# Patient Record
Sex: Female | Born: 1955
Health system: Southern US, Community
[De-identification: ages and names within clinical notes are randomized; demographics above are authoritative.]

## PROBLEM LIST (undated history)

## (undated) DIAGNOSIS — R319 Hematuria, unspecified: Secondary | ICD-10-CM

## (undated) DIAGNOSIS — J45909 Unspecified asthma, uncomplicated: Secondary | ICD-10-CM

## (undated) DIAGNOSIS — E119 Type 2 diabetes mellitus without complications: Secondary | ICD-10-CM

## (undated) DIAGNOSIS — R011 Cardiac murmur, unspecified: Secondary | ICD-10-CM

## (undated) DIAGNOSIS — I447 Left bundle-branch block, unspecified: Secondary | ICD-10-CM

## (undated) DIAGNOSIS — R0609 Other forms of dyspnea: Secondary | ICD-10-CM

## (undated) DIAGNOSIS — M5010 Cervical disc disorder with radiculopathy, unspecified cervical region: Secondary | ICD-10-CM

## (undated) DIAGNOSIS — B001 Herpesviral vesicular dermatitis: Secondary | ICD-10-CM

## (undated) DIAGNOSIS — Z87442 Personal history of urinary calculi: Secondary | ICD-10-CM

## (undated) DIAGNOSIS — M1990 Unspecified osteoarthritis, unspecified site: Secondary | ICD-10-CM

## (undated) DIAGNOSIS — K449 Diaphragmatic hernia without obstruction or gangrene: Secondary | ICD-10-CM

## (undated) DIAGNOSIS — I499 Cardiac arrhythmia, unspecified: Secondary | ICD-10-CM

## (undated) DIAGNOSIS — G2581 Restless legs syndrome: Secondary | ICD-10-CM

## (undated) DIAGNOSIS — F419 Anxiety disorder, unspecified: Secondary | ICD-10-CM

## (undated) DIAGNOSIS — G4700 Insomnia, unspecified: Secondary | ICD-10-CM

## (undated) DIAGNOSIS — M4802 Spinal stenosis, cervical region: Secondary | ICD-10-CM

## (undated) DIAGNOSIS — K219 Gastro-esophageal reflux disease without esophagitis: Secondary | ICD-10-CM

## (undated) DIAGNOSIS — J449 Chronic obstructive pulmonary disease, unspecified: Secondary | ICD-10-CM

## (undated) DIAGNOSIS — B373 Candidiasis of vulva and vagina: Secondary | ICD-10-CM

## (undated) DIAGNOSIS — F32A Depression, unspecified: Secondary | ICD-10-CM

## (undated) DIAGNOSIS — F1190 Opioid use, unspecified, uncomplicated: Secondary | ICD-10-CM

## (undated) DIAGNOSIS — G992 Myelopathy in diseases classified elsewhere: Secondary | ICD-10-CM

## (undated) DIAGNOSIS — G43909 Migraine, unspecified, not intractable, without status migrainosus: Secondary | ICD-10-CM

## (undated) DIAGNOSIS — R109 Unspecified abdominal pain: Secondary | ICD-10-CM

## (undated) DIAGNOSIS — M8580 Other specified disorders of bone density and structure, unspecified site: Secondary | ICD-10-CM

## (undated) DIAGNOSIS — R002 Palpitations: Secondary | ICD-10-CM

## (undated) DIAGNOSIS — B3731 Acute candidiasis of vulva and vagina: Secondary | ICD-10-CM

## (undated) DIAGNOSIS — G894 Chronic pain syndrome: Secondary | ICD-10-CM

## (undated) DIAGNOSIS — E162 Hypoglycemia, unspecified: Secondary | ICD-10-CM

## (undated) DIAGNOSIS — R7303 Prediabetes: Secondary | ICD-10-CM

## (undated) DIAGNOSIS — I10 Essential (primary) hypertension: Secondary | ICD-10-CM

## (undated) DIAGNOSIS — D649 Anemia, unspecified: Secondary | ICD-10-CM

## (undated) DIAGNOSIS — F329 Major depressive disorder, single episode, unspecified: Secondary | ICD-10-CM

## (undated) DIAGNOSIS — N200 Calculus of kidney: Secondary | ICD-10-CM

## (undated) HISTORY — DX: Unspecified abdominal pain: R10.9

## (undated) HISTORY — PX: LITHOTRIPSY: SUR834

## (undated) HISTORY — PX: BREAST SURGERY: SHX581

## (undated) HISTORY — DX: Hematuria, unspecified: R31.9

## (undated) HISTORY — DX: Depression, unspecified: F32.A

## (undated) HISTORY — PX: URETEROSCOPY: SHX842

## (undated) HISTORY — PX: GUM SURGERY: SHX658

## (undated) HISTORY — DX: Gastro-esophageal reflux disease without esophagitis: K21.9

## (undated) HISTORY — PX: TONSILLECTOMY: SUR1361

## (undated) HISTORY — DX: Candidiasis of vulva and vagina: B37.3

## (undated) HISTORY — DX: Major depressive disorder, single episode, unspecified: F32.9

## (undated) HISTORY — PX: JOINT REPLACEMENT: SHX530

## (undated) HISTORY — DX: Acute candidiasis of vulva and vagina: B37.31

## (undated) HISTORY — DX: Type 2 diabetes mellitus without complications: E11.9

## (undated) HISTORY — DX: Cardiac arrhythmia, unspecified: I49.9

## (undated) HISTORY — PX: ABDOMINAL HYSTERECTOMY: SHX81

## (undated) HISTORY — PX: FRACTURE SURGERY: SHX138

## (undated) HISTORY — PX: CARDIAC ELECTROPHYSIOLOGY STUDY AND ABLATION: SHX1294

## (undated) HISTORY — PX: BACK SURGERY: SHX140

## (undated) HISTORY — PX: LAPAROSCOPIC HYSTERECTOMY: SHX1926

## (undated) HISTORY — PX: CORNEAL TRANSPLANT: SHX108

---

## 1988-03-23 HISTORY — PX: APPENDECTOMY: SHX54

## 1988-03-23 HISTORY — PX: CHOLECYSTECTOMY: SHX55

## 1997-08-02 ENCOUNTER — Emergency Department (HOSPITAL_COMMUNITY): Admission: EM | Admit: 1997-08-02 | Discharge: 1997-08-02 | Payer: Self-pay | Admitting: Emergency Medicine

## 1999-05-13 ENCOUNTER — Inpatient Hospital Stay (HOSPITAL_COMMUNITY): Admission: AD | Admit: 1999-05-13 | Discharge: 1999-05-21 | Payer: Self-pay | Admitting: *Deleted

## 2000-10-03 ENCOUNTER — Emergency Department (HOSPITAL_COMMUNITY): Admission: EM | Admit: 2000-10-03 | Discharge: 2000-10-03 | Payer: Self-pay | Admitting: Emergency Medicine

## 2003-12-22 ENCOUNTER — Ambulatory Visit: Payer: Self-pay | Admitting: Internal Medicine

## 2003-12-31 ENCOUNTER — Emergency Department: Payer: Self-pay | Admitting: Emergency Medicine

## 2003-12-31 ENCOUNTER — Other Ambulatory Visit: Payer: Self-pay

## 2004-01-15 ENCOUNTER — Ambulatory Visit: Payer: Self-pay

## 2004-01-22 ENCOUNTER — Ambulatory Visit: Payer: Self-pay | Admitting: Internal Medicine

## 2004-06-10 ENCOUNTER — Ambulatory Visit: Payer: Self-pay | Admitting: Unknown Physician Specialty

## 2004-07-09 ENCOUNTER — Ambulatory Visit: Payer: Self-pay

## 2004-08-14 ENCOUNTER — Ambulatory Visit: Payer: Self-pay

## 2004-09-03 ENCOUNTER — Ambulatory Visit: Payer: Self-pay

## 2004-09-05 ENCOUNTER — Ambulatory Visit: Payer: Self-pay

## 2004-11-28 ENCOUNTER — Ambulatory Visit: Payer: Self-pay | Admitting: Pain Medicine

## 2004-12-02 ENCOUNTER — Ambulatory Visit: Payer: Self-pay | Admitting: Pain Medicine

## 2004-12-17 ENCOUNTER — Ambulatory Visit: Payer: Self-pay | Admitting: Physician Assistant

## 2004-12-23 ENCOUNTER — Ambulatory Visit: Payer: Self-pay | Admitting: Pain Medicine

## 2005-01-06 ENCOUNTER — Ambulatory Visit: Payer: Self-pay | Admitting: Pain Medicine

## 2005-01-21 ENCOUNTER — Ambulatory Visit: Payer: Self-pay | Admitting: Physician Assistant

## 2005-01-27 ENCOUNTER — Ambulatory Visit: Payer: Self-pay | Admitting: Pain Medicine

## 2005-02-16 ENCOUNTER — Ambulatory Visit: Payer: Self-pay | Admitting: Physician Assistant

## 2005-04-01 ENCOUNTER — Ambulatory Visit: Payer: Self-pay | Admitting: Physician Assistant

## 2005-04-03 ENCOUNTER — Ambulatory Visit: Payer: Self-pay | Admitting: Pain Medicine

## 2005-04-26 ENCOUNTER — Emergency Department: Payer: Self-pay | Admitting: Internal Medicine

## 2005-04-27 ENCOUNTER — Emergency Department: Payer: Self-pay | Admitting: Unknown Physician Specialty

## 2005-05-14 ENCOUNTER — Ambulatory Visit: Payer: Self-pay | Admitting: Physician Assistant

## 2005-05-21 ENCOUNTER — Ambulatory Visit: Payer: Self-pay | Admitting: Pain Medicine

## 2005-06-10 ENCOUNTER — Ambulatory Visit: Payer: Self-pay | Admitting: Physician Assistant

## 2005-06-15 ENCOUNTER — Emergency Department: Payer: Self-pay | Admitting: Unknown Physician Specialty

## 2005-06-24 ENCOUNTER — Ambulatory Visit: Payer: Self-pay | Admitting: Internal Medicine

## 2005-07-15 ENCOUNTER — Other Ambulatory Visit: Payer: Self-pay

## 2005-07-15 ENCOUNTER — Ambulatory Visit: Payer: Self-pay | Admitting: Urology

## 2005-07-16 ENCOUNTER — Ambulatory Visit: Payer: Self-pay | Admitting: Urology

## 2005-07-27 ENCOUNTER — Ambulatory Visit: Payer: Self-pay | Admitting: Urology

## 2005-07-30 ENCOUNTER — Ambulatory Visit: Payer: Self-pay | Admitting: Urology

## 2005-09-08 ENCOUNTER — Ambulatory Visit: Payer: Self-pay | Admitting: Urology

## 2005-10-13 ENCOUNTER — Ambulatory Visit: Payer: Self-pay

## 2005-11-02 ENCOUNTER — Inpatient Hospital Stay: Payer: Self-pay | Admitting: Internal Medicine

## 2005-11-25 ENCOUNTER — Ambulatory Visit: Payer: Self-pay

## 2005-12-02 ENCOUNTER — Ambulatory Visit: Payer: Self-pay | Admitting: Physician Assistant

## 2006-01-19 ENCOUNTER — Ambulatory Visit: Payer: Self-pay | Admitting: Physician Assistant

## 2006-02-15 ENCOUNTER — Ambulatory Visit: Payer: Self-pay | Admitting: Physician Assistant

## 2006-04-16 ENCOUNTER — Ambulatory Visit: Payer: Self-pay | Admitting: Internal Medicine

## 2006-05-25 ENCOUNTER — Ambulatory Visit: Payer: Self-pay | Admitting: Physician Assistant

## 2006-06-08 ENCOUNTER — Ambulatory Visit: Payer: Self-pay | Admitting: Pain Medicine

## 2006-06-23 ENCOUNTER — Ambulatory Visit: Payer: Self-pay | Admitting: Physician Assistant

## 2006-06-27 ENCOUNTER — Emergency Department: Payer: Self-pay

## 2006-06-27 ENCOUNTER — Other Ambulatory Visit: Payer: Self-pay

## 2006-08-17 ENCOUNTER — Ambulatory Visit: Payer: Self-pay | Admitting: Physician Assistant

## 2006-09-14 ENCOUNTER — Ambulatory Visit: Payer: Self-pay | Admitting: Physician Assistant

## 2006-10-29 ENCOUNTER — Ambulatory Visit: Payer: Self-pay | Admitting: Physician Assistant

## 2006-11-25 ENCOUNTER — Ambulatory Visit: Payer: Self-pay | Admitting: Physician Assistant

## 2006-12-28 ENCOUNTER — Ambulatory Visit: Payer: Self-pay | Admitting: Pain Medicine

## 2007-01-12 ENCOUNTER — Ambulatory Visit: Payer: Self-pay | Admitting: Internal Medicine

## 2007-02-14 ENCOUNTER — Ambulatory Visit: Payer: Self-pay | Admitting: Physician Assistant

## 2007-03-02 ENCOUNTER — Ambulatory Visit: Payer: Self-pay | Admitting: Urology

## 2007-03-09 ENCOUNTER — Ambulatory Visit: Payer: Self-pay | Admitting: Urology

## 2007-03-09 ENCOUNTER — Other Ambulatory Visit: Payer: Self-pay

## 2007-03-10 ENCOUNTER — Ambulatory Visit: Payer: Self-pay | Admitting: Urology

## 2007-06-08 ENCOUNTER — Other Ambulatory Visit: Payer: Self-pay

## 2007-06-08 ENCOUNTER — Ambulatory Visit: Payer: Self-pay | Admitting: Urology

## 2007-06-09 ENCOUNTER — Ambulatory Visit: Payer: Self-pay | Admitting: Urology

## 2007-08-31 ENCOUNTER — Ambulatory Visit: Payer: Self-pay | Admitting: Urology

## 2008-02-02 ENCOUNTER — Ambulatory Visit: Payer: Self-pay | Admitting: Internal Medicine

## 2008-02-14 ENCOUNTER — Ambulatory Visit: Payer: Self-pay | Admitting: Internal Medicine

## 2008-04-20 ENCOUNTER — Ambulatory Visit: Payer: Self-pay | Admitting: Specialist

## 2008-05-09 ENCOUNTER — Ambulatory Visit: Payer: Self-pay | Admitting: Urology

## 2008-05-28 ENCOUNTER — Ambulatory Visit: Payer: Self-pay | Admitting: Urology

## 2008-06-06 ENCOUNTER — Inpatient Hospital Stay: Payer: Self-pay | Admitting: Internal Medicine

## 2008-07-02 ENCOUNTER — Ambulatory Visit: Payer: Self-pay | Admitting: Urology

## 2008-07-12 ENCOUNTER — Ambulatory Visit: Payer: Self-pay | Admitting: Gastroenterology

## 2008-11-17 ENCOUNTER — Emergency Department: Payer: Self-pay | Admitting: Unknown Physician Specialty

## 2008-11-24 ENCOUNTER — Emergency Department: Payer: Self-pay | Admitting: Emergency Medicine

## 2008-11-25 ENCOUNTER — Emergency Department: Payer: Self-pay | Admitting: Emergency Medicine

## 2009-01-22 ENCOUNTER — Ambulatory Visit: Payer: Self-pay | Admitting: Urology

## 2009-06-14 ENCOUNTER — Emergency Department: Payer: Self-pay | Admitting: Emergency Medicine

## 2009-07-01 ENCOUNTER — Emergency Department: Payer: Self-pay | Admitting: Emergency Medicine

## 2009-07-08 ENCOUNTER — Emergency Department: Payer: Self-pay

## 2009-08-26 ENCOUNTER — Emergency Department: Payer: Self-pay | Admitting: Emergency Medicine

## 2009-09-19 ENCOUNTER — Emergency Department: Payer: Self-pay | Admitting: Emergency Medicine

## 2009-10-06 ENCOUNTER — Emergency Department: Payer: Self-pay | Admitting: Emergency Medicine

## 2009-10-29 ENCOUNTER — Ambulatory Visit: Payer: Self-pay | Admitting: Internal Medicine

## 2009-12-13 ENCOUNTER — Ambulatory Visit: Payer: Self-pay | Admitting: Neurology

## 2010-02-04 ENCOUNTER — Ambulatory Visit: Payer: Self-pay | Admitting: Internal Medicine

## 2010-02-11 ENCOUNTER — Emergency Department: Payer: Self-pay

## 2010-03-24 ENCOUNTER — Observation Stay: Payer: Self-pay | Admitting: Internal Medicine

## 2010-03-25 ENCOUNTER — Observation Stay: Payer: Self-pay | Admitting: Internal Medicine

## 2010-03-29 ENCOUNTER — Emergency Department: Payer: Self-pay | Admitting: Emergency Medicine

## 2010-05-11 ENCOUNTER — Emergency Department: Payer: Self-pay | Admitting: Emergency Medicine

## 2010-07-07 DIAGNOSIS — K2270 Barrett's esophagus without dysplasia: Secondary | ICD-10-CM | POA: Insufficient documentation

## 2010-07-30 ENCOUNTER — Emergency Department: Payer: Self-pay | Admitting: Emergency Medicine

## 2010-08-06 ENCOUNTER — Emergency Department: Payer: Self-pay | Admitting: Emergency Medicine

## 2010-10-01 ENCOUNTER — Emergency Department: Payer: Self-pay | Admitting: Emergency Medicine

## 2010-10-08 ENCOUNTER — Ambulatory Visit: Payer: Self-pay | Admitting: Internal Medicine

## 2010-10-27 DIAGNOSIS — K449 Diaphragmatic hernia without obstruction or gangrene: Secondary | ICD-10-CM | POA: Insufficient documentation

## 2010-12-27 ENCOUNTER — Emergency Department: Payer: Self-pay | Admitting: Emergency Medicine

## 2011-01-11 ENCOUNTER — Emergency Department: Payer: Self-pay | Admitting: Unknown Physician Specialty

## 2011-01-14 ENCOUNTER — Emergency Department: Payer: Self-pay | Admitting: Internal Medicine

## 2011-01-20 ENCOUNTER — Ambulatory Visit: Payer: Self-pay | Admitting: Urology

## 2011-02-19 ENCOUNTER — Emergency Department: Payer: Self-pay | Admitting: Internal Medicine

## 2011-03-30 DIAGNOSIS — S82009A Unspecified fracture of unspecified patella, initial encounter for closed fracture: Secondary | ICD-10-CM | POA: Diagnosis not present

## 2011-03-30 DIAGNOSIS — M1710 Unilateral primary osteoarthritis, unspecified knee: Secondary | ICD-10-CM | POA: Diagnosis not present

## 2011-04-01 DIAGNOSIS — M1710 Unilateral primary osteoarthritis, unspecified knee: Secondary | ICD-10-CM | POA: Diagnosis not present

## 2011-04-03 DIAGNOSIS — S82009A Unspecified fracture of unspecified patella, initial encounter for closed fracture: Secondary | ICD-10-CM | POA: Diagnosis not present

## 2011-04-03 DIAGNOSIS — M1710 Unilateral primary osteoarthritis, unspecified knee: Secondary | ICD-10-CM | POA: Diagnosis not present

## 2011-04-06 DIAGNOSIS — M1710 Unilateral primary osteoarthritis, unspecified knee: Secondary | ICD-10-CM | POA: Diagnosis not present

## 2011-04-06 DIAGNOSIS — S82009A Unspecified fracture of unspecified patella, initial encounter for closed fracture: Secondary | ICD-10-CM | POA: Diagnosis not present

## 2011-04-08 DIAGNOSIS — M1710 Unilateral primary osteoarthritis, unspecified knee: Secondary | ICD-10-CM | POA: Diagnosis not present

## 2011-04-08 DIAGNOSIS — S82009A Unspecified fracture of unspecified patella, initial encounter for closed fracture: Secondary | ICD-10-CM | POA: Diagnosis not present

## 2011-04-10 DIAGNOSIS — M1710 Unilateral primary osteoarthritis, unspecified knee: Secondary | ICD-10-CM | POA: Diagnosis not present

## 2011-04-15 DIAGNOSIS — M1710 Unilateral primary osteoarthritis, unspecified knee: Secondary | ICD-10-CM | POA: Diagnosis not present

## 2011-04-20 DIAGNOSIS — S82009A Unspecified fracture of unspecified patella, initial encounter for closed fracture: Secondary | ICD-10-CM | POA: Diagnosis not present

## 2011-04-20 DIAGNOSIS — M1710 Unilateral primary osteoarthritis, unspecified knee: Secondary | ICD-10-CM | POA: Diagnosis not present

## 2011-04-24 DIAGNOSIS — M1710 Unilateral primary osteoarthritis, unspecified knee: Secondary | ICD-10-CM | POA: Diagnosis not present

## 2011-04-24 DIAGNOSIS — S82009A Unspecified fracture of unspecified patella, initial encounter for closed fracture: Secondary | ICD-10-CM | POA: Diagnosis not present

## 2011-04-27 DIAGNOSIS — M1710 Unilateral primary osteoarthritis, unspecified knee: Secondary | ICD-10-CM | POA: Diagnosis not present

## 2011-04-27 DIAGNOSIS — S82009A Unspecified fracture of unspecified patella, initial encounter for closed fracture: Secondary | ICD-10-CM | POA: Diagnosis not present

## 2011-04-28 DIAGNOSIS — F411 Generalized anxiety disorder: Secondary | ICD-10-CM | POA: Diagnosis not present

## 2011-04-28 DIAGNOSIS — M542 Cervicalgia: Secondary | ICD-10-CM | POA: Diagnosis not present

## 2011-04-28 DIAGNOSIS — E559 Vitamin D deficiency, unspecified: Secondary | ICD-10-CM | POA: Diagnosis not present

## 2011-04-28 DIAGNOSIS — G43009 Migraine without aura, not intractable, without status migrainosus: Secondary | ICD-10-CM | POA: Diagnosis not present

## 2011-04-28 DIAGNOSIS — E782 Mixed hyperlipidemia: Secondary | ICD-10-CM | POA: Diagnosis not present

## 2011-04-28 DIAGNOSIS — I479 Paroxysmal tachycardia, unspecified: Secondary | ICD-10-CM | POA: Diagnosis not present

## 2011-04-28 DIAGNOSIS — I10 Essential (primary) hypertension: Secondary | ICD-10-CM | POA: Diagnosis not present

## 2011-06-04 DIAGNOSIS — E782 Mixed hyperlipidemia: Secondary | ICD-10-CM | POA: Diagnosis not present

## 2011-06-04 DIAGNOSIS — F5102 Adjustment insomnia: Secondary | ICD-10-CM | POA: Diagnosis not present

## 2011-06-04 DIAGNOSIS — M159 Polyosteoarthritis, unspecified: Secondary | ICD-10-CM | POA: Diagnosis not present

## 2011-06-04 DIAGNOSIS — F411 Generalized anxiety disorder: Secondary | ICD-10-CM | POA: Diagnosis not present

## 2011-06-04 DIAGNOSIS — G479 Sleep disorder, unspecified: Secondary | ICD-10-CM | POA: Diagnosis not present

## 2011-06-10 DIAGNOSIS — R062 Wheezing: Secondary | ICD-10-CM | POA: Diagnosis not present

## 2011-06-10 DIAGNOSIS — R0602 Shortness of breath: Secondary | ICD-10-CM | POA: Diagnosis not present

## 2011-06-10 DIAGNOSIS — J441 Chronic obstructive pulmonary disease with (acute) exacerbation: Secondary | ICD-10-CM | POA: Diagnosis not present

## 2011-06-10 DIAGNOSIS — J45901 Unspecified asthma with (acute) exacerbation: Secondary | ICD-10-CM | POA: Diagnosis not present

## 2011-06-29 DIAGNOSIS — K449 Diaphragmatic hernia without obstruction or gangrene: Secondary | ICD-10-CM | POA: Diagnosis not present

## 2011-06-29 DIAGNOSIS — K219 Gastro-esophageal reflux disease without esophagitis: Secondary | ICD-10-CM | POA: Diagnosis not present

## 2011-06-29 DIAGNOSIS — Z09 Encounter for follow-up examination after completed treatment for conditions other than malignant neoplasm: Secondary | ICD-10-CM | POA: Diagnosis not present

## 2011-07-06 DIAGNOSIS — G43009 Migraine without aura, not intractable, without status migrainosus: Secondary | ICD-10-CM | POA: Diagnosis not present

## 2011-07-06 DIAGNOSIS — K2270 Barrett's esophagus without dysplasia: Secondary | ICD-10-CM | POA: Diagnosis not present

## 2011-07-06 DIAGNOSIS — R5381 Other malaise: Secondary | ICD-10-CM | POA: Diagnosis not present

## 2011-07-06 DIAGNOSIS — J449 Chronic obstructive pulmonary disease, unspecified: Secondary | ICD-10-CM | POA: Diagnosis not present

## 2011-07-06 DIAGNOSIS — F3289 Other specified depressive episodes: Secondary | ICD-10-CM | POA: Diagnosis not present

## 2011-07-06 DIAGNOSIS — I10 Essential (primary) hypertension: Secondary | ICD-10-CM | POA: Diagnosis not present

## 2011-07-06 DIAGNOSIS — E559 Vitamin D deficiency, unspecified: Secondary | ICD-10-CM | POA: Diagnosis not present

## 2011-07-06 DIAGNOSIS — F329 Major depressive disorder, single episode, unspecified: Secondary | ICD-10-CM | POA: Diagnosis not present

## 2011-08-31 DIAGNOSIS — M1710 Unilateral primary osteoarthritis, unspecified knee: Secondary | ICD-10-CM | POA: Diagnosis not present

## 2011-09-02 DIAGNOSIS — R300 Dysuria: Secondary | ICD-10-CM | POA: Diagnosis not present

## 2011-09-02 DIAGNOSIS — N390 Urinary tract infection, site not specified: Secondary | ICD-10-CM | POA: Diagnosis not present

## 2011-09-30 DIAGNOSIS — M1710 Unilateral primary osteoarthritis, unspecified knee: Secondary | ICD-10-CM | POA: Diagnosis not present

## 2011-09-30 DIAGNOSIS — IMO0002 Reserved for concepts with insufficient information to code with codable children: Secondary | ICD-10-CM | POA: Diagnosis not present

## 2011-10-01 ENCOUNTER — Ambulatory Visit: Payer: Self-pay | Admitting: Orthopedic Surgery

## 2011-10-01 DIAGNOSIS — M25469 Effusion, unspecified knee: Secondary | ICD-10-CM | POA: Diagnosis not present

## 2011-10-01 DIAGNOSIS — M2390 Unspecified internal derangement of unspecified knee: Secondary | ICD-10-CM | POA: Diagnosis not present

## 2011-10-01 DIAGNOSIS — M25569 Pain in unspecified knee: Secondary | ICD-10-CM | POA: Diagnosis not present

## 2011-10-20 ENCOUNTER — Ambulatory Visit: Payer: Self-pay | Admitting: Orthopedic Surgery

## 2011-10-20 DIAGNOSIS — I10 Essential (primary) hypertension: Secondary | ICD-10-CM | POA: Diagnosis not present

## 2011-10-20 LAB — BASIC METABOLIC PANEL
Anion Gap: 8 (ref 7–16)
BUN: 11 mg/dL (ref 7–18)
Calcium, Total: 8.9 mg/dL (ref 8.5–10.1)
Chloride: 103 mmol/L (ref 98–107)
Co2: 31 mmol/L (ref 21–32)
Creatinine: 0.56 mg/dL — ABNORMAL LOW (ref 0.60–1.30)
EGFR (African American): >60
EGFR (Non-African Amer.): >60
Glucose: 78 mg/dL (ref 65–99)
Osmolality: 281 (ref 275–301)
Potassium: 3.7 mmol/L (ref 3.5–5.1)
Sodium: 142 mmol/L (ref 136–145)

## 2011-10-20 LAB — APTT: Activated PTT: 28.8 secs (ref 23.6–35.9)

## 2011-10-20 LAB — MRSA PCR SCREENING

## 2011-10-20 LAB — PROTIME-INR
INR: 0.9
Prothrombin Time: 12.6 secs (ref 11.5–14.7)

## 2011-10-26 ENCOUNTER — Ambulatory Visit: Payer: Self-pay | Admitting: Anesthesiology

## 2011-10-26 LAB — CBC
HCT: 36.5 % (ref 35.0–47.0)
HGB: 12.1 g/dL (ref 12.0–16.0)
MCH: 28.5 pg (ref 26.0–34.0)
MCHC: 33 g/dL (ref 32.0–36.0)
MCV: 86 fL (ref 80–100)
Platelet: 267 10*3/uL (ref 150–440)
RBC: 4.23 10*6/uL (ref 3.80–5.20)
RDW: 14.6 % — ABNORMAL HIGH (ref 11.5–14.5)
WBC: 11.2 10*3/uL — ABNORMAL HIGH (ref 3.6–11.0)

## 2011-10-26 LAB — SEDIMENTATION RATE: Erythrocyte Sed Rate: 8 mm/hr (ref 0–30)

## 2011-10-29 ENCOUNTER — Inpatient Hospital Stay: Payer: Self-pay

## 2011-10-29 DIAGNOSIS — Z79899 Other long term (current) drug therapy: Secondary | ICD-10-CM | POA: Diagnosis not present

## 2011-10-29 DIAGNOSIS — G2581 Restless legs syndrome: Secondary | ICD-10-CM | POA: Diagnosis present

## 2011-10-29 DIAGNOSIS — M810 Age-related osteoporosis without current pathological fracture: Secondary | ICD-10-CM | POA: Diagnosis present

## 2011-10-29 DIAGNOSIS — D62 Acute posthemorrhagic anemia: Secondary | ICD-10-CM | POA: Diagnosis not present

## 2011-10-29 DIAGNOSIS — F329 Major depressive disorder, single episode, unspecified: Secondary | ICD-10-CM | POA: Diagnosis present

## 2011-10-29 DIAGNOSIS — G43909 Migraine, unspecified, not intractable, without status migrainosus: Secondary | ICD-10-CM | POA: Diagnosis present

## 2011-10-29 DIAGNOSIS — Z5189 Encounter for other specified aftercare: Secondary | ICD-10-CM | POA: Diagnosis not present

## 2011-10-29 DIAGNOSIS — Z87442 Personal history of urinary calculi: Secondary | ICD-10-CM | POA: Diagnosis not present

## 2011-10-29 DIAGNOSIS — M25569 Pain in unspecified knee: Secondary | ICD-10-CM | POA: Diagnosis not present

## 2011-10-29 DIAGNOSIS — IMO0002 Reserved for concepts with insufficient information to code with codable children: Secondary | ICD-10-CM | POA: Diagnosis not present

## 2011-10-29 DIAGNOSIS — Z8614 Personal history of Methicillin resistant Staphylococcus aureus infection: Secondary | ICD-10-CM | POA: Diagnosis not present

## 2011-10-29 DIAGNOSIS — Z8619 Personal history of other infectious and parasitic diseases: Secondary | ICD-10-CM | POA: Diagnosis not present

## 2011-10-29 DIAGNOSIS — M1710 Unilateral primary osteoarthritis, unspecified knee: Secondary | ICD-10-CM | POA: Diagnosis not present

## 2011-10-29 DIAGNOSIS — Z881 Allergy status to other antibiotic agents status: Secondary | ICD-10-CM | POA: Diagnosis not present

## 2011-10-29 DIAGNOSIS — J45909 Unspecified asthma, uncomplicated: Secondary | ICD-10-CM | POA: Diagnosis present

## 2011-10-29 DIAGNOSIS — I10 Essential (primary) hypertension: Secondary | ICD-10-CM | POA: Diagnosis present

## 2011-10-29 DIAGNOSIS — F411 Generalized anxiety disorder: Secondary | ICD-10-CM | POA: Diagnosis present

## 2011-10-29 DIAGNOSIS — Z9889 Other specified postprocedural states: Secondary | ICD-10-CM | POA: Diagnosis not present

## 2011-10-29 DIAGNOSIS — Z888 Allergy status to other drugs, medicaments and biological substances status: Secondary | ICD-10-CM | POA: Diagnosis not present

## 2011-10-29 DIAGNOSIS — Z886 Allergy status to analgesic agent status: Secondary | ICD-10-CM | POA: Diagnosis not present

## 2011-10-29 DIAGNOSIS — M79609 Pain in unspecified limb: Secondary | ICD-10-CM | POA: Diagnosis not present

## 2011-10-29 DIAGNOSIS — G8918 Other acute postprocedural pain: Secondary | ICD-10-CM | POA: Diagnosis not present

## 2011-10-29 DIAGNOSIS — F3289 Other specified depressive episodes: Secondary | ICD-10-CM | POA: Diagnosis present

## 2011-10-29 DIAGNOSIS — R000 Tachycardia, unspecified: Secondary | ICD-10-CM | POA: Diagnosis not present

## 2011-10-29 DIAGNOSIS — Z471 Aftercare following joint replacement surgery: Secondary | ICD-10-CM | POA: Diagnosis not present

## 2011-10-29 DIAGNOSIS — M069 Rheumatoid arthritis, unspecified: Secondary | ICD-10-CM | POA: Diagnosis present

## 2011-10-29 DIAGNOSIS — Z96659 Presence of unspecified artificial knee joint: Secondary | ICD-10-CM | POA: Diagnosis not present

## 2011-10-29 LAB — CREATININE, SERUM
Creatinine: 0.63 mg/dL (ref 0.60–1.30)
EGFR (African American): >60
EGFR (Non-African Amer.): >60

## 2011-10-30 LAB — BASIC METABOLIC PANEL
Anion Gap: 7 (ref 7–16)
BUN: 10 mg/dL (ref 7–18)
Calcium, Total: 7.3 mg/dL — ABNORMAL LOW (ref 8.5–10.1)
Chloride: 104 mmol/L (ref 98–107)
Co2: 27 mmol/L (ref 21–32)
Creatinine: 0.64 mg/dL (ref 0.60–1.30)
EGFR (African American): >60
EGFR (Non-African Amer.): >60
Glucose: 105 mg/dL — ABNORMAL HIGH (ref 65–99)
Osmolality: 275 (ref 275–301)
Potassium: 3.4 mmol/L — ABNORMAL LOW (ref 3.5–5.1)
Sodium: 138 mmol/L (ref 136–145)

## 2011-10-30 LAB — HEMOGLOBIN: HGB: 9.4 g/dL — ABNORMAL LOW (ref 12.0–16.0)

## 2011-10-30 LAB — PLATELET COUNT: Platelet: 213 10*3/uL (ref 150–440)

## 2011-10-31 LAB — HEMOGLOBIN: HGB: 9.3 g/dL — ABNORMAL LOW (ref 12.0–16.0)

## 2011-11-02 DIAGNOSIS — Z79899 Other long term (current) drug therapy: Secondary | ICD-10-CM | POA: Diagnosis not present

## 2011-11-02 DIAGNOSIS — Z833 Family history of diabetes mellitus: Secondary | ICD-10-CM | POA: Diagnosis not present

## 2011-11-02 DIAGNOSIS — R000 Tachycardia, unspecified: Secondary | ICD-10-CM | POA: Diagnosis not present

## 2011-11-02 DIAGNOSIS — M069 Rheumatoid arthritis, unspecified: Secondary | ICD-10-CM | POA: Diagnosis not present

## 2011-11-02 DIAGNOSIS — Z90710 Acquired absence of both cervix and uterus: Secondary | ICD-10-CM | POA: Diagnosis not present

## 2011-11-02 DIAGNOSIS — G43909 Migraine, unspecified, not intractable, without status migrainosus: Secondary | ICD-10-CM | POA: Diagnosis not present

## 2011-11-02 DIAGNOSIS — E871 Hypo-osmolality and hyponatremia: Secondary | ICD-10-CM | POA: Diagnosis present

## 2011-11-02 DIAGNOSIS — R222 Localized swelling, mass and lump, trunk: Secondary | ICD-10-CM | POA: Diagnosis not present

## 2011-11-02 DIAGNOSIS — Z87442 Personal history of urinary calculi: Secondary | ICD-10-CM | POA: Diagnosis not present

## 2011-11-02 DIAGNOSIS — G43809 Other migraine, not intractable, without status migrainosus: Secondary | ICD-10-CM | POA: Diagnosis present

## 2011-11-02 DIAGNOSIS — Z91018 Allergy to other foods: Secondary | ICD-10-CM | POA: Diagnosis not present

## 2011-11-02 DIAGNOSIS — R5381 Other malaise: Secondary | ICD-10-CM | POA: Diagnosis present

## 2011-11-02 DIAGNOSIS — J45901 Unspecified asthma with (acute) exacerbation: Secondary | ICD-10-CM | POA: Diagnosis present

## 2011-11-02 DIAGNOSIS — E46 Unspecified protein-calorie malnutrition: Secondary | ICD-10-CM | POA: Diagnosis present

## 2011-11-02 DIAGNOSIS — Z96659 Presence of unspecified artificial knee joint: Secondary | ICD-10-CM | POA: Diagnosis not present

## 2011-11-02 DIAGNOSIS — K449 Diaphragmatic hernia without obstruction or gangrene: Secondary | ICD-10-CM | POA: Diagnosis not present

## 2011-11-02 DIAGNOSIS — K254 Chronic or unspecified gastric ulcer with hemorrhage: Secondary | ICD-10-CM | POA: Diagnosis present

## 2011-11-02 DIAGNOSIS — D649 Anemia, unspecified: Secondary | ICD-10-CM | POA: Diagnosis not present

## 2011-11-02 DIAGNOSIS — I10 Essential (primary) hypertension: Secondary | ICD-10-CM | POA: Diagnosis not present

## 2011-11-02 DIAGNOSIS — F411 Generalized anxiety disorder: Secondary | ICD-10-CM | POA: Diagnosis present

## 2011-11-02 DIAGNOSIS — F329 Major depressive disorder, single episode, unspecified: Secondary | ICD-10-CM | POA: Diagnosis present

## 2011-11-02 DIAGNOSIS — M810 Age-related osteoporosis without current pathological fracture: Secondary | ICD-10-CM | POA: Diagnosis not present

## 2011-11-02 DIAGNOSIS — M1710 Unilateral primary osteoarthritis, unspecified knee: Secondary | ICD-10-CM | POA: Diagnosis present

## 2011-11-02 DIAGNOSIS — R0609 Other forms of dyspnea: Secondary | ICD-10-CM | POA: Diagnosis not present

## 2011-11-02 DIAGNOSIS — R0989 Other specified symptoms and signs involving the circulatory and respiratory systems: Secondary | ICD-10-CM | POA: Diagnosis not present

## 2011-11-02 DIAGNOSIS — IMO0002 Reserved for concepts with insufficient information to code with codable children: Secondary | ICD-10-CM | POA: Diagnosis present

## 2011-11-02 DIAGNOSIS — D62 Acute posthemorrhagic anemia: Secondary | ICD-10-CM | POA: Diagnosis not present

## 2011-11-02 DIAGNOSIS — R509 Fever, unspecified: Secondary | ICD-10-CM | POA: Diagnosis present

## 2011-11-02 DIAGNOSIS — F3289 Other specified depressive episodes: Secondary | ICD-10-CM | POA: Diagnosis present

## 2011-11-02 DIAGNOSIS — R0602 Shortness of breath: Secondary | ICD-10-CM | POA: Diagnosis not present

## 2011-11-02 DIAGNOSIS — Z471 Aftercare following joint replacement surgery: Secondary | ICD-10-CM | POA: Diagnosis not present

## 2011-11-02 DIAGNOSIS — Z9889 Other specified postprocedural states: Secondary | ICD-10-CM | POA: Diagnosis not present

## 2011-11-02 DIAGNOSIS — Z8249 Family history of ischemic heart disease and other diseases of the circulatory system: Secondary | ICD-10-CM | POA: Diagnosis not present

## 2011-11-02 DIAGNOSIS — J189 Pneumonia, unspecified organism: Secondary | ICD-10-CM | POA: Diagnosis present

## 2011-11-02 DIAGNOSIS — R062 Wheezing: Secondary | ICD-10-CM | POA: Diagnosis not present

## 2011-11-02 DIAGNOSIS — N390 Urinary tract infection, site not specified: Secondary | ICD-10-CM | POA: Diagnosis present

## 2011-11-02 DIAGNOSIS — Z5189 Encounter for other specified aftercare: Secondary | ICD-10-CM | POA: Diagnosis not present

## 2011-11-02 DIAGNOSIS — Z888 Allergy status to other drugs, medicaments and biological substances status: Secondary | ICD-10-CM | POA: Diagnosis not present

## 2011-11-02 LAB — PATHOLOGY REPORT

## 2011-11-05 ENCOUNTER — Inpatient Hospital Stay: Payer: Self-pay | Admitting: Internal Medicine

## 2011-11-05 DIAGNOSIS — R000 Tachycardia, unspecified: Secondary | ICD-10-CM | POA: Diagnosis not present

## 2011-11-05 DIAGNOSIS — R0609 Other forms of dyspnea: Secondary | ICD-10-CM | POA: Diagnosis not present

## 2011-11-05 DIAGNOSIS — G43809 Other migraine, not intractable, without status migrainosus: Secondary | ICD-10-CM | POA: Diagnosis present

## 2011-11-05 DIAGNOSIS — Z471 Aftercare following joint replacement surgery: Secondary | ICD-10-CM | POA: Diagnosis not present

## 2011-11-05 DIAGNOSIS — IMO0002 Reserved for concepts with insufficient information to code with codable children: Secondary | ICD-10-CM | POA: Diagnosis present

## 2011-11-05 DIAGNOSIS — J189 Pneumonia, unspecified organism: Secondary | ICD-10-CM | POA: Diagnosis not present

## 2011-11-05 DIAGNOSIS — Z5189 Encounter for other specified aftercare: Secondary | ICD-10-CM | POA: Diagnosis not present

## 2011-11-05 DIAGNOSIS — D62 Acute posthemorrhagic anemia: Secondary | ICD-10-CM | POA: Diagnosis not present

## 2011-11-05 DIAGNOSIS — M810 Age-related osteoporosis without current pathological fracture: Secondary | ICD-10-CM | POA: Diagnosis not present

## 2011-11-05 DIAGNOSIS — Z79899 Other long term (current) drug therapy: Secondary | ICD-10-CM | POA: Diagnosis not present

## 2011-11-05 DIAGNOSIS — K922 Gastrointestinal hemorrhage, unspecified: Secondary | ICD-10-CM | POA: Diagnosis not present

## 2011-11-05 DIAGNOSIS — Z833 Family history of diabetes mellitus: Secondary | ICD-10-CM | POA: Diagnosis not present

## 2011-11-05 DIAGNOSIS — K2970 Gastritis, unspecified, without bleeding: Secondary | ICD-10-CM | POA: Diagnosis not present

## 2011-11-05 DIAGNOSIS — F3289 Other specified depressive episodes: Secondary | ICD-10-CM | POA: Diagnosis present

## 2011-11-05 DIAGNOSIS — J45901 Unspecified asthma with (acute) exacerbation: Secondary | ICD-10-CM | POA: Diagnosis present

## 2011-11-05 DIAGNOSIS — R918 Other nonspecific abnormal finding of lung field: Secondary | ICD-10-CM | POA: Diagnosis not present

## 2011-11-05 DIAGNOSIS — E871 Hypo-osmolality and hyponatremia: Secondary | ICD-10-CM | POA: Diagnosis present

## 2011-11-05 DIAGNOSIS — Z87442 Personal history of urinary calculi: Secondary | ICD-10-CM | POA: Diagnosis not present

## 2011-11-05 DIAGNOSIS — J45909 Unspecified asthma, uncomplicated: Secondary | ICD-10-CM | POA: Diagnosis not present

## 2011-11-05 DIAGNOSIS — K254 Chronic or unspecified gastric ulcer with hemorrhage: Secondary | ICD-10-CM | POA: Diagnosis present

## 2011-11-05 DIAGNOSIS — N390 Urinary tract infection, site not specified: Secondary | ICD-10-CM | POA: Diagnosis present

## 2011-11-05 DIAGNOSIS — Z9889 Other specified postprocedural states: Secondary | ICD-10-CM | POA: Diagnosis not present

## 2011-11-05 DIAGNOSIS — R195 Other fecal abnormalities: Secondary | ICD-10-CM | POA: Diagnosis not present

## 2011-11-05 DIAGNOSIS — E46 Unspecified protein-calorie malnutrition: Secondary | ICD-10-CM | POA: Diagnosis present

## 2011-11-05 DIAGNOSIS — K449 Diaphragmatic hernia without obstruction or gangrene: Secondary | ICD-10-CM | POA: Diagnosis not present

## 2011-11-05 DIAGNOSIS — R0602 Shortness of breath: Secondary | ICD-10-CM | POA: Diagnosis not present

## 2011-11-05 DIAGNOSIS — M25569 Pain in unspecified knee: Secondary | ICD-10-CM | POA: Diagnosis not present

## 2011-11-05 DIAGNOSIS — M1710 Unilateral primary osteoarthritis, unspecified knee: Secondary | ICD-10-CM | POA: Diagnosis present

## 2011-11-05 DIAGNOSIS — F411 Generalized anxiety disorder: Secondary | ICD-10-CM | POA: Diagnosis present

## 2011-11-05 DIAGNOSIS — Z91018 Allergy to other foods: Secondary | ICD-10-CM | POA: Diagnosis not present

## 2011-11-05 DIAGNOSIS — Z8249 Family history of ischemic heart disease and other diseases of the circulatory system: Secondary | ICD-10-CM | POA: Diagnosis not present

## 2011-11-05 DIAGNOSIS — K259 Gastric ulcer, unspecified as acute or chronic, without hemorrhage or perforation: Secondary | ICD-10-CM | POA: Diagnosis not present

## 2011-11-05 DIAGNOSIS — R222 Localized swelling, mass and lump, trunk: Secondary | ICD-10-CM | POA: Diagnosis not present

## 2011-11-05 DIAGNOSIS — F329 Major depressive disorder, single episode, unspecified: Secondary | ICD-10-CM | POA: Diagnosis present

## 2011-11-05 DIAGNOSIS — R062 Wheezing: Secondary | ICD-10-CM | POA: Diagnosis not present

## 2011-11-05 DIAGNOSIS — Z90710 Acquired absence of both cervix and uterus: Secondary | ICD-10-CM | POA: Diagnosis not present

## 2011-11-05 DIAGNOSIS — R509 Fever, unspecified: Secondary | ICD-10-CM | POA: Diagnosis present

## 2011-11-05 DIAGNOSIS — Z96659 Presence of unspecified artificial knee joint: Secondary | ICD-10-CM | POA: Diagnosis not present

## 2011-11-05 DIAGNOSIS — Z888 Allergy status to other drugs, medicaments and biological substances status: Secondary | ICD-10-CM | POA: Diagnosis not present

## 2011-11-05 DIAGNOSIS — R5383 Other fatigue: Secondary | ICD-10-CM | POA: Diagnosis not present

## 2011-11-05 DIAGNOSIS — R5381 Other malaise: Secondary | ICD-10-CM | POA: Diagnosis present

## 2011-11-05 DIAGNOSIS — I10 Essential (primary) hypertension: Secondary | ICD-10-CM | POA: Diagnosis not present

## 2011-11-05 DIAGNOSIS — D649 Anemia, unspecified: Secondary | ICD-10-CM | POA: Diagnosis not present

## 2011-11-05 LAB — COMPREHENSIVE METABOLIC PANEL
Albumin: 2.2 g/dL — ABNORMAL LOW (ref 3.4–5.0)
Alkaline Phosphatase: 100 U/L (ref 50–136)
Anion Gap: 6 — ABNORMAL LOW (ref 7–16)
BUN: 9 mg/dL (ref 7–18)
Bilirubin,Total: 0.7 mg/dL (ref 0.2–1.0)
Calcium, Total: 7.9 mg/dL — ABNORMAL LOW (ref 8.5–10.1)
Chloride: 92 mmol/L — ABNORMAL LOW (ref 98–107)
Co2: 33 mmol/L — ABNORMAL HIGH (ref 21–32)
Creatinine: 0.65 mg/dL (ref 0.60–1.30)
EGFR (African American): >60
EGFR (Non-African Amer.): >60
Glucose: 128 mg/dL — ABNORMAL HIGH (ref 65–99)
Osmolality: 263 (ref 275–301)
Potassium: 3.2 mmol/L — ABNORMAL LOW (ref 3.5–5.1)
SGOT(AST): 24 U/L (ref 15–37)
SGPT (ALT): 16 U/L (ref 12–78)
Sodium: 131 mmol/L — ABNORMAL LOW (ref 136–145)
Total Protein: 6 g/dL — ABNORMAL LOW (ref 6.4–8.2)

## 2011-11-05 LAB — URINALYSIS, COMPLETE
Bacteria: NONE SEEN
Bilirubin,UR: NEGATIVE
Blood: NEGATIVE
Glucose,UR: NEGATIVE mg/dL (ref 0–75)
Ketone: NEGATIVE
Nitrite: NEGATIVE
Ph: 5 (ref 4.5–8.0)
Protein: NEGATIVE
RBC,UR: 1 /HPF (ref 0–5)
Specific Gravity: 1.018 (ref 1.003–1.030)
Squamous Epithelial: 3
WBC UR: 8 /HPF (ref 0–5)

## 2011-11-05 LAB — MAGNESIUM: Magnesium: 1.6 mg/dL — ABNORMAL LOW

## 2011-11-05 LAB — CBC
HCT: 21.8 % — ABNORMAL LOW (ref 35.0–47.0)
HGB: 7.2 g/dL — ABNORMAL LOW (ref 12.0–16.0)
MCH: 28.7 pg (ref 26.0–34.0)
MCHC: 33.2 g/dL (ref 32.0–36.0)
MCV: 86 fL (ref 80–100)
Platelet: 394 10*3/uL (ref 150–440)
RBC: 2.52 10*6/uL — ABNORMAL LOW (ref 3.80–5.20)
RDW: 14.5 % (ref 11.5–14.5)
WBC: 11 10*3/uL (ref 3.6–11.0)

## 2011-11-05 LAB — PROTIME-INR
INR: 1.3
Prothrombin Time: 16.1 secs — ABNORMAL HIGH (ref 11.5–14.7)

## 2011-11-05 LAB — LIPASE, BLOOD: Lipase: 38 U/L — ABNORMAL LOW (ref 73–393)

## 2011-11-05 LAB — TROPONIN I: Troponin-I: <0.02 ng/mL

## 2011-11-06 LAB — BASIC METABOLIC PANEL
Anion Gap: 8 (ref 7–16)
BUN: 6 mg/dL — ABNORMAL LOW (ref 7–18)
Calcium, Total: 7.7 mg/dL — ABNORMAL LOW (ref 8.5–10.1)
Chloride: 96 mmol/L — ABNORMAL LOW (ref 98–107)
Co2: 32 mmol/L (ref 21–32)
Creatinine: 0.63 mg/dL (ref 0.60–1.30)
EGFR (African American): >60
EGFR (Non-African Amer.): >60
Glucose: 111 mg/dL — ABNORMAL HIGH (ref 65–99)
Osmolality: 270 (ref 275–301)
Potassium: 3.1 mmol/L — ABNORMAL LOW (ref 3.5–5.1)
Sodium: 136 mmol/L (ref 136–145)

## 2011-11-06 LAB — CBC WITH DIFFERENTIAL/PLATELET
Basophil #: 0.1 10*3/uL (ref 0.0–0.1)
Basophil %: 0.7 %
Eosinophil #: 0.6 10*3/uL (ref 0.0–0.7)
Eosinophil %: 6.2 %
HCT: 21 % — ABNORMAL LOW (ref 35.0–47.0)
HGB: 7 g/dL — ABNORMAL LOW (ref 12.0–16.0)
Lymphocyte #: 1 10*3/uL (ref 1.0–3.6)
Lymphocyte %: 10.4 %
MCH: 28.7 pg (ref 26.0–34.0)
MCHC: 33.3 g/dL (ref 32.0–36.0)
MCV: 86 fL (ref 80–100)
Monocyte #: 0.6 x10 3/mm (ref 0.2–0.9)
Monocyte %: 6.4 %
Neutrophil #: 7 10*3/uL — ABNORMAL HIGH (ref 1.4–6.5)
Neutrophil %: 76.3 %
Platelet: 396 10*3/uL (ref 150–440)
RBC: 2.44 10*6/uL — ABNORMAL LOW (ref 3.80–5.20)
RDW: 14.6 % — ABNORMAL HIGH (ref 11.5–14.5)
WBC: 9.2 10*3/uL (ref 3.6–11.0)

## 2011-11-06 LAB — TROPONIN I: Troponin-I: <0.02 ng/mL

## 2011-11-06 LAB — IRON AND TIBC
Iron Bind.Cap.(Total): 263 ug/dL (ref 250–450)
Iron Saturation: 10 %
Iron: 27 ug/dL — ABNORMAL LOW (ref 50–170)
Unbound Iron-Bind.Cap.: 236 ug/dL

## 2011-11-06 LAB — CK-MB: CK-MB: 0.8 ng/mL (ref 0.5–3.6)

## 2011-11-06 LAB — FERRITIN: Ferritin (ARMC): 122 ng/mL (ref 8–388)

## 2011-11-07 LAB — BASIC METABOLIC PANEL
Anion Gap: 9 (ref 7–16)
BUN: 9 mg/dL (ref 7–18)
Calcium, Total: 7.8 mg/dL — ABNORMAL LOW (ref 8.5–10.1)
Chloride: 98 mmol/L (ref 98–107)
Co2: 30 mmol/L (ref 21–32)
Creatinine: 0.56 mg/dL — ABNORMAL LOW (ref 0.60–1.30)
EGFR (African American): >60
EGFR (Non-African Amer.): >60
Glucose: 99 mg/dL (ref 65–99)
Osmolality: 273 (ref 275–301)
Potassium: 4 mmol/L (ref 3.5–5.1)
Sodium: 137 mmol/L (ref 136–145)

## 2011-11-07 LAB — CBC WITH DIFFERENTIAL/PLATELET
Basophil #: 0.1 10*3/uL (ref 0.0–0.1)
Basophil %: 0.6 %
Eosinophil #: 0.8 10*3/uL — ABNORMAL HIGH (ref 0.0–0.7)
Eosinophil %: 7.9 %
HCT: 23.1 % — ABNORMAL LOW (ref 35.0–47.0)
HGB: 7.7 g/dL — ABNORMAL LOW (ref 12.0–16.0)
Lymphocyte #: 0.8 10*3/uL — ABNORMAL LOW (ref 1.0–3.6)
Lymphocyte %: 8.2 %
MCH: 28.7 pg (ref 26.0–34.0)
MCHC: 33.3 g/dL (ref 32.0–36.0)
MCV: 86 fL (ref 80–100)
Monocyte #: 0.5 x10 3/mm (ref 0.2–0.9)
Monocyte %: 4.7 %
Neutrophil #: 8.1 10*3/uL — ABNORMAL HIGH (ref 1.4–6.5)
Neutrophil %: 78.6 %
Platelet: 398 10*3/uL (ref 150–440)
RBC: 2.68 10*6/uL — ABNORMAL LOW (ref 3.80–5.20)
RDW: 14.8 % — ABNORMAL HIGH (ref 11.5–14.5)
WBC: 10.3 10*3/uL (ref 3.6–11.0)

## 2011-11-07 LAB — MAGNESIUM: Magnesium: 2.2 mg/dL

## 2011-11-08 LAB — FOLATE: Folic Acid: 13.8 ng/mL (ref 3.1–100.0)

## 2011-11-08 LAB — POTASSIUM
Potassium: 5.7 mmol/L — ABNORMAL HIGH (ref 3.5–5.1)
Potassium: 4.1 mmol/L (ref 3.5–5.1)

## 2011-11-08 LAB — FIBRIN DEGRADATION PROD.(ARMC ONLY): Fibrin Degradation Prod.: <10 (ref 2.1–7.7)

## 2011-11-08 LAB — RETICULOCYTES
Absolute Retic Count: 0.0798 10*6/uL (ref 0.023–0.096)
Reticulocyte: 2.9 % — ABNORMAL HIGH (ref 0.5–2.2)

## 2011-11-08 LAB — HEMOGLOBIN: HGB: 6.7 g/dL — ABNORMAL LOW (ref 12.0–16.0)

## 2011-11-08 LAB — LACTATE DEHYDROGENASE: LDH: 172 U/L (ref 81–234)

## 2011-11-08 LAB — OCCULT BLOOD X 1 CARD TO LAB, STOOL: Occult Blood, Feces: POSITIVE

## 2011-11-08 LAB — VANCOMYCIN, TROUGH: Vancomycin, Trough: 12 ug/mL (ref 10–20)

## 2011-11-08 LAB — HEMATOCRIT: HCT: 20.3 % — ABNORMAL LOW (ref 35.0–47.0)

## 2011-11-09 LAB — HEMATOCRIT: HCT: 26.7 % — ABNORMAL LOW (ref 35.0–47.0)

## 2011-11-09 LAB — DIFFERENTIAL
Bands: 2 %
Lymphocytes: 16 %
Monocytes: 7 %
Segmented Neutrophils: 75 %

## 2011-11-09 LAB — HEMOGLOBIN: HGB: 8.4 g/dL — ABNORMAL LOW (ref 12.0–16.0)

## 2011-11-09 LAB — POTASSIUM: Potassium: 3.9 mmol/L (ref 3.5–5.1)

## 2011-11-10 LAB — BASIC METABOLIC PANEL
Anion Gap: 8 (ref 7–16)
BUN: 8 mg/dL (ref 7–18)
Calcium, Total: 8.2 mg/dL — ABNORMAL LOW (ref 8.5–10.1)
Chloride: 99 mmol/L (ref 98–107)
Co2: 32 mmol/L (ref 21–32)
Creatinine: 0.78 mg/dL (ref 0.60–1.30)
EGFR (African American): >60
EGFR (Non-African Amer.): >60
Glucose: 140 mg/dL — ABNORMAL HIGH (ref 65–99)
Osmolality: 278 (ref 275–301)
Potassium: 3.7 mmol/L (ref 3.5–5.1)
Sodium: 139 mmol/L (ref 136–145)

## 2011-11-10 LAB — CBC WITH DIFFERENTIAL/PLATELET
Basophil #: 0.1 10*3/uL (ref 0.0–0.1)
Basophil %: 0.8 %
Eosinophil #: 0 10*3/uL (ref 0.0–0.7)
Eosinophil %: 0.5 %
HCT: 27.2 % — ABNORMAL LOW (ref 35.0–47.0)
HGB: 9 g/dL — ABNORMAL LOW (ref 12.0–16.0)
Lymphocyte #: 1.2 10*3/uL (ref 1.0–3.6)
Lymphocyte %: 12.1 %
MCH: 28.2 pg (ref 26.0–34.0)
MCHC: 33 g/dL (ref 32.0–36.0)
MCV: 85 fL (ref 80–100)
Monocyte #: 0.3 x10 3/mm (ref 0.2–0.9)
Monocyte %: 3.3 %
Neutrophil #: 8.4 10*3/uL — ABNORMAL HIGH (ref 1.4–6.5)
Neutrophil %: 83.3 %
Platelet: 363 10*3/uL (ref 150–440)
RBC: 3.18 10*6/uL — ABNORMAL LOW (ref 3.80–5.20)
RDW: 15 % — ABNORMAL HIGH (ref 11.5–14.5)
WBC: 10.1 10*3/uL (ref 3.6–11.0)

## 2011-11-10 LAB — CULTURE, BLOOD (SINGLE)

## 2011-11-10 LAB — VANCOMYCIN, TROUGH: Vancomycin, Trough: 19 ug/mL (ref 10–20)

## 2011-11-11 LAB — CBC WITH DIFFERENTIAL/PLATELET
Basophil #: 0.1 10*3/uL (ref 0.0–0.1)
Basophil %: 1 %
Eosinophil #: 0.6 10*3/uL (ref 0.0–0.7)
Eosinophil %: 5.8 %
HCT: 28.6 % — ABNORMAL LOW (ref 35.0–47.0)
HGB: 9.5 g/dL — ABNORMAL LOW (ref 12.0–16.0)
Lymphocyte #: 1.5 10*3/uL (ref 1.0–3.6)
Lymphocyte %: 14.8 %
MCH: 28.6 pg (ref 26.0–34.0)
MCHC: 33.2 g/dL (ref 32.0–36.0)
MCV: 86 fL (ref 80–100)
Monocyte #: 0.5 x10 3/mm (ref 0.2–0.9)
Monocyte %: 4.8 %
Neutrophil #: 7.5 10*3/uL — ABNORMAL HIGH (ref 1.4–6.5)
Neutrophil %: 73.6 %
Platelet: 374 10*3/uL (ref 150–440)
RBC: 3.32 10*6/uL — ABNORMAL LOW (ref 3.80–5.20)
RDW: 15.4 % — ABNORMAL HIGH (ref 11.5–14.5)
WBC: 10.1 10*3/uL (ref 3.6–11.0)

## 2011-11-12 LAB — CBC WITH DIFFERENTIAL/PLATELET
Basophil #: 0 10*3/uL (ref 0.0–0.1)
Basophil %: 0.4 %
Eosinophil #: 0.1 10*3/uL (ref 0.0–0.7)
Eosinophil %: 1.6 %
HCT: 28.6 % — ABNORMAL LOW (ref 35.0–47.0)
HGB: 9.7 g/dL — ABNORMAL LOW (ref 12.0–16.0)
Lymphocyte #: 1.2 10*3/uL (ref 1.0–3.6)
Lymphocyte %: 15.6 %
MCH: 28.9 pg (ref 26.0–34.0)
MCHC: 33.8 g/dL (ref 32.0–36.0)
MCV: 86 fL (ref 80–100)
Monocyte #: 0.4 x10 3/mm (ref 0.2–0.9)
Monocyte %: 5.4 %
Neutrophil #: 6.1 10*3/uL (ref 1.4–6.5)
Neutrophil %: 77 %
Platelet: 354 10*3/uL (ref 150–440)
RBC: 3.34 10*6/uL — ABNORMAL LOW (ref 3.80–5.20)
RDW: 15.1 % — ABNORMAL HIGH (ref 11.5–14.5)
WBC: 8 10*3/uL (ref 3.6–11.0)

## 2011-11-14 DIAGNOSIS — D62 Acute posthemorrhagic anemia: Secondary | ICD-10-CM | POA: Diagnosis not present

## 2011-11-14 DIAGNOSIS — Z471 Aftercare following joint replacement surgery: Secondary | ICD-10-CM | POA: Diagnosis not present

## 2011-11-14 DIAGNOSIS — IMO0001 Reserved for inherently not codable concepts without codable children: Secondary | ICD-10-CM | POA: Diagnosis not present

## 2011-11-14 DIAGNOSIS — J45909 Unspecified asthma, uncomplicated: Secondary | ICD-10-CM | POA: Diagnosis not present

## 2011-11-14 DIAGNOSIS — Z96659 Presence of unspecified artificial knee joint: Secondary | ICD-10-CM | POA: Diagnosis not present

## 2011-11-15 DIAGNOSIS — Z96659 Presence of unspecified artificial knee joint: Secondary | ICD-10-CM | POA: Diagnosis not present

## 2011-11-15 DIAGNOSIS — IMO0001 Reserved for inherently not codable concepts without codable children: Secondary | ICD-10-CM | POA: Diagnosis not present

## 2011-11-15 DIAGNOSIS — D62 Acute posthemorrhagic anemia: Secondary | ICD-10-CM | POA: Diagnosis not present

## 2011-11-15 DIAGNOSIS — Z471 Aftercare following joint replacement surgery: Secondary | ICD-10-CM | POA: Diagnosis not present

## 2011-11-15 DIAGNOSIS — J45909 Unspecified asthma, uncomplicated: Secondary | ICD-10-CM | POA: Diagnosis not present

## 2011-11-17 DIAGNOSIS — J45909 Unspecified asthma, uncomplicated: Secondary | ICD-10-CM | POA: Diagnosis not present

## 2011-11-17 DIAGNOSIS — IMO0001 Reserved for inherently not codable concepts without codable children: Secondary | ICD-10-CM | POA: Diagnosis not present

## 2011-11-17 DIAGNOSIS — Z96659 Presence of unspecified artificial knee joint: Secondary | ICD-10-CM | POA: Diagnosis not present

## 2011-11-17 DIAGNOSIS — D62 Acute posthemorrhagic anemia: Secondary | ICD-10-CM | POA: Diagnosis not present

## 2011-11-17 DIAGNOSIS — Z471 Aftercare following joint replacement surgery: Secondary | ICD-10-CM | POA: Diagnosis not present

## 2011-11-18 DIAGNOSIS — Z96659 Presence of unspecified artificial knee joint: Secondary | ICD-10-CM | POA: Diagnosis not present

## 2011-11-18 DIAGNOSIS — Z471 Aftercare following joint replacement surgery: Secondary | ICD-10-CM | POA: Diagnosis not present

## 2011-11-18 DIAGNOSIS — J45909 Unspecified asthma, uncomplicated: Secondary | ICD-10-CM | POA: Diagnosis not present

## 2011-11-18 DIAGNOSIS — IMO0001 Reserved for inherently not codable concepts without codable children: Secondary | ICD-10-CM | POA: Diagnosis not present

## 2011-11-18 DIAGNOSIS — D62 Acute posthemorrhagic anemia: Secondary | ICD-10-CM | POA: Diagnosis not present

## 2011-11-20 DIAGNOSIS — D62 Acute posthemorrhagic anemia: Secondary | ICD-10-CM | POA: Diagnosis not present

## 2011-11-20 DIAGNOSIS — IMO0001 Reserved for inherently not codable concepts without codable children: Secondary | ICD-10-CM | POA: Diagnosis not present

## 2011-11-20 DIAGNOSIS — J45909 Unspecified asthma, uncomplicated: Secondary | ICD-10-CM | POA: Diagnosis not present

## 2011-11-20 DIAGNOSIS — Z96659 Presence of unspecified artificial knee joint: Secondary | ICD-10-CM | POA: Diagnosis not present

## 2011-11-20 DIAGNOSIS — Z471 Aftercare following joint replacement surgery: Secondary | ICD-10-CM | POA: Diagnosis not present

## 2011-11-24 DIAGNOSIS — J45909 Unspecified asthma, uncomplicated: Secondary | ICD-10-CM | POA: Diagnosis not present

## 2011-11-24 DIAGNOSIS — Z471 Aftercare following joint replacement surgery: Secondary | ICD-10-CM | POA: Diagnosis not present

## 2011-11-24 DIAGNOSIS — Z96659 Presence of unspecified artificial knee joint: Secondary | ICD-10-CM | POA: Diagnosis not present

## 2011-11-24 DIAGNOSIS — IMO0001 Reserved for inherently not codable concepts without codable children: Secondary | ICD-10-CM | POA: Diagnosis not present

## 2011-11-24 DIAGNOSIS — D62 Acute posthemorrhagic anemia: Secondary | ICD-10-CM | POA: Diagnosis not present

## 2011-11-25 DIAGNOSIS — IMO0001 Reserved for inherently not codable concepts without codable children: Secondary | ICD-10-CM | POA: Diagnosis not present

## 2011-11-25 DIAGNOSIS — D62 Acute posthemorrhagic anemia: Secondary | ICD-10-CM | POA: Diagnosis not present

## 2011-11-25 DIAGNOSIS — Z96659 Presence of unspecified artificial knee joint: Secondary | ICD-10-CM | POA: Diagnosis not present

## 2011-11-25 DIAGNOSIS — Z471 Aftercare following joint replacement surgery: Secondary | ICD-10-CM | POA: Diagnosis not present

## 2011-11-25 DIAGNOSIS — J45909 Unspecified asthma, uncomplicated: Secondary | ICD-10-CM | POA: Diagnosis not present

## 2011-11-27 DIAGNOSIS — IMO0002 Reserved for concepts with insufficient information to code with codable children: Secondary | ICD-10-CM | POA: Diagnosis not present

## 2011-11-27 DIAGNOSIS — D62 Acute posthemorrhagic anemia: Secondary | ICD-10-CM | POA: Diagnosis not present

## 2011-11-27 DIAGNOSIS — J45909 Unspecified asthma, uncomplicated: Secondary | ICD-10-CM | POA: Diagnosis not present

## 2011-11-27 DIAGNOSIS — IMO0001 Reserved for inherently not codable concepts without codable children: Secondary | ICD-10-CM | POA: Diagnosis not present

## 2011-11-27 DIAGNOSIS — M1710 Unilateral primary osteoarthritis, unspecified knee: Secondary | ICD-10-CM | POA: Diagnosis not present

## 2011-11-27 DIAGNOSIS — Z471 Aftercare following joint replacement surgery: Secondary | ICD-10-CM | POA: Diagnosis not present

## 2011-11-27 DIAGNOSIS — Z96659 Presence of unspecified artificial knee joint: Secondary | ICD-10-CM | POA: Diagnosis not present

## 2011-11-30 DIAGNOSIS — IMO0001 Reserved for inherently not codable concepts without codable children: Secondary | ICD-10-CM | POA: Diagnosis not present

## 2011-11-30 DIAGNOSIS — Z471 Aftercare following joint replacement surgery: Secondary | ICD-10-CM | POA: Diagnosis not present

## 2011-11-30 DIAGNOSIS — D62 Acute posthemorrhagic anemia: Secondary | ICD-10-CM | POA: Diagnosis not present

## 2011-11-30 DIAGNOSIS — J45909 Unspecified asthma, uncomplicated: Secondary | ICD-10-CM | POA: Diagnosis not present

## 2011-11-30 DIAGNOSIS — Z96659 Presence of unspecified artificial knee joint: Secondary | ICD-10-CM | POA: Diagnosis not present

## 2011-12-02 DIAGNOSIS — Z96659 Presence of unspecified artificial knee joint: Secondary | ICD-10-CM | POA: Diagnosis not present

## 2011-12-02 DIAGNOSIS — Z471 Aftercare following joint replacement surgery: Secondary | ICD-10-CM | POA: Diagnosis not present

## 2011-12-02 DIAGNOSIS — J45909 Unspecified asthma, uncomplicated: Secondary | ICD-10-CM | POA: Diagnosis not present

## 2011-12-02 DIAGNOSIS — D62 Acute posthemorrhagic anemia: Secondary | ICD-10-CM | POA: Diagnosis not present

## 2011-12-02 DIAGNOSIS — IMO0001 Reserved for inherently not codable concepts without codable children: Secondary | ICD-10-CM | POA: Diagnosis not present

## 2011-12-03 DIAGNOSIS — I10 Essential (primary) hypertension: Secondary | ICD-10-CM | POA: Diagnosis not present

## 2011-12-03 DIAGNOSIS — D500 Iron deficiency anemia secondary to blood loss (chronic): Secondary | ICD-10-CM | POA: Diagnosis not present

## 2011-12-03 DIAGNOSIS — Z86010 Personal history of colonic polyps: Secondary | ICD-10-CM | POA: Diagnosis not present

## 2011-12-03 DIAGNOSIS — K259 Gastric ulcer, unspecified as acute or chronic, without hemorrhage or perforation: Secondary | ICD-10-CM | POA: Diagnosis not present

## 2011-12-04 DIAGNOSIS — J45909 Unspecified asthma, uncomplicated: Secondary | ICD-10-CM | POA: Diagnosis not present

## 2011-12-04 DIAGNOSIS — Z96659 Presence of unspecified artificial knee joint: Secondary | ICD-10-CM | POA: Diagnosis not present

## 2011-12-04 DIAGNOSIS — Z471 Aftercare following joint replacement surgery: Secondary | ICD-10-CM | POA: Diagnosis not present

## 2011-12-04 DIAGNOSIS — IMO0001 Reserved for inherently not codable concepts without codable children: Secondary | ICD-10-CM | POA: Diagnosis not present

## 2011-12-04 DIAGNOSIS — D62 Acute posthemorrhagic anemia: Secondary | ICD-10-CM | POA: Diagnosis not present

## 2011-12-14 DIAGNOSIS — M25669 Stiffness of unspecified knee, not elsewhere classified: Secondary | ICD-10-CM | POA: Diagnosis not present

## 2011-12-14 DIAGNOSIS — M25569 Pain in unspecified knee: Secondary | ICD-10-CM | POA: Diagnosis not present

## 2011-12-14 DIAGNOSIS — Z96659 Presence of unspecified artificial knee joint: Secondary | ICD-10-CM | POA: Diagnosis not present

## 2011-12-14 DIAGNOSIS — M6281 Muscle weakness (generalized): Secondary | ICD-10-CM | POA: Diagnosis not present

## 2011-12-16 DIAGNOSIS — M25569 Pain in unspecified knee: Secondary | ICD-10-CM | POA: Diagnosis not present

## 2011-12-16 DIAGNOSIS — M6281 Muscle weakness (generalized): Secondary | ICD-10-CM | POA: Diagnosis not present

## 2011-12-16 DIAGNOSIS — M25669 Stiffness of unspecified knee, not elsewhere classified: Secondary | ICD-10-CM | POA: Diagnosis not present

## 2011-12-16 DIAGNOSIS — Z96659 Presence of unspecified artificial knee joint: Secondary | ICD-10-CM | POA: Diagnosis not present

## 2011-12-18 DIAGNOSIS — M25669 Stiffness of unspecified knee, not elsewhere classified: Secondary | ICD-10-CM | POA: Diagnosis not present

## 2011-12-18 DIAGNOSIS — Z96659 Presence of unspecified artificial knee joint: Secondary | ICD-10-CM | POA: Diagnosis not present

## 2011-12-18 DIAGNOSIS — M25569 Pain in unspecified knee: Secondary | ICD-10-CM | POA: Diagnosis not present

## 2011-12-18 DIAGNOSIS — M6281 Muscle weakness (generalized): Secondary | ICD-10-CM | POA: Diagnosis not present

## 2011-12-21 DIAGNOSIS — M25569 Pain in unspecified knee: Secondary | ICD-10-CM | POA: Diagnosis not present

## 2011-12-21 DIAGNOSIS — M25669 Stiffness of unspecified knee, not elsewhere classified: Secondary | ICD-10-CM | POA: Diagnosis not present

## 2011-12-21 DIAGNOSIS — M6281 Muscle weakness (generalized): Secondary | ICD-10-CM | POA: Diagnosis not present

## 2011-12-21 DIAGNOSIS — Z96659 Presence of unspecified artificial knee joint: Secondary | ICD-10-CM | POA: Diagnosis not present

## 2011-12-23 ENCOUNTER — Ambulatory Visit: Payer: Self-pay | Admitting: Internal Medicine

## 2011-12-23 DIAGNOSIS — Z79899 Other long term (current) drug therapy: Secondary | ICD-10-CM | POA: Diagnosis not present

## 2011-12-23 DIAGNOSIS — M6281 Muscle weakness (generalized): Secondary | ICD-10-CM | POA: Diagnosis not present

## 2011-12-23 DIAGNOSIS — F329 Major depressive disorder, single episode, unspecified: Secondary | ICD-10-CM | POA: Diagnosis not present

## 2011-12-23 DIAGNOSIS — M25669 Stiffness of unspecified knee, not elsewhere classified: Secondary | ICD-10-CM | POA: Diagnosis not present

## 2011-12-23 DIAGNOSIS — Z96659 Presence of unspecified artificial knee joint: Secondary | ICD-10-CM | POA: Diagnosis not present

## 2011-12-23 DIAGNOSIS — F3289 Other specified depressive episodes: Secondary | ICD-10-CM | POA: Diagnosis not present

## 2011-12-23 DIAGNOSIS — F411 Generalized anxiety disorder: Secondary | ICD-10-CM | POA: Diagnosis not present

## 2011-12-23 DIAGNOSIS — M25569 Pain in unspecified knee: Secondary | ICD-10-CM | POA: Diagnosis not present

## 2011-12-23 DIAGNOSIS — D509 Iron deficiency anemia, unspecified: Secondary | ICD-10-CM | POA: Diagnosis not present

## 2011-12-23 DIAGNOSIS — K922 Gastrointestinal hemorrhage, unspecified: Secondary | ICD-10-CM | POA: Diagnosis not present

## 2011-12-23 LAB — CBC CANCER CENTER
Basophil #: 0.1 x10 3/mm (ref 0.0–0.1)
Basophil %: 1.1 %
Eosinophil #: 0.6 x10 3/mm (ref 0.0–0.7)
Eosinophil %: 8.6 %
HCT: 35 % (ref 35.0–47.0)
HGB: 11.2 g/dL — ABNORMAL LOW (ref 12.0–16.0)
Lymphocyte #: 1.7 x10 3/mm (ref 1.0–3.6)
Lymphocyte %: 23.4 %
MCH: 28.5 pg (ref 26.0–34.0)
MCHC: 32 g/dL (ref 32.0–36.0)
MCV: 89 fL (ref 80–100)
Monocyte #: 0.5 x10 3/mm (ref 0.2–0.9)
Monocyte %: 7.6 %
Neutrophil #: 4.2 x10 3/mm (ref 1.4–6.5)
Neutrophil %: 59.3 %
Platelet: 277 x10 3/mm (ref 150–440)
RBC: 3.94 10*6/uL (ref 3.80–5.20)
RDW: 16.5 % — ABNORMAL HIGH (ref 11.5–14.5)
WBC: 7.1 x10 3/mm (ref 3.6–11.0)

## 2012-01-05 DIAGNOSIS — M25669 Stiffness of unspecified knee, not elsewhere classified: Secondary | ICD-10-CM | POA: Diagnosis not present

## 2012-01-05 DIAGNOSIS — M25569 Pain in unspecified knee: Secondary | ICD-10-CM | POA: Diagnosis not present

## 2012-01-05 DIAGNOSIS — Z96659 Presence of unspecified artificial knee joint: Secondary | ICD-10-CM | POA: Diagnosis not present

## 2012-01-05 DIAGNOSIS — M6281 Muscle weakness (generalized): Secondary | ICD-10-CM | POA: Diagnosis not present

## 2012-01-07 DIAGNOSIS — Z96659 Presence of unspecified artificial knee joint: Secondary | ICD-10-CM | POA: Diagnosis not present

## 2012-01-07 DIAGNOSIS — M25669 Stiffness of unspecified knee, not elsewhere classified: Secondary | ICD-10-CM | POA: Diagnosis not present

## 2012-01-07 DIAGNOSIS — M25569 Pain in unspecified knee: Secondary | ICD-10-CM | POA: Diagnosis not present

## 2012-01-07 DIAGNOSIS — M6281 Muscle weakness (generalized): Secondary | ICD-10-CM | POA: Diagnosis not present

## 2012-01-15 ENCOUNTER — Ambulatory Visit: Payer: Self-pay | Admitting: Unknown Physician Specialty

## 2012-01-15 DIAGNOSIS — R195 Other fecal abnormalities: Secondary | ICD-10-CM | POA: Diagnosis not present

## 2012-01-15 DIAGNOSIS — K2270 Barrett's esophagus without dysplasia: Secondary | ICD-10-CM | POA: Diagnosis not present

## 2012-01-15 DIAGNOSIS — G43909 Migraine, unspecified, not intractable, without status migrainosus: Secondary | ICD-10-CM | POA: Diagnosis not present

## 2012-01-15 DIAGNOSIS — G8929 Other chronic pain: Secondary | ICD-10-CM | POA: Diagnosis not present

## 2012-01-15 DIAGNOSIS — Z881 Allergy status to other antibiotic agents status: Secondary | ICD-10-CM | POA: Diagnosis not present

## 2012-01-15 DIAGNOSIS — K259 Gastric ulcer, unspecified as acute or chronic, without hemorrhage or perforation: Secondary | ICD-10-CM | POA: Diagnosis not present

## 2012-01-15 DIAGNOSIS — R634 Abnormal weight loss: Secondary | ICD-10-CM | POA: Diagnosis not present

## 2012-01-15 DIAGNOSIS — F411 Generalized anxiety disorder: Secondary | ICD-10-CM | POA: Diagnosis not present

## 2012-01-15 DIAGNOSIS — F3289 Other specified depressive episodes: Secondary | ICD-10-CM | POA: Diagnosis not present

## 2012-01-15 DIAGNOSIS — Z79899 Other long term (current) drug therapy: Secondary | ICD-10-CM | POA: Diagnosis not present

## 2012-01-15 DIAGNOSIS — I10 Essential (primary) hypertension: Secondary | ICD-10-CM | POA: Diagnosis not present

## 2012-01-15 DIAGNOSIS — K5730 Diverticulosis of large intestine without perforation or abscess without bleeding: Secondary | ICD-10-CM | POA: Diagnosis not present

## 2012-01-15 DIAGNOSIS — Z09 Encounter for follow-up examination after completed treatment for conditions other than malignant neoplasm: Secondary | ICD-10-CM | POA: Diagnosis not present

## 2012-01-15 DIAGNOSIS — Z886 Allergy status to analgesic agent status: Secondary | ICD-10-CM | POA: Diagnosis not present

## 2012-01-15 DIAGNOSIS — K648 Other hemorrhoids: Secondary | ICD-10-CM | POA: Diagnosis not present

## 2012-01-15 DIAGNOSIS — Z8711 Personal history of peptic ulcer disease: Secondary | ICD-10-CM | POA: Diagnosis not present

## 2012-01-15 DIAGNOSIS — Z6825 Body mass index (BMI) 25.0-25.9, adult: Secondary | ICD-10-CM | POA: Diagnosis not present

## 2012-01-15 DIAGNOSIS — E119 Type 2 diabetes mellitus without complications: Secondary | ICD-10-CM | POA: Diagnosis not present

## 2012-01-15 DIAGNOSIS — D500 Iron deficiency anemia secondary to blood loss (chronic): Secondary | ICD-10-CM | POA: Diagnosis not present

## 2012-01-15 DIAGNOSIS — Z86010 Personal history of colonic polyps: Secondary | ICD-10-CM | POA: Diagnosis not present

## 2012-01-15 DIAGNOSIS — Z888 Allergy status to other drugs, medicaments and biological substances status: Secondary | ICD-10-CM | POA: Diagnosis not present

## 2012-01-15 DIAGNOSIS — Z96659 Presence of unspecified artificial knee joint: Secondary | ICD-10-CM | POA: Diagnosis not present

## 2012-01-15 DIAGNOSIS — D509 Iron deficiency anemia, unspecified: Secondary | ICD-10-CM | POA: Diagnosis not present

## 2012-01-15 DIAGNOSIS — F329 Major depressive disorder, single episode, unspecified: Secondary | ICD-10-CM | POA: Diagnosis not present

## 2012-01-15 DIAGNOSIS — J45909 Unspecified asthma, uncomplicated: Secondary | ICD-10-CM | POA: Diagnosis not present

## 2012-01-15 DIAGNOSIS — K649 Unspecified hemorrhoids: Secondary | ICD-10-CM | POA: Diagnosis not present

## 2012-01-20 DIAGNOSIS — R5381 Other malaise: Secondary | ICD-10-CM | POA: Diagnosis not present

## 2012-01-20 DIAGNOSIS — Z23 Encounter for immunization: Secondary | ICD-10-CM | POA: Diagnosis not present

## 2012-01-20 DIAGNOSIS — R5383 Other fatigue: Secondary | ICD-10-CM | POA: Diagnosis not present

## 2012-01-20 DIAGNOSIS — R079 Chest pain, unspecified: Secondary | ICD-10-CM | POA: Diagnosis not present

## 2012-01-20 DIAGNOSIS — R0602 Shortness of breath: Secondary | ICD-10-CM | POA: Diagnosis not present

## 2012-01-21 DIAGNOSIS — M6281 Muscle weakness (generalized): Secondary | ICD-10-CM | POA: Diagnosis not present

## 2012-01-21 DIAGNOSIS — M25569 Pain in unspecified knee: Secondary | ICD-10-CM | POA: Diagnosis not present

## 2012-01-21 DIAGNOSIS — Z96659 Presence of unspecified artificial knee joint: Secondary | ICD-10-CM | POA: Diagnosis not present

## 2012-01-21 DIAGNOSIS — M25669 Stiffness of unspecified knee, not elsewhere classified: Secondary | ICD-10-CM | POA: Diagnosis not present

## 2012-01-22 ENCOUNTER — Ambulatory Visit: Payer: Self-pay | Admitting: Internal Medicine

## 2012-02-08 DIAGNOSIS — R062 Wheezing: Secondary | ICD-10-CM | POA: Diagnosis not present

## 2012-02-08 DIAGNOSIS — J441 Chronic obstructive pulmonary disease with (acute) exacerbation: Secondary | ICD-10-CM | POA: Diagnosis not present

## 2012-02-08 DIAGNOSIS — R0602 Shortness of breath: Secondary | ICD-10-CM | POA: Diagnosis not present

## 2012-02-08 DIAGNOSIS — F411 Generalized anxiety disorder: Secondary | ICD-10-CM | POA: Diagnosis not present

## 2012-02-08 DIAGNOSIS — R5383 Other fatigue: Secondary | ICD-10-CM | POA: Diagnosis not present

## 2012-02-08 DIAGNOSIS — R5381 Other malaise: Secondary | ICD-10-CM | POA: Diagnosis not present

## 2012-02-10 ENCOUNTER — Ambulatory Visit: Payer: Self-pay | Admitting: Physician Assistant

## 2012-02-10 DIAGNOSIS — R0602 Shortness of breath: Secondary | ICD-10-CM | POA: Diagnosis not present

## 2012-03-23 ENCOUNTER — Ambulatory Visit: Payer: Self-pay | Admitting: Internal Medicine

## 2012-03-31 ENCOUNTER — Emergency Department: Payer: Self-pay | Admitting: Emergency Medicine

## 2012-03-31 DIAGNOSIS — I10 Essential (primary) hypertension: Secondary | ICD-10-CM | POA: Diagnosis not present

## 2012-03-31 DIAGNOSIS — Z9889 Other specified postprocedural states: Secondary | ICD-10-CM | POA: Diagnosis not present

## 2012-03-31 DIAGNOSIS — R109 Unspecified abdominal pain: Secondary | ICD-10-CM | POA: Diagnosis not present

## 2012-03-31 DIAGNOSIS — G2581 Restless legs syndrome: Secondary | ICD-10-CM | POA: Diagnosis not present

## 2012-03-31 DIAGNOSIS — J45909 Unspecified asthma, uncomplicated: Secondary | ICD-10-CM | POA: Diagnosis not present

## 2012-03-31 DIAGNOSIS — Z9089 Acquired absence of other organs: Secondary | ICD-10-CM | POA: Diagnosis not present

## 2012-03-31 DIAGNOSIS — N200 Calculus of kidney: Secondary | ICD-10-CM | POA: Diagnosis not present

## 2012-03-31 DIAGNOSIS — Z79899 Other long term (current) drug therapy: Secondary | ICD-10-CM | POA: Diagnosis not present

## 2012-03-31 DIAGNOSIS — N23 Unspecified renal colic: Secondary | ICD-10-CM | POA: Diagnosis not present

## 2012-03-31 LAB — BASIC METABOLIC PANEL
Anion Gap: 7 (ref 7–16)
BUN: 17 mg/dL (ref 7–18)
Calcium, Total: 9.1 mg/dL (ref 8.5–10.1)
Chloride: 102 mmol/L (ref 98–107)
Co2: 28 mmol/L (ref 21–32)
Creatinine: 0.59 mg/dL — ABNORMAL LOW (ref 0.60–1.30)
EGFR (African American): >60
EGFR (Non-African Amer.): >60
Glucose: 85 mg/dL (ref 65–99)
Osmolality: 275 (ref 275–301)
Potassium: 4.5 mmol/L (ref 3.5–5.1)
Sodium: 137 mmol/L (ref 136–145)

## 2012-03-31 LAB — URINALYSIS, COMPLETE
Bacteria: NONE SEEN
Bilirubin,UR: NEGATIVE
Glucose,UR: NEGATIVE mg/dL (ref 0–75)
Ketone: NEGATIVE
Leukocyte Esterase: NEGATIVE
Nitrite: NEGATIVE
Ph: 5 (ref 4.5–8.0)
Protein: NEGATIVE
RBC,UR: 3 /HPF (ref 0–5)
Specific Gravity: 1.027 (ref 1.003–1.030)
Squamous Epithelial: 8
WBC UR: 1 /HPF (ref 0–5)

## 2012-03-31 LAB — CBC
HCT: 42.6 % (ref 35.0–47.0)
HGB: 14.3 g/dL (ref 12.0–16.0)
MCH: 30.6 pg (ref 26.0–34.0)
MCHC: 33.5 g/dL (ref 32.0–36.0)
MCV: 91 fL (ref 80–100)
Platelet: 256 10*3/uL (ref 150–440)
RBC: 4.67 10*6/uL (ref 3.80–5.20)
RDW: 14.3 % (ref 11.5–14.5)
WBC: 8.4 10*3/uL (ref 3.6–11.0)

## 2012-04-18 DIAGNOSIS — J449 Chronic obstructive pulmonary disease, unspecified: Secondary | ICD-10-CM | POA: Diagnosis not present

## 2012-04-18 DIAGNOSIS — J441 Chronic obstructive pulmonary disease with (acute) exacerbation: Secondary | ICD-10-CM | POA: Diagnosis not present

## 2012-04-18 DIAGNOSIS — R5381 Other malaise: Secondary | ICD-10-CM | POA: Diagnosis not present

## 2012-04-18 DIAGNOSIS — J209 Acute bronchitis, unspecified: Secondary | ICD-10-CM | POA: Diagnosis not present

## 2012-04-18 DIAGNOSIS — R0602 Shortness of breath: Secondary | ICD-10-CM | POA: Diagnosis not present

## 2012-04-23 ENCOUNTER — Ambulatory Visit: Payer: Self-pay | Admitting: Internal Medicine

## 2012-04-23 DIAGNOSIS — D500 Iron deficiency anemia secondary to blood loss (chronic): Secondary | ICD-10-CM | POA: Diagnosis not present

## 2012-04-23 DIAGNOSIS — K279 Peptic ulcer, site unspecified, unspecified as acute or chronic, without hemorrhage or perforation: Secondary | ICD-10-CM | POA: Diagnosis not present

## 2012-04-23 DIAGNOSIS — F329 Major depressive disorder, single episode, unspecified: Secondary | ICD-10-CM | POA: Diagnosis not present

## 2012-04-23 DIAGNOSIS — Z9089 Acquired absence of other organs: Secondary | ICD-10-CM | POA: Diagnosis not present

## 2012-04-23 DIAGNOSIS — G43909 Migraine, unspecified, not intractable, without status migrainosus: Secondary | ICD-10-CM | POA: Diagnosis not present

## 2012-04-23 DIAGNOSIS — F411 Generalized anxiety disorder: Secondary | ICD-10-CM | POA: Diagnosis not present

## 2012-04-23 DIAGNOSIS — J45909 Unspecified asthma, uncomplicated: Secondary | ICD-10-CM | POA: Diagnosis not present

## 2012-04-23 DIAGNOSIS — F3289 Other specified depressive episodes: Secondary | ICD-10-CM | POA: Diagnosis not present

## 2012-04-23 DIAGNOSIS — K922 Gastrointestinal hemorrhage, unspecified: Secondary | ICD-10-CM | POA: Diagnosis not present

## 2012-04-26 DIAGNOSIS — N23 Unspecified renal colic: Secondary | ICD-10-CM | POA: Insufficient documentation

## 2012-04-26 DIAGNOSIS — N200 Calculus of kidney: Secondary | ICD-10-CM | POA: Diagnosis not present

## 2012-04-26 DIAGNOSIS — N3941 Urge incontinence: Secondary | ICD-10-CM | POA: Diagnosis not present

## 2012-04-27 DIAGNOSIS — I10 Essential (primary) hypertension: Secondary | ICD-10-CM | POA: Diagnosis not present

## 2012-04-27 DIAGNOSIS — R5383 Other fatigue: Secondary | ICD-10-CM | POA: Diagnosis not present

## 2012-04-27 DIAGNOSIS — R062 Wheezing: Secondary | ICD-10-CM | POA: Diagnosis not present

## 2012-04-27 DIAGNOSIS — J209 Acute bronchitis, unspecified: Secondary | ICD-10-CM | POA: Diagnosis not present

## 2012-04-27 DIAGNOSIS — J45909 Unspecified asthma, uncomplicated: Secondary | ICD-10-CM | POA: Diagnosis not present

## 2012-04-27 DIAGNOSIS — R0602 Shortness of breath: Secondary | ICD-10-CM | POA: Diagnosis not present

## 2012-04-27 DIAGNOSIS — D518 Other vitamin B12 deficiency anemias: Secondary | ICD-10-CM | POA: Diagnosis not present

## 2012-04-27 DIAGNOSIS — R5381 Other malaise: Secondary | ICD-10-CM | POA: Diagnosis not present

## 2012-04-28 DIAGNOSIS — N200 Calculus of kidney: Secondary | ICD-10-CM | POA: Diagnosis not present

## 2012-05-09 DIAGNOSIS — K922 Gastrointestinal hemorrhage, unspecified: Secondary | ICD-10-CM | POA: Diagnosis not present

## 2012-05-09 DIAGNOSIS — D500 Iron deficiency anemia secondary to blood loss (chronic): Secondary | ICD-10-CM | POA: Diagnosis not present

## 2012-05-09 LAB — CANCER CENTER HEMOGLOBIN: HGB: 13.1 g/dL (ref 12.0–16.0)

## 2012-05-09 LAB — IRON AND TIBC
Iron Bind.Cap.(Total): 393 ug/dL (ref 250–450)
Iron Saturation: 25 %
Iron: 100 ug/dL (ref 50–170)
Unbound Iron-Bind.Cap.: 293 ug/dL

## 2012-05-09 LAB — FERRITIN: Ferritin (ARMC): 67 ng/mL (ref 8–388)

## 2012-05-21 ENCOUNTER — Ambulatory Visit: Payer: Self-pay | Admitting: Internal Medicine

## 2012-06-09 DIAGNOSIS — N23 Unspecified renal colic: Secondary | ICD-10-CM | POA: Diagnosis not present

## 2012-06-09 DIAGNOSIS — R300 Dysuria: Secondary | ICD-10-CM | POA: Diagnosis not present

## 2012-06-20 DIAGNOSIS — F411 Generalized anxiety disorder: Secondary | ICD-10-CM | POA: Diagnosis not present

## 2012-06-20 DIAGNOSIS — J45901 Unspecified asthma with (acute) exacerbation: Secondary | ICD-10-CM | POA: Diagnosis not present

## 2012-06-20 DIAGNOSIS — J441 Chronic obstructive pulmonary disease with (acute) exacerbation: Secondary | ICD-10-CM | POA: Diagnosis not present

## 2012-06-20 DIAGNOSIS — G43909 Migraine, unspecified, not intractable, without status migrainosus: Secondary | ICD-10-CM | POA: Diagnosis not present

## 2012-06-20 DIAGNOSIS — J449 Chronic obstructive pulmonary disease, unspecified: Secondary | ICD-10-CM | POA: Diagnosis not present

## 2012-06-21 DIAGNOSIS — H18609 Keratoconus, unspecified, unspecified eye: Secondary | ICD-10-CM | POA: Diagnosis not present

## 2012-06-29 ENCOUNTER — Emergency Department: Payer: Self-pay | Admitting: Emergency Medicine

## 2012-06-29 DIAGNOSIS — R0602 Shortness of breath: Secondary | ICD-10-CM | POA: Diagnosis not present

## 2012-06-29 DIAGNOSIS — J209 Acute bronchitis, unspecified: Secondary | ICD-10-CM | POA: Diagnosis not present

## 2012-06-29 DIAGNOSIS — J45901 Unspecified asthma with (acute) exacerbation: Secondary | ICD-10-CM | POA: Diagnosis not present

## 2012-06-29 LAB — CBC
HCT: 36.2 % (ref 35.0–47.0)
HGB: 12.1 g/dL (ref 12.0–16.0)
MCH: 31.5 pg (ref 26.0–34.0)
MCHC: 33.5 g/dL (ref 32.0–36.0)
MCV: 94 fL (ref 80–100)
Platelet: 228 10*3/uL (ref 150–440)
RBC: 3.86 10*6/uL (ref 3.80–5.20)
RDW: 13.5 % (ref 11.5–14.5)
WBC: 14.6 10*3/uL — ABNORMAL HIGH (ref 3.6–11.0)

## 2012-06-29 LAB — COMPREHENSIVE METABOLIC PANEL
Albumin: 3.4 g/dL (ref 3.4–5.0)
Alkaline Phosphatase: 87 U/L (ref 50–136)
Anion Gap: 5 — ABNORMAL LOW (ref 7–16)
BUN: 14 mg/dL (ref 7–18)
Bilirubin,Total: 0.2 mg/dL (ref 0.2–1.0)
Calcium, Total: 7.9 mg/dL — ABNORMAL LOW (ref 8.5–10.1)
Chloride: 104 mmol/L (ref 98–107)
Co2: 30 mmol/L (ref 21–32)
Creatinine: 0.58 mg/dL — ABNORMAL LOW (ref 0.60–1.30)
EGFR (African American): >60
EGFR (Non-African Amer.): >60
Glucose: 92 mg/dL (ref 65–99)
Osmolality: 278 (ref 275–301)
Potassium: 4.1 mmol/L (ref 3.5–5.1)
SGOT(AST): 17 U/L (ref 15–37)
SGPT (ALT): 24 U/L (ref 12–78)
Sodium: 139 mmol/L (ref 136–145)
Total Protein: 6.2 g/dL — ABNORMAL LOW (ref 6.4–8.2)

## 2012-06-29 LAB — CK TOTAL AND CKMB (NOT AT ARMC)
CK, Total: 59 U/L (ref 21–215)
CK-MB: 1.5 ng/mL (ref 0.5–3.6)

## 2012-06-29 LAB — TROPONIN I: Troponin-I: <0.02 ng/mL

## 2012-06-29 LAB — PRO B NATRIURETIC PEPTIDE: B-Type Natriuretic Peptide: 610 pg/mL — ABNORMAL HIGH (ref 0–125)

## 2012-07-12 DIAGNOSIS — Z947 Corneal transplant status: Secondary | ICD-10-CM | POA: Diagnosis not present

## 2012-07-12 DIAGNOSIS — H538 Other visual disturbances: Secondary | ICD-10-CM | POA: Diagnosis not present

## 2012-07-12 DIAGNOSIS — H18239 Secondary corneal edema, unspecified eye: Secondary | ICD-10-CM | POA: Diagnosis not present

## 2012-07-12 DIAGNOSIS — H18609 Keratoconus, unspecified, unspecified eye: Secondary | ICD-10-CM | POA: Diagnosis not present

## 2012-07-25 DIAGNOSIS — S8010XA Contusion of unspecified lower leg, initial encounter: Secondary | ICD-10-CM | POA: Diagnosis not present

## 2012-07-25 DIAGNOSIS — M79609 Pain in unspecified limb: Secondary | ICD-10-CM | POA: Diagnosis not present

## 2012-08-17 DIAGNOSIS — T85398A Other mechanical complication of other ocular prosthetic devices, implants and grafts, initial encounter: Secondary | ICD-10-CM | POA: Insufficient documentation

## 2012-08-22 ENCOUNTER — Emergency Department: Payer: Self-pay | Admitting: Emergency Medicine

## 2012-08-22 DIAGNOSIS — Z79899 Other long term (current) drug therapy: Secondary | ICD-10-CM | POA: Diagnosis not present

## 2012-08-22 DIAGNOSIS — G43909 Migraine, unspecified, not intractable, without status migrainosus: Secondary | ICD-10-CM | POA: Diagnosis not present

## 2012-08-22 DIAGNOSIS — Z9889 Other specified postprocedural states: Secondary | ICD-10-CM | POA: Diagnosis not present

## 2012-08-26 DIAGNOSIS — J45909 Unspecified asthma, uncomplicated: Secondary | ICD-10-CM | POA: Diagnosis not present

## 2012-08-26 DIAGNOSIS — M549 Dorsalgia, unspecified: Secondary | ICD-10-CM | POA: Diagnosis not present

## 2012-08-26 DIAGNOSIS — F411 Generalized anxiety disorder: Secondary | ICD-10-CM | POA: Diagnosis not present

## 2012-08-26 DIAGNOSIS — R0602 Shortness of breath: Secondary | ICD-10-CM | POA: Diagnosis not present

## 2012-08-26 DIAGNOSIS — I10 Essential (primary) hypertension: Secondary | ICD-10-CM | POA: Diagnosis not present

## 2012-08-26 DIAGNOSIS — R5383 Other fatigue: Secondary | ICD-10-CM | POA: Diagnosis not present

## 2012-08-26 DIAGNOSIS — R5381 Other malaise: Secondary | ICD-10-CM | POA: Diagnosis not present

## 2012-08-26 DIAGNOSIS — R061 Stridor: Secondary | ICD-10-CM | POA: Diagnosis not present

## 2012-08-26 DIAGNOSIS — M159 Polyosteoarthritis, unspecified: Secondary | ICD-10-CM | POA: Diagnosis not present

## 2012-09-14 ENCOUNTER — Ambulatory Visit: Payer: Self-pay | Admitting: Urology

## 2012-09-14 DIAGNOSIS — N23 Unspecified renal colic: Secondary | ICD-10-CM | POA: Diagnosis not present

## 2012-09-14 DIAGNOSIS — N200 Calculus of kidney: Secondary | ICD-10-CM | POA: Diagnosis not present

## 2012-09-14 DIAGNOSIS — K5900 Constipation, unspecified: Secondary | ICD-10-CM | POA: Diagnosis not present

## 2012-09-14 DIAGNOSIS — N3941 Urge incontinence: Secondary | ICD-10-CM | POA: Diagnosis not present

## 2012-09-14 DIAGNOSIS — IMO0002 Reserved for concepts with insufficient information to code with codable children: Secondary | ICD-10-CM | POA: Diagnosis not present

## 2012-10-04 DIAGNOSIS — I10 Essential (primary) hypertension: Secondary | ICD-10-CM | POA: Diagnosis not present

## 2012-10-04 DIAGNOSIS — R0602 Shortness of breath: Secondary | ICD-10-CM | POA: Diagnosis not present

## 2012-10-04 DIAGNOSIS — F411 Generalized anxiety disorder: Secondary | ICD-10-CM | POA: Diagnosis not present

## 2012-10-04 DIAGNOSIS — G43919 Migraine, unspecified, intractable, without status migrainosus: Secondary | ICD-10-CM | POA: Diagnosis not present

## 2012-10-04 DIAGNOSIS — J309 Allergic rhinitis, unspecified: Secondary | ICD-10-CM | POA: Diagnosis not present

## 2012-10-04 DIAGNOSIS — J45901 Unspecified asthma with (acute) exacerbation: Secondary | ICD-10-CM | POA: Diagnosis not present

## 2012-10-04 DIAGNOSIS — J441 Chronic obstructive pulmonary disease with (acute) exacerbation: Secondary | ICD-10-CM | POA: Diagnosis not present

## 2012-10-04 DIAGNOSIS — R061 Stridor: Secondary | ICD-10-CM | POA: Diagnosis not present

## 2012-10-05 ENCOUNTER — Inpatient Hospital Stay: Payer: Self-pay | Admitting: Internal Medicine

## 2012-10-05 DIAGNOSIS — J45909 Unspecified asthma, uncomplicated: Secondary | ICD-10-CM | POA: Diagnosis not present

## 2012-10-05 DIAGNOSIS — Z833 Family history of diabetes mellitus: Secondary | ICD-10-CM | POA: Diagnosis not present

## 2012-10-05 DIAGNOSIS — K219 Gastro-esophageal reflux disease without esophagitis: Secondary | ICD-10-CM | POA: Diagnosis present

## 2012-10-05 DIAGNOSIS — Z8249 Family history of ischemic heart disease and other diseases of the circulatory system: Secondary | ICD-10-CM | POA: Diagnosis not present

## 2012-10-05 DIAGNOSIS — Z881 Allergy status to other antibiotic agents status: Secondary | ICD-10-CM | POA: Diagnosis not present

## 2012-10-05 DIAGNOSIS — I447 Left bundle-branch block, unspecified: Secondary | ICD-10-CM | POA: Diagnosis not present

## 2012-10-05 DIAGNOSIS — Z96659 Presence of unspecified artificial knee joint: Secondary | ICD-10-CM | POA: Diagnosis not present

## 2012-10-05 DIAGNOSIS — G43909 Migraine, unspecified, not intractable, without status migrainosus: Secondary | ICD-10-CM | POA: Diagnosis present

## 2012-10-05 DIAGNOSIS — J45901 Unspecified asthma with (acute) exacerbation: Secondary | ICD-10-CM | POA: Diagnosis not present

## 2012-10-05 DIAGNOSIS — Z888 Allergy status to other drugs, medicaments and biological substances status: Secondary | ICD-10-CM | POA: Diagnosis not present

## 2012-10-05 DIAGNOSIS — Z91018 Allergy to other foods: Secondary | ICD-10-CM | POA: Diagnosis not present

## 2012-10-05 DIAGNOSIS — Z886 Allergy status to analgesic agent status: Secondary | ICD-10-CM | POA: Diagnosis not present

## 2012-10-05 DIAGNOSIS — Z87442 Personal history of urinary calculi: Secondary | ICD-10-CM | POA: Diagnosis not present

## 2012-10-05 DIAGNOSIS — E669 Obesity, unspecified: Secondary | ICD-10-CM | POA: Diagnosis present

## 2012-10-05 DIAGNOSIS — R0602 Shortness of breath: Secondary | ICD-10-CM | POA: Diagnosis not present

## 2012-10-05 DIAGNOSIS — F3289 Other specified depressive episodes: Secondary | ICD-10-CM | POA: Diagnosis present

## 2012-10-05 DIAGNOSIS — Z90710 Acquired absence of both cervix and uterus: Secondary | ICD-10-CM | POA: Diagnosis not present

## 2012-10-05 DIAGNOSIS — F411 Generalized anxiety disorder: Secondary | ICD-10-CM | POA: Diagnosis not present

## 2012-10-05 DIAGNOSIS — K449 Diaphragmatic hernia without obstruction or gangrene: Secondary | ICD-10-CM | POA: Diagnosis present

## 2012-10-05 DIAGNOSIS — Z8711 Personal history of peptic ulcer disease: Secondary | ICD-10-CM | POA: Diagnosis not present

## 2012-10-05 DIAGNOSIS — K2270 Barrett's esophagus without dysplasia: Secondary | ICD-10-CM | POA: Diagnosis present

## 2012-10-05 DIAGNOSIS — Z9089 Acquired absence of other organs: Secondary | ICD-10-CM | POA: Diagnosis not present

## 2012-10-05 DIAGNOSIS — F329 Major depressive disorder, single episode, unspecified: Secondary | ICD-10-CM | POA: Diagnosis present

## 2012-10-05 DIAGNOSIS — Z79899 Other long term (current) drug therapy: Secondary | ICD-10-CM | POA: Diagnosis not present

## 2012-10-05 DIAGNOSIS — Z6830 Body mass index (BMI) 30.0-30.9, adult: Secondary | ICD-10-CM | POA: Diagnosis not present

## 2012-10-05 LAB — COMPREHENSIVE METABOLIC PANEL
Albumin: 3.7 g/dL (ref 3.4–5.0)
Alkaline Phosphatase: 86 U/L (ref 50–136)
Anion Gap: 9 (ref 7–16)
BUN: 11 mg/dL (ref 7–18)
Bilirubin,Total: 0.2 mg/dL (ref 0.2–1.0)
Calcium, Total: 9 mg/dL (ref 8.5–10.1)
Chloride: 104 mmol/L (ref 98–107)
Co2: 27 mmol/L (ref 21–32)
Creatinine: 0.7 mg/dL (ref 0.60–1.30)
EGFR (African American): >60
EGFR (Non-African Amer.): >60
Glucose: 97 mg/dL (ref 65–99)
Osmolality: 279 (ref 275–301)
Potassium: 3.8 mmol/L (ref 3.5–5.1)
SGOT(AST): 18 U/L (ref 15–37)
SGPT (ALT): 29 U/L (ref 12–78)
Sodium: 140 mmol/L (ref 136–145)
Total Protein: 7.1 g/dL (ref 6.4–8.2)

## 2012-10-05 LAB — CBC
HCT: 39.6 % (ref 35.0–47.0)
HGB: 13.4 g/dL (ref 12.0–16.0)
MCH: 31.3 pg (ref 26.0–34.0)
MCHC: 33.9 g/dL (ref 32.0–36.0)
MCV: 92 fL (ref 80–100)
Platelet: 283 10*3/uL (ref 150–440)
RBC: 4.29 10*6/uL (ref 3.80–5.20)
RDW: 13 % (ref 11.5–14.5)
WBC: 9 10*3/uL (ref 3.6–11.0)

## 2012-10-05 LAB — TROPONIN I: Troponin-I: <0.02 ng/mL

## 2012-10-06 DIAGNOSIS — I447 Left bundle-branch block, unspecified: Secondary | ICD-10-CM | POA: Diagnosis not present

## 2012-10-06 DIAGNOSIS — F411 Generalized anxiety disorder: Secondary | ICD-10-CM | POA: Diagnosis not present

## 2012-10-06 DIAGNOSIS — J45901 Unspecified asthma with (acute) exacerbation: Secondary | ICD-10-CM | POA: Diagnosis not present

## 2012-10-07 DIAGNOSIS — J45901 Unspecified asthma with (acute) exacerbation: Secondary | ICD-10-CM | POA: Diagnosis not present

## 2012-10-07 DIAGNOSIS — F411 Generalized anxiety disorder: Secondary | ICD-10-CM | POA: Diagnosis not present

## 2012-10-07 DIAGNOSIS — I447 Left bundle-branch block, unspecified: Secondary | ICD-10-CM | POA: Diagnosis not present

## 2012-10-17 DIAGNOSIS — J441 Chronic obstructive pulmonary disease with (acute) exacerbation: Secondary | ICD-10-CM | POA: Diagnosis not present

## 2012-10-17 DIAGNOSIS — R0602 Shortness of breath: Secondary | ICD-10-CM | POA: Diagnosis not present

## 2012-10-18 DIAGNOSIS — J441 Chronic obstructive pulmonary disease with (acute) exacerbation: Secondary | ICD-10-CM | POA: Diagnosis not present

## 2012-10-18 DIAGNOSIS — J45901 Unspecified asthma with (acute) exacerbation: Secondary | ICD-10-CM | POA: Diagnosis not present

## 2012-10-18 DIAGNOSIS — R0602 Shortness of breath: Secondary | ICD-10-CM | POA: Diagnosis not present

## 2012-10-21 DIAGNOSIS — F411 Generalized anxiety disorder: Secondary | ICD-10-CM | POA: Diagnosis not present

## 2012-10-21 DIAGNOSIS — J45901 Unspecified asthma with (acute) exacerbation: Secondary | ICD-10-CM | POA: Diagnosis not present

## 2012-10-21 DIAGNOSIS — J449 Chronic obstructive pulmonary disease, unspecified: Secondary | ICD-10-CM | POA: Diagnosis not present

## 2012-10-21 DIAGNOSIS — Z006 Encounter for examination for normal comparison and control in clinical research program: Secondary | ICD-10-CM | POA: Diagnosis not present

## 2012-10-21 DIAGNOSIS — R0602 Shortness of breath: Secondary | ICD-10-CM | POA: Diagnosis not present

## 2012-10-21 DIAGNOSIS — J441 Chronic obstructive pulmonary disease with (acute) exacerbation: Secondary | ICD-10-CM | POA: Diagnosis not present

## 2012-11-02 DIAGNOSIS — R0602 Shortness of breath: Secondary | ICD-10-CM | POA: Diagnosis not present

## 2012-11-04 DIAGNOSIS — D518 Other vitamin B12 deficiency anemias: Secondary | ICD-10-CM | POA: Diagnosis not present

## 2012-11-04 DIAGNOSIS — R5381 Other malaise: Secondary | ICD-10-CM | POA: Diagnosis not present

## 2012-11-22 DIAGNOSIS — J301 Allergic rhinitis due to pollen: Secondary | ICD-10-CM | POA: Diagnosis not present

## 2012-11-22 DIAGNOSIS — J45909 Unspecified asthma, uncomplicated: Secondary | ICD-10-CM | POA: Diagnosis not present

## 2012-11-22 DIAGNOSIS — J449 Chronic obstructive pulmonary disease, unspecified: Secondary | ICD-10-CM | POA: Diagnosis not present

## 2012-11-29 DIAGNOSIS — J309 Allergic rhinitis, unspecified: Secondary | ICD-10-CM | POA: Diagnosis not present

## 2012-12-02 DIAGNOSIS — H169 Unspecified keratitis: Secondary | ICD-10-CM | POA: Diagnosis not present

## 2012-12-06 DIAGNOSIS — J45901 Unspecified asthma with (acute) exacerbation: Secondary | ICD-10-CM | POA: Diagnosis not present

## 2012-12-09 DIAGNOSIS — Z947 Corneal transplant status: Secondary | ICD-10-CM | POA: Diagnosis not present

## 2012-12-19 DIAGNOSIS — J309 Allergic rhinitis, unspecified: Secondary | ICD-10-CM | POA: Diagnosis not present

## 2012-12-20 DIAGNOSIS — I10 Essential (primary) hypertension: Secondary | ICD-10-CM | POA: Diagnosis not present

## 2012-12-20 DIAGNOSIS — R5381 Other malaise: Secondary | ICD-10-CM | POA: Diagnosis not present

## 2012-12-20 DIAGNOSIS — K2970 Gastritis, unspecified, without bleeding: Secondary | ICD-10-CM | POA: Diagnosis not present

## 2012-12-20 DIAGNOSIS — R112 Nausea with vomiting, unspecified: Secondary | ICD-10-CM | POA: Diagnosis not present

## 2012-12-20 DIAGNOSIS — J45909 Unspecified asthma, uncomplicated: Secondary | ICD-10-CM | POA: Diagnosis not present

## 2012-12-20 DIAGNOSIS — J449 Chronic obstructive pulmonary disease, unspecified: Secondary | ICD-10-CM | POA: Diagnosis not present

## 2012-12-20 DIAGNOSIS — R109 Unspecified abdominal pain: Secondary | ICD-10-CM | POA: Diagnosis not present

## 2012-12-20 DIAGNOSIS — J309 Allergic rhinitis, unspecified: Secondary | ICD-10-CM | POA: Diagnosis not present

## 2012-12-28 DIAGNOSIS — J301 Allergic rhinitis due to pollen: Secondary | ICD-10-CM | POA: Diagnosis not present

## 2012-12-28 DIAGNOSIS — J45901 Unspecified asthma with (acute) exacerbation: Secondary | ICD-10-CM | POA: Diagnosis not present

## 2013-01-16 DIAGNOSIS — R51 Headache: Secondary | ICD-10-CM | POA: Diagnosis not present

## 2013-01-16 DIAGNOSIS — K219 Gastro-esophageal reflux disease without esophagitis: Secondary | ICD-10-CM | POA: Diagnosis not present

## 2013-01-16 DIAGNOSIS — R1310 Dysphagia, unspecified: Secondary | ICD-10-CM | POA: Diagnosis not present

## 2013-01-16 DIAGNOSIS — I499 Cardiac arrhythmia, unspecified: Secondary | ICD-10-CM | POA: Diagnosis not present

## 2013-01-16 DIAGNOSIS — K222 Esophageal obstruction: Secondary | ICD-10-CM | POA: Diagnosis not present

## 2013-01-16 DIAGNOSIS — Z9889 Other specified postprocedural states: Secondary | ICD-10-CM | POA: Diagnosis not present

## 2013-01-16 DIAGNOSIS — I10 Essential (primary) hypertension: Secondary | ICD-10-CM | POA: Diagnosis not present

## 2013-01-16 DIAGNOSIS — J45909 Unspecified asthma, uncomplicated: Secondary | ICD-10-CM | POA: Diagnosis not present

## 2013-01-16 DIAGNOSIS — K449 Diaphragmatic hernia without obstruction or gangrene: Secondary | ICD-10-CM | POA: Diagnosis not present

## 2013-01-16 DIAGNOSIS — Z09 Encounter for follow-up examination after completed treatment for conditions other than malignant neoplasm: Secondary | ICD-10-CM | POA: Diagnosis not present

## 2013-01-16 DIAGNOSIS — Z8711 Personal history of peptic ulcer disease: Secondary | ICD-10-CM | POA: Diagnosis not present

## 2013-01-16 DIAGNOSIS — K2940 Chronic atrophic gastritis without bleeding: Secondary | ICD-10-CM | POA: Diagnosis not present

## 2013-01-16 DIAGNOSIS — K2270 Barrett's esophagus without dysplasia: Secondary | ICD-10-CM | POA: Diagnosis not present

## 2013-01-16 DIAGNOSIS — E669 Obesity, unspecified: Secondary | ICD-10-CM | POA: Diagnosis not present

## 2013-01-16 DIAGNOSIS — N3941 Urge incontinence: Secondary | ICD-10-CM | POA: Diagnosis not present

## 2013-01-19 DIAGNOSIS — K2270 Barrett's esophagus without dysplasia: Secondary | ICD-10-CM | POA: Diagnosis not present

## 2013-01-19 DIAGNOSIS — R300 Dysuria: Secondary | ICD-10-CM | POA: Diagnosis not present

## 2013-01-19 DIAGNOSIS — M545 Low back pain, unspecified: Secondary | ICD-10-CM | POA: Diagnosis not present

## 2013-01-19 DIAGNOSIS — Z23 Encounter for immunization: Secondary | ICD-10-CM | POA: Diagnosis not present

## 2013-01-19 DIAGNOSIS — N390 Urinary tract infection, site not specified: Secondary | ICD-10-CM | POA: Diagnosis not present

## 2013-01-19 DIAGNOSIS — M5137 Other intervertebral disc degeneration, lumbosacral region: Secondary | ICD-10-CM | POA: Diagnosis not present

## 2013-01-19 DIAGNOSIS — N200 Calculus of kidney: Secondary | ICD-10-CM | POA: Diagnosis not present

## 2013-01-19 DIAGNOSIS — J45909 Unspecified asthma, uncomplicated: Secondary | ICD-10-CM | POA: Diagnosis not present

## 2013-02-02 DIAGNOSIS — J309 Allergic rhinitis, unspecified: Secondary | ICD-10-CM | POA: Diagnosis not present

## 2013-02-02 DIAGNOSIS — J45909 Unspecified asthma, uncomplicated: Secondary | ICD-10-CM | POA: Diagnosis not present

## 2013-02-02 DIAGNOSIS — R112 Nausea with vomiting, unspecified: Secondary | ICD-10-CM | POA: Diagnosis not present

## 2013-02-02 DIAGNOSIS — N390 Urinary tract infection, site not specified: Secondary | ICD-10-CM | POA: Diagnosis not present

## 2013-02-02 DIAGNOSIS — R809 Proteinuria, unspecified: Secondary | ICD-10-CM | POA: Diagnosis not present

## 2013-02-02 DIAGNOSIS — R109 Unspecified abdominal pain: Secondary | ICD-10-CM | POA: Diagnosis not present

## 2013-02-02 DIAGNOSIS — R300 Dysuria: Secondary | ICD-10-CM | POA: Diagnosis not present

## 2013-02-02 DIAGNOSIS — R5381 Other malaise: Secondary | ICD-10-CM | POA: Diagnosis not present

## 2013-02-02 DIAGNOSIS — I10 Essential (primary) hypertension: Secondary | ICD-10-CM | POA: Diagnosis not present

## 2013-02-09 DIAGNOSIS — J309 Allergic rhinitis, unspecified: Secondary | ICD-10-CM | POA: Diagnosis not present

## 2013-02-09 DIAGNOSIS — R809 Proteinuria, unspecified: Secondary | ICD-10-CM | POA: Diagnosis not present

## 2013-02-23 DIAGNOSIS — E161 Other hypoglycemia: Secondary | ICD-10-CM | POA: Diagnosis not present

## 2013-02-23 DIAGNOSIS — R5381 Other malaise: Secondary | ICD-10-CM | POA: Diagnosis not present

## 2013-02-23 DIAGNOSIS — R110 Nausea: Secondary | ICD-10-CM | POA: Diagnosis not present

## 2013-02-23 DIAGNOSIS — J45909 Unspecified asthma, uncomplicated: Secondary | ICD-10-CM | POA: Diagnosis not present

## 2013-02-23 DIAGNOSIS — I6789 Other cerebrovascular disease: Secondary | ICD-10-CM | POA: Diagnosis not present

## 2013-02-23 DIAGNOSIS — J309 Allergic rhinitis, unspecified: Secondary | ICD-10-CM | POA: Diagnosis not present

## 2013-02-23 DIAGNOSIS — K219 Gastro-esophageal reflux disease without esophagitis: Secondary | ICD-10-CM | POA: Diagnosis not present

## 2013-02-27 DIAGNOSIS — Z0000 Encounter for general adult medical examination without abnormal findings: Secondary | ICD-10-CM | POA: Diagnosis not present

## 2013-02-27 DIAGNOSIS — E559 Vitamin D deficiency, unspecified: Secondary | ICD-10-CM | POA: Diagnosis not present

## 2013-02-27 DIAGNOSIS — R5381 Other malaise: Secondary | ICD-10-CM | POA: Diagnosis not present

## 2013-02-27 DIAGNOSIS — E119 Type 2 diabetes mellitus without complications: Secondary | ICD-10-CM | POA: Diagnosis not present

## 2013-03-03 DIAGNOSIS — J45901 Unspecified asthma with (acute) exacerbation: Secondary | ICD-10-CM | POA: Diagnosis not present

## 2013-03-03 DIAGNOSIS — R0602 Shortness of breath: Secondary | ICD-10-CM | POA: Diagnosis not present

## 2013-03-03 DIAGNOSIS — J209 Acute bronchitis, unspecified: Secondary | ICD-10-CM | POA: Diagnosis not present

## 2013-03-03 DIAGNOSIS — R062 Wheezing: Secondary | ICD-10-CM | POA: Diagnosis not present

## 2013-03-03 DIAGNOSIS — J309 Allergic rhinitis, unspecified: Secondary | ICD-10-CM | POA: Diagnosis not present

## 2013-03-03 DIAGNOSIS — R5381 Other malaise: Secondary | ICD-10-CM | POA: Diagnosis not present

## 2013-03-08 DIAGNOSIS — R0602 Shortness of breath: Secondary | ICD-10-CM | POA: Diagnosis not present

## 2013-03-08 DIAGNOSIS — J45901 Unspecified asthma with (acute) exacerbation: Secondary | ICD-10-CM | POA: Diagnosis not present

## 2013-03-09 DIAGNOSIS — R55 Syncope and collapse: Secondary | ICD-10-CM | POA: Diagnosis not present

## 2013-03-20 DIAGNOSIS — J309 Allergic rhinitis, unspecified: Secondary | ICD-10-CM | POA: Diagnosis not present

## 2013-03-30 DIAGNOSIS — J309 Allergic rhinitis, unspecified: Secondary | ICD-10-CM | POA: Diagnosis not present

## 2013-03-30 DIAGNOSIS — I6529 Occlusion and stenosis of unspecified carotid artery: Secondary | ICD-10-CM | POA: Diagnosis not present

## 2013-04-03 DIAGNOSIS — E559 Vitamin D deficiency, unspecified: Secondary | ICD-10-CM | POA: Diagnosis not present

## 2013-04-03 DIAGNOSIS — R0602 Shortness of breath: Secondary | ICD-10-CM | POA: Diagnosis not present

## 2013-04-03 DIAGNOSIS — G2581 Restless legs syndrome: Secondary | ICD-10-CM | POA: Diagnosis not present

## 2013-04-03 DIAGNOSIS — G43909 Migraine, unspecified, not intractable, without status migrainosus: Secondary | ICD-10-CM | POA: Diagnosis not present

## 2013-04-03 DIAGNOSIS — J449 Chronic obstructive pulmonary disease, unspecified: Secondary | ICD-10-CM | POA: Diagnosis not present

## 2013-04-03 DIAGNOSIS — R252 Cramp and spasm: Secondary | ICD-10-CM | POA: Diagnosis not present

## 2013-04-16 ENCOUNTER — Inpatient Hospital Stay: Payer: Self-pay | Admitting: Internal Medicine

## 2013-04-16 DIAGNOSIS — K219 Gastro-esophageal reflux disease without esophagitis: Secondary | ICD-10-CM | POA: Diagnosis present

## 2013-04-16 DIAGNOSIS — J441 Chronic obstructive pulmonary disease with (acute) exacerbation: Secondary | ICD-10-CM | POA: Diagnosis not present

## 2013-04-16 DIAGNOSIS — J45901 Unspecified asthma with (acute) exacerbation: Secondary | ICD-10-CM | POA: Diagnosis present

## 2013-04-16 DIAGNOSIS — K449 Diaphragmatic hernia without obstruction or gangrene: Secondary | ICD-10-CM | POA: Diagnosis present

## 2013-04-16 DIAGNOSIS — M810 Age-related osteoporosis without current pathological fracture: Secondary | ICD-10-CM | POA: Diagnosis present

## 2013-04-16 DIAGNOSIS — J209 Acute bronchitis, unspecified: Secondary | ICD-10-CM | POA: Diagnosis not present

## 2013-04-16 DIAGNOSIS — I499 Cardiac arrhythmia, unspecified: Secondary | ICD-10-CM | POA: Diagnosis present

## 2013-04-16 DIAGNOSIS — I498 Other specified cardiac arrhythmias: Secondary | ICD-10-CM | POA: Diagnosis not present

## 2013-04-16 DIAGNOSIS — I10 Essential (primary) hypertension: Secondary | ICD-10-CM | POA: Diagnosis present

## 2013-04-16 DIAGNOSIS — R000 Tachycardia, unspecified: Secondary | ICD-10-CM | POA: Diagnosis not present

## 2013-04-16 DIAGNOSIS — G43909 Migraine, unspecified, not intractable, without status migrainosus: Secondary | ICD-10-CM | POA: Diagnosis present

## 2013-04-16 DIAGNOSIS — R0602 Shortness of breath: Secondary | ICD-10-CM | POA: Diagnosis not present

## 2013-04-16 DIAGNOSIS — J9600 Acute respiratory failure, unspecified whether with hypoxia or hypercapnia: Secondary | ICD-10-CM | POA: Diagnosis not present

## 2013-04-16 DIAGNOSIS — Z8614 Personal history of Methicillin resistant Staphylococcus aureus infection: Secondary | ICD-10-CM | POA: Diagnosis not present

## 2013-04-16 DIAGNOSIS — M1990 Unspecified osteoarthritis, unspecified site: Secondary | ICD-10-CM | POA: Diagnosis present

## 2013-04-16 DIAGNOSIS — Z888 Allergy status to other drugs, medicaments and biological substances status: Secondary | ICD-10-CM | POA: Diagnosis not present

## 2013-04-16 DIAGNOSIS — K2270 Barrett's esophagus without dysplasia: Secondary | ICD-10-CM | POA: Diagnosis present

## 2013-04-16 DIAGNOSIS — R7309 Other abnormal glucose: Secondary | ICD-10-CM | POA: Diagnosis not present

## 2013-04-16 DIAGNOSIS — J9819 Other pulmonary collapse: Secondary | ICD-10-CM | POA: Diagnosis not present

## 2013-04-16 DIAGNOSIS — Z881 Allergy status to other antibiotic agents status: Secondary | ICD-10-CM | POA: Diagnosis not present

## 2013-04-16 DIAGNOSIS — F411 Generalized anxiety disorder: Secondary | ICD-10-CM | POA: Diagnosis not present

## 2013-04-16 DIAGNOSIS — F329 Major depressive disorder, single episode, unspecified: Secondary | ICD-10-CM | POA: Diagnosis not present

## 2013-04-16 DIAGNOSIS — K208 Other esophagitis without bleeding: Secondary | ICD-10-CM | POA: Diagnosis present

## 2013-04-16 DIAGNOSIS — G2581 Restless legs syndrome: Secondary | ICD-10-CM | POA: Diagnosis present

## 2013-04-16 LAB — COMPREHENSIVE METABOLIC PANEL
Albumin: 3.5 g/dL (ref 3.4–5.0)
Alkaline Phosphatase: 93 U/L
Anion Gap: 4 — ABNORMAL LOW (ref 7–16)
BUN: 18 mg/dL (ref 7–18)
Bilirubin,Total: 0.3 mg/dL (ref 0.2–1.0)
Calcium, Total: 9 mg/dL (ref 8.5–10.1)
Chloride: 102 mmol/L (ref 98–107)
Co2: 30 mmol/L (ref 21–32)
Creatinine: 0.69 mg/dL (ref 0.60–1.30)
EGFR (African American): >60
EGFR (Non-African Amer.): >60
Glucose: 101 mg/dL — ABNORMAL HIGH (ref 65–99)
Osmolality: 274 (ref 275–301)
Potassium: 3.9 mmol/L (ref 3.5–5.1)
SGOT(AST): 18 U/L (ref 15–37)
SGPT (ALT): 20 U/L (ref 12–78)
Sodium: 136 mmol/L (ref 136–145)
Total Protein: 6.9 g/dL (ref 6.4–8.2)

## 2013-04-16 LAB — CBC
HCT: 42 % (ref 35.0–47.0)
HGB: 13.8 g/dL (ref 12.0–16.0)
MCH: 30.3 pg (ref 26.0–34.0)
MCHC: 32.9 g/dL (ref 32.0–36.0)
MCV: 92 fL (ref 80–100)
Platelet: 278 10*3/uL (ref 150–440)
RBC: 4.56 10*6/uL (ref 3.80–5.20)
RDW: 14.1 % (ref 11.5–14.5)
WBC: 9 10*3/uL (ref 3.6–11.0)

## 2013-04-16 LAB — TROPONIN I: Troponin-I: <0.02 ng/mL

## 2013-04-17 LAB — CBC WITH DIFFERENTIAL/PLATELET
Basophil #: 0 10*3/uL (ref 0.0–0.1)
Basophil %: 0.2 %
Eosinophil #: 0 10*3/uL (ref 0.0–0.7)
Eosinophil %: 0 %
HCT: 39.3 % (ref 35.0–47.0)
HGB: 12.7 g/dL (ref 12.0–16.0)
Lymphocyte #: 0.6 10*3/uL — ABNORMAL LOW (ref 1.0–3.6)
Lymphocyte %: 5.3 %
MCH: 29.9 pg (ref 26.0–34.0)
MCHC: 32.5 g/dL (ref 32.0–36.0)
MCV: 92 fL (ref 80–100)
Monocyte #: 0 x10 3/mm — ABNORMAL LOW (ref 0.2–0.9)
Monocyte %: 0.4 %
Neutrophil #: 10.5 10*3/uL — ABNORMAL HIGH (ref 1.4–6.5)
Neutrophil %: 94.1 %
Platelet: 251 10*3/uL (ref 150–440)
RBC: 4.26 10*6/uL (ref 3.80–5.20)
RDW: 14.1 % (ref 11.5–14.5)
WBC: 11.1 10*3/uL — ABNORMAL HIGH (ref 3.6–11.0)

## 2013-04-17 LAB — BASIC METABOLIC PANEL
Anion Gap: 4 — ABNORMAL LOW (ref 7–16)
BUN: 15 mg/dL (ref 7–18)
Calcium, Total: 8.2 mg/dL — ABNORMAL LOW (ref 8.5–10.1)
Chloride: 104 mmol/L (ref 98–107)
Co2: 26 mmol/L (ref 21–32)
Creatinine: 0.59 mg/dL — ABNORMAL LOW (ref 0.60–1.30)
EGFR (African American): >60
EGFR (Non-African Amer.): >60
Glucose: 161 mg/dL — ABNORMAL HIGH (ref 65–99)
Osmolality: 273 (ref 275–301)
Potassium: 3.7 mmol/L (ref 3.5–5.1)
Sodium: 134 mmol/L — ABNORMAL LOW (ref 136–145)

## 2013-04-23 ENCOUNTER — Emergency Department: Payer: Self-pay | Admitting: Internal Medicine

## 2013-04-23 DIAGNOSIS — N201 Calculus of ureter: Secondary | ICD-10-CM | POA: Diagnosis not present

## 2013-04-23 DIAGNOSIS — I10 Essential (primary) hypertension: Secondary | ICD-10-CM | POA: Diagnosis not present

## 2013-04-23 DIAGNOSIS — J45909 Unspecified asthma, uncomplicated: Secondary | ICD-10-CM | POA: Diagnosis not present

## 2013-04-23 DIAGNOSIS — N23 Unspecified renal colic: Secondary | ICD-10-CM | POA: Diagnosis not present

## 2013-04-23 DIAGNOSIS — R112 Nausea with vomiting, unspecified: Secondary | ICD-10-CM | POA: Diagnosis not present

## 2013-04-23 DIAGNOSIS — F3289 Other specified depressive episodes: Secondary | ICD-10-CM | POA: Diagnosis not present

## 2013-04-23 DIAGNOSIS — J449 Chronic obstructive pulmonary disease, unspecified: Secondary | ICD-10-CM | POA: Diagnosis not present

## 2013-04-23 DIAGNOSIS — N200 Calculus of kidney: Secondary | ICD-10-CM | POA: Diagnosis not present

## 2013-04-23 DIAGNOSIS — F329 Major depressive disorder, single episode, unspecified: Secondary | ICD-10-CM | POA: Diagnosis not present

## 2013-04-23 DIAGNOSIS — M810 Age-related osteoporosis without current pathological fracture: Secondary | ICD-10-CM | POA: Diagnosis not present

## 2013-04-23 DIAGNOSIS — Z9089 Acquired absence of other organs: Secondary | ICD-10-CM | POA: Diagnosis not present

## 2013-04-23 DIAGNOSIS — F411 Generalized anxiety disorder: Secondary | ICD-10-CM | POA: Diagnosis not present

## 2013-04-23 DIAGNOSIS — D649 Anemia, unspecified: Secondary | ICD-10-CM | POA: Diagnosis not present

## 2013-04-23 LAB — URINALYSIS, COMPLETE
Bacteria: NONE SEEN
Bilirubin,UR: NEGATIVE
Glucose,UR: NEGATIVE mg/dL (ref 0–75)
Ketone: NEGATIVE
Leukocyte Esterase: NEGATIVE
Nitrite: NEGATIVE
Ph: 5 (ref 4.5–8.0)
Protein: NEGATIVE
RBC,UR: 27 /HPF (ref 0–5)
Specific Gravity: 1.026 (ref 1.003–1.030)
Squamous Epithelial: 4
WBC UR: 7 /HPF (ref 0–5)

## 2013-04-23 LAB — COMPREHENSIVE METABOLIC PANEL
Albumin: 3.6 g/dL (ref 3.4–5.0)
Alkaline Phosphatase: 76 U/L
Anion Gap: 7 (ref 7–16)
BUN: 19 mg/dL — ABNORMAL HIGH (ref 7–18)
Bilirubin,Total: 0.3 mg/dL (ref 0.2–1.0)
Calcium, Total: 9.2 mg/dL (ref 8.5–10.1)
Chloride: 100 mmol/L (ref 98–107)
Co2: 30 mmol/L (ref 21–32)
Creatinine: 0.63 mg/dL (ref 0.60–1.30)
EGFR (African American): >60
EGFR (Non-African Amer.): >60
Glucose: 95 mg/dL (ref 65–99)
Osmolality: 276 (ref 275–301)
Potassium: 4.8 mmol/L (ref 3.5–5.1)
SGOT(AST): 20 U/L (ref 15–37)
SGPT (ALT): 36 U/L (ref 12–78)
Sodium: 137 mmol/L (ref 136–145)
Total Protein: 6.8 g/dL (ref 6.4–8.2)

## 2013-04-23 LAB — CBC
HCT: 41.4 % (ref 35.0–47.0)
HGB: 14 g/dL (ref 12.0–16.0)
MCH: 30.9 pg (ref 26.0–34.0)
MCHC: 33.8 g/dL (ref 32.0–36.0)
MCV: 91 fL (ref 80–100)
Platelet: 215 10*3/uL (ref 150–440)
RBC: 4.53 10*6/uL (ref 3.80–5.20)
RDW: 14.1 % (ref 11.5–14.5)
WBC: 12.1 10*3/uL — ABNORMAL HIGH (ref 3.6–11.0)

## 2013-04-24 ENCOUNTER — Emergency Department: Payer: Self-pay | Admitting: Emergency Medicine

## 2013-04-24 DIAGNOSIS — Z90710 Acquired absence of both cervix and uterus: Secondary | ICD-10-CM | POA: Diagnosis not present

## 2013-04-24 DIAGNOSIS — R112 Nausea with vomiting, unspecified: Secondary | ICD-10-CM | POA: Diagnosis not present

## 2013-04-24 DIAGNOSIS — N201 Calculus of ureter: Secondary | ICD-10-CM | POA: Diagnosis not present

## 2013-04-24 DIAGNOSIS — J449 Chronic obstructive pulmonary disease, unspecified: Secondary | ICD-10-CM | POA: Diagnosis not present

## 2013-04-24 DIAGNOSIS — Z862 Personal history of diseases of the blood and blood-forming organs and certain disorders involving the immune mechanism: Secondary | ICD-10-CM | POA: Diagnosis not present

## 2013-04-24 DIAGNOSIS — N200 Calculus of kidney: Secondary | ICD-10-CM | POA: Diagnosis not present

## 2013-04-24 DIAGNOSIS — I10 Essential (primary) hypertension: Secondary | ICD-10-CM | POA: Diagnosis not present

## 2013-04-24 DIAGNOSIS — Z87442 Personal history of urinary calculi: Secondary | ICD-10-CM | POA: Diagnosis not present

## 2013-04-24 DIAGNOSIS — Z9089 Acquired absence of other organs: Secondary | ICD-10-CM | POA: Diagnosis not present

## 2013-04-24 DIAGNOSIS — N23 Unspecified renal colic: Secondary | ICD-10-CM | POA: Diagnosis not present

## 2013-04-24 LAB — CBC
HCT: 38.2 % (ref 35.0–47.0)
HGB: 12.7 g/dL (ref 12.0–16.0)
MCH: 30.8 pg (ref 26.0–34.0)
MCHC: 33.1 g/dL (ref 32.0–36.0)
MCV: 93 fL (ref 80–100)
Platelet: 206 10*3/uL (ref 150–440)
RBC: 4.11 10*6/uL (ref 3.80–5.20)
RDW: 14.1 % (ref 11.5–14.5)
WBC: 14.9 10*3/uL — ABNORMAL HIGH (ref 3.6–11.0)

## 2013-04-24 LAB — URINALYSIS, COMPLETE
Bacteria: NONE SEEN
Bilirubin,UR: NEGATIVE
Glucose,UR: NEGATIVE mg/dL (ref 0–75)
Ketone: NEGATIVE
Nitrite: NEGATIVE
Ph: 5 (ref 4.5–8.0)
Protein: NEGATIVE
RBC,UR: 55 /HPF (ref 0–5)
Specific Gravity: 1.02 (ref 1.003–1.030)
Squamous Epithelial: 2
WBC UR: 5 /HPF (ref 0–5)

## 2013-04-24 LAB — COMPREHENSIVE METABOLIC PANEL
Albumin: 3.3 g/dL — ABNORMAL LOW (ref 3.4–5.0)
Alkaline Phosphatase: 79 U/L
Anion Gap: 3 — ABNORMAL LOW (ref 7–16)
BUN: 12 mg/dL (ref 7–18)
Bilirubin,Total: 0.2 mg/dL (ref 0.2–1.0)
Calcium, Total: 8.7 mg/dL (ref 8.5–10.1)
Chloride: 103 mmol/L (ref 98–107)
Co2: 32 mmol/L (ref 21–32)
Creatinine: 0.83 mg/dL (ref 0.60–1.30)
EGFR (African American): >60
EGFR (Non-African Amer.): >60
Glucose: 131 mg/dL — ABNORMAL HIGH (ref 65–99)
Osmolality: 277 (ref 275–301)
Potassium: 3.7 mmol/L (ref 3.5–5.1)
SGOT(AST): 19 U/L (ref 15–37)
SGPT (ALT): 33 U/L (ref 12–78)
Sodium: 138 mmol/L (ref 136–145)
Total Protein: 6.6 g/dL (ref 6.4–8.2)

## 2013-04-26 ENCOUNTER — Ambulatory Visit: Payer: Self-pay | Admitting: Urology

## 2013-04-26 DIAGNOSIS — N3941 Urge incontinence: Secondary | ICD-10-CM | POA: Diagnosis not present

## 2013-04-26 DIAGNOSIS — N201 Calculus of ureter: Secondary | ICD-10-CM | POA: Diagnosis not present

## 2013-04-26 DIAGNOSIS — N23 Unspecified renal colic: Secondary | ICD-10-CM | POA: Diagnosis not present

## 2013-04-26 DIAGNOSIS — R319 Hematuria, unspecified: Secondary | ICD-10-CM | POA: Diagnosis not present

## 2013-04-26 DIAGNOSIS — N200 Calculus of kidney: Secondary | ICD-10-CM | POA: Diagnosis not present

## 2013-04-26 DIAGNOSIS — R109 Unspecified abdominal pain: Secondary | ICD-10-CM | POA: Diagnosis not present

## 2013-05-01 DIAGNOSIS — J441 Chronic obstructive pulmonary disease with (acute) exacerbation: Secondary | ICD-10-CM | POA: Diagnosis not present

## 2013-05-01 DIAGNOSIS — J45909 Unspecified asthma, uncomplicated: Secondary | ICD-10-CM | POA: Diagnosis not present

## 2013-05-01 DIAGNOSIS — R062 Wheezing: Secondary | ICD-10-CM | POA: Diagnosis not present

## 2013-05-01 DIAGNOSIS — J449 Chronic obstructive pulmonary disease, unspecified: Secondary | ICD-10-CM | POA: Diagnosis not present

## 2013-05-01 DIAGNOSIS — B029 Zoster without complications: Secondary | ICD-10-CM | POA: Diagnosis not present

## 2013-05-01 DIAGNOSIS — R0602 Shortness of breath: Secondary | ICD-10-CM | POA: Diagnosis not present

## 2013-05-01 DIAGNOSIS — J45901 Unspecified asthma with (acute) exacerbation: Secondary | ICD-10-CM | POA: Diagnosis not present

## 2013-05-01 DIAGNOSIS — R059 Cough, unspecified: Secondary | ICD-10-CM | POA: Diagnosis not present

## 2013-05-01 DIAGNOSIS — R05 Cough: Secondary | ICD-10-CM | POA: Diagnosis not present

## 2013-05-02 DIAGNOSIS — B0052 Herpesviral keratitis: Secondary | ICD-10-CM | POA: Diagnosis not present

## 2013-05-04 DIAGNOSIS — N200 Calculus of kidney: Secondary | ICD-10-CM | POA: Diagnosis not present

## 2013-05-06 DIAGNOSIS — R5381 Other malaise: Secondary | ICD-10-CM | POA: Diagnosis not present

## 2013-05-06 DIAGNOSIS — J449 Chronic obstructive pulmonary disease, unspecified: Secondary | ICD-10-CM | POA: Diagnosis not present

## 2013-05-06 DIAGNOSIS — R0602 Shortness of breath: Secondary | ICD-10-CM | POA: Diagnosis not present

## 2013-05-06 DIAGNOSIS — R5383 Other fatigue: Secondary | ICD-10-CM | POA: Diagnosis not present

## 2013-06-08 DIAGNOSIS — B029 Zoster without complications: Secondary | ICD-10-CM | POA: Diagnosis not present

## 2013-06-08 DIAGNOSIS — R0602 Shortness of breath: Secondary | ICD-10-CM | POA: Diagnosis not present

## 2013-06-08 DIAGNOSIS — J449 Chronic obstructive pulmonary disease, unspecified: Secondary | ICD-10-CM | POA: Diagnosis not present

## 2013-06-12 ENCOUNTER — Inpatient Hospital Stay: Payer: Self-pay | Admitting: Internal Medicine

## 2013-06-12 DIAGNOSIS — R0602 Shortness of breath: Secondary | ICD-10-CM | POA: Diagnosis not present

## 2013-06-12 DIAGNOSIS — E872 Acidosis, unspecified: Secondary | ICD-10-CM | POA: Diagnosis not present

## 2013-06-12 DIAGNOSIS — I10 Essential (primary) hypertension: Secondary | ICD-10-CM | POA: Diagnosis not present

## 2013-06-12 DIAGNOSIS — I498 Other specified cardiac arrhythmias: Secondary | ICD-10-CM | POA: Diagnosis present

## 2013-06-12 DIAGNOSIS — J441 Chronic obstructive pulmonary disease with (acute) exacerbation: Secondary | ICD-10-CM | POA: Diagnosis present

## 2013-06-12 DIAGNOSIS — Z886 Allergy status to analgesic agent status: Secondary | ICD-10-CM | POA: Diagnosis not present

## 2013-06-12 DIAGNOSIS — F411 Generalized anxiety disorder: Secondary | ICD-10-CM | POA: Diagnosis not present

## 2013-06-12 DIAGNOSIS — I479 Paroxysmal tachycardia, unspecified: Secondary | ICD-10-CM | POA: Diagnosis not present

## 2013-06-12 DIAGNOSIS — F3289 Other specified depressive episodes: Secondary | ICD-10-CM | POA: Diagnosis present

## 2013-06-12 DIAGNOSIS — M810 Age-related osteoporosis without current pathological fracture: Secondary | ICD-10-CM | POA: Diagnosis present

## 2013-06-12 DIAGNOSIS — J9600 Acute respiratory failure, unspecified whether with hypoxia or hypercapnia: Secondary | ICD-10-CM | POA: Diagnosis not present

## 2013-06-12 DIAGNOSIS — J45901 Unspecified asthma with (acute) exacerbation: Secondary | ICD-10-CM | POA: Diagnosis not present

## 2013-06-12 DIAGNOSIS — E876 Hypokalemia: Secondary | ICD-10-CM | POA: Diagnosis present

## 2013-06-12 DIAGNOSIS — I509 Heart failure, unspecified: Secondary | ICD-10-CM | POA: Diagnosis not present

## 2013-06-12 DIAGNOSIS — F329 Major depressive disorder, single episode, unspecified: Secondary | ICD-10-CM | POA: Diagnosis present

## 2013-06-12 DIAGNOSIS — G8929 Other chronic pain: Secondary | ICD-10-CM | POA: Diagnosis present

## 2013-06-12 DIAGNOSIS — I059 Rheumatic mitral valve disease, unspecified: Secondary | ICD-10-CM | POA: Diagnosis not present

## 2013-06-12 DIAGNOSIS — J45902 Unspecified asthma with status asthmaticus: Secondary | ICD-10-CM | POA: Diagnosis not present

## 2013-06-12 DIAGNOSIS — E785 Hyperlipidemia, unspecified: Secondary | ICD-10-CM | POA: Diagnosis present

## 2013-06-12 LAB — CBC WITH DIFFERENTIAL/PLATELET
Basophil #: 0.1 10*3/uL (ref 0.0–0.1)
Basophil %: 0.9 %
Eosinophil #: 0.2 10*3/uL (ref 0.0–0.7)
Eosinophil %: 2 %
HCT: 44.8 % (ref 35.0–47.0)
HGB: 14.7 g/dL (ref 12.0–16.0)
Lymphocyte #: 3.4 10*3/uL (ref 1.0–3.6)
Lymphocyte %: 30.7 %
MCH: 29.9 pg (ref 26.0–34.0)
MCHC: 32.8 g/dL (ref 32.0–36.0)
MCV: 91 fL (ref 80–100)
Monocyte #: 0.5 x10 3/mm (ref 0.2–0.9)
Monocyte %: 4.7 %
Neutrophil #: 6.8 10*3/uL — ABNORMAL HIGH (ref 1.4–6.5)
Neutrophil %: 61.7 %
Platelet: 320 10*3/uL (ref 150–440)
RBC: 4.92 10*6/uL (ref 3.80–5.20)
RDW: 14.5 % (ref 11.5–14.5)
WBC: 11.1 10*3/uL — ABNORMAL HIGH (ref 3.6–11.0)

## 2013-06-12 LAB — BASIC METABOLIC PANEL
Anion Gap: 11 (ref 7–16)
BUN: 22 mg/dL — ABNORMAL HIGH (ref 7–18)
Calcium, Total: 10.8 mg/dL — ABNORMAL HIGH (ref 8.5–10.1)
Chloride: 102 mmol/L (ref 98–107)
Co2: 23 mmol/L (ref 21–32)
Creatinine: 0.81 mg/dL (ref 0.60–1.30)
EGFR (African American): >60
EGFR (Non-African Amer.): >60
Glucose: 104 mg/dL — ABNORMAL HIGH (ref 65–99)
Osmolality: 276 (ref 275–301)
Potassium: 3.7 mmol/L (ref 3.5–5.1)
Sodium: 136 mmol/L (ref 136–145)

## 2013-06-12 LAB — TROPONIN I: Troponin-I: <0.02 ng/mL

## 2013-06-13 DIAGNOSIS — I059 Rheumatic mitral valve disease, unspecified: Secondary | ICD-10-CM

## 2013-06-13 LAB — CBC WITH DIFFERENTIAL/PLATELET
Basophil #: 0 10*3/uL (ref 0.0–0.1)
Basophil %: 0.2 %
Eosinophil #: 0 10*3/uL (ref 0.0–0.7)
Eosinophil %: 0 %
HCT: 39.6 % (ref 35.0–47.0)
HGB: 13.6 g/dL (ref 12.0–16.0)
Lymphocyte #: 0.6 10*3/uL — ABNORMAL LOW (ref 1.0–3.6)
Lymphocyte %: 5.5 %
MCH: 30.9 pg (ref 26.0–34.0)
MCHC: 34.4 g/dL (ref 32.0–36.0)
MCV: 90 fL (ref 80–100)
Monocyte #: 0.1 x10 3/mm — ABNORMAL LOW (ref 0.2–0.9)
Monocyte %: 0.7 %
Neutrophil #: 11 10*3/uL — ABNORMAL HIGH (ref 1.4–6.5)
Neutrophil %: 93.6 %
Platelet: 311 10*3/uL (ref 150–440)
RBC: 4.41 10*6/uL (ref 3.80–5.20)
RDW: 14.6 % — ABNORMAL HIGH (ref 11.5–14.5)
WBC: 11.8 10*3/uL — ABNORMAL HIGH (ref 3.6–11.0)

## 2013-06-13 LAB — BASIC METABOLIC PANEL
Anion Gap: 16 (ref 7–16)
BUN: 24 mg/dL — ABNORMAL HIGH (ref 7–18)
Calcium, Total: 9.9 mg/dL (ref 8.5–10.1)
Chloride: 102 mmol/L (ref 98–107)
Co2: 17 mmol/L — ABNORMAL LOW (ref 21–32)
Creatinine: 1.13 mg/dL (ref 0.60–1.30)
EGFR (African American): >60
EGFR (Non-African Amer.): 54 — ABNORMAL LOW
Glucose: 179 mg/dL — ABNORMAL HIGH (ref 65–99)
Osmolality: 279 (ref 275–301)
Potassium: 3.1 mmol/L — ABNORMAL LOW (ref 3.5–5.1)
Sodium: 135 mmol/L — ABNORMAL LOW (ref 136–145)

## 2013-06-13 LAB — URINALYSIS, COMPLETE
Bacteria: NONE SEEN
Bilirubin,UR: NEGATIVE
Blood: NEGATIVE
Glucose,UR: NEGATIVE mg/dL (ref 0–75)
Hyaline Cast: 5
Leukocyte Esterase: NEGATIVE
Nitrite: NEGATIVE
Ph: 5 (ref 4.5–8.0)
Protein: NEGATIVE
RBC,UR: 1 /HPF (ref 0–5)
Specific Gravity: 1.01 (ref 1.003–1.030)
Squamous Epithelial: NONE SEEN
WBC UR: <1 /HPF (ref 0–5)

## 2013-06-13 LAB — DRUG SCREEN, URINE
Amphetamines, Ur Screen: NEGATIVE (ref ?–1000)
Barbiturates, Ur Screen: POSITIVE (ref ?–200)
Benzodiazepine, Ur Scrn: POSITIVE (ref ?–200)
Cannabinoid 50 Ng, Ur ~~LOC~~: NEGATIVE (ref ?–50)
Cocaine Metabolite,Ur ~~LOC~~: NEGATIVE (ref ?–300)
MDMA (Ecstasy)Ur Screen: NEGATIVE (ref ?–500)
Methadone, Ur Screen: NEGATIVE (ref ?–300)
Opiate, Ur Screen: POSITIVE (ref ?–300)
Phencyclidine (PCP) Ur S: NEGATIVE (ref ?–25)
Tricyclic, Ur Screen: POSITIVE (ref ?–1000)

## 2013-06-13 LAB — MAGNESIUM: Magnesium: 1.7 mg/dL — ABNORMAL LOW

## 2013-06-14 LAB — MAGNESIUM: Magnesium: 2.1 mg/dL

## 2013-06-14 LAB — BASIC METABOLIC PANEL
Anion Gap: 6 — ABNORMAL LOW (ref 7–16)
BUN: 29 mg/dL — ABNORMAL HIGH (ref 7–18)
Calcium, Total: 8.6 mg/dL (ref 8.5–10.1)
Chloride: 102 mmol/L (ref 98–107)
Co2: 26 mmol/L (ref 21–32)
Creatinine: 0.79 mg/dL (ref 0.60–1.30)
EGFR (African American): >60
EGFR (Non-African Amer.): >60
Glucose: 177 mg/dL — ABNORMAL HIGH (ref 65–99)
Osmolality: 278 (ref 275–301)
Potassium: 4.3 mmol/L (ref 3.5–5.1)
Sodium: 134 mmol/L — ABNORMAL LOW (ref 136–145)

## 2013-07-17 DIAGNOSIS — R0602 Shortness of breath: Secondary | ICD-10-CM | POA: Diagnosis not present

## 2013-07-17 DIAGNOSIS — G2581 Restless legs syndrome: Secondary | ICD-10-CM | POA: Diagnosis not present

## 2013-07-17 DIAGNOSIS — J309 Allergic rhinitis, unspecified: Secondary | ICD-10-CM | POA: Diagnosis not present

## 2013-07-17 DIAGNOSIS — J449 Chronic obstructive pulmonary disease, unspecified: Secondary | ICD-10-CM | POA: Diagnosis not present

## 2013-07-17 DIAGNOSIS — G4710 Hypersomnia, unspecified: Secondary | ICD-10-CM | POA: Diagnosis not present

## 2013-07-17 DIAGNOSIS — R5383 Other fatigue: Secondary | ICD-10-CM | POA: Diagnosis not present

## 2013-07-17 DIAGNOSIS — R5381 Other malaise: Secondary | ICD-10-CM | POA: Diagnosis not present

## 2013-07-18 DIAGNOSIS — E559 Vitamin D deficiency, unspecified: Secondary | ICD-10-CM | POA: Diagnosis not present

## 2013-07-18 DIAGNOSIS — R7989 Other specified abnormal findings of blood chemistry: Secondary | ICD-10-CM | POA: Diagnosis not present

## 2013-07-18 DIAGNOSIS — D518 Other vitamin B12 deficiency anemias: Secondary | ICD-10-CM | POA: Diagnosis not present

## 2013-07-18 DIAGNOSIS — R5381 Other malaise: Secondary | ICD-10-CM | POA: Diagnosis not present

## 2013-07-18 DIAGNOSIS — D649 Anemia, unspecified: Secondary | ICD-10-CM | POA: Diagnosis not present

## 2013-07-18 DIAGNOSIS — R5383 Other fatigue: Secondary | ICD-10-CM | POA: Diagnosis not present

## 2013-07-18 DIAGNOSIS — G2581 Restless legs syndrome: Secondary | ICD-10-CM | POA: Diagnosis not present

## 2013-07-25 DIAGNOSIS — G4710 Hypersomnia, unspecified: Secondary | ICD-10-CM | POA: Diagnosis not present

## 2013-08-01 DIAGNOSIS — F5105 Insomnia due to other mental disorder: Secondary | ICD-10-CM | POA: Diagnosis not present

## 2013-08-01 DIAGNOSIS — J449 Chronic obstructive pulmonary disease, unspecified: Secondary | ICD-10-CM | POA: Diagnosis not present

## 2013-08-01 DIAGNOSIS — R5381 Other malaise: Secondary | ICD-10-CM | POA: Diagnosis not present

## 2013-08-01 DIAGNOSIS — G4710 Hypersomnia, unspecified: Secondary | ICD-10-CM | POA: Diagnosis not present

## 2013-08-01 DIAGNOSIS — R5383 Other fatigue: Secondary | ICD-10-CM | POA: Diagnosis not present

## 2013-08-01 DIAGNOSIS — F489 Nonpsychotic mental disorder, unspecified: Secondary | ICD-10-CM | POA: Diagnosis not present

## 2013-09-20 DIAGNOSIS — M25569 Pain in unspecified knee: Secondary | ICD-10-CM | POA: Diagnosis not present

## 2013-09-20 DIAGNOSIS — IMO0002 Reserved for concepts with insufficient information to code with codable children: Secondary | ICD-10-CM | POA: Diagnosis not present

## 2013-09-20 DIAGNOSIS — M1710 Unilateral primary osteoarthritis, unspecified knee: Secondary | ICD-10-CM | POA: Diagnosis not present

## 2013-09-25 ENCOUNTER — Ambulatory Visit: Payer: Self-pay | Admitting: Orthopedic Surgery

## 2013-09-25 DIAGNOSIS — M1710 Unilateral primary osteoarthritis, unspecified knee: Secondary | ICD-10-CM | POA: Diagnosis not present

## 2013-09-25 DIAGNOSIS — IMO0002 Reserved for concepts with insufficient information to code with codable children: Secondary | ICD-10-CM | POA: Diagnosis not present

## 2013-10-17 ENCOUNTER — Ambulatory Visit: Payer: Self-pay | Admitting: Orthopedic Surgery

## 2013-10-17 DIAGNOSIS — D62 Acute posthemorrhagic anemia: Secondary | ICD-10-CM | POA: Diagnosis not present

## 2013-10-17 DIAGNOSIS — Z01812 Encounter for preprocedural laboratory examination: Secondary | ICD-10-CM | POA: Diagnosis not present

## 2013-10-17 DIAGNOSIS — M069 Rheumatoid arthritis, unspecified: Secondary | ICD-10-CM | POA: Diagnosis not present

## 2013-10-17 DIAGNOSIS — IMO0002 Reserved for concepts with insufficient information to code with codable children: Secondary | ICD-10-CM | POA: Diagnosis not present

## 2013-10-17 DIAGNOSIS — Z79899 Other long term (current) drug therapy: Secondary | ICD-10-CM | POA: Diagnosis not present

## 2013-10-17 DIAGNOSIS — M79609 Pain in unspecified limb: Secondary | ICD-10-CM | POA: Diagnosis not present

## 2013-10-17 LAB — BASIC METABOLIC PANEL
Anion Gap: 7 (ref 7–16)
BUN: 12 mg/dL (ref 7–18)
Calcium, Total: 9.3 mg/dL (ref 8.5–10.1)
Chloride: 104 mmol/L (ref 98–107)
Co2: 29 mmol/L (ref 21–32)
Creatinine: 0.72 mg/dL (ref 0.60–1.30)
EGFR (African American): >60
EGFR (Non-African Amer.): >60
Glucose: 112 mg/dL — ABNORMAL HIGH (ref 65–99)
Osmolality: 280 (ref 275–301)
Potassium: 4 mmol/L (ref 3.5–5.1)
Sodium: 140 mmol/L (ref 136–145)

## 2013-10-17 LAB — URINALYSIS, COMPLETE
Bacteria: NONE SEEN
Bilirubin,UR: NEGATIVE
Blood: NEGATIVE
Glucose,UR: NEGATIVE mg/dL (ref 0–75)
Hyaline Cast: 2
Ketone: NEGATIVE
Leukocyte Esterase: NEGATIVE
Nitrite: NEGATIVE
Ph: 5 (ref 4.5–8.0)
Protein: NEGATIVE
RBC,UR: 11 /HPF (ref 0–5)
Specific Gravity: 1.021 (ref 1.003–1.030)
Squamous Epithelial: 4
WBC UR: 1 /HPF (ref 0–5)

## 2013-10-17 LAB — CBC
HCT: 39.5 % (ref 35.0–47.0)
HGB: 12.7 g/dL (ref 12.0–16.0)
MCH: 28.3 pg (ref 26.0–34.0)
MCHC: 32.2 g/dL (ref 32.0–36.0)
MCV: 88 fL (ref 80–100)
Platelet: 273 10*3/uL (ref 150–440)
RBC: 4.5 10*6/uL (ref 3.80–5.20)
RDW: 14.4 % (ref 11.5–14.5)
WBC: 8.8 10*3/uL (ref 3.6–11.0)

## 2013-10-17 LAB — MRSA PCR SCREENING

## 2013-10-17 LAB — PROTIME-INR
INR: 0.9
Prothrombin Time: 12.2 secs (ref 11.5–14.7)

## 2013-10-17 LAB — APTT: Activated PTT: 26.7 secs (ref 23.6–35.9)

## 2013-10-17 LAB — SEDIMENTATION RATE: Erythrocyte Sed Rate: 7 mm/hr (ref 0–30)

## 2013-10-20 LAB — URINE CULTURE

## 2013-10-26 ENCOUNTER — Inpatient Hospital Stay: Payer: Self-pay | Admitting: Orthopedic Surgery

## 2013-10-26 DIAGNOSIS — G5790 Unspecified mononeuropathy of unspecified lower limb: Secondary | ICD-10-CM | POA: Diagnosis not present

## 2013-10-26 DIAGNOSIS — F3289 Other specified depressive episodes: Secondary | ICD-10-CM | POA: Diagnosis present

## 2013-10-26 DIAGNOSIS — E119 Type 2 diabetes mellitus without complications: Secondary | ICD-10-CM | POA: Diagnosis not present

## 2013-10-26 DIAGNOSIS — F329 Major depressive disorder, single episode, unspecified: Secondary | ICD-10-CM | POA: Diagnosis present

## 2013-10-26 DIAGNOSIS — R269 Unspecified abnormalities of gait and mobility: Secondary | ICD-10-CM | POA: Diagnosis not present

## 2013-10-26 DIAGNOSIS — Z5189 Encounter for other specified aftercare: Secondary | ICD-10-CM | POA: Diagnosis not present

## 2013-10-26 DIAGNOSIS — M1990 Unspecified osteoarthritis, unspecified site: Secondary | ICD-10-CM | POA: Diagnosis not present

## 2013-10-26 DIAGNOSIS — M1710 Unilateral primary osteoarthritis, unspecified knee: Secondary | ICD-10-CM | POA: Diagnosis not present

## 2013-10-26 DIAGNOSIS — Z471 Aftercare following joint replacement surgery: Secondary | ICD-10-CM | POA: Diagnosis not present

## 2013-10-26 DIAGNOSIS — G2581 Restless legs syndrome: Secondary | ICD-10-CM | POA: Diagnosis not present

## 2013-10-26 DIAGNOSIS — M25569 Pain in unspecified knee: Secondary | ICD-10-CM | POA: Diagnosis not present

## 2013-10-26 DIAGNOSIS — G43909 Migraine, unspecified, not intractable, without status migrainosus: Secondary | ICD-10-CM | POA: Diagnosis present

## 2013-10-26 DIAGNOSIS — IMO0002 Reserved for concepts with insufficient information to code with codable children: Secondary | ICD-10-CM | POA: Diagnosis not present

## 2013-10-26 DIAGNOSIS — M6281 Muscle weakness (generalized): Secondary | ICD-10-CM | POA: Diagnosis not present

## 2013-10-26 DIAGNOSIS — Z87442 Personal history of urinary calculi: Secondary | ICD-10-CM | POA: Diagnosis not present

## 2013-10-26 DIAGNOSIS — Z96659 Presence of unspecified artificial knee joint: Secondary | ICD-10-CM | POA: Diagnosis not present

## 2013-10-26 DIAGNOSIS — M069 Rheumatoid arthritis, unspecified: Secondary | ICD-10-CM | POA: Diagnosis present

## 2013-10-26 DIAGNOSIS — D62 Acute posthemorrhagic anemia: Secondary | ICD-10-CM | POA: Diagnosis not present

## 2013-10-26 DIAGNOSIS — I10 Essential (primary) hypertension: Secondary | ICD-10-CM | POA: Diagnosis present

## 2013-10-26 DIAGNOSIS — J45909 Unspecified asthma, uncomplicated: Secondary | ICD-10-CM | POA: Diagnosis not present

## 2013-10-27 LAB — BASIC METABOLIC PANEL
Anion Gap: 8 (ref 7–16)
BUN: 10 mg/dL (ref 7–18)
Calcium, Total: 7.7 mg/dL — ABNORMAL LOW (ref 8.5–10.1)
Chloride: 101 mmol/L (ref 98–107)
Co2: 26 mmol/L (ref 21–32)
Creatinine: 0.75 mg/dL (ref 0.60–1.30)
EGFR (African American): >60
EGFR (Non-African Amer.): >60
Glucose: 126 mg/dL — ABNORMAL HIGH (ref 65–99)
Osmolality: 271 (ref 275–301)
Potassium: 4 mmol/L (ref 3.5–5.1)
Sodium: 135 mmol/L — ABNORMAL LOW (ref 136–145)

## 2013-10-27 LAB — PLATELET COUNT: Platelet: 228 10*3/uL (ref 150–440)

## 2013-10-27 LAB — HEMOGLOBIN: HGB: 9.9 g/dL — ABNORMAL LOW (ref 12.0–16.0)

## 2013-10-28 LAB — BASIC METABOLIC PANEL
Anion Gap: 5 — ABNORMAL LOW (ref 7–16)
BUN: 8 mg/dL (ref 7–18)
Calcium, Total: 8.5 mg/dL (ref 8.5–10.1)
Chloride: 99 mmol/L (ref 98–107)
Co2: 29 mmol/L (ref 21–32)
Creatinine: 0.67 mg/dL (ref 0.60–1.30)
EGFR (African American): >60
EGFR (Non-African Amer.): >60
Glucose: 127 mg/dL — ABNORMAL HIGH (ref 65–99)
Osmolality: 266 (ref 275–301)
Potassium: 3.8 mmol/L (ref 3.5–5.1)
Sodium: 133 mmol/L — ABNORMAL LOW (ref 136–145)

## 2013-10-28 LAB — CBC
HCT: 30.2 % — ABNORMAL LOW (ref 35.0–47.0)
HGB: 10.2 g/dL — ABNORMAL LOW (ref 12.0–16.0)
MCH: 29.3 pg (ref 26.0–34.0)
MCHC: 33.7 g/dL (ref 32.0–36.0)
MCV: 87 fL (ref 80–100)
Platelet: 233 10*3/uL (ref 150–440)
RBC: 3.48 10*6/uL — ABNORMAL LOW (ref 3.80–5.20)
RDW: 14.4 % (ref 11.5–14.5)
WBC: 10.6 10*3/uL (ref 3.6–11.0)

## 2013-10-30 DIAGNOSIS — M6281 Muscle weakness (generalized): Secondary | ICD-10-CM | POA: Diagnosis not present

## 2013-10-30 DIAGNOSIS — I10 Essential (primary) hypertension: Secondary | ICD-10-CM | POA: Diagnosis not present

## 2013-10-30 DIAGNOSIS — L0390 Cellulitis, unspecified: Secondary | ICD-10-CM | POA: Diagnosis not present

## 2013-10-30 DIAGNOSIS — E119 Type 2 diabetes mellitus without complications: Secondary | ICD-10-CM | POA: Diagnosis not present

## 2013-10-30 DIAGNOSIS — Z96659 Presence of unspecified artificial knee joint: Secondary | ICD-10-CM | POA: Diagnosis not present

## 2013-10-30 DIAGNOSIS — Z471 Aftercare following joint replacement surgery: Secondary | ICD-10-CM | POA: Diagnosis not present

## 2013-10-30 DIAGNOSIS — G5790 Unspecified mononeuropathy of unspecified lower limb: Secondary | ICD-10-CM | POA: Diagnosis not present

## 2013-10-30 DIAGNOSIS — J45909 Unspecified asthma, uncomplicated: Secondary | ICD-10-CM | POA: Diagnosis not present

## 2013-10-30 DIAGNOSIS — L0291 Cutaneous abscess, unspecified: Secondary | ICD-10-CM | POA: Diagnosis not present

## 2013-10-30 DIAGNOSIS — M1990 Unspecified osteoarthritis, unspecified site: Secondary | ICD-10-CM | POA: Diagnosis not present

## 2013-10-30 DIAGNOSIS — Z5189 Encounter for other specified aftercare: Secondary | ICD-10-CM | POA: Diagnosis not present

## 2013-10-30 DIAGNOSIS — G2581 Restless legs syndrome: Secondary | ICD-10-CM | POA: Diagnosis not present

## 2013-10-30 DIAGNOSIS — R269 Unspecified abnormalities of gait and mobility: Secondary | ICD-10-CM | POA: Diagnosis not present

## 2013-10-30 DIAGNOSIS — M25569 Pain in unspecified knee: Secondary | ICD-10-CM | POA: Diagnosis not present

## 2013-10-30 LAB — PATHOLOGY REPORT

## 2013-10-31 ENCOUNTER — Encounter: Payer: Self-pay | Admitting: Internal Medicine

## 2013-10-31 DIAGNOSIS — L0291 Cutaneous abscess, unspecified: Secondary | ICD-10-CM | POA: Diagnosis not present

## 2013-10-31 DIAGNOSIS — L0390 Cellulitis, unspecified: Secondary | ICD-10-CM | POA: Diagnosis not present

## 2013-11-10 DIAGNOSIS — IMO0001 Reserved for inherently not codable concepts without codable children: Secondary | ICD-10-CM | POA: Diagnosis not present

## 2013-11-10 DIAGNOSIS — Z96659 Presence of unspecified artificial knee joint: Secondary | ICD-10-CM | POA: Diagnosis not present

## 2013-11-15 DIAGNOSIS — Z96659 Presence of unspecified artificial knee joint: Secondary | ICD-10-CM | POA: Diagnosis not present

## 2013-11-17 DIAGNOSIS — Z96659 Presence of unspecified artificial knee joint: Secondary | ICD-10-CM | POA: Diagnosis not present

## 2013-11-20 DIAGNOSIS — Z96659 Presence of unspecified artificial knee joint: Secondary | ICD-10-CM | POA: Diagnosis not present

## 2013-11-22 DIAGNOSIS — Z96659 Presence of unspecified artificial knee joint: Secondary | ICD-10-CM | POA: Diagnosis not present

## 2013-11-30 DIAGNOSIS — Z96659 Presence of unspecified artificial knee joint: Secondary | ICD-10-CM | POA: Diagnosis not present

## 2013-12-04 DIAGNOSIS — Z96659 Presence of unspecified artificial knee joint: Secondary | ICD-10-CM | POA: Diagnosis not present

## 2013-12-05 ENCOUNTER — Emergency Department: Payer: Self-pay | Admitting: Emergency Medicine

## 2013-12-05 DIAGNOSIS — R112 Nausea with vomiting, unspecified: Secondary | ICD-10-CM | POA: Diagnosis not present

## 2013-12-05 DIAGNOSIS — R221 Localized swelling, mass and lump, neck: Secondary | ICD-10-CM | POA: Diagnosis not present

## 2013-12-05 DIAGNOSIS — Z79899 Other long term (current) drug therapy: Secondary | ICD-10-CM | POA: Diagnosis not present

## 2013-12-05 DIAGNOSIS — L03211 Cellulitis of face: Secondary | ICD-10-CM | POA: Diagnosis not present

## 2013-12-05 DIAGNOSIS — L0201 Cutaneous abscess of face: Secondary | ICD-10-CM | POA: Diagnosis not present

## 2013-12-05 DIAGNOSIS — I10 Essential (primary) hypertension: Secondary | ICD-10-CM | POA: Diagnosis not present

## 2013-12-05 DIAGNOSIS — R220 Localized swelling, mass and lump, head: Secondary | ICD-10-CM | POA: Diagnosis not present

## 2013-12-07 ENCOUNTER — Emergency Department: Payer: Self-pay | Admitting: Emergency Medicine

## 2013-12-07 DIAGNOSIS — L03211 Cellulitis of face: Secondary | ICD-10-CM | POA: Diagnosis not present

## 2013-12-07 DIAGNOSIS — I10 Essential (primary) hypertension: Secondary | ICD-10-CM | POA: Diagnosis not present

## 2013-12-07 DIAGNOSIS — L0201 Cutaneous abscess of face: Secondary | ICD-10-CM | POA: Diagnosis not present

## 2013-12-07 DIAGNOSIS — Z4800 Encounter for change or removal of nonsurgical wound dressing: Secondary | ICD-10-CM | POA: Diagnosis not present

## 2013-12-07 DIAGNOSIS — Z4801 Encounter for change or removal of surgical wound dressing: Secondary | ICD-10-CM | POA: Diagnosis not present

## 2013-12-11 DIAGNOSIS — Z96659 Presence of unspecified artificial knee joint: Secondary | ICD-10-CM | POA: Diagnosis not present

## 2013-12-13 DIAGNOSIS — Z96659 Presence of unspecified artificial knee joint: Secondary | ICD-10-CM | POA: Diagnosis not present

## 2013-12-18 DIAGNOSIS — Z96659 Presence of unspecified artificial knee joint: Secondary | ICD-10-CM | POA: Diagnosis not present

## 2013-12-20 DIAGNOSIS — Z96659 Presence of unspecified artificial knee joint: Secondary | ICD-10-CM | POA: Diagnosis not present

## 2014-01-03 DIAGNOSIS — M25562 Pain in left knee: Secondary | ICD-10-CM | POA: Diagnosis not present

## 2014-01-04 DIAGNOSIS — R0602 Shortness of breath: Secondary | ICD-10-CM | POA: Diagnosis not present

## 2014-01-04 DIAGNOSIS — I10 Essential (primary) hypertension: Secondary | ICD-10-CM | POA: Diagnosis not present

## 2014-01-04 DIAGNOSIS — J449 Chronic obstructive pulmonary disease, unspecified: Secondary | ICD-10-CM | POA: Diagnosis not present

## 2014-01-04 DIAGNOSIS — M791 Myalgia: Secondary | ICD-10-CM | POA: Diagnosis not present

## 2014-01-04 DIAGNOSIS — M25562 Pain in left knee: Secondary | ICD-10-CM | POA: Diagnosis not present

## 2014-01-04 DIAGNOSIS — F411 Generalized anxiety disorder: Secondary | ICD-10-CM | POA: Diagnosis not present

## 2014-01-04 DIAGNOSIS — M1991 Primary osteoarthritis, unspecified site: Secondary | ICD-10-CM | POA: Diagnosis not present

## 2014-01-04 DIAGNOSIS — N959 Unspecified menopausal and perimenopausal disorder: Secondary | ICD-10-CM | POA: Diagnosis not present

## 2014-01-16 DIAGNOSIS — M2550 Pain in unspecified joint: Secondary | ICD-10-CM | POA: Diagnosis not present

## 2014-01-30 DIAGNOSIS — F411 Generalized anxiety disorder: Secondary | ICD-10-CM | POA: Diagnosis not present

## 2014-01-30 DIAGNOSIS — I10 Essential (primary) hypertension: Secondary | ICD-10-CM | POA: Diagnosis not present

## 2014-01-30 DIAGNOSIS — J301 Allergic rhinitis due to pollen: Secondary | ICD-10-CM | POA: Diagnosis not present

## 2014-01-30 DIAGNOSIS — J449 Chronic obstructive pulmonary disease, unspecified: Secondary | ICD-10-CM | POA: Diagnosis not present

## 2014-01-30 DIAGNOSIS — J069 Acute upper respiratory infection, unspecified: Secondary | ICD-10-CM | POA: Diagnosis not present

## 2014-01-30 DIAGNOSIS — R0602 Shortness of breath: Secondary | ICD-10-CM | POA: Diagnosis not present

## 2014-02-06 ENCOUNTER — Emergency Department: Payer: Self-pay | Admitting: Emergency Medicine

## 2014-02-06 DIAGNOSIS — R112 Nausea with vomiting, unspecified: Secondary | ICD-10-CM | POA: Diagnosis not present

## 2014-02-06 DIAGNOSIS — N201 Calculus of ureter: Secondary | ICD-10-CM | POA: Diagnosis not present

## 2014-02-06 DIAGNOSIS — R197 Diarrhea, unspecified: Secondary | ICD-10-CM | POA: Diagnosis not present

## 2014-02-06 DIAGNOSIS — N202 Calculus of kidney with calculus of ureter: Secondary | ICD-10-CM | POA: Diagnosis not present

## 2014-02-06 DIAGNOSIS — I10 Essential (primary) hypertension: Secondary | ICD-10-CM | POA: Diagnosis not present

## 2014-02-06 DIAGNOSIS — R109 Unspecified abdominal pain: Secondary | ICD-10-CM | POA: Diagnosis not present

## 2014-02-06 LAB — COMPREHENSIVE METABOLIC PANEL
Albumin: 3.1 g/dL — ABNORMAL LOW (ref 3.4–5.0)
Alkaline Phosphatase: 129 U/L — ABNORMAL HIGH
Anion Gap: 9 (ref 7–16)
BUN: 13 mg/dL (ref 7–18)
Bilirubin,Total: 0.1 mg/dL — ABNORMAL LOW (ref 0.2–1.0)
Calcium, Total: 7.9 mg/dL — ABNORMAL LOW (ref 8.5–10.1)
Chloride: 105 mmol/L (ref 98–107)
Co2: 26 mmol/L (ref 21–32)
Creatinine: 0.66 mg/dL (ref 0.60–1.30)
EGFR (African American): >60
EGFR (Non-African Amer.): >60
Glucose: 112 mg/dL — ABNORMAL HIGH (ref 65–99)
Osmolality: 280 (ref 275–301)
Potassium: 4 mmol/L (ref 3.5–5.1)
SGOT(AST): 29 U/L (ref 15–37)
SGPT (ALT): 36 U/L
Sodium: 140 mmol/L (ref 136–145)
Total Protein: 6.8 g/dL (ref 6.4–8.2)

## 2014-02-06 LAB — CBC
HCT: 36.7 % (ref 35.0–47.0)
HGB: 11.5 g/dL — ABNORMAL LOW (ref 12.0–16.0)
MCH: 26.5 pg (ref 26.0–34.0)
MCHC: 31.3 g/dL — ABNORMAL LOW (ref 32.0–36.0)
MCV: 85 fL (ref 80–100)
Platelet: 312 10*3/uL (ref 150–440)
RBC: 4.34 10*6/uL (ref 3.80–5.20)
RDW: 15.9 % — ABNORMAL HIGH (ref 11.5–14.5)
WBC: 15.1 10*3/uL — ABNORMAL HIGH (ref 3.6–11.0)

## 2014-02-07 LAB — URINALYSIS, COMPLETE
Bacteria: NONE SEEN
Bilirubin,UR: NEGATIVE
Glucose,UR: NEGATIVE mg/dL (ref 0–75)
Ketone: NEGATIVE
Leukocyte Esterase: NEGATIVE
Nitrite: NEGATIVE
Ph: 5 (ref 4.5–8.0)
Protein: NEGATIVE
RBC,UR: 196 /HPF (ref 0–5)
Specific Gravity: 1.018 (ref 1.003–1.030)
Squamous Epithelial: 5
WBC UR: 3 /HPF (ref 0–5)

## 2014-02-13 DIAGNOSIS — F329 Major depressive disorder, single episode, unspecified: Secondary | ICD-10-CM | POA: Diagnosis not present

## 2014-02-13 DIAGNOSIS — N3941 Urge incontinence: Secondary | ICD-10-CM | POA: Diagnosis not present

## 2014-02-13 DIAGNOSIS — Z87442 Personal history of urinary calculi: Secondary | ICD-10-CM | POA: Diagnosis not present

## 2014-02-13 DIAGNOSIS — Z09 Encounter for follow-up examination after completed treatment for conditions other than malignant neoplasm: Secondary | ICD-10-CM | POA: Diagnosis not present

## 2014-02-13 DIAGNOSIS — K449 Diaphragmatic hernia without obstruction or gangrene: Secondary | ICD-10-CM | POA: Diagnosis not present

## 2014-02-13 DIAGNOSIS — I10 Essential (primary) hypertension: Secondary | ICD-10-CM | POA: Diagnosis not present

## 2014-02-13 DIAGNOSIS — D131 Benign neoplasm of stomach: Secondary | ICD-10-CM | POA: Diagnosis not present

## 2014-02-13 DIAGNOSIS — Z947 Corneal transplant status: Secondary | ICD-10-CM | POA: Diagnosis not present

## 2014-02-13 DIAGNOSIS — Z886 Allergy status to analgesic agent status: Secondary | ICD-10-CM | POA: Diagnosis not present

## 2014-02-13 DIAGNOSIS — K2270 Barrett's esophagus without dysplasia: Secondary | ICD-10-CM | POA: Diagnosis not present

## 2014-02-13 DIAGNOSIS — K219 Gastro-esophageal reflux disease without esophagitis: Secondary | ICD-10-CM | POA: Diagnosis not present

## 2014-02-13 DIAGNOSIS — Z8711 Personal history of peptic ulcer disease: Secondary | ICD-10-CM | POA: Diagnosis not present

## 2014-02-13 DIAGNOSIS — D130 Benign neoplasm of esophagus: Secondary | ICD-10-CM | POA: Diagnosis not present

## 2014-02-19 DIAGNOSIS — Z96652 Presence of left artificial knee joint: Secondary | ICD-10-CM | POA: Diagnosis not present

## 2014-02-19 DIAGNOSIS — M25562 Pain in left knee: Secondary | ICD-10-CM | POA: Diagnosis not present

## 2014-03-01 ENCOUNTER — Ambulatory Visit: Payer: Self-pay | Admitting: Orthopedic Surgery

## 2014-03-01 DIAGNOSIS — F329 Major depressive disorder, single episode, unspecified: Secondary | ICD-10-CM | POA: Diagnosis not present

## 2014-03-01 DIAGNOSIS — G2581 Restless legs syndrome: Secondary | ICD-10-CM | POA: Diagnosis not present

## 2014-03-01 DIAGNOSIS — M1990 Unspecified osteoarthritis, unspecified site: Secondary | ICD-10-CM | POA: Diagnosis not present

## 2014-03-01 DIAGNOSIS — Z886 Allergy status to analgesic agent status: Secondary | ICD-10-CM | POA: Diagnosis not present

## 2014-03-01 DIAGNOSIS — I499 Cardiac arrhythmia, unspecified: Secondary | ICD-10-CM | POA: Diagnosis not present

## 2014-03-01 DIAGNOSIS — K219 Gastro-esophageal reflux disease without esophagitis: Secondary | ICD-10-CM | POA: Diagnosis not present

## 2014-03-01 DIAGNOSIS — I10 Essential (primary) hypertension: Secondary | ICD-10-CM | POA: Diagnosis not present

## 2014-03-01 DIAGNOSIS — M810 Age-related osteoporosis without current pathological fracture: Secondary | ICD-10-CM | POA: Diagnosis not present

## 2014-03-01 DIAGNOSIS — M6580 Other synovitis and tenosynovitis, unspecified site: Secondary | ICD-10-CM | POA: Diagnosis not present

## 2014-03-01 DIAGNOSIS — M65862 Other synovitis and tenosynovitis, left lower leg: Secondary | ICD-10-CM | POA: Diagnosis not present

## 2014-03-01 DIAGNOSIS — F419 Anxiety disorder, unspecified: Secondary | ICD-10-CM | POA: Diagnosis not present

## 2014-03-01 DIAGNOSIS — Z8711 Personal history of peptic ulcer disease: Secondary | ICD-10-CM | POA: Diagnosis not present

## 2014-03-01 DIAGNOSIS — Z888 Allergy status to other drugs, medicaments and biological substances status: Secondary | ICD-10-CM | POA: Diagnosis not present

## 2014-03-01 DIAGNOSIS — Z881 Allergy status to other antibiotic agents status: Secondary | ICD-10-CM | POA: Diagnosis not present

## 2014-03-01 DIAGNOSIS — K2270 Barrett's esophagus without dysplasia: Secondary | ICD-10-CM | POA: Diagnosis not present

## 2014-03-01 DIAGNOSIS — Z79899 Other long term (current) drug therapy: Secondary | ICD-10-CM | POA: Diagnosis not present

## 2014-03-01 DIAGNOSIS — J449 Chronic obstructive pulmonary disease, unspecified: Secondary | ICD-10-CM | POA: Diagnosis not present

## 2014-03-01 DIAGNOSIS — Z9102 Food additives allergy status: Secondary | ICD-10-CM | POA: Diagnosis not present

## 2014-03-19 DIAGNOSIS — J069 Acute upper respiratory infection, unspecified: Secondary | ICD-10-CM | POA: Diagnosis not present

## 2014-03-19 DIAGNOSIS — I10 Essential (primary) hypertension: Secondary | ICD-10-CM | POA: Diagnosis not present

## 2014-03-19 DIAGNOSIS — F411 Generalized anxiety disorder: Secondary | ICD-10-CM | POA: Diagnosis not present

## 2014-03-19 DIAGNOSIS — R0602 Shortness of breath: Secondary | ICD-10-CM | POA: Diagnosis not present

## 2014-03-19 DIAGNOSIS — J060 Acute laryngopharyngitis: Secondary | ICD-10-CM | POA: Diagnosis not present

## 2014-03-19 DIAGNOSIS — J449 Chronic obstructive pulmonary disease, unspecified: Secondary | ICD-10-CM | POA: Diagnosis not present

## 2014-04-20 DIAGNOSIS — E559 Vitamin D deficiency, unspecified: Secondary | ICD-10-CM | POA: Diagnosis not present

## 2014-04-20 DIAGNOSIS — Z0001 Encounter for general adult medical examination with abnormal findings: Secondary | ICD-10-CM | POA: Diagnosis not present

## 2014-04-20 DIAGNOSIS — R7301 Impaired fasting glucose: Secondary | ICD-10-CM | POA: Diagnosis not present

## 2014-04-20 DIAGNOSIS — E782 Mixed hyperlipidemia: Secondary | ICD-10-CM | POA: Diagnosis not present

## 2014-04-20 DIAGNOSIS — I10 Essential (primary) hypertension: Secondary | ICD-10-CM | POA: Diagnosis not present

## 2014-04-26 DIAGNOSIS — J449 Chronic obstructive pulmonary disease, unspecified: Secondary | ICD-10-CM | POA: Diagnosis not present

## 2014-04-26 DIAGNOSIS — F411 Generalized anxiety disorder: Secondary | ICD-10-CM | POA: Diagnosis not present

## 2014-04-26 DIAGNOSIS — E559 Vitamin D deficiency, unspecified: Secondary | ICD-10-CM | POA: Diagnosis not present

## 2014-04-26 DIAGNOSIS — I10 Essential (primary) hypertension: Secondary | ICD-10-CM | POA: Diagnosis not present

## 2014-04-26 DIAGNOSIS — G43909 Migraine, unspecified, not intractable, without status migrainosus: Secondary | ICD-10-CM | POA: Diagnosis not present

## 2014-05-10 DIAGNOSIS — D511 Vitamin B12 deficiency anemia due to selective vitamin B12 malabsorption with proteinuria: Secondary | ICD-10-CM | POA: Diagnosis not present

## 2014-05-11 ENCOUNTER — Emergency Department: Payer: Self-pay | Admitting: Emergency Medicine

## 2014-05-11 DIAGNOSIS — Z7951 Long term (current) use of inhaled steroids: Secondary | ICD-10-CM | POA: Diagnosis not present

## 2014-05-11 DIAGNOSIS — Z79899 Other long term (current) drug therapy: Secondary | ICD-10-CM | POA: Diagnosis not present

## 2014-05-11 DIAGNOSIS — T7840XA Allergy, unspecified, initial encounter: Secondary | ICD-10-CM | POA: Diagnosis not present

## 2014-05-15 ENCOUNTER — Observation Stay: Payer: Self-pay | Admitting: Internal Medicine

## 2014-05-15 DIAGNOSIS — N201 Calculus of ureter: Secondary | ICD-10-CM | POA: Diagnosis not present

## 2014-05-15 DIAGNOSIS — N132 Hydronephrosis with renal and ureteral calculous obstruction: Secondary | ICD-10-CM | POA: Diagnosis not present

## 2014-05-15 DIAGNOSIS — G2581 Restless legs syndrome: Secondary | ICD-10-CM | POA: Diagnosis not present

## 2014-05-15 DIAGNOSIS — I10 Essential (primary) hypertension: Secondary | ICD-10-CM | POA: Diagnosis not present

## 2014-05-15 DIAGNOSIS — M549 Dorsalgia, unspecified: Secondary | ICD-10-CM | POA: Diagnosis not present

## 2014-05-15 DIAGNOSIS — N202 Calculus of kidney with calculus of ureter: Secondary | ICD-10-CM | POA: Diagnosis not present

## 2014-05-15 DIAGNOSIS — N200 Calculus of kidney: Secondary | ICD-10-CM | POA: Diagnosis not present

## 2014-05-15 DIAGNOSIS — N1330 Unspecified hydronephrosis: Secondary | ICD-10-CM | POA: Diagnosis not present

## 2014-05-15 DIAGNOSIS — R109 Unspecified abdominal pain: Secondary | ICD-10-CM | POA: Diagnosis not present

## 2014-05-15 DIAGNOSIS — N134 Hydroureter: Secondary | ICD-10-CM | POA: Diagnosis not present

## 2014-05-15 DIAGNOSIS — Z87442 Personal history of urinary calculi: Secondary | ICD-10-CM | POA: Diagnosis not present

## 2014-05-15 DIAGNOSIS — F329 Major depressive disorder, single episode, unspecified: Secondary | ICD-10-CM | POA: Diagnosis not present

## 2014-05-16 DIAGNOSIS — N200 Calculus of kidney: Secondary | ICD-10-CM | POA: Diagnosis not present

## 2014-05-16 DIAGNOSIS — N132 Hydronephrosis with renal and ureteral calculous obstruction: Secondary | ICD-10-CM | POA: Diagnosis not present

## 2014-05-16 DIAGNOSIS — N201 Calculus of ureter: Secondary | ICD-10-CM | POA: Diagnosis not present

## 2014-05-16 DIAGNOSIS — R109 Unspecified abdominal pain: Secondary | ICD-10-CM | POA: Diagnosis not present

## 2014-05-17 DIAGNOSIS — G2581 Restless legs syndrome: Secondary | ICD-10-CM | POA: Diagnosis not present

## 2014-05-17 DIAGNOSIS — N132 Hydronephrosis with renal and ureteral calculous obstruction: Secondary | ICD-10-CM | POA: Diagnosis not present

## 2014-05-17 DIAGNOSIS — N201 Calculus of ureter: Secondary | ICD-10-CM | POA: Diagnosis not present

## 2014-05-19 ENCOUNTER — Emergency Department: Payer: Self-pay | Admitting: Emergency Medicine

## 2014-05-19 DIAGNOSIS — R310 Gross hematuria: Secondary | ICD-10-CM | POA: Diagnosis not present

## 2014-05-19 DIAGNOSIS — N201 Calculus of ureter: Secondary | ICD-10-CM | POA: Diagnosis not present

## 2014-05-19 DIAGNOSIS — R109 Unspecified abdominal pain: Secondary | ICD-10-CM | POA: Diagnosis not present

## 2014-05-29 DIAGNOSIS — N200 Calculus of kidney: Secondary | ICD-10-CM | POA: Diagnosis not present

## 2014-06-08 DIAGNOSIS — F411 Generalized anxiety disorder: Secondary | ICD-10-CM | POA: Diagnosis not present

## 2014-06-08 DIAGNOSIS — D511 Vitamin B12 deficiency anemia due to selective vitamin B12 malabsorption with proteinuria: Secondary | ICD-10-CM | POA: Diagnosis not present

## 2014-06-08 DIAGNOSIS — J449 Chronic obstructive pulmonary disease, unspecified: Secondary | ICD-10-CM | POA: Diagnosis not present

## 2014-06-08 DIAGNOSIS — I10 Essential (primary) hypertension: Secondary | ICD-10-CM | POA: Diagnosis not present

## 2014-06-27 ENCOUNTER — Ambulatory Visit: Admit: 2014-06-27 | Disposition: A | Discharge status: Home / Self Care | Payer: Self-pay | Admitting: Urology

## 2014-06-27 DIAGNOSIS — Z09 Encounter for follow-up examination after completed treatment for conditions other than malignant neoplasm: Secondary | ICD-10-CM | POA: Diagnosis not present

## 2014-06-27 DIAGNOSIS — K760 Fatty (change of) liver, not elsewhere classified: Secondary | ICD-10-CM | POA: Diagnosis not present

## 2014-06-27 DIAGNOSIS — N200 Calculus of kidney: Secondary | ICD-10-CM | POA: Diagnosis not present

## 2014-06-28 ENCOUNTER — Emergency Department
Admit: 2014-06-28 | Disposition: A | Discharge status: Home / Self Care | Payer: Self-pay | Admitting: Emergency Medicine

## 2014-06-28 ENCOUNTER — Ambulatory Visit
Admit: 2014-06-28 | Disposition: A | Discharge status: Home / Self Care | Payer: Self-pay | Attending: Obstetrics and Gynecology | Admitting: Obstetrics and Gynecology

## 2014-06-28 DIAGNOSIS — N200 Calculus of kidney: Secondary | ICD-10-CM | POA: Diagnosis not present

## 2014-06-28 DIAGNOSIS — R531 Weakness: Secondary | ICD-10-CM | POA: Diagnosis not present

## 2014-06-28 DIAGNOSIS — R231 Pallor: Secondary | ICD-10-CM | POA: Diagnosis not present

## 2014-06-28 DIAGNOSIS — R109 Unspecified abdominal pain: Secondary | ICD-10-CM | POA: Diagnosis not present

## 2014-06-28 DIAGNOSIS — M6281 Muscle weakness (generalized): Secondary | ICD-10-CM | POA: Diagnosis not present

## 2014-06-28 LAB — COMPREHENSIVE METABOLIC PANEL
Albumin: 3.8 g/dL
Alkaline Phosphatase: 92 U/L
Anion Gap: 9 (ref 7–16)
BUN: 13 mg/dL
Bilirubin,Total: 0.2 mg/dL — ABNORMAL LOW
Calcium, Total: 8.4 mg/dL — ABNORMAL LOW
Chloride: 102 mmol/L
Co2: 25 mmol/L
Creatinine: 0.5 mg/dL
EGFR (African American): >60
EGFR (Non-African Amer.): >60
Glucose: 82 mg/dL
Potassium: 3.5 mmol/L
SGOT(AST): 19 U/L
SGPT (ALT): 14 U/L
Sodium: 136 mmol/L
Total Protein: 6.5 g/dL

## 2014-06-28 LAB — URINALYSIS, COMPLETE
Bacteria: NONE SEEN
Bilirubin,UR: NEGATIVE
Blood: NEGATIVE
Glucose,UR: NEGATIVE mg/dL (ref 0–75)
Ketone: NEGATIVE
Leukocyte Esterase: NEGATIVE
Nitrite: NEGATIVE
Ph: 5 (ref 4.5–8.0)
Protein: NEGATIVE
RBC,UR: NONE SEEN /HPF (ref 0–5)
Specific Gravity: 1.011 (ref 1.003–1.030)

## 2014-06-28 LAB — CBC
HCT: 34.4 % — ABNORMAL LOW (ref 35.0–47.0)
HGB: 11.3 g/dL — ABNORMAL LOW (ref 12.0–16.0)
MCH: 27.6 pg (ref 26.0–34.0)
MCHC: 32.9 g/dL (ref 32.0–36.0)
MCV: 84 fL (ref 80–100)
Platelet: 284 10*3/uL (ref 150–440)
RBC: 4.09 10*6/uL (ref 3.80–5.20)
RDW: 16.7 % — ABNORMAL HIGH (ref 11.5–14.5)
WBC: 9.9 10*3/uL (ref 3.6–11.0)

## 2014-06-28 LAB — TROPONIN I: Troponin-I: <0.03 ng/mL

## 2014-06-29 DIAGNOSIS — N200 Calculus of kidney: Secondary | ICD-10-CM | POA: Diagnosis not present

## 2014-07-05 DIAGNOSIS — N200 Calculus of kidney: Secondary | ICD-10-CM | POA: Diagnosis not present

## 2014-07-10 NOTE — Consult Note (Signed)
PATIENT NAME:  Summer Murphy, Summer Murphy MR#:  [id number] DATE OF BIRTH:  1955-11-17  DATE OF CONSULTATION:  11/08/2011  REFERRING PHYSICIAN:   CONSULTING PHYSICIAN:  Manya Silvas, MD  HISTORY OF PRESENT ILLNESS: Patient is a 59 year old white female who had right knee replacement surgery on 10/29/2011, was discharged on 11/02/2011. She was found to be anemic at [facility] and was sent back to the hospital. I was asked to see her in consultation.   Patient has a history of ulcers in the stomach in the past. She has a history of Barrett's esophagus, previous fundoplication x2. She last had a colonoscopy about 10 years ago. Her last upper endoscopy was within six months at [facility] with Dr. Lorelee Market with ablation of Barrett's esophagus. She was told at that time she did not need to come back for a year.   PAST SURGICAL HISTORY:  1. Right knee replacement.  2. Kidney stones.  3. Cholecystectomy.  4. Appendectomy.  5. Hysterectomy with removal of one ovary.  6. Bilateral cornea transplants.   PAST MEDICAL HISTORY:  1. History of ulcer disease. 2. History of kidney stones.  3. History of anxiety and depression. 4. History of asthma. 5. History of migraine headaches.   MEDICATIONS ON ADMISSION:  1. Ambien 5 mg at bedtime.  2. Amitriptyline 25 mg daily.  3. Benadryl 25 mg q.4 p.r.n.  4. Cardizem CD 180, 1 a day.  5. Claritin 10 mg a day.  6. Combivent 1 puff 4 times a day.  7. Cymbalta 30 mg a day.  8. Dulcolax 10 mg rectal daily as needed. 9. DuoNebs 3 mL q.6 p.r.n.  10. Iron sulfate 325 mg a day.  11. Milk of magnesia 30 mL b.i.d.  12. MiraLax 17 grams once daily. 13. Mylanta 30 mL q.6 p.r.n.  14. Neurontin 4 mg twice a day.  15. Oxycodone 5 mg 1 to 2 tablets q.4 hours as needed  16. OxyContin 20 mg q.12 hours.  17. Premarin 0.45 mg daily.  18. Promethazine 25 mg q.6 hours p.r.n.  19. Protonix 40 mg a day.  20. Requip 0.25 mg 3 tablets daily as needed.  21. Senokot S 1 tablet  twice a day.  22. Singulair 10 mg a day.  23. Tylenol extra strength 1 or 2 q.4 p.r.n.  24. Valium 5 mg q.6 hours as needed. 25. Xarelto 10 mg daily. Patient was to use Xarelto from 08/13 to 08/21. It was not continued after she came in the hospital.    HABITS: Does not smoke, does not drink.   FAMILY HISTORY: Patient's parents are deceased. No known cancer in the family.   ALLERGIES: Reglan, Zithromax, aspirin, Librium, clindamycin and vanilla.    PHYSICAL EXAMINATION:  GENERAL: White female in no acute distress, looking pale.   VITAL SIGNS: Temperature 97.6, pulse 92, blood pressure 106/69, oxygen saturation 94%.   HEENT: Sclerae nonicteric. Conjunctivae pale. Skin is pale. Tongue is somewhat pale. The head is atraumatic. Trachea is in the midline.   CHEST: Clear. No asthma or wheezing at this time.   HEART: No murmurs or gallops I can hear.   BREAST: Exam not done.   ABDOMEN: Bowel sounds present. No hepatosplenomegaly. No masses. No bruits. No significant tenderness.   RECTAL: Mucus very heme positive. No stool was in the vault.   EXTREMITIES: She has a bandage on her right knee. No evidence of blood in the tissues.   LABORATORY, DIAGNOSTIC AND RADIOLOGICAL DATA: Glucose 99, iron  27, BUN 9, creatinine 0.56, sodium 137, potassium 4, chloride 98, CO2 30, calcium 7.8, magnesium 2.2, LDH 172, ferritin 122, hemoglobin on the 16th was 7, on the 17th it was 7.7 and this morning was 6.7. She got 1 unit of blood. She has a Duffy A antibody and it is difficult to type and cross her. Urinalysis 1+ leukocyte esterase, 8 white cells per high-powered field. Stools is positive for blood in the lab. Chest x-ray PA and lateral hazy left lower lobe airspace disease concerning for pneumonia. Recommend follow radiography. CT of the chest for pulmonary embolism innumerable irregular areas of ground glass attenuation throughout all lobes in both lungs, multifocal pneumonia is a possibility. No definite  evidence of pulmonary embolism. Moderately large hiatal hernia noted. Single x-ray view of the chest done portable suggests low-grade congestive heart failure with minimal interstitial edema.   ASSESSMENT AND RECOMMENDATIONS: Probably GI bleeding from upper GI tract, possibly lower GI tract may have been made worse by the Xarelto that was needed for anticoagulation after knee replacement. She has had a significant drop in hemoglobin yet her BUN is only 9. This suggests the possibility that the blood could be coming from her colon. Usually upper GI bleeds will cause elevated BUN. Original plan was to do an upper endoscopy tomorrow, given her pulmonary findings this may be postponed a few days. She is to be transfused another unit of blood after blood that matches her antibody can be found. In the meantime twice a day IV PPIs and clear liquids will be treatment of choice for possible upper GI bleed. She has not had a colonoscopy in 10 years, so this will be pursued later on. Will follow with you.  ____________________________ Manya Silvas, MD rte:cms D: 11/08/2011 17:19:37 ET T: 11/09/2011 05:25:59 ET JOB#: [id number] cc: Manya Silvas, MD, <Dictator> Laurene Footman, MD Manya Silvas MD ELECTRONICALLY SIGNED 11/10/2011 18:36

## 2014-07-10 NOTE — Consult Note (Signed)
Consult done.  CC:  anemia with acute fall in hgb.  Pt with heme positive stool, hx of Barretts with ablation at [facility], last EGD was  a few months ago.  No colon in 10 years. Was on Xaralto for a few days.  I will hold her ferrous sulfate for now until the situation is more clear. Difficult to transfuse due to antibody.  Will change diet to clear liquid for now.  her lung findings may be due to years of asthma, suggest pulmonary consult if you agree.  Will hold EGD for now, clear liq diet, agree with transfusion,  she was taking ibuprofen at least one a day for long time.  Electronic Signatures: Manya Silvas (MD)  (Signed on 18-Aug-13 17:25)  Authored  Last Updated: 18-Aug-13 17:25 by Manya Silvas (MD)

## 2014-07-10 NOTE — Consult Note (Signed)
Patient with ulcers and gastrittis on EGD.  Will give full liquids.  mashed potatoes and scrambled eggs.  Electronic Signatures: Manya Silvas (MD)  (Signed on 20-Aug-13 17:59)  Authored  Last Updated: 20-Aug-13 17:59 by Manya Silvas (MD)

## 2014-07-10 NOTE — Op Note (Signed)
PATIENT NAME:  Summer Murphy, Summer Murphy MR#:  [id number] DATE OF BIRTH:  [birth year]  DATE OF PROCEDURE:  10/29/2011  PREOPERATIVE DIAGNOSIS: Severe right knee osteoarthritis.   POSTOPERATIVE DIAGNOSIS: Severe right knee osteoarthritis.   PROCEDURE: Right total knee replacement.   SURGEON: Laurene Footman, MD  ANESTHESIA: Spinal.   DESCRIPTION OF PROCEDURE: The patient was brought to the Operating Room and after adequate anesthesia was obtained the right leg was prepped and draped in the usual sterile fashion with an Alvarado legholder utilized. After appropriate patient identification and time out procedures were carried out, the right leg was exsanguinated with an Esmarch and tourniquet raised to 300 mmHg. A midline skin incision was made followed by a lateral parapatellar arthrotomy, lateral approach secondary to severe valgus deformity. After exposing the anterior knee, the patella was noted to have extensive wear and was quite thin. There was eburnated bone on the femoral condyle with extensive trochlear bone loss as well as lateral compartment bone loss. The fat pad was excised and the proximal tibia exposed to apply the cutting block for the Medacta proximal tibia cutting guide. After this initial approach, the proximal tibia cut was carried out with the menisci subsequently excised along with the posterior cruciate ligament. The anterior cruciate ligament was deficient. The femur was then adequately exposed to apply the femoral cutting guide, femoral cutting block applied, and anterior and posterior and chamfer cuts carried out without notching. After completing the femoral cut, the trials were placed and the PS cut carried out on the femur because of the valgus deformity. After this was completed, the trials were placed and full extension could be obtained with a 12 mm trial. The patella was cut and minimal reaming carried out for the inlay patellar trial. This was deemed appropriate and all trial  components were removed. The knee was thoroughly irrigated and dried. The implants were then cemented into place with a 12 mm PS insert also stabilized with setscrew. The knee was stable through a range of motion and full extension and flexion obtained with good patellofemoral tracking. The tourniquet was let down at this point and the lateral arthrotomy repaired using a heavy quill suture and 2-0 quill subcutaneously, followed by skin staples, Xeroform, 4 x 4's, Webril, Polar Care, and Ace wrap. The patient was sent to the recovery room in stable condition.   IMPLANTS: GMK primary total knee system from [place], right #3 PS femoral component, fixed tibial tray size 2, 28 mm inset patella, and a GMK 12 mm PS tibial insert. All components were cemented.   ESTIMATED BLOOD LOSS: 100 mL.  TOURNIQUET TIME: 83 minutes.   COMPLICATIONS: None.   SPECIMEN: Cut ends of bone. ____________________________ Laurene Footman, MD mjm:slb D: 10/29/2011 10:44:28 ET T: 10/29/2011 11:10:45 ET JOB#: [id number]  cc: Laurene Footman, MD, <Dictator> Laurene Footman MD ELECTRONICALLY SIGNED 10/29/2011 14:40

## 2014-07-10 NOTE — Discharge Summary (Signed)
PATIENT NAME:  Summer Murphy, Summer Murphy MR#:  [id number] DATE OF BIRTH:  04-13-1955  DATE OF ADMISSION:  10/29/2011 DATE OF DISCHARGE:  11/02/2011  ADMITTING DIAGNOSIS: Severe right knee osteoarthritis.  DISCHARGE DIAGNOSES: Severe right knee osteoarthritis.   PROCEDURE: Right total knee replacement.   SURGEON: Laurene Footman, M.D.   ANESTHESIA: Spinal.   ESTIMATED BLOOD LOSS: 100 mL.  TOURNIQUET TIME: 83 minutes.   COMPLICATIONS: None.   SPECIMEN: Cut ends of bone.   HISTORY: The patient is a 59 year old with severe right knee tricompartmental osteoarthritis. Her CT was reviewed with her. She is very thin and has subluxed patella. Images were reviewed with her. Dr. Rudene Christians discussed that with extensive erosion of the patella resurfacing may be difficult and may not be able to resurface the patella because of its thinness. There are extensive spurs in all compartments. She has had unrelenting pain with failure of nonoperative measures including aspiration, injection, and bracing.   PHYSICAL EXAMINATION: Lungs are clear to auscultation. Heart regular rate and rhythm.  HEENT is normal. Knee range of motion is 15 to 92 degrees. She has valgus deformity that is not entirely passively correctable. Marked patellofemoral crepitation and patellar subluxation. Distally she is neurovascularly intact with intact dorsalis pedis and posterior tibial pulses. No clubbing, cyanosis, or edema.   HOSPITAL COURSE: The patient was admitted to the hospital on 10/29/2011. She had surgery that same day and was brought to the orthopedic floor from the PAC-U unit in stable condition. Throughout the patient's stay, her vital signs and lab work was monitored. The patient's hemoglobin dropped down from 12.1 to 9.4. On 10/31/2011 it dropped again to 9.3 and was stable. The patient's vital signs did indicate she was tachycardic as well as hypertensive and was thought to be due to pain. The patient was given OxyContin 20 mg every  12 hours which did seem to help alleviate her pain. On 10/31/2011 the patient was having a lot of swelling and tenderness into the right hamstring as well as the back of the knee. Doppler ultrasound was ordered of the right lower extremity, which was negative for any DVTs. Throughout the patient's stay, she did have physical therapy. She progressed slowly. She did recommend that she go to rehab and she was not ready to go home. On 11/02/2011 the patient was stable and ready for discharge to a rehab facility.   DISCHARGE INSTRUCTIONS: The patient may weight bear as tolerated. She needs to wear TED hose thigh high to both legs for the next six weeks, she can take off at nighttime. She needs to continue using incentive spirometry every one hour. She needs to cough and take a deep breaths every two hours. She needs to use the Polar Care and maintain a temperature of 40 to 50 degrees applied to right knee. She needs to be evaluated and treated for difficulty walking and weight-bearing. She is weight-bearing as tolerated by physical therapy. Also she needs to be evaluated and treated for OT for muscle weakness.   DISCHARGE MEDICATIONS:  1. Tylenol 500 to 1000 mg oral every four hours p.r.n. pain. 2. Oxycodone 5 to 10 mg oral every 4 hours p.r.n. pain.  3. Zolpidem 5 mg oral at bedtime p.r.n. for insomnia.  4. Bisacodyl suppository 10 mg rectally daily p.r.n. constipation. 5. Milk of Magnesia 30 mL oral twice a day p.r.n. constipation.  6. Enema soapsuds p.r.n.  7. Aluminum mag hydroxide with simethicone 400/400/40 mg/5 mL suspension, 30 mL oral every 6 hours  p.r.n. indigestion and heartburn.  8. Senokot 1 tablet oral twice a day. 9. Requip 0.25 mg oral three times daily p.r.n. headache. 10. Pantoprazole 40 mg oral every 6:00 a.m. 11. Montelukast 10 mg oral at bedtime.  12. Gabapentin 400 mg oral twice a day. 13. Diltiazem 180 mg oral daily. 14. Fluoxetine 30 mL oral daily. 15. Albuterol ipratropium oral  inhaler 1 puff inhalation four times daily as needed for wheezing. 16. Cetirizine HCL tablet 10 mg oral daily.  17. Amitriptyline tablet 25 mg oral at bedtime. 18. Albuterol ipratropium SVN 3 mL SVN every 6 hours p.r.n. wheezing. 19. Premarin 0.45 mg oral at bedtime.  20. Diphenhydramine 25 mg oral every 4 to 6 hours p.r.n. itching. 21. Diazepam 5 mg oral every 6 hours p.r.n. muscle spasms.  22. Oxycodone SR tablet 20 mg oral every 12 hours.  ____________________________ Duanne Guess, PA-C tcg:slb D: 11/02/2011 07:38:00 ET T: 11/02/2011 12:02:27 ET JOB#: [id number]  cc: Duanne Guess, PA-C, <Dictator> Duanne Guess PA ELECTRONICALLY SIGNED 11/04/2011 12:01

## 2014-07-10 NOTE — Op Note (Signed)
PATIENT NAME:  PICCOLA, ARICO MR#:  [id number] DATE OF BIRTH:  12-26-1955  DATE OF PROCEDURE:  11/06/2011  PREOPERATIVE DIAGNOSES:  1. Anemia.  2. Tachycardia.  3. Pneumonia.  4. Poor venous access.   POSTOPERATIVE DIAGNOSES:  1. Anemia.  2. Tachycardia.  3. Pneumonia.  4. Poor venous access.   PROCEDURES:  1. Ultrasound guidance for vascular access to right basilic vein.  2. Fluoroscopic guidance for placement of catheter.  3. Insertion of peripherally inserted central venous catheter, right arm.  SURGEON: Leotis Pain, MD   ANESTHESIA: Local.   ESTIMATED BLOOD LOSS: Minimal.   INDICATION FOR PROCEDURE: The patient is a 59 year old white female with the above-mentioned problems. She needs a PICC line for durable venous access for IV antibiotics, fluids and other issues.   DESCRIPTION OF PROCEDURE: The patient's right arm was sterilely prepped and draped, and a sterile surgical field was created. The right basilic vein was accessed under direct ultrasound guidance without difficulty with a micropuncture needle and permanent image was recorded. 0.018 wire was then placed into the superior vena cava. Peel-away sheath was placed over the wire. A single lumen peripherally inserted central venous catheter was then placed over the wire and the wire and peel-away sheath were removed. The catheter tip was placed into the superior vena cava and was secured at the skin at 33 cm with a sterile dressing. The catheter withdrew blood well and flushed easily with heparinized saline. The patient tolerated procedure well. ____________________________ Algernon Huxley, MD jsd:cbb D: 11/06/2011 16:09:58 ET T: 11/06/2011 16:50:09 ET JOB#: [id number]  cc: Algernon Huxley, MD, <Dictator> Algernon Huxley MD ELECTRONICALLY SIGNED 11/10/2011 8:30

## 2014-07-10 NOTE — H&P (Signed)
PATIENT NAME:  Summer Murphy, MINICHIELLO MR#:  [id number] DATE OF BIRTH:  Sep 09, 1955  DATE OF ADMISSION:  11/05/2011  PRIMARY CARE PHYSICIAN:  Dr. Clayborn Bigness    HISTORY OF PRESENT ILLNESS: The patient is a 59 year old Caucasian female with past medical history significant for history of right knee osteoarthritis who underwent total right knee replacement by Dr. Rudene Christians on 10/29/2011, presented back from [facility] where she was discharged on 11/02/2011 with complaints of not feeling well. Apparently the patient has been followed by [facility] and she was found to be anemic. Her blood count was found to be low. She denies, however, any significant bleeding except as usual which she tells me from her surgical site in the right knee. However, her right knee dressing remains intact and did not seem to be soaked with blood. She admits having some wheezing, however denies any significant shortness of breath. She tells me that she has been short of breath as she usually is.  She tells me that she can walk only short distances because she feels very tired and weak. She denies any significant dizziness or lightheadedness. However, according to the Emergency Room physician she admitted having some dizziness initially on arrival to the Emergency Room. Because of the weakness and fatigue and somewhat elevated temperature level, the patient was brought to the Emergency Room for further evaluation. In the Emergency Room her heart rate is 111, blood pressure 115/55 and oxygen saturation is 90 to 91% on room air. Hospitalist services were contacted for admission as the patient's hemoglobin was found to be low at 7.2, being 9.3 just five days ago.   PAST MEDICAL HISTORY:  1. History of right knee osteoarthritis status post total right knee replacement by Dr. Rudene Christians on 10/29/2011.  2. History of atypical migraine headaches with left-sided numbness, admission for the same in 09/2010. 3. Hospitalization in January 2012 with  allergic reaction to vanilla. 4. Asthma.  5. History of anxiety and depression. 6. Kidney stones.  7. Peptic ulcer disease.  8. History of migraines.   MEDICATIONS: 1. Ambien 5 mg p.o. at bedtime.  2. Amitriptyline 25 mg p.o. daily. 3. Benadryl 25 mg every four hours as needed. 4. Cardizem CD 180 mg p.o. daily.  5. Claritin 10 mg p.o. daily.  6. Combivent Respimat CFC free, one puff 4 times daily.  7. Cymbalta 30 mg p.o. daily.  8. Dulcolax laxative 10 mg rectally suppository once daily as needed.  9. DuoNebs 3 mL every six hours as needed.  10. Iron sulfate 325 mg p.o. once daily.  11. Milk of Magnesia 30 mL twice daily as needed.  12. MiraLAX 17 grams in 8 ounces of water once daily for constipation.  13. Mylanta 30 mL every six hours as needed.  14. Neurontin 400 mg p.o. twice daily.  15. Oxycodone 5 mg, 1 to 2 tablets every four hours as needed.  16. OxyContin 20 mg p.o. every 12 hours.  17. Premarin 0.45 mg p.o. daily.  18. Promethazine 25 mg p.o. every six hours as needed.  19. Protonix 40 mg p.o. daily.  20. Requip 0.25 mg 3 times daily as needed.  21. Senokot-S 50/8.6 mg 1 tablet twice a day.  22. Singulair 10 mg p.o. daily.  23. Tylenol extra strength 500 mg, 1 to 2 capsules every four hours as needed.  24. Valium 5 mg p.o. every six hours as needed.  25. Xarelto 10 mg p.o. daily. The patient is to use Xarelto from 08/13  to 11/11/2011 after knee replacement therapy.   PAST SURGICAL HISTORY:  1. Appendectomy.  2. Cholecystectomy.  3. Hysterectomy. 4. Nissen fundoplication. 5. Right knee osteoarthritis with total right knee replacement by Dr. Rudene Christians on 10/29/2011.   SOCIAL HISTORY: The patient lives alone. No tobacco, alcohol, or drug abuse. Now she is in [facility] for rehabilitation.   FAMILY HISTORY: Hypertension and diabetes. The patient is alone. She does not have any parents.   ALLERGIES: Reglan, Zithromax, aspirin, Librium, clindamycin, and vanilla.    REVIEW OF SYSTEMS:  Feeling feverish, temperature high at 99.5, high temperature for her, fatigued and weak, having some pains in her right knee, having some wheezing and some shortness of breath, also possibly lightheaded and dizzy, and also admits having inability to urinate, has to push out her urine. She tells me it is probably due to the Foley catheter she had in the hospital.  She denies any high fevers, weight loss or gain. EYES: In regards to eyes, denies any blurry vision, double vision, glaucoma, or cataracts. ENT: Denies any tinnitus, allergies, epistaxis, sinus pain, dentures, or difficulty swallowing. RESPIRATORY: Denies any significant cough or hemoptysis. Admits to asthma.  No painful respirations or chronic obstructive pulmonary disease.  CARDIOVASCULAR: Denies chest pains, orthopnea, edema, arrhythmias, palpitations, or syncope. GASTROINTESTINAL: Denies nausea, vomiting, diarrhea, or constipation. GENITOURINARY: Denies dysuria, hematuria, frequency, or incontinence.  ENDOCRINE: Denies any polydipsia, nocturia, thyroid problems, heat or cold intolerance, or thirst.  HEMATOLOGIC: Denies anemia, easy bruising, bleeding, or swollen glands. SKIN: Denies acne, rash, lesion, or change in moles. MUSCULOSKELETAL: Denies arthritis, cramps, or swelling. NEURO: No numbness, epilepsy, or tremor. PSYCH: No anxiety, insomnia, or depression.  PHYSICAL EXAMINATION: VITAL SIGNS: Temperature is 100.1, pulse 115, respirations 22, blood pressure 131/58, saturation 90% on room air.   GENERAL: This is well-developed, well-nourished mildly obese Caucasian female somewhat stressed out sitting on the stretcher. She is anxious and she is somewhat tremulous.   HEENT: Pupils are equal and reactive to light. Extraocular movements intact.  No icterus or conjunctivitis.  Normal hearing. No pharyngeal erythema. Mucosa is moist.  NECK:  No masses, supple, nontender. Thyroid not enlarged. No adenopathy. No JVD or  carotid bruits bilaterally. Full range of motion.   LUNGS: Diminished breath sounds posteriorly as well as wheezing were heard. Labored inspirations noted  as well as increased effort to breathe. The patient is in mild respiratory distress, but no dullness to percussion anteriorly. The patient does not have any rales, rhonchi, or wheezing.     CARDIOVASCULAR: S1, S2.  No murmurs, rubs, or gallops. PMI not lateralized. Chest is nontender to palpation. 1+ pedal pulses. No lower extremity edema, calf tenderness, or cyanosis.   ABDOMEN: Soft, nontender. Bowel sounds are present. No hepatosplenomegaly or masses were noted.   RECTAL: Deferred.   MUSCULOSKELETAL: Muscle strength- able to move all extremities except the right lower extremity because of the pain she has in the right knee. The patient does have some mild swelling around the knee going down into her calf, 1+ in the tibial area. No cyanosis.  Somewhat increased warmth was noted of the right knee itself. No kyphosis. Gait is not tested.   SKIN: Skin otherwise did not show any rashes or lesions. The patient did have mild erythema of the right knee area. No nodularity or induration. The patient's skin was warm and dry to palpation.  Swelling as noted above and increased warmth was noted of the right knee. The patient does have a longitudinal  dressing which is dry. No bleeding was noted.   LYMPH: No adenopathy in the cervical region.   NEUROLOGICAL: Cranial nerves grossly intact. Sensory is intact. No dysarthria or aphasia. The patient is alert, oriented to time, person, and place, cooperative. Memory is impaired but no significant confusion, agitation, or depression noted.   LABORATORY, DIAGNOSTIC, AND RADIOLOGICAL DATA: BMP showed a glucose of 128, sodium 131, potassium 3.2, bicarbonate elevated at 33, otherwise BMP was unremarkable. The patient's lipase level was 38, albumin level 2.2, total protein 6, otherwise liver enzymes were normal. White  blood cell count is normal at 11. Hemoglobin 7.2, as compared to 9.3 on 10/31/2011, platelet count 294. Coagulation panel: ProTime 16.1, INR 1.3.   Urinalysis: Amber-colored urine, negative for glucose, bilirubin, or ketones. Specific gravity 1.018, pH 5. Negative for blood, protein, nitrites. 1+ leukocyte esterase, 1 red blood cell, 8 white blood cells. No bacteria. 3 epithelial cells. Mucous is present.   EKG: Sinus tachy at 107 beats per minute, left bundle branch block. Nonspecific ST-T changes were noted.   Chest x-ray is unremarkable. No atelectasis, effusion, or pneumonia was seen.   ASSESSMENT AND PLAN:  1. Acute posthemorrhagic anemia, very likely due to intraoperative hemorrhage loss, can not r/o other hemorrhage but the patient denies any gastrointestinal bleed or any significant bleed from her surgical site. We will admit the patient to the medical floor. I discussed with the patient the risks and benefits of transfusion. She was agreeable to be transfused. We will crossmatch 2 units and we will transfuse 2 units and we will check hemoglobin level in the morning. We will also continue the patient on iron supplements.  2. Tachycardia, possibly related to anemia. We will follow after transfusion.  3. Wheezing, again possibly related to anemia, difficult to say at this point. We will be continuing the patient on DuoNebs. We will schedule them around the clock except at night.  4. Fever, likely urinary tract infection-related. Will get blood cultures as well as urine cultures and we will start the patient on Rocephin.  5. Dehydration. Continue patient on IV fluids after transfusion.  6. Hyponatremia, likely related to dehydration.  7. Hypokalemia. Check magnesium levels. Supplement IV.  8. Hyperglycemia. Get hemoglobin A1c to rule out diabetes mellitus.  9. Obesity. Get hemoglobin A1c as mentioned above. We will also check TSH as the patient is also tachycardic. We will follow. She, however,  has low albumin level. We will also check prealbumin as I am concerned about protein malnutrition.   TIME SPENT: One hour.   ____________________________ Theodoro Grist, MD rv:bjt D: 11/05/2011 46:65:99 ET T: 11/06/2011 07:04:47 ET JOB#: [id number]  cc: Theodoro Grist, MD, <Dictator> Lavera Guise, MD [place] MD ELECTRONICALLY SIGNED 12/13/2011 18:22

## 2014-07-10 NOTE — Consult Note (Signed)
CC GI bleed.  Pt hgb up to 8 after transfusion.  I plan to do EGD tomorrow and if neg will likely schedule colonoscopy for Wed or Thursday.  Chest clear today, abd not tender.  Electronic Signatures: Manya Silvas (MD)  (Signed on 19-Aug-13 19:12)  Authored  Last Updated: 19-Aug-13 19:12 by Manya Silvas (MD)

## 2014-07-10 NOTE — Consult Note (Signed)
PATIENT NAME:  Summer Murphy, LORTZ MR#:  [id number] DATE OF BIRTH:  1956-02-14  DATE OF CONSULTATION:  11/08/2011  REFERRING PHYSICIAN:  Dr. Tressia Miners  CONSULTING PHYSICIAN:  Julissa Browning R. Ma Hillock, MD  REASON FOR CONSULTATION: Persistent symptomatic anemia.   HISTORY OF PRESENT ILLNESS: Patient is a 59 year old female who has been admitted to hospital on 08/15 with complaints of not feeling well, she was found to be significantly anemic. Patient also has undergone recent total right knee replacement by Dr. Rudene Christians on 08/08. Hemoglobin earlier this month was 12.1 on 10/26/2011 with normal platelets and WBC count of 11,200, subsequently postoperatively hemoglobin on 08/09 was down to 9.4 grams. During this admission hemoglobin has drifted further down and it was 7.2 on 08/15, 7.0 on 08/17 and is 6.7 today. Patient denies any obvious bleeding symptoms but stool Hemoccult earlier today is reported positive. She feels very weak and tired on ambulation. Otherwise, denies any dyspnea at rest, orthopnea, chest pain, or paroxysmal nocturnal dyspnea. Has chronic arthritis, no new bone pains. Eating fairly steady.   PAST MEDICAL HISTORY/PAST SURGICAL HISTORY:  1. Peptic ulcer disease. 2. History of migraines. 3. Kidney stones. 4. Anxiety and depression. 5. Asthma. 6. Hospitalized in January 2012 with allergic reaction to vanilla.  7. Right knee osteoarthritis status post right knee replacement on 10/29/2011. 8. Appendectomy. 9. Cholecystectomy. 10. Hysterectomy. 11. Nissen fundoplication.   FAMILY HISTORY: Remarkable for hypertension and diabetes. Denies hematological disorders.   SOCIAL HISTORY: Denies smoking or alcohol usage. Currently was starting on rehab at [facility] after knee surgery.   HOME MEDICATIONS:  1. Amitriptyline 25 mg daily.  2. Benadryl 25 mg every four hours p.r.n.  3. Cardizem CD 180 mg daily.  4. Ambien 5 mg at bedtime.  5. Claritin 10 mg daily.  6. Cymbalta 30 mg daily.   7. Combivent 1 puff 4 times daily.  8. Dulcolax suppository 10 mg daily p.r.n.  9. DuoNeb 3 mL q.6 hours p.r.n.  10. Ferrous sulfate 325 mg daily.  11. Milk of magnesia 30 mL b.i.d. p.r.n.  12. MiraLax 17 grams daily for constipation as needed.  13. Mylanta 30 mL q.6 hours p.r.n.  14. Neurontin 400 mg p.o. b.i.d.  15. Oxycodone 5 to 10 mg q.4 hours p.r.n.  16. OxyContin 20 mg q.12 hours.  17. Premarin 0.45 mg daily.  18. Phenergan 25 mg q.6 hours p.r.n.  19. Protonix 40 mg daily.  20. Requip 0.25 mg t.i.d. p.r.n.  21. Senokot-S 1 tablet b.i.d.  22. Singulair 10 mg daily.  23. Tylenol Extra Strength p.r.n.  24. Valium 5 mg q.6 hours p.r.n.  25. Xarelto 10 mg daily up to 08/21 after knee replacement surgery.   REVIEW OF SYSTEMS: CONSTITUTIONAL: As in history of present illness. No fevers or chills. HEENT: Denies headaches, dizziness, epistaxis, ear or jaw pain. CARDIAC: As in history of present illness. LUNGS: Has some dyspnea on exertion. No new cough, sputum, or hemoptysis currently. GASTROINTESTINAL: As in history of present illness. No bright red blood in stools or melena. GENITOURINARY: No dysuria or hematuria. MUSCULOSKELETAL: As in history of present illness. SKIN: Has minor bruising. No new rashes or pruritus. MUSCULOSKELETAL: As in history of present illness. ENDOCRINE: No polyuria or polydipsia.   PHYSICAL EXAMINATION:  GENERAL: Patient is moderately built and nourished individual, tired looking, resting in bed, otherwise alert and oriented and converses appropriately. No icterus. Pallor present.   VITAL SIGNS: Temperature 97.6, pulse 92, respirations 18, blood pressure 106/69, 94% on room air.  HEENT: Normocephalic, atraumatic. Extraocular movements intact. Sclera anicteric. No oral thrush.   NECK: Negative for lymphadenopathy.   CARDIOVASCULAR: S1, S2, regular rate and rhythm.   LUNGS: Bilateral good air entry, decreased at bases, no rhonchi.   ABDOMEN: Soft,  nontender. No hepatosplenomegaly clinically.   EXTREMITIES: Trace edema.   SKIN: No major bruising or rashes.   LYMPHATICS: No adenopathy palpable in the axillary or inguinal areas either.   LABORATORY, DIAGNOSTIC, AND RADIOLOGICAL DATA: As in history of present illness above. Stool Hemoccult is positive today.   IMPRESSION AND RECOMMENDATION: 59 year old female patient with history of multiple medical problems as described above, normal hemoglobin earlier this month prior to knee surgery, then underwent total knee replacement on 08/08 after which hemoglobin has been around 9.3 grams, currently admitted with progressive weakness and worsening anemia. Also CT scan of the chest done to rule out pulmonary embolism on 08/15 reported innumerable irregular areas of ground glass attenuation throughout all lobes in both lungs, largest measuring 6 cm in left upper lobe anteriorly, felt to be most likely multifocal pneumonia. Patient currently on Zosyn and vancomycin. Regarding anemia, most likely etiologies include postoperative fall in hemoglobin worsened by GI blood loss given stool Hemoccult positive alongwith acute illness including b/l pneumonia. Recommend getting GI evaluation since she gives history of bleeding peptic ulcer in the past. Will await results of anemia work-up including LDH/haptoglobin/direct Coombs test, reticulocyte count, B12 and folate level, serum erythropoietin, iron studies. At this time, recommend continuing packed red blood cell transfusion support for quick improvement in hemoglobin. Once transfusions are done, if she has evidence of iron deficiency will consider parenteral iron therapy since she has already been taking oral iron therapy. If no obvious etiology is present, and GI bleeding is resolved, then may need further evaluation for anemia including bone marrow biopsy evaluation. Will continue to follow.   Thank you for the referral. Please feel to contact me if any additional  questions.  ____________________________ Rhett Bannister Ma Hillock, MD srp:cms D: 11/09/2011 01:13:48 ET T: 11/09/2011 07:04:55 ET JOB#: [id number]  cc: Chiron Campione R. Ma Hillock, MD, <Dictator> Alveta Heimlich MD ELECTRONICALLY SIGNED 11/09/2011 10:58

## 2014-07-10 NOTE — Consult Note (Signed)
CC GI bleeding.  Pt without further bleeding.  Hgb 9.5, WBC 10, plt374.  VSS afebrile, sat 91% on RA.  Will advance diet to mechanical soft tomorrow.  Eventually in 6-8 weeks will need a colonoscopy.  Electronic Signatures: Manya Silvas (MD)  (Signed on 21-Aug-13 17:56)  Authored  Last Updated: 21-Aug-13 17:56 by Manya Silvas (MD)

## 2014-07-10 NOTE — Discharge Summary (Signed)
PATIENT NAME:  Summer Murphy, Summer Murphy MR#:  [id number] DATE OF BIRTH:  1955-05-13  DATE OF ADMISSION:  11/05/2011 DATE OF DISCHARGE:  11/12/2011  PRIMARY CARE PHYSICIAN: Clayborn Bigness, MD   DISCHARGE DIAGNOSES:  1. Acute posthemorrhagic anemia.  2. Upper gastrointestinal bleeding with peptic ulcer disease.  3. Tachycardia due to anemia.  4. Wheezing related also to anemia.  5. Low-grade fever, possible urinary tract infection. Blood cultures with no growth. Urinalysis not significant for urinary infection. The patient was on Rocephin and Levaquin. and they were stopped at discharge.  6. History of depression and anxiety.  7. Kidney stones.  8. Asthma.  9. History of atypical migraines.   HOSPITAL COURSE: [name] is a very nice 59 year old female with a history of status post right knee replacement due to osteoarthritis, surgery done on 10/29/2011 by Dr. Rudene Christians. The patient was discharged to [facility] where she stayed from the 12th up to the admission date on the 15th. The patient was complaining of not feeling very good. She was very weak. She was found to be very anemic. Her blood count was 7.2. The patient also started getting really short of breath with occasional wheezing with just light  activity. She was dizzy and lightheaded, and her oxygen saturation went down to 90 to 91%. The patient was transferred to [facility], and she had a transfusion of 2 units of blood and iron supplementation. Dr. Rudene Christians was also consulted, and he followed up the patient on the case.   After the transfusion, the hemoglobin improved slightly up to 7.7 after 1 unit.The patient was referred to GI due to the history of peptic ulcer disease. Dr. Vira Agar evaluated the patient, thinking about possible GI bleeding from the upper tract since the patient was on Xarelto. This was stopped. They considered to do a colonoscopy if there were no findings on the upper GI, but since the upper GI showed gastric  ulcers with clean base and erythematous mucosa of the gastric body, they decided not to proceed with this. The patient was put on omeprazole twice daily and her diet was advanced. She was able to tolerate diet on the 21st, and on the 22nd she was able to be discharged.   As far as other medical problems:  1. The acute on chronic anemia improved after transfusion. Continue IV fluids.  2. Gastric ulcer history: The patient is to be on omeprazole twice daily and follow up with Dr. Vira Agar.  3. Mild asthma exacerbation: The patient was on nebulizers and prednisone taper. At this moment she is going to be okay to go home with her home inhalers.  4. CT scan showing multifocal pneumonia: The patient was on antibiotics, Zosyn and vancomycin. Her white count was for the most normal. Her repeat chest x-ray was clear for what she was kept on Levaquin. At this moment, I don't think there is a need to continue Levaquin since it was not clear that this was pneumonia in general, possibly was just pneumonitis due to irritation. The patient is going to go home without antibiotics.  5. Her high blood pressure was controlled with Cardizem.   DISPOSITION: At this moment, she is not going to go back to rehab. She is going home with Home Health.   DISCHARGE MEDICATIONS:   1. Gabapentin 400 mg b.i.d. 2. Oxycodone 5 mg every 4 to 6 hours p.r.n. pain.  3. Ferrous sulfate 325 mg once a day. 4. Cardizem CD 180 mg every 24  hours. 5. Claritin 10 mg p.r.n.  6. Combivent inhaler. 7. Premarin 0.45 tablet. 8. Requip 0.25 tablet.  9. Senokot 50 mg p.r.n.  10. Tylenol p.r.n.  11. Ambien 5 mg every night p.r.n.  12. Valium 5 mg every 6 hours p.r.n.  13. Omeprazole 20 mg delayed capsule every 12 hours. 14. Amitriptyline 25 mg once a day.       TIME SPENT:   I spent about 38 minutes with this patient  today. ____________________________ Phillips Sink, MD rsg:cbb D: 11/12/2011 15:42:15 ET T: 11/13/2011  10:28:48 ET JOB#: [id number]  cc: Springs Sink, MD, <Dictator> Lavera Guise, MD Roselie Awkward America Brown MD ELECTRONICALLY SIGNED 11/13/2011 13:44

## 2014-07-10 NOTE — Consult Note (Signed)
HEMATOLOGY followup note -feels better overall, fatigue improving. No abdominal pain.  no fevers. No bleeding Sxssitting in bed, A, O x 3, NAD. Mild pallor           vitals - afebrile, stable          CVS - S1S2, regular          Lungs - b/l good air entry          Abd - soft, NT, no hepatosplenomegaly clinically Hb 9.5, WBC 10.1K, platelets 374K. Haptoglobin, direct Coombs test, LDH, B12, folate, SIEP all unremarkable. Serum ferritin 122, serum Iron low at 27, iron saturation low at 10%. Stool OB positive.  59 year old female s/p total knee replacement on 08/08, currently admitted with progressive weakness and worsening anemia. Also CT scan of the chest done to rule out pulmonary embolism on 08/15 reported innumerable irregular areas of ground glass attenuation throughout all lobes in both lungs, largest measuring 6 cm in left upper lobe anteriorly, felt to be most likely multifocal pneumonia. Patient improving on antibiotics. Anemia is secondary to iron deficiency and GI blood loss, stool OB positive and EGD showed 2 non-bleeding ulcers in stomach. Hb 9.5 today, will give one dose of pareneteral iron therapy with Feraeme 510 mg IV today. Will continue to follow as outpatient.  Electronic Signatures: Jonn Shingles (MD)  (Signed on 21-Aug-13 21:38)  Authored  Last Updated: 21-Aug-13 21:38 by Jonn Shingles (MD)

## 2014-07-12 DIAGNOSIS — J449 Chronic obstructive pulmonary disease, unspecified: Secondary | ICD-10-CM | POA: Diagnosis not present

## 2014-07-12 DIAGNOSIS — F502 Bulimia nervosa: Secondary | ICD-10-CM | POA: Diagnosis not present

## 2014-07-12 DIAGNOSIS — G43909 Migraine, unspecified, not intractable, without status migrainosus: Secondary | ICD-10-CM | POA: Diagnosis not present

## 2014-07-12 DIAGNOSIS — I10 Essential (primary) hypertension: Secondary | ICD-10-CM | POA: Diagnosis not present

## 2014-07-12 DIAGNOSIS — F331 Major depressive disorder, recurrent, moderate: Secondary | ICD-10-CM | POA: Diagnosis not present

## 2014-07-13 NOTE — Discharge Summary (Signed)
PATIENT NAME:  Summer Murphy, Summer Murphy MR#:  [id number] DATE OF BIRTH:  08-20-1955  DATE OF ADMISSION:  10/05/2012 DATE OF DISCHARGE:  10/07/2012  ADMITTING DIAGNOSIS: Shortness of breath and cough.   DISCHARGE DIAGNOSES: 1.  Shortness of breath and cough due to acute asthma exacerbation, now symptoms significantly improved.  2.  Chronic left bundle branch block.  3.  Anxiety and depression.  4.  Migraine headaches.  5.  History of kidney stones.  6.  History of peptic ulcer disease.  7.  History of Barrett's esophagus.  8.  Previous history of hypoglycemia.  9.  Osteoarthritis.   CONSULTANTS: None.   PERTINENT LABS AND EVALUATIONS: Admitting glucose 97, BUN 11, creatinine 0.70, sodium 140, potassium 3.8, chloride 104, CO2 27 and calcium 9.0. LFTs were normal. Troponin less than 0.02. WBC 8.0, hemoglobin 13.4 and platelet count 283.   EKG showed a chronic left bundle branch block, normal sinus rhythm.   Chest x-ray showed no pneumonia, large hiatal hernia.   HOSPITAL COURSE: Please refer to H and P done by the admitting physician. The patient is a 59 year old white female with history of asthma, GERD and anxiety who presented with complaint of shortness of breath. The patient was seen in the ED and was having heart rate in the 120s and was continued to be short of breath. Therefore, she was admitted for asthma exacerbation. The patient was treated with nebulizers and breathing treatments, and her breathing got significantly improved. She is doing much better today and is stable for discharge. She reports that she is unable to take p.o. steroids. At this time, she is stable for discharge.   DISCHARGE MEDICATIONS:  1.  Cardizem CD 180 mg daily. 2.  Combivent 1 puff 4 times a day. 3.  Premarin 0.45 mg 1 tab p.o. at bedtime. 4.  Tylenol 1 to 2 tabs q. 4 hours p.r.n. 5.  Gabapentin 400 mg 1 tab p.o. b.i.d. 6.  Singulair 10 mg daily. 7.  Cymbalta 60 mg daily. 8.  Amitriptyline 50 mg at  bedtime. 9.  Omeprazole 20 mg 1 tab p.o. b.i.d. 10.  Albuterol nebulizers q. 6 hours p.r.n. 11.  Acetaminophen/hydrocodone 325/5 mg 1 tab p.o. q. 8 hours p.r.n. for headache.   DISCHARGE DIET: Regular.   DISCHARGE ACTIVITY: As tolerated.   DISCHARGE FOLLOWUP: With primary MD in 1 to 2 weeks.   TIME SPENT ON DISCHARGE:  32 minutes. ____________________________ Lafonda Mosses Posey Pronto, MD shp:sb D: 10/07/2012 14:30:22 ET T: 10/07/2012 15:50:44 ET JOB#: [id number]  cc: Jalyn Dutta H. Posey Pronto, MD, <Dictator> Alric Seton MD ELECTRONICALLY SIGNED 10/09/2012 13:11

## 2014-07-13 NOTE — H&P (Signed)
PATIENT NAME:  Summer Murphy, ROGEL MR#:  [id number] DATE OF BIRTH:  June 07, 1955  DATE OF ADMISSION:  10/05/2012  PRIMARY CARE PHYSICIAN: Clayborn Bigness.   PRIMARY PULMONOLOGIST: Dr. Devona Konig.   CHIEF COMPLAINT: Shortness of breath and cough.   HISTORY OF PRESENT ILLNESS: A 59 year old female patient with a history of asthma, GERD, anxiety, presents to the hospital complaining of shortness of breath. The patient has had this going on for about 5 days now. She was at her primary care physician's office yesterday, had an IM dose of Solu-Medrol with no response. The patient today, in the Emergency Room, has received multiple nebulizers along with IV steroids including Decadron, still significant chest tightness, wheezing, shortness of breath and is being admitted to the hospitalist service. The patient is also tachycardic into the 110s.   She does not have any chest pain. Dry cough. No nausea, vomiting, abdominal pain, dysuria. Her pulmonologist thinks she has allergic asthma.   PAST MEDICAL HISTORY:  1. Asthma.  2. Migraines.  3. Anxiety, depression.  4. Kidney stones.  5. Peptic ulcer disease.  6. Barrett's esophagus.  7. Migraines.  8. Episodes of hypoglycemia.  9. Osteoarthritis.   HOME MEDICATIONS: Include: 1. Ambien 5 mg oral at bedtime.  2. Amitriptyline 25 mg oral daily.  3. Cardizem CD 180 mg oral daily.  4. Claritin 10 mg oral daily.  5. Combivent Respimat 1 puff 4 times daily.  6. Cymbalta 60 mg oral daily.  7. DuoNeb 3 mL every 6 hours as needed.  8. Neurontin 300 mg oral twice daily.  9. Premarin 0.45 mg oral daily.  10. Protonix 40 mg oral daily.  11. Singulair 10 mg oral daily.   PAST SURGICAL HISTORY: Appendectomy, cholecystectomy, hysterectomy, Nissen fundoplication and right knee osteoarthritis with total right knee replacement.   SOCIAL HISTORY: The patient lives alone. She has a dog at home. Does not smoke. No alcohol. No illicit drugs.   FAMILY HISTORY:  Hypertension, diabetes.   ALLERGIES: REGLAN, ZITHROMAX, ASPIRIN, LIBRIUM, CLINDAMYCIN AND VANILLA.   REVIEW OF SYSTEMS:  CONSTITUTIONAL: Complains of fatigue and weakness. No fever, weight loss, weight gain.  EYES: No blurred vision, pain.  ENT: No tinnitus, ear pain, hearing loss.  RESPIRATORY: Has dry cough, wheezing, shortness of breath, asthma.  CARDIOVASCULAR: No chest pain, orthopnea, edema.  GASTROINTESTINAL: No nausea, vomiting, diarrhea, abdominal pain.  GENITOURINARY: No dysuria, hematuria, frequency.  ENDOCRINE: No polyuria, nocturia, thyroid problems.  HEMATOLOGIC AND LYMPHATIC: No anemia, easy bruising, bleeding.  INTEGUMENTARY: No acne, rash, lesions.  MUSCULOSKELETAL: No back pain, arthritis.  NEUROLOGIC: No focal numbness, weakness, dysarthria.  PSYCHIATRIC: Has anxiety, depression.   PHYSICAL EXAMINATION:  VITAL SIGNS: Temperature 98.1, pulse of 118, respirations 30, blood pressure 129/93, saturating 97% on 2 liters oxygen.  GENERAL: Obese Caucasian female patient sitting up in bed in significant respiratory distress. Having conversational dyspnea.  PSYCHIATRIC: Alert, oriented x3. Mood and affect appropriate. Judgment intact.  HEENT: Atraumatic, normocephalic. Oral mucosa moist and pink. External ears and nose normal. No pallor. No icterus. Pupils bilaterally equal and reactive to light.  NECK: Supple. No thyromegaly. No palpable lymph nodes. Trachea midline. No carotid bruit, JVD.  CARDIOVASCULAR: S1, S2, tachycardic without any murmurs. Peripheral pulses 2+. No edema.  RESPIRATORY: Decreased air entry on both sides with bilateral wheezing and increased work of breathing.  GASTROINTESTINAL: Soft abdomen, nontender. Bowel sounds present. No hepatosplenomegaly palpable.  GENITOURINARY: No CVA tenderness or bladder distention.  SKIN: Warm and dry. No petechiae, rash, ulcers.  MUSCULOSKELETAL: No joint swelling, redness, effusion of the large joints. Normal muscle tone.   NEUROLOGICAL: Motor strength 5 out of 5 in upper and lower extremities. Sensation is intact all over.  LYMPHATIC: No cervical, supraclavicular lymphadenopathy.   LAB STUDIES: Show glucose 97, BUN 11, creatinine 0.70, sodium 140, potassium 3.8, chloride 104. Troponin less than 0.02. WBC 9, platelets of 283.   EKG shows left bundle branch block which is chronic.   Chest x-ray shows no acute abnormalities.   ASSESSMENT AND PLAN:  1. Acute exacerbation of asthma: Will start the patient on intravenous steroids, scheduled nebulizers. Afebrile. No infiltrate. No WBC. Will not need any antibiotics. Will continue home inhalers along with her Singulair, DuoNeb around-the-clock. The patient has significant wheezing and chest tightness at this time.  2. Left bundle branch block: Chronic.  3. Anxiety, depression: Continue medications.  4. Deep venous thrombosis prophylaxis with Lovenox.   TIME SPENT TODAY ON THIS CASE: 35 minutes.    ____________________________ Leia Alf Kayci Belleville, MD srs:gb D: 10/05/2012 20:38:38 ET T: 10/05/2012 20:55:48 ET JOB#: [id number]  cc: Alveta Heimlich R. Darvin Neighbours, MD, <Dictator> Lavera Guise, MD Allyne Gee, MD Neita Carp MD ELECTRONICALLY SIGNED 10/09/2012 23:41

## 2014-07-14 NOTE — H&P (Signed)
PATIENT NAME:  Murphy, Summer MR#:  [id number] DATE OF BIRTH:  [birth year]  DATE OF ADMISSION:  06/12/2013  PRIMARY CARE PHYSICIAN: Dr. Clayborn Bigness.   REFERRING PHYSICIAN: Dr. Delman Kitten    CHIEF COMPLAINT: Shortness of breath.   HISTORY OF PRESENT ILLNESS:  [name] is a 59 year old, pleasant, white female with a history of multiple medical problems including hypertension, hyperlipidemia, asthma, depression, anxiety, who presented to the Emergency Department with complaints of severe shortness of breath. The patient states the symptoms started on Thursday with a sinus infection, generalized body aches and fever which quickly worsened with severe shortness of breath. Today, the patient was unable to breathe, concerning this, called EMS and was brought to the Emergency Department. The patient was tachypneic, with severe chest tightness in the lungs. The patient received 1 breathing treatment at home and 3 breathing treatments in the Emergency Department, still has multiple poor air entry in multiple areas. The patient is unable to speak full sentences. Had some subjective fevers at home. ABGs not done. Lab workup is unremarkable. Chest x-ray: No active disease. The patient received levofloxacin, Solu-Medrol and magnesium sulfate.   PAST MEDICAL HISTORY: 1. Hypertension.  2. Hyperlipidemia.  3. Diabetes mellitus.  4. Nephrolithiasis.   5. Osteoporosis.  6. History of herpes zoster.  7. Migraine headaches.  8. Restless leg syndrome.  9. Cardiac arrhythmia status post cardiac ablation.  10. Anxiety/depression.  11. History of peptic ulcer disease.  12. Chronic anemia.  13. Osteoarthritis.  14. Hiatal hernia.   PAST SURGICAL HISTORY: 1. Hysterectomy.  2. Cholecystectomy.  3. Appendectomy.  4. Lumbar surgery.  5. Nissen fundoplication.  ALLERGIES:  1. CLINDAMYCIN.  2. REGLAN.  3. ASPIRIN.  4. LIBRIUM. 5. ZITHROMAX.   HOME MEDICATIONS: 1. Tylenol 1 to 2 tablets every 4 hours as  needed.  2. Tiotropium 18 mcg once a day.  3. Singulair 10 mg once a day.  4. Premarin 0.4 mg once a day.  5. Zofran 8 mg 3 times a day.  6. Omeprazole 20 mg 2 times a day.  7. Norco 5/325 mg every 4 to 6 hours as needed.  8. Gabapentin 400 mg 2 times a day.  9. Fioricet 1 to 2 tablets every 6 hours as needed.  10. Cymbalta 60 mg once a day.  11. Combivent 1 puff 4 times a day.  12. Cardizem CD 180 mg once a day.  13. Calcium with vitamin D once a day.  14     1 tablet 2 times a day.  15. Benzonatate 100 mg every 4 hours.  16. Amitriptyline 50 mg once a day.  17. Albuterol 2.5 mg.   SOCIAL HISTORY: No history of smoking, drinking alcohol or using illicit drugs. Lives at home.   FAMILY HISTORY: Positive for diabetes mellitus.   REVIEW OF SYSTEMS:  CONSTITUTIONAL: Generalized weakness, fever.  EYES: No change in vision.  ENT: Has sore throat, sinus drainage.  RESPIRATORY: Has cough, shortness of breath.  CARDIOVASCULAR: No chest pain, palpitations.  GASTROINTESTINAL: No nausea, vomiting, abdominal pain.  GENITOURINARY: No dysuria or hematuria.  ENDOCRINE: No polyuria or polydipsia.  HEMATOLOGIC: No easy bruising or bleeding.  SKIN: No rash or lesions.  MUSCULOSKELETAL: No joint pains; has chronic back pain.  NEUROLOGIC: No weakness or numbness in any part of the body.  PSYCHIATRIC: Has anxiety/depression.   PHYSICAL EXAMINATION: GENERAL: This is a well-built, well-nourished, age-appropriate female. VITAL SIGNS: Temperature 97.6, pulse 126, blood pressure 128/72, respiratory rate of 32,  oxygen saturation 100% on room air.  HEENT: Head normocephalic, atraumatic. No scleral icterus. Conjunctivae normal. Pupils equal and react to light. Extraocular movements are intact. Mucous membranes moist. No pharyngeal erythema.  NECK: Supple. No lymphadenopathy. No JVD. No carotid bruit. No thyromegaly.  CHEST: Has no focal tenderness. Has multiple areas of poor air entry. Has diffuse  wheezing.  HEART: S1, S2, regular, tachycardia.  ABDOMEN: Bowel sounds present. Soft, nontender, nondistended. No hepatosplenomegaly.  EXTREMITIES: No pedal edema. Pulses 2+.  NEUROLOGIC: The patient is alert, oriented to place, person and time. Cranial nerves II through XII intact. Motor 5/5 in upper and lower extremities.   LABORATORIES: CBC, CMP are completely within normal limits. Chest x-ray: No acute cardiopulmonary disease. Troponin less than 0.02.   ASSESSMENT AND PLAN: [name] is a 59 year old [place] who comes to the Emergency Department with asthma exacerbation.  1. Asthma exacerbation. The patient has severe intensity with poor air entry in multiple areas. Will continue with the BiPAP, admit the patient to the step-down unit of the Intensive Care Unit. Continue with the breathing treatments every 2 hours. Continue with the Solu-Medrol. Keep the patient on levofloxacin.  2. Hypertension. Currently well controlled. Continue with home medications  3. Hypoxic respiratory failure secondary to asthma exacerbation. Continue with the BiPAP and oxygen.  4. History of supraventricular tachycardia status post ablation. Continue the Cardizem.  5. Keep the patient on deep vein thrombosis prophylaxis with Lovenox.   TIME SPENT: 50 minutes.     ____________________________ Monica Becton, MD pv:lm D: 06/12/2013 22:13:37 ET T: 06/12/2013 23:23:22 ET JOB#: [id number]  cc: Monica Becton, MD, <Dictator> Lavera Guise, MD Grier Mitts Fareedah Mahler MD ELECTRONICALLY SIGNED 07/06/2013 0:20

## 2014-07-14 NOTE — Op Note (Signed)
PATIENT NAME:  Summer Murphy, Summer Murphy MR#:  [id number] DATE OF BIRTH:  February 15, 1956  DATE OF PROCEDURE:  10/26/2013  PREOPERATIVE DIAGNOSIS: Left knee severe osteoarthritis.   POSTOPERATIVE DIAGNOSIS: Left knee severe osteoarthritis.   PROCEDURE: Left total knee replacement.   SURGEON: Hessie Knows, M.D.   ASSISTANT:  Reche Dixon, PA-C.   ANESTHESIA: Spinal.   DESCRIPTION OF PROCEDURE: The patient was brought to the operating room after adequate spinal anesthesia was obtained; left knee was prepped and draped in the usual sterile fashion with a tourniquet applied to the upper thigh. The patient identification and timeout procedures were completed. The tourniquet was raised to 300 mmHg. A midline skin incision was made, followed by median parapatellar arthrotomy. Inspection of the knee revealed exposed bone over, what should have been articular surface, with loss of essentially all cartilage.  There is also chondrocalcinosis present and the patella had worn a groove in the lateral femoral condyle. The ACL was intact as were the menisci. The ACL and menisci were excised and the proximal tibia exposed to allow for application of the proximal tibia, Medacta cutting block. This was applied and alignment checked.  The proximal tibia cut was carried out and bone removed. The identical procedure was carried out on the femur and femoral cuts made. With pins placed off the initial guide, a 3 cutting block was applied.  Anterior, posterior and chamfer cuts were then made.  With this bone removed, it is possible to remove residual posterior meniscus at this time and elevate the posterior capsule.  A large posterior Baker's cyst was drained at this time as well with direct pressure and opening up the posterior capsule. The tibial 2 trial was placed with alignment rod checked.  Checking this, gave good position of the tibial component.  It was pinned in place, proximal drill hole carried out followed by the keel punch. The  femoral trial was placed and with an 11 mm insert, there was good stability and motion.  The distal femoral drill holes were then made and the notch cut made for the trochlear groove.  These trial components were reviewed. Inspection of the patella revealed extensive wear of the patella with central wear.  Spurs were removed around the periphery and it was measured and cut down to approximately 12 mm with an inlay patella, central drill hole and peg hole made for resurfacing.  A 28 mm patella gave good coverage.   The tourniquet was let down with hemostasis being checked with electrocautery. The knee was also then injected with a combination of Exparel diluted with saline, along with morphine, Toradol and Sensorcaine with epinephrine diluted with saline in the periarticular tissues. The tourniquet was raised and the bony surfaces thoroughly irrigated and dried. The tibial component was cemented into place with excess cement removed. Placement of the 11 mm trial insert, followed by the femoral component. The knee was held in extension. All components cemented, followed by cementing the patellar button. After the cement had set, excess cement was removed and the knee was again thoroughly irrigated. The final 11 mm insert was placed and set screw inserted. Lateral release was carried out because of the significant valgus preoperatively, although it was not overly tight at the close of the case. Tourniquet was let down. Hemostasis was again checked with electrocautery. The arthrotomy was repaired with an Ethibond suture at the proximal and distal poles of the patella, followed by a heavy Quill suture, 2-0 Vicryl and Quill suture subcutaneously and skin staples.  Xeroform, 4 x 4's, ABD, Webril, Polar Care and tape.   CONDITION: To recovery room in stable.   ESTIMATED BLOOD LOSS: 150.   TOURNIQUET TIME: 64 minutes.   IMPLANTS: GMK sphere primary femoral component size left 3 with a left 2 fixed tibial tray, a 2  left 11 mm flex sphere tibial insert and a 28 mm inset patella. All components cemented.   ____________________________ Laurene Footman, MD mjm:ds D: 10/26/2013 17:19:54 ET T: 10/26/2013 21:33:05 ET JOB#: [id number]  cc: Laurene Footman, MD, <Dictator> Laurene Footman MD ELECTRONICALLY SIGNED 10/27/2013 23:33

## 2014-07-14 NOTE — H&P (Signed)
PATIENT NAME:  Summer Murphy, Summer Murphy MR#:  [id number] DATE OF BIRTH:  [birth year]  DATE OF ADMISSION:  04/16/2013  REFERRING PHYSICIAN: Dr. Corky Downs  FAMILY PHYSICIAN: Dr. Clayborn Bigness   PULMONOLOGIST:  Dr.  Humphrey Rolls   REASON FOR ADMISSION: Acute respiratory failure.   HISTORY OF PRESENT ILLNESS: The patient is a 59 year old female with a history of COPD and multiple other medical issues, who presents to the Emergency Room with a 2 to 3-day history of progressive shortness of breath and cough. In the Emergency Room, the patient was profoundly hypoxic and dyspneic, requiring BiPAP. She is extremely anxious. She was given magnesium and IV steroids in the Emergency Room with nebulizer treatments, with no improvement. She is now admitted for further evaluation.   PAST MEDICAL HISTORY: 1.  COPD/asthma.  2.  Hyperglycemia.  3.  Nephrolithiasis. 4.  Osteoporosis.  5.  History of herpes zoster.  6.  Migraine headaches.  7.  Restless legs syndrome.  8.  History of cardiac arrhythmia, status post cardiac ablation.  9.  Anxiety/depression.  10.  History of peptic ulcer disease.  11.  Chronic anemia.  12.  Osteoarthritis.  13.  Benign hypertension. 14.  Hiatal hernia.  15.  Status post hysterectomy.  16.  Status post cholecystectomy. 17.  Status post appendectomy.  18.  Status post lumbar surgery.  19.  Status post Nissen fundoplication.   MEDICATIONS ON ADMISSION:  1.  Singulair 10 mg p.o. daily.  2.  Premarin 0.5 mg p.o. at bedtime.  3.  Omeprazole 20 mg p.o. b.i.d.  4.  Gabapentin 400 mg p.o. b.i.d.  5.  Cymbalta 60 mg p.o. daily.  6.  Combivent 1 puff q.i.d.  7.  Cardizem CD 180 mg p.o. daily.  8.  Elavil 50 mg p.o. at bedtime.  9.  DuoNeb SVN q. 6 hours p.r.n. shortness of breath.  10.  Norco 1 p.o. q. 8 hours p.r.n. pain.   ALLERGIES: CLINDAMYCIN, REGLAN, ASPIRIN, LIBRIUM, ZITHROMAX.   SOCIAL HISTORY: Negative for alcohol or tobacco abuse.   FAMILY HISTORY: Positive for diabetes, but  otherwise unremarkable.   REVIEW OF SYSTEMS:   CONSTITUTIONAL: No fever or change in weight.  EYES: No blurred or double vision. No glaucoma.  ENT: No tinnitus or hearing loss. No nasal discharge or bleeding. No difficulty swallowing.  RESPIRATORY: No hemoptysis. No painful respiration.  CARDIOVASCULAR: No chest pain or orthopnea. No palpitations or syncope.  GASTROINTESTINAL: The patient has had nausea, but no vomiting or diarrhea. No abdominal pain or change in bowel habits.  GENITOURINARY: No dysuria or hematuria. No incontinence.  ENDOCRINE: No polyuria or polydipsia. No heat or cold intolerance.  HEMATOLOGIC: The patient denies easy bruising or bleeding.  LYMPHATIC: No swollen glands. MUSCULOSKELETAL:  The patient has occasional back pain, but denies neck, shoulder, knee or hip pain. No gout.  NEUROLOGIC:  No numbness or recent migraines. Denies stroke or seizures.  PSYCHIATRIC: The patient admits to anxiety, but denies insomnia or depression.   PHYSICAL EXAMINATION: GENERAL: The patient is anxious, in moderate respiratory distress.  VITAL SIGNS: Currently remarkable for a blood pressure of 104/67, heart rate of 104, respiratory rate of 24, temperature of 98.1.  HEENT: Pupils equally round, reactive to light and accommodation. Extraocular movements are intact. Sclerae are not icteric. Conjunctivae are clear. Oropharynx is clear.  NECK: Supple, without JVD or bruits. No adenopathy or thyromegaly is noted.  LUNGS: Reveal decreased breath sounds, with expiratory wheezes and scattered rhonchi. No dullness. Respiratory effort is  increased.  CARDIAC: Rapid rate, with a regular rhythm. Normal S1 and S2. No significant rubs, murmurs, or gallops. PMI is nondisplaced. Chest wall is nontender.  ABDOMEN: Soft, nontender, with normoactive bowel sounds. No organomegaly or masses were appreciated. No hernias or bruits were noted.  EXTREMITIES: Without clubbing, cyanosis, or edema. Pulses were 2+  bilaterally.  SKIN: Warm and dry, without rash or lesions.  NEUROLOGIC: Cranial nerves II through XII grossly intact. Deep tendon reflexes were symmetric. Motor and sensory exams nonfocal.  PSYCHIATRIC: Exam revealed the patient is alert and oriented to person, place, and time. She was cooperative and used good judgment.   LABORATORY DATA: Chest x-ray revealed normal heart size, with clear lungs. White count was 9.0, with a hemoglobin of 13.8. Glucose is 101, with a BUN of 18, creatinine of 0.69, with a sodium of 136 and a potassium of 3.9, with a troponin of less than 0.02.   ASSESSMENT: 1.  Acute respiratory failure requiring BiPAP.  2.  Chronic obstructive pulmonary disease exacerbation. 3.  Presumed bronchitis.  4.  History of cardiac arrhythmias.  5.  Anxiety.  6.  Hyperglycemia.  7.  Benign hypertension.   PLAN: The patient will be admitted to the Intensive Care Unit on BiPAP with IV steroids, IV antibiotics, and DuoNeb SVN. Will continue Singulair, and add Spiriva and Symbicort. Will send off sputum for Gram stain and culture. Will obtain a Pulmonary consult because of her acute respiratory failure and underlying lung disease. Will follow her sugars with Accu-Cheks before meals and at bedtime, and add sliding scale insulin as needed while on steroids. Begin routine IV fluids. Follow up chest x-ray and labs in the morning. Wean off BiPAP as tolerated.    Further treatment and evaluation will depend upon the patient's progress.   Total time spent on this patient was 50 minutes.    ____________________________ Leonie Douglas Doy Hutching, MD jds:mr D: 04/16/2013 16:48:34 ET T: 04/16/2013 18:00:16 ET JOB#: [id number]  cc: Leonie Douglas. Doy Hutching, MD, <Dictator> Lavera Guise, MD  Theoplis Garciagarcia Lennice Sites MD ELECTRONICALLY SIGNED 04/17/2013 8:01

## 2014-07-14 NOTE — Op Note (Signed)
PATIENT NAME:  Summer Murphy, ENGELMANN MR#:  [id number] DATE OF BIRTH:  03/07/1956  DATE OF PROCEDURE:  03/01/2014  PREOPERATIVE DIAGNOSIS: Synovitis, left total knee.   POSTOPERATIVE DIAGNOSIS: Synovitis, left total knee.   PROCEDURE: Arthroscopy, partial synovectomy, left knee.   ANESTHESIA: General.   SURGEON: Laurene Footman, MD   DESCRIPTION OF PROCEDURE: The patient was brought to the operating room, and after adequate anesthesia was obtained, the left leg was placed in the arthroscopic leg holder with a tourniquet applied. After prepping and draping in the usual sterile fashion, appropriate patient identification and timeout procedures were completed. An inferolateral portal was made and the arthroscope was introduced. Initial inspection revealed thick bands of scar tissue and synovium in the suprapatellar pouch as well as anteriorly, particularly anterolaterally, with some thick scar from the notch to the synovium. This was consistent with symptomatic scar tissue and synovitis. An inferomedial portal was made and the arthroscopic shaver was introduced, debriding the scar tissue and synovium so it would no longer impinge up at the patellofemoral joint as well as anterolaterally. These were the main areas of removal of the tissue with pre- and postprocedure pictures obtained. The total joint itself appeared to be functioning appropriately, with a midline position of the patella, no subluxation, and normal appearance to the implants. After debriding the synovium from the suprapatellar pouch and the anterior lateral compartment as well as a small portion medially, all instrumentation was withdrawn. The wounds were closed with simple interrupted 4-0 nylon skin sutures. Xeroform, 4 x 4's, Webril and Ace wrap applied, and the patient was sent to the recovery room in stable condition.   ESTIMATED BLOOD LOSS: Minimal.   COMPLICATIONS: None.   SPECIMEN: None.    ____________________________ Laurene Footman, MD mjm:ST D: 03/01/2014 18:32:34 ET T: 03/02/2014 03:57:07 ET JOB#: [id number]  cc: Laurene Footman, MD, <Dictator> Laurene Footman MD ELECTRONICALLY SIGNED 03/02/2014 7:10

## 2014-07-14 NOTE — Discharge Summary (Signed)
PATIENT NAME:  Summer Murphy, Summer Murphy MR#:  [id number] DATE OF BIRTH:  07-12-1955  DATE OF ADMISSION:  04/16/2013 DATE OF DISCHARGE:  04/18/2013  PRESENTING COMPLAINT:  Shortness of breath.   DISCHARGE DIAGNOSES:  1.  Acute asthma exacerbation, improved.  2.  Saturations 95% on room air.   CODE STATUS:  FULL CODE.   MEDICATIONS:  1.  Cardizem CD 180 mg extended release p.o. daily.  2.  Combivent 1 puff 4 times a day as needed.  3.  Premarin 0.45 mg at bedtime.  4.  Tylenol 1 to 2 every 4 hours as needed.  5.  Gabapentin 400 mg 1 capsule b.i.d. as needed.  6.  Cymbalta 60 mg p.o. daily.  7.  Amitriptyline 50 mg at bedtime as needed.  8.  Omeprazole 20 mg p.o. b.i.d.  9.  Albuterol nebulizer every 6 hours as needed.  10.  Singulair 10 mg p.o. at bedtime.  11.  Fioricet 325/50/40 mg, 1 tablet every 6 hours as needed.  12.  Calcium with vitamin D 500 mg 1 tablet p.o. daily.  13.  Budesonide/formoterol160/4.5 1 puff b.i.d.   14.  Spiriva 1 capsule inhalation daily.  15.  Augmentin 875 mg p.o. b.i.d.   FOLLOW UP:  Dr. Devona Konig in 1 to 2 weeks.   BRIEF SUMMARY OF HOSPITAL COURSE:  [name] is a 59 year old Caucasian female with a history of asthma, comes in with: 1.  Acute on chronic exacerbation of asthma. It improved slowly. She received IV steroids. The patient is unable to take p.o. prednisone. It makes her very nervous and jittery. Nebulizer inhalers were continued, empirically on Levaquin, which was changed to p.o. Augmentin since the patient was able to afford more affordable. White count remained in stable. Chest x-ray showed no pneumonia.  2.  Gastroesophageal reflux disease. On PPI.  3.  Anxiety depression. The patient is on Cymbalta and p.r.n. Xanax.  4.  Migraine headaches. Resume Fioricet.   Overall improved. The patient requesting to go home. The patient was discharged in a stable condition.   TIME SPENT: 40 minutes.    ____________________________ Hart Rochester Posey Pronto,  MD sap:jm D: 04/20/2013 15:25:00 ET T: 04/20/2013 17:06:23 ET JOB#: [id number]  cc: Duel Conrad A. Posey Pronto, MD, <Dictator> Ilda Basset MD ELECTRONICALLY SIGNED 05/04/2013 13:15

## 2014-07-14 NOTE — Discharge Summary (Signed)
PATIENT NAME:  Summer Murphy, Summer Murphy MR#:  [id number] DATE OF BIRTH:  [birth year]  DATE OF ADMISSION:  10/26/2013 DATE OF DISCHARGE:  10/30/2013  ADMITTING DIAGNOSIS: Left knee severe osteoarthritis.   DISCHARGE DIAGNOSIS: Left knee severe osteoarthritis.   PROCEDURE: Left total knee replacement.   SURGEON: Laurene Footman, M.D.   ASSISTANT: Reche Dixon, PA-C.   ANESTHESIA: Spinal.   CONDITION: To recovery room stable.   ESTIMATED BLOOD LOSS: 150 mL.  TOURNIQUET TIME: 64 minutes.   IMPLANTS: GMK sphere primary femoral component size left 3 with a left 2 fixed tibial tray, a 2 left 11 mm Flex sphere tibial insert, and a 28 mm insert patella. All components cemented.   HISTORY: The patient was evaluated for left knee osteoarthritis. She has had injections with no relief. She had a prior right total knee arthroplasty with good results. Pain is mostly along the anterior lateral, posterior aspect of the knee. The pain is sharp and is severe which is affecting her activities of daily living.   PHYSICAL EXAMINATION: HEENT: Normal.  HEART: Regular rate and rhythm.  LUNGS: Clear to auscultation bilaterally. No wheezing, rales or rhonchi.  LOWER EXTREMITIES: Left knee: She has 5 to 95 degrees of range of motion. Her patellofemoral compartment has significant crepitation. She has good strength. She is neurovascularly intact in the left lower extremity.   HOSPITAL COURSE: The patient was admitted to the hospital on October 26, 2013. She had surgery that same day and was brought to the orthopedic floor from the PACU in stable condition. On postop day 1, she had significant pain and pain medications were adjusted to OxyContin 10 mg b.i.d., tramadol 50 mg 1 to 2 tablets p.o. q. 4 to 6 hours as needed for pain, as well as Nucynta 50 mg 1 to 2 tablets p.o. q. 4 to 6 hours as needed for pain. She had slow progress with physical therapy. On postop day 2 and 3, she continued to make slow progress. On postop day  4, it was determined that she would go to a rehab facility. The patient's vital signs are stable. She was stable and ready for discharge to a rehab facility 10/30/13.  CONDITION: Stable.   DISCHARGE INSTRUCTIONS: The patient may increase weight-bearing on the effected extremity. Elevate the effected foot or leg on 1 or 2 pillows with the foot higher than the knee. Thigh-high TED hose on both legs and remove 1 hour per 8 hour shift. Elevate the heels off the bed. Use incentive spirometer every hour while awake and encourage cough and deep breathing. The patient may resume a regular diet as tolerated. Continue using Polar Care unit maintaining a temp between 40 and 50 degrees. Do not get the dressing or bandage wet or dirty. Call [facility] orthopedics if the dressing gets water under it. Leave the dressing on. Call [facility] orthopedics if any of the following occur: Bright red bleeding from the incisional wound, fever above 101.5 degrees, redness, swelling, or drainage at the incision. Call [facility] orthopedics if you experience any increased leg pain, numbness or weakness in your legs or bowel or bladder symptoms. Physical therapy has been arranged for continuation of rehab program at [facility] facility. She should follow up with [facility] orthopedics in 2 weeks.   DISCHARGE MEDICATIONS: Please see discharge instructions for list of discharge medications.   ____________________________ T. Rachelle Hora, PA-C tcg:sb D: 10/30/2013 08:14:00 ET T: 10/30/2013 08:38:36 ET JOB#: [id number]  cc: T. Rachelle Hora, PA-C, <Dictator>  Duanne Guess PA ELECTRONICALLY SIGNED 11/04/2013 0:09

## 2014-07-14 NOTE — Discharge Summary (Signed)
PATIENT NAME:  Summer Murphy, Summer Murphy MR#:  [id number] DATE OF BIRTH:  [birth year]  DATE OF ADMISSION:  06/12/2013 DATE OF DISCHARGE:  06/16/2013  PRIMARY CARE PHYSICIAN: Lavera Guise, MD  DISCHARGE DIAGNOSES: 1.  Acute respiratory failure due to asthma exacerbation.  2.  Hypertension.  3.  Anxiety.   CONDITION: Stable.   CODE STATUS: Full code.   HOME MEDICATIONS: Please refer to the medication reconciliation list.   DIET: Low-sodium diet.   ACTIVITY: As tolerated.   FOLLOWUP CARE: Follow up with PCP within 1 to 2 weeks. Follow up with pulmonary, Dr. Humphrey Rolls, within 1 to 2 weeks.   REASON FOR ADMISSION: Shortness of breath.   HOSPITAL COURSE: The patient is a 59 year old Caucasian female with a history of hypertension, diabetes, asthma, COPD, who presented to the ED with severe shortness of breath. The patient was unable to breathe. In the ED, the patient was unable to speak full sentences. Had some subjective fever at home. Chest x-ray did not show any active disease. The patient was treated with Solu-Medrol, magnesium, and Levaquin and admitted for acute respiratory failure with asthma exacerbation. Laboratory data on admission date was in normal range. Troponin was less than 0.02.  1.  Acute respiratory failure with asthma exacerbation. The patient was admitted to stepdown unit after putting on BiPAP. After admission, the patient has been treated with Solu-Medrol, nebulizer, Levaquin. The patient's symptoms have much improved. She only has mild cough today, but no wheezing or shortness of breath.  2.  Anxiety. The patient received Xanax p.r.n. and home medication, Cymbalta. 3.  Hypertension has been controlled.   The patient is clinically stable and will be discharged to home today. I discussed the patient's discharge plan with the patient, nurse, case manager.   TIME SPENT: About 37 minutes.   ____________________________ Demetrios Loll, MD qc:jcm D: 06/16/2013 15:22:39 ET T: 06/16/2013  16:32:14 ET JOB#: [id number]  cc: Demetrios Loll, MD, <Dictator> Demetrios Loll MD ELECTRONICALLY SIGNED 06/17/2013 14:17

## 2014-07-22 NOTE — Op Note (Signed)
PATIENT NAME:  Summer Murphy, Summer Murphy MR#:  [id number] DATE OF BIRTH:  1955-08-23  DATE OF PROCEDURE:  05/16/2014  PREOPERATIVE DIAGNOSES: Right distal ureteral stone, flank pain.  POSTOPERATIVE DIAGNOSIS:  Flank pain.   PROCEDURE PERFORMED: Right ureteroscopy, right ureteral stent placement.   SURGEON:  Sherlynn Stalls, MD  ANESTHESIA: General anesthesia.   ESTIMATED BLOOD LOSS: Minimal.   DRAINS: A 6 x 24 French double-J ureteral stent on right.   COMPLICATIONS: None.   INDICATION: This is a 59 year old female with acute onset right flank pain, found to have 3 mm right distal ureteral stone. The pain was poorly controlled and she had persistent nausea and vomiting. Therefore, she was counseled to undergo definitive management of her stone. Risks and benefits of the procedure were explained in detail. The patient agreed to proceed as planned.   PROCEDURE: The patient was correctly identified in the preoperative holding area and informed consent was confirmed. She was placed in a supine position and universal timeout protocol was performed. All team members were identified. Venodyne boots were placed and she was administered 1 gram of IV Ancef in the perioperative period. She was then placed under general anesthesia with endotracheal tube and repositioned in the dorsal lithotomy position. She was prepped and draped in standard surgical fashion. Attention was turned to the right distal ureteral orifice, which was cannulated just within the UO using a 5 [place] open-ended ureteral catheter. A Sensor wire was placed up to the level of the renal pelvis without difficulty and the open-ended ureteral catheter was removed. The wire was snapped in place with a safety wire. At this point in time, a 7 [place] short ureteroscope was advanced through the distal ureter orifice into the distal ureter. Within the intraluminal ureter, there was some mass effect noted from extrinsic compression but no stone was  identified. There was some concern that this represented intramural stone. The scope was able to be advanced all the way to the level of the mid ureter and there was no evidence of stones. The scope was slowly removed and the entire ureter was inspected under direct visualization. Again, there were no actual stones noted within the lumen of the ureter. Given her persistent flank pain I did decide to go ahead and place a double-J ureteral stent over the wire. A 6 x 24 French double-J ureteral stent was placed under fluoroscopic guidance as well as the renal pelvis. The wire was partially withdrawn and a coil was noted within the renal pelvis. The wire was completely withdrawn and a coil was noted within the bladder. The bladder was then drained. The patient was reversed from anesthesia after being repositioned in the supine position and taken to the PACU in stable condition. There were no complications.   ____________________________ Sherlynn Stalls, MD ajb:mc D: 05/16/2014 18:55:00 ET T: 05/17/2014 11:12:31 ET JOB#: [id number]  cc: Sherlynn Stalls, MD, <Dictator> Sherlynn Stalls MD ELECTRONICALLY SIGNED 05/30/2014 13:43

## 2014-07-22 NOTE — Discharge Summary (Signed)
PATIENT NAME:  Summer Murphy, Summer Murphy MR#:  [id number] DATE OF BIRTH:  12/26/1955  DATE OF ADMISSION:  05/15/2014 DATE OF DISCHARGE:  05/17/2014  PRIMARY CARE PHYSICIAN: Clayborn Bigness  UROLOGIST: Sherlynn Stalls, MD    FINAL DIAGNOSES:  1.  Nephrolithiasis, right hydroureter and hydronephrosis.  2.  Asthma.  3.  Restless leg syndrome.  4.  Depression.  5.  History of migraine.  6.  Essential hypertension.  7.  Neuropathy.  8.  Gastroesophageal reflux disease without esophagitis.   MEDICATIONS ON DISCHARGE: Include: Combivent Respimat 1 puff 4 times a day, amitriptyline 50 mg at bedtime, gabapentin 100 mg twice a day, levocetirizine 5 mg 1 tablet daily, Symbicort 160/4.5 two puffs twice a day, Requip 0.25 mg twice a day, Premarin 0.45 mg daily, omeprazole 20 mg twice a day, albuterol nebulizer every 6 hours as needed, Flomax 0.4 mg daily, diltiazem ER 180 mg daily, Cymbalta 60 mg daily, Singulair 10 mg in the evening, Spiriva 1 inhalation daily, Zofran ODT 4 mg every 8 hours as needed for nausea and vomiting, oxycodone 10 mg every 4 hours as needed for moderate pain, cephalexin 250 mg 3 times a day for 3 more days.   DIET: Regular, regular consistency. Stay hydrated.   ACTIVITY: As tolerated.   REFERRAL: To Dr. Hollice Espy, urology, as outpatient.   HOSPITAL COURSE: The patient was admitted as an observation 05/15/2014 and discharged 05/17/2014. Came in with severe pain in the right back and flank, found to have a 3 mm stone. The patient was given IV fluid hydration.   LABORATORY AND RADIOLOGICAL DATA UPON DISCHARGE: Hemoglobin 10, glucose 96, BUN 3, creatinine 0.31, sodium 134, potassium 4.2, chloride 104, CO2 of 23, calcium 8.6. CAT scan showed 3 mm stone in the distal right ureter, proximal hydronephrosis and hydroureter. Ultrasound of the kidney showed moderate right hydronephrosis and right hydroureter. Urine culture many organisms suggestive of contamination.   HOSPITAL COURSE PER  PROBLEM LIST:  1.  For the patient's nephrolithiasis and right hydroureter and hydronephrosis, the patient was seen in consultation by Dr. Hollice Espy because the patient's pain had not improved with gentle IV fluid hydration. This stone should have passed on its own. The patient did have a ureter stent by Dr. Erlene Quan on February 24. The patient did pass a kidney stone and it was saved cup in the evening of February 24. We sent it off to the lab for further analysis. Dr. Erlene Quan can look these results as outpatient for further recommendations because she does produce stones. I also advised her to stay hydrated at all times.  2.  For other medical issues, no changes in medications were given.  3.  The patient was given a small script for pain medication, nausea medication, finish up a course of antibiotic. The patient has Flomax at home already.   TIME SPENT ON DISCHARGE: 35 minutes.    ____________________________ Tana Conch. Leslye Peer, MD rjw:bm D: 05/17/2014 14:38:50 ET T: 05/18/2014 01:48:23 ET JOB#: [id number]  cc: Tana Conch. Leslye Peer, MD, <Dictator> Sherlynn Stalls, MD Primary Care Provider Marisue Brooklyn MD ELECTRONICALLY SIGNED 05/30/2014 10:44

## 2014-07-22 NOTE — Consult Note (Signed)
Chief Complaint:  Subjective/Chief Complaint passed small 3 mm stone overnight, stent in place.  pain has resolved but continues to have n/v.  also complains of HA this AM.   VITAL SIGNS/ANCILLARY NOTES: **Vital Signs.:   25-Feb-16 04:32  Vital Signs Type Routine  Temperature Temperature (F) 98.3  Celsius 36.8  Temperature Source oral  Pulse Pulse 81  Respirations Respirations 18  Systolic BP Systolic BP 688  Diastolic BP (mmHg) Diastolic BP (mmHg) 78  Mean BP 98  Pulse Ox % Pulse Ox % 93  Pulse Ox Activity Level  At rest  Oxygen Delivery Room Air/ 21 %  *Intake and Output.:   Daily 25-Feb-16 07:00  Grand Totals Intake:  4150 Output:  1080    Net:  3070 24 Hr.:  6484  IV (Primary)      In:  4150  Urine ml     Out:  1080  Length of Stay Totals Intake:  4150 Output:  1080    Net:  3070   Brief Assessment:  GEN well developed, well nourished, no acute distress   Cardiac no murmur  -- thrills  -- LE edema   Respiratory normal resp effort  no use of accessory muscles   Gastrointestinal Normal   Gastrointestinal details normal Soft  Nontender  Nondistended  No CVA tenderness bilaterally   EXTR negative cyanosis/clubbing   Lab Results: Routine Chem:  25-Feb-16 08:17   Glucose, Serum 96  BUN  3  Creatinine (comp)  0.31  Sodium, Serum  134  Potassium, Serum 4.2  Chloride, Serum 104  CO2, Serum 23  Calcium (Total), Serum 8.6  Anion Gap 7  Osmolality (calc) 265  eGFR (African American) >60  eGFR (Non-African American) >60 (eGFR values <76m/min/1.73 m2 may be an indication of chronic kidney disease (CKD). Calculated eGFR, using the MRDR Study equation, is useful in  patients with stable renal function. The eGFR calculation will not be reliable in acutely ill patients when serum creatinine is changing rapidly. It is not useful in patients on dialysis. The eGFR calculation may not be applicable to patients at the low and high extremes of body sizes,  pregnant women, and vegetarians.)   Assessment/Plan:  Assessment/Plan:  Assessment 59 yo F with recurrent nephrolithiasis with 3 mm right distal ureteral stone POD1 s/p right URS, stent placement.  Small stone passed overnight from bladder, sent for stone analysis.  Feeling better, pain impoved but continued n/v.   Plan 1. OK to discharge from GU perspective once tolerating PO 2.  discharge home with flomax 0.4 mg daily and ditropan 5 mg po tid prn for stent pain 3.  patient advised to f/u in 1-2 weeks for cysto/ stent removal   Electronic Signatures: BSherlynn Stalls(MD)  (Signed 25-Feb-16 09:10)  Authored: Chief Complaint, VITAL SIGNS/ANCILLARY NOTES, Brief Assessment, Lab Results, Assessment/Plan   Last Updated: 25-Feb-16 09:10 by BSherlynn Stalls(MD)

## 2014-07-22 NOTE — H&P (Signed)
PATIENT NAME:  Summer Murphy, Summer Murphy MR#:  [id number] DATE OF BIRTH:  [birth year]  DATE OF ADMISSION:  05/15/2014  DATE OF BIRTH: [place], [place].   CHIEF COMPLAINT:  I have a kidney stone.   HISTORY OF PRESENT ILLNESS:  A 59 year old female who today developed severe pain around 2:00-3:00 p.m., unrelenting, 10 out of 10 in intensity in the right back. She has a history of kidney stones and knew this was a kidney stone. In the ER she had a CT scan of the abdomen and pelvis that showed a 3 mm stone in the distal right ureter with moderate proximal obstruction, bilateral nonobstructing intrarenal stones. Hospitalist services were contacted for pain control.   PAST MEDICAL HISTORY: Kidney stones, asthma, low vitamin D, depression, migraine, restless leg syndrome.   PAST SURGICAL HISTORY: Right total knee replacement, left total knee replacement, gallbladder, appendix, Nissen fundoplication x 2, hysterectomy, and nose fracture.   ALLERGIES: ASPIRIN, CLINDAMYCIN, LIBRIUM, REGLAN, ZITHROMAX, AND VANILLA EXTRACT.   MEDICATIONS: Include albuterol nebulizer every 6 hours as needed, amitriptyline 50 mg at bedtime, Combivent Respimat 1 puff 4 times a day, diltiazem ER 180 mg extended-release daily, Cymbalta 60 mg daily, Flomax 0.4 mg as needed for kidney stone, gabapentin 100 mg twice a day, levocetirizine 5 mg 1 tablet daily, Singulair 10 mg at bedtime, omeprazole 20 mg daily, Percocet 5/325 one to 2 tablets every 6 hours as needed for pain, prednisone 50 mg started on February 19, Premarin 0.45 mg daily, Requip 0.25 mg twice a day, Spiriva 1 inhalation daily, Symbicort 160/4.5 two puffs twice a day, Zofran ODT 4 mg as needed for nausea and vomiting.   SOCIAL HISTORY: No smoking. No alcohol. No drug use. Lives in an apartment complex, is on disability.   FAMILY HISTORY: Father had angina. Mother died of a CVA.   REVIEW OF SYSTEMS:  CONSTITUTIONAL: Positive for sweats. No fever. No chills. Positive for weight  loss. No weakness or fatigue.  EYES: No blurry vision.  EARS, NOSE, MOUTH, AND THROAT: No hearing loss. No sore throat. No difficulty swallowing.  CARDIOVASCULAR: No chest pain. No palpitations.  RESPIRATORY: No shortness of breath. No cough. No sputum. No hemoptysis.  GASTROINTESTINAL: Positive for nausea and vomiting earlier today. Positive for back pain, some abdominal pain with palpation. No diarrhea. No constipation. No bright blood per rectum. No melena.  GENITOURINARY: No burning on urination.  MUSCULOSKELETAL: Positive for knee pain.  INTEGUMENT: No rashes or eruptions.  NEUROLOGIC: No fainting or blackouts.  PSYCHIATRIC: Positive for depression.  ENDOCRINE: No thyroid problems.  HEMATOLOGIC AND LYMPHATIC: No anemia.   PHYSICAL EXAMINATION:  VITAL SIGNS: Temperature 99.1, pulse 88, respirations 18, blood pressure 138/94, pulse oximetry 100% on room air.  GENERAL: No respiratory distress, looking uncomfortable in bed.  EYES: Conjunctivae and lids normal. Pupils equal, round, and reactive to light.  Extraocular muscles intact. No nystagmus.  EARS, NOSE, MOUTH, AND THROAT: Tympanic membranes, no erythema. Nasal mucosa, no erythema. Throat, no erythema, no exudate seen. Lips and gums, no lesions.  NECK: No JVD. No bruits. No lymphadenopathy. No thyromegaly. No thyroid nodules palpated.  RESPIRATORY:  Lungs clear to auscultation. No use of accessory muscles to breathe. No rhonchi, rales, or wheeze heard.  CARDIOVASCULAR: S1, S2 normal. No gallops, rubs, or murmurs heard. Carotid upstroke 2 + bilaterally. No bruits. Dorsalis pedis pulses 2 + bilaterally. No edema of the lower extremity.  ABDOMEN: Soft. Positive tenderness in the right lower quadrant. No organomegaly/splenomegaly. Normoactive bowel sounds. Positive  right CVA tenderness.  EXTREMITIES: No clubbing, edema, cyanosis.  SKIN: No rashes or ulcers seen.  NEUROLOGIC: Cranial nerves II through XII grossly intact. Deep tendon reflexes  1 + bilateral lower extremity.  PSYCHIATRIC: The patient is oriented to person, place, and time.   LABORATORY AND RADIOLOGICAL DATA: Glucose 124, BUN 13, creatinine 0.62, sodium 135, potassium 3.9, chloride 104, CO2 of 26, calcium 8.6. Liver function tests normal range. White blood cell count 13.3, H and H 12.4 and 39.8, platelet count of 321,000. Ultrasound of the kidney showed moderate right hydronephrosis and right hydroureter, no definite obstructive calculus was identified. Urinalysis, trace leukocyte esterase, negative for blood. CT scan of the abdomen and pelvis shows 3 mm stone in the distal right ureter at the ureterovesical junction, prominent hydronephrosis and hydroureter with stranding around the right kidney and ureteral, additional punctate size stones in both kidneys.   ASSESSMENT AND PLAN:  1.  Severe back pain, right nephrolithiasis with hydroureter and hydronephrosis. Looking back on CAT scan that she had back in 02/06/2014, this showed the same thing with a stone in the distal right ureter with proximal hydronephrosis and hydroureter, so I am not sure if this is a chronic finding or it happens acutely with her stones. I will give IV fluid hydration, Flomax IV, and p.o. pain medications. We will strain her urine. A 3 mm stone should pass on its own without any intervention. Will need a urology followup as outpatient. The patient did follow up with Dr. Jacqlyn Larsen while he was here in [place], but has not seen him since he left the area, will hopefully be able to follow up with [facility] as outpatient. If the patient is still in this much pain tomorrow may consider urology consultation as in the hospital.  2.  Asthma, respiratory status stable. Discontinue prednisone.  3.  Restless leg syndrome on Requip.  4.  Depression on Cymbalta.  5.  History of migraine.  6.  Hypertension on diltiazem.  7.  Neuropathy on gabapentin.  8.  Gastroesophageal reflux disease without esophagitis, on  PPI as outpatient. We will continue PPI here.  TIME SPENT ON ADMISSION: 55 minutes.   CODE STATUS: The patient is a full code.    ____________________________ Tana Conch. Leslye Peer, MD rjw:bu D: 05/15/2014 20:27:10 ET T: 05/15/2014 21:12:04 ET JOB#: [id number]  cc: Tana Conch. Leslye Peer, MD, <Dictator> Lavera Guise, MD Marisue Brooklyn MD ELECTRONICALLY SIGNED 05/30/2014 10:42

## 2014-07-22 NOTE — Consult Note (Signed)
PATIENT NAME:  Summer Murphy, Summer Murphy MR#:  [id number] DATE OF BIRTH:  Jun 15, 1955  DATE OF CONSULTATION:  05/16/2014  REFERRING PHYSICIAN:  Theodoro Grist, MD CONSULTING PHYSICIAN:  Sherlynn Stalls, MD, urology.  CHIEF COMPLAINT: Right ureteral stone.   HISTORY OF PRESENT ILLNESS: This is a 59 year old patient with recurrent nephrolithiasis who presented to the Emergency Room yesterday afternoon with severe right flank pain with associated nausea and vomiting. CT of abdomen and pelvis showed a 3 mm right distal ureteral stone with moderate hydroureteronephrosis. There is also bilateral nonobstructing renal stones. She was admitted for pain control.   Of note, she does have a history of recurrent stones and was formerly a patient of Dr. Jacqlyn Larsen from [facility] urology. She has not seen him in quite some time, but has had multiple stone procedures in the form of the ESWL on several occasions.   Of note, she did have a CT scan back in November 2015, which showed a similar-appearing right distal ureteral stone. It is unclear if this represents the same stone or a new stone.  PAST MEDICAL HISTORY: 1.  Kidney stones. 2.  Asthma.  3.  Low vitamin D.  4.  Depression.  5.  Migraine.  6.  Restless leg syndrome.   PAST SURGICAL HISTORY:  1.  Right total knee replacement.  2.  Left total hip replacement.  3.  Cholecystectomy.  4.  Appendectomy.  5.  Nissen fundoplication x 2.  6.  Hysterectomy.  7.  Nose fracture.  8.  ESWL times multiple.   ALLERGIES: ASPIRIN, CLINDAMYCIN, LIBRIUM, REGLAN, AZITHROMYCIN, AND VANILLA EXTRACT.   HOME MEDICATIONS: Please see admission H and P.   SOCIAL HISTORY: No history of alcohol, smoking, or illicit drug use.   FAMILY HISTORY: No family history of kidney stones.   REVIEW OF SYSTEMS: A 13-point review of systems was performed and is negative other than as per HPI. She does also complain of sweats, but no fevers. She had some weight loss and knee pain, but otherwise has  no other complaints.   PHYSICAL EXAMINATION:  VITAL SIGNS: Temperature maximum is 98.6, current is 97.7; pulse 90; blood pressure is 117/67; respirations 18; 92% on room air.  GENERAL: Mild distress, holding emesis basin, vomiting. Alert and oriented x 3.  HEENT: Moist mucous membranes. Extraocular movements intact.  NECK: Supple. No masses. Trachea is midline.  CHEST: No increased work of breathing or retractions. No accessory muscle use.  CARDIOVASCULAR: No lower extremity clubbing, cyanosis, or edema.  ABDOMEN: Soft. Mild right CVA tenderness and right abdominal tenderness in the right lower quadrant. No rebound or guarding. No rigidity. No distention.  SKIN: No rashes, lesions or bruising.  NEUROLOGIC: Cranial nerves grossly intact. No focal deficits.  PSYCHIATRIC: Normal affect. No depression.  LYMPHATIC: No axillary or inguinal lymph nodes palpable.   LABORATORY DATA: Creatinine 0.70, BUN 12. LFTs were within normal limits other than alkaline phosphatase which was 135. WBC was 13.3 on admission, which is come down to 10.7 today; hemoglobin is 11.3; hematocrit is 35.5; platelets 226,000. Urine culture shows no growth collected on 05/15/2014. Urinalysis is cloudy yellow urine, negative glucose, negative bilirubin,  specific gravity is 1.025, pH is 5.0, 36 red blood cells per high-powered field, 5 white blood cells per high-powered field, trace bacteria, 2 epithelial cells, mucus and calcium oxalate crystals are present.   IMAGING: CT of the abdomen and pelvis, was personally reviewed. This shows a 3 mm distal right ureteral stone with moderate proximal hydroureteronephrosis.  She also has some stranding around the right kidney and ureter and additional punctate stones, which are nonobstructing bilaterally.   ASSESSMENT AND PLAN: This is a 59 year old female with a 3 mm right distal ureteral stone. Given the size of the stone, it should pass on its own, but she is very uncomfortable, has  significant nausea and vomiting and its possible that the stone has been present since November 2015, as there was a 3 mm distal stone at that time on CT scan. As such, given the degree of her discomfort, I do think it is wise to go ahead and proceed with at least right ureteral stent placement. There is no sign of active infection and if possible we will go ahead and treat and remove the stone intraoperatively as well. We discussed in detail all the risks and benefits of the procedure and plan to proceed as planned.  1.  N.p.o. for procedure this afternoon.  2.  Consent obtained for right ureteral stent placement, possible right ureteroscopy, laser lithotripsy.  3.  The patient may be discharged postoperatively.  4.  I would like to see her in the office for stent removal in 1-2 weeks.  5.  She should be discharged home with pain medication as well as Ditropan 5 mg p.o. t.i.d. p.r.n. bladder spasms and Flomax 0.4 mg daily to help with ureteral spasms.   Thank you for allowing me to participate in the care of this patient. I look forward to seeing her in followup.    ____________________________ Sherlynn Stalls, MD ajb:bm D: 05/16/2014 17:59:54 ET T: 05/17/2014 04:37:25 ET JOB#: [id number]  cc: Sherlynn Stalls, MD, <Dictator> Sherlynn Stalls MD ELECTRONICALLY SIGNED 05/30/2014 13:43

## 2014-07-27 DIAGNOSIS — F331 Major depressive disorder, recurrent, moderate: Secondary | ICD-10-CM | POA: Diagnosis not present

## 2014-07-27 DIAGNOSIS — J449 Chronic obstructive pulmonary disease, unspecified: Secondary | ICD-10-CM | POA: Diagnosis not present

## 2014-07-27 DIAGNOSIS — F502 Bulimia nervosa: Secondary | ICD-10-CM | POA: Diagnosis not present

## 2014-07-27 DIAGNOSIS — G43909 Migraine, unspecified, not intractable, without status migrainosus: Secondary | ICD-10-CM | POA: Diagnosis not present

## 2014-07-27 DIAGNOSIS — R110 Nausea: Secondary | ICD-10-CM | POA: Diagnosis not present

## 2014-08-28 ENCOUNTER — Ambulatory Visit: Payer: Medicare Other | Admitting: Dietician

## 2014-08-28 ENCOUNTER — Emergency Department: Payer: Medicare Other

## 2014-08-28 ENCOUNTER — Other Ambulatory Visit: Payer: Self-pay

## 2014-08-28 ENCOUNTER — Encounter: Payer: Self-pay | Admitting: Emergency Medicine

## 2014-08-28 ENCOUNTER — Emergency Department
Admission: EM | Admit: 2014-08-28 | Discharge: 2014-08-28 | Disposition: A | Discharge status: Home / Self Care | Payer: Medicare Other | Attending: Emergency Medicine | Admitting: Emergency Medicine

## 2014-08-28 DIAGNOSIS — Z7952 Long term (current) use of systemic steroids: Secondary | ICD-10-CM | POA: Diagnosis not present

## 2014-08-28 DIAGNOSIS — Z79899 Other long term (current) drug therapy: Secondary | ICD-10-CM | POA: Diagnosis not present

## 2014-08-28 DIAGNOSIS — R51 Headache: Secondary | ICD-10-CM | POA: Diagnosis not present

## 2014-08-28 DIAGNOSIS — I10 Essential (primary) hypertension: Secondary | ICD-10-CM | POA: Insufficient documentation

## 2014-08-28 DIAGNOSIS — J45901 Unspecified asthma with (acute) exacerbation: Secondary | ICD-10-CM

## 2014-08-28 DIAGNOSIS — R0602 Shortness of breath: Secondary | ICD-10-CM | POA: Diagnosis not present

## 2014-08-28 DIAGNOSIS — J441 Chronic obstructive pulmonary disease with (acute) exacerbation: Secondary | ICD-10-CM | POA: Insufficient documentation

## 2014-08-28 DIAGNOSIS — F419 Anxiety disorder, unspecified: Secondary | ICD-10-CM | POA: Insufficient documentation

## 2014-08-28 DIAGNOSIS — J45909 Unspecified asthma, uncomplicated: Secondary | ICD-10-CM | POA: Diagnosis not present

## 2014-08-28 DIAGNOSIS — R0600 Dyspnea, unspecified: Secondary | ICD-10-CM | POA: Diagnosis present

## 2014-08-28 DIAGNOSIS — Z7951 Long term (current) use of inhaled steroids: Secondary | ICD-10-CM | POA: Insufficient documentation

## 2014-08-28 HISTORY — DX: Migraine, unspecified, not intractable, without status migrainosus: G43.909

## 2014-08-28 HISTORY — DX: Unspecified asthma, uncomplicated: J45.909

## 2014-08-28 HISTORY — DX: Essential (primary) hypertension: I10

## 2014-08-28 HISTORY — DX: Calculus of kidney: N20.0

## 2014-08-28 LAB — CBC
HCT: 39.9 % (ref 35.0–47.0)
Hemoglobin: 12.9 g/dL (ref 12.0–16.0)
MCH: 27.9 pg (ref 26.0–34.0)
MCHC: 32.3 g/dL (ref 32.0–36.0)
MCV: 86.3 fL (ref 80.0–100.0)
Platelets: 266 10*3/uL (ref 150–440)
RBC: 4.62 MIL/uL (ref 3.80–5.20)
RDW: 16.7 % — ABNORMAL HIGH (ref 11.5–14.5)
WBC: 8.1 10*3/uL (ref 3.6–11.0)

## 2014-08-28 LAB — BASIC METABOLIC PANEL
Anion gap: 12 (ref 5–15)
BUN: 11 mg/dL (ref 6–20)
CO2: 24 mmol/L (ref 22–32)
Calcium: 8.9 mg/dL (ref 8.9–10.3)
Chloride: 103 mmol/L (ref 101–111)
Creatinine, Ser: 0.75 mg/dL (ref 0.44–1.00)
GFR calc Af Amer: >60 mL/min (ref >60)
GFR calc non Af Amer: >60 mL/min (ref >60)
Glucose, Bld: 151 mg/dL — ABNORMAL HIGH (ref 65–99)
Potassium: 3.3 mmol/L — ABNORMAL LOW (ref 3.5–5.1)
Sodium: 139 mmol/L (ref 135–145)

## 2014-08-28 LAB — TROPONIN I
Troponin I: <0.03 ng/mL (ref <0.031)
Troponin I: <0.03 ng/mL (ref <0.031)

## 2014-08-28 MED ORDER — SODIUM CHLORIDE 0.9 % IV BOLUS (SEPSIS)
1000.0000 mL | Freq: Once | INTRAVENOUS | Status: AC
Start: 2014-08-28 — End: 2014-08-28
  Administered 2014-08-28: 1000 mL via INTRAVENOUS

## 2014-08-28 MED ORDER — IPRATROPIUM-ALBUTEROL 0.5-2.5 (3) MG/3ML IN SOLN
3.0000 mL | Freq: Once | RESPIRATORY_TRACT | Status: AC
  Administered 2014-08-28: 3 mL via RESPIRATORY_TRACT

## 2014-08-28 MED ORDER — METHYLPREDNISOLONE SODIUM SUCC 125 MG IJ SOLR
125.0000 mg | Freq: Once | INTRAMUSCULAR | Status: AC
  Administered 2014-08-28: 125 mg via INTRAVENOUS

## 2014-08-28 MED ORDER — ACETAMINOPHEN 325 MG PO TABS
ORAL_TABLET | ORAL | Status: AC
  Filled 2014-08-28: qty 2

## 2014-08-28 MED ORDER — ONDANSETRON 4 MG PO TBDP
4.0000 mg | ORAL_TABLET | Freq: Once | ORAL | Status: AC
  Administered 2014-08-28: 4 mg via ORAL

## 2014-08-28 MED ORDER — PREDNISONE 20 MG PO TABS: 60.0000 mg | ORAL_TABLET | Freq: Every day | ORAL | Status: DC

## 2014-08-28 MED ORDER — METHYLPREDNISOLONE SODIUM SUCC 125 MG IJ SOLR
INTRAMUSCULAR | Status: AC
  Administered 2014-08-28: 125 mg via INTRAVENOUS
  Filled 2014-08-28: qty 2

## 2014-08-28 MED ORDER — ACETAMINOPHEN 325 MG PO TABS
650.0000 mg | ORAL_TABLET | Freq: Once | ORAL | Status: AC
  Administered 2014-08-28: 650 mg via ORAL

## 2014-08-28 MED ORDER — ONDANSETRON 4 MG PO TBDP
ORAL_TABLET | ORAL | Status: AC
  Administered 2014-08-28: 4 mg via ORAL
  Filled 2014-08-28: qty 1

## 2014-08-28 MED ORDER — IPRATROPIUM-ALBUTEROL 0.5-2.5 (3) MG/3ML IN SOLN
RESPIRATORY_TRACT | Status: AC
  Filled 2014-08-28: qty 3

## 2014-08-28 MED ORDER — LORAZEPAM 1 MG PO TABS
ORAL_TABLET | ORAL | Status: AC
  Administered 2014-08-28: 1 mg via ORAL
  Filled 2014-08-28: qty 1

## 2014-08-28 MED ORDER — LORAZEPAM 1 MG PO TABS
1.0000 mg | ORAL_TABLET | Freq: Once | ORAL | Status: AC
  Administered 2014-08-28: 1 mg via ORAL

## 2014-08-28 MED ORDER — IPRATROPIUM-ALBUTEROL 0.5-2.5 (3) MG/3ML IN SOLN
RESPIRATORY_TRACT | Status: AC
  Administered 2014-08-28: 3 mL via RESPIRATORY_TRACT
  Filled 2014-08-28: qty 6

## 2014-08-28 NOTE — ED Provider Notes (Signed)
[facility] Emergency Department Provider Note  ____________________________________________  Time seen: Approximately 122 AM  I have reviewed the triage vital signs and the nursing notes.   HISTORY  Chief Complaint Asthma    HPI Summer Murphy is a 59 y.o. female with a history of asthma and COPD who comes in today with difficulty breathing. She reports that the symptoms started today. She used her Singulair and Combivent inhalers but reports that they did not help. The patient denies any cold symptoms or cough. She reports that she's had a lot of anxiety and is unsure if that has anything to do with the difficulty breathing. The patient has not had any sick contacts. She has had some chest tightness and lightheadedness. She also reports that she has a headache but she feels as though it's from the difficulty breathing. The patient denies any nausea vomiting abdominal pain or back pain. The patient has been seen with similar symptoms in the past. The patient is having some difficulty giving a full history due to her difficulty breathing.   Past Medical History  Diagnosis Date  . Asthma   . Hypertension   . Kidney stone   . Migraine    kidney stones  Depression  There are no active problems to display for this patient.   Past Surgical History  Procedure Laterality Date  . Appendectomy    . Cholecystectomy      Cholecystectomy Appendectomy Back surgery Nose surgery Esophagus surgery Knee surgery  Current Outpatient Rx  Name  Route  Sig  Dispense  Refill  . ALPRAZolam (XANAX) 0.5 MG tablet   Oral   Take 0.5 mg by mouth 2 (two) times daily as needed for anxiety.         Marland Kitchen amitriptyline (ELAVIL) 50 MG tablet   Oral   Take 50 mg by mouth at bedtime.         . budesonide-formoterol (SYMBICORT) 160-4.5 MCG/ACT inhaler   Inhalation   Inhale 2 puffs into the lungs 2 (two) times daily.         . calcium carbonate (TUMS - DOSED IN MG  ELEMENTAL CALCIUM) 500 MG chewable tablet   Oral   Chew 1 tablet by mouth daily.         . cholecalciferol (VITAMIN D) 1000 UNITS tablet   Oral   Take 1,000 Units by mouth daily.         Marland Kitchen diltiazem (DILACOR XR) 180 MG 24 hr capsule   Oral   Take 180 mg by mouth daily.         . DULoxetine (CYMBALTA) 30 MG capsule   Oral   Take 90 mg by mouth daily.         Marland Kitchen estrogens, conjugated, (PREMARIN) 0.45 MG tablet   Oral   Take 0.45 mg by mouth every evening. Take daily for 21 days then do not take for 7 days.         . fluticasone (FLONASE) 50 MCG/ACT nasal spray   Each Nare   Place 2 sprays into both nostrils daily.         Marland Kitchen gabapentin (NEURONTIN) 400 MG capsule   Oral   Take 400 mg by mouth 2 (two) times daily.         . Ipratropium-Albuterol (COMBIVENT) 20-100 MCG/ACT AERS respimat   Inhalation   Inhale 1 puff into the lungs 4 (four) times daily.         Marland Kitchen levocetirizine (XYZAL)  5 MG tablet   Oral   Take 5 mg by mouth daily.         . montelukast (SINGULAIR) 10 MG tablet   Oral   Take 10 mg by mouth at bedtime.         . Multiple Vitamin (MULTIVITAMIN WITH MINERALS) TABS tablet   Oral   Take 1 tablet by mouth daily.         Marland Kitchen omeprazole (PRILOSEC) 20 MG capsule   Oral   Take 20 mg by mouth 2 (two) times daily.         . rizatriptan (MAXALT) 5 MG tablet   Oral   Take 5 mg by mouth as needed for migraine. May repeat in 2 hours if needed         . rOPINIRole (REQUIP) 0.25 MG tablet   Oral   Take 0.25 mg by mouth 2 (two) times daily.         Marland Kitchen topiramate (TOPAMAX) 50 MG tablet   Oral   Take 50 mg by mouth at bedtime.         . traMADol (ULTRAM) 50 MG tablet   Oral   Take 50-100 mg by mouth every 6 (six) hours as needed for moderate pain or severe pain.         . valACYclovir (VALTREX) 500 MG tablet   Oral   Take 500 mg by mouth 2 (two) times daily. For 5 days when you have a fever blister.         . predniSONE (DELTASONE)  20 MG tablet   Oral   Take 3 tablets (60 mg total) by mouth daily.   12 tablet   0     Allergies Librium; Aspirin; Morphine and related; and Reglan  History reviewed. No pertinent family history.  Social History History  Substance Use Topics  . Smoking status: Never Smoker   . Smokeless tobacco: Not on file  . Alcohol Use: No    Review of Systems Constitutional: No fever/chills Eyes: No visual changes. ENT: No sore throat. Cardiovascular: Asked tightness Respiratory:  shortness of breath. Gastrointestinal: No abdominal pain.  No nausea, no vomiting.  No diarrhea.  No constipation. Genitourinary: Negative for dysuria. Musculoskeletal: Negative for back pain. Skin: Negative for rash. Neurological: Headache Psychiatric:Anxiety  10-point ROS otherwise negative.  ____________________________________________   PHYSICAL EXAM:  VITAL SIGNS: ED Triage Vitals  Enc Vitals Group     BP 08/28/14 0035 151/126 mmHg     Pulse Rate 08/28/14 0035 128     Resp 08/28/14 0057 20     Temp 08/28/14 0035 97.5 F (36.4 C)     Temp Source 08/28/14 0035 Oral     SpO2 08/28/14 0035 100 %     Weight 08/28/14 0035 152 lb (68.947 kg)     Height 08/28/14 0035 5\' 3"  (1.6 m)     Head Cir --      Peak Flow --      Pain Score 08/28/14 0039 0     Pain Loc --      Pain Edu? --      Excl. in [place]? --     Constitutional: Alert and oriented. Patient in moderate distress Eyes: Conjunctivae are normal. PERRL. EOMI. Head: Atraumatic. Nose: No congestion/rhinnorhea. Mouth/Throat: Mucous membranes are moist.  Oropharynx non-erythematous. Cardiovascular: Normal rate, regular rhythm. Grossly normal heart sounds.  Good peripheral circulation. Respiratory: Increased respiratory effort with tachypnea and tight expiratory wheezes, decreased air movement Gastrointestinal:  Soft and nontender. No distention. Active bowel sounds Genitourinary: Deferred Musculoskeletal: No lower extremity tenderness nor  edema.   Neurologic:   No gross focal neurologic deficits are appreciated.  Skin:  Skin is warm, dry and intact. No rash noted. Psychiatric: Mood and affect are normal.  ____________________________________________   LABS (all labs ordered are listed, but only abnormal results are displayed)  Labs Reviewed  CBC - Abnormal; Notable for the following:    RDW 16.7 (*)    All other components within normal limits  BASIC METABOLIC PANEL - Abnormal; Notable for the following:    Potassium 3.3 (*)    Glucose, Bld 151 (*)    All other components within normal limits  TROPONIN I  TROPONIN I   ____________________________________________  EKG  ED ECG REPORT I, Loney Hering, the attending physician, personally viewed and interpreted this ECG.   Date: 08/28/2014  EKG Time: 214  Rate: 97  Rhythm: unchanged from previous tracings, LBBB  Axis: Left  Intervals:left bundle branch block  ST&T Change: Left bundle branch block unchanged from previous.  ____________________________________________  RADIOLOGY  Chest x-ray: No active cardiopulmonary disease, moderate sized hiatal hernia, no active infiltrates or atelectasis. ____________________________________________   PROCEDURES  Procedure(s) performed: None  Critical Care performed: No  ____________________________________________   INITIAL IMPRESSION / ASSESSMENT AND PLAN / ED COURSE  Pertinent labs & imaging results that were available during my care of the patient were reviewed by me and considered in my medical decision making (see chart for details).  This is a 59 year old female who comes in today with difficulty breathing. Patient does have a history of asthma but we will do an EKG and some blood work as well as a chest x-ray. The patient received 1 DuoNeb in triage but we'll give the patient 2 more DuoNeb's as well as Solu-Medrol and reassess the patient after she received these  medications.  ----------------------------------------- 6:10 AM on 08/28/2014 -----------------------------------------  After receiving 3 duo nebs as well as Solu-Medrol the patient's breathing is significantly improved. I did monitor the patient for some time in the emergency department and her symptoms did not return. I will discharge the patient home with some steroids. She reports though that she does sometimes get jittery with steroids. I informed her that are probably be the best thing for her bronchospasm. She also did receive a dose of Ativan 1 mg by mouth 1 for anxiety. I will discharge the patient to follow-up with her pulmonologist. ____________________________________________   FINAL CLINICAL IMPRESSION(S) / ED DIAGNOSES  Final diagnoses:  Asthma exacerbation      Loney Hering, MD 08/28/14 [phone]

## 2014-08-28 NOTE — Discharge Instructions (Signed)
Asthma, Acute Bronchospasm °Acute bronchospasm caused by asthma is also referred to as an asthma attack. Bronchospasm means your air passages become narrowed. The narrowing is caused by inflammation and tightening of the muscles in the air tubes (bronchi) in your lungs. This can make it hard to breathe or cause you to wheeze and cough. °CAUSES °Possible triggers are: °· Animal dander from the skin, hair, or feathers of animals. °· Dust mites contained in house dust. °· Cockroaches. °· Pollen from trees or grass. °· Mold. °· Cigarette or tobacco smoke. °· Air pollutants such as dust, household cleaners, hair sprays, aerosol sprays, paint fumes, strong chemicals, or strong odors. °· Cold air or weather changes. Cold air may trigger inflammation. Winds increase molds and pollens in the air. °· Strong emotions such as crying or laughing hard. °· Stress. °· Certain medicines such as aspirin or beta-blockers. °· Sulfites in foods and drinks, such as dried fruits and wine. °· Infections or inflammatory conditions, such as a flu, cold, or inflammation of the nasal membranes (rhinitis). °· Gastroesophageal reflux disease (GERD). GERD is a condition where stomach acid backs up into your esophagus. °· Exercise or strenuous activity. °SIGNS AND SYMPTOMS  °· Wheezing. °· Excessive coughing, particularly at night. °· Chest tightness. °· Shortness of breath. °DIAGNOSIS  °Your health care provider will ask you about your medical history and perform a physical exam. A chest X-ray or blood testing may be performed to look for other causes of your symptoms or other conditions that may have triggered your asthma attack.  °TREATMENT  °Treatment is aimed at reducing inflammation and opening up the airways in your lungs.  Most asthma attacks are treated with inhaled medicines. These include quick relief or rescue medicines (such as bronchodilators) and controller medicines (such as inhaled corticosteroids). These medicines are sometimes  given through an inhaler or a nebulizer. Systemic steroid medicine taken by mouth or given through an IV tube also can be used to reduce the inflammation when an attack is moderate or severe. Antibiotic medicines are only used if a bacterial infection is present.  °HOME CARE INSTRUCTIONS  °· Rest. °· Drink plenty of liquids. This helps the mucus to remain thin and be easily coughed up. Only use caffeine in moderation and do not use alcohol until you have recovered from your illness. °· Do not smoke. Avoid being exposed to secondhand smoke. °· You play a critical role in keeping yourself in good health. Avoid exposure to things that cause you to wheeze or to have breathing problems. °· Keep your medicines up-to-date and available. Carefully follow your health care provider's treatment plan. °· Take your medicine exactly as prescribed. °· When pollen or pollution is bad, keep windows closed and use an air conditioner or go to places with air conditioning. °· Asthma requires careful medical care. See your health care provider for a follow-up as advised. If you are more than [redacted] weeks pregnant and you were prescribed any new medicines, let your obstetrician know about the visit and how you are doing. Follow up with your health care provider as directed. °· After you have recovered from your asthma attack, make an appointment with your outpatient doctor to talk about ways to reduce the likelihood of future attacks. If you do not have a doctor who manages your asthma, make an appointment with a primary care doctor to discuss your asthma. °SEEK IMMEDIATE MEDICAL CARE IF:  °· You are getting worse. °· You have trouble breathing. If severe, call your local   emergency services (911 in the [place]  You develop chest pain or discomfort.  You are vomiting.  You are not able to keep fluids down.  You are coughing up yellow, green, brown, or bloody sputum.  You have a fever and your symptoms suddenly get worse.  You have  trouble swallowing. MAKE SURE YOU:   Understand these instructions.  Will watch your condition.  Will get help right away if you are not doing well or get worse. Document Released: 06/24/2006 Document Revised: 03/14/2013 Document Reviewed: 09/14/2012 [facility] Patient Information 2015 [place], [place]. This information is not intended to replace advice given to you by your health care provider. Make sure you discuss any questions you have with your health care provider.  Asthma Asthma is a recurring condition in which the airways tighten and narrow. Asthma can make it difficult to breathe. It can cause coughing, wheezing, and shortness of breath. Asthma episodes, also called asthma attacks, range from minor to life-threatening. Asthma cannot be cured, but medicines and lifestyle changes can help control it. CAUSES Asthma is believed to be caused by inherited (genetic) and environmental factors, but its exact cause is unknown. Asthma may be triggered by allergens, lung infections, or irritants in the air. Asthma triggers are different for each person. Common triggers include:   Animal dander.  Dust mites.  Cockroaches.  Pollen from trees or grass.  Mold.  Smoke.  Air pollutants such as dust, household cleaners, hair sprays, aerosol sprays, paint fumes, strong chemicals, or strong odors.  Cold air, weather changes, and winds (which increase molds and pollens in the air).  Strong emotional expressions such as crying or laughing hard.  Stress.  Certain medicines (such as aspirin) or types of drugs (such as beta-blockers).  Sulfites in foods and drinks. Foods and drinks that may contain sulfites include dried fruit, potato chips, and sparkling grape juice.  Infections or inflammatory conditions such as the flu, a cold, or an inflammation of the nasal membranes (rhinitis).  Gastroesophageal reflux disease (GERD).  Exercise or strenuous activity. SYMPTOMS Symptoms may occur  immediately after asthma is triggered or many hours later. Symptoms include:  Wheezing.  Excessive nighttime or early morning coughing.  Frequent or severe coughing with a common cold.  Chest tightness.  Shortness of breath. DIAGNOSIS  The diagnosis of asthma is made by a review of your medical history and a physical exam. Tests may also be performed. These may include:  Lung function studies. These tests show how much air you breathe in and out.  Allergy tests.  Imaging tests such as X-rays. TREATMENT  Asthma cannot be cured, but it can usually be controlled. Treatment involves identifying and avoiding your asthma triggers. It also involves medicines. There are 2 classes of medicine used for asthma treatment:   Controller medicines. These prevent asthma symptoms from occurring. They are usually taken every day.  Reliever or rescue medicines. These quickly relieve asthma symptoms. They are used as needed and provide short-term relief. Your health care provider will help you create an asthma action plan. An asthma action plan is a written plan for managing and treating your asthma attacks. It includes a list of your asthma triggers and how they may be avoided. It also includes information on when medicines should be taken and when their dosage should be changed. An action plan may also involve the use of a device called a peak flow meter. A peak flow meter measures how well the lungs are working. It helps you  monitor your condition. HOME CARE INSTRUCTIONS   Take medicines only as directed by your health care provider. Speak with your health care provider if you have questions about how or when to take the medicines.  Use a peak flow meter as directed by your health care provider. Record and keep track of readings.  Understand and use the action plan to help minimize or stop an asthma attack without needing to seek medical care.  Control your home environment in the following ways to  help prevent asthma attacks:  Do not smoke. Avoid being exposed to secondhand smoke.  Change your heating and air conditioning filter regularly.  Limit your use of fireplaces and wood stoves.  Get rid of pests (such as roaches and mice) and their droppings.  Throw away plants if you see mold on them.  Clean your floors and dust regularly. Use unscented cleaning products.  Try to have someone else vacuum for you regularly. Stay out of rooms while they are being vacuumed and for a short while afterward. If you vacuum, use a dust mask from a hardware store, a double-layered or microfilter vacuum cleaner bag, or a vacuum cleaner with a HEPA filter.  Replace carpet with wood, tile, or vinyl flooring. Carpet can trap dander and dust.  Use allergy-proof pillows, mattress covers, and box spring covers.  Wash bed sheets and blankets every week in hot water and dry them in a dryer.  Use blankets that are made of polyester or cotton.  Clean bathrooms and kitchens with bleach. If possible, have someone repaint the walls in these rooms with mold-resistant paint. Keep out of the rooms that are being cleaned and painted.  Wash hands frequently. SEEK MEDICAL CARE IF:   You have wheezing, shortness of breath, or a cough even if taking medicine to prevent attacks.  The colored mucus you cough up (sputum) is thicker than usual.  Your sputum changes from clear or white to yellow, green, gray, or bloody.  You have any problems that may be related to the medicines you are taking (such as a rash, itching, swelling, or trouble breathing).  You are using a reliever medicine more than 2-3 times per week.  Your peak flow is still at 50-79% of your personal best after following your action plan for 1 hour.  You have a fever. SEEK IMMEDIATE MEDICAL CARE IF:   You seem to be getting worse and are unresponsive to treatment during an asthma attack.  You are short of breath even at rest.  You get  short of breath when doing very little physical activity.  You have difficulty eating, drinking, or talking due to asthma symptoms.  You develop chest pain.  You develop a fast heartbeat.  You have a bluish color to your lips or fingernails.  You are light-headed, dizzy, or faint.  Your peak flow is less than 50% of your personal best. MAKE SURE YOU:   Understand these instructions.  Will watch your condition.  Will get help right away if you are not doing well or get worse. Document Released: 03/09/2005 Document Revised: 07/24/2013 Document Reviewed: 10/06/2012 [facility] Patient Information 2015 [place], [place]. This information is not intended to replace advice given to you by your health care provider. Make sure you discuss any questions you have with your health care provider.

## 2014-08-28 NOTE — ED Notes (Signed)
Pt arrived to the ED complaining of shortness of breath due to an asthma attack. Pt's breathing is labored with audible wheezing and speaking in broken sentences. Pt has a hx of asthma and ANXIEY. Pt isAOx4 in moderate respiratory distress. MD notified.

## 2014-08-30 DIAGNOSIS — D511 Vitamin B12 deficiency anemia due to selective vitamin B12 malabsorption with proteinuria: Secondary | ICD-10-CM | POA: Diagnosis not present

## 2014-08-30 DIAGNOSIS — T426X5A Adverse effect of other antiepileptic and sedative-hypnotic drugs, initial encounter: Secondary | ICD-10-CM | POA: Diagnosis not present

## 2014-08-30 DIAGNOSIS — I10 Essential (primary) hypertension: Secondary | ICD-10-CM | POA: Diagnosis not present

## 2014-08-30 DIAGNOSIS — F331 Major depressive disorder, recurrent, moderate: Secondary | ICD-10-CM | POA: Diagnosis not present

## 2014-09-10 ENCOUNTER — Ambulatory Visit: Payer: Medicare Other | Admitting: Dietician

## 2014-09-10 ENCOUNTER — Emergency Department
Admission: EM | Admit: 2014-09-10 | Discharge: 2014-09-10 | Disposition: A | Discharge status: Home / Self Care | Payer: Medicare Other | Attending: Emergency Medicine | Admitting: Emergency Medicine

## 2014-09-10 DIAGNOSIS — J449 Chronic obstructive pulmonary disease, unspecified: Secondary | ICD-10-CM

## 2014-09-10 DIAGNOSIS — Z7952 Long term (current) use of systemic steroids: Secondary | ICD-10-CM | POA: Diagnosis not present

## 2014-09-10 DIAGNOSIS — Z79899 Other long term (current) drug therapy: Secondary | ICD-10-CM | POA: Insufficient documentation

## 2014-09-10 DIAGNOSIS — F419 Anxiety disorder, unspecified: Secondary | ICD-10-CM | POA: Diagnosis present

## 2014-09-10 DIAGNOSIS — I10 Essential (primary) hypertension: Secondary | ICD-10-CM | POA: Insufficient documentation

## 2014-09-10 DIAGNOSIS — Z7951 Long term (current) use of inhaled steroids: Secondary | ICD-10-CM | POA: Diagnosis not present

## 2014-09-10 DIAGNOSIS — R45851 Suicidal ideations: Secondary | ICD-10-CM | POA: Diagnosis not present

## 2014-09-10 DIAGNOSIS — Z791 Long term (current) use of non-steroidal anti-inflammatories (NSAID): Secondary | ICD-10-CM | POA: Insufficient documentation

## 2014-09-10 DIAGNOSIS — F322 Major depressive disorder, single episode, severe without psychotic features: Secondary | ICD-10-CM

## 2014-09-10 DIAGNOSIS — F332 Major depressive disorder, recurrent severe without psychotic features: Secondary | ICD-10-CM

## 2014-09-10 HISTORY — DX: Anxiety disorder, unspecified: F41.9

## 2014-09-10 LAB — COMPREHENSIVE METABOLIC PANEL
ALT: 20 U/L (ref 14–54)
AST: 22 U/L (ref 15–41)
Albumin: 4 g/dL (ref 3.5–5.0)
Alkaline Phosphatase: 104 U/L (ref 38–126)
Anion gap: 6 (ref 5–15)
BUN: 10 mg/dL (ref 6–20)
CO2: 30 mmol/L (ref 22–32)
Calcium: 9.7 mg/dL (ref 8.9–10.3)
Chloride: 101 mmol/L (ref 101–111)
Creatinine, Ser: 0.59 mg/dL (ref 0.44–1.00)
GFR calc Af Amer: >60 mL/min (ref >60)
GFR calc non Af Amer: >60 mL/min (ref >60)
Glucose, Bld: 87 mg/dL (ref 65–99)
Potassium: 4.2 mmol/L (ref 3.5–5.1)
Sodium: 137 mmol/L (ref 135–145)
Total Bilirubin: 0.2 mg/dL — ABNORMAL LOW (ref 0.3–1.2)
Total Protein: 6.8 g/dL (ref 6.5–8.1)

## 2014-09-10 LAB — URINALYSIS COMPLETE WITH MICROSCOPIC (ARMC ONLY)
Bacteria, UA: NONE SEEN
Bilirubin Urine: NEGATIVE
Glucose, UA: NEGATIVE mg/dL
Hgb urine dipstick: NEGATIVE
Ketones, ur: NEGATIVE mg/dL
Nitrite: NEGATIVE
Protein, ur: NEGATIVE mg/dL
Specific Gravity, Urine: 1.003 — ABNORMAL LOW (ref 1.005–1.030)
pH: 5 (ref 5.0–8.0)

## 2014-09-10 LAB — URINE DRUG SCREEN, QUALITATIVE (ARMC ONLY)
Amphetamines, Ur Screen: NOT DETECTED
Barbiturates, Ur Screen: NOT DETECTED
Benzodiazepine, Ur Scrn: POSITIVE — AB
Cannabinoid 50 Ng, Ur ~~LOC~~: NOT DETECTED
Cocaine Metabolite,Ur ~~LOC~~: NOT DETECTED
MDMA (Ecstasy)Ur Screen: NOT DETECTED
Methadone Scn, Ur: NOT DETECTED
Opiate, Ur Screen: NOT DETECTED
Phencyclidine (PCP) Ur S: NOT DETECTED
Tricyclic, Ur Screen: POSITIVE — AB

## 2014-09-10 LAB — ETHANOL: Alcohol, Ethyl (B): <5 mg/dL (ref <5)

## 2014-09-10 LAB — SALICYLATE LEVEL: Salicylate Lvl: <4 mg/dL (ref 2.8–30.0)

## 2014-09-10 LAB — ACETAMINOPHEN LEVEL: Acetaminophen (Tylenol), Serum: <10 ug/mL — ABNORMAL LOW (ref 10–30)

## 2014-09-10 NOTE — ED Notes (Signed)
Pt calm, positive, states has friends at home helping her get caught up and for support. Alert, oriented. DC to home.

## 2014-09-10 NOTE — ED Notes (Signed)
BEHAVIORAL HEALTH ROUNDING Patient sleeping: No. Patient alert and oriented: yes Behavior appropriate: Yes.  ; If no, describe:  Nutrition and fluids offered: Yes  Toileting and hygiene offered: Yes  Sitter present: not applicable Law enforcement present: Yes  

## 2014-09-10 NOTE — ED Notes (Signed)
IV[facility]  PAPERS  RESCINDED  PER  DR  CLAPACS 

## 2014-09-10 NOTE — ED Notes (Signed)
PT  IV[facility]  PER  DR  STAFFORD  ALL PAPERWORK  ON  CHART.  [facility]  PAPERWORK FAXED TO  [facility]

## 2014-09-10 NOTE — Discharge Instructions (Signed)
Major Depressive Disorder °Major depressive disorder is a mental illness. It also may be called clinical depression or unipolar depression. Major depressive disorder usually causes feelings of sadness, hopelessness, or helplessness. Some people with this disorder do not feel particularly sad but lose interest in doing things they used to enjoy (anhedonia). Major depressive disorder also can cause physical symptoms. It can interfere with work, school, relationships, and other normal everyday activities. The disorder varies in severity but is longer lasting and more serious than the sadness we all feel from time to time in our lives. °Major depressive disorder often is triggered by stressful life events or major life changes. Examples of these triggers include divorce, loss of your job or home, a move, and the death of a family member or close friend. Sometimes this disorder occurs for no obvious reason at all. People who have family members with major depressive disorder or bipolar disorder are at higher risk for developing this disorder, with or without life stressors. Major depressive disorder can occur at any age. It may occur just once in your life (single episode major depressive disorder). It may occur multiple times (recurrent major depressive disorder). °SYMPTOMS °People with major depressive disorder have either anhedonia or depressed mood on nearly a daily basis for at least 2 weeks or longer. Symptoms of depressed mood include: °· Feelings of sadness (blue or down in the dumps) or emptiness. °· Feelings of hopelessness or helplessness. °· Tearfulness or episodes of crying (may be observed by others). °· Irritability (children and adolescents). °In addition to depressed mood or anhedonia or both, people with this disorder have at least four of the following symptoms: °· Difficulty sleeping or sleeping too much.   °· Significant change (increase or decrease) in appetite or weight.   °· Lack of energy or  motivation. °· Feelings of guilt and worthlessness.   °· Difficulty concentrating, remembering, or making decisions. °· Unusually slow movement (psychomotor retardation) or restlessness (as observed by others).   °· Recurrent wishes for death, recurrent thoughts of self-harm (suicide), or a suicide attempt. °People with major depressive disorder commonly have persistent negative thoughts about themselves, other people, and the world. People with severe major depressive disorder may experience distorted beliefs or perceptions about the world (psychotic delusions). They also may see or hear things that are not real (psychotic hallucinations). °DIAGNOSIS °Major depressive disorder is diagnosed through an assessment by your health care provider. Your health care provider will ask about aspects of your daily life, such as mood, sleep, and appetite, to see if you have the diagnostic symptoms of major depressive disorder. Your health care provider may ask about your medical history and use of alcohol or drugs, including prescription medicines. Your health care provider also may do a physical exam and blood work. This is because certain medical conditions and the use of certain substances can cause major depressive disorder-like symptoms (secondary depression). Your health care provider also may refer you to a mental health specialist for further evaluation and treatment. °TREATMENT °It is important to recognize the symptoms of major depressive disorder and seek treatment. The following treatments can be prescribed for this disorder:   °· Medicine. Antidepressant medicines usually are prescribed. Antidepressant medicines are thought to correct chemical imbalances in the brain that are commonly associated with major depressive disorder. Other types of medicine may be added if the symptoms do not respond to antidepressant medicines alone or if psychotic delusions or hallucinations occur. °· Talk therapy. Talk therapy can be  helpful in treating major depressive disorder by providing   support, education, and guidance. Certain types of talk therapy also can help with negative thinking (cognitive behavioral therapy) and with relationship issues that trigger this disorder (interpersonal therapy). °A mental health specialist can help determine which treatment is best for you. Most people with major depressive disorder do well with a combination of medicine and talk therapy. Treatments involving electrical stimulation of the brain can be used in situations with extremely severe symptoms or when medicine and talk therapy do not work over time. These treatments include electroconvulsive therapy, transcranial magnetic stimulation, and vagal nerve stimulation. °Document Released: 07/04/2012 Document Revised: 07/24/2013 Document Reviewed: 07/04/2012 °[employer]® Patient Information ©2015 [employer]. This information is not intended to replace advice given to you by your health care provider. Make sure you discuss any questions you have with your health care provider. ° °
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
## 2014-09-10 NOTE — ED Notes (Signed)
BEHAVIORAL HEALTH ROUNDING Patient sleeping: Yes.   Patient alert and oriented: yes Behavior appropriate: Yes.  ; If no, describe:  Nutrition and fluids offered: Yes  Toileting and hygiene offered: Yes  Sitter present: no Law enforcement present: Yes  

## 2014-09-10 NOTE — ED Provider Notes (Signed)
[facility] Emergency Department Provider Note  ____________________________________________  Time seen: 1:35 PM  I have reviewed the triage vital signs and the nursing notes.   HISTORY  Chief Complaint Anxiety    HPI Summer Murphy is a 59 y.o. female who is sent from [place] behavioral services due to suicidal ideation. The patient reports multiple severe stressors at home recently, which she declines to elaborate on, resulting in feeling suicidal. She has been sleeping with a knife under her pillow for the last 2 days, and on direct questioning reports that she is hoping she will use the knife to kill herself. She is tearful and very upset but wants help. She feels safe here in the ER.     Past Medical History  Diagnosis Date  . Asthma   . Hypertension   . Kidney stone   . Migraine   . Anxiety     There are no active problems to display for this patient.   Past Surgical History  Procedure Laterality Date  . Appendectomy    . Cholecystectomy      Current Outpatient Rx  Name  Route  Sig  Dispense  Refill  . ALPRAZolam (XANAX) 0.5 MG tablet   Oral   Take 0.5 mg by mouth 2 (two) times daily as needed for anxiety.         Marland Kitchen amitriptyline (ELAVIL) 50 MG tablet   Oral   Take 50 mg by mouth at bedtime.         . budesonide-formoterol (SYMBICORT) 160-4.5 MCG/ACT inhaler   Inhalation   Inhale 2 puffs into the lungs 2 (two) times daily.         . calcium carbonate (TUMS - DOSED IN MG ELEMENTAL CALCIUM) 500 MG chewable tablet   Oral   Chew 1 tablet by mouth daily.         . cholecalciferol (VITAMIN D) 1000 UNITS tablet   Oral   Take 1,000 Units by mouth daily.         Marland Kitchen diltiazem (DILACOR XR) 180 MG 24 hr capsule   Oral   Take 180 mg by mouth daily.         . DULoxetine (CYMBALTA) 30 MG capsule   Oral   Take 90 mg by mouth daily.         Marland Kitchen estrogens, conjugated, (PREMARIN) 0.45 MG tablet   Oral   Take 0.45 mg by  mouth every evening. Take daily for 21 days then do not take for 7 days.         . fluticasone (FLONASE) 50 MCG/ACT nasal spray   Each Nare   Place 2 sprays into both nostrils daily.         Marland Kitchen gabapentin (NEURONTIN) 400 MG capsule   Oral   Take 400 mg by mouth 2 (two) times daily.         . Ipratropium-Albuterol (COMBIVENT) 20-100 MCG/ACT AERS respimat   Inhalation   Inhale 1 puff into the lungs 4 (four) times daily.         Marland Kitchen levocetirizine (XYZAL) 5 MG tablet   Oral   Take 5 mg by mouth daily.         . montelukast (SINGULAIR) 10 MG tablet   Oral   Take 10 mg by mouth at bedtime.         . Multiple Vitamin (MULTIVITAMIN WITH MINERALS) TABS tablet   Oral   Take 1 tablet by mouth daily.         Marland Kitchen  omeprazole (PRILOSEC) 20 MG capsule   Oral   Take 20 mg by mouth 2 (two) times daily.         . predniSONE (DELTASONE) 20 MG tablet   Oral   Take 3 tablets (60 mg total) by mouth daily.   12 tablet   0   . rizatriptan (MAXALT) 5 MG tablet   Oral   Take 5 mg by mouth as needed for migraine. May repeat in 2 hours if needed         . rOPINIRole (REQUIP) 0.25 MG tablet   Oral   Take 0.25 mg by mouth 2 (two) times daily.         Marland Kitchen topiramate (TOPAMAX) 50 MG tablet   Oral   Take 50 mg by mouth at bedtime.         . traMADol (ULTRAM) 50 MG tablet   Oral   Take 50-100 mg by mouth every 6 (six) hours as needed for moderate pain or severe pain.         . valACYclovir (VALTREX) 500 MG tablet   Oral   Take 500 mg by mouth 2 (two) times daily. For 5 days when you have a fever blister.           Allergies Librium; Aspirin; Morphine and related; and Reglan  No family history on file.  Social History History  Substance Use Topics  . Smoking status: Never Smoker   . Smokeless tobacco: Not on file  . Alcohol Use: No    Review of Systems  Constitutional: No fever or chills. No weight changes Eyes:No blurry vision or double vision.  ENT: No  sore throat. Cardiovascular: No chest pain. Respiratory: Mild dyspnea, feels like asthma, related to anxiety.. Gastrointestinal: Negative for abdominal pain, vomiting and diarrhea.  No BRBPR or melena. Genitourinary: Negative for dysuria, urinary retention, bloody urine, or difficulty urinating. Musculoskeletal: Negative for back pain. No joint swelling or pain. Skin: Negative for rash. Neurological: Negative for headaches, focal weakness or numbness. Psychiatric:Anxiety and depression.   Endocrine:No hot/cold intolerance, changes in energy, or sleep difficulty.  10-point ROS otherwise negative.  ____________________________________________   PHYSICAL EXAM:  VITAL SIGNS: ED Triage Vitals  Enc Vitals Group     BP 09/10/14 1301 117/70 mmHg     Pulse Rate 09/10/14 1301 87     Resp 09/10/14 1301 20     Temp 09/10/14 1307 98.1 F (36.7 C)     Temp Source 09/10/14 1307 Oral     SpO2 09/10/14 1301 100 %     Weight 09/10/14 1301 153 lb (69.4 kg)     Height 09/10/14 1301 5\' 3"  (1.6 m)     Head Cir --      Peak Flow --      Pain Score --      Pain Loc --      Pain Edu? --      Excl. in [place]? --      Constitutional: Alert and oriented. Anxious and tearful. Eyes: No scleral icterus. No conjunctival pallor. PERRL. EOMI ENT   Head: Normocephalic and atraumatic.   Nose: No congestion/rhinnorhea. No septal hematoma   Mouth/Throat: MMM, no pharyngeal erythema. No peritonsillar mass. No uvula shift.   Neck: No stridor. No SubQ emphysema. No meningismus. Hematological/Lymphatic/Immunilogical: No cervical lymphadenopathy. Cardiovascular: RRR. Normal and symmetric distal pulses are present in all extremities. No murmurs, rubs, or gallops. Respiratory: Normal respiratory effort without tachypnea nor retractions. Slight end expiratory wheeze with  good air entry throughout. Normal expiratory phase  Gastrointestinal: Soft and nontender. No distention. There is no CVA tenderness.   No rebound, rigidity, or guarding. Genitourinary: deferred Musculoskeletal: Nontender with normal range of motion in all extremities. No joint effusions.  No lower extremity tenderness.  No edema. Neurologic:   Normal speech and language.  CN 2-10 normal. Motor grossly intact. No pronator drift.  Normal gait. No gross focal neurologic deficits are appreciated.  Skin:  Skin is warm, dry and intact. No rash noted.  No petechiae, purpura, or bullae. Psychiatric: D depressed affect, crying, anxious. Reports suicidal ideation with plan to kill herself with a knife.  ____________________________________________    LABS (pertinent positives/negatives) (all labs ordered are listed, but only abnormal results are displayed) Labs Reviewed  URINALYSIS COMPLETEWITH MICROSCOPIC ([place]) - Abnormal; Notable for the following:    Color, Urine STRAW (*)    APPearance HAZY (*)    Specific Gravity, Urine 1.003 (*)    Leukocytes, UA TRACE (*)    Squamous Epithelial / LPF 6-30 (*)    All other components within normal limits  ACETAMINOPHEN LEVEL  CBC  COMPREHENSIVE METABOLIC PANEL  ETHANOL  SALICYLATE LEVEL  URINE DRUG SCREEN, QUALITATIVE ([place])   ____________________________________________   EKG    ____________________________________________    RADIOLOGY    ____________________________________________   PROCEDURES  ____________________________________________   INITIAL IMPRESSION / ASSESSMENT AND PLAN / ED COURSE  Pertinent labs & imaging results that were available during my care of the patient were reviewed by me and considered in my medical decision making (see chart for details).  Patient placed under involuntary commitment pending psychiatric evaluation due to suicidality with the plan and access to weapons at home. This is in the setting of acute stressors in her life. Only medical complaint is slight dyspnea which she relates to her asthma and does correlate  with slight wheezing on exam. Patient feels this is very mild and nearly baseline, and does not wish to have any treatment for it at this time. We can continue to monitor her breathing and treat her asthma as needed if it becomes worsened. The patient wants help and is happy to be here in the ER and feels safe here, and does not require a one-to-one sitter at this time  ____________________________________________   FINAL CLINICAL IMPRESSION(S) / ED DIAGNOSES  Final diagnoses:  Major depressive disorder, single episode, severe without psychotic features      Carrie Mew, MD 09/10/14 1348

## 2014-09-10 NOTE — ED Notes (Signed)
States has had thoughts of harming self but has done nothing to do so at some point. States plan was to "take a knife"

## 2014-09-10 NOTE — ED Notes (Signed)
Pt takes Combivent for asthma

## 2014-09-10 NOTE — ED Provider Notes (Signed)
Patient cleared by Dr. Weber Cooks for discharge. Patient will continue outpatient follow-up as directed. She denies SI or HI at this time.  [name], MD 09/10/14 [phone]

## 2014-09-10 NOTE — ED Notes (Signed)

## 2014-09-10 NOTE — Consult Note (Signed)
[place] [facility]iat[facility] Consult   Reason for Consult:  Consult for this 59 year old woman with a history of depression sent here from [facility] Referring Physician:  Jimmye Norman Patient Identification: Summer Murphy MRN:  [id number] Principal Diagnosis: Severe recurrent major depression without psychotic features Diagnosis:   Patient Active Problem List   Diagnosis Date Noted  . Severe recurrent major depression without psychotic features [F33.2] 09/10/2014  . COPD (chronic obstructive pulmonary disease) [J44.9] 09/10/2014    Total Time spent with patient: 1 hour  Subjective:   Summer Murphy is a 59 y.o. female patient admitted with "they sent me here because I slept 2 nights with a knife".  HPI:  Patient went to [place] today for her first intake appointment. In giving them the history she told them that last week she had 2 nights in which she slept with a knife in her bed. She did this because she was having thoughts about cutting herself. She did not actually cut herself or try to kill her self. At that time her depression was worse and she was feeling more nervous. Since then it has calm down a little bit that she is still feeling depressed. Mood stays down much of the time. Sleep is often interrupted. Worries a lot and has a lot of nervousness. Major stresses include medical problems such as kidney stones and recent use of Topamax but also her chronic isolation and lack of friends. Review sleep and having any outpatient psychiatric treatment when she went to [facility] for a first intake today. Denies having auditory or visual hallucinations. Has been compliant with prescribed medication. She's been getting treatment recently from the PA in her primary care doctor's office. Weren't psychosis or homicidal ideation.  Past psychiatric history: Has had 2 previous inpatient treatments at [facility] and [place]. Doesn't follow-up very well with outpatient treatment. Currently taking Cymbalta  90 mg a day and Xanax 2 mg at night. Can't remember other medicines she has taken in the past. She did have a suicide attempt about 4 years ago by overdose.  Social history: Fairly isolated. Lives by herself in an apartment. Few friends. Mostly alienated from her family. Doesn't get out very much.  Medical history: Has COPD. Also chronic migraines.  Family history: She does not know of any family history  Substance abuse history: Denies use of alcohol or other recreational drugs. Does not smoke. HPI Elements:   Quality:  Depressed mood and anxiety. Severity:  Moderate. Timing:  Recent over the past couple months it has been worse. Duration:  Lifelong problem which comes and goes. Context:  Chronic isolation and lack of outpatient therapy.  Past Medical History:  Past Medical History  Diagnosis Date  . Asthma   . Hypertension   . Kidney stone   . Migraine   . Anxiety   . Kidney stones     Past Surgical History  Procedure Laterality Date  . Appendectomy    . Cholecystectomy     Family History: History reviewed. No pertinent family history. Social History:  History  Alcohol Use No     History  Drug Use No    History   Social History  . Marital Status: Divorced    Spouse Name: N/A  . Number of Children: N/A  . Years of Education: N/A   Social History Main Topics  . Smoking status: Never Smoker   . Smokeless tobacco: Not on file  . Alcohol Use: No  . Drug Use: No  . Sexual  Activity: Not on file   Other Topics Concern  . None   Social History Narrative   Additional Social History:                          Allergies:   Allergies  Allergen Reactions  . Librium [Chlordiazepoxide] Shortness Of Breath  . Vanilla Shortness Of Breath  . Aspirin Hives  . Morphine And Related   . Reglan [Metoclopramide] Hives    Labs:  Results for orders placed or performed during the hospital encounter of 09/10/14 (from the past 48 hour(s))  Acetaminophen level      Status: Abnormal   Collection Time: 09/10/14  1:18 PM  Result Value Ref Range   Acetaminophen (Tylenol), Serum <10 (L) 10 - 30 ug/mL    Comment:        THERAPEUTIC CONCENTRATIONS VARY SIGNIFICANTLY. A RANGE OF 10-30 ug/mL MAY BE AN EFFECTIVE CONCENTRATION FOR MANY PATIENTS. HOWEVER, SOME ARE BEST TREATED AT CONCENTRATIONS OUTSIDE THIS RANGE. ACETAMINOPHEN CONCENTRATIONS >150 ug/mL AT 4 HOURS AFTER INGESTION AND >50 ug/mL AT 12 HOURS AFTER INGESTION ARE OFTEN ASSOCIATED WITH TOXIC REACTIONS.   Comprehensive metabolic panel     Status: Abnormal   Collection Time: 09/10/14  1:18 PM  Result Value Ref Range   Sodium 137 135 - 145 mmol/L   Potassium 4.2 3.5 - 5.1 mmol/L   Chloride 101 101 - 111 mmol/L   CO2 30 22 - 32 mmol/L   Glucose, Bld 87 65 - 99 mg/dL   BUN 10 6 - 20 mg/dL   Creatinine, Ser 0.59 0.44 - 1.00 mg/dL   Calcium 9.7 8.9 - 10.3 mg/dL   Total Protein 6.8 6.5 - 8.1 g/dL   Albumin 4.0 3.5 - 5.0 g/dL   AST 22 15 - 41 U/L   ALT 20 14 - 54 U/L   Alkaline Phosphatase 104 38 - 126 U/L   Total Bilirubin 0.2 (L) 0.3 - 1.2 mg/dL   GFR calc non Af Amer >60 >60 mL/min   GFR calc Af Amer >60 >60 mL/min    Comment: (NOTE) The eGFR has been calculated using the CKD EPI equation. This calculation has not been validated in all clinical situations. eGFR's persistently <60 mL/min signify possible Chronic Kidney Disease.    Anion gap 6 5 - 15  Ethanol (ETOH)     Status: None   Collection Time: 09/10/14  1:18 PM  Result Value Ref Range   Alcohol, Ethyl (B) <5 <5 mg/dL    Comment:        LOWEST DETECTABLE LIMIT FOR SERUM ALCOHOL IS 5 mg/dL FOR MEDICAL PURPOSES ONLY   Salicylate level     Status: None   Collection Time: 09/10/14  1:18 PM  Result Value Ref Range   Salicylate Lvl <5.4 2.8 - 30.0 mg/dL  Urine Drug Screen, Qualitative ([facility] only)     Status: Abnormal   Collection Time: 09/10/14  1:18 PM  Result Value Ref Range   Tricyclic, Ur Screen POSITIVE (A) NONE  DETECTED   Amphetamines, Ur Screen NONE DETECTED NONE DETECTED   MDMA (Ecstasy)Ur Screen NONE DETECTED NONE DETECTED   Cocaine Metabolite,Ur [place] NONE DETECTED NONE DETECTED   Opiate, Ur Screen NONE DETECTED NONE DETECTED   Phencyclidine (PCP) Ur S NONE DETECTED NONE DETECTED   Cannabinoid 50 Ng, Ur [place] NONE DETECTED NONE DETECTED   Barbiturates, Ur Screen NONE DETECTED NONE DETECTED   Benzodiazepine, Ur Scrn POSITIVE (A) NONE DETECTED  Methadone Scn, Ur NONE DETECTED NONE DETECTED    Comment: (NOTE) 623  Tricyclics, urine               Cutoff 1000 ng/mL 200  Amphetamines, urine             Cutoff 1000 ng/mL 300  MDMA (Ecstasy), urine           Cutoff 500 ng/mL 400  Cocaine Metabolite, urine       Cutoff 300 ng/mL 500  Opiate, urine                   Cutoff 300 ng/mL 600  Phencyclidine (PCP), urine      Cutoff 25 ng/mL 700  Cannabinoid, urine              Cutoff 50 ng/mL 800  Barbiturates, urine             Cutoff 200 ng/mL 900  Benzodiazepine, urine           Cutoff 200 ng/mL 1000 Methadone, urine                Cutoff 300 ng/mL 1100 1200 The urine drug screen provides only a preliminary, unconfirmed 1300 analytical test result and should not be used for non-medical 1400 purposes. Clinical consideration and professional judgment should 1500 be applied to any positive drug screen result due to possible 1600 interfering substances. A more specific alternate chemical method 1700 must be used in order to obtain a confirmed analytical result.  1800 Gas chromato graphy / mass spectrometry (GC/MS) is the preferred 1900 confirmatory method.   Urinalysis complete, with microscopic ([facility] only)     Status: Abnormal   Collection Time: 09/10/14  1:18 PM  Result Value Ref Range   Color, Urine STRAW (A) YELLOW   APPearance HAZY (A) CLEAR   Glucose, UA NEGATIVE NEGATIVE mg/dL   Bilirubin Urine NEGATIVE NEGATIVE   Ketones, ur NEGATIVE NEGATIVE mg/dL   Specific Gravity, Urine 1.003 (L) 1.005 -  1.030   Hgb urine dipstick NEGATIVE NEGATIVE   pH 5.0 5.0 - 8.0   Protein, ur NEGATIVE NEGATIVE mg/dL   Nitrite NEGATIVE NEGATIVE   Leukocytes, UA TRACE (A) NEGATIVE   RBC / HPF 0-5 0 - 5 RBC/hpf   WBC, UA 0-5 0 - 5 WBC/hpf   Bacteria, UA NONE SEEN NONE SEEN   Squamous Epithelial / LPF 6-30 (A) NONE SEEN   Ca Oxalate Crys, UA PRESENT     Vitals: Blood pressure 117/70, pulse 87, temperature 98.1 F (36.7 C), temperature source Oral, resp. rate 20, height $RemoveBe'5\' 3"'upCkcrVhg$  (1.6 m), weight 69.4 kg (153 lb), SpO2 100 %.  Risk to Self: Is patient at risk for suicide?: Yes Risk to Others:   Prior Inpatient Therapy:   Prior Outpatient Therapy:    No current facility-administered medications for this encounter.   Current Outpatient Prescriptions  Medication Sig Dispense Refill  . butalbital-acetaminophen-caffeine (FIORICET WITH CODEINE) 50-325-40-30 MG per capsule Take 1 capsule by mouth every 4 (four) hours as needed for headache (max 4 per day).    Marland Kitchen diclofenac sodium (VOLTAREN) 1 % GEL Apply topically 2 (two) times daily as needed.    Marland Kitchen EPINEPHrine 0.3 mg/0.3 mL IJ SOAJ injection Inject 0.3 mg into the muscle once.    Marland Kitchen ipratropium-albuterol (DUONEB) 0.5-2.5 (3) MG/3ML SOLN Take 3 mLs by nebulization every 4 (four) hours as needed.    . ondansetron (ZOFRAN-ODT) 8 MG disintegrating tablet Take 8 mg  by mouth every 8 (eight) hours as needed for nausea or vomiting.    . tiotropium (SPIRIVA) 18 MCG inhalation capsule Place 18 mcg into inhaler and inhale daily.    Marland Kitchen ALPRAZolam (XANAX) 0.5 MG tablet Take 0.5 mg by mouth 2 (two) times daily as needed for anxiety.    Marland Kitchen amitriptyline (ELAVIL) 50 MG tablet Take 50 mg by mouth at bedtime.    . budesonide-formoterol (SYMBICORT) 160-4.5 MCG/ACT inhaler Inhale 2 puffs into the lungs 2 (two) times daily.    . calcium carbonate (TUMS - DOSED IN MG ELEMENTAL CALCIUM) 500 MG chewable tablet Chew 1 tablet by mouth daily.    . cholecalciferol (VITAMIN D) 1000 UNITS  tablet Take 1,000 Units by mouth daily.    Marland Kitchen diltiazem (DILACOR XR) 180 MG 24 hr capsule Take 180 mg by mouth daily.    . DULoxetine (CYMBALTA) 30 MG capsule Take 90 mg by mouth daily. 30 mg in am, 60 mg in pm    . estrogens, conjugated, (PREMARIN) 0.45 MG tablet Take 0.45 mg by mouth every evening. Take daily for 21 days then do not take for 7 days.    . fluticasone (FLONASE) 50 MCG/ACT nasal spray Place 2 sprays into both nostrils daily.    Marland Kitchen gabapentin (NEURONTIN) 400 MG capsule Take 400 mg by mouth 2 (two) times daily.    . Ipratropium-Albuterol (COMBIVENT) 20-100 MCG/ACT AERS respimat Inhale 1 puff into the lungs 4 (four) times daily.    Marland Kitchen levocetirizine (XYZAL) 5 MG tablet Take 5 mg by mouth daily.    . montelukast (SINGULAIR) 10 MG tablet Take 10 mg by mouth at bedtime.    . Multiple Vitamin (MULTIVITAMIN WITH MINERALS) TABS tablet Take 1 tablet by mouth daily.    Marland Kitchen omeprazole (PRILOSEC) 20 MG capsule Take 20 mg by mouth 2 (two) times daily.    . predniSONE (DELTASONE) 20 MG tablet Take 3 tablets (60 mg total) by mouth daily. 12 tablet 0  . rizatriptan (MAXALT) 5 MG tablet Take 5 mg by mouth as needed for migraine. May repeat in 2 hours if needed    . rOPINIRole (REQUIP) 0.25 MG tablet Take 0.25 mg by mouth 2 (two) times daily.    Marland Kitchen topiramate (TOPAMAX) 50 MG tablet Take 50 mg by mouth at bedtime.    . traMADol (ULTRAM) 50 MG tablet Take 50-100 mg by mouth every 6 (six) hours as needed for moderate pain or severe pain.    . valACYclovir (VALTREX) 500 MG tablet Take 500 mg by mouth 2 (two) times daily. For 5 days when you have a fever blister.      Musculoskeletal: Strength & Muscle Tone: within normal limits Gait & Station: normal Patient leans: N/A  Psychiatric Specialty Exam: Physical Exam  Constitutional: She appears well-developed and well-nourished.  HENT:  Head: Normocephalic and atraumatic.  Eyes: Conjunctivae are normal. Pupils are equal, round, and reactive to light.   Neck: Normal range of motion.  Cardiovascular: Normal heart sounds.   Respiratory: Effort normal.  GI: Soft.  Musculoskeletal: Normal range of motion.  Neurological: She is alert.  Skin: Skin is warm and dry.  Psychiatric: Her speech is normal and behavior is normal. Judgment and thought content normal. She exhibits a depressed mood.    Review of Systems  Constitutional: Negative.   HENT: Negative.   Eyes: Negative.   Respiratory: Negative.   Cardiovascular: Negative.   Gastrointestinal: Negative.   Musculoskeletal: Negative.   Skin: Negative.   Neurological:  Negative.   Psychiatric/Behavioral: Positive for depression. Negative for suicidal ideas, hallucinations, memory loss and substance abuse. The patient is nervous/anxious and has insomnia.     Blood pressure 117/70, pulse 87, temperature 98.1 F (36.7 C), temperature source Oral, resp. rate 20, height _0  (1.6 m), weight 69.4 kg (153 lb), SpO2 100 %.Body mass index is 27.11 kg/(m^2).  General Appearance: Casual  Eye Contact::  Good  Speech:  Clear and Coherent  Volume:  Normal  Mood:  Depressed  Affect:  Appropriate  Thought Process:  Goal Directed  Orientation:  Full (Time, Place, and Person)  Thought Content:  Negative  Suicidal Thoughts:  No  Homicidal Thoughts:  No  Memory:  Immediate;   Good Recent;   Fair Remote;   Good  Judgement:  Fair  Insight:  Fair  Psychomotor Activity:  Decreased  Concentration:  Good  Recall:  Good  Fund of Knowledge:Good  Language: Good  Akathisia:  No  Handed:  Right  AIMS (if indicated):     Assets:  Communication Skills Desire for Improvement Housing  ADL's:  Intact  Cognition: WNL  Sleep:      Medical Decision Making: Review of Psycho-Social Stressors (1), Review or order clinical lab tests (1), Discuss test with performing physician (1), Decision to obtain old records (1), Established Problem, Worsening (2) and Review of Medication Regimen & Side Effects  (2)  Treatment Plan Summary: Plan Patient with a history of chronic recurrent depression. Not psychotic. Had some mild suicidal ideation that she didn't act on last week. No longer having any suicidal ideation currently. Feels like she will be safe at home. Patient does not require inpatient level treatment. Psychoeducation completed about the importance of getting involved with therapy. I would not add or change any medication at this point. Case discussed with emergency room doctor. Discontinue involuntary commitment paperwork. Patient can be released from the emergency room and will follow up with [facility] as scheduled.  Plan:  No evidence of imminent risk to self or others at present.   Patient does not meet criteria for psychiatric inpatient admission. Supportive therapy provided about ongoing stressors. Disposition: Discontinue IVC discharge from the emergency room  [place] 09/10/2014 5:50 PM

## 2014-09-10 NOTE — ED Notes (Signed)
Pt states that she went to [place] to be seen outpatient, was sent here to be committed due to anxiety. Pt reports sleeping with knife for the last 2 nights with thoughts of hurting herself. Hx of SI by slitting her wrists. Pt tearful. Denies HI.

## 2014-09-26 DIAGNOSIS — I10 Essential (primary) hypertension: Secondary | ICD-10-CM | POA: Diagnosis not present

## 2014-09-26 DIAGNOSIS — Z0001 Encounter for general adult medical examination with abnormal findings: Secondary | ICD-10-CM | POA: Diagnosis not present

## 2014-09-26 DIAGNOSIS — F502 Bulimia nervosa: Secondary | ICD-10-CM | POA: Diagnosis not present

## 2014-09-26 DIAGNOSIS — Z124 Encounter for screening for malignant neoplasm of cervix: Secondary | ICD-10-CM | POA: Diagnosis not present

## 2014-09-26 DIAGNOSIS — G43909 Migraine, unspecified, not intractable, without status migrainosus: Secondary | ICD-10-CM | POA: Diagnosis not present

## 2014-09-26 DIAGNOSIS — J019 Acute sinusitis, unspecified: Secondary | ICD-10-CM | POA: Diagnosis not present

## 2014-09-26 DIAGNOSIS — J4540 Moderate persistent asthma, uncomplicated: Secondary | ICD-10-CM | POA: Diagnosis not present

## 2014-10-11 ENCOUNTER — Ambulatory Visit: Payer: Medicare Other | Admitting: Dietician

## 2014-10-15 DIAGNOSIS — M85852 Other specified disorders of bone density and structure, left thigh: Secondary | ICD-10-CM | POA: Diagnosis not present

## 2014-10-15 DIAGNOSIS — Z1231 Encounter for screening mammogram for malignant neoplasm of breast: Secondary | ICD-10-CM | POA: Diagnosis not present

## 2014-10-15 DIAGNOSIS — M85862 Other specified disorders of bone density and structure, left lower leg: Secondary | ICD-10-CM | POA: Diagnosis not present

## 2014-10-22 DIAGNOSIS — J301 Allergic rhinitis due to pollen: Secondary | ICD-10-CM | POA: Diagnosis not present

## 2014-10-22 DIAGNOSIS — J0191 Acute recurrent sinusitis, unspecified: Secondary | ICD-10-CM | POA: Diagnosis not present

## 2014-10-22 DIAGNOSIS — J449 Chronic obstructive pulmonary disease, unspecified: Secondary | ICD-10-CM | POA: Diagnosis not present

## 2014-11-01 ENCOUNTER — Encounter: Payer: Medicare Other | Attending: Internal Medicine | Admitting: Dietician

## 2014-11-01 ENCOUNTER — Encounter: Payer: Self-pay | Admitting: Dietician

## 2014-11-01 VITALS — Ht 63.0 in | Wt 154.0 lb

## 2014-11-01 DIAGNOSIS — E119 Type 2 diabetes mellitus without complications: Secondary | ICD-10-CM | POA: Diagnosis not present

## 2014-11-01 DIAGNOSIS — N289 Disorder of kidney and ureter, unspecified: Secondary | ICD-10-CM | POA: Diagnosis present

## 2014-11-01 NOTE — Patient Instructions (Addendum)
Establish a meal pattern of 3 meals and 1-2 snacks. Space meals 4-5 hours apart: 10:00am,  2:00pm and 6-7:00pm. Try the smoothie  with 2 servings of carbohydrate for breakfast rather than in the evening. Include at least 2 servings of carbohydrate per meal with range of 2-3 servings + protein and non-starchy vegetables at meals. Include a small bedtime snack with a carbohydrate and 1 oz. of protein. Read labels for sodium with goal of no more than 2000 mg/day; 1500mg  ideal. Drink 6-8 glasses of water per day. Avoid sugar sweetened beverages unless treating a low blood sugar.

## 2014-11-01 NOTE — Progress Notes (Signed)
Medical Nutrition Therapy: Visit start time: 15:10 end time: 16:20 Assessment:  Diagnosis: Type 2 diabetes, renal disease Past medical history: hypertension, GI Reflux, bulimia, migraines, anxiety Psychosocial issues/ stress concerns: Pt. Reports she has history of bulimia and due to increased stress, she has resumed binging and purging the past 3-4 months. She reports she is meeting with a counselor who is aware of her eating disorder. She purges on average 1-2 x/month. Preferred learning method:  . Auditory  Current weight: 154.9  Height: 63 in Medications, supplements: see list Progress and evaluation:  Patient in for initial nutrition assessment appointment. Reports she has lost approx. 15 lbs. In past year and is scared of gaining back the weight. She reports increased stress which she states has contributed to episodes of binging/purging by vomiting in past 3-4 months. She states she is having low blood sugars verses elevated but does not check her glucose. She has been eating 1 meal per day, usually in the evening and drinks soda during the day. She states she needs to drink soda to prevent hypoglycemia.  Physical activity: walks for exercise 5 day/week for 20 minutes.  Dietary Intake:  Wakes up at 10:00 or 11:00am.  Drinks soda during the day and eats a meal by 8 or 9:00pm such as Ramen noodles or a peanut butter and jelly sandwiches. Has also been making a smoothie at night with fruit, almond milk, and juice.  Nutrition Care Education: Eating Disorder: Stressed the importance of continuing counseling related to bulimia. Discussed how establishing a meal pattern to meet nutrient needs and avoid hypoglycemia is more important than any focus on calories and weight loss. Diabetes:  Instructed on a meal plan for diabetes including carbohydrate counting and balance of carbohydrate, protein, fat and non-starchy vegetables. Explained how meals spaced 4-5 hours apart can help prevent hypoglycemia.  Discussed how hypoglycemia can trigger binges. Instructed on treatment for hypoglycemia. (Used 1500 calories as a basis for determining carbohydrate and protein needs but did not discuss calories with patient). Kidney Stones: Patient stated that her kidney stones were oxalate/calcium. Gave list of high oxalate foods to limit. Also, discussed importance of controlling sodium intake for kidney health. Stressed importance of adequate water intake.   Nutritional Diagnosis:  NB-1.5 Disordered eating pattern As related to binging/bulimia, only 1 meal per day.  As evidenced by diet history..  Intervention:  Establish a meal pattern of 3 meals and 1-2 snacks. Space meals 4-5 hours apart: 10:00am,  2:00pm and 6-7:00pm. Try the smoothie  with 2 servings of carbohydrate for breakfast rather than in the evening. Include at least 2 servings of carbohydrate per meal with range of 2-3 servings + protein and non-starchy vegetables at meals. Include a small bedtime snack with a carbohydrate and 1 oz. of protein. Read labels for sodium with goal of no more than 2000 mg/day; 1500mg  ideal. Drink 6-8 glasses of water per day. Avoid sugar sweetened beverages unless treating a low blood sugar.    Education Materials given:  . Food lists/ Planning A Balanced Meal . Sample meal pattern/ menus . Goals/ instructions . Other: List of high oxalate foods to limit  Learner/ who was taught:  . Patient   Level of understanding: . Partial understanding; needs review/ practice Learning barriers: . None  Willingness to learn/ readiness for change: . Hesitance, contemplating change Monitoring and Evaluation:  Dietary intake, exercise, , and body weight      follow up: 11/29/14 at 1:15pm

## 2014-11-02 DIAGNOSIS — R928 Other abnormal and inconclusive findings on diagnostic imaging of breast: Secondary | ICD-10-CM | POA: Diagnosis not present

## 2014-11-07 DIAGNOSIS — B372 Candidiasis of skin and nail: Secondary | ICD-10-CM | POA: Diagnosis not present

## 2014-11-07 DIAGNOSIS — L03818 Cellulitis of other sites: Secondary | ICD-10-CM | POA: Diagnosis not present

## 2014-11-07 DIAGNOSIS — F331 Major depressive disorder, recurrent, moderate: Secondary | ICD-10-CM | POA: Diagnosis not present

## 2014-11-21 ENCOUNTER — Encounter: Payer: Self-pay | Admitting: Obstetrics and Gynecology

## 2014-11-29 ENCOUNTER — Ambulatory Visit: Payer: Medicare Other | Admitting: Dietician

## 2014-12-03 ENCOUNTER — Ambulatory Visit: Payer: Medicare Other | Admitting: Dietician

## 2014-12-04 ENCOUNTER — Encounter: Payer: Self-pay | Admitting: *Deleted

## 2014-12-04 ENCOUNTER — Emergency Department
Admission: EM | Admit: 2014-12-04 | Discharge: 2014-12-04 | Disposition: A | Discharge status: Home / Self Care | Payer: Medicare Other | Attending: Emergency Medicine | Admitting: Emergency Medicine

## 2014-12-04 DIAGNOSIS — E119 Type 2 diabetes mellitus without complications: Secondary | ICD-10-CM | POA: Diagnosis not present

## 2014-12-04 DIAGNOSIS — T7840XA Allergy, unspecified, initial encounter: Secondary | ICD-10-CM | POA: Diagnosis not present

## 2014-12-04 DIAGNOSIS — Z79899 Other long term (current) drug therapy: Secondary | ICD-10-CM | POA: Diagnosis not present

## 2014-12-04 DIAGNOSIS — Y9389 Activity, other specified: Secondary | ICD-10-CM | POA: Diagnosis not present

## 2014-12-04 DIAGNOSIS — X58XXXA Exposure to other specified factors, initial encounter: Secondary | ICD-10-CM | POA: Insufficient documentation

## 2014-12-04 DIAGNOSIS — Y9289 Other specified places as the place of occurrence of the external cause: Secondary | ICD-10-CM | POA: Insufficient documentation

## 2014-12-04 DIAGNOSIS — Y998 Other external cause status: Secondary | ICD-10-CM | POA: Insufficient documentation

## 2014-12-04 DIAGNOSIS — Z7951 Long term (current) use of inhaled steroids: Secondary | ICD-10-CM | POA: Insufficient documentation

## 2014-12-04 DIAGNOSIS — R21 Rash and other nonspecific skin eruption: Secondary | ICD-10-CM | POA: Diagnosis present

## 2014-12-04 DIAGNOSIS — I10 Essential (primary) hypertension: Secondary | ICD-10-CM | POA: Diagnosis not present

## 2014-12-04 MED ORDER — IPRATROPIUM-ALBUTEROL 0.5-2.5 (3) MG/3ML IN SOLN: 3.0000 mL | Freq: Once | RESPIRATORY_TRACT | Status: DC

## 2014-12-04 MED ORDER — FAMOTIDINE IN NACL 20-0.9 MG/50ML-% IV SOLN
20.0000 mg | Freq: Once | INTRAVENOUS | Status: AC
  Administered 2014-12-04: 20 mg via INTRAVENOUS
  Filled 2014-12-04: qty 50

## 2014-12-04 MED ORDER — IPRATROPIUM-ALBUTEROL 0.5-2.5 (3) MG/3ML IN SOLN
6.0000 mL | Freq: Four times a day (QID) | RESPIRATORY_TRACT | Status: DC
  Administered 2014-12-04: 6 mL via RESPIRATORY_TRACT

## 2014-12-04 MED ORDER — METHYLPREDNISOLONE SODIUM SUCC 125 MG IJ SOLR
125.0000 mg | Freq: Once | INTRAMUSCULAR | Status: AC
  Administered 2014-12-04: 125 mg via INTRAVENOUS
  Filled 2014-12-04: qty 2

## 2014-12-04 MED ORDER — IPRATROPIUM-ALBUTEROL 0.5-2.5 (3) MG/3ML IN SOLN
RESPIRATORY_TRACT | Status: AC
  Administered 2014-12-04: 6 mL via RESPIRATORY_TRACT
  Filled 2014-12-04: qty 6

## 2014-12-04 MED ORDER — DIPHENHYDRAMINE HCL 50 MG/ML IJ SOLN
50.0000 mg | Freq: Once | INTRAMUSCULAR | Status: AC
  Administered 2014-12-04: 50 mg via INTRAVENOUS
  Filled 2014-12-04: qty 1

## 2014-12-04 NOTE — ED Provider Notes (Signed)
[facility] Emergency Department Provider Note  ____________________________________________  Time seen: 3:00 AM  I have reviewed the triage vital signs and the nursing notes.   HISTORY  Chief Complaint Rash and Allergic Reaction     HPI Summer Murphy is a 59 y.o. female presentswith acute onset of dyspnea, wheezing and generalized itching after "being around a cat tonight". She states she has a known allergy to cats which she believes precipitated this event. Patient denies any chest pain no fever    Past Medical History  Diagnosis Date  . Asthma   . Hypertension   . Kidney stone   . Migraine   . Anxiety   . Kidney stones   . Diabetes mellitus without complication   . Yeast vaginitis   . Left flank pain     Patient Active Problem List   Diagnosis Date Noted  . Severe recurrent major depression without psychotic features 09/10/2014  . COPD (chronic obstructive pulmonary disease) 09/10/2014  . Calculi, ureter 04/26/2013  . Corneal graft malfunction 08/17/2012  . Calculus of kidney 04/26/2012  . Renal colic 01/77/9390  . Urge incontinence 04/26/2012  . Diaphragmatic hernia 10/27/2010  . Barrett esophagus 07/07/2010    Past Surgical History  Procedure Laterality Date  . Appendectomy    . Cholecystectomy    . Ureteroscopy    . Laparoscopic hysterectomy    . Lithotripsy      Current Outpatient Rx  Name  Route  Sig  Dispense  Refill  . ALPRAZolam (XANAX) 0.5 MG tablet   Oral   Take 0.5 mg by mouth 2 (two) times daily as needed for anxiety.         Marland Kitchen amitriptyline (ELAVIL) 50 MG tablet   Oral   Take 50 mg by mouth at bedtime.         . budesonide-formoterol (SYMBICORT) 160-4.5 MCG/ACT inhaler   Inhalation   Inhale 2 puffs into the lungs 2 (two) times daily.         . Calcium Carbonate-Vitamin D (CALCIUM-VITAMIN D) 500-200 MG-UNIT per tablet   Oral   Take 1 tablet by mouth daily.         . cholecalciferol (VITAMIN D)  1000 UNITS tablet   Oral   Take 1,000 Units by mouth daily.         . citalopram (CELEXA) 10 MG tablet   Oral   Take 10 mg by mouth daily.         . diclofenac sodium (VOLTAREN) 1 % GEL   Topical   Apply topically 2 (two) times daily as needed.         . diltiazem (DILACOR XR) 180 MG 24 hr capsule   Oral   Take 180 mg by mouth daily.         . DULoxetine (CYMBALTA) 30 MG capsule   Oral   Take 90 mg by mouth daily. 30 mg in am, 60 mg in pm         . EPINEPHrine 0.3 mg/0.3 mL IJ SOAJ injection   Intramuscular   Inject 0.3 mg into the muscle once.         . estrogens, conjugated, (PREMARIN) 0.45 MG tablet   Oral   Take 0.45 mg by mouth every evening. Take daily for 21 days then do not take for 7 days.         . fluticasone (FLONASE) 50 MCG/ACT nasal spray   Each Nare   Place 2 sprays  into both nostrils daily.         Marland Kitchen gabapentin (NEURONTIN) 400 MG capsule   Oral   Take 400 mg by mouth 2 (two) times daily.         . Ipratropium-Albuterol (COMBIVENT) 20-100 MCG/ACT AERS respimat   Inhalation   Inhale 1 puff into the lungs 4 (four) times daily.         Marland Kitchen ipratropium-albuterol (DUONEB) 0.5-2.5 (3) MG/3ML SOLN   Nebulization   Take 3 mLs by nebulization every 4 (four) hours as needed.         Marland Kitchen levocetirizine (XYZAL) 5 MG tablet   Oral   Take 5 mg by mouth daily.         . montelukast (SINGULAIR) 10 MG tablet   Oral   Take 10 mg by mouth at bedtime.         . Multiple Vitamin (MULTIVITAMIN WITH MINERALS) TABS tablet   Oral   Take 1 tablet by mouth daily.         Marland Kitchen omeprazole (PRILOSEC) 20 MG capsule   Oral   Take 20 mg by mouth 2 (two) times daily.         . ondansetron (ZOFRAN-ODT) 8 MG disintegrating tablet   Oral   Take 8 mg by mouth every 8 (eight) hours as needed for nausea or vomiting.         . predniSONE (DELTASONE) 20 MG tablet   Oral   Take 3 tablets (60 mg total) by mouth daily. Patient not taking: Reported on  11/01/2014   12 tablet   0   . rizatriptan (MAXALT) 5 MG tablet   Oral   Take 5 mg by mouth as needed for migraine. May repeat in 2 hours if needed         . rOPINIRole (REQUIP) 0.25 MG tablet   Oral   Take 0.25 mg by mouth 2 (two) times daily.         Marland Kitchen tiotropium (SPIRIVA) 18 MCG inhalation capsule   Inhalation   Place 18 mcg into inhaler and inhale daily.         Marland Kitchen topiramate (TOPAMAX) 50 MG tablet   Oral   Take 50 mg by mouth at bedtime.         . traMADol (ULTRAM) 50 MG tablet   Oral   Take 50-100 mg by mouth every 6 (six) hours as needed for moderate pain or severe pain.         . valACYclovir (VALTREX) 500 MG tablet   Oral   Take 500 mg by mouth 2 (two) times daily. For 5 days when you have a fever blister.           Allergies Librium; Nsaids; Vanilla; Aspirin; Azithromycin; Chlordiazepoxide hcl; Iron; Morphine and related; Reglan; and Sulfa antibiotics  Family History  Problem Relation Age of Onset  . Prostate cancer Father   . Kidney Stones Father     Social History Social History  Substance Use Topics  . Smoking status: Never Smoker   . Smokeless tobacco: None  . Alcohol Use: No    Review of Systems  Constitutional: Negative for fever. Eyes: Negative for visual changes. ENT: Negative for sore throat. Cardiovascular: Negative for chest pain. Respiratory: positive for wheezing Gastrointestinal: Negative for abdominal pain, vomiting and diarrhea. Genitourinary: Negative for dysuria. Musculoskeletal: Negative for back pain. Skin: Negative for rash. Neurological: Negative for headaches, focal weakness or numbness.   10-point ROS otherwise negative.  ____________________________________________   PHYSICAL EXAM:  VITAL SIGNS: ED Triage Vitals  Enc Vitals Group     BP 12/04/14 0205 134/78 mmHg     Pulse Rate 12/04/14 0205 110     Resp 12/04/14 0205 24     Temp 12/04/14 0205 98.4 F (36.9 C)     Temp Source 12/04/14 0205 Oral      SpO2 12/04/14 0205 97 %     Weight 12/04/14 0205 158 lb (71.668 kg)     Height 12/04/14 0205 5\' 3"  (1.6 m)     Head Cir --      Peak Flow --      Pain Score --      Pain Loc --      Pain Edu? --      Excl. in [place]? --      Constitutional: Alert and oriented. Well appearing and in no distress. Eyes: Conjunctivae are normal. PERRL. Normal extraocular movements. ENT   Head: Normocephalic and atraumatic.   Nose: No congestion/rhinnorhea.   Mouth/Throat: Mucous membranes are moist.   Neck: No stridor. Hematological/Lymphatic/Immunilogical: No cervical lymphadenopathy. Cardiovascular: Normal rate, regular rhythm. Normal and symmetric distal pulses are present in all extremities. No murmurs, rubs, or gallops. Respiratory: Normal respiratory effort without tachypnea nor retractions. Breath sounds are clear and equal bilaterally. Extremity wheezes noted Gastrointestinal: Soft and nontender. No distention. There is no CVA tenderness. Genitourinary: deferred Musculoskeletal: Nontender with normal range of motion in all extremities. No joint effusions.  No lower extremity tenderness nor edema. Neurologic:  Normal speech and language. No gross focal neurologic deficits are appreciated. Speech is normal.  Skin:  Skin is warm, dry and intact. No rash noted. Psychiatric: Mood and affect are normal. Speech and behavior are normal. Patient exhibits appropriate insight and judgment.     INITIAL IMPRESSION / ASSESSMENT AND PLAN / ED COURSE  Pertinent labs & imaging results that were available during my care of the patient were reviewed by me and considered in my medical decision making (see chart for details).  Patient received 2 DuoNeb's Solu-Medrol Benadryl and Pepcid with resolution of symptoms  ____________________________________________   FINAL CLINICAL IMPRESSION(S) / ED DIAGNOSES  Final diagnoses:  Allergic reaction, initial encounter      Gregor Hams,  MD 12/04/14 [phone]

## 2014-12-04 NOTE — ED Notes (Signed)
Assumed pt care at this time. NAD noted. RR even and nonlabored. Will continue to monitor. 

## 2014-12-04 NOTE — ED Notes (Signed)
Ice water provided to patient

## 2014-12-04 NOTE — ED Notes (Addendum)
Pt states she was around a cat tonight and now has a rash on legs and arms. Pt itching.   Pt also has wheezing.  Pt took a sudafed without relief.

## 2014-12-04 NOTE — Discharge Instructions (Signed)

## 2014-12-05 ENCOUNTER — Ambulatory Visit: Payer: Self-pay | Admitting: Obstetrics and Gynecology

## 2014-12-21 ENCOUNTER — Encounter: Payer: Self-pay | Admitting: Dietician

## 2015-01-01 DIAGNOSIS — Z79891 Long term (current) use of opiate analgesic: Secondary | ICD-10-CM | POA: Diagnosis not present

## 2015-01-11 ENCOUNTER — Emergency Department
Admission: EM | Admit: 2015-01-11 | Discharge: 2015-01-12 | Disposition: A | Discharge status: Home / Self Care | Payer: Medicare Other | Attending: Student | Admitting: Student

## 2015-01-11 ENCOUNTER — Encounter: Payer: Self-pay | Admitting: Emergency Medicine

## 2015-01-11 ENCOUNTER — Emergency Department: Payer: Medicare Other

## 2015-01-11 DIAGNOSIS — R000 Tachycardia, unspecified: Secondary | ICD-10-CM | POA: Insufficient documentation

## 2015-01-11 DIAGNOSIS — N200 Calculus of kidney: Secondary | ICD-10-CM | POA: Diagnosis not present

## 2015-01-11 DIAGNOSIS — I10 Essential (primary) hypertension: Secondary | ICD-10-CM | POA: Insufficient documentation

## 2015-01-11 DIAGNOSIS — N201 Calculus of ureter: Secondary | ICD-10-CM | POA: Insufficient documentation

## 2015-01-11 DIAGNOSIS — R109 Unspecified abdominal pain: Secondary | ICD-10-CM | POA: Diagnosis present

## 2015-01-11 DIAGNOSIS — Z7951 Long term (current) use of inhaled steroids: Secondary | ICD-10-CM | POA: Diagnosis not present

## 2015-01-11 DIAGNOSIS — Z79899 Other long term (current) drug therapy: Secondary | ICD-10-CM | POA: Insufficient documentation

## 2015-01-11 DIAGNOSIS — E119 Type 2 diabetes mellitus without complications: Secondary | ICD-10-CM | POA: Insufficient documentation

## 2015-01-11 LAB — URINALYSIS COMPLETE WITH MICROSCOPIC (ARMC ONLY)
Bilirubin Urine: NEGATIVE
Glucose, UA: NEGATIVE mg/dL
Ketones, ur: NEGATIVE mg/dL
Nitrite: NEGATIVE
Protein, ur: 30 mg/dL — AB
RBC / HPF: NONE SEEN RBC/hpf (ref 0–5)
Specific Gravity, Urine: 1.02 (ref 1.005–1.030)
pH: 5 (ref 5.0–8.0)

## 2015-01-11 LAB — COMPREHENSIVE METABOLIC PANEL
ALT: 14 U/L (ref 14–54)
AST: 21 U/L (ref 15–41)
Albumin: 4.1 g/dL (ref 3.5–5.0)
Alkaline Phosphatase: 85 U/L (ref 38–126)
Anion gap: 6 (ref 5–15)
BUN: 14 mg/dL (ref 6–20)
CO2: 26 mmol/L (ref 22–32)
Calcium: 9.4 mg/dL (ref 8.9–10.3)
Chloride: 107 mmol/L (ref 101–111)
Creatinine, Ser: 0.78 mg/dL (ref 0.44–1.00)
GFR calc Af Amer: >60 mL/min (ref >60)
GFR calc non Af Amer: >60 mL/min (ref >60)
Glucose, Bld: 110 mg/dL — ABNORMAL HIGH (ref 65–99)
Potassium: 3.9 mmol/L (ref 3.5–5.1)
Sodium: 139 mmol/L (ref 135–145)
Total Bilirubin: 0.5 mg/dL (ref 0.3–1.2)
Total Protein: 7.5 g/dL (ref 6.5–8.1)

## 2015-01-11 MED ORDER — TAMSULOSIN HCL 0.4 MG PO CAPS: 0.4000 mg | ORAL_CAPSULE | Freq: Every day | ORAL | Status: DC

## 2015-01-11 MED ORDER — IOHEXOL 300 MG/ML  SOLN
100.0000 mL | Freq: Once | INTRAMUSCULAR | Status: AC | PRN
  Administered 2015-01-11: 100 mL via INTRAVENOUS

## 2015-01-11 MED ORDER — KETOROLAC TROMETHAMINE 30 MG/ML IJ SOLN
30.0000 mg | Freq: Once | INTRAMUSCULAR | Status: AC
  Administered 2015-01-11: 30 mg via INTRAVENOUS

## 2015-01-11 MED ORDER — ONDANSETRON HCL 4 MG/2ML IJ SOLN
4.0000 mg | Freq: Once | INTRAMUSCULAR | Status: AC
  Administered 2015-01-11: 4 mg via INTRAVENOUS

## 2015-01-11 MED ORDER — SODIUM CHLORIDE 0.9 % IV SOLN
1000.0000 mL | Freq: Once | INTRAVENOUS | Status: AC
  Administered 2015-01-11: 1000 mL via INTRAVENOUS

## 2015-01-11 MED ORDER — FENTANYL CITRATE (PF) 100 MCG/2ML IJ SOLN
75.0000 ug | Freq: Once | INTRAMUSCULAR | Status: AC
Start: 2015-01-11 — End: 2015-01-11
  Administered 2015-01-11: 75 ug via INTRAVENOUS

## 2015-01-11 MED ORDER — SODIUM CHLORIDE 0.9 % IV SOLN: 1000.0000 mL | INTRAVENOUS | Status: DC

## 2015-01-11 MED ORDER — OXYCODONE HCL 5 MG PO TABS
10.0000 mg | ORAL_TABLET | Freq: Once | ORAL | Status: AC
  Administered 2015-01-11: 10 mg via ORAL
  Filled 2015-01-11: qty 2

## 2015-01-11 MED ORDER — ONDANSETRON 4 MG PO TBDP: 4.0000 mg | ORAL_TABLET | Freq: Three times a day (TID) | ORAL | Status: DC | PRN

## 2015-01-11 MED ORDER — IOHEXOL 240 MG/ML SOLN
25.0000 mL | Freq: Once | INTRAMUSCULAR | Status: AC | PRN
  Administered 2015-01-11: 25 mL via ORAL

## 2015-01-11 MED ORDER — OXYCODONE HCL 5 MG PO TABS: 5.0000 mg | ORAL_TABLET | Freq: Four times a day (QID) | ORAL | Status: DC | PRN

## 2015-01-11 MED ORDER — ONDANSETRON HCL 4 MG/2ML IJ SOLN
4.0000 mg | Freq: Once | INTRAMUSCULAR | Status: AC
  Administered 2015-01-11: 4 mg via INTRAVENOUS
  Filled 2015-01-11 (×2): qty 2

## 2015-01-11 MED ORDER — ONDANSETRON HCL 4 MG/2ML IJ SOLN
INTRAMUSCULAR | Status: AC
  Filled 2015-01-11: qty 2

## 2015-01-11 MED ORDER — FENTANYL CITRATE (PF) 100 MCG/2ML IJ SOLN
INTRAMUSCULAR | Status: AC
  Filled 2015-01-11: qty 2

## 2015-01-11 MED ORDER — KETOROLAC TROMETHAMINE 30 MG/ML IJ SOLN
INTRAMUSCULAR | Status: AC
Start: 2015-01-11 — End: 2015-01-11
  Filled 2015-01-11: qty 1

## 2015-01-11 MED ORDER — SODIUM CHLORIDE 0.9 % IV BOLUS (SEPSIS)
500.0000 mL | Freq: Once | INTRAVENOUS | Status: AC
  Administered 2015-01-11: 500 mL via INTRAVENOUS

## 2015-01-11 NOTE — ED Provider Notes (Signed)
[facility] Emergency Department Provider Note  ____________________________________________  Time seen: Approximately 10:00 PM  I have reviewed the triage vital signs and the nursing notes.   HISTORY  Chief Complaint Flank Pain    HPI Summer Murphy is a 59 y.o. female she has hypertension, diabetes, asthma, recurrent kidney stones who presents for evaluation of sudden onset severe sharp right flank pain radiating to the right lower quadrant today. Pain has been constant since onset. She has had associated nausea as well as nonbloody nonbilious emesis. No fevers or chills. No diarrhea. No chest pain or difficulty breathing. No modifying factors. She reports it feels similar to her usual kidney stones.   Past Medical History  Diagnosis Date  . Asthma   . Hypertension   . Kidney stone   . Migraine   . Anxiety   . Kidney stones   . Diabetes mellitus without complication ([place])   . Yeast vaginitis   . Left flank pain     Patient Active Problem List   Diagnosis Date Noted  . Severe recurrent major depression without psychotic features ([place]) 09/10/2014  . COPD (chronic obstructive pulmonary disease) ([place]) 09/10/2014  . Calculi, ureter 04/26/2013  . Corneal graft malfunction 08/17/2012  . Calculus of kidney 04/26/2012  . Renal colic 85/04/7739  . Urge incontinence 04/26/2012  . Diaphragmatic hernia 10/27/2010  . Barrett esophagus 07/07/2010    Past Surgical History  Procedure Laterality Date  . Appendectomy    . Cholecystectomy    . Ureteroscopy    . Laparoscopic hysterectomy    . Lithotripsy      Current Outpatient Rx  Name  Route  Sig  Dispense  Refill  . ALPRAZolam (XANAX) 0.5 MG tablet   Oral   Take 0.5 mg by mouth 2 (two) times daily as needed for anxiety.         Marland Kitchen amitriptyline (ELAVIL) 50 MG tablet   Oral   Take 50 mg by mouth at bedtime.         . budesonide-formoterol (SYMBICORT) 160-4.5 MCG/ACT inhaler   Inhalation  Inhale 2 puffs into the lungs 2 (two) times daily.         . Calcium Carbonate-Vitamin D (CALCIUM-VITAMIN D) 500-200 MG-UNIT per tablet   Oral   Take 1 tablet by mouth daily.         . cholecalciferol (VITAMIN D) 1000 UNITS tablet   Oral   Take 1,000 Units by mouth daily.         . citalopram (CELEXA) 10 MG tablet   Oral   Take 10 mg by mouth daily.         . diclofenac sodium (VOLTAREN) 1 % GEL   Topical   Apply topically 2 (two) times daily as needed.         . diltiazem (DILACOR XR) 180 MG 24 hr capsule   Oral   Take 180 mg by mouth daily.         . DULoxetine (CYMBALTA) 30 MG capsule   Oral   Take 90 mg by mouth daily. 30 mg in am, 60 mg in pm         . EPINEPHrine 0.3 mg/0.3 mL IJ SOAJ injection   Intramuscular   Inject 0.3 mg into the muscle once.         . estrogens, conjugated, (PREMARIN) 0.45 MG tablet   Oral   Take 0.45 mg by mouth every evening. Take daily for 21 days then  do not take for 7 days.         . fluticasone (FLONASE) 50 MCG/ACT nasal spray   Each Nare   Place 2 sprays into both nostrils daily.         Marland Kitchen gabapentin (NEURONTIN) 400 MG capsule   Oral   Take 400 mg by mouth 2 (two) times daily.         . Ipratropium-Albuterol (COMBIVENT) 20-100 MCG/ACT AERS respimat   Inhalation   Inhale 1 puff into the lungs 4 (four) times daily.         Marland Kitchen ipratropium-albuterol (DUONEB) 0.5-2.5 (3) MG/3ML SOLN   Nebulization   Take 3 mLs by nebulization every 4 (four) hours as needed.         Marland Kitchen levocetirizine (XYZAL) 5 MG tablet   Oral   Take 5 mg by mouth daily.         . montelukast (SINGULAIR) 10 MG tablet   Oral   Take 10 mg by mouth at bedtime.         . Multiple Vitamin (MULTIVITAMIN WITH MINERALS) TABS tablet   Oral   Take 1 tablet by mouth daily.         Marland Kitchen omeprazole (PRILOSEC) 20 MG capsule   Oral   Take 20 mg by mouth 2 (two) times daily.         . ondansetron (ZOFRAN ODT) 4 MG disintegrating tablet    Oral   Take 1 tablet (4 mg total) by mouth every 8 (eight) hours as needed for nausea or vomiting.   12 tablet   0   . oxyCODONE (ROXICODONE) 5 MG immediate release tablet   Oral   Take 1 tablet (5 mg total) by mouth every 6 (six) hours as needed for moderate pain. Do not drive while taking this medication.   12 tablet   0   . predniSONE (DELTASONE) 20 MG tablet   Oral   Take 3 tablets (60 mg total) by mouth daily. Patient not taking: Reported on 11/01/2014   12 tablet   0   . rizatriptan (MAXALT) 5 MG tablet   Oral   Take 5 mg by mouth as needed for migraine. May repeat in 2 hours if needed         . rOPINIRole (REQUIP) 0.25 MG tablet   Oral   Take 0.25 mg by mouth 2 (two) times daily.         . tamsulosin (FLOMAX) 0.4 MG CAPS capsule   Oral   Take 1 capsule (0.4 mg total) by mouth daily.   14 capsule   0   . tiotropium (SPIRIVA) 18 MCG inhalation capsule   Inhalation   Place 18 mcg into inhaler and inhale daily.         Marland Kitchen topiramate (TOPAMAX) 50 MG tablet   Oral   Take 50 mg by mouth at bedtime.         . traMADol (ULTRAM) 50 MG tablet   Oral   Take 50-100 mg by mouth every 6 (six) hours as needed for moderate pain or severe pain.         . valACYclovir (VALTREX) 500 MG tablet   Oral   Take 500 mg by mouth 2 (two) times daily. For 5 days when you have a fever blister.           Allergies Librium; Nsaids; Vanilla; Aspirin; Azithromycin; Chlordiazepoxide hcl; Iron; Morphine and related; Reglan; and Sulfa antibiotics  Family History  Problem Relation Age of Onset  . Prostate cancer Father   . Kidney Stones Father     Social History Social History  Substance Use Topics  . Smoking status: Never Smoker   . Smokeless tobacco: None  . Alcohol Use: No    Review of Systems Constitutional: No fever/chills Eyes: No visual changes. ENT: No sore throat. Cardiovascular: Denies chest pain. Respiratory: Denies shortness of breath. Gastrointestinal:  +abdominal pain.  + nausea, + vomiting.  No diarrhea.  No constipation. Genitourinary: Negative for dysuria. Musculoskeletal: Positive for right flank pain Skin: Negative for rash. Neurological: Negative for headaches, focal weakness or numbness.  10-point ROS otherwise negative.  ____________________________________________   PHYSICAL EXAM:  VITAL SIGNS: ED Triage Vitals  Enc Vitals Group     BP 01/11/15 2034 120/92 mmHg     Pulse Rate 01/11/15 2034 115     Resp 01/11/15 2034 28     Temp 01/11/15 2034 98.4 F (36.9 C)     Temp Source 01/11/15 2034 Oral     SpO2 01/11/15 2034 96 %     Weight 01/11/15 2034 165 lb (74.844 kg)     Height 01/11/15 2034 5\' 3"  (1.6 m)     Head Cir --      Peak Flow --      Pain Score 01/11/15 2035 10     Pain Loc --      Pain Edu? --      Excl. in [place]? --     Constitutional: Alert and oriented. In distress secondary to pain. Eyes: Conjunctivae are normal. PERRL. EOMI. Head: Atraumatic. Nose: No congestion/rhinnorhea. Mouth/Throat: Mucous membranes are moist.  Oropharynx non-erythematous. Neck: No stridor.   Cardiovascular: tachycardic rate, regular rhythm. Grossly normal heart sounds.  Good peripheral circulation. Respiratory: Normal respiratory effort.  No retractions. Lungs CTAB. Gastrointestinal: Soft and nontender. No distention. No abdominal bruits. + right CVA tenderness. Genitourinary: deferred Musculoskeletal: No lower extremity tenderness nor edema.  No joint effusions. Neurologic:  Normal speech and language. No gross focal neurologic deficits are appreciated. No gait instability. Skin:  Skin is warm, dry and intact. No rash noted. Psychiatric: Mood and affect are normal. Speech and behavior are normal.  ____________________________________________   LABS (all labs ordered are listed, but only abnormal results are displayed)  Labs Reviewed  COMPREHENSIVE METABOLIC PANEL - Abnormal; Notable for the following:    Glucose, Bld  110 (*)    All other components within normal limits  URINALYSIS COMPLETEWITH MICROSCOPIC ([facility] ONLY) - Abnormal; Notable for the following:    Color, Urine YELLOW (*)    APPearance CLOUDY (*)    Hgb urine dipstick 1+ (*)    Protein, ur 30 (*)    Leukocytes, UA TRACE (*)    Bacteria, UA RARE (*)    Squamous Epithelial / LPF 6-30 (*)    All other components within normal limits  CBC WITH DIFFERENTIAL/PLATELET   ____________________________________________  EKG  none ____________________________________________  RADIOLOGY  CT abdomen and pelvis IMPRESSION: 1. Mild to moderate right-sided hydronephrosis, with prominence of the right ureter to the level of an obstructing 4 mm stone distally at the right vesicoureteral junction. 2. Few small nonobstructing bilateral renal stones measure up to 3 mm in size. 3. Small left renal cyst seen. 4. Moderate hiatal hernia noted. 5. Chronic osseous fusion at T12-L1. Mild degenerative change along the lumbar spine.  ____________________________________________   PROCEDURES  Procedure(s) performed: None  Critical Care performed: No  ____________________________________________   INITIAL  IMPRESSION / ASSESSMENT AND PLAN / ED COURSE  Pertinent labs & imaging results that were available during my care of the patient were reviewed by me and considered in my medical decision making (see chart for details).  Summer Murphy is a 59 y.o. female she has hypertension, diabetes, asthma, recurrent kidney stones who presents for evaluation of sudden onset severe sharp right flank pain radiating to the right lower quadrant today. CT scan shows a 4 mm right UVJ stone which is the mostly because of her symptoms. At this time her pain is very well controlled and she feels much better. Her tachycardia has resolved. Urinalysis does not appear infected. Normal CMP. CBC is still pending but if unremarkable, the patient will be discharged with  expectant management and urology follow-up. I discussed this with her as well as return precautions and she is comfortable with discharge. Dr. Owens Shark will follow up CBC. ____________________________________________   FINAL CLINICAL IMPRESSION(S) / ED DIAGNOSES  Final diagnoses:  Ureterolithiasis  Acute right flank pain      Joanne Gavel, MD 01/11/15 2355

## 2015-01-11 NOTE — ED Notes (Signed)
Pt. States at around 5 pm today rt. Flank pain.  Pt. States hx of kidney stones.

## 2015-01-11 NOTE — ED Notes (Signed)
CT called by RN to report pt has finished contrast.

## 2015-01-11 NOTE — ED Notes (Signed)
Patient transported to CT 

## 2015-01-12 LAB — CBC WITH DIFFERENTIAL/PLATELET
Basophils Absolute: 0.1 10*3/uL (ref 0–0.1)
Basophils Relative: 1 %
Eosinophils Absolute: 0.4 10*3/uL (ref 0–0.7)
Eosinophils Relative: 4 %
HCT: 39.6 % (ref 35.0–47.0)
Hemoglobin: 12.9 g/dL (ref 12.0–16.0)
Lymphocytes Relative: 28 %
Lymphs Abs: 3.2 10*3/uL (ref 1.0–3.6)
MCH: 28.9 pg (ref 26.0–34.0)
MCHC: 32.6 g/dL (ref 32.0–36.0)
MCV: 88.8 fL (ref 80.0–100.0)
Monocytes Absolute: 0.7 10*3/uL (ref 0.2–0.9)
Monocytes Relative: 6 %
Neutro Abs: 6.9 10*3/uL — ABNORMAL HIGH (ref 1.4–6.5)
Neutrophils Relative %: 61 %
Platelets: 303 10*3/uL (ref 150–440)
RBC: 4.46 MIL/uL (ref 3.80–5.20)
RDW: 14.9 % — ABNORMAL HIGH (ref 11.5–14.5)
WBC: 11.2 10*3/uL — ABNORMAL HIGH (ref 3.6–11.0)

## 2015-01-15 DIAGNOSIS — Z79891 Long term (current) use of opiate analgesic: Secondary | ICD-10-CM | POA: Diagnosis not present

## 2015-01-16 DIAGNOSIS — I10 Essential (primary) hypertension: Secondary | ICD-10-CM | POA: Diagnosis not present

## 2015-01-16 DIAGNOSIS — J4540 Moderate persistent asthma, uncomplicated: Secondary | ICD-10-CM | POA: Diagnosis not present

## 2015-01-16 DIAGNOSIS — B351 Tinea unguium: Secondary | ICD-10-CM | POA: Diagnosis not present

## 2015-01-16 DIAGNOSIS — J301 Allergic rhinitis due to pollen: Secondary | ICD-10-CM | POA: Diagnosis not present

## 2015-01-16 DIAGNOSIS — M545 Low back pain: Secondary | ICD-10-CM | POA: Diagnosis not present

## 2015-01-29 IMAGING — DX DG CHEST 1V PORT
1 series · 1 of 1 positions shown · non-contrast
Comparison: none

REASON FOR EXAM: Shortness of Breath
COMMENTS:

[ap]
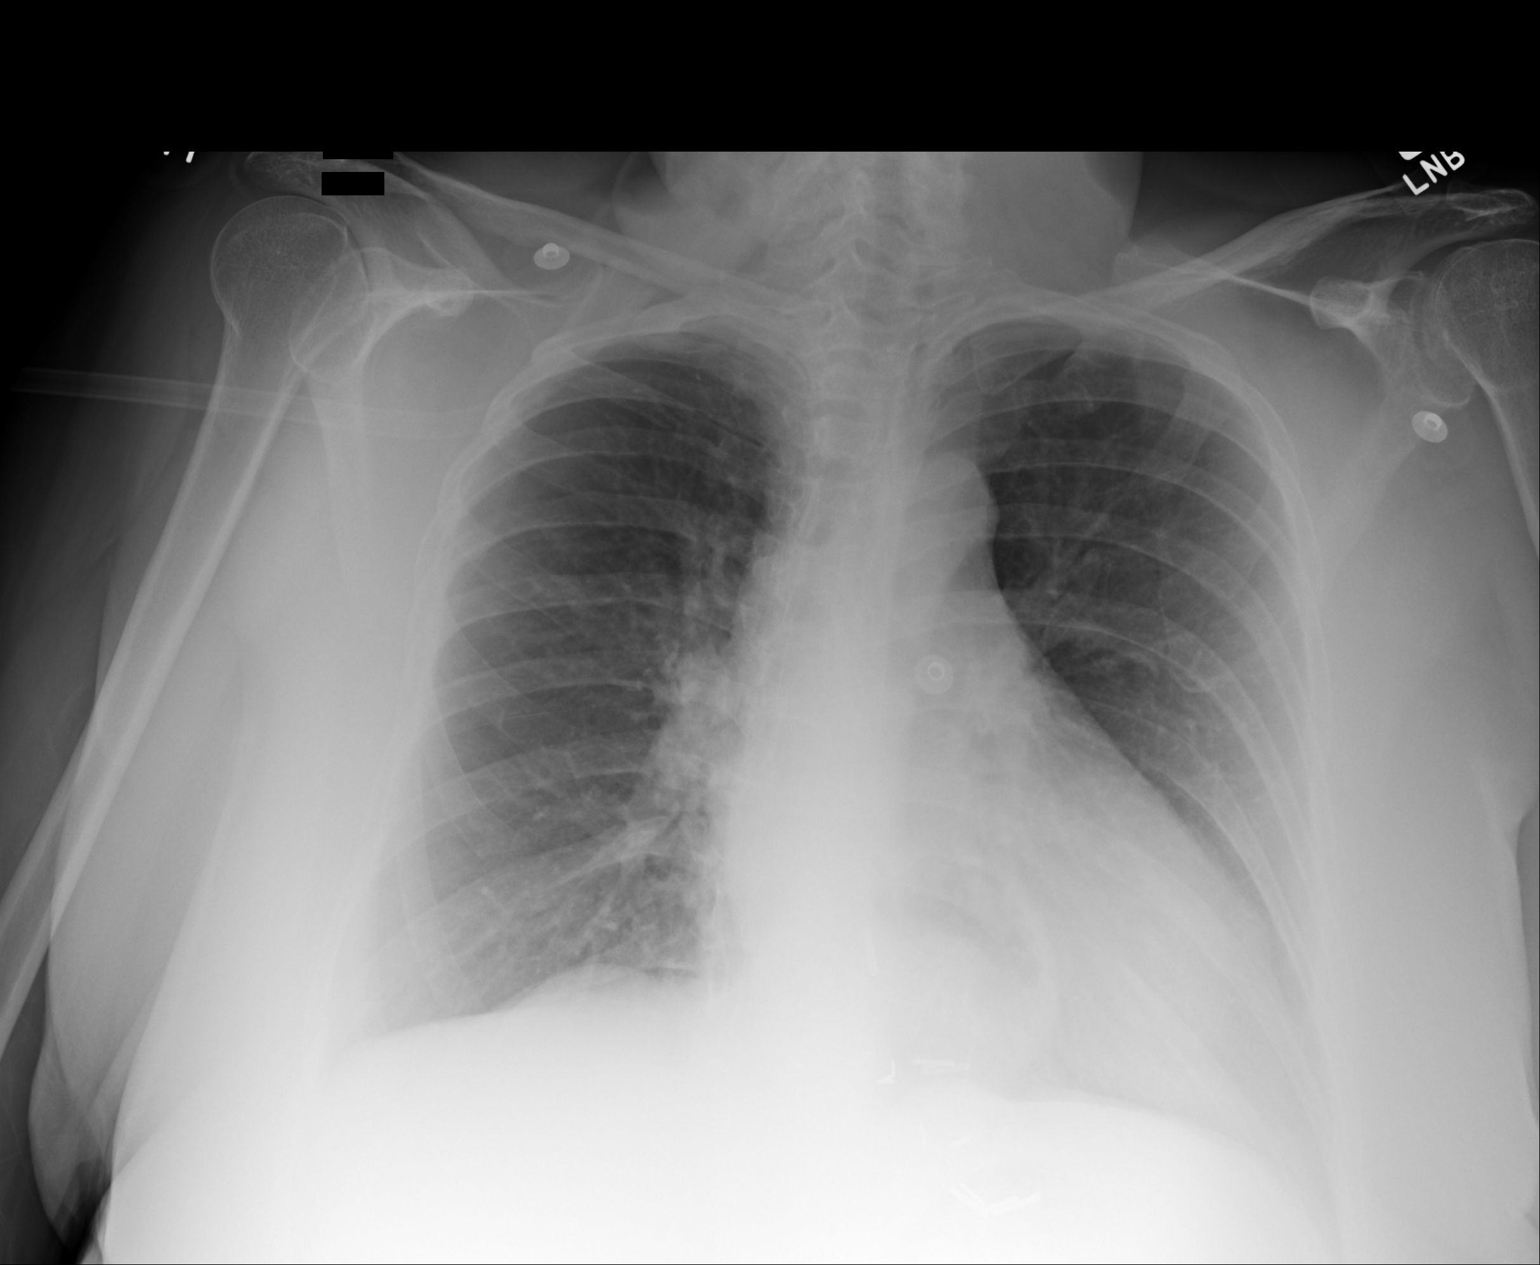

[1 of 1 positions shown; findings below may reference images not displayed]

PROCEDURE:     DXR - DXR PORTABLE CHEST SINGLE VIEW  - June 29, 2012  [DATE]

RESULT:     The lungs are mildly hyperinflated. The cardiac silhouette is
enlarged. There is a moderate-sized hiatal hernia. The pulmonary vascularity
is mildly prominent centrally though stable. There is no pleural effusion.
IMPRESSION: The findings are consistent with reactive airway disease.
There is no focal pneumonia.

[REDACTED]

## 2015-02-08 DIAGNOSIS — N390 Urinary tract infection, site not specified: Secondary | ICD-10-CM | POA: Diagnosis not present

## 2015-02-08 DIAGNOSIS — F331 Major depressive disorder, recurrent, moderate: Secondary | ICD-10-CM | POA: Diagnosis not present

## 2015-02-08 DIAGNOSIS — R55 Syncope and collapse: Secondary | ICD-10-CM | POA: Diagnosis not present

## 2015-02-11 DIAGNOSIS — E538 Deficiency of other specified B group vitamins: Secondary | ICD-10-CM | POA: Diagnosis not present

## 2015-02-11 DIAGNOSIS — E1165 Type 2 diabetes mellitus with hyperglycemia: Secondary | ICD-10-CM | POA: Diagnosis not present

## 2015-02-11 DIAGNOSIS — D649 Anemia, unspecified: Secondary | ICD-10-CM | POA: Diagnosis not present

## 2015-02-11 DIAGNOSIS — R5383 Other fatigue: Secondary | ICD-10-CM | POA: Diagnosis not present

## 2015-02-11 DIAGNOSIS — D509 Iron deficiency anemia, unspecified: Secondary | ICD-10-CM | POA: Diagnosis not present

## 2015-02-11 DIAGNOSIS — R8582 Anal low risk human papillomavirus (HPV) DNA test positive: Secondary | ICD-10-CM | POA: Diagnosis not present

## 2015-02-11 DIAGNOSIS — R7301 Impaired fasting glucose: Secondary | ICD-10-CM | POA: Diagnosis not present

## 2015-02-11 DIAGNOSIS — R55 Syncope and collapse: Secondary | ICD-10-CM | POA: Diagnosis not present

## 2015-02-13 DIAGNOSIS — R55 Syncope and collapse: Secondary | ICD-10-CM | POA: Diagnosis not present

## 2015-02-27 DIAGNOSIS — R55 Syncope and collapse: Secondary | ICD-10-CM | POA: Diagnosis not present

## 2015-02-28 ENCOUNTER — Emergency Department: Payer: Medicare Other

## 2015-02-28 ENCOUNTER — Emergency Department
Admission: EM | Admit: 2015-02-28 | Discharge: 2015-03-01 | Disposition: A | Discharge status: Home / Self Care | Payer: Medicare Other | Attending: Emergency Medicine | Admitting: Emergency Medicine

## 2015-02-28 ENCOUNTER — Encounter: Payer: Self-pay | Admitting: Urgent Care

## 2015-02-28 DIAGNOSIS — M25532 Pain in left wrist: Secondary | ICD-10-CM | POA: Diagnosis not present

## 2015-02-28 DIAGNOSIS — E119 Type 2 diabetes mellitus without complications: Secondary | ICD-10-CM | POA: Diagnosis not present

## 2015-02-28 DIAGNOSIS — W01198A Fall on same level from slipping, tripping and stumbling with subsequent striking against other object, initial encounter: Secondary | ICD-10-CM | POA: Diagnosis not present

## 2015-02-28 DIAGNOSIS — Y998 Other external cause status: Secondary | ICD-10-CM | POA: Diagnosis not present

## 2015-02-28 DIAGNOSIS — Z7951 Long term (current) use of inhaled steroids: Secondary | ICD-10-CM | POA: Diagnosis not present

## 2015-02-28 DIAGNOSIS — S0003XA Contusion of scalp, initial encounter: Secondary | ICD-10-CM | POA: Insufficient documentation

## 2015-02-28 DIAGNOSIS — Z79899 Other long term (current) drug therapy: Secondary | ICD-10-CM | POA: Insufficient documentation

## 2015-02-28 DIAGNOSIS — Y9389 Activity, other specified: Secondary | ICD-10-CM | POA: Diagnosis not present

## 2015-02-28 DIAGNOSIS — S300XXA Contusion of lower back and pelvis, initial encounter: Secondary | ICD-10-CM | POA: Diagnosis not present

## 2015-02-28 DIAGNOSIS — S6992XA Unspecified injury of left wrist, hand and finger(s), initial encounter: Secondary | ICD-10-CM | POA: Diagnosis present

## 2015-02-28 DIAGNOSIS — M533 Sacrococcygeal disorders, not elsewhere classified: Secondary | ICD-10-CM | POA: Diagnosis not present

## 2015-02-28 DIAGNOSIS — Y92009 Unspecified place in unspecified non-institutional (private) residence as the place of occurrence of the external cause: Secondary | ICD-10-CM | POA: Insufficient documentation

## 2015-02-28 DIAGNOSIS — S0990XA Unspecified injury of head, initial encounter: Secondary | ICD-10-CM | POA: Insufficient documentation

## 2015-02-28 DIAGNOSIS — W19XXXA Unspecified fall, initial encounter: Secondary | ICD-10-CM

## 2015-02-28 DIAGNOSIS — I10 Essential (primary) hypertension: Secondary | ICD-10-CM | POA: Diagnosis not present

## 2015-02-28 DIAGNOSIS — S60212A Contusion of left wrist, initial encounter: Secondary | ICD-10-CM | POA: Diagnosis not present

## 2015-02-28 MED ORDER — OXYCODONE-ACETAMINOPHEN 5-325 MG PO TABS
1.0000 | ORAL_TABLET | Freq: Once | ORAL | Status: AC
  Administered 2015-03-01: 1 via ORAL
  Filled 2015-02-28: qty 1

## 2015-02-28 NOTE — ED Provider Notes (Signed)
[facility] Emergency Department Provider Note  ____________________________________________  Time seen: Approximately 11:34 PM  I have reviewed the triage vital signs and the nursing notes.   HISTORY  Chief Complaint Fall    HPI Summer Murphy is a 59 y.o. female who presents to the ED from home with a chief complaint of fall. Patient states she tripped over her dog ate approximately one hour prior to arrival. She struck the left posterior part of her head; denies LOC. Complains of left wrist and coccyx pain. Patient denies use of anticoagulants. Denies associated symptoms of dizziness, vision changes, neck pain, chest pain, shortness of breath, abdominal pain, nausea, vomiting, bowel or bladder incontinence, numbness/tingling/leg weakness. Nothing makes her symptoms better. Movement makes her symptoms worse.   Past Medical History  Diagnosis Date  . Asthma   . Hypertension   . Kidney stone   . Migraine   . Anxiety   . Kidney stones   . Diabetes mellitus without complication ([place])   . Yeast vaginitis   . Left flank pain     Patient Active Problem List   Diagnosis Date Noted  . Severe recurrent major depression without psychotic features ([place]) 09/10/2014  . COPD (chronic obstructive pulmonary disease) ([place]) 09/10/2014  . Calculi, ureter 04/26/2013  . Corneal graft malfunction 08/17/2012  . Calculus of kidney 04/26/2012  . Renal colic 99991111  . Urge incontinence 04/26/2012  . Diaphragmatic hernia 10/27/2010  . Barrett esophagus 07/07/2010    Past Surgical History  Procedure Laterality Date  . Appendectomy    . Cholecystectomy    . Ureteroscopy    . Laparoscopic hysterectomy    . Lithotripsy      Current Outpatient Rx  Name  Route  Sig  Dispense  Refill  . ALPRAZolam (XANAX) 0.5 MG tablet   Oral   Take 0.5 mg by mouth 2 (two) times daily as needed for anxiety.         Marland Kitchen amitriptyline (ELAVIL) 50 MG tablet   Oral   Take 50 mg  by mouth at bedtime.         . budesonide-formoterol (SYMBICORT) 160-4.5 MCG/ACT inhaler   Inhalation   Inhale 2 puffs into the lungs 2 (two) times daily.         . Calcium Carbonate-Vitamin D (CALCIUM-VITAMIN D) 500-200 MG-UNIT per tablet   Oral   Take 1 tablet by mouth daily.         . cholecalciferol (VITAMIN D) 1000 UNITS tablet   Oral   Take 1,000 Units by mouth daily.         . citalopram (CELEXA) 10 MG tablet   Oral   Take 10 mg by mouth daily.         . diclofenac sodium (VOLTAREN) 1 % GEL   Topical   Apply topically 2 (two) times daily as needed.         . diltiazem (DILACOR XR) 180 MG 24 hr capsule   Oral   Take 180 mg by mouth daily.         . DULoxetine (CYMBALTA) 30 MG capsule   Oral   Take 90 mg by mouth daily. 30 mg in am, 60 mg in pm         . EPINEPHrine 0.3 mg/0.3 mL IJ SOAJ injection   Intramuscular   Inject 0.3 mg into the muscle once.         . estrogens, conjugated, (PREMARIN) 0.45 MG tablet  Oral   Take 0.45 mg by mouth every evening. Take daily for 21 days then do not take for 7 days.         . fluticasone (FLONASE) 50 MCG/ACT nasal spray   Each Nare   Place 2 sprays into both nostrils daily.         Marland Kitchen gabapentin (NEURONTIN) 400 MG capsule   Oral   Take 400 mg by mouth 2 (two) times daily.         . Ipratropium-Albuterol (COMBIVENT) 20-100 MCG/ACT AERS respimat   Inhalation   Inhale 1 puff into the lungs 4 (four) times daily.         Marland Kitchen ipratropium-albuterol (DUONEB) 0.5-2.5 (3) MG/3ML SOLN   Nebulization   Take 3 mLs by nebulization every 4 (four) hours as needed.         Marland Kitchen levocetirizine (XYZAL) 5 MG tablet   Oral   Take 5 mg by mouth daily.         . montelukast (SINGULAIR) 10 MG tablet   Oral   Take 10 mg by mouth at bedtime.         . Multiple Vitamin (MULTIVITAMIN WITH MINERALS) TABS tablet   Oral   Take 1 tablet by mouth daily.         Marland Kitchen omeprazole (PRILOSEC) 20 MG capsule   Oral    Take 20 mg by mouth 2 (two) times daily.         . ondansetron (ZOFRAN ODT) 4 MG disintegrating tablet   Oral   Take 1 tablet (4 mg total) by mouth every 8 (eight) hours as needed for nausea or vomiting.   12 tablet   0   . oxyCODONE (ROXICODONE) 5 MG immediate release tablet   Oral   Take 1 tablet (5 mg total) by mouth every 6 (six) hours as needed for moderate pain. Do not drive while taking this medication.   12 tablet   0   . rizatriptan (MAXALT) 5 MG tablet   Oral   Take 5 mg by mouth as needed for migraine. May repeat in 2 hours if needed         . rOPINIRole (REQUIP) 0.25 MG tablet   Oral   Take 0.25 mg by mouth 2 (two) times daily.         . tamsulosin (FLOMAX) 0.4 MG CAPS capsule   Oral   Take 1 capsule (0.4 mg total) by mouth daily.   14 capsule   0   . tiotropium (SPIRIVA) 18 MCG inhalation capsule   Inhalation   Place 18 mcg into inhaler and inhale daily.         Marland Kitchen topiramate (TOPAMAX) 50 MG tablet   Oral   Take 50 mg by mouth at bedtime.         . traMADol (ULTRAM) 50 MG tablet   Oral   Take 50-100 mg by mouth every 6 (six) hours as needed for moderate pain or severe pain.         . valACYclovir (VALTREX) 500 MG tablet   Oral   Take 500 mg by mouth 2 (two) times daily. For 5 days when you have a fever blister.           Allergies Librium; Nsaids; Vanilla; Aspirin; Azithromycin; Chlordiazepoxide hcl; Iron; Morphine and related; Reglan; and Sulfa antibiotics  Family History  Problem Relation Age of Onset  . Prostate cancer Father   . Kidney Stones Father  Social History Social History  Substance Use Topics  . Smoking status: Never Smoker   . Smokeless tobacco: None  . Alcohol Use: No    Review of Systems Constitutional: No fever/chills Eyes: No visual changes. ENT: No sore throat. Cardiovascular: Denies chest pain. Respiratory: Denies shortness of breath. Gastrointestinal: No abdominal pain.  No nausea, no vomiting.  No  diarrhea.  No constipation. Genitourinary: Negative for dysuria. Musculoskeletal: Positive for coccyx pain. Positive for left wrist pain. Negative for back pain. Skin: Negative for rash. Neurological: Positive for head injury. Negative for focal weakness or numbness.  10-point ROS otherwise negative.  ____________________________________________   PHYSICAL EXAM:  VITAL SIGNS: ED Triage Vitals  Enc Vitals Group     BP 02/28/15 2315 144/87 mmHg     Pulse Rate 02/28/15 2315 115     Resp 02/28/15 2315 20     Temp 02/28/15 2315 98.7 F (37.1 C)     Temp Source 02/28/15 2315 Oral     SpO2 02/28/15 2315 95 %     Weight 02/28/15 2315 163 lb (73.936 kg)     Height 02/28/15 2315 5\' 3"  (1.6 m)     Head Cir --      Peak Flow --      Pain Score 02/28/15 2316 8     Pain Loc --      Pain Edu? --      Excl. in [place]? --     Constitutional: Alert and oriented. Well appearing and in mild acute distress. Eyes: Conjunctivae are normal. PERRL. EOMI. Head: Small hematoma to left posterior scalp. Nose: No congestion/rhinnorhea. Mouth/Throat: Mucous membranes are moist.  Oropharynx non-erythematous.  No malocclusion. Neck: No stridor.  No cervical spine tenderness to palpation.  No step-offs or deformities. Cardiovascular: Normal rate, regular rhythm. Grossly normal heart sounds.  Good peripheral circulation. Respiratory: Normal respiratory effort.  No retractions. Lungs CTAB. Gastrointestinal: Soft and nontender. No distention. No abdominal bruits. No CVA tenderness. Musculoskeletal: Coccyx tender to palpation. No lower extremity tenderness nor edema.  No joint effusions. Full range of motion bilateral hips without pain. Left wrist with mild swelling at the ulnar styloid without deformity. Tender to palpation and limited range of motion left wrist secondary to pain. 2+ radial pulses. 5/5 motor strength and sensation. Neurologic:  Normal speech and language. No gross focal neurologic deficits are  appreciated. Ambulatory with a shuffling gait secondary to pain in coccyx. Skin:  Skin is warm, dry and intact. No rash noted. Psychiatric: Mood and affect are normal. Speech and behavior are normal.  ____________________________________________   LABS (all labs ordered are listed, but only abnormal results are displayed)  Labs Reviewed - No data to display ____________________________________________  EKG  None ____________________________________________  RADIOLOGY  CT head without contrast interpreted per Dr. Quintella Reichert: No acute intracranial hemorrhage.  Mild age-related atrophy and chronic microvascular ischemic disease.  Left wrist complete (viewed by me, interpreted per Dr. Radene Knee): No evidence of fracture or dislocation.  Sacrum/coccyx (viewed by me, interpreted per Dr. Quintella Reichert): No acute fracture. ____________________________________________   PROCEDURES  Procedure(s) performed: None  Critical Care performed: No  ____________________________________________   INITIAL IMPRESSION / ASSESSMENT AND PLAN / ED COURSE  Pertinent labs & imaging results that were available during my care of the patient were reviewed by me and considered in my medical decision making (see chart for details).  59 year old female s/p mechanical fall with closed head injury, left wrist and coccyx pain. Will obtain imaging studies, administer analgesics and  reassess.  ----------------------------------------- 1:10 AM on 03/01/2015 -----------------------------------------  Updated patient of negative imaging studies. Will place in Velcro wrist splint. Plan for analgesia and orthopedics follow-up early next week. Strict return precautions given. Patient verbalizes understanding agrees with plan of care. ____________________________________________   FINAL CLINICAL IMPRESSION(S) / ED DIAGNOSES  Final diagnoses:  Fall, initial encounter  Coccyx contusion, initial encounter  Wrist  contusion, left, initial encounter      Paulette Blanch, MD 03/01/15 [phone]

## 2015-02-28 NOTE — ED Notes (Signed)
Patient states she fell while stepping over the dog gate at home and hit her left side on vinyl flooring. Patient c/o left wrist pain, sacral pain and left side of head pain.

## 2015-02-28 NOTE — ED Notes (Signed)
Patient presents with c/o pain s/p fall. Patient advising that she tripped over a dog gate and fell to the floor; struck head. Patient with c/o pain to RIGHT hip, coccyx area, and LEFT wrist. Patient reports that she hit her head hard on a concrete floor; c/o LEFT facial numbness - "it doesn't hurt. I can feel it, but it feels tight and swollen, which is making it feel numb."

## 2015-02-28 NOTE — ED Notes (Signed)
No bruising noted on sacrum. Patient moves easily and assisted with undressing. Patient is guarding left wrist.

## 2015-03-01 DIAGNOSIS — S60212A Contusion of left wrist, initial encounter: Secondary | ICD-10-CM | POA: Diagnosis not present

## 2015-03-01 DIAGNOSIS — M25532 Pain in left wrist: Secondary | ICD-10-CM | POA: Diagnosis not present

## 2015-03-01 DIAGNOSIS — S0990XA Unspecified injury of head, initial encounter: Secondary | ICD-10-CM | POA: Diagnosis not present

## 2015-03-01 DIAGNOSIS — M533 Sacrococcygeal disorders, not elsewhere classified: Secondary | ICD-10-CM | POA: Diagnosis not present

## 2015-03-01 MED ORDER — OXYCODONE-ACETAMINOPHEN 5-325 MG PO TABS: 1.0000 | ORAL_TABLET | ORAL | Status: DC | PRN

## 2015-03-01 MED ORDER — ONDANSETRON HCL 4 MG PO TABS: 4.0000 mg | ORAL_TABLET | Freq: Three times a day (TID) | ORAL | Status: DC | PRN

## 2015-03-01 MED ORDER — ONDANSETRON 4 MG PO TBDP
4.0000 mg | ORAL_TABLET | Freq: Once | ORAL | Status: AC
  Administered 2015-03-01: 4 mg via ORAL
  Filled 2015-03-01: qty 1

## 2015-03-01 MED ORDER — ONDANSETRON HCL 4 MG/2ML IJ SOLN: 4.0000 mg | Freq: Once | INTRAMUSCULAR | Status: DC

## 2015-03-01 MED ORDER — ONDANSETRON HCL 4 MG PO TABS: 4.0000 mg | ORAL_TABLET | Freq: Once | ORAL | Status: DC

## 2015-03-01 NOTE — ED Notes (Signed)
Patient is off the floor to imaging at this time. 

## 2015-03-01 NOTE — ED Notes (Signed)
Reviewed d/c instructions, medications/prescriptions, follow-up care, use of ice and elevation with pt.  Pt verbalized understanding.

## 2015-03-01 NOTE — Discharge Instructions (Signed)
1. Take pain and nausea medicines as needed (Percocet/Zofran #15). 2. Wear Velcro wrist splint as needed for comfort. 3. Apply ice to affected area several times daily. 4. Return to the ER for worsening symptoms, persistent vomiting, difficulty breathing or other concerns.  Tailbone Injury The tailbone (coccyx) is the small bone at the lower end of the spine. A tailbone injury may involve stretched ligaments, bruising, or a broken bone (fracture). Tailbone injuries can be painful, and some may take a long time to heal. CAUSES This condition is often caused by falling and landing on the tailbone. Other causes include:  Repeated strain or friction from actions such as rowing and bicycling.  Childbirth. In some cases, the cause may not be known. RISK FACTORS This condition is more common in women than in men. SYMPTOMS Symptoms of this condition include:  Pain in the lower back, especially when sitting.  Pain or difficulty when standing up from a sitting position.  Bruising in the tailbone area.  Painful bowel movements.  In women, pain during intercourse. DIAGNOSIS This condition may be diagnosed based on your symptoms and a physical exam. X-rays may be taken if a fracture is suspected. You may also have other tests, such as a CT scan or MRI. TREATMENT This condition may be treated with medicines to help relieve your pain. Most tailbone injuries heal on their own in 4-6 weeks. However, recovery time may be longer if the injury involves a fracture. HOME CARE INSTRUCTIONS  Take medicines only as directed by your health care provider.  If directed, apply ice to the injured area:  Put ice in a plastic bag.  Place a towel between your skin and the bag.  Leave the ice on for 20 minutes, 2-3 times per day for the first 1-2 days.  Sit on a large, rubber or inflated ring or cushion to ease your pain. Lean forward when you are sitting to help decrease discomfort.  Avoid sitting for  long periods of time.  Increase your activity as the pain allows. Perform any exercises that are recommended by your health care provider or physical therapist.  If you have pain during bowel movements, use stool softeners as directed by your health care provider.  Eat a diet that includes plenty of fiber to help prevent constipation.  Keep all follow-up visits as directed by your health care provider. This is important. PREVENTION Wear appropriate padding and sports gear when bicycling and rowing. This can help to prevent developing an injury that is caused by repeated strain or friction. SEEK MEDICAL CARE IF:  Your pain becomes worse.  Your bowel movements cause a great deal of discomfort.  You are unable to have a bowel movement.  You have uncontrolled urine loss (urinary incontinence).  You have a fever.   This information is not intended to replace advice given to you by your health care provider. Make sure you discuss any questions you have with your health care provider.   Document Released: 03/06/2000 Document Revised: 07/24/2014 Document Reviewed: 03/05/2014 Elsevier Interactive Patient Education 2016 [place] A contusion is a deep bruise. Contusions are the result of a blunt injury to tissues and muscle fibers under the skin. The injury causes bleeding under the skin. The skin overlying the contusion may turn blue, purple, or yellow. Minor injuries will give you a painless contusion, but more severe contusions may stay painful and swollen for a few weeks.  CAUSES  This condition is usually caused by a  blow, trauma, or direct force to an area of the body. SYMPTOMS  Symptoms of this condition include:  Swelling of the injured area.  Pain and tenderness in the injured area.  Discoloration. The area may have redness and then turn blue, purple, or yellow. DIAGNOSIS  This condition is diagnosed based on a physical exam and medical history. An X-ray, CT  scan, or MRI may be needed to determine if there are any associated injuries, such as broken bones (fractures). TREATMENT  Specific treatment for this condition depends on what area of the body was injured. In general, the best treatment for a contusion is resting, icing, applying pressure to (compression), and elevating the injured area. This is often called the RICE strategy. Over-the-counter anti-inflammatory medicines may also be recommended for pain control.  HOME CARE INSTRUCTIONS   Rest the injured area.  If directed, apply ice to the injured area:  Put ice in a plastic bag.  Place a towel between your skin and the bag.  Leave the ice on for 20 minutes, 2-3 times per day.  If directed, apply light compression to the injured area using an elastic bandage. Make sure the bandage is not wrapped too tightly. Remove and reapply the bandage as directed by your health care provider.  If possible, raise (elevate) the injured area above the level of your heart while you are sitting or lying down.  Take over-the-counter and prescription medicines only as told by your health care provider. SEEK MEDICAL CARE IF:  Your symptoms do not improve after several days of treatment.  Your symptoms get worse.  You have difficulty moving the injured area. SEEK IMMEDIATE MEDICAL CARE IF:   You have severe pain.  You have numbness in a hand or foot.  Your hand or foot turns pale or cold.   This information is not intended to replace advice given to you by your health care provider. Make sure you discuss any questions you have with your health care provider.   Document Released: 12/17/2004 Document Revised: 11/28/2014 Document Reviewed: 07/25/2014 Elsevier Interactive Patient Education [employer].

## 2015-03-01 NOTE — ED Notes (Signed)
Pt. Stated she would not have a ride until 4 am.

## 2015-03-07 ENCOUNTER — Other Ambulatory Visit: Payer: Self-pay | Admitting: Nurse Practitioner

## 2015-03-07 DIAGNOSIS — G2581 Restless legs syndrome: Secondary | ICD-10-CM | POA: Diagnosis not present

## 2015-03-07 DIAGNOSIS — R55 Syncope and collapse: Secondary | ICD-10-CM | POA: Diagnosis not present

## 2015-03-07 DIAGNOSIS — R52 Pain, unspecified: Secondary | ICD-10-CM

## 2015-04-16 DIAGNOSIS — Z79899 Other long term (current) drug therapy: Secondary | ICD-10-CM | POA: Diagnosis not present

## 2015-04-16 DIAGNOSIS — Z886 Allergy status to analgesic agent status: Secondary | ICD-10-CM | POA: Diagnosis not present

## 2015-04-16 DIAGNOSIS — K228 Other specified diseases of esophagus: Secondary | ICD-10-CM | POA: Diagnosis not present

## 2015-04-16 DIAGNOSIS — Z947 Corneal transplant status: Secondary | ICD-10-CM | POA: Diagnosis not present

## 2015-04-16 DIAGNOSIS — I10 Essential (primary) hypertension: Secondary | ICD-10-CM | POA: Diagnosis not present

## 2015-04-16 DIAGNOSIS — K2930 Chronic superficial gastritis without bleeding: Secondary | ICD-10-CM | POA: Diagnosis not present

## 2015-04-16 DIAGNOSIS — M1380 Other specified arthritis, unspecified site: Secondary | ICD-10-CM | POA: Diagnosis not present

## 2015-04-16 DIAGNOSIS — K449 Diaphragmatic hernia without obstruction or gangrene: Secondary | ICD-10-CM | POA: Diagnosis not present

## 2015-04-16 DIAGNOSIS — Z09 Encounter for follow-up examination after completed treatment for conditions other than malignant neoplasm: Secondary | ICD-10-CM | POA: Diagnosis not present

## 2015-04-16 DIAGNOSIS — K219 Gastro-esophageal reflux disease without esophagitis: Secondary | ICD-10-CM | POA: Diagnosis not present

## 2015-04-16 DIAGNOSIS — J45909 Unspecified asthma, uncomplicated: Secondary | ICD-10-CM | POA: Diagnosis not present

## 2015-04-18 DIAGNOSIS — R1013 Epigastric pain: Secondary | ICD-10-CM | POA: Diagnosis not present

## 2015-04-18 DIAGNOSIS — K2970 Gastritis, unspecified, without bleeding: Secondary | ICD-10-CM | POA: Diagnosis not present

## 2015-05-20 DIAGNOSIS — R5383 Other fatigue: Secondary | ICD-10-CM | POA: Diagnosis not present

## 2015-05-20 DIAGNOSIS — E43 Unspecified severe protein-calorie malnutrition: Secondary | ICD-10-CM | POA: Diagnosis not present

## 2015-05-21 DIAGNOSIS — R5383 Other fatigue: Secondary | ICD-10-CM | POA: Diagnosis not present

## 2015-05-21 DIAGNOSIS — E46 Unspecified protein-calorie malnutrition: Secondary | ICD-10-CM | POA: Diagnosis not present

## 2015-05-21 DIAGNOSIS — D519 Vitamin B12 deficiency anemia, unspecified: Secondary | ICD-10-CM | POA: Diagnosis not present

## 2015-06-10 DIAGNOSIS — J301 Allergic rhinitis due to pollen: Secondary | ICD-10-CM | POA: Diagnosis not present

## 2015-06-10 DIAGNOSIS — R0602 Shortness of breath: Secondary | ICD-10-CM | POA: Diagnosis not present

## 2015-06-10 DIAGNOSIS — J069 Acute upper respiratory infection, unspecified: Secondary | ICD-10-CM | POA: Diagnosis not present

## 2015-06-10 DIAGNOSIS — I10 Essential (primary) hypertension: Secondary | ICD-10-CM | POA: Diagnosis not present

## 2015-06-18 ENCOUNTER — Emergency Department: Payer: Medicare Other

## 2015-06-18 ENCOUNTER — Emergency Department
Admission: EM | Admit: 2015-06-18 | Discharge: 2015-06-18 | Disposition: A | Discharge status: Home / Self Care | Payer: Medicare Other | Attending: Emergency Medicine | Admitting: Emergency Medicine

## 2015-06-18 ENCOUNTER — Encounter: Payer: Self-pay | Admitting: Emergency Medicine

## 2015-06-18 DIAGNOSIS — F410 Panic disorder [episodic paroxysmal anxiety] without agoraphobia: Secondary | ICD-10-CM | POA: Diagnosis not present

## 2015-06-18 DIAGNOSIS — E119 Type 2 diabetes mellitus without complications: Secondary | ICD-10-CM | POA: Diagnosis not present

## 2015-06-18 DIAGNOSIS — N200 Calculus of kidney: Secondary | ICD-10-CM | POA: Diagnosis not present

## 2015-06-18 DIAGNOSIS — R109 Unspecified abdominal pain: Secondary | ICD-10-CM | POA: Diagnosis not present

## 2015-06-18 DIAGNOSIS — I10 Essential (primary) hypertension: Secondary | ICD-10-CM | POA: Insufficient documentation

## 2015-06-18 DIAGNOSIS — F419 Anxiety disorder, unspecified: Secondary | ICD-10-CM | POA: Diagnosis not present

## 2015-06-18 DIAGNOSIS — J441 Chronic obstructive pulmonary disease with (acute) exacerbation: Secondary | ICD-10-CM | POA: Insufficient documentation

## 2015-06-18 LAB — CBC WITH DIFFERENTIAL/PLATELET
Basophils Absolute: 0.1 10*3/uL (ref 0–0.1)
Basophils Relative: 1 %
Eosinophils Absolute: 0.3 10*3/uL (ref 0–0.7)
Eosinophils Relative: 4 %
HCT: 36.6 % (ref 35.0–47.0)
Hemoglobin: 12.1 g/dL (ref 12.0–16.0)
Lymphocytes Relative: 27 %
Lymphs Abs: 1.9 10*3/uL (ref 1.0–3.6)
MCH: 28.2 pg (ref 26.0–34.0)
MCHC: 33.1 g/dL (ref 32.0–36.0)
MCV: 85.3 fL (ref 80.0–100.0)
Monocytes Absolute: 0.5 10*3/uL (ref 0.2–0.9)
Monocytes Relative: 6 %
Neutro Abs: 4.4 10*3/uL (ref 1.4–6.5)
Neutrophils Relative %: 62 %
Platelets: 268 10*3/uL (ref 150–440)
RBC: 4.29 MIL/uL (ref 3.80–5.20)
RDW: 16.1 % — ABNORMAL HIGH (ref 11.5–14.5)
WBC: 7.2 10*3/uL (ref 3.6–11.0)

## 2015-06-18 LAB — URINALYSIS COMPLETE WITH MICROSCOPIC (ARMC ONLY)
Bacteria, UA: NONE SEEN
Bilirubin Urine: NEGATIVE
Glucose, UA: NEGATIVE mg/dL
Hgb urine dipstick: NEGATIVE
Ketones, ur: NEGATIVE mg/dL
Leukocytes, UA: NEGATIVE
Nitrite: NEGATIVE
Protein, ur: 30 mg/dL — AB
Specific Gravity, Urine: 1.023 (ref 1.005–1.030)
pH: 5 (ref 5.0–8.0)

## 2015-06-18 LAB — COMPREHENSIVE METABOLIC PANEL
ALT: 35 U/L (ref 14–54)
AST: 24 U/L (ref 15–41)
Albumin: 3.5 g/dL (ref 3.5–5.0)
Alkaline Phosphatase: 74 U/L (ref 38–126)
Anion gap: 5 (ref 5–15)
BUN: 13 mg/dL (ref 6–20)
CO2: 22 mmol/L (ref 22–32)
Calcium: 8.6 mg/dL — ABNORMAL LOW (ref 8.9–10.3)
Chloride: 109 mmol/L (ref 101–111)
Creatinine, Ser: 0.63 mg/dL (ref 0.44–1.00)
GFR calc Af Amer: >60 mL/min (ref >60)
GFR calc non Af Amer: >60 mL/min (ref >60)
Glucose, Bld: 85 mg/dL (ref 65–99)
Potassium: 3.5 mmol/L (ref 3.5–5.1)
Sodium: 136 mmol/L (ref 135–145)
Total Bilirubin: 0.4 mg/dL (ref 0.3–1.2)
Total Protein: 6.8 g/dL (ref 6.5–8.1)

## 2015-06-18 MED ORDER — LORAZEPAM 2 MG/ML IJ SOLN
1.0000 mg | Freq: Once | INTRAMUSCULAR | Status: AC
  Administered 2015-06-18: 1 mg via INTRAVENOUS
  Filled 2015-06-18: qty 1

## 2015-06-18 MED ORDER — IPRATROPIUM-ALBUTEROL 0.5-2.5 (3) MG/3ML IN SOLN
3.0000 mL | Freq: Once | RESPIRATORY_TRACT | Status: AC
  Administered 2015-06-18: 3 mL via RESPIRATORY_TRACT
  Filled 2015-06-18: qty 3

## 2015-06-18 MED ORDER — KETOROLAC TROMETHAMINE 30 MG/ML IJ SOLN
30.0000 mg | Freq: Once | INTRAMUSCULAR | Status: AC
  Administered 2015-06-18: 30 mg via INTRAVENOUS
  Filled 2015-06-18: qty 1

## 2015-06-18 MED ORDER — DIPHENHYDRAMINE HCL 50 MG/ML IJ SOLN
25.0000 mg | Freq: Once | INTRAMUSCULAR | Status: DC
Start: 2015-06-18 — End: 2015-06-18

## 2015-06-18 MED ORDER — LORAZEPAM 1 MG PO TABS: 1.0000 mg | ORAL_TABLET | Freq: Two times a day (BID) | ORAL | Status: DC

## 2015-06-18 MED ORDER — CYCLOBENZAPRINE HCL 10 MG PO TABS: 10.0000 mg | ORAL_TABLET | Freq: Three times a day (TID) | ORAL | Status: DC | PRN

## 2015-06-18 MED ORDER — PREDNISONE 10 MG (21) PO TBPK: 10.0000 mg | ORAL_TABLET | Freq: Every day | ORAL | Status: DC

## 2015-06-18 MED ORDER — OXYCODONE-ACETAMINOPHEN 5-325 MG PO TABS
1.0000 | ORAL_TABLET | ORAL | Status: DC | PRN
  Administered 2015-06-18: 1 via ORAL
  Filled 2015-06-18: qty 1

## 2015-06-18 NOTE — ED Provider Notes (Signed)
[facility] Emergency Department Provider Note     Time seen: ----------------------------------------- 1:11 PM on 06/18/2015 -----------------------------------------    I have reviewed the triage vital signs and the nursing notes.   HISTORY  Chief Complaint Flank Pain    HPI Summer Murphy is a 60 y.o. female presents to ER for left flank pain for the last 2 days has been increasing. Patient denies any urination issue, is states feels like there is a needle in her back. She reports a history of kidney stones and this feels like same. She has had cough and congestion otherwise denies any recent illness. Nothing makes her symptoms better or worse.   Past Medical History  Diagnosis Date  . Asthma   . Hypertension   . Kidney stone   . Migraine   . Anxiety   . Kidney stones   . Diabetes mellitus without complication ([place])   . Yeast vaginitis   . Left flank pain     Patient Active Problem List   Diagnosis Date Noted  . Severe recurrent major depression without psychotic features ([place]) 09/10/2014  . COPD (chronic obstructive pulmonary disease) ([place]) 09/10/2014  . Calculi, ureter 04/26/2013  . Corneal graft malfunction 08/17/2012  . Calculus of kidney 04/26/2012  . Renal colic 99991111  . Urge incontinence 04/26/2012  . Diaphragmatic hernia 10/27/2010  . Barrett esophagus 07/07/2010    Past Surgical History  Procedure Laterality Date  . Appendectomy    . Cholecystectomy    . Ureteroscopy    . Laparoscopic hysterectomy    . Lithotripsy      Allergies Librium; Nsaids; Vanilla; Aspirin; Azithromycin; Chlordiazepoxide hcl; Iron; Morphine and related; Reglan; and Sulfa antibiotics  Social History Social History  Substance Use Topics  . Smoking status: Never Smoker   . Smokeless tobacco: None  . Alcohol Use: No    Review of Systems Constitutional: Negative for fever. Eyes: Negative for visual changes. ENT: Negative for sore  throat. Cardiovascular: Negative for chest pain. Respiratory: Positive for cough and congestion Gastrointestinal: Positive for left flank pain Genitourinary: Negative for dysuria. Musculoskeletal: Negative for back pain. Skin: Negative for rash. Neurological: Negative for headaches, focal weakness or numbness.  10-point ROS otherwise negative.  ____________________________________________   PHYSICAL EXAM:  VITAL SIGNS: ED Triage Vitals  Enc Vitals Group     BP 06/18/15 1213 129/96 mmHg     Pulse Rate 06/18/15 1213 122     Resp 06/18/15 1213 22     Temp 06/18/15 1213 98.1 F (36.7 C)     Temp Source 06/18/15 1213 Oral     SpO2 06/18/15 1213 96 %     Weight 06/18/15 1213 141 lb (63.957 kg)     Height 06/18/15 1213 5\' 3"  (1.6 m)     Head Cir --      Peak Flow --      Pain Score 06/18/15 1213 9     Pain Loc --      Pain Edu? --      Excl. in [place]? --     Constitutional: Alert and oriented. Mild distress, anxious Eyes: Conjunctivae are normal. PERRL. Normal extraocular movements. ENT   Head: Normocephalic and atraumatic.   Nose: No congestion/rhinnorhea.   Mouth/Throat: Mucous membranes are moist.   Neck: No stridor. Cardiovascular: Normal rate, regular rhythm. Normal and symmetric distal pulses are present in all extremities. No murmurs, rubs, or gallops. Respiratory: Tachypnea with wheezing bilaterally Gastrointestinal: Nonspecific left flank tenderness, no rebound or  guarding. Normal bowel sounds. Musculoskeletal: Nontender with normal range of motion in all extremities. No joint effusions.  No lower extremity tenderness nor edema. Neurologic:  Normal speech and language. No gross focal neurologic deficits are appreciated.  Skin:  Skin is warm, dry and intact. No rash noted. Psychiatric: Mood and affect are normal. Speech and behavior are normal. Patient exhibits appropriate insight and judgment. ___________________________________________  ED  COURSE:  Pertinent labs & imaging results that were available during my care of the patient were reviewed by me and considered in my medical decision making (see chart for details). Patient appears anxious, unclear if this is renal colic or not. We'll obtain basic labs and reevaluate. ____________________________________________    LABS (pertinent positives/negatives)  Labs Reviewed  URINALYSIS COMPLETEWITH MICROSCOPIC ([facility] ONLY) - Abnormal; Notable for the following:    Color, Urine AMBER (*)    APPearance HAZY (*)    Protein, ur 30 (*)    Squamous Epithelial / LPF 0-5 (*)    All other components within normal limits  CBC WITH DIFFERENTIAL/PLATELET - Abnormal; Notable for the following:    RDW 16.1 (*)    All other components within normal limits  COMPREHENSIVE METABOLIC PANEL - Abnormal; Notable for the following:    Calcium 8.6 (*)    All other components within normal limits    RADIOLOGY Images were viewed by me  CT renal protocol IMPRESSION: Moderate hiatal hernia, stable. Surgical clips in the vicinity of the hiatal hernia and gastroesophageal junction region or stable.  Small nonobstructing calculi in each kidney. No ureteral calculus or hydronephrosis on either side.  Fatty infiltration in the pancreas, a finding that is stable and may be seen with diabetes mellitus.  Scattered subcentimeter mesenteric lymph nodes, regarded as nonspecific. No adenopathy is noted on this study by size criteria.  Gallbladder, uterus, and appendix are absent. No bowel obstruction. No abscess. ____________________________________________  FINAL ASSESSMENT AND PLAN  Flank pain, anxiety  Plan: Patient with labs and imaging as dictated above. Patient is in no acute distress, CT is unremarkable. I will advise NSAIDs and Flexeril for her symptoms. She'll be placed on prednisone for her wheezing. She stable for discharge.   [name], MD   [name],  MD 06/18/15 [phone]

## 2015-06-18 NOTE — ED Notes (Signed)
Patient presents to the ED with left flank pain x 2 days that has been increasing.  Patient denies any urination issues.  Patient states, "it feels like someone's got a needle in my back."  Patient reports history of kidney stones.  Patient reports pain feels similar to previous stone.

## 2015-06-18 NOTE — Discharge Instructions (Signed)
Flank Pain Flank pain refers to pain that is located on the side of the body between the upper abdomen and the back. The pain may occur over a short period of time (acute) or may be long-term or reoccurring (chronic). It may be mild or severe. Flank pain can be caused by many things. CAUSES  Some of the more common causes of flank pain include:  Muscle strains.   Muscle spasms.   A disease of your spine (vertebral disk disease).   A lung infection (pneumonia).   Fluid around your lungs (pulmonary edema).   A kidney infection.   Kidney stones.   A very painful skin rash caused by the chickenpox virus (shingles).   Gallbladder disease.  [place] care will depend on the cause of your pain. In general,  Rest as directed by your caregiver.  Drink enough fluids to keep your urine clear or pale yellow.  Only take over-the-counter or prescription medicines as directed by your caregiver. Some medicines may help relieve the pain.  Tell your caregiver about any changes in your pain.  Follow up with your caregiver as directed. SEEK IMMEDIATE MEDICAL CARE IF:   Your pain is not controlled with medicine.   You have new or worsening symptoms.  Your pain increases.   You have abdominal pain.   You have shortness of breath.   You have persistent nausea or vomiting.   You have swelling in your abdomen.   You feel faint or pass out.   You have blood in your urine.  You have a fever or persistent symptoms for more than 2-3 days.  You have a fever and your symptoms suddenly get worse. MAKE SURE YOU:   Understand these instructions.  Will watch your condition.  Will get help right away if you are not doing well or get worse.   This information is not intended to replace advice given to you by your health care provider. Make sure you discuss any questions you have with your health care provider.   Document Released: 04/30/2005 Document  Revised: 12/02/2011 Document Reviewed: 10/22/2011 Elsevier Interactive Patient Education 2016 [employer]  Panic Attacks Panic attacks are sudden, short-livedsurges of severe anxiety, fear, or discomfort. They may occur for no reason when you are relaxed, when you are anxious, or when you are sleeping. Panic attacks may occur for a number of reasons:   Healthy people occasionally have panic attacks in extreme, life-threatening situations, such as war or natural disasters. Normal anxiety is a protective mechanism of the body that helps [place] react to danger (fight or flight response).  Panic attacks are often seen with anxiety disorders, such as panic disorder, social anxiety disorder, generalized anxiety disorder, and phobias. Anxiety disorders cause excessive or uncontrollable anxiety. They may interfere with your relationships or other life activities.  Panic attacks are sometimes seen with other mental illnesses, such as depression and posttraumatic stress disorder.  Certain medical conditions, prescription medicines, and drugs of abuse can cause panic attacks. SYMPTOMS  Panic attacks start suddenly, peak within 20 minutes, and are accompanied by four or more of the following symptoms:  Pounding heart or fast heart rate (palpitations).  Sweating.  Trembling or shaking.  Shortness of breath or feeling smothered.  Feeling choked.  Chest pain or discomfort.  Nausea or strange feeling in your stomach.  Dizziness, light-headedness, or feeling like you will faint.  Chills or hot flushes.  Numbness or tingling in your lips or hands and  feet.  Feeling that things are not real or feeling that you are not yourself.  Fear of losing control or going crazy.  Fear of dying. Some of these symptoms can mimic serious medical conditions. For example, you may think you are having a heart attack. Although panic attacks can be very scary, they are not life threatening. DIAGNOSIS  Panic  attacks are diagnosed through an assessment by your health care provider. Your health care provider will ask questions about your symptoms, such as where and when they occurred. Your health care provider will also ask about your medical history and use of alcohol and drugs, including prescription medicines. Your health care provider may order blood tests or other studies to rule out a serious medical condition. Your health care provider may refer you to a mental health professional for further evaluation. TREATMENT   Most healthy people who have one or two panic attacks in an extreme, life-threatening situation will not require treatment.  The treatment for panic attacks associated with anxiety disorders or other mental illness typically involves counseling with a mental health professional, medicine, or a combination of both. Your health care provider will help determine what treatment is best for you.  Panic attacks due to physical illness usually go away with treatment of the illness. If prescription medicine is causing panic attacks, talk with your health care provider about stopping the medicine, decreasing the dose, or substituting another medicine.  Panic attacks due to alcohol or drug abuse go away with abstinence. Some adults need professional help in order to stop drinking or using drugs. HOME CARE INSTRUCTIONS   Take all medicines as directed by your health care provider.   Schedule and attend follow-up visits as directed by your health care provider. It is important to keep all your appointments. SEEK MEDICAL CARE IF:  You are not able to take your medicines as prescribed.  Your symptoms do not improve or get worse. SEEK IMMEDIATE MEDICAL CARE IF:   You experience panic attack symptoms that are different than your usual symptoms.  You have serious thoughts about hurting yourself or others.  You are taking medicine for panic attacks and have a serious side effect. MAKE SURE  YOU:  Understand these instructions.  Will watch your condition.  Will get help right away if you are not doing well or get worse.   This information is not intended to replace advice given to you by your health care provider. Make sure you discuss any questions you have with your health care provider.   Document Released: 03/09/2005 Document Revised: 03/14/2013 Document Reviewed: 10/21/2012 Elsevier Interactive Patient Education [employer].

## 2015-06-21 DIAGNOSIS — R062 Wheezing: Secondary | ICD-10-CM | POA: Diagnosis not present

## 2015-08-22 DIAGNOSIS — I10 Essential (primary) hypertension: Secondary | ICD-10-CM | POA: Diagnosis not present

## 2015-08-22 DIAGNOSIS — J449 Chronic obstructive pulmonary disease, unspecified: Secondary | ICD-10-CM | POA: Diagnosis not present

## 2015-08-22 DIAGNOSIS — J0191 Acute recurrent sinusitis, unspecified: Secondary | ICD-10-CM | POA: Diagnosis not present

## 2015-08-22 DIAGNOSIS — J301 Allergic rhinitis due to pollen: Secondary | ICD-10-CM | POA: Diagnosis not present

## 2015-08-26 DIAGNOSIS — K219 Gastro-esophageal reflux disease without esophagitis: Secondary | ICD-10-CM | POA: Diagnosis not present

## 2015-09-02 ENCOUNTER — Emergency Department: Payer: Medicare Other

## 2015-09-02 ENCOUNTER — Emergency Department
Admission: EM | Admit: 2015-09-02 | Discharge: 2015-09-02 | Disposition: A | Discharge status: Home / Self Care | Payer: Medicare Other | Attending: Emergency Medicine | Admitting: Emergency Medicine

## 2015-09-02 ENCOUNTER — Encounter: Payer: Self-pay | Admitting: Emergency Medicine

## 2015-09-02 DIAGNOSIS — N200 Calculus of kidney: Secondary | ICD-10-CM | POA: Diagnosis not present

## 2015-09-02 DIAGNOSIS — Z79899 Other long term (current) drug therapy: Secondary | ICD-10-CM | POA: Insufficient documentation

## 2015-09-02 DIAGNOSIS — Z7951 Long term (current) use of inhaled steroids: Secondary | ICD-10-CM | POA: Diagnosis not present

## 2015-09-02 DIAGNOSIS — R109 Unspecified abdominal pain: Secondary | ICD-10-CM | POA: Diagnosis present

## 2015-09-02 DIAGNOSIS — E119 Type 2 diabetes mellitus without complications: Secondary | ICD-10-CM | POA: Insufficient documentation

## 2015-09-02 DIAGNOSIS — I10 Essential (primary) hypertension: Secondary | ICD-10-CM | POA: Diagnosis not present

## 2015-09-02 DIAGNOSIS — N3091 Cystitis, unspecified with hematuria: Secondary | ICD-10-CM | POA: Diagnosis not present

## 2015-09-02 DIAGNOSIS — F332 Major depressive disorder, recurrent severe without psychotic features: Secondary | ICD-10-CM | POA: Insufficient documentation

## 2015-09-02 DIAGNOSIS — J449 Chronic obstructive pulmonary disease, unspecified: Secondary | ICD-10-CM | POA: Insufficient documentation

## 2015-09-02 DIAGNOSIS — R319 Hematuria, unspecified: Secondary | ICD-10-CM

## 2015-09-02 DIAGNOSIS — N3001 Acute cystitis with hematuria: Secondary | ICD-10-CM | POA: Diagnosis not present

## 2015-09-02 DIAGNOSIS — J45909 Unspecified asthma, uncomplicated: Secondary | ICD-10-CM | POA: Insufficient documentation

## 2015-09-02 DIAGNOSIS — N3090 Cystitis, unspecified without hematuria: Secondary | ICD-10-CM

## 2015-09-02 LAB — CBC WITH DIFFERENTIAL/PLATELET
Basophils Absolute: 0.1 10*3/uL (ref 0–0.1)
Basophils Relative: 1 %
Eosinophils Absolute: 0.5 10*3/uL (ref 0–0.7)
Eosinophils Relative: 7 %
HCT: 39.6 % (ref 35.0–47.0)
Hemoglobin: 13.1 g/dL (ref 12.0–16.0)
Lymphocytes Relative: 29 %
Lymphs Abs: 2.1 10*3/uL (ref 1.0–3.6)
MCH: 29.5 pg (ref 26.0–34.0)
MCHC: 33.1 g/dL (ref 32.0–36.0)
MCV: 89.4 fL (ref 80.0–100.0)
Monocytes Absolute: 0.4 10*3/uL (ref 0.2–0.9)
Monocytes Relative: 6 %
Neutro Abs: 4.1 10*3/uL (ref 1.4–6.5)
Neutrophils Relative %: 57 %
Platelets: 230 10*3/uL (ref 150–440)
RBC: 4.43 MIL/uL (ref 3.80–5.20)
RDW: 16.1 % — ABNORMAL HIGH (ref 11.5–14.5)
WBC: 7.3 10*3/uL (ref 3.6–11.0)

## 2015-09-02 LAB — URINALYSIS COMPLETE WITH MICROSCOPIC (ARMC ONLY)
Bilirubin Urine: NEGATIVE
Glucose, UA: NEGATIVE mg/dL
Ketones, ur: NEGATIVE mg/dL
Nitrite: NEGATIVE
Protein, ur: 30 mg/dL — AB
Specific Gravity, Urine: 1.019 (ref 1.005–1.030)
pH: 5 (ref 5.0–8.0)

## 2015-09-02 LAB — BASIC METABOLIC PANEL
Anion gap: 9 (ref 5–15)
BUN: 10 mg/dL (ref 6–20)
CO2: 24 mmol/L (ref 22–32)
Calcium: 8.7 mg/dL — ABNORMAL LOW (ref 8.9–10.3)
Chloride: 106 mmol/L (ref 101–111)
Creatinine, Ser: 0.66 mg/dL (ref 0.44–1.00)
GFR calc Af Amer: >60 mL/min (ref >60)
GFR calc non Af Amer: >60 mL/min (ref >60)
Glucose, Bld: 118 mg/dL — ABNORMAL HIGH (ref 65–99)
Potassium: 4.2 mmol/L (ref 3.5–5.1)
Sodium: 139 mmol/L (ref 135–145)

## 2015-09-02 MED ORDER — CEPHALEXIN 500 MG PO CAPS: 500.0000 mg | ORAL_CAPSULE | Freq: Three times a day (TID) | ORAL | Status: DC

## 2015-09-02 MED ORDER — KETOROLAC TROMETHAMINE 30 MG/ML IJ SOLN
10.0000 mg | Freq: Once | INTRAMUSCULAR | Status: AC
  Administered 2015-09-02: 9.9 mg via INTRAVENOUS
  Filled 2015-09-02: qty 1

## 2015-09-02 MED ORDER — OXYCODONE-ACETAMINOPHEN 5-325 MG PO TABS: 1.0000 | ORAL_TABLET | Freq: Four times a day (QID) | ORAL | Status: DC | PRN

## 2015-09-02 MED ORDER — SODIUM CHLORIDE 0.9 % IV BOLUS (SEPSIS)
1000.0000 mL | Freq: Once | INTRAVENOUS | Status: AC
  Administered 2015-09-02: 1000 mL via INTRAVENOUS

## 2015-09-02 MED ORDER — OXYCODONE-ACETAMINOPHEN 5-325 MG PO TABS
1.0000 | ORAL_TABLET | Freq: Once | ORAL | Status: AC
  Administered 2015-09-02: 1 via ORAL
  Filled 2015-09-02: qty 1

## 2015-09-02 MED ORDER — ONDANSETRON 4 MG PO TBDP: 4.0000 mg | ORAL_TABLET | Freq: Three times a day (TID) | ORAL | Status: DC | PRN

## 2015-09-02 MED ORDER — PENTAFLUOROPROP-TETRAFLUOROETH EX AERO
INHALATION_SPRAY | CUTANEOUS | Status: AC
  Filled 2015-09-02: qty 30

## 2015-09-02 MED ORDER — ONDANSETRON HCL 4 MG/2ML IJ SOLN
4.0000 mg | Freq: Once | INTRAMUSCULAR | Status: AC
  Administered 2015-09-02: 4 mg via INTRAVENOUS
  Filled 2015-09-02: qty 2

## 2015-09-02 NOTE — ED Notes (Signed)
AAOx3.  Skin warm and dry. NAD.  Ambulates with easy and steady gait.   

## 2015-09-02 NOTE — Discharge Instructions (Signed)
You were prescribed a medication that is potentially sedating. Do not drink alcohol, drive or participate in any other potentially dangerous activities while taking this medication as it may make you sleepy. Do not take this medication with any other sedating medications, either prescription or over-the-counter. If you were prescribed Percocet or Vicodin, do not take these with acetaminophen (Tylenol) as it is already contained within these medications.   Opioid pain medications (or "narcotics") can be habit forming.  Use it as little as possible to achieve adequate pain control.  Do not use or use it with extreme caution if you have a history of opiate abuse or dependence.  If you are on a pain contract with your primary care doctor or a pain specialist, be sure to let them know you were prescribed this medication today from the [facility] Department.  This medication is intended for your use only - do not give any to anyone else and keep it in a secure place where nobody else, especially children and pets, have access to it.  It will also cause or worsen constipation, so you may want to consider taking an over-the-counter stool softener while you are taking this medication.  Hematuria, Adult Hematuria is blood in your urine. It can be caused by a bladder infection, kidney infection, prostate infection, kidney stone, or cancer of your urinary tract. Infections can usually be treated with medicine, and a kidney stone usually will pass through your urine. If neither of these is the cause of your hematuria, further workup to find out the reason may be needed. It is very important that you tell your health care provider about any blood you see in your urine, even if the blood stops without treatment or happens without causing pain. Blood in your urine that happens and then stops and then happens again can be a symptom of a very serious condition. Also, pain is not a symptom in the initial stages  of many urinary cancers. HOME CARE INSTRUCTIONS   Drink lots of fluid, 3-4 quarts a day. If you have been diagnosed with an infection, cranberry juice is especially recommended, in addition to large amounts of water.  Avoid caffeine, tea, and carbonated beverages because they tend to irritate the bladder.  Avoid alcohol because it may irritate the prostate.  Take all medicines as directed by your health care provider.  If you were prescribed an antibiotic medicine, finish it all even if you start to feel better.  If you have been diagnosed with a kidney stone, follow your health care provider's instructions regarding straining your urine to catch the stone.  Empty your bladder often. Avoid holding urine for long periods of time.  After a bowel movement, women should cleanse front to back. Use each tissue only once.  Empty your bladder before and after sexual intercourse if you are a female. SEEK MEDICAL CARE IF:  You develop back pain.  You have a fever.  You have a feeling of sickness in your stomach (nausea) or vomiting.  Your symptoms are not better in 3 days. Return sooner if you are getting worse. SEEK IMMEDIATE MEDICAL CARE IF:   You develop severe vomiting and are unable to keep the medicine down.  You develop severe back or abdominal pain despite taking your medicines.  You begin passing a large amount of blood or clots in your urine.  You feel extremely weak or faint, or you pass out. MAKE SURE YOU:   Understand these instructions.  Will watch your condition.  Will get help right away if you are not doing well or get worse.   This information is not intended to replace advice given to you by your health care provider. Make sure you discuss any questions you have with your health care provider.   Document Released: 03/09/2005 Document Revised: 03/30/2014 Document Reviewed: 11/07/2012 Elsevier Interactive Patient Education 2016 [place].  Kidney  Stones Kidney stones (urolithiasis) are deposits that form inside your kidneys. The intense pain is caused by the stone moving through the urinary tract. When the stone moves, the ureter goes into spasm around the stone. The stone is usually passed in the urine.  CAUSES   A disorder that makes certain neck glands produce too much parathyroid hormone (primary hyperparathyroidism).  A buildup of uric acid crystals, similar to gout in your joints.  Narrowing (stricture) of the ureter.  A kidney obstruction present at birth (congenital obstruction).  Previous surgery on the kidney or ureters.  Numerous kidney infections. SYMPTOMS   Feeling sick to your stomach (nauseous).  Throwing up (vomiting).  Blood in the urine (hematuria).  Pain that usually spreads (radiates) to the groin.  Frequency or urgency of urination. DIAGNOSIS   Taking a history and physical exam.  Blood or urine tests.  CT scan.  Occasionally, an examination of the inside of the urinary bladder (cystoscopy) is performed. TREATMENT   Observation.  Increasing your fluid intake.  Extracorporeal shock wave lithotripsy--This is a noninvasive procedure that uses shock waves to break up kidney stones.  Surgery may be needed if you have severe pain or persistent obstruction. There are various surgical procedures. Most of the procedures are performed with the use of small instruments. Only small incisions are needed to accommodate these instruments, so recovery time is minimized. The size, location, and chemical composition are all important variables that will determine the proper choice of action for you. Talk to your health care provider to better understand your situation so that you will minimize the risk of injury to yourself and your kidney.  HOME CARE INSTRUCTIONS   Drink enough water and fluids to keep your urine clear or pale yellow. This will help you to pass the stone or stone fragments.  Strain all  urine through the provided strainer. Keep all particulate matter and stones for your health care provider to see. The stone causing the pain may be as small as a grain of salt. It is very important to use the strainer each and every time you pass your urine. The collection of your stone will allow your health care provider to analyze it and verify that a stone has actually passed. The stone analysis will often identify what you can do to reduce the incidence of recurrences.  Only take over-the-counter or prescription medicines for pain, discomfort, or fever as directed by your health care provider.  Keep all follow-up visits as told by your health care provider. This is important.  Get follow-up X-rays if required. The absence of pain does not always mean that the stone has passed. It may have only stopped moving. If the urine remains completely obstructed, it can cause loss of kidney function or even complete destruction of the kidney. It is your responsibility to make sure X-rays and follow-ups are completed. Ultrasounds of the kidney can show blockages and the status of the kidney. Ultrasounds are not associated with any radiation and can be performed easily in a matter of minutes.  Make changes to your  daily diet as told by your health care provider. You may be told to:  Limit the amount of salt that you eat.  Eat 5 or more servings of fruits and vegetables each day.  Limit the amount of meat, poultry, fish, and eggs that you eat.  Collect a 24-hour urine sample as told by your health care provider.You may need to collect another urine sample every 6-12 months. SEEK MEDICAL CARE IF:  You experience pain that is progressive and unresponsive to any pain medicine you have been prescribed. SEEK IMMEDIATE MEDICAL CARE IF:   Pain cannot be controlled with the prescribed medicine.  You have a fever or shaking chills.  The severity or intensity of pain increases over 18 hours and is not  relieved by pain medicine.  You develop a new onset of abdominal pain.  You feel faint or pass out.  You are unable to urinate.   This information is not intended to replace advice given to you by your health care provider. Make sure you discuss any questions you have with your health care provider.   Document Released: 03/09/2005 Document Revised: 11/28/2014 Document Reviewed: 08/10/2012 Elsevier Interactive Patient Education 2016 Else[employer] Inc[employer]  Urinary Tract Infection Urinary tract infections (UTIs) can develop anywhere along your urinary tract. Your urinary tract is your body's drainage system for removing wastes and extra water. Your urinary tract includes two kidneys, two ureters, a bladder, and a urethra. Your kidneys are a pair of bean-shaped organs. Each kidney is about the size of your fist. They are located below your ribs, one on each side of your spine. CAUSES Infections are caused by microbes, which are microscopic organisms, including fungi, viruses, and bacteria. These organisms are so small that they can only be seen through a microscope. Bacteria are the microbes that most commonly cause UTIs. SYMPTOMS  Symptoms of UTIs may vary by age and gender of the patient and by the location of the infection. Symptoms in young women typically include a frequent and intense urge to urinate and a painful, burning feeling in the bladder or urethra during urination. Older women and men are more likely to be tired, shaky, and weak and have muscle aches and abdominal pain. A fever may mean the infection is in your kidneys. Other symptoms of a kidney infection include pain in your back or sides below the ribs, nausea, and vomiting. DIAGNOSIS To diagnose a UTI, your caregiver will ask you about your symptoms. Your caregiver will also ask you to provide a urine sample. The urine sample will be tested for bacteria and white blood cells. White blood cells are made by your body to help fight  infection. TREATMENT  Typically, UTIs can be treated with medication. Because most UTIs are caused by a bacterial infection, they usually can be treated with the use of antibiotics. The choice of antibiotic and length of treatment depend on your symptoms and the type of bacteria causing your infection. HOME CARE INSTRUCTIONS  If you were prescribed antibiotics, take them exactly as your caregiver instructs you. Finish the medication even if you feel better after you have only taken some of the medication.  Drink enough water and fluids to keep your urine clear or pale yellow.  Avoid caffeine, tea, and carbonated beverages. They tend to irritate your bladder.  Empty your bladder often. Avoid holding urine for long periods of time.  Empty your bladder before and after sexual intercourse.  After a bowel movement, women should cleanse from front  to back. Use each tissue only once. SEEK MEDICAL CARE IF:   You have back pain.  You develop a fever.  Your symptoms do not begin to resolve within 3 days. SEEK IMMEDIATE MEDICAL CARE IF:   You have severe back pain or lower abdominal pain.  You develop chills.  You have nausea or vomiting.  You have continued burning or discomfort with urination. MAKE SURE YOU:   Understand these instructions.  Will watch your condition.  Will get help right away if you are not doing well or get worse.   This information is not intended to replace advice given to you by your health care provider. Make sure you discuss any questions you have with your health care provider.   Document Released: 12/17/2004 Document Revised: 11/28/2014 Document Reviewed: 04/17/2011 Elsevier Interactive Patient Education [employer].

## 2015-09-02 NOTE — ED Provider Notes (Signed)
[facility] Emergency Department Provider Note  ____________________________________________  Time seen: 8:20 AM  I have reviewed the triage vital signs and the nursing notes.   HISTORY  Chief Complaint Flank Pain    HPI Summer Murphy is a 60 y.o. female who complains of severe left flank pain for the past 24 hours. Constant, steady, no aggravating or alleviating factors. No associated dysuria frequency urgency or hematuria. No vaginal bleeding. No diarrhea but has had nausea and vomiting. No fevers chills or sweats. Feels like kidney stones which she's had multiple times in the past.   Past Medical History  Diagnosis Date  . Asthma   . Hypertension   . Kidney stone   . Migraine   . Anxiety   . Kidney stones   . Diabetes mellitus without complication ([place])   . Yeast vaginitis   . Left flank pain      Patient Active Problem List   Diagnosis Date Noted  . Severe recurrent major depression without psychotic features ([place]) 09/10/2014  . COPD (chronic obstructive pulmonary disease) ([place]) 09/10/2014  . Calculi, ureter 04/26/2013  . Corneal graft malfunction 08/17/2012  . Calculus of kidney 04/26/2012  . Renal colic 99991111  . Urge incontinence 04/26/2012  . Diaphragmatic hernia 10/27/2010  . Barrett esophagus 07/07/2010     Past Surgical History  Procedure Laterality Date  . Appendectomy    . Cholecystectomy    . Ureteroscopy    . Laparoscopic hysterectomy    . Lithotripsy       Current Outpatient Rx  Name  Route  Sig  Dispense  Refill  . amitriptyline (ELAVIL) 50 MG tablet   Oral   Take 50 mg by mouth at bedtime.         Marland Kitchen azelastine (ASTELIN) 0.1 % nasal spray   Each Nare   Place 1 spray into both nostrils at bedtime.         . Brexpiprazole (REXULTI) 4 MG TABS   Oral   Take 4 mg by mouth at bedtime.         . budesonide-formoterol (SYMBICORT) 160-4.5 MCG/ACT inhaler   Inhalation   Inhale 2 puffs into the lungs 2  (two) times daily.         . citalopram (CELEXA) 10 MG tablet   Oral   Take 10 mg by mouth daily.         . diclofenac sodium (VOLTAREN) 1 % GEL   Topical   Apply 2 g topically 2 (two) times daily as needed (for pain).          Marland Kitchen diltiazem (DILACOR XR) 180 MG 24 hr capsule   Oral   Take 180 mg by mouth daily.         . DULoxetine (CYMBALTA) 30 MG capsule   Oral   Take 30-60 mg by mouth 2 (two) times daily. 30 mg every morning and 60 mg at bedtime         . EPINEPHrine 0.3 mg/0.3 mL IJ SOAJ injection   Intramuscular   Inject 0.3 mg into the muscle once.         . estrogens, conjugated, (PREMARIN) 0.45 MG tablet   Oral   Take 0.45 mg by mouth every evening.          . fluticasone (FLONASE) 50 MCG/ACT nasal spray   Each Nare   Place 2 sprays into both nostrils daily.         Marland Kitchen gabapentin (NEURONTIN)  400 MG capsule   Oral   Take 400 mg by mouth 2 (two) times daily.         . Ipratropium-Albuterol (COMBIVENT) 20-100 MCG/ACT AERS respimat   Inhalation   Inhale 1 puff into the lungs 4 (four) times daily.         Marland Kitchen ipratropium-albuterol (DUONEB) 0.5-2.5 (3) MG/3ML SOLN   Nebulization   Take 3 mLs by nebulization every 4 (four) hours as needed (for shortness of breath/wheezing).          Marland Kitchen levocetirizine (XYZAL) 5 MG tablet   Oral   Take 5 mg by mouth at bedtime.          . montelukast (SINGULAIR) 10 MG tablet   Oral   Take 10 mg by mouth at bedtime.         . Multiple Vitamin (MULTIVITAMIN WITH MINERALS) TABS tablet   Oral   Take 1 tablet by mouth daily.         . Olopatadine HCl 0.2 % SOLN   Both Eyes   Place 1 drop into both eyes at bedtime.         Marland Kitchen omeprazole (PRILOSEC) 20 MG capsule   Oral   Take 20 mg by mouth 2 (two) times daily.         . ondansetron (ZOFRAN) 4 MG tablet   Oral   Take 1 tablet (4 mg total) by mouth every 8 (eight) hours as needed for nausea or vomiting.   15 tablet   0   . rOPINIRole (REQUIP) 0.25 MG  tablet   Oral   Take 0.25 mg by mouth 2 (two) times daily.         . tamsulosin (FLOMAX) 0.4 MG CAPS capsule   Oral   Take 1 capsule (0.4 mg total) by mouth daily. Patient taking differently: Take 0.4 mg by mouth daily as needed (for kidney stones).    14 capsule   0   . tiotropium (SPIRIVA) 18 MCG inhalation capsule   Inhalation   Place 18 mcg into inhaler and inhale daily.         Marland Kitchen topiramate (TOPAMAX) 50 MG tablet   Oral   Take 50 mg by mouth at bedtime.         . traMADol (ULTRAM) 50 MG tablet   Oral   Take 50-100 mg by mouth every 6 (six) hours as needed for moderate pain or severe pain.         . traZODone (DESYREL) 50 MG tablet   Oral   Take 75 mg by mouth at bedtime.         . valACYclovir (VALTREX) 500 MG tablet   Oral   Take 500 mg by mouth 2 (two) times daily as needed (for fever blisters).          . cephALEXin (KEFLEX) 500 MG capsule   Oral   Take 1 capsule (500 mg total) by mouth 3 (three) times daily.   21 capsule   0   . cyclobenzaprine (FLEXERIL) 10 MG tablet   Oral   Take 1 tablet (10 mg total) by mouth every 8 (eight) hours as needed for muscle spasms. Patient not taking: Reported on 09/02/2015   30 tablet   1   . LORazepam (ATIVAN) 1 MG tablet   Oral   Take 1 tablet (1 mg total) by mouth 2 (two) times daily. Patient not taking: Reported on 09/02/2015   20 tablet   0   .  ondansetron (ZOFRAN ODT) 4 MG disintegrating tablet   Oral   Take 1 tablet (4 mg total) by mouth every 8 (eight) hours as needed for nausea or vomiting.   20 tablet   0   . oxyCODONE-acetaminophen (ROXICET) 5-325 MG tablet   Oral   Take 1 tablet by mouth every 6 (six) hours as needed for severe pain.   8 tablet   0   . predniSONE (STERAPRED UNI-PAK 21 TAB) 10 MG (21) TBPK tablet   Oral   Take 1 tablet (10 mg total) by mouth daily. Patient not taking: Reported on 09/02/2015   21 tablet   0      Allergies Librium; Nsaids; Vanilla; Aspirin;  Azithromycin; Chlordiazepoxide hcl; Iron; Morphine and related; Reglan; and Sulfa antibiotics   Family History  Problem Relation Age of Onset  . Prostate cancer Father   . Kidney Stones Father     Social History Social History  Substance Use Topics  . Smoking status: Never Smoker   . Smokeless tobacco: None  . Alcohol Use: No    Review of Systems  Constitutional:   No fever or chills.  Eyes:   No vision changes.  ENT:   No sore throat. No rhinorrhea. Cardiovascular:   No chest pain. Respiratory:   No dyspnea or cough. Gastrointestinal:   Left abdominal pain and flank pain with vomiting..  Genitourinary:   Negative for dysuria or difficulty urinating. Musculoskeletal:   Negative for focal pain or swelling Neurological:   Negative for headaches 10-point ROS otherwise negative.  ____________________________________________   PHYSICAL EXAM:  VITAL SIGNS: ED Triage Vitals  Enc Vitals Group     BP 09/02/15 0805 143/73 mmHg     Pulse Rate 09/02/15 0805 97     Resp 09/02/15 0805 20     Temp 09/02/15 0805 97.6 F (36.4 C)     Temp Source 09/02/15 0805 Oral     SpO2 09/02/15 0805 98 %     Weight 09/02/15 0805 174 lb (78.926 kg)     Height 09/02/15 0805 5\' 3"  (1.6 m)     Head Cir --      Peak Flow --      Pain Score 09/02/15 0806 10     Pain Loc --      Pain Edu? --      Excl. in [place]? --     Vital signs reviewed, nursing assessments reviewed.   Constitutional:   Alert and oriented. Well appearing and in no distress. Eyes:   No scleral icterus. No conjunctival pallor. PERRL. EOMI.  No nystagmus. ENT   Head:   Normocephalic and atraumatic.   Nose:   No congestion/rhinnorhea. No septal hematoma   Mouth/Throat:   MMM, no pharyngeal erythema. No peritonsillar mass.    Neck:   No stridor. No SubQ emphysema. No meningismus. Hematological/Lymphatic/Immunilogical:   No cervical lymphadenopathy. Cardiovascular:   RRR. Symmetric bilateral radial and DP pulses.   No murmurs.  Respiratory:   Normal respiratory effort without tachypnea nor retractions. Breath sounds are clear and equal bilaterally. No wheezes/rales/rhonchi. Gastrointestinal:   Soft With mild suprapubic tenderness. Non distended. There is no CVA tenderness.  No rebound, rigidity, or guarding. Genitourinary:   deferred Musculoskeletal:   Nontender with normal range of motion in all extremities. No joint effusions.  No lower extremity tenderness.  No edema. Neurologic:   Normal speech and language.  CN 2-10 normal. Motor grossly intact. No gross focal neurologic deficits are appreciated.  Skin:    Skin is warm, dry and intact. No rash noted.  No petechiae, purpura, or bullae.  ____________________________________________    LABS (pertinent positives/negatives) (all labs ordered are listed, but only abnormal results are displayed) Labs Reviewed  URINALYSIS COMPLETEWITH MICROSCOPIC ([place]) - Abnormal; Notable for the following:    Color, Urine YELLOW (*)    APPearance CLOUDY (*)    Hgb urine dipstick 1+ (*)    Protein, ur 30 (*)    Leukocytes, UA 3+ (*)    Bacteria, UA RARE (*)    Squamous Epithelial / LPF TOO NUMEROUS TO COUNT (*)    All other components within normal limits  BASIC METABOLIC PANEL - Abnormal; Notable for the following:    Glucose, Bld 118 (*)    Calcium 8.7 (*)    All other components within normal limits  CBC WITH DIFFERENTIAL/PLATELET - Abnormal; Notable for the following:    RDW 16.1 (*)    All other components within normal limits  URINE CULTURE   ____________________________________________   EKG    ____________________________________________    RADIOLOGY  CT abdomen and pelvis reveals multiple small stones in the kidneys, no ureterolithiasis or hydronephrosis.  ____________________________________________   PROCEDURES   ____________________________________________   INITIAL IMPRESSION / ASSESSMENT AND PLAN / ED  COURSE  Pertinent labs & imaging results that were available during my care of the patient were reviewed by me and considered in my medical decision making (see chart for details).  Patient well appearing no acute distress. Presents with left flank pain. Found to have hematuria and urinary tract infection consistent with cystitis, also multiple small stones on CT but no evidence of urinary obstruction. We'll start on Keflex, urine culture sent, Percocet and Zofran for symptom relief until she can follow up with her primary care doctor in 2 days. Considering the patient's symptoms, medical history, and physical examination today, I have low suspicion for cholecystitis or biliary pathology, pancreatitis, perforation or bowel obstruction, hernia, intra-abdominal abscess, AAA or dissection, volvulus or intussusception, mesenteric ischemia, or appendicitis.      ____________________________________________   FINAL CLINICAL IMPRESSION(S) / ED DIAGNOSES  Final diagnoses:  Cystitis  Hematuria  Kidney stones       Portions of this note were generated with dragon dictation software. Dictation errors may occur despite best attempts at proofreading.   Carrie Mew, MD 09/02/15 1122

## 2015-09-02 NOTE — ED Notes (Signed)
Pt states she has pain in her left flank since yesterday. Denies urinary frequency, hematuria. Pt states she has had vomiting as well.

## 2015-09-02 NOTE — ED Notes (Signed)
Pt presents with left side flank pain started yesterday. Pt with hx of kidney stones.

## 2015-09-03 LAB — URINE CULTURE

## 2015-09-05 DIAGNOSIS — M545 Low back pain: Secondary | ICD-10-CM | POA: Diagnosis not present

## 2015-09-05 DIAGNOSIS — N200 Calculus of kidney: Secondary | ICD-10-CM | POA: Diagnosis not present

## 2015-09-05 DIAGNOSIS — N390 Urinary tract infection, site not specified: Secondary | ICD-10-CM | POA: Diagnosis not present

## 2015-09-09 ENCOUNTER — Encounter: Payer: Self-pay | Admitting: *Deleted

## 2015-09-09 ENCOUNTER — Ambulatory Visit (INDEPENDENT_AMBULATORY_CARE_PROVIDER_SITE_OTHER): Payer: Medicare Other | Admitting: Urology

## 2015-09-09 VITALS — BP 106/68 | HR 91 | Ht 63.0 in | Wt 178.7 lb

## 2015-09-09 DIAGNOSIS — N200 Calculus of kidney: Secondary | ICD-10-CM | POA: Diagnosis not present

## 2015-09-09 DIAGNOSIS — R3129 Other microscopic hematuria: Secondary | ICD-10-CM

## 2015-09-09 DIAGNOSIS — R109 Unspecified abdominal pain: Secondary | ICD-10-CM

## 2015-09-09 LAB — MICROSCOPIC EXAMINATION
Bacteria, UA: NONE SEEN
Epithelial Cells (non renal): >10 /hpf — AB (ref 0–10)
RBC, UA: NONE SEEN /hpf (ref 0–?)

## 2015-09-09 LAB — URINALYSIS, COMPLETE
Bilirubin, UA: NEGATIVE
Glucose, UA: NEGATIVE
Ketones, UA: NEGATIVE
Nitrite, UA: NEGATIVE
Protein, UA: NEGATIVE
RBC, UA: NEGATIVE
Specific Gravity, UA: 1.02 (ref 1.005–1.030)
Urobilinogen, Ur: 0.2 mg/dL (ref 0.2–1.0)
pH, UA: 6 (ref 5.0–7.5)

## 2015-09-09 NOTE — Progress Notes (Signed)
09/09/2015 9:00 PM   Summer Murphy Feb 10, 1956 [id number]  Referring provider: Lavera Guise, MD [address] [place], [place] [address]  Chief Complaint  Patient presents with  . Nephrolithiasis    seen at ER bilateral kidney stones    HPI: Patient is a 60 year old Caucasian female with a history of nephrolithiasis who is referred to [place] by [facility] ED for bilateral nephrolithiasis and left-sided flank pain.  She states that approximately 2 weeks ago she started experiencing severe left-sided flank pain. She states the pain was so intense she felt like it was "killing her."  She described the pain as feeling like she was being poked with several needles all over her left flank. It then radiated into her left side.  She stated that she began to become nauseous and started to vomit.  She fell that with this symptomatology was reminiscent of previous stones that she has had in the past, so she sought treatment in the emergency department on 09/02/2015.  In the ED, she was given IV fluids, IV analgesics and IV Toradol. A CT renal stone study performed that day noted a 4 x 4 millimeter calculus in the left kidney with an adjacent 1 mm calculus. She also had to 1 mm stones in her right kidney.  No hydronephrosis of ureteral calculi were seen on either side. I independently reviewed the films and reviewed them with the patient.  Her UA from that day and noted TNTC rbc's per high-power field and TNTC wbc's per high-powered field. This was most likely contaminated specimen due to too numerous to count squamous epithelial were seen as well. Urine culture surprisingly grew out multiple organisms from that day. She was given Percocet, Zofran and Keflex and instructed to follow up with urology.  Today, she is still experiencing the left sided flank pain.  She has found that nothing makes the pain worse. She states her tramadol will help ease the pain. She states is still  reaches levels of 10 out of 10. The pain has not changed position from the left flank. She denies dysuria, suprapubic pain or gross hematuria. She also has not had recent fevers, chills, nausea or vomiting.  Her UA today was unremarkable.  PMH: Past Medical History  Diagnosis Date  . Asthma   . Hypertension   . Kidney stone   . Migraine   . Anxiety   . Acid reflux   . Diabetes mellitus without complication ([place])   . Yeast vaginitis   . Left flank pain   . Hematuria   . Arrhythmia   . Depression   . Fever blister   . Restless leg   . Arthritis     Surgical History: Past Surgical History  Procedure Laterality Date  . Appendectomy    . Cholecystectomy    . Ureteroscopy    . Laparoscopic hysterectomy    . Lithotripsy    . Hh repair    . Joint replacement Bilateral     total knees  . Cardiac electrophysiology study and ablation      Home Medications:    Medication List       This list is accurate as of: 09/09/15 11:59 PM.  Always use your most recent med list.               ALPRAZolam 0.25 MG tablet  Commonly known as:  XANAX  Take by mouth. Reported on 09/09/2015     amitriptyline 50 MG tablet  Commonly known as:  ELAVIL  Take 50 mg by mouth at bedtime.     azelastine 0.1 % nasal spray  Commonly known as:  ASTELIN  Place 1 spray into both nostrils at bedtime.     budesonide-formoterol 160-4.5 MCG/ACT inhaler  Commonly known as:  SYMBICORT  Inhale 2 puffs into the lungs 2 (two) times daily.     butalbital-acetaminophen-caffeine 50-325-40 MG tablet  Commonly known as:  FIORICET, ESGIC     cephALEXin 500 MG capsule  Commonly known as:  KEFLEX  Take 1 capsule (500 mg total) by mouth 3 (three) times daily.     citalopram 10 MG tablet  Commonly known as:  CELEXA  Take 10 mg by mouth daily.     cyclobenzaprine 10 MG tablet  Commonly known as:  FLEXERIL  Take 1 tablet (10 mg total) by mouth every 8 (eight) hours as needed for muscle spasms.      diclofenac sodium 1 % Gel  Commonly known as:  VOLTAREN  Apply 2 g topically 2 (two) times daily as needed (for pain).     diltiazem 180 MG 24 hr capsule  Commonly known as:  DILACOR XR  Take 180 mg by mouth daily.     DULoxetine 30 MG capsule  Commonly known as:  CYMBALTA  Take 30-60 mg by mouth 2 (two) times daily. 30 mg every morning and 60 mg at bedtime     EPINEPHrine 0.3 mg/0.3 mL Soaj injection  Commonly known as:  EPI-PEN  Inject 0.3 mg into the muscle once.     estrogens (conjugated) 0.45 MG tablet  Commonly known as:  PREMARIN  Take 0.45 mg by mouth every evening. Reported on 09/09/2015     fluticasone 50 MCG/ACT nasal spray  Commonly known as:  FLONASE  Place 2 sprays into both nostrils daily.     gabapentin 400 MG capsule  Commonly known as:  NEURONTIN  Take 400 mg by mouth 2 (two) times daily.     Ipratropium-Albuterol 20-100 MCG/ACT Aers respimat  Commonly known as:  COMBIVENT  Inhale 1 puff into the lungs 4 (four) times daily.     ipratropium-albuterol 0.5-2.5 (3) MG/3ML Soln  Commonly known as:  DUONEB  Take 3 mLs by nebulization every 4 (four) hours as needed (for shortness of breath/wheezing).     levocetirizine 5 MG tablet  Commonly known as:  XYZAL  Take 5 mg by mouth at bedtime.     LORazepam 1 MG tablet  Commonly known as:  ATIVAN  Take 1 tablet (1 mg total) by mouth 2 (two) times daily.     montelukast 10 MG tablet  Commonly known as:  SINGULAIR  Take 10 mg by mouth at bedtime.     multivitamin with minerals Tabs tablet  Take 1 tablet by mouth daily.     nystatin 100000 UNIT/ML suspension  Commonly known as:  MYCOSTATIN     Olopatadine HCl 0.2 % Soln  Place 1 drop into both eyes at bedtime.     omeprazole 20 MG capsule  Commonly known as:  PRILOSEC  Take 20 mg by mouth 2 (two) times daily.     ondansetron 4 MG disintegrating tablet  Commonly known as:  ZOFRAN ODT  Take 1 tablet (4 mg total) by mouth every 8 (eight) hours as needed  for nausea or vomiting.     ondansetron 4 MG tablet  Commonly known as:  ZOFRAN  Take 1 tablet (4 mg total) by mouth every  8 (eight) hours as needed for nausea or vomiting.     oxyCODONE 5 MG immediate release tablet  Commonly known as:  Oxy IR/ROXICODONE  Reported on 09/09/2015     oxyCODONE-acetaminophen 5-325 MG tablet  Commonly known as:  ROXICET  Take 1 tablet by mouth every 6 (six) hours as needed for severe pain.     predniSONE 10 MG (21) Tbpk tablet  Commonly known as:  STERAPRED UNI-PAK 21 TAB  Take 1 tablet (10 mg total) by mouth daily.     REXULTI 4 MG Tabs  Generic drug:  Brexpiprazole  Take 4 mg by mouth at bedtime.     rOPINIRole 0.25 MG tablet  Commonly known as:  REQUIP  Take 0.25 mg by mouth 2 (two) times daily.     tamsulosin 0.4 MG Caps capsule  Commonly known as:  FLOMAX  Take 1 capsule (0.4 mg total) by mouth daily.     tiotropium 18 MCG inhalation capsule  Commonly known as:  SPIRIVA  Place 18 mcg into inhaler and inhale daily.     topiramate 50 MG tablet  Commonly known as:  TOPAMAX  Take 50 mg by mouth at bedtime.     traMADol 50 MG tablet  Commonly known as:  ULTRAM  Take 50-100 mg by mouth every 6 (six) hours as needed for moderate pain or severe pain.     traZODone 50 MG tablet  Commonly known as:  DESYREL  Take 75 mg by mouth at bedtime.     valACYclovir 500 MG tablet  Commonly known as:  VALTREX  Take 500 mg by mouth 2 (two) times daily as needed (for fever blisters).     VITAMIN D-1000 MAX ST 1000 units tablet  Generic drug:  Cholecalciferol  Take by mouth.        Allergies:  Allergies  Allergen Reactions  . Librium [Chlordiazepoxide] Shortness Of Breath  . Nsaids     Other reaction(s): Other (See Comments) Other Reaction: Allergy  . Vanilla Shortness Of Breath  . Aspirin Hives  . Azithromycin     Other reaction(s): Unknown  . Buprenorphine Hcl   . Chlordiazepoxide Hcl     Other reaction(s): OTHER  . Iron     Other  reaction(s): Unknown  . Morphine And Related   . Reglan [Metoclopramide] Hives  . Sulfa Antibiotics     Other reaction(s): Unknown  . Tolmetin     Other reaction(s): Other (See Comments) Other Reaction: Allergy    Family History: Family History  Problem Relation Age of Onset  . Prostate cancer Father   . Kidney Stones Father   . Kidney disease Neg Hx     Social History:  reports that she has never smoked. She does not have any smokeless tobacco history on file. She reports that she does not drink alcohol or use illicit drugs.  ROS: UROLOGY Frequent Urination?: No Hard to postpone urination?: No Burning/pain with urination?: No Get up at night to urinate?: No Leakage of urine?: No Urine stream starts and stops?: No Trouble starting stream?: No Do you have to strain to urinate?: No Blood in urine?: Yes Urinary tract infection?: Yes Sexually transmitted disease?: No Injury to kidneys or bladder?: No Painful intercourse?: No Weak stream?: No Currently pregnant?: No Vaginal bleeding?: No Last menstrual period?: n  Gastrointestinal Nausea?: Yes Vomiting?: No Indigestion/heartburn?: No Diarrhea?: No Constipation?: No  Constitutional Fever: No Night sweats?: No Weight loss?: No Fatigue?: No  Skin Skin rash/lesions?: No Itching?:  No  Eyes Blurred vision?: No Double vision?: No  Ears/Nose/Throat Sore throat?: No Sinus problems?: No  Hematologic/Lymphatic Swollen glands?: No Easy bruising?: No  Cardiovascular Leg swelling?: No Chest pain?: No  Respiratory Cough?: No Shortness of breath?: No  Endocrine Excessive thirst?: No  Musculoskeletal Back pain?: No Joint pain?: No  Neurological Headaches?: No Dizziness?: No  Psychologic Depression?: No Anxiety?: No  Physical Exam: BP 106/68 mmHg  Pulse 91  Ht '5\' 3"'  (1.6 m)  Wt 178 lb 11.2 oz (81.058 kg)  BMI 31.66 kg/m2  Constitutional: Well nourished. Alert and oriented, No acute  distress. HEENT: [place] AT, moist mucus membranes. Trachea midline, no masses. Cardiovascular: No clubbing, cyanosis, or edema. Respiratory: Normal respiratory effort, no increased work of breathing. GI: Abdomen is soft, non tender, non distended, no abdominal masses. Liver and spleen not palpable.  No hernias appreciated.  Stool sample for occult testing is not indicated.   GU: Left CVA tenderness.  No bladder fullness or masses.   Skin: No rashes, bruises or suspicious lesions. Lymph: No cervical or inguinal adenopathy. Neurologic: Grossly intact, no focal deficits, moving all 4 extremities. Psychiatric: Normal mood and affect.  Laboratory Data: Lab Results  Component Value Date   WBC 7.3 09/02/2015   HGB 13.1 09/02/2015   HCT 39.6 09/02/2015   MCV 89.4 09/02/2015   PLT 230 09/02/2015    Lab Results  Component Value Date   CREATININE 0.66 09/02/2015    Lab Results  Component Value Date   AST 24 06/18/2015   Lab Results  Component Value Date   ALT 35 06/18/2015     Urinalysis Results for orders placed or performed in visit on 09/09/15  Microscopic Examination  Result Value Ref Range   WBC, UA 11-30 (A) 0 -  5 /hpf   RBC, UA None seen 0 -  2 /hpf   Epithelial Cells (non renal) >10 (A) 0 - 10 /hpf   Bacteria, UA None seen None seen/Few  Urinalysis, Complete  Result Value Ref Range   Specific Gravity, UA 1.020 1.005 - 1.030   pH, UA 6.0 5.0 - 7.5   Color, UA Yellow Yellow   Appearance Ur Hazy (A) Clear   Leukocytes, UA 3+ (A) Negative   Protein, UA Negative Negative/Trace   Glucose, UA Negative Negative   Ketones, UA Negative Negative   RBC, UA Negative Negative   Bilirubin, UA Negative Negative   Urobilinogen, Ur 0.2 0.2 - 1.0 mg/dL   Nitrite, UA Negative Negative   Microscopic Examination See below:     Pertinent Imaging: CLINICAL DATA: Left-sided flank pain for 1 day. Vomiting.  EXAM: CT ABDOMEN AND PELVIS WITHOUT  CONTRAST  TECHNIQUE: Multidetector CT imaging of the abdomen and pelvis was performed following the standard protocol without oral or intravenous contrast material administration.  COMPARISON: June 18, 2015  FINDINGS: Lower chest: There is mild scarring in the lateral left base. Lung bases otherwise are clear. There is a moderate hiatal hernia with surgical clips in the lower thoracic region in the region of the hernia. There is an area of probable fat necrosis in the right breast on the initial CT slice.  Hepatobiliary: Liver measures 21.4 cm. There is hepatic steatosis. No focal liver lesions on this noncontrast enhanced study. Gallbladder is absent. There is no biliary duct dilatation.  Pancreas: There is fatty infiltration in the pancreas. No pancreatic mass or inflammatory focus is evident.  Spleen: No splenic lesions are evident.  Adrenals/Urinary Tract: Adrenals appear  normal bilaterally. There is no demonstrable mass or hydronephrosis on either side. There is a 1 mm calculus in the upper pole region with a nearby second 1 mm calculus in this region. On the left, there is a 4 x 4 mm calculus with an adjacent 1 mm calculus. There is no ureteral calculus on either side. The urinary bladder is midline with wall thickness within normal limits.  Stomach/Bowel: There are multiple sigmoid diverticula without diverticulitis. There is no bowel wall thickening or mesenteric thickening. No bowel obstruction. No free air or portal venous air.  Vascular/Lymphatic: There is no demonstrable abdominal aortic aneurysm. No vascular lesions are evident on this noncontrast enhanced study. No adenopathy is appreciable in the abdomen or pelvis. Occasional small mesenteric lymph nodes are present and regarded as nonspecific.  Reproductive: Uterus is absent. There is no pelvic mass or pelvic fluid collection.  Other: Appendix is absent. There is no abscess or ascites in  the abdomen or pelvis. There is a rather minimal ventral hernia containing only fat.  Musculoskeletal: There is extensive degenerative type change throughout lower thoracic and lumbar spine regions. There is stable ankylosis at T12-L1. There are no blastic or lytic bone lesions. There is no intramuscular or abdominal wall lesion.  IMPRESSION: Nonobstructing intrarenal calculi bilaterally, more on the left than on the right. No hydronephrosis or ureteral calculus on either side.  Colonic diverticula without diverticulitis. No bowel obstruction. No abscess. Appendix absent.  There is a moderate hiatal type hernia, stable. Small ventral hernia containing only fat.  Probable fat necrosis inferior right breast.  Prominent liver with hepatic steatosis. Gallbladder absent.  Area of apparent fat necrosis in the inferior right breast.   Electronically Signed  By: Lowella Grip III M.D.  On: 09/02/2015 09:32           Assessment & Plan:    Patient to undergo a left ESWL for the 4 x 4 mm for the left renal calculi.  1. Kidney stones:   It was explained to the patient that nonobstructing stones usually do not cause pain.  I stated that nonobstructing stones may cause pain and 2% of cases. If the stone is definitively removed, there is a high likelihood that her left flank pain will still persist.  She voices her understanding and would like the stone to undergo definitive treatment.  We discussed MET, ESWL and URS/LL/ureteral stent placement.  She stated that she did not want to and up in the emergency room again, so she would like to undergo a procedure to address the stone.  She is most comfortable with ESWL and would like to be scheduled for this procedure as she has had this in the past.  I did explain to the patient that undergoing ESWL did not guarantee she would not have to undergo URS/LL/stent placement. I stated that the ESWL may not be successful in  fragmenting the stones due to the position and size.  It is located in the lower pole of the kidney and therefore ESWL is less effective than URS/LL/stent placement.   The stone is less than 1300 Hounsfield units and this stone to skin ratio is less than 15 cm. The procedure has approximately a little over 80% chance of being effective of ridding her of the stone.  I also explained the phenomenon of [place] with the stones that would require a ureteral stent placement. Her questions were answered and she wishes to proceed with the procedure.  The 1 mm stone located in  her left kidney would not be addressed during the ESWL procedure.  I also suggested that she undergo 24-hour urine analysis once her stone has been treated because of her history of multiple stones.  We will obtain cardiac clearance prior to proceeding with ESWL.    - Urinalysis, Complete - CULTURE, URINE COMPREHENSIVE  2. Left flank pain:   See above.    3. Microscopic hematuria:   We will continue to monitor the patient's UA after the treatment/passage of the stone to ensure the hematuria has resolved.  If hematuria persists, we will pursue a hematuria workup with CT Urogram and cystoscopy if appropriate.  Return for left ESWL.  These notes generated with voice recognition software. I apologize for typographical errors.  Zara Council, [facility] [address] [place], [place] [address] [phone]

## 2015-09-10 ENCOUNTER — Encounter
Admission: RE | Admit: 2015-09-10 | Discharge: 2015-09-10 | Disposition: A | Discharge status: Home / Self Care | Payer: Medicare Other | Source: Ambulatory Visit | Attending: Urology | Admitting: Urology

## 2015-09-10 DIAGNOSIS — N200 Calculus of kidney: Secondary | ICD-10-CM | POA: Diagnosis present

## 2015-09-10 DIAGNOSIS — J449 Chronic obstructive pulmonary disease, unspecified: Secondary | ICD-10-CM | POA: Diagnosis not present

## 2015-09-10 DIAGNOSIS — I10 Essential (primary) hypertension: Secondary | ICD-10-CM | POA: Diagnosis not present

## 2015-09-10 DIAGNOSIS — J45909 Unspecified asthma, uncomplicated: Secondary | ICD-10-CM | POA: Diagnosis not present

## 2015-09-10 HISTORY — DX: Restless legs syndrome: G25.81

## 2015-09-10 HISTORY — DX: Unspecified osteoarthritis, unspecified site: M19.90

## 2015-09-10 HISTORY — DX: Herpesviral vesicular dermatitis: B00.1

## 2015-09-10 NOTE — Pre-Procedure Instructions (Signed)
Known lbbb on ekg 2016

## 2015-09-10 NOTE — Patient Instructions (Signed)
  Your procedure is scheduled on: 09/12/15 at 7:15 Report to Same Day Surgery 2nd floor medical mall   Remember: Instructions that are not followed completely may result in serious medical risk, up to and including death, or upon the discretion of your surgeon and anesthesiologist your surgery may need to be rescheduled.    _x___ 1. Do not eat food or drink liquids after midnight. No gum chewing or hard candies.     __x__ 2. No Alcohol for 24 hours before or after surgery.   __x__3. No Smoking for 24 prior to surgery.   ____  4. Bring all medications with you on the day of surgery if instructed.    __x__ 5. Notify your doctor if there is any change in your medical condition     (cold, fever, infections).     Do not wear jewelry, make-up, hairpins, clips or nail polish.  Do not wear lotions, powders, or perfumes. You may wear deodorant.  Do not shave 48 hours prior to surgery. Men may shave face and neck.  Do not bring valuables to the hospital.    [facility] is not responsible for any belongings or valuables.               Contacts, dentures or bridgework may not be worn into surgery.  Leave your suitcase in the car. After surgery it may be brought to your room.  For patients admitted to the hospital, discharge time is determined by your treatment team.   Patients discharged the day of surgery will not be allowed to drive home.    Please read over the following fact sheets that you were given:   [facility] Preparing for Surgery and or MRSA Information   _x___ Take these medicines the morning of surgery with A SIP OF WATER:    1. budesonide-formoterol (SYMBICORT) 160-4.5 MCG/ACT inhaler  2.citalopram (CELEXA) 10 MG tablet  3.diltiazem (DILACOR XR) 180 MG 24 hr capsule  4.DULoxetine (CYMBALTA) 30 MG capsule  5.gabapentin (NEURONTIN) 400 MG capsule  6.Ipratropium-Albuterol (COMBIVENT) 20-100 MCG/ACT AERS respimat and bring  7.omeprazole (PRILOSEC) 20 MG capsule  8.tiotropium  (SPIRIVA) 18 MCG inhalation capsule  9.oxyCODONE-acetaminophen (ROXICET) 5-325 MG tablet ____ Fleet Enema (as directed)   ____ Use CHG Soap or sage wipes as directed on instruction sheet   _x___ Use inhalers on the day of surgery and bring to hospital day of surgery  ____ Stop metformin 2 days prior to surgery    ____ Take 1/2 of usual insulin dose the night before surgery and none on the morning of           surgery.   ____ Stop aspirin or coumadin, or plavix  _x__ Stop Anti-inflammatories such as Advil, Aleve, Ibuprofen, Motrin, Naproxen,          Naprosyn, Goodies powders or aspirin products. Ok to take Tylenol.   ____ Stop supplements until after surgery.    ____ Bring C-Pap to the hospital.

## 2015-09-11 ENCOUNTER — Telehealth: Payer: Self-pay | Admitting: Radiology

## 2015-09-11 LAB — CULTURE, URINE COMPREHENSIVE

## 2015-09-11 NOTE — Pre-Procedure Instructions (Deleted)
Notified anesthesia nurse regarding potassium level of 5.9.

## 2015-09-11 NOTE — Telephone Encounter (Signed)
Pt called c/o swollen labia. States it could be an ingrown hair. She has been using ice & heat as well as antibiotic cream which has reduced swelling. Asks if she can proceed with ESWL scheduled 09/12/15. Per Dr Erlene Quan, advised pt she may proceed with ESWL & she should soak in the bath tub. Pt voices understanding.

## 2015-09-12 ENCOUNTER — Ambulatory Visit
Admission: RE | Admit: 2015-09-12 | Discharge: 2015-09-12 | Disposition: A | Discharge status: Home / Self Care | Payer: Medicare Other | Source: Ambulatory Visit | Attending: Urology | Admitting: Urology

## 2015-09-12 ENCOUNTER — Ambulatory Visit: Payer: Medicare Other

## 2015-09-12 ENCOUNTER — Encounter: Payer: Self-pay | Admitting: *Deleted

## 2015-09-12 ENCOUNTER — Encounter
Admission: RE | Disposition: A | Discharge status: Home / Self Care | Payer: Self-pay | Source: Ambulatory Visit | Attending: Urology

## 2015-09-12 DIAGNOSIS — N200 Calculus of kidney: Secondary | ICD-10-CM | POA: Diagnosis not present

## 2015-09-12 DIAGNOSIS — J449 Chronic obstructive pulmonary disease, unspecified: Secondary | ICD-10-CM | POA: Diagnosis not present

## 2015-09-12 DIAGNOSIS — J45909 Unspecified asthma, uncomplicated: Secondary | ICD-10-CM | POA: Diagnosis not present

## 2015-09-12 DIAGNOSIS — I10 Essential (primary) hypertension: Secondary | ICD-10-CM | POA: Diagnosis not present

## 2015-09-12 HISTORY — PX: EXTRACORPOREAL SHOCK WAVE LITHOTRIPSY: SHX1557

## 2015-09-12 LAB — GLUCOSE, CAPILLARY: Glucose-Capillary: 102 mg/dL — ABNORMAL HIGH (ref 65–99)

## 2015-09-12 SURGERY — LITHOTRIPSY, ESWL
Anesthesia: Moderate Sedation | Laterality: Left

## 2015-09-12 MED ORDER — CIPROFLOXACIN HCL 500 MG PO TABS
500.0000 mg | ORAL_TABLET | Freq: Once | ORAL | Status: AC
  Administered 2015-09-12: 500 mg via ORAL

## 2015-09-12 MED ORDER — DIPHENHYDRAMINE HCL 25 MG PO CAPS
25.0000 mg | ORAL_CAPSULE | ORAL | Status: AC
  Administered 2015-09-12: 25 mg via ORAL

## 2015-09-12 MED ORDER — DIAZEPAM 5 MG PO TABS
ORAL_TABLET | ORAL | Status: AC
  Administered 2015-09-12: 10 mg via ORAL
  Filled 2015-09-12: qty 2

## 2015-09-12 MED ORDER — OXYCODONE-ACETAMINOPHEN 5-325 MG PO TABS: 1.0000 | ORAL_TABLET | Freq: Four times a day (QID) | ORAL | Status: DC | PRN

## 2015-09-12 MED ORDER — DIAZEPAM 5 MG PO TABS
10.0000 mg | ORAL_TABLET | ORAL | Status: AC
  Administered 2015-09-12: 10 mg via ORAL

## 2015-09-12 MED ORDER — DIPHENHYDRAMINE HCL 25 MG PO CAPS
ORAL_CAPSULE | ORAL | Status: AC
  Administered 2015-09-12: 25 mg via ORAL
  Filled 2015-09-12: qty 1

## 2015-09-12 MED ORDER — CIPROFLOXACIN HCL 500 MG PO TABS
ORAL_TABLET | ORAL | Status: AC
  Administered 2015-09-12: 500 mg via ORAL
  Filled 2015-09-12: qty 1

## 2015-09-12 MED ORDER — DEXTROSE-NACL 5-0.45 % IV SOLN
INTRAVENOUS | Status: DC
  Administered 2015-09-12: 100 mL/h via INTRAVENOUS

## 2015-09-12 NOTE — OR Nursing (Signed)
Patient states she does not have high sugars but runs low sugars mostly.  Today her fasting blood sugar is 102.  Patient has been NPO since last evening.

## 2015-09-12 NOTE — Discharge Instructions (Signed)
See [facility] discharge instructions in chart.   AMBULATORY SURGERY  DISCHARGE INSTRUCTIONS   1) The drugs that you were given will stay in your system until tomorrow so for the next 24 hours you should not:  A) Drive an automobile B) Make any legal decisions C) Drink any alcoholic beverage   2) You may resume regular meals tomorrow.  Today it is better to start with liquids and gradually work up to solid foods.  You may eat anything you prefer, but it is better to start with liquids, then soup and crackers, and gradually work up to solid foods.   3) Please notify your doctor immediately if you have any unusual bleeding, trouble breathing, redness and pain at the surgery site, drainage, fever, or pain not relieved by medication.    4) Additional Instructions: Drink plenty fluids especially water.  Avoid caffeine.    Please contact your physician with any problems or Same Day Surgery at [phone], Monday through Friday 6 am to 4 pm, or [place] at [facility] number at [phone].

## 2015-09-26 ENCOUNTER — Ambulatory Visit
Admission: RE | Admit: 2015-09-26 | Discharge: 2015-09-26 | Disposition: A | Discharge status: Home / Self Care | Payer: Medicare Other | Source: Ambulatory Visit | Attending: Urology | Admitting: Urology

## 2015-09-26 ENCOUNTER — Encounter: Payer: Self-pay | Admitting: Urology

## 2015-09-26 ENCOUNTER — Ambulatory Visit (INDEPENDENT_AMBULATORY_CARE_PROVIDER_SITE_OTHER): Payer: Medicare Other | Admitting: Urology

## 2015-09-26 VITALS — BP 118/75 | HR 105 | Ht 63.0 in | Wt 174.0 lb

## 2015-09-26 DIAGNOSIS — N200 Calculus of kidney: Secondary | ICD-10-CM | POA: Insufficient documentation

## 2015-09-26 DIAGNOSIS — R3129 Other microscopic hematuria: Secondary | ICD-10-CM

## 2015-09-26 DIAGNOSIS — R109 Unspecified abdominal pain: Secondary | ICD-10-CM

## 2015-09-26 MED ORDER — OXYCODONE-ACETAMINOPHEN 5-325 MG PO TABS: 1.0000 | ORAL_TABLET | Freq: Four times a day (QID) | ORAL | Status: DC | PRN

## 2015-09-26 MED ORDER — TAMSULOSIN HCL 0.4 MG PO CAPS: 0.4000 mg | ORAL_CAPSULE | Freq: Every day | ORAL | Status: DC

## 2015-09-26 NOTE — Progress Notes (Signed)
2:44 PM   LEILANI POLSTER [birth year] [id number]  Referring provider: Lavera Guise, MD [address] [place], [place] [address]  Chief Complaint  Patient presents with  . Routine Post Op    2wk post litho w/KUB    HPI: Patient is a 60 year old WF who presents today for follow KUB after undergoing ESWL for a left renal stone on  09/12/2015 with Dr. Erlene Quan.    Background history Patient with a history of nephrolithiasis who is referred to [place] by [facility] ED for bilateral nephrolithiasis and left-sided flank pain.    She states that approximately 2 weeks ago she started experiencing severe left-sided flank pain. She states the pain was so intense she felt like it was "killing her."  She described the pain as feeling like she was being poked with several needles all over her left flank. It then radiated into her left side.  She stated that she began to become nauseous and started to vomit.  She fell that with this symptomatology was reminiscent of previous stones that she has had in the past, so she sought treatment in the emergency department on 09/02/2015.  In the ED, she was given IV fluids, IV analgesics and IV Toradol. A CT renal stone study performed that day noted a 4 x 4 millimeter calculus in the left kidney with an adjacent 1 mm calculus. She also had to 1 mm stones in her right kidney.  No hydronephrosis of ureteral calculi were seen on either side. I independently reviewed the films and reviewed them with the patient.  Her UA from that day and noted TNTC rbc's per high-power field and TNTC wbc's per high-powered field. This was most likely contaminated specimen due to too numerous to count squamous epithelial were seen as well. Urine culture surprisingly grew out multiple organisms from that day. She was given Percocet, Zofran and Keflex and instructed to follow up with urology.  Her post procedure course was as expected.  She is still experiencing left  flank pain.  She has passed a fragment.  She denies fevers, chills, nausea or vomiting.  Her pain is a 5/10, but sometime can reach the level of 10/10. She is not having gross hematuria or dysuria.  KUB demonstrates an unchanged position of stones.    PMH: Past Medical History  Diagnosis Date  . Hypertension   . Migraine   . Anxiety   . Acid reflux   . Yeast vaginitis   . Left flank pain   . Hematuria   . Depression   . Fever blister   . Restless leg   . Arrhythmia     treated with meds and has no current problems  . Diabetes mellitus without complication (HCC)     sugars run low, not high sugars  . Asthma     uses several inhalers  . Kidney stone     stones, 2nd lithotripsy  . Arthritis     most uncomfortable in knees    Surgical History: Past Surgical History  Procedure Laterality Date  . Ureteroscopy    . Laparoscopic hysterectomy    . Lithotripsy    . Hh repair    . Cardiac electrophysiology study and ablation    . Appendectomy  1990  . Cholecystectomy  1990  . Joint replacement Bilateral [id number]    total knees  . Extracorporeal shock wave lithotripsy Left 09/12/2015    Procedure: EXTRACORPOREAL SHOCK WAVE LITHOTRIPSY (ESWL);  Surgeon: Caryl Pina  Erlene Quan, MD;  Location: [facility] ORS;  Service: Urology;  Laterality: Left;    Home Medications:    Medication List       This list is accurate as of: 09/26/15  2:44 PM.  Always use your most recent med list.               amitriptyline 50 MG tablet  Commonly known as:  ELAVIL  Take 50 mg by mouth at bedtime.     azelastine 0.1 % nasal spray  Commonly known as:  ASTELIN  Place 1 spray into both nostrils at bedtime.     budesonide-formoterol 160-4.5 MCG/ACT inhaler  Commonly known as:  SYMBICORT  Inhale 2 puffs into the lungs 2 (two) times daily.     butalbital-acetaminophen-caffeine 50-325-40 MG tablet  Commonly known as:  FIORICET, ESGIC     cetirizine 10 MG tablet  Commonly known as:  ZYRTEC  Take 10 mg by  mouth daily.     ciprofloxacin 500 MG tablet  Commonly known as:  CIPRO  Take 500 mg by mouth 2 (two) times daily.     citalopram 10 MG tablet  Commonly known as:  CELEXA  Take 10 mg by mouth daily.     cyclobenzaprine 10 MG tablet  Commonly known as:  FLEXERIL  Take 1 tablet (10 mg total) by mouth every 8 (eight) hours as needed for muscle spasms.     diclofenac sodium 1 % Gel  Commonly known as:  VOLTAREN  Apply 2 g topically 2 (two) times daily as needed (for pain).     diltiazem 180 MG 24 hr capsule  Commonly known as:  DILACOR XR  Take 180 mg by mouth daily.     diphenhydrAMINE 25 MG tablet  Commonly known as:  BENADRYL  Take 25 mg by mouth every 6 (six) hours as needed.     DULoxetine 30 MG capsule  Commonly known as:  CYMBALTA  Take 30-60 mg by mouth 2 (two) times daily. 30 mg every morning and 60 mg at bedtime     EPINEPHrine 0.3 mg/0.3 mL Soaj injection  Commonly known as:  EPI-PEN  Inject 0.3 mg into the muscle once.     estrogens (conjugated) 0.45 MG tablet  Commonly known as:  PREMARIN  Take 0.45 mg by mouth every evening. Reported on 09/09/2015     fluticasone 50 MCG/ACT nasal spray  Commonly known as:  FLONASE  Place 2 sprays into both nostrils daily.     gabapentin 400 MG capsule  Commonly known as:  NEURONTIN  Take 400 mg by mouth 2 (two) times daily.     Ipratropium-Albuterol 20-100 MCG/ACT Aers respimat  Commonly known as:  COMBIVENT  Inhale 1 puff into the lungs 4 (four) times daily.     ipratropium-albuterol 0.5-2.5 (3) MG/3ML Soln  Commonly known as:  DUONEB  Take 3 mLs by nebulization every 4 (four) hours as needed (for shortness of breath/wheezing).     montelukast 10 MG tablet  Commonly known as:  SINGULAIR  Take 10 mg by mouth at bedtime.     multivitamin with minerals Tabs tablet  Take 1 tablet by mouth daily.     Olopatadine HCl 0.2 % Soln  Place 1 drop into both eyes at bedtime.     omeprazole 20 MG capsule  Commonly known  as:  PRILOSEC  Take 20 mg by mouth 2 (two) times daily.     ondansetron 4 MG disintegrating tablet  Commonly known as:  ZOFRAN ODT  Take 1 tablet (4 mg total) by mouth every 8 (eight) hours as needed for nausea or vomiting.     oxyCODONE 5 MG immediate release tablet  Commonly known as:  Oxy IR/ROXICODONE  Reported on 09/09/2015     oxyCODONE-acetaminophen 5-325 MG tablet  Commonly known as:  PERCOCET  Take 1 tablet by mouth every 6 (six) hours as needed for severe pain.     REXULTI 4 MG Tabs  Generic drug:  Brexpiprazole  Take 4 mg by mouth at bedtime.     rOPINIRole 0.25 MG tablet  Commonly known as:  REQUIP  Take 0.25 mg by mouth 2 (two) times daily.     tamsulosin 0.4 MG Caps capsule  Commonly known as:  FLOMAX  Take 1 capsule (0.4 mg total) by mouth daily.     tiotropium 18 MCG inhalation capsule  Commonly known as:  SPIRIVA  Place 18 mcg into inhaler and inhale daily.     topiramate 50 MG tablet  Commonly known as:  TOPAMAX  Take 50 mg by mouth at bedtime. Reported on 09/12/2015     traMADol 50 MG tablet  Commonly known as:  ULTRAM  Take 50-100 mg by mouth every 6 (six) hours as needed for moderate pain or severe pain. Reported on 09/12/2015     traZODone 100 MG tablet  Commonly known as:  DESYREL  Take 100 mg by mouth at bedtime. Reported on 09/12/2015     valACYclovir 500 MG tablet  Commonly known as:  VALTREX  Take 500 mg by mouth 2 (two) times daily as needed (for fever blisters).     VITAMIN D-1000 MAX ST 1000 units tablet  Generic drug:  Cholecalciferol  Take by mouth.        Allergies:  Allergies  Allergen Reactions  . Librium [Chlordiazepoxide] Shortness Of Breath  . Nsaids     Other reaction(s): Other (See Comments) Other Reaction: Allergy  . Vanilla Shortness Of Breath  . Aspirin Hives  . Azithromycin     Other reaction(s): Unknown  . Buprenorphine Hcl   . Chlordiazepoxide Hcl     Other reaction(s): OTHER  . Iron     Other reaction(s):  Unknown  . Morphine And Related   . Reglan [Metoclopramide] Hives  . Sulfa Antibiotics     Other reaction(s): Unknown  . Tolmetin     Other reaction(s): Other (See Comments) Other Reaction: Allergy    Family History: Family History  Problem Relation Age of Onset  . Prostate cancer Father   . Kidney Stones Father   . Kidney disease Neg Hx     Social History:  reports that she has never smoked. She does not have any smokeless tobacco history on file. She reports that she does not drink alcohol or use illicit drugs.  ROS: UROLOGY Frequent Urination?: No Hard to postpone urination?: No Burning/pain with urination?: No Get up at night to urinate?: No Leakage of urine?: No Urine stream starts and stops?: No Trouble starting stream?: No Do you have to strain to urinate?: No Blood in urine?: No Urinary tract infection?: No Sexually transmitted disease?: No Injury to kidneys or bladder?: No Painful intercourse?: No Weak stream?: No Currently pregnant?: No Vaginal bleeding?: No Last menstrual period?: n  Gastrointestinal Nausea?: Yes Vomiting?: No Indigestion/heartburn?: No Diarrhea?: No Constipation?: No  Constitutional Fever: No Night sweats?: No Weight loss?: No Fatigue?: No  Skin Skin rash/lesions?: No Itching?: No  Eyes Blurred vision?: No Double vision?:  No  Ears/Nose/Throat Sore throat?: No Sinus problems?: No  Hematologic/Lymphatic Swollen glands?: No Easy bruising?: No  Cardiovascular Leg swelling?: No Chest pain?: No  Respiratory Cough?: No Shortness of breath?: No  Endocrine Excessive thirst?: No  Musculoskeletal Back pain?: Yes Joint pain?: No  Neurological Headaches?: No Dizziness?: No  Psychologic Depression?: Yes Anxiety?: No  Physical Exam: BP 118/75 mmHg  Pulse 105  Ht 5\' 3"  (1.6 m)  Wt 174 lb (78.926 kg)  BMI 30.83 kg/m2  Constitutional: Well nourished. Alert and oriented, No acute distress. HEENT: [place] AT,  moist mucus membranes. Trachea midline, no masses. Cardiovascular: No clubbing, cyanosis, or edema. Respiratory: Normal respiratory effort, no increased work of breathing. GI: Abdomen is soft, non tender, non distended, no abdominal masses. Liver and spleen not palpable.  No hernias appreciated.  Stool sample for occult testing is not indicated.   GU: Left CVA tenderness.  No bladder fullness or masses.   Skin: No rashes, bruises or suspicious lesions. Lymph: No cervical or inguinal adenopathy. Neurologic: Grossly intact, no focal deficits, moving all 4 extremities. Psychiatric: Normal mood and affect.  Laboratory Data: Lab Results  Component Value Date   WBC 7.3 09/02/2015   HGB 13.1 09/02/2015   HCT 39.6 09/02/2015   MCV 89.4 09/02/2015   PLT 230 09/02/2015    Lab Results  Component Value Date   CREATININE 0.66 09/02/2015    Lab Results  Component Value Date   AST 24 06/18/2015   Lab Results  Component Value Date   ALT 35 06/18/2015       Pertinent Imaging: CLINICAL DATA: Kidney calculi. Right upper and left upper quadrant pain  EXAM: ABDOMEN - 1 VIEW  COMPARISON: 09/12/2015  FINDINGS: Bilateral upper pole renal calculi as seen on recent CT, punctate on the right and 4 mm on the left. No evidence ureteral calculus. Normal bowel gas pattern. No concerning intra-abdominal mass effect or calcification.  IMPRESSION: Bilateral nephrolithiasis with stable appearance from 09/12/2015. No evidence of ureteral stone.   Electronically Signed  By: Monte Fantasia M.D.  On: 09/26/2015 13:33   Assessment & Plan:    Patient to underwent a left ESWL for the 4 x 4 mm for the left renal calculi on 06/22/107 with Dr. Erlene Quan.  1. Kidney stones:   Patient has passed one fragment.  The KUB demonstrates no changes.  We will send the stone for analysis.  She will continue increasing fluids and lay in [place] to encourage passage of stone.    2. Left  flank pain:   See above.  Refill given for Percocet 5/325.    3. Microscopic hematuria:   We will continue to monitor the patient's UA after the treatment/passage of the stone to ensure the hematuria has resolved.  If hematuria persists, we will pursue a hematuria workup with CT Urogram and cystoscopy if appropriate.  Return in about 1 month (around 10/27/2015) for KUB.  These notes generated with voice recognition software. I apologize for typographical errors.  Zara Council, [facility] [address] [place], [place] [address] [phone]

## 2015-10-03 DIAGNOSIS — N200 Calculus of kidney: Secondary | ICD-10-CM | POA: Diagnosis not present

## 2015-10-09 DIAGNOSIS — N200 Calculus of kidney: Secondary | ICD-10-CM | POA: Diagnosis not present

## 2015-10-09 DIAGNOSIS — Z0001 Encounter for general adult medical examination with abnormal findings: Secondary | ICD-10-CM | POA: Diagnosis not present

## 2015-10-09 DIAGNOSIS — M545 Low back pain: Secondary | ICD-10-CM | POA: Diagnosis not present

## 2015-10-09 DIAGNOSIS — N390 Urinary tract infection, site not specified: Secondary | ICD-10-CM | POA: Diagnosis not present

## 2015-10-10 ENCOUNTER — Other Ambulatory Visit: Payer: Self-pay | Admitting: Nurse Practitioner

## 2015-10-10 DIAGNOSIS — Z1231 Encounter for screening mammogram for malignant neoplasm of breast: Secondary | ICD-10-CM

## 2015-10-11 ENCOUNTER — Inpatient Hospital Stay
Admission: RE | Admit: 2015-10-11 | Discharge: 2015-10-11 | Disposition: A | Discharge status: Home / Self Care | Payer: Self-pay | Source: Ambulatory Visit | Attending: *Deleted | Admitting: *Deleted

## 2015-10-11 ENCOUNTER — Other Ambulatory Visit: Payer: Self-pay | Admitting: *Deleted

## 2015-10-11 DIAGNOSIS — Z9289 Personal history of other medical treatment: Secondary | ICD-10-CM

## 2015-10-15 ENCOUNTER — Other Ambulatory Visit: Payer: Self-pay | Admitting: Urology

## 2015-10-16 ENCOUNTER — Other Ambulatory Visit: Payer: Self-pay | Admitting: Urology

## 2015-10-17 ENCOUNTER — Inpatient Hospital Stay
Admission: RE | Admit: 2015-10-17 | Discharge: 2015-10-17 | Disposition: A | Discharge status: Home / Self Care | Payer: Self-pay | Source: Ambulatory Visit | Attending: *Deleted | Admitting: *Deleted

## 2015-10-17 ENCOUNTER — Other Ambulatory Visit: Payer: Self-pay | Admitting: *Deleted

## 2015-10-17 DIAGNOSIS — Z9289 Personal history of other medical treatment: Secondary | ICD-10-CM

## 2015-10-23 DIAGNOSIS — I10 Essential (primary) hypertension: Secondary | ICD-10-CM | POA: Diagnosis not present

## 2015-10-24 ENCOUNTER — Ambulatory Visit: Payer: Medicare Other | Admitting: Urology

## 2015-10-24 ENCOUNTER — Ambulatory Visit: Payer: Medicare Other

## 2015-11-01 DIAGNOSIS — N390 Urinary tract infection, site not specified: Secondary | ICD-10-CM | POA: Diagnosis not present

## 2015-11-01 DIAGNOSIS — R300 Dysuria: Secondary | ICD-10-CM | POA: Diagnosis not present

## 2015-11-04 ENCOUNTER — Ambulatory Visit: Payer: Medicare Other | Attending: Nurse Practitioner

## 2015-11-07 DIAGNOSIS — K219 Gastro-esophageal reflux disease without esophagitis: Secondary | ICD-10-CM | POA: Diagnosis not present

## 2015-11-07 DIAGNOSIS — K449 Diaphragmatic hernia without obstruction or gangrene: Secondary | ICD-10-CM | POA: Diagnosis not present

## 2015-11-07 DIAGNOSIS — R05 Cough: Secondary | ICD-10-CM | POA: Diagnosis not present

## 2015-11-12 DIAGNOSIS — N390 Urinary tract infection, site not specified: Secondary | ICD-10-CM | POA: Diagnosis not present

## 2015-11-12 DIAGNOSIS — I10 Essential (primary) hypertension: Secondary | ICD-10-CM | POA: Diagnosis not present

## 2015-11-14 DIAGNOSIS — K219 Gastro-esophageal reflux disease without esophagitis: Secondary | ICD-10-CM | POA: Diagnosis not present

## 2015-11-14 DIAGNOSIS — K222 Esophageal obstruction: Secondary | ICD-10-CM | POA: Diagnosis not present

## 2015-12-05 DIAGNOSIS — L500 Allergic urticaria: Secondary | ICD-10-CM | POA: Diagnosis not present

## 2015-12-05 DIAGNOSIS — J4540 Moderate persistent asthma, uncomplicated: Secondary | ICD-10-CM | POA: Diagnosis not present

## 2015-12-05 DIAGNOSIS — J0191 Acute recurrent sinusitis, unspecified: Secondary | ICD-10-CM | POA: Diagnosis not present

## 2015-12-05 DIAGNOSIS — I10 Essential (primary) hypertension: Secondary | ICD-10-CM | POA: Diagnosis not present

## 2015-12-23 DIAGNOSIS — J4540 Moderate persistent asthma, uncomplicated: Secondary | ICD-10-CM | POA: Diagnosis not present

## 2015-12-23 DIAGNOSIS — J301 Allergic rhinitis due to pollen: Secondary | ICD-10-CM | POA: Diagnosis not present

## 2015-12-23 DIAGNOSIS — H60552 Acute reactive otitis externa, left ear: Secondary | ICD-10-CM | POA: Diagnosis not present

## 2016-01-09 DIAGNOSIS — J3481 Nasal mucositis (ulcerative): Secondary | ICD-10-CM | POA: Diagnosis not present

## 2016-01-09 DIAGNOSIS — I10 Essential (primary) hypertension: Secondary | ICD-10-CM | POA: Diagnosis not present

## 2016-01-17 DIAGNOSIS — R0602 Shortness of breath: Secondary | ICD-10-CM | POA: Diagnosis not present

## 2016-01-20 ENCOUNTER — Encounter: Payer: Self-pay | Admitting: Emergency Medicine

## 2016-01-20 ENCOUNTER — Emergency Department: Payer: Medicare Other

## 2016-01-20 ENCOUNTER — Inpatient Hospital Stay
Admission: EM | Admit: 2016-01-20 | Discharge: 2016-01-22 | DRG: 190 | Disposition: A | Discharge status: Home / Self Care | Payer: Medicare Other | Attending: Internal Medicine | Admitting: Internal Medicine

## 2016-01-20 DIAGNOSIS — Z9049 Acquired absence of other specified parts of digestive tract: Secondary | ICD-10-CM

## 2016-01-20 DIAGNOSIS — E119 Type 2 diabetes mellitus without complications: Secondary | ICD-10-CM | POA: Diagnosis present

## 2016-01-20 DIAGNOSIS — Z79899 Other long term (current) drug therapy: Secondary | ICD-10-CM

## 2016-01-20 DIAGNOSIS — J441 Chronic obstructive pulmonary disease with (acute) exacerbation: Secondary | ICD-10-CM

## 2016-01-20 DIAGNOSIS — Z809 Family history of malignant neoplasm, unspecified: Secondary | ICD-10-CM | POA: Diagnosis not present

## 2016-01-20 DIAGNOSIS — T380X5A Adverse effect of glucocorticoids and synthetic analogues, initial encounter: Secondary | ICD-10-CM | POA: Diagnosis present

## 2016-01-20 DIAGNOSIS — I10 Essential (primary) hypertension: Secondary | ICD-10-CM | POA: Diagnosis present

## 2016-01-20 DIAGNOSIS — R0602 Shortness of breath: Secondary | ICD-10-CM | POA: Diagnosis not present

## 2016-01-20 DIAGNOSIS — M1990 Unspecified osteoarthritis, unspecified site: Secondary | ICD-10-CM | POA: Diagnosis present

## 2016-01-20 DIAGNOSIS — J9601 Acute respiratory failure with hypoxia: Secondary | ICD-10-CM | POA: Diagnosis present

## 2016-01-20 DIAGNOSIS — J45909 Unspecified asthma, uncomplicated: Secondary | ICD-10-CM | POA: Diagnosis present

## 2016-01-20 DIAGNOSIS — F329 Major depressive disorder, single episode, unspecified: Secondary | ICD-10-CM | POA: Diagnosis present

## 2016-01-20 DIAGNOSIS — G2581 Restless legs syndrome: Secondary | ICD-10-CM | POA: Diagnosis present

## 2016-01-20 DIAGNOSIS — F419 Anxiety disorder, unspecified: Secondary | ICD-10-CM | POA: Diagnosis present

## 2016-01-20 DIAGNOSIS — Z7951 Long term (current) use of inhaled steroids: Secondary | ICD-10-CM | POA: Diagnosis not present

## 2016-01-20 DIAGNOSIS — K219 Gastro-esophageal reflux disease without esophagitis: Secondary | ICD-10-CM | POA: Diagnosis present

## 2016-01-20 DIAGNOSIS — Z841 Family history of disorders of kidney and ureter: Secondary | ICD-10-CM | POA: Diagnosis not present

## 2016-01-20 DIAGNOSIS — Z96653 Presence of artificial knee joint, bilateral: Secondary | ICD-10-CM | POA: Diagnosis present

## 2016-01-20 DIAGNOSIS — M6281 Muscle weakness (generalized): Secondary | ICD-10-CM

## 2016-01-20 DIAGNOSIS — Z23 Encounter for immunization: Secondary | ICD-10-CM | POA: Diagnosis not present

## 2016-01-20 DIAGNOSIS — J45901 Unspecified asthma with (acute) exacerbation: Secondary | ICD-10-CM | POA: Diagnosis not present

## 2016-01-20 LAB — COMPREHENSIVE METABOLIC PANEL
ALT: 17 U/L (ref 14–54)
AST: 21 U/L (ref 15–41)
Albumin: 3.8 g/dL (ref 3.5–5.0)
Alkaline Phosphatase: 103 U/L (ref 38–126)
Anion gap: 9 (ref 5–15)
BUN: 13 mg/dL (ref 6–20)
CO2: 27 mmol/L (ref 22–32)
Calcium: 9.5 mg/dL (ref 8.9–10.3)
Chloride: 102 mmol/L (ref 101–111)
Creatinine, Ser: 0.75 mg/dL (ref 0.44–1.00)
GFR calc Af Amer: >60 mL/min (ref >60)
GFR calc non Af Amer: >60 mL/min (ref >60)
Glucose, Bld: 117 mg/dL — ABNORMAL HIGH (ref 65–99)
Potassium: 4.2 mmol/L (ref 3.5–5.1)
Sodium: 138 mmol/L (ref 135–145)
Total Bilirubin: 0.3 mg/dL (ref 0.3–1.2)
Total Protein: 7.3 g/dL (ref 6.5–8.1)

## 2016-01-20 LAB — CBC WITH DIFFERENTIAL/PLATELET
Basophils Absolute: 0.1 10*3/uL (ref 0–0.1)
Basophils Relative: 1 %
Eosinophils Absolute: 0.6 10*3/uL (ref 0–0.7)
Eosinophils Relative: 6 %
HCT: 42.1 % (ref 35.0–47.0)
Hemoglobin: 14.4 g/dL (ref 12.0–16.0)
Lymphocytes Relative: 30 %
Lymphs Abs: 2.9 10*3/uL (ref 1.0–3.6)
MCH: 30.5 pg (ref 26.0–34.0)
MCHC: 34.1 g/dL (ref 32.0–36.0)
MCV: 89.4 fL (ref 80.0–100.0)
Monocytes Absolute: 0.4 10*3/uL (ref 0.2–0.9)
Monocytes Relative: 4 %
Neutro Abs: 5.7 10*3/uL (ref 1.4–6.5)
Neutrophils Relative %: 59 %
Platelets: 288 10*3/uL (ref 150–440)
RBC: 4.7 MIL/uL (ref 3.80–5.20)
RDW: 14.9 % — ABNORMAL HIGH (ref 11.5–14.5)
WBC: 9.7 10*3/uL (ref 3.6–11.0)

## 2016-01-20 LAB — GLUCOSE, CAPILLARY
Glucose-Capillary: 181 mg/dL — ABNORMAL HIGH (ref 65–99)
Glucose-Capillary: 201 mg/dL — ABNORMAL HIGH (ref 65–99)

## 2016-01-20 LAB — MRSA PCR SCREENING: MRSA by PCR: NEGATIVE

## 2016-01-20 LAB — BRAIN NATRIURETIC PEPTIDE: B Natriuretic Peptide: 11 pg/mL (ref 0.0–100.0)

## 2016-01-20 LAB — TROPONIN I: Troponin I: <0.03 ng/mL (ref <0.03)

## 2016-01-20 MED ORDER — SENNOSIDES-DOCUSATE SODIUM 8.6-50 MG PO TABS: 1.0000 | ORAL_TABLET | Freq: Every evening | ORAL | Status: DC | PRN

## 2016-01-20 MED ORDER — PANTOPRAZOLE SODIUM 40 MG PO TBEC
40.0000 mg | DELAYED_RELEASE_TABLET | Freq: Every day | ORAL | Status: DC
  Administered 2016-01-20 – 2016-01-22 (×3): 40 mg via ORAL
  Filled 2016-01-20 (×3): qty 1

## 2016-01-20 MED ORDER — PNEUMOCOCCAL VAC POLYVALENT 25 MCG/0.5ML IJ INJ
0.5000 mL | INJECTION | INTRAMUSCULAR | Status: AC
  Administered 2016-01-21: 0.5 mL via INTRAMUSCULAR
  Filled 2016-01-20: qty 0.5

## 2016-01-20 MED ORDER — DILTIAZEM HCL ER COATED BEADS 180 MG PO CP24
180.0000 mg | ORAL_CAPSULE | Freq: Every day | ORAL | Status: DC
  Administered 2016-01-20 – 2016-01-22 (×3): 180 mg via ORAL
  Filled 2016-01-20 (×5): qty 1

## 2016-01-20 MED ORDER — ENOXAPARIN SODIUM 40 MG/0.4ML ~~LOC~~ SOLN
40.0000 mg | SUBCUTANEOUS | Status: DC
  Administered 2016-01-20 – 2016-01-21 (×2): 40 mg via SUBCUTANEOUS
  Filled 2016-01-20 (×2): qty 0.4

## 2016-01-20 MED ORDER — GABAPENTIN 400 MG PO CAPS
400.0000 mg | ORAL_CAPSULE | Freq: Two times a day (BID) | ORAL | Status: DC
  Administered 2016-01-20 – 2016-01-22 (×4): 400 mg via ORAL
  Filled 2016-01-20 (×4): qty 1

## 2016-01-20 MED ORDER — TRAMADOL HCL 50 MG PO TABS
50.0000 mg | ORAL_TABLET | Freq: Four times a day (QID) | ORAL | Status: DC | PRN
  Administered 2016-01-21: 100 mg via ORAL
  Filled 2016-01-20: qty 2

## 2016-01-20 MED ORDER — ONDANSETRON HCL 4 MG PO TABS: 4.0000 mg | ORAL_TABLET | Freq: Four times a day (QID) | ORAL | Status: DC | PRN

## 2016-01-20 MED ORDER — TIOTROPIUM BROMIDE MONOHYDRATE 18 MCG IN CAPS: 18.0000 ug | ORAL_CAPSULE | Freq: Every day | RESPIRATORY_TRACT | Status: DC

## 2016-01-20 MED ORDER — INSULIN ASPART 100 UNIT/ML ~~LOC~~ SOLN
0.0000 [IU] | Freq: Three times a day (TID) | SUBCUTANEOUS | Status: DC
  Administered 2016-01-20 – 2016-01-21 (×3): 4 [IU] via SUBCUTANEOUS
  Administered 2016-01-21: 11 [IU] via SUBCUTANEOUS
  Administered 2016-01-22: 4 [IU] via SUBCUTANEOUS
  Filled 2016-01-20 (×3): qty 4
  Filled 2016-01-20: qty 11

## 2016-01-20 MED ORDER — TRAZODONE HCL 100 MG PO TABS
100.0000 mg | ORAL_TABLET | Freq: Every day | ORAL | Status: DC
  Administered 2016-01-20 – 2016-01-21 (×2): 100 mg via ORAL
  Filled 2016-01-20 (×2): qty 1

## 2016-01-20 MED ORDER — ROPINIROLE HCL 0.25 MG PO TABS
0.2500 mg | ORAL_TABLET | Freq: Two times a day (BID) | ORAL | Status: DC
  Administered 2016-01-20 – 2016-01-21 (×2): 0.25 mg via ORAL
  Filled 2016-01-20 (×4): qty 1

## 2016-01-20 MED ORDER — METHYLPREDNISOLONE SODIUM SUCC 125 MG IJ SOLR
60.0000 mg | Freq: Three times a day (TID) | INTRAMUSCULAR | Status: DC
  Administered 2016-01-20 – 2016-01-21 (×3): 60 mg via INTRAVENOUS
  Filled 2016-01-20 (×3): qty 2

## 2016-01-20 MED ORDER — IPRATROPIUM-ALBUTEROL 0.5-2.5 (3) MG/3ML IN SOLN
3.0000 mL | Freq: Once | RESPIRATORY_TRACT | Status: AC
  Administered 2016-01-20: 3 mL via RESPIRATORY_TRACT
  Filled 2016-01-20: qty 3

## 2016-01-20 MED ORDER — ESTROGENS CONJUGATED 0.45 MG PO TABS
0.4500 mg | ORAL_TABLET | Freq: Every evening | ORAL | Status: DC
  Administered 2016-01-20 – 2016-01-21 (×2): 0.45 mg via ORAL
  Filled 2016-01-20 (×3): qty 1

## 2016-01-20 MED ORDER — OLOPATADINE HCL 0.1 % OP SOLN
1.0000 [drp] | Freq: Two times a day (BID) | OPHTHALMIC | Status: DC
  Administered 2016-01-20 – 2016-01-22 (×4): 1 [drp] via OPHTHALMIC
  Filled 2016-01-20: qty 5

## 2016-01-20 MED ORDER — LORATADINE 10 MG PO TABS
10.0000 mg | ORAL_TABLET | Freq: Every day | ORAL | Status: DC
  Administered 2016-01-21 – 2016-01-22 (×2): 10 mg via ORAL
  Filled 2016-01-20 (×2): qty 1

## 2016-01-20 MED ORDER — MOMETASONE FURO-FORMOTEROL FUM 200-5 MCG/ACT IN AERO
2.0000 | INHALATION_SPRAY | Freq: Two times a day (BID) | RESPIRATORY_TRACT | Status: DC
  Administered 2016-01-20 – 2016-01-22 (×4): 2 via RESPIRATORY_TRACT
  Filled 2016-01-20: qty 8.8

## 2016-01-20 MED ORDER — SODIUM CHLORIDE 0.9% FLUSH
3.0000 mL | Freq: Two times a day (BID) | INTRAVENOUS | Status: DC
  Administered 2016-01-20: 3 mL via INTRAVENOUS

## 2016-01-20 MED ORDER — IPRATROPIUM-ALBUTEROL 20-100 MCG/ACT IN AERS: 1.0000 | INHALATION_SPRAY | Freq: Four times a day (QID) | RESPIRATORY_TRACT | Status: DC

## 2016-01-20 MED ORDER — METHYLPREDNISOLONE SODIUM SUCC 125 MG IJ SOLR
125.0000 mg | Freq: Once | INTRAMUSCULAR | Status: AC
  Administered 2016-01-20: 125 mg via INTRAVENOUS
  Filled 2016-01-20: qty 2

## 2016-01-20 MED ORDER — OXYCODONE-ACETAMINOPHEN 5-325 MG PO TABS: 1.0000 | ORAL_TABLET | Freq: Four times a day (QID) | ORAL | Status: DC | PRN

## 2016-01-20 MED ORDER — INSULIN ASPART 100 UNIT/ML ~~LOC~~ SOLN
0.0000 [IU] | Freq: Every day | SUBCUTANEOUS | Status: DC
  Administered 2016-01-20: 2 [IU] via SUBCUTANEOUS
  Filled 2016-01-20: qty 2

## 2016-01-20 MED ORDER — SODIUM CHLORIDE 0.9% FLUSH: 3.0000 mL | INTRAVENOUS | Status: DC | PRN

## 2016-01-20 MED ORDER — EPINEPHRINE 0.3 MG/0.3ML IJ SOAJ
0.3000 mg | Freq: Once | INTRAMUSCULAR | Status: DC | PRN
  Filled 2016-01-20: qty 0.3

## 2016-01-20 MED ORDER — MONTELUKAST SODIUM 10 MG PO TABS
10.0000 mg | ORAL_TABLET | Freq: Every day | ORAL | Status: DC
Start: 2016-01-20 — End: 2016-01-22
  Administered 2016-01-20 – 2016-01-21 (×2): 10 mg via ORAL
  Filled 2016-01-20 (×2): qty 1

## 2016-01-20 MED ORDER — TIOTROPIUM BROMIDE MONOHYDRATE 18 MCG IN CAPS
18.0000 ug | ORAL_CAPSULE | Freq: Every morning | RESPIRATORY_TRACT | Status: DC
  Administered 2016-01-20 – 2016-01-22 (×3): 18 ug via RESPIRATORY_TRACT
  Filled 2016-01-20: qty 5

## 2016-01-20 MED ORDER — ACETAMINOPHEN 650 MG RE SUPP: 650.0000 mg | Freq: Four times a day (QID) | RECTAL | Status: DC | PRN

## 2016-01-20 MED ORDER — ONDANSETRON HCL 4 MG/2ML IJ SOLN: 4.0000 mg | Freq: Four times a day (QID) | INTRAMUSCULAR | Status: DC | PRN

## 2016-01-20 MED ORDER — GUAIFENESIN 100 MG/5ML PO SOLN
5.0000 mL | Freq: Four times a day (QID) | ORAL | Status: DC | PRN
  Filled 2016-01-20: qty 5

## 2016-01-20 MED ORDER — VALACYCLOVIR HCL 500 MG PO TABS: 500.0000 mg | ORAL_TABLET | Freq: Two times a day (BID) | ORAL | Status: DC | PRN

## 2016-01-20 MED ORDER — OXYCODONE-ACETAMINOPHEN 5-325 MG PO TABS
1.0000 | ORAL_TABLET | Freq: Four times a day (QID) | ORAL | Status: DC | PRN
  Administered 2016-01-20 – 2016-01-21 (×3): 2 via ORAL
  Filled 2016-01-20 (×3): qty 2

## 2016-01-20 MED ORDER — INFLUENZA VAC SPLIT QUAD 0.5 ML IM SUSY
0.5000 mL | PREFILLED_SYRINGE | INTRAMUSCULAR | Status: AC
  Administered 2016-01-21: 0.5 mL via INTRAMUSCULAR
  Filled 2016-01-20: qty 0.5

## 2016-01-20 MED ORDER — ACETAMINOPHEN 325 MG PO TABS
650.0000 mg | ORAL_TABLET | Freq: Four times a day (QID) | ORAL | Status: DC | PRN
  Administered 2016-01-20: 650 mg via ORAL
  Filled 2016-01-20: qty 2

## 2016-01-20 MED ORDER — FLUTICASONE PROPIONATE 50 MCG/ACT NA SUSP
2.0000 | Freq: Every day | NASAL | Status: DC
  Administered 2016-01-20 – 2016-01-22 (×3): 2 via NASAL
  Filled 2016-01-20: qty 16

## 2016-01-20 MED ORDER — LEVOFLOXACIN IN D5W 750 MG/150ML IV SOLN
750.0000 mg | Freq: Every morning | INTRAVENOUS | Status: DC
  Administered 2016-01-20: 750 mg via INTRAVENOUS
  Filled 2016-01-20 (×2): qty 150

## 2016-01-20 MED ORDER — SODIUM CHLORIDE 0.9 % IV SOLN: 250.0000 mL | INTRAVENOUS | Status: DC | PRN

## 2016-01-20 MED ORDER — ONDANSETRON 4 MG PO TBDP
4.0000 mg | ORAL_TABLET | Freq: Three times a day (TID) | ORAL | Status: DC | PRN
Start: 2016-01-20 — End: 2016-01-20

## 2016-01-20 MED ORDER — IPRATROPIUM-ALBUTEROL 0.5-2.5 (3) MG/3ML IN SOLN: 3.0000 mL | RESPIRATORY_TRACT | Status: DC | PRN

## 2016-01-20 MED ORDER — IPRATROPIUM-ALBUTEROL 0.5-2.5 (3) MG/3ML IN SOLN
3.0000 mL | RESPIRATORY_TRACT | Status: DC
  Administered 2016-01-20 – 2016-01-22 (×13): 3 mL via RESPIRATORY_TRACT
  Filled 2016-01-20 (×13): qty 3

## 2016-01-20 NOTE — ED Triage Notes (Addendum)
Pt presents from home with SOB since Thursday. Reports hx of asthma and similar episodes in the past. Was seen at PCP on Friday and given a Solumedrol shot with little relief. States she did a breathing treatment at home prior to arrival with no relief. Patient lung sounds diminished throughout with increased work of breathing.

## 2016-01-20 NOTE — ED Notes (Addendum)
Pt presents with asthma exacerbation since Thursday. She was seen by pcp on Friday, prescribed solumedrol, has had no relief with inhaler and nebs over the weekend. Pt is working hard to breathe, using accessory muscles. O2 sat at 100% on 2L. Pt alert & oriented, diaphoretic, wheezes audible without auscultation.

## 2016-01-20 NOTE — H&P (Signed)
[place] [facility] [place] NAME: Summer Murphy    MR#:  [id number]  DATE OF BIRTH:  08-Jul-1955  DATE OF ADMISSION:  01/20/2016  PRIMARY CARE PHYSICIAN: Lavera Guise, MD   REQUESTING/REFERRING PHYSICIAN: Dr. Corky Downs  CHIEF COMPLAINT:    Shortness of breath since Thursday HISTORY OF PRESENT ILLNESS:  Summer Murphy  is a 60 y.o. female with a known history of Asthma on rescue inhalers who presented to her primary care edition's office last week due to shortness of breath and wheezing. She was given 1 dose of IV steroids and was going to follow-up today however her symptoms worsened and so she presents to the emergency room shortness of breath, cough and wheezing. She is found to have asthma exacerbation. She is provided with IV steroids and DuoNeb's in the emergency room with some relief.  PAST MEDICAL HISTORY:   Past Medical History:  Diagnosis Date  . Acid reflux   . Anxiety   . Arrhythmia    treated with meds and has no current problems  . Arthritis    most uncomfortable in knees  . Asthma    uses several inhalers  . Depression   . Diabetes mellitus without complication (HCC)    sugars run low, not high sugars  . Fever blister   . Hematuria   . Hypertension   . Kidney stone    stones, 2nd lithotripsy  . Left flank pain   . Migraine   . Restless leg   . Yeast vaginitis     PAST SURGICAL HISTORY:   Past Surgical History:  Procedure Laterality Date  . APPENDECTOMY  1990  . CARDIAC ELECTROPHYSIOLOGY STUDY AND ABLATION    . CHOLECYSTECTOMY  1990  . EXTRACORPOREAL SHOCK WAVE LITHOTRIPSY Left 09/12/2015   Procedure: EXTRACORPOREAL SHOCK WAVE LITHOTRIPSY (ESWL);  Surgeon: Hollice Espy, MD;  Location: [facility] ORS;  Service: Urology;  Laterality: Left;  . HH repair    . JOINT REPLACEMENT Bilateral 2013,2014   total knees  . LAPAROSCOPIC HYSTERECTOMY    . LITHOTRIPSY    . URETEROSCOPY      SOCIAL HISTORY:   Social History   Substance Use Topics  . Smoking status: Never Smoker  . Smokeless tobacco: No  . Alcohol use No    FAMILY HISTORY:   Family History  Problem Relation Age of Onset  . Prostate cancer Father   . Kidney Stones Father   . Kidney disease Neg Hx     DRUG ALLERGIES:   Allergies  Allergen Reactions  . Librium [Chlordiazepoxide] Shortness Of Breath  . Nsaids     Other reaction(s): Other (See Comments) Other Reaction: Allergy  . Vanilla Shortness Of Breath  . Aspirin Hives  . Azithromycin     Other reaction(s): Unknown  . Buprenorphine Hcl   . Chlordiazepoxide Hcl     Other reaction(s): OTHER  . Iron     Other reaction(s): Unknown  . Morphine And Related   . Reglan [Metoclopramide] Hives  . Sulfa Antibiotics     Other reaction(s): Unknown  . Tolmetin     Other reaction(s): Other (See Comments) Other Reaction: Allergy    REVIEW OF SYSTEMS:   ROS  MEDICATIONS AT HOME:   Prior to Admission medications   Medication Sig Start Date End Date Taking? Authorizing Provider  budesonide-formoterol (SYMBICORT) 160-4.5 MCG/ACT inhaler Inhale 2 puffs into the lungs 2 (two) times daily.   Yes Historical Provider, MD  butalbital-acetaminophen-caffeine (FIORICET, ESGIC)  50-325-40 MG tablet Take 1 tablet by mouth every 4 (four) hours as needed.  02/23/14  Yes Historical Provider, MD  cetirizine (ZYRTEC) 10 MG tablet Take 10 mg by mouth daily.   Yes Historical Provider, MD  cyclobenzaprine (FLEXERIL) 10 MG tablet Take 1 tablet (10 mg total) by mouth every 8 (eight) hours as needed for muscle spasms. Patient taking differently: Take 10 mg by mouth at bedtime.  06/18/15 06/17/16 Yes [name], MD  diltiazem (DILACOR XR) 180 MG 24 hr capsule Take 180 mg by mouth daily.   Yes Historical Provider, MD  diphenhydrAMINE (BENADRYL) 25 MG tablet Take 25 mg by mouth every 6 (six) hours as needed.   Yes Historical Provider, MD  EPINEPHrine 0.3 mg/0.3 mL IJ SOAJ injection Inject 0.3 mg into the  muscle once.   Yes Historical Provider, MD  estrogens, conjugated, (PREMARIN) 0.45 MG tablet Take 0.45 mg by mouth every evening. Reported on 09/09/2015   Yes Historical Provider, MD  fluticasone (FLONASE) 50 MCG/ACT nasal spray Place 2 sprays into both nostrils daily.   Yes Historical Provider, MD  gabapentin (NEURONTIN) 400 MG capsule Take 400 mg by mouth 2 (two) times daily.   Yes Historical Provider, MD  Ipratropium-Albuterol (COMBIVENT) 20-100 MCG/ACT AERS respimat Inhale 1 puff into the lungs 4 (four) times daily.   Yes Historical Provider, MD  ipratropium-albuterol (DUONEB) 0.5-2.5 (3) MG/3ML SOLN Take 3 mLs by nebulization every 4 (four) hours as needed (for shortness of breath/wheezing).    Yes Historical Provider, MD  montelukast (SINGULAIR) 10 MG tablet Take 10 mg by mouth at bedtime.   Yes Historical Provider, MD  Olopatadine HCl 0.2 % SOLN Place 1 drop into both eyes at bedtime.   Yes Historical Provider, MD  omeprazole (PRILOSEC) 20 MG capsule Take 20 mg by mouth 2 (two) times daily.   Yes Historical Provider, MD  ondansetron (ZOFRAN ODT) 4 MG disintegrating tablet Take 1 tablet (4 mg total) by mouth every 8 (eight) hours as needed for nausea or vomiting. 09/02/15  Yes Carrie Mew, MD  oxyCODONE (OXY IR/ROXICODONE) 5 MG immediate release tablet Reported on 09/09/2015 04/12/14  Yes Historical Provider, MD  oxyCODONE-acetaminophen (PERCOCET) 5-325 MG tablet Take 1 tablet by mouth every 6 (six) hours as needed for severe pain. 09/26/15  Yes Shannon A McGowan, PA-C  rOPINIRole (REQUIP) 0.25 MG tablet Take 0.25 mg by mouth 2 (two) times daily.   Yes Historical Provider, MD  tiotropium (SPIRIVA) 18 MCG inhalation capsule Place 18 mcg into inhaler and inhale daily.   Yes Historical Provider, MD  traMADol (ULTRAM) 50 MG tablet Take 50-100 mg by mouth every 6 (six) hours as needed for moderate pain or severe pain. Reported on 09/12/2015   Yes Historical Provider, MD  traZODone (DESYREL) 100 MG  tablet Take 100 mg by mouth at bedtime. Reported on 09/12/2015   Yes Historical Provider, MD  valACYclovir (VALTREX) 500 MG tablet Take 500 mg by mouth 2 (two) times daily as needed (for fever blisters).    Yes Historical Provider, MD  diclofenac sodium (VOLTAREN) 1 % GEL Apply 2 g topically 2 (two) times daily as needed (for pain).     Historical Provider, MD      VITAL SIGNS:  Blood pressure 136/79, pulse 94, temperature 97.7 F (36.5 C), temperature source Oral, resp. rate 15, height 5\' 3"  (1.6 m), weight 77.1 kg (170 lb), SpO2 100 %.  PHYSICAL EXAMINATION:   Physical Exam    LABORATORY PANEL:  CBC  Recent Labs Lab 01/20/16 0719  WBC 9.7  HGB 14.4  HCT 42.1  PLT 288   ------------------------------------------------------------------------------------------------------------------  Chemistries   Recent Labs Lab 01/20/16 0719  NA 138  K 4.2  CL 102  CO2 27  GLUCOSE 117*  BUN 13  CREATININE 0.75  CALCIUM 9.5  AST 21  ALT 17  ALKPHOS 103  BILITOT 0.3   ------------------------------------------------------------------------------------------------------------------  Cardiac Enzymes  Recent Labs Lab 01/20/16 0719  TROPONINI <0.03   ------------------------------------------------------------------------------------------------------------------  RADIOLOGY:  Dg Chest Portable 1 View  Result Date: 01/20/2016 CLINICAL DATA:  60 year old female with shortness of breath. History of asthma. Initial encounter. EXAM: PORTABLE CHEST 1 VIEW COMPARISON:  08/28/2014 chest x-ray. FINDINGS: Minimal chronic lung changes without evidence of infiltrate, congestive heart failure or pneumothorax. Small to slightly moderate-size hiatal hernia. Heart slightly enlarged which may be related to portable technique. No plain film evidence of pulmonary malignancy. IMPRESSION: No acute pulmonary abnormality noted. Small to slightly moderate-size hiatal hernia. Electronically Signed    By: Genia Del M.D.   On: 01/20/2016 08:39    EKG:   Normal sinus rhythm no ST elevation there is a left bundle branch block which is unchanged from previous  IMPRESSION AND PLAN:   60 year old female with a history of asthma who presents with acute hypoxic respiratory failure in the setting of asthma exacerbation.  1. Acute hypoxic respiratory failure in the setting as exacerbation: Patient will continue with oxygen and wean as solid. Plan continue IV steroids and inhalers. Add duo nebs and Levaquin.  2. Essential hypertension: Continue diltiazem   3. Diet-controlled diabetes: Add sliding scale insulin 4. GERD: Continue PPI  5. Restless leg syndrome: Continue Requip  All the records are reviewed and case discussed with ED provider. Management plans discussed with the patient and she in agreement  CODE STATUS: full  TOTAL TIME TAKING CARE OF THIS PATIENT: 45 minutes.    Matheau Orona M.D on 01/20/2016 at 12:14 PM  Between 7am to 6pm - Pager - [phone]  After 6pm go to [email or web address] - password [email or web address] [facility]  Office  [phone]  CC: Primary care physician; Lavera Guise, MD

## 2016-01-20 NOTE — Progress Notes (Signed)
Summer Murphy responded to an order from a nurse to visit with this patient who requested for prayers. Summer Murphy visited the Pt. The Pt was with some members from a church she visited once but had not committed herself to attend. Pt asked Summer Murphy to offer prayers for healing, which the [facility] provided.    01/20/16 1600  Clinical Encounter Type  Visited With Patient  Visit Type Initial;Spiritual support  Referral From Nurse  Consult/Referral To Chaplain  Spiritual Encounters  Spiritual Needs Prayer

## 2016-01-20 NOTE — ED Provider Notes (Signed)
[facility] Emergency Department Provider Note   ____________________________________________    I have reviewed the triage vital signs and the nursing notes.   HISTORY  Chief Complaint Shortness of Breath     HPI Summer Murphy is a 60 y.o. female who reports a history of asthma presents with shortness of breath. She has had worsening symptoms over the last several days. She had a shot of Solu-Medrol by her PCP 2 days ago but reports symptoms have continued to worsen. She denies fevers or chills. She denies chest pain but does have some chest tightness. No recent travel, calf pain or swelling. She reports she has been admitted in the past for shortness of breath   Past Medical History:  Diagnosis Date  . Acid reflux   . Anxiety   . Arrhythmia    treated with meds and has no current problems  . Arthritis    most uncomfortable in knees  . Asthma    uses several inhalers  . Depression   . Diabetes mellitus without complication (HCC)    sugars run low, not high sugars  . Fever blister   . Hematuria   . Hypertension   . Kidney stone    stones, 2nd lithotripsy  . Left flank pain   . Migraine   . Restless leg   . Yeast vaginitis     Patient Active Problem List   Diagnosis Date Noted  . Severe recurrent major depression without psychotic features ([place]) 09/10/2014  . COPD (chronic obstructive pulmonary disease) ([place]) 09/10/2014  . Calculi, ureter 04/26/2013  . Corneal graft malfunction 08/17/2012  . Calculus of kidney 04/26/2012  . Renal colic [id number]  . Urge incontinence 04/26/2012  . Diaphragmatic hernia 10/27/2010  . Barrett esophagus 07/07/2010    Past Surgical History:  Procedure Laterality Date  . APPENDECTOMY  1990  . CARDIAC ELECTROPHYSIOLOGY STUDY AND ABLATION    . CHOLECYSTECTOMY  1990  . EXTRACORPOREAL SHOCK WAVE LITHOTRIPSY Left 09/12/2015   Procedure: EXTRACORPOREAL SHOCK WAVE LITHOTRIPSY (ESWL);  Surgeon: Hollice Espy, MD;  Location: [facility] ORS;  Service: Urology;  Laterality: Left;  . HH repair    . JOINT REPLACEMENT Bilateral 2013,2014   total knees  . LAPAROSCOPIC HYSTERECTOMY    . LITHOTRIPSY    . URETEROSCOPY      Prior to Admission medications   Medication Sig Start Date End Date Taking? Authorizing Provider  amitriptyline (ELAVIL) 50 MG tablet Take 50 mg by mouth at bedtime.    Historical Provider, MD  azelastine (ASTELIN) 0.1 % nasal spray Place 1 spray into both nostrils at bedtime.    Historical Provider, MD  Brexpiprazole (REXULTI) 4 MG TABS Take 4 mg by mouth at bedtime.    Historical Provider, MD  budesonide-formoterol (SYMBICORT) 160-4.5 MCG/ACT inhaler Inhale 2 puffs into the lungs 2 (two) times daily.    Historical Provider, MD  butalbital-acetaminophen-caffeine (FIORICET, ESGIC) [phone] MG tablet  02/23/14   Historical Provider, MD  cetirizine (ZYRTEC) 10 MG tablet Take 10 mg by mouth daily.    Historical Provider, MD  Cholecalciferol (VITAMIN D-1000 MAX ST) 1000 units tablet Take by mouth.    Historical Provider, MD  ciprofloxacin (CIPRO) 500 MG tablet Take 500 mg by mouth 2 (two) times daily.    Historical Provider, MD  citalopram (CELEXA) 10 MG tablet Take 10 mg by mouth daily.    Historical Provider, MD  cyclobenzaprine (FLEXERIL) 10 MG tablet Take 1 tablet (10 mg total) by  mouth every 8 (eight) hours as needed for muscle spasms. 06/18/15 06/17/16  [name], MD  diclofenac sodium (VOLTAREN) 1 % GEL Apply 2 g topically 2 (two) times daily as needed (for pain).     Historical Provider, MD  diltiazem (DILACOR XR) 180 MG 24 hr capsule Take 180 mg by mouth daily.    Historical Provider, MD  diphenhydrAMINE (BENADRYL) 25 MG tablet Take 25 mg by mouth every 6 (six) hours as needed.    Historical Provider, MD  DULoxetine (CYMBALTA) 30 MG capsule Take 30-60 mg by mouth 2 (two) times daily. 30 mg every morning and 60 mg at bedtime    Historical Provider, MD  EPINEPHrine 0.3  mg/0.3 mL IJ SOAJ injection Inject 0.3 mg into the muscle once.    Historical Provider, MD  estrogens, conjugated, (PREMARIN) 0.45 MG tablet Take 0.45 mg by mouth every evening. Reported on 09/09/2015    Historical Provider, MD  fluticasone (FLONASE) 50 MCG/ACT nasal spray Place 2 sprays into both nostrils daily.    Historical Provider, MD  gabapentin (NEURONTIN) 400 MG capsule Take 400 mg by mouth 2 (two) times daily.    Historical Provider, MD  Ipratropium-Albuterol (COMBIVENT) 20-100 MCG/ACT AERS respimat Inhale 1 puff into the lungs 4 (four) times daily.    Historical Provider, MD  ipratropium-albuterol (DUONEB) 0.5-2.5 (3) MG/3ML SOLN Take 3 mLs by nebulization every 4 (four) hours as needed (for shortness of breath/wheezing).     Historical Provider, MD  montelukast (SINGULAIR) 10 MG tablet Take 10 mg by mouth at bedtime.    Historical Provider, MD  Multiple Vitamin (MULTIVITAMIN WITH MINERALS) TABS tablet Take 1 tablet by mouth daily.    Historical Provider, MD  Olopatadine HCl 0.2 % SOLN Place 1 drop into both eyes at bedtime.    Historical Provider, MD  omeprazole (PRILOSEC) 20 MG capsule Take 20 mg by mouth 2 (two) times daily.    Historical Provider, MD  ondansetron (ZOFRAN ODT) 4 MG disintegrating tablet Take 1 tablet (4 mg total) by mouth every 8 (eight) hours as needed for nausea or vomiting. 09/02/15   Carrie Mew, MD  oxyCODONE (OXY IR/ROXICODONE) 5 MG immediate release tablet Reported on 09/09/2015 04/12/14   Historical Provider, MD  oxyCODONE-acetaminophen (PERCOCET) 5-325 MG tablet Take 1 tablet by mouth every 6 (six) hours as needed for severe pain. 09/26/15   Larene Beach A McGowan, PA-C  rOPINIRole (REQUIP) 0.25 MG tablet Take 0.25 mg by mouth 2 (two) times daily.    Historical Provider, MD  tamsulosin (FLOMAX) 0.4 MG CAPS capsule Take 1 capsule (0.4 mg total) by mouth daily. 09/26/15   Larene Beach A McGowan, PA-C  tiotropium (SPIRIVA) 18 MCG inhalation capsule Place 18 mcg into inhaler and  inhale daily.    Historical Provider, MD  topiramate (TOPAMAX) 50 MG tablet Take 50 mg by mouth at bedtime. Reported on 09/12/2015    Historical Provider, MD  traMADol (ULTRAM) 50 MG tablet Take 50-100 mg by mouth every 6 (six) hours as needed for moderate pain or severe pain. Reported on 09/12/2015    Historical Provider, MD  traZODone (DESYREL) 100 MG tablet Take 100 mg by mouth at bedtime. Reported on 09/12/2015    Historical Provider, MD  valACYclovir (VALTREX) 500 MG tablet Take 500 mg by mouth 2 (two) times daily as needed (for fever blisters).     Historical Provider, MD     Allergies Librium [chlordiazepoxide]; Nsaids; Vanilla; Aspirin; Azithromycin; Buprenorphine hcl; Chlordiazepoxide hcl; Iron; Morphine and related;  Reglan [metoclopramide]; Sulfa antibiotics; and Tolmetin  Family History  Problem Relation Age of Onset  . Prostate cancer Father   . Kidney Stones Father   . Kidney disease Neg Hx     Social History Social History  Substance Use Topics  . Smoking status: Never Smoker  . Smokeless tobacco: Not on file  . Alcohol use No    Review of Systems  Constitutional: No fever/chills Eyes: No visual changes.  ENT: No Throat swelling Cardiovascular: As above. Respiratory: As above Gastrointestinal: No abdominal pain.  .    Musculoskeletal: Negative for back pain. Skin: Negative for rash. Neurological: Negative for weakness  10-point ROS otherwise negative.  ____________________________________________   PHYSICAL EXAM:  VITAL SIGNS: ED Triage Vitals  Enc Vitals Group     BP --      Pulse Rate 01/20/16 0701 64     Resp 01/20/16 0701 (!) 24     Temp 01/20/16 0701 97.7 F (36.5 C)     Temp Source 01/20/16 0701 Oral     SpO2 01/20/16 0701 (!) 85 %     Weight 01/20/16 0702 170 lb (77.1 kg)     Height 01/20/16 0702 5\' 3"  (1.6 m)     Head Circumference --      Peak Flow --      Pain Score 01/20/16 0702 8     Pain Loc --      Pain Edu? --      Excl. in [place]?  --     Constitutional: Alert and oriented. Pleasant and interactive Eyes: Conjunctivae are normal.   Nose: No congestion/rhinnorhea. Mouth/Throat: Mucous membranes are moist.  Normal pharynx, no stridor  Cardiovascular: Normal rate, regular rhythm. Grossly normal heart sounds.  Good peripheral circulation. Respiratory: Increased respiratory effort with tachypnea, positive retractions, scattered wheezes, poor airflow Gastrointestinal: Soft and nontender. No distention.   Genitourinary: deferred Musculoskeletal: No lower extremity tenderness nor edema.  Warm and well perfused Neurologic:  Normal speech and language. No gross focal neurologic deficits are appreciated.  Skin:  Skin is warm, dry and intact. No rash noted. Psychiatric: Mood and affect are normal. Speech and behavior are normal.  ____________________________________________   LABS (all labs ordered are listed, but only abnormal results are displayed)  Labs Reviewed  CBC WITH DIFFERENTIAL/PLATELET - Abnormal; Notable for the following:       Result Value   RDW 14.9 (*)    All other components within normal limits  COMPREHENSIVE METABOLIC PANEL - Abnormal; Notable for the following:    Glucose, Bld 117 (*)    All other components within normal limits  TROPONIN I  BRAIN NATRIURETIC PEPTIDE   ____________________________________________  EKG  ED ECG REPORT I, Lavonia Drafts, the attending physician, personally viewed and interpreted this ECG.  Date: 01/20/2016 EKG Time: 7:17 AM Rate:  98 Rhythm: normal sinus rhythm QRS Axis: normal Intervals: normal ST/T Wave abnormalities: normal Conduction Disturbances: Left bundle branch block, unchanged from prior   ____________________________________________  RADIOLOGY  Chest x-ray unremarkable ____________________________________________   PROCEDURES  Procedure(s) performed: No    Critical Care performed:  No ____________________________________________   INITIAL IMPRESSION / ASSESSMENT AND PLAN / ED COURSE  Pertinent labs & imaging results that were available during my care of the patient were reviewed by me and considered in my medical decision making (see chart for details).  Patient presents with shortness of breath, she increases the asthma although she has diagnosis of COPD on her chart. She denies smoking history.  We'll treat with slight Medrol IV, DuoNeb, check labs x-ray and reevaluate. Anticipate admission given significant hypoxia on arrival  Clinical Course  ----------------------------------------- 9:20 AM on 01/20/2016 -----------------------------------------  Patient reports feeling somewhat better but still working to breathe, still relatively poor airflow, will admit for further management ____________________________________________   FINAL CLINICAL IMPRESSION(S) / ED DIAGNOSES  Final diagnoses:  COPD exacerbation ([place])      NEW MEDICATIONS STARTED DURING THIS VISIT:  New Prescriptions   No medications on file     Note:  This document was prepared using Dragon voice recognition software and may include unintentional dictation errors.    Lavonia Drafts, MD 01/20/16 [phone]

## 2016-01-21 LAB — HEMOGLOBIN A1C
Hgb A1c MFr Bld: 6.1 % — ABNORMAL HIGH (ref 4.8–5.6)
Mean Plasma Glucose: 128 mg/dL

## 2016-01-21 LAB — GLUCOSE, CAPILLARY
Glucose-Capillary: 186 mg/dL — ABNORMAL HIGH (ref 65–99)
Glucose-Capillary: 256 mg/dL — ABNORMAL HIGH (ref 65–99)
Glucose-Capillary: 177 mg/dL — ABNORMAL HIGH (ref 65–99)
Glucose-Capillary: 140 mg/dL — ABNORMAL HIGH (ref 65–99)

## 2016-01-21 LAB — BASIC METABOLIC PANEL
Anion gap: 11 (ref 5–15)
BUN: 18 mg/dL (ref 6–20)
CO2: 22 mmol/L (ref 22–32)
Calcium: 9.1 mg/dL (ref 8.9–10.3)
Chloride: 102 mmol/L (ref 101–111)
Creatinine, Ser: 0.72 mg/dL (ref 0.44–1.00)
GFR calc Af Amer: >60 mL/min (ref >60)
GFR calc non Af Amer: >60 mL/min (ref >60)
Glucose, Bld: 191 mg/dL — ABNORMAL HIGH (ref 65–99)
Potassium: 4.1 mmol/L (ref 3.5–5.1)
Sodium: 135 mmol/L (ref 135–145)

## 2016-01-21 LAB — CBC
HCT: 40.5 % (ref 35.0–47.0)
Hemoglobin: 13.3 g/dL (ref 12.0–16.0)
MCH: 29.9 pg (ref 26.0–34.0)
MCHC: 32.8 g/dL (ref 32.0–36.0)
MCV: 91.1 fL (ref 80.0–100.0)
Platelets: 280 10*3/uL (ref 150–440)
RBC: 4.45 MIL/uL (ref 3.80–5.20)
RDW: 14.5 % (ref 11.5–14.5)
WBC: 17 10*3/uL — ABNORMAL HIGH (ref 3.6–11.0)

## 2016-01-21 MED ORDER — TRAMADOL HCL 50 MG PO TABS: 50.0000 mg | ORAL_TABLET | Freq: Four times a day (QID) | ORAL | Status: DC | PRN

## 2016-01-21 MED ORDER — DIPHENHYDRAMINE HCL 25 MG PO CAPS
50.0000 mg | ORAL_CAPSULE | Freq: Four times a day (QID) | ORAL | Status: DC | PRN
  Administered 2016-01-21: 50 mg via ORAL
  Filled 2016-01-21: qty 2

## 2016-01-21 MED ORDER — ZOLPIDEM TARTRATE 5 MG PO TABS
5.0000 mg | ORAL_TABLET | Freq: Every evening | ORAL | Status: DC | PRN
  Filled 2016-01-21: qty 1

## 2016-01-21 MED ORDER — LEVOFLOXACIN 750 MG PO TABS
750.0000 mg | ORAL_TABLET | Freq: Every day | ORAL | Status: DC
  Administered 2016-01-21 – 2016-01-22 (×2): 750 mg via ORAL
  Filled 2016-01-21 (×2): qty 1

## 2016-01-21 MED ORDER — OXYCODONE-ACETAMINOPHEN 5-325 MG PO TABS
1.0000 | ORAL_TABLET | Freq: Four times a day (QID) | ORAL | Status: DC | PRN
  Administered 2016-01-21: 2 via ORAL
  Filled 2016-01-21: qty 2

## 2016-01-21 MED ORDER — METHYLPREDNISOLONE SODIUM SUCC 40 MG IJ SOLR
40.0000 mg | Freq: Three times a day (TID) | INTRAMUSCULAR | Status: DC
  Administered 2016-01-21 – 2016-01-22 (×3): 40 mg via INTRAVENOUS
  Filled 2016-01-21 (×3): qty 1

## 2016-01-21 NOTE — Evaluation (Signed)
Physical Therapy Evaluation Patient Details Name: Summer Murphy MRN: [id number] DOB: 06-26-1955 Today's Date: 01/21/2016   History of Present Illness  60 y/o female who came to ER with SOB/chest tightness, admitted with athsama exacerbation.   Clinical Impression  Pt did well with all PT related strength, balance, gait activities.  She did have resting HR in the 110s that increased to 130s with ~350 ft of ambulation but her O2 remained >92% on room air and she did not have any balance or safety concerns with the effort.  She reports to not be walking quite as fast or confidently as her normal, but feels good about being able to go home once cardiovascular issues are resolved.  Pt with no safety concerns or PT related limitations.  Pt does not require further PT intervention.    Follow Up Recommendations No PT follow up    Equipment Recommendations       Recommendations for Other Services       Precautions / Restrictions Precautions Precautions: None Restrictions Weight Bearing Restrictions: No      Mobility  Bed Mobility Overal bed mobility: Independent                Transfers Overall transfer level: Independent Equipment used: None             General transfer comment: Pt able to rise with good confidence and safety  Ambulation/Gait Ambulation/Gait assistance: Modified independent (Device/Increase time) Ambulation Distance (Feet): 350 Feet Assistive device: None       General Gait Details: Pt with consistent and confident gait (community appropriate speed, though she reports that normally she is much faster).  Pt's O2 remained >92% the entire time on room air, but her HR did climb from 110s to 130s with prolonged ambulation.  Pt did have some fatigue.  Stairs            Wheelchair Mobility    Modified Rankin (Stroke Patients Only)       Balance Overall balance assessment: Independent                                            Pertinent Vitals/Pain Pain Assessment: No/denies pain    Home Living Family/patient expects to be discharged to:: Private residence Living Arrangements: Alone Available Help at Discharge: Friend(s) Type of Home: Apartment Home Access: Elevator (often does the 3 flights of steps for exercise)       Home Equipment: Walker - 2 wheels;Cane - single point (does not need/use)      Prior Function Level of Independence: Independent         Comments: Pt reports that she is generally quite active, running errands, out of the house daily     Hand Dominance        Extremity/Trunk Assessment   Upper Extremity Assessment: Overall WFL for tasks assessed           Lower Extremity Assessment: Overall WFL for tasks assessed         Communication   Communication: No difficulties  Cognition Arousal/Alertness: Awake/alert Behavior During Therapy: WFL for tasks assessed/performed Overall Cognitive Status: Within Functional Limits for tasks assessed                      General Comments      Exercises     Assessment/Plan  PT Assessment Patent does not need any further PT services  PT Problem List            PT Treatment Interventions      PT Goals (Current goals can be found in the Care Plan section)  Acute Rehab PT Goals Patient Stated Goal: go home    Frequency     Barriers to discharge        Co-evaluation               End of Session Equipment Utilized During Treatment: Gait belt Activity Tolerance: Patient tolerated treatment well (did have HR increase to 130s with prolonged ambulation) Patient left: in bed;with call bell/phone within reach           Time: 1121-1133 PT Time Calculation (min) (ACUTE ONLY): 12 min   Charges:   PT Evaluation $PT Eval Low Complexity: 1 Procedure     PT G CodesKreg Shropshire, DPT 01/21/2016, 12:07 PM

## 2016-01-21 NOTE — Progress Notes (Signed)
[facility] [place] NAME: Summer Murphy    MR#:  [id number]  DATE OF BIRTH:  Dec 02, 1955  SUBJECTIVE:   Patient symptoms have much improved although she still has some tightness in her chest.  REVIEW OF SYSTEMS:    Review of Systems  Constitutional: Negative.  Negative for chills, fever and malaise/fatigue.  HENT: Negative.  Negative for ear discharge, ear pain, hearing loss, nosebleeds and sore throat.   Eyes: Negative.  Negative for blurred vision and pain.  Respiratory: Positive for cough, shortness of breath and wheezing. Negative for hemoptysis.   Cardiovascular: Negative.  Negative for chest pain, palpitations and leg swelling.  Gastrointestinal: Negative.  Negative for abdominal pain, blood in stool, diarrhea, nausea and vomiting.  Genitourinary: Negative.  Negative for dysuria.  Musculoskeletal: Negative.  Negative for back pain.  Skin: Negative.   Neurological: Negative for dizziness, tremors, speech change, focal weakness, seizures and headaches.  Endo/Heme/Allergies: Negative.  Does not bruise/bleed easily.  Psychiatric/Behavioral: Negative.  Negative for depression, hallucinations and suicidal ideas.    Tolerating Diet:yes      DRUG ALLERGIES:   Allergies  Allergen Reactions  . Librium [Chlordiazepoxide] Shortness Of Breath  . Nsaids     Other reaction(s): Other (See Comments) Other Reaction: Allergy  . Vanilla Shortness Of Breath  . Aspirin Hives  . Azithromycin     Other reaction(s): Unknown  . Buprenorphine Hcl   . Chlordiazepoxide Hcl     Other reaction(s): OTHER  . Iron     Other reaction(s): Unknown  . Morphine And Related   . Reglan [Metoclopramide] Hives  . Sulfa Antibiotics     Other reaction(s): Unknown  . Tolmetin     Other reaction(s): Other (See Comments) Other Reaction: Allergy    VITALS:  Blood pressure 122/73, pulse (!) 105, temperature 98.5 F (36.9 C), temperature source Oral, resp.  rate 18, height 5\' 3"  (1.6 m), weight 77.1 kg (170 lb), SpO2 98 %.  PHYSICAL EXAMINATION:   Physical Exam    LABORATORY PANEL:   CBC  Recent Labs Lab 01/21/16 0421  WBC 17.0*  HGB 13.3  HCT 40.5  PLT 280   ------------------------------------------------------------------------------------------------------------------  Chemistries   Recent Labs Lab 01/20/16 0719 01/21/16 0421  NA 138 135  K 4.2 4.1  CL 102 102  CO2 27 22  GLUCOSE 117* 191*  BUN 13 18  CREATININE 0.75 0.72  CALCIUM 9.5 9.1  AST 21  --   ALT 17  --   ALKPHOS 103  --   BILITOT 0.3  --    ------------------------------------------------------------------------------------------------------------------  Cardiac Enzymes  Recent Labs Lab 01/20/16 0719  TROPONINI <0.03   ------------------------------------------------------------------------------------------------------------------  RADIOLOGY:  Dg Chest Portable 1 View  Result Date: 01/20/2016 CLINICAL DATA:  60 year old female with shortness of breath. History of asthma. Initial encounter. EXAM: PORTABLE CHEST 1 VIEW COMPARISON:  08/28/2014 chest x-ray. FINDINGS: Minimal chronic lung changes without evidence of infiltrate, congestive heart failure or pneumothorax. Small to slightly moderate-size hiatal hernia. Heart slightly enlarged which may be related to portable technique. No plain film evidence of pulmonary malignancy. IMPRESSION: No acute pulmonary abnormality noted. Small to slightly moderate-size hiatal hernia. Electronically Signed   By: Genia Del M.D.   On: 01/20/2016 08:39     ASSESSMENT AND PLAN:    60 year old female with a history of asthma who presents with acute hypoxic respiratory failure in the setting of asthma exacerbation.  1. Acute hypoxic respiratory failure in the  setting as exacerbation: Patient still needs IV steroids, DuoNeb's and inhalers. White blood cell count likely elevated due to steroids 2.  Essential hypertension: Continue diltiazem   3. Diet-controlled diabetes: Continue sliding scale insulin A1c is 6.1 4. GERD: Continue PPI  5. Restless leg syndrome: Continue Requip      Management plans discussed with the patient and she is in agreement.  CODE STATUS: full  TOTAL TIME TAKING CARE OF THIS PATIENT: 30 minutes.     POSSIBLE D/C tomorrow, DEPENDING ON CLINICAL CONDITION.   Tauheedah Bok M.D on 01/21/2016 at 10:31 AM  Between 7am to 6pm - Pager - [phone] After 6pm go to [email or web address] - password [email or web address] [facility]  Office  [phone]  CC: Primary care physician; Lavera Guise, MD  Note: This dictation was prepared with Dragon dictation along with smaller phrase technology. Any transcriptional errors that result from this process are unintentional.

## 2016-01-21 NOTE — Progress Notes (Signed)
CONCERNING: Antibiotic IV to Oral Route Change Policy  RECOMMENDATION: This patient is receiving levofloxacin by the intravenous route.  Based on criteria approved by the Pharmacy and Therapeutics Committee, the antibiotic(s) is/are being converted to the equivalent oral dose form(s).   DESCRIPTION: These criteria include:  Patient being treated for a respiratory tract infection, urinary tract infection, cellulitis or clostridium difficile associated diarrhea if on metronidazole  The patient is not neutropenic and does not exhibit a GI malabsorption state  The patient is eating (either orally or via tube) and/or has been taking other orally administered medications for a least 24 hours  The patient is improving clinically and has a Tmax < 100.5  If you have questions about this conversion, please contact the Pharmacy Department  []   [phone] )  [facility] []   [phone] )  [facility] []   [phone] )  [facility] []   [phone] )  [facility] []   [phone] )  [place], PharmD Clinical Pharmacist  01/21/2016 7:45 AM

## 2016-01-21 NOTE — Progress Notes (Signed)
Inpatient Diabetes Program Recommendations  AACE/ADA: New Consensus Statement on Inpatient Glycemic Control (2015)  Target Ranges:  Prepandial:   less than 140 mg/dL      Peak postprandial:   less than 180 mg/dL (1-2 hours)      Critically ill patients:  140 - 180 mg/dL   Lab Results  Component Value Date   GLUCAP 186 (H) 01/21/2016   HGBA1C 6.1 (H) 01/20/2016    Review of Glycemic Control  Results for Summer Murphy, Summer Murphy (MRN [id number]) as of 01/21/2016 08:34  Ref. Range 01/20/2016 16:28 01/20/2016 21:47 01/21/2016 07:49  Glucose-Capillary Latest Ref Range: 65 - 99 mg/dL 181 (H) 201 (H) 186 (H)    Diabetes history: Type 2 Outpatient Diabetes medications: none Current orders for Inpatient glycemic control: Novolog moderate correction tid, Novolog 0-5 units qhs * steroids 60mg  q8h  Inpatient Diabetes Program Recommendations:  Fasting CBG elevated today- if the patient is going to continue on steroids, consider low dose Lantus:   Lantus 8 units qhs (0.1unit/kg)  Gentry Fitz, RN, [place], [place], CDE Diabetes Coordinator Inpatient Diabetes Program  [phone] (Team Pager) [phone] ([place]) 01/21/2016 8:35 AM

## 2016-01-21 NOTE — Progress Notes (Signed)
[place] made a follow-up visit with the patient he had seen yesterday and found out how the Pt had informed Ch that she was concerned about her dog and had no one to take care of the dog. But somehow this problem was taken care of by one of her friend, and now she is very thankful that she doesn't have to worry about her dong while in hospital. Pt was also making good progress healthwise and requested prayer for fast recovery, which the chaplain provided.    01/21/16 1600  Clinical Encounter Type  Visited With Patient  Visit Type Follow-up;Spiritual support  Spiritual Encounters  Spiritual Needs Prayer;Other (Comment)  Stress Factors  Patient Stress Factors None identified  Family Stress Factors None identified

## 2016-01-22 DIAGNOSIS — Z23 Encounter for immunization: Secondary | ICD-10-CM | POA: Diagnosis not present

## 2016-01-22 LAB — GLUCOSE, CAPILLARY
Glucose-Capillary: 157 mg/dL — ABNORMAL HIGH (ref 65–99)
Glucose-Capillary: 180 mg/dL — ABNORMAL HIGH (ref 65–99)

## 2016-01-22 LAB — CBC
HCT: 37.2 % (ref 35.0–47.0)
Hemoglobin: 12.1 g/dL (ref 12.0–16.0)
MCH: 29.8 pg (ref 26.0–34.0)
MCHC: 32.5 g/dL (ref 32.0–36.0)
MCV: 91.6 fL (ref 80.0–100.0)
Platelets: 273 10*3/uL (ref 150–440)
RBC: 4.06 MIL/uL (ref 3.80–5.20)
RDW: 15 % — ABNORMAL HIGH (ref 11.5–14.5)
WBC: 23.6 10*3/uL — ABNORMAL HIGH (ref 3.6–11.0)

## 2016-01-22 MED ORDER — LEVOFLOXACIN 750 MG PO TABS: 750.0000 mg | ORAL_TABLET | Freq: Every day | ORAL | 0 refills | Status: DC

## 2016-01-22 MED ORDER — PREDNISONE 10 MG (21) PO TBPK: 10.0000 mg | ORAL_TABLET | Freq: Every day | ORAL | 0 refills | Status: DC

## 2016-01-22 MED ORDER — CYCLOBENZAPRINE HCL 10 MG PO TABS: 10.0000 mg | ORAL_TABLET | Freq: Every day | ORAL | 0 refills | Status: AC

## 2016-01-22 MED ORDER — MOMETASONE FURO-FORMOTEROL FUM 100-5 MCG/ACT IN AERO: 2.0000 | INHALATION_SPRAY | Freq: Two times a day (BID) | RESPIRATORY_TRACT | Status: DC

## 2016-01-22 NOTE — Discharge Summary (Signed)
[facility] [place] NAME: Summer Murphy    MR#:  [id number]  DATE OF BIRTH:  1956/01/06  DATE OF ADMISSION:  01/20/2016 ADMITTING PHYSICIAN: Bettey Costa, MD  DATE OF DISCHARGE: **01/22/2016  PRIMARY CARE PHYSICIAN: Lavera Guise, MD    ADMISSION DIAGNOSIS:  COPD exacerbation ([place]) [J44.1]  DISCHARGE DIAGNOSIS:  Active Problems:   Asthma   SECONDARY DIAGNOSIS:   Past Medical History:  Diagnosis Date  . Acid reflux   . Anxiety   . Arrhythmia    treated with meds and has no current problems  . Arthritis    most uncomfortable in knees  . Asthma    uses several inhalers  . Depression   . Diabetes mellitus without complication (HCC)    sugars run low, not high sugars  . Fever blister   . Hematuria   . Hypertension   . Kidney stone    stones, 2nd lithotripsy  . Left flank pain   . Migraine   . Restless leg   . Yeast vaginitis     HOSPITAL COURSE:   60 year old female with a history of asthma who presents with acute hypoxic respiratory failure in the setting of asthma exacerbation.  1. Acute hypoxic respiratory failure in the setting Of asthma exacerbation: Symptoms have improved with IV steroids. White blood cell is elevated due to steroids. Patient needs a repeat CBC in2- 3 days. She will be discharged with steroid taper and Levaquin for 2 more days.  2. Essential hypertension: Patient will continue on diltiazem.  3. Diet-controlled diabetes: Continue ADA diet at home A1c is 6.1 4. GERD: Continue PPI  5. Restless leg syndrome: Continue Requip   DISCHARGE CONDITIONS AND DIET:   Stable for discharge on regular diabetic diet  CONSULTS OBTAINED:    DRUG ALLERGIES:   Allergies  Allergen Reactions  . Librium [Chlordiazepoxide] Shortness Of Breath  . Nsaids     Other reaction(s): Other (See Comments) Other Reaction: Allergy  . Vanilla Shortness Of Breath  . Aspirin Hives  . Azithromycin     Other  reaction(s): Unknown  . Buprenorphine Hcl   . Chlordiazepoxide Hcl     Other reaction(s): OTHER  . Iron     Other reaction(s): Unknown  . Morphine And Related   . Reglan [Metoclopramide] Hives  . Sulfa Antibiotics     Other reaction(s): Unknown  . Tolmetin     Other reaction(s): Other (See Comments) Other Reaction: Allergy    DISCHARGE MEDICATIONS:   Current Discharge Medication List    START taking these medications   Details  levofloxacin (LEVAQUIN) 750 MG tablet Take 1 tablet (750 mg total) by mouth daily. Qty: 2 tablet, Refills: 0    predniSONE (STERAPRED UNI-PAK 21 TAB) 10 MG (21) TBPK tablet Take 1 tablet (10 mg total) by mouth daily. 60 mg PO (ORAL) x 2 days 50 mg PO (ORAL)  x 2 days 40 mg PO (ORAL)  x 2 days 30 mg PO  (ORAL)  x 2 days 20 mg PO  (ORAL) x 2 days 10 mg PO  (ORAL) x 2 days then stop Qty: 42 tablet, Refills: 0      CONTINUE these medications which have CHANGED   Details  cyclobenzaprine (FLEXERIL) 10 MG tablet Take 1 tablet (10 mg total) by mouth at bedtime. Qty: 30 tablet, Refills: 0      CONTINUE these medications which have NOT CHANGED   Details  budesonide-formoterol (SYMBICORT) 160-4.5 MCG/ACT  inhaler Inhale 2 puffs into the lungs 2 (two) times daily.    butalbital-acetaminophen-caffeine (FIORICET, ESGIC) 50-325-40 MG tablet Take 1 tablet by mouth every 4 (four) hours as needed.     cetirizine (ZYRTEC) 10 MG tablet Take 10 mg by mouth daily.    diltiazem (DILACOR XR) 180 MG 24 hr capsule Take 180 mg by mouth daily.    diphenhydrAMINE (BENADRYL) 25 MG tablet Take 25 mg by mouth every 6 (six) hours as needed.    EPINEPHrine 0.3 mg/0.3 mL IJ SOAJ injection Inject 0.3 mg into the muscle once.    estrogens, conjugated, (PREMARIN) 0.45 MG tablet Take 0.45 mg by mouth every evening. Reported on 09/09/2015    fluticasone (FLONASE) 50 MCG/ACT nasal spray Place 2 sprays into both nostrils daily.    gabapentin (NEURONTIN) 400 MG capsule Take 400  mg by mouth 2 (two) times daily.    Ipratropium-Albuterol (COMBIVENT) 20-100 MCG/ACT AERS respimat Inhale 1 puff into the lungs 4 (four) times daily.    ipratropium-albuterol (DUONEB) 0.5-2.5 (3) MG/3ML SOLN Take 3 mLs by nebulization every 4 (four) hours as needed (for shortness of breath/wheezing).     montelukast (SINGULAIR) 10 MG tablet Take 10 mg by mouth at bedtime.    Olopatadine HCl 0.2 % SOLN Place 1 drop into both eyes at bedtime.    omeprazole (PRILOSEC) 20 MG capsule Take 20 mg by mouth 2 (two) times daily.    ondansetron (ZOFRAN ODT) 4 MG disintegrating tablet Take 1 tablet (4 mg total) by mouth every 8 (eight) hours as needed for nausea or vomiting. Qty: 20 tablet, Refills: 0    oxyCODONE-acetaminophen (PERCOCET) 5-325 MG tablet Take 1 tablet by mouth every 6 (six) hours as needed for severe pain. Qty: 10 tablet, Refills: 0    rOPINIRole (REQUIP) 0.25 MG tablet Take 0.25 mg by mouth 2 (two) times daily.    tiotropium (SPIRIVA) 18 MCG inhalation capsule Place 18 mcg into inhaler and inhale daily.    traMADol (ULTRAM) 50 MG tablet Take 50-100 mg by mouth every 6 (six) hours as needed for moderate pain or severe pain. Reported on 09/12/2015    traZODone (DESYREL) 100 MG tablet Take 100 mg by mouth at bedtime. Reported on 09/12/2015    valACYclovir (VALTREX) 500 MG tablet Take 500 mg by mouth 2 (two) times daily as needed (for fever blisters).     diclofenac sodium (VOLTAREN) 1 % GEL Apply 2 g topically 2 (two) times daily as needed (for pain).       STOP taking these medications     oxyCODONE (OXY IR/ROXICODONE) 5 MG immediate release tablet               Today   CHIEF COMPLAINT:   Doing very well this morning. No shortness breath or wheezing   VITAL SIGNS:  Blood pressure 120/67, pulse 96, temperature 98.1 F (36.7 C), temperature source Oral, resp. rate 18, height 5\' 3"  (1.6 m), weight 77.1 kg (170 lb), SpO2 96 %.   REVIEW OF SYSTEMS:  Review of  Systems  Constitutional: Negative.  Negative for chills, fever and malaise/fatigue.  HENT: Negative.  Negative for ear discharge, ear pain, hearing loss, nosebleeds and sore throat.   Eyes: Negative.  Negative for blurred vision and pain.  Respiratory: Negative.  Negative for cough, hemoptysis, shortness of breath and wheezing.   Cardiovascular: Negative.  Negative for chest pain, palpitations and leg swelling.  Gastrointestinal: Negative.  Negative for abdominal pain, blood in stool, diarrhea,  nausea and vomiting.  Genitourinary: Negative.  Negative for dysuria.  Musculoskeletal: Negative.  Negative for back pain.  Skin: Negative.   Neurological: Negative for dizziness, tremors, speech change, focal weakness, seizures and headaches.  Endo/Heme/Allergies: Negative.  Does not bruise/bleed easily.  Psychiatric/Behavioral: Negative.  Negative for depression, hallucinations and suicidal ideas.     PHYSICAL EXAMINATION:  GENERAL:  60 y.o.-year-old patient lying in the bed with no acute distress.  NECK:  Supple, no jugular venous distention. No thyroid enlargement, no tenderness.  LUNGS: Normal breath sounds bilaterally, no wheezing, rales,rhonchi  No use of accessory muscles of respiration.  CARDIOVASCULAR: S1, S2 normal. No murmurs, rubs, or gallops.  ABDOMEN: Soft, non-tender, non-distended. Bowel sounds present. No organomegaly or mass.  EXTREMITIES: No pedal edema, cyanosis, or clubbing.  PSYCHIATRIC: The patient is alert and oriented x 3.  SKIN: No obvious rash, lesion, or ulcer.   DATA REVIEW:   CBC  Recent Labs Lab 01/22/16 0420  WBC 23.6*  HGB 12.1  HCT 37.2  PLT 273    Chemistries   Recent Labs Lab 01/20/16 0719 01/21/16 0421  NA 138 135  K 4.2 4.1  CL 102 102  CO2 27 22  GLUCOSE 117* 191*  BUN 13 18  CREATININE 0.75 0.72  CALCIUM 9.5 9.1  AST 21  --   ALT 17  --   ALKPHOS 103  --   BILITOT 0.3  --     Cardiac Enzymes  Recent Labs Lab 01/20/16 0719   TROPONINI <0.03    Microbiology Results  @[email or web address] @  RADIOLOGY:  No results found.    Management plans discussed with the patient and she is in agreement. Stable for discharge Home  Patient should follow up with pcp  CODE STATUS:     Code Status Orders        Start     Ordered   01/20/16 1109  Full code  Continuous     01/20/16 1108    Code Status History    Date Active Date Inactive Code Status Order ID Comments User Context   This patient has a current code status but no historical code status.      TOTAL TIME TAKING CARE OF THIS PATIENT: 35 minutes.    Note: This dictation was prepared with Dragon dictation along with smaller phrase technology. Any transcriptional errors that result from this process are unintentional.  Josue Kass M.D on 01/22/2016 at 9:06 AM  Between 7am to 6pm - Pager - [phone] After 6pm go to [email or web address] - password [email or web address] [facility]  Office  [phone]  CC: Primary care physician; Lavera Guise, MD

## 2016-01-22 NOTE — Progress Notes (Signed)
Patient was discharged home. IVs removed with cath intact. Reviewed meds and last dose taken. Along with new scripts. Allowed time for questions

## 2016-01-22 NOTE — Progress Notes (Signed)
[place] made a follow-up visit with the Pt whom she had seen earlier and provided spiritual support and prayer. Pt was doing very well. Pt told Ch, she missed her god and was preparing to go home today.  Pt thanked [facility] for providing spiritual support and prayers for her, and requested for more prayers. [place] provided spiritual support and prayers for the Pt.    01/22/16 1400  Clinical Encounter Type  Visited With Patient  Visit Type Follow-up;Spiritual support  Spiritual Encounters  Spiritual Needs Prayer;Other (Comment)  Stress Factors  Patient Stress Factors None identified;Other (Comment)  Family Stress Factors Other (Comment)

## 2016-01-22 NOTE — Care Management Important Message (Signed)
Important Message  Patient Details  Name: Summer Murphy MRN: [id number] Date of Birth: 1956/03/08   Medicare Important Message Given:  Yes    Jolly Mango, RN 01/22/2016, 9:41 AM

## 2016-01-27 ENCOUNTER — Emergency Department
Admission: EM | Admit: 2016-01-27 | Discharge: 2016-01-28 | Disposition: A | Discharge status: Home / Self Care | Payer: Medicare Other | Attending: Emergency Medicine | Admitting: Emergency Medicine

## 2016-01-27 ENCOUNTER — Emergency Department: Payer: Medicare Other

## 2016-01-27 DIAGNOSIS — F419 Anxiety disorder, unspecified: Secondary | ICD-10-CM

## 2016-01-27 DIAGNOSIS — I10 Essential (primary) hypertension: Secondary | ICD-10-CM | POA: Insufficient documentation

## 2016-01-27 DIAGNOSIS — Z79899 Other long term (current) drug therapy: Secondary | ICD-10-CM | POA: Diagnosis not present

## 2016-01-27 DIAGNOSIS — J449 Chronic obstructive pulmonary disease, unspecified: Secondary | ICD-10-CM | POA: Diagnosis not present

## 2016-01-27 DIAGNOSIS — E119 Type 2 diabetes mellitus without complications: Secondary | ICD-10-CM | POA: Insufficient documentation

## 2016-01-27 DIAGNOSIS — J45909 Unspecified asthma, uncomplicated: Secondary | ICD-10-CM | POA: Diagnosis not present

## 2016-01-27 DIAGNOSIS — R0602 Shortness of breath: Secondary | ICD-10-CM | POA: Insufficient documentation

## 2016-01-27 DIAGNOSIS — Z791 Long term (current) use of non-steroidal anti-inflammatories (NSAID): Secondary | ICD-10-CM | POA: Diagnosis not present

## 2016-01-27 DIAGNOSIS — Z7951 Long term (current) use of inhaled steroids: Secondary | ICD-10-CM | POA: Insufficient documentation

## 2016-01-27 DIAGNOSIS — R0600 Dyspnea, unspecified: Secondary | ICD-10-CM | POA: Diagnosis not present

## 2016-01-27 LAB — BASIC METABOLIC PANEL
Anion gap: 14 (ref 5–15)
BUN: 22 mg/dL — ABNORMAL HIGH (ref 6–20)
CO2: 21 mmol/L — ABNORMAL LOW (ref 22–32)
Calcium: 9 mg/dL (ref 8.9–10.3)
Chloride: 103 mmol/L (ref 101–111)
Creatinine, Ser: 0.79 mg/dL (ref 0.44–1.00)
GFR calc Af Amer: >60 mL/min (ref >60)
GFR calc non Af Amer: >60 mL/min (ref >60)
Glucose, Bld: 118 mg/dL — ABNORMAL HIGH (ref 65–99)
Potassium: 3 mmol/L — ABNORMAL LOW (ref 3.5–5.1)
Sodium: 138 mmol/L (ref 135–145)

## 2016-01-27 MED ORDER — IPRATROPIUM-ALBUTEROL 0.5-2.5 (3) MG/3ML IN SOLN
3.0000 mL | Freq: Once | RESPIRATORY_TRACT | Status: DC
  Filled 2016-01-27: qty 3

## 2016-01-27 MED ORDER — MAGNESIUM SULFATE 2 GM/50ML IV SOLN
2.0000 g | Freq: Once | INTRAVENOUS | Status: DC
  Filled 2016-01-27: qty 50

## 2016-01-27 MED ORDER — IPRATROPIUM-ALBUTEROL 0.5-2.5 (3) MG/3ML IN SOLN
3.0000 mL | Freq: Once | RESPIRATORY_TRACT | Status: AC
  Administered 2016-01-27: 3 mL via RESPIRATORY_TRACT

## 2016-01-27 MED ORDER — ALBUTEROL SULFATE (2.5 MG/3ML) 0.083% IN NEBU
5.0000 mg | INHALATION_SOLUTION | Freq: Once | RESPIRATORY_TRACT | Status: AC
  Administered 2016-01-27: 5 mg via RESPIRATORY_TRACT
  Filled 2016-01-27: qty 3

## 2016-01-27 MED ORDER — LORAZEPAM 2 MG/ML IJ SOLN
1.0000 mg | Freq: Once | INTRAMUSCULAR | Status: AC
  Administered 2016-01-27: 1 mg via INTRAVENOUS
  Filled 2016-01-27: qty 1

## 2016-01-27 MED ORDER — ALBUTEROL SULFATE (2.5 MG/3ML) 0.083% IN NEBU
5.0000 mg | INHALATION_SOLUTION | Freq: Once | RESPIRATORY_TRACT | Status: AC
  Administered 2016-01-27: 5 mg via RESPIRATORY_TRACT
  Filled 2016-01-27: qty 6

## 2016-01-27 MED ORDER — MAGNESIUM SULFATE 2 GM/50ML IV SOLN
2.0000 g | Freq: Once | INTRAVENOUS | Status: AC
  Administered 2016-01-27: 2 g via INTRAVENOUS

## 2016-01-27 MED ORDER — ALBUTEROL SULFATE (2.5 MG/3ML) 0.083% IN NEBU
INHALATION_SOLUTION | RESPIRATORY_TRACT | Status: AC
  Filled 2016-01-27: qty 3

## 2016-01-27 MED ORDER — METHYLPREDNISOLONE SODIUM SUCC 125 MG IJ SOLR
125.0000 mg | Freq: Once | INTRAMUSCULAR | Status: DC
  Filled 2016-01-27: qty 2

## 2016-01-27 MED ORDER — METHYLPREDNISOLONE SODIUM SUCC 125 MG IJ SOLR
125.0000 mg | Freq: Once | INTRAMUSCULAR | Status: AC
  Administered 2016-01-27: 125 mg via INTRAVENOUS

## 2016-01-27 MED ORDER — IPRATROPIUM-ALBUTEROL 0.5-2.5 (3) MG/3ML IN SOLN
RESPIRATORY_TRACT | Status: AC
  Administered 2016-01-27: 3 mL via RESPIRATORY_TRACT
  Filled 2016-01-27: qty 3

## 2016-01-27 NOTE — ED Triage Notes (Signed)
Pt presents to ED with c/o shortness of breath that started yesterday and "gotten worse". Pt reports using home nebulizer without relief. Pt reports h/x of asthma, non-smoker. Pt reports being d/c'd from the hospital last Friday for asthma complications. Pt is A&O, in moderate respiratory distress. No auditory wheezes auscultated, breath sounds diminished in all fields. Pt's O2 sats in triage 93%, RR 28 breaths per minute; pt placed on 2L O2 via [place] for comfort. O2 sats noted to improve to 97%.

## 2016-01-27 NOTE — ED Notes (Signed)
Pt c/o shortness of breath, pt breathing rapidly, encouraged pt to take slow deep breaths. Pt tearful. Verbal order for DUO neb provided by MD.

## 2016-01-27 NOTE — ED Provider Notes (Signed)
[facility] Emergency Department Provider Note   ____________________________________________   I have reviewed the triage vital signs and the nursing notes.   HISTORY  Chief Complaint Shortness of Breath   History limited by: Not Limited   HPI Summer Murphy is a 60 y.o. female with history of asthma, recent hospitalization who presents to the emergency department today because of concern for shortness of breath. The patient states that it started yesterday. Has gotten worse. Feels like she is suffocating. The patient has been trying her home breathing treatments without any relief. The patient was recently discharged from the hospital and states that she has continued her prednisone taper.    Past Medical History:  Diagnosis Date  . Acid reflux   . Anxiety   . Arrhythmia    treated with meds and has no current problems  . Arthritis    most uncomfortable in knees  . Asthma    uses several inhalers  . Depression   . Diabetes mellitus without complication (HCC)    sugars run low, not high sugars  . Fever blister   . Hematuria   . Hypertension   . Kidney stone    stones, 2nd lithotripsy  . Left flank pain   . Migraine   . Restless leg   . Yeast vaginitis     Patient Active Problem List   Diagnosis Date Noted  . Asthma 01/20/2016  . Severe recurrent major depression without psychotic features ([place]) 09/10/2014  . COPD (chronic obstructive pulmonary disease) ([place]) 09/10/2014  . Calculi, ureter 04/26/2013  . Corneal graft malfunction 08/17/2012  . Calculus of kidney 04/26/2012  . Renal colic [id number]  . Urge incontinence 04/26/2012  . Diaphragmatic hernia 10/27/2010  . Barrett esophagus 07/07/2010    Past Surgical History:  Procedure Laterality Date  . APPENDECTOMY  1990  . CARDIAC ELECTROPHYSIOLOGY STUDY AND ABLATION    . CHOLECYSTECTOMY  1990  . EXTRACORPOREAL SHOCK WAVE LITHOTRIPSY Left 09/12/2015   Procedure: EXTRACORPOREAL  SHOCK WAVE LITHOTRIPSY (ESWL);  Surgeon: Hollice Espy, MD;  Location: [facility] ORS;  Service: Urology;  Laterality: Left;  . HH repair    . JOINT REPLACEMENT Bilateral 2013,2014   total knees  . LAPAROSCOPIC HYSTERECTOMY    . LITHOTRIPSY    . URETEROSCOPY      Prior to Admission medications   Medication Sig Start Date End Date Taking? Authorizing Provider  budesonide-formoterol (SYMBICORT) 160-4.5 MCG/ACT inhaler Inhale 2 puffs into the lungs 2 (two) times daily.   Yes Historical Provider, MD  butalbital-acetaminophen-caffeine (FIORICET, ESGIC) 50-325-40 MG tablet Take 1 tablet by mouth every 4 (four) hours as needed.  02/23/14  Yes Historical Provider, MD  cetirizine (ZYRTEC) 10 MG tablet Take 10 mg by mouth daily.   Yes Historical Provider, MD  cyclobenzaprine (FLEXERIL) 10 MG tablet Take 1 tablet (10 mg total) by mouth at bedtime. 01/22/16 01/21/17 Yes Sital Mody, MD  diclofenac sodium (VOLTAREN) 1 % GEL Apply 2 g topically 2 (two) times daily as needed (for pain).    Yes Historical Provider, MD  diltiazem (DILACOR XR) 180 MG 24 hr capsule Take 180 mg by mouth daily.   Yes Historical Provider, MD  diphenhydrAMINE (BENADRYL) 25 MG tablet Take 25 mg by mouth every 6 (six) hours as needed for itching or allergies.    Yes Historical Provider, MD  EPINEPHrine 0.3 mg/0.3 mL IJ SOAJ injection Inject 0.3 mg into the muscle once.   Yes Historical Provider, MD  estrogens, conjugated, (PREMARIN)  0.45 MG tablet Take 0.45 mg by mouth every evening. Reported on 09/09/2015   Yes Historical Provider, MD  fluticasone (FLONASE) 50 MCG/ACT nasal spray Place 2 sprays into both nostrils daily.   Yes Historical Provider, MD  gabapentin (NEURONTIN) 400 MG capsule Take 400 mg by mouth 2 (two) times daily.   Yes Historical Provider, MD  Ipratropium-Albuterol (COMBIVENT) 20-100 MCG/ACT AERS respimat Inhale 1 puff into the lungs 4 (four) times daily.   Yes Historical Provider, MD  ipratropium-albuterol (DUONEB) 0.5-2.5 (3)  MG/3ML SOLN Take 3 mLs by nebulization every 4 (four) hours as needed (for shortness of breath/wheezing).    Yes Historical Provider, MD  levofloxacin (LEVAQUIN) 750 MG tablet Take 1 tablet (750 mg total) by mouth daily. 01/23/16  Yes Sital Mody, MD  montelukast (SINGULAIR) 10 MG tablet Take 10 mg by mouth at bedtime.   Yes Historical Provider, MD  Olopatadine HCl 0.2 % SOLN Place 1 drop into both eyes at bedtime.   Yes Historical Provider, MD  omeprazole (PRILOSEC) 20 MG capsule Take 20 mg by mouth 2 (two) times daily.   Yes Historical Provider, MD  predniSONE (STERAPRED UNI-PAK 21 TAB) 10 MG (21) TBPK tablet Take 1 tablet (10 mg total) by mouth daily. 60 mg PO (ORAL) x 2 days 50 mg PO (ORAL)  x 2 days 40 mg PO (ORAL)  x 2 days 30 mg PO  (ORAL)  x 2 days 20 mg PO  (ORAL) x 2 days 10 mg PO  (ORAL) x 2 days then stop 01/22/16  Yes Sital Mody, MD  rOPINIRole (REQUIP) 0.25 MG tablet Take 0.25 mg by mouth 2 (two) times daily.   Yes Historical Provider, MD  tiotropium (SPIRIVA) 18 MCG inhalation capsule Place 18 mcg into inhaler and inhale daily.   Yes Historical Provider, MD  traMADol (ULTRAM) 50 MG tablet Take 50-100 mg by mouth every 6 (six) hours as needed for moderate pain or severe pain. Reported on 09/12/2015   Yes Historical Provider, MD  traZODone (DESYREL) 100 MG tablet Take 100 mg by mouth at bedtime. Reported on 09/12/2015   Yes Historical Provider, MD  valACYclovir (VALTREX) 500 MG tablet Take 500 mg by mouth 2 (two) times daily as needed (for fever blisters).    Yes Historical Provider, MD  ondansetron (ZOFRAN ODT) 4 MG disintegrating tablet Take 1 tablet (4 mg total) by mouth every 8 (eight) hours as needed for nausea or vomiting. 09/02/15   Carrie Mew, MD  oxyCODONE-acetaminophen (PERCOCET) 5-325 MG tablet Take 1 tablet by mouth every 6 (six) hours as needed for severe pain. 09/26/15   Nori Riis, PA-C    Allergies Librium [chlordiazepoxide]; Nsaids; Vanilla; Aspirin;  Azithromycin; Buprenorphine hcl; Chlordiazepoxide hcl; Iron; Morphine and related; Reglan [metoclopramide]; Sulfa antibiotics; and Tolmetin  Family History  Problem Relation Age of Onset  . Prostate cancer Father   . Kidney Stones Father   . Kidney disease Neg Hx     Social History Social History  Substance Use Topics  . Smoking status: Never Smoker  . Smokeless tobacco: Never Used  . Alcohol use No    Review of Systems Constitutional: Negative for fever. Cardiovascular: Negative for chest pain. Respiratory: Positive for shortness of breath. Gastrointestinal: Negative for abdominal pain, vomiting and diarrhea. Neurological: Negative for headaches, focal weakness or numbness.  10-point ROS otherwise negative.  ____________________________________________   PHYSICAL EXAM:  VITAL SIGNS: ED Triage Vitals  Enc Vitals Group     BP 01/27/16 1909 (!) 156/114  Pulse Rate 01/27/16 1909 (!) 119     Resp 01/27/16 1909 (!) 22     Temp 01/27/16 1909 98.4 F (36.9 C)     Temp src --      SpO2 01/27/16 1909 100 %     Weight 01/27/16 1913 170 lb (77.1 kg)     Height 01/27/16 1913 5\' 3"  (1.6 m)     Head Circumference --      Peak Flow --      Pain Score 01/27/16 1930 0   Constitutional: Alert and oriented. Appears anxious. Eyes: Conjunctivae are normal. Normal extraocular movements. ENT   Head: Normocephalic and atraumatic.   Nose: No congestion/rhinnorhea.   Mouth/Throat: Mucous membranes are moist.   Neck: No stridor. Hematological/Lymphatic/Immunilogical: No cervical lymphadenopathy. Cardiovascular: Tachycardic, regular rhythm.  No murmurs, rubs, or gallops. Respiratory: Increased respiratory effort with diffuse bilateral expiratory wheezing. Gastrointestinal: Soft and nontender. No distention.  Genitourinary: Deferred Musculoskeletal: Normal range of motion in all extremities. No lower extremity edema. Neurologic:  Normal speech and language. No gross  focal neurologic deficits are appreciated.  Skin:  Skin is warm, dry and intact. No rash noted. Psychiatric: Anxious  ____________________________________________    LABS (pertinent positives/negatives)  Labs Reviewed  BLOOD GAS, VENOUS - Abnormal; Notable for the following:       Result Value   pH, Ven 7.60 (*)    pCO2, Ven 24 (*)    pO2, Ven 189.0 (*)    Acid-Base Excess 3.3 (*)    All other components within normal limits  CBC WITH DIFFERENTIAL/PLATELET - Abnormal; Notable for the following:    WBC 21.2 (*)    Neutro Abs 16.1 (*)    Lymphs Abs 4.5 (*)    All other components within normal limits  BASIC METABOLIC PANEL - Abnormal; Notable for the following:    Potassium 3.0 (*)    CO2 21 (*)    Glucose, Bld 118 (*)    BUN 22 (*)    All other components within normal limits     ____________________________________________   EKG  I, Nance Pear, attending physician, personally viewed and interpreted this EKG  EKG Time: 1912 Rate: 106 Rhythm: sinus tachycardia Axis: left axis deviation Intervals: qtc 454 QRS: LBBB ST changes: no st elevation equivalent Impression: abnormal ekg  ____________________________________________    RADIOLOGY  CXR IMPRESSION: No active cardiopulmonary disease. Moderate-sized hiatal hernia unchanged in appearance.   ____________________________________________   PROCEDURES  Procedures  ____________________________________________   INITIAL IMPRESSION / ASSESSMENT AND PLAN / ED COURSE  Pertinent labs & imaging results that were available during my care of the patient were reviewed by me and considered in my medical decision making (see chart for details).  History of asthma. CXR without acute findings. Some wheezing on exam. Also some anxiety. Will check blood work.   Clinical Course    Blood work shows a leukocytosis which is likely secondary to steroid use, no evidence of pneumonia on CXR and no left shift.  Additionally ph of 7.6 - think this could be due to anxiety and some hyperventilation, certainly patient is not retaining CO2. Patient appeared and stated she felt better after breathing treatments and anxiety medication. Repeat auscultation with better air movement. Patient felt comfortable going home. Will discharge with anxiety medication. ____________________________________________   FINAL CLINICAL IMPRESSION(S) / ED DIAGNOSES  Final diagnoses:  SOB (shortness of breath)  Anxiety     Note: This dictation was prepared with Dragon dictation. Any transcriptional errors that  result from this process are unintentional    Nance Pear, MD 01/28/16 [phone]

## 2016-01-27 NOTE — ED Notes (Signed)
Patient brought to ED11 after SOB protocols performed out front.  She was in SUBW retracting and satting 98% and connected to an oxygen bottle at the end of her duoneb treatment.  MD notified of her retractions and her anxiety hx.

## 2016-01-28 DIAGNOSIS — J4540 Moderate persistent asthma, uncomplicated: Secondary | ICD-10-CM | POA: Diagnosis not present

## 2016-01-28 DIAGNOSIS — R0602 Shortness of breath: Secondary | ICD-10-CM | POA: Diagnosis not present

## 2016-01-28 LAB — CBC WITH DIFFERENTIAL/PLATELET
Band Neutrophils: 0 %
Basophils Absolute: 0 10*3/uL (ref 0–0.1)
Basophils Relative: 0 %
Blasts: 0 %
Eosinophils Absolute: 0 10*3/uL (ref 0–0.7)
Eosinophils Relative: 0 %
HCT: 37.6 % (ref 35.0–47.0)
Hemoglobin: 13 g/dL (ref 12.0–16.0)
Lymphocytes Relative: 21 %
Lymphs Abs: 4.5 10*3/uL — ABNORMAL HIGH (ref 1.0–3.6)
MCH: 30.2 pg (ref 26.0–34.0)
MCHC: 34.5 g/dL (ref 32.0–36.0)
MCV: 87.6 fL (ref 80.0–100.0)
Metamyelocytes Relative: 1 %
Monocytes Absolute: 0.6 10*3/uL (ref 0.2–0.9)
Monocytes Relative: 3 %
Myelocytes: 0 %
Neutro Abs: 16.1 10*3/uL — ABNORMAL HIGH (ref 1.4–6.5)
Neutrophils Relative %: 75 %
Other: 0 %
Platelets: 303 10*3/uL (ref 150–440)
Promyelocytes Absolute: 0 %
RBC: 4.29 MIL/uL (ref 3.80–5.20)
RDW: 14.4 % (ref 11.5–14.5)
WBC: 21.2 10*3/uL — ABNORMAL HIGH (ref 3.6–11.0)
nRBC: 0 /100 WBC

## 2016-01-28 LAB — BLOOD GAS, VENOUS
Acid-Base Excess: 3.3 mmol/L — ABNORMAL HIGH (ref 0.0–2.0)
Bicarbonate: 23.6 mmol/L (ref 20.0–28.0)
O2 Saturation: 99.8 %
Patient temperature: 37
pCO2, Ven: 24 mmHg — ABNORMAL LOW (ref 44.0–60.0)
pH, Ven: 7.6 — ABNORMAL HIGH (ref 7.250–7.430)
pO2, Ven: 189 mmHg — ABNORMAL HIGH (ref 32.0–45.0)

## 2016-01-28 MED ORDER — LORAZEPAM 0.5 MG PO TABS: 0.5000 mg | ORAL_TABLET | Freq: Three times a day (TID) | ORAL | 0 refills | Status: DC | PRN

## 2016-01-28 NOTE — ED Notes (Signed)
RT called to pick up VBG sample.

## 2016-01-28 NOTE — Discharge Instructions (Signed)
Please seek medical attention for any high fevers, chest pain, shortness of breath, change in behavior, persistent vomiting, bloody stool or any other new or concerning symptoms.  

## 2016-02-07 DIAGNOSIS — R0602 Shortness of breath: Secondary | ICD-10-CM | POA: Diagnosis not present

## 2016-02-12 DIAGNOSIS — R0602 Shortness of breath: Secondary | ICD-10-CM | POA: Diagnosis not present

## 2016-02-19 DIAGNOSIS — J301 Allergic rhinitis due to pollen: Secondary | ICD-10-CM | POA: Diagnosis not present

## 2016-02-19 DIAGNOSIS — R0602 Shortness of breath: Secondary | ICD-10-CM | POA: Diagnosis not present

## 2016-04-17 DIAGNOSIS — R0602 Shortness of breath: Secondary | ICD-10-CM | POA: Diagnosis not present

## 2016-04-17 DIAGNOSIS — J4541 Moderate persistent asthma with (acute) exacerbation: Secondary | ICD-10-CM | POA: Diagnosis not present

## 2016-04-18 ENCOUNTER — Emergency Department: Payer: Medicare Other

## 2016-04-18 ENCOUNTER — Encounter: Payer: Self-pay | Admitting: Emergency Medicine

## 2016-04-18 ENCOUNTER — Emergency Department
Admission: EM | Admit: 2016-04-18 | Discharge: 2016-04-18 | Disposition: A | Discharge status: Home / Self Care | Payer: Medicare Other | Attending: Emergency Medicine | Admitting: Emergency Medicine

## 2016-04-18 DIAGNOSIS — R0602 Shortness of breath: Secondary | ICD-10-CM | POA: Diagnosis present

## 2016-04-18 DIAGNOSIS — I10 Essential (primary) hypertension: Secondary | ICD-10-CM | POA: Diagnosis not present

## 2016-04-18 DIAGNOSIS — E119 Type 2 diabetes mellitus without complications: Secondary | ICD-10-CM | POA: Insufficient documentation

## 2016-04-18 DIAGNOSIS — Z79899 Other long term (current) drug therapy: Secondary | ICD-10-CM | POA: Insufficient documentation

## 2016-04-18 DIAGNOSIS — J441 Chronic obstructive pulmonary disease with (acute) exacerbation: Secondary | ICD-10-CM | POA: Diagnosis not present

## 2016-04-18 DIAGNOSIS — R0600 Dyspnea, unspecified: Secondary | ICD-10-CM | POA: Diagnosis not present

## 2016-04-18 DIAGNOSIS — J45909 Unspecified asthma, uncomplicated: Secondary | ICD-10-CM | POA: Insufficient documentation

## 2016-04-18 LAB — CBC WITH DIFFERENTIAL/PLATELET
Basophils Absolute: 0 10*3/uL (ref 0–0.1)
Basophils Relative: 0 %
Eosinophils Absolute: 0 10*3/uL (ref 0–0.7)
Eosinophils Relative: 0 %
HCT: 38.5 % (ref 35.0–47.0)
Hemoglobin: 12.8 g/dL (ref 12.0–16.0)
Lymphocytes Relative: 11 %
Lymphs Abs: 0.9 10*3/uL — ABNORMAL LOW (ref 1.0–3.6)
MCH: 29.6 pg (ref 26.0–34.0)
MCHC: 33.2 g/dL (ref 32.0–36.0)
MCV: 89.3 fL (ref 80.0–100.0)
Monocytes Absolute: 0.1 10*3/uL — ABNORMAL LOW (ref 0.2–0.9)
Monocytes Relative: 2 %
Neutro Abs: 6.9 10*3/uL — ABNORMAL HIGH (ref 1.4–6.5)
Neutrophils Relative %: 87 %
Platelets: 270 10*3/uL (ref 150–440)
RBC: 4.31 MIL/uL (ref 3.80–5.20)
RDW: 13.7 % (ref 11.5–14.5)
WBC: 8 10*3/uL (ref 3.6–11.0)

## 2016-04-18 LAB — INFLUENZA PANEL BY PCR (TYPE A & B)
Influenza A By PCR: NEGATIVE
Influenza B By PCR: NEGATIVE

## 2016-04-18 LAB — BASIC METABOLIC PANEL
Anion gap: 13 (ref 5–15)
BUN: 14 mg/dL (ref 6–20)
CO2: 22 mmol/L (ref 22–32)
Calcium: 9 mg/dL (ref 8.9–10.3)
Chloride: 102 mmol/L (ref 101–111)
Creatinine, Ser: 0.58 mg/dL (ref 0.44–1.00)
GFR calc Af Amer: >60 mL/min (ref >60)
GFR calc non Af Amer: >60 mL/min (ref >60)
Glucose, Bld: 180 mg/dL — ABNORMAL HIGH (ref 65–99)
Potassium: 3.5 mmol/L (ref 3.5–5.1)
Sodium: 137 mmol/L (ref 135–145)

## 2016-04-18 LAB — FIBRIN DERIVATIVES D-DIMER (ARMC ONLY): Fibrin derivatives D-dimer (ARMC): 547 — ABNORMAL HIGH (ref 0–499)

## 2016-04-18 MED ORDER — LORAZEPAM 2 MG/ML IJ SOLN
1.0000 mg | Freq: Once | INTRAMUSCULAR | Status: AC
  Administered 2016-04-18: 1 mg via INTRAVENOUS
  Filled 2016-04-18: qty 1

## 2016-04-18 MED ORDER — LORAZEPAM 1 MG PO TABS: 1.0000 mg | ORAL_TABLET | Freq: Two times a day (BID) | ORAL | 0 refills | Status: DC | PRN

## 2016-04-18 MED ORDER — IPRATROPIUM-ALBUTEROL 0.5-2.5 (3) MG/3ML IN SOLN
6.0000 mL | Freq: Once | RESPIRATORY_TRACT | Status: AC
  Administered 2016-04-18: 6 mL via RESPIRATORY_TRACT

## 2016-04-18 MED ORDER — IPRATROPIUM-ALBUTEROL 0.5-2.5 (3) MG/3ML IN SOLN
RESPIRATORY_TRACT | Status: AC
  Administered 2016-04-18: 6 mL via RESPIRATORY_TRACT
  Filled 2016-04-18: qty 6

## 2016-04-18 MED ORDER — METHYLPREDNISOLONE SODIUM SUCC 125 MG IJ SOLR
125.0000 mg | Freq: Once | INTRAMUSCULAR | Status: AC
Start: 2016-04-18 — End: 2016-04-18
  Administered 2016-04-18: 125 mg via INTRAVENOUS
  Filled 2016-04-18: qty 2

## 2016-04-18 MED ORDER — IPRATROPIUM-ALBUTEROL 0.5-2.5 (3) MG/3ML IN SOLN
3.0000 mL | Freq: Once | RESPIRATORY_TRACT | Status: AC
  Administered 2016-04-18: 3 mL via RESPIRATORY_TRACT
  Filled 2016-04-18: qty 3

## 2016-04-18 MED ORDER — PREDNISONE 20 MG PO TABS: 40.0000 mg | ORAL_TABLET | Freq: Every day | ORAL | 0 refills | Status: AC

## 2016-04-18 NOTE — ED Provider Notes (Signed)
Time Seen: Approximately 1750  I have reviewed the triage notes  Chief Complaint: Shortness of Breath   History of Present Illness: Summer Murphy is a 61 y.o. female *acute onset of difficulty breathing at home that started earlier today. Patient has a known history of asthma as received 3 treatments at home and also started on DuoNeb here in emergency department. She is on daily prednisone states she took 30 mg prednisone this a.m. Patient denies any fever or productive nature to her cough. She denies any history of blood clots in the legs or lungs. She currently denies any chest pain. She does have a history of anxiety and arrives very anxious jittery possibly from the treatment and/or the anxiety.* Patient is currently not on home oxygen supplemental therapy.  Past Medical History:  Diagnosis Date  . Acid reflux   . Anxiety   . Arrhythmia    treated with meds and has no current problems  . Arthritis    most uncomfortable in knees  . Asthma    uses several inhalers  . Depression   . Diabetes mellitus without complication (HCC)    sugars run low, not high sugars  . Fever blister   . Hematuria   . Hypertension   . Kidney stone    stones, 2nd lithotripsy  . Left flank pain   . Migraine   . Restless leg   . Yeast vaginitis     Patient Active Problem List   Diagnosis Date Noted  . Asthma 01/20/2016  . Severe recurrent major depression without psychotic features ([place]) 09/10/2014  . COPD (chronic obstructive pulmonary disease) ([place]) 09/10/2014  . Calculi, ureter 04/26/2013  . Corneal graft malfunction 08/17/2012  . Calculus of kidney 04/26/2012  . Renal colic [id number]  . Urge incontinence 04/26/2012  . Diaphragmatic hernia 10/27/2010  . Barrett esophagus 07/07/2010    Past Surgical History:  Procedure Laterality Date  . APPENDECTOMY  1990  . CARDIAC ELECTROPHYSIOLOGY STUDY AND ABLATION    . CHOLECYSTECTOMY  1990  . EXTRACORPOREAL SHOCK WAVE LITHOTRIPSY Left  09/12/2015   Procedure: EXTRACORPOREAL SHOCK WAVE LITHOTRIPSY (ESWL);  Surgeon: Hollice Espy, MD;  Location: [facility] ORS;  Service: Urology;  Laterality: Left;  . HH repair    . JOINT REPLACEMENT Bilateral 2013,2014   total knees  . LAPAROSCOPIC HYSTERECTOMY    . LITHOTRIPSY    . URETEROSCOPY      Past Surgical History:  Procedure Laterality Date  . APPENDECTOMY  1990  . CARDIAC ELECTROPHYSIOLOGY STUDY AND ABLATION    . CHOLECYSTECTOMY  1990  . EXTRACORPOREAL SHOCK WAVE LITHOTRIPSY Left 09/12/2015   Procedure: EXTRACORPOREAL SHOCK WAVE LITHOTRIPSY (ESWL);  Surgeon: Hollice Espy, MD;  Location: [facility] ORS;  Service: Urology;  Laterality: Left;  . HH repair    . JOINT REPLACEMENT Bilateral 2013,2014   total knees  . LAPAROSCOPIC HYSTERECTOMY    . LITHOTRIPSY    . URETEROSCOPY      Current Outpatient Rx  . Order #: [id number] Class: Historical Med  . Order #: [id number] Class: Historical Med  . Order #: [id number] Class: Historical Med  . Order #: [id number] Class: Print  . Order #: [id number] Class: Historical Med  . Order #: [id number] Class: Historical Med  . Order #: [id number] Class: Historical Med  . Order #: [id number] Class: Historical Med  . Order #: [id number] Class: Historical Med  . Order #: [id number] Class: Historical Med  . Order #: [id number] Class: Historical Med  . Order #: [id number] Class: Historical Med  . Order #: [id number] Class: Historical Med  .  Order #: [id number] Class: Normal  . Order #: [id number] Class: Print  . Order #: [id number] Class: Historical Med  . Order #: [id number] Class: Historical Med  . Order #: [id number] Class: Historical Med  . Order #: [id number] Class: Print  . Order #: [id number] Class: Print  . Order #: [id number] Class: Normal  . Order #: [id number] Class: Historical Med  . Order #: [id number] Class: Historical Med  . Order #: [id number] Class: Historical Med  . Order #: [id number] Class: Historical Med  . Order #: [id number] Class: Historical Med    Allergies:   Librium [chlordiazepoxide]; Nsaids; Vanilla; Aspirin; Azithromycin; Buprenorphine hcl; Chlordiazepoxide hcl; Iron; Morphine and related; Reglan [metoclopramide]; Sulfa antibiotics; and Tolmetin  Family History: Family History  Problem Relation Age of Onset  . Prostate cancer Father   . Kidney Stones Father   . Kidney disease Neg Hx     Social History: Social History  Substance Use Topics  . Smoking status: Never Smoker  . Smokeless tobacco: Never Used  . Alcohol use No     Review of Systems:   10 point review of systems was performed and was otherwise negative:  Constitutional: No fever Eyes: No visual disturbances ENT: No sore throat, ear pain Cardiac: No chest pain Respiratory:Shortness of breath as stated wheezing at home without stridor Abdomen: No abdominal pain, no vomiting, No diarrhea Endocrine: No weight loss, No night sweats Extremities: She denies any peripheral edema, calf tenderness or tenderness in the lower extremities. Skin: No rashes, easy bruising Neurologic: No focal weakness, trouble with speech or swollowing Urologic: No dysuria, Hematuria, or urinary frequency   Physical Exam:  ED Triage Vitals [04/18/16 1728]  Enc Vitals Group     BP (!) 120/95     Pulse Rate (!) 120     Resp (!) 26     Temp 98.1 F (36.7 C)     Temp Source Oral     SpO2 97 %     Weight      Height      Head Circumference      Peak Flow      Pain Score 0     Pain Loc      Pain Edu?      Excl. in [place]?     General: Awake , Alert , and Oriented times 3; GCS 15 Anxious with resting diffuse tremor Head: Normal cephalic , atraumatic Eyes: Pupils equal , round, reactive to light Nose/Throat: No nasal drainage, patent upper airway without erythema or exudate.  Neck: Supple, Full range of motion, No anterior adenopathy or palpable thyroid masses Lungs: Limited F word but no obvious wheezing auscultated throughout the lung fields. No rales or rhonchi  Heart: Regular rate,  regular rhythm without murmurs , gallops , or rubs Abdomen: Soft, non tender without rebound, guarding , or rigidity; bowel sounds positive and symmetric in all 4 quadrants. No organomegaly .        Extremities: 2 plus symmetric pulses. No edema, clubbing or cyanosis Neurologic: normal ambulation, Motor symmetric without deficits, sensory intact Skin: warm, dry, no rashes   Labs:   All laboratory work was reviewed including any pertinent negatives or positives listed below:  Labs Reviewed  INFLUENZA PANEL BY PCR (TYPE A & B)  CBC WITH DIFFERENTIAL/PLATELET  BASIC METABOLIC PANEL  FIBRIN DERIVATIVES D-DIMER ([place])  Patient's d-dimer is slightly elevated though I felt with a low clinical risk that further CAT scan evaluation was not necessary based on the YEAR study EKG:  ED ECG REPORT I, Denice Bors  Marcelene Butte, the attending physician, personally viewed and interpreted this ECG.  Date: 04/18/2016 EKG Time: 1741 Rate: *122 Rhythm: Sinus tachycardia QRS Axis: normal Intervals: Left bundle branch block ST/T Wave abnormalities: normal Conduction Disturbances: none Narrative Interpretation: unremarkable *No acute ischemic changes   Radiology:  "Dg Chest 2 View  Result Date: 04/18/2016 CLINICAL DATA:  Difficulty breathing EXAM: CHEST  2 VIEW COMPARISON:  01/27/2016 FINDINGS: Cardiac shadow is within normal limits. Hiatal hernia is noted. Postsurgical changes at the upper abdomen are seen. The lungs are clear bilaterally. No focal infiltrate or sizable effusion is seen. Chronic compression deformity of lower thoracic spine is noted. IMPRESSION: Hiatal hernia.  No acute abnormality noted. Electronically Signed   By: Inez Catalina M.D.   On: 04/18/2016 18:02  "  I personally reviewed the radiologic studies    ED Course:  Patient's stay here was uneventful and she was improved with oral Ativan for the anxiety component along with 2 DuoNeb labs. Patient feels improvement enough to be  discharged at this time. Her pulse ox remained stable throughout her stay. She has some mild wheezing auscultated the apices symmetrically. From a clinical standpoint again I felt this was unlikely to be a pulmonary embolism and I felt chest CT was not necessary at this time.   Patient states she has nebulizer at home. Patient's eye was bruising on her prednisone for the next 5 days.   Assessment:  Acute exacerbation of chronic obstructive pulmonary disease Anxiety     Plan:  Outpatient " Discharge Medication List as of 04/18/2016  9:21 PM    START taking these medications   Details  predniSONE (DELTASONE) 20 MG tablet Take 2 tablets (40 mg total) by mouth daily., Starting Sun 04/19/2016, Until Fri 04/24/2016, Print      " Patient was advised to return immediately if condition worsens. Patient was advised to follow up with their primary care physician or other specialized physicians involved in their outpatient care. The patient and/or family member/power of attorney had laboratory results reviewed at the bedside. All questions and concerns were addressed and appropriate discharge instructions were distributed by the nursing staff.             Daymon Larsen, MD 04/18/16 2245

## 2016-04-18 NOTE — ED Triage Notes (Signed)
Pt reports difficulty breathing, hx of asthma. Took 3 treatments today, did not help. Also took 60mg  of Prednisone PO this morning.

## 2016-04-18 NOTE — ED Notes (Signed)
D/w Dr. Alfred Levins. Do CXR, EKG and Duoneb x 2

## 2016-04-18 NOTE — Discharge Instructions (Signed)
Please return immediately if condition worsens. Please contact her primary physician or the physician you were given for referral. If you have any specialist physicians involved in her treatment and plan please also contact them. Thank you for using [facility]. ° °

## 2016-04-18 NOTE — ED Notes (Signed)

## 2016-04-28 DIAGNOSIS — K2930 Chronic superficial gastritis without bleeding: Secondary | ICD-10-CM | POA: Diagnosis not present

## 2016-04-28 DIAGNOSIS — Z09 Encounter for follow-up examination after completed treatment for conditions other than malignant neoplasm: Secondary | ICD-10-CM | POA: Diagnosis not present

## 2016-04-28 DIAGNOSIS — I10 Essential (primary) hypertension: Secondary | ICD-10-CM | POA: Diagnosis not present

## 2016-04-28 DIAGNOSIS — K2270 Barrett's esophagus without dysplasia: Secondary | ICD-10-CM | POA: Diagnosis not present

## 2016-04-28 DIAGNOSIS — I491 Atrial premature depolarization: Secondary | ICD-10-CM | POA: Diagnosis not present

## 2016-04-28 DIAGNOSIS — Z9889 Other specified postprocedural states: Secondary | ICD-10-CM | POA: Diagnosis not present

## 2016-04-28 DIAGNOSIS — J45909 Unspecified asthma, uncomplicated: Secondary | ICD-10-CM | POA: Diagnosis not present

## 2016-04-28 DIAGNOSIS — K222 Esophageal obstruction: Secondary | ICD-10-CM | POA: Diagnosis not present

## 2016-04-28 DIAGNOSIS — K449 Diaphragmatic hernia without obstruction or gangrene: Secondary | ICD-10-CM | POA: Diagnosis not present

## 2016-05-21 ENCOUNTER — Encounter: Payer: Self-pay | Admitting: Emergency Medicine

## 2016-05-21 ENCOUNTER — Emergency Department
Admission: EM | Admit: 2016-05-21 | Discharge: 2016-05-21 | Disposition: A | Discharge status: Home / Self Care | Payer: Medicare Other | Attending: Emergency Medicine | Admitting: Emergency Medicine

## 2016-05-21 DIAGNOSIS — E119 Type 2 diabetes mellitus without complications: Secondary | ICD-10-CM | POA: Diagnosis not present

## 2016-05-21 DIAGNOSIS — T7840XA Allergy, unspecified, initial encounter: Secondary | ICD-10-CM

## 2016-05-21 DIAGNOSIS — J449 Chronic obstructive pulmonary disease, unspecified: Secondary | ICD-10-CM | POA: Diagnosis not present

## 2016-05-21 DIAGNOSIS — Z79899 Other long term (current) drug therapy: Secondary | ICD-10-CM | POA: Diagnosis not present

## 2016-05-21 DIAGNOSIS — J45909 Unspecified asthma, uncomplicated: Secondary | ICD-10-CM | POA: Insufficient documentation

## 2016-05-21 DIAGNOSIS — I10 Essential (primary) hypertension: Secondary | ICD-10-CM | POA: Diagnosis not present

## 2016-05-21 DIAGNOSIS — T43615A Adverse effect of caffeine, initial encounter: Secondary | ICD-10-CM | POA: Diagnosis not present

## 2016-05-21 MED ORDER — PREDNISONE 20 MG PO TABS: 40.0000 mg | ORAL_TABLET | Freq: Every day | ORAL | 0 refills | Status: DC

## 2016-05-21 MED ORDER — EPINEPHRINE 0.3 MG/0.3ML IJ SOAJ: 0.3000 mg | Freq: Once | INTRAMUSCULAR | 0 refills | Status: AC

## 2016-05-21 MED ORDER — PREDNISONE 20 MG PO TABS
40.0000 mg | ORAL_TABLET | Freq: Once | ORAL | Status: AC
  Administered 2016-05-21: 40 mg via ORAL
  Filled 2016-05-21: qty 2

## 2016-05-21 NOTE — ED Provider Notes (Signed)
[facility] Emergency Department Provider Note   ____________________________________________   First MD Initiated Contact with Patient 05/21/16 1348     (approximate)  I have reviewed the triage vital signs and the nursing notes.   HISTORY  Chief Complaint Allergic Reaction    HPI Summer Murphy is a 61 y.o. female here for evaluation of allergic reaction  Patient reports she is allergic to know, she had a vanilla coffee creamer yesterday and immediately developed itching and redness over her face and arms. She's been taking Benadryl, 50 mg every 6 hours and it helps, but she continues to have mild itching and a scratchy feeling in her throat. Denies any throat feels swollen. No trouble breathing. She does have a history of asthma, and she reports sometimes her allergies will make that worse, and she felt like she is wheezing some yesterday but this improved.  No chest pain. No facial swelling. Reports some slight redness around her cheeks, which is improving. Reports that when this happens the past she's had to have steroid to help keep the symptoms away which has been effective, and she also had an epinephrine pen prescription but is expired.  She has not had these epinephrine. No nausea vomiting   Past Medical History:  Diagnosis Date  . Acid reflux   . Anxiety   . Arrhythmia    treated with meds and has no current problems  . Arthritis    most uncomfortable in knees  . Asthma    uses several inhalers  . Depression   . Diabetes mellitus without complication (HCC)    sugars run low, not high sugars  . Fever blister   . Hematuria   . Hypertension   . Kidney stone    stones, 2nd lithotripsy  . Left flank pain   . Migraine   . Restless leg   . Yeast vaginitis     Patient Active Problem List   Diagnosis Date Noted  . Asthma 01/20/2016  . Severe recurrent major depression without psychotic features ([place]) 09/10/2014  . COPD (chronic  obstructive pulmonary disease) ([place]) 09/10/2014  . Calculi, ureter 04/26/2013  . Corneal graft malfunction 08/17/2012  . Calculus of kidney 04/26/2012  . Renal colic 99991111  . Urge incontinence 04/26/2012  . Diaphragmatic hernia 10/27/2010  . Barrett esophagus 07/07/2010    Past Surgical History:  Procedure Laterality Date  . APPENDECTOMY  1990  . CARDIAC ELECTROPHYSIOLOGY STUDY AND ABLATION    . CHOLECYSTECTOMY  1990  . EXTRACORPOREAL SHOCK WAVE LITHOTRIPSY Left 09/12/2015   Procedure: EXTRACORPOREAL SHOCK WAVE LITHOTRIPSY (ESWL);  Surgeon: Hollice Espy, MD;  Location: [facility] ORS;  Service: Urology;  Laterality: Left;  . HH repair    . JOINT REPLACEMENT Bilateral 2013,2014   total knees  . LAPAROSCOPIC HYSTERECTOMY    . LITHOTRIPSY    . URETEROSCOPY      Prior to Admission medications   Medication Sig Start Date End Date Taking? Authorizing Provider  budesonide-formoterol (SYMBICORT) 160-4.5 MCG/ACT inhaler Inhale 2 puffs into the lungs 2 (two) times daily.    Historical Provider, MD  butalbital-acetaminophen-caffeine (FIORICET, ESGIC) 50-325-40 MG tablet Take 1 tablet by mouth every 4 (four) hours as needed.  02/23/14   Historical Provider, MD  cetirizine (ZYRTEC) 10 MG tablet Take 10 mg by mouth daily.    Historical Provider, MD  cyclobenzaprine (FLEXERIL) 10 MG tablet Take 1 tablet (10 mg total) by mouth at bedtime. 01/22/16 01/21/17  Bettey Costa, MD  diclofenac  sodium (VOLTAREN) 1 % GEL Apply 2 g topically 2 (two) times daily as needed (for pain).     Historical Provider, MD  diltiazem (DILACOR XR) 180 MG 24 hr capsule Take 180 mg by mouth daily.    Historical Provider, MD  diphenhydrAMINE (BENADRYL) 25 MG tablet Take 25 mg by mouth every 6 (six) hours as needed for itching or allergies.     Historical Provider, MD  EPINEPHrine 0.3 mg/0.3 mL IJ SOAJ injection Inject 0.3 mLs (0.3 mg total) into the muscle once. 05/21/16 05/21/16  Delman Kitten, MD  estrogens, conjugated, (PREMARIN)  0.45 MG tablet Take 0.45 mg by mouth every evening. Reported on 09/09/2015    Historical Provider, MD  fluticasone (FLONASE) 50 MCG/ACT nasal spray Place 2 sprays into both nostrils daily.    Historical Provider, MD  gabapentin (NEURONTIN) 400 MG capsule Take 400 mg by mouth 2 (two) times daily.    Historical Provider, MD  Ipratropium-Albuterol (COMBIVENT) 20-100 MCG/ACT AERS respimat Inhale 1 puff into the lungs 4 (four) times daily.    Historical Provider, MD  ipratropium-albuterol (DUONEB) 0.5-2.5 (3) MG/3ML SOLN Take 3 mLs by nebulization every 4 (four) hours as needed (for shortness of breath/wheezing).     Historical Provider, MD  levofloxacin (LEVAQUIN) 750 MG tablet Take 1 tablet (750 mg total) by mouth daily. 01/23/16   Bettey Costa, MD  LORazepam (ATIVAN) 1 MG tablet Take 1 tablet (1 mg total) by mouth 2 (two) times daily as needed for anxiety. 04/18/16   Daymon Larsen, MD  montelukast (SINGULAIR) 10 MG tablet Take 10 mg by mouth at bedtime.    Historical Provider, MD  Olopatadine HCl 0.2 % SOLN Place 1 drop into both eyes at bedtime.    Historical Provider, MD  omeprazole (PRILOSEC) 20 MG capsule Take 20 mg by mouth 2 (two) times daily.    Historical Provider, MD  ondansetron (ZOFRAN ODT) 4 MG disintegrating tablet Take 1 tablet (4 mg total) by mouth every 8 (eight) hours as needed for nausea or vomiting. 09/02/15   Carrie Mew, MD  oxyCODONE-acetaminophen (PERCOCET) 5-325 MG tablet Take 1 tablet by mouth every 6 (six) hours as needed for severe pain. 09/26/15   Larene Beach A McGowan, PA-C  predniSONE (DELTASONE) 20 MG tablet Take 2 tablets (40 mg total) by mouth daily. 05/21/16   Delman Kitten, MD  rOPINIRole (REQUIP) 0.25 MG tablet Take 0.25 mg by mouth 2 (two) times daily.    Historical Provider, MD  tiotropium (SPIRIVA) 18 MCG inhalation capsule Place 18 mcg into inhaler and inhale daily.    Historical Provider, MD  traMADol (ULTRAM) 50 MG tablet Take 50-100 mg by mouth every 6 (six) hours as  needed for moderate pain or severe pain. Reported on 09/12/2015    Historical Provider, MD  traZODone (DESYREL) 100 MG tablet Take 100 mg by mouth at bedtime. Reported on 09/12/2015    Historical Provider, MD  valACYclovir (VALTREX) 500 MG tablet Take 500 mg by mouth 2 (two) times daily as needed (for fever blisters).     Historical Provider, MD    Allergies Librium [chlordiazepoxide]; Nsaids; Vanilla; Aspirin; Azithromycin; Buprenorphine hcl; Chlordiazepoxide hcl; Iron; Morphine and related; Reglan [metoclopramide]; Sulfa antibiotics; and Tolmetin  Family History  Problem Relation Age of Onset  . Prostate cancer Father   . Kidney Stones Father   . Kidney disease Neg Hx     Social History Social History  Substance Use Topics  . Smoking status: Never Smoker  . Smokeless  tobacco: Never Used  . Alcohol use No    Review of Systems Constitutional: No fever/chills Eyes: No visual changes. ENT: No sore throat.Does feel "scratchy" Cardiovascular: Denies chest pain. Respiratory: Denies shortness of breath. Felt a little Gastrointestinal: No abdominal pain.  No nausea, no vomiting.  No diarrhea.  No constipation. Genitourinary: Negative for dysuria. Skin: See history of present illness. No hives Neurological: Negative for headaches.  10-point ROS otherwise negative.  ____________________________________________   PHYSICAL EXAM:  VITAL SIGNS: ED Triage Vitals  Enc Vitals Group     BP 05/21/16 1342 129/64     Pulse Rate 05/21/16 1342 (!) 104     Resp 05/21/16 1342 (!) 24     Temp 05/21/16 1342 98.1 F (36.7 C)     Temp Source 05/21/16 1342 Oral     SpO2 05/21/16 1342 98 %     Weight 05/21/16 1337 171 lb (77.6 kg)     Height 05/21/16 1337 5\' 3"  (1.6 m)     Head Circumference --      Peak Flow --      Pain Score --      Pain Loc --      Pain Edu? --      Excl. in [place]? --     Constitutional: Alert and oriented. Well appearing and in no acute distress. Eyes: Conjunctivae  are normal. PERRL. EOMI. Head: Atraumatic. She does have a very slight erythematous hue over the nasal bridge and cheeks. Nose: No congestion/rhinnorhea. No oral pharyngeal edema. Able swallow well. No stridor . Mouth/Throat: Mucous membranes are moist.  Oropharynx non-erythematous. Neck: No stridor.   Cardiovascular: Normal rate, regular rhythm. Grossly normal heart sounds.  Good peripheral circulation. Respiratory: Normal respiratory effort.  No retractions. Lungs CTAB. Gastrointestinal: Soft and nontender. No distention. No abdominal bruits. No CVA tenderness. Musculoskeletal: No lower extremity tenderness nor edema.  Neurologic:  Normal speech and language. No gross focal neurologic deficits are appreciated. No gait instability. Skin:  Skin is warm, dry and intact. No rash noted. Psychiatric: Mood and affect are normal. Speech and behavior are normal.  ____________________________________________   LABS (all labs ordered are listed, but only abnormal results are displayed)  Labs Reviewed - No data to display ____________________________________________  EKG   ____________________________________________  RADIOLOGY   ____________________________________________   PROCEDURES  Procedure(s) performed: None  Procedures  Critical Care performed: No  ____________________________________________   INITIAL IMPRESSION / ASSESSMENT AND PLAN / ED COURSE  Pertinent labs & imaging results that were available during my care of the patient were reviewed by me and considered in my medical decision making (see chart for details).  Exposure to known allergen. Appears consistent with a mild allergic reaction without any evidence of anaphylaxis. She is alert and oriented, no evidence of edema, angioedema, respiratory involvement or other concerns at this time. Discussed with the patient, she will continue Benadryl every 6 hours the next couple days, add prednisone, and have refilled  her epinephrine injection or prescription.  Return precautions and treatment recommendations and follow-up discussed with the patient who is agreeable with the plan.       ____________________________________________   FINAL CLINICAL IMPRESSION(S) / ED DIAGNOSES  Final diagnoses:  Allergic reaction, initial encounter      NEW MEDICATIONS STARTED DURING THIS VISIT:  New Prescriptions   EPINEPHRINE 0.3 MG/0.3 ML IJ SOAJ INJECTION    Inject 0.3 mLs (0.3 mg total) into the muscle once.   PREDNISONE (DELTASONE) 20 MG TABLET  Take 2 tablets (40 mg total) by mouth daily.     Note:  This document was prepared using Dragon voice recognition software and may include unintentional dictation errors.     Delman Kitten, MD 05/21/16 1410

## 2016-05-21 NOTE — ED Notes (Signed)
ED Provider at bedside. 

## 2016-05-21 NOTE — ED Triage Notes (Signed)
Pt reports allergic reaction to french vanilla creamer yesterday.  It got better per pt but then started getting worse again today.  C/o rash to face/arms.  Feels like hard to breath. C/o itching in throat. Reports anaphylaxis to vanilla but epi pen is out of date. No distress currently.

## 2016-06-12 ENCOUNTER — Ambulatory Visit
Admission: RE | Admit: 2016-06-12 | Discharge: 2016-06-12 | Disposition: A | Discharge status: Home / Self Care | Payer: Medicare Other | Source: Ambulatory Visit | Attending: Nurse Practitioner | Admitting: Nurse Practitioner

## 2016-06-12 ENCOUNTER — Other Ambulatory Visit: Payer: Self-pay | Admitting: Nurse Practitioner

## 2016-06-12 DIAGNOSIS — E559 Vitamin D deficiency, unspecified: Secondary | ICD-10-CM | POA: Diagnosis not present

## 2016-06-12 DIAGNOSIS — T1490XA Injury, unspecified, initial encounter: Secondary | ICD-10-CM

## 2016-06-12 DIAGNOSIS — Z981 Arthrodesis status: Secondary | ICD-10-CM | POA: Insufficient documentation

## 2016-06-12 DIAGNOSIS — M19072 Primary osteoarthritis, left ankle and foot: Secondary | ICD-10-CM | POA: Insufficient documentation

## 2016-06-12 DIAGNOSIS — M79672 Pain in left foot: Secondary | ICD-10-CM | POA: Diagnosis present

## 2016-06-12 DIAGNOSIS — D511 Vitamin B12 deficiency anemia due to selective vitamin B12 malabsorption with proteinuria: Secondary | ICD-10-CM | POA: Diagnosis not present

## 2016-06-12 DIAGNOSIS — T7840XD Allergy, unspecified, subsequent encounter: Secondary | ICD-10-CM | POA: Diagnosis not present

## 2016-07-02 DIAGNOSIS — M19072 Primary osteoarthritis, left ankle and foot: Secondary | ICD-10-CM | POA: Diagnosis not present

## 2016-07-02 DIAGNOSIS — M2570 Osteophyte, unspecified joint: Secondary | ICD-10-CM | POA: Diagnosis not present

## 2016-07-02 DIAGNOSIS — M79672 Pain in left foot: Secondary | ICD-10-CM | POA: Diagnosis not present

## 2016-07-03 ENCOUNTER — Other Ambulatory Visit: Payer: Self-pay | Admitting: Podiatry

## 2016-07-07 DIAGNOSIS — E782 Mixed hyperlipidemia: Secondary | ICD-10-CM | POA: Diagnosis not present

## 2016-07-07 DIAGNOSIS — E559 Vitamin D deficiency, unspecified: Secondary | ICD-10-CM | POA: Diagnosis not present

## 2016-07-07 DIAGNOSIS — I10 Essential (primary) hypertension: Secondary | ICD-10-CM | POA: Diagnosis not present

## 2016-07-07 DIAGNOSIS — R5383 Other fatigue: Secondary | ICD-10-CM | POA: Diagnosis not present

## 2016-07-07 DIAGNOSIS — D509 Iron deficiency anemia, unspecified: Secondary | ICD-10-CM | POA: Diagnosis not present

## 2016-07-20 DIAGNOSIS — G43909 Migraine, unspecified, not intractable, without status migrainosus: Secondary | ICD-10-CM | POA: Diagnosis not present

## 2016-07-20 DIAGNOSIS — R110 Nausea: Secondary | ICD-10-CM | POA: Diagnosis not present

## 2016-07-20 DIAGNOSIS — I10 Essential (primary) hypertension: Secondary | ICD-10-CM | POA: Diagnosis not present

## 2016-07-20 DIAGNOSIS — I447 Left bundle-branch block, unspecified: Secondary | ICD-10-CM | POA: Diagnosis not present

## 2016-07-20 DIAGNOSIS — Z01818 Encounter for other preprocedural examination: Secondary | ICD-10-CM | POA: Diagnosis not present

## 2016-07-23 ENCOUNTER — Other Ambulatory Visit: Payer: Self-pay | Admitting: Nurse Practitioner

## 2016-07-23 DIAGNOSIS — R9431 Abnormal electrocardiogram [ECG] [EKG]: Secondary | ICD-10-CM

## 2016-07-27 ENCOUNTER — Encounter
Admission: RE | Admit: 2016-07-27 | Discharge: 2016-07-27 | Disposition: A | Discharge status: Home / Self Care | Payer: Medicare Other | Source: Ambulatory Visit | Attending: Podiatry | Admitting: Podiatry

## 2016-07-27 DIAGNOSIS — M898X9 Other specified disorders of bone, unspecified site: Secondary | ICD-10-CM | POA: Diagnosis not present

## 2016-07-27 DIAGNOSIS — M19072 Primary osteoarthritis, left ankle and foot: Secondary | ICD-10-CM | POA: Diagnosis not present

## 2016-07-27 DIAGNOSIS — I447 Left bundle-branch block, unspecified: Secondary | ICD-10-CM | POA: Diagnosis not present

## 2016-07-27 DIAGNOSIS — M216X2 Other acquired deformities of left foot: Secondary | ICD-10-CM | POA: Diagnosis not present

## 2016-07-27 DIAGNOSIS — R9439 Abnormal result of other cardiovascular function study: Secondary | ICD-10-CM | POA: Diagnosis not present

## 2016-07-27 DIAGNOSIS — Z01812 Encounter for preprocedural laboratory examination: Secondary | ICD-10-CM | POA: Diagnosis not present

## 2016-07-27 DIAGNOSIS — R9431 Abnormal electrocardiogram [ECG] [EKG]: Secondary | ICD-10-CM | POA: Diagnosis not present

## 2016-07-27 LAB — SURGICAL PCR SCREEN
MRSA, PCR: NEGATIVE
Staphylococcus aureus: NEGATIVE

## 2016-07-27 NOTE — Pre-Procedure Instructions (Signed)
LBBB noted on previous EKG from 09/11/15

## 2016-07-27 NOTE — Patient Instructions (Signed)
Your procedure is scheduled on: Friday 07/31/16 Report to [place]. 2ND FLOOR MEDICAL MALL ENTRANCE. To find out your arrival time please call [phone] between 1PM - 3PM on Thursday 07/30/16.  Remember: Instructions that are not followed completely may result in serious medical risk, up to and including death, or upon the discretion of your surgeon and anesthesiologist your surgery may need to be rescheduled.    __X__ 1. Do not eat food or drink liquids after midnight. No gum chewing or hard candies.     __X__ 2. No Alcohol for 24 hours before or after surgery.   ____ 3. Bring all medications with you on the day of surgery if instructed.    __X__ 4. Notify your doctor if there is any change in your medical condition     (cold, fever, infections).             ___X__5. No smoking within 24 hours of your surgery.     Do not wear jewelry, make-up, hairpins, clips or nail polish.  Do not wear lotions, powders, or perfumes.   Do not shave 48 hours prior to surgery. Men may shave face and neck.  Do not bring valuables to the hospital.    [facility] is not responsible for any belongings or valuables.               Contacts, dentures or bridgework may not be worn into surgery.  Leave your suitcase in the car. After surgery it may be brought to your room.  For patients admitted to the hospital, discharge time is determined by your                treatment team.   Patients discharged the day of surgery will not be allowed to drive home.   Please read over the following fact sheets that you were given:   Pain Booklet and MRSA Information INCENTIVE SPIROMETRY  __X__ Take these medicines the morning of surgery with A SIP OF WATER:    1. BUSPIRONE  2. DILTIAZEM  3. GABAPENTINE  4. OMEPRAZOLE  5. LORAZEPAM  6. CETIRIZINE OF NEEDED  ____ Fleet Enema (as directed)   __X__ Use CHG Soap as directed  __X__ Use inhalers on the day of surgery AND NEBULIZER THE MORNING OF SURGERY  ____  Stop metformin 2 days prior to surgery    ____ Take 1/2 of usual insulin dose the night before surgery and none on the morning of surgery.   ____ Stop Coumadin/Plavix/aspirin on   __X__ Stop Anti-inflammatories such as Advil, Aleve, Ibuprofen, Motrin, Naproxen, Naprosyn, Goodies,powder, or aspirin products.  OK to take Tylenol.   ____ Stop supplements until after surgery.    ____ Bring C-Pap to the hospital.

## 2016-07-29 ENCOUNTER — Ambulatory Visit
Admission: RE | Admit: 2016-07-29 | Discharge: 2016-07-29 | Disposition: A | Discharge status: Home / Self Care | Payer: Medicare Other | Source: Ambulatory Visit | Attending: Nurse Practitioner | Admitting: Nurse Practitioner

## 2016-07-29 DIAGNOSIS — R9431 Abnormal electrocardiogram [ECG] [EKG]: Secondary | ICD-10-CM | POA: Diagnosis not present

## 2016-07-29 DIAGNOSIS — Z01812 Encounter for preprocedural laboratory examination: Secondary | ICD-10-CM | POA: Diagnosis not present

## 2016-07-29 DIAGNOSIS — I447 Left bundle-branch block, unspecified: Secondary | ICD-10-CM | POA: Diagnosis not present

## 2016-07-29 DIAGNOSIS — R9439 Abnormal result of other cardiovascular function study: Secondary | ICD-10-CM | POA: Insufficient documentation

## 2016-07-29 LAB — NM MYOCAR MULTI W/SPECT W/WALL MOTION / EF
Estimated workload: 1 METS
Exercise duration (min): 1 min
Exercise duration (sec): 1 s
LV dias vol: 70 mL (ref 46–106)
LV sys vol: 25 mL
MPHR: 159 {beats}/min
Peak HR: 114 {beats}/min
Percent HR: 71 %
Rest HR: 82 {beats}/min
SDS: 0
SRS: 16
SSS: 10
TID: 0.75

## 2016-07-29 MED ORDER — TECHNETIUM TC 99M TETROFOSMIN IV KIT
29.4440 | PACK | Freq: Once | INTRAVENOUS | Status: AC | PRN
  Administered 2016-07-29: 29.444 via INTRAVENOUS

## 2016-07-29 MED ORDER — TECHNETIUM TC 99M TETROFOSMIN IV KIT
13.2400 | PACK | Freq: Once | INTRAVENOUS | Status: AC | PRN
  Administered 2016-07-29: 13.24 via INTRAVENOUS

## 2016-07-29 MED ORDER — REGADENOSON 0.4 MG/5ML IV SOLN
0.4000 mg | Freq: Once | INTRAVENOUS | Status: AC
  Administered 2016-07-29: 0.4 mg via INTRAVENOUS

## 2016-07-30 MED ORDER — CEFAZOLIN SODIUM-DEXTROSE 2-4 GM/100ML-% IV SOLN
2.0000 g | INTRAVENOUS | Status: AC
  Administered 2016-07-31: 2 g via INTRAVENOUS

## 2016-07-30 NOTE — Pre-Procedure Instructions (Addendum)
CALLED AND SPOKE WITH JANIS AT [facility]. NEED STRESS RESULTS FROM 07/29/16 AND CLEARANCE NOTE.  LM FOR CLEARANCE TO BE FAXED TO [phone] [facility]. CHART SENT TO [facility]

## 2016-07-31 ENCOUNTER — Ambulatory Visit: Payer: Medicare Other | Admitting: Anesthesiology

## 2016-07-31 ENCOUNTER — Encounter: Payer: Self-pay | Admitting: *Deleted

## 2016-07-31 ENCOUNTER — Encounter
Admission: RE | Disposition: A | Discharge status: Home / Self Care | Payer: Self-pay | Source: Ambulatory Visit | Attending: Podiatry

## 2016-07-31 ENCOUNTER — Ambulatory Visit
Admission: RE | Admit: 2016-07-31 | Discharge: 2016-07-31 | Disposition: A | Discharge status: Home / Self Care | Payer: Medicare Other | Source: Ambulatory Visit | Attending: Podiatry | Admitting: Podiatry

## 2016-07-31 DIAGNOSIS — M898X7 Other specified disorders of bone, ankle and foot: Secondary | ICD-10-CM | POA: Diagnosis not present

## 2016-07-31 DIAGNOSIS — F419 Anxiety disorder, unspecified: Secondary | ICD-10-CM | POA: Diagnosis not present

## 2016-07-31 DIAGNOSIS — Z79899 Other long term (current) drug therapy: Secondary | ICD-10-CM | POA: Insufficient documentation

## 2016-07-31 DIAGNOSIS — K219 Gastro-esophageal reflux disease without esophagitis: Secondary | ICD-10-CM | POA: Insufficient documentation

## 2016-07-31 DIAGNOSIS — Z7951 Long term (current) use of inhaled steroids: Secondary | ICD-10-CM | POA: Insufficient documentation

## 2016-07-31 DIAGNOSIS — J449 Chronic obstructive pulmonary disease, unspecified: Secondary | ICD-10-CM | POA: Insufficient documentation

## 2016-07-31 DIAGNOSIS — S98132A Complete traumatic amputation of one left lesser toe, initial encounter: Secondary | ICD-10-CM | POA: Diagnosis not present

## 2016-07-31 DIAGNOSIS — Z79890 Hormone replacement therapy (postmenopausal): Secondary | ICD-10-CM | POA: Insufficient documentation

## 2016-07-31 DIAGNOSIS — M898X9 Other specified disorders of bone, unspecified site: Secondary | ICD-10-CM | POA: Diagnosis not present

## 2016-07-31 DIAGNOSIS — M19072 Primary osteoarthritis, left ankle and foot: Secondary | ICD-10-CM | POA: Insufficient documentation

## 2016-07-31 DIAGNOSIS — I10 Essential (primary) hypertension: Secondary | ICD-10-CM | POA: Insufficient documentation

## 2016-07-31 DIAGNOSIS — M216X2 Other acquired deformities of left foot: Secondary | ICD-10-CM | POA: Diagnosis not present

## 2016-07-31 DIAGNOSIS — F329 Major depressive disorder, single episode, unspecified: Secondary | ICD-10-CM | POA: Diagnosis not present

## 2016-07-31 HISTORY — PX: AMPUTATION TOE: SHX6595

## 2016-07-31 HISTORY — PX: EXCISION BONE CYST: SHX6616

## 2016-07-31 SURGERY — AMPUTATION, TOE
Anesthesia: General | Laterality: Left

## 2016-07-31 MED ORDER — LIDOCAINE HCL (PF) 2 % IJ SOLN
INTRAMUSCULAR | Status: AC
  Filled 2016-07-31: qty 2

## 2016-07-31 MED ORDER — GLYCOPYRROLATE 0.2 MG/ML IJ SOLN
INTRAMUSCULAR | Status: AC
  Filled 2016-07-31: qty 1

## 2016-07-31 MED ORDER — ONDANSETRON HCL 4 MG/2ML IJ SOLN
INTRAMUSCULAR | Status: DC | PRN
Start: 2016-07-31 — End: 2016-07-31
  Administered 2016-07-31: 4 mg via INTRAVENOUS

## 2016-07-31 MED ORDER — FENTANYL CITRATE (PF) 100 MCG/2ML IJ SOLN
INTRAMUSCULAR | Status: AC
  Filled 2016-07-31: qty 2

## 2016-07-31 MED ORDER — PROPOFOL 10 MG/ML IV BOLUS
INTRAVENOUS | Status: DC | PRN
  Administered 2016-07-31: 150 mg via INTRAVENOUS

## 2016-07-31 MED ORDER — ONDANSETRON HCL 4 MG/2ML IJ SOLN
INTRAMUSCULAR | Status: AC
  Filled 2016-07-31: qty 2

## 2016-07-31 MED ORDER — MIDAZOLAM HCL 2 MG/2ML IJ SOLN
INTRAMUSCULAR | Status: AC
  Filled 2016-07-31: qty 2

## 2016-07-31 MED ORDER — HYDROCODONE-ACETAMINOPHEN 5-325 MG PO TABS: 1.0000 | ORAL_TABLET | ORAL | 0 refills | Status: DC | PRN

## 2016-07-31 MED ORDER — PHENYLEPHRINE HCL 10 MG/ML IJ SOLN
INTRAMUSCULAR | Status: AC
  Filled 2016-07-31: qty 1

## 2016-07-31 MED ORDER — MIDAZOLAM HCL 5 MG/5ML IJ SOLN
INTRAMUSCULAR | Status: DC | PRN
  Administered 2016-07-31: 2 mg via INTRAVENOUS

## 2016-07-31 MED ORDER — BUPIVACAINE HCL (PF) 0.5 % IJ SOLN
INTRAMUSCULAR | Status: AC
  Filled 2016-07-31: qty 30

## 2016-07-31 MED ORDER — PROPOFOL 10 MG/ML IV BOLUS
INTRAVENOUS | Status: AC
  Filled 2016-07-31: qty 20

## 2016-07-31 MED ORDER — CHLORHEXIDINE GLUCONATE 4 % EX LIQD: 60.0000 mL | Freq: Once | CUTANEOUS | Status: DC

## 2016-07-31 MED ORDER — CEFAZOLIN SODIUM-DEXTROSE 2-4 GM/100ML-% IV SOLN
INTRAVENOUS | Status: AC
  Administered 2016-07-31: 2 g via INTRAVENOUS
  Filled 2016-07-31: qty 100

## 2016-07-31 MED ORDER — LIDOCAINE HCL (PF) 2 % IJ SOLN
INTRAMUSCULAR | Status: DC | PRN
  Administered 2016-07-31: 50 mg

## 2016-07-31 MED ORDER — PHENYLEPHRINE HCL 10 MG/ML IJ SOLN
INTRAMUSCULAR | Status: DC | PRN
  Administered 2016-07-31 (×2): 200 ug via INTRAVENOUS
  Administered 2016-07-31: 100 ug via INTRAVENOUS
  Administered 2016-07-31 (×4): 200 ug via INTRAVENOUS
  Administered 2016-07-31 (×3): 100 ug via INTRAVENOUS

## 2016-07-31 MED ORDER — FENTANYL CITRATE (PF) 100 MCG/2ML IJ SOLN
INTRAMUSCULAR | Status: DC | PRN
  Administered 2016-07-31 (×2): 50 ug via INTRAVENOUS

## 2016-07-31 MED ORDER — GLYCOPYRROLATE 0.2 MG/ML IJ SOLN
INTRAMUSCULAR | Status: DC | PRN
  Administered 2016-07-31: 0.2 mg via INTRAVENOUS

## 2016-07-31 MED ORDER — FENTANYL CITRATE (PF) 100 MCG/2ML IJ SOLN: 25.0000 ug | INTRAMUSCULAR | Status: DC | PRN

## 2016-07-31 MED ORDER — EPHEDRINE SULFATE 50 MG/ML IJ SOLN
INTRAMUSCULAR | Status: AC
  Filled 2016-07-31: qty 1

## 2016-07-31 MED ORDER — DEXAMETHASONE SODIUM PHOSPHATE 10 MG/ML IJ SOLN
INTRAMUSCULAR | Status: AC
  Filled 2016-07-31: qty 1

## 2016-07-31 MED ORDER — BUPIVACAINE-EPINEPHRINE (PF) 0.5% -1:200000 IJ SOLN
INTRAMUSCULAR | Status: AC
  Filled 2016-07-31: qty 30

## 2016-07-31 MED ORDER — BUPIVACAINE HCL 0.5 % IJ SOLN
INTRAMUSCULAR | Status: DC | PRN
  Administered 2016-07-31: 10 mL

## 2016-07-31 MED ORDER — LACTATED RINGERS IV SOLN
INTRAVENOUS | Status: DC
  Administered 2016-07-31: 07:00:00 via INTRAVENOUS

## 2016-07-31 MED ORDER — DEXAMETHASONE SODIUM PHOSPHATE 10 MG/ML IJ SOLN
INTRAMUSCULAR | Status: DC | PRN
  Administered 2016-07-31: 5 mg via INTRAVENOUS

## 2016-07-31 MED ORDER — OXYCODONE HCL 5 MG PO TABS: 5.0000 mg | ORAL_TABLET | Freq: Once | ORAL | Status: DC | PRN

## 2016-07-31 MED ORDER — OXYCODONE HCL 5 MG/5ML PO SOLN: 5.0000 mg | Freq: Once | ORAL | Status: DC | PRN

## 2016-07-31 SURGICAL SUPPLY — 48 items
BANDAGE ACE 4X5 VEL STRL LF (GAUZE/BANDAGES/DRESSINGS) ×2 IMPLANT
BLADE MED AGGRESSIVE (BLADE) ×2 IMPLANT
BLADE OSC/SAGITTAL MD 5.5X18 (BLADE) ×2 IMPLANT
BLADE SURG 15 STRL LF DISP TIS (BLADE) ×2 IMPLANT
BLADE SURG 15 STRL SS (BLADE) ×4
BLADE SURG MINI STRL (BLADE) ×2 IMPLANT
BNDG CMPR 75X21 PLY HI ABS (MISCELLANEOUS) ×1
BNDG ESMARK 4X12 TAN STRL LF (GAUZE/BANDAGES/DRESSINGS) ×2 IMPLANT
BNDG GAUZE 4.5X4.1 6PLY STRL (MISCELLANEOUS) ×2 IMPLANT
CANISTER SUCT 1200ML W/VALVE (MISCELLANEOUS) ×2 IMPLANT
CUFF TOURN 18 STER (MISCELLANEOUS) ×2 IMPLANT
CUFF TOURN DUAL PL 12 NO SLV (MISCELLANEOUS) ×2 IMPLANT
DRAPE FLUOR MINI C-ARM 54X84 (DRAPES) ×2 IMPLANT
DURAPREP 26ML APPLICATOR (WOUND CARE) ×2 IMPLANT
ELECT REM PT RETURN 9FT ADLT (ELECTROSURGICAL) ×2
ELECTRODE REM PT RTRN 9FT ADLT (ELECTROSURGICAL) ×1 IMPLANT
GAUZE PETRO XEROFOAM 1X8 (MISCELLANEOUS) ×2 IMPLANT
GAUZE SPONGE 4X4 12PLY STRL (GAUZE/BANDAGES/DRESSINGS) ×2 IMPLANT
GAUZE STRETCH 2X75IN STRL (MISCELLANEOUS) ×2 IMPLANT
GLOVE BIO SURGEON STRL SZ7.5 (GLOVE) ×2 IMPLANT
GLOVE INDICATOR 8.0 STRL GRN (GLOVE) ×2 IMPLANT
GOWN STRL REUS W/ TWL LRG LVL3 (GOWN DISPOSABLE) ×2 IMPLANT
GOWN STRL REUS W/TWL LRG LVL3 (GOWN DISPOSABLE) ×4
HANDPIECE VERSAJET DEBRIDEMENT (MISCELLANEOUS) ×2 IMPLANT
KIT RM TURNOVER STRD PROC AR (KITS) ×2 IMPLANT
LABEL OR SOLS (LABEL) ×2 IMPLANT
NDL FILTER BLUNT 18X1 1/2 (NEEDLE) ×1 IMPLANT
NDL HYPO 25X1 1.5 SAFETY (NEEDLE) ×2 IMPLANT
NEEDLE FILTER BLUNT 18X 1/2SAF (NEEDLE) ×1
NEEDLE FILTER BLUNT 18X1 1/2 (NEEDLE) ×1 IMPLANT
NEEDLE HYPO 25X1 1.5 SAFETY (NEEDLE) ×4 IMPLANT
NS IRRIG 500ML POUR BTL (IV SOLUTION) ×2 IMPLANT
PACK EXTREMITY ARMC (MISCELLANEOUS) ×2 IMPLANT
SOL .9 NS 3000ML IRR  AL (IV SOLUTION) ×1
SOL .9 NS 3000ML IRR AL (IV SOLUTION) ×1
SOL .9 NS 3000ML IRR UROMATIC (IV SOLUTION) ×1 IMPLANT
SOL PREP PVP 2OZ (MISCELLANEOUS) ×2
SOLUTION PREP PVP 2OZ (MISCELLANEOUS) ×1 IMPLANT
STOCKINETTE STRL 6IN 960660 (GAUZE/BANDAGES/DRESSINGS) ×2 IMPLANT
STRIP CLOSURE SKIN 1/4X4 (GAUZE/BANDAGES/DRESSINGS) ×2 IMPLANT
SUT ETHILON 3-0 FS-10 30 BLK (SUTURE) ×2
SUT ETHILON 4-0 (SUTURE)
SUT ETHILON 4-0 FS2 18XMFL BLK (SUTURE)
SUT VIC AB 4-0 FS2 27 (SUTURE) ×1 IMPLANT
SUTURE EHLN 3-0 FS-10 30 BLK (SUTURE) ×1 IMPLANT
SUTURE ETHLN 4-0 FS2 18XMF BLK (SUTURE) IMPLANT
SWAB DUAL CULTURE TRANS RED ST (MISCELLANEOUS) ×2 IMPLANT
SYRINGE 10CC LL (SYRINGE) ×2 IMPLANT

## 2016-07-31 NOTE — Interval H&P Note (Signed)
History and Physical Interval Note:  07/31/2016 7:03 AM  Summer Murphy  has presented today for surgery, with the diagnosis of EXOSTOSIS/M89.8X9  The various methods of treatment have been discussed with the patient and family. After consideration of risks, benefits and other options for treatment, the patient has consented to  Procedure(s): AMPUTATION TOE/MPJ 2nd toe (Left) EXCISION BONE CYST/exostectomy 28124/left 2nd (Left) as a surgical intervention .  The patient's history has been reviewed, patient examined, no change in status, stable for surgery.  I have reviewed the patient's chart and labs.  Questions were answered to the patient's satisfaction.     Durward Fortes

## 2016-07-31 NOTE — Transfer of Care (Signed)
Immediate Anesthesia Transfer of Care Note  Patient: Summer Murphy  Procedure(s) Performed: Procedure(s): AMPUTATION TOE/MPJ 2nd toe (Left) EXCISION BONE CYST/exostectomy 28124/left 2nd (Left)  Patient Location: PACU  Anesthesia Type:General  Level of Consciousness: sedated  Airway & Oxygen Therapy: Patient Spontanous Breathing and Patient connected to face mask oxygen  Post-op Assessment: Report given to RN and Post -op Vital signs reviewed and stable  Post vital signs: Reviewed  Last Vitals:  Vitals:   07/31/16 0610 07/31/16 0900  BP: 133/84 100/65  Pulse: (!) 105 86  Resp: 16 12  Temp: 36.6 C 36.4 C    Last Pain:  Vitals:   07/31/16 0610  TempSrc: Oral  PainSc: 7          Complications: No apparent anesthesia complications

## 2016-07-31 NOTE — Discharge Instructions (Addendum)
1. Elevate left leg on 2 pillows.  2. Keep the bandage on the left foot clean, dry, and do not remove.  3. Sponge bathe only left lower extremity.  4. Wear surgical shoe on the left foot whenever walking or standing.  5. Take one pain, Norco, every 4 hours if needed for pain.    AMBULATORY SURGERY  DISCHARGE INSTRUCTIONS   1) The drugs that you were given will stay in your system until tomorrow so for the next 24 hours you should not:  A) Drive an automobile B) Make any legal decisions C) Drink any alcoholic beverage   2) You may resume regular meals tomorrow.  Today it is better to start with liquids and gradually work up to solid foods.  You may eat anything you prefer, but it is better to start with liquids, then soup and crackers, and gradually work up to solid foods.   3) Please notify your doctor immediately if you have any unusual bleeding, trouble breathing, redness and pain at the surgery site, drainage, fever, or pain not relieved by medication.    4) Additional Instructions:        Please contact your physician with any problems or Same Day Surgery at [phone], Monday through Friday 6 am to 4 pm, or [place] at [facility] number at [phone].

## 2016-07-31 NOTE — Anesthesia Post-op Follow-up Note (Cosign Needed)
Anesthesia QCDR form completed.        

## 2016-07-31 NOTE — Anesthesia Preprocedure Evaluation (Addendum)
Anesthesia Evaluation  Patient identified by MRN, date of birth, ID band Patient awake    Reviewed: Allergy & Precautions, H&P , NPO status , Patient's Chart, lab work & pertinent test results  History of Anesthesia Complications Negative for: history of anesthetic complications  Airway Mallampati: III  TM Distance: <3 FB Neck ROM: limited    Dental  (+) Chipped, Caps, Missing   Pulmonary neg shortness of breath, asthma , COPD,    Pulmonary exam normal breath sounds clear to auscultation       Cardiovascular Exercise Tolerance: Good hypertension, (-) angina(-) Past MI and (-) DOE Normal cardiovascular exam Rhythm:regular Rate:Normal     Neuro/Psych  Headaches, PSYCHIATRIC DISORDERS Anxiety Depression  Neuromuscular disease    GI/Hepatic Neg liver ROS, GERD  Controlled,  Endo/Other  negative endocrine ROSdiabetes  Renal/GU Renal disease     Musculoskeletal  (+) Arthritis ,   Abdominal   Peds  Hematology negative hematology ROS (+)   Anesthesia Other Findings Patient has headache preOp  Past Medical History: No date: Acid reflux No date: Anxiety No date: Arrhythmia     Comment: treated with meds and has no current problems No date: Arthritis     Comment: most uncomfortable in knees No date: Asthma     Comment: uses several inhalers No date: Depression No date: Diabetes mellitus without complication (HCC)     Comment: sugars run low, not high sugars No date: Fever blister No date: Hematuria No date: Hypertension No date: Kidney stone     Comment: stones, 2nd lithotripsy No date: Left flank pain No date: Migraine No date: Restless leg No date: Yeast vaginitis  Past Surgical History: No date: ABDOMINAL HYSTERECTOMY 1990: APPENDECTOMY No date: BACK SURGERY No date: BREAST SURGERY     Comment: bilateral breast reduction No date: CARDIAC ELECTROPHYSIOLOGY STUDY AND ABLATION 1990: CHOLECYSTECTOMY No  date: CORNEAL TRANSPLANT 09/12/2015: EXTRACORPOREAL SHOCK WAVE LITHOTRIPSY Left     Comment: Procedure: EXTRACORPOREAL SHOCK WAVE               LITHOTRIPSY (ESWL);  Surgeon: Hollice Espy,               MD;  Location: [facility] ORS;  Service: Urology;                Laterality: Left; No date: FRACTURE SURGERY     Comment: left foot No date: [facility] repair 2013,2014: JOINT REPLACEMENT Bilateral     Comment: total knees No date: LAPAROSCOPIC HYSTERECTOMY No date: LITHOTRIPSY No date: TONSILLECTOMY No date: URETEROSCOPY     Reproductive/Obstetrics negative OB ROS                            Anesthesia Physical Anesthesia Plan  ASA: III  Anesthesia Plan: General   Post-op Pain Management:    Induction: Intravenous  Airway Management Planned: LMA  Additional Equipment:   Intra-op Plan:   Post-operative Plan: Extubation in OR  Informed Consent: I have reviewed the patients History and Physical, chart, labs and discussed the procedure including the risks, benefits and alternatives for the proposed anesthesia with the patient or authorized representative who has indicated his/her understanding and acceptance.   Dental Advisory Given  Plan Discussed with: Anesthesiologist, CRNA and Surgeon  Anesthesia Plan Comments: (Patient consented for risks of anesthesia including but not limited to:  - adverse reactions to medications - damage to teeth, lips or other oral mucosa - sore throat or hoarseness -  Damage to heart, brain, lungs or loss of life  Patient voiced understanding.)        Anesthesia Quick Evaluation

## 2016-07-31 NOTE — H&P (Signed)
Medical history and physical reviewed in the chart. No interval change. Patient stable for surgery

## 2016-07-31 NOTE — Anesthesia Postprocedure Evaluation (Signed)
Anesthesia Post Note  Patient: Summer Murphy  Procedure(s) Performed: Procedure(s) (LRB): AMPUTATION TOE/MPJ 2nd toe (Left) EXCISION BONE CYST/exostectomy 28124/left 2nd (Left)  Patient location during evaluation: PACU Anesthesia Type: General Level of consciousness: awake and alert Pain management: pain level controlled Vital Signs Assessment: post-procedure vital signs reviewed and stable Respiratory status: spontaneous breathing, nonlabored ventilation, respiratory function stable and patient connected to nasal cannula oxygen Cardiovascular status: blood pressure returned to baseline and stable Postop Assessment: no signs of nausea or vomiting Anesthetic complications: no     Last Vitals:  Vitals:   07/31/16 0948 07/31/16 0955  BP: 123/65 127/64  Pulse: 98   Resp: 17 18  Temp: 36.6 C     Last Pain:  Vitals:   07/31/16 0900  TempSrc:   PainSc: Asleep                 Precious Haws Piscitello

## 2016-07-31 NOTE — Op Note (Signed)
Date of operation: 07/31/2016.  Surgeon: Durward Fortes DPM.  Preoperative diagnosis: 1 severe arthritis left second toe and metatarsophalangeal joint. 2. Exostosis left midfoot.  Postoperative diagnosis: Same.  Procedures: 1. Exostectomy left midfoot. 2. Amputation left second toe.  Anesthesia: LMA with local.  Hemostasis: Pneumatic tourniquet left ankle 250 mmHg.  Estimated blood loss: Less than 5 cc.  Pathology: Left second toe.  Implants: None.  Complications: None apparent.  Operative indications: This is a 61 year old female with a history of multiple surgeries on her left second toe with residual and continued significant arthritis. Also relates a very painful bony prominence on the top of her left foot. Discussed with the patient treatment options and she elects for amputation of the left second toe due to the severe arthritis as well as exostectomy of the midfoot.  Operative procedure: Patient was taken to the operating room and placed on the table in the supine position. Following satisfactory LMA anesthesia the left foot was anesthetized with 10 cc of 0.5% bupivacaine plain around the midfoot and second metatarsal area. A pneumatic tourniquet was applied at the level of the left ankle and foot was prepped and draped in the usual sterile fashion. The foot was exsanguinated and the tourniquet inflated to 250 mmHg.   Attention was then directed to the dorsal aspect of the left midfoot where an approximate 2.5 cm lazy S incision was made coursing over the dorsal bony prominence in the mid foot. Incision was deepened via sharp and blunt dissection down to the level of the bone where a linear periosteal incision was made. Capsular and periosteal tissues reflected off of the first metatarsal cuneiform joint area revealing a significant bony dorsal prominence. There also appeared to be a small loose fragment on the lateral aspect of the joint. This was removed using a ronguer and the bony  prominence was then resected using a curved osteotome. The raw bony edges were rasped smooth. Wound was flushed with copious amounts of sterile saline. Intraoperative FluoroScan views revealed good reduction of the bony exostosis. The wound was again flushed with sterile saline and closed using 4-0 Vicryl running suture for all layers from periosteal and capsular closure through deep and superficial subcutaneous as well as skin closure.   Attention was then directed to the distal aspect of the left foot where an elliptical incision was made coursing dorsal to plantar around the medial and lateral base of the second toe. Incision was carried sharply down to the level of bone and periosteal dissection carried back to the level of the metatarsophalangeal joint. Severe arthritic changes were noted at the second metatarsal phalangeal joint with a dorsal fragment. The toe was disarticulated using a Beaver blade and removed in total I/O. Some dorsal prominence was resected using a ronguer off of the second metatarsal head and the second metatarsal head was rasped using a power rasp. The wound was flushed with copious amounts of sterile saline. The incision was then closed using 4-0 Vicryl simple interrupted suture, 4-0 nylon vertical mattress suture and 4-0 nylon simple interrupted sutures. Tincture of benzoin and Steri-Strips applied to the midfoot incision followed by Xeroform to both incisions. This was followed by 4 x 4's Kling and a Kerlix. Tourniquet was released and blood flow noted to return to medially to the left foot and all digits. Ace wrap was applied for compression. Patient was awakened and transported to the PACU with vital signs stable and in good condition.

## 2016-07-31 NOTE — Anesthesia Procedure Notes (Signed)
Procedure Name: LMA Insertion Performed by: Sael Furches Pre-anesthesia Checklist: Patient identified, Patient being monitored, Timeout performed, Emergency Drugs available and Suction available Patient Re-evaluated:Patient Re-evaluated prior to inductionOxygen Delivery Method: Circle system utilized Preoxygenation: Pre-oxygenation with 100% oxygen Intubation Type: IV induction Ventilation: Mask ventilation without difficulty LMA: LMA inserted LMA Size: 3.5 Tube type: Oral Number of attempts: 1 Placement Confirmation: positive ETCO2 and breath sounds checked- equal and bilateral Tube secured with: Tape Dental Injury: Teeth and Oropharynx as per pre-operative assessment        

## 2016-08-04 LAB — SURGICAL PATHOLOGY

## 2016-09-07 DIAGNOSIS — B351 Tinea unguium: Secondary | ICD-10-CM | POA: Diagnosis not present

## 2016-09-07 DIAGNOSIS — M79675 Pain in left toe(s): Secondary | ICD-10-CM | POA: Diagnosis not present

## 2016-09-07 DIAGNOSIS — M79674 Pain in right toe(s): Secondary | ICD-10-CM | POA: Diagnosis not present

## 2016-10-06 DIAGNOSIS — J301 Allergic rhinitis due to pollen: Secondary | ICD-10-CM | POA: Diagnosis not present

## 2016-10-06 DIAGNOSIS — H6503 Acute serous otitis media, bilateral: Secondary | ICD-10-CM | POA: Diagnosis not present

## 2016-10-06 DIAGNOSIS — H8143 Vertigo of central origin, bilateral: Secondary | ICD-10-CM | POA: Diagnosis not present

## 2016-10-21 DIAGNOSIS — I10 Essential (primary) hypertension: Secondary | ICD-10-CM | POA: Diagnosis not present

## 2016-10-21 DIAGNOSIS — D511 Vitamin B12 deficiency anemia due to selective vitamin B12 malabsorption with proteinuria: Secondary | ICD-10-CM | POA: Diagnosis not present

## 2016-10-21 DIAGNOSIS — E559 Vitamin D deficiency, unspecified: Secondary | ICD-10-CM | POA: Diagnosis not present

## 2016-10-21 DIAGNOSIS — Z0001 Encounter for general adult medical examination with abnormal findings: Secondary | ICD-10-CM | POA: Diagnosis not present

## 2016-10-21 DIAGNOSIS — J449 Chronic obstructive pulmonary disease, unspecified: Secondary | ICD-10-CM | POA: Diagnosis not present

## 2016-11-19 DIAGNOSIS — J301 Allergic rhinitis due to pollen: Secondary | ICD-10-CM | POA: Diagnosis not present

## 2016-11-19 DIAGNOSIS — I10 Essential (primary) hypertension: Secondary | ICD-10-CM | POA: Diagnosis not present

## 2016-11-19 DIAGNOSIS — J4541 Moderate persistent asthma with (acute) exacerbation: Secondary | ICD-10-CM | POA: Diagnosis not present

## 2016-11-19 DIAGNOSIS — J069 Acute upper respiratory infection, unspecified: Secondary | ICD-10-CM | POA: Diagnosis not present

## 2016-11-19 DIAGNOSIS — R062 Wheezing: Secondary | ICD-10-CM | POA: Diagnosis not present

## 2016-11-26 DIAGNOSIS — J4541 Moderate persistent asthma with (acute) exacerbation: Secondary | ICD-10-CM | POA: Diagnosis not present

## 2016-11-26 DIAGNOSIS — R062 Wheezing: Secondary | ICD-10-CM | POA: Diagnosis not present

## 2016-11-26 DIAGNOSIS — M542 Cervicalgia: Secondary | ICD-10-CM | POA: Diagnosis not present

## 2017-01-01 ENCOUNTER — Encounter: Payer: Self-pay | Admitting: *Deleted

## 2017-01-01 DIAGNOSIS — Z86010 Personal history of colonic polyps: Secondary | ICD-10-CM | POA: Diagnosis not present

## 2017-01-01 DIAGNOSIS — R1310 Dysphagia, unspecified: Secondary | ICD-10-CM | POA: Diagnosis not present

## 2017-01-01 DIAGNOSIS — K219 Gastro-esophageal reflux disease without esophagitis: Secondary | ICD-10-CM | POA: Diagnosis not present

## 2017-01-04 ENCOUNTER — Ambulatory Visit
Admission: RE | Admit: 2017-01-04 | Discharge: 2017-01-04 | Disposition: A | Discharge status: Home / Self Care | Payer: Medicare Other | Source: Ambulatory Visit | Attending: Unknown Physician Specialty | Admitting: Unknown Physician Specialty

## 2017-01-04 ENCOUNTER — Ambulatory Visit: Payer: Medicare Other | Admitting: Anesthesiology

## 2017-01-04 ENCOUNTER — Encounter
Admission: RE | Disposition: A | Discharge status: Home / Self Care | Payer: Self-pay | Source: Ambulatory Visit | Attending: Unknown Physician Specialty

## 2017-01-04 DIAGNOSIS — Z79899 Other long term (current) drug therapy: Secondary | ICD-10-CM | POA: Diagnosis not present

## 2017-01-04 DIAGNOSIS — Z888 Allergy status to other drugs, medicaments and biological substances status: Secondary | ICD-10-CM | POA: Diagnosis not present

## 2017-01-04 DIAGNOSIS — F419 Anxiety disorder, unspecified: Secondary | ICD-10-CM | POA: Diagnosis not present

## 2017-01-04 DIAGNOSIS — K3184 Gastroparesis: Secondary | ICD-10-CM | POA: Diagnosis not present

## 2017-01-04 DIAGNOSIS — Z79891 Long term (current) use of opiate analgesic: Secondary | ICD-10-CM | POA: Insufficient documentation

## 2017-01-04 DIAGNOSIS — Z8719 Personal history of other diseases of the digestive system: Secondary | ICD-10-CM | POA: Insufficient documentation

## 2017-01-04 DIAGNOSIS — R12 Heartburn: Secondary | ICD-10-CM | POA: Diagnosis not present

## 2017-01-04 DIAGNOSIS — G2581 Restless legs syndrome: Secondary | ICD-10-CM | POA: Insufficient documentation

## 2017-01-04 DIAGNOSIS — Z538 Procedure and treatment not carried out for other reasons: Secondary | ICD-10-CM | POA: Diagnosis not present

## 2017-01-04 DIAGNOSIS — Z882 Allergy status to sulfonamides status: Secondary | ICD-10-CM | POA: Insufficient documentation

## 2017-01-04 DIAGNOSIS — E119 Type 2 diabetes mellitus without complications: Secondary | ICD-10-CM | POA: Diagnosis not present

## 2017-01-04 DIAGNOSIS — R1310 Dysphagia, unspecified: Secondary | ICD-10-CM | POA: Diagnosis not present

## 2017-01-04 DIAGNOSIS — J45909 Unspecified asthma, uncomplicated: Secondary | ICD-10-CM | POA: Insufficient documentation

## 2017-01-04 DIAGNOSIS — F329 Major depressive disorder, single episode, unspecified: Secondary | ICD-10-CM | POA: Insufficient documentation

## 2017-01-04 DIAGNOSIS — I10 Essential (primary) hypertension: Secondary | ICD-10-CM | POA: Insufficient documentation

## 2017-01-04 DIAGNOSIS — Z86010 Personal history of colonic polyps: Secondary | ICD-10-CM | POA: Diagnosis not present

## 2017-01-04 DIAGNOSIS — K210 Gastro-esophageal reflux disease with esophagitis: Secondary | ICD-10-CM | POA: Diagnosis not present

## 2017-01-04 DIAGNOSIS — M1990 Unspecified osteoarthritis, unspecified site: Secondary | ICD-10-CM | POA: Diagnosis not present

## 2017-01-04 DIAGNOSIS — K2950 Unspecified chronic gastritis without bleeding: Secondary | ICD-10-CM | POA: Diagnosis not present

## 2017-01-04 HISTORY — PX: COLONOSCOPY WITH PROPOFOL: SHX5780

## 2017-01-04 HISTORY — PX: ESOPHAGOGASTRODUODENOSCOPY (EGD) WITH PROPOFOL: SHX5813

## 2017-01-04 LAB — GLUCOSE, CAPILLARY: Glucose-Capillary: 103 mg/dL — ABNORMAL HIGH (ref 65–99)

## 2017-01-04 SURGERY — COLONOSCOPY WITH PROPOFOL
Anesthesia: General

## 2017-01-04 MED ORDER — SODIUM CHLORIDE 0.9 % IV SOLN
INTRAVENOUS | Status: DC
  Administered 2017-01-04: 1000 mL via INTRAVENOUS

## 2017-01-04 MED ORDER — MIDAZOLAM HCL 2 MG/2ML IJ SOLN
INTRAMUSCULAR | Status: AC
  Filled 2017-01-04: qty 2

## 2017-01-04 MED ORDER — GENTAMICIN IN SALINE 1.6-0.9 MG/ML-% IV SOLN
80.0000 mg | Freq: Once | INTRAVENOUS | Status: AC
  Administered 2017-01-04: 80 mg via INTRAVENOUS
  Filled 2017-01-04: qty 50

## 2017-01-04 MED ORDER — LIDOCAINE HCL (CARDIAC) 20 MG/ML IV SOLN
INTRAVENOUS | Status: DC | PRN
  Administered 2017-01-04: 60 mg via INTRAVENOUS

## 2017-01-04 MED ORDER — GLYCOPYRROLATE 0.2 MG/ML IJ SOLN
INTRAMUSCULAR | Status: DC | PRN
  Administered 2017-01-04: 0.2 mg via INTRAVENOUS

## 2017-01-04 MED ORDER — SODIUM CHLORIDE 0.9 % IV SOLN: INTRAVENOUS | Status: DC

## 2017-01-04 MED ORDER — FENTANYL CITRATE (PF) 100 MCG/2ML IJ SOLN
INTRAMUSCULAR | Status: AC
  Filled 2017-01-04: qty 2

## 2017-01-04 MED ORDER — PROPOFOL 500 MG/50ML IV EMUL
INTRAVENOUS | Status: AC
  Filled 2017-01-04: qty 50

## 2017-01-04 MED ORDER — PHENYLEPHRINE HCL 10 MG/ML IJ SOLN
INTRAMUSCULAR | Status: DC | PRN
  Administered 2017-01-04 (×2): 100 ug via INTRAVENOUS
  Administered 2017-01-04: 150 ug via INTRAVENOUS

## 2017-01-04 MED ORDER — VANCOMYCIN HCL IN DEXTROSE 750-5 MG/150ML-% IV SOLN
750.0000 mg | Freq: Once | INTRAVENOUS | Status: AC
  Administered 2017-01-04: 750 mg via INTRAVENOUS
  Filled 2017-01-04: qty 150

## 2017-01-04 MED ORDER — PROPOFOL 10 MG/ML IV BOLUS
INTRAVENOUS | Status: DC | PRN
  Administered 2017-01-04: 70 mg via INTRAVENOUS

## 2017-01-04 MED ORDER — FENTANYL CITRATE (PF) 100 MCG/2ML IJ SOLN
INTRAMUSCULAR | Status: DC | PRN
  Administered 2017-01-04 (×2): 25 ug via INTRAVENOUS

## 2017-01-04 MED ORDER — PROPOFOL 500 MG/50ML IV EMUL
INTRAVENOUS | Status: DC | PRN
  Administered 2017-01-04: 140 ug/kg/min via INTRAVENOUS

## 2017-01-04 MED ORDER — MIDAZOLAM HCL 2 MG/2ML IJ SOLN
INTRAMUSCULAR | Status: DC | PRN
  Administered 2017-01-04 (×2): 1 mg via INTRAVENOUS

## 2017-01-04 NOTE — Op Note (Signed)
[facility] Patient Name: Summer Murphy Procedure Date: 01/04/2017 1:40 PM MRN: [id number] Account #: [id number] Date of Birth: Apr 28, 1955 Admit Type: Outpatient Age: 61 Room: [facility] ENDO ROOM 3 Gender: Female Note Status: Finalized Procedure:            Upper GI endoscopy Indications:          Dysphagia, Heartburn Providers:            Manya Silvas, MD Referring MD:         Lavera Guise, MD (Referring MD) Medicines:            Propofol per Anesthesia Complications:        No immediate complications. Procedure:            Pre-Anesthesia Assessment:                       - After reviewing the risks and benefits, the patient                        was deemed in satisfactory condition to undergo the                        procedure.                       After obtaining informed consent, the endoscope was                        passed under direct vision. Throughout the procedure,                        the patient's blood pressure, pulse, and oxygen                        saturations were monitored continuously. The Endoscope                        was introduced through the mouth, and advanced to the                        duodenal bulb. The upper GI endoscopy was somewhat                        difficult due to presence of food. Successful                        completion of the procedure was aided by suctioning out                        liquid from the stomach. The patient tolerated the                        procedure well. Findings:      LA Grade A (one or more mucosal breaks less than 5 mm, not extending       between tops of 2 mucosal folds) esophagitis with no bleeding was found       32 cm from the incisors.      A large amount of food (residue) was found in the gastric body and in       the gastric antrum. as  much liquid as possible was suctioned out of the       stomach. A significant amount of food remained. Diffuse gastritis  seen,       no motility seen in stomach. Duodenum not entered. Impression:           - LA Grade A reflux esophagitis. Rule out Barrett's                        esophagus.                       - A large amount of food (residue) in the stomach.                       - No specimens collected. Recommendation:       - Perform a colonoscopy as previously scheduled. Manya Silvas, MD 01/04/2017 2:20:51 PM This report has been signed electronically. Number of Addenda: 0 Note Initiated On: 01/04/2017 1:40 PM      [facility]

## 2017-01-04 NOTE — Anesthesia Post-op Follow-up Note (Signed)
Anesthesia QCDR form completed.        

## 2017-01-04 NOTE — Anesthesia Preprocedure Evaluation (Signed)
Anesthesia Evaluation  Patient identified by MRN, date of birth, ID band Patient awake    Reviewed: Allergy & Precautions, NPO status , Patient's Chart, lab work & pertinent test results  History of Anesthesia Complications Negative for: history of anesthetic complications  Airway Mallampati: II  TM Distance: >3 FB Neck ROM: Full    Dental  (+) Poor Dentition   Pulmonary asthma , neg sleep apnea, COPD,    breath sounds clear to auscultation- rhonchi (-) wheezing      Cardiovascular hypertension, Pt. on medications (-) CAD, (-) Past MI and (-) Cardiac Stents Dysrhythmias: hx of ablation.  Rhythm:Regular Rate:Normal - Systolic murmurs and - Diastolic murmurs    Neuro/Psych  Headaches, PSYCHIATRIC DISORDERS Anxiety Depression    GI/Hepatic GERD  ,  Endo/Other  diabetes  Renal/GU Renal disease: hx of nephrolithiasis.     Musculoskeletal  (+) Arthritis ,   Abdominal (+) + obese,   Peds  Hematology negative hematology ROS (+)   Anesthesia Other Findings Past Medical History: No date: Acid reflux No date: Anxiety No date: Arrhythmia     Comment:  treated with meds and has no current problems No date: Arthritis     Comment:  most uncomfortable in knees No date: Asthma     Comment:  uses several inhalers No date: Depression No date: Diabetes mellitus without complication (HCC)     Comment:  sugars run low, not high sugars No date: Fever blister No date: Hematuria No date: Hypertension No date: Kidney stone     Comment:  stones, 2nd lithotripsy No date: Left flank pain No date: Migraine No date: Restless leg No date: Yeast vaginitis   Reproductive/Obstetrics                             Anesthesia Physical Anesthesia Plan  ASA: III  Anesthesia Plan: General   Post-op Pain Management:    Induction: Intravenous  PONV Risk Score and Plan: 2 and Propofol infusion  Airway  Management Planned: Natural Airway  Additional Equipment:   Intra-op Plan:   Post-operative Plan:   Informed Consent: I have reviewed the patients History and Physical, chart, labs and discussed the procedure including the risks, benefits and alternatives for the proposed anesthesia with the patient or authorized representative who has indicated his/her understanding and acceptance.   Dental advisory given  Plan Discussed with: CRNA and Anesthesiologist  Anesthesia Plan Comments:         Anesthesia Quick Evaluation

## 2017-01-04 NOTE — Anesthesia Post-op Follow-up Note (Deleted)
Anesthesia QCDR form completed.        

## 2017-01-04 NOTE — Transfer of Care (Signed)
Immediate Anesthesia Transfer of Care Note  Patient: Summer Murphy  Procedure(s) Performed: COLONOSCOPY WITH PROPOFOL (N/A ) ESOPHAGOGASTRODUODENOSCOPY (EGD) WITH PROPOFOL (N/A )  Patient Location: PACU  Anesthesia Type:General  Level of Consciousness: awake, alert  and oriented  Airway & Oxygen Therapy: Patient Spontanous Breathing and Patient connected to nasal cannula oxygen  Post-op Assessment: Report given to RN and Post -op Vital signs reviewed and stable  Post vital signs: Reviewed and stable  Last Vitals:  Vitals:   01/04/17 1430 01/04/17 1431  BP: 95/70 95/70  Pulse: 86 84  Resp:  15  Temp: 37.2 C 37.2 C  SpO2: 100% 97%    Last Pain:  Vitals:   01/04/17 1431  TempSrc: Tympanic         Complications: No apparent anesthesia complications

## 2017-01-04 NOTE — H&P (Signed)
Primary Care Physician:  Lavera Guise, MD Primary Gastroenterologist:  Dr. Vira Agar  Pre-Procedure History & Physical: HPI:  Summer Murphy is a 61 y.o. female is here for an endoscopy and colonoscopy.   Past Medical History:  Diagnosis Date  . Acid reflux   . Anxiety   . Arrhythmia    treated with meds and has no current problems  . Arthritis    most uncomfortable in knees  . Asthma    uses several inhalers  . Depression   . Diabetes mellitus without complication (HCC)    sugars run low, not high sugars  . Fever blister   . Hematuria   . Hypertension   . Kidney stone    stones, 2nd lithotripsy  . Left flank pain   . Migraine   . Restless leg   . Yeast vaginitis     Past Surgical History:  Procedure Laterality Date  . ABDOMINAL HYSTERECTOMY    . AMPUTATION TOE Left 07/31/2016   Procedure: AMPUTATION TOE/MPJ 2nd toe;  Surgeon: Sharlotte Alamo, DPM;  Location: [facility] ORS;  Service: Podiatry;  Laterality: Left;  . APPENDECTOMY  1990  . BACK SURGERY    . BREAST SURGERY     bilateral breast reduction  . CARDIAC ELECTROPHYSIOLOGY STUDY AND ABLATION    . CHOLECYSTECTOMY  1990  . CORNEAL TRANSPLANT    . EXCISION BONE CYST Left 07/31/2016   Procedure: EXCISION BONE CYST/exostectomy 28124/left 2nd;  Surgeon: Sharlotte Alamo, DPM;  Location: [facility] ORS;  Service: Podiatry;  Laterality: Left;  . EXTRACORPOREAL SHOCK WAVE LITHOTRIPSY Left 09/12/2015   Procedure: EXTRACORPOREAL SHOCK WAVE LITHOTRIPSY (ESWL);  Surgeon: Hollice Espy, MD;  Location: [facility] ORS;  Service: Urology;  Laterality: Left;  . FRACTURE SURGERY     left foot  . HH repair    . JOINT REPLACEMENT Bilateral 2013,2014   total knees  . LAPAROSCOPIC HYSTERECTOMY    . LITHOTRIPSY    . TONSILLECTOMY    . URETEROSCOPY      Prior to Admission medications   Medication Sig Start Date End Date Taking? Authorizing Provider  ALPRAZolam (XANAX) 0.25 MG tablet Take 0.25 mg by mouth 2 (two) times daily as needed for anxiety.    Yes [provider]  amitriptyline (ELAVIL) 50 MG tablet Take 50 mg by mouth daily.   Yes [provider]  Brexpiprazole (REXULTI) 4 MG TABS Take 4 mg by mouth at bedtime.    Yes [provider]  budesonide-formoterol (SYMBICORT) 160-4.5 MCG/ACT inhaler Inhale 2 puffs into the lungs 2 (two) times daily as needed (for respiratory issues.).    Yes [provider]  butalbital-acetaminophen-caffeine (FIORICET, ESGIC) 50-325-40 MG tablet Take 1 tablet by mouth every 4 (four) hours as needed (FOR MIGRAINE HEADACHES).  02/23/14  Yes [provider]  cetirizine (ZYRTEC) 10 MG tablet Take 10 mg by mouth daily as needed for allergies.    Yes [provider]  cyclobenzaprine (FLEXERIL) 10 MG tablet Take 1 tablet (10 mg total) by mouth at bedtime. Patient taking differently: Take 10 mg by mouth 3 (three) times daily as needed for muscle spasms.  01/22/16 01/21/17 Yes Mody, Ulice Bold, MD  diclofenac sodium (VOLTAREN) 1 % GEL Apply 2 g topically 2 (two) times daily as needed (for pain).    Yes [provider]  diltiazem (DILACOR XR) 180 MG 24 hr capsule Take 180 mg by mouth daily.   Yes [provider]  diphenhydrAMINE (BENADRYL) 25 MG tablet Take 50 mg  by mouth 2 (two) times daily as needed.    Yes [provider]  DULoxetine (CYMBALTA) 30 MG capsule Take 30 mg by mouth at bedtime.   Yes [provider]  DULoxetine (CYMBALTA) 60 MG capsule Take 60 mg by mouth at bedtime.   Yes [provider]  estrogens, conjugated, (PREMARIN) 0.45 MG tablet Take 0.45 mg by mouth at bedtime. Reported on 09/09/2015   Yes [provider]  gabapentin (NEURONTIN) 400 MG capsule Take 400 mg by mouth 2 (two) times daily.   Yes [provider]  glucose 4 GM chewable tablet Chew 1-2 tablets by mouth 3 (three) times daily as needed for low blood sugar.   Yes [provider]  hydrOXYzine (ATARAX/VISTARIL) 25 MG tablet Take  25 mg by mouth 3 (three) times daily as needed (FOR ITCHING).   Yes [provider]  Ipratropium-Albuterol (COMBIVENT) 20-100 MCG/ACT AERS respimat Inhale 1 puff into the lungs every 6 (six) hours as needed (FOR WHEEZING/SHORTNESS OF BREATH).    Yes [provider]  ipratropium-albuterol (DUONEB) 0.5-2.5 (3) MG/3ML SOLN Take 3 mLs by nebulization every 4 (four) hours as needed (for shortness of breath/wheezing).    Yes [provider]  LORazepam (ATIVAN) 1 MG tablet Take 1 tablet (1 mg total) by mouth 2 (two) times daily as needed for anxiety. Patient taking differently: Take 1 mg by mouth every 8 (eight) hours as needed for anxiety.  04/18/16  Yes Daymon Larsen, MD  montelukast (SINGULAIR) 10 MG tablet Take 10 mg by mouth at bedtime.   Yes [provider]  omeprazole (PRILOSEC) 40 MG capsule Take 40 mg by mouth daily before breakfast.   Yes [provider]  ondansetron (ZOFRAN ODT) 4 MG disintegrating tablet Take 1 tablet (4 mg total) by mouth every 8 (eight) hours as needed for nausea or vomiting. 09/02/15  Yes Carrie Mew, MD  rOPINIRole (REQUIP) 0.5 MG tablet Take 0.5 mg by mouth at bedtime. MAY TAKE 1 TABLET (0.5 MG) IN THE MORNING IF NEEDED FOR RESTLESS LEGS--NORMALLY JUST AT BEDTIME   Yes [provider]  SUMAtriptan (IMITREX) 100 MG tablet Take 100 mg by mouth every 2 (two) hours as needed (for migraine headaches.). May repeat in 1 hours if headache persists or recurs.   Yes [provider]  tiotropium (SPIRIVA) 18 MCG inhalation capsule Place 18 mcg into inhaler and inhale daily as needed (for respiratory issues.).    Yes [provider]  traMADol (ULTRAM) 50 MG tablet Take 50-100 mg by mouth every 6 (six) hours as needed (FOR PAIN.).   Yes [provider]  traZODone (DESYREL) 150 MG tablet Take 150 mg by mouth at bedtime.   Yes [provider]  valACYclovir (VALTREX) 500 MG tablet Take 500 mg by  mouth daily as needed (for fever blisters).   Yes [provider]  Vitamin D, Ergocalciferol, (DRISDOL) 50000 units CAPS capsule Take 50,000 Units by mouth every Monday.   Yes [provider]  HYDROcodone-acetaminophen (NORCO) 5-325 MG tablet Take 1 tablet by mouth every 4 (four) hours as needed for moderate pain. Patient not taking: Reported on 01/04/2017 07/31/16   Sharlotte Alamo, DPM    Allergies as of 01/01/2017 - Review Complete 01/01/2017  Allergen Reaction Noted  . Librium [chlordiazepoxide] Shortness Of Breath 08/28/2014  . Nsaids Other (See Comments) 11/21/2014  . Vanilla Shortness Of Breath 09/10/2014  . Aspirin Hives 08/28/2014  . Azithromycin Other (See Comments) 11/21/2014  . Buprenorphine hcl  Other (See Comments) 09/09/2015  . Chlordiazepoxide hcl  11/21/2014  . Iron Nausea And Vomiting 11/21/2014  . Morphine and related Other (See Comments) 08/28/2014  . Reglan [metoclopramide] Hives 08/28/2014  . Sulfa antibiotics  11/21/2014  . Tolmetin  09/09/2015    Family History  Problem Relation Age of Onset  . Prostate cancer Father   . Kidney Stones Father   . Kidney disease Neg Hx     Social History   Social History  . Marital status: Divorced    Spouse name: N/A  . Number of children: N/A  . Years of education: N/A   Occupational History  . Not on file.   Social History Main Topics  . Smoking status: Never Smoker  . Smokeless tobacco: Never Used  . Alcohol use No  . Drug use: No  . Sexual activity: Not on file   Other Topics Concern  . Not on file   Social History Narrative  . No narrative on file    Review of Systems: See HPI, otherwise negative ROS  Physical Exam: BP 95/70 (BP Location: Left Arm)   Pulse 84   Temp 98.9 F (37.2 C) (Tympanic)   Resp 15   Ht 5' 0.5" (1.537 m)   Wt 78.9 kg (174 lb)   SpO2 97%   BMI 33.42 kg/m  General:   Alert,  pleasant and cooperative in NAD Head:  Normocephalic and atraumatic. Neck:   Supple; no masses or thyromegaly. Lungs:  Clear throughout to auscultation.    Heart:  Regular rate and rhythm. Abdomen:  Soft, nontender and nondistended. Normal bowel sounds, without guarding, and without rebound.   Neurologic:  Alert and  oriented x4;  grossly normal neurologically.  Impression/Plan: Summer Murphy is here for an endoscopy and colonoscopy to be performed for dysphagia and PH colon polyps.  Also history of very short segment Barretts with ablation.  Risks, benefits, limitations, and alternatives regarding  endoscopy and colonoscopy have been reviewed with the patient.  Questions have been answered.  All parties agreeable.   Gaylyn Cheers, MD  01/04/2017, 2:33 PM

## 2017-01-04 NOTE — Op Note (Signed)
[facility] Gastroenterology Patient Name: Summer Murphy Procedure Date: 01/04/2017 1:39 PM MRN: [id number] Account #: [id number] Date of Birth: [birth year] Admit Type: Outpatient Age: 61 Room: [facility] ENDO ROOM 3 Gender: Female Note Status: Finalized Procedure:            Colonoscopy Providers:            Manya Silvas, MD Referring MD:         Lavera Guise, MD (Referring MD) Complications:        No immediate complications. Procedure:            Pre-Anesthesia Assessment:                       - After reviewing the risks and benefits, the patient                        was deemed in satisfactory condition to undergo the                        procedure.                       After obtaining informed consent, the colonoscope was                        passed under direct vision. Throughout the procedure,                        the patient's blood pressure, pulse, and oxygen                        saturations were monitored continuously. The                        Colonoscope was introduced through the anus with the                        intention of advancing to the cecum. The scope was                        advanced to the rectum before the procedure was                        aborted. Medications were given. The colonoscopy was                        aborted due to poor bowel prep with stool present. Findings:      Thick liquid stool poured out after rectal exam done and scope inserted       therefore the procedure was aborted.      A large amount of stool was found in the rectum, precluding       visualization. Impression:           - The procedure was aborted due to poor bowel prep with                        stool present.                       - No specimens collected. Recommendation:       -  The findings and recommendations were discussed with                        the patient's family. Manya Silvas, MD 01/04/2017 2:28:28 PM This report has  been signed electronically. Number of Addenda: 0 Note Initiated On: 01/04/2017 1:39 PM Total Procedure Duration: 0 hours 1 minute 7 seconds       [facility]

## 2017-01-05 ENCOUNTER — Encounter: Payer: Self-pay | Admitting: Unknown Physician Specialty

## 2017-01-05 NOTE — Anesthesia Postprocedure Evaluation (Signed)
Anesthesia Post Note  Patient: Summer Murphy  Procedure(s) Performed: COLONOSCOPY WITH PROPOFOL (N/A ) ESOPHAGOGASTRODUODENOSCOPY (EGD) WITH PROPOFOL (N/A )  Patient location during evaluation: Endoscopy Anesthesia Type: General Level of consciousness: awake and alert and oriented Pain management: pain level controlled Vital Signs Assessment: post-procedure vital signs reviewed and stable Respiratory status: spontaneous breathing, nonlabored ventilation and respiratory function stable Cardiovascular status: blood pressure returned to baseline and stable Postop Assessment: no signs of nausea or vomiting Anesthetic complications: no     Last Vitals:  Vitals:   01/04/17 1450 01/04/17 1500  BP: 100/73 101/84  Pulse: (!) 101 99  Resp: 16 16  Temp:    SpO2: 98% 99%    Last Pain:  Vitals:   01/05/17 0801  TempSrc:   PainSc: 0-No pain                 Graviel Payeur

## 2017-01-18 DIAGNOSIS — R062 Wheezing: Secondary | ICD-10-CM | POA: Diagnosis not present

## 2017-01-18 DIAGNOSIS — J019 Acute sinusitis, unspecified: Secondary | ICD-10-CM | POA: Diagnosis not present

## 2017-02-25 DIAGNOSIS — J301 Allergic rhinitis due to pollen: Secondary | ICD-10-CM | POA: Diagnosis not present

## 2017-02-25 DIAGNOSIS — Z23 Encounter for immunization: Secondary | ICD-10-CM | POA: Diagnosis not present

## 2017-02-25 DIAGNOSIS — M064 Inflammatory polyarthropathy: Secondary | ICD-10-CM | POA: Diagnosis not present

## 2017-02-25 DIAGNOSIS — J4540 Moderate persistent asthma, uncomplicated: Secondary | ICD-10-CM | POA: Diagnosis not present

## 2017-02-25 DIAGNOSIS — G43909 Migraine, unspecified, not intractable, without status migrainosus: Secondary | ICD-10-CM | POA: Diagnosis not present

## 2017-03-09 ENCOUNTER — Other Ambulatory Visit: Payer: Self-pay | Admitting: Internal Medicine

## 2017-03-10 ENCOUNTER — Other Ambulatory Visit: Payer: Self-pay

## 2017-03-12 ENCOUNTER — Other Ambulatory Visit: Payer: Self-pay | Admitting: Nurse Practitioner

## 2017-03-12 MED ORDER — ONDANSETRON HCL 8 MG PO TABS: 8.0000 mg | ORAL_TABLET | Freq: Three times a day (TID) | ORAL | 2 refills | Status: DC | PRN

## 2017-04-13 ENCOUNTER — Other Ambulatory Visit: Payer: Self-pay

## 2017-04-13 MED ORDER — ROPINIROLE HCL 0.5 MG PO TABS: 0.5000 mg | ORAL_TABLET | Freq: Every day | ORAL | 3 refills | Status: DC

## 2017-05-05 ENCOUNTER — Other Ambulatory Visit: Payer: Self-pay

## 2017-05-05 MED ORDER — OXYBUTYNIN CHLORIDE 5 MG PO TABS: 5.0000 mg | ORAL_TABLET | Freq: Two times a day (BID) | ORAL | 0 refills | Status: DC

## 2017-05-05 MED ORDER — CYCLOBENZAPRINE HCL 10 MG PO TABS: 10.0000 mg | ORAL_TABLET | Freq: Two times a day (BID) | ORAL | 1 refills | Status: DC | PRN

## 2017-05-05 MED ORDER — ROPINIROLE HCL 0.5 MG PO TABS: 0.5000 mg | ORAL_TABLET | Freq: Two times a day (BID) | ORAL | 3 refills | Status: DC

## 2017-05-13 ENCOUNTER — Other Ambulatory Visit: Payer: Self-pay

## 2017-05-13 MED ORDER — ROPINIROLE HCL 0.5 MG PO TABS: 0.5000 mg | ORAL_TABLET | Freq: Two times a day (BID) | ORAL | 3 refills | Status: DC

## 2017-05-13 MED ORDER — OXYBUTYNIN CHLORIDE 5 MG PO TABS: 5.0000 mg | ORAL_TABLET | Freq: Two times a day (BID) | ORAL | 3 refills | Status: DC

## 2017-05-24 ENCOUNTER — Other Ambulatory Visit: Payer: Self-pay | Admitting: Internal Medicine

## 2017-05-25 DIAGNOSIS — Z882 Allergy status to sulfonamides status: Secondary | ICD-10-CM | POA: Diagnosis not present

## 2017-05-25 DIAGNOSIS — Z8719 Personal history of other diseases of the digestive system: Secondary | ICD-10-CM | POA: Diagnosis not present

## 2017-05-25 DIAGNOSIS — K449 Diaphragmatic hernia without obstruction or gangrene: Secondary | ICD-10-CM | POA: Diagnosis not present

## 2017-05-25 DIAGNOSIS — J45909 Unspecified asthma, uncomplicated: Secondary | ICD-10-CM | POA: Diagnosis not present

## 2017-05-25 DIAGNOSIS — Z947 Corneal transplant status: Secondary | ICD-10-CM | POA: Diagnosis not present

## 2017-05-25 DIAGNOSIS — K222 Esophageal obstruction: Secondary | ICD-10-CM | POA: Diagnosis not present

## 2017-05-25 DIAGNOSIS — I10 Essential (primary) hypertension: Secondary | ICD-10-CM | POA: Diagnosis not present

## 2017-05-25 DIAGNOSIS — Z09 Encounter for follow-up examination after completed treatment for conditions other than malignant neoplasm: Secondary | ICD-10-CM | POA: Diagnosis not present

## 2017-05-25 DIAGNOSIS — Z9049 Acquired absence of other specified parts of digestive tract: Secondary | ICD-10-CM | POA: Diagnosis not present

## 2017-05-25 DIAGNOSIS — K210 Gastro-esophageal reflux disease with esophagitis: Secondary | ICD-10-CM | POA: Diagnosis not present

## 2017-05-25 DIAGNOSIS — K2270 Barrett's esophagus without dysplasia: Secondary | ICD-10-CM | POA: Diagnosis not present

## 2017-07-01 ENCOUNTER — Encounter: Payer: Self-pay | Admitting: Nurse Practitioner

## 2017-07-01 ENCOUNTER — Ambulatory Visit (INDEPENDENT_AMBULATORY_CARE_PROVIDER_SITE_OTHER): Payer: Medicare Other | Admitting: Nurse Practitioner

## 2017-07-01 VITALS — BP 147/87 | HR 114 | Temp 97.1°F | Resp 16 | Ht 63.0 in | Wt 172.2 lb

## 2017-07-01 DIAGNOSIS — J309 Allergic rhinitis, unspecified: Secondary | ICD-10-CM

## 2017-07-01 DIAGNOSIS — J209 Acute bronchitis, unspecified: Secondary | ICD-10-CM | POA: Insufficient documentation

## 2017-07-01 DIAGNOSIS — I10 Essential (primary) hypertension: Secondary | ICD-10-CM | POA: Diagnosis not present

## 2017-07-01 DIAGNOSIS — F332 Major depressive disorder, recurrent severe without psychotic features: Secondary | ICD-10-CM

## 2017-07-01 DIAGNOSIS — J45909 Unspecified asthma, uncomplicated: Secondary | ICD-10-CM | POA: Diagnosis not present

## 2017-07-01 MED ORDER — AMOXICILLIN-POT CLAVULANATE 875-125 MG PO TABS: 1.0000 | ORAL_TABLET | Freq: Two times a day (BID) | ORAL | 0 refills | Status: DC

## 2017-07-01 MED ORDER — METHYLPREDNISOLONE 4 MG PO TBPK
ORAL_TABLET | ORAL | 0 refills | Status: DC
Start: 2017-07-01 — End: 2018-04-22

## 2017-07-01 NOTE — Progress Notes (Signed)
[facility] [place], [place] [address]  Internal MEDICINE  Office Visit Note  Patient Name: Summer Murphy  [id number]  [id number]  Date of Service: 07/01/2017  Chief Complaint  Patient presents with  . Cough    going on few days   . Wheezing     The patient is here for sick visit. Today she is complaining of wheezing, congestion, and cough. Has been using her nebulizer machine more often, as her rescue inhaler is not helping very much. Symptoms have been going on for about 2 to 3 days. They are getting slightly worse. She denies fever but does have chills.   Pt is here for a sick visit.     Current Medication:  Outpatient Encounter Medications as of 07/01/2017  Medication Sig Note  . ALPRAZolam (XANAX) 0.25 MG tablet Take 0.25 mg by mouth 2 (two) times daily as needed for anxiety.   Marland Kitchen amitriptyline (ELAVIL) 50 MG tablet Take 50 mg by mouth daily.   Marland Kitchen azelastine (ASTELIN) 0.1 % nasal spray once as needed.   . Brexpiprazole (REXULTI) 4 MG TABS Take 4 mg by mouth at bedtime.    . budesonide-formoterol (SYMBICORT) 160-4.5 MCG/ACT inhaler Inhale 2 puffs into the lungs 2 (two) times daily as needed (for respiratory issues.).    Marland Kitchen butalbital-acetaminophen-caffeine (FIORICET, ESGIC) 50-325-40 MG tablet Take 1 tablet by mouth every 4 (four) hours as needed (FOR MIGRAINE HEADACHES).    . cetirizine (ZYRTEC) 10 MG tablet Take 10 mg by mouth daily as needed for allergies.    . cyclobenzaprine (FLEXERIL) 10 MG tablet Take 1 tablet (10 mg total) by mouth 2 (two) times daily as needed for muscle spasms.   . diclofenac sodium (VOLTAREN) 1 % GEL Apply 2 g topically 2 (two) times daily as needed (for pain).    Marland Kitchen diltiazem (DILACOR XR) 180 MG 24 hr capsule Take 180 mg by mouth daily.   . diphenhydrAMINE (BENADRYL) 25 MG tablet Take 50 mg by mouth 2 (two) times daily as needed.    . DULoxetine (CYMBALTA) 30 MG capsule Take 30 mg by mouth at bedtime. 07/22/2016: TOTAL  DOSE=90 MG  . estrogens, conjugated, (PREMARIN) 0.45 MG tablet Take 0.45 mg by mouth at bedtime. Reported on 09/09/2015   . gabapentin (NEURONTIN) 600 MG tablet Take 600 mg by mouth 2 (two) times daily.   Marland Kitchen glucose 4 GM chewable tablet Chew 1-2 tablets by mouth 3 (three) times daily as needed for low blood sugar.   . HYDROcodone-acetaminophen (NORCO) 5-325 MG tablet Take 1 tablet by mouth every 4 (four) hours as needed for moderate pain.   . Ipratropium-Albuterol (COMBIVENT) 20-100 MCG/ACT AERS respimat Inhale 1 puff into the lungs every 6 (six) hours as needed (FOR WHEEZING/SHORTNESS OF BREATH).    Marland Kitchen ipratropium-albuterol (DUONEB) 0.5-2.5 (3) MG/3ML SOLN Take 3 mLs by nebulization every 4 (four) hours as needed (for shortness of breath/wheezing).    . LORazepam (ATIVAN) 1 MG tablet Take 1 tablet (1 mg total) by mouth 2 (two) times daily as needed for anxiety. (Patient taking differently: Take 1 mg by mouth every 8 (eight) hours as needed for anxiety. )   . montelukast (SINGULAIR) 10 MG tablet Take 10 mg by mouth at bedtime.   Marland Kitchen omeprazole (PRILOSEC) 40 MG capsule TAKE ONE CAPSULE EVERY DAY   . ondansetron (ZOFRAN) 8 MG tablet Take 1 tablet (8 mg total) by mouth 3 (three) times daily as needed for nausea or vomiting.   Marland Kitchen  oxybutynin (DITROPAN) 5 MG tablet Take 1 tablet (5 mg total) by mouth 2 (two) times daily.   Marland Kitchen rOPINIRole (REQUIP) 0.5 MG tablet Take 1 tablet (0.5 mg total) by mouth 2 (two) times daily. IF NEEDED FOR RESTLESS LEGS--NORMALLY JUST AT BEDTIME   . SUMAtriptan (IMITREX) 100 MG tablet Take 100 mg by mouth every 2 (two) hours as needed (for migraine headaches.). May repeat in 1 hours if headache persists or recurs.   . tamsulosin (FLOMAX) 0.4 MG CAPS capsule Take by mouth.   . tiotropium (SPIRIVA) 18 MCG inhalation capsule Place 18 mcg into inhaler and inhale daily as needed (for respiratory issues.).    Marland Kitchen traMADol (ULTRAM) 50 MG tablet Take 50-100 mg by mouth every 6 (six) hours as needed  (FOR PAIN.). 07/22/2016: TYPICALLY TAKES 1 TABLET TWICE DAILY  . traZODone (DESYREL) 150 MG tablet Take 150 mg by mouth at bedtime.   . valACYclovir (VALTREX) 500 MG tablet Take 500 mg by mouth daily as needed (for fever blisters).   . Vitamin D, Ergocalciferol, (DRISDOL) 50000 units CAPS capsule Take 50,000 Units by mouth every Monday.   . [DISCONTINUED] ondansetron (ZOFRAN ODT) 4 MG disintegrating tablet Take 1 tablet (4 mg total) by mouth every 8 (eight) hours as needed for nausea or vomiting.   Marland Kitchen amoxicillin-clavulanate (AUGMENTIN) 875-125 MG tablet Take 1 tablet by mouth 2 (two) times daily.   . citalopram (CELEXA) 10 MG tablet Take by mouth.   . hydrOXYzine (ATARAX/VISTARIL) 25 MG tablet Take 25 mg by mouth 3 (three) times daily as needed (FOR ITCHING).   Marland Kitchen methylPREDNISolone (MEDROL) 4 MG TBPK tablet Take by mouth as directed for 6 days   . [DISCONTINUED] DULoxetine (CYMBALTA) 60 MG capsule Take 60 mg by mouth at bedtime. 07/22/2016: TOTAL DOSE=90 MG  . [DISCONTINUED] gabapentin (NEURONTIN) 400 MG capsule Take 400 mg by mouth 2 (two) times daily.    No facility-administered encounter medications on file as of 07/01/2017.       Medical History: Past Medical History:  Diagnosis Date  . Acid reflux   . Anxiety   . Arrhythmia    treated with meds and has no current problems  . Arthritis    most uncomfortable in knees  . Asthma    uses several inhalers  . Depression   . Diabetes mellitus without complication (HCC)    sugars run low, not high sugars  . Fever blister   . Hematuria   . Hypertension   . Kidney stone    stones, 2nd lithotripsy  . Left flank pain   . Migraine   . Restless leg   . Yeast vaginitis      Vital Signs: BP (!) 147/87   Pulse (!) 114   Temp (!) 97.1 F (36.2 C)   Resp 16   Wt 172 lb 3.2 oz (78.1 kg)   SpO2 98%   BMI 33.08 kg/m    Review of Systems  Constitutional: Positive for chills and fatigue. Negative for fever.  HENT: Positive for  congestion, postnasal drip, rhinorrhea, sinus pressure, sinus pain and sore throat.   Eyes: Negative.   Respiratory: Positive for cough, chest tightness, shortness of breath and wheezing.   Gastrointestinal: Positive for nausea. Negative for vomiting.  Endocrine: Negative for cold intolerance, polydipsia, polyphagia and polyuria.  Genitourinary: Negative.   Musculoskeletal: Positive for arthralgias.  Allergic/Immunologic: Positive for environmental allergies.  Neurological: Positive for headaches.  Hematological: Negative.   Psychiatric/Behavioral: Positive for dysphoric mood. The patient is  nervous/anxious.     Physical Exam  Constitutional: She is oriented to person, place, and time. She appears well-developed and well-nourished. No distress.  HENT:  Head: Normocephalic and atraumatic.  Mouth/Throat: Oropharynx is clear and moist. No oropharyngeal exudate.  Eyes: Pupils are equal, round, and reactive to light. EOM are normal.  Neck: Normal range of motion. Neck supple. No JVD present. No tracheal deviation present. No thyromegaly present.  Cardiovascular: Normal rate, regular rhythm and normal heart sounds. Exam reveals no gallop and no friction rub.  No murmur heard. Mild tachycardia  Pulmonary/Chest: Stridor present. She is in respiratory distress. She has wheezes. She has no rales. She exhibits no tenderness.  Congested, non-productive cough present.   Abdominal: Soft. Bowel sounds are normal. There is no tenderness.  Musculoskeletal: Normal range of motion.  Lymphadenopathy:    She has cervical adenopathy.  Neurological: She is alert and oriented to person, place, and time. No cranial nerve deficit.  Skin: Skin is warm and dry. She is not diaphoretic.  Psychiatric: She has a normal mood and affect. Her behavior is normal. Judgment and thought content normal.  Nursing note and vitals reviewed.   Assessment/Plan: 1. Acute bronchitis with asthma Continue to use nebulizer  treatments as needed and as prescribed.  - amoxicillin-clavulanate (AUGMENTIN) 875-125 MG tablet; Take 1 tablet by mouth 2 (two) times daily.  Dispense: 20 tablet; Refill: 0 - methylPREDNISolone (MEDROL) 4 MG TBPK tablet; Take by mouth as directed for 6 days  Dispense: 21 tablet; Refill: 0  2. Allergic rhinitis, unspecified seasonality, unspecified trigger Continue all allergy medication as prescribed   3. Essential hypertension Generally stable. Continue bp medication as prescribed   4. Severe recurrent major depression without psychotic features ([place]) Continue regular visits with psychiatry and counselor as scheduled.   General Counseling: Shakima verbalizes understanding of the findings of todays visit and agrees with plan of treatment. I have discussed any further diagnostic evaluation that may be needed or ordered today. We also reviewed her medications today. she has been encouraged to call the office with any questions or concerns that should arise related to todays visit.   Rest and increase fluids. Continue using OTC medication to control symptoms.   This patient was seen by Leretha Pol, FNP- C in Collaboration with Dr Lavera Guise as a part of collaborative care agreement  Meds ordered this encounter  Medications  . amoxicillin-clavulanate (AUGMENTIN) 875-125 MG tablet    Sig: Take 1 tablet by mouth 2 (two) times daily.    Dispense:  20 tablet    Refill:  0    Order Specific Question:   Supervising Provider    Answer:   Lavera Guise [[id number]]  . methylPREDNISolone (MEDROL) 4 MG TBPK tablet    Sig: Take by mouth as directed for 6 days    Dispense:  21 tablet    Refill:  0    Order Specific Question:   Supervising Provider    Answer:   Lavera Guise [[id number]]    Time spent: 15 Minutes

## 2017-08-03 ENCOUNTER — Ambulatory Visit: Payer: Self-pay | Admitting: Nurse Practitioner

## 2017-08-26 ENCOUNTER — Ambulatory Visit
Admission: RE | Admit: 2017-08-26 | Discharge: 2017-08-26 | Disposition: A | Discharge status: Home / Self Care | Payer: Medicare Other | Source: Ambulatory Visit | Attending: Internal Medicine | Admitting: Internal Medicine

## 2017-08-26 ENCOUNTER — Encounter: Payer: Self-pay | Admitting: Internal Medicine

## 2017-08-26 ENCOUNTER — Telehealth: Payer: Self-pay | Admitting: Internal Medicine

## 2017-08-26 ENCOUNTER — Ambulatory Visit (INDEPENDENT_AMBULATORY_CARE_PROVIDER_SITE_OTHER): Payer: Medicare Other | Admitting: Internal Medicine

## 2017-08-26 VITALS — BP 110/70 | HR 132 | Temp 98.2°F | Resp 16 | Ht 63.0 in | Wt 168.8 lb

## 2017-08-26 DIAGNOSIS — M130 Polyarthritis, unspecified: Secondary | ICD-10-CM

## 2017-08-26 DIAGNOSIS — R05 Cough: Secondary | ICD-10-CM | POA: Diagnosis not present

## 2017-08-26 DIAGNOSIS — K219 Gastro-esophageal reflux disease without esophagitis: Secondary | ICD-10-CM | POA: Diagnosis not present

## 2017-08-26 DIAGNOSIS — J301 Allergic rhinitis due to pollen: Secondary | ICD-10-CM | POA: Diagnosis not present

## 2017-08-26 DIAGNOSIS — J4541 Moderate persistent asthma with (acute) exacerbation: Secondary | ICD-10-CM

## 2017-08-26 MED ORDER — IPRATROPIUM-ALBUTEROL 20-100 MCG/ACT IN AERS: 1.0000 | INHALATION_SPRAY | Freq: Four times a day (QID) | RESPIRATORY_TRACT | 4 refills | Status: DC

## 2017-08-26 MED ORDER — PREDNISONE 10 MG PO TABS: 10.0000 mg | ORAL_TABLET | Freq: Every day | ORAL | 0 refills | Status: DC

## 2017-08-26 MED ORDER — LEVOFLOXACIN 500 MG PO TABS: 500.0000 mg | ORAL_TABLET | Freq: Every day | ORAL | 0 refills | Status: DC

## 2017-08-26 MED ORDER — MONTELUKAST SODIUM 10 MG PO TABS: 10.0000 mg | ORAL_TABLET | Freq: Every day | ORAL | 3 refills | Status: DC

## 2017-08-26 MED ORDER — IPRATROPIUM-ALBUTEROL 0.5-2.5 (3) MG/3ML IN SOLN
3.0000 mL | Freq: Once | RESPIRATORY_TRACT | Status: AC
Start: 2017-08-26 — End: 2017-08-26
  Administered 2017-08-26: 3 mL via RESPIRATORY_TRACT

## 2017-08-26 NOTE — Patient Instructions (Signed)

## 2017-08-26 NOTE — Progress Notes (Signed)
[facility] [place], [place] [address]  Pulmonary Sleep Medicine   Office Visit Note  Patient Name: Summer Murphy DOB: 1955-04-30 MRN [id number]  Date of Service: 08/26/2017  Complaints/HPI: She came into the office today because of increasing cough and wheezing.  Patient was actively wheezing when I saw her.  She states that she was taking her medications as usual.  Has noted increased allergy symptoms and thinks that perhaps this is contributing to her systems.  Spoke with her about possibly going straight to the emergency department.  She was given a nebulizer treatment here in the office which did relieve her symptoms.  Also was asked to make a follow-up appointment with me however she did not  ROS  General: (-) fever, (-) chills, (-) night sweats, (-) weakness Skin: (-) rashes, (-) itching,. Eyes: (-) visual changes, (-) redness, (-) itching. Nose and Sinuses: (-) nasal stuffiness or itchiness, (-) postnasal drip, (-) nosebleeds, (-) sinus trouble. Mouth and Throat: (-) sore throat, (-) hoarseness. Neck: (-) swollen glands, (-) enlarged thyroid, (-) neck pain. Respiratory: + cough, (-) bloody sputum, + shortness of breath, + wheezing. Cardiovascular: - ankle swelling, (-) chest pain. Lymphatic: (-) lymph node enlargement. Neurologic: (-) numbness, (-) tingling. Psychiatric: (-) anxiety, (-) depression   Current Medication: Outpatient Encounter Medications as of 08/26/2017  Medication Sig Note  . ALPRAZolam (XANAX) 0.25 MG tablet Take 0.25 mg by mouth 2 (two) times daily as needed for anxiety.   Marland Kitchen amitriptyline (ELAVIL) 50 MG tablet Take 50 mg by mouth daily.   Marland Kitchen amoxicillin-clavulanate (AUGMENTIN) 875-125 MG tablet Take 1 tablet by mouth 2 (two) times daily.   Marland Kitchen azelastine (ASTELIN) 0.1 % nasal spray once as needed.   . Brexpiprazole (REXULTI) 4 MG TABS Take 4 mg by mouth at bedtime.    . budesonide-formoterol (SYMBICORT) 160-4.5 MCG/ACT inhaler  Inhale 2 puffs into the lungs 2 (two) times daily as needed (for respiratory issues.).    Marland Kitchen butalbital-acetaminophen-caffeine (FIORICET, ESGIC) 50-325-40 MG tablet Take 1 tablet by mouth every 4 (four) hours as needed (FOR MIGRAINE HEADACHES).    . cetirizine (ZYRTEC) 10 MG tablet Take 10 mg by mouth daily as needed for allergies.    . citalopram (CELEXA) 10 MG tablet Take by mouth.   . cyclobenzaprine (FLEXERIL) 10 MG tablet Take 1 tablet (10 mg total) by mouth 2 (two) times daily as needed for muscle spasms.   . diclofenac sodium (VOLTAREN) 1 % GEL Apply 2 g topically 2 (two) times daily as needed (for pain).    Marland Kitchen diltiazem (DILACOR XR) 180 MG 24 hr capsule Take 180 mg by mouth daily.   . diphenhydrAMINE (BENADRYL) 25 MG tablet Take 50 mg by mouth 2 (two) times daily as needed.    . DULoxetine (CYMBALTA) 30 MG capsule Take 30 mg by mouth at bedtime. 07/22/2016: TOTAL DOSE=90 MG  . estrogens, conjugated, (PREMARIN) 0.45 MG tablet Take 0.45 mg by mouth at bedtime. Reported on 09/09/2015   . gabapentin (NEURONTIN) 600 MG tablet Take 600 mg by mouth 2 (two) times daily.   Marland Kitchen glucose 4 GM chewable tablet Chew 1-2 tablets by mouth 3 (three) times daily as needed for low blood sugar.   . HYDROcodone-acetaminophen (NORCO) 5-325 MG tablet Take 1 tablet by mouth every 4 (four) hours as needed for moderate pain.   . hydrOXYzine (ATARAX/VISTARIL) 25 MG tablet Take 25 mg by mouth 3 (three) times daily as needed (FOR ITCHING).   Marland Kitchen  Ipratropium-Albuterol (COMBIVENT) 20-100 MCG/ACT AERS respimat Inhale 1 puff into the lungs every 6 (six) hours as needed (FOR WHEEZING/SHORTNESS OF BREATH).    Marland Kitchen ipratropium-albuterol (DUONEB) 0.5-2.5 (3) MG/3ML SOLN Take 3 mLs by nebulization every 4 (four) hours as needed (for shortness of breath/wheezing).    . LORazepam (ATIVAN) 1 MG tablet Take 1 tablet (1 mg total) by mouth 2 (two) times daily as needed for anxiety. (Patient taking differently: Take 1 mg by mouth every 8 (eight)  hours as needed for anxiety. )   . methylPREDNISolone (MEDROL) 4 MG TBPK tablet Take by mouth as directed for 6 days   . montelukast (SINGULAIR) 10 MG tablet Take 10 mg by mouth at bedtime.   Marland Kitchen omeprazole (PRILOSEC) 40 MG capsule TAKE ONE CAPSULE EVERY DAY   . ondansetron (ZOFRAN) 8 MG tablet Take 1 tablet (8 mg total) by mouth 3 (three) times daily as needed for nausea or vomiting.   Marland Kitchen oxybutynin (DITROPAN) 5 MG tablet Take 1 tablet (5 mg total) by mouth 2 (two) times daily.   Marland Kitchen rOPINIRole (REQUIP) 0.5 MG tablet Take 1 tablet (0.5 mg total) by mouth 2 (two) times daily. IF NEEDED FOR RESTLESS LEGS--NORMALLY JUST AT BEDTIME   . SUMAtriptan (IMITREX) 100 MG tablet Take 100 mg by mouth every 2 (two) hours as needed (for migraine headaches.). May repeat in 1 hours if headache persists or recurs.   . tamsulosin (FLOMAX) 0.4 MG CAPS capsule Take by mouth.   . tiotropium (SPIRIVA) 18 MCG inhalation capsule Place 18 mcg into inhaler and inhale daily as needed (for respiratory issues.).    Marland Kitchen traMADol (ULTRAM) 50 MG tablet Take 50-100 mg by mouth every 6 (six) hours as needed (FOR PAIN.). 07/22/2016: TYPICALLY TAKES 1 TABLET TWICE DAILY  . traZODone (DESYREL) 150 MG tablet Take 150 mg by mouth at bedtime.   . valACYclovir (VALTREX) 500 MG tablet Take 500 mg by mouth daily as needed (for fever blisters).   . Vitamin D, Ergocalciferol, (DRISDOL) 50000 units CAPS capsule Take 50,000 Units by mouth every Monday.    No facility-administered encounter medications on file as of 08/26/2017.     Surgical History: Past Surgical History:  Procedure Laterality Date  . ABDOMINAL HYSTERECTOMY    . AMPUTATION TOE Left 07/31/2016   Procedure: AMPUTATION TOE/MPJ 2nd toe;  Surgeon: Sharlotte Alamo, DPM;  Location: [facility] ORS;  Service: Podiatry;  Laterality: Left;  . APPENDECTOMY  1990  . BACK SURGERY    . BREAST SURGERY     bilateral breast reduction  . CARDIAC ELECTROPHYSIOLOGY STUDY AND ABLATION    . CHOLECYSTECTOMY  1990   . COLONOSCOPY WITH PROPOFOL N/A 01/04/2017   Procedure: COLONOSCOPY WITH PROPOFOL;  Surgeon: Manya Silvas, MD;  Location: [facility] ENDOSCOPY;  Service: Endoscopy;  Laterality: N/A;  . CORNEAL TRANSPLANT    . ESOPHAGOGASTRODUODENOSCOPY (EGD) WITH PROPOFOL N/A 01/04/2017   Procedure: ESOPHAGOGASTRODUODENOSCOPY (EGD) WITH PROPOFOL;  Surgeon: Manya Silvas, MD;  Location: [facility] ENDOSCOPY;  Service: Endoscopy;  Laterality: N/A;  . EXCISION BONE CYST Left 07/31/2016   Procedure: EXCISION BONE CYST/exostectomy 28124/left 2nd;  Surgeon: Sharlotte Alamo, DPM;  Location: [facility] ORS;  Service: Podiatry;  Laterality: Left;  . EXTRACORPOREAL SHOCK WAVE LITHOTRIPSY Left 09/12/2015   Procedure: EXTRACORPOREAL SHOCK WAVE LITHOTRIPSY (ESWL);  Surgeon: Hollice Espy, MD;  Location: [facility] ORS;  Service: Urology;  Laterality: Left;  . FRACTURE SURGERY     left foot  . HH repair    . JOINT REPLACEMENT Bilateral 2013,2014  total knees  . LAPAROSCOPIC HYSTERECTOMY    . LITHOTRIPSY    . TONSILLECTOMY    . URETEROSCOPY      Medical History: Past Medical History:  Diagnosis Date  . Acid reflux   . Anxiety   . Arrhythmia    treated with meds and has no current problems  . Arthritis    most uncomfortable in knees  . Asthma    uses several inhalers  . Depression   . Diabetes mellitus without complication (HCC)    sugars run low, not high sugars  . Fever blister   . Hematuria   . Hypertension   . Kidney stone    stones, 2nd lithotripsy  . Left flank pain   . Migraine   . Restless leg   . Yeast vaginitis     Family History: Family History  Problem Relation Age of Onset  . Prostate cancer Father   . Kidney Stones Father   . Kidney disease Neg Hx     Social History: Social History   Socioeconomic History  . Marital status: Divorced    Spouse name: Not on file  . Number of children: Not on file  . Years of education: Not on file  . Highest education level: Not on file  Occupational History   . Not on file  Social Needs  . Financial resource strain: Not on file  . Food insecurity:    Worry: Not on file    Inability: Not on file  . Transportation needs:    Medical: Not on file    Non-medical: Not on file  Tobacco Use  . Smoking status: Never Smoker  . Smokeless tobacco: Never Used  Substance and Sexual Activity  . Alcohol use: No  . Drug use: No  . Sexual activity: Not on file  Lifestyle  . Physical activity:    Days per week: Not on file    Minutes per session: Not on file  . Stress: Not on file  Relationships  . Social connections:    Talks on phone: Not on file    Gets together: Not on file    Attends religious service: Not on file    Active member of club or organization: Not on file    Attends meetings of clubs or organizations: Not on file    Relationship status: Not on file  . Intimate partner violence:    Fear of current or ex partner: Not on file    Emotionally abused: Not on file    Physically abused: Not on file    Forced sexual activity: Not on file  Other Topics Concern  . Not on file  Social History Narrative  . Not on file    Vital Signs: Blood pressure 110/70, pulse (!) 132, temperature 98.2 F (36.8 C), temperature source Oral, resp. rate 16, height 5\' 3"  (1.6 m), weight 168 lb 12.8 oz (76.6 kg), SpO2 98 %.  Examination: General Appearance: The patient is well-developed, well-nourished, and in no distress. Skin: Gross inspection of skin unremarkable. Head: normocephalic, no gross deformities. Eyes: no gross deformities noted. ENT: ears appear grossly normal no exudates. Neck: Supple. No thyromegaly. No LAD. Respiratory: few rhonchi noted at this time. Cardiovascular: Normal S1 and S2 without murmur or rub. Extremities: No cyanosis. pulses are equal. Neurologic: Alert and oriented. No involuntary movements.  LABS: No results found for this or any previous visit (from the past 2160 hour(s)).  Radiology: No results found.  No  results found.  No results found.    Assessment and Plan: Patient Active Problem List   Diagnosis Date Noted  . Hypertension 07/01/2017  . Acute bronchitis with asthma 07/01/2017  . Allergic rhinitis 07/01/2017  . Asthma 01/20/2016  . Severe recurrent major depression without psychotic features ([place]) 09/10/2014  . COPD (chronic obstructive pulmonary disease) ([place]) 09/10/2014  . Calculi, ureter 04/26/2013  . Corneal graft malfunction 08/17/2012  . Calculus of kidney 04/26/2012  . Renal colic 56/38/9373  . Urge incontinence 04/26/2012  . Diaphragmatic hernia 10/27/2010  . Barrett esophagus 07/07/2010    1. Asthma Exacerbation resp treatment gave her prescription for antibiotics as well as steroids.  She was asked to follow-up in the office and she apparently left without making an appointment.  She was told to actually be seen in the emergency room if her symptoms get worse at any point. 2. Allergic rhinitis continue with present management steroids were also given as noted. 3. GERD stable at this time will monitor 4. Polyarthropathy follow-up with your primary care team  General Counseling: I have discussed the findings of the evaluation and examination with Teriann.  I have also discussed any further diagnostic evaluation thatmay be needed or ordered today. Minahil verbalizes understanding of the findings of todays visit. We also reviewed her medications today and discussed drug interactions and side effects including but not limited excessive drowsiness and altered mental states. We also discussed that there is always a risk not just to her but also people around her. she has been encouraged to call the office with any questions or concerns that should arise related to todays visit.    Time spent: 4min  I have personally obtained a history, examined the patient, evaluated laboratory and imaging results, formulated the assessment and plan and placed orders.    Allyne Gee, MD  [facility] Pulmonary and Critical Care Sleep medicine

## 2017-08-26 NOTE — Telephone Encounter (Signed)
Patient did not check out after appt today. Dr Humphrey Rolls needed pt to get chest x ray and follow up with him in 1 week, I scheduled pt's appt and mailed them to her since she did not check out. Summer Murphy

## 2017-08-31 ENCOUNTER — Other Ambulatory Visit: Payer: Self-pay

## 2017-08-31 MED ORDER — CYCLOBENZAPRINE HCL 10 MG PO TABS: 10.0000 mg | ORAL_TABLET | Freq: Two times a day (BID) | ORAL | 1 refills | Status: DC | PRN

## 2017-09-02 ENCOUNTER — Encounter: Payer: Self-pay | Admitting: Internal Medicine

## 2017-09-02 ENCOUNTER — Ambulatory Visit (INDEPENDENT_AMBULATORY_CARE_PROVIDER_SITE_OTHER): Payer: Medicare Other | Admitting: Internal Medicine

## 2017-09-02 VITALS — BP 120/80 | HR 103 | Resp 16 | Ht 63.0 in | Wt 168.8 lb

## 2017-09-02 DIAGNOSIS — J45909 Unspecified asthma, uncomplicated: Secondary | ICD-10-CM

## 2017-09-02 DIAGNOSIS — J309 Allergic rhinitis, unspecified: Secondary | ICD-10-CM

## 2017-09-02 DIAGNOSIS — R0602 Shortness of breath: Secondary | ICD-10-CM | POA: Diagnosis not present

## 2017-09-02 DIAGNOSIS — J383 Other diseases of vocal cords: Secondary | ICD-10-CM

## 2017-09-02 NOTE — Patient Instructions (Signed)

## 2017-09-02 NOTE — Progress Notes (Signed)
[facility] [place], [place] [address]  Pulmonary Sleep Medicine   Office Visit Note  Patient Name: Summer Murphy DOB: December 04, 1955 MRN [id number]  Date of Service: 09/02/2017  Complaints/HPI:   She is back for follow-up.  She had a chest x-ray done the chest x-ray did not show any infiltrate pneumonia.  Patient time states that she is still having some shortness of breath.  She has what appears to be paradoxical cord movement with Will cord dysfunction.  The patient has cough noted.  She is not bringing up any phlegm.  She has wheezing most audible at the neck level.  She denies having any chest pain at this time.  Her saturations were acceptable and we did a 6 min walk and she did not show any desaturation.  ROS  General: (-) fever, (-) chills, (-) night sweats, (-) weakness Skin: (-) rashes, (-) itching,. Eyes: (-) visual changes, (-) redness, (-) itching. Nose and Sinuses: (-) nasal stuffiness or itchiness, (-) postnasal drip, (-) nosebleeds, (-) sinus trouble. Mouth and Throat: (-) sore throat, (-) hoarseness. Neck: (-) swollen glands, (-) enlarged thyroid, (-) neck pain. Respiratory: + cough, (-) bloody sputum, + shortness of breath, - wheezing. Cardiovascular: - ankle swelling, (-) chest pain. Lymphatic: (-) lymph node enlargement. Neurologic: (-) numbness, (-) tingling. Psychiatric: (-) anxiety, (-) depression   Current Medication: Outpatient Encounter Medications as of 09/02/2017  Medication Sig Note  . ALPRAZolam (XANAX) 0.25 MG tablet Take 0.25 mg by mouth 2 (two) times daily as needed for anxiety.   Marland Kitchen amitriptyline (ELAVIL) 50 MG tablet Take 50 mg by mouth daily.   Marland Kitchen amoxicillin-clavulanate (AUGMENTIN) 875-125 MG tablet Take 1 tablet by mouth 2 (two) times daily.   Marland Kitchen azelastine (ASTELIN) 0.1 % nasal spray once as needed.   . Brexpiprazole (REXULTI) 4 MG TABS Take 4 mg by mouth at bedtime.    . budesonide-formoterol (SYMBICORT) 160-4.5  MCG/ACT inhaler Inhale 2 puffs into the lungs 2 (two) times daily as needed (for respiratory issues.).    Marland Kitchen butalbital-acetaminophen-caffeine (FIORICET, ESGIC) 50-325-40 MG tablet Take 1 tablet by mouth every 4 (four) hours as needed (FOR MIGRAINE HEADACHES).    . cetirizine (ZYRTEC) 10 MG tablet Take 10 mg by mouth daily as needed for allergies.    . citalopram (CELEXA) 10 MG tablet Take by mouth.   . cyclobenzaprine (FLEXERIL) 10 MG tablet Take 1 tablet (10 mg total) by mouth 2 (two) times daily as needed for muscle spasms.   . diclofenac sodium (VOLTAREN) 1 % GEL Apply 2 g topically 2 (two) times daily as needed (for pain).    Marland Kitchen diltiazem (DILACOR XR) 180 MG 24 hr capsule Take 180 mg by mouth daily.   . diphenhydrAMINE (BENADRYL) 25 MG tablet Take 50 mg by mouth 2 (two) times daily as needed.    . DULoxetine (CYMBALTA) 30 MG capsule Take 30 mg by mouth at bedtime. 07/22/2016: TOTAL DOSE=90 MG  . estrogens, conjugated, (PREMARIN) 0.45 MG tablet Take 0.45 mg by mouth at bedtime. Reported on 09/09/2015   . gabapentin (NEURONTIN) 600 MG tablet Take 600 mg by mouth 2 (two) times daily.   Marland Kitchen glucose 4 GM chewable tablet Chew 1-2 tablets by mouth 3 (three) times daily as needed for low blood sugar.   . HYDROcodone-acetaminophen (NORCO) 5-325 MG tablet Take 1 tablet by mouth every 4 (four) hours as needed for moderate pain.   . hydrOXYzine (ATARAX/VISTARIL) 25 MG tablet Take 25 mg by  mouth 3 (three) times daily as needed (FOR ITCHING).   . Ipratropium-Albuterol (COMBIVENT RESPIMAT) 20-100 MCG/ACT AERS respimat Inhale 1 puff into the lungs every 6 (six) hours.   Marland Kitchen ipratropium-albuterol (DUONEB) 0.5-2.5 (3) MG/3ML SOLN Take 3 mLs by nebulization every 4 (four) hours as needed (for shortness of breath/wheezing).    Marland Kitchen levofloxacin (LEVAQUIN) 500 MG tablet Take 1 tablet (500 mg total) by mouth daily.   Marland Kitchen LORazepam (ATIVAN) 1 MG tablet Take 1 tablet (1 mg total) by mouth 2 (two) times daily as needed for anxiety.  (Patient taking differently: Take 1 mg by mouth every 8 (eight) hours as needed for anxiety. )   . methylPREDNISolone (MEDROL) 4 MG TBPK tablet Take by mouth as directed for 6 days   . montelukast (SINGULAIR) 10 MG tablet Take 10 mg by mouth at bedtime.   . montelukast (SINGULAIR) 10 MG tablet Take 1 tablet (10 mg total) by mouth at bedtime.   Marland Kitchen omeprazole (PRILOSEC) 40 MG capsule TAKE ONE CAPSULE EVERY DAY   . ondansetron (ZOFRAN) 8 MG tablet Take 1 tablet (8 mg total) by mouth 3 (three) times daily as needed for nausea or vomiting.   Marland Kitchen oxybutynin (DITROPAN) 5 MG tablet Take 1 tablet (5 mg total) by mouth 2 (two) times daily.   . predniSONE (DELTASONE) 10 MG tablet Take 1 tablet (10 mg total) by mouth daily with breakfast.   . rOPINIRole (REQUIP) 0.5 MG tablet Take 1 tablet (0.5 mg total) by mouth 2 (two) times daily. IF NEEDED FOR RESTLESS LEGS--NORMALLY JUST AT BEDTIME   . SUMAtriptan (IMITREX) 100 MG tablet Take 100 mg by mouth every 2 (two) hours as needed (for migraine headaches.). May repeat in 1 hours if headache persists or recurs.   . tamsulosin (FLOMAX) 0.4 MG CAPS capsule Take by mouth.   . tiotropium (SPIRIVA) 18 MCG inhalation capsule Place 18 mcg into inhaler and inhale daily as needed (for respiratory issues.).    Marland Kitchen traMADol (ULTRAM) 50 MG tablet Take 50-100 mg by mouth every 6 (six) hours as needed (FOR PAIN.). 07/22/2016: TYPICALLY TAKES 1 TABLET TWICE DAILY  . traZODone (DESYREL) 150 MG tablet Take 150 mg by mouth at bedtime.   . valACYclovir (VALTREX) 500 MG tablet Take 500 mg by mouth daily as needed (for fever blisters).   . Vitamin D, Ergocalciferol, (DRISDOL) 50000 units CAPS capsule Take 50,000 Units by mouth every Monday.    No facility-administered encounter medications on file as of 09/02/2017.     Surgical History: Past Surgical History:  Procedure Laterality Date  . ABDOMINAL HYSTERECTOMY    . AMPUTATION TOE Left 07/31/2016   Procedure: AMPUTATION TOE/MPJ 2nd toe;   Surgeon: Sharlotte Alamo, DPM;  Location: [facility] ORS;  Service: Podiatry;  Laterality: Left;  . APPENDECTOMY  1990  . BACK SURGERY    . BREAST SURGERY     bilateral breast reduction  . CARDIAC ELECTROPHYSIOLOGY STUDY AND ABLATION    . CHOLECYSTECTOMY  1990  . COLONOSCOPY WITH PROPOFOL N/A 01/04/2017   Procedure: COLONOSCOPY WITH PROPOFOL;  Surgeon: Manya Silvas, MD;  Location: [facility] ENDOSCOPY;  Service: Endoscopy;  Laterality: N/A;  . CORNEAL TRANSPLANT    . ESOPHAGOGASTRODUODENOSCOPY (EGD) WITH PROPOFOL N/A 01/04/2017   Procedure: ESOPHAGOGASTRODUODENOSCOPY (EGD) WITH PROPOFOL;  Surgeon: Manya Silvas, MD;  Location: [facility] ENDOSCOPY;  Service: Endoscopy;  Laterality: N/A;  . EXCISION BONE CYST Left 07/31/2016   Procedure: EXCISION BONE CYST/exostectomy 28124/left 2nd;  Surgeon: Sharlotte Alamo, DPM;  Location: [facility]  ORS;  Service: Podiatry;  Laterality: Left;  . EXTRACORPOREAL SHOCK WAVE LITHOTRIPSY Left 09/12/2015   Procedure: EXTRACORPOREAL SHOCK WAVE LITHOTRIPSY (ESWL);  Surgeon: Hollice Espy, MD;  Location: [facility] ORS;  Service: Urology;  Laterality: Left;  . FRACTURE SURGERY     left foot  . HH repair    . JOINT REPLACEMENT Bilateral 2013,2014   total knees  . LAPAROSCOPIC HYSTERECTOMY    . LITHOTRIPSY    . TONSILLECTOMY    . URETEROSCOPY      Medical History: Past Medical History:  Diagnosis Date  . Acid reflux   . Anxiety   . Arrhythmia    treated with meds and has no current problems  . Arthritis    most uncomfortable in knees  . Asthma    uses several inhalers  . Depression   . Diabetes mellitus without complication (HCC)    sugars run low, not high sugars  . Fever blister   . Hematuria   . Hypertension   . Kidney stone    stones, 2nd lithotripsy  . Left flank pain   . Migraine   . Restless leg   . Yeast vaginitis     Family History: Family History  Problem Relation Age of Onset  . Prostate cancer Father   . Kidney Stones Father   . Kidney disease Neg Hx      Social History: Social History   Socioeconomic History  . Marital status: Divorced    Spouse name: Not on file  . Number of children: Not on file  . Years of education: Not on file  . Highest education level: Not on file  Occupational History  . Not on file  Social Needs  . Financial resource strain: Not on file  . Food insecurity:    Worry: Not on file    Inability: Not on file  . Transportation needs:    Medical: Not on file    Non-medical: Not on file  Tobacco Use  . Smoking status: Never Smoker  . Smokeless tobacco: Never Used  Substance and Sexual Activity  . Alcohol use: No  . Drug use: No  . Sexual activity: Not on file  Lifestyle  . Physical activity:    Days per week: Not on file    Minutes per session: Not on file  . Stress: Not on file  Relationships  . Social connections:    Talks on phone: Not on file    Gets together: Not on file    Attends religious service: Not on file    Active member of club or organization: Not on file    Attends meetings of clubs or organizations: Not on file    Relationship status: Not on file  . Intimate partner violence:    Fear of current or ex partner: Not on file    Emotionally abused: Not on file    Physically abused: Not on file    Forced sexual activity: Not on file  Other Topics Concern  . Not on file  Social History Narrative  . Not on file    Vital Signs: Blood pressure 120/80, pulse (!) 103, resp. rate 16, height 5\' 3"  (1.6 m), weight 168 lb 12.8 oz (76.6 kg), SpO2 96 %.  Examination: General Appearance: The patient is well-developed, well-nourished, and in no distress. Skin: Gross inspection of skin unremarkable. Head: normocephalic, no gross deformities. Eyes: no gross deformities noted. ENT: ears appear grossly normal no exudates. Neck: Supple. No thyromegaly. No LAD. Respiratory:  Rhonchi mostly at the neck were noted. Cardiovascular: Normal S1 and S2 without murmur or rub. Extremities: No  cyanosis. pulses are equal. Neurologic: Alert and oriented. No involuntary movements.  LABS: No results found for this or any previous visit (from the past 2160 hour(s)).  Radiology: Dg Chest 2 View  Result Date: 08/27/2017 CLINICAL DATA:  Productive cough for 2 days EXAM: CHEST - 2 VIEW COMPARISON:  04/18/2016 FINDINGS: Cardiac shadow is stable. Hiatal hernia is again noted. Postoperative changes at the gastroesophageal junction are seen. Lungs are well aerated bilaterally. No focal infiltrate or sizable effusion is seen. Degenerative changes of the thoracic spine are noted. IMPRESSION: No acute abnormality noted. Electronically Signed   By: Inez Catalina M.D.   On: 08/27/2017 07:33    No results found.  Dg Chest 2 View  Result Date: 08/27/2017 CLINICAL DATA:  Productive cough for 2 days EXAM: CHEST - 2 VIEW COMPARISON:  04/18/2016 FINDINGS: Cardiac shadow is stable. Hiatal hernia is again noted. Postoperative changes at the gastroesophageal junction are seen. Lungs are well aerated bilaterally. No focal infiltrate or sizable effusion is seen. Degenerative changes of the thoracic spine are noted. IMPRESSION: No acute abnormality noted. Electronically Signed   By: Inez Catalina M.D.   On: 08/27/2017 07:33      Assessment and Plan: Patient Active Problem List   Diagnosis Date Noted  . Hypertension 07/01/2017  . Acute bronchitis with asthma 07/01/2017  . Allergic rhinitis 07/01/2017  . Asthma 01/20/2016  . Severe recurrent major depression without psychotic features ([place]) 09/10/2014  . COPD (chronic obstructive pulmonary disease) ([place]) 09/10/2014  . Calculi, ureter 04/26/2013  . Corneal graft malfunction 08/17/2012  . Calculus of kidney 04/26/2012  . Renal colic 99/83/3825  . Urge incontinence 04/26/2012  . Diaphragmatic hernia 10/27/2010  . Barrett esophagus 07/07/2010    1.  vocal cord dysfunction - patient is to be evaluated by ENT this may be her primary problem in the meantime  she will continue with current inhaler regimen.  Also I have encouraged her to use the prednisone that she is on. 2. Chronic obstructive asthma she has had a prolonged course at this time but is treated with antibiotics and also steroids.  She does not want to be admitted to the hospital.  I have told her that if her symptoms worsen that she needs to immediately get to the hospital to be admitted to be further evaluated.  Last chest x-ray was clear there was no evidence of pneumonia or infection at this time. 3. Gastroesophageal reflux could also be a contributing factor to her symptoms.   4. Allergic rhinitis as noted above I have encouraged her to get the Ceretec into resume that she has been off of it.   5. Shortness of breath we did check a 6 min walk and there was no significant desaturation noted  General Counseling: I have discussed the findings of the evaluation and examination with Haivyn.  I have also discussed any further diagnostic evaluation thatmay be needed or ordered today. Lynnzie verbalizes understanding of the findings of todays visit. We also reviewed her medications today and discussed drug interactions and side effects including but not limited excessive drowsiness and altered mental states. We also discussed that there is always a risk not just to her but also people around her. she has been encouraged to call the office with any questions or concerns that should arise related to todays visit.    Time spent: 38min  I  have personally obtained a history, examined the patient, evaluated laboratory and imaging results, formulated the assessment and plan and placed orders.    Pavneet Markwood A Clarita Mcelvain, MD FCCP Pulmonary and Critical Care Sleep medicine 

## 2017-10-03 ENCOUNTER — Encounter: Payer: Self-pay | Admitting: *Deleted

## 2017-10-03 ENCOUNTER — Emergency Department
Admission: EM | Admit: 2017-10-03 | Discharge: 2017-10-03 | Disposition: A | Discharge status: Home / Self Care | Payer: Medicare Other | Attending: Emergency Medicine | Admitting: Emergency Medicine

## 2017-10-03 ENCOUNTER — Other Ambulatory Visit: Payer: Self-pay

## 2017-10-03 DIAGNOSIS — E119 Type 2 diabetes mellitus without complications: Secondary | ICD-10-CM | POA: Diagnosis not present

## 2017-10-03 DIAGNOSIS — L509 Urticaria, unspecified: Secondary | ICD-10-CM | POA: Insufficient documentation

## 2017-10-03 DIAGNOSIS — Z79899 Other long term (current) drug therapy: Secondary | ICD-10-CM | POA: Diagnosis not present

## 2017-10-03 DIAGNOSIS — R21 Rash and other nonspecific skin eruption: Secondary | ICD-10-CM | POA: Diagnosis present

## 2017-10-03 DIAGNOSIS — L299 Pruritus, unspecified: Secondary | ICD-10-CM | POA: Diagnosis not present

## 2017-10-03 DIAGNOSIS — J449 Chronic obstructive pulmonary disease, unspecified: Secondary | ICD-10-CM | POA: Insufficient documentation

## 2017-10-03 DIAGNOSIS — I10 Essential (primary) hypertension: Secondary | ICD-10-CM | POA: Diagnosis not present

## 2017-10-03 MED ORDER — DIPHENHYDRAMINE HCL 25 MG PO CAPS
50.0000 mg | ORAL_CAPSULE | Freq: Once | ORAL | Status: AC
  Administered 2017-10-03: 50 mg via ORAL
  Filled 2017-10-03: qty 2

## 2017-10-03 MED ORDER — CYPROHEPTADINE HCL 4 MG PO TABS: 4.0000 mg | ORAL_TABLET | Freq: Three times a day (TID) | ORAL | 0 refills | Status: DC | PRN

## 2017-10-03 MED ORDER — PREDNISONE 10 MG PO TABS: 10.0000 mg | ORAL_TABLET | Freq: Two times a day (BID) | ORAL | 0 refills | Status: DC

## 2017-10-03 MED ORDER — DEXAMETHASONE SODIUM PHOSPHATE 10 MG/ML IJ SOLN
10.0000 mg | Freq: Once | INTRAMUSCULAR | Status: AC
  Administered 2017-10-03: 10 mg via INTRAMUSCULAR

## 2017-10-03 MED ORDER — FAMOTIDINE 20 MG PO TABS
40.0000 mg | ORAL_TABLET | Freq: Once | ORAL | Status: AC
  Administered 2017-10-03: 40 mg via ORAL
  Filled 2017-10-03: qty 2

## 2017-10-03 MED ORDER — DIPHENHYDRAMINE HCL 50 MG/ML IJ SOLN
50.0000 mg | Freq: Once | INTRAMUSCULAR | Status: DC
  Filled 2017-10-03: qty 1

## 2017-10-03 MED ORDER — DEXAMETHASONE SODIUM PHOSPHATE 10 MG/ML IJ SOLN
10.0000 mg | Freq: Once | INTRAMUSCULAR | Status: DC
  Filled 2017-10-03: qty 1

## 2017-10-03 MED ORDER — FAMOTIDINE IN NACL 20-0.9 MG/50ML-% IV SOLN
20.0000 mg | Freq: Once | INTRAVENOUS | Status: DC
  Filled 2017-10-03: qty 50

## 2017-10-03 MED ORDER — RANITIDINE HCL 150 MG PO TABS: 150.0000 mg | ORAL_TABLET | Freq: Two times a day (BID) | ORAL | 0 refills | Status: DC

## 2017-10-03 NOTE — ED Notes (Addendum)
See triage note  Presents with rash and itching to arms ..sxs' started yesterday  resp even and non labored

## 2017-10-03 NOTE — ED Triage Notes (Signed)
PT reports rash on both her arms and itchiness "everywhere" since yesterday. PT is allergic to vanilla but denies having been in contact with vanilla. No swelling in throat , airway intact. Pt took benadryl at 9:00 this morning without relief.

## 2017-10-03 NOTE — Discharge Instructions (Signed)
Your exam is consistent with urticaria from an unknown cause. Take the prescription meds as directed. Follow-up with your provider for ongoing symptoms.

## 2017-10-04 ENCOUNTER — Other Ambulatory Visit: Payer: Self-pay

## 2017-10-07 NOTE — ED Provider Notes (Addendum)
[facility] Emergency Department Provider Note ____________________________________________  Time seen: 1518  I have reviewed the triage vital signs and the nursing notes.  HISTORY  Chief Complaint  Rash  HPI Summer Murphy is a 62 y.o. female presents to the ED accompanied by her husband, for management of itching. The patient describes itching and a "rash" to her arms, torso and legs. She is unaware of any known triggers, exposures, or allergens. She denies any difficulty with breathing swallowing, or controlling oral secretions. She also denies any swelling of the lips, tongue, or throat. She has been taking Benadryl every 4-6 hours, since onset yesterday, with her last dose this morning at 9 am.  Past Medical History:  Diagnosis Date  . Acid reflux   . Anxiety   . Arrhythmia    treated with meds and has no current problems  . Arthritis    most uncomfortable in knees  . Asthma    uses several inhalers  . Depression   . Diabetes mellitus without complication (HCC)    sugars run low, not high sugars  . Fever blister   . Hematuria   . Hypertension   . Kidney stone    stones, 2nd lithotripsy  . Left flank pain   . Migraine   . Restless leg   . Yeast vaginitis     Patient Active Problem List   Diagnosis Date Noted  . Hypertension 07/01/2017  . Acute bronchitis with asthma 07/01/2017  . Allergic rhinitis 07/01/2017  . Asthma 01/20/2016  . Severe recurrent major depression without psychotic features ([place]) 09/10/2014  . COPD (chronic obstructive pulmonary disease) ([place]) 09/10/2014  . Calculi, ureter 04/26/2013  . Corneal graft malfunction 08/17/2012  . Calculus of kidney 04/26/2012  . Renal colic 63/87/5643  . Urge incontinence 04/26/2012  . Diaphragmatic hernia 10/27/2010  . Barrett esophagus 07/07/2010    Past Surgical History:  Procedure Laterality Date  . ABDOMINAL HYSTERECTOMY    . AMPUTATION TOE Left 07/31/2016   Procedure:  AMPUTATION TOE/MPJ 2nd toe;  Surgeon: Sharlotte Alamo, DPM;  Location: [facility] ORS;  Service: Podiatry;  Laterality: Left;  . APPENDECTOMY  1990  . BACK SURGERY    . BREAST SURGERY     bilateral breast reduction  . CARDIAC ELECTROPHYSIOLOGY STUDY AND ABLATION    . CHOLECYSTECTOMY  1990  . COLONOSCOPY WITH PROPOFOL N/A 01/04/2017   Procedure: COLONOSCOPY WITH PROPOFOL;  Surgeon: Manya Silvas, MD;  Location: [facility] ENDOSCOPY;  Service: Endoscopy;  Laterality: N/A;  . CORNEAL TRANSPLANT    . ESOPHAGOGASTRODUODENOSCOPY (EGD) WITH PROPOFOL N/A 01/04/2017   Procedure: ESOPHAGOGASTRODUODENOSCOPY (EGD) WITH PROPOFOL;  Surgeon: Manya Silvas, MD;  Location: [facility] ENDOSCOPY;  Service: Endoscopy;  Laterality: N/A;  . EXCISION BONE CYST Left 07/31/2016   Procedure: EXCISION BONE CYST/exostectomy 28124/left 2nd;  Surgeon: Sharlotte Alamo, DPM;  Location: [facility] ORS;  Service: Podiatry;  Laterality: Left;  . EXTRACORPOREAL SHOCK WAVE LITHOTRIPSY Left 09/12/2015   Procedure: EXTRACORPOREAL SHOCK WAVE LITHOTRIPSY (ESWL);  Surgeon: Hollice Espy, MD;  Location: [facility] ORS;  Service: Urology;  Laterality: Left;  . FRACTURE SURGERY     left foot  . HH repair    . JOINT REPLACEMENT Bilateral 2013,2014   total knees  . LAPAROSCOPIC HYSTERECTOMY    . LITHOTRIPSY    . TONSILLECTOMY    . URETEROSCOPY      Prior to Admission medications   Medication Sig Start Date End Date Taking? Authorizing Provider  ALPRAZolam Duanne Moron) 0.25 MG tablet Take  0.25 mg by mouth 2 (two) times daily as needed for anxiety.    [provider]  amitriptyline (ELAVIL) 50 MG tablet Take 50 mg by mouth daily.    [provider]  azelastine (ASTELIN) 0.1 % nasal spray once as needed. 08/22/15   [provider]  Brexpiprazole (REXULTI) 4 MG TABS Take 4 mg by mouth at bedtime.     [provider]  budesonide-formoterol (SYMBICORT) 160-4.5 MCG/ACT inhaler Inhale 2 puffs into the lungs 2 (two) times daily as needed  (for respiratory issues.).     [provider]  butalbital-acetaminophen-caffeine (FIORICET, ESGIC) 50-325-40 MG tablet Take 1 tablet by mouth every 4 (four) hours as needed (FOR MIGRAINE HEADACHES).  02/23/14   [provider]  cetirizine (ZYRTEC) 10 MG tablet Take 10 mg by mouth daily as needed for allergies.     [provider]  citalopram (CELEXA) 10 MG tablet Take by mouth.    [provider]  cyclobenzaprine (FLEXERIL) 10 MG tablet Take 1 tablet (10 mg total) by mouth 2 (two) times daily as needed for muscle spasms. 08/31/17   Ronnell Freshwater, NP  cyproheptadine (PERIACTIN) 4 MG tablet Take 1 tablet (4 mg total) by mouth 3 (three) times daily as needed for allergies. 10/03/17   [place] Dirosa, Dannielle Karvonen, PA-C  diclofenac sodium (VOLTAREN) 1 % GEL Apply 2 g topically 2 (two) times daily as needed (for pain).     [provider]  diltiazem (DILACOR XR) 180 MG 24 hr capsule Take 180 mg by mouth daily.    [provider]  diphenhydrAMINE (BENADRYL) 25 MG tablet Take 50 mg by mouth 2 (two) times daily as needed.     [provider]  DULoxetine (CYMBALTA) 30 MG capsule Take 30 mg by mouth at bedtime.    [provider]  estrogens, conjugated, (PREMARIN) 0.45 MG tablet Take 0.45 mg by mouth at bedtime. Reported on 09/09/2015    [provider]  gabapentin (NEURONTIN) 600 MG tablet Take 600 mg by mouth 2 (two) times daily.    [provider]  glucose 4 GM chewable tablet Chew 1-2 tablets by mouth 3 (three) times daily as needed for low blood sugar.    [provider]  HYDROcodone-acetaminophen (NORCO) 5-325 MG tablet Take 1 tablet by mouth every 4 (four) hours as needed for moderate pain. 07/31/16   Sharlotte Alamo, DPM  hydrOXYzine (ATARAX/VISTARIL) 25 MG tablet Take 25 mg by mouth 3 (three) times daily as needed (FOR ITCHING).    [provider]  Ipratropium-Albuterol (COMBIVENT RESPIMAT) 20-100 MCG/ACT  AERS respimat Inhale 1 puff into the lungs every 6 (six) hours. 08/26/17   Allyne Gee, MD  ipratropium-albuterol (DUONEB) 0.5-2.5 (3) MG/3ML SOLN Take 3 mLs by nebulization every 4 (four) hours as needed (for shortness of breath/wheezing).     [provider]  LORazepam (ATIVAN) 1 MG tablet Take 1 tablet (1 mg total) by mouth 2 (two) times daily as needed for anxiety. Patient taking differently: Take 1 mg by mouth every 8 (eight) hours as needed for anxiety.  04/18/16   Daymon Larsen, MD  methylPREDNISolone (MEDROL) 4 MG TBPK tablet Take by mouth as directed for 6 days 07/01/17   Ronnell Freshwater, NP  montelukast (SINGULAIR) 10 MG tablet Take 10 mg by mouth at bedtime.    [provider]  montelukast (SINGULAIR) 10 MG tablet Take 1 tablet (10 mg total) by mouth at bedtime. 08/26/17  Allyne Gee, MD  omeprazole (PRILOSEC) 40 MG capsule TAKE ONE CAPSULE EVERY DAY 05/24/17   Ronnell Freshwater, NP  ondansetron (ZOFRAN) 8 MG tablet Take 1 tablet (8 mg total) by mouth 3 (three) times daily as needed for nausea or vomiting. 03/12/17   Ronnell Freshwater, NP  oxybutynin (DITROPAN) 5 MG tablet Take 1 tablet (5 mg total) by mouth 2 (two) times daily. 05/13/17   Ronnell Freshwater, NP  predniSONE (DELTASONE) 10 MG tablet Take 1 tablet (10 mg total) by mouth 2 (two) times daily with a meal. 10/03/17   Jeramy Dimmick, Dannielle Karvonen, PA-C  ranitidine (ZANTAC) 150 MG tablet Take 1 tablet (150 mg total) by mouth 2 (two) times daily. 10/03/17   Kellie Chisolm, Dannielle Karvonen, PA-C  rOPINIRole (REQUIP) 0.5 MG tablet Take 1 tablet (0.5 mg total) by mouth 2 (two) times daily. IF NEEDED FOR RESTLESS LEGS--NORMALLY JUST AT BEDTIME 05/13/17   Ronnell Freshwater, NP  SUMAtriptan (IMITREX) 100 MG tablet Take 100 mg by mouth every 2 (two) hours as needed (for migraine headaches.). May repeat in 1 hours if headache persists or recurs.    [provider]  tamsulosin (FLOMAX) 0.4 MG CAPS capsule Take by mouth. 04/26/13    [provider]  tiotropium (SPIRIVA) 18 MCG inhalation capsule Place 18 mcg into inhaler and inhale daily as needed (for respiratory issues.).     [provider]  traMADol (ULTRAM) 50 MG tablet Take 50-100 mg by mouth every 6 (six) hours as needed (FOR PAIN.).    [provider]  traZODone (DESYREL) 150 MG tablet Take 150 mg by mouth at bedtime.    [provider]  valACYclovir (VALTREX) 500 MG tablet Take 500 mg by mouth daily as needed (for fever blisters).    [provider]  Vitamin D, Ergocalciferol, (DRISDOL) 50000 units CAPS capsule Take 50,000 Units by mouth every Monday.    [provider]    Allergies Librium [chlordiazepoxide]; Nsaids; Vanilla; Aspirin; Azithromycin; Buprenorphine hcl; Chlordiazepoxide hcl; Iron; Morphine and related; Reglan [metoclopramide]; Sulfa antibiotics; and Tolmetin  Family History  Problem Relation Age of Onset  . Prostate cancer Father   . Kidney Stones Father   . Kidney disease Neg Hx     Social History Social History   Tobacco Use  . Smoking status: Never Smoker  . Smokeless tobacco: Never Used  Substance Use Topics  . Alcohol use: No  . Drug use: No    Review of Systems  Constitutional: Negative for fever. Eyes: Negative for visual changes. ENT: Negative for sore throat. Cardiovascular: Negative for chest pain. Respiratory: Negative for shortness of breath, wheeze, or coughing. Gastrointestinal: Negative for abdominal pain, vomiting and diarrhea. Genitourinary: Negative for dysuria. Musculoskeletal: Negative for back pain. Skin: Positive for rash. Neurological: Negative for headaches, focal weakness or numbness. ____________________________________________  PHYSICAL EXAM:  VITAL SIGNS: ED Triage Vitals [10/03/17 1333]  Enc Vitals Group     BP 131/75     Pulse Rate (!) 118     Resp 16     Temp 99 F (37.2 C)     Temp Source Oral     SpO2 97 %     Weight 166 lb (75.3  kg)     Height 5\' 3"  (1.6 m)     Head Circumference      Peak Flow      Pain Score 8     Pain Loc      Pain Edu?  Excl. in [place]?     Constitutional: Alert and oriented. Well appearing and in no distress. Head: Normocephalic and atraumatic. Eyes: Conjunctivae are normal. PERRL. Normal extraocular movements Ears: Canals clear. TMs intact bilaterally. Nose: No congestion/rhinorrhea/epistaxis. Mouth/Throat: Mucous membranes are moist. Uvula is midline and tonsils are flat. No oral lesions or edema appreciated.  Neck: Supple. No thyromegaly. Cardiovascular: Normal rate, regular rhythm. Normal distal pulses. Respiratory: Normal respiratory effort. No wheezes/rales/rhonchi. Musculoskeletal: Nontender with normal range of motion in all extremities.  Neurologic:  Normal gait without ataxia. Normal speech and language. No gross focal neurologic deficits are appreciated. Skin:  Skin is warm, dry and intact. No rash noted. Patient with excoriations noted to the forearms and upper back. No obvious whelps, macules, papules, or erythema is appreciated where the patient endorse itch.  Psychiatric: Mood and affect are normal. Patient exhibits appropriate insight and judgment. ____________________________________________  PROCEDURES  Dexamethasone 10 mg IM Benadryl 50 mg PO Famotidine 40 mg PO ____________________________________________  INITIAL IMPRESSION / ASSESSMENT AND PLAN / ED COURSE  Patient with ED evaluation and management of idiopathic urticaria. Patient endorses resolution of symptoms after administration of ED antihistamines and steroid. The patient is discharged with Periactin, prednisone, and ranitidine. She will follow-up with her PCP or return as needed.  ____________________________________________  FINAL CLINICAL IMPRESSION(S) / ED DIAGNOSES  Final diagnoses:  Urticaria     Carmie End, Dannielle Karvonen, PA-C 10/07/17 1559    [address] Dannielle Karvonen, PA-C 10/07/17  [place], [place], MD 10/07/17 2225

## 2017-10-14 ENCOUNTER — Ambulatory Visit: Payer: Self-pay | Admitting: Internal Medicine

## 2017-11-09 ENCOUNTER — Ambulatory Visit: Payer: Self-pay | Admitting: Adult Health

## 2017-12-07 ENCOUNTER — Ambulatory Visit (INDEPENDENT_AMBULATORY_CARE_PROVIDER_SITE_OTHER): Payer: Medicare Other | Admitting: Internal Medicine

## 2017-12-07 ENCOUNTER — Encounter: Payer: Self-pay | Admitting: Internal Medicine

## 2017-12-07 VITALS — BP 142/78 | HR 103 | Temp 98.6°F | Resp 16 | Ht 63.0 in | Wt 168.0 lb

## 2017-12-07 DIAGNOSIS — R0602 Shortness of breath: Secondary | ICD-10-CM

## 2017-12-07 DIAGNOSIS — J309 Allergic rhinitis, unspecified: Secondary | ICD-10-CM | POA: Diagnosis not present

## 2017-12-07 DIAGNOSIS — J4551 Severe persistent asthma with (acute) exacerbation: Secondary | ICD-10-CM

## 2017-12-07 DIAGNOSIS — J4541 Moderate persistent asthma with (acute) exacerbation: Secondary | ICD-10-CM

## 2017-12-07 MED ORDER — IPRATROPIUM-ALBUTEROL 20-100 MCG/ACT IN AERS: 1.0000 | INHALATION_SPRAY | Freq: Four times a day (QID) | RESPIRATORY_TRACT | 0 refills | Status: DC

## 2017-12-07 MED ORDER — PREDNISONE 10 MG PO TABS: ORAL_TABLET | ORAL | 0 refills | Status: DC

## 2017-12-07 NOTE — Patient Instructions (Signed)

## 2017-12-07 NOTE — Progress Notes (Signed)
[facility] [place], [place] [address]  Internal MEDICINE  Office Visit Note  Patient Name: Summer Murphy  [id number]  [id number]  Date of Service: 12/07/2017  Chief Complaint  Patient presents with  . Shortness of Breath     HPI Pt is here for a sick visit.  Pt reports she has been having increasing sob x 1 week.  She reports allergy symptoms, but mostly just difficulty breathing.  She has been taking OTC Allergra, and using her nebulizer 2-3 times a day.  She reports using her rescue inhaler (combivent) 3-4 times a day.  She has also been using her Symbicort and Spiriva with no success. She reports wheezing and tightness.      Current Medication:  Outpatient Encounter Medications as of 12/07/2017  Medication Sig Note  . ALPRAZolam (XANAX) 0.25 MG tablet Take 0.25 mg by mouth 2 (two) times daily as needed for anxiety.   Marland Kitchen amitriptyline (ELAVIL) 50 MG tablet Take 50 mg by mouth daily.   Marland Kitchen azelastine (ASTELIN) 0.1 % nasal spray once as needed.   . Brexpiprazole (REXULTI) 4 MG TABS Take 4 mg by mouth at bedtime.    . budesonide-formoterol (SYMBICORT) 160-4.5 MCG/ACT inhaler Inhale 2 puffs into the lungs 2 (two) times daily as needed (for respiratory issues.).    Marland Kitchen butalbital-acetaminophen-caffeine (FIORICET, ESGIC) 50-325-40 MG tablet Take 1 tablet by mouth every 4 (four) hours as needed (FOR MIGRAINE HEADACHES).    . cetirizine (ZYRTEC) 10 MG tablet Take 10 mg by mouth daily as needed for allergies.    . citalopram (CELEXA) 10 MG tablet Take by mouth.   . cyclobenzaprine (FLEXERIL) 10 MG tablet Take 1 tablet (10 mg total) by mouth 2 (two) times daily as needed for muscle spasms.   . cyproheptadine (PERIACTIN) 4 MG tablet Take 1 tablet (4 mg total) by mouth 3 (three) times daily as needed for allergies.   Marland Kitchen diclofenac sodium (VOLTAREN) 1 % GEL Apply 2 g topically 2 (two) times daily as needed (for pain).    Marland Kitchen diltiazem (DILACOR XR) 180 MG 24 hr capsule  Take 180 mg by mouth daily.   . diphenhydrAMINE (BENADRYL) 25 MG tablet Take 50 mg by mouth 2 (two) times daily as needed.    . DULoxetine (CYMBALTA) 30 MG capsule Take 30 mg by mouth at bedtime. 07/22/2016: TOTAL DOSE=90 MG  . estrogens, conjugated, (PREMARIN) 0.45 MG tablet Take 0.45 mg by mouth at bedtime. Reported on 09/09/2015   . gabapentin (NEURONTIN) 600 MG tablet Take 600 mg by mouth 2 (two) times daily.   Marland Kitchen glucose 4 GM chewable tablet Chew 1-2 tablets by mouth 3 (three) times daily as needed for low blood sugar.   . HYDROcodone-acetaminophen (NORCO) 5-325 MG tablet Take 1 tablet by mouth every 4 (four) hours as needed for moderate pain.   . hydrOXYzine (ATARAX/VISTARIL) 25 MG tablet Take 25 mg by mouth 3 (three) times daily as needed (FOR ITCHING).   . Ipratropium-Albuterol (COMBIVENT RESPIMAT) 20-100 MCG/ACT AERS respimat Inhale 1 puff into the lungs every 6 (six) hours.   Marland Kitchen ipratropium-albuterol (DUONEB) 0.5-2.5 (3) MG/3ML SOLN Take 3 mLs by nebulization every 4 (four) hours as needed (for shortness of breath/wheezing).    . LORazepam (ATIVAN) 1 MG tablet Take 1 tablet (1 mg total) by mouth 2 (two) times daily as needed for anxiety. (Patient taking differently: Take 1 mg by mouth every 8 (eight) hours as needed for anxiety. )   . methylPREDNISolone (  MEDROL) 4 MG TBPK tablet Take by mouth as directed for 6 days   . montelukast (SINGULAIR) 10 MG tablet Take 10 mg by mouth at bedtime.   . montelukast (SINGULAIR) 10 MG tablet Take 1 tablet (10 mg total) by mouth at bedtime.   Marland Kitchen omeprazole (PRILOSEC) 40 MG capsule TAKE ONE CAPSULE EVERY DAY   . ondansetron (ZOFRAN) 8 MG tablet Take 1 tablet (8 mg total) by mouth 3 (three) times daily as needed for nausea or vomiting.   Marland Kitchen oxybutynin (DITROPAN) 5 MG tablet Take 1 tablet (5 mg total) by mouth 2 (two) times daily.   . predniSONE (DELTASONE) 10 MG tablet Take 1 tablet (10 mg total) by mouth 2 (two) times daily with a meal.   . ranitidine (ZANTAC)  150 MG tablet Take 1 tablet (150 mg total) by mouth 2 (two) times daily.   Marland Kitchen rOPINIRole (REQUIP) 0.5 MG tablet Take 1 tablet (0.5 mg total) by mouth 2 (two) times daily. IF NEEDED FOR RESTLESS LEGS--NORMALLY JUST AT BEDTIME   . SUMAtriptan (IMITREX) 100 MG tablet Take 100 mg by mouth every 2 (two) hours as needed (for migraine headaches.). May repeat in 1 hours if headache persists or recurs.   . tamsulosin (FLOMAX) 0.4 MG CAPS capsule Take by mouth.   . tiotropium (SPIRIVA) 18 MCG inhalation capsule Place 18 mcg into inhaler and inhale daily as needed (for respiratory issues.).    Marland Kitchen traMADol (ULTRAM) 50 MG tablet Take 50-100 mg by mouth every 6 (six) hours as needed (FOR PAIN.). 07/22/2016: TYPICALLY TAKES 1 TABLET TWICE DAILY  . traZODone (DESYREL) 150 MG tablet Take 150 mg by mouth at bedtime.   . valACYclovir (VALTREX) 500 MG tablet Take 500 mg by mouth daily as needed (for fever blisters).   . Vitamin D, Ergocalciferol, (DRISDOL) 50000 units CAPS capsule Take 50,000 Units by mouth every Monday.    No facility-administered encounter medications on file as of 12/07/2017.       Medical History: Past Medical History:  Diagnosis Date  . Acid reflux   . Anxiety   . Arrhythmia    treated with meds and has no current problems  . Arthritis    most uncomfortable in knees  . Asthma    uses several inhalers  . Depression   . Diabetes mellitus without complication (HCC)    sugars run low, not high sugars  . Fever blister   . Hematuria   . Hypertension   . Kidney stone    stones, 2nd lithotripsy  . Left flank pain   . Migraine   . Restless leg   . Yeast vaginitis      Vital Signs: BP (!) 142/78   Pulse (!) 103   Temp 98.6 F (37 C)   Resp 16   Ht 5\' 3"  (1.6 m)   Wt 168 lb (76.2 kg)   SpO2 97%   BMI 29.76 kg/m    Review of Systems  Constitutional: Negative for chills, fatigue and unexpected weight change.  HENT: Negative for congestion, rhinorrhea, sneezing and sore throat.    Eyes: Negative for photophobia, pain and redness.  Respiratory: Positive for cough, shortness of breath and wheezing. Negative for chest tightness.   Cardiovascular: Negative for chest pain and palpitations.  Gastrointestinal: Negative for abdominal pain, constipation, diarrhea, nausea and vomiting.  Endocrine: Negative.   Genitourinary: Negative for dysuria and frequency.  Musculoskeletal: Negative for arthralgias, back pain, joint swelling and neck pain.  Skin: Negative for rash.  Allergic/Immunologic: Negative.   Neurological: Negative for tremors and numbness.  Hematological: Negative for adenopathy. Does not bruise/bleed easily.  Psychiatric/Behavioral: Negative for behavioral problems and sleep disturbance. The patient is not nervous/anxious.     Physical Exam  Constitutional: She is oriented to person, place, and time. She appears well-developed and well-nourished. No distress.  HENT:  Head: Normocephalic and atraumatic.  Mouth/Throat: Oropharynx is clear and moist. No oropharyngeal exudate.  Eyes: Pupils are equal, round, and reactive to light. EOM are normal.  Neck: Normal range of motion. Neck supple. No JVD present. No tracheal deviation present. No thyromegaly present.  Cardiovascular: Normal rate, regular rhythm and normal heart sounds. Exam reveals no gallop and no friction rub.  No murmur heard. Pulmonary/Chest: Effort normal and breath sounds normal. No respiratory distress. She has no wheezes. She has no rales. She exhibits no tenderness.  Abdominal: Soft. There is no tenderness. There is no guarding.  Musculoskeletal: Normal range of motion.  Lymphadenopathy:    She has no cervical adenopathy.  Neurological: She is alert and oriented to person, place, and time. No cranial nerve deficit.  Skin: Skin is warm and dry. She is not diaphoretic.  Psychiatric: She has a normal mood and affect. Her behavior is normal. Judgment and thought content normal.  Nursing note and  vitals reviewed.  Assessment/Plan: 1. Severe persistent asthma with acute exacerbation Take Prednisone as directed.  Follow up in clinic if worsening in next 48-72 hours.  - predniSONE (DELTASONE) 10 MG tablet; Use per dose pack  Dispense: 21 tablet; Refill: 0 Refilled Combivent Respimat.   2. SOB (shortness of breath) Continue breathing treatments as directed.  3. Allergic rhinitis, unspecified seasonality, unspecified trigger Continue Allergra as directed.   General Counseling: Charmane verbalizes understanding of the findings of todays visit and agrees with plan of treatment. I have discussed any further diagnostic evaluation that may be needed or ordered today. We also reviewed her medications today. she has been encouraged to call the office with any questions or concerns that should arise related to todays visit.   No orders of the defined types were placed in this encounter.   No orders of the defined types were placed in this encounter.   Time spent: 25 Minutes This patient was seen by Orson Gear AGNP-C in Collaboration with Dr. Devona Konig as a part of collaborative care agreement.

## 2018-01-05 ENCOUNTER — Other Ambulatory Visit: Payer: Self-pay

## 2018-01-05 MED ORDER — ROPINIROLE HCL 0.5 MG PO TABS: 0.5000 mg | ORAL_TABLET | Freq: Two times a day (BID) | ORAL | 3 refills | Status: DC

## 2018-02-09 DIAGNOSIS — H2513 Age-related nuclear cataract, bilateral: Secondary | ICD-10-CM | POA: Diagnosis not present

## 2018-02-22 ENCOUNTER — Ambulatory Visit: Payer: Self-pay | Admitting: Nurse Practitioner

## 2018-02-23 ENCOUNTER — Other Ambulatory Visit: Payer: Self-pay

## 2018-02-23 DIAGNOSIS — Z947 Corneal transplant status: Secondary | ICD-10-CM | POA: Diagnosis not present

## 2018-02-23 MED ORDER — DILTIAZEM HCL ER 180 MG PO CP24: ORAL_CAPSULE | ORAL | 5 refills | Status: DC

## 2018-02-28 ENCOUNTER — Encounter: Payer: Self-pay | Admitting: Internal Medicine

## 2018-02-28 ENCOUNTER — Ambulatory Visit (INDEPENDENT_AMBULATORY_CARE_PROVIDER_SITE_OTHER): Payer: Medicare Other | Admitting: Internal Medicine

## 2018-02-28 VITALS — BP 112/76 | HR 105 | Temp 98.2°F | Resp 18 | Ht 63.0 in | Wt 164.2 lb

## 2018-02-28 DIAGNOSIS — J209 Acute bronchitis, unspecified: Secondary | ICD-10-CM | POA: Diagnosis not present

## 2018-02-28 DIAGNOSIS — R0602 Shortness of breath: Secondary | ICD-10-CM | POA: Diagnosis not present

## 2018-02-28 DIAGNOSIS — J301 Allergic rhinitis due to pollen: Secondary | ICD-10-CM

## 2018-02-28 DIAGNOSIS — K2270 Barrett's esophagus without dysplasia: Secondary | ICD-10-CM | POA: Diagnosis not present

## 2018-02-28 DIAGNOSIS — J45909 Unspecified asthma, uncomplicated: Secondary | ICD-10-CM

## 2018-02-28 MED ORDER — LEVOFLOXACIN 500 MG PO TABS: 500.0000 mg | ORAL_TABLET | Freq: Every day | ORAL | 0 refills | Status: DC

## 2018-02-28 MED ORDER — PREDNISONE 10 MG (21) PO TBPK: ORAL_TABLET | ORAL | 0 refills | Status: DC

## 2018-02-28 NOTE — Progress Notes (Signed)
[facility] [place], [place] [address]  Pulmonary Sleep Medicine   Office Visit Note  Patient Name: Summer Murphy DOB: [birth year] MRN [id number]  Date of Service: 02/28/2018  Complaints/HPI: cough SOB wheeze she has been having upper airway congestion.  She has been having no chest pain no fevers no chills.  There is no hemoptysis noted.  She has some postnasal drip and drainage.  ROS  General: (-) fever, (-) chills, (-) night sweats, (-) weakness Skin: (-) rashes, (-) itching,. Eyes: (-) visual changes, (-) redness, (-) itching. Nose and Sinuses: (-) nasal stuffiness or itchiness, (-) postnasal drip, (-) nosebleeds, (-) sinus trouble. Mouth and Throat: (-) sore throat, (-) hoarseness. Neck: (-) swollen glands, (-) enlarged thyroid, (-) neck pain. Respiratory: + cough, (-) bloody sputum, + shortness of breath, - wheezing. Cardiovascular: - ankle swelling, (-) chest pain. Lymphatic: (-) lymph node enlargement. Neurologic: (-) numbness, (-) tingling. Psychiatric: (-) anxiety, (-) depression   Current Medication: Outpatient Encounter Medications as of 02/28/2018  Medication Sig Note  . ALPRAZolam (XANAX) 0.25 MG tablet Take 0.25 mg by mouth 2 (two) times daily as needed for anxiety.   Marland Kitchen amitriptyline (ELAVIL) 50 MG tablet Take 50 mg by mouth daily.   Marland Kitchen azelastine (ASTELIN) 0.1 % nasal spray once as needed.   . Brexpiprazole (REXULTI) 4 MG TABS Take 4 mg by mouth at bedtime.    . budesonide-formoterol (SYMBICORT) 160-4.5 MCG/ACT inhaler Inhale 2 puffs into the lungs 2 (two) times daily as needed (for respiratory issues.).    Marland Kitchen butalbital-acetaminophen-caffeine (FIORICET, ESGIC) 50-325-40 MG tablet Take 1 tablet by mouth every 4 (four) hours as needed (FOR MIGRAINE HEADACHES).    . cetirizine (ZYRTEC) 10 MG tablet Take 10 mg by mouth daily as needed for allergies.    . citalopram (CELEXA) 10 MG tablet Take by mouth.   . cyclobenzaprine (FLEXERIL) 10 MG  tablet Take 1 tablet (10 mg total) by mouth 2 (two) times daily as needed for muscle spasms.   . cyproheptadine (PERIACTIN) 4 MG tablet Take 1 tablet (4 mg total) by mouth 3 (three) times daily as needed for allergies.   Marland Kitchen diclofenac sodium (VOLTAREN) 1 % GEL Apply 2 g topically 2 (two) times daily as needed (for pain).    Marland Kitchen diltiazem (DILACOR XR) 180 MG 24 hr capsule Take 1 cap by po daily   . diphenhydrAMINE (BENADRYL) 25 MG tablet Take 50 mg by mouth 2 (two) times daily as needed.    . DULoxetine (CYMBALTA) 30 MG capsule Take 30 mg by mouth at bedtime. 07/22/2016: TOTAL DOSE=90 MG  . estrogens, conjugated, (PREMARIN) 0.45 MG tablet Take 0.45 mg by mouth at bedtime. Reported on 09/09/2015   . gabapentin (NEURONTIN) 600 MG tablet Take 600 mg by mouth 2 (two) times daily.   Marland Kitchen glucose 4 GM chewable tablet Chew 1-2 tablets by mouth 3 (three) times daily as needed for low blood sugar.   . HYDROcodone-acetaminophen (NORCO) 5-325 MG tablet Take 1 tablet by mouth every 4 (four) hours as needed for moderate pain.   . hydrOXYzine (ATARAX/VISTARIL) 25 MG tablet Take 25 mg by mouth 3 (three) times daily as needed (FOR ITCHING).   . Ipratropium-Albuterol (COMBIVENT RESPIMAT) 20-100 MCG/ACT AERS respimat Inhale 1 puff into the lungs every 6 (six) hours.   Marland Kitchen ipratropium-albuterol (DUONEB) 0.5-2.5 (3) MG/3ML SOLN Take 3 mLs by nebulization every 4 (four) hours as needed (for shortness of breath/wheezing).    . LORazepam (ATIVAN) 1  MG tablet Take 1 tablet (1 mg total) by mouth 2 (two) times daily as needed for anxiety. (Patient taking differently: Take 1 mg by mouth every 8 (eight) hours as needed for anxiety. )   . montelukast (SINGULAIR) 10 MG tablet Take 1 tablet (10 mg total) by mouth at bedtime.   Marland Kitchen omeprazole (PRILOSEC) 40 MG capsule TAKE ONE CAPSULE EVERY DAY   . ondansetron (ZOFRAN) 8 MG tablet Take 1 tablet (8 mg total) by mouth 3 (three) times daily as needed for nausea or vomiting.   Marland Kitchen oxybutynin  (DITROPAN) 5 MG tablet Take 1 tablet (5 mg total) by mouth 2 (two) times daily.   . ranitidine (ZANTAC) 150 MG tablet Take 1 tablet (150 mg total) by mouth 2 (two) times daily.   Marland Kitchen rOPINIRole (REQUIP) 0.5 MG tablet Take 1 tablet (0.5 mg total) by mouth 2 (two) times daily. IF NEEDED FOR RESTLESS LEGS--NORMALLY JUST AT BEDTIME   . SUMAtriptan (IMITREX) 100 MG tablet Take 100 mg by mouth every 2 (two) hours as needed (for migraine headaches.). May repeat in 1 hours if headache persists or recurs.   . tamsulosin (FLOMAX) 0.4 MG CAPS capsule Take by mouth.   . tiotropium (SPIRIVA) 18 MCG inhalation capsule Place 18 mcg into inhaler and inhale daily as needed (for respiratory issues.).    Marland Kitchen traMADol (ULTRAM) 50 MG tablet Take 50-100 mg by mouth every 6 (six) hours as needed (FOR PAIN.). 07/22/2016: TYPICALLY TAKES 1 TABLET TWICE DAILY  . traZODone (DESYREL) 150 MG tablet Take 150 mg by mouth at bedtime.   . valACYclovir (VALTREX) 500 MG tablet Take 500 mg by mouth daily as needed (for fever blisters).   . Vitamin D, Ergocalciferol, (DRISDOL) 50000 units CAPS capsule Take 50,000 Units by mouth every Monday.   . methylPREDNISolone (MEDROL) 4 MG TBPK tablet Take by mouth as directed for 6 days (Patient not taking: Reported on 02/28/2018)   . [DISCONTINUED] montelukast (SINGULAIR) 10 MG tablet Take 10 mg by mouth at bedtime.   . [DISCONTINUED] predniSONE (DELTASONE) 10 MG tablet Take 1 tablet (10 mg total) by mouth 2 (two) times daily with a meal. (Patient not taking: Reported on 02/28/2018)   . [DISCONTINUED] predniSONE (DELTASONE) 10 MG tablet Use per dose Murphy (Patient not taking: Reported on 02/28/2018)    No facility-administered encounter medications on file as of 02/28/2018.     Surgical History: Past Surgical History:  Procedure Laterality Date  . ABDOMINAL HYSTERECTOMY    . AMPUTATION TOE Left 07/31/2016   Procedure: AMPUTATION TOE/MPJ 2nd toe;  Surgeon: Sharlotte Alamo, DPM;  Location: [facility] ORS;   Service: Podiatry;  Laterality: Left;  . APPENDECTOMY  1990  . BACK SURGERY    . BREAST SURGERY     bilateral breast reduction  . CARDIAC ELECTROPHYSIOLOGY STUDY AND ABLATION    . CHOLECYSTECTOMY  1990  . COLONOSCOPY WITH PROPOFOL N/A 01/04/2017   Procedure: COLONOSCOPY WITH PROPOFOL;  Surgeon: Manya Silvas, MD;  Location: [facility] ENDOSCOPY;  Service: Endoscopy;  Laterality: N/A;  . CORNEAL TRANSPLANT    . ESOPHAGOGASTRODUODENOSCOPY (EGD) WITH PROPOFOL N/A 01/04/2017   Procedure: ESOPHAGOGASTRODUODENOSCOPY (EGD) WITH PROPOFOL;  Surgeon: Manya Silvas, MD;  Location: [facility]COPY;  Service: Endoscopy;  Laterality: N/A;  . EXCISION BONE CYST Left 07/31/2016   Procedure: EXCISION BONE CYST/exostectomy 28124/left 2nd;  Surgeon: Sharlotte Alamo, DPM;  Location: [facility] ORS;  Service: Podiatry;  Laterality: Left;  . EXTRACORPOREAL SHOCK WAVE LITHOTRIPSY Left 09/12/2015   Procedure: EXTRACORPOREAL SHOCK  WAVE LITHOTRIPSY (ESWL);  Surgeon: Hollice Espy, MD;  Location: [facility] ORS;  Service: Urology;  Laterality: Left;  . FRACTURE SURGERY     left foot  . HH repair    . JOINT REPLACEMENT Bilateral 2013,2014   total knees  . LAPAROSCOPIC HYSTERECTOMY    . LITHOTRIPSY    . TONSILLECTOMY    . URETEROSCOPY      Medical History: Past Medical History:  Diagnosis Date  . Acid reflux   . Anxiety   . Arrhythmia    treated with meds and has no current problems  . Arthritis    most uncomfortable in knees  . Asthma    uses several inhalers  . Depression   . Diabetes mellitus without complication (HCC)    sugars run low, not high sugars  . Fever blister   . Hematuria   . Hypertension   . Kidney stone    stones, 2nd lithotripsy  . Left flank pain   . Migraine   . Restless leg   . Yeast vaginitis     Family History: Family History  Problem Relation Age of Onset  . Prostate cancer Father   . Kidney Stones Father   . Kidney disease Neg Hx     Social History: Social History    Socioeconomic History  . Marital status: Divorced    Spouse name: Not on file  . Number of children: Not on file  . Years of education: Not on file  . Highest education level: Not on file  Occupational History  . Not on file  Social Needs  . Financial resource strain: Not on file  . Food insecurity:    Worry: Not on file    Inability: Not on file  . Transportation needs:    Medical: Not on file    Non-medical: Not on file  Tobacco Use  . Smoking status: Never Smoker  . Smokeless tobacco: Never Used  Substance and Sexual Activity  . Alcohol use: No  . Drug use: No  . Sexual activity: Not on file  Lifestyle  . Physical activity:    Days per week: Not on file    Minutes per session: Not on file  . Stress: Not on file  Relationships  . Social connections:    Talks on phone: Not on file    Gets together: Not on file    Attends religious service: Not on file    Active member of club or organization: Not on file    Attends meetings of clubs or organizations: Not on file    Relationship status: Not on file  . Intimate partner violence:    Fear of current or ex partner: Not on file    Emotionally abused: Not on file    Physically abused: Not on file    Forced sexual activity: Not on file  Other Topics Concern  . Not on file  Social History Narrative  . Not on file    Vital Signs: Blood pressure 112/76, pulse (!) 105, temperature 98.2 F (36.8 C), temperature source Oral, resp. rate 18, height 5\' 3"  (1.6 m), weight 164 lb 3.2 oz (74.5 kg), SpO2 97 %.  Examination: General Appearance: The patient is well-developed, well-nourished, and in no distress. Skin: Gross inspection of skin unremarkable. Head: normocephalic, no gross deformities. Eyes: no gross deformities noted. ENT: ears appear grossly normal no exudates. Neck: Supple. No thyromegaly. No LAD. Respiratory: few rhonchi nopted today. Cardiovascular: Normal S1 and S2 without murmur or  rub. Extremities: No  cyanosis. pulses are equal. Neurologic: Alert and oriented. No involuntary movements.  LABS: No results found for this or any previous visit (from the past 2160 hour(s)).  Radiology: No results found.  No results found.  No results found.    Assessment and Plan: Patient Active Problem List   Diagnosis Date Noted  . Hypertension 07/01/2017  . Acute bronchitis with asthma 07/01/2017  . Allergic rhinitis 07/01/2017  . Asthma 01/20/2016  . Severe recurrent major depression without psychotic features ([place]) 09/10/2014  . COPD (chronic obstructive pulmonary disease) ([place]) 09/10/2014  . Calculi, ureter 04/26/2013  . Corneal graft malfunction 08/17/2012  . Calculus of kidney 04/26/2012  . Renal colic 62/37/6283  . Urge incontinence 04/26/2012  . Diaphragmatic hernia 10/27/2010  . Barrett esophagus 07/07/2010    1. Acute ASthma mild exacerbation will continue with supportive care placed her on short course of antibiotics we will see how she does.  We will continue with other meds in place 2. SOB likely secondary to the underlying mild exacerbation will continue with present supportive care follow-up pulmonary functions were recommended at this time 3. Allergic Rhinitis stable at this time continue with current management 4. GERD/Barretts follow-up with her primary GI  General Counseling: I have discussed the findings of the evaluation and examination with Mellina.  I have also discussed any further diagnostic evaluation thatmay be needed or ordered today. Shyniece verbalizes understanding of the findings of todays visit. We also reviewed her medications today and discussed drug interactions and side effects including but not limited excessive drowsiness and altered mental states. We also discussed that there is always a risk not just to her but also people around her. she has been encouraged to call the office with any questions or concerns that should arise related to todays  visit.  Orders Placed This Encounter  Procedures  . Pulmonary function test    Standing Status:   Future    Standing Expiration Date:   03/01/2019    Order Specific Question:   Where should this test be performed?    Answer:   [facility]    Time spent:80min  I have personally obtained a history, examined the patient, evaluated laboratory and imaging results, formulated the assessment and plan and placed orders.    Allyne Gee, MD [facility] Pulmonary and Critical Care Sleep medicine

## 2018-02-28 NOTE — Patient Instructions (Signed)

## 2018-03-21 ENCOUNTER — Other Ambulatory Visit: Payer: Self-pay

## 2018-03-21 ENCOUNTER — Emergency Department
Admission: EM | Admit: 2018-03-21 | Discharge: 2018-03-21 | Disposition: A | Discharge status: Home / Self Care | Payer: Medicare Other | Attending: Emergency Medicine | Admitting: Emergency Medicine

## 2018-03-21 DIAGNOSIS — E119 Type 2 diabetes mellitus without complications: Secondary | ICD-10-CM | POA: Insufficient documentation

## 2018-03-21 DIAGNOSIS — I10 Essential (primary) hypertension: Secondary | ICD-10-CM | POA: Diagnosis not present

## 2018-03-21 DIAGNOSIS — F329 Major depressive disorder, single episode, unspecified: Secondary | ICD-10-CM | POA: Insufficient documentation

## 2018-03-21 DIAGNOSIS — F419 Anxiety disorder, unspecified: Secondary | ICD-10-CM | POA: Diagnosis present

## 2018-03-21 DIAGNOSIS — Z96653 Presence of artificial knee joint, bilateral: Secondary | ICD-10-CM | POA: Diagnosis not present

## 2018-03-21 DIAGNOSIS — Z79899 Other long term (current) drug therapy: Secondary | ICD-10-CM | POA: Insufficient documentation

## 2018-03-21 DIAGNOSIS — J449 Chronic obstructive pulmonary disease, unspecified: Secondary | ICD-10-CM | POA: Insufficient documentation

## 2018-03-21 DIAGNOSIS — Z760 Encounter for issue of repeat prescription: Secondary | ICD-10-CM

## 2018-03-21 DIAGNOSIS — J45909 Unspecified asthma, uncomplicated: Secondary | ICD-10-CM | POA: Diagnosis not present

## 2018-03-21 DIAGNOSIS — Z9049 Acquired absence of other specified parts of digestive tract: Secondary | ICD-10-CM | POA: Insufficient documentation

## 2018-03-21 MED ORDER — LORAZEPAM 1 MG PO TABS
1.0000 mg | ORAL_TABLET | Freq: Once | ORAL | Status: AC
  Administered 2018-03-21: 1 mg via ORAL
  Filled 2018-03-21: qty 1

## 2018-03-21 MED ORDER — TRAZODONE HCL 150 MG PO TABS: 150.0000 mg | ORAL_TABLET | Freq: Every day | ORAL | 0 refills | Status: DC

## 2018-03-21 MED ORDER — DULOXETINE HCL 60 MG PO CPEP: 60.0000 mg | ORAL_CAPSULE | Freq: Every day | ORAL | 0 refills | Status: DC

## 2018-03-21 NOTE — ED Notes (Signed)
Report to include Situation, Background, Assessment, and Recommendations received from Amy RN. Patient alert and oriented, warm and dry, in no acute distress. Patient denies SI, HI, AVH and pain. Patient made aware of Q15 minute rounds and Rover and Officer presence for their safety. Patient instructed to come to me with needs or concerns.   

## 2018-03-21 NOTE — ED Provider Notes (Signed)
[facility] Emergency Department Provider Note  ____________________________________________   I have reviewed the triage vital signs and the nursing notes. Where available I have reviewed prior notes and, if possible and indicated, outside hospital notes.    HISTORY  Chief Complaint Anxiety and Medication Management    HPI Summer Murphy is a 62 y.o. female  history of anxiety, she had a upper respiratory illness which is since resolved but it caused her to miss her appointments with her doctor and she was unable to get her duloxetine or her trazodone prescriptions filled.  She states she is been very anxious a result.  She has no SI or HI.  She does have a close outpatient follow-up with her doctor on Wednesday, and would like me to fill those prescriptions until that time.  She has no thoughts of hurting herself.  She is anxious but not suicidal.  She would also like something to calm her nerves while she is here.  She has no other complaints.     Past Medical History:  Diagnosis Date  . Acid reflux   . Anxiety   . Arrhythmia    treated with meds and has no current problems  . Arthritis    most uncomfortable in knees  . Asthma    uses several inhalers  . Depression   . Diabetes mellitus without complication (HCC)    sugars run low, not high sugars  . Fever blister   . Hematuria   . Hypertension   . Kidney stone    stones, 2nd lithotripsy  . Left flank pain   . Migraine   . Restless leg   . Yeast vaginitis     Patient Active Problem List   Diagnosis Date Noted  . Hypertension 07/01/2017  . Acute bronchitis with asthma 07/01/2017  . Allergic rhinitis 07/01/2017  . Asthma 01/20/2016  . Severe recurrent major depression without psychotic features ([place]) 09/10/2014  . COPD (chronic obstructive pulmonary disease) ([place]) 09/10/2014  . Calculi, ureter 04/26/2013  . Corneal graft malfunction 08/17/2012  . Calculus of kidney 04/26/2012  . Renal  colic 16/60/6301  . Urge incontinence 04/26/2012  . Diaphragmatic hernia 10/27/2010  . Barrett esophagus 07/07/2010    Past Surgical History:  Procedure Laterality Date  . ABDOMINAL HYSTERECTOMY    . AMPUTATION TOE Left 07/31/2016   Procedure: AMPUTATION TOE/MPJ 2nd toe;  Surgeon: Sharlotte Alamo, DPM;  Location: [facility] ORS;  Service: Podiatry;  Laterality: Left;  . APPENDECTOMY  1990  . BACK SURGERY    . BREAST SURGERY     bilateral breast reduction  . CARDIAC ELECTROPHYSIOLOGY STUDY AND ABLATION    . CHOLECYSTECTOMY  1990  . COLONOSCOPY WITH PROPOFOL N/A 01/04/2017   Procedure: COLONOSCOPY WITH PROPOFOL;  Surgeon: Manya Silvas, MD;  Location: [facility] ENDOSCOPY;  Service: Endoscopy;  Laterality: N/A;  . CORNEAL TRANSPLANT    . ESOPHAGOGASTRODUODENOSCOPY (EGD) WITH PROPOFOL N/A 01/04/2017   Procedure: ESOPHAGOGASTRODUODENOSCOPY (EGD) WITH PROPOFOL;  Surgeon: Manya Silvas, MD;  Location: [facility] ENDOSCOPY;  Service: Endoscopy;  Laterality: N/A;  . EXCISION BONE CYST Left 07/31/2016   Procedure: EXCISION BONE CYST/exostectomy 28124/left 2nd;  Surgeon: Sharlotte Alamo, DPM;  Location: [facility] ORS;  Service: Podiatry;  Laterality: Left;  . EXTRACORPOREAL SHOCK WAVE LITHOTRIPSY Left 09/12/2015   Procedure: EXTRACORPOREAL SHOCK WAVE LITHOTRIPSY (ESWL);  Surgeon: Hollice Espy, MD;  Location: [facility] ORS;  Service: Urology;  Laterality: Left;  . FRACTURE SURGERY     left foot  . [place]  repair    . JOINT REPLACEMENT Bilateral 2013,2014   total knees  . LAPAROSCOPIC HYSTERECTOMY    . LITHOTRIPSY    . TONSILLECTOMY    . URETEROSCOPY      Prior to Admission medications   Medication Sig Start Date End Date Taking? Authorizing Provider  ALPRAZolam (XANAX) 0.25 MG tablet Take 0.25 mg by mouth 2 (two) times daily as needed for anxiety.    [provider]  amitriptyline (ELAVIL) 50 MG tablet Take 50 mg by mouth daily.    [provider]  azelastine (ASTELIN) 0.1 % nasal spray once as needed.  08/22/15   [provider]  Brexpiprazole (REXULTI) 4 MG TABS Take 4 mg by mouth at bedtime.     [provider]  budesonide-formoterol (SYMBICORT) 160-4.5 MCG/ACT inhaler Inhale 2 puffs into the lungs 2 (two) times daily as needed (for respiratory issues.).     [provider]  butalbital-acetaminophen-caffeine (FIORICET, ESGIC) 50-325-40 MG tablet Take 1 tablet by mouth every 4 (four) hours as needed (FOR MIGRAINE HEADACHES).  02/23/14   [provider]  cetirizine (ZYRTEC) 10 MG tablet Take 10 mg by mouth daily as needed for allergies.     [provider]  citalopram (CELEXA) 10 MG tablet Take by mouth.    [provider]  cyclobenzaprine (FLEXERIL) 10 MG tablet Take 1 tablet (10 mg total) by mouth 2 (two) times daily as needed for muscle spasms. 08/31/17   Ronnell Freshwater, NP  cyproheptadine (PERIACTIN) 4 MG tablet Take 1 tablet (4 mg total) by mouth 3 (three) times daily as needed for allergies. 10/03/17   Menshew, Dannielle Karvonen, PA-C  diclofenac sodium (VOLTAREN) 1 % GEL Apply 2 g topically 2 (two) times daily as needed (for pain).     [provider]  diltiazem (DILACOR XR) 180 MG 24 hr capsule Take 1 cap by po daily 02/23/18   Ronnell Freshwater, NP  diphenhydrAMINE (BENADRYL) 25 MG tablet Take 50 mg by mouth 2 (two) times daily as needed.     [provider]  DULoxetine (CYMBALTA) 30 MG capsule Take 30 mg by mouth at bedtime.    [provider]  estrogens, conjugated, (PREMARIN) 0.45 MG tablet Take 0.45 mg by mouth at bedtime. Reported on 09/09/2015    [provider]  gabapentin (NEURONTIN) 600 MG tablet Take 600 mg by mouth 2 (two) times daily.    [provider]  glucose 4 GM chewable tablet Chew 1-2 tablets by mouth 3 (three) times daily as needed for low blood sugar.    [provider]  HYDROcodone-acetaminophen (NORCO) 5-325 MG tablet Take 1 tablet by mouth every 4 (four) hours as  needed for moderate pain. 07/31/16   Sharlotte Alamo, DPM  hydrOXYzine (ATARAX/VISTARIL) 25 MG tablet Take 25 mg by mouth 3 (three) times daily as needed (FOR ITCHING).    [provider]  Ipratropium-Albuterol (COMBIVENT RESPIMAT) 20-100 MCG/ACT AERS respimat Inhale 1 puff into the lungs every 6 (six) hours. 12/07/17   Scarboro, Audie Clear, NP  ipratropium-albuterol (DUONEB) 0.5-2.5 (3) MG/3ML SOLN Take 3 mLs by nebulization every 4 (four) hours as needed (for shortness of breath/wheezing).     [provider]  levofloxacin (LEVAQUIN) 500 MG tablet Take 1 tablet (500 mg total) by mouth daily. 02/28/18   Allyne Gee, MD  LORazepam (ATIVAN) 1 MG tablet Take 1 tablet (1 mg total) by mouth 2 (two) times daily as needed for anxiety. Patient taking  differently: Take 1 mg by mouth every 8 (eight) hours as needed for anxiety.  04/18/16   Daymon Larsen, MD  methylPREDNISolone (MEDROL) 4 MG TBPK tablet Take by mouth as directed for 6 days Patient not taking: Reported on 02/28/2018 07/01/17   Ronnell Freshwater, NP  montelukast (SINGULAIR) 10 MG tablet Take 1 tablet (10 mg total) by mouth at bedtime. 08/26/17   Allyne Gee, MD  omeprazole (PRILOSEC) 40 MG capsule TAKE ONE CAPSULE EVERY DAY 05/24/17   Ronnell Freshwater, NP  ondansetron (ZOFRAN) 8 MG tablet Take 1 tablet (8 mg total) by mouth 3 (three) times daily as needed for nausea or vomiting. 03/12/17   Ronnell Freshwater, NP  oxybutynin (DITROPAN) 5 MG tablet Take 1 tablet (5 mg total) by mouth 2 (two) times daily. 05/13/17   Ronnell Freshwater, NP  predniSONE (STERAPRED UNI-PAK 21 TAB) 10 MG (21) TBPK tablet As directed 02/28/18   Allyne Gee, MD  ranitidine (ZANTAC) 150 MG tablet Take 1 tablet (150 mg total) by mouth 2 (two) times daily. 10/03/17   Menshew, Dannielle Karvonen, PA-C  rOPINIRole (REQUIP) 0.5 MG tablet Take 1 tablet (0.5 mg total) by mouth 2 (two) times daily. IF NEEDED FOR RESTLESS LEGS--NORMALLY JUST AT BEDTIME 01/05/18   Ronnell Freshwater, NP  SUMAtriptan (IMITREX) 100 MG tablet Take 100 mg by mouth every 2 (two) hours as needed (for migraine headaches.). May repeat in 1 hours if headache persists or recurs.    [provider]  tamsulosin (FLOMAX) 0.4 MG CAPS capsule Take by mouth. 04/26/13   [provider]  tiotropium (SPIRIVA) 18 MCG inhalation capsule Place 18 mcg into inhaler and inhale daily as needed (for respiratory issues.).     [provider]  traMADol (ULTRAM) 50 MG tablet Take 50-100 mg by mouth every 6 (six) hours as needed (FOR PAIN.).    [provider]  traZODone (DESYREL) 150 MG tablet Take 150 mg by mouth at bedtime.    [provider]  valACYclovir (VALTREX) 500 MG tablet Take 500 mg by mouth daily as needed (for fever blisters).    [provider]  Vitamin D, Ergocalciferol, (DRISDOL) 50000 units CAPS capsule Take 50,000 Units by mouth every Monday.    [provider]    Allergies Librium [chlordiazepoxide]; Nsaids; Vanilla; Aspirin; Azithromycin; Buprenorphine hcl; Chlordiazepoxide hcl; Iron; Morphine and related; Reglan [metoclopramide]; Sulfa antibiotics; and Tolmetin  Family History  Problem Relation Age of Onset  . Prostate cancer Father   . Kidney Stones Father   . Kidney disease Neg Hx     Social History Social History   Tobacco Use  . Smoking status: Never Smoker  . Smokeless tobacco: Never Used  Substance Use Topics  . Alcohol use: No  . Drug use: No    Review of Systems Constitutional: No fever/chills Eyes: No visual changes. ENT: No sore throat. No stiff neck no neck pain Cardiovascular: Denies chest pain. Respiratory: Denies shortness of breath. Gastrointestinal:   no vomiting.  No diarrhea.  No constipation. Genitourinary: Negative for dysuria. Musculoskeletal: Negative lower extremity swelling Skin: Negative for rash. Neurological: Negative for severe headaches, focal weakness or  numbness.   ____________________________________________   PHYSICAL EXAM:  VITAL SIGNS: ED Triage Vitals  Enc Vitals Group     BP 03/21/18 1744 (!) 172/97     Pulse Rate 03/21/18 1744 (!) 113     Resp 03/21/18 1744 18     Temp 03/21/18  1744 98.9 F (37.2 C)     Temp Source 03/21/18 1744 Oral     SpO2 03/21/18 1744 99 %     Weight 03/21/18 1744 163 lb 2.3 oz (74 kg)     Height 03/21/18 1744 5\' 3"  (1.6 m)     Head Circumference --      Peak Flow --      Pain Score 03/21/18 1848 0     Pain Loc --      Pain Edu? --      Excl. in [place]? --     Constitutional: Alert and oriented. Well appearing and in no acute distress.  Patient is anxious but otherwise well-appearing Eyes: Conjunctivae are normal Head: Atraumatic HEENT: No congestion/rhinnorhea. Mucous membranes are moist.  Oropharynx non-erythematous Neck:   Nontender with no meningismus, no masses, no stridor Cardiovascular: Normal rate, regular rhythm. Grossly normal heart sounds.  Good peripheral circulation. Respiratory: Normal respiratory effort.  No retractions. Lungs CTAB. Abdominal: Soft and nontender. No distention. No guarding no rebound Back:  There is no focal tenderness or step off.  there is no midline tenderness there are no lesions  Musculoskeletal: No lower extremity tenderness, no upper extremity tenderness. No joint effusions, no DVT signs strong distal pulses no edema Neurologic:  Normal speech and language. No gross focal neurologic deficits are appreciated.  Skin:  Skin is warm, dry and intact. No rash noted. Psychiatric: Mood and affect are anxious. Speech and behavior are normal.  ____________________________________________   LABS (all labs ordered are listed, but only abnormal results are displayed)  Labs Reviewed - No data to display  Pertinent labs  results that were available during my care of the patient were reviewed by me and considered in my medical decision making (see chart for  details). ____________________________________________  EKG  I personally interpreted any EKGs ordered by me or triage  ____________________________________________  RADIOLOGY  Pertinent labs & imaging results that were available during my care of the patient were reviewed by me and considered in my medical decision making (see chart for details). If possible, patient and/or family made aware of any abnormal findings.  No results found. ____________________________________________    PROCEDURES  Procedure(s) performed: None  Procedures  Critical Care performed: None  ____________________________________________   INITIAL IMPRESSION / ASSESSMENT AND PLAN / ED COURSE  Pertinent labs & imaging results that were available during my care of the patient were reviewed by me and considered in my medical decision making (see chart for details).  Patient here with anxiety, she is requesting Ativan here which I will give her, and she is requesting a temporary fill of her psychiatric medications but I do not normally do but I think in this case would be in the patient's best interest.  There is no SI or HI reason to have psychiatric admission at this time.  Her husband is at bedside and he confirms this.  We will therefore send her home with her medications.  Patient does have a Librium intolerance but has had Ativan without any difficulty in the past.    ____________________________________________   FINAL CLINICAL IMPRESSION(S) / ED DIAGNOSES  Final diagnoses:  None      This chart was dictated using voice recognition software.  Despite best efforts to proofread,  errors can occur which can change meaning.      Schuyler Amor, MD 03/21/18 2008

## 2018-03-21 NOTE — ED Notes (Signed)
Pt given sprite to drink. 

## 2018-03-21 NOTE — ED Notes (Signed)
PT  VOL °

## 2018-03-21 NOTE — ED Notes (Signed)
Pt went home via spouse. She is satble with NAD. She was given discharge instruction and prescription. Pt verbalized understanding. No issues.

## 2018-03-21 NOTE — ED Notes (Signed)
Hourly rounding reveals patient in 19H . No complaints, stable, in no acute distress. Q15 minute rounds and monitoring via Engineer, drilling to continue.

## 2018-03-21 NOTE — ED Triage Notes (Signed)
Pt states that she is hyperventilating, anxious and is off of her two psych medications. Unable to get medication until Wednesday. Pt using trazadone 150mg  at bedtime, duloxetine hcl dr 60mg  cap daily.

## 2018-03-30 ENCOUNTER — Ambulatory Visit: Payer: Self-pay | Admitting: Internal Medicine

## 2018-03-30 DIAGNOSIS — Z947 Corneal transplant status: Secondary | ICD-10-CM | POA: Diagnosis not present

## 2018-04-01 ENCOUNTER — Other Ambulatory Visit: Payer: Self-pay | Admitting: Internal Medicine

## 2018-04-01 DIAGNOSIS — J4541 Moderate persistent asthma with (acute) exacerbation: Secondary | ICD-10-CM

## 2018-04-06 ENCOUNTER — Ambulatory Visit: Payer: Self-pay | Admitting: Adult Health

## 2018-04-06 ENCOUNTER — Ambulatory Visit: Payer: Self-pay | Admitting: Internal Medicine

## 2018-04-20 ENCOUNTER — Other Ambulatory Visit: Payer: Self-pay

## 2018-04-20 ENCOUNTER — Ambulatory Visit (INDEPENDENT_AMBULATORY_CARE_PROVIDER_SITE_OTHER): Payer: Medicare Other | Admitting: Internal Medicine

## 2018-04-20 ENCOUNTER — Ambulatory Visit: Payer: Self-pay | Admitting: Adult Health

## 2018-04-20 DIAGNOSIS — R0602 Shortness of breath: Secondary | ICD-10-CM | POA: Diagnosis not present

## 2018-04-20 LAB — PULMONARY FUNCTION TEST

## 2018-04-20 MED ORDER — ROPINIROLE HCL 0.5 MG PO TABS: 0.5000 mg | ORAL_TABLET | Freq: Two times a day (BID) | ORAL | 3 refills | Status: DC

## 2018-04-21 NOTE — Procedures (Signed)
[place], [id number]  DATE OF SERVICE: April 20, 2018  Complete Pulmonary Function Testing Interpretation:  FINDINGS:  The forced vital capacity is moderately decreased.  The FEV1 is 1.05 L which is 45% of predicted and is severely decreased.  Postbronchodilator there is borderline significant improvement in the FEV1.  FEV1 FVC ratio is mildly decreased.  Total lung capacity is normal and residual volume is increased residual volume total lung capacity ratio is increased DLCO is increased  IMPRESSION:  This pulmonary function study is consistent with severe obstructive lung disease with borderline response to bronchodilators.  Clinical correlation recommended  Allyne Gee, MD [facility] Pulmonary Critical Care Medicine Sleep Medicine

## 2018-04-22 ENCOUNTER — Encounter: Payer: Self-pay | Admitting: Nurse Practitioner

## 2018-04-22 ENCOUNTER — Ambulatory Visit (INDEPENDENT_AMBULATORY_CARE_PROVIDER_SITE_OTHER): Payer: Medicare Other | Admitting: Nurse Practitioner

## 2018-04-22 VITALS — BP 140/86 | HR 92 | Resp 16 | Ht 63.0 in | Wt 170.6 lb

## 2018-04-22 DIAGNOSIS — G63 Polyneuropathy in diseases classified elsewhere: Secondary | ICD-10-CM | POA: Diagnosis not present

## 2018-04-22 DIAGNOSIS — K2270 Barrett's esophagus without dysplasia: Secondary | ICD-10-CM

## 2018-04-22 DIAGNOSIS — G43919 Migraine, unspecified, intractable, without status migrainosus: Secondary | ICD-10-CM | POA: Diagnosis not present

## 2018-04-22 DIAGNOSIS — J4541 Moderate persistent asthma with (acute) exacerbation: Secondary | ICD-10-CM

## 2018-04-22 DIAGNOSIS — R110 Nausea: Secondary | ICD-10-CM | POA: Diagnosis not present

## 2018-04-22 DIAGNOSIS — M545 Low back pain, unspecified: Secondary | ICD-10-CM

## 2018-04-22 DIAGNOSIS — Z124 Encounter for screening for malignant neoplasm of cervix: Secondary | ICD-10-CM | POA: Insufficient documentation

## 2018-04-22 DIAGNOSIS — G43909 Migraine, unspecified, not intractable, without status migrainosus: Secondary | ICD-10-CM | POA: Insufficient documentation

## 2018-04-22 DIAGNOSIS — Z1239 Encounter for other screening for malignant neoplasm of breast: Secondary | ICD-10-CM

## 2018-04-22 MED ORDER — CYCLOBENZAPRINE HCL 10 MG PO TABS: 10.0000 mg | ORAL_TABLET | Freq: Two times a day (BID) | ORAL | 1 refills | Status: DC | PRN

## 2018-04-22 MED ORDER — GABAPENTIN 600 MG PO TABS: 600.0000 mg | ORAL_TABLET | Freq: Two times a day (BID) | ORAL | 3 refills | Status: DC

## 2018-04-22 MED ORDER — MONTELUKAST SODIUM 10 MG PO TABS: 10.0000 mg | ORAL_TABLET | Freq: Every day | ORAL | 3 refills | Status: DC

## 2018-04-22 MED ORDER — ONDANSETRON HCL 8 MG PO TABS: 8.0000 mg | ORAL_TABLET | Freq: Three times a day (TID) | ORAL | 2 refills | Status: DC | PRN

## 2018-04-22 MED ORDER — OMEPRAZOLE 40 MG PO CPDR: 40.0000 mg | DELAYED_RELEASE_CAPSULE | Freq: Every day | ORAL | 3 refills | Status: DC

## 2018-04-22 MED ORDER — SUMATRIPTAN SUCCINATE 100 MG PO TABS: 100.0000 mg | ORAL_TABLET | ORAL | 3 refills | Status: DC | PRN

## 2018-04-22 NOTE — Progress Notes (Signed)
[facility] [place], [place] [address]  Internal MEDICINE  Office Visit Note  Patient Name: Summer Murphy  [id number]  [id number]  Date of Service: 04/22/2018  Chief Complaint  Patient presents with  . Medication Refill  . Follow-up    The patient is here for follow up visit. She states that she needs to have refills of several medication. Her pharmacy closed and she needs new prescriptions to go to new pharmacy to be filled. Today, she is complaining of nasal allergies. Currently taking benadryl every 4 to 6 hours. This helps a little and does not make her sleepy. She usually takes singulair as well, however, she has been out of this for a while.  She has history of significant anxiety and depression. She is seeing psychiatry. Recently, she ran out of her "nerve" medications because she did not follow up. She had to go to emergency department to get symptoms under control. She has since, seen her psychiatrist and is back on routine visits.  She does get migraine headaches. Will take imitrex 100mg  orally when needed. This works. She also takes ondansetron to help the nausea she gets with her headaches.       Current Medication: Outpatient Encounter Medications as of 04/22/2018  Medication Sig Note  . ALPRAZolam (XANAX) 0.25 MG tablet Take 0.25 mg by mouth 2 (two) times daily as needed for anxiety.   Marland Kitchen amitriptyline (ELAVIL) 50 MG tablet Take 50 mg by mouth daily.   Marland Kitchen azelastine (ASTELIN) 0.1 % nasal spray once as needed.   . Brexpiprazole (REXULTI) 4 MG TABS Take 4 mg by mouth at bedtime.    . budesonide-formoterol (SYMBICORT) 160-4.5 MCG/ACT inhaler Inhale 2 puffs into the lungs 2 (two) times daily as needed (for respiratory issues.).    Marland Kitchen butalbital-acetaminophen-caffeine (FIORICET, ESGIC) 50-325-40 MG tablet Take 1 tablet by mouth every 4 (four) hours as needed (FOR MIGRAINE HEADACHES).    . cetirizine (ZYRTEC) 10 MG tablet Take 10 mg by mouth daily as  needed for allergies.    . citalopram (CELEXA) 10 MG tablet Take by mouth.   . COMBIVENT RESPIMAT 20-100 MCG/ACT AERS respimat INHALE 1 PUFF INTO THE LUNGS EVERY 6(SIX) HOURS.   . cyclobenzaprine (FLEXERIL) 10 MG tablet Take 1 tablet (10 mg total) by mouth 2 (two) times daily as needed for muscle spasms.   . cyproheptadine (PERIACTIN) 4 MG tablet Take 1 tablet (4 mg total) by mouth 3 (three) times daily as needed for allergies.   Marland Kitchen diclofenac sodium (VOLTAREN) 1 % GEL Apply 2 g topically 2 (two) times daily as needed (for pain).    Marland Kitchen diltiazem (DILACOR XR) 180 MG 24 hr capsule Take 1 cap by po daily   . diphenhydrAMINE (BENADRYL) 25 MG tablet Take 50 mg by mouth 2 (two) times daily as needed.    . DULoxetine (CYMBALTA) 30 MG capsule Take 30 mg by mouth at bedtime. 07/22/2016: TOTAL DOSE=90 MG  . estrogens, conjugated, (PREMARIN) 0.45 MG tablet Take 0.45 mg by mouth at bedtime. Reported on 09/09/2015   . gabapentin (NEURONTIN) 600 MG tablet Take 1 tablet (600 mg total) by mouth 2 (two) times daily.   Marland Kitchen glucose 4 GM chewable tablet Chew 1-2 tablets by mouth 3 (three) times daily as needed for low blood sugar.   . HYDROcodone-acetaminophen (NORCO) 5-325 MG tablet Take 1 tablet by mouth every 4 (four) hours as needed for moderate pain.   . hydrOXYzine (ATARAX/VISTARIL) 25 MG tablet Take  25 mg by mouth 3 (three) times daily as needed (FOR ITCHING).   Marland Kitchen ipratropium-albuterol (DUONEB) 0.5-2.5 (3) MG/3ML SOLN Take 3 mLs by nebulization every 4 (four) hours as needed (for shortness of breath/wheezing).    . LORazepam (ATIVAN) 1 MG tablet Take 1 tablet (1 mg total) by mouth 2 (two) times daily as needed for anxiety. (Patient taking differently: Take 1 mg by mouth every 8 (eight) hours as needed for anxiety. )   . montelukast (SINGULAIR) 10 MG tablet Take 1 tablet (10 mg total) by mouth at bedtime.   Marland Kitchen omeprazole (PRILOSEC) 40 MG capsule Take 1 capsule (40 mg total) by mouth daily.   . ondansetron (ZOFRAN) 8 MG  tablet Take 1 tablet (8 mg total) by mouth 3 (three) times daily as needed for nausea or vomiting.   Marland Kitchen oxybutynin (DITROPAN) 5 MG tablet Take 1 tablet (5 mg total) by mouth 2 (two) times daily.   . predniSONE (STERAPRED UNI-PAK 21 TAB) 10 MG (21) TBPK tablet As directed   . ranitidine (ZANTAC) 150 MG tablet Take 1 tablet (150 mg total) by mouth 2 (two) times daily.   Marland Kitchen rOPINIRole (REQUIP) 0.5 MG tablet Take 1 tablet (0.5 mg total) by mouth 2 (two) times daily. IF NEEDED FOR RESTLESS LEGS--NORMALLY JUST AT BEDTIME   . SUMAtriptan (IMITREX) 100 MG tablet Take 1 tablet (100 mg total) by mouth every 2 (two) hours as needed (for migraine headaches.). May repeat in 1 hours if headache persists or recurs.   . tamsulosin (FLOMAX) 0.4 MG CAPS capsule Take by mouth.   . tiotropium (SPIRIVA) 18 MCG inhalation capsule Place 18 mcg into inhaler and inhale daily as needed (for respiratory issues.).    Marland Kitchen traMADol (ULTRAM) 50 MG tablet Take 50-100 mg by mouth every 6 (six) hours as needed (FOR PAIN.). 07/22/2016: TYPICALLY TAKES 1 TABLET TWICE DAILY  . valACYclovir (VALTREX) 500 MG tablet Take 500 mg by mouth daily as needed (for fever blisters).   . Vitamin D, Ergocalciferol, (DRISDOL) 50000 units CAPS capsule Take 50,000 Units by mouth every Monday.   . [DISCONTINUED] cyclobenzaprine (FLEXERIL) 10 MG tablet Take 1 tablet (10 mg total) by mouth 2 (two) times daily as needed for muscle spasms.   . [DISCONTINUED] gabapentin (NEURONTIN) 600 MG tablet Take 600 mg by mouth 2 (two) times daily.   . [DISCONTINUED] levofloxacin (LEVAQUIN) 500 MG tablet Take 1 tablet (500 mg total) by mouth daily.   . [DISCONTINUED] methylPREDNISolone (MEDROL) 4 MG TBPK tablet Take by mouth as directed for 6 days   . [DISCONTINUED] montelukast (SINGULAIR) 10 MG tablet Take 1 tablet (10 mg total) by mouth at bedtime.   . [DISCONTINUED] omeprazole (PRILOSEC) 40 MG capsule TAKE ONE CAPSULE EVERY DAY   . [DISCONTINUED] ondansetron (ZOFRAN) 8 MG  tablet Take 1 tablet (8 mg total) by mouth 3 (three) times daily as needed for nausea or vomiting.   . [DISCONTINUED] SUMAtriptan (IMITREX) 100 MG tablet Take 100 mg by mouth every 2 (two) hours as needed (for migraine headaches.). May repeat in 1 hours if headache persists or recurs.   . DULoxetine (CYMBALTA) 60 MG capsule Take 1 capsule (60 mg total) by mouth daily for 14 days.   . traZODone (DESYREL) 150 MG tablet Take 1 tablet (150 mg total) by mouth at bedtime for 14 days.    No facility-administered encounter medications on file as of 04/22/2018.     Surgical History: Past Surgical History:  Procedure Laterality Date  . ABDOMINAL  HYSTERECTOMY    . AMPUTATION TOE Left 07/31/2016   Procedure: AMPUTATION TOE/MPJ 2nd toe;  Surgeon: Sharlotte Alamo, DPM;  Location: [facility] ORS;  Service: Podiatry;  Laterality: Left;  . APPENDECTOMY  1990  . BACK SURGERY    . BREAST SURGERY     bilateral breast reduction  . CARDIAC ELECTROPHYSIOLOGY STUDY AND ABLATION    . CHOLECYSTECTOMY  1990  . COLONOSCOPY WITH PROPOFOL N/A 01/04/2017   Procedure: COLONOSCOPY WITH PROPOFOL;  Surgeon: Manya Silvas, MD;  Location: [facility] ENDOSCOPY;  Service: Endoscopy;  Laterality: N/A;  . CORNEAL TRANSPLANT    . ESOPHAGOGASTRODUODENOSCOPY (EGD) WITH PROPOFOL N/A 01/04/2017   Procedure: ESOPHAGOGASTRODUODENOSCOPY (EGD) WITH PROPOFOL;  Surgeon: Manya Silvas, MD;  Location: [facility] ENDOSCOPY;  Service: Endoscopy;  Laterality: N/A;  . EXCISION BONE CYST Left 07/31/2016   Procedure: EXCISION BONE CYST/exostectomy 28124/left 2nd;  Surgeon: Sharlotte Alamo, DPM;  Location: [facility] ORS;  Service: Podiatry;  Laterality: Left;  . EXTRACORPOREAL SHOCK WAVE LITHOTRIPSY Left 09/12/2015   Procedure: EXTRACORPOREAL SHOCK WAVE LITHOTRIPSY (ESWL);  Surgeon: Hollice Espy, MD;  Location: [facility] ORS;  Service: Urology;  Laterality: Left;  . FRACTURE SURGERY     left foot  . HH repair    . JOINT REPLACEMENT Bilateral 2013,2014   total knees  .  LAPAROSCOPIC HYSTERECTOMY    . LITHOTRIPSY    . TONSILLECTOMY    . URETEROSCOPY      Medical History: Past Medical History:  Diagnosis Date  . Acid reflux   . Anxiety   . Arrhythmia    treated with meds and has no current problems  . Arthritis    most uncomfortable in knees  . Asthma    uses several inhalers  . Depression   . Diabetes mellitus without complication (HCC)    sugars run low, not high sugars  . Fever blister   . Hematuria   . Hypertension   . Kidney stone    stones, 2nd lithotripsy  . Left flank pain   . Migraine   . Restless leg   . Yeast vaginitis     Family History: Family History  Problem Relation Age of Onset  . Prostate cancer Father   . Kidney Stones Father   . Kidney disease Neg Hx     Social History   Socioeconomic History  . Marital status: Divorced    Spouse name: Not on file  . Number of children: Not on file  . Years of education: Not on file  . Highest education level: Not on file  Occupational History  . Not on file  Social Needs  . Financial resource strain: Not on file  . Food insecurity:    Worry: Not on file    Inability: Not on file  . Transportation needs:    Medical: Not on file    Non-medical: Not on file  Tobacco Use  . Smoking status: Never Smoker  . Smokeless tobacco: Never Used  Substance and Sexual Activity  . Alcohol use: No  . Drug use: No  . Sexual activity: Not on file  Lifestyle  . Physical activity:    Days per week: Not on file    Minutes per session: Not on file  . Stress: Not on file  Relationships  . Social connections:    Talks on phone: Not on file    Gets together: Not on file    Attends religious service: Not on file    Active member of club or organization: Not  on file    Attends meetings of clubs or organizations: Not on file    Relationship status: Not on file  . Intimate partner violence:    Fear of current or ex partner: Not on file    Emotionally abused: Not on file     Physically abused: Not on file    Forced sexual activity: Not on file  Other Topics Concern  . Not on file  Social History Narrative  . Not on file      Review of Systems  Constitutional: Positive for fatigue. Negative for chills and unexpected weight change.  HENT: Positive for congestion and postnasal drip. Negative for rhinorrhea, sneezing and sore throat.   Eyes: Positive for visual disturbance.  Respiratory: Positive for wheezing. Negative for cough, chest tightness and shortness of breath.   Cardiovascular: Negative for chest pain and palpitations.  Gastrointestinal: Negative for abdominal pain, constipation, diarrhea, nausea and vomiting.  Endocrine: Negative for cold intolerance, heat intolerance, polydipsia and polyuria.  Musculoskeletal: Positive for arthralgias and myalgias. Negative for back pain, joint swelling and neck pain.  Skin: Negative for rash.  Allergic/Immunologic: Negative for environmental allergies.  Neurological: Positive for headaches. Negative for tremors and numbness.  Hematological: Negative for adenopathy. Does not bruise/bleed easily.  Psychiatric/Behavioral: Positive for dysphoric mood. Negative for behavioral problems (Depression), sleep disturbance and suicidal ideas. The patient is nervous/anxious.        The patient is routiely seeing psychiatry    Today's Vitals   04/22/18 1342  BP: 140/86  Pulse: 92  Resp: 16  SpO2: 96%  Weight: 170 lb 9.6 oz (77.4 kg)  Height: 5\' 3"  (1.6 m)   Body mass index is 30.22 kg/m.  Physical Exam Vitals signs and nursing note reviewed.  Constitutional:      General: She is not in acute distress.    Appearance: Normal appearance. She is well-developed. She is not diaphoretic.  HENT:     Head: Normocephalic and atraumatic.     Nose: Congestion present.     Mouth/Throat:     Pharynx: No oropharyngeal exudate.  Eyes:     Pupils: Pupils are equal, round, and reactive to light.  Neck:     Musculoskeletal:  Normal range of motion and neck supple.     Thyroid: No thyromegaly.     Vascular: No carotid bruit or JVD.     Trachea: No tracheal deviation.  Cardiovascular:     Rate and Rhythm: Normal rate and regular rhythm.     Heart sounds: Normal heart sounds. No murmur. No friction rub. No gallop.   Pulmonary:     Effort: Pulmonary effort is normal. No respiratory distress.     Breath sounds: Normal breath sounds. No wheezing or rales.  Chest:     Chest wall: No tenderness.  Abdominal:     General: Bowel sounds are normal.     Palpations: Abdomen is soft.  Musculoskeletal: Normal range of motion.  Lymphadenopathy:     Cervical: No cervical adenopathy.  Skin:    General: Skin is warm and dry.  Neurological:     Mental Status: She is alert and oriented to person, place, and time. Mental status is at baseline.     Cranial Nerves: No cranial nerve deficit.  Psychiatric:        Behavior: Behavior normal.        Thought Content: Thought content normal.        Judgment: Judgment normal.   Assessment/Plan: 1. Moderate persistent  asthma with acute exacerbation Generally well controlled. Continue inhalers and respiratory medications as prescribed. Refilled singulair today  - montelukast (SINGULAIR) 10 MG tablet; Take 1 tablet (10 mg total) by mouth at bedtime.  Dispense: 30 tablet; Refill: 3  2. Intractable migraine without status migrainosus, unspecified migraine type May take imitrex as needed for acute migraines. Refills sent to her pharmacy - SUMAtriptan (IMITREX) 100 MG tablet; Take 1 tablet (100 mg total) by mouth every 2 (two) hours as needed (for migraine headaches.). May repeat in 1 hours if headache persists or recurs.  Dispense: 10 tablet; Refill: 3  3. Nausea - ondansetron (ZOFRAN) 8 MG tablet; Take 1 tablet (8 mg total) by mouth 3 (three) times daily as needed for nausea or vomiting.  Dispense: 45 tablet; Refill: 2  4. Barrett's esophagus without dysplasia - omeprazole (PRILOSEC)  40 MG capsule; Take 1 capsule (40 mg total) by mouth daily.  Dispense: 30 capsule; Refill: 3  5. Polyneuropathy associated with underlying disease ([place]) - gabapentin (NEURONTIN) 600 MG tablet; Take 1 tablet (600 mg total) by mouth 2 (two) times daily.  Dispense: 60 tablet; Refill: 3  6. Midline low back pain without sciatica, unspecified chronicity - cyclobenzaprine (FLEXERIL) 10 MG tablet; Take 1 tablet (10 mg total) by mouth 2 (two) times daily as needed for muscle spasms.  Dispense: 60 tablet; Refill: 1  7. Screening for breast cancer - MM DIGITAL SCREENING BILATERAL; Future  General Counseling: Nallely verbalizes understanding of the findings of todays visit and agrees with plan of treatment. I have discussed any further diagnostic evaluation that may be needed or ordered today. We also reviewed her medications today. she has been encouraged to call the office with any questions or concerns that should arise related to todays visit.  Counseling: Adherence of Medical Therapy: The patient understands that it is the responsibility of the patient to complete all prescribed medications, all recommended testing, including but not limited to, laboratory studies and imaging. The patient further understands the need to keep all scheduled follow-up visits and to inform the office immediately of any changes in their medical condition. The patient understands that the success of treatment in large part depends on the patient's willingness to complete the therapeutic regimen and to work in partnership with the designated health-care providers.  This patient was seen by Leretha Pol FNP Collaboration with Dr Lavera Guise as a part of collaborative care agreement  Orders Placed This Encounter  Procedures  . MM DIGITAL SCREENING BILATERAL    Meds ordered this encounter  Medications  . cyclobenzaprine (FLEXERIL) 10 MG tablet    Sig: Take 1 tablet (10 mg total) by mouth 2 (two) times daily as needed for  muscle spasms.    Dispense:  60 tablet    Refill:  1    Order Specific Question:   Supervising Provider    Answer:   Lavera Guise [[id number]]  . gabapentin (NEURONTIN) 600 MG tablet    Sig: Take 1 tablet (600 mg total) by mouth 2 (two) times daily.    Dispense:  60 tablet    Refill:  3    Order Specific Question:   Supervising Provider    Answer:   Lavera Guise [[id number]]  . montelukast (SINGULAIR) 10 MG tablet    Sig: Take 1 tablet (10 mg total) by mouth at bedtime.    Dispense:  30 tablet    Refill:  3    Order Specific Question:   Supervising Provider  Answer:   Lavera Guise [place]  . omeprazole (PRILOSEC) 40 MG capsule    Sig: Take 1 capsule (40 mg total) by mouth daily.    Dispense:  30 capsule    Refill:  3    Order Specific Question:   Supervising Provider    Answer:   Lavera Guise [[id number]]  . ondansetron (ZOFRAN) 8 MG tablet    Sig: Take 1 tablet (8 mg total) by mouth 3 (three) times daily as needed for nausea or vomiting.    Dispense:  45 tablet    Refill:  2    Order Specific Question:   Supervising Provider    Answer:   Lavera Guise [[id number]]  . SUMAtriptan (IMITREX) 100 MG tablet    Sig: Take 1 tablet (100 mg total) by mouth every 2 (two) hours as needed (for migraine headaches.). May repeat in 1 hours if headache persists or recurs.    Dispense:  10 tablet    Refill:  3    Order Specific Question:   Supervising Provider    Answer:   Lavera Guise [[id number]]    Time spent: 58 Minutes      Dr Lavera Guise Internal medicine

## 2018-05-02 ENCOUNTER — Encounter: Payer: Self-pay | Admitting: Adult Health

## 2018-05-02 ENCOUNTER — Ambulatory Visit (INDEPENDENT_AMBULATORY_CARE_PROVIDER_SITE_OTHER): Payer: Medicare Other | Admitting: Adult Health

## 2018-05-02 VITALS — BP 146/92 | HR 106 | Resp 16 | Ht 63.0 in | Wt 175.0 lb

## 2018-05-02 DIAGNOSIS — I10 Essential (primary) hypertension: Secondary | ICD-10-CM

## 2018-05-02 DIAGNOSIS — J449 Chronic obstructive pulmonary disease, unspecified: Secondary | ICD-10-CM | POA: Diagnosis not present

## 2018-05-02 DIAGNOSIS — J45909 Unspecified asthma, uncomplicated: Secondary | ICD-10-CM

## 2018-05-02 MED ORDER — BUDESONIDE-FORMOTEROL FUMARATE 160-4.5 MCG/ACT IN AERO: 2.0000 | INHALATION_SPRAY | Freq: Two times a day (BID) | RESPIRATORY_TRACT | 3 refills | Status: DC | PRN

## 2018-05-02 MED ORDER — TIOTROPIUM BROMIDE MONOHYDRATE 2.5 MCG/ACT IN AERS: 2.5000 ug | INHALATION_SPRAY | Freq: Two times a day (BID) | RESPIRATORY_TRACT | 3 refills | Status: DC

## 2018-05-02 NOTE — Progress Notes (Signed)
[facility] [place], [place] [address]  Pulmonary Sleep Medicine   Office Visit Note  Patient Name: Summer Murphy DOB: 1955-09-11 MRN [id number]  Date of Service: 06/22/2018  Complaints/HPI: PT here for follow up on COPD, and asthma.  She reports she is using her Combivent inhaler multiple times a day.  She does not use her Symbicort and Spiriva like she should, and she reports she is working on her compliance. She is requesting refills on her symbicort and Spiriva.   ROS  General: (-) fever, (-) chills, (-) night sweats, (-) weakness Skin: (-) rashes, (-) itching,. Eyes: (-) visual changes, (-) redness, (-) itching. Nose and Sinuses: (-) nasal stuffiness or itchiness, (-) postnasal drip, (-) nosebleeds, (-) sinus trouble. Mouth and Throat: (-) sore throat, (-) hoarseness. Neck: (-) swollen glands, (-) enlarged thyroid, (-) neck pain. Respiratory: - cough, (-) bloody sputum, - shortness of breath, - wheezing. Cardiovascular: - ankle swelling, (-) chest pain. Lymphatic: (-) lymph node enlargement. Neurologic: (-) numbness, (-) tingling. Psychiatric: (-) anxiety, (-) depression   Current Medication: Outpatient Encounter Medications as of 05/02/2018  Medication Sig Note  . ALPRAZolam (XANAX) 0.25 MG tablet Take 0.25 mg by mouth 2 (two) times daily as needed for anxiety.   Marland Kitchen amitriptyline (ELAVIL) 50 MG tablet Take 50 mg by mouth daily.   Marland Kitchen azelastine (ASTELIN) 0.1 % nasal spray once as needed.   . Brexpiprazole (REXULTI) 4 MG TABS Take 4 mg by mouth at bedtime.    . budesonide-formoterol (SYMBICORT) 160-4.5 MCG/ACT inhaler Inhale 2 puffs into the lungs 2 (two) times daily as needed (for respiratory issues.).   Marland Kitchen COMBIVENT RESPIMAT 20-100 MCG/ACT AERS respimat INHALE 1 PUFF INTO THE LUNGS EVERY 6(SIX) HOURS.   . cyclobenzaprine (FLEXERIL) 10 MG tablet Take 1 tablet (10 mg total) by mouth 2 (two) times daily as needed for muscle spasms.   . diclofenac  sodium (VOLTAREN) 1 % GEL Apply 2 g topically 2 (two) times daily as needed (for pain).    Marland Kitchen diphenhydrAMINE (BENADRYL) 25 MG tablet Take 50 mg by mouth 2 (two) times daily as needed.    . DULoxetine (CYMBALTA) 30 MG capsule Take 30 mg by mouth at bedtime. 07/22/2016: TOTAL DOSE=90 MG  . gabapentin (NEURONTIN) 600 MG tablet Take 1 tablet (600 mg total) by mouth 2 (two) times daily.   Marland Kitchen HYDROcodone-acetaminophen (NORCO) 5-325 MG tablet Take 1 tablet by mouth every 4 (four) hours as needed for moderate pain.   Marland Kitchen ipratropium-albuterol (DUONEB) 0.5-2.5 (3) MG/3ML SOLN Take 3 mLs by nebulization every 4 (four) hours as needed (for shortness of breath/wheezing).    . LORazepam (ATIVAN) 1 MG tablet Take 1 tablet (1 mg total) by mouth 2 (two) times daily as needed for anxiety. (Patient taking differently: Take 1 mg by mouth every 8 (eight) hours as needed for anxiety. )   . montelukast (SINGULAIR) 10 MG tablet Take 1 tablet (10 mg total) by mouth at bedtime.   Marland Kitchen omeprazole (PRILOSEC) 40 MG capsule Take 1 capsule (40 mg total) by mouth daily.   . ondansetron (ZOFRAN) 8 MG tablet Take 1 tablet (8 mg total) by mouth 3 (three) times daily as needed for nausea or vomiting.   Marland Kitchen rOPINIRole (REQUIP) 0.5 MG tablet Take 1 tablet (0.5 mg total) by mouth 2 (two) times daily. IF NEEDED FOR RESTLESS LEGS--NORMALLY JUST AT BEDTIME   . SUMAtriptan (IMITREX) 100 MG tablet Take 1 tablet (100 mg total) by mouth every  2 (two) hours as needed (for migraine headaches.). May repeat in 1 hours if headache persists or recurs.   . tamsulosin (FLOMAX) 0.4 MG CAPS capsule Take by mouth.   . valACYclovir (VALTREX) 500 MG tablet Take 500 mg by mouth daily as needed (for fever blisters).   . [DISCONTINUED] budesonide-formoterol (SYMBICORT) 160-4.5 MCG/ACT inhaler Inhale 2 puffs into the lungs 2 (two) times daily as needed (for respiratory issues.).    . [DISCONTINUED] butalbital-acetaminophen-caffeine (FIORICET, ESGIC) 50-325-40 MG tablet  Take 1 tablet by mouth every 4 (four) hours as needed (FOR MIGRAINE HEADACHES).    . [DISCONTINUED] cetirizine (ZYRTEC) 10 MG tablet Take 10 mg by mouth daily as needed for allergies.    . [DISCONTINUED] citalopram (CELEXA) 10 MG tablet Take by mouth.   . [DISCONTINUED] cyproheptadine (PERIACTIN) 4 MG tablet Take 1 tablet (4 mg total) by mouth 3 (three) times daily as needed for allergies. (Patient not taking: Reported on 05/05/2018)   . [DISCONTINUED] diltiazem (DILACOR XR) 180 MG 24 hr capsule Take 1 cap by po daily (Patient not taking: Reported on 05/05/2018)   . [DISCONTINUED] estrogens, conjugated, (PREMARIN) 0.45 MG tablet Take 0.45 mg by mouth at bedtime. Reported on 09/09/2015   . [DISCONTINUED] glucose 4 GM chewable tablet Chew 1-2 tablets by mouth 3 (three) times daily as needed for low blood sugar.   . [DISCONTINUED] hydrOXYzine (ATARAX/VISTARIL) 25 MG tablet Take 25 mg by mouth 3 (three) times daily as needed (FOR ITCHING).   . [DISCONTINUED] oxybutynin (DITROPAN) 5 MG tablet Take 1 tablet (5 mg total) by mouth 2 (two) times daily.   . [DISCONTINUED] predniSONE (STERAPRED UNI-PAK 21 TAB) 10 MG (21) TBPK tablet As directed (Patient not taking: Reported on 05/05/2018)   . [DISCONTINUED] ranitidine (ZANTAC) 150 MG tablet Take 1 tablet (150 mg total) by mouth 2 (two) times daily. (Patient not taking: Reported on 05/05/2018)   . [DISCONTINUED] tiotropium (SPIRIVA) 18 MCG inhalation capsule Place 18 mcg into inhaler and inhale daily as needed (for respiratory issues.).    . [DISCONTINUED] traMADol (ULTRAM) 50 MG tablet Take 50-100 mg by mouth every 6 (six) hours as needed (FOR PAIN.). 07/22/2016: TYPICALLY TAKES 1 TABLET TWICE DAILY  . [DISCONTINUED] Vitamin D, Ergocalciferol, (DRISDOL) 50000 units CAPS capsule Take 50,000 Units by mouth every Monday.   . DULoxetine (CYMBALTA) 60 MG capsule Take 1 capsule (60 mg total) by mouth daily for 14 days.   . Tiotropium Bromide Monohydrate (SPIRIVA RESPIMAT)  2.5 MCG/ACT AERS Inhale 2.5 mcg into the lungs 2 (two) times daily.   . traZODone (DESYREL) 150 MG tablet Take 1 tablet (150 mg total) by mouth at bedtime for 14 days.    No facility-administered encounter medications on file as of 05/02/2018.     Surgical History: Past Surgical History:  Procedure Laterality Date  . ABDOMINAL HYSTERECTOMY    . AMPUTATION TOE Left 07/31/2016   Procedure: AMPUTATION TOE/MPJ 2nd toe;  Surgeon: Sharlotte Alamo, DPM;  Location: [facility] ORS;  Service: Podiatry;  Laterality: Left;  . APPENDECTOMY  1990  . BACK SURGERY    . BREAST SURGERY     bilateral breast reduction  . CARDIAC ELECTROPHYSIOLOGY STUDY AND ABLATION    . CHOLECYSTECTOMY  1990  . COLONOSCOPY WITH PROPOFOL N/A 01/04/2017   Procedure: COLONOSCOPY WITH PROPOFOL;  Surgeon: Manya Silvas, MD;  Location: [facility] ENDOSCOPY;  Service: Endoscopy;  Laterality: N/A;  . CORNEAL TRANSPLANT    . ESOPHAGOGASTRODUODENOSCOPY (EGD) WITH PROPOFOL N/A 01/04/2017   Procedure: ESOPHAGOGASTRODUODENOSCOPY (EGD)  WITH PROPOFOL;  Surgeon: Manya Silvas, MD;  Location: [facility] ENDOSCOPY;  Service: Endoscopy;  Laterality: N/A;  . EXCISION BONE CYST Left 07/31/2016   Procedure: EXCISION BONE CYST/exostectomy 28124/left 2nd;  Surgeon: Sharlotte Alamo, DPM;  Location: [facility] ORS;  Service: Podiatry;  Laterality: Left;  . EXTRACORPOREAL SHOCK WAVE LITHOTRIPSY Left 09/12/2015   Procedure: EXTRACORPOREAL SHOCK WAVE LITHOTRIPSY (ESWL);  Surgeon: Hollice Espy, MD;  Location: [facility] ORS;  Service: Urology;  Laterality: Left;  . FRACTURE SURGERY     left foot  . HH repair    . JOINT REPLACEMENT Bilateral 2013,2014   total knees  . LAPAROSCOPIC HYSTERECTOMY    . LITHOTRIPSY    . TONSILLECTOMY    . URETEROSCOPY      Medical History: Past Medical History:  Diagnosis Date  . Acid reflux   . Anxiety   . Arrhythmia    treated with meds and has no current problems  . Arthritis    most uncomfortable in knees  . Asthma    uses several  inhalers  . Depression   . Diabetes mellitus without complication (HCC)    sugars run low, not high sugars  . Fever blister   . Hematuria   . Hypertension   . Kidney stone    stones, 2nd lithotripsy  . Left flank pain   . Migraine   . Restless leg   . Yeast vaginitis     Family History: Family History  Problem Relation Age of Onset  . Prostate cancer Father   . Kidney Stones Father   . Kidney disease Neg Hx     Social History: Social History   Socioeconomic History  . Marital status: Divorced    Spouse name: Not on file  . Number of children: Not on file  . Years of education: Not on file  . Highest education level: Not on file  Occupational History  . Not on file  Social Needs  . Financial resource strain: Not on file  . Food insecurity:    Worry: Not on file    Inability: Not on file  . Transportation needs:    Medical: Not on file    Non-medical: Not on file  Tobacco Use  . Smoking status: Never Smoker  . Smokeless tobacco: Never Used  Substance and Sexual Activity  . Alcohol use: No  . Drug use: No  . Sexual activity: Not on file  Lifestyle  . Physical activity:    Days per week: Not on file    Minutes per session: Not on file  . Stress: Not on file  Relationships  . Social connections:    Talks on phone: Not on file    Gets together: Not on file    Attends religious service: Not on file    Active member of club or organization: Not on file    Attends meetings of clubs or organizations: Not on file    Relationship status: Not on file  . Intimate partner violence:    Fear of current or ex partner: Not on file    Emotionally abused: Not on file    Physically abused: Not on file    Forced sexual activity: Not on file  Other Topics Concern  . Not on file  Social History Narrative  . Not on file    Vital Signs: Blood pressure (!) 146/92, pulse (!) 106, resp. rate 16, height 5\' 3"  (1.6 m), weight 175 lb (79.4 kg), SpO2 96  %.  Examination:  General Appearance: The patient is well-developed, well-nourished, and in no distress. Skin: Gross inspection of skin unremarkable. Head: normocephalic, no gross deformities. Eyes: no gross deformities noted. ENT: ears appear grossly normal no exudates. Neck: Supple. No thyromegaly. No LAD. Respiratory: clear, diminished in bases, with faint expiratory wheeze bilaterally. Cardiovascular: Normal S1 and S2 without murmur or rub. Extremities: No cyanosis. pulses are equal. Neurologic: Alert and oriented. No involuntary movements.  LABS: Recent Results (from the past 2160 hour(s))  Pulmonary function test     Status: None   Collection Time: 04/20/18  2:30 PM  Result Value Ref Range   FEV1     FVC     FEV1/FVC     TLC     DLCO      Radiology: No results found.  No results found.  No results found.    Assessment and Plan: Patient Active Problem List   Diagnosis Date Noted  . Intractable migraine without status migrainosus 04/22/2018  . Nausea 04/22/2018  . Polyneuropathy associated with underlying disease ([place]) 04/22/2018  . Midline low back pain without sciatica 04/22/2018  . Screening for breast cancer 04/22/2018  . Hypertension 07/01/2017  . Acute bronchitis with asthma 07/01/2017  . Allergic rhinitis 07/01/2017  . Asthma 01/20/2016  . Severe recurrent major depression without psychotic features ([place]) 09/10/2014  . COPD (chronic obstructive pulmonary disease) ([place]) 09/10/2014  . Calculi, ureter 04/26/2013  . Corneal graft malfunction 08/17/2012  . Calculus of kidney 04/26/2012  . Renal colic 67/54/4920  . Urge incontinence 04/26/2012  . Diaphragmatic hernia 10/27/2010  . Barrett esophagus 07/07/2010   1. Chronic obstructive pulmonary disease, unspecified COPD type ([place]) Pt given RX for Spiriva, and Symbicort per her request.  Encouraged continued compliance.   - Tiotropium Bromide Monohydrate (SPIRIVA RESPIMAT) 2.5 MCG/ACT AERS; Inhale 2.5 mcg  into the lungs 2 (two) times daily.  Dispense: 1 Inhaler; Refill: 3 - budesonide-formoterol (SYMBICORT) 160-4.5 MCG/ACT inhaler; Inhale 2 puffs into the lungs 2 (two) times daily as needed (for respiratory issues.).  Dispense: 1 Inhaler; Refill: 3  2. Moderate asthma without complication, unspecified whether persistent Continue to use combivent as needed.  3. Essential hypertension Slightly elevated today.  Continue present management.  Pt reports she is always nervous when coming to the office.  Will continue to monitor.  She denies any chest pain, SOB, or palpitations.    General Counseling: I have discussed the findings of the evaluation and examination with Rendi.  I have also discussed any further diagnostic evaluation thatmay be needed or ordered today. Azelea verbalizes understanding of the findings of todays visit. We also reviewed her medications today and discussed drug interactions and side effects including but not limited excessive drowsiness and altered mental states. We also discussed that there is always a risk not just to her but also people around her. she has been encouraged to call the office with any questions or concerns that should arise related to todays visit.    Time spent: 25 This patient was seen by Orson Gear AGNP-C in Collaboration with Dr. Devona Konig as a part of collaborative care agreement.   I have personally obtained a history, examined the patient, evaluated laboratory and imaging results, formulated the assessment and plan and placed orders.    Allyne Gee, MD [facility] Pulmonary and Critical Care Sleep medicine

## 2018-05-02 NOTE — Patient Instructions (Signed)
Chronic Obstructive Pulmonary Disease Chronic obstructive pulmonary disease (COPD) is a long-term (chronic) lung problem. When you have COPD, it is hard for air to get in and out of your lungs. Usually the condition gets worse over time, and your lungs will never return to normal. There are things you can do to keep yourself as healthy as possible.  Your doctor may treat your condition with: ? Medicines. ? Oxygen. ? Lung surgery.  Your doctor may also recommend: ? Rehabilitation. This includes steps to make your body work better. It may involve a team of specialists. ? Quitting smoking, if you smoke. ? Exercise and changes to your diet. ? Comfort measures (palliative care). Follow these instructions at home: Medicines  Take over-the-counter and prescription medicines only as told by your doctor.  Talk to your doctor before taking any cough or allergy medicines. You may need to avoid medicines that cause your lungs to be dry. Lifestyle  If you smoke, stop. Smoking makes the problem worse. If you need help quitting, ask your doctor.  Avoid being around things that make your breathing worse. This may include smoke, chemicals, and fumes.  Stay active, but remember to rest as well.  Learn and use tips on how to relax.  Make sure you get enough sleep. Most adults need at least 7 hours of sleep every night.  Eat healthy foods. Eat smaller meals more often. Rest before meals. Controlled breathing Learn and use tips on how to control your breathing as told by your doctor. Try:  Breathing in (inhaling) through your nose for 1 second. Then, pucker your lips and breath out (exhale) through your lips for 2 seconds.  Putting one hand on your belly (abdomen). Breathe in slowly through your nose for 1 second. Your hand on your belly should move out. Pucker your lips and breathe out slowly through your lips. Your hand on your belly should move in as you breathe out.  Controlled coughing Learn  and use controlled coughing to clear mucus from your lungs. Follow these steps: 1. Lean your head a little forward. 2. Breathe in deeply. 3. Try to hold your breath for 3 seconds. 4. Keep your mouth slightly open while coughing 2 times. 5. Spit any mucus out into a tissue. 6. Rest and do the steps again 1 or 2 times as needed. General instructions  Make sure you get all the shots (vaccines) that your doctor recommends. Ask your doctor about a flu shot and a pneumonia shot.  Use oxygen therapy and pulmonary rehabilitation if told by your doctor. If you need home oxygen therapy, ask your doctor if you should buy a tool to measure your oxygen level (oximeter).  Make a COPD action plan with your doctor. This helps you to know what to do if you feel worse than usual.  Manage any other conditions you have as told by your doctor.  Avoid going outside when it is very hot, cold, or humid.  Avoid people who have a sickness you can catch (contagious).  Keep all follow-up visits as told by your doctor. This is important. Contact a doctor if:  You cough up more mucus than usual.  There is a change in the color or thickness of the mucus.  It is harder to breathe than usual.  Your breathing is faster than usual.  You have trouble sleeping.  You need to use your medicines more often than usual.  You have trouble doing your normal activities such as getting dressed   or walking around the house. Get help right away if:  You have shortness of breath while resting.  You have shortness of breath that stops you from: ? Being able to talk. ? Doing normal activities.  Your chest hurts for longer than 5 minutes.  Your skin color is more blue than usual.  Your pulse oximeter shows that you have low oxygen for longer than 5 minutes.  You have a fever.  You feel too tired to breathe normally. Summary  Chronic obstructive pulmonary disease (COPD) is a long-term lung problem.  The way your  lungs work will never return to normal. Usually the condition gets worse over time. There are things you can do to keep yourself as healthy as possible.  Take over-the-counter and prescription medicines only as told by your doctor.  If you smoke, stop. Smoking makes the problem worse. This information is not intended to replace advice given to you by your health care provider. Make sure you discuss any questions you have with your health care provider. Document Released: 08/26/2007 Document Revised: 04/13/2016 Document Reviewed: 04/13/2016 Elsevier Interactive Patient Education  2019 [employer]  

## 2018-05-05 ENCOUNTER — Ambulatory Visit (INDEPENDENT_AMBULATORY_CARE_PROVIDER_SITE_OTHER): Payer: Medicare Other | Admitting: Adult Health

## 2018-05-05 ENCOUNTER — Encounter: Payer: Self-pay | Admitting: Adult Health

## 2018-05-05 ENCOUNTER — Other Ambulatory Visit: Payer: Self-pay | Admitting: Adult Health

## 2018-05-05 VITALS — BP 112/82 | HR 93 | Temp 98.4°F | Resp 16 | Ht 63.0 in | Wt 172.0 lb

## 2018-05-05 DIAGNOSIS — I10 Essential (primary) hypertension: Secondary | ICD-10-CM | POA: Diagnosis not present

## 2018-05-05 DIAGNOSIS — J449 Chronic obstructive pulmonary disease, unspecified: Secondary | ICD-10-CM

## 2018-05-05 DIAGNOSIS — J4541 Moderate persistent asthma with (acute) exacerbation: Secondary | ICD-10-CM

## 2018-05-05 DIAGNOSIS — K219 Gastro-esophageal reflux disease without esophagitis: Secondary | ICD-10-CM

## 2018-05-05 MED ORDER — AMLODIPINE BESYLATE 5 MG PO TABS: 5.0000 mg | ORAL_TABLET | Freq: Every day | ORAL | 0 refills | Status: DC

## 2018-05-05 NOTE — Patient Instructions (Signed)

## 2018-05-05 NOTE — Progress Notes (Signed)
[facility] [place], [place] [address]  Internal MEDICINE  Office Visit Note  Patient Name: Summer Murphy  [id number]  [id number]  Date of Service: 05/05/2018  Chief Complaint  Patient presents with  . Hypertension    bp has been running high, bp medication is making sick dilt xr 180     HPI Pt is here for a sick visit. 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  They took her BP, and told her it was 158/102 with a HR of 95. She reports she is unable to take the Cardizem, because it makes her sick. She denies headache, sob or other symptoms.      Current Medication:  Outpatient Encounter Medications as of 05/05/2018  Medication Sig Note  . azelastine (ASTELIN) 0.1 % nasal spray once as needed.   . budesonide-formoterol (SYMBICORT) 160-4.5 MCG/ACT inhaler Inhale 2 puffs into the lungs 2 (two) times daily as needed (for respiratory issues.).   Marland Kitchen COMBIVENT RESPIMAT 20-100 MCG/ACT AERS respimat INHALE 1 PUFF INTO THE LUNGS EVERY 6(SIX) HOURS.   . cyclobenzaprine (FLEXERIL) 10 MG tablet Take 1 tablet (10 mg total) by mouth 2 (two) times daily as needed for muscle spasms.   . diclofenac sodium (VOLTAREN) 1 % GEL Apply 2 g topically 2 (two) times daily as needed (for pain).    Marland Kitchen diphenhydrAMINE (BENADRYL) 25 MG tablet Take 50 mg by mouth 2 (two) times daily as needed.    . DULoxetine (CYMBALTA) 30 MG capsule Take 30 mg by mouth at bedtime. 07/22/2016: TOTAL DOSE=90 MG  . gabapentin (NEURONTIN) 600 MG tablet Take 1 tablet (600 mg total) by mouth 2 (two) times daily.   Marland Kitchen ipratropium-albuterol (DUONEB) 0.5-2.5 (3) MG/3ML SOLN Take 3 mLs by nebulization every 4 (four) hours as needed (for shortness of breath/wheezing).    . LORazepam (ATIVAN) 1 MG tablet Take 1 tablet (1 mg total) by mouth 2 (two) times daily as needed for anxiety. (Patient taking differently: Take 1 mg by mouth every 8 (eight) hours as needed for anxiety. )   .  montelukast (SINGULAIR) 10 MG tablet Take 1 tablet (10 mg total) by mouth at bedtime.   Marland Kitchen omeprazole (PRILOSEC) 40 MG capsule Take 1 capsule (40 mg total) by mouth daily.   . ondansetron (ZOFRAN) 8 MG tablet Take 1 tablet (8 mg total) by mouth 3 (three) times daily as needed for nausea or vomiting.   Marland Kitchen oxybutynin (DITROPAN) 5 MG tablet Take 1 tablet (5 mg total) by mouth 2 (two) times daily.   Marland Kitchen rOPINIRole (REQUIP) 0.5 MG tablet Take 1 tablet (0.5 mg total) by mouth 2 (two) times daily. IF NEEDED FOR RESTLESS LEGS--NORMALLY JUST AT BEDTIME   . SUMAtriptan (IMITREX) 100 MG tablet Take 1 tablet (100 mg total) by mouth every 2 (two) hours as needed (for migraine headaches.). May repeat in 1 hours if headache persists or recurs.   . tamsulosin (FLOMAX) 0.4 MG CAPS capsule Take by mouth.   . Tiotropium Bromide Monohydrate (SPIRIVA RESPIMAT) 2.5 MCG/ACT AERS Inhale 2.5 mcg into the lungs 2 (two) times daily.   . traMADol (ULTRAM) 50 MG tablet Take 50-100 mg by mouth every 6 (six) hours as needed (FOR PAIN.). 07/22/2016: TYPICALLY TAKES 1 TABLET TWICE DAILY  . valACYclovir (VALTREX) 500 MG tablet Take 500 mg by mouth daily as needed (for fever blisters).   . ALPRAZolam (XANAX) 0.25 MG tablet Take 0.25 mg by mouth 2 (two)  times daily as needed for anxiety.   Marland Kitchen amitriptyline (ELAVIL) 50 MG tablet Take 50 mg by mouth daily.   Marland Kitchen amLODipine (NORVASC) 5 MG tablet Take 1 tablet (5 mg total) by mouth daily.   . Brexpiprazole (REXULTI) 4 MG TABS Take 4 mg by mouth at bedtime.    . DULoxetine (CYMBALTA) 60 MG capsule Take 1 capsule (60 mg total) by mouth daily for 14 days.   Marland Kitchen HYDROcodone-acetaminophen (NORCO) 5-325 MG tablet Take 1 tablet by mouth every 4 (four) hours as needed for moderate pain. (Patient not taking: Reported on 05/05/2018)   . traZODone (DESYREL) 150 MG tablet Take 1 tablet (150 mg total) by mouth at bedtime for 14 days.   . [DISCONTINUED] butalbital-acetaminophen-caffeine (FIORICET, ESGIC) 50-325-40  MG tablet Take 1 tablet by mouth every 4 (four) hours as needed (FOR MIGRAINE HEADACHES).    . [DISCONTINUED] cetirizine (ZYRTEC) 10 MG tablet Take 10 mg by mouth daily as needed for allergies.    . [DISCONTINUED] citalopram (CELEXA) 10 MG tablet Take by mouth.   . [DISCONTINUED] cyproheptadine (PERIACTIN) 4 MG tablet Take 1 tablet (4 mg total) by mouth 3 (three) times daily as needed for allergies. (Patient not taking: Reported on 05/05/2018)   . [DISCONTINUED] diltiazem (DILACOR XR) 180 MG 24 hr capsule Take 1 cap by po daily (Patient not taking: Reported on 05/05/2018)   . [DISCONTINUED] estrogens, conjugated, (PREMARIN) 0.45 MG tablet Take 0.45 mg by mouth at bedtime. Reported on 09/09/2015   . [DISCONTINUED] glucose 4 GM chewable tablet Chew 1-2 tablets by mouth 3 (three) times daily as needed for low blood sugar.   . [DISCONTINUED] hydrOXYzine (ATARAX/VISTARIL) 25 MG tablet Take 25 mg by mouth 3 (three) times daily as needed (FOR ITCHING).   . [DISCONTINUED] predniSONE (STERAPRED UNI-PAK 21 TAB) 10 MG (21) TBPK tablet As directed (Patient not taking: Reported on 05/05/2018)   . [DISCONTINUED] ranitidine (ZANTAC) 150 MG tablet Take 1 tablet (150 mg total) by mouth 2 (two) times daily. (Patient not taking: Reported on 05/05/2018)   . [DISCONTINUED] Vitamin D, Ergocalciferol, (DRISDOL) 50000 units CAPS capsule Take 50,000 Units by mouth every Monday.    No facility-administered encounter medications on file as of 05/05/2018.       Medical History: Past Medical History:  Diagnosis Date  . Acid reflux   . Anxiety   . Arrhythmia    treated with meds and has no current problems  . Arthritis    most uncomfortable in knees  . Asthma    uses several inhalers  . Depression   . Diabetes mellitus without complication (HCC)    sugars run low, not high sugars  . Fever blister   . Hematuria   . Hypertension   . Kidney stone    stones, 2nd lithotripsy  . Left flank pain   . Migraine   . Restless  leg   . Yeast vaginitis      Vital Signs: BP 112/82 (BP Location: Left Arm, Patient Position: Sitting, Cuff Size: Small)   Pulse 93   Temp 98.4 F (36.9 C)   Resp 16   Ht 5\' 3"  (1.6 m)   Wt 172 lb (78 kg)   SpO2 95%   BMI 30.47 kg/m    Review of Systems  Constitutional: Negative for chills, fatigue and unexpected weight change.  HENT: Negative for congestion, rhinorrhea, sneezing and sore throat.   Eyes: Negative for photophobia, pain and redness.  Respiratory: Negative for cough, chest tightness and  shortness of breath.   Cardiovascular: Negative for chest pain and palpitations.  Gastrointestinal: Negative for abdominal pain, constipation, diarrhea, nausea and vomiting.  Endocrine: Negative.   Genitourinary: Negative for dysuria and frequency.  Musculoskeletal: Negative for arthralgias, back pain, joint swelling and neck pain.  Skin: Negative for rash.  Allergic/Immunologic: Negative.   Neurological: Negative for tremors and numbness.  Hematological: Negative for adenopathy. Does not bruise/bleed easily.  Psychiatric/Behavioral: Negative for behavioral problems and sleep disturbance. The patient is not nervous/anxious.     Physical Exam Vitals signs and nursing note reviewed.  Constitutional:      General: She is not in acute distress.    Appearance: She is well-developed. She is not diaphoretic.  HENT:     Head: Normocephalic and atraumatic.     Mouth/Throat:     Pharynx: No oropharyngeal exudate.  Eyes:     Pupils: Pupils are equal, round, and reactive to light.  Neck:     Musculoskeletal: Normal range of motion and neck supple.     Thyroid: No thyromegaly.     Vascular: No JVD.     Trachea: No tracheal deviation.  Cardiovascular:     Rate and Rhythm: Normal rate and regular rhythm.     Heart sounds: Normal heart sounds. No murmur. No friction rub. No gallop.   Pulmonary:     Effort: Pulmonary effort is normal. No respiratory distress.     Breath sounds:  Normal breath sounds. No wheezing or rales.  Chest:     Chest wall: No tenderness.  Abdominal:     Palpations: Abdomen is soft.     Tenderness: There is no abdominal tenderness. There is no guarding.  Musculoskeletal: Normal range of motion.  Lymphadenopathy:     Cervical: No cervical adenopathy.  Skin:    General: Skin is warm and dry.  Neurological:     Mental Status: She is alert and oriented to person, place, and time.     Cranial Nerves: No cranial nerve deficit.  Psychiatric:        Behavior: Behavior normal.        Thought Content: Thought content normal.        Judgment: Judgment normal.    Assessment/Plan: 1. Essential hypertension Stop taking diltiazem and start Norvasc 5 mg at night.  Patient's blood pressure was excellent today during this visit.  We discussed the possibility that her cuff at home could be accurate.  She will keep a log of her blood pressures and call as they changed with this new medicine or if they continue be elevated. - amLODipine (NORVASC) 5 MG tablet; Take 1 tablet (5 mg total) by mouth daily.  Dispense: 30 tablet; Refill: 0  2. Chronic obstructive pulmonary disease, unspecified COPD type ([place]) Stable, patient continues to use inhalers with no complaints at this time.  Continue present management.  3. Moderate persistent asthma with acute exacerbation Stable, continue present regimen.  4. Gastroesophageal reflux disease without esophagitis Stable, continue using Prilosec as directed.  General Counseling: Cassiopeia verbalizes understanding of the findings of todays visit and agrees with plan of treatment. I have discussed any further diagnostic evaluation that may be needed or ordered today. We also reviewed her medications today. she has been encouraged to call the office with any questions or concerns that should arise related to todays visit.   No orders of the defined types were placed in this encounter.   Meds ordered this encounter   Medications  . amLODipine (NORVASC) 5  MG tablet    Sig: Take 1 tablet (5 mg total) by mouth daily.    Dispense:  30 tablet    Refill:  0    Time spent: 25 Minutes  This patient was seen by Orson Gear AGNP-C in Collaboration with Dr Lavera Guise as a part of collaborative care agreement.  Kendell Bane AGNP-C Internal Medicine

## 2018-05-10 ENCOUNTER — Other Ambulatory Visit: Payer: Self-pay | Admitting: Adult Health

## 2018-05-10 ENCOUNTER — Ambulatory Visit (INDEPENDENT_AMBULATORY_CARE_PROVIDER_SITE_OTHER): Payer: Medicare Other | Admitting: Adult Health

## 2018-05-10 ENCOUNTER — Encounter: Payer: Self-pay | Admitting: Adult Health

## 2018-05-10 VITALS — BP 140/84 | HR 111 | Temp 98.7°F | Resp 16 | Ht 63.0 in | Wt 174.0 lb

## 2018-05-10 DIAGNOSIS — R000 Tachycardia, unspecified: Secondary | ICD-10-CM

## 2018-05-10 DIAGNOSIS — R32 Unspecified urinary incontinence: Secondary | ICD-10-CM | POA: Diagnosis not present

## 2018-05-10 DIAGNOSIS — I10 Essential (primary) hypertension: Secondary | ICD-10-CM

## 2018-05-10 DIAGNOSIS — T50905A Adverse effect of unspecified drugs, medicaments and biological substances, initial encounter: Secondary | ICD-10-CM

## 2018-05-10 MED ORDER — METOPROLOL SUCCINATE ER 25 MG PO TB24: 25.0000 mg | ORAL_TABLET | Freq: Every day | ORAL | 1 refills | Status: DC

## 2018-05-10 MED ORDER — HYDROCORTISONE 1 % EX OINT: 1.0000 "application " | TOPICAL_OINTMENT | Freq: Two times a day (BID) | CUTANEOUS | 0 refills | Status: DC

## 2018-05-10 MED ORDER — HYDROXYZINE HCL 10 MG PO TABS: 10.0000 mg | ORAL_TABLET | Freq: Three times a day (TID) | ORAL | 0 refills | Status: DC | PRN

## 2018-05-10 MED ORDER — OXYBUTYNIN CHLORIDE 5 MG PO TABS: 5.0000 mg | ORAL_TABLET | Freq: Two times a day (BID) | ORAL | 3 refills | Status: DC

## 2018-05-10 NOTE — Progress Notes (Signed)
[facility] [place], [place] [address]  Internal MEDICINE  Office Visit Note  Patient Name: Summer Murphy  [id number]  [id number]  Date of Service: 05/10/2018  Chief Complaint  Patient presents with  . Rash    patient started amlodipine and has a breakout on face and arms and stomach, pulse elevated      HPI Pt is here for a sick visit. Pt reports she started taking amlodipine, and noticed a rash on her face, arms, stomach and pulse elevated. She reports the medication did bring her blood pressure down, but the itching started last night.  She has taken multiple doses of benadryl with mild relief.        Current Medication:  Outpatient Encounter Medications as of 05/10/2018  Medication Sig Note  . ALPRAZolam (XANAX) 0.25 MG tablet Take 0.25 mg by mouth 2 (two) times daily as needed for anxiety.   Marland Kitchen amitriptyline (ELAVIL) 50 MG tablet Take 50 mg by mouth daily.   Marland Kitchen azelastine (ASTELIN) 0.1 % nasal spray once as needed.   . Brexpiprazole (REXULTI) 4 MG TABS Take 4 mg by mouth at bedtime.    . budesonide-formoterol (SYMBICORT) 160-4.5 MCG/ACT inhaler Inhale 2 puffs into the lungs 2 (two) times daily as needed (for respiratory issues.).   Marland Kitchen COMBIVENT RESPIMAT 20-100 MCG/ACT AERS respimat INHALE 1 PUFF INTO THE LUNGS EVERY 6(SIX) HOURS.   . cyclobenzaprine (FLEXERIL) 10 MG tablet Take 1 tablet (10 mg total) by mouth 2 (two) times daily as needed for muscle spasms.   . diclofenac sodium (VOLTAREN) 1 % GEL Apply 2 g topically 2 (two) times daily as needed (for pain).    Marland Kitchen diphenhydrAMINE (BENADRYL) 25 MG tablet Take 50 mg by mouth 2 (two) times daily as needed.    . DULoxetine (CYMBALTA) 30 MG capsule Take 30 mg by mouth at bedtime. 07/22/2016: TOTAL DOSE=90 MG  . gabapentin (NEURONTIN) 600 MG tablet Take 1 tablet (600 mg total) by mouth 2 (two) times daily.   Marland Kitchen HYDROcodone-acetaminophen (NORCO) 5-325 MG tablet Take 1 tablet by mouth every 4 (four) hours as  needed for moderate pain.   Marland Kitchen ipratropium-albuterol (DUONEB) 0.5-2.5 (3) MG/3ML SOLN Take 3 mLs by nebulization every 4 (four) hours as needed (for shortness of breath/wheezing).    . LORazepam (ATIVAN) 1 MG tablet Take 1 tablet (1 mg total) by mouth 2 (two) times daily as needed for anxiety. (Patient taking differently: Take 1 mg by mouth every 8 (eight) hours as needed for anxiety. )   . montelukast (SINGULAIR) 10 MG tablet Take 1 tablet (10 mg total) by mouth at bedtime.   Marland Kitchen omeprazole (PRILOSEC) 40 MG capsule Take 1 capsule (40 mg total) by mouth daily.   . ondansetron (ZOFRAN) 8 MG tablet Take 1 tablet (8 mg total) by mouth 3 (three) times daily as needed for nausea or vomiting.   Marland Kitchen oxybutynin (DITROPAN) 5 MG tablet Take 1 tablet (5 mg total) by mouth 2 (two) times daily.   Marland Kitchen rOPINIRole (REQUIP) 0.5 MG tablet Take 1 tablet (0.5 mg total) by mouth 2 (two) times daily. IF NEEDED FOR RESTLESS LEGS--NORMALLY JUST AT BEDTIME   . SUMAtriptan (IMITREX) 100 MG tablet Take 1 tablet (100 mg total) by mouth every 2 (two) hours as needed (for migraine headaches.). May repeat in 1 hours if headache persists or recurs.   . tamsulosin (FLOMAX) 0.4 MG CAPS capsule Take by mouth.   . Tiotropium Bromide Monohydrate (SPIRIVA RESPIMAT) 2.5  MCG/ACT AERS Inhale 2.5 mcg into the lungs 2 (two) times daily.   . traMADol (ULTRAM) 50 MG tablet Take 50-100 mg by mouth every 6 (six) hours as needed (FOR PAIN.). 07/22/2016: TYPICALLY TAKES 1 TABLET TWICE DAILY  . valACYclovir (VALTREX) 500 MG tablet Take 500 mg by mouth daily as needed (for fever blisters).   . [DISCONTINUED] oxybutynin (DITROPAN) 5 MG tablet Take 1 tablet (5 mg total) by mouth 2 (two) times daily.   . DULoxetine (CYMBALTA) 60 MG capsule Take 1 capsule (60 mg total) by mouth daily for 14 days.   . hydrocortisone 1 % ointment Apply 1 application topically 2 (two) times daily.   . hydrOXYzine (ATARAX/VISTARIL) 10 MG tablet Take 1 tablet (10 mg total) by mouth 3  (three) times daily as needed.   . metoprolol succinate (TOPROL-XL) 25 MG 24 hr tablet Take 1 tablet (25 mg total) by mouth daily.   . traZODone (DESYREL) 150 MG tablet Take 1 tablet (150 mg total) by mouth at bedtime for 14 days.   . [DISCONTINUED] amLODipine (NORVASC) 5 MG tablet Take 1 tablet (5 mg total) by mouth daily. (Patient not taking: Reported on 05/10/2018)    No facility-administered encounter medications on file as of 05/10/2018.       Medical History: Past Medical History:  Diagnosis Date  . Acid reflux   . Anxiety   . Arrhythmia    treated with meds and has no current problems  . Arthritis    most uncomfortable in knees  . Asthma    uses several inhalers  . Depression   . Diabetes mellitus without complication (HCC)    sugars run low, not high sugars  . Fever blister   . Hematuria   . Hypertension   . Kidney stone    stones, 2nd lithotripsy  . Left flank pain   . Migraine   . Restless leg   . Yeast vaginitis      Vital Signs: BP 140/84   Pulse (!) 111   Temp 98.7 F (37.1 C)   Resp 16   Ht 5\' 3"  (1.6 m)   Wt 174 lb (78.9 kg)   SpO2 97%   BMI 30.82 kg/m    Review of Systems  Constitutional: Negative for chills, fatigue and unexpected weight change.  HENT: Negative for congestion, rhinorrhea, sneezing and sore throat.   Eyes: Negative for photophobia, pain and redness.  Respiratory: Negative for cough, chest tightness and shortness of breath.   Cardiovascular: Negative for chest pain and palpitations.  Gastrointestinal: Negative for abdominal pain, constipation, diarrhea, nausea and vomiting.  Endocrine: Negative.   Genitourinary: Negative for dysuria and frequency.  Musculoskeletal: Negative for arthralgias, back pain, joint swelling and neck pain.  Skin: Negative for rash.  Allergic/Immunologic: Negative.   Neurological: Negative for tremors and numbness.  Hematological: Negative for adenopathy. Does not bruise/bleed easily.   Psychiatric/Behavioral: Negative for behavioral problems and sleep disturbance. The patient is not nervous/anxious.     Physical Exam Vitals signs and nursing note reviewed.  Constitutional:      General: She is not in acute distress.    Appearance: She is well-developed. She is not diaphoretic.  HENT:     Head: Normocephalic and atraumatic.     Mouth/Throat:     Pharynx: No oropharyngeal exudate.  Eyes:     Pupils: Pupils are equal, round, and reactive to light.  Neck:     Musculoskeletal: Normal range of motion and neck supple.  Thyroid: No thyromegaly.     Vascular: No JVD.     Trachea: No tracheal deviation.  Cardiovascular:     Rate and Rhythm: Normal rate and regular rhythm.     Heart sounds: Normal heart sounds. No murmur. No friction rub. No gallop.   Pulmonary:     Effort: Pulmonary effort is normal. No respiratory distress.     Breath sounds: Normal breath sounds. No wheezing or rales.  Chest:     Chest wall: No tenderness.  Abdominal:     Palpations: Abdomen is soft.     Tenderness: There is no abdominal tenderness. There is no guarding.  Musculoskeletal: Normal range of motion.  Lymphadenopathy:     Cervical: No cervical adenopathy.  Skin:    General: Skin is warm and dry.  Neurological:     Mental Status: She is alert and oriented to person, place, and time.     Cranial Nerves: No cranial nerve deficit.  Psychiatric:        Behavior: Behavior normal.        Thought Content: Thought content normal.        Judgment: Judgment normal.    Assessment/Plan: 1. Medication reaction, initial encounter Patient is likely having medication reaction due to amlodipine that was just started a few days ago.  She reports intense itching and a light raised rash on her abdomen, back and arms.  Prescribe patient some toxicity and to help curb the itching and some hydrocortisone cream to apply to the itching areas. - hydrOXYzine (ATARAX/VISTARIL) 10 MG tablet; Take 1 tablet  (10 mg total) by mouth 3 (three) times daily as needed.  Dispense: 10 tablet; Refill: 0 - hydrocortisone 1 % ointment; Apply 1 application topically 2 (two) times daily.  Dispense: 30 g; Refill: 0  2. Essential hypertension Patient will stop amlodipine and will start taking metoprolol 25 mg daily.  Encourage patient to continue to record blood pressure and pulse was and bring them back with her in 3 weeks for follow-up. - metoprolol succinate (TOPROL-XL) 25 MG 24 hr tablet; Take 1 tablet (25 mg total) by mouth daily.  Dispense: 30 tablet; Refill: 1  3. Tachycardia Patient has had ongoing tachycardia during visits will use a blocker metoprolol to see if this will help improve her tachycardia.  4. Urinary incontinence, unspecified type Patient requested refill on Ditropan provided at this time. - oxybutynin (DITROPAN) 5 MG tablet; Take 1 tablet (5 mg total) by mouth 2 (two) times daily.  Dispense: 60 tablet; Refill: 3  General Counseling: Dechelle verbalizes understanding of the findings of todays visit and agrees with plan of treatment. I have discussed any further diagnostic evaluation that may be needed or ordered today. We also reviewed her medications today. she has been encouraged to call the office with any questions or concerns that should arise related to todays visit.   No orders of the defined types were placed in this encounter.   Meds ordered this encounter  Medications  . metoprolol succinate (TOPROL-XL) 25 MG 24 hr tablet    Sig: Take 1 tablet (25 mg total) by mouth daily.    Dispense:  30 tablet    Refill:  1  . oxybutynin (DITROPAN) 5 MG tablet    Sig: Take 1 tablet (5 mg total) by mouth 2 (two) times daily.    Dispense:  60 tablet    Refill:  3  . hydrOXYzine (ATARAX/VISTARIL) 10 MG tablet    Sig: Take 1 tablet (10  mg total) by mouth 3 (three) times daily as needed.    Dispense:  10 tablet    Refill:  0  . hydrocortisone 1 % ointment    Sig: Apply 1 application  topically 2 (two) times daily.    Dispense:  30 g    Refill:  0    Time spent: 25 Minutes  This patient was seen by Orson Gear AGNP-C in Collaboration with Dr Lavera Guise as a part of collaborative care agreement.  Kendell Bane AGNP-C Internal Medicine

## 2018-05-10 NOTE — Patient Instructions (Signed)
Drug Rash  A drug rash occurs when a medicine causes a change in the color or texture of the skin. It can develop minutes, hours, or days after you take the medicine. The rash may appear on a small area of skin or all over your body. What are the causes? This condition is usually caused by your body's reaction (allergy) to a medicine. It can also be caused by exposure to sunlight after taking a medicine that makes your skin sensitive to light. Though any medicine can cause a rash or reaction, medicines that are more likely to cause rashes include:  Penicillin.  Antibiotic medicines.  Medicines that treat seizures.  Medicines that treat cancer (chemotherapy).  Aspirin and other NSAIDs.  Injectable dyes that contain iodine.  Insulin. What are the signs or symptoms? Symptoms of this condition include:  Redness.  Tiny bumps.  Peeling.  Itching.  Itchy welts (hives).  Swelling. How is this diagnosed? This condition may be diagnosed based on:  A physical exam.  Tests to find out which medicine caused the rash. These tests may include: ? Skin tests. ? Blood tests. ? Challenge test. For this test, you stop taking all the medicines that you do not need to take. Then, you start taking them again by adding back one medicine at a time. How is this treated? This condition is treated with medicines, including:  Antihistamine. This may be given to relieve itching.  NSAIDs. These may be given to reduce swelling and to treat pain.  A steroid medicine. This may be given to reduce swelling. The rash usually goes away when you stop taking the medicine that caused it. Follow these instructions at home:  Take over-the-counter and prescription medicines only as told by your health care provider.  Tell all your health care providers about any medicine reactions that you have had in the past.  If your rash was caused by sensitivity to sunlight, and while your rash is healing: ?  Avoid being in the sun if possible, especially when it is strongest, usually between 10 a.m. and 4 p.m. ? Cover your skin with pants, long sleeves, and a hat when you are exposed to sunlight.  If you have hives: ? Take a cool shower or use a cool compress to relieve itchiness. ? Take over-the-counter antihistamines, as recommended by your health care provider, until the hives are gone. Hives are not contagious.  Keep all follow-up visits as told by your health care provider. This is important. Contact a health care provider if you have:  A fever.  A rash that is not going away.  A rash that gets worse.  A rash that comes back.  Wheezing or coughing. Get help right away if:  You start to have breathing problems.  You start to have shortness of breath.  Your face or throat starts to swell.  You have severe weakness with dizziness or fainting.  You have chest pain. These symptoms may represent a serious problem that is an emergency. Do not wait to see if the symptoms will go away. Get medical help right away. Call your local emergency services (911 in the [place] Do not drive yourself to the hospital. Summary  A drug rash occurs when a medicine causes a change in the color or texture of the skin. The rash may appear on a small area of skin or all over your body.  It can develop minutes, hours, or days after you take the medicine.  Your health care   provider will do various tests to determine what medicine caused your rash.  The rash may be treated with medicine to relieve itching, swelling, and pain. This information is not intended to replace advice given to you by your health care provider. Make sure you discuss any questions you have with your health care provider. Document Released: 04/16/2004 Document Revised: 01/28/2017 Document Reviewed: 01/28/2017 [employer]vier Interactive Patient Education  2019 [employer]  

## 2018-05-25 ENCOUNTER — Ambulatory Visit (INDEPENDENT_AMBULATORY_CARE_PROVIDER_SITE_OTHER): Payer: Medicare Other | Admitting: Adult Health

## 2018-05-25 ENCOUNTER — Encounter: Payer: Self-pay | Admitting: Adult Health

## 2018-05-25 VITALS — BP 162/92 | HR 85 | Temp 98.2°F | Resp 16 | Ht 63.0 in | Wt 175.0 lb

## 2018-05-25 DIAGNOSIS — I10 Essential (primary) hypertension: Secondary | ICD-10-CM

## 2018-05-25 DIAGNOSIS — R51 Headache: Secondary | ICD-10-CM | POA: Diagnosis not present

## 2018-05-25 DIAGNOSIS — R519 Headache, unspecified: Secondary | ICD-10-CM

## 2018-05-25 MED ORDER — TRAMADOL HCL 50 MG PO TABS: 50.0000 mg | ORAL_TABLET | Freq: Four times a day (QID) | ORAL | 0 refills | Status: DC | PRN

## 2018-05-25 NOTE — Patient Instructions (Signed)

## 2018-05-25 NOTE — Progress Notes (Signed)
[facility] [place], [place] [address]  Internal MEDICINE  Office Visit Note  Patient Name: Summer Murphy  [id number]  [id number]  Date of Service: 05/25/2018  Chief Complaint  Patient presents with  . Hypertension    bp has been running high   . Headache     HPI Pt is here for a sick visit.  Pt is here reporting headache today. She has a list of her blood pressures.  They are generally ok, however she does have some instances of systolic greater than 256. Her diastolic is 38-93. She denies any chest pain, sob, or palpitations.  She does report a headache this morning, and she found her BP to be 164/70 at home. Her initial bp in office was 162/92. She has been taking her metoprolol without fail.  She denies changes in her diet.           Current Medication:  Outpatient Encounter Medications as of 05/25/2018  Medication Sig Note  . ALPRAZolam (XANAX) 0.25 MG tablet Take 0.25 mg by mouth 2 (two) times daily as needed for anxiety.   Marland Kitchen amitriptyline (ELAVIL) 50 MG tablet Take 50 mg by mouth daily.   Marland Kitchen azelastine (ASTELIN) 0.1 % nasal spray once as needed.   . Brexpiprazole (REXULTI) 4 MG TABS Take 4 mg by mouth at bedtime.    . budesonide-formoterol (SYMBICORT) 160-4.5 MCG/ACT inhaler Inhale 2 puffs into the lungs 2 (two) times daily as needed (for respiratory issues.).   Marland Kitchen COMBIVENT RESPIMAT 20-100 MCG/ACT AERS respimat INHALE 1 PUFF INTO THE LUNGS EVERY 6(SIX) HOURS.   . cyclobenzaprine (FLEXERIL) 10 MG tablet Take 1 tablet (10 mg total) by mouth 2 (two) times daily as needed for muscle spasms.   . diclofenac sodium (VOLTAREN) 1 % GEL Apply 2 g topically 2 (two) times daily as needed (for pain).    Marland Kitchen diphenhydrAMINE (BENADRYL) 25 MG tablet Take 50 mg by mouth 2 (two) times daily as needed.    . DULoxetine (CYMBALTA) 30 MG capsule Take 30 mg by mouth at bedtime. 07/22/2016: TOTAL DOSE=90 MG  . gabapentin (NEURONTIN) 600 MG tablet Take 1 tablet (600 mg  total) by mouth 2 (two) times daily.   Marland Kitchen HYDROcodone-acetaminophen (NORCO) 5-325 MG tablet Take 1 tablet by mouth every 4 (four) hours as needed for moderate pain.   . hydrocortisone 1 % ointment Apply 1 application topically 2 (two) times daily.   . hydrOXYzine (ATARAX/VISTARIL) 10 MG tablet Take 1 tablet (10 mg total) by mouth 3 (three) times daily as needed.   Marland Kitchen ipratropium-albuterol (DUONEB) 0.5-2.5 (3) MG/3ML SOLN Take 3 mLs by nebulization every 4 (four) hours as needed (for shortness of breath/wheezing).    . LORazepam (ATIVAN) 1 MG tablet Take 1 tablet (1 mg total) by mouth 2 (two) times daily as needed for anxiety. (Patient taking differently: Take 1 mg by mouth every 8 (eight) hours as needed for anxiety. )   . metoprolol succinate (TOPROL-XL) 25 MG 24 hr tablet TAKE 1 TABLET(25 MG) BY MOUTH DAILY   . montelukast (SINGULAIR) 10 MG tablet Take 1 tablet (10 mg total) by mouth at bedtime.   Marland Kitchen omeprazole (PRILOSEC) 40 MG capsule Take 1 capsule (40 mg total) by mouth daily.   . ondansetron (ZOFRAN) 8 MG tablet Take 1 tablet (8 mg total) by mouth 3 (three) times daily as needed for nausea or vomiting.   Marland Kitchen oxybutynin (DITROPAN) 5 MG tablet Take 1 tablet (5 mg total) by  mouth 2 (two) times daily.   Marland Kitchen rOPINIRole (REQUIP) 0.5 MG tablet Take 1 tablet (0.5 mg total) by mouth 2 (two) times daily. IF NEEDED FOR RESTLESS LEGS--NORMALLY JUST AT BEDTIME   . SUMAtriptan (IMITREX) 100 MG tablet Take 1 tablet (100 mg total) by mouth every 2 (two) hours as needed (for migraine headaches.). May repeat in 1 hours if headache persists or recurs.   . tamsulosin (FLOMAX) 0.4 MG CAPS capsule Take by mouth.   . Tiotropium Bromide Monohydrate (SPIRIVA RESPIMAT) 2.5 MCG/ACT AERS Inhale 2.5 mcg into the lungs 2 (two) times daily.   . traMADol (ULTRAM) 50 MG tablet Take 1 tablet (50 mg total) by mouth every 6 (six) hours as needed (FOR PAIN.).   Marland Kitchen valACYclovir (VALTREX) 500 MG tablet Take 500 mg by mouth daily as needed  (for fever blisters).   . [DISCONTINUED] traMADol (ULTRAM) 50 MG tablet Take 50-100 mg by mouth every 6 (six) hours as needed (FOR PAIN.). 07/22/2016: TYPICALLY TAKES 1 TABLET TWICE DAILY  . DULoxetine (CYMBALTA) 60 MG capsule Take 1 capsule (60 mg total) by mouth daily for 14 days.   . traZODone (DESYREL) 150 MG tablet Take 1 tablet (150 mg total) by mouth at bedtime for 14 days.    No facility-administered encounter medications on file as of 05/25/2018.       Medical History: Past Medical History:  Diagnosis Date  . Acid reflux   . Anxiety   . Arrhythmia    treated with meds and has no current problems  . Arthritis    most uncomfortable in knees  . Asthma    uses several inhalers  . Depression   . Diabetes mellitus without complication (HCC)    sugars run low, not high sugars  . Fever blister   . Hematuria   . Hypertension   . Kidney stone    stones, 2nd lithotripsy  . Left flank pain   . Migraine   . Restless leg   . Yeast vaginitis      Vital Signs: BP (!) 162/92 (BP Location: Left Arm, Patient Position: Sitting, Cuff Size: Normal)   Pulse 85   Temp 98.2 F (36.8 C) (Oral)   Resp 16   Ht 5\' 3"  (1.6 m)   Wt 175 lb (79.4 kg)   SpO2 97%   BMI 31.00 kg/m    Review of Systems  Constitutional: Negative for chills, fatigue and unexpected weight change.  HENT: Negative for congestion, rhinorrhea, sneezing and sore throat.   Eyes: Negative for photophobia, pain and redness.  Respiratory: Negative for cough, chest tightness and shortness of breath.   Cardiovascular: Negative for chest pain and palpitations.  Gastrointestinal: Negative for abdominal pain, constipation, diarrhea, nausea and vomiting.  Endocrine: Negative.   Genitourinary: Negative for dysuria and frequency.  Musculoskeletal: Negative for arthralgias, back pain, joint swelling and neck pain.  Skin: Negative for rash.  Allergic/Immunologic: Negative.   Neurological: Negative for tremors and numbness.   Hematological: Negative for adenopathy. Does not bruise/bleed easily.  Psychiatric/Behavioral: Negative for behavioral problems and sleep disturbance. The patient is not nervous/anxious.     Physical Exam Vitals signs and nursing note reviewed.  Constitutional:      General: She is not in acute distress.    Appearance: She is well-developed. She is not diaphoretic.  HENT:     Head: Normocephalic and atraumatic.     Mouth/Throat:     Pharynx: No oropharyngeal exudate.  Eyes:     Pupils: Pupils  are equal, round, and reactive to light.  Neck:     Musculoskeletal: Normal range of motion and neck supple.     Thyroid: No thyromegaly.     Vascular: No JVD.     Trachea: No tracheal deviation.  Cardiovascular:     Rate and Rhythm: Normal rate and regular rhythm.     Heart sounds: Normal heart sounds. No murmur. No friction rub. No gallop.   Pulmonary:     Effort: Pulmonary effort is normal. No respiratory distress.     Breath sounds: Normal breath sounds. No wheezing or rales.  Chest:     Chest wall: No tenderness.  Abdominal:     Palpations: Abdomen is soft.     Tenderness: There is no abdominal tenderness. There is no guarding.  Musculoskeletal: Normal range of motion.  Lymphadenopathy:     Cervical: No cervical adenopathy.  Skin:    General: Skin is warm and dry.  Neurological:     Mental Status: She is alert and oriented to person, place, and time.     Cranial Nerves: No cranial nerve deficit.  Psychiatric:        Behavior: Behavior normal.        Thought Content: Thought content normal.        Judgment: Judgment normal.    Assessment/Plan: 1. Essential hypertension When repeated her BP after our visit was 140/88.  Pt was very anxious that her blood pressure was high.  I have asked that she continue to log her BP.  If she continues to obtain high pressures, she can take 1.5 of her metoprolol tablets.   2. Nonintractable headache, unspecified chronicity pattern,  unspecified headache type Refilled patients Tramadol for headache.  I explained to her that the headache could be from her blood pressure.  However, her bp has normalized.   - traMADol (ULTRAM) 50 MG tablet; Take 1 tablet (50 mg total) by mouth every 6 (six) hours as needed (FOR PAIN.).  Dispense: 10 tablet; Refill: 0  General Counseling: Stefan verbalizes understanding of the findings of todays visit and agrees with plan of treatment. I have discussed any further diagnostic evaluation that may be needed or ordered today. We also reviewed her medications today. she has been encouraged to call the office with any questions or concerns that should arise related to todays visit.   No orders of the defined types were placed in this encounter.   Meds ordered this encounter  Medications  . traMADol (ULTRAM) 50 MG tablet    Sig: Take 1 tablet (50 mg total) by mouth every 6 (six) hours as needed (FOR PAIN.).    Dispense:  10 tablet    Refill:  0    Time spent: 25 Minutes  This patient was seen by Orson Gear AGNP-C in Collaboration with Dr Lavera Guise as a part of collaborative care agreement.  Kendell Bane AGNP-C Internal Medicine

## 2018-05-27 DIAGNOSIS — H18603 Keratoconus, unspecified, bilateral: Secondary | ICD-10-CM | POA: Diagnosis not present

## 2018-05-27 DIAGNOSIS — T85398D Other mechanical complication of other ocular prosthetic devices, implants and grafts, subsequent encounter: Secondary | ICD-10-CM | POA: Diagnosis not present

## 2018-05-27 DIAGNOSIS — H2513 Age-related nuclear cataract, bilateral: Secondary | ICD-10-CM | POA: Diagnosis not present

## 2018-06-01 ENCOUNTER — Other Ambulatory Visit: Payer: Self-pay | Admitting: Adult Health

## 2018-06-01 DIAGNOSIS — I10 Essential (primary) hypertension: Secondary | ICD-10-CM

## 2018-06-02 ENCOUNTER — Ambulatory Visit: Payer: Self-pay | Admitting: Adult Health

## 2018-06-21 ENCOUNTER — Ambulatory Visit: Payer: Self-pay | Admitting: Nurse Practitioner

## 2018-06-22 ENCOUNTER — Ambulatory Visit: Payer: Self-pay | Admitting: Nurse Practitioner

## 2018-06-22 ENCOUNTER — Encounter: Payer: Self-pay | Admitting: Adult Health

## 2018-07-19 ENCOUNTER — Other Ambulatory Visit: Payer: Self-pay | Admitting: Adult Health

## 2018-07-19 DIAGNOSIS — J4541 Moderate persistent asthma with (acute) exacerbation: Secondary | ICD-10-CM

## 2018-07-19 MED ORDER — MONTELUKAST SODIUM 10 MG PO TABS: 10.0000 mg | ORAL_TABLET | Freq: Every day | ORAL | 2 refills | Status: DC

## 2018-07-28 ENCOUNTER — Ambulatory Visit (INDEPENDENT_AMBULATORY_CARE_PROVIDER_SITE_OTHER): Payer: Medicare Other | Admitting: Nurse Practitioner

## 2018-07-28 ENCOUNTER — Encounter: Payer: Self-pay | Admitting: Nurse Practitioner

## 2018-07-28 ENCOUNTER — Other Ambulatory Visit: Payer: Self-pay

## 2018-07-28 VITALS — BP 146/94 | HR 88 | Resp 16 | Ht 63.0 in | Wt 170.0 lb

## 2018-07-28 DIAGNOSIS — R300 Dysuria: Secondary | ICD-10-CM

## 2018-07-28 DIAGNOSIS — J329 Chronic sinusitis, unspecified: Secondary | ICD-10-CM | POA: Diagnosis not present

## 2018-07-28 DIAGNOSIS — R51 Headache: Secondary | ICD-10-CM

## 2018-07-28 DIAGNOSIS — R7303 Prediabetes: Secondary | ICD-10-CM | POA: Diagnosis not present

## 2018-07-28 DIAGNOSIS — J449 Chronic obstructive pulmonary disease, unspecified: Secondary | ICD-10-CM | POA: Diagnosis not present

## 2018-07-28 DIAGNOSIS — Z124 Encounter for screening for malignant neoplasm of cervix: Secondary | ICD-10-CM | POA: Diagnosis not present

## 2018-07-28 DIAGNOSIS — E11649 Type 2 diabetes mellitus with hypoglycemia without coma: Secondary | ICD-10-CM | POA: Diagnosis not present

## 2018-07-28 DIAGNOSIS — Z0001 Encounter for general adult medical examination with abnormal findings: Secondary | ICD-10-CM | POA: Diagnosis not present

## 2018-07-28 DIAGNOSIS — R519 Headache, unspecified: Secondary | ICD-10-CM

## 2018-07-28 LAB — POCT GLYCOSYLATED HEMOGLOBIN (HGB A1C): Hemoglobin A1C: 5.9 % — AB (ref 4.0–5.6)

## 2018-07-28 MED ORDER — AMOXICILLIN 875 MG PO TABS: 875.0000 mg | ORAL_TABLET | Freq: Two times a day (BID) | ORAL | 0 refills | Status: DC

## 2018-07-28 NOTE — Progress Notes (Signed)
[facility] [place], [place] [id number]  Internal MEDICINE  Office Visit Note  Patient Name: Summer Murphy  [id number]  [id number]  Date of Service: 08/15/2018   Pt is here for routine health maintenance examination  Chief Complaint  Patient presents with  . Medicare Wellness  . Gynecologic Exam  . Diabetes  . Depression  . Gastroesophageal Reflux  . Hypertension     The patient is here for health maintenance exam with pap smear today. She has nasal congestion, scratchy throat, headache, itchy, watery eyes, and mild cough. States that symptoms have been going on for a few days. The symptoms are not getting better despite use of OTC medications. She states that she is otherwise doing well. She continues to see psychiatric provider and feels as though her anxiety and depression are generally well controlled. She is due to have routine, fasting labs drawn. She has had mammogram ordered already. It has been pushed back due to possible spread of COVID 19.     Current Medication: Outpatient Encounter Medications as of 07/28/2018  Medication Sig Note  . ALPRAZolam (XANAX) 0.25 MG tablet Take 0.25 mg by mouth 2 (two) times daily as needed for anxiety.   Marland Kitchen amitriptyline (ELAVIL) 50 MG tablet Take 50 mg by mouth daily.   Marland Kitchen amoxicillin (AMOXIL) 875 MG tablet Take 1 tablet (875 mg total) by mouth 2 (two) times daily.   Marland Kitchen azelastine (ASTELIN) 0.1 % nasal spray once as needed.   . Brexpiprazole (REXULTI) 4 MG TABS Take 4 mg by mouth at bedtime.    . budesonide-formoterol (SYMBICORT) 160-4.5 MCG/ACT inhaler Inhale 2 puffs into the lungs 2 (two) times daily as needed (for respiratory issues.).   Marland Kitchen cyclobenzaprine (FLEXERIL) 10 MG tablet Take 1 tablet (10 mg total) by mouth 2 (two) times daily as needed for muscle spasms.   . diclofenac sodium (VOLTAREN) 1 % GEL Apply 2 g topically 2 (two) times daily as needed (for pain).    Marland Kitchen diphenhydrAMINE (BENADRYL) 25 MG tablet  Take 50 mg by mouth 2 (two) times daily as needed.    . DULoxetine (CYMBALTA) 30 MG capsule Take 30 mg by mouth at bedtime. 07/22/2016: TOTAL DOSE=90 MG  . DULoxetine (CYMBALTA) 60 MG capsule Take 1 capsule (60 mg total) by mouth daily for 14 days.   Marland Kitchen HYDROcodone-acetaminophen (NORCO) 5-325 MG tablet Take 1 tablet by mouth every 4 (four) hours as needed for moderate pain.   . hydrocortisone 1 % ointment Apply 1 application topically 2 (two) times daily.   . hydrOXYzine (ATARAX/VISTARIL) 10 MG tablet Take 1 tablet (10 mg total) by mouth 3 (three) times daily as needed.   Marland Kitchen ipratropium-albuterol (DUONEB) 0.5-2.5 (3) MG/3ML SOLN Take 3 mLs by nebulization every 4 (four) hours as needed (for shortness of breath/wheezing).    . LORazepam (ATIVAN) 1 MG tablet Take 1 tablet (1 mg total) by mouth 2 (two) times daily as needed for anxiety. (Patient taking differently: Take 1 mg by mouth every 8 (eight) hours as needed for anxiety. )   . metoprolol succinate (TOPROL-XL) 25 MG 24 hr tablet TAKE 1 TABLET(25 MG) BY MOUTH DAILY   . montelukast (SINGULAIR) 10 MG tablet Take 1 tablet (10 mg total) by mouth at bedtime.   Marland Kitchen omeprazole (PRILOSEC) 40 MG capsule Take 1 capsule (40 mg total) by mouth daily.   . ondansetron (ZOFRAN) 8 MG tablet Take 1 tablet (8 mg total) by mouth 3 (three) times daily as  needed for nausea or vomiting.   Marland Kitchen oxybutynin (DITROPAN) 5 MG tablet Take 1 tablet (5 mg total) by mouth 2 (two) times daily.   Marland Kitchen rOPINIRole (REQUIP) 0.5 MG tablet Take 1 tablet (0.5 mg total) by mouth 2 (two) times daily. IF NEEDED FOR RESTLESS LEGS--NORMALLY JUST AT BEDTIME   . SUMAtriptan (IMITREX) 100 MG tablet Take 1 tablet (100 mg total) by mouth every 2 (two) hours as needed (for migraine headaches.). May repeat in 1 hours if headache persists or recurs.   . tamsulosin (FLOMAX) 0.4 MG CAPS capsule Take by mouth.   . Tiotropium Bromide Monohydrate (SPIRIVA RESPIMAT) 2.5 MCG/ACT AERS Inhale 2.5 mcg into the lungs 2  (two) times daily.   . traMADol (ULTRAM) 50 MG tablet Take 1 tablet (50 mg total) by mouth every 6 (six) hours as needed (FOR PAIN.).   Marland Kitchen traZODone (DESYREL) 150 MG tablet Take 1 tablet (150 mg total) by mouth at bedtime for 14 days.   . valACYclovir (VALTREX) 500 MG tablet Take 500 mg by mouth daily as needed (for fever blisters).   . [DISCONTINUED] COMBIVENT RESPIMAT 20-100 MCG/ACT AERS respimat INHALE 1 PUFF INTO THE LUNGS EVERY 6(SIX) HOURS.   . [DISCONTINUED] gabapentin (NEURONTIN) 600 MG tablet Take 1 tablet (600 mg total) by mouth 2 (two) times daily.    No facility-administered encounter medications on file as of 07/28/2018.     Surgical History: Past Surgical History:  Procedure Laterality Date  . ABDOMINAL HYSTERECTOMY    . AMPUTATION TOE Left 07/31/2016   Procedure: AMPUTATION TOE/MPJ 2nd toe;  Surgeon: Sharlotte Alamo, DPM;  Location: [facility] ORS;  Service: Podiatry;  Laterality: Left;  . APPENDECTOMY  1990  . BACK SURGERY    . BREAST SURGERY     bilateral breast reduction  . CARDIAC ELECTROPHYSIOLOGY STUDY AND ABLATION    . CHOLECYSTECTOMY  1990  . COLONOSCOPY WITH PROPOFOL N/A 01/04/2017   Procedure: COLONOSCOPY WITH PROPOFOL;  Surgeon: Manya Silvas, MD;  Location: [facility] ENDOSCOPY;  Service: Endoscopy;  Laterality: N/A;  . CORNEAL TRANSPLANT    . ESOPHAGOGASTRODUODENOSCOPY (EGD) WITH PROPOFOL N/A 01/04/2017   Procedure: ESOPHAGOGASTRODUODENOSCOPY (EGD) WITH PROPOFOL;  Surgeon: Manya Silvas, MD;  Location: [facility] ENDOSCOPY;  Service: Endoscopy;  Laterality: N/A;  . EXCISION BONE CYST Left 07/31/2016   Procedure: EXCISION BONE CYST/exostectomy 28124/left 2nd;  Surgeon: Sharlotte Alamo, DPM;  Location: [facility] ORS;  Service: Podiatry;  Laterality: Left;  . EXTRACORPOREAL SHOCK WAVE LITHOTRIPSY Left 09/12/2015   Procedure: EXTRACORPOREAL SHOCK WAVE LITHOTRIPSY (ESWL);  Surgeon: Hollice Espy, MD;  Location: [facility] ORS;  Service: Urology;  Laterality: Left;  . FRACTURE SURGERY     left  foot  . HH repair    . JOINT REPLACEMENT Bilateral 2013,2014   total knees  . LAPAROSCOPIC HYSTERECTOMY    . LITHOTRIPSY    . TONSILLECTOMY    . URETEROSCOPY      Medical History: Past Medical History:  Diagnosis Date  . Acid reflux   . Anxiety   . Arrhythmia    treated with meds and has no current problems  . Arthritis    most uncomfortable in knees  . Asthma    uses several inhalers  . Depression   . Diabetes mellitus without complication (HCC)    sugars run low, not high sugars  . Fever blister   . Hematuria   . Hypertension   . Kidney stone    stones, 2nd lithotripsy  . Left flank pain   .  Migraine   . Restless leg   . Yeast vaginitis     Family History: Family History  Problem Relation Age of Onset  . Prostate cancer Father   . Kidney Stones Father   . Kidney disease Neg Hx       Review of Systems  Constitutional: Positive for fatigue. Negative for activity change, chills and unexpected weight change.  HENT: Positive for congestion, postnasal drip, rhinorrhea, sinus pressure and sore throat. Negative for sneezing.   Eyes: Positive for visual disturbance.  Respiratory: Positive for cough and wheezing. Negative for chest tightness and shortness of breath.   Cardiovascular: Negative for chest pain and palpitations.  Gastrointestinal: Positive for nausea. Negative for abdominal pain, constipation, diarrhea and vomiting.  Endocrine: Negative for cold intolerance, heat intolerance, polydipsia and polyuria.  Genitourinary: Negative for dysuria, frequency and urgency.  Musculoskeletal: Positive for arthralgias and myalgias. Negative for back pain, joint swelling and neck pain.  Skin: Negative for rash.  Allergic/Immunologic: Positive for environmental allergies.  Neurological: Positive for headaches. Negative for tremors and numbness.  Hematological: Negative for adenopathy. Does not bruise/bleed easily.  Psychiatric/Behavioral: Positive for dysphoric mood.  Negative for behavioral problems (Depression), sleep disturbance and suicidal ideas. The patient is nervous/anxious.        The patient is routiely seeing psychiatry      Today's Vitals   07/28/18 1406  BP: (!) 146/94  Pulse: 88  Resp: 16  SpO2: 95%  Weight: 170 lb (77.1 kg)  Height: 5\' 3"  (1.6 m)   Body mass index is 30.11 kg/m.   Physical Exam Vitals signs and nursing note reviewed.  Constitutional:      General: She is not in acute distress.    Appearance: Normal appearance. She is well-developed. She is not diaphoretic.  HENT:     Head: Normocephalic and atraumatic.     Right Ear: External ear normal.     Left Ear: External ear normal.     Nose: Congestion present.     Mouth/Throat:     Pharynx: Posterior oropharyngeal erythema present. No oropharyngeal exudate.  Eyes:     Pupils: Pupils are equal, round, and reactive to light.  Neck:     Musculoskeletal: Normal range of motion and neck supple.     Thyroid: No thyromegaly.     Vascular: No carotid bruit or JVD.     Trachea: No tracheal deviation.  Cardiovascular:     Rate and Rhythm: Normal rate and regular rhythm.     Pulses: Normal pulses.     Heart sounds: Normal heart sounds. No murmur. No friction rub. No gallop.   Pulmonary:     Effort: Pulmonary effort is normal. No respiratory distress.     Breath sounds: Normal breath sounds. No wheezing or rales.  Chest:     Chest wall: No tenderness.     Breasts:        Right: Normal. No swelling, bleeding, inverted nipple, mass, nipple discharge, skin change or tenderness.        Left: Normal. No swelling, bleeding, inverted nipple, mass, nipple discharge, skin change or tenderness.  Abdominal:     General: Bowel sounds are normal.     Palpations: Abdomen is soft.     Tenderness: There is no abdominal tenderness.     Hernia: There is no hernia in the right inguinal area or left inguinal area.  Genitourinary:    General: Normal vulva.     Exam position: Supine.  Labia:        Right: No tenderness or lesion.        Left: No tenderness or lesion.      Vagina: Normal. No vaginal discharge, erythema, tenderness or bleeding.     Cervix: Erythema present. No cervical motion tenderness, discharge, friability or cervical bleeding.     Uterus: Normal.      Adnexa: Right adnexa normal and left adnexa normal.     Comments: No tenderness, masses, or organomeglay present during bimanual exam . Musculoskeletal: Normal range of motion.  Lymphadenopathy:     Cervical: No cervical adenopathy.     Lower Body: No right inguinal adenopathy. No left inguinal adenopathy.  Skin:    General: Skin is warm and dry.  Neurological:     Mental Status: She is alert and oriented to person, place, and time. Mental status is at baseline.     Cranial Nerves: No cranial nerve deficit.  Psychiatric:        Behavior: Behavior normal.        Thought Content: Thought content normal.        Judgment: Judgment normal.    Depression screen [facility] 2/9 07/28/2018 05/25/2018 04/22/2018 09/02/2017 08/26/2017  Decreased Interest 0 0 0 0 0  Down, Depressed, Hopeless 0 0 0 0 0  PHQ - 2 Score 0 0 0 0 0  Altered sleeping - - - - -  Tired, decreased energy - - - - -  Change in appetite - - - - -  Feeling bad or failure about yourself  - - - - -  Trouble concentrating - - - - -  Moving slowly or fidgety/restless - - - - -  Suicidal thoughts - - - - -  PHQ-9 Score - - - - -  Difficult doing work/chores - - - - -    Functional Status Survey: Is the patient deaf or have difficulty hearing?: No Does the patient have difficulty seeing, even when wearing glasses/contacts?: Yes Does the patient have difficulty concentrating, remembering, or making decisions?: Yes Does the patient have difficulty walking or climbing stairs?: No Does the patient have difficulty dressing or bathing?: No Does the patient have difficulty doing errands alone such as visiting a doctor's office or shopping?: No  MMSE  - Maria Antonia Exam 07/28/2018  Orientation to time 5  Orientation to Place 5  Registration 3  Attention/ Calculation 5  Recall 3  Language- name 2 objects 2  Language- repeat 1  Language- follow 3 step command 3  Language- read & follow direction 1  Write a sentence 1  Copy design 1  Total score 30    Fall Risk  07/28/2018 05/25/2018 04/22/2018 09/02/2017 08/26/2017  Falls in the past year? 0 0 0 No No      LABS: Recent Results (from the past 2160 hour(s))  Pap IG and HPV (high risk) DNA detection     Status: None   Collection Time: 07/28/18  2:04 PM  Result Value Ref Range   Interpretation UNS     Comment: UNSATISFACTORY FOR EVALUATION.   Category UNS     Comment: Unsatisfactory   Adequacy USLB     Comment: Specimen processed and examined, but unsatisfactory for evaluation of epithelial abnormality because of excessive lubricant.    Clinician Provided ICD10 Comment     Comment: Z12.4   Performed by: Comment     Comment: Diannia Ruder, Supervisory Cytotechnologist (ASCP)   QC reviewed by: Comment  Comment: Orlean Bradford, Supervisory Cytotechnologist (ASCP)   Note: Comment     Comment: The Pap smear is a screening test designed to aid in the detection of premalignant and malignant conditions of the uterine cervix.  It is not a diagnostic procedure and should not be used as the sole means of detecting cervical cancer.  Both false-positive and false-negative reports do occur.    Test Methodology Comment     Comment: This liquid based ThinPrep(R) pap test was screened with the use of an image guided system.    HPV, high-risk Negative Negative    Comment: This nucleic acid amplification high-risk HPV test detects thirteen high-risk types (16,18,31,33,35,39,45,51,52,56,58,59,68) without differentiation.   POCT HgB A1C     Status: Abnormal   Collection Time: 07/28/18  2:29 PM  Result Value Ref Range   Hemoglobin A1C 5.9 (A) 4.0 - 5.6 %   HbA1c POC (<> result, manual  entry)     HbA1c, POC (prediabetic range)     HbA1c, POC (controlled diabetic range)      Assessment/Plan: 1. Encounter for general adult medical examination with abnormal findings Annual health maintenance exam with pap smear today  2. Recurrent sinusitis Start amoxicillin 875mg  twice daily for 10 days. Rest and increase fluids. Continue to take TOC medication as needed to improve symptoms.  - amoxicillin (AMOXIL) 875 MG tablet; Take 1 tablet (875 mg total) by mouth 2 (two) times daily.  Dispense: 20 tablet; Refill: 0  3. Chronic obstructive pulmonary disease, unspecified COPD type ([place]) Stable. Continue inhalers and respiratory medications as prescribed   4. Prediabetes - POCT HgB A1C 5.9 today. Continue to monitor closely.   5. Routine cervical smear - Pap IG and HPV (high risk) DNA detection  6. Nonintractable headache, unspecified chronicity pattern, unspecified headache type Continue to take imitrex as needed and as prescribed   7. Dysuria  General Counseling: Oliver verbalizes understanding of the findings of todays visit and agrees with plan of treatment. I have discussed any further diagnostic evaluation that may be needed or ordered today. We also reviewed her medications today. she has been encouraged to call the office with any questions or concerns that should arise related to todays visit.    Counseling:  Diabetes Counseling:  1. Addition of ACE inh/ ARB'S for nephroprotection. Microalbumin is updated  2. Diabetic foot care, prevention of complications. Podiatry consult 3. Exercise and lose weight.  4. Diabetic eye examination, Diabetic eye exam is updated  5. Monitor blood sugar closlely. nutrition counseling.  6. Sign and symptoms of hypoglycemia including shaking sweating,confusion and headaches.   This patient was seen by Leretha Pol FNP Collaboration with Dr Lavera Guise as a part of collaborative care agreement  Orders Placed This Encounter   Procedures  . POCT HgB A1C    Meds ordered this encounter  Medications  . amoxicillin (AMOXIL) 875 MG tablet    Sig: Take 1 tablet (875 mg total) by mouth 2 (two) times daily.    Dispense:  20 tablet    Refill:  0    Order Specific Question:   Supervising Provider    Answer:   Lavera Guise [[id number]]    Time spent: [place], MD  Internal Medicine

## 2018-07-28 NOTE — Progress Notes (Signed)
Pt blood pressure elevated, informed provider,

## 2018-08-05 LAB — PAP IG AND HPV HIGH-RISK: HPV, high-risk: NEGATIVE

## 2018-08-08 ENCOUNTER — Telehealth: Payer: Self-pay

## 2018-08-08 NOTE — Telephone Encounter (Signed)
-----   Message from Ronnell Freshwater, NP sent at 08/05/2018  4:55 PM EDT ----- Marykay Lex. Can you let her know that pap is negative for HPV. This is good. They could not evaluate for current cellular changes as I had to use a little extra lubricant. This is ok. With HPV negative, we can repeat pap I three years. Thanks.

## 2018-08-08 NOTE — Telephone Encounter (Signed)
Let pt know that pap is negative for HPV and that they could not evaluate for current cellular changes due to using a little extra lubricant,but that is okay and we will do another pap in three years.

## 2018-08-11 ENCOUNTER — Other Ambulatory Visit: Payer: Self-pay | Admitting: Internal Medicine

## 2018-08-11 ENCOUNTER — Other Ambulatory Visit: Payer: Self-pay

## 2018-08-11 DIAGNOSIS — G63 Polyneuropathy in diseases classified elsewhere: Secondary | ICD-10-CM

## 2018-08-11 DIAGNOSIS — J4541 Moderate persistent asthma with (acute) exacerbation: Secondary | ICD-10-CM

## 2018-08-11 MED ORDER — GABAPENTIN 600 MG PO TABS: 600.0000 mg | ORAL_TABLET | Freq: Two times a day (BID) | ORAL | 3 refills | Status: DC

## 2018-08-15 DIAGNOSIS — J329 Chronic sinusitis, unspecified: Secondary | ICD-10-CM | POA: Insufficient documentation

## 2018-08-15 DIAGNOSIS — R519 Headache, unspecified: Secondary | ICD-10-CM | POA: Insufficient documentation

## 2018-08-15 DIAGNOSIS — R7303 Prediabetes: Secondary | ICD-10-CM | POA: Insufficient documentation

## 2018-08-15 DIAGNOSIS — R300 Dysuria: Secondary | ICD-10-CM | POA: Insufficient documentation

## 2018-08-25 ENCOUNTER — Other Ambulatory Visit: Payer: Self-pay

## 2018-08-25 ENCOUNTER — Ambulatory Visit (INDEPENDENT_AMBULATORY_CARE_PROVIDER_SITE_OTHER): Payer: Medicare Other | Admitting: Nurse Practitioner

## 2018-08-25 ENCOUNTER — Ambulatory Visit: Payer: Medicare Other | Admitting: Nurse Practitioner

## 2018-08-25 ENCOUNTER — Encounter: Payer: Self-pay | Admitting: Nurse Practitioner

## 2018-08-25 VITALS — BP 143/81 | HR 91

## 2018-08-25 DIAGNOSIS — J4541 Moderate persistent asthma with (acute) exacerbation: Secondary | ICD-10-CM

## 2018-08-25 DIAGNOSIS — J069 Acute upper respiratory infection, unspecified: Secondary | ICD-10-CM | POA: Diagnosis not present

## 2018-08-25 MED ORDER — CEFUROXIME AXETIL 500 MG PO TABS: 500.0000 mg | ORAL_TABLET | Freq: Two times a day (BID) | ORAL | 0 refills | Status: DC

## 2018-08-25 MED ORDER — PREDNISONE 10 MG (21) PO TBPK: ORAL_TABLET | ORAL | 0 refills | Status: DC

## 2018-08-25 NOTE — Progress Notes (Signed)
[facility] [place], [place] [address]  Internal MEDICINE  Telephone Visit  Patient Name: Summer Murphy  [id number]  [id number]  Date of Service: 08/25/2018  I connected with the patient at 1:01pm by telephone and verified the patients identity using two identifiers.   I discussed the limitations, risks, security and privacy concerns of performing an evaluation and management service by telephone and the availability of in person appointments. I also discussed with the patient that there may be a patient responsible charge related to the service.  The patient expressed understanding and agrees to proceed.    Chief Complaint  Patient presents with  . Telephone Assessment  . Telephone Screen  . Shortness of Breath  . Sinusitis    The patient has been contacted via telephone for follow up visit due to concerns for spread of novel coronavirus.  The patient states that she is wheezing and coughing and having a hard time breathing. Has been having to use her nebulizer two to three times per day. Not helping very much. Denies fever or chills. Has not been exposed, to her knowledge, to [place].       Current Medication: Outpatient Encounter Medications as of 08/25/2018  Medication Sig Note  . ALPRAZolam (XANAX) 0.25 MG tablet Take 0.25 mg by mouth 2 (two) times daily as needed for anxiety.   Marland Kitchen amitriptyline (ELAVIL) 50 MG tablet Take 50 mg by mouth daily.   Marland Kitchen azelastine (ASTELIN) 0.1 % nasal spray once as needed.   . Brexpiprazole (REXULTI) 4 MG TABS Take 4 mg by mouth at bedtime.    . budesonide-formoterol (SYMBICORT) 160-4.5 MCG/ACT inhaler Inhale 2 puffs into the lungs 2 (two) times daily as needed (for respiratory issues.).   Marland Kitchen COMBIVENT RESPIMAT 20-100 MCG/ACT AERS respimat INHALE 1 PUFF INTO THE LUNGS EVERY 6 HOURS   . cyclobenzaprine (FLEXERIL) 10 MG tablet Take 1 tablet (10 mg total) by mouth 2 (two) times daily as needed for muscle spasms.   .  diclofenac sodium (VOLTAREN) 1 % GEL Apply 2 g topically 2 (two) times daily as needed (for pain).    Marland Kitchen diphenhydrAMINE (BENADRYL) 25 MG tablet Take 50 mg by mouth 2 (two) times daily as needed.    . DULoxetine (CYMBALTA) 30 MG capsule Take 30 mg by mouth at bedtime. 07/22/2016: TOTAL DOSE=90 MG  . gabapentin (NEURONTIN) 600 MG tablet Take 1 tablet (600 mg total) by mouth 2 (two) times daily.   Marland Kitchen HYDROcodone-acetaminophen (NORCO) 5-325 MG tablet Take 1 tablet by mouth every 4 (four) hours as needed for moderate pain.   . hydrocortisone 1 % ointment Apply 1 application topically 2 (two) times daily.   . hydrOXYzine (ATARAX/VISTARIL) 10 MG tablet Take 1 tablet (10 mg total) by mouth 3 (three) times daily as needed.   Marland Kitchen ipratropium-albuterol (DUONEB) 0.5-2.5 (3) MG/3ML SOLN Take 3 mLs by nebulization every 4 (four) hours as needed (for shortness of breath/wheezing).    . LORazepam (ATIVAN) 1 MG tablet Take 1 tablet (1 mg total) by mouth 2 (two) times daily as needed for anxiety. (Patient taking differently: Take 1 mg by mouth every 8 (eight) hours as needed for anxiety. )   . metoprolol succinate (TOPROL-XL) 25 MG 24 hr tablet TAKE 1 TABLET(25 MG) BY MOUTH DAILY   . montelukast (SINGULAIR) 10 MG tablet Take 1 tablet (10 mg total) by mouth at bedtime.   Marland Kitchen omeprazole (PRILOSEC) 40 MG capsule Take 1 capsule (40 mg total) by  mouth daily.   . ondansetron (ZOFRAN) 8 MG tablet Take 1 tablet (8 mg total) by mouth 3 (three) times daily as needed for nausea or vomiting.   Marland Kitchen oxybutynin (DITROPAN) 5 MG tablet Take 1 tablet (5 mg total) by mouth 2 (two) times daily.   Marland Kitchen rOPINIRole (REQUIP) 0.5 MG tablet Take 1 tablet (0.5 mg total) by mouth 2 (two) times daily. IF NEEDED FOR RESTLESS LEGS--NORMALLY JUST AT BEDTIME   . SUMAtriptan (IMITREX) 100 MG tablet Take 1 tablet (100 mg total) by mouth every 2 (two) hours as needed (for migraine headaches.). May repeat in 1 hours if headache persists or recurs.   . tamsulosin  (FLOMAX) 0.4 MG CAPS capsule Take by mouth.   . Tiotropium Bromide Monohydrate (SPIRIVA RESPIMAT) 2.5 MCG/ACT AERS Inhale 2.5 mcg into the lungs 2 (two) times daily.   . traMADol (ULTRAM) 50 MG tablet Take 1 tablet (50 mg total) by mouth every 6 (six) hours as needed (FOR PAIN.).   Marland Kitchen valACYclovir (VALTREX) 500 MG tablet Take 500 mg by mouth daily as needed (for fever blisters).   Marland Kitchen amoxicillin (AMOXIL) 875 MG tablet Take 1 tablet (875 mg total) by mouth 2 (two) times daily. (Patient not taking: Reported on 08/25/2018)   . cefUROXime (CEFTIN) 500 MG tablet Take 1 tablet (500 mg total) by mouth 2 (two) times daily with a meal.   . DULoxetine (CYMBALTA) 60 MG capsule Take 1 capsule (60 mg total) by mouth daily for 14 days.   . predniSONE (STERAPRED UNI-PAK 21 TAB) 10 MG (21) TBPK tablet 6 day taper - take by mouth as directed for 6 days   . traZODone (DESYREL) 150 MG tablet Take 1 tablet (150 mg total) by mouth at bedtime for 14 days.    No facility-administered encounter medications on file as of 08/25/2018.     Surgical History: Past Surgical History:  Procedure Laterality Date  . ABDOMINAL HYSTERECTOMY    . AMPUTATION TOE Left 07/31/2016   Procedure: AMPUTATION TOE/MPJ 2nd toe;  Surgeon: Sharlotte Alamo, DPM;  Location: [facility] ORS;  Service: Podiatry;  Laterality: Left;  . APPENDECTOMY  1990  . BACK SURGERY    . BREAST SURGERY     bilateral breast reduction  . CARDIAC ELECTROPHYSIOLOGY STUDY AND ABLATION    . CHOLECYSTECTOMY  1990  . COLONOSCOPY WITH PROPOFOL N/A 01/04/2017   Procedure: COLONOSCOPY WITH PROPOFOL;  Surgeon: Manya Silvas, MD;  Location: [facility] ENDOSCOPY;  Service: Endoscopy;  Laterality: N/A;  . CORNEAL TRANSPLANT    . ESOPHAGOGASTRODUODENOSCOPY (EGD) WITH PROPOFOL N/A 01/04/2017   Procedure: ESOPHAGOGASTRODUODENOSCOPY (EGD) WITH PROPOFOL;  Surgeon: Manya Silvas, MD;  Location: [facility] ENDOSCOPY;  Service: Endoscopy;  Laterality: N/A;  . EXCISION BONE CYST Left 07/31/2016    Procedure: EXCISION BONE CYST/exostectomy 28124/left 2nd;  Surgeon: Sharlotte Alamo, DPM;  Location: [facility] ORS;  Service: Podiatry;  Laterality: Left;  . EXTRACORPOREAL SHOCK WAVE LITHOTRIPSY Left 09/12/2015   Procedure: EXTRACORPOREAL SHOCK WAVE LITHOTRIPSY (ESWL);  Surgeon: Hollice Espy, MD;  Location: [facility] ORS;  Service: Urology;  Laterality: Left;  . FRACTURE SURGERY     left foot  . HH repair    . JOINT REPLACEMENT Bilateral 2013,2014   total knees  . LAPAROSCOPIC HYSTERECTOMY    . LITHOTRIPSY    . TONSILLECTOMY    . URETEROSCOPY      Medical History: Past Medical History:  Diagnosis Date  . Acid reflux   . Anxiety   . Arrhythmia    treated  with meds and has no current problems  . Arthritis    most uncomfortable in knees  . Asthma    uses several inhalers  . Depression   . Diabetes mellitus without complication (HCC)    sugars run low, not high sugars  . Fever blister   . Hematuria   . Hypertension   . Kidney stone    stones, 2nd lithotripsy  . Left flank pain   . Migraine   . Restless leg   . Yeast vaginitis     Family History: Family History  Problem Relation Age of Onset  . Prostate cancer Father   . Kidney Stones Father   . Kidney disease Neg Hx     Social History   Socioeconomic History  . Marital status: Divorced    Spouse name: Not on file  . Number of children: Not on file  . Years of education: Not on file  . Highest education level: Not on file  Occupational History  . Not on file  Social Needs  . Financial resource strain: Not on file  . Food insecurity:    Worry: Not on file    Inability: Not on file  . Transportation needs:    Medical: Not on file    Non-medical: Not on file  Tobacco Use  . Smoking status: Never Smoker  . Smokeless tobacco: Never Used  Substance and Sexual Activity  . Alcohol use: No  . Drug use: No  . Sexual activity: Not on file  Lifestyle  . Physical activity:    Days per week: Not on file    Minutes per  session: Not on file  . Stress: Not on file  Relationships  . Social connections:    Talks on phone: Not on file    Gets together: Not on file    Attends religious service: Not on file    Active member of club or organization: Not on file    Attends meetings of clubs or organizations: Not on file    Relationship status: Not on file  . Intimate partner violence:    Fear of current or ex partner: Not on file    Emotionally abused: Not on file    Physically abused: Not on file    Forced sexual activity: Not on file  Other Topics Concern  . Not on file  Social History Narrative  . Not on file      Review of Systems  Constitutional: Positive for fatigue. Negative for chills and fever.  HENT: Positive for congestion, postnasal drip, rhinorrhea and sore throat.   Respiratory: Positive for cough, shortness of breath, wheezing and stridor.   Cardiovascular: Negative for chest pain and palpitations.  Gastrointestinal: Negative for nausea.  Musculoskeletal: Positive for arthralgias.  Allergic/Immunologic: Positive for environmental allergies.  Neurological: Positive for headaches.  Psychiatric/Behavioral: Negative.     Today's Vitals   08/25/18 1157  BP: (!) 143/81  Pulse: 91   There is no height or weight on file to calculate BMI.  Observation/Objective:  Patient has audible upper airway wheezing. She sounds worried and is in mild distress. She has to pause for breath every few words.    Assessment/Plan: 1. Acute upper respiratory infection Start ceftin 500mg  bid for 10 days. Rest and increase fluids.  - cefUROXime (CEFTIN) 500 MG tablet; Take 1 tablet (500 mg total) by mouth 2 (two) times daily with a meal.  Dispense: 20 tablet; Refill: 0  2. Moderate persistent asthma with acute exacerbation  Add prednisone 10mg  taper. Take as directed for 6 days. Continue to use nebulizer treatments as needed and as prescribed. Advised her to seek emergency care if breathing worsened. She  agreed.  - predniSONE (STERAPRED UNI-PAK 21 TAB) 10 MG (21) TBPK tablet; 6 day taper - take by mouth as directed for 6 days  Dispense: 21 tablet; Refill: 0  General Counseling: Malissa verbalizes understanding of the findings of today's phone visit and agrees with plan of treatment. I have discussed any further diagnostic evaluation that may be needed or ordered today. We also reviewed her medications today. she has been encouraged to call the office with any questions or concerns that should arise related to todays visit.  Rest and increase fluids. Continue using OTC medication to control symptoms.   This patient was seen by [place] with Dr Lavera Guise as a part of collaborative care agreement  Meds ordered this encounter  Medications  . predniSONE (STERAPRED UNI-PAK 21 TAB) 10 MG (21) TBPK tablet    Sig: 6 day taper - take by mouth as directed for 6 days    Dispense:  21 tablet    Refill:  0    Order Specific Question:   Supervising Provider    Answer:   Lavera Guise [place]  . cefUROXime (CEFTIN) 500 MG tablet    Sig: Take 1 tablet (500 mg total) by mouth 2 (two) times daily with a meal.    Dispense:  20 tablet    Refill:  0    Order Specific Question:   Supervising Provider    Answer:   Lavera Guise [[id number]]    Time spent: 63 Minutes    Dr Lavera Guise Internal medicine

## 2018-09-08 ENCOUNTER — Other Ambulatory Visit: Payer: Self-pay | Admitting: Adult Health

## 2018-09-08 ENCOUNTER — Other Ambulatory Visit: Payer: Self-pay

## 2018-09-08 ENCOUNTER — Ambulatory Visit (INDEPENDENT_AMBULATORY_CARE_PROVIDER_SITE_OTHER): Payer: Medicare Other | Admitting: Internal Medicine

## 2018-09-08 ENCOUNTER — Encounter: Payer: Self-pay | Admitting: Internal Medicine

## 2018-09-08 ENCOUNTER — Other Ambulatory Visit: Payer: Self-pay | Admitting: Nurse Practitioner

## 2018-09-08 VITALS — Temp 97.8°F | Ht 63.0 in

## 2018-09-08 DIAGNOSIS — R0602 Shortness of breath: Secondary | ICD-10-CM

## 2018-09-08 DIAGNOSIS — J4541 Moderate persistent asthma with (acute) exacerbation: Secondary | ICD-10-CM | POA: Diagnosis not present

## 2018-09-08 DIAGNOSIS — R32 Unspecified urinary incontinence: Secondary | ICD-10-CM

## 2018-09-08 MED ORDER — PREDNISONE 10 MG (21) PO TBPK: ORAL_TABLET | ORAL | 0 refills | Status: DC

## 2018-09-08 MED ORDER — AZITHROMYCIN 250 MG PO TABS: ORAL_TABLET | ORAL | 0 refills | Status: DC

## 2018-09-08 MED ORDER — ROPINIROLE HCL 0.5 MG PO TABS: 0.5000 mg | ORAL_TABLET | Freq: Two times a day (BID) | ORAL | 3 refills | Status: DC

## 2018-09-08 NOTE — Progress Notes (Signed)
[facility] [place], [place] [address]  Internal MEDICINE  Telephone Visit  Patient Name: Summer Murphy  [id number]  [id number]  Date of Service: 09/15/2018  I connected with the patient at 1245 by telephone and verified the patients identity using two identifiers.   I discussed the limitations, risks, security and privacy concerns of performing an evaluation and management service by telephone and the availability of in person appointments. I also discussed with the patient that there may be a patient responsible charge related to the service.  The patient expressed understanding and agrees to proceed.    Chief Complaint  Patient presents with  . Telephone Assessment  . Telephone Screen  . Asthma  . Shortness of Breath  . URI    had uri before at last visit on 08/25/18, given prednison and ceftin and pt does not have chest congestion   . Cough    pt was having a slight,dry cough    HPI Pt is c/o cough and chest tightness, she is improved after getting treatment 2 weeks ago but not 100%, denies fever or chills, has sob and it is making her tired, She thinks she is having uncontrolled asthma, denies any exposure to covid -19  Current Medication: Outpatient Encounter Medications as of 09/08/2018  Medication Sig Note  . ALPRAZolam (XANAX) 0.25 MG tablet Take 0.25 mg by mouth 2 (two) times daily as needed for anxiety.   Marland Kitchen amitriptyline (ELAVIL) 50 MG tablet Take 50 mg by mouth daily.   Marland Kitchen azelastine (ASTELIN) 0.1 % nasal spray once as needed.   Marland Kitchen azithromycin (ZITHROMAX) 250 MG tablet Take one tab a day for 10 days for uri   . Brexpiprazole (REXULTI) 4 MG TABS Take 4 mg by mouth at bedtime.    . budesonide-formoterol (SYMBICORT) 160-4.5 MCG/ACT inhaler Inhale 2 puffs into the lungs 2 (two) times daily as needed (for respiratory issues.).   Marland Kitchen cefUROXime (CEFTIN) 500 MG tablet Take 1 tablet (500 mg total) by mouth 2 (two) times daily with a meal.   . COMBIVENT  RESPIMAT 20-100 MCG/ACT AERS respimat INHALE 1 PUFF INTO THE LUNGS EVERY 6 HOURS   . cyclobenzaprine (FLEXERIL) 10 MG tablet Take 1 tablet (10 mg total) by mouth 2 (two) times daily as needed for muscle spasms.   . diclofenac sodium (VOLTAREN) 1 % GEL Apply 2 g topically 2 (two) times daily as needed (for pain).    Marland Kitchen diphenhydrAMINE (BENADRYL) 25 MG tablet Take 50 mg by mouth 2 (two) times daily as needed.    . DULoxetine (CYMBALTA) 30 MG capsule Take 30 mg by mouth at bedtime. 07/22/2016: TOTAL DOSE=90 MG  . DULoxetine (CYMBALTA) 60 MG capsule Take 1 capsule (60 mg total) by mouth daily for 14 days.   Marland Kitchen gabapentin (NEURONTIN) 600 MG tablet Take 1 tablet (600 mg total) by mouth 2 (two) times daily.   Marland Kitchen HYDROcodone-acetaminophen (NORCO) 5-325 MG tablet Take 1 tablet by mouth every 4 (four) hours as needed for moderate pain.   . hydrocortisone 1 % ointment Apply 1 application topically 2 (two) times daily.   . hydrOXYzine (ATARAX/VISTARIL) 10 MG tablet Take 1 tablet (10 mg total) by mouth 3 (three) times daily as needed.   Marland Kitchen ipratropium-albuterol (DUONEB) 0.5-2.5 (3) MG/3ML SOLN Take 3 mLs by nebulization every 4 (four) hours as needed (for shortness of breath/wheezing).    . metoprolol succinate (TOPROL-XL) 25 MG 24 hr tablet TAKE 1 TABLET(25 MG) BY MOUTH DAILY   .  montelukast (SINGULAIR) 10 MG tablet Take 1 tablet (10 mg total) by mouth at bedtime.   Marland Kitchen omeprazole (PRILOSEC) 40 MG capsule Take 1 capsule (40 mg total) by mouth daily.   . ondansetron (ZOFRAN) 8 MG tablet Take 1 tablet (8 mg total) by mouth 3 (three) times daily as needed for nausea or vomiting.   . predniSONE (STERAPRED UNI-PAK 21 TAB) 10 MG (21) TBPK tablet 6 day taper - take by mouth as directed for 6 days   . SUMAtriptan (IMITREX) 100 MG tablet Take 1 tablet (100 mg total) by mouth every 2 (two) hours as needed (for migraine headaches.). May repeat in 1 hours if headache persists or recurs.   . tamsulosin (FLOMAX) 0.4 MG CAPS capsule  Take by mouth.   . Tiotropium Bromide Monohydrate (SPIRIVA RESPIMAT) 2.5 MCG/ACT AERS Inhale 2.5 mcg into the lungs 2 (two) times daily.   . traMADol (ULTRAM) 50 MG tablet Take 1 tablet (50 mg total) by mouth every 6 (six) hours as needed (FOR PAIN.).   Marland Kitchen traZODone (DESYREL) 150 MG tablet Take 1 tablet (150 mg total) by mouth at bedtime for 14 days.   . valACYclovir (VALTREX) 500 MG tablet Take 500 mg by mouth daily as needed (for fever blisters).   . [DISCONTINUED] amoxicillin (AMOXIL) 875 MG tablet Take 1 tablet (875 mg total) by mouth 2 (two) times daily. (Patient not taking: Reported on 08/25/2018)   . [DISCONTINUED] LORazepam (ATIVAN) 1 MG tablet Take 1 tablet (1 mg total) by mouth 2 (two) times daily as needed for anxiety. (Patient taking differently: Take 1 mg by mouth every 8 (eight) hours as needed for anxiety. )   . [DISCONTINUED] oxybutynin (DITROPAN) 5 MG tablet Take 1 tablet (5 mg total) by mouth 2 (two) times daily.   . [DISCONTINUED] predniSONE (STERAPRED UNI-PAK 21 TAB) 10 MG (21) TBPK tablet 6 day taper - take by mouth as directed for 6 days   . [DISCONTINUED] rOPINIRole (REQUIP) 0.5 MG tablet Take 1 tablet (0.5 mg total) by mouth 2 (two) times daily. IF NEEDED FOR RESTLESS LEGS--NORMALLY JUST AT BEDTIME    No facility-administered encounter medications on file as of 09/08/2018.     Surgical History: Past Surgical History:  Procedure Laterality Date  . ABDOMINAL HYSTERECTOMY    . AMPUTATION TOE Left 07/31/2016   Procedure: AMPUTATION TOE/MPJ 2nd toe;  Surgeon: Sharlotte Alamo, DPM;  Location: [facility] ORS;  Service: Podiatry;  Laterality: Left;  . APPENDECTOMY  1990  . BACK SURGERY    . BREAST SURGERY     bilateral breast reduction  . CARDIAC ELECTROPHYSIOLOGY STUDY AND ABLATION    . CHOLECYSTECTOMY  1990  . COLONOSCOPY WITH PROPOFOL N/A 01/04/2017   Procedure: COLONOSCOPY WITH PROPOFOL;  Surgeon: Manya Silvas, MD;  Location: [facility] ENDOSCOPY;  Service: Endoscopy;  Laterality: N/A;   . CORNEAL TRANSPLANT    . ESOPHAGOGASTRODUODENOSCOPY (EGD) WITH PROPOFOL N/A 01/04/2017   Procedure: ESOPHAGOGASTRODUODENOSCOPY (EGD) WITH PROPOFOL;  Surgeon: Manya Silvas, MD;  Location: [facility] ENDOSCOPY;  Service: Endoscopy;  Laterality: N/A;  . EXCISION BONE CYST Left 07/31/2016   Procedure: EXCISION BONE CYST/exostectomy 28124/left 2nd;  Surgeon: Sharlotte Alamo, DPM;  Location: [facility] ORS;  Service: Podiatry;  Laterality: Left;  . EXTRACORPOREAL SHOCK WAVE LITHOTRIPSY Left 09/12/2015   Procedure: EXTRACORPOREAL SHOCK WAVE LITHOTRIPSY (ESWL);  Surgeon: Hollice Espy, MD;  Location: [facility] ORS;  Service: Urology;  Laterality: Left;  . FRACTURE SURGERY     left foot  . HH repair    .  JOINT REPLACEMENT Bilateral 2013,2014   total knees  . LAPAROSCOPIC HYSTERECTOMY    . LITHOTRIPSY    . TONSILLECTOMY    . URETEROSCOPY      Medical History: Past Medical History:  Diagnosis Date  . Acid reflux   . Anxiety   . Arrhythmia    treated with meds and has no current problems  . Arthritis    most uncomfortable in knees  . Asthma    uses several inhalers  . Depression   . Diabetes mellitus without complication (HCC)    sugars run low, not high sugars  . Fever blister   . Hematuria   . Hypertension   . Kidney stone    stones, 2nd lithotripsy  . Left flank pain   . Migraine   . Restless leg   . Yeast vaginitis     Family History: Family History  Problem Relation Age of Onset  . Prostate cancer Father   . Kidney Stones Father   . Kidney disease Neg Hx     Social History   Socioeconomic History  . Marital status: Divorced    Spouse name: Not on file  . Number of children: Not on file  . Years of education: Not on file  . Highest education level: Not on file  Occupational History  . Not on file  Social Needs  . Financial resource strain: Not on file  . Food insecurity    Worry: Not on file    Inability: Not on file  . Transportation needs    Medical: Not on file     Non-medical: Not on file  Tobacco Use  . Smoking status: Never Smoker  . Smokeless tobacco: Never Used  Substance and Sexual Activity  . Alcohol use: No  . Drug use: No  . Sexual activity: Not on file  Lifestyle  . Physical activity    Days per week: Not on file    Minutes per session: Not on file  . Stress: Not on file  Relationships  . Social Herbalist on phone: Not on file    Gets together: Not on file    Attends religious service: Not on file    Active member of club or organization: Not on file    Attends meetings of clubs or organizations: Not on file    Relationship status: Not on file  . Intimate partner violence    Fear of current or ex partner: Not on file    Emotionally abused: Not on file    Physically abused: Not on file    Forced sexual activity: Not on file  Other Topics Concern  . Not on file  Social History Narrative  . Not on file   Review of Systems  Constitutional: Negative.  Negative for fatigue and fever.  HENT: Positive for postnasal drip. Negative for ear discharge and sinus pain.   Eyes: Negative.   Respiratory: Positive for cough and shortness of breath.   Gastrointestinal: Negative.   Genitourinary: Negative.    Vital Signs: Temp 97.8 F (36.6 C)   Ht 5\' 3"  (1.6 m)   BMI 30.11 kg/m   Observation/Objective: Pt is pleasant to talk, NAD, however chest feels congested when talks.   Assessment/Plan: 1. Moderate persistent asthma with acute exacerbation Pt is to take one for dose pack for 6 days , monitor blood sugar at home  - predniSONE (STERAPRED UNI-PAK 21 TAB) 10 MG (21) TBPK tablet; 6 day taper - take  by mouth as directed for 6 days  Dispense: 21 tablet; Refill: 0 - azithromycin (ZITHROMAX) 250 MG tablet; Take one tab a day for 10 days for uri  Dispense: 10 tablet; Refill: 0  2. SOB (shortness of breath) Pt was instructed to sue her Symbicort inhaler 2 x day and Duoneb prn and not to use Combivent with Duoneb   General  Counseling: Carrianne verbalizes understanding of the findings of today's phone visit and agrees with plan of treatment. I have discussed any further diagnostic evaluation that may be needed or ordered today. We also reviewed her medications today. she has been encouraged to call the office with any questions or concerns that should arise related to todays visit.   Meds ordered this encounter  Medications  . predniSONE (STERAPRED UNI-PAK 21 TAB) 10 MG (21) TBPK tablet    Sig: 6 day taper - take by mouth as directed for 6 days    Dispense:  21 tablet    Refill:  0  . azithromycin (ZITHROMAX) 250 MG tablet    Sig: Take one tab a day for 10 days for uri    Dispense:  10 tablet    Refill:  0    Time spent:18 Minutes  Dr Lavera Guise Internal medicine

## 2018-09-13 ENCOUNTER — Other Ambulatory Visit: Payer: Self-pay

## 2018-09-13 ENCOUNTER — Ambulatory Visit (INDEPENDENT_AMBULATORY_CARE_PROVIDER_SITE_OTHER): Payer: Medicare Other | Admitting: Internal Medicine

## 2018-09-13 ENCOUNTER — Encounter: Payer: Self-pay | Admitting: Internal Medicine

## 2018-09-13 VITALS — BP 103/55 | HR 69 | Ht 63.0 in | Wt 170.0 lb

## 2018-09-13 DIAGNOSIS — J069 Acute upper respiratory infection, unspecified: Secondary | ICD-10-CM

## 2018-09-13 DIAGNOSIS — J449 Chronic obstructive pulmonary disease, unspecified: Secondary | ICD-10-CM

## 2018-09-13 DIAGNOSIS — I10 Essential (primary) hypertension: Secondary | ICD-10-CM | POA: Diagnosis not present

## 2018-09-13 NOTE — Progress Notes (Signed)
[facility] [place], [place] [address]  Internal MEDICINE  Telephone Visit  Patient Name: Summer Murphy  [id number]  [id number]  Date of Service: 09/13/2018  I connected with the patient at [phone] by telephone and verified the patients identity using two identifiers.   I discussed the limitations, risks, security and privacy concerns of performing an evaluation and management service by telephone and the availability of in person appointments. I also discussed with the patient that there may be a patient responsible charge related to the service.  The patient expressed understanding and agrees to proceed.    Chief Complaint  Patient presents with  . Asthma    HPI  PT seen via telephone for follow up on asthma. She has recently been treated for respiratory infection.  She has had a round of azithromycin, and Ceftin as well as prednisone.  She is using her inhalers as well as nebulizer but continues to have some sob at times.  She contributes this to the humidity recently.     Current Medication: Outpatient Encounter Medications as of 09/13/2018  Medication Sig Note  . ALPRAZolam (XANAX) 0.25 MG tablet Take 0.25 mg by mouth 2 (two) times daily as needed for anxiety.   Marland Kitchen amitriptyline (ELAVIL) 50 MG tablet Take 50 mg by mouth daily.   Marland Kitchen azelastine (ASTELIN) 0.1 % nasal spray once as needed.   Marland Kitchen azithromycin (ZITHROMAX) 250 MG tablet Take one tab a day for 10 days for uri   . Brexpiprazole (REXULTI) 4 MG TABS Take 4 mg by mouth at bedtime.    . budesonide-formoterol (SYMBICORT) 160-4.5 MCG/ACT inhaler Inhale 2 puffs into the lungs 2 (two) times daily as needed (for respiratory issues.).   Marland Kitchen cefUROXime (CEFTIN) 500 MG tablet Take 1 tablet (500 mg total) by mouth 2 (two) times daily with a meal.   . COMBIVENT RESPIMAT 20-100 MCG/ACT AERS respimat INHALE 1 PUFF INTO THE LUNGS EVERY 6 HOURS   . cyclobenzaprine (FLEXERIL) 10 MG tablet Take 1 tablet (10 mg total) by  mouth 2 (two) times daily as needed for muscle spasms.   . diclofenac sodium (VOLTAREN) 1 % GEL Apply 2 g topically 2 (two) times daily as needed (for pain).    Marland Kitchen diphenhydrAMINE (BENADRYL) 25 MG tablet Take 50 mg by mouth 2 (two) times daily as needed.    . DULoxetine (CYMBALTA) 30 MG capsule Take 30 mg by mouth at bedtime. 07/22/2016: TOTAL DOSE=90 MG  . gabapentin (NEURONTIN) 600 MG tablet Take 1 tablet (600 mg total) by mouth 2 (two) times daily.   Marland Kitchen HYDROcodone-acetaminophen (NORCO) 5-325 MG tablet Take 1 tablet by mouth every 4 (four) hours as needed for moderate pain.   . hydrocortisone 1 % ointment Apply 1 application topically 2 (two) times daily.   . hydrOXYzine (ATARAX/VISTARIL) 10 MG tablet Take 1 tablet (10 mg total) by mouth 3 (three) times daily as needed.   Marland Kitchen ipratropium-albuterol (DUONEB) 0.5-2.5 (3) MG/3ML SOLN Take 3 mLs by nebulization every 4 (four) hours as needed (for shortness of breath/wheezing).    . metoprolol succinate (TOPROL-XL) 25 MG 24 hr tablet TAKE 1 TABLET(25 MG) BY MOUTH DAILY   . montelukast (SINGULAIR) 10 MG tablet Take 1 tablet (10 mg total) by mouth at bedtime.   Marland Kitchen omeprazole (PRILOSEC) 40 MG capsule Take 1 capsule (40 mg total) by mouth daily.   . ondansetron (ZOFRAN) 8 MG tablet Take 1 tablet (8 mg total) by mouth 3 (three) times daily  as needed for nausea or vomiting.   Marland Kitchen oxybutynin (DITROPAN) 5 MG tablet TAKE 1 TABLET(5 MG) BY MOUTH TWICE DAILY   . predniSONE (STERAPRED UNI-PAK 21 TAB) 10 MG (21) TBPK tablet 6 day taper - take by mouth as directed for 6 days   . rOPINIRole (REQUIP) 0.5 MG tablet Take 1 tablet (0.5 mg total) by mouth 2 (two) times daily. IF NEEDED FOR RESTLESS LEGS--NORMALLY JUST AT BEDTIME   . SUMAtriptan (IMITREX) 100 MG tablet Take 1 tablet (100 mg total) by mouth every 2 (two) hours as needed (for migraine headaches.). May repeat in 1 hours if headache persists or recurs.   . tamsulosin (FLOMAX) 0.4 MG CAPS capsule Take by mouth.   .  Tiotropium Bromide Monohydrate (SPIRIVA RESPIMAT) 2.5 MCG/ACT AERS Inhale 2.5 mcg into the lungs 2 (two) times daily.   . traMADol (ULTRAM) 50 MG tablet Take 1 tablet (50 mg total) by mouth every 6 (six) hours as needed (FOR PAIN.).   Marland Kitchen valACYclovir (VALTREX) 500 MG tablet Take 500 mg by mouth daily as needed (for fever blisters).   . DULoxetine (CYMBALTA) 60 MG capsule Take 1 capsule (60 mg total) by mouth daily for 14 days.   . traZODone (DESYREL) 150 MG tablet Take 1 tablet (150 mg total) by mouth at bedtime for 14 days.    No facility-administered encounter medications on file as of 09/13/2018.     Surgical History: Past Surgical History:  Procedure Laterality Date  . ABDOMINAL HYSTERECTOMY    . AMPUTATION TOE Left 07/31/2016   Procedure: AMPUTATION TOE/MPJ 2nd toe;  Surgeon: Sharlotte Alamo, DPM;  Location: [facility] ORS;  Service: Podiatry;  Laterality: Left;  . APPENDECTOMY  1990  . BACK SURGERY    . BREAST SURGERY     bilateral breast reduction  . CARDIAC ELECTROPHYSIOLOGY STUDY AND ABLATION    . CHOLECYSTECTOMY  1990  . COLONOSCOPY WITH PROPOFOL N/A 01/04/2017   Procedure: COLONOSCOPY WITH PROPOFOL;  Surgeon: Manya Silvas, MD;  Location: [facility] ENDOSCOPY;  Service: Endoscopy;  Laterality: N/A;  . CORNEAL TRANSPLANT    . ESOPHAGOGASTRODUODENOSCOPY (EGD) WITH PROPOFOL N/A 01/04/2017   Procedure: ESOPHAGOGASTRODUODENOSCOPY (EGD) WITH PROPOFOL;  Surgeon: Manya Silvas, MD;  Location: [facility] ENDOSCOPY;  Service: Endoscopy;  Laterality: N/A;  . EXCISION BONE CYST Left 07/31/2016   Procedure: EXCISION BONE CYST/exostectomy 28124/left 2nd;  Surgeon: Sharlotte Alamo, DPM;  Location: [facility] ORS;  Service: Podiatry;  Laterality: Left;  . EXTRACORPOREAL SHOCK WAVE LITHOTRIPSY Left 09/12/2015   Procedure: EXTRACORPOREAL SHOCK WAVE LITHOTRIPSY (ESWL);  Surgeon: Hollice Espy, MD;  Location: [facility] ORS;  Service: Urology;  Laterality: Left;  . FRACTURE SURGERY     left foot  . HH repair    . JOINT  REPLACEMENT Bilateral 2013,2014   total knees  . LAPAROSCOPIC HYSTERECTOMY    . LITHOTRIPSY    . TONSILLECTOMY    . URETEROSCOPY      Medical History: Past Medical History:  Diagnosis Date  . Acid reflux   . Anxiety   . Arrhythmia    treated with meds and has no current problems  . Arthritis    most uncomfortable in knees  . Asthma    uses several inhalers  . Depression   . Diabetes mellitus without complication (HCC)    sugars run low, not high sugars  . Fever blister   . Hematuria   . Hypertension   . Kidney stone    stones, 2nd lithotripsy  . Left flank pain   .  Migraine   . Restless leg   . Yeast vaginitis     Family History: Family History  Problem Relation Age of Onset  . Prostate cancer Father   . Kidney Stones Father   . Kidney disease Neg Hx     Social History   Socioeconomic History  . Marital status: Divorced    Spouse name: Not on file  . Number of children: Not on file  . Years of education: Not on file  . Highest education level: Not on file  Occupational History  . Not on file  Social Needs  . Financial resource strain: Not on file  . Food insecurity    Worry: Not on file    Inability: Not on file  . Transportation needs    Medical: Not on file    Non-medical: Not on file  Tobacco Use  . Smoking status: Never Smoker  . Smokeless tobacco: Never Used  Substance and Sexual Activity  . Alcohol use: No  . Drug use: No  . Sexual activity: Not on file  Lifestyle  . Physical activity    Days per week: Not on file    Minutes per session: Not on file  . Stress: Not on file  Relationships  . Social Herbalist on phone: Not on file    Gets together: Not on file    Attends religious service: Not on file    Active member of club or organization: Not on file    Attends meetings of clubs or organizations: Not on file    Relationship status: Not on file  . Intimate partner violence    Fear of current or ex partner: Not on file     Emotionally abused: Not on file    Physically abused: Not on file    Forced sexual activity: Not on file  Other Topics Concern  . Not on file  Social History Narrative  . Not on file      Review of Systems  Constitutional: Negative for chills, fatigue and unexpected weight change.  HENT: Negative for congestion, rhinorrhea, sneezing and sore throat.   Eyes: Negative for photophobia, pain and redness.  Respiratory: Negative for cough, chest tightness and shortness of breath.   Cardiovascular: Negative for chest pain and palpitations.  Gastrointestinal: Negative for abdominal pain, constipation, diarrhea, nausea and vomiting.  Endocrine: Negative.   Genitourinary: Negative for dysuria and frequency.  Musculoskeletal: Negative for arthralgias, back pain, joint swelling and neck pain.  Skin: Negative for rash.  Allergic/Immunologic: Negative.   Neurological: Negative for tremors and numbness.  Hematological: Negative for adenopathy. Does not bruise/bleed easily.  Psychiatric/Behavioral: Negative for behavioral problems and sleep disturbance. The patient is not nervous/anxious.     Vital Signs: BP (!) 103/55   Pulse 69   Ht 5\' 3"  (1.6 m)   Wt 170 lb (77.1 kg)   BMI 30.11 kg/m    Observation/Objective:  Well sounding.  NAD noted at this time.    Assessment/Plan: 1. Acute upper respiratory infection Resolving, no fever or increased sob at this time.  Pt has completed two course of antibiotics, I believe her residual shortness of breath could be due to deconditioning, as well as lingering inflammation.  If new symptoms worsen she is instructed to call office.   2. Chronic obstructive pulmonary disease, unspecified COPD type ([place]) Stable, continue inhalers and nebulizers as directed.   3. Essential hypertension Stable, continue present management.   General Counseling: Grecia verbalizes understanding  of the findings of today's phone visit and agrees with plan of treatment.  I have discussed any further diagnostic evaluation that may be needed or ordered today. We also reviewed her medications today. she has been encouraged to call the office with any questions or concerns that should arise related to todays visit.    No orders of the defined types were placed in this encounter.   No orders of the defined types were placed in this encounter.   Time spent: [facility] Internal medicine

## 2018-09-15 NOTE — Patient Instructions (Signed)
Asthma, Adult    Asthma is a long-term (chronic) condition that causes recurrent episodes in which the airways become tight and narrow. The airways are the passages that lead from the nose and mouth down into the lungs. Asthma episodes, also called asthma attacks, can cause coughing, wheezing, shortness of breath, and chest pain. The airways can also fill with mucus. During an attack, it can be difficult to breathe. Asthma attacks can range from minor to life threatening.  Asthma cannot be cured, but medicines and lifestyle changes can help control it and treat acute attacks.  What are the causes?  This condition is believed to be caused by inherited (genetic) and environmental factors, but its exact cause is not known.  There are many things that can bring on an asthma attack or make asthma symptoms worse (triggers). Asthma triggers are different for each person. Common triggers include:   Mold.   Dust.   Cigarette smoke.   Cockroaches.   Things that can cause allergy symptoms (allergens), such as animal dander or pollen from trees or grass.   Air pollutants such as household cleaners, wood smoke, smog, or chemical odors.   Cold air, weather changes, and winds (which increase molds and pollen in the air).   Strong emotional expressions such as crying or laughing hard.   Stress.   Certain medicines (such as aspirin) or types of medicines (such as beta-blockers).   Sulfites in foods and drinks. Foods and drinks that may contain sulfites include dried fruit, potato chips, and sparkling grape juice.   Infections or inflammatory conditions such as the flu, a cold, or inflammation of the nasal membranes (rhinitis).   Gastroesophageal reflux disease (GERD).   Exercise or strenuous activity.  What are the signs or symptoms?  Symptoms of this condition may occur right after asthma is triggered or many hours later. Symptoms include:   Wheezing. This can sound like whistling when you breathe.   Excessive  nighttime or early morning coughing.   Frequent or severe coughing with a common cold.   Chest tightness.   Shortness of breath.   Tiredness (fatigue) with minimal activity.  How is this diagnosed?  This condition is diagnosed based on:   Your medical history.   A physical exam.   Tests, which may include:  ? Lung function studies and pulmonary studies (spirometry). These tests can evaluate the flow of air in your lungs.  ? Allergy tests.  ? Imaging tests, such as X-rays.  How is this treated?  There is no cure for this condition, but treatment can help control your symptoms. Treatment for asthma usually involves:   Identifying and avoiding your asthma triggers.   Using medicines to control your symptoms. Generally, two types of medicines are used to treat asthma:  ? Controller medicines. These help prevent asthma symptoms from occurring. They are usually taken every day.  ? Fast-acting reliever or rescue medicines. These quickly relieve asthma symptoms by widening the narrow and tight airways. They are used as needed and provide short-term relief.   Using supplemental oxygen. This may be needed during a severe episode.   Using other medicines, such as:  ? Allergy medicines, such as antihistamines, if your asthma attacks are triggered by allergens.  ? Immune medicines (immunomodulators). These are medicines that help control the immune system.   Creating an asthma action plan. An asthma action plan is a written plan for managing and treating your asthma attacks. This plan includes:  ? A list   of your asthma triggers and how to avoid them.  ? Information about when medicines should be taken and when their dosage should be changed.  ? Instructions about using a device called a peak flow meter. A peak flow meter measures how well the lungs are working and the severity of your asthma. It helps you monitor your condition.  Follow these instructions at home:  Controlling your home environment  Control your home  environment in the following ways to help avoid triggers and prevent asthma attacks:   Change your heating and air conditioning filter regularly.   Limit your use of fireplaces and wood stoves.   Get rid of pests (such as roaches and mice) and their droppings.   Throw away plants if you see mold on them.   Clean floors and dust surfaces regularly. Use unscented cleaning products.   Try to have someone else vacuum for you regularly. Stay out of rooms while they are being vacuumed and for a short while afterward. If you vacuum, use a dust mask from a hardware store, a double-layered or microfilter vacuum cleaner bag, or a vacuum cleaner with a HEPA filter.   Replace carpet with wood, tile, or vinyl flooring. Carpet can trap dander and dust.   Use allergy-proof pillows, mattress covers, and box spring covers.   Keep your bedroom a trigger-free room.   Avoid pets and keep windows closed when allergens are in the air.   Wash beddings every week in hot water and dry them in a dryer.   Use blankets that are made of polyester or cotton.   Clean bathrooms and kitchens with bleach. If possible, have someone repaint the walls in these rooms with mold-resistant paint. Stay out of the rooms that are being cleaned and painted.   Wash your hands often with soap and water. If soap and water are not available, use hand sanitizer.   Do not allow anyone to smoke in your home.  General instructions   Take over-the-counter and prescription medicines only as told by your health care provider.  ? Speak with your health care provider if you have questions about how or when to take the medicines.  ? Make note if you are requiring more frequent dosages.   Do not use any products that contain nicotine or tobacco, such as cigarettes and e-cigarettes. If you need help quitting, ask your health care provider. Also, avoid being exposed to secondhand smoke.   Use a peak flow meter as told by your health care provider. Record and  keep track of the readings.   Understand and use the asthma action plan to help minimize, or stop an asthma attack, without needing to seek medical care.   Make sure you stay up to date on your yearly vaccinations as told by your health care provider. This may include vaccines for the flu and pneumonia.   Avoid outdoor activities when allergen counts are high and when air quality is low.   Wear a ski mask that covers your nose and mouth during outdoor winter activities. Exercise indoors on cold days if you can.   Warm up before exercising, and take time for a cool-down period after exercise.   Keep all follow-up visits as told by your health care provider. This is important.  Where to find more information   For information about asthma, turn to the Centers for Disease Control and Prevention at www.cdc.gov/asthma/faqs.htm   For air quality information, turn to AirNow at https://airnow.gov/  Contact   a health care provider if:   You have wheezing, shortness of breath, or a cough even while you are taking medicine to prevent attacks.   The mucus you cough up (sputum) is thicker than usual.   Your sputum changes from clear or white to yellow, green, gray, or bloody.   Your medicines are causing side effects, such as a rash, itching, swelling, or trouble breathing.   You need to use a reliever medicine more than 2-3 times a week.   Your peak flow reading is still at 50-79% of your personal best after following your action plan for 1 hour.   You have a fever.  Get help right away if:   You are getting worse and do not respond to treatment during an asthma attack.   You are short of breath when at rest or when doing very little physical activity.   You have difficulty eating, drinking, or talking.   You have chest pain or tightness.   You develop a fast heartbeat or palpitations.   You have a bluish color to your lips or fingernails.   You are light-headed or dizzy, or you faint.   Your peak flow  reading is less than 50% of your personal best.   You feel too tired to breathe normally.  Summary   Asthma is a long-term (chronic) condition that causes recurrent episodes in which the airways become tight and narrow. These episodes can cause coughing, wheezing, shortness of breath, and chest pain.   Asthma cannot be cured, but medicines and lifestyle changes can help control it and treat acute attacks.   Make sure you understand how to avoid triggers and how and when to use your medicines.   Asthma attacks can range from minor to life threatening. Get help right away if you have an asthma attack and do not respond to treatment with your usual rescue medicines.  This information is not intended to replace advice given to you by your health care provider. Make sure you discuss any questions you have with your health care provider.  Document Released: 03/09/2005 Document Revised: 04/13/2016 Document Reviewed: 04/13/2016  [employer]vier Interactive Patient Education  2019 [employer]

## 2018-09-28 ENCOUNTER — Emergency Department: Payer: Medicare Other

## 2018-09-28 ENCOUNTER — Other Ambulatory Visit: Payer: Self-pay

## 2018-09-28 ENCOUNTER — Inpatient Hospital Stay
Admission: EM | Admit: 2018-09-28 | Discharge: 2018-09-30 | DRG: 191 | Disposition: A | Discharge status: Home / Self Care | Payer: Medicare Other | Source: Ambulatory Visit | Attending: Internal Medicine | Admitting: Internal Medicine

## 2018-09-28 ENCOUNTER — Encounter: Payer: Self-pay | Admitting: Internal Medicine

## 2018-09-28 ENCOUNTER — Ambulatory Visit (INDEPENDENT_AMBULATORY_CARE_PROVIDER_SITE_OTHER): Payer: Medicare Other | Admitting: Internal Medicine

## 2018-09-28 VITALS — BP 113/78 | HR 89 | Temp 98.1°F | Resp 16 | Ht 63.0 in | Wt 166.0 lb

## 2018-09-28 DIAGNOSIS — G43909 Migraine, unspecified, not intractable, without status migrainosus: Secondary | ICD-10-CM | POA: Diagnosis present

## 2018-09-28 DIAGNOSIS — G2581 Restless legs syndrome: Secondary | ICD-10-CM | POA: Diagnosis present

## 2018-09-28 DIAGNOSIS — F332 Major depressive disorder, recurrent severe without psychotic features: Secondary | ICD-10-CM | POA: Diagnosis present

## 2018-09-28 DIAGNOSIS — Z947 Corneal transplant status: Secondary | ICD-10-CM | POA: Diagnosis not present

## 2018-09-28 DIAGNOSIS — Z96653 Presence of artificial knee joint, bilateral: Secondary | ICD-10-CM | POA: Diagnosis present

## 2018-09-28 DIAGNOSIS — J4541 Moderate persistent asthma with (acute) exacerbation: Secondary | ICD-10-CM

## 2018-09-28 DIAGNOSIS — Z89422 Acquired absence of other left toe(s): Secondary | ICD-10-CM

## 2018-09-28 DIAGNOSIS — J441 Chronic obstructive pulmonary disease with (acute) exacerbation: Secondary | ICD-10-CM | POA: Diagnosis not present

## 2018-09-28 DIAGNOSIS — Z881 Allergy status to other antibiotic agents status: Secondary | ICD-10-CM | POA: Diagnosis not present

## 2018-09-28 DIAGNOSIS — F419 Anxiety disorder, unspecified: Secondary | ICD-10-CM | POA: Diagnosis present

## 2018-09-28 DIAGNOSIS — R0602 Shortness of breath: Secondary | ICD-10-CM

## 2018-09-28 DIAGNOSIS — K219 Gastro-esophageal reflux disease without esophagitis: Secondary | ICD-10-CM | POA: Diagnosis present

## 2018-09-28 DIAGNOSIS — R0603 Acute respiratory distress: Secondary | ICD-10-CM | POA: Diagnosis present

## 2018-09-28 DIAGNOSIS — I10 Essential (primary) hypertension: Secondary | ICD-10-CM | POA: Diagnosis present

## 2018-09-28 DIAGNOSIS — R531 Weakness: Secondary | ICD-10-CM | POA: Diagnosis not present

## 2018-09-28 DIAGNOSIS — Z20828 Contact with and (suspected) exposure to other viral communicable diseases: Secondary | ICD-10-CM | POA: Diagnosis present

## 2018-09-28 LAB — COMPREHENSIVE METABOLIC PANEL
ALT: 28 U/L (ref 0–44)
AST: 25 U/L (ref 15–41)
Albumin: 3.8 g/dL (ref 3.5–5.0)
Alkaline Phosphatase: 88 U/L (ref 38–126)
Anion gap: 11 (ref 5–15)
BUN: 18 mg/dL (ref 8–23)
CO2: 23 mmol/L (ref 22–32)
Calcium: 9.1 mg/dL (ref 8.9–10.3)
Chloride: 106 mmol/L (ref 98–111)
Creatinine, Ser: 0.79 mg/dL (ref 0.44–1.00)
GFR calc Af Amer: >60 mL/min (ref >60)
GFR calc non Af Amer: >60 mL/min (ref >60)
Glucose, Bld: 123 mg/dL — ABNORMAL HIGH (ref 70–99)
Potassium: 4 mmol/L (ref 3.5–5.1)
Sodium: 140 mmol/L (ref 135–145)
Total Bilirubin: 0.3 mg/dL (ref 0.3–1.2)
Total Protein: 6.2 g/dL — ABNORMAL LOW (ref 6.5–8.1)

## 2018-09-28 LAB — CBC WITH DIFFERENTIAL/PLATELET
Abs Immature Granulocytes: 0.01 10*3/uL (ref 0.00–0.07)
Basophils Absolute: 0.1 10*3/uL (ref 0.0–0.1)
Basophils Relative: 1 %
Eosinophils Absolute: 0.4 10*3/uL (ref 0.0–0.5)
Eosinophils Relative: 6 %
HCT: 38.8 % (ref 36.0–46.0)
Hemoglobin: 13.1 g/dL (ref 12.0–15.0)
Immature Granulocytes: 0 %
Lymphocytes Relative: 30 %
Lymphs Abs: 2.3 10*3/uL (ref 0.7–4.0)
MCH: 30.4 pg (ref 26.0–34.0)
MCHC: 33.8 g/dL (ref 30.0–36.0)
MCV: 90 fL (ref 80.0–100.0)
Monocytes Absolute: 0.5 10*3/uL (ref 0.1–1.0)
Monocytes Relative: 6 %
Neutro Abs: 4.3 10*3/uL (ref 1.7–7.7)
Neutrophils Relative %: 57 %
Platelets: 227 10*3/uL (ref 150–400)
RBC: 4.31 MIL/uL (ref 3.87–5.11)
RDW: 12.8 % (ref 11.5–15.5)
WBC: 7.6 10*3/uL (ref 4.0–10.5)
nRBC: 0 % (ref 0.0–0.2)

## 2018-09-28 LAB — SARS CORONAVIRUS 2 BY RT PCR (HOSPITAL ORDER, PERFORMED IN ~~LOC~~ HOSPITAL LAB): SARS Coronavirus 2: NEGATIVE

## 2018-09-28 LAB — BRAIN NATRIURETIC PEPTIDE: B Natriuretic Peptide: 45 pg/mL (ref 0.0–100.0)

## 2018-09-28 MED ORDER — DILTIAZEM HCL ER COATED BEADS 180 MG PO CP24
180.0000 mg | ORAL_CAPSULE | Freq: Every day | ORAL | Status: DC
  Administered 2018-09-29 – 2018-09-30 (×2): 180 mg via ORAL
  Filled 2018-09-28 (×2): qty 1

## 2018-09-28 MED ORDER — ROPINIROLE HCL 1 MG PO TABS
0.5000 mg | ORAL_TABLET | Freq: Two times a day (BID) | ORAL | Status: DC | PRN
  Administered 2018-09-29: 0.5 mg via ORAL
  Filled 2018-09-28: qty 1

## 2018-09-28 MED ORDER — HYDROXYZINE HCL 10 MG PO TABS
10.0000 mg | ORAL_TABLET | Freq: Three times a day (TID) | ORAL | Status: DC | PRN
  Filled 2018-09-28: qty 1

## 2018-09-28 MED ORDER — TRAZODONE HCL 50 MG PO TABS
50.0000 mg | ORAL_TABLET | Freq: Every day | ORAL | Status: DC
  Administered 2018-09-29 (×2): 50 mg via ORAL
  Filled 2018-09-28 (×2): qty 1

## 2018-09-28 MED ORDER — DOXYCYCLINE HYCLATE 100 MG PO TABS
100.0000 mg | ORAL_TABLET | Freq: Two times a day (BID) | ORAL | Status: DC
  Administered 2018-09-29 – 2018-09-30 (×4): 100 mg via ORAL
  Filled 2018-09-28 (×4): qty 1

## 2018-09-28 MED ORDER — PROCHLORPERAZINE EDISYLATE 10 MG/2ML IJ SOLN
10.0000 mg | Freq: Once | INTRAMUSCULAR | Status: AC
  Administered 2018-09-28: 10 mg via INTRAVENOUS
  Filled 2018-09-28: qty 2

## 2018-09-28 MED ORDER — ONDANSETRON HCL 4 MG PO TABS: 4.0000 mg | ORAL_TABLET | Freq: Four times a day (QID) | ORAL | Status: DC | PRN

## 2018-09-28 MED ORDER — ALBUTEROL SULFATE (2.5 MG/3ML) 0.083% IN NEBU
5.0000 mg | INHALATION_SOLUTION | Freq: Once | RESPIRATORY_TRACT | Status: AC
  Administered 2018-09-28: 5 mg via RESPIRATORY_TRACT
  Filled 2018-09-28: qty 6

## 2018-09-28 MED ORDER — DIPHENHYDRAMINE HCL 50 MG/ML IJ SOLN
25.0000 mg | Freq: Once | INTRAMUSCULAR | Status: AC
  Administered 2018-09-28: 25 mg via INTRAVENOUS
  Filled 2018-09-28: qty 1

## 2018-09-28 MED ORDER — METHYLPREDNISOLONE ACETATE 80 MG/ML IJ SUSP
80.0000 mg | Freq: Once | INTRAMUSCULAR | Status: AC
  Administered 2018-09-28: 80 mg via INTRAMUSCULAR

## 2018-09-28 MED ORDER — SODIUM CHLORIDE 0.9 % IV BOLUS
1000.0000 mL | Freq: Once | INTRAVENOUS | Status: AC
  Administered 2018-09-28: 1000 mL via INTRAVENOUS

## 2018-09-28 MED ORDER — METHYLPREDNISOLONE SODIUM SUCC 125 MG IJ SOLR
125.0000 mg | Freq: Once | INTRAMUSCULAR | Status: AC
  Administered 2018-09-28: 125 mg via INTRAVENOUS
  Filled 2018-09-28: qty 2

## 2018-09-28 MED ORDER — COMBIVENT RESPIMAT 20-100 MCG/ACT IN AERS: 1.0000 | INHALATION_SPRAY | Freq: Four times a day (QID) | RESPIRATORY_TRACT | 6 refills | Status: DC

## 2018-09-28 MED ORDER — ACETAMINOPHEN 650 MG RE SUPP: 650.0000 mg | Freq: Four times a day (QID) | RECTAL | Status: DC | PRN

## 2018-09-28 MED ORDER — SODIUM CHLORIDE 0.9% FLUSH
3.0000 mL | Freq: Two times a day (BID) | INTRAVENOUS | Status: DC
  Administered 2018-09-29 – 2018-09-30 (×5): 3 mL via INTRAVENOUS

## 2018-09-28 MED ORDER — ALBUTEROL (5 MG/ML) CONTINUOUS INHALATION SOLN
10.0000 mg/h | INHALATION_SOLUTION | Freq: Once | RESPIRATORY_TRACT | Status: AC
  Administered 2018-09-28: 10 mg/h via RESPIRATORY_TRACT
  Filled 2018-09-28: qty 20

## 2018-09-28 MED ORDER — ENOXAPARIN SODIUM 40 MG/0.4ML ~~LOC~~ SOLN
40.0000 mg | SUBCUTANEOUS | Status: DC
  Administered 2018-09-29: 40 mg via SUBCUTANEOUS
  Filled 2018-09-28: qty 0.4

## 2018-09-28 MED ORDER — IPRATROPIUM-ALBUTEROL 0.5-2.5 (3) MG/3ML IN SOLN
3.0000 mL | Freq: Four times a day (QID) | RESPIRATORY_TRACT | Status: DC
  Administered 2018-09-29 – 2018-09-30 (×5): 3 mL via RESPIRATORY_TRACT
  Filled 2018-09-28 (×6): qty 3

## 2018-09-28 MED ORDER — METHYLPREDNISOLONE ACETATE 80 MG/ML IJ SUSP: INTRAMUSCULAR | 1 refills | Status: DC

## 2018-09-28 MED ORDER — MOMETASONE FURO-FORMOTEROL FUM 200-5 MCG/ACT IN AERO
2.0000 | INHALATION_SPRAY | Freq: Two times a day (BID) | RESPIRATORY_TRACT | Status: DC
  Administered 2018-09-29 – 2018-09-30 (×4): 2 via RESPIRATORY_TRACT
  Filled 2018-09-28: qty 8.8

## 2018-09-28 MED ORDER — DULOXETINE HCL 30 MG PO CPEP
30.0000 mg | ORAL_CAPSULE | Freq: Every day | ORAL | Status: DC
  Administered 2018-09-29 (×2): 30 mg via ORAL
  Filled 2018-09-28 (×2): qty 1

## 2018-09-28 MED ORDER — MAGNESIUM SULFATE 2 GM/50ML IV SOLN
2.0000 g | Freq: Once | INTRAVENOUS | Status: AC
  Administered 2018-09-28: 2 g via INTRAVENOUS
  Filled 2018-09-28: qty 50

## 2018-09-28 MED ORDER — IPRATROPIUM-ALBUTEROL 0.5-2.5 (3) MG/3ML IN SOLN: 3.0000 mL | RESPIRATORY_TRACT | Status: DC | PRN

## 2018-09-28 MED ORDER — METHYLPREDNISOLONE SODIUM SUCC 125 MG IJ SOLR
60.0000 mg | Freq: Four times a day (QID) | INTRAMUSCULAR | Status: DC
  Administered 2018-09-29 (×3): 60 mg via INTRAVENOUS
  Filled 2018-09-28 (×3): qty 2

## 2018-09-28 MED ORDER — ACETAMINOPHEN 325 MG PO TABS
650.0000 mg | ORAL_TABLET | Freq: Four times a day (QID) | ORAL | Status: DC | PRN
  Administered 2018-09-29 (×2): 650 mg via ORAL
  Filled 2018-09-28 (×2): qty 2

## 2018-09-28 MED ORDER — BREXPIPRAZOLE 1 MG PO TABS
4.0000 mg | ORAL_TABLET | Freq: Every day | ORAL | Status: DC
  Administered 2018-09-29 (×2): 4 mg via ORAL
  Filled 2018-09-28 (×2): qty 4

## 2018-09-28 MED ORDER — MONTELUKAST SODIUM 10 MG PO TABS
10.0000 mg | ORAL_TABLET | Freq: Every day | ORAL | Status: DC
  Administered 2018-09-29 (×2): 10 mg via ORAL
  Filled 2018-09-28 (×2): qty 1

## 2018-09-28 MED ORDER — ONDANSETRON HCL 4 MG/2ML IJ SOLN: 4.0000 mg | Freq: Four times a day (QID) | INTRAMUSCULAR | Status: DC | PRN

## 2018-09-28 MED ORDER — ALPRAZOLAM 0.25 MG PO TABS
0.2500 mg | ORAL_TABLET | Freq: Two times a day (BID) | ORAL | Status: DC | PRN
  Administered 2018-09-29 (×2): 0.25 mg via ORAL
  Filled 2018-09-28 (×2): qty 1

## 2018-09-28 MED ORDER — BUSPIRONE HCL 10 MG PO TABS
10.0000 mg | ORAL_TABLET | Freq: Three times a day (TID) | ORAL | Status: DC
  Administered 2018-09-29 – 2018-09-30 (×5): 10 mg via ORAL
  Filled 2018-09-28 (×7): qty 1

## 2018-09-28 MED ORDER — GUAIFENESIN-DM 100-10 MG/5ML PO SYRP: 5.0000 mL | ORAL_SOLUTION | ORAL | Status: DC | PRN

## 2018-09-28 MED ORDER — IPRATROPIUM BROMIDE 0.02 % IN SOLN
1.0000 mg | Freq: Once | RESPIRATORY_TRACT | Status: AC
  Administered 2018-09-28: 1 mg via RESPIRATORY_TRACT
  Filled 2018-09-28: qty 5

## 2018-09-28 NOTE — ED Triage Notes (Signed)
Pt c/o increased SOB over the past 2 weeks with asthma, states she has been seeing her PCP but to day they gave her a steroid injection and said if she was still SOB to come to the ED. Pt is tachypneic in triage. sats 97% in triage.

## 2018-09-28 NOTE — ED Provider Notes (Signed)
[facility] Emergency Department Provider Note  ____________________________________________   First MD Initiated Contact with Patient 09/28/18 1546     (approximate)  I have reviewed the triage vital signs and the nursing notes.   HISTORY  Chief Complaint Shortness of Breath    HPI Summer Murphy is a 63 y.o. female with past medical history as below including severe asthma currently followed by pulmonology here with shortness of breath.  The patient states that for the last 2 and half weeks, she said progressively worsening wheezing and shortness of breath.  She had a dry cough.  She is seen her pulmonologist and has been on outpatient steroids as well as antibiotics.  She was seen just recently, and given an IM dose of steroids and told to come to the ER if her symptoms not improved.  Her last several hours, her symptoms have worsened and she subsequent presents for evaluation.  She endorses severe shortness of breath and difficulty just walking across the room.  She even has shortness of breath with talking.  No fevers.  She said no sputum production.  She denies any known COVID exposures.  No leg swelling.  Denies any sputum production.  Symptoms feel similar to her prior asthma exacerbations, though significantly worse than usual.  She has had to be admitted multiple times for this, and has been in the ICU as well though declines history of intubation.        Past Medical History:  Diagnosis Date  . Acid reflux   . Anxiety   . Arrhythmia    treated with meds and has no current problems  . Arthritis    most uncomfortable in knees  . Asthma    uses several inhalers  . Depression   . Diabetes mellitus without complication (HCC)    sugars run low, not high sugars  . Fever blister   . Hematuria   . Hypertension   . Kidney stone    stones, 2nd lithotripsy  . Left flank pain   . Migraine   . Restless leg   . Yeast vaginitis     Patient Active  Problem List   Diagnosis Date Noted  . Acute upper respiratory infection 08/25/2018  . Recurrent sinusitis 08/15/2018  . Prediabetes 08/15/2018  . Nonintractable headache 08/15/2018  . Dysuria 08/15/2018  . Intractable migraine without status migrainosus 04/22/2018  . Nausea 04/22/2018  . Polyneuropathy associated with underlying disease ([place]) 04/22/2018  . Midline low back pain without sciatica 04/22/2018  . Routine cervical smear 04/22/2018  . Hypertension 07/01/2017  . Acute bronchitis with asthma 07/01/2017  . Allergic rhinitis 07/01/2017  . Asthma 01/20/2016  . Severe recurrent major depression without psychotic features ([place]) 09/10/2014  . COPD (chronic obstructive pulmonary disease) ([place]) 09/10/2014  . Calculi, ureter 04/26/2013  . Corneal graft malfunction 08/17/2012  . Calculus of kidney 04/26/2012  . Renal colic 58/85/0277  . Urge incontinence 04/26/2012  . Diaphragmatic hernia 10/27/2010  . Barrett esophagus 07/07/2010    Past Surgical History:  Procedure Laterality Date  . ABDOMINAL HYSTERECTOMY    . AMPUTATION TOE Left 07/31/2016   Procedure: AMPUTATION TOE/MPJ 2nd toe;  Surgeon: Sharlotte Alamo, DPM;  Location: [facility] ORS;  Service: Podiatry;  Laterality: Left;  . APPENDECTOMY  1990  . BACK SURGERY    . BREAST SURGERY     bilateral breast reduction  . CARDIAC ELECTROPHYSIOLOGY STUDY AND ABLATION    . CHOLECYSTECTOMY  1990  . COLONOSCOPY WITH PROPOFOL  N/A 01/04/2017   Procedure: COLONOSCOPY WITH PROPOFOL;  Surgeon: Manya Silvas, MD;  Location[facility] ENDOSCOPY;  Service: Endoscopy;  Laterality: N/A;  . CORNEAL TRANSPLANT    . ESOPHAGOGASTRODUODENOSCOPY (EGD) WITH PROPOFOL N/A 01/04/2017   Procedure: ESOPHAGOGASTRODUODENOSCOPY (EGD) WITH PROPOFOL;  Surgeon: Manya Silvas, MD;  Location: [facility] ENDOSCOPY;  Service: Endoscopy;  Laterality: N/A;  . EXCISION BONE CYST Left 07/31/2016   Procedure: EXCISION BONE CYST/exostectomy 28124/left 2nd;  Surgeon: Sharlotte Alamo,  DPM;  Location: [facility] ORS;  Service: Podiatry;  Laterality: Left;  . EXTRACORPOREAL SHOCK WAVE LITHOTRIPSY Left 09/12/2015   Procedure: EXTRACORPOREAL SHOCK WAVE LITHOTRIPSY (ESWL);  Surgeon: Hollice Espy, MD;  Location: [facility] ORS;  Service: Urology;  Laterality: Left;  . FRACTURE SURGERY     left foot  . HH repair    . JOINT REPLACEMENT Bilateral 2013,2014   total knees  . LAPAROSCOPIC HYSTERECTOMY    . LITHOTRIPSY    . TONSILLECTOMY    . URETEROSCOPY      Prior to Admission medications   Medication Sig Start Date End Date Taking? Authorizing Provider  ALPRAZolam (XANAX) 0.25 MG tablet Take 0.25 mg by mouth 2 (two) times daily as needed for anxiety.   Yes [provider]  azelastine (ASTELIN) 0.1 % nasal spray once as needed. 08/22/15  Yes [provider]  Brexpiprazole (REXULTI) 4 MG TABS Take 4 mg by mouth at bedtime.    Yes [provider]  budesonide-formoterol (SYMBICORT) 160-4.5 MCG/ACT inhaler Inhale 2 puffs into the lungs 2 (two) times daily as needed (for respiratory issues.). 05/02/18  Yes Scarboro, Audie Clear, NP  busPIRone (BUSPAR) 10 MG tablet Take 10 mg by mouth 3 (three) times daily. 07/13/18  Yes [provider]  diclofenac sodium (VOLTAREN) 1 % GEL Apply 2 g topically 2 (two) times daily as needed (for pain).    Yes [provider]  diltiazem (DILACOR XR) 180 MG 24 hr capsule Take 180 mg by mouth daily. 04/26/12  Yes [provider]  DULoxetine (CYMBALTA) 30 MG capsule Take 30 mg by mouth at bedtime.   Yes [provider]  gabapentin (NEURONTIN) 600 MG tablet Take 1 tablet (600 mg total) by mouth 2 (two) times daily. 08/11/18  Yes Boscia, Heather E, NP  Ipratropium-Albuterol (COMBIVENT RESPIMAT) 20-100 MCG/ACT AERS respimat Inhale 1 puff into the lungs every 6 (six) hours. 09/28/18  Yes Lavera Guise, MD  ipratropium-albuterol (DUONEB) 0.5-2.5 (3) MG/3ML SOLN Take 3 mLs by nebulization every 4 (four) hours as needed (for  shortness of breath/wheezing).    Yes [provider]  montelukast (SINGULAIR) 10 MG tablet Take 1 tablet (10 mg total) by mouth at bedtime. 07/19/18  Yes Scarboro, Audie Clear, NP  omeprazole (PRILOSEC) 40 MG capsule Take 1 capsule (40 mg total) by mouth daily. 04/22/18  Yes Boscia, Heather E, NP  oxybutynin (DITROPAN) 5 MG tablet TAKE 1 TABLET(5 MG) BY MOUTH TWICE DAILY 09/08/18  Yes Scarboro, Audie Clear, NP  rOPINIRole (REQUIP) 0.5 MG tablet Take 1 tablet (0.5 mg total) by mouth 2 (two) times daily. IF NEEDED FOR RESTLESS LEGS--NORMALLY JUST AT BEDTIME 09/08/18  Yes Boscia, Heather E, NP  tamsulosin (FLOMAX) 0.4 MG CAPS capsule Take 0.4 mg by mouth daily.  04/26/13  Yes [provider]  traZODone (DESYREL) 50 MG tablet Take 50 mg by mouth at bedtime.   Yes [provider]  valACYclovir (VALTREX) 500 MG tablet Take 500 mg by mouth daily as needed (for fever blisters).  Yes [provider]  cyclobenzaprine (FLEXERIL) 10 MG tablet Take 1 tablet (10 mg total) by mouth 2 (two) times daily as needed for muscle spasms. Patient not taking: Reported on 09/28/2018 04/22/18   Ronnell Freshwater, NP  diphenhydrAMINE (BENADRYL) 25 MG tablet Take 50 mg by mouth 2 (two) times daily as needed.     [provider]  estrogens, conjugated, (PREMARIN) 0.45 MG tablet Take 0.45 mg by mouth daily. 04/26/12   [provider]  hydrOXYzine (ATARAX/VISTARIL) 10 MG tablet Take 1 tablet (10 mg total) by mouth 3 (three) times daily as needed. 05/10/18   Kendell Bane, NP  levocetirizine (XYZAL) 5 MG tablet Take 5 mg by mouth daily. 01/22/14   [provider]  methylPREDNISolone acetate (DEPO-MEDROL) 80 MG/ML injection Take 1 ml every day for asthma Patient not taking: Reported on 09/28/2018 09/28/18   Lavera Guise, MD  metoprolol succinate (TOPROL-XL) 25 MG 24 hr tablet TAKE 1 TABLET(25 MG) BY MOUTH DAILY Patient not taking: Reported on 09/28/2018 05/10/18   Kendell Bane, NP   SUMAtriptan (IMITREX) 100 MG tablet Take 1 tablet (100 mg total) by mouth every 2 (two) hours as needed (for migraine headaches.). May repeat in 1 hours if headache persists or recurs. 04/22/18   Ronnell Freshwater, NP    Allergies Librium [chlordiazepoxide], Metoclopramide, Nsaids, Vanilla, Aspirin, Azithromycin, Buprenorphine hcl, Chlordiazepoxide hcl, Iron, Morphine, Morphine and related, Sulfa antibiotics, Tolmetin, and Amlodipine besylate  Family History  Problem Relation Age of Onset  . Prostate cancer Father   . Kidney Stones Father   . Kidney disease Neg Hx     Social History Social History   Tobacco Use  . Smoking status: Never Smoker  . Smokeless tobacco: Never Used  Substance Use Topics  . Alcohol use: No  . Drug use: No    Review of Systems  Review of Systems  Constitutional: Positive for fatigue. Negative for fever.  HENT: Negative for congestion and sore throat.   Eyes: Negative for visual disturbance.  Respiratory: Positive for cough, shortness of breath and wheezing.   Cardiovascular: Negative for chest pain.  Gastrointestinal: Negative for abdominal pain, diarrhea, nausea and vomiting.  Genitourinary: Negative for flank pain.  Musculoskeletal: Negative for back pain and neck pain.  Skin: Negative for rash and wound.  Neurological: Positive for weakness.  All other systems reviewed and are negative.    ____________________________________________  PHYSICAL EXAM:      VITAL SIGNS: ED Triage Vitals  Enc Vitals Group     BP 09/28/18 1521 (!) 149/122     Pulse Rate 09/28/18 1521 70     Resp 09/28/18 1521 (!) 26     Temp 09/28/18 1521 98.8 F (37.1 C)     Temp Source 09/28/18 1521 Oral     SpO2 09/28/18 1521 97 %     Weight 09/28/18 1522 166 lb (75.3 kg)     Height 09/28/18 1522 5\' 3"  (1.6 m)     Head Circumference --      Peak Flow --      Pain Score 09/28/18 1522 9     Pain Loc --      Pain Edu? --      Excl. in [place]? --      Physical  Exam Vitals signs and nursing note reviewed.  Constitutional:      General: She is not in acute distress.    Appearance: She is well-developed.  HENT:  Head: Normocephalic and atraumatic.  Eyes:     Conjunctiva/sclera: Conjunctivae normal.  Neck:     Musculoskeletal: Neck supple.  Cardiovascular:     Rate and Rhythm: Normal rate and regular rhythm.     Heart sounds: Normal heart sounds. No murmur. No friction rub.  Pulmonary:     Effort: Tachypnea, accessory muscle usage and respiratory distress present.     Breath sounds: Decreased air movement present. Examination of the right-upper field reveals wheezing. Examination of the left-upper field reveals wheezing. Examination of the right-middle field reveals wheezing. Examination of the left-middle field reveals wheezing. Examination of the right-lower field reveals wheezing. Examination of the left-lower field reveals wheezing. Decreased breath sounds and wheezing present. No rales.     Comments: Speaking in short, truncated sentences Abdominal:     General: There is no distension.     Palpations: Abdomen is soft.     Tenderness: There is no abdominal tenderness.  Musculoskeletal:     Right lower leg: No edema.     Left lower leg: No edema.  Skin:    General: Skin is warm.     Capillary Refill: Capillary refill takes less than 2 seconds.  Neurological:     Mental Status: She is alert and oriented to person, place, and time.     Motor: No abnormal muscle tone.       ____________________________________________   LABS (all labs ordered are listed, but only abnormal results are displayed)  Labs Reviewed  COMPREHENSIVE METABOLIC PANEL - Abnormal; Notable for the following components:      Result Value   Glucose, Bld 123 (*)    Total Protein 6.2 (*)    All other components within normal limits  SARS CORONAVIRUS 2 (HOSPITAL ORDER, [facility])  CBC WITH DIFFERENTIAL/PLATELET  BRAIN NATRIURETIC  PEPTIDE    ____________________________________________  EKG: Normal sinus rhythm, ventricular rate 89.  PR 134, QRS 124, QTc 476.  Left bundle branch block noted, no acute change from prior ________________________________________  RADIOLOGY All imaging, including plain films, CT scans, and ultrasounds, independently reviewed by me, and interpretations confirmed via formal radiology reads.  ED MD interpretation:   CXR: no focal abnormality  Official radiology report(s): Dg Chest Port 1 View  Result Date: 09/28/2018 CLINICAL DATA:  Shortness of breath EXAM: PORTABLE CHEST 1 VIEW COMPARISON:  August 26, 2017 FINDINGS: There is a moderate hiatal hernia. Mild atelectasis in the right base. The heart, hila, mediastinum, lungs, and pleura are otherwise normal. IMPRESSION: No acute abnormalities. Electronically Signed   By: Dorise Bullion III M.D   On: 09/28/2018 16:05    ____________________________________________  PROCEDURES   Procedure(s) performed (including Critical Care):  .Critical Care Performed by: Duffy Bruce, MD Authorized by: Duffy Bruce, MD   Critical care provider statement:    Critical care time (minutes):  35   Critical care time was exclusive of:  Separately billable procedures and treating other patients and teaching time   Critical care was necessary to treat or prevent imminent or life-threatening deterioration of the following conditions:  Cardiac failure, circulatory failure and respiratory failure   Critical care was time spent personally by me on the following activities:  Development of treatment plan with patient or surrogate, discussions with consultants, evaluation of patient's response to treatment, examination of patient, obtaining history from patient or surrogate, ordering and performing treatments and interventions, ordering and review of laboratory studies, ordering and review of radiographic studies, pulse oximetry, re-evaluation of  patient's  condition and review of old charts   I assumed direction of critical care for this patient from another provider in my specialty: no      ____________________________________________  INITIAL IMPRESSION / MDM / [place] / ED COURSE  As part of my medical decision making, I reviewed the following data within the electronic MEDICAL RECORD NUMBER Notes from prior ED visits and [place] Controlled Substance Database      *Anglia A Iline Murphy was evaluated in Emergency Department on 09/28/2018 for the symptoms described in the history of present illness. She was evaluated in the context of the global COVID-19 pandemic, which necessitated consideration that the patient might be at risk for infection with the SARS-CoV-2 virus that causes COVID-19. Institutional protocols and algorithms that pertain to the evaluation of patients at risk for COVID-19 are in a state of rapid change based on information released by regulatory bodies including the CDC and federal and state organizations. These policies and algorithms were followed during the patient's care in the ED.  Some ED evaluations and interventions may be delayed as a result of limited staffing during the pandemic.*   Clinical Course as of Sep 27 2048  Wed Sep 27, 1456  5945 63 year old female here with significant shortness of breath.  On exam, he she has diffuse wheezing with diminished aeration, speaking in truncated sentences.  Concern for ongoing asthma exacerbation, less likely pneumonia or CHF.  Labs sent, chest x-ray reviewed and is clear.  Patient started on IV steroids, continuous nebs, and IV mag.   [CI]    Clinical Course User Index [CI] Duffy Bruce, MD    Medical Decision Making: 63 yo F here with acute asthma exacerbation. COVID neg. No focal PNA or PTX on CXR. Feeling better after CAT, steroids, mag but with persistent increased WOB and wheezing. Admit for further management given that she has already been on outpatient steroids and  maximal medical therapy.  ____________________________________________  FINAL CLINICAL IMPRESSION(S) / ED DIAGNOSES  Final diagnoses:  Moderate persistent asthma with exacerbation     MEDICATIONS GIVEN DURING THIS VISIT:  Medications  albuterol (PROVENTIL) (2.5 MG/3ML) 0.083% nebulizer solution 5 mg (has no administration in time range)  albuterol (PROVENTIL) (2.5 MG/3ML) 0.083% nebulizer solution 5 mg (5 mg Nebulization Given 09/28/18 1550)  methylPREDNISolone sodium succinate (SOLU-MEDROL) 125 mg/2 mL injection 125 mg (125 mg Intravenous Given 09/28/18 1642)  magnesium sulfate IVPB 2 g 50 mL (0 g Intravenous Stopped 09/28/18 1744)  sodium chloride 0.9 % bolus 1,000 mL (0 mLs Intravenous Stopped 09/28/18 1837)  albuterol (PROVENTIL,VENTOLIN) solution continuous neb (10 mg/hr Nebulization Given 09/28/18 1646)  ipratropium (ATROVENT) nebulizer solution 1 mg (1 mg Nebulization Given 09/28/18 1646)  prochlorperazine (COMPAZINE) injection 10 mg (10 mg Intravenous Given 09/28/18 1834)  diphenhydrAMINE (BENADRYL) injection 25 mg (25 mg Intravenous Given 09/28/18 1834)     ED Discharge Orders    None       Note:  This document was prepared using Dragon voice recognition software and may include unintentional dictation errors.   Duffy Bruce, MD 09/28/18 2050

## 2018-09-28 NOTE — Progress Notes (Signed)
[facility] [place], [place] [address]  Internal MEDICINE  Office Visit Note  Patient Name: Summer Murphy  [id number]  [id number]  Date of Service: 10/07/2018  Chief Complaint  Patient presents with  . Asthma    been flaring up , been using inhalers and nebulizer ,completed two rounds of antibiotics and two rounds of prednisone   . COPD    Asthma She complains of chest tightness, cough, difficulty breathing, shortness of breath and wheezing. This is a chronic problem. The current episode started more than 1 month ago. The problem occurs 2 to 4 times per day. The problem has been unchanged. The cough is non-productive. Associated symptoms include appetite change and orthopnea. Pertinent negatives include no chest pain. Her symptoms are aggravated by any activity. Her symptoms are alleviated by nothing. She reports no improvement on treatment. Her symptoms are not alleviated by ipratropium, leukotriene antagonist, oral steroids and steroid inhaler. Her past medical history is significant for asthma.    Current Medication: Outpatient Encounter Medications as of 09/28/2018  Medication Sig Note  . ALPRAZolam (XANAX) 0.25 MG tablet Take 0.25 mg by mouth 2 (two) times daily as needed for anxiety.   Marland Kitchen azelastine (ASTELIN) 0.1 % nasal spray once as needed.   . Brexpiprazole (REXULTI) 4 MG TABS Take 4 mg by mouth at bedtime.    . budesonide-formoterol (SYMBICORT) 160-4.5 MCG/ACT inhaler Inhale 2 puffs into the lungs 2 (two) times daily as needed (for respiratory issues.).   Marland Kitchen cyclobenzaprine (FLEXERIL) 10 MG tablet Take 1 tablet (10 mg total) by mouth 2 (two) times daily as needed for muscle spasms. (Patient not taking: Reported on 09/28/2018)   . diclofenac sodium (VOLTAREN) 1 % GEL Apply 2 g topically 2 (two) times daily as needed (for pain).    Marland Kitchen diphenhydrAMINE (BENADRYL) 25 MG tablet Take 50 mg by mouth 2 (two) times daily as needed.    . DULoxetine (CYMBALTA) 30 MG  capsule Take 30 mg by mouth at bedtime. 07/22/2016: TOTAL DOSE=90 MG  . gabapentin (NEURONTIN) 600 MG tablet Take 1 tablet (600 mg total) by mouth 2 (two) times daily.   . hydrOXYzine (ATARAX/VISTARIL) 10 MG tablet Take 1 tablet (10 mg total) by mouth 3 (three) times daily as needed.   Marland Kitchen ipratropium-albuterol (DUONEB) 0.5-2.5 (3) MG/3ML SOLN Take 3 mLs by nebulization every 4 (four) hours as needed (for shortness of breath/wheezing).    . montelukast (SINGULAIR) 10 MG tablet Take 1 tablet (10 mg total) by mouth at bedtime.   Marland Kitchen oxybutynin (DITROPAN) 5 MG tablet TAKE 1 TABLET(5 MG) BY MOUTH TWICE DAILY   . rOPINIRole (REQUIP) 0.5 MG tablet Take 1 tablet (0.5 mg total) by mouth 2 (two) times daily. IF NEEDED FOR RESTLESS LEGS--NORMALLY JUST AT BEDTIME   . SUMAtriptan (IMITREX) 100 MG tablet Take 1 tablet (100 mg total) by mouth every 2 (two) hours as needed (for migraine headaches.). May repeat in 1 hours if headache persists or recurs.   . tamsulosin (FLOMAX) 0.4 MG CAPS capsule Take 0.4 mg by mouth daily.    . valACYclovir (VALTREX) 500 MG tablet Take 500 mg by mouth daily as needed (for fever blisters).   . [DISCONTINUED] amitriptyline (ELAVIL) 50 MG tablet Take 50 mg by mouth daily.   . [DISCONTINUED] COMBIVENT RESPIMAT 20-100 MCG/ACT AERS respimat INHALE 1 PUFF INTO THE LUNGS EVERY 6 HOURS   . [DISCONTINUED] HYDROcodone-acetaminophen (NORCO) 5-325 MG tablet Take 1 tablet by mouth every 4 (four) hours  as needed for moderate pain. (Patient not taking: Reported on 09/28/2018)   . [DISCONTINUED] metoprolol succinate (TOPROL-XL) 25 MG 24 hr tablet TAKE 1 TABLET(25 MG) BY MOUTH DAILY (Patient not taking: Reported on 09/28/2018)   . [DISCONTINUED] omeprazole (PRILOSEC) 40 MG capsule Take 1 capsule (40 mg total) by mouth daily.   . [DISCONTINUED] ondansetron (ZOFRAN) 8 MG tablet Take 1 tablet (8 mg total) by mouth 3 (three) times daily as needed for nausea or vomiting. (Patient not taking: Reported on 09/28/2018)    . [DISCONTINUED] Tiotropium Bromide Monohydrate (SPIRIVA RESPIMAT) 2.5 MCG/ACT AERS Inhale 2.5 mcg into the lungs 2 (two) times daily.   . Ipratropium-Albuterol (COMBIVENT RESPIMAT) 20-100 MCG/ACT AERS respimat Inhale 1 puff into the lungs every 6 (six) hours.   . [DISCONTINUED] azithromycin (ZITHROMAX) 250 MG tablet Take one tab a day for 10 days for uri (Patient not taking: Reported on 09/28/2018)   . [DISCONTINUED] cefUROXime (CEFTIN) 500 MG tablet Take 1 tablet (500 mg total) by mouth 2 (two) times daily with a meal. (Patient not taking: Reported on 09/28/2018)   . [DISCONTINUED] DULoxetine (CYMBALTA) 60 MG capsule Take 1 capsule (60 mg total) by mouth daily for 14 days.   . [DISCONTINUED] hydrocortisone 1 % ointment Apply 1 application topically 2 (two) times daily. (Patient not taking: Reported on 09/28/2018)   . [DISCONTINUED] methylPREDNISolone acetate (DEPO-MEDROL) 80 MG/ML injection Take 1 ml every day for asthma (Patient not taking: Reported on 09/28/2018)   . [DISCONTINUED] predniSONE (STERAPRED UNI-PAK 21 TAB) 10 MG (21) TBPK tablet 6 day taper - take by mouth as directed for 6 days (Patient not taking: Reported on 09/28/2018)   . [DISCONTINUED] traMADol (ULTRAM) 50 MG tablet Take 1 tablet (50 mg total) by mouth every 6 (six) hours as needed (FOR PAIN.). (Patient not taking: Reported on 09/28/2018)   . [DISCONTINUED] traZODone (DESYREL) 150 MG tablet Take 1 tablet (150 mg total) by mouth at bedtime for 14 days.   . [EXPIRED] methylPREDNISolone acetate (DEPO-MEDROL) injection 80 mg     No facility-administered encounter medications on file as of 09/28/2018.     Surgical History: Past Surgical History:  Procedure Laterality Date  . ABDOMINAL HYSTERECTOMY    . AMPUTATION TOE Left 07/31/2016   Procedure: AMPUTATION TOE/MPJ 2nd toe;  Surgeon: Sharlotte Alamo, DPM;  Location: [facility] ORS;  Service: Podiatry;  Laterality: Left;  . APPENDECTOMY  1990  . BACK SURGERY    . BREAST SURGERY     bilateral breast  reduction  . CARDIAC ELECTROPHYSIOLOGY STUDY AND ABLATION    . CHOLECYSTECTOMY  1990  . COLONOSCOPY WITH PROPOFOL N/A 01/04/2017   Procedure: COLONOSCOPY WITH PROPOFOL;  Surgeon: Manya Silvas, MD;  Location: [facility] ENDOSCOPY;  Service: Endoscopy;  Laterality: N/A;  . CORNEAL TRANSPLANT    . ESOPHAGOGASTRODUODENOSCOPY (EGD) WITH PROPOFOL N/A 01/04/2017   Procedure: ESOPHAGOGASTRODUODENOSCOPY (EGD) WITH PROPOFOL;  Surgeon: Manya Silvas, MD;  Location: [facility] ENDOSCOPY;  Service: Endoscopy;  Laterality: N/A;  . EXCISION BONE CYST Left 07/31/2016   Procedure: EXCISION BONE CYST/exostectomy 28124/left 2nd;  Surgeon: Sharlotte Alamo, DPM;  Location: [facility] ORS;  Service: Podiatry;  Laterality: Left;  . EXTRACORPOREAL SHOCK WAVE LITHOTRIPSY Left 09/12/2015   Procedure: EXTRACORPOREAL SHOCK WAVE LITHOTRIPSY (ESWL);  Surgeon: Hollice Espy, MD;  Location: [facility] ORS;  Service: Urology;  Laterality: Left;  . FRACTURE SURGERY     left foot  . HH repair    . JOINT REPLACEMENT Bilateral 2013,2014   total knees  . LAPAROSCOPIC HYSTERECTOMY    .  LITHOTRIPSY    . TONSILLECTOMY    . URETEROSCOPY      Medical History: Past Medical History:  Diagnosis Date  . Acid reflux   . Anxiety   . Arrhythmia    treated with meds and has no current problems  . Arthritis    most uncomfortable in knees  . Asthma    uses several inhalers  . Depression   . Diabetes mellitus without complication (HCC)    sugars run low, not high sugars  . Fever blister   . Hematuria   . Hypertension   . Kidney stone    stones, 2nd lithotripsy  . Left flank pain   . Migraine   . Restless leg   . Yeast vaginitis     Family History: Family History  Problem Relation Age of Onset  . Prostate cancer Father   . Kidney Stones Father   . Kidney disease Neg Hx     Social History   Socioeconomic History  . Marital status: Divorced    Spouse name: Not on file  . Number of children: Not on file  . Years of education: Not  on file  . Highest education level: Not on file  Occupational History  . Not on file  Social Needs  . Financial resource strain: Not on file  . Food insecurity    Worry: Not on file    Inability: Not on file  . Transportation needs    Medical: Not on file    Non-medical: Not on file  Tobacco Use  . Smoking status: Never Smoker  . Smokeless tobacco: Never Used  Substance and Sexual Activity  . Alcohol use: No  . Drug use: No  . Sexual activity: Not on file  Lifestyle  . Physical activity    Days per week: Not on file    Minutes per session: Not on file  . Stress: Not on file  Relationships  . Social Herbalist on phone: Not on file    Gets together: Not on file    Attends religious service: Not on file    Active member of club or organization: Not on file    Attends meetings of clubs or organizations: Not on file    Relationship status: Not on file  . Intimate partner violence    Fear of current or ex partner: Not on file    Emotionally abused: Not on file    Physically abused: Not on file    Forced sexual activity: Not on file  Other Topics Concern  . Not on file  Social History Narrative  . Not on file      Review of Systems  Constitutional: Positive for appetite change and fatigue.  HENT: Negative.   Eyes: Negative.   Respiratory: Positive for cough, shortness of breath and wheezing.   Cardiovascular: Positive for palpitations. Negative for chest pain.  Endocrine: Negative.   Allergic/Immunologic: Positive for environmental allergies.  Hematological: Negative.     Vital Signs: BP 113/78   Pulse 89   Temp 98.1 F (36.7 C)   Resp 16   Ht 5\' 3"  (1.6 m)   Wt 166 lb (75.3 kg)   SpO2 95%   BMI 29.41 kg/m    Physical Exam HENT:     Nose: Nose normal.     Mouth/Throat:     Mouth: Mucous membranes are moist.  Eyes:     Extraocular Movements: Extraocular movements intact.     Pupils:  Pupils are equal, round, and reactive to light.   Cardiovascular:     Rate and Rhythm: Normal rate and regular rhythm.  Pulmonary:     Breath sounds: Wheezing present.     Comments: Decreased air entry bilaterally with increase effort  Neurological:     Mental Status: She is alert.    Assessment/Plan: 1. SOB (shortness of breath) - Spirometry with Graph, this abnormal, pt has been instructed to go to ED  2. Moderate persistent asthma with acute exacerbation - Ipratropium-Albuterol (COMBIVENT RESPIMAT) 20-100 MCG/ACT AERS respimat; Inhale 1 puff into the lungs every 6 (six) hours.  Dispense: 2 g; Refill: 6 - methylPREDNISolone acetate (DEPO-MEDROL) injection 80 mg  General Counseling: Mirah verbalizes understanding of the findings of todays visit and agrees with plan of treatment. I have discussed any further diagnostic evaluation that may be needed or ordered today. We also reviewed her medications today. she has been encouraged to call the office with any questions or concerns that should arise related to todays visit.    Orders Placed This Encounter  Procedures  . Spirometry with Graph    Meds ordered this encounter  Medications  . DISCONTD: methylPREDNISolone acetate (DEPO-MEDROL) 80 MG/ML injection    Sig: Take 1 ml every day for asthma    Dispense:  5 mL    Refill:  1  . Ipratropium-Albuterol (COMBIVENT RESPIMAT) 20-100 MCG/ACT AERS respimat    Sig: Inhale 1 puff into the lungs every 6 (six) hours.    Dispense:  2 g    Refill:  6  . methylPREDNISolone acetate (DEPO-MEDROL) injection 80 mg    Time spent:25 Minutes  Dr Lavera Guise Internal medicine

## 2018-09-28 NOTE — ED Notes (Signed)
This RN attempted to call report at this time.  

## 2018-09-28 NOTE — ED Notes (Signed)
ED TO INPATIENT HANDOFF REPORT  ED Nurse Name and Phone #: Margaux Engen 63  S Name/Age/Gender Summer Murphy 63 y.o. female Room/Bed: ED16A/ED16A  Code Status   Code Status: Prior  Home/SNF/Other Home Patient oriented to: self, place, time and situation Is this baseline? Yes   Triage Complete: Triage complete  Chief Complaint sob  Triage Note Pt c/o increased SOB over the past 2 weeks with asthma, states she has been seeing her PCP but to day they gave her a steroid injection and said if she was still SOB to come to the ED. Pt is tachypneic in triage. sats 97% in triage.    Allergies Allergies  Allergen Reactions  . Librium [Chlordiazepoxide] Shortness Of Breath  . Metoclopramide Hives and Other (See Comments)    hallucinations hallucinations  . Nsaids Other (See Comments)    Rash/flares asthma issues. Other Reaction: Allergy Rash/flares asthma issues.  Zonia Kief Shortness Of Breath  . Aspirin Hives  . Azithromycin Other (See Comments)    Unsure of what the reaction was.  . Buprenorphine Hcl Other (See Comments)    Unsure of what the reaction was.   . Chlordiazepoxide Hcl     Other reaction(s): OTHER  . Iron Nausea And Vomiting    Other reaction(s): Unknown  . Morphine Other (See Comments)    Unsure of reaction  . Morphine And Related Other (See Comments)    Unsure of reaction  . Sulfa Antibiotics     Other reaction(s): Unknown  . Tolmetin     Other reaction(s): Other (See Comments) Other Reaction: Allergy  . Amlodipine Besylate Rash    Rash , itching , arms, stomach and forehead    Level of Care/Admitting Diagnosis ED Disposition    ED Disposition Condition Comment   Admit  The patient appears reasonably stabilized for admission considering the current resources, flow, and capabilities available in the ED at this time, and I doubt any other [facility] requiring further screening and/or treatment in the ED prior to admission is  present.        B Medical/Surgery History Past Medical History:  Diagnosis Date  . Acid reflux   . Anxiety   . Arrhythmia    treated with meds and has no current problems  . Arthritis    most uncomfortable in knees  . Asthma    uses several inhalers  . Depression   . Diabetes mellitus without complication (HCC)    sugars run low, not high sugars  . Fever blister   . Hematuria   . Hypertension   . Kidney stone    stones, 2nd lithotripsy  . Left flank pain   . Migraine   . Restless leg   . Yeast vaginitis    Past Surgical History:  Procedure Laterality Date  . ABDOMINAL HYSTERECTOMY    . AMPUTATION TOE Left 07/31/2016   Procedure: AMPUTATION TOE/MPJ 2nd toe;  Surgeon: Sharlotte Alamo, DPM;  Location: [facility] ORS;  Service: Podiatry;  Laterality: Left;  . APPENDECTOMY  1990  . BACK SURGERY    . BREAST SURGERY     bilateral breast reduction  . CARDIAC ELECTROPHYSIOLOGY STUDY AND ABLATION    . CHOLECYSTECTOMY  1990  . COLONOSCOPY WITH PROPOFOL N/A 01/04/2017   Procedure: COLONOSCOPY WITH PROPOFOL;  Surgeon: Manya Silvas, MD;  Location: [facility] ENDOSCOPY;  Service: Endoscopy;  Laterality: N/A;  . CORNEAL TRANSPLANT    . ESOPHAGOGASTRODUODENOSCOPY (EGD) WITH PROPOFOL N/A 01/04/2017   Procedure: ESOPHAGOGASTRODUODENOSCOPY (EGD) WITH PROPOFOL;  Surgeon: Manya Silvas, MD;  Location: [facility] ENDOSCOPY;  Service: Endoscopy;  Laterality: N/A;  . EXCISION BONE CYST Left 07/31/2016   Procedure: EXCISION BONE CYST/exostectomy 28124/left 2nd;  Surgeon: Sharlotte Alamo, DPM;  Location: [facility] ORS;  Service: Podiatry;  Laterality: Left;  . EXTRACORPOREAL SHOCK WAVE LITHOTRIPSY Left 09/12/2015   Procedure: EXTRACORPOREAL SHOCK WAVE LITHOTRIPSY (ESWL);  Surgeon: Hollice Espy, MD;  Location: [facility] ORS;  Service: Urology;  Laterality: Left;  . FRACTURE SURGERY     left foot  . HH repair    . JOINT REPLACEMENT Bilateral 2013,2014   total knees  . LAPAROSCOPIC HYSTERECTOMY    . LITHOTRIPSY    . TONSILLECTOMY     . URETEROSCOPY       A IV Location/Drains/Wounds Patient Lines/Drains/Airways Status   Active Line/Drains/Airways    Name:   Placement date:   Placement time:   Site:   Days:   Peripheral IV 09/28/18 Right Hand   09/28/18    1552    Hand   less than 1   Incision (Closed) 07/31/16 Foot Left   07/31/16    0844     789          Intake/Output Last 24 hours No intake or output data in the 24 hours ending 09/28/18 1836  Labs/Imaging Results for orders placed or performed during the hospital encounter of 09/28/18 (from the past 48 hour(s))  CBC with Differential     Status: None   Collection Time: 09/28/18  3:47 PM  Result Value Ref Range   WBC 7.6 4.0 - 10.5 K/uL   RBC 4.31 3.87 - 5.11 MIL/uL   Hemoglobin 13.1 12.0 - 15.0 g/dL   HCT 38.8 36.0 - 46.0 %   MCV 90.0 80.0 - 100.0 fL   MCH 30.4 26.0 - 34.0 pg   MCHC 33.8 30.0 - 36.0 g/dL   RDW 12.8 11.5 - 15.5 %   Platelets 227 150 - 400 K/uL   nRBC 0.0 0.0 - 0.2 %   Neutrophils Relative % 57 %   Neutro Abs 4.3 1.7 - 7.7 K/uL   Lymphocytes Relative 30 %   Lymphs Abs 2.3 0.7 - 4.0 K/uL   Monocytes Relative 6 %   Monocytes Absolute 0.5 0.1 - 1.0 K/uL   Eosinophils Relative 6 %   Eosinophils Absolute 0.4 0.0 - 0.5 K/uL   Basophils Relative 1 %   Basophils Absolute 0.1 0.0 - 0.1 K/uL   Immature Granulocytes 0 %   Abs Immature Granulocytes 0.01 0.00 - 0.07 K/uL    Comment: Performed at [facility], [address] [place], [place] [address]  Comprehensive metabolic panel     Status: Abnormal   Collection Time: 09/28/18  3:47 PM  Result Value Ref Range   Sodium 140 135 - 145 mmol/L   Potassium 4.0 3.5 - 5.1 mmol/L   Chloride 106 98 - 111 mmol/L   CO2 23 22 - 32 mmol/L   Glucose, Bld 123 (H) 70 - 99 mg/dL   BUN 18 8 - 23 mg/dL   Creatinine, Ser 0.79 0.44 - 1.00 mg/dL   Calcium 9.1 8.9 - 10.3 mg/dL   Total Protein 6.2 (L) 6.5 - 8.1 g/dL   Albumin 3.8 3.5 - 5.0 g/dL   AST 25 15 - 41 U/L   ALT 28 0 - 44 U/L    Alkaline Phosphatase 88 38 - 126 U/L   Total Bilirubin 0.3 0.3 - 1.2 mg/dL   GFR calc  non Af Amer >60 >60 mL/min   GFR calc Af Amer >60 >60 mL/min   Anion gap 11 5 - 15    Comment: Performed at [facility], [address] [place], [place] [address]  Brain natriuretic peptide     Status: None   Collection Time: 09/28/18  3:57 PM  Result Value Ref Range   B Natriuretic Peptide 45.0 0.0 - 100.0 pg/mL    Comment: Performed at [facility], [address] [place], [place] [address]  SARS Coronavirus 2 (CEPHEID - Performed in [place] hospital lab), Hosp Order     Status: None   Collection Time: 09/28/18  4:56 PM   Specimen: Nasopharyngeal Swab  Result Value Ref Range   SARS Coronavirus 2 NEGATIVE NEGATIVE    Comment: (NOTE) If result is NEGATIVE SARS-CoV-2 target nucleic acids are NOT DETECTED. The SARS-CoV-2 RNA is generally detectable in upper and lower  respiratory specimens during the acute phase of infection. The lowest  concentration of SARS-CoV-2 viral copies this assay can detect is 250  copies / mL. A negative result does not preclude SARS-CoV-2 infection  and should not be used as the sole basis for treatment or other  patient management decisions.  A negative result may occur with  improper specimen collection / handling, submission of specimen other  than nasopharyngeal swab, presence of viral mutation(s) within the  areas targeted by this assay, and inadequate number of viral copies  (<250 copies / mL). A negative result must be combined with clinical  observations, patient history, and epidemiological information. If result is POSITIVE SARS-CoV-2 target nucleic acids are DETECTED. The SARS-CoV-2 RNA is generally detectable in upper and lower  respiratory specimens dur ing the acute phase of infection.  Positive  results are indicative of active infection with SARS-CoV-2.  Clinical  correlation with patient history and other diagnostic information  is  necessary to determine patient infection status.  Positive results do  not rule out bacterial infection or co-infection with other viruses. If result is PRESUMPTIVE POSTIVE SARS-CoV-2 nucleic acids MAY BE PRESENT.   A presumptive positive result was obtained on the submitted specimen  and confirmed on repeat testing.  While 2019 novel coronavirus  (SARS-CoV-2) nucleic acids may be present in the submitted sample  additional confirmatory testing may be necessary for epidemiological  and / or clinical management purposes  to differentiate between  SARS-CoV-2 and other Sarbecovirus currently known to infect humans.  If clinically indicated additional testing with an alternate test  methodology [phone]) is advised. The SARS-CoV-2 RNA is generally  detectable in upper and lower respiratory sp ecimens during the acute  phase of infection. The expected result is Negative. Fact Sheet for Patients:  StrictlyIdeas.no Fact Sheet for Healthcare Providers: BankingDealers.co.za This test is not yet approved or cleared by the [place] FDA and has been authorized for detection and/or diagnosis of SARS-CoV-2 by FDA under an Emergency Use Authorization (EUA).  This EUA will remain in effect (meaning this test can be used) for the duration of the COVID-19 declaration under Section 564(b)(1) of the Act, 21 U.S.C. section 360bbb-3(b)(1), unless the authorization is terminated or revoked sooner. Performed at [facility], [address] [place], [place] [address]    Dg Chest Port 1 View  Result Date: 09/28/2018 CLINICAL DATA:  Shortness of breath EXAM: PORTABLE CHEST 1 VIEW COMPARISON:  Summer 6, 2019 FINDINGS: There is a moderate hiatal hernia. Mild atelectasis in the right base. The heart, hila,  mediastinum, lungs, and pleura are otherwise normal. IMPRESSION: No acute abnormalities. Electronically Signed   By: Dorise Bullion III M.D   On:  09/28/2018 16:05    Pending Labs Unresulted Labs (From admission, onward)   None      Vitals/Pain Today's Vitals   09/28/18 1745 09/28/18 1800 09/28/18 1815 09/28/18 1830  BP:  118/62  128/62  Pulse: 86 87 86 88  Resp: (!) 31 16 17 16   Temp:      TempSrc:      SpO2: 100% 100% 95% 96%  Weight:      Height:      PainSc:        Isolation Precautions No active isolations  Medications Medications  albuterol (PROVENTIL) (2.5 MG/3ML) 0.083% nebulizer solution 5 mg (5 mg Nebulization Given 09/28/18 1550)  methylPREDNISolone sodium succinate (SOLU-MEDROL) 125 mg/2 mL injection 125 mg (125 mg Intravenous Given 09/28/18 1642)  magnesium sulfate IVPB 2 g 50 mL (2 g Intravenous New Bag/Given 09/28/18 1644)  sodium chloride 0.9 % bolus 1,000 mL (1,000 mLs Intravenous New Bag/Given 09/28/18 1642)  albuterol (PROVENTIL,VENTOLIN) solution continuous neb (10 mg/hr Nebulization Given 09/28/18 1646)  ipratropium (ATROVENT) nebulizer solution 1 mg (1 mg Nebulization Given 09/28/18 1646)  prochlorperazine (COMPAZINE) injection 10 mg (10 mg Intravenous Given 09/28/18 1834)  diphenhydrAMINE (BENADRYL) injection 25 mg (25 mg Intravenous Given 09/28/18 1834)    Mobility walks Low fall risk   Focused Assessments Pulmonary Assessment Handoff:  Lung sounds: L Breath Sounds: Diminished R Breath Sounds: Diminished O2 Device: Room Air        R Recommendations: See Admitting Provider Note  Report given to:   Additional Notes:

## 2018-09-28 NOTE — H&P (Addendum)
[place] at [address] NAME: Summer Murphy    MR#:  [id number]  DATE OF BIRTH:  Jul 21, 1955  DATE OF ADMISSION:  09/28/2018  PRIMARY CARE PHYSICIAN: Lavera Guise, MD   REQUESTING/REFERRING PHYSICIAN: Ellender Hose, MD  CHIEF COMPLAINT:   Chief Complaint  Patient presents with  . Shortness of Breath    HISTORY OF PRESENT ILLNESS:  Summer Murphy  is a 63 y.o. female who presents with chief complaint as above.  Patient presents to the ED with complaint of shortness of breath.  She states over the past several weeks she has been having increasing cough and shortness of breath with wheezing.  She was placed onto prednisone tapers.  She received a prednisone injection in the clinic today.  However, her breathing got acutely worse and she came to the ED.  Here she has had some improvement in her breathing after nebs, Solu-Medrol, and IV magnesium.  Coronavirus test was negative.  Hospitalist were called for admission  PAST MEDICAL HISTORY:   Past Medical History:  Diagnosis Date  . Acid reflux   . Anxiety   . Arrhythmia    treated with meds and has no current problems  . Arthritis    most uncomfortable in knees  . Asthma    uses several inhalers  . Depression   . Diabetes mellitus without complication (HCC)    sugars run low, not high sugars  . Fever blister   . Hematuria   . Hypertension   . Kidney stone    stones, 2nd lithotripsy  . Left flank pain   . Migraine   . Restless leg   . Yeast vaginitis      PAST SURGICAL HISTORY:   Past Surgical History:  Procedure Laterality Date  . ABDOMINAL HYSTERECTOMY    . AMPUTATION TOE Left 07/31/2016   Procedure: AMPUTATION TOE/MPJ 2nd toe;  Surgeon: Sharlotte Alamo, DPM;  Location: [facility] ORS;  Service: Podiatry;  Laterality: Left;  . APPENDECTOMY  1990  . BACK SURGERY    . BREAST SURGERY     bilateral breast reduction  . CARDIAC ELECTROPHYSIOLOGY STUDY AND ABLATION    . CHOLECYSTECTOMY   1990  . COLONOSCOPY WITH PROPOFOL N/A 01/04/2017   Procedure: COLONOSCOPY WITH PROPOFOL;  Surgeon: Manya Silvas, MD;  Location: [facility] ENDOSCOPY;  Service: Endoscopy;  Laterality: N/A;  . CORNEAL TRANSPLANT    . ESOPHAGOGASTRODUODENOSCOPY (EGD) WITH PROPOFOL N/A 01/04/2017   Procedure: ESOPHAGOGASTRODUODENOSCOPY (EGD) WITH PROPOFOL;  Surgeon: Manya Silvas, MD;  Location: [facility] ENDOSCOPY;  Service: Endoscopy;  Laterality: N/A;  . EXCISION BONE CYST Left 07/31/2016   Procedure: EXCISION BONE CYST/exostectomy 28124/left 2nd;  Surgeon: Sharlotte Alamo, DPM;  Location: [facility] ORS;  Service: Podiatry;  Laterality: Left;  . EXTRACORPOREAL SHOCK WAVE LITHOTRIPSY Left 09/12/2015   Procedure: EXTRACORPOREAL SHOCK WAVE LITHOTRIPSY (ESWL);  Surgeon: Hollice Espy, MD;  Location: [facility] ORS;  Service: Urology;  Laterality: Left;  . FRACTURE SURGERY     left foot  . HH repair    . JOINT REPLACEMENT Bilateral 2013,2014   total knees  . LAPAROSCOPIC HYSTERECTOMY    . LITHOTRIPSY    . TONSILLECTOMY    . URETEROSCOPY       SOCIAL HISTORY:   Social History   Tobacco Use  . Smoking status: Never Smoker  . Smokeless tobacco: Never Used  Substance Use Topics  . Alcohol use: No     FAMILY HISTORY:   Family History  Problem  Relation Age of Onset  . Prostate cancer Father   . Kidney Stones Father   . Kidney disease Neg Hx      DRUG ALLERGIES:   Allergies  Allergen Reactions  . Librium [Chlordiazepoxide] Shortness Of Breath  . Metoclopramide Hives and Other (See Comments)    hallucinations hallucinations  . Nsaids Other (See Comments)    Rash/flares asthma issues. Other Reaction: Allergy Rash/flares asthma issues.  Zonia Kief Shortness Of Breath  . Aspirin Hives  . Azithromycin Other (See Comments)    Unsure of what the reaction was.  . Buprenorphine Hcl Other (See Comments)    Unsure of what the reaction was.   . Chlordiazepoxide Hcl     Other reaction(s): OTHER  . Iron Nausea  And Vomiting    Other reaction(s): Unknown  . Morphine Other (See Comments)    Unsure of reaction  . Morphine And Related Other (See Comments)    Unsure of reaction  . Sulfa Antibiotics     Other reaction(s): Unknown  . Tolmetin     Other reaction(s): Other (See Comments) Other Reaction: Allergy  . Amlodipine Besylate Rash    Rash , itching , arms, stomach and forehead    MEDICATIONS AT HOME:   Prior to Admission medications   Medication Sig Start Date End Date Taking? Authorizing Provider  ALPRAZolam (XANAX) 0.25 MG tablet Take 0.25 mg by mouth 2 (two) times daily as needed for anxiety.   Yes [provider]  azelastine (ASTELIN) 0.1 % nasal spray once as needed. 08/22/15  Yes [provider]  Brexpiprazole (REXULTI) 4 MG TABS Take 4 mg by mouth at bedtime.    Yes [provider]  budesonide-formoterol (SYMBICORT) 160-4.5 MCG/ACT inhaler Inhale 2 puffs into the lungs 2 (two) times daily as needed (for respiratory issues.). 05/02/18  Yes Scarboro, Audie Clear, NP  busPIRone (BUSPAR) 10 MG tablet Take 10 mg by mouth 3 (three) times daily. 07/13/18  Yes [provider]  diclofenac sodium (VOLTAREN) 1 % GEL Apply 2 g topically 2 (two) times daily as needed (for pain).    Yes [provider]  diltiazem (DILACOR XR) 180 MG 24 hr capsule Take 180 mg by mouth daily. 04/26/12  Yes [provider]  DULoxetine (CYMBALTA) 30 MG capsule Take 30 mg by mouth at bedtime.   Yes [provider]  gabapentin (NEURONTIN) 600 MG tablet Take 1 tablet (600 mg total) by mouth 2 (two) times daily. 08/11/18  Yes Boscia, Heather E, NP  Ipratropium-Albuterol (COMBIVENT RESPIMAT) 20-100 MCG/ACT AERS respimat Inhale 1 puff into the lungs every 6 (six) hours. 09/28/18  Yes Lavera Guise, MD  ipratropium-albuterol (DUONEB) 0.5-2.5 (3) MG/3ML SOLN Take 3 mLs by nebulization every 4 (four) hours as needed (for shortness of breath/wheezing).    Yes [provider]   montelukast (SINGULAIR) 10 MG tablet Take 1 tablet (10 mg total) by mouth at bedtime. 07/19/18  Yes Scarboro, Audie Clear, NP  omeprazole (PRILOSEC) 40 MG capsule Take 1 capsule (40 mg total) by mouth daily. 04/22/18  Yes Boscia, Heather E, NP  oxybutynin (DITROPAN) 5 MG tablet TAKE 1 TABLET(5 MG) BY MOUTH TWICE DAILY 09/08/18  Yes Scarboro, Audie Clear, NP  rOPINIRole (REQUIP) 0.5 MG tablet Take 1 tablet (0.5 mg total) by mouth 2 (two) times daily. IF NEEDED FOR RESTLESS LEGS--NORMALLY JUST AT BEDTIME 09/08/18  Yes Boscia, Heather E, NP  tamsulosin (FLOMAX) 0.4 MG CAPS capsule Take 0.4 mg by mouth daily.  04/26/13  Yes [provider]  traZODone (DESYREL) 50 MG tablet Take 50 mg by mouth at bedtime.   Yes [provider]  valACYclovir (VALTREX) 500 MG tablet Take 500 mg by mouth daily as needed (for fever blisters).   Yes [provider]  cyclobenzaprine (FLEXERIL) 10 MG tablet Take 1 tablet (10 mg total) by mouth 2 (two) times daily as needed for muscle spasms. Patient not taking: Reported on 09/28/2018 04/22/18   Ronnell Freshwater, NP  diphenhydrAMINE (BENADRYL) 25 MG tablet Take 50 mg by mouth 2 (two) times daily as needed.     [provider]  estrogens, conjugated, (PREMARIN) 0.45 MG tablet Take 0.45 mg by mouth daily. 04/26/12   [provider]  hydrOXYzine (ATARAX/VISTARIL) 10 MG tablet Take 1 tablet (10 mg total) by mouth 3 (three) times daily as needed. 05/10/18   Kendell Bane, NP  levocetirizine (XYZAL) 5 MG tablet Take 5 mg by mouth daily. 01/22/14   [provider]  methylPREDNISolone acetate (DEPO-MEDROL) 80 MG/ML injection Take 1 ml every day for asthma Patient not taking: Reported on 09/28/2018 09/28/18   Lavera Guise, MD  metoprolol succinate (TOPROL-XL) 25 MG 24 hr tablet TAKE 1 TABLET(25 MG) BY MOUTH DAILY Patient not taking: Reported on 09/28/2018 05/10/18   Kendell Bane, NP  SUMAtriptan (IMITREX) 100 MG tablet Take 1 tablet (100 mg total) by  mouth every 2 (two) hours as needed (for migraine headaches.). May repeat in 1 hours if headache persists or recurs. 04/22/18   Ronnell Freshwater, NP    REVIEW OF SYSTEMS:  Review of Systems  Constitutional: Negative for chills, fever, malaise/fatigue and weight loss.  HENT: Negative for ear pain, hearing loss and tinnitus.   Eyes: Negative for blurred vision, double vision, pain and redness.  Respiratory: Positive for cough, shortness of breath and wheezing. Negative for hemoptysis.   Cardiovascular: Negative for chest pain, palpitations, orthopnea and leg swelling.  Gastrointestinal: Negative for abdominal pain, constipation, diarrhea, nausea and vomiting.  Genitourinary: Negative for dysuria, frequency and hematuria.  Musculoskeletal: Negative for back pain, joint pain and neck pain.  Skin:       No acne, rash, or lesions  Neurological: Negative for dizziness, tremors, focal weakness and weakness.  Endo/Heme/Allergies: Negative for polydipsia. Does not bruise/bleed easily.  Psychiatric/Behavioral: Negative for depression. The patient is not nervous/anxious and does not have insomnia.      VITAL SIGNS:   Vitals:   09/28/18 1800 09/28/18 1815 09/28/18 1830 09/28/18 1900  BP: 118/62  128/62 123/68  Pulse: 87 86 88 84  Resp: 16 17 16 13   Temp:      TempSrc:      SpO2: 100% 95% 96% 94%  Weight:      Height:       Wt Readings from Last 3 Encounters:  09/28/18 75.3 kg  09/28/18 75.3 kg  09/13/18 77.1 kg    PHYSICAL EXAMINATION:  Physical Exam  Vitals reviewed. Constitutional: She is oriented to person, place, and time. She appears well-developed and well-nourished. No distress.  HENT:  Head: Normocephalic and atraumatic.  Mouth/Throat: Oropharynx is clear and moist.  Eyes: Pupils are equal, round, and reactive to light. Conjunctivae and EOM are normal. No scleral icterus.  Neck: Normal range of motion. Neck supple. No JVD present. No thyromegaly present.  Cardiovascular:  Normal rate, regular rhythm and intact distal pulses. Exam reveals no gallop and no friction rub.  No murmur heard. Respiratory: Effort normal. No respiratory  distress. She has wheezes. She has no rales.  GI: Soft. Bowel sounds are normal. She exhibits no distension. There is no abdominal tenderness.  Musculoskeletal: Normal range of motion.        General: No edema.     Comments: No arthritis, no gout  Lymphadenopathy:    She has no cervical adenopathy.  Neurological: She is alert and oriented to person, place, and time. No cranial nerve deficit.  No dysarthria, no aphasia  Skin: Skin is warm and dry. No rash noted. No erythema.  Psychiatric: She has a normal mood and affect. Her behavior is normal. Judgment and thought content normal.    LABORATORY PANEL:   CBC Recent Labs  Lab 09/28/18 1547  WBC 7.6  HGB 13.1  HCT 38.8  PLT 227   ------------------------------------------------------------------------------------------------------------------  Chemistries  Recent Labs  Lab 09/28/18 1547  NA 140  K 4.0  CL 106  CO2 23  GLUCOSE 123*  BUN 18  CREATININE 0.79  CALCIUM 9.1  AST 25  ALT 28  ALKPHOS 88  BILITOT 0.3   ------------------------------------------------------------------------------------------------------------------  Cardiac Enzymes No results for input(s): TROPONINI in the last 168 hours. ------------------------------------------------------------------------------------------------------------------  RADIOLOGY:  Dg Chest Port 1 View  Result Date: 09/28/2018 CLINICAL DATA:  Shortness of breath EXAM: PORTABLE CHEST 1 VIEW COMPARISON:  August 26, 2017 FINDINGS: There is a moderate hiatal hernia. Mild atelectasis in the right base. The heart, hila, mediastinum, lungs, and pleura are otherwise normal. IMPRESSION: No acute abnormalities. Electronically Signed   By: Dorise Bullion III M.D   On: 09/28/2018 16:05    EKG:   Orders placed or performed during  the hospital encounter of 09/28/18  . ED EKG  . ED EKG    IMPRESSION AND PLAN:  Principal Problem:   COPD with acute exacerbation ([place]) -continue IV Solu-Medrol, duo nebs, add p.o. doxycycline given that she is failed outpatient steroid only treatment and she is allergic to azithromycin, continue home medications and inhalers Active Problems:   Severe recurrent major depression without psychotic features ([place]) -continue home meds   Hypertension -home dose antihypertensives  Chart review performed and case discussed with ED provider. Labs, imaging and/or ECG reviewed by provider and discussed with patient/family. Management plans discussed with the patient and/or family.  COVID-19 status: Tested negative     DVT PROPHYLAXIS: SubQ lovenox   GI PROPHYLAXIS:  PPI   ADMISSION STATUS: Inpatient     CODE STATUS: Full Code Status History    Date Active Date Inactive Code Status Order ID Comments User Context   07/31/2016 0946 07/31/2016 1337 Full Code [id number]  Sharlotte Alamo, [facility] Inpatient   01/20/2016 1109 01/22/2016 2005 Full Code [id number]  Bettey Costa, MD Inpatient   Advance Care Planning Activity      TOTAL TIME TAKING CARE OF THIS PATIENT: 45 minutes.   This patient was evaluated in the context of the global COVID-19 pandemic, which necessitated consideration that the patient might be at risk for infection with the SARS-CoV-2 virus that causes COVID-19. Institutional protocols and algorithms that pertain to the evaluation of patients at risk for COVID-19 are in a state of rapid change based on information released by regulatory bodies including the CDC and federal and state organizations. These policies and algorithms were followed to the best of this provider's knowledge to date during the patient's care at this facility.  Ethlyn Daniels 09/28/2018, 10:15 PM  [facility]  Office  [phone]  CC: Primary care physician; Clayborn Bigness  M, MD  Note:  This document  was prepared using Dragon voice recognition software and may include unintentional dictation errors.

## 2018-09-29 ENCOUNTER — Ambulatory Visit: Payer: Medicare Other | Admitting: Adult Health

## 2018-09-29 LAB — BASIC METABOLIC PANEL
Anion gap: 10 (ref 5–15)
BUN: 17 mg/dL (ref 8–23)
CO2: 23 mmol/L (ref 22–32)
Calcium: 8.6 mg/dL — ABNORMAL LOW (ref 8.9–10.3)
Chloride: 106 mmol/L (ref 98–111)
Creatinine, Ser: 0.69 mg/dL (ref 0.44–1.00)
GFR calc Af Amer: >60 mL/min (ref >60)
GFR calc non Af Amer: >60 mL/min (ref >60)
Glucose, Bld: 217 mg/dL — ABNORMAL HIGH (ref 70–99)
Potassium: 3.8 mmol/L (ref 3.5–5.1)
Sodium: 139 mmol/L (ref 135–145)

## 2018-09-29 LAB — CBC
HCT: 38.8 % (ref 36.0–46.0)
Hemoglobin: 12.6 g/dL (ref 12.0–15.0)
MCH: 30.4 pg (ref 26.0–34.0)
MCHC: 32.5 g/dL (ref 30.0–36.0)
MCV: 93.5 fL (ref 80.0–100.0)
Platelets: 220 10*3/uL (ref 150–400)
RBC: 4.15 MIL/uL (ref 3.87–5.11)
RDW: 13.1 % (ref 11.5–15.5)
WBC: 8.7 10*3/uL (ref 4.0–10.5)
nRBC: 0 % (ref 0.0–0.2)

## 2018-09-29 MED ORDER — ALUM & MAG HYDROXIDE-SIMETH 200-200-20 MG/5ML PO SUSP
15.0000 mL | Freq: Four times a day (QID) | ORAL | Status: DC | PRN
  Administered 2018-09-29: 15 mL via ORAL
  Filled 2018-09-29: qty 30

## 2018-09-29 MED ORDER — PREDNISONE 50 MG PO TABS
50.0000 mg | ORAL_TABLET | Freq: Once | ORAL | Status: AC
  Administered 2018-09-30: 50 mg via ORAL
  Filled 2018-09-29: qty 1

## 2018-09-29 MED ORDER — ESTROGENS CONJUGATED 0.45 MG PO TABS
0.4500 mg | ORAL_TABLET | Freq: Every day | ORAL | Status: DC
  Administered 2018-09-30: 0.45 mg via ORAL
  Filled 2018-09-29: qty 1

## 2018-09-29 MED ORDER — ENOXAPARIN SODIUM 40 MG/0.4ML ~~LOC~~ SOLN
40.0000 mg | SUBCUTANEOUS | Status: DC
  Administered 2018-09-29: 40 mg via SUBCUTANEOUS
  Filled 2018-09-29: qty 0.4

## 2018-09-29 MED ORDER — PREDNISONE 50 MG PO TABS
60.0000 mg | ORAL_TABLET | Freq: Once | ORAL | Status: AC
  Administered 2018-09-29: 60 mg via ORAL
  Filled 2018-09-29: qty 1

## 2018-09-29 MED ORDER — PREDNISOLONE 5 MG PO TABS: 20.0000 mg | ORAL_TABLET | Freq: Once | ORAL | Status: DC

## 2018-09-29 MED ORDER — BUTALBITAL-APAP-CAFFEINE 50-325-40 MG PO TABS
1.0000 | ORAL_TABLET | Freq: Once | ORAL | Status: AC
  Administered 2018-09-29: 1 via ORAL
  Filled 2018-09-29: qty 1

## 2018-09-29 MED ORDER — PREDNISONE 20 MG PO TABS: 40.0000 mg | ORAL_TABLET | Freq: Once | ORAL | Status: DC

## 2018-09-29 MED ORDER — PREDNISOLONE 5 MG PO TABS: 60.0000 mg | ORAL_TABLET | Freq: Once | ORAL | Status: DC

## 2018-09-29 MED ORDER — PREDNISONE 20 MG PO TABS: 30.0000 mg | ORAL_TABLET | Freq: Once | ORAL | Status: DC

## 2018-09-29 MED ORDER — PREDNISONE 50 MG PO TABS: 50.0000 mg | ORAL_TABLET | Freq: Once | ORAL | Status: DC

## 2018-09-29 MED ORDER — PREDNISOLONE 5 MG PO TABS: 10.0000 mg | ORAL_TABLET | Freq: Once | ORAL | Status: DC

## 2018-09-29 MED ORDER — GABAPENTIN 300 MG PO CAPS
600.0000 mg | ORAL_CAPSULE | Freq: Two times a day (BID) | ORAL | Status: DC
  Administered 2018-09-29 – 2018-09-30 (×2): 600 mg via ORAL
  Filled 2018-09-29 (×2): qty 2

## 2018-09-29 NOTE — Progress Notes (Signed)
[place] at [place] NAME: Summer Murphy    MR#:  [id number]  DATE OF BIRTH:  Oct 29, 1955  SUBJECTIVE:  CHIEF COMPLAINT:   Chief Complaint  Patient presents with  . Shortness of Breath   Came with shortness of breath and did not respond to steroid given by outpatient physician. Feels better after receiving IV steroid and doxycycline over here. REVIEW OF SYSTEMS:  CONSTITUTIONAL: No fever, fatigue or weakness.  EYES: No blurred or double vision.  EARS, NOSE, AND THROAT: No tinnitus or ear pain.  RESPIRATORY: No cough, had shortness of breath, wheezing, no hemoptysis.  CARDIOVASCULAR: No chest pain, orthopnea, edema.  GASTROINTESTINAL: No nausea, vomiting, diarrhea or abdominal pain.  GENITOURINARY: No dysuria, hematuria.  ENDOCRINE: No polyuria, nocturia,  HEMATOLOGY: No anemia, easy bruising or bleeding SKIN: No rash or lesion. MUSCULOSKELETAL: No joint pain or arthritis.   NEUROLOGIC: No tingling, numbness, weakness.  PSYCHIATRY: No anxiety or depression.   ROS  DRUG ALLERGIES:   Allergies  Allergen Reactions  . Librium [Chlordiazepoxide] Shortness Of Breath  . Metoclopramide Hives and Other (See Comments)    hallucinations hallucinations  . Nsaids Other (See Comments)    Rash/flares asthma issues. Other Reaction: Allergy Rash/flares asthma issues.  Zonia Kief Shortness Of Breath  . Aspirin Hives  . Azithromycin Other (See Comments)    Unsure of what the reaction was.  . Buprenorphine Hcl Other (See Comments)    Unsure of what the reaction was.   . Chlordiazepoxide Hcl     Other reaction(s): OTHER  . Iron Nausea And Vomiting    Other reaction(s): Unknown  . Morphine Other (See Comments)    Unsure of reaction  . Morphine And Related Other (See Comments)    Unsure of reaction  . Sulfa Antibiotics     Other reaction(s): Unknown  . Tolmetin     Other reaction(s): Other (See Comments) Other Reaction: Allergy  .  Amlodipine Besylate Rash    Rash , itching , arms, stomach and forehead    VITALS:  Blood pressure 110/61, pulse 88, temperature 98.6 F (37 C), temperature source Oral, resp. rate 20, height 5\' 3"  (1.6 m), weight 75.3 kg, SpO2 97 %.  PHYSICAL EXAMINATION:  GENERAL:  63 y.o.-year-old patient lying in the bed with no acute distress.  EYES: Pupils equal, round, reactive to light and accommodation. No scleral icterus. Extraocular muscles intact.  HEENT: Head atraumatic, normocephalic. Oropharynx and nasopharynx clear.  NECK:  Supple, no jugular venous distention. No thyroid enlargement, no tenderness.  LUNGS: Normal breath sounds bilaterally, no wheezing, rales,rhonchi or crepitation. No use of accessory muscles of respiration.  CARDIOVASCULAR: S1, S2 normal. No murmurs, rubs, or gallops.  ABDOMEN: Soft, nontender, nondistended. Bowel sounds present. No organomegaly or mass.  EXTREMITIES: No pedal edema, cyanosis, or clubbing.  NEUROLOGIC: Cranial nerves II through XII are intact. Muscle strength 5/5 in all extremities. Sensation intact. Gait not checked.  PSYCHIATRIC: The patient is alert and oriented x 3.  SKIN: No obvious rash, lesion, or ulcer.   Physical Exam LABORATORY PANEL:   CBC Recent Labs  Lab 09/29/18 0544  WBC 8.7  HGB 12.6  HCT 38.8  PLT 220   ------------------------------------------------------------------------------------------------------------------  Chemistries  Recent Labs  Lab 09/28/18 1547 09/29/18 0544  NA 140 139  K 4.0 3.8  CL 106 106  CO2 23 23  GLUCOSE 123* 217*  BUN 18 17  CREATININE 0.79 0.69  CALCIUM 9.1 8.6*  AST 25  --  ALT 28  --   ALKPHOS 88  --   BILITOT 0.3  --    ------------------------------------------------------------------------------------------------------------------  Cardiac Enzymes No results for input(s): TROPONINI in the last 168  hours. ------------------------------------------------------------------------------------------------------------------  RADIOLOGY:  Dg Chest Port 1 View  Result Date: 09/28/2018 CLINICAL DATA:  Shortness of breath EXAM: PORTABLE CHEST 1 VIEW COMPARISON:  August 26, 2017 FINDINGS: There is a moderate hiatal hernia. Mild atelectasis in the right base. The heart, hila, mediastinum, lungs, and pleura are otherwise normal. IMPRESSION: No acute abnormalities. Electronically Signed   By: Dorise Bullion III M.D   On: 09/28/2018 16:05    ASSESSMENT AND PLAN:   Principal Problem:   COPD with acute exacerbation ([place]) Active Problems:   Severe recurrent major depression without psychotic features ([place])   Hypertension    * COPD with acute exacerbation ([place]) -continue IV Solu-Medrol, duo nebs, added p.o. doxycycline given that she is failed outpatient steroid only treatment and she is allergic to azithromycin, continue home medications and inhalers  *  Severe recurrent major depression without psychotic features ([place]) -continue home meds *  Hypertension -home dose antihypertensives    All the records are reviewed and case discussed with Care Management/Social Workerr. Management plans discussed with the patient, family and they are in agreement.  CODE STATUS: full.  TOTAL TIME TAKING CARE OF THIS PATIENT: 35 minutes.     POSSIBLE D/C IN 1-2 DAYS, DEPENDING ON CLINICAL CONDITION.   Vaughan Basta M.D on 09/29/2018   Between 7am to 6pm - Pager - [phone]  After 6pm go to [email or web address] - password [email or web address] [facility]  Office  [phone]  CC: Primary care physician; Lavera Guise, MD  Note: This dictation was prepared with Dragon dictation along with smaller phrase technology. Any transcriptional errors that result from this process are unintentional.

## 2018-09-29 NOTE — Progress Notes (Signed)
   09/29/18 1300  Clinical Encounter Type  Visited With Patient  Visit Type Initial  Referral From Nurse  Consult/Referral To Chaplain  Spiritual Encounters  Spiritual Needs Other (Comment)  Chaplain received OR for AD. Chaplain visit patient and patient just wanted education on the documentation. Chaplain educated patient and ask where there any questions, patient said "not at this time". Chaplain told patient to have Chaplain page if desired to complete.

## 2018-09-29 NOTE — Plan of Care (Signed)
  Problem: Respiratory: Goal: Levels of oxygenation will improve 09/29/2018 0403 by Tawni Millers, RN Outcome: Not Progressing Continuous pulse ox noted at 88% on room air while patient asleep.  Oxygen at [facility] applied with saturations increased >94%.  Pt without complaints. 09/29/2018 0339 by Tawni Millers, RN Outcome: Progressing

## 2018-09-30 LAB — HIV ANTIBODY (ROUTINE TESTING W REFLEX): HIV Screen 4th Generation wRfx: NONREACTIVE

## 2018-09-30 MED ORDER — PREDNISONE 10 MG (21) PO TBPK: ORAL_TABLET | ORAL | 0 refills | Status: DC

## 2018-09-30 MED ORDER — DOXYCYCLINE HYCLATE 100 MG PO TABS: 100.0000 mg | ORAL_TABLET | Freq: Two times a day (BID) | ORAL | 0 refills | Status: AC

## 2018-09-30 NOTE — Discharge Summary (Signed)
[facility] NAME: Summer Murphy    MR#:  [id number]  DATE OF BIRTH:  27-Sep-1955  DATE OF ADMISSION:  09/28/2018 ADMITTING PHYSICIAN: Lance Coon, MD  DATE OF DISCHARGE: 09/30/2018   PRIMARY CARE PHYSICIAN: Lavera Guise, MD    ADMISSION DIAGNOSIS:  SOB (shortness of breath) [R06.02] Moderate persistent asthma with exacerbation [J45.41]  DISCHARGE DIAGNOSIS:  Principal Problem:   COPD with acute exacerbation (HCC) Active Problems:   Severe recurrent major depression without psychotic features ([place])   Hypertension   SECONDARY DIAGNOSIS:   Past Medical History:  Diagnosis Date  . Acid reflux   . Anxiety   . Arrhythmia    treated with meds and has no current problems  . Arthritis    most uncomfortable in knees  . Asthma    uses several inhalers  . Depression   . Diabetes mellitus without complication (HCC)    sugars run low, not high sugars  . Fever blister   . Hematuria   . Hypertension   . Kidney stone    stones, 2nd lithotripsy  . Left flank pain   . Migraine   . Restless leg   . Yeast vaginitis     HOSPITAL COURSE:   * COPD with acute exacerbation (HCC) -continue IV Solu-Medrol, duo nebs, added p.o.doxycycline given that she is failed outpatient steroid only treatment and she is allergic to azithromycin, continue home medications and inhalers She is much improved with therapy here and we are discharging home with tapering oral steroid and doxycycline.  *Severe recurrent major depression without psychotic features ([place]) -continue home meds *Hypertension -home dose antihypertensives    DISCHARGE CONDITIONS:   Stable  CONSULTS OBTAINED:    DRUG ALLERGIES:   Allergies  Allergen Reactions  . Librium [Chlordiazepoxide] Shortness Of Breath  . Metoclopramide Hives and Other (See Comments)    hallucinations hallucinations  . Nsaids Other (See Comments)    Rash/flares asthma  issues. Other Reaction: Allergy Rash/flares asthma issues.  Zonia Kief Shortness Of Breath  . Aspirin Hives  . Azithromycin Other (See Comments)    Unsure of what the reaction was.  . Buprenorphine Hcl Other (See Comments)    Unsure of what the reaction was.   . Chlordiazepoxide Hcl     Other reaction(s): OTHER  . Iron Nausea And Vomiting    Other reaction(s): Unknown  . Morphine Other (See Comments)    Unsure of reaction  . Morphine And Related Other (See Comments)    Unsure of reaction  . Sulfa Antibiotics     Other reaction(s): Unknown  . Tolmetin     Other reaction(s): Other (See Comments) Other Reaction: Allergy  . Amlodipine Besylate Rash    Rash , itching , arms, stomach and forehead    DISCHARGE MEDICATIONS:   Allergies as of 09/30/2018      Reactions   Librium [chlordiazepoxide] Shortness Of Breath   Metoclopramide Hives, Other (See Comments)   hallucinations hallucinations   Nsaids Other (See Comments)   Rash/flares asthma issues. Other Reaction: Allergy Rash/flares asthma issues.   Vanilla Shortness Of Breath   Aspirin Hives   Azithromycin Other (See Comments)   Unsure of what the reaction was.   Buprenorphine Hcl Other (See Comments)   Unsure of what the reaction was.   Chlordiazepoxide Hcl    Other reaction(s): OTHER   Iron Nausea And Vomiting   Other reaction(s): Unknown   Morphine Other (See Comments)  Unsure of reaction   Morphine And Related Other (See Comments)   Unsure of reaction   Sulfa Antibiotics    Other reaction(s): Unknown   Tolmetin    Other reaction(s): Other (See Comments) Other Reaction: Allergy   Amlodipine Besylate Rash   Rash , itching , arms, stomach and forehead      Medication List    STOP taking these medications   methylPREDNISolone acetate 80 MG/ML injection Commonly known as: DEPO-MEDROL   metoprolol succinate 25 MG 24 hr tablet Commonly known as: TOPROL-XL     TAKE these medications   ALPRAZolam 0.25 MG  tablet Commonly known as: XANAX Take 0.25 mg by mouth 2 (two) times daily as needed for anxiety.   azelastine 0.1 % nasal spray Commonly known as: ASTELIN once as needed.   budesonide-formoterol 160-4.5 MCG/ACT inhaler Commonly known as: SYMBICORT Inhale 2 puffs into the lungs 2 (two) times daily as needed (for respiratory issues.).   busPIRone 10 MG tablet Commonly known as: BUSPAR Take 10 mg by mouth 3 (three) times daily.   cyclobenzaprine 10 MG tablet Commonly known as: FLEXERIL Take 1 tablet (10 mg total) by mouth 2 (two) times daily as needed for muscle spasms.   diclofenac sodium 1 % Gel Commonly known as: VOLTAREN Apply 2 g topically 2 (two) times daily as needed (for pain).   diltiazem 180 MG 24 hr capsule Commonly known as: DILACOR XR Take 180 mg by mouth daily.   diphenhydrAMINE 25 MG tablet Commonly known as: BENADRYL Take 50 mg by mouth 2 (two) times daily as needed.   doxycycline 100 MG tablet Commonly known as: VIBRA-TABS Take 1 tablet (100 mg total) by mouth every 12 (twelve) hours for 3 days.   DULoxetine 30 MG capsule Commonly known as: CYMBALTA Take 30 mg by mouth at bedtime.   estrogens (conjugated) 0.45 MG tablet Commonly known as: PREMARIN Take 0.45 mg by mouth daily.   gabapentin 600 MG tablet Commonly known as: NEURONTIN Take 1 tablet (600 mg total) by mouth 2 (two) times daily.   hydrOXYzine 10 MG tablet Commonly known as: ATARAX/VISTARIL Take 1 tablet (10 mg total) by mouth 3 (three) times daily as needed.   ipratropium-albuterol 0.5-2.5 (3) MG/3ML Soln Commonly known as: DUONEB Take 3 mLs by nebulization every 4 (four) hours as needed (for shortness of breath/wheezing).   Combivent Respimat 20-100 MCG/ACT Aers respimat Generic drug: Ipratropium-Albuterol Inhale 1 puff into the lungs every 6 (six) hours.   levocetirizine 5 MG tablet Commonly known as: XYZAL Take 5 mg by mouth daily.   montelukast 10 MG tablet Commonly known  as: SINGULAIR Take 1 tablet (10 mg total) by mouth at bedtime.   omeprazole 40 MG capsule Commonly known as: PRILOSEC Take 1 capsule (40 mg total) by mouth daily.   oxybutynin 5 MG tablet Commonly known as: DITROPAN TAKE 1 TABLET(5 MG) BY MOUTH TWICE DAILY   predniSONE 10 MG (21) Tbpk tablet Commonly known as: STERAPRED UNI-PAK 21 TAB Take 6 tabs first day, 5 tab on day 2, then 4 on day 3rd, 3 tabs on day 4th , 2 tab on day 5th, and 1 tab on 6th day.   Rexulti 4 MG Tabs Generic drug: Brexpiprazole Take 4 mg by mouth at bedtime.   rOPINIRole 0.5 MG tablet Commonly known as: REQUIP Take 1 tablet (0.5 mg total) by mouth 2 (two) times daily. IF NEEDED FOR RESTLESS LEGS--NORMALLY JUST AT BEDTIME   SUMAtriptan 100 MG tablet Commonly known as:  IMITREX Take 1 tablet (100 mg total) by mouth every 2 (two) hours as needed (for migraine headaches.). May repeat in 1 hours if headache persists or recurs.   tamsulosin 0.4 MG Caps capsule Commonly known as: FLOMAX Take 0.4 mg by mouth daily.   traZODone 50 MG tablet Commonly known as: DESYREL Take 50 mg by mouth at bedtime.   valACYclovir 500 MG tablet Commonly known as: VALTREX Take 500 mg by mouth daily as needed (for fever blisters).        DISCHARGE INSTRUCTIONS:    Follow with primary care physician in 1 week.  If you experience worsening of your admission symptoms, develop shortness of breath, life threatening emergency, suicidal or homicidal thoughts you must seek medical attention immediately by calling 911 or calling your MD immediately  if symptoms less severe.  You Must read complete instructions/literature along with all the possible adverse reactions/side effects for all the Medicines you take and that have been prescribed to you. Take any new Medicines after you have completely understood and accept all the possible adverse reactions/side effects.   Please note  You were cared for by a hospitalist during your  hospital stay. If you have any questions about your discharge medications or the care you received while you were in the hospital after you are discharged, you can call the unit and asked to speak with the hospitalist on call if the hospitalist that took care of you is not available. Once you are discharged, your primary care physician will handle any further medical issues. Please note that NO REFILLS for any discharge medications will be authorized once you are discharged, as it is imperative that you return to your primary care physician (or establish a relationship with a primary care physician if you do not have one) for your aftercare needs so that they can reassess your need for medications and monitor your lab values.    Today   CHIEF COMPLAINT:   Chief Complaint  Patient presents with  . Shortness of Breath    HISTORY OF PRESENT ILLNESS:  Summer Murphy  is a 63 y.o. female presents with chief complaint as above.  Patient presents to the ED with complaint of shortness of breath.  She states over the past several weeks she has been having increasing cough and shortness of breath with wheezing.  She was placed onto prednisone tapers.  She received a prednisone injection in the clinic today.  However, her breathing got acutely worse and she came to the ED.  Here she has had some improvement in her breathing after nebs, Solu-Medrol, and IV magnesium.  Coronavirus test was negative.  Hospitalist were called for admission    VITAL SIGNS:  Blood pressure (!) 107/56, pulse 75, temperature 97.6 F (36.4 C), temperature source Oral, resp. rate 18, height 5\' 3"  (1.6 m), weight 75.3 kg, SpO2 98 %.  I/O:    Intake/Output Summary (Last 24 hours) at 09/30/2018 1427 Last data filed at 09/30/2018 1006 Gross per 24 hour  Intake 840 ml  Output 1200 ml  Net -360 ml    PHYSICAL EXAMINATION:  GENERAL:  63 y.o.-year-old patient lying in the bed with no acute distress.  EYES: Pupils equal, round,  reactive to light and accommodation. No scleral icterus. Extraocular muscles intact.  HEENT: Head atraumatic, normocephalic. Oropharynx and nasopharynx clear.  NECK:  Supple, no jugular venous distention. No thyroid enlargement, no tenderness.  LUNGS: Normal breath sounds bilaterally, no wheezing, rales,rhonchi or crepitation. No use of  accessory muscles of respiration.  CARDIOVASCULAR: S1, S2 normal. No murmurs, rubs, or gallops.  ABDOMEN: Soft, non-tender, non-distended. Bowel sounds present. No organomegaly or mass.  EXTREMITIES: No pedal edema, cyanosis, or clubbing.  NEUROLOGIC: Cranial nerves II through XII are intact. Muscle strength 5/5 in all extremities. Sensation intact. Gait not checked.  PSYCHIATRIC: The patient is alert and oriented x 3.  SKIN: No obvious rash, lesion, or ulcer.   DATA REVIEW:   CBC Recent Labs  Lab 09/29/18 0544  WBC 8.7  HGB 12.6  HCT 38.8  PLT 220    Chemistries  Recent Labs  Lab 09/28/18 1547 09/29/18 0544  NA 140 139  K 4.0 3.8  CL 106 106  CO2 23 23  GLUCOSE 123* 217*  BUN 18 17  CREATININE 0.79 0.69  CALCIUM 9.1 8.6*  AST 25  --   ALT 28  --   ALKPHOS 88  --   BILITOT 0.3  --     Cardiac Enzymes No results for input(s): TROPONINI in the last 168 hours.  Microbiology Results  Results for orders placed or performed during the hospital encounter of 09/28/18  SARS Coronavirus 2 (CEPHEID - Performed in [facility] hospital lab), Hosp Order     Status: None   Collection Time: 09/28/18  4:56 PM   Specimen: Nasopharyngeal Swab  Result Value Ref Range Status   SARS Coronavirus 2 NEGATIVE NEGATIVE Final    Comment: (NOTE) If result is NEGATIVE SARS-CoV-2 target nucleic acids are NOT DETECTED. The SARS-CoV-2 RNA is generally detectable in upper and lower  respiratory specimens during the acute phase of infection. The lowest  concentration of SARS-CoV-2 viral copies this assay can detect is 250  copies / mL. A negative result does  not preclude SARS-CoV-2 infection  and should not be used as the sole basis for treatment or other  patient management decisions.  A negative result may occur with  improper specimen collection / handling, submission of specimen other  than nasopharyngeal swab, presence of viral mutation(s) within the  areas targeted by this assay, and inadequate number of viral copies  (<250 copies / mL). A negative result must be combined with clinical  observations, patient history, and epidemiological information. If result is POSITIVE SARS-CoV-2 target nucleic acids are DETECTED. The SARS-CoV-2 RNA is generally detectable in upper and lower  respiratory specimens dur ing the acute phase of infection.  Positive  results are indicative of active infection with SARS-CoV-2.  Clinical  correlation with patient history and other diagnostic information is  necessary to determine patient infection status.  Positive results do  not rule out bacterial infection or co-infection with other viruses. If result is PRESUMPTIVE POSTIVE SARS-CoV-2 nucleic acids MAY BE PRESENT.   A presumptive positive result was obtained on the submitted specimen  and confirmed on repeat testing.  While 2019 novel coronavirus  (SARS-CoV-2) nucleic acids may be present in the submitted sample  additional confirmatory testing may be necessary for epidemiological  and / or clinical management purposes  to differentiate between  SARS-CoV-2 and other Sarbecovirus currently known to infect humans.  If clinically indicated additional testing with an alternate test  methodology [phone]) is advised. The SARS-CoV-2 RNA is generally  detectable in upper and lower respiratory sp ecimens during the acute  phase of infection. The expected result is Negative. Fact Sheet for Patients:  StrictlyIdeas.no Fact Sheet for Healthcare Providers: BankingDealers.co.za This test is not yet approved or  cleared by the [place] FDA  and has been authorized for detection and/or diagnosis of SARS-CoV-2 by FDA under an Emergency Use Authorization (EUA).  This EUA will remain in effect (meaning this test can be used) for the duration of the COVID-19 declaration under Section 564(b)(1) of the Act, 21 U.S.C. section 360bbb-3(b)(1), unless the authorization is terminated or revoked sooner. Performed at [facility], [address] [place], [place] [address]     RADIOLOGY:  Dg Chest Port 1 View  Result Date: 09/28/2018 CLINICAL DATA:  Shortness of breath EXAM: PORTABLE CHEST 1 VIEW COMPARISON:  August 26, 2017 FINDINGS: There is a moderate hiatal hernia. Mild atelectasis in the right base. The heart, hila, mediastinum, lungs, and pleura are otherwise normal. IMPRESSION: No acute abnormalities. Electronically Signed   By: Dorise Bullion III M.D   On: 09/28/2018 16:05    EKG:   Orders placed or performed during the hospital encounter of 09/28/18  . ED EKG  . ED EKG      Management plans discussed with the patient, family and they are in agreement.  CODE STATUS:     Code Status Orders  (From admission, onward)         Start     Ordered   09/28/18 2330  Full code  Continuous     09/28/18 2329        Code Status History    Date Active Date Inactive Code Status Order ID Comments User Context   07/31/2016 0946 07/31/2016 1337 Full Code [id number]  Sharlotte Alamo, [facility] Inpatient   01/20/2016 1109 01/22/2016 2005 Full Code [id number]  Bettey Costa, MD Inpatient   Advance Care Planning Activity      TOTAL TIME TAKING CARE OF THIS PATIENT: 35 minutes.    Vaughan Basta M.D on 09/30/2018 at 2:27 PM  Between 7am to 6pm - Pager - [phone]  After 6pm go to [email or web address] - password [email or web address] [facility]  Office  [phone]  CC: Primary care physician; Lavera Guise, MD   Note: This dictation was prepared with Dragon dictation along with  smaller phrase technology. Any transcriptional errors that result from this process are unintentional.

## 2018-09-30 NOTE — Care Management Important Message (Signed)
Important Message  Patient Details  Name: Summer Murphy MRN: [id number] Date of Birth: [birth year]   Medicare Important Message Given:  Yes     [name] 09/30/2018, 10:54 AM

## 2018-10-03 ENCOUNTER — Other Ambulatory Visit: Payer: Self-pay

## 2018-10-03 DIAGNOSIS — K2270 Barrett's esophagus without dysplasia: Secondary | ICD-10-CM

## 2018-10-03 MED ORDER — OMEPRAZOLE 40 MG PO CPDR: 40.0000 mg | DELAYED_RELEASE_CAPSULE | Freq: Every day | ORAL | 3 refills | Status: DC

## 2018-10-04 ENCOUNTER — Ambulatory Visit: Payer: Medicare Other | Admitting: Adult Health

## 2018-10-07 NOTE — Patient Instructions (Signed)
Asthma, Adult ° °Asthma is a long-term (chronic) condition in which the airways get tight and narrow. The airways are the breathing passages that lead from the nose and mouth down into the lungs. A person with asthma will have times when symptoms get worse. These are called asthma attacks. They can cause coughing, whistling sounds when you breathe (wheezing), shortness of breath, and chest pain. They can make it hard to breathe. There is no cure for asthma, but medicines and lifestyle changes can help control it. °There are many things that can bring on an asthma attack or make asthma symptoms worse (triggers). Common triggers include: °· Mold. °· Dust. °· Cigarette smoke. °· Cockroaches. °· Things that can cause allergy symptoms (allergens). These include animal skin flakes (dander) and pollen from trees or grass. °· Things that pollute the air. These may include household cleaners, wood smoke, smog, or chemical odors. °· Cold air, weather changes, and wind. °· Crying or laughing hard. °· Stress. °· Certain medicines or drugs. °· Certain foods such as dried fruit, potato chips, and grape juice. °· Infections, such as a cold or the flu. °· Certain medical conditions or diseases. °· Exercise or tiring activities. °Asthma may be treated with medicines and by staying away from the things that cause asthma attacks. Types of medicines may include: °· Controller medicines. These help prevent asthma symptoms. They are usually taken every day. °· Fast-acting reliever or rescue medicines. These quickly relieve asthma symptoms. They are used as needed and provide short-term relief. °· Allergy medicines if your attacks are brought on by allergens. °· Medicines to help control the body's defense (immune) system. °Follow these instructions at home: °Avoiding triggers in your home °· Change your heating and air conditioning filter often. °· Limit your use of fireplaces and wood stoves. °· Get rid of pests (such as roaches and  mice) and their droppings. °· Throw away plants if you see mold on them. °· Clean your floors. Dust regularly. Use cleaning products that do not smell. °· Have someone vacuum when you are not home. Use a vacuum cleaner with a HEPA filter if possible. °· Replace carpet with wood, tile, or vinyl flooring. Carpet can trap animal skin flakes and dust. °· Use allergy-proof pillows, mattress covers, and box spring covers. °· Wash bed sheets and blankets every week in hot water. Dry them in a dryer. °· Keep your bedroom free of any triggers. °· Avoid pets and keep windows closed when things that cause allergy symptoms are in the air. °· Use blankets that are made of polyester or cotton. °· Clean bathrooms and kitchens with bleach. If possible, have someone repaint the walls in these rooms with mold-resistant paint. Keep out of the rooms that are being cleaned and painted. °· Wash your hands often with soap and water. If soap and water are not available, use hand sanitizer. °· Do not allow anyone to smoke in your home. °General instructions °· Take over-the-counter and prescription medicines only as told by your doctor. °? Talk with your doctor if you have questions about how or when to take your medicines. °? Make note if you need to use your medicines more often than usual. °· Do not use any products that contain nicotine or tobacco, such as cigarettes and e-cigarettes. If you need help quitting, ask your doctor. °· Stay away from secondhand smoke. °· Avoid doing things outdoors when allergen counts are high and when air quality is low. °· Wear a ski mask   when doing outdoor activities in the winter. The mask should cover your nose and mouth. Exercise indoors on cold days if you can. °· Warm up before you exercise. Take time to cool down after exercise. °· Use a peak flow meter as told by your doctor. A peak flow meter is a tool that measures how well the lungs are working. °· Keep track of the peak flow meter's readings.  Write them down. °· Follow your asthma action plan. This is a written plan for taking care of your asthma and treating your attacks. °· Make sure you get all the shots (vaccines) that your doctor recommends. Ask your doctor about a flu shot and a pneumonia shot. °· Keep all follow-up visits as told by your doctor. This is important. °Contact a doctor if: °· You have wheezing, shortness of breath, or a cough even while taking medicine to prevent attacks. °· The mucus you cough up (sputum) is thicker than usual. °· The mucus you cough up changes from clear or white to yellow, green, gray, or bloody. °· You have problems from the medicine you are taking, such as: °? A rash. °? Itching. °? Swelling. °? Trouble breathing. °· You need reliever medicines more than 2-3 times a week. °· Your peak flow reading is still at 50-79% of your personal best after following the action plan for 1 hour. °· You have a fever. °Get help right away if: °· You seem to be worse and are not responding to medicine during an asthma attack. °· You are short of breath even at rest. °· You get short of breath when doing very little activity. °· You have trouble eating, drinking, or talking. °· You have chest pain or tightness. °· You have a fast heartbeat. °· Your lips or fingernails start to turn blue. °· You are light-headed or dizzy, or you faint. °· Your peak flow is less than 50% of your personal best. °· You feel too tired to breathe normally. °Summary °· Asthma is a long-term (chronic) condition in which the airways get tight and narrow. An asthma attack can make it hard to breathe. °· Asthma cannot be cured, but medicines and lifestyle changes can help control it. °· Make sure you understand how to avoid triggers and how and when to use your medicines. °This information is not intended to replace advice given to you by your health care provider. Make sure you discuss any questions you have with your health care provider. °Document  Released: 08/26/2007 Document Revised: 05/12/2018 Document Reviewed: 04/13/2016 °Elsevier Patient Education © 2020 [employer] ° °

## 2018-10-10 ENCOUNTER — Ambulatory Visit (INDEPENDENT_AMBULATORY_CARE_PROVIDER_SITE_OTHER): Payer: Medicare Other | Admitting: Adult Health

## 2018-10-10 ENCOUNTER — Encounter: Payer: Self-pay | Admitting: Adult Health

## 2018-10-10 ENCOUNTER — Other Ambulatory Visit: Payer: Self-pay

## 2018-10-10 VITALS — BP 116/86 | HR 107 | Resp 16 | Ht 63.0 in | Wt 165.0 lb

## 2018-10-10 DIAGNOSIS — J069 Acute upper respiratory infection, unspecified: Secondary | ICD-10-CM | POA: Diagnosis not present

## 2018-10-10 DIAGNOSIS — I10 Essential (primary) hypertension: Secondary | ICD-10-CM | POA: Diagnosis not present

## 2018-10-10 DIAGNOSIS — J449 Chronic obstructive pulmonary disease, unspecified: Secondary | ICD-10-CM | POA: Diagnosis not present

## 2018-10-10 NOTE — Progress Notes (Signed)
[facility] [place], [place] [address]  Internal MEDICINE  Office Visit Note  Patient Name: Summer Murphy  [id number]  [id number]  Date of Service: 10/10/2018     Chief Complaint  Patient presents with  . Hospitalization Follow-up    copd , doing much better      HPI Pt is here for recent hospital follow up.  Pt was seen in our office on 7/8 by DFK, and was given a deopmedrol shot. She was told to go to the ER in 2-3 hours if she was not better.  She was not better, and she went to the ER.  Was admitted for 1 night and given steroids and antibiotics.  She is doing much better.  She was discharged on doxycycline and a prednisone taper.  She has completed both, and is feeling much better.   Current Medication: Outpatient Encounter Medications as of 10/10/2018  Medication Sig Note  . ALPRAZolam (XANAX) 0.25 MG tablet Take 0.25 mg by mouth 2 (two) times daily as needed for anxiety.   Marland Kitchen azelastine (ASTELIN) 0.1 % nasal spray once as needed.   . Brexpiprazole (REXULTI) 4 MG TABS Take 4 mg by mouth at bedtime.    . budesonide-formoterol (SYMBICORT) 160-4.5 MCG/ACT inhaler Inhale 2 puffs into the lungs 2 (two) times daily as needed (for respiratory issues.).   Marland Kitchen busPIRone (BUSPAR) 10 MG tablet Take 10 mg by mouth 3 (three) times daily.   . cyclobenzaprine (FLEXERIL) 10 MG tablet Take 1 tablet (10 mg total) by mouth 2 (two) times daily as needed for muscle spasms. (Patient not taking: Reported on 09/28/2018)   . diclofenac sodium (VOLTAREN) 1 % GEL Apply 2 g topically 2 (two) times daily as needed (for pain).    Marland Kitchen diltiazem (DILACOR XR) 180 MG 24 hr capsule Take 180 mg by mouth daily.   . diphenhydrAMINE (BENADRYL) 25 MG tablet Take 50 mg by mouth 2 (two) times daily as needed.    . DULoxetine (CYMBALTA) 30 MG capsule Take 30 mg by mouth at bedtime. 07/22/2016: TOTAL DOSE=90 MG  . estrogens, conjugated, (PREMARIN) 0.45 MG tablet Take 0.45 mg by mouth daily.   Marland Kitchen  gabapentin (NEURONTIN) 600 MG tablet Take 1 tablet (600 mg total) by mouth 2 (two) times daily.   . hydrOXYzine (ATARAX/VISTARIL) 10 MG tablet Take 1 tablet (10 mg total) by mouth 3 (three) times daily as needed.   . Ipratropium-Albuterol (COMBIVENT RESPIMAT) 20-100 MCG/ACT AERS respimat Inhale 1 puff into the lungs every 6 (six) hours.   Marland Kitchen ipratropium-albuterol (DUONEB) 0.5-2.5 (3) MG/3ML SOLN Take 3 mLs by nebulization every 4 (four) hours as needed (for shortness of breath/wheezing).    Marland Kitchen levocetirizine (XYZAL) 5 MG tablet Take 5 mg by mouth daily.   . montelukast (SINGULAIR) 10 MG tablet Take 1 tablet (10 mg total) by mouth at bedtime.   Marland Kitchen omeprazole (PRILOSEC) 40 MG capsule Take 1 capsule (40 mg total) by mouth daily.   Marland Kitchen oxybutynin (DITROPAN) 5 MG tablet TAKE 1 TABLET(5 MG) BY MOUTH TWICE DAILY   . rOPINIRole (REQUIP) 0.5 MG tablet Take 1 tablet (0.5 mg total) by mouth 2 (two) times daily. IF NEEDED FOR RESTLESS LEGS--NORMALLY JUST AT BEDTIME   . SUMAtriptan (IMITREX) 100 MG tablet Take 1 tablet (100 mg total) by mouth every 2 (two) hours as needed (for migraine headaches.). May repeat in 1 hours if headache persists or recurs.   . tamsulosin (FLOMAX) 0.4 MG CAPS capsule Take  0.4 mg by mouth daily.    . traZODone (DESYREL) 50 MG tablet Take 50 mg by mouth at bedtime.   . valACYclovir (VALTREX) 500 MG tablet Take 500 mg by mouth daily as needed (for fever blisters).   . [DISCONTINUED] predniSONE (STERAPRED UNI-PAK 21 TAB) 10 MG (21) TBPK tablet Take 6 tabs first day, 5 tab on day 2, then 4 on day 3rd, 3 tabs on day 4th , 2 tab on day 5th, and 1 tab on 6th day. (Patient not taking: Reported on 10/10/2018)    No facility-administered encounter medications on file as of 10/10/2018.     Surgical History: Past Surgical History:  Procedure Laterality Date  . ABDOMINAL HYSTERECTOMY    . AMPUTATION TOE Left 07/31/2016   Procedure: AMPUTATION TOE/MPJ 2nd toe;  Surgeon: Sharlotte Alamo, DPM;  Location:  [facility] ORS;  Service: Podiatry;  Laterality: Left;  . APPENDECTOMY  1990  . BACK SURGERY    . BREAST SURGERY     bilateral breast reduction  . CARDIAC ELECTROPHYSIOLOGY STUDY AND ABLATION    . CHOLECYSTECTOMY  1990  . COLONOSCOPY WITH PROPOFOL N/A 01/04/2017   Procedure: COLONOSCOPY WITH PROPOFOL;  Surgeon: Manya Silvas, MD;  Location: [facility] ENDOSCOPY;  Service: Endoscopy;  Laterality: N/A;  . CORNEAL TRANSPLANT    . ESOPHAGOGASTRODUODENOSCOPY (EGD) WITH PROPOFOL N/A 01/04/2017   Procedure: ESOPHAGOGASTRODUODENOSCOPY (EGD) WITH PROPOFOL;  Surgeon: Manya Silvas, MD;  Location: [facility] ENDOSCOPY;  Service: Endoscopy;  Laterality: N/A;  . EXCISION BONE CYST Left 07/31/2016   Procedure: EXCISION BONE CYST/exostectomy 28124/left 2nd;  Surgeon: Sharlotte Alamo, DPM;  Location: [facility] ORS;  Service: Podiatry;  Laterality: Left;  . EXTRACORPOREAL SHOCK WAVE LITHOTRIPSY Left 09/12/2015   Procedure: EXTRACORPOREAL SHOCK WAVE LITHOTRIPSY (ESWL);  Surgeon: Hollice Espy, MD;  Location: [facility] ORS;  Service: Urology;  Laterality: Left;  . FRACTURE SURGERY     left foot  . HH repair    . JOINT REPLACEMENT Bilateral 2013,2014   total knees  . LAPAROSCOPIC HYSTERECTOMY    . LITHOTRIPSY    . TONSILLECTOMY    . URETEROSCOPY      Medical History: Past Medical History:  Diagnosis Date  . Acid reflux   . Anxiety   . Arrhythmia    treated with meds and has no current problems  . Arthritis    most uncomfortable in knees  . Asthma    uses several inhalers  . Depression   . Diabetes mellitus without complication (HCC)    sugars run low, not high sugars  . Fever blister   . Hematuria   . Hypertension   . Kidney stone    stones, 2nd lithotripsy  . Left flank pain   . Migraine   . Restless leg   . Yeast vaginitis     Family History: Family History  Problem Relation Age of Onset  . Prostate cancer Father   . Kidney Stones Father   . Kidney disease Neg Hx     Social History   Socioeconomic  History  . Marital status: Divorced    Spouse name: Not on file  . Number of children: Not on file  . Years of education: Not on file  . Highest education level: Not on file  Occupational History  . Not on file  Social Needs  . Financial resource strain: Not on file  . Food insecurity    Worry: Not on file    Inability: Not on file  . Transportation needs  Medical: Not on file    Non-medical: Not on file  Tobacco Use  . Smoking status: Never Smoker  . Smokeless tobacco: Never Used  Substance and Sexual Activity  . Alcohol use: No  . Drug use: No  . Sexual activity: Not on file  Lifestyle  . Physical activity    Days per week: Not on file    Minutes per session: Not on file  . Stress: Not on file  Relationships  . Social Herbalist on phone: Not on file    Gets together: Not on file    Attends religious service: Not on file    Active member of club or organization: Not on file    Attends meetings of clubs or organizations: Not on file    Relationship status: Not on file  . Intimate partner violence    Fear of current or ex partner: Not on file    Emotionally abused: Not on file    Physically abused: Not on file    Forced sexual activity: Not on file  Other Topics Concern  . Not on file  Social History Narrative  . Not on file      Review of Systems  Constitutional: Negative for chills, fatigue and unexpected weight change.  HENT: Negative for congestion, rhinorrhea, sneezing and sore throat.   Eyes: Negative for photophobia, pain and redness.  Respiratory: Negative for cough, chest tightness and shortness of breath.   Cardiovascular: Negative for chest pain and palpitations.  Gastrointestinal: Negative for abdominal pain, constipation, diarrhea, nausea and vomiting.  Endocrine: Negative.   Genitourinary: Negative for dysuria and frequency.  Musculoskeletal: Negative for arthralgias, back pain, joint swelling and neck pain.  Skin: Negative for  rash.  Allergic/Immunologic: Negative.   Neurological: Negative for tremors and numbness.  Hematological: Negative for adenopathy. Does not bruise/bleed easily.  Psychiatric/Behavioral: Negative for behavioral problems and sleep disturbance. The patient is not nervous/anxious.     Vital Signs: BP 116/86   Pulse (!) 107   Resp 16   Ht 5\' 3"  (1.6 m)   Wt 165 lb (74.8 kg)   SpO2 95%   BMI 29.23 kg/m    Physical Exam Vitals signs and nursing note reviewed.  Constitutional:      General: She is not in acute distress.    Appearance: She is well-developed. She is not diaphoretic.  HENT:     Head: Normocephalic and atraumatic.     Mouth/Throat:     Pharynx: No oropharyngeal exudate.  Eyes:     Pupils: Pupils are equal, round, and reactive to light.  Neck:     Musculoskeletal: Normal range of motion and neck supple.     Thyroid: No thyromegaly.     Vascular: No JVD.     Trachea: No tracheal deviation.  Cardiovascular:     Rate and Rhythm: Normal rate and regular rhythm.     Heart sounds: Normal heart sounds. No murmur. No friction rub. No gallop.   Pulmonary:     Effort: Pulmonary effort is normal. No respiratory distress.     Breath sounds: Normal breath sounds. No wheezing or rales.  Chest:     Chest wall: No tenderness.  Abdominal:     Palpations: Abdomen is soft.     Tenderness: There is no abdominal tenderness. There is no guarding.  Musculoskeletal: Normal range of motion.  Lymphadenopathy:     Cervical: No cervical adenopathy.  Skin:    General: Skin is warm  and dry.  Neurological:     Mental Status: She is alert and oriented to person, place, and time.     Cranial Nerves: No cranial nerve deficit.  Psychiatric:        Behavior: Behavior normal.        Thought Content: Thought content normal.        Judgment: Judgment normal.    Assessment/Plan: 1. Chronic obstructive pulmonary disease, unspecified COPD type ([place]) Stable at this time.  Continue inhalers and  other medication use.   2. Essential hypertension Stable, currently 116/86.  Continue present management.   3. Acute upper respiratory infection Resolved, pt completed medications and denies current issue.  Follow up as scheduled.   General Counseling: Miriya verbalizes understanding of the findings of todays visit and agrees with plan of treatment. I have discussed any further diagnostic evaluation that may be needed or ordered today. We also reviewed her medications today. she has been encouraged to call the office with any questions or concerns that should arise related to todays visit.    No orders of the defined types were placed in this encounter.     I have reviewed all medical records from hospital follow up including radiology reports and consults from other physicians. Appropriate follow up diagnostics will be scheduled as needed. Patient/ Family understands the plan of treatment.   Time spent 25 minutes.  Orson Gear AGNP-C Internal Medicine

## 2018-10-20 ENCOUNTER — Other Ambulatory Visit: Payer: Self-pay

## 2018-10-20 MED ORDER — NYSTATIN 100000 UNIT/ML MT SUSP: OROMUCOSAL | 0 refills | Status: AC

## 2018-10-20 NOTE — Telephone Encounter (Signed)
Pt called that she had thrush as per adam send nystatin  To phar

## 2018-10-31 ENCOUNTER — Other Ambulatory Visit: Payer: Self-pay

## 2018-10-31 MED ORDER — NYSTATIN 100000 UNIT/ML MT SUSP: 5.0000 mL | Freq: Three times a day (TID) | OROMUCOSAL | 2 refills | Status: DC

## 2018-11-01 ENCOUNTER — Other Ambulatory Visit: Payer: Self-pay | Admitting: Nurse Practitioner

## 2018-11-01 DIAGNOSIS — G43919 Migraine, unspecified, intractable, without status migrainosus: Secondary | ICD-10-CM

## 2018-11-01 MED ORDER — SUMATRIPTAN SUCCINATE 100 MG PO TABS: 100.0000 mg | ORAL_TABLET | ORAL | 3 refills | Status: DC | PRN

## 2018-11-10 ENCOUNTER — Encounter: Payer: Self-pay | Admitting: Adult Health

## 2018-11-10 ENCOUNTER — Ambulatory Visit (INDEPENDENT_AMBULATORY_CARE_PROVIDER_SITE_OTHER): Payer: Medicare Other | Admitting: Adult Health

## 2018-11-10 ENCOUNTER — Ambulatory Visit
Admission: RE | Admit: 2018-11-10 | Discharge: 2018-11-10 | Disposition: A | Discharge status: Home / Self Care | Payer: Medicare Other | Source: Ambulatory Visit | Attending: Adult Health | Admitting: Adult Health

## 2018-11-10 ENCOUNTER — Other Ambulatory Visit: Payer: Self-pay

## 2018-11-10 ENCOUNTER — Ambulatory Visit
Admission: RE | Admit: 2018-11-10 | Discharge: 2018-11-10 | Disposition: A | Discharge status: Home / Self Care | Payer: Medicare Other | Attending: Adult Health | Admitting: Adult Health

## 2018-11-10 VITALS — Temp 97.7°F

## 2018-11-10 DIAGNOSIS — K219 Gastro-esophageal reflux disease without esophagitis: Secondary | ICD-10-CM | POA: Diagnosis not present

## 2018-11-10 DIAGNOSIS — R0602 Shortness of breath: Secondary | ICD-10-CM

## 2018-11-10 DIAGNOSIS — J4541 Moderate persistent asthma with (acute) exacerbation: Secondary | ICD-10-CM | POA: Diagnosis not present

## 2018-11-10 DIAGNOSIS — R05 Cough: Secondary | ICD-10-CM | POA: Diagnosis not present

## 2018-11-10 MED ORDER — COMBIVENT RESPIMAT 20-100 MCG/ACT IN AERS: 1.0000 | INHALATION_SPRAY | Freq: Four times a day (QID) | RESPIRATORY_TRACT | 0 refills | Status: DC

## 2018-11-10 NOTE — Progress Notes (Signed)
[facility] [place], [place] [address]  Internal MEDICINE  Telephone Visit  Patient Name: Summer Murphy  [id number]  [id number]  Date of Service: 11/10/2018  I connected with the patient at 1200 by telephone and verified the patients identity using two identifiers.   I discussed the limitations, risks, security and privacy concerns of performing an evaluation and management service by telephone and the availability of in person appointments. I also discussed with the patient that there may be a patient responsible charge related to the service.  The patient expressed understanding and agrees to proceed.    Chief Complaint  Patient presents with  . Telephone Assessment  . Telephone Screen  . Cough  . Wheezing  . Shortness of Breath    HPI  Pt seen for telephone visit.  She is complaining of being short of breath with walking.  She reports this has been going on for about a week.  She has been coughing some, but it is not productive.  She denies any lower extremity swelling at this time.    Current Medication: Outpatient Encounter Medications as of 11/10/2018  Medication Sig Note  . ALPRAZolam (XANAX) 0.25 MG tablet Take 0.25 mg by mouth 2 (two) times daily as needed for anxiety.   Marland Kitchen azelastine (ASTELIN) 0.1 % nasal spray once as needed.   . Brexpiprazole (REXULTI) 4 MG TABS Take 4 mg by mouth at bedtime.    . budesonide-formoterol (SYMBICORT) 160-4.5 MCG/ACT inhaler Inhale 2 puffs into the lungs 2 (two) times daily as needed (for respiratory issues.).   Marland Kitchen busPIRone (BUSPAR) 10 MG tablet Take 10 mg by mouth 3 (three) times daily.   . cyclobenzaprine (FLEXERIL) 10 MG tablet Take 1 tablet (10 mg total) by mouth 2 (two) times daily as needed for muscle spasms.   . diclofenac sodium (VOLTAREN) 1 % GEL Apply 2 g topically 2 (two) times daily as needed (for pain).    Marland Kitchen diltiazem (DILACOR XR) 180 MG 24 hr capsule Take 180 mg by mouth daily.   . diphenhydrAMINE  (BENADRYL) 25 MG tablet Take 50 mg by mouth 2 (two) times daily as needed.    . DULoxetine (CYMBALTA) 30 MG capsule Take 30 mg by mouth at bedtime. 07/22/2016: TOTAL DOSE=90 MG  . estrogens, conjugated, (PREMARIN) 0.45 MG tablet Take 0.45 mg by mouth daily.   Marland Kitchen gabapentin (NEURONTIN) 600 MG tablet Take 1 tablet (600 mg total) by mouth 2 (two) times daily.   . hydrOXYzine (ATARAX/VISTARIL) 10 MG tablet Take 1 tablet (10 mg total) by mouth 3 (three) times daily as needed.   . Ipratropium-Albuterol (COMBIVENT RESPIMAT) 20-100 MCG/ACT AERS respimat Inhale 1 puff into the lungs every 6 (six) hours.   Marland Kitchen ipratropium-albuterol (DUONEB) 0.5-2.5 (3) MG/3ML SOLN Take 3 mLs by nebulization every 4 (four) hours as needed (for shortness of breath/wheezing).    Marland Kitchen levocetirizine (XYZAL) 5 MG tablet Take 5 mg by mouth daily.   . montelukast (SINGULAIR) 10 MG tablet Take 1 tablet (10 mg total) by mouth at bedtime.   Marland Kitchen nystatin (MYCOSTATIN) 100000 UNIT/ML suspension Take 5 mLs (500,000 Units total) by mouth 3 (three) times daily.   Marland Kitchen omeprazole (PRILOSEC) 40 MG capsule Take 1 capsule (40 mg total) by mouth daily.   Marland Kitchen oxybutynin (DITROPAN) 5 MG tablet TAKE 1 TABLET(5 MG) BY MOUTH TWICE DAILY   . rOPINIRole (REQUIP) 0.5 MG tablet Take 1 tablet (0.5 mg total) by mouth 2 (two) times daily. IF NEEDED  FOR RESTLESS LEGS--NORMALLY JUST AT BEDTIME   . SUMAtriptan (IMITREX) 100 MG tablet Take 1 tablet (100 mg total) by mouth every 2 (two) hours as needed (for migraine headaches.). May repeat in 1 hours if headache persists or recurs.   . tamsulosin (FLOMAX) 0.4 MG CAPS capsule Take 0.4 mg by mouth daily.    . traZODone (DESYREL) 50 MG tablet Take 50 mg by mouth at bedtime.   . valACYclovir (VALTREX) 500 MG tablet Take 500 mg by mouth daily as needed (for fever blisters).    No facility-administered encounter medications on file as of 11/10/2018.     Surgical History: Past Surgical History:  Procedure Laterality Date  .  ABDOMINAL HYSTERECTOMY    . AMPUTATION TOE Left 07/31/2016   Procedure: AMPUTATION TOE/MPJ 2nd toe;  Surgeon: Sharlotte Alamo, DPM;  Location: [facility] ORS;  Service: Podiatry;  Laterality: Left;  . APPENDECTOMY  1990  . BACK SURGERY    . BREAST SURGERY     bilateral breast reduction  . CARDIAC ELECTROPHYSIOLOGY STUDY AND ABLATION    . CHOLECYSTECTOMY  1990  . COLONOSCOPY WITH PROPOFOL N/A 01/04/2017   Procedure: COLONOSCOPY WITH PROPOFOL;  Surgeon: Manya Silvas, MD;  Location: [facility] ENDOSCOPY;  Service: Endoscopy;  Laterality: N/A;  . CORNEAL TRANSPLANT    . ESOPHAGOGASTRODUODENOSCOPY (EGD) WITH PROPOFOL N/A 01/04/2017   Procedure: ESOPHAGOGASTRODUODENOSCOPY (EGD) WITH PROPOFOL;  Surgeon: Manya Silvas, MD;  Location: [facility] ENDOSCOPY;  Service: Endoscopy;  Laterality: N/A;  . EXCISION BONE CYST Left 07/31/2016   Procedure: EXCISION BONE CYST/exostectomy 28124/left 2nd;  Surgeon: Sharlotte Alamo, DPM;  Location: [facility] ORS;  Service: Podiatry;  Laterality: Left;  . EXTRACORPOREAL SHOCK WAVE LITHOTRIPSY Left 09/12/2015   Procedure: EXTRACORPOREAL SHOCK WAVE LITHOTRIPSY (ESWL);  Surgeon: Hollice Espy, MD;  Location: [facility] ORS;  Service: Urology;  Laterality: Left;  . FRACTURE SURGERY     left foot  . HH repair    . JOINT REPLACEMENT Bilateral 2013,2014   total knees  . LAPAROSCOPIC HYSTERECTOMY    . LITHOTRIPSY    . TONSILLECTOMY    . URETEROSCOPY      Medical History: Past Medical History:  Diagnosis Date  . Acid reflux   . Anxiety   . Arrhythmia    treated with meds and has no current problems  . Arthritis    most uncomfortable in knees  . Asthma    uses several inhalers  . Depression   . Diabetes mellitus without complication (HCC)    sugars run low, not high sugars  . Fever blister   . Hematuria   . Hypertension   . Kidney stone    stones, 2nd lithotripsy  . Left flank pain   . Migraine   . Restless leg   . Yeast vaginitis     Family History: Family History  Problem  Relation Age of Onset  . Prostate cancer Father   . Kidney Stones Father   . Kidney disease Neg Hx     Social History   Socioeconomic History  . Marital status: Divorced    Spouse name: Not on file  . Number of children: Not on file  . Years of education: Not on file  . Highest education level: Not on file  Occupational History  . Not on file  Social Needs  . Financial resource strain: Not on file  . Food insecurity    Worry: Not on file    Inability: Not on file  . Transportation needs  Medical: Not on file    Non-medical: Not on file  Tobacco Use  . Smoking status: Never Smoker  . Smokeless tobacco: Never Used  Substance and Sexual Activity  . Alcohol use: No  . Drug use: No  . Sexual activity: Not on file  Lifestyle  . Physical activity    Days per week: Not on file    Minutes per session: Not on file  . Stress: Not on file  Relationships  . Social Herbalist on phone: Not on file    Gets together: Not on file    Attends religious service: Not on file    Active member of club or organization: Not on file    Attends meetings of clubs or organizations: Not on file    Relationship status: Not on file  . Intimate partner violence    Fear of current or ex partner: Not on file    Emotionally abused: Not on file    Physically abused: Not on file    Forced sexual activity: Not on file  Other Topics Concern  . Not on file  Social History Narrative  . Not on file      Review of Systems  Constitutional: Negative for chills, fatigue and unexpected weight change.  HENT: Negative for congestion, rhinorrhea, sneezing and sore throat.   Eyes: Negative for photophobia, pain and redness.  Respiratory: Negative for cough, chest tightness and shortness of breath.   Cardiovascular: Negative for chest pain and palpitations.  Gastrointestinal: Negative for abdominal pain, constipation, diarrhea, nausea and vomiting.  Endocrine: Negative.   Genitourinary:  Negative for dysuria and frequency.  Musculoskeletal: Negative for arthralgias, back pain, joint swelling and neck pain.  Skin: Negative for rash.  Allergic/Immunologic: Negative.   Neurological: Negative for tremors and numbness.  Hematological: Negative for adenopathy. Does not bruise/bleed easily.  Psychiatric/Behavioral: Negative for behavioral problems and sleep disturbance. The patient is not nervous/anxious.     Vital Signs: Temp 97.7 F (36.5 C)    Observation/Objective:  Sounds well, NAD noted, speaking in full sentences.    Assessment/Plan: 1. Moderate persistent asthma with acute exacerbation Continue to use combivent as directed.  - Ipratropium-Albuterol (COMBIVENT RESPIMAT) 20-100 MCG/ACT AERS respimat; Inhale 1 puff into the lungs every 6 (six) hours.  Dispense: 4 g; Refill: 0  2. Gastroesophageal reflux disease without esophagitis Stable, continue to use omeprazole.   3. SOB (shortness of breath) Get CXR and Echo as discussed.  Instructed patient to go to ER if symptoms worsen.  - DG Chest 2 View; Future - ECHOCARDIOGRAM COMPLETE; Future  General Counseling: Tyaisha verbalizes understanding of the findings of today's phone visit and agrees with plan of treatment. I have discussed any further diagnostic evaluation that may be needed or ordered today. We also reviewed her medications today. she has been encouraged to call the office with any questions or concerns that should arise related to todays visit.    No orders of the defined types were placed in this encounter.   No orders of the defined types were placed in this encounter.   Time spent: [place] AGNP-C Internal medicine

## 2018-11-25 ENCOUNTER — Other Ambulatory Visit: Payer: Medicare Other

## 2018-11-26 ENCOUNTER — Other Ambulatory Visit: Payer: Self-pay | Admitting: Adult Health

## 2018-11-26 DIAGNOSIS — I10 Essential (primary) hypertension: Secondary | ICD-10-CM

## 2018-11-29 ENCOUNTER — Ambulatory Visit: Payer: Medicare Other | Admitting: Nurse Practitioner

## 2018-12-02 ENCOUNTER — Other Ambulatory Visit: Payer: Self-pay

## 2018-12-02 ENCOUNTER — Ambulatory Visit: Payer: Medicare Other

## 2018-12-02 DIAGNOSIS — R0602 Shortness of breath: Secondary | ICD-10-CM | POA: Diagnosis not present

## 2018-12-05 NOTE — Progress Notes (Signed)
Review with patient at visit 12/14/2018

## 2018-12-08 ENCOUNTER — Other Ambulatory Visit: Payer: Self-pay | Admitting: Adult Health

## 2018-12-08 DIAGNOSIS — J4541 Moderate persistent asthma with (acute) exacerbation: Secondary | ICD-10-CM

## 2018-12-12 ENCOUNTER — Other Ambulatory Visit: Payer: Self-pay

## 2018-12-12 DIAGNOSIS — G63 Polyneuropathy in diseases classified elsewhere: Secondary | ICD-10-CM

## 2018-12-12 MED ORDER — GABAPENTIN 600 MG PO TABS: 600.0000 mg | ORAL_TABLET | Freq: Two times a day (BID) | ORAL | 3 refills | Status: DC

## 2018-12-14 ENCOUNTER — Encounter: Payer: Self-pay | Admitting: Nurse Practitioner

## 2018-12-14 ENCOUNTER — Ambulatory Visit (INDEPENDENT_AMBULATORY_CARE_PROVIDER_SITE_OTHER): Payer: Medicare Other | Admitting: Nurse Practitioner

## 2018-12-14 ENCOUNTER — Other Ambulatory Visit: Payer: Self-pay

## 2018-12-14 VITALS — BP 120/85 | HR 96 | Temp 98.5°F | Resp 16 | Ht 63.0 in | Wt 171.0 lb

## 2018-12-14 DIAGNOSIS — R7301 Impaired fasting glucose: Secondary | ICD-10-CM

## 2018-12-14 DIAGNOSIS — I5189 Other ill-defined heart diseases: Secondary | ICD-10-CM | POA: Diagnosis not present

## 2018-12-14 DIAGNOSIS — K449 Diaphragmatic hernia without obstruction or gangrene: Secondary | ICD-10-CM | POA: Insufficient documentation

## 2018-12-14 DIAGNOSIS — R062 Wheezing: Secondary | ICD-10-CM

## 2018-12-14 DIAGNOSIS — R0602 Shortness of breath: Secondary | ICD-10-CM | POA: Diagnosis not present

## 2018-12-14 DIAGNOSIS — J301 Allergic rhinitis due to pollen: Secondary | ICD-10-CM

## 2018-12-14 DIAGNOSIS — K219 Gastro-esophageal reflux disease without esophagitis: Secondary | ICD-10-CM

## 2018-12-14 LAB — POCT GLYCOSYLATED HEMOGLOBIN (HGB A1C): Hemoglobin A1C: 5.9 % — AB (ref 4.0–5.6)

## 2018-12-14 MED ORDER — PREDNISONE 10 MG (21) PO TBPK: ORAL_TABLET | ORAL | 0 refills | Status: DC

## 2018-12-14 MED ORDER — LEVOCETIRIZINE DIHYDROCHLORIDE 5 MG PO TABS: 5.0000 mg | ORAL_TABLET | Freq: Every day | ORAL | 5 refills | Status: DC

## 2018-12-14 NOTE — Progress Notes (Signed)
[facility] [place], [place] [address]  Internal MEDICINE  Office Visit Note  Patient Name: Summer Murphy  [id number]  [id number]  Date of Service: 12/14/2018  Chief Complaint  Patient presents with  . Follow-up    x ray and [place] results  . Hypertension  . Diabetes  . Gastroesophageal Reflux    The patient is here for routine follow up. She continues to have a great deal of shortness of breath, especially with minimal exertion. She had echo and chest x-ray since her last visit. Today, she states that she has ome issues with her allergies. Has some nasal congestion and pressure in her ear. The patient's chest x-rya showing medium-sized hiatal hernia but was otherwise clear. Her echocardiogram indicated normal LVEF with grade II diastolic dysfunction. She has mild mitral and tricuspid regurgitation.       Current Medication: Outpatient Encounter Medications as of 12/14/2018  Medication Sig Note  . ALPRAZolam (XANAX) 0.25 MG tablet Take 0.25 mg by mouth 2 (two) times daily as needed for anxiety.   Marland Kitchen azelastine (ASTELIN) 0.1 % nasal spray once as needed.   . Brexpiprazole (REXULTI) 4 MG TABS Take 4 mg by mouth at bedtime.    . budesonide-formoterol (SYMBICORT) 160-4.5 MCG/ACT inhaler Inhale 2 puffs into the lungs 2 (two) times daily as needed (for respiratory issues.).   Marland Kitchen busPIRone (BUSPAR) 10 MG tablet Take 10 mg by mouth 3 (three) times daily.   . COMBIVENT RESPIMAT 20-100 MCG/ACT AERS respimat INHALE 1 PUFF INTO THE LUNGS EVERY 6 HOURS   . cyclobenzaprine (FLEXERIL) 10 MG tablet Take 1 tablet (10 mg total) by mouth 2 (two) times daily as needed for muscle spasms.   . diclofenac sodium (VOLTAREN) 1 % GEL Apply 2 g topically 2 (two) times daily as needed (for pain).    Marland Kitchen diltiazem (DILACOR XR) 180 MG 24 hr capsule Take 180 mg by mouth daily.   . diphenhydrAMINE (BENADRYL) 25 MG tablet Take 50 mg by mouth 2 (two) times daily as needed.    . DULoxetine  (CYMBALTA) 30 MG capsule Take 30 mg by mouth at bedtime. 07/22/2016: TOTAL DOSE=90 MG  . estrogens, conjugated, (PREMARIN) 0.45 MG tablet Take 0.45 mg by mouth daily.   Marland Kitchen gabapentin (NEURONTIN) 600 MG tablet Take 1 tablet (600 mg total) by mouth 2 (two) times daily.   . hydrOXYzine (ATARAX/VISTARIL) 10 MG tablet Take 1 tablet (10 mg total) by mouth 3 (three) times daily as needed.   Marland Kitchen ipratropium-albuterol (DUONEB) 0.5-2.5 (3) MG/3ML SOLN Take 3 mLs by nebulization every 4 (four) hours as needed (for shortness of breath/wheezing).    Marland Kitchen levocetirizine (XYZAL) 5 MG tablet Take 1 tablet (5 mg total) by mouth daily.   . montelukast (SINGULAIR) 10 MG tablet Take 1 tablet (10 mg total) by mouth at bedtime.   Marland Kitchen nystatin (MYCOSTATIN) 100000 UNIT/ML suspension Take 5 mLs (500,000 Units total) by mouth 3 (three) times daily.   Marland Kitchen omeprazole (PRILOSEC) 40 MG capsule Take 1 capsule (40 mg total) by mouth daily.   Marland Kitchen oxybutynin (DITROPAN) 5 MG tablet TAKE 1 TABLET(5 MG) BY MOUTH TWICE DAILY   . rOPINIRole (REQUIP) 0.5 MG tablet Take 1 tablet (0.5 mg total) by mouth 2 (two) times daily. IF NEEDED FOR RESTLESS LEGS--NORMALLY JUST AT BEDTIME   . SUMAtriptan (IMITREX) 100 MG tablet Take 1 tablet (100 mg total) by mouth every 2 (two) hours as needed (for migraine headaches.). May repeat in 1 hours  if headache persists or recurs.   . tamsulosin (FLOMAX) 0.4 MG CAPS capsule Take 0.4 mg by mouth daily.    . traZODone (DESYREL) 50 MG tablet Take 50 mg by mouth at bedtime.   . valACYclovir (VALTREX) 500 MG tablet Take 500 mg by mouth daily as needed (for fever blisters).   . [DISCONTINUED] levocetirizine (XYZAL) 5 MG tablet Take 5 mg by mouth daily.   . predniSONE (STERAPRED UNI-PAK 21 TAB) 10 MG (21) TBPK tablet 6 day taper - take by mouth as directed for 6 days    No facility-administered encounter medications on file as of 12/14/2018.     Surgical History: Past Surgical History:  Procedure Laterality Date  .  ABDOMINAL HYSTERECTOMY    . AMPUTATION TOE Left 07/31/2016   Procedure: AMPUTATION TOE/MPJ 2nd toe;  Surgeon: Sharlotte Alamo, DPM;  Location: [facility] ORS;  Service: Podiatry;  Laterality: Left;  . APPENDECTOMY  1990  . BACK SURGERY    . BREAST SURGERY     bilateral breast reduction  . CARDIAC ELECTROPHYSIOLOGY STUDY AND ABLATION    . CHOLECYSTECTOMY  1990  . COLONOSCOPY WITH PROPOFOL N/A 01/04/2017   Procedure: COLONOSCOPY WITH PROPOFOL;  Surgeon: Manya Silvas, MD;  Location: [facility] ENDOSCOPY;  Service: Endoscopy;  Laterality: N/A;  . CORNEAL TRANSPLANT    . ESOPHAGOGASTRODUODENOSCOPY (EGD) WITH PROPOFOL N/A 01/04/2017   Procedure: ESOPHAGOGASTRODUODENOSCOPY (EGD) WITH PROPOFOL;  Surgeon: Manya Silvas, MD;  Location: [facility] ENDOSCOPY;  Service: Endoscopy;  Laterality: N/A;  . EXCISION BONE CYST Left 07/31/2016   Procedure: EXCISION BONE CYST/exostectomy 28124/left 2nd;  Surgeon: Sharlotte Alamo, DPM;  Location: [facility] ORS;  Service: Podiatry;  Laterality: Left;  . EXTRACORPOREAL SHOCK WAVE LITHOTRIPSY Left 09/12/2015   Procedure: EXTRACORPOREAL SHOCK WAVE LITHOTRIPSY (ESWL);  Surgeon: Hollice Espy, MD;  Location: [facility] ORS;  Service: Urology;  Laterality: Left;  . FRACTURE SURGERY     left foot  . HH repair    . JOINT REPLACEMENT Bilateral 2013,2014   total knees  . LAPAROSCOPIC HYSTERECTOMY    . LITHOTRIPSY    . TONSILLECTOMY    . URETEROSCOPY      Medical History: Past Medical History:  Diagnosis Date  . Acid reflux   . Anxiety   . Arrhythmia    treated with meds and has no current problems  . Arthritis    most uncomfortable in knees  . Asthma    uses several inhalers  . Depression   . Diabetes mellitus without complication (HCC)    sugars run low, not high sugars  . Fever blister   . Hematuria   . Hypertension   . Kidney stone    stones, 2nd lithotripsy  . Left flank pain   . Migraine   . Restless leg   . Yeast vaginitis     Family History: Family History  Problem  Relation Age of Onset  . Prostate cancer Father   . Kidney Stones Father   . Kidney disease Neg Hx     Social History   Socioeconomic History  . Marital status: Divorced    Spouse name: Not on file  . Number of children: Not on file  . Years of education: Not on file  . Highest education level: Not on file  Occupational History  . Not on file  Social Needs  . Financial resource strain: Not on file  . Food insecurity    Worry: Not on file    Inability: Not on file  . Transportation  needs    Medical: Not on file    Non-medical: Not on file  Tobacco Use  . Smoking status: Never Smoker  . Smokeless tobacco: Never Used  Substance and Sexual Activity  . Alcohol use: No  . Drug use: No  . Sexual activity: Not on file  Lifestyle  . Physical activity    Days per week: Not on file    Minutes per session: Not on file  . Stress: Not on file  Relationships  . Social Herbalist on phone: Not on file    Gets together: Not on file    Attends religious service: Not on file    Active member of club or organization: Not on file    Attends meetings of clubs or organizations: Not on file    Relationship status: Not on file  . Intimate partner violence    Fear of current or ex partner: Not on file    Emotionally abused: Not on file    Physically abused: Not on file    Forced sexual activity: Not on file  Other Topics Concern  . Not on file  Social History Narrative  . Not on file      Review of Systems  Constitutional: Positive for fatigue. Negative for chills and unexpected weight change.  HENT: Positive for congestion, ear pain and postnasal drip. Negative for rhinorrhea, sneezing and sore throat.   Respiratory: Positive for wheezing. Negative for cough, chest tightness and shortness of breath.   Cardiovascular: Negative for chest pain and palpitations.  Gastrointestinal: Negative for abdominal pain, constipation, diarrhea, nausea and vomiting.  Endocrine:  Negative for cold intolerance, heat intolerance, polydipsia and polyuria.       Has impaired fasting blood sugar. Taking steroids or having IV steroids makes her blood sugars higher.   Musculoskeletal: Negative for arthralgias, back pain, joint swelling and neck pain.  Skin: Negative for rash.  Allergic/Immunologic: Positive for environmental allergies.  Neurological: Positive for headaches. Negative for tremors and numbness.  Hematological: Negative for adenopathy. Does not bruise/bleed easily.  Psychiatric/Behavioral: Negative for behavioral problems (Depression), sleep disturbance and suicidal ideas. The patient is not nervous/anxious.        Patient is regularly seeing psychiatry.    Today's Vitals   12/14/18 1114  BP: 120/85  Pulse: 96  Resp: 16  Temp: 98.5 F (36.9 C)  SpO2: 94%  Weight: 171 lb (77.6 kg)  Height: 5\' 3"  (1.6 m)   Body mass index is 30.29 kg/m.  Physical Exam Vitals signs and nursing note reviewed.  Constitutional:      General: She is not in acute distress.    Appearance: Normal appearance. She is well-developed. She is not diaphoretic.  HENT:     Head: Normocephalic and atraumatic.     Right Ear: Tenderness present. Tympanic membrane is erythematous.     Left Ear: Tympanic membrane normal.     Nose: Nose normal.     Mouth/Throat:     Pharynx: No oropharyngeal exudate.  Eyes:     Pupils: Pupils are equal, round, and reactive to light.  Neck:     Musculoskeletal: Normal range of motion and neck supple.     Thyroid: No thyromegaly.     Vascular: No carotid bruit or JVD.     Trachea: No tracheal deviation.  Cardiovascular:     Rate and Rhythm: Normal rate and regular rhythm.     Heart sounds: Normal heart sounds. No murmur. No friction rub.  No gallop.   Pulmonary:     Effort: Pulmonary effort is normal. No respiratory distress.     Breath sounds: Stridor present. Wheezing present. No rales.  Chest:     Chest wall: No tenderness.  Abdominal:      Palpations: Abdomen is soft.  Musculoskeletal: Normal range of motion.  Lymphadenopathy:     Cervical: No cervical adenopathy.  Skin:    General: Skin is warm and dry.  Neurological:     Mental Status: She is alert and oriented to person, place, and time.     Cranial Nerves: No cranial nerve deficit.  Psychiatric:        Behavior: Behavior normal.        Thought Content: Thought content normal.        Judgment: Judgment normal.   Assessment/Plan: 1. Diastolic dysfunction Reviewed results of echocardiogram showing Grade II diastolic dysfunction. Will get home sleep test and myoview for further evaluation.  - Home sleep test - Myocardial Perfusion Imaging  2. SOB (shortness of breath) Start prednisone taper - take as directed for 6 days. Continue to use all inhalers and respiratory medications as prescribed  - Home sleep test - Myocardial Perfusion Imaging - predniSONE (STERAPRED UNI-PAK 21 TAB) 10 MG (21) TBPK tablet; 6 day taper - take by mouth as directed for 6 days  Dispense: 21 tablet; Refill: 0  3. Hiatal hernia with GERD Medium-sized hiatal hernia present on chest x-ray. Will get UGI with barium for further evaluation. May be contributing to increased shortness of breath.  - DG UGI W SINGLE CM (SOL OR THIN BA); Future  4. Wheezing Start prednisone taper - take as directed for 6 days. Continue to use all inhalers and respiratory medications as prescribed  - predniSONE (STERAPRED UNI-PAK 21 TAB) 10 MG (21) TBPK tablet; 6 day taper - take by mouth as directed for 6 days  Dispense: 21 tablet; Refill: 0  5. Seasonal allergic rhinitis due to pollen Restart xyzal 5mg  daily to help improve allergic symptoms.  - levocetirizine (XYZAL) 5 MG tablet; Take 1 tablet (5 mg total) by mouth daily.  Dispense: 30 tablet; Refill: 5  6. Impaired fasting glucose  - POCT HgB A1C 5.9 today. Will continue to monitor every 6 months. Control blood sugars through control of carbohydrate and sugar  intake.   General Counseling: Trinette verbalizes understanding of the findings of todays visit and agrees with plan of treatment. I have discussed any further diagnostic evaluation that may be needed or ordered today. We also reviewed her medications today. she has been encouraged to call the office with any questions or concerns that should arise related to todays visit.  Cardiac risk factor modification:  1. Control blood pressure. 2. Exercise as prescribed. 3. Follow low sodium, low fat diet. and low fat and low cholestrol diet. 4. Take ASA 81mg  once a day. 5. Restricted calories diet to lose weight.  This patient was seen by Leretha Pol FNP Collaboration with Dr Lavera Guise as a part of collaborative care agreement  Orders Placed This Encounter  Procedures  . DG UGI W SINGLE CM (SOL OR THIN BA)  . Myocardial Perfusion Imaging  . POCT HgB A1C  . Home sleep test    Meds ordered this encounter  Medications  . levocetirizine (XYZAL) 5 MG tablet    Sig: Take 1 tablet (5 mg total) by mouth daily.    Dispense:  30 tablet    Refill:  5  Order Specific Question:   Supervising Provider    Answer:   Lavera Guise [id number]  . predniSONE (STERAPRED UNI-PAK 21 TAB) 10 MG (21) TBPK tablet    Sig: 6 day taper - take by mouth as directed for 6 days    Dispense:  21 tablet    Refill:  0    Order Specific Question:   Supervising Provider    Answer:   Lavera Guise [id number]    Time spent: 38 Minutes      Dr Lavera Guise Internal medicine

## 2018-12-23 ENCOUNTER — Telehealth (HOSPITAL_COMMUNITY): Payer: Self-pay | Admitting: *Deleted

## 2018-12-23 NOTE — Telephone Encounter (Signed)
Patient given detailed instructions per Myocardial Perfusion Study Information Sheet for the test on 12/26/18 at 10:30. Patient notified to arrive 15 minutes early and that it is imperative to arrive on time for appointment to keep from having the test rescheduled.  If you need to cancel or reschedule your appointment, please call the office within 24 hours of your appointment. . Patient verbalized understanding.Summer Murphy

## 2018-12-26 ENCOUNTER — Encounter (HOSPITAL_COMMUNITY): Payer: Medicare Other

## 2018-12-29 ENCOUNTER — Ambulatory Visit: Payer: Medicare Other

## 2018-12-30 ENCOUNTER — Encounter (HOSPITAL_COMMUNITY): Payer: Self-pay | Admitting: Nurse Practitioner

## 2019-01-02 ENCOUNTER — Other Ambulatory Visit: Payer: Medicare Other | Admitting: Internal Medicine

## 2019-01-03 ENCOUNTER — Other Ambulatory Visit: Payer: Medicare Other

## 2019-01-04 ENCOUNTER — Other Ambulatory Visit: Payer: Self-pay | Admitting: Adult Health

## 2019-01-04 DIAGNOSIS — R062 Wheezing: Secondary | ICD-10-CM

## 2019-01-04 DIAGNOSIS — R0602 Shortness of breath: Secondary | ICD-10-CM

## 2019-01-04 MED ORDER — PREDNISONE 10 MG (21) PO TBPK: ORAL_TABLET | ORAL | 0 refills | Status: DC

## 2019-01-04 NOTE — Telephone Encounter (Signed)
Patient called states that her chest has started to feel tight , is using inhalers , will start nebulizer prednisone sent into pharmacy if nebulizer does not seem to work.

## 2019-01-05 ENCOUNTER — Other Ambulatory Visit: Payer: Self-pay | Admitting: Nurse Practitioner

## 2019-01-05 MED ORDER — ROPINIROLE HCL 0.5 MG PO TABS: 0.5000 mg | ORAL_TABLET | Freq: Two times a day (BID) | ORAL | 3 refills | Status: DC

## 2019-01-09 ENCOUNTER — Other Ambulatory Visit: Payer: Self-pay | Admitting: Adult Health

## 2019-01-09 ENCOUNTER — Ambulatory Visit: Payer: Medicare Other | Admitting: Internal Medicine

## 2019-01-09 DIAGNOSIS — R32 Unspecified urinary incontinence: Secondary | ICD-10-CM

## 2019-01-09 NOTE — Telephone Encounter (Signed)
Pt has cancelled multiple app, refill only 3 month supply and should have a routine follow up in jan 2021

## 2019-01-13 ENCOUNTER — Other Ambulatory Visit: Payer: Self-pay | Admitting: Nurse Practitioner

## 2019-01-13 ENCOUNTER — Telehealth: Payer: Self-pay | Admitting: Nurse Practitioner

## 2019-01-13 DIAGNOSIS — H60501 Unspecified acute noninfective otitis externa, right ear: Secondary | ICD-10-CM

## 2019-01-13 DIAGNOSIS — R42 Dizziness and giddiness: Secondary | ICD-10-CM

## 2019-01-13 MED ORDER — CIPROFLOXACIN-DEXAMETHASONE 0.3-0.1 % OT SUSP: 4.0000 [drp] | Freq: Two times a day (BID) | OTIC | 0 refills | Status: DC

## 2019-01-13 MED ORDER — MECLIZINE HCL 12.5 MG PO TABS: 12.5000 mg | ORAL_TABLET | Freq: Two times a day (BID) | ORAL | 0 refills | Status: DC | PRN

## 2019-01-13 NOTE — Telephone Encounter (Signed)
Add ciprodex ear drops. Use four drops in affected ear twice daily for next 7 days. Add meclizine 12.5mg  up to twice daily as needed for vertigo. Both prescriptions were sent to her pharmacy.

## 2019-01-13 NOTE — Telephone Encounter (Signed)
Patient called and states that she thinks she has inner ear, her right ear is hurting and she is dizzy . Please advise

## 2019-01-13 NOTE — Progress Notes (Signed)
Add ciprodex ear drops. Use four drops in affected ear twice daily for next 7 days. Add meclizine 12.5mg  up to twice daily as needed for vertigo. Both prescriptions were sent to her pharmacy.

## 2019-01-25 ENCOUNTER — Other Ambulatory Visit: Payer: Self-pay | Admitting: Adult Health

## 2019-01-25 DIAGNOSIS — R062 Wheezing: Secondary | ICD-10-CM

## 2019-01-25 DIAGNOSIS — R0602 Shortness of breath: Secondary | ICD-10-CM

## 2019-01-25 MED ORDER — IPRATROPIUM-ALBUTEROL 0.5-2.5 (3) MG/3ML IN SOLN: 3.0000 mL | RESPIRATORY_TRACT | 6 refills | Status: DC | PRN

## 2019-01-25 MED ORDER — PREDNISONE 10 MG (21) PO TBPK: ORAL_TABLET | ORAL | 0 refills | Status: DC

## 2019-01-25 NOTE — Telephone Encounter (Signed)
Pt asthma has been worse here lately , pt is to use nebulizer 6 times a day , and prednisone added to see if can help get under control , pt advised to call us and or go to er if symptoms get worse

## 2019-02-01 DIAGNOSIS — Z79899 Other long term (current) drug therapy: Secondary | ICD-10-CM | POA: Diagnosis not present

## 2019-02-02 ENCOUNTER — Encounter: Payer: Self-pay | Admitting: Adult Health

## 2019-02-02 ENCOUNTER — Other Ambulatory Visit: Payer: Self-pay

## 2019-02-02 ENCOUNTER — Ambulatory Visit (INDEPENDENT_AMBULATORY_CARE_PROVIDER_SITE_OTHER): Payer: Medicare Other | Admitting: Adult Health

## 2019-02-02 VITALS — BP 110/60 | Temp 97.4°F

## 2019-02-02 DIAGNOSIS — Z20828 Contact with and (suspected) exposure to other viral communicable diseases: Secondary | ICD-10-CM

## 2019-02-02 DIAGNOSIS — J4541 Moderate persistent asthma with (acute) exacerbation: Secondary | ICD-10-CM

## 2019-02-02 DIAGNOSIS — J301 Allergic rhinitis due to pollen: Secondary | ICD-10-CM

## 2019-02-02 DIAGNOSIS — Z20822 Contact with and (suspected) exposure to covid-19: Secondary | ICD-10-CM

## 2019-02-02 NOTE — Progress Notes (Signed)
[facility], [place] [address]  Internal MEDICINE  Telephone Visit  Patient Name: Summer Murphy  [id number]  [id number]  Date of Service: 02/02/2019  I connected with the patient at 1056 by telephone and verified the patients identity using two identifiers.   I discussed the limitations, risks, security and privacy concerns of performing an evaluation and management service by telephone and the availability of in person appointments. I also discussed with the patient that there may be a patient responsible charge related to the service.  The patient expressed understanding and agrees to proceed.    Chief Complaint  Patient presents with  . Telephone Assessment  . Telephone Screen  . Wheezing    exposure to covid     HPI  Pt seen via telephone.  She reports two days ago exposed to friend who has tested positive for covid.  She denies any symptoms currently.  Denies fever, or cough.  She does have some wheezing, which has been intermittent for her over the last few weeks. She is using her inhalers, and nebulizer which does alleviate her wheezing.  She reports when the weather changes she always struggles with wheezing.      Current Medication: Outpatient Encounter Medications as of 02/02/2019  Medication Sig Note  . ALPRAZolam (XANAX) 0.25 MG tablet Take 0.25 mg by mouth 2 (two) times daily as needed for anxiety.   Marland Kitchen azelastine (ASTELIN) 0.1 % nasal spray once as needed.   . Brexpiprazole (REXULTI) 4 MG TABS Take 4 mg by mouth at bedtime.    . budesonide-formoterol (SYMBICORT) 160-4.5 MCG/ACT inhaler Inhale 2 puffs into the lungs 2 (two) times daily as needed (for respiratory issues.).   Marland Kitchen busPIRone (BUSPAR) 10 MG tablet Take 10 mg by mouth 3 (three) times daily.   . ciprofloxacin-dexamethasone (CIPRODEX) OTIC suspension Place 4 drops into the right ear 2 (two) times daily.   . COMBIVENT RESPIMAT 20-100 MCG/ACT AERS respimat INHALE 1 PUFF INTO THE  LUNGS EVERY 6 HOURS   . cyclobenzaprine (FLEXERIL) 10 MG tablet Take 1 tablet (10 mg total) by mouth 2 (two) times daily as needed for muscle spasms.   . diclofenac sodium (VOLTAREN) 1 % GEL Apply 2 g topically 2 (two) times daily as needed (for pain).    Marland Kitchen diltiazem (DILACOR XR) 180 MG 24 hr capsule Take 180 mg by mouth daily.   . diphenhydrAMINE (BENADRYL) 25 MG tablet Take 50 mg by mouth 2 (two) times daily as needed.    . DULoxetine (CYMBALTA) 30 MG capsule Take 30 mg by mouth at bedtime. 07/22/2016: TOTAL DOSE=90 MG  . estrogens, conjugated, (PREMARIN) 0.45 MG tablet Take 0.45 mg by mouth daily.   Marland Kitchen gabapentin (NEURONTIN) 600 MG tablet Take 1 tablet (600 mg total) by mouth 2 (two) times daily.   . hydrOXYzine (ATARAX/VISTARIL) 10 MG tablet Take 1 tablet (10 mg total) by mouth 3 (three) times daily as needed.   Marland Kitchen ipratropium-albuterol (DUONEB) 0.5-2.5 (3) MG/3ML SOLN Take 3 mLs by nebulization every 4 (four) hours as needed (for shortness of breath/wheezing).   Marland Kitchen levocetirizine (XYZAL) 5 MG tablet Take 1 tablet (5 mg total) by mouth daily.   . meclizine (ANTIVERT) 12.5 MG tablet Take 1 tablet (12.5 mg total) by mouth 2 (two) times daily as needed for dizziness.   . montelukast (SINGULAIR) 10 MG tablet Take 1 tablet (10 mg total) by mouth at bedtime.   Marland Kitchen nystatin (MYCOSTATIN) 100000 UNIT/ML suspension Take 5  mLs (500,000 Units total) by mouth 3 (three) times daily.   Marland Kitchen omeprazole (PRILOSEC) 40 MG capsule Take 1 capsule (40 mg total) by mouth daily.   Marland Kitchen oxybutynin (DITROPAN) 5 MG tablet TAKE 1 TABLET(5 MG) BY MOUTH TWICE DAILY   . predniSONE (STERAPRED UNI-PAK 21 TAB) 10 MG (21) TBPK tablet 6 day taper - take by mouth as directed for 6 days   . rOPINIRole (REQUIP) 0.5 MG tablet Take 1 tablet (0.5 mg total) by mouth 2 (two) times daily. IF NEEDED FOR RESTLESS LEGS--NORMALLY JUST AT BEDTIME   . SUMAtriptan (IMITREX) 100 MG tablet Take 1 tablet (100 mg total) by mouth every 2 (two) hours as needed  (for migraine headaches.). May repeat in 1 hours if headache persists or recurs.   . tamsulosin (FLOMAX) 0.4 MG CAPS capsule Take 0.4 mg by mouth daily.    . traZODone (DESYREL) 50 MG tablet Take 50 mg by mouth at bedtime.   . valACYclovir (VALTREX) 500 MG tablet Take 500 mg by mouth daily as needed (for fever blisters).    No facility-administered encounter medications on file as of 02/02/2019.     Surgical History: Past Surgical History:  Procedure Laterality Date  . ABDOMINAL HYSTERECTOMY    . AMPUTATION TOE Left 07/31/2016   Procedure: AMPUTATION TOE/MPJ 2nd toe;  Surgeon: Sharlotte Alamo, DPM;  Location: [facility] ORS;  Service: Podiatry;  Laterality: Left;  . APPENDECTOMY  1990  . BACK SURGERY    . BREAST SURGERY     bilateral breast reduction  . CARDIAC ELECTROPHYSIOLOGY STUDY AND ABLATION    . CHOLECYSTECTOMY  1990  . COLONOSCOPY WITH PROPOFOL N/A 01/04/2017   Procedure: COLONOSCOPY WITH PROPOFOL;  Surgeon: Manya Silvas, MD;  Location: [facility] ENDOSCOPY;  Service: Endoscopy;  Laterality: N/A;  . CORNEAL TRANSPLANT    . ESOPHAGOGASTRODUODENOSCOPY (EGD) WITH PROPOFOL N/A 01/04/2017   Procedure: ESOPHAGOGASTRODUODENOSCOPY (EGD) WITH PROPOFOL;  Surgeon: Manya Silvas, MD;  Location: [facility] ENDOSCOPY;  Service: Endoscopy;  Laterality: N/A;  . EXCISION BONE CYST Left 07/31/2016   Procedure: EXCISION BONE CYST/exostectomy 28124/left 2nd;  Surgeon: Sharlotte Alamo, DPM;  Location: [facility] ORS;  Service: Podiatry;  Laterality: Left;  . EXTRACORPOREAL SHOCK WAVE LITHOTRIPSY Left 09/12/2015   Procedure: EXTRACORPOREAL SHOCK WAVE LITHOTRIPSY (ESWL);  Surgeon: Hollice Espy, MD;  Location: [facility] ORS;  Service: Urology;  Laterality: Left;  . FRACTURE SURGERY     left foot  . HH repair    . JOINT REPLACEMENT Bilateral 2013,2014   total knees  . LAPAROSCOPIC HYSTERECTOMY    . LITHOTRIPSY    . TONSILLECTOMY    . URETEROSCOPY      Medical History: Past Medical History:  Diagnosis Date  . Acid  reflux   . Anxiety   . Arrhythmia    treated with meds and has no current problems  . Arthritis    most uncomfortable in knees  . Asthma    uses several inhalers  . Depression   . Diabetes mellitus without complication (HCC)    sugars run low, not high sugars  . Fever blister   . Hematuria   . Hypertension   . Kidney stone    stones, 2nd lithotripsy  . Left flank pain   . Migraine   . Restless leg   . Yeast vaginitis     Family History: Family History  Problem Relation Age of Onset  . Prostate cancer Father   . Kidney Stones Father   . Kidney disease Neg Hx  Social History   Socioeconomic History  . Marital status: Divorced    Spouse name: Not on file  . Number of children: Not on file  . Years of education: Not on file  . Highest education level: Not on file  Occupational History  . Not on file  Social Needs  . Financial resource strain: Not on file  . Food insecurity    Worry: Not on file    Inability: Not on file  . Transportation needs    Medical: Not on file    Non-medical: Not on file  Tobacco Use  . Smoking status: Never Smoker  . Smokeless tobacco: Never Used  Substance and Sexual Activity  . Alcohol use: No  . Drug use: No  . Sexual activity: Not on file  Lifestyle  . Physical activity    Days per week: Not on file    Minutes per session: Not on file  . Stress: Not on file  Relationships  . Social Herbalist on phone: Not on file    Gets together: Not on file    Attends religious service: Not on file    Active member of club or organization: Not on file    Attends meetings of clubs or organizations: Not on file    Relationship status: Not on file  . Intimate partner violence    Fear of current or ex partner: Not on file    Emotionally abused: Not on file    Physically abused: Not on file    Forced sexual activity: Not on file  Other Topics Concern  . Not on file  Social History Narrative  . Not on file      Review  of Systems  Constitutional: Negative for chills, fatigue and unexpected weight change.  HENT: Negative for congestion, rhinorrhea, sneezing and sore throat.   Eyes: Negative for photophobia, pain and redness.  Respiratory: Positive for wheezing. Negative for cough, chest tightness and shortness of breath.   Cardiovascular: Negative for chest pain and palpitations.  Gastrointestinal: Negative for abdominal pain, constipation, diarrhea, nausea and vomiting.  Endocrine: Negative.   Genitourinary: Negative for dysuria and frequency.  Musculoskeletal: Negative for arthralgias, back pain, joint swelling and neck pain.  Skin: Negative for rash.  Allergic/Immunologic: Negative.   Neurological: Negative for tremors and numbness.  Hematological: Negative for adenopathy. Does not bruise/bleed easily.  Psychiatric/Behavioral: Negative for behavioral problems and sleep disturbance. The patient is not nervous/anxious.     Vital Signs: There were no vitals taken for this visit.   Observation/Objective:  Well sounding at this time.    Assessment/Plan: 1. Close exposure to COVID-19 virus Encouraged patient to isolate, and have covid test done.  She verbalized understanding. Will follow up with new or worsening symptoms develop.   2. Seasonal allergic rhinitis due to pollen Continue current treatment.   3. Moderate persistent asthma with acute exacerbation Continue to use inhalers and nebulizer as discussed.  If symptoms persist, or cough/fever develop she will return to clinic. She declined steroid therapy at this time.  General Counseling: Yaslin verbalizes understanding of the findings of today's phone visit and agrees with plan of treatment. I have discussed any further diagnostic evaluation that may be needed or ordered today. We also reviewed her medications today. she has been encouraged to call the office with any questions or concerns that should arise related to todays visit.    No  orders of the defined types were placed in this encounter.  No orders of the defined types were placed in this encounter.   Time spent: [place] AGNP-C Internal medicine

## 2019-02-09 ENCOUNTER — Other Ambulatory Visit: Payer: Self-pay | Admitting: Nurse Practitioner

## 2019-02-09 DIAGNOSIS — K2270 Barrett's esophagus without dysplasia: Secondary | ICD-10-CM

## 2019-02-09 MED ORDER — OMEPRAZOLE 40 MG PO CPDR: 40.0000 mg | DELAYED_RELEASE_CAPSULE | Freq: Every day | ORAL | 3 refills | Status: DC

## 2019-02-23 NOTE — Telephone Encounter (Signed)
Please sure she has a follow up in [place]

## 2019-02-24 NOTE — Telephone Encounter (Signed)
Patient scheduled on 03/30/2019 for routine fu. klh

## 2019-03-28 ENCOUNTER — Telehealth: Payer: Self-pay

## 2019-03-28 NOTE — Telephone Encounter (Signed)
CONFIRMED AND SCREENED FOR 03-30-19 OV. 

## 2019-03-30 ENCOUNTER — Ambulatory Visit (INDEPENDENT_AMBULATORY_CARE_PROVIDER_SITE_OTHER): Payer: Medicare Other | Admitting: Nurse Practitioner

## 2019-03-30 ENCOUNTER — Other Ambulatory Visit: Payer: Self-pay

## 2019-03-30 ENCOUNTER — Encounter: Payer: Self-pay | Admitting: Nurse Practitioner

## 2019-03-30 VITALS — BP 139/75 | HR 108 | Temp 98.2°F | Resp 16 | Ht 63.0 in | Wt 172.0 lb

## 2019-03-30 DIAGNOSIS — I10 Essential (primary) hypertension: Secondary | ICD-10-CM

## 2019-03-30 DIAGNOSIS — M545 Low back pain, unspecified: Secondary | ICD-10-CM

## 2019-03-30 DIAGNOSIS — J4541 Moderate persistent asthma with (acute) exacerbation: Secondary | ICD-10-CM | POA: Diagnosis not present

## 2019-03-30 DIAGNOSIS — R7303 Prediabetes: Secondary | ICD-10-CM | POA: Diagnosis not present

## 2019-03-30 DIAGNOSIS — R252 Cramp and spasm: Secondary | ICD-10-CM | POA: Diagnosis not present

## 2019-03-30 MED ORDER — PREDNISONE 10 MG (21) PO TBPK: ORAL_TABLET | ORAL | 0 refills | Status: DC

## 2019-03-30 MED ORDER — SPIRIVA RESPIMAT 1.25 MCG/ACT IN AERS: 2.0000 | INHALATION_SPRAY | Freq: Every day | RESPIRATORY_TRACT | 3 refills | Status: DC

## 2019-03-30 NOTE — Progress Notes (Signed)
[facility] [place], [place] [address]  Internal MEDICINE  Office Visit Note  Patient Name: Summer Murphy  [id number]  [id number]  Date of Service: 04/02/2019  Chief Complaint  Patient presents with  . Hypertension  . Gastroesophageal Reflux  . Diabetes    Summer Murphy presents with c/o shortness of breath, usually occurring with activity, but occasionally at rest. She also reports a productive cough with thick white sputum. She states that she has been using her Combivent inhaler approximately every 30-45 minutes when short of breath. She also states that she has Symbicort and Duoneb at home but does not use them. Summer Murphy also c/o low back pain 8/10 aggravated by being up and active or sitting for prolonged periods. Pain started about a month ago. She reports having previous back surgery many years ago, but states that she has not had back pain this severe before. She describes the pain as stabbing. She is taking Tylenol but it only provides minimal relief. Lying down relieves the pain. She reports that she has had slighty increased physical activity recently due to caring for a friend, but denies any recent trauma or injuries. She has also been having charlie horses and bilateral hand spasms that cause a locking flexion.      Current Medication: Outpatient Encounter Medications as of 03/30/2019  Medication Sig Note  . ALPRAZolam (XANAX) 0.25 MG tablet Take 0.25 mg by mouth 2 (two) times daily as needed for anxiety.   Marland Kitchen azelastine (ASTELIN) 0.1 % nasal spray once as needed.   . Brexpiprazole (REXULTI) 4 MG TABS Take 4 mg by mouth at bedtime.    . budesonide-formoterol (SYMBICORT) 160-4.5 MCG/ACT inhaler Inhale 2 puffs into the lungs 2 (two) times daily as needed (for respiratory issues.).   Marland Kitchen busPIRone (BUSPAR) 10 MG tablet Take 10 mg by mouth 3 (three) times daily.   . COMBIVENT RESPIMAT 20-100 MCG/ACT AERS respimat INHALE 1 PUFF INTO THE LUNGS EVERY 6  HOURS   . cyclobenzaprine (FLEXERIL) 10 MG tablet Take 1 tablet (10 mg total) by mouth 2 (two) times daily as needed for muscle spasms.   . diclofenac sodium (VOLTAREN) 1 % GEL Apply 2 g topically 2 (two) times daily as needed (for pain).    Marland Kitchen diltiazem (DILACOR XR) 180 MG 24 hr capsule Take 180 mg by mouth daily.   . diphenhydrAMINE (BENADRYL) 25 MG tablet Take 50 mg by mouth 2 (two) times daily as needed.    . DULoxetine (CYMBALTA) 30 MG capsule Take 30 mg by mouth at bedtime. 07/22/2016: TOTAL DOSE=90 MG  . estrogens, conjugated, (PREMARIN) 0.45 MG tablet Take 0.45 mg by mouth daily.   Marland Kitchen gabapentin (NEURONTIN) 600 MG tablet Take 1 tablet (600 mg total) by mouth 2 (two) times daily.   . hydrOXYzine (ATARAX/VISTARIL) 10 MG tablet Take 1 tablet (10 mg total) by mouth 3 (three) times daily as needed.   Marland Kitchen ipratropium-albuterol (DUONEB) 0.5-2.5 (3) MG/3ML SOLN Take 3 mLs by nebulization every 4 (four) hours as needed (for shortness of breath/wheezing).   Marland Kitchen levocetirizine (XYZAL) 5 MG tablet Take 1 tablet (5 mg total) by mouth daily.   . meclizine (ANTIVERT) 12.5 MG tablet Take 1 tablet (12.5 mg total) by mouth 2 (two) times daily as needed for dizziness.   . montelukast (SINGULAIR) 10 MG tablet Take 1 tablet (10 mg total) by mouth at bedtime.   Marland Kitchen nystatin (MYCOSTATIN) 100000 UNIT/ML suspension Take 5 mLs (500,000 Units total) by  mouth 3 (three) times daily.   Marland Kitchen omeprazole (PRILOSEC) 40 MG capsule Take 1 capsule (40 mg total) by mouth daily.   Marland Kitchen oxybutynin (DITROPAN) 5 MG tablet TAKE 1 TABLET(5 MG) BY MOUTH TWICE DAILY   . rOPINIRole (REQUIP) 0.5 MG tablet Take 1 tablet (0.5 mg total) by mouth 2 (two) times daily. IF NEEDED FOR RESTLESS LEGS--NORMALLY JUST AT BEDTIME   . SUMAtriptan (IMITREX) 100 MG tablet Take 1 tablet (100 mg total) by mouth every 2 (two) hours as needed (for migraine headaches.). May repeat in 1 hours if headache persists or recurs.   . tamsulosin (FLOMAX) 0.4 MG CAPS capsule Take 0.4  mg by mouth daily.    . traZODone (DESYREL) 50 MG tablet Take 50 mg by mouth at bedtime.   . valACYclovir (VALTREX) 500 MG tablet Take 500 mg by mouth daily as needed (for fever blisters).   . [DISCONTINUED] ciprofloxacin-dexamethasone (CIPRODEX) OTIC suspension Place 4 drops into the right ear 2 (two) times daily.   . predniSONE (STERAPRED UNI-PAK 21 TAB) 10 MG (21) TBPK tablet 6 day taper - take by mouth as directed for 6 days   . Tiotropium Bromide Monohydrate (SPIRIVA RESPIMAT) 1.25 MCG/ACT AERS Inhale 2 puffs into the lungs daily.   . [DISCONTINUED] predniSONE (STERAPRED UNI-PAK 21 TAB) 10 MG (21) TBPK tablet 6 day taper - take by mouth as directed for 6 days (Patient not taking: Reported on 03/30/2019)    No facility-administered encounter medications on file as of 03/30/2019.    Surgical History: Past Surgical History:  Procedure Laterality Date  . ABDOMINAL HYSTERECTOMY    . AMPUTATION TOE Left 07/31/2016   Procedure: AMPUTATION TOE/MPJ 2nd toe;  Surgeon: Sharlotte Alamo, DPM;  Location: [facility] ORS;  Service: Podiatry;  Laterality: Left;  . APPENDECTOMY  1990  . BACK SURGERY    . BREAST SURGERY     bilateral breast reduction  . CARDIAC ELECTROPHYSIOLOGY STUDY AND ABLATION    . CHOLECYSTECTOMY  1990  . COLONOSCOPY WITH PROPOFOL N/A 01/04/2017   Procedure: COLONOSCOPY WITH PROPOFOL;  Surgeon: Manya Silvas, MD;  Location: [facility] ENDOSCOPY;  Service: Endoscopy;  Laterality: N/A;  . CORNEAL TRANSPLANT    . ESOPHAGOGASTRODUODENOSCOPY (EGD) WITH PROPOFOL N/A 01/04/2017   Procedure: ESOPHAGOGASTRODUODENOSCOPY (EGD) WITH PROPOFOL;  Surgeon: Manya Silvas, MD;  Location: [facility] ENDOSCOPY;  Service: Endoscopy;  Laterality: N/A;  . EXCISION BONE CYST Left 07/31/2016   Procedure: EXCISION BONE CYST/exostectomy 28124/left 2nd;  Surgeon: Sharlotte Alamo, DPM;  Location: [facility] ORS;  Service: Podiatry;  Laterality: Left;  . EXTRACORPOREAL SHOCK WAVE LITHOTRIPSY Left 09/12/2015   Procedure: EXTRACORPOREAL  SHOCK WAVE LITHOTRIPSY (ESWL);  Surgeon: Hollice Espy, MD;  Location: [facility] ORS;  Service: Urology;  Laterality: Left;  . FRACTURE SURGERY     left foot  . HH repair    . JOINT REPLACEMENT Bilateral 2013,2014   total knees  . LAPAROSCOPIC HYSTERECTOMY    . LITHOTRIPSY    . TONSILLECTOMY    . URETEROSCOPY      Medical History: Past Medical History:  Diagnosis Date  . Acid reflux   . Anxiety   . Arrhythmia    treated with meds and has no current problems  . Arthritis    most uncomfortable in knees  . Asthma    uses several inhalers  . Depression   . Diabetes mellitus without complication (HCC)    sugars run low, not high sugars  . Fever blister   . Hematuria   . Hypertension   .  Kidney stone    stones, 2nd lithotripsy  . Left flank pain   . Migraine   . Restless leg   . Yeast vaginitis     Family History: Family History  Problem Relation Age of Onset  . Prostate cancer Father   . Kidney Stones Father   . Kidney disease Neg Hx     Social History   Socioeconomic History  . Marital status: Divorced    Spouse name: Not on file  . Number of children: Not on file  . Years of education: Not on file  . Highest education level: Not on file  Occupational History  . Not on file  Tobacco Use  . Smoking status: Never Smoker  . Smokeless tobacco: Never Used  Substance and Sexual Activity  . Alcohol use: No  . Drug use: No  . Sexual activity: Not on file  Other Topics Concern  . Not on file  Social History Narrative  . Not on file   Social Determinants of Health   Financial Resource Strain:   . Difficulty of Paying Living Expenses: Not on file  Food Insecurity:   . Worried About Charity fundraiser in the Last Year: Not on file  . Ran Out of Food in the Last Year: Not on file  Transportation Needs:   . Lack of Transportation (Medical): Not on file  . Lack of Transportation (Non-Medical): Not on file  Physical Activity:   . Days of Exercise per Week: Not  on file  . Minutes of Exercise per Session: Not on file  Stress:   . Feeling of Stress : Not on file  Social Connections:   . Frequency of Communication with Friends and Family: Not on file  . Frequency of Social Gatherings with Friends and Family: Not on file  . Attends Religious Services: Not on file  . Active Member of Clubs or Organizations: Not on file  . Attends Archivist Meetings: Not on file  . Marital Status: Not on file  Intimate Partner Violence:   . Fear of Current or Ex-Partner: Not on file  . Emotionally Abused: Not on file  . Physically Abused: Not on file  . Sexually Abused: Not on file      Review of Systems  Constitutional: Positive for fatigue. Negative for chills and fever.  HENT: Negative for congestion, sinus pressure, sinus pain and sore throat.   Respiratory: Positive for shortness of breath and wheezing.   Cardiovascular: Negative for chest pain and palpitations.  Gastrointestinal: Negative for constipation, diarrhea and vomiting.  Endocrine: Negative for cold intolerance, heat intolerance, polydipsia and polyuria.  Musculoskeletal: Positive for arthralgias and back pain.  Allergic/Immunologic: Positive for environmental allergies and food allergies.  Neurological: Positive for headaches. Negative for dizziness and light-headedness.  Hematological: Negative for adenopathy.  Psychiatric/Behavioral: Positive for dysphoric mood. The patient is hyperactive.     Today's Vitals   03/30/19 1457  BP: 139/75  Pulse: (!) 108  Resp: 16  Temp: 98.2 F (36.8 C)  SpO2: 95%  Weight: 172 lb (78 kg)  Height: 5\' 3"  (1.6 m)   Body mass index is 30.47 kg/m.  Physical Exam Vitals (Appears jittery) and nursing note reviewed.  Constitutional:      Appearance: Normal appearance.  HENT:     Head: Normocephalic and atraumatic.     Nose: Nose normal.  Eyes:     Pupils: Pupils are equal, round, and reactive to light.  Cardiovascular:  Rate and  Rhythm: Regular rhythm. Tachycardia present.     Heart sounds: Normal heart sounds.  Pulmonary:     Effort: Respiratory distress present.     Breath sounds: Examination of the right-upper field reveals wheezing. Examination of the left-upper field reveals wheezing. Examination of the right-middle field reveals wheezing. Examination of the left-middle field reveals wheezing. Examination of the right-lower field reveals wheezing. Examination of the left-lower field reveals wheezing. Wheezing present.  Abdominal:     Palpations: Abdomen is soft.  Musculoskeletal:        General: Normal range of motion.     Cervical back: Normal range of motion and neck supple.     Comments: Moderate lower back pain. Worse with bending and twisting at the waist. No visible or palpable abnormalities.   Skin:    General: Skin is warm and dry.  Neurological:     Mental Status: She is alert and oriented to person, place, and time.  Psychiatric:        Attention and Perception: Attention and perception normal.        Mood and Affect: Mood is anxious.        Speech: Speech normal.        Behavior: Behavior normal. Behavior is cooperative.        Thought Content: Thought content normal.        Cognition and Memory: Cognition normal.        Judgment: Judgment normal.    Assessment/Plan:  1. Moderate persistent asthma with acute exacerbation Start prednisone taper. Take as directed for 6 days. Will get new pulmonary function test for further evaluation. Recommend further evaluation with pulmonology after PFT. Recommend she take maintenance inhalers as prescribed. Use duoneb as needed and as prescribed. Continue to use rescue inhaler as needed and as prescribed. - Pulmonary function test; Future - Tiotropium Bromide Monohydrate (SPIRIVA RESPIMAT) 1.25 MCG/ACT AERS; Inhale 2 puffs into the lungs daily.  Dispense: 4 g; Refill: 3 - predniSONE (STERAPRED UNI-PAK 21 TAB) 10 MG (21) TBPK tablet; 6 day taper - take by  mouth as directed for 6 days  Dispense: 21 tablet; Refill: 0  2. Muscle cramps Check labs, including connective tissue panel, magnesium, and phosphorus.  3. Essential hypertension Continue BP medication as prescribed   4. Midline low back pain without sciatica, unspecified chronicity Prednisone taper to help with low back pain. Connective tissue panel labs ordered for further evaluation   General Counseling: [place] verbalizes understanding of the findings of todays visit and agrees with plan of treatment. I have discussed any further diagnostic evaluation that may be needed or ordered today. We also reviewed her medications today. she has been encouraged to call the office with any questions or concerns that should arise related to todays visit.  Hypertension Counseling:   The following hypertensive lifestyle modification were recommended and discussed:  1. Limiting alcohol intake to less than 1 oz/day of ethanol:(24 oz of beer or 8 oz of wine or 2 oz of 100-proof whiskey). 2. Take baby ASA 81 mg daily. 3. Importance of regular aerobic exercise and losing weight. 4. Reduce dietary saturated fat and cholesterol intake for overall cardiovascular health. 5. Maintaining adequate dietary potassium, calcium, and magnesium intake. 6. Regular monitoring of the blood pressure. 7. Reduce sodium intake to less than 100 mmol/day (less than 2.3 gm of sodium or less than 6 gm of sodium choride)   This patient was seen by [place] with Dr Timoteo Gaul  Humphrey Rolls as a part of collaborative care agreement  Orders Placed This Encounter  Procedures  . POCT HgB A1C  . Pulmonary function test    Meds ordered this encounter  Medications  . Tiotropium Bromide Monohydrate (SPIRIVA RESPIMAT) 1.25 MCG/ACT AERS    Sig: Inhale 2 puffs into the lungs daily.    Dispense:  4 g    Refill:  3    Order Specific Question:   Supervising Provider    Answer:   Lavera Guise [id number]  . predniSONE  (STERAPRED UNI-PAK 21 TAB) 10 MG (21) TBPK tablet    Sig: 6 day taper - take by mouth as directed for 6 days    Dispense:  21 tablet    Refill:  0    Order Specific Question:   Supervising Provider    Answer:   Lavera Guise [id number]    Total Time spent: 49 Minutes      Dr Lavera Guise Internal medicine

## 2019-04-02 DIAGNOSIS — R252 Cramp and spasm: Secondary | ICD-10-CM | POA: Insufficient documentation

## 2019-04-02 DIAGNOSIS — E1165 Type 2 diabetes mellitus with hyperglycemia: Secondary | ICD-10-CM | POA: Insufficient documentation

## 2019-04-03 ENCOUNTER — Other Ambulatory Visit: Payer: Self-pay | Admitting: Nurse Practitioner

## 2019-04-03 DIAGNOSIS — R252 Cramp and spasm: Secondary | ICD-10-CM | POA: Diagnosis not present

## 2019-04-03 DIAGNOSIS — M06049 Rheumatoid arthritis without rheumatoid factor, unspecified hand: Secondary | ICD-10-CM | POA: Diagnosis not present

## 2019-04-04 ENCOUNTER — Other Ambulatory Visit: Payer: Self-pay | Admitting: Adult Health

## 2019-04-04 DIAGNOSIS — J4541 Moderate persistent asthma with (acute) exacerbation: Secondary | ICD-10-CM

## 2019-04-04 LAB — CBC
Hematocrit: 42.6 % (ref 34.0–46.6)
Hemoglobin: 14.2 g/dL (ref 11.1–15.9)
MCH: 30.5 pg (ref 26.6–33.0)
MCHC: 33.3 g/dL (ref 31.5–35.7)
MCV: 91 fL (ref 79–97)
Platelets: 233 10*3/uL (ref 150–450)
RBC: 4.66 x10E6/uL (ref 3.77–5.28)
RDW: 12.4 % (ref 11.7–15.4)
WBC: 9.8 10*3/uL (ref 3.4–10.8)

## 2019-04-04 LAB — RHEUMATOID FACTOR: Rheumatoid fact SerPl-aCnc: <10 IU/mL (ref 0.0–13.9)

## 2019-04-04 LAB — BASIC METABOLIC PANEL
BUN/Creatinine Ratio: 17 (ref 12–28)
BUN: 13 mg/dL (ref 8–27)
CO2: 26 mmol/L (ref 20–29)
Calcium: 9.3 mg/dL (ref 8.7–10.3)
Chloride: 100 mmol/L (ref 96–106)
Creatinine, Ser: 0.77 mg/dL (ref 0.57–1.00)
GFR calc Af Amer: 95 mL/min/{1.73_m2} (ref >59)
GFR calc non Af Amer: 82 mL/min/{1.73_m2} (ref >59)
Glucose: 112 mg/dL — ABNORMAL HIGH (ref 65–99)
Potassium: 3.8 mmol/L (ref 3.5–5.2)
Sodium: 140 mmol/L (ref 134–144)

## 2019-04-04 LAB — MAGNESIUM: Magnesium: 1.8 mg/dL (ref 1.6–2.3)

## 2019-04-04 LAB — ANA W/REFLEX IF POSITIVE: Anti Nuclear Antibody (ANA): NEGATIVE

## 2019-04-04 LAB — URIC ACID: Uric Acid: 3.5 mg/dL (ref 3.0–7.2)

## 2019-04-04 LAB — PHOSPHORUS: Phosphorus: 3.3 mg/dL (ref 3.0–4.3)

## 2019-04-04 LAB — SEDIMENTATION RATE: Sed Rate: 3 mm/hr (ref 0–40)

## 2019-04-05 NOTE — Progress Notes (Signed)
Labs good. Discuss at visit 04/28/2019

## 2019-04-10 ENCOUNTER — Other Ambulatory Visit: Payer: Self-pay

## 2019-04-10 MED ORDER — ROPINIROLE HCL 0.5 MG PO TABS: 0.5000 mg | ORAL_TABLET | Freq: Two times a day (BID) | ORAL | 0 refills | Status: DC

## 2019-04-12 DIAGNOSIS — Z79899 Other long term (current) drug therapy: Secondary | ICD-10-CM | POA: Diagnosis not present

## 2019-04-17 ENCOUNTER — Other Ambulatory Visit: Payer: Self-pay

## 2019-04-17 DIAGNOSIS — G63 Polyneuropathy in diseases classified elsewhere: Secondary | ICD-10-CM

## 2019-04-17 MED ORDER — GABAPENTIN 600 MG PO TABS: 600.0000 mg | ORAL_TABLET | Freq: Two times a day (BID) | ORAL | 3 refills | Status: DC

## 2019-04-26 ENCOUNTER — Telehealth: Payer: Self-pay

## 2019-04-26 NOTE — Telephone Encounter (Signed)
CONFIRMED 04-28-19 OV AS VIRTUAL. 

## 2019-04-28 ENCOUNTER — Encounter: Payer: Self-pay | Admitting: Nurse Practitioner

## 2019-04-28 ENCOUNTER — Ambulatory Visit (INDEPENDENT_AMBULATORY_CARE_PROVIDER_SITE_OTHER): Payer: Medicare Other | Admitting: Nurse Practitioner

## 2019-04-28 VITALS — Temp 98.3°F | Ht 63.0 in | Wt 173.0 lb

## 2019-04-28 DIAGNOSIS — I10 Essential (primary) hypertension: Secondary | ICD-10-CM

## 2019-04-28 DIAGNOSIS — R252 Cramp and spasm: Secondary | ICD-10-CM

## 2019-04-28 DIAGNOSIS — J45909 Unspecified asthma, uncomplicated: Secondary | ICD-10-CM

## 2019-04-28 DIAGNOSIS — R519 Headache, unspecified: Secondary | ICD-10-CM

## 2019-04-28 DIAGNOSIS — G2581 Restless legs syndrome: Secondary | ICD-10-CM | POA: Diagnosis not present

## 2019-04-28 MED ORDER — DILTIAZEM HCL ER 180 MG PO CP24: 180.0000 mg | ORAL_CAPSULE | Freq: Every day | ORAL | 1 refills | Status: DC

## 2019-04-28 MED ORDER — ROPINIROLE HCL 0.5 MG PO TABS: ORAL_TABLET | ORAL | 0 refills | Status: DC

## 2019-04-28 MED ORDER — UBRELVY 50 MG PO TABS: 1.0000 | ORAL_TABLET | ORAL | 2 refills | Status: DC | PRN

## 2019-04-28 NOTE — Progress Notes (Signed)
[facility] [place], [place] [address]  Internal MEDICINE  Telephone Visit  Patient Name: Summer Murphy  [id number]  [id number]  Date of Service: 04/30/2019  I connected with the patient at 1:11pm by telephone and verified the patients identity using two identifiers.   I discussed the limitations, risks, security and privacy concerns of performing an evaluation and management service by telephone and the availability of in person appointments. I also discussed with the patient that there may be a patient responsible charge related to the service.  The patient expressed understanding and agrees to proceed.    Chief Complaint  Patient presents with  . Telephone Assessment  . Telephone Screen  . Depression  . Diabetes  . Gastroesophageal Reflux  . Hypertension  . Headache    having real bad headaches everyday    The patient has been contacted via telephone for follow up visit due to concerns for spread of novel coronavirus. She was started on symbicort inhaler twice daily and takes spiriva daily. She states that she has been doing well with these medications and breathing has improved a great deal. She did have labs done since she was last seen. Connective tissue panel was negative. Only abnormal lab was elevated blood sugars. Most recent HgbA1c was 5.9 ad most recent blood sugar was much improved from her last check 9 months prior. She states that her back pain and joint pain is improved since she was last seen.       Current Medication: Outpatient Encounter Medications as of 04/28/2019  Medication Sig Note  . ALPRAZolam (XANAX) 0.25 MG tablet Take 0.25 mg by mouth 2 (two) times daily as needed for anxiety.   Marland Kitchen azelastine (ASTELIN) 0.1 % nasal spray once as needed.   . Brexpiprazole (REXULTI) 4 MG TABS Take 4 mg by mouth at bedtime.    . budesonide-formoterol (SYMBICORT) 160-4.5 MCG/ACT inhaler Inhale 2 puffs into the lungs 2 (two) times daily as needed  (for respiratory issues.).   Marland Kitchen busPIRone (BUSPAR) 10 MG tablet Take 10 mg by mouth 3 (three) times daily.   . COMBIVENT RESPIMAT 20-100 MCG/ACT AERS respimat INHALE 1 PUFF INTO THE LUNGS EVERY 6 HOURS   . cyclobenzaprine (FLEXERIL) 10 MG tablet Take 1 tablet (10 mg total) by mouth 2 (two) times daily as needed for muscle spasms.   . diclofenac sodium (VOLTAREN) 1 % GEL Apply 2 g topically 2 (two) times daily as needed (for pain).    Marland Kitchen diltiazem (DILACOR XR) 180 MG 24 hr capsule Take 1 capsule (180 mg total) by mouth daily.   . diphenhydrAMINE (BENADRYL) 25 MG tablet Take 50 mg by mouth 2 (two) times daily as needed.    . DULoxetine (CYMBALTA) 30 MG capsule Take 30 mg by mouth at bedtime. 07/22/2016: TOTAL DOSE=90 MG  . estrogens, conjugated, (PREMARIN) 0.45 MG tablet Take 0.45 mg by mouth daily.   Marland Kitchen gabapentin (NEURONTIN) 600 MG tablet Take 1 tablet (600 mg total) by mouth 2 (two) times daily.   . hydrOXYzine (ATARAX/VISTARIL) 10 MG tablet Take 1 tablet (10 mg total) by mouth 3 (three) times daily as needed.   Marland Kitchen ipratropium-albuterol (DUONEB) 0.5-2.5 (3) MG/3ML SOLN Take 3 mLs by nebulization every 4 (four) hours as needed (for shortness of breath/wheezing).   Marland Kitchen levocetirizine (XYZAL) 5 MG tablet Take 1 tablet (5 mg total) by mouth daily.   . meclizine (ANTIVERT) 12.5 MG tablet Take 1 tablet (12.5 mg total) by mouth 2 (two)  times daily as needed for dizziness.   . montelukast (SINGULAIR) 10 MG tablet Take 1 tablet (10 mg total) by mouth at bedtime.   Marland Kitchen nystatin (MYCOSTATIN) 100000 UNIT/ML suspension Take 5 mLs (500,000 Units total) by mouth 3 (three) times daily.   Marland Kitchen omeprazole (PRILOSEC) 40 MG capsule Take 1 capsule (40 mg total) by mouth daily.   Marland Kitchen oxybutynin (DITROPAN) 5 MG tablet TAKE 1 TABLET(5 MG) BY MOUTH TWICE DAILY   . predniSONE (STERAPRED UNI-PAK 21 TAB) 10 MG (21) TBPK tablet 6 day taper - take by mouth as directed for 6 days   . rOPINIRole (REQUIP) 0.5 MG tablet Take two tablets po BID  for restless legs.   . SUMAtriptan (IMITREX) 100 MG tablet Take 1 tablet (100 mg total) by mouth every 2 (two) hours as needed (for migraine headaches.). May repeat in 1 hours if headache persists or recurs.   . tamsulosin (FLOMAX) 0.4 MG CAPS capsule Take 0.4 mg by mouth daily.    . Tiotropium Bromide Monohydrate (SPIRIVA RESPIMAT) 1.25 MCG/ACT AERS Inhale 2 puffs into the lungs daily.   . traZODone (DESYREL) 50 MG tablet Take 50 mg by mouth at bedtime.   . valACYclovir (VALTREX) 500 MG tablet Take 500 mg by mouth daily as needed (for fever blisters).   . [DISCONTINUED] diltiazem (DILACOR XR) 180 MG 24 hr capsule Take 180 mg by mouth daily.   . [DISCONTINUED] rOPINIRole (REQUIP) 0.5 MG tablet Take 1 tablet (0.5 mg total) by mouth 2 (two) times daily. IF NEEDED FOR RESTLESS LEGS--NORMALLY JUST AT BEDTIME   . Ubrogepant (UBRELVY) 50 MG TABS Take 1 tablet by mouth as needed.    No facility-administered encounter medications on file as of 04/28/2019.    Surgical History: Past Surgical History:  Procedure Laterality Date  . ABDOMINAL HYSTERECTOMY    . AMPUTATION TOE Left 07/31/2016   Procedure: AMPUTATION TOE/MPJ 2nd toe;  Surgeon: Sharlotte Alamo, DPM;  Location: [facility] ORS;  Service: Podiatry;  Laterality: Left;  . APPENDECTOMY  1990  . BACK SURGERY    . BREAST SURGERY     bilateral breast reduction  . CARDIAC ELECTROPHYSIOLOGY STUDY AND ABLATION    . CHOLECYSTECTOMY  1990  . COLONOSCOPY WITH PROPOFOL N/A 01/04/2017   Procedure: COLONOSCOPY WITH PROPOFOL;  Surgeon: Manya Silvas, MD;  Location: [facility] ENDOSCOPY;  Service: Endoscopy;  Laterality: N/A;  . CORNEAL TRANSPLANT    . ESOPHAGOGASTRODUODENOSCOPY (EGD) WITH PROPOFOL N/A 01/04/2017   Procedure: ESOPHAGOGASTRODUODENOSCOPY (EGD) WITH PROPOFOL;  Surgeon: Manya Silvas, MD;  Location: [facility] ENDOSCOPY;  Service: Endoscopy;  Laterality: N/A;  . EXCISION BONE CYST Left 07/31/2016   Procedure: EXCISION BONE CYST/exostectomy 28124/left 2nd;   Surgeon: Sharlotte Alamo, DPM;  Location: [facility] ORS;  Service: Podiatry;  Laterality: Left;  . EXTRACORPOREAL SHOCK WAVE LITHOTRIPSY Left 09/12/2015   Procedure: EXTRACORPOREAL SHOCK WAVE LITHOTRIPSY (ESWL);  Surgeon: Hollice Espy, MD;  Location: [facility] ORS;  Service: Urology;  Laterality: Left;  . FRACTURE SURGERY     left foot  . HH repair    . JOINT REPLACEMENT Bilateral 2013,2014   total knees  . LAPAROSCOPIC HYSTERECTOMY    . LITHOTRIPSY    . TONSILLECTOMY    . URETEROSCOPY      Medical History: Past Medical History:  Diagnosis Date  . Acid reflux   . Anxiety   . Arrhythmia    treated with meds and has no current problems  . Arthritis    most uncomfortable in knees  . Asthma  uses several inhalers  . Depression   . Diabetes mellitus without complication (HCC)    sugars run low, not high sugars  . Fever blister   . Hematuria   . Hypertension   . Kidney stone    stones, 2nd lithotripsy  . Left flank pain   . Migraine   . Restless leg   . Yeast vaginitis     Family History: Family History  Problem Relation Age of Onset  . Prostate cancer Father   . Kidney Stones Father   . Kidney disease Neg Hx     Social History   Socioeconomic History  . Marital status: Divorced    Spouse name: Not on file  . Number of children: Not on file  . Years of education: Not on file  . Highest education level: Not on file  Occupational History  . Not on file  Tobacco Use  . Smoking status: Never Smoker  . Smokeless tobacco: Never Used  Substance and Sexual Activity  . Alcohol use: No  . Drug use: No  . Sexual activity: Not on file  Other Topics Concern  . Not on file  Social History Narrative  . Not on file   Social Determinants of Health   Financial Resource Strain:   . Difficulty of Paying Living Expenses: Not on file  Food Insecurity:   . Worried About Charity fundraiser in the Last Year: Not on file  . Ran Out of Food in the Last Year: Not on file   Transportation Needs:   . Lack of Transportation (Medical): Not on file  . Lack of Transportation (Non-Medical): Not on file  Physical Activity:   . Days of Exercise per Week: Not on file  . Minutes of Exercise per Session: Not on file  Stress:   . Feeling of Stress : Not on file  Social Connections:   . Frequency of Communication with Friends and Family: Not on file  . Frequency of Social Gatherings with Friends and Family: Not on file  . Attends Religious Services: Not on file  . Active Member of Clubs or Organizations: Not on file  . Attends Archivist Meetings: Not on file  . Marital Status: Not on file  Intimate Partner Violence:   . Fear of Current or Ex-Partner: Not on file  . Emotionally Abused: Not on file  . Physically Abused: Not on file  . Sexually Abused: Not on file      Review of Systems  Constitutional: Positive for fatigue. Negative for chills and fever.       Improved fatigue since her last visit .  HENT: Negative for congestion, sinus pressure, sinus pain and sore throat.   Respiratory: Positive for shortness of breath and wheezing.        Improved since she has restarted using her symbicort twice daily and spiriva every day. She is scheduled for upcoming pulmonary function test.   Cardiovascular: Negative for chest pain and palpitations.  Gastrointestinal: Negative for constipation, diarrhea and vomiting.  Endocrine: Negative for cold intolerance, heat intolerance, polydipsia and polyuria.  Musculoskeletal: Positive for arthralgias and back pain.       Improved back and joint pain.   Allergic/Immunologic: Positive for environmental allergies and food allergies.  Neurological: Positive for headaches. Negative for dizziness and light-headedness.  Hematological: Negative for adenopathy.  Psychiatric/Behavioral: Positive for dysphoric mood. The patient is hyperactive.    Today's Vitals   04/28/19 1051  Temp: 98.3 F (36.8 C)  Weight: 173 lb  (78.5 kg)  Height: 5\' 3"  (1.6 m)   Body mass index is 30.65 kg/m.  Observation/Objective:   The patient is alert and oriented. She is pleasant and answers all questions appropriately. Breathing is non-labored. She is in no acute distress at this time.    Assessment/Plan: 1. Moderate asthma without complication, unspecified whether persistent Improved since she has restarted using symbicort twice daily and spiriva daily. Use rescue inhalers and nebulizer treatments as needed and as prescribed. Pulmonary function test as scheduled.   2. Muscle cramps Improved. Normal labs. Will continue to monitor.   3. Essential hypertension Stable. Continue diltiazem XR 180mg  daily. Refills provided today.  - diltiazem (DILACOR XR) 180 MG 24 hr capsule; Take 1 capsule (180 mg total) by mouth daily.  Dispense: 90 capsule; Refill: 1  4. Restless leg syndrome Increase dose of ropinirole 0.5mg  up to twice daily. New prescription sent to her pharmacy.  - rOPINIRole (REQUIP) 0.5 MG tablet; Take two tablets po BID for restless legs.  Dispense: 180 tablet; Refill: 0  5. Nonintractable headache, unspecified chronicity pattern, unspecified headache type Samples ubrelvy 50mg  given to patient. Should take for acute headache. May repeat dose in two hours if symptoms are persistent.   General Counseling: Kathrene verbalizes understanding of the findings of today's phone visit and agrees with plan of treatment. I have discussed any further diagnostic evaluation that may be needed or ordered today. We also reviewed her medications today. she has been encouraged to call the office with any questions or concerns that should arise related to todays visit.   This patient was seen by [place] with Dr Lavera Guise as a part of collaborative care agreement  Meds ordered this encounter  Medications  . rOPINIRole (REQUIP) 0.5 MG tablet    Sig: Take two tablets po BID for restless legs.     Dispense:  180 tablet    Refill:  0    Just updating medication.    Order Specific Question:   Supervising Provider    Answer:   Lavera Guise [id number]  . diltiazem (DILACOR XR) 180 MG 24 hr capsule    Sig: Take 1 capsule (180 mg total) by mouth daily.    Dispense:  90 capsule    Refill:  1    Order Specific Question:   Supervising Provider    Answer:   Lavera Guise [id number]  . Ubrogepant (UBRELVY) 50 MG TABS    Sig: Take 1 tablet by mouth as needed.    Dispense:  10 tablet    Refill:  2    Samples provided    Order Specific Question:   Supervising Provider    Answer:   Lavera Guise [[id number]]    Time spent: 30Minutes    Dr Lavera Guise Internal medicine

## 2019-04-30 DIAGNOSIS — G2581 Restless legs syndrome: Secondary | ICD-10-CM | POA: Insufficient documentation

## 2019-05-01 ENCOUNTER — Telehealth: Payer: Self-pay

## 2019-05-01 ENCOUNTER — Other Ambulatory Visit: Payer: Self-pay | Admitting: Nurse Practitioner

## 2019-05-01 DIAGNOSIS — R197 Diarrhea, unspecified: Secondary | ICD-10-CM

## 2019-05-01 DIAGNOSIS — R112 Nausea with vomiting, unspecified: Secondary | ICD-10-CM

## 2019-05-01 MED ORDER — DIPHENOXYLATE-ATROPINE 2.5-0.025 MG PO TABS: 1.0000 | ORAL_TABLET | Freq: Three times a day (TID) | ORAL | 0 refills | Status: DC | PRN

## 2019-05-01 MED ORDER — ONDANSETRON HCL 4 MG PO TABS: 4.0000 mg | ORAL_TABLET | Freq: Three times a day (TID) | ORAL | 0 refills | Status: DC | PRN

## 2019-05-01 NOTE — Telephone Encounter (Signed)
Sent prescription for zofran 4mg  which can be taken up to three times daily as needed for nausea. Also sent lomotil which is three times daily if needed for diarrhea. Both are only as needed. She should limit oral intake to bland foods, bananas, rice, applesauce, and toast, and advance as tolerated. Drink plenty of fluid.

## 2019-05-01 NOTE — Telephone Encounter (Signed)
Confirmed appointment with patient and screened for covid. klh 

## 2019-05-01 NOTE — Telephone Encounter (Signed)
PT ADVISED WE SEND MED AND SEE REMINDER

## 2019-05-01 NOTE — Progress Notes (Signed)
Sent prescription for zofran 4mg  which can be taken up to three times daily as needed for nausea. Also sent lomotil which is three times daily if needed for diarrhea. Both are only as needed. She should limit oral intake to bland foods, bananas, rice, applesauce, and toast, and advance as tolerated. Drink plenty of fluid.

## 2019-05-03 ENCOUNTER — Ambulatory Visit: Payer: Medicare Other | Admitting: Internal Medicine

## 2019-05-08 ENCOUNTER — Telehealth: Payer: Self-pay

## 2019-05-08 ENCOUNTER — Other Ambulatory Visit: Payer: Self-pay | Admitting: Adult Health

## 2019-05-08 DIAGNOSIS — R32 Unspecified urinary incontinence: Secondary | ICD-10-CM

## 2019-05-08 DIAGNOSIS — I10 Essential (primary) hypertension: Secondary | ICD-10-CM

## 2019-05-08 NOTE — Telephone Encounter (Signed)
Pt was advised. Pt states that she is feeling fine today, but if she starts having diarrhea and nausea she will go to er.

## 2019-05-08 NOTE — Telephone Encounter (Signed)
If those medications are not working, she is likely developing some dehydration, She may need to be seen in ER for fluids and scans of the belly.

## 2019-05-15 ENCOUNTER — Telehealth: Payer: Self-pay

## 2019-05-15 NOTE — Telephone Encounter (Signed)
Screened and confirmed patient for PFT. Beth

## 2019-05-16 ENCOUNTER — Ambulatory Visit: Payer: Medicare Other | Admitting: Internal Medicine

## 2019-05-17 ENCOUNTER — Other Ambulatory Visit: Payer: Self-pay

## 2019-05-17 ENCOUNTER — Ambulatory Visit: Payer: Medicare Other | Admitting: Internal Medicine

## 2019-05-17 DIAGNOSIS — J4541 Moderate persistent asthma with (acute) exacerbation: Secondary | ICD-10-CM

## 2019-05-17 LAB — PULMONARY FUNCTION TEST

## 2019-05-20 NOTE — Procedures (Signed)
[place], [address]  DATE OF SERVICE: May 17, 2019  Complete Pulmonary Function Testing Interpretation:  FINDINGS:  Forced vital capacity is moderately decreased.  The FEV1 is 1.16 L which is 50% of predicted and is moderately decreased.  Postbronchodilator there is no significant change in the FEV1.  The FEV1 FEC ratio is mildly decreased.  Total lung capacity is normal residual volume is increased residual in total lung capacity ratio is increased FRC is increased.  DLCO not measurable  IMPRESSION:  This pulmonary function study is consistent with moderate obstructive lung disease.  No significant response to bronchodilators.  Allyne Gee, MD [facility] Pulmonary Critical Care Medicine Sleep Medicine

## 2019-05-21 NOTE — Progress Notes (Signed)
Patient has appointment with [facility] 06/01/2019

## 2019-05-30 ENCOUNTER — Telehealth: Payer: Self-pay

## 2019-05-30 ENCOUNTER — Other Ambulatory Visit: Payer: Self-pay | Admitting: Adult Health

## 2019-05-30 ENCOUNTER — Other Ambulatory Visit: Payer: Self-pay

## 2019-05-30 DIAGNOSIS — J449 Chronic obstructive pulmonary disease, unspecified: Secondary | ICD-10-CM

## 2019-05-30 DIAGNOSIS — R112 Nausea with vomiting, unspecified: Secondary | ICD-10-CM

## 2019-05-30 MED ORDER — ONDANSETRON HCL 4 MG PO TABS: 4.0000 mg | ORAL_TABLET | Freq: Three times a day (TID) | ORAL | 0 refills | Status: DC | PRN

## 2019-05-30 NOTE — Telephone Encounter (Signed)
Confirmed appointment on 06/01/2019 and screened for covid. klh 

## 2019-06-01 ENCOUNTER — Other Ambulatory Visit: Payer: Self-pay

## 2019-06-01 ENCOUNTER — Encounter: Payer: Self-pay | Admitting: Internal Medicine

## 2019-06-01 ENCOUNTER — Ambulatory Visit: Payer: Medicare Other | Admitting: Internal Medicine

## 2019-06-01 VITALS — BP 128/84 | HR 95 | Temp 97.4°F | Resp 16 | Ht 63.0 in | Wt 171.0 lb

## 2019-06-01 DIAGNOSIS — J45909 Unspecified asthma, uncomplicated: Secondary | ICD-10-CM | POA: Diagnosis not present

## 2019-06-01 DIAGNOSIS — J301 Allergic rhinitis due to pollen: Secondary | ICD-10-CM | POA: Diagnosis not present

## 2019-06-01 DIAGNOSIS — K219 Gastro-esophageal reflux disease without esophagitis: Secondary | ICD-10-CM | POA: Diagnosis not present

## 2019-06-01 MED ORDER — CETIRIZINE HCL 10 MG PO TABS: 10.0000 mg | ORAL_TABLET | Freq: Every day | ORAL | 5 refills | Status: DC

## 2019-06-01 NOTE — Progress Notes (Signed)
[facility],  [address]  Pulmonary Sleep Medicine   Office Visit Note  Patient Name: Summer Murphy DOB: Dec 30, 1955 MRN [id number]  Date of Service: 06/01/2019  Complaints/HPI: Patient is here for routine pulmonary follow-up. Her latest PFT is slightly improved from last year. FEV1 is 1.16, moderately decreased, findings consistent with moderate obstructive lung disease. Currently using Spiriva and duo-nebs. Complains of throat congestion, denies sinus pressure or increased shortness of breath. Has seasonal allergies and has been taking singulair for many years.  ROS  General: (-) fever, (-) chills, (-) night sweats, (-) weakness Skin: (-) rashes, (-) itching,. Eyes: (-) visual changes, (-) redness, (-) itching. Nose and Sinuses: (-) nasal stuffiness or itchiness, (-) postnasal drip, (-) nosebleeds, (-) sinus trouble. Mouth and Throat: (-) sore throat, (-) hoarseness. Neck: (-) swollen glands, (-) enlarged thyroid, (-) neck pain. Respiratory: - cough, (-) bloody sputum, - shortness of breath, - wheezing. Cardiovascular: - ankle swelling, (-) chest pain. Lymphatic: (-) lymph node enlargement. Neurologic: (-) numbness, (-) tingling. Psychiatric: (-) anxiety, (-) depression   Current Medication: Outpatient Encounter Medications as of 06/01/2019  Medication Sig Note  . ALPRAZolam (XANAX) 0.25 MG tablet Take 0.25 mg by mouth 2 (two) times daily as needed for anxiety.   Marland Kitchen azelastine (ASTELIN) 0.1 % nasal spray once as needed.   . Brexpiprazole (REXULTI) 4 MG TABS Take 4 mg by mouth at bedtime.    . budesonide-formoterol (SYMBICORT) 160-4.5 MCG/ACT inhaler INHALE 2 PUFFS BY MOUTH TWICE DAILY AS NEEDED FOR RESPIRATORY ISSUES   . busPIRone (BUSPAR) 10 MG tablet Take 10 mg by mouth 3 (three) times daily.   . COMBIVENT RESPIMAT 20-100 MCG/ACT AERS respimat INHALE 1 PUFF INTO THE LUNGS EVERY 6 HOURS   . cyclobenzaprine (FLEXERIL) 10 MG tablet Take  1 tablet (10 mg total) by mouth 2 (two) times daily as needed for muscle spasms.   . diclofenac sodium (VOLTAREN) 1 % GEL Apply 2 g topically 2 (two) times daily as needed (for pain).    Marland Kitchen diltiazem (DILACOR XR) 180 MG 24 hr capsule Take 1 capsule (180 mg total) by mouth daily.   . diphenhydrAMINE (BENADRYL) 25 MG tablet Take 50 mg by mouth 2 (two) times daily as needed.    . diphenoxylate-atropine (LOMOTIL) 2.5-0.025 MG tablet Take 1 tablet by mouth 3 (three) times daily as needed for diarrhea or loose stools.   . DULoxetine (CYMBALTA) 30 MG capsule Take 30 mg by mouth at bedtime. 07/22/2016: TOTAL DOSE=90 MG  . estrogens, conjugated, (PREMARIN) 0.45 MG tablet Take 0.45 mg by mouth daily.   Marland Kitchen gabapentin (NEURONTIN) 600 MG tablet Take 1 tablet (600 mg total) by mouth 2 (two) times daily.   . hydrOXYzine (ATARAX/VISTARIL) 10 MG tablet Take 1 tablet (10 mg total) by mouth 3 (three) times daily as needed.   Marland Kitchen ipratropium-albuterol (DUONEB) 0.5-2.5 (3) MG/3ML SOLN Take 3 mLs by nebulization every 4 (four) hours as needed (for shortness of breath/wheezing).   Marland Kitchen levocetirizine (XYZAL) 5 MG tablet Take 1 tablet (5 mg total) by mouth daily.   . meclizine (ANTIVERT) 12.5 MG tablet Take 1 tablet (12.5 mg total) by mouth 2 (two) times daily as needed for dizziness.   . montelukast (SINGULAIR) 10 MG tablet Take 1 tablet (10 mg total) by mouth at bedtime.   Marland Kitchen nystatin (MYCOSTATIN) 100000 UNIT/ML suspension Take 5 mLs (500,000 Units total) by mouth 3 (three) times daily.   Marland Kitchen omeprazole (PRILOSEC) 40 MG  capsule Take 1 capsule (40 mg total) by mouth daily.   . ondansetron (ZOFRAN) 4 MG tablet Take 1 tablet (4 mg total) by mouth every 8 (eight) hours as needed for nausea or vomiting.   Marland Kitchen oxybutynin (DITROPAN) 5 MG tablet TAKE 1 TABLET(5 MG) BY MOUTH TWICE DAILY   . predniSONE (STERAPRED UNI-PAK 21 TAB) 10 MG (21) TBPK tablet 6 day taper - take by mouth as directed for 6 days   . rOPINIRole (REQUIP) 0.5 MG tablet Take  two tablets po BID for restless legs.   . SUMAtriptan (IMITREX) 100 MG tablet Take 1 tablet (100 mg total) by mouth every 2 (two) hours as needed (for migraine headaches.). May repeat in 1 hours if headache persists or recurs.   . tamsulosin (FLOMAX) 0.4 MG CAPS capsule Take 0.4 mg by mouth daily.    . Tiotropium Bromide Monohydrate (SPIRIVA RESPIMAT) 1.25 MCG/ACT AERS Inhale 2 puffs into the lungs daily.   . traZODone (DESYREL) 50 MG tablet Take 50 mg by mouth at bedtime.   Marland Kitchen Ubrogepant (UBRELVY) 50 MG TABS Take 1 tablet by mouth as needed.   . valACYclovir (VALTREX) 500 MG tablet Take 500 mg by mouth daily as needed (for fever blisters).    No facility-administered encounter medications on file as of 06/01/2019.    Surgical History: Past Surgical History:  Procedure Laterality Date  . ABDOMINAL HYSTERECTOMY    . AMPUTATION TOE Left 07/31/2016   Procedure: AMPUTATION TOE/MPJ 2nd toe;  Surgeon: Sharlotte Alamo, DPM;  Location: [facility] ORS;  Service: Podiatry;  Laterality: Left;  . APPENDECTOMY  1990  . BACK SURGERY    . BREAST SURGERY     bilateral breast reduction  . CARDIAC ELECTROPHYSIOLOGY STUDY AND ABLATION    . CHOLECYSTECTOMY  1990  . COLONOSCOPY WITH PROPOFOL N/A 01/04/2017   Procedure: COLONOSCOPY WITH PROPOFOL;  Surgeon: Manya Silvas, MD;  Location: [facility] ENDOSCOPY;  Service: Endoscopy;  Laterality: N/A;  . CORNEAL TRANSPLANT    . ESOPHAGOGASTRODUODENOSCOPY (EGD) WITH PROPOFOL N/A 01/04/2017   Procedure: ESOPHAGOGASTRODUODENOSCOPY (EGD) WITH PROPOFOL;  Surgeon: Manya Silvas, MD;  Location: [facility] ENDOSCOPY;  Service: Endoscopy;  Laterality: N/A;  . EXCISION BONE CYST Left 07/31/2016   Procedure: EXCISION BONE CYST/exostectomy 28124/left 2nd;  Surgeon: Sharlotte Alamo, DPM;  Location: [facility] ORS;  Service: Podiatry;  Laterality: Left;  . EXTRACORPOREAL SHOCK WAVE LITHOTRIPSY Left 09/12/2015   Procedure: EXTRACORPOREAL SHOCK WAVE LITHOTRIPSY (ESWL);  Surgeon: Hollice Espy, MD;   Location: [facility] ORS;  Service: Urology;  Laterality: Left;  . FRACTURE SURGERY     left foot  . HH repair    . JOINT REPLACEMENT Bilateral 2013,2014   total knees  . LAPAROSCOPIC HYSTERECTOMY    . LITHOTRIPSY    . TONSILLECTOMY    . URETEROSCOPY      Medical History: Past Medical History:  Diagnosis Date  . Acid reflux   . Anxiety   . Arrhythmia    treated with meds and has no current problems  . Arthritis    most uncomfortable in knees  . Asthma    uses several inhalers  . Depression   . Diabetes mellitus without complication (HCC)    sugars run low, not high sugars  . Fever blister   . Hematuria   . Hypertension   . Kidney stone    stones, 2nd lithotripsy  . Left flank pain   . Migraine   . Restless leg   . Yeast vaginitis  Family History: Family History  Problem Relation Age of Onset  . Prostate cancer Father   . Kidney Stones Father   . Kidney disease Neg Hx     Social History: Social History   Socioeconomic History  . Marital status: Divorced    Spouse name: Not on file  . Number of children: Not on file  . Years of education: Not on file  . Highest education level: Not on file  Occupational History  . Not on file  Tobacco Use  . Smoking status: Never Smoker  . Smokeless tobacco: Never Used  Substance and Sexual Activity  . Alcohol use: No  . Drug use: No  . Sexual activity: Not on file  Other Topics Concern  . Not on file  Social History Narrative  . Not on file   Social Determinants of Health   Financial Resource Strain:   . Difficulty of Paying Living Expenses:   Food Insecurity:   . Worried About Charity fundraiser in the Last Year:   . Arboriculturist in the Last Year:   Transportation Needs:   . Film/video editor (Medical):   Marland Kitchen Lack of Transportation (Non-Medical):   Physical Activity:   . Days of Exercise per Week:   . Minutes of Exercise per Session:   Stress:   . Feeling of Stress :   Social Connections:   .  Frequency of Communication with Friends and Family:   . Frequency of Social Gatherings with Friends and Family:   . Attends Religious Services:   . Active Member of Clubs or Organizations:   . Attends Archivist Meetings:   Marland Kitchen Marital Status:   Intimate Partner Violence:   . Fear of Current or Ex-Partner:   . Emotionally Abused:   Marland Kitchen Physically Abused:   . Sexually Abused:     Vital Signs: Blood pressure 128/84, pulse 95, temperature (!) 97.4 F (36.3 C), resp. rate 16, height 5\' 3"  (1.6 m), weight 171 lb (77.6 kg), SpO2 97 %.  Examination: General Appearance: The patient is well-developed, well-nourished, and in no distress. Skin: Gross inspection of skin unremarkable. Head: normocephalic, no gross deformities. Eyes: no gross deformities noted. ENT: ears appear grossly normal no exudates. Neck: Supple. No thyromegaly. No LAD. Respiratory: Diminished inspiratory wheezing in bilateral upper lobes. Cardiovascular: Normal S1 and S2 without murmur or rub. Extremities: No cyanosis. pulses are equal. Neurologic: Alert and oriented. No involuntary movements.  LABS: Recent Results (from the past 2160 hour(s))  CBC     Status: None   Collection Time: 04/03/19 11:23 AM  Result Value Ref Range   WBC 9.8 3.4 - 10.8 x10E3/uL   RBC 4.66 3.77 - 5.28 x10E6/uL   Hemoglobin 14.2 11.1 - 15.9 g/dL   Hematocrit 42.6 34.0 - 46.6 %   MCV 91 79 - 97 fL   MCH 30.5 26.6 - 33.0 pg   MCHC 33.3 31.5 - 35.7 g/dL   RDW 12.4 11.7 - 15.4 %   Platelets 233 150 - 450 [id number]  Basic metabolic panel     Status: Abnormal   Collection Time: 04/03/19 11:23 AM  Result Value Ref Range   Glucose 112 (H) 65 - 99 mg/dL   BUN 13 8 - 27 mg/dL   Creatinine, Ser 0.77 0.57 - 1.00 mg/dL   GFR calc non Af Amer 82 >59 mL/min/1.73   GFR calc Af Amer 95 >59 mL/min/1.73   BUN/Creatinine Ratio 17 12 - 28  Sodium 140 134 - 144 mmol/L   Potassium 3.8 3.5 - 5.2 mmol/L   Chloride 100 96 - 106 mmol/L   CO2 26 20  - 29 mmol/L   Calcium 9.3 8.7 - 10.3 mg/dL  Rheumatoid factor     Status: None   Collection Time: 04/03/19 11:23 AM  Result Value Ref Range   Rhuematoid fact SerPl-aCnc <10.0 0.0 - 13.9 IU/mL  Uric acid     Status: None   Collection Time: 04/03/19 11:23 AM  Result Value Ref Range   Uric Acid 3.5 3.0 - 7.2 mg/dL    Comment:            Therapeutic target for gout patients: <6.0  Phosphorus     Status: None   Collection Time: 04/03/19 11:23 AM  Result Value Ref Range   Phosphorus 3.3 3.0 - 4.3 mg/dL  ANA w/Reflex if Positive     Status: None   Collection Time: 04/03/19 11:23 AM  Result Value Ref Range   Anti Nuclear Antibody (ANA) Negative Negative  Sedimentation rate     Status: None   Collection Time: 04/03/19 11:23 AM  Result Value Ref Range   Sed Rate 3 0 - 40 mm/hr  Magnesium     Status: None   Collection Time: 04/03/19 11:23 AM  Result Value Ref Range   Magnesium 1.8 1.6 - 2.3 mg/dL    Radiology: DG Chest 2 View  Result Date: 11/10/2018 CLINICAL DATA:  Cough, shortness of breath EXAM: CHEST - 2 VIEW COMPARISON:  09/28/2018 FINDINGS: Moderate-sized hiatal hernia. Heart is normal size. Lungs clear. No effusions. No acute bony abnormality. IMPRESSION: Moderate-sized hiatal hernia.  No active disease. Electronically Signed   By: Rolm Baptise M.D.   On: 11/10/2018 16:25    No results found.  No results found.    Assessment and Plan: Patient Active Problem List   Diagnosis Date Noted  . Restless leg syndrome 04/30/2019  . Type 2 diabetes mellitus with hyperglycemia ([place]) 04/02/2019  . Muscle cramps 04/02/2019  . Diastolic dysfunction [id number]  . SOB (shortness of breath) 12/14/2018  . Hiatal hernia with GERD 12/14/2018  . Wheezing 12/14/2018  . Impaired fasting glucose 12/14/2018  . COPD with acute exacerbation ([place]) 09/28/2018  . Acute upper respiratory infection 08/25/2018  . Recurrent sinusitis 08/15/2018  . Prediabetes 08/15/2018  . Nonintractable headache  08/15/2018  . Dysuria 08/15/2018  . Intractable migraine without status migrainosus 04/22/2018  . Nausea 04/22/2018  . Polyneuropathy associated with underlying disease ([place]) 04/22/2018  . Midline low back pain without sciatica 04/22/2018  . Routine cervical smear 04/22/2018  . Hypertension 07/01/2017  . Acute bronchitis with asthma 07/01/2017  . Allergic rhinitis 07/01/2017  . Moderate asthma without complication [id number]  . Severe recurrent major depression without psychotic features ([place]) 09/10/2014  . COPD (chronic obstructive pulmonary disease) ([place]) 09/10/2014  . Calculi, ureter 04/26/2013  . Corneal graft malfunction 08/17/2012  . Calculus of kidney 04/26/2012  . Renal colic [id number]  . Urge incontinence 04/26/2012  . Diaphragmatic hernia 10/27/2010  . Barrett esophagus 07/07/2010    1. Moderate asthma without complication, unspecified whether persistent Stable at this time on current inhaler therapy, continue to monitor PFT's and patient.  2. Seasonal allergic rhinitis due to pollen Complaints of throat congestion related to allergy, switch to zyrtec to see if improvement in symptoms occur. Continue to monitor. - cetirizine (ZYRTEC) 10 MG tablet; Take 1 tablet (10 mg total) by mouth daily.  Dispense: 30 tablet;  Refill: 5  3. Gastroesophageal reflux disease without esophagitis Stable on current therapy, continue to monitor.  General Counseling: I have discussed the findings of the evaluation and examination with Summer Murphy.  I have also discussed any further diagnostic evaluation thatmay be needed or ordered today. Summer Murphy verbalizes understanding of the findings of todays visit. We also reviewed her medications today and discussed drug interactions and side effects including but not limited excessive drowsiness and altered mental states. We also discussed that there is always a risk not just to her but also people around her. she has been encouraged to call the office with any  questions or concerns that should arise related to todays visit.  No orders of the defined types were placed in this encounter.    Time spent: 30 This patient was seen by Orson Gear AGNP-C in Collaboration with Dr Lavera Guise as a part of collaborative care agreement  I have personally obtained a history, examined the patient, evaluated laboratory and imaging results, formulated the assessment and plan and placed orders.    Allyne Gee, MD [facility] Pulmonary and Critical Care Sleep medicine

## 2019-06-22 DIAGNOSIS — K22710 Barrett's esophagus with low grade dysplasia: Secondary | ICD-10-CM | POA: Diagnosis not present

## 2019-06-22 DIAGNOSIS — Z86010 Personal history of colonic polyps: Secondary | ICD-10-CM | POA: Diagnosis not present

## 2019-06-22 DIAGNOSIS — K2100 Gastro-esophageal reflux disease with esophagitis, without bleeding: Secondary | ICD-10-CM | POA: Diagnosis not present

## 2019-06-22 DIAGNOSIS — Z87898 Personal history of other specified conditions: Secondary | ICD-10-CM | POA: Diagnosis not present

## 2019-06-27 DIAGNOSIS — K2100 Gastro-esophageal reflux disease with esophagitis, without bleeding: Secondary | ICD-10-CM | POA: Insufficient documentation

## 2019-07-03 DIAGNOSIS — Z79899 Other long term (current) drug therapy: Secondary | ICD-10-CM | POA: Diagnosis not present

## 2019-07-05 ENCOUNTER — Telehealth: Payer: Self-pay

## 2019-07-05 NOTE — Telephone Encounter (Signed)
Called pt and advised pt to continue to monitor and write bp readings and if readings continue to stay elevated then give [place] a call.

## 2019-07-05 NOTE — Telephone Encounter (Signed)
I think this is going to be fine. As long as she is monitoring her blood pressure at home. She should notify us if it continues to run elevated at home.

## 2019-07-25 ENCOUNTER — Other Ambulatory Visit: Payer: Self-pay

## 2019-07-25 DIAGNOSIS — G63 Polyneuropathy in diseases classified elsewhere: Secondary | ICD-10-CM

## 2019-07-25 DIAGNOSIS — J449 Chronic obstructive pulmonary disease, unspecified: Secondary | ICD-10-CM

## 2019-07-25 DIAGNOSIS — G2581 Restless legs syndrome: Secondary | ICD-10-CM

## 2019-07-25 MED ORDER — GABAPENTIN 600 MG PO TABS: 600.0000 mg | ORAL_TABLET | Freq: Two times a day (BID) | ORAL | 3 refills | Status: DC

## 2019-07-25 MED ORDER — ROPINIROLE HCL 0.5 MG PO TABS: ORAL_TABLET | ORAL | 3 refills | Status: DC

## 2019-07-25 MED ORDER — BUDESONIDE-FORMOTEROL FUMARATE 160-4.5 MCG/ACT IN AERO: INHALATION_SPRAY | RESPIRATORY_TRACT | 3 refills | Status: DC

## 2019-07-26 ENCOUNTER — Other Ambulatory Visit: Payer: Self-pay

## 2019-07-26 DIAGNOSIS — J4541 Moderate persistent asthma with (acute) exacerbation: Secondary | ICD-10-CM

## 2019-07-26 DIAGNOSIS — G2581 Restless legs syndrome: Secondary | ICD-10-CM

## 2019-07-26 MED ORDER — SPIRIVA RESPIMAT 1.25 MCG/ACT IN AERS: 2.0000 | INHALATION_SPRAY | Freq: Every day | RESPIRATORY_TRACT | 3 refills | Status: DC

## 2019-07-26 MED ORDER — ROPINIROLE HCL 0.5 MG PO TABS: ORAL_TABLET | ORAL | 3 refills | Status: DC

## 2019-07-29 ENCOUNTER — Other Ambulatory Visit: Payer: Self-pay | Admitting: Adult Health

## 2019-07-29 DIAGNOSIS — J4541 Moderate persistent asthma with (acute) exacerbation: Secondary | ICD-10-CM

## 2019-07-31 ENCOUNTER — Other Ambulatory Visit: Payer: Self-pay

## 2019-07-31 DIAGNOSIS — G2581 Restless legs syndrome: Secondary | ICD-10-CM

## 2019-07-31 MED ORDER — ROPINIROLE HCL 0.5 MG PO TABS: ORAL_TABLET | ORAL | 3 refills | Status: DC

## 2019-08-01 ENCOUNTER — Ambulatory Visit: Payer: Medicare Other | Admitting: Nurse Practitioner

## 2019-08-03 ENCOUNTER — Telehealth: Payer: Self-pay

## 2019-08-03 NOTE — Telephone Encounter (Signed)
Confirmed and screened for 08-08-19 ov.

## 2019-08-08 ENCOUNTER — Other Ambulatory Visit: Payer: Self-pay

## 2019-08-08 ENCOUNTER — Ambulatory Visit (INDEPENDENT_AMBULATORY_CARE_PROVIDER_SITE_OTHER): Payer: Medicare Other | Admitting: Nurse Practitioner

## 2019-08-08 ENCOUNTER — Encounter: Payer: Self-pay | Admitting: Nurse Practitioner

## 2019-08-08 VITALS — BP 128/75 | HR 95 | Temp 97.8°F | Resp 16 | Ht 63.0 in | Wt 171.1 lb

## 2019-08-08 DIAGNOSIS — B373 Candidiasis of vulva and vagina: Secondary | ICD-10-CM | POA: Diagnosis not present

## 2019-08-08 DIAGNOSIS — L84 Corns and callosities: Secondary | ICD-10-CM | POA: Diagnosis not present

## 2019-08-08 DIAGNOSIS — E1165 Type 2 diabetes mellitus with hyperglycemia: Secondary | ICD-10-CM | POA: Diagnosis not present

## 2019-08-08 DIAGNOSIS — J45909 Unspecified asthma, uncomplicated: Secondary | ICD-10-CM

## 2019-08-08 DIAGNOSIS — Z1231 Encounter for screening mammogram for malignant neoplasm of breast: Secondary | ICD-10-CM

## 2019-08-08 DIAGNOSIS — B3731 Acute candidiasis of vulva and vagina: Secondary | ICD-10-CM

## 2019-08-08 DIAGNOSIS — R300 Dysuria: Secondary | ICD-10-CM | POA: Diagnosis not present

## 2019-08-08 DIAGNOSIS — R29818 Other symptoms and signs involving the nervous system: Secondary | ICD-10-CM

## 2019-08-08 DIAGNOSIS — Z0001 Encounter for general adult medical examination with abnormal findings: Secondary | ICD-10-CM

## 2019-08-08 LAB — POCT GLYCOSYLATED HEMOGLOBIN (HGB A1C): Hemoglobin A1C: 5.9 % — AB (ref 4.0–5.6)

## 2019-08-08 MED ORDER — FLUCONAZOLE 150 MG PO TABS: ORAL_TABLET | ORAL | 1 refills | Status: DC

## 2019-08-08 MED ORDER — KETOCONAZOLE 2 % EX CREA: 1.0000 "application " | TOPICAL_CREAM | Freq: Every day | CUTANEOUS | 1 refills | Status: DC

## 2019-08-08 NOTE — Progress Notes (Signed)
[facility] [place], [place] [address]  Internal MEDICINE  Office Visit Note  Patient Name: Summer Murphy  [id number]  [id number]  Date of Service: 08/13/2019   Pt is here for routine health maintenance examination  Chief Complaint  Patient presents with  . Medicare Wellness  . Hypertension  . Gastroesophageal Reflux  . Diabetes  . Arthritis  . Shortness of Breath  . Cyst    on top of vagina, itches     The patient is here for health maintenance exam. Today, she states that she has rash on the vagina. She states that it is very itchy and just all over the area. She has not used any topical medications for this. Gradually getting worse.  The patient states that she has also been having issues with vertigo. States that this is worse when she looks up or lays down. Causes her to have some nausea as well. She is having balance issues due to this. She feels like she has to hold onto furniture or the wall when walking at home so she does not fall. She is frequently tired. She also reports having some increased trouble with memory.  She is due to have screening mammogram. She will be due to have her routine, fasting labs checked in 09/2019.  She has a callus on the inner aspect of the left foot. This is gradually getting larger. It is tender. She sttes that it is getting difficult for her to wear any shoes other than sandals or flip flops.     Current Medication: Outpatient Encounter Medications as of 08/08/2019  Medication Sig Note  . ALPRAZolam (XANAX) 0.25 MG tablet Take 0.25 mg by mouth 2 (two) times daily as needed for anxiety.   Marland Kitchen BIOTIN PO Take by mouth daily.   . Brexpiprazole (REXULTI) 4 MG TABS Take 4 mg by mouth at bedtime.    . budesonide-formoterol (SYMBICORT) 160-4.5 MCG/ACT inhaler INHALE 2 PUFFS BY MOUTH TWICE DAILY AS NEEDED FOR RESPIRATORY ISSUES   . busPIRone (BUSPAR) 10 MG tablet Take 10 mg by mouth 2 (two) times daily.    . cetirizine  (ZYRTEC) 10 MG tablet Take 1 tablet (10 mg total) by mouth daily.   . COMBIVENT RESPIMAT 20-100 MCG/ACT AERS respimat INHALE 1 PUFF INTO THE LUNGS EVERY 6 HOURS   . cyclobenzaprine (FLEXERIL) 10 MG tablet Take 1 tablet (10 mg total) by mouth 2 (two) times daily as needed for muscle spasms.   . diclofenac sodium (VOLTAREN) 1 % GEL Apply 2 g topically 2 (two) times daily as needed (for pain).    Marland Kitchen diltiazem (DILACOR XR) 180 MG 24 hr capsule Take 1 capsule (180 mg total) by mouth daily.   . diphenhydrAMINE (BENADRYL) 25 MG tablet Take 50 mg by mouth 2 (two) times daily as needed.    . diphenoxylate-atropine (LOMOTIL) 2.5-0.025 MG tablet Take 1 tablet by mouth 3 (three) times daily as needed for diarrhea or loose stools.   . famotidine (PEPCID) 40 MG tablet Take 40 mg by mouth at bedtime.   . gabapentin (NEURONTIN) 600 MG tablet Take 1 tablet (600 mg total) by mouth 2 (two) times daily.   Marland Kitchen ipratropium-albuterol (DUONEB) 0.5-2.5 (3) MG/3ML SOLN Take 3 mLs by nebulization every 4 (four) hours as needed (for shortness of breath/wheezing).   . LORazepam (ATIVAN) 1 MG tablet Take 1 mg by mouth at bedtime.   . meclizine (ANTIVERT) 12.5 MG tablet Take 1 tablet (12.5 mg total) by  mouth 2 (two) times daily as needed for dizziness.   . montelukast (SINGULAIR) 10 MG tablet TAKE 1 TABLET(10 MG) BY MOUTH AT BEDTIME   . nystatin (MYCOSTATIN) 100000 UNIT/ML suspension Take 5 mLs (500,000 Units total) by mouth 3 (three) times daily.   . ondansetron (ZOFRAN) 4 MG tablet Take 1 tablet (4 mg total) by mouth every 8 (eight) hours as needed for nausea or vomiting.   . pantoprazole (PROTONIX) 40 MG tablet Take 40 mg by mouth 2 (two) times daily before a meal.   . rOPINIRole (REQUIP) 0.5 MG tablet Take two tablets po BID for restless legs.   . SUMAtriptan (IMITREX) 100 MG tablet Take 1 tablet (100 mg total) by mouth every 2 (two) hours as needed (for migraine headaches.). May repeat in 1 hours if headache persists or recurs.    . Tiotropium Bromide Monohydrate (SPIRIVA RESPIMAT) 1.25 MCG/ACT AERS Inhale 2 puffs into the lungs daily.   . traZODone (DESYREL) 150 MG tablet Take 50 mg by mouth at bedtime.    . valACYclovir (VALTREX) 500 MG tablet Take 500 mg by mouth daily as needed (for fever blisters).   . fluconazole (DIFLUCAN) 150 MG tablet Take 1 tablet po every other day for 5 doses   . ketoconazole (NIZORAL) 2 % cream Apply 1 application topically daily.   . [DISCONTINUED] azelastine (ASTELIN) 0.1 % nasal spray once as needed.   . [DISCONTINUED] DULoxetine (CYMBALTA) 60 MG capsule Take 30 mg by mouth at bedtime.  07/22/2016: TOTAL DOSE=90 MG  . [DISCONTINUED] estrogens, conjugated, (PREMARIN) 0.45 MG tablet Take 0.45 mg by mouth daily.   . [DISCONTINUED] hydrOXYzine (ATARAX/VISTARIL) 10 MG tablet Take 1 tablet (10 mg total) by mouth 3 (three) times daily as needed. (Patient not taking: Reported on 08/08/2019)   . [DISCONTINUED] omeprazole (PRILOSEC) 40 MG capsule Take 1 capsule (40 mg total) by mouth daily. (Patient not taking: Reported on 08/08/2019)   . [DISCONTINUED] oxybutynin (DITROPAN) 5 MG tablet TAKE 1 TABLET(5 MG) BY MOUTH TWICE DAILY (Patient not taking: Reported on 08/08/2019)   . [DISCONTINUED] predniSONE (STERAPRED UNI-PAK 21 TAB) 10 MG (21) TBPK tablet 6 day taper - take by mouth as directed for 6 days (Patient not taking: Reported on 08/08/2019)   . [DISCONTINUED] tamsulosin (FLOMAX) 0.4 MG CAPS capsule Take 0.4 mg by mouth daily.    . [DISCONTINUED] Ubrogepant (UBRELVY) 50 MG TABS Take 1 tablet by mouth as needed. (Patient not taking: Reported on 08/08/2019)    No facility-administered encounter medications on file as of 08/08/2019.    Surgical History: Past Surgical History:  Procedure Laterality Date  . ABDOMINAL HYSTERECTOMY    . AMPUTATION TOE Left 07/31/2016   Procedure: AMPUTATION TOE/MPJ 2nd toe;  Surgeon: Sharlotte Alamo, DPM;  Location: [facility] ORS;  Service: Podiatry;  Laterality: Left;  .  APPENDECTOMY  1990  . BACK SURGERY    . BREAST SURGERY     bilateral breast reduction  . CARDIAC ELECTROPHYSIOLOGY STUDY AND ABLATION    . CHOLECYSTECTOMY  1990  . COLONOSCOPY WITH PROPOFOL N/A 01/04/2017   Procedure: COLONOSCOPY WITH PROPOFOL;  Surgeon: Manya Silvas, MD;  Location: [facility] ENDOSCOPY;  Service: Endoscopy;  Laterality: N/A;  . CORNEAL TRANSPLANT    . ESOPHAGOGASTRODUODENOSCOPY (EGD) WITH PROPOFOL N/A 01/04/2017   Procedure: ESOPHAGOGASTRODUODENOSCOPY (EGD) WITH PROPOFOL;  Surgeon: Manya Silvas, MD;  Location: [facility] ENDOSCOPY;  Service: Endoscopy;  Laterality: N/A;  . EXCISION BONE CYST Left 07/31/2016   Procedure: EXCISION BONE CYST/exostectomy 28124/left 2nd;  Surgeon: Sharlotte Alamo, DPM;  Location: [facility] ORS;  Service: Podiatry;  Laterality: Left;  . EXTRACORPOREAL SHOCK WAVE LITHOTRIPSY Left 09/12/2015   Procedure: EXTRACORPOREAL SHOCK WAVE LITHOTRIPSY (ESWL);  Surgeon: Hollice Espy, MD;  Location: [facility] ORS;  Service: Urology;  Laterality: Left;  . FRACTURE SURGERY     left foot  . HH repair    . JOINT REPLACEMENT Bilateral 2013,2014   total knees  . LAPAROSCOPIC HYSTERECTOMY    . LITHOTRIPSY    . TONSILLECTOMY    . URETEROSCOPY      Medical History: Past Medical History:  Diagnosis Date  . Acid reflux   . Anxiety   . Arrhythmia    treated with meds and has no current problems  . Arthritis    most uncomfortable in knees  . Asthma    uses several inhalers  . Depression   . Diabetes mellitus without complication (HCC)    sugars run low, not high sugars  . Fever blister   . Hematuria   . Hypertension   . Kidney stone    stones, 2nd lithotripsy  . Left flank pain   . Migraine   . Restless leg   . Yeast vaginitis     Family History: Family History  Problem Relation Age of Onset  . Prostate cancer Father   . Kidney Stones Father   . Kidney disease Neg Hx       Review of Systems  Constitutional: Positive for fatigue. Negative for chills and  fever.  HENT: Positive for congestion. Negative for sinus pressure, sinus pain and sore throat.   Respiratory: Positive for shortness of breath and wheezing.        Improved since she has restarted using her symbicort twice daily and spiriva every day. She is scheduled for upcoming pulmonary function test.   Cardiovascular: Negative for chest pain and palpitations.  Gastrointestinal: Negative for constipation, diarrhea and vomiting.  Endocrine: Negative for cold intolerance, heat intolerance, polydipsia and polyuria.  Genitourinary: Positive for vaginal discharge.       Vaginal itching and irritation.  Musculoskeletal: Positive for arthralgias, back pain and gait problem.       Callus formation on the medial aspect of the left foot, just below the great toe.   Allergic/Immunologic: Positive for environmental allergies and food allergies.  Neurological: Positive for dizziness and headaches. Negative for light-headedness.  Hematological: Negative for adenopathy.  Psychiatric/Behavioral: Positive for dysphoric mood. The patient is hyperactive.     Today's Vitals   08/08/19 1352  BP: 128/75  Pulse: 95  Resp: 16  Temp: 97.8 F (36.6 C)  SpO2: 94%  Weight: 171 lb 1.6 oz (77.6 kg)  Height: 5\' 3"  (1.6 m)   Body mass index is 30.31 kg/m.  Physical Exam Vitals (Appears jittery) and nursing note reviewed.  Constitutional:      Appearance: Normal appearance.     Comments: anxious  HENT:     Head: Normocephalic and atraumatic.     Right Ear: External ear normal.     Nose: Nose normal.  Eyes:     Pupils: Pupils are equal, round, and reactive to light.  Neck:     Vascular: No carotid bruit.  Cardiovascular:     Rate and Rhythm: Normal rate and regular rhythm.     Pulses: Normal pulses.          Dorsalis pedis pulses are 2+ on the right side and 2+ on the left side.  Posterior tibial pulses are 2+ on the right side and 2+ on the left side.     Heart sounds: Normal heart sounds.    Pulmonary:     Effort: Respiratory distress present.     Breath sounds: Examination of the right-upper field reveals wheezing. Examination of the left-upper field reveals wheezing. Examination of the right-middle field reveals wheezing. Examination of the left-middle field reveals wheezing. Examination of the right-lower field reveals wheezing. Examination of the left-lower field reveals wheezing. Wheezing present.     Comments: Stridor heard in upper airways.  Chest:     Breasts:        Right: Normal. No swelling, bleeding, inverted nipple, mass, nipple discharge, skin change or tenderness.        Left: Normal. No swelling, bleeding, inverted nipple, mass, nipple discharge, skin change or tenderness.  Abdominal:     General: Bowel sounds are normal.     Palpations: Abdomen is soft.     Tenderness: There is no abdominal tenderness.  Genitourinary:    Comments: Fungal type rash present on both sides of the groin. Red, warm to touch. Skin intact with no evidence of drainage.  Musculoskeletal:        General: Normal range of motion.     Cervical back: Normal range of motion and neck supple.     Right foot: Normal range of motion. No deformity or bunion.     Left foot: Normal range of motion. No deformity or bunion.       Feet:  Feet:     Right foot:     Protective Sensation: 10 sites tested. 10 sites sensed.     Skin integrity: Skin integrity normal.     Toenail Condition: Right toenails are normal.     Left foot:     Protective Sensation: 10 sites tested. 10 sites sensed.     Skin integrity: Callus present.     Toenail Condition: Left toenails are normal.  Lymphadenopathy:     Upper Body:     Right upper body: No axillary adenopathy.     Left upper body: No axillary adenopathy.  Skin:    General: Skin is warm and dry.     Capillary Refill: Capillary refill takes less than 2 seconds.  Neurological:     Mental Status: She is alert and oriented to person, place, and time. Mental  status is at baseline.     Cranial Nerves: No cranial nerve deficit.     Comments: Mild, generalized weakness present.   Psychiatric:        Attention and Perception: Attention and perception normal.        Mood and Affect: Mood is anxious.        Speech: Speech normal.        Behavior: Behavior normal. Behavior is cooperative.        Thought Content: Thought content normal.        Cognition and Memory: Cognition normal.        Judgment: Judgment normal.    Depression screen [facility] 2/9 08/08/2019 12/14/2018 09/28/2018 07/28/2018 05/25/2018  Decreased Interest 0 0 0 0 0  Down, Depressed, Hopeless 0 0 0 0 0  PHQ - 2 Score 0 0 0 0 0  Altered sleeping - - - - -  Tired, decreased energy - - - - -  Change in appetite - - - - -  Feeling bad or failure about yourself  - - - - -  Trouble  concentrating - - - - -  Moving slowly or fidgety/restless - - - - -  Suicidal thoughts - - - - -  PHQ-9 Score - - - - -  Difficult doing work/chores - - - - -    Functional Status Survey: Is the patient deaf or have difficulty hearing?: Yes Does the patient have difficulty seeing, even when wearing glasses/contacts?: No Does the patient have difficulty concentrating, remembering, or making decisions?: Yes(memory) Does the patient have difficulty walking or climbing stairs?: No Does the patient have difficulty dressing or bathing?: No Does the patient have difficulty doing errands alone such as visiting a doctor's office or shopping?: No  MMSE - Mini Mental State Exam 08/08/2019 07/28/2018  Orientation to time 5 5  Orientation to Place 5 5  Registration 3 3  Attention/ Calculation 5 5  Recall 3 3  Language- name 2 objects 2 2  Language- repeat 1 1  Language- follow 3 step command 3 3  Language- read & follow direction 1 1  Write a sentence 1 1  Copy design 1 1  Total score 30 30    Fall Risk  08/08/2019 12/14/2018 09/28/2018 07/28/2018 05/25/2018  Falls in the past year? 0 0 0 0 0  Number falls in past yr: - -  0 - -  Injury with Fall? - - 0 - -      LABS: Recent Results (from the past 2160 hour(s))  Urinalysis, Routine w reflex microscopic     Status: Abnormal   Collection Time: 08/08/19  1:49 PM  Result Value Ref Range   Specific Gravity, UA 1.015 1.005 - 1.030   pH, UA 5.0 5.0 - 7.5   Color, UA Yellow Yellow   Appearance Ur Clear Clear   Leukocytes,UA Trace (A) Negative   Protein,UA Negative Negative/Trace   Glucose, UA Negative Negative   Ketones, UA Negative Negative   RBC, UA Negative Negative   Bilirubin, UA Negative Negative   Urobilinogen, Ur 0.2 0.2 - 1.0 mg/dL   Nitrite, UA Negative Negative   Microscopic Examination See below:     Comment: Microscopic was indicated and was performed.  Microalbumin, urine     Status: None   Collection Time: 08/08/19  1:49 PM  Result Value Ref Range   Microalbumin, Urine 4.3 Not Estab. ug/mL  Microscopic Examination     Status: None   Collection Time: 08/08/19  1:49 PM   URINE  Result Value Ref Range   WBC, UA None seen 0 - 5 /hpf   RBC 0-2 0 - 2 /hpf   Epithelial Cells (non renal) 0-10 0 - 10 /hpf   Casts None seen None seen /lpf   Bacteria, UA None seen None seen/Few  POCT HgB A1C     Status: Abnormal   Collection Time: 08/08/19  2:59 PM  Result Value Ref Range   Hemoglobin A1C 5.9 (A) 4.0 - 5.6 %   HbA1c POC (<> result, manual entry)     HbA1c, POC (prediabetic range)     HbA1c, POC (controlled diabetic range)      Assessment/Plan: 1. Encounter for general adult medical examination with abnormal findings Annual health maintenance exam today. Lab slip given to have labs drawn.   2. Type 2 diabetes mellitus with hyperglycemia, without long-term current use of insulin (HCC) - POCT HgB A1C 5.9 today. Continue to control through diet and exercise.  - Microalbumin, urine - Ambulatory referral to Podiatry  3. Callus of foot Refer to  podiatry for further evaluation and treatment.  - Ambulatory referral to Podiatry  4.  Candidal vaginitis Start diflucan 150mg  tablet every other day for five doses. Use ketoconazole cream daily as needed to improve external symptoms.  - fluconazole (DIFLUCAN) 150 MG tablet; Take 1 tablet po every other day for 5 doses  Dispense: 5 tablet; Refill: 1 - ketoconazole (NIZORAL) 2 % cream; Apply 1 application topically daily.  Dispense: 30 g; Refill: 1  5. Other symptoms and signs involving the nervous system Patient experiencing dizziness, decline in memory, and balance problems. Will get CT of the head for further evaluation.  - CT Head Wo Contrast; Future  6. Moderate asthma without complication, unspecified whether persistent Stable. Continue with inhalers and respiratory medications as prescribed   7. Encounter for screening mammogram for malignant neoplasm of breast - MM DIGITAL SCREENING BILATERAL; Future  8. Dysuria - Urinalysis, Routine w reflex microscopic  General Counseling: Mykia verbalizes understanding of the findings of todays visit and agrees with plan of treatment. I have discussed any further diagnostic evaluation that may be needed or ordered today. We also reviewed her medications today. she has been encouraged to call the office with any questions or concerns that should arise related to todays visit.    Counseling:  This patient was seen by Leretha Pol FNP Collaboration with Dr Lavera Guise as a part of collaborative care agreement  Orders Placed This Encounter  Procedures  . Microscopic Examination  . MM DIGITAL SCREENING BILATERAL  . CT Head Wo Contrast  . Urinalysis, Routine w reflex microscopic  . Microalbumin, urine  . Ambulatory referral to Podiatry  . POCT HgB A1C    Meds ordered this encounter  Medications  . fluconazole (DIFLUCAN) 150 MG tablet    Sig: Take 1 tablet po every other day for 5 doses    Dispense:  5 tablet    Refill:  1    Order Specific Question:   Supervising Provider    Answer:   Lavera Guise [id number]  .  ketoconazole (NIZORAL) 2 % cream    Sig: Apply 1 application topically daily.    Dispense:  30 g    Refill:  1    Order Specific Question:   Supervising Provider    Answer:   Lavera Guise [id number]    Total time spent: 56 Minutes  Time spent includes review of chart, medications, test results, and follow up plan with the patient.     Lavera Guise, MD  Internal Medicine

## 2019-08-09 LAB — URINALYSIS, ROUTINE W REFLEX MICROSCOPIC
Bilirubin, UA: NEGATIVE
Glucose, UA: NEGATIVE
Ketones, UA: NEGATIVE
Nitrite, UA: NEGATIVE
Protein,UA: NEGATIVE
RBC, UA: NEGATIVE
Specific Gravity, UA: 1.015 (ref 1.005–1.030)
Urobilinogen, Ur: 0.2 mg/dL (ref 0.2–1.0)
pH, UA: 5 (ref 5.0–7.5)

## 2019-08-09 LAB — MICROSCOPIC EXAMINATION
Bacteria, UA: NONE SEEN
Casts: NONE SEEN /lpf
WBC, UA: NONE SEEN /hpf (ref 0–5)

## 2019-08-09 LAB — MICROALBUMIN, URINE: Microalbumin, Urine: 4.3 ug/mL

## 2019-08-11 DIAGNOSIS — E782 Mixed hyperlipidemia: Secondary | ICD-10-CM | POA: Diagnosis not present

## 2019-08-11 DIAGNOSIS — E1165 Type 2 diabetes mellitus with hyperglycemia: Secondary | ICD-10-CM | POA: Diagnosis not present

## 2019-08-11 DIAGNOSIS — E559 Vitamin D deficiency, unspecified: Secondary | ICD-10-CM | POA: Diagnosis not present

## 2019-08-11 DIAGNOSIS — Z0001 Encounter for general adult medical examination with abnormal findings: Secondary | ICD-10-CM | POA: Diagnosis not present

## 2019-08-11 DIAGNOSIS — I10 Essential (primary) hypertension: Secondary | ICD-10-CM | POA: Diagnosis not present

## 2019-08-13 DIAGNOSIS — B373 Candidiasis of vulva and vagina: Secondary | ICD-10-CM | POA: Insufficient documentation

## 2019-08-13 DIAGNOSIS — L84 Corns and callosities: Secondary | ICD-10-CM | POA: Insufficient documentation

## 2019-08-13 DIAGNOSIS — R29818 Other symptoms and signs involving the nervous system: Secondary | ICD-10-CM | POA: Insufficient documentation

## 2019-08-13 DIAGNOSIS — Z0001 Encounter for general adult medical examination with abnormal findings: Secondary | ICD-10-CM | POA: Insufficient documentation

## 2019-08-13 DIAGNOSIS — B3731 Acute candidiasis of vulva and vagina: Secondary | ICD-10-CM | POA: Insufficient documentation

## 2019-08-15 ENCOUNTER — Other Ambulatory Visit: Payer: Self-pay | Admitting: Student

## 2019-08-15 DIAGNOSIS — K22710 Barrett's esophagus with low grade dysplasia: Secondary | ICD-10-CM

## 2019-08-18 ENCOUNTER — Ambulatory Visit
Admission: RE | Admit: 2019-08-18 | Discharge: 2019-08-18 | Disposition: A | Discharge status: Home / Self Care | Payer: Medicare Other | Source: Ambulatory Visit | Attending: Nurse Practitioner | Admitting: Nurse Practitioner

## 2019-08-18 ENCOUNTER — Other Ambulatory Visit: Payer: Self-pay

## 2019-08-18 DIAGNOSIS — R29818 Other symptoms and signs involving the nervous system: Secondary | ICD-10-CM | POA: Diagnosis not present

## 2019-08-18 DIAGNOSIS — R519 Headache, unspecified: Secondary | ICD-10-CM | POA: Diagnosis not present

## 2019-08-22 NOTE — Progress Notes (Signed)
Please let the patient know that CT of her head was normal. Thanks.

## 2019-08-23 ENCOUNTER — Telehealth: Payer: Self-pay

## 2019-08-23 NOTE — Telephone Encounter (Signed)
Pt.notified

## 2019-08-23 NOTE — Telephone Encounter (Signed)
-----   Message from Ronnell Freshwater, NP sent at 08/22/2019  3:10 PM EDT ----- Please let the patient know that CT of her head was normal. Thanks.

## 2019-08-28 ENCOUNTER — Other Ambulatory Visit: Payer: Self-pay

## 2019-08-28 ENCOUNTER — Other Ambulatory Visit: Payer: Self-pay | Admitting: Adult Health

## 2019-08-28 DIAGNOSIS — J4541 Moderate persistent asthma with (acute) exacerbation: Secondary | ICD-10-CM

## 2019-08-28 DIAGNOSIS — G2581 Restless legs syndrome: Secondary | ICD-10-CM

## 2019-08-28 MED ORDER — ROPINIROLE HCL 0.5 MG PO TABS: ORAL_TABLET | ORAL | 3 refills | Status: DC

## 2019-08-30 DIAGNOSIS — M2041 Other hammer toe(s) (acquired), right foot: Secondary | ICD-10-CM | POA: Diagnosis not present

## 2019-08-30 DIAGNOSIS — M2012 Hallux valgus (acquired), left foot: Secondary | ICD-10-CM | POA: Diagnosis not present

## 2019-08-30 DIAGNOSIS — L851 Acquired keratosis [keratoderma] palmaris et plantaris: Secondary | ICD-10-CM | POA: Diagnosis not present

## 2019-08-31 ENCOUNTER — Encounter
Admission: RE | Admit: 2019-08-31 | Discharge: 2019-08-31 | Disposition: A | Discharge status: Home / Self Care | Payer: Medicare Other | Source: Ambulatory Visit | Attending: Student | Admitting: Student

## 2019-08-31 ENCOUNTER — Other Ambulatory Visit: Payer: Self-pay

## 2019-08-31 DIAGNOSIS — R197 Diarrhea, unspecified: Secondary | ICD-10-CM | POA: Diagnosis not present

## 2019-08-31 DIAGNOSIS — K2270 Barrett's esophagus without dysplasia: Secondary | ICD-10-CM | POA: Diagnosis not present

## 2019-08-31 DIAGNOSIS — R1110 Vomiting, unspecified: Secondary | ICD-10-CM | POA: Diagnosis not present

## 2019-08-31 DIAGNOSIS — K22710 Barrett's esophagus with low grade dysplasia: Secondary | ICD-10-CM | POA: Diagnosis not present

## 2019-08-31 MED ORDER — TECHNETIUM TC 99M SULFUR COLLOID
2.0000 | Freq: Once | INTRAVENOUS | Status: AC | PRN
  Administered 2019-08-31: 2.601 via INTRAVENOUS

## 2019-09-04 ENCOUNTER — Other Ambulatory Visit: Payer: Self-pay

## 2019-09-04 DIAGNOSIS — G2581 Restless legs syndrome: Secondary | ICD-10-CM

## 2019-09-04 MED ORDER — ROPINIROLE HCL 0.5 MG PO TABS: ORAL_TABLET | ORAL | 0 refills | Status: DC

## 2019-09-04 NOTE — Telephone Encounter (Signed)
Spoke with that heather change ropinirole 0.5 2 tab bid at last visit and also as per heather is ok to send

## 2019-09-05 ENCOUNTER — Telehealth: Payer: Self-pay

## 2019-09-05 NOTE — Telephone Encounter (Signed)
Lmom to confirm and screen for 09-07-19 ov.

## 2019-09-07 ENCOUNTER — Encounter: Payer: Self-pay | Admitting: Nurse Practitioner

## 2019-09-07 ENCOUNTER — Ambulatory Visit (INDEPENDENT_AMBULATORY_CARE_PROVIDER_SITE_OTHER): Payer: Medicare Other | Admitting: Nurse Practitioner

## 2019-09-07 ENCOUNTER — Other Ambulatory Visit: Payer: Self-pay

## 2019-09-07 VITALS — BP 114/61 | HR 93 | Temp 97.6°F | Resp 16 | Ht 63.0 in | Wt 172.4 lb

## 2019-09-07 DIAGNOSIS — J301 Allergic rhinitis due to pollen: Secondary | ICD-10-CM

## 2019-09-07 DIAGNOSIS — I10 Essential (primary) hypertension: Secondary | ICD-10-CM | POA: Diagnosis not present

## 2019-09-07 DIAGNOSIS — J45909 Unspecified asthma, uncomplicated: Secondary | ICD-10-CM | POA: Diagnosis not present

## 2019-09-07 DIAGNOSIS — R29818 Other symptoms and signs involving the nervous system: Secondary | ICD-10-CM | POA: Diagnosis not present

## 2019-09-07 NOTE — Progress Notes (Signed)
[facility] [place], [place] [address]  Internal MEDICINE  Office Visit Note  Patient Name: Summer Murphy  [id number]  [id number]  Date of Service: 09/20/2019  Chief Complaint  Patient presents with  . Depression  . Diabetes  . Hypertension    The patient is here for follow up visit. She had been having issues with vertigo. States that this is worse when she looks up or lays down. Causes her to have some nausea as well. She is having balance issues due to this. She feels like she has to hold onto furniture or the wall when walking at home so she does not fall. She is frequently tired. She also reports having some increased trouble with memory. A head CT was done since her most recent visit which was negative. She is seeing multiple specialist providers, including GI, neurology, and psychiatry to help with current symptoms.        Current Medication: Outpatient Encounter Medications as of 09/07/2019  Medication Sig  . ALPRAZolam (XANAX) 0.25 MG tablet Take 0.25 mg by mouth 2 (two) times daily as needed for anxiety.  Marland Kitchen BIOTIN PO Take by mouth daily.  . Brexpiprazole (REXULTI) 4 MG TABS Take 4 mg by mouth at bedtime.   . budesonide-formoterol (SYMBICORT) 160-4.5 MCG/ACT inhaler INHALE 2 PUFFS BY MOUTH TWICE DAILY AS NEEDED FOR RESPIRATORY ISSUES  . busPIRone (BUSPAR) 10 MG tablet Take 10 mg by mouth 2 (two) times daily.   . cetirizine (ZYRTEC) 10 MG tablet Take 1 tablet (10 mg total) by mouth daily.  . COMBIVENT RESPIMAT 20-100 MCG/ACT AERS respimat INHALE 1 PUFF INTO THE LUNGS EVERY 6 HOURS  . cyclobenzaprine (FLEXERIL) 10 MG tablet Take 1 tablet (10 mg total) by mouth 2 (two) times daily as needed for muscle spasms.  . diclofenac sodium (VOLTAREN) 1 % GEL Apply 2 g topically 2 (two) times daily as needed (for pain).   Marland Kitchen diltiazem (DILACOR XR) 180 MG 24 hr capsule Take 1 capsule (180 mg total) by mouth daily.  . diphenhydrAMINE (BENADRYL) 25 MG  tablet Take 50 mg by mouth 2 (two) times daily as needed.   . diphenoxylate-atropine (LOMOTIL) 2.5-0.025 MG tablet Take 1 tablet by mouth 3 (three) times daily as needed for diarrhea or loose stools.  . famotidine (PEPCID) 40 MG tablet Take 40 mg by mouth at bedtime.  . fluconazole (DIFLUCAN) 150 MG tablet Take 1 tablet po every other day for 5 doses  . gabapentin (NEURONTIN) 600 MG tablet Take 1 tablet (600 mg total) by mouth 2 (two) times daily.  Marland Kitchen ipratropium-albuterol (DUONEB) 0.5-2.5 (3) MG/3ML SOLN Take 3 mLs by nebulization every 4 (four) hours as needed (for shortness of breath/wheezing).  Marland Kitchen ketoconazole (NIZORAL) 2 % cream Apply 1 application topically daily.  Marland Kitchen LORazepam (ATIVAN) 1 MG tablet Take 1 mg by mouth at bedtime.  . meclizine (ANTIVERT) 12.5 MG tablet Take 1 tablet (12.5 mg total) by mouth 2 (two) times daily as needed for dizziness.  . montelukast (SINGULAIR) 10 MG tablet TAKE 1 TABLET(10 MG) BY MOUTH AT BEDTIME  . nystatin (MYCOSTATIN) 100000 UNIT/ML suspension Take 5 mLs (500,000 Units total) by mouth 3 (three) times daily.  . ondansetron (ZOFRAN) 4 MG tablet Take 1 tablet (4 mg total) by mouth every 8 (eight) hours as needed for nausea or vomiting.  . pantoprazole (PROTONIX) 40 MG tablet Take 40 mg by mouth 2 (two) times daily before a meal.  . rOPINIRole (REQUIP) 0.5 MG  tablet Take two tablets po BID for restless legs.  . SUMAtriptan (IMITREX) 100 MG tablet Take 1 tablet (100 mg total) by mouth every 2 (two) hours as needed (for migraine headaches.). May repeat in 1 hours if headache persists or recurs.  . Tiotropium Bromide Monohydrate (SPIRIVA RESPIMAT) 1.25 MCG/ACT AERS Inhale 2 puffs into the lungs daily.  . traZODone (DESYREL) 150 MG tablet Take 50 mg by mouth at bedtime.   . valACYclovir (VALTREX) 500 MG tablet Take 500 mg by mouth daily as needed (for fever blisters).   No facility-administered encounter medications on file as of 09/07/2019.    Surgical  History: Past Surgical History:  Procedure Laterality Date  . ABDOMINAL HYSTERECTOMY    . AMPUTATION TOE Left 07/31/2016   Procedure: AMPUTATION TOE/MPJ 2nd toe;  Surgeon: Sharlotte Alamo, DPM;  Location: [facility] ORS;  Service: Podiatry;  Laterality: Left;  . APPENDECTOMY  1990  . BACK SURGERY    . BREAST SURGERY     bilateral breast reduction  . CARDIAC ELECTROPHYSIOLOGY STUDY AND ABLATION    . CHOLECYSTECTOMY  1990  . COLONOSCOPY WITH PROPOFOL N/A 01/04/2017   Procedure: COLONOSCOPY WITH PROPOFOL;  Surgeon: Manya Silvas, MD;  Location: [facility] ENDOSCOPY;  Service: Endoscopy;  Laterality: N/A;  . CORNEAL TRANSPLANT    . ESOPHAGOGASTRODUODENOSCOPY (EGD) WITH PROPOFOL N/A 01/04/2017   Procedure: ESOPHAGOGASTRODUODENOSCOPY (EGD) WITH PROPOFOL;  Surgeon: Manya Silvas, MD;  Location: [facility] END[facility]COPY;  Service: Endoscopy;  Laterality: N/A;  . EXCISION BONE CYST Left 07/31/2016   Procedure: EXCISION BONE CYST/exostectomy 28124/left 2nd;  Surgeon: Sharlotte Alamo, DPM;  Location: [facility] ORS;  Service: Podiatry;  Laterality: Left;  . EXTRACORPOREAL SHOCK WAVE LITHOTRIPSY Left 09/12/2015   Procedure: EXTRACORPOREAL SHOCK WAVE LITHOTRIPSY (ESWL);  Surgeon: Hollice Espy, MD;  Location: [facility] ORS;  Service: Urology;  Laterality: Left;  . FRACTURE SURGERY     left foot  . HH repair    . JOINT REPLACEMENT Bilateral 2013,2014   total knees  . LAPAROSCOPIC HYSTERECTOMY    . LITHOTRIPSY    . TONSILLECTOMY    . URETEROSCOPY      Medical History: Past Medical History:  Diagnosis Date  . Acid reflux   . Anxiety   . Arrhythmia    treated with meds and has no current problems  . Arthritis    most uncomfortable in knees  . Asthma    uses several inhalers  . Depression   . Diabetes mellitus without complication (HCC)    sugars run low, not high sugars  . Fever blister   . Hematuria   . Hypertension   . Kidney stone    stones, 2nd lithotripsy  . Left flank pain   . Migraine   . Restless leg   .  Yeast vaginitis     Family History: Family History  Problem Relation Age of Onset  . Prostate cancer Father   . Kidney Stones Father   . Kidney disease Neg Hx     Social History   Socioeconomic History  . Marital status: Married    Spouse name: Not on file  . Number of children: Not on file  . Years of education: Not on file  . Highest education level: Not on file  Occupational History  . Not on file  Tobacco Use  . Smoking status: Never Smoker  . Smokeless tobacco: Never Used  Vaping Use  . Vaping Use: Never used  Substance and Sexual Activity  . Alcohol use: No  .  Drug use: No  . Sexual activity: Not on file  Other Topics Concern  . Not on file  Social History Narrative  . Not on file   Social Determinants of Health   Financial Resource Strain:   . Difficulty of Paying Living Expenses:   Food Insecurity:   . Worried About Charity fundraiser in the Last Year:   . Arboriculturist in the Last Year:   Transportation Needs:   . Film/video editor (Medical):   Marland Kitchen Lack of Transportation (Non-Medical):   Physical Activity:   . Days of Exercise per Week:   . Minutes of Exercise per Session:   Stress:   . Feeling of Stress :   Social Connections:   . Frequency of Communication with Friends and Family:   . Frequency of Social Gatherings with Friends and Family:   . Attends Religious Services:   . Active Member of Clubs or Organizations:   . Attends Archivist Meetings:   Marland Kitchen Marital Status:   Intimate Partner Violence:   . Fear of Current or Ex-Partner:   . Emotionally Abused:   Marland Kitchen Physically Abused:   . Sexually Abused:       Review of Systems  Constitutional: Positive for fatigue. Negative for chills and fever.  HENT: Negative for congestion, sinus pressure, sinus pain and sore throat.   Respiratory: Positive for shortness of breath and wheezing.        Well managed overall.   Cardiovascular: Negative for chest pain and palpitations.   Gastrointestinal: Positive for abdominal pain and diarrhea. Negative for constipation and vomiting.       The patient is seeing GI specialist at this time.   Endocrine: Negative for cold intolerance, heat intolerance, polydipsia and polyuria.  Musculoskeletal: Positive for arthralgias, back pain and gait problem.  Allergic/Immunologic: Positive for environmental allergies and food allergies.  Neurological: Positive for dizziness and headaches. Negative for light-headedness.       Symptoms are some improved since her last visit .  Hematological: Negative for adenopathy.  Psychiatric/Behavioral: Positive for dysphoric mood. The patient is hyperactive.     Today's Vitals   09/07/19 1549  BP: 114/61  Pulse: 93  Resp: 16  Temp: 97.6 F (36.4 C)  SpO2: 94%  Weight: 172 lb 6.4 oz (78.2 kg)  Height: 5\' 3"  (1.6 m)   Body mass index is 30.54 kg/m.  Physical Exam Vitals and nursing note reviewed.  Constitutional:      General: She is not in acute distress.    Appearance: Normal appearance. She is well-developed. She is not diaphoretic.  HENT:     Head: Normocephalic and atraumatic.     Right Ear: External ear normal.     Left Ear: External ear normal.     Mouth/Throat:     Pharynx: No oropharyngeal exudate.  Eyes:     Pupils: Pupils are equal, round, and reactive to light.  Neck:     Thyroid: No thyromegaly.     Vascular: No carotid bruit or JVD.     Trachea: No tracheal deviation.  Cardiovascular:     Rate and Rhythm: Normal rate and regular rhythm.     Heart sounds: Normal heart sounds. No murmur heard.  No friction rub. No gallop.   Pulmonary:     Effort: Pulmonary effort is normal. No respiratory distress.     Breath sounds: Normal breath sounds. No wheezing or rales.     Comments: Stridor auscultated in  upper airways.  Chest:     Chest wall: No tenderness.  Abdominal:     General: Bowel sounds are normal.     Palpations: Abdomen is soft.     Tenderness: There is no  abdominal tenderness.  Musculoskeletal:        General: Normal range of motion.     Cervical back: Normal range of motion and neck supple.  Lymphadenopathy:     Cervical: No cervical adenopathy.  Skin:    General: Skin is warm and dry.  Neurological:     General: No focal deficit present.     Mental Status: She is alert and oriented to person, place, and time. Mental status is at baseline.     Cranial Nerves: No cranial nerve deficit.  Psychiatric:        Mood and Affect: Mood normal.        Behavior: Behavior normal.        Thought Content: Thought content normal.        Judgment: Judgment normal.    Assessment/Plan: 1. Other symptoms and signs involving the nervous system Reviewed results of head CT which were negative. Patient is seeing neurologist. Will continue to monitor.   2. Essential hypertension Stable. Continue BP medication as prescribed   3. Moderate asthma without complication, unspecified whether persistent Well managed. Continue inhalers and respiratory medications as prescribed   4. Seasonal allergic rhinitis due to pollen Continue all treatments for allergies.   General Counseling: Deryn verbalizes understanding of the findings of todays visit and agrees with plan of treatment. I have discussed any further diagnostic evaluation that may be needed or ordered today. We also reviewed her medications today. she has been encouraged to call the office with any questions or concerns that should arise related to todays visit.   This patient was seen by Leretha Pol FNP Collaboration with Dr Lavera Guise as a part of collaborative care agreement   Total time spent: 20 Minutes    Time spent includes review of chart, medications, test results, and follow up plan with the patient.      Dr Lavera Guise Internal medicine

## 2019-09-20 DIAGNOSIS — J301 Allergic rhinitis due to pollen: Secondary | ICD-10-CM | POA: Insufficient documentation

## 2019-09-21 ENCOUNTER — Other Ambulatory Visit: Payer: Self-pay | Admitting: Internal Medicine

## 2019-09-21 DIAGNOSIS — J4541 Moderate persistent asthma with (acute) exacerbation: Secondary | ICD-10-CM

## 2019-09-23 ENCOUNTER — Emergency Department
Admission: EM | Admit: 2019-09-23 | Discharge: 2019-09-23 | Disposition: A | Discharge status: Home / Self Care | Payer: Medicare Other | Attending: Emergency Medicine | Admitting: Emergency Medicine

## 2019-09-23 ENCOUNTER — Emergency Department: Payer: Medicare Other

## 2019-09-23 ENCOUNTER — Other Ambulatory Visit: Payer: Self-pay

## 2019-09-23 DIAGNOSIS — Z79899 Other long term (current) drug therapy: Secondary | ICD-10-CM | POA: Insufficient documentation

## 2019-09-23 DIAGNOSIS — E1165 Type 2 diabetes mellitus with hyperglycemia: Secondary | ICD-10-CM | POA: Insufficient documentation

## 2019-09-23 DIAGNOSIS — Z7984 Long term (current) use of oral hypoglycemic drugs: Secondary | ICD-10-CM | POA: Insufficient documentation

## 2019-09-23 DIAGNOSIS — K449 Diaphragmatic hernia without obstruction or gangrene: Secondary | ICD-10-CM | POA: Diagnosis not present

## 2019-09-23 DIAGNOSIS — Z96653 Presence of artificial knee joint, bilateral: Secondary | ICD-10-CM | POA: Insufficient documentation

## 2019-09-23 DIAGNOSIS — J441 Chronic obstructive pulmonary disease with (acute) exacerbation: Secondary | ICD-10-CM | POA: Diagnosis not present

## 2019-09-23 DIAGNOSIS — R0602 Shortness of breath: Secondary | ICD-10-CM | POA: Diagnosis not present

## 2019-09-23 DIAGNOSIS — I10 Essential (primary) hypertension: Secondary | ICD-10-CM | POA: Diagnosis not present

## 2019-09-23 LAB — CBC
HCT: 38.6 % (ref 36.0–46.0)
Hemoglobin: 12.9 g/dL (ref 12.0–15.0)
MCH: 30 pg (ref 26.0–34.0)
MCHC: 33.4 g/dL (ref 30.0–36.0)
MCV: 89.8 fL (ref 80.0–100.0)
Platelets: 151 10*3/uL (ref 150–400)
RBC: 4.3 MIL/uL (ref 3.87–5.11)
RDW: 12.6 % (ref 11.5–15.5)
WBC: 6.2 10*3/uL (ref 4.0–10.5)
nRBC: 0 % (ref 0.0–0.2)

## 2019-09-23 LAB — BASIC METABOLIC PANEL
Anion gap: 12 (ref 5–15)
BUN: 9 mg/dL (ref 8–23)
CO2: 23 mmol/L (ref 22–32)
Calcium: 9 mg/dL (ref 8.9–10.3)
Chloride: 104 mmol/L (ref 98–111)
Creatinine, Ser: 0.67 mg/dL (ref 0.44–1.00)
GFR calc Af Amer: >60 mL/min (ref >60)
GFR calc non Af Amer: >60 mL/min (ref >60)
Glucose, Bld: 180 mg/dL — ABNORMAL HIGH (ref 70–99)
Potassium: 3.6 mmol/L (ref 3.5–5.1)
Sodium: 139 mmol/L (ref 135–145)

## 2019-09-23 LAB — HEPATIC FUNCTION PANEL
ALT: 32 U/L (ref 0–44)
AST: 34 U/L (ref 15–41)
Albumin: 3.8 g/dL (ref 3.5–5.0)
Alkaline Phosphatase: 78 U/L (ref 38–126)
Bilirubin, Direct: 0.1 mg/dL (ref 0.0–0.2)
Indirect Bilirubin: 0.5 mg/dL (ref 0.3–0.9)
Total Bilirubin: 0.6 mg/dL (ref 0.3–1.2)
Total Protein: 6.6 g/dL (ref 6.5–8.1)

## 2019-09-23 LAB — BRAIN NATRIURETIC PEPTIDE: B Natriuretic Peptide: 54.8 pg/mL (ref 0.0–100.0)

## 2019-09-23 LAB — TROPONIN I (HIGH SENSITIVITY): Troponin I (High Sensitivity): 5 ng/L (ref <18)

## 2019-09-23 MED ORDER — IPRATROPIUM-ALBUTEROL 0.5-2.5 (3) MG/3ML IN SOLN
3.0000 mL | Freq: Once | RESPIRATORY_TRACT | Status: AC
  Administered 2019-09-23: 3 mL via RESPIRATORY_TRACT

## 2019-09-23 MED ORDER — PREDNISONE 20 MG PO TABS
60.0000 mg | ORAL_TABLET | Freq: Once | ORAL | Status: AC
  Administered 2019-09-23: 60 mg via ORAL
  Filled 2019-09-23: qty 3

## 2019-09-23 MED ORDER — ACETAMINOPHEN 500 MG PO TABS
1000.0000 mg | ORAL_TABLET | Freq: Once | ORAL | Status: AC
  Administered 2019-09-23: 1000 mg via ORAL
  Filled 2019-09-23: qty 2

## 2019-09-23 MED ORDER — METHYLPREDNISOLONE SODIUM SUCC 125 MG IJ SOLR: 125.0000 mg | Freq: Once | INTRAMUSCULAR | Status: DC

## 2019-09-23 MED ORDER — ACETAMINOPHEN 325 MG PO TABS: 650.0000 mg | ORAL_TABLET | Freq: Once | ORAL | Status: DC

## 2019-09-23 MED ORDER — PREDNISONE 20 MG PO TABS: 40.0000 mg | ORAL_TABLET | Freq: Every day | ORAL | 0 refills | Status: AC

## 2019-09-23 MED ORDER — IPRATROPIUM-ALBUTEROL 0.5-2.5 (3) MG/3ML IN SOLN
3.0000 mL | Freq: Once | RESPIRATORY_TRACT | Status: AC
  Administered 2019-09-23: 3 mL via RESPIRATORY_TRACT
  Filled 2019-09-23: qty 9

## 2019-09-23 NOTE — Discharge Instructions (Addendum)
Continue using your breathing treatments and the steroids. Return to the ER if develop worsening shortness of breath or have any other concerns.

## 2019-09-23 NOTE — ED Provider Notes (Signed)
[facility] Emergency Department Provider Note  ____________________________________________   First MD Initiated Contact with Patient 09/23/19 1125     (approximate)  I have reviewed the triage vital signs and the nursing notes.   HISTORY  Chief Complaint Shortness of Breath    HPI Summer Murphy is a 64 y.o. female with COPD, asthma, diabetes who comes in with wheezing.  Patient states that she woke up this morning with shortness of breath.  Attempted to take her inhaler and DuoNeb without improvement.  Patient denies any fevers or coughs.  Her shortness of breath is moderate, constant, worse with exertion, better at rest.  Denies any risk factors for PE.  States that this feels very typical of her prior COPD exacerbations and she normally does better with steroids and some breathing treatments.  She has been Covid vaccinated denies any concerns for Covid at this time.  Denies any chest pain, abdominal pain, leg swelling.          Past Medical History:  Diagnosis Date  . Acid reflux   . Anxiety   . Arrhythmia    treated with meds and has no current problems  . Arthritis    most uncomfortable in knees  . Asthma    uses several inhalers  . Depression   . Diabetes mellitus without complication (HCC)    sugars run low, not high sugars  . Fever blister   . Hematuria   . Hypertension   . Kidney stone    stones, 2nd lithotripsy  . Left flank pain   . Migraine   . Restless leg   . Yeast vaginitis     Patient Active Problem List   Diagnosis Date Noted  . Seasonal allergic rhinitis due to pollen 09/20/2019  . Callus of foot 08/13/2019  . Candidal vaginitis 08/13/2019  . Other symptoms and signs involving the nervous system 08/13/2019  . Encounter for general adult medical examination with abnormal findings 08/13/2019  . Restless leg syndrome 04/30/2019  . Type 2 diabetes mellitus with hyperglycemia ([place]) 04/02/2019  . Muscle cramps  04/02/2019  . Diastolic dysfunction 98/33/8250  . SOB (shortness of breath) 12/14/2018  . Hiatal hernia with GERD 12/14/2018  . Wheezing 12/14/2018  . Impaired fasting glucose 12/14/2018  . COPD with acute exacerbation ([place]) 09/28/2018  . Acute upper respiratory infection 08/25/2018  . Recurrent sinusitis 08/15/2018  . Prediabetes 08/15/2018  . Nonintractable headache 08/15/2018  . Dysuria 08/15/2018  . Intractable migraine without status migrainosus 04/22/2018  . Nausea 04/22/2018  . Polyneuropathy associated with underlying disease ([place]) 04/22/2018  . Midline low back pain without sciatica 04/22/2018  . Routine cervical smear 04/22/2018  . Essential hypertension 07/01/2017  . Acute bronchitis with asthma 07/01/2017  . Allergic rhinitis 07/01/2017  . Moderate asthma without complication 53/97/6734  . Severe recurrent major depression without psychotic features ([place]) 09/10/2014  . COPD (chronic obstructive pulmonary disease) ([place]) 09/10/2014  . Calculi, ureter 04/26/2013  . Corneal graft malfunction 08/17/2012  . Calculus of kidney 04/26/2012  . Renal colic 19/37/9024  . Urge incontinence 04/26/2012  . Diaphragmatic hernia 10/27/2010  . Barrett esophagus 07/07/2010    Past Surgical History:  Procedure Laterality Date  . ABDOMINAL HYSTERECTOMY    . AMPUTATION TOE Left 07/31/2016   Procedure: AMPUTATION TOE/MPJ 2nd toe;  Surgeon: Sharlotte Alamo, DPM;  Location: [facility] ORS;  Service: Podiatry;  Laterality: Left;  . APPENDECTOMY  1990  . BACK SURGERY    . BREAST SURGERY  bilateral breast reduction  . CARDIAC ELECTROPHYSIOLOGY STUDY AND ABLATION    . CHOLECYSTECTOMY  1990  . COLONOSCOPY WITH PROPOFOL N/A 01/04/2017   Procedure: COLONOSCOPY WITH PROPOFOL;  Surgeon: Manya Silvas, MD;  Location: [facility] ENDOSCOPY;  Service: Endoscopy;  Laterality: N/A;  . CORNEAL TRANSPLANT    . ESOPHAGOGASTRODUODENOSCOPY (EGD) WITH PROPOFOL N/A 01/04/2017   Procedure: ESOPHAGOGASTRODUODENOSCOPY  (EGD) WITH PROPOFOL;  Surgeon: Manya Silvas, MD;  Location: [facility] ENDOSCOPY;  Service: Endoscopy;  Laterality: N/A;  . EXCISION BONE CYST Left 07/31/2016   Procedure: EXCISION BONE CYST/exostectomy 28124/left 2nd;  Surgeon: Sharlotte Alamo, DPM;  Location: [facility] ORS;  Service: Podiatry;  Laterality: Left;  . EXTRACORPOREAL SHOCK WAVE LITHOTRIPSY Left 09/12/2015   Procedure: EXTRACORPOREAL SHOCK WAVE LITHOTRIPSY (ESWL);  Surgeon: Hollice Espy, MD;  Location: [facility] ORS;  Service: Urology;  Laterality: Left;  . FRACTURE SURGERY     left foot  . HH repair    . JOINT REPLACEMENT Bilateral 2013,2014   total knees  . LAPAROSCOPIC HYSTERECTOMY    . LITHOTRIPSY    . TONSILLECTOMY    . URETEROSCOPY      Prior to Admission medications   Medication Sig Start Date End Date Taking? Authorizing Provider  ALPRAZolam (XANAX) 0.25 MG tablet Take 0.25 mg by mouth 2 (two) times daily as needed for anxiety.    [provider]  BIOTIN PO Take by mouth daily.    [provider]  Brexpiprazole (REXULTI) 4 MG TABS Take 4 mg by mouth at bedtime.     [provider]  budesonide-formoterol (SYMBICORT) 160-4.5 MCG/ACT inhaler INHALE 2 PUFFS BY MOUTH TWICE DAILY AS NEEDED FOR RESPIRATORY ISSUES 07/25/19   Ronnell Freshwater, NP  busPIRone (BUSPAR) 10 MG tablet Take 10 mg by mouth 2 (two) times daily.  07/13/18   [provider]  cetirizine (ZYRTEC) 10 MG tablet Take 1 tablet (10 mg total) by mouth daily. 06/01/19   Scarboro, Audie Clear, NP  COMBIVENT RESPIMAT 20-100 MCG/ACT AERS respimat INHALE 1 PUFF INTO THE LUNGS EVERY 6 HOURS 09/21/19   Ronnell Freshwater, NP  cyclobenzaprine (FLEXERIL) 10 MG tablet Take 1 tablet (10 mg total) by mouth 2 (two) times daily as needed for muscle spasms. 04/22/18   Ronnell Freshwater, NP  diclofenac sodium (VOLTAREN) 1 % GEL Apply 2 g topically 2 (two) times daily as needed (for pain).     [provider]  diltiazem (DILACOR XR) 180 MG 24 hr capsule Take 1  capsule (180 mg total) by mouth daily. 04/28/19   Ronnell Freshwater, NP  diphenhydrAMINE (BENADRYL) 25 MG tablet Take 50 mg by mouth 2 (two) times daily as needed.     [provider]  diphenoxylate-atropine (LOMOTIL) 2.5-0.025 MG tablet Take 1 tablet by mouth 3 (three) times daily as needed for diarrhea or loose stools. 05/01/19   Ronnell Freshwater, NP  famotidine (PEPCID) 40 MG tablet Take 40 mg by mouth at bedtime.    [provider]  fluconazole (DIFLUCAN) 150 MG tablet Take 1 tablet po every other day for 5 doses 08/08/19   Ronnell Freshwater, NP  gabapentin (NEURONTIN) 600 MG tablet Take 1 tablet (600 mg total) by mouth 2 (two) times daily. 07/25/19   Ronnell Freshwater, NP  ipratropium-albuterol (DUONEB) 0.5-2.5 (3) MG/3ML SOLN Take 3 mLs by nebulization every 4 (four) hours as needed (for shortness of breath/wheezing). 01/25/19   Kendell Bane, NP  ketoconazole (NIZORAL) 2 % cream Apply 1 application  topically daily. 08/08/19   Ronnell Freshwater, NP  LORazepam (ATIVAN) 1 MG tablet Take 1 mg by mouth at bedtime.    [provider]  meclizine (ANTIVERT) 12.5 MG tablet Take 1 tablet (12.5 mg total) by mouth 2 (two) times daily as needed for dizziness. 01/13/19   Ronnell Freshwater, NP  montelukast (SINGULAIR) 10 MG tablet TAKE 1 TABLET(10 MG) BY MOUTH AT BEDTIME 07/30/19   Lavera Guise, MD  nystatin (MYCOSTATIN) 100000 UNIT/ML suspension Take 5 mLs (500,000 Units total) by mouth 3 (three) times daily. 10/31/18   Scarboro, Audie Clear, NP  ondansetron (ZOFRAN) 4 MG tablet Take 1 tablet (4 mg total) by mouth every 8 (eight) hours as needed for nausea or vomiting. 05/30/19   Ronnell Freshwater, NP  pantoprazole (PROTONIX) 40 MG tablet Take 40 mg by mouth 2 (two) times daily before a meal.    [provider]  rOPINIRole (REQUIP) 0.5 MG tablet Take two tablets po BID for restless legs. 09/04/19   Ronnell Freshwater, NP  SUMAtriptan (IMITREX) 100 MG tablet Take 1 tablet (100 mg total) by  mouth every 2 (two) hours as needed (for migraine headaches.). May repeat in 1 hours if headache persists or recurs. 11/01/18   Ronnell Freshwater, NP  Tiotropium Bromide Monohydrate (SPIRIVA RESPIMAT) 1.25 MCG/ACT AERS Inhale 2 puffs into the lungs daily. 07/26/19   Ronnell Freshwater, NP  traZODone (DESYREL) 150 MG tablet Take 50 mg by mouth at bedtime.     [provider]  valACYclovir (VALTREX) 500 MG tablet Take 500 mg by mouth daily as needed (for fever blisters).    [provider]    Allergies Librium [chlordiazepoxide], Metoclopramide, Nsaids, Vanilla, Aspirin, Azithromycin, Buprenorphine hcl, Chlordiazepoxide hcl, Iron, Morphine, Morphine and related, Sulfa antibiotics, Tolmetin, and Amlodipine besylate  Family History  Problem Relation Age of Onset  . Prostate cancer Father   . Kidney Stones Father   . Kidney disease Neg Hx     Social History Social History   Tobacco Use  . Smoking status: Never Smoker  . Smokeless tobacco: Never Used  Vaping Use  . Vaping Use: Never used  Substance Use Topics  . Alcohol use: No  . Drug use: No      Review of Systems Constitutional: No fever/chills Eyes: No visual changes. ENT: No sore throat. Cardiovascular: Denies chest pain. Respiratory: Positive shortness of breath Gastrointestinal: No abdominal pain.  No nausea, no vomiting.  No diarrhea.  No constipation. Genitourinary: Negative for dysuria. Musculoskeletal: Negative for back pain. Skin: Negative for rash. Neurological: Negative for headaches, focal weakness or numbness. All other ROS negative ____________________________________________   PHYSICAL EXAM:  VITAL SIGNS: ED Triage Vitals  Enc Vitals Group     BP 09/23/19 1108 (!) 141/110     Pulse Rate 09/23/19 1106 (!) 106     Resp 09/23/19 1106 18     Temp 09/23/19 1106 98.4 F (36.9 C)     Temp Source 09/23/19 1106 Oral     SpO2 09/23/19 1106 97 %     Weight 09/23/19 1106 169 lb (76.7 kg)      Height 09/23/19 1106 5\' 3"  (1.6 m)     Head Circumference --      Peak Flow --      Pain Score 09/23/19 1106 5     Pain Loc --      Pain Edu? --      Excl. in [place]? --  Constitutional: Alert and oriented. Well appearing and in no acute distress. Eyes: Conjunctivae are normal. EOMI. Head: Atraumatic. Nose: No congestion/rhinnorhea. Mouth/Throat: Mucous membranes are moist.   Neck: No stridor. Trachea Midline. FROM Cardiovascular: Tachycardic, regular rhythm. Grossly normal heart sounds.  Good peripheral circulation. Respiratory: Normal respiratory effort.  No retractions.  Wheezing noted bilaterally Gastrointestinal: Soft and nontender. No distention. No abdominal bruits.  Musculoskeletal: No lower extremity tenderness nor edema.  No joint effusions. Neurologic:  Normal speech and language. No gross focal neurologic deficits are appreciated.  Skin:  Skin is warm, dry and intact. No rash noted. Psychiatric: Mood and affect are normal. Speech and behavior are normal. GU: Deferred   ____________________________________________   LABS (all labs ordered are listed, but only abnormal results are displayed)  Labs Reviewed  BASIC METABOLIC PANEL - Abnormal; Notable for the following components:      Result Value   Glucose, Bld 180 (*)    All other components within normal limits  CBC  HEPATIC FUNCTION PANEL  BRAIN NATRIURETIC PEPTIDE  TROPONIN I (HIGH SENSITIVITY)   ____________________________________________   ED ECG REPORT I, Vanessa Malvern, the attending physician, personally viewed and interpreted this ECG.  First EKG difficult to interpret due to patient moving around. Will get repeat  EKG normal sinus rate 93, no ST elevation, T wave inversion in 1 aVL V5 and V6 with a left bundle branch block. Patient has had left bundle branch block previously and similar to inversions in 1 and aVL ____________________________________________  RADIOLOGY I, Vanessa Lares, personally  viewed and evaluated these images (plain radiographs) as part of my medical decision making, as well as reviewing the written report by the radiologist.  ED MD interpretation: No pneumonia  Official radiology report(s): DG Chest 2 View  Result Date: 09/23/2019 CLINICAL DATA:  Shortness of breath EXAM: CHEST - 2 VIEW COMPARISON:  November 10, 2018 FINDINGS: The heart size and mediastinal contours are within normal limits. Both lungs are clear. The visualized skeletal structures are unremarkable. Hiatal hernia is identified unchanged. IMPRESSION: No active cardiopulmonary disease. Electronically Signed   By: Abelardo Diesel M.D.   On: 09/23/2019 12:24    ____________________________________________   PROCEDURES  Procedure(s) performed (including Critical Care):  Procedures   ____________________________________________   INITIAL IMPRESSION / ASSESSMENT AND PLAN / ED COURSE  Summer Murphy was evaluated in Emergency Department on 09/23/2019 for the symptoms described in the history of present illness. She was evaluated in the context of the global COVID-19 pandemic, which necessitated consideration that the patient might be at risk for infection with the SARS-CoV-2 virus that causes COVID-19. Institutional protocols and algorithms that pertain to the evaluation of patients at risk for COVID-19 are in a state of rapid change based on information released by regulatory bodies including the CDC and federal and state organizations. These policies and algorithms were followed during the patient's care in the ED.    Patient is a well-appearing 64 year old who has no increased work of breathing but does have wheezing bilaterally noted.  Her vital signs are reassuring.  Will get labs to make sure no signs of ACS, chest x-ray to make sure no pneumonia.  I have very low suspicion for PE given no risk factors for this and her wheezing on exam seems consistent with her prior COPD exacerbations and  patient reports this is very similar to her prior COPD flares.She denies concern for Covid. Has already had Covid vaccines.  Will give patient 3 duo  nebs and steroids and reevaluate afterwards  No signs of anemia.  No signs of heart attack.  On reevaluation patient continues to look very comfortable.  Still has some minimal wheezing on exam.  Discussed with patient admission versus discharge based upon her feelings.  Will ambulate patient to see how she does.  Patient ambulates with normal saturations.  Patient states that she is feeling much better and feels comfortable with being discharged home at this time.  We will give her some steroids to go home with.  Gold's criteria is negative so we will hold off on antibiotics  I discussed the provisional nature of ED diagnosis, the treatment so far, the ongoing plan of care, follow up appointments and return precautions with the patient and any family or support people present. They expressed understanding and agreed with the plan, discharged home.  ____________________________________________   FINAL CLINICAL IMPRESSION(S) / ED DIAGNOSES   Final diagnoses:  COPD exacerbation ([place])      MEDICATIONS GIVEN DURING THIS VISIT:  Medications  ipratropium-albuterol (DUONEB) 0.5-2.5 (3) MG/3ML nebulizer solution 3 mL (3 mLs Nebulization Given 09/23/19 1211)  ipratropium-albuterol (DUONEB) 0.5-2.5 (3) MG/3ML nebulizer solution 3 mL (3 mLs Nebulization Given 09/23/19 1211)  ipratropium-albuterol (DUONEB) 0.5-2.5 (3) MG/3ML nebulizer solution 3 mL (3 mLs Nebulization Given 09/23/19 1210)  predniSONE (DELTASONE) tablet 60 mg (60 mg Oral Given 09/23/19 1209)  acetaminophen (TYLENOL) tablet 1,000 mg (1,000 mg Oral Given 09/23/19 1236)     ED Discharge Orders         Ordered    predniSONE (DELTASONE) 20 MG tablet  Daily     Discontinue  Reprint     09/23/19 1445           Note:  This document was prepared using Dragon voice recognition software and may  include unintentional dictation errors.   Vanessa [employer], MD 09/24/19 [phone]

## 2019-09-23 NOTE — ED Triage Notes (Signed)
Pt comes POV with some increasing SOB and asthma type symptoms starting this morning. Pt tried her inhaler with no relief. States that she has COPD as well.

## 2019-09-23 NOTE — ED Notes (Signed)
Pt verbalized understanding of discharge instructions. NAD at this time. 

## 2019-09-23 NOTE — ED Notes (Signed)
Pt ambulated without trouble with an average Osat of 99%

## 2019-09-28 ENCOUNTER — Telehealth: Payer: Self-pay

## 2019-09-28 NOTE — Telephone Encounter (Signed)
Confirmed appointment on 10/02/2019 and screened for covid. klh 

## 2019-10-02 ENCOUNTER — Ambulatory Visit (INDEPENDENT_AMBULATORY_CARE_PROVIDER_SITE_OTHER): Payer: Medicare Other | Admitting: Internal Medicine

## 2019-10-02 ENCOUNTER — Encounter: Payer: Self-pay | Admitting: Internal Medicine

## 2019-10-02 ENCOUNTER — Other Ambulatory Visit: Payer: Self-pay

## 2019-10-02 VITALS — BP 116/75 | HR 92 | Temp 97.9°F | Resp 16 | Ht 63.0 in | Wt 171.4 lb

## 2019-10-02 DIAGNOSIS — J301 Allergic rhinitis due to pollen: Secondary | ICD-10-CM | POA: Diagnosis not present

## 2019-10-02 DIAGNOSIS — I10 Essential (primary) hypertension: Secondary | ICD-10-CM | POA: Diagnosis not present

## 2019-10-02 DIAGNOSIS — K219 Gastro-esophageal reflux disease without esophagitis: Secondary | ICD-10-CM

## 2019-10-02 DIAGNOSIS — J45909 Unspecified asthma, uncomplicated: Secondary | ICD-10-CM

## 2019-10-02 NOTE — Progress Notes (Signed)
[facility] [place], [place] [address]  Pulmonary Sleep Medicine   Office Visit Note  Patient Name: Summer Murphy DOB: Sep 04, 1955 MRN [id number]  Date of Service: 10/02/2019  Complaints/HPI: Pt is here for pulmonary follow up.  On 09/23/19 she was having breathing difficulty and went to the [facility] ED.  She tried using her inhalers with no improvement. In the ER she was given Po steroids, as apparently the ED could not obtain IV access. She improved with this and neb tx.  She was sent home and has done well since.  Her blood pressure, GERD and asthma are currently controlled.   ROS  General: (-) fever, (-) chills, (-) night sweats, (-) weakness Skin: (-) rashes, (-) itching,. Eyes: (-) visual changes, (-) redness, (-) itching. Nose and Sinuses: (-) nasal stuffiness or itchiness, (-) postnasal drip, (-) nosebleeds, (-) sinus trouble. Mouth and Throat: (-) sore throat, (-) hoarseness. Neck: (-) swollen glands, (-) enlarged thyroid, (-) neck pain. Respiratory: - cough, (-) bloody sputum, - shortness of breath, - wheezing. Cardiovascular: - ankle swelling, (-) chest pain. Lymphatic: (-) lymph node enlargement. Neurologic: (-) numbness, (-) tingling. Psychiatric: (-) anxiety, (-) depression   Current Medication: Outpatient Encounter Medications as of 10/02/2019  Medication Sig  . ALPRAZolam (XANAX) 0.25 MG tablet Take 0.25 mg by mouth 2 (two) times daily as needed for anxiety.  Marland Kitchen BIOTIN PO Take by mouth daily.  . Brexpiprazole (REXULTI) 4 MG TABS Take 4 mg by mouth at bedtime.   . budesonide-formoterol (SYMBICORT) 160-4.5 MCG/ACT inhaler INHALE 2 PUFFS BY MOUTH TWICE DAILY AS NEEDED FOR RESPIRATORY ISSUES  . busPIRone (BUSPAR) 10 MG tablet Take 10 mg by mouth 2 (two) times daily.   . cetirizine (ZYRTEC) 10 MG tablet Take 1 tablet (10 mg total) by mouth daily.  . COMBIVENT RESPIMAT 20-100 MCG/ACT AERS respimat INHALE 1 PUFF INTO THE LUNGS EVERY 6 HOURS   . cyclobenzaprine (FLEXERIL) 10 MG tablet Take 1 tablet (10 mg total) by mouth 2 (two) times daily as needed for muscle spasms.  . diclofenac sodium (VOLTAREN) 1 % GEL Apply 2 g topically 2 (two) times daily as needed (for pain).   Marland Kitchen diltiazem (DILACOR XR) 180 MG 24 hr capsule Take 1 capsule (180 mg total) by mouth daily.  . diphenhydrAMINE (BENADRYL) 25 MG tablet Take 50 mg by mouth 2 (two) times daily as needed.   . diphenoxylate-atropine (LOMOTIL) 2.5-0.025 MG tablet Take 1 tablet by mouth 3 (three) times daily as needed for diarrhea or loose stools.  . famotidine (PEPCID) 40 MG tablet Take 40 mg by mouth at bedtime.  . fluconazole (DIFLUCAN) 150 MG tablet Take 1 tablet po every other day for 5 doses  . gabapentin (NEURONTIN) 600 MG tablet Take 1 tablet (600 mg total) by mouth 2 (two) times daily.  Marland Kitchen ipratropium-albuterol (DUONEB) 0.5-2.5 (3) MG/3ML SOLN Take 3 mLs by nebulization every 4 (four) hours as needed (for shortness of breath/wheezing).  Marland Kitchen ketoconazole (NIZORAL) 2 % cream Apply 1 application topically daily.  Marland Kitchen LORazepam (ATIVAN) 1 MG tablet Take 1 mg by mouth at bedtime.  . meclizine (ANTIVERT) 12.5 MG tablet Take 1 tablet (12.5 mg total) by mouth 2 (two) times daily as needed for dizziness.  . montelukast (SINGULAIR) 10 MG tablet TAKE 1 TABLET(10 MG) BY MOUTH AT BEDTIME  . nystatin (MYCOSTATIN) 100000 UNIT/ML suspension Take 5 mLs (500,000 Units total) by mouth 3 (three) times daily.  . ondansetron (ZOFRAN) 4 MG tablet  Take 1 tablet (4 mg total) by mouth every 8 (eight) hours as needed for nausea or vomiting.  . pantoprazole (PROTONIX) 40 MG tablet Take 40 mg by mouth 2 (two) times daily before a meal.  . rOPINIRole (REQUIP) 0.5 MG tablet Take two tablets po BID for restless legs.  . SUMAtriptan (IMITREX) 100 MG tablet Take 1 tablet (100 mg total) by mouth every 2 (two) hours as needed (for migraine headaches.). May repeat in 1 hours if headache persists or recurs.  . Tiotropium  Bromide Monohydrate (SPIRIVA RESPIMAT) 1.25 MCG/ACT AERS Inhale 2 puffs into the lungs daily.  . traZODone (DESYREL) 150 MG tablet Take 50 mg by mouth at bedtime.   . valACYclovir (VALTREX) 500 MG tablet Take 500 mg by mouth daily as needed (for fever blisters).   No facility-administered encounter medications on file as of 10/02/2019.    Surgical History: Past Surgical History:  Procedure Laterality Date  . ABDOMINAL HYSTERECTOMY    . AMPUTATION TOE Left 07/31/2016   Procedure: AMPUTATION TOE/MPJ 2nd toe;  Surgeon: Sharlotte Alamo, DPM;  Location: [facility] ORS;  Service: Podiatry;  Laterality: Left;  . APPENDECTOMY  1990  . BACK SURGERY    . BREAST SURGERY     bilateral breast reduction  . CARDIAC ELECTROPHYSIOLOGY STUDY AND ABLATION    . CHOLECYSTECTOMY  1990  . COLONOSCOPY WITH PROPOFOL N/A 01/04/2017   Procedure: COLONOSCOPY WITH PROPOFOL;  Surgeon: Manya Silvas, MD;  Location: [facility] ENDOSCOPY;  Service: Endoscopy;  Laterality: N/A;  . CORNEAL TRANSPLANT    . ESOPHAGOGASTRODUODENOSCOPY (EGD) WITH PROPOFOL N/A 01/04/2017   Procedure: ESOPHAGOGASTRODUODENOSCOPY (EGD) WITH PROPOFOL;  Surgeon: Manya Silvas, MD;  Location: [facility] ENDOSCOPY;  Service: Endoscopy;  Laterality: N/A;  . EXCISION BONE CYST Left 07/31/2016   Procedure: EXCISION BONE CYST/exostectomy 28124/left 2nd;  Surgeon: Sharlotte Alamo, DPM;  Location: [facility] ORS;  Service: Podiatry;  Laterality: Left;  . EXTRACORPOREAL SHOCK WAVE LITHOTRIPSY Left 09/12/2015   Procedure: EXTRACORPOREAL SHOCK WAVE LITHOTRIPSY (ESWL);  Surgeon: Hollice Espy, MD;  Location: [facility] ORS;  Service: Urology;  Laterality: Left;  . FRACTURE SURGERY     left foot  . HH repair    . JOINT REPLACEMENT Bilateral 2013,2014   total knees  . LAPAROSCOPIC HYSTERECTOMY    . LITHOTRIPSY    . TONSILLECTOMY    . URETEROSCOPY      Medical History: Past Medical History:  Diagnosis Date  . Acid reflux   . Anxiety   . Arrhythmia    treated with meds and has no  current problems  . Arthritis    most uncomfortable in knees  . Asthma    uses several inhalers  . Depression   . Diabetes mellitus without complication (HCC)    sugars run low, not high sugars  . Fever blister   . Hematuria   . Hypertension   . Kidney stone    stones, 2nd lithotripsy  . Left flank pain   . Migraine   . Restless leg   . Yeast vaginitis     Family History: Family History  Problem Relation Age of Onset  . Prostate cancer Father   . Kidney Stones Father   . Kidney disease Neg Hx     Social History: Social History   Socioeconomic History  . Marital status: Married    Spouse name: Not on file  . Number of children: Not on file  . Years of education: Not on file  . Highest education level: Not on  file  Occupational History  . Not on file  Tobacco Use  . Smoking status: Never Smoker  . Smokeless tobacco: Never Used  Vaping Use  . Vaping Use: Never used  Substance and Sexual Activity  . Alcohol use: No  . Drug use: No  . Sexual activity: Not on file  Other Topics Concern  . Not on file  Social History Narrative  . Not on file   Social Determinants of Health   Financial Resource Strain:   . Difficulty of Paying Living Expenses:   Food Insecurity:   . Worried About Charity fundraiser in the Last Year:   . Arboriculturist in the Last Year:   Transportation Needs:   . Film/video editor (Medical):   Marland Kitchen Lack of Transportation (Non-Medical):   Physical Activity:   . Days of Exercise per Week:   . Minutes of Exercise per Session:   Stress:   . Feeling of Stress :   Social Connections:   . Frequency of Communication with Friends and Family:   . Frequency of Social Gatherings with Friends and Family:   . Attends Religious Services:   . Active Member of Clubs or Organizations:   . Attends Archivist Meetings:   Marland Kitchen Marital Status:   Intimate Partner Violence:   . Fear of Current or Ex-Partner:   . Emotionally Abused:   Marland Kitchen  Physically Abused:   . Sexually Abused:     Vital Signs: Blood pressure 116/75, pulse 92, temperature 97.9 F (36.6 C), resp. rate 16, height 5\' 3"  (1.6 m), weight 171 lb 6.4 oz (77.7 kg), SpO2 96 %.  Examination: General Appearance: The patient is well-developed, well-nourished, and in no distress. Skin: Gross inspection of skin unremarkable. Head: normocephalic, no gross deformities. Eyes: no gross deformities noted. ENT: ears appear grossly normal no exudates. Neck: Supple. No thyromegaly. No LAD. Respiratory: clear bilaterally. Cardiovascular: Normal S1 and S2 without murmur or rub. Extremities: No cyanosis. pulses are equal. Neurologic: Alert and oriented. No involuntary movements.  LABS: Recent Results (from the past 2160 hour(s))  Urinalysis, Routine w reflex microscopic     Status: Abnormal   Collection Time: 08/08/19  1:49 PM  Result Value Ref Range   Specific Gravity, UA 1.015 1.005 - 1.030   pH, UA 5.0 5.0 - 7.5   Color, UA Yellow Yellow   Appearance Ur Clear Clear   Leukocytes,UA Trace (A) Negative   Protein,UA Negative Negative/Trace   Glucose, UA Negative Negative   Ketones, UA Negative Negative   RBC, UA Negative Negative   Bilirubin, UA Negative Negative   Urobilinogen, Ur 0.2 0.2 - 1.0 mg/dL   Nitrite, UA Negative Negative   Microscopic Examination See below:     Comment: Microscopic was indicated and was performed.  Microalbumin, urine     Status: None   Collection Time: 08/08/19  1:49 PM  Result Value Ref Range   Microalbumin, Urine 4.3 Not Estab. ug/mL  Microscopic Examination     Status: None   Collection Time: 08/08/19  1:49 PM   URINE  Result Value Ref Range   WBC, UA None seen 0 - 5 /hpf   RBC 0-2 0 - 2 /hpf   Epithelial Cells (non renal) 0-10 0 - 10 /hpf   Casts None seen None seen /lpf   Bacteria, UA None seen None seen/Few  POCT HgB A1C     Status: Abnormal   Collection Time: 08/08/19  2:59 PM  Result Value Ref Range   Hemoglobin A1C  5.9 (A) 4.0 - 5.6 %   HbA1c POC (<> result, manual entry)     HbA1c, POC (prediabetic range)     HbA1c, POC (controlled diabetic range)    Basic metabolic panel     Status: Abnormal   Collection Time: 09/23/19 11:49 AM  Result Value Ref Range   Sodium 139 135 - 145 mmol/L   Potassium 3.6 3.5 - 5.1 mmol/L   Chloride 104 98 - 111 mmol/L   CO2 23 22 - 32 mmol/L   Glucose, Bld 180 (H) 70 - 99 mg/dL    Comment: Glucose reference range applies only to samples taken after fasting for at least 8 hours.   BUN 9 8 - 23 mg/dL   Creatinine, Ser 0.67 0.44 - 1.00 mg/dL   Calcium 9.0 8.9 - 10.3 mg/dL   GFR calc non Af Amer >60 >60 mL/min   GFR calc Af Amer >60 >60 mL/min   Anion gap 12 5 - 15    Comment: Performed at [facility], [address] [place], [place] [address]  CBC     Status: None   Collection Time: 09/23/19 11:49 AM  Result Value Ref Range   WBC 6.2 4.0 - 10.5 K/uL   RBC 4.30 3.87 - 5.11 MIL/uL   Hemoglobin 12.9 12.0 - 15.0 g/dL   HCT 38.6 36 - 46 %   MCV 89.8 80.0 - 100.0 fL   MCH 30.0 26.0 - 34.0 pg   MCHC 33.4 30.0 - 36.0 g/dL   RDW 12.6 11.5 - 15.5 %   Platelets 151 150 - 400 K/uL   nRBC 0.0 0.0 - 0.2 %    Comment: Performed at [facility], [address], [place] [address]  Troponin I (High Sensitivity)     Status: None   Collection Time: 09/23/19 11:49 AM  Result Value Ref Range   Troponin I (High Sensitivity) 5 <18 ng/L    Comment: (NOTE) Elevated high sensitivity troponin I (hsTnI) values and significant  changes across serial measurements may suggest ACS but many other  chronic and acute conditions are known to elevate hsTnI results.  Refer to the Links section for chest pain algorithms and additional  guidance. Performed at [facility], [address] [place], [place] [address]   Hepatic function panel     Status: None   Collection Time: 09/23/19 11:49 AM  Result Value Ref Range   Total Protein 6.6 6.5 - 8.1 g/dL    Albumin 3.8 3.5 - 5.0 g/dL   AST 34 15 - 41 U/L   ALT 32 0 - 44 U/L   Alkaline Phosphatase 78 38 - 126 U/L   Total Bilirubin 0.6 0.3 - 1.2 mg/dL   Bilirubin, Direct 0.1 0.0 - 0.2 mg/dL   Indirect Bilirubin 0.5 0.3 - 0.9 mg/dL    Comment: Performed at [facility], [address] [place], [place] [address]  Brain natriuretic peptide     Status: None   Collection Time: 09/23/19 11:49 AM  Result Value Ref Range   B Natriuretic Peptide 54.8 0.0 - 100.0 pg/mL    Comment: Performed at [facility], [address] [place], [place] [address]    Radiology: DG Chest 2 View  Result Date: 09/23/2019 CLINICAL DATA:  Shortness of breath EXAM: CHEST - 2 VIEW COMPARISON:  November 10, 2018 FINDINGS: The heart size and mediastinal contours are within normal limits. Both  lungs are clear. The visualized skeletal structures are unremarkable. Hiatal hernia is identified unchanged. IMPRESSION: No active cardiopulmonary disease. Electronically Signed   By: Abelardo Diesel M.D.   On: 09/23/2019 12:24    No results found.  DG Chest 2 View  Result Date: 09/23/2019 CLINICAL DATA:  Shortness of breath EXAM: CHEST - 2 VIEW COMPARISON:  November 10, 2018 FINDINGS: The heart size and mediastinal contours are within normal limits. Both lungs are clear. The visualized skeletal structures are unremarkable. Hiatal hernia is identified unchanged. IMPRESSION: No active cardiopulmonary disease. Electronically Signed   By: Abelardo Diesel M.D.   On: 09/23/2019 12:24      Assessment and Plan: Patient Active Problem List   Diagnosis Date Noted  . Seasonal allergic rhinitis due to pollen 09/20/2019  . Callus of foot 08/13/2019  . Candidal vaginitis 08/13/2019  . Other symptoms and signs involving the nervous system 08/13/2019  . Encounter for general adult medical examination with abnormal findings 08/13/2019  . Restless leg syndrome 04/30/2019  . Type 2 diabetes mellitus with hyperglycemia ([place]) 04/02/2019   . Muscle cramps 04/02/2019  . Diastolic dysfunction 01/17/2535  . SOB (shortness of breath) 12/14/2018  . Hiatal hernia with GERD 12/14/2018  . Wheezing 12/14/2018  . Impaired fasting glucose 12/14/2018  . COPD with acute exacerbation ([place]) 09/28/2018  . Acute upper respiratory infection 08/25/2018  . Recurrent sinusitis 08/15/2018  . Prediabetes 08/15/2018  . Nonintractable headache 08/15/2018  . Dysuria 08/15/2018  . Intractable migraine without status migrainosus 04/22/2018  . Nausea 04/22/2018  . Polyneuropathy associated with underlying disease ([place]) 04/22/2018  . Midline low back pain without sciatica 04/22/2018  . Routine cervical smear 04/22/2018  . Essential hypertension 07/01/2017  . Acute bronchitis with asthma 07/01/2017  . Allergic rhinitis 07/01/2017  . Moderate asthma without complication 64/40/3474  . Severe recurrent major depression without psychotic features ([place]) 09/10/2014  . COPD (chronic obstructive pulmonary disease) ([place]) 09/10/2014  . Calculi, ureter 04/26/2013  . Corneal graft malfunction 08/17/2012  . Calculus of kidney 04/26/2012  . Renal colic 25/95/6387  . Urge incontinence 04/26/2012  . Diaphragmatic hernia 10/27/2010  . Barrett esophagus 07/07/2010    1. Moderate asthma without complication, unspecified whether persistent Stable, continue present management. Continue to use inhalers, good control at this time.   2. Seasonal allergic rhinitis due to pollen Some symptoms at this time.  Continue with present management.   3. Essential hypertension Stable, continue present management.   4. Gastroesophageal reflux disease without esophagitis Stable, continue famotidine as directed.   General Counseling: I have discussed the findings of the evaluation and examination with Summer Murphy.  I have also discussed any further diagnostic evaluation thatmay be needed or ordered today. Erykah verbalizes understanding of the findings of todays visit. We also  reviewed her medications today and discussed drug interactions and side effects including but not limited excessive drowsiness and altered mental states. We also discussed that there is always a risk not just to her but also people around her. she has been encouraged to call the office with any questions or concerns that should arise related to todays visit.  No orders of the defined types were placed in this encounter.    Time spent: 30 This patient was seen by Orson Gear AGNP-C in Collaboration with Dr. Devona Konig as a part of collaborative care agreement.   I have personally obtained a history, examined the patient, evaluated laboratory and imaging results, formulated the assessment and plan and placed orders.  Allyne Gee, MD [facility] Pulmonary and Critical Care Sleep medicine

## 2019-10-16 DIAGNOSIS — E1165 Type 2 diabetes mellitus with hyperglycemia: Secondary | ICD-10-CM | POA: Diagnosis not present

## 2019-10-16 DIAGNOSIS — M25562 Pain in left knee: Secondary | ICD-10-CM | POA: Diagnosis not present

## 2019-10-16 DIAGNOSIS — M25561 Pain in right knee: Secondary | ICD-10-CM | POA: Diagnosis not present

## 2019-10-16 DIAGNOSIS — G63 Polyneuropathy in diseases classified elsewhere: Secondary | ICD-10-CM | POA: Diagnosis not present

## 2019-10-16 DIAGNOSIS — S82044A Nondisplaced comminuted fracture of right patella, initial encounter for closed fracture: Secondary | ICD-10-CM | POA: Diagnosis not present

## 2019-10-23 ENCOUNTER — Other Ambulatory Visit: Payer: Self-pay

## 2019-10-23 DIAGNOSIS — T84032D Mechanical loosening of internal right knee prosthetic joint, subsequent encounter: Secondary | ICD-10-CM | POA: Diagnosis not present

## 2019-10-23 DIAGNOSIS — I10 Essential (primary) hypertension: Secondary | ICD-10-CM

## 2019-10-23 DIAGNOSIS — G2581 Restless legs syndrome: Secondary | ICD-10-CM

## 2019-10-23 MED ORDER — ROPINIROLE HCL 0.5 MG PO TABS: ORAL_TABLET | ORAL | 0 refills | Status: DC

## 2019-10-23 MED ORDER — DILTIAZEM HCL ER 180 MG PO CP24: 180.0000 mg | ORAL_CAPSULE | Freq: Every day | ORAL | 1 refills | Status: DC

## 2019-10-24 ENCOUNTER — Other Ambulatory Visit: Payer: Self-pay | Admitting: Orthopedic Surgery

## 2019-11-02 ENCOUNTER — Other Ambulatory Visit: Payer: Self-pay

## 2019-11-02 DIAGNOSIS — G43919 Migraine, unspecified, intractable, without status migrainosus: Secondary | ICD-10-CM

## 2019-11-02 MED ORDER — SUMATRIPTAN SUCCINATE 100 MG PO TABS: 100.0000 mg | ORAL_TABLET | ORAL | 3 refills | Status: DC | PRN

## 2019-11-07 ENCOUNTER — Other Ambulatory Visit: Payer: Self-pay

## 2019-11-07 ENCOUNTER — Encounter
Admission: RE | Admit: 2019-11-07 | Discharge: 2019-11-07 | Disposition: A | Discharge status: Home / Self Care | Payer: Medicare Other | Source: Ambulatory Visit | Attending: Orthopedic Surgery | Admitting: Orthopedic Surgery

## 2019-11-07 DIAGNOSIS — J4541 Moderate persistent asthma with (acute) exacerbation: Secondary | ICD-10-CM

## 2019-11-07 DIAGNOSIS — Z01812 Encounter for preprocedural laboratory examination: Secondary | ICD-10-CM | POA: Insufficient documentation

## 2019-11-07 HISTORY — DX: Personal history of urinary calculi: Z87.442

## 2019-11-07 HISTORY — DX: Hypoglycemia, unspecified: E16.2

## 2019-11-07 LAB — URINALYSIS, ROUTINE W REFLEX MICROSCOPIC
Bacteria, UA: NONE SEEN
Bilirubin Urine: NEGATIVE
Glucose, UA: NEGATIVE mg/dL
Hgb urine dipstick: NEGATIVE
Ketones, ur: NEGATIVE mg/dL
Leukocytes,Ua: NEGATIVE
Nitrite: NEGATIVE
Protein, ur: 30 mg/dL — AB
Specific Gravity, Urine: 1.027 (ref 1.005–1.030)
WBC, UA: NONE SEEN WBC/hpf (ref 0–5)
pH: 5 (ref 5.0–8.0)

## 2019-11-07 LAB — SURGICAL PCR SCREEN
MRSA, PCR: NEGATIVE
Staphylococcus aureus: NEGATIVE

## 2019-11-07 MED ORDER — COMBIVENT RESPIMAT 20-100 MCG/ACT IN AERS: INHALATION_SPRAY | RESPIRATORY_TRACT | 3 refills | Status: DC

## 2019-11-07 NOTE — Patient Instructions (Signed)
Your procedure is scheduled on: Tues 8/24 Report to Day Surgery. To find out your arrival time please call [phone] between 1PM - 3PM on Mon 8/23.  Remember: Instructions that are not followed completely may result in serious medical risk,  up to and including death, or upon the discretion of your surgeon and anesthesiologist your  surgery may need to be rescheduled.     _X__ 1. Do not eat food after midnight the night before your procedure.                 No chewing gum or hard candies. You may drink clear liquids up to 2 hours                 before you are scheduled to arrive for your surgery- DO not drink clear                 liquids within 2 hours of the start of your surgery.                 Clear Liquids include:  water, apple juice without pulp, clear Gatorade, G2 or                  Gatorade Zero (avoid Red/Purple/Blue), Black Coffee or Tea (Do not add                 anything to coffee or tea). __x___2.   Complete the "Ensure Clear Pre-surgery Clear Carbohydrate Drink" provided to you, 2 hours before arrival. **If you       are diabetic you will be provided with an alternative drink, Gatorade Zero or G2.  __X__2.  On the morning of surgery brush your teeth with toothpaste and water, you                may rinse your mouth with mouthwash if you wish.  Do not swallow any toothpaste of mouthwash.     ___ 3.  No Alcohol for 24 hours before or after surgery.   ___ 4.  Do Not Smoke or use e-cigarettes For 24 Hours Prior to Your Surgery.                 Do not use any chewable tobacco products for at least 6 hours prior to                 Surgery.  ___  5.  Do not use any recreational drugs (marijuana, cocaine, heroin, ecstasy, MDMA or other)                For at least one week prior to your surgery.  Combination of these drugs with anesthesia                May have life threatening results.  ____  6.  Bring all medications with you on the day of  surgery if instructed.   _x___  7.  Notify your doctor if there is any change in your medical condition      (cold, fever, infections).     Do not wear jewelry, make-up, hairpins, clips or nail polish. Do not wear lotions, powders, or perfumes. You may wear deodorant. Do not shave 48 hours prior to surgery.  Do not bring valuables to the hospital.    [facility] is not responsible for any belongings or valuables.  Contacts, dentures or bridgework may not be worn into surgery. Leave your suitcase in the car. After  surgery it may be brought to your room. For patients admitted to the hospital, discharge time is determined by your treatment team.   Patients discharged the day of surgery will not be allowed to drive home.   Make arrangements for someone to be with you for the first 24 hours of your Same Day Discharge.    Please read over the following fact sheets that you were given:    ___x_ Take these medicines the morning of surgery with A SIP OF WATER:    1. busPIRone (BUSPAR) 10 MG tablet  2. Brexpiprazole (REXULTI) 4 MG TABS  3. DULoxetine (CYMBALTA) 60 MG capsule  4.diltiazem (DILACOR XR) 180 MG 24 hr capsule  5.diphenhydrAMINE (BENADRYL) 25 MG tablet  6.gabapentin (NEURONTIN) 600 MG tablet              7.oxybutynin (DITROPAN) 5 MG tablet              8.rOPINIRole (REQUIP) 0.5 MG tablet              9.traMADol (ULTRAM) 50 MG tablet  If needed ____ Fleet Enema (as directed)   __x__ Use CHG Soap (or wipes) as directed  ____ Use Benzoyl Peroxide Gel as instructed  __x__ Use inhalers on the day of surgery budesonide-formoterol (SYMBICORT) 160-4.5 MCG/ACT inhaler,COMBIVENT RESPIMAT 20-100 MCG/ACT AERS respimat and bring with you,Tiotropium Bromide Monohydrate (SPIRIVA RESPIMAT) 1.25 MCG/ACT AERS    ____ Stop metformin 2 days prior to surgery    ____ Take 1/2 of usual insulin dose the night before surgery. No insulin the morning          of surgery.   ____ Stop  Coumadin/Plavix/aspirin on   __x_ Stop Anti-inflammatories no Ibuprofen or aleve   __x__ Stop supplements until after surgery.  BIOTIN PO  ____ Bring C-Pap to the hospital.    If you have any questions regarding your pre-procedure instructions,  Please call Pre-admit Testing at [phone]

## 2019-11-10 ENCOUNTER — Other Ambulatory Visit: Payer: Self-pay

## 2019-11-10 ENCOUNTER — Other Ambulatory Visit
Admission: RE | Admit: 2019-11-10 | Discharge: 2019-11-10 | Disposition: A | Discharge status: Home / Self Care | Payer: Medicare Other | Source: Ambulatory Visit | Attending: Orthopedic Surgery | Admitting: Orthopedic Surgery

## 2019-11-10 DIAGNOSIS — Z01812 Encounter for preprocedural laboratory examination: Secondary | ICD-10-CM | POA: Diagnosis not present

## 2019-11-10 DIAGNOSIS — Z20822 Contact with and (suspected) exposure to covid-19: Secondary | ICD-10-CM | POA: Insufficient documentation

## 2019-11-10 LAB — SARS CORONAVIRUS 2 (TAT 6-24 HRS): SARS Coronavirus 2: NEGATIVE

## 2019-11-13 NOTE — H&P (Signed)
Chief Complaint  Patient presents with  . Follow-up  Rt Knee painful hardware   History of the Present Illness: Summer Murphy is a 64 y.o. female here for follow-up of left total knee arthroplasty performed in 2013. 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 She is having a great deal of pain and swelling. Her previous x-rays show some bony overgrowth around the edges of the patella. She had an inset patella because her original patella was so thin.  I have reviewed past medical, surgical, social and family history, and allergies as documented in the EMR.  Past Medical History: Past Medical History:  Diagnosis Date  . Allergic state  . Arthritis  . Asthma without status asthmaticus, unspecified  . Chickenpox  . Depression  . Diabetes mellitus type 2, uncomplicated (CMS-HCC)  . GERD (gastroesophageal reflux disease)  . Hyperlipidemia  . Hypertension  . Insomnia  . Kidney stones  . Kidney stones  . Migraines  . Osteoporosis  . Rheumatoid arthritis(714.0) (CMS-HCC)  . Shingles   Past Surgical History: Past Surgical History:  Procedure Laterality Date  . APPENDECTOMY  . Arthroscopy, partial synovectomy, left knee Left 03/01/14  . CHOLECYSTECTOMY  . COLONOSCOPY 01/21/2001  Adenomatous Polyp  . COLONOSCOPY 01/15/2012, 06/10/2004  PH Adenomatous Polyp: CBF 12/2016; Recall Ltr mailed 12/04/2016 (dw)  . COLONOSCOPY 01/04/2017  Aborted d/t Poor Prep: FU GI OV made 02/25/2017 @ 2pm w/MJohnson PA (dw)  . EGD 04/17/1986, 03/08/1987, 06/12/1987, 09/16/1987, 03/18/1988, 03/07/1991, 01/21/2001, 06/10/2004, 11/04/2005, 06/08/2008, 07/12/2008, 05/06/2009, 07/03/2009, 09/18/2009, 06/29/2011, 11/10/2011, 01/15/2012  . EGD 01/16/2013  Barrett's Esophagus [facility])  . EGD 04/28/2016  Barrett's Esophagus [facility])  . EGD 01/04/2017  No Barrett's Seen: No repeat per RTE  . HYSTERECTOMY  . JOINT REPLACEMENT Right 10/29/2011  RIGHT TOTAL KNEE REPLACEMENT  .  Left total knee replacement Left 10/26/13  . Nissen fundoplication x2  . REDUCTION MAMMAPLASTY  . SIGMOIDOSCOPY FLEXIBLE 12/21/1985, 03/11/1987, 02/04/1990   Past Family History: Family History  Problem Relation Age of Onset  . Stroke Mother  . Diabetes Father  . Angina Father   Medications: Current Outpatient Medications Ordered in Epic  Medication Sig Dispense Refill  . COMBIVENT RESPIMAT 20-100 mcg/actuation inhaler 2 inhalations. Up to 3 times daily  . diclofenac (VOLTAREN) 1 % topical gel Apply 2 g topically 3 (three) times daily 100 g 1  . diltiazem (DILT-XR) 180 MG XR capsule Take 1 capsule by mouth once daily.  . diphenoxylate-atropine (LOMOTIL) 2.5-0.025 mg tablet Take 1 tablet by mouth 4 (four) times daily as needed for Diarrhea 30 tablet 2  . DULoxetine (CYMBALTA) 60 MG DR capsule  . gabapentin (NEURONTIN) 600 MG tablet  . montelukast (SINGULAIR) 10 mg tablet Take 10 mg by mouth once daily as needed.  . ondansetron (ZOFRAN) 4 MG tablet Take 8 mg by mouth every 6 (six) hours as needed for Nausea.  Marland Kitchen oxybutynin (DITROPAN) 5 mg tablet  . pantoprazole (PROTONIX) 40 MG DR tablet Take 1 tablet (40 mg total) by mouth 2 (two) times daily Take 1 tablet 30 mins before breakfast and 1 tablet 30 mins before dinner. 180 tablet 0  . REXULTI 4 mg Tab Take 1 tablet by mouth every morning  . SPIRIVA RESPIMAT 2.5 mcg/actuation inhalation spray INHALE 1 PUFF ITL BID  . SYMBICORT 160-4.5 mcg/actuation inhaler 1 spray 2 (two) times daily.  . traMADoL (ULTRAM) 50 mg tablet Take 1 tablet (50 mg total) by mouth every 6 (six)  hours as needed for Pain for up to 5 days 30 tablet 1   No current Epic-ordered facility-administered medications on file.   Allergies: Allergies  Allergen Reactions  . Metoclopramide Other (See Comments) and Hives  hallucinations hallucinations hallucinations  . Nsaids (Non-Steroidal Anti-Inflammatory Drug) Other (See Comments)  Other Reaction: Allergy  . Aspirin  Unknown  . Azithromycin Unknown  . Chlordiazepoxide Unknown  . Iron Unknown  . Metoclopramide Unknown  . Morphine Other (See Comments)  Unsure of reaction Unsure of reaction  . Reglan [Metoclopramide Hcl] Hallucination  . Sulfa (Sulfonamide Antibiotics) Unknown  . Amlodipine Besylate Rash  Rash , itching , arms, stomach and forehead Rash , itching , arms, stomach and forehead    Body mass index is 31.67 kg/m.  Review of Systems: A comprehensive 14 point ROS was performed, reviewed, and the pertinent orthopaedic findings are documented in the HPI.  Vitals:  10/23/19 1039  BP: 142/78   General Physical Examination:  General/Constitutional: No apparent distress: well-nourished and well developed. Eyes: Pupils equal, round with synchronous movement. Lungs: Clear to auscultation HEENT: Normal Vascular: No edema, swelling or tenderness, except as noted in detailed exam. Cardiac: Heart rate and rhythm is regular. Integumentary: No impressive skin lesions present, except as noted in detailed exam. Neuro/Psych: Normal mood and affect, oriented to person, place and time.  Musculoskeletal Examination: On exam, left total knee range of motion today was passive 20 to 90 degrees, active 30 to 90 degrees. She has always had an extensor lag. Her skin is intact distally. Neurovascularly intact. Lungs are clear. Heart rate and rhythm is normal. HEENT is poor dentition, nothing loose or removable.   Radiographs: No new imaging studies were obtained or reviewed today.  Assessment: ICD-10-CM  1. Loosening of prosthesis of right total knee replacement, subsequent encounter T84.032D   Plan: The patient has clinical findings of loose patellar component status post right total knee arthroplasty.  I suspect she has a loose patellar component and will need that revised. I will probably leave that out completely just to rely on scar tissue. Also, we will put a prophylactic suture around the  patella, and use a suture anchor to reinforce the patellar tendon to avoid extensor mechanism rupture. Additionally, she has slight varus and valgus instability and may end up changing out her polyethylene component from her tibia since it is 64 years old and may have wear.   We will plan for revision surgery sometime this month for patellar component and probably tibial polyethylene.  Surgical Risks:  The nature of the condition and the proposed procedure has been reviewed in detail with the patient. Surgical versus non-surgical options and prognosis for recovery have been reviewed and the inherent risks and benefits of each have been discussed including the risks of infection, bleeding, injury to nerves/blood vessels/tendons, incomplete relief of symptoms, persisting pain and/or stiffness, loss of function, complex regional pain syndrome, failure of the procedure, as appropriate.  Teeth: Poor dentition, nothing loose or removable.  Scribe Attestation: I, Dawn Royse, am acting as scribe for [employer], MD    Electronically signed by Lauris Poag, MD at 10/24/2019 7:26 PM EDT  Reviewed paper H+P, will be scanned into chart. No changes noted.

## 2019-11-14 ENCOUNTER — Other Ambulatory Visit: Payer: Self-pay

## 2019-11-14 ENCOUNTER — Encounter: Payer: Self-pay | Admitting: Orthopedic Surgery

## 2019-11-14 ENCOUNTER — Observation Stay: Payer: Medicare Other

## 2019-11-14 ENCOUNTER — Inpatient Hospital Stay: Payer: Medicare Other | Admitting: Anesthesiology

## 2019-11-14 ENCOUNTER — Encounter
Admission: RE | Disposition: A | Discharge status: Home / Self Care | Payer: Self-pay | Source: Home / Self Care | Attending: Orthopedic Surgery

## 2019-11-14 ENCOUNTER — Inpatient Hospital Stay
Admission: RE | Admit: 2019-11-14 | Discharge: 2019-11-17 | DRG: 488 | Disposition: A | Discharge status: Home / Self Care | Payer: Medicare Other | Attending: Orthopedic Surgery | Admitting: Orthopedic Surgery

## 2019-11-14 DIAGNOSIS — Z87442 Personal history of urinary calculi: Secondary | ICD-10-CM | POA: Diagnosis not present

## 2019-11-14 DIAGNOSIS — T84032A Mechanical loosening of internal right knee prosthetic joint, initial encounter: Secondary | ICD-10-CM | POA: Diagnosis not present

## 2019-11-14 DIAGNOSIS — M069 Rheumatoid arthritis, unspecified: Secondary | ICD-10-CM | POA: Diagnosis present

## 2019-11-14 DIAGNOSIS — Z90710 Acquired absence of both cervix and uterus: Secondary | ICD-10-CM | POA: Diagnosis not present

## 2019-11-14 DIAGNOSIS — E119 Type 2 diabetes mellitus without complications: Secondary | ICD-10-CM | POA: Diagnosis not present

## 2019-11-14 DIAGNOSIS — Z96651 Presence of right artificial knee joint: Secondary | ICD-10-CM | POA: Diagnosis not present

## 2019-11-14 DIAGNOSIS — Z79899 Other long term (current) drug therapy: Secondary | ICD-10-CM

## 2019-11-14 DIAGNOSIS — E871 Hypo-osmolality and hyponatremia: Secondary | ICD-10-CM | POA: Diagnosis not present

## 2019-11-14 DIAGNOSIS — R110 Nausea: Secondary | ICD-10-CM | POA: Diagnosis not present

## 2019-11-14 DIAGNOSIS — Z823 Family history of stroke: Secondary | ICD-10-CM

## 2019-11-14 DIAGNOSIS — M810 Age-related osteoporosis without current pathological fracture: Secondary | ICD-10-CM | POA: Diagnosis not present

## 2019-11-14 DIAGNOSIS — E785 Hyperlipidemia, unspecified: Secondary | ICD-10-CM | POA: Diagnosis not present

## 2019-11-14 DIAGNOSIS — Z96653 Presence of artificial knee joint, bilateral: Secondary | ICD-10-CM | POA: Diagnosis present

## 2019-11-14 DIAGNOSIS — T84032D Mechanical loosening of internal right knee prosthetic joint, subsequent encounter: Secondary | ICD-10-CM | POA: Diagnosis not present

## 2019-11-14 DIAGNOSIS — E1165 Type 2 diabetes mellitus with hyperglycemia: Secondary | ICD-10-CM | POA: Diagnosis not present

## 2019-11-14 DIAGNOSIS — Z833 Family history of diabetes mellitus: Secondary | ICD-10-CM

## 2019-11-14 DIAGNOSIS — Y831 Surgical operation with implant of artificial internal device as the cause of abnormal reaction of the patient, or of later complication, without mention of misadventure at the time of the procedure: Secondary | ICD-10-CM | POA: Diagnosis present

## 2019-11-14 DIAGNOSIS — Z9889 Other specified postprocedural states: Secondary | ICD-10-CM | POA: Diagnosis not present

## 2019-11-14 DIAGNOSIS — G8918 Other acute postprocedural pain: Secondary | ICD-10-CM

## 2019-11-14 DIAGNOSIS — I10 Essential (primary) hypertension: Secondary | ICD-10-CM | POA: Diagnosis not present

## 2019-11-14 DIAGNOSIS — K2270 Barrett's esophagus without dysplasia: Secondary | ICD-10-CM | POA: Diagnosis present

## 2019-11-14 DIAGNOSIS — T8484XA Pain due to internal orthopedic prosthetic devices, implants and grafts, initial encounter: Secondary | ICD-10-CM | POA: Diagnosis not present

## 2019-11-14 DIAGNOSIS — Z20822 Contact with and (suspected) exposure to covid-19: Secondary | ICD-10-CM | POA: Diagnosis present

## 2019-11-14 DIAGNOSIS — J441 Chronic obstructive pulmonary disease with (acute) exacerbation: Secondary | ICD-10-CM | POA: Diagnosis not present

## 2019-11-14 DIAGNOSIS — J45909 Unspecified asthma, uncomplicated: Secondary | ICD-10-CM | POA: Diagnosis not present

## 2019-11-14 DIAGNOSIS — Z6830 Body mass index (BMI) 30.0-30.9, adult: Secondary | ICD-10-CM | POA: Diagnosis not present

## 2019-11-14 DIAGNOSIS — Z471 Aftercare following joint replacement surgery: Secondary | ICD-10-CM | POA: Diagnosis not present

## 2019-11-14 HISTORY — PX: TOTAL KNEE REVISION: SHX996

## 2019-11-14 LAB — CBC
HCT: 40.6 % (ref 36.0–46.0)
Hemoglobin: 13.5 g/dL (ref 12.0–15.0)
MCH: 30 pg (ref 26.0–34.0)
MCHC: 33.3 g/dL (ref 30.0–36.0)
MCV: 90.2 fL (ref 80.0–100.0)
Platelets: 195 10*3/uL (ref 150–400)
RBC: 4.5 MIL/uL (ref 3.87–5.11)
RDW: 12.8 % (ref 11.5–15.5)
WBC: 13.2 10*3/uL — ABNORMAL HIGH (ref 4.0–10.5)
nRBC: 0 % (ref 0.0–0.2)

## 2019-11-14 LAB — CREATININE, SERUM
Creatinine, Ser: 0.67 mg/dL (ref 0.44–1.00)
GFR calc Af Amer: >60 mL/min (ref >60)
GFR calc non Af Amer: >60 mL/min (ref >60)

## 2019-11-14 LAB — ABO/RH: ABO/RH(D): A POS

## 2019-11-14 SURGERY — TOTAL KNEE REVISION
Anesthesia: Spinal | Site: Knee | Laterality: Right

## 2019-11-14 MED ORDER — METHOCARBAMOL 500 MG PO TABS
500.0000 mg | ORAL_TABLET | Freq: Four times a day (QID) | ORAL | Status: DC | PRN
  Administered 2019-11-14 – 2019-11-16 (×3): 500 mg via ORAL
  Filled 2019-11-14 (×3): qty 1

## 2019-11-14 MED ORDER — FAMOTIDINE 20 MG PO TABS
ORAL_TABLET | ORAL | Status: AC
  Administered 2019-11-14: 20 mg via ORAL
  Filled 2019-11-14: qty 1

## 2019-11-14 MED ORDER — FENTANYL CITRATE (PF) 100 MCG/2ML IJ SOLN
INTRAMUSCULAR | Status: DC | PRN
  Administered 2019-11-14: 50 ug via INTRAVENOUS

## 2019-11-14 MED ORDER — OXYBUTYNIN CHLORIDE 5 MG PO TABS
5.0000 mg | ORAL_TABLET | Freq: Two times a day (BID) | ORAL | Status: DC
  Administered 2019-11-14 – 2019-11-17 (×6): 5 mg via ORAL
  Filled 2019-11-14 (×7): qty 1

## 2019-11-14 MED ORDER — MAGNESIUM CITRATE PO SOLN
1.0000 | Freq: Once | ORAL | Status: AC | PRN
  Administered 2019-11-17: 1 via ORAL
  Filled 2019-11-14 (×2): qty 296

## 2019-11-14 MED ORDER — METHOCARBAMOL 1000 MG/10ML IJ SOLN
500.0000 mg | Freq: Four times a day (QID) | INTRAVENOUS | Status: DC | PRN
  Filled 2019-11-14: qty 5

## 2019-11-14 MED ORDER — CHLORHEXIDINE GLUCONATE 0.12 % MT SOLN
OROMUCOSAL | Status: AC
  Administered 2019-11-14: 15 mL via OROMUCOSAL
  Filled 2019-11-14: qty 15

## 2019-11-14 MED ORDER — MAGNESIUM HYDROXIDE 400 MG/5ML PO SUSP
30.0000 mL | Freq: Every day | ORAL | Status: DC | PRN
  Administered 2019-11-15 – 2019-11-17 (×2): 30 mL via ORAL
  Filled 2019-11-14 (×3): qty 30

## 2019-11-14 MED ORDER — FLUTICASONE FUROATE-VILANTEROL 200-25 MCG/INH IN AEPB
1.0000 | INHALATION_SPRAY | Freq: Every day | RESPIRATORY_TRACT | Status: DC
  Administered 2019-11-14 – 2019-11-17 (×4): 1 via RESPIRATORY_TRACT
  Filled 2019-11-14: qty 28

## 2019-11-14 MED ORDER — DIPHENOXYLATE-ATROPINE 2.5-0.025 MG PO TABS: 1.0000 | ORAL_TABLET | Freq: Three times a day (TID) | ORAL | Status: DC | PRN

## 2019-11-14 MED ORDER — SODIUM CHLORIDE 0.9 % IV SOLN
INTRAVENOUS | Status: DC | PRN
  Administered 2019-11-14: 50 ug/min via INTRAVENOUS

## 2019-11-14 MED ORDER — PHENOL 1.4 % MT LIQD
1.0000 | OROMUCOSAL | Status: DC | PRN
  Filled 2019-11-14: qty 177

## 2019-11-14 MED ORDER — BREXPIPRAZOLE 1 MG PO TABS
4.0000 mg | ORAL_TABLET | Freq: Every day | ORAL | Status: DC
  Administered 2019-11-15 – 2019-11-17 (×3): 4 mg via ORAL
  Filled 2019-11-14 (×2): qty 4
  Filled 2019-11-14: qty 2
  Filled 2019-11-14: qty 4

## 2019-11-14 MED ORDER — IPRATROPIUM-ALBUTEROL 0.5-2.5 (3) MG/3ML IN SOLN: 3.0000 mL | RESPIRATORY_TRACT | Status: DC | PRN

## 2019-11-14 MED ORDER — GABAPENTIN 300 MG PO CAPS
600.0000 mg | ORAL_CAPSULE | Freq: Two times a day (BID) | ORAL | Status: DC
  Administered 2019-11-14 – 2019-11-17 (×6): 600 mg via ORAL
  Filled 2019-11-14 (×6): qty 2

## 2019-11-14 MED ORDER — IPRATROPIUM-ALBUTEROL 0.5-2.5 (3) MG/3ML IN SOLN
RESPIRATORY_TRACT | Status: AC
  Filled 2019-11-14: qty 3

## 2019-11-14 MED ORDER — TIOTROPIUM BROMIDE MONOHYDRATE 18 MCG IN CAPS
1.0000 | ORAL_CAPSULE | Freq: Every day | RESPIRATORY_TRACT | Status: DC
  Administered 2019-11-15 – 2019-11-17 (×3): 18 ug via RESPIRATORY_TRACT
  Filled 2019-11-14: qty 5

## 2019-11-14 MED ORDER — MENTHOL 3 MG MT LOZG
1.0000 | LOZENGE | OROMUCOSAL | Status: DC | PRN
  Filled 2019-11-14: qty 9

## 2019-11-14 MED ORDER — LORAZEPAM 1 MG PO TABS: 1.0000 mg | ORAL_TABLET | Freq: Every evening | ORAL | Status: DC | PRN

## 2019-11-14 MED ORDER — SODIUM CHLORIDE 0.9 % IV SOLN: INTRAVENOUS | Status: DC

## 2019-11-14 MED ORDER — ACETAMINOPHEN 325 MG PO TABS
325.0000 mg | ORAL_TABLET | Freq: Four times a day (QID) | ORAL | Status: DC | PRN
  Administered 2019-11-15 – 2019-11-17 (×3): 650 mg via ORAL
  Filled 2019-11-14 (×3): qty 2

## 2019-11-14 MED ORDER — ONDANSETRON HCL 4 MG/2ML IJ SOLN
4.0000 mg | Freq: Four times a day (QID) | INTRAMUSCULAR | Status: DC | PRN
  Administered 2019-11-15 (×2): 4 mg via INTRAVENOUS
  Filled 2019-11-14 (×2): qty 2

## 2019-11-14 MED ORDER — BUPIVACAINE-EPINEPHRINE 0.25% -1:200000 IJ SOLN
INTRAMUSCULAR | Status: DC | PRN
  Administered 2019-11-14: 30 mL

## 2019-11-14 MED ORDER — CHLORHEXIDINE GLUCONATE 0.12 % MT SOLN: 15.0000 mL | Freq: Once | OROMUCOSAL | Status: AC

## 2019-11-14 MED ORDER — SURGIRINSE WOUND IRRIGATION SYSTEM - OPTIME
TOPICAL | Status: DC | PRN
  Administered 2019-11-14: 900 mL

## 2019-11-14 MED ORDER — IPRATROPIUM-ALBUTEROL 0.5-2.5 (3) MG/3ML IN SOLN
3.0000 mL | RESPIRATORY_TRACT | Status: DC
  Administered 2019-11-14: 3 mL via RESPIRATORY_TRACT

## 2019-11-14 MED ORDER — TRAMADOL HCL 50 MG PO TABS
50.0000 mg | ORAL_TABLET | Freq: Four times a day (QID) | ORAL | Status: DC
  Administered 2019-11-14 – 2019-11-17 (×10): 50 mg via ORAL
  Filled 2019-11-14 (×10): qty 1

## 2019-11-14 MED ORDER — ONDANSETRON HCL 4 MG PO TABS
4.0000 mg | ORAL_TABLET | Freq: Four times a day (QID) | ORAL | Status: DC | PRN
  Administered 2019-11-15: 4 mg via ORAL
  Filled 2019-11-14: qty 1

## 2019-11-14 MED ORDER — FENTANYL CITRATE (PF) 100 MCG/2ML IJ SOLN
INTRAMUSCULAR | Status: AC
  Filled 2019-11-14: qty 2

## 2019-11-14 MED ORDER — HYDROCODONE-ACETAMINOPHEN 7.5-325 MG PO TABS
1.0000 | ORAL_TABLET | ORAL | Status: DC | PRN
  Administered 2019-11-14 – 2019-11-15 (×4): 2 via ORAL
  Filled 2019-11-14 (×4): qty 2

## 2019-11-14 MED ORDER — PROPOFOL 500 MG/50ML IV EMUL
INTRAVENOUS | Status: DC | PRN
  Administered 2019-11-14: 75 ug/kg/min via INTRAVENOUS

## 2019-11-14 MED ORDER — DULOXETINE HCL 60 MG PO CPEP
60.0000 mg | ORAL_CAPSULE | Freq: Every day | ORAL | Status: DC
  Administered 2019-11-15 – 2019-11-17 (×3): 60 mg via ORAL
  Filled 2019-11-14 (×3): qty 1

## 2019-11-14 MED ORDER — TRAZODONE HCL 100 MG PO TABS
150.0000 mg | ORAL_TABLET | Freq: Every day | ORAL | Status: DC
  Administered 2019-11-14 – 2019-11-16 (×3): 150 mg via ORAL
  Filled 2019-11-14 (×3): qty 2

## 2019-11-14 MED ORDER — LACTATED RINGERS IV SOLN: INTRAVENOUS | Status: DC

## 2019-11-14 MED ORDER — TRANEXAMIC ACID-NACL 1000-0.7 MG/100ML-% IV SOLN
1000.0000 mg | Freq: Once | INTRAVENOUS | Status: AC
  Administered 2019-11-14: 1000 mg via INTRAVENOUS

## 2019-11-14 MED ORDER — BUPIVACAINE LIPOSOME 1.3 % IJ SUSP
INTRAMUSCULAR | Status: DC | PRN
  Administered 2019-11-14: 20 mL

## 2019-11-14 MED ORDER — PROPOFOL 500 MG/50ML IV EMUL
INTRAVENOUS | Status: AC
  Filled 2019-11-14: qty 50

## 2019-11-14 MED ORDER — ORAL CARE MOUTH RINSE: 15.0000 mL | Freq: Once | OROMUCOSAL | Status: AC

## 2019-11-14 MED ORDER — TRANEXAMIC ACID-NACL 1000-0.7 MG/100ML-% IV SOLN
INTRAVENOUS | Status: AC
  Filled 2019-11-14: qty 100

## 2019-11-14 MED ORDER — NEOMYCIN-POLYMYXIN B GU 40-200000 IR SOLN
Status: DC | PRN
  Administered 2019-11-14: 16 mL

## 2019-11-14 MED ORDER — CEFAZOLIN SODIUM-DEXTROSE 2-4 GM/100ML-% IV SOLN
INTRAVENOUS | Status: AC
  Filled 2019-11-14: qty 100

## 2019-11-14 MED ORDER — LORATADINE 10 MG PO TABS
10.0000 mg | ORAL_TABLET | Freq: Every day | ORAL | Status: DC
  Administered 2019-11-15 – 2019-11-17 (×3): 10 mg via ORAL
  Filled 2019-11-14 (×3): qty 1

## 2019-11-14 MED ORDER — FAMOTIDINE 20 MG PO TABS: 20.0000 mg | ORAL_TABLET | Freq: Once | ORAL | Status: AC

## 2019-11-14 MED ORDER — DILTIAZEM HCL ER COATED BEADS 180 MG PO CP24
180.0000 mg | ORAL_CAPSULE | Freq: Every day | ORAL | Status: DC
  Administered 2019-11-15 – 2019-11-17 (×3): 180 mg via ORAL
  Filled 2019-11-14 (×3): qty 1

## 2019-11-14 MED ORDER — IPRATROPIUM-ALBUTEROL 0.5-2.5 (3) MG/3ML IN SOLN
3.0000 mL | Freq: Four times a day (QID) | RESPIRATORY_TRACT | Status: DC
  Administered 2019-11-14 – 2019-11-16 (×6): 3 mL via RESPIRATORY_TRACT
  Filled 2019-11-14 (×6): qty 3

## 2019-11-14 MED ORDER — FENTANYL CITRATE (PF) 100 MCG/2ML IJ SOLN
25.0000 ug | INTRAMUSCULAR | Status: DC | PRN
  Administered 2019-11-14 (×2): 25 ug via INTRAVENOUS

## 2019-11-14 MED ORDER — HYDROCODONE-ACETAMINOPHEN 5-325 MG PO TABS: 1.0000 | ORAL_TABLET | ORAL | Status: DC | PRN

## 2019-11-14 MED ORDER — BISACODYL 10 MG RE SUPP
10.0000 mg | Freq: Every day | RECTAL | Status: DC | PRN
  Filled 2019-11-14: qty 1

## 2019-11-14 MED ORDER — ONDANSETRON HCL 4 MG/2ML IJ SOLN
INTRAMUSCULAR | Status: AC
  Administered 2019-11-14: 4 mg
  Filled 2019-11-14: qty 2

## 2019-11-14 MED ORDER — BUPIVACAINE HCL (PF) 0.5 % IJ SOLN
INTRAMUSCULAR | Status: DC | PRN
  Administered 2019-11-14: 3 mL

## 2019-11-14 MED ORDER — VALACYCLOVIR HCL 500 MG PO TABS
500.0000 mg | ORAL_TABLET | Freq: Every day | ORAL | Status: DC | PRN
  Filled 2019-11-14: qty 1

## 2019-11-14 MED ORDER — PROPOFOL 10 MG/ML IV BOLUS
INTRAVENOUS | Status: DC | PRN
  Administered 2019-11-14: 50 mg via INTRAVENOUS

## 2019-11-14 MED ORDER — BUSPIRONE HCL 10 MG PO TABS
10.0000 mg | ORAL_TABLET | Freq: Two times a day (BID) | ORAL | Status: DC
  Administered 2019-11-14 – 2019-11-17 (×6): 10 mg via ORAL
  Filled 2019-11-14 (×6): qty 1

## 2019-11-14 MED ORDER — ROPINIROLE HCL 1 MG PO TABS
1.0000 mg | ORAL_TABLET | Freq: Two times a day (BID) | ORAL | Status: DC
  Administered 2019-11-14 – 2019-11-17 (×6): 1 mg via ORAL
  Filled 2019-11-14 (×6): qty 1

## 2019-11-14 MED ORDER — DOCUSATE SODIUM 100 MG PO CAPS
100.0000 mg | ORAL_CAPSULE | Freq: Two times a day (BID) | ORAL | Status: DC
  Administered 2019-11-14 – 2019-11-17 (×6): 100 mg via ORAL
  Filled 2019-11-14 (×6): qty 1

## 2019-11-14 MED ORDER — CEFAZOLIN SODIUM-DEXTROSE 2-4 GM/100ML-% IV SOLN
2.0000 g | Freq: Four times a day (QID) | INTRAVENOUS | Status: AC
  Administered 2019-11-14: 2 g via INTRAVENOUS
  Filled 2019-11-14: qty 100

## 2019-11-14 MED ORDER — ONDANSETRON HCL 4 MG/2ML IJ SOLN: 4.0000 mg | Freq: Once | INTRAMUSCULAR | Status: DC | PRN

## 2019-11-14 MED ORDER — CEFAZOLIN SODIUM-DEXTROSE 2-4 GM/100ML-% IV SOLN
2.0000 g | INTRAVENOUS | Status: AC
  Administered 2019-11-14: 2 g via INTRAVENOUS

## 2019-11-14 MED ORDER — ENOXAPARIN SODIUM 30 MG/0.3ML ~~LOC~~ SOLN
30.0000 mg | Freq: Two times a day (BID) | SUBCUTANEOUS | Status: DC
  Administered 2019-11-15 – 2019-11-17 (×5): 30 mg via SUBCUTANEOUS
  Filled 2019-11-14 (×5): qty 0.3

## 2019-11-14 MED ORDER — PANTOPRAZOLE SODIUM 40 MG PO TBEC
40.0000 mg | DELAYED_RELEASE_TABLET | Freq: Every day | ORAL | Status: DC
  Administered 2019-11-15 – 2019-11-17 (×3): 40 mg via ORAL
  Filled 2019-11-14 (×3): qty 1

## 2019-11-14 MED ORDER — SUMATRIPTAN SUCCINATE 50 MG PO TABS
100.0000 mg | ORAL_TABLET | ORAL | Status: DC | PRN
  Filled 2019-11-14: qty 2

## 2019-11-14 MED ORDER — FENTANYL CITRATE (PF) 100 MCG/2ML IJ SOLN
INTRAMUSCULAR | Status: AC
Start: 2019-11-14 — End: 2019-11-15
  Filled 2019-11-14: qty 2

## 2019-11-14 MED ORDER — CHLORHEXIDINE GLUCONATE CLOTH 2 % EX PADS
6.0000 | MEDICATED_PAD | Freq: Every day | CUTANEOUS | Status: DC
  Administered 2019-11-14 – 2019-11-17 (×2): 6 via TOPICAL

## 2019-11-14 SURGICAL SUPPLY — 72 items
APL PRP STRL LF DISP 70% ISPRP (MISCELLANEOUS) ×2
BLADE SAW 90X13X1.19 OSCILLAT (BLADE) ×2 IMPLANT
BLADE SAW 90X25X1.19 OSCILLAT (BLADE) ×1 IMPLANT
BNDG ELASTIC 6X5.8 VLCR STR LF (GAUZE/BANDAGES/DRESSINGS) ×2 IMPLANT
CANISTER SUCT 1200ML W/VALVE (MISCELLANEOUS) ×2 IMPLANT
CANISTER SUCT 3000ML PPV (MISCELLANEOUS) ×4 IMPLANT
CANISTER WOUND CARE 500ML ATS (WOUND CARE) ×2 IMPLANT
CHLORAPREP W/TINT 26 (MISCELLANEOUS) ×4 IMPLANT
COOLER POLAR GLACIER W/PUMP (MISCELLANEOUS) ×2 IMPLANT
COVER BACK TABLE REUSABLE LG (DRAPES) ×2 IMPLANT
COVER WAND RF STERILE (DRAPES) ×2 IMPLANT
CUFF TOURN SGL QUICK 24 (TOURNIQUET CUFF)
CUFF TOURN SGL QUICK 30 (TOURNIQUET CUFF)
CUFF TRNQT CYL 24X4X16.5-23 (TOURNIQUET CUFF) IMPLANT
CUFF TRNQT CYL 30X4X21-28X (TOURNIQUET CUFF) IMPLANT
DRAPE 3/4 80X56 (DRAPES) ×8 IMPLANT
ELECT CAUTERY BLADE 6.4 (BLADE) ×2 IMPLANT
ELECT REM PT RETURN 9FT ADLT (ELECTROSURGICAL) ×2
ELECTRODE REM PT RTRN 9FT ADLT (ELECTROSURGICAL) ×1 IMPLANT
GAUZE SPONGE 4X4 12PLY STRL (GAUZE/BANDAGES/DRESSINGS) ×2 IMPLANT
GAUZE XEROFORM 1X8 LF (GAUZE/BANDAGES/DRESSINGS) ×2 IMPLANT
GLOVE BIOGEL PI IND STRL 9 (GLOVE) ×1 IMPLANT
GLOVE BIOGEL PI INDICATOR 9 (GLOVE) ×1
GLOVE INDICATOR 8.0 STRL GRN (GLOVE) ×2 IMPLANT
GLOVE SURG ORTHO 8.0 STRL STRW (GLOVE) ×2 IMPLANT
GLOVE SURG SYN 9.0  PF PI (GLOVE) ×2
GLOVE SURG SYN 9.0 PF PI (GLOVE) ×1 IMPLANT
GOWN SRG 2XL LVL 4 RGLN SLV (GOWNS) ×1 IMPLANT
GOWN STRL NON-REIN 2XL LVL4 (GOWNS) ×2
GOWN STRL REUS W/ TWL LRG LVL3 (GOWN DISPOSABLE) ×1 IMPLANT
GOWN STRL REUS W/ TWL XL LVL3 (GOWN DISPOSABLE) ×1 IMPLANT
GOWN STRL REUS W/TWL LRG LVL3 (GOWN DISPOSABLE) ×2
GOWN STRL REUS W/TWL XL LVL3 (GOWN DISPOSABLE) ×2
HOLDER FOLEY CATH W/STRAP (MISCELLANEOUS) ×2 IMPLANT
HOOD PEEL AWAY FLYTE STAYCOOL (MISCELLANEOUS) ×4 IMPLANT
IRRIGATION SURGIPHOR STRL (IV SOLUTION) ×1 IMPLANT
KIT PREVENA INCISION MGT20CM45 (CANNISTER) ×2 IMPLANT
KIT TURNOVER KIT A (KITS) ×2 IMPLANT
KNIFE SCULPS 14X20 (INSTRUMENTS) ×2 IMPLANT
NDL MAYO CATGUT SZ5 (NEEDLE) ×2
NDL SPNL 18GX3.5 QUINCKE PK (NEEDLE) ×1 IMPLANT
NDL SPNL 20GX3.5 QUINCKE YW (NEEDLE) ×1 IMPLANT
NDL SUT 5 .5 CRC TROC PNT MAYO (NEEDLE) IMPLANT
NEEDLE SPNL 18GX3.5 QUINCKE PK (NEEDLE) ×2 IMPLANT
NEEDLE SPNL 20GX3.5 QUINCKE YW (NEEDLE) ×2 IMPLANT
NS IRRIG 1000ML POUR BTL (IV SOLUTION) ×2 IMPLANT
PACK TOTAL KNEE (MISCELLANEOUS) ×2 IMPLANT
PAD ABD DERMACEA PRESS 5X9 (GAUZE/BANDAGES/DRESSINGS) ×2 IMPLANT
PAD WRAPON POLAR KNEE (MISCELLANEOUS) ×2 IMPLANT
PULSAVAC PLUS IRRIG FAN TIP (DISPOSABLE) ×2
SCALPEL PROTECTED #10 DISP (BLADE) ×4 IMPLANT
SOL .9 NS 3000ML IRR  AL (IV SOLUTION) ×2
SOL .9 NS 3000ML IRR AL (IV SOLUTION) ×1
SOL .9 NS 3000ML IRR UROMATIC (IV SOLUTION) ×1 IMPLANT
STAPLER SKIN PROX 35W (STAPLE) ×2 IMPLANT
SUCTION FRAZIER HANDLE 10FR (MISCELLANEOUS) ×2
SUCTION TUBE FRAZIER 10FR DISP (MISCELLANEOUS) ×1 IMPLANT
SUT DVC 2 QUILL PDO  T11 36X36 (SUTURE) ×2
SUT DVC 2 QUILL PDO T11 36X36 (SUTURE) ×1 IMPLANT
SUT TICRON 2-0 30IN 311381 (SUTURE) IMPLANT
SUT V-LOC 90 ABS DVC 3-0 CL (SUTURE) ×2 IMPLANT
SWAB CULTURE AMIES ANAERIB BLU (MISCELLANEOUS) IMPLANT
SYR 20ML LL LF (SYRINGE) ×2 IMPLANT
SYR 50ML LL SCALE MARK (SYRINGE) ×4 IMPLANT
SYS INTERNAL BRACE KNEE (Miscellaneous) ×2 IMPLANT
SYSTEM INTERNAL BRACE KNEE (Miscellaneous) IMPLANT
TIB INSERT FIXED SZ2 14MM 0207 (Insert) ×1 IMPLANT
TIP FAN IRRIG PULSAVAC PLUS (DISPOSABLE) ×1 IMPLANT
TOWEL OR 17X26 4PK STRL BLUE (TOWEL DISPOSABLE) ×2 IMPLANT
TOWER CARTRIDGE SMART MIX (DISPOSABLE) ×2 IMPLANT
TRAY FOLEY MTR SLVR 16FR STAT (SET/KITS/TRAYS/PACK) ×2 IMPLANT
WRAPON POLAR PAD KNEE (MISCELLANEOUS) ×4

## 2019-11-14 NOTE — Anesthesia Procedure Notes (Signed)
Spinal  Patient location during procedure: OR Start time: 11/14/2019 7:25 AM End time: 11/14/2019 7:27 AM Staffing Performed: resident/CRNA  Resident/CRNA: Nelda Marseille, CRNA Preanesthetic Checklist Completed: patient identified, IV checked, site marked, risks and benefits discussed, surgical consent, monitors and equipment checked, pre-op evaluation and timeout performed Spinal Block Patient position: sitting Prep: Betadine Patient monitoring: heart rate, continuous pulse ox, blood pressure and cardiac monitor Approach: midline Location: L3-4 Injection technique: single-shot Needle Needle type: Whitacre and Introducer  Needle gauge: 25 G Needle length: 9 cm Assessment Sensory level: T10 Additional Notes Negative paresthesia. Negative blood return. Positive free-flowing CSF. Expiration date of kit checked and confirmed. Patient tolerated procedure well, without complications.

## 2019-11-14 NOTE — Anesthesia Postprocedure Evaluation (Signed)
Anesthesia Post Note  Patient: Summer Murphy  Procedure(s) Performed: Revision patella and tibial polyethylene (Right Knee)  Patient location during evaluation: PACU Anesthesia Type: Spinal Level of consciousness: awake and alert and oriented Pain management: pain level controlled Vital Signs Assessment: post-procedure vital signs reviewed and stable Respiratory status: spontaneous breathing Cardiovascular status: blood pressure returned to baseline Anesthetic complications: no   No complications documented.   Last Vitals:  Vitals:   11/14/19 1423 11/14/19 1439  BP:  108/72  Pulse: 99 (!) 107  Resp: 17 16  Temp:  36.8 C  SpO2: 95% 94%    Last Pain:  Vitals:   11/14/19 1517  TempSrc:   PainSc: 9                  Reagan Klemz

## 2019-11-14 NOTE — Anesthesia Procedure Notes (Signed)
Date/Time: 11/14/2019 7:45 AM Performed by: Nelda Marseille, CRNA Pre-anesthesia Checklist: Patient identified, Emergency Drugs available, Suction available, Patient being monitored and Timeout performed Oxygen Delivery Method: Simple face mask

## 2019-11-14 NOTE — Evaluation (Signed)
Physical Therapy Evaluation Patient Details Name: Summer Murphy MRN: [id number] DOB: 08/14/1955 Today's Date: 11/14/2019   History of Present Illness  Patient is admitted for revision of R TKA. PMH includes: OA, DM, GERD, HLD, HTN, RA  Clinical Impression  Patient received in bed, groggy. Agrees to PT assessment. She has sensation in right foot, but is difficult to move due to pain. Requires min assist with R LE in bed and min assist to raise trunk to seated position. Became nauseated upon sitting up on side of bed. Reports 10/10 pain in right knee. She requests to lie back down. Patient will continue to benefit from skilled PT while here to improve functional independence, rom and strength for return home.      Follow Up Recommendations Home health PT    Equipment Recommendations  None recommended by PT    Recommendations for Other Services       Precautions / Restrictions Precautions Precautions: Knee;Fall Precaution Booklet Issued: No Restrictions Weight Bearing Restrictions: Yes RLE Weight Bearing: Weight bearing as tolerated      Mobility  Bed Mobility Overal bed mobility: Needs Assistance Bed Mobility: Supine to Sit;Sit to Supine     Supine to sit: Min assist Sit to supine: Min assist   General bed mobility comments: Required min assist to bring RLE off bed and provide support. Min assist to raise trunk to seated position  Transfers                    Ambulation/Gait                Stairs            Wheelchair Mobility    Modified Rankin (Stroke Patients Only)       Balance                                             Pertinent Vitals/Pain Pain Assessment: 0-10 Pain Score: 10-Worst pain ever Pain Descriptors / Indicators: Aching;Grimacing;Guarding;Discomfort;Sore Pain Intervention(s): Monitored during session;Limited activity within patient's tolerance;Repositioned    Home Living Family/patient expects to  be discharged to:: Private residence Living Arrangements: Spouse/significant other Available Help at Discharge: Family;Available 24 hours/day Type of Home: Apartment Home Access: Elevator     Home Layout: One level Home Equipment: Environmental consultant - 2 wheels      Prior Function Level of Independence: Independent with assistive device(s)         Comments: Patient was having difficulty with mobility due to knee pain     Hand Dominance        Extremity/Trunk Assessment   Upper Extremity Assessment Upper Extremity Assessment: Overall WFL for tasks assessed    Lower Extremity Assessment Lower Extremity Assessment: RLE deficits/detail RLE: Unable to fully assess due to pain RLE Sensation: WNL RLE Coordination: decreased gross motor    Cervical / Trunk Assessment Cervical / Trunk Assessment: Normal  Communication   Communication: No difficulties  Cognition Arousal/Alertness: Lethargic;Suspect due to medications Behavior During Therapy: [facility] for tasks assessed/performed Overall Cognitive Status: Within Functional Limits for tasks assessed                                 General Comments: patient groggy, easily falling asleep when resting.      General Comments  Exercises Total Joint Exercises Ankle Circles/Pumps: AAROM;Right;Left;10 reps   Assessment/Plan    PT Assessment Patient needs continued PT services  PT Problem List Decreased strength;Decreased mobility;Decreased range of motion;Decreased activity tolerance;Pain       PT Treatment Interventions DME instruction;Therapeutic activities;Gait training;Therapeutic exercise;Balance training;Functional mobility training;Neuromuscular re-education;Patient/family education    PT Goals (Current goals can be found in the Care Plan section)  Acute Rehab PT Goals Patient Stated Goal: to return home PT Goal Formulation: With patient/family Time For Goal Achievement: 11/21/19 Potential to Achieve Goals:  Good    Frequency BID   Barriers to discharge        Co-evaluation               AM-PAC PT "6 Clicks" Mobility  Outcome Measure Help needed turning from your back to your side while in a flat bed without using bedrails?: A Little Help needed moving from lying on your back to sitting on the side of a flat bed without using bedrails?: A Little Help needed moving to and from a bed to a chair (including a wheelchair)?: A Lot Help needed standing up from a chair using your arms (e.g., wheelchair or bedside chair)?: A Lot Help needed to walk in hospital room?: Total Help needed climbing 3-5 steps with a railing? : Total 6 Click Score: 12    End of Session Equipment Utilized During Treatment: Oxygen Activity Tolerance: Patient limited by pain;Patient limited by lethargy Patient left: in bed;with bed alarm set;with call bell/phone within reach;with family/visitor present;with SCD's reapplied Nurse Communication: Mobility status PT Visit Diagnosis: Muscle weakness (generalized) (M62.81);Other abnormalities of gait and mobility (R26.89);Pain Pain - Right/Left: Right Pain - part of body: Knee    Time: 1540-1600 PT Time Calculation (min) (ACUTE ONLY): 20 min   Charges:   PT Evaluation $PT Eval Moderate Complexity: 1 Mod          Vinnie Gombert, PT, GCS 11/14/19,4:17 PM

## 2019-11-14 NOTE — Anesthesia Preprocedure Evaluation (Addendum)
Anesthesia Evaluation  Patient identified by MRN, date of birth, ID band Patient awake    Reviewed: Allergy & Precautions, NPO status , Patient's Chart, lab work & pertinent test results  History of Anesthesia Complications Negative for: history of anesthetic complications  Airway Mallampati: II  TM Distance: >3 FB Neck ROM: Full    Dental  (+) Poor Dentition   Pulmonary asthma , neg sleep apnea, COPD,    breath sounds clear to auscultation- rhonchi (-) wheezing      Cardiovascular hypertension, Pt. on medications (-) CAD, (-) Past MI and (-) Cardiac Stents Dysrhythmias: hx of ablation.  Rhythm:Regular Rate:Normal - Systolic murmurs and - Diastolic murmurs    Neuro/Psych  Headaches, PSYCHIATRIC DISORDERS Anxiety Depression  Neuromuscular disease    GI/Hepatic GERD  ,  Endo/Other  diabetes  Renal/GU Renal disease: hx of nephrolithiasis.     Musculoskeletal  (+) Arthritis ,   Abdominal (+) + obese,   Peds  Hematology negative hematology ROS (+)   Anesthesia Other Findings Past Medical History: No date: Acid reflux No date: Anxiety No date: Arrhythmia     Comment:  treated with meds and has no current problems No date: Arthritis     Comment:  most uncomfortable in knees No date: Asthma     Comment:  uses several inhalers No date: Depression No date: Diabetes mellitus without complication (HCC)     Comment:  sugars run low, not high sugars No date: Fever blister No date: Hematuria No date: Hypertension No date: Kidney stone     Comment:  stones, 2nd lithotripsy No date: Left flank pain No date: Migraine No date: Restless leg No date: Yeast vaginitis   Reproductive/Obstetrics                             Anesthesia Physical  Anesthesia Plan  ASA: III  Anesthesia Plan: Spinal   Post-op Pain Management:    Induction: Intravenous  PONV Risk Score and Plan: 2 and Propofol  infusion  Airway Management Planned: Nasal Cannula  Additional Equipment:   Intra-op Plan:   Post-operative Plan:   Informed Consent: I have reviewed the patients History and Physical, chart, labs and discussed the procedure including the risks, benefits and alternatives for the proposed anesthesia with the patient or authorized representative who has indicated his/her understanding and acceptance.     Dental advisory given  Plan Discussed with: CRNA and Anesthesiologist  Anesthesia Plan Comments:         Anesthesia Quick Evaluation

## 2019-11-14 NOTE — Transfer of Care (Signed)
Immediate Anesthesia Transfer of Care Note  Patient: Summer Murphy  Procedure(s) Performed: Revision patella and tibial polyethylene (Right Knee)  Patient Location: PACU  Anesthesia Type:Spinal  Level of Consciousness: awake, alert  and oriented  Airway & Oxygen Therapy: Patient Spontanous Breathing and Patient connected to face mask oxygen  Post-op Assessment: Report given to RN and Post -op Vital signs reviewed and stable  Post vital signs: Reviewed and stable  Last Vitals:  Vitals Value Taken Time  BP 99/62 11/14/19 0855  Temp 36.1 C 11/14/19 0855  Pulse 91 11/14/19 0859  Resp 16 11/14/19 0859  SpO2 95 % 11/14/19 0859  Vitals shown include unvalidated device data.  Last Pain:  Vitals:   11/14/19 0855  TempSrc:   PainSc: 0-No pain         Complications: No complications documented.

## 2019-11-14 NOTE — Op Note (Signed)
11/14/2019  8:46 AM  PATIENT:  Summer Murphy  64 y.o. female  PRE-OPERATIVE DIAGNOSIS:  Loosening of prosthesis of right total knee replacement, subsequent encounter T84.032D  POST-OPERATIVE DIAGNOSIS:  Loosening of prosthesis of right total knee replacement,   PROCEDURE:  Procedure(s): Revision patella and tibial polyethylene (Right)  SURGEON: Laurene Footman, MD  ASSISTANTS: Rachelle Hora, PA-C  ANESTHESIA:   spinal  EBL:  Total I/O In: 100 [IV Piggyback:100] Out: -   BLOOD ADMINISTERED:none  DRAINS: none   LOCAL MEDICATIONS USED:  MARCAINE    and OTHER Exparel  SPECIMEN:  No Specimen  DISPOSITION OF SPECIMEN:  N/A  COUNTS:  YES  TOURNIQUET:   44 minutes at 300 mmHg  IMPLANTS: Medacta GMK PS size 2, 14 mm insert  DICTATION: .Dragon Dictation patient was brought to the operating room and after adequate anesthesia was obtained the right leg was prepped and draped in the usual sterile fashion with a tourniquet applied the upper thigh.  After patient identification and timeout procedures were completed tourniquet was raised and going through the prior incision midline skin incision followed by medial parapatellar arthrotomy was performed.  There is extensive bony overgrowth around the patella and after getting initial exposure this was debrided back to nearly the negative bone.  Examination of the patella inset component showed gross loosening and this was removed without difficulty.  A saw was used to remove the excess cement on the posterior aspect of the residual patella.  The patella was quite thin only few millimeters thick and so was not resurfaced.  After debriding the patella adequately the prior tibial insert was removed after examination showed varus and valgus instability.  A 14 mm trial fit well and gave better stability in mid flexion and extension.  After infiltrating the above local throughout the knee and thorough irrigation of the knee first with a Betadine  solution followed by pulse irrigation the polyethylene component was snapped into place with the set screw tightened with a torque screwdriver.  The arthrotomy was repaired using a heavy Quill.  Additionally with the patella having fragmented as much as it had felt additional support for the extensor mechanism was required so an internal brace system from [employer] was utilized with a fiber tape being placed along the medial and lateral aspect of the patella tendon and around the superior aspect of the patella.  2 anchors were placed distal to the prosthesis medial and lateral with a fiber tape attached with range of motion the patella was stable full extension was obtained in flexion to 120.  At this point the skin was closed with 3 OV lock and skin staples followed by Xeroform 4 x 4's ABD web roll and Ace wrap tourniquet let down at the close of the case.  PLAN OF CARE: Admit for overnight observation  PATIENT DISPOSITION:  PACU - hemodynamically stable.

## 2019-11-15 ENCOUNTER — Encounter: Payer: Self-pay | Admitting: Orthopedic Surgery

## 2019-11-15 LAB — CBC
HCT: 36.8 % (ref 36.0–46.0)
Hemoglobin: 12.4 g/dL (ref 12.0–15.0)
MCH: 30.2 pg (ref 26.0–34.0)
MCHC: 33.7 g/dL (ref 30.0–36.0)
MCV: 89.5 fL (ref 80.0–100.0)
Platelets: 189 10*3/uL (ref 150–400)
RBC: 4.11 MIL/uL (ref 3.87–5.11)
RDW: 12.7 % (ref 11.5–15.5)
WBC: 13.8 10*3/uL — ABNORMAL HIGH (ref 4.0–10.5)
nRBC: 0 % (ref 0.0–0.2)

## 2019-11-15 LAB — BASIC METABOLIC PANEL
Anion gap: 7 (ref 5–15)
BUN: 11 mg/dL (ref 8–23)
CO2: 27 mmol/L (ref 22–32)
Calcium: 8.6 mg/dL — ABNORMAL LOW (ref 8.9–10.3)
Chloride: 94 mmol/L — ABNORMAL LOW (ref 98–111)
Creatinine, Ser: 0.55 mg/dL (ref 0.44–1.00)
GFR calc Af Amer: >60 mL/min (ref >60)
GFR calc non Af Amer: >60 mL/min (ref >60)
Glucose, Bld: 147 mg/dL — ABNORMAL HIGH (ref 70–99)
Potassium: 4.6 mmol/L (ref 3.5–5.1)
Sodium: 128 mmol/L — ABNORMAL LOW (ref 135–145)

## 2019-11-15 LAB — BPAM RBC
Blood Product Expiration Date: 202109162359
Blood Product Expiration Date: 202109192359
Unit Type and Rh: 6200
Unit Type and Rh: 6200

## 2019-11-15 LAB — TYPE AND SCREEN
ABO/RH(D): A POS
Antibody Screen: POSITIVE
Unit division: 0
Unit division: 0

## 2019-11-15 MED ORDER — ONDANSETRON HCL 4 MG/2ML IJ SOLN: 4.0000 mg | Freq: Four times a day (QID) | INTRAMUSCULAR | Status: DC | PRN

## 2019-11-15 MED ORDER — ONDANSETRON HCL 4 MG PO TABS: 4.0000 mg | ORAL_TABLET | Freq: Four times a day (QID) | ORAL | 0 refills | Status: DC | PRN

## 2019-11-15 MED ORDER — ONDANSETRON HCL 4 MG PO TABS: 8.0000 mg | ORAL_TABLET | Freq: Three times a day (TID) | ORAL | Status: DC | PRN

## 2019-11-15 MED ORDER — OXYCODONE HCL 5 MG PO TABS
5.0000 mg | ORAL_TABLET | ORAL | Status: DC | PRN
  Administered 2019-11-16 (×3): 5 mg via ORAL
  Filled 2019-11-15 (×2): qty 1
  Filled 2019-11-15: qty 2
  Filled 2019-11-15: qty 1

## 2019-11-15 MED ORDER — PANTOPRAZOLE SODIUM 40 MG IV SOLR
40.0000 mg | Freq: Once | INTRAVENOUS | Status: AC
  Administered 2019-11-15: 40 mg via INTRAVENOUS
  Filled 2019-11-15: qty 40

## 2019-11-15 MED ORDER — SCOPOLAMINE 1 MG/3DAYS TD PT72
1.0000 | MEDICATED_PATCH | TRANSDERMAL | Status: DC
  Administered 2019-11-15: 1.5 mg via TRANSDERMAL
  Filled 2019-11-15: qty 1

## 2019-11-15 MED ORDER — HYDROCODONE-ACETAMINOPHEN 7.5-325 MG PO TABS: 1.0000 | ORAL_TABLET | ORAL | 0 refills | Status: DC | PRN

## 2019-11-15 MED ORDER — DOCUSATE SODIUM 100 MG PO CAPS: 100.0000 mg | ORAL_CAPSULE | Freq: Two times a day (BID) | ORAL | 0 refills | Status: DC

## 2019-11-15 MED ORDER — ENOXAPARIN SODIUM 40 MG/0.4ML ~~LOC~~ SOLN: 40.0000 mg | SUBCUTANEOUS | 0 refills | Status: DC

## 2019-11-15 MED ORDER — ONDANSETRON HCL 4 MG PO TABS: 8.0000 mg | ORAL_TABLET | Freq: Four times a day (QID) | ORAL | Status: DC | PRN

## 2019-11-15 NOTE — Discharge Instructions (Signed)
° °TOTAL KNEE REPLACEMENT POSTOPERATIVE DIRECTIONS ° °Knee Rehabilitation, Guidelines Following Surgery  °Results after knee surgery are often greatly improved when you follow the exercise, range of motion and muscle strengthening exercises prescribed by your doctor. Safety measures are also important to protect the knee from further injury. Any time any of these exercises cause you to have increased pain or swelling in your knee joint, decrease the amount until you are comfortable again and slowly increase them. If you have problems or questions, call your caregiver or physical therapist for advice.  ° °HOME CARE INSTRUCTIONS  °Remove items at home which could result in a fall. This includes throw rugs or furniture in walking pathways.  °· Continue to use polar care unit on the knee for pain and swelling from surgery. You may notice swelling that will progress down to the foot and ankle.  This is normal after surgery.  Elevate the leg when you are not up walking on it.   °· Continue to use the breathing machine which will help keep your temperature down.  It is common for your temperature to cycle up and down following surgery, especially at night when you are not up moving around and exerting yourself.  The breathing machine keeps your lungs expanded and your temperature down. °· Do not place pillow under knee, focus on keeping the knee STRAIGHT while resting ° °DIET °You may resume your previous home diet once your are discharged from the hospital. ° °DRESSING / WOUND CARE / SHOWERING °You may start showering once staples have been removed. Change the surgical dressing as needed. ° °ACTIVITY °Walk with your walker as instructed. °Use walker as long as suggested by your caregivers. °Avoid periods of inactivity such as sitting longer than an hour when not asleep. This helps prevent blood clots.  °You may resume a sexual relationship in one month or when given the OK by your doctor.  °You may return to work once  you are cleared by your doctor.  °Do not drive a car for 6 weeks or until released by you surgeon.  °Do not drive while taking narcotics. ° °WEIGHT BEARING °Weight bearing as tolerated with assist device (walker, cane, etc) as directed, use it as long as suggested by your surgeon or therapist, typically at least 4-6 weeks. ° °POSTOPERATIVE CONSTIPATION PROTOCOL °Constipation - defined medically as fewer than three stools per week and severe constipation as less than one stool per week. ° °One of the most common issues patients have following surgery is constipation.  Even if you have a regular bowel pattern at home, your normal regimen is likely to be disrupted due to multiple reasons following surgery.  Combination of anesthesia, postoperative narcotics, change in appetite and fluid intake all can affect your bowels.  In order to avoid complications following surgery, here are some recommendations in order to help you during your recovery period. ° °Colace (docusate) - Pick up an over-the-counter form of Colace or another stool softener and take twice a day as long as you are requiring postoperative pain medications.  Take with a full glass of water daily.  If you experience loose stools or diarrhea, hold the colace until you stool forms back up.  If your symptoms do not get better within 1 week or if they get worse, check with your doctor. ° °Dulcolax (bisacodyl) - Pick up over-the-counter and take as directed by the product packaging as needed to assist with the movement of your bowels.  Take with a   full glass of water.  Use this product as needed if not relieved by Colace only.  ° °MiraLax (polyethylene glycol) - Pick up over-the-counter to have on hand.  MiraLax is a solution that will increase the amount of water in your bowels to assist with bowel movements.  Take as directed and can mix with a glass of water, juice, soda, coffee, or tea.  Take if you go more than two days without a movement. °Do not use  MiraLax more than once per day. Call your doctor if you are still constipated or irregular after using this medication for 7 days in a row. ° °If you continue to have problems with postoperative constipation, please contact the office for further assistance and recommendations.  If you experience "the worst abdominal pain ever" or develop nausea or vomiting, please contact the office immediatly for further recommendations for treatment. ° °ITCHING ° If you experience itching with your medications, try taking only a single pain pill, or even half a pain pill at a time.  You can also use Benadryl over the counter for itching or also to help with sleep.  ° °TED HOSE STOCKINGS °Wear the elastic stockings on both legs for six weeks following surgery during the day but you may remove then at night for sleeping. ° °MEDICATIONS °See your medication summary on the “After Visit Summary” that the nursing staff will review with you prior to discharge.  You may have some home medications which will be placed on hold until you complete the course of blood thinner medication.  It is important for you to complete the blood thinner medication as prescribed by your surgeon.  Continue your approved medications as instructed at time of discharge. ° °PRECAUTIONS °If you experience chest pain or shortness of breath - call 911 immediately for transfer to the hospital emergency department.  °If you develop a fever greater that 101 F, purulent drainage from wound, increased redness or drainage from wound, foul odor from the wound/dressing, or calf pain - CONTACT YOUR SURGEON.   °                                                °FOLLOW-UP APPOINTMENTS °Make sure you keep all of your appointments after your operation with your surgeon and caregivers. You should call the office at the above phone number and make an appointment for approximately two weeks after the date of your surgery or on the date instructed by your surgeon outlined in the  "After Visit Summary". ° ° °RANGE OF MOTION AND STRENGTHENING EXERCISES  °Rehabilitation of the knee is important following a knee injury or an operation. After just a few days of immobilization, the muscles of the thigh which control the knee become weakened and shrink (atrophy). Knee exercises are designed to build up the tone and strength of the thigh muscles and to improve knee motion. Often times heat used for twenty to thirty minutes before working out will loosen up your tissues and help with improving the range of motion but do not use heat for the first two weeks following surgery. These exercises can be done on a training (exercise) mat, on the floor, on a table or on a bed. Use what ever works the best and is most comfortable for you Knee exercises include:  °Leg Lifts - While your knee is still   immobilized in a splint or cast, you can do straight leg raises. Lift the leg to 60 degrees, hold for 3 sec, and slowly lower the leg. Repeat 10-20 times 2-3 times daily. Perform this exercise against resistance later as your knee gets better.  °Quad and Hamstring Sets - Tighten up the muscle on the front of the thigh (Quad) and hold for 5-10 sec. Repeat this 10-20 times hourly. Hamstring sets are done by pushing the foot backward against an object and holding for 5-10 sec. Repeat as with quad sets.  °· Leg Slides: Lying on your back, slowly slide your foot toward your buttocks, bending your knee up off the floor (only go as far as is comfortable). Then slowly slide your foot back down until your leg is flat on the floor again. °· Angel Wings: Lying on your back spread your legs to the side as far apart as you can without causing discomfort.  °A rehabilitation program following serious knee injuries can speed recovery and prevent re-injury in the future due to weakened muscles. Contact your doctor or a physical therapist for more information on knee rehabilitation.  ° °IF YOU ARE TRANSFERRED TO A SKILLED REHAB  FACILITY °If the patient is transferred to a skilled rehab facility following release from the hospital, a list of the current medications will be sent to the facility for the patient to continue.  When discharged from the skilled rehab facility, please have the facility set up the patient's Home Health Physical Therapy prior to being released. Also, the skilled facility will be responsible for providing the patient with their medications at time of release from the facility to include their pain medication, the muscle relaxants, and their blood thinner medication. If the patient is still at the rehab facility at time of the two week follow up appointment, the skilled rehab facility will also need to assist the patient in arranging follow up appointment in our office and any transportation needs. ° °MAKE SURE YOU:  °Understand these instructions.  °Get help right away if you are not doing well or get worse.  ° ° ° ° ° °

## 2019-11-15 NOTE — TOC Progression Note (Signed)
Transition of Care [facility]) - Progression Note    Patient Details  Name: VONDELL SOWELL MRN: [id number] Date of Birth: [birth year]  Transition of Care [facility]) CM/SW [place], RN Phone Number: 11/15/2019, 9:41 AM  Clinical Narrative:    Met with the patient and her husband at the bedside, She has a RW at home and stated that she does not need a BSC, She is set up with [facility] from Dr Gabriel Carina and is aware that they will call her prior to service, she has transportation with her husband, she stated that she has no additional needs   Expected Discharge Plan: [place] Barriers to Discharge: Barriers Resolved  Expected Discharge Plan and Services Expected Discharge Plan: [place]   Discharge Planning Services: CM Consult   Living arrangements for the past 2 months: Apartment                 DME Arranged: N/A         HH Arranged: PT HH Agency: [facility] (formerly [employer]) Date [place]: 11/15/19 Time [place]: [phone] Representative spoke with at [place]: [place] ([place]) Interventions    Readmission Risk Interventions No flowsheet data found.

## 2019-11-15 NOTE — Discharge Summary (Signed)
Physician Discharge Summary  Patient ID: Summer Murphy MRN: [id number] DOB/AGE: [birth year] 64 y.o.  Admit date: 11/14/2019 Discharge date: 11/17/2019 Admission Diagnoses:  S/P revision of total knee, right [Z96.651]   Discharge Diagnoses: Patient Active Problem List   Diagnosis Date Noted  . 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 Seasonal allergic rhinitis due to pollen 09/20/2019  . Callus of foot 08/13/2019  . Candidal vaginitis 08/13/2019  . Other symptoms and signs involving the nervous system 08/13/2019  . Encounter for general adult medical examination with abnormal findings 08/13/2019  . Restless leg syndrome 04/30/2019  . Type 2 diabetes mellitus with hyperglycemia ([place]) 04/02/2019  . Muscle cramps 04/02/2019  . Diastolic dysfunction 10/62/6948  . SOB (shortness of breath) 12/14/2018  . Hiatal hernia with GERD 12/14/2018  . Wheezing 12/14/2018  . Impaired fasting glucose 12/14/2018  . COPD with acute exacerbation ([place]) 09/28/2018  . Acute upper respiratory infection 08/25/2018  . Recurrent sinusitis 08/15/2018  . Prediabetes 08/15/2018  . Nonintractable headache 08/15/2018  . Dysuria 08/15/2018  . Intractable migraine without status migrainosus 04/22/2018  . Nausea 04/22/2018  . Polyneuropathy associated with underlying disease ([place]) 04/22/2018  . Midline low back pain without sciatica 04/22/2018  . Routine cervical smear 04/22/2018  . Essential hypertension 07/01/2017  . Acute bronchitis with asthma 07/01/2017  . Allergic rhinitis 07/01/2017  . Moderate asthma without complication 54/62/7035  . Severe recurrent major depression without psychotic features ([place]) 09/10/2014  . COPD (chronic obstructive pulmonary disease) ([place]) 09/10/2014  . Calculi, ureter 04/26/2013  . Corneal graft malfunction 08/17/2012  . Calculus of kidney 04/26/2012  . Renal colic 00/93/8182  . Urge incontinence 04/26/2012  . Diaphragmatic hernia 10/27/2010  . Barrett esophagus  07/07/2010    Past Medical History:  Diagnosis Date  . Acid reflux   . Anxiety   . Arrhythmia    treated with meds and has no current problems  . Arthritis    most uncomfortable in knees  . Asthma    uses several inhalers  . Depression   . Fever blister   . Hematuria   . History of kidney stones   . Hypertension   . Hypoglycemia   . Left flank pain   . Migraine   . Restless leg   . Yeast vaginitis      Transfusion: none   Consultants (if any):   Discharged Condition: Improved  Hospital Course: [name] is an 64 y.o. female who was admitted 11/14/2019 with a diagnosis of loosening of right total knee prosthesis, patellar component with instability/polywear and went to the operating room on 11/14/2019 and underwent the above named procedures.    Surgeries: Procedure(s): Revision patella and tibial polyethylene on 11/14/2019 Patient tolerated the surgery well. Taken to PACU where she was stabilized and then transferred to the orthopedic floor.  Started on Lovenox 30 mg q 12 hrs. Foot pumps applied bilaterally at 80 mm. Heels elevated on bed with rolled towels. No evidence of DVT. Negative [place]. Physical therapy started on day #1 for gait training and transfer. OT started day #1 for ADL and assisted devices.  Patient's foley was d/c on day #1. Patient's IV was d/c on day #2.  On post op day 3 patient was stable and ready for discharge to home with home health PT.  Implants: Medacta GMK PS size 2, 14 mm insert  She was given perioperative antibiotics:  Anti-infectives (From admission, onward)   Start     Dose/Rate Route  Frequency Ordered Stop   11/14/19 1425  valACYclovir (VALTREX) tablet 500 mg        500 mg Oral Daily PRN 11/14/19 1426     11/14/19 1330  ceFAZolin (ANCEF) IVPB 2g/100 mL premix        2 g 200 mL/hr over 30 Minutes Intravenous Every 6 hours 11/14/19 1226 11/15/19 0129   11/14/19 0626  ceFAZolin (ANCEF) 2-4 GM/100ML-% IVPB       Note to  Pharmacy: [place]   : cabinet override      11/14/19 0626 11/14/19 0747   11/14/19 0600  ceFAZolin (ANCEF) IVPB 2g/100 mL premix        2 g 200 mL/hr over 30 Minutes Intravenous On call to O.R. 11/14/19 [id number] 11/14/19 0745    .  She was given sequential compression devices, early ambulation, and Lovenox, teds for DVT prophylaxis.  She benefited maximally from the hospital stay and there were no complications.    Recent vital signs:  Vitals:   11/17/19 0003 11/17/19 0350  BP: 120/61 (!) 103/47  Pulse: 92 88  Resp: 16 16  Temp: 98.5 F (36.9 C) 98.7 F (37.1 C)  SpO2: 98% 100%    Recent laboratory studies:  Lab Results  Component Value Date   HGB 11.6 (L) 11/16/2019   HGB 12.4 11/15/2019   HGB 13.5 11/14/2019   Lab Results  Component Value Date   WBC 10.8 (H) 11/16/2019   PLT 199 11/16/2019   Lab Results  Component Value Date   INR 0.9 10/17/2013   Lab Results  Component Value Date   NA 136 11/16/2019   K 4.6 11/16/2019   CL 99 11/16/2019   CO2 29 11/16/2019   BUN 15 11/16/2019   CREATININE 0.54 11/16/2019   GLUCOSE 126 (H) 11/16/2019    Discharge Medications:   Allergies as of 11/17/2019      Reactions   Librium [chlordiazepoxide] Shortness Of Breath   Metoclopramide Hives, Other (See Comments)   hallucinations   Vanilla Shortness Of Breath   Aspirin Hives   Nsaids Rash   Rash/flares asthma issues.   Azithromycin Other (See Comments)   Unsure of what the reaction was.   Buprenorphine Hcl Other (See Comments)   Unsure of what the reaction was.   Chlordiazepoxide Hcl Other (See Comments)   unsure   Morphine Other (See Comments)   Unsure of reaction   Sulfa Antibiotics    Other reaction(s): Unknown   Tolmetin    Other Reaction: Allergy   Amlodipine Besylate Itching, Rash   arms, stomach and forehead   Iron Nausea And Vomiting   Other reaction(s): Unknown      Medication List    TAKE these medications   BIOTIN PO Take 1 tablet by  mouth daily.   budesonide-formoterol 160-4.5 MCG/ACT inhaler Commonly known as: SYMBICORT INHALE 2 PUFFS BY MOUTH TWICE DAILY AS NEEDED FOR RESPIRATORY ISSUES What changed:   how much to take  how to take this  when to take this   busPIRone 10 MG tablet Commonly known as: BUSPAR Take 10 mg by mouth 2 (two) times daily.   cetirizine 10 MG tablet Commonly known as: ZYRTEC Take 1 tablet (10 mg total) by mouth daily.   diclofenac sodium 1 % Gel Commonly known as: VOLTAREN Apply 2 g topically in the morning, at noon, and at bedtime.   diltiazem 180 MG 24 hr capsule Commonly known as: DILACOR XR Take 1 capsule (180 mg total) by  mouth daily.   diphenhydrAMINE 25 MG tablet Commonly known as: BENADRYL Take 50 mg by mouth 2 (two) times daily as needed.   diphenoxylate-atropine 2.5-0.025 MG tablet Commonly known as: Lomotil Take 1 tablet by mouth 3 (three) times daily as needed for diarrhea or loose stools.   docusate sodium 100 MG capsule Commonly known as: COLACE Take 1 capsule (100 mg total) by mouth 2 (two) times daily.   DULoxetine 60 MG capsule Commonly known as: CYMBALTA Take 60 mg by mouth daily.   enoxaparin 40 MG/0.4ML injection Commonly known as: Lovenox Inject 0.4 mLs (40 mg total) into the skin daily for 14 days.   gabapentin 600 MG tablet Commonly known as: NEURONTIN Take 1 tablet (600 mg total) by mouth 2 (two) times daily.   ipratropium-albuterol 0.5-2.5 (3) MG/3ML Soln Commonly known as: DUONEB Take 3 mLs by nebulization every 4 (four) hours as needed (for shortness of breath/wheezing).   Combivent Respimat 20-100 MCG/ACT Aers respimat Generic drug: Ipratropium-Albuterol INHALE 1 PUFF INTO THE LUNGS EVERY 6 HOURS   ketoconazole 2 % cream Commonly known as: NIZORAL Apply 1 application topically daily. What changed:   when to take this  reasons to take this   LORazepam 1 MG tablet Commonly known as: ATIVAN Take 1 mg by mouth at bedtime as  needed for sleep.   metoprolol tartrate 25 MG tablet Commonly known as: LOPRESSOR Take 1 tablet (25 mg total) by mouth 2 (two) times daily.   montelukast 10 MG tablet Commonly known as: SINGULAIR TAKE 1 TABLET(10 MG) BY MOUTH AT BEDTIME What changed: See the new instructions.   omeprazole 40 MG capsule Commonly known as: PRILOSEC Take 40 mg by mouth in the morning and at bedtime.   ondansetron 4 MG tablet Commonly known as: Zofran Take 1 tablet (4 mg total) by mouth every 8 (eight) hours as needed for nausea or vomiting. What changed: Another medication with the same name was added. Make sure you understand how and when to take each.   ondansetron 4 MG tablet Commonly known as: ZOFRAN Take 1 tablet (4 mg total) by mouth every 6 (six) hours as needed for nausea. What changed: You were already taking a medication with the same name, and this prescription was added. Make sure you understand how and when to take each.   oxybutynin 5 MG tablet Commonly known as: DITROPAN Take 5 mg by mouth in the morning and at bedtime.   oxyCODONE 5 MG immediate release tablet Commonly known as: Oxy IR/ROXICODONE Take 1-2 tablets (5-10 mg total) by mouth every 4 (four) hours as needed for moderate pain or severe pain.   pantoprazole 40 MG tablet Commonly known as: PROTONIX Take 40 mg by mouth 2 (two) times daily before a meal.   Rexulti 4 MG Tabs Generic drug: Brexpiprazole Take 4 mg by mouth daily.   rOPINIRole 0.5 MG tablet Commonly known as: REQUIP Take two tablets po BID for restless legs. What changed:   how much to take  how to take this  when to take this  additional instructions   Spiriva Respimat 1.25 MCG/ACT Aers Generic drug: Tiotropium Bromide Monohydrate Inhale 2 puffs into the lungs daily.   SUMAtriptan 100 MG tablet Commonly known as: IMITREX Take 1 tablet (100 mg total) by mouth every 2 (two) hours as needed (for migraine headaches.). May repeat in 1 hours if  headache persists or recurs.   traMADol 50 MG tablet Commonly known as: ULTRAM Take 50 mg by mouth  every 6 (six) hours as needed for moderate pain.   traZODone 150 MG tablet Commonly known as: DESYREL Take 150-300 mg by mouth at bedtime.   valACYclovir 500 MG tablet Commonly known as: VALTREX Take 500 mg by mouth daily as needed (for fever blisters).            Durable Medical Equipment  (From admission, onward)         Start     Ordered   11/14/19 1227  DME Walker rolling  Once       Question Answer Comment  Walker: With 5 Inch Wheels   Patient needs a walker to treat with the following condition S/P revision of total knee, right      11/14/19 1226   11/14/19 1227  DME 3 n 1  Once        11/14/19 1226   11/14/19 1227  DME Bedside commode  Once       Question:  Patient needs a bedside commode to treat with the following condition  Answer:  S/P revision of total knee, right   11/14/19 1226          Diagnostic Studies: DG Knee 1-2 Views Right  Result Date: 11/14/2019 CLINICAL DATA:  S/p right knee revision patella and tibial polyethylene EXAM: RIGHT KNEE - 1-2 VIEW COMPARISON:  None. FINDINGS: RIGHT knee arthroplasty hardware appears intact and appropriately positioned. Osseous alignment is anatomic. Expected postsurgical changes of the overlying soft tissues. IMPRESSION: Normal postoperative appearance of the RIGHT knee. No evidence of surgical complicating feature. Electronically Signed   By: Franki Cabot M.D.   On: 11/14/2019 09:50    Disposition:      Follow-up Information    Duanne Guess, PA-C Follow up in 2 week(s).   Specialties: Orthopedic Surgery, Emergency Medicine Contact information: [place] [address] [phone]                Signed: Feliberto Gottron 11/17/2019, 8:15 AM

## 2019-11-15 NOTE — Progress Notes (Signed)
Physical Therapy Treatment Patient Details Name: Summer Murphy MRN: [id number] DOB: 1955/05/31 Today's Date: 11/15/2019    History of Present Illness Patient is admitted for revision of R TKA. PMH includes: OA, DM, GERD, HLD, HTN, RA    PT Comments    Pt lying in bed drowsy and falling asleep upon arrival to room. Pt noted to be on 2L O2 via nasal cannula. Pt falling asleep easily throughout session requiring continued verbal and tactile cues for continued participation. SpO2 was 97%, HR 107 prior to activity. Removed O2 and monitored O2 sats with activity. Pt performed therex in bed with assistance due to pain and lethargy and supine to sit with min A for RLE and trunk management. Stand pivot transfer from bed to recliner chair using RW and CGA for steadying. O2 sats at 96 during transfer however after pt sat down and began to doze off, desat to 89%. Replaced 2L O2 and pt returned to 94%. Pt making slow progress due to pain and lethargy. PT will continue to follow this acute stay and progress pt as tolerated.    Follow Up Recommendations  Home health PT     Equipment Recommendations  None recommended by PT    Recommendations for Other Services       Precautions / Restrictions Precautions Precautions: Knee;Fall Precaution Booklet Issued: Yes (comment) Restrictions Weight Bearing Restrictions: Yes RLE Weight Bearing: Weight bearing as tolerated    Mobility  Bed Mobility Overal bed mobility: Needs Assistance Bed Mobility: Supine to Sit     Supine to sit: Min assist     General bed mobility comments: Min A for RLE management to edge of bed. Pt reached out to clinician for assistance for trunk elevation and required min A to sit upright.  Transfers Overall transfer level: Needs assistance Equipment used: Rolling walker (2 wheeled) Transfers: Stand Pivot Transfers   Stand pivot transfers: Min guard       General transfer comment: CGA for pt to complete stand pivot  transfers from bed to recliner chair. CGA for pt to take short steps to turn to sit in recliner chair. Verbal cues for hand placement.  Ambulation/Gait             General Gait Details: deferred due to HR in 130s with transfer and decreased arousal   Stairs             Wheelchair Mobility    Modified Rankin (Stroke Patients Only)       Balance                                            Cognition Arousal/Alertness: Lethargic;Suspect due to medications Behavior During Therapy: [facility] for tasks assessed/performed Overall Cognitive Status: Within Functional Limits for tasks assessed                                 General Comments: patient groggy, easily falling asleep      Exercises Total Joint Exercises Goniometric ROM: RLE PROM: 12-55 degrees Other Exercises Other Exercises: AAROM: RLE AP and heel slides x 10, QS x 10; verbal cues for correct technique and for continued participation due to being sleepy and falling asleep    General Comments        Pertinent Vitals/Pain Pain Assessment: 0-10 Pain Score: 10-Worst  pain ever Pain Location: R knee at rest and with movement Pain Descriptors / Indicators: Aching;Grimacing;Guarding;Discomfort;Sore Pain Intervention(s): Monitored during session;Limited activity within patient's tolerance    Home Living                      Prior Function            PT Goals (current goals can now be found in the care plan section) Acute Rehab PT Goals Patient Stated Goal: to return home PT Goal Formulation: With patient/family Time For Goal Achievement: 11/21/19 Potential to Achieve Goals: Good Progress towards PT goals: Progressing toward goals    Frequency    BID      PT Plan Current plan remains appropriate    Co-evaluation              AM-PAC PT "6 Clicks" Mobility   Outcome Measure  Help needed turning from your back to your side while in a flat bed without  using bedrails?: A Little Help needed moving from lying on your back to sitting on the side of a flat bed without using bedrails?: A Little Help needed moving to and from a bed to a chair (including a wheelchair)?: A Little Help needed standing up from a chair using your arms (e.g., wheelchair or bedside chair)?: A Little Help needed to walk in hospital room?: A Little Help needed climbing 3-5 steps with a railing? : A Lot 6 Click Score: 17    End of Session Equipment Utilized During Treatment: Oxygen Activity Tolerance: Patient limited by lethargy;Patient limited by pain Patient left: in chair;with call bell/phone within reach;with chair alarm set;with family/visitor present Nurse Communication: Mobility status PT Visit Diagnosis: Muscle weakness (generalized) (M62.81);Other abnormalities of gait and mobility (R26.89);Pain Pain - Right/Left: Right Pain - part of body: Knee     Time: 4287-6811 PT Time Calculation (min) (ACUTE ONLY): 38 min  Charges:                        Vale Haven, SPT   Vale Haven 11/15/2019, 12:56 PM

## 2019-11-15 NOTE — Progress Notes (Signed)
Physical Therapy Treatment Patient Details Name: Summer Murphy MRN: [id number] DOB: 12/20/1955 Today's Date: 11/15/2019    History of Present Illness Patient is admitted for revision of R TKA. PMH includes: OA, DM, GERD, HLD, HTN, RA    PT Comments    Pt had returned to bed with nursing.  Exercises as below. Pt falling asleep during session even during active exercises despite verbal cues.  Further OOB mobility deferred. Pt is struggling with mobility.  Anticipate improved mobility tomorrow but if not, may need to consider SNF discharge.   Follow Up Recommendations  Home health PT     Equipment Recommendations  None recommended by PT    Recommendations for Other Services       Precautions / Restrictions Precautions Precautions: Knee;Fall Precaution Booklet Issued: Yes (comment) Restrictions Weight Bearing Restrictions: Yes RLE Weight Bearing: Weight bearing as tolerated    Mobility  Bed Mobility Overal bed mobility: Needs Assistance Bed Mobility: Supine to Sit     Supine to sit: Min assist     General bed mobility comments: deferred due to lethargy  Transfers Overall transfer level: Needs assistance Equipment used: Rolling walker (2 wheeled) Transfers: Stand Pivot Transfers   Stand pivot transfers: Min guard       General transfer comment: CGA for pt to complete stand pivot transfers from bed to recliner chair. CGA for pt to take short steps to turn to sit in recliner chair. Verbal cues for hand placement.  Ambulation/Gait             General Gait Details: deferred due to HR in 130s with transfer and decreased arousal   Stairs             Wheelchair Mobility    Modified Rankin (Stroke Patients Only)       Balance                                            Cognition Arousal/Alertness: Lethargic;Suspect due to medications Behavior During Therapy: [facility] for tasks assessed/performed Overall Cognitive Status: Within  Functional Limits for tasks assessed                                 General Comments: patient groggy, easily falling asleep      Exercises Total Joint Exercises Goniometric ROM: RLE PROM: 12-55 degrees Other Exercises Other Exercises: AAROM: RLE AP, SLR and heel slides x 10, QS x 10; verbal cues for correct technique and for continued participation due to being sleepy and falling asleep    General Comments        Pertinent Vitals/Pain Pain Assessment: Faces Pain Score: 10-Worst pain ever Faces Pain Scale: Hurts little more Pain Location: R knee at rest and with movement Pain Descriptors / Indicators: Aching;Grimacing;Guarding;Discomfort;Sore Pain Intervention(s): Limited activity within patient's tolerance;Monitored during session;Repositioned    Home Living                      Prior Function            PT Goals (current goals can now be found in the care plan section) Acute Rehab PT Goals Patient Stated Goal: to return home PT Goal Formulation: With patient/family Time For Goal Achievement: 11/21/19 Potential to Achieve Goals: Good Progress towards PT goals: Progressing toward goals  Frequency    BID      PT Plan Current plan remains appropriate    Co-evaluation              AM-PAC PT "6 Clicks" Mobility   Outcome Measure  Help needed turning from your back to your side while in a flat bed without using bedrails?: A Little Help needed moving from lying on your back to sitting on the side of a flat bed without using bedrails?: A Little Help needed moving to and from a bed to a chair (including a wheelchair)?: A Little Help needed standing up from a chair using your arms (e.g., wheelchair or bedside chair)?: A Little Help needed to walk in hospital room?: A Little Help needed climbing 3-5 steps with a railing? : A Lot 6 Click Score: 17    End of Session Equipment Utilized During Treatment: Oxygen Activity Tolerance:  Patient limited by lethargy Patient left: in bed;with call bell/phone within reach;with bed alarm set Nurse Communication: Mobility status PT Visit Diagnosis: Muscle weakness (generalized) (M62.81);Other abnormalities of gait and mobility (R26.89);Pain Pain - Right/Left: Right Pain - part of body: Knee     Time: 7680-8811 PT Time Calculation (min) (ACUTE ONLY): 38 min  Charges:  $Therapeutic Exercise: 8-22 mins $Therapeutic Activity: 23-37 mins                    Chesley Noon, PTA 11/15/19, 1:40 PM

## 2019-11-15 NOTE — Progress Notes (Signed)
   Subjective: 1 Day Post-Op Procedure(s) (LRB): Revision patella and tibial polyethylene (Right) Patient reports pain as 10 on 0-10 scale.   Patient is doing well but complaining of some nausea.  No vomiting. Denies any CP, SOB, ABD pain. We will continue therapy today.  Plan is to go Home after hospital stay.  Objective: Vital signs in last 24 hours: Temp:  [96.9 F (36.1 C)-99.3 F (37.4 C)] 98.3 F (36.8 C) (08/25 0726) Pulse Rate:  [76-108] 108 (08/25 0726) Resp:  [12-19] 18 (08/25 0726) BP: (94-155)/(46-80) 155/80 (08/25 0726) SpO2:  [91 %-99 %] 96 % (08/25 0726) Weight:  [78.9 kg] 78.9 kg (08/24 1843)  Intake/Output from previous day: 08/24 0701 - 08/25 0700 In: 1500.6 [I.V.:1400.6; IV Piggyback:100] Out: 2420 [Urine:2400; Blood:20] Intake/Output this shift: No intake/output data recorded.  Recent Labs    11/14/19 1513 11/15/19 0604  HGB 13.5 12.4   Recent Labs    11/14/19 1513 11/15/19 0604  WBC 13.2* 13.8*  RBC 4.50 4.11  HCT 40.6 36.8  PLT 195 189   Recent Labs    11/14/19 1513 11/15/19 0604  NA  --  128*  K  --  4.6  CL  --  94*  CO2  --  27  BUN  --  11  CREATININE 0.67 0.55  GLUCOSE  --  147*  CALCIUM  --  8.6*   No results for input(s): LABPT, INR in the last 72 hours.  EXAM General - Patient is Alert, Appropriate and Oriented Extremity - Neurovascular intact Sensation intact distally Intact pulses distally Dorsiflexion/Plantar flexion intact No cellulitis present Compartment soft Dressing - dressing C/D/I and no drainage Motor Function - intact, moving foot and toes well on exam.   Past Medical History:  Diagnosis Date  . Acid reflux   . Anxiety   . Arrhythmia    treated with meds and has no current problems  . Arthritis    most uncomfortable in knees  . Asthma    uses several inhalers  . Depression   . Fever blister   . Hematuria   . History of kidney stones   . Hypertension   . Hypoglycemia   . Left flank pain   .  Migraine   . Restless leg   . Yeast vaginitis     Assessment/Plan:   1 Day Post-Op Procedure(s) (LRB): Revision patella and tibial polyethylene (Right) Active Problems:   S/P revision of total knee, right  Estimated body mass index is 30.81 kg/m as calculated from the following:   Height as of this encounter: 5\' 3"  (1.6 m).   Weight as of this encounter: 78.9 kg. Advance diet Up with therapy  Work on bowel movement Vital signs are stable Hemoglobin and hematocrit stable. Hyponatremia -sodium 128 will DC IV fluids recheck labs in the morning Scopolamine patch for nausea Care manager to assist with discharge to home with home health  DVT Prophylaxis - Lovenox, Foot Pumps and TED hose Weight-Bearing as tolerated to right leg   T. Rachelle Hora, PA-C [place] 11/15/2019, 8:00 AM

## 2019-11-16 LAB — CBC
HCT: 34.4 % — ABNORMAL LOW (ref 36.0–46.0)
Hemoglobin: 11.6 g/dL — ABNORMAL LOW (ref 12.0–15.0)
MCH: 30.5 pg (ref 26.0–34.0)
MCHC: 33.7 g/dL (ref 30.0–36.0)
MCV: 90.5 fL (ref 80.0–100.0)
Platelets: 199 10*3/uL (ref 150–400)
RBC: 3.8 MIL/uL — ABNORMAL LOW (ref 3.87–5.11)
RDW: 13 % (ref 11.5–15.5)
WBC: 10.8 10*3/uL — ABNORMAL HIGH (ref 4.0–10.5)
nRBC: 0 % (ref 0.0–0.2)

## 2019-11-16 LAB — BASIC METABOLIC PANEL
Anion gap: 8 (ref 5–15)
BUN: 15 mg/dL (ref 8–23)
CO2: 29 mmol/L (ref 22–32)
Calcium: 8.3 mg/dL — ABNORMAL LOW (ref 8.9–10.3)
Chloride: 99 mmol/L (ref 98–111)
Creatinine, Ser: 0.54 mg/dL (ref 0.44–1.00)
GFR calc Af Amer: >60 mL/min (ref >60)
GFR calc non Af Amer: >60 mL/min (ref >60)
Glucose, Bld: 126 mg/dL — ABNORMAL HIGH (ref 70–99)
Potassium: 4.6 mmol/L (ref 3.5–5.1)
Sodium: 136 mmol/L (ref 135–145)

## 2019-11-16 MED ORDER — OXYCODONE HCL 5 MG PO TABS: 5.0000 mg | ORAL_TABLET | ORAL | 0 refills | Status: DC | PRN

## 2019-11-16 MED ORDER — LACTATED RINGERS IV BOLUS
500.0000 mL | Freq: Once | INTRAVENOUS | Status: AC
  Administered 2019-11-16: 500 mL via INTRAVENOUS

## 2019-11-16 MED ORDER — SODIUM CHLORIDE 0.9 % IV BOLUS
500.0000 mL | Freq: Once | INTRAVENOUS | Status: AC
  Administered 2019-11-16: 500 mL via INTRAVENOUS

## 2019-11-16 MED ORDER — METOPROLOL TARTRATE 25 MG PO TABS
25.0000 mg | ORAL_TABLET | Freq: Two times a day (BID) | ORAL | Status: DC
  Administered 2019-11-16 – 2019-11-17 (×2): 25 mg via ORAL
  Filled 2019-11-16 (×3): qty 1

## 2019-11-16 NOTE — Progress Notes (Signed)
Physical Therapy Treatment Patient Details Name: Summer Murphy MRN: [id number] DOB: 05/22/1955 Today's Date: 11/16/2019    History of Present Illness Patient is admitted for revision of R TKA. PMH includes: OA, DM, GERD, HLD, HTN, RA    PT Comments    Pt was sitting in recliner upon arriving. She is extremely lethargic throughout session. Several times pt even falls asleep while performing exercises. Supportive spouse (is HOH)  in room. Pt has had limited progress made towards goals 2/2 to HR/ lethargy. Resting vitals; BP 154/62 HR 106 O2 99% on 2 L. Discontinued O2 during session and O2 maintained > 94%. RN aware. Pt stood from recliner with min assist. Ambulated ~ 8 ft with RW without LOB however HR elevated to 148 bpm with altered alertness/ falls asleep? She was returned to recliner and RN notified. Therapist feels pt will be unsafe to DC home with this level of alertness. Spouse/pt in agreement. Recommend DC to SNF 2/2 to cognition and lack of progress towards acute goals. She was in recliner with chair alarm in place and call bell in reach.     Follow Up Recommendations  SNF     Equipment Recommendations  None recommended by PT    Recommendations for Other Services       Precautions / Restrictions Precautions Precautions: Knee;Fall Precaution Booklet Issued: Yes (comment) Restrictions Weight Bearing Restrictions: Yes RLE Weight Bearing: Weight bearing as tolerated    Mobility  Bed Mobility               General bed mobility comments: pt was in recliner pre/post session  Transfers Overall transfer level: Needs assistance Equipment used: Rolling walker (2 wheeled) Transfers: Sit to/from Stand Sit to Stand: Min assist         General transfer comment: Min assist to STS from recliner. Vcs for proper handplacement and improved wt shift  Ambulation/Gait Ambulation/Gait assistance: Min assist Gait Distance (Feet): 8 Feet Assistive device: Rolling walker (2  wheeled) Gait Pattern/deviations: Step-to pattern;Trunk flexed;Decreased stride length;Antalgic Gait velocity: decreased   General Gait Details: Pt was only able to ambulate ~ 8 ft prior to pt's HR elevating to 148 bpm and becoming less alert/talkative   Stairs             Wheelchair Mobility    Modified Rankin (Stroke Patients Only)       Balance                                            Cognition Arousal/Alertness: Lethargic;Suspect due to medications Behavior During Therapy: Flat affect Overall Cognitive Status: Difficult to assess                                 General Comments: Lethargic,patient groggy, easily falling asleep, unable to keep eyes open throughout session      Exercises Other Exercises Other Exercises: unable to stay awake when performing exercise handout    General Comments        Pertinent Vitals/Pain Pain Assessment: 0-10 Pain Score: 6  Faces Pain Scale: Hurts little more Pain Location: R knee with movements Pain Descriptors / Indicators: Aching;Grimacing;Guarding;Discomfort;Sore Pain Intervention(s): Limited activity within patient's tolerance;Monitored during session;Premedicated before session;Repositioned    Home Living  Prior Function            PT Goals (current goals can now be found in the care plan section) Acute Rehab PT Goals Patient Stated Goal: to return home Progress towards PT goals: Not progressing toward goals - comment (pt's alertness and HR limiting progression)    Frequency    BID      PT Plan Discharge plan needs to be updated    Co-evaluation              AM-PAC PT "6 Clicks" Mobility   Outcome Measure  Help needed turning from your back to your side while in a flat bed without using bedrails?: A Little Help needed moving from lying on your back to sitting on the side of a flat bed without using bedrails?: A Little Help needed  moving to and from a bed to a chair (including a wheelchair)?: A Little Help needed standing up from a chair using your arms (e.g., wheelchair or bedside chair)?: A Little Help needed to walk in hospital room?: A Lot Help needed climbing 3-5 steps with a railing? : A Lot 6 Click Score: 16    End of Session Equipment Utilized During Treatment: Oxygen;Gait belt Activity Tolerance: Patient limited by lethargy Patient left: in chair;with call bell/phone within reach;with chair alarm set;with family/visitor present Nurse Communication: Mobility status PT Visit Diagnosis: Muscle weakness (generalized) (M62.81);Other abnormalities of gait and mobility (R26.89);Pain Pain - Right/Left: Right Pain - part of body: Knee     Time: 2330-0762 PT Time Calculation (min) (ACUTE ONLY): 28 min  Charges:  $Therapeutic Activity: 23-37 mins                     Julaine Fusi PTA 11/16/19, 1:51 PM

## 2019-11-16 NOTE — Progress Notes (Signed)
Physical Therapy Treatment Patient Details Name: Summer Murphy MRN: [id number] DOB: [birth year] Today's Date: 11/16/2019    History of Present Illness Patient is admitted for revision of R TKA. PMH includes: OA, DM, GERD, HLD, HTN, RA    PT Comments    Pt was long sitting in bed upon arriving. She is much more alert this afternoon and is agreeable to session. She required supervision + increased time to exit R side of bed. Stood to [employer] CGA and ambulated 90ft without LOB or unsteadiness. Distance limited by pain. HR more stable this afternoon. In sitting HR 101 bpm during ambulation <120 bpm. She tolerated gait well and is progressing towards goals. AROM R knee 6-78 degrees. Pain limiting. Pt has supportive significant other and wants to DC home when cleared by MD. She will benefit from skilled HHPT to address deficits with ROM, strength, endurance, and overall safe functional mobility. Acute OPT will continue to progress as able per POC. At conclusion of session, pt has polar care applied, bone foam in place, and call bell in reach.     Follow Up Recommendations  Home health PT;Supervision for mobility/OOB     Equipment Recommendations  None recommended by PT    Recommendations for Other Services       Precautions / Restrictions Precautions Precautions: Knee;Fall Precaution Booklet Issued: Yes (comment) Restrictions Weight Bearing Restrictions: Yes RLE Weight Bearing: Weight bearing as tolerated    Mobility  Bed Mobility Overal bed mobility: Needs Assistance Bed Mobility: Supine to Sit     Supine to sit: Supervision Sit to supine: Supervision   General bed mobility comments: Pt was able to exit bed without physical assistance. IOncreased time to perform 2/2 to pain  Transfers Overall transfer level: Needs assistance Equipment used: Rolling walker (2 wheeled) Transfers: Sit to/from Stand Sit to Stand: Min guard         General transfer comment: CGA to STS from  lowest bed height  Ambulation/Gait Ambulation/Gait assistance: Min guard Gait Distance (Feet): 75 Feet Assistive device: Rolling walker (2 wheeled) Gait Pattern/deviations: Antalgic;Trunk flexed;Decreased step length - right Gait velocity: decreased   General Gait Details: pt was able to ambulate increased distance this afternoon however endorses increased pain. HR <120 BPM   Stairs             Wheelchair Mobility    Modified Rankin (Stroke Patients Only)       Balance                                            Cognition Arousal/Alertness: Awake/alert Behavior During Therapy: Flat affect Overall Cognitive Status: Within Functional Limits for tasks assessed                                 General Comments: Pt was much more alert this afternoon versus morning session. per RN, pt cognition has improved but she has not been given pain meds. Pt does endorse increased pain      Exercises Total Joint Exercises Goniometric ROM: 6- 78 Other Exercises Other Exercises: reviewed handout and pt states understanding, Pt issued polar care and LEs resting in bone fam post session.    General Comments        Pertinent Vitals/Pain Pain Assessment: 0-10 Pain Score: 9  (wt bearing) Pain Location:  R knee with movements Pain Descriptors / Indicators: Aching;Grimacing;Guarding;Discomfort;Sore Pain Intervention(s): Limited activity within patient's tolerance;Monitored during session;Repositioned;Ice applied    Home Living                      Prior Function            PT Goals (current goals can now be found in the care plan section) Acute Rehab PT Goals Patient Stated Goal: to return home Progress towards PT goals: Progressing toward goals    Frequency    BID      PT Plan Discharge plan needs to be updated    Co-evaluation              AM-PAC PT "6 Clicks" Mobility   Outcome Measure  Help needed turning from  your back to your side while in a flat bed without using bedrails?: A Little Help needed moving from lying on your back to sitting on the side of a flat bed without using bedrails?: A Little Help needed moving to and from a bed to a chair (including a wheelchair)?: A Little Help needed standing up from a chair using your arms (e.g., wheelchair or bedside chair)?: A Little Help needed to walk in hospital room?: A Little Help needed climbing 3-5 steps with a railing? : A Little 6 Click Score: 18    End of Session Equipment Utilized During Treatment: Gait belt Activity Tolerance: Patient limited by pain;Patient tolerated treatment well Patient left: in bed;with call bell/phone within reach;with bed alarm set;with family/visitor present Nurse Communication: Mobility status PT Visit Diagnosis: Muscle weakness (generalized) (M62.81);Other abnormalities of gait and mobility (R26.89);Pain Pain - Right/Left: Right Pain - part of body: Knee     Time: 6578-4696 PT Time Calculation (min) (ACUTE ONLY): 27 min  Charges:  $Gait Training: 8-22 mins $Therapeutic Exercise: 8-22 mins                     Julaine Fusi PTA 11/16/19, 3:57 PM

## 2019-11-16 NOTE — Progress Notes (Signed)
Patient had an episode of coffee ground emesis mostly in the trash can was able to catch it in the basin to confirm coffee ground color and texture.. Dr. Rudene Christians notified, IV Protonix  and IV zofran. Given. Will continue to monitor.

## 2019-11-16 NOTE — Progress Notes (Signed)
   11/16/19 0057  Assess: MEWS Score  Temp 98.3 F (36.8 C)  BP 134/73  Pulse Rate (!) 117  Resp 16  SpO2 100 %  O2 Device Nasal Cannula  O2 Flow Rate (L/min) 2 L/min  Assess: MEWS Score  MEWS Temp 0  MEWS Systolic 0  MEWS Pulse 2  MEWS RR 0  MEWS LOC 0  MEWS Score 2  MEWS Score Color Yellow  Assess: if the MEWS score is Yellow or Red  Were vital signs taken at a resting state? Yes  Focused Assessment Change from prior assessment (see assessment flowsheet)  Early Detection of Sepsis Score *See Row Information* Low  MEWS guidelines implemented *See Row Information* Yes  Treat  MEWS Interventions Administered scheduled meds/treatments  Pain Scale 0-10  Pain Score 0  Pain Type Surgical pain  Pain Intervention(s) Refused  Take Vital Signs  Increase Vital Sign Frequency  Yellow: Q 2hr X 2 then Q 4hr X 2, if remains yellow, continue Q 4hrs  Escalate  MEWS: Escalate Yellow: discuss with charge nurse/RN and consider discussing with provider and RRT  Notify: Charge Nurse/RN  Name of Charge Nurse/RN Notified Neoma Laming Cladding   Date Charge Nurse/RN Notified 11/16/19  Time Charge Nurse/RN Notified 1638  Notify: Provider  Provider Name/Title Rudene Christians  Date Provider Notified 11/16/19  Time Provider Notified [phone]  Notification Type Call  Notification Reason Change in status (elevated HR)  Response See new orders  Date of Provider Response 11/16/19  Time of Provider Response [phone]

## 2019-11-16 NOTE — Progress Notes (Signed)
   11/16/19 0311  Assess: MEWS Score  Temp 99.1 F (37.3 C)  BP 131/71  Pulse Rate (!) 114  Resp 18  SpO2 99 %  O2 Device Nasal Cannula  Assess: MEWS Score  MEWS Temp 0  MEWS Systolic 0  MEWS Pulse 2  MEWS RR 0  MEWS LOC 0  MEWS Score 2  MEWS Score Color Yellow  Assess: if the MEWS score is Yellow or Red  Were vital signs taken at a resting state? Yes  Focused Assessment No change from prior assessment  Early Detection of Sepsis Score *See Row Information* Low  MEWS guidelines implemented *See Row Information* No, previously yellow, continue vital signs every 4 hours  Treat  MEWS Interventions Other (Comment)  Take Vital Signs  Increase Vital Sign Frequency  Yellow: Q 2hr X 2 then Q 4hr X 2, if remains yellow, continue Q 4hrs  Escalate  MEWS: Escalate Yellow: discuss with charge nurse/RN and consider discussing with provider and RRT  Notify: Charge Nurse/RN  Name of Charge Nurse/RN Notified Rosalene Billings   Date Charge Nurse/RN Notified 11/16/19  Time Charge Nurse/RN Notified [phone]

## 2019-11-16 NOTE — Progress Notes (Signed)
   Subjective: 2 Days Post-Op Procedure(s) (LRB): Revision patella and tibial polyethylene (Right) Patient reports pain as 5 on 0-10 scale. Patient is doing well . Nausea resolved. HR elevated with PT. Resting HR this am 100. Denies any CP, SOB, ABD pain. We will continue therapy today.  Plan is to go Home after hospital stay.   Objective: Vital signs in last 24 hours: Temp:  [98.3 F (36.8 C)-99.5 F (37.5 C)] 99.5 F (37.5 C) (08/26 0504) Pulse Rate:  [105-124] 124 (08/26 0504) Resp:  [16-18] 18 (08/26 0504) BP: (123-134)/(60-73) 127/60 (08/26 0504) SpO2:  [94 %-100 %] 97 % (08/26 0504)  Intake/Output from previous day: 08/25 0701 - 08/26 0700 In: 534.4 [P.O.:240; I.V.:294.4] Out: -  Intake/Output this shift: No intake/output data recorded.  Recent Labs    11/14/19 1513 11/15/19 0604 11/16/19 0424  HGB 13.5 12.4 11.6*   Recent Labs    11/15/19 0604 11/16/19 0424  WBC 13.8* 10.8*  RBC 4.11 3.80*  HCT 36.8 34.4*  PLT 189 199   Recent Labs    11/15/19 0604 11/16/19 0424  NA 128* 136  K 4.6 4.6  CL 94* 99  CO2 27 29  BUN 11 15  CREATININE 0.55 0.54  GLUCOSE 147* 126*  CALCIUM 8.6* 8.3*   No results for input(s): LABPT, INR in the last 72 hours.  EXAM General - Patient is Alert, Appropriate and Oriented  Cardiac - hr 100, regular rhythm Extremity - Neurovascular intact Sensation intact distally Intact pulses distally Dorsiflexion/Plantar flexion intact No cellulitis present Compartment soft Dressing - dressing C/D/I and no drainage Motor Function - intact, moving foot and toes well on exam.   Past Medical History:  Diagnosis Date  . Acid reflux   . Anxiety   . Arrhythmia    treated with meds and has no current problems  . Arthritis    most uncomfortable in knees  . Asthma    uses several inhalers  . Depression   . Fever blister   . Hematuria   . History of kidney stones   . Hypertension   . Hypoglycemia   . Left flank pain   .  Migraine   . Restless leg   . Yeast vaginitis     Assessment/Plan:   2 Days Post-Op Procedure(s) (LRB): Revision patella and tibial polyethylene (Right) Active Problems:   S/P revision of total knee, right  Estimated body mass index is 30.81 kg/m as calculated from the following:   Height as of this encounter: 5\' 3"  (1.6 m).   Weight as of this encounter: 78.9 kg. Advance diet Up with therapy  Work on bowel movement Vital signs are stable, keep close eye on HR. HR 100. Continue IV fluids.  Hemoglobin and hematocrit stable. Hyponatremia -resolved Nausea resolved Care manager to assist with discharge to home with home health  DVT Prophylaxis - Lovenox, Foot Pumps and TED hose Weight-Bearing as tolerated to right leg   T. Rachelle Hora, PA-C [place] 11/16/2019, 8:45 AM

## 2019-11-17 MED ORDER — METOPROLOL TARTRATE 25 MG PO TABS: 25.0000 mg | ORAL_TABLET | Freq: Two times a day (BID) | ORAL | 0 refills | Status: DC

## 2019-11-17 NOTE — Progress Notes (Signed)
Physical Therapy Treatment Patient Details Name: Summer Murphy MRN: [id number] DOB: 02-07-1956 Today's Date: 11/17/2019    History of Present Illness Patient is admitted for revision of R TKA. PMH includes: OA, DM, GERD, HLD, HTN, RA    PT Comments    Pt was long sitting in bed with bone foam in place and polar care running. She is alert and agreeable to session. She required min assist to exit bed but will have assistance at home to provided required help. She stood without physical assistance and ambulate 200 ft with RW. NO LOB or unsteadiness. Pt does endorse increased pain 8/10 in wt bearing. RN aware of pt's request for pain medication post session. Overall pt is progressing well and is cleared from PT standpoint for safe DC home. Will benefit form continued skilled HHPT to address deficits and progress pt to PLOF. At conclusion of session; pt was in bed, with bone foam in place, call bell in reach, and polar care reapplied. Gait belt issued per pt request.    Follow Up Recommendations  Home health PT;Supervision for mobility/OOB     Equipment Recommendations  None recommended by PT    Recommendations for Other Services       Precautions / Restrictions Precautions Precautions: Knee;Fall Precaution Booklet Issued: Yes (comment) Restrictions Weight Bearing Restrictions: Yes RLE Weight Bearing: Weight bearing as tolerated    Mobility  Bed Mobility Overal bed mobility: Needs Assistance Bed Mobility: Supine to Sit     Supine to sit: Min assist (due to pain) Sit to supine: Min guard   General bed mobility comments: Pt required slightly increased assistance to get OOB due to pain but overall continues to do well. will have assistance at home and states she feels confident she get safely get in/out of bed at DC  Transfers Overall transfer level: Needs assistance Equipment used: Rolling walker (2 wheeled) Transfers: Sit to/from Stand Sit to Stand: Min guard          General transfer comment: CGA for safety to STS from lowest bed height. Vcs for hand placement and increased fwd wt shift  Ambulation/Gait Ambulation/Gait assistance: Min guard;Supervision Gait Distance (Feet): 200 Feet Assistive device: Rolling walker (2 wheeled) Gait Pattern/deviations: Step-through pattern Gait velocity: decreased   General Gait Details: pt was able to safely ambulate 200 ft without LOB or difficulty. Does endorse increased pain this date. RN aware   Stairs             Wheelchair Mobility    Modified Rankin (Stroke Patients Only)       Balance                                            Cognition Arousal/Alertness: Awake/alert Behavior During Therapy: Flat affect Overall Cognitive Status: Within Functional Limits for tasks assessed                                 General Comments: Pt is A and O and cooperative throughout. much less lethargic this date versus previously observed      Exercises Other Exercises Other Exercises: pt states confidence in ability to perform HEP handout    General Comments        Pertinent Vitals/Pain Pain Assessment: 0-10 Pain Score: 8  Faces Pain Scale: Hurts even more  Pain Location: R knee with movements Pain Descriptors / Indicators: Aching;Grimacing;Guarding;Discomfort;Sore Pain Intervention(s): Limited activity within patient's tolerance;Monitored during session;Premedicated before session;Repositioned;Ice applied    Home Living                      Prior Function            PT Goals (current goals can now be found in the care plan section) Acute Rehab PT Goals Patient Stated Goal: to return home Progress towards PT goals: Progressing toward goals    Frequency    BID      PT Plan Current plan remains appropriate    Co-evaluation              AM-PAC PT "6 Clicks" Mobility   Outcome Measure  Help needed turning from your back to your side  while in a flat bed without using bedrails?: A Little Help needed moving from lying on your back to sitting on the side of a flat bed without using bedrails?: A Little Help needed moving to and from a bed to a chair (including a wheelchair)?: A Little Help needed standing up from a chair using your arms (e.g., wheelchair or bedside chair)?: A Little Help needed to walk in hospital room?: A Little Help needed climbing 3-5 steps with a railing? : A Little 6 Click Score: 18    End of Session Equipment Utilized During Treatment: Gait belt Activity Tolerance: Patient tolerated treatment well Patient left: in bed;with call bell/phone within reach;with bed alarm set;with family/visitor present Nurse Communication: Mobility status PT Visit Diagnosis: Muscle weakness (generalized) (M62.81);Other abnormalities of gait and mobility (R26.89);Pain Pain - Right/Left: Right Pain - part of body: Knee     Time: 4193-7902 PT Time Calculation (min) (ACUTE ONLY): 18 min  Charges:  $Gait Training: 8-22 mins                     Julaine Fusi PTA 11/17/19, 9:17 AM

## 2019-11-17 NOTE — Progress Notes (Signed)
   Subjective: 3 Days Post-Op Procedure(s) (LRB): Revision patella and tibial polyethylene (Right) Patient reports pain as 7 on 0-10 scale. Patient is doing well . Nausea resolved. HR improved.Denies any CP, SOB, ABD pain. We will continue therapy today.  Plan is to go Home after hospital stay.   Objective: Vital signs in last 24 hours: Temp:  [98.2 F (36.8 C)-99.5 F (37.5 C)] 98.7 F (37.1 C) (08/27 0350) Pulse Rate:  [88-120] 88 (08/27 0350) Resp:  [15-19] 16 (08/27 0350) BP: (98-120)/(41-77) 103/47 (08/27 0350) SpO2:  [95 %-100 %] 100 % (08/27 0350)  Intake/Output from previous day: 08/26 0701 - 08/27 0700 In: 1630.2 [P.O.:480; I.V.:681.4; IV Piggyback:468.8] Out: -  Intake/Output this shift: No intake/output data recorded.  Recent Labs    11/14/19 1513 11/15/19 0604 11/16/19 0424  HGB 13.5 12.4 11.6*   Recent Labs    11/15/19 0604 11/16/19 0424  WBC 13.8* 10.8*  RBC 4.11 3.80*  HCT 36.8 34.4*  PLT 189 199   Recent Labs    11/15/19 0604 11/16/19 0424  NA 128* 136  K 4.6 4.6  CL 94* 99  CO2 27 29  BUN 11 15  CREATININE 0.55 0.54  GLUCOSE 147* 126*  CALCIUM 8.6* 8.3*   No results for input(s): LABPT, INR in the last 72 hours.  EXAM General - Patient is Alert, Appropriate and Oriented  Extremity - Neurovascular intact Sensation intact distally Intact pulses distally Dorsiflexion/Plantar flexion intact No cellulitis present Compartment soft Dressing - dressing C/D/I and no drainage Motor Function - intact, moving foot and toes well on exam.   Past Medical History:  Diagnosis Date  . Acid reflux   . Anxiety   . Arrhythmia    treated with meds and has no current problems  . Arthritis    most uncomfortable in knees  . Asthma    uses several inhalers  . Depression   . Fever blister   . Hematuria   . History of kidney stones   . Hypertension   . Hypoglycemia   . Left flank pain   . Migraine   . Restless leg   . Yeast vaginitis      Assessment/Plan:   3 Days Post-Op Procedure(s) (LRB): Revision patella and tibial polyethylene (Right) Active Problems:   S/P revision of total knee, right  Estimated body mass index is 30.81 kg/m as calculated from the following:   Height as of this encounter: 5\' 3"  (1.6 m).   Weight as of this encounter: 78.9 kg. Advance diet Up with therapy  Work on bowel movement VSS  Pain well controlled Care manager to assist with discharge to home with home health today pending BM and completion of PT goals  DVT Prophylaxis - Lovenox, Foot Pumps and TED hose Weight-Bearing as tolerated to right leg   T. Rachelle Hora, PA-C [place] 11/17/2019, 8:06 AM

## 2019-11-17 NOTE — Care Management Important Message (Signed)
Important Message  Patient Details  Name: Summer Murphy MRN: [id number] Date of Birth: 1955-09-24   Medicare Important Message Given:  Yes     [name] 11/17/2019, 12:34 PM

## 2019-11-18 DIAGNOSIS — Z9089 Acquired absence of other organs: Secondary | ICD-10-CM | POA: Diagnosis not present

## 2019-11-18 DIAGNOSIS — T84032D Mechanical loosening of internal right knee prosthetic joint, subsequent encounter: Secondary | ICD-10-CM | POA: Diagnosis not present

## 2019-11-18 DIAGNOSIS — M069 Rheumatoid arthritis, unspecified: Secondary | ICD-10-CM | POA: Diagnosis not present

## 2019-11-18 DIAGNOSIS — E119 Type 2 diabetes mellitus without complications: Secondary | ICD-10-CM | POA: Diagnosis not present

## 2019-11-18 DIAGNOSIS — Z86010 Personal history of colonic polyps: Secondary | ICD-10-CM | POA: Diagnosis not present

## 2019-11-18 DIAGNOSIS — Z9049 Acquired absence of other specified parts of digestive tract: Secondary | ICD-10-CM | POA: Diagnosis not present

## 2019-11-18 DIAGNOSIS — J449 Chronic obstructive pulmonary disease, unspecified: Secondary | ICD-10-CM | POA: Diagnosis not present

## 2019-11-18 DIAGNOSIS — G4700 Insomnia, unspecified: Secondary | ICD-10-CM | POA: Diagnosis not present

## 2019-11-18 DIAGNOSIS — N200 Calculus of kidney: Secondary | ICD-10-CM | POA: Diagnosis not present

## 2019-11-18 DIAGNOSIS — M810 Age-related osteoporosis without current pathological fracture: Secondary | ICD-10-CM | POA: Diagnosis not present

## 2019-11-18 DIAGNOSIS — G43909 Migraine, unspecified, not intractable, without status migrainosus: Secondary | ICD-10-CM | POA: Diagnosis not present

## 2019-11-18 DIAGNOSIS — E785 Hyperlipidemia, unspecified: Secondary | ICD-10-CM | POA: Diagnosis not present

## 2019-11-18 DIAGNOSIS — K219 Gastro-esophageal reflux disease without esophagitis: Secondary | ICD-10-CM | POA: Diagnosis not present

## 2019-11-18 DIAGNOSIS — I10 Essential (primary) hypertension: Secondary | ICD-10-CM | POA: Diagnosis not present

## 2019-11-21 DIAGNOSIS — J449 Chronic obstructive pulmonary disease, unspecified: Secondary | ICD-10-CM | POA: Diagnosis not present

## 2019-11-21 DIAGNOSIS — E119 Type 2 diabetes mellitus without complications: Secondary | ICD-10-CM | POA: Diagnosis not present

## 2019-11-21 DIAGNOSIS — Z86010 Personal history of colonic polyps: Secondary | ICD-10-CM | POA: Diagnosis not present

## 2019-11-21 DIAGNOSIS — G4700 Insomnia, unspecified: Secondary | ICD-10-CM | POA: Diagnosis not present

## 2019-11-21 DIAGNOSIS — K219 Gastro-esophageal reflux disease without esophagitis: Secondary | ICD-10-CM | POA: Diagnosis not present

## 2019-11-21 DIAGNOSIS — T84032D Mechanical loosening of internal right knee prosthetic joint, subsequent encounter: Secondary | ICD-10-CM | POA: Diagnosis not present

## 2019-11-21 DIAGNOSIS — G43909 Migraine, unspecified, not intractable, without status migrainosus: Secondary | ICD-10-CM | POA: Diagnosis not present

## 2019-11-21 DIAGNOSIS — Z9089 Acquired absence of other organs: Secondary | ICD-10-CM | POA: Diagnosis not present

## 2019-11-21 DIAGNOSIS — M069 Rheumatoid arthritis, unspecified: Secondary | ICD-10-CM | POA: Diagnosis not present

## 2019-11-21 DIAGNOSIS — E785 Hyperlipidemia, unspecified: Secondary | ICD-10-CM | POA: Diagnosis not present

## 2019-11-21 DIAGNOSIS — N200 Calculus of kidney: Secondary | ICD-10-CM | POA: Diagnosis not present

## 2019-11-21 DIAGNOSIS — Z9049 Acquired absence of other specified parts of digestive tract: Secondary | ICD-10-CM | POA: Diagnosis not present

## 2019-11-21 DIAGNOSIS — M810 Age-related osteoporosis without current pathological fracture: Secondary | ICD-10-CM | POA: Diagnosis not present

## 2019-11-21 DIAGNOSIS — I10 Essential (primary) hypertension: Secondary | ICD-10-CM | POA: Diagnosis not present

## 2019-11-22 ENCOUNTER — Other Ambulatory Visit: Payer: Self-pay

## 2019-11-22 DIAGNOSIS — K219 Gastro-esophageal reflux disease without esophagitis: Secondary | ICD-10-CM | POA: Diagnosis not present

## 2019-11-22 DIAGNOSIS — M810 Age-related osteoporosis without current pathological fracture: Secondary | ICD-10-CM | POA: Diagnosis not present

## 2019-11-22 DIAGNOSIS — E785 Hyperlipidemia, unspecified: Secondary | ICD-10-CM | POA: Diagnosis not present

## 2019-11-22 DIAGNOSIS — G43909 Migraine, unspecified, not intractable, without status migrainosus: Secondary | ICD-10-CM | POA: Diagnosis not present

## 2019-11-22 DIAGNOSIS — N200 Calculus of kidney: Secondary | ICD-10-CM | POA: Diagnosis not present

## 2019-11-22 DIAGNOSIS — J4541 Moderate persistent asthma with (acute) exacerbation: Secondary | ICD-10-CM

## 2019-11-22 DIAGNOSIS — Z9049 Acquired absence of other specified parts of digestive tract: Secondary | ICD-10-CM | POA: Diagnosis not present

## 2019-11-22 DIAGNOSIS — G63 Polyneuropathy in diseases classified elsewhere: Secondary | ICD-10-CM

## 2019-11-22 DIAGNOSIS — Z86010 Personal history of colonic polyps: Secondary | ICD-10-CM | POA: Diagnosis not present

## 2019-11-22 DIAGNOSIS — I10 Essential (primary) hypertension: Secondary | ICD-10-CM | POA: Diagnosis not present

## 2019-11-22 DIAGNOSIS — J449 Chronic obstructive pulmonary disease, unspecified: Secondary | ICD-10-CM | POA: Diagnosis not present

## 2019-11-22 DIAGNOSIS — T84032D Mechanical loosening of internal right knee prosthetic joint, subsequent encounter: Secondary | ICD-10-CM | POA: Diagnosis not present

## 2019-11-22 DIAGNOSIS — M069 Rheumatoid arthritis, unspecified: Secondary | ICD-10-CM | POA: Diagnosis not present

## 2019-11-22 DIAGNOSIS — Z9089 Acquired absence of other organs: Secondary | ICD-10-CM | POA: Diagnosis not present

## 2019-11-22 DIAGNOSIS — E119 Type 2 diabetes mellitus without complications: Secondary | ICD-10-CM | POA: Diagnosis not present

## 2019-11-22 DIAGNOSIS — G4700 Insomnia, unspecified: Secondary | ICD-10-CM | POA: Diagnosis not present

## 2019-11-22 MED ORDER — GABAPENTIN 600 MG PO TABS: 600.0000 mg | ORAL_TABLET | Freq: Two times a day (BID) | ORAL | 3 refills | Status: DC

## 2019-11-22 MED ORDER — SPIRIVA RESPIMAT 1.25 MCG/ACT IN AERS: 2.0000 | INHALATION_SPRAY | Freq: Every day | RESPIRATORY_TRACT | 3 refills | Status: DC

## 2019-11-23 ENCOUNTER — Telehealth: Payer: Self-pay | Admitting: *Deleted

## 2019-11-23 NOTE — Telephone Encounter (Signed)
Contacted Dr Trish Mage office ,spoke with Loma Sousa to follow up with patient to schedule an appointment .  Summer Murphy   PEC  [phone]

## 2019-11-24 DIAGNOSIS — E785 Hyperlipidemia, unspecified: Secondary | ICD-10-CM | POA: Diagnosis not present

## 2019-11-24 DIAGNOSIS — K219 Gastro-esophageal reflux disease without esophagitis: Secondary | ICD-10-CM | POA: Diagnosis not present

## 2019-11-24 DIAGNOSIS — G4700 Insomnia, unspecified: Secondary | ICD-10-CM | POA: Diagnosis not present

## 2019-11-24 DIAGNOSIS — E119 Type 2 diabetes mellitus without complications: Secondary | ICD-10-CM | POA: Diagnosis not present

## 2019-11-24 DIAGNOSIS — I10 Essential (primary) hypertension: Secondary | ICD-10-CM | POA: Diagnosis not present

## 2019-11-24 DIAGNOSIS — M810 Age-related osteoporosis without current pathological fracture: Secondary | ICD-10-CM | POA: Diagnosis not present

## 2019-11-24 DIAGNOSIS — N200 Calculus of kidney: Secondary | ICD-10-CM | POA: Diagnosis not present

## 2019-11-24 DIAGNOSIS — Z86010 Personal history of colonic polyps: Secondary | ICD-10-CM | POA: Diagnosis not present

## 2019-11-24 DIAGNOSIS — J449 Chronic obstructive pulmonary disease, unspecified: Secondary | ICD-10-CM | POA: Diagnosis not present

## 2019-11-24 DIAGNOSIS — M069 Rheumatoid arthritis, unspecified: Secondary | ICD-10-CM | POA: Diagnosis not present

## 2019-11-24 DIAGNOSIS — Z9089 Acquired absence of other organs: Secondary | ICD-10-CM | POA: Diagnosis not present

## 2019-11-24 DIAGNOSIS — T84032D Mechanical loosening of internal right knee prosthetic joint, subsequent encounter: Secondary | ICD-10-CM | POA: Diagnosis not present

## 2019-11-24 DIAGNOSIS — G43909 Migraine, unspecified, not intractable, without status migrainosus: Secondary | ICD-10-CM | POA: Diagnosis not present

## 2019-11-24 DIAGNOSIS — Z9049 Acquired absence of other specified parts of digestive tract: Secondary | ICD-10-CM | POA: Diagnosis not present

## 2019-11-25 DIAGNOSIS — E119 Type 2 diabetes mellitus without complications: Secondary | ICD-10-CM | POA: Diagnosis not present

## 2019-11-25 DIAGNOSIS — G43909 Migraine, unspecified, not intractable, without status migrainosus: Secondary | ICD-10-CM | POA: Diagnosis not present

## 2019-11-25 DIAGNOSIS — M069 Rheumatoid arthritis, unspecified: Secondary | ICD-10-CM | POA: Diagnosis not present

## 2019-11-25 DIAGNOSIS — G4700 Insomnia, unspecified: Secondary | ICD-10-CM | POA: Diagnosis not present

## 2019-11-25 DIAGNOSIS — M810 Age-related osteoporosis without current pathological fracture: Secondary | ICD-10-CM | POA: Diagnosis not present

## 2019-11-25 DIAGNOSIS — E785 Hyperlipidemia, unspecified: Secondary | ICD-10-CM | POA: Diagnosis not present

## 2019-11-25 DIAGNOSIS — N200 Calculus of kidney: Secondary | ICD-10-CM | POA: Diagnosis not present

## 2019-11-25 DIAGNOSIS — Z9089 Acquired absence of other organs: Secondary | ICD-10-CM | POA: Diagnosis not present

## 2019-11-25 DIAGNOSIS — Z9049 Acquired absence of other specified parts of digestive tract: Secondary | ICD-10-CM | POA: Diagnosis not present

## 2019-11-25 DIAGNOSIS — J449 Chronic obstructive pulmonary disease, unspecified: Secondary | ICD-10-CM | POA: Diagnosis not present

## 2019-11-25 DIAGNOSIS — K219 Gastro-esophageal reflux disease without esophagitis: Secondary | ICD-10-CM | POA: Diagnosis not present

## 2019-11-25 DIAGNOSIS — T84032D Mechanical loosening of internal right knee prosthetic joint, subsequent encounter: Secondary | ICD-10-CM | POA: Diagnosis not present

## 2019-11-25 DIAGNOSIS — Z86010 Personal history of colonic polyps: Secondary | ICD-10-CM | POA: Diagnosis not present

## 2019-11-25 DIAGNOSIS — I10 Essential (primary) hypertension: Secondary | ICD-10-CM | POA: Diagnosis not present

## 2019-11-26 DIAGNOSIS — Z9089 Acquired absence of other organs: Secondary | ICD-10-CM | POA: Diagnosis not present

## 2019-11-26 DIAGNOSIS — T84032D Mechanical loosening of internal right knee prosthetic joint, subsequent encounter: Secondary | ICD-10-CM | POA: Diagnosis not present

## 2019-11-26 DIAGNOSIS — N200 Calculus of kidney: Secondary | ICD-10-CM | POA: Diagnosis not present

## 2019-11-26 DIAGNOSIS — J449 Chronic obstructive pulmonary disease, unspecified: Secondary | ICD-10-CM | POA: Diagnosis not present

## 2019-11-26 DIAGNOSIS — G43909 Migraine, unspecified, not intractable, without status migrainosus: Secondary | ICD-10-CM | POA: Diagnosis not present

## 2019-11-26 DIAGNOSIS — M810 Age-related osteoporosis without current pathological fracture: Secondary | ICD-10-CM | POA: Diagnosis not present

## 2019-11-26 DIAGNOSIS — M069 Rheumatoid arthritis, unspecified: Secondary | ICD-10-CM | POA: Diagnosis not present

## 2019-11-26 DIAGNOSIS — K219 Gastro-esophageal reflux disease without esophagitis: Secondary | ICD-10-CM | POA: Diagnosis not present

## 2019-11-26 DIAGNOSIS — Z86010 Personal history of colonic polyps: Secondary | ICD-10-CM | POA: Diagnosis not present

## 2019-11-26 DIAGNOSIS — G4700 Insomnia, unspecified: Secondary | ICD-10-CM | POA: Diagnosis not present

## 2019-11-26 DIAGNOSIS — E785 Hyperlipidemia, unspecified: Secondary | ICD-10-CM | POA: Diagnosis not present

## 2019-11-26 DIAGNOSIS — I10 Essential (primary) hypertension: Secondary | ICD-10-CM | POA: Diagnosis not present

## 2019-11-26 DIAGNOSIS — Z9049 Acquired absence of other specified parts of digestive tract: Secondary | ICD-10-CM | POA: Diagnosis not present

## 2019-11-26 DIAGNOSIS — E119 Type 2 diabetes mellitus without complications: Secondary | ICD-10-CM | POA: Diagnosis not present

## 2019-11-28 ENCOUNTER — Telehealth: Payer: Self-pay

## 2019-11-28 DIAGNOSIS — M25661 Stiffness of right knee, not elsewhere classified: Secondary | ICD-10-CM | POA: Diagnosis not present

## 2019-11-28 DIAGNOSIS — M25561 Pain in right knee: Secondary | ICD-10-CM | POA: Diagnosis not present

## 2019-11-28 DIAGNOSIS — Z96651 Presence of right artificial knee joint: Secondary | ICD-10-CM | POA: Diagnosis not present

## 2019-11-28 DIAGNOSIS — R29898 Other symptoms and signs involving the musculoskeletal system: Secondary | ICD-10-CM | POA: Diagnosis not present

## 2019-11-28 NOTE — Telephone Encounter (Signed)
confirmed and screened for OV on 9/8

## 2019-11-29 ENCOUNTER — Ambulatory Visit (INDEPENDENT_AMBULATORY_CARE_PROVIDER_SITE_OTHER): Payer: Medicaid Other | Admitting: Hospice and Palliative Medicine

## 2019-11-29 ENCOUNTER — Other Ambulatory Visit: Payer: Self-pay

## 2019-11-29 VITALS — BP 137/56 | HR 102 | Temp 98.4°F | Resp 16 | Ht 63.0 in | Wt 171.4 lb

## 2019-11-29 DIAGNOSIS — Z23 Encounter for immunization: Secondary | ICD-10-CM | POA: Diagnosis not present

## 2019-11-29 DIAGNOSIS — I10 Essential (primary) hypertension: Secondary | ICD-10-CM

## 2019-11-29 DIAGNOSIS — J45909 Unspecified asthma, uncomplicated: Secondary | ICD-10-CM | POA: Diagnosis not present

## 2019-11-29 DIAGNOSIS — Z13220 Encounter for screening for lipoid disorders: Secondary | ICD-10-CM

## 2019-11-29 DIAGNOSIS — Z09 Encounter for follow-up examination after completed treatment for conditions other than malignant neoplasm: Secondary | ICD-10-CM

## 2019-11-29 MED ORDER — BREO ELLIPTA 200-25 MCG/INH IN AEPB: 1.0000 | INHALATION_SPRAY | Freq: Every day | RESPIRATORY_TRACT | 3 refills | Status: DC

## 2019-11-29 NOTE — Progress Notes (Signed)
[facility] [place], [place] [address]  Internal MEDICINE  Office Visit Note  Patient Name: Summer Murphy  [id number]  [id number]  Date of Service: 12/01/2019     Chief Complaint  Patient presents with  . Follow-up    follow up with knee surgery  . Depression  . Hypertension  . Quality Metric Gaps    HepC, TDAP     HPI Pt is here for recent hospital follow up. She was admitted 11/14/2019-11/17/2019 for right total knee replacement. Surgery was successful and no further complications during hospital course. She is feeling great, undergoing PT for her knee, says her pain is being well controlled  Current Medication: Outpatient Encounter Medications as of 11/29/2019  Medication Sig  . BIOTIN PO Take 1 tablet by mouth daily.   . Brexpiprazole (REXULTI) 4 MG TABS Take 4 mg by mouth daily.   . busPIRone (BUSPAR) 10 MG tablet Take 10 mg by mouth 2 (two) times daily.   . cetirizine (ZYRTEC) 10 MG tablet Take 1 tablet (10 mg total) by mouth daily.  . diclofenac sodium (VOLTAREN) 1 % GEL Apply 2 g topically in the morning, at noon, and at bedtime.   Marland Kitchen diltiazem (DILACOR XR) 180 MG 24 hr capsule Take 1 capsule (180 mg total) by mouth daily.  . diphenhydrAMINE (BENADRYL) 25 MG tablet Take 50 mg by mouth 2 (two) times daily as needed.   . diphenoxylate-atropine (LOMOTIL) 2.5-0.025 MG tablet Take 1 tablet by mouth 3 (three) times daily as needed for diarrhea or loose stools.  . docusate sodium (COLACE) 100 MG capsule Take 1 capsule (100 mg total) by mouth 2 (two) times daily.  . DULoxetine (CYMBALTA) 60 MG capsule Take 60 mg by mouth daily.  Marland Kitchen enoxaparin (LOVENOX) 40 MG/0.4ML injection Inject 0.4 mLs (40 mg total) into the skin daily for 14 days.  Marland Kitchen gabapentin (NEURONTIN) 600 MG tablet Take 1 tablet (600 mg total) by mouth 2 (two) times daily.  . Ipratropium-Albuterol (COMBIVENT RESPIMAT) 20-100 MCG/ACT AERS respimat INHALE 1 PUFF INTO THE LUNGS EVERY 6 HOURS   . ipratropium-albuterol (DUONEB) 0.5-2.5 (3) MG/3ML SOLN Take 3 mLs by nebulization every 4 (four) hours as needed (for shortness of breath/wheezing).  Marland Kitchen ketoconazole (NIZORAL) 2 % cream Apply 1 application topically daily. (Patient taking differently: Apply 1 application topically daily as needed for irritation. )  . LORazepam (ATIVAN) 1 MG tablet Take 1 mg by mouth at bedtime as needed for sleep.   . metoprolol tartrate (LOPRESSOR) 25 MG tablet Take 1 tablet (25 mg total) by mouth 2 (two) times daily.  . montelukast (SINGULAIR) 10 MG tablet TAKE 1 TABLET(10 MG) BY MOUTH AT BEDTIME (Patient taking differently: Take 10 mg by mouth at bedtime. )  . ondansetron (ZOFRAN) 4 MG tablet Take 1 tablet (4 mg total) by mouth every 6 (six) hours as needed for nausea.  Marland Kitchen oxybutynin (DITROPAN) 5 MG tablet Take 5 mg by mouth in the morning and at bedtime.  Marland Kitchen oxyCODONE (OXY IR/ROXICODONE) 5 MG immediate release tablet Take 1-2 tablets (5-10 mg total) by mouth every 4 (four) hours as needed for moderate pain or severe pain.  Marland Kitchen rOPINIRole (REQUIP) 0.5 MG tablet Take two tablets po BID for restless legs. (Patient taking differently: Take 1 mg by mouth in the morning and at bedtime. )  . SUMAtriptan (IMITREX) 100 MG tablet Take 1 tablet (100 mg total) by mouth every 2 (two) hours as needed (for migraine headaches.). May repeat  in 1 hours if headache persists or recurs.  . Tiotropium Bromide Monohydrate (SPIRIVA RESPIMAT) 1.25 MCG/ACT AERS Inhale 2 puffs into the lungs daily.  . traMADol (ULTRAM) 50 MG tablet Take 50 mg by mouth every 6 (six) hours as needed for moderate pain.  . traZODone (DESYREL) 150 MG tablet Take 150-300 mg by mouth at bedtime.   . valACYclovir (VALTREX) 500 MG tablet Take 500 mg by mouth daily as needed (for fever blisters).  . [DISCONTINUED] budesonide-formoterol (SYMBICORT) 160-4.5 MCG/ACT inhaler INHALE 2 PUFFS BY MOUTH TWICE DAILY AS NEEDED FOR RESPIRATORY ISSUES (Patient taking differently:  Inhale 1 puff into the lungs in the morning and at bedtime. INHALE 2 PUFFS BY MOUTH TWICE DAILY AS NEEDED FOR RESPIRATORY ISSUES)  . fluticasone furoate-vilanterol (BREO ELLIPTA) 200-25 MCG/INH AEPB Inhale 1 puff into the lungs daily.  . [DISCONTINUED] omeprazole (PRILOSEC) 40 MG capsule Take 40 mg by mouth in the morning and at bedtime. (Patient not taking: Reported on 11/07/2019)  . [DISCONTINUED] ondansetron (ZOFRAN) 4 MG tablet Take 1 tablet (4 mg total) by mouth every 8 (eight) hours as needed for nausea or vomiting. (Patient not taking: Reported on 10/24/2019)  . [DISCONTINUED] pantoprazole (PROTONIX) 40 MG tablet Take 40 mg by mouth 2 (two) times daily before a meal. (Patient not taking: Reported on 10/24/2019)   No facility-administered encounter medications on file as of 11/29/2019.    Surgical History: Past Surgical History:  Procedure Laterality Date  . ABDOMINAL HYSTERECTOMY    . AMPUTATION TOE Left 07/31/2016   Procedure: AMPUTATION TOE/MPJ 2nd toe;  Surgeon: Sharlotte Alamo, DPM;  Location: [facility] ORS;  Service: Podiatry;  Laterality: Left;  . APPENDECTOMY  1990  . BACK SURGERY     low back  . BREAST SURGERY     bilateral breast reduction  . CARDIAC ELECTROPHYSIOLOGY STUDY AND ABLATION    . CHOLECYSTECTOMY  1990  . COLONOSCOPY WITH PROPOFOL N/A 01/04/2017   Procedure: COLONOSCOPY WITH PROPOFOL;  Surgeon: Manya Silvas, MD;  Location: [facility] ENDOSCOPY;  Service: Endoscopy;  Laterality: N/A;  . CORNEAL TRANSPLANT    . ESOPHAGOGASTRODUODENOSCOPY (EGD) WITH PROPOFOL N/A 01/04/2017   Procedure: ESOPHAGOGASTRODUODENOSCOPY (EGD) WITH PROPOFOL;  Surgeon: Manya Silvas, MD;  Location: [facility] ENDOSCOPY;  Service: Endoscopy;  Laterality: N/A;  . EXCISION BONE CYST Left 07/31/2016   Procedure: EXCISION BONE CYST/exostectomy 28124/left 2nd;  Surgeon: Sharlotte Alamo, DPM;  Location: [facility] ORS;  Service: Podiatry;  Laterality: Left;  . EXTRACORPOREAL SHOCK WAVE LITHOTRIPSY Left 09/12/2015   Procedure:  EXTRACORPOREAL SHOCK WAVE LITHOTRIPSY (ESWL);  Surgeon: Hollice Espy, MD;  Location: [facility] ORS;  Service: Urology;  Laterality: Left;  . FRACTURE SURGERY     left foot  . HH repair    . JOINT REPLACEMENT Bilateral 2013,2014   total knees  . LAPAROSCOPIC HYSTERECTOMY    . LITHOTRIPSY    . TONSILLECTOMY    . TOTAL KNEE REVISION Right 11/14/2019   Procedure: Revision patella and tibial polyethylene;  Surgeon: Hessie Knows, MD;  Location: [facility] ORS;  Service: Orthopedics;  Laterality: Right;  . URETEROSCOPY      Medical History: Past Medical History:  Diagnosis Date  . Acid reflux   . Anxiety   . Arrhythmia    treated with meds and has no current problems  . Arthritis    most uncomfortable in knees  . Asthma    uses several inhalers  . Depression   . Fever blister   . Hematuria   . History of kidney stones   .  Hypertension   . Hypoglycemia   . Left flank pain   . Migraine   . Restless leg   . Yeast vaginitis     Family History: Family History  Problem Relation Age of Onset  . Prostate cancer Father   . Kidney Stones Father   . Kidney disease Neg Hx     Social History   Socioeconomic History  . Marital status: Married    Spouse name: Not on file  . Number of children: Not on file  . Years of education: Not on file  . Highest education level: Not on file  Occupational History  . Not on file  Tobacco Use  . Smoking status: Never Smoker  . Smokeless tobacco: Never Used  Vaping Use  . Vaping Use: Never used  Substance and Sexual Activity  . Alcohol use: No  . Drug use: No  . Sexual activity: Not on file  Other Topics Concern  . Not on file  Social History Narrative  . Not on file   Social Determinants of Health   Financial Resource Strain:   . Difficulty of Paying Living Expenses: Not on file  Food Insecurity:   . Worried About Charity fundraiser in the Last Year: Not on file  . Ran Out of Food in the Last Year: Not on file  Transportation Needs:    . Lack of Transportation (Medical): Not on file  . Lack of Transportation (Non-Medical): Not on file  Physical Activity:   . Days of Exercise per Week: Not on file  . Minutes of Exercise per Session: Not on file  Stress:   . Feeling of Stress : Not on file  Social Connections:   . Frequency of Communication with Friends and Family: Not on file  . Frequency of Social Gatherings with Friends and Family: Not on file  . Attends Religious Services: Not on file  . Active Member of Clubs or Organizations: Not on file  . Attends Archivist Meetings: Not on file  . Marital Status: Not on file  Intimate Partner Violence:   . Fear of Current or Ex-Partner: Not on file  . Emotionally Abused: Not on file  . Physically Abused: Not on file  . Sexually Abused: Not on file    Review of Systems  Constitutional: Negative for chills, diaphoresis and fatigue.  HENT: Negative for ear pain, postnasal drip and sinus pressure.   Eyes: Negative for photophobia, discharge, redness, itching and visual disturbance.  Respiratory: Negative for cough, shortness of breath and wheezing.   Cardiovascular: Negative for chest pain, palpitations and leg swelling.  Gastrointestinal: Negative for abdominal pain, constipation, diarrhea, nausea and vomiting.  Genitourinary: Negative for dysuria and flank pain.  Musculoskeletal: Negative for arthralgias, back pain, gait problem and neck pain.  Skin: Negative for color change.  Allergic/Immunologic: Negative for environmental allergies and food allergies.  Neurological: Negative for dizziness and headaches.  Hematological: Does not bruise/bleed easily.  Psychiatric/Behavioral: Negative for agitation, behavioral problems (depression) and hallucinations.    Vital Signs: BP (!) 137/56   Pulse (!) 102   Temp 98.4 F (36.9 C)   Resp 16   Ht 5\' 3"  (1.6 m)   Wt 171 lb 6.4 oz (77.7 kg)   SpO2 95%   BMI 30.36 kg/m    Physical Exam Vitals reviewed.   Constitutional:      Appearance: Normal appearance.  HENT:     Mouth/Throat:     Mouth: Mucous membranes are moist.  Pharynx: Oropharynx is clear.  Cardiovascular:     Rate and Rhythm: Normal rate and regular rhythm.     Pulses: Normal pulses.     Heart sounds: Normal heart sounds.  Pulmonary:     Effort: Pulmonary effort is normal.     Breath sounds: Normal breath sounds.  Abdominal:     General: Abdomen is flat.     Palpations: Abdomen is soft.  Musculoskeletal:        General: Normal range of motion.     Cervical back: Normal range of motion.  Skin:    General: Skin is warm.  Neurological:     General: No focal deficit present.     Mental Status: She is alert and oriented to person, place, and time. Mental status is at baseline.  Psychiatric:        Mood and Affect: Mood normal.        Behavior: Behavior normal.        Thought Content: Thought content normal.    Assessment/Plan: 1. Hospital discharge follow-up Discharged 08/27 following right total knee repair, no complications during hospital stay. Labs reviewed while hospitalized, stable.  2. Essential hypertension BP and HR stable today, continue with current therapy and routine monitoring.  3. Moderate asthma without complication, unspecified whether persistent While hospitalized her inhaler was changed to [facility] for her asthma. She has noticed improvement in her symptoms and is requesting refills today. - fluticasone furoate-vilanterol (BREO ELLIPTA) 200-25 MCG/INH AEPB; Inhale 1 puff into the lungs daily.  Dispense: 60 each; Refill: 3  4. Screening for hyperlipidemia Will obtain baseline lipid panel, will need tight control of lipids due to underlying cardiac history. - Lipid Panel With LDL/HDL Ratio  5. Flu vaccine need - Flu Vaccine MDCK QUAD PF  General Counseling: Lile verbalizes understanding of the findings of todays visit and agrees with plan of treatment. I have discussed any further diagnostic  evaluation that may be needed or ordered today. We also reviewed her medications today. she has been encouraged to call the office with any questions or concerns that should arise related to todays visit.   Orders Placed This Encounter  Procedures  . Flu Vaccine MDCK QUAD PF  . Lipid Panel With LDL/HDL Ratio   I have reviewed all medical records from hospital follow up including radiology reports and consults from other physicians. Appropriate follow up diagnostics will be scheduled as needed. Patient/ Family understands the plan of treatment.   Time spent 30 minutes.  This patient was seen by Theodoro Grist AGNP-C in Collaboration with Dr Lavera Guise as a part of collaborative care agreement  Tanna Furry. [facility] Internal Medicine

## 2019-11-30 DIAGNOSIS — Z96651 Presence of right artificial knee joint: Secondary | ICD-10-CM | POA: Diagnosis not present

## 2019-11-30 DIAGNOSIS — M25561 Pain in right knee: Secondary | ICD-10-CM | POA: Diagnosis not present

## 2019-12-01 ENCOUNTER — Encounter: Payer: Self-pay | Admitting: Hospice and Palliative Medicine

## 2019-12-02 NOTE — Addendum Note (Signed)
Addended by: Lavera Guise on: 12/02/2019 05:57 PM   Modules accepted: Level of Service

## 2019-12-04 DIAGNOSIS — M25561 Pain in right knee: Secondary | ICD-10-CM | POA: Diagnosis not present

## 2019-12-04 DIAGNOSIS — Z96651 Presence of right artificial knee joint: Secondary | ICD-10-CM | POA: Diagnosis not present

## 2019-12-08 DIAGNOSIS — Z96651 Presence of right artificial knee joint: Secondary | ICD-10-CM | POA: Diagnosis not present

## 2019-12-11 DIAGNOSIS — M25561 Pain in right knee: Secondary | ICD-10-CM | POA: Diagnosis not present

## 2019-12-11 DIAGNOSIS — Z96651 Presence of right artificial knee joint: Secondary | ICD-10-CM | POA: Diagnosis not present

## 2019-12-15 DIAGNOSIS — Z96651 Presence of right artificial knee joint: Secondary | ICD-10-CM | POA: Diagnosis not present

## 2019-12-15 DIAGNOSIS — M25561 Pain in right knee: Secondary | ICD-10-CM | POA: Diagnosis not present

## 2019-12-18 ENCOUNTER — Ambulatory Visit: Payer: Medicare Other | Admitting: Nurse Practitioner

## 2019-12-19 DIAGNOSIS — Z96651 Presence of right artificial knee joint: Secondary | ICD-10-CM | POA: Diagnosis not present

## 2019-12-19 DIAGNOSIS — M25561 Pain in right knee: Secondary | ICD-10-CM | POA: Diagnosis not present

## 2019-12-21 DIAGNOSIS — M25561 Pain in right knee: Secondary | ICD-10-CM | POA: Diagnosis not present

## 2019-12-21 DIAGNOSIS — Z96651 Presence of right artificial knee joint: Secondary | ICD-10-CM | POA: Diagnosis not present

## 2019-12-29 DIAGNOSIS — M25561 Pain in right knee: Secondary | ICD-10-CM | POA: Diagnosis not present

## 2020-01-01 ENCOUNTER — Ambulatory Visit: Payer: Medicaid Other | Admitting: Nurse Practitioner

## 2020-01-05 DIAGNOSIS — Z96651 Presence of right artificial knee joint: Secondary | ICD-10-CM | POA: Diagnosis not present

## 2020-01-05 DIAGNOSIS — M25561 Pain in right knee: Secondary | ICD-10-CM | POA: Diagnosis not present

## 2020-01-08 DIAGNOSIS — Z96651 Presence of right artificial knee joint: Secondary | ICD-10-CM | POA: Diagnosis not present

## 2020-01-22 DIAGNOSIS — Z961 Presence of intraocular lens: Secondary | ICD-10-CM | POA: Diagnosis not present

## 2020-01-30 DIAGNOSIS — Z947 Corneal transplant status: Secondary | ICD-10-CM | POA: Diagnosis not present

## 2020-01-30 DIAGNOSIS — K222 Esophageal obstruction: Secondary | ICD-10-CM | POA: Diagnosis not present

## 2020-01-30 DIAGNOSIS — Z9889 Other specified postprocedural states: Secondary | ICD-10-CM | POA: Diagnosis not present

## 2020-01-30 DIAGNOSIS — K2270 Barrett's esophagus without dysplasia: Secondary | ICD-10-CM | POA: Diagnosis not present

## 2020-01-30 DIAGNOSIS — K209 Esophagitis, unspecified without bleeding: Secondary | ICD-10-CM | POA: Diagnosis not present

## 2020-01-30 DIAGNOSIS — Z882 Allergy status to sulfonamides status: Secondary | ICD-10-CM | POA: Diagnosis not present

## 2020-01-30 DIAGNOSIS — Z538 Procedure and treatment not carried out for other reasons: Secondary | ICD-10-CM | POA: Diagnosis not present

## 2020-01-30 DIAGNOSIS — I10 Essential (primary) hypertension: Secondary | ICD-10-CM | POA: Diagnosis not present

## 2020-01-31 DIAGNOSIS — Z79899 Other long term (current) drug therapy: Secondary | ICD-10-CM | POA: Diagnosis not present

## 2020-02-16 ENCOUNTER — Other Ambulatory Visit: Payer: Self-pay

## 2020-02-16 ENCOUNTER — Emergency Department
Admission: EM | Admit: 2020-02-16 | Discharge: 2020-02-16 | Disposition: A | Discharge status: Other Acute Inpatient Hospital | Payer: Medicare Other | Attending: Emergency Medicine | Admitting: Emergency Medicine

## 2020-02-16 ENCOUNTER — Emergency Department: Payer: Medicare Other

## 2020-02-16 DIAGNOSIS — R410 Disorientation, unspecified: Secondary | ICD-10-CM | POA: Diagnosis not present

## 2020-02-16 DIAGNOSIS — Z96651 Presence of right artificial knee joint: Secondary | ICD-10-CM | POA: Diagnosis not present

## 2020-02-16 DIAGNOSIS — S72402A Unspecified fracture of lower end of left femur, initial encounter for closed fracture: Secondary | ICD-10-CM | POA: Diagnosis not present

## 2020-02-16 DIAGNOSIS — I5030 Unspecified diastolic (congestive) heart failure: Secondary | ICD-10-CM | POA: Diagnosis not present

## 2020-02-16 DIAGNOSIS — Y9289 Other specified places as the place of occurrence of the external cause: Secondary | ICD-10-CM | POA: Diagnosis not present

## 2020-02-16 DIAGNOSIS — J441 Chronic obstructive pulmonary disease with (acute) exacerbation: Secondary | ICD-10-CM | POA: Diagnosis not present

## 2020-02-16 DIAGNOSIS — Z7409 Other reduced mobility: Secondary | ICD-10-CM | POA: Diagnosis not present

## 2020-02-16 DIAGNOSIS — G9340 Encephalopathy, unspecified: Secondary | ICD-10-CM | POA: Diagnosis not present

## 2020-02-16 DIAGNOSIS — E785 Hyperlipidemia, unspecified: Secondary | ICD-10-CM | POA: Diagnosis not present

## 2020-02-16 DIAGNOSIS — M2550 Pain in unspecified joint: Secondary | ICD-10-CM | POA: Diagnosis not present

## 2020-02-16 DIAGNOSIS — W010XXA Fall on same level from slipping, tripping and stumbling without subsequent striking against object, initial encounter: Secondary | ICD-10-CM | POA: Diagnosis not present

## 2020-02-16 DIAGNOSIS — E1165 Type 2 diabetes mellitus with hyperglycemia: Secondary | ICD-10-CM | POA: Diagnosis not present

## 2020-02-16 DIAGNOSIS — Z79899 Other long term (current) drug therapy: Secondary | ICD-10-CM | POA: Insufficient documentation

## 2020-02-16 DIAGNOSIS — M25562 Pain in left knee: Secondary | ICD-10-CM | POA: Diagnosis not present

## 2020-02-16 DIAGNOSIS — G928 Other toxic encephalopathy: Secondary | ICD-10-CM | POA: Diagnosis not present

## 2020-02-16 DIAGNOSIS — Z20822 Contact with and (suspected) exposure to covid-19: Secondary | ICD-10-CM | POA: Diagnosis not present

## 2020-02-16 DIAGNOSIS — Z7951 Long term (current) use of inhaled steroids: Secondary | ICD-10-CM | POA: Insufficient documentation

## 2020-02-16 DIAGNOSIS — G4700 Insomnia, unspecified: Secondary | ICD-10-CM | POA: Diagnosis not present

## 2020-02-16 DIAGNOSIS — M810 Age-related osteoporosis without current pathological fracture: Secondary | ICD-10-CM | POA: Diagnosis not present

## 2020-02-16 DIAGNOSIS — M9702XA Periprosthetic fracture around internal prosthetic left hip joint, initial encounter: Secondary | ICD-10-CM | POA: Diagnosis not present

## 2020-02-16 DIAGNOSIS — Z87442 Personal history of urinary calculi: Secondary | ICD-10-CM | POA: Diagnosis not present

## 2020-02-16 DIAGNOSIS — R5381 Other malaise: Secondary | ICD-10-CM | POA: Diagnosis not present

## 2020-02-16 DIAGNOSIS — M069 Rheumatoid arthritis, unspecified: Secondary | ICD-10-CM | POA: Diagnosis not present

## 2020-02-16 DIAGNOSIS — Z96659 Presence of unspecified artificial knee joint: Secondary | ICD-10-CM | POA: Diagnosis not present

## 2020-02-16 DIAGNOSIS — Z743 Need for continuous supervision: Secondary | ICD-10-CM | POA: Diagnosis not present

## 2020-02-16 DIAGNOSIS — I10 Essential (primary) hypertension: Secondary | ICD-10-CM | POA: Diagnosis not present

## 2020-02-16 DIAGNOSIS — W19XXXA Unspecified fall, initial encounter: Secondary | ICD-10-CM

## 2020-02-16 DIAGNOSIS — R0902 Hypoxemia: Secondary | ICD-10-CM | POA: Diagnosis not present

## 2020-02-16 DIAGNOSIS — S72453A Displaced supracondylar fracture without intracondylar extension of lower end of unspecified femur, initial encounter for closed fracture: Secondary | ICD-10-CM | POA: Diagnosis not present

## 2020-02-16 DIAGNOSIS — T148XXA Other injury of unspecified body region, initial encounter: Secondary | ICD-10-CM | POA: Diagnosis not present

## 2020-02-16 DIAGNOSIS — R000 Tachycardia, unspecified: Secondary | ICD-10-CM | POA: Diagnosis not present

## 2020-02-16 DIAGNOSIS — R768 Other specified abnormal immunological findings in serum: Secondary | ICD-10-CM | POA: Diagnosis not present

## 2020-02-16 DIAGNOSIS — S8992XA Unspecified injury of left lower leg, initial encounter: Secondary | ICD-10-CM | POA: Diagnosis not present

## 2020-02-16 DIAGNOSIS — E119 Type 2 diabetes mellitus without complications: Secondary | ICD-10-CM | POA: Diagnosis not present

## 2020-02-16 DIAGNOSIS — R6889 Other general symptoms and signs: Secondary | ICD-10-CM | POA: Diagnosis not present

## 2020-02-16 DIAGNOSIS — M25462 Effusion, left knee: Secondary | ICD-10-CM | POA: Diagnosis not present

## 2020-02-16 DIAGNOSIS — K229 Disease of esophagus, unspecified: Secondary | ICD-10-CM | POA: Diagnosis not present

## 2020-02-16 DIAGNOSIS — S72452A Displaced supracondylar fracture without intracondylar extension of lower end of left femur, initial encounter for closed fracture: Secondary | ICD-10-CM | POA: Diagnosis not present

## 2020-02-16 DIAGNOSIS — S72492A Other fracture of lower end of left femur, initial encounter for closed fracture: Secondary | ICD-10-CM | POA: Diagnosis not present

## 2020-02-16 DIAGNOSIS — M978XXD Periprosthetic fracture around other internal prosthetic joint, subsequent encounter: Secondary | ICD-10-CM | POA: Diagnosis not present

## 2020-02-16 DIAGNOSIS — Y92002 Bathroom of unspecified non-institutional (private) residence single-family (private) house as the place of occurrence of the external cause: Secondary | ICD-10-CM | POA: Insufficient documentation

## 2020-02-16 DIAGNOSIS — M978XXA Periprosthetic fracture around other internal prosthetic joint, initial encounter: Secondary | ICD-10-CM

## 2020-02-16 DIAGNOSIS — M9712XA Periprosthetic fracture around internal prosthetic left knee joint, initial encounter: Secondary | ICD-10-CM | POA: Diagnosis not present

## 2020-02-16 DIAGNOSIS — K449 Diaphragmatic hernia without obstruction or gangrene: Secondary | ICD-10-CM | POA: Diagnosis not present

## 2020-02-16 DIAGNOSIS — Y9389 Activity, other specified: Secondary | ICD-10-CM | POA: Insufficient documentation

## 2020-02-16 DIAGNOSIS — Z96653 Presence of artificial knee joint, bilateral: Secondary | ICD-10-CM | POA: Diagnosis not present

## 2020-02-16 DIAGNOSIS — Z96642 Presence of left artificial hip joint: Secondary | ICD-10-CM | POA: Diagnosis not present

## 2020-02-16 DIAGNOSIS — D649 Anemia, unspecified: Secondary | ICD-10-CM | POA: Diagnosis not present

## 2020-02-16 DIAGNOSIS — K2270 Barrett's esophagus without dysplasia: Secondary | ICD-10-CM | POA: Diagnosis not present

## 2020-02-16 DIAGNOSIS — I110 Hypertensive heart disease with heart failure: Secondary | ICD-10-CM | POA: Diagnosis not present

## 2020-02-16 DIAGNOSIS — F32A Depression, unspecified: Secondary | ICD-10-CM | POA: Diagnosis not present

## 2020-02-16 DIAGNOSIS — G2581 Restless legs syndrome: Secondary | ICD-10-CM | POA: Diagnosis not present

## 2020-02-16 DIAGNOSIS — Z9181 History of falling: Secondary | ICD-10-CM | POA: Diagnosis not present

## 2020-02-16 DIAGNOSIS — M979XXA Periprosthetic fracture around unspecified internal prosthetic joint, initial encounter: Secondary | ICD-10-CM | POA: Diagnosis not present

## 2020-02-16 DIAGNOSIS — K219 Gastro-esophageal reflux disease without esophagitis: Secondary | ICD-10-CM | POA: Diagnosis not present

## 2020-02-16 DIAGNOSIS — Y33XXXA Other specified events, undetermined intent, initial encounter: Secondary | ICD-10-CM | POA: Diagnosis not present

## 2020-02-16 DIAGNOSIS — R531 Weakness: Secondary | ICD-10-CM | POA: Diagnosis not present

## 2020-02-16 DIAGNOSIS — M6281 Muscle weakness (generalized): Secondary | ICD-10-CM | POA: Diagnosis not present

## 2020-02-16 DIAGNOSIS — I447 Left bundle-branch block, unspecified: Secondary | ICD-10-CM | POA: Diagnosis not present

## 2020-02-16 DIAGNOSIS — G43909 Migraine, unspecified, not intractable, without status migrainosus: Secondary | ICD-10-CM | POA: Diagnosis not present

## 2020-02-16 DIAGNOSIS — G2401 Drug induced subacute dyskinesia: Secondary | ICD-10-CM | POA: Diagnosis not present

## 2020-02-16 DIAGNOSIS — G8911 Acute pain due to trauma: Secondary | ICD-10-CM | POA: Diagnosis not present

## 2020-02-16 DIAGNOSIS — Z7401 Bed confinement status: Secondary | ICD-10-CM | POA: Diagnosis not present

## 2020-02-16 DIAGNOSIS — D6489 Other specified anemias: Secondary | ICD-10-CM | POA: Diagnosis not present

## 2020-02-16 DIAGNOSIS — Z96652 Presence of left artificial knee joint: Secondary | ICD-10-CM | POA: Diagnosis not present

## 2020-02-16 DIAGNOSIS — J449 Chronic obstructive pulmonary disease, unspecified: Secondary | ICD-10-CM | POA: Diagnosis not present

## 2020-02-16 HISTORY — PX: OTHER SURGICAL HISTORY: SHX169

## 2020-02-16 LAB — BASIC METABOLIC PANEL
Anion gap: 12 (ref 5–15)
BUN: 12 mg/dL (ref 8–23)
CO2: 30 mmol/L (ref 22–32)
Calcium: 8.3 mg/dL — ABNORMAL LOW (ref 8.9–10.3)
Chloride: 95 mmol/L — ABNORMAL LOW (ref 98–111)
Creatinine, Ser: 1.07 mg/dL — ABNORMAL HIGH (ref 0.44–1.00)
GFR, Estimated: 58 mL/min — ABNORMAL LOW (ref >60)
Glucose, Bld: 162 mg/dL — ABNORMAL HIGH (ref 70–99)
Potassium: 2.9 mmol/L — ABNORMAL LOW (ref 3.5–5.1)
Sodium: 137 mmol/L (ref 135–145)

## 2020-02-16 LAB — CBC
HCT: 32.9 % — ABNORMAL LOW (ref 36.0–46.0)
Hemoglobin: 10.3 g/dL — ABNORMAL LOW (ref 12.0–15.0)
MCH: 25.6 pg — ABNORMAL LOW (ref 26.0–34.0)
MCHC: 31.3 g/dL (ref 30.0–36.0)
MCV: 81.8 fL (ref 80.0–100.0)
Platelets: 191 10*3/uL (ref 150–400)
RBC: 4.02 MIL/uL (ref 3.87–5.11)
RDW: 15.7 % — ABNORMAL HIGH (ref 11.5–15.5)
WBC: 6.4 10*3/uL (ref 4.0–10.5)
nRBC: 0 % (ref 0.0–0.2)

## 2020-02-16 LAB — RESP PANEL BY RT-PCR (FLU A&B, COVID) ARPGX2
Influenza A by PCR: NEGATIVE
Influenza B by PCR: NEGATIVE
SARS Coronavirus 2 by RT PCR: NEGATIVE

## 2020-02-16 MED ORDER — ACETAMINOPHEN 500 MG PO TABS
1000.0000 mg | ORAL_TABLET | Freq: Once | ORAL | Status: AC
  Administered 2020-02-16: 1000 mg via ORAL
  Filled 2020-02-16: qty 2

## 2020-02-16 MED ORDER — FENTANYL CITRATE (PF) 100 MCG/2ML IJ SOLN
50.0000 ug | INTRAMUSCULAR | Status: DC | PRN
  Administered 2020-02-16: 50 ug via INTRAVENOUS
  Filled 2020-02-16: qty 2

## 2020-02-16 MED ORDER — OXYCODONE HCL 5 MG PO TABS
5.0000 mg | ORAL_TABLET | Freq: Once | ORAL | Status: AC
  Administered 2020-02-16: 5 mg via ORAL
  Filled 2020-02-16: qty 1

## 2020-02-16 MED ORDER — POTASSIUM CHLORIDE CRYS ER 20 MEQ PO TBCR
40.0000 meq | EXTENDED_RELEASE_TABLET | Freq: Once | ORAL | Status: AC
  Administered 2020-02-16: 40 meq via ORAL
  Filled 2020-02-16: qty 2

## 2020-02-16 NOTE — ED Notes (Signed)
[facility]S here for transport to [place]. Pt in NAD at this time.

## 2020-02-16 NOTE — ED Notes (Signed)
EMTALA reviewed by this RN.  

## 2020-02-16 NOTE — ED Triage Notes (Signed)
Pt arrives [facility] from home w cc of fall. Pt slid when walking out of bathroom. States l knee popped out. Hx bilateral knee surgeries, had to have L knee stabilized. Pt 9/10 pain. Denies hitting head or LOC. Denies nhip painh

## 2020-02-16 NOTE — ED Provider Notes (Signed)
[facility] Emergency Department Provider Note  ____________________________________________  Time seen: Approximately 1:29 AM  I have reviewed the triage vital signs and the nursing notes.   HISTORY  Chief Complaint Fall   HPI SHAKERRA Murphy is a 64 y.o. female with a history of bilateral knee replacements who presents for evaluation of left knee pain.  Patient reports that she was coming out of the bathroom when she slipped and felt her knee popping out.  She was unable to stand up and called 911.  She denies head trauma or LOC.  She is not on blood thinners.  She denies neck pain or back pain.  She is complaining of 9 out of 10 sharp constant pain of the left knee that started after this episode just prior to arrival.   Past Medical History:  Diagnosis Date  . Acid reflux   . Anxiety   . Arrhythmia    treated with meds and has no current problems  . Arthritis    most uncomfortable in knees  . Asthma    uses several inhalers  . Depression   . Fever blister   . Hematuria   . History of kidney stones   . Hypertension   . Hypoglycemia   . Left flank pain   . Migraine   . Restless leg   . Yeast vaginitis     Patient Active Problem List   Diagnosis Date Noted  . 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 Seasonal allergic rhinitis due to pollen 09/20/2019  . Callus of foot 08/13/2019  . Candidal vaginitis 08/13/2019  . Other symptoms and signs involving the nervous system 08/13/2019  . Encounter for general adult medical examination with abnormal findings 08/13/2019  . Restless leg syndrome 04/30/2019  . Type 2 diabetes mellitus with hyperglycemia ([place]) 04/02/2019  . Muscle cramps 04/02/2019  . Diastolic dysfunction 96/78/9381  . SOB (shortness of breath) 12/14/2018  . Hiatal hernia with GERD 12/14/2018  . Wheezing 12/14/2018  . Impaired fasting glucose 12/14/2018  . COPD with acute exacerbation ([place]) 09/28/2018  . Acute upper  respiratory infection 08/25/2018  . Recurrent sinusitis 08/15/2018  . Prediabetes 08/15/2018  . Nonintractable headache 08/15/2018  . Dysuria 08/15/2018  . Intractable migraine without status migrainosus 04/22/2018  . Nausea 04/22/2018  . Polyneuropathy associated with underlying disease ([place]) 04/22/2018  . Midline low back pain without sciatica 04/22/2018  . Routine cervical smear 04/22/2018  . Essential hypertension 07/01/2017  . Acute bronchitis with asthma 07/01/2017  . Allergic rhinitis 07/01/2017  . Moderate asthma without complication 01/75/1025  . Severe recurrent major depression without psychotic features ([place]) 09/10/2014  . COPD (chronic obstructive pulmonary disease) ([place]) 09/10/2014  . Calculi, ureter 04/26/2013  . Corneal graft malfunction 08/17/2012  . Calculus of kidney 04/26/2012  . Renal colic 85/27/7824  . Urge incontinence 04/26/2012  . Diaphragmatic hernia 10/27/2010  . Barrett esophagus 07/07/2010    Past Surgical History:  Procedure Laterality Date  . ABDOMINAL HYSTERECTOMY    . AMPUTATION TOE Left 07/31/2016   Procedure: AMPUTATION TOE/MPJ 2nd toe;  Surgeon: Sharlotte Alamo, DPM;  Location: [facility] ORS;  Service: Podiatry;  Laterality: Left;  . APPENDECTOMY  1990  . BACK SURGERY     low back  . BREAST SURGERY     bilateral breast reduction  . CARDIAC ELECTROPHYSIOLOGY STUDY AND ABLATION    . CHOLECYSTECTOMY  1990  . COLONOSCOPY WITH PROPOFOL N/A 01/04/2017   Procedure: COLONOSCOPY WITH PROPOFOL;  Surgeon: Manya Silvas, MD;  Location: [facility] ENDOSCOPY;  Service: Endoscopy;  Laterality: N/A;  . CORNEAL TRANSPLANT    . ESOPHAGOGASTRODUODENOSCOPY (EGD) WITH PROPOFOL N/A 01/04/2017   Procedure: ESOPHAGOGASTRODUODENOSCOPY (EGD) WITH PROPOFOL;  Surgeon: Manya Silvas, MD;  Location[facility] ENDOSCOPY;  Service: Endoscopy;  Laterality: N/A;  . EXCISION BONE CYST Left 07/31/2016   Procedure: EXCISION BONE CYST/exostectomy 28124/left 2nd;  Surgeon: Sharlotte Alamo,  DPM;  Location: [facility] ORS;  Service: Podiatry;  Laterality: Left;  . EXTRACORPOREAL SHOCK WAVE LITHOTRIPSY Left 09/12/2015   Procedure: EXTRACORPOREAL SHOCK WAVE LITHOTRIPSY (ESWL);  Surgeon: Hollice Espy, MD;  Location: [facility] ORS;  Service: Urology;  Laterality: Left;  . FRACTURE SURGERY     left foot  . HH repair    . JOINT REPLACEMENT Bilateral 2013,2014   total knees  . LAPAROSCOPIC HYSTERECTOMY    . LITHOTRIPSY    . TONSILLECTOMY    . TOTAL KNEE REVISION Right 11/14/2019   Procedure: Revision patella and tibial polyethylene;  Surgeon: Hessie Knows, MD;  Location: [facility] ORS;  Service: Orthopedics;  Laterality: Right;  . URETEROSCOPY      Prior to Admission medications   Medication Sig Start Date End Date Taking? Authorizing Provider  BIOTIN PO Take 1 tablet by mouth daily.     [provider]  Brexpiprazole (REXULTI) 4 MG TABS Take 4 mg by mouth daily.     [provider]  busPIRone (BUSPAR) 10 MG tablet Take 10 mg by mouth 2 (two) times daily.  07/13/18   [provider]  cetirizine (ZYRTEC) 10 MG tablet Take 1 tablet (10 mg total) by mouth daily. 06/01/19   Kendell Bane, NP  diclofenac sodium (VOLTAREN) 1 % GEL Apply 2 g topically in the morning, at noon, and at bedtime.     [provider]  diltiazem (DILACOR XR) 180 MG 24 hr capsule Take 1 capsule (180 mg total) by mouth daily. 10/23/19   Ronnell Freshwater, NP  diphenhydrAMINE (BENADRYL) 25 MG tablet Take 50 mg by mouth 2 (two) times daily as needed.     [provider]  diphenoxylate-atropine (LOMOTIL) 2.5-0.025 MG tablet Take 1 tablet by mouth 3 (three) times daily as needed for diarrhea or loose stools. 05/01/19   Ronnell Freshwater, NP  docusate sodium (COLACE) 100 MG capsule Take 1 capsule (100 mg total) by mouth 2 (two) times daily. 11/15/19   Duanne Guess, PA-C  DULoxetine (CYMBALTA) 60 MG capsule Take 60 mg by mouth daily.    [provider]  enoxaparin (LOVENOX) 40  MG/0.4ML injection Inject 0.4 mLs (40 mg total) into the skin daily for 14 days. 11/15/19 11/29/19  Duanne Guess, PA-C  fluticasone furoate-vilanterol (BREO ELLIPTA) 200-25 MCG/INH AEPB Inhale 1 puff into the lungs daily. 11/29/19   Luiz Ochoa, NP  gabapentin (NEURONTIN) 600 MG tablet Take 1 tablet (600 mg total) by mouth 2 (two) times daily. 11/22/19   Ronnell Freshwater, NP  Ipratropium-Albuterol (COMBIVENT RESPIMAT) 20-100 MCG/ACT AERS respimat INHALE 1 PUFF INTO THE LUNGS EVERY 6 HOURS 11/07/19   Lavera Guise, MD  ipratropium-albuterol (DUONEB) 0.5-2.5 (3) MG/3ML SOLN Take 3 mLs by nebulization every 4 (four) hours as needed (for shortness of breath/wheezing). 01/25/19   Kendell Bane, NP  ketoconazole (NIZORAL) 2 % cream Apply 1 application topically daily. Patient taking differently: Apply 1 application topically daily as needed for irritation.  08/08/19   Ronnell Freshwater, NP  LORazepam (ATIVAN) 1 MG tablet  Take 1 mg by mouth at bedtime as needed for sleep.     [provider]  metoprolol tartrate (LOPRESSOR) 25 MG tablet Take 1 tablet (25 mg total) by mouth 2 (two) times daily. 11/17/19   Duanne Guess, PA-C  montelukast (SINGULAIR) 10 MG tablet TAKE 1 TABLET(10 MG) BY MOUTH AT BEDTIME Patient taking differently: Take 10 mg by mouth at bedtime.  07/30/19   Lavera Guise, MD  ondansetron (ZOFRAN) 4 MG tablet Take 1 tablet (4 mg total) by mouth every 6 (six) hours as needed for nausea. 11/15/19   Duanne Guess, PA-C  oxybutynin (DITROPAN) 5 MG tablet Take 5 mg by mouth in the morning and at bedtime. 05/08/19   [provider]  oxyCODONE (OXY IR/ROXICODONE) 5 MG immediate release tablet Take 1-2 tablets (5-10 mg total) by mouth every 4 (four) hours as needed for moderate pain or severe pain. 11/16/19   Duanne Guess, PA-C  rOPINIRole (REQUIP) 0.5 MG tablet Take two tablets po BID for restless legs. Patient taking differently: Take 1 mg by mouth in the morning and at  bedtime.  10/23/19   Ronnell Freshwater, NP  SUMAtriptan (IMITREX) 100 MG tablet Take 1 tablet (100 mg total) by mouth every 2 (two) hours as needed (for migraine headaches.). May repeat in 1 hours if headache persists or recurs. 11/02/19   Ronnell Freshwater, NP  Tiotropium Bromide Monohydrate (SPIRIVA RESPIMAT) 1.25 MCG/ACT AERS Inhale 2 puffs into the lungs daily. 11/22/19   Ronnell Freshwater, NP  traMADol (ULTRAM) 50 MG tablet Take 50 mg by mouth every 6 (six) hours as needed for moderate pain.    [provider]  traZODone (DESYREL) 150 MG tablet Take 150-300 mg by mouth at bedtime.     [provider]  valACYclovir (VALTREX) 500 MG tablet Take 500 mg by mouth daily as needed (for fever blisters).    [provider]    Allergies Librium [chlordiazepoxide], Metoclopramide, Vanilla, Aspirin, Nsaids, Azithromycin, Buprenorphine hcl, Chlordiazepoxide hcl, Morphine, Sulfa antibiotics, Tolmetin, Amlodipine besylate, and Iron  Family History  Problem Relation Age of Onset  . Prostate cancer Father   . Kidney Stones Father   . Kidney disease Neg Hx     Social History Social History   Tobacco Use  . Smoking status: Never Smoker  . Smokeless tobacco: Never Used  Vaping Use  . Vaping Use: Never used  Substance Use Topics  . Alcohol use: No  . Drug use: No    Review of Systems  Constitutional: Negative for fever. Eyes: Negative for visual changes. ENT: Negative for facial injury or neck injury Cardiovascular: Negative for chest injury. Respiratory: Negative for shortness of breath. Negative for chest wall injury. Gastrointestinal: Negative for abdominal pain or injury. Genitourinary: Negative for dysuria. Musculoskeletal: Negative for back injury, + L knee pain Skin: Negative for laceration/abrasions. Neurological: Negative for head injury.   ____________________________________________   PHYSICAL EXAM:  VITAL SIGNS: ED Triage Vitals  Enc Vitals Group      BP 02/16/20 0121 113/68     Pulse Rate 02/16/20 0121 94     Resp 02/16/20 0121 18     Temp 02/16/20 0121 99 F (37.2 C)     Temp Source 02/16/20 0121 Oral     SpO2 02/16/20 0121 96 %     Weight 02/16/20 0122 172 lb (78 kg)     Height 02/16/20 0122 5\' 3"  (1.6 m)     Head Circumference --  Peak Flow --      Pain Score 02/16/20 0122 9     Pain Loc --      Pain Edu? --      Excl. in [place]? --     Full spinal precautions maintained throughout the trauma exam. Constitutional: Alert and oriented. No acute distress. Does not appear intoxicated. HEENT Head: Normocephalic and atraumatic. Face: No facial bony tenderness. Stable midface Ears: No hemotympanum bilaterally. No Battle sign Eyes: No eye injury. PERRL. No raccoon eyes Nose: Nontender. No epistaxis. No rhinorrhea Mouth/Throat: Mucous membranes are moist. No oropharyngeal blood. No dental injury. Airway patent without stridor. Normal voice. Neck: no C-collar. No midline c-spine tenderness.  Cardiovascular: Normal rate, regular rhythm. Normal and symmetric distal pulses are present in all extremities. Pulmonary/Chest: Chest wall is stable and nontender to palpation/compression. Normal respiratory effort. Breath sounds are normal. No crepitus.  Abdominal: Soft, nontender, non distended. Musculoskeletal: Left leg slightly bent at the knee with diffuse tenderness to palpation. Nontender with normal full range of motion in all other extremities. No deformities or lacerations. No thoracic or lumbar midline spinal tenderness. Pelvis is stable. Skin: Skin is warm, dry and intact. No abrasions or contutions. Psychiatric: Speech and behavior are appropriate. Neurological: Normal speech and language. Moves all extremities to command. No gross focal neurologic deficits are appreciated.  Glascow Coma Score: 4 - Opens eyes on own 6 - Follows simple motor commands 5 - Alert and oriented GCS:  15   ____________________________________________   LABS (all labs ordered are listed, but only abnormal results are displayed)  Labs Reviewed  CBC - Abnormal; Notable for the following components:      Result Value   Hemoglobin 10.3 (*)    HCT 32.9 (*)    MCH 25.6 (*)    RDW 15.7 (*)    All other components within normal limits  BASIC METABOLIC PANEL - Abnormal; Notable for the following components:   Potassium 2.9 (*)    Chloride 95 (*)    Glucose, Bld 162 (*)    Creatinine, Ser 1.07 (*)    Calcium 8.3 (*)    GFR, Estimated 58 (*)    All other components within normal limits  RESP PANEL BY RT-PCR (FLU A&B, COVID) [id number]   ____________________________________________  EKG  none  ____________________________________________  RADIOLOGY  I have personally reviewed the images performed during this visit and I agree with the Radiologist's read.   Interpretation by Radiologist:  DG Knee 1-2 Views Left  Result Date: 02/16/2020 CLINICAL DATA:  Initial evaluation for acute trauma, fall. EXAM: LEFT KNEE - 1-2 VIEW COMPARISON:  Prior radiograph from 10/26/2013. FINDINGS: Cemented left total knee arthroplasty in place. There is an acute comminuted periprosthetic fracture involving the distal left femur with impaction. Up to 1.3 cm of posterior displacement. Tibial and patellar components remain grossly intact. Diffuse soft tissue swelling seen about the knee. Associated joint effusion. IMPRESSION: Acute comminuted periprosthetic fracture involving the distal left femur. Electronically Signed   By: Jeannine Boga M.D.   On: 02/16/2020 01:43      ____________________________________________   PROCEDURES  Procedure(s) performed: None Procedures Critical Care performed:  None ____________________________________________   INITIAL IMPRESSION / ASSESSMENT AND PLAN / ED COURSE  64 y.o. female with a history of bilateral knee replacements who presents for evaluation of  left knee pain.  X-ray visualized by me showing periprostatic fracture of the left femur, confirmed by radiology.  Discussed with Dr. Roland Rack who feels patient warrants transfer  to tertiary care for surgical repair. Will contact [facility]. Basic preop labs ordered. IV morphine for pain.   _________________________ 4:58 AM on 02/16/2020 -----------------------------------------  Several attempts to reach orthopedic surgery at [facility] unsuccessful.  Therefore patient was accepted at [facility] by Dr. Arnold Cellar.  Serial exams with no signs of compartment syndrome.  Will put patient on a knee immobilizer to facilitate transport.  Labs for medical clearance with mild hypokalemia which was supplemented orally.       ____________________________________________  Please note:  Patient was evaluated in Emergency Department today for the symptoms described in the history of present illness. Patient was evaluated in the context of the global COVID-19 pandemic, which necessitated consideration that the patient might be at risk for infection with the SARS-CoV-2 virus that causes COVID-19. Institutional protocols and algorithms that pertain to the evaluation of patients at risk for COVID-19 are in a state of rapid change based on information released by regulatory bodies including the CDC and federal and state organizations. These policies and algorithms were followed during the patient's care in the ED.  Some ED evaluations and interventions may be delayed as a result of limited staffing during the pandemic.   ____________________________________________   FINAL CLINICAL IMPRESSION(S) / ED DIAGNOSES   Final diagnoses:  Periprosthetic fracture of shaft of femur  Fall, initial encounter      NEW MEDICATIONS STARTED DURING THIS VISIT:  ED Discharge Orders    None       Note:  This document was prepared using Dragon voice recognition software and may include unintentional dictation errors.    Alfred Levins,  [place], MD 02/16/20 0500

## 2020-02-17 DIAGNOSIS — M9712XA Periprosthetic fracture around internal prosthetic left knee joint, initial encounter: Secondary | ICD-10-CM | POA: Diagnosis not present

## 2020-02-17 DIAGNOSIS — S72452A Displaced supracondylar fracture without intracondylar extension of lower end of left femur, initial encounter for closed fracture: Secondary | ICD-10-CM | POA: Diagnosis not present

## 2020-02-18 DIAGNOSIS — Z96659 Presence of unspecified artificial knee joint: Secondary | ICD-10-CM | POA: Insufficient documentation

## 2020-02-19 DIAGNOSIS — R7689 Other specified abnormal immunological findings in serum: Secondary | ICD-10-CM | POA: Insufficient documentation

## 2020-02-22 DIAGNOSIS — M978XXD Periprosthetic fracture around other internal prosthetic joint, subsequent encounter: Secondary | ICD-10-CM | POA: Diagnosis not present

## 2020-02-22 DIAGNOSIS — Z96659 Presence of unspecified artificial knee joint: Secondary | ICD-10-CM | POA: Diagnosis not present

## 2020-02-22 DIAGNOSIS — G2581 Restless legs syndrome: Secondary | ICD-10-CM | POA: Diagnosis not present

## 2020-02-22 DIAGNOSIS — J449 Chronic obstructive pulmonary disease, unspecified: Secondary | ICD-10-CM | POA: Diagnosis not present

## 2020-02-22 DIAGNOSIS — M6281 Muscle weakness (generalized): Secondary | ICD-10-CM | POA: Diagnosis not present

## 2020-02-22 DIAGNOSIS — R531 Weakness: Secondary | ICD-10-CM | POA: Diagnosis not present

## 2020-02-22 DIAGNOSIS — K2270 Barrett's esophagus without dysplasia: Secondary | ICD-10-CM | POA: Diagnosis not present

## 2020-02-22 DIAGNOSIS — M978XXA Periprosthetic fracture around other internal prosthetic joint, initial encounter: Secondary | ICD-10-CM | POA: Diagnosis not present

## 2020-02-22 DIAGNOSIS — I10 Essential (primary) hypertension: Secondary | ICD-10-CM | POA: Diagnosis not present

## 2020-02-22 DIAGNOSIS — R000 Tachycardia, unspecified: Secondary | ICD-10-CM | POA: Diagnosis not present

## 2020-02-22 DIAGNOSIS — M84459A Pathological fracture, hip, unspecified, initial encounter for fracture: Secondary | ICD-10-CM | POA: Diagnosis not present

## 2020-02-22 DIAGNOSIS — G8911 Acute pain due to trauma: Secondary | ICD-10-CM | POA: Diagnosis not present

## 2020-02-22 DIAGNOSIS — Z743 Need for continuous supervision: Secondary | ICD-10-CM | POA: Diagnosis not present

## 2020-02-23 DIAGNOSIS — Z96659 Presence of unspecified artificial knee joint: Secondary | ICD-10-CM | POA: Diagnosis not present

## 2020-02-23 DIAGNOSIS — I10 Essential (primary) hypertension: Secondary | ICD-10-CM | POA: Diagnosis not present

## 2020-02-23 DIAGNOSIS — M978XXD Periprosthetic fracture around other internal prosthetic joint, subsequent encounter: Secondary | ICD-10-CM | POA: Diagnosis not present

## 2020-02-23 DIAGNOSIS — J449 Chronic obstructive pulmonary disease, unspecified: Secondary | ICD-10-CM | POA: Diagnosis not present

## 2020-02-23 DIAGNOSIS — R000 Tachycardia, unspecified: Secondary | ICD-10-CM | POA: Diagnosis not present

## 2020-02-25 DIAGNOSIS — G8911 Acute pain due to trauma: Secondary | ICD-10-CM | POA: Diagnosis not present

## 2020-02-25 DIAGNOSIS — J449 Chronic obstructive pulmonary disease, unspecified: Secondary | ICD-10-CM | POA: Diagnosis not present

## 2020-02-25 DIAGNOSIS — M84459A Pathological fracture, hip, unspecified, initial encounter for fracture: Secondary | ICD-10-CM | POA: Diagnosis not present

## 2020-02-25 DIAGNOSIS — G2581 Restless legs syndrome: Secondary | ICD-10-CM | POA: Diagnosis not present

## 2020-03-05 DIAGNOSIS — Z96659 Presence of unspecified artificial knee joint: Secondary | ICD-10-CM | POA: Diagnosis not present

## 2020-03-05 DIAGNOSIS — M978XXA Periprosthetic fracture around other internal prosthetic joint, initial encounter: Secondary | ICD-10-CM | POA: Diagnosis not present

## 2020-03-13 DIAGNOSIS — M84459A Pathological fracture, hip, unspecified, initial encounter for fracture: Secondary | ICD-10-CM | POA: Diagnosis not present

## 2020-03-13 DIAGNOSIS — G2581 Restless legs syndrome: Secondary | ICD-10-CM | POA: Diagnosis not present

## 2020-03-13 DIAGNOSIS — J449 Chronic obstructive pulmonary disease, unspecified: Secondary | ICD-10-CM | POA: Diagnosis not present

## 2020-03-13 DIAGNOSIS — G8911 Acute pain due to trauma: Secondary | ICD-10-CM | POA: Diagnosis not present

## 2020-03-19 DIAGNOSIS — E785 Hyperlipidemia, unspecified: Secondary | ICD-10-CM | POA: Diagnosis not present

## 2020-03-19 DIAGNOSIS — G4700 Insomnia, unspecified: Secondary | ICD-10-CM | POA: Diagnosis not present

## 2020-03-19 DIAGNOSIS — I10 Essential (primary) hypertension: Secondary | ICD-10-CM | POA: Diagnosis not present

## 2020-03-19 DIAGNOSIS — K2270 Barrett's esophagus without dysplasia: Secondary | ICD-10-CM | POA: Diagnosis not present

## 2020-03-19 DIAGNOSIS — Z9181 History of falling: Secondary | ICD-10-CM | POA: Diagnosis not present

## 2020-03-19 DIAGNOSIS — J449 Chronic obstructive pulmonary disease, unspecified: Secondary | ICD-10-CM | POA: Diagnosis not present

## 2020-03-19 DIAGNOSIS — G2581 Restless legs syndrome: Secondary | ICD-10-CM | POA: Diagnosis not present

## 2020-03-19 DIAGNOSIS — K5909 Other constipation: Secondary | ICD-10-CM | POA: Diagnosis not present

## 2020-03-19 DIAGNOSIS — N200 Calculus of kidney: Secondary | ICD-10-CM | POA: Diagnosis not present

## 2020-03-19 DIAGNOSIS — M9702XD Periprosthetic fracture around internal prosthetic left hip joint, subsequent encounter: Secondary | ICD-10-CM | POA: Diagnosis not present

## 2020-03-19 DIAGNOSIS — E1165 Type 2 diabetes mellitus with hyperglycemia: Secondary | ICD-10-CM | POA: Diagnosis not present

## 2020-03-19 DIAGNOSIS — Z7951 Long term (current) use of inhaled steroids: Secondary | ICD-10-CM | POA: Diagnosis not present

## 2020-03-19 DIAGNOSIS — G43919 Migraine, unspecified, intractable, without status migrainosus: Secondary | ICD-10-CM | POA: Diagnosis not present

## 2020-03-19 DIAGNOSIS — E1142 Type 2 diabetes mellitus with diabetic polyneuropathy: Secondary | ICD-10-CM | POA: Diagnosis not present

## 2020-03-19 DIAGNOSIS — M069 Rheumatoid arthritis, unspecified: Secondary | ICD-10-CM | POA: Diagnosis not present

## 2020-03-19 DIAGNOSIS — M1990 Unspecified osteoarthritis, unspecified site: Secondary | ICD-10-CM | POA: Diagnosis not present

## 2020-03-19 DIAGNOSIS — M80052D Age-related osteoporosis with current pathological fracture, left femur, subsequent encounter for fracture with routine healing: Secondary | ICD-10-CM | POA: Diagnosis not present

## 2020-03-19 DIAGNOSIS — Z96653 Presence of artificial knee joint, bilateral: Secondary | ICD-10-CM | POA: Diagnosis not present

## 2020-03-20 DIAGNOSIS — E1165 Type 2 diabetes mellitus with hyperglycemia: Secondary | ICD-10-CM | POA: Diagnosis not present

## 2020-03-20 DIAGNOSIS — N200 Calculus of kidney: Secondary | ICD-10-CM | POA: Diagnosis not present

## 2020-03-20 DIAGNOSIS — G4700 Insomnia, unspecified: Secondary | ICD-10-CM | POA: Diagnosis not present

## 2020-03-20 DIAGNOSIS — Z96653 Presence of artificial knee joint, bilateral: Secondary | ICD-10-CM | POA: Diagnosis not present

## 2020-03-20 DIAGNOSIS — G43919 Migraine, unspecified, intractable, without status migrainosus: Secondary | ICD-10-CM | POA: Diagnosis not present

## 2020-03-20 DIAGNOSIS — M80052D Age-related osteoporosis with current pathological fracture, left femur, subsequent encounter for fracture with routine healing: Secondary | ICD-10-CM | POA: Diagnosis not present

## 2020-03-20 DIAGNOSIS — M069 Rheumatoid arthritis, unspecified: Secondary | ICD-10-CM | POA: Diagnosis not present

## 2020-03-20 DIAGNOSIS — K2270 Barrett's esophagus without dysplasia: Secondary | ICD-10-CM | POA: Diagnosis not present

## 2020-03-20 DIAGNOSIS — Z7951 Long term (current) use of inhaled steroids: Secondary | ICD-10-CM | POA: Diagnosis not present

## 2020-03-20 DIAGNOSIS — M1990 Unspecified osteoarthritis, unspecified site: Secondary | ICD-10-CM | POA: Diagnosis not present

## 2020-03-20 DIAGNOSIS — E785 Hyperlipidemia, unspecified: Secondary | ICD-10-CM | POA: Diagnosis not present

## 2020-03-20 DIAGNOSIS — M9702XD Periprosthetic fracture around internal prosthetic left hip joint, subsequent encounter: Secondary | ICD-10-CM | POA: Diagnosis not present

## 2020-03-20 DIAGNOSIS — J449 Chronic obstructive pulmonary disease, unspecified: Secondary | ICD-10-CM | POA: Diagnosis not present

## 2020-03-20 DIAGNOSIS — K5909 Other constipation: Secondary | ICD-10-CM | POA: Diagnosis not present

## 2020-03-20 DIAGNOSIS — I10 Essential (primary) hypertension: Secondary | ICD-10-CM | POA: Diagnosis not present

## 2020-03-20 DIAGNOSIS — G2581 Restless legs syndrome: Secondary | ICD-10-CM | POA: Diagnosis not present

## 2020-03-20 DIAGNOSIS — Z9181 History of falling: Secondary | ICD-10-CM | POA: Diagnosis not present

## 2020-03-20 DIAGNOSIS — E1142 Type 2 diabetes mellitus with diabetic polyneuropathy: Secondary | ICD-10-CM | POA: Diagnosis not present

## 2020-03-21 ENCOUNTER — Telehealth: Payer: Self-pay

## 2020-03-21 NOTE — Telephone Encounter (Signed)
Judeth Cornfield from [facility] called [phone]) on 03/20/20 asking for verbal orders for Occ Therapy for 1 x a week for 5 weeks and I gave the ok to do.

## 2020-03-25 DIAGNOSIS — G43919 Migraine, unspecified, intractable, without status migrainosus: Secondary | ICD-10-CM | POA: Diagnosis not present

## 2020-03-25 DIAGNOSIS — M069 Rheumatoid arthritis, unspecified: Secondary | ICD-10-CM | POA: Diagnosis not present

## 2020-03-25 DIAGNOSIS — E785 Hyperlipidemia, unspecified: Secondary | ICD-10-CM | POA: Diagnosis not present

## 2020-03-25 DIAGNOSIS — E1165 Type 2 diabetes mellitus with hyperglycemia: Secondary | ICD-10-CM | POA: Diagnosis not present

## 2020-03-25 DIAGNOSIS — I10 Essential (primary) hypertension: Secondary | ICD-10-CM | POA: Diagnosis not present

## 2020-03-25 DIAGNOSIS — K2270 Barrett's esophagus without dysplasia: Secondary | ICD-10-CM | POA: Diagnosis not present

## 2020-03-25 DIAGNOSIS — J449 Chronic obstructive pulmonary disease, unspecified: Secondary | ICD-10-CM | POA: Diagnosis not present

## 2020-03-25 DIAGNOSIS — N200 Calculus of kidney: Secondary | ICD-10-CM | POA: Diagnosis not present

## 2020-03-25 DIAGNOSIS — M80052D Age-related osteoporosis with current pathological fracture, left femur, subsequent encounter for fracture with routine healing: Secondary | ICD-10-CM | POA: Diagnosis not present

## 2020-03-25 DIAGNOSIS — M9702XD Periprosthetic fracture around internal prosthetic left hip joint, subsequent encounter: Secondary | ICD-10-CM | POA: Diagnosis not present

## 2020-03-25 DIAGNOSIS — M1990 Unspecified osteoarthritis, unspecified site: Secondary | ICD-10-CM | POA: Diagnosis not present

## 2020-03-25 DIAGNOSIS — Z96653 Presence of artificial knee joint, bilateral: Secondary | ICD-10-CM | POA: Diagnosis not present

## 2020-03-25 DIAGNOSIS — G2581 Restless legs syndrome: Secondary | ICD-10-CM | POA: Diagnosis not present

## 2020-03-25 DIAGNOSIS — E1142 Type 2 diabetes mellitus with diabetic polyneuropathy: Secondary | ICD-10-CM | POA: Diagnosis not present

## 2020-03-25 DIAGNOSIS — Z7951 Long term (current) use of inhaled steroids: Secondary | ICD-10-CM | POA: Diagnosis not present

## 2020-03-25 DIAGNOSIS — K5909 Other constipation: Secondary | ICD-10-CM | POA: Diagnosis not present

## 2020-03-25 DIAGNOSIS — Z9181 History of falling: Secondary | ICD-10-CM | POA: Diagnosis not present

## 2020-03-25 DIAGNOSIS — G4700 Insomnia, unspecified: Secondary | ICD-10-CM | POA: Diagnosis not present

## 2020-03-26 DIAGNOSIS — I10 Essential (primary) hypertension: Secondary | ICD-10-CM | POA: Diagnosis not present

## 2020-03-26 DIAGNOSIS — M1990 Unspecified osteoarthritis, unspecified site: Secondary | ICD-10-CM | POA: Diagnosis not present

## 2020-03-26 DIAGNOSIS — Z96653 Presence of artificial knee joint, bilateral: Secondary | ICD-10-CM | POA: Diagnosis not present

## 2020-03-26 DIAGNOSIS — K5909 Other constipation: Secondary | ICD-10-CM | POA: Diagnosis not present

## 2020-03-26 DIAGNOSIS — M9702XD Periprosthetic fracture around internal prosthetic left hip joint, subsequent encounter: Secondary | ICD-10-CM | POA: Diagnosis not present

## 2020-03-26 DIAGNOSIS — E785 Hyperlipidemia, unspecified: Secondary | ICD-10-CM | POA: Diagnosis not present

## 2020-03-26 DIAGNOSIS — Z7951 Long term (current) use of inhaled steroids: Secondary | ICD-10-CM | POA: Diagnosis not present

## 2020-03-26 DIAGNOSIS — M069 Rheumatoid arthritis, unspecified: Secondary | ICD-10-CM | POA: Diagnosis not present

## 2020-03-26 DIAGNOSIS — N200 Calculus of kidney: Secondary | ICD-10-CM | POA: Diagnosis not present

## 2020-03-26 DIAGNOSIS — K2270 Barrett's esophagus without dysplasia: Secondary | ICD-10-CM | POA: Diagnosis not present

## 2020-03-26 DIAGNOSIS — J449 Chronic obstructive pulmonary disease, unspecified: Secondary | ICD-10-CM | POA: Diagnosis not present

## 2020-03-26 DIAGNOSIS — G4700 Insomnia, unspecified: Secondary | ICD-10-CM | POA: Diagnosis not present

## 2020-03-26 DIAGNOSIS — M80052D Age-related osteoporosis with current pathological fracture, left femur, subsequent encounter for fracture with routine healing: Secondary | ICD-10-CM | POA: Diagnosis not present

## 2020-03-26 DIAGNOSIS — E1142 Type 2 diabetes mellitus with diabetic polyneuropathy: Secondary | ICD-10-CM | POA: Diagnosis not present

## 2020-03-26 DIAGNOSIS — Z9181 History of falling: Secondary | ICD-10-CM | POA: Diagnosis not present

## 2020-03-26 DIAGNOSIS — G2581 Restless legs syndrome: Secondary | ICD-10-CM | POA: Diagnosis not present

## 2020-03-26 DIAGNOSIS — G43919 Migraine, unspecified, intractable, without status migrainosus: Secondary | ICD-10-CM | POA: Diagnosis not present

## 2020-03-26 DIAGNOSIS — E1165 Type 2 diabetes mellitus with hyperglycemia: Secondary | ICD-10-CM | POA: Diagnosis not present

## 2020-03-27 DIAGNOSIS — G2581 Restless legs syndrome: Secondary | ICD-10-CM | POA: Diagnosis not present

## 2020-03-27 DIAGNOSIS — E785 Hyperlipidemia, unspecified: Secondary | ICD-10-CM | POA: Diagnosis not present

## 2020-03-27 DIAGNOSIS — I10 Essential (primary) hypertension: Secondary | ICD-10-CM | POA: Diagnosis not present

## 2020-03-27 DIAGNOSIS — M1990 Unspecified osteoarthritis, unspecified site: Secondary | ICD-10-CM | POA: Diagnosis not present

## 2020-03-27 DIAGNOSIS — N200 Calculus of kidney: Secondary | ICD-10-CM | POA: Diagnosis not present

## 2020-03-27 DIAGNOSIS — Z9181 History of falling: Secondary | ICD-10-CM | POA: Diagnosis not present

## 2020-03-27 DIAGNOSIS — E1142 Type 2 diabetes mellitus with diabetic polyneuropathy: Secondary | ICD-10-CM | POA: Diagnosis not present

## 2020-03-27 DIAGNOSIS — G4700 Insomnia, unspecified: Secondary | ICD-10-CM | POA: Diagnosis not present

## 2020-03-27 DIAGNOSIS — E1165 Type 2 diabetes mellitus with hyperglycemia: Secondary | ICD-10-CM | POA: Diagnosis not present

## 2020-03-27 DIAGNOSIS — K5909 Other constipation: Secondary | ICD-10-CM | POA: Diagnosis not present

## 2020-03-27 DIAGNOSIS — Z96653 Presence of artificial knee joint, bilateral: Secondary | ICD-10-CM | POA: Diagnosis not present

## 2020-03-27 DIAGNOSIS — J449 Chronic obstructive pulmonary disease, unspecified: Secondary | ICD-10-CM | POA: Diagnosis not present

## 2020-03-27 DIAGNOSIS — G43919 Migraine, unspecified, intractable, without status migrainosus: Secondary | ICD-10-CM | POA: Diagnosis not present

## 2020-03-27 DIAGNOSIS — K2270 Barrett's esophagus without dysplasia: Secondary | ICD-10-CM | POA: Diagnosis not present

## 2020-03-27 DIAGNOSIS — M80052D Age-related osteoporosis with current pathological fracture, left femur, subsequent encounter for fracture with routine healing: Secondary | ICD-10-CM | POA: Diagnosis not present

## 2020-03-27 DIAGNOSIS — Z7951 Long term (current) use of inhaled steroids: Secondary | ICD-10-CM | POA: Diagnosis not present

## 2020-03-27 DIAGNOSIS — M9702XD Periprosthetic fracture around internal prosthetic left hip joint, subsequent encounter: Secondary | ICD-10-CM | POA: Diagnosis not present

## 2020-03-27 DIAGNOSIS — M069 Rheumatoid arthritis, unspecified: Secondary | ICD-10-CM | POA: Diagnosis not present

## 2020-04-02 ENCOUNTER — Ambulatory Visit: Payer: Medicare Other | Admitting: Internal Medicine

## 2020-04-02 DIAGNOSIS — M80052D Age-related osteoporosis with current pathological fracture, left femur, subsequent encounter for fracture with routine healing: Secondary | ICD-10-CM | POA: Diagnosis not present

## 2020-04-02 DIAGNOSIS — Z7951 Long term (current) use of inhaled steroids: Secondary | ICD-10-CM | POA: Diagnosis not present

## 2020-04-02 DIAGNOSIS — E1165 Type 2 diabetes mellitus with hyperglycemia: Secondary | ICD-10-CM | POA: Diagnosis not present

## 2020-04-02 DIAGNOSIS — G43919 Migraine, unspecified, intractable, without status migrainosus: Secondary | ICD-10-CM | POA: Diagnosis not present

## 2020-04-02 DIAGNOSIS — K5909 Other constipation: Secondary | ICD-10-CM | POA: Diagnosis not present

## 2020-04-02 DIAGNOSIS — N200 Calculus of kidney: Secondary | ICD-10-CM | POA: Diagnosis not present

## 2020-04-02 DIAGNOSIS — M9702XD Periprosthetic fracture around internal prosthetic left hip joint, subsequent encounter: Secondary | ICD-10-CM | POA: Diagnosis not present

## 2020-04-02 DIAGNOSIS — M1990 Unspecified osteoarthritis, unspecified site: Secondary | ICD-10-CM | POA: Diagnosis not present

## 2020-04-02 DIAGNOSIS — G4700 Insomnia, unspecified: Secondary | ICD-10-CM | POA: Diagnosis not present

## 2020-04-02 DIAGNOSIS — I10 Essential (primary) hypertension: Secondary | ICD-10-CM | POA: Diagnosis not present

## 2020-04-02 DIAGNOSIS — Z96653 Presence of artificial knee joint, bilateral: Secondary | ICD-10-CM | POA: Diagnosis not present

## 2020-04-02 DIAGNOSIS — M069 Rheumatoid arthritis, unspecified: Secondary | ICD-10-CM | POA: Diagnosis not present

## 2020-04-02 DIAGNOSIS — G2581 Restless legs syndrome: Secondary | ICD-10-CM | POA: Diagnosis not present

## 2020-04-02 DIAGNOSIS — J449 Chronic obstructive pulmonary disease, unspecified: Secondary | ICD-10-CM | POA: Diagnosis not present

## 2020-04-02 DIAGNOSIS — K2270 Barrett's esophagus without dysplasia: Secondary | ICD-10-CM | POA: Diagnosis not present

## 2020-04-02 DIAGNOSIS — Z9181 History of falling: Secondary | ICD-10-CM | POA: Diagnosis not present

## 2020-04-02 DIAGNOSIS — E1142 Type 2 diabetes mellitus with diabetic polyneuropathy: Secondary | ICD-10-CM | POA: Diagnosis not present

## 2020-04-02 DIAGNOSIS — E785 Hyperlipidemia, unspecified: Secondary | ICD-10-CM | POA: Diagnosis not present

## 2020-04-03 ENCOUNTER — Encounter: Payer: Self-pay | Admitting: Hospice and Palliative Medicine

## 2020-04-03 ENCOUNTER — Ambulatory Visit (INDEPENDENT_AMBULATORY_CARE_PROVIDER_SITE_OTHER): Payer: Medicaid Other | Admitting: Hospice and Palliative Medicine

## 2020-04-03 ENCOUNTER — Other Ambulatory Visit: Payer: Self-pay

## 2020-04-03 VITALS — BP 136/70 | HR 94 | Temp 97.7°F | Resp 16 | Ht 63.0 in

## 2020-04-03 DIAGNOSIS — J45909 Unspecified asthma, uncomplicated: Secondary | ICD-10-CM

## 2020-04-03 DIAGNOSIS — Z8781 Personal history of (healed) traumatic fracture: Secondary | ICD-10-CM | POA: Diagnosis not present

## 2020-04-03 DIAGNOSIS — R29818 Other symptoms and signs involving the nervous system: Secondary | ICD-10-CM

## 2020-04-03 DIAGNOSIS — Z0001 Encounter for general adult medical examination with abnormal findings: Secondary | ICD-10-CM

## 2020-04-03 MED ORDER — BREO ELLIPTA 200-25 MCG/INH IN AEPB: 1.0000 | INHALATION_SPRAY | Freq: Every day | RESPIRATORY_TRACT | 3 refills | Status: DC

## 2020-04-03 MED ORDER — OXYCODONE HCL 5 MG PO TABS
5.0000 mg | ORAL_TABLET | Freq: Two times a day (BID) | ORAL | 0 refills | Status: DC
Start: 2020-04-03 — End: 2020-04-03

## 2020-04-03 MED ORDER — OXYCODONE HCL 5 MG PO TABS: 5.0000 mg | ORAL_TABLET | Freq: Two times a day (BID) | ORAL | 0 refills | Status: DC

## 2020-04-03 MED ORDER — BIOTIN 800 MCG PO TABS: 800.0000 ug | ORAL_TABLET | Freq: Every day | ORAL | 1 refills | Status: DC

## 2020-04-03 NOTE — Progress Notes (Signed)
[facility] [place], [place] [address]  Internal MEDICINE  Office Visit Note  Patient Name: Summer Murphy  [id number]  [id number]  Date of Service: 04/04/2020  Chief Complaint  Patient presents with  . Follow-up    Needs paper for aid and handicap sticker paper work  . Depression  . Gastroesophageal Reflux  . Hypertension    HPI Patient is here for routine follow-up She has been recently discharged from acute rehab due to periprosthetic supracondylar fracture of left femur--will now being going to outpatient PT, remains non-weight bearing to left lower extremity She is complaining of ongoing pain--scheduled to follow-up with surgeon on Friday No complications since surgery, healing well Requesting a few refills today Also wanting to have her handicap sticker renewed--she will need to bring me the paperwork to sign for this, did not bring it with her today  Breathing has remained stable--using Breo with no complications    Current Medication: Outpatient Encounter Medications as of 04/03/2020  Medication Sig  . Biotin 800 MCG TABS Take 1 tablet (800 mcg total) by mouth daily.  . Brexpiprazole (REXULTI) 4 MG TABS Take 4 mg by mouth daily.   . busPIRone (BUSPAR) 10 MG tablet Take 10 mg by mouth 2 (two) times daily.   . cetirizine (ZYRTEC) 10 MG tablet Take 1 tablet (10 mg total) by mouth daily.  . diclofenac sodium (VOLTAREN) 1 % GEL Apply 2 g topically in the morning, at noon, and at bedtime.   Marland Kitchen diltiazem (DILACOR XR) 180 MG 24 hr capsule Take 1 capsule (180 mg total) by mouth daily.  . diphenhydrAMINE (BENADRYL) 25 MG tablet Take 50 mg by mouth 2 (two) times daily as needed.   . diphenoxylate-atropine (LOMOTIL) 2.5-0.025 MG tablet Take 1 tablet by mouth 3 (three) times daily as needed for diarrhea or loose stools.  . docusate sodium (COLACE) 100 MG capsule Take 1 capsule (100 mg total) by mouth 2 (two) times daily.  . DULoxetine (CYMBALTA) 60 MG  capsule Take 60 mg by mouth daily.  . fluticasone furoate-vilanterol (BREO ELLIPTA) 200-25 MCG/INH AEPB Inhale 1 puff into the lungs daily.  Marland Kitchen gabapentin (NEURONTIN) 600 MG tablet Take 1 tablet (600 mg total) by mouth 2 (two) times daily.  . Ipratropium-Albuterol (COMBIVENT RESPIMAT) 20-100 MCG/ACT AERS respimat INHALE 1 PUFF INTO THE LUNGS EVERY 6 HOURS  . ipratropium-albuterol (DUONEB) 0.5-2.5 (3) MG/3ML SOLN Take 3 mLs by nebulization every 4 (four) hours as needed (for shortness of breath/wheezing).  Marland Kitchen ketoconazole (NIZORAL) 2 % cream Apply 1 application topically daily. (Patient taking differently: Apply 1 application topically daily as needed for irritation. )  . LORazepam (ATIVAN) 1 MG tablet Take 1 mg by mouth at bedtime as needed for sleep.   . metoprolol tartrate (LOPRESSOR) 25 MG tablet Take 1 tablet (25 mg total) by mouth 2 (two) times daily.  . montelukast (SINGULAIR) 10 MG tablet TAKE 1 TABLET(10 MG) BY MOUTH AT BEDTIME (Patient taking differently: Take 10 mg by mouth at bedtime. )  . ondansetron (ZOFRAN) 4 MG tablet Take 1 tablet (4 mg total) by mouth every 6 (six) hours as needed for nausea.  Marland Kitchen oxybutynin (DITROPAN) 5 MG tablet Take 5 mg by mouth in the morning and at bedtime.  Marland Kitchen oxyCODONE (OXY IR/ROXICODONE) 5 MG immediate release tablet Take 1 tablet (5 mg total) by mouth in the morning and at bedtime.  Marland Kitchen rOPINIRole (REQUIP) 0.5 MG tablet Take two tablets po BID for restless legs. (Patient  taking differently: Take 1 mg by mouth in the morning and at bedtime. )  . SUMAtriptan (IMITREX) 100 MG tablet Take 1 tablet (100 mg total) by mouth every 2 (two) hours as needed (for migraine headaches.). May repeat in 1 hours if headache persists or recurs.  . Tiotropium Bromide Monohydrate (SPIRIVA RESPIMAT) 1.25 MCG/ACT AERS Inhale 2 puffs into the lungs daily.  . traZODone (DESYREL) 150 MG tablet Take 150-300 mg by mouth at bedtime.   . valACYclovir (VALTREX) 500 MG tablet Take 500 mg by  mouth daily as needed (for fever blisters).  . [DISCONTINUED] BIOTIN PO Take 1 tablet by mouth daily.   . [DISCONTINUED] enoxaparin (LOVENOX) 40 MG/0.4ML injection Inject 0.4 mLs (40 mg total) into the skin daily for 14 days.  . [DISCONTINUED] fluticasone furoate-vilanterol (BREO ELLIPTA) 200-25 MCG/INH AEPB Inhale 1 puff into the lungs daily.  . [DISCONTINUED] oxyCODONE (OXY IR/ROXICODONE) 5 MG immediate release tablet Take 1-2 tablets (5-10 mg total) by mouth every 4 (four) hours as needed for moderate pain or severe pain.  . [DISCONTINUED] oxyCODONE (OXY IR/ROXICODONE) 5 MG immediate release tablet Take 1-2 tablets (5-10 mg total) by mouth in the morning and at bedtime.  . [DISCONTINUED] traMADol (ULTRAM) 50 MG tablet Take 50 mg by mouth every 6 (six) hours as needed for moderate pain.   No facility-administered encounter medications on file as of 04/03/2020.    Surgical History: Past Surgical History:  Procedure Laterality Date  . ABDOMINAL HYSTERECTOMY    . AMPUTATION TOE Left 07/31/2016   Procedure: AMPUTATION TOE/MPJ 2nd toe;  Surgeon: Sharlotte Alamo, DPM;  Location: [facility] ORS;  Service: Podiatry;  Laterality: Left;  . APPENDECTOMY  1990  . BACK SURGERY     low back  . BREAST SURGERY     bilateral breast reduction  . CARDIAC ELECTROPHYSIOLOGY STUDY AND ABLATION    . CHOLECYSTECTOMY  1990  . COLONOSCOPY WITH PROPOFOL N/A 01/04/2017   Procedure: COLONOSCOPY WITH PROPOFOL;  Surgeon: Manya Silvas, MD;  Location: [facility] ENDOSCOPY;  Service: Endoscopy;  Laterality: N/A;  . CORNEAL TRANSPLANT    . ESOPHAGOGASTRODUODENOSCOPY (EGD) WITH PROPOFOL N/A 01/04/2017   Procedure: ESOPHAGOGASTRODUODENOSCOPY (EGD) WITH PROPOFOL;  Surgeon: Manya Silvas, MD;  Location: [facility] ENDOSCOPY;  Service: Endoscopy;  Laterality: N/A;  . EXCISION BONE CYST Left 07/31/2016   Procedure: EXCISION BONE CYST/exostectomy 28124/left 2nd;  Surgeon: Sharlotte Alamo, DPM;  Location: [facility] ORS;  Service: Podiatry;   Laterality: Left;  . EXTRACORPOREAL SHOCK WAVE LITHOTRIPSY Left 09/12/2015   Procedure: EXTRACORPOREAL SHOCK WAVE LITHOTRIPSY (ESWL);  Surgeon: Hollice Espy, MD;  Location: [facility] ORS;  Service: Urology;  Laterality: Left;  . FRACTURE SURGERY     left foot  . HH repair    . JOINT REPLACEMENT Bilateral 2013,2014   total knees  . LAPAROSCOPIC HYSTERECTOMY    . LITHOTRIPSY    . TONSILLECTOMY    . TOTAL KNEE REVISION Right 11/14/2019   Procedure: Revision patella and tibial polyethylene;  Surgeon: Hessie Knows, MD;  Location: [facility] ORS;  Service: Orthopedics;  Laterality: Right;  . URETEROSCOPY      Medical History: Past Medical History:  Diagnosis Date  . Acid reflux   . Anxiety   . Arrhythmia    treated with meds and has no current problems  . Arthritis    most uncomfortable in knees  . Asthma    uses several inhalers  . Depression   . Fever blister   . Hematuria   . History of kidney  stones   . Hypertension   . Hypoglycemia   . Left flank pain   . Migraine   . Restless leg   . Yeast vaginitis     Family History: Family History  Problem Relation Age of Onset  . Prostate cancer Father   . Kidney Stones Father   . Kidney disease Neg Hx     Social History   Socioeconomic History  . Marital status: Married    Spouse name: Not on file  . Number of children: Not on file  . Years of education: Not on file  . Highest education level: Not on file  Occupational History  . Not on file  Tobacco Use  . Smoking status: Never Smoker  . Smokeless tobacco: Never Used  Vaping Use  . Vaping Use: Never used  Substance and Sexual Activity  . Alcohol use: No  . Drug use: No  . Sexual activity: Not on file  Other Topics Concern  . Not on file  Social History Narrative  . Not on file   Social Determinants of Health   Financial Resource Strain: Not on file  Food Insecurity: Not on file  Transportation Needs: Not on file  Physical Activity: Not on file  Stress: Not on  file  Social Connections: Not on file  Intimate Partner Violence: Not on file      Review of Systems  Constitutional: Negative for chills, diaphoresis and fatigue.  HENT: Negative for ear pain, postnasal drip and sinus pressure.   Eyes: Negative for photophobia, discharge, redness, itching and visual disturbance.  Respiratory: Negative for cough, shortness of breath and wheezing.   Cardiovascular: Negative for chest pain, palpitations and leg swelling.  Gastrointestinal: Negative for abdominal pain, constipation, diarrhea, nausea and vomiting.  Genitourinary: Negative for dysuria and flank pain.  Musculoskeletal: Negative for arthralgias, back pain, gait problem and neck pain.       S/p left femur surgery, NWB, in wheelchair at this time  Skin: Negative for color change.  Allergic/Immunologic: Negative for environmental allergies and food allergies.  Neurological: Negative for dizziness and headaches.  Hematological: Does not bruise/bleed easily.  Psychiatric/Behavioral: Negative for agitation, behavioral problems (depression) and hallucinations.    Vital Signs: BP 136/70   Pulse 94   Temp 97.7 F (36.5 C)   Resp 16   Ht 5\' 3"  (1.6 m)   SpO2 97%   BMI 30.47 kg/m    Physical Exam Vitals reviewed.  Constitutional:      Appearance: Normal appearance. She is normal weight.  Cardiovascular:     Rate and Rhythm: Normal rate and regular rhythm.     Pulses: Normal pulses.     Heart sounds: Normal heart sounds.  Pulmonary:     Effort: Pulmonary effort is normal.     Breath sounds: Normal breath sounds.  Abdominal:     General: Abdomen is flat.  Musculoskeletal:        General: Normal range of motion.     Cervical back: Normal range of motion.     Comments: NWB left lower extremity  Skin:    General: Skin is warm.  Neurological:     General: No focal deficit present.     Mental Status: She is alert and oriented to person, place, and time. Mental status is at baseline.   Psychiatric:        Mood and Affect: Mood normal.        Behavior: Behavior normal.  Thought Content: Thought content normal.        Judgment: Judgment normal.    Assessment/Plan: 1. Status post closed fracture of left femur Encouraged to start using acetaminophen for pain relief--oxycodone prescribed 10 tablets to last until her follow-up appointment with surgeon scheduled on Friday Advised she will likely not receive further narcotic pain medication from surgeon and we will not prescribe narcotics for this pain - oxyCODONE (OXY IR/ROXICODONE) 5 MG immediate release tablet; Take 1 tablet (5 mg total) by mouth in the morning and at bedtime.  Dispense: 10 tablet; Refill: 0  2. Moderate asthma without complication, unspecified whether persistent Breathing has remained stable, requesting refills of Breo, will continue to monitor - fluticasone furoate-vilanterol (BREO ELLIPTA) 200-25 MCG/INH AEPB; Inhale 1 puff into the lungs daily.  Dispense: 60 each; Refill: 3  3. Other symptoms and signs involving the nervous system Requesting refills, continue to monitor - Biotin 800 MCG TABS; Take 1 tablet (800 mcg total) by mouth daily.  Dispense: 90 tablet; Refill: 1  General Counseling: Geniva verbalizes understanding of the findings of todays visit and agrees with plan of treatment. I have discussed any further diagnostic evaluation that may be needed or ordered today. We also reviewed her medications today. she has been encouraged to call the office with any questions or concerns that should arise related to todays visit.   Meds ordered this encounter  Medications  . Biotin 800 MCG TABS    Sig: Take 1 tablet (800 mcg total) by mouth daily.    Dispense:  90 tablet    Refill:  1  . fluticasone furoate-vilanterol (BREO ELLIPTA) 200-25 MCG/INH AEPB    Sig: Inhale 1 puff into the lungs daily.    Dispense:  60 each    Refill:  3  . DISCONTD: oxyCODONE (OXY IR/ROXICODONE) 5 MG immediate  release tablet    Sig: Take 1-2 tablets (5-10 mg total) by mouth in the morning and at bedtime.    Dispense:  10 tablet    Refill:  0  . oxyCODONE (OXY IR/ROXICODONE) 5 MG immediate release tablet    Sig: Take 1 tablet (5 mg total) by mouth in the morning and at bedtime.    Dispense:  10 tablet    Refill:  0    Time spent:30 Minutes   This patient was seen by Theodoro Grist AGNP-C in Collaboration with Dr Lavera Guise as a part of collaborative care agreement     Tanna Furry. Taisei Bonnette AGNP-C Internal medicine

## 2020-04-04 ENCOUNTER — Encounter: Payer: Self-pay | Admitting: Hospice and Palliative Medicine

## 2020-04-04 DIAGNOSIS — M80052D Age-related osteoporosis with current pathological fracture, left femur, subsequent encounter for fracture with routine healing: Secondary | ICD-10-CM | POA: Diagnosis not present

## 2020-04-04 DIAGNOSIS — M1990 Unspecified osteoarthritis, unspecified site: Secondary | ICD-10-CM | POA: Diagnosis not present

## 2020-04-04 DIAGNOSIS — G2581 Restless legs syndrome: Secondary | ICD-10-CM | POA: Diagnosis not present

## 2020-04-04 DIAGNOSIS — I10 Essential (primary) hypertension: Secondary | ICD-10-CM | POA: Diagnosis not present

## 2020-04-04 DIAGNOSIS — Z9181 History of falling: Secondary | ICD-10-CM | POA: Diagnosis not present

## 2020-04-04 DIAGNOSIS — Z7951 Long term (current) use of inhaled steroids: Secondary | ICD-10-CM | POA: Diagnosis not present

## 2020-04-04 DIAGNOSIS — G43919 Migraine, unspecified, intractable, without status migrainosus: Secondary | ICD-10-CM | POA: Diagnosis not present

## 2020-04-04 DIAGNOSIS — J449 Chronic obstructive pulmonary disease, unspecified: Secondary | ICD-10-CM | POA: Diagnosis not present

## 2020-04-04 DIAGNOSIS — E1142 Type 2 diabetes mellitus with diabetic polyneuropathy: Secondary | ICD-10-CM | POA: Diagnosis not present

## 2020-04-04 DIAGNOSIS — K2270 Barrett's esophagus without dysplasia: Secondary | ICD-10-CM | POA: Diagnosis not present

## 2020-04-04 DIAGNOSIS — M9702XD Periprosthetic fracture around internal prosthetic left hip joint, subsequent encounter: Secondary | ICD-10-CM | POA: Diagnosis not present

## 2020-04-04 DIAGNOSIS — M069 Rheumatoid arthritis, unspecified: Secondary | ICD-10-CM | POA: Diagnosis not present

## 2020-04-04 DIAGNOSIS — G4700 Insomnia, unspecified: Secondary | ICD-10-CM | POA: Diagnosis not present

## 2020-04-04 DIAGNOSIS — K5909 Other constipation: Secondary | ICD-10-CM | POA: Diagnosis not present

## 2020-04-04 DIAGNOSIS — E1165 Type 2 diabetes mellitus with hyperglycemia: Secondary | ICD-10-CM | POA: Diagnosis not present

## 2020-04-04 DIAGNOSIS — N200 Calculus of kidney: Secondary | ICD-10-CM | POA: Diagnosis not present

## 2020-04-04 DIAGNOSIS — Z96653 Presence of artificial knee joint, bilateral: Secondary | ICD-10-CM | POA: Diagnosis not present

## 2020-04-04 DIAGNOSIS — E785 Hyperlipidemia, unspecified: Secondary | ICD-10-CM | POA: Diagnosis not present

## 2020-04-09 ENCOUNTER — Other Ambulatory Visit: Payer: Self-pay

## 2020-04-09 DIAGNOSIS — Z7951 Long term (current) use of inhaled steroids: Secondary | ICD-10-CM | POA: Diagnosis not present

## 2020-04-09 DIAGNOSIS — E1142 Type 2 diabetes mellitus with diabetic polyneuropathy: Secondary | ICD-10-CM | POA: Diagnosis not present

## 2020-04-09 DIAGNOSIS — J449 Chronic obstructive pulmonary disease, unspecified: Secondary | ICD-10-CM | POA: Diagnosis not present

## 2020-04-09 DIAGNOSIS — G2581 Restless legs syndrome: Secondary | ICD-10-CM | POA: Diagnosis not present

## 2020-04-09 DIAGNOSIS — N200 Calculus of kidney: Secondary | ICD-10-CM | POA: Diagnosis not present

## 2020-04-09 DIAGNOSIS — E785 Hyperlipidemia, unspecified: Secondary | ICD-10-CM | POA: Diagnosis not present

## 2020-04-09 DIAGNOSIS — M80052D Age-related osteoporosis with current pathological fracture, left femur, subsequent encounter for fracture with routine healing: Secondary | ICD-10-CM | POA: Diagnosis not present

## 2020-04-09 DIAGNOSIS — M069 Rheumatoid arthritis, unspecified: Secondary | ICD-10-CM | POA: Diagnosis not present

## 2020-04-09 DIAGNOSIS — Z96653 Presence of artificial knee joint, bilateral: Secondary | ICD-10-CM | POA: Diagnosis not present

## 2020-04-09 DIAGNOSIS — M9702XD Periprosthetic fracture around internal prosthetic left hip joint, subsequent encounter: Secondary | ICD-10-CM | POA: Diagnosis not present

## 2020-04-09 DIAGNOSIS — I10 Essential (primary) hypertension: Secondary | ICD-10-CM | POA: Diagnosis not present

## 2020-04-09 DIAGNOSIS — K2270 Barrett's esophagus without dysplasia: Secondary | ICD-10-CM | POA: Diagnosis not present

## 2020-04-09 DIAGNOSIS — Z9181 History of falling: Secondary | ICD-10-CM | POA: Diagnosis not present

## 2020-04-09 DIAGNOSIS — E1165 Type 2 diabetes mellitus with hyperglycemia: Secondary | ICD-10-CM | POA: Diagnosis not present

## 2020-04-09 DIAGNOSIS — M1990 Unspecified osteoarthritis, unspecified site: Secondary | ICD-10-CM | POA: Diagnosis not present

## 2020-04-09 DIAGNOSIS — K5909 Other constipation: Secondary | ICD-10-CM | POA: Diagnosis not present

## 2020-04-09 DIAGNOSIS — G4700 Insomnia, unspecified: Secondary | ICD-10-CM | POA: Diagnosis not present

## 2020-04-09 DIAGNOSIS — G43919 Migraine, unspecified, intractable, without status migrainosus: Secondary | ICD-10-CM | POA: Diagnosis not present

## 2020-04-09 MED ORDER — ROPINIROLE HCL 0.5 MG PO TABS: ORAL_TABLET | ORAL | 0 refills | Status: DC

## 2020-04-10 DIAGNOSIS — I10 Essential (primary) hypertension: Secondary | ICD-10-CM | POA: Diagnosis not present

## 2020-04-10 DIAGNOSIS — G2581 Restless legs syndrome: Secondary | ICD-10-CM | POA: Diagnosis not present

## 2020-04-10 DIAGNOSIS — N200 Calculus of kidney: Secondary | ICD-10-CM | POA: Diagnosis not present

## 2020-04-10 DIAGNOSIS — Z7951 Long term (current) use of inhaled steroids: Secondary | ICD-10-CM | POA: Diagnosis not present

## 2020-04-10 DIAGNOSIS — M80052D Age-related osteoporosis with current pathological fracture, left femur, subsequent encounter for fracture with routine healing: Secondary | ICD-10-CM | POA: Diagnosis not present

## 2020-04-10 DIAGNOSIS — K5909 Other constipation: Secondary | ICD-10-CM | POA: Diagnosis not present

## 2020-04-10 DIAGNOSIS — Z9181 History of falling: Secondary | ICD-10-CM | POA: Diagnosis not present

## 2020-04-10 DIAGNOSIS — M9702XD Periprosthetic fracture around internal prosthetic left hip joint, subsequent encounter: Secondary | ICD-10-CM | POA: Diagnosis not present

## 2020-04-10 DIAGNOSIS — E1142 Type 2 diabetes mellitus with diabetic polyneuropathy: Secondary | ICD-10-CM | POA: Diagnosis not present

## 2020-04-10 DIAGNOSIS — M069 Rheumatoid arthritis, unspecified: Secondary | ICD-10-CM | POA: Diagnosis not present

## 2020-04-10 DIAGNOSIS — K2270 Barrett's esophagus without dysplasia: Secondary | ICD-10-CM | POA: Diagnosis not present

## 2020-04-10 DIAGNOSIS — Z96653 Presence of artificial knee joint, bilateral: Secondary | ICD-10-CM | POA: Diagnosis not present

## 2020-04-10 DIAGNOSIS — E1165 Type 2 diabetes mellitus with hyperglycemia: Secondary | ICD-10-CM | POA: Diagnosis not present

## 2020-04-10 DIAGNOSIS — J449 Chronic obstructive pulmonary disease, unspecified: Secondary | ICD-10-CM | POA: Diagnosis not present

## 2020-04-10 DIAGNOSIS — G43919 Migraine, unspecified, intractable, without status migrainosus: Secondary | ICD-10-CM | POA: Diagnosis not present

## 2020-04-10 DIAGNOSIS — E785 Hyperlipidemia, unspecified: Secondary | ICD-10-CM | POA: Diagnosis not present

## 2020-04-10 DIAGNOSIS — M1990 Unspecified osteoarthritis, unspecified site: Secondary | ICD-10-CM | POA: Diagnosis not present

## 2020-04-10 DIAGNOSIS — G4700 Insomnia, unspecified: Secondary | ICD-10-CM | POA: Diagnosis not present

## 2020-04-11 ENCOUNTER — Telehealth: Payer: Self-pay

## 2020-04-11 DIAGNOSIS — K2270 Barrett's esophagus without dysplasia: Secondary | ICD-10-CM | POA: Diagnosis not present

## 2020-04-11 DIAGNOSIS — J449 Chronic obstructive pulmonary disease, unspecified: Secondary | ICD-10-CM | POA: Diagnosis not present

## 2020-04-11 DIAGNOSIS — M1990 Unspecified osteoarthritis, unspecified site: Secondary | ICD-10-CM | POA: Diagnosis not present

## 2020-04-11 DIAGNOSIS — E1165 Type 2 diabetes mellitus with hyperglycemia: Secondary | ICD-10-CM | POA: Diagnosis not present

## 2020-04-11 DIAGNOSIS — G2581 Restless legs syndrome: Secondary | ICD-10-CM | POA: Diagnosis not present

## 2020-04-11 DIAGNOSIS — Z9181 History of falling: Secondary | ICD-10-CM | POA: Diagnosis not present

## 2020-04-11 DIAGNOSIS — M80052D Age-related osteoporosis with current pathological fracture, left femur, subsequent encounter for fracture with routine healing: Secondary | ICD-10-CM | POA: Diagnosis not present

## 2020-04-11 DIAGNOSIS — Z96653 Presence of artificial knee joint, bilateral: Secondary | ICD-10-CM | POA: Diagnosis not present

## 2020-04-11 DIAGNOSIS — Z7951 Long term (current) use of inhaled steroids: Secondary | ICD-10-CM | POA: Diagnosis not present

## 2020-04-11 DIAGNOSIS — I10 Essential (primary) hypertension: Secondary | ICD-10-CM | POA: Diagnosis not present

## 2020-04-11 DIAGNOSIS — N200 Calculus of kidney: Secondary | ICD-10-CM | POA: Diagnosis not present

## 2020-04-11 DIAGNOSIS — E1142 Type 2 diabetes mellitus with diabetic polyneuropathy: Secondary | ICD-10-CM | POA: Diagnosis not present

## 2020-04-11 DIAGNOSIS — K5909 Other constipation: Secondary | ICD-10-CM | POA: Diagnosis not present

## 2020-04-11 DIAGNOSIS — M9702XD Periprosthetic fracture around internal prosthetic left hip joint, subsequent encounter: Secondary | ICD-10-CM | POA: Diagnosis not present

## 2020-04-11 DIAGNOSIS — E785 Hyperlipidemia, unspecified: Secondary | ICD-10-CM | POA: Diagnosis not present

## 2020-04-11 DIAGNOSIS — G43919 Migraine, unspecified, intractable, without status migrainosus: Secondary | ICD-10-CM | POA: Diagnosis not present

## 2020-04-11 DIAGNOSIS — G4700 Insomnia, unspecified: Secondary | ICD-10-CM | POA: Diagnosis not present

## 2020-04-11 DIAGNOSIS — M069 Rheumatoid arthritis, unspecified: Secondary | ICD-10-CM | POA: Diagnosis not present

## 2020-04-11 NOTE — Telephone Encounter (Signed)
Spoke to [place] from [place] and she advised that patient has a huge knot on side of left outer part of knee cap and when she moves her knee it sounds like crunching and feels that it is a pocket of fluid.  Pt has not had any falls or injury to the area, not red or any fever on area.  Pt has history of blood clots.  I spoke to Dr Humphrey Rolls and she asked that pt make an appointment for Monday to be seen.  I informed pt that if she has any problems over the weekend to go to urgent care or ER.

## 2020-04-12 ENCOUNTER — Encounter: Payer: Self-pay | Admitting: Physician Assistant

## 2020-04-12 ENCOUNTER — Other Ambulatory Visit: Payer: Self-pay | Admitting: Hospice and Palliative Medicine

## 2020-04-12 ENCOUNTER — Ambulatory Visit: Payer: Medicaid Other | Admitting: Hospice and Palliative Medicine

## 2020-04-12 ENCOUNTER — Ambulatory Visit: Payer: Medicare Other | Admitting: Physician Assistant

## 2020-04-12 ENCOUNTER — Other Ambulatory Visit: Payer: Self-pay

## 2020-04-12 DIAGNOSIS — E785 Hyperlipidemia, unspecified: Secondary | ICD-10-CM | POA: Diagnosis not present

## 2020-04-12 DIAGNOSIS — I10 Essential (primary) hypertension: Secondary | ICD-10-CM | POA: Diagnosis not present

## 2020-04-12 DIAGNOSIS — E1142 Type 2 diabetes mellitus with diabetic polyneuropathy: Secondary | ICD-10-CM | POA: Diagnosis not present

## 2020-04-12 DIAGNOSIS — M80052D Age-related osteoporosis with current pathological fracture, left femur, subsequent encounter for fracture with routine healing: Secondary | ICD-10-CM | POA: Diagnosis not present

## 2020-04-12 DIAGNOSIS — M1990 Unspecified osteoarthritis, unspecified site: Secondary | ICD-10-CM | POA: Diagnosis not present

## 2020-04-12 DIAGNOSIS — M9702XD Periprosthetic fracture around internal prosthetic left hip joint, subsequent encounter: Secondary | ICD-10-CM | POA: Diagnosis not present

## 2020-04-12 DIAGNOSIS — K2270 Barrett's esophagus without dysplasia: Secondary | ICD-10-CM | POA: Diagnosis not present

## 2020-04-12 DIAGNOSIS — M069 Rheumatoid arthritis, unspecified: Secondary | ICD-10-CM | POA: Diagnosis not present

## 2020-04-12 DIAGNOSIS — Z96653 Presence of artificial knee joint, bilateral: Secondary | ICD-10-CM | POA: Diagnosis not present

## 2020-04-12 DIAGNOSIS — N200 Calculus of kidney: Secondary | ICD-10-CM | POA: Diagnosis not present

## 2020-04-12 DIAGNOSIS — G4700 Insomnia, unspecified: Secondary | ICD-10-CM | POA: Diagnosis not present

## 2020-04-12 DIAGNOSIS — K5909 Other constipation: Secondary | ICD-10-CM | POA: Diagnosis not present

## 2020-04-12 DIAGNOSIS — Z9181 History of falling: Secondary | ICD-10-CM | POA: Diagnosis not present

## 2020-04-12 DIAGNOSIS — M25562 Pain in left knee: Secondary | ICD-10-CM | POA: Diagnosis not present

## 2020-04-12 DIAGNOSIS — J449 Chronic obstructive pulmonary disease, unspecified: Secondary | ICD-10-CM | POA: Diagnosis not present

## 2020-04-12 DIAGNOSIS — Z7951 Long term (current) use of inhaled steroids: Secondary | ICD-10-CM | POA: Diagnosis not present

## 2020-04-12 DIAGNOSIS — G2581 Restless legs syndrome: Secondary | ICD-10-CM | POA: Diagnosis not present

## 2020-04-12 DIAGNOSIS — E1165 Type 2 diabetes mellitus with hyperglycemia: Secondary | ICD-10-CM | POA: Diagnosis not present

## 2020-04-12 DIAGNOSIS — G43919 Migraine, unspecified, intractable, without status migrainosus: Secondary | ICD-10-CM | POA: Diagnosis not present

## 2020-04-12 NOTE — Progress Notes (Signed)
Patient comes to clinic today complaining of "knot" to left lateral knee S/p left periprosthetic supracondylar fracture of femur repair r01/01/2021 at [place] is hard, crepitus present with knee extension, pain associated Appears to be bone/scar tissue malformation? Attempted to call Hildred Alamin, PA at [facility] service S/p right knee replacement by Dr. Rudene Christians within [place] closed today  No evidence of fluid, infection or DVT Advised her to go be seen at [facility] urgent care to ensure this was not an emergency issue with bone malformation Advised to contact her surgeon from [place] right away as well--scheduled for follow-up next Friday  Patient requested further pain medication--unable to give at this time, will need to be prescribed from surgeon

## 2020-04-14 DIAGNOSIS — M6281 Muscle weakness (generalized): Secondary | ICD-10-CM | POA: Diagnosis not present

## 2020-04-14 DIAGNOSIS — M978XXD Periprosthetic fracture around other internal prosthetic joint, subsequent encounter: Secondary | ICD-10-CM | POA: Diagnosis not present

## 2020-04-14 DIAGNOSIS — J449 Chronic obstructive pulmonary disease, unspecified: Secondary | ICD-10-CM | POA: Diagnosis not present

## 2020-04-15 ENCOUNTER — Ambulatory Visit: Payer: Medicare Other | Admitting: Physician Assistant

## 2020-04-15 DIAGNOSIS — Z7951 Long term (current) use of inhaled steroids: Secondary | ICD-10-CM | POA: Diagnosis not present

## 2020-04-15 DIAGNOSIS — Z9181 History of falling: Secondary | ICD-10-CM | POA: Diagnosis not present

## 2020-04-15 DIAGNOSIS — M9702XD Periprosthetic fracture around internal prosthetic left hip joint, subsequent encounter: Secondary | ICD-10-CM | POA: Diagnosis not present

## 2020-04-15 DIAGNOSIS — M069 Rheumatoid arthritis, unspecified: Secondary | ICD-10-CM | POA: Diagnosis not present

## 2020-04-15 DIAGNOSIS — K5909 Other constipation: Secondary | ICD-10-CM | POA: Diagnosis not present

## 2020-04-15 DIAGNOSIS — M1990 Unspecified osteoarthritis, unspecified site: Secondary | ICD-10-CM | POA: Diagnosis not present

## 2020-04-15 DIAGNOSIS — K2270 Barrett's esophagus without dysplasia: Secondary | ICD-10-CM | POA: Diagnosis not present

## 2020-04-15 DIAGNOSIS — M80052D Age-related osteoporosis with current pathological fracture, left femur, subsequent encounter for fracture with routine healing: Secondary | ICD-10-CM | POA: Diagnosis not present

## 2020-04-15 DIAGNOSIS — G43919 Migraine, unspecified, intractable, without status migrainosus: Secondary | ICD-10-CM | POA: Diagnosis not present

## 2020-04-15 DIAGNOSIS — N200 Calculus of kidney: Secondary | ICD-10-CM | POA: Diagnosis not present

## 2020-04-15 DIAGNOSIS — G2581 Restless legs syndrome: Secondary | ICD-10-CM | POA: Diagnosis not present

## 2020-04-15 DIAGNOSIS — E785 Hyperlipidemia, unspecified: Secondary | ICD-10-CM | POA: Diagnosis not present

## 2020-04-15 DIAGNOSIS — I10 Essential (primary) hypertension: Secondary | ICD-10-CM | POA: Diagnosis not present

## 2020-04-15 DIAGNOSIS — E1142 Type 2 diabetes mellitus with diabetic polyneuropathy: Secondary | ICD-10-CM | POA: Diagnosis not present

## 2020-04-15 DIAGNOSIS — G4700 Insomnia, unspecified: Secondary | ICD-10-CM | POA: Diagnosis not present

## 2020-04-15 DIAGNOSIS — E1165 Type 2 diabetes mellitus with hyperglycemia: Secondary | ICD-10-CM | POA: Diagnosis not present

## 2020-04-15 DIAGNOSIS — J449 Chronic obstructive pulmonary disease, unspecified: Secondary | ICD-10-CM | POA: Diagnosis not present

## 2020-04-15 DIAGNOSIS — Z96653 Presence of artificial knee joint, bilateral: Secondary | ICD-10-CM | POA: Diagnosis not present

## 2020-04-16 ENCOUNTER — Telehealth: Payer: Self-pay

## 2020-04-16 DIAGNOSIS — G43919 Migraine, unspecified, intractable, without status migrainosus: Secondary | ICD-10-CM | POA: Diagnosis not present

## 2020-04-16 DIAGNOSIS — K5909 Other constipation: Secondary | ICD-10-CM | POA: Diagnosis not present

## 2020-04-16 DIAGNOSIS — Z96653 Presence of artificial knee joint, bilateral: Secondary | ICD-10-CM | POA: Diagnosis not present

## 2020-04-16 DIAGNOSIS — K2270 Barrett's esophagus without dysplasia: Secondary | ICD-10-CM | POA: Diagnosis not present

## 2020-04-16 DIAGNOSIS — Z9181 History of falling: Secondary | ICD-10-CM | POA: Diagnosis not present

## 2020-04-16 DIAGNOSIS — I10 Essential (primary) hypertension: Secondary | ICD-10-CM | POA: Diagnosis not present

## 2020-04-16 DIAGNOSIS — N200 Calculus of kidney: Secondary | ICD-10-CM | POA: Diagnosis not present

## 2020-04-16 DIAGNOSIS — G4700 Insomnia, unspecified: Secondary | ICD-10-CM | POA: Diagnosis not present

## 2020-04-16 DIAGNOSIS — M1990 Unspecified osteoarthritis, unspecified site: Secondary | ICD-10-CM | POA: Diagnosis not present

## 2020-04-16 DIAGNOSIS — E1142 Type 2 diabetes mellitus with diabetic polyneuropathy: Secondary | ICD-10-CM | POA: Diagnosis not present

## 2020-04-16 DIAGNOSIS — M9702XD Periprosthetic fracture around internal prosthetic left hip joint, subsequent encounter: Secondary | ICD-10-CM | POA: Diagnosis not present

## 2020-04-16 DIAGNOSIS — G2581 Restless legs syndrome: Secondary | ICD-10-CM | POA: Diagnosis not present

## 2020-04-16 DIAGNOSIS — M80052D Age-related osteoporosis with current pathological fracture, left femur, subsequent encounter for fracture with routine healing: Secondary | ICD-10-CM | POA: Diagnosis not present

## 2020-04-16 DIAGNOSIS — E785 Hyperlipidemia, unspecified: Secondary | ICD-10-CM | POA: Diagnosis not present

## 2020-04-16 DIAGNOSIS — M069 Rheumatoid arthritis, unspecified: Secondary | ICD-10-CM | POA: Diagnosis not present

## 2020-04-16 DIAGNOSIS — E1165 Type 2 diabetes mellitus with hyperglycemia: Secondary | ICD-10-CM | POA: Diagnosis not present

## 2020-04-16 DIAGNOSIS — J449 Chronic obstructive pulmonary disease, unspecified: Secondary | ICD-10-CM | POA: Diagnosis not present

## 2020-04-16 DIAGNOSIS — Z7951 Long term (current) use of inhaled steroids: Secondary | ICD-10-CM | POA: Diagnosis not present

## 2020-04-16 NOTE — Telephone Encounter (Signed)
[facility]ave verbal [facility]  [id number] therapy  1 times a week for 4 weeks

## 2020-04-17 DIAGNOSIS — E1142 Type 2 diabetes mellitus with diabetic polyneuropathy: Secondary | ICD-10-CM | POA: Diagnosis not present

## 2020-04-17 DIAGNOSIS — K5909 Other constipation: Secondary | ICD-10-CM | POA: Diagnosis not present

## 2020-04-17 DIAGNOSIS — Z7951 Long term (current) use of inhaled steroids: Secondary | ICD-10-CM | POA: Diagnosis not present

## 2020-04-17 DIAGNOSIS — M80052D Age-related osteoporosis with current pathological fracture, left femur, subsequent encounter for fracture with routine healing: Secondary | ICD-10-CM | POA: Diagnosis not present

## 2020-04-17 DIAGNOSIS — G4700 Insomnia, unspecified: Secondary | ICD-10-CM | POA: Diagnosis not present

## 2020-04-17 DIAGNOSIS — Z9181 History of falling: Secondary | ICD-10-CM | POA: Diagnosis not present

## 2020-04-17 DIAGNOSIS — M1990 Unspecified osteoarthritis, unspecified site: Secondary | ICD-10-CM | POA: Diagnosis not present

## 2020-04-17 DIAGNOSIS — Z96653 Presence of artificial knee joint, bilateral: Secondary | ICD-10-CM | POA: Diagnosis not present

## 2020-04-17 DIAGNOSIS — I10 Essential (primary) hypertension: Secondary | ICD-10-CM | POA: Diagnosis not present

## 2020-04-17 DIAGNOSIS — E785 Hyperlipidemia, unspecified: Secondary | ICD-10-CM | POA: Diagnosis not present

## 2020-04-17 DIAGNOSIS — M069 Rheumatoid arthritis, unspecified: Secondary | ICD-10-CM | POA: Diagnosis not present

## 2020-04-17 DIAGNOSIS — G43919 Migraine, unspecified, intractable, without status migrainosus: Secondary | ICD-10-CM | POA: Diagnosis not present

## 2020-04-17 DIAGNOSIS — M9702XD Periprosthetic fracture around internal prosthetic left hip joint, subsequent encounter: Secondary | ICD-10-CM | POA: Diagnosis not present

## 2020-04-17 DIAGNOSIS — J449 Chronic obstructive pulmonary disease, unspecified: Secondary | ICD-10-CM | POA: Diagnosis not present

## 2020-04-17 DIAGNOSIS — E1165 Type 2 diabetes mellitus with hyperglycemia: Secondary | ICD-10-CM | POA: Diagnosis not present

## 2020-04-17 DIAGNOSIS — K2270 Barrett's esophagus without dysplasia: Secondary | ICD-10-CM | POA: Diagnosis not present

## 2020-04-17 DIAGNOSIS — N200 Calculus of kidney: Secondary | ICD-10-CM | POA: Diagnosis not present

## 2020-04-17 DIAGNOSIS — G2581 Restless legs syndrome: Secondary | ICD-10-CM | POA: Diagnosis not present

## 2020-04-18 DIAGNOSIS — M978XXA Periprosthetic fracture around other internal prosthetic joint, initial encounter: Secondary | ICD-10-CM | POA: Diagnosis not present

## 2020-04-18 DIAGNOSIS — Z96659 Presence of unspecified artificial knee joint: Secondary | ICD-10-CM | POA: Diagnosis not present

## 2020-04-19 DIAGNOSIS — E1165 Type 2 diabetes mellitus with hyperglycemia: Secondary | ICD-10-CM | POA: Diagnosis not present

## 2020-04-19 DIAGNOSIS — G2581 Restless legs syndrome: Secondary | ICD-10-CM | POA: Diagnosis not present

## 2020-04-19 DIAGNOSIS — Z7951 Long term (current) use of inhaled steroids: Secondary | ICD-10-CM | POA: Diagnosis not present

## 2020-04-19 DIAGNOSIS — J449 Chronic obstructive pulmonary disease, unspecified: Secondary | ICD-10-CM | POA: Diagnosis not present

## 2020-04-19 DIAGNOSIS — G43919 Migraine, unspecified, intractable, without status migrainosus: Secondary | ICD-10-CM | POA: Diagnosis not present

## 2020-04-19 DIAGNOSIS — M9702XD Periprosthetic fracture around internal prosthetic left hip joint, subsequent encounter: Secondary | ICD-10-CM | POA: Diagnosis not present

## 2020-04-19 DIAGNOSIS — I10 Essential (primary) hypertension: Secondary | ICD-10-CM | POA: Diagnosis not present

## 2020-04-19 DIAGNOSIS — Z9181 History of falling: Secondary | ICD-10-CM | POA: Diagnosis not present

## 2020-04-19 DIAGNOSIS — N200 Calculus of kidney: Secondary | ICD-10-CM | POA: Diagnosis not present

## 2020-04-19 DIAGNOSIS — E785 Hyperlipidemia, unspecified: Secondary | ICD-10-CM | POA: Diagnosis not present

## 2020-04-19 DIAGNOSIS — M80052D Age-related osteoporosis with current pathological fracture, left femur, subsequent encounter for fracture with routine healing: Secondary | ICD-10-CM | POA: Diagnosis not present

## 2020-04-19 DIAGNOSIS — K5909 Other constipation: Secondary | ICD-10-CM | POA: Diagnosis not present

## 2020-04-19 DIAGNOSIS — E1142 Type 2 diabetes mellitus with diabetic polyneuropathy: Secondary | ICD-10-CM | POA: Diagnosis not present

## 2020-04-19 DIAGNOSIS — M1990 Unspecified osteoarthritis, unspecified site: Secondary | ICD-10-CM | POA: Diagnosis not present

## 2020-04-19 DIAGNOSIS — K2270 Barrett's esophagus without dysplasia: Secondary | ICD-10-CM | POA: Diagnosis not present

## 2020-04-19 DIAGNOSIS — M069 Rheumatoid arthritis, unspecified: Secondary | ICD-10-CM | POA: Diagnosis not present

## 2020-04-19 DIAGNOSIS — Z96653 Presence of artificial knee joint, bilateral: Secondary | ICD-10-CM | POA: Diagnosis not present

## 2020-04-19 DIAGNOSIS — G4700 Insomnia, unspecified: Secondary | ICD-10-CM | POA: Diagnosis not present

## 2020-04-22 ENCOUNTER — Other Ambulatory Visit: Payer: Self-pay

## 2020-04-22 DIAGNOSIS — M9702XD Periprosthetic fracture around internal prosthetic left hip joint, subsequent encounter: Secondary | ICD-10-CM | POA: Diagnosis not present

## 2020-04-22 DIAGNOSIS — J449 Chronic obstructive pulmonary disease, unspecified: Secondary | ICD-10-CM | POA: Diagnosis not present

## 2020-04-22 DIAGNOSIS — I10 Essential (primary) hypertension: Secondary | ICD-10-CM

## 2020-04-22 DIAGNOSIS — K2270 Barrett's esophagus without dysplasia: Secondary | ICD-10-CM | POA: Diagnosis not present

## 2020-04-22 DIAGNOSIS — E1142 Type 2 diabetes mellitus with diabetic polyneuropathy: Secondary | ICD-10-CM | POA: Diagnosis not present

## 2020-04-22 DIAGNOSIS — G43919 Migraine, unspecified, intractable, without status migrainosus: Secondary | ICD-10-CM | POA: Diagnosis not present

## 2020-04-22 DIAGNOSIS — Z7951 Long term (current) use of inhaled steroids: Secondary | ICD-10-CM | POA: Diagnosis not present

## 2020-04-22 DIAGNOSIS — K5909 Other constipation: Secondary | ICD-10-CM | POA: Diagnosis not present

## 2020-04-22 DIAGNOSIS — Z96653 Presence of artificial knee joint, bilateral: Secondary | ICD-10-CM | POA: Diagnosis not present

## 2020-04-22 DIAGNOSIS — G2581 Restless legs syndrome: Secondary | ICD-10-CM | POA: Diagnosis not present

## 2020-04-22 DIAGNOSIS — G4700 Insomnia, unspecified: Secondary | ICD-10-CM | POA: Diagnosis not present

## 2020-04-22 DIAGNOSIS — M069 Rheumatoid arthritis, unspecified: Secondary | ICD-10-CM | POA: Diagnosis not present

## 2020-04-22 DIAGNOSIS — Z9181 History of falling: Secondary | ICD-10-CM | POA: Diagnosis not present

## 2020-04-22 DIAGNOSIS — E1165 Type 2 diabetes mellitus with hyperglycemia: Secondary | ICD-10-CM | POA: Diagnosis not present

## 2020-04-22 DIAGNOSIS — N200 Calculus of kidney: Secondary | ICD-10-CM | POA: Diagnosis not present

## 2020-04-22 DIAGNOSIS — E785 Hyperlipidemia, unspecified: Secondary | ICD-10-CM | POA: Diagnosis not present

## 2020-04-22 DIAGNOSIS — M1990 Unspecified osteoarthritis, unspecified site: Secondary | ICD-10-CM | POA: Diagnosis not present

## 2020-04-22 DIAGNOSIS — M80052D Age-related osteoporosis with current pathological fracture, left femur, subsequent encounter for fracture with routine healing: Secondary | ICD-10-CM | POA: Diagnosis not present

## 2020-04-22 MED ORDER — DILTIAZEM HCL ER 180 MG PO CP24: 180.0000 mg | ORAL_CAPSULE | Freq: Every day | ORAL | 1 refills | Status: DC

## 2020-04-23 DIAGNOSIS — J449 Chronic obstructive pulmonary disease, unspecified: Secondary | ICD-10-CM | POA: Diagnosis not present

## 2020-04-23 DIAGNOSIS — E1165 Type 2 diabetes mellitus with hyperglycemia: Secondary | ICD-10-CM | POA: Diagnosis not present

## 2020-04-23 DIAGNOSIS — M069 Rheumatoid arthritis, unspecified: Secondary | ICD-10-CM | POA: Diagnosis not present

## 2020-04-23 DIAGNOSIS — M1990 Unspecified osteoarthritis, unspecified site: Secondary | ICD-10-CM | POA: Diagnosis not present

## 2020-04-23 DIAGNOSIS — G4700 Insomnia, unspecified: Secondary | ICD-10-CM | POA: Diagnosis not present

## 2020-04-23 DIAGNOSIS — K2270 Barrett's esophagus without dysplasia: Secondary | ICD-10-CM | POA: Diagnosis not present

## 2020-04-23 DIAGNOSIS — M80052D Age-related osteoporosis with current pathological fracture, left femur, subsequent encounter for fracture with routine healing: Secondary | ICD-10-CM | POA: Diagnosis not present

## 2020-04-23 DIAGNOSIS — Z7951 Long term (current) use of inhaled steroids: Secondary | ICD-10-CM | POA: Diagnosis not present

## 2020-04-23 DIAGNOSIS — I10 Essential (primary) hypertension: Secondary | ICD-10-CM | POA: Diagnosis not present

## 2020-04-23 DIAGNOSIS — G43919 Migraine, unspecified, intractable, without status migrainosus: Secondary | ICD-10-CM | POA: Diagnosis not present

## 2020-04-23 DIAGNOSIS — Z96653 Presence of artificial knee joint, bilateral: Secondary | ICD-10-CM | POA: Diagnosis not present

## 2020-04-23 DIAGNOSIS — G2581 Restless legs syndrome: Secondary | ICD-10-CM | POA: Diagnosis not present

## 2020-04-23 DIAGNOSIS — E785 Hyperlipidemia, unspecified: Secondary | ICD-10-CM | POA: Diagnosis not present

## 2020-04-23 DIAGNOSIS — K5909 Other constipation: Secondary | ICD-10-CM | POA: Diagnosis not present

## 2020-04-23 DIAGNOSIS — M9702XD Periprosthetic fracture around internal prosthetic left hip joint, subsequent encounter: Secondary | ICD-10-CM | POA: Diagnosis not present

## 2020-04-23 DIAGNOSIS — Z9181 History of falling: Secondary | ICD-10-CM | POA: Diagnosis not present

## 2020-04-23 DIAGNOSIS — E1142 Type 2 diabetes mellitus with diabetic polyneuropathy: Secondary | ICD-10-CM | POA: Diagnosis not present

## 2020-04-23 DIAGNOSIS — N200 Calculus of kidney: Secondary | ICD-10-CM | POA: Diagnosis not present

## 2020-04-25 DIAGNOSIS — K5909 Other constipation: Secondary | ICD-10-CM | POA: Diagnosis not present

## 2020-04-25 DIAGNOSIS — M9702XD Periprosthetic fracture around internal prosthetic left hip joint, subsequent encounter: Secondary | ICD-10-CM | POA: Diagnosis not present

## 2020-04-25 DIAGNOSIS — M069 Rheumatoid arthritis, unspecified: Secondary | ICD-10-CM | POA: Diagnosis not present

## 2020-04-25 DIAGNOSIS — G4700 Insomnia, unspecified: Secondary | ICD-10-CM | POA: Diagnosis not present

## 2020-04-25 DIAGNOSIS — N200 Calculus of kidney: Secondary | ICD-10-CM | POA: Diagnosis not present

## 2020-04-25 DIAGNOSIS — M1990 Unspecified osteoarthritis, unspecified site: Secondary | ICD-10-CM | POA: Diagnosis not present

## 2020-04-25 DIAGNOSIS — I10 Essential (primary) hypertension: Secondary | ICD-10-CM | POA: Diagnosis not present

## 2020-04-25 DIAGNOSIS — K2270 Barrett's esophagus without dysplasia: Secondary | ICD-10-CM | POA: Diagnosis not present

## 2020-04-25 DIAGNOSIS — G43919 Migraine, unspecified, intractable, without status migrainosus: Secondary | ICD-10-CM | POA: Diagnosis not present

## 2020-04-25 DIAGNOSIS — E785 Hyperlipidemia, unspecified: Secondary | ICD-10-CM | POA: Diagnosis not present

## 2020-04-25 DIAGNOSIS — E1142 Type 2 diabetes mellitus with diabetic polyneuropathy: Secondary | ICD-10-CM | POA: Diagnosis not present

## 2020-04-25 DIAGNOSIS — Z7951 Long term (current) use of inhaled steroids: Secondary | ICD-10-CM | POA: Diagnosis not present

## 2020-04-25 DIAGNOSIS — M80052D Age-related osteoporosis with current pathological fracture, left femur, subsequent encounter for fracture with routine healing: Secondary | ICD-10-CM | POA: Diagnosis not present

## 2020-04-25 DIAGNOSIS — Z96653 Presence of artificial knee joint, bilateral: Secondary | ICD-10-CM | POA: Diagnosis not present

## 2020-04-25 DIAGNOSIS — E1165 Type 2 diabetes mellitus with hyperglycemia: Secondary | ICD-10-CM | POA: Diagnosis not present

## 2020-04-25 DIAGNOSIS — G2581 Restless legs syndrome: Secondary | ICD-10-CM | POA: Diagnosis not present

## 2020-04-25 DIAGNOSIS — Z9181 History of falling: Secondary | ICD-10-CM | POA: Diagnosis not present

## 2020-04-25 DIAGNOSIS — J449 Chronic obstructive pulmonary disease, unspecified: Secondary | ICD-10-CM | POA: Diagnosis not present

## 2020-04-26 DIAGNOSIS — G2581 Restless legs syndrome: Secondary | ICD-10-CM | POA: Diagnosis not present

## 2020-04-26 DIAGNOSIS — E785 Hyperlipidemia, unspecified: Secondary | ICD-10-CM | POA: Diagnosis not present

## 2020-04-26 DIAGNOSIS — M069 Rheumatoid arthritis, unspecified: Secondary | ICD-10-CM | POA: Diagnosis not present

## 2020-04-26 DIAGNOSIS — N200 Calculus of kidney: Secondary | ICD-10-CM | POA: Diagnosis not present

## 2020-04-26 DIAGNOSIS — Z7951 Long term (current) use of inhaled steroids: Secondary | ICD-10-CM | POA: Diagnosis not present

## 2020-04-26 DIAGNOSIS — E1142 Type 2 diabetes mellitus with diabetic polyneuropathy: Secondary | ICD-10-CM | POA: Diagnosis not present

## 2020-04-26 DIAGNOSIS — G43919 Migraine, unspecified, intractable, without status migrainosus: Secondary | ICD-10-CM | POA: Diagnosis not present

## 2020-04-26 DIAGNOSIS — G4700 Insomnia, unspecified: Secondary | ICD-10-CM | POA: Diagnosis not present

## 2020-04-26 DIAGNOSIS — M1990 Unspecified osteoarthritis, unspecified site: Secondary | ICD-10-CM | POA: Diagnosis not present

## 2020-04-26 DIAGNOSIS — Z9181 History of falling: Secondary | ICD-10-CM | POA: Diagnosis not present

## 2020-04-26 DIAGNOSIS — K2270 Barrett's esophagus without dysplasia: Secondary | ICD-10-CM | POA: Diagnosis not present

## 2020-04-26 DIAGNOSIS — M80052D Age-related osteoporosis with current pathological fracture, left femur, subsequent encounter for fracture with routine healing: Secondary | ICD-10-CM | POA: Diagnosis not present

## 2020-04-26 DIAGNOSIS — K5909 Other constipation: Secondary | ICD-10-CM | POA: Diagnosis not present

## 2020-04-26 DIAGNOSIS — J449 Chronic obstructive pulmonary disease, unspecified: Secondary | ICD-10-CM | POA: Diagnosis not present

## 2020-04-26 DIAGNOSIS — E1165 Type 2 diabetes mellitus with hyperglycemia: Secondary | ICD-10-CM | POA: Diagnosis not present

## 2020-04-26 DIAGNOSIS — Z96653 Presence of artificial knee joint, bilateral: Secondary | ICD-10-CM | POA: Diagnosis not present

## 2020-04-26 DIAGNOSIS — I10 Essential (primary) hypertension: Secondary | ICD-10-CM | POA: Diagnosis not present

## 2020-04-26 DIAGNOSIS — M9702XD Periprosthetic fracture around internal prosthetic left hip joint, subsequent encounter: Secondary | ICD-10-CM | POA: Diagnosis not present

## 2020-04-29 DIAGNOSIS — E785 Hyperlipidemia, unspecified: Secondary | ICD-10-CM | POA: Diagnosis not present

## 2020-04-29 DIAGNOSIS — I10 Essential (primary) hypertension: Secondary | ICD-10-CM | POA: Diagnosis not present

## 2020-04-29 DIAGNOSIS — Z7951 Long term (current) use of inhaled steroids: Secondary | ICD-10-CM | POA: Diagnosis not present

## 2020-04-29 DIAGNOSIS — N200 Calculus of kidney: Secondary | ICD-10-CM | POA: Diagnosis not present

## 2020-04-29 DIAGNOSIS — Z9181 History of falling: Secondary | ICD-10-CM | POA: Diagnosis not present

## 2020-04-29 DIAGNOSIS — E1165 Type 2 diabetes mellitus with hyperglycemia: Secondary | ICD-10-CM | POA: Diagnosis not present

## 2020-04-29 DIAGNOSIS — G4700 Insomnia, unspecified: Secondary | ICD-10-CM | POA: Diagnosis not present

## 2020-04-29 DIAGNOSIS — Z96653 Presence of artificial knee joint, bilateral: Secondary | ICD-10-CM | POA: Diagnosis not present

## 2020-04-29 DIAGNOSIS — J449 Chronic obstructive pulmonary disease, unspecified: Secondary | ICD-10-CM | POA: Diagnosis not present

## 2020-04-29 DIAGNOSIS — M1990 Unspecified osteoarthritis, unspecified site: Secondary | ICD-10-CM | POA: Diagnosis not present

## 2020-04-29 DIAGNOSIS — K5909 Other constipation: Secondary | ICD-10-CM | POA: Diagnosis not present

## 2020-04-29 DIAGNOSIS — E1142 Type 2 diabetes mellitus with diabetic polyneuropathy: Secondary | ICD-10-CM | POA: Diagnosis not present

## 2020-04-29 DIAGNOSIS — G43919 Migraine, unspecified, intractable, without status migrainosus: Secondary | ICD-10-CM | POA: Diagnosis not present

## 2020-04-29 DIAGNOSIS — M069 Rheumatoid arthritis, unspecified: Secondary | ICD-10-CM | POA: Diagnosis not present

## 2020-04-29 DIAGNOSIS — M9702XD Periprosthetic fracture around internal prosthetic left hip joint, subsequent encounter: Secondary | ICD-10-CM | POA: Diagnosis not present

## 2020-04-29 DIAGNOSIS — G2581 Restless legs syndrome: Secondary | ICD-10-CM | POA: Diagnosis not present

## 2020-04-29 DIAGNOSIS — M80052D Age-related osteoporosis with current pathological fracture, left femur, subsequent encounter for fracture with routine healing: Secondary | ICD-10-CM | POA: Diagnosis not present

## 2020-04-29 DIAGNOSIS — K2270 Barrett's esophagus without dysplasia: Secondary | ICD-10-CM | POA: Diagnosis not present

## 2020-04-30 ENCOUNTER — Ambulatory Visit (INDEPENDENT_AMBULATORY_CARE_PROVIDER_SITE_OTHER): Payer: Medicaid Other | Admitting: Internal Medicine

## 2020-04-30 ENCOUNTER — Encounter: Payer: Self-pay | Admitting: Internal Medicine

## 2020-04-30 VITALS — BP 152/92 | HR 97 | Temp 98.1°F | Resp 16 | Ht 63.0 in | Wt 162.4 lb

## 2020-04-30 DIAGNOSIS — K2270 Barrett's esophagus without dysplasia: Secondary | ICD-10-CM | POA: Diagnosis not present

## 2020-04-30 DIAGNOSIS — J301 Allergic rhinitis due to pollen: Secondary | ICD-10-CM | POA: Diagnosis not present

## 2020-04-30 DIAGNOSIS — E1165 Type 2 diabetes mellitus with hyperglycemia: Secondary | ICD-10-CM | POA: Diagnosis not present

## 2020-04-30 DIAGNOSIS — K219 Gastro-esophageal reflux disease without esophagitis: Secondary | ICD-10-CM | POA: Diagnosis not present

## 2020-04-30 DIAGNOSIS — E1142 Type 2 diabetes mellitus with diabetic polyneuropathy: Secondary | ICD-10-CM | POA: Diagnosis not present

## 2020-04-30 DIAGNOSIS — Z96653 Presence of artificial knee joint, bilateral: Secondary | ICD-10-CM | POA: Diagnosis not present

## 2020-04-30 DIAGNOSIS — K5909 Other constipation: Secondary | ICD-10-CM | POA: Diagnosis not present

## 2020-04-30 DIAGNOSIS — J45909 Unspecified asthma, uncomplicated: Secondary | ICD-10-CM

## 2020-04-30 DIAGNOSIS — M9702XD Periprosthetic fracture around internal prosthetic left hip joint, subsequent encounter: Secondary | ICD-10-CM | POA: Diagnosis not present

## 2020-04-30 DIAGNOSIS — Z7951 Long term (current) use of inhaled steroids: Secondary | ICD-10-CM | POA: Diagnosis not present

## 2020-04-30 DIAGNOSIS — Z9181 History of falling: Secondary | ICD-10-CM | POA: Diagnosis not present

## 2020-04-30 DIAGNOSIS — G43919 Migraine, unspecified, intractable, without status migrainosus: Secondary | ICD-10-CM | POA: Diagnosis not present

## 2020-04-30 DIAGNOSIS — M80052D Age-related osteoporosis with current pathological fracture, left femur, subsequent encounter for fracture with routine healing: Secondary | ICD-10-CM | POA: Diagnosis not present

## 2020-04-30 DIAGNOSIS — J449 Chronic obstructive pulmonary disease, unspecified: Secondary | ICD-10-CM | POA: Diagnosis not present

## 2020-04-30 DIAGNOSIS — M1990 Unspecified osteoarthritis, unspecified site: Secondary | ICD-10-CM | POA: Diagnosis not present

## 2020-04-30 DIAGNOSIS — G2581 Restless legs syndrome: Secondary | ICD-10-CM | POA: Diagnosis not present

## 2020-04-30 DIAGNOSIS — N200 Calculus of kidney: Secondary | ICD-10-CM | POA: Diagnosis not present

## 2020-04-30 DIAGNOSIS — G4700 Insomnia, unspecified: Secondary | ICD-10-CM | POA: Diagnosis not present

## 2020-04-30 DIAGNOSIS — E785 Hyperlipidemia, unspecified: Secondary | ICD-10-CM | POA: Diagnosis not present

## 2020-04-30 DIAGNOSIS — I10 Essential (primary) hypertension: Secondary | ICD-10-CM | POA: Diagnosis not present

## 2020-04-30 DIAGNOSIS — M069 Rheumatoid arthritis, unspecified: Secondary | ICD-10-CM | POA: Diagnosis not present

## 2020-04-30 MED ORDER — LORATADINE 10 MG PO TABS: 10.0000 mg | ORAL_TABLET | Freq: Every day | ORAL | 11 refills | Status: DC

## 2020-04-30 NOTE — Patient Instructions (Signed)
Asthma, Adult  Asthma is a long-term (chronic) condition in which the airways get tight and narrow. The airways are the breathing passages that lead from the nose and mouth down into the lungs. A person with asthma will have times when symptoms get worse. These are called asthma attacks. They can cause coughing, whistling sounds when you breathe (wheezing), shortness of breath, and chest pain. They can make it hard to breathe. There is no cure for asthma, but medicines and lifestyle changes can help control it. There are many things that can bring on an asthma attack or make asthma symptoms worse (triggers). Common triggers include:  Mold.  Dust.  Cigarette smoke.  Cockroaches.  Things that can cause allergy symptoms (allergens). These include animal skin flakes (dander) and pollen from trees or grass.  Things that pollute the air. These may include household cleaners, wood smoke, smog, or chemical odors.  Cold air, weather changes, and wind.  Crying or laughing hard.  Stress.  Certain medicines or drugs.  Certain foods such as dried fruit, potato chips, and grape juice.  Infections, such as a cold or the flu.  Certain medical conditions or diseases.  Exercise or tiring activities. Asthma may be treated with medicines and by staying away from the things that cause asthma attacks. Types of medicines may include:  Controller medicines. These help prevent asthma symptoms. They are usually taken every day.  Fast-acting reliever or rescue medicines. These quickly relieve asthma symptoms. They are used as needed and provide short-term relief.  Allergy medicines if your attacks are brought on by allergens.  Medicines to help control the body's defense (immune) system. Follow these instructions at home: Avoiding triggers in your home  Change your heating and air conditioning filter often.  Limit your use of fireplaces and wood stoves.  Get rid of pests (such as roaches and  mice) and their droppings.  Throw away plants if you see mold on them.  Clean your floors. Dust regularly. Use cleaning products that do not smell.  Have someone vacuum when you are not home. Use a vacuum cleaner with a HEPA filter if possible.  Replace carpet with wood, tile, or vinyl flooring. Carpet can trap animal skin flakes and dust.  Use allergy-proof pillows, mattress covers, and box spring covers.  Wash bed sheets and blankets every week in hot water. Dry them in a dryer.  Keep your bedroom free of any triggers.  Avoid pets and keep windows closed when things that cause allergy symptoms are in the air.  Use blankets that are made of polyester or cotton.  Clean bathrooms and kitchens with bleach. If possible, have someone repaint the walls in these rooms with mold-resistant paint. Keep out of the rooms that are being cleaned and painted.  Wash your hands often with soap and water. If soap and water are not available, use hand sanitizer.  Do not allow anyone to smoke in your home. General instructions  Take over-the-counter and prescription medicines only as told by your doctor. ? Talk with your doctor if you have questions about how or when to take your medicines. ? Make note if you need to use your medicines more often than usual.  Do not use any products that contain nicotine or tobacco, such as cigarettes and e-cigarettes. If you need help quitting, ask your doctor.  Stay away from secondhand smoke.  Avoid doing things outdoors when allergen counts are high and when air quality is low.  Wear a ski mask   when doing outdoor activities in the winter. The mask should cover your nose and mouth. Exercise indoors on cold days if you can.  Warm up before you exercise. Take time to cool down after exercise.  Use a peak flow meter as told by your doctor. A peak flow meter is a tool that measures how well the lungs are working.  Keep track of the peak flow meter's readings.  Write them down.  Follow your asthma action plan. This is a written plan for taking care of your asthma and treating your attacks.  Make sure you get all the shots (vaccines) that your doctor recommends. Ask your doctor about a flu shot and a pneumonia shot.  Keep all follow-up visits as told by your doctor. This is important. Contact a doctor if:  You have wheezing, shortness of breath, or a cough even while taking medicine to prevent attacks.  The mucus you cough up (sputum) is thicker than usual.  The mucus you cough up changes from clear or white to yellow, green, gray, or bloody.  You have problems from the medicine you are taking, such as: ? A rash. ? Itching. ? Swelling. ? Trouble breathing.  You need reliever medicines more than 2-3 times a week.  Your peak flow reading is still at 50-79% of your personal best after following the action plan for 1 hour.  You have a fever. Get help right away if:  You seem to be worse and are not responding to medicine during an asthma attack.  You are short of breath even at rest.  You get short of breath when doing very little activity.  You have trouble eating, drinking, or talking.  You have chest pain or tightness.  You have a fast heartbeat.  Your lips or fingernails start to turn blue.  You are light-headed or dizzy, or you faint.  Your peak flow is less than 50% of your personal best.  You feel too tired to breathe normally. Summary  Asthma is a long-term (chronic) condition in which the airways get tight and narrow. An asthma attack can make it hard to breathe.  Asthma cannot be cured, but medicines and lifestyle changes can help control it.  Make sure you understand how to avoid triggers and how and when to use your medicines. This information is not intended to replace advice given to you by your health care provider. Make sure you discuss any questions you have with your health care provider. Document Revised:  07/12/2019 Document Reviewed: 07/12/2019 Elsevier Patient Education  2021 [employer]   

## 2020-04-30 NOTE — Progress Notes (Signed)
[facility] [place], [place] [address]  Pulmonary Sleep Medicine   Office Visit Note  Patient Name: Summer Murphy DOB: [birth year] MRN [id number]  Date of Service: 04/30/2020  Complaints/HPI: Asthma GERD she is being followed up for chronic obstructive asthma.  She currently is on antihistamines for rhinitis also.  In addition which she does take Breo at.  Proper use of inhalers also discussed at length with her too.  No recent admission to the hospital.  ROS  General: (-) fever, (-) chills, (-) night sweats, (-) weakness Skin: (-) rashes, (-) itching,. Eyes: (-) visual changes, (-) redness, (-) itching. Nose and Sinuses: (-) nasal stuffiness or itchiness, (-) postnasal drip, (-) nosebleeds, (-) sinus trouble. Mouth and Throat: (-) sore throat, (-) hoarseness. Neck: (-) swollen glands, (-) enlarged thyroid, (-) neck pain. Respiratory: - cough, (-) bloody sputum, - shortness of breath, - wheezing. Cardiovascular: - ankle swelling, (-) chest pain. Lymphatic: (-) lymph node enlargement. Neurologic: (-) numbness, (-) tingling. Psychiatric: (-) anxiety, (-) depression   Current Medication: Outpatient Encounter Medications as of 04/30/2020  Medication Sig  . Biotin 800 MCG TABS Take 1 tablet (800 mcg total) by mouth daily.  . Brexpiprazole 4 MG TABS Take 4 mg by mouth daily.   . busPIRone (BUSPAR) 10 MG tablet Take 10 mg by mouth 2 (two) times daily.   . cetirizine (ZYRTEC) 10 MG tablet Take 1 tablet (10 mg total) by mouth daily.  . diclofenac sodium (VOLTAREN) 1 % GEL Apply 2 g topically in the morning, at noon, and at bedtime.  Marland Kitchen diltiazem (DILACOR XR) 180 MG 24 hr capsule Take 1 capsule (180 mg total) by mouth daily.  . diphenhydrAMINE (BENADRYL) 25 MG tablet Take 50 mg by mouth 2 (two) times daily as needed.   . diphenoxylate-atropine (LOMOTIL) 2.5-0.025 MG tablet Take 1 tablet by mouth 3 (three) times daily as needed for diarrhea or loose stools.  .  docusate sodium (COLACE) 100 MG capsule Take 1 capsule (100 mg total) by mouth 2 (two) times daily.  . DULoxetine (CYMBALTA) 60 MG capsule Take 60 mg by mouth daily.  . fluticasone furoate-vilanterol (BREO ELLIPTA) 200-25 MCG/INH AEPB Inhale 1 puff into the lungs daily.  Marland Kitchen gabapentin (NEURONTIN) 600 MG tablet Take 1 tablet (600 mg total) by mouth 2 (two) times daily.  . Ipratropium-Albuterol (COMBIVENT RESPIMAT) 20-100 MCG/ACT AERS respimat INHALE 1 PUFF INTO THE LUNGS EVERY 6 HOURS  . ipratropium-albuterol (DUONEB) 0.5-2.5 (3) MG/3ML SOLN Take 3 mLs by nebulization every 4 (four) hours as needed (for shortness of breath/wheezing).  Marland Kitchen ketoconazole (NIZORAL) 2 % cream Apply 1 application topically daily. (Patient taking differently: Apply 1 application topically daily as needed for irritation.)  . LORazepam (ATIVAN) 1 MG tablet Take 1 mg by mouth at bedtime as needed for sleep.   . metoprolol tartrate (LOPRESSOR) 25 MG tablet Take 1 tablet (25 mg total) by mouth 2 (two) times daily.  . montelukast (SINGULAIR) 10 MG tablet TAKE 1 TABLET(10 MG) BY MOUTH AT BEDTIME (Patient taking differently: Take 10 mg by mouth at bedtime.)  . ondansetron (ZOFRAN) 4 MG tablet Take 1 tablet (4 mg total) by mouth every 6 (six) hours as needed for nausea.  Marland Kitchen oxybutynin (DITROPAN) 5 MG tablet Take 5 mg by mouth in the morning and at bedtime.  Marland Kitchen oxyCODONE (OXY IR/ROXICODONE) 5 MG immediate release tablet Take 1 tablet (5 mg total) by mouth in the morning and at bedtime.  Marland Kitchen rOPINIRole (REQUIP)  0.5 MG tablet Take two tablets po BID for restless legs.  . SUMAtriptan (IMITREX) 100 MG tablet Take 1 tablet (100 mg total) by mouth every 2 (two) hours as needed (for migraine headaches.). May repeat in 1 hours if headache persists or recurs.  . Tiotropium Bromide Monohydrate (SPIRIVA RESPIMAT) 1.25 MCG/ACT AERS Inhale 2 puffs into the lungs daily.  . traZODone (DESYREL) 150 MG tablet Take 150-300 mg by mouth at bedtime.   .  valACYclovir (VALTREX) 500 MG tablet Take 500 mg by mouth daily as needed (for fever blisters).   No facility-administered encounter medications on file as of 04/30/2020.    Surgical History: Past Surgical History:  Procedure Laterality Date  . ABDOMINAL HYSTERECTOMY    . AMPUTATION TOE Left 07/31/2016   Procedure: AMPUTATION TOE/MPJ 2nd toe;  Surgeon: Sharlotte Alamo, DPM;  Location: [facility] ORS;  Service: Podiatry;  Laterality: Left;  . APPENDECTOMY  1990  . BACK SURGERY     low back  . BREAST SURGERY     bilateral breast reduction  . CARDIAC ELECTROPHYSIOLOGY STUDY AND ABLATION    . CHOLECYSTECTOMY  1990  . COLONOSCOPY WITH PROPOFOL N/A 01/04/2017   Procedure: COLONOSCOPY WITH PROPOFOL;  Surgeon: Manya Silvas, MD;  Location: [facility] ENDOSCOPY;  Service: Endoscopy;  Laterality: N/A;  . CORNEAL TRANSPLANT    . ESOPHAGOGASTRODUODENOSCOPY (EGD) WITH PROPOFOL N/A 01/04/2017   Procedure: ESOPHAGOGASTRODUODENOSCOPY (EGD) WITH PROPOFOL;  Surgeon: Manya Silvas, MD;  Location: [facility] ENDOSCOPY;  Service: Endoscopy;  Laterality: N/A;  . EXCISION BONE CYST Left 07/31/2016   Procedure: EXCISION BONE CYST/exostectomy 28124/left 2nd;  Surgeon: Sharlotte Alamo, DPM;  Location: [facility] ORS;  Service: Podiatry;  Laterality: Left;  . EXTRACORPOREAL SHOCK WAVE LITHOTRIPSY Left 09/12/2015   Procedure: EXTRACORPOREAL SHOCK WAVE LITHOTRIPSY (ESWL);  Surgeon: Hollice Espy, MD;  Location: [facility] ORS;  Service: Urology;  Laterality: Left;  . FRACTURE SURGERY     left foot  . HH repair    . JOINT REPLACEMENT Bilateral 2013,2014   total knees  . LAPAROSCOPIC HYSTERECTOMY    . LITHOTRIPSY    . periprosthetic supracondylar fracture of left femur  02/16/2020   [facility]  . TONSILLECTOMY    . TOTAL KNEE REVISION Right 11/14/2019   Procedure: Revision patella and tibial polyethylene;  Surgeon: Hessie Knows, MD;  Location: [facility] ORS;  Service: Orthopedics;  Laterality: Right;  . URETEROSCOPY      Medical  History: Past Medical History:  Diagnosis Date  . Acid reflux   . Anxiety   . Arrhythmia    treated with meds and has no current problems  . Arthritis    most uncomfortable in knees  . Asthma    uses several inhalers  . Depression   . Fever blister   . Hematuria   . History of kidney stones   . Hypertension   . Hypoglycemia   . Left flank pain   . Migraine   . Restless leg   . Yeast vaginitis     Family History: Family History  Problem Relation Age of Onset  . Prostate cancer Father   . Kidney Stones Father   . Kidney disease Neg Hx     Social History: Social History   Socioeconomic History  . Marital status: Married    Spouse name: Not on file  . Number of children: Not on file  . Years of education: Not on file  . Highest education level: Not on file  Occupational History  . Not on  file  Tobacco Use  . Smoking status: Never Smoker  . Smokeless tobacco: Never Used  Vaping Use  . Vaping Use: Never used  Substance and Sexual Activity  . Alcohol use: No  . Drug use: No  . Sexual activity: Not on file  Other Topics Concern  . Not on file  Social History Narrative  . Not on file   Social Determinants of Health   Financial Resource Strain: Not on file  Food Insecurity: Not on file  Transportation Needs: Not on file  Physical Activity: Not on file  Stress: Not on file  Social Connections: Not on file  Intimate Partner Violence: Not on file    Vital Signs: Blood pressure (!) 152/92, pulse 97, temperature 98.1 F (36.7 C), resp. rate 16, height 5\' 3"  (1.6 m), weight 162 lb 6.4 oz (73.7 kg), SpO2 97 %.  Examination: General Appearance: The patient is well-developed, well-nourished, and in no distress. Skin: Gross inspection of skin unremarkable. Head: normocephalic, no gross deformities. Eyes: no gross deformities noted. ENT: ears appear grossly normal no exudates. Neck: Supple. No thyromegaly. No LAD. Respiratory: no rhonchi  noted. Cardiovascular: Normal S1 and S2 without murmur or rub. Extremities: No cyanosis. pulses are equal. Neurologic: Alert and oriented. No involuntary movements.  LABS: Recent Results (from the past 2160 hour(s))  CBC     Status: Abnormal   Collection Time: 02/16/20  3:15 AM  Result Value Ref Range   WBC 6.4 4.0 - 10.5 K/uL   RBC 4.02 3.87 - 5.11 MIL/uL   Hemoglobin 10.3 (L) 12.0 - 15.0 g/dL   HCT 32.9 (L) 36.0 - 46.0 %   MCV 81.8 80.0 - 100.0 fL   MCH 25.6 (L) 26.0 - 34.0 pg   MCHC 31.3 30.0 - 36.0 g/dL   RDW 15.7 (H) 11.5 - 15.5 %   Platelets 191 150 - 400 K/uL   nRBC 0.0 0.0 - 0.2 %    Comment: Performed at [facility], [address] [place], [place] [address]  Basic metabolic panel     Status: Abnormal   Collection Time: 02/16/20  3:15 AM  Result Value Ref Range   Sodium 137 135 - 145 mmol/L   Potassium 2.9 (L) 3.5 - 5.1 mmol/L   Chloride 95 (L) 98 - 111 mmol/L   CO2 30 22 - 32 mmol/L   Glucose, Bld 162 (H) 70 - 99 mg/dL    Comment: Glucose reference range applies only to samples taken after fasting for at least 8 hours.   BUN 12 8 - 23 mg/dL   Creatinine, Ser 1.07 (H) 0.44 - 1.00 mg/dL   Calcium 8.3 (L) 8.9 - 10.3 mg/dL   GFR, Estimated 58 (L) >60 mL/min    Comment: (NOTE) Calculated using the CKD-EPI Creatinine Equation (2021)    Anion gap 12 5 - 15    Comment: Performed at [facility], [address] [place], [place] [address]  Resp Panel by RT-PCR (Flu A&B, Covid) Nasopharyngeal Swab     Status: None   Collection Time: 02/16/20  3:15 AM   Specimen: Nasopharyngeal Swab; Nasopharyngeal(NP) swabs in vial transport medium  Result Value Ref Range   SARS Coronavirus 2 by RT PCR NEGATIVE NEGATIVE    Comment: (NOTE) SARS-CoV-2 target nucleic acids are NOT DETECTED.  The SARS-CoV-2 RNA is generally detectable in upper respiratory specimens during the acute phase of infection. The lowest concentration of SARS-CoV-2 viral copies this assay can  detect is 138 copies/mL.  A negative result does not preclude SARS-Cov-2 infection and should not be used as the sole basis for treatment or other patient management decisions. A negative result may occur with  improper specimen collection/handling, submission of specimen other than nasopharyngeal swab, presence of viral mutation(s) within the areas targeted by this assay, and inadequate number of viral copies(<138 copies/mL). A negative result must be combined with clinical observations, patient history, and epidemiological information. The expected result is Negative.  Fact Sheet for Patients:  EntrepreneurPulse.com.au  Fact Sheet for Healthcare Providers:  IncredibleEmployment.be  This test is no t yet approved or cleared by the [place] FDA and  has been authorized for detection and/or diagnosis of SARS-CoV-2 by FDA under an Emergency Use Authorization (EUA). This EUA will remain  in effect (meaning this test can be used) for the duration of the COVID-19 declaration under Section 564(b)(1) of the Act, 21 U.S.C.section 360bbb-3(b)(1), unless the authorization is terminated  or revoked sooner.       Influenza A by PCR NEGATIVE NEGATIVE   Influenza B by PCR NEGATIVE NEGATIVE    Comment: (NOTE) The Xpert Xpress SARS-CoV-2/FLU/RSV plus assay is intended as an aid in the diagnosis of influenza from Nasopharyngeal swab specimens and should not be used as a sole basis for treatment. Nasal washings and aspirates are unacceptable for Xpert Xpress SARS-CoV-2/FLU/RSV testing.  Fact Sheet for Patients: EntrepreneurPulse.com.au  Fact Sheet for Healthcare Providers: IncredibleEmployment.be  This test is not yet approved or cleared by the [place] FDA and has been authorized for detection and/or diagnosis of SARS-CoV-2 by FDA under an Emergency Use Authorization (EUA). This EUA will remain in effect  (meaning this test can be used) for the duration of the COVID-19 declaration under Section 564(b)(1) of the Act, 21 U.S.C. section 360bbb-3(b)(1), unless the authorization is terminated or revoked.  Performed at [facility], [address] [place], [place] [address]     Radiology: DG Knee 1-2 Views Left  Result Date: 02/16/2020 CLINICAL DATA:  Initial evaluation for acute trauma, fall. EXAM: LEFT KNEE - 1-2 VIEW COMPARISON:  Prior radiograph from 10/26/2013. FINDINGS: Cemented left total knee arthroplasty in place. There is an acute comminuted periprosthetic fracture involving the distal left femur with impaction. Up to 1.3 cm of posterior displacement. Tibial and patellar components remain grossly intact. Diffuse soft tissue swelling seen about the knee. Associated joint effusion. IMPRESSION: Acute comminuted periprosthetic fracture involving the distal left femur. Electronically Signed   By: Jeannine Boga M.D.   On: 02/16/2020 01:43    No results found.  No results found.    Assessment and Plan: Patient Active Problem List   Diagnosis Date Noted  . 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 Seasonal allergic rhinitis due to pollen 09/20/2019  . Callus of foot 08/13/2019  . Candidal vaginitis 08/13/2019  . Other symptoms and signs involving the nervous system 08/13/2019  . Encounter for general adult medical examination with abnormal findings 08/13/2019  . Restless leg syndrome 04/30/2019  . Type 2 diabetes mellitus with hyperglycemia ([place]) 04/02/2019  . Muscle cramps 04/02/2019  . Diastolic dysfunction 57/84/6962  . SOB (shortness of breath) 12/14/2018  . Hiatal hernia with GERD 12/14/2018  . Wheezing 12/14/2018  . Impaired fasting glucose 12/14/2018  . COPD with acute exacerbation ([place]) 09/28/2018  . Acute upper respiratory infection 08/25/2018  . Recurrent sinusitis 08/15/2018  . Prediabetes 08/15/2018  . Nonintractable headache 08/15/2018  .  Dysuria 08/15/2018  . Intractable migraine without status  migrainosus 04/22/2018  . Nausea 04/22/2018  . Polyneuropathy associated with underlying disease ([place]) 04/22/2018  . Midline low back pain without sciatica 04/22/2018  . Routine cervical smear 04/22/2018  . Essential hypertension 07/01/2017  . Acute bronchitis with asthma 07/01/2017  . Allergic rhinitis 07/01/2017  . Moderate asthma without complication 94/76/5465  . Severe recurrent major depression without psychotic features ([place]) 09/10/2014  . COPD (chronic obstructive pulmonary disease) ([place]) 09/10/2014  . Calculi, ureter 04/26/2013  . Corneal graft malfunction 08/17/2012  . Calculus of kidney 04/26/2012  . Renal colic 03/54/6568  . Urge incontinence 04/26/2012  . Diaphragmatic hernia 10/27/2010  . Barrett esophagus 07/07/2010     1. Moderate asthma without complication, unspecified whether persistent   Seems to be under fair control right now continue with the current inhaler regimen - Pulmonary function test; Future  2. Seasonal allergic rhinitis due to pollen  this does seem to be controlled with antihistamines.  Prescription given today - loratadine (CLARITIN) 10 MG tablet; Take 1 tablet (10 mg total) by mouth daily.  Dispense: 30 tablet; Refill: 11  3. Gastroesophageal reflux disease without esophagitis  discussed with the importance of reflux prevention patient understands and she is going to use PPI as necessary  4. Obesity, morbid ([place]) Obesity Counseling: Had a lengthy discussion regarding patients BMI and weight issues. Patient was instructed on portion control as well as increased activity. Also discussed caloric restrictions with trying to maintain intake less than 2000 Kcal. Discussions were made in accordance with the 5As of weight management. Simple actions such as not eating late and if able to, taking a walk is suggested.    General Counseling: I have discussed the findings of the evaluation and  examination with Summer Murphy.  I have also discussed any further diagnostic evaluation thatmay be needed or ordered today. Saya verbalizes understanding of the findings of todays visit. We also reviewed her medications today and discussed drug interactions and side effects including but not limited excessive drowsiness and altered mental states. We also discussed that there is always a risk not just to her but also people around her. she has been encouraged to call the office with any questions or concerns that should arise related to todays visit.  No orders of the defined types were placed in this encounter.    Time spent: 62  I have personally obtained a history, examined the patient, evaluated laboratory and imaging results, formulated the assessment and plan and placed orders.    Allyne Gee, MD [facility] Pulmonary and Critical Care Sleep medicine

## 2020-05-03 DIAGNOSIS — K2270 Barrett's esophagus without dysplasia: Secondary | ICD-10-CM | POA: Diagnosis not present

## 2020-05-03 DIAGNOSIS — G4700 Insomnia, unspecified: Secondary | ICD-10-CM | POA: Diagnosis not present

## 2020-05-03 DIAGNOSIS — M069 Rheumatoid arthritis, unspecified: Secondary | ICD-10-CM | POA: Diagnosis not present

## 2020-05-03 DIAGNOSIS — N200 Calculus of kidney: Secondary | ICD-10-CM | POA: Diagnosis not present

## 2020-05-03 DIAGNOSIS — Z7951 Long term (current) use of inhaled steroids: Secondary | ICD-10-CM | POA: Diagnosis not present

## 2020-05-03 DIAGNOSIS — E1142 Type 2 diabetes mellitus with diabetic polyneuropathy: Secondary | ICD-10-CM | POA: Diagnosis not present

## 2020-05-03 DIAGNOSIS — G43919 Migraine, unspecified, intractable, without status migrainosus: Secondary | ICD-10-CM | POA: Diagnosis not present

## 2020-05-03 DIAGNOSIS — K5909 Other constipation: Secondary | ICD-10-CM | POA: Diagnosis not present

## 2020-05-03 DIAGNOSIS — Z96653 Presence of artificial knee joint, bilateral: Secondary | ICD-10-CM | POA: Diagnosis not present

## 2020-05-03 DIAGNOSIS — E1165 Type 2 diabetes mellitus with hyperglycemia: Secondary | ICD-10-CM | POA: Diagnosis not present

## 2020-05-03 DIAGNOSIS — G2581 Restless legs syndrome: Secondary | ICD-10-CM | POA: Diagnosis not present

## 2020-05-03 DIAGNOSIS — M80052D Age-related osteoporosis with current pathological fracture, left femur, subsequent encounter for fracture with routine healing: Secondary | ICD-10-CM | POA: Diagnosis not present

## 2020-05-03 DIAGNOSIS — M1990 Unspecified osteoarthritis, unspecified site: Secondary | ICD-10-CM | POA: Diagnosis not present

## 2020-05-03 DIAGNOSIS — J449 Chronic obstructive pulmonary disease, unspecified: Secondary | ICD-10-CM | POA: Diagnosis not present

## 2020-05-03 DIAGNOSIS — I10 Essential (primary) hypertension: Secondary | ICD-10-CM | POA: Diagnosis not present

## 2020-05-03 DIAGNOSIS — M9702XD Periprosthetic fracture around internal prosthetic left hip joint, subsequent encounter: Secondary | ICD-10-CM | POA: Diagnosis not present

## 2020-05-03 DIAGNOSIS — E785 Hyperlipidemia, unspecified: Secondary | ICD-10-CM | POA: Diagnosis not present

## 2020-05-03 DIAGNOSIS — Z9181 History of falling: Secondary | ICD-10-CM | POA: Diagnosis not present

## 2020-05-06 DIAGNOSIS — J449 Chronic obstructive pulmonary disease, unspecified: Secondary | ICD-10-CM | POA: Diagnosis not present

## 2020-05-06 DIAGNOSIS — Z96653 Presence of artificial knee joint, bilateral: Secondary | ICD-10-CM | POA: Diagnosis not present

## 2020-05-06 DIAGNOSIS — I10 Essential (primary) hypertension: Secondary | ICD-10-CM | POA: Diagnosis not present

## 2020-05-06 DIAGNOSIS — M069 Rheumatoid arthritis, unspecified: Secondary | ICD-10-CM | POA: Diagnosis not present

## 2020-05-06 DIAGNOSIS — Z9181 History of falling: Secondary | ICD-10-CM | POA: Diagnosis not present

## 2020-05-06 DIAGNOSIS — G43919 Migraine, unspecified, intractable, without status migrainosus: Secondary | ICD-10-CM | POA: Diagnosis not present

## 2020-05-06 DIAGNOSIS — N200 Calculus of kidney: Secondary | ICD-10-CM | POA: Diagnosis not present

## 2020-05-06 DIAGNOSIS — K5909 Other constipation: Secondary | ICD-10-CM | POA: Diagnosis not present

## 2020-05-06 DIAGNOSIS — E1142 Type 2 diabetes mellitus with diabetic polyneuropathy: Secondary | ICD-10-CM | POA: Diagnosis not present

## 2020-05-06 DIAGNOSIS — M80052D Age-related osteoporosis with current pathological fracture, left femur, subsequent encounter for fracture with routine healing: Secondary | ICD-10-CM | POA: Diagnosis not present

## 2020-05-06 DIAGNOSIS — Z96651 Presence of right artificial knee joint: Secondary | ICD-10-CM | POA: Diagnosis not present

## 2020-05-06 DIAGNOSIS — K2270 Barrett's esophagus without dysplasia: Secondary | ICD-10-CM | POA: Diagnosis not present

## 2020-05-06 DIAGNOSIS — M1990 Unspecified osteoarthritis, unspecified site: Secondary | ICD-10-CM | POA: Diagnosis not present

## 2020-05-06 DIAGNOSIS — G63 Polyneuropathy in diseases classified elsewhere: Secondary | ICD-10-CM | POA: Diagnosis not present

## 2020-05-06 DIAGNOSIS — E1165 Type 2 diabetes mellitus with hyperglycemia: Secondary | ICD-10-CM | POA: Diagnosis not present

## 2020-05-06 DIAGNOSIS — Z96659 Presence of unspecified artificial knee joint: Secondary | ICD-10-CM | POA: Diagnosis not present

## 2020-05-06 DIAGNOSIS — E785 Hyperlipidemia, unspecified: Secondary | ICD-10-CM | POA: Diagnosis not present

## 2020-05-06 DIAGNOSIS — M978XXA Periprosthetic fracture around other internal prosthetic joint, initial encounter: Secondary | ICD-10-CM | POA: Diagnosis not present

## 2020-05-06 DIAGNOSIS — M9702XD Periprosthetic fracture around internal prosthetic left hip joint, subsequent encounter: Secondary | ICD-10-CM | POA: Diagnosis not present

## 2020-05-06 DIAGNOSIS — Z7951 Long term (current) use of inhaled steroids: Secondary | ICD-10-CM | POA: Diagnosis not present

## 2020-05-06 DIAGNOSIS — G4700 Insomnia, unspecified: Secondary | ICD-10-CM | POA: Diagnosis not present

## 2020-05-06 DIAGNOSIS — G2581 Restless legs syndrome: Secondary | ICD-10-CM | POA: Diagnosis not present

## 2020-05-07 DIAGNOSIS — M80052D Age-related osteoporosis with current pathological fracture, left femur, subsequent encounter for fracture with routine healing: Secondary | ICD-10-CM | POA: Diagnosis not present

## 2020-05-07 DIAGNOSIS — N200 Calculus of kidney: Secondary | ICD-10-CM | POA: Diagnosis not present

## 2020-05-07 DIAGNOSIS — G43919 Migraine, unspecified, intractable, without status migrainosus: Secondary | ICD-10-CM | POA: Diagnosis not present

## 2020-05-07 DIAGNOSIS — E1142 Type 2 diabetes mellitus with diabetic polyneuropathy: Secondary | ICD-10-CM | POA: Diagnosis not present

## 2020-05-07 DIAGNOSIS — M9702XD Periprosthetic fracture around internal prosthetic left hip joint, subsequent encounter: Secondary | ICD-10-CM | POA: Diagnosis not present

## 2020-05-07 DIAGNOSIS — M069 Rheumatoid arthritis, unspecified: Secondary | ICD-10-CM | POA: Diagnosis not present

## 2020-05-07 DIAGNOSIS — K2270 Barrett's esophagus without dysplasia: Secondary | ICD-10-CM | POA: Diagnosis not present

## 2020-05-07 DIAGNOSIS — E1165 Type 2 diabetes mellitus with hyperglycemia: Secondary | ICD-10-CM | POA: Diagnosis not present

## 2020-05-07 DIAGNOSIS — K5909 Other constipation: Secondary | ICD-10-CM | POA: Diagnosis not present

## 2020-05-07 DIAGNOSIS — Z9181 History of falling: Secondary | ICD-10-CM | POA: Diagnosis not present

## 2020-05-07 DIAGNOSIS — G2581 Restless legs syndrome: Secondary | ICD-10-CM | POA: Diagnosis not present

## 2020-05-07 DIAGNOSIS — E785 Hyperlipidemia, unspecified: Secondary | ICD-10-CM | POA: Diagnosis not present

## 2020-05-07 DIAGNOSIS — M1990 Unspecified osteoarthritis, unspecified site: Secondary | ICD-10-CM | POA: Diagnosis not present

## 2020-05-07 DIAGNOSIS — J449 Chronic obstructive pulmonary disease, unspecified: Secondary | ICD-10-CM | POA: Diagnosis not present

## 2020-05-07 DIAGNOSIS — I10 Essential (primary) hypertension: Secondary | ICD-10-CM | POA: Diagnosis not present

## 2020-05-07 DIAGNOSIS — Z7951 Long term (current) use of inhaled steroids: Secondary | ICD-10-CM | POA: Diagnosis not present

## 2020-05-07 DIAGNOSIS — G4700 Insomnia, unspecified: Secondary | ICD-10-CM | POA: Diagnosis not present

## 2020-05-07 DIAGNOSIS — Z96653 Presence of artificial knee joint, bilateral: Secondary | ICD-10-CM | POA: Diagnosis not present

## 2020-05-13 DIAGNOSIS — G4700 Insomnia, unspecified: Secondary | ICD-10-CM | POA: Diagnosis not present

## 2020-05-13 DIAGNOSIS — J449 Chronic obstructive pulmonary disease, unspecified: Secondary | ICD-10-CM | POA: Diagnosis not present

## 2020-05-13 DIAGNOSIS — G2581 Restless legs syndrome: Secondary | ICD-10-CM | POA: Diagnosis not present

## 2020-05-13 DIAGNOSIS — I10 Essential (primary) hypertension: Secondary | ICD-10-CM | POA: Diagnosis not present

## 2020-05-13 DIAGNOSIS — M80052D Age-related osteoporosis with current pathological fracture, left femur, subsequent encounter for fracture with routine healing: Secondary | ICD-10-CM | POA: Diagnosis not present

## 2020-05-13 DIAGNOSIS — M9702XD Periprosthetic fracture around internal prosthetic left hip joint, subsequent encounter: Secondary | ICD-10-CM | POA: Diagnosis not present

## 2020-05-13 DIAGNOSIS — N200 Calculus of kidney: Secondary | ICD-10-CM | POA: Diagnosis not present

## 2020-05-13 DIAGNOSIS — Z7951 Long term (current) use of inhaled steroids: Secondary | ICD-10-CM | POA: Diagnosis not present

## 2020-05-13 DIAGNOSIS — M069 Rheumatoid arthritis, unspecified: Secondary | ICD-10-CM | POA: Diagnosis not present

## 2020-05-13 DIAGNOSIS — K2270 Barrett's esophagus without dysplasia: Secondary | ICD-10-CM | POA: Diagnosis not present

## 2020-05-13 DIAGNOSIS — Z96653 Presence of artificial knee joint, bilateral: Secondary | ICD-10-CM | POA: Diagnosis not present

## 2020-05-13 DIAGNOSIS — G43919 Migraine, unspecified, intractable, without status migrainosus: Secondary | ICD-10-CM | POA: Diagnosis not present

## 2020-05-13 DIAGNOSIS — E1142 Type 2 diabetes mellitus with diabetic polyneuropathy: Secondary | ICD-10-CM | POA: Diagnosis not present

## 2020-05-13 DIAGNOSIS — Z9181 History of falling: Secondary | ICD-10-CM | POA: Diagnosis not present

## 2020-05-13 DIAGNOSIS — E785 Hyperlipidemia, unspecified: Secondary | ICD-10-CM | POA: Diagnosis not present

## 2020-05-13 DIAGNOSIS — E1165 Type 2 diabetes mellitus with hyperglycemia: Secondary | ICD-10-CM | POA: Diagnosis not present

## 2020-05-13 DIAGNOSIS — K5909 Other constipation: Secondary | ICD-10-CM | POA: Diagnosis not present

## 2020-05-13 DIAGNOSIS — M1990 Unspecified osteoarthritis, unspecified site: Secondary | ICD-10-CM | POA: Diagnosis not present

## 2020-05-15 DIAGNOSIS — M1990 Unspecified osteoarthritis, unspecified site: Secondary | ICD-10-CM | POA: Diagnosis not present

## 2020-05-15 DIAGNOSIS — J449 Chronic obstructive pulmonary disease, unspecified: Secondary | ICD-10-CM | POA: Diagnosis not present

## 2020-05-15 DIAGNOSIS — G2581 Restless legs syndrome: Secondary | ICD-10-CM | POA: Diagnosis not present

## 2020-05-15 DIAGNOSIS — E1165 Type 2 diabetes mellitus with hyperglycemia: Secondary | ICD-10-CM | POA: Diagnosis not present

## 2020-05-15 DIAGNOSIS — I10 Essential (primary) hypertension: Secondary | ICD-10-CM | POA: Diagnosis not present

## 2020-05-15 DIAGNOSIS — K2270 Barrett's esophagus without dysplasia: Secondary | ICD-10-CM | POA: Diagnosis not present

## 2020-05-15 DIAGNOSIS — M978XXD Periprosthetic fracture around other internal prosthetic joint, subsequent encounter: Secondary | ICD-10-CM | POA: Diagnosis not present

## 2020-05-15 DIAGNOSIS — K5909 Other constipation: Secondary | ICD-10-CM | POA: Diagnosis not present

## 2020-05-15 DIAGNOSIS — M6281 Muscle weakness (generalized): Secondary | ICD-10-CM | POA: Diagnosis not present

## 2020-05-15 DIAGNOSIS — M069 Rheumatoid arthritis, unspecified: Secondary | ICD-10-CM | POA: Diagnosis not present

## 2020-05-15 DIAGNOSIS — Z96653 Presence of artificial knee joint, bilateral: Secondary | ICD-10-CM | POA: Diagnosis not present

## 2020-05-15 DIAGNOSIS — E785 Hyperlipidemia, unspecified: Secondary | ICD-10-CM | POA: Diagnosis not present

## 2020-05-15 DIAGNOSIS — E1142 Type 2 diabetes mellitus with diabetic polyneuropathy: Secondary | ICD-10-CM | POA: Diagnosis not present

## 2020-05-15 DIAGNOSIS — G4700 Insomnia, unspecified: Secondary | ICD-10-CM | POA: Diagnosis not present

## 2020-05-15 DIAGNOSIS — N200 Calculus of kidney: Secondary | ICD-10-CM | POA: Diagnosis not present

## 2020-05-15 DIAGNOSIS — G43919 Migraine, unspecified, intractable, without status migrainosus: Secondary | ICD-10-CM | POA: Diagnosis not present

## 2020-05-15 DIAGNOSIS — M9702XD Periprosthetic fracture around internal prosthetic left hip joint, subsequent encounter: Secondary | ICD-10-CM | POA: Diagnosis not present

## 2020-05-15 DIAGNOSIS — M80052D Age-related osteoporosis with current pathological fracture, left femur, subsequent encounter for fracture with routine healing: Secondary | ICD-10-CM | POA: Diagnosis not present

## 2020-05-15 DIAGNOSIS — Z9181 History of falling: Secondary | ICD-10-CM | POA: Diagnosis not present

## 2020-05-15 DIAGNOSIS — Z7951 Long term (current) use of inhaled steroids: Secondary | ICD-10-CM | POA: Diagnosis not present

## 2020-05-20 ENCOUNTER — Other Ambulatory Visit: Payer: Self-pay | Admitting: Nurse Practitioner

## 2020-05-20 DIAGNOSIS — J4541 Moderate persistent asthma with (acute) exacerbation: Secondary | ICD-10-CM

## 2020-05-20 DIAGNOSIS — G63 Polyneuropathy in diseases classified elsewhere: Secondary | ICD-10-CM

## 2020-05-29 DIAGNOSIS — Z882 Allergy status to sulfonamides status: Secondary | ICD-10-CM | POA: Diagnosis not present

## 2020-05-29 DIAGNOSIS — Z9049 Acquired absence of other specified parts of digestive tract: Secondary | ICD-10-CM | POA: Diagnosis not present

## 2020-05-29 DIAGNOSIS — Z886 Allergy status to analgesic agent status: Secondary | ICD-10-CM | POA: Diagnosis not present

## 2020-05-29 DIAGNOSIS — Z91018 Allergy to other foods: Secondary | ICD-10-CM | POA: Diagnosis not present

## 2020-05-29 DIAGNOSIS — Z881 Allergy status to other antibiotic agents status: Secondary | ICD-10-CM | POA: Diagnosis not present

## 2020-05-29 DIAGNOSIS — Z888 Allergy status to other drugs, medicaments and biological substances status: Secondary | ICD-10-CM | POA: Diagnosis not present

## 2020-05-29 DIAGNOSIS — K2270 Barrett's esophagus without dysplasia: Secondary | ICD-10-CM | POA: Diagnosis not present

## 2020-05-29 DIAGNOSIS — Z9889 Other specified postprocedural states: Secondary | ICD-10-CM | POA: Diagnosis not present

## 2020-05-29 DIAGNOSIS — I10 Essential (primary) hypertension: Secondary | ICD-10-CM | POA: Diagnosis not present

## 2020-05-29 DIAGNOSIS — K2100 Gastro-esophageal reflux disease with esophagitis, without bleeding: Secondary | ICD-10-CM | POA: Diagnosis not present

## 2020-05-29 DIAGNOSIS — Z79899 Other long term (current) drug therapy: Secondary | ICD-10-CM | POA: Diagnosis not present

## 2020-05-29 DIAGNOSIS — J449 Chronic obstructive pulmonary disease, unspecified: Secondary | ICD-10-CM | POA: Diagnosis not present

## 2020-05-29 DIAGNOSIS — K222 Esophageal obstruction: Secondary | ICD-10-CM | POA: Diagnosis not present

## 2020-05-29 DIAGNOSIS — R519 Headache, unspecified: Secondary | ICD-10-CM | POA: Diagnosis not present

## 2020-06-06 ENCOUNTER — Ambulatory Visit (INDEPENDENT_AMBULATORY_CARE_PROVIDER_SITE_OTHER): Payer: Medicaid Other | Admitting: Hospice and Palliative Medicine

## 2020-06-06 ENCOUNTER — Encounter: Payer: Self-pay | Admitting: Hospice and Palliative Medicine

## 2020-06-06 VITALS — BP 138/88 | HR 91 | Temp 97.4°F | Resp 16 | Ht 63.0 in | Wt 163.8 lb

## 2020-06-06 DIAGNOSIS — R300 Dysuria: Secondary | ICD-10-CM

## 2020-06-06 DIAGNOSIS — J301 Allergic rhinitis due to pollen: Secondary | ICD-10-CM | POA: Diagnosis not present

## 2020-06-06 DIAGNOSIS — B3731 Acute candidiasis of vulva and vagina: Secondary | ICD-10-CM

## 2020-06-06 DIAGNOSIS — B373 Candidiasis of vulva and vagina: Secondary | ICD-10-CM

## 2020-06-06 DIAGNOSIS — B370 Candidal stomatitis: Secondary | ICD-10-CM | POA: Diagnosis not present

## 2020-06-06 DIAGNOSIS — J4541 Moderate persistent asthma with (acute) exacerbation: Secondary | ICD-10-CM | POA: Diagnosis not present

## 2020-06-06 MED ORDER — MONTELUKAST SODIUM 10 MG PO TABS: 10.0000 mg | ORAL_TABLET | Freq: Every day | ORAL | 0 refills | Status: DC

## 2020-06-06 MED ORDER — LORATADINE 10 MG PO TABS: 10.0000 mg | ORAL_TABLET | Freq: Every day | ORAL | 11 refills | Status: DC

## 2020-06-06 MED ORDER — NYSTATIN 100000 UNIT/ML MT SUSP: 5.0000 mL | Freq: Four times a day (QID) | OROMUCOSAL | 0 refills | Status: DC

## 2020-06-06 MED ORDER — CLOTRIMAZOLE-BETAMETHASONE 1-0.05 % EX CREA: 1.0000 "application " | TOPICAL_CREAM | Freq: Two times a day (BID) | CUTANEOUS | 3 refills | Status: DC

## 2020-06-06 NOTE — Progress Notes (Signed)
[facility] [place], [place] [address]  Internal MEDICINE  Office Visit Note  Patient Name: Summer Murphy  [id number]  [id number]  Date of Service: 06/07/2020  Chief Complaint  Patient presents with  . Acute Visit    Sore on tongue, started 05-31-20, possible yeast infection, red not white burns   . Quality Metric Gaps    mammogram     HPI Pt is here for a sick visit. C/o sores in her mouth, has had thrush before and pain feels similar, has noticed small red bumps down the middle of her tongue They are sore and makes if difficult to swallow due to pain First noticed pain and pump about a week ago, pain has stayed the same since then  Has been complaint with rinsing out mouth after each use of her inhaler   Current Medication:  Outpatient Encounter Medications as of 06/06/2020  Medication Sig  . clotrimazole-betamethasone (LOTRISONE) cream Apply 1 application topically 2 (two) times daily.  Marland Kitchen nystatin (MYCOSTATIN) 100000 UNIT/ML suspension Take 5 mLs (500,000 Units total) by mouth 4 (four) times daily.  . Biotin 800 MCG TABS Take 1 tablet (800 mcg total) by mouth daily.  . Brexpiprazole 4 MG TABS Take 4 mg by mouth daily.   . busPIRone (BUSPAR) 10 MG tablet Take 10 mg by mouth 2 (two) times daily.   . diclofenac sodium (VOLTAREN) 1 % GEL Apply 2 g topically in the morning, at noon, and at bedtime.  Marland Kitchen diltiazem (DILACOR XR) 180 MG 24 hr capsule Take 1 capsule (180 mg total) by mouth daily.  . diphenhydrAMINE (BENADRYL) 25 MG tablet Take 50 mg by mouth 2 (two) times daily as needed.   . diphenoxylate-atropine (LOMOTIL) 2.5-0.025 MG tablet Take 1 tablet by mouth 3 (three) times daily as needed for diarrhea or loose stools.  . docusate sodium (COLACE) 100 MG capsule Take 1 capsule (100 mg total) by mouth 2 (two) times daily.  . DULoxetine (CYMBALTA) 60 MG capsule Take 60 mg by mouth daily.  . fluticasone furoate-vilanterol (BREO ELLIPTA) 200-25  MCG/INH AEPB Inhale 1 puff into the lungs daily.  Marland Kitchen gabapentin (NEURONTIN) 600 MG tablet TAKE 1 TABLET(600 MG) BY MOUTH TWICE DAILY  . Ipratropium-Albuterol (COMBIVENT RESPIMAT) 20-100 MCG/ACT AERS respimat INHALE 1 PUFF INTO THE LUNGS EVERY 6 HOURS  . ipratropium-albuterol (DUONEB) 0.5-2.5 (3) MG/3ML SOLN Take 3 mLs by nebulization every 4 (four) hours as needed (for shortness of breath/wheezing).  Marland Kitchen loratadine (CLARITIN) 10 MG tablet Take 1 tablet (10 mg total) by mouth daily.  Marland Kitchen LORazepam (ATIVAN) 1 MG tablet Take 1 mg by mouth at bedtime as needed for sleep.   . metoprolol tartrate (LOPRESSOR) 25 MG tablet Take 1 tablet (25 mg total) by mouth 2 (two) times daily.  . montelukast (SINGULAIR) 10 MG tablet Take 1 tablet (10 mg total) by mouth at bedtime.  . ondansetron (ZOFRAN) 4 MG tablet Take 1 tablet (4 mg total) by mouth every 6 (six) hours as needed for nausea.  Marland Kitchen oxybutynin (DITROPAN) 5 MG tablet Take 5 mg by mouth in the morning and at bedtime.  Marland Kitchen oxyCODONE (OXY IR/ROXICODONE) 5 MG immediate release tablet Take 1 tablet (5 mg total) by mouth in the morning and at bedtime.  Marland Kitchen rOPINIRole (REQUIP) 0.5 MG tablet Take two tablets po BID for restless legs.  . SPIRIVA RESPIMAT 1.25 MCG/ACT AERS INHALE 2 PUFFS INTO THE LUNGS DAILY  . SUMAtriptan (IMITREX) 100 MG tablet Take 1 tablet (  100 mg total) by mouth every 2 (two) hours as needed (for migraine headaches.). May repeat in 1 hours if headache persists or recurs.  . traZODone (DESYREL) 150 MG tablet Take 150-300 mg by mouth at bedtime.   . valACYclovir (VALTREX) 500 MG tablet Take 500 mg by mouth daily as needed (for fever blisters).  . [DISCONTINUED] ketoconazole (NIZORAL) 2 % cream Apply 1 application topically daily. (Patient taking differently: Apply 1 application topically daily as needed for irritation.)  . [DISCONTINUED] loratadine (CLARITIN) 10 MG tablet Take 1 tablet (10 mg total) by mouth daily.  . [DISCONTINUED] montelukast (SINGULAIR)  10 MG tablet TAKE 1 TABLET(10 MG) BY MOUTH AT BEDTIME (Patient taking differently: Take 10 mg by mouth at bedtime.)   No facility-administered encounter medications on file as of 06/06/2020.      Medical History: Past Medical History:  Diagnosis Date  . Acid reflux   . Anxiety   . Arrhythmia    treated with meds and has no current problems  . Arthritis    most uncomfortable in knees  . Asthma    uses several inhalers  . Depression   . Fever blister   . Hematuria   . History of kidney stones   . Hypertension   . Hypoglycemia   . Left flank pain   . Migraine   . Restless leg   . Yeast vaginitis      Vital Signs: BP 138/88   Pulse 91   Temp (!) 97.4 F (36.3 C)   Resp 16   Ht 5\' 3"  (1.6 m)   Wt 163 lb 12.8 oz (74.3 kg)   SpO2 97%   BMI 29.02 kg/m    Review of Systems  Constitutional: Negative for chills, diaphoresis and fatigue.  HENT: Negative for ear pain, postnasal drip and sinus pressure.   Eyes: Negative for photophobia, discharge, redness, itching and visual disturbance.  Respiratory: Negative for cough, shortness of breath and wheezing.   Cardiovascular: Negative for chest pain, palpitations and leg swelling.  Gastrointestinal: Negative for abdominal pain, constipation, diarrhea, nausea and vomiting.  Genitourinary: Negative for dysuria and flank pain.  Musculoskeletal: Negative for arthralgias, back pain, gait problem and neck pain.  Skin: Negative for color change.  Allergic/Immunologic: Negative for environmental allergies and food allergies.  Neurological: Negative for dizziness and headaches.  Hematological: Does not bruise/bleed easily.  Psychiatric/Behavioral: Negative for agitation, behavioral problems (depression) and hallucinations.    Physical Exam Vitals reviewed.  Constitutional:      Appearance: Normal appearance. She is normal weight.  HENT:     Mouth/Throat:     Comments: Erythema on mid tongue, no evidence of sores or  lesions Cardiovascular:     Rate and Rhythm: Normal rate and regular rhythm.     Pulses: Normal pulses.     Heart sounds: Normal heart sounds.  Pulmonary:     Effort: Pulmonary effort is normal.     Breath sounds: Normal breath sounds.  Neurological:     General: No focal deficit present.     Mental Status: She is alert and oriented to person, place, and time. Mental status is at baseline.  Psychiatric:        Mood and Affect: Mood normal.        Behavior: Behavior normal.        Thought Content: Thought content normal.        Judgment: Judgment normal.    Assessment/Plan: 1. Oral thrush Rinse with nystatin, will follow-up at  next visit next month - nystatin (MYCOSTATIN) 100000 UNIT/ML suspension; Take 5 mLs (500,000 Units total) by mouth 4 (four) times daily.  Dispense: 60 mL; Refill: 0  2. Seasonal allergic rhinitis due to pollen Stable, requesting refills - loratadine (CLARITIN) 10 MG tablet; Take 1 tablet (10 mg total) by mouth daily.  Dispense: 30 tablet; Refill: 11  3. Moderate persistent asthma with acute exacerbation Stable, requesting refills - montelukast (SINGULAIR) 10 MG tablet; Take 1 tablet (10 mg total) by mouth at bedtime.  Dispense: 90 tablet; Refill: 0  4. Candidal vaginitis Requesting refills, history of recurrent infections - clotrimazole-betamethasone (LOTRISONE) cream; Apply 1 application topically 2 (two) times daily.  Dispense: 45 g; Refill: 3   General Counseling: Doral verbalizes understanding of the findings of todays visit and agrees with plan of treatment. I have discussed any further diagnostic evaluation that may be needed or ordered today. We also reviewed her medications today. she has been encouraged to call the office with any questions or concerns that should arise related to todays visit.  Meds ordered this encounter  Medications  . loratadine (CLARITIN) 10 MG tablet    Sig: Take 1 tablet (10 mg total) by mouth daily.    Dispense:  30  tablet    Refill:  11  . montelukast (SINGULAIR) 10 MG tablet    Sig: Take 1 tablet (10 mg total) by mouth at bedtime.    Dispense:  90 tablet    Refill:  0  . nystatin (MYCOSTATIN) 100000 UNIT/ML suspension    Sig: Take 5 mLs (500,000 Units total) by mouth 4 (four) times daily.    Dispense:  60 mL    Refill:  0  . clotrimazole-betamethasone (LOTRISONE) cream    Sig: Apply 1 application topically 2 (two) times daily.    Dispense:  45 g    Refill:  3    Time spent: 25 Minutes Time spent includes review of chart, medications, test results and follow-up plan with the patient.  This patient was seen by Theodoro Grist AGNP-C in Collaboration with Dr Lavera Guise as a part of collaborative care agreement.  Tanna Furry [facility] Internal Medicine

## 2020-06-07 ENCOUNTER — Encounter: Payer: Self-pay | Admitting: Hospice and Palliative Medicine

## 2020-06-10 ENCOUNTER — Telehealth: Payer: Self-pay

## 2020-06-10 NOTE — Telephone Encounter (Signed)
lmom to cal [place] back pt left message in voicemail

## 2020-06-11 ENCOUNTER — Telehealth: Payer: Self-pay

## 2020-06-11 NOTE — Telephone Encounter (Signed)
Pt called she had bump and red on her tongue when we see her last week nothing there advised pt to come in today she don't have ride advised pt need to go to urgent care or make appt to see [place]

## 2020-06-12 ENCOUNTER — Ambulatory Visit: Payer: Self-pay | Admitting: Internal Medicine

## 2020-06-12 DIAGNOSIS — M6281 Muscle weakness (generalized): Secondary | ICD-10-CM | POA: Diagnosis not present

## 2020-06-12 DIAGNOSIS — J449 Chronic obstructive pulmonary disease, unspecified: Secondary | ICD-10-CM | POA: Diagnosis not present

## 2020-06-12 DIAGNOSIS — M978XXD Periprosthetic fracture around other internal prosthetic joint, subsequent encounter: Secondary | ICD-10-CM | POA: Diagnosis not present

## 2020-06-13 DIAGNOSIS — M2012 Hallux valgus (acquired), left foot: Secondary | ICD-10-CM | POA: Diagnosis not present

## 2020-06-13 DIAGNOSIS — M79674 Pain in right toe(s): Secondary | ICD-10-CM | POA: Diagnosis not present

## 2020-06-13 DIAGNOSIS — L600 Ingrowing nail: Secondary | ICD-10-CM | POA: Diagnosis not present

## 2020-06-13 DIAGNOSIS — B351 Tinea unguium: Secondary | ICD-10-CM | POA: Diagnosis not present

## 2020-06-13 DIAGNOSIS — M79675 Pain in left toe(s): Secondary | ICD-10-CM | POA: Diagnosis not present

## 2020-06-13 DIAGNOSIS — M2041 Other hammer toe(s) (acquired), right foot: Secondary | ICD-10-CM | POA: Diagnosis not present

## 2020-06-14 ENCOUNTER — Telehealth: Payer: Self-pay | Admitting: Internal Medicine

## 2020-06-14 NOTE — Progress Notes (Signed)
  Chronic Care Management   Note  06/14/2020 Name: Summer Murphy MRN: [id number] DOB: 11/21/1955  Summer Murphy is a 66 y.o. year old female who is a primary care patient of Lavera Guise, MD. I reached out to Waldon Merl by phone today in response to a referral sent by [name] PCP, Lavera Guise, MD.   [name] was given information about Chronic Care Management services today including:  1. CCM service includes personalized support from designated clinical staff supervised by her physician, including individualized plan of care and coordination with other care providers 2. 24/7 contact phone numbers for assistance for urgent and routine care needs. 3. Service will only be billed when office clinical staff spend 20 minutes or more in a month to coordinate care. 4. Only one practitioner may furnish and bill the service in a calendar month. 5. The patient may stop CCM services at any time (effective at the end of the month) by phone call to the office staff.   Patient agreed to services and verbal consent obtained.   Follow up plan:   Carley Perdue [employer] Scheduler

## 2020-06-20 DIAGNOSIS — M978XXA Periprosthetic fracture around other internal prosthetic joint, initial encounter: Secondary | ICD-10-CM | POA: Diagnosis not present

## 2020-06-20 DIAGNOSIS — Z96659 Presence of unspecified artificial knee joint: Secondary | ICD-10-CM | POA: Diagnosis not present

## 2020-07-02 ENCOUNTER — Ambulatory Visit: Payer: Medicare Other | Admitting: Hospice and Palliative Medicine

## 2020-07-04 ENCOUNTER — Telehealth: Payer: Self-pay | Admitting: Pharmacist

## 2020-07-04 NOTE — Progress Notes (Addendum)
Chronic Care Management Pharmacy Assistant   Name: Summer Murphy  MRN: [id number] DOB: 08-24-1955  Reason for Encounter: Initial Questions  Medications: Outpatient Encounter Medications as of 07/04/2020  Medication Sig   Biotin 800 MCG TABS Take 1 tablet (800 mcg total) by mouth daily.   Brexpiprazole 4 MG TABS Take 4 mg by mouth daily.    busPIRone (BUSPAR) 10 MG tablet Take 10 mg by mouth 2 (two) times daily.    clotrimazole-betamethasone (LOTRISONE) cream Apply 1 application topically 2 (two) times daily.   diclofenac sodium (VOLTAREN) 1 % GEL Apply 2 g topically in the morning, at noon, and at bedtime.   diltiazem (DILACOR XR) 180 MG 24 hr capsule Take 1 capsule (180 mg total) by mouth daily.   diphenhydrAMINE (BENADRYL) 25 MG tablet Take 50 mg by mouth 2 (two) times daily as needed.    diphenoxylate-atropine (LOMOTIL) 2.5-0.025 MG tablet Take 1 tablet by mouth 3 (three) times daily as needed for diarrhea or loose stools.   docusate sodium (COLACE) 100 MG capsule Take 1 capsule (100 mg total) by mouth 2 (two) times daily.   DULoxetine (CYMBALTA) 60 MG capsule Take 60 mg by mouth daily.   fluticasone furoate-vilanterol (BREO ELLIPTA) 200-25 MCG/INH AEPB Inhale 1 puff into the lungs daily.   gabapentin (NEURONTIN) 600 MG tablet TAKE 1 TABLET(600 MG) BY MOUTH TWICE DAILY   Ipratropium-Albuterol (COMBIVENT RESPIMAT) 20-100 MCG/ACT AERS respimat INHALE 1 PUFF INTO THE LUNGS EVERY 6 HOURS   ipratropium-albuterol (DUONEB) 0.5-2.5 (3) MG/3ML SOLN Take 3 mLs by nebulization every 4 (four) hours as needed (for shortness of breath/wheezing).   loratadine (CLARITIN) 10 MG tablet Take 1 tablet (10 mg total) by mouth daily.   LORazepam (ATIVAN) 1 MG tablet Take 1 mg by mouth at bedtime as needed for sleep.    metoprolol tartrate (LOPRESSOR) 25 MG tablet Take 1 tablet (25 mg total) by mouth 2 (two) times daily.   montelukast (SINGULAIR) 10 MG tablet Take 1 tablet (10 mg total) by mouth at  bedtime.   nystatin (MYCOSTATIN) 100000 UNIT/ML suspension Take 5 mLs (500,000 Units total) by mouth 4 (four) times daily.   ondansetron (ZOFRAN) 4 MG tablet Take 1 tablet (4 mg total) by mouth every 6 (six) hours as needed for nausea.   oxybutynin (DITROPAN) 5 MG tablet Take 5 mg by mouth in the morning and at bedtime.   oxyCODONE (OXY IR/ROXICODONE) 5 MG immediate release tablet Take 1 tablet (5 mg total) by mouth in the morning and at bedtime.   rOPINIRole (REQUIP) 0.5 MG tablet Take two tablets po BID for restless legs.   SPIRIVA RESPIMAT 1.25 MCG/ACT AERS INHALE 2 PUFFS INTO THE LUNGS DAILY   SUMAtriptan (IMITREX) 100 MG tablet Take 1 tablet (100 mg total) by mouth every 2 (two) hours as needed (for migraine headaches.). May repeat in 1 hours if headache persists or recurs.   traZODone (DESYREL) 150 MG tablet Take 150-300 mg by mouth at bedtime.    valACYclovir (VALTREX) 500 MG tablet Take 500 mg by mouth daily as needed (for fever blisters).   No facility-administered encounter medications on file as of 07/04/2020.    Have you seen any other providers since your last visit? Patient stated she recently seen a doctor could not remember the name but stated it was her nerve doctor.  Any changes in your medications or health? Patient stated the doctor changed one medication but was unable to remember which one she stated she  would make sure to bring all of her medications to the appointment.   Any side effects from any medications? Patient stated no.  Do you have an symptoms or problems not managed by your medications? Patient stated no.   Any concerns about your health right now? Patient stated no.  Has your provider asked that you check blood pressure, blood sugar, or follow special diet at home? Patient stated no.  Do you get any type of exercise on a regular basis? Patient stated no but she is supposed to be exercising.  Can you think of a goal you would like to reach for your health?  Patient stated she would like to loose some weight.  Do you have any problems getting your medications? Patient stated no.  Is there anything that you would like to discuss during the appointment? Patient stated no  Please bring medications and supplements to appointment, patient reminded of face to face appointment on 4/20 at 130 pm   Follow-Up:Pharmacist Review  Charlann Lange, [place] Pharmacist Assistant [phone]   3 minutes spent in review, coordination, and documentation.  Reviewed by: Beverly Milch, PharmD Clinical Pharmacist [facility] [phone]

## 2020-07-04 NOTE — Progress Notes (Deleted)
Chronic Care Management Pharmacy Note  07/04/2020 Name:  Summer Murphy MRN:  [id number] DOB:  10/02/1955  Subjective: Summer Murphy is an 65 y.o. year old female who is a primary patient of Summer Rolls Timoteo Gaul, MD.  The CCM team was consulted for assistance with disease management and care coordination needs.    Engaged with patient face to face for initial visit in response to provider referral for pharmacy case management and/or care coordination services.   Consent to Services:  The patient was given the following information about Chronic Care Management services today, agreed to services, and gave verbal consent: 1. CCM service includes personalized support from designated clinical staff supervised by the primary care provider, including individualized plan of care and coordination with other care providers 2. 24/7 contact phone numbers for assistance for urgent and routine care needs. 3. Service will only be billed when office clinical staff spend 20 minutes or more in a month to coordinate care. 4. Only one practitioner may furnish and bill the service in a calendar month. 5.The patient may stop CCM services at any time (effective at the end of the month) by phone call to the office staff. 6. The patient will be responsible for cost sharing (co-pay) of up to 20% of the service fee (after annual deductible is met). Patient agreed to services and consent obtained.  Patient Care Team: Lavera Guise, MD as PCP - General (Internal Medicine) Edythe Clarity, [facility] as Pharmacist (Pharmacist)  Recent office visits: 06/06/20 Summer Murphy) - thrush in mouth treated with nystatin rinse  04/30/20 Summer Rolls) - no changes to medication, discussed 2000Kcal diet and ordered PFTs for future evaluation.  04/03/20 Summer Murphy) -  Recent consult visits: [facility] visits: {Hospital DC Yes/No:25215}  Objective:  Lab Results  Component Value Date   CREATININE 1.07 (H) 02/16/2020   BUN 12 02/16/2020    GFRNONAA 58 (L) 02/16/2020   GFRAA >60 11/16/2019   NA 137 02/16/2020   K 2.9 (L) 02/16/2020   CALCIUM 8.3 (L) 02/16/2020   CO2 30 02/16/2020   GLUCOSE 162 (H) 02/16/2020    Lab Results  Component Value Date/Time   HGBA1C 5.9 (A) 08/08/2019 02:59 PM   HGBA1C 5.9 (A) 12/14/2018 11:50 AM   HGBA1C 6.1 (H) 01/20/2016 01:15 PM    Last diabetic Eye exam: No results found for: HMDIABEYEEXA  Last diabetic Foot exam: No results found for: HMDIABFOOTEX   No results found for: CHOL, HDL, LDLCALC, LDLDIRECT, TRIG, CHOLHDL  Hepatic Function Latest Ref Rng & Units 09/23/2019 09/28/2018 01/20/2016  Total Protein 6.5 - 8.1 g/dL 6.6 6.2(L) 7.3  Albumin 3.5 - 5.0 g/dL 3.8 3.8 3.8  AST 15 - 41 U/L 34 25 21  ALT 0 - 44 U/L 32 28 17  Alk Phosphatase 38 - 126 U/L 78 88 103  Total Bilirubin 0.3 - 1.2 mg/dL 0.6 0.3 0.3  Bilirubin, Direct 0.0 - 0.2 mg/dL 0.1 - -    No results found for: TSH, FREET4  CBC Latest Ref Rng & Units 02/16/2020 11/16/2019 11/15/2019  WBC 4.0 - 10.5 K/uL 6.4 10.8(H) 13.8(H)  Hemoglobin 12.0 - 15.0 g/dL 10.3(L) 11.6(L) 12.4  Hematocrit 36.0 - 46.0 % 32.9(L) 34.4(L) 36.8  Platelets 150 - 400 K/uL 191 199 189    No results found for: VD25OH  Clinical ASCVD: {YES/NO:21197} The ASCVD Risk score Summer Murphy DC Jr., et al., 2013) failed to calculate for the following reasons:   Cannot find a previous HDL lab  Cannot find a previous total cholesterol lab    Depression screen [facility] 04/03/2020 11/29/2019 08/08/2019  Decreased Interest 0 0 0  Down, Depressed, Hopeless 0 0 0  PHQ - 2 Score 0 0 0  Altered sleeping - - -  Tired, decreased energy - - -  Change in appetite - - -  Feeling bad or failure about yourself  - - -  Trouble concentrating - - -  Moving slowly or fidgety/restless - - -  Suicidal thoughts - - -  PHQ-9 Score - - -  Difficult doing work/chores - - -     ***Other: (CHADS2VASc if Afib, MMRC or CAT for COPD, ACT, DEXA)  Social History   Tobacco Use  Smoking  Status Never Smoker  Smokeless Tobacco Never Used   BP Readings from Last 3 Encounters:  06/06/20 138/88  04/30/20 (!) 152/92  04/12/20 128/66   Pulse Readings from Last 3 Encounters:  06/06/20 91  04/30/20 97  04/12/20 94   Wt Readings from Last 3 Encounters:  06/06/20 163 lb 12.8 oz (74.3 kg)  04/30/20 162 lb 6.4 oz (73.7 kg)  04/12/20 161 lb (73 kg)   BMI Readings from Last 3 Encounters:  06/06/20 29.02 kg/m  04/30/20 28.77 kg/m  04/12/20 28.52 kg/m    Assessment/Interventions: Review of patient past medical history, allergies, medications, health status, including review of consultants reports, laboratory and other test data, was performed as part of comprehensive evaluation and provision of chronic care management services.   SDOH:  (Social Determinants of Health) assessments and interventions performed: {yes/no:20286}  SDOH Screenings   Alcohol Screen: Not on file  Depression (PHQ2-9): Low Risk   . PHQ-2 Score: 0  Financial Resource Strain: Not on file  Food Insecurity: Not on file  Housing: Not on file  Physical Activity: Not on file  Social Connections: Not on file  Stress: Not on file  Tobacco Use: Low Risk   . Smoking Tobacco Use: Never Smoker  . Smokeless Tobacco Use: Never Used  Transportation Needs: Not on file    CCM Care Plan  Allergies  Allergen Reactions  . Librium [Chlordiazepoxide] Shortness Of Breath  . Metoclopramide Hives and Other (See Comments)    hallucinations   . Vanilla Shortness Of Breath  . Aspirin Hives  . Nsaids Rash    Rash/flares asthma issues.  . Azithromycin Other (See Comments)    Unsure of what the reaction was.  . Buprenorphine Hcl Other (See Comments)    Unsure of what the reaction was.   . Chlordiazepoxide Hcl Other (See Comments)    unsure  . Morphine Other (See Comments)    Unsure of reaction  . Sulfa Antibiotics     Other reaction(s): Unknown  . Tolmetin     Other Reaction: Allergy  . Amlodipine  Besylate Itching and Rash    arms, stomach and forehead  . Iron Nausea And Vomiting    Other reaction(s): Unknown    Medications Reviewed Today    Reviewed by Luiz Ochoa, NP (Nurse Practitioner) on 06/07/20 at 2023  Med List Status: <None>  Medication Order Taking? Sig Documenting Provider Last Dose Status Informant  Biotin 800 MCG TABS [id number] No Take 1 tablet (800 mcg total) by mouth daily. Luiz Ochoa, NP Taking Active   Brexpiprazole 4 MG TABS [id number] No Take 4 mg by mouth daily.  [provider] Taking Active Self  busPIRone (BUSPAR) 10 MG tablet [id number] No Take 10 mg by mouth  2 (two) times daily.  [provider] Taking Active Self  clotrimazole-betamethasone (LOTRISONE) cream [id number] Yes Apply 1 application topically 2 (two) times daily. Luiz Ochoa, NP  Active   diclofenac sodium (VOLTAREN) 1 % GEL [id number] No Apply 2 g topically in the morning, at noon, and at bedtime. [provider] Taking Active Self           Med Note Summer Murphy, Germaine Pomfret Jul 22, 2016  1:09 PM)    diltiazem (DILACOR XR) 180 MG 24 hr capsule [id number] No Take 1 capsule (180 mg total) by mouth daily. Lavera Guise, MD Taking Active   diphenhydrAMINE (BENADRYL) 25 MG tablet [id number] No Take 50 mg by mouth 2 (two) times daily as needed.  [provider] Taking Active Self  diphenoxylate-atropine (LOMOTIL) 2.5-0.025 MG tablet [id number] No Take 1 tablet by mouth 3 (three) times daily as needed for diarrhea or loose stools. Ronnell Freshwater, NP Taking Active Self  docusate sodium (COLACE) 100 MG capsule [id number] No Take 1 capsule (100 mg total) by mouth 2 (two) times daily. Duanne Guess, PA-C Taking Active   DULoxetine (CYMBALTA) 60 MG capsule [id number] No Take 60 mg by mouth daily. [provider] Taking Active Self  fluticasone furoate-vilanterol (BREO ELLIPTA) 200-25 MCG/INH AEPB [id number] No Inhale 1 puff into the lungs daily. Luiz Ochoa, NP Taking Active   gabapentin (NEURONTIN) 600 MG tablet [id number]  TAKE 1 TABLET(600 MG) BY MOUTH TWICE DAILY Lavera Guise, MD  Active   Ipratropium-Albuterol [facility] RESPIMAT) 20-100 MCG/ACT AERS respimat [id number] No INHALE 1 PUFF INTO THE LUNGS EVERY 6 HOURS Lavera Guise, MD Taking Active   ipratropium-albuterol (DUONEB) 0.5-2.5 (3) MG/3ML SOLN [id number] No Take 3 mLs by nebulization every 4 (four) hours as needed (for shortness of breath/wheezing). Kendell Bane, NP Taking Active Self  loratadine (CLARITIN) 10 MG tablet [id number]  Take 1 tablet (10 mg total) by mouth daily. Luiz Ochoa, NP  Active   LORazepam (ATIVAN) 1 MG tablet [id number] No Take 1 mg by mouth at bedtime as needed for sleep.  [provider] Taking Active Self  metoprolol tartrate (LOPRESSOR) 25 MG tablet [id number] No Take 1 tablet (25 mg total) by mouth 2 (two) times daily. Duanne Guess, PA-C Taking Active   montelukast (SINGULAIR) 10 MG tablet [id number]  Take 1 tablet (10 mg total) by mouth at bedtime. Luiz Ochoa, NP  Active   nystatin (MYCOSTATIN) 100000 UNIT/ML suspension [id number] Yes Take 5 mLs (500,000 Units total) by mouth 4 (four) times daily. Luiz Ochoa, NP  Active   ondansetron (ZOFRAN) 4 MG tablet [id number] No Take 1 tablet (4 mg total) by mouth every 6 (six) hours as needed for nausea. Duanne Guess, PA-C Taking Active   oxybutynin (DITROPAN) 5 MG tablet [id number] No Take 5 mg by mouth in the morning and at bedtime. [provider] Taking Active Self  oxyCODONE (OXY IR/ROXICODONE) 5 MG immediate release tablet [id number] No Take 1 tablet (5 mg total) by mouth in the morning and at bedtime. Luiz Ochoa, NP Taking Active   rOPINIRole (REQUIP) 0.5 MG tablet [id number] No Take two tablets po BID for restless legs. Luiz Ochoa, NP Taking Active   SPIRIVA RESPIMAT 1.25 MCG/ACT AERS [id number]  INHALE 2 PUFFS INTO THE LUNGS DAILY Lavera Guise, MD  Active    SUMAtriptan (IMITREX) 100 MG tablet [id number] No Take 1 tablet (100 mg total)  by mouth every 2 (two) hours as needed (for migraine headaches.). May repeat in 1 hours if headache persists or recurs. Ronnell Freshwater, NP Taking Active   traZODone (DESYREL) 150 MG tablet [id number] No Take 150-300 mg by mouth at bedtime.  [provider] Taking Active Self  valACYclovir (VALTREX) 500 MG tablet [id number] No Take 500 mg by mouth daily as needed (for fever blisters). [provider] Taking Active Self          Patient Active Problem List   Diagnosis Date Noted  . 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 Seasonal allergic rhinitis due to pollen 09/20/2019  . Callus of foot 08/13/2019  . Candidal vaginitis 08/13/2019  . Other symptoms and signs involving the nervous system 08/13/2019  . Encounter for general adult medical examination with abnormal findings 08/13/2019  . Restless leg syndrome 04/30/2019  . Type 2 diabetes mellitus with hyperglycemia ([place]) 04/02/2019  . Muscle cramps 04/02/2019  . Diastolic dysfunction 70/96/2836  . SOB (shortness of breath) 12/14/2018  . Hiatal hernia with GERD 12/14/2018  . Wheezing 12/14/2018  . Impaired fasting glucose 12/14/2018  . COPD with acute exacerbation ([place]) 09/28/2018  . Acute upper respiratory infection 08/25/2018  . Recurrent sinusitis 08/15/2018  . Prediabetes 08/15/2018  . Nonintractable headache 08/15/2018  . Dysuria 08/15/2018  . Intractable migraine without status migrainosus 04/22/2018  . Nausea 04/22/2018  . Polyneuropathy associated with underlying disease ([place]) 04/22/2018  . Midline low back pain without sciatica 04/22/2018  . Routine cervical smear 04/22/2018  . Essential hypertension 07/01/2017  . Acute bronchitis with asthma 07/01/2017  . Allergic rhinitis 07/01/2017  . Moderate asthma without complication 62/94/7654  . Severe recurrent major depression without psychotic features ([place]) 09/10/2014  .  COPD (chronic obstructive pulmonary disease) ([place]) 09/10/2014  . Calculi, ureter 04/26/2013  . Corneal graft malfunction 08/17/2012  . Calculus of kidney 04/26/2012  . Renal colic 65/05/5463  . Urge incontinence 04/26/2012  . Diaphragmatic hernia 10/27/2010  . Barrett esophagus 07/07/2010    Immunization History  Administered Date(s) Administered  . Influenza Inj Mdck Quad Pf 11/29/2019  . Influenza,inj,Quad PF,6+ Mos 01/21/2016, 03/12/2019  . Influenza-Unspecified 02/25/2017  . Pneumococcal Polysaccharide-23 01/21/2016    Conditions to be addressed/monitored:  HTN, COPD, Type II DM, Depression, RLS,   There are no care plans that you recently modified to display for this patient.    Medication Assistance: {MEDASSISTANCEINFO:25044}  Patient's preferred pharmacy is:  [facility], [place] [address] Phone: [phone] Fax: [place], [place] [address] [place] [address] Phone: [phone] Fax: [id number] [place], [place] [address] Phone: [phone] Fax: [phone]  Uses pill box? {Yes or If no, why not?:20788} Pt endorses ***% compliance  We discussed: {Pharmacy options:24294} Patient decided to: {US Pharmacy Plan:23885}  Care Plan and Follow Up Patient Decision:  {FOLLOWUP:24991}  Plan: {CM FOLLOW UP PLAN:25073}  ***  Current Barriers:  . {pharmacybarriers:24917} . ***  Pharmacist Clinical Goal(s):  Marland Kitchen Patient will {PHARMACYGOALCHOICES:24921} through collaboration with PharmD and provider.  . ***  Interventions: . 1:1 collaboration with Lavera Guise, MD regarding development and update of comprehensive plan of care as evidenced by provider attestation and  co-signature . Inter-disciplinary care team collaboration (see longitudinal plan  of care) . Comprehensive medication review performed; medication list updated in electronic medical record  Hypertension (BP goal {CHL HP UPSTREAM Pharmacist BP ranges:[phone]}) -{US controlled/uncontrolled:25276} -Current treatment: . *** -Medications previously tried: ***  -Current home readings: *** -Current dietary habits: *** -Current exercise habits: *** -{ACTIONS;DENIES/REPORTS:21021675::"Denies"} hypotensive/hypertensive symptoms -Educated on {CCM BP Counseling:25124} -Counseled to monitor BP at home ***, document, and provide log at future appointments -{CCMPHARMDINTERVENTION:25122}  Diabetes (A1c goal {A1c goals:23924}) -{US controlled/uncontrolled:25276} -Current medications: . *** -Medications previously tried: ***  -Current home glucose readings . fasting glucose: *** . post prandial glucose: *** -{ACTIONS;DENIES/REPORTS:21021675::"Denies"} hypoglycemic/hyperglycemic symptoms -Current meal patterns:  . breakfast: ***  . lunch: ***  . dinner: *** . snacks: *** . drinks: *** -Current exercise: *** -Educated on {CCM DM COUNSELING:25123} -Counseled to check feet daily and get yearly eye exams -{CCMPHARMDINTERVENTION:25122}  COPD (Goal: control symptoms and prevent exacerbations) -{US controlled/uncontrolled:25276} -Current treatment  . *** -Medications previously tried: ***  -Gold Grade: {CHL HP Upstream Pharm COPD Gold LDJTT:[id number]} -Current COPD Classification:  {CHL HP Upstream Pharm COPD Classification:[phone]} -MMRC/CAT score: *** -Pulmonary function testing: *** -Exacerbations requiring treatment in last 6 months: *** -Patient {Actions; denies-reports:120008} consistent use of maintenance inhaler -Frequency of rescue inhaler use: *** -Counseled on {CCMINHALERCOUNSELING:25121} -{CCMPHARMDINTERVENTION:25122}  Depression/Anxiety (Goal: ***) -{US  controlled/uncontrolled:25276} -Current treatment: . *** -Medications previously tried/failed: *** -PHQ9: *** -GAD7: *** -Connected with *** for mental health support -Educated on {CCM mental health counseling:25127} -{CCMPHARMDINTERVENTION:25122}  *** (Goal: ***) -{US controlled/uncontrolled:25276} -Current treatment  . *** -Medications previously tried: ***  -{CCMPHARMDINTERVENTION:25122}   Patient Goals/Self-Care Activities . Patient will:  - {pharmacypatientgoals:24919}  Follow Up Plan: {CM FOLLOW UP SPQZ:30076}

## 2020-07-09 ENCOUNTER — Ambulatory Visit: Payer: Medicare Other

## 2020-07-10 ENCOUNTER — Ambulatory Visit: Payer: Medicare Other

## 2020-07-10 ENCOUNTER — Ambulatory Visit: Payer: Self-pay | Admitting: Internal Medicine

## 2020-07-13 DIAGNOSIS — J449 Chronic obstructive pulmonary disease, unspecified: Secondary | ICD-10-CM | POA: Diagnosis not present

## 2020-07-13 DIAGNOSIS — M6281 Muscle weakness (generalized): Secondary | ICD-10-CM | POA: Diagnosis not present

## 2020-07-13 DIAGNOSIS — M978XXD Periprosthetic fracture around other internal prosthetic joint, subsequent encounter: Secondary | ICD-10-CM | POA: Diagnosis not present

## 2020-07-17 ENCOUNTER — Telehealth: Payer: Self-pay | Admitting: Internal Medicine

## 2020-07-17 NOTE — Progress Notes (Signed)
  Chronic Care Management   Note  07/17/2020 Name: Summer Murphy MRN: [id number] DOB: 1955-06-05  Summer Murphy is a 65 y.o. year old female who is a primary care patient of Lavera Guise, MD. I reached out to Waldon Merl by phone today in response to a referral sent by Ms. [place] PCP, Lavera Guise, MD.   Ms. Onstad was given information about Chronic Care Management services today including:  1. CCM service includes personalized support from designated clinical staff supervised by her physician, including individualized plan of care and coordination with other care providers 2. 24/7 contact phone numbers for assistance for urgent and routine care needs. 3. Service will only be billed when office clinical staff spend 20 minutes or more in a month to coordinate care. 4. Only one practitioner may furnish and bill the service in a calendar month. 5. The patient may stop CCM services at any time (effective at the end of the month) by phone call to the office staff.   Patient agreed to services and verbal consent obtained.   Follow up plan:   Carley Perdue [employer] Scheduler

## 2020-07-19 ENCOUNTER — Other Ambulatory Visit: Payer: Self-pay | Admitting: Internal Medicine

## 2020-07-19 DIAGNOSIS — J4541 Moderate persistent asthma with (acute) exacerbation: Secondary | ICD-10-CM

## 2020-07-20 DIAGNOSIS — I10 Essential (primary) hypertension: Secondary | ICD-10-CM | POA: Diagnosis not present

## 2020-07-20 DIAGNOSIS — J449 Chronic obstructive pulmonary disease, unspecified: Secondary | ICD-10-CM

## 2020-07-24 DIAGNOSIS — I10 Essential (primary) hypertension: Secondary | ICD-10-CM | POA: Diagnosis not present

## 2020-07-24 DIAGNOSIS — K219 Gastro-esophageal reflux disease without esophagitis: Secondary | ICD-10-CM | POA: Diagnosis not present

## 2020-07-24 DIAGNOSIS — Z79899 Other long term (current) drug therapy: Secondary | ICD-10-CM | POA: Diagnosis not present

## 2020-07-24 DIAGNOSIS — K449 Diaphragmatic hernia without obstruction or gangrene: Secondary | ICD-10-CM | POA: Diagnosis not present

## 2020-07-24 DIAGNOSIS — K2270 Barrett's esophagus without dysplasia: Secondary | ICD-10-CM | POA: Diagnosis not present

## 2020-07-24 DIAGNOSIS — Z886 Allergy status to analgesic agent status: Secondary | ICD-10-CM | POA: Diagnosis not present

## 2020-07-24 DIAGNOSIS — Z8679 Personal history of other diseases of the circulatory system: Secondary | ICD-10-CM | POA: Diagnosis not present

## 2020-07-24 DIAGNOSIS — J449 Chronic obstructive pulmonary disease, unspecified: Secondary | ICD-10-CM | POA: Diagnosis not present

## 2020-07-24 DIAGNOSIS — Z882 Allergy status to sulfonamides status: Secondary | ICD-10-CM | POA: Diagnosis not present

## 2020-07-24 DIAGNOSIS — I499 Cardiac arrhythmia, unspecified: Secondary | ICD-10-CM | POA: Diagnosis not present

## 2020-07-24 DIAGNOSIS — K222 Esophageal obstruction: Secondary | ICD-10-CM | POA: Diagnosis not present

## 2020-07-24 DIAGNOSIS — Z87442 Personal history of urinary calculi: Secondary | ICD-10-CM | POA: Diagnosis not present

## 2020-07-24 DIAGNOSIS — Z885 Allergy status to narcotic agent status: Secondary | ICD-10-CM | POA: Diagnosis not present

## 2020-08-07 ENCOUNTER — Ambulatory Visit (INDEPENDENT_AMBULATORY_CARE_PROVIDER_SITE_OTHER): Payer: Medicaid Other | Admitting: Internal Medicine

## 2020-08-07 ENCOUNTER — Other Ambulatory Visit: Payer: Self-pay

## 2020-08-07 DIAGNOSIS — J45909 Unspecified asthma, uncomplicated: Secondary | ICD-10-CM | POA: Diagnosis not present

## 2020-08-07 LAB — PULMONARY FUNCTION TEST

## 2020-08-08 ENCOUNTER — Telehealth: Payer: Self-pay | Admitting: Pharmacist

## 2020-08-08 NOTE — Progress Notes (Addendum)
Chronic Care Management Pharmacy Assistant   Name: Summer Murphy  MRN: [id number] DOB: [birth year]  Summer Murphy is an 65 y.o. year old female who presents for his initial CCM visit with the clinical pharmacist.  Reason for Encounter: Chart Prep   Conditions to be addressed/monitored:  HTN, COPD, GERD, Type 2 DM, Allergies  Primary concerns for visit include: HTN, Type 2 DM.  Recent office visits:  None since 07/04/20  Recent consult visits:  07/24/20 Rosario Adie, Lourena Simmonds, MD For Barrett's esophagus/EGD. No medication changes.  Hospital visits:  None since 07/04/20  Medications: Outpatient Encounter Medications as of 08/08/2020  Medication Sig   Biotin 800 MCG TABS Take 1 tablet (800 mcg total) by mouth daily.   Brexpiprazole 4 MG TABS Take 4 mg by mouth daily.    busPIRone (BUSPAR) 10 MG tablet Take 10 mg by mouth 2 (two) times daily.    clotrimazole-betamethasone (LOTRISONE) cream Apply 1 application topically 2 (two) times daily.   COMBIVENT RESPIMAT 20-100 MCG/ACT AERS respimat INHALE 1 PUFF INTO THE LUNGS EVERY 6 HOURS   diclofenac sodium (VOLTAREN) 1 % GEL Apply 2 g topically in the morning, at noon, and at bedtime.   diltiazem (DILACOR XR) 180 MG 24 hr capsule Take 1 capsule (180 mg total) by mouth daily.   diphenhydrAMINE (BENADRYL) 25 MG tablet Take 50 mg by mouth 2 (two) times daily as needed.    diphenoxylate-atropine (LOMOTIL) 2.5-0.025 MG tablet Take 1 tablet by mouth 3 (three) times daily as needed for diarrhea or loose stools.   docusate sodium (COLACE) 100 MG capsule Take 1 capsule (100 mg total) by mouth 2 (two) times daily.   DULoxetine (CYMBALTA) 60 MG capsule Take 60 mg by mouth daily.   fluticasone furoate-vilanterol (BREO ELLIPTA) 200-25 MCG/INH AEPB Inhale 1 puff into the lungs daily.   gabapentin (NEURONTIN) 600 MG tablet TAKE 1 TABLET(600 MG) BY MOUTH TWICE DAILY   ipratropium-albuterol (DUONEB) 0.5-2.5 (3) MG/3ML SOLN Take 3 mLs by  nebulization every 4 (four) hours as needed (for shortness of breath/wheezing).   loratadine (CLARITIN) 10 MG tablet Take 1 tablet (10 mg total) by mouth daily.   LORazepam (ATIVAN) 1 MG tablet Take 1 mg by mouth at bedtime as needed for sleep.    metoprolol tartrate (LOPRESSOR) 25 MG tablet Take 1 tablet (25 mg total) by mouth 2 (two) times daily.   montelukast (SINGULAIR) 10 MG tablet Take 1 tablet (10 mg total) by mouth at bedtime.   nystatin (MYCOSTATIN) 100000 UNIT/ML suspension Take 5 mLs (500,000 Units total) by mouth 4 (four) times daily.   ondansetron (ZOFRAN) 4 MG tablet Take 1 tablet (4 mg total) by mouth every 6 (six) hours as needed for nausea.   oxybutynin (DITROPAN) 5 MG tablet Take 5 mg by mouth in the morning and at bedtime.   oxyCODONE (OXY IR/ROXICODONE) 5 MG immediate release tablet Take 1 tablet (5 mg total) by mouth in the morning and at bedtime.   rOPINIRole (REQUIP) 0.5 MG tablet Take two tablets po BID for restless legs.   SPIRIVA RESPIMAT 1.25 MCG/ACT AERS INHALE 2 PUFFS INTO THE LUNGS DAILY   SUMAtriptan (IMITREX) 100 MG tablet Take 1 tablet (100 mg total) by mouth every 2 (two) hours as needed (for migraine headaches.). May repeat in 1 hours if headache persists or recurs.   traZODone (DESYREL) 150 MG tablet Take 150-300 mg by mouth at bedtime.    valACYclovir (VALTREX) 500 MG tablet Take 500  mg by mouth daily as needed (for fever blisters).   No facility-administered encounter medications on file as of 08/08/2020.   Patients initial questions have been completed since April 2022. This is a update on the patients office visits for the clinical pharmacist before her initial appointment.   Star Rating Drugs:Gabapentin 600 mg 30 DS 07/13/20  Follow-Up:Pharmacist Review   Charlann Lange, Daniel Clinical Pharmacist Assistant [phone]

## 2020-08-12 DIAGNOSIS — M978XXD Periprosthetic fracture around other internal prosthetic joint, subsequent encounter: Secondary | ICD-10-CM | POA: Diagnosis not present

## 2020-08-12 DIAGNOSIS — M6281 Muscle weakness (generalized): Secondary | ICD-10-CM | POA: Diagnosis not present

## 2020-08-12 DIAGNOSIS — J449 Chronic obstructive pulmonary disease, unspecified: Secondary | ICD-10-CM | POA: Diagnosis not present

## 2020-08-13 ENCOUNTER — Other Ambulatory Visit: Payer: Self-pay

## 2020-08-13 ENCOUNTER — Ambulatory Visit: Payer: Medicaid Other | Admitting: Pharmacist

## 2020-08-13 DIAGNOSIS — G2581 Restless legs syndrome: Secondary | ICD-10-CM

## 2020-08-13 DIAGNOSIS — E1165 Type 2 diabetes mellitus with hyperglycemia: Secondary | ICD-10-CM

## 2020-08-13 DIAGNOSIS — I10 Essential (primary) hypertension: Secondary | ICD-10-CM

## 2020-08-13 DIAGNOSIS — G43919 Migraine, unspecified, intractable, without status migrainosus: Secondary | ICD-10-CM

## 2020-08-13 DIAGNOSIS — F332 Major depressive disorder, recurrent severe without psychotic features: Secondary | ICD-10-CM

## 2020-08-13 DIAGNOSIS — J45909 Unspecified asthma, uncomplicated: Secondary | ICD-10-CM

## 2020-08-13 DIAGNOSIS — J441 Chronic obstructive pulmonary disease with (acute) exacerbation: Secondary | ICD-10-CM

## 2020-08-13 MED ORDER — SUMATRIPTAN SUCCINATE 100 MG PO TABS: 100.0000 mg | ORAL_TABLET | ORAL | 3 refills | Status: DC | PRN

## 2020-08-13 MED ORDER — VALACYCLOVIR HCL 500 MG PO TABS: 500.0000 mg | ORAL_TABLET | Freq: Every day | ORAL | 1 refills | Status: DC | PRN

## 2020-08-13 MED ORDER — OXYBUTYNIN CHLORIDE 5 MG PO TABS: 5.0000 mg | ORAL_TABLET | Freq: Two times a day (BID) | ORAL | 1 refills | Status: DC

## 2020-08-13 MED ORDER — ROPINIROLE HCL 0.5 MG PO TABS: ORAL_TABLET | ORAL | 1 refills | Status: DC

## 2020-08-13 MED ORDER — BREO ELLIPTA 200-25 MCG/INH IN AEPB: 1.0000 | INHALATION_SPRAY | Freq: Every day | RESPIRATORY_TRACT | 3 refills | Status: DC

## 2020-08-13 MED ORDER — DICLOFENAC SODIUM 1 % EX GEL: 4.0000 g | Freq: Four times a day (QID) | CUTANEOUS | 1 refills | Status: DC

## 2020-08-13 NOTE — Patient Instructions (Addendum)
Visit Information  Goals Addressed            This Visit's Progress   . Track and Manage My Symptoms-COPD       Timeframe:  Long-Range Goal Priority:  High Start Date:    08/13/20                         Expected End Date:    02/13/21                   Follow Up Date 11/13/20   - develop a rescue plan - eliminate symptom triggers at home - follow rescue plan if symptoms flare-up - keep follow-up appointments    Why is this important?    Tracking your symptoms and other information about your health helps your doctor plan your care.   Write down the symptoms, the time of day, what you were doing and what medicine you are taking.   You will soon learn how to manage your symptoms.     Notes: Use maintenance inhalers daily!      Patient Care Plan: General Pharmacy (Adult)    Problem Identified: HTN, COPD, Diabetes, RLS, Depression, Chronic Pain, Urge Incontinence   Priority: High  Onset Date: 08/13/2020    Long-Range Goal: Patient-Specific Goal   Start Date: 08/13/2020  Expected End Date: 02/13/2021  This Visit's Progress: On track  Priority: High  Note:   Current Barriers:  . Unable to independently monitor therapeutic efficacy . Does not adhere to prescribed medication regimen  Pharmacist Clinical Goal(s):  Marland Kitchen Patient will achieve adherence to monitoring guidelines and medication adherence to achieve therapeutic efficacy . maintain control of BP as evidenced by RPM  . adhere to plan to optimize therapeutic regimen for COPD as evidenced by report of adherence to recommended medication management changes . contact provider office for questions/concerns as evidenced notation of same in electronic health record through collaboration with PharmD and provider.   Interventions: . 1:1 collaboration with Lavera Guise, MD regarding development and update of comprehensive plan of care as evidenced by provider attestation and co-signature . Inter-disciplinary care team  collaboration (see longitudinal plan of care) . Comprehensive medication review performed; medication list updated in electronic medical record  Hypertension (BP goal <140/90) -Controlled -Current treatment: . Diltiazem 180mg  daily -Medications previously tried: Toprol XL - patient reports not taking tartrate either  -Current home readings: not checking  -Current exercise habits: none currently due to knee pain -Denies hypotensive/hypertensive symptoms -Educated on BP goals and benefits of medications for prevention of heart attack, stroke and kidney damage; Exercise goal of 150 minutes per week; Importance of home blood pressure monitoring; Symptoms of hypotension and importance of maintaining adequate hydration; -Counseled to monitor BP at home daily, document, and provide log at future appointments -She does not have BP cuff at home, patient has expressed interest in checking blood pressure.  She has not taken metoprolol in quite a while because she was unsure what it was for.  Does report some rapid heart beats when she lays down at night.  I think it would greatly benefit her form having an RPM device at home to check blood pressure.   -Recommended to continue current medication Collaborated with PCP to determine if it is necessary to restart metoprolol at this time.  Will follow up with patient.  Diabetes (A1c goal <6.5%) -Controlled -Current medications: . None -Medications previously tried: none noted  -Current home glucose  readings . fasting glucose: not checking . post prandial glucose: not checking -Denies hypoglycemic/hyperglycemic symptoms -Current exercise: non -Educated on A1c and blood sugar goals; Complications of diabetes including kidney damage, retinal damage, and cardiovascular disease; Exercise goal of 150 minutes per week; -Counseled to check feet daily and get yearly eye exams -Recommended continue current management strategies  COPD (Goal: control  symptoms and prevent exacerbations) -Controlled -Current treatment  . Spiriva 1.40mcg/act 2 puffs daily . Combivent Respimat 20-182mcg/act q6h prn . Breo Ellipta 200-49mcg 1 puff daily -Medications previously tried: none noted    -Exacerbations requiring treatment in last 6 months: none -Patient denies consistent use of maintenance inhaler - "makes her tongue white" -Frequency of rescue inhaler use: as needed -Counseled on Proper inhaler technique; Benefits of consistent maintenance inhaler use When to use rescue inhaler Differences between maintenance and rescue inhalers  Rinsing mouth out after use to avoid thrush -Recommended to continue current medication Recommended strict use of maintenance inhaler daily to prevent exacerbations or SOB, patient agreeable to try  RLS (Goal: Minimize symptoms) -Controlled -Current treatment  . Ropinirole 0.5mg  two tablets po BID -Medications previously tried: none noted -Denies symptoms when taking this medicaiton  -Recommended to continue current medication  Urge Incontinence (Goal: Control bladder) -Not ideally controlled -Current treatment  . None currently - patient was not taking Oxybutynin 5mg  at all -Medications previously tried: Oxybutynin - ran out and did not refill -Reports she is having difficulty controlling her bladder -Recommended she restart this medication - will request refill sent in to her preferred pharmacy.  Depression (Goal: Minimize symptoms) -Controlled -Current treatment  . Duloxetine 60mg  daily . Rexulti 4mg  daily . Trazodone 150mg  hs -Medications previously tried: citalopram, amitriptyline  -Reports these medications are working great, getting adequate sleep -Recommended to continue current medication   Patient Goals/Self-Care Activities . Patient will:  - take medications as prescribed focus on medication adherence by pill count check blood pressure daily, document, and provide at future  appointments target a minimum of 150 minutes of moderate intensity exercise weekly  Follow Up Plan: The care management team will reach out to the patient again over the next 120 days.        Summer Murphy was given information about Chronic Care Management services today including:  1. CCM service includes personalized support from designated clinical staff supervised by her physician, including individualized plan of care and coordination with other care providers 2. 24/7 contact phone numbers for assistance for urgent and routine care needs. 3. Standard insurance, coinsurance, copays and deductibles apply for chronic care management only during months in which we provide at least 20 minutes of these services. Most insurances cover these services at 100%, however patients may be responsible for any copay, coinsurance and/or deductible if applicable. This service may help you avoid the need for more expensive face-to-face services. 4. Only one practitioner may furnish and bill the service in a calendar month. 5. The patient may stop CCM services at any time (effective at the end of the month) by phone call to the office staff.  Patient agreed to services and verbal consent obtained.   The patient verbalized understanding of instructions, educational materials, and care plan provided today and agreed to receive a mailed copy of patient instructions, educational materials, and care plan.  Telephone follow up appointment with pharmacy team member scheduled for: 4 months  Edythe Clarity, [facility]  COPD and Physical Activity Chronic obstructive pulmonary disease (COPD) is a long-term (chronic) condition that  affects the lungs. COPD is a general term that can be used to describe many different lung problems that cause lung swelling (inflammation) and limit airflow, including chronic bronchitis and emphysema. The main symptom of COPD is shortness of breath, which makes it harder to do even simple  tasks. This can also make it harder to exercise and be active. Talk with your health care provider about treatments to help you breathe better and actions you can take to prevent breathing problems during physical activity. What are the benefits of exercising with COPD? Exercising regularly is an important part of a healthy lifestyle. You can still exercise and do physical activities even though you have COPD. Exercise and physical activity improve your shortness of breath by increasing blood flow (circulation). This causes your heart to pump more oxygen through your body. Moderate exercise can improve your:  Oxygen use.  Energy level.  Shortness of breath.  Strength in your breathing muscles.  Heart health.  Sleep.  Self-esteem and feelings of self-worth.  Depression, stress, and anxiety levels. Exercise can benefit everyone with COPD. The severity of your disease may affect how hard you can exercise, especially at first, but everyone can benefit. Talk with your health care provider about how much exercise is safe for you, and which activities and exercises are safe for you.   What actions can I take to prevent breathing problems during physical activity?  Sign up for a pulmonary rehabilitation program. This type of program may include: ? Education about lung diseases. ? Exercise classes that teach you how to exercise and be more active while improving your breathing. This usually involves:  Exercise using your lower extremities, such as a stationary bicycle.  About 30 minutes of exercise, 2 to 5 times per week, for 6 to 12 weeks  Strength training, such as push ups or leg lifts. ? Nutrition education. ? Group classes in which you can talk with others who also have COPD and learn ways to manage stress.  If you use an oxygen tank, you should use it while you exercise. Work with your health care provider to adjust your oxygen for your physical activity. Your resting flow rate is  different from your flow rate during physical activity.  While you are exercising: ? Take slow breaths. ? Pace yourself and do not try to go too fast. ? Purse your lips while breathing out. Pursing your lips is similar to a kissing or whistling position. ? If doing exercise that uses a quick burst of effort, such as weight lifting:  Breathe in before starting the exercise.  Breathe out during the hardest part of the exercise (such as raising the weights). Where to find support You can find support for exercising with COPD from:  Your health care provider.  A pulmonary rehabilitation program.  Your local health department or community health programs.  Support groups, online or in-person. Your health care provider may be able to recommend support groups. Where to find more information You can find more information about exercising with COPD from:  American Lung Association: ClassInsider.se.  COPD Foundation: https://www.rivera.net/. Contact a health care provider if:  Your symptoms get worse.  You have chest pain.  You have nausea.  You have a fever.  You have trouble talking or catching your breath.  You want to start a new exercise program or a new activity. Summary  COPD is a general term that can be used to describe many different lung problems that cause lung swelling (  inflammation) and limit airflow. This includes chronic bronchitis and emphysema.  Exercise and physical activity improve your shortness of breath by increasing blood flow (circulation). This causes your heart to provide more oxygen to your body.  Contact your health care provider before starting any exercise program or new activity. Ask your health care provider what exercises and activities are safe for you. This information is not intended to replace advice given to you by your health care provider. Make sure you discuss any questions you have with your health care provider. Document Revised: 06/29/2018  Document Reviewed: 04/01/2017 Elsevier Patient Education  2021 [employer].

## 2020-08-13 NOTE — Progress Notes (Signed)
Chronic Care Management Pharmacy Note  08/13/2020 Name:  Summer Murphy MRN:  [id number] DOB:  Oct 04, 1955  Subjective: Summer Murphy is an 65 y.o. year old female who is a primary patient of [name], MD.  The CCM team was consulted for assistance with disease management and care coordination needs.    Engaged with patient face to face for initial visit in response to provider referral for pharmacy case management and/or care coordination services.   Consent to Services:  The patient was given the following information about Chronic Care Management services today, agreed to services, and gave verbal consent: 1. CCM service includes personalized support from designated clinical staff supervised by the primary care provider, including individualized plan of care and coordination with other care providers 2. 24/7 contact phone numbers for assistance for urgent and routine care needs. 3. Service will only be billed when office clinical staff spend 20 minutes or more in a month to coordinate care. 4. Only one practitioner may furnish and bill the service in a calendar month. 5.The patient may stop CCM services at any time (effective at the end of the month) by phone call to the office staff. 6. The patient will be responsible for cost sharing (co-pay) of up to 20% of the service fee (after annual deductible is met). Patient agreed to services and consent obtained.  Patient Care Team: Lavera Guise, MD as PCP - General (Internal Medicine) Edythe Clarity, [place] as Pharmacist (Pharmacist)  Recent office visits:  None since 07/04/20  Recent consult visits:  07/24/20 Rosario Adie, Lourena Simmonds, MDFor Barrett's esophagus/EGD. No medication changes.  Hospital visits:  None since 07/04/20  Objective:  Lab Results  Component Value Date   CREATININE 1.07 (H) 02/16/2020   BUN 12 02/16/2020   GFRNONAA 58 (L) 02/16/2020   GFRAA >60 11/16/2019   NA 137 02/16/2020   K 2.9 (L) 02/16/2020    CALCIUM 8.3 (L) 02/16/2020   CO2 30 02/16/2020   GLUCOSE 162 (H) 02/16/2020    Lab Results  Component Value Date/Time   HGBA1C 5.9 (A) 08/08/2019 02:59 PM   HGBA1C 5.9 (A) 12/14/2018 11:50 AM   HGBA1C 6.1 (H) 01/20/2016 01:15 PM    Last diabetic Eye exam: No results found for: HMDIABEYEEXA  Last diabetic Foot exam: No results found for: HMDIABFOOTEX   No results found for: CHOL, HDL, LDLCALC, LDLDIRECT, TRIG, CHOLHDL  Hepatic Function Latest Ref Rng & Units 09/23/2019 09/28/2018 01/20/2016  Total Protein 6.5 - 8.1 g/dL 6.6 6.2(L) 7.3  Albumin 3.5 - 5.0 g/dL 3.8 3.8 3.8  AST 15 - 41 U/L 34 25 21  ALT 0 - 44 U/L 32 28 17  Alk Phosphatase 38 - 126 U/L 78 88 103  Total Bilirubin 0.3 - 1.2 mg/dL 0.6 0.3 0.3  Bilirubin, Direct 0.0 - 0.2 mg/dL 0.1 - -    No results found for: TSH, FREET4  CBC Latest Ref Rng & Units 02/16/2020 11/16/2019 11/15/2019  WBC 4.0 - 10.5 K/uL 6.4 10.8(H) 13.8(H)  Hemoglobin 12.0 - 15.0 g/dL 10.3(L) 11.6(L) 12.4  Hematocrit 36.0 - 46.0 % 32.9(L) 34.4(L) 36.8  Platelets 150 - 400 K/uL 191 199 189    No results found for: VD25OH  Clinical ASCVD: No  The ASCVD Risk score [name] DC Jr., et al., 2013) failed to calculate for the following reasons:   Cannot find a previous HDL lab   Cannot find a previous total cholesterol lab    Depression screen PHQ  2/9 04/03/2020 11/29/2019 08/08/2019  Decreased Interest 0 0 0  Down, Depressed, Hopeless 0 0 0  PHQ - 2 Score 0 0 0  Altered sleeping - - -  Tired, decreased energy - - -  Change in appetite - - -  Feeling bad or failure about yourself  - - -  Trouble concentrating - - -  Moving slowly or fidgety/restless - - -  Suicidal thoughts - - -  PHQ-9 Score - - -  Difficult doing work/chores - - -  Some recent data might be hidden      Social History   Tobacco Use  Smoking Status Never Smoker  Smokeless Tobacco Never Used   BP Readings from Last 3 Encounters:  06/06/20 138/88  04/30/20 (!) 152/92  04/12/20  128/66   Pulse Readings from Last 3 Encounters:  06/06/20 91  04/30/20 97  04/12/20 94   Wt Readings from Last 3 Encounters:  06/06/20 163 lb 12.8 oz (74.3 kg)  04/30/20 162 lb 6.4 oz (73.7 kg)  04/12/20 161 lb (73 kg)   BMI Readings from Last 3 Encounters:  06/06/20 29.02 kg/m  04/30/20 28.77 kg/m  04/12/20 28.52 kg/m    Assessment/Interventions: Review of patient past medical history, allergies, medications, health status, including review of consultants reports, laboratory and other test data, was performed as part of comprehensive evaluation and provision of chronic care management services.   SDOH:  (Social Determinants of Health) assessments and interventions performed: Yes   Financial Resource Strain: Not on file    SDOH Screenings   Alcohol Screen: Not on file  Depression (PHQ2-9): Low Risk   . PHQ-2 Score: 0  Financial Resource Strain: Not on file  Food Insecurity: Not on file  Housing: Not on file  Physical Activity: Not on file  Social Connections: Not on file  Stress: Not on file  Tobacco Use: Low Risk   . Smoking Tobacco Use: Never Smoker  . Smokeless Tobacco Use: Never Used  Transportation Needs: Not on file    CCM Care Plan  Allergies  Allergen Reactions  . Librium [Chlordiazepoxide] Shortness Of Breath  . Metoclopramide Hives and Other (See Comments)    hallucinations   . Vanilla Shortness Of Breath  . Aspirin Hives  . Nsaids Rash    Rash/flares asthma issues.  . Azithromycin Other (See Comments)    Unsure of what the reaction was.  . Buprenorphine Hcl Other (See Comments)    Unsure of what the reaction was.   . Chlordiazepoxide Hcl Other (See Comments)    unsure  . Morphine Other (See Comments)    Unsure of reaction  . Sulfa Antibiotics     Other reaction(s): Unknown  . Tolmetin     Other Reaction: Allergy  . Amlodipine Besylate Itching and Rash    arms, stomach and forehead  . Iron Nausea And Vomiting    Other reaction(s):  Unknown    Medications Reviewed Today    Reviewed by Luiz Ochoa, NP (Nurse Practitioner) on 06/07/20 at 2023  Med List Status: <None>  Medication Order Taking? Sig Documenting Provider Last Dose Status Informant  Biotin 800 MCG TABS [id number] No Take 1 tablet (800 mcg total) by mouth daily. Luiz Ochoa, NP Taking Active   Brexpiprazole 4 MG TABS [id number] No Take 4 mg by mouth daily.  [provider] Taking Active Self  busPIRone (BUSPAR) 10 MG tablet [id number] No Take 10 mg by mouth 2 (two) times daily.  [provider] Taking Active Self  clotrimazole-betamethasone (LOTRISONE) cream [id number] Yes Apply 1 application topically 2 (two) times daily. Luiz Ochoa, NP  Active   diclofenac sodium (VOLTAREN) 1 % GEL [id number] No Apply 2 g topically in the morning, at noon, and at bedtime. [provider] Taking Active Self           Med Note [name], Germaine Pomfret Jul 22, 2016  1:09 PM)    diltiazem (DILACOR XR) 180 MG 24 hr capsule [id number] No Take 1 capsule (180 mg total) by mouth daily. Lavera Guise, MD Taking Active   diphenhydrAMINE (BENADRYL) 25 MG tablet [id number] No Take 50 mg by mouth 2 (two) times daily as needed.  [provider] Taking Active Self  diphenoxylate-atropine (LOMOTIL) 2.5-0.025 MG tablet [id number] No Take 1 tablet by mouth 3 (three) times daily as needed for diarrhea or loose stools. Ronnell Freshwater, NP Taking Active Self  docusate sodium (COLACE) 100 MG capsule [id number] No Take 1 capsule (100 mg total) by mouth 2 (two) times daily. Duanne Guess, PA-C Taking Active   DULoxetine (CYMBALTA) 60 MG capsule [id number] No Take 60 mg by mouth daily. [provider] Taking Active Self  fluticasone furoate-vilanterol (BREO ELLIPTA) 200-25 MCG/INH AEPB [id number] No Inhale 1 puff into the lungs daily. Luiz Ochoa, NP Taking Active   gabapentin (NEURONTIN) 600 MG tablet [id number]  TAKE 1 TABLET(600 MG) BY MOUTH TWICE  DAILY Lavera Guise, MD  Active   Ipratropium-Albuterol [facility] RESPIMAT) 20-100 MCG/ACT AERS respimat [id number] No INHALE 1 PUFF INTO THE LUNGS EVERY 6 HOURS Lavera Guise, MD Taking Active   ipratropium-albuterol (DUONEB) 0.5-2.5 (3) MG/3ML SOLN [id number] No Take 3 mLs by nebulization every 4 (four) hours as needed (for shortness of breath/wheezing). Kendell Bane, NP Taking Active Self  loratadine (CLARITIN) 10 MG tablet [id number]  Take 1 tablet (10 mg total) by mouth daily. Luiz Ochoa, NP  Active   LORazepam (ATIVAN) 1 MG tablet [id number] No Take 1 mg by mouth at bedtime as needed for sleep.  [provider] Taking Active Self  metoprolol tartrate (LOPRESSOR) 25 MG tablet [id number] No Take 1 tablet (25 mg total) by mouth 2 (two) times daily. Duanne Guess, PA-C Taking Active   montelukast (SINGULAIR) 10 MG tablet [id number]  Take 1 tablet (10 mg total) by mouth at bedtime. Luiz Ochoa, NP  Active   nystatin (MYCOSTATIN) 100000 UNIT/ML suspension [id number] Yes Take 5 mLs (500,000 Units total) by mouth 4 (four) times daily. Luiz Ochoa, NP  Active   ondansetron (ZOFRAN) 4 MG tablet [id number] No Take 1 tablet (4 mg total) by mouth every 6 (six) hours as needed for nausea. Duanne Guess, PA-C Taking Active   oxybutynin (DITROPAN) 5 MG tablet [id number] No Take 5 mg by mouth in the morning and at bedtime. [provider] Taking Active Self  oxyCODONE (OXY IR/ROXICODONE) 5 MG immediate release tablet [id number] No Take 1 tablet (5 mg total) by mouth in the morning and at bedtime. Luiz Ochoa, NP Taking Active   rOPINIRole (REQUIP) 0.5 MG tablet [id number] No Take two tablets po BID for restless legs. Luiz Ochoa, NP Taking Active   SPIRIVA RESPIMAT 1.25 MCG/ACT AERS [id number]  INHALE 2 PUFFS INTO THE LUNGS DAILY Lavera Guise, MD  Active   SUMAtriptan (IMITREX) 100 MG tablet [id number] No Take 1 tablet (100 mg total) by mouth every 2 (two) hours as needed  (  for migraine headaches.). May repeat in 1 hours if headache persists or recurs. Ronnell Freshwater, NP Taking Active   traZODone (DESYREL) 150 MG tablet [id number] No Take 150-300 mg by mouth at bedtime.  [provider] Taking Active Self  valACYclovir (VALTREX) 500 MG tablet [id number] No Take 500 mg by mouth daily as needed (for fever blisters). [provider] Taking Active Self          Patient Active Problem List   Diagnosis Date Noted  . 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 Seasonal allergic rhinitis due to pollen 09/20/2019  . Callus of foot 08/13/2019  . Candidal vaginitis 08/13/2019  . Other symptoms and signs involving the nervous system 08/13/2019  . Encounter for general adult medical examination with abnormal findings 08/13/2019  . Restless leg syndrome 04/30/2019  . Type 2 diabetes mellitus with hyperglycemia ([place]) 04/02/2019  . Muscle cramps 04/02/2019  . Diastolic dysfunction 50/27/7412  . SOB (shortness of breath) 12/14/2018  . Hiatal hernia with GERD 12/14/2018  . Wheezing 12/14/2018  . Impaired fasting glucose 12/14/2018  . COPD with acute exacerbation ([place]) 09/28/2018  . Acute upper respiratory infection 08/25/2018  . Recurrent sinusitis 08/15/2018  . Prediabetes 08/15/2018  . Nonintractable headache 08/15/2018  . Dysuria 08/15/2018  . Intractable migraine without status migrainosus 04/22/2018  . Nausea 04/22/2018  . Polyneuropathy associated with underlying disease ([place]) 04/22/2018  . Midline low back pain without sciatica 04/22/2018  . Routine cervical smear 04/22/2018  . Essential hypertension 07/01/2017  . Acute bronchitis with asthma 07/01/2017  . Allergic rhinitis 07/01/2017  . Moderate asthma without complication 87/86/7672  . Severe recurrent major depression without psychotic features ([place]) 09/10/2014  . COPD (chronic obstructive pulmonary disease) ([place]) 09/10/2014  . Calculi, ureter 04/26/2013  . Corneal graft  malfunction 08/17/2012  . Calculus of kidney 04/26/2012  . Renal colic 09/47/0962  . Urge incontinence 04/26/2012  . Diaphragmatic hernia 10/27/2010  . Barrett esophagus 07/07/2010    Immunization History  Administered Date(s) Administered  . Influenza Inj Mdck Quad Pf 11/29/2019  . Influenza,inj,Quad PF,6+ Mos 01/21/2016, 03/12/2019  . Influenza-Unspecified 02/25/2017  . Pneumococcal Polysaccharide-23 01/21/2016    Conditions to be addressed/monitored:  HTN, COPD, Diabetes, RLS, Depression, Chronic Pain, Urge Incontinence  Care Plan : General Pharmacy (Adult)  Updates made by Edythe Clarity, RPH since 08/13/2020 12:00 AM    Problem: HTN, COPD, Diabetes, RLS, Depression, Chronic Pain, Urge Incontinence   Priority: High  Onset Date: 08/13/2020    Long-Range Goal: Patient-Specific Goal   Start Date: 08/13/2020  Expected End Date: 02/13/2021  This Visit's Progress: On track  Priority: High  Note:   Current Barriers:  . Unable to independently monitor therapeutic efficacy . Does not adhere to prescribed medication regimen  Pharmacist Clinical Goal(s):  Marland Kitchen Patient will achieve adherence to monitoring guidelines and medication adherence to achieve therapeutic efficacy . maintain control of BP as evidenced by RPM  . adhere to plan to optimize therapeutic regimen for COPD as evidenced by report of adherence to recommended medication management changes . contact provider office for questions/concerns as evidenced notation of same in electronic health record through collaboration with PharmD and provider.   Interventions: . 1:1 collaboration with Lavera Guise, MD regarding development and update of comprehensive plan of care as evidenced by provider attestation and co-signature . Inter-disciplinary care team collaboration (see longitudinal plan of care) . Comprehensive medication review performed; medication list updated in electronic medical record  Hypertension (BP  goal  <140/90) -Controlled -Current treatment: . Diltiazem 137m daily -Medications previously tried: Toprol XL - patient reports not taking tartrate either  -Current home readings: not checking  -Current exercise habits: none currently due to knee pain -Denies hypotensive/hypertensive symptoms -Educated on BP goals and benefits of medications for prevention of heart attack, stroke and kidney damage; Exercise goal of 150 minutes per week; Importance of home blood pressure monitoring; Symptoms of hypotension and importance of maintaining adequate hydration; -Counseled to monitor BP at home daily, document, and provide log at future appointments -She does not have BP cuff at home, patient has expressed interest in checking blood pressure.  She has not taken metoprolol in quite a while because she was unsure what it was for.  Does report some rapid heart beats when she lays down at night.  I think it would greatly benefit her form having an RPM device at home to check blood pressure.   -Recommended to continue current medication Collaborated with PCP to determine if it is necessary to restart metoprolol at this time.  Will follow up with patient.  Diabetes (A1c goal <6.5%) -Controlled -Current medications: . None -Medications previously tried: none noted  -Current home glucose readings . fasting glucose: not checking . post prandial glucose: not checking -Denies hypoglycemic/hyperglycemic symptoms -Current exercise: non -Educated on A1c and blood sugar goals; Complications of diabetes including kidney damage, retinal damage, and cardiovascular disease; Exercise goal of 150 minutes per week; -Counseled to check feet daily and get yearly eye exams -Recommended continue current management strategies  COPD (Goal: control symptoms and prevent exacerbations) -Controlled -Current treatment  . Spiriva 1.218m/act 2 puffs daily . Combivent Respimat 20-10068mact q6h prn . Breo Ellipta 200-44m46m1 puff daily -Medications previously tried: none noted    -Exacerbations requiring treatment in last 6 months: none -Patient denies consistent use of maintenance inhaler - "makes her tongue white" -Frequency of rescue inhaler use: as needed -Counseled on Proper inhaler technique; Benefits of consistent maintenance inhaler use When to use rescue inhaler Differences between maintenance and rescue inhalers  Rinsing mouth out after use to avoid thrush -Recommended to continue current medication Recommended strict use of maintenance inhaler daily to prevent exacerbations or SOB, patient agreeable to try  RLS (Goal: Minimize symptoms) -Controlled -Current treatment  . Ropinirole 0.5mg 73m tablets po BID -Medications previously tried: none noted -Denies symptoms when taking this medicaiton  -Recommended to continue current medication  Urge Incontinence (Goal: Control bladder) -Not ideally controlled -Current treatment  . None currently - patient was not taking Oxybutynin 5mg a95mll -Medications previously tried: Oxybutynin - ran out and did not refill -Reports she is having difficulty controlling her bladder -Recommended she restart this medication - will request refill sent in to her preferred pharmacy.  Depression (Goal: Minimize symptoms) -Controlled -Current treatment  . Duloxetine 60mg d20m . Rexulti 4mg dai71m. Trazodone 150mg hs 34mications previously tried: citalopram, amitriptyline  -Reports these medications are working great, getting adequate sleep -Recommended to continue current medication   Patient Goals/Self-Care Activities . Patient will:  - take medications as prescribed focus on medication adherence by pill count check blood pressure daily, document, and provide at future appointments target a minimum of 150 minutes of moderate intensity exercise weekly  Follow Up Plan: The care management team will reach out to the patient again over the next 120 days.          Medication Assistance: None required.  Patient affirms current coverage meets needs.  Patient's  preferred pharmacy is:  [facility], [address] [place] [address] Phone: [phone] Fax: [place], [place] - [address] [place] [address] Phone: [phone] Fax: [facility] [id number] [place], [place] [address] Phone: [phone] Fax: [phone]  Uses pill box? Yes Pt endorses 100% compliance  We discussed: Benefits of medication synchronization, packaging and delivery as well as enhanced pharmacist oversight with [employer]. Patient decided to: Continue current medication management strategy  Care Plan and Follow Up Patient Decision:  Patient agrees to Care Plan and Follow-up.  Plan: The care management team will reach out to the patient again over the next 120 days.  Beverly Milch, PharmD Clinical Pharmacist [facility] [phone]

## 2020-08-20 ENCOUNTER — Ambulatory Visit (INDEPENDENT_AMBULATORY_CARE_PROVIDER_SITE_OTHER): Payer: Medicaid Other | Admitting: Nurse Practitioner

## 2020-08-20 ENCOUNTER — Encounter: Payer: Self-pay | Admitting: Nurse Practitioner

## 2020-08-20 VITALS — Temp 99.8°F | Ht 63.0 in | Wt 163.0 lb

## 2020-08-20 DIAGNOSIS — R059 Cough, unspecified: Secondary | ICD-10-CM

## 2020-08-20 DIAGNOSIS — R509 Fever, unspecified: Secondary | ICD-10-CM

## 2020-08-20 DIAGNOSIS — J45909 Unspecified asthma, uncomplicated: Secondary | ICD-10-CM

## 2020-08-20 DIAGNOSIS — J209 Acute bronchitis, unspecified: Secondary | ICD-10-CM | POA: Diagnosis not present

## 2020-08-20 DIAGNOSIS — J449 Chronic obstructive pulmonary disease, unspecified: Secondary | ICD-10-CM | POA: Diagnosis not present

## 2020-08-20 DIAGNOSIS — I10 Essential (primary) hypertension: Secondary | ICD-10-CM | POA: Diagnosis not present

## 2020-08-20 MED ORDER — AMOXICILLIN-POT CLAVULANATE 875-125 MG PO TABS: 1.0000 | ORAL_TABLET | Freq: Two times a day (BID) | ORAL | 0 refills | Status: AC

## 2020-08-20 NOTE — Progress Notes (Signed)
[facility] [place], [place] [address]  Internal MEDICINE  Telephone Visit  Patient Name: Summer Murphy  [id number]  [id number]  Date of Service: 08/21/2020  I connected with the patient at 3:40 PM by telephone and verified the patients identity using two identifiers.   I discussed the limitations, risks, security and privacy concerns of performing an evaluation and management service by telephone and the availability of in person appointments. I also discussed with the patient that there may be a patient responsible charge related to the service.  The patient expressed understanding and agrees to proceed.    Chief Complaint  Patient presents with  . Telephone Screen    Sore throat, ear pain, green mucus, headaches  . Telephone Assessment    [phone] virtural  . Asthma  . Acute Visit    HPI Summer Murphy presents for a virtual audio acute sick visit. She was unable to get the video to work so the visit was done over the phone. She is experiencing sore throat, ear pain, green mucous and headaches. She has a history of asthma. She is complaining of cough, shortness of breath, wheezing, and chest tightness. She is coughing up green sputum. With history of asthma, she is at risk for contracting COVID and developing more serious lower respiratory infection. Unable to adequately assess status of asthma over the phone. She also does not have a home COVID test available.     Current Medication: Outpatient Encounter Medications as of 08/20/2020  Medication Sig  . amoxicillin-clavulanate (AUGMENTIN) 875-125 MG tablet Take 1 tablet by mouth 2 (two) times daily for 10 days.  . Biotin 800 MCG TABS Take 1 tablet (800 mcg total) by mouth daily.  . Brexpiprazole 4 MG TABS Take 4 mg by mouth daily.   . busPIRone (BUSPAR) 10 MG tablet Take 10 mg by mouth 2 (two) times daily.   . clotrimazole-betamethasone (LOTRISONE) cream Apply 1 application topically 2 (two) times daily.   . COMBIVENT RESPIMAT 20-100 MCG/ACT AERS respimat INHALE 1 PUFF INTO THE LUNGS EVERY 6 HOURS  . diclofenac sodium (VOLTAREN) 1 % GEL Apply 2 g topically in the morning, at noon, and at bedtime.  . diclofenac Sodium (VOLTAREN) 1 % GEL Apply 4 g topically 4 (four) times daily.  Marland Kitchen diltiazem (DILACOR XR) 180 MG 24 hr capsule Take 1 capsule (180 mg total) by mouth daily.  . diphenhydrAMINE (BENADRYL) 25 MG tablet Take 50 mg by mouth 2 (two) times daily as needed.   . diphenoxylate-atropine (LOMOTIL) 2.5-0.025 MG tablet Take 1 tablet by mouth 3 (three) times daily as needed for diarrhea or loose stools.  . docusate sodium (COLACE) 100 MG capsule Take 1 capsule (100 mg total) by mouth 2 (two) times daily.  . DULoxetine (CYMBALTA) 60 MG capsule Take 60 mg by mouth daily.  . fluticasone furoate-vilanterol (BREO ELLIPTA) 200-25 MCG/INH AEPB Inhale 1 puff into the lungs daily.  Marland Kitchen gabapentin (NEURONTIN) 600 MG tablet TAKE 1 TABLET(600 MG) BY MOUTH TWICE DAILY  . ipratropium-albuterol (DUONEB) 0.5-2.5 (3) MG/3ML SOLN Take 3 mLs by nebulization every 4 (four) hours as needed (for shortness of breath/wheezing).  Marland Kitchen loratadine (CLARITIN) 10 MG tablet Take 1 tablet (10 mg total) by mouth daily.  Marland Kitchen LORazepam (ATIVAN) 1 MG tablet Take 1 mg by mouth at bedtime as needed for sleep.   . metoprolol tartrate (LOPRESSOR) 25 MG tablet Take 1 tablet (25 mg total) by mouth 2 (two) times daily.  . montelukast (SINGULAIR) 10  MG tablet Take 1 tablet (10 mg total) by mouth at bedtime.  Marland Kitchen nystatin (MYCOSTATIN) 100000 UNIT/ML suspension Take 5 mLs (500,000 Units total) by mouth 4 (four) times daily.  . ondansetron (ZOFRAN) 4 MG tablet Take 1 tablet (4 mg total) by mouth every 6 (six) hours as needed for nausea.  Marland Kitchen oxybutynin (DITROPAN) 5 MG tablet Take 1 tablet (5 mg total) by mouth in the morning and at bedtime.  Marland Kitchen rOPINIRole (REQUIP) 0.5 MG tablet Take two tablets po BID for restless legs.  . SPIRIVA RESPIMAT 1.25 MCG/ACT AERS  INHALE 2 PUFFS INTO THE LUNGS DAILY  . SUMAtriptan (IMITREX) 100 MG tablet Take 1 tablet (100 mg total) by mouth every 2 (two) hours as needed (for migraine headaches.). May repeat in 1 hours if headache persists or recurs.  . traZODone (DESYREL) 150 MG tablet Take 150-300 mg by mouth at bedtime.   . valACYclovir (VALTREX) 500 MG tablet Take 1 tablet (500 mg total) by mouth daily as needed (for fever blisters).   No facility-administered encounter medications on file as of 08/20/2020.    Surgical History: Past Surgical History:  Procedure Laterality Date  . ABDOMINAL HYSTERECTOMY    . AMPUTATION TOE Left 07/31/2016   Procedure: AMPUTATION TOE/MPJ 2nd toe;  Surgeon: Sharlotte Alamo, DPM;  Location: [facility] ORS;  Service: Podiatry;  Laterality: Left;  . APPENDECTOMY  1990  . BACK SURGERY     low back  . BREAST SURGERY     bilateral breast reduction  . CARDIAC ELECTROPHYSIOLOGY STUDY AND ABLATION    . CHOLECYSTECTOMY  1990  . COLONOSCOPY WITH PROPOFOL N/A 01/04/2017   Procedure: COLONOSCOPY WITH PROPOFOL;  Surgeon: Manya Silvas, MD;  Location: [facility] ENDOSCOPY;  Service: Endoscopy;  Laterality: N/A;  . CORNEAL TRANSPLANT    . ESOPHAGOGASTRODUODENOSCOPY (EGD) WITH PROPOFOL N/A 01/04/2017   Procedure: ESOPHAGOGASTRODUODENOSCOPY (EGD) WITH PROPOFOL;  Surgeon: Manya Silvas, MD;  Location: [facility] ENDOSCOPY;  Service: Endoscopy;  Laterality: N/A;  . EXCISION BONE CYST Left 07/31/2016   Procedure: EXCISION BONE CYST/exostectomy 28124/left 2nd;  Surgeon: Sharlotte Alamo, DPM;  Location: [facility] ORS;  Service: Podiatry;  Laterality: Left;  . EXTRACORPOREAL SHOCK WAVE LITHOTRIPSY Left 09/12/2015   Procedure: EXTRACORPOREAL SHOCK WAVE LITHOTRIPSY (ESWL);  Surgeon: Hollice Espy, MD;  Location: [facility] ORS;  Service: Urology;  Laterality: Left;  . FRACTURE SURGERY     left foot  . HH repair    . JOINT REPLACEMENT Bilateral 2013,2014   total knees  . LAPAROSCOPIC HYSTERECTOMY    . LITHOTRIPSY    .  periprosthetic supracondylar fracture of left femur  02/16/2020   [facility]  . TONSILLECTOMY    . TOTAL KNEE REVISION Right 11/14/2019   Procedure: Revision patella and tibial polyethylene;  Surgeon: Hessie Knows, MD;  Location: [facility] ORS;  Service: Orthopedics;  Laterality: Right;  . URETEROSCOPY      Medical History: Past Medical History:  Diagnosis Date  . Acid reflux   . Anxiety   . Arrhythmia    treated with meds and has no current problems  . Arthritis    most uncomfortable in knees  . Asthma    uses several inhalers  . Depression   . Fever blister   . Hematuria   . History of kidney stones   . Hypertension   . Hypoglycemia   . Left flank pain   . Migraine   . Restless leg   . Yeast vaginitis     Family History: Family History  Problem Relation Age of Onset  . Prostate cancer Father   . Kidney Stones Father   . Kidney disease Neg Hx     Social History   Socioeconomic History  . Marital status: Married    Spouse name: Not on file  . Number of children: Not on file  . Years of education: Not on file  . Highest education level: Not on file  Occupational History  . Not on file  Tobacco Use  . Smoking status: Never Smoker  . Smokeless tobacco: Never Used  Vaping Use  . Vaping Use: Never used  Substance and Sexual Activity  . Alcohol use: No  . Drug use: No  . Sexual activity: Not on file  Other Topics Concern  . Not on file  Social History Narrative  . Not on file   Social Determinants of Health   Financial Resource Strain: Not on file  Food Insecurity: Not on file  Transportation Needs: Not on file  Physical Activity: Not on file  Stress: Not on file  Social Connections: Not on file  Intimate Partner Violence: Not on file      Review of Systems  Constitutional: Positive for appetite change, fatigue and fever. Negative for chills.  HENT: Positive for ear pain, sore throat and trouble swallowing. Negative for congestion, postnasal drip,  rhinorrhea, sinus pressure, sinus pain and sneezing.   Respiratory: Positive for cough, chest tightness, shortness of breath and wheezing.   Cardiovascular: Negative.  Negative for chest pain.  Gastrointestinal: Negative for abdominal pain, diarrhea, nausea and vomiting.  Musculoskeletal: Negative for myalgias.  Neurological: Positive for headaches.    Vital Signs: Temp 99.8 F (37.7 C)   Ht 5\' 3"  (1.6 m)   Wt 163 lb (73.9 kg)   BMI 28.87 kg/m    Observation/Objective: Over telephone, patient sounds out of breath and congested. She is alert and oriented and able to answer questions without difficulty. She has to pause to breathe while speaking. Unable to see patient on video, she could not get it to work.     Assessment/Plan: 1. Acute bronchitis with asthma Antibiotics prescribed to treat possible bacterial lower respiratory tract infection. Possible that her symptoms could be covid related. Patient scheduled for sick visit tomorrow afternoon, will wait in her car to be tested for covid, if negative she will come in for her visit. If positive, she will be prescribed appropriate treatment. - amoxicillin-clavulanate (AUGMENTIN) 875-125 MG tablet; Take 1 tablet by mouth 2 (two) times daily for 10 days.  Dispense: 20 tablet; Refill: 0  2. Cough with fever Symptoms consistent with possible COVID infection. Patient will come to clinic tomorrow to have COVID test done. Patient has asthma and is short of breath which increases her risk of COVID infection.    General Counseling: Summer Murphy verbalizes understanding of the findings of today's phone visit and agrees with plan of treatment. I have discussed any further diagnostic evaluation that may be needed or ordered today. We also reviewed her medications today. she has been encouraged to call the office with any questions or concerns that should arise related to todays visit.    No orders of the defined types were placed in this  encounter.   Meds ordered this encounter  Medications  . amoxicillin-clavulanate (AUGMENTIN) 875-125 MG tablet    Sig: Take 1 tablet by mouth 2 (two) times daily for 10 days.    Dispense:  20 tablet    Refill:  0    Order  Specific Question:   Supervising Provider    Answer:   Lavera Guise [[id number]]   Return in 1 day (on 08/21/2020) for F/U sick visit, covid test, Icesis Renn PCP.   Time spent:15 Minutes    Jonetta Osgood, MSN, FNP-C Internal medicine

## 2020-08-21 ENCOUNTER — Encounter: Payer: Self-pay | Admitting: Nurse Practitioner

## 2020-08-21 ENCOUNTER — Encounter: Payer: Self-pay | Admitting: Emergency Medicine

## 2020-08-21 ENCOUNTER — Emergency Department: Payer: Medicare Other

## 2020-08-21 ENCOUNTER — Ambulatory Visit: Payer: Medicare Other

## 2020-08-21 ENCOUNTER — Ambulatory Visit (INDEPENDENT_AMBULATORY_CARE_PROVIDER_SITE_OTHER): Payer: Medicare Other | Admitting: Nurse Practitioner

## 2020-08-21 ENCOUNTER — Other Ambulatory Visit: Payer: Self-pay

## 2020-08-21 ENCOUNTER — Emergency Department
Admission: EM | Admit: 2020-08-21 | Discharge: 2020-08-21 | Disposition: A | Discharge status: Home / Self Care | Payer: Medicare Other | Attending: Emergency Medicine | Admitting: Emergency Medicine

## 2020-08-21 VITALS — BP 138/72 | HR 93 | Temp 98.3°F | Resp 16 | Ht 63.0 in | Wt 161.0 lb

## 2020-08-21 DIAGNOSIS — R0602 Shortness of breath: Secondary | ICD-10-CM | POA: Diagnosis not present

## 2020-08-21 DIAGNOSIS — Z1152 Encounter for screening for COVID-19: Secondary | ICD-10-CM | POA: Diagnosis not present

## 2020-08-21 DIAGNOSIS — Z96653 Presence of artificial knee joint, bilateral: Secondary | ICD-10-CM | POA: Insufficient documentation

## 2020-08-21 DIAGNOSIS — J441 Chronic obstructive pulmonary disease with (acute) exacerbation: Secondary | ICD-10-CM | POA: Diagnosis not present

## 2020-08-21 DIAGNOSIS — Z79899 Other long term (current) drug therapy: Secondary | ICD-10-CM | POA: Diagnosis not present

## 2020-08-21 DIAGNOSIS — R21 Rash and other nonspecific skin eruption: Secondary | ICD-10-CM | POA: Diagnosis not present

## 2020-08-21 DIAGNOSIS — K449 Diaphragmatic hernia without obstruction or gangrene: Secondary | ICD-10-CM | POA: Diagnosis not present

## 2020-08-21 DIAGNOSIS — J4541 Moderate persistent asthma with (acute) exacerbation: Secondary | ICD-10-CM | POA: Insufficient documentation

## 2020-08-21 DIAGNOSIS — I10 Essential (primary) hypertension: Secondary | ICD-10-CM | POA: Diagnosis not present

## 2020-08-21 DIAGNOSIS — R0789 Other chest pain: Secondary | ICD-10-CM | POA: Diagnosis not present

## 2020-08-21 DIAGNOSIS — R059 Cough, unspecified: Secondary | ICD-10-CM

## 2020-08-21 DIAGNOSIS — E119 Type 2 diabetes mellitus without complications: Secondary | ICD-10-CM | POA: Insufficient documentation

## 2020-08-21 LAB — BASIC METABOLIC PANEL
Anion gap: 13 (ref 5–15)
BUN: 11 mg/dL (ref 8–23)
CO2: 25 mmol/L (ref 22–32)
Calcium: 9.6 mg/dL (ref 8.9–10.3)
Chloride: 101 mmol/L (ref 98–111)
Creatinine, Ser: 0.63 mg/dL (ref 0.44–1.00)
GFR, Estimated: >60 mL/min (ref >60)
Glucose, Bld: 125 mg/dL — ABNORMAL HIGH (ref 70–99)
Potassium: 3.6 mmol/L (ref 3.5–5.1)
Sodium: 139 mmol/L (ref 135–145)

## 2020-08-21 LAB — CBC
HCT: 34.4 % — ABNORMAL LOW (ref 36.0–46.0)
Hemoglobin: 10.9 g/dL — ABNORMAL LOW (ref 12.0–15.0)
MCH: 25.2 pg — ABNORMAL LOW (ref 26.0–34.0)
MCHC: 31.7 g/dL (ref 30.0–36.0)
MCV: 79.6 fL — ABNORMAL LOW (ref 80.0–100.0)
Platelets: 296 10*3/uL (ref 150–400)
RBC: 4.32 MIL/uL (ref 3.87–5.11)
RDW: 17.4 % — ABNORMAL HIGH (ref 11.5–15.5)
WBC: 9.8 10*3/uL (ref 4.0–10.5)
nRBC: 0 % (ref 0.0–0.2)

## 2020-08-21 LAB — TROPONIN I (HIGH SENSITIVITY): Troponin I (High Sensitivity): 5 ng/L (ref <18)

## 2020-08-21 MED ORDER — PREDNISONE 20 MG PO TABS
60.0000 mg | ORAL_TABLET | Freq: Once | ORAL | Status: AC
  Administered 2020-08-21: 60 mg via ORAL
  Filled 2020-08-21: qty 3

## 2020-08-21 MED ORDER — IPRATROPIUM-ALBUTEROL 0.5-2.5 (3) MG/3ML IN SOLN
6.0000 mL | Freq: Once | RESPIRATORY_TRACT | Status: AC
  Administered 2020-08-21: 6 mL via RESPIRATORY_TRACT
  Filled 2020-08-21: qty 6

## 2020-08-21 MED ORDER — PREDNISONE 50 MG PO TABS: 50.0000 mg | ORAL_TABLET | Freq: Every day | ORAL | 0 refills | Status: DC

## 2020-08-21 MED ORDER — IPRATROPIUM-ALBUTEROL 0.5-2.5 (3) MG/3ML IN SOLN
3.0000 mL | Freq: Once | RESPIRATORY_TRACT | Status: AC
  Administered 2020-08-21: 3 mL via RESPIRATORY_TRACT

## 2020-08-21 NOTE — ED Triage Notes (Signed)
Pt comes into the ED from her PCP office where they sent her over for her O2 levels dropping.  Physician is concerned the patient may have pneumonia.  Pt does have a h/o COPD.  Pt currently presents with O2 reading at 99% RA.  Pt present with even and unlabored respirations.  Pt states she has had a productive cough.

## 2020-08-21 NOTE — Discharge Instructions (Signed)
As we discussed, you are being discharged to a prescription for prednisone steroids to take once daily starting tomorrow.  We already gave you a dose of steroids for today.  Please pick up your prescription for Augmentin antibiotics as well.  I do not think you need to take 10 days of this antibiotic considering how well your x-ray looks today without signs of pneumonia.  Consider just taking 5 days of his antibiotic, twice daily  If you develop any further worsening symptoms despite these measures, please return to the ED.

## 2020-08-21 NOTE — ED Provider Notes (Signed)
[facility] Emergency Department Provider Note ____________________________________________   Event Date/Time   First MD Initiated Contact with Patient 08/21/20 1714     (approximate)  I have reviewed the triage vital signs and the nursing notes.  HISTORY  Chief Complaint Shortness of Breath   HPI Summer Murphy is a 65 y.o. femalewho presents to the ED for evaluation of SOB and productive cough  Chart review indicates hx asthma on spiriva, breo ellipta.  Video visit with pulm NP yesterday for productive cough, provided augmentin script.   Patient presents to the ED for evaluation of possible pneumonia and hypoxia [place] walk-in clinic.  She reports having O2 saturations 91-99%, so she was sent to the ED for evaluation.   Patient reports 4-5 days of upper respiratory congestion, rhinorrhea and postnasal drip, with increased wheezing, shortness of breath and productive cough over the past 3 days.  Denies chest pains, syncopal episodes, abdominal pain, emesis or diarrhea.  Reports that she has not yet picked up her prescription for Augmentin that was prescribed yesterday and reports that she has not been provided systemic steroids recently.   Past Medical History:  Diagnosis Date  . Acid reflux   . Anxiety   . Arrhythmia    treated with meds and has no current problems  . Arthritis    most uncomfortable in knees  . Asthma    uses several inhalers  . Depression   . Fever blister   . Hematuria   . History of kidney stones   . Hypertension   . Hypoglycemia   . Left flank pain   . Migraine   . Restless leg   . Yeast vaginitis     Patient Active Problem List   Diagnosis Date Noted  . 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 Seasonal allergic rhinitis due to pollen 09/20/2019  . Callus of foot 08/13/2019  . Candidal vaginitis 08/13/2019  . Other symptoms and signs involving the nervous system 08/13/2019  . Encounter  for general adult medical examination with abnormal findings 08/13/2019  . Restless leg syndrome 04/30/2019  . Type 2 diabetes mellitus with hyperglycemia ([place]) 04/02/2019  . Muscle cramps 04/02/2019  . Diastolic dysfunction 09/98/3382  . SOB (shortness of breath) 12/14/2018  . Hiatal hernia with GERD 12/14/2018  . Wheezing 12/14/2018  . Impaired fasting glucose 12/14/2018  . COPD with acute exacerbation ([place]) 09/28/2018  . Acute upper respiratory infection 08/25/2018  . Recurrent sinusitis 08/15/2018  . Prediabetes 08/15/2018  . Nonintractable headache 08/15/2018  . Dysuria 08/15/2018  . Intractable migraine without status migrainosus 04/22/2018  . Nausea 04/22/2018  . Polyneuropathy associated with underlying disease ([place]) 04/22/2018  . Midline low back pain without sciatica 04/22/2018  . Routine cervical smear 04/22/2018  . Essential hypertension 07/01/2017  . Acute bronchitis with asthma 07/01/2017  . Allergic rhinitis 07/01/2017  . Moderate asthma without complication 50/53/9767  . Severe recurrent major depression without psychotic features ([place]) 09/10/2014  . COPD (chronic obstructive pulmonary disease) ([place]) 09/10/2014  . Calculi, ureter 04/26/2013  . Corneal graft malfunction 08/17/2012  . Calculus of kidney 04/26/2012  . Renal colic 34/19/3790  . Urge incontinence 04/26/2012  . Diaphragmatic hernia 10/27/2010  . Barrett esophagus 07/07/2010    Past Surgical History:  Procedure Laterality Date  . ABDOMINAL HYSTERECTOMY    . AMPUTATION TOE Left 07/31/2016   Procedure: AMPUTATION TOE/MPJ 2nd toe;  Surgeon: Sharlotte Alamo, DPM;  Location: [facility] ORS;  Service:  Podiatry;  Laterality: Left;  . APPENDECTOMY  1990  . BACK SURGERY     low back  . BREAST SURGERY     bilateral breast reduction  . CARDIAC ELECTROPHYSIOLOGY STUDY AND ABLATION    . CHOLECYSTECTOMY  1990  . COLONOSCOPY WITH PROPOFOL N/A 01/04/2017   Procedure: COLONOSCOPY WITH PROPOFOL;  Surgeon: Manya Silvas, MD;  Location: [facility] ENDOSCOPY;  Service: Endoscopy;  Laterality: N/A;  . CORNEAL TRANSPLANT    . ESOPHAGOGASTRODUODENOSCOPY (EGD) WITH PROPOFOL N/A 01/04/2017   Procedure: ESOPHAGOGASTRODUODENOSCOPY (EGD) WITH PROPOFOL;  Surgeon: Manya Silvas, MD;  Location: [facility] ENDOSCOPY;  Service: Endoscopy;  Laterality: N/A;  . EXCISION BONE CYST Left 07/31/2016   Procedure: EXCISION BONE CYST/exostectomy 28124/left 2nd;  Surgeon: Sharlotte Alamo, DPM;  Location: [facility] ORS;  Service: Podiatry;  Laterality: Left;  . EXTRACORPOREAL SHOCK WAVE LITHOTRIPSY Left 09/12/2015   Procedure: EXTRACORPOREAL SHOCK WAVE LITHOTRIPSY (ESWL);  Surgeon: Hollice Espy, MD;  Location: [facility] ORS;  Service: Urology;  Laterality: Left;  . FRACTURE SURGERY     left foot  . HH repair    . JOINT REPLACEMENT Bilateral 2013,2014   total knees  . LAPAROSCOPIC HYSTERECTOMY    . LITHOTRIPSY    . periprosthetic supracondylar fracture of left femur  02/16/2020   [facility]  . TONSILLECTOMY    . TOTAL KNEE REVISION Right 11/14/2019   Procedure: Revision patella and tibial polyethylene;  Surgeon: Hessie Knows, MD;  Location: [facility] ORS;  Service: Orthopedics;  Laterality: Right;  . URETEROSCOPY      Prior to Admission medications   Medication Sig Start Date End Date Taking? Authorizing Provider  predniSONE (DELTASONE) 50 MG tablet Take 1 tablet (50 mg total) by mouth daily. 08/21/20  Yes Vladimir Crofts, MD  amoxicillin-clavulanate (AUGMENTIN) 875-125 MG tablet Take 1 tablet by mouth 2 (two) times daily for 10 days. 08/20/20 08/30/20  Jonetta Osgood, NP  Biotin 800 MCG TABS Take 1 tablet (800 mcg total) by mouth daily. 04/03/20   Luiz Ochoa, NP  Brexpiprazole 4 MG TABS Take 4 mg by mouth daily.     [provider]  busPIRone (BUSPAR) 10 MG tablet Take 10 mg by mouth 2 (two) times daily.  07/13/18   [provider]  clotrimazole-betamethasone (LOTRISONE) cream Apply 1 application topically 2 (two) times daily.  06/06/20   Luiz Ochoa, NP  COMBIVENT RESPIMAT 20-100 MCG/ACT AERS respimat INHALE 1 PUFF INTO THE LUNGS EVERY 6 HOURS 07/19/20   Lavera Guise, MD  diclofenac sodium (VOLTAREN) 1 % GEL Apply 2 g topically in the morning, at noon, and at bedtime.    [provider]  diclofenac Sodium (VOLTAREN) 1 % GEL Apply 4 g topically 4 (four) times daily. 08/13/20   Lavera Guise, MD  diltiazem (DILACOR XR) 180 MG 24 hr capsule Take 1 capsule (180 mg total) by mouth daily. 04/22/20   Lavera Guise, MD  diphenhydrAMINE (BENADRYL) 25 MG tablet Take 50 mg by mouth 2 (two) times daily as needed.     [provider]  diphenoxylate-atropine (LOMOTIL) 2.5-0.025 MG tablet Take 1 tablet by mouth 3 (three) times daily as needed for diarrhea or loose stools. 05/01/19   Ronnell Freshwater, NP  docusate sodium (COLACE) 100 MG capsule Take 1 capsule (100 mg total) by mouth 2 (two) times daily. 11/15/19   Duanne Guess, PA-C  DULoxetine (CYMBALTA) 60 MG capsule Take 60 mg by mouth daily.    [provider]  fluticasone furoate-vilanterol (BREO ELLIPTA) 200-25 MCG/INH AEPB Inhale 1 puff into the lungs daily. 08/13/20   Lavera Guise, MD  gabapentin (NEURONTIN) 600 MG tablet TAKE 1 TABLET(600 MG) BY MOUTH TWICE DAILY 05/20/20   Lavera Guise, MD  ipratropium-albuterol (DUONEB) 0.5-2.5 (3) MG/3ML SOLN Take 3 mLs by nebulization every 4 (four) hours as needed (for shortness of breath/wheezing). 01/25/19   Kendell Bane, NP  loratadine (CLARITIN) 10 MG tablet Take 1 tablet (10 mg total) by mouth daily. 06/06/20   Luiz Ochoa, NP  LORazepam (ATIVAN) 1 MG tablet Take 1 mg by mouth at bedtime as needed for sleep.     [provider]  metoprolol tartrate (LOPRESSOR) 25 MG tablet Take 1 tablet (25 mg total) by mouth 2 (two) times daily. 11/17/19   Duanne Guess, PA-C  montelukast (SINGULAIR) 10 MG tablet Take 1 tablet (10 mg total) by mouth at bedtime. 06/06/20   Luiz Ochoa, NP  nystatin  (MYCOSTATIN) 100000 UNIT/ML suspension Take 5 mLs (500,000 Units total) by mouth 4 (four) times daily. 06/06/20   Luiz Ochoa, NP  ondansetron (ZOFRAN) 4 MG tablet Take 1 tablet (4 mg total) by mouth every 6 (six) hours as needed for nausea. 11/15/19   Duanne Guess, PA-C  oxybutynin (DITROPAN) 5 MG tablet Take 1 tablet (5 mg total) by mouth in the morning and at bedtime. 08/13/20   Lavera Guise, MD  rOPINIRole (REQUIP) 0.5 MG tablet Take two tablets po BID for restless legs. 08/13/20   Lavera Guise, MD  SPIRIVA RESPIMAT 1.25 MCG/ACT AERS INHALE 2 PUFFS INTO THE LUNGS DAILY 05/20/20   Lavera Guise, MD  SUMAtriptan (IMITREX) 100 MG tablet Take 1 tablet (100 mg total) by mouth every 2 (two) hours as needed (for migraine headaches.). May repeat in 1 hours if headache persists or recurs. 08/13/20   Lavera Guise, MD  traZODone (DESYREL) 150 MG tablet Take 150-300 mg by mouth at bedtime.     [provider]  valACYclovir (VALTREX) 500 MG tablet Take 1 tablet (500 mg total) by mouth daily as needed (for fever blisters). 08/13/20   Lavera Guise, MD    Allergies Librium [chlordiazepoxide], Metoclopramide, Vanilla, Aspirin, Nsaids, Azithromycin, Buprenorphine hcl, Chlordiazepoxide hcl, Morphine, Sulfa antibiotics, Tolmetin, Amlodipine besylate, and Iron  Family History  Problem Relation Age of Onset  . Prostate cancer Father   . Kidney Stones Father   . Kidney disease Neg Hx     Social History Social History   Tobacco Use  . Smoking status: Never Smoker  . Smokeless tobacco: Never Used  Vaping Use  . Vaping Use: Never used  Substance Use Topics  . Alcohol use: No  . Drug use: No    Review of Systems  Constitutional: Positive for subjective fever/chills Eyes: No visual changes. ENT: Positive for congestion and rhinorrhea Cardiovascular: Denies chest pain. Respiratory: Positive shortness of breath, increased cough and productive cough Gastrointestinal: No abdominal pain.   No nausea, no vomiting.  No diarrhea.  No constipation. Genitourinary: Negative for dysuria. Musculoskeletal: Negative for back pain. Skin: Negative for rash. Neurological: Negative for headaches, focal weakness or numbness.  ____________________________________________   PHYSICAL EXAM:  VITAL SIGNS: Vitals:   08/21/20 1605  BP: 138/83  Pulse: (!) 106  Resp: 18  Temp: 99.6 F (37.6 C)  SpO2: 99%     Constitutional: Alert and oriented.  Appears uncomfortable but in no acute distress, conversational in full sentences Eyes:  Conjunctivae are normal. PERRL. EOMI. Head: Atraumatic. Nose: No congestion/rhinnorhea. Mouth/Throat: Mucous membranes are moist.  Oropharynx non-erythematous. Neck: No stridor. No cervical spine tenderness to palpation. Cardiovascular: Tachycardic rate, regular rhythm. Grossly normal heart sounds.  Good peripheral circulation. Respiratory: Minimal tachypnea to the low 20s, no further evidence of distress no retractions.  Diffuse and scattered expiratory wheezes with slight decrease in air movement throughout. Gastrointestinal: Soft , nondistended, nontender to palpation. No CVA tenderness. Musculoskeletal: No lower extremity tenderness nor edema.  No joint effusions. No signs of acute trauma. Neurologic:  Normal speech and language. No gross focal neurologic deficits are appreciated. No gait instability noted. Skin:  Skin is warm, dry and intact. No rash noted. Psychiatric: Mood and affect are normal. Speech and behavior are normal.  ____________________________________________   LABS (all labs ordered are listed, but only abnormal results are displayed)  Labs Reviewed  BASIC METABOLIC PANEL - Abnormal; Notable for the following components:      Result Value   Glucose, Bld 125 (*)    All other components within normal limits  CBC - Abnormal; Notable for the following components:   Hemoglobin 10.9 (*)    HCT 34.4 (*)    MCV 79.6 (*)    MCH 25.2 (*)     RDW 17.4 (*)    All other components within normal limits  SARS CORONAVIRUS 2 (TAT 6-24 HRS)  TROPONIN I (HIGH SENSITIVITY)  TROPONIN I (HIGH SENSITIVITY)   ____________________________________________  12 Lead EKG  Sinus tachycardia, rate of 106 bpm.  Normal axis.  Left bundle branch block without ischemic features ____________________________________________  RADIOLOGY  ED MD interpretation: 2 view CXR reviewed by me without evidence of acute cardiopulmonary pathology  Official radiology report(s): DG Chest 2 View  Result Date: 08/21/2020 CLINICAL DATA:  65 year old female with shortness of breath and chest tightness. EXAM: CHEST - 2 VIEW COMPARISON:  Chest radiograph dated 09/23/2019. FINDINGS: No focal consolidation, pleural effusion, or pneumothorax. The cardiac silhouette is within limits. Small hiatal hernia. No acute osseous pathology. Multiple surgical clips in the epigastric area. IMPRESSION: No active cardiopulmonary disease. Electronically Signed   By: Anner Crete M.D.   On: 08/21/2020 16:28    ____________________________________________   PROCEDURES and INTERVENTIONS  Procedure(s) performed (including Critical Care):  .1-3 Lead EKG Interpretation Performed by: Vladimir Crofts, MD Authorized by: Vladimir Crofts, MD     Interpretation: abnormal     ECG rate:  102   ECG rate assessment: tachycardic     Rhythm: sinus tachycardia     Ectopy: none     Conduction: normal      Medications  predniSONE (DELTASONE) tablet 60 mg (60 mg Oral Given 08/21/20 1736)  ipratropium-albuterol (DUONEB) 0.5-2.5 (3) MG/3ML nebulizer solution 6 mL (6 mLs Nebulization Given 08/21/20 1736)    ____________________________________________   MDM / ED COURSE   65 year old woman with history of moderate persistent asthma presents to the ED with evidence of acute exacerbation.  Presents tachycardic but not hypoxic here in the ED.  Exam with stigmata of asthma exacerbation diffuse  expiratory wheezes and decreased air movement throughout.  Otherwise benign exam without distress.  CXR without infiltrate or PTX.  Blood work is unremarkable.  We will swab for COVID-19, as I suspect a viral syndrome precipitating her exacerbation.  Will provide breathing treatments and initiate course of steroids.  Plan to reassess for possible outpatient management.   Clinical Course as of 08/21/20 1837  Wed Aug 21, 2020  1836 Reassessed.  Patient reports improved symptoms after breathing treatments.  No longer tachycardic and improved airflow on auscultation without wheezing.  We discussed steroids as an outpatient and return precautions for the ED. [DS]    Clinical Course User Index [DS] Vladimir Crofts, MD    ____________________________________________   FINAL CLINICAL IMPRESSION(S) / ED DIAGNOSES  Final diagnoses:  Moderate persistent asthma with exacerbation  Shortness of breath  Cough     ED Discharge Orders         Ordered    predniSONE (DELTASONE) 50 MG tablet  Daily        08/21/20 1724           Zadia Uhde   Note:  This document was prepared using Systems analyst and may include unintentional dictation errors.   Vladimir Crofts, MD 08/21/20 [id number]

## 2020-08-21 NOTE — Progress Notes (Signed)
[facility] [place], [place] [address]  Internal MEDICINE  Office Visit Note  Patient Name: Summer Murphy  [id number]  [id number]  Date of Service: 08/21/2020  Chief Complaint  Patient presents with  . Acute Visit    Coughing , green mucus, pt tongue has irritation. Headaches, symptoms started on Saturday    . Quality Metric Gaps    Mammogram      HPI Summer Murphy presents for an acute sick visit. Summer Murphy had a virtual visit yesterday 08/20/20 and Summer Murphy was experiencing symptoms of a possible upper respiratory infection and complaints of shortness of breath, cough and wheezing. Summer Murphy was tested for COVID prior to entering the building for her office visit. Summer Murphy tested negative for COVID. Her oxygen saturation in the office was 92% when Summer Murphy was checked in.  Summer Murphy is sitting on the exam table with noticeable dyspnea, increased work of breathing and use of accessory muscles. Her physical movement are rapid, repetitive and agitated. Summer Murphy has difficulty speaking a full sentence without becoming short of breath. Summer Murphy has to stop to breathe after every 5-6 words. Summer Murphy has a history of asthma.  Summer Murphy has not yet picked up the antibiotic that was prescribed yesterday.   Current Medication:  Outpatient Encounter Medications as of 08/21/2020  Medication Sig  . amoxicillin-clavulanate (AUGMENTIN) 875-125 MG tablet Take 1 tablet by mouth 2 (two) times daily for 10 days.  . Biotin 800 MCG TABS Take 1 tablet (800 mcg total) by mouth daily.  . Brexpiprazole 4 MG TABS Take 4 mg by mouth daily.   . busPIRone (BUSPAR) 10 MG tablet Take 10 mg by mouth 2 (two) times daily.   . clotrimazole-betamethasone (LOTRISONE) cream Apply 1 application topically 2 (two) times daily.  . COMBIVENT RESPIMAT 20-100 MCG/ACT AERS respimat INHALE 1 PUFF INTO THE LUNGS EVERY 6 HOURS  . diclofenac sodium (VOLTAREN) 1 % GEL Apply 2 g topically in the morning, at noon, and at bedtime.  . diclofenac Sodium (VOLTAREN) 1  % GEL Apply 4 g topically 4 (four) times daily.  Marland Kitchen diltiazem (DILACOR XR) 180 MG 24 hr capsule Take 1 capsule (180 mg total) by mouth daily.  . diphenhydrAMINE (BENADRYL) 25 MG tablet Take 50 mg by mouth 2 (two) times daily as needed.   . diphenoxylate-atropine (LOMOTIL) 2.5-0.025 MG tablet Take 1 tablet by mouth 3 (three) times daily as needed for diarrhea or loose stools.  . docusate sodium (COLACE) 100 MG capsule Take 1 capsule (100 mg total) by mouth 2 (two) times daily.  . DULoxetine (CYMBALTA) 60 MG capsule Take 60 mg by mouth daily.  . fluticasone furoate-vilanterol (BREO ELLIPTA) 200-25 MCG/INH AEPB Inhale 1 puff into the lungs daily.  Marland Kitchen gabapentin (NEURONTIN) 600 MG tablet TAKE 1 TABLET(600 MG) BY MOUTH TWICE DAILY  . ipratropium-albuterol (DUONEB) 0.5-2.5 (3) MG/3ML SOLN Take 3 mLs by nebulization every 4 (four) hours as needed (for shortness of breath/wheezing).  Marland Kitchen loratadine (CLARITIN) 10 MG tablet Take 1 tablet (10 mg total) by mouth daily.  Marland Kitchen LORazepam (ATIVAN) 1 MG tablet Take 1 mg by mouth at bedtime as needed for sleep.   . metoprolol tartrate (LOPRESSOR) 25 MG tablet Take 1 tablet (25 mg total) by mouth 2 (two) times daily.  . montelukast (SINGULAIR) 10 MG tablet Take 1 tablet (10 mg total) by mouth at bedtime.  Marland Kitchen nystatin (MYCOSTATIN) 100000 UNIT/ML suspension Take 5 mLs (500,000 Units total) by mouth 4 (four) times daily.  . ondansetron (ZOFRAN) 4  MG tablet Take 1 tablet (4 mg total) by mouth every 6 (six) hours as needed for nausea.  Marland Kitchen oxybutynin (DITROPAN) 5 MG tablet Take 1 tablet (5 mg total) by mouth in the morning and at bedtime.  Marland Kitchen rOPINIRole (REQUIP) 0.5 MG tablet Take two tablets po BID for restless legs.  . SPIRIVA RESPIMAT 1.25 MCG/ACT AERS INHALE 2 PUFFS INTO THE LUNGS DAILY  . SUMAtriptan (IMITREX) 100 MG tablet Take 1 tablet (100 mg total) by mouth every 2 (two) hours as needed (for migraine headaches.). May repeat in 1 hours if headache persists or recurs.  .  traZODone (DESYREL) 150 MG tablet Take 150-300 mg by mouth at bedtime.   . valACYclovir (VALTREX) 500 MG tablet Take 1 tablet (500 mg total) by mouth daily as needed (for fever blisters).   No facility-administered encounter medications on file as of 08/21/2020.      Medical History: Past Medical History:  Diagnosis Date  . Acid reflux   . Anxiety   . Arrhythmia    treated with meds and has no current problems  . Arthritis    most uncomfortable in knees  . Asthma    uses several inhalers  . Depression   . Fever blister   . Hematuria   . History of kidney stones   . Hypertension   . Hypoglycemia   . Left flank pain   . Migraine   . Restless leg   . Yeast vaginitis      Vital Signs: BP 138/72   Pulse 93   Temp 98.3 F (36.8 C)   Resp 16   Ht 5\' 3"  (1.6 m)   Wt 161 lb (73 kg)   SpO2 92%   BMI 28.52 kg/m    Review of Systems  Constitutional: Positive for fatigue.  HENT: Positive for sore throat.   Respiratory: Positive for cough, chest tightness, shortness of breath and wheezing.   Cardiovascular: Negative for chest pain.  Musculoskeletal: Positive for myalgias.  Neurological: Positive for dizziness, light-headedness and headaches.    Physical Exam Constitutional:      General: Summer Murphy is not in acute distress.    Appearance: Summer Murphy is well-developed and overweight. Summer Murphy is ill-appearing.  HENT:     Head: Normocephalic and atraumatic.     Right Ear: Tympanic membrane, ear canal and external ear normal.     Left Ear: Tympanic membrane, ear canal and external ear normal.     Nose: Nose normal.     Mouth/Throat:     Mouth: Mucous membranes are pale and dry.     Pharynx: Oropharynx is clear.  Neck:     Trachea: Trachea normal.  Cardiovascular:     Rate and Rhythm: Normal rate and regular rhythm.     Pulses: Normal pulses.     Heart sounds: Normal heart sounds, S1 normal and S2 normal.  Pulmonary:     Effort: Tachypnea, accessory muscle usage and prolonged  expiration present.     Breath sounds: Decreased air movement present. Examination of the right-upper field reveals wheezing. Examination of the left-upper field reveals wheezing. Examination of the right-middle field reveals wheezing. Examination of the left-middle field reveals wheezing. Examination of the right-lower field reveals wheezing. Examination of the left-lower field reveals wheezing. Wheezing (diffuse expiratory wheezes) present.  Abdominal:     General: Bowel sounds are normal.     Palpations: Abdomen is soft.  Musculoskeletal:     Cervical back: Normal range of motion.  Lymphadenopathy:  Cervical: Cervical adenopathy present.  Skin:    General: Skin is warm and dry.     Capillary Refill: Capillary refill takes less than 2 seconds.     Coloration: Skin is pale.     Findings: No rash.  Neurological:     Mental Status: Summer Murphy is alert and oriented to person, place, and time.  Psychiatric:        Mood and Affect: Mood is anxious.        Behavior: Behavior is agitated (psychomotor agitation). Behavior is cooperative.    Assessment/Plan: 1. Moderate persistent asthma with acute exacerbation Upon exam, Laterra's oxygen saturation was checked again and it had dropped to 88% on room air. Ipratropium-albuterol nebulizer treatment administered in the clinic. Demetrius stated that Summer Murphy felt like her breathing had improved some. Summer Murphy was initially 99% on room air immediately after the breathing treatment. In less than 10 minutes, her oxygen saturation dropped back down to 88% on room air. Due to clinical presentation and continued hypoxia after nebulizer treatment, patient was instructed to go to [facility] ER. Fatima Blank CMA called the [facility] ER to notify their staff that Summer Murphy was on the way there. The risks and benefits of going to the ER were explained to [facility]. Summer Murphy agreed to go but wanted to drive herself there. Johnice is alert and oriented and drove her self to the  clinic Summer Murphy states that Summer Murphy is safe to drive and will drive straight to [facility] which is less than 5 minutes away across the street. EMS transport was discussed and offered to the patient but Summer Murphy declined due to financial issues.  - ipratropium-albuterol (DUONEB) 0.5-2.5 (3) MG/3ML nebulizer solution 3 mL  2. Encounter for screening for COVID-19 Tested for COVID-29 prior to entering the building. Test was negative.  - POC SOFIA Antigen FIA   General Counseling: Jalisia verbalizes understanding of the findings of todays visit and agrees with plan of treatment. I have discussed any further diagnostic evaluation that may be needed or ordered today. We also reviewed her medications today. Summer Murphy has been encouraged to call the office with any questions or concerns that should arise related to todays visit.    Counseling:    Orders Placed This Encounter  Procedures  . Infect agent antigen immuno technique acute respiratory syndrome covid    No orders of the defined types were placed in this encounter.  Return for instructed to go to nearest ER, [facility]. Patient will have a hospital F/U visit after Summer Murphy is discharged from [facility] ER.    [place] Controlled Substance Database was reviewed by me.  Time spent:30 Minutes  This patient was seen by Jonetta Osgood, FNP-C in collaboration with Dr. Clayborn Bigness as a part of collaborative care agreement.   Jonetta Osgood, MSN, FNP-C Internal Medicine

## 2020-08-22 ENCOUNTER — Ambulatory Visit: Payer: Medicare Other | Admitting: Nurse Practitioner

## 2020-08-25 NOTE — Procedures (Signed)
[facility] [address] [place], [address]    Complete Pulmonary Function Testing Interpretation:  FINDINGS:  Forced vital capacity is moderately decreased.  FEV1 is 1.21 L which is 53% predicted and is moderately decreased.  Postbronchodilator no significant change in FEV1 clinical improvement may still occur in the absence of spirometric improvement.  F1 FVC ratio is mildly decreased.  Total lung capacity is mildly decreased.  Residual volume is normal residual on current respiration is increased FRC is decreased DLCO was increased  IMPRESSION:  The pulmonary function study is suggestive of moderate obstructive lung disease with a mild restrictive lung disease  Allyne Gee, MD [facility] Pulmonary Critical Care Medicine Sleep Medicine

## 2020-08-29 ENCOUNTER — Other Ambulatory Visit: Payer: Self-pay | Admitting: Adult Health

## 2020-08-29 DIAGNOSIS — J301 Allergic rhinitis due to pollen: Secondary | ICD-10-CM

## 2020-09-03 LAB — POC SOFIA SARS ANTIGEN FIA: SARS Coronavirus 2 Ag: NEGATIVE

## 2020-09-12 DIAGNOSIS — M6281 Muscle weakness (generalized): Secondary | ICD-10-CM | POA: Diagnosis not present

## 2020-09-12 DIAGNOSIS — J449 Chronic obstructive pulmonary disease, unspecified: Secondary | ICD-10-CM | POA: Diagnosis not present

## 2020-09-12 DIAGNOSIS — M978XXD Periprosthetic fracture around other internal prosthetic joint, subsequent encounter: Secondary | ICD-10-CM | POA: Diagnosis not present

## 2020-09-16 ENCOUNTER — Ambulatory Visit (INDEPENDENT_AMBULATORY_CARE_PROVIDER_SITE_OTHER): Payer: Medicaid Other | Admitting: Internal Medicine

## 2020-09-16 ENCOUNTER — Other Ambulatory Visit: Payer: Self-pay

## 2020-09-16 ENCOUNTER — Encounter: Payer: Self-pay | Admitting: Internal Medicine

## 2020-09-16 VITALS — BP 144/76 | HR 105 | Temp 97.3°F | Resp 16 | Ht 63.0 in | Wt 168.8 lb

## 2020-09-16 DIAGNOSIS — R7303 Prediabetes: Secondary | ICD-10-CM

## 2020-09-16 DIAGNOSIS — F332 Major depressive disorder, recurrent severe without psychotic features: Secondary | ICD-10-CM

## 2020-09-16 DIAGNOSIS — K22710 Barrett's esophagus with low grade dysplasia: Secondary | ICD-10-CM | POA: Diagnosis not present

## 2020-09-16 DIAGNOSIS — R300 Dysuria: Secondary | ICD-10-CM

## 2020-09-16 DIAGNOSIS — Z6830 Body mass index (BMI) 30.0-30.9, adult: Secondary | ICD-10-CM

## 2020-09-16 DIAGNOSIS — E1165 Type 2 diabetes mellitus with hyperglycemia: Secondary | ICD-10-CM | POA: Diagnosis not present

## 2020-09-16 DIAGNOSIS — Z0001 Encounter for general adult medical examination with abnormal findings: Secondary | ICD-10-CM | POA: Diagnosis not present

## 2020-09-16 DIAGNOSIS — E782 Mixed hyperlipidemia: Secondary | ICD-10-CM

## 2020-09-16 DIAGNOSIS — D638 Anemia in other chronic diseases classified elsewhere: Secondary | ICD-10-CM | POA: Diagnosis not present

## 2020-09-16 DIAGNOSIS — J301 Allergic rhinitis due to pollen: Secondary | ICD-10-CM | POA: Diagnosis not present

## 2020-09-16 DIAGNOSIS — Z1231 Encounter for screening mammogram for malignant neoplasm of breast: Secondary | ICD-10-CM | POA: Diagnosis not present

## 2020-09-16 DIAGNOSIS — I10 Essential (primary) hypertension: Secondary | ICD-10-CM

## 2020-09-16 DIAGNOSIS — D508 Other iron deficiency anemias: Secondary | ICD-10-CM | POA: Diagnosis not present

## 2020-09-16 DIAGNOSIS — J4541 Moderate persistent asthma with (acute) exacerbation: Secondary | ICD-10-CM

## 2020-09-16 DIAGNOSIS — D510 Vitamin B12 deficiency anemia due to intrinsic factor deficiency: Secondary | ICD-10-CM | POA: Diagnosis not present

## 2020-09-16 DIAGNOSIS — E2839 Other primary ovarian failure: Secondary | ICD-10-CM

## 2020-09-16 MED ORDER — MONTELUKAST SODIUM 10 MG PO TABS: 10.0000 mg | ORAL_TABLET | Freq: Every day | ORAL | 1 refills | Status: DC

## 2020-09-16 MED ORDER — FLUTICASONE PROPIONATE 50 MCG/ACT NA SUSP: 2.0000 | Freq: Every day | NASAL | 6 refills | Status: DC

## 2020-09-16 NOTE — Progress Notes (Signed)
[facility] [place], [place] [address]  Internal MEDICINE  Office Visit Note  Patient Name: Summer Murphy  [id number]  [id number]  Date of Service: 09/23/2020  Chief Complaint  Patient presents with   Medicare Wellness    Left ear feels closed up and pops sometimes, started about a week ago   Depression   Gastroesophageal Reflux   Hypertension   Anxiety   Asthma   Quality Metric Gaps    Covid booster, shingrix, mammogram, dexa     HPI Pt is here for routine health maintenance examination Patient is here with complaints of left ear congestion and popping up, has postnasal drip history of asthma as well. has severe allergies year-long. Labs reviewed today had low hemoglobin with low iron studies we will update today Patient is due to get DEXA, mammogram, and lab work. Patient also has moderately persistent asthma recent exacerbation was earlier this month she is followed by pulmonary. A note is made in her chart about her elevated blood pressure for the last few times. Patient also has abnormal glucose level and is in the prediabetic range we will monitor probably will need hemoglobin A1c on next visit Patient has history of major depression and is stable for now Current Medication: Outpatient Encounter Medications as of 09/16/2020  Medication Sig   Biotin 800 MCG TABS Take 1 tablet (800 mcg total) by mouth daily.   Brexpiprazole 4 MG TABS Take 4 mg by mouth daily.    busPIRone (BUSPAR) 10 MG tablet Take 10 mg by mouth 2 (two) times daily.    clotrimazole-betamethasone (LOTRISONE) cream Apply 1 application topically 2 (two) times daily.   COMBIVENT RESPIMAT 20-100 MCG/ACT AERS respimat INHALE 1 PUFF INTO THE LUNGS EVERY 6 HOURS   diclofenac sodium (VOLTAREN) 1 % GEL Apply 2 g topically in the morning, at noon, and at bedtime.   diclofenac Sodium (VOLTAREN) 1 % GEL Apply 4 g topically 4 (four) times daily.   diltiazem (DILACOR XR) 180 MG 24 hr  capsule Take 1 capsule (180 mg total) by mouth daily.   diphenhydrAMINE (BENADRYL) 25 MG tablet Take 50 mg by mouth 2 (two) times daily as needed.    diphenoxylate-atropine (LOMOTIL) 2.5-0.025 MG tablet Take 1 tablet by mouth 3 (three) times daily as needed for diarrhea or loose stools.   docusate sodium (COLACE) 100 MG capsule Take 1 capsule (100 mg total) by mouth 2 (two) times daily.   DULoxetine (CYMBALTA) 60 MG capsule Take 60 mg by mouth daily.   fluticasone (FLONASE) 50 MCG/ACT nasal spray Place 2 sprays into both nostrils daily.   fluticasone furoate-vilanterol (BREO ELLIPTA) 200-25 MCG/INH AEPB Inhale 1 puff into the lungs daily.   gabapentin (NEURONTIN) 600 MG tablet TAKE 1 TABLET(600 MG) BY MOUTH TWICE DAILY   ipratropium-albuterol (DUONEB) 0.5-2.5 (3) MG/3ML SOLN Take 3 mLs by nebulization every 4 (four) hours as needed (for shortness of breath/wheezing).   loratadine (CLARITIN) 10 MG tablet Take 1 tablet (10 mg total) by mouth daily.   LORazepam (ATIVAN) 1 MG tablet Take 1 mg by mouth at bedtime as needed for sleep.    metoprolol tartrate (LOPRESSOR) 25 MG tablet Take 1 tablet (25 mg total) by mouth 2 (two) times daily.   nystatin (MYCOSTATIN) 100000 UNIT/ML suspension Take 5 mLs (500,000 Units total) by mouth 4 (four) times daily.   ondansetron (ZOFRAN) 4 MG tablet Take 1 tablet (4 mg total) by mouth every 6 (six) hours as needed for nausea.  oxybutynin (DITROPAN) 5 MG tablet Take 1 tablet (5 mg total) by mouth in the morning and at bedtime.   predniSONE (DELTASONE) 50 MG tablet Take 1 tablet (50 mg total) by mouth daily.   rOPINIRole (REQUIP) 0.5 MG tablet Take two tablets po BID for restless legs.   SUMAtriptan (IMITREX) 100 MG tablet Take 1 tablet (100 mg total) by mouth every 2 (two) hours as needed (for migraine headaches.). May repeat in 1 hours if headache persists or recurs.   traZODone (DESYREL) 150 MG tablet Take 150-300 mg by mouth at bedtime.    valACYclovir (VALTREX)  500 MG tablet Take 1 tablet (500 mg total) by mouth daily as needed (for fever blisters).   [DISCONTINUED] montelukast (SINGULAIR) 10 MG tablet Take 1 tablet (10 mg total) by mouth at bedtime.   [DISCONTINUED] SPIRIVA RESPIMAT 1.25 MCG/ACT AERS INHALE 2 PUFFS INTO THE LUNGS DAILY   montelukast (SINGULAIR) 10 MG tablet Take 1 tablet (10 mg total) by mouth at bedtime.   No facility-administered encounter medications on file as of 09/16/2020.    Surgical History: Past Surgical History:  Procedure Laterality Date   ABDOMINAL HYSTERECTOMY     AMPUTATION TOE Left 07/31/2016   Procedure: AMPUTATION TOE/MPJ 2nd toe;  Surgeon: Sharlotte Alamo, DPM;  Location: [facility] ORS;  Service: Podiatry;  Laterality: Left;   APPENDECTOMY  1990   BACK SURGERY     low back   BREAST SURGERY     bilateral breast reduction   CARDIAC ELECTROPHYSIOLOGY STUDY AND ABLATION     CHOLECYSTECTOMY  1990   COLONOSCOPY WITH PROPOFOL N/A 01/04/2017   Procedure: COLONOSCOPY WITH PROPOFOL;  Surgeon: Manya Silvas, MD;  Location: [facility] ENDOSCOPY;  Service: Endoscopy;  Laterality: N/A;   CORNEAL TRANSPLANT     ESOPHAGOGASTRODUODENOSCOPY (EGD) WITH PROPOFOL N/A 01/04/2017   Procedure: ESOPHAGOGASTRODUODENOSCOPY (EGD) WITH PROPOFOL;  Surgeon: Manya Silvas, MD;  Location: [facility] ENDOSCOPY;  Service: Endoscopy;  Laterality: N/A;   EXCISION BONE CYST Left 07/31/2016   Procedure: EXCISION BONE CYST/exostectomy 28124/left 2nd;  Surgeon: Sharlotte Alamo, DPM;  Location: [facility] ORS;  Service: Podiatry;  Laterality: Left;   EXTRACORPOREAL SHOCK WAVE LITHOTRIPSY Left 09/12/2015   Procedure: EXTRACORPOREAL SHOCK WAVE LITHOTRIPSY (ESWL);  Surgeon: Hollice Espy, MD;  Location: [facility] ORS;  Service: Urology;  Laterality: Left;   FRACTURE SURGERY     left foot   HH repair     JOINT REPLACEMENT Bilateral 2013,2014   total knees   LAPAROSCOPIC HYSTERECTOMY     LITHOTRIPSY     periprosthetic supracondylar fracture of left femur  02/16/2020   [facility]   TONSILLECTOMY     TOTAL KNEE REVISION Right 11/14/2019   Procedure: Revision patella and tibial polyethylene;  Surgeon: Hessie Knows, MD;  Location: [facility] ORS;  Service: Orthopedics;  Laterality: Right;   URETEROSCOPY      Medical History: Past Medical History:  Diagnosis Date   Acid reflux    Anxiety    Arrhythmia    treated with meds and has no current problems   Arthritis    most uncomfortable in knees   Asthma    uses several inhalers   Depression    Fever blister    Hematuria    History of kidney stones    Hypertension    Hypoglycemia    Left flank pain    Migraine    Restless leg    Yeast vaginitis     Family History: Family History  Problem Relation Age of  Onset   Prostate cancer Father    Kidney Stones Father    Kidney disease Neg Hx     Social History: Social History   Socioeconomic History   Marital status: Married    Spouse name: Not on file   Number of children: Not on file   Years of education: Not on file   Highest education level: Not on file  Occupational History   Not on file  Tobacco Use   Smoking status: Never   Smokeless tobacco: Never  Vaping Use   Vaping Use: Never used  Substance and Sexual Activity   Alcohol use: No   Drug use: No   Sexual activity: Not on file  Other Topics Concern   Not on file  Social History Narrative   Not on file   Social Determinants of Health   Financial Resource Strain: Not on file  Food Insecurity: Not on file  Transportation Needs: Not on file  Physical Activity: Not on file  Stress: Not on file  Social Connections: Not on file      Review of Systems  Constitutional:  Positive for activity change. Negative for appetite change, diaphoresis, fatigue and fever.  HENT:  Positive for ear pain, postnasal drip, rhinorrhea, sinus pressure and sneezing. Negative for congestion, ear discharge, facial swelling, hearing loss, nosebleeds and sinus pain.   Eyes:  Negative for photophobia,  pain, discharge, redness, itching and visual disturbance.  Respiratory:  Positive for shortness of breath and wheezing. Negative for cough, choking and chest tightness.   Cardiovascular:  Negative for chest pain and palpitations.  Gastrointestinal:  Negative for abdominal distention, anal bleeding, constipation, diarrhea and nausea.  Endocrine: Negative for heat intolerance and polyuria.  Genitourinary:  Negative for difficulty urinating, menstrual problem, pelvic pain, urgency and vaginal pain.  Musculoskeletal:  Positive for arthralgias, back pain and gait problem. Negative for joint swelling.  Skin:  Negative for color change.  Allergic/Immunologic: Positive for environmental allergies.  Neurological:  Positive for speech difficulty and numbness. Negative for weakness and light-headedness.  Hematological:  Negative for adenopathy. Does not bruise/bleed easily.  Psychiatric/Behavioral:  Positive for sleep disturbance. Negative for agitation and decreased concentration. The patient is not nervous/anxious.     Vital Signs: BP (!) 144/76   Pulse (!) 105   Temp (!) 97.3 F (36.3 C)   Resp 16   Ht _0  (1.6 m)   Wt 168 lb 12.8 oz (76.6 kg)   SpO2 97%   BMI 29.90 kg/m    Physical Exam Constitutional:      Appearance: Normal appearance.  HENT:     Head: Normocephalic and atraumatic.     Right Ear: Tympanic membrane normal.     Left Ear: Tympanic membrane normal.     Nose: Congestion and rhinorrhea present.     Mouth/Throat:     Mouth: Mucous membranes are moist.     Pharynx: No posterior oropharyngeal erythema.  Eyes:     Extraocular Movements: Extraocular movements intact.     Pupils: Pupils are equal, round, and reactive to light.  Cardiovascular:     Rate and Rhythm: Regular rhythm. Tachycardia present.     Pulses: Normal pulses.     Heart sounds: Normal heart sounds.  Pulmonary:     Effort: Pulmonary effort is normal.     Breath sounds: Wheezing present.  Abdominal:      General: Abdomen is flat.     Palpations: Abdomen is soft.  Musculoskeletal:  General: Tenderness present.     Cervical back: Normal range of motion.  Skin:    General: Skin is warm and dry.     Capillary Refill: Capillary refill takes 2 to 3 seconds.  Neurological:     General: No focal deficit present.     Mental Status: She is alert.  Psychiatric:        Mood and Affect: Mood normal.        Behavior: Behavior normal.     LABS: Recent Results (from the past 2160 hour(s))  Pulmonary function test     Status: None   Collection Time: 08/07/20  2:30 PM  Result Value Ref Range   FEV1     FVC     FEV1/FVC     TLC     DLCO    Basic metabolic panel     Status: Abnormal   Collection Time: 08/21/20  4:08 PM  Result Value Ref Range   Sodium 139 135 - 145 mmol/L   Potassium 3.6 3.5 - 5.1 mmol/L   Chloride 101 98 - 111 mmol/L   CO2 25 22 - 32 mmol/L   Glucose, Bld 125 (H) 70 - 99 mg/dL    Comment: Glucose reference range applies only to samples taken after fasting for at least 8 hours.   BUN 11 8 - 23 mg/dL   Creatinine, Ser 0.63 0.44 - 1.00 mg/dL   Calcium 9.6 8.9 - 10.3 mg/dL   GFR, Estimated >60 >60 mL/min    Comment: (NOTE) Calculated using the CKD-EPI Creatinine Equation (2021)    Anion gap 13 5 - 15    Comment: Performed at [facility], [address] [place], [place] [address]  CBC     Status: Abnormal   Collection Time: 08/21/20  4:08 PM  Result Value Ref Range   WBC 9.8 4.0 - 10.5 K/uL   RBC 4.32 3.87 - 5.11 MIL/uL   Hemoglobin 10.9 (L) 12.0 - 15.0 g/dL   HCT 34.4 (L) 36.0 - 46.0 %   MCV 79.6 (L) 80.0 - 100.0 fL   MCH 25.2 (L) 26.0 - 34.0 pg   MCHC 31.7 30.0 - 36.0 g/dL   RDW 17.4 (H) 11.5 - 15.5 %   Platelets 296 150 - 400 K/uL   nRBC 0.0 0.0 - 0.2 %    Comment: Performed at [facility], [place], [place] [address]  Troponin I (High Sensitivity)     Status: None   Collection Time: 08/21/20  4:08 PM  Result Value  Ref Range   Troponin I (High Sensitivity) 5 <18 ng/L    Comment: (NOTE) Elevated high sensitivity troponin I (hsTnI) values and significant  changes across serial measurements may suggest ACS but many other  chronic and acute conditions are known to elevate hsTnI results.  Refer to the "Links" section for chest pain algorithms and additional  guidance. Performed at [facility], [address] [place], [place] [address]   POC SOFIA Antigen FIA     Status: None   Collection Time: 09/03/20  9:51 AM  Result Value Ref Range   SARS Coronavirus 2 Ag Negative Negative  UA/M w/rflx Culture, Routine     Status: Abnormal   Collection Time: 09/16/20 10:13 AM   Specimen: Urine   Urine  Result Value Ref Range   Specific Gravity, UA      >=1.030 (A) 1.005 - 1.030   pH, UA 5.5 5.0 - 7.5  Color, UA Yellow Yellow   Appearance Ur Cloudy (A) Clear   Leukocytes,UA Negative Negative   Protein,UA Trace Negative/Trace   Glucose, UA Negative Negative   Ketones, UA Negative Negative   RBC, UA Negative Negative   Bilirubin, UA Negative Negative   Urobilinogen, Ur 0.2 0.2 - 1.0 mg/dL   Nitrite, UA Negative Negative   Microscopic Examination Comment     Comment: Microscopic follows if indicated.   Microscopic Examination See below:     Comment: Microscopic was indicated and was performed.   Urinalysis Reflex Comment     Comment: This specimen will not reflex to a Urine Culture.  Microscopic Examination     Status: Abnormal   Collection Time: 09/16/20 10:13 AM   Urine  Result Value Ref Range   WBC, UA 0-5 0 - 5 /hpf   RBC 11-30 (A) 0 - 2 /hpf   Epithelial Cells (non renal) 0-10 0 - 10 /hpf   Casts None seen None seen /lpf   Crystals Present (A) N/A   Crystal Type Calcium Oxalate N/A   Bacteria, UA None seen None seen/Few  CBC with Differential/Platelet     Status: Abnormal   Collection Time: 09/16/20 11:19 AM  Result Value Ref Range   WBC 6.1 3.4 - 10.8 x10E3/uL   RBC 4.10 3.77 -  5.28 x10E6/uL   Hemoglobin 10.2 (L) 11.1 - 15.9 g/dL   Hematocrit 31.5 (L) 34.0 - 46.6 %   MCV 77 (L) 79 - 97 fL   MCH 24.9 (L) 26.6 - 33.0 pg   MCHC 32.4 31.5 - 35.7 g/dL   RDW 15.9 (H) 11.7 - 15.4 %   Platelets 352 150 - 450 x10E3/uL   Neutrophils 55 Not Estab. %   Lymphs 31 Not Estab. %   Monocytes 8 Not Estab. %   Eos 5 Not Estab. %   Basos 1 Not Estab. %   Neutrophils Absolute 3.3 1.4 - 7.0 x10E3/uL   Lymphocytes Absolute 1.9 0.7 - 3.1 x10E3/uL   Monocytes Absolute 0.5 0.1 - 0.9 x10E3/uL   EOS (ABSOLUTE) 0.3 0.0 - 0.4 x10E3/uL   Basophils Absolute 0.0 0.0 - 0.2 x10E3/uL   Immature Granulocytes 0 Not Estab. %   Immature Grans (Abs) 0.0 0.0 - 0.1 x10E3/uL  Lipid Panel With LDL/HDL Ratio     Status: Abnormal   Collection Time: 09/16/20 11:19 AM  Result Value Ref Range   Cholesterol, Total 225 (H) 100 - 199 mg/dL   Triglycerides 93 0 - 149 mg/dL   HDL 94 >39 mg/dL   VLDL Cholesterol Cal 16 5 - 40 mg/dL   LDL Chol Calc (NIH) 115 (H) 0 - 99 mg/dL   LDL/HDL Ratio 1.2 0.0 - 3.2 ratio    Comment:                                     LDL/HDL Ratio                                             Men  Women                               1/2 Avg.Risk  1.0    1.5  Avg.Risk  3.6    3.2                                2X Avg.Risk  6.2    5.0                                3X Avg.Risk  8.0    6.1   TSH     Status: None   Collection Time: 09/16/20 11:19 AM  Result Value Ref Range   TSH 2.000 0.450 - 4.500 uIU/mL  T4, free     Status: None   Collection Time: 09/16/20 11:19 AM  Result Value Ref Range   Free T4 1.19 0.82 - 1.77 ng/dL  Comprehensive metabolic panel     Status: Abnormal   Collection Time: 09/16/20 11:19 AM  Result Value Ref Range   Glucose 109 (H) 65 - 99 mg/dL   BUN 14 8 - 27 mg/dL   Creatinine, Ser 0.68 0.57 - 1.00 mg/dL   eGFR 97 >59 mL/min/1.73   BUN/Creatinine Ratio 21 12 - 28   Sodium 139 134 - 144 mmol/L   Potassium 4.2 3.5 - 5.2  mmol/L   Chloride 101 96 - 106 mmol/L   CO2 20 20 - 29 mmol/L   Calcium 9.4 8.7 - 10.3 mg/dL   Total Protein 6.4 6.0 - 8.5 g/dL   Albumin 4.4 3.8 - 4.8 g/dL   Globulin, Total 2.0 1.5 - 4.5 g/dL   Albumin/Globulin Ratio 2.2 1.2 - 2.2   Bilirubin Total <0.2 0.0 - 1.2 mg/dL   Alkaline Phosphatase 122 (H) 44 - 121 IU/L   AST 19 0 - 40 IU/L   ALT 15 0 - 32 IU/L  Iron, TIBC and Ferritin Panel     Status: Abnormal   Collection Time: 09/16/20 11:19 AM  Result Value Ref Range   Total Iron Binding Capacity 454 (H) 250 - 450 ug/dL   UIBC 431 (H) 118 - 369 ug/dL   Iron 23 (L) 27 - 139 ug/dL   Iron Saturation 5 (LL) 15 - 55 %   Ferritin 12 (L) 15 - 150 ng/mL  B12 and Folate Panel     Status: None   Collection Time: 09/16/20 11:19 AM  Result Value Ref Range   Vitamin B-12 331 232 - 1,245 pg/mL   Folate 6.9 >3.0 ng/mL    Comment: A serum folate concentration of less than 3.1 ng/mL is considered to represent clinical deficiency.       Assessment/Plan: 1. Encounter for general adult medical examination with abnormal findings Patient chart is reviewed for continuity of care appropriate age-related and other preventive health maintenance is all  2. Encounter for mammogram to establish baseline mammogram Ordered mammogram today - MM DIGITAL SCREENING BILATERAL; Future  3. Anemia in other chronic diseases classified elsewhere Patient has anemia, has been seen by [facility] we will update her labs and treat appropriately and accordingly - CBC with Differential/Platelet - Iron, TIBC and Ferritin Panel - B12 and Folate Panel  4. Moderate persistent asthma with acute exacerbation Patient has severe symptoms of asthma and allergies she will get benefit from immunotherapy if needed - Allergy Test  5. Seasonal allergic rhinitis due to pollen We will add Singulair and Flonase until she is seen by pulmonary - montelukast (SINGULAIR) 10 MG tablet; Take 1 tablet (10 mg total)  by mouth at bedtime.   Dispense: 90 tablet; Refill: 1 - fluticasone (FLONASE) 50 MCG/ACT nasal spray; Place 2 sprays into both nostrils daily.  Dispense: 16 g; Refill: 6 - Allergy Test  6. Prediabetes Please check hemoglobin A1c on next visit, her hg a1c has been below 6 in the past 1 year - Lipid Panel With LDL/HDL Ratio - TSH - T4, free  7. Other primary ovarian failure Ogechi is ordered today - DG Bone Density; Future  8. Benign hypertension Her blood pressure has been elevated along with her heart rate we will continue to monitor patient is on metoprolol and Cardizem might need increasing her dose - Comprehensive metabolic panel  9. BMI 30.0-30.9,adult Encourage caloric restriction, however patient does have multiple medical problems will further adjust her medications patient is also on high-dose gabapentin which can cause weight gain patient is instructed to reduce dose down to 300 twice a day and might want to go down further no obvious history of neuropathy  10. Mixed hyperlipidemia This has not been addressed in the past we will repeat her levels and manage accordingly  11. Severe recurrent major depression without psychotic features ([place]) Controlled with medications  12. Dysuria - UA/M w/rflx Culture, Routine - Microscopic Examination  13. Barrett's esophagus with low grade dysplasia Patient has been followed by due to due to GI has recent endoscopy done with dilatation and has another one scheduled we will monitor along    General Counseling: Katye verbalizes understanding of the findings of todays visit and agrees with plan of treatment. I have discussed any further diagnostic evaluation that may be needed or ordered today. We also reviewed her medications today. she has been encouraged to call the office with any questions or concerns that should arise related to todays visit.    Counseling:  [place] Controlled Substance Database was reviewed by me.  Orders Placed This Encounter   Procedures   Allergy Test   Microscopic Examination   DG Bone Density   MM DIGITAL SCREENING BILATERAL   UA/M w/rflx Culture, Routine   CBC with Differential/Platelet   Lipid Panel With LDL/HDL Ratio   TSH   T4, free   Comprehensive metabolic panel   Iron, TIBC and Ferritin Panel   B12 and Folate Panel    Meds ordered this encounter  Medications   montelukast (SINGULAIR) 10 MG tablet    Sig: Take 1 tablet (10 mg total) by mouth at bedtime.    Dispense:  90 tablet    Refill:  1   fluticasone (FLONASE) 50 MCG/ACT nasal spray    Sig: Place 2 sprays into both nostrils daily.    Dispense:  16 g    Refill:  6    Total time spent:50 Minutes  Time spent includes review of chart, medications, test results, and follow up plan with the patient.     Lavera Guise, MD  Internal Medicine

## 2020-09-17 ENCOUNTER — Telehealth: Payer: Self-pay

## 2020-09-17 LAB — IRON,TIBC AND FERRITIN PANEL
Ferritin: 12 ng/mL — ABNORMAL LOW (ref 15–150)
Iron Saturation: 5 % — CL (ref 15–55)
Iron: 23 ug/dL — ABNORMAL LOW (ref 27–139)
Total Iron Binding Capacity: 454 ug/dL — ABNORMAL HIGH (ref 250–450)
UIBC: 431 ug/dL — ABNORMAL HIGH (ref 118–369)

## 2020-09-17 LAB — CBC WITH DIFFERENTIAL/PLATELET
Basophils Absolute: 0 10*3/uL (ref 0.0–0.2)
Basos: 1 %
EOS (ABSOLUTE): 0.3 10*3/uL (ref 0.0–0.4)
Eos: 5 %
Hematocrit: 31.5 % — ABNORMAL LOW (ref 34.0–46.6)
Hemoglobin: 10.2 g/dL — ABNORMAL LOW (ref 11.1–15.9)
Immature Grans (Abs): 0 10*3/uL (ref 0.0–0.1)
Immature Granulocytes: 0 %
Lymphocytes Absolute: 1.9 10*3/uL (ref 0.7–3.1)
Lymphs: 31 %
MCH: 24.9 pg — ABNORMAL LOW (ref 26.6–33.0)
MCHC: 32.4 g/dL (ref 31.5–35.7)
MCV: 77 fL — ABNORMAL LOW (ref 79–97)
Monocytes Absolute: 0.5 10*3/uL (ref 0.1–0.9)
Monocytes: 8 %
Neutrophils Absolute: 3.3 10*3/uL (ref 1.4–7.0)
Neutrophils: 55 %
Platelets: 352 10*3/uL (ref 150–450)
RBC: 4.1 x10E6/uL (ref 3.77–5.28)
RDW: 15.9 % — ABNORMAL HIGH (ref 11.7–15.4)
WBC: 6.1 10*3/uL (ref 3.4–10.8)

## 2020-09-17 LAB — B12 AND FOLATE PANEL
Folate: 6.9 ng/mL (ref >3.0)
Vitamin B-12: 331 pg/mL (ref 232–1245)

## 2020-09-17 LAB — UA/M W/RFLX CULTURE, ROUTINE
Bilirubin, UA: NEGATIVE
Glucose, UA: NEGATIVE
Ketones, UA: NEGATIVE
Leukocytes,UA: NEGATIVE
Nitrite, UA: NEGATIVE
RBC, UA: NEGATIVE
Specific Gravity, UA: >=1.03 — AB (ref 1.005–1.030)
Urobilinogen, Ur: 0.2 mg/dL (ref 0.2–1.0)
pH, UA: 5.5 (ref 5.0–7.5)

## 2020-09-17 LAB — COMPREHENSIVE METABOLIC PANEL
ALT: 15 IU/L (ref 0–32)
AST: 19 IU/L (ref 0–40)
Albumin/Globulin Ratio: 2.2 (ref 1.2–2.2)
Albumin: 4.4 g/dL (ref 3.8–4.8)
Alkaline Phosphatase: 122 IU/L — ABNORMAL HIGH (ref 44–121)
BUN/Creatinine Ratio: 21 (ref 12–28)
BUN: 14 mg/dL (ref 8–27)
Bilirubin Total: <0.2 mg/dL (ref 0.0–1.2)
CO2: 20 mmol/L (ref 20–29)
Calcium: 9.4 mg/dL (ref 8.7–10.3)
Chloride: 101 mmol/L (ref 96–106)
Creatinine, Ser: 0.68 mg/dL (ref 0.57–1.00)
Globulin, Total: 2 g/dL (ref 1.5–4.5)
Glucose: 109 mg/dL — ABNORMAL HIGH (ref 65–99)
Potassium: 4.2 mmol/L (ref 3.5–5.2)
Sodium: 139 mmol/L (ref 134–144)
Total Protein: 6.4 g/dL (ref 6.0–8.5)
eGFR: 97 mL/min/{1.73_m2} (ref >59)

## 2020-09-17 LAB — MICROSCOPIC EXAMINATION
Bacteria, UA: NONE SEEN
Casts: NONE SEEN /lpf

## 2020-09-17 LAB — T4, FREE: Free T4: 1.19 ng/dL (ref 0.82–1.77)

## 2020-09-17 LAB — LIPID PANEL WITH LDL/HDL RATIO
Cholesterol, Total: 225 mg/dL — ABNORMAL HIGH (ref 100–199)
HDL: 94 mg/dL (ref >39)
LDL Chol Calc (NIH): 115 mg/dL — ABNORMAL HIGH (ref 0–99)
LDL/HDL Ratio: 1.2 ratio (ref 0.0–3.2)
Triglycerides: 93 mg/dL (ref 0–149)
VLDL Cholesterol Cal: 16 mg/dL (ref 5–40)

## 2020-09-17 LAB — TSH: TSH: 2 u[IU]/mL (ref 0.450–4.500)

## 2020-09-17 NOTE — Telephone Encounter (Signed)
Faxed mammogram & dexa scan order to [facility]

## 2020-09-18 ENCOUNTER — Ambulatory Visit: Payer: Medicare Other | Admitting: Nurse Practitioner

## 2020-09-19 DIAGNOSIS — J449 Chronic obstructive pulmonary disease, unspecified: Secondary | ICD-10-CM | POA: Diagnosis not present

## 2020-09-19 DIAGNOSIS — I10 Essential (primary) hypertension: Secondary | ICD-10-CM | POA: Diagnosis not present

## 2020-09-20 ENCOUNTER — Telehealth: Payer: Self-pay | Admitting: Pharmacist

## 2020-09-20 NOTE — Chronic Care Management (AMB) (Signed)
Care Management   Follow Up Note   09/20/2020 Name: Summer Murphy MRN: [id number] DOB: [birth year]   Referred by: Lavera Guise, MD Reason for referral : Chronic Care Management   Monthly RPM Report    [place]  Name: Summer Murphy  Date of Birth: 06-14-1955  Gender: Female  Primary provider: [facility] Bigness  RPM Coordinator: Charlann Lange  ICD Code: I10 Device ID: [id number] [place]: iBP Measurement Type: blood pressure Days of Monitoring: 19 Documentation Time: 40 Min 2 Sec BLOOD PRESSURE  Measurement Date: 09/19/2020 Thursday at 83:38 PM Systolic / Diastolic (mmHg): 250 / 88 Measurement Date: 09/18/2020 Wednesday at 53:97 PM Systolic / Diastolic (mmHg): 673 / 79 Measurement Date: 09/17/2020 Tuesday at 41:93 PM Systolic / Diastolic (mmHg): 790 / 75 Measurement Date: 09/16/2020 Monday at 24:09 AM Systolic / Diastolic (mmHg): 735 / 81 Measurement Date: 09/15/2020 Sunday at 32:99 PM Systolic / Diastolic (mmHg): 242 / 75 Measurement Date: 09/14/2020 Saturday at 68:34 PM Systolic / Diastolic (mmHg): 196 / 74 Measurement Date: 09/13/2020 Friday at 22:29 PM Systolic / Diastolic (mmHg): 798 / 67 Measurement Date: 09/12/2020 Thursday at 92:11 AM Systolic / Diastolic (mmHg): 941 / 75 Measurement Date: 09/11/2020 Wednesday at 74:08 AM Systolic / Diastolic (mmHg): 144 / 83 Measurement Date: 09/10/2020 Tuesday at 81:85 PM Systolic / Diastolic (mmHg): 631 / 75 Measurement Date: 09/08/2020 Sunday at 49:70 PM Systolic / Diastolic (mmHg): 263 / 89 Measurement Date: 09/06/2020 Friday at 78:58 AM Systolic / Diastolic (mmHg): 850 / 85 Measurement Date: 09/05/2020 Thursday at 27:74 AM Systolic / Diastolic (mmHg): 128 / 91 Measurement Date: 09/04/2020 Wednesday at 78:67 AM Systolic / Diastolic (mmHg): 672 / 88 Measurement Date: 09/03/2020 Tuesday at 09:47 PM Systolic / Diastolic (mmHg): 096 / 80 Measurement Date: 09/02/2020 Monday at 28:36 PM Systolic / Diastolic (mmHg): 629  / 83 Measurement Date: 09/02/2020 Monday at 47:65 AM Systolic / Diastolic (mmHg): 465 / 87 Measurement Date: 09/01/2020 Sunday at 03:54 PM Systolic / Diastolic (mmHg): 656 / 74 Measurement Date: 09/01/2020 Sunday at 81:27 AM Systolic / Diastolic (mmHg): 517 / 92 Measurement Date: 08/31/2020 Saturday at 00:17 AM Systolic / Diastolic (mmHg): 494 / 79 Measurement Date: 08/30/2020 Friday at 49:67 PM Systolic / Diastolic (mmHg): 591 / 69  PULSE  Measurement Date: 09/19/2020 Thursday at 07:57 PM Pulse (IN BPM): 95 Measurement Date: 09/18/2020 Wednesday at 07:14 PM Pulse (IN BPM): 97 Measurement Date: 09/17/2020 Tuesday at 03:50 PM Pulse (IN BPM): 89 Measurement Date: 09/16/2020 Monday at 09:09 AM Pulse (IN BPM): 93 Measurement Date: 09/15/2020 Sunday at 02:00 PM Pulse (IN BPM): 121 Measurement Date: 09/14/2020 Saturday at 08:16 PM Pulse (IN BPM): 91 Measurement Date: 09/13/2020 Friday at 02:27 PM Pulse (IN BPM): 102 Measurement Date: 09/12/2020 Thursday at 11:55 AM Pulse (IN BPM): 103 Measurement Date: 09/11/2020 Wednesday at 11:44 AM Pulse (IN BPM): 104 Measurement Date: 09/10/2020 Tuesday at 02:53 PM Pulse (IN BPM): 108 Measurement Date: 09/08/2020 Sunday at 01:28 PM Pulse (IN BPM): 100 Measurement Date: 09/06/2020 Friday at 09:42 AM Pulse (IN BPM): 103 Measurement Date: 09/05/2020 Thursday at 08:40 AM Pulse (IN BPM): 96 Measurement Date: 09/04/2020 Wednesday at 09:47 AM Pulse (IN BPM): 102 Measurement Date: 09/03/2020 Tuesday at 12:19 PM Pulse (IN BPM): 110 Measurement Date: 09/02/2020 Monday at 12:21 PM Pulse (IN BPM): 94 Measurement Date: 09/02/2020 Monday at 10:28 AM Pulse (IN BPM): 105 Measurement Date: 09/01/2020 Sunday at 06:14 PM Pulse (IN BPM): 101 Measurement Date: 09/01/2020 Sunday at 09:11 AM Pulse (IN BPM): 100 Measurement Date:  08/31/2020 Saturday at 10:46 AM Pulse (IN BPM): 103 Measurement Date: 08/30/2020 Friday at 05:18 PM Pulse (IN BPM): 96        Notes for  June-2022 : 40 Min 2 Sec  Date and Time: 09/19/2020 Thursday at 02:09 PM User: Leata Mouse Time logged: 08:00 Notes: DATA REVIEW SYSTOLIC BP  Automatically transmitted data is reviewed today from device. (ID: [id number]).  Systolic BP reading is >637 for 0 % of time  Systolic BP reading is between 160 to 179 for 0 % of time  Systolic BP reading is between 140 to 159 for 45.0 % of time  Systolic BP reading is between 120-139 for 55.0 % of time  Systolic BP reading is between 101-120 for 10.0 % of time  Systolic BP reading is <858 for 0 % of time  DIASTOLIC BP  Diastolic BP reading is elevated >100 for 0 % of time  Diastolic BP reading is between 90-99 for 10.0 % of time  PULSE  Pulse is >120 for 5.0 % of time  Pulse is <50 for 0 % of time  COMPLIANCE NOTES BLOOD PRESSURE  Patient is compliant - 16 days of readings obtained.  MANAGEMENT NOTES We will continue to monitor BP readings for now  Medications for BP reviewed for this patient  Patient has no symptoms associated with high or low readings   Date and Time: 09/19/2020 Thursday at 01:56 PM User: Charlann Lange Time logged: 10:02 Notes: System Generated Message - This is an edited note. Please see original note with Reference [id number]. COMPLIANCE NOTES BLOOD PRESSURE  Patient is compliant - 16 days of readings obtained.  REVIEW NOTES Blood pressure is getting under better control  Morning BP readings are elevated sometimes  BP numbers are stable except some elevations  Heart rate/Pulse data reviewed  "Spoke with the patient on the phone today"   Date and Time: 09/19/2020 Thursday at 01:16 PM User: Charlann Lange Time logged: 00:00 Notes: System Generated Message (Reference [id number] ) - This note has been edited . COMPLIANCE NOTES BLOOD PRESSURE  Patient is compliant - 16 days of readings obtained.  REVIEW NOTES Blood pressure is getting under better control  Morning BP  readings are elevated sometimes  BP numbers are stable except some elevations  Heart rate/Pulse data reviewed   Date and Time: 09/18/2020 Wednesday at 03:07 PM User: Charlann Lange Time logged: 02:00 Notes: REVIEW NOTES All the daily Blood pressure data noted since last review  Heart rate/Pulse data reviewed   Date and Time: 09/17/2020 Tuesday at 10:15 AM User: Leata Mouse Time logged: 06:00 Notes: System Generated Message - This is an edited note. Please see original note with Reference [id number]. MANAGEMENT NOTES Follow up in regards to critical alert for pulse 121 on 09/15/20.  We will continue to monitor BP readings for now  No change on the treatment plan  Advised patient to check BP and pulse daily, or if she is symptomatic with racing heart  She has not seen 3 hearts for irregular heartbeat, but advised her to watch out for this and to contact us.   Date and Time: 09/17/2020 Tuesday at 10:13 AM User: Leata Mouse Time logged: 00:00 Notes: System Generated Message (Reference [id number] ) - This note has been edited . MANAGEMENT NOTES Follow up in regards to critical alert for pulse 121 on 09/15/20.  We will continue to monitor BP readings for now  No change on the treatment plan  Advised patient to check BP and  pulse daily, or if she is symptomatic with racing heart  She has not seen 3 hearts for irregular heartbeat, but advised her to watch out for this and to contact us.   Date and Time: 09/16/2020 Monday at 11:36 AM User: Charlann Lange Time logged: 02:00 Notes: REVIEW NOTES All the daily Blood pressure data noted since last review  Heart rate/Pulse data reviewed   Date and Time: 09/13/2020 Friday at 02:39 PM User: Charlann Lange Time logged: 02:00 Notes: REVIEW NOTES All the daily Blood pressure data noted since last review  Heart rate/Pulse data reviewed   Date and Time: 09/12/2020 Thursday at 04:03 PM User: Charlann Lange Time logged: 02:00 Notes: REVIEW NOTES All the daily Blood pressure data noted since last review  Heart rate/Pulse data reviewed   Date and Time: 09/11/2020 Wednesday at 02:32 PM User: Charlann Lange Time logged: 02:00 Notes: REVIEW NOTES All the daily Blood pressure data noted since last review  Heart rate/Pulse data reviewed   Date and Time: 09/09/2020 Monday at 11:35 AM User: Charlann Lange Time logged: 02:00 Notes: REVIEW NOTES All the daily Blood pressure data noted since last review  Heart rate/Pulse data reviewed   Date and Time: 09/06/2020 Friday at 04:12 PM User: Charlann Lange Time logged: 00:00 Notes: REVIEW NOTES All the daily Blood pressure data noted since last review   Date and Time: 09/06/2020 Friday at 04:12 PM User: Charlann Lange Time logged: 02:00 Notes: REVIEW NOTES Heart rate/Pulse data reviewed   Date and Time: 09/02/2020 Monday at 12:49 PM User: Charlann Lange Time logged: 02:00 Notes: REVIEW NOTES All the daily Blood pressure data noted since last review  Heart rate/Pulse data reviewed   Date and Time: 09/02/2020 Monday at 10:00 AM User: Noemi Chapel Time logged: 00:00 Notes: on 08/30/2020, Patient was newly enrolled in RPM. I instructed the patient how to use his/her device. the patient was told to use the device 16 days a month, The patient was instructed to take her BP every morning two hours after their medication time, if the patient's BP is elevated the patient is instructed to take their BP again that evening. The patient is instructed to drink plenty of water and increase their steps. Patient was instructed not take their BP near appliances, to sit up in chair with a back to it, feet flat on the floor, and no talking while taking their BP. The patient is not to take the cuff off until the device says OK. The patient will replace the batteries when needed. The patient is always to relax 5 mins before  taking BP. The team will monitor the patient, and notify the PCP of any critical alerts. The patient understood all the instructions. 20 mins   Date and Time: 08/23/2020 Friday at 04:41 PM User: Christean Grief Time logged: 00:00 Notes: Activated Device on 08/23/2020.  Verbal Consent Obtained on August 23, 2020. Patient was notified about Remote Patient Monitoring (RPM) program offered by our medical practice to improve the patient care, co-ordination of care, patient education, provide quality of care, reduce the cost and provide personalized care. Patient was informed that this is Medicare approved program and can be furnished by only one provider, can be stopped by the patient anytime. Patient has verbally consented to enroll in this program and our designated care coordinator to contact by phone as needed at least for 20 minutes a month. Practice also provides 24/7 care to the patient needs. All patient questions were answered and patient was  enrolled in the RPM program.

## 2020-09-22 ENCOUNTER — Other Ambulatory Visit: Payer: Self-pay | Admitting: Internal Medicine

## 2020-09-22 DIAGNOSIS — G63 Polyneuropathy in diseases classified elsewhere: Secondary | ICD-10-CM

## 2020-09-25 ENCOUNTER — Telehealth: Payer: Self-pay

## 2020-09-25 NOTE — Telephone Encounter (Signed)
Called and spoke to pt and advised that her labs will be discussed at her next OV on 10/15/20

## 2020-09-26 DIAGNOSIS — M8588 Other specified disorders of bone density and structure, other site: Secondary | ICD-10-CM | POA: Diagnosis not present

## 2020-09-26 DIAGNOSIS — Z1231 Encounter for screening mammogram for malignant neoplasm of breast: Secondary | ICD-10-CM | POA: Diagnosis not present

## 2020-09-26 DIAGNOSIS — M85852 Other specified disorders of bone density and structure, left thigh: Secondary | ICD-10-CM | POA: Diagnosis not present

## 2020-09-30 ENCOUNTER — Telehealth: Payer: Self-pay

## 2020-09-30 NOTE — Telephone Encounter (Signed)
Patient was scheduled on 09/26/20 at 1:30p at [facility] for screening mammogram and Bone Density. Loma Sousa

## 2020-10-02 DIAGNOSIS — Z882 Allergy status to sulfonamides status: Secondary | ICD-10-CM | POA: Diagnosis not present

## 2020-10-02 DIAGNOSIS — K209 Esophagitis, unspecified without bleeding: Secondary | ICD-10-CM | POA: Diagnosis not present

## 2020-10-02 DIAGNOSIS — K449 Diaphragmatic hernia without obstruction or gangrene: Secondary | ICD-10-CM | POA: Diagnosis not present

## 2020-10-02 DIAGNOSIS — Z09 Encounter for follow-up examination after completed treatment for conditions other than malignant neoplasm: Secondary | ICD-10-CM | POA: Diagnosis not present

## 2020-10-02 DIAGNOSIS — K2270 Barrett's esophagus without dysplasia: Secondary | ICD-10-CM | POA: Diagnosis not present

## 2020-10-02 DIAGNOSIS — I10 Essential (primary) hypertension: Secondary | ICD-10-CM | POA: Diagnosis not present

## 2020-10-02 DIAGNOSIS — J449 Chronic obstructive pulmonary disease, unspecified: Secondary | ICD-10-CM | POA: Diagnosis not present

## 2020-10-02 DIAGNOSIS — Z9889 Other specified postprocedural states: Secondary | ICD-10-CM | POA: Diagnosis not present

## 2020-10-02 DIAGNOSIS — Z8719 Personal history of other diseases of the digestive system: Secondary | ICD-10-CM | POA: Diagnosis not present

## 2020-10-07 ENCOUNTER — Telehealth: Payer: Self-pay

## 2020-10-07 ENCOUNTER — Other Ambulatory Visit: Payer: Self-pay | Admitting: Internal Medicine

## 2020-10-07 DIAGNOSIS — Z96653 Presence of artificial knee joint, bilateral: Secondary | ICD-10-CM | POA: Diagnosis not present

## 2020-10-07 DIAGNOSIS — E1165 Type 2 diabetes mellitus with hyperglycemia: Secondary | ICD-10-CM | POA: Diagnosis not present

## 2020-10-07 DIAGNOSIS — T84032D Mechanical loosening of internal right knee prosthetic joint, subsequent encounter: Secondary | ICD-10-CM | POA: Diagnosis not present

## 2020-10-07 DIAGNOSIS — M978XXA Periprosthetic fracture around other internal prosthetic joint, initial encounter: Secondary | ICD-10-CM | POA: Diagnosis not present

## 2020-10-07 NOTE — Telephone Encounter (Signed)
Done see other note 

## 2020-10-07 NOTE — Progress Notes (Signed)
Take another half tab of metoprolol and follow up in am

## 2020-10-07 NOTE — Telephone Encounter (Signed)
Pt advised take metoprolol take half extra today and she is not  sure she is taking she will call back and make her appt tomorrow advised if symptoms worse need to go to ED

## 2020-10-08 ENCOUNTER — Encounter: Payer: Self-pay | Admitting: Physician Assistant

## 2020-10-08 ENCOUNTER — Other Ambulatory Visit: Payer: Self-pay

## 2020-10-08 ENCOUNTER — Ambulatory Visit (INDEPENDENT_AMBULATORY_CARE_PROVIDER_SITE_OTHER): Payer: Medicare Other | Admitting: Physician Assistant

## 2020-10-08 DIAGNOSIS — F332 Major depressive disorder, recurrent severe without psychotic features: Secondary | ICD-10-CM

## 2020-10-08 DIAGNOSIS — D638 Anemia in other chronic diseases classified elsewhere: Secondary | ICD-10-CM | POA: Diagnosis not present

## 2020-10-08 DIAGNOSIS — R0602 Shortness of breath: Secondary | ICD-10-CM

## 2020-10-08 DIAGNOSIS — I10 Essential (primary) hypertension: Secondary | ICD-10-CM | POA: Diagnosis not present

## 2020-10-08 DIAGNOSIS — D649 Anemia, unspecified: Secondary | ICD-10-CM

## 2020-10-08 DIAGNOSIS — R002 Palpitations: Secondary | ICD-10-CM | POA: Diagnosis not present

## 2020-10-08 MED ORDER — METOPROLOL TARTRATE 25 MG PO TABS: 25.0000 mg | ORAL_TABLET | Freq: Two times a day (BID) | ORAL | 0 refills | Status: DC

## 2020-10-08 NOTE — Chronic Care Management (AMB) (Signed)
error 

## 2020-10-08 NOTE — Progress Notes (Signed)
[facility] [address], [place] [address]  Internal MEDICINE  Office Visit Note  Patient Name: Summer Murphy  [id number]  [id number]  Date of Service: 10/13/2020  Chief Complaint  Patient presents with   Acute Visit    PALPITATION , NO CHEST PAIN     HPI Pt is here for a sick visit for palpitations without CP. -Psych changed her rexulti from 4mg  to 2mg  last month. Was told 4mg  was too much and since this change has begun having these episodes of palpitations. -Reports that they are now going to increase her rexulti to 3mg  to see if this helps and pt states she is picking up this script today. She plans to follow up with pysch provider. -Needs metoprolol refill today. Normally takes in BID, however instructed to take extra 1/2 tab when palpitations get worse. -EKG done in office is comparable to previous and HR is stable. Likely due to anxiety and medication dose change. -Wheezing some and uses her combivent -Saw GI last week, did scope and said to follow up in 1 year  Current Medication:  Outpatient Encounter Medications as of 10/08/2020  Medication Sig   busPIRone (BUSPAR) 10 MG tablet Take 10 mg by mouth 2 (two) times daily.   clotrimazole-betamethasone (LOTRISONE) cream Apply 1 application topically 2 (two) times daily.   COMBIVENT RESPIMAT 20-100 MCG/ACT AERS respimat INHALE 1 PUFF INTO THE LUNGS EVERY 6 HOURS   diclofenac sodium (VOLTAREN) 1 % GEL Apply 2 g topically in the morning, at noon, and at bedtime.   diltiazem (DILACOR XR) 180 MG 24 hr capsule Take 1 capsule (180 mg total) by mouth daily.   diphenhydrAMINE (BENADRYL) 25 MG tablet Take 50 mg by mouth 2 (two) times daily as needed.    diphenoxylate-atropine (LOMOTIL) 2.5-0.025 MG tablet Take 1 tablet by mouth 3 (three) times daily as needed for diarrhea or loose stools.   DULoxetine (CYMBALTA) 60 MG capsule Take 60 mg by mouth daily.   famotidine (PEPCID) 40 MG tablet Take 40 mg by mouth  daily.   fluticasone (FLONASE) 50 MCG/ACT nasal spray Place 2 sprays into both nostrils daily.   fluticasone furoate-vilanterol (BREO ELLIPTA) 200-25 MCG/INH AEPB Inhale 1 puff into the lungs daily.   gabapentin (NEURONTIN) 600 MG tablet TAKE 1 TABLET BY MOUTH TWICE DAILY   ipratropium-albuterol (DUONEB) 0.5-2.5 (3) MG/3ML SOLN Take 3 mLs by nebulization every 4 (four) hours as needed (for shortness of breath/wheezing).   loratadine (CLARITIN) 10 MG tablet Take 1 tablet (10 mg total) by mouth daily.   montelukast (SINGULAIR) 10 MG tablet Take 1 tablet (10 mg total) by mouth at bedtime.   omeprazole (PRILOSEC) 40 MG capsule TAKE 1 CAPSULE(40 MG) BY MOUTH TWICE DAILY BEFORE MEALS   ondansetron (ZOFRAN) 4 MG tablet Take 1 tablet (4 mg total) by mouth every 6 (six) hours as needed for nausea.   pantoprazole (PROTONIX) 40 MG tablet TAKE 1 TABLET(40 MG) BY MOUTH TWICE DAILY   rOPINIRole (REQUIP) 0.5 MG tablet Take two tablets po BID for restless legs.   SUMAtriptan (IMITREX) 100 MG tablet Take 1 tablet (100 mg total) by mouth every 2 (two) hours as needed (for migraine headaches.). May repeat in 1 hours if headache persists or recurs.   traZODone (DESYREL) 150 MG tablet Take 150-300 mg by mouth at bedtime.    valACYclovir (VALTREX) 500 MG tablet Take 1 tablet (500 mg total) by mouth daily as needed (for fever blisters).   [DISCONTINUED] metoprolol  tartrate (LOPRESSOR) 25 MG tablet Take 1 tablet (25 mg total) by mouth 2 (two) times daily.   Biotin 800 MCG TABS Take 1 tablet (800 mcg total) by mouth daily. (Patient not taking: Reported on 10/08/2020)   Brexpiprazole 4 MG TABS Take 4 mg by mouth daily.  (Patient not taking: Reported on 10/08/2020)   diclofenac Sodium (VOLTAREN) 1 % GEL Apply 4 g topically 4 (four) times daily. (Patient not taking: Reported on 10/08/2020)   docusate sodium (COLACE) 100 MG capsule Take 1 capsule (100 mg total) by mouth 2 (two) times daily. (Patient not taking: Reported on  10/08/2020)   metoprolol tartrate (LOPRESSOR) 25 MG tablet Take 1 tablet (25 mg total) by mouth 2 (two) times daily.   nystatin (MYCOSTATIN) 100000 UNIT/ML suspension Take 5 mLs (500,000 Units total) by mouth 4 (four) times daily. (Patient not taking: Reported on 10/08/2020)   oxybutynin (DITROPAN) 5 MG tablet Take 1 tablet (5 mg total) by mouth in the morning and at bedtime. (Patient not taking: Reported on 10/08/2020)   No facility-administered encounter medications on file as of 10/08/2020.      Medical History: Past Medical History:  Diagnosis Date   Acid reflux    Anxiety    Arrhythmia    treated with meds and has no current problems   Arthritis    most uncomfortable in knees   Asthma    uses several inhalers   Depression    Fever blister    Hematuria    History of kidney stones    Hypertension    Hypoglycemia    Left flank pain    Migraine    Restless leg    Yeast vaginitis      Vital Signs: BP 121/67   Pulse 69   Temp 97.6 F (36.4 C)   Ht 5\' 3"  (1.6 m)   Wt 167 lb (75.8 kg)   HC 16" (40.6 cm)   SpO2 94%   BMI 29.58 kg/m    Review of Systems  Constitutional:  Negative for fatigue and fever.  HENT:  Negative for congestion, mouth sores and postnasal drip.   Respiratory:  Positive for wheezing. Negative for cough.   Cardiovascular:  Positive for palpitations. Negative for chest pain.  Genitourinary:  Negative for flank pain.  Psychiatric/Behavioral:  The patient is nervous/anxious.    Physical Exam Vitals and nursing note reviewed.  Constitutional:      General: She is not in acute distress.    Appearance: She is well-developed. She is not diaphoretic.  HENT:     Head: Normocephalic and atraumatic.     Mouth/Throat:     Pharynx: No oropharyngeal exudate.  Eyes:     Pupils: Pupils are equal, round, and reactive to light.  Neck:     Thyroid: No thyromegaly.     Vascular: No JVD.     Trachea: No tracheal deviation.  Cardiovascular:     Rate and  Rhythm: Normal rate and regular rhythm.     Heart sounds: Normal heart sounds. No murmur heard.   No friction rub. No gallop.  Pulmonary:     Effort: Pulmonary effort is normal. No respiratory distress.     Breath sounds: No wheezing or rales.  Chest:     Chest wall: No tenderness.  Abdominal:     General: Bowel sounds are normal.     Palpations: Abdomen is soft.  Musculoskeletal:        General: Normal range of motion.  Cervical back: Normal range of motion and neck supple.  Lymphadenopathy:     Cervical: No cervical adenopathy.  Skin:    General: Skin is warm and dry.  Neurological:     Mental Status: She is alert and oriented to person, place, and time.     Cranial Nerves: No cranial nerve deficit.  Psychiatric:        Behavior: Behavior normal.        Thought Content: Thought content normal.        Judgment: Judgment normal.      Assessment/Plan: 1. Essential hypertension Stable, Continue metoprolol and diltiazem - metoprolol tartrate (LOPRESSOR) 25 MG tablet; Take 1 tablet (25 mg total) by mouth 2 (two) times daily.  Dispense: 30 tablet; Refill: 0  2. Palpitations Likely secondary to change dosage of rexulti managed by pysch--dose being adjusted again and will hopefully improve symptoms. Will continue metoprolol BID, however will take extra 1/2 tab as needed for episodes of increasing palpitations - EKG 12-Lead - metoprolol tartrate (LOPRESSOR) 25 MG tablet; Take 1 tablet (25 mg total) by mouth 2 (two) times daily.  Dispense: 30 tablet; Refill: 0  3. 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 Anemia in other chronic diseases classified elsewhere Low hemoglobin, followed by GI who pt reports did a scope recently and was told she could f/u annually now. Will recheck labs to ensure H&H not dropping further - CBC w/Diff/Platelet   General Counseling: Franklin verbalizes understanding of the findings of todays visit and  agrees with plan of treatment. I have discussed any further diagnostic evaluation that may be needed or ordered today. We also reviewed her medications today. she has been encouraged to call the office with any questions or concerns that should arise related to todays visit.    Counseling:    Orders Placed This Encounter  Procedures   CBC w/Diff/Platelet   EKG 12-Lead    Meds ordered this encounter  Medications   metoprolol tartrate (LOPRESSOR) 25 MG tablet    Sig: Take 1 tablet (25 mg total) by mouth 2 (two) times daily.    Dispense:  30 tablet    Refill:  0     Time spent:35 Minutes

## 2020-10-11 ENCOUNTER — Other Ambulatory Visit: Payer: Self-pay | Admitting: Hospice and Palliative Medicine

## 2020-10-11 DIAGNOSIS — I10 Essential (primary) hypertension: Secondary | ICD-10-CM | POA: Diagnosis not present

## 2020-10-11 DIAGNOSIS — D649 Anemia, unspecified: Secondary | ICD-10-CM | POA: Diagnosis not present

## 2020-10-12 DIAGNOSIS — J449 Chronic obstructive pulmonary disease, unspecified: Secondary | ICD-10-CM | POA: Diagnosis not present

## 2020-10-12 DIAGNOSIS — M978XXD Periprosthetic fracture around other internal prosthetic joint, subsequent encounter: Secondary | ICD-10-CM | POA: Diagnosis not present

## 2020-10-12 DIAGNOSIS — M6281 Muscle weakness (generalized): Secondary | ICD-10-CM | POA: Diagnosis not present

## 2020-10-12 LAB — CBC WITH DIFFERENTIAL/PLATELET
Basophils Absolute: 0.1 10*3/uL (ref 0.0–0.2)
Basos: 1 %
EOS (ABSOLUTE): 0.3 10*3/uL (ref 0.0–0.4)
Eos: 4 %
Hematocrit: 34 % (ref 34.0–46.6)
Hemoglobin: 10.5 g/dL — ABNORMAL LOW (ref 11.1–15.9)
Immature Grans (Abs): 0 10*3/uL (ref 0.0–0.1)
Immature Granulocytes: 1 %
Lymphocytes Absolute: 1.8 10*3/uL (ref 0.7–3.1)
Lymphs: 27 %
MCH: 24.3 pg — ABNORMAL LOW (ref 26.6–33.0)
MCHC: 30.9 g/dL — ABNORMAL LOW (ref 31.5–35.7)
MCV: 79 fL (ref 79–97)
Monocytes Absolute: 0.4 10*3/uL (ref 0.1–0.9)
Monocytes: 6 %
Neutrophils Absolute: 4.1 10*3/uL (ref 1.4–7.0)
Neutrophils: 61 %
Platelets: 300 10*3/uL (ref 150–450)
RBC: 4.32 x10E6/uL (ref 3.77–5.28)
RDW: 15.8 % — ABNORMAL HIGH (ref 11.7–15.4)
WBC: 6.6 10*3/uL (ref 3.4–10.8)

## 2020-10-14 ENCOUNTER — Other Ambulatory Visit: Payer: Self-pay | Admitting: Orthopedic Surgery

## 2020-10-14 MED ORDER — METOPROLOL TARTRATE 25 MG PO TABS: 25.0000 mg | ORAL_TABLET | Freq: Two times a day (BID) | ORAL | 1 refills | Status: DC

## 2020-10-15 ENCOUNTER — Ambulatory Visit (INDEPENDENT_AMBULATORY_CARE_PROVIDER_SITE_OTHER): Payer: Medicare Other | Admitting: Nurse Practitioner

## 2020-10-15 ENCOUNTER — Encounter: Payer: Self-pay | Admitting: Nurse Practitioner

## 2020-10-15 ENCOUNTER — Other Ambulatory Visit: Payer: Self-pay

## 2020-10-15 VITALS — BP 128/84 | HR 61 | Temp 97.6°F | Resp 16 | Ht 63.0 in | Wt 173.0 lb

## 2020-10-15 DIAGNOSIS — Z23 Encounter for immunization: Secondary | ICD-10-CM

## 2020-10-15 DIAGNOSIS — D508 Other iron deficiency anemias: Secondary | ICD-10-CM | POA: Diagnosis not present

## 2020-10-15 DIAGNOSIS — E538 Deficiency of other specified B group vitamins: Secondary | ICD-10-CM

## 2020-10-15 LAB — LIPID PANEL WITH LDL/HDL RATIO
Cholesterol, Total: 166 mg/dL (ref 100–199)
HDL: 63 mg/dL (ref >39)
LDL Chol Calc (NIH): 88 mg/dL (ref 0–99)
LDL/HDL Ratio: 1.4 ratio (ref 0.0–3.2)
Triglycerides: 81 mg/dL (ref 0–149)
VLDL Cholesterol Cal: 15 mg/dL (ref 5–40)

## 2020-10-15 MED ORDER — CYANOCOBALAMIN 1000 MCG/ML IJ SOLN
1000.0000 ug | Freq: Once | INTRAMUSCULAR | Status: AC
Start: 2020-10-15 — End: 2020-10-15
  Administered 2020-10-15: 1000 ug via INTRAMUSCULAR

## 2020-10-15 MED ORDER — ZOSTER VAC RECOMB ADJUVANTED 50 MCG/0.5ML IM SUSR: 0.5000 mL | Freq: Once | INTRAMUSCULAR | 0 refills | Status: DC

## 2020-10-15 MED ORDER — ZOSTER VAC RECOMB ADJUVANTED 50 MCG/0.5ML IM SUSR
0.5000 mL | Freq: Once | INTRAMUSCULAR | 0 refills | Status: AC
Start: 2020-10-15 — End: 2020-10-15

## 2020-10-15 NOTE — Progress Notes (Signed)
[facility] [place], [place] [address]  Internal MEDICINE  Office Visit Note  Patient Name: Summer Murphy  [id number]  [id number]  Date of Service: 10/15/2020  Chief Complaint  Patient presents with   Follow-up    Lab results    HPI Lillymae presents for a follow up visit to discuss her lab results. According to her CBC and iron studies, she has microcytic hypochromic anemia with severely low iron and ferritin level. Her vitamin B12 is also low normal at 331. She reports having fatigue, low energy, and feeling tired. She has an allergy to oral iron supplements.     Current Medication: Outpatient Encounter Medications as of 10/15/2020  Medication Sig   Biotin 800 MCG TABS Take 1 tablet (800 mcg total) by mouth daily.   Brexpiprazole 4 MG TABS Take 4 mg by mouth daily.   busPIRone (BUSPAR) 10 MG tablet Take 10 mg by mouth 2 (two) times daily.   clotrimazole-betamethasone (LOTRISONE) cream Apply 1 application topically 2 (two) times daily.   COMBIVENT RESPIMAT 20-100 MCG/ACT AERS respimat INHALE 1 PUFF INTO THE LUNGS EVERY 6 HOURS   diclofenac sodium (VOLTAREN) 1 % GEL Apply 2 g topically in the morning, at noon, and at bedtime.   diclofenac Sodium (VOLTAREN) 1 % GEL Apply 4 g topically 4 (four) times daily. (Patient not taking: Reported on 10/18/2020)   diltiazem (DILACOR XR) 180 MG 24 hr capsule Take 1 capsule (180 mg total) by mouth daily.   diphenhydrAMINE (BENADRYL) 25 MG tablet Take 50 mg by mouth 2 (two) times daily as needed.    diphenoxylate-atropine (LOMOTIL) 2.5-0.025 MG tablet Take 1 tablet by mouth 3 (three) times daily as needed for diarrhea or loose stools.   docusate sodium (COLACE) 100 MG capsule Take 1 capsule (100 mg total) by mouth 2 (two) times daily.   DULoxetine (CYMBALTA) 60 MG capsule Take 60 mg by mouth daily.   famotidine (PEPCID) 40 MG tablet Take 40 mg by mouth daily.   fluticasone (FLONASE) 50 MCG/ACT nasal spray Place 2 sprays  into both nostrils daily.   fluticasone furoate-vilanterol (BREO ELLIPTA) 200-25 MCG/INH AEPB Inhale 1 puff into the lungs daily.   gabapentin (NEURONTIN) 600 MG tablet TAKE 1 TABLET BY MOUTH TWICE DAILY   ipratropium-albuterol (DUONEB) 0.5-2.5 (3) MG/3ML SOLN Take 3 mLs by nebulization every 4 (four) hours as needed (for shortness of breath/wheezing).   loratadine (CLARITIN) 10 MG tablet Take 1 tablet (10 mg total) by mouth daily.   metoprolol tartrate (LOPRESSOR) 25 MG tablet Take 1 tablet (25 mg total) by mouth 2 (two) times daily.   montelukast (SINGULAIR) 10 MG tablet Take 1 tablet (10 mg total) by mouth at bedtime.   nystatin (MYCOSTATIN) 100000 UNIT/ML suspension Take 5 mLs (500,000 Units total) by mouth 4 (four) times daily. (Patient not taking: Reported on 10/18/2020)   ondansetron (ZOFRAN) 4 MG tablet Take 1 tablet (4 mg total) by mouth every 6 (six) hours as needed for nausea.   oxybutynin (DITROPAN) 5 MG tablet Take 1 tablet (5 mg total) by mouth in the morning and at bedtime.   rOPINIRole (REQUIP) 0.5 MG tablet Take two tablets po BID for restless legs.   SUMAtriptan (IMITREX) 100 MG tablet Take 1 tablet (100 mg total) by mouth every 2 (two) hours as needed (for migraine headaches.). May repeat in 1 hours if headache persists or recurs.   traZODone (DESYREL) 150 MG tablet Take 150-300 mg by mouth at bedtime.  valACYclovir (VALTREX) 500 MG tablet Take 1 tablet (500 mg total) by mouth daily as needed (for fever blisters). (Patient not taking: Reported on 10/18/2020)   [DISCONTINUED] omeprazole (PRILOSEC) 40 MG capsule TAKE 1 CAPSULE(40 MG) BY MOUTH TWICE DAILY BEFORE MEALS   [DISCONTINUED] pantoprazole (PROTONIX) 40 MG tablet TAKE 1 TABLET(40 MG) BY MOUTH TWICE DAILY   [DISCONTINUED] Zoster Vaccine Adjuvanted [facility]) injection Inject 0.5 mLs into the muscle once.   [EXPIRED] Zoster Vaccine Adjuvanted [facility]) injection Inject 0.5 mLs into the muscle once for 1 dose.   [EXPIRED]  cyanocobalamin ((VITAMIN B-12)) injection 1,000 mcg    No facility-administered encounter medications on file as of 10/15/2020.    Surgical History: Past Surgical History:  Procedure Laterality Date   ABDOMINAL HYSTERECTOMY     AMPUTATION TOE Left 07/31/2016   Procedure: AMPUTATION TOE/MPJ 2nd toe;  Surgeon: Sharlotte Alamo, DPM;  Location: [facility] ORS;  Service: Podiatry;  Laterality: Left;   APPENDECTOMY  1990   BACK SURGERY     low back   BREAST SURGERY     bilateral breast reduction   CARDIAC ELECTROPHYSIOLOGY STUDY AND ABLATION     CHOLECYSTECTOMY  1990   COLONOSCOPY WITH PROPOFOL N/A 01/04/2017   Procedure: COLONOSCOPY WITH PROPOFOL;  Surgeon: Manya Silvas, MD;  Location: [facility] ENDOSCOPY;  Service: Endoscopy;  Laterality: N/A;   CORNEAL TRANSPLANT     ESOPHAGOGASTRODUODENOSCOPY (EGD) WITH PROPOFOL N/A 01/04/2017   Procedure: ESOPHAGOGASTRODUODENOSCOPY (EGD) WITH PROPOFOL;  Surgeon: Manya Silvas, MD;  Location: [facility] END[facility]COPY;  Service: Endoscopy;  Laterality: N/A;   EXCISION BONE CYST Left 07/31/2016   Procedure: EXCISION BONE CYST/exostectomy 28124/left 2nd;  Surgeon: Sharlotte Alamo, DPM;  Location: [facility] ORS;  Service: Podiatry;  Laterality: Left;   EXTRACORPOREAL SHOCK WAVE LITHOTRIPSY Left 09/12/2015   Procedure: EXTRACORPOREAL SHOCK WAVE LITHOTRIPSY (ESWL);  Surgeon: Hollice Espy, MD;  Location: [facility] ORS;  Service: Urology;  Laterality: Left;   FRACTURE SURGERY     left foot   HH repair     JOINT REPLACEMENT Bilateral 2013,2014   total knees   LAPAROSCOPIC HYSTERECTOMY     LITHOTRIPSY     periprosthetic supracondylar fracture of left femur  02/16/2020   [facility]   TONSILLECTOMY     TOTAL KNEE REVISION Right 11/14/2019   Procedure: Revision patella and tibial polyethylene;  Surgeon: Hessie Knows, MD;  Location: [facility] ORS;  Service: Orthopedics;  Laterality: Right;   URETEROSCOPY      Medical History: Past Medical History:  Diagnosis Date   Acid reflux     Anxiety    Arrhythmia    treated with meds and has no current problems   Arthritis    most uncomfortable in knees   Asthma    uses several inhalers   Depression    Fever blister    Hematuria    History of kidney stones    Hypertension    Hypoglycemia    Left flank pain    Migraine    Restless leg    Yeast vaginitis     Family History: Family History  Problem Relation Age of Onset   Hypertension Mother    Stroke Mother    Prostate cancer Father    Kidney Stones Father    Diabetes Brother    Hypertension Brother    Breast cancer Maternal Aunt    Kidney disease Neg Hx     Social History   Socioeconomic History   Marital status: Widowed    Spouse name: Not on  file   Number of children: Not on file   Years of education: Not on file   Highest education level: Not on file  Occupational History   Not on file  Tobacco Use   Smoking status: Never   Smokeless tobacco: Never  Vaping Use   Vaping Use: Never used  Substance and Sexual Activity   Alcohol use: No   Drug use: No   Sexual activity: Not on file  Other Topics Concern   Not on file  Social History Narrative   Not on file   Social Determinants of Health   Financial Resource Strain: Not on file  Food Insecurity: Not on file  Transportation Needs: Not on file  Physical Activity: Not on file  Stress: Not on file  Social Connections: Not on file  Intimate Partner Violence: Not on file      Review of Systems  Constitutional:  Positive for fatigue. Negative for chills and unexpected weight change.  HENT:  Negative for congestion, rhinorrhea, sneezing and sore throat.   Eyes:  Negative for redness.  Respiratory:  Negative for cough, chest tightness, shortness of breath and wheezing.   Cardiovascular:  Negative for chest pain and palpitations.  Gastrointestinal:  Negative for abdominal pain, constipation, diarrhea, nausea and vomiting.  Genitourinary:  Negative for dysuria and frequency.   Musculoskeletal:  Negative for arthralgias, back pain, joint swelling and neck pain.  Skin:  Negative for rash.  Neurological: Negative.  Negative for tremors and numbness.  Hematological:  Negative for adenopathy. Does not bruise/bleed easily.  Psychiatric/Behavioral:  Negative for behavioral problems (Depression), sleep disturbance and suicidal ideas. The patient is not nervous/anxious.    Vital Signs: BP 128/84   Pulse 61   Temp 97.6 F (36.4 C)   Resp 16   Ht '5\' 3"'$  (1.6 m)   Wt 173 lb (78.5 kg)   SpO2 95%   BMI 30.65 kg/m    Physical Exam Vitals reviewed.  Constitutional:      General: She is not in acute distress.    Appearance: She is well-developed. She is not diaphoretic.  HENT:     Head: Normocephalic and atraumatic.     Mouth/Throat:     Pharynx: No oropharyngeal exudate.  Eyes:     Pupils: Pupils are equal, round, and reactive to light.  Neck:     Thyroid: No thyromegaly.     Vascular: No JVD.     Trachea: No tracheal deviation.  Cardiovascular:     Rate and Rhythm: Normal rate and regular rhythm.     Heart sounds: Normal heart sounds. No murmur heard.   No friction rub. No gallop.  Pulmonary:     Effort: Pulmonary effort is normal. No respiratory distress.     Breath sounds: No wheezing or rales.  Chest:     Chest wall: No tenderness.  Abdominal:     General: Bowel sounds are normal.     Palpations: Abdomen is soft.  Musculoskeletal:        General: Normal range of motion.     Cervical back: Normal range of motion and neck supple.  Lymphadenopathy:     Cervical: No cervical adenopathy.  Skin:    General: Skin is warm and dry.  Neurological:     Mental Status: She is alert and oriented to person, place, and time.     Cranial Nerves: No cranial nerve deficit.  Psychiatric:        Behavior: Behavior normal.  Thought Content: Thought content normal.        Judgment: Judgment normal.     Assessment/Plan: 1. Other iron deficiency  anemia Persistent iron deficiency anemia, referred to hematology - Ambulatory referral to Hematology / Oncology  2. B12 deficiency Low normal B12 level, patient has significant fatigue that can be attributed to IDA but will also administer B12 injection in office today.  - cyanocobalamin ((VITAMIN B-12)) injection 1,000 mcg  3. Need for shingles vaccine Vaccine order sent to patient's pharmacy.  - Zoster Vaccine Adjuvanted [facility]) injection; Inject 0.5 mLs into the muscle once for 1 dose.  Dispense: 0.5 mL; Refill: 0   General Counseling: Vanellope verbalizes understanding of the findings of todays visit and agrees with plan of treatment. I have discussed any further diagnostic evaluation that may be needed or ordered today. We also reviewed her medications today. she has been encouraged to call the office with any questions or concerns that should arise related to todays visit.    Orders Placed This Encounter  Procedures   Ambulatory referral to Hematology / Oncology    Meds ordered this encounter  Medications   cyanocobalamin ((VITAMIN B-12)) injection 1,000 mcg   Zoster Vaccine Adjuvanted [facility]) injection    Sig: Inject 0.5 mLs into the muscle once for 1 dose.    Dispense:  0.5 mL    Refill:  0    Return in about 1 month (around 11/15/2020) for F/U, B12 Kipp Laurence PCP.   Total time spent:30 Minutes Time spent includes review of chart, medications, test results, and follow up plan with the patient.   [place] Controlled Substance Database was reviewed by me.  This patient was seen by Jonetta Osgood, FNP-C in collaboration with Dr. Clayborn Bigness as a part of collaborative care agreement.   Lyriq Jarchow R. Valetta Fuller, MSN, FNP-C Internal medicine

## 2020-10-16 ENCOUNTER — Telehealth: Payer: Self-pay | Admitting: Pharmacist

## 2020-10-16 NOTE — Chronic Care Management (AMB) (Signed)
Chronic Care Management   Outreach Note  10/16/2020 Name: Summer Murphy MRN: [id number] DOB: 12/28/1955  Referred by: Lavera Guise, MD Reason for referral : Chronic Care Management (Monthly RPM reports)    Summer Murphy  Name: Summer Murphy  Date of Birth: 22-May-1955  Gender: Female  Primary provider: Clayborn Bigness  RPM Coordinator: Charlann Lange  ICD Code: I10 Device ID: [id number] [place]: iBP Measurement Type: blood pressure Days of Monitoring: 21 Documentation Time: 60 Min 0 Sec BLOOD PRESSURE  Measurement Date: 10/16/2020 Wednesday at 0000000 AM Systolic / Diastolic (mmHg): 123XX123 / 85 Measurement Date: 10/15/2020 Tuesday at Q000111Q PM Systolic / Diastolic (mmHg): A999333 / 72 Measurement Date: 10/15/2020 Tuesday at [id number] PM Systolic / Diastolic (mmHg): XX123456 / 66 Measurement Date: 10/15/2020 Tuesday at 123456 AM Systolic / Diastolic (mmHg): A999333 / 76 Measurement Date: 10/14/2020 Monday at [id number] PM Systolic / Diastolic (mmHg): A999333 / 72 Measurement Date: 10/14/2020 Monday at [id number] AM Systolic / Diastolic (mmHg): A999333 / 86 Measurement Date: 10/13/2020 Sunday at [id number] PM Systolic / Diastolic (mmHg): [id number] / 69 Measurement Date: 10/13/2020 Sunday at [id number] PM Systolic / Diastolic (mmHg): 0000000 / 77 Measurement Date: 10/12/2020 Saturday at 0000000 PM Systolic / Diastolic (mmHg): 0000000 / 74 Measurement Date: 10/12/2020 Saturday at [id number] PM Systolic / Diastolic (mmHg): 88 / 67 Measurement Date: 10/12/2020 Saturday at 0000000 PM Systolic / Diastolic (mmHg): 91 / 56 Measurement Date: 10/12/2020 Saturday at [id number] PM Systolic / Diastolic (mmHg): 85 / 49 Measurement Date: 10/12/2020 Saturday at [id number] AM Systolic / Diastolic (mmHg): Q000111Q / 86 Measurement Date: 10/11/2020 Friday at [id number] PM Systolic / Diastolic (mmHg): 0000000 / 69 Measurement Date: 10/11/2020 Friday at [id number] PM Systolic / Diastolic (mmHg): 99991111 / 62 Measurement Date: 10/11/2020 Friday at 0000000 AM Systolic / Diastolic (mmHg): Q000111Q /  77 Measurement Date: 10/10/2020 Thursday at [id number] PM Systolic / Diastolic (mmHg): 0000000 / 97 Measurement Date: 10/10/2020 Thursday at [id number] PM Systolic / Diastolic (mmHg): 0000000 / [id number] Measurement Date: 10/10/2020 Thursday at [id number] PM Systolic / Diastolic (mmHg): 0000000 / 92 Measurement Date: 10/10/2020 Thursday at 123XX123 PM Systolic / Diastolic (mmHg): Q000111Q / 99 Measurement Date: 10/10/2020 Thursday at [id number] PM Systolic / Diastolic (mmHg): 99991111 / 87 Measurement Date: 10/10/2020 Thursday at 0000000 PM Systolic / Diastolic (mmHg): 123456 / 84 Measurement Date: 10/10/2020 Thursday at 0000000 AM Systolic / Diastolic (mmHg): Q000111Q / 91 Measurement Date: 10/10/2020 Thursday at Q000111Q AM Systolic / Diastolic (mmHg): Q000111Q / [id number] Measurement Date: 10/10/2020 Thursday at 123456 AM Systolic / Diastolic (mmHg): XX123456 / 84 Measurement Date: 10/09/2020 Wednesday at Q000111Q PM Systolic / Diastolic (mmHg): A999333 / 77 Measurement Date: 10/09/2020 Wednesday at [id number] PM Systolic / Diastolic (mmHg): 123456 / 77 Measurement Date: 10/09/2020 Wednesday at 123456 PM Systolic / Diastolic (mmHg): A999333 / 70 Measurement Date: 10/09/2020 Wednesday at [id number] AM Systolic / Diastolic (mmHg): 0000000 / 90 Measurement Date: 10/08/2020 Tuesday at 123456 PM Systolic / Diastolic (mmHg): A999333 / 76 Measurement Date: 10/08/2020 Tuesday at [id number] AM Systolic / Diastolic (mmHg): 123456 / 86 Measurement Date: 10/08/2020 Tuesday at [id number] AM Systolic / Diastolic (mmHg): 0000000 / 98 Measurement Date: 10/07/2020 Monday at 0000000 PM Systolic / Diastolic (mmHg): 123456 / 77 Measurement Date: 10/07/2020 Monday at [id number] AM Systolic / Diastolic (mmHg): 0000000 / 88 Measurement Date: 10/07/2020 Monday at 0000000 AM Systolic / Diastolic (mmHg): 123456 / 93 Measurement Date: 10/06/2020 Sunday at 0000000 PM Systolic / Diastolic (mmHg): 0000000 / 79 Measurement Date: 10/06/2020 Sunday at A999333 PM Systolic /  Diastolic (mmHg): 0000000 / 90 Measurement Date: 10/06/2020 Sunday at [id number] AM Systolic / Diastolic (mmHg): 123456 /  85 Measurement Date: 10/05/2020 Saturday at 123XX123 PM Systolic / Diastolic (mmHg): [id number] / 73 Measurement Date: 10/04/2020 Friday at 123XX123 PM Systolic / Diastolic (mmHg): A999333 / 81 Measurement Date: 10/03/2020 Thursday at 123456 AM Systolic / Diastolic (mmHg): 123456 / 89 Measurement Date: 10/02/2020 Wednesday at [id number] PM Systolic / Diastolic (mmHg): 123XX123 / 73 Measurement Date: 10/01/2020 Tuesday at [id number] PM Systolic / Diastolic (mmHg): A999333 / 76 Measurement Date: 09/30/2020 Monday at [id number] AM Systolic / Diastolic (mmHg): 123456 / 82 Measurement Date: 09/27/2020 Friday at 123XX123 PM Systolic / Diastolic (mmHg): Q000111Q / 80 Measurement Date: 09/26/2020 Thursday at 123456 PM Systolic / Diastolic (mmHg): 0000000 / 89 Measurement Date: 09/24/2020 Tuesday at A999333 PM Systolic / Diastolic (mmHg): 123456 / 68 Measurement Date: 09/20/2020 Friday at [id number] PM Systolic / Diastolic (mmHg): Q000111Q / 77  PULSE  Measurement Date: 10/16/2020 Wednesday at 10:32 AM Pulse (IN BPM): 81 Measurement Date: 10/15/2020 Tuesday at 11:27 PM Pulse (IN BPM): 69 Measurement Date: 10/15/2020 Tuesday at 08:41 PM Pulse (IN BPM): 70 Measurement Date: 10/15/2020 Tuesday at 08:39 AM Pulse (IN BPM): 77 Measurement Date: 10/14/2020 Monday at 09:19 PM Pulse (IN BPM): 69 Measurement Date: 10/14/2020 Monday at 10:03 AM Pulse (IN BPM): 80 Measurement Date: 10/13/2020 Sunday at 10:08 PM Pulse (IN BPM): 84 Measurement Date: 10/13/2020 Sunday at 02:29 PM Pulse (IN BPM): 106 Measurement Date: 10/12/2020 Saturday at 09:04 PM Pulse (IN BPM): 74 Measurement Date: 10/12/2020 Saturday at 06:08 PM Pulse (IN BPM): 69 Measurement Date: 10/12/2020 Saturday at 06:07 PM Pulse (IN BPM): 76 Measurement Date: 10/12/2020 Saturday at 05:57 PM Pulse (IN BPM): 75 Measurement Date: 10/12/2020 Saturday at 10:06 AM Pulse (IN BPM): 94 Measurement Date: 10/11/2020 Friday at 09:06 PM Pulse (IN BPM): 77 Measurement Date: 10/11/2020 Friday at 03:29 PM Pulse (IN BPM): 86 Measurement  Date: 10/11/2020 Friday at 11:43 AM Pulse (IN BPM): 77 Measurement Date: 10/10/2020 Thursday at 08:36 PM Pulse (IN BPM): 71 Measurement Date: 10/10/2020 Thursday at 08:31 PM Pulse (IN BPM): 73 Measurement Date: 10/10/2020 Thursday at 07:59 PM Pulse (IN BPM): 74 Measurement Date: 10/10/2020 Thursday at 07:02 PM Pulse (IN BPM): 73 Measurement Date: 10/10/2020 Thursday at 06:21 PM Pulse (IN BPM): 78 Measurement Date: 10/10/2020 Thursday at 04:06 PM Pulse (IN BPM): 88 Measurement Date: 10/10/2020 Thursday at 08:42 AM Pulse (IN BPM): 83 Measurement Date: 10/10/2020 Thursday at 08:20 AM Pulse (IN BPM): 45 Measurement Date: 10/10/2020 Thursday at 02:47 AM Pulse (IN BPM): 85 Measurement Date: 10/09/2020 Wednesday at 09:50 PM Pulse (IN BPM): 72 Measurement Date: 10/09/2020 Wednesday at 08:41 PM Pulse (IN BPM): 80 Measurement Date: 10/09/2020 Wednesday at 05:38 PM Pulse (IN BPM): 71 Measurement Date: 10/09/2020 Wednesday at 08:04 AM Pulse (IN BPM): 84 Measurement Date: 10/08/2020 Tuesday at 09:33 PM Pulse (IN BPM): 81 Measurement Date: 10/08/2020 Tuesday at 10:23 AM Pulse (IN BPM): 78 Measurement Date: 10/08/2020 Tuesday at 08:30 AM Pulse (IN BPM): 85 Measurement Date: 10/07/2020 Monday at 07:46 PM Pulse (IN BPM): 85 Measurement Date: 10/07/2020 Monday at 11:03 AM Pulse (IN BPM): 101 Measurement Date: 10/07/2020 Monday at 09:26 AM Pulse (IN BPM): 94 Measurement Date: 10/06/2020 Sunday at 09:25 PM Pulse (IN BPM): 99 Measurement Date: 10/06/2020 Sunday at 09:19 PM Pulse (IN BPM): 103 Measurement Date: 10/06/2020 Sunday at 08:58 AM Pulse (IN BPM): 105 Measurement Date: 10/05/2020 Saturday at 12:20 PM Pulse (IN BPM): 102 Measurement Date: 10/04/2020 Friday at 04:11 PM  Pulse (IN BPM): 98 Measurement Date: 10/03/2020 Thursday at 08:35 AM Pulse (IN BPM): 90 Measurement Date: 10/02/2020 Wednesday at 06:31 PM Pulse (IN BPM): 106 Measurement Date: 10/01/2020 Tuesday at 02:09 PM Pulse (IN BPM):  101 Measurement Date: 09/30/2020 Monday at 11:22 AM Pulse (IN BPM): 107 Measurement Date: 09/27/2020 Friday at 12:10 PM Pulse (IN BPM): 94 Measurement Date: 09/26/2020 Thursday at 12:00 PM Pulse (IN BPM): 91 Measurement Date: 09/24/2020 Tuesday at 12:11 PM Pulse (IN BPM): 97 Measurement Date: 09/20/2020 Friday at 12:23 PM Pulse (IN BPM): 100        Notes for July-2022 : 47 Min 0 Sec  Date and Time: 10/16/2020 Wednesday at 12:24 PM User: Leata Mouse Time logged: 05:00 Notes: DATA REVIEW SYSTOLIC BP  Automatically transmitted data is reviewed today from device. (ID: [id number]).  Systolic BP reading is XX123456 for 0 % of time  Systolic BP reading is between 160 to 179 for 8.33 % of time  Systolic BP reading is between 140 to 159 for 43.75 % of time  Systolic BP reading is between 120-139 for 37.5 % of time  Systolic BP reading is between 101-120 for 8.33 % of time  Systolic BP reading is [id number] for 6.25 % of time  DIASTOLIC BP  Diastolic BP reading is elevated >100 for 4.17 % of time  Diastolic BP reading is between 90-99 for 16.67 % of time  PULSE  Pulse is >120 for 0 % of time  Pulse is <50 for 2.08 % of time  COMPLIANCE NOTES BLOOD PRESSURE  Patient is compliant - 16 days of readings obtained.  MANAGEMENT NOTES We will continue to monitor BP readings for now  Medications for BP reviewed for this patient  BP medicine recently adjusted  Communicated with the provider regarding critical alerts  Medication compliance has been discussed with the patient   Date and Time: 10/15/2020 Tuesday at 01:37 PM User: Charlann Lange Time logged: 02:00 Notes: REVIEW NOTES All the daily Blood pressure data noted since last review  Heart rate/Pulse data reviewed   Date and Time: 10/14/2020 Monday at 04:32 PM User: Charlann Lange Time logged: 10:00 Notes: REVIEW NOTES All the daily Blood pressure data noted since last review  Heart rate/Pulse data  reviewed   Date and Time: 10/11/2020 Friday at 03:36 PM User: Charlann Lange Time logged: 04:00 Notes: COMPLIANCE NOTES BLOOD PRESSURE  Patient is compliant - 16 days of readings obtained.  REVIEW NOTES All the daily Blood pressure data noted since last review  Heart rate/Pulse data reviewed   Date and Time: 10/10/2020 Thursday at 02:58 PM User: Charlann Lange Time logged: 02:00 Notes: REVIEW NOTES All the daily Blood pressure data noted since last review  Heart rate/Pulse data reviewed   Date and Time: 10/10/2020 Thursday at 10:02 AM User: Leata Mouse Time logged: 10:00 Notes: MANAGEMENT NOTES Follow up in regards to critical alert for low pulse and irregular heart beat on 7/21.  Patient has been experiencing irregular heart beats and had checked out at MD office, will consult with PCP for cardiology referral.  Discovered patient was taking beta blocker prn and discussed medication adherence.  Patient to start taking BID as directed.  We will continue to monitor BP readings for now  Communicated with the provider regarding critical alerts  Medication compliance has been discussed with the patient   Date and Time: 10/09/2020 Wednesday at 03:54 PM User: Charlann Lange Time logged: 02:00 Notes: REVIEW NOTES All the daily Blood pressure data noted since  last review  Heart rate/Pulse data reviewed   Date and Time: 10/08/2020 Tuesday at 04:21 PM User: Charlann Lange Time logged: 02:00 Notes: REVIEW NOTES All the daily Blood pressure data noted since last review  Heart rate/Pulse data reviewed   Date and Time: 10/07/2020 Monday at 04:04 PM User: Charlann Lange Time logged: 02:00 Notes: REVIEW NOTES All the daily Blood pressure data noted since last review  Heart rate/Pulse data reviewed   Date and Time: 10/03/2020 Thursday at 09:01 AM User: Charlann Lange Time logged: 04:00 Notes: REVIEW NOTES All the daily Blood pressure data  noted since last review  Heart rate/Pulse data reviewed   Date and Time: 10/01/2020 Tuesday at 03:34 PM User: Charlann Lange Time logged: 02:00 Notes: REVIEW NOTES All the daily Blood pressure data noted since last review  Heart rate/Pulse data reviewed   Date and Time: 09/30/2020 Monday at 03:40 PM User: Charlann Lange Time logged: 02:00 Notes: REVIEW NOTES All the daily Blood pressure data noted since last review  Heart rate/Pulse data reviewed   Date and Time: 09/27/2020 Friday at 03:06 PM User: Charlann Lange Time logged: 02:00 Notes: REVIEW NOTES All the daily Blood pressure data noted since last review  Heart rate/Pulse data reviewed   Date and Time: 09/26/2020 Thursday at 01:35 PM User: Charlann Lange Time logged: 02:00 Notes: REVIEW NOTES All the daily Blood pressure data noted since last review  Heart rate/Pulse data reviewed   Date and Time: 09/25/2020 Wednesday at 04:06 PM User: Charlann Lange Time logged: 02:00 Notes: REVIEW NOTES All the daily Blood pressure data noted since last review  Heart rate/Pulse data reviewed   Date and Time: 09/24/2020 Tuesday at 03:37 PM User: Charlann Lange Time logged: 02:00 Notes: REVIEW NOTES All the daily Blood pressure data noted since last review  Heart rate/Pulse data reviewed   Date and Time: 09/20/2020 Friday at 02:23 PM User: Charlann Lange Time logged: 02:00 Notes: REVIEW NOTES All the daily Blood pressure data noted since last review  Heart rate/Pulse data reviewed  Verbal Consent Obtained on August 23, 2020. Patient was notified about Remote Patient Monitoring (RPM) program offered by our medical practice to improve the patient care, co-ordination of care, patient education, provide quality of care, reduce the cost and provide personalized care. Patient was informed that this is Medicare approved program and can be furnished by only one provider, can be stopped by the  patient anytime. Patient has verbally consented to enroll in this program and our designated care coordinator to contact by phone as needed at least for 20 minutes a month. Practice also provides 24/7 care to the patient needs. All patient questions were answered and patient was enrolled in the RPM program.

## 2020-10-18 ENCOUNTER — Inpatient Hospital Stay: Payer: Medicare Other

## 2020-10-18 ENCOUNTER — Telehealth: Payer: Self-pay | Admitting: *Deleted

## 2020-10-18 ENCOUNTER — Inpatient Hospital Stay: Payer: Medicare Other | Attending: Oncology | Admitting: Oncology

## 2020-10-18 ENCOUNTER — Other Ambulatory Visit: Payer: Self-pay

## 2020-10-18 ENCOUNTER — Encounter: Payer: Self-pay | Admitting: Oncology

## 2020-10-18 VITALS — BP 126/61 | HR 68 | Temp 98.0°F | Resp 18 | Ht 63.0 in | Wt 166.7 lb

## 2020-10-18 DIAGNOSIS — D509 Iron deficiency anemia, unspecified: Secondary | ICD-10-CM | POA: Insufficient documentation

## 2020-10-18 DIAGNOSIS — R5383 Other fatigue: Secondary | ICD-10-CM | POA: Diagnosis not present

## 2020-10-18 DIAGNOSIS — K5909 Other constipation: Secondary | ICD-10-CM | POA: Diagnosis not present

## 2020-10-18 DIAGNOSIS — K2270 Barrett's esophagus without dysplasia: Secondary | ICD-10-CM | POA: Diagnosis not present

## 2020-10-18 NOTE — Telephone Encounter (Signed)
Pt will receive Benadryl as premed when she is here. Unable to reach pt. Detailed VM left.

## 2020-10-18 NOTE — Progress Notes (Signed)
Patient here to establish care  

## 2020-10-18 NOTE — Telephone Encounter (Signed)
Patient called stating Dr Tasia Catchings told her to take Benadryl before her iron infusions but she did not say how soon before the infusion or dose to take. Please advise, her first infusion is Monday

## 2020-10-19 ENCOUNTER — Encounter: Payer: Self-pay | Admitting: Oncology

## 2020-10-19 NOTE — Progress Notes (Signed)
Hematology/Oncology Consult note [facility] [phone] [phone]   Patient Care Team: Lavera Guise, MD as PCP - General (Internal Medicine) Edythe Clarity, [facility] as Pharmacist (Pharmacist)  REFERRING PROVIDER: Jonetta Osgood, NP  CHIEF COMPLAINTS/REASON FOR VISIT:  Evaluation of iron deficiency anemia  HISTORY OF PRESENTING ILLNESS:   Summer Murphy is a  65 y.o.  female with PMH listed below was seen in consultation at the request of  Jonetta Osgood, NP  for evaluation of iron deficiency anemia.  Reviewed patient's previous labs.  Anemia is chronic, date back to August 2021.  09/16/20 TIBC 454, ferritin 12, iron saturation 5,  10/11/20 Hemoglobin 10.5,   Patient history of Barrett's esophagus, has had multiple EGDs.  Recent EGDs listed below.  05/29/2020 upper EGD Barrett's esophagus. Treated with argon plasma  coagulation (APC).   - Benign-appearing esophageal stenosis. Dilated.  - LA Grade B reflux esophagitis with no bleeding.  - An anti-reflux surgical site was found herniated  above the diaphragm.   07/24/2020 upper EGD showed Barrett's esophagus. Treated with argon plasma coagulation (APC). Benign-appearing esophageal stenosis. Dilated.      - An anti-reflux surgical site was found.   - Large hiatal hernia.   10/02/2020 upper EGD showed There is no endoscopic evidence of Barrett's esophagus. Biopsied. Large hiatal hernia, - An anti-reflux surgical site was found. - A large amount of food (residue) in the stomach.  Stomach high cardia biopsy showed cardio-oxyntic type mucosa with faveolar hyperplasia.  Esophagus biopsy showed squamous esophageal mucosa with chronic esophagitis and increased intraepithelial eosinophils.   She has had colonoscopy in 2018, procedure was aborted due to poor prep.   Patient reports fatigue. Denies any black or bloody stool. She has chronic constipation and can not tolerate oral iron  supplementation.   Review of Systems  Constitutional:  Positive for fatigue. Negative for appetite change, chills and fever.  HENT:   Negative for hearing loss and voice change.   Eyes:  Negative for eye problems.  Respiratory:  Negative for chest tightness and cough.   Cardiovascular:  Negative for chest pain.  Gastrointestinal:  Negative for abdominal distention, abdominal pain and blood in stool.  Endocrine: Negative for hot flashes.  Genitourinary:  Negative for difficulty urinating and frequency.   Musculoskeletal:  Negative for arthralgias.  Skin:  Negative for itching and rash.  Neurological:  Negative for extremity weakness.  Hematological:  Negative for adenopathy.  Psychiatric/Behavioral:  Negative for confusion.    MEDICAL HISTORY:  Past Medical History:  Diagnosis Date   Acid reflux    Anxiety    Arrhythmia    treated with meds and has no current problems   Arthritis    most uncomfortable in knees   Asthma    uses several inhalers   Depression    Fever blister    Hematuria    History of kidney stones    Hypertension    Hypoglycemia    Left flank pain    Migraine    Restless leg    Yeast vaginitis     SURGICAL HISTORY: Past Surgical History:  Procedure Laterality Date   ABDOMINAL HYSTERECTOMY     AMPUTATION TOE Left 07/31/2016   Procedure: AMPUTATION TOE/MPJ 2nd toe;  Surgeon: Sharlotte Alamo, DPM;  Location: [facility] ORS;  Service: Podiatry;  Laterality: Left;   APPENDECTOMY  1990   BACK SURGERY     low back   BREAST SURGERY     bilateral breast  reduction   CARDIAC ELECTROPHYSIOLOGY STUDY AND ABLATION     CHOLECYSTECTOMY  1990   COLONOSCOPY WITH PROPOFOL N/A 01/04/2017   Procedure: COLONOSCOPY WITH PROPOFOL;  Surgeon: Manya Silvas, MD;  Location: [facility] ENDOSCOPY;  Service: Endoscopy;  Laterality: N/A;   CORNEAL TRANSPLANT     ESOPHAGOGASTRODUODENOSCOPY (EGD) WITH PROPOFOL N/A 01/04/2017   Procedure: ESOPHAGOGASTRODUODENOSCOPY (EGD) WITH PROPOFOL;   Surgeon: Manya Silvas, MD;  Location: [facility] ENDOSCOPY;  Service: Endoscopy;  Laterality: N/A;   EXCISION BONE CYST Left 07/31/2016   Procedure: EXCISION BONE CYST/exostectomy 28124/left 2nd;  Surgeon: Sharlotte Alamo, DPM;  Location: [facility] ORS;  Service: Podiatry;  Laterality: Left;   EXTRACORPOREAL SHOCK WAVE LITHOTRIPSY Left 09/12/2015   Procedure: EXTRACORPOREAL SHOCK WAVE LITHOTRIPSY (ESWL);  Surgeon: Hollice Espy, MD;  Location: [facility] ORS;  Service: Urology;  Laterality: Left;   FRACTURE SURGERY     left foot   HH repair     JOINT REPLACEMENT Bilateral 2013,2014   total knees   LAPAROSCOPIC HYSTERECTOMY     LITHOTRIPSY     periprosthetic supracondylar fracture of left femur  02/16/2020   [facility]   TONSILLECTOMY     TOTAL KNEE REVISION Right 11/14/2019   Procedure: Revision patella and tibial polyethylene;  Surgeon: Hessie Knows, MD;  Location: [facility] ORS;  Service: Orthopedics;  Laterality: Right;   URETEROSCOPY      SOCIAL HISTORY: Social History   Socioeconomic History   Marital status: Widowed    Spouse name: Not on file   Number of children: Not on file   Years of education: Not on file   Highest education level: Not on file  Occupational History   Not on file  Tobacco Use   Smoking status: Never   Smokeless tobacco: Never  Vaping Use   Vaping Use: Never used  Substance and Sexual Activity   Alcohol use: No   Drug use: No   Sexual activity: Not on file  Other Topics Concern   Not on file  Social History Narrative   Not on file   Social Determinants of Health   Financial Resource Strain: Not on file  Food Insecurity: Not on file  Transportation Needs: Not on file  Physical Activity: Not on file  Stress: Not on file  Social Connections: Not on file  Intimate Partner Violence: Not on file    FAMILY HISTORY: Family History  Problem Relation Age of Onset   Hypertension Mother    Stroke Mother    Prostate cancer Father    Kidney Stones Father     Diabetes Brother    Hypertension Brother    Breast cancer Maternal Aunt    Kidney disease Neg Hx     ALLERGIES:  is allergic to librium [chlordiazepoxide], metoclopramide, vanilla, aspirin, nsaids, azithromycin, buprenorphine hcl, chlordiazepoxide hcl, morphine, sulfa antibiotics, tolmetin, amlodipine besylate, and iron.  MEDICATIONS:  Current Outpatient Medications  Medication Sig Dispense Refill   Biotin 800 MCG TABS Take 1 tablet (800 mcg total) by mouth daily. 90 tablet 1   Brexpiprazole 4 MG TABS Take 4 mg by mouth daily.     busPIRone (BUSPAR) 10 MG tablet Take 10 mg by mouth 2 (two) times daily.     clotrimazole-betamethasone (LOTRISONE) cream Apply 1 application topically 2 (two) times daily. 45 g 3   COMBIVENT RESPIMAT 20-100 MCG/ACT AERS respimat INHALE 1 PUFF INTO THE LUNGS EVERY 6 HOURS 4 g 3   diclofenac sodium (VOLTAREN) 1 % GEL Apply 2 g topically in the  morning, at noon, and at bedtime.     diltiazem (DILACOR XR) 180 MG 24 hr capsule Take 1 capsule (180 mg total) by mouth daily. 90 capsule 1   diphenhydrAMINE (BENADRYL) 25 MG tablet Take 50 mg by mouth 2 (two) times daily as needed.      diphenoxylate-atropine (LOMOTIL) 2.5-0.025 MG tablet Take 1 tablet by mouth 3 (three) times daily as needed for diarrhea or loose stools. 30 tablet 0   docusate sodium (COLACE) 100 MG capsule Take 1 capsule (100 mg total) by mouth 2 (two) times daily. 30 capsule 0   DULoxetine (CYMBALTA) 60 MG capsule Take 60 mg by mouth daily.     famotidine (PEPCID) 40 MG tablet Take 40 mg by mouth daily.     fluticasone (FLONASE) 50 MCG/ACT nasal spray Place 2 sprays into both nostrils daily. 16 g 6   fluticasone furoate-vilanterol (BREO ELLIPTA) 200-25 MCG/INH AEPB Inhale 1 puff into the lungs daily. 60 each 3   gabapentin (NEURONTIN) 600 MG tablet TAKE 1 TABLET BY MOUTH TWICE DAILY 60 tablet 3   ipratropium-albuterol (DUONEB) 0.5-2.5 (3) MG/3ML SOLN Take 3 mLs by nebulization every 4 (four) hours as  needed (for shortness of breath/wheezing). 360 mL 6   loratadine (CLARITIN) 10 MG tablet Take 1 tablet (10 mg total) by mouth daily. 30 tablet 11   metoprolol tartrate (LOPRESSOR) 25 MG tablet Take 1 tablet (25 mg total) by mouth 2 (two) times daily. 180 tablet 1   montelukast (SINGULAIR) 10 MG tablet Take 1 tablet (10 mg total) by mouth at bedtime. 90 tablet 1   ondansetron (ZOFRAN) 4 MG tablet Take 1 tablet (4 mg total) by mouth every 6 (six) hours as needed for nausea. 20 tablet 0   oxybutynin (DITROPAN) 5 MG tablet Take 1 tablet (5 mg total) by mouth in the morning and at bedtime. 180 tablet 1   rOPINIRole (REQUIP) 0.5 MG tablet Take two tablets po BID for restless legs. 360 tablet 1   SUMAtriptan (IMITREX) 100 MG tablet Take 1 tablet (100 mg total) by mouth every 2 (two) hours as needed (for migraine headaches.). May repeat in 1 hours if headache persists or recurs. 10 tablet 3   traZODone (DESYREL) 150 MG tablet Take 150-300 mg by mouth at bedtime.      diclofenac Sodium (VOLTAREN) 1 % GEL Apply 4 g topically 4 (four) times daily. (Patient not taking: Reported on 10/18/2020) 350 g 1   nystatin (MYCOSTATIN) 100000 UNIT/ML suspension Take 5 mLs (500,000 Units total) by mouth 4 (four) times daily. (Patient not taking: Reported on 10/18/2020) 60 mL 0   valACYclovir (VALTREX) 500 MG tablet Take 1 tablet (500 mg total) by mouth daily as needed (for fever blisters). (Patient not taking: Reported on 10/18/2020) 90 tablet 1   No current facility-administered medications for this visit.     PHYSICAL EXAMINATION: ECOG PERFORMANCE STATUS: 1 - Symptomatic but completely ambulatory Vitals:   10/18/20 1108  BP: 126/61  Pulse: 68  Resp: 18  Temp: 98 F (36.7 C)   Filed Weights   10/18/20 1108  Weight: 166 lb 11.2 oz (75.6 kg)    Physical Exam Constitutional:      General: She is not in acute distress. HENT:     Head: Normocephalic and atraumatic.  Eyes:     General: No scleral  icterus. Cardiovascular:     Rate and Rhythm: Normal rate and regular rhythm.     Heart sounds: Normal heart sounds.  Pulmonary:  Effort: Pulmonary effort is normal. No respiratory distress.     Breath sounds: No wheezing.  Abdominal:     General: Bowel sounds are normal. There is no distension.     Palpations: Abdomen is soft.  Musculoskeletal:        General: No deformity. Normal range of motion.     Cervical back: Normal range of motion and neck supple.  Skin:    General: Skin is warm and dry.     Findings: No erythema or rash.  Neurological:     Mental Status: She is alert and oriented to person, place, and time. Mental status is at baseline.     Cranial Nerves: No cranial nerve deficit.     Coordination: Coordination normal.  Psychiatric:        Mood and Affect: Mood normal.    LABORATORY DATA:  I have reviewed the data as listed Lab Results  Component Value Date   WBC 6.6 10/11/2020   HGB 10.5 (L) 10/11/2020   HCT 34.0 10/11/2020   MCV 79 10/11/2020   PLT 300 10/11/2020   Recent Labs    11/14/19 1513 11/14/19 1513 11/15/19 0604 11/16/19 0424 02/16/20 0315 08/21/20 1608 09/16/20 1119  NA  --    < > 128* 136 137 139 139  K  --    < > 4.6 4.6 2.9* 3.6 4.2  CL  --    < > 94* 99 95* 101 101  CO2  --    < > '27 29 30 25 20  '$ GLUCOSE  --    < > 147* 126* 162* 125* 109*  BUN  --    < > '11 15 12 11 14  '$ CREATININE 0.67  --  0.55 0.54 1.07* 0.63 0.68  CALCIUM  --    < > 8.6* 8.3* 8.3* 9.6 9.4  GFRNONAA >60  --  >60 >60 58* >60  --   GFRAA >60  --  >60 >60  --   --   --   PROT  --   --   --   --   --   --  6.4  ALBUMIN  --   --   --   --   --   --  4.4  AST  --   --   --   --   --   --  19  ALT  --   --   --   --   --   --  15  ALKPHOS  --   --   --   --   --   --  122*  BILITOT  --   --   --   --   --   --  <0.2   < > = values in this interval not displayed.   Iron/TIBC/Ferritin/ %Sat    Component Value Date/Time   IRON 23 (L) 09/16/2020 1119   IRON 100  05/09/2012 1323   TIBC 454 (H) 09/16/2020 1119   TIBC 393 05/09/2012 1323   FERRITIN 12 (L) 09/16/2020 1119   FERRITIN 67 05/09/2012 1323   IRONPCTSAT 5 (LL) 09/16/2020 1119   IRONPCTSAT 25 05/09/2012 1323      RADIOGRAPHIC STUDIES: I have personally reviewed the radiological images as listed and agreed with the findings in the report. DG Chest 2 View  Result Date: 08/21/2020 CLINICAL DATA:  65 year old female with shortness of breath and chest tightness. EXAM: CHEST - 2 VIEW COMPARISON:  Chest radiograph  dated 09/23/2019. FINDINGS: No focal consolidation, pleural effusion, or pneumothorax. The cardiac silhouette is within limits. Small hiatal hernia. No acute osseous pathology. Multiple surgical clips in the epigastric area. IMPRESSION: No active cardiopulmonary disease. Electronically Signed   By: Anner Crete M.D.   On: 08/21/2020 16:28       ASSESSMENT & PLAN:  1. Iron deficiency anemia, unspecified iron deficiency anemia type    Labs are reviewed and discussed with patient. Consistent with iron deficiency anemia Patient is not able to take oral iron supplementation.  Plan IV iron with Venofer '200mg'$  weekly x 4 doses. Allergy reactions/infusion reaction including anaphylactic reaction discussed with patient. Other side effects include but not limited to high blood pressure, skin rash, weight gain, leg swelling, etc. Patient voices understanding and willing to proceed. Patient has multiple medication allergies. I will give her benadryl as premed.     Orders Placed This Encounter  Procedures   CBC with Differential/Platelet    Standing Status:   Future    Standing Expiration Date:   10/18/2021   Ferritin    Standing Status:   Future    Standing Expiration Date:   10/18/2021   Iron and TIBC    Standing Status:   Future    Standing Expiration Date:   10/18/2021    All questions were answered. The patient knows to call the clinic with any problems questions or  concerns.  cc Jonetta Osgood, NP    Follow up in 3 months, lab - cbc, iron tibc ferritin, MD + venofer Thank you for this kind referral and the opportunity to participate in the care of this patient. A copy of today's note is routed to referring provider    Earlie Server, MD, PhD Hematology Oncology [facility] at [facility] Pager- [phone] 10/19/2020

## 2020-10-20 DIAGNOSIS — I10 Essential (primary) hypertension: Secondary | ICD-10-CM | POA: Diagnosis not present

## 2020-10-20 DIAGNOSIS — J449 Chronic obstructive pulmonary disease, unspecified: Secondary | ICD-10-CM | POA: Diagnosis not present

## 2020-10-21 ENCOUNTER — Inpatient Hospital Stay: Payer: Medicare Other | Attending: Oncology

## 2020-10-21 ENCOUNTER — Encounter: Payer: Self-pay | Admitting: Oncology

## 2020-10-21 VITALS — BP 133/84 | HR 80 | Temp 96.3°F | Resp 20

## 2020-10-21 DIAGNOSIS — D509 Iron deficiency anemia, unspecified: Secondary | ICD-10-CM | POA: Diagnosis not present

## 2020-10-21 DIAGNOSIS — R5383 Other fatigue: Secondary | ICD-10-CM | POA: Insufficient documentation

## 2020-10-21 DIAGNOSIS — K5909 Other constipation: Secondary | ICD-10-CM | POA: Insufficient documentation

## 2020-10-21 MED ORDER — DIPHENHYDRAMINE HCL 50 MG/ML IJ SOLN
25.0000 mg | Freq: Once | INTRAMUSCULAR | Status: AC
  Administered 2020-10-21: 25 mg via INTRAVENOUS
  Filled 2020-10-21: qty 1

## 2020-10-21 MED ORDER — SODIUM CHLORIDE 0.9 % IV SOLN: 200.0000 mg | Freq: Once | INTRAVENOUS | Status: DC

## 2020-10-21 MED ORDER — SODIUM CHLORIDE 0.9 % IV SOLN
Freq: Once | INTRAVENOUS | Status: AC
  Filled 2020-10-21: qty 250

## 2020-10-21 MED ORDER — IRON SUCROSE 20 MG/ML IV SOLN
200.0000 mg | Freq: Once | INTRAVENOUS | Status: AC
  Administered 2020-10-21: 200 mg via INTRAVENOUS
  Filled 2020-10-21: qty 10

## 2020-10-21 NOTE — Patient Instructions (Signed)
[facility] ONCOL[facility]Y  Discharge Instructions: Thank you for choosing [facility] to provide your oncology and hematology care.  If you have a lab appointment with the [facility], please go directly to the [facility] and check in at the registration area.  Wear comfortable clothing and clothing appropriate for easy access to any Portacath or PICC line.   We strive to give you quality time with your provider. You may need to reschedule your appointment if you arrive late (15 or more minutes).  Arriving late affects you and other patients whose appointments are after yours.  Also, if you miss three or more appointments without notifying the office, you may be dismissed from the clinic at the provider's discretion.      For prescription refill requests, have your pharmacy contact our office and allow 72 hours for refills to be completed.    Today you received the following : Venofer   Iron Sucrose injection What is this medication? IRON SUCROSE (AHY ern SOO krohs) is an iron complex. Iron is used to make healthy red blood cells, which carry oxygen and nutrients throughout the body. This medicine is used to treat iron deficiency anemia in people with chronickidney disease. This medicine may be used for other purposes; ask your health care provider orpharmacist if you have questions. COMMON BRAND NAME(S): Venofer What should I tell my care team before I take this medication? They need to know if you have any of these conditions: anemia not caused by low iron levels heart disease high levels of iron in the blood kidney disease liver disease an unusual or allergic reaction to iron, other medicines, foods, dyes, or preservatives pregnant or trying to get pregnant breast-feeding How should I use this medication? This medicine is for infusion into a vein. It is given by a health careprofessional in a hospital or clinic setting. Talk to your  pediatrician regarding the use of this medicine in children. While this drug may be prescribed for children as young as 2 years for selectedconditions, precautions do apply. Overdosage: If you think you have taken too much of this medicine contact apoison control center or emergency room at once. NOTE: This medicine is only for you. Do not share this medicine with others. What if I miss a dose? It is important not to miss your dose. Call your doctor or health careprofessional if you are unable to keep an appointment. What may interact with this medication? Do not take this medicine with any of the following medications: deferoxamine dimercaprol other iron products This medicine may also interact with the following medications: chloramphenicol deferasirox This list may not describe all possible interactions. Give your health care provider a list of all the medicines, herbs, non-prescription drugs, or dietary supplements you use. Also tell them if you smoke, drink alcohol, or use illegaldrugs. Some items may interact with your medicine. What should I watch for while using this medication? Visit your doctor or healthcare professional regularly. Tell your doctor or healthcare professional if your symptoms do not start to get better or if theyget worse. You may need blood work done while you are taking this medicine. You may need to follow a special diet. Talk to your doctor. Foods that contain iron include: whole grains/cereals, dried fruits, beans, or peas, leafy greenvegetables, and organ meats (liver, kidney). What side effects may I notice from receiving this medication? Side effects that you should report to your doctor or health care professionalas soon as  possible: allergic reactions like skin rash, itching or hives, swelling of the face, lips, or tongue breathing problems changes in blood pressure cough fast, irregular heartbeat feeling faint or lightheaded, falls fever or  chills flushing, sweating, or hot feelings joint or muscle aches/pains seizures swelling of the ankles or feet unusually weak or tired Side effects that usually do not require medical attention (report to yourdoctor or health care professional if they continue or are bothersome): diarrhea feeling achy headache irritation at site where injected nausea, vomiting stomach upset tiredness This list may not describe all possible side effects. Call your doctor for medical advice about side effects. You may report side effects to FDA at1-800-FDA-1088. Where should I keep my medication? This drug is given in a hospital or clinic and will not be stored at home. NOTE: This sheet is a summary. It may not cover all possible information. If you have questions about this medicine, talk to your doctor, pharmacist, orhealth care provider.  2022 Elsevier/Gold Standard (2010-12-18 17:14:35)     To help prevent nausea and vomiting after your treatment, we encourage you to take your nausea medication as directed.  BELOW ARE SYMPTOMS THAT SHOULD BE REPORTED IMMEDIATELY: *FEVER GREATER THAN 100.4 F (38 C) OR HIGHER *CHILLS OR SWEATING *NAUSEA AND VOMITING THAT IS NOT CONTROLLED WITH YOUR NAUSEA MEDICATION *UNUSUAL SHORTNESS OF BREATH *UNUSUAL BRUISING OR BLEEDING *URINARY PROBLEMS (pain or burning when urinating, or frequent urination) *BOWEL PROBLEMS (unusual diarrhea, constipation, pain near the anus) TENDERNESS IN MOUTH AND THROAT WITH OR WITHOUT PRESENCE OF ULCERS (sore throat, sores in mouth, or a toothache) UNUSUAL RASH, SWELLING OR PAIN  UNUSUAL VAGINAL DISCHARGE OR ITCHING   Items with * indicate a potential emergency and should be followed up as soon as possible or go to the Emergency Department if any problems should occur.  Please show the CHEMOTHERAPY ALERT CARD or IMMUNOTHERAPY ALERT CARD at check-in to the Emergency Department and triage nurse.  Should you have questions after your  visit or need to cancel or reschedule your appointment, please contact [facility]  [phone] and follow the prompts.  Office hours are 8:00 a.m. to 4:30 p.m. Monday - Friday. Please note that voicemails left after 4:00 p.m. may not be returned until the following business day.  We are closed weekends and major holidays. You have access to a nurse at all times for urgent questions. Please call the main number to the clinic [phone] and follow the prompts.  For any non-urgent questions, you may also contact your provider using MyChart. We now offer e-Visits for anyone 61 and older to request care online for non-urgent symptoms. For details visit [email or web address].   Also download the MyChart app! Go to the app store, search "MyChart", open the app, select [facility], and log in with your MyChart username and password.  Due to Covid, a mask is required upon entering the hospital/clinic. If you do not have a mask, one will be given to you upon arrival. For doctor visits, patients may have 1 support person aged 60 or older with them. For treatment visits, patients cannot have anyone with them due to current Covid guidelines and our immunocompromised population.

## 2020-10-28 ENCOUNTER — Inpatient Hospital Stay: Payer: Medicare Other

## 2020-10-28 ENCOUNTER — Other Ambulatory Visit: Payer: Self-pay

## 2020-10-28 VITALS — BP 131/64 | HR 66 | Temp 97.9°F | Resp 16

## 2020-10-28 DIAGNOSIS — D509 Iron deficiency anemia, unspecified: Secondary | ICD-10-CM

## 2020-10-28 DIAGNOSIS — K5909 Other constipation: Secondary | ICD-10-CM | POA: Diagnosis not present

## 2020-10-28 DIAGNOSIS — R5383 Other fatigue: Secondary | ICD-10-CM | POA: Diagnosis not present

## 2020-10-28 MED ORDER — DIPHENHYDRAMINE HCL 50 MG/ML IJ SOLN
25.0000 mg | Freq: Once | INTRAMUSCULAR | Status: AC
  Administered 2020-10-28: 25 mg via INTRAVENOUS
  Filled 2020-10-28: qty 1

## 2020-10-28 MED ORDER — SODIUM CHLORIDE 0.9 % IV SOLN
Freq: Once | INTRAVENOUS | Status: AC
Start: 2020-10-28 — End: 2020-10-28
  Filled 2020-10-28: qty 250

## 2020-10-28 MED ORDER — IRON SUCROSE 20 MG/ML IV SOLN
200.0000 mg | Freq: Once | INTRAVENOUS | Status: AC
  Administered 2020-10-28: 200 mg via INTRAVENOUS
  Filled 2020-10-28: qty 10

## 2020-10-28 MED ORDER — SODIUM CHLORIDE 0.9 % IV SOLN: 200.0000 mg | Freq: Once | INTRAVENOUS | Status: DC

## 2020-10-29 ENCOUNTER — Ambulatory Visit: Payer: Self-pay | Admitting: Nurse Practitioner

## 2020-11-04 ENCOUNTER — Encounter
Admission: RE | Admit: 2020-11-04 | Discharge: 2020-11-04 | Disposition: A | Discharge status: Home / Self Care | Payer: Medicare Other | Source: Ambulatory Visit | Attending: Orthopedic Surgery | Admitting: Orthopedic Surgery

## 2020-11-04 ENCOUNTER — Other Ambulatory Visit: Payer: Self-pay

## 2020-11-04 HISTORY — DX: Anemia, unspecified: D64.9

## 2020-11-04 HISTORY — DX: Prediabetes: R73.03

## 2020-11-04 NOTE — Patient Instructions (Signed)
Your procedure is scheduled on: 11/14/20   Report to [facility]. To find out your arrival time please call [phone] between 1PM - 3PM on 11/13/20.  Remember: Instructions that are not followed completely may result in serious medical risk, up to and including death, or upon the discretion of your surgeon and anesthesiologist your surgery may need to be rescheduled.     _X__ 1. Do not eat food after midnight the night before your procedure.                 No gum chewing or hard candies. You may drink clear liquids up to 2 hours                 before you are scheduled to arrive for your surgery- DO not drink clear                 liquids within 2 hours of the start of your surgery.                 Clear Liquids include:  water, apple juice without pulp, clear carbohydrate                 drink such as Clearfast or Gatorade, Black Coffee or Tea (Do not add                 anything to coffee or tea). Diabetics water only  FINISH THE ENSURE "CLEAR" PRE SURGERY DRINK 2 HOURS BEFORE ARRIVING  __X__2.  On the morning of surgery brush your teeth with toothpaste and water, you                 may rinse your mouth with mouthwash if you wish.  Do not swallow any              toothpaste of mouthwash.     _X__ 3.  No Alcohol for 24 hours before or after surgery.   _X__ 4.  Do Not Smoke or use e-cigarettes For 24 Hours Prior to Your Surgery.                 Do not use any chewable tobacco products for at least 6 hours prior to                 surgery.  ____  5.  Bring all medications with you on the day of surgery if instructed.   __X__  6.  Notify your doctor if there is any change in your medical condition      (cold, fever, infections).     Do not wear jewelry, make-up, hairpins, clips or nail polish. Do not wear lotions, powders, or perfumes.  Do not shave BODY HAIR 48 hours prior to surgery. Men may shave face and neck. Do not bring  valuables to the hospital.    [facility] is not responsible for any belongings or valuables.  Contacts, dentures/partials or body piercings may not be worn into surgery. Bring a case for your contacts, glasses or hearing aids, a denture cup will be supplied. Leave your suitcase in the car. After surgery it may be brought to your room. For patients admitted to the hospital, discharge time is determined by your treatment team.   Patients discharged the day of surgery will not be allowed to drive home.   Please read over the following fact sheets that you were given:   MRSA Information,  CHG SOAP  __X__ Take these medicines the morning of surgery with A SIP OF WATER:    1. Brexpiprazole/REXULTI 3 MG TABS  2. busPIRone (BUSPAR) 10 MG tablet  3. diltiazem (DILACOR XR) 180 MG 24 hr capsule  4. gabapentin (NEURONTIN) 600 MG tablet  5. loratadine (CLARITIN) 10 MG tablet  6. metoprolol tartrate (LOPRESSOR) 25 MG tablet  7. oxybutynin (DITROPAN) 5 MG tablet  8. pantoprazole (PROTONIX) 40 MG tablet  9. rOPINIRole (REQUIP) 0.5 MG tablet  ____ Fleet Enema (as directed)   __X__ Use CHG Soap/SAGE wipes as directed  __X__ Use inhalers on the day of surgery USE YOUR NEBULIZER AND THE BREO ELLIPTA BEFORE ARRIVING TO SURGERY  ____ Stop metformin/Janumet/Farxiga 2 days prior to surgery    ____ Take 1/2 of usual insulin dose the night before surgery. No insulin the morning          of surgery.   ____ Stop Blood Thinners Coumadin/Plavix/Xarelto/Pleta/Pradaxa/Eliquis/Effient/Aspirin  on   Or contact your Surgeon, Cardiologist or Medical Doctor regarding  ability to stop your blood thinners  __X__ Stop Anti-inflammatories 7 days before surgery such as Advil, Ibuprofen, Motrin,  BC or Goodies Powder, Naprosyn, Naproxen, Aleve, Aspirin    __X__ Stop all herbal supplements, fish oil or vitamin E until after surgery.    ____ Bring C-Pap to the hospital.

## 2020-11-05 ENCOUNTER — Inpatient Hospital Stay: Payer: Medicare Other

## 2020-11-05 ENCOUNTER — Other Ambulatory Visit: Payer: Self-pay

## 2020-11-05 VITALS — BP 126/72 | HR 93 | Temp 98.0°F

## 2020-11-05 DIAGNOSIS — R82998 Other abnormal findings in urine: Secondary | ICD-10-CM

## 2020-11-05 DIAGNOSIS — D509 Iron deficiency anemia, unspecified: Secondary | ICD-10-CM

## 2020-11-05 DIAGNOSIS — R5383 Other fatigue: Secondary | ICD-10-CM | POA: Diagnosis not present

## 2020-11-05 DIAGNOSIS — K5909 Other constipation: Secondary | ICD-10-CM | POA: Diagnosis not present

## 2020-11-05 LAB — URINALYSIS, COMPLETE (UACMP) WITH MICROSCOPIC
Bacteria, UA: NONE SEEN
Bilirubin Urine: NEGATIVE
Glucose, UA: NEGATIVE mg/dL
Hgb urine dipstick: NEGATIVE
Ketones, ur: NEGATIVE mg/dL
Leukocytes,Ua: NEGATIVE
Nitrite: NEGATIVE
Protein, ur: NEGATIVE mg/dL
Specific Gravity, Urine: 1.008 (ref 1.005–1.030)
pH: 6 (ref 5.0–8.0)

## 2020-11-05 MED ORDER — SODIUM CHLORIDE 0.9 % IV SOLN: 200.0000 mg | Freq: Once | INTRAVENOUS | Status: DC

## 2020-11-05 MED ORDER — IRON SUCROSE 20 MG/ML IV SOLN: 200.0000 mg | Freq: Once | INTRAVENOUS | Status: DC

## 2020-11-05 MED ORDER — DIPHENHYDRAMINE HCL 50 MG/ML IJ SOLN: 25.0000 mg | Freq: Once | INTRAMUSCULAR | Status: DC

## 2020-11-05 MED ORDER — SODIUM CHLORIDE 0.9 % IV SOLN
Freq: Once | INTRAVENOUS | Status: DC
  Filled 2020-11-05: qty 250

## 2020-11-05 NOTE — Progress Notes (Signed)
Patient reports urine being dark brown x 2 episodes after last Venofer infusion. Denies any other s/s. Dr. Tasia Catchings made aware. Per Darci Current., RN, okay to proceed with Venofer.   Unable to obtain IV site. Patient encouraged to drink fluids and will be rescheduled for Venofer treatment. Dr. Tasia Catchings and team updated.

## 2020-11-06 ENCOUNTER — Telehealth: Payer: Self-pay | Admitting: Pharmacist

## 2020-11-06 NOTE — Progress Notes (Addendum)
Chronic Care Management Pharmacy Assistant   Name: Summer Murphy  MRN: [id number] DOB: 1955-04-16  Reason for Encounter: General Disease State Call   Conditions to be addressed/monitored: HTN, COPD, Diabetes, RLS, Depression, Chronic Pain, Urge Incontinence  Recent office visits:  10/15/20 Jonetta Osgood, NP. For follow-up. STOPPED Omeprazole and Pantoprazole.  10/08/20 McDonough, Si Gaul, PA-C. For follow-up. No medication changes.  09/16/20 Dr. Humphrey Rolls For general adult medical examination. STARTED Fluticasone Propionate 50 MCG/ACT 2 sprays each nare daily STOPPED Buspirone, Lorazepam, Prednisone, Tiotropium.  08/21/20 Jonetta Osgood, NP. For follow-up. No medication changes.  08/20/20 Jonetta Osgood, NP. For follow-up. STARTED Amoxicillin-Pot-Clavulanate 875-125 mg 1 tablet 2 times daily.   Recent consult visits:  10/18/20 Oncology Earlie Server, MD. For new patent initial visit. No medication changes.  10/07/20 Orthopedic surgery Lauris Poag, MD. For post op visit. No medication changes.   Hospital visits:  10/02/20 [facility] Sax For surgery on Esophagoscopy.  08/21/20 [facility] Emergency Department (1 Hour) Vladimir Crofts, MD. For shortness of breath. STARTED Prednisone 50 mg daily.  Medications: Outpatient Encounter Medications as of 11/06/2020  Medication Sig   Biotin 800 MCG TABS Take 1 tablet (800 mcg total) by mouth daily. (Patient not taking: No sig reported)   Brexpiprazole 4 MG TABS Take 4 mg by mouth daily.   busPIRone (BUSPAR) 10 MG tablet Take 10 mg by mouth 2 (two) times daily.   clonazePAM (KLONOPIN) 0.5 MG tablet Take 0.5 mg by mouth at bedtime.   clotrimazole-betamethasone (LOTRISONE) cream Apply 1 application topically 2 (two) times daily. (Patient not taking: No sig reported)   COMBIVENT RESPIMAT 20-100 MCG/ACT AERS respimat INHALE 1 PUFF INTO THE LUNGS EVERY 6 HOURS (Patient taking differently: Inhale 1 puff into the  lungs every 6 (six) hours as needed (COPD).)   diclofenac Sodium (VOLTAREN) 1 % GEL Apply 4 g topically 4 (four) times daily. (Patient taking differently: Apply 4 g topically 2 (two) times daily.)   diltiazem (DILACOR XR) 180 MG 24 hr capsule Take 1 capsule (180 mg total) by mouth daily.   diphenhydrAMINE (BENADRYL) 25 MG tablet Take 50 mg by mouth 2 (two) times daily as needed for allergies.   diphenoxylate-atropine (LOMOTIL) 2.5-0.025 MG tablet Take 1 tablet by mouth 3 (three) times daily as needed for diarrhea or loose stools.   docusate sodium (COLACE) 100 MG capsule Take 1 capsule (100 mg total) by mouth 2 (two) times daily. (Patient not taking: No sig reported)   DULoxetine (CYMBALTA) 60 MG capsule Take 60 mg by mouth at bedtime.   fluticasone (FLONASE) 50 MCG/ACT nasal spray Place 2 sprays into both nostrils daily.   fluticasone furoate-vilanterol (BREO ELLIPTA) 200-25 MCG/INH AEPB Inhale 1 puff into the lungs daily.   gabapentin (NEURONTIN) 600 MG tablet TAKE 1 TABLET BY MOUTH TWICE DAILY   ipratropium-albuterol (DUONEB) 0.5-2.5 (3) MG/3ML SOLN Take 3 mLs by nebulization every 4 (four) hours as needed (for shortness of breath/wheezing).   loratadine (CLARITIN) 10 MG tablet Take 1 tablet (10 mg total) by mouth daily.   metoprolol tartrate (LOPRESSOR) 25 MG tablet Take 1 tablet (25 mg total) by mouth 2 (two) times daily.   montelukast (SINGULAIR) 10 MG tablet Take 1 tablet (10 mg total) by mouth at bedtime.   nystatin (MYCOSTATIN) 100000 UNIT/ML suspension Take 5 mLs (500,000 Units total) by mouth 4 (four) times daily. (Patient not taking: No sig reported)   ondansetron (ZOFRAN) 4 MG tablet Take 1 tablet (4  mg total) by mouth every 6 (six) hours as needed for nausea.   oxybutynin (DITROPAN) 5 MG tablet Take 1 tablet (5 mg total) by mouth in the morning and at bedtime.   pantoprazole (PROTONIX) 40 MG tablet Take 40 mg by mouth 2 (two) times daily.   rOPINIRole (REQUIP) 0.5 MG tablet Take two  tablets po BID for restless legs.   SUMAtriptan (IMITREX) 100 MG tablet Take 1 tablet (100 mg total) by mouth every 2 (two) hours as needed (for migraine headaches.). May repeat in 1 hours if headache persists or recurs.   traZODone (DESYREL) 150 MG tablet Take 150 mg by mouth at bedtime.   valACYclovir (VALTREX) 500 MG tablet Take 1 tablet (500 mg total) by mouth daily as needed (for fever blisters).   No facility-administered encounter medications on file as of 11/06/2020.   GEN CALL: Patient stated she is doing well overall. She stated she is still pretty active and eats a well rounded diet. She stated she does not have any questions about her medications at this time. She stated she is having surgery on 11/14/20. I informed her that around that time if she needs any assistance on her medications if anything changes please don't hesitate to call. She voiced understanding.   Star Rating Drugs: N/A.  Follow-Up:Pharmacist Review  Charlann Lange, RMA Clinical Pharmacist Assistant [phone]  10 minutes spent in review, coordination, and documentation.  Reviewed by: Beverly Milch, PharmD Clinical Pharmacist [phone]

## 2020-11-07 ENCOUNTER — Inpatient Hospital Stay: Payer: Medicare Other

## 2020-11-07 ENCOUNTER — Other Ambulatory Visit: Payer: Self-pay

## 2020-11-07 DIAGNOSIS — D509 Iron deficiency anemia, unspecified: Secondary | ICD-10-CM

## 2020-11-07 DIAGNOSIS — K5909 Other constipation: Secondary | ICD-10-CM | POA: Diagnosis not present

## 2020-11-07 DIAGNOSIS — R5383 Other fatigue: Secondary | ICD-10-CM | POA: Diagnosis not present

## 2020-11-07 LAB — URINE CULTURE

## 2020-11-07 MED ORDER — SODIUM CHLORIDE 0.9 % IV SOLN: 200.0000 mg | Freq: Once | INTRAVENOUS | Status: DC

## 2020-11-07 MED ORDER — SODIUM CHLORIDE 0.9 % IV SOLN
Freq: Once | INTRAVENOUS | Status: AC
  Filled 2020-11-07: qty 250

## 2020-11-07 MED ORDER — IRON SUCROSE 20 MG/ML IV SOLN
200.0000 mg | Freq: Once | INTRAVENOUS | Status: AC
  Administered 2020-11-07: 200 mg via INTRAVENOUS
  Filled 2020-11-07: qty 10

## 2020-11-07 MED ORDER — DIPHENHYDRAMINE HCL 50 MG/ML IJ SOLN
25.0000 mg | Freq: Once | INTRAMUSCULAR | Status: AC
  Administered 2020-11-07: 25 mg via INTRAVENOUS
  Filled 2020-11-07: qty 1

## 2020-11-07 NOTE — Patient Instructions (Signed)
[facility]  Discharge Instructions: Thank you for choosing [facility] to provide your oncology and hematology care.  If you have a lab appointment with the Cancer Center, please go directly to the Cancer Center and check in at the registration area.  Wear comfortable clothing and clothing appropriate for easy access to any Portacath or PICC line.   We strive to give you quality time with your provider. You may need to reschedule your appointment if you arrive late (15 or more minutes).  Arriving late affects you and other patients whose appointments are after yours.  Also, if you miss three or more appointments without notifying the office, you may be dismissed from the clinic at the provider's discretion.      For prescription refill requests, have your pharmacy contact our office and allow 72 hours for refills to be completed.    Today you received the following : Venofer   To help prevent nausea and vomiting after your treatment, we encourage you to take your nausea medication as directed.  BELOW ARE SYMPTOMS THAT SHOULD BE REPORTED IMMEDIATELY: . *FEVER GREATER THAN 100.4 F (38 C) OR HIGHER . *CHILLS OR SWEATING . *NAUSEA AND VOMITING THAT IS NOT CONTROLLED WITH YOUR NAUSEA MEDICATION . *UNUSUAL SHORTNESS OF BREATH . *UNUSUAL BRUISING OR BLEEDING . *URINARY PROBLEMS (pain or burning when urinating, or frequent urination) . *BOWEL PROBLEMS (unusual diarrhea, constipation, pain near the anus) . TENDERNESS IN MOUTH AND THROAT WITH OR WITHOUT PRESENCE OF ULCERS (sore throat, sores in mouth, or a toothache) . UNUSUAL RASH, SWELLING OR PAIN  . UNUSUAL VAGINAL DISCHARGE OR ITCHING   Items with * indicate a potential emergency and should be followed up as soon as possible or go to the Emergency Department if any problems should occur.  Please show the CHEMOTHERAPY ALERT CARD or IMMUNOTHERAPY ALERT CARD at check-in to the Emergency  Department and triage nurse.  Should you have questions after your visit or need to cancel or reschedule your appointment, please contact [facility]  [phone] and follow the prompts.  Office hours are 8:00 a.m. to 4:30 p.m. Monday - Friday. Please note that voicemails left after 4:00 p.m. may not be returned until the following business day.  We are closed weekends and major holidays. You have access to a nurse at all times for urgent questions. Please call the main number to the clinic [phone] and follow the prompts.  For any non-urgent questions, you may also contact your provider using MyChart. We now offer e-Visits for anyone 18 and older to request care online for non-urgent symptoms. For details visit [email or web address].   Also download the MyChart app! Go to the app store, search "MyChart", open the app, select [facility], and log in with your MyChart username and password.  Due to Covid, a mask is required upon entering the hospital/clinic. If you do not have a mask, one will be given to you upon arrival. For doctor visits, patients may have 1 support person aged 18 or older with them. For treatment visits, patients cannot have anyone with them due to current Covid guidelines and our immunocompromised population.  

## 2020-11-11 ENCOUNTER — Inpatient Hospital Stay: Payer: Medicare Other

## 2020-11-11 VITALS — BP 122/65 | HR 88 | Temp 98.1°F | Resp 18

## 2020-11-11 DIAGNOSIS — K5909 Other constipation: Secondary | ICD-10-CM | POA: Diagnosis not present

## 2020-11-11 DIAGNOSIS — D509 Iron deficiency anemia, unspecified: Secondary | ICD-10-CM | POA: Diagnosis not present

## 2020-11-11 DIAGNOSIS — R5383 Other fatigue: Secondary | ICD-10-CM | POA: Diagnosis not present

## 2020-11-11 MED ORDER — DIPHENHYDRAMINE HCL 50 MG/ML IJ SOLN
25.0000 mg | Freq: Once | INTRAMUSCULAR | Status: AC
  Administered 2020-11-11: 25 mg via INTRAVENOUS

## 2020-11-11 MED ORDER — SODIUM CHLORIDE 0.9 % IV SOLN: 200.0000 mg | Freq: Once | INTRAVENOUS | Status: DC

## 2020-11-11 MED ORDER — IRON SUCROSE 20 MG/ML IV SOLN
200.0000 mg | Freq: Once | INTRAVENOUS | Status: AC
  Administered 2020-11-11: 200 mg via INTRAVENOUS

## 2020-11-11 MED ORDER — SODIUM CHLORIDE 0.9 % IV SOLN
Freq: Once | INTRAVENOUS | Status: AC
  Filled 2020-11-11: qty 250

## 2020-11-12 ENCOUNTER — Ambulatory Visit (INDEPENDENT_AMBULATORY_CARE_PROVIDER_SITE_OTHER): Payer: Medicare Other

## 2020-11-12 ENCOUNTER — Other Ambulatory Visit: Admission: RE | Admit: 2020-11-12 | Payer: Medicare Other | Source: Ambulatory Visit

## 2020-11-12 ENCOUNTER — Other Ambulatory Visit: Payer: Self-pay

## 2020-11-12 ENCOUNTER — Other Ambulatory Visit
Admission: RE | Admit: 2020-11-12 | Discharge: 2020-11-12 | Disposition: A | Discharge status: Home / Self Care | Payer: Medicare Other | Source: Ambulatory Visit | Attending: Orthopedic Surgery | Admitting: Orthopedic Surgery

## 2020-11-12 DIAGNOSIS — Z01812 Encounter for preprocedural laboratory examination: Secondary | ICD-10-CM | POA: Insufficient documentation

## 2020-11-12 DIAGNOSIS — E538 Deficiency of other specified B group vitamins: Secondary | ICD-10-CM | POA: Diagnosis not present

## 2020-11-12 DIAGNOSIS — M6281 Muscle weakness (generalized): Secondary | ICD-10-CM | POA: Diagnosis not present

## 2020-11-12 DIAGNOSIS — Z20822 Contact with and (suspected) exposure to covid-19: Secondary | ICD-10-CM | POA: Diagnosis not present

## 2020-11-12 DIAGNOSIS — M978XXD Periprosthetic fracture around other internal prosthetic joint, subsequent encounter: Secondary | ICD-10-CM | POA: Diagnosis not present

## 2020-11-12 DIAGNOSIS — J449 Chronic obstructive pulmonary disease, unspecified: Secondary | ICD-10-CM | POA: Diagnosis not present

## 2020-11-12 LAB — SARS CORONAVIRUS 2 (TAT 6-24 HRS): SARS Coronavirus 2: NEGATIVE

## 2020-11-13 ENCOUNTER — Telehealth: Payer: Self-pay

## 2020-11-13 NOTE — Telephone Encounter (Signed)
Clearance faxed back to [facility] orthopedics [phone]. Sent to be scanned-Toni

## 2020-11-14 ENCOUNTER — Encounter
Admission: RE | Disposition: A | Discharge status: Home / Self Care | Payer: Self-pay | Source: Ambulatory Visit | Attending: Orthopedic Surgery

## 2020-11-14 ENCOUNTER — Observation Stay
Admission: RE | Admit: 2020-11-14 | Discharge: 2020-11-15 | Disposition: A | Discharge status: Home / Self Care | Payer: Medicare Other | Source: Ambulatory Visit | Attending: Orthopedic Surgery | Admitting: Orthopedic Surgery

## 2020-11-14 ENCOUNTER — Encounter: Payer: Self-pay | Admitting: Orthopedic Surgery

## 2020-11-14 ENCOUNTER — Ambulatory Visit: Payer: Medicare Other | Admitting: Urgent Care

## 2020-11-14 ENCOUNTER — Other Ambulatory Visit: Payer: Self-pay

## 2020-11-14 ENCOUNTER — Ambulatory Visit: Payer: Medicare Other

## 2020-11-14 DIAGNOSIS — Z96651 Presence of right artificial knee joint: Secondary | ICD-10-CM | POA: Diagnosis not present

## 2020-11-14 DIAGNOSIS — Z9889 Other specified postprocedural states: Secondary | ICD-10-CM

## 2020-11-14 DIAGNOSIS — Z79899 Other long term (current) drug therapy: Secondary | ICD-10-CM | POA: Diagnosis not present

## 2020-11-14 DIAGNOSIS — T8484XA Pain due to internal orthopedic prosthetic devices, implants and grafts, initial encounter: Principal | ICD-10-CM | POA: Insufficient documentation

## 2020-11-14 DIAGNOSIS — J449 Chronic obstructive pulmonary disease, unspecified: Secondary | ICD-10-CM | POA: Insufficient documentation

## 2020-11-14 DIAGNOSIS — Y831 Surgical operation with implant of artificial internal device as the cause of abnormal reaction of the patient, or of later complication, without mention of misadventure at the time of the procedure: Secondary | ICD-10-CM | POA: Insufficient documentation

## 2020-11-14 DIAGNOSIS — J45909 Unspecified asthma, uncomplicated: Secondary | ICD-10-CM | POA: Insufficient documentation

## 2020-11-14 DIAGNOSIS — E119 Type 2 diabetes mellitus without complications: Secondary | ICD-10-CM | POA: Diagnosis not present

## 2020-11-14 DIAGNOSIS — Z419 Encounter for procedure for purposes other than remedying health state, unspecified: Secondary | ICD-10-CM

## 2020-11-14 DIAGNOSIS — M978XXA Periprosthetic fracture around other internal prosthetic joint, initial encounter: Secondary | ICD-10-CM | POA: Diagnosis not present

## 2020-11-14 DIAGNOSIS — E785 Hyperlipidemia, unspecified: Secondary | ICD-10-CM | POA: Diagnosis not present

## 2020-11-14 DIAGNOSIS — Z96652 Presence of left artificial knee joint: Secondary | ICD-10-CM | POA: Diagnosis not present

## 2020-11-14 DIAGNOSIS — I10 Essential (primary) hypertension: Secondary | ICD-10-CM | POA: Insufficient documentation

## 2020-11-14 HISTORY — PX: HARDWARE REMOVAL: SHX979

## 2020-11-14 LAB — CREATININE, SERUM
Creatinine, Ser: 0.73 mg/dL (ref 0.44–1.00)
GFR, Estimated: >60 mL/min (ref >60)

## 2020-11-14 LAB — CBC
HCT: 35.8 % — ABNORMAL LOW (ref 36.0–46.0)
Hemoglobin: 11.4 g/dL — ABNORMAL LOW (ref 12.0–15.0)
MCH: 27.4 pg (ref 26.0–34.0)
MCHC: 31.8 g/dL (ref 30.0–36.0)
MCV: 86.1 fL (ref 80.0–100.0)
Platelets: 211 10*3/uL (ref 150–400)
RBC: 4.16 MIL/uL (ref 3.87–5.11)
RDW: 19.5 % — ABNORMAL HIGH (ref 11.5–15.5)
WBC: 14.4 10*3/uL — ABNORMAL HIGH (ref 4.0–10.5)
nRBC: 0 % (ref 0.0–0.2)

## 2020-11-14 SURGERY — REMOVAL, HARDWARE
Anesthesia: General | Site: Leg Upper | Laterality: Left

## 2020-11-14 MED ORDER — DIPHENHYDRAMINE HCL 25 MG PO CAPS: 50.0000 mg | ORAL_CAPSULE | Freq: Two times a day (BID) | ORAL | Status: DC | PRN

## 2020-11-14 MED ORDER — NEOMYCIN-POLYMYXIN B GU 40-200000 IR SOLN
Status: AC
  Filled 2020-11-14: qty 2

## 2020-11-14 MED ORDER — MIDAZOLAM HCL 2 MG/2ML IJ SOLN
INTRAMUSCULAR | Status: AC
  Filled 2020-11-14: qty 2

## 2020-11-14 MED ORDER — SUMATRIPTAN SUCCINATE 50 MG PO TABS
100.0000 mg | ORAL_TABLET | Freq: Every day | ORAL | Status: DC | PRN
  Filled 2020-11-14: qty 2

## 2020-11-14 MED ORDER — ORAL CARE MOUTH RINSE: 15.0000 mL | Freq: Once | OROMUCOSAL | Status: AC

## 2020-11-14 MED ORDER — PANTOPRAZOLE SODIUM 40 MG PO TBEC
40.0000 mg | DELAYED_RELEASE_TABLET | Freq: Two times a day (BID) | ORAL | Status: DC
  Administered 2020-11-14 – 2020-11-15 (×2): 40 mg via ORAL
  Filled 2020-11-14 (×2): qty 1

## 2020-11-14 MED ORDER — ONDANSETRON HCL 4 MG/2ML IJ SOLN: 4.0000 mg | Freq: Once | INTRAMUSCULAR | Status: DC | PRN

## 2020-11-14 MED ORDER — POLYETHYLENE GLYCOL 3350 17 G PO PACK: 17.0000 g | PACK | Freq: Every day | ORAL | Status: DC | PRN

## 2020-11-14 MED ORDER — EPHEDRINE SULFATE 50 MG/ML IJ SOLN
INTRAMUSCULAR | Status: DC | PRN
  Administered 2020-11-14 (×2): 10 mg via INTRAVENOUS
  Administered 2020-11-14: 5 mg via INTRAVENOUS

## 2020-11-14 MED ORDER — DIPHENOXYLATE-ATROPINE 2.5-0.025 MG PO TABS
1.0000 | ORAL_TABLET | Freq: Three times a day (TID) | ORAL | Status: DC | PRN
  Filled 2020-11-14: qty 1

## 2020-11-14 MED ORDER — DEXAMETHASONE SODIUM PHOSPHATE 10 MG/ML IJ SOLN
INTRAMUSCULAR | Status: DC | PRN
  Administered 2020-11-14: 10 mg via INTRAVENOUS

## 2020-11-14 MED ORDER — CEFAZOLIN SODIUM-DEXTROSE 2-4 GM/100ML-% IV SOLN
INTRAVENOUS | Status: AC
  Filled 2020-11-14: qty 100

## 2020-11-14 MED ORDER — DOCUSATE SODIUM 100 MG PO CAPS
100.0000 mg | ORAL_CAPSULE | Freq: Two times a day (BID) | ORAL | Status: DC
  Administered 2020-11-14 – 2020-11-15 (×2): 100 mg via ORAL
  Filled 2020-11-14 (×2): qty 1

## 2020-11-14 MED ORDER — DULOXETINE HCL 60 MG PO CPEP
60.0000 mg | ORAL_CAPSULE | Freq: Every day | ORAL | Status: DC
  Administered 2020-11-14: 60 mg via ORAL
  Filled 2020-11-14 (×2): qty 1

## 2020-11-14 MED ORDER — TRAZODONE HCL 50 MG PO TABS
150.0000 mg | ORAL_TABLET | Freq: Every day | ORAL | Status: DC
  Administered 2020-11-14: 150 mg via ORAL
  Filled 2020-11-14: qty 1

## 2020-11-14 MED ORDER — MEPERIDINE HCL 25 MG/ML IJ SOLN: 6.2500 mg | INTRAMUSCULAR | Status: DC | PRN

## 2020-11-14 MED ORDER — ACETAMINOPHEN 325 MG PO TABS: 325.0000 mg | ORAL_TABLET | Freq: Four times a day (QID) | ORAL | Status: DC | PRN

## 2020-11-14 MED ORDER — FLUTICASONE PROPIONATE 50 MCG/ACT NA SUSP
2.0000 | Freq: Every day | NASAL | Status: DC
  Administered 2020-11-15: 2 via NASAL
  Filled 2020-11-14: qty 16

## 2020-11-14 MED ORDER — HYDROMORPHONE HCL 1 MG/ML IJ SOLN: 0.5000 mg | INTRAMUSCULAR | Status: DC | PRN

## 2020-11-14 MED ORDER — MONTELUKAST SODIUM 10 MG PO TABS
10.0000 mg | ORAL_TABLET | Freq: Every day | ORAL | Status: DC
  Administered 2020-11-14: 10 mg via ORAL
  Filled 2020-11-14: qty 1

## 2020-11-14 MED ORDER — ZOLPIDEM TARTRATE 5 MG PO TABS: 5.0000 mg | ORAL_TABLET | Freq: Every evening | ORAL | Status: DC | PRN

## 2020-11-14 MED ORDER — OXYBUTYNIN CHLORIDE 5 MG PO TABS
5.0000 mg | ORAL_TABLET | Freq: Two times a day (BID) | ORAL | Status: DC
  Administered 2020-11-14 – 2020-11-15 (×2): 5 mg via ORAL
  Filled 2020-11-14 (×3): qty 1

## 2020-11-14 MED ORDER — ONDANSETRON HCL 4 MG/2ML IJ SOLN
INTRAMUSCULAR | Status: DC | PRN
  Administered 2020-11-14: 4 mg via INTRAVENOUS

## 2020-11-14 MED ORDER — BREXPIPRAZOLE 1 MG PO TABS
4.0000 mg | ORAL_TABLET | Freq: Every day | ORAL | Status: DC
  Administered 2020-11-15: 4 mg via ORAL
  Filled 2020-11-14: qty 4

## 2020-11-14 MED ORDER — FENTANYL CITRATE (PF) 100 MCG/2ML IJ SOLN
INTRAMUSCULAR | Status: DC | PRN
  Administered 2020-11-14 (×2): 50 ug via INTRAVENOUS
  Administered 2020-11-14 (×2): 25 ug via INTRAVENOUS

## 2020-11-14 MED ORDER — ROPINIROLE HCL 1 MG PO TABS
0.5000 mg | ORAL_TABLET | Freq: Two times a day (BID) | ORAL | Status: DC | PRN
Start: 2020-11-14 — End: 2020-11-15

## 2020-11-14 MED ORDER — CHLORHEXIDINE GLUCONATE 0.12 % MT SOLN
OROMUCOSAL | Status: AC
  Administered 2020-11-14: 15 mL via OROMUCOSAL
  Filled 2020-11-14: qty 15

## 2020-11-14 MED ORDER — ONDANSETRON HCL 4 MG PO TABS: 4.0000 mg | ORAL_TABLET | Freq: Four times a day (QID) | ORAL | Status: DC | PRN

## 2020-11-14 MED ORDER — FENTANYL CITRATE (PF) 100 MCG/2ML IJ SOLN
25.0000 ug | INTRAMUSCULAR | Status: DC | PRN
  Administered 2020-11-14 (×2): 25 ug via INTRAVENOUS

## 2020-11-14 MED ORDER — BUPIVACAINE-EPINEPHRINE (PF) 0.25% -1:200000 IJ SOLN
INTRAMUSCULAR | Status: DC | PRN
  Administered 2020-11-14: 30 mL

## 2020-11-14 MED ORDER — PROPOFOL 10 MG/ML IV BOLUS
INTRAVENOUS | Status: DC | PRN
  Administered 2020-11-14: 100 mg via INTRAVENOUS

## 2020-11-14 MED ORDER — DILTIAZEM HCL ER COATED BEADS 180 MG PO CP24
180.0000 mg | ORAL_CAPSULE | Freq: Every day | ORAL | Status: DC
  Administered 2020-11-15: 180 mg via ORAL
  Filled 2020-11-14: qty 1

## 2020-11-14 MED ORDER — NYSTATIN 100000 UNIT/ML MT SUSP
5.0000 mL | Freq: Four times a day (QID) | OROMUCOSAL | Status: DC
  Administered 2020-11-14 – 2020-11-15 (×4): 500000 [IU] via ORAL
  Filled 2020-11-14 (×4): qty 5

## 2020-11-14 MED ORDER — LIDOCAINE HCL (CARDIAC) PF 100 MG/5ML IV SOSY
PREFILLED_SYRINGE | INTRAVENOUS | Status: DC | PRN
  Administered 2020-11-14: 100 mg via INTRAVENOUS

## 2020-11-14 MED ORDER — ENOXAPARIN SODIUM 40 MG/0.4ML IJ SOSY
40.0000 mg | PREFILLED_SYRINGE | INTRAMUSCULAR | Status: DC
  Administered 2020-11-15: 40 mg via SUBCUTANEOUS
  Filled 2020-11-14: qty 0.4

## 2020-11-14 MED ORDER — GABAPENTIN 600 MG PO TABS
600.0000 mg | ORAL_TABLET | Freq: Two times a day (BID) | ORAL | Status: DC
  Administered 2020-11-14 – 2020-11-15 (×2): 600 mg via ORAL
  Filled 2020-11-14 (×2): qty 1

## 2020-11-14 MED ORDER — FLUTICASONE FUROATE-VILANTEROL 200-25 MCG/INH IN AEPB
1.0000 | INHALATION_SPRAY | Freq: Every day | RESPIRATORY_TRACT | Status: DC
  Administered 2020-11-14 – 2020-11-15 (×2): 1 via RESPIRATORY_TRACT
  Filled 2020-11-14: qty 28

## 2020-11-14 MED ORDER — FENTANYL CITRATE (PF) 250 MCG/5ML IJ SOLN
INTRAMUSCULAR | Status: AC
  Filled 2020-11-14: qty 5

## 2020-11-14 MED ORDER — FENTANYL CITRATE (PF) 100 MCG/2ML IJ SOLN
INTRAMUSCULAR | Status: AC
  Administered 2020-11-14: 25 ug via INTRAVENOUS
  Filled 2020-11-14: qty 2

## 2020-11-14 MED ORDER — PHENYLEPHRINE HCL (PRESSORS) 10 MG/ML IV SOLN
INTRAVENOUS | Status: DC | PRN
  Administered 2020-11-14: 200 ug via INTRAVENOUS
  Administered 2020-11-14 (×3): 100 ug via INTRAVENOUS

## 2020-11-14 MED ORDER — CEFAZOLIN SODIUM-DEXTROSE 2-4 GM/100ML-% IV SOLN
2.0000 g | Freq: Four times a day (QID) | INTRAVENOUS | Status: AC
  Administered 2020-11-14 – 2020-11-15 (×3): 2 g via INTRAVENOUS
  Filled 2020-11-14 (×3): qty 100

## 2020-11-14 MED ORDER — MIDAZOLAM HCL 2 MG/2ML IJ SOLN
INTRAMUSCULAR | Status: DC | PRN
  Administered 2020-11-14: 2 mg via INTRAVENOUS

## 2020-11-14 MED ORDER — VALACYCLOVIR HCL 500 MG PO TABS
500.0000 mg | ORAL_TABLET | Freq: Every day | ORAL | Status: DC | PRN
  Filled 2020-11-14: qty 1

## 2020-11-14 MED ORDER — GLYCOPYRROLATE 0.2 MG/ML IJ SOLN
INTRAMUSCULAR | Status: DC | PRN
  Administered 2020-11-14: .2 mg via INTRAVENOUS

## 2020-11-14 MED ORDER — METHOCARBAMOL 500 MG PO TABS
500.0000 mg | ORAL_TABLET | Freq: Four times a day (QID) | ORAL | Status: DC | PRN
  Administered 2020-11-14: 500 mg via ORAL
  Filled 2020-11-14: qty 1

## 2020-11-14 MED ORDER — ACETAMINOPHEN 500 MG PO TABS
1000.0000 mg | ORAL_TABLET | Freq: Four times a day (QID) | ORAL | Status: AC
  Administered 2020-11-14 – 2020-11-15 (×4): 1000 mg via ORAL
  Filled 2020-11-14 (×4): qty 2

## 2020-11-14 MED ORDER — METOPROLOL TARTRATE 25 MG PO TABS
25.0000 mg | ORAL_TABLET | Freq: Two times a day (BID) | ORAL | Status: DC
  Administered 2020-11-15: 25 mg via ORAL
  Filled 2020-11-14 (×2): qty 1

## 2020-11-14 MED ORDER — CHLORHEXIDINE GLUCONATE 0.12 % MT SOLN: 15.0000 mL | Freq: Once | OROMUCOSAL | Status: AC

## 2020-11-14 MED ORDER — OXYCODONE HCL 5 MG PO TABS
10.0000 mg | ORAL_TABLET | ORAL | Status: DC | PRN
  Administered 2020-11-14 – 2020-11-15 (×2): 15 mg via ORAL
  Filled 2020-11-14 (×2): qty 3

## 2020-11-14 MED ORDER — LACTATED RINGERS IV SOLN: INTRAVENOUS | Status: DC

## 2020-11-14 MED ORDER — LORATADINE 10 MG PO TABS
10.0000 mg | ORAL_TABLET | Freq: Every day | ORAL | Status: DC
  Administered 2020-11-14 – 2020-11-15 (×2): 10 mg via ORAL
  Filled 2020-11-14 (×2): qty 1

## 2020-11-14 MED ORDER — METHOCARBAMOL 1000 MG/10ML IJ SOLN
500.0000 mg | Freq: Four times a day (QID) | INTRAVENOUS | Status: DC | PRN
  Filled 2020-11-14: qty 5

## 2020-11-14 MED ORDER — ONDANSETRON HCL 4 MG/2ML IJ SOLN: 4.0000 mg | Freq: Four times a day (QID) | INTRAMUSCULAR | Status: DC | PRN

## 2020-11-14 MED ORDER — SODIUM CHLORIDE 0.9 % IV SOLN: INTRAVENOUS | Status: DC

## 2020-11-14 MED ORDER — ACETAMINOPHEN 10 MG/ML IV SOLN
INTRAVENOUS | Status: DC | PRN
  Administered 2020-11-14: 1000 mg via INTRAVENOUS

## 2020-11-14 MED ORDER — IPRATROPIUM-ALBUTEROL 0.5-2.5 (3) MG/3ML IN SOLN: 3.0000 mL | Freq: Four times a day (QID) | RESPIRATORY_TRACT | Status: DC | PRN

## 2020-11-14 MED ORDER — BUPIVACAINE-EPINEPHRINE (PF) 0.25% -1:200000 IJ SOLN
INTRAMUSCULAR | Status: AC
  Filled 2020-11-14: qty 30

## 2020-11-14 MED ORDER — SODIUM CHLORIDE 0.9 % IR SOLN
Status: DC | PRN
  Administered 2020-11-14: 1004 mL

## 2020-11-14 MED ORDER — IPRATROPIUM-ALBUTEROL 0.5-2.5 (3) MG/3ML IN SOLN: 3.0000 mL | RESPIRATORY_TRACT | Status: DC | PRN

## 2020-11-14 MED ORDER — BUSPIRONE HCL 10 MG PO TABS
10.0000 mg | ORAL_TABLET | Freq: Two times a day (BID) | ORAL | Status: DC
  Administered 2020-11-14 – 2020-11-15 (×2): 10 mg via ORAL
  Filled 2020-11-14 (×2): qty 1

## 2020-11-14 MED ORDER — CEFAZOLIN SODIUM-DEXTROSE 2-4 GM/100ML-% IV SOLN
2.0000 g | INTRAVENOUS | Status: AC
  Administered 2020-11-14: 2 g via INTRAVENOUS

## 2020-11-14 MED ORDER — OXYCODONE HCL 5 MG PO TABS: 5.0000 mg | ORAL_TABLET | ORAL | Status: DC | PRN

## 2020-11-14 MED ORDER — CLONAZEPAM 0.5 MG PO TABS
0.5000 mg | ORAL_TABLET | Freq: Every day | ORAL | Status: DC
  Administered 2020-11-14: 0.5 mg via ORAL
  Filled 2020-11-14: qty 1

## 2020-11-14 SURGICAL SUPPLY — 46 items
APL PRP STRL LF DISP 70% ISPRP (MISCELLANEOUS) ×1
BNDG COHESIVE 4X5 TAN ST LF (GAUZE/BANDAGES/DRESSINGS) ×1 IMPLANT
CANISTER SUCT 1200ML W/VALVE (MISCELLANEOUS) ×2 IMPLANT
CHLORAPREP W/TINT 26 (MISCELLANEOUS) ×2 IMPLANT
COVER LIGHT HANDLE STERIS (MISCELLANEOUS) ×1 IMPLANT
CUFF TOURN SGL QUICK 24 (TOURNIQUET CUFF) ×2
CUFF TOURN SGL QUICK 34 (TOURNIQUET CUFF)
CUFF TRNQT CYL 24X4X16.5-23 (TOURNIQUET CUFF) IMPLANT
CUFF TRNQT CYL 34X4.125X (TOURNIQUET CUFF) IMPLANT
DRAPE C-ARM XRAY 36X54 (DRAPES) ×2 IMPLANT
DRAPE C-ARMOR (DRAPES) ×1 IMPLANT
DRAPE INCISE IOBAN 66X45 STRL (DRAPES) ×2 IMPLANT
DRAPE U-SHAPE 47X51 STRL (DRAPES) ×1 IMPLANT
DRSG EMULSION OIL 3X8 NADH (GAUZE/BANDAGES/DRESSINGS) ×2 IMPLANT
ELECT CAUTERY BLADE 6.4 (BLADE) ×2 IMPLANT
ELECT REM PT RETURN 9FT ADLT (ELECTROSURGICAL) ×2
ELECTRODE REM PT RTRN 9FT ADLT (ELECTROSURGICAL) ×1 IMPLANT
GAUZE 4X4 16PLY ~~LOC~~+RFID DBL (SPONGE) ×2 IMPLANT
GAUZE SPONGE 4X4 12PLY STRL (GAUZE/BANDAGES/DRESSINGS) ×2 IMPLANT
GAUZE XEROFORM 1X8 LF (GAUZE/BANDAGES/DRESSINGS) ×2 IMPLANT
GLOVE SURG SYN 9.0  PF PI (GLOVE) ×2
GLOVE SURG SYN 9.0 PF PI (GLOVE) ×1 IMPLANT
GOWN SRG 2XL LVL 4 RGLN SLV (GOWNS) ×1 IMPLANT
GOWN STRL NON-REIN 2XL LVL4 (GOWNS) ×2
GOWN STRL REUS W/ TWL LRG LVL3 (GOWN DISPOSABLE) ×1 IMPLANT
GOWN STRL REUS W/TWL LRG LVL3 (GOWN DISPOSABLE) ×2
HANDLE YANKAUER SUCT BULB TIP (MISCELLANEOUS) ×1 IMPLANT
KIT TURNOVER KIT A (KITS) ×2 IMPLANT
MANIFOLD NEPTUNE II (INSTRUMENTS) ×2 IMPLANT
NDL FILTER BLUNT 18X1 1/2 (NEEDLE) ×1 IMPLANT
NEEDLE FILTER BLUNT 18X 1/2SAF (NEEDLE) ×1
NEEDLE FILTER BLUNT 18X1 1/2 (NEEDLE) ×1 IMPLANT
NS IRRIG 1000ML POUR BTL (IV SOLUTION) ×2 IMPLANT
PACK EXTREMITY ARMC (MISCELLANEOUS) ×2 IMPLANT
PAD ABD DERMACEA PRESS 5X9 (GAUZE/BANDAGES/DRESSINGS) ×5 IMPLANT
SCALPEL PROTECTED #15 DISP (BLADE) ×4 IMPLANT
STAPLER SKIN PROX 35W (STAPLE) ×2 IMPLANT
STOCKINETTE IMPERV 14X48 (MISCELLANEOUS) ×1 IMPLANT
SUT ETHIBOND NAB CT1 #1 30IN (SUTURE) ×2 IMPLANT
SUT ETHILON 3-0 FS-10 30 BLK (SUTURE) ×2
SUT VIC AB 0 CT1 36 (SUTURE) ×2 IMPLANT
SUT VIC AB 2-0 CT1 27 (SUTURE) ×2
SUT VIC AB 2-0 CT1 TAPERPNT 27 (SUTURE) ×1 IMPLANT
SUTURE EHLN 3-0 FS-10 30 BLK (SUTURE) ×1 IMPLANT
SYR 10ML LL (SYRINGE) ×2 IMPLANT
WATER STERILE IRR 1000ML POUR (IV SOLUTION) ×2 IMPLANT

## 2020-11-14 NOTE — H&P (Signed)
Chief Complaint  Patient presents with   Left Thigh - Follow-up, Pain    History of the Present Illness: Summer Murphy is a 65 y.o. female here today.   The patient presents for evaluation of left leg pain. The patient had a left total knee arthroplasty on 11/14/2019. She subsequently sustained a distal femur fracture treated with ORIF at [facility]. She has developed some deformity, but her hardware is quite prominent, and she comes in for H & P for deep hardware removal, distal lateral supracondylar femoral Synthes VA plate with multiple screws secondary to pain along the IT band.  The patient states she has pain to her left leg. She presents with a female companion.   The patient is scheduled for a COVID-19 test on 11/12/2020. She is getting iron infusions.  I have reviewed past medical, surgical, social and family history, and allergies as documented in the EMR.  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   Shingles   Past Surgical History: Past Surgical History:  Procedure Laterality Date   APPENDECTOMY   Arthroscopy, partial synovectomy, left knee Left 03/01/14   CHOLECYSTECTOMY   COLONOSCOPY 01/21/2001  Adenomatous Polyp   COLONOSCOPY 01/15/2012, 06/10/2004  PH Adenomatous Polyp: CBF 12/2016; Recall Ltr mailed 12/04/2016 (dw)   COLONOSCOPY 01/04/2017  Aborted d/t Poor Prep: FU GI OV made 02/25/2017 @ 2pm w/MJohnson PA (dw)   EGD 04/17/1986, 03/08/1987, 06/12/1987, 09/16/1987, 03/18/1988, 03/07/1991, 01/21/2001, 06/10/2004, 11/04/2005, 06/08/2008, 07/12/2008,  05/06/2009, 07/03/2009, 09/18/2009, 06/29/2011, 11/10/2011, 01/15/2012   EGD 01/16/2013  Barrett's Esophagus ([facility])   EGD 04/28/2016  Barrett's Esophagus ([facility])   EGD 01/04/2017  No Barrett's Seen: No repeat per RTE   HYSTERECTOMY   JOINT REPLACEMENT Right 10/29/2011  RIGHT TOTAL KNEE REPLACEMENT   Left total knee replacement Left [id number]   Nissen fundoplication x2   OPEN REDUCTION FEMORAL FRACTURE SUPRACONDYLAR/TRANSCONDYLAR Left 02/17/2020  Procedure: OPEN TREATMENT OF FEMORAL SUPRACONDYLAR OR TRANSCONDYLAR FRACTURE WITHINTERCONDYLAR EXTENSION, INCLUDES INTERNAL FIXATION, WHEN PERFORMED; Surgeon: Dorian Pod, MD; Location: [facility]; Service: Orthopedics; Laterality: Left;   REDUCTION MAMMAPLASTY   SIGMOIDOSCOPY FLEXIBLE 12/21/1985, 03/11/1987, 02/04/1990   Total Knee Revision Surgery Right 11/14/2019  Rudene Christians, poly exchange   Past Family History: Family History  Problem Relation Age of Onset   Stroke Mother   Diabetes Father   Angina Father   Medications: Current Outpatient Medications Ordered in Epic  Medication Sig Dispense Refill   acetaminophen (TYLENOL) 500 MG tablet Take 1,000 mg by mouth every 8 (eight) hours as needed for Pain   BIOTIN ORAL Take by mouth   busPIRone (BUSPAR) 10 MG tablet Take 10 mg by mouth 2 (two) times daily   citalopram (CELEXA) 10 MG tablet Take 10 mg by mouth   clonazePAM (KLONOPIN) 0.5 MG tablet Take 1 tablet (0.5 mg total) by mouth nightly as needed for Anxiety 5 tablet 0   clotrimazole-betamethasone (LOTRISONE) 1-0.05 % cream Apply 1 Application topically 2 (two) times daily   COMBIVENT RESPIMAT 20-100 mcg/actuation inhaler Inhale 1 inhalation into the lungs 4 (four) times daily   diclofenac (VOLTAREN) 1 %  topical gel Apply 2 g topically 3 (three) times daily 100 g 1   diltiazem (DILT-XR) 180 MG XR capsule Take 1 capsule by mouth once daily.   diphenhydrAMINE (BENADRYL) 25 mg tablet Take 1 tablet by mouth 2 (two) times daily as needed    diphenoxylate-atropine (LOMOTIL) 2.5-0.025 mg tablet Take 1 tablet by mouth 4 (four) times daily as needed for Diarrhea 30 tablet 2   DULoxetine (CYMBALTA) 60 MG DR capsule Take 60 mg by mouth once daily   famotidine (PEPCID) 40 MG tablet TAKE 1 TABLET(40 MG) BY MOUTH EVERY NIGHT AS NEEDED FOR HEARTBURN 90 tablet 0   fluticasone furoate-vilanteroL (BREO ELLIPTA) 200-25 mcg/dose DsDv Inhale 1 inhalation into the lungs once daily   gabapentin (NEURONTIN) 600 MG tablet Take 600 mg by mouth every 12 (twelve) hours   loratadine (CLARITIN) 10 mg tablet Take 1 tablet by mouth once daily   metoprolol tartrate (LOPRESSOR) 25 MG tablet Take 1 tablet by mouth once daily   montelukast (SINGULAIR) 10 mg tablet Take 10 mg by mouth once daily as needed.   omeprazole (PRILOSEC) 40 MG DR capsule TAKE 1 CAPSULE(40 MG) BY MOUTH TWICE DAILY BEFORE MEALS 60 capsule 0   oxybutynin (DITROPAN) 5 mg tablet Take 5 mg by mouth 2 (two) times daily as needed   oxyCODONE (ROXICODONE) 5 MG immediate release tablet Take 1 tablet by mouth nightly   pantoprazole (PROTONIX) 40 MG DR tablet   REXULTI 4 mg Tab Take 1 tablet by mouth every morning   rOPINIRole (REQUIP) 0.5 MG tablet Take two tablets po BID for restless legs.   SPIRIVA RESPIMAT 2.5 mcg/actuation inhalation spray INHALE 1 PUFF ITL BID   SUMAtriptan (IMITREX) 100 MG tablet Take by mouth   SYMBICORT 160-4.5 mcg/actuation inhaler 1 spray 2 (two) times daily.   traMADoL (ULTRAM) 50 mg tablet Take 1 tablet by mouth every 6 (six) hours   traZODone (DESYREL) 50 MG tablet Take 50 mg by mouth nightly Take one tab by mouth nightly. May repeat once if needed   No current Epic-ordered facility-administered medications on file.   Allergies: Allergies  Allergen Reactions   Metoclopramide Other (See Comments) and Hives  hallucinations hallucinations hallucinations   Nsaids (Non-Steroidal Anti-Inflammatory Drug) Other (See Comments) and Rash  Other Reaction:  Allergy Rash/flares asthma issues.   Vanilla Butternut Flavor(Bulk) Anaphylaxis  anaphylaxis   Aspirin Unknown   Azithromycin Unknown   Chlordiazepoxide Unknown   Iron Unknown   Metoclopramide Unknown   Morphine Other (See Comments)  Unsure of reaction Unsure of reaction   Reglan [Metoclopramide Hcl] Hallucination   Sulfa (Sulfonamide Antibiotics) Unknown   Amlodipine Besylate Rash  Rash , itching , arms, stomach and forehead Rash , itching , arms, stomach and forehead   Iron Nausea And Vomiting and Other (See Comments)  Other reaction(s): Unknown Other reaction(s): Unknown    Body mass index is 30.03 kg/m.  Review of Systems: A comprehensive 14 point ROS was performed, reviewed, and the pertinent orthopaedic findings are documented in the HPI.  Vitals:  11/06/20 1314  BP: 138/82    General Physical Examination:   General/Constitutional: No apparent distress: well-nourished and well developed. Eyes: Pupils equal, round with synchronous movement. Lungs:  Clear to auscultation HEENT is poor in fair condition. Vascular: No edema, swelling or tenderness, except as noted in detailed exam. Cardiac: Heart rate and rhythm is regular. Integumentary: No impressive skin lesions present, except as noted in detailed exam. Neuro/Psych: Normal mood and affect, oriented to  person, place and time.  Musculoskeletal Examination:   On exam, good left knee flexion and extension. Stable left knee. Left knee range of motion is 5-110 degrees with good stability. Healed scar to the left knee. Very prominent lateral plate around the left knee.   Radiographs:  No new imaging studies were obtained today.  Assessment: ICD-10-CM  1. Painful orthopaedic hardware (CMS-HCC) [phone]   Plan:  The patient has clinical findings of painful hardware of the left knee around the total knee.  We discussed the patient's prior x-ray findings. I explained the removal of the Synthes plate and  multiple screws. The patient has surgery set for 11/14/2020.  Surgical Risks:  The nature of the condition and the proposed procedure has been reviewed in detail with the patient. Surgical versus non-surgical options and prognosis for recovery have been reviewed and the inherent risks and benefits of each have been discussed including the risks of infection, bleeding, injury to nerves/blood vessels/tendons, incomplete relief of symptoms, persisting pain and/or stiffness, loss of function, complex regional pain syndrome, failure of the procedure, as appropriate.  Teeth: Poor to fair condition.  Attestation: I, Dawn Royse, am documenting for [employer], MD utilizing [place].    Electronically signed by Lauris Poag, MD at 11/07/2020 7:43 PM EDT  Reviewed  H+P. No changes noted.

## 2020-11-14 NOTE — Op Note (Signed)
11/14/2020  1:22 PM  PATIENT:  Summer Murphy  65 y.o. female  PRE-OPERATIVE DIAGNOSIS: Painful hardware lateral femur  POST-OPERATIVE DIAGNOSIS:  PAINFUL HARDWARE  PROCEDURE:  Procedure(s): HARDWARE REMOVAL (Left)  SURGEON: Laurene Footman, MD  ASSISTANTS: None  ANESTHESIA:   general  EBL:  Total I/O In: 700 [I.V.:500; IV Piggyback:200] Out: 200 [Blood:200]  BLOOD ADMINISTERED:none  DRAINS: none   LOCAL MEDICATIONS USED:  MARCAINE     SPECIMEN:  No Specimen  DISPOSITION OF SPECIMEN:  N/A  COUNTS:  YES  TOURNIQUET:  * No tourniquets in log *  IMPLANTS: None  DICTATION: .Dragon Dictation patient was brought to the operating room and after adequate anesthesia was obtained the left leg was prepped and draped in usual sterile fashion.  After patient identification timeout procedures were completed the distal end of the prior incision was opened with exposure of the supracondylar portion of the plate there were 4 screws which were removed except for 1 which was spinning and would not come out attention was then turned to the more proximal screws with a small incision made approximately the remaining cortical and locking cortical screws were removed without much difficulty.  To get that final screw out distally took quite a bit of an effort of eventually the plate was brought out away from the femur and the screw cut with a bolt cutter and then the plate removed.  Unfortunately a great deal of gluteal muscle with had gotten attached to the plate and had to be stripped off the plate with to get it removed.  The broken screw from that event cut off was then removed using the broken screw set without much difficulty.  The wounds were thoroughly irrigated and deep closure with 0 Vicryl in a running manner 2-0 Vicryl and skin staples with Xeroform 4 x 4's ABD and foam tape applied permanent C-arm views obtained showing hardware removal  PLAN OF CARE: Admit for overnight  observation  PATIENT DISPOSITION:  PACU - hemodynamically stable.

## 2020-11-14 NOTE — Transfer of Care (Signed)
Immediate Anesthesia Transfer of Care Note  Patient: Summer Murphy  Procedure(s) Performed: HARDWARE REMOVAL (Left: Leg Upper)  Patient Location: PACU  Anesthesia Type:General  Level of Consciousness: sedated  Airway & Oxygen Therapy: Patient Spontanous Breathing and Patient connected to face mask oxygen  Post-op Assessment: Report given to RN and Post -op Vital signs reviewed and stable  Post vital signs: Reviewed and stable  Last Vitals:  Vitals Value Taken Time  BP 101/44 11/14/20 1330  Temp    Pulse 72 11/14/20 1335  Resp 9 11/14/20 1335  SpO2 100 % 11/14/20 1335  Vitals shown include unvalidated device data.  Last Pain:  Vitals:   11/14/20 1058  TempSrc: Temporal  PainSc: 7          Complications: No notable events documented.

## 2020-11-14 NOTE — Anesthesia Postprocedure Evaluation (Signed)
Anesthesia Post Note  Patient: Summer Murphy  Procedure(s) Performed: HARDWARE REMOVAL (Left: Leg Upper)  Patient location during evaluation: PACU Anesthesia Type: General Level of consciousness: awake and alert, awake and oriented Pain management: pain level controlled Vital Signs Assessment: post-procedure vital signs reviewed and stable Respiratory status: spontaneous breathing, nonlabored ventilation and respiratory function stable Cardiovascular status: blood pressure returned to baseline and stable Postop Assessment: no apparent nausea or vomiting Anesthetic complications: no   No notable events documented.   Last Vitals:  Vitals:   11/14/20 1430 11/14/20 1456  BP: 93/74   Pulse: 85 86  Resp: (!) 9   Temp:    SpO2: 94% 94%    Last Pain:  Vitals:   11/14/20 1456  TempSrc:   PainSc: 5                  Phill Mutter

## 2020-11-14 NOTE — Anesthesia Procedure Notes (Signed)
Procedure Name: LMA Insertion Date/Time: 11/14/2020 12:08 PM Performed by: Nelda Marseille, CRNA Pre-anesthesia Checklist: Patient identified, Patient being monitored, Timeout performed, Emergency Drugs available and Suction available Patient Re-evaluated:Patient Re-evaluated prior to induction Oxygen Delivery Method: Circle system utilized Preoxygenation: Pre-oxygenation with 100% oxygen Induction Type: IV induction Ventilation: Mask ventilation without difficulty LMA: LMA inserted LMA Size: 4.0 Tube type: Oral Number of attempts: 1 Placement Confirmation: positive ETCO2 and breath sounds checked- equal and bilateral Tube secured with: Tape Dental Injury: Teeth and Oropharynx as per pre-operative assessment

## 2020-11-14 NOTE — Anesthesia Preprocedure Evaluation (Signed)
Anesthesia Evaluation  Patient identified by MRN, date of birth, ID band Patient awake    Reviewed: Allergy & Precautions, NPO status , Patient's Chart, lab work & pertinent test results, reviewed documented beta blocker date and time   History of Anesthesia Complications Negative for: history of anesthetic complications  Airway Mallampati: II  TM Distance: >3 FB Neck ROM: Full    Dental  (+) Poor Dentition   Pulmonary asthma , neg sleep apnea, COPD,  COPD inhaler,    breath sounds clear to auscultation- rhonchi (-) wheezing      Cardiovascular hypertension, Pt. on medications and Pt. on home beta blockers (-) CAD, (-) Past MI and (-) Cardiac Stents Dysrhythmias: hx of ablation.  Rhythm:Regular Rate:Normal - Systolic murmurs and - Diastolic murmurs    Neuro/Psych  Headaches, PSYCHIATRIC DISORDERS Anxiety Depression  Neuromuscular disease    GI/Hepatic GERD  ,  Endo/Other  diabetes  Renal/GU Renal disease (hx of nephrolithiasis)     Musculoskeletal  (+) Arthritis , Rheumatoid disorders,    Abdominal (+) + obese,   Peds  Hematology negative hematology ROS (+) anemia ,   Anesthesia Other Findings . Allergic state  . Arthritis  . Asthma without status asthmaticus, unspecified  . Chickenpox  . Depression  . Diabetes mellitus type 2, uncomplicated (CMS-HCC)  . GERD (gastroesophageal reflux disease)  . Hyperlipidemia  . Hypertension  . Insomnia  . Kidney stones  . Kidney stones  . Migraines  . Osteoporosis  . Rheumatoid arthritis(714.0) (CMS-HCC)  . 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 Shingles     Reproductive/Obstetrics                             Anesthesia Physical  Anesthesia Plan  ASA: 3  Anesthesia Plan: General   Post-op Pain Management:    Induction: Intravenous  PONV Risk Score and Plan: 2 and Midazolam  Airway Management Planned: LMA  Additional  Equipment:   Intra-op Plan:   Post-operative Plan: Extubation in OR  Informed Consent: I have reviewed the patients History and Physical, chart, labs and discussed the procedure including the risks, benefits and alternatives for the proposed anesthesia with the patient or authorized representative who has indicated his/her understanding and acceptance.     Dental advisory given  Plan Discussed with: CRNA, Anesthesiologist and Surgeon  Anesthesia Plan Comments:        Anesthesia Quick Evaluation

## 2020-11-15 ENCOUNTER — Encounter: Payer: Self-pay | Admitting: Orthopedic Surgery

## 2020-11-15 DIAGNOSIS — I10 Essential (primary) hypertension: Secondary | ICD-10-CM | POA: Diagnosis not present

## 2020-11-15 DIAGNOSIS — Z96651 Presence of right artificial knee joint: Secondary | ICD-10-CM | POA: Diagnosis not present

## 2020-11-15 DIAGNOSIS — E119 Type 2 diabetes mellitus without complications: Secondary | ICD-10-CM | POA: Diagnosis not present

## 2020-11-15 DIAGNOSIS — E538 Deficiency of other specified B group vitamins: Secondary | ICD-10-CM

## 2020-11-15 DIAGNOSIS — T8484XA Pain due to internal orthopedic prosthetic devices, implants and grafts, initial encounter: Secondary | ICD-10-CM | POA: Diagnosis not present

## 2020-11-15 DIAGNOSIS — Z79899 Other long term (current) drug therapy: Secondary | ICD-10-CM | POA: Diagnosis not present

## 2020-11-15 DIAGNOSIS — J45909 Unspecified asthma, uncomplicated: Secondary | ICD-10-CM | POA: Diagnosis not present

## 2020-11-15 DIAGNOSIS — J449 Chronic obstructive pulmonary disease, unspecified: Secondary | ICD-10-CM | POA: Diagnosis not present

## 2020-11-15 MED ORDER — CYANOCOBALAMIN 1000 MCG/ML IJ SOLN
1000.0000 ug | Freq: Once | INTRAMUSCULAR | Status: DC
  Administered 2020-11-15: 1000 ug via INTRAMUSCULAR

## 2020-11-15 MED ORDER — SODIUM CHLORIDE 0.9 % IV BOLUS
1000.0000 mL | Freq: Once | INTRAVENOUS | Status: AC
  Administered 2020-11-15: 1000 mL via INTRAVENOUS

## 2020-11-15 MED ORDER — SENNA-DOCUSATE SODIUM 8.6-50 MG PO TABS: 2.0000 | ORAL_TABLET | Freq: Every day | ORAL | 1 refills | Status: DC

## 2020-11-15 MED ORDER — ACETAMINOPHEN 500 MG PO TABS: 1000.0000 mg | ORAL_TABLET | Freq: Four times a day (QID) | ORAL | 0 refills | Status: DC

## 2020-11-15 MED ORDER — OXYCODONE HCL 5 MG PO TABS: 5.0000 mg | ORAL_TABLET | ORAL | 0 refills | Status: DC | PRN

## 2020-11-15 MED ORDER — ENOXAPARIN SODIUM 40 MG/0.4ML IJ SOSY: 40.0000 mg | PREFILLED_SYRINGE | INTRAMUSCULAR | 0 refills | Status: DC

## 2020-11-15 NOTE — Plan of Care (Signed)

## 2020-11-15 NOTE — Evaluation (Signed)
Physical Therapy Evaluation Patient Details Name: Summer Murphy MRN: [id number] DOB: October 07, 1955 Today's Date: 11/15/2020   History of Present Illness  Pt is a 65 y/o F who presented for evaluation of L leg pain. Pt had a L TKA on 11/14/19 & subsequently sustained a distal femur fx, treated with ORIF at [facility]. Pt developed some deformity & comes in for hardware removal. PMH: depression, DM2, GERD, HLD, HTN, migraines, osteoporosis, RA, s/p revision of R TKA (11/14/19), shingles  Clinical Impression  Pt seen for PT evaluation with pt mobilizing well as she was able to ambulate 1 lap around nurses station with close supervision. Pt c/o 4/10 pain in LLE but notes this is better compared to before surgery. Provided pt with supine HEP handout & pt performs exercises as noted below. Pt does c/o fatigue during session & notes her BP has been running low. BP after gait once back in bed: 102/53 mmHg in RUE (MAP 66). After supine exercises BP 94/45 mmHg in RUE (MAP 61). Nurse notified of pt's c/o & of BP readings. Will continue to follow pt acutely to progress gait with LRAD & LLE strengthening.     Follow Up Recommendations Home health PT;Supervision - Intermittent    Equipment Recommendations  None recommended by PT (pt reports she already has RW)    Recommendations for Other Services       Precautions / Restrictions Precautions Precautions: Fall Restrictions Weight Bearing Restrictions: Yes LLE Weight Bearing: Weight bearing as tolerated      Mobility  Bed Mobility Overal bed mobility: Modified Independent             General bed mobility comments: supine<>sit with HOB elevated, bed rails PRN    Transfers Overall transfer level: Modified independent Equipment used: Rolling walker (2 wheeled)             General transfer comment: sit<>stand with RW with mod I  Ambulation/Gait Ambulation/Gait assistance: Supervision Gait Distance (Feet): 170 Feet Assistive device: Rolling  walker (2 wheeled) Gait Pattern/deviations: Decreased stride length;Decreased weight shift to left;Decreased step length - right Gait velocity: slightly decreased      Stairs            Wheelchair Mobility    Modified Rankin (Stroke Patients Only)       Balance Overall balance assessment: Mild deficits observed, not formally tested Sitting-balance support: No upper extremity supported;Feet supported Sitting balance-Leahy Scale: Normal     Standing balance support: During functional activity;Bilateral upper extremity supported Standing balance-Leahy Scale: Fair Standing balance comment: BUE support on RW                             Pertinent Vitals/Pain Pain Assessment: 0-10 Pain Score: 4  Pain Location: LLE Pain Descriptors / Indicators: Discomfort Pain Intervention(s): Monitored during session    Home Living Family/patient expects to be discharged to:: Private residence Living Arrangements: Alone Available Help at Discharge: Family;Friend(s);Available PRN/intermittently Type of Home: Apartment Home Access: Elevator (lives on 2nd floor apartment building with elevator access)     Home Layout: One level Home Equipment: Environmental consultant - 2 wheels;Cane - quad      Prior Function Level of Independence: Independent with assistive device(s)         Comments: Ambulatory with QC.     Hand Dominance        Extremity/Trunk Assessment   Upper Extremity Assessment Upper Extremity Assessment: Overall WFL for tasks assessed  Lower Extremity Assessment Lower Extremity Assessment:  (LLE not formally assessed 2/2 pain, grossly 3+/5)       Communication   Communication: No difficulties  Cognition Arousal/Alertness: Awake/alert Behavior During Therapy: WFL for tasks assessed/performed Overall Cognitive Status: Within Functional Limits for tasks assessed                                        General Comments General comments (skin  integrity, edema, etc.): Multiple small spots & 1 large spot of blood on chuck pad underneath pt's LLE but no visible bleeding from dressing. Nurse notified & assessed. At end of session, after performing heel slides pt with small spot of blood on chuck pad underneath knee & nurse called to assess pt.    Exercises General Exercises - Lower Extremity Ankle Circles/Pumps: AROM;Left;10 reps;Supine Quad Sets: AROM;Strengthening;Left;10 reps;Supine Heel Slides: AROM;Strengthening;Left;10 reps;Supine Hip ABduction/ADduction: AROM;Strengthening;Left;10 reps;Supine (hip abduction slides) Straight Leg Raises: AROM;Strengthening;Left;10 reps;Supine   Assessment/Plan    PT Assessment Patient needs continued PT services  PT Problem List Decreased strength;Decreased mobility;Decreased balance;Decreased knowledge of use of DME;Pain;Decreased activity tolerance;Decreased range of motion       PT Treatment Interventions DME instruction;Therapeutic activities;Modalities;Gait training;Therapeutic exercise;Patient/family education;Stair training;Wheelchair mobility training;Balance training;Manual techniques    PT Goals (Current goals can be found in the Care Plan section)  Acute Rehab PT Goals Patient Stated Goal: get better, go home PT Goal Formulation: With patient Time For Goal Achievement: 11/29/20 Potential to Achieve Goals: Good    Frequency 7X/week   Barriers to discharge Decreased caregiver support lives alone    Co-evaluation               AM-PAC PT "6 Clicks" Mobility  Outcome Measure Help needed turning from your back to your side while in a flat bed without using bedrails?: None Help needed moving from lying on your back to sitting on the side of a flat bed without using bedrails?: A Little Help needed moving to and from a bed to a chair (including a wheelchair)?: A Little Help needed standing up from a chair using your arms (e.g., wheelchair or bedside chair)?: A Little Help  needed to walk in hospital room?: A Little Help needed climbing 3-5 steps with a railing? : A Little 6 Click Score: 19    End of Session Equipment Utilized During Treatment: Gait belt Activity Tolerance: Patient tolerated treatment well;Patient limited by fatigue Patient left: in bed;with call bell/phone within reach;with bed alarm set Nurse Communication: Mobility status (blood on chuck pad, low BP & pt's c/o fatigue) PT Visit Diagnosis: Muscle weakness (generalized) (M62.81);Difficulty in walking, not elsewhere classified (R26.2);Unsteadiness on feet (R26.81)    Time: [id number] PT Time Calculation (min) (ACUTE ONLY): 28 min   Charges:   PT Evaluation $PT Eval Low Complexity: 1 Low PT Treatments $Therapeutic Activity: 8-22 mins        Lavone Nian, PT, DPT 11/15/20, 1:49 PM   Waunita Schooner 11/15/2020, 1:47 PM

## 2020-11-15 NOTE — Progress Notes (Signed)
D/C paperwork given to patient.  New dressing applied before d/c.  Extra dressings given to the patient. No unanswered questions.  IV removed. Tip intact. All belongings with patient. Ride is here and will transport to home via private vehicle.

## 2020-11-15 NOTE — Discharge Instructions (Signed)
Diet: As you were doing prior to hospitalization   Shower:  May shower but keep the wounds dry, use an occlusive plastic wrap, NO SOAKING IN TUB.  If the bandage gets wet, change with a clean dry gauze.  Dressing:  You may change your dressing as needed.   Activity:  Increase activity slowly as tolerated, but follow the weight bearing instructions below.  No lifting or driving for 6 weeks.  Weight Bearing:   Weight bearing as tolerated to left lower extremity  To prevent constipation: you may use a stool softener such as -  Colace (over the counter) 100 mg by mouth twice a day  Drink plenty of fluids (prune juice may be helpful) and high fiber foods Miralax (over the counter) for constipation as needed.    Itching:  If you experience itching with your medications, try taking only a single pain pill, or even half a pain pill at a time.  You may take up to 10 pain pills per day, and you can also use benadryl over the counter for itching or also to help with sleep.   Precautions:  If you experience chest pain or shortness of breath - call 911 immediately for transfer to the hospital emergency department!!  If you develop a fever greater that 101 F, purulent drainage from wound, increased redness or drainage from wound, or calf pain-Call [place]                                               Follow- Up Appointment:  Please call for an appointment to be seen in 2 weeks at [facility]

## 2020-11-15 NOTE — Plan of Care (Signed)
  Problem: Pain Managment: Goal: General experience of comfort will improve Outcome: Progressing   Problem: Skin Integrity: Goal: Risk for impaired skin integrity will decrease Outcome: Progressing   

## 2020-11-15 NOTE — Plan of Care (Signed)
  Problem: Education: Goal: Knowledge of General Education information will improve Description: Including pain rating scale, medication(s)/side effects and non-pharmacologic comfort measures 11/15/2020 1600 by Viaan Knippenberg, Debbe Mounts, RN Outcome: Completed/Met 11/15/2020 1559 by Elsie Ra, RN Outcome: Progressing   Problem: Health Behavior/Discharge Planning: Goal: Ability to manage health-related needs will improve 11/15/2020 1600 by Olie Dibert, Debbe Mounts, RN Outcome: Completed/Met 11/15/2020 1559 by Elsie Ra, RN Outcome: Progressing   Problem: Clinical Measurements: Goal: Ability to maintain clinical measurements within normal limits will improve 11/15/2020 1600 by Johnye Kist, Debbe Mounts, RN Outcome: Completed/Met 11/15/2020 1559 by Elsie Ra, RN Outcome: Progressing Goal: Will remain free from infection 11/15/2020 1600 by Kresha Abelson, Debbe Mounts, RN Outcome: Completed/Met 11/15/2020 1559 by Elsie Ra, RN Outcome: Progressing Goal: Diagnostic test results will improve 11/15/2020 1600 by Yocelyn Brocious, Debbe Mounts, RN Outcome: Completed/Met 11/15/2020 1559 by Elsie Ra, RN Outcome: Progressing Goal: Respiratory complications will improve 11/15/2020 1600 by Journee Bobrowski, Debbe Mounts, RN Outcome: Completed/Met 11/15/2020 1559 by Elsie Ra, RN Outcome: Progressing Goal: Cardiovascular complication will be avoided 11/15/2020 1600 by Elsie Ra, RN Outcome: Completed/Met 11/15/2020 1559 by Elsie Ra, RN Outcome: Progressing   Problem: Activity: Goal: Risk for activity intolerance will decrease 11/15/2020 1600 by Keli Buehner, Debbe Mounts, RN Outcome: Completed/Met 11/15/2020 1559 by Elsie Ra, RN Outcome: Progressing   Problem: Nutrition: Goal: Adequate nutrition will be maintained 11/15/2020 1600 by Elsie Ra, RN Outcome: Completed/Met 11/15/2020 1559 by Elsie Ra, RN Outcome: Progressing   Problem: Coping: Goal: Level  of anxiety will decrease 11/15/2020 1600 by Glorine Hanratty, Debbe Mounts, RN Outcome: Completed/Met 11/15/2020 1559 by Elsie Ra, RN Outcome: Progressing   Problem: Elimination: Goal: Will not experience complications related to bowel motility 11/15/2020 1600 by Carnelia Oscar, Debbe Mounts, RN Outcome: Completed/Met 11/15/2020 1559 by Elsie Ra, RN Outcome: Progressing Goal: Will not experience complications related to urinary retention 11/15/2020 1600 by Jahsir Rama, Debbe Mounts, RN Outcome: Completed/Met 11/15/2020 1559 by Elsie Ra, RN Outcome: Progressing   Problem: Pain Managment: Goal: General experience of comfort will improve 11/15/2020 1600 by Page Pucciarelli, Debbe Mounts, RN Outcome: Completed/Met 11/15/2020 1559 by Elsie Ra, RN Outcome: Progressing   Problem: Safety: Goal: Ability to remain free from injury will improve 11/15/2020 1600 by Clarissa Laird, Debbe Mounts, RN Outcome: Completed/Met 11/15/2020 1559 by Elsie Ra, RN Outcome: Progressing   Problem: Skin Integrity: Goal: Risk for impaired skin integrity will decrease 11/15/2020 1600 by Elsie Ra, RN Outcome: Completed/Met 11/15/2020 1559 by Elsie Ra, RN Outcome: Progressing

## 2020-11-15 NOTE — Progress Notes (Signed)
   Subjective: 1 Day Post-Op Procedure(s) (LRB): HARDWARE REMOVAL (Left) Patient reports pain as mild.   Patient is well, and has had no acute complaints or problems Denies any CP, SOB, ABD pain. We will continue therapy today.  Plan is to go Home after hospital stay.  Objective: Vital signs in last 24 hours: Temp:  [97.2 F (36.2 C)-98.4 F (36.9 C)] 97.8 F (36.6 C) (08/26 0426) Pulse Rate:  [70-90] 79 (08/26 0426) Resp:  [8-100] 19 (08/26 0426) BP: (90-131)/(44-101) 116/55 (08/26 0426) SpO2:  [94 %-100 %] 95 % (08/26 0426)  Intake/Output from previous day: 08/25 0701 - 08/26 0700 In: 2904.9 [P.O.:240; I.V.:2064.9; IV Piggyback:600] Out: 200 [Blood:200] Intake/Output this shift: No intake/output data recorded.  Recent Labs    11/14/20 1653  HGB 11.4*   Recent Labs    11/14/20 1653  WBC 14.4*  RBC 4.16  HCT 35.8*  PLT 211   Recent Labs    11/14/20 1653  CREATININE 0.73   No results for input(s): LABPT, INR in the last 72 hours.  EXAM General - Patient is Alert, Appropriate, and Oriented Extremity - Neurovascular intact Sensation intact distally Intact pulses distally Dorsiflexion/Plantar flexion intact No cellulitis present Compartment soft Dressing - dressing C/D/I and no drainage Motor Function - intact, moving foot and toes well on exam.   Past Medical History:  Diagnosis Date   Acid reflux    Anemia    Anxiety    Arrhythmia    treated with meds and has no current problems   Arthritis    most uncomfortable in knees   Asthma    uses several inhalers   Depression    Fever blister    Hematuria    History of kidney stones    Hypertension    Hypoglycemia    Left flank pain    Migraine    Pre-diabetes    Restless leg    Yeast vaginitis     Assessment/Plan:   1 Day Post-Op Procedure(s) (LRB): HARDWARE REMOVAL (Left) Active Problems:   S/P hardware removal  Estimated body mass index is 30.11 kg/m as calculated from the following:    Height as of 11/04/20: '5\' 3"'$  (1.6 m).   Weight as of 11/04/20: 77.1 kg. Advance diet Up with therapy VSS Labs stable Pain well controlled CM to assist with discharge to home with HHPT  DVT Prophylaxis - Foot Pumps and TED hose, lovenox Weight-Bearing as tolerated to left leg   T. Rachelle Hora, PA-C [place] 11/15/2020, 7:57 AM

## 2020-11-15 NOTE — Progress Notes (Addendum)
Met with the patient in the room at the bedside to discuss DC plan and needs She lives alone in an apartment but has a lot of help from friends and family She has a rolling walker at home and does not need addiitonal DME Her cousin does her shopping for her Her friend provides transportation [facility] not able to accept for [facility] due to staffing I requested [facility] and [facility] , and [facility] and they are unable to accept,  Will continue to search for [facility] and notify the patient once found [facility] has accepted the patient for home health, notified the patient

## 2020-11-15 NOTE — Discharge Summary (Signed)
Physician Discharge Summary  Patient ID: Summer Murphy MRN: [id number] DOB/AGE: Jun 24, 1955 65 y.o.  Admit date: 11/14/2020 Discharge date: 11/15/2020  Admission Diagnoses:  S/P hardware removal [Z98.890]   Discharge Diagnoses: Patient Active Problem List   Diagnosis Date Noted   S/P hardware removal 11/14/2020   IDA (iron deficiency anemia) 10/18/2020   Moderate persistent asthma with acute exacerbation 08/21/2020   S/P revision of total knee, right 11/14/2019   Seasonal allergic rhinitis due to pollen 09/20/2019   Callus of foot 08/13/2019   Candidal vaginitis 08/13/2019   Other symptoms and signs involving the nervous system 08/13/2019   Encounter for general adult medical examination with abnormal findings 08/13/2019   Restless leg syndrome 04/30/2019   Type 2 diabetes mellitus with hyperglycemia ([place]) 04/02/2019   Muscle cramps 0000000   Diastolic dysfunction [id number]   SOB (shortness of breath) 12/14/2018   Hiatal hernia with GERD 12/14/2018   Wheezing 12/14/2018   Impaired fasting glucose 12/14/2018   COPD with acute exacerbation ([place]) 09/28/2018   Acute upper respiratory infection 08/25/2018   Recurrent sinusitis 08/15/2018   Prediabetes 08/15/2018   Nonintractable headache 08/15/2018   Dysuria 08/15/2018   Intractable migraine without status migrainosus 04/22/2018   Nausea 04/22/2018   Polyneuropathy associated with underlying disease ([place]) 04/22/2018   Midline low back pain without sciatica 04/22/2018   Routine cervical smear 04/22/2018   Essential hypertension 07/01/2017   Acute bronchitis with asthma 07/01/2017   Allergic rhinitis 07/01/2017   Moderate asthma without complication Q000111Q   Severe recurrent major depression without psychotic features ([place]) 09/10/2014   COPD (chronic obstructive pulmonary disease) ([place]) 09/10/2014   Calculi, ureter 04/26/2013   Corneal graft malfunction 08/17/2012   Calculus of kidney 99991111   Renal colic  99991111   Urge incontinence 04/26/2012   Diaphragmatic hernia 10/27/2010   Barrett esophagus 07/07/2010    Past Medical History:  Diagnosis Date   Acid reflux    Anemia    Anxiety    Arrhythmia    treated with meds and has no current problems   Arthritis    most uncomfortable in knees   Asthma    uses several inhalers   Depression    Fever blister    Hematuria    History of kidney stones    Hypertension    Hypoglycemia    Left flank pain    Migraine    Pre-diabetes    Restless leg    Yeast vaginitis      Transfusion: none   Consultants (if any):   Discharged Condition: Improved  Hospital Course: Summer Murphy is an 65 y.o. female who was admitted 11/14/2020 with a diagnosis of painful hardware left femur and went to the operating room on 11/14/2020 and underwent the above named procedures.    Surgeries: Procedure(s): HARDWARE REMOVAL on 11/14/2020 Patient tolerated the surgery well. Taken to PACU where she was stabilized and then transferred to the orthopedic floor.  Started on Lovenox 40 mg q 24 hrs. Foot pumps applied bilaterally at 80 mm. Heels elevated on bed with rolled towels. No evidence of DVT. Negative [place]. Physical therapy started on day #1 for gait training and transfer. OT started day #1 for ADL and assisted devices.  Patient's foley was d/c on day #1.  Pain well controlled on postop day 1.  Vital signs and labs are stable.  On post op day #1 patient was stable and ready for discharge to home.    She was given perioperative  antibiotics:  Anti-infectives (From admission, onward)    Start     Dose/Rate Route Frequency Ordered Stop   11/14/20 1800  ceFAZolin (ANCEF) IVPB 2g/100 mL premix        2 g 200 mL/hr over 30 Minutes Intravenous Every 6 hours 11/14/20 1625 11/15/20 0635   11/14/20 1625  valACYclovir (VALTREX) tablet 500 mg        500 mg Oral Daily PRN 11/14/20 1625     11/14/20 1049  ceFAZolin (ANCEF) 2-4 GM/100ML-% IVPB       Note to  Pharmacy: Trudie Reed   : cabinet override      11/14/20 1049 11/14/20 1152   11/14/20 0600  ceFAZolin (ANCEF) IVPB 2g/100 mL premix        2 g 200 mL/hr over 30 Minutes Intravenous On call to O.R. 11/14/20 0036 11/14/20 1159     .  She was given sequential compression devices, early ambulation, and Lovenox, teds for DVT prophylaxis.  She benefited maximally from the hospital stay and there were no complications.    Recent vital signs:  Vitals:   11/15/20 0824 11/15/20 1201  BP: 95/69 (!) 97/51  Pulse: 73 72  Resp: 18 17  Temp: 97.8 F (36.6 C) 98 F (36.7 C)  SpO2: 94% 98%    Recent laboratory studies:  Lab Results  Component Value Date   HGB 11.4 (L) 11/14/2020   HGB 10.5 (L) 10/11/2020   HGB 10.2 (L) 09/16/2020   Lab Results  Component Value Date   WBC 14.4 (H) 11/14/2020   PLT 211 11/14/2020   Lab Results  Component Value Date   INR 0.9 10/17/2013   Lab Results  Component Value Date   NA 139 09/16/2020   K 4.2 09/16/2020   CL 101 09/16/2020   CO2 20 09/16/2020   BUN 14 09/16/2020   CREATININE 0.73 11/14/2020   GLUCOSE 109 (H) 09/16/2020    Discharge Medications:   Allergies as of 11/15/2020       Reactions   Librium [chlordiazepoxide] Shortness Of Breath   Metoclopramide Hives, Other (See Comments)   hallucinations   Vanilla Shortness Of Breath   Aspirin Hives   Nsaids Rash   Rash/flares asthma issues.   Azithromycin Other (See Comments)   Unsure of what the reaction was.   Buprenorphine Hcl Other (See Comments)   Unsure of what the reaction was.   Chlordiazepoxide Hcl Other (See Comments)   unsure   Morphine Other (See Comments)   Unsure of reaction   Sulfa Antibiotics    Other reaction(s): Unknown   Tolmetin    Other Reaction: Allergy   Amlodipine Besylate Itching, Rash   arms, stomach and forehead   Iron Nausea And Vomiting   Other reaction(s): Unknown        Medication List     STOP taking these medications    Biotin 800  MCG Tabs   clotrimazole-betamethasone cream Commonly known as: LOTRISONE   docusate sodium 100 MG capsule Commonly known as: COLACE       TAKE these medications    acetaminophen 500 MG tablet Commonly known as: TYLENOL Take 2 tablets (1,000 mg total) by mouth every 6 (six) hours.   Breo Ellipta 200-25 MCG/INH Aepb Generic drug: fluticasone furoate-vilanterol Inhale 1 puff into the lungs daily.   Brexpiprazole 4 MG Tabs Take 4 mg by mouth daily.   busPIRone 10 MG tablet Commonly known as: BUSPAR Take 10 mg by mouth 2 (two) times daily.  clonazePAM 0.5 MG tablet Commonly known as: KLONOPIN Take 0.5 mg by mouth at bedtime.   diclofenac Sodium 1 % Gel Commonly known as: VOLTAREN Apply 4 g topically 4 (four) times daily. What changed: when to take this   diltiazem 180 MG 24 hr capsule Commonly known as: DILACOR XR Take 1 capsule (180 mg total) by mouth daily.   diphenhydrAMINE 25 MG tablet Commonly known as: BENADRYL Take 50 mg by mouth 2 (two) times daily as needed for allergies.   diphenoxylate-atropine 2.5-0.025 MG tablet Commonly known as: Lomotil Take 1 tablet by mouth 3 (three) times daily as needed for diarrhea or loose stools.   DULoxetine 60 MG capsule Commonly known as: CYMBALTA Take 60 mg by mouth at bedtime.   enoxaparin 40 MG/0.4ML injection Commonly known as: LOVENOX Inject 0.4 mLs (40 mg total) into the skin daily for 7 days.   fluticasone 50 MCG/ACT nasal spray Commonly known as: FLONASE Place 2 sprays into both nostrils daily.   gabapentin 600 MG tablet Commonly known as: NEURONTIN TAKE 1 TABLET BY MOUTH TWICE DAILY   ipratropium-albuterol 0.5-2.5 (3) MG/3ML Soln Commonly known as: DUONEB Take 3 mLs by nebulization every 4 (four) hours as needed (for shortness of breath/wheezing). What changed: Another medication with the same name was changed. Make sure you understand how and when to take each.   Combivent Respimat 20-100 MCG/ACT  Aers respimat Generic drug: Ipratropium-Albuterol INHALE 1 PUFF INTO THE LUNGS EVERY 6 HOURS What changed: See the new instructions.   loratadine 10 MG tablet Commonly known as: CLARITIN Take 1 tablet (10 mg total) by mouth daily.   metoprolol tartrate 25 MG tablet Commonly known as: LOPRESSOR Take 1 tablet (25 mg total) by mouth 2 (two) times daily.   montelukast 10 MG tablet Commonly known as: SINGULAIR Take 1 tablet (10 mg total) by mouth at bedtime.   nystatin 100000 UNIT/ML suspension Commonly known as: MYCOSTATIN Take 5 mLs (500,000 Units total) by mouth 4 (four) times daily.   ondansetron 4 MG tablet Commonly known as: ZOFRAN Take 1 tablet (4 mg total) by mouth every 6 (six) hours as needed for nausea.   oxybutynin 5 MG tablet Commonly known as: DITROPAN Take 1 tablet (5 mg total) by mouth in the morning and at bedtime.   oxyCODONE 5 MG immediate release tablet Commonly known as: Oxy IR/ROXICODONE Take 1-2 tablets (5-10 mg total) by mouth every 4 (four) hours as needed for moderate pain (pain score 4-6).   pantoprazole 40 MG tablet Commonly known as: PROTONIX Take 40 mg by mouth 2 (two) times daily.   rOPINIRole 0.5 MG tablet Commonly known as: REQUIP Take two tablets po BID for restless legs.   sennosides-docusate sodium 8.6-50 MG tablet Commonly known as: SENOKOT-S Take 2 tablets by mouth daily.   SUMAtriptan 100 MG tablet Commonly known as: IMITREX Take 1 tablet (100 mg total) by mouth every 2 (two) hours as needed (for migraine headaches.). May repeat in 1 hours if headache persists or recurs.   traZODone 150 MG tablet Commonly known as: DESYREL Take 150 mg by mouth at bedtime.   valACYclovir 500 MG tablet Commonly known as: VALTREX Take 1 tablet (500 mg total) by mouth daily as needed (for fever blisters).         Diagnostic Studies: DG C-Arm 1-60 Min  Result Date: 11/14/2020 CLINICAL DATA:  Hardware removal. EXAM: DG C-ARM 1-60 MIN; RIGHT  FEMUR 2 VIEWS FLUOROSCOPY TIME:  Fluoroscopy Time:  1 minute and  6 seconds. Radiation Exposure Index (if provided by the fluoroscopic device): 12 mGy. Number of Acquired Spot Images: 1 COMPARISON:  None. FINDINGS: A single C-arm fluoroscopic image was obtained intraoperatively and submitted for post operative interpretation. This image demonstrates total knee arthroplasty with healed distal femur fracture. Per operative note, multiple screws where removed. Please see the performing provider's procedural report for further detail. IMPRESSION: Intraoperative fluoroscopy, as detailed above. Electronically Signed   By: Margaretha Sheffield M.D.   On: 11/14/2020 13:59   DG FEMUR, MIN 2 VIEWS RIGHT  Result Date: 11/14/2020 CLINICAL DATA:  Hardware removal. EXAM: DG C-ARM 1-60 MIN; RIGHT FEMUR 2 VIEWS FLUOROSCOPY TIME:  Fluoroscopy Time:  1 minute and 6 seconds. Radiation Exposure Index (if provided by the fluoroscopic device): 12 mGy. Number of Acquired Spot Images: 1 COMPARISON:  None. FINDINGS: A single C-arm fluoroscopic image was obtained intraoperatively and submitted for post operative interpretation. This image demonstrates total knee arthroplasty with healed distal femur fracture. Per operative note, multiple screws where removed. Please see the performing provider's procedural report for further detail. IMPRESSION: Intraoperative fluoroscopy, as detailed above. Electronically Signed   By: Margaretha Sheffield M.D.   On: 11/14/2020 13:59    Disposition: home with home health PT     Follow-up Information     Duanne Guess, PA-C Follow up in 2 week(s).   Specialties: Orthopedic Surgery, Emergency Medicine Contact information: [place] [address] [phone]                  Signed: Fausto Skillern 11/15/2020, 2:10 PM

## 2020-11-20 ENCOUNTER — Telehealth: Payer: Self-pay | Admitting: Pharmacist

## 2020-11-20 DIAGNOSIS — I10 Essential (primary) hypertension: Secondary | ICD-10-CM | POA: Diagnosis not present

## 2020-11-20 DIAGNOSIS — J449 Chronic obstructive pulmonary disease, unspecified: Secondary | ICD-10-CM | POA: Diagnosis not present

## 2020-11-20 NOTE — Chronic Care Management (AMB) (Signed)
Chronic Care Management   Outreach Note  11/20/2020 Name: Summer Murphy MRN: [id number] DOB: [birth year]  Referred by: Lavera Guise, MD Reason for referral : No chief complaint on file.    [place]  Name: Summer Murphy  Date of Birth: [birth year]  Gender: Female  Primary provider: Clayborn Bigness  RPM Coordinator: Charlann Lange  Systolic BP reading is [id number] for 0 % of time  Systolic BP reading is between 160 to 179 for 0 % of time  Systolic BP reading is between 140 to 159 for 43.33 % of time  Systolic BP reading is between 120-139 for 46.67 % of time  Systolic BP reading is between 101-120 for 10.0 % of time  Systolic BP reading is 99991111 for 0 % of time  DIASTOLIC BP  Diastolic BP reading is elevated >100 for 0 % of time  Diastolic BP reading is between 90-99 for 20.0 % of time  PULSE  Pulse is >120 for 0 % of time  Pulse is <50 for 0 % of time  ICD Code: I10 Device ID: [id number] [place]: iBP Measurement Type: blood pressure Days of Monitoring: 25 Documentation Time: 1 Hr 5 Min 0 Sec BLOOD PRESSURE  Measurement Date: 11/20/2020 Wednesday at Q000111Q AM Systolic / Diastolic (mmHg): XX123456 / 80 Measurement Date: 11/18/2020 Monday at 123456 AM Systolic / Diastolic (mmHg): A999333 / 86 Measurement Date: 11/17/2020 Sunday at [id number] PM Systolic / Diastolic (mmHg): A999333 / 77 Measurement Date: 11/17/2020 Sunday at A999333 AM Systolic / Diastolic (mmHg): 123456 / 93 Measurement Date: 11/16/2020 Saturday at 123XX123 AM Systolic / Diastolic (mmHg): 99991111 / 63 Measurement Date: 11/14/2020 Thursday at Q000111Q AM Systolic / Diastolic (mmHg): 0000000 / 75 Measurement Date: 11/13/2020 Wednesday at 123XX123 PM Systolic / Diastolic (mmHg): 123456 / 83 Measurement Date: 11/12/2020 Tuesday at A999333 AM Systolic / Diastolic (mmHg): A999333 / 78 Measurement Date: 11/11/2020 Monday at Q000111Q PM Systolic / Diastolic (mmHg): 123456 / 82 Measurement Date: 11/10/2020 Sunday at Q000111Q AM Systolic / Diastolic  (mmHg): Q000111Q / 95 Measurement Date: 11/08/2020 Friday at 0000000 AM Systolic / Diastolic (mmHg): 0000000 / 90 Measurement Date: 11/06/2020 Wednesday at XX123456 PM Systolic / Diastolic (mmHg): 123XX123 / 76 Measurement Date: 11/04/2020 Monday at XX123456 PM Systolic / Diastolic (mmHg): 123456 / 87 Measurement Date: 11/03/2020 Sunday at 123456 PM Systolic / Diastolic (mmHg): 123456 / 91 Measurement Date: 11/02/2020 Saturday at Q000111Q AM Systolic / Diastolic (mmHg): A999333 / 85 Measurement Date: 11/01/2020 Friday at 123456 PM Systolic / Diastolic (mmHg): A999333 / 81 Measurement Date: 11/01/2020 Friday at 123456 PM Systolic / Diastolic (mmHg): A999333 / 75 Measurement Date: 10/31/2020 Thursday at 99991111 PM Systolic / Diastolic (mmHg): Q000111Q / 77 Measurement Date: 10/30/2020 Wednesday at XX123456 PM Systolic / Diastolic (mmHg): 123456 / 65 Measurement Date: 10/29/2020 Tuesday at 0000000 AM Systolic / Diastolic (mmHg): [id number]-123456789 / 77 Measurement Date: 10/28/2020 Monday at 0000000 PM Systolic / Diastolic (mmHg): 123456 / 77 Measurement Date: 10/27/2020 Sunday at 123XX123 PM Systolic / Diastolic (mmHg): 123456 / 73 Measurement Date: 10/27/2020 Sunday at A999333 AM Systolic / Diastolic (mmHg): Q000111Q / 83 Measurement Date: 10/26/2020 Saturday at 123456 AM Systolic / Diastolic (mmHg): 123456 / 69 Measurement Date: 10/25/2020 Friday at A999333 PM Systolic / Diastolic (mmHg): [id number] / 76 Measurement Date: 10/25/2020 Friday at 99991111 PM Systolic / Diastolic (mmHg): Q000111Q / 94 Measurement Date: 10/24/2020 Thursday at 123456 PM Systolic / Diastolic (mmHg): A999333 / 77 Measurement Date: 10/23/2020 Wednesday at XX123456 PM Systolic / Diastolic (mmHg): 123XX123 /  68 Measurement Date: 10/23/2020 Wednesday at 0000000 PM Systolic / Diastolic (mmHg): 0000000 / 92 Measurement Date: 10/21/2020 Monday at [id number] PM Systolic / Diastolic (mmHg): Q000111Q / 80  PULSE  Measurement Date: 11/20/2020 Wednesday at 11:28 AM Pulse (IN BPM): 108 Measurement Date: 11/18/2020 Monday at 10:00 AM Pulse (IN BPM): 101 Measurement Date:  11/17/2020 Sunday at 07:32 PM Pulse (IN BPM): 101 Measurement Date: 11/17/2020 Sunday at 09:13 AM Pulse (IN BPM): 105 Measurement Date: 11/16/2020 Saturday at 11:11 AM Pulse (IN BPM): 102 Measurement Date: 11/14/2020 Thursday at 07:21 AM Pulse (IN BPM): 106 Measurement Date: 11/13/2020 Wednesday at 12:26 PM Pulse (IN BPM): 110 Measurement Date: 11/12/2020 Tuesday at 09:18 AM Pulse (IN BPM): 108 Measurement Date: 11/11/2020 Monday at 07:20 PM Pulse (IN BPM): 102 Measurement Date: 11/10/2020 Sunday at 09:07 AM Pulse (IN BPM): 105 Measurement Date: 11/08/2020 Friday at 10:20 AM Pulse (IN BPM): 104 Measurement Date: 11/06/2020 Wednesday at 03:31 PM Pulse (IN BPM): 115 Measurement Date: 11/04/2020 Monday at 04:26 PM Pulse (IN BPM): 93 Measurement Date: 11/03/2020 Sunday at 09:21 PM Pulse (IN BPM): 99 Measurement Date: 11/02/2020 Saturday at 11:27 AM Pulse (IN BPM): 96 Measurement Date: 11/01/2020 Friday at 07:17 PM Pulse (IN BPM): 85 Measurement Date: 11/01/2020 Friday at 12:51 PM Pulse (IN BPM): 105 Measurement Date: 10/31/2020 Thursday at 05:27 PM Pulse (IN BPM): 111 Measurement Date: 10/30/2020 Wednesday at 12:22 PM Pulse (IN BPM): 92 Measurement Date: 10/29/2020 Tuesday at 11:32 AM Pulse (IN BPM): 81 Measurement Date: 10/28/2020 Monday at 07:48 PM Pulse (IN BPM): 72 Measurement Date: 10/27/2020 Sunday at 07:02 PM Pulse (IN BPM): 81 Measurement Date: 10/27/2020 Sunday at 08:41 AM Pulse (IN BPM): 87 Measurement Date: 10/26/2020 Saturday at 11:20 AM Pulse (IN BPM): 72 Measurement Date: 10/25/2020 Friday at 09:46 PM Pulse (IN BPM): 73 Measurement Date: 10/25/2020 Friday at 03:43 PM Pulse (IN BPM): 80 Measurement Date: 10/24/2020 Thursday at 08:17 PM Pulse (IN BPM): 100 Measurement Date: 10/23/2020 Wednesday at 11:38 PM Pulse (IN BPM): 74 Measurement Date: 10/23/2020 Wednesday at 07:40 PM Pulse (IN BPM): 85 Measurement Date: 10/21/2020 Monday at 04:24 PM Pulse (IN BPM): 80        Notes  for August-2022 : 1 Hr 5 Min 0 Sec  Date and Time: 11/20/2020 Wednesday at 03:38 PM User: Leata Mouse Time logged: 05:00 Notes: DATA REVIEW SYSTOLIC BP  Automatically transmitted data is reviewed today from device. (ID: [id number]).  Systolic BP reading is XX123456 for 0 % of time  Systolic BP reading is between 160 to 179 for 0 % of time  Systolic BP reading is between 140 to 159 for 43.33 % of time  Systolic BP reading is between 120-139 for 46.67 % of time  Systolic BP reading is between 101-120 for 10.0 % of time  Systolic BP reading is 99991111 for 0 % of time  DIASTOLIC BP  Diastolic BP reading is elevated >100 for 0 % of time  Diastolic BP reading is between 90-99 for 20.0 % of time  PULSE  Pulse is >120 for 0 % of time  Pulse is <50 for 0 % of time  COMPLIANCE NOTES BLOOD PRESSURE  Patient is compliant - 16 days of readings obtained.  MANAGEMENT NOTES We will continue to monitor BP readings for now  Medications for BP reviewed for this patient  No change on the treatment plan   Date and Time: 11/19/2020 Tuesday at 10:32 AM User: Charlann Lange Time logged: 12:00 Notes: COMPLIANCE NOTES BLOOD PRESSURE  Patient is compliant -  16 days of readings obtained.  REVIEW NOTES All the daily Blood pressure data noted since last review  Heart rate/Pulse data reviewed   Date and Time: 11/12/2020 Tuesday at 03:20 PM User: Charlann Lange Time logged: 02:00 Notes: REVIEW NOTES All the daily Blood pressure data noted since last review  Heart rate/Pulse data reviewed   Date and Time: 11/11/2020 Monday at 04:31 PM User: Charlann Lange Time logged: 04:00 Notes: REVIEW NOTES All the daily Blood pressure data noted since last review  Heart rate/Pulse data reviewed   Date and Time: 11/08/2020 Friday at 04:33 PM User: Charlann Lange Time logged: 02:00 Notes: REVIEW NOTES All the daily Blood pressure data noted since last review  Heart  rate/Pulse data reviewed   Date and Time: 11/06/2020 Wednesday at 04:01 PM User: Charlann Lange Time logged: 02:00 Notes: REVIEW NOTES All the daily Blood pressure data noted since last review  Heart rate/Pulse data reviewed   Date and Time: 11/04/2020 Monday at 04:20 PM User: Charlann Lange Time logged: 04:00 Notes: REVIEW NOTES All the daily Blood pressure data noted since last review  Heart rate/Pulse data reviewed   Date and Time: 11/01/2020 Friday at 03:31 PM User: Charlann Lange Time logged: 02:00 Notes: REVIEW NOTES All the daily Blood pressure data noted since last review  Heart rate/Pulse data reviewed   Date and Time: 10/30/2020 Wednesday at 03:59 PM User: Charlann Lange Time logged: 02:00 Notes: REVIEW NOTES All the daily Blood pressure data noted since last review  Heart rate/Pulse data reviewed   Date and Time: 10/29/2020 Tuesday at 01:45 PM User: Charlann Lange Time logged: 02:00 Notes: REVIEW NOTES All the daily Blood pressure data noted since last review  Heart rate/Pulse data reviewed   Date and Time: 10/28/2020 Monday at 02:52 PM User: Charlann Lange Time logged: 10:00 Notes: REVIEW NOTES All the daily Blood pressure data noted since last review  Heart rate/Pulse data reviewed   Date and Time: 10/24/2020 Thursday at 08:17 AM User: Charlann Lange Time logged: 04:00 Notes: REVIEW NOTES All the daily Blood pressure data noted since last review  Heart rate/Pulse data reviewed   Date and Time: 10/22/2020 Tuesday at 02:40 PM User: Charlann Lange Time logged: 02:00 Notes: REVIEW NOTES All the daily Blood pressure data noted since last review  Heart rate/Pulse data reviewed   Date and Time: 10/21/2020 Monday at 03:31 PM User: Charlann Lange Time logged: 12:00 Notes: REVIEW NOTES All the daily Blood pressure data noted since last review  Heart rate/Pulse data reviewed  Verbal Consent  Obtained on August 23, 2020. Patient was notified about Remote Patient Monitoring (RPM) program offered by our medical practice to improve the patient care, co-ordination of care, patient education, provide quality of care, reduce the cost and provide personalized care. Patient was informed that this is Medicare approved program and can be furnished by only one provider, can be stopped by the patient anytime. Patient has verbally consented to enroll in this program and our designated care coordinator to contact by phone as needed at least for 20 minutes a month. Practice also provides 24/7 care to the patient needs. All patient questions were answered and patient was enrolled in the RPM program.

## 2020-11-26 ENCOUNTER — Ambulatory Visit (INDEPENDENT_AMBULATORY_CARE_PROVIDER_SITE_OTHER): Payer: Medicare Other | Admitting: Internal Medicine

## 2020-11-26 ENCOUNTER — Encounter: Payer: Self-pay | Admitting: Internal Medicine

## 2020-11-26 ENCOUNTER — Other Ambulatory Visit: Payer: Self-pay

## 2020-11-26 VITALS — BP 132/74 | HR 86 | Temp 98.6°F | Resp 16 | Ht 63.0 in | Wt 169.8 lb

## 2020-11-26 DIAGNOSIS — J301 Allergic rhinitis due to pollen: Secondary | ICD-10-CM

## 2020-11-26 NOTE — Progress Notes (Signed)
Pt signed allergy test consent form before allergy test was performed.  Pt completed allergy testing and was positive for several allergies.  DSK explained to pt on the allergic reaction to certain allergens and informed pt that she could benefit with allergy injections.  I gave patient the consent form to agree for allergy injections to look over and went and had Dan Europe go in room with pt and explain how benefits worked and pt was told she will be contacted once benefits were checked and Tat will let me know when benefits are completed

## 2020-11-26 NOTE — Procedures (Signed)
    [facility] Allergy MQT Recording Form  [place] [address] [place], [place] [address] Phone [phone] Fax [phone]   Patient Name: Summer Murphy Age: 65 y.o. Sex: female Date of Service: 11/26/2020   Performing Provider: Allyne Gee MD [facility]         Battery A Back   Site Antigen [facility] Flare  A1 Positive Control 5 7  A2 Negative Control 4 5  A3 American Elm 0 0  A4 Maple Box Elder 0 0  A5 Grass Mix 7 10  A6 Dock Sorrel Mix 0 0  A7 Russian Thistle 00 0  A8 Ragweed Mix 0 0  A9 English Pantain 00 0  A10 Oak Mix  0 0   Battery B Wheal Flare  B1 Lambs Quarters 0 0  B2 Cottonwood 0 0  B3 Pigweed Mix 0 0  B4 Acacia 0 0  B5 Pine Mix 0 0  B6 Privet 0 0  B7 White/Red Mulberry 0 0  B8 Western Water Hemp 0 0  B9 Guatemala Grass 10 20  B10 Melalucea 0 0   Battery C Wheal Flare  C1 Red River Birch 0 0  C2 Eastern Sycamore 0 0  C3 Bahai Grass 0 0  C4 American Beech 0 0  C5 Ash Mix 0 0  C6 Black Willow 0 0  C7 Hickory 0 0  C8 Black Walnut 0 0  C9 Red Cedar 0 0  C10 Sweet Gum  0 0   Battery D Wheal Flare  D1 Cultivated Oat 10 14  D2 Dog Fennel 0 0  D3 Common Mugwort 0 0  D4 Marsh Elder 0 0  D5 Johnson 0 0  D6 Hackberry Tree 0 0  D7 Bayberry Tree 0 0  D8 Cypress, Bald Tree 0 0  D9 Aspergillus Fumigatus 0 0  D10 Alternia  0 0   Battery E Wheal Flare  E1 Dreschlere 0 0  E2 Fusarium Mix 0 0  E3 Cladosporum Sph 0 0  E4 Bipolaris 0 0  E5 Penicillin chrys 0 0  E6 Cladosporum Herb 0 0  E7 Candida 0 0  E8 Aureobasidium 0 0  E9 Rhizopus 0 0  E10 Botrytis  0 0   Battery F Wheal Flare  F1 Aspergillus [place] 12 24  F2 Dust Mite Mix 7 20  F3 Cockroach Mix 7 11  F4 Cat Hair 7 14  F5 Dog Mixed breeds 0 0  F6 Feather Mix 0 0

## 2020-11-27 ENCOUNTER — Telehealth: Payer: Self-pay

## 2020-11-27 NOTE — Telephone Encounter (Unsigned)
Benefits checked for allergy inj + serum. No auth/100% coverage if patient has a Environmental consultant ( medicaid)/ no copay/no coins/ 20% coins if pt does not have medicaid/ 7550 oop/ 0 met/ no ded.tat   ***Pt will not a have copay for allergy injections** Benefits should be rechecked the beginning of the calendar year. tat

## 2020-12-01 ENCOUNTER — Other Ambulatory Visit: Payer: Self-pay | Admitting: Internal Medicine

## 2020-12-01 DIAGNOSIS — I10 Essential (primary) hypertension: Secondary | ICD-10-CM

## 2020-12-13 DIAGNOSIS — J449 Chronic obstructive pulmonary disease, unspecified: Secondary | ICD-10-CM | POA: Diagnosis not present

## 2020-12-13 DIAGNOSIS — M978XXD Periprosthetic fracture around other internal prosthetic joint, subsequent encounter: Secondary | ICD-10-CM | POA: Diagnosis not present

## 2020-12-13 DIAGNOSIS — M6281 Muscle weakness (generalized): Secondary | ICD-10-CM | POA: Diagnosis not present

## 2020-12-13 NOTE — Progress Notes (Deleted)
Chronic Care Management Pharmacy Note  12/13/2020 Name:  Summer Murphy MRN:  [id number] DOB:  [birth year]  Subjective: Summer Murphy is an 65 y.o. year old female who is a primary patient of [name], MD.  The CCM team was consulted for assistance with disease management and care coordination needs.    Engaged with patient face to face for initial visit in response to provider referral for pharmacy case management and/or care coordination services.   Consent to Services:  The patient was given the following information about Chronic Care Management services today, agreed to services, and gave verbal consent: 1. CCM service includes personalized support from designated clinical staff supervised by the primary care provider, including individualized plan of care and coordination with other care providers 2. 24/7 contact phone numbers for assistance for urgent and routine care needs. 3. Service will only be billed when office clinical staff spend 20 minutes or more in a month to coordinate care. 4. Only one practitioner may furnish and bill the service in a calendar month. 5.The patient may stop CCM services at any time (effective at the end of the month) by phone call to the office staff. 6. The patient will be responsible for cost sharing (co-pay) of up to 20% of the service fee (after annual deductible is met). Patient agreed to services and consent obtained.  Patient Care Team: Lavera Guise, MD as PCP - General (Internal Medicine) Edythe Clarity, [facility] as Pharmacist (Pharmacist)  Recent office visits:  None since 07/04/20   Recent consult visits:  07/24/20 Rosario Adie, Lourena Simmonds, MD For Barrett's esophagus/EGD. No medication changes.   Hospital visits:  None since 07/04/20  Objective:  Lab Results  Component Value Date   CREATININE 0.73 11/14/2020   BUN 14 09/16/2020   GFRNONAA >60 11/14/2020   GFRAA >60 11/16/2019   NA 139 09/16/2020   K 4.2 09/16/2020   CALCIUM  9.4 09/16/2020   CO2 20 09/16/2020   GLUCOSE 109 (H) 09/16/2020    Lab Results  Component Value Date/Time   HGBA1C 5.9 (A) 08/08/2019 02:59 PM   HGBA1C 5.9 (A) 12/14/2018 11:50 AM   HGBA1C 6.1 (H) 01/20/2016 01:15 PM    Last diabetic Eye exam: No results found for: HMDIABEYEEXA  Last diabetic Foot exam: No results found for: HMDIABFOOTEX   Lab Results  Component Value Date   CHOL 166 10/11/2020   HDL 63 10/11/2020   LDLCALC 88 10/11/2020   TRIG 81 10/11/2020    Hepatic Function Latest Ref Rng & Units 09/16/2020 09/23/2019 09/28/2018  Total Protein 6.0 - 8.5 g/dL 6.4 6.6 6.2(L)  Albumin 3.8 - 4.8 g/dL 4.4 3.8 3.8  AST 0 - 40 IU/L 19 34 25  ALT 0 - 32 IU/L 15 32 28  Alk Phosphatase 44 - 121 IU/L 122(H) 78 88  Total Bilirubin 0.0 - 1.2 mg/dL <0.2 0.6 0.3  Bilirubin, Direct 0.0 - 0.2 mg/dL - 0.1 -    Lab Results  Component Value Date/Time   TSH 2.000 09/16/2020 11:19 AM   FREET4 1.19 09/16/2020 11:19 AM    CBC Latest Ref Rng & Units 11/14/2020 10/11/2020 09/16/2020  WBC 4.0 - 10.5 K/uL 14.4(H) 6.6 6.1  Hemoglobin 12.0 - 15.0 g/dL 11.4(L) 10.5(L) 10.2(L)  Hematocrit 36.0 - 46.0 % 35.8(L) 34.0 31.5(L)  Platelets 150 - 400 K/uL 211 300 352    No results found for: VD25OH  Clinical ASCVD: No  The 10-year ASCVD risk score (Arnett DK,  et al., 2019) is: 10.3%   Values used to calculate the score:     Age: 10 years     Sex: Female     Is Non-Hispanic African American: No     Diabetic: Yes     Tobacco smoker: No     Systolic Blood Pressure: 093 mmHg     Is BP treated: Yes     HDL Cholesterol: 63 mg/dL     Total Cholesterol: 166 mg/dL    Depression screen [facility] 2/9 10/15/2020 10/08/2020 09/16/2020  Decreased Interest 0 0 0  Down, Depressed, Hopeless 0 0 0  PHQ - 2 Score 0 0 0  Altered sleeping - - -  Tired, decreased energy - - -  Change in appetite - - -  Feeling bad or failure about yourself  - - -  Trouble concentrating - - -  Moving slowly or fidgety/restless - - -   Suicidal thoughts - - -  PHQ-9 Score - - -  Difficult doing work/chores - - -  Some recent data might be hidden      Social History   Tobacco Use  Smoking Status Never  Smokeless Tobacco Never   BP Readings from Last 3 Encounters:  11/26/20 132/74  11/15/20 (!) 94/51  11/11/20 122/65   Pulse Readings from Last 3 Encounters:  11/26/20 86  11/15/20 68  11/11/20 88   Wt Readings from Last 3 Encounters:  11/26/20 169 lb 12.8 oz (77 kg)  11/04/20 170 lb (77.1 kg)  10/18/20 166 lb 11.2 oz (75.6 kg)   BMI Readings from Last 3 Encounters:  11/26/20 30.08 kg/m  11/04/20 30.11 kg/m  10/18/20 29.53 kg/m    Assessment/Interventions: Review of patient past medical history, allergies, medications, health status, including review of consultants reports, laboratory and other test data, was performed as part of comprehensive evaluation and provision of chronic care management services.   SDOH:  (Social Determinants of Health) assessments and interventions performed: Yes   Financial Resource Strain: Not on file    SDOH Screenings   Alcohol Screen: Low Risk    Last Alcohol Screening Score (AUDIT): 0  Depression (PHQ2-9): Low Risk    PHQ-2 Score: 0  Financial Resource Strain: Not on file  Food Insecurity: Not on file  Housing: Not on file  Physical Activity: Not on file  Social Connections: Not on file  Stress: Not on file  Tobacco Use: Low Risk    Smoking Tobacco Use: Never   Smokeless Tobacco Use: Never  Transportation Needs: Not on file    CCM Care Plan  Allergies  Allergen Reactions   Librium [Chlordiazepoxide] Shortness Of Breath   Metoclopramide Hives and Other (See Comments)    hallucinations    Vanilla Shortness Of Breath   Aspirin Hives   Nsaids Rash    Rash/flares asthma issues.   Azithromycin Other (See Comments)    Unsure of what the reaction was.   Buprenorphine Hcl Other (See Comments)    Unsure of what the reaction was.    Chlordiazepoxide  Hcl Other (See Comments)    unsure   Morphine Other (See Comments)    Unsure of reaction   Sulfa Antibiotics     Other reaction(s): Unknown   Tolmetin     Other Reaction: Allergy   Amlodipine Besylate Itching and Rash    arms, stomach and forehead   Iron Nausea And Vomiting    Other reaction(s): Unknown    Medications Reviewed Today  Reviewed by Edd Arbour, [place] (Certified Medical Assistant) on 11/26/20 at 1410  Med List Status: <None>   Medication Order Taking? Sig Documenting Provider Last Dose Status Informant  acetaminophen (TYLENOL) 500 MG tablet [id number]  Take 2 tablets (1,000 mg total) by mouth every 6 (six) hours. Renata Caprice  Active   Brexpiprazole 4 MG TABS [id number] No Take 4 mg by mouth daily. [provider] 11/13/2020 Active Self  busPIRone (BUSPAR) 10 MG tablet [id number] No Take 10 mg by mouth 2 (two) times daily. [provider] 11/14/2020 Active Self  clonazePAM (KLONOPIN) 0.5 MG tablet [id number] No Take 0.5 mg by mouth at bedtime. [provider] 11/13/2020 Active Self  COMBIVENT RESPIMAT 20-100 MCG/ACT AERS respimat [id number] No INHALE 1 PUFF INTO THE LUNGS EVERY 6 HOURS  Patient taking differently: Inhale 1 puff into the lungs every 6 (six) hours as needed (COPD).   Lavera Guise, MD 11/13/2020 Active   diclofenac Sodium (VOLTAREN) 1 % GEL [id number] No Apply 4 g topically 4 (four) times daily.  Patient taking differently: Apply 4 g topically 2 (two) times daily.   Lavera Guise, MD 11/13/2020 Active   diltiazem (DILACOR XR) 180 MG 24 hr capsule [id number] No Take 1 capsule (180 mg total) by mouth daily. Lavera Guise, MD 11/14/2020 Active Self  diphenhydrAMINE (BENADRYL) 25 MG tablet [id number] No Take 50 mg by mouth 2 (two) times daily as needed for allergies. [provider] 11/13/2020 Active Self  diphenoxylate-atropine (LOMOTIL) 2.5-0.025 MG tablet [id number] No Take 1 tablet by mouth 3 (three) times daily as needed for  diarrhea or loose stools. Ronnell Freshwater, NP 11/13/2020 Active Self  DULoxetine (CYMBALTA) 60 MG capsule [id number] No Take 60 mg by mouth at bedtime. [provider] 11/13/2020 Active Self  enoxaparin (LOVENOX) 40 MG/0.4ML injection [id number]  Inject 0.4 mLs (40 mg total) into the skin daily for 7 days. Duanne Guess, PA-C  Expired 11/22/20 2359   fluticasone (FLONASE) 50 MCG/ACT nasal spray [id number] No Place 2 sprays into both nostrils daily. Lavera Guise, MD 11/14/2020 Active Self  fluticasone furoate-vilanterol (BREO ELLIPTA) 200-25 MCG/INH AEPB [id number] No Inhale 1 puff into the lungs daily. Lavera Guise, MD 11/13/2020 Active Self  gabapentin (NEURONTIN) 600 MG tablet [id number] No TAKE 1 TABLET BY MOUTH TWICE DAILY Lavera Guise, MD 11/14/2020 Active Self  ipratropium-albuterol (DUONEB) 0.5-2.5 (3) MG/3ML SOLN [id number] No Take 3 mLs by nebulization every 4 (four) hours as needed (for shortness of breath/wheezing). Kendell Bane, NP 11/13/2020 Active Self  loratadine (CLARITIN) 10 MG tablet [id number] No Take 1 tablet (10 mg total) by mouth daily. Luiz Ochoa, NP 11/14/2020 Active Self  metoprolol tartrate (LOPRESSOR) 25 MG tablet [id number] No Take 1 tablet (25 mg total) by mouth 2 (two) times daily. Lavera Guise, MD 11/14/2020 Active Self  montelukast (SINGULAIR) 10 MG tablet [id number] No Take 1 tablet (10 mg total) by mouth at bedtime. Lavera Guise, MD 11/13/2020 Active Self  nystatin (MYCOSTATIN) 100000 UNIT/ML suspension [id number] No Take 5 mLs (500,000 Units total) by mouth 4 (four) times daily. Luiz Ochoa, NP 11/13/2020 Active Self  ondansetron (ZOFRAN) 4 MG tablet [id number] No Take 1 tablet (4 mg total) by mouth every 6 (six) hours as needed for nausea. Duanne Guess, PA-C 11/13/2020 Active Self  oxybutynin (DITROPAN) 5 MG tablet [id number] No Take 1 tablet (5 mg total) by mouth in the morning and at bedtime. Lavera Guise, MD 11/14/2020  Active Self  oxyCODONE (OXY  IR/ROXICODONE) 5 MG immediate release tablet [id number]  Take 1-2 tablets (5-10 mg total) by mouth every 4 (four) hours as needed for moderate pain (pain score 4-6). Duanne Guess, PA-C  Active   pantoprazole (PROTONIX) 40 MG tablet [id number] No Take 40 mg by mouth 2 (two) times daily. [provider] 11/14/2020 Active Self  rOPINIRole (REQUIP) 0.5 MG tablet [id number] No Take two tablets po BID for restless legs. Lavera Guise, MD 11/13/2020 Active Self  sennosides-docusate sodium (SENOKOT-S) 8.6-50 MG tablet [id number]  Take 2 tablets by mouth daily. Duanne Guess, PA-C  Active   SUMAtriptan (IMITREX) 100 MG tablet [id number] No Take 1 tablet (100 mg total) by mouth every 2 (two) hours as needed (for migraine headaches.). May repeat in 1 hours if headache persists or recurs. Lavera Guise, MD 11/13/2020 Active Self  traZODone (DESYREL) 150 MG tablet [id number] No Take 150 mg by mouth at bedtime. [provider] 11/13/2020 Active Self  valACYclovir (VALTREX) 500 MG tablet [id number] No Take 1 tablet (500 mg total) by mouth daily as needed (for fever blisters). Lavera Guise, MD 11/13/2020 Active Self            Patient Active Problem List   Diagnosis Date Noted   S/P hardware removal 11/14/2020   IDA (iron deficiency anemia) 10/18/2020   Moderate persistent asthma with acute exacerbation 08/21/2020   S/P revision of total knee, right 11/14/2019   Seasonal allergic rhinitis due to pollen 09/20/2019   Callus of foot 08/13/2019   Candidal vaginitis 08/13/2019   Other symptoms and signs involving the nervous system 08/13/2019   Encounter for general adult medical examination with abnormal findings 08/13/2019   Restless leg syndrome 04/30/2019   Type 2 diabetes mellitus with hyperglycemia ([place]) 04/02/2019   Muscle cramps 24/23/5361   Diastolic dysfunction 44/31/5400   SOB (shortness of breath) 12/14/2018   Hiatal hernia with GERD 12/14/2018   Wheezing 12/14/2018   Impaired  fasting glucose 12/14/2018   COPD with acute exacerbation ([place]) 09/28/2018   Acute upper respiratory infection 08/25/2018   Recurrent sinusitis 08/15/2018   Prediabetes 08/15/2018   Nonintractable headache 08/15/2018   Dysuria 08/15/2018   Intractable migraine without status migrainosus 04/22/2018   Nausea 04/22/2018   Polyneuropathy associated with underlying disease ([place]) 04/22/2018   Midline low back pain without sciatica 04/22/2018   Routine cervical smear 04/22/2018   Essential hypertension 07/01/2017   Acute bronchitis with asthma 07/01/2017   Allergic rhinitis 07/01/2017   Moderate asthma without complication 86/76/1950   Severe recurrent major depression without psychotic features ([place]) 09/10/2014   COPD (chronic obstructive pulmonary disease) ([place]) 09/10/2014   Calculi, ureter 04/26/2013   Corneal graft malfunction 08/17/2012   Calculus of kidney 93/26/7124   Renal colic 58/11/9831   Urge incontinence 04/26/2012   Diaphragmatic hernia 10/27/2010   Barrett esophagus 07/07/2010    Immunization History  Administered Date(s) Administered   Influenza Inj Mdck Quad Pf 11/29/2019   Influenza,inj,Quad PF,6+ Mos 01/21/2016, 03/12/2019   Influenza-Unspecified 02/25/2017   Pneumococcal Polysaccharide-23 01/21/2016    Conditions to be addressed/monitored:  HTN, COPD, Diabetes, RLS, Depression, Chronic Pain, Urge Incontinence  There are no care plans that you recently modified to display for this patient.    Medication Assistance: None required.  Patient affirms current coverage meets needs.  Patient's preferred pharmacy is:  [facility], [address] [address] Phone: [phone] Fax: [phone]  [place], [place] - [address] [place] [address] Phone: [phone] Fax: [facility] [place], [address] [place] [address] Phone: [phone] Fax: [phone]  Uses pill box? Yes Pt endorses 100% compliance  We discussed: Benefits of medication synchronization, packaging and delivery as well as enhanced pharmacist oversight with [employer]. Patient decided to: Continue current medication management strategy  Care Plan and Follow Up Patient Decision:  Patient agrees to Care Plan and Follow-up.  Plan: The care management team will reach out to the patient again over the next 120 days.  Beverly Milch, PharmD Clinical Pharmacist [facility] [phone]    Current Barriers:  Unable to independently monitor therapeutic efficacy Does not adhere to prescribed medication regimen  Pharmacist Clinical Goal(s):  Patient will achieve adherence to monitoring guidelines and medication adherence to achieve therapeutic efficacy maintain control of BP as evidenced by RPM  adhere to plan to optimize therapeutic regimen for COPD as evidenced by report of adherence to recommended medication management changes contact provider office for questions/concerns as evidenced notation of same in electronic health record through collaboration with PharmD and provider.   Interventions: 1:1 collaboration with Lavera Guise, MD regarding development and update of comprehensive plan of care as evidenced by provider attestation and co-signature Inter-disciplinary care team collaboration (see longitudinal plan of care) Comprehensive medication review performed; medication list updated in electronic medical record  Hypertension (BP goal <140/90) -Controlled -Current treatment: Diltiazem 187m daily Metoprolol tartrate 255mbid -Medications previously tried: Toprol XL - patient reports not taking tartrate either  -Current home readings: not checking  -Current exercise habits: none currently due to knee pain -Denies  hypotensive/hypertensive symptoms -Educated on BP goals and benefits of medications for prevention of heart attack, stroke and kidney damage; Exercise goal of 150 minutes per week; Importance of home blood pressure monitoring; Symptoms of hypotension and importance of maintaining adequate hydration; -Counseled to monitor BP at home daily, document, and provide log at future appointments -She does not have BP cuff at home, patient has expressed interest in checking blood pressure.  She has not taken metoprolol in quite a while because she was unsure what it was for.  Does report some rapid heart beats when she lays down at night.  I think it would greatly benefit her form having an RPM device at home to check blood pressure.   -Recommended to continue current medication Collaborated with PCP to determine if it is necessary to restart metoprolol at this time.  Will follow up with patient.  Diabetes (A1c goal <6.5%) -Controlled -Current medications: None -Medications previously tried: none noted  -Current home glucose readings fasting glucose: not checking post prandial glucose: not checking -Denies hypoglycemic/hyperglycemic symptoms -Current exercise: non -Educated on A1c and blood sugar goals; Complications of diabetes including kidney damage, retinal damage, and cardiovascular disease; Exercise goal of 150 minutes per week; -Counseled to check feet daily and get yearly eye exams -Recommended continue current management strategies  COPD (Goal: control symptoms and prevent exacerbations) -Controlled -Current treatment  Spiriva 1.2517mact 2 puffs daily Combivent Respimat 20-100m43mct q6h prn Breo Ellipta 200-25mc42mpuff daily -Medications previously tried: none noted    -Exacerbations requiring treatment in last 6 months: none -Patient denies consistent use of maintenance inhaler - "makes her tongue white" -Frequency of rescue inhaler use: as needed -Counseled  on Proper inhaler  technique; Benefits of consistent maintenance inhaler use When to use rescue inhaler Differences between maintenance and rescue inhalers  Rinsing mouth out after use to avoid thrush -Recommended to continue current medication Recommended strict use of maintenance inhaler daily to prevent exacerbations or SOB, patient agreeable to try  RLS (Goal: Minimize symptoms) -Controlled -Current treatment  Ropinirole 0.67m two tablets po BID -Medications previously tried: none noted -Denies symptoms when taking this medicaiton  -Recommended to continue current medication  Urge Incontinence (Goal: Control bladder) -Not ideally controlled -Current treatment  None currently - patient was not taking Oxybutynin 559mat all -Medications previously tried: Oxybutynin - ran out and did not refill -Reports she is having difficulty controlling her bladder -Recommended she restart this medication - will request refill sent in to her preferred pharmacy.  Depression (Goal: Minimize symptoms) -Controlled -Current treatment  Duloxetine 6027maily Rexulti 4mg54mily Trazodone 150mg69m-Medications previously tried: citalopram, amitriptyline  -Reports these medications are working great, getting adequate sleep -Recommended to continue current medication   Patient Goals/Self-Care Activities Patient will:  - take medications as prescribed focus on medication adherence by pill count check blood pressure daily, document, and provide at future appointments target a minimum of 150 minutes of moderate intensity exercise weekly  Follow Up Plan: The care management team will reach out to the patient again over the next 120 days.

## 2020-12-14 ENCOUNTER — Other Ambulatory Visit: Payer: Self-pay | Admitting: Internal Medicine

## 2020-12-14 DIAGNOSIS — J4541 Moderate persistent asthma with (acute) exacerbation: Secondary | ICD-10-CM

## 2020-12-16 ENCOUNTER — Other Ambulatory Visit: Payer: Self-pay | Admitting: Internal Medicine

## 2020-12-16 DIAGNOSIS — J4541 Moderate persistent asthma with (acute) exacerbation: Secondary | ICD-10-CM

## 2020-12-16 NOTE — Telephone Encounter (Signed)
No longer on this inhaler

## 2020-12-17 ENCOUNTER — Telehealth: Payer: Self-pay

## 2020-12-18 ENCOUNTER — Telehealth: Payer: Medicare Other

## 2020-12-18 ENCOUNTER — Other Ambulatory Visit: Payer: Self-pay

## 2020-12-18 MED ORDER — EPINEPHRINE 0.3 MG/0.3ML IJ SOAJ: 0.3000 mg | INTRAMUSCULAR | 3 refills | Status: DC | PRN

## 2020-12-20 ENCOUNTER — Telehealth: Payer: Self-pay | Admitting: Pharmacist

## 2020-12-20 DIAGNOSIS — I10 Essential (primary) hypertension: Secondary | ICD-10-CM | POA: Diagnosis not present

## 2020-12-20 DIAGNOSIS — J449 Chronic obstructive pulmonary disease, unspecified: Secondary | ICD-10-CM | POA: Diagnosis not present

## 2020-12-20 NOTE — Chronic Care Management (AMB) (Signed)
Chronic Care Management   Outreach Note  12/20/2020 Name: Summer Murphy MRN: [id number] DOB: [birth year]  Referred by: Lavera Guise, MD Reason for referral : Chronic Care Management (RPM Monthly Report)     Summer Murphy  Name: Summer Murphy  Date of Birth: Sep 11, 1955  Gender: Female  Primary provider: Clayborn Bigness  RPM Coordinator: Charlann Lange  ICD Code: I10 Device ID: [id number] [place]: iBP Measurement Type: blood pressure Days of Monitoring: 16 Documentation Time: 21 Min 0 Sec BLOOD PRESSURE  Measurement Date: 12/15/2020 Sunday at 27:74 PM Systolic / Diastolic (mmHg): 128 / 91 Measurement Date: 12/11/2020 Wednesday at 78:67 PM Systolic / Diastolic (mmHg): 672 / 64 Measurement Date: 12/08/2020 Sunday at 09:47 PM Systolic / Diastolic (mmHg): 096 / 79 Measurement Date: 12/07/2020 Saturday at 28:36 AM Systolic / Diastolic (mmHg): 629 / 76 Measurement Date: 12/06/2020 Friday at 47:65 PM Systolic / Diastolic (mmHg): 465 / 73 Measurement Date: 12/06/2020 Friday at 03:54 PM Systolic / Diastolic (mmHg): 656 / 68 Measurement Date: 12/06/2020 Friday at 81:27 PM Systolic / Diastolic (mmHg): 517 / 64 Measurement Date: 12/06/2020 Friday at 00:17 PM Systolic / Diastolic (mmHg): 494 / 74 Measurement Date: 12/06/2020 Friday at 49:67 AM Systolic / Diastolic (mmHg): 591 / 75 Measurement Date: 12/05/2020 Thursday at 63:84 PM Systolic / Diastolic (mmHg): 665 / 74 Measurement Date: 12/05/2020 Thursday at 99:35 AM Systolic / Diastolic (mmHg): 701 / 77 Measurement Date: 12/04/2020 Wednesday at 77:93 PM Systolic / Diastolic (mmHg): 903 / 62 Measurement Date: 12/04/2020 Wednesday at 00:92 PM Systolic / Diastolic (mmHg): 330 / 77 Measurement Date: 12/04/2020 Wednesday at 07:62 PM Systolic / Diastolic (mmHg): 263 / 77 Measurement Date: 12/04/2020 Wednesday at 33:54 PM Systolic / Diastolic (mmHg): 562 / 91 Measurement Date: 12/04/2020 Wednesday at 56:38 PM Systolic / Diastolic  (mmHg): 937 / 59 Measurement Date: 12/04/2020 Wednesday at 34:28 PM Systolic / Diastolic (mmHg): 94 / 58 Measurement Date: 12/02/2020 Monday at 76:81 PM Systolic / Diastolic (mmHg): 157 / 81 Measurement Date: 12/01/2020 Sunday at 26:20 PM Systolic / Diastolic (mmHg): 355 / 87 Measurement Date: 11/30/2020 Saturday at 97:41 PM Systolic / Diastolic (mmHg): 638 / 73 Measurement Date: 11/28/2020 Thursday at 45:36 PM Systolic / Diastolic (mmHg): 468 / 77 Measurement Date: 11/25/2020 Monday at 03:21 PM Systolic / Diastolic (mmHg): 224 / 68 Measurement Date: 11/24/2020 Sunday at 82:50 PM Systolic / Diastolic (mmHg): 037 / 78 Measurement Date: 11/23/2020 Saturday at 04:88 PM Systolic / Diastolic (mmHg): 891 / 75 Measurement Date: 11/23/2020 Saturday at 69:45 PM Systolic / Diastolic (mmHg): 038 / 77 Measurement Date: 11/22/2020 Friday at 88:28 AM Systolic / Diastolic (mmHg): 003 / 70 Measurement Date: 11/21/2020 Thursday at 49:17 PM Systolic / Diastolic (mmHg): 915 / 72  PULSE  Measurement Date: 12/15/2020 Sunday at 01:12 PM Pulse (IN BPM): 82 Measurement Date: 12/11/2020 Wednesday at 01:39 PM Pulse (IN BPM): 98 Measurement Date: 12/08/2020 Sunday at 09:07 PM Pulse (IN BPM): 95 Measurement Date: 12/07/2020 Saturday at 11:24 AM Pulse (IN BPM): 109 Measurement Date: 12/06/2020 Friday at 08:17 PM Pulse (IN BPM): 89 Measurement Date: 12/06/2020 Friday at 06:35 PM Pulse (IN BPM): 87 Measurement Date: 12/06/2020 Friday at 06:34 PM Pulse (IN BPM): 91 Measurement Date: 12/06/2020 Friday at 04:15 PM Pulse (IN BPM): 93 Measurement Date: 12/06/2020 Friday at 10:13 AM Pulse (IN BPM): 90 Measurement Date: 12/05/2020 Thursday at 09:16 PM Pulse (IN BPM): 87 Measurement Date: 12/05/2020 Thursday at 09:34 AM Pulse (IN BPM): 98 Measurement Date: 12/04/2020 Wednesday at 09:21 PM Pulse (IN  BPM): 99 Measurement Date: 12/04/2020 Wednesday at 08:00 PM Pulse (IN BPM): 88 Measurement Date: 12/04/2020 Wednesday at 07:55 PM  Pulse (IN BPM): 88 Measurement Date: 12/04/2020 Wednesday at 07:48 PM Pulse (IN BPM): 95 Measurement Date: 12/04/2020 Wednesday at 07:12 PM Pulse (IN BPM): 92 Measurement Date: 12/04/2020 Wednesday at 07:07 PM Pulse (IN BPM): 103 Measurement Date: 12/02/2020 Monday at 06:35 PM Pulse (IN BPM): 83 Measurement Date: 12/01/2020 Sunday at 12:38 PM Pulse (IN BPM): 92 Measurement Date: 11/30/2020 Saturday at 01:08 PM Pulse (IN BPM): 89 Measurement Date: 11/28/2020 Thursday at 08:41 PM Pulse (IN BPM): 88 Measurement Date: 11/25/2020 Monday at 09:21 PM Pulse (IN BPM): 90 Measurement Date: 11/24/2020 Sunday at 07:22 PM Pulse (IN BPM): 102 Measurement Date: 11/23/2020 Saturday at 09:07 PM Pulse (IN BPM): 94 Measurement Date: 11/23/2020 Saturday at 01:41 PM Pulse (IN BPM): 110 Measurement Date: 11/22/2020 Friday at 09:40 AM Pulse (IN BPM): 93 Measurement Date: 11/21/2020 Thursday at 05:23 PM Pulse (IN BPM): 92        Notes for September-2022 : 95 Min 0 Sec  Date and Time: 12/20/2020 Friday at 02:00 PM User: Leata Mouse Time logged: 06:00 Notes: DATA REVIEW SYSTOLIC BP  Automatically transmitted data is reviewed today from device. (ID: [id number]).  Systolic BP reading is >353 for 0 % of time  Systolic BP reading is between 160 to 179 for 0 % of time  Systolic BP reading is between 140 to 159 for 7.41 % of time  Systolic BP reading is between 120-139 for 66.67 % of time  Systolic BP reading is between 101-120 for 18.52 % of time  Systolic BP reading is <299 for 7.41 % of time  DIASTOLIC BP  Diastolic BP reading is elevated >100 for 0 % of time  Diastolic BP reading is between 90-99 for 7.41 % of time  PULSE  Pulse is >120 for 0 % of time  Pulse is <50 for 0 % of time  COMPLIANCE NOTES BLOOD PRESSURE  Patient is compliant - 16 days of readings obtained.  MANAGEMENT NOTES We will continue to monitor BP readings for now  No change on the treatment plan   Date  and Time: 12/16/2020 Monday at 03:21 PM User: Charlann Lange Time logged: 02:00 Notes: REVIEW NOTES All the daily Blood pressure data noted since last review  Heart rate/Pulse data reviewed   Date and Time: 12/11/2020 Wednesday at 04:26 PM User: Charlann Lange Time logged: 02:00 Notes: REVIEW NOTES All the daily Blood pressure data noted since last review  Heart rate/Pulse data reviewed   Date and Time: 12/09/2020 Monday at 04:34 PM User: Charlann Lange Time logged: 08:00 Notes: REVIEW NOTES All the daily Blood pressure data noted since last review  Heart rate/Pulse data reviewed   Date and Time: 12/06/2020 Friday at 04:16 PM User: Charlann Lange Time logged: 06:00 Notes: REVIEW NOTES All the daily Blood pressure data noted since last review  Heart rate/Pulse data reviewed   Date and Time: 12/05/2020 Thursday at 04:06 PM User: Charlann Lange Time logged: 14:00 Notes: REVIEW NOTES All the daily Blood pressure data noted since last review  Heart rate/Pulse data reviewed   Date and Time: 12/02/2020 Monday at 04:11 PM User: Charlann Lange Time logged: 04:00 Notes: REVIEW NOTES All the daily Blood pressure data noted since last review  Heart rate/Pulse data reviewed   Date and Time: 11/29/2020 Friday at 04:03 PM User: Charlann Lange Time logged: 02:00 Notes: REVIEW NOTES All the daily Blood pressure data noted  since last review  Heart rate/Pulse data reviewed   Date and Time: 11/26/2020 Tuesday at 04:22 PM User: Charlann Lange Time logged: 08:00 Notes: REVIEW NOTES All the daily Blood pressure data noted since last review  Heart rate/Pulse data reviewed   Date and Time: 11/22/2020 Friday at 03:48 PM User: Charlann Lange Time logged: 04:00 Notes: REVIEW NOTES All the daily Blood pressure data noted since last review  Heart rate/Pulse data reviewed  Verbal Consent Obtained on August 23, 2020. Patient was  notified about Remote Patient Monitoring (RPM) program offered by our medical practice to improve the patient care, co-ordination of care, patient education, provide quality of care, reduce the cost and provide personalized care. Patient was informed that this is Medicare approved program and can be furnished by only one provider, can be stopped by the patient anytime. Patient has verbally consented to enroll in this program and our designated care coordinator to contact by phone as needed at least for 20 minutes a month. Practice also provides 24/7 care to the patient needs. All patient questions were answered and patient was enrolled in the RPM program.

## 2020-12-25 ENCOUNTER — Ambulatory Visit: Payer: Medicare Other

## 2020-12-25 ENCOUNTER — Telehealth: Payer: Self-pay

## 2020-12-25 DIAGNOSIS — J301 Allergic rhinitis due to pollen: Secondary | ICD-10-CM

## 2020-12-25 NOTE — Telephone Encounter (Signed)
Left vm to schedule 1st allergy inj-[facility]

## 2020-12-25 NOTE — Telephone Encounter (Signed)
Patient states she needs rx for epi pen sent into [facility]

## 2020-12-26 ENCOUNTER — Other Ambulatory Visit: Payer: Self-pay | Admitting: Internal Medicine

## 2020-12-26 DIAGNOSIS — J4541 Moderate persistent asthma with (acute) exacerbation: Secondary | ICD-10-CM

## 2020-12-31 ENCOUNTER — Other Ambulatory Visit: Payer: Self-pay

## 2020-12-31 ENCOUNTER — Ambulatory Visit (INDEPENDENT_AMBULATORY_CARE_PROVIDER_SITE_OTHER): Payer: Medicare Other

## 2020-12-31 DIAGNOSIS — J301 Allergic rhinitis due to pollen: Secondary | ICD-10-CM

## 2020-12-31 NOTE — Progress Notes (Signed)
Pt here today for new allergy injections.  Pt given instructions on how to use Epi-Pen and given paperwork on symptoms if she needs to use Epi-pen.  Pt also given paperwork on new allergy injections and our policy and procedures in taking allergy injections.  Pt seemed to understand all that was instructed to her.  Pt was then given her allergy injections .05 ml of silver vial x 2, pt waited in office for 30 minutes and there were no signs of any skin reactions.  Pt instructed to take allergy medication before coming to injection appt's but if needing benadryl pt should wait to get home before taking due to it could make her sleepy. Pt also instructed to always bring her Epi-Pen to every injection appt.

## 2021-01-07 ENCOUNTER — Encounter: Payer: Self-pay | Admitting: Internal Medicine

## 2021-01-07 ENCOUNTER — Ambulatory Visit: Payer: Medicare Other

## 2021-01-08 ENCOUNTER — Ambulatory Visit (INDEPENDENT_AMBULATORY_CARE_PROVIDER_SITE_OTHER): Payer: Medicare Other

## 2021-01-08 ENCOUNTER — Other Ambulatory Visit: Payer: Self-pay

## 2021-01-08 DIAGNOSIS — J301 Allergic rhinitis due to pollen: Secondary | ICD-10-CM

## 2021-01-09 ENCOUNTER — Telehealth: Payer: Self-pay | Admitting: Pharmacist

## 2021-01-09 NOTE — Progress Notes (Signed)
Chronic Care Management Pharmacy Assistant   Name: Summer Murphy  MRN: [id number] DOB: 1955-05-02  Reason for Encounter: Disease State For COPD.    Conditions to be addressed/monitored: HTN, COPD, Diabetes, RLS, Depression, Chronic Pain, Urge Incontinence  Recent office visits:  11/26/20 Dr. Humphrey Rolls For seasonal allergic. No medication changes.   Recent consult visits:  11/28/20 Orthopedic Surgeon Lauris Poag, MD For post op. No medication changes.  Hospital visits:  11/14/20 [facility] Hessie Knows, MD. (30 Hours) For hardware removal. STOPPED Biotin, Clotrimazole-Betamethasone, and Docusate Sodium.   Medications: Outpatient Encounter Medications as of 01/09/2021  Medication Sig   acetaminophen (TYLENOL) 500 MG tablet Take 2 tablets (1,000 mg total) by mouth every 6 (six) hours.   Brexpiprazole 4 MG TABS Take 4 mg by mouth daily.   busPIRone (BUSPAR) 10 MG tablet Take 10 mg by mouth 2 (two) times daily.   clonazePAM (KLONOPIN) 0.5 MG tablet Take 0.5 mg by mouth at bedtime.   COMBIVENT RESPIMAT 20-100 MCG/ACT AERS respimat INHALE 1 PUFF INTO THE LUNGS EVERY 6 HOURS   diclofenac Sodium (VOLTAREN) 1 % GEL Apply 4 g topically 4 (four) times daily. (Patient taking differently: Apply 4 g topically 2 (two) times daily.)   DILT-XR 180 MG 24 hr capsule TAKE 1 CAPSULE(180 MG) BY MOUTH DAILY   diphenhydrAMINE (BENADRYL) 25 MG tablet Take 50 mg by mouth 2 (two) times daily as needed for allergies.   diphenoxylate-atropine (LOMOTIL) 2.5-0.025 MG tablet Take 1 tablet by mouth 3 (three) times daily as needed for diarrhea or loose stools.   DULoxetine (CYMBALTA) 60 MG capsule Take 60 mg by mouth at bedtime.   enoxaparin (LOVENOX) 40 MG/0.4ML injection Inject 0.4 mLs (40 mg total) into the skin daily for 7 days.   EPINEPHrine 0.3 mg/0.3 mL IJ SOAJ injection Inject 0.3 mg into the muscle as needed for anaphylaxis.   fluticasone (FLONASE) 50 MCG/ACT nasal spray Place 2  sprays into both nostrils daily.   fluticasone furoate-vilanterol (BREO ELLIPTA) 200-25 MCG/INH AEPB Inhale 1 puff into the lungs daily.   gabapentin (NEURONTIN) 600 MG tablet TAKE 1 TABLET BY MOUTH TWICE DAILY   ipratropium-albuterol (DUONEB) 0.5-2.5 (3) MG/3ML SOLN Take 3 mLs by nebulization every 4 (four) hours as needed (for shortness of breath/wheezing).   loratadine (CLARITIN) 10 MG tablet Take 1 tablet (10 mg total) by mouth daily.   metoprolol tartrate (LOPRESSOR) 25 MG tablet Take 1 tablet (25 mg total) by mouth 2 (two) times daily.   montelukast (SINGULAIR) 10 MG tablet Take 1 tablet (10 mg total) by mouth at bedtime.   nystatin (MYCOSTATIN) 100000 UNIT/ML suspension Take 5 mLs (500,000 Units total) by mouth 4 (four) times daily.   ondansetron (ZOFRAN) 4 MG tablet Take 1 tablet (4 mg total) by mouth every 6 (six) hours as needed for nausea.   oxybutynin (DITROPAN) 5 MG tablet Take 1 tablet (5 mg total) by mouth in the morning and at bedtime.   oxyCODONE (OXY IR/ROXICODONE) 5 MG immediate release tablet Take 1-2 tablets (5-10 mg total) by mouth every 4 (four) hours as needed for moderate pain (pain score 4-6).   pantoprazole (PROTONIX) 40 MG tablet Take 40 mg by mouth 2 (two) times daily.   rOPINIRole (REQUIP) 0.5 MG tablet Take two tablets po BID for restless legs.   sennosides-docusate sodium (SENOKOT-S) 8.6-50 MG tablet Take 2 tablets by mouth daily.   SUMAtriptan (IMITREX) 100 MG tablet Take 1 tablet (100 mg total) by mouth  every 2 (two) hours as needed (for migraine headaches.). May repeat in 1 hours if headache persists or recurs.   traZODone (DESYREL) 150 MG tablet Take 150 mg by mouth at bedtime.   valACYclovir (VALTREX) 500 MG tablet Take 1 tablet (500 mg total) by mouth daily as needed (for fever blisters).   No facility-administered encounter medications on file as of 01/09/2021.   Current COPD regimen:  Combivent Respimat 20-153mcg/act q6h prn Breo Ellipta 200-40mcg 1 puff  daily  No flowsheet data found.  Any recent hospitalizations or ED visits since last visit with CPP? Yes, documented above.  Reports COPD symptoms, including Increased shortness of breath , Symptoms worse with exercise, Symptoms worse at night, and Wheezing  What recent interventions/DTPs have been made by any provider to improve breathing since last visit:Spiriva 1.88mcg/act 2 puffs daily  Have you had exacerbation/flare-up since last visit? Patient stated No  What do you do when you are short of breath?  Adhere to COPD Action Plan, Rescue medication, and Rest  Respiratory Devices/Equipment Do you have a nebulizer? Patient stated No  Do you use a Peak Flow Meter? Patient stated No  Do you use a maintenance inhaler? Patient stated Yes  How often do you forget to use your daily inhaler? Patient stated never.   Do you use a rescue inhaler? Patient stated Yes  How often do you use your rescue inhaler? Patient stated multiple times per day  Do you use a spacer with your inhaler? Patient stated No  Adherence Review: Does the patient have >5 day gap between last estimated fill date for maintenance inhaler medications? No   Care Gaps:Patient is due for her DEXA Scan.  Star Rating Drugs:None.  Follow-Up:Pharmacist Review  Charlann Lange, [facility] Pharmacist Assistant [phone]

## 2021-01-15 ENCOUNTER — Ambulatory Visit: Payer: Medicare Other

## 2021-01-16 ENCOUNTER — Other Ambulatory Visit: Payer: Self-pay

## 2021-01-16 ENCOUNTER — Ambulatory Visit (INDEPENDENT_AMBULATORY_CARE_PROVIDER_SITE_OTHER): Payer: Medicare Other

## 2021-01-16 DIAGNOSIS — J301 Allergic rhinitis due to pollen: Secondary | ICD-10-CM | POA: Diagnosis not present

## 2021-01-17 ENCOUNTER — Inpatient Hospital Stay: Payer: Medicare Other | Attending: Oncology

## 2021-01-17 DIAGNOSIS — D509 Iron deficiency anemia, unspecified: Secondary | ICD-10-CM | POA: Insufficient documentation

## 2021-01-17 LAB — CBC WITH DIFFERENTIAL/PLATELET
Abs Immature Granulocytes: 0.01 10*3/uL (ref 0.00–0.07)
Basophils Absolute: 0 10*3/uL (ref 0.0–0.1)
Basophils Relative: 1 %
Eosinophils Absolute: 0.3 10*3/uL (ref 0.0–0.5)
Eosinophils Relative: 4 %
HCT: 39.9 % (ref 36.0–46.0)
Hemoglobin: 13.2 g/dL (ref 12.0–15.0)
Immature Granulocytes: 0 %
Lymphocytes Relative: 37 %
Lymphs Abs: 2.6 10*3/uL (ref 0.7–4.0)
MCH: 29.1 pg (ref 26.0–34.0)
MCHC: 33.1 g/dL (ref 30.0–36.0)
MCV: 88.1 fL (ref 80.0–100.0)
Monocytes Absolute: 0.6 10*3/uL (ref 0.1–1.0)
Monocytes Relative: 8 %
Neutro Abs: 3.7 10*3/uL (ref 1.7–7.7)
Neutrophils Relative %: 50 %
Platelets: 222 10*3/uL (ref 150–400)
RBC: 4.53 MIL/uL (ref 3.87–5.11)
RDW: 15.8 % — ABNORMAL HIGH (ref 11.5–15.5)
WBC: 7.2 10*3/uL (ref 4.0–10.5)
nRBC: 0 % (ref 0.0–0.2)

## 2021-01-17 LAB — IRON AND TIBC
Iron: 79 ug/dL (ref 28–170)
Saturation Ratios: 20 % (ref 10.4–31.8)
TIBC: 406 ug/dL (ref 250–450)
UIBC: 327 ug/dL

## 2021-01-17 LAB — FERRITIN: Ferritin: 42 ng/mL (ref 11–307)

## 2021-01-20 ENCOUNTER — Ambulatory Visit: Payer: Medicare Other | Admitting: Oncology

## 2021-01-20 ENCOUNTER — Telehealth: Payer: Self-pay | Admitting: Pharmacist

## 2021-01-20 ENCOUNTER — Ambulatory Visit: Payer: Medicare Other

## 2021-01-20 DIAGNOSIS — J449 Chronic obstructive pulmonary disease, unspecified: Secondary | ICD-10-CM

## 2021-01-20 DIAGNOSIS — I10 Essential (primary) hypertension: Secondary | ICD-10-CM

## 2021-01-20 NOTE — Chronic Care Management (AMB) (Signed)
Chronic Care Management   Outreach Note  01/20/2021 Name: Summer Murphy MRN: [id number] DOB: [birth year]  Referred by: Summer Guise, MD Reason for referral : No chief complaint on file.     [place]  Name: Summer Murphy  Date of Birth: [birth year]  Gender: Female  Primary provider: Clayborn Bigness  RPM Coordinator: Charlann Lange  ICD Code: I10 Device ID: [id number] [place]: iBP Measurement Type: blood pressure Days of Monitoring: 16 Documentation Time: 30 Min 0 Sec BLOOD PRESSURE  Measurement Date: 01/19/2021 Sunday at 65:99 PM Systolic / Diastolic (mmHg): 357 / 73 Measurement Date: 01/18/2021 Saturday at 01:77 PM Systolic / Diastolic (mmHg): 939 / 70 Measurement Date: 01/16/2021 Thursday at 03:00 PM Systolic / Diastolic (mmHg): 923 / 74 Measurement Date: 01/15/2021 Wednesday at 30:07 PM Systolic / Diastolic (mmHg): 622 / 95 Measurement Date: 01/14/2021 Tuesday at 63:33 AM Systolic / Diastolic (mmHg): 545 / 85 Measurement Date: 01/11/2021 Saturday at 62:56 PM Systolic / Diastolic (mmHg): 389 / 74 Measurement Date: 01/10/2021 Friday at 37:34 PM Systolic / Diastolic (mmHg): 287 / 75 Measurement Date: 01/08/2021 Wednesday at 68:11 PM Systolic / Diastolic (mmHg): 572 / 71 Measurement Date: 01/07/2021 Tuesday at 62:03 PM Systolic / Diastolic (mmHg): 559 / 71 Measurement Date: 01/04/2021 Saturday at 74:16 PM Systolic / Diastolic (mmHg): 384 / 79 Measurement Date: 01/02/2021 Thursday at 53:64 PM Systolic / Diastolic (mmHg): 680 / 87 Measurement Date: 01/01/2021 Wednesday at 32:12 PM Systolic / Diastolic (mmHg): 248 / 81 Measurement Date: 12/31/2020 Tuesday at 25:00 PM Systolic / Diastolic (mmHg): 370 / 84 Measurement Date: 12/27/2020 Friday at 48:88 PM Systolic / Diastolic (mmHg): 916 / 77 Measurement Date: 12/25/2020 Wednesday at 94:50 PM Systolic / Diastolic (mmHg): 388 / 86 Measurement Date: 12/23/2020 Monday at 82:80 PM Systolic / Diastolic (mmHg): 034 /  86  PULSE  Measurement Date: 01/19/2021 Sunday at 12:43 PM Pulse (IN BPM): 73 Measurement Date: 01/18/2021 Saturday at 12:39 PM Pulse (IN BPM): 73 Measurement Date: 01/16/2021 Thursday at 05:45 PM Pulse (IN BPM): 80 Measurement Date: 01/15/2021 Wednesday at 04:44 PM Pulse (IN BPM): 77 Measurement Date: 01/14/2021 Tuesday at 11:12 AM Pulse (IN BPM): 64 Measurement Date: 01/11/2021 Saturday at 12:43 PM Pulse (IN BPM): 78 Measurement Date: 01/10/2021 Friday at 04:08 PM Pulse (IN BPM): 91 Measurement Date: 01/08/2021 Wednesday at 04:48 PM Pulse (IN BPM): 92 Measurement Date: 01/07/2021 Tuesday at 03:17 PM Pulse (IN BPM): 94 Measurement Date: 01/04/2021 Saturday at 04:55 PM Pulse (IN BPM): 84 Measurement Date: 01/02/2021 Thursday at 05:51 PM Pulse (IN BPM): 88 Measurement Date: 01/01/2021 Wednesday at 12:04 PM Pulse (IN BPM): 89 Measurement Date: 12/31/2020 Tuesday at 03:54 PM Pulse (IN BPM): 93 Measurement Date: 12/27/2020 Friday at 05:46 PM Pulse (IN BPM): 94 Measurement Date: 12/25/2020 Wednesday at 09:23 PM Pulse (IN BPM): 90 Measurement Date: 12/23/2020 Monday at 04:40 PM Pulse (IN BPM): 87        Notes for October-2022 : 34 Min 0 Sec  Date and Time: 01/20/2021 Monday at 04:08 PM User: Leata Mouse Time logged: 08:00 Notes: DATA REVIEW SYSTOLIC BP  Automatically transmitted data is reviewed today from device. (ID: [id number]).  Systolic BP reading is >165 for 0 % of time  Systolic BP reading is between 160 to 179 for 0 % of time  Systolic BP reading is between 140 to 159 for 31.25 % of time  Systolic BP reading is between 120-139 for 50.0 % of time  Systolic BP reading is between 101-120 for 18.75 % of time  Systolic BP reading is <914 for 0 % of time  DIASTOLIC BP  Diastolic BP reading is elevated >100 for 0 % of time  Diastolic BP reading is between 90-99 for 6.25 % of time  PULSE  Pulse is >120 for 0 % of time  Pulse is <50 for 0 % of  time  COMPLIANCE NOTES BLOOD PRESSURE  Patient is compliant - 16 days of readings obtained.  MANAGEMENT NOTES We will continue to monitor BP readings for now  Medications for BP reviewed for this patient  No change on the treatment plan   Date and Time: 01/16/2021 Thursday at 04:35 PM User: Charlann Lange Time logged: 02:00 Notes: REVIEW NOTES All the daily Blood pressure data noted since last review  Heart rate/Pulse data reviewed   Date and Time: 01/14/2021 Tuesday at 04:12 PM User: Charlann Lange Time logged: 04:00 Notes: REVIEW NOTES All the daily Blood pressure data noted since last review  Heart rate/Pulse data reviewed   Date and Time: 01/13/2021 Monday at 04:23 PM User: Charlann Lange Time logged: 04:00 Notes: REVIEW NOTES All the daily Blood pressure data noted since last review  Heart rate/Pulse data reviewed   Date and Time: 01/08/2021 Wednesday at 04:15 PM User: Charlann Lange Time logged: 02:00 Notes: REVIEW NOTES All the daily Blood pressure data noted since last review  Heart rate/Pulse data reviewed   Date and Time: 01/06/2021 Monday at 04:24 PM User: Charlann Lange Time logged: 02:00 Notes: REVIEW NOTES All the daily Blood pressure data noted since last review  Heart rate/Pulse data reviewed   Date and Time: 01/03/2021 Friday at 04:22 PM User: Charlann Lange Time logged: 02:00 Notes: REVIEW NOTES All the daily Blood pressure data noted since last review  Heart rate/Pulse data reviewed   Date and Time: 01/01/2021 Wednesday at 04:25 PM User: Charlann Lange Time logged: 02:00 Notes: REVIEW NOTES All the daily Blood pressure data noted since last review  Heart rate/Pulse data reviewed   Date and Time: 12/31/2020 Tuesday at 04:28 PM User: Charlann Lange Time logged: 02:00 Notes: REVIEW NOTES All the daily Blood pressure data noted since last review  Heart rate/Pulse data reviewed   Date  and Time: 12/30/2020 Monday at 04:23 PM User: Charlann Lange Time logged: 02:00 Notes: REVIEW NOTES All the daily Blood pressure data noted since last review  Heart rate/Pulse data reviewed   Date and Time: 12/26/2020 Thursday at 04:12 PM User: Charlann Lange Time logged: 02:00 Notes: REVIEW NOTES All the daily Blood pressure data noted since last review  Heart rate/Pulse data reviewed   Date and Time: 12/24/2020 Tuesday at 02:24 PM User: Charlann Lange Time logged: 02:00 Notes: REVIEW NOTES All the daily Blood pressure data noted since last review  Heart rate/Pulse data reviewed  Verbal Consent Obtained on August 23, 2020. Patient was notified about Remote Patient Monitoring (RPM) program offered by our medical practice to improve the patient care, co-ordination of care, patient education, provide quality of care, reduce the cost and provide personalized care. Patient was informed that this is Medicare approved program and can be furnished by only one provider, can be stopped by the patient anytime. Patient has verbally consented to enroll in this program and our designated care coordinator to contact by phone as needed at least for 20 minutes a month. Practice also provides 24/7 care to the patient needs. All patient questions were answered and patient was enrolled in the RPM program.

## 2021-01-21 ENCOUNTER — Encounter: Payer: Self-pay | Admitting: Oncology

## 2021-01-21 ENCOUNTER — Inpatient Hospital Stay: Payer: Medicare Other | Attending: Oncology | Admitting: Oncology

## 2021-01-21 ENCOUNTER — Other Ambulatory Visit: Payer: Self-pay

## 2021-01-21 ENCOUNTER — Inpatient Hospital Stay: Payer: Medicare Other

## 2021-01-21 VITALS — BP 133/76 | HR 85 | Temp 98.8°F | Resp 18 | Wt 174.7 lb

## 2021-01-21 DIAGNOSIS — D509 Iron deficiency anemia, unspecified: Secondary | ICD-10-CM | POA: Insufficient documentation

## 2021-01-21 DIAGNOSIS — Z79899 Other long term (current) drug therapy: Secondary | ICD-10-CM | POA: Diagnosis not present

## 2021-01-21 DIAGNOSIS — Z7951 Long term (current) use of inhaled steroids: Secondary | ICD-10-CM | POA: Insufficient documentation

## 2021-01-21 DIAGNOSIS — I10 Essential (primary) hypertension: Secondary | ICD-10-CM | POA: Diagnosis not present

## 2021-01-21 DIAGNOSIS — Z8719 Personal history of other diseases of the digestive system: Secondary | ICD-10-CM | POA: Insufficient documentation

## 2021-01-21 DIAGNOSIS — D500 Iron deficiency anemia secondary to blood loss (chronic): Secondary | ICD-10-CM | POA: Diagnosis not present

## 2021-01-21 DIAGNOSIS — K2270 Barrett's esophagus without dysplasia: Secondary | ICD-10-CM | POA: Diagnosis not present

## 2021-01-21 NOTE — Progress Notes (Signed)
Hematology/Oncology progress note [facility] [phone] [phone]   Patient Care Team: Lavera Guise, MD as PCP - General (Internal Medicine) Edythe Clarity, [facility] as Pharmacist (Pharmacist)  REFERRING PROVIDER: Lavera Guise, MD  CHIEF COMPLAINTS/REASON FOR VISIT:  Follow-up iron deficiency anemia  HISTORY OF PRESENTING ILLNESS:   Summer Murphy is a  65 y.o.  female with PMH listed below was seen in consultation at the request of  Lavera Guise, MD  for evaluation of iron deficiency anemia.  Reviewed patient's previous labs.  Anemia is chronic, date back to August 2021.  09/16/20 TIBC 454, ferritin 12, iron saturation 5,  10/11/20 Hemoglobin 10.5,   Patient history of Barrett's esophagus, has had multiple EGDs.  Recent EGDs listed below.  05/29/2020 upper EGD Barrett's esophagus. Treated with argon plasma  coagulation (APC).   - Benign-appearing esophageal stenosis. Dilated.  - LA Grade B reflux esophagitis with no bleeding.  - An anti-reflux surgical site was found herniated  above the diaphragm.   07/24/2020 upper EGD showed Barrett's esophagus. Treated with argon plasma coagulation (APC). Benign-appearing esophageal stenosis. Dilated.      - An anti-reflux surgical site was found.   - Large hiatal hernia.   10/02/2020 upper EGD showed There is no endoscopic evidence of Barrett's esophagus. Biopsied. Large hiatal hernia, - An anti-reflux surgical site was found. - A large amount of food (residue) in the stomach.  Stomach high cardia biopsy showed cardio-oxyntic type mucosa with faveolar hyperplasia.  Esophagus biopsy showed squamous esophageal mucosa with chronic esophagitis and increased intraepithelial eosinophils.   She has had colonoscopy in 2018, procedure was aborted due to poor prep.   Patient reports fatigue. Denies any black or bloody stool. She has chronic constipation and can not tolerate oral iron supplementation.    INTERVAL HISTORY Summer Murphy is a 65 y.o. female who has above history reviewed by me today presents for follow up visit for management of iron deficiency anemia. Problems and complaints are listed below: Patient has received IV Venofer treatments.  She tolerates well with no side effects.  Fatigue level has improved slightly.  Review of Systems  Constitutional:  Positive for fatigue. Negative for appetite change, chills and fever.  HENT:   Negative for hearing loss and voice change.   Eyes:  Negative for eye problems.  Respiratory:  Negative for chest tightness and cough.   Cardiovascular:  Negative for chest pain.  Gastrointestinal:  Negative for abdominal distention, abdominal pain and blood in stool.  Endocrine: Negative for hot flashes.  Genitourinary:  Negative for difficulty urinating and frequency.   Musculoskeletal:  Negative for arthralgias.  Skin:  Negative for itching and rash.  Neurological:  Negative for extremity weakness.  Hematological:  Negative for adenopathy.  Psychiatric/Behavioral:  Negative for confusion.    MEDICAL HISTORY:  Past Medical History:  Diagnosis Date   Acid reflux    Anemia    Anxiety    Arrhythmia    treated with meds and has no current problems   Arthritis    most uncomfortable in knees   Asthma    uses several inhalers   Depression    Fever blister    Hematuria    History of kidney stones    Hypertension    Hypoglycemia    Left flank pain    Migraine    Pre-diabetes    Restless leg    Yeast vaginitis     SURGICAL HISTORY: Past  Surgical History:  Procedure Laterality Date   ABDOMINAL HYSTERECTOMY     AMPUTATION TOE Left 07/31/2016   Procedure: AMPUTATION TOE/MPJ 2nd toe;  Surgeon: Sharlotte Alamo, DPM;  Location: [facility] ORS;  Service: Podiatry;  Laterality: Left;   APPENDECTOMY  1990   BACK SURGERY     low back   BREAST SURGERY     bilateral breast reduction   CARDIAC ELECTROPHYSIOLOGY STUDY AND ABLATION      CHOLECYSTECTOMY  1990   COLONOSCOPY WITH PROPOFOL N/A 01/04/2017   Procedure: COLONOSCOPY WITH PROPOFOL;  Surgeon: Manya Silvas, MD;  Location: [facility] ENDOSCOPY;  Service: Endoscopy;  Laterality: N/A;   CORNEAL TRANSPLANT     ESOPHAGOGASTRODUODENOSCOPY (EGD) WITH PROPOFOL N/A 01/04/2017   Procedure: ESOPHAGOGASTRODUODENOSCOPY (EGD) WITH PROPOFOL;  Surgeon: Manya Silvas, MD;  Location: [facility] ENDOSCOPY;  Service: Endoscopy;  Laterality: N/A;   EXCISION BONE CYST Left 07/31/2016   Procedure: EXCISION BONE CYST/exostectomy 28124/left 2nd;  Surgeon: Sharlotte Alamo, DPM;  Location: [facility] ORS;  Service: Podiatry;  Laterality: Left;   EXTRACORPOREAL SHOCK WAVE LITHOTRIPSY Left 09/12/2015   Procedure: EXTRACORPOREAL SHOCK WAVE LITHOTRIPSY (ESWL);  Surgeon: Hollice Espy, MD;  Location: [facility] ORS;  Service: Urology;  Laterality: Left;   FRACTURE SURGERY     left foot   HARDWARE REMOVAL Left 11/14/2020   Procedure: HARDWARE REMOVAL;  Surgeon: Hessie Knows, MD;  Location: [facility] ORS;  Service: Orthopedics;  Laterality: Left;   [place] repair     JOINT REPLACEMENT Bilateral 2013,2014   total knees   LAPAROSCOPIC HYSTERECTOMY     LITHOTRIPSY     periprosthetic supracondylar fracture of left femur  02/16/2020   [facility]   TONSILLECTOMY     TOTAL KNEE REVISION Right 11/14/2019   Procedure: Revision patella and tibial polyethylene;  Surgeon: Hessie Knows, MD;  Location: [facility] ORS;  Service: Orthopedics;  Laterality: Right;   URETEROSCOPY      SOCIAL HISTORY: Social History   Socioeconomic History   Marital status: Widowed    Spouse name: Not on file   Number of children: Not on file   Years of education: Not on file   Highest education level: Not on file  Occupational History   Not on file  Tobacco Use   Smoking status: Never   Smokeless tobacco: Never  Vaping Use   Vaping Use: Never used  Substance and Sexual Activity   Alcohol use: No   Drug use: No   Sexual activity: Not on file  Other  Topics Concern   Not on file  Social History Narrative   Not on file   Social Determinants of Health   Financial Resource Strain: Not on file  Food Insecurity: Not on file  Transportation Needs: Not on file  Physical Activity: Not on file  Stress: Not on file  Social Connections: Not on file  Intimate Partner Violence: Not on file    FAMILY HISTORY: Family History  Problem Relation Age of Onset   Hypertension Mother    Stroke Mother    Prostate cancer Father    Kidney Stones Father    Diabetes Brother    Hypertension Brother    Breast cancer Maternal Aunt    Kidney disease Neg Hx     ALLERGIES:  is allergic to librium [chlordiazepoxide], metoclopramide, vanilla, aspirin, nsaids, azithromycin, buprenorphine hcl, chlordiazepoxide hcl, morphine, sulfa antibiotics, tolmetin, amlodipine besylate, and iron.  MEDICATIONS:  Current Outpatient Medications  Medication Sig Dispense Refill   acetaminophen (TYLENOL) 500 MG tablet Take 2  tablets (1,000 mg total) by mouth every 6 (six) hours. 30 tablet 0   Brexpiprazole 4 MG TABS Take 4 mg by mouth daily.     busPIRone (BUSPAR) 10 MG tablet Take 10 mg by mouth 2 (two) times daily.     clonazePAM (KLONOPIN) 0.5 MG tablet Take 0.5 mg by mouth at bedtime.     COMBIVENT RESPIMAT 20-100 MCG/ACT AERS respimat INHALE 1 PUFF INTO THE LUNGS EVERY 6 HOURS 4 g 3   diclofenac Sodium (VOLTAREN) 1 % GEL Apply 4 g topically 4 (four) times daily. (Patient taking differently: Apply 4 g topically 2 (two) times daily.) 350 g 1   DILT-XR 180 MG 24 hr capsule TAKE 1 CAPSULE(180 MG) BY MOUTH DAILY 90 capsule 1   diphenhydrAMINE (BENADRYL) 25 MG tablet Take 50 mg by mouth 2 (two) times daily as needed for allergies.     diphenoxylate-atropine (LOMOTIL) 2.5-0.025 MG tablet Take 1 tablet by mouth 3 (three) times daily as needed for diarrhea or loose stools. 30 tablet 0   DULoxetine (CYMBALTA) 60 MG capsule Take 60 mg by mouth at bedtime.     fluticasone  (FLONASE) 50 MCG/ACT nasal spray Place 2 sprays into both nostrils daily. 16 g 6   fluticasone furoate-vilanterol (BREO ELLIPTA) 200-25 MCG/INH AEPB Inhale 1 puff into the lungs daily. 60 each 3   gabapentin (NEURONTIN) 600 MG tablet TAKE 1 TABLET BY MOUTH TWICE DAILY 60 tablet 3   ipratropium-albuterol (DUONEB) 0.5-2.5 (3) MG/3ML SOLN Take 3 mLs by nebulization every 4 (four) hours as needed (for shortness of breath/wheezing). 360 mL 6   loratadine (CLARITIN) 10 MG tablet Take 1 tablet (10 mg total) by mouth daily. 30 tablet 11   metoprolol tartrate (LOPRESSOR) 25 MG tablet Take 1 tablet (25 mg total) by mouth 2 (two) times daily. 180 tablet 1   montelukast (SINGULAIR) 10 MG tablet Take 1 tablet (10 mg total) by mouth at bedtime. 90 tablet 1   ondansetron (ZOFRAN) 4 MG tablet Take 1 tablet (4 mg total) by mouth every 6 (six) hours as needed for nausea. 20 tablet 0   oxybutynin (DITROPAN) 5 MG tablet Take 1 tablet (5 mg total) by mouth in the morning and at bedtime. 180 tablet 1   pantoprazole (PROTONIX) 40 MG tablet Take 40 mg by mouth 2 (two) times daily.     rOPINIRole (REQUIP) 0.5 MG tablet Take two tablets po BID for restless legs. 360 tablet 1   sennosides-docusate sodium (SENOKOT-S) 8.6-50 MG tablet Take 2 tablets by mouth daily. 30 tablet 1   SUMAtriptan (IMITREX) 100 MG tablet Take 1 tablet (100 mg total) by mouth every 2 (two) hours as needed (for migraine headaches.). May repeat in 1 hours if headache persists or recurs. 10 tablet 3   traZODone (DESYREL) 150 MG tablet Take 150 mg by mouth at bedtime.     valACYclovir (VALTREX) 500 MG tablet Take 1 tablet (500 mg total) by mouth daily as needed (for fever blisters). 90 tablet 1   EPINEPHrine 0.3 mg/0.3 mL IJ SOAJ injection Inject 0.3 mg into the muscle as needed for anaphylaxis. (Patient not taking: Reported on 01/21/2021) 1 each 3   nystatin (MYCOSTATIN) 100000 UNIT/ML suspension Take 5 mLs (500,000 Units total) by mouth 4 (four) times  daily. (Patient not taking: Reported on 01/21/2021) 60 mL 0   oxyCODONE (OXY IR/ROXICODONE) 5 MG immediate release tablet Take 1-2 tablets (5-10 mg total) by mouth every 4 (four) hours as needed for moderate pain (  pain score 4-6). (Patient not taking: Reported on 01/21/2021) 30 tablet 0   No current facility-administered medications for this visit.     PHYSICAL EXAMINATION: ECOG PERFORMANCE STATUS: 1 - Symptomatic but completely ambulatory Vitals:   01/21/21 1308  BP: 133/76  Pulse: 85  Resp: 18  Temp: 98.8 F (37.1 C)   Filed Weights   01/21/21 1308  Weight: 174 lb 11.2 oz (79.2 kg)    Physical Exam Constitutional:      General: She is not in acute distress. HENT:     Head: Normocephalic and atraumatic.  Eyes:     General: No scleral icterus. Cardiovascular:     Rate and Rhythm: Normal rate and regular rhythm.     Heart sounds: Normal heart sounds.  Pulmonary:     Effort: Pulmonary effort is normal. No respiratory distress.     Breath sounds: No wheezing.  Abdominal:     General: Bowel sounds are normal. There is no distension.     Palpations: Abdomen is soft.  Musculoskeletal:        General: No deformity. Normal range of motion.     Cervical back: Normal range of motion and neck supple.  Skin:    General: Skin is warm and dry.     Findings: No erythema or rash.  Neurological:     Mental Status: She is alert and oriented to person, place, and time. Mental status is at baseline.     Cranial Nerves: No cranial nerve deficit.     Coordination: Coordination normal.  Psychiatric:        Mood and Affect: Mood normal.    LABORATORY DATA:  I have reviewed the data as listed Lab Results  Component Value Date   WBC 7.2 01/17/2021   HGB 13.2 01/17/2021   HCT 39.9 01/17/2021   MCV 88.1 01/17/2021   PLT 222 01/17/2021   Recent Labs    02/16/20 0315 08/21/20 1608 09/16/20 1119 11/14/20 1653  NA 137 139 139  --   K 2.9* 3.6 4.2  --   CL 95* 101 101  --   CO2 30  25 20   --   GLUCOSE 162* 125* 109*  --   BUN 12 11 14   --   CREATININE 1.07* 0.63 0.68 0.73  CALCIUM 8.3* 9.6 9.4  --   GFRNONAA 58* >60  --  >60  PROT  --   --  6.4  --   ALBUMIN  --   --  4.4  --   AST  --   --  19  --   ALT  --   --  15  --   ALKPHOS  --   --  122*  --   BILITOT  --   --  <0.2  --     Iron/TIBC/Ferritin/ %Sat    Component Value Date/Time   IRON 79 01/17/2021 1358   IRON 23 (L) 09/16/2020 1119   IRON 100 05/09/2012 1323   TIBC 406 01/17/2021 1358   TIBC 454 (H) 09/16/2020 1119   TIBC 393 05/09/2012 1323   FERRITIN 42 01/17/2021 1358   FERRITIN 12 (L) 09/16/2020 1119   FERRITIN 67 05/09/2012 1323   IRONPCTSAT 20 01/17/2021 1358   IRONPCTSAT 5 (LL) 09/16/2020 1119   IRONPCTSAT 25 05/09/2012 1323      RADIOGRAPHIC STUDIES: I have personally reviewed the radiological images as listed and agreed with the findings in the report. DG C-Arm 1-60 Min  Result Date: 11/14/2020  CLINICAL DATA:  Hardware removal. EXAM: DG C-ARM 1-60 MIN; RIGHT FEMUR 2 VIEWS FLUOROSCOPY TIME:  Fluoroscopy Time:  1 minute and 6 seconds. Radiation Exposure Index (if provided by the fluoroscopic device): 12 mGy. Number of Acquired Spot Images: 1 COMPARISON:  None. FINDINGS: A single C-arm fluoroscopic image was obtained intraoperatively and submitted for post operative interpretation. This image demonstrates total knee arthroplasty with healed distal femur fracture. Per operative note, multiple screws where removed. Please see the performing provider's procedural report for further detail. IMPRESSION: Intraoperative fluoroscopy, as detailed above. Electronically Signed   By: Margaretha Sheffield M.D.   On: 11/14/2020 13:59   DG FEMUR, MIN 2 VIEWS RIGHT  Result Date: 11/14/2020 CLINICAL DATA:  Hardware removal. EXAM: DG C-ARM 1-60 MIN; RIGHT FEMUR 2 VIEWS FLUOROSCOPY TIME:  Fluoroscopy Time:  1 minute and 6 seconds. Radiation Exposure Index (if provided by the fluoroscopic device): 12 mGy. Number  of Acquired Spot Images: 1 COMPARISON:  None. FINDINGS: A single C-arm fluoroscopic image was obtained intraoperatively and submitted for post operative interpretation. This image demonstrates total knee arthroplasty with healed distal femur fracture. Per operative note, multiple screws where removed. Please see the performing provider's procedural report for further detail. IMPRESSION: Intraoperative fluoroscopy, as detailed above. Electronically Signed   By: Margaretha Sheffield M.D.   On: 11/14/2020 13:59       ASSESSMENT & PLAN:  1. Iron deficiency anemia, unspecified iron deficiency anemia type    #Iron deficiency anemia, Labs reviewed and discussed with patient Hemoglobin has significantly improved to 13.2 lIron panel has also improved with a ferritin of 42 and iron saturation 20. I will hold off IV Venofer treatments today. She may take multivitamin containing iron for maintenance.  Follow-up in 4 months.  Labs CBC iron ferritin TIBC, prior to MD +/- Venofer. Orders Placed This Encounter  Procedures   CBC with Differential/Platelet    Standing Status:   Future    Standing Expiration Date:   01/21/2022   Comprehensive metabolic panel    Standing Status:   Future    Standing Expiration Date:   01/21/2022   Ferritin    Standing Status:   Future    Standing Expiration Date:   01/21/2022   Iron and TIBC    Standing Status:   Future    Standing Expiration Date:   01/21/2022    All questions were answered. The patient knows to call the clinic with any problems questions or concerns.  cc Lavera Guise, MD    Earlie Server, MD, PhD Hematology Oncology [place] at [facility]  01/21/2021

## 2021-01-21 NOTE — Progress Notes (Signed)
1 

## 2021-01-22 DIAGNOSIS — Z9889 Other specified postprocedural states: Secondary | ICD-10-CM | POA: Diagnosis not present

## 2021-01-22 DIAGNOSIS — Z96651 Presence of right artificial knee joint: Secondary | ICD-10-CM | POA: Diagnosis not present

## 2021-01-23 ENCOUNTER — Ambulatory Visit (INDEPENDENT_AMBULATORY_CARE_PROVIDER_SITE_OTHER): Payer: Medicare Other

## 2021-01-23 ENCOUNTER — Other Ambulatory Visit: Payer: Self-pay

## 2021-01-23 DIAGNOSIS — J301 Allergic rhinitis due to pollen: Secondary | ICD-10-CM | POA: Diagnosis not present

## 2021-01-27 ENCOUNTER — Other Ambulatory Visit: Payer: Self-pay

## 2021-01-27 NOTE — Progress Notes (Addendum)
Comments     Patient Details  Name: Summer Murphy  MRN: [id number]  Date of Birth: 1955-04-21   Order 1 of 2   Vial Label: DUST MITE,GRASS AND CAT HAIR   0.20 mL of each antigen: [place] Grass, Cat Hair, Dust Mite Mix, and Grass Mix    .80 mL Extract Subtotal  4.2   mL Normal Saline Diluent  5.0  mL Maintenance Total   Vial Label: 2 MOLD AND COCKROACH  0.20 mL of each antigen: Aspergillus [place] and Cockroach Mix    .40  mL Extract Subtotal  4.6   mL Normal Saline Diluent  5.0  mL Maintenance Total    Final Concentration above is stated in weight/volume (wt/vol).  Allergen units (AU/ml) biological units (BAU/ml).  The total volume is 5 ml.

## 2021-01-30 ENCOUNTER — Ambulatory Visit (INDEPENDENT_AMBULATORY_CARE_PROVIDER_SITE_OTHER): Payer: Medicare Other

## 2021-01-30 ENCOUNTER — Other Ambulatory Visit: Payer: Self-pay

## 2021-01-30 DIAGNOSIS — J301 Allergic rhinitis due to pollen: Secondary | ICD-10-CM | POA: Diagnosis not present

## 2021-01-31 DIAGNOSIS — M6281 Muscle weakness (generalized): Secondary | ICD-10-CM | POA: Diagnosis not present

## 2021-01-31 DIAGNOSIS — M25562 Pain in left knee: Secondary | ICD-10-CM | POA: Diagnosis not present

## 2021-01-31 DIAGNOSIS — M25662 Stiffness of left knee, not elsewhere classified: Secondary | ICD-10-CM | POA: Diagnosis not present

## 2021-01-31 DIAGNOSIS — G8929 Other chronic pain: Secondary | ICD-10-CM | POA: Diagnosis not present

## 2021-02-04 DIAGNOSIS — M25562 Pain in left knee: Secondary | ICD-10-CM | POA: Diagnosis not present

## 2021-02-04 DIAGNOSIS — G8929 Other chronic pain: Secondary | ICD-10-CM | POA: Diagnosis not present

## 2021-02-06 ENCOUNTER — Other Ambulatory Visit: Payer: Self-pay

## 2021-02-06 ENCOUNTER — Ambulatory Visit (INDEPENDENT_AMBULATORY_CARE_PROVIDER_SITE_OTHER): Payer: Medicare Other

## 2021-02-06 DIAGNOSIS — J301 Allergic rhinitis due to pollen: Secondary | ICD-10-CM

## 2021-02-06 DIAGNOSIS — G8929 Other chronic pain: Secondary | ICD-10-CM | POA: Diagnosis not present

## 2021-02-06 DIAGNOSIS — M25562 Pain in left knee: Secondary | ICD-10-CM | POA: Diagnosis not present

## 2021-02-07 ENCOUNTER — Other Ambulatory Visit: Payer: Self-pay | Admitting: Internal Medicine

## 2021-02-07 DIAGNOSIS — G63 Polyneuropathy in diseases classified elsewhere: Secondary | ICD-10-CM

## 2021-02-11 ENCOUNTER — Ambulatory Visit: Payer: Medicare Other

## 2021-02-11 ENCOUNTER — Other Ambulatory Visit: Payer: Self-pay

## 2021-02-11 ENCOUNTER — Ambulatory Visit (INDEPENDENT_AMBULATORY_CARE_PROVIDER_SITE_OTHER): Payer: Medicare Other

## 2021-02-11 DIAGNOSIS — G8929 Other chronic pain: Secondary | ICD-10-CM | POA: Diagnosis not present

## 2021-02-11 DIAGNOSIS — M25562 Pain in left knee: Secondary | ICD-10-CM | POA: Diagnosis not present

## 2021-02-11 DIAGNOSIS — J301 Allergic rhinitis due to pollen: Secondary | ICD-10-CM

## 2021-02-18 ENCOUNTER — Ambulatory Visit (INDEPENDENT_AMBULATORY_CARE_PROVIDER_SITE_OTHER): Payer: Medicare Other

## 2021-02-18 ENCOUNTER — Other Ambulatory Visit: Payer: Self-pay

## 2021-02-18 DIAGNOSIS — G8929 Other chronic pain: Secondary | ICD-10-CM | POA: Diagnosis not present

## 2021-02-18 DIAGNOSIS — M25562 Pain in left knee: Secondary | ICD-10-CM | POA: Diagnosis not present

## 2021-02-18 DIAGNOSIS — J301 Allergic rhinitis due to pollen: Secondary | ICD-10-CM | POA: Diagnosis not present

## 2021-02-20 DIAGNOSIS — M25562 Pain in left knee: Secondary | ICD-10-CM | POA: Diagnosis not present

## 2021-02-20 DIAGNOSIS — G8929 Other chronic pain: Secondary | ICD-10-CM | POA: Diagnosis not present

## 2021-02-25 ENCOUNTER — Ambulatory Visit (INDEPENDENT_AMBULATORY_CARE_PROVIDER_SITE_OTHER): Payer: Medicare Other

## 2021-02-25 ENCOUNTER — Other Ambulatory Visit: Payer: Self-pay

## 2021-02-25 DIAGNOSIS — J301 Allergic rhinitis due to pollen: Secondary | ICD-10-CM

## 2021-02-25 DIAGNOSIS — M25562 Pain in left knee: Secondary | ICD-10-CM | POA: Diagnosis not present

## 2021-02-25 DIAGNOSIS — G8929 Other chronic pain: Secondary | ICD-10-CM | POA: Diagnosis not present

## 2021-02-27 DIAGNOSIS — M25562 Pain in left knee: Secondary | ICD-10-CM | POA: Diagnosis not present

## 2021-02-27 DIAGNOSIS — G8929 Other chronic pain: Secondary | ICD-10-CM | POA: Diagnosis not present

## 2021-03-04 ENCOUNTER — Other Ambulatory Visit: Payer: Self-pay

## 2021-03-04 ENCOUNTER — Ambulatory Visit (INDEPENDENT_AMBULATORY_CARE_PROVIDER_SITE_OTHER): Payer: Medicare Other

## 2021-03-04 DIAGNOSIS — J301 Allergic rhinitis due to pollen: Secondary | ICD-10-CM | POA: Diagnosis not present

## 2021-03-04 DIAGNOSIS — G8929 Other chronic pain: Secondary | ICD-10-CM | POA: Diagnosis not present

## 2021-03-04 DIAGNOSIS — M25562 Pain in left knee: Secondary | ICD-10-CM | POA: Diagnosis not present

## 2021-03-05 DIAGNOSIS — T8484XA Pain due to internal orthopedic prosthetic devices, implants and grafts, initial encounter: Secondary | ICD-10-CM | POA: Diagnosis not present

## 2021-03-05 DIAGNOSIS — Z9889 Other specified postprocedural states: Secondary | ICD-10-CM | POA: Diagnosis not present

## 2021-03-05 DIAGNOSIS — Z96659 Presence of unspecified artificial knee joint: Secondary | ICD-10-CM | POA: Diagnosis not present

## 2021-03-05 DIAGNOSIS — M978XXA Periprosthetic fracture around other internal prosthetic joint, initial encounter: Secondary | ICD-10-CM | POA: Diagnosis not present

## 2021-03-06 DIAGNOSIS — M25562 Pain in left knee: Secondary | ICD-10-CM | POA: Diagnosis not present

## 2021-03-06 DIAGNOSIS — G8929 Other chronic pain: Secondary | ICD-10-CM | POA: Diagnosis not present

## 2021-03-08 ENCOUNTER — Other Ambulatory Visit: Payer: Self-pay | Admitting: Internal Medicine

## 2021-03-08 DIAGNOSIS — I10 Essential (primary) hypertension: Secondary | ICD-10-CM

## 2021-03-11 ENCOUNTER — Ambulatory Visit (INDEPENDENT_AMBULATORY_CARE_PROVIDER_SITE_OTHER): Payer: Medicare Other

## 2021-03-11 ENCOUNTER — Other Ambulatory Visit: Payer: Self-pay

## 2021-03-11 DIAGNOSIS — J301 Allergic rhinitis due to pollen: Secondary | ICD-10-CM | POA: Diagnosis not present

## 2021-03-18 ENCOUNTER — Encounter: Payer: Self-pay | Admitting: Internal Medicine

## 2021-03-20 ENCOUNTER — Other Ambulatory Visit: Payer: Self-pay

## 2021-03-20 ENCOUNTER — Ambulatory Visit (INDEPENDENT_AMBULATORY_CARE_PROVIDER_SITE_OTHER): Payer: Medicare Other

## 2021-03-20 DIAGNOSIS — J301 Allergic rhinitis due to pollen: Secondary | ICD-10-CM | POA: Diagnosis not present

## 2021-03-25 ENCOUNTER — Encounter: Payer: Self-pay | Admitting: Oncology

## 2021-03-25 ENCOUNTER — Ambulatory Visit (INDEPENDENT_AMBULATORY_CARE_PROVIDER_SITE_OTHER): Payer: Medicare Other

## 2021-03-25 ENCOUNTER — Other Ambulatory Visit: Payer: Self-pay

## 2021-03-25 DIAGNOSIS — J301 Allergic rhinitis due to pollen: Secondary | ICD-10-CM | POA: Diagnosis not present

## 2021-03-28 ENCOUNTER — Telehealth: Payer: Self-pay | Admitting: Student-PharmD

## 2021-03-28 NOTE — Progress Notes (Addendum)
General Review Call   Summer Murphy, Summer Murphy [id number] 29 years, Female  DOB: [birth year]  M: [phone]  General Review [facility]) Completed by Charlann Lange on 03/27/2021  Chart Review What recent interventions/DTPs have been made by any provider to improve the patient's conditions in the last 3 months?:   Consults: 01/21/21 Oncology Earlie Server, MD. For Iron deficiency. STOPPED Enoxaparin.  01/22/21 Orthopedic Surgery Lauris Poag, MD. For pain. No medication changes.  03/05/21 Orthopedic Surgery Lauris Poag, MD. For pain. No medication changes.  Any recent hospitalizations or ED visits since last visit with CPP?: No  Adherence Review  Adherence rates for STAR metric medications: None.  Adherence rates for medications indicated for disease state being reviewed: None.  Does the patient have >5 day gap between last estimated fill dates for any of the above medications?: No  Disease State Questions: Able to connect with the Patient?: Yes  Did patient have any problems with their health recently?: No  Did patient have any problems with their pharmacy?: No  Does patient have any issues or side effects with their medications?: No  Additional  information to pass to Patient's CPP?: No  Anything we can do to help take better care of Patient?: No  Misc. Response/Information:: Patient stated she has completed all of her physical therapy and is doing physical therapy at home.  Charlann Lange, [place] Clinical Pharmacist Assistant [phone]  Pharmacist Review  Adherence gaps identified?: No Drug Therapy Problems identified?: No Assessment: Controlled  5 minutes spent in review, coordination, and documentation.  Reviewed by: Alena Bills, PharmD Clinical Pharmacist [phone]

## 2021-04-01 ENCOUNTER — Ambulatory Visit: Payer: Medicare Other

## 2021-04-01 ENCOUNTER — Other Ambulatory Visit: Payer: Self-pay

## 2021-04-01 ENCOUNTER — Ambulatory Visit (INDEPENDENT_AMBULATORY_CARE_PROVIDER_SITE_OTHER): Payer: Medicare Other | Admitting: Internal Medicine

## 2021-04-01 VITALS — BP 138/80 | HR 89 | Temp 98.0°F | Resp 16 | Ht 63.0 in | Wt 168.0 lb

## 2021-04-01 DIAGNOSIS — R0602 Shortness of breath: Secondary | ICD-10-CM

## 2021-04-01 DIAGNOSIS — J4520 Mild intermittent asthma, uncomplicated: Secondary | ICD-10-CM

## 2021-04-01 DIAGNOSIS — J301 Allergic rhinitis due to pollen: Secondary | ICD-10-CM | POA: Diagnosis not present

## 2021-04-01 NOTE — Patient Instructions (Signed)

## 2021-04-01 NOTE — Progress Notes (Signed)
[facility] [place], [place] [address]  Pulmonary Sleep Medicine   Office Visit Note  Patient Name: Summer Murphy DOB: [birth year] MRN [id number]  Date of Service: 04/01/2021  Complaints/HPI: Shortness of breath. She has baseline shortness of breath but she states that lately she has noted increased in symptoms after getting her allergy shots. She states that it seems to be continuous on allergy shots. She states she has needed increase in her inhaler usage.  I reviewed her inhaler usage with her and we properly instructed her on how to use the inhalers properly.  I think she may not be getting the full benefit of the inhalers due to take technique  ROS  General: (-) fever, (-) chills, (-) night sweats, (-) weakness Skin: (-) rashes, (-) itching,. Eyes: (-) visual changes, (-) redness, (-) itching. Nose and Sinuses: (-) nasal stuffiness or itchiness, (-) postnasal drip, (-) nosebleeds, (-) sinus trouble. Mouth and Throat: (-) sore throat, (-) hoarseness. Neck: (-) swollen glands, (-) enlarged thyroid, (-) neck pain. Respiratory: - cough, (-) bloody sputum, + shortness of breath, - wheezing. Cardiovascular: - ankle swelling, (-) chest pain. Lymphatic: (-) lymph node enlargement. Neurologic: (-) numbness, (-) tingling. Psychiatric: (-) anxiety, (-) depression   Current Medication: Outpatient Encounter Medications as of 04/01/2021  Medication Sig   acetaminophen (TYLENOL) 500 MG tablet Take 2 tablets (1,000 mg total) by mouth every 6 (six) hours.   Brexpiprazole 4 MG TABS Take 4 mg by mouth daily.   busPIRone (BUSPAR) 10 MG tablet Take 10 mg by mouth 2 (two) times daily.   clonazePAM (KLONOPIN) 0.5 MG tablet Take 0.5 mg by mouth at bedtime.   COMBIVENT RESPIMAT 20-100 MCG/ACT AERS respimat INHALE 1 PUFF INTO THE LUNGS EVERY 6 HOURS   diclofenac Sodium (VOLTAREN) 1 % GEL Apply 4 g topically 4 (four) times daily. (Patient taking differently: Apply 4 g  topically 2 (two) times daily.)   diltiazem (DILACOR XR) 180 MG 24 hr capsule TAKE 1 CAPSULE(180 MG) BY MOUTH DAILY   diphenhydrAMINE (BENADRYL) 25 MG tablet Take 50 mg by mouth 2 (two) times daily as needed for allergies.   diphenoxylate-atropine (LOMOTIL) 2.5-0.025 MG tablet Take 1 tablet by mouth 3 (three) times daily as needed for diarrhea or loose stools.   DULoxetine (CYMBALTA) 60 MG capsule Take 60 mg by mouth at bedtime.   EPINEPHrine 0.3 mg/0.3 mL IJ SOAJ injection Inject 0.3 mg into the muscle as needed for anaphylaxis. (Patient not taking: Reported on 01/21/2021)   fluticasone (FLONASE) 50 MCG/ACT nasal spray Place 2 sprays into both nostrils daily.   fluticasone furoate-vilanterol (BREO ELLIPTA) 200-25 MCG/INH AEPB Inhale 1 puff into the lungs daily.   gabapentin (NEURONTIN) 600 MG tablet TAKE 1 TABLET BY MOUTH TWICE DAILY   ipratropium-albuterol (DUONEB) 0.5-2.5 (3) MG/3ML SOLN Take 3 mLs by nebulization every 4 (four) hours as needed (for shortness of breath/wheezing).   loratadine (CLARITIN) 10 MG tablet Take 1 tablet (10 mg total) by mouth daily.   metoprolol tartrate (LOPRESSOR) 25 MG tablet Take 1 tablet (25 mg total) by mouth 2 (two) times daily.   montelukast (SINGULAIR) 10 MG tablet Take 1 tablet (10 mg total) by mouth at bedtime.   nystatin (MYCOSTATIN) 100000 UNIT/ML suspension Take 5 mLs (500,000 Units total) by mouth 4 (four) times daily. (Patient not taking: Reported on 01/21/2021)   ondansetron (ZOFRAN) 4 MG tablet Take 1 tablet (4 mg total) by mouth every 6 (six) hours as needed for  nausea.   oxybutynin (DITROPAN) 5 MG tablet Take 1 tablet (5 mg total) by mouth in the morning and at bedtime.   oxyCODONE (OXY IR/ROXICODONE) 5 MG immediate release tablet Take 1-2 tablets (5-10 mg total) by mouth every 4 (four) hours as needed for moderate pain (pain score 4-6). (Patient not taking: Reported on 01/21/2021)   pantoprazole (PROTONIX) 40 MG tablet Take 40 mg by mouth 2 (two) times  daily.   rOPINIRole (REQUIP) 0.5 MG tablet Take two tablets po BID for restless legs.   sennosides-docusate sodium (SENOKOT-S) 8.6-50 MG tablet Take 2 tablets by mouth daily.   SUMAtriptan (IMITREX) 100 MG tablet Take 1 tablet (100 mg total) by mouth every 2 (two) hours as needed (for migraine headaches.). May repeat in 1 hours if headache persists or recurs.   traZODone (DESYREL) 150 MG tablet Take 150 mg by mouth at bedtime.   valACYclovir (VALTREX) 500 MG tablet Take 1 tablet (500 mg total) by mouth daily as needed (for fever blisters).   No facility-administered encounter medications on file as of 04/01/2021.    Surgical History: Past Surgical History:  Procedure Laterality Date   ABDOMINAL HYSTERECTOMY     AMPUTATION TOE Left 07/31/2016   Procedure: AMPUTATION TOE/MPJ 2nd toe;  Surgeon: Sharlotte Alamo, DPM;  Location: [facility] ORS;  Service: Podiatry;  Laterality: Left;   APPENDECTOMY  1990   BACK SURGERY     low back   BREAST SURGERY     bilateral breast reduction   CARDIAC ELECTROPHYSIOLOGY STUDY AND ABLATION     CHOLECYSTECTOMY  1990   COLONOSCOPY WITH PROPOFOL N/A 01/04/2017   Procedure: COLONOSCOPY WITH PROPOFOL;  Surgeon: Manya Silvas, MD;  Location: [facility] ENDOSCOPY;  Service: Endoscopy;  Laterality: N/A;   CORNEAL TRANSPLANT     ESOPHAGOGASTRODUODENOSCOPY (EGD) WITH PROPOFOL N/A 01/04/2017   Procedure: ESOPHAGOGASTRODUODENOSCOPY (EGD) WITH PROPOFOL;  Surgeon: Manya Silvas, MD;  Location: [facility] ENDOSCOPY;  Service: Endoscopy;  Laterality: N/A;   EXCISION BONE CYST Left 07/31/2016   Procedure: EXCISION BONE CYST/exostectomy 28124/left 2nd;  Surgeon: Sharlotte Alamo, DPM;  Location: [facility] ORS;  Service: Podiatry;  Laterality: Left;   EXTRACORPOREAL SHOCK WAVE LITHOTRIPSY Left 09/12/2015   Procedure: EXTRACORPOREAL SHOCK WAVE LITHOTRIPSY (ESWL);  Surgeon: Hollice Espy, MD;  Location: [facility] ORS;  Service: Urology;  Laterality: Left;   FRACTURE SURGERY     left foot   HARDWARE  REMOVAL Left 11/14/2020   Procedure: HARDWARE REMOVAL;  Surgeon: Hessie Knows, MD;  Location: [facility] ORS;  Service: Orthopedics;  Laterality: Left;   [place] repair     JOINT REPLACEMENT Bilateral 2013,2014   total knees   LAPAROSCOPIC HYSTERECTOMY     LITHOTRIPSY     periprosthetic supracondylar fracture of left femur  02/16/2020   [facility]   TONSILLECTOMY     TOTAL KNEE REVISION Right 11/14/2019   Procedure: Revision patella and tibial polyethylene;  Surgeon: Hessie Knows, MD;  Location: [facility] ORS;  Service: Orthopedics;  Laterality: Right;   URETEROSCOPY      Medical History: Past Medical History:  Diagnosis Date   Acid reflux    Anemia    Anxiety    Arrhythmia    treated with meds and has no current problems   Arthritis    most uncomfortable in knees   Asthma    uses several inhalers   Depression    Fever blister    Hematuria    History of kidney stones    Hypertension    Hypoglycemia  Left flank pain    Migraine    Pre-diabetes    Restless leg    Yeast vaginitis     Family History: Family History  Problem Relation Age of Onset   Hypertension Mother    Stroke Mother    Prostate cancer Father    Kidney Stones Father    Diabetes Brother    Hypertension Brother    Breast cancer Maternal Aunt    Kidney disease Neg Hx     Social History: Social History   Socioeconomic History   Marital status: Widowed    Spouse name: Not on file   Number of children: Not on file   Years of education: Not on file   Highest education level: Not on file  Occupational History   Not on file  Tobacco Use   Smoking status: Never   Smokeless tobacco: Never  Vaping Use   Vaping Use: Never used  Substance and Sexual Activity   Alcohol use: No   Drug use: No   Sexual activity: Not on file  Other Topics Concern   Not on file  Social History Narrative   Not on file   Social Determinants of Health   Financial Resource Strain: Not on file  Food Insecurity: Not on file   Transportation Needs: Not on file  Physical Activity: Not on file  Stress: Not on file  Social Connections: Not on file  Intimate Partner Violence: Not on file    Vital Signs: Blood pressure 138/80, pulse 89, temperature 98 F (36.7 C), resp. rate 16, height 5\' 3"  (1.6 m), weight 168 lb (76.2 kg), SpO2 95 %.  Examination: General Appearance: The patient is well-developed, well-nourished, and in no distress. Skin: Gross inspection of skin unremarkable. Head: normocephalic, no gross deformities. Eyes: no gross deformities noted. ENT: ears appear grossly normal no exudates. Neck: Supple. No thyromegaly. No LAD. Respiratory: no rhonchi noted at this time. Cardiovascular: Normal S1 and S2 without murmur or rub. Extremities: No cyanosis. pulses are equal. Neurologic: Alert and oriented. No involuntary movements.  LABS: Recent Results (from the past 2160 hour(s))  Iron and TIBC     Status: None   Collection Time: 01/17/21  1:58 PM  Result Value Ref Range   Iron 79 28 - 170 ug/dL   TIBC 406 250 - 450 ug/dL   Saturation Ratios 20 10.4 - 31.8 %   UIBC 327 ug/dL    Comment: Performed at [facility], [address] [place], [place] [address]  Ferritin     Status: None   Collection Time: 01/17/21  1:58 PM  Result Value Ref Range   Ferritin 42 11 - 307 ng/mL    Comment: Performed at [facility], [address] [place], [place] [address]  CBC with Differential/Platelet     Status: Abnormal   Collection Time: 01/17/21  1:58 PM  Result Value Ref Range   WBC 7.2 4.0 - 10.5 K/uL   RBC 4.53 3.87 - 5.11 MIL/uL   Hemoglobin 13.2 12.0 - 15.0 g/dL   HCT 39.9 36.0 - 46.0 %   MCV 88.1 80.0 - 100.0 fL   MCH 29.1 26.0 - 34.0 pg   MCHC 33.1 30.0 - 36.0 g/dL   RDW 15.8 (H) 11.5 - 15.5 %   Platelets 222 150 - 400 K/uL   nRBC 0.0 0.0 - 0.2 %   Neutrophils Relative % 50 %   Neutro Abs 3.7 1.7 - 7.7 K/uL   Lymphocytes Relative 37 %  Lymphs Abs 2.6 0.7 - 4.0 K/uL    Monocytes Relative 8 %   Monocytes Absolute 0.6 0.1 - 1.0 K/uL   Eosinophils Relative 4 %   Eosinophils Absolute 0.3 0.0 - 0.5 K/uL   Basophils Relative 1 %   Basophils Absolute 0.0 0.0 - 0.1 K/uL   Immature Granulocytes 0 %   Abs Immature Granulocytes 0.01 0.00 - 0.07 K/uL    Comment: Performed at [facility], [address] [place], [place] [address]    Radiology: DG C-Arm 1-60 Min  Result Date: 11/14/2020 CLINICAL DATA:  Hardware removal. EXAM: DG C-ARM 1-60 MIN; RIGHT FEMUR 2 VIEWS FLUOROSCOPY TIME:  Fluoroscopy Time:  1 minute and 6 seconds. Radiation Exposure Index (if provided by the fluoroscopic device): 12 mGy. Number of Acquired Spot Images: 1 COMPARISON:  None. FINDINGS: A single C-arm fluoroscopic image was obtained intraoperatively and submitted for post operative interpretation. This image demonstrates total knee arthroplasty with healed distal femur fracture. Per operative note, multiple screws where removed. Please see the performing provider's procedural report for further detail. IMPRESSION: Intraoperative fluoroscopy, as detailed above. Electronically Signed   By: Margaretha Sheffield M.D.   On: 11/14/2020 13:59   DG FEMUR, MIN 2 VIEWS RIGHT  Result Date: 11/14/2020 CLINICAL DATA:  Hardware removal. EXAM: DG C-ARM 1-60 MIN; RIGHT FEMUR 2 VIEWS FLUOROSCOPY TIME:  Fluoroscopy Time:  1 minute and 6 seconds. Radiation Exposure Index (if provided by the fluoroscopic device): 12 mGy. Number of Acquired Spot Images: 1 COMPARISON:  None. FINDINGS: A single C-arm fluoroscopic image was obtained intraoperatively and submitted for post operative interpretation. This image demonstrates total knee arthroplasty with healed distal femur fracture. Per operative note, multiple screws where removed. Please see the performing provider's procedural report for further detail. IMPRESSION: Intraoperative fluoroscopy, as detailed above. Electronically Signed   By: Margaretha Sheffield M.D.   On:  11/14/2020 13:59    No results found.  No results found.    Assessment and Plan: Patient Active Problem List   Diagnosis Date Noted   S/P hardware removal 11/14/2020   IDA (iron deficiency anemia) 10/18/2020   Moderate persistent asthma with acute exacerbation 08/21/2020   S/P revision of total knee, right 11/14/2019   Seasonal allergic rhinitis due to pollen 09/20/2019   Callus of foot 08/13/2019   Candidal vaginitis 08/13/2019   Other symptoms and signs involving the nervous system 08/13/2019   Encounter for general adult medical examination with abnormal findings 08/13/2019   Restless leg syndrome 04/30/2019   Type 2 diabetes mellitus with hyperglycemia ([place]) 04/02/2019   Muscle cramps 62/05/5595   Diastolic dysfunction 41/63/8453   SOB (shortness of breath) 12/14/2018   Hiatal hernia with GERD 12/14/2018   Wheezing 12/14/2018   Impaired fasting glucose 12/14/2018   COPD with acute exacerbation ([place]) 09/28/2018   Acute upper respiratory infection 08/25/2018   Recurrent sinusitis 08/15/2018   Prediabetes 08/15/2018   Nonintractable headache 08/15/2018   Dysuria 08/15/2018   Intractable migraine without status migrainosus 04/22/2018   Nausea 04/22/2018   Polyneuropathy associated with underlying disease ([place]) 04/22/2018   Midline low back pain without sciatica 04/22/2018   Routine cervical smear 04/22/2018   Essential hypertension 07/01/2017   Acute bronchitis with asthma 07/01/2017   Allergic rhinitis 07/01/2017   Moderate asthma without complication 64/68/0321   Severe recurrent major depression without psychotic features ([place]) 09/10/2014   COPD (chronic obstructive pulmonary disease) ([place]) 09/10/2014   Calculi, ureter 04/26/2013   Corneal graft malfunction 08/17/2012  Calculus of kidney 09/31/1216   Renal colic 24/46/9507   Urge incontinence 04/26/2012   Diaphragmatic hernia 10/27/2010   Barrett esophagus 07/07/2010    1. Shortness of breath She has  chronic obstructive asthma spirometry was done today reviewed with her in detail.  And she did state that the allergy shots were making her breathing a bit worse so we will hold off on her allergy shots to see if this makes any difference for her. - Spirometry with Graph  2. Allergic rhinitis due to pollen, unspecified seasonality Will hold on her allergy shots to see if this makes any difference in her breathing  3. Chronic asthma, mild intermittent, uncomplicated Her spiro seems to be improved, We will continue to monitor closely   General Counseling: I have discussed the findings of the evaluation and examination with Summer Murphy.  I have also discussed any further diagnostic evaluation thatmay be needed or ordered today. Summer Murphy verbalizes understanding of the findings of todays visit. We also reviewed her medications today and discussed drug interactions and side effects including but not limited excessive drowsiness and altered mental states. We also discussed that there is always a risk not just to her but also people around her. she has been encouraged to call the office with any questions or concerns that should arise related to todays visit.  Orders Placed This Encounter  Procedures   Spirometry with Graph    Order Specific Question:   Where should this test be performed?    Answer:   Other     Time spent: 51  I have personally obtained a history, examined the patient, evaluated laboratory and imaging results, formulated the assessment and plan and placed orders.    Allyne Gee, MD [facility] Pulmonary and Critical Care Sleep medicine

## 2021-04-03 ENCOUNTER — Ambulatory Visit: Payer: Medicare Other

## 2021-04-08 ENCOUNTER — Other Ambulatory Visit: Payer: Self-pay

## 2021-04-08 ENCOUNTER — Emergency Department
Admission: EM | Admit: 2021-04-08 | Discharge: 2021-04-08 | Disposition: A | Discharge status: Home / Self Care | Payer: Medicare Other | Attending: Emergency Medicine | Admitting: Emergency Medicine

## 2021-04-08 DIAGNOSIS — I10 Essential (primary) hypertension: Secondary | ICD-10-CM | POA: Insufficient documentation

## 2021-04-08 DIAGNOSIS — X58XXXA Exposure to other specified factors, initial encounter: Secondary | ICD-10-CM | POA: Insufficient documentation

## 2021-04-08 DIAGNOSIS — Z20822 Contact with and (suspected) exposure to covid-19: Secondary | ICD-10-CM | POA: Insufficient documentation

## 2021-04-08 DIAGNOSIS — E1165 Type 2 diabetes mellitus with hyperglycemia: Secondary | ICD-10-CM | POA: Diagnosis not present

## 2021-04-08 DIAGNOSIS — J4541 Moderate persistent asthma with (acute) exacerbation: Secondary | ICD-10-CM | POA: Diagnosis not present

## 2021-04-08 DIAGNOSIS — J441 Chronic obstructive pulmonary disease with (acute) exacerbation: Secondary | ICD-10-CM | POA: Insufficient documentation

## 2021-04-08 DIAGNOSIS — T18108A Unspecified foreign body in esophagus causing other injury, initial encounter: Secondary | ICD-10-CM | POA: Diagnosis not present

## 2021-04-08 DIAGNOSIS — T18128A Food in esophagus causing other injury, initial encounter: Secondary | ICD-10-CM | POA: Insufficient documentation

## 2021-04-08 LAB — RESP PANEL BY RT-PCR (FLU A&B, COVID) ARPGX2
Influenza A by PCR: NEGATIVE
Influenza B by PCR: NEGATIVE
SARS Coronavirus 2 by RT PCR: NEGATIVE

## 2021-04-08 MED ORDER — GLUCAGON HCL RDNA (DIAGNOSTIC) 1 MG IJ SOLR
1.0000 mg | Freq: Once | INTRAMUSCULAR | Status: AC
  Administered 2021-04-08: 1 mg via INTRAVENOUS
  Filled 2021-04-08: qty 1

## 2021-04-08 NOTE — ED Provider Notes (Signed)
[facility] Provider Note    Event Date/Time   First MD Initiated Contact with Patient 04/08/21 1516     (approximate)   History   No chief complaint on file.   HPI  Summer Murphy is a 66 y.o. female past medical history of acid reflux, Barrett's esophagus who presents with a food bolus.  Patient was eating hamburger about 30 minutes ago when she felt to get lodged in her throat.  Initially was not able to eat or drink anything.  Still feels like it is stuck.  Is able to now swallow liquids.  Says this has happened before but has never had to have an endoscopy.  Has been told she needs her esophagus dilated.    Past Medical History:  Diagnosis Date   Acid reflux    Anemia    Anxiety    Arrhythmia    treated with meds and has no current problems   Arthritis    most uncomfortable in knees   Asthma    uses several inhalers   Depression    Fever blister    Hematuria    History of kidney stones    Hypertension    Hypoglycemia    Left flank pain    Migraine    Pre-diabetes    Restless leg    Yeast vaginitis     Patient Active Problem List   Diagnosis Date Noted   S/P hardware removal 11/14/2020   IDA (iron deficiency anemia) 10/18/2020   Moderate persistent asthma with acute exacerbation 08/21/2020   S/P revision of total knee, right 11/14/2019   Seasonal allergic rhinitis due to pollen 09/20/2019   Callus of foot 08/13/2019   Candidal vaginitis 08/13/2019   Other symptoms and signs involving the nervous system 08/13/2019   Encounter for general adult medical examination with abnormal findings 08/13/2019   Restless leg syndrome 04/30/2019   Type 2 diabetes mellitus with hyperglycemia ([place]) 04/02/2019   Muscle cramps 00/93/8182   Diastolic dysfunction 99/37/1696   SOB (shortness of breath) 12/14/2018   Hiatal hernia with GERD 12/14/2018   Wheezing 12/14/2018   Impaired fasting glucose 12/14/2018   COPD with acute exacerbation ([place])  09/28/2018   Acute upper respiratory infection 08/25/2018   Recurrent sinusitis 08/15/2018   Prediabetes 08/15/2018   Nonintractable headache 08/15/2018   Dysuria 08/15/2018   Intractable migraine without status migrainosus 04/22/2018   Nausea 04/22/2018   Polyneuropathy associated with underlying disease ([place]) 04/22/2018   Midline low back pain without sciatica 04/22/2018   Routine cervical smear 04/22/2018   Essential hypertension 07/01/2017   Acute bronchitis with asthma 07/01/2017   Allergic rhinitis 07/01/2017   Moderate asthma without complication 78/93/8101   Severe recurrent major depression without psychotic features ([place]) 09/10/2014   COPD (chronic obstructive pulmonary disease) ([place]) 09/10/2014   Calculi, ureter 04/26/2013   Corneal graft malfunction 08/17/2012   Calculus of kidney 75/12/2583   Renal colic 27/78/2423   Urge incontinence 04/26/2012   Diaphragmatic hernia 10/27/2010   Barrett esophagus 07/07/2010     Physical Exam  Triage Vital Signs: ED Triage Vitals [04/08/21 1500]  Enc Vitals Group     BP (!) 145/71     Pulse Rate (!) 102     Resp 19     Temp 98.9 F (37.2 C)     Temp Source Oral     SpO2 97 %     Weight      Height  Head Circumference      Peak Flow      Pain Score 0     Pain Loc      Pain Edu?      Excl. in [place]?     Most recent vital signs: Vitals:   04/08/21 1545 04/08/21 1615  BP:    Pulse: 89 85  Resp:    Temp:    SpO2: 95% 94%     General: Awake, no distress.  CV:  Good peripheral perfusion.  Resp:  Normal effort.  Abd:  No distention.  Neuro:             Awake, Alert, Oriented x 3  Other:     ED Results / Procedures / Treatments  Labs (all labs ordered are listed, but only abnormal results are displayed) Labs Reviewed  RESP PANEL BY RT-PCR (FLU A&B, COVID) [id number]     EKG     RADIOLOGY    PROCEDURES:    MEDICATIONS ORDERED IN ED: Medications  glucagon (human recombinant) (GLUCAGEN)  injection 1 mg (1 mg Intravenous Given 04/08/21 1552)     IMPRESSION / MDM / Cool / ED COURSE  I reviewed the triage vital signs and the nursing notes.                              Differential diagnosis includes, but is not limited to, food bolus, esophageal irritation, stricture  Patient is a 66 year old female with a history of GERD and Barrett's esophagus who presents with a possible food bolus.  Was eating a hamburger about 30 minutes prior to arrival and felt to get lodged in her throat.  Initially not able to tolerate anything.  On my evaluation patient appears well.  She is tolerating her secretions and she is able to swallow water without regurgitation or vomiting.  She does still continue to feel that the piece of meat is in her esophagus.  Given she is tolerating p.o. I question whether she has actually passed this just continues to have the sensation due to irritation.  We will try glucagon and a GI cocktail.  Likely will need to discuss with GI if she continues to feel like it is there.  After glucagon patient still feels like there is something stuck.  Although she is able to tolerate liquids without any issue.  I spoke with Dr. Marius Ditch with GI who recommends that if she is able to tolerate fluids that she can be discharged on a liquid diet and follow-up tomorrow for an EGD.  If by tomorrow tolerating solids and liquids can probably wait to be seen in the office until Thursday.  I think this is a reasonable plan given she does not obviously have a complete obstruction.  Patient was agreeable.  Will discharge.     FINAL CLINICAL IMPRESSION(S) / ED DIAGNOSES   Final diagnoses:  Foreign body in esophagus, initial encounter     Rx / DC Orders   ED Discharge Orders     None        Note:  This document was prepared using Dragon voice recognition software and may include unintentional dictation errors.   Rada Hay, MD 04/08/21 1655

## 2021-04-08 NOTE — ED Notes (Signed)
Patient Alert and oriented to baseline. Stable and ambulatory to baseline. Patient verbalized understanding of the discharge instructions.  Patient belongings were taken by the patient.   

## 2021-04-08 NOTE — Discharge Instructions (Signed)
I have spoke with Dr. Marius Ditch who is the gastroenterologist who can see you tomorrow for an endoscopy.  In the meantime please only take in liquids.

## 2021-04-08 NOTE — ED Notes (Signed)
MD at the bedside  

## 2021-04-08 NOTE — ED Triage Notes (Signed)
Pt presents to ED with c/o of having a hamburger food bolus. Pt states vomiting all other liquid up after eating hamburger. Pt denies SOB.

## 2021-04-08 NOTE — ED Notes (Signed)
Pt in NAD. Reports unable to swallow all her secretions. Talking in complete sentences.

## 2021-04-09 ENCOUNTER — Encounter
Admission: RE | Disposition: A | Discharge status: Home / Self Care | Payer: Self-pay | Source: Home / Self Care | Attending: Gastroenterology

## 2021-04-09 ENCOUNTER — Encounter: Payer: Self-pay | Admitting: Gastroenterology

## 2021-04-09 ENCOUNTER — Ambulatory Visit
Admission: RE | Admit: 2021-04-09 | Discharge: 2021-04-09 | Disposition: A | Discharge status: Home / Self Care | Payer: Medicare Other | Attending: Gastroenterology | Admitting: Gastroenterology

## 2021-04-09 ENCOUNTER — Telehealth: Payer: Self-pay

## 2021-04-09 ENCOUNTER — Ambulatory Visit: Payer: Medicare Other | Admitting: Anesthesiology

## 2021-04-09 ENCOUNTER — Other Ambulatory Visit: Payer: Self-pay

## 2021-04-09 DIAGNOSIS — K219 Gastro-esophageal reflux disease without esophagitis: Secondary | ICD-10-CM | POA: Diagnosis not present

## 2021-04-09 DIAGNOSIS — E669 Obesity, unspecified: Secondary | ICD-10-CM | POA: Insufficient documentation

## 2021-04-09 DIAGNOSIS — K56699 Other intestinal obstruction unspecified as to partial versus complete obstruction: Secondary | ICD-10-CM

## 2021-04-09 DIAGNOSIS — K319 Disease of stomach and duodenum, unspecified: Secondary | ICD-10-CM | POA: Diagnosis not present

## 2021-04-09 DIAGNOSIS — E119 Type 2 diabetes mellitus without complications: Secondary | ICD-10-CM | POA: Insufficient documentation

## 2021-04-09 DIAGNOSIS — I10 Essential (primary) hypertension: Secondary | ICD-10-CM | POA: Insufficient documentation

## 2021-04-09 DIAGNOSIS — R1314 Dysphagia, pharyngoesophageal phase: Secondary | ICD-10-CM | POA: Insufficient documentation

## 2021-04-09 DIAGNOSIS — R1319 Other dysphagia: Secondary | ICD-10-CM

## 2021-04-09 DIAGNOSIS — M069 Rheumatoid arthritis, unspecified: Secondary | ICD-10-CM | POA: Insufficient documentation

## 2021-04-09 DIAGNOSIS — K449 Diaphragmatic hernia without obstruction or gangrene: Secondary | ICD-10-CM | POA: Diagnosis not present

## 2021-04-09 DIAGNOSIS — Z7951 Long term (current) use of inhaled steroids: Secondary | ICD-10-CM | POA: Insufficient documentation

## 2021-04-09 DIAGNOSIS — F419 Anxiety disorder, unspecified: Secondary | ICD-10-CM | POA: Diagnosis not present

## 2021-04-09 DIAGNOSIS — F32A Depression, unspecified: Secondary | ICD-10-CM | POA: Diagnosis not present

## 2021-04-09 DIAGNOSIS — K259 Gastric ulcer, unspecified as acute or chronic, without hemorrhage or perforation: Secondary | ICD-10-CM | POA: Diagnosis not present

## 2021-04-09 DIAGNOSIS — K222 Esophageal obstruction: Secondary | ICD-10-CM

## 2021-04-09 DIAGNOSIS — J449 Chronic obstructive pulmonary disease, unspecified: Secondary | ICD-10-CM | POA: Diagnosis not present

## 2021-04-09 HISTORY — PX: ESOPHAGOGASTRODUODENOSCOPY: SHX5428

## 2021-04-09 SURGERY — EGD (ESOPHAGOGASTRODUODENOSCOPY)
Anesthesia: General

## 2021-04-09 MED ORDER — SODIUM CHLORIDE 0.9 % IV SOLN: INTRAVENOUS | Status: DC

## 2021-04-09 MED ORDER — LIDOCAINE HCL (PF) 2 % IJ SOLN
INTRAMUSCULAR | Status: AC
  Filled 2021-04-09: qty 5

## 2021-04-09 MED ORDER — PROPOFOL 500 MG/50ML IV EMUL
INTRAVENOUS | Status: AC
  Filled 2021-04-09: qty 50

## 2021-04-09 MED ORDER — LIDOCAINE HCL (CARDIAC) PF 100 MG/5ML IV SOSY
PREFILLED_SYRINGE | INTRAVENOUS | Status: DC | PRN
Start: 2021-04-09 — End: 2021-04-09
  Administered 2021-04-09: 30 mg via INTRAVENOUS

## 2021-04-09 MED ORDER — PROPOFOL 10 MG/ML IV BOLUS
INTRAVENOUS | Status: DC | PRN
  Administered 2021-04-09: 100 mg via INTRAVENOUS
  Administered 2021-04-09: 50 mg via INTRAVENOUS

## 2021-04-09 NOTE — Anesthesia Postprocedure Evaluation (Signed)
Anesthesia Post Note  Patient: Summer Murphy  Procedure(s) Performed: ESOPHAGOGASTRODUODENOSCOPY (EGD)  Patient location during evaluation: PACU Anesthesia Type: General Level of consciousness: awake and alert, oriented and patient cooperative Pain management: pain level controlled Vital Signs Assessment: post-procedure vital signs reviewed and stable Respiratory status: spontaneous breathing, nonlabored ventilation and respiratory function stable Cardiovascular status: blood pressure returned to baseline and stable Postop Assessment: adequate PO intake Anesthetic complications: no   No notable events documented.   Last Vitals:  Vitals:   04/09/21 1254 04/09/21 1304  BP: 125/78 125/79  Pulse: 76 77  Resp: 14 13  Temp:    SpO2: 95% 97%    Last Pain:  Vitals:   04/09/21 1304  TempSrc:   PainSc: 0-No pain                 Darrin Nipper

## 2021-04-09 NOTE — Transfer of Care (Signed)
Immediate Anesthesia Transfer of Care Note  Patient: Summer Murphy  Procedure(s) Performed: ESOPHAGOGASTRODUODENOSCOPY (EGD)  Patient Location: PACU  Anesthesia Type:MAC  Level of Consciousness: awake  Airway & Oxygen Therapy: Patient Spontanous Breathing  Post-op Assessment: Report given to RN  Post vital signs: Reviewed and stable  Last Vitals:  Vitals Value Taken Time  BP 111/67 04/09/21 1234  Temp 36.4 C 04/09/21 1234  Pulse 72 04/09/21 1234  Resp 14 04/09/21 1234  SpO2 97 % 04/09/21 1234    Last Pain:  Vitals:   04/09/21 1234  TempSrc: Temporal  PainSc:          Complications: No notable events documented.

## 2021-04-09 NOTE — H&P (Signed)
Cephas Darby, MD [address]  [place]  [place], [place] [address]  Main: [phone]  Fax: [phone] Pager: [phone]  Primary Care Physician:  Lavera Guise, MD Primary Gastroenterologist:  Dr. Cephas Darby  Pre-Procedure History & Physical: HPI:  Summer Murphy is a 66 y.o. female is here for an endoscopy.   Past Medical History:  Diagnosis Date   Acid reflux    Anemia    Anxiety    Arrhythmia    treated with meds and has no current problems   Arthritis    most uncomfortable in knees   Asthma    uses several inhalers   Depression    Fever blister    Hematuria    History of kidney stones    Hypertension    Hypoglycemia    Left flank pain    Migraine    Pre-diabetes    Restless leg    Yeast vaginitis     Past Surgical History:  Procedure Laterality Date   ABDOMINAL HYSTERECTOMY     AMPUTATION TOE Left 07/31/2016   Procedure: AMPUTATION TOE/MPJ 2nd toe;  Surgeon: Sharlotte Alamo, DPM;  Location: [facility] ORS;  Service: Podiatry;  Laterality: Left;   APPENDECTOMY  1990   BACK SURGERY     low back   BREAST SURGERY     bilateral breast reduction   CARDIAC ELECTROPHYSIOLOGY STUDY AND ABLATION     CHOLECYSTECTOMY  1990   COLONOSCOPY WITH PROPOFOL N/A 01/04/2017   Procedure: COLONOSCOPY WITH PROPOFOL;  Surgeon: Manya Silvas, MD;  Location: [facility] ENDOSCOPY;  Service: Endoscopy;  Laterality: N/A;   CORNEAL TRANSPLANT     ESOPHAGOGASTRODUODENOSCOPY (EGD) WITH PROPOFOL N/A 01/04/2017   Procedure: ESOPHAGOGASTRODUODENOSCOPY (EGD) WITH PROPOFOL;  Surgeon: Manya Silvas, MD;  Location: [facility] END[facility]COPY;  Service: Endoscopy;  Laterality: N/A;   EXCISION BONE CYST Left 07/31/2016   Procedure: EXCISION BONE CYST/exostectomy 28124/left 2nd;  Surgeon: Sharlotte Alamo, DPM;  Location: [facility];  Service: Podiatry;  Laterality: Left;   EXTRACORPOREAL SHOCK WAVE LITHOTRIPSY Left 09/12/2015   Procedure: EXTRACORPOREAL SHOCK WAVE LITHOTRIPSY (ESWL);  Surgeon: Hollice Espy, MD;  Location: [facility];  Service: Urology;  Laterality: Left;   FRACTURE SURGERY     left foot   HARDWARE REMOVAL Left 11/14/2020   Procedure: HARDWARE REMOVAL;  Surgeon: Hessie Knows, MD;  Location: [facility] ORS;  Service: Orthopedics;  Laterality: Left;   [place] repair     JOINT REPLACEMENT Bilateral 2013,2014   total knees   LAPAROSCOPIC HYSTERECTOMY     LITHOTRIPSY     periprosthetic supracondylar fracture of left femur  02/16/2020   [facility]   TONSILLECTOMY     TOTAL KNEE REVISION Right 11/14/2019   Procedure: Revision patella and tibial polyethylene;  Surgeon: Hessie Knows, MD;  Location: [facility] ORS;  Service: Orthopedics;  Laterality: Right;   URETEROSCOPY      Prior to Admission medications   Medication Sig Start Date End Date Taking? Authorizing Provider  acetaminophen (TYLENOL) 500 MG tablet Take 2 tablets (1,000 mg total) by mouth every 6 (six) hours. 11/15/20  Yes Duanne Guess, PA-C  Brexpiprazole 4 MG TABS Take 4 mg by mouth daily.   Yes [provider]  busPIRone (BUSPAR) 10 MG tablet Take 10 mg by mouth 2 (two) times daily. 10/04/20  Yes [provider]  clonazePAM (KLONOPIN) 0.5 MG tablet Take 0.5 mg by mouth at bedtime.   Yes [provider]  COMBIVENT RESPIMAT 20-100 MCG/ACT AERS  respimat INHALE 1 PUFF INTO THE LUNGS EVERY 6 HOURS 12/26/20  Yes Lavera Guise, MD  diclofenac Sodium (VOLTAREN) 1 % GEL Apply 4 g topically 4 (four) times daily. Patient taking differently: Apply 4 g topically 2 (two) times daily. 08/13/20  Yes Lavera Guise, MD  diltiazem (DILACOR XR) 180 MG 24 hr capsule TAKE 1 CAPSULE(180 MG) BY MOUTH DAILY 03/08/21  Yes Abernathy, Alyssa, NP  diphenhydrAMINE (BENADRYL) 25 MG tablet Take 50 mg by mouth 2 (two) times daily as needed for allergies.   Yes [provider]  diphenoxylate-atropine (LOMOTIL) 2.5-0.025 MG tablet Take 1 tablet by mouth 3 (three) times daily as needed for diarrhea or loose stools. 05/01/19  Yes  Boscia, Greer Ee, NP  DULoxetine (CYMBALTA) 60 MG capsule Take 60 mg by mouth at bedtime.   Yes [provider]  loratadine (CLARITIN) 10 MG tablet Take 1 tablet (10 mg total) by mouth daily. 06/06/20  Yes Luiz Ochoa, NP  metoprolol tartrate (LOPRESSOR) 25 MG tablet Take 1 tablet (25 mg total) by mouth 2 (two) times daily. 10/14/20  Yes Lavera Guise, MD  montelukast (SINGULAIR) 10 MG tablet Take 1 tablet (10 mg total) by mouth at bedtime. 09/16/20  Yes Lavera Guise, MD  oxybutynin (DITROPAN) 5 MG tablet Take 1 tablet (5 mg total) by mouth in the morning and at bedtime. 08/13/20  Yes Lavera Guise, MD  pantoprazole (PROTONIX) 40 MG tablet Take 40 mg by mouth 2 (two) times daily.   Yes [provider]  rOPINIRole (REQUIP) 0.5 MG tablet Take two tablets po BID for restless legs. 08/13/20  Yes Lavera Guise, MD  EPINEPHrine 0.3 mg/0.3 mL IJ SOAJ injection Inject 0.3 mg into the muscle as needed for anaphylaxis. 12/18/20   Lavera Guise, MD  fluticasone [facility]) 50 MCG/ACT nasal spray Place 2 sprays into both nostrils daily. 09/16/20   Lavera Guise, MD  fluticasone furoate-vilanterol (BREO ELLIPTA) 200-25 MCG/INH AEPB Inhale 1 puff into the lungs daily. 08/13/20   Lavera Guise, MD  gabapentin (NEURONTIN) 600 MG tablet TAKE 1 TABLET BY MOUTH TWICE DAILY 02/09/21   Jonetta Osgood, NP  ipratropium-albuterol (DUONEB) 0.5-2.5 (3) MG/3ML SOLN Take 3 mLs by nebulization every 4 (four) hours as needed (for shortness of breath/wheezing). 01/25/19   Kendell Bane, NP  nystatin (MYCOSTATIN) 100000 UNIT/ML suspension Take 5 mLs (500,000 Units total) by mouth 4 (four) times daily. Patient not taking: Reported on 01/21/2021 06/06/20   Luiz Ochoa, NP  ondansetron (ZOFRAN) 4 MG tablet Take 1 tablet (4 mg total) by mouth every 6 (six) hours as needed for nausea. 11/15/19   Duanne Guess, PA-C  oxyCODONE (OXY IR/ROXICODONE) 5 MG immediate release tablet Take 1-2 tablets (5-10 mg total) by  mouth every 4 (four) hours as needed for moderate pain (pain score 4-6). Patient not taking: Reported on 01/21/2021 11/15/20   Duanne Guess, PA-C  sennosides-docusate sodium (SENOKOT-S) 8.6-50 MG tablet Take 2 tablets by mouth daily. 11/15/20   Duanne Guess, PA-C  SUMAtriptan (IMITREX) 100 MG tablet Take 1 tablet (100 mg total) by mouth every 2 (two) hours as needed (for migraine headaches.). May repeat in 1 hours if headache persists or recurs. 08/13/20   Lavera Guise, MD  traZODone (DESYREL) 150 MG tablet Take 150 mg by mouth at bedtime.    [provider]  valACYclovir (VALTREX) 500 MG tablet Take 1 tablet (500 mg total) by mouth daily as  needed (for fever blisters). 08/13/20   Lavera Guise, MD    Allergies as of 04/08/2021 - Review Complete 04/08/2021  Allergen Reaction Noted   Librium [chlordiazepoxide] Shortness Of Breath 08/28/2014   Metoclopramide Hives and Other (See Comments) 08/28/2014   Vanilla Shortness Of Breath 09/10/2014   Aspirin Hives 08/28/2014   Nsaids Rash 11/21/2014   Azithromycin Other (See Comments) 11/21/2014   Buprenorphine hcl Other (See Comments) 09/09/2015   Chlordiazepoxide hcl Other (See Comments) 11/21/2014   Morphine Other (See Comments) 08/28/2014   Sulfa antibiotics  11/21/2014   Tolmetin  09/09/2015   Amlodipine besylate Itching and Rash 05/10/2018   Iron Nausea And Vomiting 11/21/2014    Family History  Problem Relation Age of Onset   Hypertension Mother    Stroke Mother    Prostate cancer Father    Kidney Stones Father    Diabetes Brother    Hypertension Brother    Breast cancer Maternal Aunt    Kidney disease Neg Hx     Social History   Socioeconomic History   Marital status: Widowed    Spouse name: Not on file   Number of children: Not on file   Years of education: Not on file   Highest education level: Not on file  Occupational History   Not on file  Tobacco Use   Smoking status: Never   Smokeless tobacco: Never   Vaping Use   Vaping Use: Never used  Substance and Sexual Activity   Alcohol use: No   Drug use: No   Sexual activity: Not on file  Other Topics Concern   Not on file  Social History Narrative   Not on file   Social Determinants of Health   Financial Resource Strain: Not on file  Food Insecurity: Not on file  Transportation Needs: Not on file  Physical Activity: Not on file  Stress: Not on file  Social Connections: Not on file  Intimate Partner Violence: Not on file    Review of Systems: See HPI, otherwise negative ROS  Physical Exam: BP (!) 147/77    Pulse 91    Temp 99.1 F (37.3 C) (Temporal)    Resp 18    Ht 5\' 3"  (1.6 m)    Wt 76.4 kg    SpO2 97%    BMI 29.82 kg/m  General:   Alert,  pleasant and cooperative in NAD Head:  Normocephalic and atraumatic. Neck:  Supple; no masses or thyromegaly. Lungs:  Clear throughout to auscultation.    Heart:  Regular rate and rhythm. Abdomen:  Soft, nontender and nondistended. Normal bowel sounds, without guarding, and without rebound.   Neurologic:  Alert and  oriented x4;  grossly normal neurologically.  Impression/Plan: Summer Murphy is here for an endoscopy to be performed for dysphagia  Risks, benefits, limitations, and alternatives regarding  endoscopy have been reviewed with the patient.  Questions have been answered.  All parties agreeable.   Sherri Sear, MD  04/09/2021, 11:41 AM

## 2021-04-09 NOTE — Telephone Encounter (Signed)
Lvm to schedule ED follow up appointment-Summer Murphy

## 2021-04-09 NOTE — Op Note (Signed)
[facility] Gastroenterology Patient Name: Summer Murphy Procedure Date: 04/09/2021 12:00 PM MRN: [id number] Account #: [id number] Date of Birth: 1955-06-19 Admit Type: Outpatient Age: 66 Room: [facility] ENDO ROOM 4 Gender: Female Note Status: Finalized Instrument Name: Michaelle Birks [id number] Procedure:             Upper GI endoscopy Indications:           Esophageal dysphagia Providers:             Lin Landsman MD, MD Medicines:             General Anesthesia Complications:         No immediate complications. Estimated blood loss: None. Procedure:             Pre-Anesthesia Assessment:                        - Prior to the procedure, a History and Physical was                         performed, and patient medications and allergies were                         reviewed. The patient is competent. The risks and                         benefits of the procedure and the sedation options and                         risks were discussed with the patient. All questions                         were answered and informed consent was obtained.                         Patient identification and proposed procedure were                         verified by the physician, the nurse, the                         anesthesiologist, the anesthetist and the technician                         in the pre-procedure area in the procedure room in the                         endoscopy suite. Mental Status Examination: alert and                         oriented. Airway Examination: normal oropharyngeal                         airway and neck mobility. Respiratory Examination:                         clear to auscultation. CV Examination: normal.                         Prophylactic Antibiotics: The patient  does not require                         prophylactic antibiotics. Prior Anticoagulants: The                         patient has taken no previous anticoagulant or                          antiplatelet agents. ASA Grade Assessment: III - A                         patient with severe systemic disease. After reviewing                         the risks and benefits, the patient was deemed in                         satisfactory condition to undergo the procedure. The                         anesthesia plan was to use general anesthesia.                         Immediately prior to administration of medications,                         the patient was re-assessed for adequacy to receive                         sedatives. The heart rate, respiratory rate, oxygen                         saturations, blood pressure, adequacy of pulmonary                         ventilation, and response to care were monitored                         throughout the procedure. The physical status of the                         patient was re-assessed after the procedure.                        After obtaining informed consent, the endoscope was                         passed under direct vision. Throughout the procedure,                         the patient's blood pressure, pulse, and oxygen                         saturations were monitored continuously. The Endoscope                         was introduced through the mouth, and advanced to the  second part of duodenum. The upper GI endoscopy was                         accomplished without difficulty. The patient tolerated                         the procedure well. Findings:      The duodenal bulb and second portion of the duodenum were normal.      Evidence of a partial fundoplication was found in the gastric fundus       from prior history of hiatal hernia repair. The wrap appeared loose.       This was traversed.      Multiple dispersed small erosions with stigmata of recent bleeding were       found in the gastric fundus on retroflexed view.      The gastric body, incisura and gastric antrum were normal.       Esophagogastric landmarks were identified: the gastroesophageal junction       was found at 29 cm from the incisors.      One benign-appearing, intrinsic mild (non-circumferential scarring)       stenosis was found 29 cm from the incisors. This stenosis measured less       than one cm (in length). The stenosis was traversed. The dilation site       was examined following endoscope reinsertion and showed mild mucosal       disruption and complete resolution of luminal narrowing. Estimated blood       loss was minimal. Impression:            - Normal duodenal bulb and second portion of the                         duodenum.                        - A partial fundoplication was found. The wrap appears                         loose.                        - Erosive gastropathy with stigmata of recent bleeding.                        - Normal gastric body, incisura and antrum.                        - Esophagogastric landmarks identified.                        - Benign-appearing esophageal stenosis.                        - No specimens collected. Recommendation:        - Discharge patient to home (with escort).                        - Chopped diet and mechanical soft diet.                        - Continue present medications.                        -  Follow an antireflux regimen indefinitely.                        - Use a proton pump inhibitor PO BID indefinitely. Procedure Code(s):     --- Professional ---                        [phone], Esophagogastroduodenoscopy, flexible,                         transoral; diagnostic, including collection of                         specimen(s) by brushing or washing, when performed                         (separate procedure) Diagnosis Code(s):     --- Professional ---                        T01.601, Other specified postprocedural states                        K92.2, Gastrointestinal hemorrhage, unspecified                        K22.2, Esophageal  obstruction                        R13.14, Dysphagia, pharyngoesophageal phase CPT copyright 2019 [employer]. All rights reserved. The codes documented in this report are preliminary and upon coder review may  be revised to meet current compliance requirements. Dr. Ulyess Mort Lin Landsman MD, MD 04/09/2021 12:38:32 PM This report has been signed electronically. Number of Addenda: 0 Note Initiated On: 04/09/2021 12:00 PM Estimated Blood Loss:  Estimated blood loss was minimal.      [facility]

## 2021-04-09 NOTE — Anesthesia Preprocedure Evaluation (Signed)
Anesthesia Evaluation  Patient identified by MRN, date of birth, ID band Patient awake    Reviewed: Allergy & Precautions, NPO status , Patient's Chart, lab work & pertinent test results  History of Anesthesia Complications Negative for: history of anesthetic complications  Airway Mallampati: IV   Neck ROM: Full    Dental   Many missing teeth:   Pulmonary asthma , COPD,    Pulmonary exam normal breath sounds clear to auscultation       Cardiovascular hypertension, Normal cardiovascular exam Rhythm:Regular Rate:Normal  ECG 10/08/20: Sinus rhythm, LBBB, LAD   Neuro/Psych  Headaches, PSYCHIATRIC DISORDERS Anxiety Depression    GI/Hepatic GERD  ,  Endo/Other  diabetes, Type 2Obesity   Renal/GU Renal disease (nephrolithiasis)     Musculoskeletal  (+) Arthritis , Rheumatoid disorders,    Abdominal   Peds  Hematology negative hematology ROS (+)   Anesthesia Other Findings   Reproductive/Obstetrics                             Anesthesia Physical Anesthesia Plan  ASA: 3  Anesthesia Plan: General   Post-op Pain Management:    Induction: Intravenous  PONV Risk Score and Plan: 3 and Propofol infusion, TIVA and Treatment may vary due to age or medical condition  Airway Management Planned: Natural Airway  Additional Equipment:   Intra-op Plan:   Post-operative Plan:   Informed Consent: I have reviewed the patients History and Physical, chart, labs and discussed the procedure including the risks, benefits and alternatives for the proposed anesthesia with the patient or authorized representative who has indicated his/her understanding and acceptance.       Plan Discussed with: CRNA  Anesthesia Plan Comments: (LMA/GETA backup discussed.  Patient consented for risks of anesthesia including but not limited to:  - adverse reactions to medications - damage to eyes, teeth, lips or other  oral mucosa - nerve damage due to positioning  - sore throat or hoarseness - damage to heart, brain, nerves, lungs, other parts of body or loss of life  Informed patient about role of CRNA in peri- and intra-operative care.  Patient voiced understanding.)        Anesthesia Quick Evaluation

## 2021-04-10 ENCOUNTER — Telehealth: Payer: Self-pay

## 2021-04-10 ENCOUNTER — Encounter: Payer: Self-pay | Admitting: Gastroenterology

## 2021-04-10 ENCOUNTER — Encounter: Payer: Self-pay | Admitting: Oncology

## 2021-04-10 NOTE — Telephone Encounter (Signed)
Lvm to scheduled ED follow up-Summer Murphy 

## 2021-04-15 ENCOUNTER — Encounter: Payer: Self-pay | Admitting: Oncology

## 2021-04-15 ENCOUNTER — Ambulatory Visit (INDEPENDENT_AMBULATORY_CARE_PROVIDER_SITE_OTHER): Payer: Medicare Other | Admitting: Internal Medicine

## 2021-04-15 ENCOUNTER — Encounter: Payer: Self-pay | Admitting: Internal Medicine

## 2021-04-15 ENCOUNTER — Telehealth: Payer: Self-pay

## 2021-04-15 ENCOUNTER — Other Ambulatory Visit: Payer: Self-pay

## 2021-04-15 VITALS — BP 126/72 | HR 95 | Temp 98.0°F | Resp 16 | Ht 63.0 in | Wt 162.8 lb

## 2021-04-15 DIAGNOSIS — R011 Cardiac murmur, unspecified: Secondary | ICD-10-CM | POA: Diagnosis not present

## 2021-04-15 DIAGNOSIS — K2100 Gastro-esophageal reflux disease with esophagitis, without bleeding: Secondary | ICD-10-CM | POA: Diagnosis not present

## 2021-04-15 DIAGNOSIS — R112 Nausea with vomiting, unspecified: Secondary | ICD-10-CM

## 2021-04-15 DIAGNOSIS — K449 Diaphragmatic hernia without obstruction or gangrene: Secondary | ICD-10-CM | POA: Diagnosis not present

## 2021-04-15 MED ORDER — ONDANSETRON HCL 4 MG PO TABS: ORAL_TABLET | ORAL | 1 refills | Status: DC

## 2021-04-15 NOTE — Progress Notes (Signed)
[facility] [place], [place] [address]  Internal MEDICINE  Office Visit Note  Patient Name: Summer Murphy  [id number]  [id number]  Date of Service: 04/28/2021  Chief Complaint  Patient presents with   Follow-up    Went to the ED bc of food blockage in throat 04/09/2021   Hypertension   Gastroesophageal Reflux   Depression    HPI Pt is here after ED visit. She went to ED when part of her hamburger was stuck in his esophagus. She does have h/o GERD and hiatal hernia repair, recent EGD shows might need revisiting at hiatal hernia repair site  She is c/o nausea, denies any chest pain  Denies any fever or chills  Asthma is well controlled as well  Current Medication: Outpatient Encounter Medications as of 04/15/2021  Medication Sig   acetaminophen (TYLENOL) 500 MG tablet Take 2 tablets (1,000 mg total) by mouth every 6 (six) hours.   busPIRone (BUSPAR) 10 MG tablet Take 10 mg by mouth 2 (two) times daily.   clonazePAM (KLONOPIN) 0.5 MG tablet Take 0.5 mg by mouth at bedtime.   COMBIVENT RESPIMAT 20-100 MCG/ACT AERS respimat INHALE 1 PUFF INTO THE LUNGS EVERY 6 HOURS   diclofenac Sodium (VOLTAREN) 1 % GEL Apply 4 g topically 4 (four) times daily. (Patient taking differently: Apply 4 g topically 2 (two) times daily.)   diltiazem (DILACOR XR) 180 MG 24 hr capsule TAKE 1 CAPSULE(180 MG) BY MOUTH DAILY   diphenhydrAMINE (BENADRYL) 25 MG tablet Take 50 mg by mouth 2 (two) times daily as needed for allergies.   diphenoxylate-atropine (LOMOTIL) 2.5-0.025 MG tablet Take 1 tablet by mouth 3 (three) times daily as needed for diarrhea or loose stools.   DULoxetine (CYMBALTA) 60 MG capsule Take 60 mg by mouth at bedtime.   EPINEPHrine 0.3 mg/0.3 mL IJ SOAJ injection Inject 0.3 mg into the muscle as needed for anaphylaxis.   fluticasone (FLONASE) 50 MCG/ACT nasal spray Place 2 sprays into both nostrils daily.   fluticasone furoate-vilanterol (BREO ELLIPTA) 200-25 MCG/INH  AEPB Inhale 1 puff into the lungs daily.   gabapentin (NEURONTIN) 600 MG tablet TAKE 1 TABLET BY MOUTH TWICE DAILY   ipratropium-albuterol (DUONEB) 0.5-2.5 (3) MG/3ML SOLN Take 3 mLs by nebulization every 4 (four) hours as needed (for shortness of breath/wheezing).   loratadine (CLARITIN) 10 MG tablet Take 1 tablet (10 mg total) by mouth daily.   metoprolol tartrate (LOPRESSOR) 25 MG tablet Take 1 tablet (25 mg total) by mouth 2 (two) times daily.   montelukast (SINGULAIR) 10 MG tablet Take 1 tablet (10 mg total) by mouth at bedtime.   oxybutynin (DITROPAN) 5 MG tablet Take 1 tablet (5 mg total) by mouth in the morning and at bedtime.   pantoprazole (PROTONIX) 40 MG tablet Take 40 mg by mouth 2 (two) times daily.   rOPINIRole (REQUIP) 0.5 MG tablet Take two tablets po BID for restless legs.   sennosides-docusate sodium (SENOKOT-S) 8.6-50 MG tablet Take 2 tablets by mouth daily.   SUMAtriptan (IMITREX) 100 MG tablet Take 1 tablet (100 mg total) by mouth every 2 (two) hours as needed (for migraine headaches.). May repeat in 1 hours if headache persists or recurs.   traZODone (DESYREL) 150 MG tablet Take 150 mg by mouth at bedtime.   valACYclovir (VALTREX) 500 MG tablet Take 1 tablet (500 mg total) by mouth daily as needed (for fever blisters).   [DISCONTINUED] ondansetron (ZOFRAN) 4 MG tablet Take 1 tablet (4 mg  total) by mouth every 6 (six) hours as needed for nausea.   ondansetron (ZOFRAN) 4 MG tablet Take one tab 2 x aday as needed   [DISCONTINUED] ondansetron (ZOFRAN) 4 MG tablet Take one tab 2 x aday as needed   No facility-administered encounter medications on file as of 04/15/2021.    Surgical History: Past Surgical History:  Procedure Laterality Date   ABDOMINAL HYSTERECTOMY     AMPUTATION TOE Left 07/31/2016   Procedure: AMPUTATION TOE/MPJ 2nd toe;  Surgeon: Sharlotte Alamo, DPM;  Location: [facility] ORS;  Service: Podiatry;  Laterality: Left;   APPENDECTOMY  1990   BACK SURGERY     low back    BREAST SURGERY     bilateral breast reduction   CARDIAC ELECTROPHYSIOLOGY STUDY AND ABLATION     CHOLECYSTECTOMY  1990   COLONOSCOPY WITH PROPOFOL N/A 01/04/2017   Procedure: COLONOSCOPY WITH PROPOFOL;  Surgeon: Manya Silvas, MD;  Location: [facility] ENDOSCOPY;  Service: Endoscopy;  Laterality: N/A;   CORNEAL TRANSPLANT     ESOPHAGOGASTRODUODENOSCOPY N/A 04/09/2021   Procedure: ESOPHAGOGASTRODUODENOSCOPY (EGD);  Surgeon: Lin Landsman, MD;  Location: [facility] ENDOSCOPY;  Service: Gastroenterology;  Laterality: N/A;   ESOPHAGOGASTRODUODENOSCOPY (EGD) WITH PROPOFOL N/A 01/04/2017   Procedure: ESOPHAGOGASTRODUODENOSCOPY (EGD) WITH PROPOFOL;  Surgeon: Manya Silvas, MD;  Location: [facility] ENDOSCOPY;  Service: Endoscopy;  Laterality: N/A;   EXCISION BONE CYST Left 07/31/2016   Procedure: EXCISION BONE CYST/exostectomy 28124/left 2nd;  Surgeon: Sharlotte Alamo, DPM;  Location: [facility] ORS;  Service: Podiatry;  Laterality: Left;   EXTRACORPOREAL SHOCK WAVE LITHOTRIPSY Left 09/12/2015   Procedure: EXTRACORPOREAL SHOCK WAVE LITHOTRIPSY (ESWL);  Surgeon: Hollice Espy, MD;  Location: [facility] ORS;  Service: Urology;  Laterality: Left;   FRACTURE SURGERY     left foot   HARDWARE REMOVAL Left 11/14/2020   Procedure: HARDWARE REMOVAL;  Surgeon: Hessie Knows, MD;  Location: [facility] ORS;  Service: Orthopedics;  Laterality: Left;   [place] repair     JOINT REPLACEMENT Bilateral 2013,2014   total knees   LAPAROSCOPIC HYSTERECTOMY     LITHOTRIPSY     periprosthetic supracondylar fracture of left femur  02/16/2020   [facility]   TONSILLECTOMY     TOTAL KNEE REVISION Right 11/14/2019   Procedure: Revision patella and tibial polyethylene;  Surgeon: Hessie Knows, MD;  Location: [facility] ORS;  Service: Orthopedics;  Laterality: Right;   URETEROSCOPY      Medical History: Past Medical History:  Diagnosis Date   Acid reflux    Anemia    Anxiety    Arrhythmia    treated with meds and has no current problems    Arthritis    most uncomfortable in knees   Asthma    uses several inhalers   Depression    Fever blister    Hematuria    History of kidney stones    Hypertension    Hypoglycemia    Left flank pain    Migraine    Pre-diabetes    Restless leg    Yeast vaginitis     Family History: Family History  Problem Relation Age of Onset   Hypertension Mother    Stroke Mother    Prostate cancer Father    Kidney Stones Father    Diabetes Brother    Hypertension Brother    Breast cancer Maternal Aunt    Kidney disease Neg Hx     Social History   Socioeconomic History   Marital status: Widowed    Spouse name: Not on  file   Number of children: Not on file   Years of education: Not on file   Highest education level: Not on file  Occupational History   Not on file  Tobacco Use   Smoking status: Never   Smokeless tobacco: Never  Vaping Use   Vaping Use: Never used  Substance and Sexual Activity   Alcohol use: No   Drug use: No   Sexual activity: Not on file  Other Topics Concern   Not on file  Social History Narrative   Not on file   Social Determinants of Health   Financial Resource Strain: Not on file  Food Insecurity: Not on file  Transportation Needs: Not on file  Physical Activity: Not on file  Stress: Not on file  Social Connections: Not on file  Intimate Partner Violence: Not on file      Review of Systems  Constitutional:  Negative for fatigue and fever.  HENT:  Negative for congestion, mouth sores and postnasal drip.   Respiratory:  Negative for cough.   Cardiovascular:  Negative for chest pain.  Gastrointestinal:  Positive for nausea.  Genitourinary:  Negative for flank pain.  Musculoskeletal:  Positive for arthralgias.  Allergic/Immunologic: Positive for environmental allergies.  Psychiatric/Behavioral: Negative.     Vital Signs: BP 126/72    Pulse 95    Temp 98 F (36.7 C)    Resp 16    Ht 5\' 3"  (1.6 m)    Wt 162 lb 12.8 oz (73.8 kg)    SpO2 95%     BMI 28.84 kg/m    Physical Exam Constitutional:      Appearance: Normal appearance.  HENT:     Head: Normocephalic and atraumatic.     Nose: Nose normal.     Mouth/Throat:     Mouth: Mucous membranes are moist.     Pharynx: No posterior oropharyngeal erythema.  Eyes:     Extraocular Movements: Extraocular movements intact.     Pupils: Pupils are equal, round, and reactive to light.  Cardiovascular:     Pulses: Normal pulses.     Heart sounds: Murmur heard.  Pulmonary:     Effort: Pulmonary effort is normal.     Breath sounds: Normal breath sounds.  Neurological:     General: No focal deficit present.     Mental Status: She is alert.  Psychiatric:        Mood and Affect: Mood normal.        Behavior: Behavior normal.       Assessment/Plan: 1. Nausea and vomiting, unspecified vomiting type Ongoing Nausea after esophageal obstruction of food and removal, will need to see surgery  - Ambulatory referral to General Surgery - ondansetron (ZOFRAN) 4 MG tablet; Take one tab 2 x aday as needed  Dispense: 45 tablet; Refill: 1  2. Hiatal hernia with gastroesophageal reflux disease and esophagitis Will need repair  - Ambulatory referral to General Surgery - ondansetron (ZOFRAN) 4 MG tablet; Take one tab 2 x aday as needed  Dispense: 45 tablet; Refill: 1  3. Murmur, cardiac New onset murmur, will order echo  - ECHOCARDIOGRAM COMPLETE; Future   General Counseling: Summer Murphy verbalizes understanding of the findings of todays visit and agrees with plan of treatment. I have discussed any further diagnostic evaluation that may be needed or ordered today. We also reviewed her medications today. she has been encouraged to call the office with any questions or concerns that should arise related to todays visit.  Orders Placed This Encounter  Procedures   Ambulatory referral to General Surgery   ECHOCARDIOGRAM COMPLETE    Meds ordered this encounter  Medications   DISCONTD:  ondansetron (ZOFRAN) 4 MG tablet    Sig: Take one tab 2 x aday as needed    Dispense:  45 tablet    Refill:  1   ondansetron (ZOFRAN) 4 MG tablet    Sig: Take one tab 2 x aday as needed    Dispense:  45 tablet    Refill:  1    Total time spent:30 Minutes Time spent includes review of chart, medications, test results, and follow up plan with the patient.   [place] Controlled Substance Database was reviewed by me.   Dr Lavera Guise Internal medicine

## 2021-04-15 NOTE — Telephone Encounter (Signed)
Awaiting 04/15/21 office notes for general surgery referral-Toni

## 2021-04-16 ENCOUNTER — Ambulatory Visit: Payer: Medicare Other

## 2021-04-16 DIAGNOSIS — R011 Cardiac murmur, unspecified: Secondary | ICD-10-CM | POA: Diagnosis not present

## 2021-04-20 DIAGNOSIS — J449 Chronic obstructive pulmonary disease, unspecified: Secondary | ICD-10-CM | POA: Diagnosis not present

## 2021-04-20 DIAGNOSIS — I10 Essential (primary) hypertension: Secondary | ICD-10-CM | POA: Diagnosis not present

## 2021-04-22 ENCOUNTER — Telehealth: Payer: Self-pay | Admitting: Student-PharmD

## 2021-04-22 NOTE — Chronic Care Management (AMB) (Signed)
Chronic Care Management   Outreach Note  04/22/2021 Name: Summer Murphy MRN: [id number] DOB: 1955-06-29  Referred by: Lavera Guise, MD Reason for referral : Chronic Care Management (RPM Monthly Review)      Irving Copas  Name: Summer Murphy  Date of Birth: 09/16/1955  Gender: Female  Primary provider: Clayborn Bigness  RPM Coordinator: Charlann Lange  ICD Code: I10 Device ID: [id number] [place]: iBP Measurement Type: blood pressure Days of Monitoring: 18 Documentation Time: 96 Min 0 Sec BLOOD PRESSURE  Measurement Date: 04/21/2021 Monday at 22:63 AM Systolic / Diastolic (mmHg): 335 / 73 Measurement Date: 04/19/2021 Saturday at 45:62 PM Systolic / Diastolic (mmHg): 563 / 87 Measurement Date: 04/17/2021 Thursday at 89:37 PM Systolic / Diastolic (mmHg): 342 / 74 Measurement Date: 04/16/2021 Wednesday at 87:68 PM Systolic / Diastolic (mmHg): 115 / 81 Measurement Date: 04/14/2021 Monday at 72:62 PM Systolic / Diastolic (mmHg): 035 / 72 Measurement Date: 04/10/2021 Thursday at 59:74 AM Systolic / Diastolic (mmHg): 163 / 75 Measurement Date: 04/09/2021 Wednesday at 84:53 AM Systolic / Diastolic (mmHg): 646 / 81 Measurement Date: 04/08/2021 Tuesday at 80:32 PM Systolic / Diastolic (mmHg): 122 / 79 Measurement Date: 04/07/2021 Monday at 48:25 PM Systolic / Diastolic (mmHg): 003 / 63 Measurement Date: 04/03/2021 Thursday at 70:48 PM Systolic / Diastolic (mmHg): 889 / 66 Measurement Date: 04/02/2021 Wednesday at 16:94 PM Systolic / Diastolic (mmHg): 503 / 93 Measurement Date: 04/01/2021 Tuesday at 88:82 PM Systolic / Diastolic (mmHg): 800 / 87 Measurement Date: 03/31/2021 Monday at 34:91 AM Systolic / Diastolic (mmHg): 791 / 88 Measurement Date: 03/29/2021 Saturday at 50:56 PM Systolic / Diastolic (mmHg): 979 / 75 Measurement Date: 03/28/2021 Friday at 48:01 PM Systolic / Diastolic (mmHg): 655 / 68 Measurement Date: 03/27/2021 Thursday at 37:48 PM Systolic / Diastolic  (mmHg): 270 / 71 Measurement Date: 03/25/2021 Tuesday at 78:67 PM Systolic / Diastolic (mmHg): 544 / 71 Measurement Date: 03/24/2021 Monday at 92:01 AM Systolic / Diastolic (mmHg): 007 / 93  PULSE  Measurement Date: 04/21/2021 Monday at 11:27 AM Pulse (IN BPM): 97 Measurement Date: 04/19/2021 Saturday at 06:47 PM Pulse (IN BPM): 89 Measurement Date: 04/17/2021 Thursday at 06:05 PM Pulse (IN BPM): 93 Measurement Date: 04/16/2021 Wednesday at 04:05 PM Pulse (IN BPM): 100 Measurement Date: 04/14/2021 Monday at 04:38 PM Pulse (IN BPM): 86 Measurement Date: 04/10/2021 Thursday at 09:13 AM Pulse (IN BPM): 90 Measurement Date: 04/09/2021 Wednesday at 09:17 AM Pulse (IN BPM): 93 Measurement Date: 04/08/2021 Tuesday at 07:48 PM Pulse (IN BPM): 82 Measurement Date: 04/07/2021 Monday at 06:32 PM Pulse (IN BPM): 106 Measurement Date: 04/03/2021 Thursday at 11:04 PM Pulse (IN BPM): 103 Measurement Date: 04/02/2021 Wednesday at 05:47 PM Pulse (IN BPM): 81 Measurement Date: 04/01/2021 Tuesday at 02:24 PM Pulse (IN BPM): 94 Measurement Date: 03/31/2021 Monday at 10:19 AM Pulse (IN BPM): 94 Measurement Date: 03/29/2021 Saturday at 10:16 PM Pulse (IN BPM): 96 Measurement Date: 03/28/2021 Friday at 03:35 PM Pulse (IN BPM): 88 Measurement Date: 03/27/2021 Thursday at 01:34 PM Pulse (IN BPM): 98 Measurement Date: 03/25/2021 Tuesday at 10:21 PM Pulse (IN BPM): 89 Measurement Date: 03/24/2021 Monday at 09:26 AM Pulse (IN BPM): 102        Notes for January-2023 : 34 Min 0 Sec  Date and Time: 04/22/2021 Tuesday at 10:19 AM User: Alena Bills Time logged: 04:00 Notes: DATA REVIEW SYSTOLIC BP  Automatically transmitted data is reviewed today from device. (ID: [id number]).  Systolic BP reading is >641 for 0 % of time  Systolic BP reading is between 160 to 179 for 0 % of time  Systolic BP reading is between 140 to 159 for 38.89 % of time  Systolic BP reading is between 120-139 for 50.0 %  of time  Systolic BP reading is between 101-120 for 11.11 % of time  Systolic BP reading is <062 for 0 % of time  DIASTOLIC BP  Diastolic BP reading is elevated >100 for 0 % of time  Diastolic BP reading is between 90-99 for 11.11 % of time  PULSE  Pulse is >120 for 0 % of time  Pulse is <50 for 0 % of time  COMPLIANCE NOTES BLOOD PRESSURE  Patient is compliant - 16 days of readings obtained.  MANAGEMENT NOTES We will continue to monitor BP readings for now  Medications for BP reviewed for this patient  No change on the treatment plan   Date and Time: 04/21/2021 Monday at 11:00 AM User: Charlann Lange Time logged: 04:00 Notes: REVIEW NOTES All the daily Blood pressure data noted since last review  Heart rate/Pulse data reviewed   Date and Time: 04/17/2021 Thursday at 03:00 PM User: Charlann Lange Time logged: 02:00 Notes: REVIEW NOTES All the daily Blood pressure data noted since last review  Heart rate/Pulse data reviewed   Date and Time: 04/16/2021 Wednesday at 02:32 PM User: Charlann Lange Time logged: 02:00 Notes: REVIEW NOTES All the daily Blood pressure data noted since last review  Heart rate/Pulse data reviewed   Date and Time: 04/11/2021 Friday at 03:53 PM User: Charlann Lange Time logged: 02:00 Notes: REVIEW NOTES All the daily Blood pressure data noted since last review  Heart rate/Pulse data reviewed   Date and Time: 04/09/2021 Wednesday at 03:43 PM User: Charlann Lange Time logged: 06:00 Notes: REVIEW NOTES All the daily Blood pressure data noted since last review  Heart rate/Pulse data reviewed   Date and Time: 04/03/2021 Thursday at 03:31 PM User: Charlann Lange Time logged: 04:00 Notes: REVIEW NOTES All the daily Blood pressure data noted since last review  Heart rate/Pulse data reviewed   Date and Time: 03/31/2021 Monday at 02:55 PM User: Charlann Lange Time logged: 08:00 Notes: REVIEW  NOTES All the daily Blood pressure data noted since last review  Heart rate/Pulse data reviewed   Date and Time: 03/25/2021 Tuesday at 03:37 PM User: Charlann Lange Time logged: 02:00 Notes: REVIEW NOTES All the daily Blood pressure data noted since last review  Heart rate/Pulse data reviewed  Verbal Consent Obtained on August 23, 2020. Patient was notified about Remote Patient Monitoring (RPM) program offered by our medical practice to improve the patient care, co-ordination of care, patient education, provide quality of care, reduce the cost and provide personalized care. Patient was informed that this is Medicare approved program and can be furnished by only one provider, can be stopped by the patient anytime. Patient has verbally consented to enroll in this program and our designated care coordinator to contact by phone as needed at least for 20 minutes a month. Practice also provides 24/7 care to the patient needs. All patient questions were answered and patient was enrolled in the RPM program.    Reviewed by: Alena Bills, PharmD Clinical Pharmacist [phone]

## 2021-04-23 ENCOUNTER — Telehealth: Payer: Medicare Other

## 2021-04-28 NOTE — Telephone Encounter (Signed)
Referral sent via [facility] to [facility]

## 2021-04-29 ENCOUNTER — Encounter: Payer: Self-pay | Admitting: Internal Medicine

## 2021-04-29 ENCOUNTER — Ambulatory Visit (INDEPENDENT_AMBULATORY_CARE_PROVIDER_SITE_OTHER): Payer: Medicare Other | Admitting: Internal Medicine

## 2021-04-29 ENCOUNTER — Other Ambulatory Visit: Payer: Self-pay

## 2021-04-29 VITALS — BP 126/70 | HR 105 | Temp 98.6°F | Resp 16 | Ht 63.0 in | Wt 167.8 lb

## 2021-04-29 DIAGNOSIS — J4520 Mild intermittent asthma, uncomplicated: Secondary | ICD-10-CM

## 2021-04-29 DIAGNOSIS — J45909 Unspecified asthma, uncomplicated: Secondary | ICD-10-CM | POA: Diagnosis not present

## 2021-04-29 DIAGNOSIS — J301 Allergic rhinitis due to pollen: Secondary | ICD-10-CM | POA: Diagnosis not present

## 2021-04-29 DIAGNOSIS — R0602 Shortness of breath: Secondary | ICD-10-CM | POA: Diagnosis not present

## 2021-04-29 MED ORDER — FLUTICASONE FUROATE-VILANTEROL 100-25 MCG/ACT IN AEPB: 1.0000 | INHALATION_SPRAY | Freq: Every day | RESPIRATORY_TRACT | 11 refills | Status: DC

## 2021-04-29 NOTE — Progress Notes (Signed)
[facility] [place], [place] [address]  Pulmonary Sleep Medicine   Office Visit Note  Patient Name: Summer Murphy DOB: 1955-12-12 MRN [id number]  Date of Service: 04/29/2021  Complaints/HPI: She states that she is doing OK. She has been off the allergy shots and she feels her breathing is doing a little better since she came off them. She notes her inhaler usage has gone down.  Denies having any chest pain no palpitations noted.  She has not had any swelling of her legs.  ROS  General: (-) fever, (-) chills, (-) night sweats, (-) weakness Skin: (-) rashes, (-) itching,. Eyes: (-) visual changes, (-) redness, (-) itching. Nose and Sinuses: (-) nasal stuffiness or itchiness, (-) postnasal drip, (-) nosebleeds, (-) sinus trouble. Mouth and Throat: (-) sore throat, (-) hoarseness. Neck: (-) swollen glands, (-) enlarged thyroid, (-) neck pain. Respiratory: - cough, (-) bloody sputum, - shortness of breath, - wheezing. Cardiovascular: - ankle swelling, (-) chest pain. Lymphatic: (-) lymph node enlargement. Neurologic: (-) numbness, (-) tingling. Psychiatric: (-) anxiety, (-) depression   Current Medication: Outpatient Encounter Medications as of 04/29/2021  Medication Sig   acetaminophen (TYLENOL) 500 MG tablet Take 2 tablets (1,000 mg total) by mouth every 6 (six) hours.   busPIRone (BUSPAR) 10 MG tablet Take 10 mg by mouth 2 (two) times daily.   clonazePAM (KLONOPIN) 0.5 MG tablet Take 0.5 mg by mouth at bedtime.   COMBIVENT RESPIMAT 20-100 MCG/ACT AERS respimat INHALE 1 PUFF INTO THE LUNGS EVERY 6 HOURS   diclofenac Sodium (VOLTAREN) 1 % GEL Apply 4 g topically 4 (four) times daily. (Patient taking differently: Apply 4 g topically 2 (two) times daily.)   diltiazem (DILACOR XR) 180 MG 24 hr capsule TAKE 1 CAPSULE(180 MG) BY MOUTH DAILY   diphenhydrAMINE (BENADRYL) 25 MG tablet Take 50 mg by mouth 2 (two) times daily as needed for allergies.    diphenoxylate-atropine (LOMOTIL) 2.5-0.025 MG tablet Take 1 tablet by mouth 3 (three) times daily as needed for diarrhea or loose stools.   DULoxetine (CYMBALTA) 60 MG capsule Take 60 mg by mouth at bedtime.   EPINEPHrine 0.3 mg/0.3 mL IJ SOAJ injection Inject 0.3 mg into the muscle as needed for anaphylaxis.   fluticasone (FLONASE) 50 MCG/ACT nasal spray Place 2 sprays into both nostrils daily.   fluticasone furoate-vilanterol (BREO ELLIPTA) 200-25 MCG/INH AEPB Inhale 1 puff into the lungs daily.   gabapentin (NEURONTIN) 600 MG tablet TAKE 1 TABLET BY MOUTH TWICE DAILY   ipratropium-albuterol (DUONEB) 0.5-2.5 (3) MG/3ML SOLN Take 3 mLs by nebulization every 4 (four) hours as needed (for shortness of breath/wheezing).   loratadine (CLARITIN) 10 MG tablet Take 1 tablet (10 mg total) by mouth daily.   metoprolol tartrate (LOPRESSOR) 25 MG tablet Take 1 tablet (25 mg total) by mouth 2 (two) times daily.   montelukast (SINGULAIR) 10 MG tablet Take 1 tablet (10 mg total) by mouth at bedtime.   ondansetron (ZOFRAN) 4 MG tablet Take one tab 2 x aday as needed   oxybutynin (DITROPAN) 5 MG tablet Take 1 tablet (5 mg total) by mouth in the morning and at bedtime.   pantoprazole (PROTONIX) 40 MG tablet Take 40 mg by mouth 2 (two) times daily.   rOPINIRole (REQUIP) 0.5 MG tablet Take two tablets po BID for restless legs.   sennosides-docusate sodium (SENOKOT-S) 8.6-50 MG tablet Take 2 tablets by mouth daily.   SUMAtriptan (IMITREX) 100 MG tablet Take 1 tablet (100 mg  total) by mouth every 2 (two) hours as needed (for migraine headaches.). May repeat in 1 hours if headache persists or recurs.   traZODone (DESYREL) 150 MG tablet Take 150 mg by mouth at bedtime.   valACYclovir (VALTREX) 500 MG tablet Take 1 tablet (500 mg total) by mouth daily as needed (for fever blisters).   No facility-administered encounter medications on file as of 04/29/2021.    Surgical History: Past Surgical History:  Procedure  Laterality Date   ABDOMINAL HYSTERECTOMY     AMPUTATION TOE Left 07/31/2016   Procedure: AMPUTATION TOE/MPJ 2nd toe;  Surgeon: Sharlotte Alamo, DPM;  Location: [facility] ORS;  Service: Podiatry;  Laterality: Left;   APPENDECTOMY  1990   BACK SURGERY     low back   BREAST SURGERY     bilateral breast reduction   CARDIAC ELECTROPHYSIOLOGY STUDY AND ABLATION     CHOLECYSTECTOMY  1990   COLONOSCOPY WITH PROPOFOL N/A 01/04/2017   Procedure: COLONOSCOPY WITH PROPOFOL;  Surgeon: Manya Silvas, MD;  Location: [facility] ENDOSCOPY;  Service: Endoscopy;  Laterality: N/A;   CORNEAL TRANSPLANT     ESOPHAGOGASTRODUODENOSCOPY N/A 04/09/2021   Procedure: ESOPHAGOGASTRODUODENOSCOPY (EGD);  Surgeon: Lin Landsman, MD;  Location: [facility] ENDOSCOPY;  Service: Gastroenterology;  Laterality: N/A;   ESOPHAGOGASTRODUODENOSCOPY (EGD) WITH PROPOFOL N/A 01/04/2017   Procedure: ESOPHAGOGASTRODUODENOSCOPY (EGD) WITH PROPOFOL;  Surgeon: Manya Silvas, MD;  Location: [facility] ENDOSCOPY;  Service: Endoscopy;  Laterality: N/A;   EXCISION BONE CYST Left 07/31/2016   Procedure: EXCISION BONE CYST/exostectomy 28124/left 2nd;  Surgeon: Sharlotte Alamo, DPM;  Location: [facility] ORS;  Service: Podiatry;  Laterality: Left;   EXTRACORPOREAL SHOCK WAVE LITHOTRIPSY Left 09/12/2015   Procedure: EXTRACORPOREAL SHOCK WAVE LITHOTRIPSY (ESWL);  Surgeon: Hollice Espy, MD;  Location: [facility] ORS;  Service: Urology;  Laterality: Left;   FRACTURE SURGERY     left foot   HARDWARE REMOVAL Left 11/14/2020   Procedure: HARDWARE REMOVAL;  Surgeon: Hessie Knows, MD;  Location: [facility] ORS;  Service: Orthopedics;  Laterality: Left;   [place] repair     JOINT REPLACEMENT Bilateral 2013,2014   total knees   LAPAROSCOPIC HYSTERECTOMY     LITHOTRIPSY     periprosthetic supracondylar fracture of left femur  02/16/2020   [facility]   TONSILLECTOMY     TOTAL KNEE REVISION Right 11/14/2019   Procedure: Revision patella and tibial polyethylene;  Surgeon: Hessie Knows, MD;   Location: [facility] ORS;  Service: Orthopedics;  Laterality: Right;   URETEROSCOPY      Medical History: Past Medical History:  Diagnosis Date   Acid reflux    Anemia    Anxiety    Arrhythmia    treated with meds and has no current problems   Arthritis    most uncomfortable in knees   Asthma    uses several inhalers   Depression    Fever blister    Hematuria    History of kidney stones    Hypertension    Hypoglycemia    Left flank pain    Migraine    Pre-diabetes    Restless leg    Yeast vaginitis     Family History: Family History  Problem Relation Age of Onset   Hypertension Mother    Stroke Mother    Prostate cancer Father    Kidney Stones Father    Diabetes Brother    Hypertension Brother    Breast cancer Maternal Aunt    Kidney disease Neg Hx     Social History: Social  History   Socioeconomic History   Marital status: Widowed    Spouse name: Not on file   Number of children: Not on file   Years of education: Not on file   Highest education level: Not on file  Occupational History   Not on file  Tobacco Use   Smoking status: Never   Smokeless tobacco: Never  Vaping Use   Vaping Use: Never used  Substance and Sexual Activity   Alcohol use: No   Drug use: No   Sexual activity: Not on file  Other Topics Concern   Not on file  Social History Narrative   Not on file   Social Determinants of Health   Financial Resource Strain: Not on file  Food Insecurity: Not on file  Transportation Needs: Not on file  Physical Activity: Not on file  Stress: Not on file  Social Connections: Not on file  Intimate Partner Violence: Not on file    Vital Signs: Blood pressure 126/70, pulse (!) 105, temperature 98.6 F (37 C), resp. rate 16, height 5\' 3"  (1.6 m), weight 167 lb 12.8 oz (76.1 kg), SpO2 96 %.  Examination: General Appearance: The patient is well-developed, well-nourished, and in no distress. Skin: Gross inspection of skin unremarkable. Head:  normocephalic, no gross deformities. Eyes: no gross deformities noted. ENT: ears appear grossly normal no exudates. Neck: Supple. No thyromegaly. No LAD. Respiratory: no rhonchi noted. Cardiovascular: Normal S1 and S2 without murmur or rub. Extremities: No cyanosis. pulses are equal. Neurologic: Alert and oriented. No involuntary movements.  LABS: Recent Results (from the past 2160 hour(s))  Resp Panel by RT-PCR (Flu A&B, Covid) Nasopharyngeal Swab     Status: None   Collection Time: 04/08/21  3:53 PM   Specimen: Nasopharyngeal Swab; Nasopharyngeal(NP) swabs in vial transport medium  Result Value Ref Range   SARS Coronavirus 2 by RT PCR NEGATIVE NEGATIVE    Comment: (NOTE) SARS-CoV-2 target nucleic acids are NOT DETECTED.  The SARS-CoV-2 RNA is generally detectable in upper respiratory specimens during the acute phase of infection. The lowest concentration of SARS-CoV-2 viral copies this assay can detect is 138 copies/mL. A negative result does not preclude SARS-Cov-2 infection and should not be used as the sole basis for treatment or other patient management decisions. A negative result may occur with  improper specimen collection/handling, submission of specimen other than nasopharyngeal swab, presence of viral mutation(s) within the areas targeted by this assay, and inadequate number of viral copies(<138 copies/mL). A negative result must be combined with clinical observations, patient history, and epidemiological information. The expected result is Negative.  Fact Sheet for Patients:  EntrepreneurPulse.com.au  Fact Sheet for Healthcare Providers:  IncredibleEmployment.be  This test is no t yet approved or cleared by the [place] FDA and  has been authorized for detection and/or diagnosis of SARS-CoV-2 by FDA under an Emergency Use Authorization (EUA). This EUA will remain  in effect (meaning this test can be used) for the duration  of the COVID-19 declaration under Section 564(b)(1) of the Act, 21 U.S.C.section 360bbb-3(b)(1), unless the authorization is terminated  or revoked sooner.       Influenza A by PCR NEGATIVE NEGATIVE   Influenza B by PCR NEGATIVE NEGATIVE    Comment: (NOTE) The Xpert Xpress SARS-CoV-2/FLU/RSV plus assay is intended as an aid in the diagnosis of influenza from Nasopharyngeal swab specimens and should not be used as a sole basis for treatment. Nasal washings and aspirates are unacceptable for Xpert Xpress SARS-CoV-2/FLU/RSV testing.  Fact Sheet for Patients: EntrepreneurPulse.com.au  Fact Sheet for Healthcare Providers: IncredibleEmployment.be  This test is not yet approved or cleared by the [place] FDA and has been authorized for detection and/or diagnosis of SARS-CoV-2 by FDA under an Emergency Use Authorization (EUA). This EUA will remain in effect (meaning this test can be used) for the duration of the COVID-19 declaration under Section 564(b)(1) of the Act, 21 U.S.C. section 360bbb-3(b)(1), unless the authorization is terminated or revoked.  Performed at [facility], [address] [place], [place] [address]     Radiology: No results found.  No results found.  No results found.    Assessment and Plan: Patient Active Problem List   Diagnosis Date Noted   Esophageal dysphagia    Benign esophageal stricture    S/P hardware removal 11/14/2020   IDA (iron deficiency anemia) 10/18/2020   Moderate persistent asthma with acute exacerbation 08/21/2020   S/P revision of total knee, right 11/14/2019   Seasonal allergic rhinitis due to pollen 09/20/2019   Callus of foot 08/13/2019   Candidal vaginitis 08/13/2019   Other symptoms and signs involving the nervous system 08/13/2019   Encounter for general adult medical examination with abnormal findings 08/13/2019   Restless leg syndrome 04/30/2019   Type 2 diabetes  mellitus with hyperglycemia ([place]) 04/02/2019   Muscle cramps 76/81/1572   Diastolic dysfunction 62/05/5595   SOB (shortness of breath) 12/14/2018   Hiatal hernia with GERD 12/14/2018   Wheezing 12/14/2018   Impaired fasting glucose 12/14/2018   COPD with acute exacerbation ([place]) 09/28/2018   Acute upper respiratory infection 08/25/2018   Recurrent sinusitis 08/15/2018   Prediabetes 08/15/2018   Nonintractable headache 08/15/2018   Dysuria 08/15/2018   Intractable migraine without status migrainosus 04/22/2018   Nausea 04/22/2018   Polyneuropathy associated with underlying disease ([place]) 04/22/2018   Midline low back pain without sciatica 04/22/2018   Routine cervical smear 04/22/2018   Essential hypertension 07/01/2017   Acute bronchitis with asthma 07/01/2017   Allergic rhinitis 07/01/2017   Moderate asthma without complication 41/63/8453   Severe recurrent major depression without psychotic features ([place]) 09/10/2014   COPD (chronic obstructive pulmonary disease) ([place]) 09/10/2014   Calculi, ureter 04/26/2013   Corneal graft malfunction 08/17/2012   Calculus of kidney 64/68/0321   Renal colic 22/48/2500   Urge incontinence 04/26/2012   Diaphragmatic hernia 10/27/2010   Barrett esophagus 07/07/2010    1. Shortness of breath Clinically appears to be improving with combination of medical therapy as well as allergy injections. - Pulmonary function test; Future  2. Seasonal allergic rhinitis due to pollen Antihistamines continue with allergy injections  3. Chronic asthma, mild intermittent, uncomplicated She is supposed to be on breo combivent but has not been using new script given   General Counseling: I have discussed the findings of the evaluation and examination with Summer Murphy.  I have also discussed any further diagnostic evaluation thatmay be needed or ordered today. Summer Murphy verbalizes understanding of the findings of todays visit. We also reviewed her medications today and  discussed drug interactions and side effects including but not limited excessive drowsiness and altered mental states. We also discussed that there is always a risk not just to her but also people around her. she has been encouraged to call the office with any questions or concerns that should arise related to todays visit.  Orders Placed This Encounter  Procedures   Pulmonary function test    Standing Status:   Future    Standing Expiration  Date:   04/29/2022    Order Specific Question:   Where should this test be performed?    Answer:   [facility]     Time spent: 23  I have personally obtained a history, examined the patient, evaluated laboratory and imaging results, formulated the assessment and plan and placed orders.    Allyne Gee, MD [facility] Pulmonary and Critical Care Sleep medicine

## 2021-04-29 NOTE — Patient Instructions (Signed)

## 2021-04-30 NOTE — Telephone Encounter (Signed)
Unable to be seen by Dr. Bary Castilla. He does not treat hiatal hernia. Redirected referral to [facility] via [place]

## 2021-05-14 ENCOUNTER — Other Ambulatory Visit: Payer: Self-pay

## 2021-05-14 ENCOUNTER — Ambulatory Visit: Payer: Medicare Other | Admitting: Student-PharmD

## 2021-05-14 DIAGNOSIS — M978XXA Periprosthetic fracture around other internal prosthetic joint, initial encounter: Secondary | ICD-10-CM | POA: Diagnosis not present

## 2021-05-14 DIAGNOSIS — G2581 Restless legs syndrome: Secondary | ICD-10-CM

## 2021-05-14 DIAGNOSIS — Z96659 Presence of unspecified artificial knee joint: Secondary | ICD-10-CM | POA: Diagnosis not present

## 2021-05-14 DIAGNOSIS — G63 Polyneuropathy in diseases classified elsewhere: Secondary | ICD-10-CM | POA: Diagnosis not present

## 2021-05-14 DIAGNOSIS — I10 Essential (primary) hypertension: Secondary | ICD-10-CM

## 2021-05-14 DIAGNOSIS — E1165 Type 2 diabetes mellitus with hyperglycemia: Secondary | ICD-10-CM

## 2021-05-14 DIAGNOSIS — E782 Mixed hyperlipidemia: Secondary | ICD-10-CM

## 2021-05-14 DIAGNOSIS — T8484XA Pain due to internal orthopedic prosthetic devices, implants and grafts, initial encounter: Secondary | ICD-10-CM | POA: Diagnosis not present

## 2021-05-14 DIAGNOSIS — J301 Allergic rhinitis due to pollen: Secondary | ICD-10-CM

## 2021-05-14 DIAGNOSIS — J441 Chronic obstructive pulmonary disease with (acute) exacerbation: Secondary | ICD-10-CM

## 2021-05-14 MED ORDER — ROPINIROLE HCL 0.5 MG PO TABS: ORAL_TABLET | ORAL | 1 refills | Status: DC

## 2021-05-14 NOTE — Progress Notes (Signed)
Follow Up Pharmacist Visit  Summer Murphy, Summer Murphy 68 years, Female  DOB: 05-27-1955  M: [phone]  Patient Chart Prep  Completed by Charlann Lange on 04/23/2021  Chronic Conditions Patient's Chronic Conditions: Hypertension (HTN), Chronic Obstructive Pulmonary Disease (COPD), Gastroesophageal Reflux Disease (GERD), Diabetes (DM)  Doctor and Hospital Visits Were there PCP Visits since last visit with the Pharmacist?: Yes Visit #1: 04/01/21 Allyne Gee, MD. For shortness of breath. No medication changes. Visit #2: 04/15/21 Allyne Gee, MD. For Nausea and vomiting. CHANGED Ondansetron to 4 mg 1 tablet ex a day as needed. Visit #3: 04/29/21 Allyne Gee, MD. For Shortness of breath. STOPPED 200-25 MCG/INH 1 puff inhalation daily. STARTED Fluticasone Furoate-Vilanterol 100-25 MCG/ACT 1 puff inhalation daily. Were there Specialist Visits since last visit with the Pharmacist?: No Was there a Hospital Visit in last 30 days?: No Were there other Hospital Visits since last visit with the Pharmacist?: Yes Visit #1: 04/08/21 [facility] EMERGENCY DEPARTMENT Rada Hay, MD For Foreign body in esophagus. No medication changes. Visit #2: 04/09/21 [facility] Marius Ditch, Tally Due, MD. For  endoscopy. No medication changes.  Medication Information Have there been any medication changes from PCP or Specialist since last visit with the Pharmacist?: No Are there any Medication adherence gaps (beyond 5 days past due)?: No Medication adherence rates for the STAR rating drugs: None. List Patient's current Care Gaps: No current Care Gaps identified  Pre-Call Questions  Completed by Charlann Lange on 05/09/2021  Are you able to connect with Patient: Yes Confirmed appointment date/time with patient/caregiver?: Yes Date/time of the appointment: 05/09/21 at 4: 00 PM Visit type: Phone Patient/Caregiver instructed to bring medications to  appointment: Yes What, if any, problems do you have getting your medications from the pharmacy?: None What is your top health concern to discuss at your upcoming visit?: Patient stated she has been having stomach but she is going to see a Psychologist, sport and exercise for this soon. Have you seen any other providers since your last visit?: No  Disease Assessments  Subjective Information Current BP: 126/70 Current HR: 105 taken on: 04/29/2021 Weight: 167 BMI: 29.72 Last GFR: 97 taken on: 09/15/2020 Why did the patient present?: CCM F/U Visit Any additional demeanor/mood notes?: Patient presents via telephone and has medications present for appt  Chronic Obstructive Pulmonary Disease (COPD) Current FEV1/FVC: 69% Current FEV1: 1.1% taken on: 04/01/2021 Current Eosinophils: 0.3 taken on: 01/16/2022 Assess this condition today?: Yes Gold group: A (low sx, < 2 exacerbations / yr) Exacerbations since last visit with pharmacist?: No Has there been change in patients smoking/vaping habit since last visit with pharmacist?: No Home oxygen therapy: No Frequency of SABA/SAMA use: 1-2 times per week We discussed: Inhaler technique Assessment:: Controlled Drug: Breo 200-25 1 puff daily Assessment: Appropriate, Effective, Safe, Accessible Drug: Combivent 20-100 1 puff every 6 hours Assessment: Appropriate, Effective, Safe, Accessible Drug: Duoneb 1 every 4 hours as needed Assessment: Appropriate, Effective, Safe, Accessible Plan to (other): Continue medication therapy HC Follow up: DM Call in april, BP call in june, general call in august Pharmacist Follow up: 12/10/21  GERD Assessment Assess this condition today?: Yes How frequently do you have symptoms of GERD or reflux?: weekly When does patient experience reflux symptoms?: After meals, At bedtime / while sleeping Assessment:: Controlled Drug: Pantoprazole 40mg  1 tab twice daily Assessment: Appropriate, Effective, Safe, Accessible Additional Info:  Patient has stomach pain issues currently but is being seen on 3/1 to evaluate surgery need  Plan to: Continue medication therapy Pharmacist Follow up: 12/10/21  Exercise, Diet and Non-Drug Coordination Needs Additional exercise counseling points. We discussed: targeting at least 151 minutes per week of moderate-intensity aerobic exercise. Discussed Non-Drug Care Coordination Needs: Yes Does Patient have Medication financial barriers?: No  Accountable Health Communities Health-Related Social Needs Screening Tool -  SDOH  What is your living situation today? (ref #1): I have a steady place to live Think about the place you live. Do you have problems with any of the following? (ref #2): None of the above Within the past 12 months, you worried that your food would run out before you got money to buy more (ref #3): Never true Within the past 12 months, the food you bought just didn't last and you didn't have money to get more (ref #4): Never true In the past 12 months, has lack of reliable transportation kept you from medical appointments, meetings, work or from getting things needed for daily living? (ref #5): No In the past 12 months, has the electric, gas, oil, or water company threatened to shut off services in your home? (ref #6): No How often does anyone, including family and friends, physically hurt you? (ref #7): Never (1) How often does anyone, including family and friends, insult or talk down to you? (ref #8): Never (1) How often does anyone, including friends and family, threaten you with harm? (ref #9): Never (1) How often does anyone, including family and friends, scream or curse at you? (ref #10): Never (1)  COPD and Physical Activity Chronic obstructive pulmonary disease (COPD) is a long-term, or chronic, condition that affects the lungs. COPD is a general term that can be used to describe many problems that cause inflammation of the lungs and limit airflow. These conditions include  chronic bronchitis and emphysema. The main symptom of COPD is shortness of breath, which makes it harder to do even simple tasks. This can also make it harder to exercise and stay active. Talk with your health care provider about treatments to help you breathe better and actions you can take to prevent breathing problems during physical activity. What are the benefits of exercising when you have COPD? Exercising regularly is an important part of a healthy lifestyle. You can still exercise and do physical activities even though you have COPD. Exercise and physical activity improve your shortness of breath by increasing blood flow (circulation). This causes your heart to pump more oxygen through your body. Moderate exercise can: Improve oxygen use. Increase your energy level. Help with shortness of breath. Strengthen your breathing muscles. Improve heart health. Help with sleep. Improve your self-esteem and feelings of self-worth. Lower depression, stress, and anxiety. Exercise can benefit everyone with COPD. The severity of your disease may affect how hard you can exercise, especially at first, but everyone can benefit. Talk with your health care provider about how much exercise is safe for you, and which activities and exercises are safe for you. What actions can I take to prevent breathing problems during physical activity? Sign up for a pulmonary rehabilitation program. This type of program may include: Education about lung diseases. Exercise classes that teach you how to exercise and be more active while improving your breathing. This usually involves: Exercise using your lower extremities, such as a stationary bicycle. About 30 minutes of exercise, 2 to 5 times per week, for 6 to 12 weeks. Strength training, such as push-ups or leg lifts. Nutrition education. Group classes in which you can talk with  others who also have COPD and learn ways to manage stress. If you use an oxygen tank, you  should use it while you exercise. Work with your health care provider to adjust your oxygen for your physical activity. Your resting flow rate is different from your flow rate during physical activity. How to manage your breathing while exercising While you are exercising: Take slow breaths. Pace yourself, and do nottry to go too fast. Purse your lips while breathing out. Pursing your lips is similar to a kissing or whistling position. If doing exercise that uses a quick burst of effort, such as weight lifting: Breathe in before starting the exercise. Breathe out during the hardest part of the exercise, such as raising the weights. Where to find support You can find support for exercising with COPD from: Your health care provider. A pulmonary rehabilitation program. Your local health department or community health programs. Support groups, either online or in-person. Your health care provider may be able to recommend support groups. Where to find more information You can find more information about exercising with COPD from: American Lung Association: lung.org COPD Foundation: copdfoundation.org Contact a health care provider if: Your symptoms get worse. You have nausea. You have a fever. You want to start a new exercise program or a new activity. Get help right away if: You have chest pain. You cannot breathe. These symptoms may represent a serious problem that is an emergency. Do not wait to see if the symptoms will go away. Get medical help right away. Call your local emergency services (911 in the [place] Do not drive yourself to the hospital. Summary COPD is a general term that can be used to describe many different lung problems that cause lung inflammation and limit airflow. This includes chronic bronchitis and emphysema. Exercise and physical activity improve your shortness of breath by increasing blood flow (circulation). This causes your heart to provide more oxygen to your  body. Contact your health care provider before starting any exercise program or new activity. Ask your health care provider what exercises and activities are safe for you. This information is not intended to replace advice given to you by your health care provider. Make sure you discuss any questions you have with your health care provider.       Engagement Notes HC Chart Review: 21 min 04/23/20 HC Assessment call time spent: 9 min 05/09/21  CP Chart Prep: 10 min 04/23/21 CP Office Visit: 18 min 05/14/21 CP Office Visit Documentation: 25 min 05/14/21  Alena Bills Clinical Pharmacist [phone]

## 2021-05-20 ENCOUNTER — Telehealth: Payer: Self-pay | Admitting: Student-PharmD

## 2021-05-20 DIAGNOSIS — J449 Chronic obstructive pulmonary disease, unspecified: Secondary | ICD-10-CM

## 2021-05-20 DIAGNOSIS — I10 Essential (primary) hypertension: Secondary | ICD-10-CM

## 2021-05-20 NOTE — Progress Notes (Signed)
° °  Chronic Care Management Pharmacy Assistant   Name: Summer Murphy  MRN: [id number] DOB: 01-31-1956  Reason for Encounter: Care Plan and Handout Plan    Medications: Outpatient Encounter Medications as of 05/20/2021  Medication Sig   acetaminophen (TYLENOL) 500 MG tablet Take 2 tablets (1,000 mg total) by mouth every 6 (six) hours.   busPIRone (BUSPAR) 10 MG tablet Take 10 mg by mouth 2 (two) times daily.   clonazePAM (KLONOPIN) 0.5 MG tablet Take 0.5 mg by mouth at bedtime.   COMBIVENT RESPIMAT 20-100 MCG/ACT AERS respimat INHALE 1 PUFF INTO THE LUNGS EVERY 6 HOURS   diclofenac Sodium (VOLTAREN) 1 % GEL Apply 4 g topically 4 (four) times daily. (Patient taking differently: Apply 4 g topically 2 (two) times daily.)   diltiazem (DILACOR XR) 180 MG 24 hr capsule TAKE 1 CAPSULE(180 MG) BY MOUTH DAILY   diphenhydrAMINE (BENADRYL) 25 MG tablet Take 50 mg by mouth 2 (two) times daily as needed for allergies.   diphenoxylate-atropine (LOMOTIL) 2.5-0.025 MG tablet Take 1 tablet by mouth 3 (three) times daily as needed for diarrhea or loose stools.   DULoxetine (CYMBALTA) 60 MG capsule Take 60 mg by mouth at bedtime.   EPINEPHrine 0.3 mg/0.3 mL IJ SOAJ injection Inject 0.3 mg into the muscle as needed for anaphylaxis.   fluticasone (FLONASE) 50 MCG/ACT nasal spray Place 2 sprays into both nostrils daily.   fluticasone furoate-vilanterol (BREO ELLIPTA) 100-25 MCG/ACT AEPB Inhale 1 puff into the lungs daily.   gabapentin (NEURONTIN) 600 MG tablet TAKE 1 TABLET BY MOUTH TWICE DAILY   ipratropium-albuterol (DUONEB) 0.5-2.5 (3) MG/3ML SOLN Take 3 mLs by nebulization every 4 (four) hours as needed (for shortness of breath/wheezing).   loratadine (CLARITIN) 10 MG tablet Take 1 tablet (10 mg total) by mouth daily.   metoprolol tartrate (LOPRESSOR) 25 MG tablet Take 1 tablet (25 mg total) by mouth 2 (two) times daily.   montelukast (SINGULAIR) 10 MG tablet Take 1 tablet (10 mg total) by mouth at bedtime.    ondansetron (ZOFRAN) 4 MG tablet Take one tab 2 x aday as needed   oxybutynin (DITROPAN) 5 MG tablet Take 1 tablet (5 mg total) by mouth in the morning and at bedtime.   pantoprazole (PROTONIX) 40 MG tablet Take 40 mg by mouth 2 (two) times daily.   rOPINIRole (REQUIP) 0.5 MG tablet Take two tablets po BID for restless legs.   sennosides-docusate sodium (SENOKOT-S) 8.6-50 MG tablet Take 2 tablets by mouth daily.   SUMAtriptan (IMITREX) 100 MG tablet Take 1 tablet (100 mg total) by mouth every 2 (two) hours as needed (for migraine headaches.). May repeat in 1 hours if headache persists or recurs.   traZODone (DESYREL) 150 MG tablet Take 150 mg by mouth at bedtime.   valACYclovir (VALTREX) 500 MG tablet Take 1 tablet (500 mg total) by mouth daily as needed (for fever blisters).   No facility-administered encounter medications on file as of 05/20/2021.    Reviewed the patients visit reinsured it was completed per the pharmacist Alena Bills request. Printed the CCM care plan. Mailed the patient CCM care plan and patient handout to their most recent address on file.  Time: 3 min  Charlann Lange, Eva  [phone]

## 2021-05-21 ENCOUNTER — Telehealth: Payer: Self-pay

## 2021-05-21 ENCOUNTER — Other Ambulatory Visit: Payer: Self-pay

## 2021-05-21 ENCOUNTER — Encounter: Payer: Self-pay | Admitting: Surgery

## 2021-05-21 ENCOUNTER — Other Ambulatory Visit: Payer: Self-pay | Admitting: Internal Medicine

## 2021-05-21 ENCOUNTER — Ambulatory Visit (INDEPENDENT_AMBULATORY_CARE_PROVIDER_SITE_OTHER): Payer: Medicare Other | Admitting: Surgery

## 2021-05-21 VITALS — BP 155/78 | HR 101 | Temp 99.3°F | Ht 63.0 in | Wt 161.6 lb

## 2021-05-21 DIAGNOSIS — K449 Diaphragmatic hernia without obstruction or gangrene: Secondary | ICD-10-CM | POA: Diagnosis not present

## 2021-05-21 DIAGNOSIS — J4541 Moderate persistent asthma with (acute) exacerbation: Secondary | ICD-10-CM

## 2021-05-21 NOTE — Telephone Encounter (Signed)
CT scheduled 06/03/2021 @ 1 pm [facility]. Nothing to eat/drink 4 hours prior. Please pick up prep kit 2 days prior.  ? ?Barium swallow 06/11/21 @ 10 am [facility]. Nothing to eat/drink 4 hours prior.  ? ?Gastric emptying scheduled @ [place] on 3/31/20223 at 8 am. ?Patient notified of all the above appointments. ? ? ?

## 2021-05-21 NOTE — Patient Instructions (Addendum)
I will call you later today to let you know when your test will be scheduled.  ? ? ? ? ? ?Hiatal Hernia ?A hiatal hernia occurs when part of the stomach slides above the muscle that separates the abdomen from the chest (diaphragm). A person can be born with a hiatal hernia (congenital), or it may develop over time. In almost all cases of hiatal hernia, only the top part of the stomach pushes through the diaphragm. ?Many people have a hiatal hernia with no symptoms. The larger the hernia, the more likely it is that you will have symptoms. In some cases, a hiatal hernia allows stomach acid to flow back into the tube that carries food from your mouth to your stomach (esophagus). This may cause heartburn symptoms. Severe heartburn symptoms may mean that you have developed a condition called gastroesophageal reflux disease (GERD). ?What are the causes? ?This condition is caused by a weakness in the opening (hiatus) where the esophagus passes through the diaphragm to attach to the upper part of the stomach. A person may be born with a weakness in the hiatus, or a weakness can develop over time. ?What increases the risk? ?This condition is more likely to develop in: ?Older people. Age is a major risk factor for a hiatal hernia, especially if you are over the age of 46. ?Pregnant women. ?People who are overweight. ?People who have frequent constipation. ?What are the signs or symptoms? ?Symptoms of this condition usually develop in the form of GERD symptoms. Symptoms include: ?Heartburn. ?Belching. ?Indigestion. ?Trouble swallowing. ?Coughing or wheezing. ?Sore throat. ?Hoarseness. ?Chest pain. ?Nausea and vomiting. ?How is this diagnosed? ?This condition may be diagnosed during testing for GERD. Tests that may be done include: ?X-rays of your stomach or chest. ?An upper gastrointestinal (GI) series. This is an X-ray exam of your GI tract that is taken after you swallow a chalky liquid that shows up clearly on the  X-ray. ?Endoscopy. This is a procedure to look into your stomach using a thin, flexible tube that has a tiny camera and light on the end of it. ?How is this treated? ?This condition may be treated by: ?Dietary and lifestyle changes to help reduce GERD symptoms. ?Medicines. These may include: ?Over-the-counter antacids. ?Medicines that make your stomach empty more quickly. ?Medicines that block the production of stomach acid (H2 blockers). ?Stronger medicines to reduce stomach acid (proton pump inhibitors). ?Surgery to repair the hernia, if other treatments are not helping. ?If you have no symptoms, you may not need treatment. ?Follow these instructions at home: ?Lifestyle and activity ?Do not use any products that contain nicotine or tobacco, such as cigarettes and e-cigarettes. If you need help quitting, ask your health care provider. ?Try to achieve and maintain a healthy body weight. ?Avoid putting pressure on your abdomen. Anything that puts pressure on your abdomen increases the amount of acid that may be pushed up into your esophagus. ?Avoid bending over, especially after eating. ?Raise the head of your bed by putting blocks under the legs. This keeps your head and esophagus higher than your stomach. ?Do not wear tight clothing around your chest or stomach. ?Try not to strain when having a bowel movement, when urinating, or when lifting heavy objects. ?Eating and drinking ?Avoid foods that can worsen GERD symptoms. These may include: ?Fatty foods, like fried foods. ?Citrus fruits, like oranges or lemon. ?Other foods and drinks that contain acid, like orange juice or tomatoes. ?Spicy food. ?Chocolate. ?Eat frequent small meals instead of  three large meals a day. This helps prevent your stomach from getting too full. ?Eat slowly. ?Do not lie down right after eating. ?Do not eat 1-2 hours before bed. ?Do not drink beverages with caffeine. These include cola, coffee, cocoa, and tea. ?Do not drink alcohol. ?General  instructions ?Take over-the-counter and prescription medicines only as told by your health care provider. ?Keep all follow-up visits as told by your health care provider. This is important. ?Contact a health care provider if: ?Your symptoms are not controlled with medicines or lifestyle changes. ?You are having trouble swallowing. ?You have coughing or wheezing that will not go away. ?Get help right away if: ?Your pain is getting worse. ?Your pain spreads to your arms, neck, jaw, teeth, or back. ?You have shortness of breath. ?You sweat for no reason. ?You feel sick to your stomach (nauseous) or you vomit. ?You vomit blood. ?You have bright red blood in your stools. ?You have black, tarry stools. ?Summary ?A hiatal hernia occurs when part of the stomach slides above the muscle that separates the abdomen from the chest (diaphragm). ?A person may be born with a weakness in the hiatus, or a weakness can develop over time. ?Symptoms of hiatal hernia may include heartburn, trouble swallowing, or sore throat. ?Management of hiatal hernia includes eating frequent small meals instead of three large meals a day. ?Get help right away if you vomit blood, have bright red blood in your stools, or have black, tarry stools. ?This information is not intended to replace advice given to you by your health care provider. Make sure you discuss any questions you have with your health care provider. ?Document Revised: 02/08/2020 Document Reviewed: 02/08/2020 ?Elsevier Patient Education ? [place]. ? ?

## 2021-05-22 ENCOUNTER — Encounter: Payer: Self-pay | Admitting: Surgery

## 2021-05-22 NOTE — Progress Notes (Signed)
Patient ID: Summer Murphy, female   DOB: 1955/11/18, 66 y.o.   MRN: [id number]  HPI Summer Murphy is a 66 y.o. female seen in consultation at the request of Dr. Marius Ditch, [place] significant for asthma, COPD HTN, DM,depression, back pain, Barrett's esophagus s/p ablation, bulemia and GERD s/p nissen fundoplication x 2 who presents to clinic with heartburn. Seen at both [facility] and [facility] before, Prior history of Barrett's esophagus and low-grade dysplasia with history of ablation.   She does have also history of gastroparesis  She is states that she had a Nissen fundoplication decades ago , 30 years or so I believe it was open.  He is unable to recall the details.  I have looked at the notes from Dr. Precious Bard 2017 and apparently she did had Nissen fundoplication's in an open fashion several decades ago.  1 point time she also had erosions at the GE junction associated with a hiatal hernia. Notes from 6 years ago from Dr. Precious Bard stated that they were evaluating for a potential subtotal gastrectomy. sHe endorses dyspnea on exertion and chronic cough as well as retrosternal pain and reflux. Most recently 6 weeks ago she underwent EGD with esophageal dilation by Dr. Marius Ditch.  She did have an stricture.  A large hiatal hernia was seen with an esophageal erosion.  Is not that I personally review endoscopic images.  He also had a gastric emptying study 2 years ago showing evidence of gastroparesis.  Did have a CT scan few years ago that have personally reviewed showing evidence of paraesophageal hernia with postoperative changes in the mediastinum. CBC was normal as well as a normal creatinine.  He is diabetic Surgical abdominal history is extensive consistent with a hiatal hernia x2 in an open fashion, hysterectomy, cholecystectomy, appendectomy  He has also had right knee  joint replacement well as hip fracture. He does have some decreased mobility   HPI  Past Medical History:  Diagnosis Date   Acid reflux     Anemia    Anxiety    Arrhythmia    treated with meds and has no current problems   Arthritis    most uncomfortable in knees   Asthma    uses several inhalers   Depression    Fever blister    Hematuria    History of kidney stones    Hypertension    Hypoglycemia    Left flank pain    Migraine    Pre-diabetes    Restless leg    Yeast vaginitis     Past Surgical History:  Procedure Laterality Date   ABDOMINAL HYSTERECTOMY     AMPUTATION TOE Left 07/31/2016   Procedure: AMPUTATION TOE/MPJ 2nd toe;  Surgeon: Sharlotte Alamo, DPM;  Location: [facility] ORS;  Service: Podiatry;  Laterality: Left;   APPENDECTOMY  1990   BACK SURGERY     low back   BREAST SURGERY     bilateral breast reduction   CARDIAC ELECTROPHYSIOLOGY STUDY AND ABLATION     CHOLECYSTECTOMY  1990   COLONOSCOPY WITH PROPOFOL N/A 01/04/2017   Procedure: COLONOSCOPY WITH PROPOFOL;  Surgeon: Manya Silvas, MD;  Location[facility] END[facility]COPY;  Service: Endoscopy;  Laterality: N/A;   CORNEAL TRANSPLANT     ESOPHAGOGASTRODUODENOSCOPY N/A 04/09/2021   Procedure: ESOPHAGOGASTRODUODENOSCOPY (EGD);  Surgeon: Lin Landsman, MD;  Location: [facility] ENDOSCOPY;  Service: Gastroenterology;  Laterality: N/A;   ESOPHAGOGASTRODUODENOSCOPY (EGD) WITH PROPOFOL N/A 01/04/2017   Procedure: ESOPHAGOGASTRODUODENOSCOPY (EGD) WITH PROPOFOL;  Surgeon: Manya Silvas, MD;  Location: [facility] ENDOSCOPY;  Service: Endoscopy;  Laterality: N/A;   EXCISION BONE CYST Left 07/31/2016   Procedure: EXCISION BONE CYST/exostectomy 28124/left 2nd;  Surgeon: Sharlotte Alamo, DPM;  Location: [facility] ORS;  Service: Podiatry;  Laterality: Left;   EXTRACORPOREAL SHOCK WAVE LITHOTRIPSY Left 09/12/2015   Procedure: EXTRACORPOREAL SHOCK WAVE LITHOTRIPSY (ESWL);  Surgeon: Hollice Espy, MD;  Location: [facility] ORS;  Service: Urology;  Laterality: Left;   FRACTURE SURGERY     left foot   HARDWARE REMOVAL Left 11/14/2020   Procedure: HARDWARE REMOVAL;  Surgeon: Hessie Knows, MD;   Location: [facility] ORS;  Service: Orthopedics;  Laterality: Left;   [employer] repair     Fundoplication   JOINT REPLACEMENT Bilateral 2013,2014   total knees   LAPAROSCOPIC HYSTERECTOMY     LITHOTRIPSY     periprosthetic supracondylar fracture of left femur  02/16/2020   [facility]   TONSILLECTOMY     TOTAL KNEE REVISION Right 11/14/2019   Procedure: Revision patella and tibial polyethylene;  Surgeon: Hessie Knows, MD;  Location: [facility] ORS;  Service: Orthopedics;  Laterality: Right;   URETEROSCOPY      Family History  Problem Relation Age of Onset   Hypertension Mother    Stroke Mother    Prostate cancer Father    Kidney Stones Father    Diabetes Brother    Hypertension Brother    Breast cancer Maternal Aunt    Kidney disease Neg Hx     Social History Social History   Tobacco Use   Smoking status: Never   Smokeless tobacco: Never  Vaping Use   Vaping Use: Never used  Substance Use Topics   Alcohol use: No   Drug use: No    Allergies  Allergen Reactions   Librium [Chlordiazepoxide] Shortness Of Breath   Metoclopramide Hives and Other (See Comments)    hallucinations    Vanilla Shortness Of Breath   Aspirin Hives   Nsaids Rash    Rash/flares asthma issues.   Azithromycin Other (See Comments)    Unsure of what the reaction was.   Buprenorphine Hcl Other (See Comments)    Unsure of what the reaction was.    Chlordiazepoxide Hcl Other (See Comments)    unsure   Morphine Other (See Comments)    Unsure of reaction   Sulfa Antibiotics     Other reaction(s): Unknown   Tolmetin     Other Reaction: Allergy   Amlodipine Besylate Itching and Rash    arms, stomach and forehead   Iron Nausea And Vomiting    Other reaction(s): Unknown    Current Outpatient Medications  Medication Sig Dispense Refill   acetaminophen (TYLENOL) 500 MG tablet Take 2 tablets (1,000 mg total) by mouth every 6 (six) hours. 30 tablet 0   busPIRone (BUSPAR) 10 MG tablet Take 10 mg by mouth 2  (two) times daily.     clonazePAM (KLONOPIN) 0.5 MG tablet Take 0.5 mg by mouth at bedtime.     diclofenac Sodium (VOLTAREN) 1 % GEL Apply 4 g topically 4 (four) times daily. (Patient taking differently: Apply 4 g topically 2 (two) times daily.) 350 g 1   diltiazem (DILACOR XR) 180 MG 24 hr capsule TAKE 1 CAPSULE(180 MG) BY MOUTH DAILY 90 capsule 1   diphenhydrAMINE (BENADRYL) 25 MG tablet Take 50 mg by mouth 2 (two) times daily as needed for allergies.     diphenoxylate-atropine (LOMOTIL) 2.5-0.025 MG tablet Take 1 tablet by mouth 3 (three) times daily as needed for  diarrhea or loose stools. 30 tablet 0   DULoxetine (CYMBALTA) 60 MG capsule Take 60 mg by mouth at bedtime.     EPINEPHrine 0.3 mg/0.3 mL IJ SOAJ injection Inject 0.3 mg into the muscle as needed for anaphylaxis. 1 each 3   fluticasone (FLONASE) 50 MCG/ACT nasal spray Place 2 sprays into both nostrils daily. 16 g 6   fluticasone furoate-vilanterol (BREO ELLIPTA) 100-25 MCG/ACT AEPB Inhale 1 puff into the lungs daily. 1 each 11   gabapentin (NEURONTIN) 600 MG tablet TAKE 1 TABLET BY MOUTH TWICE DAILY 60 tablet 3   ipratropium-albuterol (DUONEB) 0.5-2.5 (3) MG/3ML SOLN Take 3 mLs by nebulization every 4 (four) hours as needed (for shortness of breath/wheezing). 360 mL 6   loratadine (CLARITIN) 10 MG tablet Take 1 tablet (10 mg total) by mouth daily. 30 tablet 11   metoprolol tartrate (LOPRESSOR) 25 MG tablet Take 1 tablet (25 mg total) by mouth 2 (two) times daily. 180 tablet 1   montelukast (SINGULAIR) 10 MG tablet Take 1 tablet (10 mg total) by mouth at bedtime. 90 tablet 1   ondansetron (ZOFRAN) 4 MG tablet Take one tab 2 x aday as needed 45 tablet 1   oxybutynin (DITROPAN) 5 MG tablet Take 1 tablet (5 mg total) by mouth in the morning and at bedtime. 180 tablet 1   pantoprazole (PROTONIX) 40 MG tablet Take 40 mg by mouth 2 (two) times daily.     rOPINIRole (REQUIP) 0.5 MG tablet Take two tablets po BID for restless legs. 360 tablet 1    sennosides-docusate sodium (SENOKOT-S) 8.6-50 MG tablet Take 2 tablets by mouth daily. 30 tablet 1   SUMAtriptan (IMITREX) 100 MG tablet Take 1 tablet (100 mg total) by mouth every 2 (two) hours as needed (for migraine headaches.). May repeat in 1 hours if headache persists or recurs. 10 tablet 3   traZODone (DESYREL) 150 MG tablet Take 150 mg by mouth at bedtime.     valACYclovir (VALTREX) 500 MG tablet Take 1 tablet (500 mg total) by mouth daily as needed (for fever blisters). 90 tablet 1   Ipratropium-Albuterol (COMBIVENT RESPIMAT) 20-100 MCG/ACT AERS respimat INHALE 1 PUFF BY MOUTH INTO THE LUNGS EVERY 6 HOURS 4 g 3   No current facility-administered medications for this visit.     Review of Systems Full ROS  was asked and was negative except for the information on the HPI  Physical Exam Blood pressure (!) 155/78, pulse (!) 101, temperature 99.3 F (37.4 C), temperature source Oral, height 5\' 3"  (1.6 m), weight 161 lb 9.6 oz (73.3 kg), SpO2 98 %. CONSTITUTIONAL: NAD. EYES: Pupils are equal, round,  Sclera are non-icteric. EARS, NOSE, MOUTH AND THROAT: sHe is wearing a mask hearing is intact to voice. LYMPH NODES:  Lymph nodes in the neck are normal. RESPIRATORY:  Lungs are clear. There is normal respiratory effort, with equal breath sounds bilaterally, and without pathologic use of accessory muscles. CARDIOVASCULAR: Heart is regular without murmurs, gallops, or rubs. GI: The abdomen is  soft, nontender, and nondistended. He has several scars including a Mercedes scar.  Dictated a major foregut procedure there are no palpable masses. There is no hepatosplenomegaly. There are normal bowel sounds i GU: Rectal deferred.   MUSCULOSKELETAL: Normal muscle strength and tone. No cyanosis or edema.   SKIN: Turgor is good and there are no pathologic skin lesions or ulcers. NEUROLOGIC: Motor and sensation is grossly normal. Cranial nerves are grossly intact. PSYCH:  Oriented to person, place  and time. Affect is normal.  Data Reviewed  I have personally reviewed the patient's imaging, laboratory findings and medical records.    Assessment/Plan 66 year old female with recurrent reflux symptoms as well has an esophageal erosion with a large recurrent paraesophageal hernia.  She had a prior Nissen fundoplication and paraesophageal hernia repair in an open fashion x2. She would like to explore different options.  I will like to start work-up with a barium swallow and a CT scan of the abdomen and pelvis.  As she has gastroparesis I do think that it will be prudent to assess emptying of the stomach.  I was very candid with him and I am skeptical about a potential redo redo fundoplication and paraesophageal hernia repair fixing anything.  He may certainly do more harm since she is at higher risk for any injuries to the esophagus.  I do see that she was seen at [facility] at some point time and this case might be a better fit for a tertiary center.  I will be happy to follow her after she completes the studies.  She has a full understanding. Please note that I spent greater than 70 minutes in this encounter including coordination of her care, personally reviewing extensive medical records, personally reviewing imaging studies, placing orders and performing appropriate documentation.   Caroleen Hamman, MD FACS General Surgeon 05/22/2021, 10:48 AM

## 2021-05-26 DIAGNOSIS — M810 Age-related osteoporosis without current pathological fracture: Secondary | ICD-10-CM | POA: Diagnosis not present

## 2021-05-27 ENCOUNTER — Ambulatory Visit: Payer: Medicare Other | Admitting: Internal Medicine

## 2021-06-02 ENCOUNTER — Other Ambulatory Visit: Payer: Self-pay | Admitting: Internal Medicine

## 2021-06-03 ENCOUNTER — Other Ambulatory Visit: Payer: Self-pay

## 2021-06-03 ENCOUNTER — Ambulatory Visit
Admission: RE | Admit: 2021-06-03 | Discharge: 2021-06-03 | Disposition: A | Discharge status: Home / Self Care | Payer: Medicare Other | Source: Ambulatory Visit | Attending: Surgery | Admitting: Surgery

## 2021-06-03 DIAGNOSIS — K760 Fatty (change of) liver, not elsewhere classified: Secondary | ICD-10-CM | POA: Diagnosis not present

## 2021-06-03 DIAGNOSIS — K5730 Diverticulosis of large intestine without perforation or abscess without bleeding: Secondary | ICD-10-CM | POA: Diagnosis not present

## 2021-06-03 DIAGNOSIS — K449 Diaphragmatic hernia without obstruction or gangrene: Secondary | ICD-10-CM | POA: Diagnosis not present

## 2021-06-03 LAB — POCT I-STAT CREATININE: Creatinine, Ser: 0.7 mg/dL (ref 0.44–1.00)

## 2021-06-03 MED ORDER — IOHEXOL 300 MG/ML  SOLN
100.0000 mL | Freq: Once | INTRAMUSCULAR | Status: AC | PRN
  Administered 2021-06-03: 100 mL via INTRAVENOUS

## 2021-06-09 ENCOUNTER — Ambulatory Visit
Admission: RE | Admit: 2021-06-09 | Discharge: 2021-06-09 | Disposition: A | Discharge status: Home / Self Care | Payer: Medicare Other | Source: Ambulatory Visit | Attending: Nurse Practitioner | Admitting: Nurse Practitioner

## 2021-06-09 ENCOUNTER — Ambulatory Visit (INDEPENDENT_AMBULATORY_CARE_PROVIDER_SITE_OTHER): Payer: Medicare Other | Admitting: Nurse Practitioner

## 2021-06-09 ENCOUNTER — Other Ambulatory Visit: Payer: Self-pay

## 2021-06-09 ENCOUNTER — Encounter: Payer: Self-pay | Admitting: Nurse Practitioner

## 2021-06-09 ENCOUNTER — Ambulatory Visit
Admission: RE | Admit: 2021-06-09 | Discharge: 2021-06-09 | Disposition: A | Discharge status: Home / Self Care | Payer: Medicare Other | Attending: Nurse Practitioner | Admitting: Nurse Practitioner

## 2021-06-09 VITALS — BP 128/62 | HR 113 | Temp 97.5°F | Resp 16 | Ht 63.0 in | Wt 170.0 lb

## 2021-06-09 DIAGNOSIS — R0602 Shortness of breath: Secondary | ICD-10-CM

## 2021-06-09 DIAGNOSIS — K449 Diaphragmatic hernia without obstruction or gangrene: Secondary | ICD-10-CM | POA: Diagnosis not present

## 2021-06-09 DIAGNOSIS — R011 Cardiac murmur, unspecified: Secondary | ICD-10-CM

## 2021-06-09 DIAGNOSIS — I447 Left bundle-branch block, unspecified: Secondary | ICD-10-CM | POA: Diagnosis not present

## 2021-06-09 DIAGNOSIS — R002 Palpitations: Secondary | ICD-10-CM

## 2021-06-09 NOTE — Progress Notes (Signed)
[facility] ?[address] ?[place], [place] [address] ? ?Internal MEDICINE  ?Office Visit Note ? ?Patient Name: Summer Murphy ? [id number]  ?[id number] ? ?Date of Service: 06/09/2021 ? ?Chief Complaint  ?Patient presents with  ? Shortness of Breath  ? ? ? ?HPI ?Summer Murphy presents for an acute sick visit for SOBx1 month. Most of the time continuously. Randomly gets worse. Having Palpitations. Uses rescue inhaler and takes anxiety pill but only helps a little.  ?On combivent - rescue ?On breo - maintenance ?Has COPD and asthma, hiatal hernia - going through testing now.  ?Mild TR on echo ?PFT ordered  ?EKG done in office today that showed left bundle branch block and possible left atrial enlargement noted. Does not see cardiology regularly.  ? ? ?Current Medication: ? ?Outpatient Encounter Medications as of 06/09/2021  ?Medication Sig  ? acetaminophen (TYLENOL) 500 MG tablet Take 2 tablets (1,000 mg total) by mouth every 6 (six) hours.  ? busPIRone (BUSPAR) 10 MG tablet Take 10 mg by mouth 2 (two) times daily.  ? clonazePAM (KLONOPIN) 0.5 MG tablet Take 0.5 mg by mouth at bedtime.  ? diclofenac Sodium (VOLTAREN) 1 % GEL Apply 4 g topically 4 (four) times daily.  ? diltiazem (DILACOR XR) 180 MG 24 hr capsule TAKE 1 CAPSULE(180 MG) BY MOUTH DAILY  ? diphenhydrAMINE (BENADRYL) 25 MG tablet Take 50 mg by mouth 2 (two) times daily as needed for allergies.  ? diphenoxylate-atropine (LOMOTIL) 2.5-0.025 MG tablet Take 1 tablet by mouth 3 (three) times daily as needed for diarrhea or loose stools.  ? DULoxetine (CYMBALTA) 60 MG capsule Take 60 mg by mouth at bedtime.  ? EPINEPHrine 0.3 mg/0.3 mL IJ SOAJ injection Inject 0.3 mg into the muscle as needed for anaphylaxis.  ? fluticasone (FLONASE) 50 MCG/ACT nasal spray Place 2 sprays into both nostrils daily.  ? fluticasone furoate-vilanterol (BREO ELLIPTA) 100-25 MCG/ACT AEPB Inhale 1 puff into the lungs daily.  ? Ipratropium-Albuterol (COMBIVENT RESPIMAT) 20-100  MCG/ACT AERS respimat INHALE 1 PUFF BY MOUTH INTO THE LUNGS EVERY 6 HOURS  ? ipratropium-albuterol (DUONEB) 0.5-2.5 (3) MG/3ML SOLN Take 3 mLs by nebulization every 4 (four) hours as needed (for shortness of breath/wheezing).  ? metoprolol tartrate (LOPRESSOR) 25 MG tablet Take 1 tablet (25 mg total) by mouth 2 (two) times daily.  ? montelukast (SINGULAIR) 10 MG tablet Take 1 tablet (10 mg total) by mouth at bedtime.  ? ondansetron (ZOFRAN) 4 MG tablet Take one tab 2 x aday as needed  ? oxybutynin (DITROPAN) 5 MG tablet TAKE 1 TABLET( 5 MG TOTAL) BY MOUTH IN THE MORNING AND AT BEDTIME  ? pantoprazole (PROTONIX) 40 MG tablet Take 40 mg by mouth 2 (two) times daily.  ? rOPINIRole (REQUIP) 0.5 MG tablet Take two tablets po BID for restless legs.  ? sennosides-docusate sodium (SENOKOT-S) 8.6-50 MG tablet Take 2 tablets by mouth daily.  ? SUMAtriptan (IMITREX) 100 MG tablet Take 1 tablet (100 mg total) by mouth every 2 (two) hours as needed (for migraine headaches.). May repeat in 1 hours if headache persists or recurs.  ? traZODone (DESYREL) 150 MG tablet Take 150 mg by mouth at bedtime.  ? valACYclovir (VALTREX) 500 MG tablet Take 1 tablet (500 mg total) by mouth daily as needed (for fever blisters).  ? [DISCONTINUED] gabapentin (NEURONTIN) 600 MG tablet TAKE 1 TABLET BY MOUTH TWICE DAILY  ? [DISCONTINUED] loratadine (CLARITIN) 10 MG tablet Take 1 tablet (10 mg total) by mouth daily.  ? ?  No facility-administered encounter medications on file as of 06/09/2021.  ? ? ? ? ?Medical History: ?Past Medical History:  ?Diagnosis Date  ? Acid reflux   ? Anemia   ? Anxiety   ? Arrhythmia   ? treated with meds and has no current problems  ? Arthritis   ? most uncomfortable in knees  ? Asthma   ? uses several inhalers  ? Depression   ? Fever blister   ? Hematuria   ? History of kidney stones   ? Hypertension   ? Hypoglycemia   ? Left flank pain   ? Migraine   ? Pre-diabetes   ? Restless leg   ? Yeast vaginitis   ? ? ? ?Vital  Signs: ?BP 128/62   Pulse (!) 113   Temp (!) 97.5 ?F (36.4 ?C)   Resp 16   Ht '5\' 3"'$  (1.6 m)   Wt 170 lb (77.1 kg)   SpO2 97%   BMI 30.11 kg/m?  ? ? ?Review of Systems  ?Constitutional:  Negative for chills, fatigue and unexpected weight change.  ?HENT:  Negative for congestion, rhinorrhea, sneezing and sore throat.   ?Eyes:  Negative for redness.  ?Respiratory:  Positive for shortness of breath. Negative for cough, chest tightness and wheezing.   ?Cardiovascular: Negative.  Negative for chest pain and palpitations.  ?Gastrointestinal:  Negative for abdominal pain, constipation, diarrhea, nausea and vomiting.  ?Genitourinary:  Negative for dysuria and frequency.  ?Musculoskeletal:  Negative for arthralgias, back pain, joint swelling and neck pain.  ?Skin:  Negative for rash.  ?Neurological: Negative.  Negative for tremors and numbness.  ?Hematological:  Negative for adenopathy. Does not bruise/bleed easily.  ?Psychiatric/Behavioral:  Negative for behavioral problems (Depression), sleep disturbance and suicidal ideas. The patient is not nervous/anxious.   ? ?Physical Exam ?Vitals reviewed.  ?Constitutional:   ?   General: She is not in acute distress. ?   Appearance: She is well-developed. She is obese. She is not ill-appearing.  ?HENT:  ?   Head: Normocephalic and atraumatic.  ?Eyes:  ?   Pupils: Pupils are equal, round, and reactive to light.  ?Cardiovascular:  ?   Rate and Rhythm: Normal rate and regular rhythm.  ?   Pulses: Normal pulses.  ?   Heart sounds: Normal heart sounds. No murmur heard. ?Pulmonary:  ?   Effort: Pulmonary effort is normal. No accessory muscle usage or respiratory distress.  ?   Breath sounds: Normal breath sounds. No decreased breath sounds, wheezing, rhonchi or rales.  ?Neurological:  ?   Mental Status: She is alert and oriented to person, place, and time.  ?Psychiatric:     ?   Mood and Affect: Mood normal.     ?   Behavior: Behavior normal.  ? ? ? ? ?Assessment/Plan: ?1.  Palpitations ?EKG done, abnormal, refer to cardiology ?- EKG 12-Lead ?- Ambulatory referral to Cardiology ? ?2. Murmur ?Refer to cardiology ?- Ambulatory referral to Cardiology ? ?3. Left bundle branch block ?Refer to cardiology ?- Ambulatory referral to Cardiology ? ?4. Shortness of breath ?Chest xray to rule out pneumonia ?- DG Chest 2 View; Future ?- Ambulatory referral to Cardiology ? ? ?General Counseling: Summer Murphy verbalizes understanding of the findings of todays visit and agrees with plan of treatment. I have discussed any further diagnostic evaluation that may be needed or ordered today. We also reviewed her medications today. she has been encouraged to call the office with any questions or concerns that should arise  related to todays visit. ? ? ? ?Counseling: ? ? ? ?Orders Placed This Encounter  ?Procedures  ? DG Chest 2 View  ? Ambulatory referral to Cardiology  ? EKG 12-Lead  ? ? ?No orders of the defined types were placed in this encounter. ? ? ?Return if symptoms worsen or fail to improve; referred to cardiology for further evaluation. ? ?[place] Controlled Substance Database was reviewed by me for overdose risk score (ORS) ? ?Time spent:30 Minutes ?Time spent with patient included reviewing progress notes, labs, imaging studies, and discussing plan for follow up.  ? ?This patient was seen by Jonetta Osgood, FNP-C in collaboration with Dr. Clayborn Bigness as a part of collaborative care agreement. ? ?Dhrithi Riche R. Valetta Fuller, MSN, FNP-C ?Internal Medicine ?

## 2021-06-11 ENCOUNTER — Ambulatory Visit
Admission: RE | Admit: 2021-06-11 | Discharge: 2021-06-11 | Disposition: A | Discharge status: Home / Self Care | Payer: Medicare Other | Source: Ambulatory Visit | Attending: Surgery | Admitting: Surgery

## 2021-06-11 DIAGNOSIS — K224 Dyskinesia of esophagus: Secondary | ICD-10-CM | POA: Diagnosis not present

## 2021-06-11 DIAGNOSIS — K449 Diaphragmatic hernia without obstruction or gangrene: Secondary | ICD-10-CM | POA: Insufficient documentation

## 2021-06-11 DIAGNOSIS — K219 Gastro-esophageal reflux disease without esophagitis: Secondary | ICD-10-CM | POA: Diagnosis not present

## 2021-06-11 NOTE — Telephone Encounter (Signed)
Patient notified of CT results and reminded to keep appointment. ?

## 2021-06-12 ENCOUNTER — Encounter: Payer: Self-pay | Admitting: Cardiovascular Disease

## 2021-06-12 ENCOUNTER — Other Ambulatory Visit: Payer: Self-pay

## 2021-06-12 ENCOUNTER — Ambulatory Visit (INDEPENDENT_AMBULATORY_CARE_PROVIDER_SITE_OTHER): Payer: Medicare Other | Admitting: Cardiovascular Disease

## 2021-06-12 VITALS — BP 130/62 | HR 83 | Ht 63.0 in | Wt 170.0 lb

## 2021-06-12 DIAGNOSIS — J301 Allergic rhinitis due to pollen: Secondary | ICD-10-CM

## 2021-06-12 DIAGNOSIS — I209 Angina pectoris, unspecified: Secondary | ICD-10-CM | POA: Diagnosis not present

## 2021-06-12 DIAGNOSIS — J45909 Unspecified asthma, uncomplicated: Secondary | ICD-10-CM | POA: Diagnosis not present

## 2021-06-12 DIAGNOSIS — J449 Chronic obstructive pulmonary disease, unspecified: Secondary | ICD-10-CM | POA: Diagnosis not present

## 2021-06-12 DIAGNOSIS — R0602 Shortness of breath: Secondary | ICD-10-CM

## 2021-06-12 DIAGNOSIS — R072 Precordial pain: Secondary | ICD-10-CM | POA: Diagnosis not present

## 2021-06-12 DIAGNOSIS — E669 Obesity, unspecified: Secondary | ICD-10-CM

## 2021-06-12 MED ORDER — LORATADINE 10 MG PO TABS: 10.0000 mg | ORAL_TABLET | Freq: Every day | ORAL | 11 refills | Status: DC

## 2021-06-12 NOTE — Patient Instructions (Addendum)
Medication Instructions:  ?No changes ? ?If you need a refill on your cardiac medications before your next appointment, please call your pharmacy.  ? ?Lab work: ?No new labs needed ? ?Testing/Procedures: ? ?1) Lexiscan Myoview (Chemical Stress Test/ Cardiac Nuclear Scan) ? ?Your physician has requested that you have a lexiscan myoview.  ? ?[place] ? ?Your caregiver has ordered a Stress Test with nuclear imaging. The purpose of this test is to evaluate the blood supply to your heart muscle. This procedure is referred to as a "Non-Invasive Stress Test." This is because other than having an IV started in your vein, nothing is inserted or "invades" your body. Cardiac stress tests are done to find areas of poor blood flow to the heart by determining the extent of coronary artery disease (CAD). Some patients exercise on a treadmill, which naturally increases the blood flow to your heart, while others who are  unable to walk on a treadmill due to physical limitations have a pharmacologic/chemical stress agent called Lexiscan . This medicine will mimic walking on a treadmill by temporarily increasing your coronary blood flow.  ? ?Please note: these test may take anywhere between 2-4 hours to complete ? ?PLEASE REPORT TO [facility] MEDICAL MALL ENTRANCE  ?THE VOLUNTEERS AT THE FIRST DESK WILL DIRECT YOU WHERE TO GO ? ?Date of Procedure:_____________________________________ ? ?Arrival Time for Procedure:______________________________ ? ?Instructions regarding medication:  ? ?__x__:  You may take all of your regular morning medications the day of your test with enough water to get them down safely. ? ?PLEASE NOTIFY THE OFFICE AT LEAST 24 HOURS IN ADVANCE IF YOU ARE UNABLE TO KEEP YOUR APPOINTMENT.  [phone] ?AND  ?PLEASE NOTIFY NUCLEAR MEDICINE AT [facility] AT LEAST 20 HOURS IN ADVANCE IF YOU ARE UNABLE TO KEEP YOUR APPOINTMENT. [phone] ? ?How to prepare for your Myoview test: ? ?Do not eat or drink after midnight ?No  caffeine for 24 hours prior to test ?No smoking 24 hours prior to test. ?Your medication may be taken with water.  If your doctor stopped a medication because of this test, do not take that medication. ?Ladies, please do not wear dresses.  Skirts or pants are appropriate. Please wear a short sleeve shirt. ?No perfume, cologne or lotion. ?Wear comfortable walking shoes. No heels! ? ? ?Follow-Up: ?At [facility], you and your health needs are our priority.  As part of our continuing mission to provide you with exceptional heart care, we have created designated Provider Care Teams.  These Care Teams include your primary Cardiologist (physician) and Advanced Practice Providers (APPs -  Physician Assistants and Nurse Practitioners) who all work together to provide you with the care you need, when you need it. ? ?You will need a follow up appointment as needed ? ?Providers on your designated Care Team:   ?Murray Hodgkins, NP ?Christell Faith, PA-C ?Cadence Kathlen Mody, PA-C ? ?COVID-19 Vaccine Information can be found at: [email or web address].uk For questions related to vaccine distribution or appointments, please email [email or web address] or call [phone].  ? ? ?Cardiac Nuclear Scan ?A cardiac nuclear scan is a test that is done to check the flow of blood to your heart. It is done when you are resting and when you are exercising. The test looks for problems such as: ?Not enough blood reaching a portion of the heart. ?The heart muscle not working as it should. ?You may need this test if: ?You have heart disease. ?You have had lab results that are not normal. ?You have had heart surgery  or a balloon procedure to open up blocked arteries (angioplasty). ?You have chest pain. ?You have shortness of breath. ?In this test, a special dye (tracer) is put into your bloodstream. The tracer will travel to your heart. A camera will then take pictures of your heart to see how the  tracer moves through your heart. This test is usually done at a hospital and takes 2-4 hours. ?Tell a doctor about: ?Any allergies you have. ?All medicines you are taking, including vitamins, herbs, eye drops, creams, and over-the-counter medicines. ?Any problems you or family members have had with anesthetic medicines. ?Any blood disorders you have. ?Any surgeries you have had. ?Any medical conditions you have. ?Whether you are pregnant or may be pregnant. ?What are the risks? ?Generally, this is a safe test. However, problems may occur, such as: ?Serious chest pain and heart attack. This is only a risk if the stress portion of the test is done. ?Rapid heartbeat. ?A feeling of warmth in your chest. This feeling usually does not last long. ?Allergic reaction to the tracer. ?What happens before the test? ?Ask your doctor about changing or stopping your normal medicines. This is important. ?Follow instructions from your doctor about what you cannot eat or drink. ?Remove your jewelry on the day of the test. ?What happens during the test? ?An IV tube will be inserted into one of your veins. ?Your doctor will give you a small amount of tracer through the IV tube. ?You will wait for 20-40 minutes while the tracer moves through your bloodstream. ?Your heart will be monitored with an electrocardiogram (ECG). ?You will lie down on an exam table. ?Pictures of your heart will be taken for about 15-20 minutes. ?You may also have a stress test. For this test, one of these things may be done: ?You will be asked to exercise on a treadmill or a stationary bike. ?You will be given medicines that will make your heart work harder. This is done if you are unable to exercise. ?When blood flow to your heart has peaked, a tracer will again be given through the IV tube. ?After 20-40 minutes, you will get back on the exam table. More pictures will be taken of your heart. ?Depending on the tracer that is used, more pictures may need to be  taken 3-4 hours later. ?Your IV tube will be removed when the test is over. ?The test may vary among doctors and hospitals. ?What happens after the test? ?Ask your doctor: ?Whether you can return to your normal schedule, including diet, activities, and medicines. ?Whether you should drink more fluids. This will help to remove the tracer from your body. Drink enough fluid to keep your pee (urine) pale yellow. ?Ask your doctor, or the department that is doing the test: ?When will my results be ready? ?How will I get my results? ?Summary ?A cardiac nuclear scan is a test that is done to check the flow of blood to your heart. ?Tell your doctor whether you are pregnant or may be pregnant. ?Before the test, ask your doctor about changing or stopping your normal medicines. This is important. ?Ask your doctor whether you can return to your normal activities. You may be asked to drink more fluids. ?This information is not intended to replace advice given to you by your health care provider. Make sure you discuss any questions you have with your health care provider. ?Document Revised: 11/20/2020 Document Reviewed: 08/21/2020 ?Elsevier Patient Education ? [place]. ? ?

## 2021-06-12 NOTE — Progress Notes (Signed)
Cardiology Office Note ? ?Date:  06/12/2021  ? ?ID:  Summer Murphy, DOB [birth year], MRN [id number] ? ?PCP:  Lavera Guise, MD  ? ?Chief Complaint  ?Patient presents with  ? New Patient (Initial Visit)  ?  Referred by PCP for PCP for palpitations, Murmur, SOB with exertion, and LBBB. Meds reviewed verbally with patient.   ? ? ?HPI:  ?Summer Murphy is a 66 year old woman with past medical history of ?Hypertension ?Prediabetes ?Obesity ?left bundle branch block ?COPD /asthma, chronic SOB ?Who presents by referral from [facility] for consultation of her shortness of breath, murmur, left bundle branch block, palpitations ? ?Review of history details Long history of left bundle branch block ?She was told that she had a murmur, echocardiogram was performed ? ?Echocardiogram results reviewed ?Study dated April 21, 2021 ?Normal ejection fraction, no significant valvular heart disease noted ? ?On further discussion she reports 1 month SOB ?Symptoms have been significant, does not feel they are secondary to asthma ?Unable to walk very far without having to stop secondary to shortness of breath ?Does not use oxygen ?Breathing is not improved with inhalers ?She is followed by pulmonary, has PFTs scheduled ? ?Recent EKG performed as outpatient showing normal sinus rhythm rate 96 bpm left bundle branch block ?No change from prior EKGs ? ? ?PMH:   has a past medical history of Acid reflux, Anemia, Anxiety, Arrhythmia, Arthritis, Asthma, Depression, Fever blister, Hematuria, History of kidney stones, Hypertension, Hypoglycemia, Left flank pain, Migraine, Pre-diabetes, Restless leg, and Yeast vaginitis. ? ?PSH:    ?Past Surgical History:  ?Procedure Laterality Date  ? ABDOMINAL HYSTERECTOMY    ? AMPUTATION TOE Left 07/31/2016  ? Procedure: AMPUTATION TOE/MPJ 2nd toe;  Surgeon: Sharlotte Alamo, DPM;  Location: [facility] ORS;  Service: Podiatry;  Laterality: Left;  ? APPENDECTOMY  1990  ? BACK SURGERY    ? low back  ? BREAST  SURGERY    ? bilateral breast reduction  ? CARDIAC ELECTROPHYSIOLOGY STUDY AND ABLATION    ? CHOLECYSTECTOMY  1990  ? COLONOSCOPY WITH PROPOFOL N/A 01/04/2017  ? Procedure: COLONOSCOPY WITH PROPOFOL;  Surgeon: Manya Silvas, MD;  Location: [facility] ENDOSCOPY;  Service: Endoscopy;  Laterality: N/A;  ? CORNEAL TRANSPLANT    ? ESOPHAGOGASTRODUODENOSCOPY N/A 04/09/2021  ? Procedure: ESOPHAGOGASTRODUODENOSCOPY (EGD);  Surgeon: Lin Landsman, MD;  Location: [facility] ENDOSCOPY;  Service: Gastroenterology;  Laterality: N/A;  ? ESOPHAGOGASTRODUODENOSCOPY (EGD) WITH PROPOFOL N/A 01/04/2017  ? Procedure: ESOPHAGOGASTRODUODENOSCOPY (EGD) WITH PROPOFOL;  Surgeon: Manya Silvas, MD;  Location: [facility] ENDOSCOPY;  Service: Endoscopy;  Laterality: N/A;  ? EXCISION BONE CYST Left 07/31/2016  ? Procedure: EXCISION BONE CYST/exostectomy 28124/left 2nd;  Surgeon: Sharlotte Alamo, DPM;  Location: [facility] ORS;  Service: Podiatry;  Laterality: Left;  ? EXTRACORPOREAL SHOCK WAVE LITHOTRIPSY Left 09/12/2015  ? Procedure: EXTRACORPOREAL SHOCK WAVE LITHOTRIPSY (ESWL);  Surgeon: Hollice Espy, MD;  Location: [facility] ORS;  Service: Urology;  Laterality: Left;  ? FRACTURE SURGERY    ? left foot  ? HARDWARE REMOVAL Left 11/14/2020  ? Procedure: HARDWARE REMOVAL;  Surgeon: Hessie Knows, MD;  Location: [facility] ORS;  Service: Orthopedics;  Laterality: Left;  ? [place] repair    ? Fundoplication  ? JOINT REPLACEMENT Bilateral 2013,2014  ? total knees  ? LAPAROSCOPIC HYSTERECTOMY    ? LITHOTRIPSY    ? periprosthetic supracondylar fracture of left femur  02/16/2020  ? [facility]  ? TONSILLECTOMY    ? TOTAL KNEE REVISION Right 11/14/2019  ? Procedure: Revision patella  and tibial polyethylene;  Surgeon: Hessie Knows, MD;  Location: [facility] ORS;  Service: Orthopedics;  Laterality: Right;  ? URETEROSCOPY    ? ? ?Current Outpatient Medications  ?Medication Sig Dispense Refill  ? acetaminophen (TYLENOL) 500 MG tablet Take 2 tablets (1,000 mg total) by mouth every 6  (six) hours. 30 tablet 0  ? busPIRone (BUSPAR) 10 MG tablet Take 10 mg by mouth 2 (two) times daily.    ? clonazePAM (KLONOPIN) 0.5 MG tablet Take 0.5 mg by mouth at bedtime.    ? diclofenac Sodium (VOLTAREN) 1 % GEL Apply 4 g topically 4 (four) times daily. 350 g 1  ? diltiazem (DILACOR XR) 180 MG 24 hr capsule TAKE 1 CAPSULE(180 MG) BY MOUTH DAILY 90 capsule 1  ? diphenhydrAMINE (BENADRYL) 25 MG tablet Take 50 mg by mouth 2 (two) times daily as needed for allergies.    ? diphenoxylate-atropine (LOMOTIL) 2.5-0.025 MG tablet Take 1 tablet by mouth 3 (three) times daily as needed for diarrhea or loose stools. 30 tablet 0  ? DULoxetine (CYMBALTA) 60 MG capsule Take 60 mg by mouth at bedtime.    ? EPINEPHrine 0.3 mg/0.3 mL IJ SOAJ injection Inject 0.3 mg into the muscle as needed for anaphylaxis. 1 each 3  ? fluticasone (FLONASE) 50 MCG/ACT nasal spray Place 2 sprays into both nostrils daily. 16 g 6  ? fluticasone furoate-vilanterol (BREO ELLIPTA) 100-25 MCG/ACT AEPB Inhale 1 puff into the lungs daily. 1 each 11  ? gabapentin (NEURONTIN) 600 MG tablet TAKE 1 TABLET BY MOUTH TWICE DAILY 60 tablet 3  ? Ipratropium-Albuterol (COMBIVENT RESPIMAT) 20-100 MCG/ACT AERS respimat INHALE 1 PUFF BY MOUTH INTO THE LUNGS EVERY 6 HOURS 4 g 3  ? ipratropium-albuterol (DUONEB) 0.5-2.5 (3) MG/3ML SOLN Take 3 mLs by nebulization every 4 (four) hours as needed (for shortness of breath/wheezing). 360 mL 6  ? metoprolol tartrate (LOPRESSOR) 25 MG tablet Take 1 tablet (25 mg total) by mouth 2 (two) times daily. 180 tablet 1  ? montelukast (SINGULAIR) 10 MG tablet Take 1 tablet (10 mg total) by mouth at bedtime. 90 tablet 1  ? ondansetron (ZOFRAN) 4 MG tablet Take one tab 2 x aday as needed 45 tablet 1  ? oxybutynin (DITROPAN) 5 MG tablet TAKE 1 TABLET( 5 MG TOTAL) BY MOUTH IN THE MORNING AND AT BEDTIME 180 tablet 1  ? pantoprazole (PROTONIX) 40 MG tablet Take 40 mg by mouth 2 (two) times daily.    ? rOPINIRole (REQUIP) 0.5 MG tablet Take  two tablets po BID for restless legs. 360 tablet 1  ? sennosides-docusate sodium (SENOKOT-S) 8.6-50 MG tablet Take 2 tablets by mouth daily. 30 tablet 1  ? SUMAtriptan (IMITREX) 100 MG tablet Take 1 tablet (100 mg total) by mouth every 2 (two) hours as needed (for migraine headaches.). May repeat in 1 hours if headache persists or recurs. 10 tablet 3  ? traZODone (DESYREL) 150 MG tablet Take 150 mg by mouth at bedtime.    ? valACYclovir (VALTREX) 500 MG tablet Take 1 tablet (500 mg total) by mouth daily as needed (for fever blisters). 90 tablet 1  ? loratadine (CLARITIN) 10 MG tablet Take 1 tablet (10 mg total) by mouth daily. 30 tablet 11  ? ?No current facility-administered medications for this visit.  ? ? ? ?Allergies:   Librium [chlordiazepoxide], Metoclopramide, Vanilla, Aspirin, Nsaids, Azithromycin, Buprenorphine hcl, Chlordiazepoxide hcl, Morphine, Sulfa antibiotics, Tolmetin, Amlodipine besylate, and Iron  ? ?Social History:  The patient  reports that she  has never smoked. She has never used smokeless tobacco. She reports that she does not drink alcohol and does not use drugs.  ? ?Family History:   family history includes Breast cancer in her maternal aunt; Diabetes in her brother; Hypertension in her brother and mother; Kidney Stones in her father; Prostate cancer in her father; Stroke in her mother.  ? ? ?Review of Systems: ?Review of Systems  ?Constitutional: Negative.   ?HENT: Negative.    ?Respiratory:  Positive for shortness of breath.   ?Cardiovascular: Negative.   ?Gastrointestinal: Negative.   ?Musculoskeletal: Negative.   ?Neurological: Negative.   ?Psychiatric/Behavioral: Negative.    ?All other systems reviewed and are negative. ? ? ?PHYSICAL EXAM: ?VS:  BP 130/62 (BP Location: Left Arm, Patient Position: Sitting, Cuff Size: Normal)   Pulse 83   Ht '5\' 3"'$  (1.6 m)   Wt 170 lb (77.1 kg)   SpO2 96%   BMI 30.11 kg/m?  , BMI Body mass index is 30.11 kg/m?. ?GEN: Well nourished, well developed, in  no acute distress ?HEENT: normal ?Neck: no JVD, carotid bruits, or masses ?Cardiac: RRR; no murmurs, rubs, or gallops,no edema  ?Respiratory:  clear to auscultation bilaterally, normal work of breathing ?GI: soft,

## 2021-06-13 ENCOUNTER — Telehealth: Payer: Self-pay

## 2021-06-13 NOTE — Progress Notes (Signed)
Please call patient and let her know the results of her chest x-ray: Chest x-ray shows the hiatal hernia with no other abnormal findings that would provide an explanation for the shortness of breath.

## 2021-06-13 NOTE — Telephone Encounter (Signed)
-----   Message from Jonetta Osgood, NP sent at 06/13/2021  2:00 PM EDT ----- ?Please call patient and let her know the results of her chest x-ray: Chest x-ray shows the hiatal hernia with no other abnormal findings that would provide an explanation for the shortness of breath. ?

## 2021-06-13 NOTE — Telephone Encounter (Signed)
Pt wanted to know what to to about her SOB. Spoke to [place], she said that pt's cardiologist needs to rule out their stuff first, it might be multifactorial, but if cardiologist rules out everything then Summer Murphy may order chest CT. Tried to call pt, LMOM. ?

## 2021-06-16 DIAGNOSIS — M79662 Pain in left lower leg: Secondary | ICD-10-CM | POA: Diagnosis not present

## 2021-06-16 DIAGNOSIS — M80052S Age-related osteoporosis with current pathological fracture, left femur, sequela: Secondary | ICD-10-CM | POA: Diagnosis not present

## 2021-06-16 DIAGNOSIS — Z9889 Other specified postprocedural states: Secondary | ICD-10-CM | POA: Diagnosis not present

## 2021-06-16 NOTE — Telephone Encounter (Signed)
Spoke to pt, provided info explaining that her cardiologist needs to finish their testing before Alyssa can reassess the pt.  ?

## 2021-06-19 ENCOUNTER — Other Ambulatory Visit: Payer: Self-pay | Admitting: Nurse Practitioner

## 2021-06-19 DIAGNOSIS — G63 Polyneuropathy in diseases classified elsewhere: Secondary | ICD-10-CM

## 2021-06-20 ENCOUNTER — Encounter: Payer: Self-pay | Admitting: Nurse Practitioner

## 2021-06-20 ENCOUNTER — Ambulatory Visit
Admission: RE | Admit: 2021-06-20 | Discharge: 2021-06-20 | Disposition: A | Discharge status: Home / Self Care | Payer: Medicare Other | Source: Ambulatory Visit | Attending: Surgery | Admitting: Surgery

## 2021-06-20 DIAGNOSIS — K449 Diaphragmatic hernia without obstruction or gangrene: Secondary | ICD-10-CM | POA: Diagnosis not present

## 2021-06-20 MED ORDER — TECHNETIUM TC 99M SULFUR COLLOID
2.1100 | Freq: Once | INTRAVENOUS | Status: AC | PRN
  Administered 2021-06-20: 2.11 via ORAL

## 2021-06-20 MED ORDER — TECHNETIUM TC 99M SULFUR COLLOID FILTERED: 2.1100 | Freq: Once | INTRAVENOUS | Status: DC | PRN

## 2021-06-23 ENCOUNTER — Ambulatory Visit (INDEPENDENT_AMBULATORY_CARE_PROVIDER_SITE_OTHER): Payer: Medicare Other | Admitting: Surgery

## 2021-06-23 ENCOUNTER — Encounter: Payer: Self-pay | Admitting: Surgery

## 2021-06-23 VITALS — BP 144/80 | HR 98 | Temp 98.9°F | Ht 63.0 in | Wt 170.0 lb

## 2021-06-23 DIAGNOSIS — K449 Diaphragmatic hernia without obstruction or gangrene: Secondary | ICD-10-CM

## 2021-06-23 NOTE — Progress Notes (Signed)
Outpatient Surgical Follow Up ? ?06/23/2021 ? ?Summer Murphy is an 66 y.o. female.  ? ?Chief Complaint  ?Patient presents with  ? Follow-up  ? ? ?HPI: Summer Murphy is a 66 y.o. female sfollowing up for recurrent paraesophageal hernia.   PMH significant for asthma, COPD HTN, DM,depression, back pain, Barrett's esophagus s/p ablation, bulemia and GERD s/p nissen fundoplication x 2 who presents to clinic with heartburn. Seen at both [place] before, ?Prior history of Barrett's esophagus and low-grade dysplasia with history of ablation.   ?She does have also history of gastroparesis ?  ?She is states that she had a Nissen fundoplication decades ago , 30 years or so I believe it was open.  SHe is unable to recall the details.  I have looked at the notes from Dr. Precious Bard 2017 and apparently she did had Nissen fundoplication's in an open fashion several decades ago. At  1 point in time she also had erosions at the GE junction associated with a hiatal hernia. ?Notes from 6 years ago from Dr. Precious Bard stated that they were evaluating for a potential subtotal gastrectomy. ?sHe endorses dyspnea on exertion and chronic cough as well as retrosternal pain and reflux. ?Most recently 6 weeks ago she underwent EGD with esophageal dilation by Dr. Marius Ditch.  She did have an stricture.  A large hiatal hernia was seen with an esophageal erosion.  Is not that I personally review endoscopic images.  He also had a gastric emptying study 2 years ago showing evidence of gastroparesis. She  Did have a CT scan few years ago that have personally reviewed showing evidence of paraesophageal hernia with postoperative changes in the mediastinum. ?Surgical abdominal history is extensive consistent with a hiatal hernia x2 in an open fashion, hysterectomy, cholecystectomy, appendectomy ?She completed a barium swallow as well as a gastric emptying study that have personally reviewed.  She does have significant gastroparesis.  Also have some  dysmotility on the large paraesophageal hernia with reflux. ? ? ?  ? ?Past Medical History:  ?Diagnosis Date  ? Acid reflux   ? Anemia   ? Anxiety   ? Arrhythmia   ? treated with meds and has no current problems  ? Arthritis   ? most uncomfortable in knees  ? Asthma   ? uses several inhalers  ? Depression   ? Fever blister   ? Hematuria   ? History of kidney stones   ? Hypertension   ? Hypoglycemia   ? Left flank pain   ? Migraine   ? Pre-diabetes   ? Restless leg   ? Yeast vaginitis   ? ? ?Past Surgical History:  ?Procedure Laterality Date  ? ABDOMINAL HYSTERECTOMY    ? AMPUTATION TOE Left 07/31/2016  ? Procedure: AMPUTATION TOE/MPJ 2nd toe;  Surgeon: Sharlotte Alamo, DPM;  Location: [facility] ORS;  Service: Podiatry;  Laterality: Left;  ? APPENDECTOMY  1990  ? BACK SURGERY    ? low back  ? BREAST SURGERY    ? bilateral breast reduction  ? CARDIAC ELECTROPHYSIOLOGY STUDY AND ABLATION    ? CHOLECYSTECTOMY  1990  ? COLONOSCOPY WITH PROPOFOL N/A 01/04/2017  ? Procedure: COLONOSCOPY WITH PROPOFOL;  Surgeon: Manya Silvas, MD;  Location: [facility] ENDOSCOPY;  Service: Endoscopy;  Laterality: N/A;  ? CORNEAL TRANSPLANT    ? ESOPHAGOGASTRODUODENOSCOPY N/A 04/09/2021  ? Procedure: ESOPHAGOGASTRODUODENOSCOPY (EGD);  Surgeon: Lin Landsman, MD;  Location: [facility] ENDOSCOPY;  Service: Gastroenterology;  Laterality: N/A;  ? ESOPHAGOGASTRODUODENOSCOPY (EGD)  WITH PROPOFOL N/A 01/04/2017  ? Procedure: ESOPHAGOGASTRODUODENOSCOPY (EGD) WITH PROPOFOL;  Surgeon: Manya Silvas, MD;  Location: [facility] ENDOSCOPY;  Service: Endoscopy;  Laterality: N/A;  ? EXCISION BONE CYST Left 07/31/2016  ? Procedure: EXCISION BONE CYST/exostectomy 28124/left 2nd;  Surgeon: Sharlotte Alamo, DPM;  Location: [facility] ORS;  Service: Podiatry;  Laterality: Left;  ? EXTRACORPOREAL SHOCK WAVE LITHOTRIPSY Left 09/12/2015  ? Procedure: EXTRACORPOREAL SHOCK WAVE LITHOTRIPSY (ESWL);  Surgeon: Hollice Espy, MD;  Location: [facility] ORS;  Service: Urology;  Laterality: Left;  ?  FRACTURE SURGERY    ? left foot  ? HARDWARE REMOVAL Left 11/14/2020  ? Procedure: HARDWARE REMOVAL;  Surgeon: Hessie Knows, MD;  Location: [facility] ORS;  Service: Orthopedics;  Laterality: Left;  ? [place] repair    ? Fundoplication  ? JOINT REPLACEMENT Bilateral 2013,2014  ? total knees  ? LAPAROSCOPIC HYSTERECTOMY    ? LITHOTRIPSY    ? periprosthetic supracondylar fracture of left femur  02/16/2020  ? [facility]  ? TONSILLECTOMY    ? TOTAL KNEE REVISION Right 11/14/2019  ? Procedure: Revision patella and tibial polyethylene;  Surgeon: Hessie Knows, MD;  Location: [facility] ORS;  Service: Orthopedics;  Laterality: Right;  ? URETEROSCOPY    ? ? ?Family History  ?Problem Relation Age of Onset  ? Hypertension Mother   ? Stroke Mother   ? Prostate cancer Father   ? Kidney Stones Father   ? Diabetes Brother   ? Hypertension Brother   ? Breast cancer Maternal Aunt   ? Kidney disease Neg Hx   ? ? ?Social History:  reports that she has never smoked. She has been exposed to tobacco smoke. She has never used smokeless tobacco. She reports that she does not drink alcohol and does not use drugs. ? ?Allergies:  ?Allergies  ?Allergen Reactions  ? Librium [Chlordiazepoxide] Shortness Of Breath  ? Metoclopramide Hives and Other (See Comments)  ?  hallucinations ?  ? Vanilla Shortness Of Breath  ? Aspirin Hives  ? Nsaids Rash  ?  Rash/flares asthma issues.  ? Azithromycin Other (See Comments)  ?  Unsure of what the reaction was.  ? Buprenorphine Hcl Other (See Comments)  ?  Unsure of what the reaction was. ?  ? Chlordiazepoxide Hcl Other (See Comments)  ?  unsure  ? Morphine Other (See Comments)  ?  Unsure of reaction  ? Sulfa Antibiotics   ?  Other reaction(s): Unknown  ? Tolmetin   ?  Other Reaction: Allergy  ? Amlodipine Besylate Itching and Rash  ?  arms, stomach and forehead  ? Iron Nausea And Vomiting  ?  Other reaction(s): Unknown  ? ? ?Medications reviewed. ? ? ? ?ROS ?Full ROS performed and is otherwise negative other than what is  stated in HPI ? ? ?BP (!) 144/80   Pulse 98   Temp 98.9 ?F (37.2 ?C)   Ht '5\' 3"'$  (1.6 m)   Wt 170 lb (77.1 kg)   SpO2 97%   BMI 30.11 kg/m?  ? ?Physical Exam ? ?CONSTITUTIONAL: NAD. ?EYES: Pupils are equal, round,  Sclera are non-icteric. ?EARS, NOSE, MOUTH AND THROAT: sHe is wearing a mask hearing is intact to voice. ?LYMPH NODES:  Lymph nodes in the neck are normal. ?RESPIRATORY:  Lungs are clear. There is normal respiratory effort, with equal breath sounds bilaterally, and without pathologic use of accessory muscles. ?CARDIOVASCULAR: Heart is regular without murmurs, gallops, or rubs. ?GI: The abdomen is  soft, nontender, and nondistended. ?He has several  scars including a Mercedes scar.Indicating  a major foregut procedure there are no palpable masses. There is no hepatosplenomegaly. There are normal bowel sounds  ?GU: Rectal deferred.   ?MUSCULOSKELETAL: Normal muscle strength and tone. No cyanosis or edema.   ?SKIN: Turgor is good and there are no pathologic skin lesions or ulcers. ?NEUROLOGIC: Motor and sensation is grossly normal. Cranial nerves are grossly intact. ?PSYCH:  Oriented to person, place and time. Affect is normal. ?  ? ? ?Assessment/Plan: ?Recurrent large paraesophageal hernia in the setting of a patient with reflux and Barrett's esophagus.  She has already been seen by at [facility] as I said before they have attempted the repair of paraesophageal hernia twice in addition to this she has gastroparesis.  Very challenging case.  Definitely encouraged her to go back to [facility] since they were contemplating a potential gastrectomy.  We will likely benefit from at least a pyloromyotomy and [facility] may be able to offer an endoscopic approach.  From my community surgery perspective my options are limited and I was completely honest with her.  There is no need for emergent surgical intervention at this time.  She is appreciative.  Appropriate referrals were made.  Please note that I spent greater than 40 minutes  in this encounter including personally reviewing imaging studies, counseling the patient, placing orders and performing appropriate documentation ? ?Caroleen Hamman, MD FACS ?General Surgeon  ?

## 2021-06-23 NOTE — Patient Instructions (Addendum)
Referral faxed to [facility] GI and [facility] General surgery. Someone form their office will contact you to schedule an appointment. If you do not hear from them within 7 days please call an let us know. ? ?Summer Murphy [phone]  GI ? ?Dr.Timothy Margart Sickles [phone] General surgery ?

## 2021-06-24 ENCOUNTER — Telehealth: Payer: Self-pay

## 2021-06-24 NOTE — Telephone Encounter (Signed)
Referrals faxed to [facility] [phone] and Dr.Timothy farrell [phone].  ?

## 2021-06-27 ENCOUNTER — Encounter
Admission: RE | Admit: 2021-06-27 | Discharge: 2021-06-27 | Disposition: A | Discharge status: Home / Self Care | Payer: Medicare Other | Source: Ambulatory Visit | Attending: Cardiovascular Disease | Admitting: Cardiovascular Disease

## 2021-06-27 DIAGNOSIS — R072 Precordial pain: Secondary | ICD-10-CM | POA: Diagnosis not present

## 2021-06-27 LAB — NM MYOCAR MULTI W/SPECT W/WALL MOTION / EF
LV dias vol: 64 mL (ref 46–106)
LV sys vol: 17 mL
Nuc Stress EF: 73 %
Peak HR: 107 {beats}/min
Percent HR: 69 %
Rest HR: 71 {beats}/min
Rest Nuclear Isotope Dose: 10.2 mCi
ST Depression (mm): 0 mm
Stress Nuclear Isotope Dose: 30.7 mCi
TID: 1.11

## 2021-06-27 MED ORDER — REGADENOSON 0.4 MG/5ML IV SOLN
0.4000 mg | Freq: Once | INTRAVENOUS | Status: AC
Start: 2021-06-27 — End: 2021-06-27
  Administered 2021-06-27: 0.4 mg via INTRAVENOUS

## 2021-06-27 MED ORDER — TECHNETIUM TC 99M TETROFOSMIN IV KIT
10.0000 | PACK | Freq: Once | INTRAVENOUS | Status: AC | PRN
Start: 2021-06-27 — End: 2021-06-27
  Administered 2021-06-27: 10.22 via INTRAVENOUS

## 2021-06-27 MED ORDER — TECHNETIUM TC 99M TETROFOSMIN IV KIT
30.0000 | PACK | Freq: Once | INTRAVENOUS | Status: AC | PRN
Start: 2021-06-27 — End: 2021-06-27
  Administered 2021-06-27: 30.65 via INTRAVENOUS

## 2021-06-30 ENCOUNTER — Telehealth: Payer: Self-pay | Admitting: Emergency Medicine

## 2021-06-30 NOTE — Telephone Encounter (Signed)
-----   Message from Minna Merritts, MD sent at 06/29/2021  1:43 PM EDT ----- ?Stress test ?No significant ischemia ?Normal left ventricular function ?Low risk study ?

## 2021-06-30 NOTE — Telephone Encounter (Signed)
Called and spoke with patient. Results reviewed with patient, pt verbalized understanding,  questions (if any) answered.   ?

## 2021-07-01 ENCOUNTER — Ambulatory Visit (INDEPENDENT_AMBULATORY_CARE_PROVIDER_SITE_OTHER): Payer: Medicare Other | Admitting: Internal Medicine

## 2021-07-01 ENCOUNTER — Telehealth: Payer: Self-pay

## 2021-07-01 ENCOUNTER — Encounter: Payer: Self-pay | Admitting: Internal Medicine

## 2021-07-01 ENCOUNTER — Other Ambulatory Visit: Payer: Self-pay

## 2021-07-01 VITALS — BP 139/80 | HR 82 | Temp 98.3°F | Resp 16 | Ht 63.0 in | Wt 168.0 lb

## 2021-07-01 DIAGNOSIS — R7309 Other abnormal glucose: Secondary | ICD-10-CM | POA: Diagnosis not present

## 2021-07-01 DIAGNOSIS — D508 Other iron deficiency anemias: Secondary | ICD-10-CM | POA: Diagnosis not present

## 2021-07-01 DIAGNOSIS — L659 Nonscarring hair loss, unspecified: Secondary | ICD-10-CM | POA: Diagnosis not present

## 2021-07-01 DIAGNOSIS — K2100 Gastro-esophageal reflux disease with esophagitis, without bleeding: Secondary | ICD-10-CM | POA: Diagnosis not present

## 2021-07-01 DIAGNOSIS — F332 Major depressive disorder, recurrent severe without psychotic features: Secondary | ICD-10-CM

## 2021-07-01 MED ORDER — FAMOTIDINE 20 MG PO TABS
20.0000 mg | ORAL_TABLET | Freq: Two times a day (BID) | ORAL | 3 refills | Status: DC
Start: 2021-07-01 — End: 2022-04-26

## 2021-07-01 MED ORDER — RANITIDINE HCL 75 MG PO TABS: 75.0000 mg | ORAL_TABLET | Freq: Two times a day (BID) | ORAL | 3 refills | Status: DC

## 2021-07-01 NOTE — Progress Notes (Signed)
[facility] ?[address] ?[place], [place] [address] ? ?Internal MEDICINE  ?Office Visit Note ? ?Patient Name: Summer Murphy ? [id number]  ?[id number] ? ?Date of Service: 07/21/2021 ? ?Chief Complaint  ?Patient presents with  ? Follow-up  ? Depression  ? Gastroesophageal Reflux  ? Hypertension  ? Alopecia  ?  Pt is having hair loss - noticed hair falling out one week ago  ? ? ?HPI ? ?Patient is here for routine follow-up ?1.  She continues to have complaints of heartburn, she has been on Protonix bid, does have h/o Barrett's seen by GI on regular basis. She did have low Ferritin in the past  ?2.Blood pressure is slightly elevated as well ?3. Concerned about hair loss ?4.Patient continues to have complaints of heartburnPatient is on multiple medications for her recurrent depression ?5.Slightly abnormal baseline sugar ?Current Medication: ?Outpatient Encounter Medications as of 07/01/2021  ?Medication Sig  ? acetaminophen (TYLENOL) 500 MG tablet Take 2 tablets (1,000 mg total) by mouth every 6 (six) hours.  ? busPIRone (BUSPAR) 10 MG tablet Take 10 mg by mouth 2 (two) times daily.  ? clonazePAM (KLONOPIN) 0.5 MG tablet Take 0.5 mg by mouth at bedtime.  ? diclofenac Sodium (VOLTAREN) 1 % GEL Apply 4 g topically 4 (four) times daily.  ? diltiazem (DILACOR XR) 180 MG 24 hr capsule TAKE 1 CAPSULE(180 MG) BY MOUTH DAILY  ? diphenhydrAMINE (BENADRYL) 25 MG tablet Take 50 mg by mouth 2 (two) times daily as needed for allergies.  ? diphenoxylate-atropine (LOMOTIL) 2.5-0.025 MG tablet Take 1 tablet by mouth 3 (three) times daily as needed for diarrhea or loose stools.  ? DULoxetine (CYMBALTA) 60 MG capsule Take 60 mg by mouth at bedtime.  ? EPINEPHrine 0.3 mg/0.3 mL IJ SOAJ injection Inject 0.3 mg into the muscle as needed for anaphylaxis.  ? fluticasone (FLONASE) 50 MCG/ACT nasal spray Place 2 sprays into both nostrils daily.  ? fluticasone furoate-vilanterol (BREO ELLIPTA) 100-25 MCG/ACT AEPB Inhale 1 puff into  the lungs daily.  ? gabapentin (NEURONTIN) 600 MG tablet TAKE 1 TABLET BY MOUTH TWICE DAILY  ? Ipratropium-Albuterol (COMBIVENT RESPIMAT) 20-100 MCG/ACT AERS respimat INHALE 1 PUFF BY MOUTH INTO THE LUNGS EVERY 6 HOURS  ? ipratropium-albuterol (DUONEB) 0.5-2.5 (3) MG/3ML SOLN Take 3 mLs by nebulization every 4 (four) hours as needed (for shortness of breath/wheezing).  ? loratadine (CLARITIN) 10 MG tablet Take 1 tablet (10 mg total) by mouth daily.  ? metoprolol tartrate (LOPRESSOR) 25 MG tablet Take 1 tablet (25 mg total) by mouth 2 (two) times daily.  ? montelukast (SINGULAIR) 10 MG tablet Take 1 tablet (10 mg total) by mouth at bedtime.  ? ondansetron (ZOFRAN) 4 MG tablet Take one tab 2 x aday as needed  ? oxybutynin (DITROPAN) 5 MG tablet TAKE 1 TABLET( 5 MG TOTAL) BY MOUTH IN THE MORNING AND AT BEDTIME  ? pantoprazole (PROTONIX) 40 MG tablet Take 40 mg by mouth 2 (two) times daily.  ? REXULTI 3 MG TABS Take 1 tablet by mouth daily.  ? rOPINIRole (REQUIP) 0.5 MG tablet Take two tablets po BID for restless legs.  ? sennosides-docusate sodium (SENOKOT-S) 8.6-50 MG tablet Take 2 tablets by mouth daily.  ? SUMAtriptan (IMITREX) 100 MG tablet Take 1 tablet (100 mg total) by mouth every 2 (two) hours as needed (for migraine headaches.). May repeat in 1 hours if headache persists or recurs.  ? traZODone (DESYREL) 150 MG tablet Take 150 mg by mouth at bedtime.  ?  valACYclovir (VALTREX) 500 MG tablet Take 1 tablet (500 mg total) by mouth daily as needed (for fever blisters).  ? [DISCONTINUED] ranitidine (ZANTAC 75) 75 MG tablet Take 1 tablet (75 mg total) by mouth 2 (two) times daily.  ? ?No facility-administered encounter medications on file as of 07/01/2021.  ? ? ?Surgical History: ?Past Surgical History:  ?Procedure Laterality Date  ? ABDOMINAL HYSTERECTOMY    ? AMPUTATION TOE Left 07/31/2016  ? Procedure: AMPUTATION TOE/MPJ 2nd toe;  Surgeon: Sharlotte Alamo, DPM;  Location: [facility] ORS;  Service: Podiatry;  Laterality:  Left;  ? APPENDECTOMY  1990  ? BACK SURGERY    ? low back  ? BREAST SURGERY    ? bilateral breast reduction  ? CARDIAC ELECTROPHYSIOLOGY STUDY AND ABLATION    ? CHOLECYSTECTOMY  1990  ? COLONOSCOPY WITH PROPOFOL N/A 01/04/2017  ? Procedure: COLONOSCOPY WITH PROPOFOL;  Surgeon: Manya Silvas, MD;  Location: [facility] ENDOSCOPY;  Service: Endoscopy;  Laterality: N/A;  ? CORNEAL TRANSPLANT    ? ESOPHAGOGASTRODUODENOSCOPY N/A 04/09/2021  ? Procedure: ESOPHAGOGASTRODUODENOSCOPY (EGD);  Surgeon: Lin Landsman, MD;  Location: [facility] ENDOSCOPY;  Service: Gastroenterology;  Laterality: N/A;  ? ESOPHAGOGASTRODUODENOSCOPY (EGD) WITH PROPOFOL N/A 01/04/2017  ? Procedure: ESOPHAGOGASTRODUODENOSCOPY (EGD) WITH PROPOFOL;  Surgeon: Manya Silvas, MD;  Location: [facility] ENDOSCOPY;  Service: Endoscopy;  Laterality: N/A;  ? EXCISION BONE CYST Left 07/31/2016  ? Procedure: EXCISION BONE CYST/exostectomy 28124/left 2nd;  Surgeon: Sharlotte Alamo, DPM;  Location: [facility] ORS;  Service: Podiatry;  Laterality: Left;  ? EXTRACORPOREAL SHOCK WAVE LITHOTRIPSY Left 09/12/2015  ? Procedure: EXTRACORPOREAL SHOCK WAVE LITHOTRIPSY (ESWL);  Surgeon: Hollice Espy, MD;  Location: [facility] ORS;  Service: Urology;  Laterality: Left;  ? FRACTURE SURGERY    ? left foot  ? HARDWARE REMOVAL Left 11/14/2020  ? Procedure: HARDWARE REMOVAL;  Surgeon: Hessie Knows, MD;  Location: [facility] ORS;  Service: Orthopedics;  Laterality: Left;  ? [place] repair    ? Fundoplication  ? JOINT REPLACEMENT Bilateral 2013,2014  ? total knees  ? LAPAROSCOPIC HYSTERECTOMY    ? LITHOTRIPSY    ? periprosthetic supracondylar fracture of left femur  02/16/2020  ? [facility]  ? TONSILLECTOMY    ? TOTAL KNEE REVISION Right 11/14/2019  ? Procedure: Revision patella and tibial polyethylene;  Surgeon: Hessie Knows, MD;  Location: [facility] ORS;  Service: Orthopedics;  Laterality: Right;  ? URETEROSCOPY    ? ? ?Medical History: ?Past Medical History:  ?Diagnosis Date  ? Acid reflux   ? Anemia   ?  Anxiety   ? Arrhythmia   ? treated with meds and has no current problems  ? Arthritis   ? most uncomfortable in knees  ? Asthma   ? uses several inhalers  ? Depression   ? Fever blister   ? Hematuria   ? History of kidney stones   ? Hypertension   ? Hypoglycemia   ? Left flank pain   ? Migraine   ? Pre-diabetes   ? Restless leg   ? Yeast vaginitis   ? ? ?Family History: ?Family History  ?Problem Relation Age of Onset  ? Hypertension Mother   ? Stroke Mother   ? Prostate cancer Father   ? Kidney Stones Father   ? Diabetes Brother   ? Hypertension Brother   ? Breast cancer Maternal Aunt   ? Kidney disease Neg Hx   ? ? ?Social History  ? ?Socioeconomic History  ? Marital status: Widowed  ?  Spouse name: Not  on file  ? Number of children: Not on file  ? Years of education: Not on file  ? Highest education level: Not on file  ?Occupational History  ? Not on file  ?Tobacco Use  ? Smoking status: Never  ?  Passive exposure: Past  ? Smokeless tobacco: Never  ?Vaping Use  ? Vaping Use: Never used  ?Substance and Sexual Activity  ? Alcohol use: No  ? Drug use: No  ? Sexual activity: Not on file  ?Other Topics Concern  ? Not on file  ?Social History Narrative  ? Not on file  ? ?Social Determinants of Health  ? ?Financial Resource Strain: Not on file  ?Food Insecurity: Not on file  ?Transportation Needs: Not on file  ?Physical Activity: Not on file  ?Stress: Not on file  ?Social Connections: Not on file  ?Intimate Partner Violence: Not on file  ? ? ? ? ?Review of Systems  ?Constitutional:  Negative for chills, fatigue and unexpected weight change.  ?HENT:  Positive for postnasal drip. Negative for congestion, rhinorrhea, sneezing and sore throat.   ?Eyes:  Negative for redness.  ?Respiratory:  Negative for cough, chest tightness and shortness of breath.   ?Cardiovascular:  Negative for chest pain and palpitations.  ?Gastrointestinal:  Negative for abdominal pain, constipation, diarrhea, nausea and vomiting.  ?     Heartburn    ?Genitourinary:  Negative for dysuria and frequency.  ?Musculoskeletal:  Negative for arthralgias, back pain, joint swelling and neck pain.  ?Skin:  Negative for rash.  ?Neurological: Negative.  Negative for t

## 2021-07-02 LAB — CBC WITH DIFFERENTIAL/PLATELET
Basophils Absolute: 0 10*3/uL (ref 0.0–0.2)
Basos: 1 %
EOS (ABSOLUTE): 0.2 10*3/uL (ref 0.0–0.4)
Eos: 4 %
Hematocrit: 41.2 % (ref 34.0–46.6)
Hemoglobin: 14.2 g/dL (ref 11.1–15.9)
Immature Grans (Abs): 0 10*3/uL (ref 0.0–0.1)
Immature Granulocytes: 0 %
Lymphocytes Absolute: 1.6 10*3/uL (ref 0.7–3.1)
Lymphs: 27 %
MCH: 31.3 pg (ref 26.6–33.0)
MCHC: 34.5 g/dL (ref 31.5–35.7)
MCV: 91 fL (ref 79–97)
Monocytes Absolute: 0.3 10*3/uL (ref 0.1–0.9)
Monocytes: 5 %
Neutrophils Absolute: 3.8 10*3/uL (ref 1.4–7.0)
Neutrophils: 63 %
Platelets: 216 10*3/uL (ref 150–450)
RBC: 4.54 x10E6/uL (ref 3.77–5.28)
RDW: 12.4 % (ref 11.7–15.4)
WBC: 6 10*3/uL (ref 3.4–10.8)

## 2021-07-02 LAB — IRON,TIBC AND FERRITIN PANEL
Ferritin: 42 ng/mL (ref 15–150)
Iron Saturation: 23 % (ref 15–55)
Iron: 85 ug/dL (ref 27–139)
Total Iron Binding Capacity: 366 ug/dL (ref 250–450)
UIBC: 281 ug/dL (ref 118–369)

## 2021-07-02 LAB — TSH+FREE T4
Free T4: 1.39 ng/dL (ref 0.82–1.77)
TSH: 1.6 u[IU]/mL (ref 0.450–4.500)

## 2021-07-03 NOTE — Telephone Encounter (Signed)
Change med to pepcid  ?

## 2021-07-16 DIAGNOSIS — Z96652 Presence of left artificial knee joint: Secondary | ICD-10-CM | POA: Diagnosis not present

## 2021-07-16 DIAGNOSIS — Z9889 Other specified postprocedural states: Secondary | ICD-10-CM | POA: Diagnosis not present

## 2021-07-16 DIAGNOSIS — M978XXA Periprosthetic fracture around other internal prosthetic joint, initial encounter: Secondary | ICD-10-CM | POA: Diagnosis not present

## 2021-07-16 DIAGNOSIS — S8012XA Contusion of left lower leg, initial encounter: Secondary | ICD-10-CM | POA: Diagnosis not present

## 2021-07-21 ENCOUNTER — Encounter: Payer: Self-pay | Admitting: Internal Medicine

## 2021-07-21 ENCOUNTER — Other Ambulatory Visit: Payer: Self-pay

## 2021-07-21 DIAGNOSIS — J301 Allergic rhinitis due to pollen: Secondary | ICD-10-CM

## 2021-07-21 MED ORDER — MONTELUKAST SODIUM 10 MG PO TABS: 10.0000 mg | ORAL_TABLET | Freq: Every day | ORAL | 1 refills | Status: DC

## 2021-07-28 ENCOUNTER — Inpatient Hospital Stay: Payer: Medicare Other | Attending: Oncology

## 2021-07-28 ENCOUNTER — Other Ambulatory Visit: Payer: Self-pay

## 2021-07-28 DIAGNOSIS — D500 Iron deficiency anemia secondary to blood loss (chronic): Secondary | ICD-10-CM

## 2021-07-28 DIAGNOSIS — K5909 Other constipation: Secondary | ICD-10-CM | POA: Diagnosis not present

## 2021-07-28 DIAGNOSIS — R5383 Other fatigue: Secondary | ICD-10-CM | POA: Insufficient documentation

## 2021-07-28 DIAGNOSIS — Z8719 Personal history of other diseases of the digestive system: Secondary | ICD-10-CM | POA: Insufficient documentation

## 2021-07-28 DIAGNOSIS — D509 Iron deficiency anemia, unspecified: Secondary | ICD-10-CM | POA: Insufficient documentation

## 2021-07-28 LAB — COMPREHENSIVE METABOLIC PANEL
ALT: 31 U/L (ref 0–44)
AST: 27 U/L (ref 15–41)
Albumin: 3.7 g/dL (ref 3.5–5.0)
Alkaline Phosphatase: 78 U/L (ref 38–126)
Anion gap: 5 (ref 5–15)
BUN: 13 mg/dL (ref 8–23)
CO2: 26 mmol/L (ref 22–32)
Calcium: 8.4 mg/dL — ABNORMAL LOW (ref 8.9–10.3)
Chloride: 102 mmol/L (ref 98–111)
Creatinine, Ser: 0.67 mg/dL (ref 0.44–1.00)
GFR, Estimated: >60 mL/min (ref >60)
Glucose, Bld: 134 mg/dL — ABNORMAL HIGH (ref 70–99)
Potassium: 3.5 mmol/L (ref 3.5–5.1)
Sodium: 133 mmol/L — ABNORMAL LOW (ref 135–145)
Total Bilirubin: 0.4 mg/dL (ref 0.3–1.2)
Total Protein: 6.2 g/dL — ABNORMAL LOW (ref 6.5–8.1)

## 2021-07-28 LAB — CBC WITH DIFFERENTIAL/PLATELET
Abs Immature Granulocytes: 0.01 10*3/uL (ref 0.00–0.07)
Basophils Absolute: 0.1 10*3/uL (ref 0.0–0.1)
Basophils Relative: 1 %
Eosinophils Absolute: 0.2 10*3/uL (ref 0.0–0.5)
Eosinophils Relative: 2 %
HCT: 37.5 % (ref 36.0–46.0)
Hemoglobin: 12.7 g/dL (ref 12.0–15.0)
Immature Granulocytes: 0 %
Lymphocytes Relative: 21 %
Lymphs Abs: 1.7 10*3/uL (ref 0.7–4.0)
MCH: 31 pg (ref 26.0–34.0)
MCHC: 33.9 g/dL (ref 30.0–36.0)
MCV: 91.5 fL (ref 80.0–100.0)
Monocytes Absolute: 0.4 10*3/uL (ref 0.1–1.0)
Monocytes Relative: 5 %
Neutro Abs: 5.5 10*3/uL (ref 1.7–7.7)
Neutrophils Relative %: 71 %
Platelets: 206 10*3/uL (ref 150–400)
RBC: 4.1 MIL/uL (ref 3.87–5.11)
RDW: 12.3 % (ref 11.5–15.5)
WBC: 7.8 10*3/uL (ref 4.0–10.5)
nRBC: 0 % (ref 0.0–0.2)

## 2021-07-28 LAB — IRON AND TIBC
Iron: 71 ug/dL (ref 28–170)
Saturation Ratios: 21 % (ref 10.4–31.8)
TIBC: 344 ug/dL (ref 250–450)
UIBC: 273 ug/dL

## 2021-07-28 LAB — FERRITIN: Ferritin: 28 ng/mL (ref 11–307)

## 2021-07-30 ENCOUNTER — Encounter: Payer: Self-pay | Admitting: Oncology

## 2021-07-30 ENCOUNTER — Inpatient Hospital Stay (HOSPITAL_BASED_OUTPATIENT_CLINIC_OR_DEPARTMENT_OTHER): Payer: Medicare Other | Admitting: Oncology

## 2021-07-30 ENCOUNTER — Inpatient Hospital Stay: Payer: Medicare Other

## 2021-07-30 VITALS — BP 127/74 | HR 94 | Temp 98.5°F | Resp 18 | Wt 167.0 lb

## 2021-07-30 DIAGNOSIS — K5909 Other constipation: Secondary | ICD-10-CM | POA: Diagnosis not present

## 2021-07-30 DIAGNOSIS — D509 Iron deficiency anemia, unspecified: Secondary | ICD-10-CM | POA: Diagnosis not present

## 2021-07-30 DIAGNOSIS — R5383 Other fatigue: Secondary | ICD-10-CM | POA: Diagnosis not present

## 2021-07-30 DIAGNOSIS — D500 Iron deficiency anemia secondary to blood loss (chronic): Secondary | ICD-10-CM

## 2021-07-30 DIAGNOSIS — Z8719 Personal history of other diseases of the digestive system: Secondary | ICD-10-CM | POA: Diagnosis not present

## 2021-07-31 ENCOUNTER — Encounter: Payer: Self-pay | Admitting: Oncology

## 2021-07-31 NOTE — Progress Notes (Signed)
?Hematology/Oncology Progress note ?[phone] [phone] ?  ?Please email events admission ? ? ?Patient Care Team: ?Lavera Guise, MD as PCP - General (Internal Medicine) ?Alena Bills, [facility] as Pharmacist (Pharmacist) ? ?REFERRING PROVIDER: ?Lavera Guise, MD  ?CHIEF COMPLAINTS/REASON FOR VISIT:  ?Follow-up iron deficiency anemia ? ?HISTORY OF PRESENTING ILLNESS:  ? ?Summer Murphy is a  66 y.o.  female with PMH listed below was seen in consultation at the request of  Lavera Guise, MD  for evaluation of iron deficiency anemia. ? ?Reviewed patient's previous labs.  ?Anemia is chronic, date back to August 2021.  ?09/16/20 TIBC 454, ferritin 12, iron saturation 5,  ?10/11/20 Hemoglobin 10.5,  ? ?Patient history of Barrett's esophagus, has had multiple EGDs.  ?Recent EGDs listed below.  ?05/29/2020 upper EGD Barrett's esophagus. Treated with argon plasma  coagulation (APC).  ? - Benign-appearing esophageal stenosis. Dilated.  - LA Grade B reflux esophagitis with no bleeding.  ?- An anti-reflux surgical site was found herniated  above the diaphragm.  ? ?07/24/2020 upper EGD showed Barrett's esophagus. Treated with argon plasma coagulation (APC). Benign-appearing esophageal stenosis. Dilated.      - An anti-reflux surgical site was found.   - Large hiatal hernia.  ? ?10/02/2020 upper EGD showed There is no endoscopic evidence of Barrett's esophagus. Biopsied. Large hiatal hernia, - An anti-reflux surgical site was found. - A large amount of food (residue) in the stomach.  ?Stomach high cardia biopsy showed cardio-oxyntic type mucosa with faveolar hyperplasia.  ?Esophagus biopsy showed squamous esophageal mucosa with chronic esophagitis and increased intraepithelial eosinophils.  ? ?She has had colonoscopy in 2018, procedure was aborted due to poor prep.  ? ?Patient reports fatigue. Denies any black or bloody stool. She has chronic constipation and can not tolerate oral iron supplementation.   ? ?INTERVAL HISTORY ?Summer Murphy is a 66 y.o. female who has above history reviewed by me today presents for follow up visit for management of iron deficiency anemia. ?Patient reports feeling well.  Patient tolerated IV Venofer treatments. ? ?Review of Systems  ?Constitutional:  Positive for fatigue. Negative for appetite change, chills and fever.  ?HENT:   Negative for hearing loss and voice change.   ?Eyes:  Negative for eye problems.  ?Respiratory:  Negative for chest tightness and cough.   ?Cardiovascular:  Negative for chest pain.  ?Gastrointestinal:  Negative for abdominal distention, abdominal pain and blood in stool.  ?Endocrine: Negative for hot flashes.  ?Genitourinary:  Negative for difficulty urinating and frequency.   ?Musculoskeletal:  Negative for arthralgias.  ?Skin:  Negative for itching and rash.  ?Neurological:  Negative for extremity weakness.  ?Hematological:  Negative for adenopathy.  ?Psychiatric/Behavioral:  Negative for confusion.   ? ?MEDICAL HISTORY:  ?Past Medical History:  ?Diagnosis Date  ? Acid reflux   ? Anemia   ? Anxiety   ? Arrhythmia   ? treated with meds and has no current problems  ? Arthritis   ? most uncomfortable in knees  ? Asthma   ? uses several inhalers  ? Depression   ? Fever blister   ? Hematuria   ? History of kidney stones   ? Hypertension   ? Hypoglycemia   ? Left flank pain   ? Migraine   ? Pre-diabetes   ? Restless leg   ? Yeast vaginitis   ? ? ?SURGICAL HISTORY: ?Past Surgical History:  ?Procedure Laterality Date  ? ABDOMINAL HYSTERECTOMY    ? AMPUTATION  TOE Left 07/31/2016  ? Procedure: AMPUTATION TOE/MPJ 2nd toe;  Surgeon: Sharlotte Alamo, DPM;  Location: [facility] ORS;  Service: Podiatry;  Laterality: Left;  ? APPENDECTOMY  1990  ? BACK SURGERY    ? low back  ? BREAST SURGERY    ? bilateral breast reduction  ? CARDIAC ELECTROPHYSIOLOGY STUDY AND ABLATION    ? CHOLECYSTECTOMY  1990  ? COLONOSCOPY WITH PROPOFOL N/A 01/04/2017  ? Procedure: COLONOSCOPY WITH  PROPOFOL;  Surgeon: Manya Silvas, MD;  Location: [facility] ENDOSCOPY;  Service: Endoscopy;  Laterality: N/A;  ? CORNEAL TRANSPLANT    ? ESOPHAGOGASTRODUODENOSCOPY N/A 04/09/2021  ? Procedure: ESOPHAGOGASTRODUODENOSCOPY (EGD);  Surgeon: Lin Landsman, MD;  Location: [facility] ENDOSCOPY;  Service: Gastroenterology;  Laterality: N/A;  ? ESOPHAGOGASTRODUODENOSCOPY (EGD) WITH PROPOFOL N/A 01/04/2017  ? Procedure: ESOPHAGOGASTRODUODENOSCOPY (EGD) WITH PROPOFOL;  Surgeon: Manya Silvas, MD;  Location: [facility] ENDOSCOPY;  Service: Endoscopy;  Laterality: N/A;  ? EXCISION BONE CYST Left 07/31/2016  ? Procedure: EXCISION BONE CYST/exostectomy 28124/left 2nd;  Surgeon: Sharlotte Alamo, DPM;  Location: [facility] ORS;  Service: Podiatry;  Laterality: Left;  ? EXTRACORPOREAL SHOCK WAVE LITHOTRIPSY Left 09/12/2015  ? Procedure: EXTRACORPOREAL SHOCK WAVE LITHOTRIPSY (ESWL);  Surgeon: Hollice Espy, MD;  Location: [facility] ORS;  Service: Urology;  Laterality: Left;  ? FRACTURE SURGERY    ? left foot  ? HARDWARE REMOVAL Left 11/14/2020  ? Procedure: HARDWARE REMOVAL;  Surgeon: Hessie Knows, MD;  Location: [facility] ORS;  Service: Orthopedics;  Laterality: Left;  ? Pena repair    ? Fundoplication  ? JOINT REPLACEMENT Bilateral 2013,2014  ? total knees  ? LAPAROSCOPIC HYSTERECTOMY    ? LITHOTRIPSY    ? periprosthetic supracondylar fracture of left femur  02/16/2020  ? [facility]  ? TONSILLECTOMY    ? TOTAL KNEE REVISION Right 11/14/2019  ? Procedure: Revision patella and tibial polyethylene;  Surgeon: Hessie Knows, MD;  Location: [facility] ORS;  Service: Orthopedics;  Laterality: Right;  ? URETEROSCOPY    ? ? ?SOCIAL HISTORY: ?Social History  ? ?Socioeconomic History  ? Marital status: Widowed  ?  Spouse name: Not on file  ? Number of children: Not on file  ? Years of education: Not on file  ? Highest education level: Not on file  ?Occupational History  ? Not on file  ?Tobacco Use  ? Smoking status: Never  ?  Passive exposure: Past  ? Smokeless  tobacco: Never  ?Vaping Use  ? Vaping Use: Never used  ?Substance and Sexual Activity  ? Alcohol use: No  ? Drug use: No  ? Sexual activity: Not on file  ?Other Topics Concern  ? Not on file  ?Social History Narrative  ? Not on file  ? ?Social Determinants of Health  ? ?Financial Resource Strain: Not on file  ?Food Insecurity: Not on file  ?Transportation Needs: Not on file  ?Physical Activity: Not on file  ?Stress: Not on file  ?Social Connections: Not on file  ?Intimate Partner Violence: Not on file  ? ? ?FAMILY HISTORY: ?Family History  ?Problem Relation Age of Onset  ? Hypertension Mother   ? Stroke Mother   ? Prostate cancer Father   ? Kidney Stones Father   ? Diabetes Brother   ? Hypertension Brother   ? Breast cancer Maternal Aunt   ? Kidney disease Neg Hx   ? ? ?ALLERGIES:  is allergic to librium [chlordiazepoxide], metoclopramide, vanilla, aspirin, nsaids, azithromycin, buprenorphine hcl, chlordiazepoxide hcl, morphine, sulfa antibiotics, tolmetin, amlodipine besylate, and iron. ? ?  MEDICATIONS:  ?Current Outpatient Medications  ?Medication Sig Dispense Refill  ? acetaminophen (TYLENOL) 500 MG tablet Take 2 tablets (1,000 mg total) by mouth every 6 (six) hours. 30 tablet 0  ? busPIRone (BUSPAR) 10 MG tablet Take 10 mg by mouth 2 (two) times daily.    ? clonazePAM (KLONOPIN) 0.5 MG tablet Take 0.5 mg by mouth at bedtime.    ? diclofenac Sodium (VOLTAREN) 1 % GEL Apply 4 g topically 4 (four) times daily. 350 g 1  ? diltiazem (DILACOR XR) 180 MG 24 hr capsule TAKE 1 CAPSULE(180 MG) BY MOUTH DAILY 90 capsule 1  ? diphenhydrAMINE (BENADRYL) 25 MG tablet Take 50 mg by mouth 2 (two) times daily as needed for allergies.    ? diphenoxylate-atropine (LOMOTIL) 2.5-0.025 MG tablet Take 1 tablet by mouth 3 (three) times daily as needed for diarrhea or loose stools. 30 tablet 0  ? DULoxetine (CYMBALTA) 60 MG capsule Take 60 mg by mouth at bedtime.    ? famotidine (PEPCID) 20 MG tablet Take 1 tablet (20 mg total) by mouth  2 (two) times daily. 60 tablet 3  ? fluticasone (FLONASE) 50 MCG/ACT nasal spray Place 2 sprays into both nostrils daily. 16 g 6  ? fluticasone furoate-vilanterol (BREO ELLIPTA) 100-25 MCG/ACT AEPB Inhale 1 puff into the

## 2021-08-20 ENCOUNTER — Ambulatory Visit: Payer: Medicare Other | Admitting: Internal Medicine

## 2021-08-28 ENCOUNTER — Ambulatory Visit (INDEPENDENT_AMBULATORY_CARE_PROVIDER_SITE_OTHER): Payer: Medicare Other | Admitting: Nurse Practitioner

## 2021-08-28 ENCOUNTER — Encounter: Payer: Self-pay | Admitting: Nurse Practitioner

## 2021-08-28 VITALS — BP 138/76 | HR 90 | Temp 98.9°F | Resp 16 | Ht 63.0 in | Wt 165.0 lb

## 2021-08-28 DIAGNOSIS — E538 Deficiency of other specified B group vitamins: Secondary | ICD-10-CM

## 2021-08-28 DIAGNOSIS — R7303 Prediabetes: Secondary | ICD-10-CM

## 2021-08-28 DIAGNOSIS — E559 Vitamin D deficiency, unspecified: Secondary | ICD-10-CM

## 2021-08-28 DIAGNOSIS — E871 Hypo-osmolality and hyponatremia: Secondary | ICD-10-CM | POA: Diagnosis not present

## 2021-08-28 DIAGNOSIS — E1165 Type 2 diabetes mellitus with hyperglycemia: Secondary | ICD-10-CM

## 2021-08-28 DIAGNOSIS — L659 Nonscarring hair loss, unspecified: Secondary | ICD-10-CM | POA: Diagnosis not present

## 2021-08-28 DIAGNOSIS — D508 Other iron deficiency anemias: Secondary | ICD-10-CM | POA: Diagnosis not present

## 2021-08-28 DIAGNOSIS — R5383 Other fatigue: Secondary | ICD-10-CM

## 2021-08-28 LAB — POCT GLYCOSYLATED HEMOGLOBIN (HGB A1C): Hemoglobin A1C: 5.9 % — AB (ref 4.0–5.6)

## 2021-08-28 NOTE — Progress Notes (Signed)
[facility] [place], [place] [address]  Internal MEDICINE  Office Visit Note  Patient Name: Summer Murphy  [id number]  [id number]  Date of Service: 08/28/2021  Chief Complaint  Patient presents with   Acute Visit    Feels so tired like she might pass out, pale, seems groggy, slurring words sometimes because she is so tired, goes to bed around 11pm and is up at 9am and gets up 2 to 3 times at night to go to the bathroom    Fatigue     HPI Walter presents for an acute sick visit for fatigue and near syncope. She reports having faitgue x1 week that has been worse over this week and the weekend. Near fainting, pale, groggy, feels like she is slurring her speech. Feels "out of it" for more than 1 week. She noticed her BP was low when she has been feeling bad, no specific time of day but seems to be more common in the afternoon. --history of prediabetes, last A1C was 5.9 in 2021. Checked A1C today but there was no change, still 5.9 --has history of anemia, B12 deficiency and iron deficiency, will repeat levels. Last BMP showed low sodium and low calcium levels,  Denies chest pain, palpitations, difficulty breathing, fever, chills, or body aches. Patient confirms generalized weakness.  --not sure if she is hydrating and eating well enough --no significant weight loss recently. --denies polydipsia, polyuria and polyphagia.    Current Medication:  Outpatient Encounter Medications as of 08/28/2021  Medication Sig   acetaminophen (TYLENOL) 500 MG tablet Take 2 tablets (1,000 mg total) by mouth every 6 (six) hours.   busPIRone (BUSPAR) 10 MG tablet Take 10 mg by mouth 2 (two) times daily.   clonazePAM (KLONOPIN) 0.5 MG tablet Take 0.5 mg by mouth at bedtime.   diclofenac Sodium (VOLTAREN) 1 % GEL Apply 4 g topically 4 (four) times daily.   diphenhydrAMINE (BENADRYL) 25 MG tablet Take 50 mg by mouth 2 (two) times daily as needed for allergies.    diphenoxylate-atropine (LOMOTIL) 2.5-0.025 MG tablet Take 1 tablet by mouth 3 (three) times daily as needed for diarrhea or loose stools.   DULoxetine (CYMBALTA) 60 MG capsule Take 60 mg by mouth at bedtime.   EPINEPHrine 0.3 mg/0.3 mL IJ SOAJ injection Inject 0.3 mg into the muscle as needed for anaphylaxis.   famotidine (PEPCID) 20 MG tablet Take 1 tablet (20 mg total) by mouth 2 (two) times daily.   fluticasone furoate-vilanterol (BREO ELLIPTA) 100-25 MCG/ACT AEPB Inhale 1 puff into the lungs daily.   gabapentin (NEURONTIN) 600 MG tablet TAKE 1 TABLET BY MOUTH TWICE DAILY   ipratropium-albuterol (DUONEB) 0.5-2.5 (3) MG/3ML SOLN Take 3 mLs by nebulization every 4 (four) hours as needed (for shortness of breath/wheezing).   loratadine (CLARITIN) 10 MG tablet Take 1 tablet (10 mg total) by mouth daily.   montelukast (SINGULAIR) 10 MG tablet Take 1 tablet (10 mg total) by mouth at bedtime.   oxybutynin (DITROPAN) 5 MG tablet TAKE 1 TABLET( 5 MG TOTAL) BY MOUTH IN THE MORNING AND AT BEDTIME   pantoprazole (PROTONIX) 40 MG tablet Take 40 mg by mouth 2 (two) times daily.   REXULTI 3 MG TABS Take 1 tablet by mouth daily.   sennosides-docusate sodium (SENOKOT-S) 8.6-50 MG tablet Take 2 tablets by mouth daily.   traZODone (DESYREL) 150 MG tablet Take 150 mg by mouth at bedtime.   valACYclovir (VALTREX) 500 MG tablet Take 1 tablet (500 mg  total) by mouth daily as needed (for fever blisters).   [DISCONTINUED] diltiazem (DILACOR XR) 180 MG 24 hr capsule TAKE 1 CAPSULE(180 MG) BY MOUTH DAILY   [DISCONTINUED] fluticasone (FLONASE) 50 MCG/ACT nasal spray Place 2 sprays into both nostrils daily.   [DISCONTINUED] Ipratropium-Albuterol (COMBIVENT RESPIMAT) 20-100 MCG/ACT AERS respimat INHALE 1 PUFF BY MOUTH INTO THE LUNGS EVERY 6 HOURS   [DISCONTINUED] metoprolol tartrate (LOPRESSOR) 25 MG tablet Take 1 tablet (25 mg total) by mouth 2 (two) times daily.   [DISCONTINUED] ondansetron (ZOFRAN) 4 MG tablet Take one  tab 2 x aday as needed   [DISCONTINUED] rOPINIRole (REQUIP) 0.5 MG tablet Take two tablets po BID for restless legs.   [DISCONTINUED] SUMAtriptan (IMITREX) 100 MG tablet Take 1 tablet (100 mg total) by mouth every 2 (two) hours as needed (for migraine headaches.). May repeat in 1 hours if headache persists or recurs.   No facility-administered encounter medications on file as of 08/28/2021.      Medical History: Past Medical History:  Diagnosis Date   Acid reflux    Anemia    Anxiety    Arrhythmia    treated with meds and has no current problems   Arthritis    most uncomfortable in knees   Asthma    uses several inhalers   Depression    Fever blister    Hematuria    History of kidney stones    Hypertension    Hypoglycemia    Left flank pain    Migraine    Pre-diabetes    Restless leg    Yeast vaginitis      Vital Signs: BP 138/76   Pulse 90   Temp 98.9 F (37.2 C)   Resp 16   Ht '5\' 3"'$  (1.6 m)   Wt 165 lb (74.8 kg)   SpO2 96%   BMI 29.23 kg/m    Review of Systems  Constitutional:  Positive for fatigue. Negative for appetite change, chills and fever.  Eyes: Negative.   Respiratory: Negative.  Negative for choking, chest tightness, shortness of breath and wheezing.   Cardiovascular: Negative.  Negative for chest pain and palpitations.  Gastrointestinal:  Negative for abdominal pain, constipation, diarrhea, nausea and vomiting.  Endocrine: Negative for polydipsia, polyphagia and polyuria.  Musculoskeletal: Negative.  Negative for myalgias.  Neurological:  Positive for weakness and light-headedness. Negative for headaches.    Physical Exam Vitals reviewed.  Constitutional:      Appearance: Normal appearance. She is normal weight.  HENT:     Head: Normocephalic and atraumatic.  Eyes:     Pupils: Pupils are equal, round, and reactive to light.  Cardiovascular:     Rate and Rhythm: Normal rate and regular rhythm.  Pulmonary:     Effort: Pulmonary effort is  normal. No respiratory distress.  Neurological:     Mental Status: She is alert and oriented to person, place, and time.  Psychiatric:        Mood and Affect: Mood normal.        Behavior: Behavior normal.       Assessment/Plan: 1. Other fatigue Labs ordered for further evaluation.  - Basic Metabolic Panel (BMET) - Y78 and Folate Panel - CBC with Differential/Platelet - TSH + free T4  2. Hair loss disorder Labs ordered for further evaluation - B12 and Folate Panel - CBC with Differential/Platelet - TSH + free T4 - Vitamin D (25 hydroxy)  3. Vitamin D deficiency Labs ordered for further evaluation - B12  and Folate Panel - CBC with Differential/Platelet - Vitamin D (25 hydroxy)  4. Other iron deficiency anemia Labs ordered for further evaluation - B12 and Folate Panel - CBC with Differential/Platelet  5. B12 deficiency Labs ordered for futher evaluation  - B12 and Folate Panel - CBC with Differential/Platelet  6. Prediabetes Labs ordered for further evaluation, A1C is the same with no changes when compared to a1c of 5.9 in 2021.  - Basic Metabolic Panel (BMET) - Q11 and Folate Panel - CBC with Differential/Platelet - TSH + free T4 - POCT glycosylated hemoglobin (Hb A1C)  7. Hyponatremia Labs ordered for further evaluation - Basic Metabolic Panel (BMET) - H41 and Folate Panel - CBC with Differential/Platelet - TSH + free T4   General Counseling: Mystie verbalizes understanding of the findings of todays visit and agrees with plan of treatment. I have discussed any further diagnostic evaluation that may be needed or ordered today. We also reviewed her medications today. she has been encouraged to call the office with any questions or concerns that should arise related to todays visit.    Counseling:    Orders Placed This Encounter  Procedures   Basic Metabolic Panel (BMET)   D40 and Folate Panel   CBC with Differential/Platelet   TSH + free T4    Vitamin D (25 hydroxy)   POCT glycosylated hemoglobin (Hb A1C)    No orders of the defined types were placed in this encounter.   Return for F/U, Review labs/test, Merland Holness PCP.  [place] Controlled Substance Database was reviewed by me for overdose risk score (ORS)  Time spent:30 Minutes Time spent with patient included reviewing progress notes, labs, imaging studies, and discussing plan for follow up.   This patient was seen by Jonetta Osgood, FNP-C in collaboration with Dr. Clayborn Bigness as a part of collaborative care agreement.  Himani Corona R. Valetta Fuller, MSN, FNP-C Internal Medicine

## 2021-08-29 DIAGNOSIS — L659 Nonscarring hair loss, unspecified: Secondary | ICD-10-CM | POA: Diagnosis not present

## 2021-08-29 DIAGNOSIS — E871 Hypo-osmolality and hyponatremia: Secondary | ICD-10-CM | POA: Diagnosis not present

## 2021-08-29 DIAGNOSIS — R5383 Other fatigue: Secondary | ICD-10-CM | POA: Diagnosis not present

## 2021-08-29 DIAGNOSIS — E559 Vitamin D deficiency, unspecified: Secondary | ICD-10-CM | POA: Diagnosis not present

## 2021-08-29 DIAGNOSIS — D508 Other iron deficiency anemias: Secondary | ICD-10-CM | POA: Diagnosis not present

## 2021-09-04 ENCOUNTER — Telehealth: Payer: Self-pay

## 2021-09-05 LAB — CBC WITH DIFFERENTIAL/PLATELET
Basophils Absolute: 0 10*3/uL (ref 0.0–0.2)
Basos: 1 %
EOS (ABSOLUTE): 0.2 10*3/uL (ref 0.0–0.4)
Eos: 3 %
Hematocrit: 39.4 % (ref 34.0–46.6)
Hemoglobin: 13.4 g/dL (ref 11.1–15.9)
Immature Grans (Abs): 0 10*3/uL (ref 0.0–0.1)
Immature Granulocytes: 0 %
Lymphocytes Absolute: 2 10*3/uL (ref 0.7–3.1)
Lymphs: 28 %
MCH: 31.1 pg (ref 26.6–33.0)
MCHC: 34 g/dL (ref 31.5–35.7)
MCV: 91 fL (ref 79–97)
Monocytes Absolute: 0.4 10*3/uL (ref 0.1–0.9)
Monocytes: 5 %
Neutrophils Absolute: 4.5 10*3/uL (ref 1.4–7.0)
Neutrophils: 63 %
Platelets: 186 10*3/uL (ref 150–450)
RBC: 4.31 x10E6/uL (ref 3.77–5.28)
RDW: 12.6 % (ref 11.7–15.4)
WBC: 7.2 10*3/uL (ref 3.4–10.8)

## 2021-09-05 LAB — B12 AND FOLATE PANEL
Folate: >20 ng/mL (ref >3.0)
Vitamin B-12: >2000 pg/mL — ABNORMAL HIGH (ref 232–1245)

## 2021-09-05 LAB — TSH+FREE T4
Free T4: 1.23 ng/dL (ref 0.82–1.77)
TSH: 1.5 u[IU]/mL (ref 0.450–4.500)

## 2021-09-05 LAB — BASIC METABOLIC PANEL
BUN/Creatinine Ratio: 21 (ref 12–28)
BUN: 14 mg/dL (ref 8–27)
CO2: 23 mmol/L (ref 20–29)
Calcium: 9.3 mg/dL (ref 8.7–10.3)
Chloride: 103 mmol/L (ref 96–106)
Creatinine, Ser: 0.68 mg/dL (ref 0.57–1.00)
Glucose: 140 mg/dL — ABNORMAL HIGH (ref 70–99)
Potassium: 4 mmol/L (ref 3.5–5.2)
Sodium: 141 mmol/L (ref 134–144)
eGFR: 96 mL/min/{1.73_m2} (ref >59)

## 2021-09-05 LAB — VITAMIN D 25 HYDROXY (VIT D DEFICIENCY, FRACTURES): Vit D, 25-Hydroxy: 38.1 ng/mL (ref 30.0–100.0)

## 2021-09-07 DIAGNOSIS — R1310 Dysphagia, unspecified: Secondary | ICD-10-CM | POA: Diagnosis not present

## 2021-09-07 DIAGNOSIS — K3184 Gastroparesis: Secondary | ICD-10-CM | POA: Diagnosis not present

## 2021-09-07 NOTE — Progress Notes (Signed)
I have reviewed the lab results. There are no critically abnormal values requiring immediate intervention but there are some abnormals that will be discussed at the next office visit.  

## 2021-09-08 DIAGNOSIS — K3184 Gastroparesis: Secondary | ICD-10-CM | POA: Diagnosis not present

## 2021-09-08 DIAGNOSIS — R1310 Dysphagia, unspecified: Secondary | ICD-10-CM | POA: Diagnosis not present

## 2021-09-08 NOTE — Telephone Encounter (Signed)
Please see result note 

## 2021-09-09 NOTE — Telephone Encounter (Signed)
Spoke to pt and informed her Alyssa reviewed labs and will discuss with pt at next visit on 09-18-21. Pt aware there are some abnormalities but do not require any immediate response at this time

## 2021-09-18 ENCOUNTER — Ambulatory Visit (INDEPENDENT_AMBULATORY_CARE_PROVIDER_SITE_OTHER): Payer: Medicare Other | Admitting: Nurse Practitioner

## 2021-09-18 ENCOUNTER — Encounter: Payer: Self-pay | Admitting: Nurse Practitioner

## 2021-09-18 VITALS — BP 123/71 | HR 95 | Temp 97.6°F | Resp 16 | Ht 63.0 in | Wt 164.2 lb

## 2021-09-18 DIAGNOSIS — Z0001 Encounter for general adult medical examination with abnormal findings: Secondary | ICD-10-CM

## 2021-09-18 DIAGNOSIS — J301 Allergic rhinitis due to pollen: Secondary | ICD-10-CM

## 2021-09-18 DIAGNOSIS — R002 Palpitations: Secondary | ICD-10-CM

## 2021-09-18 DIAGNOSIS — I10 Essential (primary) hypertension: Secondary | ICD-10-CM

## 2021-09-18 DIAGNOSIS — G43919 Migraine, unspecified, intractable, without status migrainosus: Secondary | ICD-10-CM

## 2021-09-18 DIAGNOSIS — R300 Dysuria: Secondary | ICD-10-CM

## 2021-09-18 DIAGNOSIS — G2581 Restless legs syndrome: Secondary | ICD-10-CM

## 2021-09-18 DIAGNOSIS — R112 Nausea with vomiting, unspecified: Secondary | ICD-10-CM

## 2021-09-18 DIAGNOSIS — Z1231 Encounter for screening mammogram for malignant neoplasm of breast: Secondary | ICD-10-CM

## 2021-09-18 DIAGNOSIS — Z760 Encounter for issue of repeat prescription: Secondary | ICD-10-CM

## 2021-09-18 DIAGNOSIS — K449 Diaphragmatic hernia without obstruction or gangrene: Secondary | ICD-10-CM

## 2021-09-18 DIAGNOSIS — J4541 Moderate persistent asthma with (acute) exacerbation: Secondary | ICD-10-CM

## 2021-09-18 MED ORDER — SUMATRIPTAN SUCCINATE 100 MG PO TABS: 100.0000 mg | ORAL_TABLET | ORAL | 3 refills | Status: DC | PRN

## 2021-09-18 MED ORDER — ROPINIROLE HCL 0.5 MG PO TABS: ORAL_TABLET | ORAL | 1 refills | Status: DC

## 2021-09-18 MED ORDER — FLUTICASONE PROPIONATE 50 MCG/ACT NA SUSP: 2.0000 | Freq: Every day | NASAL | 6 refills | Status: DC

## 2021-09-18 MED ORDER — DILTIAZEM HCL ER 180 MG PO CP24: ORAL_CAPSULE | ORAL | 1 refills | Status: DC

## 2021-09-18 MED ORDER — COMBIVENT RESPIMAT 20-100 MCG/ACT IN AERS: INHALATION_SPRAY | RESPIRATORY_TRACT | 3 refills | Status: DC

## 2021-09-18 MED ORDER — ONDANSETRON HCL 4 MG PO TABS: ORAL_TABLET | ORAL | 1 refills | Status: DC

## 2021-09-18 MED ORDER — METOPROLOL TARTRATE 25 MG PO TABS: 25.0000 mg | ORAL_TABLET | Freq: Two times a day (BID) | ORAL | 1 refills | Status: DC

## 2021-09-18 NOTE — Progress Notes (Signed)
[facility] [place], [place] [address]  Internal MEDICINE  Office Visit Note  Patient Name: Summer Murphy  [id number]  [id number]  Date of Service: 09/18/2021  Chief Complaint  Patient presents with   Medicare Wellness   Depression   Gastroesophageal Reflux   Hypertension    HPI Summer Murphy presents for an annual well visit and physical exam. --well appearing 66 yo female with hypertension, migraines, COPD, allergic rhinitis, depression and diabetes.  --due for mammogram in July --due for colonoscopy in 2028 --recently had labs done  --B12, folate, CBC and thyroid are normal.  --metabolic panel normal except for slightly elevated glucose 140 --her vitamin D is low normal at 38.1 Last A1C is unchanged at 5.9 Feeling better since having the episode of fatigue and generalized weakness, earlier this month. Thinks that she may have just been dehydrated or low sugar from not eating.  --no new or worsening pains, no other questions or concerns.       Current Medication: Outpatient Encounter Medications as of 09/18/2021  Medication Sig   acetaminophen (TYLENOL) 500 MG tablet Take 2 tablets (1,000 mg total) by mouth every 6 (six) hours.   busPIRone (BUSPAR) 10 MG tablet Take 10 mg by mouth 2 (two) times daily.   clonazePAM (KLONOPIN) 0.5 MG tablet Take 0.5 mg by mouth at bedtime.   diclofenac Sodium (VOLTAREN) 1 % GEL Apply 4 g topically 4 (four) times daily.   diphenhydrAMINE (BENADRYL) 25 MG tablet Take 50 mg by mouth 2 (two) times daily as needed for allergies.   diphenoxylate-atropine (LOMOTIL) 2.5-0.025 MG tablet Take 1 tablet by mouth 3 (three) times daily as needed for diarrhea or loose stools.   DULoxetine (CYMBALTA) 60 MG capsule Take 60 mg by mouth at bedtime.   EPINEPHrine 0.3 mg/0.3 mL IJ SOAJ injection Inject 0.3 mg into the muscle as needed for anaphylaxis.   famotidine (PEPCID) 20 MG tablet Take 1 tablet (20 mg total) by mouth 2 (two) times  daily.   fluticasone furoate-vilanterol (BREO ELLIPTA) 100-25 MCG/ACT AEPB Inhale 1 puff into the lungs daily.   gabapentin (NEURONTIN) 600 MG tablet TAKE 1 TABLET BY MOUTH TWICE DAILY   ipratropium-albuterol (DUONEB) 0.5-2.5 (3) MG/3ML SOLN Take 3 mLs by nebulization every 4 (four) hours as needed (for shortness of breath/wheezing).   loratadine (CLARITIN) 10 MG tablet Take 1 tablet (10 mg total) by mouth daily.   montelukast (SINGULAIR) 10 MG tablet Take 1 tablet (10 mg total) by mouth at bedtime.   oxybutynin (DITROPAN) 5 MG tablet TAKE 1 TABLET( 5 MG TOTAL) BY MOUTH IN THE MORNING AND AT BEDTIME   pantoprazole (PROTONIX) 40 MG tablet Take 40 mg by mouth 2 (two) times daily.   REXULTI 3 MG TABS Take 1 tablet by mouth daily.   sennosides-docusate sodium (SENOKOT-S) 8.6-50 MG tablet Take 2 tablets by mouth daily.   traZODone (DESYREL) 150 MG tablet Take 150 mg by mouth at bedtime.   valACYclovir (VALTREX) 500 MG tablet Take 1 tablet (500 mg total) by mouth daily as needed (for fever blisters).   [DISCONTINUED] diltiazem (DILACOR XR) 180 MG 24 hr capsule TAKE 1 CAPSULE(180 MG) BY MOUTH DAILY   [DISCONTINUED] fluticasone (FLONASE) 50 MCG/ACT nasal spray Place 2 sprays into both nostrils daily.   [DISCONTINUED] Ipratropium-Albuterol (COMBIVENT RESPIMAT) 20-100 MCG/ACT AERS respimat INHALE 1 PUFF BY MOUTH INTO THE LUNGS EVERY 6 HOURS   [DISCONTINUED] metoprolol tartrate (LOPRESSOR) 25 MG tablet Take 1 tablet (25 mg total) by  mouth 2 (two) times daily.   [DISCONTINUED] ondansetron (ZOFRAN) 4 MG tablet Take one tab 2 x aday as needed   [DISCONTINUED] rOPINIRole (REQUIP) 0.5 MG tablet Take two tablets po BID for restless legs.   [DISCONTINUED] SUMAtriptan (IMITREX) 100 MG tablet Take 1 tablet (100 mg total) by mouth every 2 (two) hours as needed (for migraine headaches.). May repeat in 1 hours if headache persists or recurs.   diltiazem (DILACOR XR) 180 MG 24 hr capsule TAKE 1 CAPSULE(180 MG) BY MOUTH  DAILY   fluticasone (FLONASE) 50 MCG/ACT nasal spray Place 2 sprays into both nostrils daily.   Ipratropium-Albuterol (COMBIVENT RESPIMAT) 20-100 MCG/ACT AERS respimat INHALE 1 PUFF BY MOUTH INTO THE LUNGS EVERY 6 HOURS   metoprolol tartrate (LOPRESSOR) 25 MG tablet Take 1 tablet (25 mg total) by mouth 2 (two) times daily.   ondansetron (ZOFRAN) 4 MG tablet Take one tab 2 x aday as needed   rOPINIRole (REQUIP) 0.5 MG tablet Take two tablets po BID for restless legs.   SUMAtriptan (IMITREX) 100 MG tablet Take 1 tablet (100 mg total) by mouth every 2 (two) hours as needed (for migraine headaches.). May repeat in 1 hours if headache persists or recurs.   No facility-administered encounter medications on file as of 09/18/2021.    Surgical History: Past Surgical History:  Procedure Laterality Date   ABDOMINAL HYSTERECTOMY     AMPUTATION TOE Left 07/31/2016   Procedure: AMPUTATION TOE/MPJ 2nd toe;  Surgeon: Sharlotte Alamo, DPM;  Location: [facility] ORS;  Service: Podiatry;  Laterality: Left;   APPENDECTOMY  1990   BACK SURGERY     low back   BREAST SURGERY     bilateral breast reduction   CARDIAC ELECTROPHYSIOLOGY STUDY AND ABLATION     CHOLECYSTECTOMY  1990   COLONOSCOPY WITH PROPOFOL N/A 01/04/2017   Procedure: COLONOSCOPY WITH PROPOFOL;  Surgeon: Manya Silvas, MD;  Location: [facility] ENDOSCOPY;  Service: Endoscopy;  Laterality: N/A;   CORNEAL TRANSPLANT     ESOPHAGOGASTRODUODENOSCOPY N/A 04/09/2021   Procedure: ESOPHAGOGASTRODUODENOSCOPY (EGD);  Surgeon: Lin Landsman, MD;  Location: [facility] ENDOSCOPY;  Service: Gastroenterology;  Laterality: N/A;   ESOPHAGOGASTRODUODENOSCOPY (EGD) WITH PROPOFOL N/A 01/04/2017   Procedure: ESOPHAGOGASTRODUODENOSCOPY (EGD) WITH PROPOFOL;  Surgeon: Manya Silvas, MD;  Location: [facility] ENDOSCOPY;  Service: Endoscopy;  Laterality: N/A;   EXCISION BONE CYST Left 07/31/2016   Procedure: EXCISION BONE CYST/exostectomy 28124/left 2nd;  Surgeon: Sharlotte Alamo, DPM;   Location: [facility] ORS;  Service: Podiatry;  Laterality: Left;   EXTRACORPOREAL SHOCK WAVE LITHOTRIPSY Left 09/12/2015   Procedure: EXTRACORPOREAL SHOCK WAVE LITHOTRIPSY (ESWL);  Surgeon: Hollice Espy, MD;  Location: [facility] ORS;  Service: Urology;  Laterality: Left;   FRACTURE SURGERY     left foot   HARDWARE REMOVAL Left 11/14/2020   Procedure: HARDWARE REMOVAL;  Surgeon: Hessie Knows, MD;  Location: [facility] ORS;  Service: Orthopedics;  Laterality: Left;   Cloverleaf repair     Fundoplication   JOINT REPLACEMENT Bilateral 2013,2014   total knees   LAPAROSCOPIC HYSTERECTOMY     LITHOTRIPSY     periprosthetic supracondylar fracture of left femur  02/16/2020   [facility]   TONSILLECTOMY     TOTAL KNEE REVISION Right 11/14/2019   Procedure: Revision patella and tibial polyethylene;  Surgeon: Hessie Knows, MD;  Location: [facility] ORS;  Service: Orthopedics;  Laterality: Right;   URETEROSCOPY      Medical History: Past Medical History:  Diagnosis Date   Acid reflux    Anemia  Anxiety    Arrhythmia    treated with meds and has no current problems   Arthritis    most uncomfortable in knees   Asthma    uses several inhalers   Depression    Fever blister    Hematuria    History of kidney stones    Hypertension    Hypoglycemia    Left flank pain    Migraine    Pre-diabetes    Restless leg    Yeast vaginitis     Family History: Family History  Problem Relation Age of Onset   Hypertension Mother    Stroke Mother    Prostate cancer Father    Kidney Stones Father    Diabetes Brother    Hypertension Brother    Breast cancer Maternal Aunt    Kidney disease Neg Hx     Social History   Socioeconomic History   Marital status: Widowed    Spouse name: Not on file   Number of children: Not on file   Years of education: Not on file   Highest education level: Not on file  Occupational History   Not on file  Tobacco Use   Smoking status: Never    Passive exposure: Past   Smokeless  tobacco: Never  Vaping Use   Vaping Use: Never used  Substance and Sexual Activity   Alcohol use: No   Drug use: No   Sexual activity: Not on file  Other Topics Concern   Not on file  Social History Narrative   Not on file   Social Determinants of Health   Financial Resource Strain: Not on file  Food Insecurity: Not on file  Transportation Needs: Not on file  Physical Activity: Not on file  Stress: Not on file  Social Connections: Not on file  Intimate Partner Violence: Not on file      Review of Systems  Constitutional:  Negative for activity change, appetite change, chills, fatigue, fever and unexpected weight change.  HENT: Negative.  Negative for congestion, ear pain, rhinorrhea, sore throat and trouble swallowing.   Eyes: Negative.   Respiratory: Negative.  Negative for cough, chest tightness, shortness of breath and wheezing.   Cardiovascular: Negative.  Negative for chest pain.  Gastrointestinal: Negative.  Negative for abdominal pain, blood in stool, constipation, diarrhea, nausea and vomiting.  Endocrine: Negative.   Genitourinary: Negative.  Negative for difficulty urinating, dysuria, frequency, hematuria and urgency.  Musculoskeletal: Negative.  Negative for arthralgias, back pain, joint swelling, myalgias and neck pain.  Skin: Negative.  Negative for rash and wound.  Allergic/Immunologic: Negative.  Negative for immunocompromised state.  Neurological: Negative.  Negative for dizziness, seizures, numbness and headaches.  Hematological: Negative.   Psychiatric/Behavioral: Negative.  Negative for behavioral problems, self-injury and suicidal ideas. The patient is not nervous/anxious.     Vital Signs: BP 123/71   Pulse 95   Temp 97.6 F (36.4 C)   Resp 16   Ht '5\' 3"'$  (1.6 m)   Wt 164 lb 3.2 oz (74.5 kg)   SpO2 99%   BMI 29.09 kg/m    Physical Exam Vitals reviewed.  Constitutional:      General: She is awake. She is not in acute distress.     Appearance: Normal appearance. She is well-developed, well-groomed and overweight. She is not ill-appearing or diaphoretic.  HENT:     Head: Normocephalic and atraumatic.     Right Ear: Tympanic membrane, ear canal and external ear normal.  Left Ear: Tympanic membrane, ear canal and external ear normal.     Nose: Nose normal. No congestion or rhinorrhea.     Mouth/Throat:     Lips: Pink.     Mouth: Mucous membranes are moist.     Pharynx: Oropharynx is clear. Uvula midline. No oropharyngeal exudate or posterior oropharyngeal erythema.  Eyes:     General: Lids are normal. Vision grossly intact. Gaze aligned appropriately. No scleral icterus.       Right eye: No discharge.        Left eye: No discharge.     Extraocular Movements: Extraocular movements intact.     Conjunctiva/sclera: Conjunctivae normal.     Pupils: Pupils are equal, round, and reactive to light.     Funduscopic exam:    Right eye: Red reflex present.        Left eye: Red reflex present. Neck:     Thyroid: No thyromegaly.     Vascular: No carotid bruit or JVD.     Trachea: Trachea and phonation normal. No tracheal deviation.  Cardiovascular:     Rate and Rhythm: Normal rate and regular rhythm.     Pulses: Normal pulses.     Heart sounds: Normal heart sounds, S1 normal and S2 normal. No murmur heard.    No friction rub. No gallop.  Pulmonary:     Effort: Pulmonary effort is normal. No accessory muscle usage or respiratory distress.     Breath sounds: Normal breath sounds and air entry. No stridor. No wheezing or rales.  Chest:     Chest wall: No tenderness.  Breasts:    Breasts are symmetrical.     Right: Normal.     Left: Normal.  Abdominal:     General: Bowel sounds are normal. There is no distension.     Palpations: Abdomen is soft. There is no shifting dullness, fluid wave, mass or pulsatile mass.     Tenderness: There is no abdominal tenderness. There is no guarding or rebound.  Musculoskeletal:         General: No tenderness or deformity. Normal range of motion.     Cervical back: Normal range of motion and neck supple.     Right lower leg: 1+ Edema present.     Left lower leg: 1+ Edema present.  Lymphadenopathy:     Cervical: No cervical adenopathy.     Upper Body:     Right upper body: No supraclavicular, axillary or pectoral adenopathy.     Left upper body: No supraclavicular, axillary or pectoral adenopathy.  Skin:    General: Skin is warm and dry.     Capillary Refill: Capillary refill takes less than 2 seconds.     Coloration: Skin is not pale.     Findings: No erythema or rash.  Neurological:     Mental Status: She is alert and oriented to person, place, and time.     Cranial Nerves: No cranial nerve deficit.     Motor: No abnormal muscle tone.     Coordination: Coordination normal.     Deep Tendon Reflexes: Reflexes are normal and symmetric.  Psychiatric:        Mood and Affect: Mood normal.        Behavior: Behavior normal. Behavior is cooperative.        Thought Content: Thought content normal.        Judgment: Judgment normal.        Assessment/Plan: 1. Encounter for general adult medical  examination with abnormal findings Age-appropriate preventive screenings and vaccinations discussed, annual physical exam completed. Routine labs for health maintenance drawn prior to office visit, results discussed with patient today. PHM updated. Due for mammogram. No significant abnormal labs.   2. Essential hypertension Well controlled, no issues - diltiazem (DILACOR XR) 180 MG 24 hr capsule; TAKE 1 CAPSULE(180 MG) BY MOUTH DAILY  Dispense: 90 capsule; Refill: 1 - metoprolol tartrate (LOPRESSOR) 25 MG tablet; Take 1 tablet (25 mg total) by mouth 2 (two) times daily.  Dispense: 180 tablet; Refill: 1  3. Moderate persistent asthma with acute exacerbation Stable, no issues  4. Dysuria - UA/M w/rflx Culture, Routine - Microscopic Examination - Urine Culture, Reflex  5.  Encounter for screening mammogram for malignant neoplasm of breast - MM 3D SCREEN BREAST BILATERAL; Future  6. Medication refill - diltiazem (DILACOR XR) 180 MG 24 hr capsule; TAKE 1 CAPSULE(180 MG) BY MOUTH DAILY  Dispense: 90 capsule; Refill: 1 - Ipratropium-Albuterol (COMBIVENT RESPIMAT) 20-100 MCG/ACT AERS respimat; INHALE 1 PUFF BY MOUTH INTO THE LUNGS EVERY 6 HOURS  Dispense: 4 g; Refill: 3 - fluticasone (FLONASE) 50 MCG/ACT nasal spray; Place 2 sprays into both nostrils daily.  Dispense: 16 g; Refill: 6 - metoprolol tartrate (LOPRESSOR) 25 MG tablet; Take 1 tablet (25 mg total) by mouth 2 (two) times daily.  Dispense: 180 tablet; Refill: 1 - rOPINIRole (REQUIP) 0.5 MG tablet; Take two tablets po BID for restless legs.  Dispense: 360 tablet; Refill: 1 - SUMAtriptan (IMITREX) 100 MG tablet; Take 1 tablet (100 mg total) by mouth every 2 (two) hours as needed (for migraine headaches.). May repeat in 1 hours if headache persists or recurs.  Dispense: 10 tablet; Refill: 3      General Counseling: Addisson verbalizes understanding of the findings of todays visit and agrees with plan of treatment. I have discussed any further diagnostic evaluation that may be needed or ordered today. We also reviewed her medications today. she has been encouraged to call the office with any questions or concerns that should arise related to todays visit.    Orders Placed This Encounter  Procedures   MM 3D SCREEN BREAST BILATERAL   UA/M w/rflx Culture, Routine    Meds ordered this encounter  Medications   diltiazem (DILACOR XR) 180 MG 24 hr capsule    Sig: TAKE 1 CAPSULE(180 MG) BY MOUTH DAILY    Dispense:  90 capsule    Refill:  1   Ipratropium-Albuterol (COMBIVENT RESPIMAT) 20-100 MCG/ACT AERS respimat    Sig: INHALE 1 PUFF BY MOUTH INTO THE LUNGS EVERY 6 HOURS    Dispense:  4 g    Refill:  3   fluticasone (FLONASE) 50 MCG/ACT nasal spray    Sig: Place 2 sprays into both nostrils daily.     Dispense:  16 g    Refill:  6   metoprolol tartrate (LOPRESSOR) 25 MG tablet    Sig: Take 1 tablet (25 mg total) by mouth 2 (two) times daily.    Dispense:  180 tablet    Refill:  1   ondansetron (ZOFRAN) 4 MG tablet    Sig: Take one tab 2 x aday as needed    Dispense:  45 tablet    Refill:  1   rOPINIRole (REQUIP) 0.5 MG tablet    Sig: Take two tablets po BID for restless legs.    Dispense:  360 tablet    Refill:  1    Just updating medication.  SUMAtriptan (IMITREX) 100 MG tablet    Sig: Take 1 tablet (100 mg total) by mouth every 2 (two) hours as needed (for migraine headaches.). May repeat in 1 hours if headache persists or recurs.    Dispense:  10 tablet    Refill:  3    Return for upcoming appt in july and august.   Total time spent:30 Minutes Time spent includes review of chart, medications, test results, and follow up plan with the patient.   [place] Controlled Substance Database was reviewed by me.  This patient was seen by Jonetta Osgood, FNP-C in collaboration with Dr. Clayborn Bigness as a part of collaborative care agreement.  Brizeida Mcmurry R. Valetta Fuller, MSN, FNP-C Internal medicine

## 2021-09-18 NOTE — Patient Instructions (Addendum)
Vitamin D level is low normal. And you also have osteopenia. Taking a good combined supplement with calcium and vitamin D will help prevent progression to osteoporosis. Please ask your pharmacist which supplement they recommend that is affordable and contains vitamin D and calcium.

## 2021-09-23 LAB — UA/M W/RFLX CULTURE, ROUTINE
Bilirubin, UA: NEGATIVE
Glucose, UA: NEGATIVE
Ketones, UA: NEGATIVE
Nitrite, UA: NEGATIVE
Protein,UA: NEGATIVE
RBC, UA: NEGATIVE
Specific Gravity, UA: 1.025 (ref 1.005–1.030)
Urobilinogen, Ur: 0.2 mg/dL (ref 0.2–1.0)
pH, UA: 5 (ref 5.0–7.5)

## 2021-09-23 LAB — MICROSCOPIC EXAMINATION
Bacteria, UA: NONE SEEN
Casts: NONE SEEN /lpf

## 2021-09-23 LAB — URINE CULTURE, REFLEX

## 2021-10-01 ENCOUNTER — Ambulatory Visit (INDEPENDENT_AMBULATORY_CARE_PROVIDER_SITE_OTHER): Payer: Medicare Other | Admitting: Internal Medicine

## 2021-10-01 DIAGNOSIS — R0602 Shortness of breath: Secondary | ICD-10-CM

## 2021-10-10 DIAGNOSIS — Z1231 Encounter for screening mammogram for malignant neoplasm of breast: Secondary | ICD-10-CM | POA: Diagnosis not present

## 2021-10-11 ENCOUNTER — Encounter: Payer: Self-pay | Admitting: Nurse Practitioner

## 2021-10-20 DIAGNOSIS — K449 Diaphragmatic hernia without obstruction or gangrene: Secondary | ICD-10-CM | POA: Diagnosis not present

## 2021-10-22 ENCOUNTER — Other Ambulatory Visit: Payer: Self-pay | Admitting: Nurse Practitioner

## 2021-10-22 DIAGNOSIS — G63 Polyneuropathy in diseases classified elsewhere: Secondary | ICD-10-CM

## 2021-10-28 ENCOUNTER — Encounter: Payer: Self-pay | Admitting: Nurse Practitioner

## 2021-10-28 ENCOUNTER — Ambulatory Visit (INDEPENDENT_AMBULATORY_CARE_PROVIDER_SITE_OTHER): Payer: Medicare Other | Admitting: Nurse Practitioner

## 2021-10-28 VITALS — BP 115/82 | HR 100 | Temp 97.8°F | Resp 16 | Ht 63.0 in | Wt 166.0 lb

## 2021-10-28 DIAGNOSIS — T781XXA Other adverse food reactions, not elsewhere classified, initial encounter: Secondary | ICD-10-CM

## 2021-10-28 DIAGNOSIS — L298 Other pruritus: Secondary | ICD-10-CM

## 2021-10-28 DIAGNOSIS — J4540 Moderate persistent asthma, uncomplicated: Secondary | ICD-10-CM

## 2021-10-28 MED ORDER — METHYLPREDNISOLONE ACETATE 80 MG/ML IJ SUSP
80.0000 mg | Freq: Once | INTRAMUSCULAR | Status: AC
  Administered 2021-10-28: 60 mg via INTRAMUSCULAR

## 2021-10-28 MED ORDER — TRIAMCINOLONE ACETONIDE 0.1 % EX CREA: 1.0000 | TOPICAL_CREAM | Freq: Two times a day (BID) | CUTANEOUS | 1 refills | Status: DC

## 2021-10-28 MED ORDER — HYDROXYZINE HCL 25 MG PO TABS: 25.0000 mg | ORAL_TABLET | Freq: Four times a day (QID) | ORAL | 0 refills | Status: DC | PRN

## 2021-10-28 NOTE — Progress Notes (Signed)
[facility] [place], [place] [address]  Internal MEDICINE  Office Visit Note  Patient Name: Summer Murphy  [id number]  [id number]  Date of Service: 10/28/2021  Chief Complaint  Patient presents with   Follow-up    PFT   Rash    Allergic reaction itching     HPI Summer Murphy presents for a follow-up visit for asthma and PFT results. --Her pulmonary function test has improved when compared to previous PFTs.  She has not noticed any significant change in her respiratory status.  She denies any shortness of breath, chest tightness, cough, or wheezing. --She is allergic to vanilla extract, she recently was eating some cake that a friend gave her and she found out after eating some of it that it had been no extract in it.  She developed a pruritic maculopapular rash on her extremities and torso.  She took Benadryl but this did not help.  She does feel like her throat is thick and her tongue is swollen and like her mouth is "broke out "on the inside.     Current Medication: Outpatient Encounter Medications as of 10/28/2021  Medication Sig   acetaminophen (TYLENOL) 500 MG tablet Take 2 tablets (1,000 mg total) by mouth every 6 (six) hours.   busPIRone (BUSPAR) 10 MG tablet Take 10 mg by mouth 2 (two) times daily.   clonazePAM (KLONOPIN) 0.5 MG tablet Take 0.5 mg by mouth at bedtime.   diclofenac Sodium (VOLTAREN) 1 % GEL Apply 4 g topically 4 (four) times daily.   diltiazem (DILACOR XR) 180 MG 24 hr capsule TAKE 1 CAPSULE(180 MG) BY MOUTH DAILY   diphenhydrAMINE (BENADRYL) 25 MG tablet Take 50 mg by mouth 2 (two) times daily as needed for allergies.   diphenoxylate-atropine (LOMOTIL) 2.5-0.025 MG tablet Take 1 tablet by mouth 3 (three) times daily as needed for diarrhea or loose stools.   DULoxetine (CYMBALTA) 60 MG capsule Take 60 mg by mouth at bedtime.   EPINEPHrine 0.3 mg/0.3 mL IJ SOAJ injection Inject 0.3 mg into the muscle as needed for anaphylaxis.    famotidine (PEPCID) 20 MG tablet Take 1 tablet (20 mg total) by mouth 2 (two) times daily.   fluticasone (FLONASE) 50 MCG/ACT nasal spray Place 2 sprays into both nostrils daily.   fluticasone furoate-vilanterol (BREO ELLIPTA) 100-25 MCG/ACT AEPB Inhale 1 puff into the lungs daily.   gabapentin (NEURONTIN) 600 MG tablet TAKE 1 TABLET BY MOUTH TWICE DAILY   hydrOXYzine (ATARAX) 25 MG tablet Take 1-2 tablets (25-50 mg total) by mouth every 6 (six) hours as needed for itching.   Ipratropium-Albuterol (COMBIVENT RESPIMAT) 20-100 MCG/ACT AERS respimat INHALE 1 PUFF BY MOUTH INTO THE LUNGS EVERY 6 HOURS   ipratropium-albuterol (DUONEB) 0.5-2.5 (3) MG/3ML SOLN Take 3 mLs by nebulization every 4 (four) hours as needed (for shortness of breath/wheezing).   loratadine (CLARITIN) 10 MG tablet Take 1 tablet (10 mg total) by mouth daily.   metoprolol tartrate (LOPRESSOR) 25 MG tablet Take 1 tablet (25 mg total) by mouth 2 (two) times daily.   montelukast (SINGULAIR) 10 MG tablet Take 1 tablet (10 mg total) by mouth at bedtime.   ondansetron (ZOFRAN) 4 MG tablet Take one tab 2 x aday as needed   oxybutynin (DITROPAN) 5 MG tablet TAKE 1 TABLET( 5 MG TOTAL) BY MOUTH IN THE MORNING AND AT BEDTIME   pantoprazole (PROTONIX) 40 MG tablet Take 40 mg by mouth 2 (two) times daily.   REXULTI 3 MG TABS  Take 1 tablet by mouth daily.   rOPINIRole (REQUIP) 0.5 MG tablet Take two tablets po BID for restless legs.   sennosides-docusate sodium (SENOKOT-S) 8.6-50 MG tablet Take 2 tablets by mouth daily.   SUMAtriptan (IMITREX) 100 MG tablet Take 1 tablet (100 mg total) by mouth every 2 (two) hours as needed (for migraine headaches.). May repeat in 1 hours if headache persists or recurs.   traZODone (DESYREL) 150 MG tablet Take 150 mg by mouth at bedtime.   triamcinolone cream (KENALOG) 0.1 % Apply 1 Application topically 2 (two) times daily. To affected area for itching or until rash resolves.   valACYclovir (VALTREX) 500 MG  tablet Take 1 tablet (500 mg total) by mouth daily as needed (for fever blisters).   [EXPIRED] methylPREDNISolone acetate (DEPO-MEDROL) injection 80 mg    No facility-administered encounter medications on file as of 10/28/2021.    Surgical History: Past Surgical History:  Procedure Laterality Date   ABDOMINAL HYSTERECTOMY     AMPUTATION TOE Left 07/31/2016   Procedure: AMPUTATION TOE/MPJ 2nd toe;  Surgeon: Sharlotte Alamo, DPM;  Location: [facility] ORS;  Service: Podiatry;  Laterality: Left;   APPENDECTOMY  1990   BACK SURGERY     low back   BREAST SURGERY     bilateral breast reduction   CARDIAC ELECTROPHYSIOLOGY STUDY AND ABLATION     CHOLECYSTECTOMY  1990   COLONOSCOPY WITH PROPOFOL N/A 01/04/2017   Procedure: COLONOSCOPY WITH PROPOFOL;  Surgeon: Manya Silvas, MD;  Location: [facility] ENDOSCOPY;  Service: Endoscopy;  Laterality: N/A;   CORNEAL TRANSPLANT     ESOPHAGOGASTRODUODENOSCOPY N/A 04/09/2021   Procedure: ESOPHAGOGASTRODUODENOSCOPY (EGD);  Surgeon: Lin Landsman, MD;  Location: [facility] ENDOSCOPY;  Service: Gastroenterology;  Laterality: N/A;   ESOPHAGOGASTRODUODENOSCOPY (EGD) WITH PROPOFOL N/A 01/04/2017   Procedure: ESOPHAGOGASTRODUODENOSCOPY (EGD) WITH PROPOFOL;  Surgeon: Manya Silvas, MD;  Location: [facility] ENDOSCOPY;  Service: Endoscopy;  Laterality: N/A;   EXCISION BONE CYST Left 07/31/2016   Procedure: EXCISION BONE CYST/exostectomy 28124/left 2nd;  Surgeon: Sharlotte Alamo, DPM;  Location: [facility] ORS;  Service: Podiatry;  Laterality: Left;   EXTRACORPOREAL SHOCK WAVE LITHOTRIPSY Left 09/12/2015   Procedure: EXTRACORPOREAL SHOCK WAVE LITHOTRIPSY (ESWL);  Surgeon: Hollice Espy, MD;  Location: [facility] ORS;  Service: Urology;  Laterality: Left;   FRACTURE SURGERY     left foot   HARDWARE REMOVAL Left 11/14/2020   Procedure: HARDWARE REMOVAL;  Surgeon: Hessie Knows, MD;  Location: [facility] ORS;  Service: Orthopedics;  Laterality: Left;   [place] repair     Fundoplication   JOINT  REPLACEMENT Bilateral 2013,2014   total knees   LAPAROSCOPIC HYSTERECTOMY     LITHOTRIPSY     periprosthetic supracondylar fracture of left femur  02/16/2020   [facility]   TONSILLECTOMY     TOTAL KNEE REVISION Right 11/14/2019   Procedure: Revision patella and tibial polyethylene;  Surgeon: Hessie Knows, MD;  Location: [facility] ORS;  Service: Orthopedics;  Laterality: Right;   URETEROSCOPY      Medical History: Past Medical History:  Diagnosis Date   Acid reflux    Anemia    Anxiety    Arrhythmia    treated with meds and has no current problems   Arthritis    most uncomfortable in knees   Asthma    uses several inhalers   Depression    Fever blister    Hematuria    History of kidney stones    Hypertension    Hypoglycemia    Left flank pain  Migraine    Pre-diabetes    Restless leg    Yeast vaginitis     Family History: Family History  Problem Relation Age of Onset   Hypertension Mother    Stroke Mother    Prostate cancer Father    Kidney Stones Father    Diabetes Brother    Hypertension Brother    Breast cancer Maternal Aunt    Kidney disease Neg Hx     Social History   Socioeconomic History   Marital status: Widowed    Spouse name: Not on file   Number of children: Not on file   Years of education: Not on file   Highest education level: Not on file  Occupational History   Not on file  Tobacco Use   Smoking status: Never    Passive exposure: Past   Smokeless tobacco: Never  Vaping Use   Vaping Use: Never used  Substance and Sexual Activity   Alcohol use: No   Drug use: No   Sexual activity: Not on file  Other Topics Concern   Not on file  Social History Narrative   Not on file   Social Determinants of Health   Financial Resource Strain: Not on file  Food Insecurity: Not on file  Transportation Needs: Not on file  Physical Activity: Not on file  Stress: Not on file  Social Connections: Not on file  Intimate Partner Violence: Not on  file      Review of Systems  Constitutional:  Negative for chills, fatigue and unexpected weight change.  HENT:  Negative for congestion, rhinorrhea, sneezing and sore throat.   Eyes:  Negative for redness.  Respiratory:  Positive for shortness of breath. Negative for cough, chest tightness and wheezing.        Has SOB, neck feels slightly tight, tongue feels thick  Cardiovascular:  Negative for chest pain and palpitations.  Gastrointestinal:  Negative for abdominal pain, constipation, diarrhea, nausea and vomiting.  Genitourinary:  Negative for dysuria and frequency.  Musculoskeletal:  Negative for arthralgias, back pain, joint swelling and neck pain.  Skin:  Positive for rash.  Neurological: Negative.  Negative for tremors and numbness.  Hematological:  Negative for adenopathy. Does not bruise/bleed easily.  Psychiatric/Behavioral:  Negative for behavioral problems (Depression), sleep disturbance and suicidal ideas. The patient is not nervous/anxious.     Vital Signs: BP 115/82   Pulse 100   Temp 97.8 F (36.6 C)   Resp 16   Ht '5\' 3"'$  (1.6 m)   Wt 166 lb (75.3 kg)   SpO2 95%   BMI 29.41 kg/m    Physical Exam Vitals reviewed.  Constitutional:      General: She is not in acute distress.    Appearance: Normal appearance. She is normal weight. She is not ill-appearing.  HENT:     Head: Normocephalic and atraumatic.     Mouth/Throat:     Comments: Tongue is slightly swollen Eyes:     Pupils: Pupils are equal, round, and reactive to light.  Cardiovascular:     Rate and Rhythm: Normal rate and regular rhythm.     Heart sounds: Normal heart sounds. No murmur heard. Pulmonary:     Effort: Pulmonary effort is normal. No respiratory distress.     Breath sounds: Normal breath sounds. No wheezing.  Neurological:     Mental Status: She is alert and oriented to person, place, and time.  Psychiatric:        Mood and Affect:  Mood normal.        Behavior: Behavior normal.         Assessment/Plan: 1. Moderate persistent asthma without complication Stable, no issues, PFT shows some improvement, does not need any refills right now.   2. Pruritic erythematous rash Itchy red maculopapular rash that is due to ingestion of cake made with pure vanilla extract. Do not eat any more of that cake. Take hydroxyzine prn for itching, and apply corticosteroid cream until rash resolved.  - hydrOXYzine (ATARAX) 25 MG tablet; Take 1-2 tablets (25-50 mg total) by mouth every 6 (six) hours as needed for itching.  Dispense: 30 tablet; Refill: 0 - triamcinolone cream (KENALOG) 0.1 %; Apply 1 Application topically 2 (two) times daily. To affected area for itching or until rash resolves.  Dispense: 45 g; Refill: 1  3. Allergic reaction to food additive See problem #2 - hydrOXYzine (ATARAX) 25 MG tablet; Take 1-2 tablets (25-50 mg total) by mouth every 6 (six) hours as needed for itching.  Dispense: 30 tablet; Refill: 0 - methylPREDNISolone acetate (DEPO-MEDROL) injection 80 mg - triamcinolone cream (KENALOG) 0.1 %; Apply 1 Application topically 2 (two) times daily. To affected area for itching or until rash resolves.  Dispense: 45 g; Refill: 1   General Counseling: Summer Murphy verbalizes understanding of the findings of todays visit and agrees with plan of treatment. I have discussed any further diagnostic evaluation that may be needed or ordered today. We also reviewed her medications today. she has been encouraged to call the office with any questions or concerns that should arise related to todays visit.    No orders of the defined types were placed in this encounter.   Meds ordered this encounter  Medications   hydrOXYzine (ATARAX) 25 MG tablet    Sig: Take 1-2 tablets (25-50 mg total) by mouth every 6 (six) hours as needed for itching.    Dispense:  30 tablet    Refill:  0   methylPREDNISolone acetate (DEPO-MEDROL) injection 80 mg   triamcinolone cream (KENALOG) 0.1 %     Sig: Apply 1 Application topically 2 (two) times daily. To affected area for itching or until rash resolves.    Dispense:  45 g    Refill:  1    Return in about 6 months (around 04/30/2022) for F/U, pulmonary only with DSK or lauren.   Total time spent:30 Minutes Time spent includes review of chart, medications, test results, and follow up plan with the patient.   [place] Controlled Substance Database was reviewed by me.  This patient was seen by Jonetta Osgood, FNP-C in collaboration with Dr. Clayborn Bigness as a part of collaborative care agreement.   Summer Rakers R. Valetta Fuller, MSN, FNP-C Internal medicine

## 2021-10-30 DIAGNOSIS — K3184 Gastroparesis: Secondary | ICD-10-CM | POA: Diagnosis not present

## 2021-10-30 DIAGNOSIS — K449 Diaphragmatic hernia without obstruction or gangrene: Secondary | ICD-10-CM | POA: Diagnosis not present

## 2021-10-30 DIAGNOSIS — R1310 Dysphagia, unspecified: Secondary | ICD-10-CM | POA: Diagnosis not present

## 2021-10-30 NOTE — Procedures (Signed)
[facility] [address] [place], [address]    Complete Pulmonary Function Testing Interpretation:  FINDINGS:  Forced vital capacity is mildly decreased FEV1 is 1.41 L which is 63% predicted and is moderately decreased.  F1 FVC ratio is mildly decreased.  Postbronchodilator no significant change in FEV1.  Total lung capacity normal residual and is increased residual on total capacity ratio is increased FRC is normal.  DLCO was increased.  IMPRESSION:  Pulmonary function study is suggestive of moderate obstructive lung disease  Allyne Gee, MD [facility] Pulmonary Critical Care Medicine Sleep Medicine

## 2021-11-05 ENCOUNTER — Other Ambulatory Visit: Payer: Self-pay | Admitting: Nurse Practitioner

## 2021-11-05 DIAGNOSIS — K2100 Gastro-esophageal reflux disease with esophagitis, without bleeding: Secondary | ICD-10-CM

## 2021-11-05 DIAGNOSIS — R112 Nausea with vomiting, unspecified: Secondary | ICD-10-CM

## 2021-11-06 ENCOUNTER — Encounter: Payer: Self-pay | Admitting: Nurse Practitioner

## 2021-11-07 ENCOUNTER — Encounter: Payer: Self-pay | Admitting: Nurse Practitioner

## 2021-11-07 ENCOUNTER — Telehealth: Payer: Self-pay

## 2021-11-07 LAB — PULMONARY FUNCTION TEST

## 2021-11-07 NOTE — Telephone Encounter (Signed)
MR for 01/20/21, 12/20/20 & 10/20/20 faxed back to [facility] as requested; [phone]

## 2021-12-01 DIAGNOSIS — S8012XA Contusion of left lower leg, initial encounter: Secondary | ICD-10-CM | POA: Diagnosis not present

## 2021-12-01 DIAGNOSIS — M84362A Stress fracture, left tibia, initial encounter for fracture: Secondary | ICD-10-CM | POA: Diagnosis not present

## 2021-12-01 DIAGNOSIS — M25562 Pain in left knee: Secondary | ICD-10-CM | POA: Diagnosis not present

## 2021-12-02 ENCOUNTER — Other Ambulatory Visit: Payer: Self-pay

## 2021-12-02 ENCOUNTER — Ambulatory Visit: Payer: Medicare Other | Admitting: Nurse Practitioner

## 2021-12-02 MED ORDER — BUSPIRONE HCL 10 MG PO TABS: 10.0000 mg | ORAL_TABLET | Freq: Two times a day (BID) | ORAL | 0 refills | Status: DC

## 2021-12-02 MED ORDER — TRAZODONE HCL 150 MG PO TABS: 150.0000 mg | ORAL_TABLET | Freq: Every day | ORAL | 0 refills | Status: DC

## 2021-12-02 MED ORDER — DULOXETINE HCL 60 MG PO CPEP: 60.0000 mg | ORAL_CAPSULE | Freq: Every day | ORAL | 0 refills | Status: DC

## 2021-12-02 NOTE — Telephone Encounter (Signed)
Pt called and was upset and stating that she was out of her psych medications and that the place she was going to ([facility]) had closed and pt wasn't notified about them closing.  Pt is asking if we can prescribe her medications Debria Garret finds another psych provider.  Per Alyssa she approved [place] sending in buspirone, duloxetine, and trazodone for 30 days and pt will come in for appt on 12/03/21 to discuss the medications she received from Psych.

## 2021-12-03 ENCOUNTER — Encounter: Payer: Self-pay | Admitting: Nurse Practitioner

## 2021-12-03 ENCOUNTER — Other Ambulatory Visit: Payer: Self-pay | Admitting: Orthopedic Surgery

## 2021-12-03 ENCOUNTER — Ambulatory Visit (INDEPENDENT_AMBULATORY_CARE_PROVIDER_SITE_OTHER): Payer: Medicare Other | Admitting: Nurse Practitioner

## 2021-12-03 VITALS — BP 119/74 | HR 109 | Temp 98.1°F | Resp 16 | Ht 63.0 in | Wt 167.4 lb

## 2021-12-03 DIAGNOSIS — F418 Other specified anxiety disorders: Secondary | ICD-10-CM | POA: Diagnosis not present

## 2021-12-03 DIAGNOSIS — F3342 Major depressive disorder, recurrent, in full remission: Secondary | ICD-10-CM

## 2021-12-03 DIAGNOSIS — Z23 Encounter for immunization: Secondary | ICD-10-CM | POA: Diagnosis not present

## 2021-12-03 DIAGNOSIS — I10 Essential (primary) hypertension: Secondary | ICD-10-CM

## 2021-12-03 DIAGNOSIS — M84362A Stress fracture, left tibia, initial encounter for fracture: Secondary | ICD-10-CM

## 2021-12-03 DIAGNOSIS — Z5189 Encounter for other specified aftercare: Secondary | ICD-10-CM

## 2021-12-03 DIAGNOSIS — S8012XA Contusion of left lower leg, initial encounter: Secondary | ICD-10-CM

## 2021-12-03 MED ORDER — DULOXETINE HCL 60 MG PO CPEP: 60.0000 mg | ORAL_CAPSULE | Freq: Every day | ORAL | 1 refills | Status: DC

## 2021-12-03 MED ORDER — CLONAZEPAM 0.5 MG PO TABS: 0.5000 mg | ORAL_TABLET | Freq: Two times a day (BID) | ORAL | 0 refills | Status: DC | PRN

## 2021-12-03 MED ORDER — TRAZODONE HCL 150 MG PO TABS: 150.0000 mg | ORAL_TABLET | Freq: Every day | ORAL | 0 refills | Status: DC

## 2021-12-03 NOTE — Progress Notes (Signed)
[facility] [place], [place] [address]  Internal MEDICINE  Office Visit Note  Patient Name: Summer Murphy  [id number]  [id number]  Date of Service: 12/03/2021  Chief Complaint  Patient presents with   Follow-up    Follow up med refills   Depression   Gastroesophageal Reflux   Hypertension    HPI Summer Murphy presents for a follow up visit for hypertension, depression, anxiety, and medication refills.  --was started on several psychotropic medications approximately 20 years ago. This was during a difficult time involving her separating from her husband at that time. She was very depressed then. None of her medications have been adjusted or discontinued in a long time. She was seeing a provider at [facility] in [place] but they closed down earlier this year. Summer Murphy was not aware that they closed down until she recently tried to contact them to get her refills ordered.  --The meds that [facility] was prescribing were rexulti, clonazepam, duloxetine, buspirone, and trazodone. I sent refills of trazodone, duloxetine and buspirone prior to today's visit.  She has been out of her medications for at least a week now. She has experienced mild withdrawal symptoms including some nausea, mild dizziness, and lightheadedness.  --patient would prefer not to be referred to another specialist unless it is necessary or required for continued treatment.  --requests flu vaccine   Current Medication: Outpatient Encounter Medications as of 12/03/2021  Medication Sig   acetaminophen (TYLENOL) 500 MG tablet Take 2 tablets (1,000 mg total) by mouth every 6 (six) hours.   busPIRone (BUSPAR) 10 MG tablet Take 1 tablet (10 mg total) by mouth 2 (two) times daily.   diclofenac Sodium (VOLTAREN) 1 % GEL Apply 4 g topically 4 (four) times daily.   diltiazem (DILACOR XR) 180 MG 24 hr capsule TAKE 1 CAPSULE(180 MG) BY MOUTH DAILY   diphenhydrAMINE (BENADRYL) 25  MG tablet Take 50 mg by mouth 2 (two) times daily as needed for allergies.   diphenoxylate-atropine (LOMOTIL) 2.5-0.025 MG tablet Take 1 tablet by mouth 3 (three) times daily as needed for diarrhea or loose stools.   EPINEPHrine 0.3 mg/0.3 mL IJ SOAJ injection Inject 0.3 mg into the muscle as needed for anaphylaxis.   famotidine (PEPCID) 20 MG tablet Take 1 tablet (20 mg total) by mouth 2 (two) times daily.   fluticasone (FLONASE) 50 MCG/ACT nasal spray Place 2 sprays into both nostrils daily.   fluticasone furoate-vilanterol (BREO ELLIPTA) 100-25 MCG/ACT AEPB Inhale 1 puff into the lungs daily.   gabapentin (NEURONTIN) 600 MG tablet TAKE 1 TABLET BY MOUTH TWICE DAILY   hydrOXYzine (ATARAX) 25 MG tablet Take 1-2 tablets (25-50 mg total) by mouth every 6 (six) hours as needed for itching.   Ipratropium-Albuterol (COMBIVENT RESPIMAT) 20-100 MCG/ACT AERS respimat INHALE 1 PUFF BY MOUTH INTO THE LUNGS EVERY 6 HOURS   ipratropium-albuterol (DUONEB) 0.5-2.5 (3) MG/3ML SOLN Take 3 mLs by nebulization every 4 (four) hours as needed (for shortness of breath/wheezing).   loratadine (CLARITIN) 10 MG tablet Take 1 tablet (10 mg total) by mouth daily.   metoprolol tartrate (LOPRESSOR) 25 MG tablet Take 1 tablet (25 mg total) by mouth 2 (two) times daily.   montelukast (SINGULAIR) 10 MG tablet Take 1 tablet (10 mg total) by mouth at bedtime.   ondansetron (ZOFRAN) 4 MG tablet TAKE 1 TABLET BY MOUTH TWICE DAILY AS NEEDED   oxybutynin (DITROPAN) 5 MG tablet TAKE 1 TABLET( 5 MG TOTAL) BY MOUTH  IN THE MORNING AND AT BEDTIME   pantoprazole (PROTONIX) 40 MG tablet Take 40 mg by mouth 2 (two) times daily.   rOPINIRole (REQUIP) 0.5 MG tablet Take two tablets po BID for restless legs.   sennosides-docusate sodium (SENOKOT-S) 8.6-50 MG tablet Take 2 tablets by mouth daily.   SUMAtriptan (IMITREX) 100 MG tablet Take 1 tablet (100 mg total) by mouth every 2 (two) hours as needed (for migraine headaches.). May repeat in 1  hours if headache persists or recurs.   triamcinolone cream (KENALOG) 0.1 % Apply 1 Application topically 2 (two) times daily. To affected area for itching or until rash resolves.   valACYclovir (VALTREX) 500 MG tablet Take 1 tablet (500 mg total) by mouth daily as needed (for fever blisters).   [DISCONTINUED] clonazePAM (KLONOPIN) 0.5 MG tablet Take 0.5 mg by mouth at bedtime.   [DISCONTINUED] DULoxetine (CYMBALTA) 60 MG capsule Take 1 capsule (60 mg total) by mouth at bedtime.   [DISCONTINUED] REXULTI 3 MG TABS Take 1 tablet by mouth daily.   [DISCONTINUED] traZODone (DESYREL) 150 MG tablet Take 1 tablet (150 mg total) by mouth at bedtime.   clonazePAM (KLONOPIN) 0.5 MG tablet Take 1 tablet (0.5 mg total) by mouth 2 (two) times daily as needed for anxiety.   DULoxetine (CYMBALTA) 60 MG capsule Take 1 capsule (60 mg total) by mouth at bedtime.   traZODone (DESYREL) 150 MG tablet Take 1 tablet (150 mg total) by mouth at bedtime.   No facility-administered encounter medications on file as of 12/03/2021.    Surgical History: Past Surgical History:  Procedure Laterality Date   ABDOMINAL HYSTERECTOMY     AMPUTATION TOE Left 07/31/2016   Procedure: AMPUTATION TOE/MPJ 2nd toe;  Surgeon: Sharlotte Alamo, DPM;  Location: [facility] ORS;  Service: Podiatry;  Laterality: Left;   APPENDECTOMY  1990   BACK SURGERY     low back   BREAST SURGERY     bilateral breast reduction   CARDIAC ELECTROPHYSIOLOGY STUDY AND ABLATION     CHOLECYSTECTOMY  1990   COLONOSCOPY WITH PROPOFOL N/A 01/04/2017   Procedure: COLONOSCOPY WITH PROPOFOL;  Surgeon: Manya Silvas, MD;  Location: [facility] ENDOSCOPY;  Service: Endoscopy;  Laterality: N/A;   CORNEAL TRANSPLANT     ESOPHAGOGASTRODUODENOSCOPY N/A 04/09/2021   Procedure: ESOPHAGOGASTRODUODENOSCOPY (EGD);  Surgeon: Lin Landsman, MD;  Location: [facility] ENDOSCOPY;  Service: Gastroenterology;  Laterality: N/A;   ESOPHAGOGASTRODUODENOSCOPY (EGD) WITH PROPOFOL N/A 01/04/2017    Procedure: ESOPHAGOGASTRODUODENOSCOPY (EGD) WITH PROPOFOL;  Surgeon: Manya Silvas, MD;  Location: [facility] ENDOSCOPY;  Service: Endoscopy;  Laterality: N/A;   EXCISION BONE CYST Left 07/31/2016   Procedure: EXCISION BONE CYST/exostectomy 28124/left 2nd;  Surgeon: Sharlotte Alamo, DPM;  Location: [facility] OR[facility];  Service: Podiatry;  Laterality: Left;   EXTRACORPOREAL SHOCK WAVE LITHOTRIPSY Left 09/12/2015   Procedure: EXTRACORPOREAL SHOCK WAVE LITHOTRIPSY (ESWL);  Surgeon: Hollice Espy, MD;  Location: [facility] ORS;  Service: Urology;  Laterality: Left;   FRACTURE SURGERY     left foot   HARDWARE REMOVAL Left 11/14/2020   Procedure: HARDWARE REMOVAL;  Surgeon: Hessie Knows, MD;  Location: [facility] ORS;  Service: Orthopedics;  Laterality: Left;   HH repair     Fundoplication   JOINT REPLACEMENT Bilateral 2013,2014   total knees   LAPAROSCOPIC HYSTERECTOMY     LITHOTRIPSY     periprosthetic supracondylar fracture of left femur  02/16/2020   [facility]   TONSILLECTOMY     TOTAL KNEE REVISION Right 11/14/2019   Procedure: Revision patella  and tibial polyethylene;  Surgeon: Hessie Knows, MD;  Location: [facility] ORS;  Service: Orthopedics;  Laterality: Right;   URETEROSCOPY      Medical History: Past Medical History:  Diagnosis Date   Acid reflux    Anemia    Anxiety    Arrhythmia    treated with meds and has no current problems   Arthritis    most uncomfortable in knees   Asthma    uses several inhalers   Depression    Fever blister    Hematuria    History of kidney stones    Hypertension    Hypoglycemia    Left flank pain    Migraine    Pre-diabetes    Restless leg    Yeast vaginitis     Family History: Family History  Problem Relation Age of Onset   Hypertension Mother    Stroke Mother    Prostate cancer Father    Kidney Stones Father    Diabetes Brother    Hypertension Brother    Breast cancer Maternal Aunt    Kidney disease Neg Hx     Social History   Socioeconomic  History   Marital status: Widowed    Spouse name: Not on file   Number of children: Not on file   Years of education: Not on file   Highest education level: Not on file  Occupational History   Not on file  Tobacco Use   Smoking status: Never    Passive exposure: Past   Smokeless tobacco: Never  Vaping Use   Vaping Use: Never used  Substance and Sexual Activity   Alcohol use: No   Drug use: No   Sexual activity: Not on file  Other Topics Concern   Not on file  Social History Narrative   Not on file   Social Determinants of Health   Financial Resource Strain: Not on file  Food Insecurity: Not on file  Transportation Needs: Not on file  Physical Activity: Not on file  Stress: Not on file  Social Connections: Not on file  Intimate Partner Violence: Not on file      Review of Systems  Constitutional: Negative.  Negative for appetite change, chills, fatigue and fever.  HENT: Negative.    Respiratory:  Negative for cough, chest tightness, shortness of breath and wheezing.   Cardiovascular: Negative.  Negative for chest pain and palpitations.  Gastrointestinal:  Positive for nausea. Negative for abdominal pain, constipation, diarrhea and vomiting.  Musculoskeletal:  Positive for arthralgias.  Neurological:  Positive for dizziness, light-headedness and headaches.  Psychiatric/Behavioral:  Positive for sleep disturbance. Negative for agitation, behavioral problems, self-injury and suicidal ideas. The patient is nervous/anxious.     Vital Signs: BP 119/74   Pulse (!) 109   Temp 98.1 F (36.7 C)   Resp 16   Ht '5\' 3"'$  (1.6 m)   Wt 167 lb 6.4 oz (75.9 kg)   SpO2 95%   BMI 29.65 kg/m    Physical Exam Vitals reviewed.  Constitutional:      General: She is not in acute distress.    Appearance: Normal appearance. She is not ill-appearing.  HENT:     Head: Normocephalic and atraumatic.  Eyes:     Pupils: Pupils are equal, round, and reactive to light.  Cardiovascular:      Rate and Rhythm: Normal rate and regular rhythm.  Pulmonary:     Effort: Pulmonary effort is normal. No respiratory distress.  Neurological:  Mental Status: She is alert and oriented to person, place, and time.  Psychiatric:        Mood and Affect: Mood normal.        Behavior: Behavior normal.        Assessment/Plan: 1. Encounter for medication adjustment Has been off Rexulti for approximately 1 week so the initial withdrawal phase has passed. No involuntary movements consistent with tardive dyskinesia have been observed in the office or at home around family and friends. Rexulti is discontinued as of today, this medication is not necessary for the mental health of the patient and the possible side effects and risks of the medication are greater than any potential benefit from taking the medication at this time. Will continue to monitor for any long term withdrawal symptoms or changes related to the medication. Clonazepam dose increased to twice daily prn for now, will follow up in 3 weeks and adjust dose depending on patient symptoms and current status.  - clonazePAM (KLONOPIN) 0.5 MG tablet; Take 1 tablet (0.5 mg total) by mouth 2 (two) times daily as needed for anxiety.  Dispense: 60 tablet; Refill: 0  2. Essential hypertension Stable with current medications  3. Needs flu shot Flu vaccine administered in office today - Flu Vaccine MDCK QUAD PF  4. Situational anxiety Temporary increase in clonazepam, will adjust dose at next office visit in 3 weeks. Plan to increase buspirone dose and decrease clonazepam dose. - clonazePAM (KLONOPIN) 0.5 MG tablet; Take 1 tablet (0.5 mg total) by mouth 2 (two) times daily as needed for anxiety.  Dispense: 60 tablet; Refill: 0 - DULoxetine (CYMBALTA) 60 MG capsule; Take 1 capsule (60 mg total) by mouth at bedtime.  Dispense: 90 capsule; Refill: 1  5. Recurrent major depressive disorder, in full remission ([place]) Continue trazodone and  duloxetine for now. No changes, additional refills ordered - traZODone (DESYREL) 150 MG tablet; Take 1 tablet (150 mg total) by mouth at bedtime.  Dispense: 30 tablet; Refill: 0 - DULoxetine (CYMBALTA) 60 MG capsule; Take 1 capsule (60 mg total) by mouth at bedtime.  Dispense: 90 capsule; Refill: 1   General Counseling: Summer Murphy verbalizes understanding of the findings of todays visit and agrees with plan of treatment. I have discussed any further diagnostic evaluation that may be needed or ordered today. We also reviewed her medications today. she has been encouraged to call the office with any questions or concerns that should arise related to todays visit.    Orders Placed This Encounter  Procedures   Flu Vaccine MDCK QUAD PF    Meds ordered this encounter  Medications   clonazePAM (KLONOPIN) 0.5 MG tablet    Sig: Take 1 tablet (0.5 mg total) by mouth 2 (two) times daily as needed for anxiety.    Dispense:  60 tablet    Refill:  0   traZODone (DESYREL) 150 MG tablet    Sig: Take 1 tablet (150 mg total) by mouth at bedtime.    Dispense:  30 tablet    Refill:  0    For future refills   DULoxetine (CYMBALTA) 60 MG capsule    Sig: Take 1 capsule (60 mg total) by mouth at bedtime.    Dispense:  90 capsule    Refill:  1    For future refills    Return in about 3 weeks (around 12/24/2021) for F/U, Serigne Kubicek PCP to taper anxiety meds and reevaluate.   Total time spent:30 Minutes Time spent includes review of chart, medications,  test results, and follow up plan with the patient.   [place] Controlled Substance Database was reviewed by me.  This patient was seen by Jonetta Osgood, FNP-C in collaboration with Dr. Clayborn Bigness as a part of collaborative care agreement.   Summer Prisk R. Valetta Fuller, MSN, FNP-C Internal medicine

## 2021-12-05 DIAGNOSIS — H2513 Age-related nuclear cataract, bilateral: Secondary | ICD-10-CM | POA: Diagnosis not present

## 2021-12-08 ENCOUNTER — Telehealth: Payer: Self-pay

## 2021-12-08 NOTE — Telephone Encounter (Addendum)
Pt called that her BP 150/90 and 144/88 and having headache she is only diltiazem as  per dr Dr Humphrey Rolls advised her to follow low sodium diet and we will she her in morning 9:40 tomorrow and bring all her med  if she can wait until she  can go to Urgent care or ED

## 2021-12-09 ENCOUNTER — Ambulatory Visit: Payer: Medicare Other | Admitting: Internal Medicine

## 2021-12-09 ENCOUNTER — Telehealth: Payer: Self-pay

## 2021-12-09 ENCOUNTER — Encounter
Admission: RE | Admit: 2021-12-09 | Discharge: 2021-12-09 | Disposition: A | Discharge status: Home / Self Care | Payer: Medicare Other | Source: Ambulatory Visit | Attending: Orthopedic Surgery | Admitting: Orthopedic Surgery

## 2021-12-09 ENCOUNTER — Encounter: Payer: Self-pay | Admitting: Nurse Practitioner

## 2021-12-09 DIAGNOSIS — S8011XA Contusion of right lower leg, initial encounter: Secondary | ICD-10-CM | POA: Diagnosis not present

## 2021-12-09 DIAGNOSIS — S8012XA Contusion of left lower leg, initial encounter: Secondary | ICD-10-CM | POA: Insufficient documentation

## 2021-12-09 DIAGNOSIS — M84362A Stress fracture, left tibia, initial encounter for fracture: Secondary | ICD-10-CM | POA: Insufficient documentation

## 2021-12-09 MED ORDER — TECHNETIUM TC 99M TETROFOSMIN IV KIT
19.2600 | PACK | Freq: Once | INTRAVENOUS | Status: AC | PRN
  Administered 2021-12-09: 19.26 via INTRAVENOUS

## 2021-12-09 NOTE — Telephone Encounter (Signed)
Spoke with pt her BP 142/77 this advised her that continue med as prescribed and low sodium diet and follow up Thursday

## 2021-12-10 ENCOUNTER — Telehealth: Payer: Medicare Other

## 2021-12-10 ENCOUNTER — Ambulatory Visit: Payer: Medicare Other | Admitting: Nurse Practitioner

## 2021-12-10 DIAGNOSIS — M84362A Stress fracture, left tibia, initial encounter for fracture: Secondary | ICD-10-CM | POA: Diagnosis not present

## 2021-12-11 ENCOUNTER — Encounter: Payer: Self-pay | Admitting: Internal Medicine

## 2021-12-11 ENCOUNTER — Ambulatory Visit (INDEPENDENT_AMBULATORY_CARE_PROVIDER_SITE_OTHER): Payer: Medicare Other | Admitting: Internal Medicine

## 2021-12-11 VITALS — BP 130/84 | HR 83 | Temp 98.0°F | Resp 16 | Ht 63.0 in | Wt 167.8 lb

## 2021-12-11 DIAGNOSIS — I10 Essential (primary) hypertension: Secondary | ICD-10-CM | POA: Diagnosis not present

## 2021-12-11 DIAGNOSIS — R682 Dry mouth, unspecified: Secondary | ICD-10-CM

## 2021-12-11 DIAGNOSIS — B3781 Candidal esophagitis: Secondary | ICD-10-CM | POA: Diagnosis not present

## 2021-12-11 DIAGNOSIS — B370 Candidal stomatitis: Secondary | ICD-10-CM | POA: Diagnosis not present

## 2021-12-11 MED ORDER — DILTIAZEM HCL ER COATED BEADS 240 MG PO CP24: 240.0000 mg | ORAL_CAPSULE | Freq: Every day | ORAL | 3 refills | Status: DC

## 2021-12-11 MED ORDER — NYSTATIN 100000 UNIT/ML MT SUSP: OROMUCOSAL | 0 refills | Status: DC

## 2021-12-11 NOTE — Progress Notes (Signed)
[facility] [place], [place] [address]  Internal MEDICINE  Office Visit Note  Patient Name: Summer Murphy  [id number]  [id number]  Date of Service: 12/11/2021  Chief Complaint  Patient presents with   Hypertension   Medication Reaction    Metoprolol is causing heart pouding    HPI  Patient is being seen today for acute visit At home readings of her blood pressure has been elevated patient was given metoprolol however she had intolerance and feeling off pounding in her chest.  She is also on diltiazem 180 mg once a day.  Blood pressure is consistently elevated above 440 systolic Patient also complaining of rash and metallic taste in her mouth she thinks is because of using her inhalers Excessive dryness of her mouth she is on multiple agents which can cause dryness in her mouth she was given a prescription of pilocarpine for the dryness, patient is instructed to bring all her medications next visit   Current Medication: Outpatient Encounter Medications as of 12/11/2021  Medication Sig   acetaminophen (TYLENOL) 500 MG tablet Take 2 tablets (1,000 mg total) by mouth every 6 (six) hours.   busPIRone (BUSPAR) 10 MG tablet Take 1 tablet (10 mg total) by mouth 2 (two) times daily.   clonazePAM (KLONOPIN) 0.5 MG tablet Take 1 tablet (0.5 mg total) by mouth 2 (two) times daily as needed for anxiety.   diclofenac Sodium (VOLTAREN) 1 % GEL Apply 4 g topically 4 (four) times daily.   diltiazem (CARTIA XT) 240 MG 24 hr capsule Take 1 capsule (240 mg total) by mouth daily.   DULoxetine (CYMBALTA) 60 MG capsule Take 1 capsule (60 mg total) by mouth at bedtime.   EPINEPHrine 0.3 mg/0.3 mL IJ SOAJ injection Inject 0.3 mg into the muscle as needed for anaphylaxis.   famotidine (PEPCID) 20 MG tablet Take 1 tablet (20 mg total) by mouth 2 (two) times daily.   fluticasone (FLONASE) 50 MCG/ACT nasal spray Place 2 sprays into both nostrils daily.   fluticasone  furoate-vilanterol (BREO ELLIPTA) 100-25 MCG/ACT AEPB Inhale 1 puff into the lungs daily.   gabapentin (NEURONTIN) 600 MG tablet TAKE 1 TABLET BY MOUTH TWICE DAILY   hydrOXYzine (ATARAX) 25 MG tablet Take 1-2 tablets (25-50 mg total) by mouth every 6 (six) hours as needed for itching.   Ipratropium-Albuterol (COMBIVENT RESPIMAT) 20-100 MCG/ACT AERS respimat INHALE 1 PUFF BY MOUTH INTO THE LUNGS EVERY 6 HOURS   ipratropium-albuterol (DUONEB) 0.5-2.5 (3) MG/3ML SOLN Take 3 mLs by nebulization every 4 (four) hours as needed (for shortness of breath/wheezing).   loratadine (CLARITIN) 10 MG tablet Take 1 tablet (10 mg total) by mouth daily.   montelukast (SINGULAIR) 10 MG tablet Take 1 tablet (10 mg total) by mouth at bedtime.   nystatin (MYCOSTATIN) 100000 UNIT/ML suspension 5 cc swish and swallow after each meal and and bed time x 5 days and then once a day as needed   ondansetron (ZOFRAN) 4 MG tablet TAKE 1 TABLET BY MOUTH TWICE DAILY AS NEEDED   pantoprazole (PROTONIX) 40 MG tablet Take 40 mg by mouth 2 (two) times daily.   pilocarpine (SALAGEN) 5 MG tablet Take 5 mg by mouth 2 (two) times daily.   rOPINIRole (REQUIP) 0.5 MG tablet Take two tablets po BID for restless legs.   sennosides-docusate sodium (SENOKOT-S) 8.6-50 MG tablet Take 2 tablets by mouth daily.   SUMAtriptan (IMITREX) 100 MG tablet Take 1 tablet (100 mg total) by mouth every 2 (  two) hours as needed (for migraine headaches.). May repeat in 1 hours if headache persists or recurs.   traZODone (DESYREL) 150 MG tablet Take 1 tablet (150 mg total) by mouth at bedtime.   triamcinolone cream (KENALOG) 0.1 % Apply 1 Application topically 2 (two) times daily. To affected area for itching or until rash resolves.   valACYclovir (VALTREX) 500 MG tablet Take 1 tablet (500 mg total) by mouth daily as needed (for fever blisters).   [DISCONTINUED] diltiazem (DILACOR XR) 180 MG 24 hr capsule TAKE 1 CAPSULE(180 MG) BY MOUTH DAILY   [DISCONTINUED]  diphenhydrAMINE (BENADRYL) 25 MG tablet Take 50 mg by mouth 2 (two) times daily as needed for allergies.   [DISCONTINUED] diphenoxylate-atropine (LOMOTIL) 2.5-0.025 MG tablet Take 1 tablet by mouth 3 (three) times daily as needed for diarrhea or loose stools.   [DISCONTINUED] metoprolol tartrate (LOPRESSOR) 25 MG tablet Take 1 tablet (25 mg total) by mouth 2 (two) times daily.   [DISCONTINUED] oxybutynin (DITROPAN) 5 MG tablet TAKE 1 TABLET( 5 MG TOTAL) BY MOUTH IN THE MORNING AND AT BEDTIME   No facility-administered encounter medications on file as of 12/11/2021.    Surgical History: Past Surgical History:  Procedure Laterality Date   ABDOMINAL HYSTERECTOMY     AMPUTATION TOE Left 07/31/2016   Procedure: AMPUTATION TOE/MPJ 2nd toe;  Surgeon: Sharlotte Alamo, DPM;  Location: [facility];  Service: Podiatry;  Laterality: Left;   APPENDECTOMY  1990   BACK SURGERY     low back   BREAST SURGERY     bilateral breast reduction   CARDIAC ELECTROPHYSIOLOGY STUDY AND ABLATION     CHOLECYSTECTOMY  1990   COLONOSCOPY WITH PROPOFOL N/A 01/04/2017   Procedure: COLONOSCOPY WITH PROPOFOL;  Surgeon: Manya Silvas, MD;  Location: [facility];  Service: Endoscopy;  Laterality: N/A;   CORNEAL TRANSPLANT     ESOPHAGOGASTRODUODENOSCOPY N/A 04/09/2021   Procedure: ESOPHAGOGASTRODUODENOSCOPY (EGD);  Surgeon: Lin Landsman, MD;  Location: [facility];  Service: Gastroenterology;  Laterality: N/A;   ESOPHAGOGASTRODUODENOSCOPY (EGD) WITH PROPOFOL N/A 01/04/2017   Procedure: ESOPHAGOGASTRODUODENOSCOPY (EGD) WITH PROPOFOL;  Surgeon: Manya Silvas, MD;  Location: [facility];  Service: Endoscopy;  Laterality: N/A;   EXCISION BONE CYST Left 07/31/2016   Procedure: EXCISION BONE CYST/exostectomy 28124/left 2nd;  Surgeon: Sharlotte Alamo, DPM;  Location: [facility];  Service: Podiatry;  Laterality: Left;   EXTRACORPOREAL SHOCK WAVE LITHOTRIPSY Left 09/12/2015   Procedure: EXTRACORPOREAL SHOCK WAVE  LITHOTRIPSY (ESWL);  Surgeon: Hollice Espy, MD;  Location: [facility];  Service: Urology;  Laterality: Left;   FRACTURE SURGERY     left foot   HARDWARE REMOVAL Left 11/14/2020   Procedure: HARDWARE REMOVAL;  Surgeon: Hessie Knows, MD;  Location: [facility];  Service: Orthopedics;  Laterality: Left;   [place] repair     Fundoplication   JOINT REPLACEMENT Bilateral 2013,2014   total knees   LAPAROSCOPIC HYSTERECTOMY     LITHOTRIPSY     periprosthetic supracondylar fracture of left femur  02/16/2020   [facility]   TONSILLECTOMY     TOTAL KNEE REVISION Right 11/14/2019   Procedure: Revision patella and tibial polyethylene;  Surgeon: Hessie Knows, MD;  Location: [facility];  Service: Orthopedics;  Laterality: Right;   URETEROSCOPY      Medical History: Past Medical History:  Diagnosis Date   Acid reflux    Anemia    Anxiety    Arrhythmia    treated with meds and has no current problems   Arthritis  most uncomfortable in knees   Asthma    uses several inhalers   Depression    Fever blister    Hematuria    History of kidney stones    Hypertension    Hypoglycemia    Left flank pain    Migraine    Pre-diabetes    Restless leg    Yeast vaginitis     Family History: Family History  Problem Relation Age of Onset   Hypertension Mother    Stroke Mother    Prostate cancer Father    Kidney Stones Father    Diabetes Brother    Hypertension Brother    Breast cancer Maternal Aunt    Kidney disease Neg Hx     Social History   Socioeconomic History   Marital status: Widowed    Spouse name: Not on file   Number of children: Not on file   Years of education: Not on file   Highest education level: Not on file  Occupational History   Not on file  Tobacco Use   Smoking status: Never    Passive exposure: Past   Smokeless tobacco: Never  Vaping Use   Vaping Use: Never used  Substance and Sexual Activity   Alcohol use: No   Drug use: No   Sexual activity: Not on file   Other Topics Concern   Not on file  Social History Narrative   Not on file   Social Determinants of Health   Financial Resource Strain: Not on file  Food Insecurity: Not on file  Transportation Needs: Not on file  Physical Activity: Not on file  Stress: Not on file  Social Connections: Not on file  Intimate Partner Violence: Not on file      Review of Systems  Constitutional:  Negative for chills, fatigue and unexpected weight change.  HENT:  Negative for congestion, postnasal drip, rhinorrhea, sneezing and sore throat.        Excessive dryness in her mouth. Rash and metallic taste also worse  Eyes:  Negative for redness.  Respiratory:  Negative for cough, chest tightness and shortness of breath.   Cardiovascular:  Positive for palpitations. Negative for chest pain.  Gastrointestinal:  Negative for abdominal pain, constipation, diarrhea, nausea and vomiting.  Genitourinary:  Negative for dysuria and frequency.  Musculoskeletal:  Negative for arthralgias, back pain, joint swelling and neck pain.  Skin:  Negative for rash.  Neurological: Negative.  Negative for tremors and numbness.  Hematological:  Negative for adenopathy. Does not bruise/bleed easily.  Psychiatric/Behavioral:  Negative for behavioral problems (Depression), sleep disturbance and suicidal ideas. The patient is not nervous/anxious.     Vital Signs: BP 130/84   Pulse 83   Temp 98 F (36.7 C)   Resp 16   Ht '5\' 3"'$  (1.6 m)   Wt 167 lb 12.8 oz (76.1 kg)   SpO2 98%   BMI 29.72 kg/m    Physical Exam Constitutional:      Appearance: Normal appearance.  HENT:     Head: Normocephalic and atraumatic.     Nose: Nose normal.     Mouth/Throat:     Mouth: Mucous membranes are dry.     Pharynx: Posterior oropharyngeal erythema present.  Eyes:     Extraocular Movements: Extraocular movements intact.     Pupils: Pupils are equal, round, and reactive to light.  Cardiovascular:     Pulses: Normal pulses.      Heart sounds: Normal heart sounds.  Pulmonary:  Effort: Pulmonary effort is normal.     Breath sounds: Normal breath sounds.  Neurological:     General: No focal deficit present.     Mental Status: She is alert.  Psychiatric:        Mood and Affect: Mood normal.        Behavior: Behavior normal.        Assessment/Plan: 1. Uncontrolled hypertension Stop metoprolol due to intolerance, increase diltiazem for optimal blood pressure control, new prescription is given with a dose change is from 180-240 - diltiazem (CARTIA XT) 240 MG 24 hr capsule; Take 1 capsule (240 mg total) by mouth daily.  Dispense: 90 capsule; Refill: 3  2. Dry mouth Patient is on multiple agents which can cause excessive dryness,  is only taking them as needed, patient is instructed to bring all her medication on next visit - pilocarpine (SALAGEN) 5 MG tablet; Take 5 mg by mouth 2 (two) times daily.  3. Thrush of mouth and esophagus ([place]) Patient also has evidence of thrush due to use of steroid inhaler if her dryness continues patient might need to see rheumatology/dermatology might need a biopsy of her saliva glands - nystatin (MYCOSTATIN) 100000 UNIT/ML suspension; 5 cc swish and swallow after each meal and and bed time x 5 days and then once a day as needed  Dispense: 240 mL; Refill: 0   General Counseling: Kemaria verbalizes understanding of the findings of todays visit and agrees with plan of treatment. I have discussed any further diagnostic evaluation that may be needed or ordered today. We also reviewed her medications today. she has been encouraged to call the office with any questions or concerns that should arise related to todays visit.    No orders of the defined types were placed in this encounter.   Meds ordered this encounter  Medications   nystatin (MYCOSTATIN) 100000 UNIT/ML suspension    Sig: 5 cc swish and swallow after each meal and and bed time x 5 days and then once a day as needed     Dispense:  240 mL    Refill:  0   diltiazem (CARTIA XT) 240 MG 24 hr capsule    Sig: Take 1 capsule (240 mg total) by mouth daily.    Dispense:  90 capsule    Refill:  3    This is change in dose, please dc 180 mg    Total time spent:35 Minutes Time spent includes review of chart, medications, test results, and follow up plan with the patient.   [place] Controlled Substance Database was reviewed by me.   Dr Lavera Guise Internal medicine

## 2021-12-17 ENCOUNTER — Other Ambulatory Visit: Payer: Self-pay | Admitting: Internal Medicine

## 2021-12-31 ENCOUNTER — Ambulatory Visit (INDEPENDENT_AMBULATORY_CARE_PROVIDER_SITE_OTHER): Payer: Medicare Other | Admitting: Nurse Practitioner

## 2021-12-31 ENCOUNTER — Encounter: Payer: Self-pay | Admitting: Nurse Practitioner

## 2021-12-31 VITALS — BP 132/68 | HR 99 | Temp 98.3°F | Resp 16 | Ht 63.0 in | Wt 164.4 lb

## 2021-12-31 DIAGNOSIS — F418 Other specified anxiety disorders: Secondary | ICD-10-CM | POA: Diagnosis not present

## 2021-12-31 DIAGNOSIS — Z760 Encounter for issue of repeat prescription: Secondary | ICD-10-CM | POA: Diagnosis not present

## 2021-12-31 DIAGNOSIS — F3342 Major depressive disorder, recurrent, in full remission: Secondary | ICD-10-CM

## 2021-12-31 DIAGNOSIS — Z5189 Encounter for other specified aftercare: Secondary | ICD-10-CM | POA: Diagnosis not present

## 2021-12-31 DIAGNOSIS — G63 Polyneuropathy in diseases classified elsewhere: Secondary | ICD-10-CM | POA: Diagnosis not present

## 2021-12-31 DIAGNOSIS — J301 Allergic rhinitis due to pollen: Secondary | ICD-10-CM

## 2021-12-31 MED ORDER — MONTELUKAST SODIUM 10 MG PO TABS: 10.0000 mg | ORAL_TABLET | Freq: Every day | ORAL | 1 refills | Status: DC

## 2021-12-31 MED ORDER — TRAZODONE HCL 150 MG PO TABS: 150.0000 mg | ORAL_TABLET | Freq: Every day | ORAL | 2 refills | Status: DC

## 2021-12-31 MED ORDER — GABAPENTIN 600 MG PO TABS: 600.0000 mg | ORAL_TABLET | Freq: Two times a day (BID) | ORAL | 3 refills | Status: DC

## 2021-12-31 MED ORDER — CLONAZEPAM 1 MG PO TABS: 0.5000 mg | ORAL_TABLET | Freq: Two times a day (BID) | ORAL | 1 refills | Status: DC | PRN

## 2021-12-31 MED ORDER — BUSPIRONE HCL 10 MG PO TABS: 10.0000 mg | ORAL_TABLET | Freq: Two times a day (BID) | ORAL | 2 refills | Status: DC

## 2021-12-31 NOTE — Progress Notes (Signed)
[facility] [place], [place] [address]  Internal MEDICINE  Office Visit Note  Patient Name: Summer Murphy  [id number]  [id number]  Date of Service: 12/31/2021  Chief Complaint  Patient presents with   Follow-up   Depression   Gastroesophageal Reflux   Hypertension    HPI Summer Murphy presents for a follow visit for medication adjustment, anxiety, hot flashes.  Hot flashes -- wants to take medication to help control her hot flashes Anxiety -- doing ok with the medication changes Mood w/o rexulti -- no negative changes in mood without rexulti, and no signs of withdrawal Low dose estrogen therapy for vasomotor sx of menopause -- had hysterectomy, risk of cancer?    Current Medication: Outpatient Encounter Medications as of 12/31/2021  Medication Sig   acetaminophen (TYLENOL) 500 MG tablet Take 2 tablets (1,000 mg total) by mouth every 6 (six) hours.   clonazePAM (KLONOPIN) 1 MG tablet Take 0.5 tablets (0.5 mg total) by mouth 2 (two) times daily as needed for anxiety.   diclofenac Sodium (VOLTAREN) 1 % GEL Apply 4 g topically 4 (four) times daily.   diltiazem (CARTIA XT) 240 MG 24 hr capsule Take 1 capsule (240 mg total) by mouth daily.   DULoxetine (CYMBALTA) 60 MG capsule Take 1 capsule (60 mg total) by mouth at bedtime.   EPINEPHrine 0.3 mg/0.3 mL IJ SOAJ injection Inject 0.3 mg into the muscle as needed for anaphylaxis.   famotidine (PEPCID) 20 MG tablet Take 1 tablet (20 mg total) by mouth 2 (two) times daily.   fluticasone (FLONASE) 50 MCG/ACT nasal spray Place 2 sprays into both nostrils daily.   fluticasone furoate-vilanterol (BREO ELLIPTA) 100-25 MCG/ACT AEPB Inhale 1 puff into the lungs daily.   hydrOXYzine (ATARAX) 25 MG tablet Take 1-2 tablets (25-50 mg total) by mouth every 6 (six) hours as needed for itching.   Ipratropium-Albuterol (COMBIVENT RESPIMAT) 20-100 MCG/ACT AERS respimat INHALE 1 PUFF BY MOUTH INTO THE LUNGS EVERY 6 HOURS    ipratropium-albuterol (DUONEB) 0.5-2.5 (3) MG/3ML SOLN Take 3 mLs by nebulization every 4 (four) hours as needed (for shortness of breath/wheezing).   loratadine (CLARITIN) 10 MG tablet Take 1 tablet (10 mg total) by mouth daily.   nystatin (MYCOSTATIN) 100000 UNIT/ML suspension 5 cc swish and swallow after each meal and and bed time x 5 days and then once a day as needed   ondansetron (ZOFRAN) 4 MG tablet TAKE 1 TABLET BY MOUTH TWICE DAILY AS NEEDED   pantoprazole (PROTONIX) 40 MG tablet Take 40 mg by mouth 2 (two) times daily.   pilocarpine (SALAGEN) 5 MG tablet Take 5 mg by mouth 2 (two) times daily.   rOPINIRole (REQUIP) 0.5 MG tablet Take two tablets po BID for restless legs.   sennosides-docusate sodium (SENOKOT-S) 8.6-50 MG tablet Take 2 tablets by mouth daily.   SUMAtriptan (IMITREX) 100 MG tablet Take 1 tablet (100 mg total) by mouth every 2 (two) hours as needed (for migraine headaches.). May repeat in 1 hours if headache persists or recurs.   triamcinolone cream (KENALOG) 0.1 % Apply 1 Application topically 2 (two) times daily. To affected area for itching or until rash resolves.   valACYclovir (VALTREX) 500 MG tablet Take 1 tablet (500 mg total) by mouth daily as needed (for fever blisters).   [DISCONTINUED] busPIRone (BUSPAR) 10 MG tablet Take 1 tablet (10 mg total) by mouth 2 (two) times daily.   [DISCONTINUED] clonazePAM (KLONOPIN) 0.5 MG tablet Take 1 tablet (0.5 mg total)  by mouth 2 (two) times daily as needed for anxiety.   [DISCONTINUED] gabapentin (NEURONTIN) 600 MG tablet TAKE 1 TABLET BY MOUTH TWICE DAILY   [DISCONTINUED] montelukast (SINGULAIR) 10 MG tablet Take 1 tablet (10 mg total) by mouth at bedtime.   [DISCONTINUED] traZODone (DESYREL) 150 MG tablet Take 1 tablet (150 mg total) by mouth at bedtime.   busPIRone (BUSPAR) 10 MG tablet Take 1 tablet (10 mg total) by mouth 2 (two) times daily.   gabapentin (NEURONTIN) 600 MG tablet Take 1 tablet (600 mg total) by mouth 2  (two) times daily.   montelukast (SINGULAIR) 10 MG tablet Take 1 tablet (10 mg total) by mouth at bedtime.   traZODone (DESYREL) 150 MG tablet Take 1 tablet (150 mg total) by mouth at bedtime.   No facility-administered encounter medications on file as of 12/31/2021.    Surgical History: Past Surgical History:  Procedure Laterality Date   ABDOMINAL HYSTERECTOMY     AMPUTATION TOE Left 07/31/2016   Procedure: AMPUTATION TOE/MPJ 2nd toe;  Surgeon: Sharlotte Alamo, DPM;  Location: [facility] ORS;  Service: Podiatry;  Laterality: Left;   APPENDECTOMY  1990   BACK SURGERY     low back   BREAST SURGERY     bilateral breast reduction   CARDIAC ELECTROPHYSIOLOGY STUDY AND ABLATION     CHOLECYSTECTOMY  1990   COLONOSCOPY WITH PROPOFOL N/A 01/04/2017   Procedure: COLONOSCOPY WITH PROPOFOL;  Surgeon: Manya Silvas, MD;  Location[facility] ENDOSCOPY;  Service: Endoscopy;  Laterality: N/A;   CORNEAL TRANSPLANT     ESOPHAGOGASTRODUODENOSCOPY N/A 04/09/2021   Procedure: ESOPHAGOGASTRODUODENOSCOPY (EGD);  Surgeon: Lin Landsman, MD;  Location: [facility] ENDOSCOPY;  Service: Gastroenterology;  Laterality: N/A;   ESOPHAGOGASTRODUODENOSCOPY (EGD) WITH PROPOFOL N/A 01/04/2017   Procedure: ESOPHAGOGASTRODUODENOSCOPY (EGD) WITH PROPOFOL;  Surgeon: Manya Silvas, MD;  Location: [facility] ENDOSCOPY;  Service: Endoscopy;  Laterality: N/A;   EXCISION BONE CYST Left 07/31/2016   Procedure: EXCISION BONE CYST/exostectomy 28124/left 2nd;  Surgeon: Sharlotte Alamo, DPM;  Location: [facility] ORS;  Service: Podiatry;  Laterality: Left;   EXTRACORPOREAL SHOCK WAVE LITHOTRIPSY Left 09/12/2015   Procedure: EXTRACORPOREAL SHOCK WAVE LITHOTRIPSY (ESWL);  Surgeon: Hollice Espy, MD;  Location: [facility] ORS;  Service: Urology;  Laterality: Left;   FRACTURE SURGERY     left foot   HARDWARE REMOVAL Left 11/14/2020   Procedure: HARDWARE REMOVAL;  Surgeon: Hessie Knows, MD;  Location: [facility] ORS;  Service: Orthopedics;  Laterality: Left;   [place]  repair     Fundoplication   JOINT REPLACEMENT Bilateral 2013,2014   total knees   LAPAROSCOPIC HYSTERECTOMY     LITHOTRIPSY     periprosthetic supracondylar fracture of left femur  02/16/2020   [facility]   TONSILLECTOMY     TOTAL KNEE REVISION Right 11/14/2019   Procedure: Revision patella and tibial polyethylene;  Surgeon: Hessie Knows, MD;  Location: [facility] ORS;  Service: Orthopedics;  Laterality: Right;   URETEROSCOPY      Medical History: Past Medical History:  Diagnosis Date   Acid reflux    Anemia    Anxiety    Arrhythmia    treated with meds and has no current problems   Arthritis    most uncomfortable in knees   Asthma    uses several inhalers   Depression    Fever blister    Hematuria    History of kidney stones    Hypertension    Hypoglycemia    Left flank pain    Migraine  Pre-diabetes    Restless leg    Yeast vaginitis     Family History: Family History  Problem Relation Age of Onset   Hypertension Mother    Stroke Mother    Prostate cancer Father    Kidney Stones Father    Diabetes Brother    Hypertension Brother    Breast cancer Maternal Aunt    Kidney disease Neg Hx     Social History   Socioeconomic History   Marital status: Widowed    Spouse name: Not on file   Number of children: Not on file   Years of education: Not on file   Highest education level: Not on file  Occupational History   Not on file  Tobacco Use   Smoking status: Never    Passive exposure: Past   Smokeless tobacco: Never  Vaping Use   Vaping Use: Never used  Substance and Sexual Activity   Alcohol use: No   Drug use: No   Sexual activity: Not on file  Other Topics Concern   Not on file  Social History Narrative   Not on file   Social Determinants of Health   Financial Resource Strain: Not on file  Food Insecurity: Not on file  Transportation Needs: Not on file  Physical Activity: Not on file  Stress: Not on file  Social Connections: Not on file   Intimate Partner Violence: Not on file      Review of Systems  Constitutional:  Negative for chills, fatigue and unexpected weight change.  HENT:  Negative for congestion, postnasal drip, rhinorrhea, sneezing and sore throat.   Eyes:  Negative for redness.  Respiratory:  Negative for cough, chest tightness and shortness of breath.   Cardiovascular:  Negative for chest pain and palpitations.  Gastrointestinal:  Negative for abdominal pain, constipation, diarrhea, nausea and vomiting.  Genitourinary:  Negative for dysuria and frequency.  Musculoskeletal:  Negative for arthralgias, back pain, joint swelling and neck pain.  Skin:  Negative for rash.  Neurological: Negative.  Negative for tremors and numbness.  Hematological:  Negative for adenopathy. Does not bruise/bleed easily.  Psychiatric/Behavioral:  Negative for behavioral problems (Depression), sleep disturbance and suicidal ideas. The patient is not nervous/anxious.     Vital Signs: BP 132/68   Pulse 99   Temp 98.3 F (36.8 C)   Resp 16   Ht '5\' 3"'$  (1.6 m)   Wt 164 lb 6.4 oz (74.6 kg)   SpO2 96%   BMI 29.12 kg/m    Physical Exam Vitals reviewed.  Constitutional:      General: She is not in acute distress.    Appearance: Normal appearance. She is not ill-appearing.  HENT:     Head: Normocephalic and atraumatic.  Eyes:     Pupils: Pupils are equal, round, and reactive to light.  Cardiovascular:     Rate and Rhythm: Normal rate and regular rhythm.  Pulmonary:     Effort: Pulmonary effort is normal. No respiratory distress.  Neurological:     Mental Status: She is alert and oriented to person, place, and time.  Psychiatric:        Mood and Affect: Mood normal.        Behavior: Behavior normal.        Assessment/Plan: 1. Polyneuropathy associated with underlying disease ([place]) Stable, Continue as prescribed - gabapentin (NEURONTIN) 600 MG tablet; Take 1 tablet (600 mg total) by mouth 2 (two) times daily.   Dispense: 60 tablet; Refill: 3  2. Situational anxiety Clonazpam helps, temporary increase provided with a refill.  - clonazePAM (KLONOPIN) 1 MG tablet; Take 0.5 tablets (0.5 mg total) by mouth 2 (two) times daily as needed for anxiety.  Dispense: 30 tablet; Refill: 1  3. Encounter for medication adjustment - clonazePAM (KLONOPIN) 1 MG tablet; Take 0.5 tablets (0.5 mg total) by mouth 2 (two) times daily as needed for anxiety.  Dispense: 30 tablet; Refill: 1  4. Medication refill - clonazePAM (KLONOPIN) 1 MG tablet; Take 0.5 tablets (0.5 mg total) by mouth 2 (two) times daily as needed for anxiety.  Dispense: 30 tablet; Refill: 1 - busPIRone (BUSPAR) 10 MG tablet; Take 1 tablet (10 mg total) by mouth 2 (two) times daily.  Dispense: 60 tablet; Refill: 2 - montelukast (SINGULAIR) 10 MG tablet; Take 1 tablet (10 mg total) by mouth at bedtime.  Dispense: 90 tablet; Refill: 1 - traZODone (DESYREL) 150 MG tablet; Take 1 tablet (150 mg total) by mouth at bedtime.  Dispense: 30 tablet; Refill: 2 - gabapentin (NEURONTIN) 600 MG tablet; Take 1 tablet (600 mg total) by mouth 2 (two) times daily.  Dispense: 60 tablet; Refill: 3   General Counseling: Jodelle verbalizes understanding of the findings of todays visit and agrees with plan of treatment. I have discussed any further diagnostic evaluation that may be needed or ordered today. We also reviewed her medications today. she has been encouraged to call the office with any questions or concerns that should arise related to todays visit.    No orders of the defined types were placed in this encounter.   Meds ordered this encounter  Medications   clonazePAM (KLONOPIN) 1 MG tablet    Sig: Take 0.5 tablets (0.5 mg total) by mouth 2 (two) times daily as needed for anxiety.    Dispense:  30 tablet    Refill:  1    Please discontinue all previous orders for clonazepam. Fill this script today   busPIRone (BUSPAR) 10 MG tablet    Sig: Take 1 tablet (10 mg  total) by mouth 2 (two) times daily.    Dispense:  60 tablet    Refill:  2    For future refills   montelukast (SINGULAIR) 10 MG tablet    Sig: Take 1 tablet (10 mg total) by mouth at bedtime.    Dispense:  90 tablet    Refill:  1   traZODone (DESYREL) 150 MG tablet    Sig: Take 1 tablet (150 mg total) by mouth at bedtime.    Dispense:  30 tablet    Refill:  2    For future refills   gabapentin (NEURONTIN) 600 MG tablet    Sig: Take 1 tablet (600 mg total) by mouth 2 (two) times daily.    Dispense:  60 tablet    Refill:  3    Return in about 2 months (around 03/02/2022) for F/U, Rocko Fesperman PCP.   Total time spent:30 Minutes Time spent includes review of chart, medications, test results, and follow up plan with the patient.   [place] Controlled Substance Database was reviewed by me.  This patient was seen by Jonetta Osgood, FNP-C in collaboration with Dr. Clayborn Bigness as a part of collaborative care agreement.   Slayde Brault R. Valetta Fuller, MSN, FNP-C Internal medicine

## 2022-01-07 DIAGNOSIS — K209 Esophagitis, unspecified without bleeding: Secondary | ICD-10-CM | POA: Diagnosis not present

## 2022-01-07 DIAGNOSIS — Z87442 Personal history of urinary calculi: Secondary | ICD-10-CM | POA: Diagnosis not present

## 2022-01-07 DIAGNOSIS — K219 Gastro-esophageal reflux disease without esophagitis: Secondary | ICD-10-CM | POA: Diagnosis not present

## 2022-01-07 DIAGNOSIS — I447 Left bundle-branch block, unspecified: Secondary | ICD-10-CM | POA: Diagnosis not present

## 2022-01-07 DIAGNOSIS — Z79899 Other long term (current) drug therapy: Secondary | ICD-10-CM | POA: Diagnosis not present

## 2022-01-07 DIAGNOSIS — Z09 Encounter for follow-up examination after completed treatment for conditions other than malignant neoplasm: Secondary | ICD-10-CM | POA: Diagnosis not present

## 2022-01-07 DIAGNOSIS — Z881 Allergy status to other antibiotic agents status: Secondary | ICD-10-CM | POA: Diagnosis not present

## 2022-01-07 DIAGNOSIS — K222 Esophageal obstruction: Secondary | ICD-10-CM | POA: Diagnosis not present

## 2022-01-07 DIAGNOSIS — Z888 Allergy status to other drugs, medicaments and biological substances status: Secondary | ICD-10-CM | POA: Diagnosis not present

## 2022-01-07 DIAGNOSIS — G43909 Migraine, unspecified, not intractable, without status migrainosus: Secondary | ICD-10-CM | POA: Diagnosis not present

## 2022-01-07 DIAGNOSIS — Z8719 Personal history of other diseases of the digestive system: Secondary | ICD-10-CM | POA: Diagnosis not present

## 2022-01-07 DIAGNOSIS — Z885 Allergy status to narcotic agent status: Secondary | ICD-10-CM | POA: Diagnosis not present

## 2022-01-07 DIAGNOSIS — K2270 Barrett's esophagus without dysplasia: Secondary | ICD-10-CM | POA: Diagnosis not present

## 2022-01-07 DIAGNOSIS — Z7951 Long term (current) use of inhaled steroids: Secondary | ICD-10-CM | POA: Diagnosis not present

## 2022-01-07 DIAGNOSIS — J449 Chronic obstructive pulmonary disease, unspecified: Secondary | ICD-10-CM | POA: Diagnosis not present

## 2022-01-07 DIAGNOSIS — G629 Polyneuropathy, unspecified: Secondary | ICD-10-CM | POA: Diagnosis not present

## 2022-01-07 DIAGNOSIS — I10 Essential (primary) hypertension: Secondary | ICD-10-CM | POA: Diagnosis not present

## 2022-01-07 DIAGNOSIS — Z882 Allergy status to sulfonamides status: Secondary | ICD-10-CM | POA: Diagnosis not present

## 2022-01-07 DIAGNOSIS — Z9889 Other specified postprocedural states: Secondary | ICD-10-CM | POA: Diagnosis not present

## 2022-01-07 DIAGNOSIS — Z886 Allergy status to analgesic agent status: Secondary | ICD-10-CM | POA: Diagnosis not present

## 2022-01-15 ENCOUNTER — Encounter: Payer: Self-pay | Admitting: Nurse Practitioner

## 2022-01-15 ENCOUNTER — Ambulatory Visit (INDEPENDENT_AMBULATORY_CARE_PROVIDER_SITE_OTHER): Payer: Medicare Other | Admitting: Nurse Practitioner

## 2022-01-15 VITALS — BP 131/68 | HR 83 | Temp 97.3°F | Resp 16 | Ht 63.0 in | Wt 163.4 lb

## 2022-01-15 DIAGNOSIS — R42 Dizziness and giddiness: Secondary | ICD-10-CM | POA: Diagnosis not present

## 2022-01-15 DIAGNOSIS — R7303 Prediabetes: Secondary | ICD-10-CM

## 2022-01-15 LAB — GLUCOSE, POCT (MANUAL RESULT ENTRY): POC Glucose: 111 mg/dl — AB (ref 70–99)

## 2022-01-15 MED ORDER — MECLIZINE HCL 25 MG PO TABS: 12.5000 mg | ORAL_TABLET | Freq: Three times a day (TID) | ORAL | 1 refills | Status: DC | PRN

## 2022-01-15 NOTE — Progress Notes (Signed)
[facility] [place], [place] [address]  Internal MEDICINE  Office Visit Note  Patient Name: Summer Murphy  [id number]  [id number]  Date of Service: 01/15/2022  Chief Complaint  Patient presents with   Acute Visit    Dizzy for 3 days.    Depression   Gastroesophageal Reflux   Hypertension     HPI Summer Murphy presents for an acute sick visit for dizziness Dizzy constantly, some vertigo, no nausea or vomiting Balance is affected, relying on cane more Started 2 days ago Random CBG was 111,  Concern for dehydration, or electrolyte imbalance  Standing  BP 125/75   Current Medication:  Outpatient Encounter Medications as of 01/15/2022  Medication Sig   acetaminophen (TYLENOL) 500 MG tablet Take 2 tablets (1,000 mg total) by mouth every 6 (six) hours.   busPIRone (BUSPAR) 10 MG tablet Take 1 tablet (10 mg total) by mouth 2 (two) times daily.   clonazePAM (KLONOPIN) 1 MG tablet Take 0.5 tablets (0.5 mg total) by mouth 2 (two) times daily as needed for anxiety.   diclofenac Sodium (VOLTAREN) 1 % GEL Apply 4 g topically 4 (four) times daily.   diltiazem (CARTIA XT) 240 MG 24 hr capsule Take 1 capsule (240 mg total) by mouth daily.   DULoxetine (CYMBALTA) 60 MG capsule Take 1 capsule (60 mg total) by mouth at bedtime.   EPINEPHrine 0.3 mg/0.3 mL IJ SOAJ injection Inject 0.3 mg into the muscle as needed for anaphylaxis.   famotidine (PEPCID) 20 MG tablet Take 1 tablet (20 mg total) by mouth 2 (two) times daily.   fluticasone (FLONASE) 50 MCG/ACT nasal spray Place 2 sprays into both nostrils daily.   fluticasone furoate-vilanterol (BREO ELLIPTA) 100-25 MCG/ACT AEPB Inhale 1 puff into the lungs daily.   gabapentin (NEURONTIN) 600 MG tablet Take 1 tablet (600 mg total) by mouth 2 (two) times daily.   hydrOXYzine (ATARAX) 25 MG tablet Take 1-2 tablets (25-50 mg total) by mouth every 6 (six) hours as needed for itching.   Ipratropium-Albuterol (COMBIVENT RESPIMAT)  20-100 MCG/ACT AERS respimat INHALE 1 PUFF BY MOUTH INTO THE LUNGS EVERY 6 HOURS   ipratropium-albuterol (DUONEB) 0.5-2.5 (3) MG/3ML SOLN Take 3 mLs by nebulization every 4 (four) hours as needed (for shortness of breath/wheezing).   loratadine (CLARITIN) 10 MG tablet Take 1 tablet (10 mg total) by mouth daily.   meclizine (ANTIVERT) 25 MG tablet Take 0.5-1 tablets (12.5-25 mg total) by mouth 3 (three) times daily as needed for dizziness.   montelukast (SINGULAIR) 10 MG tablet Take 1 tablet (10 mg total) by mouth at bedtime.   nystatin (MYCOSTATIN) 100000 UNIT/ML suspension 5 cc swish and swallow after each meal and and bed time x 5 days and then once a day as needed   ondansetron (ZOFRAN) 4 MG tablet TAKE 1 TABLET BY MOUTH TWICE DAILY AS NEEDED   pantoprazole (PROTONIX) 40 MG tablet Take 40 mg by mouth 2 (two) times daily.   pilocarpine (SALAGEN) 5 MG tablet Take 5 mg by mouth 2 (two) times daily.   rOPINIRole (REQUIP) 0.5 MG tablet Take two tablets po BID for restless legs.   sennosides-docusate sodium (SENOKOT-S) 8.6-50 MG tablet Take 2 tablets by mouth daily.   SUMAtriptan (IMITREX) 100 MG tablet Take 1 tablet (100 mg total) by mouth every 2 (two) hours as needed (for migraine headaches.). May repeat in 1 hours if headache persists or recurs.   traZODone (DESYREL) 150 MG tablet Take 1 tablet (150 mg  total) by mouth at bedtime.   triamcinolone cream (KENALOG) 0.1 % Apply 1 Application topically 2 (two) times daily. To affected area for itching or until rash resolves.   valACYclovir (VALTREX) 500 MG tablet Take 1 tablet (500 mg total) by mouth daily as needed (for fever blisters).   No facility-administered encounter medications on file as of 01/15/2022.      Medical History: Past Medical History:  Diagnosis Date   Acid reflux    Anemia    Anxiety    Arrhythmia    treated with meds and has no current problems   Arthritis    most uncomfortable in knees   Asthma    uses several  inhalers   Depression    Fever blister    Hematuria    History of kidney stones    Hypertension    Hypoglycemia    Left flank pain    Migraine    Pre-diabetes    Restless leg    Yeast vaginitis      Vital Signs: BP 131/68   Pulse 83   Temp (!) 97.3 F (36.3 C)   Resp 16   Ht $R'5\' 3"'eS$  (1.6 m)   Wt 163 lb 6.4 oz (74.1 kg)   SpO2 95%   BMI 28.95 kg/m    Review of Systems  Constitutional:  Negative for chills, fatigue and unexpected weight change.  HENT:  Negative for congestion, rhinorrhea, sneezing and sore throat.   Eyes:  Negative for redness.  Respiratory:  Negative for cough, chest tightness and shortness of breath.   Cardiovascular: Negative.  Negative for chest pain and palpitations.  Gastrointestinal:  Negative for abdominal pain, constipation, diarrhea, nausea and vomiting.  Genitourinary:  Negative for dysuria and frequency.  Musculoskeletal:  Negative for arthralgias, back pain, joint swelling and neck pain.  Skin:  Negative for rash.  Neurological:  Positive for dizziness and light-headedness. Negative for tremors and numbness.  Hematological:  Negative for adenopathy. Does not bruise/bleed easily.  Psychiatric/Behavioral:  Negative for behavioral problems (Depression), sleep disturbance and suicidal ideas. The patient is not nervous/anxious.     Physical Exam Vitals reviewed.  Constitutional:      General: She is not in acute distress.    Appearance: Normal appearance. She is not ill-appearing.  HENT:     Head: Normocephalic and atraumatic.  Eyes:     Pupils: Pupils are equal, round, and reactive to light.  Cardiovascular:     Rate and Rhythm: Normal rate and regular rhythm.  Pulmonary:     Effort: Pulmonary effort is normal. No respiratory distress.  Neurological:     Mental Status: She is alert and oriented to person, place, and time.  Psychiatric:        Mood and Affect: Mood normal.        Behavior: Behavior normal.        Assessment/Plan: 1. Dizziness, nonspecific Glucose was normal. Meclizine prescribed prn, labs ordered and refer to neuro and ENT - POCT Glucose (CBG) - meclizine (ANTIVERT) 25 MG tablet; Take 0.5-1 tablets (12.5-25 mg total) by mouth 3 (three) times daily as needed for dizziness.  Dispense: 60 tablet; Refill: 1 - CMP14+EGFR - Ambulatory referral to Neurology  2. Prediabetes Repeat labs  - CMP14+EGFR  3. Vertigo Referred to ENT and neuro.  - Ambulatory referral to ENT - Ambulatory referral to Neurology   General Counseling: Summer Murphy verbalizes understanding of the findings of todays visit and agrees with plan of treatment. I have discussed  any further diagnostic evaluation that may be needed or ordered today. We also reviewed her medications today. she has been encouraged to call the office with any questions or concerns that should arise related to todays visit.    Counseling:    Orders Placed This Encounter  Procedures   CMP14+EGFR   POCT Glucose (CBG)    Meds ordered this encounter  Medications   meclizine (ANTIVERT) 25 MG tablet    Sig: Take 0.5-1 tablets (12.5-25 mg total) by mouth 3 (three) times daily as needed for dizziness.    Dispense:  60 tablet    Refill:  1    Return if symptoms worsen or fail to improve.   Controlled Substance Database was reviewed by me for overdose risk score (ORS)  Time spent:30 Minutes Time spent with patient included reviewing progress notes, labs, imaging studies, and discussing plan for follow up.   This patient was seen by Jonetta Osgood, FNP-C in collaboration with Dr. Clayborn Bigness as a part of collaborative care agreement.  Aryiah Monterosso R. Valetta Fuller, MSN, FNP-C Internal Medicine

## 2022-01-16 LAB — CMP14+EGFR
ALT: 30 IU/L (ref 0–32)
AST: 21 IU/L (ref 0–40)
Albumin/Globulin Ratio: 2.2 (ref 1.2–2.2)
Albumin: 4.4 g/dL (ref 3.9–4.9)
Alkaline Phosphatase: 91 IU/L (ref 44–121)
BUN/Creatinine Ratio: 17 (ref 12–28)
BUN: 11 mg/dL (ref 8–27)
Bilirubin Total: 0.2 mg/dL (ref 0.0–1.2)
CO2: 21 mmol/L (ref 20–29)
Calcium: 9.1 mg/dL (ref 8.7–10.3)
Chloride: 104 mmol/L (ref 96–106)
Creatinine, Ser: 0.64 mg/dL (ref 0.57–1.00)
Globulin, Total: 2 g/dL (ref 1.5–4.5)
Glucose: 110 mg/dL — ABNORMAL HIGH (ref 70–99)
Potassium: 4.3 mmol/L (ref 3.5–5.2)
Sodium: 142 mmol/L (ref 134–144)
Total Protein: 6.4 g/dL (ref 6.0–8.5)
eGFR: 97 mL/min/{1.73_m2} (ref >59)

## 2022-01-21 ENCOUNTER — Telehealth: Payer: Self-pay

## 2022-01-21 DIAGNOSIS — S82302D Unspecified fracture of lower end of left tibia, subsequent encounter for closed fracture with routine healing: Secondary | ICD-10-CM | POA: Diagnosis not present

## 2022-01-21 DIAGNOSIS — G5602 Carpal tunnel syndrome, left upper limb: Secondary | ICD-10-CM | POA: Diagnosis not present

## 2022-01-21 DIAGNOSIS — G5622 Lesion of ulnar nerve, left upper limb: Secondary | ICD-10-CM | POA: Diagnosis not present

## 2022-01-26 ENCOUNTER — Telehealth: Payer: Self-pay

## 2022-01-26 NOTE — Telephone Encounter (Signed)
Patient called to report ongoing dizziness and headache. Patient advised to see Neurology, per Alyssa. Alyssa will put in the Neurology referral.

## 2022-01-27 ENCOUNTER — Inpatient Hospital Stay: Payer: Medicare Other | Attending: Oncology

## 2022-01-27 DIAGNOSIS — K5900 Constipation, unspecified: Secondary | ICD-10-CM | POA: Insufficient documentation

## 2022-01-27 DIAGNOSIS — D509 Iron deficiency anemia, unspecified: Secondary | ICD-10-CM | POA: Insufficient documentation

## 2022-01-27 DIAGNOSIS — Z8719 Personal history of other diseases of the digestive system: Secondary | ICD-10-CM | POA: Insufficient documentation

## 2022-01-27 DIAGNOSIS — R42 Dizziness and giddiness: Secondary | ICD-10-CM | POA: Diagnosis not present

## 2022-01-27 DIAGNOSIS — R5383 Other fatigue: Secondary | ICD-10-CM | POA: Insufficient documentation

## 2022-01-27 DIAGNOSIS — D500 Iron deficiency anemia secondary to blood loss (chronic): Secondary | ICD-10-CM

## 2022-01-27 LAB — CBC WITH DIFFERENTIAL/PLATELET
Abs Immature Granulocytes: 0.02 10*3/uL (ref 0.00–0.07)
Basophils Absolute: 0 10*3/uL (ref 0.0–0.1)
Basophils Relative: 1 %
Eosinophils Absolute: 0.2 10*3/uL (ref 0.0–0.5)
Eosinophils Relative: 3 %
HCT: 38.1 % (ref 36.0–46.0)
Hemoglobin: 12.7 g/dL (ref 12.0–15.0)
Immature Granulocytes: 0 %
Lymphocytes Relative: 23 %
Lymphs Abs: 1.9 10*3/uL (ref 0.7–4.0)
MCH: 31.1 pg (ref 26.0–34.0)
MCHC: 33.3 g/dL (ref 30.0–36.0)
MCV: 93.2 fL (ref 80.0–100.0)
Monocytes Absolute: 0.5 10*3/uL (ref 0.1–1.0)
Monocytes Relative: 7 %
Neutro Abs: 5.3 10*3/uL (ref 1.7–7.7)
Neutrophils Relative %: 66 %
Platelets: 249 10*3/uL (ref 150–400)
RBC: 4.09 MIL/uL (ref 3.87–5.11)
RDW: 12.1 % (ref 11.5–15.5)
WBC: 8 10*3/uL (ref 4.0–10.5)
nRBC: 0 % (ref 0.0–0.2)

## 2022-01-27 LAB — IRON AND TIBC
Iron: 66 ug/dL (ref 28–170)
Saturation Ratios: 16 % (ref 10.4–31.8)
TIBC: 407 ug/dL (ref 250–450)
UIBC: 341 ug/dL

## 2022-01-27 LAB — VITAMIN B12: Vitamin B-12: 452 pg/mL (ref 180–914)

## 2022-01-27 LAB — FERRITIN: Ferritin: 28 ng/mL (ref 11–307)

## 2022-01-28 ENCOUNTER — Encounter: Payer: Self-pay | Admitting: Oncology

## 2022-01-28 ENCOUNTER — Inpatient Hospital Stay (HOSPITAL_BASED_OUTPATIENT_CLINIC_OR_DEPARTMENT_OTHER): Payer: Medicare Other | Admitting: Oncology

## 2022-01-28 ENCOUNTER — Inpatient Hospital Stay: Payer: Medicare Other

## 2022-01-28 VITALS — BP 144/60 | HR 101 | Temp 97.5°F | Wt 166.8 lb

## 2022-01-28 DIAGNOSIS — K5900 Constipation, unspecified: Secondary | ICD-10-CM | POA: Diagnosis not present

## 2022-01-28 DIAGNOSIS — D500 Iron deficiency anemia secondary to blood loss (chronic): Secondary | ICD-10-CM | POA: Diagnosis not present

## 2022-01-28 DIAGNOSIS — Z8719 Personal history of other diseases of the digestive system: Secondary | ICD-10-CM | POA: Diagnosis not present

## 2022-01-28 DIAGNOSIS — R42 Dizziness and giddiness: Secondary | ICD-10-CM | POA: Diagnosis not present

## 2022-01-28 DIAGNOSIS — D509 Iron deficiency anemia, unspecified: Secondary | ICD-10-CM | POA: Diagnosis not present

## 2022-01-28 DIAGNOSIS — R5383 Other fatigue: Secondary | ICD-10-CM | POA: Diagnosis not present

## 2022-01-28 NOTE — Progress Notes (Signed)
Hematology/Oncology Progress note [phone] [phone]    Patient Care Team: Jonetta Osgood, NP as PCP - General (Nurse Practitioner) Alena Bills, [facility] as Pharmacist (Pharmacist) Earlie Server, MD as Consulting Physician (Oncology)  ASSESSMENT & PLAN:   IDA (iron deficiency anemia) #Iron deficiency anemia, Labs reviewed and discussed with patient. Hemoglobin remains stable and normal Normal iron panel Hold off IV Venofer treatments.  Continue observation Recommend patient to continue follow up with GI for Barrett esophagus surveillance.   Orders Placed This Encounter  Procedures   CBC with Differential/Platelet    Standing Status:   Future    Standing Expiration Date:   01/29/2023   Comprehensive metabolic panel    Standing Status:   Future    Standing Expiration Date:   01/28/2023   Iron and TIBC    Standing Status:   Future    Standing Expiration Date:   01/29/2023   Ferritin    Standing Status:   Future    Standing Expiration Date:   01/29/2023   Vitamin B12    Standing Status:   Future    Standing Expiration Date:   01/29/2023   Follow up in 1 year.  All questions were answered. The patient knows to call the clinic with any problems, questions or concerns.  Earlie Server, MD, PhD [facility] Hematology Oncology 01/28/2022   CHIEF COMPLAINTS/REASON FOR VISIT:  Follow-up iron deficiency anemia  HISTORY OF PRESENTING ILLNESS:   Summer Murphy is a  66 y.o.  female with PMH listed below was seen in consultation at the request of  Lavera Guise, MD  for evaluation of iron deficiency anemia.  Reviewed patient's previous labs.  Anemia is chronic, date back to August 2021.  09/16/20 TIBC 454, ferritin 12, iron saturation 5,  10/11/20 Hemoglobin 10.5,   Patient history of Barrett's esophagus, has had multiple EGDs.  Recent EGDs listed below.  05/29/2020 upper EGD Barrett's esophagus. Treated with argon plasma  coagulation (APC).   -  Benign-appearing esophageal stenosis. Dilated.  - LA Grade B reflux esophagitis with no bleeding.  - An anti-reflux surgical site was found herniated  above the diaphragm.   07/24/2020 upper EGD showed Barrett's esophagus. Treated with argon plasma coagulation (APC). Benign-appearing esophageal stenosis. Dilated.      - An anti-reflux surgical site was found.   - Large hiatal hernia.   10/02/2020 upper EGD showed There is no endoscopic evidence of Barrett's esophagus. Biopsied. Large hiatal hernia, - An anti-reflux surgical site was found. - A large amount of food (residue) in the stomach.  Stomach high cardia biopsy showed cardio-oxyntic type mucosa with faveolar hyperplasia.  Esophagus biopsy showed squamous esophageal mucosa with chronic esophagitis and increased intraepithelial eosinophils.   She has had colonoscopy in 2018, procedure was aborted due to poor prep.   Patient reports fatigue. Denies any black or bloody stool. She has chronic constipation and can not tolerate oral iron supplementation.   INTERVAL HISTORY Summer Murphy is a 66 y.o. female who has above history reviewed by me today presents for follow up visit for management of iron deficiency anemia. Patient reports feeling well.  Patient tolerated IV Venofer treatments + dizziness with position changes. Not improved with meclizine. Pcp refers her to neurology  Review of Systems  Constitutional:  Positive for fatigue. Negative for appetite change, chills and fever.  HENT:   Negative for hearing loss and voice change.   Eyes:  Negative for eye problems.  Respiratory:  Negative  for chest tightness and cough.   Cardiovascular:  Negative for chest pain.  Gastrointestinal:  Negative for abdominal distention, abdominal pain and blood in stool.  Endocrine: Negative for hot flashes.  Genitourinary:  Negative for difficulty urinating and frequency.   Musculoskeletal:  Negative for arthralgias.  Skin:  Negative for itching and  rash.  Neurological:  Positive for dizziness. Negative for extremity weakness.  Hematological:  Negative for adenopathy.  Psychiatric/Behavioral:  Negative for confusion.     MEDICAL HISTORY:  Past Medical History:  Diagnosis Date   Acid reflux    Anemia    Anxiety    Arrhythmia    treated with meds and has no current problems   Arthritis    most uncomfortable in knees   Asthma    uses several inhalers   Depression    Fever blister    Hematuria    History of kidney stones    Hypertension    Hypoglycemia    Left flank pain    Migraine    Pre-diabetes    Restless leg    Yeast vaginitis     SURGICAL HISTORY: Past Surgical History:  Procedure Laterality Date   ABDOMINAL HYSTERECTOMY     AMPUTATION TOE Left 07/31/2016   Procedure: AMPUTATION TOE/MPJ 2nd toe;  Surgeon: Sharlotte Alamo, DPM;  Location: [facility] ORS;  Service: Podiatry;  Laterality: Left;   APPENDECTOMY  1990   BACK SURGERY     low back   BREAST SURGERY     bilateral breast reduction   CARDIAC ELECTROPHYSIOLOGY STUDY AND ABLATION     CHOLECYSTECTOMY  1990   COLONOSCOPY WITH PROPOFOL N/A 01/04/2017   Procedure: COLONOSCOPY WITH PROPOFOL;  Surgeon: Manya Silvas, MD;  Location: [facility] ENDOSCOPY;  Service: Endoscopy;  Laterality: N/A;   CORNEAL TRANSPLANT     ESOPHAGOGASTRODUODENOSCOPY N/A 04/09/2021   Procedure: ESOPHAGOGASTRODUODENOSCOPY (EGD);  Surgeon: Lin Landsman, MD;  Location: [facility] ENDOSCOPY;  Service: Gastroenterology;  Laterality: N/A;   ESOPHAGOGASTRODUODENOSCOPY (EGD) WITH PROPOFOL N/A 01/04/2017   Procedure: ESOPHAGOGASTRODUODENOSCOPY (EGD) WITH PROPOFOL;  Surgeon: Manya Silvas, MD;  Location: [facility] ENDOSCOPY;  Service: Endoscopy;  Laterality: N/A;   EXCISION BONE CYST Left 07/31/2016   Procedure: EXCISION BONE CYST/exostectomy 28124/left 2nd;  Surgeon: Sharlotte Alamo, DPM;  Location: [facility] ORS;  Service: Podiatry;  Laterality: Left;   EXTRACORPOREAL SHOCK WAVE LITHOTRIPSY Left 09/12/2015    Procedure: EXTRACORPOREAL SHOCK WAVE LITHOTRIPSY (ESWL);  Surgeon: Hollice Espy, MD;  Location: [facility] ORS;  Service: Urology;  Laterality: Left;   FRACTURE SURGERY     left foot   HARDWARE REMOVAL Left 11/14/2020   Procedure: HARDWARE REMOVAL;  Surgeon: Hessie Knows, MD;  Location: [facility] ORS;  Service: Orthopedics;  Laterality: Left;   [place] repair     Fundoplication   JOINT REPLACEMENT Bilateral 2013,2014   total knees   LAPAROSCOPIC HYSTERECTOMY     LITHOTRIPSY     periprosthetic supracondylar fracture of left femur  02/16/2020   [facility]   TONSILLECTOMY     TOTAL KNEE REVISION Right 11/14/2019   Procedure: Revision patella and tibial polyethylene;  Surgeon: Hessie Knows, MD;  Location: [facility] ORS;  Service: Orthopedics;  Laterality: Right;   URETEROSCOPY      SOCIAL HISTORY: Social History   Socioeconomic History   Marital status: Widowed    Spouse name: Not on file   Number of children: Not on file   Years of education: Not on file   Highest education level: Not on file  Occupational  History   Not on file  Tobacco Use   Smoking status: Never    Passive exposure: Past   Smokeless tobacco: Never  Vaping Use   Vaping Use: Never used  Substance and Sexual Activity   Alcohol use: No   Drug use: No   Sexual activity: Not on file  Other Topics Concern   Not on file  Social History Narrative   Not on file   Social Determinants of Health   Financial Resource Strain: Not on file  Food Insecurity: Not on file  Transportation Needs: Not on file  Physical Activity: Not on file  Stress: Not on file  Social Connections: Not on file  Intimate Partner Violence: Not on file    FAMILY HISTORY: Family History  Problem Relation Age of Onset   Hypertension Mother    Stroke Mother    Prostate cancer Father    Kidney Stones Father    Diabetes Brother    Hypertension Brother    Breast cancer Maternal Aunt    Kidney disease Neg Hx     ALLERGIES:  is allergic to  librium [chlordiazepoxide], metoclopramide, vanilla, aspirin, nsaids, azithromycin, buprenorphine hcl, chlordiazepoxide hcl, morphine, sulfa antibiotics, tolmetin, amlodipine besylate, and iron.  MEDICATIONS:  Current Outpatient Medications  Medication Sig Dispense Refill   acetaminophen (TYLENOL) 500 MG tablet Take 2 tablets (1,000 mg total) by mouth every 6 (six) hours. 30 tablet 0   busPIRone (BUSPAR) 10 MG tablet Take 1 tablet (10 mg total) by mouth 2 (two) times daily. 60 tablet 2   clonazePAM (KLONOPIN) 1 MG tablet Take 0.5 tablets (0.5 mg total) by mouth 2 (two) times daily as needed for anxiety. 30 tablet 1   diclofenac Sodium (VOLTAREN) 1 % GEL Apply 4 g topically 4 (four) times daily. 350 g 1   diltiazem (CARTIA XT) 240 MG 24 hr capsule Take 1 capsule (240 mg total) by mouth daily. 90 capsule 3   DULoxetine (CYMBALTA) 60 MG capsule Take 1 capsule (60 mg total) by mouth at bedtime. 90 capsule 1   EPINEPHrine 0.3 mg/0.3 mL IJ SOAJ injection Inject 0.3 mg into the muscle as needed for anaphylaxis. 1 each 3   famotidine (PEPCID) 20 MG tablet Take 1 tablet (20 mg total) by mouth 2 (two) times daily. 60 tablet 3   fluticasone (FLONASE) 50 MCG/ACT nasal spray Place 2 sprays into both nostrils daily. 16 g 6   fluticasone furoate-vilanterol (BREO ELLIPTA) 100-25 MCG/ACT AEPB Inhale 1 puff into the lungs daily. 1 each 11   gabapentin (NEURONTIN) 600 MG tablet Take 1 tablet (600 mg total) by mouth 2 (two) times daily. 60 tablet 3   hydrOXYzine (ATARAX) 25 MG tablet Take 1-2 tablets (25-50 mg total) by mouth every 6 (six) hours as needed for itching. 30 tablet 0   Ipratropium-Albuterol (COMBIVENT RESPIMAT) 20-100 MCG/ACT AERS respimat INHALE 1 PUFF BY MOUTH INTO THE LUNGS EVERY 6 HOURS 4 g 3   ipratropium-albuterol (DUONEB) 0.5-2.5 (3) MG/3ML SOLN Take 3 mLs by nebulization every 4 (four) hours as needed (for shortness of breath/wheezing). 360 mL 6   loratadine (CLARITIN) 10 MG tablet Take 1 tablet  (10 mg total) by mouth daily. 30 tablet 11   meclizine (ANTIVERT) 25 MG tablet Take 0.5-1 tablets (12.5-25 mg total) by mouth 3 (three) times daily as needed for dizziness. 60 tablet 1   montelukast (SINGULAIR) 10 MG tablet Take 1 tablet (10 mg total) by mouth at bedtime. 90 tablet 1   nystatin (  MYCOSTATIN) 100000 UNIT/ML suspension 5 cc swish and swallow after each meal and and bed time x 5 days and then once a day as needed 240 mL 0   ondansetron (ZOFRAN) 4 MG tablet TAKE 1 TABLET BY MOUTH TWICE DAILY AS NEEDED 45 tablet 1   pantoprazole (PROTONIX) 40 MG tablet Take 40 mg by mouth 2 (two) times daily.     pilocarpine (SALAGEN) 5 MG tablet Take 5 mg by mouth 2 (two) times daily.     rOPINIRole (REQUIP) 0.5 MG tablet Take two tablets po BID for restless legs. 360 tablet 1   sennosides-docusate sodium (SENOKOT-S) 8.6-50 MG tablet Take 2 tablets by mouth daily. 30 tablet 1   SUMAtriptan (IMITREX) 100 MG tablet Take 1 tablet (100 mg total) by mouth every 2 (two) hours as needed (for migraine headaches.). May repeat in 1 hours if headache persists or recurs. 10 tablet 3   traZODone (DESYREL) 150 MG tablet Take 1 tablet (150 mg total) by mouth at bedtime. 30 tablet 2   triamcinolone cream (KENALOG) 0.1 % Apply 1 Application topically 2 (two) times daily. To affected area for itching or until rash resolves. 45 g 1   valACYclovir (VALTREX) 500 MG tablet Take 1 tablet (500 mg total) by mouth daily as needed (for fever blisters). 90 tablet 1   No current facility-administered medications for this visit.     PHYSICAL EXAMINATION: ECOG PERFORMANCE STATUS: 1 - Symptomatic but completely ambulatory Vitals:   01/28/22 1456  BP: (!) 144/60  Pulse: (!) 101  Temp: (!) 97.5 F (36.4 C)  SpO2: 96%   Filed Weights   01/28/22 1456  Weight: 166 lb 12.8 oz (75.7 kg)    Physical Exam Constitutional:      General: She is not in acute distress. HENT:     Head: Normocephalic.  Eyes:     General: No  scleral icterus. Cardiovascular:     Rate and Rhythm: Normal rate and regular rhythm.  Pulmonary:     Effort: Pulmonary effort is normal. No respiratory distress.     Breath sounds: No wheezing.  Abdominal:     General: There is no distension.     Palpations: Abdomen is soft.  Musculoskeletal:        General: Normal range of motion.     Cervical back: Normal range of motion.  Skin:    General: Skin is warm and dry.     Findings: No erythema or rash.  Neurological:     Mental Status: She is alert and oriented to person, place, and time. Mental status is at baseline.     Cranial Nerves: No cranial nerve deficit.  Psychiatric:        Mood and Affect: Mood normal.     LABORATORY DATA:  I have reviewed the data as listed    Latest Ref Rng & Units 01/27/2022   12:51 PM 08/29/2021    1:03 PM 07/28/2021    2:19 PM  CBC  WBC 4.0 - 10.5 K/uL 8.0  7.2  7.8   Hemoglobin 12.0 - 15.0 g/dL 12.7  13.4  12.7   Hematocrit 36.0 - 46.0 % 38.1  39.4  37.5   Platelets 150 - 400 K/uL 249  186  206       Latest Ref Rng & Units 01/15/2022   10:42 AM 08/29/2021    1:03 PM 07/28/2021    1:59 PM  CMP  Glucose 70 - 99 mg/dL 110  140  134  BUN 8 - 27 mg/dL '11  14  13   '$ Creatinine 0.57 - 1.00 mg/dL 0.64  0.68  0.67   Sodium 134 - 144 mmol/L 142  141  133   Potassium 3.5 - 5.2 mmol/L 4.3  4.0  3.5   Chloride 96 - 106 mmol/L 104  103  102   CO2 20 - 29 mmol/L '21  23  26   '$ Calcium 8.7 - 10.3 mg/dL 9.1  9.3  8.4   Total Protein 6.0 - 8.5 g/dL 6.4   6.2   Total Bilirubin 0.0 - 1.2 mg/dL 0.2   0.4   Alkaline Phos 44 - 121 IU/L 91   78   AST 0 - 40 IU/L 21   27   ALT 0 - 32 IU/L 30   31     Lab Results  Component Value Date   IRON 66 01/27/2022   TIBC 407 01/27/2022   FERRITIN 28 01/27/2022    RADIOGRAPHIC STUDIES: I have personally reviewed the radiological images as listed and agreed with the findings in the report. NM Bone Scan Limited  Result Date: 12/11/2021 CLINICAL DATA:  Contusion of  lower extremity initial encounter, ran into a shopping cart 6 weeks ago, constant pain since injury, question stress fracture LEFT tibia, pain in lower LEFT leg just above ankle EXAM: NUCLEAR MEDICINE BONE LIMITED TECHNIQUE: Limited imaging of the lower legs and ankles performed bilaterally. RADIOPHARMACEUTICALS:  97.67 millicuries HA-19F MDP by IV COMPARISON:  None available Radiographic correlation: None available FINDINGS: Photopenic defect at RIGHT knee from knee prosthesis, underwent revision in 2021. Mild increased tracer uptake is seen at the distal LEFT tibial diaphysis extending to the metadiaphyseal junction, overall long distance, question bone contusion. No focal area of increased tracer uptake is seen to suggest stress fracture. No abnormal tracer uptake at RIGHT lower extremity. IMPRESSION: Increased tracer uptake at a long segment of the distal LEFT tibial diaphysis favoring bone contusion; recommend correlation with radiographs. Electronically Signed   By: Lavonia Dana M.D.   On: 12/11/2021 17:48       ASSESSMENT & PLAN:  1. Iron deficiency anemia due to chronic blood loss    #Iron deficiency anemia, Labs reviewed and discussed with patient. Hemoglobin is normal at 12.7. Ferritin 28, iron saturation 21.  Hold off IV Venofer treatments.  Continue observation  hypocalcemia, recommend patient to take calcium supplementation  Follow-up in 6 months.  Labs CBC iron ferritin TIBC, B12 prior to MD +/- Venofer. Orders Placed This Encounter  Procedures   CBC with Differential/Platelet    Standing Status:   Future    Standing Expiration Date:   01/29/2023   Comprehensive metabolic panel    Standing Status:   Future    Standing Expiration Date:   01/28/2023   Iron and TIBC    Standing Status:   Future    Standing Expiration Date:   01/29/2023   Ferritin    Standing Status:   Future    Standing Expiration Date:   01/29/2023   Vitamin B12    Standing Status:   Future    Standing  Expiration Date:   01/29/2023    All questions were answered. The patient knows to call the clinic with any problems questions or concerns.  cc Lavera Guise, MD    Earlie Server, MD, PhD Hematology Oncology [place] at [facility]  01/28/2022

## 2022-01-28 NOTE — Progress Notes (Signed)
Patient is here for follow-up. She states that she has recently been dealing with dizziness and was put on meclizine but it has not helped. Her PCP is referring her to neurology.

## 2022-01-28 NOTE — Assessment & Plan Note (Signed)
#  Iron deficiency anemia, Labs reviewed and discussed with patient. Hemoglobin remains stable and normal Normal iron panel Hold off IV Venofer treatments.  Continue observation

## 2022-01-29 ENCOUNTER — Ambulatory Visit: Payer: Medicare Other

## 2022-01-29 ENCOUNTER — Telehealth: Payer: Self-pay

## 2022-01-29 ENCOUNTER — Ambulatory Visit: Payer: Medicare Other | Admitting: Oncology

## 2022-02-03 NOTE — Telephone Encounter (Signed)
Lmom  that we put referral Vivien Rota will call her back

## 2022-02-04 ENCOUNTER — Telehealth: Payer: Self-pay | Admitting: Nurse Practitioner

## 2022-02-04 NOTE — Telephone Encounter (Signed)
Urgent Otolaryngology referral sent via [facility] to [facility]

## 2022-02-10 ENCOUNTER — Telehealth: Payer: Self-pay | Admitting: Nurse Practitioner

## 2022-02-10 NOTE — Telephone Encounter (Signed)
Otolaryngology appointment 02/27/22 with  [facility]

## 2022-02-10 NOTE — Telephone Encounter (Signed)
Neurology referral sent via [facility] to [facility] 

## 2022-02-11 ENCOUNTER — Other Ambulatory Visit: Payer: Self-pay | Admitting: Internal Medicine

## 2022-02-11 DIAGNOSIS — Z760 Encounter for issue of repeat prescription: Secondary | ICD-10-CM

## 2022-02-14 ENCOUNTER — Encounter: Payer: Self-pay | Admitting: Nurse Practitioner

## 2022-02-16 NOTE — Telephone Encounter (Signed)
done

## 2022-02-23 DIAGNOSIS — G5602 Carpal tunnel syndrome, left upper limb: Secondary | ICD-10-CM | POA: Diagnosis not present

## 2022-02-23 DIAGNOSIS — S82302D Unspecified fracture of lower end of left tibia, subsequent encounter for closed fracture with routine healing: Secondary | ICD-10-CM | POA: Diagnosis not present

## 2022-02-24 ENCOUNTER — Telehealth: Payer: Self-pay | Admitting: Nurse Practitioner

## 2022-02-24 NOTE — Telephone Encounter (Signed)
Neurology appointment 02/22/22 @ 10:00 with [facility]

## 2022-02-27 DIAGNOSIS — H8112 Benign paroxysmal vertigo, left ear: Secondary | ICD-10-CM | POA: Diagnosis not present

## 2022-03-01 ENCOUNTER — Encounter: Payer: Self-pay | Admitting: Nurse Practitioner

## 2022-03-02 ENCOUNTER — Ambulatory Visit: Payer: Medicare Other | Admitting: Nurse Practitioner

## 2022-03-04 DIAGNOSIS — R202 Paresthesia of skin: Secondary | ICD-10-CM | POA: Diagnosis not present

## 2022-03-04 DIAGNOSIS — R200 Anesthesia of skin: Secondary | ICD-10-CM | POA: Diagnosis not present

## 2022-03-08 ENCOUNTER — Emergency Department: Payer: Medicare Other

## 2022-03-08 ENCOUNTER — Encounter: Payer: Self-pay | Admitting: Emergency Medicine

## 2022-03-08 ENCOUNTER — Emergency Department
Admission: EM | Admit: 2022-03-08 | Discharge: 2022-03-08 | Disposition: A | Discharge status: Home / Self Care | Payer: Medicare Other | Attending: Emergency Medicine | Admitting: Emergency Medicine

## 2022-03-08 DIAGNOSIS — E1165 Type 2 diabetes mellitus with hyperglycemia: Secondary | ICD-10-CM | POA: Insufficient documentation

## 2022-03-08 DIAGNOSIS — J45901 Unspecified asthma with (acute) exacerbation: Secondary | ICD-10-CM | POA: Diagnosis not present

## 2022-03-08 DIAGNOSIS — I10 Essential (primary) hypertension: Secondary | ICD-10-CM | POA: Diagnosis not present

## 2022-03-08 DIAGNOSIS — K449 Diaphragmatic hernia without obstruction or gangrene: Secondary | ICD-10-CM | POA: Diagnosis not present

## 2022-03-08 DIAGNOSIS — R0602 Shortness of breath: Secondary | ICD-10-CM | POA: Diagnosis not present

## 2022-03-08 DIAGNOSIS — Z7951 Long term (current) use of inhaled steroids: Secondary | ICD-10-CM | POA: Diagnosis not present

## 2022-03-08 DIAGNOSIS — L299 Pruritus, unspecified: Secondary | ICD-10-CM | POA: Diagnosis not present

## 2022-03-08 DIAGNOSIS — J449 Chronic obstructive pulmonary disease, unspecified: Secondary | ICD-10-CM | POA: Diagnosis not present

## 2022-03-08 MED ORDER — IPRATROPIUM-ALBUTEROL 0.5-2.5 (3) MG/3ML IN SOLN
3.0000 mL | Freq: Once | RESPIRATORY_TRACT | Status: AC
  Administered 2022-03-08: 3 mL via RESPIRATORY_TRACT
  Filled 2022-03-08: qty 3

## 2022-03-08 MED ORDER — PREDNISONE 50 MG PO TABS: ORAL_TABLET | ORAL | 0 refills | Status: DC

## 2022-03-08 MED ORDER — DIPHENHYDRAMINE HCL 25 MG PO CAPS
50.0000 mg | ORAL_CAPSULE | Freq: Once | ORAL | Status: AC
  Administered 2022-03-08: 50 mg via ORAL
  Filled 2022-03-08: qty 2

## 2022-03-08 MED ORDER — PREDNISONE 20 MG PO TABS
60.0000 mg | ORAL_TABLET | Freq: Once | ORAL | Status: AC
  Administered 2022-03-08: 60 mg via ORAL
  Filled 2022-03-08: qty 3

## 2022-03-08 NOTE — ED Triage Notes (Signed)
Pt via POV from home. Pt c/o possible allergic reaction. States that it started last night and she think it may have been to pure vanilla but does not remember having it. Pt has been taking benadryl with no relief. Pt is A&Ox4 but looks uncomfortable from the itching.

## 2022-03-08 NOTE — Discharge Instructions (Addendum)
You should take 1 dose of prednisone once daily for the next 5 days to treat your asthma.  Take several puffs of your albuterol inhaler every 4 hours.  For your itching you continue to use Benadryl and you can also try topical hydrocortisone.  If you feel your throat is closing or shortness of breath returns or you develop any new symptoms that are concerning to you please return to the emergency department.

## 2022-03-08 NOTE — ED Provider Notes (Signed)
[facility] Provider Note    Event Date/Time   First MD Initiated Contact with Patient 03/08/22 1356     (approximate)   History   Rash and Allergic Reaction   HPI  Summer Murphy is a 66 y.o. female  with pmh asthma who p/w rash and difficulty breathing.  Patient noticed rash on her bilateral arms yesterday evening.  She has been very itchy since.  She initially came to the emergency department for itching but developed shortness of breath right before coming.  Denies any difficulty swallowing or voice changes denies nausea vomiting or diarrhea.  Denies new exposures.     Past Medical History:  Diagnosis Date   Acid reflux    Anemia    Anxiety    Arrhythmia    treated with meds and has no current problems   Arthritis    most uncomfortable in knees   Asthma    uses several inhalers   Depression    Fever blister    Hematuria    History of kidney stones    Hypertension    Hypoglycemia    Left flank pain    Migraine    Pre-diabetes    Restless leg    Yeast vaginitis     Patient Active Problem List   Diagnosis Date Noted   Hypocalcemia 07/31/2021   Esophageal dysphagia    Benign esophageal stricture    S/P hardware removal 11/14/2020   IDA (iron deficiency anemia) 10/18/2020   Moderate persistent asthma with acute exacerbation 08/21/2020   S/P revision of total knee, right 11/14/2019   Seasonal allergic rhinitis due to pollen 09/20/2019   Callus of foot 08/13/2019   Candidal vaginitis 08/13/2019   Other symptoms and signs involving the nervous system 08/13/2019   Encounter for general adult medical examination with abnormal findings 08/13/2019   Restless leg syndrome 04/30/2019   Type 2 diabetes mellitus with hyperglycemia ([place]) 04/02/2019   Muscle cramps 03/00/9233   Diastolic dysfunction 00/76/2263   SOB (shortness of breath) 12/14/2018   Hiatal hernia with GERD 12/14/2018   Wheezing 12/14/2018   Impaired fasting glucose  12/14/2018   COPD with acute exacerbation ([place]) 09/28/2018   Acute upper respiratory infection 08/25/2018   Recurrent sinusitis 08/15/2018   Prediabetes 08/15/2018   Nonintractable headache 08/15/2018   Dysuria 08/15/2018   Intractable migraine without status migrainosus 04/22/2018   Nausea 04/22/2018   Polyneuropathy associated with underlying disease ([place]) 04/22/2018   Midline low back pain without sciatica 04/22/2018   Routine cervical smear 04/22/2018   Essential hypertension 07/01/2017   Acute bronchitis with asthma 07/01/2017   Allergic rhinitis 07/01/2017   Moderate asthma without complication 33/54/5625   Severe recurrent major depression without psychotic features ([place]) 09/10/2014   COPD (chronic obstructive pulmonary disease) ([place]) 09/10/2014   Calculi, ureter 04/26/2013   Corneal graft malfunction 08/17/2012   Calculus of kidney 63/89/3734   Renal colic 28/76/8115   Urge incontinence 04/26/2012   Diaphragmatic hernia 10/27/2010   Barrett esophagus 07/07/2010     Physical Exam  Triage Vital Signs: ED Triage Vitals  Enc Vitals Group     BP 03/08/22 1222 (!) 140/71     Pulse Rate 03/08/22 1222 (!) 104     Resp 03/08/22 1222 18     Temp 03/08/22 1219 98.4 F (36.9 C)     Temp Source 03/08/22 1219 Oral     SpO2 03/08/22 1222 97 %     Weight 03/08/22 1220  176 lb (79.8 kg)     Height 03/08/22 1220 '5\' 3"'$  (1.6 m)     Head Circumference --      Peak Flow --      Pain Score --      Pain Loc --      Pain Edu? --      Excl. in [place]? --     Most recent vital signs: Vitals:   03/08/22 1348 03/08/22 1644  BP: (!) 134/114 (!) 148/73  Pulse: (!) 101 92  Resp: 20 20  Temp:  98.7 F (37.1 C)  SpO2: 98% 97%     General: Awake, no distress.  CV:  Good peripheral perfusion. Resp:  Tachypneic, no real wheezing but breathing is labored with prolonged expiratory phase Abd:  No distention.  Neuro:             Awake, Alert, Oriented x 3  Other:  No visible rash or  urticaria No facial swelling normal posterior oropharynx no hoarseness or stridor   ED Results / Procedures / Treatments  Labs (all labs ordered are listed, but only abnormal results are displayed) Labs Reviewed - No data to display   EKG     RADIOLOGY I reviewed and interpreted the CXR which does not show any acute cardiopulmonary process    PROCEDURES:  Critical Care performed: No  Procedures   MEDICATIONS ORDERED IN ED: Medications  ipratropium-albuterol (DUONEB) 0.5-2.5 (3) MG/3ML nebulizer solution 3 mL (3 mLs Nebulization Given 03/08/22 1509)  ipratropium-albuterol (DUONEB) 0.5-2.5 (3) MG/3ML nebulizer solution 3 mL (3 mLs Nebulization Given 03/08/22 1510)  ipratropium-albuterol (DUONEB) 0.5-2.5 (3) MG/3ML nebulizer solution 3 mL (3 mLs Nebulization Given 03/08/22 1509)  predniSONE (DELTASONE) tablet 60 mg (60 mg Oral Given 03/08/22 1510)  diphenhydrAMINE (BENADRYL) capsule 50 mg (50 mg Oral Given 03/08/22 1643)     IMPRESSION / MDM / ASSESSMENT AND PLAN / ED COURSE  I reviewed the triage vital signs and the nursing notes.                              Patient's presentation is most consistent with exacerbation of chronic illness.  Differential diagnosis includes, but is not limited to, COPD exacerbation, contact dermatitis, allergic reaction, anaphylaxis  Patient is a 66 year old female presents because of both rash and difficulty breathing.  Rash started last night she says it is on her bilateral arms and is quite itchy.  On exam I really do not appreciate any rash but she is itching.  There is no urticaria.  Apparently she developed shortness of breath just prior to coming to the ED.  Has not had cough or chest pain.  Does have history of asthma.  Patient's breathing is somewhat labored on my evaluation she has a prolonged expiratory phase there is really not much audible wheezing.  She has no nausea vomiting or diarrhea no urticaria no facial swelling no  hoarseness I have low suspicion for anaphylaxis at this time.  I think the pruritus and dyspnea are unrelated at this time we will treat as an asthma exacerbation.  Patient given 3 DuoNeb's and prednisone.  On repeat assessment she looks much improved and is no longer in any respiratory distress.  He does still complain of itching and I continue to not see any rash.  Patient given a dose of Benadryl.  Will prescribe 5 days of prednisone for presumed asthma exacerbation I have recommended she try hydrocortisone  cream and continue to use Benadryl for the itching.  Discussed return for worsening        FINAL CLINICAL IMPRESSION(S) / ED DIAGNOSES   Final diagnoses:  Exacerbation of asthma, unspecified asthma severity, unspecified whether persistent  Pruritus     Rx / DC Orders   ED Discharge Orders          Ordered    predniSONE (DELTASONE) 50 MG tablet        03/08/22 1631             Note:  This document was prepared using Dragon voice recognition software and may include unintentional dictation errors.   Rada Hay, MD 03/08/22 [phone]

## 2022-03-08 NOTE — ED Triage Notes (Signed)
Pt reports still feels like she is having a reaction and its not getting better. VS reassessed

## 2022-03-09 ENCOUNTER — Telehealth: Payer: Self-pay | Admitting: Nurse Practitioner

## 2022-03-09 NOTE — Telephone Encounter (Signed)
Lvm to scheduled ED follow up-Toni

## 2022-03-11 ENCOUNTER — Other Ambulatory Visit: Payer: Self-pay | Admitting: Nurse Practitioner

## 2022-03-11 DIAGNOSIS — Z760 Encounter for issue of repeat prescription: Secondary | ICD-10-CM

## 2022-03-11 DIAGNOSIS — H8112 Benign paroxysmal vertigo, left ear: Secondary | ICD-10-CM | POA: Diagnosis not present

## 2022-03-11 DIAGNOSIS — I10 Essential (primary) hypertension: Secondary | ICD-10-CM

## 2022-03-12 ENCOUNTER — Telehealth: Payer: Self-pay

## 2022-03-12 NOTE — Telephone Encounter (Signed)
     Patient  visit on 12/17  at [place]    Have you been able to follow up with your primary care physician? Not yet   The patient was or was not able to obtain any needed medicine or equipment. Yes   Are there diet recommendations that you are having difficulty following? Na   Patient expresses understanding of discharge instructions and education provided has no other needs at this time.  Yes     [place], [facility], Care Management  [phone] [address], [place], [place] [address] Phone: [phone] Email: [email or web address]

## 2022-03-18 ENCOUNTER — Other Ambulatory Visit: Payer: Self-pay | Admitting: Nurse Practitioner

## 2022-03-18 DIAGNOSIS — I10 Essential (primary) hypertension: Secondary | ICD-10-CM

## 2022-03-18 DIAGNOSIS — Z760 Encounter for issue of repeat prescription: Secondary | ICD-10-CM

## 2022-03-19 IMAGING — CT CT HEAD W/O CM
3 of 4 series · 14 of 47 positions shown, 16 images · non-contrast
Comparison: 03/01/2015

CLINICAL DATA: Dysphagia and headaches for 6 months.

EXAM:
CT HEAD WITHOUT CONTRAST
TECHNIQUE: Contiguous axial images were obtained from the base of the skull
through the vertex without intravenous contrast.

[Series 2: axial st head 5.00 ax · axial · 0.33mm/px · z∈[-586,-482]mm · 8 of 25 slices shown, 10 images]
[im 2/25  brain]
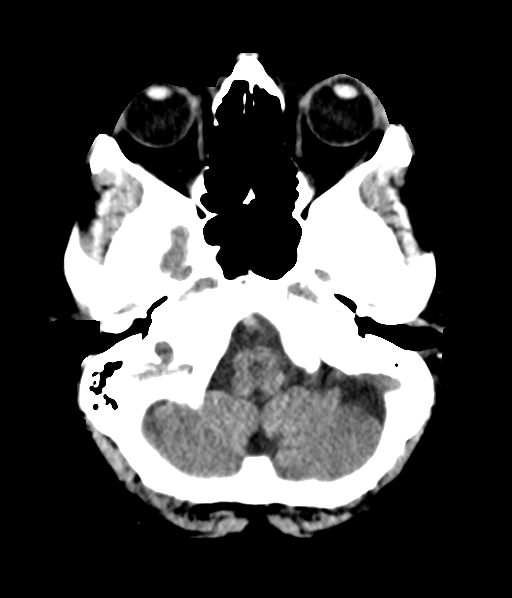
[im 2/25  bone]
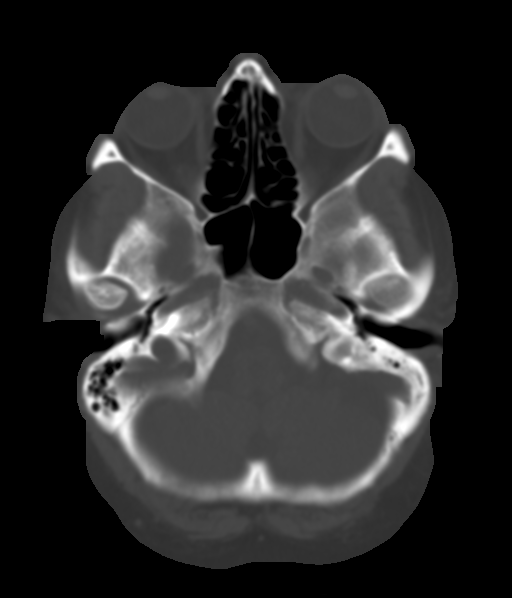
[im 6/25  brain]
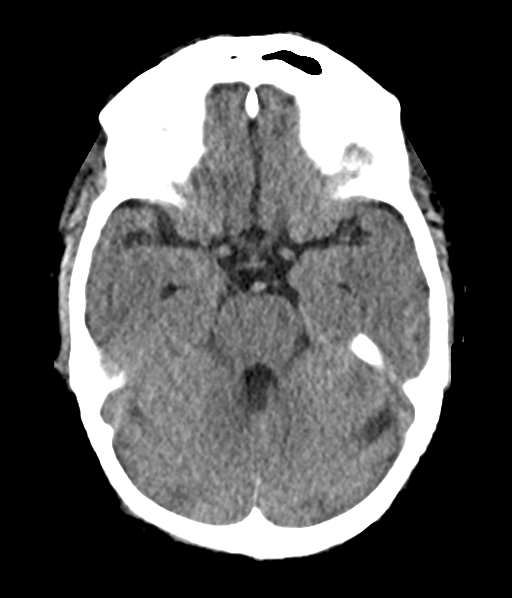
[im 9/25  brain]
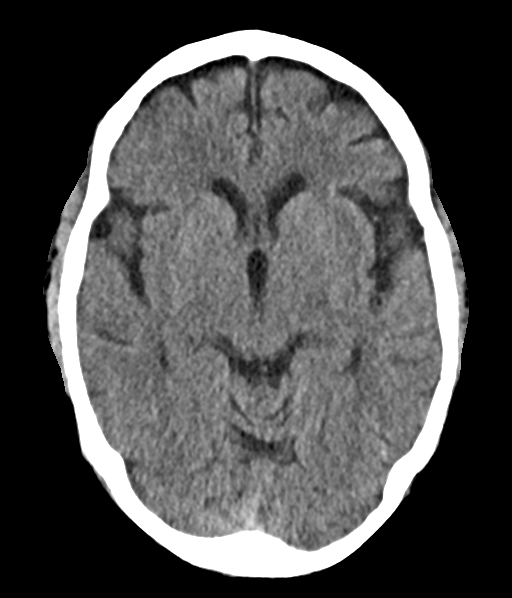
[im 11/25  brain]
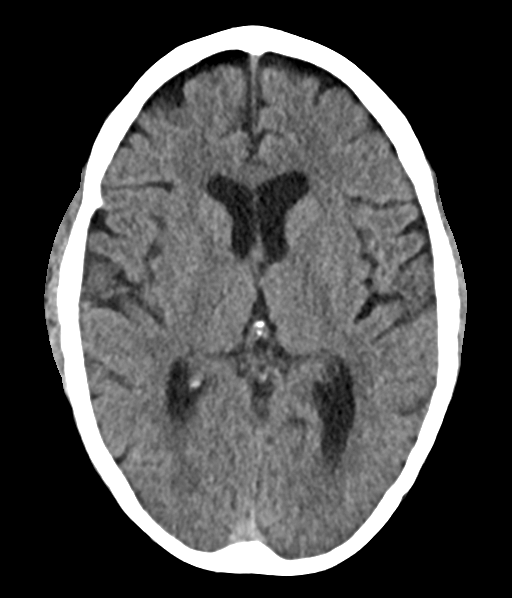
[im 14/25  brain]
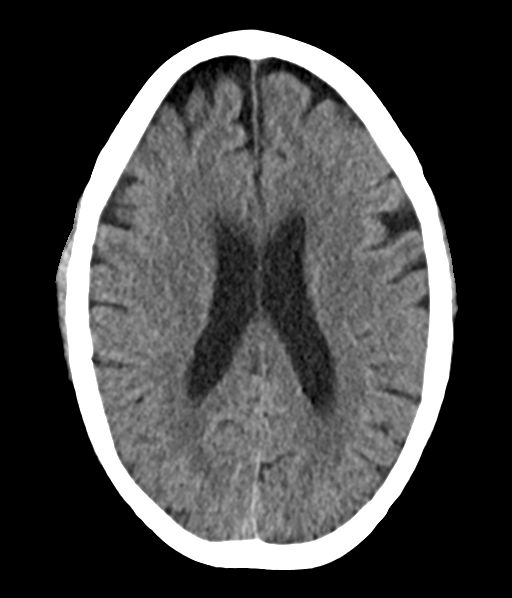
[im 14/25  bone]
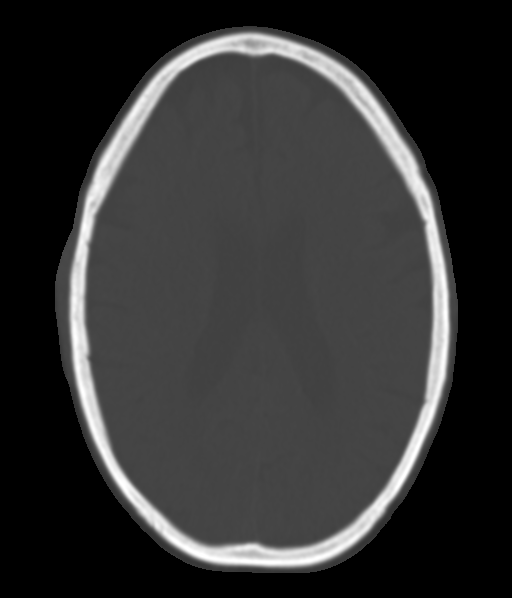
[im 16/25  brain]
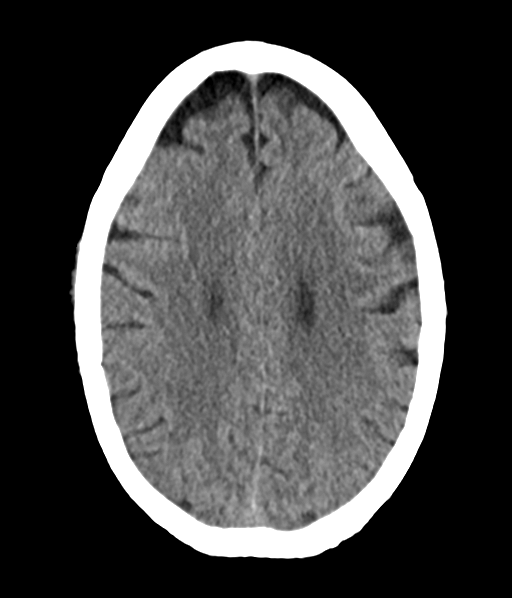
[im 19/25  brain]
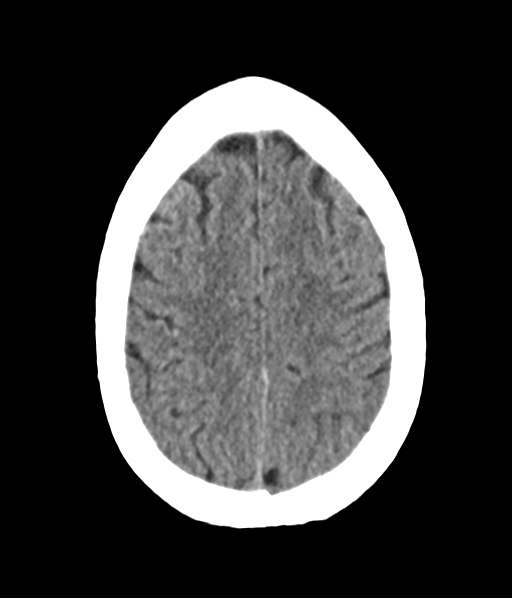
[im 23/25  brain]
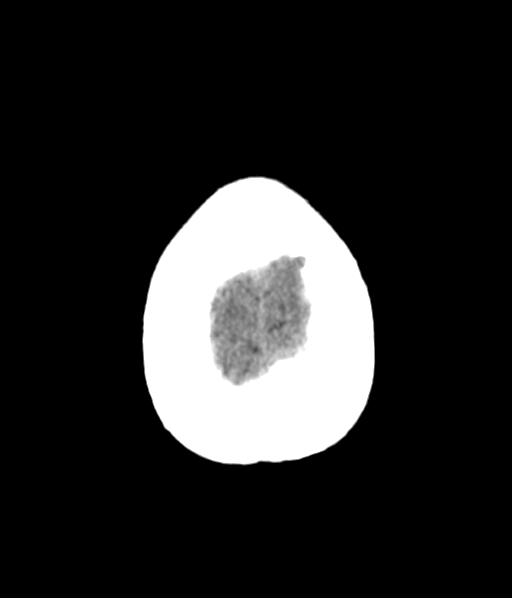

[Series 6: coronals head 3.00 cor · coronal · 0.25mm/px · 3 of 65 slices shown]
[im 22/65  brain]
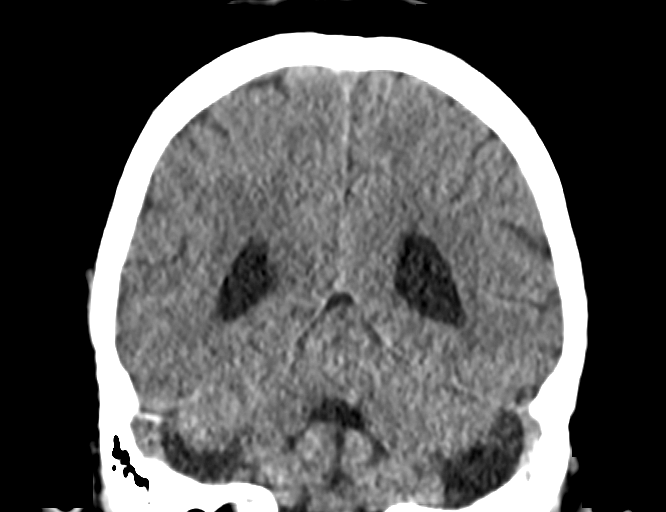
[im 29/65  brain]
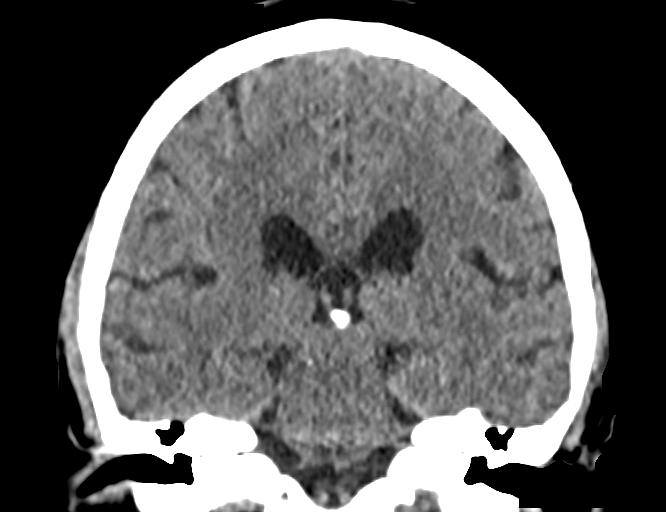
[im 36/65  brain]
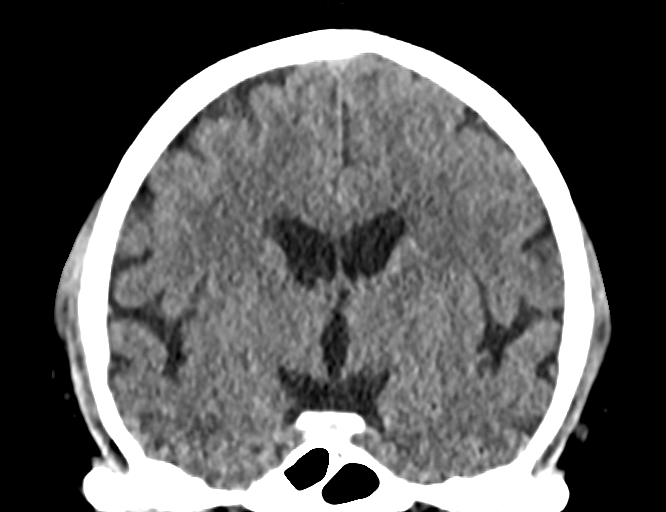

[Series 8: sagittals head 3.00 sag · sagittal · 0.25mm/px · 3 of 55 slices shown]
[im 19/55  brain]
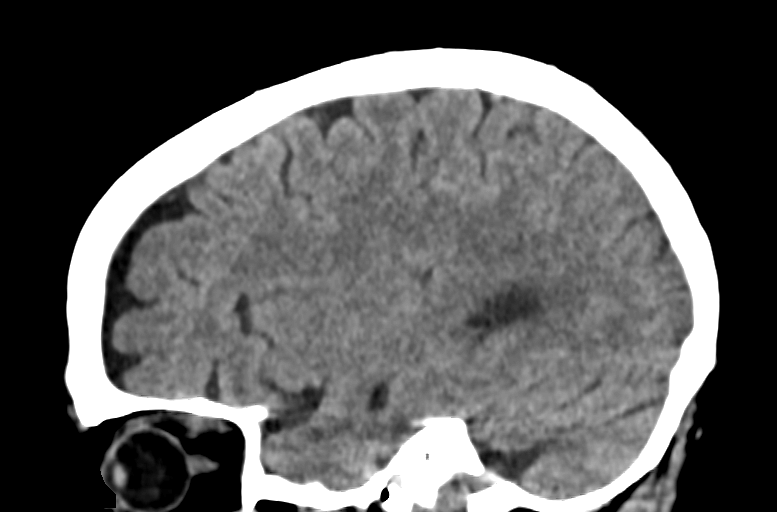
[im 28/55  brain]
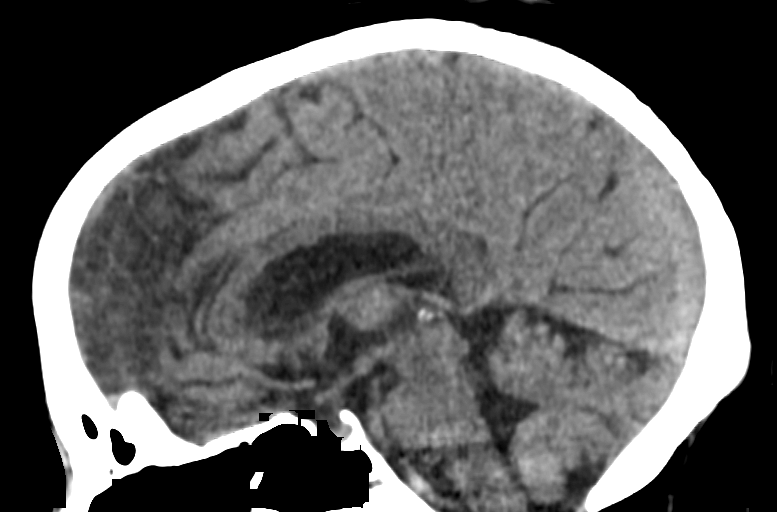
[im 37/55  brain]
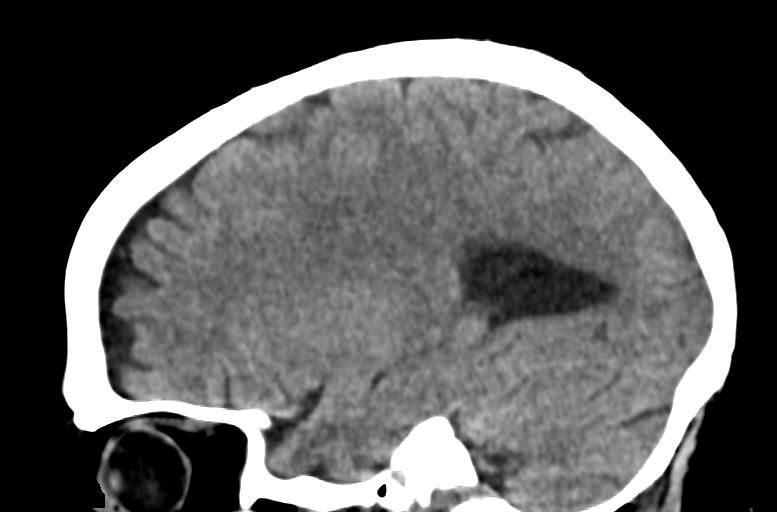

[14 of 47 positions shown; findings below may reference images not displayed]

FINDINGS: Brain: No evidence of acute infarction, hemorrhage, hydrocephalus,
extra-axial collection, or mass lesion/mass effect.

Vascular:  No hyperdense vessel or other acute findings.

Skull: No evidence of fracture or other significant bone
abnormality.

Sinuses/Orbits:  No acute findings.

Other: None.
IMPRESSION: Negative noncontrast head CT.

## 2022-03-24 ENCOUNTER — Ambulatory Visit (INDEPENDENT_AMBULATORY_CARE_PROVIDER_SITE_OTHER): Payer: 59 | Admitting: Nurse Practitioner

## 2022-03-24 ENCOUNTER — Encounter: Payer: Self-pay | Admitting: Oncology

## 2022-03-24 ENCOUNTER — Encounter: Payer: Self-pay | Admitting: Nurse Practitioner

## 2022-03-24 VITALS — BP 133/75 | HR 97 | Temp 96.8°F | Resp 16 | Ht 63.0 in | Wt 162.6 lb

## 2022-03-24 DIAGNOSIS — J4541 Moderate persistent asthma with (acute) exacerbation: Secondary | ICD-10-CM | POA: Diagnosis not present

## 2022-03-24 DIAGNOSIS — R197 Diarrhea, unspecified: Secondary | ICD-10-CM | POA: Diagnosis not present

## 2022-03-24 DIAGNOSIS — Z760 Encounter for issue of repeat prescription: Secondary | ICD-10-CM | POA: Diagnosis not present

## 2022-03-24 MED ORDER — METHYLPREDNISOLONE ACETATE 80 MG/ML IJ SUSP
80.0000 mg | Freq: Once | INTRAMUSCULAR | Status: AC
  Administered 2022-03-24: 40 mg via INTRAMUSCULAR

## 2022-03-24 MED ORDER — PREDNISONE 10 MG PO TABS: ORAL_TABLET | ORAL | 0 refills | Status: DC

## 2022-03-24 MED ORDER — METRONIDAZOLE 500 MG PO TABS: 500.0000 mg | ORAL_TABLET | Freq: Two times a day (BID) | ORAL | 0 refills | Status: DC

## 2022-03-24 MED ORDER — IPRATROPIUM-ALBUTEROL 0.5-2.5 (3) MG/3ML IN SOLN
3.0000 mL | Freq: Once | RESPIRATORY_TRACT | Status: AC
  Administered 2022-03-24: 3 mL via RESPIRATORY_TRACT

## 2022-03-24 MED ORDER — COMBIVENT RESPIMAT 20-100 MCG/ACT IN AERS: INHALATION_SPRAY | RESPIRATORY_TRACT | 3 refills | Status: DC

## 2022-03-24 NOTE — Progress Notes (Signed)
[facility] [employer], [place] [address]  Internal MEDICINE  Office Visit Note  Patient Name: Summer Murphy  [id number]  [id number]  Date of Service: 03/24/2022  Chief Complaint  Patient presents with   Acute Visit    Wheezing and SOB     HPI [name] presents for an acute sick visit for SOB and wheezing, possible asthma exacerbation per patient as she has had this happen before.  Started last Thursday with increased SOB, chest tightness and wheezing.  Denies any fever, chills, cough, or fatigue.  Today she is breathing shallow and is having difficulty pulling air in.    Current Medication:  Outpatient Encounter Medications as of 03/24/2022  Medication Sig   acetaminophen (TYLENOL) 500 MG tablet Take 2 tablets (1,000 mg total) by mouth every 6 (six) hours.   busPIRone (BUSPAR) 10 MG tablet Take 1 tablet (10 mg total) by mouth 2 (two) times daily.   clonazePAM (KLONOPIN) 1 MG tablet Take 0.5 tablets (0.5 mg total) by mouth 2 (two) times daily as needed for anxiety.   diclofenac Sodium (VOLTAREN) 1 % GEL Apply 4 g topically 4 (four) times daily.   diltiazem (CARTIA XT) 240 MG 24 hr capsule Take 1 capsule (240 mg total) by mouth daily.   DULoxetine (CYMBALTA) 60 MG capsule Take 1 capsule (60 mg total) by mouth at bedtime.   EPINEPHrine 0.3 mg/0.3 mL IJ SOAJ injection Inject 0.3 mg into the muscle as needed for anaphylaxis.   famotidine (PEPCID) 20 MG tablet Take 1 tablet (20 mg total) by mouth 2 (two) times daily.   fluticasone (FLONASE) 50 MCG/ACT nasal spray Place 2 sprays into both nostrils daily.   fluticasone furoate-vilanterol (BREO ELLIPTA) 100-25 MCG/ACT AEPB Inhale 1 puff into the lungs daily.   gabapentin (NEURONTIN) 600 MG tablet Take 1 tablet (600 mg total) by mouth 2 (two) times daily.   hydrOXYzine (ATARAX) 25 MG tablet Take 1-2 tablets (25-50 mg total) by mouth every 6 (six) hours as needed for itching.   ipratropium-albuterol (DUONEB) 0.5-2.5  (3) MG/3ML SOLN Take 3 mLs by nebulization every 4 (four) hours as needed (for shortness of breath/wheezing).   loratadine (CLARITIN) 10 MG tablet Take 1 tablet (10 mg total) by mouth daily.   meclizine (ANTIVERT) 25 MG tablet Take 0.5-1 tablets (12.5-25 mg total) by mouth 3 (three) times daily as needed for dizziness.   metroNIDAZOLE (FLAGYL) 500 MG tablet Take 1 tablet (500 mg total) by mouth 2 (two) times daily.   montelukast (SINGULAIR) 10 MG tablet Take 1 tablet (10 mg total) by mouth at bedtime.   nystatin (MYCOSTATIN) 100000 UNIT/ML suspension 5 cc swish and swallow after each meal and and bed time x 5 days and then once a day as needed   ondansetron (ZOFRAN) 4 MG tablet TAKE 1 TABLET BY MOUTH TWICE DAILY AS NEEDED   pantoprazole (PROTONIX) 40 MG tablet Take 40 mg by mouth 2 (two) times daily.   pilocarpine (SALAGEN) 5 MG tablet Take 5 mg by mouth 2 (two) times daily.   predniSONE (DELTASONE) 10 MG tablet Take one tab 3 x day for 3 days, then take one tab 2 x a day for 3 days and then take one tab a day for 3 days for copd   rOPINIRole (REQUIP) 0.5 MG tablet Take two tablets po BID for restless legs.   sennosides-docusate sodium (SENOKOT-S) 8.6-50 MG tablet Take 2 tablets by mouth daily.   SUMAtriptan (IMITREX) 100 MG tablet Take  1 tablet (100 mg total) by mouth every 2 (two) hours as needed (for migraine headaches.). May repeat in 1 hours if headache persists or recurs.   traZODone (DESYREL) 150 MG tablet Take 1 tablet (150 mg total) by mouth at bedtime.   triamcinolone cream (KENALOG) 0.1 % Apply 1 Application topically 2 (two) times daily. To affected area for itching or until rash resolves.   valACYclovir (VALTREX) 500 MG tablet Take 1 tablet (500 mg total) by mouth daily as needed (for fever blisters).   [DISCONTINUED] Ipratropium-Albuterol (COMBIVENT RESPIMAT) 20-100 MCG/ACT AERS respimat INHALE 1 PUFF INTO THE LUNGS EVERY 6 HOURS   [DISCONTINUED] predniSONE (DELTASONE) 50 MG tablet  Take 1 pill daily for the next 5 days   Ipratropium-Albuterol (COMBIVENT RESPIMAT) 20-100 MCG/ACT AERS respimat INHALE 1 PUFF INTO THE LUNGS EVERY 6 HOURS   [EXPIRED] ipratropium-albuterol (DUONEB) 0.5-2.5 (3) MG/3ML nebulizer solution 3 mL    [EXPIRED] methylPREDNISolone acetate (DEPO-MEDROL) injection 80 mg    No facility-administered encounter medications on file as of 03/24/2022.      Medical History: Past Medical History:  Diagnosis Date   Acid reflux    Anemia    Anxiety    Arrhythmia    treated with meds and has no current problems   Arthritis    most uncomfortable in knees   Asthma    uses several inhalers   Depression    Fever blister    Hematuria    History of kidney stones    Hypertension    Hypoglycemia    Left flank pain    Migraine    Pre-diabetes    Restless leg    Yeast vaginitis      Vital Signs: BP 133/75   Pulse 97   Temp (!) 96.8 F (36 C)   Resp 16   Ht '5\' 3"'$  (1.6 m)   Wt 162 lb 9.6 oz (73.8 kg)   SpO2 94%   BMI 28.80 kg/m    Review of Systems  Constitutional:  Positive for fatigue. Negative for chills and fever.  HENT:  Negative for congestion, postnasal drip, rhinorrhea, sinus pressure, sinus pain, sneezing, sore throat and trouble swallowing.   Eyes: Negative.   Respiratory:  Positive for chest tightness, shortness of breath and wheezing. Negative for cough.   Cardiovascular: Negative.  Negative for chest pain and palpitations.  Gastrointestinal:  Positive for diarrhea.  Musculoskeletal: Negative.  Negative for myalgias.  Neurological: Negative.  Negative for headaches.    Physical Exam Vitals reviewed.  Constitutional:      General: She is in acute distress.     Appearance: Normal appearance. She is ill-appearing.  HENT:     Head: Normocephalic and atraumatic.     Right Ear: Tympanic membrane, ear canal and external ear normal. There is no impacted cerumen.     Left Ear: Tympanic membrane, ear canal and external ear normal.  There is no impacted cerumen.     Nose: No congestion or rhinorrhea.     Mouth/Throat:     Mouth: Mucous membranes are moist.     Pharynx: Oropharynx is clear. No oropharyngeal exudate or posterior oropharyngeal erythema.  Eyes:     Pupils: Pupils are equal, round, and reactive to light.  Cardiovascular:     Rate and Rhythm: Regular rhythm. Tachycardia present.     Heart sounds: Normal heart sounds. No murmur heard. Pulmonary:     Effort: Tachypnea, accessory muscle usage and respiratory distress (mild) present.  Breath sounds: Decreased air movement present. Examination of the right-upper field reveals wheezing. Examination of the left-upper field reveals wheezing. Examination of the right-middle field reveals decreased breath sounds and wheezing. Examination of the left-middle field reveals decreased breath sounds and wheezing. Examination of the right-lower field reveals decreased breath sounds. Examination of the left-lower field reveals decreased breath sounds. Decreased breath sounds and wheezing present.     Comments: During today's visit, patient received IM injection of methylprednisolone and a duoneb nebulizer treatment. After the injection and the neb treatment, her breathing calmed down, wheezing decreased and air movement improved. Work of breathing returned to normal, breaths are no longer shallow and she is not using accessory muscles with respirations. She is no longer in any respiratory distress.  Neurological:     Mental Status: She is alert and oriented to person, place, and time.  Psychiatric:        Mood and Affect: Mood normal.        Behavior: Behavior normal.       Assessment/Plan: 1. Moderate persistent asthma with acute exacerbation IM depo medrol administered in office today to decrease inflammation and swelling and improve respiratory status. Oral prednisone taper prescribed to start tomorrow morning. Duoneb nebulizer treatment administered in office today  with significant improvement in respiratory status and stabilization of breathing. She will continue the prednisone taper tomorrow. Patient is aware of warning signs that would warrant a trip to the ER.  - methylPREDNISolone acetate (DEPO-MEDROL) injection 80 mg - predniSONE (DELTASONE) 10 MG tablet; Take one tab 3 x day for 3 days, then take one tab 2 x a day for 3 days and then take one tab a day for 3 days for copd  Dispense: 18 tablet; Refill: 0 - ipratropium-albuterol (DUONEB) 0.5-2.5 (3) MG/3ML nebulizer solution 3 mL  2. Diarrhea, unspecified type Persistent diarrhea for several days with a possible infectious origin. Metronidazole prescribed. If no improvement, will try xifaxan.  - metroNIDAZOLE (FLAGYL) 500 MG tablet; Take 1 tablet (500 mg total) by mouth 2 (two) times daily.  Dispense: 14 tablet; Refill: 0  3. Medication refill - Ipratropium-Albuterol (COMBIVENT RESPIMAT) 20-100 MCG/ACT AERS respimat; INHALE 1 PUFF INTO THE LUNGS EVERY 6 HOURS  Dispense: 4 g; Refill: 3   General Counseling: Amaranta verbalizes understanding of the findings of todays visit and agrees with plan of treatment. I have discussed any further diagnostic evaluation that may be needed or ordered today. We also reviewed her medications today. she has been encouraged to call the office with any questions or concerns that should arise related to todays visit.    Counseling:    No orders of the defined types were placed in this encounter.   Meds ordered this encounter  Medications   methylPREDNISolone acetate (DEPO-MEDROL) injection 80 mg   predniSONE (DELTASONE) 10 MG tablet    Sig: Take one tab 3 x day for 3 days, then take one tab 2 x a day for 3 days and then take one tab a day for 3 days for copd    Dispense:  18 tablet    Refill:  0   Ipratropium-Albuterol (COMBIVENT RESPIMAT) 20-100 MCG/ACT AERS respimat    Sig: INHALE 1 PUFF INTO THE LUNGS EVERY 6 HOURS    Dispense:  4 g    Refill:  3    ipratropium-albuterol (DUONEB) 0.5-2.5 (3) MG/3ML nebulizer solution 3 mL   metroNIDAZOLE (FLAGYL) 500 MG tablet    Sig: Take 1 tablet (500 mg total) by  mouth 2 (two) times daily.    Dispense:  14 tablet    Refill:  0    Return if symptoms worsen or fail to improve, for and otherwise as regular scheduled follow up visit .  [place] Controlled Substance Database was reviewed by me for overdose risk score (ORS)  Time spent:30 Minutes Time spent with patient included reviewing progress notes, labs, imaging studies, and discussing plan for follow up.   This patient was seen by Jonetta Osgood, FNP-C in collaboration with Dr. Clayborn Bigness as a part of collaborative care agreement.  Brizeyda Holtmeyer R. Valetta Fuller, MSN, FNP-C Internal Medicine

## 2022-03-30 ENCOUNTER — Telehealth: Payer: Self-pay | Admitting: Nurse Practitioner

## 2022-03-30 DIAGNOSIS — J4541 Moderate persistent asthma with (acute) exacerbation: Secondary | ICD-10-CM

## 2022-03-30 NOTE — Telephone Encounter (Signed)
error 

## 2022-03-31 ENCOUNTER — Telehealth: Payer: Self-pay | Admitting: Nurse Practitioner

## 2022-03-31 MED ORDER — PREDNISONE 10 MG PO TABS: ORAL_TABLET | ORAL | 0 refills | Status: DC

## 2022-03-31 NOTE — Telephone Encounter (Signed)
Pt advised we sent med

## 2022-03-31 NOTE — Addendum Note (Signed)
Addended by: Jonetta Osgood on: 03/31/2022 01:06 PM   Modules accepted: Orders

## 2022-03-31 NOTE — Telephone Encounter (Signed)
Patient called again regarding prednisone. She stated she called 3 times. Per cma, request has been sent to provider and someone will call her back-Toni

## 2022-04-05 ENCOUNTER — Other Ambulatory Visit: Payer: Self-pay | Admitting: Nurse Practitioner

## 2022-04-05 DIAGNOSIS — J4541 Moderate persistent asthma with (acute) exacerbation: Secondary | ICD-10-CM

## 2022-04-05 DIAGNOSIS — Z760 Encounter for issue of repeat prescription: Secondary | ICD-10-CM

## 2022-04-09 ENCOUNTER — Encounter: Payer: Self-pay | Admitting: Oncology

## 2022-04-09 ENCOUNTER — Encounter: Payer: Self-pay | Admitting: Nurse Practitioner

## 2022-04-09 ENCOUNTER — Ambulatory Visit (INDEPENDENT_AMBULATORY_CARE_PROVIDER_SITE_OTHER): Payer: 59 | Admitting: Nurse Practitioner

## 2022-04-09 ENCOUNTER — Ambulatory Visit
Admission: RE | Admit: 2022-04-09 | Discharge: 2022-04-09 | Disposition: A | Discharge status: Home / Self Care | Payer: 59 | Source: Ambulatory Visit | Attending: Nurse Practitioner | Admitting: Nurse Practitioner

## 2022-04-09 ENCOUNTER — Ambulatory Visit: Payer: 59 | Admitting: Nurse Practitioner

## 2022-04-09 ENCOUNTER — Ambulatory Visit
Admission: RE | Admit: 2022-04-09 | Discharge: 2022-04-09 | Disposition: A | Discharge status: Home / Self Care | Payer: 59 | Attending: Nurse Practitioner | Admitting: Nurse Practitioner

## 2022-04-09 VITALS — BP 125/62 | HR 103 | Temp 97.0°F | Resp 16 | Ht 63.0 in | Wt 164.6 lb

## 2022-04-09 DIAGNOSIS — J4541 Moderate persistent asthma with (acute) exacerbation: Secondary | ICD-10-CM

## 2022-04-09 MED ORDER — PREDNISONE 10 MG PO TABS: ORAL_TABLET | ORAL | 0 refills | Status: DC

## 2022-04-09 NOTE — Progress Notes (Signed)
[facility] [place], [place] [address]  Internal MEDICINE  Office Visit Note  Patient Name: Summer Murphy  [id number]  [id number]  Date of Service: 04/09/2022  Chief Complaint  Patient presents with   Acute Visit     HPI Summer Murphy presents for an acute sick visit for continued shortness of breath --no wheezing -- having chest tightness, difficulty breathing in fully and breathing out fully. -- having to take breaths after 5-7 words.  -- using accessory muscles but not in acute distress.  --did improve after breathing tx at last visit and steroid injection but then her breathing became worse again.     Current Medication:  Outpatient Encounter Medications as of 04/09/2022  Medication Sig   acetaminophen (TYLENOL) 500 MG tablet Take 2 tablets (1,000 mg total) by mouth every 6 (six) hours.   busPIRone (BUSPAR) 10 MG tablet Take 1 tablet (10 mg total) by mouth 2 (two) times daily.   clonazePAM (KLONOPIN) 1 MG tablet Take 0.5 tablets (0.5 mg total) by mouth 2 (two) times daily as needed for anxiety.   diclofenac Sodium (VOLTAREN) 1 % GEL Apply 4 g topically 4 (four) times daily.   diltiazem (CARTIA XT) 240 MG 24 hr capsule Take 1 capsule (240 mg total) by mouth daily.   DULoxetine (CYMBALTA) 60 MG capsule Take 1 capsule (60 mg total) by mouth at bedtime.   EPINEPHrine 0.3 mg/0.3 mL IJ SOAJ injection Inject 0.3 mg into the muscle as needed for anaphylaxis.   famotidine (PEPCID) 20 MG tablet Take 1 tablet (20 mg total) by mouth 2 (two) times daily.   fluticasone (FLONASE) 50 MCG/ACT nasal spray Place 2 sprays into both nostrils daily.   fluticasone furoate-vilanterol (BREO ELLIPTA) 100-25 MCG/ACT AEPB Inhale 1 puff into the lungs daily.   gabapentin (NEURONTIN) 600 MG tablet Take 1 tablet (600 mg total) by mouth 2 (two) times daily.   hydrOXYzine (ATARAX) 25 MG tablet Take 1-2 tablets (25-50 mg total) by mouth every 6 (six) hours as needed for itching.    Ipratropium-Albuterol (COMBIVENT RESPIMAT) 20-100 MCG/ACT AERS respimat INHALE 1 PUFF INTO THE LUNGS EVERY 6 HOURS   ipratropium-albuterol (DUONEB) 0.5-2.5 (3) MG/3ML SOLN Take 3 mLs by nebulization every 4 (four) hours as needed (for shortness of breath/wheezing).   loratadine (CLARITIN) 10 MG tablet Take 1 tablet (10 mg total) by mouth daily.   meclizine (ANTIVERT) 25 MG tablet Take 0.5-1 tablets (12.5-25 mg total) by mouth 3 (three) times daily as needed for dizziness.   metroNIDAZOLE (FLAGYL) 500 MG tablet Take 1 tablet (500 mg total) by mouth 2 (two) times daily.   montelukast (SINGULAIR) 10 MG tablet Take 1 tablet (10 mg total) by mouth at bedtime.   nystatin (MYCOSTATIN) 100000 UNIT/ML suspension 5 cc swish and swallow after each meal and and bed time x 5 days and then once a day as needed   ondansetron (ZOFRAN) 4 MG tablet TAKE 1 TABLET BY MOUTH TWICE DAILY AS NEEDED   pantoprazole (PROTONIX) 40 MG tablet Take 40 mg by mouth 2 (two) times daily.   pilocarpine (SALAGEN) 5 MG tablet Take 5 mg by mouth 2 (two) times daily.   rOPINIRole (REQUIP) 0.5 MG tablet Take two tablets po BID for restless legs.   sennosides-docusate sodium (SENOKOT-S) 8.6-50 MG tablet Take 2 tablets by mouth daily.   SUMAtriptan (IMITREX) 100 MG tablet Take 1 tablet (100 mg total) by mouth every 2 (two) hours as needed (for migraine headaches.). May repeat  in 1 hours if headache persists or recurs.   traZODone (DESYREL) 150 MG tablet TAKE 1 TABLET(150 MG) BY MOUTH AT BEDTIME   triamcinolone cream (KENALOG) 0.1 % Apply 1 Application topically 2 (two) times daily. To affected area for itching or until rash resolves.   valACYclovir (VALTREX) 500 MG tablet Take 1 tablet (500 mg total) by mouth daily as needed (for fever blisters).   [DISCONTINUED] predniSONE (DELTASONE) 10 MG tablet Take one tab 3 x day for 3 days, then take one tab 2 x a day for 3 days and then take one tab a day for 3 days for copd   [DISCONTINUED]  predniSONE (DELTASONE) 10 MG tablet Take 60 mg PO (ORAL) x 2 days 50 mg PO (ORAL)  x 2 days 40 mg PO (ORAL)  x 2 days 30 mg PO  (ORAL)  x 2 days 20 mg PO  (ORAL) x 2 days 10 mg PO  (ORAL) x 2 days then stop   predniSONE (DELTASONE) 10 MG tablet Take 60 mg PO (ORAL) x 2 days 50 mg PO (ORAL)  x 2 days 40 mg PO (ORAL)  x 2 days 30 mg PO  (ORAL)  x 2 days 20 mg PO  (ORAL) x 2 days 10 mg PO  (ORAL) x 2 days then stop   [DISCONTINUED] predniSONE (DELTASONE) 10 MG tablet Take 60 mg PO (ORAL) x 2 days 50 mg PO (ORAL)  x 2 days 40 mg PO (ORAL)  x 2 days 30 mg PO  (ORAL)  x 2 days 20 mg PO  (ORAL) x 2 days 10 mg PO  (ORAL) x 2 days then stop   [DISCONTINUED] predniSONE (DELTASONE) 10 MG tablet Take 60 mg PO (ORAL) x 2 days 50 mg PO (ORAL)  x 2 days 40 mg PO (ORAL)  x 2 days 30 mg PO  (ORAL)  x 2 days 20 mg PO  (ORAL) x 2 days 10 mg PO  (ORAL) x 2 days then stop   [DISCONTINUED] predniSONE (DELTASONE) 10 MG tablet Take 60 mg PO (ORAL) x 2 days 50 mg PO (ORAL)  x 2 days 40 mg PO (ORAL)  x 2 days 30 mg PO  (ORAL)  x 2 days 20 mg PO  (ORAL) x 2 days 10 mg PO  (ORAL) x 2 days then stop   No facility-administered encounter medications on file as of 04/09/2022.      Medical History: Past Medical History:  Diagnosis Date   Acid reflux    Anemia    Anxiety    Arrhythmia    treated with meds and has no current problems   Arthritis    most uncomfortable in knees   Asthma    uses several inhalers   Depression    Fever blister    Hematuria    History of kidney stones    Hypertension    Hypoglycemia    Left flank pain    Migraine    Pre-diabetes    Restless leg    Yeast vaginitis      Vital Signs: BP 125/62   Pulse (!) 103   Temp (!) 97 F (36.1 C)   Resp 16   Ht '5\' 3"'$  (1.6 m)   Wt 164 lb 9.6 oz (74.7 kg)   SpO2 94%   BMI 29.16 kg/m    Review of Systems  Constitutional:  Positive for fatigue.  HENT: Negative.    Respiratory:  Positive for chest tightness and  shortness of breath. Negative  for cough and wheezing.   Cardiovascular: Negative.  Negative for chest pain and palpitations.    Physical Exam Constitutional:      General: She is not in acute distress.    Appearance: Normal appearance. She is ill-appearing.  HENT:     Head: Normocephalic and atraumatic.  Eyes:     Pupils: Pupils are equal, round, and reactive to light.  Cardiovascular:     Rate and Rhythm: Regular rhythm. Tachycardia present.  Pulmonary:     Effort: Tachypnea and accessory muscle usage present. No respiratory distress.     Breath sounds: Decreased air movement present. No stridor. Examination of the right-upper field reveals decreased breath sounds. Examination of the left-upper field reveals decreased breath sounds. Examination of the right-middle field reveals decreased breath sounds. Examination of the left-middle field reveals decreased breath sounds. Examination of the right-lower field reveals decreased breath sounds. Examination of the left-lower field reveals decreased breath sounds. Decreased breath sounds present. No wheezing, rhonchi or rales.  Neurological:     Mental Status: She is alert and oriented to person, place, and time.  Psychiatric:        Mood and Affect: Mood is anxious.        Behavior: Behavior normal.       Assessment/Plan: 1. Moderate persistent asthma with acute exacerbation Chest xray ordered to rule out any acute abnormalities. A longer prednisone taper prescribed. Also patient instructed to continue prn nebulizer treatments and use rescue inhaler if needed. Patient acknowledged that she will go to ER or call 911 if she has acute difficulty breathing as discussed and may call the office if she is not improving after the next 2-3 days.  - DG Chest 2 View; Future - predniSONE (DELTASONE) 10 MG tablet; Take 60 mg PO (ORAL) x 2 days 50 mg PO (ORAL)  x 2 days 40 mg PO (ORAL)  x 2 days 30 mg PO  (ORAL)  x 2 days 20 mg PO  (ORAL) x 2 days 10 mg PO  (ORAL) x 2 days then stop   Dispense: 42 tablet; Refill: 0   General Counseling: Nathan verbalizes understanding of the findings of todays visit and agrees with plan of treatment. I have discussed any further diagnostic evaluation that may be needed or ordered today. We also reviewed her medications today. she has been encouraged to call the office with any questions or concerns that should arise related to todays visit.    Counseling:    Orders Placed This Encounter  Procedures   DG Chest 2 View    Meds ordered this encounter  Medications   DISCONTD: predniSONE (DELTASONE) 10 MG tablet    Sig: Take 60 mg PO (ORAL) x 2 days 50 mg PO (ORAL)  x 2 days 40 mg PO (ORAL)  x 2 days 30 mg PO  (ORAL)  x 2 days 20 mg PO  (ORAL) x 2 days 10 mg PO  (ORAL) x 2 days then stop    Dispense:  42 tablet    Refill:  0    Please label and dispense as follows  Take  60 mg PO (ORAL) x 2 days  50 mg PO (ORAL)  x 2 days  40 mg PO (ORAL)  x 2 days  30 mg PO  (ORAL)  x 2 days  20 mg PO  (ORAL) x 2 days  10 mg PO  (ORAL) x 2 days then stop   DISCONTD: predniSONE (  DELTASONE) 10 MG tablet    Sig: Take 60 mg PO (ORAL) x 2 days 50 mg PO (ORAL)  x 2 days 40 mg PO (ORAL)  x 2 days 30 mg PO  (ORAL)  x 2 days 20 mg PO  (ORAL) x 2 days 10 mg PO  (ORAL) x 2 days then stop    Dispense:  42 tablet    Refill:  0    Please label and dispense as follows  Take  60 mg PO (ORAL) x 2 days  50 mg PO (ORAL)  x 2 days  40 mg PO (ORAL)  x 2 days  30 mg PO  (ORAL)  x 2 days  20 mg PO  (ORAL) x 2 days  10 mg PO  (ORAL) x 2 days then stop   DISCONTD: predniSONE (DELTASONE) 10 MG tablet    Sig: Take 60 mg PO (ORAL) x 2 days 50 mg PO (ORAL)  x 2 days 40 mg PO (ORAL)  x 2 days 30 mg PO  (ORAL)  x 2 days 20 mg PO  (ORAL) x 2 days 10 mg PO  (ORAL) x 2 days then stop    Dispense:  42 tablet    Refill:  0    Please label and dispense as follows  Take  60 mg PO (ORAL) x 2 days  50 mg PO (ORAL)  x 2 days  40 mg PO (ORAL)  x 2 days  30 mg PO  (ORAL)  x 2  days  20 mg PO  (ORAL) x 2 days  10 mg PO  (ORAL) x 2 days then stop   DISCONTD: predniSONE (DELTASONE) 10 MG tablet    Sig: Take 60 mg PO (ORAL) x 2 days 50 mg PO (ORAL)  x 2 days 40 mg PO (ORAL)  x 2 days 30 mg PO  (ORAL)  x 2 days 20 mg PO  (ORAL) x 2 days 10 mg PO  (ORAL) x 2 days then stop    Dispense:  42 tablet    Refill:  0    Please label and dispense as follows:Take 60 mg PO (ORAL) x 2 days, 50 mg PO (ORAL)  x 2 days, 40 mg PO (ORAL)  x 2 days, 30 mg PO  (ORAL)  x 2 days, 20 mg PO  (ORAL) x 2 days, 10 mg PO  (ORAL) x 2 days, then stop   predniSONE (DELTASONE) 10 MG tablet    Sig: Take 60 mg PO (ORAL) x 2 days 50 mg PO (ORAL)  x 2 days 40 mg PO (ORAL)  x 2 days 30 mg PO  (ORAL)  x 2 days 20 mg PO  (ORAL) x 2 days 10 mg PO  (ORAL) x 2 days then stop    Dispense:  42 tablet    Refill:  0    label & dispense as follows:Take 60 mg PO (ORAL) x 2 days, 50 mg PO (ORAL)  x 2 days, 40 mg PO (ORAL)  x 2 days, 30 mg PO  (ORAL)  x 2 days, 20 mg PO  (ORAL) x 2 days, 10 mg PO  (ORAL) x 2 days, then stop    Return if symptoms worsen or fail to improve.  [place] Controlled Substance Database was reviewed by me for overdose risk score (ORS)  Time spent:20 Minutes Time spent with patient included reviewing progress notes, labs, imaging studies, and discussing plan for follow up.   This  patient was seen by Jonetta Osgood, FNP-C in collaboration with Dr. Clayborn Bigness as a part of collaborative care agreement.  Sheral Pfahler R. Valetta Fuller, MSN, FNP-C Internal Medicine

## 2022-04-12 ENCOUNTER — Encounter: Payer: Self-pay | Admitting: Nurse Practitioner

## 2022-04-13 NOTE — Progress Notes (Signed)
Chest xray was negative for any changes or abnormalities. Please call patient and see how she is feeling today.

## 2022-04-22 ENCOUNTER — Encounter: Payer: Self-pay | Admitting: Nurse Practitioner

## 2022-04-22 ENCOUNTER — Ambulatory Visit (INDEPENDENT_AMBULATORY_CARE_PROVIDER_SITE_OTHER): Payer: 59 | Admitting: Nurse Practitioner

## 2022-04-22 VITALS — BP 131/64 | HR 103 | Temp 97.4°F | Resp 16 | Ht 63.0 in | Wt 164.0 lb

## 2022-04-22 DIAGNOSIS — J4541 Moderate persistent asthma with (acute) exacerbation: Secondary | ICD-10-CM | POA: Diagnosis not present

## 2022-04-22 DIAGNOSIS — R0602 Shortness of breath: Secondary | ICD-10-CM

## 2022-04-22 DIAGNOSIS — K449 Diaphragmatic hernia without obstruction or gangrene: Secondary | ICD-10-CM | POA: Diagnosis not present

## 2022-04-22 NOTE — Progress Notes (Signed)
[facility] [place], [place] [address]  Internal MEDICINE  Office Visit Note  Patient Name: Summer Murphy  [id number]  [id number]  Date of Service: 04/22/2022  Chief Complaint  Patient presents with   Follow-up    HPI Lydia presents for a follow-up visit for SOB  No improvement with prednisone taper,  Duoneb treatment several times daily, minimal improvement Had IM dose of depo medrol Chest xray was normal Echocardiogram done last year in January -- diastolic dysfunction and mild tricuspid regurgitation.  Increased SOB per patient report with minimal exertion.  Tachycardia with 6 minute walk -- HR 130s with o2 saturation at 90-92%. Does see cardiology  Chest xray from December does show a large hiatal hernia     Current Medication: No facility-administered encounter medications on file as of 04/22/2022.   Outpatient Encounter Medications as of 04/22/2022  Medication Sig Note   acetaminophen (TYLENOL) 500 MG tablet Take 2 tablets (1,000 mg total) by mouth every 6 (six) hours. (Patient not taking: Reported on 04/24/2022) 04/24/2022: Pt reports using OTC ibuprofen instead of acetaminophen b/c it works better    busPIRone (BUSPAR) 10 MG tablet Take 1 tablet (10 mg total) by mouth 2 (two) times daily.    clonazePAM (KLONOPIN) 1 MG tablet Take 0.5 tablets (0.5 mg total) by mouth 2 (two) times daily as needed for anxiety.    diclofenac Sodium (VOLTAREN) 1 % GEL Apply 4 g topically 4 (four) times daily.    diltiazem (CARTIA XT) 240 MG 24 hr capsule Take 1 capsule (240 mg total) by mouth daily.    DULoxetine (CYMBALTA) 60 MG capsule Take 1 capsule (60 mg total) by mouth at bedtime.    EPINEPHrine 0.3 mg/0.3 mL IJ SOAJ injection Inject 0.3 mg into the muscle as needed for anaphylaxis.    famotidine (PEPCID) 20 MG tablet Take 1 tablet (20 mg total) by mouth 2 (two) times daily. (Patient not taking: Reported on 04/24/2022)    fluticasone (FLONASE) 50 MCG/ACT nasal  spray Place 2 sprays into both nostrils daily. (Patient taking differently: Place 2 sprays into both nostrils daily as needed for rhinitis or allergies.)    fluticasone furoate-vilanterol (BREO ELLIPTA) 100-25 MCG/ACT AEPB Inhale 1 puff into the lungs daily.    gabapentin (NEURONTIN) 600 MG tablet Take 1 tablet (600 mg total) by mouth 2 (two) times daily.    hydrOXYzine (ATARAX) 25 MG tablet Take 1-2 tablets (25-50 mg total) by mouth every 6 (six) hours as needed for itching.    Ipratropium-Albuterol (COMBIVENT RESPIMAT) 20-100 MCG/ACT AERS respimat INHALE 1 PUFF INTO THE LUNGS EVERY 6 HOURS    ipratropium-albuterol (DUONEB) 0.5-2.5 (3) MG/3ML SOLN Take 3 mLs by nebulization every 4 (four) hours as needed (for shortness of breath/wheezing).    loratadine (CLARITIN) 10 MG tablet Take 1 tablet (10 mg total) by mouth daily. (Patient taking differently: Take 10 mg by mouth daily as needed for allergies, rhinitis or itching.)    meclizine (ANTIVERT) 25 MG tablet Take 0.5-1 tablets (12.5-25 mg total) by mouth 3 (three) times daily as needed for dizziness. (Patient not taking: Reported on 04/24/2022)    metroNIDAZOLE (FLAGYL) 500 MG tablet Take 1 tablet (500 mg total) by mouth 2 (two) times daily. (Patient not taking: Reported on 04/24/2022)    montelukast (SINGULAIR) 10 MG tablet Take 1 tablet (10 mg total) by mouth at bedtime.    nystatin (MYCOSTATIN) 100000 UNIT/ML suspension 5 cc swish and swallow after each meal and and  bed time x 5 days and then once a day as needed (Patient not taking: Reported on 04/24/2022)    ondansetron (ZOFRAN) 4 MG tablet TAKE 1 TABLET BY MOUTH TWICE DAILY AS NEEDED (Patient not taking: Reported on 04/24/2022)    pantoprazole (PROTONIX) 40 MG tablet Take 40 mg by mouth 2 (two) times daily.    pilocarpine (SALAGEN) 5 MG tablet Take 5 mg by mouth 2 (two) times daily.    predniSONE (DELTASONE) 10 MG tablet Take 60 mg PO (ORAL) x 2 days 50 mg PO (ORAL)  x 2 days 40 mg PO (ORAL)  x 2 days 30  mg PO  (ORAL)  x 2 days 20 mg PO  (ORAL) x 2 days 10 mg PO  (ORAL) x 2 days then stop (Patient not taking: Reported on 04/24/2022)    rOPINIRole (REQUIP) 0.5 MG tablet Take two tablets po BID for restless legs.    sennosides-docusate sodium (SENOKOT-S) 8.6-50 MG tablet Take 2 tablets by mouth daily. (Patient taking differently: Take 2 tablets by mouth daily as needed for constipation.)    SUMAtriptan (IMITREX) 100 MG tablet Take 1 tablet (100 mg total) by mouth every 2 (two) hours as needed (for migraine headaches.). May repeat in 1 hours if headache persists or recurs.    traZODone (DESYREL) 150 MG tablet TAKE 1 TABLET(150 MG) BY MOUTH AT BEDTIME    triamcinolone cream (KENALOG) 0.1 % Apply 1 Application topically 2 (two) times daily. To affected area for itching or until rash resolves. (Patient not taking: Reported on 04/24/2022)    valACYclovir (VALTREX) 500 MG tablet Take 1 tablet (500 mg total) by mouth daily as needed (for fever blisters). (Patient not taking: Reported on 04/24/2022)     Surgical History: Past Surgical History:  Procedure Laterality Date   ABDOMINAL HYSTERECTOMY     AMPUTATION TOE Left 07/31/2016   Procedure: AMPUTATION TOE/MPJ 2nd toe;  Surgeon: Sharlotte Alamo, DPM;  Location: [facility] ORS;  Service: Podiatry;  Laterality: Left;   APPENDECTOMY  1990   BACK SURGERY     low back   BREAST SURGERY     bilateral breast reduction   CARDIAC ELECTROPHYSIOLOGY STUDY AND ABLATION     CHOLECYSTECTOMY  1990   COLONOSCOPY WITH PROPOFOL N/A 01/04/2017   Procedure: COLONOSCOPY WITH PROPOFOL;  Surgeon: Manya Silvas, MD;  Location: [facility] ENDOSCOPY;  Service: Endoscopy;  Laterality: N/A;   CORNEAL TRANSPLANT     ESOPHAGOGASTRODUODENOSCOPY N/A 04/09/2021   Procedure: ESOPHAGOGASTRODUODENOSCOPY (EGD);  Surgeon: Lin Landsman, MD;  Location: [facility] ENDOSCOPY;  Service: Gastroenterology;  Laterality: N/A;   ESOPHAGOGASTRODUODENOSCOPY (EGD) WITH PROPOFOL N/A 01/04/2017   Procedure:  ESOPHAGOGASTRODUODENOSCOPY (EGD) WITH PROPOFOL;  Surgeon: Manya Silvas, MD;  Location: [facility] ENDOSCOPY;  Service: Endoscopy;  Laterality: N/A;   EXCISION BONE CYST Left 07/31/2016   Procedure: EXCISION BONE CYST/exostectomy 28124/left 2nd;  Surgeon: Sharlotte Alamo, DPM;  Location: [facility] ORS;  Service: Podiatry;  Laterality: Left;   EXTRACORPOREAL SHOCK WAVE LITHOTRIPSY Left 09/12/2015   Procedure: EXTRACORPOREAL SHOCK WAVE LITHOTRIPSY (ESWL);  Surgeon: Hollice Espy, MD;  Location: [facility] ORS;  Service: Urology;  Laterality: Left;   FRACTURE SURGERY     left foot   HARDWARE REMOVAL Left 11/14/2020   Procedure: HARDWARE REMOVAL;  Surgeon: Hessie Knows, MD;  Location: [facility] ORS;  Service: Orthopedics;  Laterality: Left;   HH repair     Fundoplication   JOINT REPLACEMENT Bilateral 2013,2014   total knees   LAPAROSCOPIC HYSTERECTOMY     LITHOTRIPSY  periprosthetic supracondylar fracture of left femur  02/16/2020   [facility]   TONSILLECTOMY     TOTAL KNEE REVISION Right 11/14/2019   Procedure: Revision patella and tibial polyethylene;  Surgeon: Hessie Knows, MD;  Location: [facility] ORS;  Service: Orthopedics;  Laterality: Right;   URETEROSCOPY      Medical History: Past Medical History:  Diagnosis Date   Acid reflux    Anemia    Anxiety    Arrhythmia    treated with meds and has no current problems   Arthritis    most uncomfortable in knees   Asthma    uses several inhalers   Depression    Fever blister    Hematuria    History of kidney stones    Hypertension    Hypoglycemia    Left flank pain    Migraine    Pre-diabetes    Restless leg    Yeast vaginitis     Family History: Family History  Problem Relation Age of Onset   Hypertension Mother    Stroke Mother    Prostate cancer Father    Kidney Stones Father    Diabetes Brother    Hypertension Brother    Breast cancer Maternal Aunt    Kidney disease Neg Hx     Social History   Socioeconomic History    Marital status: Widowed    Spouse name: Not on file   Number of children: Not on file   Years of education: Not on file   Highest education level: Not on file  Occupational History   Not on file  Tobacco Use   Smoking status: Never    Passive exposure: Past   Smokeless tobacco: Never  Vaping Use   Vaping Use: Never used  Substance and Sexual Activity   Alcohol use: No   Drug use: No   Sexual activity: Not on file  Other Topics Concern   Not on file  Social History Narrative   Not on file   Social Determinants of Health   Financial Resource Strain: Not on file  Food Insecurity: No Food Insecurity (04/24/2022)   Hunger Vital Sign    Worried About Running Out of Food in the Last Year: Never true    Ran Out of Food in the Last Year: Never true  Transportation Needs: No Transportation Needs (04/24/2022)   PRAPARE - Hydrologist (Medical): No    Lack of Transportation (Non-Medical): No  Physical Activity: Not on file  Stress: Not on file  Social Connections: Not on file  Intimate Partner Violence: Not At Risk (04/24/2022)   Humiliation, Afraid, Rape, and Kick questionnaire    Fear of Current or Ex-Partner: No    Emotionally Abused: No    Physically Abused: No    Sexually Abused: No      Review of Systems  Constitutional:  Positive for fatigue.  HENT: Negative.    Respiratory:  Positive for chest tightness and shortness of breath. Negative for cough and wheezing.   Cardiovascular: Negative.  Negative for chest pain and palpitations.  Gastrointestinal:  Positive for abdominal pain.       GERD  Neurological: Negative.     Vital Signs: BP 131/64   Pulse (!) 103   Temp (!) 97.4 F (36.3 C)   Resp 16   Ht '5\' 3"'$  (1.6 m)   Wt 164 lb (74.4 kg)   SpO2 93%   BMI 29.05 kg/m    Physical  Exam Vitals reviewed.  Constitutional:      General: She is not in acute distress.    Appearance: Normal appearance. She is ill-appearing.  HENT:     Head:  Normocephalic and atraumatic.  Eyes:     Pupils: Pupils are equal, round, and reactive to light.  Cardiovascular:     Rate and Rhythm: Regular rhythm. Tachycardia present.  Pulmonary:     Effort: Tachypnea and accessory muscle usage present. No respiratory distress.     Breath sounds: Decreased air movement present. No stridor. Examination of the right-upper field reveals decreased breath sounds. Examination of the left-upper field reveals decreased breath sounds. Examination of the right-middle field reveals decreased breath sounds. Examination of the left-middle field reveals decreased breath sounds. Examination of the right-lower field reveals decreased breath sounds. Examination of the left-lower field reveals decreased breath sounds. Decreased breath sounds present. No wheezing, rhonchi or rales.  Neurological:     Mental Status: She is alert and oriented to person, place, and time.  Psychiatric:        Mood and Affect: Mood is anxious.        Behavior: Behavior normal.        Assessment/Plan: 1. Large hiatal hernia Suspect hiatal hernia is causing SOB due to compression of lungs, surgical repair of the hernia may improve her breathing, referred to GI for further evaluation  - Ambulatory referral to Gastroenterology  2. Moderate persistent asthma with acute exacerbation 6 minute walk done today -- o2 sat 90% at lowest but HR was elevated in the 130s. CT chest ordered and PFT for further assessment of respiratory status.  - 6 minute walk - CT Chest Wo Contrast; Future - Pulmonary function test; Future  3. SOB (shortness of breath) SOB likely due to large hiatal hernia. Testing ordered to rule out other possible causes, GI referral ordered for further evaluation of hiatal hernia.  - 6 minute walk - CT Chest Wo Contrast; Future - Pulmonary function test; Future - Ambulatory referral to Gastroenterology   General Counseling: Ninamarie verbalizes understanding of the findings of  todays visit and agrees with plan of treatment. I have discussed any further diagnostic evaluation that may be needed or ordered today. We also reviewed her medications today. she has been encouraged to call the office with any questions or concerns that should arise related to todays visit.    Orders Placed This Encounter  Procedures   CT Chest Wo Contrast   Ambulatory referral to Gastroenterology   6 minute walk   Pulmonary function test    No orders of the defined types were placed in this encounter.   Return for F/U, PFT @ [place], Review labs/test, [place] PCP.   Total time spent:30 Minutes Time spent includes review of chart, medications, test results, and follow up plan with the patient.   [place] Controlled Substance Database was reviewed by me.  This patient was seen by Jonetta Osgood, FNP-C in collaboration with Dr. Clayborn Bigness as a part of collaborative care agreement.   Ariann Khaimov R. Valetta Fuller, MSN, FNP-C Internal medicine

## 2022-04-24 ENCOUNTER — Telehealth: Payer: Self-pay

## 2022-04-24 ENCOUNTER — Inpatient Hospital Stay
Admission: EM | Admit: 2022-04-24 | Discharge: 2022-04-26 | DRG: 871 | Disposition: A | Discharge status: Home / Self Care | Payer: 59 | Attending: Internal Medicine | Admitting: Internal Medicine

## 2022-04-24 ENCOUNTER — Encounter: Payer: Self-pay | Admitting: Internal Medicine

## 2022-04-24 ENCOUNTER — Emergency Department: Payer: 59

## 2022-04-24 ENCOUNTER — Ambulatory Visit: Payer: 59 | Attending: Nurse Practitioner

## 2022-04-24 ENCOUNTER — Other Ambulatory Visit: Payer: Self-pay

## 2022-04-24 DIAGNOSIS — Z8249 Family history of ischemic heart disease and other diseases of the circulatory system: Secondary | ICD-10-CM

## 2022-04-24 DIAGNOSIS — F419 Anxiety disorder, unspecified: Secondary | ICD-10-CM | POA: Diagnosis present

## 2022-04-24 DIAGNOSIS — I10 Essential (primary) hypertension: Secondary | ICD-10-CM | POA: Diagnosis present

## 2022-04-24 DIAGNOSIS — R0602 Shortness of breath: Secondary | ICD-10-CM | POA: Diagnosis present

## 2022-04-24 DIAGNOSIS — E1165 Type 2 diabetes mellitus with hyperglycemia: Secondary | ICD-10-CM | POA: Diagnosis present

## 2022-04-24 DIAGNOSIS — K219 Gastro-esophageal reflux disease without esophagitis: Secondary | ICD-10-CM | POA: Diagnosis present

## 2022-04-24 DIAGNOSIS — E785 Hyperlipidemia, unspecified: Secondary | ICD-10-CM | POA: Diagnosis present

## 2022-04-24 DIAGNOSIS — K449 Diaphragmatic hernia without obstruction or gangrene: Secondary | ICD-10-CM | POA: Diagnosis present

## 2022-04-24 DIAGNOSIS — J189 Pneumonia, unspecified organism: Secondary | ICD-10-CM | POA: Diagnosis present

## 2022-04-24 DIAGNOSIS — Z833 Family history of diabetes mellitus: Secondary | ICD-10-CM | POA: Diagnosis not present

## 2022-04-24 DIAGNOSIS — Z89422 Acquired absence of other left toe(s): Secondary | ICD-10-CM

## 2022-04-24 DIAGNOSIS — J449 Chronic obstructive pulmonary disease, unspecified: Secondary | ICD-10-CM | POA: Diagnosis present

## 2022-04-24 DIAGNOSIS — G2581 Restless legs syndrome: Secondary | ICD-10-CM | POA: Diagnosis present

## 2022-04-24 DIAGNOSIS — Z1152 Encounter for screening for COVID-19: Secondary | ICD-10-CM | POA: Diagnosis not present

## 2022-04-24 DIAGNOSIS — F32A Depression, unspecified: Secondary | ICD-10-CM | POA: Diagnosis present

## 2022-04-24 DIAGNOSIS — G43909 Migraine, unspecified, not intractable, without status migrainosus: Secondary | ICD-10-CM | POA: Diagnosis present

## 2022-04-24 DIAGNOSIS — Z79899 Other long term (current) drug therapy: Secondary | ICD-10-CM

## 2022-04-24 DIAGNOSIS — J441 Chronic obstructive pulmonary disease with (acute) exacerbation: Secondary | ICD-10-CM | POA: Diagnosis present

## 2022-04-24 DIAGNOSIS — G63 Polyneuropathy in diseases classified elsewhere: Secondary | ICD-10-CM | POA: Diagnosis present

## 2022-04-24 DIAGNOSIS — R509 Fever, unspecified: Secondary | ICD-10-CM

## 2022-04-24 DIAGNOSIS — R6520 Severe sepsis without septic shock: Secondary | ICD-10-CM | POA: Diagnosis present

## 2022-04-24 DIAGNOSIS — Z803 Family history of malignant neoplasm of breast: Secondary | ICD-10-CM

## 2022-04-24 DIAGNOSIS — Z881 Allergy status to other antibiotic agents status: Secondary | ICD-10-CM

## 2022-04-24 DIAGNOSIS — J209 Acute bronchitis, unspecified: Secondary | ICD-10-CM | POA: Diagnosis present

## 2022-04-24 DIAGNOSIS — I5189 Other ill-defined heart diseases: Secondary | ICD-10-CM

## 2022-04-24 DIAGNOSIS — A419 Sepsis, unspecified organism: Secondary | ICD-10-CM | POA: Diagnosis present

## 2022-04-24 DIAGNOSIS — J45909 Unspecified asthma, uncomplicated: Secondary | ICD-10-CM | POA: Diagnosis present

## 2022-04-24 DIAGNOSIS — E8721 Acute metabolic acidosis: Secondary | ICD-10-CM | POA: Diagnosis present

## 2022-04-24 DIAGNOSIS — Z87442 Personal history of urinary calculi: Secondary | ICD-10-CM

## 2022-04-24 DIAGNOSIS — Z90710 Acquired absence of both cervix and uterus: Secondary | ICD-10-CM

## 2022-04-24 DIAGNOSIS — Z888 Allergy status to other drugs, medicaments and biological substances status: Secondary | ICD-10-CM

## 2022-04-24 DIAGNOSIS — Z8042 Family history of malignant neoplasm of prostate: Secondary | ICD-10-CM

## 2022-04-24 DIAGNOSIS — E1142 Type 2 diabetes mellitus with diabetic polyneuropathy: Secondary | ICD-10-CM | POA: Diagnosis present

## 2022-04-24 DIAGNOSIS — Z91018 Allergy to other foods: Secondary | ICD-10-CM

## 2022-04-24 DIAGNOSIS — Z947 Corneal transplant status: Secondary | ICD-10-CM

## 2022-04-24 DIAGNOSIS — J440 Chronic obstructive pulmonary disease with acute lower respiratory infection: Secondary | ICD-10-CM | POA: Diagnosis present

## 2022-04-24 DIAGNOSIS — Z823 Family history of stroke: Secondary | ICD-10-CM

## 2022-04-24 DIAGNOSIS — Z885 Allergy status to narcotic agent status: Secondary | ICD-10-CM

## 2022-04-24 DIAGNOSIS — Z886 Allergy status to analgesic agent status: Secondary | ICD-10-CM

## 2022-04-24 DIAGNOSIS — Z9049 Acquired absence of other specified parts of digestive tract: Secondary | ICD-10-CM

## 2022-04-24 DIAGNOSIS — Z8701 Personal history of pneumonia (recurrent): Secondary | ICD-10-CM | POA: Diagnosis not present

## 2022-04-24 DIAGNOSIS — Z7951 Long term (current) use of inhaled steroids: Secondary | ICD-10-CM

## 2022-04-24 HISTORY — DX: Pneumonia, unspecified organism: J18.9

## 2022-04-24 LAB — URINALYSIS, ROUTINE W REFLEX MICROSCOPIC
Bilirubin Urine: NEGATIVE
Glucose, UA: 50 mg/dL — AB
Ketones, ur: NEGATIVE mg/dL
Leukocytes,Ua: NEGATIVE
Nitrite: NEGATIVE
Protein, ur: NEGATIVE mg/dL
Specific Gravity, Urine: 1.009 (ref 1.005–1.030)
pH: 5 (ref 5.0–8.0)

## 2022-04-24 LAB — COMPREHENSIVE METABOLIC PANEL
ALT: 26 U/L (ref 0–44)
AST: 24 U/L (ref 15–41)
Albumin: 3.5 g/dL (ref 3.5–5.0)
Alkaline Phosphatase: 73 U/L (ref 38–126)
Anion gap: 7 (ref 5–15)
BUN: 14 mg/dL (ref 8–23)
CO2: 26 mmol/L (ref 22–32)
Calcium: 8.2 mg/dL — ABNORMAL LOW (ref 8.9–10.3)
Chloride: 103 mmol/L (ref 98–111)
Creatinine, Ser: 0.79 mg/dL (ref 0.44–1.00)
GFR, Estimated: >60 mL/min (ref >60)
Glucose, Bld: 167 mg/dL — ABNORMAL HIGH (ref 70–99)
Potassium: 3.7 mmol/L (ref 3.5–5.1)
Sodium: 136 mmol/L (ref 135–145)
Total Bilirubin: 0.7 mg/dL (ref 0.3–1.2)
Total Protein: 6.1 g/dL — ABNORMAL LOW (ref 6.5–8.1)

## 2022-04-24 LAB — BLOOD GAS, VENOUS
Acid-Base Excess: 0.5 mmol/L (ref 0.0–2.0)
Bicarbonate: 25.4 mmol/L (ref 20.0–28.0)
O2 Saturation: 88.3 %
Patient temperature: 37
pCO2, Ven: 41 mmHg — ABNORMAL LOW (ref 44–60)
pH, Ven: 7.4 (ref 7.25–7.43)
pO2, Ven: 59 mmHg — ABNORMAL HIGH (ref 32–45)

## 2022-04-24 LAB — CBC WITH DIFFERENTIAL/PLATELET
Abs Immature Granulocytes: 0.06 10*3/uL (ref 0.00–0.07)
Basophils Absolute: 0 10*3/uL (ref 0.0–0.1)
Basophils Relative: 0 %
Eosinophils Absolute: 0.1 10*3/uL (ref 0.0–0.5)
Eosinophils Relative: 1 %
HCT: 37.2 % (ref 36.0–46.0)
Hemoglobin: 11.8 g/dL — ABNORMAL LOW (ref 12.0–15.0)
Immature Granulocytes: 0 %
Lymphocytes Relative: 5 %
Lymphs Abs: 0.8 10*3/uL (ref 0.7–4.0)
MCH: 29.4 pg (ref 26.0–34.0)
MCHC: 31.7 g/dL (ref 30.0–36.0)
MCV: 92.5 fL (ref 80.0–100.0)
Monocytes Absolute: 0.3 10*3/uL (ref 0.1–1.0)
Monocytes Relative: 2 %
Neutro Abs: 13.4 10*3/uL — ABNORMAL HIGH (ref 1.7–7.7)
Neutrophils Relative %: 92 %
Platelets: 213 10*3/uL (ref 150–400)
RBC: 4.02 MIL/uL (ref 3.87–5.11)
RDW: 13.3 % (ref 11.5–15.5)
WBC: 14.7 10*3/uL — ABNORMAL HIGH (ref 4.0–10.5)
nRBC: 0 % (ref 0.0–0.2)

## 2022-04-24 LAB — LACTIC ACID, PLASMA
Lactic Acid, Venous: 2.2 mmol/L (ref 0.5–1.9)
Lactic Acid, Venous: 1.7 mmol/L (ref 0.5–1.9)
Lactic Acid, Venous: 2.3 mmol/L (ref 0.5–1.9)

## 2022-04-24 LAB — RESP PANEL BY RT-PCR (RSV, FLU A&B, COVID)  RVPGX2
Influenza A by PCR: NEGATIVE
Influenza B by PCR: NEGATIVE
Resp Syncytial Virus by PCR: NEGATIVE
SARS Coronavirus 2 by RT PCR: NEGATIVE

## 2022-04-24 LAB — PROTIME-INR
INR: 0.9 (ref 0.8–1.2)
Prothrombin Time: 12.4 seconds (ref 11.4–15.2)

## 2022-04-24 LAB — TROPONIN I (HIGH SENSITIVITY)
Troponin I (High Sensitivity): 7 ng/L (ref <18)
Troponin I (High Sensitivity): 9 ng/L (ref <18)

## 2022-04-24 MED ORDER — FLUTICASONE PROPIONATE 50 MCG/ACT NA SUSP
2.0000 | Freq: Every day | NASAL | Status: DC
  Administered 2022-04-24 – 2022-04-26 (×3): 2 via NASAL
  Filled 2022-04-24: qty 16

## 2022-04-24 MED ORDER — TRAZODONE HCL 50 MG PO TABS
150.0000 mg | ORAL_TABLET | Freq: Every day | ORAL | Status: DC
  Administered 2022-04-24 – 2022-04-25 (×2): 150 mg via ORAL
  Filled 2022-04-24 (×2): qty 1

## 2022-04-24 MED ORDER — SODIUM CHLORIDE 0.9 % IV SOLN
2.0000 g | INTRAVENOUS | Status: DC
  Administered 2022-04-25 – 2022-04-26 (×2): 2 g via INTRAVENOUS
  Filled 2022-04-24 (×2): qty 20

## 2022-04-24 MED ORDER — BENZONATATE 100 MG PO CAPS
100.0000 mg | ORAL_CAPSULE | Freq: Two times a day (BID) | ORAL | Status: DC
  Administered 2022-04-24 – 2022-04-26 (×5): 100 mg via ORAL
  Filled 2022-04-24 (×5): qty 1

## 2022-04-24 MED ORDER — ENOXAPARIN SODIUM 40 MG/0.4ML IJ SOSY
40.0000 mg | PREFILLED_SYRINGE | INTRAMUSCULAR | Status: DC
  Administered 2022-04-24 – 2022-04-25 (×2): 40 mg via SUBCUTANEOUS
  Filled 2022-04-24 (×2): qty 0.4

## 2022-04-24 MED ORDER — CLONAZEPAM 0.5 MG PO TABS: 0.5000 mg | ORAL_TABLET | Freq: Two times a day (BID) | ORAL | Status: DC | PRN

## 2022-04-24 MED ORDER — IOHEXOL 350 MG/ML SOLN
75.0000 mL | Freq: Once | INTRAVENOUS | Status: AC | PRN
  Administered 2022-04-24: 75 mL via INTRAVENOUS

## 2022-04-24 MED ORDER — TRIAMCINOLONE ACETONIDE 0.1 % EX CREA: 1.0000 | TOPICAL_CREAM | Freq: Two times a day (BID) | CUTANEOUS | Status: DC

## 2022-04-24 MED ORDER — SODIUM CHLORIDE 0.9 % IV SOLN: INTRAVENOUS | Status: AC

## 2022-04-24 MED ORDER — GABAPENTIN 300 MG PO CAPS
600.0000 mg | ORAL_CAPSULE | Freq: Two times a day (BID) | ORAL | Status: DC
  Administered 2022-04-24 – 2022-04-26 (×4): 600 mg via ORAL
  Filled 2022-04-24 (×4): qty 2

## 2022-04-24 MED ORDER — SODIUM CHLORIDE 0.9 % IV BOLUS
1000.0000 mL | Freq: Once | INTRAVENOUS | Status: AC
  Administered 2022-04-24: 1000 mL via INTRAVENOUS

## 2022-04-24 MED ORDER — SODIUM CHLORIDE 0.9 % IV SOLN
100.0000 mg | Freq: Once | INTRAVENOUS | Status: AC
  Administered 2022-04-24: 100 mg via INTRAVENOUS
  Filled 2022-04-24 (×2): qty 100

## 2022-04-24 MED ORDER — LORATADINE 10 MG PO TABS
10.0000 mg | ORAL_TABLET | Freq: Every day | ORAL | Status: DC
  Administered 2022-04-24 – 2022-04-26 (×3): 10 mg via ORAL
  Filled 2022-04-24 (×3): qty 1

## 2022-04-24 MED ORDER — PANTOPRAZOLE SODIUM 40 MG PO TBEC
40.0000 mg | DELAYED_RELEASE_TABLET | Freq: Two times a day (BID) | ORAL | Status: DC
  Administered 2022-04-24 – 2022-04-26 (×4): 40 mg via ORAL
  Filled 2022-04-24 (×4): qty 1

## 2022-04-24 MED ORDER — HYDROXYZINE HCL 25 MG PO TABS: 25.0000 mg | ORAL_TABLET | Freq: Four times a day (QID) | ORAL | Status: DC | PRN

## 2022-04-24 MED ORDER — DULOXETINE HCL 30 MG PO CPEP
60.0000 mg | ORAL_CAPSULE | Freq: Every day | ORAL | Status: DC
  Administered 2022-04-24 – 2022-04-25 (×2): 60 mg via ORAL
  Filled 2022-04-24 (×2): qty 2

## 2022-04-24 MED ORDER — DICLOFENAC SODIUM 1 % EX GEL: 4.0000 g | Freq: Four times a day (QID) | CUTANEOUS | Status: DC

## 2022-04-24 MED ORDER — PILOCARPINE HCL 5 MG PO TABS: 5.0000 mg | ORAL_TABLET | Freq: Two times a day (BID) | ORAL | Status: DC

## 2022-04-24 MED ORDER — MECLIZINE HCL 25 MG PO TABS: 12.5000 mg | ORAL_TABLET | Freq: Three times a day (TID) | ORAL | Status: DC | PRN

## 2022-04-24 MED ORDER — ACETAMINOPHEN 500 MG PO TABS
1000.0000 mg | ORAL_TABLET | Freq: Once | ORAL | Status: AC
  Administered 2022-04-24: 1000 mg via ORAL
  Filled 2022-04-24: qty 2

## 2022-04-24 MED ORDER — SUMATRIPTAN SUCCINATE 50 MG PO TABS
100.0000 mg | ORAL_TABLET | ORAL | Status: DC | PRN
  Administered 2022-04-25 (×2): 100 mg via ORAL
  Filled 2022-04-24 (×2): qty 2

## 2022-04-24 MED ORDER — IPRATROPIUM-ALBUTEROL 0.5-2.5 (3) MG/3ML IN SOLN
3.0000 mL | Freq: Four times a day (QID) | RESPIRATORY_TRACT | Status: DC | PRN
  Administered 2022-04-25: 3 mL via RESPIRATORY_TRACT
  Filled 2022-04-24: qty 3

## 2022-04-24 MED ORDER — DILTIAZEM HCL ER COATED BEADS 120 MG PO CP24
240.0000 mg | ORAL_CAPSULE | Freq: Every day | ORAL | Status: DC
  Administered 2022-04-25 – 2022-04-26 (×2): 240 mg via ORAL
  Filled 2022-04-24 (×2): qty 2

## 2022-04-24 MED ORDER — SENNOSIDES-DOCUSATE SODIUM 8.6-50 MG PO TABS
2.0000 | ORAL_TABLET | Freq: Every day | ORAL | Status: DC
  Administered 2022-04-24 – 2022-04-26 (×2): 2 via ORAL
  Filled 2022-04-24 (×3): qty 2

## 2022-04-24 MED ORDER — ACETAMINOPHEN 500 MG PO TABS: 1000.0000 mg | ORAL_TABLET | Freq: Four times a day (QID) | ORAL | Status: DC

## 2022-04-24 MED ORDER — MAGNESIUM SULFATE 2 GM/50ML IV SOLN
2.0000 g | Freq: Once | INTRAVENOUS | Status: AC
  Administered 2022-04-24: 2 g via INTRAVENOUS
  Filled 2022-04-24: qty 50

## 2022-04-24 MED ORDER — IPRATROPIUM-ALBUTEROL 0.5-2.5 (3) MG/3ML IN SOLN
3.0000 mL | RESPIRATORY_TRACT | Status: DC
  Administered 2022-04-24 – 2022-04-25 (×2): 3 mL via RESPIRATORY_TRACT
  Filled 2022-04-24 (×2): qty 3

## 2022-04-24 MED ORDER — FLUTICASONE FUROATE-VILANTEROL 100-25 MCG/ACT IN AEPB
1.0000 | INHALATION_SPRAY | Freq: Every day | RESPIRATORY_TRACT | Status: DC
  Administered 2022-04-24 – 2022-04-26 (×3): 1 via RESPIRATORY_TRACT
  Filled 2022-04-24: qty 28

## 2022-04-24 MED ORDER — GUAIFENESIN ER 600 MG PO TB12
600.0000 mg | ORAL_TABLET | Freq: Two times a day (BID) | ORAL | Status: DC
  Administered 2022-04-24 – 2022-04-26 (×5): 600 mg via ORAL
  Filled 2022-04-24 (×5): qty 1

## 2022-04-24 MED ORDER — SODIUM CHLORIDE 0.9 % IV SOLN
100.0000 mg | Freq: Two times a day (BID) | INTRAVENOUS | Status: DC
  Administered 2022-04-24 – 2022-04-26 (×4): 100 mg via INTRAVENOUS
  Filled 2022-04-24 (×6): qty 100

## 2022-04-24 MED ORDER — SODIUM CHLORIDE 0.9 % IV SOLN
1.0000 g | Freq: Once | INTRAVENOUS | Status: AC
  Administered 2022-04-24: 1 g via INTRAVENOUS
  Filled 2022-04-24: qty 10

## 2022-04-24 MED ORDER — ROPINIROLE HCL 1 MG PO TABS
0.5000 mg | ORAL_TABLET | Freq: Two times a day (BID) | ORAL | Status: DC
  Administered 2022-04-24 – 2022-04-26 (×4): 0.5 mg via ORAL
  Filled 2022-04-24: qty 1
  Filled 2022-04-24: qty 2
  Filled 2022-04-24 (×2): qty 1

## 2022-04-24 MED ORDER — METHYLPREDNISOLONE SODIUM SUCC 40 MG IJ SOLR
40.0000 mg | Freq: Three times a day (TID) | INTRAMUSCULAR | Status: DC
  Administered 2022-04-25: 40 mg via INTRAVENOUS
  Filled 2022-04-24: qty 1

## 2022-04-24 MED ORDER — BUSPIRONE HCL 10 MG PO TABS
10.0000 mg | ORAL_TABLET | Freq: Two times a day (BID) | ORAL | Status: DC
  Administered 2022-04-24 – 2022-04-26 (×4): 10 mg via ORAL
  Filled 2022-04-24 (×4): qty 1

## 2022-04-24 MED ORDER — GABAPENTIN 600 MG PO TABS
600.0000 mg | ORAL_TABLET | Freq: Two times a day (BID) | ORAL | Status: DC
  Filled 2022-04-24: qty 1

## 2022-04-24 MED ORDER — FAMOTIDINE 20 MG PO TABS: 20.0000 mg | ORAL_TABLET | Freq: Two times a day (BID) | ORAL | Status: DC

## 2022-04-24 MED ORDER — ONDANSETRON HCL 4 MG PO TABS: 4.0000 mg | ORAL_TABLET | Freq: Three times a day (TID) | ORAL | Status: DC | PRN

## 2022-04-24 MED ORDER — METHYLPREDNISOLONE SODIUM SUCC 125 MG IJ SOLR
125.0000 mg | Freq: Once | INTRAMUSCULAR | Status: AC
  Administered 2022-04-24: 125 mg via INTRAVENOUS
  Filled 2022-04-24: qty 2

## 2022-04-24 MED ORDER — MONTELUKAST SODIUM 10 MG PO TABS
10.0000 mg | ORAL_TABLET | Freq: Every day | ORAL | Status: DC
  Administered 2022-04-24 – 2022-04-25 (×2): 10 mg via ORAL
  Filled 2022-04-24 (×2): qty 1

## 2022-04-24 NOTE — Telephone Encounter (Signed)
Pt called that she had  2  neb treatment and she is having SOB ,no chest pain as per alyssa advised her to go to ED

## 2022-04-24 NOTE — ED Triage Notes (Signed)
Pt here with SOB for a few weeks. Pt was seen recently at her pulmonologist. Pt denies CP but does have labored breathing.

## 2022-04-24 NOTE — ED Notes (Signed)
Estimated time for bed to be ready 1-2 hours.

## 2022-04-24 NOTE — ED Notes (Signed)
Pt ambulatory to bedside commode.

## 2022-04-24 NOTE — ED Notes (Signed)
[facility] arrived to transport patient. Pt stable at time of departure.

## 2022-04-24 NOTE — ED Provider Notes (Addendum)
[facility] Provider Note    Event Date/Time   First MD Initiated Contact with Patient 04/24/22 1124     (approximate)   History   Shortness of Breath   HPI  Summer Murphy is a 67 y.o. female   Past medical history of COPD/asthma, type 2 diabetes, who presents with several weeks of shortness of breath.  She has been to the doctor several times to assess but symptoms have been ongoing and unchanged.  She denies cough, fever, chills, or chest pain or chest discomfort.  Denies GI or GU symptoms.  He has been using her albuterol inhaler for asthma and has transient relief.  She denies fever, but is with a low-grade fever in the emergency department today at 100.9 Fahrenheit.  She states that she has had 1 day of loose stools no bleeding as well.  No nausea or vomiting.  No urinary symptoms.  She  has no leg pain or swelling.  No history of CHF and is not on diuretics.  Shortness of breath is worse with exertion.   External Medical Documents Reviewed: NP note from outpatient clinic visit dated 04/22/2022 which is incomplete but notes assessment for exertional dyspnea that has been refractory to prednisone taper and DuoNebs, and an assessment that states tachycardia with exertion.      Physical Exam   Triage Vital Signs: ED Triage Vitals  Enc Vitals Group     BP 04/24/22 1115 (!) 147/71     Pulse Rate 04/24/22 1115 (!) 136     Resp 04/24/22 1115 (!) 26     Temp 04/24/22 1115 (!) 100.9 F (38.3 C)     Temp Source 04/24/22 1115 Oral     SpO2 04/24/22 1115 97 %     Weight 04/24/22 1117 164 lb 0.4 oz (74.4 kg)     Height 04/24/22 1117 '5\' 3"'$  (1.6 m)     Head Circumference --      Peak Flow --      Pain Score 04/24/22 1116 0     Pain Loc --      Pain Edu? --      Excl. in [place]? --     Most recent vital signs: Vitals:   04/24/22 1405 04/24/22 1407  BP:  131/80  Pulse: (!) 121 (!) 120  Resp: 16 14  Temp:    SpO2: 94% 95%     General: Awake, no distress.  CV:  Good peripheral perfusion.  Resp:  Lungs clear to auscultation without focality but a scant wheeze at the apices bilaterally and respiratory rate increased at 26. Abd:  No distention.  Soft and nontender. Other:  Speaking in full sentences has a low-grade fever 100.9 and tachycardia 136 and respiratory rate elevated at 26 no hypoxemia, room air saturation is 97%.  Her mucous membranes appear slightly dry   ED Results / Procedures / Treatments   Labs (all labs ordered are listed, but only abnormal results are displayed) Labs Reviewed  COMPREHENSIVE METABOLIC PANEL - Abnormal; Notable for the following components:      Result Value   Glucose, Bld 167 (*)    Calcium 8.2 (*)    Total Protein 6.1 (*)    All other components within normal limits  LACTIC ACID, PLASMA - Abnormal; Notable for the following components:   Lactic Acid, Venous 2.2 (*)    All other components within normal limits  CBC WITH DIFFERENTIAL/PLATELET - Abnormal; Notable for the following components:  WBC 14.7 (*)    Hemoglobin 11.8 (*)    Neutro Abs 13.4 (*)    All other components within normal limits  URINALYSIS, ROUTINE W REFLEX MICROSCOPIC - Abnormal; Notable for the following components:   Color, Urine STRAW (*)    APPearance CLEAR (*)    Glucose, UA 50 (*)    Hgb urine dipstick MODERATE (*)    Bacteria, UA RARE (*)    All other components within normal limits  BLOOD GAS, VENOUS - Abnormal; Notable for the following components:   pCO2, Ven 41 (*)    pO2, Ven 59 (*)    All other components within normal limits  RESP PANEL BY RT-PCR (RSV, FLU A&B, COVID)  RVPGX2  CULTURE, BLOOD (ROUTINE X 2)  CULTURE, BLOOD (ROUTINE X 2)  PROTIME-INR  LACTIC ACID, PLASMA  LACTIC ACID, PLASMA  TROPONIN I (HIGH SENSITIVITY)  TROPONIN I (HIGH SENSITIVITY)     I ordered and reviewed the above labs they are notable for elevated white blood cell count 14.7 lactic acidosis  2.2  EKG  ED ECG REPORT I, Lucillie Garfinkel, the attending physician, personally viewed and interpreted this ECG.   Date: 04/24/2022  EKG Time: 1120  Rate: 133  Rhythm: Sinus tachycardia with left bundle branch block  Axis: Left axis deviation  Intervals:left bundle branch block  ST&T Change: No acute ischemic changes, sinus tachycardia with left bundle branch block is pre-existing, morphologies are similar to prior EKG    RADIOLOGY I independently reviewed and interpreted CT angiogram of the chest and see no obvious filling defects   PROCEDURES:  Critical Care performed: Yes, see critical care procedure note(s)  .Critical Care  Performed by: Lucillie Garfinkel, MD Authorized by: Lucillie Garfinkel, MD   Critical care provider statement:    Critical care time (minutes):  30   Critical care was time spent personally by me on the following activities:  Development of treatment plan with patient or surrogate, discussions with consultants, evaluation of patient's response to treatment, examination of patient, ordering and review of laboratory studies, ordering and review of radiographic studies, ordering and performing treatments and interventions, pulse oximetry, re-evaluation of patient's condition and review of old [place] ED: Medications  doxycycline (VIBRAMYCIN) 100 mg in sodium chloride 0.9 % 250 mL IVPB (100 mg Intravenous New Bag/Given 04/24/22 1313)  acetaminophen (TYLENOL) tablet 1,000 mg (1,000 mg Oral Given 04/24/22 1209)  sodium chloride 0.9 % bolus 1,000 mL (0 mLs Intravenous Stopped 04/24/22 1314)  cefTRIAXone (ROCEPHIN) 1 g in sodium chloride 0.9 % 100 mL IVPB (0 g Intravenous Stopped 04/24/22 1314)  sodium chloride 0.9 % bolus 1,000 mL (1,000 mLs Intravenous New Bag/Given 04/24/22 1314)  iohexol (OMNIPAQUE) 350 MG/ML injection 75 mL (75 mLs Intravenous Contrast Given 04/24/22 1353)    External physician / consultants:  I spoke with hospitalist for admission and  regarding care plan for this patient.   IMPRESSION / MDM / ASSESSMENT AND PLAN / ED COURSE  I reviewed the triage vital signs and the nursing notes.                                Patient's presentation is most consistent with acute presentation with potential threat to life or bodily function.  Differential diagnosis includes, but is not limited to, respiratory infection, sepsis, COPD/asthma exacerbation, PE, ACS, dehydration/electrolyte disturbance   The patient is on the cardiac monitor  to evaluate for evidence of arrhythmia and/or significant heart rate changes.  MDM: This is a patient with exertional dyspnea and now low-grade fever with tachycardia and elevated respiratory rate.  Differential diagnosis as above, will give fluids given clinical dehydration with dry mucous membranes and diarrhea earlier today check infections with basic labs, chest x-ray, viral swabs, and give Tylenol.    Will treat with empiric community-acquired pneumonia coverage given heart rate elevation, tachypnea and fever.  Cannot wheezing I doubt this is a severe COPD/asthma exacerbation given adequate treatment prior as well as steroid therapy which did not resolve her symptoms.  I will hold off on further albuterol given her heart rate increase at this time and treat infectious etiologies.  PE is on the differential given exertional dyspnea tachycardia low-grade fever refractory to treatments in the past, will get a CT angiogram to evaluate.   Patient's tachycardia is now getting slightly better in the 110s after 2 L of normal saline via IV, which is 30 cc/kg ideal body weight sepsis bolus.  She continues to have the sensation of shortness of breath but her respiratory rate is now 16 and oxygen saturation remains normal on room air.  Her CT angiogram is negative for PE but shows signs of multifocal pneumonia on the right lung especially, admit for multifocal pneumonia sepsis.      FINAL CLINICAL  IMPRESSION(S) / ED DIAGNOSES   Final diagnoses:  SOB (shortness of breath)  Fever, unspecified fever cause  Multifocal pneumonia  Sepsis, due to unspecified organism, unspecified whether acute organ dysfunction present [facility])     Rx / DC Orders   ED Discharge Orders     None        Note:  This document was prepared using Dragon voice recognition software and may include unintentional dictation errors.    Lucillie Garfinkel, MD 04/24/22 1419    Lucillie Garfinkel, MD 04/24/22 [phone]

## 2022-04-24 NOTE — H&P (Signed)
History and Physical    ANWYN KRIEGEL BSJ:[id number] DOB: 12/17/1955 DOA: 04/24/2022  PCP: Lavera Guise, MD (Confirm with patient/family/NH records and if not entered, this has to be entered at [facility] point of entry) Patient coming from: Home  I have personally briefly reviewed patient's old medical records in [place]  Chief Complaint: Wheezing cough, SOB  HPI: Summer Murphy is a 67 y.o. female with medical history significant of COPD/asthma Gold stage II, hiatal hernia, HTN, HLD, restless leg syndrome, anxiety/depression, migraines, came with worsening of dry cough, wheezing and SOB.  Symptoms started 3 to 4 weeks ago, when patient started to develop new onset of dry cough wheezing shortness of breath.  Denies any chest pain fever chills.  She went to see her pulmonary 2 times, and the first time was treated with p.o. steroid and symptoms improved however last week she developed another episode of wheezing and shortness of breath and went back to see pulmonary again, and was prescribed with 20 mg of p.o. steroid however her symptoms is not improving and her prednisone dosage titrated up to 50 mg daily.  Despite increased dose of steroid she still continues to experience wheezing dry cough and worsening of exertional dyspnea, but denies any fever chills chest pain.  Patient also reported that she has history of large hiatal hernia and GERD which is poorly controlled, but denies any history of aspiration and denies any cough or choking after eat or drink.  ED Course: Fever of 101.0, tachycardia tachypneic consideration 95% on room air.  Chest x-ray showed multifocal infiltrates and CT angiogram negative for PE but suspicious for multifocal pneumonia.  Patient was given ceftriaxone and azithromycin in the ED.  Review of Systems: As per HPI otherwise 14 point review of systems negative.    Past Medical History:  Diagnosis Date   Acid reflux    Anemia    Anxiety    Arrhythmia     treated with meds and has no current problems   Arthritis    most uncomfortable in knees   Asthma    uses several inhalers   Depression    Fever blister    Hematuria    History of kidney stones    Hypertension    Hypoglycemia    Left flank pain    Migraine    Pre-diabetes    Restless leg    Yeast vaginitis     Past Surgical History:  Procedure Laterality Date   ABDOMINAL HYSTERECTOMY     AMPUTATION TOE Left 07/31/2016   Procedure: AMPUTATION TOE/MPJ 2nd toe;  Surgeon: Sharlotte Alamo, DPM;  Location: [facility] ORS;  Service: Podiatry;  Laterality: Left;   APPENDECTOMY  1990   BACK SURGERY     low back   BREAST SURGERY     bilateral breast reduction   CARDIAC ELECTROPHYSIOLOGY STUDY AND ABLATION     CHOLECYSTECTOMY  1990   COLONOSCOPY WITH PROPOFOL N/A 01/04/2017   Procedure: COLONOSCOPY WITH PROPOFOL;  Surgeon: Manya Silvas, MD;  Location: [facility] ENDOSCOPY;  Service: Endoscopy;  Laterality: N/A;   CORNEAL TRANSPLANT     ESOPHAGOGASTRODUODENOSCOPY N/A 04/09/2021   Procedure: ESOPHAGOGASTRODUODENOSCOPY (EGD);  Surgeon: Lin Landsman, MD;  Location: [facility] ENDOSCOPY;  Service: Gastroenterology;  Laterality: N/A;   ESOPHAGOGASTRODUODENOSCOPY (EGD) WITH PROPOFOL N/A 01/04/2017   Procedure: ESOPHAGOGASTRODUODENOSCOPY (EGD) WITH PROPOFOL;  Surgeon: Manya Silvas, MD;  Location: [facility] ENDOSCOPY;  Service: Endoscopy;  Laterality: N/A;   EXCISION BONE CYST Left 07/31/2016  Procedure: EXCISION BONE CYST/exostectomy 28124/left 2nd;  Surgeon: Sharlotte Alamo, DPM;  Location: [facility] ORS;  Service: Podiatry;  Laterality: Left;   EXTRACORPOREAL SHOCK WAVE LITHOTRIPSY Left 09/12/2015   Procedure: EXTRACORPOREAL SHOCK WAVE LITHOTRIPSY (ESWL);  Surgeon: Hollice Espy, MD;  Location: [facility] ORS;  Service: Urology;  Laterality: Left;   FRACTURE SURGERY     left foot   HARDWARE REMOVAL Left 11/14/2020   Procedure: HARDWARE REMOVAL;  Surgeon: Hessie Knows, MD;  Location: [facility] ORS;  Service:  Orthopedics;  Laterality: Left;   [place] repair     Fundoplication   JOINT REPLACEMENT Bilateral 2013,2014   total knees   LAPAROSCOPIC HYSTERECTOMY     LITHOTRIPSY     periprosthetic supracondylar fracture of left femur  02/16/2020   [facility]   TONSILLECTOMY     TOTAL KNEE REVISION Right 11/14/2019   Procedure: Revision patella and tibial polyethylene;  Surgeon: Hessie Knows, MD;  Location: [facility] ORS;  Service: Orthopedics;  Laterality: Right;   URETEROSCOPY       reports that she has never smoked. She has been exposed to tobacco smoke. She has never used smokeless tobacco. She reports that she does not drink alcohol and does not use drugs.  Allergies  Allergen Reactions   Librium [Chlordiazepoxide] Shortness Of Breath   Metoclopramide Hives and Other (See Comments)    hallucinations    Vanilla Shortness Of Breath   Aspirin Hives   Nsaids Rash    Rash/flares asthma issues.   Azithromycin Other (See Comments)    Unsure of what the reaction was.   Buprenorphine Hcl Other (See Comments)    Unsure of what the reaction was.    Chlordiazepoxide Hcl Other (See Comments)    unsure   Morphine Other (See Comments)    Unsure of reaction   Sulfa Antibiotics     Other reaction(s): Unknown   Tolmetin     Other Reaction: Allergy   Amlodipine Besylate Itching and Rash    arms, stomach and forehead   Iron Nausea And Vomiting    Other reaction(s): Unknown    Family History  Problem Relation Age of Onset   Hypertension Mother    Stroke Mother    Prostate cancer Father    Kidney Stones Father    Diabetes Brother    Hypertension Brother    Breast cancer Maternal Aunt    Kidney disease Neg Hx      Prior to Admission medications   Medication Sig Start Date End Date Taking? Authorizing Provider  busPIRone (BUSPAR) 10 MG tablet Take 1 tablet (10 mg total) by mouth 2 (two) times daily. 12/31/21  Yes Abernathy, Yetta Flock, NP  clonazePAM (KLONOPIN) 1 MG tablet Take 0.5 tablets (0.5  mg total) by mouth 2 (two) times daily as needed for anxiety. 12/31/21  Yes Abernathy, Yetta Flock, NP  diclofenac Sodium (VOLTAREN) 1 % GEL Apply 4 g topically 4 (four) times daily. 08/13/20  Yes Lavera Guise, MD  diltiazem (CARTIA XT) 240 MG 24 hr capsule Take 1 capsule (240 mg total) by mouth daily. 12/11/21  Yes Lavera Guise, MD  EPINEPHrine 0.3 mg/0.3 mL IJ SOAJ injection Inject 0.3 mg into the muscle as needed for anaphylaxis. 12/18/20  Yes Lavera Guise, MD  fluticasone [facility]) 50 MCG/ACT nasal spray Place 2 sprays into both nostrils daily. Patient taking differently: Place 2 sprays into both nostrils daily as needed for rhinitis or allergies. 09/18/21  Yes Abernathy, Alyssa, NP  fluticasone furoate-vilanterol (BREO ELLIPTA) 100-25 MCG/ACT  AEPB Inhale 1 puff into the lungs daily. 04/29/21  Yes Allyne Gee, MD  gabapentin (NEURONTIN) 600 MG tablet Take 1 tablet (600 mg total) by mouth 2 (two) times daily. 12/31/21  Yes Abernathy, Yetta Flock, NP  hydrOXYzine (ATARAX) 25 MG tablet Take 1-2 tablets (25-50 mg total) by mouth every 6 (six) hours as needed for itching. 10/28/21  Yes Abernathy, Alyssa, NP  Ipratropium-Albuterol (COMBIVENT RESPIMAT) 20-100 MCG/ACT AERS respimat INHALE 1 PUFF INTO THE LUNGS EVERY 6 HOURS 03/24/22  Yes Abernathy, Alyssa, NP  ipratropium-albuterol (DUONEB) 0.5-2.5 (3) MG/3ML SOLN Take 3 mLs by nebulization every 4 (four) hours as needed (for shortness of breath/wheezing). 01/25/19  Yes Scarboro, Audie Clear, NP  loratadine (CLARITIN) 10 MG tablet Take 1 tablet (10 mg total) by mouth daily. Patient taking differently: Take 10 mg by mouth daily as needed for allergies, rhinitis or itching. 06/12/21  Yes Abernathy, Alyssa, NP  montelukast (SINGULAIR) 10 MG tablet Take 1 tablet (10 mg total) by mouth at bedtime. 12/31/21  Yes Abernathy, Alyssa, NP  pantoprazole (PROTONIX) 40 MG tablet Take 40 mg by mouth 2 (two) times daily.   Yes [provider]  sennosides-docusate sodium (SENOKOT-S)  8.6-50 MG tablet Take 2 tablets by mouth daily. Patient taking differently: Take 2 tablets by mouth daily as needed for constipation. 11/15/20  Yes Duanne Guess, PA-C  SUMAtriptan (IMITREX) 100 MG tablet Take 1 tablet (100 mg total) by mouth every 2 (two) hours as needed (for migraine headaches.). May repeat in 1 hours if headache persists or recurs. 09/18/21  Yes Abernathy, Yetta Flock, NP  traZODone (DESYREL) 150 MG tablet TAKE 1 TABLET(150 MG) BY MOUTH AT BEDTIME 04/05/22  Yes Lavera Guise, MD  acetaminophen (TYLENOL) 500 MG tablet Take 2 tablets (1,000 mg total) by mouth every 6 (six) hours. Patient not taking: Reported on 04/24/2022 11/15/20   Duanne Guess, PA-C  DULoxetine (CYMBALTA) 60 MG capsule Take 1 capsule (60 mg total) by mouth at bedtime. 12/03/21   Jonetta Osgood, NP  famotidine (PEPCID) 20 MG tablet Take 1 tablet (20 mg total) by mouth 2 (two) times daily. Patient not taking: Reported on 04/24/2022 07/01/21   Lavera Guise, MD  meclizine (ANTIVERT) 25 MG tablet Take 0.5-1 tablets (12.5-25 mg total) by mouth 3 (three) times daily as needed for dizziness. Patient not taking: Reported on 04/24/2022 01/15/22   Jonetta Osgood, NP  metroNIDAZOLE (FLAGYL) 500 MG tablet Take 1 tablet (500 mg total) by mouth 2 (two) times daily. Patient not taking: Reported on 04/24/2022 03/24/22   Jonetta Osgood, NP  nystatin (MYCOSTATIN) 100000 UNIT/ML suspension 5 cc swish and swallow after each meal and and bed time x 5 days and then once a day as needed Patient not taking: Reported on 04/24/2022 12/11/21   Lavera Guise, MD  ondansetron (ZOFRAN) 4 MG tablet TAKE 1 TABLET BY MOUTH TWICE DAILY AS NEEDED Patient not taking: Reported on 04/24/2022 11/06/21   Lavera Guise, MD  pilocarpine (SALAGEN) 5 MG tablet Take 5 mg by mouth 2 (two) times daily.    [provider]  predniSONE (DELTASONE) 10 MG tablet Take 60 mg PO (ORAL) x 2 days 50 mg PO (ORAL)  x 2 days 40 mg PO (ORAL)  x 2 days 30 mg PO  (ORAL)  x 2  days 20 mg PO  (ORAL) x 2 days 10 mg PO  (ORAL) x 2 days then stop Patient not taking: Reported on 04/24/2022 04/09/22   Jonetta Osgood,  NP  rOPINIRole (REQUIP) 0.5 MG tablet Take two tablets po BID for restless legs. 09/18/21   Jonetta Osgood, NP  triamcinolone cream (KENALOG) 0.1 % Apply 1 Application topically 2 (two) times daily. To affected area for itching or until rash resolves. Patient not taking: Reported on 04/24/2022 10/28/21   Jonetta Osgood, NP  valACYclovir (VALTREX) 500 MG tablet Take 1 tablet (500 mg total) by mouth daily as needed (for fever blisters). Patient not taking: Reported on 04/24/2022 08/13/20   Lavera Guise, MD    Physical Exam: Vitals:   04/24/22 1430 04/24/22 1508 04/24/22 1524 04/24/22 1531  BP: 125/69 103/76 106/69 106/69  Pulse: (!) 116 (!) 121 (!) 118 (!) 119  Resp: (!) 22 (!) 25 20 (!) 21  Temp:   99.3 F (37.4 C)   TempSrc:   Oral   SpO2: 94% 93% 95% 95%  Weight:      Height:        Constitutional: NAD, calm, comfortable Vitals:   04/24/22 1430 04/24/22 1508 04/24/22 1524 04/24/22 1531  BP: 125/69 103/76 106/69 106/69  Pulse: (!) 116 (!) 121 (!) 118 (!) 119  Resp: (!) 22 (!) 25 20 (!) 21  Temp:   99.3 F (37.4 C)   TempSrc:   Oral   SpO2: 94% 93% 95% 95%  Weight:      Height:       Eyes: PERRL, lids and conjunctivae normal ENMT: Mucous membranes are moist. Posterior pharynx clear of any exudate or lesions.Normal dentition.  Neck: normal, supple, no masses, no thyromegaly Respiratory: Diminished breathing sound bilaterally, diffused wheezing, no crackles.  Increasing respiratory effort. No accessory muscle use.  Cardiovascular: Regular rate and rhythm, no murmurs / rubs / gallops. No extremity edema. 2+ pedal pulses. No carotid bruits.  Abdomen: no tenderness, no masses palpated. No hepatosplenomegaly. Bowel sounds positive.  Musculoskeletal: no clubbing / cyanosis. No joint deformity upper and lower extremities. Good ROM, no contractures.  Normal muscle tone.  Skin: no rashes, lesions, ulcers. No induration Neurologic: CN 2-12 grossly intact. Sensation intact, DTR normal. Strength 5/5 in all 4.  Psychiatric: Normal judgment and insight. Alert and oriented x 3. Normal mood.     Labs on Admission: I have personally reviewed following labs and imaging studies  CBC: Recent Labs  Lab 04/24/22 1159  WBC 14.7*  NEUTROABS 13.4*  HGB 11.8*  HCT 37.2  MCV 92.5  PLT 242   Basic Metabolic Panel: Recent Labs  Lab 04/24/22 1159  NA 136  K 3.7  CL 103  CO2 26  GLUCOSE 167*  BUN 14  CREATININE 0.79  CALCIUM 8.2*   GFR: Estimated Creatinine Clearance: 66.8 mL/min (by C-G formula based on SCr of 0.79 mg/dL). Liver Function Tests: Recent Labs  Lab 04/24/22 1159  AST 24  ALT 26  ALKPHOS 73  BILITOT 0.7  PROT 6.1*  ALBUMIN 3.5   No results for input(s): "LIPASE", "AMYLASE" in the last 168 hours. No results for input(s): "AMMONIA" in the last 168 hours. Coagulation Profile: Recent Labs  Lab 04/24/22 1159  INR 0.9   Cardiac Enzymes: No results for input(s): "CKTOTAL", "CKMB", "CKMBINDEX", "TROPONINI" in the last 168 hours. BNP (last 3 results) No results for input(s): "PROBNP" in the last 8760 hours. HbA1C: No results for input(s): "HGBA1C" in the last 72 hours. CBG: No results for input(s): "GLUCAP" in the last 168 hours. Lipid Profile: No results for input(s): "CHOL", "HDL", "LDLCALC", "TRIG", "CHOLHDL", "LDLDIRECT" in the last 72 hours.  Thyroid Function Tests: No results for input(s): "TSH", "T4TOTAL", "FREET4", "T3FREE", "THYROIDAB" in the last 72 hours. Anemia Panel: No results for input(s): "VITAMINB12", "FOLATE", "FERRITIN", "TIBC", "IRON", "RETICCTPCT" in the last 72 hours. Urine analysis:    Component Value Date/Time   COLORURINE STRAW (A) 04/24/2022 1158   APPEARANCEUR CLEAR (A) 04/24/2022 1158   APPEARANCEUR Turbid (A) 09/18/2021 1546   LABSPEC 1.009 04/24/2022 1158   LABSPEC 1.011  06/28/2014 1724   PHURINE 5.0 04/24/2022 1158   GLUCOSEU 50 (A) 04/24/2022 1158   GLUCOSEU Negative 06/28/2014 1724   HGBUR MODERATE (A) 04/24/2022 1158   BILIRUBINUR NEGATIVE 04/24/2022 1158   BILIRUBINUR Negative 09/18/2021 1546   BILIRUBINUR Negative 06/28/2014 1724   KETONESUR NEGATIVE 04/24/2022 1158   PROTEINUR NEGATIVE 04/24/2022 1158   NITRITE NEGATIVE 04/24/2022 1158   LEUKOCYTESUR NEGATIVE 04/24/2022 1158   LEUKOCYTESUR Negative 06/28/2014 1724    Radiological Exams on Admission: CT Angio Chest PE W/Cm &/Or Wo Cm  Result Date: 04/24/2022 CLINICAL DATA:  Pulmonary embolism suspected, high probability. Shortness of breath for a few weeks. EXAM: CT ANGIOGRAPHY CHEST WITH CONTRAST TECHNIQUE: Multidetector CT imaging of the chest was performed using the standard protocol during bolus administration of intravenous contrast. Multiplanar CT image reconstructions and MIPs were obtained to evaluate the vascular anatomy. RADIATION DOSE REDUCTION: This exam was performed according to the departmental dose-optimization program which includes automated exposure control, adjustment of the mA and/or kV according to patient size and/or use of iterative reconstruction technique. CONTRAST:  56m OMNIPAQUE IOHEXOL 350 MG/ML SOLN COMPARISON:  Chest CT dated 11/05/2011. FINDINGS: Cardiovascular: There is no pulmonary embolism identified within the main, lobar or segmental pulmonary arteries bilaterally. No thoracic aortic aneurysm or evidence of aortic dissection. No pericardial effusion. Mediastinum/Nodes: No mass or enlarged lymph nodes are seen within the mediastinum. Large hiatal hernia. Surgical clips are seen at the gastroesophageal junction, presumably related to a previous hernia repair. Upper portion of the esophagus is unremarkable. Trachea and central bronchi are unremarkable. Lungs/Pleura: Clustered nodular and ground-glass consolidations are seen throughout the RIGHT upper lobe, RIGHT perihilar  lung, RIGHT middle lobe and RIGHT lower lobe, with most prominent consolidation in the RIGHT infrahilar lung. LEFT lung is clear. No pleural effusion or pneumothorax. Upper Abdomen: No acute findings are seen on the limited images of the upper abdomen. Musculoskeletal: Degenerative spondylosis of the kyphotic thoracolumbar spine, at least moderate in degree. No acute-appearing osseous abnormality. Review of the MIP images confirms the above findings. IMPRESSION: 1. Diffuse nodular and ground-glass consolidations throughout the RIGHT lung, with most prominent consolidation in the RIGHT infrahilar lung. Findings are most consistent with multifocal pneumonia or aspiration, favor multifocal pneumonia. Differential includes atypical pneumonias such as viral or fungal, interstitial pneumonias, hypersensitivity pneumonitis, and respiratory bronchiolitis. 2. No pulmonary embolism seen. 3. Large hiatal hernia. Electronically Signed   By: SFranki CabotM.D.   On: 04/24/2022 14:12   DG Chest 2 View  Result Date: 04/24/2022 CLINICAL DATA:  Shortness of breath.  Suspected sepsis. EXAM: CHEST - 2 VIEW COMPARISON:  Chest x-ray dated 04/09/2022 and chest x-ray dated 03/08/2022. FINDINGS: Heart size and mediastinal contours are stable. Lungs are clear. No pleural effusion or pneumothorax is seen. Hiatal hernia, moderate to large in size. Osseous structures about the chest are unremarkable. IMPRESSION: 1. No active cardiopulmonary disease. No evidence of pneumonia or pulmonary edema. 2. Moderate to large hiatal hernia. Electronically Signed   By: SFranki CabotM.D.   On: 04/24/2022 12:35    EKG:  Independently reviewed.  Sinus, chronic LBBB  Assessment/Plan Principal Problem:   PNA (pneumonia) Active Problems:   COPD (chronic obstructive pulmonary disease) (HCC)   Acute bronchitis with asthma   CAP (community acquired pneumonia)  (please populate well all problems here in Problem List. (For example, if patient is on BP  meds at home and you resume or decide to hold them, it is a problem that needs to be her. Same for CAD, COPD, HLD and so on)  Sepsis -Evidenced by new onset of fever, tachycardia, elevated lactate reading and suspected infection source is the multi-focal PNA. -Continue ceftriaxone and doxy (allergy to Zithromax) -Check sputum culture and atypical PNA workup of legionella and mycoplasm -Other supportive care with bronchodilators, continue home dose of ICS and LABA -Incentive spirometry. -Completed 2 L of IV bolus, now appears to have tachycardia, will start maintenance IV fluid -Other Ddx, normal stress test last year, low suspicion for CHF. Patient already on maximal dosage of PPI for GERD, recommend she modify her eating behavior to avoid micro-aspiration.  Acute COPD exacerbation -Solu-Medrol x 1, IV magnesium x 1 -Solu-Medrol 40 mg every 8 -Continue ICS and LABA, DuoNeb every 6 hours plus as needed albuterol  Anxiety/depression -Continue SSRI -PRN Clonazepam  Restless leg syndrome -Continue Requip  Hiatal hernia and GERD -PPI bid and Pepcid  Migraines -Stable, continue PRN   DVT prophylaxis: Lovenox Code Status: Full code Family Communication: None at bedside Disposition Plan: Patient is sick with sepsis and multi-focal PNA requiring IV abx and concurrent COPD exacerbation failed outpatient management, requiring inpatient treatment, expect more than 2 midnight hospital stay Consults called: None Admission status: Tele admit   Lequita Halt MD [facility] Pager [phone]  04/24/2022, 3:32 PM

## 2022-04-25 DIAGNOSIS — J189 Pneumonia, unspecified organism: Secondary | ICD-10-CM | POA: Diagnosis not present

## 2022-04-25 LAB — CBC
HCT: 37.6 % (ref 36.0–46.0)
Hemoglobin: 11.8 g/dL — ABNORMAL LOW (ref 12.0–15.0)
MCH: 29 pg (ref 26.0–34.0)
MCHC: 31.4 g/dL (ref 30.0–36.0)
MCV: 92.4 fL (ref 80.0–100.0)
Platelets: 199 10*3/uL (ref 150–400)
RBC: 4.07 MIL/uL (ref 3.87–5.11)
RDW: 13.4 % (ref 11.5–15.5)
WBC: 15.2 10*3/uL — ABNORMAL HIGH (ref 4.0–10.5)
nRBC: 0 % (ref 0.0–0.2)

## 2022-04-25 LAB — BASIC METABOLIC PANEL
Anion gap: 7 (ref 5–15)
BUN: 11 mg/dL (ref 8–23)
CO2: 22 mmol/L (ref 22–32)
Calcium: 7 mg/dL — ABNORMAL LOW (ref 8.9–10.3)
Chloride: 101 mmol/L (ref 98–111)
Creatinine, Ser: 0.72 mg/dL (ref 0.44–1.00)
GFR, Estimated: >60 mL/min (ref >60)
Glucose, Bld: 214 mg/dL — ABNORMAL HIGH (ref 70–99)
Potassium: 4 mmol/L (ref 3.5–5.1)
Sodium: 130 mmol/L — ABNORMAL LOW (ref 135–145)

## 2022-04-25 LAB — HIV ANTIBODY (ROUTINE TESTING W REFLEX): HIV Screen 4th Generation wRfx: NONREACTIVE

## 2022-04-25 MED ORDER — IPRATROPIUM-ALBUTEROL 0.5-2.5 (3) MG/3ML IN SOLN
3.0000 mL | Freq: Four times a day (QID) | RESPIRATORY_TRACT | Status: DC
  Administered 2022-04-25 – 2022-04-26 (×6): 3 mL via RESPIRATORY_TRACT
  Filled 2022-04-25 (×6): qty 3

## 2022-04-25 MED ORDER — PREDNISONE 20 MG PO TABS
40.0000 mg | ORAL_TABLET | Freq: Every day | ORAL | Status: DC
  Administered 2022-04-26: 40 mg via ORAL
  Filled 2022-04-25: qty 2

## 2022-04-25 NOTE — Plan of Care (Signed)
  Problem: Activity: Goal: Ability to tolerate increased activity will improve Outcome: Progressing   Problem: Clinical Measurements: Goal: Ability to maintain a body temperature in the normal range will improve Outcome: Progressing   Problem: Respiratory: Goal: Ability to maintain adequate ventilation will improve Outcome: Progressing Goal: Ability to maintain a clear airway will improve Outcome: Progressing   Problem: Education: Goal: Knowledge of General Education information will improve Description: Including pain rating scale, medication(s)/side effects and non-pharmacologic comfort measures Outcome: Progressing   Problem: Clinical Measurements: Goal: Ability to maintain clinical measurements within normal limits will improve Outcome: Progressing Goal: Will remain free from infection Outcome: Progressing Goal: Diagnostic test results will improve Outcome: Progressing Goal: Respiratory complications will improve Outcome: Progressing Goal: Cardiovascular complication will be avoided Outcome: Progressing   Problem: Activity: Goal: Risk for activity intolerance will decrease Outcome: Progressing   Problem: Nutrition: Goal: Adequate nutrition will be maintained Outcome: Progressing   Problem: Coping: Goal: Level of anxiety will decrease Outcome: Progressing   Problem: Elimination: Goal: Will not experience complications related to bowel motility Outcome: Progressing Goal: Will not experience complications related to urinary retention Outcome: Progressing   Problem: Pain Managment: Goal: General experience of comfort will improve Outcome: Progressing   Problem: Safety: Goal: Ability to remain free from injury will improve Outcome: Progressing   Problem: Skin Integrity: Goal: Risk for impaired skin integrity will decrease Outcome: Progressing

## 2022-04-25 NOTE — Progress Notes (Signed)
PROGRESS NOTE  Summer Murphy OBS:[id number] DOB: 11/21/1955 DOA: 04/24/2022 PCP: Lavera Guise, MD  Hospital Course/Subjective: Summer Murphy is a 67 y.o. female with medical history significant of COPD/asthma Gold stage II, hiatal hernia, HTN, HLD, restless leg syndrome, anxiety/depression, migraines, came with worsening of dry cough, wheezing and SOB.  Imaging done in the emergency department showed evidence of multifocal pneumonia, she was meeting sepsis criteria on admission.  She was admitted to the hospitalist service and started on empiric IV Rocephin and doxycycline.  Patient seen and examined this morning, she is stable and comfortable on room air, no acute events overnight, has been afebrile since admission.  Says that she feels slightly winded, no significant cough.  Says that she definitely feels much better than when she came in yesterday morning.  Assessment/Plan:  Principal Problem: Sepsis: Meeting criteria at the time of admission with fever, tachycardia, elevated lactic acid and source is multifocal pneumonia.   PNA (pneumonia) -inpatient admission, continue empiric IV Rocephin and doxycycline Active Problems: Acute exacerbation of COPD (chronic obstructive pulmonary disease) (HCC) -de-escalate steroids, continue scheduled and as needed breathing treatments.  Patient is comfortable on room air   HTN (hypertension) -continue home Cardizem p.o.   Acute bronchitis with asthma   Polyneuropathy associated with underlying disease ([place])   Diastolic dysfunction   Type 2 diabetes mellitus with hyperglycemia ([place])   CAP (community acquired pneumonia) GERD-continue home PPI Zaidi/depression-continue SSRI Restless leg syndrome-continue home Requip   DVT Prophylaxis: Lovenox  Code Status: Full code  Family Communication: No family present at bedside this morning, patient is alert and oriented x 4.  Disposition Plan: Likely home at discharge, in the next 24 to 48  hours.  Consultants: None  Procedures: None  Antimicrobials: Anti-infectives (From admission, onward)    Start     Dose/Rate Route Frequency Ordered Stop   04/25/22 1000  cefTRIAXone (ROCEPHIN) 2 g in sodium chloride 0.9 % 100 mL IVPB        2 g 200 mL/hr over 30 Minutes Intravenous Every 24 hours 04/24/22 1506 04/30/22 0959   04/24/22 2200  doxycycline (VIBRAMYCIN) 100 mg in sodium chloride 0.9 % 250 mL IVPB        100 mg 125 mL/hr over 120 Minutes Intravenous Every 12 hours 04/24/22 1505     04/24/22 1230  doxycycline (VIBRAMYCIN) 100 mg in sodium chloride 0.9 % 250 mL IVPB        100 mg 125 mL/hr over 120 Minutes Intravenous  Once 04/24/22 1209 04/24/22 1518   04/24/22 1215  cefTRIAXone (ROCEPHIN) 1 g in sodium chloride 0.9 % 100 mL IVPB        1 g 200 mL/hr over 30 Minutes Intravenous  Once 04/24/22 1209 04/24/22 1314       Objective: Vitals:   04/24/22 2200 04/25/22 0127 04/25/22 0325 04/25/22 0754  BP:   120/68 (!) 163/83  Pulse:   79 70  Resp:   20 18  Temp:   (!) 97.5 F (36.4 C) 98.1 F (36.7 C)  TempSrc:   Oral Oral  SpO2:  93% 96% 98%  Weight: 76.2 kg     Height:        Intake/Output Summary (Last 24 hours) at 04/25/2022 1036 Last data filed at 04/25/2022 0955 Gross per 24 hour  Intake 3992.44 ml  Output 700 ml  Net 3292.44 ml   Filed Weights   04/24/22 1117 04/24/22 1910 04/24/22 2200  Weight: 74.4 kg 76.1 kg  76.2 kg   Exam: General:  Alert, oriented, calm, in no acute distress, nontoxic in appearance resting comfortably on room air this morning Eyes: EOMI, clear sclerea Neck: supple, no masses, trachea mildline  Cardiovascular: RRR, no murmurs or rubs, no peripheral edema  Respiratory: No tachypnea, no evidence of respiratory distress, good bilateral air entry, with diffuse rhonchi, no wheezing.  Speaking in full sentences with no cough. Abdomen: soft, nontender, nondistended, normal bowel tones heard  Skin: dry, no rashes  Musculoskeletal: no  joint effusions, normal range of motion  Psychiatric: appropriate affect, normal speech  Neurologic: extraocular muscles intact, clear speech, moving all extremities with intact sensorium   Data Reviewed: CBC: Recent Labs  Lab 04/24/22 1159 04/25/22 0426  WBC 14.7* 15.2*  NEUTROABS 13.4*  --   HGB 11.8* 11.8*  HCT 37.2 37.6  MCV 92.5 92.4  PLT 213 182   Basic Metabolic Panel: Recent Labs  Lab 04/24/22 1159 04/25/22 0426  NA 136 130*  K 3.7 4.0  CL 103 101  CO2 26 22  GLUCOSE 167* 214*  BUN 14 11  CREATININE 0.79 0.72  CALCIUM 8.2* 7.0*   GFR: Estimated Creatinine Clearance: 67.6 mL/min (by C-G formula based on SCr of 0.72 mg/dL). Liver Function Tests: Recent Labs  Lab 04/24/22 1159  AST 24  ALT 26  ALKPHOS 73  BILITOT 0.7  PROT 6.1*  ALBUMIN 3.5   No results for input(s): "LIPASE", "AMYLASE" in the last 168 hours. No results for input(s): "AMMONIA" in the last 168 hours. Coagulation Profile: Recent Labs  Lab 04/24/22 1159  INR 0.9   Cardiac Enzymes: No results for input(s): "CKTOTAL", "CKMB", "CKMBINDEX", "TROPONINI" in the last 168 hours. BNP (last 3 results) No results for input(s): "PROBNP" in the last 8760 hours. HbA1C: No results for input(s): "HGBA1C" in the last 72 hours. CBG: No results for input(s): "GLUCAP" in the last 168 hours. Lipid Profile: No results for input(s): "CHOL", "HDL", "LDLCALC", "TRIG", "CHOLHDL", "LDLDIRECT" in the last 72 hours. Thyroid Function Tests: No results for input(s): "TSH", "T4TOTAL", "FREET4", "T3FREE", "THYROIDAB" in the last 72 hours. Anemia Panel: No results for input(s): "VITAMINB12", "FOLATE", "FERRITIN", "TIBC", "IRON", "RETICCTPCT" in the last 72 hours. Urine analysis:    Component Value Date/Time   COLORURINE STRAW (A) 04/24/2022 1158   APPEARANCEUR CLEAR (A) 04/24/2022 1158   APPEARANCEUR Turbid (A) 09/18/2021 1546   LABSPEC 1.009 04/24/2022 1158   LABSPEC 1.011 06/28/2014 1724   PHURINE 5.0  04/24/2022 1158   GLUCOSEU 50 (A) 04/24/2022 1158   GLUCOSEU Negative 06/28/2014 1724   HGBUR MODERATE (A) 04/24/2022 1158   BILIRUBINUR NEGATIVE 04/24/2022 1158   BILIRUBINUR Negative 09/18/2021 1546   BILIRUBINUR Negative 06/28/2014 1724   KETONESUR NEGATIVE 04/24/2022 1158   PROTEINUR NEGATIVE 04/24/2022 1158   NITRITE NEGATIVE 04/24/2022 1158   LEUKOCYTESUR NEGATIVE 04/24/2022 1158   LEUKOCYTESUR Negative 06/28/2014 1724   Sepsis Labs: '@LABRCNTIP'$ (procalcitonin:4,lacticidven:4)  ) Recent Results (from the past 240 hour(s))  Culture, blood (Routine x 2)     Status: None (Preliminary result)   Collection Time: 04/24/22 11:59 AM   Specimen: BLOOD RIGHT WRIST  Result Value Ref Range Status   Specimen Description BLOOD RIGHT WRIST  Final   Special Requests   Final    BOTTLES DRAWN AEROBIC ONLY Blood Culture results may not be optimal due to an inadequate volume of blood received in culture bottles   Culture   Final    NO GROWTH < 24 HOURS Performed at [facility]  [facility], [address], [place], [place] [address]    Report Status PENDING  Incomplete  Culture, blood (Routine x 2)     Status: None (Preliminary result)   Collection Time: 04/24/22 11:59 AM   Specimen: BLOOD RIGHT ARM  Result Value Ref Range Status   Specimen Description BLOOD RIGHT ARM  Final   Special Requests   Final    BOTTLES DRAWN AEROBIC AND ANAEROBIC Blood Culture adequate volume   Culture   Final    NO GROWTH < 24 HOURS Performed at [facility], [address], [place], [place] [address]    Report Status PENDING  Incomplete  Resp panel by RT-PCR (RSV, Flu A&B, Covid) Anterior Nasal Swab     Status: None   Collection Time: 04/24/22 12:02 PM   Specimen: Anterior Nasal Swab  Result Value Ref Range Status   SARS Coronavirus 2 by RT PCR NEGATIVE NEGATIVE Final    Comment: (NOTE) SARS-CoV-2 target nucleic acids are NOT DETECTED.  The SARS-CoV-2 RNA is generally detectable in upper  respiratory specimens during the acute phase of infection. The lowest concentration of SARS-CoV-2 viral copies this assay can detect is 138 copies/mL. A negative result does not preclude SARS-Cov-2 infection and should not be used as the sole basis for treatment or other patient management decisions. A negative result may occur with  improper specimen collection/handling, submission of specimen other than nasopharyngeal swab, presence of viral mutation(s) within the areas targeted by this assay, and inadequate number of viral copies(<138 copies/mL). A negative result must be combined with clinical observations, patient history, and epidemiological information. The expected result is Negative.  Fact Sheet for Patients:  EntrepreneurPulse.com.au  Fact Sheet for Healthcare Providers:  IncredibleEmployment.be  This test is no t yet approved or cleared by the [place] FDA and  has been authorized for detection and/or diagnosis of SARS-CoV-2 by FDA under an Emergency Use Authorization (EUA). This EUA will remain  in effect (meaning this test can be used) for the duration of the COVID-19 declaration under Section 564(b)(1) of the Act, 21 U.S.C.section 360bbb-3(b)(1), unless the authorization is terminated  or revoked sooner.       Influenza A by PCR NEGATIVE NEGATIVE Final   Influenza B by PCR NEGATIVE NEGATIVE Final    Comment: (NOTE) The Xpert Xpress SARS-CoV-2/FLU/RSV plus assay is intended as an aid in the diagnosis of influenza from Nasopharyngeal swab specimens and should not be used as a sole basis for treatment. Nasal washings and aspirates are unacceptable for Xpert Xpress SARS-CoV-2/FLU/RSV testing.  Fact Sheet for Patients: EntrepreneurPulse.com.au  Fact Sheet for Healthcare Providers: IncredibleEmployment.be  This test is not yet approved or cleared by the [place] FDA and has been  authorized for detection and/or diagnosis of SARS-CoV-2 by FDA under an Emergency Use Authorization (EUA). This EUA will remain in effect (meaning this test can be used) for the duration of the COVID-19 declaration under Section 564(b)(1) of the Act, 21 U.S.C. section 360bbb-3(b)(1), unless the authorization is terminated or revoked.     Resp Syncytial Virus by PCR NEGATIVE NEGATIVE Final    Comment: (NOTE) Fact Sheet for Patients: EntrepreneurPulse.com.au  Fact Sheet for Healthcare Providers: IncredibleEmployment.be  This test is not yet approved or cleared by the [place] FDA and has been authorized for detection and/or diagnosis of SARS-CoV-2 by FDA under an Emergency Use Authorization (EUA). This EUA will remain in effect (meaning this test can be used) for the duration of the COVID-19 declaration under  Section 564(b)(1) of the Act, 21 U.S.C. section 360bbb-3(b)(1), unless the authorization is terminated or revoked.  Performed at [facility], [address] [place], [place] [address]      Studies: CT Angio Chest PE W/Cm &/Or Wo Cm  Result Date: 04/24/2022 CLINICAL DATA:  Pulmonary embolism suspected, high probability. Shortness of breath for a few weeks. EXAM: CT ANGIOGRAPHY CHEST WITH CONTRAST TECHNIQUE: Multidetector CT imaging of the chest was performed using the standard protocol during bolus administration of intravenous contrast. Multiplanar CT image reconstructions and MIPs were obtained to evaluate the vascular anatomy. RADIATION DOSE REDUCTION: This exam was performed according to the departmental dose-optimization program which includes automated exposure control, adjustment of the mA and/or kV according to patient size and/or use of iterative reconstruction technique. CONTRAST:  74m OMNIPAQUE IOHEXOL 350 MG/ML SOLN COMPARISON:  Chest CT dated 11/05/2011. FINDINGS: Cardiovascular: There is no pulmonary embolism  identified within the main, lobar or segmental pulmonary arteries bilaterally. No thoracic aortic aneurysm or evidence of aortic dissection. No pericardial effusion. Mediastinum/Nodes: No mass or enlarged lymph nodes are seen within the mediastinum. Large hiatal hernia. Surgical clips are seen at the gastroesophageal junction, presumably related to a previous hernia repair. Upper portion of the esophagus is unremarkable. Trachea and central bronchi are unremarkable. Lungs/Pleura: Clustered nodular and ground-glass consolidations are seen throughout the RIGHT upper lobe, RIGHT perihilar lung, RIGHT middle lobe and RIGHT lower lobe, with most prominent consolidation in the RIGHT infrahilar lung. LEFT lung is clear. No pleural effusion or pneumothorax. Upper Abdomen: No acute findings are seen on the limited images of the upper abdomen. Musculoskeletal: Degenerative spondylosis of the kyphotic thoracolumbar spine, at least moderate in degree. No acute-appearing osseous abnormality. Review of the MIP images confirms the above findings. IMPRESSION: 1. Diffuse nodular and ground-glass consolidations throughout the RIGHT lung, with most prominent consolidation in the RIGHT infrahilar lung. Findings are most consistent with multifocal pneumonia or aspiration, favor multifocal pneumonia. Differential includes atypical pneumonias such as viral or fungal, interstitial pneumonias, hypersensitivity pneumonitis, and respiratory bronchiolitis. 2. No pulmonary embolism seen. 3. Large hiatal hernia. Electronically Signed   By: SFranki CabotM.D.   On: 04/24/2022 14:12   DG Chest 2 View  Result Date: 04/24/2022 CLINICAL DATA:  Shortness of breath.  Suspected sepsis. EXAM: CHEST - 2 VIEW COMPARISON:  Chest x-ray dated 04/09/2022 and chest x-ray dated 03/08/2022. FINDINGS: Heart size and mediastinal contours are stable. Lungs are clear. No pleural effusion or pneumothorax is seen. Hiatal hernia, moderate to large in size. Osseous  structures about the chest are unremarkable. IMPRESSION: 1. No active cardiopulmonary disease. No evidence of pneumonia or pulmonary edema. 2. Moderate to large hiatal hernia. Electronically Signed   By: SFranki CabotM.D.   On: 04/24/2022 12:35    Scheduled Meds:  benzonatate  100 mg Oral BID   busPIRone  10 mg Oral BID   diltiazem  240 mg Oral Daily   DULoxetine  60 mg Oral QHS   enoxaparin (LOVENOX) injection  40 mg Subcutaneous Q24H   fluticasone  2 spray Each Nare Daily   fluticasone furoate-vilanterol  1 puff Inhalation Daily   gabapentin  600 mg Oral BID   guaiFENesin  600 mg Oral BID   ipratropium-albuterol  3 mL Nebulization Q6H   loratadine  10 mg Oral Daily   montelukast  10 mg Oral QHS   pantoprazole  40 mg Oral BID   [START ON 04/26/2022] predniSONE  40 mg Oral Q breakfast  rOPINIRole  0.5 mg Oral BID   senna-docusate  2 tablet Oral Daily   traZODone  150 mg Oral QHS    Continuous Infusions:  sodium chloride 125 mL/hr at 04/25/22 0410   cefTRIAXone (ROCEPHIN)  IV 2 g (04/25/22 0904)   doxycycline (VIBRAMYCIN) IV 100 mg (04/25/22 1001)     LOS: 1 day   Time spent: 31 minutes  Alvis Pulcini Neva Seat, MD [facility] Pager [phone]  If 7PM-7AM, please contact night-coverage [email or web address] Password [facility] 04/25/2022, 10:36 AM

## 2022-04-26 ENCOUNTER — Encounter: Payer: Self-pay | Admitting: Nurse Practitioner

## 2022-04-26 DIAGNOSIS — J189 Pneumonia, unspecified organism: Secondary | ICD-10-CM | POA: Diagnosis not present

## 2022-04-26 MED ORDER — BENZONATATE 100 MG PO CAPS: 100.0000 mg | ORAL_CAPSULE | Freq: Two times a day (BID) | ORAL | 0 refills | Status: DC

## 2022-04-26 MED ORDER — PREDNISONE 10 MG (21) PO TBPK: ORAL_TABLET | ORAL | 0 refills | Status: DC

## 2022-04-26 MED ORDER — DOXYCYCLINE HYCLATE 50 MG PO CAPS: 100.0000 mg | ORAL_CAPSULE | Freq: Two times a day (BID) | ORAL | 0 refills | Status: AC

## 2022-04-26 MED ORDER — AMOXICILLIN-POT CLAVULANATE 500-125 MG PO TABS: 1.0000 | ORAL_TABLET | Freq: Three times a day (TID) | ORAL | 0 refills | Status: AC

## 2022-04-26 MED ORDER — GUAIFENESIN ER 600 MG PO TB12: 600.0000 mg | ORAL_TABLET | Freq: Two times a day (BID) | ORAL | 0 refills | Status: AC

## 2022-04-26 MED ORDER — IPRATROPIUM-ALBUTEROL 0.5-2.5 (3) MG/3ML IN SOLN: 3.0000 mL | RESPIRATORY_TRACT | 0 refills | Status: DC | PRN

## 2022-04-26 NOTE — Plan of Care (Signed)
  Problem: Activity: Goal: Ability to tolerate increased activity will improve Outcome: Progressing   Problem: Clinical Measurements: Goal: Ability to maintain a body temperature in the normal range will improve Outcome: Progressing   Problem: Respiratory: Goal: Ability to maintain adequate ventilation will improve Outcome: Progressing Goal: Ability to maintain a clear airway will improve Outcome: Progressing   Problem: Education: Goal: Knowledge of General Education information will improve Description: Including pain rating scale, medication(s)/side effects and non-pharmacologic comfort measures Outcome: Progressing   Problem: Clinical Measurements: Goal: Ability to maintain clinical measurements within normal limits will improve Outcome: Progressing Goal: Will remain free from infection Outcome: Progressing Goal: Diagnostic test results will improve Outcome: Progressing Goal: Respiratory complications will improve Outcome: Progressing Goal: Cardiovascular complication will be avoided Outcome: Progressing   Problem: Activity: Goal: Risk for activity intolerance will decrease Outcome: Progressing   Problem: Nutrition: Goal: Adequate nutrition will be maintained Outcome: Progressing   Problem: Coping: Goal: Level of anxiety will decrease Outcome: Progressing   Problem: Elimination: Goal: Will not experience complications related to bowel motility Outcome: Progressing Goal: Will not experience complications related to urinary retention Outcome: Progressing   Problem: Pain Managment: Goal: General experience of comfort will improve Outcome: Progressing   Problem: Safety: Goal: Ability to remain free from injury will improve Outcome: Progressing   Problem: Skin Integrity: Goal: Risk for impaired skin integrity will decrease Outcome: Progressing

## 2022-04-26 NOTE — Discharge Summary (Signed)
Discharge Summary  Summer Murphy TML:[id number] DOB: 01/19/1956  PCP: Lavera Guise, MD  Admit date: 04/24/2022 Discharge date: 04/26/2022  Recommendations for Outpatient Follow-up:  Please follow up with your PCP with CBC and BMP in 1-2 weeks  Discharge Diagnoses:  Active Hospital Problems   Diagnosis Date Noted   PNA (pneumonia) 04/24/2022   CAP (community acquired pneumonia) 04/24/2022   Type 2 diabetes mellitus with hyperglycemia ([place]) 68/02/7516   Diastolic dysfunction 00/17/4944   Polyneuropathy associated with underlying disease ([place]) 04/22/2018   Acute bronchitis with asthma 07/01/2017   HTN (hypertension) 07/01/2017   COPD (chronic obstructive pulmonary disease) ([place]) 09/10/2014    Resolved Hospital Problems  No resolved problems to display.   Discharge Condition: Stable   Diet recommendation: Diet Orders (From admission, onward)     Start     Ordered   04/24/22 1505  Diet Heart Room service appropriate? Yes; Fluid consistency: Thin  Diet effective now       Question Answer Comment  Room service appropriate? Yes   Fluid consistency: Thin      04/24/22 1506           HPI and Brief Hospital Course:  This is a pleasant 67 year old female with a medical history significant for COPD/asthma Gold stage II, hiatal hernia, hypertension, hyperlipidemia, restless leg syndrome, on room air at baseline, anxiety/depression, migraines who was admitted to the hospital with worsening of dry cough, wheezing and shortness of breath.  Imaging done in the emergency department showed evidence of multifocal pneumonia, she was meeting sepsis criteria on admission which has now resolved.  She was admitted to the hospitalist service for multifocal community-acquired pneumonia and COPD exacerbation.  She was treated with empiric IV Rocephin as well as IV doxycycline.  She was given scheduled and as needed breathing treatments, as well as systemic steroids.  She improved very rapidly,  remained on room air.  Due to the severity of her multifocal pneumonia and sepsis, she was maintained in the hospital to ensure continued improvement.  Today, her pulmonary exam is normal, she has been ambulating in her room without difficulty on room air and is ready for discharge home from the hospital today.  Procedures: None  Consultations: None  Discharge details, plan of care and follow up instructions were discussed with patient and any available family or care providers. Patient and family are in agreement with discharge from the hospital today and all questions were answered to their satisfaction.  Discharge Exam: BP (!) 141/88 (BP Location: Left Arm)   Pulse 91   Temp 98.4 F (36.9 C) (Oral)   Resp 20   Ht '5\' 3"'$  (1.6 m)   Wt 76.2 kg   SpO2 99%   BMI 29.76 kg/m  General:  Alert, oriented, calm, in no acute distress speaking in full sentences without any dyspnea Eyes: EOMI, clear sclerea Neck: supple, no masses, trachea mildline  Cardiovascular: RRR, no murmurs or rubs, no peripheral edema  Respiratory: clear to auscultation bilaterally, no wheezes, no crackles, no more rhonchi Abdomen: soft, nontender, nondistended, normal bowel tones heard  Skin: dry, no rashes  Musculoskeletal: no joint effusions, normal range of motion  Psychiatric: appropriate affect, normal speech  Neurologic: extraocular muscles intact, clear speech, moving all extremities with intact sensorium   Discharge Instructions You were cared for by a hospitalist during your hospital stay. If you have any questions about your discharge medications or the care you received while you were in the hospital after you  are discharged, you can call the unit and asked to speak with the hospitalist on call if the hospitalist that took care of you is not available. Once you are discharged, your primary care physician will handle any further medical issues. Please note that NO REFILLS for any discharge medications will be  authorized once you are discharged, as it is imperative that you return to your primary care physician (or establish a relationship with a primary care physician if you do not have one) for your aftercare needs so that they can reassess your need for medications and monitor your lab values.   Allergies as of 04/26/2022       Reactions   Librium [chlordiazepoxide] Shortness Of Breath   Metoclopramide Hives, Other (See Comments)   hallucinations   Vanilla Shortness Of Breath   Aspirin Hives   Nsaids Rash   Rash/flares asthma issues.   Azithromycin Other (See Comments)   Unsure of what the reaction was.   Buprenorphine Hcl Other (See Comments)   Unsure of what the reaction was.   Chlordiazepoxide Hcl Other (See Comments)   unsure   Morphine Other (See Comments)   Unsure of reaction   Sulfa Antibiotics    Other reaction(s): Unknown   Tolmetin    Other Reaction: Allergy   Amlodipine Besylate Itching, Rash   arms, stomach and forehead   Iron Nausea And Vomiting   Other reaction(s): Unknown        Medication List     STOP taking these medications    acetaminophen 500 MG tablet Commonly known as: TYLENOL   famotidine 20 MG tablet Commonly known as: PEPCID   meclizine 25 MG tablet Commonly known as: ANTIVERT   metroNIDAZOLE 500 MG tablet Commonly known as: FLAGYL   nystatin 100000 UNIT/ML suspension Commonly known as: MYCOSTATIN   predniSONE 10 MG tablet Commonly known as: DELTASONE Replaced by: predniSONE 10 MG (21) Tbpk tablet   triamcinolone cream 0.1 % Commonly known as: KENALOG   valACYclovir 500 MG tablet Commonly known as: VALTREX       TAKE these medications    amoxicillin-clavulanate 500-125 MG tablet Commonly known as: Augmentin Take 1 tablet by mouth 3 (three) times daily for 7 days.   benzonatate 100 MG capsule Commonly known as: TESSALON Take 1 capsule (100 mg total) by mouth 2 (two) times daily.   busPIRone 10 MG tablet Commonly known  as: BUSPAR Take 1 tablet (10 mg total) by mouth 2 (two) times daily.   clonazePAM 1 MG tablet Commonly known as: KLONOPIN Take 0.5 tablets (0.5 mg total) by mouth 2 (two) times daily as needed for anxiety.   Combivent Respimat 20-100 MCG/ACT Aers respimat Generic drug: Ipratropium-Albuterol INHALE 1 PUFF INTO THE LUNGS EVERY 6 HOURS   ipratropium-albuterol 0.5-2.5 (3) MG/3ML Soln Commonly known as: DUONEB Take 3 mLs by nebulization every 4 (four) hours as needed (for shortness of breath/wheezing).   diclofenac Sodium 1 % Gel Commonly known as: VOLTAREN Apply 4 g topically 4 (four) times daily.   diltiazem 240 MG 24 hr capsule Commonly known as: Cartia XT Take 1 capsule (240 mg total) by mouth daily.   doxycycline 50 MG capsule Commonly known as: VIBRAMYCIN Take 2 capsules (100 mg total) by mouth 2 (two) times daily for 7 days.   DULoxetine 60 MG capsule Commonly known as: CYMBALTA Take 1 capsule (60 mg total) by mouth at bedtime.   EPINEPHrine 0.3 mg/0.3 mL Soaj injection Commonly known as: EPI-PEN Inject 0.3 mg  into the muscle as needed for anaphylaxis.   fluticasone 50 MCG/ACT nasal spray Commonly known as: FLONASE Place 2 sprays into both nostrils daily. What changed:  when to take this reasons to take this   fluticasone furoate-vilanterol 100-25 MCG/ACT Aepb Commonly known as: BREO ELLIPTA Inhale 1 puff into the lungs daily.   gabapentin 600 MG tablet Commonly known as: NEURONTIN Take 1 tablet (600 mg total) by mouth 2 (two) times daily.   guaiFENesin 600 MG 12 hr tablet Commonly known as: MUCINEX Take 1 tablet (600 mg total) by mouth 2 (two) times daily for 7 days.   hydrOXYzine 25 MG tablet Commonly known as: ATARAX Take 1-2 tablets (25-50 mg total) by mouth every 6 (six) hours as needed for itching.   loratadine 10 MG tablet Commonly known as: CLARITIN Take 1 tablet (10 mg total) by mouth daily. What changed:  when to take this reasons to take  this   montelukast 10 MG tablet Commonly known as: SINGULAIR Take 1 tablet (10 mg total) by mouth at bedtime.   ondansetron 4 MG tablet Commonly known as: ZOFRAN TAKE 1 TABLET BY MOUTH TWICE DAILY AS NEEDED   pantoprazole 40 MG tablet Commonly known as: PROTONIX Take 40 mg by mouth 2 (two) times daily.   pilocarpine 5 MG tablet Commonly known as: SALAGEN Take 5 mg by mouth 2 (two) times daily.   predniSONE 10 MG (21) Tbpk tablet Commonly known as: STERAPRED UNI-PAK 21 TAB Per package instructions Replaces: predniSONE 10 MG tablet   rOPINIRole 0.5 MG tablet Commonly known as: REQUIP Take two tablets po BID for restless legs.   sennosides-docusate sodium 8.6-50 MG tablet Commonly known as: SENOKOT-S Take 2 tablets by mouth daily. What changed:  when to take this reasons to take this   SUMAtriptan 100 MG tablet Commonly known as: IMITREX Take 1 tablet (100 mg total) by mouth every 2 (two) hours as needed (for migraine headaches.). May repeat in 1 hours if headache persists or recurs.   traZODone 150 MG tablet Commonly known as: DESYREL TAKE 1 TABLET(150 MG) BY MOUTH AT BEDTIME       Allergies  Allergen Reactions   Librium [Chlordiazepoxide] Shortness Of Breath   Metoclopramide Hives and Other (See Comments)    hallucinations    Vanilla Shortness Of Breath   Aspirin Hives   Nsaids Rash    Rash/flares asthma issues.   Azithromycin Other (See Comments)    Unsure of what the reaction was.   Buprenorphine Hcl Other (See Comments)    Unsure of what the reaction was.    Chlordiazepoxide Hcl Other (See Comments)    unsure   Morphine Other (See Comments)    Unsure of reaction   Sulfa Antibiotics     Other reaction(s): Unknown   Tolmetin     Other Reaction: Allergy   Amlodipine Besylate Itching and Rash    arms, stomach and forehead   Iron Nausea And Vomiting    Other reaction(s): Unknown    Follow-up Information     Lavera Guise, MD Follow up in 2  week(s).   Specialties: Internal Medicine, Cardiology Contact information: [place] [address] [phone]                 The results of significant diagnostics from this hospitalization (including imaging, microbiology, ancillary and laboratory) are listed below for reference.    Significant Diagnostic Studies: CT Angio Chest PE W/Cm &/Or Wo Cm  Result Date:  04/24/2022 CLINICAL DATA:  Pulmonary embolism suspected, high probability. Shortness of breath for a few weeks. EXAM: CT ANGIOGRAPHY CHEST WITH CONTRAST TECHNIQUE: Multidetector CT imaging of the chest was performed using the standard protocol during bolus administration of intravenous contrast. Multiplanar CT image reconstructions and MIPs were obtained to evaluate the vascular anatomy. RADIATION DOSE REDUCTION: This exam was performed according to the departmental dose-optimization program which includes automated exposure control, adjustment of the mA and/or kV according to patient size and/or use of iterative reconstruction technique. CONTRAST:  84m OMNIPAQUE IOHEXOL 350 MG/ML SOLN COMPARISON:  Chest CT dated 11/05/2011. FINDINGS: Cardiovascular: There is no pulmonary embolism identified within the main, lobar or segmental pulmonary arteries bilaterally. No thoracic aortic aneurysm or evidence of aortic dissection. No pericardial effusion. Mediastinum/Nodes: No mass or enlarged lymph nodes are seen within the mediastinum. Large hiatal hernia. Surgical clips are seen at the gastroesophageal junction, presumably related to a previous hernia repair. Upper portion of the esophagus is unremarkable. Trachea and central bronchi are unremarkable. Lungs/Pleura: Clustered nodular and ground-glass consolidations are seen throughout the RIGHT upper lobe, RIGHT perihilar lung, RIGHT middle lobe and RIGHT lower lobe, with most prominent consolidation in the RIGHT infrahilar lung. LEFT lung is clear. No pleural effusion or  pneumothorax. Upper Abdomen: No acute findings are seen on the limited images of the upper abdomen. Musculoskeletal: Degenerative spondylosis of the kyphotic thoracolumbar spine, at least moderate in degree. No acute-appearing osseous abnormality. Review of the MIP images confirms the above findings. IMPRESSION: 1. Diffuse nodular and ground-glass consolidations throughout the RIGHT lung, with most prominent consolidation in the RIGHT infrahilar lung. Findings are most consistent with multifocal pneumonia or aspiration, favor multifocal pneumonia. Differential includes atypical pneumonias such as viral or fungal, interstitial pneumonias, hypersensitivity pneumonitis, and respiratory bronchiolitis. 2. No pulmonary embolism seen. 3. Large hiatal hernia. Electronically Signed   By: SFranki CabotM.D.   On: 04/24/2022 14:12   DG Chest 2 View  Result Date: 04/24/2022 CLINICAL DATA:  Shortness of breath.  Suspected sepsis. EXAM: CHEST - 2 VIEW COMPARISON:  Chest x-ray dated 04/09/2022 and chest x-ray dated 03/08/2022. FINDINGS: Heart size and mediastinal contours are stable. Lungs are clear. No pleural effusion or pneumothorax is seen. Hiatal hernia, moderate to large in size. Osseous structures about the chest are unremarkable. IMPRESSION: 1. No active cardiopulmonary disease. No evidence of pneumonia or pulmonary edema. 2. Moderate to large hiatal hernia. Electronically Signed   By: SFranki CabotM.D.   On: 04/24/2022 12:35   DG Chest 2 View  Result Date: 04/11/2022 CLINICAL DATA:  SOB, decreased breath sounds, asthma exacerbation EXAM: CHEST - 2 VIEW COMPARISON:  03/08/2022 FINDINGS: Cardiac silhouette is unremarkable. No pneumothorax or pleural effusion. Retrocardiac opacity with air-fluid level consistent with hiatal hernia. Postop changes epigastric region. The lungs are clear. The visualized skeletal structures are unremarkable. IMPRESSION: No acute cardiopulmonary process. Electronically Signed   By: JSammie BenchM.D.   On: 04/11/2022 10:42    Microbiology: Recent Results (from the past 240 hour(s))  Culture, blood (Routine x 2)     Status: None (Preliminary result)   Collection Time: 04/24/22 11:59 AM   Specimen: BLOOD RIGHT WRIST  Result Value Ref Range Status   Specimen Description BLOOD RIGHT WRIST  Final   Special Requests   Final    BOTTLES DRAWN AEROBIC ONLY Blood Culture results may not be optimal due to an inadequate volume of blood received in culture bottles   Culture   Final  NO GROWTH 2 DAYS Performed at [facility], [address] [place], [place] [address]    Report Status PENDING  Incomplete  Culture, blood (Routine x 2)     Status: None (Preliminary result)   Collection Time: 04/24/22 11:59 AM   Specimen: BLOOD RIGHT ARM  Result Value Ref Range Status   Specimen Description BLOOD RIGHT ARM  Final   Special Requests   Final    BOTTLES DRAWN AEROBIC AND ANAEROBIC Blood Culture adequate volume   Culture   Final    NO GROWTH 2 DAYS Performed at [facility], [address], [place], [place] [address]    Report Status PENDING  Incomplete  Resp panel by RT-PCR (RSV, Flu A&B, Covid) Anterior Nasal Swab     Status: None   Collection Time: 04/24/22 12:02 PM   Specimen: Anterior Nasal Swab  Result Value Ref Range Status   SARS Coronavirus 2 by RT PCR NEGATIVE NEGATIVE Final    Comment: (NOTE) SARS-CoV-2 target nucleic acids are NOT DETECTED.  The SARS-CoV-2 RNA is generally detectable in upper respiratory specimens during the acute phase of infection. The lowest concentration of SARS-CoV-2 viral copies this assay can detect is 138 copies/mL. A negative result does not preclude SARS-Cov-2 infection and should not be used as the sole basis for treatment or other patient management decisions. A negative result may occur with  improper specimen collection/handling, submission of specimen other than nasopharyngeal swab, presence of viral  mutation(s) within the areas targeted by this assay, and inadequate number of viral copies(<138 copies/mL). A negative result must be combined with clinical observations, patient history, and epidemiological information. The expected result is Negative.  Fact Sheet for Patients:  EntrepreneurPulse.com.au  Fact Sheet for Healthcare Providers:  IncredibleEmployment.be  This test is no t yet approved or cleared by the [place] FDA and  has been authorized for detection and/or diagnosis of SARS-CoV-2 by FDA under an Emergency Use Authorization (EUA). This EUA will remain  in effect (meaning this test can be used) for the duration of the COVID-19 declaration under Section 564(b)(1) of the Act, 21 U.S.C.section 360bbb-3(b)(1), unless the authorization is terminated  or revoked sooner.       Influenza A by PCR NEGATIVE NEGATIVE Final   Influenza B by PCR NEGATIVE NEGATIVE Final    Comment: (NOTE) The Xpert Xpress SARS-CoV-2/FLU/RSV plus assay is intended as an aid in the diagnosis of influenza from Nasopharyngeal swab specimens and should not be used as a sole basis for treatment. Nasal washings and aspirates are unacceptable for Xpert Xpress SARS-CoV-2/FLU/RSV testing.  Fact Sheet for Patients: EntrepreneurPulse.com.au  Fact Sheet for Healthcare Providers: IncredibleEmployment.be  This test is not yet approved or cleared by the [place] FDA and has been authorized for detection and/or diagnosis of SARS-CoV-2 by FDA under an Emergency Use Authorization (EUA). This EUA will remain in effect (meaning this test can be used) for the duration of the COVID-19 declaration under Section 564(b)(1) of the Act, 21 U.S.C. section 360bbb-3(b)(1), unless the authorization is terminated or revoked.     Resp Syncytial Virus by PCR NEGATIVE NEGATIVE Final    Comment: (NOTE) Fact Sheet for  Patients: EntrepreneurPulse.com.au  Fact Sheet for Healthcare Providers: IncredibleEmployment.be  This test is not yet approved or cleared by the [place] FDA and has been authorized for detection and/or diagnosis of SARS-CoV-2 by FDA under an Emergency Use Authorization (EUA). This EUA will remain in effect (meaning this test can be used) for  the duration of the COVID-19 declaration under Section 564(b)(1) of the Act, 21 U.S.C. section 360bbb-3(b)(1), unless the authorization is terminated or revoked.  Performed at [facility], [address] [place], [place] [address]     Labs: Basic Metabolic Panel: Recent Labs  Lab 04/24/22 1159 04/25/22 0426  NA 136 130*  K 3.7 4.0  CL 103 101  CO2 26 22  GLUCOSE 167* 214*  BUN 14 11  CREATININE 0.79 0.72  CALCIUM 8.2* 7.0*   Liver Function Tests: Recent Labs  Lab 04/24/22 1159  AST 24  ALT 26  ALKPHOS 73  BILITOT 0.7  PROT 6.1*  ALBUMIN 3.5   No results for input(s): "LIPASE", "AMYLASE" in the last 168 hours. No results for input(s): "AMMONIA" in the last 168 hours. CBC: Recent Labs  Lab 04/24/22 1159 04/25/22 0426  WBC 14.7* 15.2*  NEUTROABS 13.4*  --   HGB 11.8* 11.8*  HCT 37.2 37.6  MCV 92.5 92.4  PLT 213 199   Cardiac Enzymes: No results for input(s): "CKTOTAL", "CKMB", "CKMBINDEX", "TROPONINI" in the last 168 hours. BNP: BNP (last 3 results) No results for input(s): "BNP" in the last 8760 hours.  ProBNP (last 3 results) No results for input(s): "PROBNP" in the last 8760 hours.  CBG: No results for input(s): "GLUCAP" in the last 168 hours.  Time spent: > 30 minutes were spent in preparing this discharge including medication reconciliation, counseling, and coordination of care.  Signed:  Nazyia Gaugh Neva Seat, MD  [facility] 04/26/2022, 10:12 AM

## 2022-04-27 LAB — MYCOPLASMA PNEUMONIAE ANTIBODY, IGM: Mycoplasma pneumo IgM: <770 U/mL (ref 0–769)

## 2022-04-28 ENCOUNTER — Ambulatory Visit: Payer: 59 | Admitting: Internal Medicine

## 2022-04-28 ENCOUNTER — Ambulatory Visit (INDEPENDENT_AMBULATORY_CARE_PROVIDER_SITE_OTHER): Payer: 59 | Admitting: Internal Medicine

## 2022-04-28 ENCOUNTER — Encounter: Payer: Self-pay | Admitting: Internal Medicine

## 2022-04-28 VITALS — BP 139/74 | HR 99 | Temp 98.2°F | Resp 16 | Ht 63.0 in | Wt 164.8 lb

## 2022-04-28 DIAGNOSIS — K449 Diaphragmatic hernia without obstruction or gangrene: Secondary | ICD-10-CM | POA: Diagnosis not present

## 2022-04-28 DIAGNOSIS — K219 Gastro-esophageal reflux disease without esophagitis: Secondary | ICD-10-CM | POA: Diagnosis not present

## 2022-04-28 DIAGNOSIS — J4541 Moderate persistent asthma with (acute) exacerbation: Secondary | ICD-10-CM

## 2022-04-28 LAB — LEGIONELLA PNEUMOPHILA SEROGP 1 UR AG: L. pneumophila Serogp 1 Ur Ag: NEGATIVE

## 2022-04-28 NOTE — Patient Instructions (Signed)
Gastroesophageal Reflux Disease, Adult  Gastroesophageal reflux (GER) happens when acid from the stomach flows up into the tube that connects the mouth and the stomach (esophagus). Normally, food travels down the esophagus and stays in the stomach to be digested. With GER, food and stomach acid sometimes move back up into the esophagus. You may have a disease called gastroesophageal reflux disease (GERD) if the reflux: Happens often. Causes frequent or very bad symptoms. Causes problems such as damage to the esophagus. When this happens, the esophagus becomes sore and swollen. Over time, GERD can make small holes (ulcers) in the lining of the esophagus. What are the causes? This condition is caused by a problem with the muscle between the esophagus and the stomach. When this muscle is weak or not normal, it does not close properly to keep food and acid from coming back up from the stomach. The muscle can be weak because of: Tobacco use. Pregnancy. Having a certain type of hernia (hiatal hernia). Alcohol use. Certain foods and drinks, such as coffee, chocolate, onions, and peppermint. What increases the risk? Being overweight. Having a disease that affects your connective tissue. Taking NSAIDs, such a ibuprofen. What are the signs or symptoms? Heartburn. Difficult or painful swallowing. The feeling of having a lump in the throat. A bitter taste in the mouth. Bad breath. Having a lot of saliva. Having an upset or bloated stomach. Burping. Chest pain. Different conditions can cause chest pain. Make sure you see your doctor if you have chest pain. Shortness of breath or wheezing. A long-term cough or a cough at night. Wearing away of the surface of teeth (tooth enamel). Weight loss. How is this treated? Making changes to your diet. Taking medicine. Having surgery. Treatment will depend on how bad your symptoms are. Follow these instructions at home: Eating and drinking  Follow a  diet as told by your doctor. You may need to avoid foods and drinks such as: Coffee and tea, with or without caffeine. Drinks that contain alcohol. Energy drinks and sports drinks. Bubbly (carbonated) drinks or sodas. Chocolate and cocoa. Peppermint and mint flavorings. Garlic and onions. Horseradish. Spicy and acidic foods. These include peppers, chili powder, curry powder, vinegar, hot sauces, and BBQ sauce. Citrus fruit juices and citrus fruits, such as oranges, lemons, and limes. Tomato-based foods. These include red sauce, chili, salsa, and pizza with red sauce. Fried and fatty foods. These include donuts, french fries, potato chips, and high-fat dressings. High-fat meats. These include hot dogs, rib eye steak, sausage, ham, and bacon. High-fat dairy items, such as whole milk, butter, and cream cheese. Eat small meals often. Avoid eating large meals. Avoid drinking large amounts of liquid with your meals. Avoid eating meals during the 2-3 hours before bedtime. Avoid lying down right after you eat. Do not exercise right after you eat. Lifestyle  Do not smoke or use any products that contain nicotine or tobacco. If you need help quitting, ask your doctor. Try to lower your stress. If you need help doing this, ask your doctor. If you are overweight, lose an amount of weight that is healthy for you. Ask your doctor about a safe weight loss goal. General instructions Pay attention to any changes in your symptoms. Take over-the-counter and prescription medicines only as told by your doctor. Do not take aspirin, ibuprofen, or other NSAIDs unless your doctor says it is okay. Wear loose clothes. Do not wear anything tight around your waist. Raise (elevate) the head of your bed about   6 inches (15 cm). You may need to use a wedge to do this. Avoid bending over if this makes your symptoms worse. Keep all follow-up visits. Contact a doctor if: You have new symptoms. You lose weight and you  do not know why. You have trouble swallowing or it hurts to swallow. You have wheezing or a cough that keeps happening. You have a hoarse voice. Your symptoms do not get better with treatment. Get help right away if: You have sudden pain in your arms, neck, jaw, teeth, or back. You suddenly feel sweaty, dizzy, or light-headed. You have chest pain or shortness of breath. You vomit and the vomit is green, yellow, or black, or it looks like blood or coffee grounds. You faint. Your poop (stool) is red, bloody, or black. You cannot swallow, drink, or eat. These symptoms may represent a serious problem that is an emergency. Do not wait to see if the symptoms will go away. Get medical help right away. Call your local emergency services (911 in the [place] Do not drive yourself to the hospital. Summary If a person has gastroesophageal reflux disease (GERD), food and stomach acid move back up into the esophagus and cause symptoms or problems such as damage to the esophagus. Treatment will depend on how bad your symptoms are. Follow a diet as told by your doctor. Take all medicines only as told by your doctor. This information is not intended to replace advice given to you by your health care provider. Make sure you discuss any questions you have with your health care provider. Document Revised: 09/18/2019 Document Reviewed: 09/18/2019 [employer] Patient Education  2023 [employer]  

## 2022-04-28 NOTE — Progress Notes (Signed)
[facility] [place],  [address]  Pulmonary Sleep Medicine   Office Visit Note  Patient Name: Summer Murphy DOB: January 07, 1956 MRN [id number]  Date of Service: 04/28/2022  Complaints/HPI: She was in the hospital discharged after 2 days. She did not have pneumonia noted on the CXR. She does have a large Hiatal Hernia. I suspect she has significant reflux probably triggering aspiration and her cough. Was seen by Dr Mariea Clonts for GI Scope. Found to have stenosis and large amount of stomach residue   01/07/2022 2:13 PM EDT  _______________________________________________________________________________ Patient Name: Summer Murphy         Procedure Date: 01/07/2022 1:40 PM MRN: [id number]                     Date of Birth: Sep 01, 1955 Admit Type: Outpatient                Age: 82 Room: [facility] OR 05 [facility]          Gender: Female Note Status: Finalized                Instrument Name: [id number] _______________________________________________________________________________  Procedure:             Upper GI endoscopy Indications:           Dysphagia, Surveillance after eradication of Barrett's                       esophagus - hx of LGD, For therapy of esophageal                       stricture Providers:             CRAIG Charlotta Newton, MD, LORI CHRISTENSEN, JODI                       Michel Santee, RN Referring MD:          Talmadge Chad, MD (Referring MD) Medicines:             Propofol per Anesthesia Complications:         No immediate complications. _______________________________________________________________________________ Procedure:             Pre-Anesthesia Assessment:                       - Prior to the procedure, a History and Physical was                       performed, and patient medications and allergies were                       reviewed. The patient's tolerance of previous                       anesthesia was also  reviewed. The risks and benefits                       of the procedure and the sedation options and risks                       were discussed with the patient. All questions were                       answered, and  informed consent was obtained. Prior                       Anticoagulants: The patient has taken no anticoagulant                       or antiplatelet agents. ASA Grade Assessment: III - A                       patient with severe systemic disease. After reviewing                       the risks and benefits, the patient was deemed in                       satisfactory condition to undergo the procedure.                       After obtaining informed consent, the endoscope was                       passed under direct vision. Throughout the procedure,                       the patient's blood pressure, pulse, and oxygen                       saturations were monitored continuously.The upper GI                       endoscopy was accomplished without difficulty. The                       patient tolerated the procedure well. The Endoscope                       was introduced through the mouth, and advanced to the                       second part of duodenum.                                                                               Findings:     One benign-appearing, intrinsic mild stenosis was found at the     gastroesophageal junction. This stenosis measured less than one cm (in     length). The stenosis was traversed. A TTS dilator was passed through     the scope. Dilation with an 18-19-20 mm balloon dilator was performed to     20 mm at the GEJ only. The dilation site was examined and showed no     change.     The esophagus and gastroesophageal junction were examined with white     light and narrow band imaging (NBI) from a forward view and retroflexed     position. There was no visual evidence of Barrett's esophagus. Mucosa     was biopsied with a cold forceps for  histology. A total  of 2 specimen     bottles were sent to pathology (jar 1 - high cardia; jar 2 - distal     esophagus).     Evidence of an anti-reflux surgical site was found in the cardia. This     was characterized by healthy appearing mucosa.     A large amount of food was found in the gastric body.     The examined duodenum was normal.                                                                               Impression:            - Benign-appearing esophageal stenosis. Dilated.                       - There is no endoscopic evidence of Barrett's                       esophagus. Biopsied.                       - An anti-reflux surgical site was found,                       characterized by healthy appearing mucosa.                       - A large amount of food (residue) in the stomach.                       - Normal examined duodenum. Recommendation:        - Patient has a contact number available for                       emergencies. The signs and symptoms of potential                       delayed complications were discussed with the patient.                       Return to normal activities tomorrow. Written                       discharge instructions were provided to the patient.                       - Continue present medications.                       - Resume previous diet.                       - Await pathology results.                       - Repeat upper endoscopy in 1 year for surveillance or  sooner if needed pending pathology results.                                                                               Procedure Code(s):     --- Professional ---                       [phone], Esophagogastroduodenoscopy, flexible,                       transoral; with transendoscopic balloon dilation of                       esophagus (less than 30 mm diameter) Diagnosis Code(s):     --- Professional ---                       K22.2, Esophageal  obstruction                       K22.70, Barrett's esophagus without dysplasia                       Z98.890, Other specified postprocedural states                       R13.10, Dysphagia, unspecified  CPT copyright 2022 American [employer] Association. All rights reserved.  The codes documented in this report are preliminary and upon coder review may be revised to meet current compliance requirements.  Electronically Signed By Michiel Cowboy, MD ______________________ Wonda Amis, MD 01/07/2022 2:12:15 PM The attending physician was present throughout the entire procedure including the insertion, viewing, and removal of the endoscope. This procedure note has been electronically signed by: Michiel Cowboy , MD Number of Addenda: 0  Note Initiated On: 01/07/2022 1:40 PM    Imaging Results - Upper Endoscopy (01/07/2022 1:40 PM EDT) Procedure Note  Wonda Amis, MD - 01/07/2022  Formatting of this note might be different from the original. _______________________________________________________________________________ Patient Name: Summer Murphy Procedure Date: 01/07/2022 1:40 PM MRN: [id number] Date of Birth: 03/08/1956 Admit Type: Outpatient Age: 32 Room: [facility] OR 05 [facility] Gender: Female Note Status: Finalized Instrument Name: [id number] _______________________________________________________________________________  Procedure: Upper GI endoscopy Indications: Dysphagia, Surveillance after eradication of Barrett's esophagus - hx of LGD, For therapy of esophageal stricture Providers: CRAIG Charlotta Newton, MD, LORI CHRISTENSEN, JODI Michel Santee, RN Referring MD: Talmadge Chad, MD (Referring MD) Medicines: Propofol per Anesthesia Complications: No immediate complications. _______________________________________________________________________________ Procedure: Pre-Anesthesia Assessment: - Prior to the procedure, a History and Physical was performed,  and patient medications and allergies were reviewed. The patient's tolerance of previous anesthesia was also reviewed. The risks and benefits of the procedure and the sedation options and risks were discussed with the patient. All questions were answered, and informed consent was obtained. Prior Anticoagulants: The patient has taken no anticoagulant or antiplatelet agents. ASA Grade Assessment: III - A patient with severe systemic disease. After reviewing the risks and benefits, the patient was deemed in satisfactory condition to undergo the procedure. After obtaining informed consent, the endoscope was passed under direct  vision. Throughout the procedure, the patient's blood pressure, pulse, and oxygen saturations were monitored continuously.The upper GI endoscopy was accomplished without difficulty. The patient tolerated the procedure well. The Endoscope was introduced through the mouth, and advanced to the second part of duodenum.  Findings: One benign-appearing, intrinsic mild stenosis was found at the gastroesophageal junction. This stenosis measured less than one cm (in length). The stenosis was traversed. A TTS dilator was passed through the scope. Dilation with an 18-19-20 mm balloon dilator was performed to 20 mm at the GEJ only. The dilation site was examined and showed no change. The esophagus and gastroesophageal junction were examined with white light and narrow band imaging (NBI) from a forward view and retroflexed position. There was no visual evidence of Barrett's esophagus. Mucosa was biopsied with a cold forceps for histology. A total of 2 specimen bottles were sent to pathology (jar 1 - high cardia; jar 2 - distal esophagus). Evidence of an anti-reflux surgical site was found in the cardia. This was characterized by healthy appearing mucosa. A large amount of food was found in the gastric body. The examined duodenum was normal.  Impression: - Benign-appearing  esophageal stenosis. Dilated. - There is no endoscopic evidence of Barrett's esophagus. Biopsied. - An anti-reflux surgical site was found, characterized by healthy appearing mucosa. - A large amount of food (residue) in the stomach. - Normal examined duodenum. Recommendation: - Patient has a contact number available for emergencies. The signs and symptoms of potential delayed complications were discussed with the patient. Return to normal activities tomorrow. Written discharge instructions were provided to the patient. - Continue present medications. - Resume previous diet. - Await pathology results. - Repeat upper endoscopy in 1 year for surveillance or sooner if needed pending pathology results.      ROS  General: (-) fever, (-) chills, (-) night sweats, (-) weakness Skin: (-) rashes, (-) itching,. Eyes: (-) visual changes, (-) redness, (-) itching. Nose and Sinuses: (-) nasal stuffiness or itchiness, (-) postnasal drip, (-) nosebleeds, (-) sinus trouble. Mouth and Throat: (-) sore throat, (-) hoarseness. Neck: (-) swollen glands, (-) enlarged thyroid, (-) neck pain. Respiratory: + cough, (-) bloody sputum, + shortness of breath, + wheezing. Cardiovascular: - ankle swelling, (-) chest pain. Lymphatic: (-) lymph node enlargement. Neurologic: (-) numbness, (-) tingling. Psychiatric: (-) anxiety, (-) depression   Current Medication: Outpatient Encounter Medications as of 04/28/2022  Medication Sig   amoxicillin-clavulanate (AUGMENTIN) 500-125 MG tablet Take 1 tablet by mouth 3 (three) times daily for 7 days.   benzonatate (TESSALON) 100 MG capsule Take 1 capsule (100 mg total) by mouth 2 (two) times daily.   busPIRone (BUSPAR) 10 MG tablet Take 1 tablet (10 mg total) by mouth 2 (two) times daily.   clonazePAM (KLONOPIN) 1 MG tablet Take 0.5 tablets (0.5 mg total) by mouth 2 (two) times daily as needed for anxiety.   diclofenac Sodium (VOLTAREN) 1 % GEL Apply 4 g topically 4  (four) times daily.   diltiazem (CARTIA XT) 240 MG 24 hr capsule Take 1 capsule (240 mg total) by mouth daily.   doxycycline (VIBRAMYCIN) 50 MG capsule Take 2 capsules (100 mg total) by mouth 2 (two) times daily for 7 days.   DULoxetine (CYMBALTA) 60 MG capsule Take 1 capsule (60 mg total) by mouth at bedtime.   EPINEPHrine 0.3 mg/0.3 mL IJ SOAJ injection Inject 0.3 mg into the muscle as needed for anaphylaxis.   fluticasone (FLONASE) 50 MCG/ACT nasal spray Place 2 sprays into  both nostrils daily. (Patient taking differently: Place 2 sprays into both nostrils daily as needed for rhinitis or allergies.)   fluticasone furoate-vilanterol (BREO ELLIPTA) 100-25 MCG/ACT AEPB Inhale 1 puff into the lungs daily.   gabapentin (NEURONTIN) 600 MG tablet Take 1 tablet (600 mg total) by mouth 2 (two) times daily.   guaiFENesin (MUCINEX) 600 MG 12 hr tablet Take 1 tablet (600 mg total) by mouth 2 (two) times daily for 7 days.   hydrOXYzine (ATARAX) 25 MG tablet Take 1-2 tablets (25-50 mg total) by mouth every 6 (six) hours as needed for itching.   Ipratropium-Albuterol (COMBIVENT RESPIMAT) 20-100 MCG/ACT AERS respimat INHALE 1 PUFF INTO THE LUNGS EVERY 6 HOURS   ipratropium-albuterol (DUONEB) 0.5-2.5 (3) MG/3ML SOLN Take 3 mLs by nebulization every 4 (four) hours as needed (for shortness of breath/wheezing).   loratadine (CLARITIN) 10 MG tablet Take 1 tablet (10 mg total) by mouth daily. (Patient taking differently: Take 10 mg by mouth daily as needed for allergies, rhinitis or itching.)   montelukast (SINGULAIR) 10 MG tablet Take 1 tablet (10 mg total) by mouth at bedtime.   ondansetron (ZOFRAN) 4 MG tablet TAKE 1 TABLET BY MOUTH TWICE DAILY AS NEEDED   pantoprazole (PROTONIX) 40 MG tablet Take 40 mg by mouth 2 (two) times daily.   pilocarpine (SALAGEN) 5 MG tablet Take 5 mg by mouth 2 (two) times daily.   predniSONE (STERAPRED UNI-PAK 21 TAB) 10 MG (21) TBPK tablet Per package instructions   rOPINIRole  (REQUIP) 0.5 MG tablet Take two tablets po BID for restless legs.   sennosides-docusate sodium (SENOKOT-S) 8.6-50 MG tablet Take 2 tablets by mouth daily. (Patient taking differently: Take 2 tablets by mouth daily as needed for constipation.)   SUMAtriptan (IMITREX) 100 MG tablet Take 1 tablet (100 mg total) by mouth every 2 (two) hours as needed (for migraine headaches.). May repeat in 1 hours if headache persists or recurs.   traZODone (DESYREL) 150 MG tablet TAKE 1 TABLET(150 MG) BY MOUTH AT BEDTIME   No facility-administered encounter medications on file as of 04/28/2022.    Surgical History: Past Surgical History:  Procedure Laterality Date   ABDOMINAL HYSTERECTOMY     AMPUTATION TOE Left 07/31/2016   Procedure: AMPUTATION TOE/MPJ 2nd toe;  Surgeon: Sharlotte Alamo, DPM;  Location: [facility] ORS;  Service: Podiatry;  Laterality: Left;   APPENDECTOMY  1990   BACK SURGERY     low back   BREAST SURGERY     bilateral breast reduction   CARDIAC ELECTROPHYSIOLOGY STUDY AND ABLATION     CHOLECYSTECTOMY  1990   COLONOSCOPY WITH PROPOFOL N/A 01/04/2017   Procedure: COLONOSCOPY WITH PROPOFOL;  Surgeon: Manya Silvas, MD;  Location: [facility] ENDOSCOPY;  Service: Endoscopy;  Laterality: N/A;   CORNEAL TRANSPLANT     ESOPHAGOGASTRODUODENOSCOPY N/A 04/09/2021   Procedure: ESOPHAGOGASTRODUODENOSCOPY (EGD);  Surgeon: Lin Landsman, MD;  Location: [facility] ENDOSCOPY;  Service: Gastroenterology;  Laterality: N/A;   ESOPHAGOGASTRODUODENOSCOPY (EGD) WITH PROPOFOL N/A 01/04/2017   Procedure: ESOPHAGOGASTRODUODENOSCOPY (EGD) WITH PROPOFOL;  Surgeon: Manya Silvas, MD;  Location: [facility] ENDOSCOPY;  Service: Endoscopy;  Laterality: N/A;   EXCISION BONE CYST Left 07/31/2016   Procedure: EXCISION BONE CYST/exostectomy 28124/left 2nd;  Surgeon: Sharlotte Alamo, DPM;  Location: [facility] ORS;  Service: Podiatry;  Laterality: Left;   EXTRACORPOREAL SHOCK WAVE LITHOTRIPSY Left 09/12/2015   Procedure: EXTRACORPOREAL SHOCK WAVE  LITHOTRIPSY (ESWL);  Surgeon: Hollice Espy, MD;  Location: [facility] ORS;  Service: Urology;  Laterality: Left;   FRACTURE  SURGERY     left foot   HARDWARE REMOVAL Left 11/14/2020   Procedure: HARDWARE REMOVAL;  Surgeon: Hessie Knows, MD;  Location: [facility] ORS;  Service: Orthopedics;  Laterality: Left;   Crete repair     Fundoplication   JOINT REPLACEMENT Bilateral 2013,2014   total knees   LAPAROSCOPIC HYSTERECTOMY     LITHOTRIPSY     periprosthetic supracondylar fracture of left femur  02/16/2020   [facility]   TONSILLECTOMY     TOTAL KNEE REVISION Right 11/14/2019   Procedure: Revision patella and tibial polyethylene;  Surgeon: Hessie Knows, MD;  Location: [facility] ORS;  Service: Orthopedics;  Laterality: Right;   URETEROSCOPY      Medical History: Past Medical History:  Diagnosis Date   Acid reflux    Anemia    Anxiety    Arrhythmia    treated with meds and has no current problems   Arthritis    most uncomfortable in knees   Asthma    uses several inhalers   Depression    Fever blister    Hematuria    History of kidney stones    Hypertension    Hypoglycemia    Left flank pain    Migraine    Pre-diabetes    Restless leg    Yeast vaginitis     Family History: Family History  Problem Relation Age of Onset   Hypertension Mother    Stroke Mother    Prostate cancer Father    Kidney Stones Father    Diabetes Brother    Hypertension Brother    Breast cancer Maternal Aunt    Kidney disease Neg Hx     Social History: Social History   Socioeconomic History   Marital status: Widowed    Spouse name: Not on file   Number of children: Not on file   Years of education: Not on file   Highest education level: Not on file  Occupational History   Not on file  Tobacco Use   Smoking status: Never    Passive exposure: Past   Smokeless tobacco: Never  Vaping Use   Vaping Use: Never used  Substance and Sexual Activity   Alcohol use: No   Drug use: No   Sexual  activity: Not on file  Other Topics Concern   Not on file  Social History Narrative   Not on file   Social Determinants of Health   Financial Resource Strain: Not on file  Food Insecurity: No Food Insecurity (04/24/2022)   Hunger Vital Sign    Worried About Running Out of Food in the Last Year: Never true    Ran Out of Food in the Last Year: Never true  Transportation Needs: No Transportation Needs (04/24/2022)   PRAPARE - Hydrologist (Medical): No    Lack of Transportation (Non-Medical): No  Physical Activity: Not on file  Stress: Not on file  Social Connections: Not on file  Intimate Partner Violence: Not At Risk (04/24/2022)   Humiliation, Afraid, Rape, and Kick questionnaire    Fear of Current or Ex-Partner: No    Emotionally Abused: No    Physically Abused: No    Sexually Abused: No    Vital Signs: Blood pressure 139/74, pulse 99, temperature 98.2 F (36.8 C), resp. rate 16, height '5\' 3"'$  (1.6 m), weight 164 lb 12.8 oz (74.8 kg), SpO2 94 %.  Examination: General Appearance: The patient is well-developed, well-nourished, and in no distress. Skin: Gross  inspection of skin unremarkable. Head: normocephalic, no gross deformities. Eyes: no gross deformities noted. ENT: ears appear grossly normal no exudates. Neck: Supple. No thyromegaly. No LAD. Respiratory: no rhonchi noted. Cardiovascular: Normal S1 and S2 without murmur or rub. Extremities: No cyanosis. pulses are equal. Neurologic: Alert and oriented. No involuntary movements.  LABS: Recent Results (from the past 2160 hour(s))  Urinalysis, Routine w reflex microscopic -Urine, Clean Catch     Status: Abnormal   Collection Time: 04/24/22 11:58 AM  Result Value Ref Range   Color, Urine STRAW (A) YELLOW   APPearance CLEAR (A) CLEAR   Specific Gravity, Urine 1.009 1.005 - 1.030   pH 5.0 5.0 - 8.0   Glucose, UA 50 (A) NEGATIVE mg/dL   Hgb urine dipstick MODERATE (A) NEGATIVE   Bilirubin Urine  NEGATIVE NEGATIVE   Ketones, ur NEGATIVE NEGATIVE mg/dL   Protein, ur NEGATIVE NEGATIVE mg/dL   Nitrite NEGATIVE NEGATIVE   Leukocytes,Ua NEGATIVE NEGATIVE   RBC / HPF 6-10 0 - 5 RBC/hpf   WBC, UA 0-5 0 - 5 WBC/hpf   Bacteria, UA RARE (A) NONE SEEN   Squamous Epithelial / HPF 0-5 0 - 5 /HPF   Mucus PRESENT     Comment: Performed at [facility], [address] [place], [place] [address]  Comprehensive metabolic panel     Status: Abnormal   Collection Time: 04/24/22 11:59 AM  Result Value Ref Range   Sodium 136 135 - 145 mmol/L   Potassium 3.7 3.5 - 5.1 mmol/L    Comment: HEMOLYSIS AT THIS LEVEL MAY AFFECT RESULT   Chloride 103 98 - 111 mmol/L   CO2 26 22 - 32 mmol/L   Glucose, Bld 167 (H) 70 - 99 mg/dL    Comment: Glucose reference range applies only to samples taken after fasting for at least 8 hours.   BUN 14 8 - 23 mg/dL   Creatinine, Ser 0.79 0.44 - 1.00 mg/dL   Calcium 8.2 (L) 8.9 - 10.3 mg/dL   Total Protein 6.1 (L) 6.5 - 8.1 g/dL   Albumin 3.5 3.5 - 5.0 g/dL   AST 24 15 - 41 U/L   ALT 26 0 - 44 U/L   Alkaline Phosphatase 73 38 - 126 U/L   Total Bilirubin 0.7 0.3 - 1.2 mg/dL   GFR, Estimated >60 >60 mL/min    Comment: (NOTE) Calculated using the CKD-EPI Creatinine Equation (2021)    Anion gap 7 5 - 15    Comment: Performed at [facility], [address] [place], [place] [address]  Lactic acid, plasma     Status: Abnormal   Collection Time: 04/24/22 11:59 AM  Result Value Ref Range   Lactic Acid, Venous 2.2 (HH) 0.5 - 1.9 mmol/L    Comment: CRITICAL RESULT CALLED TO, READ BACK BY AND VERIFIED WITH [facility] AT 1246 04/24/22 DAS Performed at [facility], [address] [place],  [address]   CBC with Differential     Status: Abnormal   Collection Time: 04/24/22 11:59 AM  Result Value Ref Range   WBC 14.7 (H) 4.0 - 10.5 K/uL   RBC 4.02 3.87 - 5.11 MIL/uL   Hemoglobin 11.8 (L) 12.0 - 15.0 g/dL   HCT 37.2 36.0 - 46.0 %    MCV 92.5 80.0 - 100.0 fL   MCH 29.4 26.0 - 34.0 pg   MCHC 31.7 30.0 - 36.0 g/dL   RDW 13.3 11.5 - 15.5 %   Platelets 213 150 -  400 K/uL   nRBC 0.0 0.0 - 0.2 %   Neutrophils Relative % 92 %   Neutro Abs 13.4 (H) 1.7 - 7.7 K/uL   Lymphocytes Relative 5 %   Lymphs Abs 0.8 0.7 - 4.0 K/uL   Monocytes Relative 2 %   Monocytes Absolute 0.3 0.1 - 1.0 K/uL   Eosinophils Relative 1 %   Eosinophils Absolute 0.1 0.0 - 0.5 K/uL   Basophils Relative 0 %   Basophils Absolute 0.0 0.0 - 0.1 K/uL   Immature Granulocytes 0 %   Abs Immature Granulocytes 0.06 0.00 - 0.07 K/uL    Comment: Performed at [facility], [address] [place], [place] [address]  Protime-INR     Status: None   Collection Time: 04/24/22 11:59 AM  Result Value Ref Range   Prothrombin Time 12.4 11.4 - 15.2 seconds   INR 0.9 0.8 - 1.2    Comment: (NOTE) INR goal varies based on device and disease states. Performed at [facility] [address] [place], [place] [address]   Culture, blood (Routine x 2)     Status: None (Preliminary result)   Collection Time: 04/24/22 11:59 AM   Specimen: BLOOD RIGHT WRIST  Result Value Ref Range   Specimen Description BLOOD RIGHT WRIST    Special Requests      BOTTLES DRAWN AEROBIC ONLY Blood Culture results may not be optimal due to an inadequate volume of blood received in culture bottles   Culture      NO GROWTH 4 DAYS Performed at [facility], [address], [place], [place] [address]    Report Status PENDING   Culture, blood (Routine x 2)     Status: None (Preliminary result)   Collection Time: 04/24/22 11:59 AM   Specimen: BLOOD RIGHT ARM  Result Value Ref Range   Specimen Description BLOOD RIGHT ARM    Special Requests      BOTTLES DRAWN AEROBIC AND ANAEROBIC Blood Culture adequate volume   Culture      NO GROWTH 4 DAYS Performed at [facility], [address] [place], [place] [address]    Report Status PENDING   Troponin I  (High Sensitivity)     Status: None   Collection Time: 04/24/22 11:59 AM  Result Value Ref Range   Troponin I (High Sensitivity) 7 <18 ng/L    Comment: (NOTE) Elevated high sensitivity troponin I (hsTnI) values and significant  changes across serial measurements may suggest ACS but many other  chronic and acute conditions are known to elevate hsTnI results.  Refer to the "Links" section for chest pain algorithms and additional  guidance. Performed at [facility], [address] [place], [place] [address]   Resp panel by RT-PCR (RSV, Flu A&B, Covid) Anterior Nasal Swab     Status: None   Collection Time: 04/24/22 12:02 PM   Specimen: Anterior Nasal Swab  Result Value Ref Range   SARS Coronavirus 2 by RT PCR NEGATIVE NEGATIVE    Comment: (NOTE) SARS-CoV-2 target nucleic acids are NOT DETECTED.  The SARS-CoV-2 RNA is generally detectable in upper respiratory specimens during the acute phase of infection. The lowest concentration of SARS-CoV-2 viral copies this assay can detect is 138 copies/mL. A negative result does not preclude SARS-Cov-2 infection and should not be used as the sole basis for treatment or other patient management decisions. A negative result may occur with  improper specimen collection/handling, submission of specimen other than nasopharyngeal swab, presence  of viral mutation(s) within the areas targeted by this assay, and inadequate number of viral copies(<138 copies/mL). A negative result must be combined with clinical observations, patient history, and epidemiological information. The expected result is Negative.  Fact Sheet for Patients:  EntrepreneurPulse.com.au  Fact Sheet for Healthcare Providers:  IncredibleEmployment.be  This test is no t yet approved or cleared by the [place] FDA and  has been authorized for detection and/or diagnosis of SARS-CoV-2 by FDA under an Emergency Use Authorization  (EUA). This EUA will remain  in effect (meaning this test can be used) for the duration of the COVID-19 declaration under Section 564(b)(1) of the Act, 21 U.S.C.section 360bbb-3(b)(1), unless the authorization is terminated  or revoked sooner.       Influenza A by PCR NEGATIVE NEGATIVE   Influenza B by PCR NEGATIVE NEGATIVE    Comment: (NOTE) The Xpert Xpress SARS-CoV-2/FLU/RSV plus assay is intended as an aid in the diagnosis of influenza from Nasopharyngeal swab specimens and should not be used as a sole basis for treatment. Nasal washings and aspirates are unacceptable for Xpert Xpress SARS-CoV-2/FLU/RSV testing.  Fact Sheet for Patients: EntrepreneurPulse.com.au  Fact Sheet for Healthcare Providers: IncredibleEmployment.be  This test is not yet approved or cleared by the [place] FDA and has been authorized for detection and/or diagnosis of SARS-CoV-2 by FDA under an Emergency Use Authorization (EUA). This EUA will remain in effect (meaning this test can be used) for the duration of the COVID-19 declaration under Section 564(b)(1) of the Act, 21 U.S.C. section 360bbb-3(b)(1), unless the authorization is terminated or revoked.     Resp Syncytial Virus by PCR NEGATIVE NEGATIVE    Comment: (NOTE) Fact Sheet for Patients: EntrepreneurPulse.com.au  Fact Sheet for Healthcare Providers: IncredibleEmployment.be  This test is not yet approved or cleared by the [place] FDA and has been authorized for detection and/or diagnosis of SARS-CoV-2 by FDA under an Emergency Use Authorization (EUA). This EUA will remain in effect (meaning this test can be used) for the duration of the COVID-19 declaration under Section 564(b)(1) of the Act, 21 U.S.C. section 360bbb-3(b)(1), unless the authorization is terminated or revoked.  Performed at [facility], [address] [place], [place]  [address]   Blood gas, venous     Status: Abnormal   Collection Time: 04/24/22 12:02 PM  Result Value Ref Range   pH, Ven 7.4 7.25 - 7.43   pCO2, Ven 41 (L) 44 - 60 mmHg   pO2, Ven 59 (H) 32 - 45 mmHg   Bicarbonate 25.4 20.0 - 28.0 mmol/L   Acid-Base Excess 0.5 0.0 - 2.0 mmol/L   O2 Saturation 88.3 %   Patient temperature 37.0    Collection site VEIN     Comment: Performed at [facility], [place], [place] [address]  Troponin I (High Sensitivity)     Status: None   Collection Time: 04/24/22  2:21 PM  Result Value Ref Range   Troponin I (High Sensitivity) 9 <18 ng/L    Comment: (NOTE) Elevated high sensitivity troponin I (hsTnI) values and significant  changes across serial measurements may suggest ACS but many other  chronic and acute conditions are known to elevate hsTnI results.  Refer to the "Links" section for chest pain algorithms and additional  guidance. Performed at [facility], [place], [place] [address]   Lactic acid, plasma     Status: None   Collection Time: 04/24/22  2:21 PM  Result  Value Ref Range   Lactic Acid, Venous 1.7 0.5 - 1.9 mmol/L    Comment: Performed at [facility], [address] [place], [place] [address]  Lactic acid, plasma     Status: Abnormal   Collection Time: 04/24/22  7:24 PM  Result Value Ref Range   Lactic Acid, Venous 2.3 (HH) 0.5 - 1.9 mmol/L    Comment: CRITICAL RESULT CALLED TO, READ BACK BY AND VERIFIED WITH MARSHA [facility] 04/24/2022 AT 2005 SRR Performed at [facility], [address], [place], [place] [address]   Mycoplasma pneumoniae antibody, IgM     Status: None   Collection Time: 04/24/22  7:24 PM  Result Value Ref Range   Mycoplasma pneumo IgM <770 0 - 769 U/mL    Comment: (NOTE)                             Negative            <770 Clinically significant amount of M. pneumoniae antibody not detected.                             Low Positive   770 -  73 M. pneumoniae specific IgM presumptively detected.  It is recommended that another sample be collected 1-2 weeks later to assure reactivity.                             Positive            >950 Highly significant amount of M. pneumoniae specific IgM antibody detected. Performed At: [facility] [place], [place] [id number] Rush Farmer MD HQ:[id number]   CBC     Status: Abnormal   Collection Time: 04/25/22  4:26 AM  Result Value Ref Range   WBC 15.2 (H) 4.0 - 10.5 K/uL   RBC 4.07 3.87 - 5.11 MIL/uL   Hemoglobin 11.8 (L) 12.0 - 15.0 g/dL   HCT 37.6 36.0 - 46.0 %   MCV 92.4 80.0 - 100.0 fL   MCH 29.0 26.0 - 34.0 pg   MCHC 31.4 30.0 - 36.0 g/dL   RDW 13.4 11.5 - 15.5 %   Platelets 199 150 - 400 K/uL   nRBC 0.0 0.0 - 0.2 %    Comment: Performed at [facility], [address], [place], [place] [address]  Basic metabolic panel     Status: Abnormal   Collection Time: 04/25/22  4:26 AM  Result Value Ref Range   Sodium 130 (L) 135 - 145 mmol/L   Potassium 4.0 3.5 - 5.1 mmol/L   Chloride 101 98 - 111 mmol/L   CO2 22 22 - 32 mmol/L   Glucose, Bld 214 (H) 70 - 99 mg/dL    Comment: Glucose reference range applies only to samples taken after fasting for at least 8 hours.   BUN 11 8 - 23 mg/dL   Creatinine, Ser 0.72 0.44 - 1.00 mg/dL   Calcium 7.0 (L) 8.9 - 10.3 mg/dL   GFR, Estimated >60 >60 mL/min    Comment: (NOTE) Calculated using the CKD-EPI Creatinine Equation (2021)    Anion gap 7 5 - 15    Comment: Performed at [facility], [address] [place], [place] [address]  HIV Antibody (routine testing w rflx)     Status: None   Collection Time:  04/25/22  4:26 AM  Result Value Ref Range   HIV Screen 4th Generation wRfx Non Reactive Non Reactive    Comment: Performed at [facility], [place] [address] [place],  [address]    Radiology: CT Angio Chest PE W/Cm &/Or Wo Cm  Result Date: 04/24/2022 CLINICAL DATA:  Pulmonary  embolism suspected, high probability. Shortness of breath for a few weeks. EXAM: CT ANGIOGRAPHY CHEST WITH CONTRAST TECHNIQUE: Multidetector CT imaging of the chest was performed using the standard protocol during bolus administration of intravenous contrast. Multiplanar CT image reconstructions and MIPs were obtained to evaluate the vascular anatomy. RADIATION DOSE REDUCTION: This exam was performed according to the departmental dose-optimization program which includes automated exposure control, adjustment of the mA and/or kV according to patient size and/or use of iterative reconstruction technique. CONTRAST:  27m OMNIPAQUE IOHEXOL 350 MG/ML SOLN COMPARISON:  Chest CT dated 11/05/2011. FINDINGS: Cardiovascular: There is no pulmonary embolism identified within the main, lobar or segmental pulmonary arteries bilaterally. No thoracic aortic aneurysm or evidence of aortic dissection. No pericardial effusion. Mediastinum/Nodes: No mass or enlarged lymph nodes are seen within the mediastinum. Large hiatal hernia. Surgical clips are seen at the gastroesophageal junction, presumably related to a previous hernia repair. Upper portion of the esophagus is unremarkable. Trachea and central bronchi are unremarkable. Lungs/Pleura: Clustered nodular and ground-glass consolidations are seen throughout the RIGHT upper lobe, RIGHT perihilar lung, RIGHT middle lobe and RIGHT lower lobe, with most prominent consolidation in the RIGHT infrahilar lung. LEFT lung is clear. No pleural effusion or pneumothorax. Upper Abdomen: No acute findings are seen on the limited images of the upper abdomen. Musculoskeletal: Degenerative spondylosis of the kyphotic thoracolumbar spine, at least moderate in degree. No acute-appearing osseous abnormality. Review of the MIP images confirms the above findings. IMPRESSION: 1. Diffuse nodular and ground-glass consolidations throughout the RIGHT lung, with most prominent consolidation in the RIGHT  infrahilar lung. Findings are most consistent with multifocal pneumonia or aspiration, favor multifocal pneumonia. Differential includes atypical pneumonias such as viral or fungal, interstitial pneumonias, hypersensitivity pneumonitis, and respiratory bronchiolitis. 2. No pulmonary embolism seen. 3. Large hiatal hernia. Electronically Signed   By: SFranki CabotM.D.   On: 04/24/2022 14:12   DG Chest 2 View  Result Date: 04/24/2022 CLINICAL DATA:  Shortness of breath.  Suspected sepsis. EXAM: CHEST - 2 VIEW COMPARISON:  Chest x-ray dated 04/09/2022 and chest x-ray dated 03/08/2022. FINDINGS: Heart size and mediastinal contours are stable. Lungs are clear. No pleural effusion or pneumothorax is seen. Hiatal hernia, moderate to large in size. Osseous structures about the chest are unremarkable. IMPRESSION: 1. No active cardiopulmonary disease. No evidence of pneumonia or pulmonary edema. 2. Moderate to large hiatal hernia. Electronically Signed   By: SFranki CabotM.D.   On: 04/24/2022 12:35    No results found.  CT Angio Chest PE W/Cm &/Or Wo Cm  Result Date: 04/24/2022 CLINICAL DATA:  Pulmonary embolism suspected, high probability. Shortness of breath for a few weeks. EXAM: CT ANGIOGRAPHY CHEST WITH CONTRAST TECHNIQUE: Multidetector CT imaging of the chest was performed using the standard protocol during bolus administration of intravenous contrast. Multiplanar CT image reconstructions and MIPs were obtained to evaluate the vascular anatomy. RADIATION DOSE REDUCTION: This exam was performed according to the departmental dose-optimization program which includes automated exposure control, adjustment of the mA and/or kV according to patient size and/or use of iterative reconstruction technique. CONTRAST:  711mOMNIPAQUE IOHEXOL 350 MG/ML SOLN COMPARISON:  Chest CT dated  11/05/2011. FINDINGS: Cardiovascular: There is no pulmonary embolism identified within the main, lobar or segmental pulmonary arteries  bilaterally. No thoracic aortic aneurysm or evidence of aortic dissection. No pericardial effusion. Mediastinum/Nodes: No mass or enlarged lymph nodes are seen within the mediastinum. Large hiatal hernia. Surgical clips are seen at the gastroesophageal junction, presumably related to a previous hernia repair. Upper portion of the esophagus is unremarkable. Trachea and central bronchi are unremarkable. Lungs/Pleura: Clustered nodular and ground-glass consolidations are seen throughout the RIGHT upper lobe, RIGHT perihilar lung, RIGHT middle lobe and RIGHT lower lobe, with most prominent consolidation in the RIGHT infrahilar lung. LEFT lung is clear. No pleural effusion or pneumothorax. Upper Abdomen: No acute findings are seen on the limited images of the upper abdomen. Musculoskeletal: Degenerative spondylosis of the kyphotic thoracolumbar spine, at least moderate in degree. No acute-appearing osseous abnormality. Review of the MIP images confirms the above findings. IMPRESSION: 1. Diffuse nodular and ground-glass consolidations throughout the RIGHT lung, with most prominent consolidation in the RIGHT infrahilar lung. Findings are most consistent with multifocal pneumonia or aspiration, favor multifocal pneumonia. Differential includes atypical pneumonias such as viral or fungal, interstitial pneumonias, hypersensitivity pneumonitis, and respiratory bronchiolitis. 2. No pulmonary embolism seen. 3. Large hiatal hernia. Electronically Signed   By: Franki Cabot M.D.   On: 04/24/2022 14:12   DG Chest 2 View  Result Date: 04/24/2022 CLINICAL DATA:  Shortness of breath.  Suspected sepsis. EXAM: CHEST - 2 VIEW COMPARISON:  Chest x-ray dated 04/09/2022 and chest x-ray dated 03/08/2022. FINDINGS: Heart size and mediastinal contours are stable. Lungs are clear. No pleural effusion or pneumothorax is seen. Hiatal hernia, moderate to large in size. Osseous structures about the chest are unremarkable. IMPRESSION: 1. No  active cardiopulmonary disease. No evidence of pneumonia or pulmonary edema. 2. Moderate to large hiatal hernia. Electronically Signed   By: Franki Cabot M.D.   On: 04/24/2022 12:35   DG Chest 2 View  Result Date: 04/11/2022 CLINICAL DATA:  SOB, decreased breath sounds, asthma exacerbation EXAM: CHEST - 2 VIEW COMPARISON:  03/08/2022 FINDINGS: Cardiac silhouette is unremarkable. No pneumothorax or pleural effusion. Retrocardiac opacity with air-fluid level consistent with hiatal hernia. Postop changes epigastric region. The lungs are clear. The visualized skeletal structures are unremarkable. IMPRESSION: No acute cardiopulmonary process. Electronically Signed   By: Sammie Bench M.D.   On: 04/11/2022 10:42      Assessment and Plan: Patient Active Problem List   Diagnosis Date Noted   CAP (community acquired pneumonia) 04/24/2022   PNA (pneumonia) 04/24/2022   Hypocalcemia 07/31/2021   Esophageal dysphagia    Benign esophageal stricture    S/P hardware removal 11/14/2020   IDA (iron deficiency anemia) 10/18/2020   Moderate persistent asthma with acute exacerbation 08/21/2020   S/P revision of total knee, right 11/14/2019   Seasonal allergic rhinitis due to pollen 09/20/2019   Callus of foot 08/13/2019   Candidal vaginitis 08/13/2019   Other symptoms and signs involving the nervous system 08/13/2019   Encounter for general adult medical examination with abnormal findings 08/13/2019   Restless leg syndrome 04/30/2019   Type 2 diabetes mellitus with hyperglycemia ([place]) 04/02/2019   Muscle cramps 22/04/5425   Diastolic dysfunction 09/13/7626   SOB (shortness of breath) 12/14/2018   Hiatal hernia with GERD 12/14/2018   Wheezing 12/14/2018   Impaired fasting glucose 12/14/2018   COPD with acute exacerbation ([place]) 09/28/2018   Acute upper respiratory infection 08/25/2018   Recurrent sinusitis 08/15/2018   Prediabetes 08/15/2018  Nonintractable headache 08/15/2018   Dysuria  08/15/2018   Intractable migraine without status migrainosus 04/22/2018   Nausea 04/22/2018   Polyneuropathy associated with underlying disease ([place]) 04/22/2018   Midline low back pain without sciatica 04/22/2018   Routine cervical smear 04/22/2018   HTN (hypertension) 07/01/2017   Acute bronchitis with asthma 07/01/2017   Allergic rhinitis 07/01/2017   Moderate asthma without complication 73/71/0626   Severe recurrent major depression without psychotic features ([place]) 09/10/2014   COPD (chronic obstructive pulmonary disease) ([place]) 09/10/2014   Calculi, ureter 04/26/2013   Corneal graft malfunction 08/17/2012   Calculus of kidney 94/85/4627   Renal colic 03/50/0938   Urge incontinence 04/26/2012   Diaphragmatic hernia 10/27/2010   Barrett esophagus 07/07/2010    1. Moderate persistent asthma with acute exacerbation Likely worsened by her GI issues. May be a candidate for GERD surgery  2. Large hiatal hernia As above I would consider GERD surgery  3. Gastroesophageal reflux disease without esophagitis Recent EGD done biopsy done no Barretts noted   General Counseling: I have discussed the findings of the evaluation and examination with Ayaana.  I have also discussed any further diagnostic evaluation thatmay be needed or ordered today. Jeyli verbalizes understanding of the findings of todays visit. We also reviewed her medications today and discussed drug interactions and side effects including but not limited excessive drowsiness and altered mental states. We also discussed that there is always a risk not just to her but also people around her. she has been encouraged to call the office with any questions or concerns that should arise related to todays visit.  Orders Placed This Encounter  Procedures   DG UGI W SMALL BOWEL    Standing Status:   Future    Standing Expiration Date:   04/29/2023    Order Specific Question:   Reason for Exam (SYMPTOM  OR DIAGNOSIS REQUIRED)    Answer:    GERD    Order Specific Question:   Preferred Imaging Location?    Answer:   [facility]     Time spent: 28  I have personally obtained a history, examined the patient, evaluated laboratory and imaging results, formulated the assessment and plan and placed orders.    Allyne Gee, MD [facility] Pulmonary and Critical Care Sleep medicine

## 2022-04-29 ENCOUNTER — Telehealth: Payer: Self-pay | Admitting: Internal Medicine

## 2022-04-29 ENCOUNTER — Ambulatory Visit: Payer: 59 | Admitting: Internal Medicine

## 2022-04-29 LAB — CULTURE, BLOOD (ROUTINE X 2)
Culture: NO GROWTH
Culture: NO GROWTH
Special Requests: ADEQUATE

## 2022-04-29 NOTE — Telephone Encounter (Signed)
Notified patient of UGI appointment, date, location and prep-Toni

## 2022-05-04 ENCOUNTER — Telehealth: Payer: Self-pay

## 2022-05-04 ENCOUNTER — Other Ambulatory Visit: Payer: Self-pay

## 2022-05-04 MED ORDER — FLUCONAZOLE 150 MG PO TABS: 150.0000 mg | ORAL_TABLET | Freq: Every day | ORAL | 0 refills | Status: DC

## 2022-05-04 NOTE — Telephone Encounter (Signed)
As per Summer Murphy send diflucan for 5 days for thrush and pt advised that we send  med

## 2022-05-06 ENCOUNTER — Ambulatory Visit (INDEPENDENT_AMBULATORY_CARE_PROVIDER_SITE_OTHER): Payer: Self-pay | Admitting: Nurse Practitioner

## 2022-05-06 ENCOUNTER — Encounter: Payer: Self-pay | Admitting: Nurse Practitioner

## 2022-05-06 ENCOUNTER — Other Ambulatory Visit: Payer: Self-pay | Admitting: Nurse Practitioner

## 2022-05-06 VITALS — BP 124/67 | HR 103 | Temp 97.5°F | Resp 16 | Ht 63.0 in | Wt 166.2 lb

## 2022-05-06 DIAGNOSIS — Z5189 Encounter for other specified aftercare: Secondary | ICD-10-CM

## 2022-05-06 DIAGNOSIS — Z760 Encounter for issue of repeat prescription: Secondary | ICD-10-CM

## 2022-05-06 DIAGNOSIS — K219 Gastro-esophageal reflux disease without esophagitis: Secondary | ICD-10-CM

## 2022-05-06 DIAGNOSIS — G63 Polyneuropathy in diseases classified elsewhere: Secondary | ICD-10-CM | POA: Diagnosis not present

## 2022-05-06 DIAGNOSIS — F418 Other specified anxiety disorders: Secondary | ICD-10-CM

## 2022-05-06 DIAGNOSIS — F3342 Major depressive disorder, recurrent, in full remission: Secondary | ICD-10-CM

## 2022-05-06 DIAGNOSIS — Z79899 Other long term (current) drug therapy: Secondary | ICD-10-CM

## 2022-05-06 DIAGNOSIS — K449 Diaphragmatic hernia without obstruction or gangrene: Secondary | ICD-10-CM

## 2022-05-06 DIAGNOSIS — J4541 Moderate persistent asthma with (acute) exacerbation: Secondary | ICD-10-CM

## 2022-05-06 MED ORDER — TRAZODONE HCL 150 MG PO TABS: ORAL_TABLET | ORAL | 2 refills | Status: DC

## 2022-05-06 MED ORDER — GABAPENTIN 600 MG PO TABS: 600.0000 mg | ORAL_TABLET | Freq: Two times a day (BID) | ORAL | 3 refills | Status: DC

## 2022-05-06 MED ORDER — NYSTATIN 100000 UNIT/ML MT SUSP: 5.0000 mL | Freq: Four times a day (QID) | OROMUCOSAL | 0 refills | Status: AC

## 2022-05-06 MED ORDER — MONTELUKAST SODIUM 10 MG PO TABS: 10.0000 mg | ORAL_TABLET | Freq: Every day | ORAL | 1 refills | Status: DC

## 2022-05-06 MED ORDER — BENZONATATE 100 MG PO CAPS: 100.0000 mg | ORAL_CAPSULE | Freq: Two times a day (BID) | ORAL | 0 refills | Status: DC

## 2022-05-06 MED ORDER — CLONAZEPAM 1 MG PO TABS: 0.5000 mg | ORAL_TABLET | Freq: Two times a day (BID) | ORAL | 1 refills | Status: DC | PRN

## 2022-05-06 MED ORDER — DULOXETINE HCL 60 MG PO CPEP: 60.0000 mg | ORAL_CAPSULE | Freq: Every day | ORAL | 1 refills | Status: DC

## 2022-05-06 MED ORDER — BUSPIRONE HCL 10 MG PO TABS: 10.0000 mg | ORAL_TABLET | Freq: Two times a day (BID) | ORAL | 2 refills | Status: DC

## 2022-05-06 NOTE — Progress Notes (Signed)
[facility] [place],  [address]  Internal MEDICINE  Office Visit Note  Patient Name: Summer Murphy  [id number]  [id number]  Date of Service: 05/06/2022  Chief Complaint  Patient presents with   Follow-up    Review results     HPI Summer Murphy presents for a follow-up visit for recent hospital stay for pneumonia Recent hospital stay Dx'd with Pneumonia,  Was treated in hospital, finishing antibiotic, feeling better.  Needs multiple med refills.  Reports anxiety and depression are controlled with current medications.     Current Medication: Outpatient Encounter Medications as of 05/06/2022  Medication Sig   diclofenac Sodium (VOLTAREN) 1 % GEL Apply 4 g topically 4 (four) times daily.   diltiazem (CARTIA XT) 240 MG 24 hr capsule Take 1 capsule (240 mg total) by mouth daily.   EPINEPHrine 0.3 mg/0.3 mL IJ SOAJ injection Inject 0.3 mg into the muscle as needed for anaphylaxis.   fluticasone furoate-vilanterol (BREO ELLIPTA) 100-25 MCG/ACT AEPB Inhale 1 puff into the lungs daily.   hydrOXYzine (ATARAX) 25 MG tablet Take 1-2 tablets (25-50 mg total) by mouth every 6 (six) hours as needed for itching.   Ipratropium-Albuterol (COMBIVENT RESPIMAT) 20-100 MCG/ACT AERS respimat INHALE 1 PUFF INTO THE LUNGS EVERY 6 HOURS   [EXPIRED] nystatin (MYCOSTATIN) 100000 UNIT/ML suspension Take 5 mLs (500,000 Units total) by mouth 4 (four) times daily for 5 days.   ondansetron (ZOFRAN) 4 MG tablet TAKE 1 TABLET BY MOUTH TWICE DAILY AS NEEDED   pantoprazole (PROTONIX) 40 MG tablet Take 40 mg by mouth 2 (two) times daily.   pilocarpine (SALAGEN) 5 MG tablet Take 5 mg by mouth 2 (two) times daily.   SUMAtriptan (IMITREX) 100 MG tablet Take 1 tablet (100 mg total) by mouth every 2 (two) hours as needed (for migraine headaches.). May repeat in 1 hours if headache persists or recurs.   [DISCONTINUED] benzonatate (TESSALON) 100 MG capsule Take 1 capsule (100 mg total) by mouth  2 (two) times daily.   [DISCONTINUED] busPIRone (BUSPAR) 10 MG tablet Take 1 tablet (10 mg total) by mouth 2 (two) times daily.   [DISCONTINUED] clonazePAM (KLONOPIN) 1 MG tablet Take 0.5 tablets (0.5 mg total) by mouth 2 (two) times daily as needed for anxiety.   [DISCONTINUED] DULoxetine (CYMBALTA) 60 MG capsule Take 1 capsule (60 mg total) by mouth at bedtime.   [DISCONTINUED] fluconazole (DIFLUCAN) 150 MG tablet Take 1 tablet (150 mg total) by mouth daily.   [DISCONTINUED] fluticasone (FLONASE) 50 MCG/ACT nasal spray Place 2 sprays into both nostrils daily. (Patient taking differently: Place 2 sprays into both nostrils daily as needed for rhinitis or allergies.)   [DISCONTINUED] gabapentin (NEURONTIN) 600 MG tablet Take 1 tablet (600 mg total) by mouth 2 (two) times daily.   [DISCONTINUED] ipratropium-albuterol (DUONEB) 0.5-2.5 (3) MG/3ML SOLN Take 3 mLs by nebulization every 4 (four) hours as needed (for shortness of breath/wheezing).   [DISCONTINUED] loratadine (CLARITIN) 10 MG tablet Take 1 tablet (10 mg total) by mouth daily. (Patient taking differently: Take 10 mg by mouth daily as needed for allergies, rhinitis or itching.)   [DISCONTINUED] montelukast (SINGULAIR) 10 MG tablet Take 1 tablet (10 mg total) by mouth at bedtime.   [DISCONTINUED] predniSONE (STERAPRED UNI-PAK 21 TAB) 10 MG (21) TBPK tablet Per package instructions   [DISCONTINUED] rOPINIRole (REQUIP) 0.5 MG tablet Take two tablets po BID for restless legs.   [DISCONTINUED] sennosides-docusate sodium (SENOKOT-S) 8.6-50 MG tablet Take 2 tablets by mouth daily. (Patient  taking differently: Take 2 tablets by mouth daily as needed for constipation.)   [DISCONTINUED] traZODone (DESYREL) 150 MG tablet TAKE 1 TABLET(150 MG) BY MOUTH AT BEDTIME   busPIRone (BUSPAR) 10 MG tablet Take 1 tablet (10 mg total) by mouth 2 (two) times daily.   clonazePAM (KLONOPIN) 1 MG tablet Take 0.5 tablets (0.5 mg total) by mouth 2 (two) times daily as  needed for anxiety.   DULoxetine (CYMBALTA) 60 MG capsule Take 1 capsule (60 mg total) by mouth at bedtime.   gabapentin (NEURONTIN) 600 MG tablet Take 1 tablet (600 mg total) by mouth 2 (two) times daily.   montelukast (SINGULAIR) 10 MG tablet Take 1 tablet (10 mg total) by mouth at bedtime.   traZODone (DESYREL) 150 MG tablet TAKE 1 TABLET(150 MG) BY MOUTH AT BEDTIME   [DISCONTINUED] benzonatate (TESSALON) 100 MG capsule Take 1 capsule (100 mg total) by mouth 2 (two) times daily.   No facility-administered encounter medications on file as of 05/06/2022.    Surgical History: Past Surgical History:  Procedure Laterality Date   ABDOMINAL HYSTERECTOMY     AMPUTATION TOE Left 07/31/2016   Procedure: AMPUTATION TOE/MPJ 2nd toe;  Surgeon: Sharlotte Alamo, DPM;  Location: [facility] ORS;  Service: Podiatry;  Laterality: Left;   APPENDECTOMY  1990   BACK SURGERY     low back   BREAST SURGERY     bilateral breast reduction   CARDIAC ELECTROPHYSIOLOGY STUDY AND ABLATION     CHOLECYSTECTOMY  1990   COLONOSCOPY WITH PROPOFOL N/A 01/04/2017   Procedure: COLONOSCOPY WITH PROPOFOL;  Surgeon: Manya Silvas, MD;  Location[facility] END[facility]COPY;  Service: Endoscopy;  Laterality: N/A;   CORNEAL TRANSPLANT     ESOPHAGOGASTRODUODENOSCOPY N/A 04/09/2021   Procedure: ESOPHAGOGASTRODUODENOSCOPY (EGD);  Surgeon: Lin Landsman, MD;  Location: [facility] ENDOSCOPY;  Service: Gastroenterology;  Laterality: N/A;   ESOPHAGOGASTRODUODENOSCOPY (EGD) WITH PROPOFOL N/A 01/04/2017   Procedure: ESOPHAGOGASTRODUODENOSCOPY (EGD) WITH PROPOFOL;  Surgeon: Manya Silvas, MD;  Location: [facility] ENDOSCOPY;  Service: Endoscopy;  Laterality: N/A;   EXCISION BONE CYST Left 07/31/2016   Procedure: EXCISION BONE CYST/exostectomy 28124/left 2nd;  Surgeon: Sharlotte Alamo, DPM;  Location: [facility] ORS;  Service: Podiatry;  Laterality: Left;   EXTRACORPOREAL SHOCK WAVE LITHOTRIPSY Left 09/12/2015   Procedure: EXTRACORPOREAL SHOCK WAVE LITHOTRIPSY  (ESWL);  Surgeon: Hollice Espy, MD;  Location: [facility] ORS;  Service: Urology;  Laterality: Left;   FRACTURE SURGERY     left foot   HARDWARE REMOVAL Left 11/14/2020   Procedure: HARDWARE REMOVAL;  Surgeon: Hessie Knows, MD;  Location: [facility] ORS;  Service: Orthopedics;  Laterality: Left;   [place] repair     Fundoplication   JOINT REPLACEMENT Bilateral 2013,2014   total knees   LAPAROSCOPIC HYSTERECTOMY     LITHOTRIPSY     periprosthetic supracondylar fracture of left femur  02/16/2020   [facility]   TONSILLECTOMY     TOTAL KNEE REVISION Right 11/14/2019   Procedure: Revision patella and tibial polyethylene;  Surgeon: Hessie Knows, MD;  Location: [facility] ORS;  Service: Orthopedics;  Laterality: Right;   URETEROSCOPY      Medical History: Past Medical History:  Diagnosis Date   Acid reflux    Anemia    Anxiety    Arrhythmia    treated with meds and has no current problems   Arthritis    most uncomfortable in knees   Asthma    uses several inhalers   Depression    Fever blister    Hematuria  History of kidney stones    Hypertension    Hypoglycemia    Left flank pain    Migraine    Pre-diabetes    Restless leg    Yeast vaginitis     Family History: Family History  Problem Relation Age of Onset   Hypertension Mother    Stroke Mother    Prostate cancer Father    Kidney Stones Father    Diabetes Brother    Hypertension Brother    Breast cancer Maternal Aunt    Kidney disease Neg Hx     Social History   Socioeconomic History   Marital status: Widowed    Spouse name: Not on file   Number of children: Not on file   Years of education: Not on file   Highest education level: Not on file  Occupational History   Not on file  Tobacco Use   Smoking status: Never    Passive exposure: Past   Smokeless tobacco: Never  Vaping Use   Vaping Use: Never used  Substance and Sexual Activity   Alcohol use: No   Drug use: No   Sexual activity: Not on file  Other Topics  Concern   Not on file  Social History Narrative   Not on file   Social Determinants of Health   Financial Resource Strain: Not on file  Food Insecurity: No Food Insecurity (05/13/2022)   Hunger Vital Sign    Worried About Running Out of Food in the Last Year: Never true    Ran Out of Food in the Last Year: Never true  Transportation Needs: No Transportation Needs (05/13/2022)   PRAPARE - Hydrologist (Medical): No    Lack of Transportation (Non-Medical): No  Physical Activity: Not on file  Stress: Not on file  Social Connections: Not on file  Intimate Partner Violence: Not At Risk (05/13/2022)   Humiliation, Afraid, Rape, and Kick questionnaire    Fear of Current or Ex-Partner: No    Emotionally Abused: No    Physically Abused: No    Sexually Abused: No      Review of Systems  Constitutional:  Positive for appetite change and fatigue.  HENT: Negative.    Respiratory:  Positive for chest tightness (improving) and shortness of breath (improving). Negative for cough and wheezing.   Cardiovascular: Negative.  Negative for chest pain and palpitations.    Vital Signs: BP 124/67   Pulse (!) 103   Temp (!) 97.5 F (36.4 C)   Resp 16   Ht '5\' 3"'$  (1.6 m)   Wt 166 lb 3.2 oz (75.4 kg)   SpO2 93%   BMI 29.44 kg/m    Physical Exam Vitals reviewed.  Constitutional:      General: She is not in acute distress.    Appearance: Normal appearance. She is not ill-appearing.  HENT:     Head: Normocephalic and atraumatic.  Eyes:     Pupils: Pupils are equal, round, and reactive to light.  Cardiovascular:     Rate and Rhythm: Normal rate and regular rhythm.     Heart sounds: Normal heart sounds. No murmur heard. Pulmonary:     Effort: Pulmonary effort is normal. No respiratory distress.     Breath sounds: Normal breath sounds. No wheezing.  Neurological:     Mental Status: She is alert and oriented to person, place, and time.  Psychiatric:        Mood  and Affect: Mood normal.  Behavior: Behavior normal.        Assessment/Plan: 1. Polyneuropathy associated with underlying disease ([place]) Continue gabapentin as prescribed.  - gabapentin (NEURONTIN) 600 MG tablet; Take 1 tablet (600 mg total) by mouth 2 (two) times daily.  Dispense: 60 tablet; Refill: 3  2. Situational anxiety Continue clonazepam and duloxetine as prescribed.  - clonazePAM (KLONOPIN) 1 MG tablet; Take 0.5 tablets (0.5 mg total) by mouth 2 (two) times daily as needed for anxiety.  Dispense: 30 tablet; Refill: 1 - DULoxetine (CYMBALTA) 60 MG capsule; Take 1 capsule (60 mg total) by mouth at bedtime.  Dispense: 90 capsule; Refill: 1  3. Recurrent major depressive disorder, in full remission ([place]) Continue duloxetine as prescribed - DULoxetine (CYMBALTA) 60 MG capsule; Take 1 capsule (60 mg total) by mouth at bedtime.  Dispense: 90 capsule; Refill: 1  4. Encounter for medication review Medication list reviewed and updated. Refills ordered - nystatin (MYCOSTATIN) 100000 UNIT/ML suspension; Take 5 mLs (500,000 Units total) by mouth 4 (four) times daily for 5 days.  Dispense: 120 mL; Refill: 0 - traZODone (DESYREL) 150 MG tablet; TAKE 1 TABLET(150 MG) BY MOUTH AT BEDTIME  Dispense: 30 tablet; Refill: 2 - gabapentin (NEURONTIN) 600 MG tablet; Take 1 tablet (600 mg total) by mouth 2 (two) times daily.  Dispense: 60 tablet; Refill: 3 - busPIRone (BUSPAR) 10 MG tablet; Take 1 tablet (10 mg total) by mouth 2 (two) times daily.  Dispense: 60 tablet; Refill: 2 - clonazePAM (KLONOPIN) 1 MG tablet; Take 0.5 tablets (0.5 mg total) by mouth 2 (two) times daily as needed for anxiety.  Dispense: 30 tablet; Refill: 1 - DULoxetine (CYMBALTA) 60 MG capsule; Take 1 capsule (60 mg total) by mouth at bedtime.  Dispense: 90 capsule; Refill: 1 - montelukast (SINGULAIR) 10 MG tablet; Take 1 tablet (10 mg total) by mouth at bedtime.  Dispense: 90 tablet; Refill: 1   General Counseling: Sundee  verbalizes understanding of the findings of todays visit and agrees with plan of treatment. I have discussed any further diagnostic evaluation that may be needed or ordered today. We also reviewed her medications today. she has been encouraged to call the office with any questions or concerns that should arise related to todays visit.    No orders of the defined types were placed in this encounter.   Meds ordered this encounter  Medications   nystatin (MYCOSTATIN) 100000 UNIT/ML suspension    Sig: Take 5 mLs (500,000 Units total) by mouth 4 (four) times daily for 5 days.    Dispense:  120 mL    Refill:  0    Fill today   traZODone (DESYREL) 150 MG tablet    Sig: TAKE 1 TABLET(150 MG) BY MOUTH AT BEDTIME    Dispense:  30 tablet    Refill:  2   gabapentin (NEURONTIN) 600 MG tablet    Sig: Take 1 tablet (600 mg total) by mouth 2 (two) times daily.    Dispense:  60 tablet    Refill:  3   busPIRone (BUSPAR) 10 MG tablet    Sig: Take 1 tablet (10 mg total) by mouth 2 (two) times daily.    Dispense:  60 tablet    Refill:  2    For future refills   DISCONTD: benzonatate (TESSALON) 100 MG capsule    Sig: Take 1 capsule (100 mg total) by mouth 2 (two) times daily.    Dispense:  20 capsule    Refill:  0   clonazePAM (  KLONOPIN) 1 MG tablet    Sig: Take 0.5 tablets (0.5 mg total) by mouth 2 (two) times daily as needed for anxiety.    Dispense:  30 tablet    Refill:  1    Please discontinue all previous orders for clonazepam. Fill this script today   DULoxetine (CYMBALTA) 60 MG capsule    Sig: Take 1 capsule (60 mg total) by mouth at bedtime.    Dispense:  90 capsule    Refill:  1    For future refills   montelukast (SINGULAIR) 10 MG tablet    Sig: Take 1 tablet (10 mg total) by mouth at bedtime.    Dispense:  90 tablet    Refill:  1    Return in about 1 month (around 06/04/2022) for F/U, Jazline Cumbee PCP.   Total time spent:30 Minutes Time spent includes review of chart, medications,  test results, and follow up plan with the patient.   [place] Controlled Substance Database was reviewed by me.  This patient was seen by Jonetta Osgood, FNP-C in collaboration with Dr. Clayborn Bigness as a part of collaborative care agreement.   Ena Demary R. Valetta Fuller, MSN, FNP-C Internal medicine

## 2022-05-07 ENCOUNTER — Inpatient Hospital Stay: Admission: RE | Admit: 2022-05-07 | Payer: 59 | Source: Ambulatory Visit

## 2022-05-11 ENCOUNTER — Encounter: Payer: Self-pay | Admitting: Nurse Practitioner

## 2022-05-11 ENCOUNTER — Ambulatory Visit (INDEPENDENT_AMBULATORY_CARE_PROVIDER_SITE_OTHER): Payer: Self-pay | Admitting: Nurse Practitioner

## 2022-05-11 ENCOUNTER — Telehealth: Payer: Self-pay | Admitting: Nurse Practitioner

## 2022-05-11 ENCOUNTER — Ambulatory Visit
Admission: RE | Admit: 2022-05-11 | Discharge: 2022-05-11 | Disposition: A | Discharge status: Home / Self Care | Payer: 59 | Source: Ambulatory Visit | Attending: Nurse Practitioner | Admitting: Nurse Practitioner

## 2022-05-11 ENCOUNTER — Other Ambulatory Visit: Payer: Self-pay | Admitting: Nurse Practitioner

## 2022-05-11 VITALS — BP 153/77 | HR 108 | Temp 97.8°F | Resp 16 | Ht 63.0 in | Wt 163.6 lb

## 2022-05-11 DIAGNOSIS — K449 Diaphragmatic hernia without obstruction or gangrene: Secondary | ICD-10-CM

## 2022-05-11 DIAGNOSIS — R0602 Shortness of breath: Secondary | ICD-10-CM | POA: Diagnosis present

## 2022-05-11 DIAGNOSIS — R0789 Other chest pain: Secondary | ICD-10-CM | POA: Diagnosis present

## 2022-05-11 DIAGNOSIS — Z8701 Personal history of pneumonia (recurrent): Secondary | ICD-10-CM

## 2022-05-11 MED ORDER — LEVOFLOXACIN 750 MG PO TABS: 750.0000 mg | ORAL_TABLET | Freq: Every day | ORAL | 0 refills | Status: DC

## 2022-05-11 MED ORDER — PREDNISONE 10 MG (21) PO TBPK: ORAL_TABLET | ORAL | 0 refills | Status: DC

## 2022-05-11 NOTE — Telephone Encounter (Signed)
IMPRESSION: 1. Regressed bilateral multilobar Bronchopneumonia since 04/24/2022. Residual scattered ground-glass opacity, and a vague new 3 cm area of peripheral left upper lobe ground-glass opacity. No pleural effusion. Per Fleischner Society Guidelines, recommend a non-contrast Chest CT at 6 months to confirm persistence, then additional non-contrast Chest CTs every 2 years until 5 years. If nodule grows or develops solid component(s), consider resection. These guidelines do not apply to immunocompromised patients and patients with cancer. Follow up in patients with significant comorbidities as clinically warranted. For lung cancer screening, adhere to Lung-RADS guidelines. Reference: Radiology. 2017; 284(1):228-43.  Called patient and Discussed stat CT chest results. Worsening pneumonia 2 week course of levofloxacin prescribed as well as 6 day prednisone taper.

## 2022-05-11 NOTE — Progress Notes (Signed)
Discussed results, see telephone encounter

## 2022-05-11 NOTE — Progress Notes (Signed)
[facility] [place], [place] [address]  Internal MEDICINE  Office Visit Note  Patient Name: Summer Murphy  [id number]  [id number]  Date of Service: 05/11/2022  Chief Complaint  Patient presents with   Acute Visit     HPI [name] presents for an acute sick visit for SOB and chest tightness  Symptoms started on Friday Feels chest tightness, feels like she cannot breathe or take in a full breath Tachypnea.  Sleeping in recliner, cannot breathe laying flat Recently hospitalized for pneumonia that was only seen on CT scan not chest xray. This occurred less than 3 weeks ago.  HR and BP elevated due to patient being SOB, anxious and having used rescue inhaler and nebulizer treatments as often as she can.     Current Medication:  Outpatient Encounter Medications as of 05/11/2022  Medication Sig   benzonatate (TESSALON) 100 MG capsule Take 1 capsule (100 mg total) by mouth 2 (two) times daily.   busPIRone (BUSPAR) 10 MG tablet Take 1 tablet (10 mg total) by mouth 2 (two) times daily.   clonazePAM (KLONOPIN) 1 MG tablet Take 0.5 tablets (0.5 mg total) by mouth 2 (two) times daily as needed for anxiety.   diclofenac Sodium (VOLTAREN) 1 % GEL Apply 4 g topically 4 (four) times daily.   diltiazem (CARTIA XT) 240 MG 24 hr capsule Take 1 capsule (240 mg total) by mouth daily.   DULoxetine (CYMBALTA) 60 MG capsule Take 1 capsule (60 mg total) by mouth at bedtime.   EPINEPHrine 0.3 mg/0.3 mL IJ SOAJ injection Inject 0.3 mg into the muscle as needed for anaphylaxis.   fluconazole (DIFLUCAN) 150 MG tablet Take 1 tablet (150 mg total) by mouth daily.   fluticasone (FLONASE) 50 MCG/ACT nasal spray Place 2 sprays into both nostrils daily. (Patient taking differently: Place 2 sprays into both nostrils daily as needed for rhinitis or allergies.)   fluticasone furoate-vilanterol (BREO ELLIPTA) 100-25 MCG/ACT AEPB Inhale 1 puff into the lungs daily.   gabapentin (NEURONTIN)  600 MG tablet Take 1 tablet (600 mg total) by mouth 2 (two) times daily.   hydrOXYzine (ATARAX) 25 MG tablet Take 1-2 tablets (25-50 mg total) by mouth every 6 (six) hours as needed for itching.   Ipratropium-Albuterol (COMBIVENT RESPIMAT) 20-100 MCG/ACT AERS respimat INHALE 1 PUFF INTO THE LUNGS EVERY 6 HOURS   ipratropium-albuterol (DUONEB) 0.5-2.5 (3) MG/3ML SOLN Take 3 mLs by nebulization every 4 (four) hours as needed (for shortness of breath/wheezing).   loratadine (CLARITIN) 10 MG tablet Take 1 tablet (10 mg total) by mouth daily. (Patient taking differently: Take 10 mg by mouth daily as needed for allergies, rhinitis or itching.)   montelukast (SINGULAIR) 10 MG tablet Take 1 tablet (10 mg total) by mouth at bedtime.   nystatin (MYCOSTATIN) 100000 UNIT/ML suspension Take 5 mLs (500,000 Units total) by mouth 4 (four) times daily for 5 days.   ondansetron (ZOFRAN) 4 MG tablet TAKE 1 TABLET BY MOUTH TWICE DAILY AS NEEDED   pantoprazole (PROTONIX) 40 MG tablet Take 40 mg by mouth 2 (two) times daily.   pilocarpine (SALAGEN) 5 MG tablet Take 5 mg by mouth 2 (two) times daily.   rOPINIRole (REQUIP) 0.5 MG tablet Take two tablets po BID for restless legs.   sennosides-docusate sodium (SENOKOT-S) 8.6-50 MG tablet Take 2 tablets by mouth daily. (Patient taking differently: Take 2 tablets by mouth daily as needed for constipation.)   SUMAtriptan (IMITREX) 100 MG tablet Take 1 tablet (100  mg total) by mouth every 2 (two) hours as needed (for migraine headaches.). May repeat in 1 hours if headache persists or recurs.   traZODone (DESYREL) 150 MG tablet TAKE 1 TABLET(150 MG) BY MOUTH AT BEDTIME   [DISCONTINUED] predniSONE (STERAPRED UNI-PAK 21 TAB) 10 MG (21) TBPK tablet Per package instructions   predniSONE (STERAPRED UNI-PAK 21 TAB) 10 MG (21) TBPK tablet Per package instructions   No facility-administered encounter medications on file as of 05/11/2022.      Medical History: Past Medical History:   Diagnosis Date   Acid reflux    Anemia    Anxiety    Arrhythmia    treated with meds and has no current problems   Arthritis    most uncomfortable in knees   Asthma    uses several inhalers   Depression    Fever blister    Hematuria    History of kidney stones    Hypertension    Hypoglycemia    Left flank pain    Migraine    Pre-diabetes    Restless leg    Yeast vaginitis      Vital Signs: BP (!) 153/77   Pulse (!) 108   Temp 97.8 F (36.6 C)   Resp 16   Ht 5' 3"$  (1.6 m)   Wt 163 lb 9.6 oz (74.2 kg)   SpO2 98%   BMI 28.98 kg/m    Review of Systems  Constitutional:  Positive for activity change and fatigue.  HENT: Negative.    Respiratory:  Positive for cough, chest tightness and shortness of breath. Negative for wheezing.   Cardiovascular:  Negative for chest pain and palpitations.  Gastrointestinal: Negative.   Psychiatric/Behavioral:  The patient is nervous/anxious (increased anxiety due to SOB).     Physical Exam Vitals reviewed.  Constitutional:      General: She is in acute distress.     Appearance: Normal appearance. She is ill-appearing.  HENT:     Head: Normocephalic and atraumatic.  Eyes:     Pupils: Pupils are equal, round, and reactive to light.  Cardiovascular:     Rate and Rhythm: Regular rhythm. Tachycardia present.     Heart sounds: Normal heart sounds, S1 normal and S2 normal. No murmur heard. Pulmonary:     Effort: Tachypnea, accessory muscle usage and respiratory distress (moderate distress, not severe) present.     Breath sounds: Decreased air movement present. Examination of the right-upper field reveals decreased breath sounds. Examination of the left-upper field reveals decreased breath sounds. Examination of the right-middle field reveals decreased breath sounds. Examination of the left-middle field reveals decreased breath sounds. Examination of the right-lower field reveals decreased breath sounds. Examination of the left-lower  field reveals decreased breath sounds. Decreased breath sounds present. No wheezing, rhonchi or rales.  Musculoskeletal:     Right lower leg: No edema.     Left lower leg: No edema.  Neurological:     Mental Status: She is alert and oriented to person, place, and time.  Psychiatric:        Mood and Affect: Mood normal.        Behavior: Behavior normal.       Assessment/Plan: 1. Chest tightness Stat CT chest ordered to assess for reoccurrence of pneumonia or other possible causes of chest tightness and SOB. Prednisone taper ordered to decrease inflammation and chest tightness.  - CT Chest Wo Contrast; Future - predniSONE (STERAPRED UNI-PAK 21 TAB) 10 MG (21) TBPK tablet; Per  package instructions  Dispense: 1 each; Refill: 0  2. SOB (shortness of breath) Stat CT chest ordered, prednisone taper ordered as well for chest tightness and SOB - CT Chest Wo Contrast; Future - predniSONE (STERAPRED UNI-PAK 21 TAB) 10 MG (21) TBPK tablet; Per package instructions  Dispense: 1 each; Refill: 0  3. Large hiatal hernia Has a large hiatal hernia that may be compressing a portion of her lungs  - CT Chest Wo Contrast; Future  4. History of recent pneumonia CT chest ordered to assess for worsening pneumonia or relapsing pneumonia. Discussed in detail what symptoms and severity would warrant going to ER or calling 911 and patient agreed that she will do so if the situation warrants it.  - CT Chest Wo Contrast; Future   General Counseling: Tieasha verbalizes understanding of the findings of todays visit and agrees with plan of treatment. I have discussed any further diagnostic evaluation that may be needed or ordered today. We also reviewed her medications today. she has been encouraged to call the office with any questions or concerns that should arise related to todays visit.    Counseling:    Orders Placed This Encounter  Procedures   CT Chest Wo Contrast    Meds ordered this encounter   Medications   predniSONE (STERAPRED UNI-PAK 21 TAB) 10 MG (21) TBPK tablet    Sig: Per package instructions    Dispense:  1 each    Refill:  0    Return if symptoms worsen or fail to improve, for if symptoms worsen, will go to hospital, ordered STAT CT CHEST.  [place] Controlled Substance Database was reviewed by me for overdose risk score (ORS)  Time spent:30 Minutes Time spent with patient included reviewing progress notes, labs, imaging studies, and discussing plan for follow up.   This patient was seen by Jonetta Osgood, FNP-C in collaboration with Dr. Clayborn Bigness as a part of collaborative care agreement.  Korben Carcione R. Valetta Fuller, MSN, FNP-C Internal Medicine

## 2022-05-12 ENCOUNTER — Emergency Department: Payer: 59

## 2022-05-12 ENCOUNTER — Other Ambulatory Visit: Payer: Self-pay

## 2022-05-12 ENCOUNTER — Observation Stay
Admission: EM | Admit: 2022-05-12 | Discharge: 2022-05-13 | Disposition: A | Discharge status: Home / Self Care | Payer: 59 | Attending: Hospitalist | Admitting: Hospitalist

## 2022-05-12 ENCOUNTER — Inpatient Hospital Stay: Payer: 59

## 2022-05-12 DIAGNOSIS — Z7722 Contact with and (suspected) exposure to environmental tobacco smoke (acute) (chronic): Secondary | ICD-10-CM | POA: Insufficient documentation

## 2022-05-12 DIAGNOSIS — Z1152 Encounter for screening for COVID-19: Secondary | ICD-10-CM | POA: Diagnosis not present

## 2022-05-12 DIAGNOSIS — J189 Pneumonia, unspecified organism: Principal | ICD-10-CM | POA: Diagnosis present

## 2022-05-12 DIAGNOSIS — J441 Chronic obstructive pulmonary disease with (acute) exacerbation: Secondary | ICD-10-CM | POA: Diagnosis present

## 2022-05-12 DIAGNOSIS — J4551 Severe persistent asthma with (acute) exacerbation: Secondary | ICD-10-CM | POA: Insufficient documentation

## 2022-05-12 DIAGNOSIS — R739 Hyperglycemia, unspecified: Secondary | ICD-10-CM | POA: Insufficient documentation

## 2022-05-12 DIAGNOSIS — J301 Allergic rhinitis due to pollen: Secondary | ICD-10-CM

## 2022-05-12 DIAGNOSIS — I10 Essential (primary) hypertension: Secondary | ICD-10-CM | POA: Insufficient documentation

## 2022-05-12 DIAGNOSIS — I160 Hypertensive urgency: Secondary | ICD-10-CM

## 2022-05-12 DIAGNOSIS — A419 Sepsis, unspecified organism: Secondary | ICD-10-CM | POA: Diagnosis not present

## 2022-05-12 DIAGNOSIS — J45901 Unspecified asthma with (acute) exacerbation: Principal | ICD-10-CM

## 2022-05-12 DIAGNOSIS — R0600 Dyspnea, unspecified: Secondary | ICD-10-CM | POA: Diagnosis present

## 2022-05-12 DIAGNOSIS — R0609 Other forms of dyspnea: Secondary | ICD-10-CM | POA: Diagnosis present

## 2022-05-12 DIAGNOSIS — Z96653 Presence of artificial knee joint, bilateral: Secondary | ICD-10-CM | POA: Diagnosis not present

## 2022-05-12 DIAGNOSIS — Z79899 Other long term (current) drug therapy: Secondary | ICD-10-CM | POA: Diagnosis not present

## 2022-05-12 DIAGNOSIS — Z760 Encounter for issue of repeat prescription: Secondary | ICD-10-CM

## 2022-05-12 HISTORY — DX: Hypertensive urgency: I16.0

## 2022-05-12 HISTORY — DX: Sepsis, unspecified organism: A41.9

## 2022-05-12 LAB — GLUCOSE, CAPILLARY: Glucose-Capillary: 261 mg/dL — ABNORMAL HIGH (ref 70–99)

## 2022-05-12 LAB — CBC WITH DIFFERENTIAL/PLATELET
Abs Immature Granulocytes: 0.04 10*3/uL (ref 0.00–0.07)
Basophils Absolute: 0 10*3/uL (ref 0.0–0.1)
Basophils Relative: 0 %
Eosinophils Absolute: 0 10*3/uL (ref 0.0–0.5)
Eosinophils Relative: 0 %
HCT: 42.2 % (ref 36.0–46.0)
Hemoglobin: 13.6 g/dL (ref 12.0–15.0)
Immature Granulocytes: 0 %
Lymphocytes Relative: 6 %
Lymphs Abs: 0.8 10*3/uL (ref 0.7–4.0)
MCH: 28.4 pg (ref 26.0–34.0)
MCHC: 32.2 g/dL (ref 30.0–36.0)
MCV: 88.1 fL (ref 80.0–100.0)
Monocytes Absolute: 0.2 10*3/uL (ref 0.1–1.0)
Monocytes Relative: 1 %
Neutro Abs: 12.6 10*3/uL — ABNORMAL HIGH (ref 1.7–7.7)
Neutrophils Relative %: 93 %
Platelets: 316 10*3/uL (ref 150–400)
RBC: 4.79 MIL/uL (ref 3.87–5.11)
RDW: 13.2 % (ref 11.5–15.5)
WBC: 13.5 10*3/uL — ABNORMAL HIGH (ref 4.0–10.5)
nRBC: 0 % (ref 0.0–0.2)

## 2022-05-12 LAB — COMPREHENSIVE METABOLIC PANEL
ALT: 49 U/L — ABNORMAL HIGH (ref 0–44)
AST: 26 U/L (ref 15–41)
Albumin: 3.9 g/dL (ref 3.5–5.0)
Alkaline Phosphatase: 107 U/L (ref 38–126)
Anion gap: 13 (ref 5–15)
BUN: 23 mg/dL (ref 8–23)
CO2: 25 mmol/L (ref 22–32)
Calcium: 10.4 mg/dL — ABNORMAL HIGH (ref 8.9–10.3)
Chloride: 98 mmol/L (ref 98–111)
Creatinine, Ser: 0.81 mg/dL (ref 0.44–1.00)
GFR, Estimated: >60 mL/min (ref >60)
Glucose, Bld: 225 mg/dL — ABNORMAL HIGH (ref 70–99)
Potassium: 4.3 mmol/L (ref 3.5–5.1)
Sodium: 136 mmol/L (ref 135–145)
Total Bilirubin: 0.6 mg/dL (ref 0.3–1.2)
Total Protein: 7.5 g/dL (ref 6.5–8.1)

## 2022-05-12 LAB — BLOOD GAS, VENOUS
Acid-Base Excess: 3.2 mmol/L — ABNORMAL HIGH (ref 0.0–2.0)
Bicarbonate: 26.9 mmol/L (ref 20.0–28.0)
O2 Saturation: 91.4 %
Patient temperature: 37
pCO2, Ven: 37 mmHg — ABNORMAL LOW (ref 44–60)
pH, Ven: 7.47 — ABNORMAL HIGH (ref 7.25–7.43)
pO2, Ven: 63 mmHg — ABNORMAL HIGH (ref 32–45)

## 2022-05-12 LAB — TROPONIN I (HIGH SENSITIVITY)
Troponin I (High Sensitivity): 7 ng/L (ref <18)
Troponin I (High Sensitivity): 5 ng/L (ref <18)

## 2022-05-12 LAB — RESP PANEL BY RT-PCR (RSV, FLU A&B, COVID)  RVPGX2
Influenza A by PCR: NEGATIVE
Influenza B by PCR: NEGATIVE
Resp Syncytial Virus by PCR: NEGATIVE
SARS Coronavirus 2 by RT PCR: NEGATIVE

## 2022-05-12 LAB — LACTIC ACID, PLASMA
Lactic Acid, Venous: 2.6 mmol/L (ref 0.5–1.9)
Lactic Acid, Venous: 1.7 mmol/L (ref 0.5–1.9)

## 2022-05-12 LAB — BRAIN NATRIURETIC PEPTIDE: B Natriuretic Peptide: 40.9 pg/mL (ref 0.0–100.0)

## 2022-05-12 LAB — PROCALCITONIN: Procalcitonin: 0.26 ng/mL

## 2022-05-12 LAB — D-DIMER, QUANTITATIVE: D-Dimer, Quant: 0.67 ug/mL-FEU — ABNORMAL HIGH (ref 0.00–0.50)

## 2022-05-12 LAB — LIPASE, BLOOD: Lipase: 25 U/L (ref 11–51)

## 2022-05-12 MED ORDER — ALBUTEROL SULFATE (2.5 MG/3ML) 0.083% IN NEBU: 2.5000 mg | INHALATION_SOLUTION | RESPIRATORY_TRACT | Status: DC | PRN

## 2022-05-12 MED ORDER — VANCOMYCIN HCL IN DEXTROSE 1-5 GM/200ML-% IV SOLN: 1000.0000 mg | Freq: Once | INTRAVENOUS | Status: DC

## 2022-05-12 MED ORDER — ENOXAPARIN SODIUM 40 MG/0.4ML IJ SOSY
40.0000 mg | PREFILLED_SYRINGE | INTRAMUSCULAR | Status: DC
  Administered 2022-05-12: 40 mg via SUBCUTANEOUS
  Filled 2022-05-12: qty 0.4

## 2022-05-12 MED ORDER — BENZONATATE 100 MG PO CAPS
100.0000 mg | ORAL_CAPSULE | Freq: Two times a day (BID) | ORAL | Status: DC
  Administered 2022-05-12 – 2022-05-13 (×2): 100 mg via ORAL
  Filled 2022-05-12 (×2): qty 1

## 2022-05-12 MED ORDER — BUSPIRONE HCL 10 MG PO TABS
10.0000 mg | ORAL_TABLET | Freq: Two times a day (BID) | ORAL | Status: DC
  Administered 2022-05-12 – 2022-05-13 (×2): 10 mg via ORAL
  Filled 2022-05-12 (×2): qty 1

## 2022-05-12 MED ORDER — INSULIN ASPART 100 UNIT/ML IJ SOLN
0.0000 [IU] | Freq: Every day | INTRAMUSCULAR | Status: DC
  Administered 2022-05-12: 3 [IU] via SUBCUTANEOUS
  Filled 2022-05-12: qty 1

## 2022-05-12 MED ORDER — VANCOMYCIN HCL 1250 MG/250ML IV SOLN: 1250.0000 mg | INTRAVENOUS | Status: DC

## 2022-05-12 MED ORDER — SODIUM CHLORIDE 0.9 % IV SOLN
2.0000 g | Freq: Three times a day (TID) | INTRAVENOUS | Status: DC
  Administered 2022-05-13: 2 g via INTRAVENOUS
  Filled 2022-05-12: qty 2
  Filled 2022-05-12: qty 12.5

## 2022-05-12 MED ORDER — DILTIAZEM HCL ER COATED BEADS 120 MG PO CP24
240.0000 mg | ORAL_CAPSULE | Freq: Every day | ORAL | Status: DC
  Administered 2022-05-13: 240 mg via ORAL
  Filled 2022-05-12: qty 1
  Filled 2022-05-12 (×2): qty 2

## 2022-05-12 MED ORDER — ONDANSETRON HCL 4 MG PO TABS: 4.0000 mg | ORAL_TABLET | Freq: Four times a day (QID) | ORAL | Status: DC | PRN

## 2022-05-12 MED ORDER — SODIUM CHLORIDE 0.9 % IV BOLUS
1000.0000 mL | Freq: Once | INTRAVENOUS | Status: AC
  Administered 2022-05-12: 1000 mL via INTRAVENOUS

## 2022-05-12 MED ORDER — VANCOMYCIN HCL 1500 MG/300ML IV SOLN
1500.0000 mg | Freq: Once | INTRAVENOUS | Status: AC
  Administered 2022-05-12: 1500 mg via INTRAVENOUS
  Filled 2022-05-12: qty 300

## 2022-05-12 MED ORDER — HYDROXYZINE HCL 25 MG PO TABS: 25.0000 mg | ORAL_TABLET | Freq: Four times a day (QID) | ORAL | Status: DC | PRN

## 2022-05-12 MED ORDER — METHYLPREDNISOLONE SODIUM SUCC 125 MG IJ SOLR
125.0000 mg | Freq: Once | INTRAMUSCULAR | Status: AC
  Administered 2022-05-12: 125 mg via INTRAVENOUS
  Filled 2022-05-12: qty 2

## 2022-05-12 MED ORDER — METHYLPREDNISOLONE SODIUM SUCC 40 MG IJ SOLR
40.0000 mg | Freq: Two times a day (BID) | INTRAMUSCULAR | Status: AC
  Administered 2022-05-12 – 2022-05-13 (×2): 40 mg via INTRAVENOUS
  Filled 2022-05-12 (×2): qty 1

## 2022-05-12 MED ORDER — GABAPENTIN 300 MG PO CAPS
600.0000 mg | ORAL_CAPSULE | Freq: Two times a day (BID) | ORAL | Status: DC
  Administered 2022-05-12 – 2022-05-13 (×2): 600 mg via ORAL
  Filled 2022-05-12 (×2): qty 2

## 2022-05-12 MED ORDER — HYDROCODONE-ACETAMINOPHEN 5-325 MG PO TABS
1.0000 | ORAL_TABLET | ORAL | Status: DC | PRN
  Administered 2022-05-13: 1 via ORAL
  Filled 2022-05-12: qty 1

## 2022-05-12 MED ORDER — IPRATROPIUM-ALBUTEROL 20-100 MCG/ACT IN AERS: 1.0000 | INHALATION_SPRAY | Freq: Four times a day (QID) | RESPIRATORY_TRACT | Status: DC

## 2022-05-12 MED ORDER — SODIUM CHLORIDE 0.9 % IV SOLN
2.0000 g | Freq: Once | INTRAVENOUS | Status: AC
  Administered 2022-05-12: 2 g via INTRAVENOUS
  Filled 2022-05-12: qty 12.5

## 2022-05-12 MED ORDER — PANTOPRAZOLE SODIUM 40 MG PO TBEC
40.0000 mg | DELAYED_RELEASE_TABLET | Freq: Two times a day (BID) | ORAL | Status: DC
  Administered 2022-05-12 – 2022-05-13 (×2): 40 mg via ORAL
  Filled 2022-05-12 (×2): qty 1

## 2022-05-12 MED ORDER — IPRATROPIUM-ALBUTEROL 0.5-2.5 (3) MG/3ML IN SOLN
3.0000 mL | Freq: Four times a day (QID) | RESPIRATORY_TRACT | Status: DC
  Administered 2022-05-12 – 2022-05-13 (×3): 3 mL via RESPIRATORY_TRACT
  Filled 2022-05-12 (×3): qty 3

## 2022-05-12 MED ORDER — IPRATROPIUM-ALBUTEROL 0.5-2.5 (3) MG/3ML IN SOLN
3.0000 mL | Freq: Once | RESPIRATORY_TRACT | Status: AC
  Administered 2022-05-12: 3 mL via RESPIRATORY_TRACT
  Filled 2022-05-12: qty 3

## 2022-05-12 MED ORDER — ONDANSETRON HCL 4 MG/2ML IJ SOLN: 4.0000 mg | Freq: Four times a day (QID) | INTRAMUSCULAR | Status: DC | PRN

## 2022-05-12 MED ORDER — PREDNISONE 20 MG PO TABS: 40.0000 mg | ORAL_TABLET | Freq: Every day | ORAL | Status: DC

## 2022-05-12 MED ORDER — DULOXETINE HCL 30 MG PO CPEP
60.0000 mg | ORAL_CAPSULE | Freq: Every day | ORAL | Status: DC
  Administered 2022-05-12: 60 mg via ORAL
  Filled 2022-05-12: qty 2

## 2022-05-12 MED ORDER — ACETAMINOPHEN 650 MG RE SUPP: 650.0000 mg | Freq: Four times a day (QID) | RECTAL | Status: DC | PRN

## 2022-05-12 MED ORDER — TRAZODONE HCL 50 MG PO TABS
150.0000 mg | ORAL_TABLET | Freq: Every day | ORAL | Status: DC
  Administered 2022-05-12: 150 mg via ORAL
  Filled 2022-05-12: qty 1

## 2022-05-12 MED ORDER — FLUTICASONE PROPIONATE 50 MCG/ACT NA SUSP: 2.0000 | Freq: Every day | NASAL | Status: DC | PRN

## 2022-05-12 MED ORDER — INSULIN ASPART 100 UNIT/ML IJ SOLN
0.0000 [IU] | Freq: Three times a day (TID) | INTRAMUSCULAR | Status: DC
  Administered 2022-05-13: 2 [IU] via SUBCUTANEOUS
  Filled 2022-05-12: qty 1

## 2022-05-12 MED ORDER — IPRATROPIUM BROMIDE 0.02 % IN SOLN: 0.5000 mg | Freq: Four times a day (QID) | RESPIRATORY_TRACT | Status: DC

## 2022-05-12 MED ORDER — CLONAZEPAM 0.5 MG PO TABS
0.5000 mg | ORAL_TABLET | Freq: Two times a day (BID) | ORAL | Status: DC | PRN
  Administered 2022-05-13: 0.5 mg via ORAL
  Filled 2022-05-12: qty 1

## 2022-05-12 MED ORDER — ROPINIROLE HCL 1 MG PO TABS
0.5000 mg | ORAL_TABLET | Freq: Three times a day (TID) | ORAL | Status: DC
  Administered 2022-05-12 – 2022-05-13 (×2): 0.5 mg via ORAL
  Filled 2022-05-12: qty 1
  Filled 2022-05-12: qty 2
  Filled 2022-05-12: qty 1

## 2022-05-12 MED ORDER — LACTATED RINGERS IV SOLN: INTRAVENOUS | Status: DC

## 2022-05-12 MED ORDER — IOHEXOL 350 MG/ML SOLN
75.0000 mL | Freq: Once | INTRAVENOUS | Status: AC | PRN
  Administered 2022-05-12: 75 mL via INTRAVENOUS

## 2022-05-12 MED ORDER — ACETAMINOPHEN 325 MG PO TABS
650.0000 mg | ORAL_TABLET | Freq: Four times a day (QID) | ORAL | Status: DC | PRN
  Administered 2022-05-13 (×2): 650 mg via ORAL
  Filled 2022-05-12 (×2): qty 2

## 2022-05-12 NOTE — Assessment & Plan Note (Addendum)
Blood glucose 225, could be steroid-induced.  A1c 8 months ago was 5.9 Patient is not on diabetic meds Will get repeat hemoglobin A1c and placed on sliding scale coverage

## 2022-05-12 NOTE — Assessment & Plan Note (Addendum)
Sepsis Sepsis criteria include tachycardia, tachypnea, leukocytosis and lactic acidosis and multifocal pneumonia Patient is very tachypneic but not requiring oxygen - Sepsis fluids - Continue cefepime and vancomycin -Antitussives, albuterol as needed, incentive spirometer - Follow respiratory viral panel, blood cultures -Follow CTA chest with contrast ordered from the ED to evaluate for PE - May need BiPAP for comfort - Continue to monitor closely

## 2022-05-12 NOTE — Consult Note (Signed)
PHARMACY -  BRIEF ANTIBIOTIC NOTE   Pharmacy has received consult(s) for cefepime and vancomycin dosing from an ED provider.  The patient's profile has been reviewed for ht/wt/allergies/indication/available labs.    One time order(s) placed for cefepime 2 grams IV x 1 and vancomycin 1500 mg IV x 1  Further antibiotics/pharmacy consults should be ordered by admitting physician if indicated.                       Thank you, Lorin Picket, PharmD 05/12/2022  7:04 PM

## 2022-05-12 NOTE — ED Notes (Signed)
Jari Pigg, MD, made aware of lactic 2.6

## 2022-05-12 NOTE — ED Provider Notes (Signed)
[facility] Provider Note    Event Date/Time   First MD Initiated Contact with Patient 05/12/22 1723     (approximate)   History   Pneumonia (Pt stats double PNA)   HPI  Summer Murphy is a 67 y.o. female   with COPD, asthma, diabetes who comes in with shortness of breath.  Patient denies any history of blood clots any history of CHF.  Patient had recent admission on 04/24/2022.  She was discharged on 04/26/2022 after being treated for multifocal pneumonia with Rocephin and doxycycline.  And remained on room air she reports having worsening shortness of breath that started yesterday and was seen by her doctor who got another CT scan on her chest yesterday that stated that the pneumonia seem more regressed than prior..      Physical Exa   Triage Vital Signs: ED Triage Vitals [05/12/22 1708]  Enc Vitals Group     BP (!) 218/168     Pulse Rate (!) 106     Resp (!) 23     Temp      Temp src      SpO2 96 %     Weight      Height      Head Circumference      Peak Flow      Pain Score 0     Pain Loc      Pain Edu?      Excl. in [place]?     Most recent vital signs: Vitals:   05/12/22 1708  BP: (!) 218/168  Pulse: (!) 106  Resp: (!) 23  SpO2: 96%     General: Awake, no distress.  CV:  Good peripheral perfusion.  Resp:  Normal effort.  Increased work of breathing with tight lung sounds. Abd:  No distention.  Other:     ED Results / Procedures / Treatments   Labs (all labs ordered are listed, but only abnormal results are displayed) Labs Reviewed  BLOOD GAS, VENOUS - Abnormal; Notable for the following components:      Result Value   pH, Ven 7.47 (*)    pCO2, Ven 37 (*)    pO2, Ven 63 (*)    Acid-Base Excess 3.2 (*)    All other components within normal limits  RESP PANEL BY RT-PCR (RSV, FLU A&B, COVID)  RVPGX2  CULTURE, BLOOD (ROUTINE X 2)  CULTURE, BLOOD (ROUTINE X 2)  CBC WITH DIFFERENTIAL/PLATELET  COMPREHENSIVE METABOLIC PANEL   LIPASE, BLOOD  BRAIN NATRIURETIC PEPTIDE  LACTIC ACID, PLASMA  LACTIC ACID, PLASMA  PROCALCITONIN  PROCALCITONIN  D-DIMER, QUANTITATIVE  TROPONIN I (HIGH SENSITIVITY)     EKG  My interpretation of EKG:  Sinus tachycardia rate of 108 without any ST elevation, does have left bundle branch block with a T wave version in lead II and aVF.  Reviewed prior EKG and has a similar left bundle branch block  RADIOLOGY I have reviewed the xray personally and interpreted and in some interstitial markings are noted  PROCEDURES:  Critical Care performed: Yes, see critical care procedure note(s)  .1-3 Lead EKG Interpretation  Performed by: Vanessa [place], MD Authorized by: Vanessa [place], MD     Interpretation: abnormal     ECG rate:  110   ECG rate assessment: tachycardic     Rhythm: sinus tachycardia     Ectopy: none     Conduction: normal   .Critical Care  Performed by: Vanessa [place],  MD Authorized by: Vanessa [place], MD   Critical care provider statement:    Critical care time (minutes):  30   Critical care was necessary to treat or prevent imminent or life-threatening deterioration of the following conditions:  Respiratory failure   Critical care was time spent personally by me on the following activities:  Development of treatment plan with patient or surrogate, discussions with consultants, evaluation of patient's response to treatment, examination of patient, ordering and review of laboratory studies, ordering and review of radiographic studies, ordering and performing treatments and interventions, pulse oximetry, re-evaluation of patient's condition and review of old charts    MEDICATIONS ORDERED IN ED: Medications  ipratropium-albuterol (DUONEB) 0.5-2.5 (3) MG/3ML nebulizer solution 3 mL (3 mLs Nebulization Given 05/12/22 1741)  ipratropium-albuterol (DUONEB) 0.5-2.5 (3) MG/3ML nebulizer solution 3 mL (3 mLs Nebulization Given 05/12/22 1741)  ipratropium-albuterol (DUONEB)  0.5-2.5 (3) MG/3ML nebulizer solution 3 mL (3 mLs Nebulization Given 05/12/22 1741)  methylPREDNISolone sodium succinate (SOLU-MEDROL) 125 mg/2 mL injection 125 mg (125 mg Intravenous Given 05/12/22 1748)     IMPRESSION / MDM / [place] / ED COURSE  I reviewed the triage vital signs and the nursing notes.   Patient's presentation is most consistent with acute presentation with potential threat to life or bodily function.   Patient comes in with increased work of breathing tight air exchange on examination could be asthma exacerbation.  She was concerned about recurrent pneumonia but appears that from the CT done yesterday that the pneumonia seems to be resolving.  Will get a procalcitonin to further evaluate.  Blood cultures, lactate were ordered.  Will get D-dimer evaluate for PE.  COVID, flu test.  Patient be placed on BiPAP -patient is not hypoxic but more for her work of breathing.  Given DuoNebs, steroids.  Troponin is negative x 2.  Lactate initially elevated but downtrending white count was elevated but patient is on steroids.  BNP is normal.  Her VBG is reassuring.  Patient looks much improved on BiPAP.  Patient's COVID and flu test are still pending D-dimer was slightly elevated so I ordered a CT PE given the overall appearance on CT without was actually improving in the meantime I have covered her with antibiotics just to make sure there is no signs of possible bacterial pneumonia still pending procalcitonin.  I will discuss with hospital team for admission.    The patient is on the cardiac monitor to evaluate for evidence of arrhythmia and/or significant heart rate changes.      FINAL CLINICAL IMPRESSION(S) / ED DIAGNOSES   Final diagnoses:  Severe asthma with exacerbation, unspecified whether persistent  Pneumonia of both lungs due to infectious organism, unspecified part of lung     Rx / DC Orders   ED Discharge Orders     None        Note:  This document  was prepared using Dragon voice recognition software and may include unintentional dictation errors.   Vanessa [place], MD 05/12/22 2018

## 2022-05-12 NOTE — ED Notes (Signed)
RN advised by previous shift nurse cultures collected by lab personnel

## 2022-05-12 NOTE — Assessment & Plan Note (Deleted)
Blood glucose 225, could be steroid-induced Patient is not on diabetic meds Will get hemoglobin A1c

## 2022-05-12 NOTE — H&P (Signed)
History and Physical    Patient: Summer Murphy [id number] DOB: 01-03-1956 DOA: 05/12/2022 DOS: the patient was seen and examined on 05/12/2022 PCP: Jonetta Osgood, NP  Patient coming from: Home  Chief Complaint:  Chief Complaint  Patient presents with   Pneumonia    Pt stats double PNA    HPI: Summer Murphy is a 67 y.o. female with medical history significant for COPD/asthma Gold stage II, hiatal hernia, hypertension, hyperlipidemia, restless leg syndrome, on room air at baseline, anxiety/depression, migraines, hospitalized from 2/2 to 2/4 with community-acquired pneumonia who returns to the [facility] with chest pain and shortness of breath and inability to lie flat.  She was seen at the urgent care on 2/19 and respiratory distress.  She had a CT chest without contrast that showed regressed bilateral multilobar bronchopneumonia since 04/24/2022 among other findings.  She was treated and given return precautions.  Due to persistent symptoms she presented to the ED today. Patient denies fever, vomiting, abdominal pain or diarrhea. ED course and data review: Upon arrival BP 218/168, pulse 106 and respirations 23 with O2 sat 96% on room air.  Afebrile.  Venous pH 7.47 with pCO2 37.  Labs otherwise notable for WBC 13,500 with lactic acid 2.6, D-dimer 0.67.  Troponin 7 and BNP 40.  Blood glucose 225.  Respiratory viral panel pending. EKG, personally viewed and interpreted shows sinus tachycardia at 108 with no acute ST-T wave changes.  Chest x-ray shows multifocal pneumonia as follows: IMPRESSION: 1. Interstitial and perihilar ground-glass opacities consistent with atypical viral pneumonia, including etiologies such as COVID-19. No significant change since CT performed yesterday.  Patient was treated with DuoNebs, methylprednisolone and started on cefepime and vancomycin and hospitalist consulted for admission.   Review of Systems: As mentioned in the history of present illness. All other  systems reviewed and are negative.  Past Medical History:  Diagnosis Date   Acid reflux    Anemia    Anxiety    Arrhythmia    treated with meds and has no current problems   Arthritis    most uncomfortable in knees   Asthma    uses several inhalers   Depression    Fever blister    Hematuria    History of kidney stones    Hypertension    Hypoglycemia    Left flank pain    Migraine    Pre-diabetes    Restless leg    Yeast vaginitis    Past Surgical History:  Procedure Laterality Date   ABDOMINAL HYSTERECTOMY     AMPUTATION TOE Left 07/31/2016   Procedure: AMPUTATION TOE/MPJ 2nd toe;  Surgeon: Sharlotte Alamo, DPM;  Location: [facility] ORS;  Service: Podiatry;  Laterality: Left;   APPENDECTOMY  1990   BACK SURGERY     low back   BREAST SURGERY     bilateral breast reduction   CARDIAC ELECTROPHYSIOLOGY STUDY AND ABLATION     CHOLECYSTECTOMY  1990   COLONOSCOPY WITH PROPOFOL N/A 01/04/2017   Procedure: COLONOSCOPY WITH PROPOFOL;  Surgeon: Manya Silvas, MD;  Location: [facility] ENDOSCOPY;  Service: Endoscopy;  Laterality: N/A;   CORNEAL TRANSPLANT     ESOPHAGOGASTRODUODENOSCOPY N/A 04/09/2021   Procedure: ESOPHAGOGASTRODUODENOSCOPY (EGD);  Surgeon: Lin Landsman, MD;  Location: [facility] ENDOSCOPY;  Service: Gastroenterology;  Laterality: N/A;   ESOPHAGOGASTRODUODENOSCOPY (EGD) WITH PROPOFOL N/A 01/04/2017   Procedure: ESOPHAGOGASTRODUODENOSCOPY (EGD) WITH PROPOFOL;  Surgeon: Manya Silvas, MD;  Location: [facility] ENDOSCOPY;  Service: Endoscopy;  Laterality: N/A;   EXCISION  BONE CYST Left 07/31/2016   Procedure: EXCISION BONE CYST/exostectomy 28124/left 2nd;  Surgeon: Sharlotte Alamo, DPM;  Location: [facility] ORS;  Service: Podiatry;  Laterality: Left;   EXTRACORPOREAL SHOCK WAVE LITHOTRIPSY Left 09/12/2015   Procedure: EXTRACORPOREAL SHOCK WAVE LITHOTRIPSY (ESWL);  Surgeon: Hollice Espy, MD;  Location: [facility] ORS;  Service: Urology;  Laterality: Left;   FRACTURE SURGERY     left foot    HARDWARE REMOVAL Left 11/14/2020   Procedure: HARDWARE REMOVAL;  Surgeon: Hessie Knows, MD;  Location: [facility] ORS;  Service: Orthopedics;  Laterality: Left;   Kennett repair     Fundoplication   JOINT REPLACEMENT Bilateral 2013,2014   total knees   LAPAROSCOPIC HYSTERECTOMY     LITHOTRIPSY     periprosthetic supracondylar fracture of left femur  02/16/2020   [facility]   TONSILLECTOMY     TOTAL KNEE REVISION Right 11/14/2019   Procedure: Revision patella and tibial polyethylene;  Surgeon: Hessie Knows, MD;  Location: [facility] ORS;  Service: Orthopedics;  Laterality: Right;   URETEROSCOPY     Social History:  reports that she has never smoked. She has been exposed to tobacco smoke. She has never used smokeless tobacco. She reports that she does not drink alcohol and does not use drugs.  Allergies  Allergen Reactions   Librium [Chlordiazepoxide] Shortness Of Breath   Metoclopramide Hives and Other (See Comments)    hallucinations    Vanilla Shortness Of Breath   Aspirin Hives   Nsaids Rash    Rash/flares asthma issues.   Azithromycin Other (See Comments)    Unsure of what the reaction was.   Buprenorphine Hcl Other (See Comments)    Unsure of what the reaction was.    Chlordiazepoxide Hcl Other (See Comments)    unsure   Morphine Other (See Comments)    Unsure of reaction   Sulfa Antibiotics     Other reaction(s): Unknown   Tolmetin     Other Reaction: Allergy   Amlodipine Besylate Itching and Rash    arms, stomach and forehead   Iron Nausea And Vomiting    Other reaction(s): Unknown    Family History  Problem Relation Age of Onset   Hypertension Mother    Stroke Mother    Prostate cancer Father    Kidney Stones Father    Diabetes Brother    Hypertension Brother    Breast cancer Maternal Aunt    Kidney disease Neg Hx     Prior to Admission medications   Medication Sig Start Date End Date Taking? Authorizing Provider  benzonatate (TESSALON) 100 MG capsule Take 1  capsule (100 mg total) by mouth 2 (two) times daily. 05/06/22   Jonetta Osgood, NP  busPIRone (BUSPAR) 10 MG tablet Take 1 tablet (10 mg total) by mouth 2 (two) times daily. 05/06/22   Jonetta Osgood, NP  clonazePAM (KLONOPIN) 1 MG tablet Take 0.5 tablets (0.5 mg total) by mouth 2 (two) times daily as needed for anxiety. 05/06/22   Jonetta Osgood, NP  diclofenac Sodium (VOLTAREN) 1 % GEL Apply 4 g topically 4 (four) times daily. 08/13/20   Lavera Guise, MD  diltiazem (CARTIA XT) 240 MG 24 hr capsule Take 1 capsule (240 mg total) by mouth daily. 12/11/21   Lavera Guise, MD  DULoxetine (CYMBALTA) 60 MG capsule Take 1 capsule (60 mg total) by mouth at bedtime. 05/06/22   Jonetta Osgood, NP  EPINEPHrine 0.3 mg/0.3 mL IJ SOAJ injection Inject 0.3 mg into the muscle as needed  for anaphylaxis. 12/18/20   Lavera Guise, MD  fluconazole (DIFLUCAN) 150 MG tablet Take 1 tablet (150 mg total) by mouth daily. 05/04/22   Jonetta Osgood, NP  fluticasone (FLONASE) 50 MCG/ACT nasal spray Place 2 sprays into both nostrils daily. Patient taking differently: Place 2 sprays into both nostrils daily as needed for rhinitis or allergies. 09/18/21   Jonetta Osgood, NP  fluticasone furoate-vilanterol (BREO ELLIPTA) 100-25 MCG/ACT AEPB Inhale 1 puff into the lungs daily. 04/29/21   Allyne Gee, MD  gabapentin (NEURONTIN) 600 MG tablet Take 1 tablet (600 mg total) by mouth 2 (two) times daily. 05/06/22   Jonetta Osgood, NP  hydrOXYzine (ATARAX) 25 MG tablet Take 1-2 tablets (25-50 mg total) by mouth every 6 (six) hours as needed for itching. 10/28/21   Jonetta Osgood, NP  Ipratropium-Albuterol (COMBIVENT RESPIMAT) 20-100 MCG/ACT AERS respimat INHALE 1 PUFF INTO THE LUNGS EVERY 6 HOURS 03/24/22   Abernathy, Yetta Flock, NP  ipratropium-albuterol (DUONEB) 0.5-2.5 (3) MG/3ML SOLN Take 3 mLs by nebulization every 4 (four) hours as needed (for shortness of breath/wheezing). 04/26/22 05/26/22  Hollice Gong, Mir M, MD  levofloxacin  (LEVAQUIN) 750 MG tablet Take 1 tablet (750 mg total) by mouth daily for 14 days. Take with food 05/11/22 05/25/22  Jonetta Osgood, NP  loratadine (CLARITIN) 10 MG tablet Take 1 tablet (10 mg total) by mouth daily. Patient taking differently: Take 10 mg by mouth daily as needed for allergies, rhinitis or itching. 06/12/21   Jonetta Osgood, NP  montelukast (SINGULAIR) 10 MG tablet Take 1 tablet (10 mg total) by mouth at bedtime. 05/06/22   Jonetta Osgood, NP  ondansetron (ZOFRAN) 4 MG tablet TAKE 1 TABLET BY MOUTH TWICE DAILY AS NEEDED 11/06/21   Lavera Guise, MD  pantoprazole (PROTONIX) 40 MG tablet Take 40 mg by mouth 2 (two) times daily.    [provider]  pilocarpine (SALAGEN) 5 MG tablet Take 5 mg by mouth 2 (two) times daily.    [provider]  predniSONE (STERAPRED UNI-PAK 21 TAB) 10 MG (21) TBPK tablet Per package instructions 05/11/22   Jonetta Osgood, NP  rOPINIRole (REQUIP) 0.5 MG tablet Take two tablets po BID for restless legs. 09/18/21   Jonetta Osgood, NP  sennosides-docusate sodium (SENOKOT-S) 8.6-50 MG tablet Take 2 tablets by mouth daily. Patient taking differently: Take 2 tablets by mouth daily as needed for constipation. 11/15/20   Duanne Guess, PA-C  SUMAtriptan (IMITREX) 100 MG tablet Take 1 tablet (100 mg total) by mouth every 2 (two) hours as needed (for migraine headaches.). May repeat in 1 hours if headache persists or recurs. 09/18/21   Jonetta Osgood, NP  traZODone (DESYREL) 150 MG tablet TAKE 1 TABLET(150 MG) BY MOUTH AT BEDTIME 05/06/22   Jonetta Osgood, NP    Physical Exam: Vitals:   05/12/22 1708 05/12/22 1725 05/12/22 1745 05/12/22 1917  BP: (!) 218/168 (!) 127/104    Pulse: (!) 106 (!) 116    Resp: (!) 23 (!) 32    Temp:    97.7 F (36.5 C)  TempSrc:    Oral  SpO2: 96% 94% 95%    Physical Exam Vitals and nursing note reviewed.  Constitutional:      General: She is not in acute distress.    Comments: Conversational  dyspnea  HENT:     Head: Normocephalic and atraumatic.  Cardiovascular:     Rate and Rhythm: Regular rhythm. Tachycardia present.     Heart sounds: Normal heart sounds.  Pulmonary:  Effort: Tachypnea present.     Breath sounds: Wheezing present.  Abdominal:     Palpations: Abdomen is soft.     Tenderness: There is no abdominal tenderness.  Neurological:     Mental Status: Mental status is at baseline.     Labs on Admission: I have personally reviewed following labs and imaging studies  CBC: Recent Labs  Lab 05/12/22 1740  WBC 13.5*  NEUTROABS 12.6*  HGB 13.6  HCT 42.2  MCV 88.1  PLT 123XX123   Basic Metabolic Panel: Recent Labs  Lab 05/12/22 1740  NA 136  K 4.3  CL 98  CO2 25  GLUCOSE 225*  BUN 23  CREATININE 0.81  CALCIUM 10.4*   GFR: Estimated Creatinine Clearance: 65.9 mL/min (by C-G formula based on SCr of 0.81 mg/dL). Liver Function Tests: Recent Labs  Lab 05/12/22 1740  AST 26  ALT 49*  ALKPHOS 107  BILITOT 0.6  PROT 7.5  ALBUMIN 3.9   Recent Labs  Lab 05/12/22 1740  LIPASE 25   No results for input(s): "AMMONIA" in the last 168 hours. Coagulation Profile: No results for input(s): "INR", "PROTIME" in the last 168 hours. Cardiac Enzymes: No results for input(s): "CKTOTAL", "CKMB", "CKMBINDEX", "TROPONINI" in the last 168 hours. BNP (last 3 results) No results for input(s): "PROBNP" in the last 8760 hours. HbA1C: No results for input(s): "HGBA1C" in the last 72 hours. CBG: No results for input(s): "GLUCAP" in the last 168 hours. Lipid Profile: No results for input(s): "CHOL", "HDL", "LDLCALC", "TRIG", "CHOLHDL", "LDLDIRECT" in the last 72 hours. Thyroid Function Tests: No results for input(s): "TSH", "T4TOTAL", "FREET4", "T3FREE", "THYROIDAB" in the last 72 hours. Anemia Panel: No results for input(s): "VITAMINB12", "FOLATE", "FERRITIN", "TIBC", "IRON", "RETICCTPCT" in the last 72 hours. Urine analysis:    Component Value Date/Time    COLORURINE STRAW (A) 04/24/2022 1158   APPEARANCEUR CLEAR (A) 04/24/2022 1158   APPEARANCEUR Turbid (A) 09/18/2021 1546   LABSPEC 1.009 04/24/2022 1158   LABSPEC 1.011 06/28/2014 1724   PHURINE 5.0 04/24/2022 1158   GLUCOSEU 50 (A) 04/24/2022 1158   GLUCOSEU Negative 06/28/2014 1724   HGBUR MODERATE (A) 04/24/2022 1158   BILIRUBINUR NEGATIVE 04/24/2022 1158   BILIRUBINUR Negative 09/18/2021 1546   BILIRUBINUR Negative 06/28/2014 1724   KETONESUR NEGATIVE 04/24/2022 1158   PROTEINUR NEGATIVE 04/24/2022 1158   NITRITE NEGATIVE 04/24/2022 1158   LEUKOCYTESUR NEGATIVE 04/24/2022 1158   LEUKOCYTESUR Negative 06/28/2014 1724    Radiological Exams on Admission: DG Chest Portable 1 View  Result Date: 05/12/2022 CLINICAL DATA:  Short of breath, respiratory distress EXAM: PORTABLE CHEST 1 VIEW COMPARISON:  04/24/2022, 05/11/2022 FINDINGS: Single frontal view of the chest demonstrates a stable cardiac silhouette. There is increased interstitial prominence throughout the lungs, with patchy bilateral perihilar ground-glass airspace disease. No effusion or pneumothorax. Stable hiatal hernia. No acute bony abnormalities. IMPRESSION: 1. Interstitial and perihilar ground-glass opacities consistent with atypical viral pneumonia, including etiologies such as COVID-19. No significant change since CT performed yesterday. Electronically Signed   By: Randa Ngo M.D.   On: 05/12/2022 18:21   CT Chest Wo Contrast  Result Date: 05/11/2022 CLINICAL DATA:  67 year old female with abnormal chest CTA on 04/24/2022 suspicious for multifocal pneumonia. Finished antibiotics. Respiratory illness. Worsening shortness of breath and chest tightness over the past 3 days. History of bilateral breast reduction. EXAM: CT CHEST WITHOUT CONTRAST TECHNIQUE: Multidetector CT imaging of the chest was performed following the standard protocol without IV contrast. RADIATION DOSE REDUCTION: This exam  was performed according to the  departmental dose-optimization program which includes automated exposure control, adjustment of the mA and/or kV according to patient size and/or use of iterative reconstruction technique. COMPARISON:  Chest CTA 04/24/2022. FINDINGS: Cardiovascular: No cardiomegaly or pericardial effusion. No significant thoracic aortic calcified atherosclerosis. Vascular patency is not evaluated in the absence of IV contrast. Mediastinum/Nodes: Postoperative changes surrounding moderate sized gastric hiatal hernia this is stable from earlier this month. No superimposed mediastinal mass or lymphadenopathy. Lungs/Pleura: Mild respiratory motion today slightly larger lung volumes. Major airways are patent. There is up to mild bilateral perihilar bronchial wall thickening. Right upper lobe, middle lobe and lower lobe regressed peribronchial opacity, although not fully resolved. Residual peribronchial sub solid ground-glass opacity now in the previously affected areas including a 2 cm area of the peripheral right upper lobe on series 3 image 26. See also image 33 in the upper lobe, image 48 near the hilum, image 71 in the middle lobe. No right pleural effusion. Contralateral much less pronounced lower lobe peribronchial opacity has also regressed with occasional small areas of ground-glass residual (series 3, image 68 in the left lower lobe). However, a vague 3 cm area of peripheral left upper lobe ground-glass opacity has increased since the prior on image 37. No left pleural effusion Upper Abdomen: Numerous additional surgical clips along the esophageal hiatus and stomach, gastrosplenic ligament. Partially visible cholecystectomy clips also. Negative visible noncontrast liver, spleen, adrenal glands, large and small bowel loops. Fatty atrophy pancreas peer right upper pole nephrolithiasis measuring 4 mm. No free air or free fluid in the visible upper abdomen. Musculoskeletal: Advanced thoracic disc and endplate degeneration with  partially visible T12-L1 interbody ankylosis. No acute or suspicious osseous lesion identified. Stable postoperative changes to the breast soft tissues with areas of coarse, likely postoperative dystrophic calcification (on the right series 2, image 65). IMPRESSION: 1. Regressed bilateral multilobar Bronchopneumonia since 04/24/2022. Residual scattered ground-glass opacity, and a vague new 3 cm area of peripheral left upper lobe ground-glass opacity. No pleural effusion. Per Fleischner Society Guidelines, recommend a non-contrast Chest CT at 6 months to confirm persistence, then additional non-contrast Chest CTs every 2 years until 5 years. If nodule grows or develops solid component(s), consider resection. These guidelines do not apply to immunocompromised patients and patients with cancer. Follow up in patients with significant comorbidities as clinically warranted. For lung cancer screening, adhere to Lung-RADS guidelines. Reference: Radiology. 2017; 284(1):228-43. 2. Chronic postoperative changes associated with moderate gastric hiatal hernia and intra-abdominal stomach. Cholecystectomy. Right nephrolithiasis. Electronically Signed   By: Genevie Ann M.D.   On: 05/11/2022 10:49     Data Reviewed: Relevant notes from primary care and specialist visits, past discharge summaries as available in EHR, including Care Everywhere. Prior diagnostic testing as pertinent to current admission diagnoses Updated medications and problem lists for reconciliation ED course, including vitals, labs, imaging, treatment and response to treatment Triage notes, nursing and pharmacy notes and ED provider's notes Notable results as noted in HPI   Assessment and Plan: * Multifocal pneumonia Sepsis Sepsis criteria include tachycardia, tachypnea, leukocytosis and lactic acidosis and multifocal pneumonia Patient is very tachypneic but not requiring oxygen - Sepsis fluids - Continue cefepime and vancomycin -Antitussives,  albuterol as needed, incentive spirometer - Follow respiratory viral panel, blood cultures -Follow CTA chest with contrast ordered from the ED to evaluate for PE - May need BiPAP for comfort - Continue to monitor closely  COPD with acute exacerbation (HCC) Scheduled and as needed nebulized  bronchodilators IV steroids  Hyperglycemia Blood glucose 225, could be steroid-induced.  A1c 8 months ago was 5.9 Patient is not on diabetic meds Will get repeat hemoglobin A1c and placed on sliding scale coverage  Hypertensive urgency SBP in the 200s on arrival, likely secondary to increased work of breathing, improved 127/104 by admission Continue home diltiazem to 40 mg daily    DVT prophylaxis: Lovenox  Consults: none  Advance Care Planning:   Code Status: Prior   Family Communication: none  Disposition Plan: Back to previous home environment  Severity of Illness: The appropriate patient status for this patient is INPATIENT. Inpatient status is judged to be reasonable and necessary in order to provide the required intensity of service to ensure the patient's safety. The patient's presenting symptoms, physical exam findings, and initial radiographic and laboratory data in the context of their chronic comorbidities is felt to place them at high risk for further clinical deterioration. Furthermore, it is not anticipated that the patient will be medically stable for discharge from the hospital within 2 midnights of admission.   * I certify that at the point of admission it is my clinical judgment that the patient will require inpatient hospital care spanning beyond 2 midnights from the point of admission due to high intensity of service, high risk for further deterioration and high frequency of surveillance required.*  Author: Athena Masse, MD 05/12/2022 7:56 PM  For on call review [email or web address].

## 2022-05-12 NOTE — Assessment & Plan Note (Signed)
Scheduled and as needed nebulized bronchodilators IV steroids

## 2022-05-12 NOTE — Assessment & Plan Note (Signed)
SBP in the 200s on arrival, likely secondary to increased work of breathing, improved 127/104 by admission Continue home diltiazem to 40 mg daily

## 2022-05-12 NOTE — Consult Note (Signed)
Pharmacy Antibiotic Note  Summer Murphy is a 67 y.o. female admitted on 05/12/2022 with pneumonia.  Patient with medical history significant for COPD/asthma with recent hospitalization from 2/2-04/26/22 for CAP and discharge on PO antibiotics. Pharmacy has been consulted for vancomycin and cefepime dosing.  Plan: Initiate cefepime 2 gram Q8H Vancomycin 1500 mg x 1 given in ED.  Initiate Vancomycin 1250 mg Q24H. Goal AUC 400-550 Estimated AUC 456/Cmin: 10.4 Scr 0.81, IBW, Vd 0.72      Temp (24hrs), Avg:97.7 F (36.5 C), Min:97.7 F (36.5 C), Max:97.7 F (36.5 C)  Recent Labs  Lab 05/12/22 1740 05/12/22 1919  WBC 13.5*  --   CREATININE 0.81  --   LATICACIDVEN 2.6* 1.7    Estimated Creatinine Clearance: 65.9 mL/min (by C-G formula based on SCr of 0.81 mg/dL).    Allergies  Allergen Reactions   Librium [Chlordiazepoxide] Shortness Of Breath   Metoclopramide Hives and Other (See Comments)    hallucinations    Vanilla Shortness Of Breath   Aspirin Hives   Nsaids Rash    Rash/flares asthma issues.   Azithromycin Other (See Comments)    Unsure of what the reaction was.   Buprenorphine Hcl Other (See Comments)    Unsure of what the reaction was.    Chlordiazepoxide Hcl Other (See Comments)    unsure   Morphine Other (See Comments)    Unsure of reaction   Sulfa Antibiotics     Other reaction(s): Unknown   Tolmetin     Other Reaction: Allergy   Amlodipine Besylate Itching and Rash    arms, stomach and forehead   Iron Nausea And Vomiting    Other reaction(s): Unknown    Antimicrobials this admission: 2/20 cefepime >>  2/20 vancomycin >>   Dose adjustments this admission:   Microbiology results: 2/20 BCx: sent 2/20: resp panel: sent 2/20 MRSA PCR: ordered  Thank you for allowing pharmacy to be a part of this patient's care.  Dorothe Pea, PharmD, BCPS Clinical Pharmacist   05/12/2022 8:29 PM

## 2022-05-12 NOTE — Consult Note (Signed)
CODE SEPSIS - PHARMACY COMMUNICATION  **Broad Spectrum Antibiotics should be administered within 1 hour of Sepsis diagnosis**  Time Code Sepsis Called/Page Received: 1900  Antibiotics Ordered: cefepime and vancomycin  Time of 1st antibiotic administration: 1941  Additional action taken by pharmacy: N/A  Lorin Picket ,PharmD Clinical Pharmacist  05/12/2022  7:05 PM

## 2022-05-12 NOTE — Sepsis Progress Note (Signed)
Elink monitoring for the code sepsis protocol.  

## 2022-05-12 NOTE — ED Notes (Signed)
Patient transported to CT 

## 2022-05-12 NOTE — ED Triage Notes (Addendum)
Pt presents to ED from PCP with c/o of SOB due to double PNA, pt does appear to be in resp distress, pt states it is very hard for her to take a deep breath, O2 sat is 97% on RA but pt does appear in distress. Pt state she has been breathing like that since yesterday.   Pt states CT scan yesterday.

## 2022-05-13 ENCOUNTER — Encounter: Payer: Self-pay | Admitting: Internal Medicine

## 2022-05-13 ENCOUNTER — Ambulatory Visit: Payer: Self-pay | Admitting: Internal Medicine

## 2022-05-13 DIAGNOSIS — R0600 Dyspnea, unspecified: Secondary | ICD-10-CM | POA: Diagnosis present

## 2022-05-13 DIAGNOSIS — R0609 Other forms of dyspnea: Secondary | ICD-10-CM | POA: Diagnosis present

## 2022-05-13 DIAGNOSIS — J189 Pneumonia, unspecified organism: Secondary | ICD-10-CM | POA: Diagnosis not present

## 2022-05-13 LAB — BASIC METABOLIC PANEL
Anion gap: 6 (ref 5–15)
BUN: 22 mg/dL (ref 8–23)
CO2: 27 mmol/L (ref 22–32)
Calcium: 9 mg/dL (ref 8.9–10.3)
Chloride: 102 mmol/L (ref 98–111)
Creatinine, Ser: 0.69 mg/dL (ref 0.44–1.00)
GFR, Estimated: >60 mL/min (ref >60)
Glucose, Bld: 266 mg/dL — ABNORMAL HIGH (ref 70–99)
Potassium: 4.3 mmol/L (ref 3.5–5.1)
Sodium: 135 mmol/L (ref 135–145)

## 2022-05-13 LAB — HIV ANTIBODY (ROUTINE TESTING W REFLEX): HIV Screen 4th Generation wRfx: NONREACTIVE

## 2022-05-13 LAB — CBC
HCT: 35.8 % — ABNORMAL LOW (ref 36.0–46.0)
Hemoglobin: 11.6 g/dL — ABNORMAL LOW (ref 12.0–15.0)
MCH: 28.9 pg (ref 26.0–34.0)
MCHC: 32.4 g/dL (ref 30.0–36.0)
MCV: 89.3 fL (ref 80.0–100.0)
Platelets: 249 10*3/uL (ref 150–400)
RBC: 4.01 MIL/uL (ref 3.87–5.11)
RDW: 13.4 % (ref 11.5–15.5)
WBC: 13.2 10*3/uL — ABNORMAL HIGH (ref 4.0–10.5)
nRBC: 0 % (ref 0.0–0.2)

## 2022-05-13 LAB — GLUCOSE, CAPILLARY
Glucose-Capillary: 217 mg/dL — ABNORMAL HIGH (ref 70–99)
Glucose-Capillary: 262 mg/dL — ABNORMAL HIGH (ref 70–99)

## 2022-05-13 LAB — HEMOGLOBIN A1C
Hgb A1c MFr Bld: 6.8 % — ABNORMAL HIGH (ref 4.8–5.6)
Mean Plasma Glucose: 148.46 mg/dL

## 2022-05-13 LAB — PROTIME-INR
INR: 1.1 (ref 0.8–1.2)
Prothrombin Time: 14.1 seconds (ref 11.4–15.2)

## 2022-05-13 LAB — PROCALCITONIN: Procalcitonin: <0.1 ng/mL

## 2022-05-13 LAB — CORTISOL-AM, BLOOD: Cortisol - AM: 2.4 ug/dL — ABNORMAL LOW (ref 6.7–22.6)

## 2022-05-13 LAB — MRSA NEXT GEN BY PCR, NASAL: MRSA by PCR Next Gen: NOT DETECTED

## 2022-05-13 MED ORDER — NYSTATIN 100000 UNIT/ML MT SUSP
5.0000 mL | Freq: Four times a day (QID) | OROMUCOSAL | Status: DC
  Administered 2022-05-13: 500000 [IU] via ORAL
  Filled 2022-05-13: qty 5

## 2022-05-13 MED ORDER — LORATADINE 10 MG PO TABS: 10.0000 mg | ORAL_TABLET | Freq: Every day | ORAL | Status: DC | PRN

## 2022-05-13 MED ORDER — IPRATROPIUM-ALBUTEROL 0.5-2.5 (3) MG/3ML IN SOLN: 3.0000 mL | RESPIRATORY_TRACT | 0 refills | Status: DC | PRN

## 2022-05-13 MED ORDER — BENZONATATE 100 MG PO CAPS: 100.0000 mg | ORAL_CAPSULE | Freq: Two times a day (BID) | ORAL | 0 refills | Status: DC | PRN

## 2022-05-13 MED ORDER — PREDNISONE 10 MG PO TABS: ORAL_TABLET | ORAL | 0 refills | Status: DC

## 2022-05-13 MED ORDER — FLUTICASONE PROPIONATE 50 MCG/ACT NA SUSP: 2.0000 | Freq: Every day | NASAL | Status: DC | PRN

## 2022-05-13 MED ORDER — SENNA-DOCUSATE SODIUM 8.6-50 MG PO TABS: 2.0000 | ORAL_TABLET | Freq: Every day | ORAL | Status: DC | PRN

## 2022-05-13 NOTE — Discharge Summary (Signed)
Physician Discharge Summary   Summer Murphy  female DOB: [birth year]  [id number]  PCP: Jonetta Osgood, NP  Admit date: 05/12/2022 Discharge date: 05/13/2022  Admitted From: home Disposition:  home CODE STATUS: Full code  Discharge Instructions     Discharge instructions   Complete by: As directed    You are still recovering from your first pneumonia, and I don't think you have a 2nd new pneumonia and do not need more antibiotics.  Your breathing improved after coming to the hospital likely from the IV steroid you received.  I am discharging you on a long steroid taper, please take as directed and follow up with Dr. Humphrey Rolls 1 week after discharge.   Dr. Enzo [facility] Course:  For full details, please see H&P, progress notes, consult notes and ancillary notes.  Briefly,  Summer Murphy is a 67 y.o. female with medical history significant for COPD/asthma Gold stage II, hiatal hernia, hypertension, anxiety/depression, hospitalized from 2/2 to 2/4 with community-acquired pneumonia who returned to the ED with chest pain and shortness of breath and inability to lie flat.   On presentation, O2 sat 96% on room air.  Pt was started on Vanc and cefepime for PNA.  * Multifocal pneumonia, ruled out Sepsis, ruled out pt was recently treated for PNA and discharged on 2/4.  On current presentation, pt was not hypoxic, CT chest actually showed improvement, procal neg.  WBC was mildly elevated at around 13, which was lower than on last discharge, also pt has been taking steroid.  Pt's respiratory distress improved the next day which I think is due to IV solumedrol.  I do not think pt has a new episode of PNA that needs abx treatment.  Pt was discharged on a long prednisone taper, and should follow up with pulm Dr. Humphrey Rolls 1 week after discharge. --discussed with Dr. Humphrey Rolls who agreed with the above plan.   Moderate persistent asthma  --follows with pulm Dr. Humphrey Rolls.  Pt may be  steroid-dependent.  Pt was discharged on a long prednisone taper, and to follow up with pulm Dr. Humphrey Rolls 1 week after discharge. --cont home bronchodilator regimen as below.   Prediabetes  Hyperglycemia Blood glucose 225, could be steroid-induced.  A1c 8 months ago was 5.9, and has been in the prediabetic range for the past 6 years. Patient is not on diabetic meds  Hypertensive urgency SBP in the 200s on arrival, likely secondary to increased work of breathing, improved 127/104 by admission Continue home diltiazem to 40 mg daily  Anxiety and depression --cont home Buspar, trazodone, Klonopin PRN  Large hiatal hernia with GERD --cont home protonix BID   Discharge Diagnoses:  Principal Problem:   Multifocal pneumonia Active Problems:   Sepsis ([place])   COPD with acute exacerbation ([place])   Hypertensive urgency   Hyperglycemia   Dyspnea   30 Day Unplanned Readmission Risk Score    Flowsheet Row ED to Hosp-Admission (Current) from 05/12/2022 in [place] [facility]  30 Day Unplanned Readmission Risk Score (%) 16.8 Filed at 05/13/2022 1200       This score is the patient's risk of an unplanned readmission within 30 days of being discharged (0 -100%). The score is based on dignosis, age, lab data, medications, orders, and past utilization.   Low:  0-14.9   Medium: 15-21.9   High: 22-29.9   Extreme: 30 and above         Discharge  Instructions:  Allergies as of 05/13/2022       Reactions   Librium [chlordiazepoxide] Shortness Of Breath   Metoclopramide Hives, Other (See Comments)   hallucinations   Vanilla Shortness Of Breath   Pt reports allergy to vanilla extract only   Aspirin Hives   Nsaids Rash   Rash/flares asthma issues.   Azithromycin Other (See Comments)   Unsure of what the reaction was.   Buprenorphine Hcl Other (See Comments)   Unsure of what the reaction was.   Chlordiazepoxide Hcl Other (See Comments)   unsure   Morphine Other (See Comments)    Unsure of reaction   Sulfa Antibiotics    Other reaction(s): Unknown   Tolmetin    Other Reaction: Allergy   Amlodipine Besylate Itching, Rash   arms, stomach and forehead   Iron Nausea And Vomiting   Other reaction(s): Unknown        Medication List     STOP taking these medications    fluconazole 150 MG tablet Commonly known as: DIFLUCAN   levofloxacin 750 MG tablet Commonly known as: Levaquin   predniSONE 10 MG (21) Tbpk tablet Commonly known as: STERAPRED UNI-PAK 21 TAB Replaced by: predniSONE 10 MG tablet       TAKE these medications    acetaminophen-codeine 300-30 MG tablet Commonly known as: TYLENOL #3 Take 1 tablet by mouth every 6 (six) hours as needed for moderate pain.   benzonatate 100 MG capsule Commonly known as: TESSALON Take 1 capsule (100 mg total) by mouth 2 (two) times daily as needed for cough. Home med. What changed:  when to take this reasons to take this additional instructions   busPIRone 10 MG tablet Commonly known as: BUSPAR Take 1 tablet (10 mg total) by mouth 2 (two) times daily.   clonazePAM 1 MG tablet Commonly known as: KLONOPIN Take 0.5 tablets (0.5 mg total) by mouth 2 (two) times daily as needed for anxiety.   Combivent Respimat 20-100 MCG/ACT Aers respimat Generic drug: Ipratropium-Albuterol INHALE 1 PUFF INTO THE LUNGS EVERY 6 HOURS   ipratropium-albuterol 0.5-2.5 (3) MG/3ML Soln Commonly known as: DUONEB Take 3 mLs by nebulization every 4 (four) hours as needed (for shortness of breath/wheezing).   diclofenac Sodium 1 % Gel Commonly known as: VOLTAREN Apply 4 g topically 4 (four) times daily.   diltiazem 240 MG 24 hr capsule Commonly known as: Cartia XT Take 1 capsule (240 mg total) by mouth daily.   DULoxetine 60 MG capsule Commonly known as: CYMBALTA Take 1 capsule (60 mg total) by mouth at bedtime.   EPINEPHrine 0.3 mg/0.3 mL Soaj injection Commonly known as: EPI-PEN Inject 0.3 mg into the muscle as  needed for anaphylaxis.   fluticasone 50 MCG/ACT nasal spray Commonly known as: FLONASE Place 2 sprays into both nostrils daily as needed for rhinitis or allergies. Home med. What changed:  when to take this reasons to take this additional instructions   fluticasone furoate-vilanterol 100-25 MCG/ACT Aepb Commonly known as: BREO ELLIPTA Inhale 1 puff into the lungs daily.   gabapentin 600 MG tablet Commonly known as: NEURONTIN Take 1 tablet (600 mg total) by mouth 2 (two) times daily.   hydrOXYzine 25 MG tablet Commonly known as: ATARAX Take 1-2 tablets (25-50 mg total) by mouth every 6 (six) hours as needed for itching.   loratadine 10 MG tablet Commonly known as: CLARITIN Take 1 tablet (10 mg total) by mouth daily as needed for allergies, rhinitis or itching. Home med. What changed:  when to take this reasons to take this additional instructions   montelukast 10 MG tablet Commonly known as: SINGULAIR Take 1 tablet (10 mg total) by mouth at bedtime.   ondansetron 4 MG tablet Commonly known as: ZOFRAN TAKE 1 TABLET BY MOUTH TWICE DAILY AS NEEDED   pantoprazole 40 MG tablet Commonly known as: PROTONIX Take 40 mg by mouth 2 (two) times daily.   pilocarpine 5 MG tablet Commonly known as: SALAGEN Take 5 mg by mouth 2 (two) times daily.   predniSONE 10 MG tablet Commonly known as: DELTASONE Take 40 mg (4 tablets) from 2/22 to 2/25, then 3 tablets from 2/26 to 2/29, then 2 tablets from 3/1 to 3/4, then 1 tablet from 3/5 until you see Dr. Humphrey Rolls. Start taking on: May 14, 2022 Replaces: predniSONE 10 MG (21) Tbpk tablet   rOPINIRole 0.5 MG tablet Commonly known as: REQUIP Take two tablets po BID for restless legs.   sennosides-docusate sodium 8.6-50 MG tablet Commonly known as: SENOKOT-S Take 2 tablets by mouth daily as needed for constipation. Home med What changed:  when to take this reasons to take this additional instructions   SUMAtriptan 100 MG  tablet Commonly known as: IMITREX Take 1 tablet (100 mg total) by mouth every 2 (two) hours as needed (for migraine headaches.). May repeat in 1 hours if headache persists or recurs.   traZODone 150 MG tablet Commonly known as: DESYREL TAKE 1 TABLET(150 MG) BY MOUTH AT BEDTIME         Follow-up Information     Jen Mow, MD Follow up in 1 week(s).   Specialty: Pulmonary Disease Why: can see PA. Contact information: [place] [address] [phone]                 Allergies  Allergen Reactions   Librium [Chlordiazepoxide] Shortness Of Breath   Metoclopramide Hives and Other (See Comments)    hallucinations    Vanilla Shortness Of Breath    Pt reports allergy to vanilla extract only   Aspirin Hives   Nsaids Rash    Rash/flares asthma issues.   Azithromycin Other (See Comments)    Unsure of what the reaction was.   Buprenorphine Hcl Other (See Comments)    Unsure of what the reaction was.    Chlordiazepoxide Hcl Other (See Comments)    unsure   Morphine Other (See Comments)    Unsure of reaction   Sulfa Antibiotics     Other reaction(s): Unknown   Tolmetin     Other Reaction: Allergy   Amlodipine Besylate Itching and Rash    arms, stomach and forehead   Iron Nausea And Vomiting    Other reaction(s): Unknown     The results of significant diagnostics from this hospitalization (including imaging, microbiology, ancillary and laboratory) are listed below for reference.   Consultations:   Procedures/Studies: CT Angio Chest PE W and/or Wo Contrast  Result Date: 05/12/2022 CLINICAL DATA:  Short of breath, respiratory distress EXAM: CT ANGIOGRAPHY CHEST WITH CONTRAST TECHNIQUE: Multidetector CT imaging of the chest was performed using the standard protocol during bolus administration of intravenous contrast. Multiplanar CT image reconstructions and MIPs were obtained to evaluate the vascular anatomy. RADIATION DOSE REDUCTION:  This exam was performed according to the departmental dose-optimization program which includes automated exposure control, adjustment of the mA and/or kV according to patient size and/or use of iterative reconstruction technique. CONTRAST:  21m OMNIPAQUE IOHEXOL 350 MG/ML SOLN COMPARISON:  05/12/2022, 05/11/2022 FINDINGS: Cardiovascular: This is a technically adequate evaluation of the pulmonary vasculature. No filling defects or pulmonary emboli. The heart is unremarkable without pericardial effusion. No evidence of thoracic aortic aneurysm or dissection. Mediastinum/Nodes: No enlarged mediastinal, hilar, or axillary lymph nodes. Thyroid gland, trachea, and esophagus demonstrate no significant findings. Small hiatal hernia. Lungs/Pleura: Evaluation limited by respiratory motion. Faint ground-glass opacities are again seen within the upper lobes, with slight improvement since the previous exam. No new airspace disease, effusion, or pneumothorax. Central airways are patent. Upper Abdomen: No acute abnormality. Musculoskeletal: No acute or destructive bony lesions. Reconstructed images demonstrate no additional findings. Review of the MIP images confirms the above findings. IMPRESSION: 1. No evidence of pulmonary embolus. 2. Faint upper lobe ground-glass airspace disease consistent with residual infection. No significant change since the exam performed yesterday, but moderate improvement since the 04/24/2022 exam. 3. Hiatal hernia. Electronically Signed   By: Randa Ngo M.D.   On: 05/12/2022 21:31   DG Chest Portable 1 View  Result Date: 05/12/2022 CLINICAL DATA:  Short of breath, respiratory distress EXAM: PORTABLE CHEST 1 VIEW COMPARISON:  04/24/2022, 05/11/2022 FINDINGS: Single frontal view of the chest demonstrates a stable cardiac silhouette. There is increased interstitial prominence throughout the lungs, with patchy bilateral perihilar ground-glass airspace disease. No effusion or pneumothorax. Stable  hiatal hernia. No acute bony abnormalities. IMPRESSION: 1. Interstitial and perihilar ground-glass opacities consistent with atypical viral pneumonia, including etiologies such as COVID-19. No significant change since CT performed yesterday. Electronically Signed   By: Randa Ngo M.D.   On: 05/12/2022 18:21   CT Chest Wo Contrast  Result Date: 05/11/2022 CLINICAL DATA:  67 year old female with abnormal chest CTA on 04/24/2022 suspicious for multifocal pneumonia. Finished antibiotics. Respiratory illness. Worsening shortness of breath and chest tightness over the past 3 days. History of bilateral breast reduction. EXAM: CT CHEST WITHOUT CONTRAST TECHNIQUE: Multidetector CT imaging of the chest was performed following the standard protocol without IV contrast. RADIATION DOSE REDUCTION: This exam was performed according to the departmental dose-optimization program which includes automated exposure control, adjustment of the mA and/or kV according to patient size and/or use of iterative reconstruction technique. COMPARISON:  Chest CTA 04/24/2022. FINDINGS: Cardiovascular: No cardiomegaly or pericardial effusion. No significant thoracic aortic calcified atherosclerosis. Vascular patency is not evaluated in the absence of IV contrast. Mediastinum/Nodes: Postoperative changes surrounding moderate sized gastric hiatal hernia this is stable from earlier this month. No superimposed mediastinal mass or lymphadenopathy. Lungs/Pleura: Mild respiratory motion today slightly larger lung volumes. Major airways are patent. There is up to mild bilateral perihilar bronchial wall thickening. Right upper lobe, middle lobe and lower lobe regressed peribronchial opacity, although not fully resolved. Residual peribronchial sub solid ground-glass opacity now in the previously affected areas including a 2 cm area of the peripheral right upper lobe on series 3 image 26. See also image 33 in the upper lobe, image 48 near the hilum,  image 71 in the middle lobe. No right pleural effusion. Contralateral much less pronounced lower lobe peribronchial opacity has also regressed with occasional small areas of ground-glass residual (series 3, image 68 in the left lower lobe). However, a vague 3 cm area of peripheral left upper lobe ground-glass opacity has increased since the prior on image 37. No left pleural effusion Upper Abdomen: Numerous additional surgical clips along the esophageal hiatus and stomach, gastrosplenic ligament. Partially visible cholecystectomy clips also. Negative visible noncontrast liver, spleen, adrenal glands, large and small bowel loops. Fatty atrophy pancreas  peer right upper pole nephrolithiasis measuring 4 mm. No free air or free fluid in the visible upper abdomen. Musculoskeletal: Advanced thoracic disc and endplate degeneration with partially visible T12-L1 interbody ankylosis. No acute or suspicious osseous lesion identified. Stable postoperative changes to the breast soft tissues with areas of coarse, likely postoperative dystrophic calcification (on the right series 2, image 65). IMPRESSION: 1. Regressed bilateral multilobar Bronchopneumonia since 04/24/2022. Residual scattered ground-glass opacity, and a vague new 3 cm area of peripheral left upper lobe ground-glass opacity. No pleural effusion. Per Fleischner Society Guidelines, recommend a non-contrast Chest CT at 6 months to confirm persistence, then additional non-contrast Chest CTs every 2 years until 5 years. If nodule grows or develops solid component(s), consider resection. These guidelines do not apply to immunocompromised patients and patients with cancer. Follow up in patients with significant comorbidities as clinically warranted. For lung cancer screening, adhere to Lung-RADS guidelines. Reference: Radiology. 2017; 284(1):228-43. 2. Chronic postoperative changes associated with moderate gastric hiatal hernia and intra-abdominal stomach. Cholecystectomy.  Right nephrolithiasis. Electronically Signed   By: Genevie Ann M.D.   On: 05/11/2022 10:49   CT Angio Chest PE W/Cm &/Or Wo Cm  Result Date: 04/24/2022 CLINICAL DATA:  Pulmonary embolism suspected, high probability. Shortness of breath for a few weeks. EXAM: CT ANGIOGRAPHY CHEST WITH CONTRAST TECHNIQUE: Multidetector CT imaging of the chest was performed using the standard protocol during bolus administration of intravenous contrast. Multiplanar CT image reconstructions and MIPs were obtained to evaluate the vascular anatomy. RADIATION DOSE REDUCTION: This exam was performed according to the departmental dose-optimization program which includes automated exposure control, adjustment of the mA and/or kV according to patient size and/or use of iterative reconstruction technique. CONTRAST:  81m OMNIPAQUE IOHEXOL 350 MG/ML SOLN COMPARISON:  Chest CT dated 11/05/2011. FINDINGS: Cardiovascular: There is no pulmonary embolism identified within the main, lobar or segmental pulmonary arteries bilaterally. No thoracic aortic aneurysm or evidence of aortic dissection. No pericardial effusion. Mediastinum/Nodes: No mass or enlarged lymph nodes are seen within the mediastinum. Large hiatal hernia. Surgical clips are seen at the gastroesophageal junction, presumably related to a previous hernia repair. Upper portion of the esophagus is unremarkable. Trachea and central bronchi are unremarkable. Lungs/Pleura: Clustered nodular and ground-glass consolidations are seen throughout the RIGHT upper lobe, RIGHT perihilar lung, RIGHT middle lobe and RIGHT lower lobe, with most prominent consolidation in the RIGHT infrahilar lung. LEFT lung is clear. No pleural effusion or pneumothorax. Upper Abdomen: No acute findings are seen on the limited images of the upper abdomen. Musculoskeletal: Degenerative spondylosis of the kyphotic thoracolumbar spine, at least moderate in degree. No acute-appearing osseous abnormality. Review of the MIP  images confirms the above findings. IMPRESSION: 1. Diffuse nodular and ground-glass consolidations throughout the RIGHT lung, with most prominent consolidation in the RIGHT infrahilar lung. Findings are most consistent with multifocal pneumonia or aspiration, favor multifocal pneumonia. Differential includes atypical pneumonias such as viral or fungal, interstitial pneumonias, hypersensitivity pneumonitis, and respiratory bronchiolitis. 2. No pulmonary embolism seen. 3. Large hiatal hernia. Electronically Signed   By: SFranki CabotM.D.   On: 04/24/2022 14:12   DG Chest 2 View  Result Date: 04/24/2022 CLINICAL DATA:  Shortness of breath.  Suspected sepsis. EXAM: CHEST - 2 VIEW COMPARISON:  Chest x-ray dated 04/09/2022 and chest x-ray dated 03/08/2022. FINDINGS: Heart size and mediastinal contours are stable. Lungs are clear. No pleural effusion or pneumothorax is seen. Hiatal hernia, moderate to large in size. Osseous structures about the chest are unremarkable. IMPRESSION:  1. No active cardiopulmonary disease. No evidence of pneumonia or pulmonary edema. 2. Moderate to large hiatal hernia. Electronically Signed   By: Franki Cabot M.D.   On: 04/24/2022 12:35      Labs: BNP (last 3 results) Recent Labs    05/12/22 1740  BNP 99991111   Basic Metabolic Panel: Recent Labs  Lab 05/12/22 1740 05/13/22 0632  NA 136 135  K 4.3 4.3  CL 98 102  CO2 25 27  GLUCOSE 225* 266*  BUN 23 22  CREATININE 0.81 0.69  CALCIUM 10.4* 9.0   Liver Function Tests: Recent Labs  Lab 05/12/22 1740  AST 26  ALT 49*  ALKPHOS 107  BILITOT 0.6  PROT 7.5  ALBUMIN 3.9   Recent Labs  Lab 05/12/22 1740  LIPASE 25   No results for input(s): "AMMONIA" in the last 168 hours. CBC: Recent Labs  Lab 05/12/22 1740 05/13/22 0632  WBC 13.5* 13.2*  NEUTROABS 12.6*  --   HGB 13.6 11.6*  HCT 42.2 35.8*  MCV 88.1 89.3  PLT 316 249   Cardiac Enzymes: No results for input(s): "CKTOTAL", "CKMB", "CKMBINDEX",  "TROPONINI" in the last 168 hours. BNP: Invalid input(s): "POCBNP" CBG: Recent Labs  Lab 05/12/22 2145 05/13/22 0806 05/13/22 1135  GLUCAP 261* 217* 262*   D-Dimer Recent Labs    05/12/22 1740  DDIMER 0.67*   Hgb A1c Recent Labs    05/13/22 0632  HGBA1C 6.8*   Lipid Profile No results for input(s): "CHOL", "HDL", "LDLCALC", "TRIG", "CHOLHDL", "LDLDIRECT" in the last 72 hours. Thyroid function studies No results for input(s): "TSH", "T4TOTAL", "T3FREE", "THYROIDAB" in the last 72 hours.  Invalid input(s): "FREET3" Anemia work up No results for input(s): "VITAMINB12", "FOLATE", "FERRITIN", "TIBC", "IRON", "RETICCTPCT" in the last 72 hours. Urinalysis    Component Value Date/Time   COLORURINE STRAW (A) 04/24/2022 1158   APPEARANCEUR CLEAR (A) 04/24/2022 1158   APPEARANCEUR Turbid (A) 09/18/2021 1546   LABSPEC 1.009 04/24/2022 1158   LABSPEC 1.011 06/28/2014 1724   PHURINE 5.0 04/24/2022 1158   GLUCOSEU 50 (A) 04/24/2022 1158   GLUCOSEU Negative 06/28/2014 1724   HGBUR MODERATE (A) 04/24/2022 1158   BILIRUBINUR NEGATIVE 04/24/2022 1158   BILIRUBINUR Negative 09/18/2021 1546   BILIRUBINUR Negative 06/28/2014 1724   KETONESUR NEGATIVE 04/24/2022 1158   PROTEINUR NEGATIVE 04/24/2022 1158   NITRITE NEGATIVE 04/24/2022 1158   LEUKOCYTESUR NEGATIVE 04/24/2022 1158   LEUKOCYTESUR Negative 06/28/2014 1724   Sepsis Labs Recent Labs  Lab 05/12/22 1740 05/13/22 0632  WBC 13.5* 13.2*   Microbiology Recent Results (from the past 240 hour(s))  Resp panel by RT-PCR (RSV, Flu A&B, Covid) Anterior Nasal Swab     Status: None   Collection Time: 05/12/22  5:40 PM   Specimen: Anterior Nasal Swab  Result Value Ref Range Status   SARS Coronavirus 2 by RT PCR NEGATIVE NEGATIVE Final    Comment: (NOTE) SARS-CoV-2 target nucleic acids are NOT DETECTED.  The SARS-CoV-2 RNA is generally detectable in upper respiratory specimens during the acute phase of infection. The  lowest concentration of SARS-CoV-2 viral copies this assay can detect is 138 copies/mL. A negative result does not preclude SARS-Cov-2 infection and should not be used as the sole basis for treatment or other patient management decisions. A negative result may occur with  improper specimen collection/handling, submission of specimen other than nasopharyngeal swab, presence of viral mutation(s) within the areas targeted by this assay, and inadequate number of viral copies(<138 copies/mL). A negative  result must be combined with clinical observations, patient history, and epidemiological information. The expected result is Negative.  Fact Sheet for Patients:  EntrepreneurPulse.com.au  Fact Sheet for Healthcare Providers:  IncredibleEmployment.be  This test is no t yet approved or cleared by the [place] FDA and  has been authorized for detection and/or diagnosis of SARS-CoV-2 by FDA under an Emergency Use Authorization (EUA). This EUA will remain  in effect (meaning this test can be used) for the duration of the COVID-19 declaration under Section 564(b)(1) of the Act, 21 U.S.C.section 360bbb-3(b)(1), unless the authorization is terminated  or revoked sooner.       Influenza A by PCR NEGATIVE NEGATIVE Final   Influenza B by PCR NEGATIVE NEGATIVE Final    Comment: (NOTE) The Xpert Xpress SARS-CoV-2/FLU/RSV plus assay is intended as an aid in the diagnosis of influenza from Nasopharyngeal swab specimens and should not be used as a sole basis for treatment. Nasal washings and aspirates are unacceptable for Xpert Xpress SARS-CoV-2/FLU/RSV testing.  Fact Sheet for Patients: EntrepreneurPulse.com.au  Fact Sheet for Healthcare Providers: IncredibleEmployment.be  This test is not yet approved or cleared by the [place] FDA and has been authorized for detection and/or diagnosis of SARS-CoV-2 by FDA under  an Emergency Use Authorization (EUA). This EUA will remain in effect (meaning this test can be used) for the duration of the COVID-19 declaration under Section 564(b)(1) of the Act, 21 U.S.C. section 360bbb-3(b)(1), unless the authorization is terminated or revoked.     Resp Syncytial Virus by PCR NEGATIVE NEGATIVE Final    Comment: (NOTE) Fact Sheet for Patients: EntrepreneurPulse.com.au  Fact Sheet for Healthcare Providers: IncredibleEmployment.be  This test is not yet approved or cleared by the [place] FDA and has been authorized for detection and/or diagnosis of SARS-CoV-2 by FDA under an Emergency Use Authorization (EUA). This EUA will remain in effect (meaning this test can be used) for the duration of the COVID-19 declaration under Section 564(b)(1) of the Act, 21 U.S.C. section 360bbb-3(b)(1), unless the authorization is terminated or revoked.  Performed at [facility], [address] [place], [place] [address]   MRSA Next Gen by PCR, Nasal     Status: None   Collection Time: 05/13/22  9:57 AM   Specimen: Nasal Mucosa; Nasal Swab  Result Value Ref Range Status   MRSA by PCR Next Gen NOT DETECTED NOT DETECTED Final    Comment: (NOTE) The GeneXpert MRSA Assay (FDA approved for NASAL specimens only), is one component of a comprehensive MRSA colonization surveillance program. It is not intended to diagnose MRSA infection nor to guide or monitor treatment for MRSA infections. Test performance is not FDA approved in patients less than 77 years old. Performed at [facility], [address] [place], [place] [address]      Total time spend on discharging this patient, including the last patient exam, discussing the hospital stay, instructions for ongoing care as it relates to all pertinent caregivers, as well as preparing the medical discharge records, prescriptions, and/or referrals as applicable, is 45  minutes.    Enzo Bi, MD  [facility] 05/13/2022, 6:21 PM

## 2022-05-13 NOTE — Care Management CC44 (Signed)
Condition Code 44 Documentation Completed  Patient Details  Name: Summer Murphy MRN: [id number] Date of Birth: [birth year]   Condition Code 44 given:  Yes Patient signature on Condition Code 44 notice:  Yes Documentation of 2 MD's agreement:  Yes Code 44 added to claim:  Yes    Laurena Slimmer, RN 05/13/2022, 3:42 PM

## 2022-05-13 NOTE — Care Management Obs Status (Signed)
[place] NOTIFICATION   Patient Details  Name: ONNA NAHAR MRN: [id number] Date of Birth: 03/09/1956   Medicare Observation Status Notification Given:  Yes    Laurena Slimmer, RN 05/13/2022, 3:42 PM

## 2022-05-13 NOTE — Progress Notes (Signed)
Inpatient Diabetes Program Recommendations  AACE/ADA: New Consensus Statement on Inpatient Glycemic Control (2015)  Target Ranges:  Prepandial:   less than 140 mg/dL      Peak postprandial:   less than 180 mg/dL (1-2 hours)      Critically ill patients:  140 - 180 mg/dL   Lab Results  Component Value Date   GLUCAP 217 (H) 05/13/2022   HGBA1C 5.9 (A) 08/28/2021    Review of Glycemic Control  Latest Reference Range & Units 05/12/22 21:45 05/13/22 08:06  Glucose-Capillary 70 - 99 mg/dL 261 (H) 217 (H)   Diabetes history: None Outpatient Diabetes medications: None Current orders for Inpatient glycemic control:  Novolog 0-6 units tid with meals and HS Prednisone 40 mg daily  Inpatient Diabetes Program Recommendations:    Note blood sugars increased likely due to steroids.   Consider increasing Novolog correction to sensitive (0-9 units) tid with meals.    Will follow.   Thanks,  Adah Perl, RN, BC-ADM Inpatient Diabetes Coordinator Pager [phone]  (8a-5p)

## 2022-05-13 NOTE — Evaluation (Signed)
Physical Therapy Evaluation Patient Details Name: Summer Murphy MRN: [id number] DOB: 1955/09/29 Today's Date: 05/13/2022  History of Present Illness  Patient is a 67 year old female with chest pain and shortness of breath and inability to lie flat. Recent pneumonia with hospitalization 2/2-2/4. History of COPD, restless leg syndrome   Clinical Impression  Patient is agreeable to PT. She is independent at baseline and lives alone in an apartment that has elevator access.   She is generally anxious today. No physical assistance required with mobility. The patient is Modified independent with all mobility tasks. She ambulated a lap in the hallway without device. Sp02 93% or higher on room air. Educated patient on general energy conservation tips to use in home setting. No apparent acute PT needs at this time.      Recommendations for follow up therapy are one component of a multi-disciplinary discharge planning process, led by the attending physician.  Recommendations may be updated based on patient status, additional functional criteria and insurance authorization.  Follow Up Recommendations No PT follow up      Assistance Recommended at Discharge PRN  Patient can return home with the following  Assist for transportation    Equipment Recommendations None recommended by PT  Recommendations for Other Services       Functional Status Assessment Patient has not had a recent decline in their functional status     Precautions / Restrictions Precautions Precautions: None Restrictions Weight Bearing Restrictions: No      Mobility  Bed Mobility Overal bed mobility: Modified Independent                  Transfers Overall transfer level: Modified independent                      Ambulation/Gait Ambulation/Gait assistance: Modified independent (Device/Increase time) Gait Distance (Feet): 200 Feet Assistive device: None Gait Pattern/deviations: Step-through  pattern       General Gait Details: occasional cues to decrease cadence and to take standing rest breaks as needed. Sp02 93% or higher on room air with ambulation  Stairs            Wheelchair Mobility    Modified Rankin (Stroke Patients Only)       Balance Overall balance assessment: Modified Independent                                           Pertinent Vitals/Pain Pain Assessment Pain Assessment: No/denies pain    Home Living Family/patient expects to be discharged to:: Private residence Living Arrangements: Alone Available Help at Discharge: Friend(s);Available PRN/intermittently Type of Home: Apartment Home Access: Elevator       Home Layout: One level Home Equipment: Shower seat - built in      Prior Function Prior Level of Function : Independent/Modified Independent             Mobility Comments: recently weaker due to acute illnesses/hosptializations, amb with no AD ADLs Comments: MOD I for ADL, assist for IADL, reports feeling generally weak     Hand Dominance        Extremity/Trunk Assessment   Upper Extremity Assessment Upper Extremity Assessment: Overall WFL for tasks assessed    Lower Extremity Assessment Lower Extremity Assessment: Overall WFL for tasks assessed       Communication   Communication: No difficulties  Cognition  Arousal/Alertness: Awake/alert Behavior During Therapy: WFL for tasks assessed/performed Overall Cognitive Status: Within Functional Limits for tasks assessed                                          General Comments General comments (skin integrity, edema, etc.): patient appers generally anxious during mobility. energy conservation tips provided for use in home setting    Exercises     Assessment/Plan    PT Assessment Patient does not need any further PT services  PT Problem List         PT Treatment Interventions      PT Goals (Current goals can be found  in the Care Plan section)  Acute Rehab PT Goals PT Goal Formulation: All assessment and education complete, DC therapy    Frequency       Co-evaluation               AM-PAC PT "6 Clicks" Mobility  Outcome Measure Help needed turning from your back to your side while in a flat bed without using bedrails?: None Help needed moving from lying on your back to sitting on the side of a flat bed without using bedrails?: None Help needed moving to and from a bed to a chair (including a wheelchair)?: None Help needed standing up from a chair using your arms (e.g., wheelchair or bedside chair)?: None Help needed to walk in hospital room?: None Help needed climbing 3-5 steps with a railing? : None 6 Click Score: 24    End of Session   Activity Tolerance: Patient tolerated treatment well Patient left: in bed;with call bell/phone within reach;with nursing/sitter in room Nurse Communication: Mobility status PT Visit Diagnosis: Muscle weakness (generalized) (M62.81)    Time: [id number] PT Time Calculation (min) (ACUTE ONLY): 13 min   Charges:   PT Evaluation $PT Eval Low Complexity: 1 Low         Minna Merritts, PT, MPT   Percell Locus 05/13/2022, 3:09 PM

## 2022-05-13 NOTE — Evaluation (Signed)
Occupational Therapy Evaluation Patient Details Name: Summer Murphy MRN: [id number] DOB: 05/08/1955 Today's Date: 05/13/2022   History of Present Illness Pt is a 67 year old female ospitalized from 2/2 to 2/4 with community-acquired pneumonia who returns to the ED with chest pain and shortness of breath and inability to lie flat, admitted with multifocal pneumonia, sepsis, COPD exacerbation, hyperglycemia, hypertension urgency; PMH significant for  COPD/asthma Gold stage II, hiatal hernia, hypertension, hyperlipidemia, restless leg syndrome, on room air at baseline, anxiety/depression, migraines   Clinical Impression   Chart reviewed, pt greeted in room agreeable to OT evaluation. Pt is alert and oriented x4, appropriate carry over of education provided. PTA pt reports she is MOD I-I in ADL, has assist for IADL as needed (does grocery delivery/meals on wheels), amb with no AD. Pt presents with deficits in activity tolerance affecting safe and optimal ADL completion. Hand out provided re: energy conservation techniques with appropriate carry over during evaluation. OT will follow pt acutely to facilitate continued carry over of energy conservation techniques, no OT needs identified following discharge at this time. OT will continue to follow acutely.      Recommendations for follow up therapy are one component of a multi-disciplinary discharge planning process, led by the attending physician.  Recommendations may be updated based on patient status, additional functional criteria and insurance authorization.   Follow Up Recommendations  No OT follow up     Assistance Recommended at Discharge Intermittent Supervision/Assistance  Patient can return home with the following Assistance with cooking/housework;Help with stairs or ramp for entrance;Assist for transportation    Functional Status Assessment  Patient has had a recent decline in their functional status and demonstrates the ability to  make significant improvements in function in a reasonable and predictable amount of time.  Equipment Recommendations  None recommended by OT    Recommendations for Other Services       Precautions / Restrictions Precautions Precautions: None Restrictions Weight Bearing Restrictions: No      Mobility Bed Mobility Overal bed mobility: Modified Independent                  Transfers Overall transfer level: Needs assistance   Transfers: Sit to/from Stand Sit to Stand: Supervision                  Balance Overall balance assessment: Needs assistance Sitting-balance support: Feet supported Sitting balance-Leahy Scale: Good     Standing balance support: No upper extremity supported Standing balance-Leahy Scale: Good                             ADL either performed or assessed with clinical judgement   ADL Overall ADL's : Needs assistance/impaired Eating/Feeding: Sitting;Set up   Grooming: Oral care;Wash/dry face;Standing;Supervision/safety               Lower Body Dressing: Supervision/safety;Sitting/lateral leans   Toilet Transfer: Supervision/safety;Ambulation;Regular Museum/gallery exhibitions officer and Hygiene: Supervision/safety       Functional mobility during ADLs: Supervision/safety General ADL Comments: frequent vcs throughout for use of energy conservation techniques     Vision Patient Visual Report: No change from baseline       Perception     Praxis      Pertinent Vitals/Pain Pain Assessment Pain Assessment: No/denies pain     Hand Dominance     Extremity/Trunk Assessment Upper Extremity Assessment Upper Extremity Assessment: Overall WFL for tasks assessed  Lower Extremity Assessment Lower Extremity Assessment: Overall WFL for tasks assessed       Communication Communication Communication: No difficulties   Cognition Arousal/Alertness: Awake/alert Behavior During Therapy: WFL for tasks  assessed/performed Overall Cognitive Status: Within Functional Limits for tasks assessed                                       General Comments  vital signs appear stable throughout    Exercises Other Exercises Other Exercises: edu re: role of OT, role of rehab, discharge recommendations, home safety, falls prevention, hand out provided re: energy conservation techniques   Shoulder Instructions      Home Living Family/patient expects to be discharged to:: Private residence Living Arrangements: Alone Available Help at Discharge: Friend(s);Available PRN/intermittently Type of Home: Apartment Home Access: Elevator (second story)     Home Layout: One level     Bathroom Shower/Tub: Chief Strategy Officer: Shower seat - built in          Prior Functioning/Environment Prior Level of Function : Independent/Modified Independent             Mobility Comments: recently weaker due to acute illnesses/hosptializations, amb with no AD ADLs Comments: MOD I for ADL, assist for IADL, reports feeling generally weak        OT Problem List: Decreased activity tolerance      OT Treatment/Interventions: Self-care/ADL training;Therapeutic exercise;Therapeutic activities;Energy conservation;DME and/or AE instruction;Patient/family education    OT Goals(Current goals can be found in the care plan section) Acute Rehab OT Goals Patient Stated Goal: return home OT Goal Formulation: With patient Time For Goal Achievement: 05/26/22 Potential to Achieve Goals: Good ADL Goals Pt Will Perform Grooming: with modified independence Pt Will Perform Lower Body Dressing: with modified independence;sit to/from stand Pt Will Transfer to Toilet: with modified independence;ambulating Pt Will Perform Toileting - Clothing Manipulation and hygiene: with modified independence  OT Frequency: Min 2X/week    Co-evaluation              AM-PAC OT "6 Clicks" Daily  Activity     Outcome Measure Help from another person eating meals?: None Help from another person taking care of personal grooming?: None Help from another person toileting, which includes using toliet, bedpan, or urinal?: None Help from another person bathing (including washing, rinsing, drying)?: A Little Help from another person to put on and taking off regular upper body clothing?: None Help from another person to put on and taking off regular lower body clothing?: None 6 Click Score: 23   End of Session    Activity Tolerance: Patient tolerated treatment well Patient left: in chair;with call bell/phone within reach  OT Visit Diagnosis: Other abnormalities of gait and mobility (R26.89)                Time: [id number] OT Time Calculation (min): 14 min Charges:  OT General Charges $OT Visit: 1 Visit OT Evaluation $OT Eval Low Complexity: 1 Low  Shanon Payor, OTD OTR/L  05/13/22, 2:12 PM

## 2022-05-13 NOTE — Progress Notes (Addendum)
Nutrition Brief Note  RD consulted for assessment of nutrition requirements/ status.   Wt Readings from Last 15 Encounters:  05/13/22 74.2 kg  05/11/22 74.2 kg  05/06/22 75.4 kg  04/28/22 74.8 kg  04/24/22 76.2 kg  04/22/22 74.4 kg  04/09/22 74.7 kg  03/24/22 73.8 kg  03/08/22 79.8 kg  01/28/22 75.7 kg  01/15/22 74.1 kg  12/31/21 74.6 kg  12/11/21 76.1 kg  12/03/21 75.9 kg  10/28/21 75.3 kg   Pt with medical history significant for COPD/asthma Gold stage II, hiatal hernia, hypertension, hyperlipidemia, restless leg syndrome, on room air at baseline, anxiety/depression, migraines, hospitalized from 2/2 to 2/4 with community-acquired pneumonia who presents with chest pain and shortness of breath and inability to lie flat.  Pt admitted with COPD exacerbation.   Reviewed I/O's: +865 ml x 24 hours  UOP: 500 ml x 24 hours  Pt lying in bed, talking at phone at time of visit.   Nutrition-Focused physical exam completed. Findings are no fat depletion, no muscle depletion, and no edema.    Case discussed with food services department. Pt with documented food allergy to vanilla. Spoke with pt, who reports that she get severe anaphylactic shock if she consumes vanilla extract. However, pt admits to eating vanilla flavored foods, such as ice cream.   Wt has been stable over the past 6 months.   Medications reviewed and include prednisone.   Lab Results  Component Value Date   HGBA1C 5.9 (A) 08/28/2021   PTA DM medications are none.   Labs reviewed: CBGS: 217-261 (inpatient orders for glycemic control are 0-5 units insulin aspart daily at bedtime and 0-6 units insulin aspart TID with meals). Suspect CBGS increased secondary to steroid use. DM coordinator recommending increasing correction scale to 0-9 units TID.   Current diet order is heart healthy/ carb modified (liberalized diet to carb modified), patient is consuming approximately 100% of meals at this time. Labs and medications  reviewed.   No nutrition interventions warranted at this time. If nutrition issues arise, please consult RD.   Loistine Chance, RD, LDN, [place] Registered Dietitian II Certified Diabetes Care and Education Specialist Please refer to [facility] for RD and/or RD on-call/weekend/after hours pager

## 2022-05-14 ENCOUNTER — Other Ambulatory Visit: Payer: Self-pay | Admitting: Nurse Practitioner

## 2022-05-14 DIAGNOSIS — Z760 Encounter for issue of repeat prescription: Secondary | ICD-10-CM

## 2022-05-15 ENCOUNTER — Encounter: Payer: Self-pay | Admitting: Nurse Practitioner

## 2022-05-15 ENCOUNTER — Ambulatory Visit (INDEPENDENT_AMBULATORY_CARE_PROVIDER_SITE_OTHER): Payer: Self-pay | Admitting: Physician Assistant

## 2022-05-15 ENCOUNTER — Encounter: Payer: Self-pay | Admitting: Physician Assistant

## 2022-05-15 VITALS — BP 130/70 | HR 100 | Temp 98.3°F | Resp 16 | Ht 63.0 in | Wt 165.6 lb

## 2022-05-15 DIAGNOSIS — Z8701 Personal history of pneumonia (recurrent): Secondary | ICD-10-CM

## 2022-05-15 DIAGNOSIS — J4540 Moderate persistent asthma, uncomplicated: Secondary | ICD-10-CM

## 2022-05-15 DIAGNOSIS — K449 Diaphragmatic hernia without obstruction or gangrene: Secondary | ICD-10-CM

## 2022-05-15 DIAGNOSIS — Z09 Encounter for follow-up examination after completed treatment for conditions other than malignant neoplasm: Secondary | ICD-10-CM

## 2022-05-15 NOTE — Progress Notes (Signed)
[facility], [facility] [address]  Internal MEDICINE  Office Visit Note  Patient Name: Summer Murphy  [id number]  [id number]  Date of Service: 05/15/2022     Chief Complaint  Patient presents with   [facility] f/u    Hospitalization Follow-up   Pneumonia     HPI Pt is here for recent hospital follow up. -She went to ED on 05/12/22 and was admitted overnight then discharged on 05/13/22 -She was previously treated for PNA and discharged on 2/4, but went back to ED on 2/20 due to worsening SOB. She had a CT done that showed improvement with WBC at 13, also improved from last encounter. She was given IV solumedrol and breathing improved. It was determined that this was now a new episode of PNA that require ABX and was discharged on a long steroid taper. -Today she is feeling much better -Using spray, taking steroid taper and neb as needed -Monitoring oxygen levels at home as well -SOB mainly with exertion, and then will rest if needed. Minimal wheezing. -She is having some leg pains but not cramping, started in the hospital. Thinks prednisone causing this aching and is tapering down as instructed and should improve. It is tolerable currently -Will reach out to schedule surgery for hiatal hernia which is also impacting breathing -She did miss last PFT due to being too short of breath and will plan to reschedule this once feeling better  Current Medication: Outpatient Encounter Medications as of 05/15/2022  Medication Sig   acetaminophen-codeine (TYLENOL #3) 300-30 MG tablet Take 1 tablet by mouth every 6 (six) hours as needed for moderate pain.   benzonatate (TESSALON) 100 MG capsule Take 1 capsule (100 mg total) by mouth 2 (two) times daily as needed for cough. Home med.   busPIRone (BUSPAR) 10 MG tablet Take 1 tablet (10 mg total) by mouth 2 (two) times daily.   clonazePAM (KLONOPIN) 1 MG tablet Take 0.5 tablets (0.5 mg total) by mouth 2  (two) times daily as needed for anxiety.   diclofenac Sodium (VOLTAREN) 1 % GEL Apply 4 g topically 4 (four) times daily.   diltiazem (CARTIA XT) 240 MG 24 hr capsule Take 1 capsule (240 mg total) by mouth daily.   DULoxetine (CYMBALTA) 60 MG capsule Take 1 capsule (60 mg total) by mouth at bedtime.   EPINEPHrine 0.3 mg/0.3 mL IJ SOAJ injection Inject 0.3 mg into the muscle as needed for anaphylaxis.   fluticasone (FLONASE) 50 MCG/ACT nasal spray Place 2 sprays into both nostrils daily as needed for rhinitis or allergies. Home med.   fluticasone furoate-vilanterol (BREO ELLIPTA) 100-25 MCG/ACT AEPB Inhale 1 puff into the lungs daily.   gabapentin (NEURONTIN) 600 MG tablet Take 1 tablet (600 mg total) by mouth 2 (two) times daily.   hydrOXYzine (ATARAX) 25 MG tablet Take 1-2 tablets (25-50 mg total) by mouth every 6 (six) hours as needed for itching.   Ipratropium-Albuterol (COMBIVENT RESPIMAT) 20-100 MCG/ACT AERS respimat INHALE 1 PUFF INTO THE LUNGS EVERY 6 HOURS   ipratropium-albuterol (DUONEB) 0.5-2.5 (3) MG/3ML SOLN Take 3 mLs by nebulization every 4 (four) hours as needed (for shortness of breath/wheezing).   loratadine (CLARITIN) 10 MG tablet Take 1 tablet (10 mg total) by mouth daily as needed for allergies, rhinitis or itching. Home med.   montelukast (SINGULAIR) 10 MG tablet Take 1 tablet (10 mg total) by mouth at bedtime.   ondansetron (ZOFRAN) 4 MG tablet TAKE 1  TABLET BY MOUTH TWICE DAILY AS NEEDED   pantoprazole (PROTONIX) 40 MG tablet Take 40 mg by mouth 2 (two) times daily.   pilocarpine (SALAGEN) 5 MG tablet Take 5 mg by mouth 2 (two) times daily.   predniSONE (DELTASONE) 10 MG tablet Take 40 mg (4 tablets) from 2/22 to 2/25, then 3 tablets from 2/26 to 2/29, then 2 tablets from 3/1 to 3/4, then 1 tablet from 3/5 until you see Dr. Humphrey Rolls.   rOPINIRole (REQUIP) 0.5 MG tablet TAKE 2 TABLETS BY MOUTH TWICE DAILY FOR RESTLESS LEGS   sennosides-docusate sodium (SENOKOT-S) 8.6-50 MG tablet  Take 2 tablets by mouth daily as needed for constipation. Home med   SUMAtriptan (IMITREX) 100 MG tablet Take 1 tablet (100 mg total) by mouth every 2 (two) hours as needed (for migraine headaches.). May repeat in 1 hours if headache persists or recurs.   traZODone (DESYREL) 150 MG tablet TAKE 1 TABLET(150 MG) BY MOUTH AT BEDTIME   No facility-administered encounter medications on file as of 05/15/2022.    Surgical History: Past Surgical History:  Procedure Laterality Date   ABDOMINAL HYSTERECTOMY     AMPUTATION TOE Left 07/31/2016   Procedure: AMPUTATION TOE/MPJ 2nd toe;  Surgeon: Sharlotte Alamo, DPM;  Location: [facility] ORS;  Service: Podiatry;  Laterality: Left;   APPENDECTOMY  1990   BACK SURGERY     low back   BREAST SURGERY     bilateral breast reduction   CARDIAC ELECTROPHYSIOLOGY STUDY AND ABLATION     CHOLECYSTECTOMY  1990   COLONOSCOPY WITH PROPOFOL N/A 01/04/2017   Procedure: COLONOSCOPY WITH PROPOFOL;  Surgeon: Manya Silvas, MD;  Location: [facility] ENDOSCOPY;  Service: Endoscopy;  Laterality: N/A;   CORNEAL TRANSPLANT     ESOPHAGOGASTRODUODENOSCOPY N/A 04/09/2021   Procedure: ESOPHAGOGASTRODUODENOSCOPY (EGD);  Surgeon: Lin Landsman, MD;  Location: [facility] ENDOSCOPY;  Service: Gastroenterology;  Laterality: N/A;   ESOPHAGOGASTRODUODENOSCOPY (EGD) WITH PROPOFOL N/A 01/04/2017   Procedure: ESOPHAGOGASTRODUODENOSCOPY (EGD) WITH PROPOFOL;  Surgeon: Manya Silvas, MD;  Location[facility] ENDOSCOPY;  Service: Endoscopy;  Laterality: N/A;   EXCISION BONE CYST Left 07/31/2016   Procedure: EXCISION BONE CYST/exostectomy 28124/left 2nd;  Surgeon: Sharlotte Alamo, DPM;  Location: [facility] ORS;  Service: Podiatry;  Laterality: Left;   EXTRACORPOREAL SHOCK WAVE LITHOTRIPSY Left 09/12/2015   Procedure: EXTRACORPOREAL SHOCK WAVE LITHOTRIPSY (ESWL);  Surgeon: Hollice Espy, MD;  Location: [facility] ORS;  Service: Urology;  Laterality: Left;   FRACTURE SURGERY     left foot   HARDWARE REMOVAL Left  11/14/2020   Procedure: HARDWARE REMOVAL;  Surgeon: Hessie Knows, MD;  Location: [facility] ORS;  Service: Orthopedics;  Laterality: Left;   [place] repair     Fundoplication   JOINT REPLACEMENT Bilateral 2013,2014   total knees   LAPAROSCOPIC HYSTERECTOMY     LITHOTRIPSY     periprosthetic supracondylar fracture of left femur  02/16/2020   [facility]   TONSILLECTOMY     TOTAL KNEE REVISION Right 11/14/2019   Procedure: Revision patella and tibial polyethylene;  Surgeon: Hessie Knows, MD;  Location: [facility] ORS;  Service: Orthopedics;  Laterality: Right;   URETEROSCOPY      Medical History: Past Medical History:  Diagnosis Date   Acid reflux    Anemia    Anxiety    Arrhythmia    treated with meds and has no current problems   Arthritis    most uncomfortable in knees   Asthma    uses several inhalers   Depression  Fever blister    Hematuria    History of kidney stones    Hypertension    Hypoglycemia    Left flank pain    Migraine    Pre-diabetes    Restless leg    Yeast vaginitis     Family History: Family History  Problem Relation Age of Onset   Hypertension Mother    Stroke Mother    Prostate cancer Father    Kidney Stones Father    Diabetes Brother    Hypertension Brother    Breast cancer Maternal Aunt    Kidney disease Neg Hx     Social History   Socioeconomic History   Marital status: Widowed    Spouse name: Not on file   Number of children: Not on file   Years of education: Not on file   Highest education level: Not on file  Occupational History   Not on file  Tobacco Use   Smoking status: Never    Passive exposure: Past   Smokeless tobacco: Never  Vaping Use   Vaping Use: Never used  Substance and Sexual Activity   Alcohol use: No   Drug use: No   Sexual activity: Not on file  Other Topics Concern   Not on file  Social History Narrative   Not on file   Social Determinants of Health   Financial Resource Strain: Not on file  Food  Insecurity: No Food Insecurity (05/13/2022)   Hunger Vital Sign    Worried About Running Out of Food in the Last Year: Never true    Ran Out of Food in the Last Year: Never true  Transportation Needs: No Transportation Needs (05/13/2022)   PRAPARE - Hydrologist (Medical): No    Lack of Transportation (Non-Medical): No  Physical Activity: Not on file  Stress: Not on file  Social Connections: Not on file  Intimate Partner Violence: Not At Risk (05/13/2022)   Humiliation, Afraid, Rape, and Kick questionnaire    Fear of Current or Ex-Partner: No    Emotionally Abused: No    Physically Abused: No    Sexually Abused: No      Review of Systems  Constitutional:  Positive for activity change and fatigue.  HENT: Negative.    Respiratory:  Positive for shortness of breath and wheezing.   Cardiovascular:  Negative for chest pain and palpitations.  Gastrointestinal:  Positive for abdominal pain.  Musculoskeletal:  Positive for myalgias.  Psychiatric/Behavioral:  Nervous/anxious: increased anxiety due to SOB.     Vital Signs: BP 130/70 Comment: 121/112  Pulse 100   Temp 98.3 F (36.8 C)   Resp 16   Ht '5\' 3"'$  (1.6 m)   Wt 165 lb 9.6 oz (75.1 kg)   SpO2 92%   BMI 29.33 kg/m    Physical Exam Vitals reviewed.  Constitutional:      General: She is not in acute distress.    Appearance: Normal appearance. She is not ill-appearing.  HENT:     Head: Normocephalic and atraumatic.  Eyes:     Pupils: Pupils are equal, round, and reactive to light.  Cardiovascular:     Rate and Rhythm: Normal rate and regular rhythm.     Heart sounds: Normal heart sounds. No murmur heard. Pulmonary:     Effort: Pulmonary effort is normal. No respiratory distress.     Breath sounds: Normal breath sounds. No wheezing or rhonchi.  Neurological:     Mental Status: She  is alert and oriented to person, place, and time.  Psychiatric:        Mood and Affect: Mood normal.         Behavior: Behavior normal.       Assessment/Plan: 1. Hospital discharge follow-up Reviewed hospital course and medication updates  2. History of recent pneumonia Improving on recent hospital imaging. Continue steroid taper and neb as needed.   3. Moderate persistent asthma without complication Will reschedule PFT once feeling better  4. Large hiatal hernia Will follow up with surgery   General Counseling: Quentina verbalizes understanding of the findings of todays visit and agrees with plan of treatment. I have discussed any further diagnostic evaluation that may be needed or ordered today. We also reviewed her medications today. she has been encouraged to call the office with any questions or concerns that should arise related to todays visit.    Counseling:    No orders of the defined types were placed in this encounter.   This patient was seen by Drema Dallas, PA-C in collaboration with Dr. Clayborn Bigness as a part of collaborative care agreement.   I have reviewed all medical records from hospital follow up including radiology reports and consults from other physicians. Appropriate follow up diagnostics will be scheduled as needed. Patient/ Family understands the plan of treatment. Time spent 35 minutes.   Dr Lavera Guise, MD Internal Medicine

## 2022-05-17 LAB — CULTURE, BLOOD (ROUTINE X 2)
Culture: NO GROWTH
Culture: NO GROWTH
Special Requests: ADEQUATE

## 2022-05-25 ENCOUNTER — Encounter: Payer: Self-pay | Admitting: Internal Medicine

## 2022-05-25 ENCOUNTER — Ambulatory Visit
Admission: RE | Admit: 2022-05-25 | Discharge: 2022-05-25 | Disposition: A | Discharge status: Home / Self Care | Payer: 59 | Attending: Internal Medicine | Admitting: Internal Medicine

## 2022-05-25 ENCOUNTER — Ambulatory Visit
Admission: RE | Admit: 2022-05-25 | Discharge: 2022-05-25 | Disposition: A | Discharge status: Home / Self Care | Payer: 59 | Source: Ambulatory Visit | Attending: Internal Medicine | Admitting: Internal Medicine

## 2022-05-25 ENCOUNTER — Ambulatory Visit (INDEPENDENT_AMBULATORY_CARE_PROVIDER_SITE_OTHER): Payer: Self-pay | Admitting: Internal Medicine

## 2022-05-25 VITALS — BP 144/83 | HR 100 | Temp 98.2°F | Resp 16 | Ht 63.0 in | Wt 166.0 lb

## 2022-05-25 DIAGNOSIS — K219 Gastro-esophageal reflux disease without esophagitis: Secondary | ICD-10-CM

## 2022-05-25 DIAGNOSIS — G63 Polyneuropathy in diseases classified elsewhere: Secondary | ICD-10-CM | POA: Diagnosis not present

## 2022-05-25 DIAGNOSIS — J4540 Moderate persistent asthma, uncomplicated: Secondary | ICD-10-CM | POA: Diagnosis not present

## 2022-05-25 DIAGNOSIS — K449 Diaphragmatic hernia without obstruction or gangrene: Secondary | ICD-10-CM | POA: Diagnosis not present

## 2022-05-25 NOTE — Patient Instructions (Signed)
Asthma, Adult  Asthma is a condition that causes swelling and narrowing of the airways. These are the passages that lead from the nose and mouth down into the lungs. When asthma symptoms get worse it is called an asthma attack or flare. This can make it hard to breathe. Asthma flares can range from minor to life-threatening. There is no cure for asthma, but medicines and lifestyle changes can help to control it. What are the causes? It is not known exactly what causes asthma, but certain things can cause asthma symptoms to get worse (triggers). What can trigger an asthma attack? Cigarette smoke. Mold. Dust. Your pet's skin flakes (dander). Cockroaches. Pollen. Air pollution (like household cleaners, wood smoke, smog, or chemical odors). What are the signs or symptoms? Trouble breathing (shortness of breath). Coughing. Making high-pitched whistling sounds when you breathe, most often when you breathe out (wheezing). Chest tightness. Tiredness with little activity. Poor exercise tolerance. How is this treated? Controller medicines that help prevent asthma symptoms. Fast-acting reliever or rescue medicines. These give short-term relief of asthma symptoms. Allergy medicines if your attacks are brought on by allergens. Medicines to help control the body's defense (immune) system. Staying away from the things that cause asthma attacks. Follow these instructions at home: Avoiding triggers in your home Do not allow anyone to smoke in your home. Limit use of fireplaces and wood stoves. Get rid of pests (such as roaches and mice) and their droppings. Keep your home clean. Clean your floors. Dust regularly. Use cleaning products that do not smell. Wash bed sheets and blankets every week in hot water. Dry them in a dryer. Have someone vacuum when you are not home. Change your heating and air conditioning filters often. Use blankets that are made of polyester or cotton. General  instructions Take over-the-counter and prescription medicines only as told by your doctor. Do not smoke or use any products that contain nicotine or tobacco. If you need help quitting, ask your doctor. Stay away from secondhand smoke. Avoid doing things outdoors when allergen counts are high and when air quality is low. Warm up before you exercise. Take time to cool down after exercise. Use a peak flow meter as told by your doctor. A peak flow meter is a tool that measures how well your lungs are working. Keep track of the peak flow meter's readings. Write them down. Follow your asthma action plan. This is a written plan for taking care of your asthma and treating your attacks. Make sure you get all the shots (vaccines) that your doctor recommends. Ask your doctor about a flu shot and a pneumonia shot. Keep all follow-up visits. Contact a doctor if: You have wheezing, shortness of breath, or a cough even while taking medicine to prevent attacks. The mucus you cough up (sputum) is thicker than usual. The mucus you cough up changes from clear or white to yellow, green, gray, or is bloody. You have problems from the medicine you are taking, such as: A rash. Itching. Swelling. Trouble breathing. You need reliever medicines more than 2-3 times a week. Your peak flow reading is still at 50-79% of your personal best after following the action plan for 1 hour. You have a fever. Get help right away if: You seem to be worse and are not responding to medicine during an asthma attack. You are short of breath even at rest. You get short of breath when doing very little activity. You have trouble eating, drinking, or talking. You have chest   pain or tightness. You have a fast heartbeat. Your lips or fingernails start to turn blue. You are light-headed or dizzy, or you faint. Your peak flow is less than 50% of your personal best. You feel too tired to breathe normally. These symptoms may be an  emergency. Get help right away. Call 911. Do not wait to see if the symptoms will go away. Do not drive yourself to the hospital. Summary Asthma is a long-term (chronic) condition in which the airways get tight and narrow. An asthma attack can make it hard to breathe. Asthma cannot be cured, but medicines and lifestyle changes can help control it. Make sure you understand how to avoid triggers and how and when to use your medicines. Avoid things that can cause allergy symptoms (allergens). These include animal skin flakes (dander) and pollen from trees or grass. Avoid things that pollute the air. These may include household cleaners, wood smoke, smog, or chemical odors. This information is not intended to replace advice given to you by your health care provider. Make sure you discuss any questions you have with your health care provider. Document Revised: 12/16/2020 Document Reviewed: 12/16/2020 [employer] Patient Education  2023 [employer]  

## 2022-05-25 NOTE — Progress Notes (Signed)
[facility] [place], [place] [address]  Pulmonary Sleep Medicine   Office Visit Note  Patient Name: Summer Murphy DOB: [birth year] MRN [id number]  Date of Service: 05/25/2022  Complaints/HPI: She states she is still having shortness of breath. She states she is on inhalers combivent on breo. She states she is not using the breo. States she is going to get the inhaler so she can use it according to recommendation. She states right now she is not having a cough. She has not seen surgery for the [facility]  ROS  General: (-) fever, (-) chills, (-) night sweats, (-) weakness Skin: (-) rashes, (-) itching,. Eyes: (-) visual changes, (-) redness, (-) itching. Nose and Sinuses: (-) nasal stuffiness or itchiness, (-) postnasal drip, (-) nosebleeds, (-) sinus trouble. Mouth and Throat: (-) sore throat, (-) hoarseness. Neck: (-) swollen glands, (-) enlarged thyroid, (-) neck pain. Respiratory: - cough, (-) bloody sputum, + shortness of breath, - wheezing. Cardiovascular: - ankle swelling, (-) chest pain. Lymphatic: (-) lymph node enlargement. Neurologic: (-) numbness, (-) tingling. Psychiatric: (-) anxiety, (-) depression   Current Medication: Outpatient Encounter Medications as of 05/25/2022  Medication Sig   acetaminophen-codeine (TYLENOL #3) 300-30 MG tablet Take 1 tablet by mouth every 6 (six) hours as needed for moderate pain.   benzonatate (TESSALON) 100 MG capsule Take 1 capsule (100 mg total) by mouth 2 (two) times daily as needed for cough. Home med.   busPIRone (BUSPAR) 10 MG tablet Take 1 tablet (10 mg total) by mouth 2 (two) times daily.   clonazePAM (KLONOPIN) 1 MG tablet Take 0.5 tablets (0.5 mg total) by mouth 2 (two) times daily as needed for anxiety.   diclofenac Sodium (VOLTAREN) 1 % GEL Apply 4 g topically 4 (four) times daily.   diltiazem (CARTIA XT) 240 MG 24 hr capsule Take 1 capsule (240 mg total) by mouth daily.   DULoxetine (CYMBALTA) 60 MG  capsule Take 1 capsule (60 mg total) by mouth at bedtime.   EPINEPHrine 0.3 mg/0.3 mL IJ SOAJ injection Inject 0.3 mg into the muscle as needed for anaphylaxis.   fluticasone (FLONASE) 50 MCG/ACT nasal spray Place 2 sprays into both nostrils daily as needed for rhinitis or allergies. Home med.   fluticasone furoate-vilanterol (BREO ELLIPTA) 100-25 MCG/ACT AEPB Inhale 1 puff into the lungs daily.   gabapentin (NEURONTIN) 600 MG tablet Take 1 tablet (600 mg total) by mouth 2 (two) times daily.   hydrOXYzine (ATARAX) 25 MG tablet Take 1-2 tablets (25-50 mg total) by mouth every 6 (six) hours as needed for itching.   Ipratropium-Albuterol (COMBIVENT RESPIMAT) 20-100 MCG/ACT AERS respimat INHALE 1 PUFF INTO THE LUNGS EVERY 6 HOURS   ipratropium-albuterol (DUONEB) 0.5-2.5 (3) MG/3ML SOLN Take 3 mLs by nebulization every 4 (four) hours as needed (for shortness of breath/wheezing).   loratadine (CLARITIN) 10 MG tablet Take 1 tablet (10 mg total) by mouth daily as needed for allergies, rhinitis or itching. Home med.   montelukast (SINGULAIR) 10 MG tablet Take 1 tablet (10 mg total) by mouth at bedtime.   ondansetron (ZOFRAN) 4 MG tablet TAKE 1 TABLET BY MOUTH TWICE DAILY AS NEEDED   pantoprazole (PROTONIX) 40 MG tablet Take 40 mg by mouth 2 (two) times daily.   pilocarpine (SALAGEN) 5 MG tablet Take 5 mg by mouth 2 (two) times daily.   predniSONE (DELTASONE) 10 MG tablet Take 40 mg (4 tablets) from 2/22 to 2/25, then 3 tablets from 2/26 to 2/29, then  2 tablets from 3/1 to 3/4, then 1 tablet from 3/5 until you see Dr. Humphrey Rolls.   rOPINIRole (REQUIP) 0.5 MG tablet TAKE 2 TABLETS BY MOUTH TWICE DAILY FOR RESTLESS LEGS   sennosides-docusate sodium (SENOKOT-S) 8.6-50 MG tablet Take 2 tablets by mouth daily as needed for constipation. Home med   SUMAtriptan (IMITREX) 100 MG tablet Take 1 tablet (100 mg total) by mouth every 2 (two) hours as needed (for migraine headaches.). May repeat in 1 hours if headache persists or  recurs.   traZODone (DESYREL) 150 MG tablet TAKE 1 TABLET(150 MG) BY MOUTH AT BEDTIME   No facility-administered encounter medications on file as of 05/25/2022.    Surgical History: Past Surgical History:  Procedure Laterality Date   ABDOMINAL HYSTERECTOMY     AMPUTATION TOE Left 07/31/2016   Procedure: AMPUTATION TOE/MPJ 2nd toe;  Surgeon: Sharlotte Alamo, DPM;  Location: [facility] ORS;  Service: Podiatry;  Laterality: Left;   APPENDECTOMY  1990   BACK SURGERY     low back   BREAST SURGERY     bilateral breast reduction   CARDIAC ELECTROPHYSIOLOGY STUDY AND ABLATION     CHOLECYSTECTOMY  1990   COLONOSCOPY WITH PROPOFOL N/A 01/04/2017   Procedure: COLONOSCOPY WITH PROPOFOL;  Surgeon: Manya Silvas, MD;  Location: [facility] ENDOSCOPY;  Service: Endoscopy;  Laterality: N/A;   CORNEAL TRANSPLANT     ESOPHAGOGASTRODUODENOSCOPY N/A 04/09/2021   Procedure: ESOPHAGOGASTRODUODENOSCOPY (EGD);  Surgeon: Lin Landsman, MD;  Location: [facility] ENDOSCOPY;  Service: Gastroenterology;  Laterality: N/A;   ESOPHAGOGASTRODUODENOSCOPY (EGD) WITH PROPOFOL N/A 01/04/2017   Procedure: ESOPHAGOGASTRODUODENOSCOPY (EGD) WITH PROPOFOL;  Surgeon: Manya Silvas, MD;  [facility];  Service: Endoscopy;  Laterality: N/A;   EXCISION BONE CYST Left 07/31/2016   Procedure: EXCISION BONE CYST/exostectomy 28124/left 2nd;  Surgeon: Sharlotte Alamo, DPM;  Location: [facility] ORS;  Service: Podiatry;  Laterality: Left;   EXTRACORPOREAL SHOCK WAVE LITHOTRIPSY Left 09/12/2015   Procedure: EXTRACORPOREAL SHOCK WAVE LITHOTRIPSY (ESWL);  Surgeon: Hollice Espy, MD;  Location: [facility] ORS;  Service: Urology;  Laterality: Left;   FRACTURE SURGERY     left foot   HARDWARE REMOVAL Left 11/14/2020   Procedure: HARDWARE REMOVAL;  Surgeon: Hessie Knows, MD;  Location: [facility] ORS;  Service: Orthopedics;  Laterality: Left;   Cameron repair     Fundoplication   JOINT REPLACEMENT Bilateral 2013,2014   total knees   LAPAROSCOPIC HYSTERECTOMY      LITHOTRIPSY     periprosthetic supracondylar fracture of left femur  02/16/2020   [facility]   TONSILLECTOMY     TOTAL KNEE REVISION Right 11/14/2019   Procedure: Revision patella and tibial polyethylene;  Surgeon: Hessie Knows, MD;  Location: [facility] ORS;  Service: Orthopedics;  Laterality: Right;   URETEROSCOPY      Medical History: Past Medical History:  Diagnosis Date   Acid reflux    Anemia    Anxiety    Arrhythmia    treated with meds and has no current problems   Arthritis    most uncomfortable in knees   Asthma    uses several inhalers   Depression    Fever blister    Hematuria    History of kidney stones    Hypertension    Hypoglycemia    Left flank pain    Migraine    Pre-diabetes    Restless leg    Yeast vaginitis     Family History: Family History  Problem Relation Age of Onset   Hypertension Mother  Stroke Mother    Prostate cancer Father    Kidney Stones Father    Diabetes Brother    Hypertension Brother    Breast cancer Maternal Aunt    Kidney disease Neg Hx     Social History: Social History   Socioeconomic History   Marital status: Widowed    Spouse name: Not on file   Number of children: Not on file   Years of education: Not on file   Highest education level: Not on file  Occupational History   Not on file  Tobacco Use   Smoking status: Never    Passive exposure: Past   Smokeless tobacco: Never  Vaping Use   Vaping Use: Never used  Substance and Sexual Activity   Alcohol use: No   Drug use: No   Sexual activity: Not on file  Other Topics Concern   Not on file  Social History Narrative   Not on file   Social Determinants of Health   Financial Resource Strain: Not on file  Food Insecurity: No Food Insecurity (05/13/2022)   Hunger Vital Sign    Worried About Running Out of Food in the Last Year: Never true    Ran Out of Food in the Last Year: Never true  Transportation Needs: No Transportation Needs (05/13/2022)    PRAPARE - Hydrologist (Medical): No    Lack of Transportation (Non-Medical): No  Physical Activity: Not on file  Stress: Not on file  Social Connections: Not on file  Intimate Partner Violence: Not At Risk (05/13/2022)   Humiliation, Afraid, Rape, and Kick questionnaire    Fear of Current or Ex-Partner: No    Emotionally Abused: No    Physically Abused: No    Sexually Abused: No    Vital Signs: Blood pressure (!) 144/83, pulse 100, temperature 98.2 F (36.8 C), resp. rate 16, height '5\' 3"'$  (1.6 m), weight 166 lb (75.3 kg), SpO2 93 %.  Examination: General Appearance: The patient is well-developed, well-nourished, and in no distress. Skin: Gross inspection of skin unremarkable. Head: normocephalic, no gross deformities. Eyes: no gross deformities noted. ENT: ears appear grossly normal no exudates. Neck: Supple. No thyromegaly. No LAD. Respiratory: few rhonchi. Cardiovascular: Normal S1 and S2 without murmur or rub. Extremities: No cyanosis. pulses are equal. Neurologic: Alert and oriented. No involuntary movements.  LABS: Recent Results (from the past 2160 hour(s))  Urinalysis, Routine w reflex microscopic -Urine, Clean Catch     Status: Abnormal   Collection Time: 04/24/22 11:58 AM  Result Value Ref Range   Color, Urine STRAW (A) YELLOW   APPearance CLEAR (A) CLEAR   Specific Gravity, Urine 1.009 1.005 - 1.030   pH 5.0 5.0 - 8.0   Glucose, UA 50 (A) NEGATIVE mg/dL   Hgb urine dipstick MODERATE (A) NEGATIVE   Bilirubin Urine NEGATIVE NEGATIVE   Ketones, ur NEGATIVE NEGATIVE mg/dL   Protein, ur NEGATIVE NEGATIVE mg/dL   Nitrite NEGATIVE NEGATIVE   Leukocytes,Ua NEGATIVE NEGATIVE   RBC / HPF 6-10 0 - 5 RBC/hpf   WBC, UA 0-5 0 - 5 WBC/hpf   Bacteria, UA RARE (A) NONE SEEN   Squamous Epithelial / HPF 0-5 0 - 5 /HPF   Mucus PRESENT     Comment: Performed at [facility], [address] [place], [place] [address]  Comprehensive  metabolic panel     Status: Abnormal   Collection Time: 04/24/22 11:59 AM  Result Value Ref Range  Sodium 136 135 - 145 mmol/L   Potassium 3.7 3.5 - 5.1 mmol/L    Comment: HEMOLYSIS AT THIS LEVEL MAY AFFECT RESULT   Chloride 103 98 - 111 mmol/L   CO2 26 22 - 32 mmol/L   Glucose, Bld 167 (H) 70 - 99 mg/dL    Comment: Glucose reference range applies only to samples taken after fasting for at least 8 hours.   BUN 14 8 - 23 mg/dL   Creatinine, Ser 0.79 0.44 - 1.00 mg/dL   Calcium 8.2 (L) 8.9 - 10.3 mg/dL   Total Protein 6.1 (L) 6.5 - 8.1 g/dL   Albumin 3.5 3.5 - 5.0 g/dL   AST 24 15 - 41 U/L   ALT 26 0 - 44 U/L   Alkaline Phosphatase 73 38 - 126 U/L   Total Bilirubin 0.7 0.3 - 1.2 mg/dL   GFR, Estimated >60 >60 mL/min    Comment: (NOTE) Calculated using the CKD-EPI Creatinine Equation (2021)    Anion gap 7 5 - 15    Comment: Performed at [facility], [address] [place], [place] [address]  Lactic acid, plasma     Status: Abnormal   Collection Time: 04/24/22 11:59 AM  Result Value Ref Range   Lactic Acid, Venous 2.2 (HH) 0.5 - 1.9 mmol/L    Comment: CRITICAL RESULT CALLED TO, READ BACK BY AND VERIFIED WITH [facility] ASHBURN AT 1246 04/24/22 DAS Performed at [facility], [address] [place], [place] [address]   CBC with Differential     Status: Abnormal   Collection Time: 04/24/22 11:59 AM  Result Value Ref Range   WBC 14.7 (H) 4.0 - 10.5 K/uL   RBC 4.02 3.87 - 5.11 MIL/uL   Hemoglobin 11.8 (L) 12.0 - 15.0 g/dL   HCT 37.2 36.0 - 46.0 %   MCV 92.5 80.0 - 100.0 fL   MCH 29.4 26.0 - 34.0 pg   MCHC 31.7 30.0 - 36.0 g/dL   RDW 13.3 11.5 - 15.5 %   Platelets 213 150 - 400 K/uL   nRBC 0.0 0.0 - 0.2 %   Neutrophils Relative % 92 %   Neutro Abs 13.4 (H) 1.7 - 7.7 K/uL   Lymphocytes Relative 5 %   Lymphs Abs 0.8 0.7 - 4.0 K/uL   Monocytes Relative 2 %   Monocytes Absolute 0.3 0.1 - 1.0 K/uL   Eosinophils Relative 1 %   Eosinophils Absolute 0.1 0.0 - 0.5  K/uL   Basophils Relative 0 %   Basophils Absolute 0.0 0.0 - 0.1 K/uL   Immature Granulocytes 0 %   Abs Immature Granulocytes 0.06 0.00 - 0.07 K/uL    Comment: Performed at [facility], [address] [place], [place] [address]  Protime-INR     Status: None   Collection Time: 04/24/22 11:59 AM  Result Value Ref Range   Prothrombin Time 12.4 11.4 - 15.2 seconds   INR 0.9 0.8 - 1.2    Comment: (NOTE) INR goal varies based on device and disease states. Performed at [facility], [address] [place], [place] [address]   Culture, blood (Routine x 2)     Status: None   Collection Time: 04/24/22 11:59 AM   Specimen: BLOOD RIGHT WRIST  Result Value Ref Range   Specimen Description BLOOD RIGHT WRIST    Special Requests      BOTTLES DRAWN AEROBIC ONLY Blood Culture results may not be optimal due to an inadequate volume of blood  received in culture bottles   Culture      NO GROWTH 5 DAYS Performed at [facility] [address] [place], [place] [address]    Report Status 04/29/2022 FINAL   Culture, blood (Routine x 2)     Status: None   Collection Time: 04/24/22 11:59 AM   Specimen: BLOOD RIGHT ARM  Result Value Ref Range   Specimen Description BLOOD RIGHT ARM    Special Requests      BOTTLES DRAWN AEROBIC AND ANAEROBIC Blood Culture adequate volume   Culture      NO GROWTH 5 DAYS Performed at [facility], [address], [place], [place] [address]    Report Status 04/29/2022 FINAL   Troponin I (High Sensitivity)     Status: None   Collection Time: 04/24/22 11:59 AM  Result Value Ref Range   Troponin I (High Sensitivity) 7 <18 ng/L    Comment: (NOTE) Elevated high sensitivity troponin I (hsTnI) values and significant  changes across serial measurements may suggest ACS but many other  chronic and acute conditions are known to elevate hsTnI results.  Refer to the "Links" section for chest pain algorithms and additional   guidance. Performed at [facility], [place], [place] [address]   Legionella Pneumophila Serogp 1 Ur Ag     Status: None   Collection Time: 04/24/22 11:59 AM  Result Value Ref Range   L. pneumophila Serogp 1 Ur Ag Negative Negative    Comment: (NOTE) Presumptive negative for L. pneumophila serogroup 1 antigen in urine, suggesting no recent or current infection. Legionnaires' disease cannot be ruled out since other serogroups and species may also cause disease. Performed At: [facility] [place], [place] [id number] Rush Farmer MD [id number]    Source of Sample URINE, RANDOM     Comment: Performed at [facility], [address] [place], [place] [address]  Resp panel by RT-PCR (RSV, Flu A&B, Covid) Anterior Nasal Swab     Status: None   Collection Time: 04/24/22 12:02 PM   Specimen: Anterior Nasal Swab  Result Value Ref Range   SARS Coronavirus 2 by RT PCR NEGATIVE NEGATIVE    Comment: (NOTE) SARS-CoV-2 target nucleic acids are NOT DETECTED.  The SARS-CoV-2 RNA is generally detectable in upper respiratory specimens during the acute phase of infection. The lowest concentration of SARS-CoV-2 viral copies this assay can detect is 138 copies/mL. A negative result does not preclude SARS-Cov-2 infection and should not be used as the sole basis for treatment or other patient management decisions. A negative result may occur with  improper specimen collection/handling, submission of specimen other than nasopharyngeal swab, presence of viral mutation(s) within the areas targeted by this assay, and inadequate number of viral copies(<138 copies/mL). A negative result must be combined with clinical observations, patient history, and epidemiological information. The expected result is Negative.  Fact Sheet for Patients:  EntrepreneurPulse.com.au  Fact Sheet for Healthcare Providers:   IncredibleEmployment.be  This test is no t yet approved or cleared by the [place] FDA and  has been authorized for detection and/or diagnosis of SARS-CoV-2 by FDA under an Emergency Use Authorization (EUA). This EUA will remain  in effect (meaning this test can be used) for the duration of the COVID-19 declaration under Section 564(b)(1) of the Act, 21 U.S.C.section 360bbb-3(b)(1), unless the authorization is terminated  or revoked sooner.       Influenza A by PCR NEGATIVE NEGATIVE  Influenza B by PCR NEGATIVE NEGATIVE    Comment: (NOTE) The Xpert Xpress SARS-CoV-2/FLU/RSV plus assay is intended as an aid in the diagnosis of influenza from Nasopharyngeal swab specimens and should not be used as a sole basis for treatment. Nasal washings and aspirates are unacceptable for Xpert Xpress SARS-CoV-2/FLU/RSV testing.  Fact Sheet for Patients: EntrepreneurPulse.com.au  Fact Sheet for Healthcare Providers: IncredibleEmployment.be  This test is not yet approved or cleared by the [place] FDA and has been authorized for detection and/or diagnosis of SARS-CoV-2 by FDA under an Emergency Use Authorization (EUA). This EUA will remain in effect (meaning this test can be used) for the duration of the COVID-19 declaration under Section 564(b)(1) of the Act, 21 U.S.C. section 360bbb-3(b)(1), unless the authorization is terminated or revoked.     Resp Syncytial Virus by PCR NEGATIVE NEGATIVE    Comment: (NOTE) Fact Sheet for Patients: EntrepreneurPulse.com.au  Fact Sheet for Healthcare Providers: IncredibleEmployment.be  This test is not yet approved or cleared by the [place] FDA and has been authorized for detection and/or diagnosis of SARS-CoV-2 by FDA under an Emergency Use Authorization (EUA). This EUA will remain in effect (meaning this test can be used) for the duration of  the COVID-19 declaration under Section 564(b)(1) of the Act, 21 U.S.C. section 360bbb-3(b)(1), unless the authorization is terminated or revoked.  Performed at [facility], [address] [place], [place] [address]   Blood gas, venous     Status: Abnormal   Collection Time: 04/24/22 12:02 PM  Result Value Ref Range   pH, Ven 7.4 7.25 - 7.43   pCO2, Ven 41 (L) 44 - 60 mmHg   pO2, Ven 59 (H) 32 - 45 mmHg   Bicarbonate 25.4 20.0 - 28.0 mmol/L   Acid-Base Excess 0.5 0.0 - 2.0 mmol/L   O2 Saturation 88.3 %   Patient temperature 37.0    Collection site VEIN     Comment: Performed at [facility], [place], [place] [address]  Troponin I (High Sensitivity)     Status: None   Collection Time: 04/24/22  2:21 PM  Result Value Ref Range   Troponin I (High Sensitivity) 9 <18 ng/L    Comment: (NOTE) Elevated high sensitivity troponin I (hsTnI) values and significant  changes across serial measurements may suggest ACS but many other  chronic and acute conditions are known to elevate hsTnI results.  Refer to the "Links" section for chest pain algorithms and additional  guidance. Performed at [facility], [address] [place], [place] [address]   Lactic acid, plasma     Status: None   Collection Time: 04/24/22  2:21 PM  Result Value Ref Range   Lactic Acid, Venous 1.7 0.5 - 1.9 mmol/L    Comment: Performed at [facility], [address] [place], [place] [address]  Lactic acid, plasma     Status: Abnormal   Collection Time: 04/24/22  7:24 PM  Result Value Ref Range   Lactic Acid, Venous 2.3 (HH) 0.5 - 1.9 mmol/L    Comment: CRITICAL RESULT CALLED TO, READ BACK BY AND VERIFIED WITH [facility] 04/24/2022 AT 2005 SRR Performed at [facility], [address] [place], [place] [address]   Mycoplasma pneumoniae antibody, IgM     Status: None   Collection Time: 04/24/22  7:24 PM  Result Value Ref Range   Mycoplasma  pneumo IgM <770 0 - 769 U/mL    Comment: (NOTE)  Negative            <770 Clinically significant amount of M. pneumoniae antibody not detected.                             Low Positive   770 - 60 M. pneumoniae specific IgM presumptively detected.  It is recommended that another sample be collected 1-2 weeks later to assure reactivity.                             Positive            >950 Highly significant amount of M. pneumoniae specific IgM antibody detected. Performed At: [facility] [place], [place] [id number] Rush Farmer MD [id number]   CBC     Status: Abnormal   Collection Time: 04/25/22  4:26 AM  Result Value Ref Range   WBC 15.2 (H) 4.0 - 10.5 K/uL   RBC 4.07 3.87 - 5.11 MIL/uL   Hemoglobin 11.8 (L) 12.0 - 15.0 g/dL   HCT 37.6 36.0 - 46.0 %   MCV 92.4 80.0 - 100.0 fL   MCH 29.0 26.0 - 34.0 pg   MCHC 31.4 30.0 - 36.0 g/dL   RDW 13.4 11.5 - 15.5 %   Platelets 199 150 - 400 K/uL   nRBC 0.0 0.0 - 0.2 %    Comment: Performed at [facility], [address] [place], [place] [id number]  Basic metabolic panel     Status: Abnormal   Collection Time: 04/25/22  4:26 AM  Result Value Ref Range   Sodium 130 (L) 135 - 145 mmol/L   Potassium 4.0 3.5 - 5.1 mmol/L   Chloride 101 98 - 111 mmol/L   CO2 22 22 - 32 mmol/L   Glucose, Bld 214 (H) 70 - 99 mg/dL    Comment: Glucose reference range applies only to samples taken after fasting for at least 8 hours.   BUN 11 8 - 23 mg/dL   Creatinine, Ser 0.72 0.44 - 1.00 mg/dL   Calcium 7.0 (L) 8.9 - 10.3 mg/dL   GFR, Estimated >60 >60 mL/min    Comment: (NOTE) Calculated using the CKD-EPI Creatinine Equation (2021)    Anion gap 7 5 - 15    Comment: Performed at [facility], [address] [place], [place] [address]  HIV Antibody (routine testing w rflx)     Status: None   Collection Time: 04/25/22  4:26 AM  Result Value Ref Range   HIV Screen 4th Generation  wRfx Non Reactive Non Reactive    Comment: Performed at [facility], [employer] [address] [place], [place] [address]  CBC with Differential     Status: Abnormal   Collection Time: 05/12/22  5:40 PM  Result Value Ref Range   WBC 13.5 (H) 4.0 - 10.5 K/uL   RBC 4.79 3.87 - 5.11 MIL/uL   Hemoglobin 13.6 12.0 - 15.0 g/dL   HCT 42.2 36.0 - 46.0 %   MCV 88.1 80.0 - 100.0 fL   MCH 28.4 26.0 - 34.0 pg   MCHC 32.2 30.0 - 36.0 g/dL   RDW 13.2 11.5 - 15.5 %   Platelets 316 150 - 400 K/uL   nRBC 0.0 0.0 - 0.2 %   Neutrophils Relative % 93 %   Neutro Abs 12.6 (H) 1.7 - 7.7 K/uL   Lymphocytes Relative 6 %  Lymphs Abs 0.8 0.7 - 4.0 K/uL   Monocytes Relative 1 %   Monocytes Absolute 0.2 0.1 - 1.0 K/uL   Eosinophils Relative 0 %   Eosinophils Absolute 0.0 0.0 - 0.5 K/uL   Basophils Relative 0 %   Basophils Absolute 0.0 0.0 - 0.1 K/uL   Immature Granulocytes 0 %   Abs Immature Granulocytes 0.04 0.00 - 0.07 K/uL    Comment: Performed at [facility], [address] [place], [place] [address]  Comprehensive metabolic panel     Status: Abnormal   Collection Time: 05/12/22  5:40 PM  Result Value Ref Range   Sodium 136 135 - 145 mmol/L   Potassium 4.3 3.5 - 5.1 mmol/L   Chloride 98 98 - 111 mmol/L   CO2 25 22 - 32 mmol/L   Glucose, Bld 225 (H) 70 - 99 mg/dL    Comment: Glucose reference range applies only to samples taken after fasting for at least 8 hours.   BUN 23 8 - 23 mg/dL   Creatinine, Ser 0.81 0.44 - 1.00 mg/dL   Calcium 10.4 (H) 8.9 - 10.3 mg/dL   Total Protein 7.5 6.5 - 8.1 g/dL   Albumin 3.9 3.5 - 5.0 g/dL   AST 26 15 - 41 U/L   ALT 49 (H) 0 - 44 U/L   Alkaline Phosphatase 107 38 - 126 U/L   Total Bilirubin 0.6 0.3 - 1.2 mg/dL   GFR, Estimated >60 >60 mL/min    Comment: (NOTE) Calculated using the CKD-EPI Creatinine Equation (2021)    Anion gap 13 5 - 15    Comment: Performed at [facility], [address] [place], [place] [address]  Lipase, blood      Status: None   Collection Time: 05/12/22  5:40 PM  Result Value Ref Range   Lipase 25 11 - 51 U/L    Comment: Performed at [facility], [place], [place] [address]  Troponin I (High Sensitivity)     Status: None   Collection Time: 05/12/22  5:40 PM  Result Value Ref Range   Troponin I (High Sensitivity) 7 <18 ng/L    Comment: (NOTE) Elevated high sensitivity troponin I (hsTnI) values and significant  changes across serial measurements may suggest ACS but many other  chronic and acute conditions are known to elevate hsTnI results.  Refer to the "Links" section for chest pain algorithms and additional  guidance. Performed at [facility], [address] [place], [place] [address]   Brain natriuretic peptide     Status: None   Collection Time: 05/12/22  5:40 PM  Result Value Ref Range   B Natriuretic Peptide 40.9 0.0 - 100.0 pg/mL    Comment: Performed at [facility], [place] [address] [place],  [address]  Resp panel by RT-PCR (RSV, Flu A&B, Covid) Anterior Nasal Swab     Status: None   Collection Time: 05/12/22  5:40 PM   Specimen: Anterior Nasal Swab  Result Value Ref Range   SARS Coronavirus 2 by RT PCR NEGATIVE NEGATIVE    Comment: (NOTE) SARS-CoV-2 target nucleic acids are NOT DETECTED.  The SARS-CoV-2 RNA is generally detectable in upper respiratory specimens during the acute phase of infection. The lowest concentration of SARS-CoV-2 viral copies this assay can detect is 138 copies/mL. A negative result does not preclude SARS-Cov-2 infection and should not be used as the sole basis for treatment or other patient management decisions. A negative result may  occur with  improper specimen collection/handling, submission of specimen other than nasopharyngeal swab, presence of viral mutation(s) within the areas targeted by this assay, and inadequate number of viral copies(<138 copies/mL). A negative result must be combined  with clinical observations, patient history, and epidemiological information. The expected result is Negative.  Fact Sheet for Patients:  EntrepreneurPulse.com.au  Fact Sheet for Healthcare Providers:  IncredibleEmployment.be  This test is no t yet approved or cleared by the [place] FDA and  has been authorized for detection and/or diagnosis of SARS-CoV-2 by FDA under an Emergency Use Authorization (EUA). This EUA will remain  in effect (meaning this test can be used) for the duration of the COVID-19 declaration under Section 564(b)(1) of the Act, 21 U.S.C.section 360bbb-3(b)(1), unless the authorization is terminated  or revoked sooner.       Influenza A by PCR NEGATIVE NEGATIVE   Influenza B by PCR NEGATIVE NEGATIVE    Comment: (NOTE) The Xpert Xpress SARS-CoV-2/FLU/RSV plus assay is intended as an aid in the diagnosis of influenza from Nasopharyngeal swab specimens and should not be used as a sole basis for treatment. Nasal washings and aspirates are unacceptable for Xpert Xpress SARS-CoV-2/FLU/RSV testing.  Fact Sheet for Patients: EntrepreneurPulse.com.au  Fact Sheet for Healthcare Providers: IncredibleEmployment.be  This test is not yet approved or cleared by the [place] FDA and has been authorized for detection and/or diagnosis of SARS-CoV-2 by FDA under an Emergency Use Authorization (EUA). This EUA will remain in effect (meaning this test can be used) for the duration of the COVID-19 declaration under Section 564(b)(1) of the Act, 21 U.S.C. section 360bbb-3(b)(1), unless the authorization is terminated or revoked.     Resp Syncytial Virus by PCR NEGATIVE NEGATIVE    Comment: (NOTE) Fact Sheet for Patients: EntrepreneurPulse.com.au  Fact Sheet for Healthcare Providers: IncredibleEmployment.be  This test is not yet approved or cleared by the  [place] FDA and has been authorized for detection and/or diagnosis of SARS-CoV-2 by FDA under an Emergency Use Authorization (EUA). This EUA will remain in effect (meaning this test can be used) for the duration of the COVID-19 declaration under Section 564(b)(1) of the Act, 21 U.S.C. section 360bbb-3(b)(1), unless the authorization is terminated or revoked.  Performed at [facility], [address] [place], [place] [address]   Blood gas, venous     Status: Abnormal   Collection Time: 05/12/22  5:40 PM  Result Value Ref Range   pH, Ven 7.47 (H) 7.25 - 7.43   pCO2, Ven 37 (L) 44 - 60 mmHg   pO2, Ven 63 (H) 32 - 45 mmHg   Bicarbonate 26.9 20.0 - 28.0 mmol/L   Acid-Base Excess 3.2 (H) 0.0 - 2.0 mmol/L   O2 Saturation 91.4 %   Patient temperature 37.0    Collection site VEIN     Comment: Performed at [facility], [address] [place], [place] [address]  Lactic acid, plasma     Status: Abnormal   Collection Time: 05/12/22  5:40 PM  Result Value Ref Range   Lactic Acid, Venous 2.6 (HH) 0.5 - 1.9 mmol/L    Comment: CRITICAL RESULT CALLED TO, READ BACK BY AND VERIFIED WITH LISA GEISLER ON 05/12/22 '@1831'$  RP Performed at [facility], [place], [place] [address]   Procalcitonin - Baseline     Status: None   Collection Time: 05/12/22  5:40 PM  Result Value Ref Range   Procalcitonin 0.26 ng/mL    Comment:  Interpretation: PCT (Procalcitonin) <= 0.5 ng/mL: Systemic infection (sepsis) is not likely. Local bacterial infection is possible. (NOTE)       Sepsis PCT Algorithm           Lower Respiratory Tract                                      Infection PCT Algorithm    ----------------------------     ----------------------------         PCT < 0.25 ng/mL                PCT < 0.10 ng/mL          Strongly encourage             Strongly discourage   discontinuation of antibiotics    initiation of antibiotics     ----------------------------     -----------------------------       PCT 0.25 - 0.50 ng/mL            PCT 0.10 - 0.25 ng/mL               OR       >80% decrease in PCT            Discourage initiation of                                            antibiotics      Encourage discontinuation           of antibiotics    ----------------------------     -----------------------------         PCT >= 0.50 ng/mL              PCT 0.26 - 0.50 ng/mL               AND        <80% decrease in PCT             Encourage initiation of                                             antibiotics       Encourage continuation           of antibiotics    ----------------------------     -----------------------------        PCT >= 0.50 ng/mL                  PCT > 0.50 ng/mL               AND         increase in PCT                  Strongly encourage                                      initiation of antibiotics    Strongly encourage escalation           of antibiotics                                     -----------------------------  PCT <= 0.25 ng/mL                                                 OR                                        > 80% decrease in PCT                                      Discontinue / Do not initiate                                             antibiotics  Performed at [facility], [address] [place], [place] [address]   D-dimer, quantitative     Status: Abnormal   Collection Time: 05/12/22  5:40 PM  Result Value Ref Range   D-Dimer, Quant 0.67 (H) 0.00 - 0.50 ug/mL-FEU    Comment: (NOTE) At the manufacturer cut-off value of 0.5 g/mL FEU, this assay has a negative predictive value of 95-100%.This assay is intended for use in conjunction with a clinical pretest probability (PTP) assessment model to exclude pulmonary embolism (PE) and deep venous thrombosis (DVT) in outpatients suspected of PE or DVT. Results should  be correlated with clinical presentation. Performed at [facility], [address] [place], [place] [address]   Blood culture (routine x 2)     Status: None   Collection Time: 05/12/22  5:48 PM   Specimen: BLOOD  Result Value Ref Range   Specimen Description BLOOD RIGHT ANTECUBITAL    Special Requests      BOTTLES DRAWN AEROBIC AND ANAEROBIC Blood Culture adequate volume   Culture      NO GROWTH 5 DAYS Performed at [facility], [address] [place], [place] [address]    Report Status 05/17/2022 FINAL   Blood culture (routine x 2)     Status: None   Collection Time: 05/12/22  6:25 PM   Specimen: BLOOD  Result Value Ref Range   Specimen Description BLOOD BLOOD LEFT HAND    Special Requests      BOTTLES DRAWN AEROBIC AND ANAEROBIC Blood Culture results may not be optimal due to an inadequate volume of blood received in culture bottles   Culture      NO GROWTH 5 DAYS Performed at [facility], [address], [place], [place] [address]    Report Status 05/17/2022 FINAL   Lactic acid, plasma     Status: None   Collection Time: 05/12/22  7:19 PM  Result Value Ref Range   Lactic Acid, Venous 1.7 0.5 - 1.9 mmol/L    Comment: Performed at [facility], [address], [place],  [address]  Troponin I (High Sensitivity)     Status: None   Collection Time: 05/12/22  7:19 PM  Result Value Ref Range   Troponin I (High Sensitivity) 5 <18 ng/L    Comment: (NOTE) Elevated high sensitivity troponin I (hsTnI) values and significant  changes across serial measurements may suggest ACS but  many other  chronic and acute conditions are known to elevate hsTnI results.  Refer to the "Links" section for chest pain algorithms and additional  guidance. Performed at [facility], [address] [place], [place] [address]   Glucose, capillary     Status: Abnormal   Collection Time: 05/12/22  9:45 PM  Result Value Ref Range    Glucose-Capillary 261 (H) 70 - 99 mg/dL    Comment: Glucose reference range applies only to samples taken after fasting for at least 8 hours.  Procalcitonin     Status: None   Collection Time: 05/13/22  6:32 AM  Result Value Ref Range   Procalcitonin <0.10 ng/mL    Comment:        Interpretation: PCT (Procalcitonin) <= 0.5 ng/mL: Systemic infection (sepsis) is not likely. Local bacterial infection is possible. (NOTE)       Sepsis PCT Algorithm           Lower Respiratory Tract                                      Infection PCT Algorithm    ----------------------------     ----------------------------         PCT < 0.25 ng/mL                PCT < 0.10 ng/mL          Strongly encourage             Strongly discourage   discontinuation of antibiotics    initiation of antibiotics    ----------------------------     -----------------------------       PCT 0.25 - 0.50 ng/mL            PCT 0.10 - 0.25 ng/mL               OR       >80% decrease in PCT            Discourage initiation of                                            antibiotics      Encourage discontinuation           of antibiotics    ----------------------------     -----------------------------         PCT >= 0.50 ng/mL              PCT 0.26 - 0.50 ng/mL               AND        <80% decrease in PCT             Encourage initiation of                                             antibiotics       Encourage continuation           of antibiotics    ----------------------------     -----------------------------        PCT >= 0.50 ng/mL  PCT > 0.50 ng/mL               AND         increase in PCT                  Strongly encourage                                      initiation of antibiotics    Strongly encourage escalation           of antibiotics                                     -----------------------------                                           PCT <= 0.25 ng/mL                                                  OR                                        > 80% decrease in PCT                                      Discontinue / Do not initiate                                             antibiotics  Performed at [facility], [address] [place], [place] [address]   Hemoglobin A1c     Status: Abnormal   Collection Time: 05/13/22  6:32 AM  Result Value Ref Range   Hgb A1c MFr Bld 6.8 (H) 4.8 - 5.6 %    Comment: (NOTE) Pre diabetes:          5.7%-6.4%  Diabetes:              >6.4%  Glycemic control for   <7.0% adults with diabetes    Mean Plasma Glucose 148.46 mg/dL    Comment: Performed at [facility], [place] [address], [place], [place] [address]  Protime-INR     Status: None   Collection Time: 05/13/22  6:32 AM  Result Value Ref Range   Prothrombin Time 14.1 11.4 - 15.2 seconds   INR 1.1 0.8 - 1.2    Comment: (NOTE) INR goal varies based on device and disease states. Performed at [facility], [address] [place], [place] [address]   Cortisol-am, blood     Status: Abnormal   Collection Time: 05/13/22  6:32 AM  Result Value Ref Range   Cortisol - AM 2.4 (L) 6.7 - 22.6 ug/dL    Comment: Performed at [facility] [address], [place], [place]  [id number]  CBC     Status: Abnormal   Collection Time: 05/13/22  6:32 AM  Result Value Ref Range   WBC 13.2 (H) 4.0 - 10.5 K/uL   RBC 4.01 3.87 - 5.11 MIL/uL   Hemoglobin 11.6 (L) 12.0 - 15.0 g/dL   HCT 35.8 (L) 36.0 - 46.0 %   MCV 89.3 80.0 - 100.0 fL   MCH 28.9 26.0 - 34.0 pg   MCHC 32.4 30.0 - 36.0 g/dL   RDW 13.4 11.5 - 15.5 %   Platelets 249 150 - 400 K/uL   nRBC 0.0 0.0 - 0.2 %    Comment: Performed at [facility], [address] [place], [place] [id number]  Basic metabolic panel     Status: Abnormal   Collection Time: 05/13/22  6:32 AM  Result Value Ref Range   Sodium 135 135 - 145 mmol/L   Potassium 4.3 3.5 - 5.1 mmol/L   Chloride 102 98 - 111 mmol/L   CO2 27  22 - 32 mmol/L   Glucose, Bld 266 (H) 70 - 99 mg/dL    Comment: Glucose reference range applies only to samples taken after fasting for at least 8 hours.   BUN 22 8 - 23 mg/dL   Creatinine, Ser 0.69 0.44 - 1.00 mg/dL   Calcium 9.0 8.9 - 10.3 mg/dL   GFR, Estimated >60 >60 mL/min    Comment: (NOTE) Calculated using the CKD-EPI Creatinine Equation (2021)    Anion gap 6 5 - 15    Comment: Performed at [facility], [address] [place], [place] [address]  HIV Antibody (routine testing w rflx)     Status: None   Collection Time: 05/13/22  6:32 AM  Result Value Ref Range   HIV Screen 4th Generation wRfx Non Reactive Non Reactive    Comment: Performed at [facility] [address], [place], [place] [address]  Glucose, capillary     Status: Abnormal   Collection Time: 05/13/22  8:06 AM  Result Value Ref Range   Glucose-Capillary 217 (H) 70 - 99 mg/dL    Comment: Glucose reference range applies only to samples taken after fasting for at least 8 hours.  MRSA Next Gen by PCR, Nasal     Status: None   Collection Time: 05/13/22  9:57 AM   Specimen: Nasal Mucosa; Nasal Swab  Result Value Ref Range   MRSA by PCR Next Gen NOT DETECTED NOT DETECTED    Comment: (NOTE) The GeneXpert MRSA Assay (FDA approved for NASAL specimens only), is one component of a comprehensive MRSA colonization surveillance program. It is not intended to diagnose MRSA infection nor to guide or monitor treatment for MRSA infections. Test performance is not FDA approved in patients less than 60 years old. Performed at [facility], [address] [place],  [address]   Glucose, capillary     Status: Abnormal   Collection Time: 05/13/22 11:35 AM  Result Value Ref Range   Glucose-Capillary 262 (H) 70 - 99 mg/dL    Comment: Glucose reference range applies only to samples taken after fasting for at least 8 hours.    Radiology: CT Angio Chest PE W and/or Wo Contrast  Result Date:  05/12/2022 CLINICAL DATA:  Short of breath, respiratory distress EXAM: CT ANGIOGRAPHY CHEST WITH CONTRAST TECHNIQUE: Multidetector CT imaging of the chest was performed using the standard protocol during bolus administration of intravenous contrast. Multiplanar CT image reconstructions and MIPs were obtained to evaluate  the vascular anatomy. RADIATION DOSE REDUCTION: This exam was performed according to the departmental dose-optimization program which includes automated exposure control, adjustment of the mA and/or kV according to patient size and/or use of iterative reconstruction technique. CONTRAST:  68m OMNIPAQUE IOHEXOL 350 MG/ML SOLN COMPARISON:  05/12/2022, 05/11/2022 FINDINGS: Cardiovascular: This is a technically adequate evaluation of the pulmonary vasculature. No filling defects or pulmonary emboli. The heart is unremarkable without pericardial effusion. No evidence of thoracic aortic aneurysm or dissection. Mediastinum/Nodes: No enlarged mediastinal, hilar, or axillary lymph nodes. Thyroid gland, trachea, and esophagus demonstrate no significant findings. Small hiatal hernia. Lungs/Pleura: Evaluation limited by respiratory motion. Faint ground-glass opacities are again seen within the upper lobes, with slight improvement since the previous exam. No new airspace disease, effusion, or pneumothorax. Central airways are patent. Upper Abdomen: No acute abnormality. Musculoskeletal: No acute or destructive bony lesions. Reconstructed images demonstrate no additional findings. Review of the MIP images confirms the above findings. IMPRESSION: 1. No evidence of pulmonary embolus. 2. Faint upper lobe ground-glass airspace disease consistent with residual infection. No significant change since the exam performed yesterday, but moderate improvement since the 04/24/2022 exam. 3. Hiatal hernia. Electronically Signed   By: MRanda NgoM.D.   On: 05/12/2022 21:31   DG Chest Portable 1 View  Result Date:  05/12/2022 CLINICAL DATA:  Short of breath, respiratory distress EXAM: PORTABLE CHEST 1 VIEW COMPARISON:  04/24/2022, 05/11/2022 FINDINGS: Single frontal view of the chest demonstrates a stable cardiac silhouette. There is increased interstitial prominence throughout the lungs, with patchy bilateral perihilar ground-glass airspace disease. No effusion or pneumothorax. Stable hiatal hernia. No acute bony abnormalities. IMPRESSION: 1. Interstitial and perihilar ground-glass opacities consistent with atypical viral pneumonia, including etiologies such as COVID-19. No significant change since CT performed yesterday. Electronically Signed   By: MRanda NgoM.D.   On: 05/12/2022 18:21    No results found.  CT Angio Chest PE W and/or Wo Contrast  Result Date: 05/12/2022 CLINICAL DATA:  Short of breath, respiratory distress EXAM: CT ANGIOGRAPHY CHEST WITH CONTRAST TECHNIQUE: Multidetector CT imaging of the chest was performed using the standard protocol during bolus administration of intravenous contrast. Multiplanar CT image reconstructions and MIPs were obtained to evaluate the vascular anatomy. RADIATION DOSE REDUCTION: This exam was performed according to the departmental dose-optimization program which includes automated exposure control, adjustment of the mA and/or kV according to patient size and/or use of iterative reconstruction technique. CONTRAST:  783mOMNIPAQUE IOHEXOL 350 MG/ML SOLN COMPARISON:  05/12/2022, 05/11/2022 FINDINGS: Cardiovascular: This is a technically adequate evaluation of the pulmonary vasculature. No filling defects or pulmonary emboli. The heart is unremarkable without pericardial effusion. No evidence of thoracic aortic aneurysm or dissection. Mediastinum/Nodes: No enlarged mediastinal, hilar, or axillary lymph nodes. Thyroid gland, trachea, and esophagus demonstrate no significant findings. Small hiatal hernia. Lungs/Pleura: Evaluation limited by respiratory motion. Faint  ground-glass opacities are again seen within the upper lobes, with slight improvement since the previous exam. No new airspace disease, effusion, or pneumothorax. Central airways are patent. Upper Abdomen: No acute abnormality. Musculoskeletal: No acute or destructive bony lesions. Reconstructed images demonstrate no additional findings. Review of the MIP images confirms the above findings. IMPRESSION: 1. No evidence of pulmonary embolus. 2. Faint upper lobe ground-glass airspace disease consistent with residual infection. No significant change since the exam performed yesterday, but moderate improvement since the 04/24/2022 exam. 3. Hiatal hernia. Electronically Signed   By: MiRanda Ngo.D.   On: 05/12/2022 21:31   DG Chest  Portable 1 View  Result Date: 05/12/2022 CLINICAL DATA:  Short of breath, respiratory distress EXAM: PORTABLE CHEST 1 VIEW COMPARISON:  04/24/2022, 05/11/2022 FINDINGS: Single frontal view of the chest demonstrates a stable cardiac silhouette. There is increased interstitial prominence throughout the lungs, with patchy bilateral perihilar ground-glass airspace disease. No effusion or pneumothorax. Stable hiatal hernia. No acute bony abnormalities. IMPRESSION: 1. Interstitial and perihilar ground-glass opacities consistent with atypical viral pneumonia, including etiologies such as COVID-19. No significant change since CT performed yesterday. Electronically Signed   By: Randa Ngo M.D.   On: 05/12/2022 18:21   CT Chest Wo Contrast  Result Date: 05/11/2022 CLINICAL DATA:  67 year old female with abnormal chest CTA on 04/24/2022 suspicious for multifocal pneumonia. Finished antibiotics. Respiratory illness. Worsening shortness of breath and chest tightness over the past 3 days. History of bilateral breast reduction. EXAM: CT CHEST WITHOUT CONTRAST TECHNIQUE: Multidetector CT imaging of the chest was performed following the standard protocol without IV contrast. RADIATION DOSE  REDUCTION: This exam was performed according to the departmental dose-optimization program which includes automated exposure control, adjustment of the mA and/or kV according to patient size and/or use of iterative reconstruction technique. COMPARISON:  Chest CTA 04/24/2022. FINDINGS: Cardiovascular: No cardiomegaly or pericardial effusion. No significant thoracic aortic calcified atherosclerosis. Vascular patency is not evaluated in the absence of IV contrast. Mediastinum/Nodes: Postoperative changes surrounding moderate sized gastric hiatal hernia this is stable from earlier this month. No superimposed mediastinal mass or lymphadenopathy. Lungs/Pleura: Mild respiratory motion today slightly larger lung volumes. Major airways are patent. There is up to mild bilateral perihilar bronchial wall thickening. Right upper lobe, middle lobe and lower lobe regressed peribronchial opacity, although not fully resolved. Residual peribronchial sub solid ground-glass opacity now in the previously affected areas including a 2 cm area of the peripheral right upper lobe on series 3 image 26. See also image 33 in the upper lobe, image 48 near the hilum, image 71 in the middle lobe. No right pleural effusion. Contralateral much less pronounced lower lobe peribronchial opacity has also regressed with occasional small areas of ground-glass residual (series 3, image 68 in the left lower lobe). However, a vague 3 cm area of peripheral left upper lobe ground-glass opacity has increased since the prior on image 37. No left pleural effusion Upper Abdomen: Numerous additional surgical clips along the esophageal hiatus and stomach, gastrosplenic ligament. Partially visible cholecystectomy clips also. Negative visible noncontrast liver, spleen, adrenal glands, large and small bowel loops. Fatty atrophy pancreas peer right upper pole nephrolithiasis measuring 4 mm. No free air or free fluid in the visible upper abdomen. Musculoskeletal:  Advanced thoracic disc and endplate degeneration with partially visible T12-L1 interbody ankylosis. No acute or suspicious osseous lesion identified. Stable postoperative changes to the breast soft tissues with areas of coarse, likely postoperative dystrophic calcification (on the right series 2, image 65). IMPRESSION: 1. Regressed bilateral multilobar Bronchopneumonia since 04/24/2022. Residual scattered ground-glass opacity, and a vague new 3 cm area of peripheral left upper lobe ground-glass opacity. No pleural effusion. Per Fleischner Society Guidelines, recommend a non-contrast Chest CT at 6 months to confirm persistence, then additional non-contrast Chest CTs every 2 years until 5 years. If nodule grows or develops solid component(s), consider resection. These guidelines do not apply to immunocompromised patients and patients with cancer. Follow up in patients with significant comorbidities as clinically warranted. For lung cancer screening, adhere to Lung-RADS guidelines. Reference: Radiology. 2017; 284(1):228-43. 2. Chronic postoperative changes associated with moderate gastric hiatal hernia and  intra-abdominal stomach. Cholecystectomy. Right nephrolithiasis. Electronically Signed   By: Genevie Ann M.D.   On: 05/11/2022 10:49      Assessment and Plan: Patient Active Problem List   Diagnosis Date Noted   Dyspnea 05/13/2022   Multifocal pneumonia 05/12/2022   Hypertensive urgency 05/12/2022   Sepsis ([place]) 05/12/2022   Hyperglycemia 05/12/2022   CAP (community acquired pneumonia) 04/24/2022   PNA (pneumonia) 04/24/2022   Hypocalcemia 07/31/2021   Esophageal dysphagia    Benign esophageal stricture    S/P hardware removal 11/14/2020   IDA (iron deficiency anemia) 10/18/2020   Moderate persistent asthma with acute exacerbation 08/21/2020   S/P revision of total knee, right 11/14/2019   Seasonal allergic rhinitis due to pollen 09/20/2019   Callus of foot 08/13/2019   Candidal vaginitis  08/13/2019   Other symptoms and signs involving the nervous system 08/13/2019   Encounter for general adult medical examination with abnormal findings 08/13/2019   Restless leg syndrome 04/30/2019   Muscle cramps 0000000   Diastolic dysfunction [id number]   SOB (shortness of breath) 12/14/2018   Hiatal hernia with GERD 12/14/2018   Wheezing 12/14/2018   Impaired fasting glucose 12/14/2018   COPD with acute exacerbation ([place]) 09/28/2018   Acute upper respiratory infection 08/25/2018   Recurrent sinusitis 08/15/2018   Prediabetes 08/15/2018   Nonintractable headache 08/15/2018   Dysuria 08/15/2018   Intractable migraine without status migrainosus 04/22/2018   Nausea 04/22/2018   Polyneuropathy associated with underlying disease ([place]) 04/22/2018   Midline low back pain without sciatica 04/22/2018   Routine cervical smear 04/22/2018   HTN (hypertension) 07/01/2017   Acute bronchitis with asthma 07/01/2017   Allergic rhinitis 07/01/2017   Moderate asthma without complication [id number]   Severe recurrent major depression without psychotic features ([place]) 09/10/2014   COPD (chronic obstructive pulmonary disease) ([place]) 09/10/2014   Calculi, ureter 04/26/2013   Corneal graft malfunction 08/17/2012   Calculus of kidney [id number]   Renal colic [id number]   Urge incontinence 04/26/2012   Diaphragmatic hernia 10/27/2010   Barrett esophagus 07/07/2010    1. Moderate persistent asthma without complication She is not on her breo and needs to go back to the breo. Use rescue as needed  2. Polyneuropathy associated with underlying disease ([place]) Stable supportive care  3. Hiatal hernia with GERD Awaiting surgical input for repair   General Counseling: I have discussed the findings of the evaluation and examination with Marguerite.  I have also discussed any further diagnostic evaluation thatmay be needed or ordered today. Marialy verbalizes understanding of the findings of todays visit. We  also reviewed her medications today and discussed drug interactions and side effects including but not limited excessive drowsiness and altered mental states. We also discussed that there is always a risk not just to her but also people around her. she has been encouraged to call the office with any questions or concerns that should arise related to todays visit.  Orders Placed This Encounter  Procedures   DG Chest 2 View    Standing Status:   Future    Standing Expiration Date:   05/25/2023    Order Specific Question:   Reason for Exam (SYMPTOM  OR DIAGNOSIS REQUIRED)    Answer:   pneumonia follow up    Order Specific Question:   Preferred imaging location?    Answer:   [facility]     Time spent: 22  I have personally obtained a history, examined the patient, evaluated laboratory and imaging results, formulated the  assessment and plan and placed orders.    Allyne Gee, MD [facility] Pulmonary and Critical Care Sleep medicine

## 2022-05-29 ENCOUNTER — Telehealth: Payer: Self-pay | Admitting: Nurse Practitioner

## 2022-05-29 NOTE — Telephone Encounter (Signed)
Patient called regarding GI referral. I gave her telephone # for [facility]

## 2022-06-01 ENCOUNTER — Ambulatory Visit: Payer: Self-pay | Admitting: *Deleted

## 2022-06-01 ENCOUNTER — Emergency Department: Payer: 59

## 2022-06-01 ENCOUNTER — Other Ambulatory Visit: Payer: Self-pay

## 2022-06-01 ENCOUNTER — Inpatient Hospital Stay
Admission: EM | Admit: 2022-06-01 | Discharge: 2022-06-03 | DRG: 177 | Disposition: A | Discharge status: Home / Self Care | Payer: 59 | Attending: Internal Medicine | Admitting: Internal Medicine

## 2022-06-01 DIAGNOSIS — K449 Diaphragmatic hernia without obstruction or gangrene: Secondary | ICD-10-CM | POA: Diagnosis present

## 2022-06-01 DIAGNOSIS — J4521 Mild intermittent asthma with (acute) exacerbation: Secondary | ICD-10-CM | POA: Diagnosis present

## 2022-06-01 DIAGNOSIS — Z947 Corneal transplant status: Secondary | ICD-10-CM

## 2022-06-01 DIAGNOSIS — E1165 Type 2 diabetes mellitus with hyperglycemia: Secondary | ICD-10-CM | POA: Diagnosis not present

## 2022-06-01 DIAGNOSIS — Z8042 Family history of malignant neoplasm of prostate: Secondary | ICD-10-CM

## 2022-06-01 DIAGNOSIS — Z823 Family history of stroke: Secondary | ICD-10-CM | POA: Diagnosis not present

## 2022-06-01 DIAGNOSIS — Z87442 Personal history of urinary calculi: Secondary | ICD-10-CM | POA: Diagnosis not present

## 2022-06-01 DIAGNOSIS — I493 Ventricular premature depolarization: Secondary | ICD-10-CM | POA: Diagnosis present

## 2022-06-01 DIAGNOSIS — F419 Anxiety disorder, unspecified: Secondary | ICD-10-CM | POA: Diagnosis not present

## 2022-06-01 DIAGNOSIS — J45901 Unspecified asthma with (acute) exacerbation: Principal | ICD-10-CM | POA: Diagnosis present

## 2022-06-01 DIAGNOSIS — Z886 Allergy status to analgesic agent status: Secondary | ICD-10-CM | POA: Diagnosis not present

## 2022-06-01 DIAGNOSIS — Z8249 Family history of ischemic heart disease and other diseases of the circulatory system: Secondary | ICD-10-CM

## 2022-06-01 DIAGNOSIS — J9621 Acute and chronic respiratory failure with hypoxia: Principal | ICD-10-CM | POA: Diagnosis present

## 2022-06-01 DIAGNOSIS — E871 Hypo-osmolality and hyponatremia: Secondary | ICD-10-CM | POA: Diagnosis present

## 2022-06-01 DIAGNOSIS — R739 Hyperglycemia, unspecified: Secondary | ICD-10-CM | POA: Diagnosis present

## 2022-06-01 DIAGNOSIS — T380X5A Adverse effect of glucocorticoids and synthetic analogues, initial encounter: Secondary | ICD-10-CM | POA: Diagnosis present

## 2022-06-01 DIAGNOSIS — Z833 Family history of diabetes mellitus: Secondary | ICD-10-CM | POA: Diagnosis not present

## 2022-06-01 DIAGNOSIS — I10 Essential (primary) hypertension: Secondary | ICD-10-CM | POA: Diagnosis not present

## 2022-06-01 DIAGNOSIS — Z1152 Encounter for screening for COVID-19: Secondary | ICD-10-CM

## 2022-06-01 DIAGNOSIS — Z89422 Acquired absence of other left toe(s): Secondary | ICD-10-CM

## 2022-06-01 DIAGNOSIS — Z888 Allergy status to other drugs, medicaments and biological substances status: Secondary | ICD-10-CM | POA: Diagnosis not present

## 2022-06-01 DIAGNOSIS — Z9109 Other allergy status, other than to drugs and biological substances: Secondary | ICD-10-CM

## 2022-06-01 DIAGNOSIS — Z882 Allergy status to sulfonamides status: Secondary | ICD-10-CM

## 2022-06-01 DIAGNOSIS — K219 Gastro-esophageal reflux disease without esophagitis: Secondary | ICD-10-CM | POA: Diagnosis present

## 2022-06-01 DIAGNOSIS — Z803 Family history of malignant neoplasm of breast: Secondary | ICD-10-CM

## 2022-06-01 DIAGNOSIS — J9601 Acute respiratory failure with hypoxia: Secondary | ICD-10-CM | POA: Diagnosis not present

## 2022-06-01 DIAGNOSIS — Z79899 Other long term (current) drug therapy: Secondary | ICD-10-CM

## 2022-06-01 DIAGNOSIS — Z881 Allergy status to other antibiotic agents status: Secondary | ICD-10-CM

## 2022-06-01 DIAGNOSIS — F32A Depression, unspecified: Secondary | ICD-10-CM | POA: Diagnosis present

## 2022-06-01 DIAGNOSIS — J690 Pneumonitis due to inhalation of food and vomit: Principal | ICD-10-CM | POA: Diagnosis present

## 2022-06-01 DIAGNOSIS — J45909 Unspecified asthma, uncomplicated: Secondary | ICD-10-CM | POA: Diagnosis present

## 2022-06-01 DIAGNOSIS — R000 Tachycardia, unspecified: Secondary | ICD-10-CM | POA: Diagnosis present

## 2022-06-01 DIAGNOSIS — G43909 Migraine, unspecified, not intractable, without status migrainosus: Secondary | ICD-10-CM | POA: Diagnosis present

## 2022-06-01 DIAGNOSIS — Z7951 Long term (current) use of inhaled steroids: Secondary | ICD-10-CM

## 2022-06-01 DIAGNOSIS — G2581 Restless legs syndrome: Secondary | ICD-10-CM | POA: Diagnosis not present

## 2022-06-01 LAB — CBC WITH DIFFERENTIAL/PLATELET
Abs Immature Granulocytes: 0.04 10*3/uL (ref 0.00–0.07)
Basophils Absolute: 0 10*3/uL (ref 0.0–0.1)
Basophils Relative: 0 %
Eosinophils Absolute: 0.1 10*3/uL (ref 0.0–0.5)
Eosinophils Relative: 1 %
HCT: 37.3 % (ref 36.0–46.0)
Hemoglobin: 12 g/dL (ref 12.0–15.0)
Immature Granulocytes: 0 %
Lymphocytes Relative: 20 %
Lymphs Abs: 2 10*3/uL (ref 0.7–4.0)
MCH: 29.1 pg (ref 26.0–34.0)
MCHC: 32.2 g/dL (ref 30.0–36.0)
MCV: 90.3 fL (ref 80.0–100.0)
Monocytes Absolute: 0.6 10*3/uL (ref 0.1–1.0)
Monocytes Relative: 6 %
Neutro Abs: 7.2 10*3/uL (ref 1.7–7.7)
Neutrophils Relative %: 73 %
Platelets: 253 10*3/uL (ref 150–400)
RBC: 4.13 MIL/uL (ref 3.87–5.11)
RDW: 14 % (ref 11.5–15.5)
WBC: 9.9 10*3/uL (ref 4.0–10.5)
nRBC: 0 % (ref 0.0–0.2)

## 2022-06-01 LAB — BLOOD GAS, VENOUS
Acid-Base Excess: 5.9 mmol/L — ABNORMAL HIGH (ref 0.0–2.0)
Bicarbonate: 27.5 mmol/L (ref 20.0–28.0)
O2 Saturation: 97.3 %
Patient temperature: 37
pCO2, Ven: 30 mmHg — ABNORMAL LOW (ref 44–60)
pH, Ven: 7.57 — ABNORMAL HIGH (ref 7.25–7.43)
pO2, Ven: 121 mmHg — ABNORMAL HIGH (ref 32–45)

## 2022-06-01 LAB — COMPREHENSIVE METABOLIC PANEL
ALT: 28 U/L (ref 0–44)
AST: 28 U/L (ref 15–41)
Albumin: 3.6 g/dL (ref 3.5–5.0)
Alkaline Phosphatase: 74 U/L (ref 38–126)
Anion gap: 10 (ref 5–15)
BUN: 18 mg/dL (ref 8–23)
CO2: 24 mmol/L (ref 22–32)
Calcium: 9 mg/dL (ref 8.9–10.3)
Chloride: 100 mmol/L (ref 98–111)
Creatinine, Ser: 0.73 mg/dL (ref 0.44–1.00)
GFR, Estimated: >60 mL/min (ref >60)
Glucose, Bld: 170 mg/dL — ABNORMAL HIGH (ref 70–99)
Potassium: 4.7 mmol/L (ref 3.5–5.1)
Sodium: 134 mmol/L — ABNORMAL LOW (ref 135–145)
Total Bilirubin: 0.6 mg/dL (ref 0.3–1.2)
Total Protein: 6.6 g/dL (ref 6.5–8.1)

## 2022-06-01 LAB — PROCALCITONIN: Procalcitonin: 0.44 ng/mL

## 2022-06-01 LAB — RESP PANEL BY RT-PCR (RSV, FLU A&B, COVID)  RVPGX2
Influenza A by PCR: NEGATIVE
Influenza B by PCR: NEGATIVE
Resp Syncytial Virus by PCR: NEGATIVE
SARS Coronavirus 2 by RT PCR: NEGATIVE

## 2022-06-01 LAB — LACTIC ACID, PLASMA
Lactic Acid, Venous: 2.5 mmol/L (ref 0.5–1.9)
Lactic Acid, Venous: 1.2 mmol/L (ref 0.5–1.9)

## 2022-06-01 LAB — BRAIN NATRIURETIC PEPTIDE: B Natriuretic Peptide: 22.8 pg/mL (ref 0.0–100.0)

## 2022-06-01 MED ORDER — PREDNISONE 20 MG PO TABS
40.0000 mg | ORAL_TABLET | Freq: Every day | ORAL | Status: DC
  Administered 2022-06-03: 40 mg via ORAL
  Filled 2022-06-01: qty 2

## 2022-06-01 MED ORDER — ENOXAPARIN SODIUM 40 MG/0.4ML IJ SOSY
40.0000 mg | PREFILLED_SYRINGE | INTRAMUSCULAR | Status: DC
  Administered 2022-06-01 – 2022-06-02 (×2): 40 mg via SUBCUTANEOUS
  Filled 2022-06-01 (×2): qty 0.4

## 2022-06-01 MED ORDER — SODIUM CHLORIDE 0.9 % IV SOLN
2.0000 g | Freq: Once | INTRAVENOUS | Status: DC
  Filled 2022-06-01: qty 20

## 2022-06-01 MED ORDER — BENZONATATE 100 MG PO CAPS: 100.0000 mg | ORAL_CAPSULE | Freq: Two times a day (BID) | ORAL | Status: DC | PRN

## 2022-06-01 MED ORDER — HYDROXYZINE HCL 25 MG PO TABS: 25.0000 mg | ORAL_TABLET | Freq: Four times a day (QID) | ORAL | Status: DC | PRN

## 2022-06-01 MED ORDER — SODIUM CHLORIDE 0.9 % IV SOLN
3.0000 g | Freq: Four times a day (QID) | INTRAVENOUS | Status: DC
  Administered 2022-06-01 – 2022-06-03 (×7): 3 g via INTRAVENOUS
  Filled 2022-06-01 (×4): qty 8
  Filled 2022-06-01: qty 3
  Filled 2022-06-01 (×3): qty 8

## 2022-06-01 MED ORDER — ROPINIROLE HCL 1 MG PO TABS
0.5000 mg | ORAL_TABLET | Freq: Two times a day (BID) | ORAL | Status: DC
  Administered 2022-06-01 – 2022-06-02 (×2): 0.5 mg via ORAL
  Filled 2022-06-01: qty 1
  Filled 2022-06-01: qty 2

## 2022-06-01 MED ORDER — GABAPENTIN 300 MG PO CAPS
600.0000 mg | ORAL_CAPSULE | Freq: Two times a day (BID) | ORAL | Status: DC
  Administered 2022-06-01 – 2022-06-03 (×4): 600 mg via ORAL
  Filled 2022-06-01 (×4): qty 2

## 2022-06-01 MED ORDER — DICLOFENAC SODIUM 1 % EX GEL
4.0000 g | Freq: Four times a day (QID) | CUTANEOUS | Status: DC
  Administered 2022-06-02 (×2): 4 g via TOPICAL
  Filled 2022-06-01 (×2): qty 100

## 2022-06-01 MED ORDER — MAGNESIUM HYDROXIDE 400 MG/5ML PO SUSP: 30.0000 mL | Freq: Every day | ORAL | Status: DC | PRN

## 2022-06-01 MED ORDER — PILOCARPINE HCL 5 MG PO TABS
5.0000 mg | ORAL_TABLET | Freq: Two times a day (BID) | ORAL | Status: DC
  Administered 2022-06-02 – 2022-06-03 (×3): 5 mg via ORAL
  Filled 2022-06-01 (×4): qty 1

## 2022-06-01 MED ORDER — SODIUM CHLORIDE 0.9 % IV BOLUS
500.0000 mL | Freq: Once | INTRAVENOUS | Status: AC
  Administered 2022-06-01: 500 mL via INTRAVENOUS

## 2022-06-01 MED ORDER — CLONAZEPAM 0.5 MG PO TABS
0.5000 mg | ORAL_TABLET | Freq: Two times a day (BID) | ORAL | Status: DC | PRN
  Administered 2022-06-02 – 2022-06-03 (×2): 0.5 mg via ORAL
  Filled 2022-06-01 (×2): qty 1

## 2022-06-01 MED ORDER — IPRATROPIUM-ALBUTEROL 0.5-2.5 (3) MG/3ML IN SOLN
3.0000 mL | Freq: Once | RESPIRATORY_TRACT | Status: AC
  Administered 2022-06-01: 3 mL via RESPIRATORY_TRACT
  Filled 2022-06-01: qty 3

## 2022-06-01 MED ORDER — METHYLPREDNISOLONE SODIUM SUCC 125 MG IJ SOLR
125.0000 mg | Freq: Once | INTRAMUSCULAR | Status: AC
  Administered 2022-06-01: 125 mg via INTRAVENOUS
  Filled 2022-06-01: qty 2

## 2022-06-01 MED ORDER — TRAZODONE HCL 50 MG PO TABS: 25.0000 mg | ORAL_TABLET | Freq: Every evening | ORAL | Status: DC | PRN

## 2022-06-01 MED ORDER — IPRATROPIUM-ALBUTEROL 0.5-2.5 (3) MG/3ML IN SOLN
3.0000 mL | Freq: Four times a day (QID) | RESPIRATORY_TRACT | Status: DC
  Administered 2022-06-02 – 2022-06-03 (×5): 3 mL via RESPIRATORY_TRACT
  Filled 2022-06-01 (×5): qty 3

## 2022-06-01 MED ORDER — BUSPIRONE HCL 10 MG PO TABS
10.0000 mg | ORAL_TABLET | Freq: Two times a day (BID) | ORAL | Status: DC
  Administered 2022-06-01 – 2022-06-03 (×4): 10 mg via ORAL
  Filled 2022-06-01: qty 2
  Filled 2022-06-01 (×3): qty 1

## 2022-06-01 MED ORDER — ONDANSETRON HCL 4 MG/2ML IJ SOLN: 4.0000 mg | Freq: Four times a day (QID) | INTRAMUSCULAR | Status: DC | PRN

## 2022-06-01 MED ORDER — MONTELUKAST SODIUM 10 MG PO TABS
10.0000 mg | ORAL_TABLET | Freq: Every day | ORAL | Status: DC
  Administered 2022-06-01 – 2022-06-02 (×2): 10 mg via ORAL
  Filled 2022-06-01 (×2): qty 1

## 2022-06-01 MED ORDER — SODIUM CHLORIDE 0.9 % IV SOLN: 1.0000 g | INTRAVENOUS | Status: DC

## 2022-06-01 MED ORDER — ACETAMINOPHEN 650 MG RE SUPP: 650.0000 mg | Freq: Four times a day (QID) | RECTAL | Status: DC | PRN

## 2022-06-01 MED ORDER — TRAZODONE HCL 50 MG PO TABS
150.0000 mg | ORAL_TABLET | Freq: Every day | ORAL | Status: DC
  Administered 2022-06-01 – 2022-06-02 (×2): 150 mg via ORAL
  Filled 2022-06-01 (×2): qty 1

## 2022-06-01 MED ORDER — ACETAMINOPHEN 325 MG PO TABS
650.0000 mg | ORAL_TABLET | Freq: Four times a day (QID) | ORAL | Status: DC | PRN
  Administered 2022-06-01 – 2022-06-02 (×2): 650 mg via ORAL
  Filled 2022-06-01 (×2): qty 2

## 2022-06-01 MED ORDER — SODIUM CHLORIDE 0.9 % IV SOLN: INTRAVENOUS | Status: DC

## 2022-06-01 MED ORDER — HYDROCOD POLI-CHLORPHE POLI ER 10-8 MG/5ML PO SUER: 5.0000 mL | Freq: Two times a day (BID) | ORAL | Status: DC | PRN

## 2022-06-01 MED ORDER — EPINEPHRINE 0.3 MG/0.3ML IJ SOAJ: 0.3000 mg | INTRAMUSCULAR | Status: DC | PRN

## 2022-06-01 MED ORDER — SODIUM CHLORIDE 0.9 % IV SOLN
100.0000 mg | Freq: Once | INTRAVENOUS | Status: DC
  Filled 2022-06-01: qty 100

## 2022-06-01 MED ORDER — METHYLPREDNISOLONE SODIUM SUCC 40 MG IJ SOLR
40.0000 mg | Freq: Two times a day (BID) | INTRAMUSCULAR | Status: AC
  Administered 2022-06-01 – 2022-06-02 (×2): 40 mg via INTRAVENOUS
  Filled 2022-06-01 (×2): qty 1

## 2022-06-01 MED ORDER — FLUTICASONE PROPIONATE 50 MCG/ACT NA SUSP: 2.0000 | Freq: Every day | NASAL | Status: DC | PRN

## 2022-06-01 MED ORDER — SUMATRIPTAN SUCCINATE 50 MG PO TABS
100.0000 mg | ORAL_TABLET | ORAL | Status: DC | PRN
  Administered 2022-06-02: 100 mg via ORAL
  Filled 2022-06-01: qty 2

## 2022-06-01 MED ORDER — GUAIFENESIN ER 600 MG PO TB12
600.0000 mg | ORAL_TABLET | Freq: Two times a day (BID) | ORAL | Status: DC
  Administered 2022-06-01 – 2022-06-03 (×4): 600 mg via ORAL
  Filled 2022-06-01 (×4): qty 1

## 2022-06-01 MED ORDER — SENNOSIDES-DOCUSATE SODIUM 8.6-50 MG PO TABS: 2.0000 | ORAL_TABLET | Freq: Every day | ORAL | Status: DC | PRN

## 2022-06-01 MED ORDER — LORATADINE 10 MG PO TABS: 10.0000 mg | ORAL_TABLET | Freq: Every day | ORAL | Status: DC | PRN

## 2022-06-01 MED ORDER — ONDANSETRON HCL 4 MG PO TABS: 4.0000 mg | ORAL_TABLET | Freq: Four times a day (QID) | ORAL | Status: DC | PRN

## 2022-06-01 MED ORDER — MAGNESIUM SULFATE 2 GM/50ML IV SOLN
2.0000 g | Freq: Once | INTRAVENOUS | Status: AC
  Administered 2022-06-01: 2 g via INTRAVENOUS
  Filled 2022-06-01: qty 50

## 2022-06-01 MED ORDER — DILTIAZEM HCL ER COATED BEADS 120 MG PO CP24
240.0000 mg | ORAL_CAPSULE | Freq: Every day | ORAL | Status: DC
  Administered 2022-06-02 – 2022-06-03 (×2): 240 mg via ORAL
  Filled 2022-06-01 (×2): qty 2

## 2022-06-01 MED ORDER — PANTOPRAZOLE SODIUM 40 MG PO TBEC
40.0000 mg | DELAYED_RELEASE_TABLET | Freq: Two times a day (BID) | ORAL | Status: DC
  Administered 2022-06-01 – 2022-06-03 (×4): 40 mg via ORAL
  Filled 2022-06-01 (×4): qty 1

## 2022-06-01 MED ORDER — DULOXETINE HCL 30 MG PO CPEP
60.0000 mg | ORAL_CAPSULE | Freq: Every day | ORAL | Status: DC
  Administered 2022-06-01 – 2022-06-02 (×2): 60 mg via ORAL
  Filled 2022-06-01: qty 2
  Filled 2022-06-01: qty 1

## 2022-06-01 NOTE — ED Provider Notes (Signed)
[facility] Provider Note    Event Date/Time   First MD Initiated Contact with Patient 06/01/22 1748     (approximate)   History   Shortness of Breath   HPI  Summer Murphy is a 67 y.o. female  here with sob. History limited  2/2 incresead wob. However, pt has a h/o asthma, anxiety, depression, recurrent admissions for asthma and states that over the past 2-3 days she has had recurrence of increased cough, some sputum production, and fatigue. She has been using her inhalers almost constantly without significant relief. No fevers, chills. No leg swelling. She feels like her hiatal hernia is putting pressure on her lungs and causing her sx to get worse. No other complaints.      Physical Exam   Triage Vital Signs: ED Triage Vitals  Enc Vitals Group     BP 06/01/22 1741 (!) 162/144     Pulse Rate 06/01/22 1741 (!) 102     Resp 06/01/22 1744 (!) 25     Temp 06/01/22 1745 98.2 F (36.8 C)     Temp src --      SpO2 06/01/22 1745 99 %     Weight 06/01/22 1740 165 lb 5.5 oz (75 kg)     Height 06/01/22 1740 '5\' 3"'$  (1.6 m)     Head Circumference --      Peak Flow --      Pain Score 06/01/22 1740 0     Pain Loc --      Pain Edu? --      Excl. in [place]? --     Most recent vital signs: Vitals:   06/01/22 2247 06/02/22 0035  BP:    Pulse: 96 95  Resp: (!) 21 16  Temp:    SpO2: 97%      General: Awake, in mild respiratory distress. CV:  Good peripheral perfusion. RRR. No murmurs, rubs, or gallops. Resp:  Significant increased WOB with diffuse wheezes and rales. Abd:  No distention. No tenderness. No rebound or guarding. Other:  No LE edema.   ED Results / Procedures / Treatments   Labs (all labs ordered are listed, but only abnormal results are displayed) Labs Reviewed  COMPREHENSIVE METABOLIC PANEL - Abnormal; Notable for the following components:      Result Value   Sodium 134 (*)    Glucose, Bld 170 (*)    All other components within  normal limits  LACTIC ACID, PLASMA - Abnormal; Notable for the following components:   Lactic Acid, Venous 2.5 (*)    All other components within normal limits  BLOOD GAS, VENOUS - Abnormal; Notable for the following components:   pH, Ven 7.57 (*)    pCO2, Ven 30 (*)    pO2, Ven 121 (*)    Acid-Base Excess 5.9 (*)    All other components within normal limits  RESP PANEL BY RT-PCR (RSV, FLU A&B, COVID)  RVPGX2  CULTURE, BLOOD (SINGLE)  CBC WITH DIFFERENTIAL/PLATELET  BRAIN NATRIURETIC PEPTIDE  LACTIC ACID, PLASMA  PROCALCITONIN  BASIC METABOLIC PANEL  CBC     EKG Sinus tachycardia, VR 103. PR 139, QRS 131, QTc 506. No acute st elevations or depressions. No ischemia or infarct   RADIOLOGY CXR: Clear   I also independently reviewed and agree with radiologist interpretations.   PROCEDURES:  Critical Care performed: Yes, see critical care procedure note(s)  .Critical Care  Performed by: Duffy Bruce, MD Authorized by: Duffy Bruce, MD  Critical care provider statement:    Critical care time (minutes):  30   Critical care time was exclusive of:  Separately billable procedures and treating other patients   Critical care was necessary to treat or prevent imminent or life-threatening deterioration of the following conditions:  Cardiac failure, circulatory failure and respiratory failure   Critical care was time spent personally by me on the following activities:  Development of treatment plan with patient or surrogate, discussions with consultants, evaluation of patient's response to treatment, examination of patient, ordering and review of laboratory studies, ordering and review of radiographic studies, ordering and performing treatments and interventions, pulse oximetry, re-evaluation of patient's condition and review of old [place] [facility]: Medications  SUMAtriptan (IMITREX) tablet 100 mg (has no administration in time range)  diltiazem  (CARDIZEM CD) 24 hr capsule 240 mg (has no administration in time range)  EPINEPHrine (EPI-PEN) injection 0.3 mg (has no administration in time range)  busPIRone (BUSPAR) tablet 10 mg (10 mg Oral Given 06/01/22 2303)  DULoxetine (CYMBALTA) DR capsule 60 mg (60 mg Oral Given 06/01/22 2302)  hydrOXYzine (ATARAX) tablet 25-50 mg (has no administration in time range)  traZODone (DESYREL) tablet 150 mg (150 mg Oral Given 06/01/22 2303)  pantoprazole (PROTONIX) EC tablet 40 mg (40 mg Oral Given 06/01/22 2304)  senna-docusate (Senokot-S) tablet 2 tablet (has no administration in time range)  clonazePAM (KLONOPIN) tablet 0.5 mg (has no administration in time range)  rOPINIRole (REQUIP) tablet 0.5 mg (0.5 mg Oral Given 06/01/22 2348)  gabapentin (NEURONTIN) capsule 600 mg (600 mg Oral Given 06/01/22 2347)  benzonatate (TESSALON) capsule 100 mg (has no administration in time range)  fluticasone (FLONASE) 50 MCG/ACT nasal spray 2 spray (has no administration in time range)  loratadine (CLARITIN) tablet 10 mg (has no administration in time range)  montelukast (SINGULAIR) tablet 10 mg (10 mg Oral Given 06/01/22 2304)  diclofenac Sodium (VOLTAREN) 1 % topical gel 4 g (4 g Topical Not Given 06/01/22 2349)  pilocarpine (SALAGEN) tablet 5 mg (5 mg Oral Not Given 06/01/22 2346)  methylPREDNISolone sodium succinate (SOLU-MEDROL) 40 mg/mL injection 40 mg (40 mg Intravenous Given 06/01/22 2302)    Followed by  predniSONE (DELTASONE) tablet 40 mg (has no administration in time range)  enoxaparin (LOVENOX) injection 40 mg (40 mg Subcutaneous Given 06/01/22 2304)  0.9 %  sodium chloride infusion (has no administration in time range)  acetaminophen (TYLENOL) tablet 650 mg (650 mg Oral Given 06/01/22 2347)    Or  acetaminophen (TYLENOL) suppository 650 mg ( Rectal See Alternative 06/01/22 2347)  traZODone (DESYREL) tablet 25 mg (has no administration in time range)  magnesium hydroxide (MILK OF MAGNESIA) suspension 30 mL (has no  administration in time range)  ondansetron (ZOFRAN) tablet 4 mg (has no administration in time range)    Or  ondansetron (ZOFRAN) injection 4 mg (has no administration in time range)  guaiFENesin (MUCINEX) 12 hr tablet 600 mg (600 mg Oral Given 06/01/22 2304)  chlorpheniramine-HYDROcodone (TUSSIONEX) 10-8 MG/5ML suspension 5 mL (has no administration in time range)  ipratropium-albuterol (DUONEB) 0.5-2.5 (3) MG/3ML nebulizer solution 3 mL (has no administration in time range)  Ampicillin-Sulbactam (UNASYN) 3 g in sodium chloride 0.9 % 100 mL IVPB (0 g Intravenous Stopped 06/02/22 0053)  ipratropium-albuterol (DUONEB) 0.5-2.5 (3) MG/3ML nebulizer solution 3 mL (3 mLs Nebulization Given 06/01/22 1802)  ipratropium-albuterol (DUONEB) 0.5-2.5 (3) MG/3ML nebulizer solution 3 mL (3 mLs Nebulization Given 06/01/22 1801)  ipratropium-albuterol (DUONEB) 0.5-2.5 (  3) MG/3ML nebulizer solution 3 mL (3 mLs Nebulization Given 06/01/22 1801)  methylPREDNISolone sodium succinate (SOLU-MEDROL) 125 mg/2 mL injection 125 mg (125 mg Intravenous Given 06/01/22 1806)  magnesium sulfate IVPB 2 g 50 mL (0 g Intravenous Stopped 06/01/22 1925)  sodium chloride 0.9 % bolus 500 mL (0 mLs Intravenous Stopped 06/02/22 0053)     IMPRESSION / MDM / ASSESSMENT AND PLAN / ED COURSE  I reviewed the triage vital signs and the nursing notes.                              Differential diagnosis includes, but is not limited to, asthma exacerbation, COPD exacerbation, CAP, CHF, ACS, arrhythmia, anemia  Patient's presentation is most consistent with acute presentation with potential threat to life or bodily function.   The patient is on the cardiac monitor to evaluate for evidence of arrhythmia and/or significant heart rate changes  67 yo F with PMHx asthma, recurrent admissions for exacerbation in February, here with acute SOB. Suspect recurrent asthma/bronchospasm, possible exacerbated by episodes of aspiration. CXR is clear. CBC  without leukocytosis. LA 2.5 which I suspect is from increased WOB - no signs of shock. Blood gas without retention. Procal is >0.25. Will start empiiric CAP/LRTI coverage and admit. Pt had significant increased WOB on arrival - improved with BIPAP, IV steroids, IV mag and duonebs x 3.    FINAL CLINICAL IMPRESSION(S) / ED DIAGNOSES   Final diagnoses:  Acute on chronic respiratory failure with hypoxia (HCC)  Mild intermittent asthma with exacerbation     Rx / DC Orders   ED Discharge Orders     None        Note:  This document was prepared using Dragon voice recognition software and may include unintentional dictation errors.   Duffy Bruce, MD 06/02/22 0130

## 2022-06-01 NOTE — ED Notes (Signed)
Pt resting in bed, denies needs at this time. Pt A&Ox4. VSS at this time.

## 2022-06-01 NOTE — Telephone Encounter (Signed)
  Chief Complaint: difficulty breathing Symptoms: shortness of breath constant despite nebulizer treatments taken more than prescribed today. Chest tightness reported. Reports may be due to hernia issues  Frequency: today  Pertinent Negatives: Patient denies dizziness Disposition: [x] ED /[] Urgent Care (no appt availability in office) / [] Appointment(In office/virtual)/ []  [facility]/ [] Home Care/ [] Refused Recommended Disposition /[] [place] [facility]/ []  Follow-up with PCP Additional Notes:   Recommended ED now .     Reason for Disposition  [1] MODERATE difficulty breathing (e.g., speaks in phrases, SOB even at rest, pulse 100-120) AND [2] NEW-onset or WORSE than normal  Answer Assessment - Initial Assessment Questions 1. RESPIRATORY STATUS: "Describe your breathing?" (e.g., wheezing, shortness of breath, unable to speak, severe coughing)      Shortness of breath  and chest tightness  2. ONSET: "When did this breathing problem begin?"      Worsening today  3. PATTERN "Does the difficult breathing come and go, or has it been constant since it started?"      Constant  4. SEVERITY: "How bad is your breathing?" (e.g., mild, moderate, severe)    - MILD: No SOB at rest, mild SOB with walking, speaks normally in sentences, can lie down, no retractions, pulse < 100.    - MODERATE: SOB at rest, SOB with minimal exertion and prefers to sit, cannot lie down flat, speaks in phrases, mild retractions, audible wheezing, pulse 100-120.    - SEVERE: Very SOB at rest, speaks in single words, struggling to breathe, sitting hunched forward, retractions, pulse > 120      Continues difficulty breathing even after using nebulizer treatments more than every 4 hours with no relief  5. RECURRENT SYMPTOM: "Have you had difficulty breathing before?" If Yes, ask: "When was the last time?" and "What happened that time?"      na 6. CARDIAC HISTORY: "Do you have any history of heart disease?" (e.g.,  heart attack, angina, bypass surgery, angioplasty)      See hx 7. LUNG HISTORY: "Do you have any history of lung disease?"  (e.g., pulmonary embolus, asthma, emphysema)     See hx  8. CAUSE: "What do you think is causing the breathing problem?"      Hernia  9. OTHER SYMPTOMS: "Do you have any other symptoms? (e.g., dizziness, runny nose, cough, chest pain, fever)     Chest tightness  10. O2 SATURATION MONITOR:  "Do you use an oxygen saturation monitor (pulse oximeter) at home?" If Yes, ask: "What is your reading (oxygen level) today?" "What is your usual oxygen saturation reading?" (e.g., 95%)       na 11. PREGNANCY: "Is there any chance you are pregnant?" "When was your last menstrual period?"       na 12. TRAVEL: "Have you traveled out of the country in the last month?" (e.g., travel history, exposures)       na  Protocols used: Breathing Difficulty-A-AH

## 2022-06-01 NOTE — ED Notes (Signed)
ABX delayed due to difficult blood culture collection

## 2022-06-01 NOTE — H&P (Incomplete)
[place]   PATIENT NAME: Summer Murphy    MR#:  [id number]  DATE OF BIRTH:  [birth year]  DATE OF ADMISSION:  06/01/2022  PRIMARY CARE PHYSICIAN: Jonetta Osgood, NP   Patient is coming from: Home  REQUESTING/REFERRING PHYSICIAN: Duffy Bruce, MD  CHIEF COMPLAINT:   Chief Complaint  Patient presents with   Shortness of Breath    HISTORY OF PRESENT ILLNESS:  Summer Murphy is a 67 y.o. Caucasian female with medical history significant for asthma, GERD, hypertension, migraine, prediabetes, restless leg syndrome and anxiety, who presented to the emergency room with a [place] of worsening dyspnea associated productive cough and wheezing over the last couple of days.  He has been getting much worse today.  She has a little hernia and feels like it has been putting pressure on her lungs and worsening her symptoms with associated reflux.  No fever or chills.  No nausea or vomiting or abdominal pain.  She has been having mild chest tightness with her dyspnea without palpitations.  No dysuria, oliguria or hematuria or flank pain.  No bleeding diathesis.  ED Course: When she came to the ER BP was 162/144 with heart rate of 102 and respiratory to 25 with an otherwise normal vital signs.  Labs revealed an ABG with pH 7.57 and HCO3 of 27.5 with pO2 of 121 and pCO2 of 30 and O2 sat of 97.3%.  CMP revealed mild hyponatremia blood glucose of 170 with otherwise unremarkable levels.  BNP was 22.8.  Lactic was 2.5 and later was 1.2 and procalcitonin 0.44.  CBC was within normal.  Influenza, COVID-19 and RSV PCR's came back negative.  Blood cultures were drawn.  EKG as reviewed by me : Sinus tachycardia with rate of 103 with PVCs. Imaging: Portable chest ray showed no acute cardiopulmonary disease.  The patient was given 3 DuoNebs, 2 g of IV magnesium sulfate 125 mg of IV Solu-Medrol.  She was initially placed on BiPAP that was later on tapered down to O2 at 3 L/min by nasal cannula.  She  will be admitted to a medical telemetry bed for further evaluation and management. PAST MEDICAL HISTORY:   Past Medical History:  Diagnosis Date   Acid reflux    Anemia    Anxiety    Arrhythmia    treated with meds and has no current problems   Arthritis    most uncomfortable in knees   Asthma    uses several inhalers   Depression    Fever blister    Hematuria    History of kidney stones    Hypertension    Hypoglycemia    Left flank pain    Migraine    Pre-diabetes    Restless leg    Yeast vaginitis     PAST SURGICAL HISTORY:   Past Surgical History:  Procedure Laterality Date   ABDOMINAL HYSTERECTOMY     AMPUTATION TOE Left 07/31/2016   Procedure: AMPUTATION TOE/MPJ 2nd toe;  Surgeon: Sharlotte Alamo, DPM;  Location: [facility] ORS;  Service: Podiatry;  Laterality: Left;   APPENDECTOMY  1990   BACK SURGERY     low back   BREAST SURGERY     bilateral breast reduction   CARDIAC ELECTROPHYSIOLOGY STUDY AND ABLATION     CHOLECYSTECTOMY  1990   COLONOSCOPY WITH PROPOFOL N/A 01/04/2017   Procedure: COLONOSCOPY WITH PROPOFOL;  Surgeon: Manya Silvas, MD;  Location[facility] ENDOSCOPY;  Service: Endoscopy;  Laterality: N/A;   CORNEAL  TRANSPLANT     ESOPHAGOGASTRODUODENOSCOPY N/A 04/09/2021   Procedure: ESOPHAGOGASTRODUODENOSCOPY (EGD);  Surgeon: Lin Landsman, MD;  Location: [facility] ENDOSCOPY;  Service: Gastroenterology;  Laterality: N/A;   ESOPHAGOGASTRODUODENOSCOPY (EGD) WITH PROPOFOL N/A 01/04/2017   Procedure: ESOPHAGOGASTRODUODENOSCOPY (EGD) WITH PROPOFOL;  Surgeon: Manya Silvas, MD;  Location: [facility]COPY;  Service: Endoscopy;  Laterality: N/A;   EXCISION BONE CYST Left 07/31/2016   Procedure: EXCISION BONE CYST/exostectomy 28124/left 2nd;  Surgeon: Sharlotte Alamo, DPM;  Location: [facility] ORS;  Service: Podiatry;  Laterality: Left;   EXTRACORPOREAL SHOCK WAVE LITHOTRIPSY Left 09/12/2015   Procedure: EXTRACORPOREAL SHOCK WAVE LITHOTRIPSY (ESWL);  Surgeon: Hollice Espy,  MD;  Location: [facility] ORS;  Service: Urology;  Laterality: Left;   FRACTURE SURGERY     left foot   HARDWARE REMOVAL Left 11/14/2020   Procedure: HARDWARE REMOVAL;  Surgeon: Hessie Knows, MD;  Location: [facility] ORS;  Service: Orthopedics;  Laterality: Left;   [place] repair     Fundoplication   JOINT REPLACEMENT Bilateral 2013,2014   total knees   LAPAROSCOPIC HYSTERECTOMY     LITHOTRIPSY     periprosthetic supracondylar fracture of left femur  02/16/2020   [facility]   TONSILLECTOMY     TOTAL KNEE REVISION Right 11/14/2019   Procedure: Revision patella and tibial polyethylene;  Surgeon: Hessie Knows, MD;  Location: [facility] ORS;  Service: Orthopedics;  Laterality: Right;   URETEROSCOPY      SOCIAL HISTORY:   Social History   Tobacco Use   Smoking status: Never    Passive exposure: Past   Smokeless tobacco: Never  Substance Use Topics   Alcohol use: No    FAMILY HISTORY:   Family History  Problem Relation Age of Onset   Hypertension Mother    Stroke Mother    Prostate cancer Father    Kidney Stones Father    Diabetes Brother    Hypertension Brother    Breast cancer Maternal Aunt    Kidney disease Neg Hx     DRUG ALLERGIES:   Allergies  Allergen Reactions   Librium [Chlordiazepoxide] Shortness Of Breath   Metoclopramide Hives and Other (See Comments)    hallucinations    Vanilla Shortness Of Breath    Pt reports allergy to vanilla extract only   Aspirin Hives   Nsaids Rash    Rash/flares asthma issues.   Azithromycin Other (See Comments)    Unsure of what the reaction was.   Buprenorphine Hcl Other (See Comments)    Unsure of what the reaction was.    Chlordiazepoxide Hcl Other (See Comments)    unsure   Morphine Other (See Comments)    Unsure of reaction   Sulfa Antibiotics     Other reaction(s): Unknown   Tolmetin     Other Reaction: Allergy   Amlodipine Besylate Itching and Rash    arms, stomach and forehead   Iron Nausea And Vomiting    Other  reaction(s): Unknown    REVIEW OF SYSTEMS:   ROS As per history of present illness. All pertinent systems were reviewed above. Constitutional, HEENT, cardiovascular, respiratory, GI, GU, musculoskeletal, neuro, psychiatric, endocrine, integumentary and hematologic systems were reviewed and are otherwise negative/unremarkable except for positive findings mentioned above in the HPI.   MEDICATIONS AT HOME:   Prior to Admission medications   Medication Sig Start Date End Date Taking? Authorizing Provider  busPIRone (BUSPAR) 10 MG tablet Take 1 tablet (10 mg total) by mouth 2 (two) times daily. 05/06/22  Yes Abernathy, Yetta Flock,  NP  clonazePAM (KLONOPIN) 1 MG tablet Take 0.5 tablets (0.5 mg total) by mouth 2 (two) times daily as needed for anxiety. 05/06/22  Yes Abernathy, Yetta Flock, NP  diclofenac Sodium (VOLTAREN) 1 % GEL Apply 4 g topically 4 (four) times daily. 08/13/20  Yes Lavera Guise, MD  diltiazem (CARTIA XT) 240 MG 24 hr capsule Take 1 capsule (240 mg total) by mouth daily. 12/11/21  Yes Lavera Guise, MD  DULoxetine (CYMBALTA) 60 MG capsule Take 1 capsule (60 mg total) by mouth at bedtime. 05/06/22  Yes Abernathy, Alyssa, NP  fluticasone furoate-vilanterol (BREO ELLIPTA) 100-25 MCG/ACT AEPB Inhale 1 puff into the lungs daily. 04/29/21  Yes Allyne Gee, MD  gabapentin (NEURONTIN) 600 MG tablet Take 1 tablet (600 mg total) by mouth 2 (two) times daily. 05/06/22  Yes Abernathy, Alyssa, NP  Ipratropium-Albuterol (COMBIVENT RESPIMAT) 20-100 MCG/ACT AERS respimat INHALE 1 PUFF INTO THE LUNGS EVERY 6 HOURS 03/24/22  Yes Abernathy, Alyssa, NP  ipratropium-albuterol (DUONEB) 0.5-2.5 (3) MG/3ML SOLN Take 3 mLs by nebulization every 4 (four) hours as needed (for shortness of breath/wheezing). 05/13/22 06/12/22 Yes Enzo Bi, MD  montelukast (SINGULAIR) 10 MG tablet Take 1 tablet (10 mg total) by mouth at bedtime. 05/06/22  Yes Abernathy, Yetta Flock, NP  pantoprazole (PROTONIX) 40 MG tablet Take 40 mg by mouth 2  (two) times daily.   Yes [provider]  pilocarpine (SALAGEN) 5 MG tablet Take 5 mg by mouth 2 (two) times daily.   Yes [provider]  rOPINIRole (REQUIP) 0.5 MG tablet TAKE 2 TABLETS BY MOUTH TWICE DAILY FOR RESTLESS LEGS 05/14/22  Yes Abernathy, Alyssa, NP  traZODone (DESYREL) 150 MG tablet TAKE 1 TABLET(150 MG) BY MOUTH AT BEDTIME 05/06/22  Yes Abernathy, Alyssa, NP  acetaminophen-codeine (TYLENOL #3) 300-30 MG tablet Take 1 tablet by mouth every 6 (six) hours as needed for moderate pain. 03/02/22   [provider]  benzonatate (TESSALON) 100 MG capsule Take 1 capsule (100 mg total) by mouth 2 (two) times daily as needed for cough. Home med. 05/13/22   Enzo Bi, MD  EPINEPHrine 0.3 mg/0.3 mL IJ SOAJ injection Inject 0.3 mg into the muscle as needed for anaphylaxis. 12/18/20   Lavera Guise, MD  fluticasone [facility]) 50 MCG/ACT nasal spray Place 2 sprays into both nostrils daily as needed for rhinitis or allergies. Home med. 05/13/22   Enzo Bi, MD  hydrOXYzine (ATARAX) 25 MG tablet Take 1-2 tablets (25-50 mg total) by mouth every 6 (six) hours as needed for itching. 10/28/21   Jonetta Osgood, NP  loratadine (CLARITIN) 10 MG tablet Take 1 tablet (10 mg total) by mouth daily as needed for allergies, rhinitis or itching. Home med. 05/13/22   Enzo Bi, MD  ondansetron (ZOFRAN) 4 MG tablet TAKE 1 TABLET BY MOUTH TWICE DAILY AS NEEDED 11/06/21   Lavera Guise, MD  predniSONE (DELTASONE) 10 MG tablet Take 40 mg (4 tablets) from 2/22 to 2/25, then 3 tablets from 2/26 to 2/29, then 2 tablets from 3/1 to 3/4, then 1 tablet from 3/5 until you see Dr. Humphrey Rolls. Patient not taking: Reported on 06/01/2022 05/14/22   Enzo Bi, MD  sennosides-docusate sodium (SENOKOT-S) 8.6-50 MG tablet Take 2 tablets by mouth daily as needed for constipation. Home med 05/13/22   Enzo Bi, MD  SUMAtriptan (IMITREX) 100 MG tablet Take 1 tablet (100 mg total) by mouth every 2 (two) hours as needed (for migraine  headaches.). May repeat in 1 hours if headache persists  or recurs. 09/18/21   Jonetta Osgood, NP      VITAL SIGNS:  Blood pressure 112/75, pulse 95, temperature 98.2 F (36.8 C), resp. rate 16, height '5\' 3"'$  (1.6 m), weight 75 kg, SpO2 97 %.  PHYSICAL EXAMINATION:  Physical Exam  GENERAL:  67 y.o.-year-old Caucasian female patient lying in the bed with no acute distress.  EYES: Pupils equal, round, reactive to light and accommodation. No scleral icterus. Extraocular muscles intact.  HEENT: Head atraumatic, normocephalic. Oropharynx and nasopharynx clear.  NECK:  Supple, no jugular venous distention. No thyroid enlargement, no tenderness.  LUNGS: Diffuse expiratory wheezes with tight expiratory airflow and harsh vesicular breathing.  Diminished bibasal breath sounds.  No use of accessory muscles of respiration.  CARDIOVASCULAR: Regular rate and rhythm, S1, S2 normal. No murmurs, rubs, or gallops.  ABDOMEN: Soft, nondistended, nontender. Bowel sounds present. No organomegaly or mass.  EXTREMITIES: No pedal edema, cyanosis, or clubbing.  NEUROLOGIC: Cranial nerves II through XII are intact. Muscle strength 5/5 in all extremities. Sensation intact. Gait not checked.  PSYCHIATRIC: The patient is alert and oriented x 3.  Normal affect and good eye contact. SKIN: No obvious rash, lesion, or ulcer.   LABORATORY PANEL:   CBC Recent Labs  Lab 06/01/22 1756  WBC 9.9  HGB 12.0  HCT 37.3  PLT 253   ------------------------------------------------------------------------------------------------------------------  Chemistries  Recent Labs  Lab 06/01/22 1756  NA 134*  K 4.7  CL 100  CO2 24  GLUCOSE 170*  BUN 18  CREATININE 0.73  CALCIUM 9.0  AST 28  ALT 28  ALKPHOS 74  BILITOT 0.6   ------------------------------------------------------------------------------------------------------------------  Cardiac Enzymes No results for input(s): "TROPONINI" in the last 168  hours. ------------------------------------------------------------------------------------------------------------------  RADIOLOGY:  DG Chest Portable 1 View  Result Date: 06/01/2022 CLINICAL DATA:  Shortness of breath EXAM: PORTABLE CHEST 1 VIEW COMPARISON:  05/25/2022, chest CT 05/12/2022 FINDINGS: No acute airspace disease or effusion. Stable cardiomediastinal silhouette with moderate hiatal hernia. Surgical clips in the epigastric region. No pneumothorax. Rounded calcific opacities overlying the lower lungs appears to correspond to subcutaneous soft tissue calcification on prior chest CT. IMPRESSION: No active disease. Electronically Signed   By: Donavan Foil M.D.   On: 06/01/2022 18:41      IMPRESSION AND PLAN:  Assessment and Plan: * Acute asthma exacerbation - The patient will be admitted to a medical telemetry bed. - We will continue steroid therapy with IV Solu-Medrol. - She will be placed on DuoNebs 4 times daily and every 4 hours as needed. - Mucolytic therapy will be provided. - We will continue Singulair.   Aspiration pneumonia ([place]) - She has a procalcitonin of 0.44 and lactic acid of 2.5 initially that was later down to 1.2. - This is concerning for aspiration pneumonia especially in the setting of her hiatal hernia and associated reflux. - We will place her for now on IV Rocephin and Zithromax. - Mucolytic therapy and bronchodilator therapy will be provided as mentioned above. - We will follow blood cultures.  Acute respiratory failure with hypoxia (HCC) - O2 protocol will be followed. - This is like secondary to #1 and #2.  Essential hypertension - We will continue Cartia XT.  Anxiety - We will continue her Klonopin and Cymbalta.  Restless leg syndrome - We will continue ropinirole.    DVT prophylaxis: Lovenox.  Advanced Care Planning:  Code Status: full code.  Family Communication:  The plan of care was discussed in details with the patient (and  family). I answered all questions. The patient agreed to proceed with the above mentioned plan. Further management will depend upon hospital course. Disposition Plan: Back to previous home environment Consults called: none.  All the records are reviewed and case discussed with ED provider.  Status is: Inpatient    At the time of the admission, it appears that the appropriate admission status for this patient is inpatient.  This is judged to be reasonable and necessary in order to provide the required intensity of service to ensure the patient's safety given the presenting symptoms, physical exam findings and initial radiographic and laboratory data in the context of comorbid conditions.  The patient requires inpatient status due to high intensity of service, high risk of further deterioration and high frequency of surveillance required.  I certify that at the time of admission, it is my clinical judgment that the patient will require inpatient hospital care extending more than 2 midnights.                            Dispo: The patient is from: Home              Anticipated d/c is to: Home              Patient currently is not medically stable to d/c.              Difficult to place patient: No  Christel Mormon M.D on 06/02/2022 at 1:50 AM  [facility]   From 7 PM-7 AM, contact night-coverage [email or web address]  CC: Primary care physician; Jonetta Osgood, NP

## 2022-06-01 NOTE — ED Triage Notes (Signed)
C/O SOB for several days, worse today.    Patient respirations using accessory muscles. Dyspnea at rest.  Unable to speak in full sentences.

## 2022-06-01 NOTE — ED Notes (Signed)
Pt had this RN call friend Marcie Bal (in chart) to update her on pt status. Pt aware.

## 2022-06-01 NOTE — H&P (Incomplete)
[place]   PATIENT NAME: Summer Murphy    MR#:  [id number]  DATE OF BIRTH:  [birth year]  DATE OF ADMISSION:  06/01/2022  PRIMARY CARE PHYSICIAN: Jonetta Osgood, NP   Patient is coming from: Home  REQUESTING/REFERRING PHYSICIAN: Duffy Bruce, MD  CHIEF COMPLAINT:   Chief Complaint  Patient presents with  . Shortness of Breath    HISTORY OF PRESENT ILLNESS:  Summer Murphy is a 67 y.o. female with medical history significant for asthma, GERD, hypertension, migraine, prediabetes, restless leg syndrome and anxiety, who presented to the emergency room with a [place] of worsening dyspnea associated dry cough and wheezing over the last couple of days.  He has been getting much worse today.  No fever or chills.  No nausea or vomiting or abdominal pain.  She has been having mild chest tightness with her dyspnea without palpitations.  No dysuria, oliguria or hematuria or flank pain.  No bleeding diathesis.  ED Course: When she came to the ER BP was 162/144 with heart rate of 102 and respiratory to 25 with an otherwise normal vital signs.  Labs revealed an ABG with pH 7.57 and HCO3 of 27.5 with pO2 of 121 and pCO2 of 30 and O2 sat of 97.3%.  CMP revealed mild hyponatremia blood glucose of 170 with otherwise unremarkable levels.  BNP was 22.8.  Lactic was 2.5 and later was 1.2 and procalcitonin 0.44.  CBC was within normal.  Influenza, COVID-19 and RSV PCR's came back negative.  Blood cultures were drawn.  EKG as reviewed by me : Sinus tachycardia with rate of 103 with PVCs. Imaging: Portable chest ray showed no acute cardiopulmonary disease.  The patient was given 3 DuoNebs, 2 g of IV magnesium sulfate 125 mg of IV Solu-Medrol.  She was initially placed on BiPAP that was later on today.  Of 2 O2 at 3 L/min by nasal cannula.  She will be admitted to a medical telemetry bed for further evaluation and management. PAST MEDICAL HISTORY:   Past Medical History:  Diagnosis Date   . Acid reflux   . Anemia   . Anxiety   . Arrhythmia    treated with meds and has no current problems  . Arthritis    most uncomfortable in knees  . Asthma    uses several inhalers  . Depression   . Fever blister   . Hematuria   . History of kidney stones   . Hypertension   . Hypoglycemia   . Left flank pain   . Migraine   . Pre-diabetes   . Restless leg   . Yeast vaginitis     PAST SURGICAL HISTORY:   Past Surgical History:  Procedure Laterality Date  . ABDOMINAL HYSTERECTOMY    . AMPUTATION TOE Left 07/31/2016   Procedure: AMPUTATION TOE/MPJ 2nd toe;  Surgeon: Sharlotte Alamo, DPM;  Location: [facility] ORS;  Service: Podiatry;  Laterality: Left;  . APPENDECTOMY  1990  . BACK SURGERY     low back  . BREAST SURGERY     bilateral breast reduction  . CARDIAC ELECTROPHYSIOLOGY STUDY AND ABLATION    . CHOLECYSTECTOMY  1990  . COLONOSCOPY WITH PROPOFOL N/A 01/04/2017   Procedure: COLONOSCOPY WITH PROPOFOL;  Surgeon: Manya Silvas, MD;  Location: [facility] ENDOSCOPY;  Service: Endoscopy;  Laterality: N/A;  . CORNEAL TRANSPLANT    . ESOPHAGOGASTRODUODENOSCOPY N/A 04/09/2021   Procedure: ESOPHAGOGASTRODUODENOSCOPY (EGD);  Surgeon: Lin Landsman, MD;  Location: [facility] ENDOSCOPY;  Service: Gastroenterology;  Laterality: N/A;  . ESOPHAGOGASTRODUODENOSCOPY (EGD) WITH PROPOFOL N/A 01/04/2017   Procedure: ESOPHAGOGASTRODUODENOSCOPY (EGD) WITH PROPOFOL;  Surgeon: Manya Silvas, MD;  Location: [facility] ENDOSCOPY;  Service: Endoscopy;  Laterality: N/A;  . EXCISION BONE CYST Left 07/31/2016   Procedure: EXCISION BONE CYST/exostectomy 28124/left 2nd;  Surgeon: Sharlotte Alamo, DPM;  Location: [facility] ORS;  Service: Podiatry;  Laterality: Left;  . EXTRACORPOREAL SHOCK WAVE LITHOTRIPSY Left 09/12/2015   Procedure: EXTRACORPOREAL SHOCK WAVE LITHOTRIPSY (ESWL);  Surgeon: Hollice Espy, MD;  Location: [facility] ORS;  Service: Urology;  Laterality: Left;  . FRACTURE SURGERY     left foot  . HARDWARE REMOVAL  Left 11/14/2020   Procedure: HARDWARE REMOVAL;  Surgeon: Hessie Knows, MD;  Location: [facility] ORS;  Service: Orthopedics;  Laterality: Left;  . HH repair     Fundoplication  . JOINT REPLACEMENT Bilateral 2013,2014   total knees  . LAPAROSCOPIC HYSTERECTOMY    . LITHOTRIPSY    . periprosthetic supracondylar fracture of left femur  02/16/2020   [facility]  . TONSILLECTOMY    . TOTAL KNEE REVISION Right 11/14/2019   Procedure: Revision patella and tibial polyethylene;  Surgeon: Hessie Knows, MD;  Location: [facility] ORS;  Service: Orthopedics;  Laterality: Right;  . URETEROSCOPY      SOCIAL HISTORY:   Social History   Tobacco Use  . Smoking status: Never    Passive exposure: Past  . Smokeless tobacco: Never  Substance Use Topics  . Alcohol use: No    FAMILY HISTORY:   Family History  Problem Relation Age of Onset  . Hypertension Mother   . Stroke Mother   . Prostate cancer Father   . Kidney Stones Father   . Diabetes Brother   . Hypertension Brother   . Breast cancer Maternal Aunt   . Kidney disease Neg Hx     DRUG ALLERGIES:   Allergies  Allergen Reactions  . Librium [Chlordiazepoxide] Shortness Of Breath  . Metoclopramide Hives and Other (See Comments)    hallucinations   . Vanilla Shortness Of Breath    Pt reports allergy to vanilla extract only  . Aspirin Hives  . Nsaids Rash    Rash/flares asthma issues.  . Azithromycin Other (See Comments)    Unsure of what the reaction was.  . Buprenorphine Hcl Other (See Comments)    Unsure of what the reaction was.   . Chlordiazepoxide Hcl Other (See Comments)    unsure  . Morphine Other (See Comments)    Unsure of reaction  . Sulfa Antibiotics     Other reaction(s): Unknown  . Tolmetin     Other Reaction: Allergy  . Amlodipine Besylate Itching and Rash    arms, stomach and forehead  . Iron Nausea And Vomiting    Other reaction(s): Unknown    REVIEW OF SYSTEMS:   ROS As per history of present illness.  All pertinent systems were reviewed above. Constitutional, HEENT, cardiovascular, respiratory, GI, GU, musculoskeletal, neuro, psychiatric, endocrine, integumentary and hematologic systems were reviewed and are otherwise negative/unremarkable except for positive findings mentioned above in the HPI.   MEDICATIONS AT HOME:   Prior to Admission medications   Medication Sig Start Date End Date Taking? Authorizing Provider  busPIRone (BUSPAR) 10 MG tablet Take 1 tablet (10 mg total) by mouth 2 (two) times daily. 05/06/22  Yes Abernathy, Yetta Flock, NP  clonazePAM (KLONOPIN) 1 MG tablet Take 0.5 tablets (0.5 mg total) by mouth 2 (two) times daily as needed for anxiety. 05/06/22  Yes Abernathy, Alyssa, NP  diclofenac Sodium (VOLTAREN) 1 % GEL Apply 4 g topically 4 (four) times daily. 08/13/20  Yes Lavera Guise, MD  diltiazem (CARTIA XT) 240 MG 24 hr capsule Take 1 capsule (240 mg total) by mouth daily. 12/11/21  Yes Lavera Guise, MD  DULoxetine (CYMBALTA) 60 MG capsule Take 1 capsule (60 mg total) by mouth at bedtime. 05/06/22  Yes Abernathy, Alyssa, NP  fluticasone furoate-vilanterol (BREO ELLIPTA) 100-25 MCG/ACT AEPB Inhale 1 puff into the lungs daily. 04/29/21  Yes Allyne Gee, MD  gabapentin (NEURONTIN) 600 MG tablet Take 1 tablet (600 mg total) by mouth 2 (two) times daily. 05/06/22  Yes Abernathy, Alyssa, NP  Ipratropium-Albuterol (COMBIVENT RESPIMAT) 20-100 MCG/ACT AERS respimat INHALE 1 PUFF INTO THE LUNGS EVERY 6 HOURS 03/24/22  Yes Abernathy, Alyssa, NP  ipratropium-albuterol (DUONEB) 0.5-2.5 (3) MG/3ML SOLN Take 3 mLs by nebulization every 4 (four) hours as needed (for shortness of breath/wheezing). 05/13/22 06/12/22 Yes Enzo Bi, MD  montelukast (SINGULAIR) 10 MG tablet Take 1 tablet (10 mg total) by mouth at bedtime. 05/06/22  Yes Abernathy, Yetta Flock, NP  pantoprazole (PROTONIX) 40 MG tablet Take 40 mg by mouth 2 (two) times daily.   Yes [provider]  pilocarpine (SALAGEN) 5 MG tablet Take 5  mg by mouth 2 (two) times daily.   Yes [provider]  rOPINIRole (REQUIP) 0.5 MG tablet TAKE 2 TABLETS BY MOUTH TWICE DAILY FOR RESTLESS LEGS 05/14/22  Yes Abernathy, Alyssa, NP  traZODone (DESYREL) 150 MG tablet TAKE 1 TABLET(150 MG) BY MOUTH AT BEDTIME 05/06/22  Yes Abernathy, Alyssa, NP  acetaminophen-codeine (TYLENOL #3) 300-30 MG tablet Take 1 tablet by mouth every 6 (six) hours as needed for moderate pain. 03/02/22   [provider]  benzonatate (TESSALON) 100 MG capsule Take 1 capsule (100 mg total) by mouth 2 (two) times daily as needed for cough. Home med. 05/13/22   Enzo Bi, MD  EPINEPHrine 0.3 mg/0.3 mL IJ SOAJ injection Inject 0.3 mg into the muscle as needed for anaphylaxis. 12/18/20   Lavera Guise, MD  fluticasone [facility]) 50 MCG/ACT nasal spray Place 2 sprays into both nostrils daily as needed for rhinitis or allergies. Home med. 05/13/22   Enzo Bi, MD  hydrOXYzine (ATARAX) 25 MG tablet Take 1-2 tablets (25-50 mg total) by mouth every 6 (six) hours as needed for itching. 10/28/21   Jonetta Osgood, NP  loratadine (CLARITIN) 10 MG tablet Take 1 tablet (10 mg total) by mouth daily as needed for allergies, rhinitis or itching. Home med. 05/13/22   Enzo Bi, MD  ondansetron (ZOFRAN) 4 MG tablet TAKE 1 TABLET BY MOUTH TWICE DAILY AS NEEDED 11/06/21   Lavera Guise, MD  predniSONE (DELTASONE) 10 MG tablet Take 40 mg (4 tablets) from 2/22 to 2/25, then 3 tablets from 2/26 to 2/29, then 2 tablets from 3/1 to 3/4, then 1 tablet from 3/5 until you see Dr. Humphrey Rolls. Patient not taking: Reported on 06/01/2022 05/14/22   Enzo Bi, MD  sennosides-docusate sodium (SENOKOT-S) 8.6-50 MG tablet Take 2 tablets by mouth daily as needed for constipation. Home med 05/13/22   Enzo Bi, MD  SUMAtriptan (IMITREX) 100 MG tablet Take 1 tablet (100 mg total) by mouth every 2 (two) hours as needed (for migraine headaches.). May repeat in 1 hours if headache persists or recurs. 09/18/21   Jonetta Osgood, NP      VITAL SIGNS:  Blood pressure 112/75, pulse 95, temperature 98.2 F (36.8  C), resp. rate 16, height '5\' 3"'$  (1.6 m), weight 75 kg, SpO2 99 %.  PHYSICAL EXAMINATION:  Physical Exam  GENERAL:  67 y.o.-year-old patient lying in the bed with no acute distress.  EYES: Pupils equal, round, reactive to light and accommodation. No scleral icterus. Extraocular muscles intact.  HEENT: Head atraumatic, normocephalic. Oropharynx and nasopharynx clear.  NECK:  Supple, no jugular venous distention. No thyroid enlargement, no tenderness.  LUNGS: Diffuse expiratory wheezes with tight expiratory airflow and harsh vesicular breathing.  Diminished bibasal breath sounds.  No use of accessory muscles of respiration.  CARDIOVASCULAR: Regular rate and rhythm, S1, S2 normal. No murmurs, rubs, or gallops.  ABDOMEN: Soft, nondistended, nontender. Bowel sounds present. No organomegaly or mass.  EXTREMITIES: No pedal edema, cyanosis, or clubbing.  NEUROLOGIC: Cranial nerves II through XII are intact. Muscle strength 5/5 in all extremities. Sensation intact. Gait not checked.  PSYCHIATRIC: The patient is alert and oriented x 3.  Normal affect and good eye contact. SKIN: No obvious rash, lesion, or ulcer.   LABORATORY PANEL:   CBC Recent Labs  Lab 06/01/22 1756  WBC 9.9  HGB 12.0  HCT 37.3  PLT 253   ------------------------------------------------------------------------------------------------------------------  Chemistries  Recent Labs  Lab 06/01/22 1756  NA 134*  K 4.7  CL 100  CO2 24  GLUCOSE 170*  BUN 18  CREATININE 0.73  CALCIUM 9.0  AST 28  ALT 28  ALKPHOS 74  BILITOT 0.6   ------------------------------------------------------------------------------------------------------------------  Cardiac Enzymes No results for input(s): "TROPONINI" in the last 168  hours. ------------------------------------------------------------------------------------------------------------------  RADIOLOGY:  DG Chest Portable 1 View  Result Date: 06/01/2022 CLINICAL DATA:  Shortness of breath EXAM: PORTABLE CHEST 1 VIEW COMPARISON:  05/25/2022, chest CT 05/12/2022 FINDINGS: No acute airspace disease or effusion. Stable cardiomediastinal silhouette with moderate hiatal hernia. Surgical clips in the epigastric region. No pneumothorax. Rounded calcific opacities overlying the lower lungs appears to correspond to subcutaneous soft tissue calcification on prior chest CT. IMPRESSION: No active disease. Electronically Signed   By: Donavan Foil M.D.   On: 06/01/2022 18:41      IMPRESSION AND PLAN:  Assessment and Plan: No notes have been filed under this hospital service. Service: Hospitalist      DVT prophylaxis: Lovenox***  Advanced Care Planning:  Code Status: full code***  Family Communication:  The plan of care was discussed in details with the patient (and family). I answered all questions. The patient agreed to proceed with the above mentioned plan. Further management will depend upon hospital course. Disposition Plan: Back to previous home environment Consults called: none***  All the records are reviewed and case discussed with ED provider.  Status is: Inpatient {Inpatient:23812}   At the time of the admission, it appears that the appropriate admission status for this patient is inpatient.  This is judged to be reasonable and necessary in order to provide the required intensity of service to ensure the patient's safety given the presenting symptoms, physical exam findings and initial radiographic and laboratory data in the context of comorbid conditions.  The patient requires inpatient status due to high intensity of service, high risk of further deterioration and high frequency of surveillance required.  I certify that at the time of admission, it is my  clinical judgment that the patient will require inpatient hospital care extending more than 2 midnights.  Dispo: The patient is from: Home              Anticipated d/c is to: Home              Patient currently is not medically stable to d/c.              Difficult to place patient: No  Christel Mormon M.D on 06/01/2022 at 9:49 PM  [facility]   From 7 PM-7 AM, contact night-coverage [email or web address]  CC: Primary care physician; Jonetta Osgood, NP

## 2022-06-02 ENCOUNTER — Encounter: Payer: Self-pay | Admitting: Family Medicine

## 2022-06-02 DIAGNOSIS — F419 Anxiety disorder, unspecified: Secondary | ICD-10-CM | POA: Diagnosis present

## 2022-06-02 DIAGNOSIS — I10 Essential (primary) hypertension: Secondary | ICD-10-CM | POA: Diagnosis present

## 2022-06-02 DIAGNOSIS — J9601 Acute respiratory failure with hypoxia: Secondary | ICD-10-CM | POA: Diagnosis present

## 2022-06-02 DIAGNOSIS — F32A Depression, unspecified: Secondary | ICD-10-CM | POA: Diagnosis present

## 2022-06-02 DIAGNOSIS — J45901 Unspecified asthma with (acute) exacerbation: Secondary | ICD-10-CM

## 2022-06-02 DIAGNOSIS — J690 Pneumonitis due to inhalation of food and vomit: Secondary | ICD-10-CM | POA: Diagnosis present

## 2022-06-02 HISTORY — DX: Pneumonitis due to inhalation of food and vomit: J69.0

## 2022-06-02 LAB — CBC
HCT: 36.5 % (ref 36.0–46.0)
Hemoglobin: 11.6 g/dL — ABNORMAL LOW (ref 12.0–15.0)
MCH: 29.7 pg (ref 26.0–34.0)
MCHC: 31.8 g/dL (ref 30.0–36.0)
MCV: 93.6 fL (ref 80.0–100.0)
Platelets: 203 10*3/uL (ref 150–400)
RBC: 3.9 MIL/uL (ref 3.87–5.11)
RDW: 14.4 % (ref 11.5–15.5)
WBC: 11.4 10*3/uL — ABNORMAL HIGH (ref 4.0–10.5)
nRBC: 0 % (ref 0.0–0.2)

## 2022-06-02 LAB — GLUCOSE, CAPILLARY
Glucose-Capillary: 242 mg/dL — ABNORMAL HIGH (ref 70–99)
Glucose-Capillary: 252 mg/dL — ABNORMAL HIGH (ref 70–99)
Glucose-Capillary: 159 mg/dL — ABNORMAL HIGH (ref 70–99)

## 2022-06-02 LAB — BASIC METABOLIC PANEL
Anion gap: 13 (ref 5–15)
BUN: 20 mg/dL (ref 8–23)
CO2: 19 mmol/L — ABNORMAL LOW (ref 22–32)
Calcium: 8.2 mg/dL — ABNORMAL LOW (ref 8.9–10.3)
Chloride: 103 mmol/L (ref 98–111)
Creatinine, Ser: 0.77 mg/dL (ref 0.44–1.00)
GFR, Estimated: >60 mL/min (ref >60)
Glucose, Bld: 342 mg/dL — ABNORMAL HIGH (ref 70–99)
Potassium: 4.4 mmol/L (ref 3.5–5.1)
Sodium: 135 mmol/L (ref 135–145)

## 2022-06-02 MED ORDER — INSULIN ASPART 100 UNIT/ML IJ SOLN: 0.0000 [IU] | Freq: Every day | INTRAMUSCULAR | Status: DC

## 2022-06-02 MED ORDER — IPRATROPIUM-ALBUTEROL 0.5-2.5 (3) MG/3ML IN SOLN
3.0000 mL | RESPIRATORY_TRACT | Status: DC | PRN
  Administered 2022-06-02: 3 mL via RESPIRATORY_TRACT
  Filled 2022-06-02: qty 3

## 2022-06-02 MED ORDER — ROPINIROLE HCL 1 MG PO TABS
0.5000 mg | ORAL_TABLET | Freq: Three times a day (TID) | ORAL | Status: DC
  Administered 2022-06-02 – 2022-06-03 (×2): 0.5 mg via ORAL
  Filled 2022-06-02 (×2): qty 1

## 2022-06-02 MED ORDER — INSULIN ASPART 100 UNIT/ML IJ SOLN
3.0000 [IU] | Freq: Three times a day (TID) | INTRAMUSCULAR | Status: DC
  Administered 2022-06-02 – 2022-06-03 (×3): 3 [IU] via SUBCUTANEOUS
  Filled 2022-06-02 (×3): qty 1

## 2022-06-02 MED ORDER — IBUPROFEN 400 MG PO TABS
400.0000 mg | ORAL_TABLET | Freq: Once | ORAL | Status: AC
  Administered 2022-06-02: 400 mg via ORAL
  Filled 2022-06-02: qty 1

## 2022-06-02 MED ORDER — INSULIN ASPART 100 UNIT/ML IJ SOLN
0.0000 [IU] | Freq: Three times a day (TID) | INTRAMUSCULAR | Status: DC
  Administered 2022-06-02: 8 [IU] via SUBCUTANEOUS
  Administered 2022-06-02: 5 [IU] via SUBCUTANEOUS
  Administered 2022-06-03: 2 [IU] via SUBCUTANEOUS
  Filled 2022-06-02 (×3): qty 1

## 2022-06-02 NOTE — Assessment & Plan Note (Addendum)
-   No baseline oxygen use, currently on 3 L-wean as tolerated - This is like secondary to #1 and #2.

## 2022-06-02 NOTE — Progress Notes (Signed)
SATURATION QUALIFICATIONS: (This note is used to comply with regulatory documentation for home oxygen)  Patient Saturations on Room Air at Rest = 100%  Patient Saturations on Room Air while Ambulating = 95%  Patient Saturations on 2  Liters of oxygen while Ambulating = 100%  Please briefly explain why patient needs home oxygen:

## 2022-06-02 NOTE — Assessment & Plan Note (Signed)
-   We will continue Cartia XT.

## 2022-06-02 NOTE — Assessment & Plan Note (Addendum)
Wheezing but remain on oxygen with no baseline use. -Continue with steroid -Continue with bronchodilator -Continue with home Singulair -Try weaning from oxygen

## 2022-06-02 NOTE — Inpatient Diabetes Management (Addendum)
Inpatient Diabetes Program Recommendations  AACE/ADA: New Consensus Statement on Inpatient Glycemic Control   Target Ranges:  Prepandial:   less than 140 mg/dL      Peak postprandial:   less than 180 mg/dL (1-2 hours)      Critically ill patients:  140 - 180 mg/dL    Latest Reference Range & Units 06/01/22 17:56 06/02/22 04:39  Glucose 70 - 99 mg/dL 170 (H) 342 (H)    Latest Reference Range & Units 08/28/21 15:25 05/13/22 06:32  Hemoglobin A1C 4.8 - 5.6 % 5.9 ! 6.8 (H)   Review of Glycemic Control  Diabetes history: PreDM Outpatient Diabetes medications: None Current orders for Inpatient glycemic control: None; Prednisone 40 mg QAM  Inpatient Diabetes Program Recommendations:    Insulin: Please consider ordering CBGs AC&HS with Novolog 0-9 units TID with meals and Novolog 0-5 units QHS.  HbgA1C: A1C 6.8% on 05/13/22 during last hospitalization. Per D/C summary on 05/13/22, it was noted patient has Prediabets hx and hyperglycemia likely due to steroids.     Outpatient DM meds: May need to consider prescribing oral DM medications at discharge, especially if patient will be continued on steroids as an outpatient.  NOTE: Patient admitted with asthma exacerbation, aspiration pneumonia, and respiratory failure. Patient was recently inpatient 2/20-2/21/24 and was ordered steroids and had noted hyperglycemia. A1C was 6/8% on 05/13/22 and per Discharge summary on 05/13/21, it was noted that hyperglycemia likely due to steroids. If patient is continued on steroids as an outpatient, may need to consider ordering outpatient DM medication and have patient follow up with PCP regarding glycemic control.   Thanks, Barnie Alderman, RN, MSN, [place] Diabetes Coordinator Inpatient Diabetes Program [phone] (Team Pager from 8am to [place])

## 2022-06-02 NOTE — Assessment & Plan Note (Addendum)
-   She has a procalcitonin of 0.44 and lactic acid of 2.5 initially that was later down to 1.2.  Preliminary blood cultures negative. - This is concerning for aspiration pneumonia especially in the setting of her hiatal hernia and associated reflux. -Continue with Unasyn

## 2022-06-02 NOTE — Hospital Course (Addendum)
Taken from H&P.  Summer Murphy is a 67 y.o. Caucasian female with medical history significant for asthma, GERD, hypertension, migraine, prediabetes, restless leg syndrome and anxiety, who presented to the emergency room with complain of worsening dyspnea,  associated productive cough and wheezing over the last couple of days .  ED Course: When she came to the ER BP was 162/144 with heart rate of 102 and respiratory to 25 with an otherwise normal vital signs.  Labs revealed an ABG with pH 7.57 and HCO3 of 27.5 with pO2 of 121 and pCO2 of 30 and O2 sat of 97.3%.  CMP revealed mild hyponatremia blood glucose of 170 with otherwise unremarkable levels.  BNP was 22.8.  Lactic was 2.5 and later was 1.2 and procalcitonin 0.44.  CBC was within normal.  Influenza, COVID-19 and RSV PCR's came back negative.  Blood cultures were drawn.   EKG : Sinus tachycardia with rate of 103 with PVCs. Imaging: Portable chest ray showed no acute cardiopulmonary disease.  Patient received 3 DuoNeb treatment, 2 g of IV magnesium sulfate and 125 mg of IV Solu-Medrol.  Initially placed on BiPAP and then later transitioned to 3 L of oxygen. She was also started on Unasyn for concern of aspiration pneumonia.  3/12: Vital stable, saturating 99% on 3 L of oxygen-we will try to wean.  Labs with leukocytosis at 11.4.  Significantly elevated CBG above 300, patient has an history of prediabetes with most recent A1c of 6.8 on 2/24.  Added moderate SSI and 3 units with meal. Patient is currently on steroid.  Might get benefit from starting on p.o. meds on discharge.  3/13.  Patient feeling better.  Currently breathing comfortably on room air.  She states that she always has a little short of breath but a lot better than when she came in.

## 2022-06-02 NOTE — Assessment & Plan Note (Signed)
-   We will continue ropinirole. 

## 2022-06-02 NOTE — Assessment & Plan Note (Signed)
-   We will continue her Klonopin and Cymbalta. 

## 2022-06-02 NOTE — Progress Notes (Signed)
Mobility Specialist - Progress Note   06/02/22 1502  Mobility  Activity Ambulated independently to bathroom  Level of Assistance Independent  Assistive Device None  Distance Ambulated (ft) 20 ft  Activity Response Tolerated well  $Mobility charge 1 Mobility   [place]ie [place] Mobility Specialist 06/02/22 3:02 PM

## 2022-06-02 NOTE — Assessment & Plan Note (Signed)
CBG significantly elevated above 300 after getting IV steroid. Recent A1c of 6.8 on 2/24-which makes her diabetic. Not on any medications at home. -Added SSI with mealtime coverage -Might need p.o. medications on discharge

## 2022-06-02 NOTE — Progress Notes (Signed)
Progress Note   Patient: Summer Murphy [id number] DOB: 08/03/1955 DOA: 06/01/2022     1 DOS: the patient was seen and examined on 06/02/2022   Brief hospital course: Taken from H&P.  Summer Murphy is a 67 y.o. Caucasian female with medical history significant for asthma, GERD, hypertension, migraine, prediabetes, restless leg syndrome and anxiety, who presented to the emergency room with complain of worsening dyspnea,  associated productive cough and wheezing over the last couple of days .  ED Course: When she came to the ER BP was 162/144 with heart rate of 102 and respiratory to 25 with an otherwise normal vital signs.  Labs revealed an ABG with pH 7.57 and HCO3 of 27.5 with pO2 of 121 and pCO2 of 30 and O2 sat of 97.3%.  CMP revealed mild hyponatremia blood glucose of 170 with otherwise unremarkable levels.  BNP was 22.8.  Lactic was 2.5 and later was 1.2 and procalcitonin 0.44.  CBC was within normal.  Influenza, COVID-19 and RSV PCR's came back negative.  Blood cultures were drawn.   EKG : Sinus tachycardia with rate of 103 with PVCs. Imaging: Portable chest ray showed no acute cardiopulmonary disease.  Patient received 3 DuoNeb treatment, 2 g of IV magnesium sulfate and 125 mg of IV Solu-Medrol.  Initially placed on BiPAP and then later transitioned to 3 L of oxygen. She was also started on Unasyn for concern of aspiration pneumonia.  3/12: Vital stable, saturating 99% on 3 L of oxygen-we will try to wean.  Labs with leukocytosis at 11.4.  Significantly elevated CBG above 300, patient has an history of prediabetes with most recent A1c of 6.8 on 2/24.  Added moderate SSI and 3 units with meal. Patient is currently on steroid.  Might get benefit from starting on p.o. meds on discharge.   Assessment and Plan: * Acute asthma exacerbation Wheezing but remain on oxygen with no baseline use. -Continue with steroid -Continue with bronchodilator -Continue with home Singulair -Try  weaning from oxygen   Aspiration pneumonia ([place]) - She has a procalcitonin of 0.44 and lactic acid of 2.5 initially that was later down to 1.2.  Preliminary blood cultures negative. - This is concerning for aspiration pneumonia especially in the setting of her hiatal hernia and associated reflux. -Continue with Unasyn  Acute respiratory failure with hypoxia (HCC) - No baseline oxygen use, currently on 3 L-wean as tolerated - This is like secondary to #1 and #2.  Essential hypertension - We will continue Cartia XT.  Restless leg syndrome - We will continue ropinirole.  Anxiety - We will continue her Klonopin and Cymbalta.  Hyperglycemia CBG significantly elevated above 300 after getting IV steroid. Recent A1c of 6.8 on 2/24-which makes her diabetic. Not on any medications at home. -Added SSI with mealtime coverage -Might need p.o. medications on discharge   Subjective: Patient continued to feel tight chest and shortness of breath.  Physical Exam: Vitals:   06/02/22 0739 06/02/22 0823 06/02/22 1008 06/02/22 1128  BP:  131/73    Pulse:  (!) 105    Resp:  18    Temp:  98.6 F (37 C)    TempSrc:  Oral    SpO2: 97% 99% 95% 98%  Weight:      Height:       General.  Anxious lady, in no acute distress. Pulmonary.  Lungs clear bilaterally, normal respiratory effort. CV.  Regular rate and rhythm, no JVD, rub or murmur. Abdomen.  Soft, nontender,  nondistended, BS positive. CNS.  Alert and oriented .  No focal neurologic deficit. Extremities.  No edema, no cyanosis, pulses intact and symmetrical. Psychiatry.  Patient appears little anxious  Data Reviewed: Prior data reviewed  Family Communication: Discussed with cousin who was listed in chart.  Disposition: Status is: Inpatient Remains inpatient appropriate because: Severity of illness  Planned Discharge Destination: Home  DVT prophylaxis.  Lovenox Time spent: 45 minutes  This record has been created using [employer]. Errors have been sought and corrected,but may not always be located. Such creation errors do not reflect on the standard of care.   Author: Lorella Nimrod, MD 06/02/2022 12:45 PM  For on call review [email or web address].

## 2022-06-03 DIAGNOSIS — E1165 Type 2 diabetes mellitus with hyperglycemia: Secondary | ICD-10-CM | POA: Insufficient documentation

## 2022-06-03 LAB — GLUCOSE, CAPILLARY: Glucose-Capillary: 135 mg/dL — ABNORMAL HIGH (ref 70–99)

## 2022-06-03 MED ORDER — PREDNISONE 20 MG PO TABS: 40.0000 mg | ORAL_TABLET | Freq: Every day | ORAL | 0 refills | Status: AC

## 2022-06-03 MED ORDER — AMOXICILLIN-POT CLAVULANATE 875-125 MG PO TABS: 1.0000 | ORAL_TABLET | Freq: Two times a day (BID) | ORAL | 0 refills | Status: AC

## 2022-06-03 NOTE — Assessment & Plan Note (Signed)
Patient on room air.  Patient not wheezing today.  Will send home on prednisone.  Patient has inhalers and nebulizers at home.

## 2022-06-03 NOTE — Assessment & Plan Note (Signed)
Required BiPAP initially for respiratory distress.  This has resolved and patient breathing comfortably on room air.

## 2022-06-03 NOTE — Assessment & Plan Note (Signed)
Hyperglycemia secondary to steroids.  Hemoglobin A1c 6.8 making her diabetic.  Low carbohydrate diet discussed.  Patient's sugars may be a little bit higher for the next 2 days while on steroids.

## 2022-06-03 NOTE — Assessment & Plan Note (Signed)
-   We will continue her Klonopin and Cymbalta.

## 2022-06-03 NOTE — Assessment & Plan Note (Signed)
-   She has a procalcitonin of 0.44 and lactic acid of 2.5 initially that was later down to 1.2.  Preliminary blood cultures negative. -Imaging was negative for pneumonia even though she was given Unasyn.  I will finish a 5-day course with Augmentin for few days upon discharge.

## 2022-06-03 NOTE — Progress Notes (Signed)
Discharge instructions were reviewed with patient and questions were answered. IV was taken out. Belongings collected by patient. Waiting on patient's ride to pick her up.

## 2022-06-03 NOTE — Assessment & Plan Note (Signed)
-   We will continue ropinirole. 

## 2022-06-03 NOTE — Discharge Summary (Signed)
Physician Discharge Summary   Patient: Summer Murphy MRN: [id number] DOB: Aug 02, 1955  Admit date:     06/01/2022  Discharge date: 06/03/22  Discharge Physician: Loletha Grayer   PCP: Jonetta Osgood, NP   Recommendations at discharge:   Follow-up PCP 5 days  Discharge Diagnoses: Principal Problem:   Acute asthma exacerbation Active Problems:   Aspiration pneumonia (HCC)   Acute respiratory failure with hypoxia (HCC)   Essential hypertension   Restless leg syndrome   Anxiety   Uncontrolled type 2 diabetes mellitus with hyperglycemia, without long-term current use of insulin [facility])  Hospital Course: Taken from H&P.  Summer Murphy is a 67 y.o. Caucasian female with medical history significant for asthma, GERD, hypertension, migraine, prediabetes, restless leg syndrome and anxiety, who presented to the emergency room with complain of worsening dyspnea,  associated productive cough and wheezing over the last couple of days .  ED Course: When she came to the ER BP was 162/144 with heart rate of 102 and respiratory to 25 with an otherwise normal vital signs.  Labs revealed an ABG with pH 7.57 and HCO3 of 27.5 with pO2 of 121 and pCO2 of 30 and O2 sat of 97.3%.  CMP revealed mild hyponatremia blood glucose of 170 with otherwise unremarkable levels.  BNP was 22.8.  Lactic was 2.5 and later was 1.2 and procalcitonin 0.44.  CBC was within normal.  Influenza, COVID-19 and RSV PCR's came back negative.  Blood cultures were drawn.   EKG : Sinus tachycardia with rate of 103 with PVCs. Imaging: Portable chest ray showed no acute cardiopulmonary disease.  Patient received 3 DuoNeb treatment, 2 g of IV magnesium sulfate and 125 mg of IV Solu-Medrol.  Initially placed on BiPAP and then later transitioned to 3 L of oxygen. She was also started on Unasyn for concern of aspiration pneumonia.  3/12: Vital stable, saturating 99% on 3 L of oxygen-we will try to wean.  Labs with leukocytosis at  11.4.  Significantly elevated CBG above 300, patient has an history of prediabetes with most recent A1c of 6.8 on 2/24.  Added moderate SSI and 3 units with meal. Patient is currently on steroid.  Might get benefit from starting on p.o. meds on discharge.  3/13.  Patient feeling better.  Currently breathing comfortably on room air.  She states that she always has a little short of breath but a lot better than when she came in.   Assessment and Plan: * Acute asthma exacerbation Patient on room air.  Patient not wheezing today.  Will send home on prednisone.  Patient has inhalers and nebulizers at home.   Aspiration pneumonia ([place]) - She has a procalcitonin of 0.44 and lactic acid of 2.5 initially that was later down to 1.2.  Preliminary blood cultures negative. -Imaging was negative for pneumonia even though she was given Unasyn.  I will finish a 5-day course with Augmentin for few days upon discharge.  Acute respiratory failure with hypoxia (HCC) Required BiPAP initially for respiratory distress.  This has resolved and patient breathing comfortably on room air.  Essential hypertension - We will continue Cartia XT.  Restless leg syndrome - We will continue ropinirole.  Anxiety - We will continue her Klonopin and Cymbalta.  Uncontrolled type 2 diabetes mellitus with hyperglycemia, without long-term current use of insulin (HCC) Hyperglycemia secondary to steroids.  Hemoglobin A1c 6.8 making her diabetic.  Low carbohydrate diet discussed.  Patient's sugars may be a little bit higher for the next  2 days while on steroids.         Consultants: None Procedures performed: None Disposition: Home Diet recommendation:  Cardiac and Carb modified diet DISCHARGE MEDICATION: Allergies as of 06/03/2022       Reactions   Librium [chlordiazepoxide] Shortness Of Breath   Metoclopramide Hives, Other (See Comments)   hallucinations   Vanilla Shortness Of Breath   Pt reports allergy to  vanilla extract only   Aspirin Hives   Nsaids Rash   Rash/flares asthma issues.   Azithromycin Other (See Comments)   Unsure of what the reaction was.   Buprenorphine Hcl Other (See Comments)   Unsure of what the reaction was.   Chlordiazepoxide Hcl Other (See Comments)   unsure   Morphine Other (See Comments)   Unsure of reaction   Sulfa Antibiotics    Other reaction(s): Unknown   Tolmetin    Other Reaction: Allergy   Amlodipine Besylate Itching, Rash   arms, stomach and forehead   Iron Nausea And Vomiting   Other reaction(s): Unknown        Medication List     STOP taking these medications    acetaminophen-codeine 300-30 MG tablet Commonly known as: TYLENOL #3       TAKE these medications    amoxicillin-clavulanate 875-125 MG tablet Commonly known as: AUGMENTIN Take 1 tablet by mouth 2 (two) times daily for 3 days.   benzonatate 100 MG capsule Commonly known as: TESSALON Take 1 capsule (100 mg total) by mouth 2 (two) times daily as needed for cough. Home med.   busPIRone 10 MG tablet Commonly known as: BUSPAR Take 1 tablet (10 mg total) by mouth 2 (two) times daily.   clonazePAM 1 MG tablet Commonly known as: KLONOPIN Take 0.5 tablets (0.5 mg total) by mouth 2 (two) times daily as needed for anxiety.   Combivent Respimat 20-100 MCG/ACT Aers respimat Generic drug: Ipratropium-Albuterol INHALE 1 PUFF INTO THE LUNGS EVERY 6 HOURS   ipratropium-albuterol 0.5-2.5 (3) MG/3ML Soln Commonly known as: DUONEB Take 3 mLs by nebulization every 4 (four) hours as needed (for shortness of breath/wheezing).   diclofenac Sodium 1 % Gel Commonly known as: VOLTAREN Apply 4 g topically 4 (four) times daily.   diltiazem 240 MG 24 hr capsule Commonly known as: Cartia XT Take 1 capsule (240 mg total) by mouth daily.   DULoxetine 60 MG capsule Commonly known as: CYMBALTA Take 1 capsule (60 mg total) by mouth at bedtime.   EPINEPHrine 0.3 mg/0.3 mL Soaj  injection Commonly known as: EPI-PEN Inject 0.3 mg into the muscle as needed for anaphylaxis.   fluticasone 50 MCG/ACT nasal spray Commonly known as: FLONASE Place 2 sprays into both nostrils daily as needed for rhinitis or allergies. Home med.   fluticasone furoate-vilanterol 100-25 MCG/ACT Aepb Commonly known as: BREO ELLIPTA Inhale 1 puff into the lungs daily.   gabapentin 600 MG tablet Commonly known as: NEURONTIN Take 1 tablet (600 mg total) by mouth 2 (two) times daily.   hydrOXYzine 25 MG tablet Commonly known as: ATARAX Take 1-2 tablets (25-50 mg total) by mouth every 6 (six) hours as needed for itching.   loratadine 10 MG tablet Commonly known as: CLARITIN Take 1 tablet (10 mg total) by mouth daily as needed for allergies, rhinitis or itching. Home med.   montelukast 10 MG tablet Commonly known as: SINGULAIR Take 1 tablet (10 mg total) by mouth at bedtime.   ondansetron 4 MG tablet Commonly known as: ZOFRAN TAKE 1 TABLET  BY MOUTH TWICE DAILY AS NEEDED   pantoprazole 40 MG tablet Commonly known as: PROTONIX Take 40 mg by mouth 2 (two) times daily.   pilocarpine 5 MG tablet Commonly known as: SALAGEN Take 5 mg by mouth 2 (two) times daily.   predniSONE 20 MG tablet Commonly known as: DELTASONE Take 2 tablets (40 mg total) by mouth daily with breakfast for 2 days. Start taking on: June 04, 2022 What changed:  medication strength how much to take how to take this when to take this additional instructions   rOPINIRole 0.5 MG tablet Commonly known as: REQUIP TAKE 2 TABLETS BY MOUTH TWICE DAILY FOR RESTLESS LEGS   sennosides-docusate sodium 8.6-50 MG tablet Commonly known as: SENOKOT-S Take 2 tablets by mouth daily as needed for constipation. Home med   SUMAtriptan 100 MG tablet Commonly known as: IMITREX Take 1 tablet (100 mg total) by mouth every 2 (two) hours as needed (for migraine headaches.). May repeat in 1 hours if headache persists or recurs.    traZODone 150 MG tablet Commonly known as: DESYREL TAKE 1 TABLET(150 MG) BY MOUTH AT BEDTIME        Follow-up Information     Abernathy, Alyssa, NP Follow up in 5 day(s).   Specialty: Nurse Practitioner Why: 06/09/2022 10:00 AM Contact information: [place] [id number] [phone]                Discharge Exam: Filed Weights   06/01/22 1740  Weight: 75 kg   Physical Exam HENT:     Head: Normocephalic.     Mouth/Throat:     Pharynx: No oropharyngeal exudate.  Eyes:     General: Lids are normal.     Conjunctiva/sclera: Conjunctivae normal.  Cardiovascular:     Rate and Rhythm: Normal rate and regular rhythm.     Heart sounds: Normal heart sounds, S1 normal and S2 normal.  Abdominal:     Palpations: Abdomen is soft.     Tenderness: There is no abdominal tenderness.  Musculoskeletal:     Right lower leg: No swelling.     Left lower leg: No swelling.  Skin:    General: Skin is warm.     Findings: No rash.  Neurological:     Mental Status: She is alert and oriented to person, place, and time.      Condition at discharge: stable  The results of significant diagnostics from this hospitalization (including imaging, microbiology, ancillary and laboratory) are listed below for reference.   Imaging Studies: DG Chest Portable 1 View  Result Date: 06/01/2022 CLINICAL DATA:  Shortness of breath EXAM: PORTABLE CHEST 1 VIEW COMPARISON:  05/25/2022, chest CT 05/12/2022 FINDINGS: No acute airspace disease or effusion. Stable cardiomediastinal silhouette with moderate hiatal hernia. Surgical clips in the epigastric region. No pneumothorax. Rounded calcific opacities overlying the lower lungs appears to correspond to subcutaneous soft tissue calcification on prior chest CT. IMPRESSION: No active disease. Electronically Signed   By: Donavan Foil M.D.   On: 06/01/2022 18:41   DG Chest 2 View  Result Date: 05/25/2022 CLINICAL DATA:  67 year old female with  pneumonia EXAM: CHEST - 2 VIEW COMPARISON:  05/12/2022 FINDINGS: Cardiomediastinal silhouette unchanged in size and contour. Double density projects over the lower mediastinum is unchanged. Surgical clips in the epigastric region. Low lung volumes persist. Coarsened interstitial markings. No confluent airspace disease, pneumothorax, pleural effusion. Degenerative changes of the spine IMPRESSION: Negative for acute cardiopulmonary disease Electronically Signed   By: York Cerise  [name] D.O.   On: 05/25/2022 13:56   CT Angio Chest PE W and/or Wo Contrast  Result Date: 05/12/2022 CLINICAL DATA:  Short of breath, respiratory distress EXAM: CT ANGIOGRAPHY CHEST WITH CONTRAST TECHNIQUE: Multidetector CT imaging of the chest was performed using the standard protocol during bolus administration of intravenous contrast. Multiplanar CT image reconstructions and MIPs were obtained to evaluate the vascular anatomy. RADIATION DOSE REDUCTION: This exam was performed according to the departmental dose-optimization program which includes automated exposure control, adjustment of the mA and/or kV according to patient size and/or use of iterative reconstruction technique. CONTRAST:  62m OMNIPAQUE IOHEXOL 350 MG/ML SOLN COMPARISON:  05/12/2022, 05/11/2022 FINDINGS: Cardiovascular: This is a technically adequate evaluation of the pulmonary vasculature. No filling defects or pulmonary emboli. The heart is unremarkable without pericardial effusion. No evidence of thoracic aortic aneurysm or dissection. Mediastinum/Nodes: No enlarged mediastinal, hilar, or axillary lymph nodes. Thyroid gland, trachea, and esophagus demonstrate no significant findings. Small hiatal hernia. Lungs/Pleura: Evaluation limited by respiratory motion. Faint ground-glass opacities are again seen within the upper lobes, with slight improvement since the previous exam. No new airspace disease, effusion, or pneumothorax. Central airways are patent. Upper Abdomen: No  acute abnormality. Musculoskeletal: No acute or destructive bony lesions. Reconstructed images demonstrate no additional findings. Review of the MIP images confirms the above findings. IMPRESSION: 1. No evidence of pulmonary embolus. 2. Faint upper lobe ground-glass airspace disease consistent with residual infection. No significant change since the exam performed yesterday, but moderate improvement since the 04/24/2022 exam. 3. Hiatal hernia. Electronically Signed   By: MRanda NgoM.D.   On: 05/12/2022 21:31   DG Chest Portable 1 View  Result Date: 05/12/2022 CLINICAL DATA:  Short of breath, respiratory distress EXAM: PORTABLE CHEST 1 VIEW COMPARISON:  04/24/2022, 05/11/2022 FINDINGS: Single frontal view of the chest demonstrates a stable cardiac silhouette. There is increased interstitial prominence throughout the lungs, with patchy bilateral perihilar ground-glass airspace disease. No effusion or pneumothorax. Stable hiatal hernia. No acute bony abnormalities. IMPRESSION: 1. Interstitial and perihilar ground-glass opacities consistent with atypical viral pneumonia, including etiologies such as COVID-19. No significant change since CT performed yesterday. Electronically Signed   By: MRanda NgoM.D.   On: 05/12/2022 18:21   CT Chest Wo Contrast  Result Date: 05/11/2022 CLINICAL DATA:  67year old female with abnormal chest CTA on 04/24/2022 suspicious for multifocal pneumonia. Finished antibiotics. Respiratory illness. Worsening shortness of breath and chest tightness over the past 3 days. History of bilateral breast reduction. EXAM: CT CHEST WITHOUT CONTRAST TECHNIQUE: Multidetector CT imaging of the chest was performed following the standard protocol without IV contrast. RADIATION DOSE REDUCTION: This exam was performed according to the departmental dose-optimization program which includes automated exposure control, adjustment of the mA and/or kV according to patient size and/or use of iterative  reconstruction technique. COMPARISON:  Chest CTA 04/24/2022. FINDINGS: Cardiovascular: No cardiomegaly or pericardial effusion. No significant thoracic aortic calcified atherosclerosis. Vascular patency is not evaluated in the absence of IV contrast. Mediastinum/Nodes: Postoperative changes surrounding moderate sized gastric hiatal hernia this is stable from earlier this month. No superimposed mediastinal mass or lymphadenopathy. Lungs/Pleura: Mild respiratory motion today slightly larger lung volumes. Major airways are patent. There is up to mild bilateral perihilar bronchial wall thickening. Right upper lobe, middle lobe and lower lobe regressed peribronchial opacity, although not fully resolved. Residual peribronchial sub solid ground-glass opacity now in the previously affected areas including a 2 cm area of the peripheral right upper lobe on series 3  image 26. See also image 33 in the upper lobe, image 48 near the hilum, image 71 in the middle lobe. No right pleural effusion. Contralateral much less pronounced lower lobe peribronchial opacity has also regressed with occasional small areas of ground-glass residual (series 3, image 68 in the left lower lobe). However, a vague 3 cm area of peripheral left upper lobe ground-glass opacity has increased since the prior on image 37. No left pleural effusion Upper Abdomen: Numerous additional surgical clips along the esophageal hiatus and stomach, gastrosplenic ligament. Partially visible cholecystectomy clips also. Negative visible noncontrast liver, spleen, adrenal glands, large and small bowel loops. Fatty atrophy pancreas peer right upper pole nephrolithiasis measuring 4 mm. No free air or free fluid in the visible upper abdomen. Musculoskeletal: Advanced thoracic disc and endplate degeneration with partially visible T12-L1 interbody ankylosis. No acute or suspicious osseous lesion identified. Stable postoperative changes to the breast soft tissues with areas of  coarse, likely postoperative dystrophic calcification (on the right series 2, image 65). IMPRESSION: 1. Regressed bilateral multilobar Bronchopneumonia since 04/24/2022. Residual scattered ground-glass opacity, and a vague new 3 cm area of peripheral left upper lobe ground-glass opacity. No pleural effusion. Per Fleischner Society Guidelines, recommend a non-contrast Chest CT at 6 months to confirm persistence, then additional non-contrast Chest CTs every 2 years until 5 years. If nodule grows or develops solid component(s), consider resection. These guidelines do not apply to immunocompromised patients and patients with cancer. Follow up in patients with significant comorbidities as clinically warranted. For lung cancer screening, adhere to Lung-RADS guidelines. Reference: Radiology. 2017; 284(1):228-43. 2. Chronic postoperative changes associated with moderate gastric hiatal hernia and intra-abdominal stomach. Cholecystectomy. Right nephrolithiasis. Electronically Signed   By: Genevie Ann M.D.   On: 05/11/2022 10:49    Microbiology: Results for orders placed or performed during the hospital encounter of 06/01/22  Resp panel by RT-PCR (RSV, Flu A&B, Covid) Anterior Nasal Swab     Status: None   Collection Time: 06/01/22  5:56 PM   Specimen: Anterior Nasal Swab  Result Value Ref Range Status   SARS Coronavirus 2 by RT PCR NEGATIVE NEGATIVE Final    Comment: (NOTE) SARS-CoV-2 target nucleic acids are NOT DETECTED.  The SARS-CoV-2 RNA is generally detectable in upper respiratory specimens during the acute phase of infection. The lowest concentration of SARS-CoV-2 viral copies this assay can detect is 138 copies/mL. A negative result does not preclude SARS-Cov-2 infection and should not be used as the sole basis for treatment or other patient management decisions. A negative result may occur with  improper specimen collection/handling, submission of specimen other than nasopharyngeal swab, presence of  viral mutation(s) within the areas targeted by this assay, and inadequate number of viral copies(<138 copies/mL). A negative result must be combined with clinical observations, patient history, and epidemiological information. The expected result is Negative.  Fact Sheet for Patients:  EntrepreneurPulse.com.au  Fact Sheet for Healthcare Providers:  IncredibleEmployment.be  This test is no t yet approved or cleared by the [place] FDA and  has been authorized for detection and/or diagnosis of SARS-CoV-2 by FDA under an Emergency Use Authorization (EUA). This EUA will remain  in effect (meaning this test can be used) for the duration of the COVID-19 declaration under Section 564(b)(1) of the Act, 21 U.S.C.section 360bbb-3(b)(1), unless the authorization is terminated  or revoked sooner.       Influenza A by PCR NEGATIVE NEGATIVE Final   Influenza B by PCR NEGATIVE NEGATIVE Final  Comment: (NOTE) The Xpert Xpress SARS-CoV-2/FLU/RSV plus assay is intended as an aid in the diagnosis of influenza from Nasopharyngeal swab specimens and should not be used as a sole basis for treatment. Nasal washings and aspirates are unacceptable for Xpert Xpress SARS-CoV-2/FLU/RSV testing.  Fact Sheet for Patients: EntrepreneurPulse.com.au  Fact Sheet for Healthcare Providers: IncredibleEmployment.be  This test is not yet approved or cleared by the [place] FDA and has been authorized for detection and/or diagnosis of SARS-CoV-2 by FDA under an Emergency Use Authorization (EUA). This EUA will remain in effect (meaning this test can be used) for the duration of the COVID-19 declaration under Section 564(b)(1) of the Act, 21 U.S.C. section 360bbb-3(b)(1), unless the authorization is terminated or revoked.     Resp Syncytial Virus by PCR NEGATIVE NEGATIVE Final    Comment: (NOTE) Fact Sheet for  Patients: EntrepreneurPulse.com.au  Fact Sheet for Healthcare Providers: IncredibleEmployment.be  This test is not yet approved or cleared by the [place] FDA and has been authorized for detection and/or diagnosis of SARS-CoV-2 by FDA under an Emergency Use Authorization (EUA). This EUA will remain in effect (meaning this test can be used) for the duration of the COVID-19 declaration under Section 564(b)(1) of the Act, 21 U.S.C. section 360bbb-3(b)(1), unless the authorization is terminated or revoked.  Performed at [facility], [address] [place], [place] [address]   Blood culture (single)     Status: None (Preliminary result)   Collection Time: 06/01/22  8:54 PM   Specimen: BLOOD  Result Value Ref Range Status   Specimen Description BLOOD RIGHT FOREARM  Final   Special Requests   Final    BOTTLES DRAWN AEROBIC ONLY Blood Culture adequate volume   Culture   Final    NO GROWTH 2 DAYS Performed at [facility], [address] [place], [place] [address]    Report Status PENDING  Incomplete    Labs: CBC: Recent Labs  Lab 06/01/22 1756 06/02/22 0439  WBC 9.9 11.4*  NEUTROABS 7.2  --   HGB 12.0 11.6*  HCT 37.3 36.5  MCV 90.3 93.6  PLT 253 [id number]   Basic Metabolic Panel: Recent Labs  Lab 06/01/22 1756 06/02/22 0439  NA 134* 135  K 4.7 4.4  CL 100 103  CO2 24 19*  GLUCOSE 170* 342*  BUN 18 20  CREATININE 0.73 0.77  CALCIUM 9.0 8.2*   Liver Function Tests: Recent Labs  Lab 06/01/22 1756  AST 28  ALT 28  ALKPHOS 74  BILITOT 0.6  PROT 6.6  ALBUMIN 3.6   CBG: Recent Labs  Lab 06/02/22 1242 06/02/22 1632 06/02/22 2105 06/03/22 0748  GLUCAP 242* 252* 159* 135*    Discharge time spent: greater than 30 minutes.  Signed: Loletha Grayer, MD [facility] 06/03/2022

## 2022-06-03 NOTE — Progress Notes (Signed)
  Progress Note   Date: 06/03/2022  Patient Name: Summer Murphy        MRN#: [id number]  Clarification of the diagnosis of diabetes:   Diabetes mellitus Type I with hyperglycemia secondary to steroids.  Hemoglobin A1c 6.8

## 2022-06-03 NOTE — TOC Initial Note (Signed)
Transition of Care [facility]) - Initial/Assessment Note    Patient Details  Name: Summer Murphy MRN: [id number] Date of Birth: 11/20/1955  Transition of Care [facility]) CM/SW Contact:    Beverly Sessions, RN Phone Number: 06/03/2022, 9:28 AM  Clinical Narrative:                   Transition of Care (TOC) Screening Note   Patient Details  Name: Summer Murphy Date of Birth: 03-06-1956   Transition of Care [facility]) CM/SW Contact:    Beverly Sessions, RN Phone Number: 06/03/2022, 9:28 AM    Transition of Care Department [facility]) has reviewed patient and no TOC needs have been identified at this time. We will continue to monitor patient advancement through interdisciplinary progression rounds. If new patient transition needs arise, please place a TOC consult.         Patient Goals and CMS Choice            Expected Discharge Plan and Services         Expected Discharge Date: 06/03/22                                    Prior Living Arrangements/Services                       Activities of Daily Living Home Assistive Devices/Equipment: Nebulizer ADL Screening (condition at time of admission) Patient's cognitive ability adequate to safely complete daily activities?: Yes Is the patient deaf or have difficulty hearing?: No Does the patient have difficulty seeing, even when wearing glasses/contacts?: No Does the patient have difficulty concentrating, remembering, or making decisions?: No Patient able to express need for assistance with ADLs?: Yes Does the patient have difficulty dressing or bathing?: No Independently performs ADLs?: Yes (appropriate for developmental age) Does the patient have difficulty walking or climbing stairs?: No Weakness of Legs: None Weakness of Arms/Hands: None  Permission Sought/Granted                  Emotional Assessment              Admission diagnosis:  Mild intermittent asthma with exacerbation  [J45.21] Acute asthma exacerbation [J45.901] Acute on chronic respiratory failure with hypoxia (HCC) [J96.21] Patient Active Problem List   Diagnosis Date Noted   Aspiration pneumonia ([place]) 06/02/2022   Anxiety 06/02/2022   Essential hypertension 06/02/2022   Acute respiratory failure with hypoxia ([place]) 06/02/2022   Acute asthma exacerbation 06/01/2022   Dyspnea 05/13/2022   Multifocal pneumonia 05/12/2022   Hypertensive urgency 05/12/2022   Sepsis ([place]) 05/12/2022   Hyperglycemia 05/12/2022   CAP (community acquired pneumonia) 04/24/2022   PNA (pneumonia) 04/24/2022   Hypocalcemia 07/31/2021   Esophageal dysphagia    Benign esophageal stricture    S/P hardware removal 11/14/2020   IDA (iron deficiency anemia) 10/18/2020   Moderate persistent asthma with acute exacerbation 08/21/2020   S/P revision of total knee, right 11/14/2019   Seasonal allergic rhinitis due to pollen 09/20/2019   Callus of foot 08/13/2019   Candidal vaginitis 08/13/2019   Other symptoms and signs involving the nervous system 08/13/2019   Encounter for general adult medical examination with abnormal findings 08/13/2019   Restless leg syndrome 04/30/2019   Muscle cramps 0000000   Diastolic dysfunction [id number]   SOB (shortness of breath) 12/14/2018   Hiatal hernia with GERD 12/14/2018  Wheezing 12/14/2018   Impaired fasting glucose 12/14/2018   COPD with acute exacerbation ([place]) 09/28/2018   Acute upper respiratory infection 08/25/2018   Recurrent sinusitis 08/15/2018   Prediabetes 08/15/2018   Nonintractable headache 08/15/2018   Dysuria 08/15/2018   Intractable migraine without status migrainosus 04/22/2018   Nausea 04/22/2018   Polyneuropathy associated with underlying disease ([place]) 04/22/2018   Midline low back pain without sciatica 04/22/2018   Routine cervical smear 04/22/2018   HTN (hypertension) 07/01/2017   Acute bronchitis with asthma 07/01/2017   Allergic rhinitis 07/01/2017    Moderate asthma without complication [id number]   Severe recurrent major depression without psychotic features ([place]) 09/10/2014   COPD (chronic obstructive pulmonary disease) ([place]) 09/10/2014   Calculi, ureter 04/26/2013   Corneal graft malfunction 08/17/2012   Calculus of kidney [id number]   Renal colic [id number]   Urge incontinence 04/26/2012   Diaphragmatic hernia 10/27/2010   Barrett esophagus 07/07/2010   PCP:  Jonetta Osgood, NP Pharmacy:   [facility], [place] - [address] [place] [address] Phone: [phone] Fax: [place], [place] - [address] [place] [address] Phone: [phone] Fax: [place] [phone] Lorina [facility]on, [place] - [address] [place] [address] Phone: [phone] Fax: [phone]     Social Determinants of Health ([place]) Social History: [place]: No Food Insecurity (06/02/2022)  Housing: Low Risk  (06/02/2022)  Transportation Needs: No Transportation Needs (06/02/2022)  Utilities: Not At Risk (06/02/2022)  Alcohol Screen: Low Risk  (12/31/2021)  Depression (PHQ2-9): Low Risk  (04/22/2022)  Tobacco Use: Low Risk  (06/02/2022)   SDOH Interventions: Food Insecurity Interventions: Intervention Not Indicated Transportation Interventions: Intervention Not Indicated Utilities Interventions: Intervention Not Indicated   Readmission Risk Interventions     No data to display

## 2022-06-05 ENCOUNTER — Other Ambulatory Visit: Payer: Self-pay | Admitting: Nurse Practitioner

## 2022-06-05 DIAGNOSIS — K449 Diaphragmatic hernia without obstruction or gangrene: Secondary | ICD-10-CM

## 2022-06-06 LAB — CULTURE, BLOOD (SINGLE)
Culture: NO GROWTH
Special Requests: ADEQUATE

## 2022-06-09 ENCOUNTER — Encounter: Payer: Self-pay | Admitting: Internal Medicine

## 2022-06-09 ENCOUNTER — Ambulatory Visit (INDEPENDENT_AMBULATORY_CARE_PROVIDER_SITE_OTHER): Payer: Self-pay | Admitting: Internal Medicine

## 2022-06-09 VITALS — BP 140/70 | HR 98 | Temp 97.8°F | Resp 16 | Ht 63.0 in | Wt 168.0 lb

## 2022-06-09 DIAGNOSIS — T380X5A Adverse effect of glucocorticoids and synthetic analogues, initial encounter: Secondary | ICD-10-CM

## 2022-06-09 DIAGNOSIS — K449 Diaphragmatic hernia without obstruction or gangrene: Secondary | ICD-10-CM | POA: Diagnosis not present

## 2022-06-09 DIAGNOSIS — Z09 Encounter for follow-up examination after completed treatment for conditions other than malignant neoplasm: Secondary | ICD-10-CM

## 2022-06-09 DIAGNOSIS — J4541 Moderate persistent asthma with (acute) exacerbation: Secondary | ICD-10-CM

## 2022-06-09 DIAGNOSIS — R739 Hyperglycemia, unspecified: Secondary | ICD-10-CM | POA: Diagnosis not present

## 2022-06-09 LAB — GLUCOSE, POCT (MANUAL RESULT ENTRY): POC Glucose: 137 mg/dl — AB (ref 70–99)

## 2022-06-09 MED ORDER — ACCU-CHEK SOFTCLIX LANCETS MISC: 12 refills | Status: DC

## 2022-06-09 MED ORDER — LANCETS MISC. MISC: 0 refills | Status: DC

## 2022-06-09 MED ORDER — BLOOD GLUCOSE MONITORING SUPPL DEVI: 1.0000 | Freq: Three times a day (TID) | 0 refills | Status: DC

## 2022-06-09 MED ORDER — ACCU-CHEK GUIDE VI STRP: ORAL_STRIP | 12 refills | Status: DC

## 2022-06-09 MED ORDER — ACCU-CHEK GUIDE ME W/DEVICE KIT: PACK | 0 refills | Status: DC

## 2022-06-09 MED ORDER — LANCET DEVICE MISC: 0 refills | Status: DC

## 2022-06-09 MED ORDER — BLOOD GLUCOSE TEST VI STRP: ORAL_STRIP | 0 refills | Status: DC

## 2022-06-09 NOTE — Progress Notes (Signed)
[facility] [address] [place], [place] [address]  Internal MEDICINE                                             Transitional care Management   Date of admission 06/01/2022  Date of discharge 06/03/2022   Office Visit Note 06/09/2022  Patient Name: Summer Murphy  [id number]  [id number]  Date of Service: 06/22/2022  Chief Complaint  Patient presents with   Hospitalization Follow-up   Hypertension   Gastroesophageal Reflux    HPI Hospital records are reviewed as followed Her main concern is elevated glucose, she does not a glucometer at home, breathing is improved,there is question of aspiration, she will be seen by GI/surgery for possible hiatal her hernia  JOHNNITA Murphy is a 67 y.o. Caucasian female with medical history significant for asthma, GERD, hypertension, migraine, prediabetes, restless leg syndrome and anxiety, who presented to the emergency room with complain of worsening dyspnea,  associated productive cough and wheezing over the last couple of days .   ED Course: When she came to the ER BP was 162/144 with heart rate of 102 and respiratory to 25 with an otherwise normal vital signs.  Labs revealed an ABG with pH 7.57 and HCO3 of 27.5 with pO2 of 121 and pCO2 of 30 and O2 sat of 97.3%.  CMP revealed mild hyponatremia blood glucose of 170 with otherwise unremarkable levels.  BNP was 22.8.  Lactic was 2.5 and later was 1.2 and procalcitonin 0.44.  CBC was within normal.  Influenza, COVID-19 and RSV PCR's came back negative.  Blood cultures were drawn.   EKG : Sinus tachycardia with rate of 103 with PVCs. Imaging: Portable chest ray showed no acute cardiopulmonary disease.   Patient received 3 DuoNeb treatment, 2 g of IV magnesium sulfate and 125 mg of IV Solu-Medrol.  Initially placed on BiPAP and then later transitioned to 3 L of oxygen. She was also started on Unasyn for concern of aspiration pneumonia.   3/12: Vital stable, saturating 99% on 3 L of  oxygen-we will try to wean.  Labs with leukocytosis at 11.4.  Significantly elevated CBG above 300, patient has an history of prediabetes with most recent A1c of 6.8 on 2/24.  Added moderate SSI and 3 units with meal. Patient is currently on steroid.  Might get benefit from starting on p.o. meds on discharge.   3/13.  Patient feeling better.  Currently breathing comfortably on room air.  She states that she always has a little short of breath but a lot better than when she came in.   Current Medication: Outpatient Encounter Medications as of 06/09/2022  Medication Sig   Blood Glucose Monitoring Suppl (ACCU-CHEK GUIDE ME) w/Device KIT Use as directed once a day DX R730.01   busPIRone (BUSPAR) 10 MG tablet Take 1 tablet (10 mg total) by mouth 2 (two) times daily.   clonazePAM (KLONOPIN) 1 MG tablet Take 0.5 tablets (0.5 mg total) by mouth 2 (two) times daily as needed for anxiety.   diclofenac Sodium (VOLTAREN) 1 % GEL Apply 4 g topically 4 (four) times daily.   diltiazem (CARTIA XT) 240 MG 24 hr capsule Take 1 capsule (240 mg total) by mouth daily.   DULoxetine (CYMBALTA) 60 MG capsule Take 1 capsule (60 mg total) by mouth at bedtime.   EPINEPHrine 0.3 mg/0.3 mL  IJ SOAJ injection Inject 0.3 mg into the muscle as needed for anaphylaxis.   fluticasone (FLONASE) 50 MCG/ACT nasal spray Place 2 sprays into both nostrils daily as needed for rhinitis or allergies. Home med.   fluticasone furoate-vilanterol (BREO ELLIPTA) 100-25 MCG/ACT AEPB Inhale 1 puff into the lungs daily.   gabapentin (NEURONTIN) 600 MG tablet Take 1 tablet (600 mg total) by mouth 2 (two) times daily.   glucose blood (ACCU-CHEK GUIDE) test strip Use as instructed once daily Dx R73.01   Ipratropium-Albuterol (COMBIVENT RESPIMAT) 20-100 MCG/ACT AERS respimat INHALE 1 PUFF INTO THE LUNGS EVERY 6 HOURS   ipratropium-albuterol (DUONEB) 0.5-2.5 (3) MG/3ML SOLN Take 3 mLs by nebulization every 4 (four) hours as needed (for shortness of  breath/wheezing).   montelukast (SINGULAIR) 10 MG tablet Take 1 tablet (10 mg total) by mouth at bedtime.   ondansetron (ZOFRAN) 4 MG tablet TAKE 1 TABLET BY MOUTH TWICE DAILY AS NEEDED   pantoprazole (PROTONIX) 40 MG tablet Take 40 mg by mouth 2 (two) times daily.   pilocarpine (SALAGEN) 5 MG tablet Take 5 mg by mouth 2 (two) times daily.   rOPINIRole (REQUIP) 0.5 MG tablet TAKE 2 TABLETS BY MOUTH TWICE DAILY FOR RESTLESS LEGS   sennosides-docusate sodium (SENOKOT-S) 8.6-50 MG tablet Take 2 tablets by mouth daily as needed for constipation. Home med   SUMAtriptan (IMITREX) 100 MG tablet Take 1 tablet (100 mg total) by mouth every 2 (two) hours as needed (for migraine headaches.). May repeat in 1 hours if headache persists or recurs.   traZODone (DESYREL) 150 MG tablet TAKE 1 TABLET(150 MG) BY MOUTH AT BEDTIME   [DISCONTINUED] Accu-Chek Softclix Lancets lancets Use as instructed once a daily DX R730.01   [DISCONTINUED] benzonatate (TESSALON) 100 MG capsule Take 1 capsule (100 mg total) by mouth 2 (two) times daily as needed for cough. Home med. (Patient not taking: Reported on 06/12/2022)   [DISCONTINUED] Blood Glucose Monitoring Suppl DEVI 1 each by Does not apply route in the morning, at noon, and at bedtime. May substitute to any manufacturer covered by patient's insurance.   [DISCONTINUED] Glucose Blood (BLOOD GLUCOSE TEST STRIPS) STRP check blood sugar once a day   [DISCONTINUED] hydrOXYzine (ATARAX) 25 MG tablet Take 1-2 tablets (25-50 mg total) by mouth every 6 (six) hours as needed for itching.   [DISCONTINUED] Lancet Device MISC Use QD   [DISCONTINUED] Lancets Misc. MISC Check blood sugar once aday for DM   [DISCONTINUED] loratadine (CLARITIN) 10 MG tablet Take 1 tablet (10 mg total) by mouth daily as needed for allergies, rhinitis or itching. Home med.   Accu-Chek Softclix Lancets lancets Use as instructed once a daily DX R730.01   No facility-administered encounter medications on file as  of 06/09/2022.    Surgical History: Past Surgical History:  Procedure Laterality Date   ABDOMINAL HYSTERECTOMY     AMPUTATION TOE Left 07/31/2016   Procedure: AMPUTATION TOE/MPJ 2nd toe;  Surgeon: Sharlotte Alamo, DPM;  Location: [facility] ORS;  Service: Podiatry;  Laterality: Left;   APPENDECTOMY  1990   BACK SURGERY     low back   BREAST SURGERY     bilateral breast reduction   CARDIAC ELECTROPHYSIOLOGY STUDY AND ABLATION     CHOLECYSTECTOMY  1990   COLONOSCOPY WITH PROPOFOL N/A 01/04/2017   Procedure: COLONOSCOPY WITH PROPOFOL;  Surgeon: Manya Silvas, MD;  Location: [facility] ENDOSCOPY;  Service: Endoscopy;  Laterality: N/A;   CORNEAL TRANSPLANT     ESOPHAGOGASTRODUODENOSCOPY N/A 04/09/2021   Procedure:  ESOPHAGOGASTRODUODENOSCOPY (EGD);  Surgeon: Lin Landsman, MD;  Location: [facility] ENDOSCOPY;  Service: Gastroenterology;  Laterality: N/A;   ESOPHAGOGASTRODUODENOSCOPY (EGD) WITH PROPOFOL N/A 01/04/2017   Procedure: ESOPHAGOGASTRODUODENOSCOPY (EGD) WITH PROPOFOL;  Surgeon: Manya Silvas, MD;  [facility] ENDOSCOPY;  Service: Endoscopy;  Laterality: N/A;   EXCISION BONE CYST Left 07/31/2016   Procedure: EXCISION BONE CYST/exostectomy 28124/left 2nd;  Surgeon: Sharlotte Alamo, DPM;  Location: [facility] ORS;  Service: Podiatry;  Laterality: Left;   EXTRACORPOREAL SHOCK WAVE LITHOTRIPSY Left 09/12/2015   Procedure: EXTRACORPOREAL SHOCK WAVE LITHOTRIPSY (ESWL);  Surgeon: Hollice Espy, MD;  Location: [facility] ORS;  Service: Urology;  Laterality: Left;   FRACTURE SURGERY     left foot   HARDWARE REMOVAL Left 11/14/2020   Procedure: HARDWARE REMOVAL;  Surgeon: Hessie Knows, MD;  Location: [facility] ORS;  Service: Orthopedics;  Laterality: Left;   [place] repair     Fundoplication   JOINT REPLACEMENT Bilateral 2013,2014   total knees   LAPAROSCOPIC HYSTERECTOMY     LITHOTRIPSY     periprosthetic supracondylar fracture of left femur  02/16/2020   [facility]   TONSILLECTOMY     TOTAL KNEE REVISION  Right 11/14/2019   Procedure: Revision patella and tibial polyethylene;  Surgeon: Hessie Knows, MD;  Location: [facility] ORS;  Service: Orthopedics;  Laterality: Right;   URETEROSCOPY      Medical History: Past Medical History:  Diagnosis Date   Acid reflux    Anemia    Anxiety    Arrhythmia    treated with meds and has no current problems   Arthritis    most uncomfortable in knees   Asthma    uses several inhalers   Depression    Fever blister    Hematuria    History of kidney stones    Hypertension    Hypoglycemia    Left flank pain    Migraine    Pre-diabetes    Restless leg    Yeast vaginitis     Family History: Family History  Problem Relation Age of Onset   Hypertension Mother    Stroke Mother    Prostate cancer Father    Kidney Stones Father    Diabetes Brother    Hypertension Brother    Breast cancer Maternal Aunt    Kidney disease Neg Hx     Social History   Socioeconomic History   Marital status: Widowed    Spouse name: Not on file   Number of children: Not on file   Years of education: Not on file   Highest education level: Not on file  Occupational History   Not on file  Tobacco Use   Smoking status: Never    Passive exposure: Past   Smokeless tobacco: Never  Vaping Use   Vaping Use: Never used  Substance and Sexual Activity   Alcohol use: No   Drug use: No   Sexual activity: Not on file  Other Topics Concern   Not on file  Social History Narrative   Not on file   Social Determinants of Health   Financial Resource Strain: Not on file  Food Insecurity: No Food Insecurity (06/02/2022)   Hunger Vital Sign    Worried About Running Out of Food in the Last Year: Never true    Ran Out of Food in the Last Year: Never true  Transportation Needs: No Transportation Needs (06/02/2022)   PRAPARE - Transportation    Lack of Transportation (Medical): No    Lack of  Transportation (Non-Medical): No  Physical Activity: Not on file  Stress: Not on  file  Social Connections: Not on file  Intimate Partner Violence: Not At Risk (06/02/2022)   Humiliation, Afraid, Rape, and Kick questionnaire    Fear of Current or Ex-Partner: No    Emotionally Abused: No    Physically Abused: No    Sexually Abused: No      Review of Systems  Constitutional:  Negative for fatigue and fever.  HENT:  Negative for congestion, mouth sores and postnasal drip.   Respiratory:  Negative for cough.   Cardiovascular:  Negative for chest pain.  Genitourinary:  Negative for flank pain.  Psychiatric/Behavioral: Negative.      Vital Signs: BP (!) 140/70   Pulse 98   Temp 97.8 F (36.6 C)   Resp 16   Ht 5\' 3"  (1.6 m)   Wt 168 lb (76.2 kg)   SpO2 96%   BMI 29.76 kg/m    Physical Exam Constitutional:      Appearance: Normal appearance.  HENT:     Head: Normocephalic and atraumatic.     Nose: Nose normal.     Mouth/Throat:     Mouth: Mucous membranes are moist.     Pharynx: No posterior oropharyngeal erythema.  Eyes:     Extraocular Movements: Extraocular movements intact.     Pupils: Pupils are equal, round, and reactive to light.  Cardiovascular:     Pulses: Normal pulses.     Heart sounds: Normal heart sounds.  Pulmonary:     Effort: Pulmonary effort is normal.     Breath sounds: Normal breath sounds.  Neurological:     General: No focal deficit present.     Mental Status: She is alert.  Psychiatric:        Mood and Affect: Mood normal.        Behavior: Behavior normal.        Assessment/Plan: 1. Steroid-induced hyperglycemia Glucometer is prescribed to monitor glucose at home, avoid oral hypoglycemics at the moment  - Blood Glucose Monitoring Suppl (ACCU-CHEK GUIDE ME) w/Device KIT; Use as directed once a day DX R730.01  Dispense: 1 kit; Refill: 0 - glucose blood (ACCU-CHEK GUIDE) test strip; Use as instructed once daily Dx R73.01  Dispense: 100 each; Refill: 12 - POCT Glucose (CBG) - Accu-Chek Softclix Lancets lancets; Use as  instructed once a daily DX R730.01  Dispense: 100 each; Refill: 12  2. Large hiatal hernia Will need surgery, at risk for aspiration. HOB needs to be elevated at 45 degrees   3. Hospital discharge follow-up All notes and medications are reviewed   4. Moderate persistent asthma with acute exacerbation Continue inhalers as before    General Counseling: Lanice verbalizes understanding of the findings of todays visit and agrees with plan of treatment. I have discussed any further diagnostic evaluation that may be needed or ordered today. We also reviewed her medications today. she has been encouraged to call the office with any questions or concerns that should arise related to todays visit.    Orders Placed This Encounter  Procedures   POCT Glucose (CBG)    Meds ordered this encounter  Medications   DISCONTD: Blood Glucose Monitoring Suppl DEVI    Sig: 1 each by Does not apply route in the morning, at noon, and at bedtime. May substitute to any manufacturer covered by patient's insurance.    Dispense:  1 each    Refill:  0   DISCONTD: Glucose Blood (  BLOOD GLUCOSE TEST STRIPS) STRP    Sig: check blood sugar once a day    Dispense:  100 strip    Refill:  0   DISCONTD: Lancet Device MISC    Sig: Use QD    Dispense:  1 each    Refill:  0   DISCONTD: Lancets Misc. MISC    Sig: Check blood sugar once aday for DM    Dispense:  100 each    Refill:  0   DISCONTD: Accu-Chek Softclix Lancets lancets    Sig: Use as instructed once a daily DX R730.01    Dispense:  100 each    Refill:  12   Blood Glucose Monitoring Suppl (ACCU-CHEK GUIDE ME) w/Device KIT    Sig: Use as directed once a day DX R730.01    Dispense:  1 kit    Refill:  0   glucose blood (ACCU-CHEK GUIDE) test strip    Sig: Use as instructed once daily Dx R73.01    Dispense:  100 each    Refill:  12   Accu-Chek Softclix Lancets lancets    Sig: Use as instructed once a daily DX R730.01    Dispense:  100 each    Refill:   12    Total time spent:45 Minutes Time spent includes review of chart, medications, test results, and follow up plan with the patient.   [place] Controlled Substance Database was reviewed by me.   Dr Lavera Guise Internal medicine

## 2022-06-12 ENCOUNTER — Ambulatory Visit (INDEPENDENT_AMBULATORY_CARE_PROVIDER_SITE_OTHER): Payer: 59 | Admitting: Internal Medicine

## 2022-06-12 ENCOUNTER — Encounter: Payer: Self-pay | Admitting: Internal Medicine

## 2022-06-12 VITALS — BP 128/80 | HR 111 | Temp 98.5°F | Ht 63.0 in | Wt 168.2 lb

## 2022-06-12 DIAGNOSIS — J4489 Other specified chronic obstructive pulmonary disease: Secondary | ICD-10-CM

## 2022-06-12 DIAGNOSIS — R0602 Shortness of breath: Secondary | ICD-10-CM | POA: Diagnosis not present

## 2022-06-12 LAB — NITRIC OXIDE: Nitric Oxide: 27

## 2022-06-12 MED ORDER — BUDESONIDE 0.25 MG/2ML IN SUSP: 0.2500 mg | Freq: Two times a day (BID) | RESPIRATORY_TRACT | 12 refills | Status: DC

## 2022-06-12 MED ORDER — PREDNISONE 10 MG PO TABS: ORAL_TABLET | ORAL | 0 refills | Status: DC

## 2022-06-12 NOTE — Patient Instructions (Addendum)
Pantoprazole 40 mg Take 30- 60 min before your first and last meals of the day   GERD (REFLUX)  is an extremely common cause of respiratory symptoms just like yours , many times with no obvious heartburn at all.    It can be treated with medication, but also with lifestyle changes including elevation of the head of your bed (ideally with 6 -8inch blocks under the headboard of your bed),  Smoking cessation, avoidance of late meals, excessive alcohol, and avoid fatty foods, chocolate, peppermint, colas, red wine, and acidic juices such as orange juice.  NO MINT OR MENTHOL PRODUCTS SO NO COUGH DROPS  USE SUGARLESS CANDY INSTEAD (Jolley ranchers or Stover's or Life Savers) or even ice chips will also do - the key is to swallow to prevent all throat clearing. NO OIL BASED VITAMINS - use powdered substitutes.  Avoid fish oil when coughing.   Stop BREO for now   Duoneb 1st thing in am and 12 hours later add  budesonide one vial =  0.25 twice daily same bulb   Only use your albuterol as a rescue medication to be used if you can't catch your breath by resting or doing a relaxed purse lip breathing pattern.  - The less you use it, the better it will work when you need it. - Ok to use up to 1 puffs up   every 4 hours if you must but call for immediate appointment if use goes up over your usual need - Don't leave home without it !!  (think of it like the spare tire for your car)   If not improving > Prednisone 10 mg take  4 each am x 2 days,   2 each am x 2 days,  1 each am x 2 days and stop    For drainage / throat tickle try take CHLORPHENIRAMINE  4 mg  ("Allergy Relief" 4mg   at [facility] should be easiest to find in the blue box usually on bottom shelf)  take one every 4 hours as needed - extremely effective and inexpensive over the counter- may cause drowsiness so start with just a dose or two an hour before bedtime and see how you tolerate it before trying in daytime.   Please schedule a  follow up office visit in 6 weeks, call sooner if needed

## 2022-06-12 NOTE — Progress Notes (Signed)
Summer Murphy, female    DOB: 01/15/1956   MRN: [id number]   Brief patient profile:  77  yowf   never smoker  self referred to pulmonary clinic in [facility]  06/12/2022 for severe chronic asthma    Allergy shots x one month   > could not tolerate     Admit date:     06/01/2022  Discharge date: 06/03/22   Discharge Diagnoses: Principal Problem:   Acute asthma exacerbation   Aspiration pneumonia ([place])   Acute respiratory failure with hypoxia ([place])   Essential hypertension   Restless leg syndrome   Anxiety   Uncontrolled type 2 diabetes mellitus with hyperglycemia, without long-term current use of insulin [facility])   Hospital Course: Taken from H&P.   Summer Murphy is a 67 y.o. Caucasian female with medical history significant for asthma, GERD, hypertension, migraine, prediabetes, restless leg syndrome and anxiety, who presented to the emergency room with complain of worsening dyspnea,  associated productive cough and wheezing over the last couple of days .   ED Course: When she came to the ER BP was 162/144 with heart rate of 102 and respiratory to 25 with an otherwise normal vital signs.  Labs revealed an ABG with pH 7.57 and HCO3 of 27.5 with pO2 of 121 and pCO2 of 30 and O2 sat of 97.3%.  CMP revealed mild hyponatremia blood glucose of 170 with otherwise unremarkable levels.  BNP was 22.8.  Lactic was 2.5 and later was 1.2 and procalcitonin 0.44.  CBC was within normal.  Influenza, COVID-19 and RSV PCR's came back negative.  Blood cultures were drawn.   EKG : Sinus tachycardia with rate of 103 with PVCs. Imaging: Portable chest ray showed no acute cardiopulmonary disease.   Patient received 3 DuoNeb treatment, 2 g of IV magnesium sulfate and 125 mg of IV Solu-Medrol.  Initially placed on BiPAP and then later transitioned to 3 L of oxygen. She was also started on Unasyn for concern of aspiration pneumonia.   3/12: Vital stable, saturating 99% on 3 L of oxygen-we will try to  wean.  Labs with leukocytosis at 11.4.  Significantly elevated CBG above 300, patient has an history of prediabetes with most recent A1c of 6.8 on 2/24.  Added moderate SSI and 3 units with meal. Patient is currently on steroid.    3/13.  Patient feeling better.  Currently breathing comfortably on room air.  She states that she always has a little short of breath but a lot better than when she came in.     Assessment and Plan: * Acute asthma exacerbation Patient on room air.  Patient not wheezing today.  Will send home on prednisone.  Patient has inhalers and nebulizers at home.     Aspiration pneumonia ([place]) - She has a procalcitonin of 0.44 and lactic acid of 2.5 initially that was later down to 1.2.  Preliminary blood cultures negative. -Imaging was negative for pneumonia even though she was given Unasyn.  I will finish a 5-day course with Augmentin for few days upon discharge.   Acute respiratory failure with hypoxia (H[facility]) Required BiPAP initially for respiratory distress.  This has resolved and patient breathing comfortably on room air.   Essential hypertension - We will continue Cartia XT.   Restless leg syndrome - We will continue ropinirole.   Anxiety - We will continue her Klonopin and Cymbalta.   Uncontrolled type 2 diabetes mellitus with hyperglycemia, without long-term current use of insulin ([place])  Hyperglycemia secondary to steroids.  Hemoglobin A1c 6.8 making her diabetic.  Low carbohydrate diet discussed.     History of Present Illness  06/12/2022  Pulmonary/ 1st office eval/ Summer Murphy / [facility]  last prednisone 06/03/22 / maint on BREO Chief Complaint  Patient presents with   Consult    Has had asthma since she was a child. Dx with COPD. Has a hernia that causes her SOB. Wheezing. Dry cough.  ED- for Pneumonia in Feb and Asthma attack on 06/01/2022.   Dyspnea:  walk slow at grocery store stops every aisle = MMRC3 = can't walk 100 yards even at a slow pace at a  flat grade s stopping due to sob   Cough: nothing comes up/ globus sensation/ sometimes gags her / mostly p supper quiets down overnight min in am  Sleep: ok s noct inhalers  SABA use: combivent 4 x daily  No obvious day to day or daytime pattern/variability or assoc excess/ purulent sputum or mucus plugs or hemoptysis or cp or chest tightness, subjective wheeze or overt sinus or hb symptoms.   Sleeping  without nocturnal  or early am exacerbation  of respiratory  c/o's or need for noct saba. Also denies any obvious fluctuation of symptoms with weather or environmental changes or other aggravating or alleviating factors except as outlined above   No unusual exposure hx or h/o childhood pna/ asthma or knowledge of premature birth.  Current Allergies, Complete Past Medical History, Past Surgical History, Family History, and Social History were reviewed in [employer] record.  ROS  The following are not active complaints unless bolded Hoarseness, sore throat, dysphagia, dental problems, itching, sneezing,  nasal congestion or discharge of excess mucus or purulent secretions, ear ache,   fever, chills, sweats, unintended wt loss or wt gain, classically pleuritic or exertional cp,  orthopnea pnd or arm/hand swelling  or leg swelling, presyncope, palpitations, abdominal pain, anorexia, nausea, vomiting, diarrhea  or change in bowel habits or change in bladder habits, change in stools or change in urine, dysuria, hematuria,  rash, arthralgias, visual complaints, headache, numbness, weakness or ataxia or problems with walking or coordination,  change in mood or  memory.           Past Medical History:  Diagnosis Date   Acid reflux    Anemia    Anxiety    Arrhythmia    treated with meds and has no current problems   Arthritis    most uncomfortable in knees   Asthma    uses several inhalers   Depression    Fever blister    Hematuria    History of kidney stones     Hypertension    Hypoglycemia    Left flank pain    Migraine    Pre-diabetes    Restless leg    Yeast vaginitis     Outpatient Medications Prior to Visit  Medication Sig Dispense Refill   Accu-Chek Softclix Lancets lancets Use as instructed once a daily DX R730.01 100 each 12   Blood Glucose Monitoring Suppl (ACCU-CHEK GUIDE ME) w/Device KIT Use as directed once a day DX R730.01 1 kit 0   busPIRone (BUSPAR) 10 MG tablet Take 1 tablet (10 mg total) by mouth 2 (two) times daily. 60 tablet 2   clonazePAM (KLONOPIN) 1 MG tablet Take 0.5 tablets (0.5 mg total) by mouth 2 (two) times daily as needed for anxiety. 30 tablet 1   diclofenac Sodium (VOLTAREN) 1 %  GEL Apply 4 g topically 4 (four) times daily. 350 g 1   diltiazem (CARTIA XT) 240 MG 24 hr capsule Take 1 capsule (240 mg total) by mouth daily. 90 capsule 3   DULoxetine (CYMBALTA) 60 MG capsule Take 1 capsule (60 mg total) by mouth at bedtime. 90 capsule 1   EPINEPHrine 0.3 mg/0.3 mL IJ SOAJ injection Inject 0.3 mg into the muscle as needed for anaphylaxis. 1 each 3   fluticasone (FLONASE) 50 MCG/ACT nasal spray Place 2 sprays into both nostrils daily as needed for rhinitis or allergies. Home med.     fluticasone furoate-vilanterol (BREO ELLIPTA) 100-25 MCG/ACT AEPB Inhale 1 puff into the lungs daily. 1 each 11   gabapentin (NEURONTIN) 600 MG tablet Take 1 tablet (600 mg total) by mouth 2 (two) times daily. 60 tablet 3   glucose blood (ACCU-CHEK GUIDE) test strip Use as instructed once daily Dx R73.01 100 each 12   hydrOXYzine (ATARAX) 25 MG tablet Take 1-2 tablets (25-50 mg total) by mouth every 6 (six) hours as needed for itching. 30 tablet 0   Ipratropium-Albuterol (COMBIVENT RESPIMAT) 20-100 MCG/ACT AERS respimat INHALE 1 PUFF INTO THE LUNGS EVERY 6 HOURS 4 g 3   ipratropium-albuterol (DUONEB) 0.5-2.5 (3) MG/3ML SOLN Take 3 mLs by nebulization every 4 (four) hours as needed (for shortness of breath/wheezing). 120 mL 0   loratadine  (CLARITIN) 10 MG tablet Take 1 tablet (10 mg total) by mouth daily as needed for allergies, rhinitis or itching. Home med.     montelukast (SINGULAIR) 10 MG tablet Take 1 tablet (10 mg total) by mouth at bedtime. 90 tablet 1   ondansetron (ZOFRAN) 4 MG tablet TAKE 1 TABLET BY MOUTH TWICE DAILY AS NEEDED 45 tablet 1   pantoprazole (PROTONIX) 40 MG tablet Take 40 mg by mouth 2 (two) times daily.     pilocarpine (SALAGEN) 5 MG tablet Take 5 mg by mouth 2 (two) times daily.     rOPINIRole (REQUIP) 0.5 MG tablet TAKE 2 TABLETS BY MOUTH TWICE DAILY FOR RESTLESS LEGS 360 tablet 1   sennosides-docusate sodium (SENOKOT-S) 8.6-50 MG tablet Take 2 tablets by mouth daily as needed for constipation. Home med     SUMAtriptan (IMITREX) 100 MG tablet Take 1 tablet (100 mg total) by mouth every 2 (two) hours as needed (for migraine headaches.). May repeat in 1 hours if headache persists or recurs. 10 tablet 3   traZODone (DESYREL) 150 MG tablet TAKE 1 TABLET(150 MG) BY MOUTH AT BEDTIME 30 tablet 2   benzonatate (TESSALON) 100 MG capsule Take 1 capsule (100 mg total) by mouth 2 (two) times daily as needed for cough. Home med. (Patient not taking: Reported on 06/12/2022) 20 capsule 0   No facility-administered medications prior to visit.     Objective:     BP 128/80 (BP Location: Left Arm, Cuff Size: Normal)   Pulse (!) 111   Temp 98.5 F (36.9 C)   Ht 5\' 3"  (1.6 m)   Wt 168 lb 3.2 oz (76.3 kg)   SpO2 94%   BMI 29.80 kg/m   SpO2: 94 %  Amb wf nad/ somewhat unusual stature and movement   HEENT : Oropharynx  clear   Nasal turbinates nl    NECK :  without  apparent JVD/ palpable Nodes/TM    LUNGS: no acc muscle use,  Kypotic  contour chest wall with bilateral  slightly decreased bs s audible wheeze and  without cough on insp or exp maneuvers  and min  Hyperresonant  to  percussion bilaterally    CV:  RRR  no s3 or murmur or increase in P2, and no edema   ABD:  soft and nontender with pos end  insp  Hoover's  in the supine position.  No bruits or organomegaly appreciated   MS:  Nl gait/ ext warm without deformities Or obvious joint restrictions  calf tenderness, cyanosis or clubbing     SKIN: warm and dry without lesions    NEURO:  alert, approp, nl sensorium with  no motor or cerebellar deficits apparent.        I personally reviewed images and agree with radiology impression as follows:  CXR:   pa and lateral 05/25/22  Relatively low low vol/ mod T kyphosis, large HH   CTa  chest 05/12/22 no significant ILD or emphysema     Assessment   Chronic asthmatic bronchitis Never smoker - Spirometry  11/07/21  FEV1 1.41 (63%)  Ratio 0.69.5 mild concavity to f/v,  FRC 83% - FENO  06/12/2022  = 27  - 06/12/2022  After extensive coaching inhaler device,  effectiveness =  50% very short Ti) so rec change to duoneb bid with pulmicort 0.25 mg    DDX of  difficult airways management almost all start with A and  include Adherence, Ace Inhibitors, Acid Reflux, Active Sinus Disease, Alpha 1 Antitripsin deficiency, Anxiety masquerading as Airways dz,  ABPA,  Allergy(esp in young), Aspiration (esp in elderly), Adverse effects of meds,  Active smoking or vaping, A bunch of PE's (a small clot burden can't cause this syndrome unless there is already severe underlying pulm or vascular dz with poor reserve) plus two Bs  = Bronchiectasis and Beta blocker use..and one C= CHF  Adherence is always the initial "prime suspect" and is a multilayered concern that requires a "trust but verify" approach in every patient - starting with knowing how to use medications, especially inhalers, correctly, keeping up with refills and understanding the fundamental difference between maintenance and prns vs those medications only taken for a very short course and then stopped and not refilled.  - see hfa teaching  - return with all meds in hand using a trust but verify approach to confirm accurate Medication  Reconciliation The  principal here is that until we are certain that the  patients are doing what we've asked, it makes no sense to ask them to do more.   ? Acid (or non-acid) GERD > always difficult to exclude as up to 75% of pts in some series report no assoc GI/ Heartburn symptoms and she has a large HH > rec max (24h)  acid suppression and diet restrictions/ reviewed and instructions given in writing.   ? adverse effects of dpi with globus sensation> try off   Allergy / asthma > FENO is intermediate/ no Eos on recent diffs  and minimal airflow obstruction on last pft try continue singulair and and change to duoneb/bud to reduce upper airway component to her problem with 6 d prednisone as part of her written action plan and more approp use of saba: Re SABA :  I spent extra time with pt today reviewing appropriate use of albuterol for prn use on exertion with the following points: 1) saba is for relief of sob that does not improve by walking a slower pace or resting but rather if the pt does not improve after trying this first. 2) If the pt is convinced, as many are, that saba  helps recover from activity faster then it's easy to tell if this is the case by re-challenging : ie stop, take the inhaler, then p 5 minutes try the exact same activity (intensity of workload) that just caused the symptoms and see if they are substantially diminished or not after saba 3) if there is an activity that reproducibly causes the symptoms, try the saba 15 min before the activity on alternate days   If in fact the saba really does help, then fine to continue to use it prn but advised may need to look closer at the maintenance regimen being used to achieve better control of airways disease with exertion.   Active sinus dz/ rhinitis > try 1st gen H1 blockers per guidelines    ? A bunch of PEs > neg CTa   ? Anxiety > usually at the bottom of this list of usual suspects but  note already on psychotropics and may interfere with adherence  and also interpretation of response or lack thereof to symptom management which can be quite subjective> defer to PCP ? Does she have a slt chonic movement disorder or side effect from meds?  ? Chf  > echo [id number]   diastolic dysfunction with mild / mod LAE noted   F/u 6 weeks sooner if needed          Each maintenance medication was reviewed in detail including emphasizing most importantly the difference between maintenance and prns and under what circumstances the prns are to be triggered using an action plan format where appropriate.  Total time for H and P, chart review, counseling, reviewing hfa/smi/neb/dpi  device(s) and generating customized AVS unique to this office visit / same day charting = 60 min with new pt eval            Christinia Gully, MD 06/12/2022

## 2022-06-13 ENCOUNTER — Encounter: Payer: Self-pay | Admitting: Internal Medicine

## 2022-06-13 ENCOUNTER — Encounter: Payer: Self-pay | Admitting: Oncology

## 2022-06-13 NOTE — Assessment & Plan Note (Addendum)
Never smoker - Spirometry  11/07/21  FEV1 1.41 (63%)  Ratio 0.69.5 mild concavity to f/v,  FRC 83% - FENO  06/12/2022  = 27  - 06/12/2022  After extensive coaching inhaler device,  effectiveness =  50% very short Ti) so rec change to duoneb bid with pulmicort 0.25 mg    DDX of  difficult airways management almost all start with A and  include Adherence, Ace Inhibitors, Acid Reflux, Active Sinus Disease, Alpha 1 Antitripsin deficiency, Anxiety masquerading as Airways dz,  ABPA,  Allergy(esp in young), Aspiration (esp in elderly), Adverse effects of meds,  Active smoking or vaping, A bunch of PE's (a small clot burden can't cause this syndrome unless there is already severe underlying pulm or vascular dz with poor reserve) plus two Bs  = Bronchiectasis and Beta blocker use..and one C= CHF  Adherence is always the initial "prime suspect" and is a multilayered concern that requires a "trust but verify" approach in every patient - starting with knowing how to use medications, especially inhalers, correctly, keeping up with refills and understanding the fundamental difference between maintenance and prns vs those medications only taken for a very short course and then stopped and not refilled.  - see hfa teaching  - return with all meds in hand using a trust but verify approach to confirm accurate Medication  Reconciliation The principal here is that until we are certain that the  patients are doing what we've asked, it makes no sense to ask them to do more.   ? Acid (or non-acid) GERD > always difficult to exclude as up to 75% of pts in some series report no assoc GI/ Heartburn symptoms and she has a large HH > rec max (24h)  acid suppression and diet restrictions/ reviewed and instructions given in writing.   ? adverse effects of dpi with globus sensation> try off   Allergy / asthma > FENO is intermediate/ no Eos on recent diffs  and minimal airflow obstruction on last pft try continue singulair and and  change to duoneb/bud to reduce upper airway component to her problem with 6 d prednisone as part of her written action plan and more approp use of saba: Re SABA :  I spent extra time with pt today reviewing appropriate use of albuterol for prn use on exertion with the following points: 1) saba is for relief of sob that does not improve by walking a slower pace or resting but rather if the pt does not improve after trying this first. 2) If the pt is convinced, as many are, that saba helps recover from activity faster then it's easy to tell if this is the case by re-challenging : ie stop, take the inhaler, then p 5 minutes try the exact same activity (intensity of workload) that just caused the symptoms and see if they are substantially diminished or not after saba 3) if there is an activity that reproducibly causes the symptoms, try the saba 15 min before the activity on alternate days   If in fact the saba really does help, then fine to continue to use it prn but advised may need to look closer at the maintenance regimen being used to achieve better control of airways disease with exertion.   Active sinus dz/ rhinitis > try 1st gen H1 blockers per guidelines    ? A bunch of PEs > neg CTa   ? Anxiety > usually at the bottom of this list of usual suspects but  note  already on psychotropics and may interfere with adherence and also interpretation of response or lack thereof to symptom management which can be quite subjective> defer to PCP ? Does she have a slt chonic movement disorder or side effect from meds?  ? Chf  > echo [id number]   diastolic dysfunction with mild / mod LAE noted   F/u 6 weeks sooner if needed          Each maintenance medication was reviewed in detail including emphasizing most importantly the difference between maintenance and prns and under what circumstances the prns are to be triggered using an action plan format where appropriate.  Total time for H and P, chart review,  counseling, reviewing hfa/smi/neb/dpi  device(s) and generating customized AVS unique to this office visit / same day charting = 60 min with new pt eval

## 2022-06-15 ENCOUNTER — Encounter: Payer: Self-pay | Admitting: Surgery

## 2022-06-15 ENCOUNTER — Ambulatory Visit (INDEPENDENT_AMBULATORY_CARE_PROVIDER_SITE_OTHER): Payer: 59 | Admitting: Surgery

## 2022-06-15 ENCOUNTER — Telehealth: Payer: Self-pay | Admitting: Internal Medicine

## 2022-06-15 VITALS — BP 169/88 | HR 114 | Temp 98.4°F | Ht 63.0 in | Wt 166.0 lb

## 2022-06-15 DIAGNOSIS — K449 Diaphragmatic hernia without obstruction or gangrene: Secondary | ICD-10-CM | POA: Diagnosis not present

## 2022-06-15 DIAGNOSIS — R1310 Dysphagia, unspecified: Secondary | ICD-10-CM

## 2022-06-15 NOTE — Patient Instructions (Addendum)
A referral has been placed with Dr. Maryjane Hurter at [facility]. They will call you for an appointment.   Your Barium Swallow is scheduled for 06/17/2022 10 am (arrive by 9:45 am) at [facility]. Nothing to eat or drink after midnight.    If you have any concerns or questions, please feel free to call our office.    Hiatal Hernia   A hiatal hernia occurs when part of the stomach slides above the muscle that separates the abdomen from the chest (diaphragm). A person can be born with a hiatal hernia (congenital), or it may develop over time. In almost all cases of hiatal hernia, only the top part of the stomach pushes through the diaphragm. Many people have a hiatal hernia with no symptoms. The larger the hernia, the more likely it is that you will have symptoms. In some cases, a hiatal hernia allows stomach acid to flow back into the tube that carries food from your mouth to your stomach (esophagus). This may cause heartburn symptoms. The development of heartburn symptoms may mean that you have a condition called gastroesophageal reflux disease (GERD). What are the causes? This condition is caused by a weakness in the opening (hiatus) where the esophagus passes through the diaphragm to attach to the upper part of the stomach. A person may be born with a weakness in the hiatus, or a weakness can develop over time. What increases the risk? This condition is more likely to develop in: Older people. Age is a major risk factor for a hiatal hernia, especially if you are over the age of 52. Pregnant women. People who are overweight. People who have frequent constipation. What are the signs or symptoms? Symptoms of this condition usually develop in the form of GERD symptoms. Symptoms include: Heartburn. Upset stomach (indigestion). Trouble swallowing. Coughing or wheezing. Wheezing is making high-pitched whistling sounds when you breathe. Sore throat. Chest pain. Nausea and vomiting. How is this  diagnosed? This condition may be diagnosed during testing for GERD. Tests that may be done include: X-rays of your stomach or chest. An upper gastrointestinal (GI) series. This is an X-ray exam of your GI tract that is taken after you swallow a chalky liquid that shows up clearly on the X-ray. Endoscopy. This is a procedure to look into your stomach using a thin, flexible tube that has a tiny camera and light on the end of it. How is this treated? This condition may be treated by: Dietary and lifestyle changes to help reduce GERD symptoms. Medicines. These may include: Over-the-counter antacids. Medicines that make your stomach empty more quickly. Medicines that block the production of stomach acid (H2 blockers). Stronger medicines to reduce stomach acid (proton pump inhibitors). Surgery to repair the hernia, if other treatments are not helping. If you have no symptoms, you may not need treatment. Follow these instructions at home: Lifestyle and activity Do not use any products that contain nicotine or tobacco. These products include cigarettes, chewing tobacco, and vaping devices, such as e-cigarettes. If you need help quitting, ask your health care provider. Try to achieve and maintain a healthy body weight. Avoid putting pressure on your abdomen. Anything that puts pressure on your abdomen increases the amount of acid that may be pushed up into your esophagus. Avoid bending over, especially after eating. Raise the head of your bed by putting blocks under the legs. This keeps your head and esophagus higher than your stomach. Do not wear tight clothing around your chest or stomach. Try  not to strain when having a bowel movement, when urinating, or when lifting heavy objects. Eating and drinking Avoid foods that can worsen GERD symptoms. These may include: Fatty foods, like fried foods. Citrus fruits, like oranges or lemon. Other foods and drinks that contain acid, like orange juice or  tomatoes. Spicy food. Chocolate. Eat frequent small meals instead of three large meals a day. This helps prevent your stomach from getting too full. Eat slowly. Do not lie down right after eating. Do not eat 1-2 hours before bed. Do not drink beverages with caffeine. These include cola, coffee, cocoa, and tea. Do not drink alcohol. General instructions Take over-the-counter and prescription medicines only as told by your health care provider. Keep all follow-up visits. Your health care provider will want to check that any new prescribed medicines are helping your symptoms. Contact a health care provider if: Your symptoms are not controlled with medicines or lifestyle changes. You are having trouble swallowing. You have coughing or wheezing that will not go away. Your pain is getting worse. Your pain spreads to your arms, neck, jaw, teeth, or back. You feel nauseous or you vomit. Get help right away if: You have shortness of breath. You vomit blood. You have bright red blood in your stools. You have black, tarry stools. These symptoms may be an emergency. Get help right away. Call 911. Do not wait to see if the symptoms will go away. Do not drive yourself to the hospital. Summary A hiatal hernia occurs when part of the stomach slides above the muscle that separates the abdomen from the chest. A person may be born with a weakness in the hiatus, or a weakness can develop over time. Symptoms of a hiatal hernia may include heartburn, trouble swallowing, or sore throat. Management of a hiatal hernia includes eating frequent small meals instead of three large meals a day. Get help right away if you vomit blood, have bright red blood in your stools, or have black, tarry stools. This information is not intended to replace advice given to you by your health care provider. Make sure you discuss any questions you have with your health care provider. Document Revised: 05/06/2021 Document  Reviewed: 05/06/2021 Elsevier Patient Education  [place].

## 2022-06-15 NOTE — Telephone Encounter (Signed)
Called and spoke to patient.  She stated that she developed an asthma attack after an allergic reaction. She is not sure what she "got into". C/o wheezing, increased SOB and facial rash(she feels that this is related to allergic reaction). Denied f/c/s or additional sx. She has not started pulmicort yet, as pharmacy had to order med. She plans to pickup today.  Spo2 has dropped as low as 88% and is currently maintaining between 91-95% on roomair. No supplemental oxygen. She is using combivent QID and duoneb QID.  Dr. Melvyn Novas, please advise. Thanks

## 2022-06-15 NOTE — Telephone Encounter (Signed)
Spoke to patient and relayed below message. She stated that she had prednisone on hand and she started prednisone on Friday. She is now on 2 tablets for 2 days.  She will continue taper and call back if sx worsen. Nothing further needed.  Routing to Dr. Melvyn Novas has an [place].

## 2022-06-15 NOTE — Progress Notes (Addendum)
Surgical Consultation  06/15/2022  Summer Murphy is an 67 y.o. female.   Chief Complaint  Patient presents with   New Patient (Initial Visit)    Hiatal hernia     HPI: Summer Murphy is a 67 y.o. female sfollowing up for recurrent paraesophageal hernia.   PMH significant for asthma, COPD HTN, DM,depression, back pain, Barrett's esophagus s/p ablation, bulemia and GERD s/p nissen fundoplication x 2 who presents to clinic with heartburn. Seen at both [facility] and [facility] before, Prior history of Barrett's esophagus and low-grade dysplasia with history of ablation.   She did have EGD showing moderate paraesophageal H and stricture, it was dilated. She does have also history of gastroparesis as well  She is states that she had a Nissen fundoplication decades ago , 30 years or so I believe it was open.  SHe is unable to recall the details.  I have looked at the notes from Dr. Precious Bard 2017 and apparently she did had Nissen fundoplication's in an open fashion several decades ago. At  1 point in time she also had erosions at the GE junction associated with a hiatal hernia. Notes from 6 years ago from Dr. Precious Bard stated that they were evaluating for a potential subtotal gastrectomy. sHe endorses dyspnea on exertion and chronic cough as well as retrosternal pain and reflux. Most recently 6 weeks ago she underwent EGD with esophageal dilation by Dr. Marius Ditch.  She did have an stricture.  A large hiatal hernia was seen with an esophageal erosion.  Is not that I personally review endoscopic images.  He also had a gastric emptying study 2 years ago showing evidence of gastroparesis. She  Did have a CT scan few years ago that have personally reviewed showing evidence of paraesophageal hernia with postoperative changes in the mediastinum. Surgical abdominal history is extensive consistent with a hiatal hernia x2 in an open fashion, hysterectomy, cholecystectomy, appendectomy She completed a barium swallow as well as  a gastric emptying study that have personally reviewed.  She does have significant gastroparesis.  Also have some dysmotility on the large paraesophageal hernia with reflux.   More recently states that her insurance does not allow her to go back to [facility]    Past Medical History:  Diagnosis Date   Acid reflux    Anemia    Anxiety    Arrhythmia    treated with meds and has no current problems   Arthritis    most uncomfortable in knees   Asthma    uses several inhalers   Depression    Fever blister    Hematuria    History of kidney stones    Hypertension    Hypoglycemia    Left flank pain    Migraine    Pre-diabetes    Restless leg    Yeast vaginitis     Past Surgical History:  Procedure Laterality Date   ABDOMINAL HYSTERECTOMY     AMPUTATION TOE Left 07/31/2016   Procedure: AMPUTATION TOE/MPJ 2nd toe;  Surgeon: Sharlotte Alamo, DPM;  Location: [facility] ORS;  Service: Podiatry;  Laterality: Left;   APPENDECTOMY  1990   BACK SURGERY     low back   BREAST SURGERY     bilateral breast reduction   CARDIAC ELECTROPHYSIOLOGY STUDY AND ABLATION     CHOLECYSTECTOMY  1990   COLONOSCOPY WITH PROPOFOL N/A 01/04/2017   Procedure: COLONOSCOPY WITH PROPOFOL;  Surgeon: Manya Silvas, MD;  Location: [facility] ENDOSCOPY;  Service: Endoscopy;  Laterality: N/A;  CORNEAL TRANSPLANT     ESOPHAGOGASTRODUODENOSCOPY N/A 04/09/2021   Procedure: ESOPHAGOGASTRODUODENOSCOPY (EGD);  Surgeon: Lin Landsman, MD;  Location: [facility] ENDOSCOPY;  Service: Gastroenterology;  Laterality: N/A;   ESOPHAGOGASTRODUODENOSCOPY (EGD) WITH PROPOFOL N/A 01/04/2017   Procedure: ESOPHAGOGASTRODUODENOSCOPY (EGD) WITH PROPOFOL;  Surgeon: Manya Silvas, MD;  Location: [facility] ENDOSCOPY;  Service: Endoscopy;  Laterality: N/A;   EXCISION BONE CYST Left 07/31/2016   Procedure: EXCISION BONE CYST/exostectomy 28124/left 2nd;  Surgeon: Sharlotte Alamo, DPM;  Location: [facility] ORS;  Service: Podiatry;  Laterality: Left;   EXTRACORPOREAL  SHOCK WAVE LITHOTRIPSY Left 09/12/2015   Procedure: EXTRACORPOREAL SHOCK WAVE LITHOTRIPSY (ESWL);  Surgeon: Hollice Espy, MD;  Location: [facility] ORS;  Service: Urology;  Laterality: Left;   FRACTURE SURGERY     left foot   HARDWARE REMOVAL Left 11/14/2020   Procedure: HARDWARE REMOVAL;  Surgeon: Hessie Knows, MD;  Location: [facility] ORS;  Service: Orthopedics;  Laterality: Left;   [place] repair     Fundoplication   JOINT REPLACEMENT Bilateral 2013,2014   total knees   LAPAROSCOPIC HYSTERECTOMY     LITHOTRIPSY     periprosthetic supracondylar fracture of left femur  02/16/2020   [facility]   TONSILLECTOMY     TOTAL KNEE REVISION Right 11/14/2019   Procedure: Revision patella and tibial polyethylene;  Surgeon: Hessie Knows, MD;  Location: [facility] ORS;  Service: Orthopedics;  Laterality: Right;   URETEROSCOPY      Family History  Problem Relation Age of Onset   Hypertension Mother    Stroke Mother    Prostate cancer Father    Kidney Stones Father    Diabetes Brother    Hypertension Brother    Breast cancer Maternal Aunt    Kidney disease Neg Hx     Social History:  reports that she has never smoked. She has been exposed to tobacco smoke. She has never used smokeless tobacco. She reports that she does not drink alcohol and does not use drugs.  Allergies:  Allergies  Allergen Reactions   Librium [Chlordiazepoxide] Shortness Of Breath   Metoclopramide Hives and Other (See Comments)    hallucinations    Vanilla Shortness Of Breath    Pt reports allergy to vanilla extract only   Aspirin Hives   Nsaids Rash    Rash/flares asthma issues.   Azithromycin Other (See Comments)    Unsure of what the reaction was.   Buprenorphine Hcl Other (See Comments)    Unsure of what the reaction was.    Chlordiazepoxide Hcl Other (See Comments)    unsure   Morphine Other (See Comments)    Unsure of reaction   Sulfa Antibiotics     Other reaction(s): Unknown   Tolmetin     Other Reaction:  Allergy   Amlodipine Besylate Itching and Rash    arms, stomach and forehead   Iron Nausea And Vomiting    Other reaction(s): Unknown    Medications reviewed.     ROS Full ROS performed and is otherwise negative other than what is stated in the HPI    BP (!) 169/88   Pulse (!) 114   Temp 98.4 F (36.9 C) (Oral)   Ht 5\' 3"  (1.6 m)   Wt 166 lb (75.3 kg)   SpO2 95%   BMI 29.41 kg/m  PE  EYES: Pupils are equal, round,  Sclera are non-icteric. EARS, NOSE, MOUTH AND THROAT: sHe is wearing a mask hearing is intact to voice. LYMPH NODES:  Lymph nodes in the neck  are normal. RESPIRATORY:  Lungs are clear. There is normal respiratory effort, with equal breath sounds bilaterally, and without pathologic use of accessory muscles. CARDIOVASCULAR: Heart is regular without murmurs, gallops, or rubs. GI: The abdomen is  soft, nontender, and nondistended. He has several scars including a Mercedes scar,Indicating  a major foregut procedure there are no palpable masses. There is no hepatosplenomegaly. There are normal bowel sounds  GU: Rectal deferred.   MUSCULOSKELETAL: Normal muscle strength and tone. No cyanosis or edema.   SKIN: Turgor is good and there are no pathologic skin lesions or ulcers. NEUROLOGIC: Motor and sensation is grossly normal. Cranial nerves are grossly intact. PSYCH:  Oriented to person, place and time. Affect is normal.     Assessment/Plan: Recurrent large paraesophageal hernia in the setting of a patient with reflux and Barrett's esophagus.  She has already been seen by at [facility] .  Very challenging case.  Definitely encouraged her to seek second opinion ast tertiary center. Since [facility] does not take her insurance we will make arrangements to be seen at [facility] Dr. Maryjane Hurter.   From my community surgery perspective my options are limited and I was completely honest with her.  There is no need for emergent surgical intervention at this time.  She is appreciative.  Appropriate  referrals were made. Please note that I spent greater than 40 minutes in this encounter including personally reviewing imaging studies, counseling the patient, placing orders and performing appropriate documentation     Caroleen Hamman, MD [place] Surgeon

## 2022-06-15 NOTE — Telephone Encounter (Signed)
Prednisone 10 mg take  4 each am x 2 days,   2 each am x 2 days,  1 each am x 2 days and stop  

## 2022-06-15 NOTE — Telephone Encounter (Signed)
Patient states oxygen levels are in the 90%. Pharmacy is [facility] [address]. Patient phone number is [phone].

## 2022-06-17 ENCOUNTER — Telehealth: Payer: Self-pay

## 2022-06-17 ENCOUNTER — Other Ambulatory Visit: Payer: 59

## 2022-06-17 NOTE — Telephone Encounter (Signed)
Faxed referral to Dr. Owens Shark ([place]) at [phone].

## 2022-06-18 ENCOUNTER — Ambulatory Visit: Admission: RE | Admit: 2022-06-18 | Payer: 59 | Source: Ambulatory Visit

## 2022-06-22 MED ORDER — ACCU-CHEK SOFTCLIX LANCETS MISC: 12 refills | Status: DC

## 2022-06-23 ENCOUNTER — Other Ambulatory Visit: Payer: Self-pay

## 2022-06-23 ENCOUNTER — Telehealth: Payer: Self-pay | Admitting: Nurse Practitioner

## 2022-06-23 ENCOUNTER — Ambulatory Visit: Payer: Self-pay | Admitting: Internal Medicine

## 2022-06-23 NOTE — Telephone Encounter (Signed)
Pt advised  that first need to call her GI due to they are prescribing her med let them know that medication not helping

## 2022-06-24 ENCOUNTER — Telehealth: Payer: Self-pay

## 2022-06-24 NOTE — Patient Outreach (Signed)
  Care Coordination   06/24/2022 Name: Summer Murphy MRN: [id number] DOB: [birth year]   Care Coordination Outreach Attempts:  Successful contact made with patient. Patient states she is busy and not able to take call at this time.   Follow Up Plan:  Additional outreach attempts will be made to offer the patient care coordination information and services.   Encounter Outcome:  Pt. Request to Call Back   Care Coordination Interventions:  No, not indicated    Quinn Plowman [facility] [place] [phone] direct line

## 2022-07-21 ENCOUNTER — Ambulatory Visit (INDEPENDENT_AMBULATORY_CARE_PROVIDER_SITE_OTHER): Payer: Self-pay | Admitting: Nurse Practitioner

## 2022-07-21 ENCOUNTER — Encounter: Payer: Self-pay | Admitting: Nurse Practitioner

## 2022-07-21 VITALS — BP 130/75 | HR 95 | Temp 98.1°F | Resp 16 | Ht 63.0 in | Wt 165.8 lb

## 2022-07-21 DIAGNOSIS — F418 Other specified anxiety disorders: Secondary | ICD-10-CM

## 2022-07-21 DIAGNOSIS — F3342 Major depressive disorder, recurrent, in full remission: Secondary | ICD-10-CM

## 2022-07-21 DIAGNOSIS — T380X5A Adverse effect of glucocorticoids and synthetic analogues, initial encounter: Secondary | ICD-10-CM

## 2022-07-21 DIAGNOSIS — E119 Type 2 diabetes mellitus without complications: Secondary | ICD-10-CM | POA: Diagnosis not present

## 2022-07-21 DIAGNOSIS — Z79899 Other long term (current) drug therapy: Secondary | ICD-10-CM

## 2022-07-21 DIAGNOSIS — G63 Polyneuropathy in diseases classified elsewhere: Secondary | ICD-10-CM | POA: Diagnosis not present

## 2022-07-21 DIAGNOSIS — R739 Hyperglycemia, unspecified: Secondary | ICD-10-CM

## 2022-07-21 MED ORDER — TRAZODONE HCL 150 MG PO TABS: ORAL_TABLET | ORAL | 2 refills | Status: DC

## 2022-07-21 MED ORDER — CLONAZEPAM 1 MG PO TABS: 0.5000 mg | ORAL_TABLET | Freq: Two times a day (BID) | ORAL | 2 refills | Status: DC | PRN

## 2022-07-21 MED ORDER — BUSPIRONE HCL 10 MG PO TABS: 10.0000 mg | ORAL_TABLET | Freq: Two times a day (BID) | ORAL | 2 refills | Status: DC

## 2022-07-21 MED ORDER — DULOXETINE HCL 60 MG PO CPEP: 60.0000 mg | ORAL_CAPSULE | Freq: Every day | ORAL | 1 refills | Status: DC

## 2022-07-21 MED ORDER — MONTELUKAST SODIUM 10 MG PO TABS: 10.0000 mg | ORAL_TABLET | Freq: Every day | ORAL | 1 refills | Status: DC

## 2022-07-21 MED ORDER — TIRZEPATIDE 2.5 MG/0.5ML ~~LOC~~ SOAJ: 2.5000 mg | SUBCUTANEOUS | 2 refills | Status: DC

## 2022-07-21 MED ORDER — GABAPENTIN 600 MG PO TABS: 600.0000 mg | ORAL_TABLET | Freq: Two times a day (BID) | ORAL | 3 refills | Status: DC

## 2022-07-21 NOTE — Progress Notes (Signed)
[facility] [address] [place], [place] [address]  Internal MEDICINE  Office Visit Note  Patient Name: Summer Murphy  [id number]  [id number]  Date of Service: 07/21/2022  Chief Complaint  Patient presents with   Depression   Gastroesophageal Reflux   Hypertension   Follow-up    HPI Summer Murphy presents for a follow-up visit for high sugars, surgery, COPD and obesity.  Steroid induced hyperglycemia -- improving some now that she is off the steroids. Most recent A1c is 6.8 Going to surgical oncology about hernia surgery. Has appointment with cardiothoracic surgery as well Seeing new pulmonologist and was started on budesonide neb treatments which is helping. Her breathing is improving and she does not feel as short of breath Obesity -- wants to lose weight.  She is also due for refills on multiple medications today.    Current Medication: Outpatient Encounter Medications as of 07/21/2022  Medication Sig   Accu-Chek Softclix Lancets lancets Use as instructed once a daily DX R730.01   Blood Glucose Monitoring Suppl (ACCU-CHEK GUIDE ME) w/Device KIT Use as directed once a day DX R730.01   budesonide (PULMICORT) 0.25 MG/2ML nebulizer solution Take 2 mLs (0.25 mg total) by nebulization 2 (two) times daily.   diclofenac Sodium (VOLTAREN) 1 % GEL Apply 4 g topically 4 (four) times daily.   diltiazem (CARTIA XT) 240 MG 24 hr capsule Take 1 capsule (240 mg total) by mouth daily.   EPINEPHrine 0.3 mg/0.3 mL IJ SOAJ injection Inject 0.3 mg into the muscle as needed for anaphylaxis.   fluticasone (FLONASE) 50 MCG/ACT nasal spray Place 2 sprays into both nostrils daily as needed for rhinitis or allergies. Home med.   glucose blood (ACCU-CHEK GUIDE) test strip Use as instructed once daily Dx R73.01   ibuprofen (ADVIL) 800 MG tablet Take 800 mg by mouth every 8 (eight) hours as needed for moderate pain.   Ipratropium-Albuterol (COMBIVENT RESPIMAT) 20-100 MCG/ACT AERS respimat INHALE  1 PUFF INTO THE LUNGS EVERY 6 HOURS   ondansetron (ZOFRAN) 4 MG tablet TAKE 1 TABLET BY MOUTH TWICE DAILY AS NEEDED   pantoprazole (PROTONIX) 40 MG tablet Take 40 mg by mouth 2 (two) times daily.   pilocarpine (SALAGEN) 5 MG tablet Take 5 mg by mouth 2 (two) times daily.   rOPINIRole (REQUIP) 0.5 MG tablet TAKE 2 TABLETS BY MOUTH TWICE DAILY FOR RESTLESS LEGS   sennosides-docusate sodium (SENOKOT-S) 8.6-50 MG tablet Take 2 tablets by mouth daily as needed for constipation. Home med   SUMAtriptan (IMITREX) 100 MG tablet Take 1 tablet (100 mg total) by mouth every 2 (two) hours as needed (for migraine headaches.). May repeat in 1 hours if headache persists or recurs.   tirzepatide [facility]) 2.5 MG/0.5ML Pen Inject 2.5 mg into the skin once a week.   [DISCONTINUED] budesonide (PULMICORT) 0.25 MG/2ML nebulizer solution Take 0.25 mg by nebulization 2 (two) times daily.   [DISCONTINUED] busPIRone (BUSPAR) 10 MG tablet Take 1 tablet (10 mg total) by mouth 2 (two) times daily.   [DISCONTINUED] clonazePAM (KLONOPIN) 1 MG tablet Take 0.5 tablets (0.5 mg total) by mouth 2 (two) times daily as needed for anxiety.   [DISCONTINUED] DULoxetine (CYMBALTA) 60 MG capsule Take 1 capsule (60 mg total) by mouth at bedtime.   [DISCONTINUED] fluticasone furoate-vilanterol (BREO ELLIPTA) 100-25 MCG/ACT AEPB Inhale 1 puff into the lungs daily.   [DISCONTINUED] gabapentin (NEURONTIN) 600 MG tablet Take 1 tablet (600 mg total) by mouth 2 (two) times daily.   [DISCONTINUED] montelukast (  SINGULAIR) 10 MG tablet Take 1 tablet (10 mg total) by mouth at bedtime.   [DISCONTINUED] predniSONE (DELTASONE) 10 MG tablet Take  4 each am x 2 days,   2 each am x 2 days,  1 each am x 2 days and stop   [DISCONTINUED] traZODone (DESYREL) 150 MG tablet TAKE 1 TABLET(150 MG) BY MOUTH AT BEDTIME   busPIRone (BUSPAR) 10 MG tablet Take 1 tablet (10 mg total) by mouth 2 (two) times daily.   clonazePAM (KLONOPIN) 1 MG tablet Take 0.5 tablets (0.5  mg total) by mouth 2 (two) times daily as needed for anxiety.   DULoxetine (CYMBALTA) 60 MG capsule Take 1 capsule (60 mg total) by mouth at bedtime.   gabapentin (NEURONTIN) 600 MG tablet Take 1 tablet (600 mg total) by mouth 2 (two) times daily.   ipratropium-albuterol (DUONEB) 0.5-2.5 (3) MG/3ML SOLN Take 3 mLs by nebulization every 4 (four) hours as needed (for shortness of breath/wheezing).   montelukast (SINGULAIR) 10 MG tablet Take 1 tablet (10 mg total) by mouth at bedtime.   traZODone (DESYREL) 150 MG tablet TAKE 1 TABLET(150 MG) BY MOUTH AT BEDTIME   No facility-administered encounter medications on file as of 07/21/2022.    Surgical History: Past Surgical History:  Procedure Laterality Date   ABDOMINAL HYSTERECTOMY     AMPUTATION TOE Left 07/31/2016   Procedure: AMPUTATION TOE/MPJ 2nd toe;  Surgeon: Linus Galas, DPM;  Location: [facility] ORS;  Service: Podiatry;  Laterality: Left;   APPENDECTOMY  1990   BACK SURGERY     low back   BREAST SURGERY     bilateral breast reduction   CARDIAC ELECTROPHYSIOLOGY STUDY AND ABLATION     CHOLECYSTECTOMY  1990   COLONOSCOPY WITH PROPOFOL N/A 01/04/2017   Procedure: COLONOSCOPY WITH PROPOFOL;  Surgeon: Scot Jun, MD;  Location[facility] END[facility]COPY;  Service: Endoscopy;  Laterality: N/A;   CORNEAL TRANSPLANT     ESOPHAGOGASTRODUODENOSCOPY N/A 04/09/2021   Procedure: ESOPHAGOGASTRODUODENOSCOPY (EGD);  Surgeon: Toney Reil, MD;  Location: [facility] ENDOSCOPY;  Service: Gastroenterology;  Laterality: N/A;   ESOPHAGOGASTRODUODENOSCOPY (EGD) WITH PROPOFOL N/A 01/04/2017   Procedure: ESOPHAGOGASTRODUODENOSCOPY (EGD) WITH PROPOFOL;  Surgeon: Scot Jun, MD;  Location: [facility] ENDOSCOPY;  Service: Endoscopy;  Laterality: N/A;   EXCISION BONE CYST Left 07/31/2016   Procedure: EXCISION BONE CYST/exostectomy 28124/left 2nd;  Surgeon: Linus Galas, DPM;  Location: [facility] ORS;  Service: Podiatry;  Laterality: Left;   EXTRACORPOREAL SHOCK WAVE  LITHOTRIPSY Left 09/12/2015   Procedure: EXTRACORPOREAL SHOCK WAVE LITHOTRIPSY (ESWL);  Surgeon: Vanna Scotland, MD;  Location: [facility] ORS;  Service: Urology;  Laterality: Left;   FRACTURE SURGERY     left foot   HARDWARE REMOVAL Left 11/14/2020   Procedure: HARDWARE REMOVAL;  Surgeon: Kennedy Bucker, MD;  Location: [facility] ORS;  Service: Orthopedics;  Laterality: Left;   HH repair     Fundoplication   JOINT REPLACEMENT Bilateral 2013,2014   total knees   LAPAROSCOPIC HYSTERECTOMY     LITHOTRIPSY     periprosthetic supracondylar fracture of left femur  02/16/2020   [facility]   TONSILLECTOMY     TOTAL KNEE REVISION Right 11/14/2019   Procedure: Revision patella and tibial polyethylene;  Surgeon: Kennedy Bucker, MD;  Location: [facility] ORS;  Service: Orthopedics;  Laterality: Right;   URETEROSCOPY      Medical History: Past Medical History:  Diagnosis Date   Acid reflux    Anemia    Anxiety    Arrhythmia    treated with  meds and has no current problems   Arthritis    most uncomfortable in knees   Asthma    uses several inhalers   Depression    Fever blister    Hematuria    History of kidney stones    Hypertension    Hypoglycemia    Left flank pain    Migraine    Pre-diabetes    Restless leg    Yeast vaginitis     Family History: Family History  Problem Relation Age of Onset   Hypertension Mother    Stroke Mother    Prostate cancer Father    Kidney Stones Father    Diabetes Brother    Hypertension Brother    Breast cancer Maternal Aunt    Kidney disease Neg Hx     Social History   Socioeconomic History   Marital status: Widowed    Spouse name: Not on file   Number of children: Not on file   Years of education: Not on file   Highest education level: Not on file  Occupational History   Not on file  Tobacco Use   Smoking status: Never    Passive exposure: Past   Smokeless tobacco: Never  Vaping Use   Vaping Use: Never used  Substance and Sexual Activity    Alcohol use: No   Drug use: No   Sexual activity: Not on file  Other Topics Concern   Not on file  Social History Narrative   Not on file   Social Determinants of Health   Financial Resource Strain: Not on file  Food Insecurity: No Food Insecurity (06/02/2022)   Hunger Vital Sign    Worried About Running Out of Food in the Last Year: Never true    Ran Out of Food in the Last Year: Never true  Transportation Needs: No Transportation Needs (06/02/2022)   PRAPARE - Administrator, Civil Service (Medical): No    Lack of Transportation (Non-Medical): No  Physical Activity: Not on file  Stress: Not on file  Social Connections: Not on file  Intimate Partner Violence: Not At Risk (06/02/2022)   Humiliation, Afraid, Rape, and Kick questionnaire    Fear of Current or Ex-Partner: No    Emotionally Abused: No    Physically Abused: No    Sexually Abused: No      Review of Systems  Constitutional:  Negative for fatigue and fever.  HENT:  Negative for congestion, mouth sores and postnasal drip.   Respiratory:  Negative for cough.   Cardiovascular:  Negative for chest pain.  Genitourinary:  Negative for flank pain.  Psychiatric/Behavioral: Negative.      Vital Signs: BP 130/75   Pulse 95   Temp 98.1 F (36.7 C)   Resp 16   Ht 5\' 3"  (1.6 m)   Wt 165 lb 12.8 oz (75.2 kg)   SpO2 98%   BMI 29.37 kg/m    Physical Exam Vitals reviewed.  Constitutional:      Appearance: Normal appearance.  HENT:     Head: Normocephalic and atraumatic.     Nose: Nose normal.     Mouth/Throat:     Mouth: Mucous membranes are moist.     Pharynx: No posterior oropharyngeal erythema.  Eyes:     Extraocular Movements: Extraocular movements intact.     Pupils: Pupils are equal, round, and reactive to light.  Cardiovascular:     Pulses: Normal pulses.     Heart sounds: Normal heart  sounds.  Pulmonary:     Effort: Pulmonary effort is normal.     Breath sounds: Normal breath sounds.   Neurological:     General: No focal deficit present.     Mental Status: She is alert.  Psychiatric:        Mood and Affect: Mood normal.        Behavior: Behavior normal.        Assessment/Plan: 1. Diabetes mellitus without complication (HCC) Try low dose mounjaro which will help to control her glucose level as well as help her lose weight.  Urine specimen sent for microalbumin.  - Urine Microalbumin w/creat. ratio - tirzepatide (MOUNJARO) 2.5 MG/0.5ML Pen; Inject 2.5 mg into the skin once a week.  Dispense: 2 mL; Refill: 2  2. Steroid-induced hyperglycemia Try mounjaro low dose which will help control her sugars  - Urine Microalbumin w/creat. ratio - tirzepatide (MOUNJARO) 2.5 MG/0.5ML Pen; Inject 2.5 mg into the skin once a week.  Dispense: 2 mL; Refill: 2  3. Polyneuropathy associated with underlying disease (HCC) Continue gabapentin as prescribed.  - gabapentin (NEURONTIN) 600 MG tablet; Take 1 tablet (600 mg total) by mouth 2 (two) times daily.  Dispense: 60 tablet; Refill: 3  4. Encounter for medication review Medication list reviewed, updated and refills ordered  - ibuprofen (ADVIL) 800 MG tablet; Take 800 mg by mouth every 8 (eight) hours as needed for moderate pain. - clonazePAM (KLONOPIN) 1 MG tablet; Take 0.5 tablets (0.5 mg total) by mouth 2 (two) times daily as needed for anxiety.  Dispense: 30 tablet; Refill: 2 - busPIRone (BUSPAR) 10 MG tablet; Take 1 tablet (10 mg total) by mouth 2 (two) times daily.  Dispense: 60 tablet; Refill: 2 - gabapentin (NEURONTIN) 600 MG tablet; Take 1 tablet (600 mg total) by mouth 2 (two) times daily.  Dispense: 60 tablet; Refill: 3 - traZODone (DESYREL) 150 MG tablet; TAKE 1 TABLET(150 MG) BY MOUTH AT BEDTIME  Dispense: 30 tablet; Refill: 2 - DULoxetine (CYMBALTA) 60 MG capsule; Take 1 capsule (60 mg total) by mouth at bedtime.  Dispense: 90 capsule; Refill: 1 - montelukast (SINGULAIR) 10 MG tablet; Take 1 tablet (10 mg total) by  mouth at bedtime.  Dispense: 90 tablet; Refill: 1  5. Situational anxiety Stable, continue clonazepam as prescribed. As well as buspirone and duloxetine.  - clonazePAM (KLONOPIN) 1 MG tablet; Take 0.5 tablets (0.5 mg total) by mouth 2 (two) times daily as needed for anxiety.  Dispense: 30 tablet; Refill: 2 - busPIRone (BUSPAR) 10 MG tablet; Take 1 tablet (10 mg total) by mouth 2 (two) times daily.  Dispense: 60 tablet; Refill: 2 - DULoxetine (CYMBALTA) 60 MG capsule; Take 1 capsule (60 mg total) by mouth at bedtime.  Dispense: 90 capsule; Refill: 1  6. Recurrent major depressive disorder, in full remission (HCC) Stable on current medications. Continue duloxetine, buspirone and trazodone as prescribed.  - busPIRone (BUSPAR) 10 MG tablet; Take 1 tablet (10 mg total) by mouth 2 (two) times daily.  Dispense: 60 tablet; Refill: 2 - DULoxetine (CYMBALTA) 60 MG capsule; Take 1 capsule (60 mg total) by mouth at bedtime.  Dispense: 90 capsule; Refill: 1   General Counseling: Summer Murphy verbalizes understanding of the findings of todays visit and agrees with plan of treatment. I have discussed any further diagnostic evaluation that may be needed or ordered today. We also reviewed her medications today. she has been encouraged to call the office with any questions or concerns that should arise related to  todays visit.    Orders Placed This Encounter  Procedures   Urine Microalbumin w/creat. ratio    Meds ordered this encounter  Medications   tirzepatide (MOUNJARO) 2.5 MG/0.5ML Pen    Sig: Inject 2.5 mg into the skin once a week.    Dispense:  2 mL    Refill:  2    Dx code E11.65   clonazePAM (KLONOPIN) 1 MG tablet    Sig: Take 0.5 tablets (0.5 mg total) by mouth 2 (two) times daily as needed for anxiety.    Dispense:  30 tablet    Refill:  2    Please discontinue all previous orders for clonazepam. Fill this script today   busPIRone (BUSPAR) 10 MG tablet    Sig: Take 1 tablet (10 mg total) by  mouth 2 (two) times daily.    Dispense:  60 tablet    Refill:  2    For future refills   gabapentin (NEURONTIN) 600 MG tablet    Sig: Take 1 tablet (600 mg total) by mouth 2 (two) times daily.    Dispense:  60 tablet    Refill:  3   traZODone (DESYREL) 150 MG tablet    Sig: TAKE 1 TABLET(150 MG) BY MOUTH AT BEDTIME    Dispense:  30 tablet    Refill:  2   DULoxetine (CYMBALTA) 60 MG capsule    Sig: Take 1 capsule (60 mg total) by mouth at bedtime.    Dispense:  90 capsule    Refill:  1    For future refills   montelukast (SINGULAIR) 10 MG tablet    Sig: Take 1 tablet (10 mg total) by mouth at bedtime.    Dispense:  90 tablet    Refill:  1    Return in about 11 weeks (around 10/06/2022) for F/U, anxiety med refill, Summer Murphy PCP.   Total time spent:30 Minutes Time spent includes review of chart, medications, test results, and follow up plan with the patient.   [place] Controlled Substance Database was reviewed by me.  This patient was seen by [name], FNP-C in collaboration with Dr. Beverely Risen as a part of collaborative care agreement.   Summer Murphy R. Tedd Sias, MSN, FNP-C Internal medicine

## 2022-07-22 LAB — MICROALBUMIN / CREATININE URINE RATIO
Creatinine, Urine: 124.7 mg/dL
Microalb/Creat Ratio: 8 mg/g creat (ref 0–29)
Microalbumin, Urine: 10 ug/mL

## 2022-08-01 ENCOUNTER — Encounter: Payer: Self-pay | Admitting: Nurse Practitioner

## 2022-08-03 ENCOUNTER — Ambulatory Visit: Payer: 59 | Admitting: Cardiovascular Disease

## 2022-08-04 ENCOUNTER — Telehealth: Payer: Self-pay | Admitting: Nurse Practitioner

## 2022-08-04 NOTE — Telephone Encounter (Signed)
s/w patient to schedule diabetic eye exam here at office. She stated she has done at regular eye doctor-Summer Murphy 

## 2022-08-07 ENCOUNTER — Other Ambulatory Visit
Admission: RE | Admit: 2022-08-07 | Discharge: 2022-08-07 | Disposition: A | Discharge status: Home / Self Care | Payer: 59 | Source: Ambulatory Visit | Attending: Internal Medicine | Admitting: Internal Medicine

## 2022-08-07 ENCOUNTER — Ambulatory Visit (INDEPENDENT_AMBULATORY_CARE_PROVIDER_SITE_OTHER): Payer: 59 | Admitting: Internal Medicine

## 2022-08-07 ENCOUNTER — Encounter: Payer: Self-pay | Admitting: Internal Medicine

## 2022-08-07 VITALS — BP 136/82 | HR 96 | Temp 98.2°F | Ht 63.0 in | Wt 164.4 lb

## 2022-08-07 DIAGNOSIS — R0602 Shortness of breath: Secondary | ICD-10-CM | POA: Diagnosis not present

## 2022-08-07 DIAGNOSIS — R0609 Other forms of dyspnea: Secondary | ICD-10-CM

## 2022-08-07 DIAGNOSIS — J4489 Other specified chronic obstructive pulmonary disease: Secondary | ICD-10-CM

## 2022-08-07 LAB — CBC WITH DIFFERENTIAL/PLATELET
Abs Immature Granulocytes: 0.03 10*3/uL (ref 0.00–0.07)
Basophils Absolute: 0.1 10*3/uL (ref 0.0–0.1)
Basophils Relative: 1 %
Eosinophils Absolute: 0.3 10*3/uL (ref 0.0–0.5)
Eosinophils Relative: 4 %
HCT: 32.9 % — ABNORMAL LOW (ref 36.0–46.0)
Hemoglobin: 10.6 g/dL — ABNORMAL LOW (ref 12.0–15.0)
Immature Granulocytes: 0 %
Lymphocytes Relative: 22 %
Lymphs Abs: 1.9 10*3/uL (ref 0.7–4.0)
MCH: 28 pg (ref 26.0–34.0)
MCHC: 32.2 g/dL (ref 30.0–36.0)
MCV: 87 fL (ref 80.0–100.0)
Monocytes Absolute: 0.6 10*3/uL (ref 0.1–1.0)
Monocytes Relative: 7 %
Neutro Abs: 5.8 10*3/uL (ref 1.7–7.7)
Neutrophils Relative %: 66 %
Platelets: 266 10*3/uL (ref 150–400)
RBC: 3.78 MIL/uL — ABNORMAL LOW (ref 3.87–5.11)
RDW: 13.9 % (ref 11.5–15.5)
WBC: 8.7 10*3/uL (ref 4.0–10.5)
nRBC: 0 % (ref 0.0–0.2)

## 2022-08-07 LAB — BASIC METABOLIC PANEL
Anion gap: 11 (ref 5–15)
BUN: 22 mg/dL (ref 8–23)
CO2: 23 mmol/L (ref 22–32)
Calcium: 8.6 mg/dL — ABNORMAL LOW (ref 8.9–10.3)
Chloride: 103 mmol/L (ref 98–111)
Creatinine, Ser: 0.59 mg/dL (ref 0.44–1.00)
GFR, Estimated: >60 mL/min (ref >60)
Glucose, Bld: 97 mg/dL (ref 70–99)
Potassium: 3.6 mmol/L (ref 3.5–5.1)
Sodium: 137 mmol/L (ref 135–145)

## 2022-08-07 LAB — BRAIN NATRIURETIC PEPTIDE: B Natriuretic Peptide: 32.4 pg/mL (ref 0.0–100.0)

## 2022-08-07 LAB — NITRIC OXIDE: Nitric Oxide: 31

## 2022-08-07 LAB — SEDIMENTATION RATE: Sed Rate: 11 mm/hr (ref 0–30)

## 2022-08-07 LAB — TSH: TSH: 4.712 u[IU]/mL — ABNORMAL HIGH (ref 0.350–4.500)

## 2022-08-07 LAB — D-DIMER, QUANTITATIVE: D-Dimer, Quant: 1.08 ug/mL-FEU — ABNORMAL HIGH (ref 0.00–0.50)

## 2022-08-07 MED ORDER — BUDESONIDE 0.5 MG/2ML IN SUSP: RESPIRATORY_TRACT | 12 refills | Status: DC

## 2022-08-07 MED ORDER — IPRATROPIUM-ALBUTEROL 0.5-2.5 (3) MG/3ML IN SOLN: 3.0000 mL | Freq: Four times a day (QID) | RESPIRATORY_TRACT | 11 refills | Status: DC

## 2022-08-07 NOTE — Progress Notes (Unsigned)
Summer Murphy, female    DOB: 1955/07/21   MRN: [id number]   Brief patient profile:  67  yowf  never smoker  self referred to pulmonary clinic in [facility]  06/12/2022 for severe chronic asthma previously maint on singulair, combivent and neb   Allergy shots x one month   > could not tolerate     Breathing has not been right since the first pna  04/24/22   Admit date:     06/01/2022  Discharge date: 06/03/22   Discharge Diagnoses:   Acute asthma exacerbation   Aspiration pneumonia (HCC)   Acute respiratory failure with hypoxia (HCC)   Essential hypertension   Restless leg syndrome   Anxiety   Uncontrolled type 2 diabetes mellitus with hyperglycemia, without long-term current use of insulin [facility])   Hospital Course: Taken from H&P.   Summer Murphy is a 67 y.o. Caucasian female with medical history significant for asthma, GERD, hypertension, migraine, prediabetes, restless leg syndrome and anxiety, who presented to the emergency room with complain of worsening dyspnea,  associated productive cough and wheezing over the last couple of days .   ED Course: When she came to the ER BP was 162/144 with heart rate of 102 and respiratory to 25 with an otherwise normal vital signs.  Labs revealed an ABG with pH 7.57 and HCO3 of 27.5 with pO2 of 121 and pCO2 of 30 and O2 sat of 97.3%.  CMP revealed mild hyponatremia blood glucose of 170 with otherwise unremarkable levels.  BNP was 22.8.  Lactic was 2.5 and later was 1.2 and procalcitonin 0.44.  CBC was within normal.  Influenza, COVID-19 and RSV PCR's came back negative.  Blood cultures were drawn.   EKG : Sinus tachycardia with rate of 103 with PVCs. Imaging: Portable chest ray showed no acute cardiopulmonary disease.   Patient received 3 DuoNeb treatment, 2 g of IV magnesium sulfate and 125 mg of IV Solu-Medrol.  Initially placed on BiPAP and then later transitioned to 3 L of oxygen. She was also started on Unasyn for concern of  aspiration pneumonia.   3/12: Vital stable, saturating 99% on 3 L of oxygen-we will try to wean.  Labs with leukocytosis at 11.4.  Significantly elevated CBG above 300, patient has an history of prediabetes with most recent A1c of 6.8 on 2/24.  Added moderate SSI and 3 units with meal. Patient is currently on steroid.    3/13.  Patient feeling better.  Currently breathing comfortably on room air.  She states that she always has a little short of breath but a lot better than when she came in.     Assessment and Plan: * Acute asthma exacerbation Patient on room air.  Patient not wheezing today.  Will send home on prednisone.  Patient has inhalers and nebulizers at home.     Aspiration pneumonia (HCC) - She has a procalcitonin of 0.44 and lactic acid of 2.5 initially that was later down to 1.2.  Preliminary blood cultures negative. -Imaging was negative for pneumonia even though she was given Unasyn.  I will finish a 5-day course with Augmentin for few days upon discharge.   Acute respiratory failure with hypoxia (HCC) Required BiPAP initially for respiratory distress.  This has resolved and patient breathing comfortably on room air.   Essential hypertension - We will continue Cartia XT.   Restless leg syndrome - We will continue ropinirole.   Anxiety - We will continue her Klonopin and Cymbalta.  Uncontrolled type 2 diabetes mellitus with hyperglycemia, without long-term current use of insulin (HCC) Hyperglycemia secondary to steroids.  Hemoglobin A1c 6.8 making her diabetic.  Low carbohydrate diet discussed.     History of Present Illness  06/12/2022  Pulmonary/ 1st office eval/ Mathew Storck / [place]  [facility]  last prednisone 06/03/22 / maint on BREO Chief Complaint  Patient presents with   Consult    Has had asthma since she was a child. Dx with COPD. Has a hernia that causes her SOB. Wheezing. Dry cough.  ED- for Pneumonia in Feb and Asthma attack on 06/01/2022.   Dyspnea:  walk slow  at grocery store stops every aisle = MMR[employer]3 = can't walk 100 yards even at a slow pace at a flat grade s stopping due to sob   Cough: nothing comes up/ globus sensation/ sometimes gags her / mostly p supper quiets down overnight min in am  Sleep: ok s noct inhalers  SABA use: combivent 4 x daily Rec Pantoprazole 40 mg Take 30- 60 min before your first and last meals of the day  GERD diet reviewed, bed blocks rec  Stop BREO for now  Duoneb 1st thing in am and 12 hours later add  budesonide one vial =  0.25 twice daily same bulb with duoneb  Only use your albuterol as a rescue medication If not improving > Prednisone 10 mg take  4 each am x 2 days,   2 each am x 2 days,  1 each am x 2 days and stop  For drainage / throat tickle try take CHLORPHENIRAMINE  4 mg    08/07/2022  f/u ov/[name]/ [facility] re: chronic asthma with doe since admit 04/2022 with dx pna  maint on duoneb/bud 0.25 mg bid and combivent prn   Chief Complaint  Patient presents with   Follow-up    SOB with exertion. A little wheezing. No cough.   Dyspnea:  walking 100 ft at community hallway > out of breath Cough: only when food gets stuck  then coughs to point of vomiting, does better with little bites and hasn't happened for the last fews weeks prior to OV   Sleeping: flat bed/ 2 pillows all night no resp cc SABA use: combient 2-3 per day, neb works better   02: none    No obvious day to day or daytime variability or assoc excess/ purulent sputum or mucus plugs or hemoptysis or cp or chest tightness,   or overt sinus or hb symptoms.   Sleeping  without nocturnal  or early am exacerbation  of respiratory  c/o's or need for noct saba. Also denies any obvious fluctuation of symptoms with weather or environmental changes or other aggravating or alleviating factors except as outlined above   No unusual exposure hx or h/o childhood pna/ asthma or knowledge of premature birth.  Current Allergies, Complete Past Medical  History, Past Surgical History, Family History, and Social History were reviewed in [employer] record.  ROS  The following are not active complaints unless bolded Hoarseness, sore throat, dysphagia, dental problems, itching, sneezing,  nasal congestion or discharge of excess mucus or purulent secretions, ear ache,   fever, chills, sweats, unintended wt loss or wt gain, classically pleuritic or exertional cp,  orthopnea pnd or arm/hand swelling  or leg swelling, presyncope, palpitations, abdominal pain, anorexia, nausea, vomiting, diarrhea  or change in bowel habits or change in bladder habits, change in stools or change in urine, dysuria, hematuria,  rash, arthralgias, visual complaints, headache, numbness, weakness or ataxia or problems with walking or coordination,  change in mood or  memory.        Current Meds  Medication Sig   Accu-Chek Softclix Lancets lancets Use as instructed once a daily DX R730.01   Blood Glucose Monitoring Suppl (ACCU-CHEK GUIDE ME) w/Device KIT Use as directed once a day DX R730.01   budesonide (PULMICORT) 0.25 MG/2ML nebulizer solution Take 2 mLs (0.25 mg total) by nebulization 2 (two) times daily.   busPIRone (BUSPAR) 10 MG tablet Take 1 tablet (10 mg total) by mouth 2 (two) times daily.   clonazePAM (KLONOPIN) 1 MG tablet Take 0.5 tablets (0.5 mg total) by mouth 2 (two) times daily as needed for anxiety.   diclofenac Sodium (VOLTAREN) 1 % GEL Apply 4 g topically 4 (four) times daily.   diltiazem (CARTIA XT) 240 MG 24 hr capsule Take 1 capsule (240 mg total) by mouth daily.   DULoxetine (CYMBALTA) 60 MG capsule Take 1 capsule (60 mg total) by mouth at bedtime.   EPINEPHrine 0.3 mg/0.3 mL IJ SOAJ injection Inject 0.3 mg into the muscle as needed for anaphylaxis.   fluticasone (FLONASE) 50 MCG/ACT nasal spray Place 2 sprays into both nostrils daily as needed for rhinitis or allergies. Home med.   gabapentin (NEURONTIN) 600 MG tablet Take 1 tablet  (600 mg total) by mouth 2 (two) times daily.   glucose blood (ACCU-CHEK GUIDE) test strip Use as instructed once daily Dx R73.01   ibuprofen (ADVIL) 800 MG tablet Take 800 mg by mouth every 8 (eight) hours as needed for moderate pain.   Ipratropium-Albuterol (COMBIVENT RESPIMAT) 20-100 MCG/ACT AERS respimat INHALE 1 PUFF INTO THE LUNGS EVERY 6 HOURS   montelukast (SINGULAIR) 10 MG tablet Take 1 tablet (10 mg total) by mouth at bedtime.   ondansetron (ZOFRAN) 4 MG tablet TAKE 1 TABLET BY MOUTH TWICE DAILY AS NEEDED   pantoprazole (PROTONIX) 40 MG tablet Take 40 mg by mouth 2 (two) times daily.   pilocarpine (SALAGEN) 5 MG tablet Take 5 mg by mouth 2 (two) times daily.   rOPINIRole (REQUIP) 0.5 MG tablet TAKE 2 TABLETS BY MOUTH TWICE DAILY FOR RESTLESS LEGS   sennosides-docusate sodium (SENOKOT-S) 8.6-50 MG tablet Take 2 tablets by mouth daily as needed for constipation. Home med   SUMAtriptan (IMITREX) 100 MG tablet Take 1 tablet (100 mg total) by mouth every 2 (two) hours as needed (for migraine headaches.). May repeat in 1 hours if headache persists or recurs.   tirzepatide [facility]) 2.5 MG/0.5ML Pen Inject 2.5 mg into the skin once a week.   traZODone (DESYREL) 150 MG tablet TAKE 1 TABLET(150 MG) BY MOUTH AT BEDTIME                Past Medical History:  Diagnosis Date   Acid reflux    Anemia    Anxiety    Arrhythmia    treated with meds and has no current problems   Arthritis    most uncomfortable in knees   Asthma    uses several inhalers   Depression    Fever blister    Hematuria    History of kidney stones    Hypertension    Hypoglycemia    Left flank pain    Migraine    Pre-diabetes    Restless leg    Yeast vaginitis       Objective:    Wts  08/07/2022  164   07/21/22 165 lb 12.8 oz (75.2 kg)  06/15/22 166 lb (75.3 kg)  06/12/22 168 lb 3.2 oz (76.3 kg)      Vital signs reviewed  08/07/2022  - Note at rest 02 sats  95% on RA   General appearance:     anxious slt hyperkinetic amb wf nad at rest      HEENT : Oropharynx  clear      Nasal turbinates nl    NECK :  without  apparent JVD/ palpable Nodes/TM    LUNGS: no acc muscle use,  mod kyphotic contour chest which is clear to A and P bilaterally without cough on insp or exp maneuvers   CV:  RRR  no s3 or murmur or increase in P2, and no edema   ABD:  od obese soft and nontender    MS:  Nl gait/ ext warm without deformities Or obvious joint restrictions  calf tenderness, cyanosis or clubbing    SKIN: warm and dry without lesions    NEURO:  alert, approp, nl sensorium with  no motor or cerebellar deficits apparent.     Labs ordered/ reviewed:      Chemistry      Component Value Date/Time   NA 137 08/07/2022 1456   NA 142 01/15/2022 1042   NA 136 06/28/2014 1710   K 3.6 08/07/2022 1456   K 3.5 06/28/2014 1710   CL 103 08/07/2022 1456   CL 102 06/28/2014 1710   CO2 23 08/07/2022 1456   CO2 25 06/28/2014 1710   BUN 22 08/07/2022 1456   BUN 11 01/15/2022 1042   BUN 13 06/28/2014 1710   CREATININE 0.59 08/07/2022 1456   CREATININE 0.50 06/28/2014 1710      Component Value Date/Time   CALCIUM 8.6 (L) 08/07/2022 1456   CALCIUM 8.4 (L) 06/28/2014 1710   ALKPHOS 74 06/01/2022 1756   ALKPHOS 92 06/28/2014 1710   AST 28 06/01/2022 1756   AST 19 06/28/2014 1710   ALT 28 06/01/2022 1756   ALT 14 06/28/2014 1710   BILITOT 0.6 06/01/2022 1756   BILITOT 0.2 01/15/2022 1042   BILITOT 0.2 (L) 06/28/2014 1710        Lab Results  Component Value Date   WBC 8.7 08/07/2022   HGB 10.6 (L) 08/07/2022   HCT 32.9 (L) 08/07/2022   MCV 87.0 08/07/2022   PLT 266 08/07/2022       EOS                                                              0.3                                    08/07/2022   Lab Results  Component Value Date   DDIMER 1.08 (H) 08/07/2022      Lab Results  Component Value Date   TSH 4.712 (H) 08/07/2022     BNP  08/07/2022  32    Lab Results   Component Value Date   ESRSEDRATE 11 08/07/2022   ESRSEDRATE 3 04/03/2019   ESRSEDRATE 7 10/17/2013       I personally reviewed images and agree with radiology impression as  follows:   Chest CT chest   08/02/22 s contrast Mild diffuse bronchial wall thickening. No focal consolidation. Central airways are patent. No pleural effusion or pneumothorax.  Enteric contrast material within mildly distended esophagus and moderate sized paraesophageal hernia. Gastroesophageal junction is located above the diaphragmatic hiatus. Surgical clips around the paraesophageal hernia and in the abdomen consistent with patient's clinical history of 2 prior Nissen fundoplications.     Assessment

## 2022-08-07 NOTE — Patient Instructions (Addendum)
Pantoprazole 40 mg Take 30- 60 min before your first and last meals of the day   Continue nebulizer four times daily but double the budesonide to 0.5 mg twice daily   Please remember to go to the lab department   for your tests - we will call you with the results when they are available.  Please schedule a follow up office visit in 6-8  weeks, call sooner if needed

## 2022-08-08 ENCOUNTER — Encounter: Payer: Self-pay | Admitting: Internal Medicine

## 2022-08-08 NOTE — Assessment & Plan Note (Addendum)
Never smoker - Spirometry  11/07/21  FEV1 1.41 (63%)  Ratio 0.69.5 mild concavity to f/v,  FRC 83% - FENO  06/12/2022  = 27  - 06/12/2022  After extensive coaching inhaler device,  effectiveness =  50% with SMI very short Ti) so rec change to duoneb bid with pulmicort 0.25 mg  - PFTs 08/04/22  FEV1  1.13 ratio 0.66  with nl dlco  - FENO 08/07/2022 31 on bud 0.25 mg bid > increased to 0.5 mg bid with duoneb qid ? Needs LABA next if affordable on her plan?   DDX of  difficult airways management almost all start with A and  include Adherence, Ace Inhibitors, Acid Reflux, Active Sinus Disease, Alpha 1 Antitripsin deficiency, Anxiety masquerading as Airways dz,  ABPA,  Allergy(esp in young), Aspiration (esp in elderly), Adverse effects of meds,  Active smoking or vaping, A bunch of PE's (a small clot burden can't cause this syndrome unless there is already severe underlying pulm or vascular dz with poor reserve) plus two Bs  = Bronchiectasis and Beta blocker use..and one C= CHF   Bolded most likely issues to consider  Adherence is always the initial "prime suspect" and is a multilayered concern that requires a "trust but verify" approach in every patient - starting with knowing how to use medications, especially inhalers, correctly, keeping up with refills and understanding the fundamental difference between maintenance and prns vs those medications only taken for a very short course and then stopped and not refilled.  - 08/07/2022  After extensive coaching inhaler device,  effectiveness =  80% from a baseline of < 50%   ? Acid (or non-acid) GERD > always difficult to exclude as up to 75% of pts in some series report no assoc GI/ Heartburn symptoms> rec max (24h)  acid suppression and diet restrictions/ reviewed and instructions given in writing.   ? Allergy > denied improvement on pred but FENO is slightly elevated > try double dose of budesonide and check allergy screen which includes IGE to r/o ABPA which  seems unlikely   ? Aspiration > cough to point of vomiting p eating suggestive > f/u GI planned   ? Anxiety/depression/ deconditioning  > usually at the bottom of this list of usual suspects but s  note already on psychotropics and may interfere with adherence and also interpretation of response or lack thereof to symptom management which can be quite subjective.   ? A bunch of PEs >  D dimer very high normal range value (seen commonly in the elderly or chronically ill)  may miss small peripheral pe, the clot burden with sob is moderately high and the d dimer  has a very high neg pred value if used in this setting.    ? CHF > BNP of 32  excludes

## 2022-08-08 NOTE — Assessment & Plan Note (Signed)
Onset p pna 04/2022 - 08/07/2022   Walked on RA  x  1  lap(s) =  approx 175  ft  @ slow to mod pace, stopped due to sob with lowest 02 sats 96%    See AB for DDX  plus additional concerns of mild anema and ? Thyroid disorder    Lab Results  Component Value Date   HGB 10.6 (L) 08/07/2022   HGB 11.6 (L) 06/02/2022   HGB 12.0 06/01/2022   HGB 13.4 08/29/2021   HGB 14.2 07/01/2021   HGB 10.5 (L) 10/11/2020     Lab Results  Component Value Date   TSH 4.712 (H) 08/07/2022     These appear to be chronic issues and just need PCP f/u for now as I doubt either value would explaind doe x 175 ft though the anemia of greater concern with ex intol, esp if trending further down  Each maintenance medication was reviewed in detail including emphasizing most importantly the difference between maintenance and prns and under what circumstances the prns are to be triggered using an action plan format where appropriate.  Total time for H and P, chart review, counseling,  directly observing portions of ambulatory 02 saturation study/ and generating customized AVS unique to this office visit / same day charting > 40 min for multiple  refractory respiratory  symptoms of uncertain etiology

## 2022-08-11 LAB — IGE: IgE (Immunoglobulin E), Serum: 174 IU/mL (ref 6–495)

## 2022-08-13 ENCOUNTER — Telehealth: Payer: Self-pay

## 2022-08-13 LAB — ALPHA-1-ANTITRYPSIN PHENOTYP: A-1 Antitrypsin, Ser: 125 mg/dL (ref 101–187)

## 2022-08-18 ENCOUNTER — Ambulatory Visit (INDEPENDENT_AMBULATORY_CARE_PROVIDER_SITE_OTHER): Payer: Self-pay | Admitting: Nurse Practitioner

## 2022-08-18 ENCOUNTER — Encounter: Payer: Self-pay | Admitting: Nurse Practitioner

## 2022-08-18 VITALS — BP 146/82 | HR 98 | Temp 98.5°F | Resp 16 | Ht 63.0 in | Wt 160.0 lb

## 2022-08-18 DIAGNOSIS — D649 Anemia, unspecified: Secondary | ICD-10-CM | POA: Diagnosis not present

## 2022-08-18 DIAGNOSIS — E119 Type 2 diabetes mellitus without complications: Secondary | ICD-10-CM

## 2022-08-18 DIAGNOSIS — R739 Hyperglycemia, unspecified: Secondary | ICD-10-CM | POA: Diagnosis not present

## 2022-08-18 DIAGNOSIS — T380X5A Adverse effect of glucocorticoids and synthetic analogues, initial encounter: Secondary | ICD-10-CM

## 2022-08-18 DIAGNOSIS — R7989 Other specified abnormal findings of blood chemistry: Secondary | ICD-10-CM

## 2022-08-18 DIAGNOSIS — M064 Inflammatory polyarthropathy: Secondary | ICD-10-CM

## 2022-08-18 MED ORDER — TIRZEPATIDE 2.5 MG/0.5ML ~~LOC~~ SOAJ
2.5000 mg | SUBCUTANEOUS | 2 refills | Status: DC
Start: 2022-08-18 — End: 2022-10-28

## 2022-08-18 MED ORDER — GABAPENTIN 300 MG PO CAPS
300.0000 mg | ORAL_CAPSULE | Freq: Two times a day (BID) | ORAL | 2 refills | Status: DC
Start: 2022-08-18 — End: 2022-11-20

## 2022-08-18 NOTE — Progress Notes (Signed)
[facility] [address] [place], [place] [address]  Internal MEDICINE  Office Visit Note  Patient Name: Summer Murphy  [id number]  [id number]  Date of Service: 08/18/2022  Chief Complaint  Patient presents with   Depression   Gastroesophageal Reflux   Hypertension   Follow-up    Discuss blood work from Dr. Sherene Sires.     HPI Summer Murphy presents for a follow-up visit to discuss lab results.  Dr. Sherene Sires ordered labs --here are the results, discussed with patient today Alpha 1 antitrypsin is normal Sed rate and IgE are normal BNP is normal CBC shows some normocytic normochromic anemia.  Elevated D-dimer which is nonspecific  Slightly elevated TSH but further testing is needed.  Has inflammatory arthritis in multiple joints, need to rule out autoimmune causes   Current Medication: Outpatient Encounter Medications as of 08/18/2022  Medication Sig   Accu-Chek Softclix Lancets lancets Use as instructed once a daily DX R730.01   Blood Glucose Monitoring Suppl (ACCU-CHEK GUIDE ME) w/Device KIT Use as directed once a day DX R730.01   budesonide (PULMICORT) 0.5 MG/2ML nebulizer solution Take twice daily in nebulizer   busPIRone (BUSPAR) 10 MG tablet Take 1 tablet (10 mg total) by mouth 2 (two) times daily.   clonazePAM (KLONOPIN) 1 MG tablet Take 0.5 tablets (0.5 mg total) by mouth 2 (two) times daily as needed for anxiety.   diclofenac Sodium (VOLTAREN) 1 % GEL Apply 4 g topically 4 (four) times daily.   diltiazem (CARTIA XT) 240 MG 24 hr capsule Take 1 capsule (240 mg total) by mouth daily.   DULoxetine (CYMBALTA) 60 MG capsule Take 1 capsule (60 mg total) by mouth at bedtime.   EPINEPHrine 0.3 mg/0.3 mL IJ SOAJ injection Inject 0.3 mg into the muscle as needed for anaphylaxis.   fluticasone (FLONASE) 50 MCG/ACT nasal spray Place 2 sprays into both nostrils daily as needed for rhinitis or allergies. Home med.   gabapentin (NEURONTIN) 300 MG capsule Take 1 capsule (300 mg  total) by mouth 2 (two) times daily.   gabapentin (NEURONTIN) 600 MG tablet Take 1 tablet (600 mg total) by mouth 2 (two) times daily.   glucose blood (ACCU-CHEK GUIDE) test strip Use as instructed once daily Dx R73.01   ibuprofen (ADVIL) 800 MG tablet Take 800 mg by mouth every 8 (eight) hours as needed for moderate pain.   Ipratropium-Albuterol (COMBIVENT RESPIMAT) 20-100 MCG/ACT AERS respimat INHALE 1 PUFF INTO THE LUNGS EVERY 6 HOURS   ipratropium-albuterol (DUONEB) 0.5-2.5 (3) MG/3ML SOLN Take 3 mLs by nebulization 4 (four) times daily.   montelukast (SINGULAIR) 10 MG tablet Take 1 tablet (10 mg total) by mouth at bedtime.   ondansetron (ZOFRAN) 4 MG tablet TAKE 1 TABLET BY MOUTH TWICE DAILY AS NEEDED   pantoprazole (PROTONIX) 40 MG tablet Take 40 mg by mouth 2 (two) times daily.   pilocarpine (SALAGEN) 5 MG tablet Take 5 mg by mouth 2 (two) times daily.   rOPINIRole (REQUIP) 0.5 MG tablet TAKE 2 TABLETS BY MOUTH TWICE DAILY FOR RESTLESS LEGS   sennosides-docusate sodium (SENOKOT-S) 8.6-50 MG tablet Take 2 tablets by mouth daily as needed for constipation. Home med   SUMAtriptan (IMITREX) 100 MG tablet Take 1 tablet (100 mg total) by mouth every 2 (two) hours as needed (for migraine headaches.). May repeat in 1 hours if headache persists or recurs.   traZODone (DESYREL) 150 MG tablet TAKE 1 TABLET(150 MG) BY MOUTH AT BEDTIME   [DISCONTINUED] tirzepatide (MOUNJARO) 2.5 MG/0.5ML  Pen Inject 2.5 mg into the skin once a week.   tirzepatide [facility]) 2.5 MG/0.5ML Pen Inject 2.5 mg into the skin once a week.   No facility-administered encounter medications on file as of 08/18/2022.    Surgical History: Past Surgical History:  Procedure Laterality Date   ABDOMINAL HYSTERECTOMY     AMPUTATION TOE Left 07/31/2016   Procedure: AMPUTATION TOE/MPJ 2nd toe;  Surgeon: Linus Galas, DPM;  Location: [facility] ORS;  Service: Podiatry;  Laterality: Left;   APPENDECTOMY  1990   BACK SURGERY     low back    BREAST SURGERY     bilateral breast reduction   CARDIAC ELECTROPHYSIOLOGY STUDY AND ABLATION     CHOLECYSTECTOMY  1990   COLONOSCOPY WITH PROPOFOL N/A 01/04/2017   Procedure: COLONOSCOPY WITH PROPOFOL;  Surgeon: Scot Jun, MD;  Location[facility] END[facility]COPY;  Service: Endoscopy;  Laterality: N/A;   CORNEAL TRANSPLANT     ESOPHAGOGASTRODUODENOSCOPY N/A 04/09/2021   Procedure: ESOPHAGOGASTRODUODENOSCOPY (EGD);  Surgeon: Toney Reil, MD;  Location: [facility] ENDOSCOPY;  Service: Gastroenterology;  Laterality: N/A;   ESOPHAGOGASTRODUODENOSCOPY (EGD) WITH PROPOFOL N/A 01/04/2017   Procedure: ESOPHAGOGASTRODUODENOSCOPY (EGD) WITH PROPOFOL;  Surgeon: Scot Jun, MD;  Location[facility] ENDOSCOPY;  Service: Endoscopy;  Laterality: N/A;   EXCISION BONE CYST Left 07/31/2016   Procedure: EXCISION BONE CYST/exostectomy 28124/left 2nd;  Surgeon: Linus Galas, DPM;  Location: [facility] ORS;  Service: Podiatry;  Laterality: Left;   EXTRACORPOREAL SHOCK WAVE LITHOTRIPSY Left 09/12/2015   Procedure: EXTRACORPOREAL SHOCK WAVE LITHOTRIPSY (ESWL);  Surgeon: Vanna Scotland, MD;  Location: [facility] ORS;  Service: Urology;  Laterality: Left;   FRACTURE SURGERY     left foot   HARDWARE REMOVAL Left 11/14/2020   Procedure: HARDWARE REMOVAL;  Surgeon: Kennedy Bucker, MD;  Location: [facility] ORS;  Service: Orthopedics;  Laterality: Left;   HH repair     Fundoplication   JOINT REPLACEMENT Bilateral 2013,2014   total knees   LAPAROSCOPIC HYSTERECTOMY     LITHOTRIPSY     periprosthetic supracondylar fracture of left femur  02/16/2020   [facility]   TONSILLECTOMY     TOTAL KNEE REVISION Right 11/14/2019   Procedure: Revision patella and tibial polyethylene;  Surgeon: Kennedy Bucker, MD;  Location: [facility] ORS;  Service: Orthopedics;  Laterality: Right;   URETEROSCOPY      Medical History: Past Medical History:  Diagnosis Date   Acid reflux    Anemia    Anxiety    Arrhythmia    treated with meds and has no current  problems   Arthritis    most uncomfortable in knees   Asthma    uses several inhalers   Depression    Fever blister    Hematuria    History of kidney stones    Hypertension    Hypoglycemia    Left flank pain    Migraine    Pre-diabetes    Restless leg    Yeast vaginitis     Family History: Family History  Problem Relation Age of Onset   Hypertension Mother    Stroke Mother    Prostate cancer Father    Kidney Stones Father    Diabetes Brother    Hypertension Brother    Breast cancer Maternal Aunt    Kidney disease Neg Hx     Social History   Socioeconomic History   Marital status: Widowed    Spouse name: Not on file   Number of children: Not on file   Years of  education: Not on file   Highest education level: Not on file  Occupational History   Not on file  Tobacco Use   Smoking status: Never    Passive exposure: Past   Smokeless tobacco: Never  Vaping Use   Vaping Use: Never used  Substance and Sexual Activity   Alcohol use: No   Drug use: No   Sexual activity: Not on file  Other Topics Concern   Not on file  Social History Narrative   Not on file   Social Determinants of Health   Financial Resource Strain: Not on file  Food Insecurity: No Food Insecurity (06/02/2022)   Hunger Vital Sign    Worried About Running Out of Food in the Last Year: Never true    Ran Out of Food in the Last Year: Never true  Transportation Needs: No Transportation Needs (06/02/2022)   PRAPARE - Administrator, Civil Service (Medical): No    Lack of Transportation (Non-Medical): No  Physical Activity: Not on file  Stress: Not on file  Social Connections: Not on file  Intimate Partner Violence: Not At Risk (06/02/2022)   Humiliation, Afraid, Rape, and Kick questionnaire    Fear of Current or Ex-Partner: No    Emotionally Abused: No    Physically Abused: No    Sexually Abused: No      Review of Systems  Constitutional:  Negative for fatigue and fever.   HENT:  Negative for congestion, mouth sores and postnasal drip.   Respiratory:  Negative for cough.   Cardiovascular:  Negative for chest pain.  Genitourinary:  Negative for flank pain.  Psychiatric/Behavioral: Negative.      Vital Signs: BP (!) 146/82   Pulse 98   Temp 98.5 F (36.9 C)   Resp 16   Ht 5\' 3"  (1.6 m)   Wt 160 lb (72.6 kg)   SpO2 95%   BMI 28.34 kg/m    Physical Exam Vitals reviewed.  Constitutional:      Appearance: Normal appearance.  HENT:     Head: Normocephalic and atraumatic.     Nose: Nose normal.     Mouth/Throat:     Mouth: Mucous membranes are moist.     Pharynx: No posterior oropharyngeal erythema.  Eyes:     Extraocular Movements: Extraocular movements intact.     Pupils: Pupils are equal, round, and reactive to light.  Cardiovascular:     Pulses: Normal pulses.     Heart sounds: Normal heart sounds.  Pulmonary:     Effort: Pulmonary effort is normal.     Breath sounds: Normal breath sounds.  Neurological:     General: No focal deficit present.     Mental Status: She is alert.  Psychiatric:        Mood and Affect: Mood normal.        Behavior: Behavior normal.        Assessment/Plan: 1. Diabetes mellitus without complication (HCC) Continue gabapentin as prescribed. Mounjaro prescribed. Follow up in 6 weeks  - gabapentin (NEURONTIN) 300 MG capsule; Take 1 capsule (300 mg total) by mouth 2 (two) times daily.  Dispense: 60 capsule; Refill: 2 - tirzepatide (MOUNJARO) 2.5 MG/0.5ML Pen; Inject 2.5 mg into the skin once a week.  Dispense: 2 mL; Refill: 2  2. Steroid-induced hyperglycemia Mounjaro prescribed, follow up in 6 weeks  - tirzepatide [facility]) 2.5 MG/0.5ML Pen; Inject 2.5 mg into the skin once a week.  Dispense: 2 mL; Refill: 2  3.  Anemia, unspecified type Additional labs to evaluate anemia ordered, have labs drawn in 1 month - CBC with Differential/Platelet - Iron, TIBC and Ferritin Panel - B12 and Folate Panel  4.  Hypocalcemia Repeat calcium level in a few weeks  - Calcium  5. Elevated TSH Repeat thyroid labs  - TSH + free T4  6. Inflammatory polyarthropathy (HCC) Continue gabapentin as prescribed.  Labs ordered to rule out autoimmune causes.  - gabapentin (NEURONTIN) 300 MG capsule; Take 1 capsule (300 mg total) by mouth 2 (two) times daily.  Dispense: 60 capsule; Refill: 2 - Rheumatoid Factor - C-reactive protein - ANA Direct w/Reflex if Positive   General Counseling: Prisca verbalizes understanding of the findings of todays visit and agrees with plan of treatment. I have discussed any further diagnostic evaluation that may be needed or ordered today. We also reviewed her medications today. she has been encouraged to call the office with any questions or concerns that should arise related to todays visit.    Orders Placed This Encounter  Procedures   CBC with Differential/Platelet   Iron, TIBC and Ferritin Panel   B12 and Folate Panel   TSH + free T4   Calcium   Rheumatoid Factor   C-reactive protein   ANA Direct w/Reflex if Positive    Meds ordered this encounter  Medications   gabapentin (NEURONTIN) 300 MG capsule    Sig: Take 1 capsule (300 mg total) by mouth 2 (two) times daily.    Dispense:  60 capsule    Refill:  2    She will take this in addition to her 600 mg gabapentin prescription.   tirzepatide [facility]) 2.5 MG/0.5ML Pen    Sig: Inject 2.5 mg into the skin once a week.    Dispense:  2 mL    Refill:  2    Dx code E11.65    Return for previously scheduled, F/U, Madalina Rosman PCP in july.   Total time spent:30 Minutes Time spent includes review of chart, medications, test results, and follow up plan with the patient.   [place] Controlled Substance Database was reviewed by me.  This patient was seen by [name], FNP-C in collaboration with Dr. Beverely Risen as a part of collaborative care agreement.   Anntoinette Haefele R. Tedd Sias, MSN, FNP-C Internal medicine

## 2022-08-19 NOTE — Telephone Encounter (Signed)
Done

## 2022-08-28 ENCOUNTER — Telehealth: Payer: Self-pay | Admitting: Internal Medicine

## 2022-08-28 ENCOUNTER — Other Ambulatory Visit: Payer: Self-pay | Admitting: Internal Medicine

## 2022-08-28 ENCOUNTER — Telehealth: Payer: Self-pay | Admitting: Nurse Practitioner

## 2022-08-28 DIAGNOSIS — R1310 Dysphagia, unspecified: Secondary | ICD-10-CM

## 2022-08-28 DIAGNOSIS — K449 Diaphragmatic hernia without obstruction or gangrene: Secondary | ICD-10-CM

## 2022-08-28 NOTE — Telephone Encounter (Signed)
done

## 2022-08-28 NOTE — Telephone Encounter (Signed)
Gastroenterology referral faxed to [facility] per patient request; [phone]

## 2022-08-29 ENCOUNTER — Encounter: Payer: Self-pay | Admitting: Nurse Practitioner

## 2022-09-01 NOTE — Telephone Encounter (Signed)
Error

## 2022-09-04 ENCOUNTER — Other Ambulatory Visit: Payer: Self-pay | Admitting: Internal Medicine

## 2022-09-04 DIAGNOSIS — I10 Essential (primary) hypertension: Secondary | ICD-10-CM

## 2022-09-10 ENCOUNTER — Ambulatory Visit (INDEPENDENT_AMBULATORY_CARE_PROVIDER_SITE_OTHER): Payer: Self-pay | Admitting: Nurse Practitioner

## 2022-09-10 ENCOUNTER — Encounter: Payer: Self-pay | Admitting: Nurse Practitioner

## 2022-09-10 VITALS — BP 130/72 | HR 95 | Temp 98.6°F | Resp 16 | Ht 63.0 in | Wt 159.0 lb

## 2022-09-10 DIAGNOSIS — J329 Chronic sinusitis, unspecified: Secondary | ICD-10-CM | POA: Diagnosis not present

## 2022-09-10 MED ORDER — PREDNISONE 10 MG (21) PO TBPK
ORAL_TABLET | ORAL | 0 refills | Status: DC
Start: 2022-09-10 — End: 2022-10-28

## 2022-09-10 MED ORDER — LEVOFLOXACIN 500 MG PO TABS
500.0000 mg | ORAL_TABLET | Freq: Every day | ORAL | 0 refills | Status: AC
Start: 2022-09-10 — End: 2022-09-17

## 2022-09-10 NOTE — Progress Notes (Signed)
[facility] [address] [place], [place] [address]  Internal MEDICINE  Office Visit Note  Patient Name: Summer Murphy  [id number]  [id number]  Date of Service: 09/10/2022  Chief Complaint  Patient presents with   Acute Visit    Coughing up yellow-- covid test negative, wheezing     HPI [name] presents for an acute sick visit for cough, wheezing and sinus problems  --symptoms worsened this morning and started coughing up yellow sputum.  --tried delsym cough syrup, allergy nasal spray.  Negative for covid Reports nasal congestion, cough, runny nose, fatigue, post nasal drip, wheezing, chest tightness and SOB.     Current Medication:  Outpatient Encounter Medications as of 09/10/2022  Medication Sig   Accu-Chek Softclix Lancets lancets Use as instructed once a daily DX R730.01   Blood Glucose Monitoring Suppl (ACCU-CHEK GUIDE ME) w/Device KIT Use as directed once a day DX R730.01   budesonide (PULMICORT) 0.5 MG/2ML nebulizer solution Take twice daily in nebulizer   busPIRone (BUSPAR) 10 MG tablet Take 1 tablet (10 mg total) by mouth 2 (two) times daily.   clonazePAM (KLONOPIN) 1 MG tablet Take 0.5 tablets (0.5 mg total) by mouth 2 (two) times daily as needed for anxiety.   diclofenac Sodium (VOLTAREN) 1 % GEL Apply 4 g topically 4 (four) times daily.   diltiazem (CARDIZEM CD) 240 MG 24 hr capsule TAKE ONE CAPSULE BY MOUTH DAILY   DULoxetine (CYMBALTA) 60 MG capsule Take 1 capsule (60 mg total) by mouth at bedtime.   EPINEPHrine 0.3 mg/0.3 mL IJ SOAJ injection Inject 0.3 mg into the muscle as needed for anaphylaxis.   fluticasone (FLONASE) 50 MCG/ACT nasal spray Place 2 sprays into both nostrils daily as needed for rhinitis or allergies. Home med.   gabapentin (NEURONTIN) 300 MG capsule Take 1 capsule (300 mg total) by mouth 2 (two) times daily.   gabapentin (NEURONTIN) 600 MG tablet Take 1 tablet (600 mg total) by mouth 2 (two) times daily.   glucose blood  (ACCU-CHEK GUIDE) test strip Use as instructed once daily Dx R73.01   ibuprofen (ADVIL) 800 MG tablet Take 800 mg by mouth every 8 (eight) hours as needed for moderate pain.   Ipratropium-Albuterol (COMBIVENT RESPIMAT) 20-100 MCG/ACT AERS respimat INHALE 1 PUFF INTO THE LUNGS EVERY 6 HOURS   ipratropium-albuterol (DUONEB) 0.5-2.5 (3) MG/3ML SOLN Take 3 mLs by nebulization 4 (four) times daily.   levofloxacin (LEVAQUIN) 500 MG tablet Take 1 tablet (500 mg total) by mouth daily for 7 days.   montelukast (SINGULAIR) 10 MG tablet Take 1 tablet (10 mg total) by mouth at bedtime.   ondansetron (ZOFRAN) 4 MG tablet TAKE 1 TABLET BY MOUTH TWICE DAILY AS NEEDED   pantoprazole (PROTONIX) 40 MG tablet Take 40 mg by mouth 2 (two) times daily.   pilocarpine (SALAGEN) 5 MG tablet Take 5 mg by mouth 2 (two) times daily.   predniSONE (STERAPRED UNI-PAK 21 TAB) 10 MG (21) TBPK tablet Use as directed for 6 days   rOPINIRole (REQUIP) 0.5 MG tablet TAKE 2 TABLETS BY MOUTH TWICE DAILY FOR RESTLESS LEGS   sennosides-docusate sodium (SENOKOT-S) 8.6-50 MG tablet Take 2 tablets by mouth daily as needed for constipation. Home med   SUMAtriptan (IMITREX) 100 MG tablet Take 1 tablet (100 mg total) by mouth every 2 (two) hours as needed (for migraine headaches.). May repeat in 1 hours if headache persists or recurs.   tirzepatide [facility]) 2.5 MG/0.5ML Pen Inject 2.5 mg into the skin  once a week.   traZODone (DESYREL) 150 MG tablet TAKE 1 TABLET(150 MG) BY MOUTH AT BEDTIME   No facility-administered encounter medications on file as of 09/10/2022.      Medical History: Past Medical History:  Diagnosis Date   Acid reflux    Anemia    Anxiety    Arrhythmia    treated with meds and has no current problems   Arthritis    most uncomfortable in knees   Asthma    uses several inhalers   Depression    Fever blister    Hematuria    History of kidney stones    Hypertension    Hypoglycemia    Left flank pain     Migraine    Pre-diabetes    Restless leg    Yeast vaginitis      Vital Signs: BP 130/72   Pulse 95   Temp 98.6 F (37 C)   Resp 16   Ht 5\' 3"  (1.6 m)   Wt 159 lb (72.1 kg)   SpO2 97%   BMI 28.17 kg/m    Review of Systems  Constitutional:  Positive for fatigue.  HENT:  Positive for congestion, ear pain, postnasal drip and rhinorrhea. Negative for sinus pressure, sneezing and sore throat.   Respiratory:  Positive for cough, chest tightness, shortness of breath and wheezing.   Cardiovascular: Negative.  Negative for chest pain and palpitations.  Gastrointestinal: Negative.   Neurological:  Negative for headaches.    Physical Exam Vitals reviewed.  HENT:     Head: Normocephalic and atraumatic.     Right Ear: No swelling.     Left Ear: Swelling present.     Nose: Mucosal edema, congestion and rhinorrhea present.     Right Turbinates: Swollen and pale.     Left Turbinates: Swollen and pale.     Right Sinus: Maxillary sinus tenderness present. No frontal sinus tenderness.     Left Sinus: Maxillary sinus tenderness present. No frontal sinus tenderness.     Mouth/Throat:     Lips: Pink.     Mouth: Mucous membranes are moist.     Pharynx: Uvula midline. Posterior oropharyngeal erythema present.  Cardiovascular:     Rate and Rhythm: Normal rate and regular rhythm.  Pulmonary:     Effort: Tachypnea and accessory muscle usage present. No respiratory distress.     Breath sounds: Decreased air movement present. Examination of the right-upper field reveals decreased breath sounds and wheezing. Examination of the left-upper field reveals decreased breath sounds and wheezing. Examination of the right-middle field reveals decreased breath sounds. Examination of the left-middle field reveals decreased breath sounds. Examination of the right-lower field reveals decreased breath sounds. Examination of the left-lower field reveals decreased breath sounds. Decreased breath sounds and wheezing  present.  Neurological:     Mental Status: She is alert.       Assessment/Plan: 1. Recurrent sinusitis Prescribed levofloxacin and prednisone taper, call the clinic if symptoms worsen or the OTC delsym stops alleviating the cough  - levofloxacin (LEVAQUIN) 500 MG tablet; Take 1 tablet (500 mg total) by mouth daily for 7 days.  Dispense: 7 tablet; Refill: 0 - predniSONE (STERAPRED UNI-PAK 21 TAB) 10 MG (21) TBPK tablet; Use as directed for 6 days  Dispense: 21 tablet; Refill: 0   General Counseling: Mariyah verbalizes understanding of the findings of todays visit and agrees with plan of treatment. I have discussed any further diagnostic evaluation that may be needed or ordered  today. We also reviewed her medications today. she has been encouraged to call the office with any questions or concerns that should arise related to todays visit.    Counseling:    No orders of the defined types were placed in this encounter.   Meds ordered this encounter  Medications   levofloxacin (LEVAQUIN) 500 MG tablet    Sig: Take 1 tablet (500 mg total) by mouth daily for 7 days.    Dispense:  7 tablet    Refill:  0   predniSONE (STERAPRED UNI-PAK 21 TAB) 10 MG (21) TBPK tablet    Sig: Use as directed for 6 days    Dispense:  21 tablet    Refill:  0    Return for keep routine appts, follow up as needed for problem today.   [place] Controlled Substance Database was reviewed by me for overdose risk score (ORS)  Time spent:20 Minutes Time spent with patient included reviewing progress notes, labs, imaging studies, and discussing plan for follow up.   This patient was seen by [name], FNP-C in collaboration with Dr. Beverely Risen as a part of collaborative care agreement.  Ingvald Theisen R. Tedd Sias, MSN, FNP-C Internal Medicine

## 2022-09-12 ENCOUNTER — Telehealth: Payer: Self-pay | Admitting: Pulmonary Disease

## 2022-09-12 NOTE — Telephone Encounter (Signed)
Patient towards history of asthma.  Worsening congestion, shortness of breath.  Seen by PCP 48 hours ago.  Prescribed antibiotics and steroids.  She feels like things not improving.  I discussed that in terms of adding medications there is not much more to add.  Assuming asthma is the correct issue driving her current symptoms, then we need time for prednisone and antibiotics to work.  Things are working or getting worse she needs to go to the emergency room for evaluation due to failure of outpatient treatment.  I advised her to increase her DuoNeb treatments every 2 hours this evening hopefully this will start the process of improving will give a bit more time for antibiotics and steroids to help.  If she were to worsen again, advised her to present to the ED.  Oxygen saturation while talking on the phone was between 97 to 98% speaking in full sentences.  Can we please call and check on patient Monday morning and offer a office visit?

## 2022-09-14 NOTE — Telephone Encounter (Signed)
Spoke to pt to check on her and reiterate Dr [employer]. Pt currently taking antibiotics and prednisone but still feels short of breath, pt states her O2 is 93% but she was talking with full sentences and she is taking her nebulizer treatments as Dr Judeth Horn suggested. Also as Dr Judeth Horn suggested I gave her an appt with Rhunette Croft, NP 09/16/2022. Pt verbalized understanding. Nothing further needed at this time.

## 2022-09-16 ENCOUNTER — Ambulatory Visit: Payer: 59 | Admitting: Nurse Practitioner

## 2022-09-16 ENCOUNTER — Ambulatory Visit
Admission: RE | Admit: 2022-09-16 | Discharge: 2022-09-16 | Disposition: A | Discharge status: Home / Self Care | Payer: 59 | Attending: Student in an Organized Health Care Education/Training Program | Admitting: Student in an Organized Health Care Education/Training Program

## 2022-09-16 ENCOUNTER — Ambulatory Visit
Admission: RE | Admit: 2022-09-16 | Discharge: 2022-09-16 | Disposition: A | Discharge status: Home / Self Care | Payer: 59 | Source: Ambulatory Visit | Attending: Student in an Organized Health Care Education/Training Program | Admitting: Student in an Organized Health Care Education/Training Program

## 2022-09-16 ENCOUNTER — Ambulatory Visit (INDEPENDENT_AMBULATORY_CARE_PROVIDER_SITE_OTHER): Payer: 59 | Admitting: Student in an Organized Health Care Education/Training Program

## 2022-09-16 ENCOUNTER — Encounter: Payer: Self-pay | Admitting: Student in an Organized Health Care Education/Training Program

## 2022-09-16 VITALS — BP 120/60 | HR 110 | Temp 97.7°F | Ht 63.0 in | Wt 155.8 lb

## 2022-09-16 DIAGNOSIS — J4541 Moderate persistent asthma with (acute) exacerbation: Secondary | ICD-10-CM

## 2022-09-16 LAB — NITRIC OXIDE: Nitric Oxide: 6

## 2022-09-16 MED ORDER — FLUTICASONE-SALMETEROL 500-50 MCG/ACT IN AEPB
1.0000 | INHALATION_SPRAY | Freq: Two times a day (BID) | RESPIRATORY_TRACT | 12 refills | Status: AC
Start: 2022-09-16 — End: ?

## 2022-09-16 MED ORDER — FLUTICASONE PROPIONATE 50 MCG/ACT NA SUSP
1.0000 | Freq: Every day | NASAL | 2 refills | Status: DC
Start: 2022-09-16 — End: 2023-03-29

## 2022-09-16 NOTE — Progress Notes (Signed)
Assessment & Plan:   1. Moderate persistent asthma with acute exacerbation  Patient with a history of asthma and is presenting for the evaluation of shortness of breath, consistent with an asthma exacerbation. She was recently started on a course of prednisone. Her FENO today was low at 6 ppb (on steroids). She has end expiratory wheezes during today's visit. Reviewed her dispense report, she has had multiple courses of prednisone (10 over the past 6 months) consistent with poorly controlled asthma. She is maintained on nebulized budesonide twice daily.  I will step up inhaler therapy to high dose Advair, 500-50 to better achieve asthma control. I will also add intranasal corticosteroids to her regimen and get a chest xray to rule out pneumonia today. Should her asthma remain uncontrolled, I would recommend next step in therapy be the initiation of biologics. She has a history of eosinophilia (max eos was 800 ten years ago), no IgE elevation. Options could include dupilumab, tezepelumab, and mepolizumab.  - fluticasone-salmeterol (ADVAIR DISKUS) 500-50 MCG/ACT AEPB; Inhale 1 puff into the lungs in the morning and at bedtime.  Dispense: 60 each; Refill: 12 - fluticasone (FLONASE) 50 MCG/ACT nasal spray; Place 1 spray into both nostrils daily.  Dispense: 18.2 mL; Refill: 2 - DG Chest 2 View; Future   No follow-ups on file.  I spent 30 minutes caring for this patient today, including preparing to see the patient, obtaining a medical history , reviewing a separately obtained history, performing a medically appropriate examination and/or evaluation, counseling and educating the patient/family/caregiver, ordering medications, tests, or procedures, documenting clinical information in the electronic health record, and independently interpreting results (not separately reported/billed) and communicating results to the patient/family/caregiver  Raechel Chute, MD [facility] 09/16/2022 9:09 AM    End of visit medications:  Meds ordered this encounter  Medications   fluticasone-salmeterol (ADVAIR DISKUS) 500-50 MCG/ACT AEPB    Sig: Inhale 1 puff into the lungs in the morning and at bedtime.    Dispense:  60 each    Refill:  12   fluticasone (FLONASE) 50 MCG/ACT nasal spray    Sig: Place 1 spray into both nostrils daily.    Dispense:  18.2 mL    Refill:  2     Current Outpatient Medications:    Accu-Chek Softclix Lancets lancets, Use as instructed once a daily DX R730.01, Disp: 100 each, Rfl: 12   Blood Glucose Monitoring Suppl (ACCU-CHEK GUIDE ME) w/Device KIT, Use as directed once a day DX R730.01, Disp: 1 kit, Rfl: 0   busPIRone (BUSPAR) 10 MG tablet, Take 1 tablet (10 mg total) by mouth 2 (two) times daily., Disp: 60 tablet, Rfl: 2   clonazePAM (KLONOPIN) 1 MG tablet, Take 0.5 tablets (0.5 mg total) by mouth 2 (two) times daily as needed for anxiety., Disp: 30 tablet, Rfl: 2   diclofenac Sodium (VOLTAREN) 1 % GEL, Apply 4 g topically 4 (four) times daily., Disp: 350 g, Rfl: 1   diltiazem (CARDIZEM CD) 240 MG 24 hr capsule, TAKE ONE CAPSULE BY MOUTH DAILY, Disp: 90 capsule, Rfl: 3   DULoxetine (CYMBALTA) 60 MG capsule, Take 1 capsule (60 mg total) by mouth at bedtime., Disp: 90 capsule, Rfl: 1   EPINEPHrine 0.3 mg/0.3 mL IJ SOAJ injection, Inject 0.3 mg into the muscle as needed for anaphylaxis., Disp: 1 each, Rfl: 3   fluticasone (FLONASE) 50 MCG/ACT nasal spray, Place 1 spray into both nostrils daily., Disp: 18.2 mL, Rfl: 2   fluticasone-salmeterol (  ADVAIR DISKUS) 500-50 MCG/ACT AEPB, Inhale 1 puff into the lungs in the morning and at bedtime., Disp: 60 each, Rfl: 12   gabapentin (NEURONTIN) 300 MG capsule, Take 1 capsule (300 mg total) by mouth 2 (two) times daily., Disp: 60 capsule, Rfl: 2   gabapentin (NEURONTIN) 600 MG tablet, Take 1 tablet (600 mg total) by mouth 2 (two) times daily., Disp: 60 tablet, Rfl: 3   glucose blood (ACCU-CHEK GUIDE)  test strip, Use as instructed once daily Dx R73.01, Disp: 100 each, Rfl: 12   ibuprofen (ADVIL) 800 MG tablet, Take 800 mg by mouth every 8 (eight) hours as needed for moderate pain., Disp: , Rfl:    Ipratropium-Albuterol (COMBIVENT RESPIMAT) 20-100 MCG/ACT AERS respimat, INHALE 1 PUFF INTO THE LUNGS EVERY 6 HOURS, Disp: 4 g, Rfl: 3   ipratropium-albuterol (DUONEB) 0.5-2.5 (3) MG/3ML SOLN, Take 3 mLs by nebulization 4 (four) times daily., Disp: 360 mL, Rfl: 11   montelukast (SINGULAIR) 10 MG tablet, Take 1 tablet (10 mg total) by mouth at bedtime., Disp: 90 tablet, Rfl: 1   ondansetron (ZOFRAN) 4 MG tablet, TAKE 1 TABLET BY MOUTH TWICE DAILY AS NEEDED, Disp: 45 tablet, Rfl: 1   pantoprazole (PROTONIX) 40 MG tablet, Take 40 mg by mouth 2 (two) times daily., Disp: , Rfl:    pilocarpine (SALAGEN) 5 MG tablet, Take 5 mg by mouth 2 (two) times daily., Disp: , Rfl:    rOPINIRole (REQUIP) 0.5 MG tablet, TAKE 2 TABLETS BY MOUTH TWICE DAILY FOR RESTLESS LEGS, Disp: 360 tablet, Rfl: 1   sennosides-docusate sodium (SENOKOT-S) 8.6-50 MG tablet, Take 2 tablets by mouth daily as needed for constipation. Home med, Disp: , Rfl:    SUMAtriptan (IMITREX) 100 MG tablet, Take 1 tablet (100 mg total) by mouth every 2 (two) hours as needed (for migraine headaches.). May repeat in 1 hours if headache persists or recurs., Disp: 10 tablet, Rfl: 3   tirzepatide (MOUNJARO) 2.5 MG/0.5ML Pen, Inject 2.5 mg into the skin once a week., Disp: 2 mL, Rfl: 2   traZODone (DESYREL) 150 MG tablet, TAKE 1 TABLET(150 MG) BY MOUTH AT BEDTIME, Disp: 30 tablet, Rfl: 2   levofloxacin (LEVAQUIN) 500 MG tablet, Take 1 tablet (500 mg total) by mouth daily for 7 days. (Patient not taking: Reported on 09/16/2022), Disp: 7 tablet, Rfl: 0   predniSONE (STERAPRED UNI-PAK 21 TAB) 10 MG (21) TBPK tablet, Use as directed for 6 days (Patient not taking: Reported on 09/16/2022), Disp: 21 tablet, Rfl: 0   Subjective:   PATIENT ID: Summer Murphy GENDER:  female DOB: Feb 05, 1956, MRN: [id number]  Chief Complaint  Patient presents with   Acute Visit    Took last dose of prednisone and Levaquin today. Increased SOB with exertion, prod cough with yellow sputum and wheezing.     HPI  Patient is a pleasant 67 year old female with a past medical history of poorly controlled asthma presenting for acute visit.  Patient had established care in our clinic with Dr. Sherene Sires for asthma. She's had a long standing history of asthma, with previous PFT's showing obstruction (scooping on flow volume loop and FEV1 of 60% predicted - normal lung volumes, normal DLCO) who is maintained on twice daily nebulized budesonide. She's also using combivent and albuterol nebulizers as needed. She reports that her symptoms acted up last week with increased wheezing and shortness of breath. She was seen at her PCP's office and prescribed prednisone and antibiotics.   Today, she reports persistent symptoms  of shortness of breath, wheeze, and cough. This is somewhat improved compared to last week, but persists. Reviewed her medications and she reports compliance. No fevers, chills, night sweats, or weight loss reported  Ancillary information including prior medications, full medical/surgical/family/social histories, and PFTs (when available) are listed below and have been reviewed.   Review of Systems  Constitutional:  Negative for chills, fever and weight loss.  Respiratory:  Positive for cough, shortness of breath and wheezing.      Objective:   Vitals:   09/16/22 0858  BP: 120/60  Pulse: (!) 110  Temp: 97.7 F (36.5 C)  TempSrc: Temporal  SpO2: 96%  Weight: 155 lb 12.8 oz (70.7 kg)  Height: 5\' 3"  (1.6 m)   96% on RA BMI Readings from Last 3 Encounters:  09/16/22 27.60 kg/m  09/10/22 28.17 kg/m  08/18/22 28.34 kg/m   Wt Readings from Last 3 Encounters:  09/16/22 155 lb 12.8 oz (70.7 kg)  09/10/22 159 lb (72.1 kg)  08/18/22 160 lb (72.6 kg)    Physical  Exam Constitutional:      Appearance: Normal appearance. She is obese. She is not ill-appearing.  Pulmonary:     Effort: Pulmonary effort is normal.     Breath sounds: Wheezing and rhonchi present. No rales.  Abdominal:     Palpations: Abdomen is soft.  Neurological:     General: No focal deficit present.     Mental Status: She is alert and oriented to person, place, and time. Mental status is at baseline.       Ancillary Information    Past Medical History:  Diagnosis Date   Acid reflux    Anemia    Anxiety    Arrhythmia    treated with meds and has no current problems   Arthritis    most uncomfortable in knees   Asthma    uses several inhalers   Depression    Fever blister    Hematuria    History of kidney stones    Hypertension    Hypoglycemia    Left flank pain    Migraine    Pre-diabetes    Restless leg    Yeast vaginitis      Family History  Problem Relation Age of Onset   Hypertension Mother    Stroke Mother    Prostate cancer Father    Kidney Stones Father    Diabetes Brother    Hypertension Brother    Breast cancer Maternal Aunt    Kidney disease Neg Hx      Past Surgical History:  Procedure Laterality Date   ABDOMINAL HYSTERECTOMY     AMPUTATION TOE Left 07/31/2016   Procedure: AMPUTATION TOE/MPJ 2nd toe;  Surgeon: Linus Galas, DPM;  Location: [facility] ORS;  Service: Podiatry;  Laterality: Left;   APPENDECTOMY  1990   BACK SURGERY     low back   BREAST SURGERY     bilateral breast reduction   CARDIAC ELECTROPHYSIOLOGY STUDY AND ABLATION     CHOLECYSTECTOMY  1990   COLONOSCOPY WITH PROPOFOL N/A 01/04/2017   Procedure: COLONOSCOPY WITH PROPOFOL;  Surgeon: Scot Jun, MD;  Location: [facility] ENDOSCOPY;  Service: Endoscopy;  Laterality: N/A;   CORNEAL TRANSPLANT     ESOPHAGOGASTRODUODENOSCOPY N/A 04/09/2021   Procedure: ESOPHAGOGASTRODUODENOSCOPY (EGD);  Surgeon: Toney Reil, MD;  Location: [facility] ENDOSCOPY;  Service: Gastroenterology;   Laterality: N/A;   ESOPHAGOGASTRODUODENOSCOPY (EGD) WITH PROPOFOL N/A 01/04/2017   Procedure: ESOPHAGOGASTRODUODENOSCOPY (EGD) WITH PROPOFOL;  Surgeon:  Scot Jun, MD;  Location: [facility] ENDOSCOPY;  Service: Endoscopy;  Laterality: N/A;   EXCISION BONE CYST Left 07/31/2016   Procedure: EXCISION BONE CYST/exostectomy 28124/left 2nd;  Surgeon: Linus Galas, DPM;  Location: [facility];  Service: Podiatry;  Laterality: Left;   EXTRACORPOREAL SHOCK WAVE LITHOTRIPSY Left 09/12/2015   Procedure: EXTRACORPOREAL SHOCK WAVE LITHOTRIPSY (ESWL);  Surgeon: Vanna Scotland, MD;  Location: [facility] OR[facility];  Service: Urology;  Laterality: Left;   FRACTURE SURGERY     left foot   HARDWARE REMOVAL Left 11/14/2020   Procedure: HARDWARE REMOVAL;  Surgeon: Kennedy Bucker, MD;  Location: [facility] ORS;  Service: Orthopedics;  Laterality: Left;   HH repair     Fundoplication   JOINT REPLACEMENT Bilateral 2013,2014   total knees   LAPAROSCOPIC HYSTERECTOMY     LITHOTRIPSY     periprosthetic supracondylar fracture of left femur  02/16/2020   [facility]   TONSILLECTOMY     TOTAL KNEE REVISION Right 11/14/2019   Procedure: Revision patella and tibial polyethylene;  Surgeon: Kennedy Bucker, MD;  Location: [facility] ORS;  Service: Orthopedics;  Laterality: Right;   URETEROSCOPY      Social History   Socioeconomic History   Marital status: Widowed    Spouse name: Not on file   Number of children: Not on file   Years of education: Not on file   Highest education level: Not on file  Occupational History   Not on file  Tobacco Use   Smoking status: Never    Passive exposure: Past   Smokeless tobacco: Never  Vaping Use   Vaping Use: Never used  Substance and Sexual Activity   Alcohol use: No   Drug use: No   Sexual activity: Not on file  Other Topics Concern   Not on file  Social History Narrative   Not on file   Social Determinants of Health   Financial Resource Strain: Not on file  Food Insecurity: No Food  Insecurity (06/02/2022)   Hunger Vital Sign    Worried About Running Out of Food in the Last Year: Never true    Ran Out of Food in the Last Year: Never true  Transportation Needs: No Transportation Needs (06/02/2022)   PRAPARE - Administrator, Civil Service (Medical): No    Lack of Transportation (Non-Medical): No  Physical Activity: Not on file  Stress: Not on file  Social Connections: Not on file  Intimate Partner Violence: Not At Risk (06/02/2022)   Humiliation, Afraid, Rape, and Kick questionnaire    Fear of Current or Ex-Partner: No    Emotionally Abused: No    Physically Abused: No    Sexually Abused: No     Allergies  Allergen Reactions   Librium [Chlordiazepoxide] Shortness Of Breath   Metoclopramide Hives and Other (See Comments)    hallucinations    Vanilla Shortness Of Breath    Pt reports allergy to vanilla extract only   Aspirin Hives   Nsaids Rash    Rash/flares asthma issues.   Azithromycin Other (See Comments)    Unsure of what the reaction was.   Buprenorphine Hcl Other (See Comments)    Unsure of what the reaction was.    Chlordiazepoxide Hcl Other (See Comments)    unsure   Morphine Other (See Comments)    Unsure of reaction   Sulfa Antibiotics     Other reaction(s): Unknown   Tolmetin     Other Reaction: Allergy   Amlodipine Besylate Itching and  Rash    arms, stomach and forehead   Iron Nausea And Vomiting    Other reaction(s): Unknown     CBC    Component Value Date/Time   WBC 8.7 08/07/2022 1456   RBC 3.78 (L) 08/07/2022 1456   HGB 10.6 (L) 08/07/2022 1456   HGB 13.4 08/29/2021 1303   HCT 32.9 (L) 08/07/2022 1456   HCT 39.4 08/29/2021 1303   PLT 266 08/07/2022 1456   PLT 186 08/29/2021 1303   MCV 87.0 08/07/2022 1456   MCV 91 08/29/2021 1303   MCV 84 06/28/2014 1710   MCH 28.0 08/07/2022 1456   MCHC 32.2 08/07/2022 1456   RDW 13.9 08/07/2022 1456   RDW 12.6 08/29/2021 1303   RDW 16.7 (H) 06/28/2014 1710   LYMPHSABS  1.9 08/07/2022 1456   LYMPHSABS 2.0 08/29/2021 1303   LYMPHSABS 0.6 (L) 06/13/2013 0505   MONOABS 0.6 08/07/2022 1456   MONOABS 0.1 (L) 06/13/2013 0505   EOSABS 0.3 08/07/2022 1456   EOSABS 0.2 08/29/2021 1303   EOSABS 0.0 06/13/2013 0505   BASOSABS 0.1 08/07/2022 1456   BASOSABS 0.0 08/29/2021 1303   BASOSABS 0.0 06/13/2013 0505    Pulmonary Functions Testing Results:     No data to display          Outpatient Medications Prior to Visit  Medication Sig Dispense Refill   Accu-Chek Softclix Lancets lancets Use as instructed once a daily DX R730.01 100 each 12   Blood Glucose Monitoring Suppl (ACCU-CHEK GUIDE ME) w/Device KIT Use as directed once a day DX R730.01 1 kit 0   busPIRone (BUSPAR) 10 MG tablet Take 1 tablet (10 mg total) by mouth 2 (two) times daily. 60 tablet 2   clonazePAM (KLONOPIN) 1 MG tablet Take 0.5 tablets (0.5 mg total) by mouth 2 (two) times daily as needed for anxiety. 30 tablet 2   diclofenac Sodium (VOLTAREN) 1 % GEL Apply 4 g topically 4 (four) times daily. 350 g 1   diltiazem (CARDIZEM CD) 240 MG 24 hr capsule TAKE ONE CAPSULE BY MOUTH DAILY 90 capsule 3   DULoxetine (CYMBALTA) 60 MG capsule Take 1 capsule (60 mg total) by mouth at bedtime. 90 capsule 1   EPINEPHrine 0.3 mg/0.3 mL IJ SOAJ injection Inject 0.3 mg into the muscle as needed for anaphylaxis. 1 each 3   gabapentin (NEURONTIN) 300 MG capsule Take 1 capsule (300 mg total) by mouth 2 (two) times daily. 60 capsule 2   gabapentin (NEURONTIN) 600 MG tablet Take 1 tablet (600 mg total) by mouth 2 (two) times daily. 60 tablet 3   glucose blood (ACCU-CHEK GUIDE) test strip Use as instructed once daily Dx R73.01 100 each 12   ibuprofen (ADVIL) 800 MG tablet Take 800 mg by mouth every 8 (eight) hours as needed for moderate pain.     Ipratropium-Albuterol (COMBIVENT RESPIMAT) 20-100 MCG/ACT AERS respimat INHALE 1 PUFF INTO THE LUNGS EVERY 6 HOURS 4 g 3   ipratropium-albuterol (DUONEB) 0.5-2.5 (3) MG/3ML  SOLN Take 3 mLs by nebulization 4 (four) times daily. 360 mL 11   montelukast (SINGULAIR) 10 MG tablet Take 1 tablet (10 mg total) by mouth at bedtime. 90 tablet 1   ondansetron (ZOFRAN) 4 MG tablet TAKE 1 TABLET BY MOUTH TWICE DAILY AS NEEDED 45 tablet 1   pantoprazole (PROTONIX) 40 MG tablet Take 40 mg by mouth 2 (two) times daily.     pilocarpine (SALAGEN) 5 MG tablet Take 5 mg by mouth 2 (two) times  daily.     rOPINIRole (REQUIP) 0.5 MG tablet TAKE 2 TABLETS BY MOUTH TWICE DAILY FOR RESTLESS LEGS 360 tablet 1   sennosides-docusate sodium (SENOKOT-S) 8.6-50 MG tablet Take 2 tablets by mouth daily as needed for constipation. Home med     SUMAtriptan (IMITREX) 100 MG tablet Take 1 tablet (100 mg total) by mouth every 2 (two) hours as needed (for migraine headaches.). May repeat in 1 hours if headache persists or recurs. 10 tablet 3   tirzepatide (MOUNJARO) 2.5 MG/0.5ML Pen Inject 2.5 mg into the skin once a week. 2 mL 2   traZODone (DESYREL) 150 MG tablet TAKE 1 TABLET(150 MG) BY MOUTH AT BEDTIME 30 tablet 2   budesonide (PULMICORT) 0.5 MG/2ML nebulizer solution Take twice daily in nebulizer 120 mL 12   fluticasone (FLONASE) 50 MCG/ACT nasal spray Place 2 sprays into both nostrils daily as needed for rhinitis or allergies. Home med.     levofloxacin (LEVAQUIN) 500 MG tablet Take 1 tablet (500 mg total) by mouth daily for 7 days. (Patient not taking: Reported on 09/16/2022) 7 tablet 0   predniSONE (STERAPRED UNI-PAK 21 TAB) 10 MG (21) TBPK tablet Use as directed for 6 days (Patient not taking: Reported on 09/16/2022) 21 tablet 0   No facility-administered medications prior to visit.

## 2022-09-23 ENCOUNTER — Ambulatory Visit: Payer: Self-pay | Admitting: Nurse Practitioner

## 2022-09-30 ENCOUNTER — Other Ambulatory Visit
Admission: RE | Admit: 2022-09-30 | Discharge: 2022-09-30 | Disposition: A | Discharge status: Home / Self Care | Payer: 59 | Attending: Nurse Practitioner | Admitting: Nurse Practitioner

## 2022-09-30 DIAGNOSIS — D649 Anemia, unspecified: Secondary | ICD-10-CM | POA: Diagnosis not present

## 2022-09-30 DIAGNOSIS — R7989 Other specified abnormal findings of blood chemistry: Secondary | ICD-10-CM | POA: Diagnosis not present

## 2022-09-30 DIAGNOSIS — M064 Inflammatory polyarthropathy: Secondary | ICD-10-CM | POA: Diagnosis present

## 2022-09-30 LAB — CBC WITH DIFFERENTIAL/PLATELET
Abs Immature Granulocytes: 0.02 10*3/uL (ref 0.00–0.07)
Basophils Absolute: 0 10*3/uL (ref 0.0–0.1)
Basophils Relative: 0 %
Eosinophils Absolute: 0.3 10*3/uL (ref 0.0–0.5)
Eosinophils Relative: 4 %
HCT: 34.5 % — ABNORMAL LOW (ref 36.0–46.0)
Hemoglobin: 11.2 g/dL — ABNORMAL LOW (ref 12.0–15.0)
Immature Granulocytes: 0 %
Lymphocytes Relative: 22 %
Lymphs Abs: 1.6 10*3/uL (ref 0.7–4.0)
MCH: 27.5 pg (ref 26.0–34.0)
MCHC: 32.5 g/dL (ref 30.0–36.0)
MCV: 84.6 fL (ref 80.0–100.0)
Monocytes Absolute: 0.4 10*3/uL (ref 0.1–1.0)
Monocytes Relative: 6 %
Neutro Abs: 4.9 10*3/uL (ref 1.7–7.7)
Neutrophils Relative %: 68 %
Platelets: 272 10*3/uL (ref 150–400)
RBC: 4.08 MIL/uL (ref 3.87–5.11)
RDW: 15.3 % (ref 11.5–15.5)
WBC: 7.3 10*3/uL (ref 4.0–10.5)
nRBC: 0 % (ref 0.0–0.2)

## 2022-09-30 LAB — IRON AND TIBC
Iron: 71 ug/dL (ref 28–170)
Saturation Ratios: 15 % (ref 10.4–31.8)
TIBC: 486 ug/dL — ABNORMAL HIGH (ref 250–450)
UIBC: 415 ug/dL

## 2022-09-30 LAB — T4, FREE: Free T4: 0.89 ng/dL (ref 0.61–1.12)

## 2022-09-30 LAB — CALCIUM: Calcium: 9.4 mg/dL (ref 8.9–10.3)

## 2022-09-30 LAB — FOLATE: Folate: >40 ng/mL (ref >5.9)

## 2022-09-30 LAB — TSH: TSH: 2.079 u[IU]/mL (ref 0.350–4.500)

## 2022-09-30 LAB — C-REACTIVE PROTEIN: CRP: 0.5 mg/dL (ref <1.0)

## 2022-09-30 LAB — VITAMIN B12: Vitamin B-12: 1944 pg/mL — ABNORMAL HIGH (ref 180–914)

## 2022-10-01 LAB — RHEUMATOID FACTOR: Rheumatoid fact SerPl-aCnc: 11.6 IU/mL (ref <14.0)

## 2022-10-02 LAB — ANTINUCLEAR ANTIBODIES, IFA: ANA Ab, IFA: NEGATIVE

## 2022-10-06 ENCOUNTER — Ambulatory Visit (INDEPENDENT_AMBULATORY_CARE_PROVIDER_SITE_OTHER): Payer: Self-pay | Admitting: Nurse Practitioner

## 2022-10-06 VITALS — BP 118/70 | HR 104 | Temp 98.0°F | Resp 16 | Ht 63.0 in | Wt 148.8 lb

## 2022-10-06 DIAGNOSIS — Z0001 Encounter for general adult medical examination with abnormal findings: Secondary | ICD-10-CM

## 2022-10-06 DIAGNOSIS — Z79899 Other long term (current) drug therapy: Secondary | ICD-10-CM

## 2022-10-06 DIAGNOSIS — K1379 Other lesions of oral mucosa: Secondary | ICD-10-CM

## 2022-10-06 DIAGNOSIS — K08409 Partial loss of teeth, unspecified cause, unspecified class: Secondary | ICD-10-CM

## 2022-10-06 DIAGNOSIS — F418 Other specified anxiety disorders: Secondary | ICD-10-CM

## 2022-10-06 DIAGNOSIS — Z1231 Encounter for screening mammogram for malignant neoplasm of breast: Secondary | ICD-10-CM

## 2022-10-06 DIAGNOSIS — R300 Dysuria: Secondary | ICD-10-CM | POA: Diagnosis not present

## 2022-10-06 DIAGNOSIS — M816 Localized osteoporosis [Lequesne]: Secondary | ICD-10-CM

## 2022-10-06 MED ORDER — NYSTATIN 100000 UNIT/ML MT SUSP
5.0000 mL | Freq: Four times a day (QID) | OROMUCOSAL | 0 refills | Status: DC
Start: 2022-10-06 — End: 2023-02-22

## 2022-10-06 MED ORDER — LIDOCAINE VISCOUS HCL 2 % MT SOLN
10.0000 mL | Freq: Four times a day (QID) | OROMUCOSAL | 1 refills | Status: DC | PRN
Start: 2022-10-06 — End: 2023-04-28

## 2022-10-06 MED ORDER — CLONAZEPAM 1 MG PO TABS
0.5000 mg | ORAL_TABLET | Freq: Two times a day (BID) | ORAL | 2 refills | Status: DC | PRN
Start: 2022-10-06 — End: 2022-12-30

## 2022-10-06 MED ORDER — ALENDRONATE SODIUM 70 MG PO TABS
70.0000 mg | ORAL_TABLET | ORAL | 3 refills | Status: DC
Start: 2022-10-06 — End: 2023-08-18

## 2022-10-06 NOTE — Progress Notes (Signed)
[facility] [address] [place], [place] [address]  Internal MEDICINE  Office Visit Note  Patient Name: Summer Murphy  [id number]  [id number]  Date of Service: 10/06/2022  Chief Complaint  Patient presents with   Medicare Wellness   Depression   Gastroesophageal Reflux   Hypertension   Quality Metric Gaps    Mammogram    HPI [name] presents for an annual well visit and physical exam.  Well-appearing 67 y.o. female with hypertension, COPD, allergic rhinitis, asthma, neuropathy, diabetes, IDA, depression, low back pain, RLS, and anxiety.  Routine CRC screening: due in 2028 Routine mammogram: due for mammogram, goes to [facility]  DEXA scan: done in 2022 Eye exam  sees eye doctor regularly foot exam: done today Labs: discussed all of her recent lab results with patient  New or worsening pain: none Other concerns: recently had some teeth pulled, needs refill of lidocaine for mouth pain and some sort of oral suspension for mouth sores.  Reviewed dexa scan -- has osteoporosis of the fore arm-- is not on treatment yet.       10/06/2022    2:17 PM 09/18/2021   11:02 AM 09/16/2020   10:06 AM  MMSE - Mini Mental State Exam  Orientation to time 5 5 5   Orientation to Place 5 5 5   Registration 3 3 3   Attention/ Calculation 5 5 5   Recall 3 3 3   Language- name 2 objects 2 2 2   Language- repeat 1 1 1   Language- follow 3 step command 3 3 0  Language- read & follow direction 1 1 1   Write a sentence 1 1 0  Copy design 1 1 1   Total score 30 30 26     Functional Status Survey: Is the patient deaf or have difficulty hearing?: No Does the patient have difficulty seeing, even when wearing glasses/contacts?: No Does the patient have difficulty concentrating, remembering, or making decisions?: No Does the patient have difficulty walking or climbing stairs?: No Does the patient have difficulty dressing or bathing?: No Does the patient have difficulty doing errands  alone such as visiting a doctor's office or shopping?: No     12/31/2021    1:15 PM 03/08/2022   12:21 PM 04/22/2022    1:51 PM 07/21/2022    1:36 PM 10/06/2022    2:16 PM  Fall Risk  Falls in the past year? 0  0 0 0  Was there an injury with Fall? 0  0    Fall Risk Category Calculator 0  0    Fall Risk Category (Retired) Low      (RETIRED) Patient Fall Risk Level Low fall risk Low fall risk     Patient at Risk for Falls Due to No Fall Risks  No Fall Risks    Fall risk Follow up Falls evaluation completed  Falls evaluation completed         07/21/2022    1:36 PM  Depression screen PHQ 2/9  Decreased Interest 0  Down, Depressed, Hopeless 0  PHQ - 2 Score 0       Current Medication: Outpatient Encounter Medications as of 10/06/2022  Medication Sig   Accu-Chek Softclix Lancets lancets Use as instructed once a daily DX R730.01   alendronate (FOSAMAX) 70 MG tablet Take 1 tablet (70 mg total) by mouth once a week. Take with a full glass of water on an empty stomach.   Blood Glucose Monitoring Suppl (ACCU-CHEK GUIDE ME) w/Device KIT Use as directed  once a day DX R730.01   busPIRone (BUSPAR) 10 MG tablet Take 1 tablet (10 mg total) by mouth 2 (two) times daily.   diclofenac Sodium (VOLTAREN) 1 % GEL Apply 4 g topically 4 (four) times daily.   diltiazem (CARDIZEM CD) 240 MG 24 hr capsule TAKE ONE CAPSULE BY MOUTH DAILY   DULoxetine (CYMBALTA) 60 MG capsule Take 1 capsule (60 mg total) by mouth at bedtime.   EPINEPHrine 0.3 mg/0.3 mL IJ SOAJ injection Inject 0.3 mg into the muscle as needed for anaphylaxis.   fluticasone (FLONASE) 50 MCG/ACT nasal spray Place 1 spray into both nostrils daily.   fluticasone-salmeterol (ADVAIR DISKUS) 500-50 MCG/ACT AEPB Inhale 1 puff into the lungs in the morning and at bedtime.   gabapentin (NEURONTIN) 300 MG capsule Take 1 capsule (300 mg total) by mouth 2 (two) times daily.   gabapentin (NEURONTIN) 600 MG tablet Take 1 tablet (600 mg total) by mouth 2  (two) times daily.   glucose blood (ACCU-CHEK GUIDE) test strip Use as instructed once daily Dx R73.01   ibuprofen (ADVIL) 800 MG tablet Take 800 mg by mouth every 8 (eight) hours as needed for moderate pain.   Ipratropium-Albuterol (COMBIVENT RESPIMAT) 20-100 MCG/ACT AERS respimat INHALE 1 PUFF INTO THE LUNGS EVERY 6 HOURS   ipratropium-albuterol (DUONEB) 0.5-2.5 (3) MG/3ML SOLN Take 3 mLs by nebulization 4 (four) times daily.   montelukast (SINGULAIR) 10 MG tablet Take 1 tablet (10 mg total) by mouth at bedtime.   nystatin (MYCOSTATIN) 100000 UNIT/ML suspension Take 5 mLs (500,000 Units total) by mouth 4 (four) times daily.   ondansetron (ZOFRAN) 4 MG tablet TAKE 1 TABLET BY MOUTH TWICE DAILY AS NEEDED   pantoprazole (PROTONIX) 40 MG tablet Take 40 mg by mouth 2 (two) times daily.   pilocarpine (SALAGEN) 5 MG tablet Take 5 mg by mouth 2 (two) times daily.   predniSONE (STERAPRED UNI-PAK 21 TAB) 10 MG (21) TBPK tablet Use as directed for 6 days   rOPINIRole (REQUIP) 0.5 MG tablet TAKE 2 TABLETS BY MOUTH TWICE DAILY FOR RESTLESS LEGS   sennosides-docusate sodium (SENOKOT-S) 8.6-50 MG tablet Take 2 tablets by mouth daily as needed for constipation. Home med   SUMAtriptan (IMITREX) 100 MG tablet Take 1 tablet (100 mg total) by mouth every 2 (two) hours as needed (for migraine headaches.). May repeat in 1 hours if headache persists or recurs.   tirzepatide [facility]) 2.5 MG/0.5ML Pen Inject 2.5 mg into the skin once a week.   traZODone (DESYREL) 150 MG tablet TAKE 1 TABLET(150 MG) BY MOUTH AT BEDTIME   [DISCONTINUED] clonazePAM (KLONOPIN) 1 MG tablet Take 0.5 tablets (0.5 mg total) by mouth 2 (two) times daily as needed for anxiety.   [DISCONTINUED] lidocaine (XYLOCAINE) 2 % solution SMARTSIG:10 Milliliter(s) By Mouth PRN   clonazePAM (KLONOPIN) 1 MG tablet Take 0.5 tablets (0.5 mg total) by mouth 2 (two) times daily as needed for anxiety.   lidocaine (XYLOCAINE) 2 % solution Use as directed 10 mLs  in the mouth or throat every 6 (six) hours as needed for mouth pain.   No facility-administered encounter medications on file as of 10/06/2022.    Surgical History: Past Surgical History:  Procedure Laterality Date   ABDOMINAL HYSTERECTOMY     AMPUTATION TOE Left 07/31/2016   Procedure: AMPUTATION TOE/MPJ 2nd toe;  Surgeon: Linus Galas, DPM;  Location: [facility] ORS;  Service: Podiatry;  Laterality: Left;   APPENDECTOMY  1990   BACK SURGERY     low back  BREAST SURGERY     bilateral breast reduction   CARDIAC ELECTROPHYSIOLOGY STUDY AND ABLATION     CHOLECYSTECTOMY  1990   COLONOSCOPY WITH PROPOFOL N/A 01/04/2017   Procedure: COLONOSCOPY WITH PROPOFOL;  Surgeon: Scot Jun, MD;  Location: [facility] ENDOSCOPY;  Service: Endoscopy;  Laterality: N/A;   CORNEAL TRANSPLANT     ESOPHAGOGASTRODUODENOSCOPY N/A 04/09/2021   Procedure: ESOPHAGOGASTRODUODENOSCOPY (EGD);  Surgeon: Toney Reil, MD;  Location: [facility] ENDOSCOPY;  Service: Gastroenterology;  Laterality: N/A;   ESOPHAGOGASTRODUODENOSCOPY (EGD) WITH PROPOFOL N/A 01/04/2017   Procedure: ESOPHAGOGASTRODUODENOSCOPY (EGD) WITH PROPOFOL;  Surgeon: Scot Jun, MD;  Location[facility] ENDOSCOPY;  Service: Endoscopy;  Laterality: N/A;   EXCISION BONE CYST Left 07/31/2016   Procedure: EXCISION BONE CYST/exostectomy 28124/left 2nd;  Surgeon: Linus Galas, DPM;  Location: [facility] ORS;  Service: Podiatry;  Laterality: Left;   EXTRACORPOREAL SHOCK WAVE LITHOTRIPSY Left 09/12/2015   Procedure: EXTRACORPOREAL SHOCK WAVE LITHOTRIPSY (ESWL);  Surgeon: Vanna Scotland, MD;  Location: [facility] ORS;  Service: Urology;  Laterality: Left;   FRACTURE SURGERY     left foot   HARDWARE REMOVAL Left 11/14/2020   Procedure: HARDWARE REMOVAL;  Surgeon: Kennedy Bucker, MD;  Location: [facility] ORS;  Service: Orthopedics;  Laterality: Left;   HH repair     Fundoplication   JOINT REPLACEMENT Bilateral 2013,2014   total knees   LAPAROSCOPIC HYSTERECTOMY     LITHOTRIPSY      periprosthetic supracondylar fracture of left femur  02/16/2020   [facility]   TONSILLECTOMY     TOTAL KNEE REVISION Right 11/14/2019   Procedure: Revision patella and tibial polyethylene;  Surgeon: Kennedy Bucker, MD;  Location: [facility] ORS;  Service: Orthopedics;  Laterality: Right;   URETEROSCOPY      Medical History: Past Medical History:  Diagnosis Date   Acid reflux    Anemia    Anxiety    Arrhythmia    treated with meds and has no current problems   Arthritis    most uncomfortable in knees   Asthma    uses several inhalers   Depression    Fever blister    Hematuria    History of kidney stones    Hypertension    Hypoglycemia    Left flank pain    Migraine    Pre-diabetes    Restless leg    Yeast vaginitis     Family History: Family History  Problem Relation Age of Onset   Hypertension Mother    Stroke Mother    Prostate cancer Father    Kidney Stones Father    Diabetes Brother    Hypertension Brother    Breast cancer Maternal Aunt    Kidney disease Neg Hx     Social History   Socioeconomic History   Marital status: Widowed    Spouse name: Not on file   Number of children: Not on file   Years of education: Not on file   Highest education level: Not on file  Occupational History   Not on file  Tobacco Use   Smoking status: Never    Passive exposure: Past   Smokeless tobacco: Never  Vaping Use   Vaping status: Never Used  Substance and Sexual Activity   Alcohol use: No   Drug use: No   Sexual activity: Not on file  Other Topics Concern   Not on file  Social History Narrative   Not on file   Social Determinants of Health   Financial Resource Strain: Not on  file  Food Insecurity: No Food Insecurity (06/02/2022)   Hunger Vital Sign    Worried About Running Out of Food in the Last Year: Never true    Ran Out of Food in the Last Year: Never true  Transportation Needs: No Transportation Needs (06/02/2022)   PRAPARE - Doctor, general practice (Medical): No    Lack of Transportation (Non-Medical): No  Physical Activity: Not on file  Stress: Not on file  Social Connections: Not on file  Intimate Partner Violence: Not At Risk (06/02/2022)   Humiliation, Afraid, Rape, and Kick questionnaire    Fear of Current or Ex-Partner: No    Emotionally Abused: No    Physically Abused: No    Sexually Abused: No      Review of Systems  Constitutional:  Negative for activity change, appetite change, chills, fatigue, fever and unexpected weight change.  HENT:  Positive for dental problem and mouth sores. Negative for congestion, ear pain, rhinorrhea, sore throat and trouble swallowing.   Eyes: Negative.   Respiratory: Negative.  Negative for cough, chest tightness, shortness of breath and wheezing.   Cardiovascular: Negative.  Negative for chest pain and palpitations.  Gastrointestinal: Negative.  Negative for abdominal pain, blood in stool, constipation, diarrhea, nausea and vomiting.  Endocrine: Negative.   Genitourinary: Negative.  Negative for difficulty urinating, dysuria, frequency, hematuria and urgency.  Musculoskeletal: Negative.  Negative for arthralgias, back pain, joint swelling, myalgias and neck pain.  Skin: Negative.  Negative for rash and wound.  Allergic/Immunologic: Negative.  Negative for immunocompromised state.  Neurological: Negative.  Negative for dizziness, seizures, numbness and headaches.  Hematological: Negative.   Psychiatric/Behavioral: Negative.  Negative for behavioral problems, self-injury and suicidal ideas. The patient is not nervous/anxious.     Vital Signs: BP 118/70   Pulse (!) 104   Temp 98 F (36.7 C)   Resp 16   Ht 5\' 3"  (1.6 m)   Wt 148 lb 12.8 oz (67.5 kg)   SpO2 96%   BMI 26.36 kg/m    Physical Exam Vitals reviewed.  Constitutional:      General: She is awake. She is not in acute distress.    Appearance: Normal appearance. She is well-developed, well-groomed and  overweight. She is not ill-appearing or diaphoretic.  HENT:     Head: Normocephalic and atraumatic.     Right Ear: Tympanic membrane, ear canal and external ear normal.     Left Ear: Tympanic membrane, ear canal and external ear normal.     Nose: Nose normal. No congestion or rhinorrhea.     Mouth/Throat:     Lips: Pink.     Mouth: Mucous membranes are moist. Oral lesions present.     Dentition: Abnormal dentition. Dental tenderness and gingival swelling present.     Pharynx: Oropharynx is clear. Uvula midline. No oropharyngeal exudate or posterior oropharyngeal erythema.  Eyes:     General: Lids are normal. Vision grossly intact. Gaze aligned appropriately. No scleral icterus.       Right eye: No discharge.        Left eye: No discharge.     Extraocular Movements: Extraocular movements intact.     Conjunctiva/sclera: Conjunctivae normal.     Pupils: Pupils are equal, round, and reactive to light.     Funduscopic exam:    Right eye: Red reflex present.        Left eye: Red reflex present. Neck:     Thyroid: No thyromegaly.  Vascular: No carotid bruit or JVD.     Trachea: Trachea and phonation normal. No tracheal deviation.  Cardiovascular:     Rate and Rhythm: Normal rate and regular rhythm.     Pulses: Normal pulses.     Heart sounds: Normal heart sounds, S1 normal and S2 normal. No murmur heard.    No friction rub. No gallop.  Pulmonary:     Effort: Pulmonary effort is normal. No accessory muscle usage or respiratory distress.     Breath sounds: Normal breath sounds and air entry. No stridor. No wheezing or rales.  Chest:     Chest wall: No tenderness.  Breasts:    Breasts are symmetrical.     Right: Normal.     Left: Normal.  Abdominal:     General: Bowel sounds are normal. There is no distension.     Palpations: Abdomen is soft. There is no shifting dullness, fluid wave, mass or pulsatile mass.     Tenderness: There is no abdominal tenderness. There is no guarding or  rebound.  Musculoskeletal:        General: No tenderness or deformity. Normal range of motion.     Cervical back: Normal range of motion and neck supple.     Right lower leg: 1+ Edema present.     Left lower leg: 1+ Edema present.  Lymphadenopathy:     Cervical: No cervical adenopathy.     Upper Body:     Right upper body: No supraclavicular, axillary or pectoral adenopathy.     Left upper body: No supraclavicular, axillary or pectoral adenopathy.  Skin:    General: Skin is warm and dry.     Capillary Refill: Capillary refill takes less than 2 seconds.     Coloration: Skin is not pale.     Findings: No erythema or rash.  Neurological:     Mental Status: She is alert and oriented to person, place, and time.     Cranial Nerves: No cranial nerve deficit.     Motor: No abnormal muscle tone.     Coordination: Coordination normal.     Deep Tendon Reflexes: Reflexes are normal and symmetric.  Psychiatric:        Mood and Affect: Mood normal.        Behavior: Behavior normal. Behavior is cooperative.        Thought Content: Thought content normal.        Judgment: Judgment normal.        Assessment/Plan: 1. Encounter for routine adult health examination with abnormal findings Age-appropriate preventive screenings and vaccinations discussed, annual physical exam completed. Routine labs for health maintenance results discussed with the patient today. PHM updated.   2. Localized osteoporosis without current pathological fracture Take alendronate as prescribed.  - alendronate (FOSAMAX) 70 MG tablet; Take 1 tablet (70 mg total) by mouth once a week. Take with a full glass of water on an empty stomach.  Dispense: 12 tablet; Refill: 3  3. Status post tooth extraction Lidocaine and nystatin prescribed.  - lidocaine (XYLOCAINE) 2 % solution; Use as directed 10 mLs in the mouth or throat every 6 (six) hours as needed for mouth pain.  Dispense: 400 mL; Refill: 1 - nystatin (MYCOSTATIN)  100000 UNIT/ML suspension; Take 5 mLs (500,000 Units total) by mouth 4 (four) times daily.  Dispense: 120 mL; Refill: 0  4. Mouth sores Use nystatin as prescribed.  - nystatin (MYCOSTATIN) 100000 UNIT/ML suspension; Take 5 mLs (500,000 Units total) by mouth  4 (four) times daily.  Dispense: 120 mL; Refill: 0  5. Dysuria Routine urinalysis done - UA/M w/rflx Culture, Routine - Microscopic Examination  6. Encounter for medication review Medication list reviewed and updated.  - clonazePAM (KLONOPIN) 1 MG tablet; Take 0.5 tablets (0.5 mg total) by mouth 2 (two) times daily as needed for anxiety.  Dispense: 30 tablet; Refill: 2  7. Encounter for screening mammogram for malignant neoplasm of breast Routine mammogram ordered - MM 3D SCREENING MAMMOGRAM BILATERAL BREAST; Future  8. Situational anxiety Continue clonazepam as prescribed, follow up in 3 months for additional refills.  - clonazePAM (KLONOPIN) 1 MG tablet; Take 0.5 tablets (0.5 mg total) by mouth 2 (two) times daily as needed for anxiety.  Dispense: 30 tablet; Refill: 2      General Counseling: Unknown verbalizes understanding of the findings of todays visit and agrees with plan of treatment. I have discussed any further diagnostic evaluation that may be needed or ordered today. We also reviewed her medications today. she has been encouraged to call the office with any questions or concerns that should arise related to todays visit.    Orders Placed This Encounter  Procedures   MM 3D SCREENING MAMMOGRAM BILATERAL BREAST   UA/M w/rflx Culture, Routine    Meds ordered this encounter  Medications   alendronate (FOSAMAX) 70 MG tablet    Sig: Take 1 tablet (70 mg total) by mouth once a week. Take with a full glass of water on an empty stomach.    Dispense:  12 tablet    Refill:  3   clonazePAM (KLONOPIN) 1 MG tablet    Sig: Take 0.5 tablets (0.5 mg total) by mouth 2 (two) times daily as needed for anxiety.    Dispense:  30  tablet    Refill:  2   lidocaine (XYLOCAINE) 2 % solution    Sig: Use as directed 10 mLs in the mouth or throat every 6 (six) hours as needed for mouth pain.    Dispense:  400 mL    Refill:  1   nystatin (MYCOSTATIN) 100000 UNIT/ML suspension    Sig: Take 5 mLs (500,000 Units total) by mouth 4 (four) times daily.    Dispense:  120 mL    Refill:  0    Fill script now    Return in about 3 months (around 12/30/2022) for F/U, anxiety med refill, Marshon Bangs PCP.   Total time spent:30 Minutes Time spent includes review of chart, medications, test results, and follow up plan with the patient.   [place] Controlled Substance Database was reviewed by me.  This patient was seen by [name], FNP-C in collaboration with Dr. Beverely Risen as a part of collaborative care agreement.  Cristoval Teall R. Tedd Sias, MSN, FNP-C Internal medicine

## 2022-10-07 ENCOUNTER — Encounter: Payer: Self-pay | Admitting: Nurse Practitioner

## 2022-10-07 LAB — UA/M W/RFLX CULTURE, ROUTINE
Bilirubin, UA: NEGATIVE
Glucose, UA: NEGATIVE
Leukocytes,UA: NEGATIVE
Nitrite, UA: NEGATIVE
Protein,UA: NEGATIVE
RBC, UA: NEGATIVE
Specific Gravity, UA: 1.023 (ref 1.005–1.030)
Urobilinogen, Ur: 0.2 mg/dL (ref 0.2–1.0)
pH, UA: 5 (ref 5.0–7.5)

## 2022-10-07 LAB — MICROSCOPIC EXAMINATION
Bacteria, UA: NONE SEEN
Casts: NONE SEEN /lpf
WBC, UA: NONE SEEN /hpf (ref 0–5)

## 2022-10-14 ENCOUNTER — Ambulatory Visit: Payer: Self-pay | Admitting: Internal Medicine

## 2022-10-23 ENCOUNTER — Other Ambulatory Visit: Payer: Self-pay | Admitting: Nurse Practitioner

## 2022-10-23 DIAGNOSIS — Z760 Encounter for issue of repeat prescription: Secondary | ICD-10-CM

## 2022-10-28 ENCOUNTER — Encounter: Payer: Self-pay | Admitting: Nurse Practitioner

## 2022-10-28 ENCOUNTER — Ambulatory Visit (INDEPENDENT_AMBULATORY_CARE_PROVIDER_SITE_OTHER): Payer: Self-pay | Admitting: Nurse Practitioner

## 2022-10-28 VITALS — BP 120/72 | HR 105 | Temp 98.3°F | Resp 16 | Ht 63.0 in | Wt 146.4 lb

## 2022-10-28 DIAGNOSIS — E119 Type 2 diabetes mellitus without complications: Secondary | ICD-10-CM

## 2022-10-28 DIAGNOSIS — M5412 Radiculopathy, cervical region: Secondary | ICD-10-CM

## 2022-10-28 DIAGNOSIS — R739 Hyperglycemia, unspecified: Secondary | ICD-10-CM | POA: Diagnosis not present

## 2022-10-28 MED ORDER — METHOCARBAMOL 750 MG PO TABS
750.0000 mg | ORAL_TABLET | Freq: Four times a day (QID) | ORAL | 1 refills | Status: DC | PRN
Start: 2022-10-28 — End: 2023-02-03

## 2022-10-28 MED ORDER — TIRZEPATIDE 2.5 MG/0.5ML ~~LOC~~ SOAJ
2.5000 mg | SUBCUTANEOUS | 2 refills | Status: DC
Start: 2022-10-28 — End: 2022-11-03

## 2022-10-28 MED ORDER — IBUPROFEN 800 MG PO TABS
800.0000 mg | ORAL_TABLET | Freq: Three times a day (TID) | ORAL | 1 refills | Status: AC | PRN
Start: 2022-10-28 — End: ?

## 2022-10-28 NOTE — Progress Notes (Signed)
[facility] [address] [place], [place] [address]  Internal MEDICINE  Office Visit Note  Patient Name: Summer Murphy  [id number]  [id number]  Date of Service: 10/28/2022  Chief Complaint  Patient presents with   Acute Visit    Neck pain.      HPI Nicholette presents for an acute sick visit for neck pain --onset: 2-3 days ago  --location: back of neck left side radiating to left shoulder.  Increased pain in shoulder with raising left arm Decreased ROM in left shoulder. Increased pain when turning head side to side right>left.  Hurts more during the day and less at night.  Already taking medication that treats nerve pain.     Current Medication:  Outpatient Encounter Medications as of 10/28/2022  Medication Sig   Accu-Chek Softclix Lancets lancets Use as instructed once a daily DX R730.01   alendronate (FOSAMAX) 70 MG tablet Take 1 tablet (70 mg total) by mouth once a week. Take with a full glass of water on an empty stomach.   Blood Glucose Monitoring Suppl (ACCU-CHEK GUIDE ME) w/Device KIT Use as directed once a day DX R730.01   busPIRone (BUSPAR) 10 MG tablet Take 1 tablet (10 mg total) by mouth 2 (two) times daily.   clonazePAM (KLONOPIN) 1 MG tablet Take 0.5 tablets (0.5 mg total) by mouth 2 (two) times daily as needed for anxiety.   diclofenac Sodium (VOLTAREN) 1 % GEL Apply 4 g topically 4 (four) times daily.   diltiazem (CARDIZEM CD) 240 MG 24 hr capsule TAKE ONE CAPSULE BY MOUTH DAILY   DULoxetine (CYMBALTA) 60 MG capsule Take 1 capsule (60 mg total) by mouth at bedtime.   EPINEPHrine 0.3 mg/0.3 mL IJ SOAJ injection Inject 0.3 mg into the muscle as needed for anaphylaxis.   fluticasone (FLONASE) 50 MCG/ACT nasal spray Place 1 spray into both nostrils daily.   fluticasone-salmeterol (ADVAIR DISKUS) 500-50 MCG/ACT AEPB Inhale 1 puff into the lungs in the morning and at bedtime.   gabapentin (NEURONTIN) 300 MG capsule Take 1 capsule (300 mg total) by mouth 2  (two) times daily.   gabapentin (NEURONTIN) 600 MG tablet Take 1 tablet (600 mg total) by mouth 2 (two) times daily.   glucose blood (ACCU-CHEK GUIDE) test strip Use as instructed once daily Dx R73.01   Ipratropium-Albuterol (COMBIVENT RESPIMAT) 20-100 MCG/ACT AERS respimat INHALE 1 PUFF BY MOUTH EVERY 6 HOURS   ipratropium-albuterol (DUONEB) 0.5-2.5 (3) MG/3ML SOLN Take 3 mLs by nebulization 4 (four) times daily.   lidocaine (XYLOCAINE) 2 % solution Use as directed 10 mLs in the mouth or throat every 6 (six) hours as needed for mouth pain.   methocarbamol (ROBAXIN) 750 MG tablet Take 1 tablet (750 mg total) by mouth every 6 (six) hours as needed for muscle spasms.   montelukast (SINGULAIR) 10 MG tablet Take 1 tablet (10 mg total) by mouth at bedtime.   nystatin (MYCOSTATIN) 100000 UNIT/ML suspension Take 5 mLs (500,000 Units total) by mouth 4 (four) times daily.   ondansetron (ZOFRAN) 4 MG tablet TAKE 1 TABLET BY MOUTH TWICE DAILY AS NEEDED   pantoprazole (PROTONIX) 40 MG tablet Take 40 mg by mouth 2 (two) times daily.   pilocarpine (SALAGEN) 5 MG tablet Take 5 mg by mouth 2 (two) times daily.   rOPINIRole (REQUIP) 0.5 MG tablet TAKE 2 TABLETS BY MOUTH TWICE DAILY FOR RESTLESS LEGS   sennosides-docusate sodium (SENOKOT-S) 8.6-50 MG tablet Take 2 tablets by mouth daily as needed for constipation.  Home med   SUMAtriptan (IMITREX) 100 MG tablet Take 1 tablet (100 mg total) by mouth every 2 (two) hours as needed (for migraine headaches.). May repeat in 1 hours if headache persists or recurs.   traZODone (DESYREL) 150 MG tablet TAKE 1 TABLET(150 MG) BY MOUTH AT BEDTIME   [DISCONTINUED] ibuprofen (ADVIL) 800 MG tablet Take 800 mg by mouth every 8 (eight) hours as needed for moderate pain.   [DISCONTINUED] predniSONE (STERAPRED UNI-PAK 21 TAB) 10 MG (21) TBPK tablet Use as directed for 6 days   [DISCONTINUED] tirzepatide (MOUNJARO) 2.5 MG/0.5ML Pen Inject 2.5 mg into the skin once a week.   ibuprofen  (ADVIL) 800 MG tablet Take 1 tablet (800 mg total) by mouth every 8 (eight) hours as needed for moderate pain.   tirzepatide (MOUNJARO) 2.5 MG/0.5ML Pen Inject 2.5 mg into the skin once a week.   No facility-administered encounter medications on file as of 10/28/2022.      Medical History: Past Medical History:  Diagnosis Date   Acid reflux    Anemia    Anxiety    Arrhythmia    treated with meds and has no current problems   Arthritis    most uncomfortable in knees   Asthma    uses several inhalers   Depression    Fever blister    Hematuria    History of kidney stones    Hypertension    Hypoglycemia    Left flank pain    Migraine    Pre-diabetes    Restless leg    Yeast vaginitis      Vital Signs: BP 120/72   Pulse (!) 105   Temp 98.3 F (36.8 C)   Resp 16   Ht 5\' 3"  (1.6 m)   Wt 146 lb 6.4 oz (66.4 kg)   SpO2 94%   BMI 25.93 kg/m    Review of Systems  Constitutional:  Negative for fatigue and fever.       Neck pain, left shoulder pain  HENT: Negative.  Negative for congestion, mouth sores and postnasal drip.   Respiratory: Negative.  Negative for cough, chest tightness, shortness of breath and wheezing.   Cardiovascular: Negative.  Negative for chest pain and palpitations.  Genitourinary:  Negative for flank pain.  Musculoskeletal:  Positive for arthralgias (left shoulder pain).  Psychiatric/Behavioral: Negative.      Physical Exam Vitals reviewed.  Constitutional:      Appearance: Normal appearance.  HENT:     Head: Normocephalic and atraumatic.  Eyes:     Pupils: Pupils are equal, round, and reactive to light.  Neck:     Comments: Decreased range of motion in neck and increased pain Musculoskeletal:     Right shoulder: Normal.     Left shoulder: Decreased range of motion. Decreased strength.     Cervical back: Tenderness present.  Neurological:     Mental Status: She is alert and oriented to person, place, and time.  Psychiatric:        Mood  and Affect: Mood normal.        Behavior: Behavior normal.       Assessment/Plan: 1. Cervical radiculopathy Prn ibuprofen and muscle relaxant prescribed.  - methocarbamol (ROBAXIN) 750 MG tablet; Take 1 tablet (750 mg total) by mouth every 6 (six) hours as needed for muscle spasms.  Dispense: 60 tablet; Refill: 1 - ibuprofen (ADVIL) 800 MG tablet; Take 1 tablet (800 mg total) by mouth every 8 (eight) hours as needed  for moderate pain.  Dispense: 60 tablet; Refill: 1  2. Diabetes mellitus without complication (HCC) Continue mounjaro as prescribed.  - tirzepatide [facility]) 2.5 MG/0.5ML Pen; Inject 2.5 mg into the skin once a week.  Dispense: 6 mL; Refill: 2  3. Steroid-induced hyperglycemia Continue mounjaro as prescribed.  - tirzepatide [facility]) 2.5 MG/0.5ML Pen; Inject 2.5 mg into the skin once a week.  Dispense: 6 mL; Refill: 2   General Counseling: Eowyn verbalizes understanding of the findings of todays visit and agrees with plan of treatment. I have discussed any further diagnostic evaluation that may be needed or ordered today. We also reviewed her medications today. she has been encouraged to call the office with any questions or concerns that should arise related to todays visit.    Counseling:    No orders of the defined types were placed in this encounter.   Meds ordered this encounter  Medications   methocarbamol (ROBAXIN) 750 MG tablet    Sig: Take 1 tablet (750 mg total) by mouth every 6 (six) hours as needed for muscle spasms.    Dispense:  60 tablet    Refill:  1   ibuprofen (ADVIL) 800 MG tablet    Sig: Take 1 tablet (800 mg total) by mouth every 8 (eight) hours as needed for moderate pain.    Dispense:  60 tablet    Refill:  1    Discussed med with patient, tolerated ibuprofen fine. Is not allergic.   tirzepatide (MOUNJARO) 2.5 MG/0.5ML Pen    Sig: Inject 2.5 mg into the skin once a week.    Dispense:  6 mL    Refill:  2    Dx code E11.65     Return if symptoms worsen or fail to improve, for previously scheduled visit in october.  [place] Controlled Substance Database was reviewed by me for overdose risk score (ORS)  Time spent:30 Minutes Time spent with patient included reviewing progress notes, labs, imaging studies, and discussing plan for follow up.   This patient was seen by [name], FNP-C in collaboration with Dr. Beverely Risen as a part of collaborative care agreement.   R. Tedd Sias, MSN, FNP-C Internal Medicine

## 2022-11-03 ENCOUNTER — Other Ambulatory Visit: Payer: Self-pay | Admitting: Nurse Practitioner

## 2022-11-03 DIAGNOSIS — Z760 Encounter for issue of repeat prescription: Secondary | ICD-10-CM

## 2022-11-03 DIAGNOSIS — E119 Type 2 diabetes mellitus without complications: Secondary | ICD-10-CM

## 2022-11-03 DIAGNOSIS — T380X5A Adverse effect of glucocorticoids and synthetic analogues, initial encounter: Secondary | ICD-10-CM

## 2022-11-03 DIAGNOSIS — G63 Polyneuropathy in diseases classified elsewhere: Secondary | ICD-10-CM

## 2022-11-03 DIAGNOSIS — Z79899 Other long term (current) drug therapy: Secondary | ICD-10-CM

## 2022-11-03 MED ORDER — TIRZEPATIDE 2.5 MG/0.5ML ~~LOC~~ SOAJ
2.5000 mg | SUBCUTANEOUS | 2 refills | Status: DC
Start: 2022-11-03 — End: 2023-05-24

## 2022-11-18 ENCOUNTER — Other Ambulatory Visit: Payer: Self-pay | Admitting: Orthopedic Surgery

## 2022-11-18 ENCOUNTER — Ambulatory Visit
Admission: RE | Admit: 2022-11-18 | Discharge: 2022-11-18 | Disposition: A | Discharge status: Home / Self Care | Payer: 59 | Source: Ambulatory Visit | Attending: Orthopedic Surgery | Admitting: Orthopedic Surgery

## 2022-11-18 DIAGNOSIS — M4802 Spinal stenosis, cervical region: Secondary | ICD-10-CM

## 2022-11-19 ENCOUNTER — Encounter: Payer: Self-pay | Admitting: Oncology

## 2022-11-19 ENCOUNTER — Other Ambulatory Visit: Payer: Self-pay | Admitting: Nurse Practitioner

## 2022-11-19 DIAGNOSIS — M064 Inflammatory polyarthropathy: Secondary | ICD-10-CM

## 2022-11-19 DIAGNOSIS — E119 Type 2 diabetes mellitus without complications: Secondary | ICD-10-CM

## 2022-11-24 NOTE — Progress Notes (Signed)
Referring Physician:  Kennedy Bucker, MD [address] [facility] [place],  [place] [address]  Primary Physician:  [name], NP  History of Present Illness: 11/25/2022 Summer Murphy is here today with a chief complaint of neck pain radiating mostly into the left shoulder as well as numbness and tingling into the hands and fingers.  Is been going on for at least 1 to 2 months.  He often feels like it is in the base of her neck that she gets pain and radiating symptoms down into her arms.  She states that this can be stabbing or numb pain can be 10 out of 10.  He gets worse when she raises her arm or moves her head.  She has not found significant palliating movements.  She is not having any bowel or bladder symptoms.  She has not yet had any physical therapy.  She has been on anti-inflammatories.  She has not yet had any epidural spinal injections.  She has had a previous lumbar surgery but no previous cervical surgeries.  Conservative measures:  Physical therapy:  has not participated in PT,  Multimodal medical therapy including regular antiinflammatories:  robaxin, ibuprofen, gabapentin  Injections:  has not received epidural steroid injections   I have utilized the care everywhere function in epic to review the outside records available from external health systems.  Review of Systems:  A 10 point review of systems is negative, except for the pertinent positives and negatives detailed in the HPI.  Past Medical History: Past Medical History:  Diagnosis Date   Acid reflux    Anemia    Anxiety    Arrhythmia    treated with meds and has no current problems   Arthritis    most uncomfortable in knees   Asthma    uses several inhalers   Depression    Fever blister    Hematuria    History of kidney stones    Hypertension    Hypoglycemia    Left flank pain    Migraine    Pre-diabetes    Restless leg    Yeast vaginitis     Past Surgical  History: Past Surgical History:  Procedure Laterality Date   ABDOMINAL HYSTERECTOMY     AMPUTATION TOE Left 07/31/2016   Procedure: AMPUTATION TOE/MPJ 2nd toe;  Surgeon: Linus Galas, DPM;  Location: [facility] ORS;  Service: Podiatry;  Laterality: Left;   APPENDECTOMY  1990   BACK SURGERY     low back   BREAST SURGERY     bilateral breast reduction   CARDIAC ELECTROPHYSIOLOGY STUDY AND ABLATION     CHOLECYSTECTOMY  1990   COLONOSCOPY WITH PROPOFOL N/A 01/04/2017   Procedure: COLONOSCOPY WITH PROPOFOL;  Surgeon: Scot Jun, MD;  Location: [facility] ENDOSCOPY;  Service: Endoscopy;  Laterality: N/A;   CORNEAL TRANSPLANT     ESOPHAGOGASTRODUODENOSCOPY N/A 04/09/2021   Procedure: ESOPHAGOGASTRODUODENOSCOPY (EGD);  Surgeon: Toney Reil, MD;  Location: [facility] ENDOSCOPY;  Service: Gastroenterology;  Laterality: N/A;   ESOPHAGOGASTRODUODENOSCOPY (EGD) WITH PROPOFOL N/A 01/04/2017   Procedure: ESOPHAGOGASTRODUODENOSCOPY (EGD) WITH PROPOFOL;  Surgeon: Scot Jun, MD;  Location: [facility] ENDOSCOPY;  Service: Endoscopy;  Laterality: N/A;   EXCISION BONE CYST Left 07/31/2016   Procedure: EXCISION BONE CYST/exostectomy 28124/left 2nd;  Surgeon: Linus Galas, DPM;  Location: [facility] ORS;  Service: Podiatry;  Laterality: Left;   EXTRACORPOREAL SHOCK WAVE LITHOTRIPSY Left 09/12/2015   Procedure: EXTRACORPOREAL SHOCK WAVE LITHOTRIPSY (ESWL);  Surgeon: Vanna Scotland,  MD;  Location: [facility] ORS;  Service: Urology;  Laterality: Left;   FRACTURE SURGERY     left foot   HARDWARE REMOVAL Left 11/14/2020   Procedure: HARDWARE REMOVAL;  Surgeon: Kennedy Bucker, MD;  Location: [facility] ORS;  Service: Orthopedics;  Laterality: Left;   HH repair     Fundoplication   JOINT REPLACEMENT Bilateral 2013,2014   total knees   LAPAROSCOPIC HYSTERECTOMY     LITHOTRIPSY     periprosthetic supracondylar fracture of left femur  02/16/2020   [facility]   TONSILLECTOMY     TOTAL KNEE REVISION Right 11/14/2019   Procedure:  Revision patella and tibial polyethylene;  Surgeon: Kennedy Bucker, MD;  Location: [facility] ORS;  Service: Orthopedics;  Laterality: Right;   URETEROSCOPY      Allergies: Allergies as of 11/25/2022 - Review Complete 11/25/2022  Allergen Reaction Noted   Librium [chlordiazepoxide] Shortness Of Breath 08/28/2014   Metoclopramide Hives and Other (See Comments) 08/28/2014   Vanilla Shortness Of Breath 09/10/2014   Aspirin Hives 08/28/2014   Nsaids Rash 11/21/2014   Azithromycin Other (See Comments) 11/21/2014   Buprenorphine hcl Other (See Comments) 09/09/2015   Chlordiazepoxide hcl Other (See Comments) 11/21/2014   Morphine Other (See Comments) 08/28/2014   Sulfa antibiotics  11/21/2014   Tolmetin  09/09/2015   Amlodipine besylate Itching and Rash 05/10/2018   Iron Nausea And Vomiting 11/21/2014    Medications:  Current Outpatient Medications:    Accu-Chek Softclix Lancets lancets, Use as instructed once a daily DX R730.01, Disp: 100 each, Rfl: 12   alendronate (FOSAMAX) 70 MG tablet, Take 1 tablet (70 mg total) by mouth once a week. Take with a full glass of water on an empty stomach., Disp: 12 tablet, Rfl: 3   Blood Glucose Monitoring Suppl (ACCU-CHEK GUIDE ME) w/Device KIT, Use as directed once a day DX R730.01, Disp: 1 kit, Rfl: 0   busPIRone (BUSPAR) 10 MG tablet, Take 1 tablet (10 mg total) by mouth 2 (two) times daily., Disp: 60 tablet, Rfl: 2   clonazePAM (KLONOPIN) 1 MG tablet, Take 0.5 tablets (0.5 mg total) by mouth 2 (two) times daily as needed for anxiety., Disp: 30 tablet, Rfl: 2   diclofenac Sodium (VOLTAREN) 1 % GEL, Apply 4 g topically 4 (four) times daily., Disp: 350 g, Rfl: 1   diltiazem (CARDIZEM CD) 240 MG 24 hr capsule, TAKE ONE CAPSULE BY MOUTH DAILY, Disp: 90 capsule, Rfl: 3   DULoxetine (CYMBALTA) 60 MG capsule, Take 1 capsule (60 mg total) by mouth at bedtime., Disp: 90 capsule, Rfl: 1   EPINEPHrine 0.3 mg/0.3 mL IJ SOAJ injection, Inject 0.3 mg into the muscle as  needed for anaphylaxis., Disp: 1 each, Rfl: 3   fluticasone (FLONASE) 50 MCG/ACT nasal spray, Place 1 spray into both nostrils daily., Disp: 18.2 mL, Rfl: 2   fluticasone-salmeterol (ADVAIR DISKUS) 500-50 MCG/ACT AEPB, Inhale 1 puff into the lungs in the morning and at bedtime., Disp: 60 each, Rfl: 12   gabapentin (NEURONTIN) 300 MG capsule, TAKE 1 CAPSULE(300 MG) BY MOUTH TWICE DAILY, Disp: 60 capsule, Rfl: 2   gabapentin (NEURONTIN) 600 MG tablet, TAKE 1 TABLET(600 MG) BY MOUTH TWICE DAILY, Disp: 60 tablet, Rfl: 3   glucose blood (ACCU-CHEK GUIDE) test strip, Use as instructed once daily Dx R73.01, Disp: 100 each, Rfl: 12   ibuprofen (ADVIL) 800 MG tablet, Take 1 tablet (800 mg total) by mouth every 8 (eight) hours as needed for moderate pain., Disp: 60 tablet, Rfl: 1  Ipratropium-Albuterol (COMBIVENT RESPIMAT) 20-100 MCG/ACT AERS respimat, INHALE 1 PUFF BY MOUTH EVERY 6 HOURS, Disp: 4 g, Rfl: 3   ipratropium-albuterol (DUONEB) 0.5-2.5 (3) MG/3ML SOLN, Take 3 mLs by nebulization 4 (four) times daily., Disp: 360 mL, Rfl: 11   lidocaine (XYLOCAINE) 2 % solution, Use as directed 10 mLs in the mouth or throat every 6 (six) hours as needed for mouth pain., Disp: 400 mL, Rfl: 1   methocarbamol (ROBAXIN) 750 MG tablet, Take 1 tablet (750 mg total) by mouth every 6 (six) hours as needed for muscle spasms., Disp: 60 tablet, Rfl: 1   montelukast (SINGULAIR) 10 MG tablet, Take 1 tablet (10 mg total) by mouth at bedtime., Disp: 90 tablet, Rfl: 1   nystatin (MYCOSTATIN) 100000 UNIT/ML suspension, Take 5 mLs (500,000 Units total) by mouth 4 (four) times daily., Disp: 120 mL, Rfl: 0   ondansetron (ZOFRAN) 4 MG tablet, TAKE 1 TABLET BY MOUTH TWICE DAILY AS NEEDED, Disp: 45 tablet, Rfl: 1   oxyCODONE (ROXICODONE) 5 MG immediate release tablet, Take 1 tablet (5 mg total) by mouth every 6 (six) hours as needed., Disp: 15 tablet, Rfl: 0   pantoprazole (PROTONIX) 40 MG tablet, Take 40 mg by mouth 2 (two) times daily.,  Disp: , Rfl:    pilocarpine (SALAGEN) 5 MG tablet, Take 5 mg by mouth 2 (two) times daily., Disp: , Rfl:    predniSONE (DELTASONE) 10 MG tablet, Take 1 tablet (10 mg total) by mouth daily. 6,5,4,3,2,1 six day taper, Disp: 21 tablet, Rfl: 0   rOPINIRole (REQUIP) 0.5 MG tablet, TAKE 2 TABLETS BY MOUTH TWICE DAILY FOR RESTLESS LEGS, Disp: 360 tablet, Rfl: 1   sennosides-docusate sodium (SENOKOT-S) 8.6-50 MG tablet, Take 2 tablets by mouth daily as needed for constipation. Home med, Disp: , Rfl:    SUMAtriptan (IMITREX) 100 MG tablet, Take 1 tablet (100 mg total) by mouth every 2 (two) hours as needed (for migraine headaches.). May repeat in 1 hours if headache persists or recurs., Disp: 10 tablet, Rfl: 3   tirzepatide (MOUNJARO) 2.5 MG/0.5ML Pen, Inject 2.5 mg into the skin once a week., Disp: 6 mL, Rfl: 2   traZODone (DESYREL) 150 MG tablet, TAKE 1 TABLET(150 MG) BY MOUTH AT BEDTIME, Disp: 30 tablet, Rfl: 2  Social History: Social History   Tobacco Use   Smoking status: Never    Passive exposure: Past   Smokeless tobacco: Never  Vaping Use   Vaping status: Never Used  Substance Use Topics   Alcohol use: No   Drug use: No    Family Medical History: Family History  Problem Relation Age of Onset   Hypertension Mother    Stroke Mother    Prostate cancer Father    Kidney Stones Father    Diabetes Brother    Hypertension Brother    Breast cancer Maternal Aunt    Kidney disease Neg Hx     Physical Examination: Vitals:   11/25/22 1425  BP: 130/72    General: Patient is in no apparent distress. Attention to examination is appropriate.  Neck:   Supple.  Full range of motion.  Respiratory: Patient is breathing without any difficulty.   NEUROLOGICAL:     Awake, alert, oriented to person, place, and time.  Speech is clear and fluent.   Cranial Nerves: Pupils equal round and reactive to light.  Facial tone is symmetric.  Facial sensation is symmetric. Shoulder shrug is symmetric.  Tongue protrusion is midline.    Strength: Giveaway weakness  noted throughout the bilateral upper extremities but appears to be intact without major deficit or wasting.  Reflexes are 1+ and symmetric at the biceps, triceps, brachioradialis, patella and achilles.   Hoffman's is absent. Clonus is absent     No evidence of dysmetria noted.  Gait is normal.    Imaging: Narrative & Impression  CLINICAL DATA:  Posterior neck pain running down the left arm with   EXAM: MRI CERVICAL SPINE WITHOUT CONTRAST   TECHNIQUE: Multiplanar, multisequence MR imaging of the cervical spine was performed. No intravenous contrast was administered.   COMPARISON:  Numbness.  Cervical spine MRI 10/29/2009   FINDINGS: Image quality is significantly degraded by motion artifact.   Alignment: Normal.   Vertebrae: Vertebral body heights are preserved. Background marrow signal is normal. There is no suspicious marrow signal abnormality or marrow edema.   Cord: There is no definite cord signal abnormality, though evaluation is significantly degraded by motion artifact.   Posterior Fossa, vertebral arteries, paraspinal tissues: The imaged posterior fossa is unremarkable. The vertebral artery flow voids are normal. The paraspinal soft tissues are unremarkable.   Disc levels:   C2-C3: No significant spinal canal or neural foraminal stenosis   C3-C4: There is a posterior disc osteophyte complex with right worse than left uncovertebral ridging and bilateral facet arthropathy with ligamentum flavum thickening resulting in at least moderate spinal canal stenosis with likely mass effect on the cord, and at least moderate right and mild left neural foraminal stenosis.   C4-C5: There is a shallow posterior disc osteophyte complex with probable mild-to-moderate spinal canal stenosis. There is uncovertebral and facet arthropathy, but the neural foramina are not well assessed due to motion artifact.    C5-C6: There is uncovertebral and facet arthropathy with probable moderate spinal canal stenosis, and possibly severe bilateral neural foraminal stenosis.   C6-C7: There is suspected disc bulge contributing to mild spinal canal stenosis and possibly severe bilateral neural foraminal stenosis   C7-T1: No significant spinal canal or neural foraminal stenosis.   IMPRESSION: 1. Image quality is significantly degraded by motion artifact, limiting evaluation of the spinal canal and neural foramina. 2. Within this confine, there is probably at least moderate spinal canal stenosis at C3-C4 and C5-C6, mild-to-moderate spinal canal stenosis at C4-C5, and mild spinal canal stenosis at C6-C7. Cord signal is not well assessed due to artifact. CT myelogram may be considered as indicated given less susceptbility to motion artifact. 3. At least moderate right and mild left neural foraminal stenosis at C3-C4, and possibly severe bilateral neural foraminal stenosis at C5-C6 and C6-C7.     Electronically Signed   By: Lesia Hausen M.D.   On: 11/18/2022 16:36   I have personally reviewed the images and agree with the above interpretation.  Medical Decision Making/Assessment and Plan: Ms. Zoltowski is a pleasant 67 y.o. female with significant cervicalgia and some radiating pain onto the left upper extremity.  Likely secondary to cervical radiculopathy and cervical spondylosis.  She can consider left-sided cervical injections as well as physical therapy.  She does have an active tooth abscess currently so at this point given her full strength we would not likely pursue any surgical intervention as she would be high risk.  Her DEXA of the spine was not within normal limits.  Should she continue to have pain and cervical radiculopathy will likely need to obtain a sedated MRI.  Would like to continue to follow along with her, given her current active oral infection we feel  like it would be prudent for her to  take care of that issue first.  Thank you for involving me in the care of this patient.    Lovenia Kim MD/MSCR Neurosurgery

## 2022-11-25 ENCOUNTER — Other Ambulatory Visit: Payer: Self-pay

## 2022-11-25 ENCOUNTER — Encounter: Payer: Self-pay | Admitting: Pharmacist

## 2022-11-25 ENCOUNTER — Ambulatory Visit
Admission: RE | Admit: 2022-11-25 | Discharge: 2022-11-25 | Disposition: A | Discharge status: Home / Self Care | Payer: Self-pay | Source: Ambulatory Visit | Attending: Neurosurgery | Admitting: Neurosurgery

## 2022-11-25 ENCOUNTER — Ambulatory Visit (INDEPENDENT_AMBULATORY_CARE_PROVIDER_SITE_OTHER): Payer: 59 | Admitting: Neurosurgery

## 2022-11-25 ENCOUNTER — Encounter: Payer: Self-pay | Admitting: Neurosurgery

## 2022-11-25 VITALS — BP 130/72 | Ht 60.0 in | Wt 143.8 lb

## 2022-11-25 DIAGNOSIS — G4486 Cervicogenic headache: Secondary | ICD-10-CM

## 2022-11-25 DIAGNOSIS — Z049 Encounter for examination and observation for unspecified reason: Secondary | ICD-10-CM

## 2022-11-25 DIAGNOSIS — M5010 Cervical disc disorder with radiculopathy, unspecified cervical region: Secondary | ICD-10-CM | POA: Diagnosis not present

## 2022-11-25 DIAGNOSIS — M4802 Spinal stenosis, cervical region: Secondary | ICD-10-CM | POA: Diagnosis not present

## 2022-11-25 DIAGNOSIS — M4722 Other spondylosis with radiculopathy, cervical region: Secondary | ICD-10-CM

## 2022-11-25 NOTE — Patient Instructions (Signed)
 LOCAL PHYSICAL THERAPY  [facility]  [address]  [place], [place] [address]  [phone]  [facility]  [address] [place], [place] [address]  [phone]  [facility] (2 locations)  [address]  [place], [place] [address]  [phone]          or  [address]  [place], [place] [address]  [phone]  [facility]  [address]  [address]  [place], [place] [address]  [phone]  **dry needling**  [facility])  [address]  [place], [place] [address]  [phone]  Fax: [phone]  ** Aquatic therapy[address] [place], [place] [address] [phone] **Aquatic therapy**  [facility]  [facility]  [address]  [place], [place] [address]  [phone]  [facility]  [address]  [place], [place] [address]  [phone]  [facility]  [address]   Janora Norlander  [place], [place] [address]  [phone]  [facility]  [address]  [place], [place] [address]  [phone]  **dry needling**   PELVIC FLOOR/SI JOINT  [facility]  Mariane Masters, PT  [email or web address]   [place]  [facility]  [address]  [address] [place], [place] [address]  [phone]   [facility] [place]  [address], [place] [address]  [phone]   [facility], [place]  [facility]  Raymond Gurney, PT  [address]  [place], [place] [address] [phone]   Samara Deist  [facility]  [address]  [phone]   [facility]  [address]  [phone]   [facility]  Annamaria Boots [facility]  [address]  [place], [place] [address]  [phone]   **No Aetna or medicaid**  [facility]  [phone] [address] [place] [address]  [phone]  [facility]  [address]  [place], [place] [address]  [phone]  Dannial Monarch, DC  [address], [place] [address]  [phone]  [facility]  [address], [place] [address]  [phone]  [facility]  [address]  [place], [place] [address]  [phone]  [facility]  (3 locations)  [address]  [place], [place] [address]  [phone]  **dry needling**           or  [address] [place], [place] [address]  [phone]  **Additionally has Gloris Manchester, OT**           or  [address]  [place], [place] [address]  [phone]  **Pediatric therapy**  [facility]  [address]  [phone]  **dry needlingVerdie Drown [facility]  [address]  [place], [place] [address]  [phone]  [facility]   (Inside [address] Fitness)  [address]  [place], [place] [address]  [phone]  **dry needling**  **MEDICAID or UNINSURED** [facility] dept. Of Physical Therapy [place], [place] [address] [phone]  [facility]  [address] [place], [place] [address]  [phone]   [facility]  [address]  [place], [place] [address]  [phone]   Doreatha Martin  [facility]  [address] [place], [place] [address]  [phone]   [facility]  [address]  [place], [place] [address]  [phone]   [facility]  [address] [place], [place] [address]  [phone]  [facility]  [address]  [place], [place] [address]  [phone]  AQUATIC  Kathalene Frames [facility]  [employer]  [facility]  [place]  [place]  *Residents only*   [facility] [employer]  *[facility]  Exercise class  [facility]  Exercise class  [facility]  [address]  [place], [place]   [phone]  [facility]  [address]  [place], [place] [address]  [phone]   [place], [place]  [facility]  [address]  [phone]   [facility]  [facility]  [address]  [phone]           or  [address]  [phone]   [facility]   Provides education and support and practical information for coping with arthritis for arthritis sufferers and their families.   When: 12:15 - 1:30 p.m. the second Monday of each month, March through December  Info: Call Rehabilitation Services at [phone]

## 2022-11-25 NOTE — Progress Notes (Signed)
[facility]) [facility] Quality Pharmacy Team Statin Quality Measure Assessment  11/25/2022  Summer Murphy [birth year] [id number]  Per review of chart and payor information, Ms. [name] has a diagnosis of diabetes but is not currently filling a statin prescription.  This places patient into the Statin Use In Patients with Diabetes (SUPD) measure for CMS.    I could not find any documentation of previous trial of a statin or a history of statin intolerance.Her last lipid panel is from [place] 2022.  Please consider getting a current lipid panel and evaluating Ms. [name] for statin therapy.  The 10-year ASCVD risk score (Arnett DK, et al., 2019) is: 13%   Values used to calculate the score:     Age: 18 years     Sex: Female     Is Non-Hispanic African American: No     Diabetic: Yes     Tobacco smoker: No     Systolic Blood Pressure: 118 mmHg     Is BP treated: Yes     HDL Cholesterol: 63 mg/dL     Total Cholesterol: 166 mg/dL 06/29/8117     Component Value Date/Time   CHOL 166 10/11/2020 0000   TRIG 81 10/11/2020 0000   HDL 63 10/11/2020 0000   LDLCALC 88 10/11/2020 0000    Please consider ONE of the following recommendations:  Initiate high intensity statin Atorvastatin 40 mg once daily, #90, 3 refills   Rosuvastatin 20 mg once daily, #90, 3 refills    Initiate moderate intensity          statin with reduced frequency if prior          statin intolerance 1x weekly, #13, 3 refills   2x weekly, #26, 3 refills   3x weekly, #39, 3 refills    Code for past statin intolerance or  other exclusions (required annually)  Provider Requirements: Associate code during an office visit or telehealth encounter  Drug Induced Myopathy G72.0   Myopathy, unspecified G72.9   Myositis, unspecified M60.9   Rhabdomyolysis M62.82   Cirrhosis of liver K74.69   Prediabetes R73.03   PCOS E28.2   Thank you for allowing [facility] pharmacy to be a part of this patient's care.  Dellie Burns, PharmD Clinical Pharmacist [place]  Direct Dial: [phone]

## 2022-11-26 ENCOUNTER — Encounter: Payer: Self-pay | Admitting: Oncology

## 2022-11-26 ENCOUNTER — Ambulatory Visit: Payer: Self-pay | Admitting: Internal Medicine

## 2022-11-27 ENCOUNTER — Other Ambulatory Visit: Payer: Self-pay

## 2022-11-27 ENCOUNTER — Emergency Department
Admission: EM | Admit: 2022-11-27 | Discharge: 2022-11-27 | Disposition: A | Discharge status: Home / Self Care | Payer: 59 | Attending: Emergency Medicine | Admitting: Emergency Medicine

## 2022-11-27 ENCOUNTER — Ambulatory Visit
Admission: RE | Admit: 2022-11-27 | Discharge: 2022-11-27 | Disposition: A | Discharge status: Home / Self Care | Payer: 59 | Source: Home / Self Care | Attending: Neurosurgery | Admitting: Neurosurgery

## 2022-11-27 ENCOUNTER — Ambulatory Visit
Admission: RE | Admit: 2022-11-27 | Discharge: 2022-11-27 | Disposition: A | Discharge status: Home / Self Care | Payer: 59 | Source: Ambulatory Visit | Attending: Neurosurgery | Admitting: Neurosurgery

## 2022-11-27 DIAGNOSIS — M542 Cervicalgia: Secondary | ICD-10-CM

## 2022-11-27 DIAGNOSIS — Z79899 Other long term (current) drug therapy: Secondary | ICD-10-CM | POA: Diagnosis not present

## 2022-11-27 DIAGNOSIS — M4802 Spinal stenosis, cervical region: Secondary | ICD-10-CM

## 2022-11-27 DIAGNOSIS — G4486 Cervicogenic headache: Secondary | ICD-10-CM | POA: Insufficient documentation

## 2022-11-27 DIAGNOSIS — M5010 Cervical disc disorder with radiculopathy, unspecified cervical region: Secondary | ICD-10-CM

## 2022-11-27 MED ORDER — OXYCODONE-ACETAMINOPHEN 5-325 MG PO TABS
1.0000 | ORAL_TABLET | Freq: Once | ORAL | Status: AC
  Administered 2022-11-27: 1 via ORAL
  Filled 2022-11-27: qty 1

## 2022-11-27 MED ORDER — PREDNISONE 10 MG PO TABS: 10.0000 mg | ORAL_TABLET | Freq: Every day | ORAL | 0 refills | Status: DC

## 2022-11-27 MED ORDER — DEXAMETHASONE SODIUM PHOSPHATE 10 MG/ML IJ SOLN
10.0000 mg | Freq: Once | INTRAMUSCULAR | Status: AC
  Administered 2022-11-27: 10 mg via INTRAMUSCULAR
  Filled 2022-11-27: qty 1

## 2022-11-27 MED ORDER — OXYCODONE HCL 5 MG PO TABS: 5.0000 mg | ORAL_TABLET | Freq: Four times a day (QID) | ORAL | 0 refills | Status: DC | PRN

## 2022-11-27 NOTE — ED Provider Notes (Signed)
[place] EMERGENCY DE[facility]MENT AT [facility] Provider Note   CSN: [id number] Arrival date & time: 11/27/22  1708     History  Chief Complaint  Patient presents with   Neck Pain    Summer Murphy is a 67 y.o. female.  Presents to the emergency department valuation of neck pain.  Patient has had neck pain for years.  Had a recent MRI showing cervical spinal stenosis.  She is on Cymbalta, gabapentin, Tylenol.  Patient denies any recent falls trauma or injury.  She states that the pain is moderate to severe along the left side of her neck into the left shoulder.  No chest pain or shortness of breath.  Symptoms are worse with cervical extension.  She is scheduled for cervical ESI injections but has not had them yet.  HPI     Home Medications Prior to Admission medications   Medication Sig Start Date End Date Taking? Authorizing Provider  oxyCODONE (ROXICODONE) 5 MG immediate release tablet Take 1 tablet (5 mg total) by mouth every 6 (six) hours as needed. 11/27/22 11/27/23 Yes Evon Slack, PA-C  Accu-Chek Softclix Lancets lancets Use as instructed once a daily DX R730.01 06/22/22   Lyndon Code, MD  alendronate (FOSAMAX) 70 MG tablet Take 1 tablet (70 mg total) by mouth once a week. Take with a full glass of water on an empty stomach. 10/06/22   [name], NP  Blood Glucose Monitoring Suppl (ACCU-CHEK GUIDE ME) w/Device KIT Use as directed once a day DX R730.01 06/09/22   Lyndon Code, MD  busPIRone (BUSPAR) 10 MG tablet Take 1 tablet (10 mg total) by mouth 2 (two) times daily. 07/21/22   [name], NP  clonazePAM (KLONOPIN) 1 MG tablet Take 0.5 tablets (0.5 mg total) by mouth 2 (two) times daily as needed for anxiety. 10/06/22   [name], NP  diclofenac Sodium (VOLTAREN) 1 % GEL Apply 4 g topically 4 (four) times daily. 08/13/20   Lyndon Code, MD  diltiazem [facility] CD) 240 MG 24 hr capsule TAKE ONE CAPSULE BY MOUTH DAILY 09/04/22   Lyndon Code, MD   DULoxetine (CYMBALTA) 60 MG capsule Take 1 capsule (60 mg total) by mouth at bedtime. 07/21/22   [name], NP  EPINEPHrine 0.3 mg/0.3 mL IJ SOAJ injection Inject 0.3 mg into the muscle as needed for anaphylaxis. 12/18/20   Lyndon Code, MD  fluticasone [facility]) 50 MCG/ACT nasal spray Place 1 spray into both nostrils daily. 09/16/22 09/16/23  Raechel Chute, MD  fluticasone-salmeterol (ADVAIR DISKUS) 500-50 MCG/ACT AEPB Inhale 1 puff into the lungs in the morning and at bedtime. 09/16/22   Raechel Chute, MD  gabapentin (NEURONTIN) 300 MG capsule TAKE 1 CAPSULE(300 MG) BY MOUTH TWICE DAILY 11/20/22   [name], NP  gabapentin (NEURONTIN) 600 MG tablet TAKE 1 TABLET(600 MG) BY MOUTH TWICE DAILY 11/03/22   [name], NP  glucose blood (ACCU-CHEK GUIDE) test strip Use as instructed once daily Dx R73.01 06/09/22   Lyndon Code, MD  ibuprofen (ADVIL) 800 MG tablet Take 1 tablet (800 mg total) by mouth every 8 (eight) hours as needed for moderate pain. 10/28/22   [name], NP  Ipratropium-Albuterol (COMBIVENT RESPIMAT) 20-100 MCG/ACT AERS respimat INHALE 1 PUFF BY MOUTH EVERY 6 HOURS 10/23/22   Abernathy, Arlyss Repress, NP  ipratropium-albuterol (DUONEB) 0.5-2.5 (3) MG/3ML SOLN Take 3 mLs by nebulization 4 (four) times daily. 08/07/22   Nyoka Cowden, MD  lidocaine (XYLOCAINE) 2 %  solution Use as directed 10 mLs in the mouth or throat every 6 (six) hours as needed for mouth pain. 10/06/22   [name], NP  methocarbamol (ROBAXIN) 750 MG tablet Take 1 tablet (750 mg total) by mouth every 6 (six) hours as needed for muscle spasms. 10/28/22   [name], NP  montelukast (SINGULAIR) 10 MG tablet Take 1 tablet (10 mg total) by mouth at bedtime. 07/21/22   [name], NP  nystatin (MYCOSTATIN) 100000 UNIT/ML suspension Take 5 mLs (500,000 Units total) by mouth 4 (four) times daily. 10/06/22   [name], NP  ondansetron (ZOFRAN) 4 MG tablet TAKE 1 TABLET BY MOUTH TWICE  DAILY AS NEEDED 11/06/21   Lyndon Code, MD  pantoprazole (PROTONIX) 40 MG tablet Take 40 mg by mouth 2 (two) times daily.    [provider]  pilocarpine (SALAGEN) 5 MG tablet Take 5 mg by mouth 2 (two) times daily.    [provider]  predniSONE (DELTASONE) 10 MG tablet Take 1 tablet (10 mg total) by mouth daily. 6,5,4,3,2,1 six day taper 11/27/22   Evon Slack, PA-C  rOPINIRole (REQUIP) 0.5 MG tablet TAKE 2 TABLETS BY MOUTH TWICE DAILY FOR RESTLESS LEGS 11/03/22   [name], NP  sennosides-docusate sodium (SENOKOT-S) 8.6-50 MG tablet Take 2 tablets by mouth daily as needed for constipation. Home med 05/13/22   Darlin Priestly, MD  SUMAtriptan (IMITREX) 100 MG tablet Take 1 tablet (100 mg total) by mouth every 2 (two) hours as needed (for migraine headaches.). May repeat in 1 hours if headache persists or recurs. 09/18/21   [name], NP  tirzepatide [facility]) 2.5 MG/0.5ML Pen Inject 2.5 mg into the skin once a week. 11/03/22   [name], NP  traZODone (DESYREL) 150 MG tablet TAKE 1 TABLET(150 MG) BY MOUTH AT BEDTIME 07/21/22   [name], NP      Allergies    Librium [chlordiazepoxide], Metoclopramide, Vanilla, Aspirin, Nsaids, Azithromycin, Buprenorphine hcl, Chlordiazepoxide hcl, Morphine, Sulfa antibiotics, Tolmetin, Amlodipine besylate, and Iron    Review of Systems   Review of Systems  Physical Exam Updated Vital Signs BP 118/78   Pulse (!) 105   Temp 98.8 F (37.1 C) (Oral)   Resp 20   Ht 5' (1.524 m)   Wt 65.3 kg   SpO2 98%   BMI 28.12 kg/m  Physical Exam Constitutional:      Appearance: She is well-developed.  HENT:     Head: Normocephalic and atraumatic.  Eyes:     Conjunctiva/sclera: Conjunctivae normal.  Cardiovascular:     Rate and Rhythm: Normal rate.  Pulmonary:     Effort: Pulmonary effort is normal. No respiratory distress.  Musculoskeletal:        General: Normal range of motion.     Cervical back: Normal range of  motion.     Comments: Pain with neck range of motion.  Positive Spurling's test.  She has normal strength in both upper extremities with no weakness or neurological deficits with normal shoulder abduction, bicep, tricep, grip strength bilaterally.  Negative Hoffmann's test bilaterally..  Skin:    General: Skin is warm.     Findings: No rash.  Neurological:     General: No focal deficit present.     Mental Status: She is alert and oriented to person, place, and time. Mental status is at baseline.     Cranial Nerves: No cranial nerve deficit.     Sensory: No sensory deficit.     Motor:  No weakness.     Gait: Gait normal.  Psychiatric:        Behavior: Behavior normal.        Thought Content: Thought content normal.     ED Results / Procedures / Treatments   Labs (all labs ordered are listed, but only abnormal results are displayed) Labs Reviewed - No data to display  EKG None  Radiology No results found.  Procedures Procedures    Medications Ordered in ED Medications  dexamethasone (DECADRON) injection 10 mg (10 mg Intramuscular Given 11/27/22 2020)  oxyCODONE-acetaminophen (PERCOCET/ROXICET) 5-325 MG per tablet 1 tablet (1 tablet Oral Given 11/27/22 2019)    ED Course/ Medical Decision Making/ A&P                                 Medical Decision Making Risk Prescription drug management.   67 year old female with recent MRI of the neck showing cervical spinal stenosis.  She is scheduled to have ESI injections.  She has had an acute flareup of her pain with no new neurological deficits.  Pain resolved with dexamethasone and oxycodone here in the ED.  She was sent home with a 6-day steroid taper and will take as needed oxycodone for severe breakthrough pain.  She understands signs and symptoms return to the ER for. Final Clinical Impression(s) / ED Diagnoses Final diagnoses:  Neck pain  Cervical spinal stenosis    Rx / DC Orders ED Discharge Orders          Ordered     oxyCODONE (ROXICODONE) 5 MG immediate release tablet  Every 6 hours PRN        11/27/22 2108    predniSONE (DELTASONE) 10 MG tablet  Daily,   Status:  Discontinued        11/27/22 2109    predniSONE (DELTASONE) 10 MG tablet  Daily        11/27/22 2109              Evon Slack, PA-C 11/27/22 2112    Merwyn Katos, MD 11/28/22 [phone]

## 2022-11-27 NOTE — ED Notes (Signed)
Difficulty getting BP due to patient squirming and tensing up. Manual BP checked twice by this RN.

## 2022-11-27 NOTE — ED Triage Notes (Signed)
Pt presents to the ED POV from home. Pt states that she was dropped off. Pt reports spinal stenosis and was seen on 9/4 for pain and numbness in neck that goes down her left arm. Pt was given prescription for ibuprofen. Pt reports that she had xrays done today as well as an MRI, but the MRI was unable to be resulted due to her moving. Pt states that she is supposed to get injections in her neck.

## 2022-11-27 NOTE — Discharge Instructions (Signed)
Please continue with follow-up with neurosurgeon.  You may take oxycodone as needed for severe breakthrough pain.  Return to the ER for any worsening symptoms or any urgent changes in your health such as severe pain, weakness, fevers.

## 2022-11-30 ENCOUNTER — Telehealth: Payer: Self-pay | Admitting: Nurse Practitioner

## 2022-11-30 NOTE — Group Note (Deleted)

## 2022-11-30 NOTE — Telephone Encounter (Signed)
Lvm to schedule ED follow up-Toni 

## 2022-12-01 ENCOUNTER — Telehealth: Payer: Self-pay | Admitting: Nurse Practitioner

## 2022-12-01 NOTE — Telephone Encounter (Signed)
Called pt to schedule ED follow up , pt is seeing Neuro-nm

## 2022-12-10 ENCOUNTER — Encounter: Payer: Self-pay | Admitting: Student in an Organized Health Care Education/Training Program

## 2022-12-10 ENCOUNTER — Ambulatory Visit
Payer: 59 | Attending: Student in an Organized Health Care Education/Training Program | Admitting: Student in an Organized Health Care Education/Training Program

## 2022-12-10 VITALS — BP 131/73 | HR 119 | Temp 97.6°F | Ht 63.0 in | Wt 146.0 lb

## 2022-12-10 DIAGNOSIS — M5412 Radiculopathy, cervical region: Secondary | ICD-10-CM | POA: Diagnosis not present

## 2022-12-10 DIAGNOSIS — M4802 Spinal stenosis, cervical region: Secondary | ICD-10-CM | POA: Diagnosis not present

## 2022-12-10 NOTE — Progress Notes (Signed)
Safety precautions to be maintained throughout the outpatient stay will include: orient to surroundings, keep bed in low position, maintain call bell within reach at all times, provide assistance with transfer out of bed and ambulation.  

## 2022-12-10 NOTE — Patient Instructions (Signed)
  ______________________________________________________________________    Procedure instructions  Stop blood-thinners  Do not eat or drink fluids (other than water) for 6 hours before your procedure  No water for 2 hours before your procedure  Take your blood pressure medicine with a sip of water  Arrive 30 minutes before your appointment  If sedation is planned, bring suitable driver. Pennie Banter, Benedetto Goad, & public transportation are NOT APPROVED)  Carefully read the "Preparing for your procedure" detailed instructions  If you have questions call us at [phone]  ______________________________________________________________________

## 2022-12-10 NOTE — Progress Notes (Signed)
Patient: Summer Murphy  Service Category: E/M  Provider: Edward Jolly, MD  DOB: 1955-10-01  DOS: 12/10/2022  Referring Provider: Lovenia Kim, MD  MRN: [id number]  Setting: Ambulatory outpatient  PCP: [name], NP  Type: New Patient  Specialty: Interventional Pain Management    Location: Office  Delivery: Face-to-face     Primary Reason(s) for Visit: Encounter for initial evaluation of one or more chronic problems (new to examiner) potentially causing chronic pain, and posing a threat to normal musculoskeletal function. (Level of risk: High) CC: Neck Pain  HPI  Summer Murphy is a 67 y.o. year old, female patient, who comes for the first time to our practice referred by Lovenia Kim, MD for our initial evaluation of her chronic pain. She has Severe recurrent major depression without psychotic features (HCC); COPD (chronic obstructive pulmonary disease) (HCC); Barrett esophagus; Calculus of kidney; Calculi, ureter; Diaphragmatic hernia; Corneal graft malfunction; Renal colic; Urge incontinence; Moderate asthma without complication; HTN (hypertension); Acute bronchitis with asthma; Allergic rhinitis; Intractable migraine without status migrainosus; Nausea; Polyneuropathy associated with underlying disease (HCC); Midline low back pain without sciatica; Routine cervical smear; Recurrent sinusitis; Prediabetes; Nonintractable headache; Dysuria; Acute upper respiratory infection; COPD with acute exacerbation (HCC); Diastolic dysfunction; SOB (shortness of breath); Hiatal hernia with GERD; Wheezing; Impaired fasting glucose; Muscle cramps; Restless leg syndrome; Callus of foot; Candidal vaginitis; Other symptoms and signs involving the nervous system; Encounter for general adult medical examination with abnormal findings; Seasonal allergic rhinitis due to pollen; S/P revision of total knee, right; Moderate persistent asthma with acute exacerbation; IDA (iron deficiency anemia); S/P hardware  removal; Esophageal dysphagia; Benign esophageal stricture; Hypocalcemia; CAP (community acquired pneumonia); PNA (pneumonia); Multifocal pneumonia; Hypertensive urgency; Sepsis (HCC); DOE (dyspnea on exertion); Acute asthma exacerbation; Aspiration pneumonia (HCC); Anxiety; Essential hypertension; Acute respiratory failure with hypoxia (HCC); Uncontrolled type 2 diabetes mellitus with hyperglycemia, without long-term current use of insulin (HCC); Chronic asthmatic bronchitis; Foraminal stenosis of cervical region; Cervical disc disorder with radiculopathy of cervical region; Cervicogenic headache; and Cervical radicular pain (left severe) on their problem list. Today she comes in for evaluation of her Neck Pain  Pain Assessment: Location: Left Neck Radiating: radiates down left arm between shoulder and elbow Onset: More than a month ago Duration: Chronic pain Quality: Numbness, Stabbing, Constant Severity: 10-Worst pain ever/10 (subjective, self-reported pain score)  Effect on ADL: limits ADLS Timing: Constant Modifying factors: meds, ice,, heat BP: 131/73  HR: (!) 119  Onset and Duration: Sudden and Present less than 3 months Cause of pain: Unknown Severity: No change since onset, NAS-11 at its worse: 10/10, NAS-11 at its best: 10/10, NAS-11 now: 10/10, and NAS-11 on the average: 10/10 Timing: Not influenced by the time of the day Aggravating Factors:  pt did not mark any aggravating factors Alleviating Factors: Cold packs, Medications, and Sitting Associated Problems: Spasms and Pain that wakes patient up Quality of Pain: Aching, Burning, Sharp, Shooting, and Stabbing Previous Examinations or Tests: CT scan and X-rays Previous Treatments: The patient denies treatments  Patient is a pleasant 67 year old female who presents with a chief complaint of neck pain with radiation into her left bicep along with numbness and tingling in her left fingers.  This started approximately 1 month ago.   No inciting or traumatic event.  No history of falls.  She is on gabapentin currently which is not providing much relief.  She presented to the emergency department earlier this month for worsening neck pain and left shoulder and arm  pain.  Her gabapentin was increased at that time however she is not seeing any benefit from it.  She has had x-rays and MRIs done results of which are below.  She is also tried ibuprofen and Aleve with limited response.  She has been on Robaxin in the past.  She states that she had a tooth extraction last week and received oxycodone for that.  She has been seen by Dr. Katrinka Blazing with neurosurgery.  He has referred her here to consider cervical epidural steroid injection.  Of note, patient has a history of lumbar spine surgery that was done over 20 years ago.  She also has a history of bilateral knee replacement.   Meds   Current Outpatient Medications:    Accu-Chek Softclix Lancets lancets, Use as instructed once a daily DX R730.01, Disp: 100 each, Rfl: 12   alendronate (FOSAMAX) 70 MG tablet, Take 1 tablet (70 mg total) by mouth once a week. Take with a full glass of water on an empty stomach., Disp: 12 tablet, Rfl: 3   Blood Glucose Monitoring Suppl (ACCU-CHEK GUIDE ME) w/Device KIT, Use as directed once a day DX R730.01, Disp: 1 kit, Rfl: 0   busPIRone (BUSPAR) 10 MG tablet, Take 1 tablet (10 mg total) by mouth 2 (two) times daily., Disp: 60 tablet, Rfl: 2   clonazePAM (KLONOPIN) 1 MG tablet, Take 0.5 tablets (0.5 mg total) by mouth 2 (two) times daily as needed for anxiety., Disp: 30 tablet, Rfl: 2   diclofenac Sodium (VOLTAREN) 1 % GEL, Apply 4 g topically 4 (four) times daily., Disp: 350 g, Rfl: 1   diltiazem (CARDIZEM CD) 240 MG 24 hr capsule, TAKE ONE CAPSULE BY MOUTH DAILY, Disp: 90 capsule, Rfl: 3   DULoxetine (CYMBALTA) 60 MG capsule, Take 1 capsule (60 mg total) by mouth at bedtime., Disp: 90 capsule, Rfl: 1   EPINEPHrine 0.3 mg/0.3 mL IJ SOAJ injection, Inject  0.3 mg into the muscle as needed for anaphylaxis., Disp: 1 each, Rfl: 3   fluticasone (FLONASE) 50 MCG/ACT nasal spray, Place 1 spray into both nostrils daily., Disp: 18.2 mL, Rfl: 2   fluticasone-salmeterol (ADVAIR DISKUS) 500-50 MCG/ACT AEPB, Inhale 1 puff into the lungs in the morning and at bedtime., Disp: 60 each, Rfl: 12   gabapentin (NEURONTIN) 300 MG capsule, TAKE 1 CAPSULE(300 MG) BY MOUTH TWICE DAILY, Disp: 60 capsule, Rfl: 2   gabapentin (NEURONTIN) 600 MG tablet, TAKE 1 TABLET(600 MG) BY MOUTH TWICE DAILY, Disp: 60 tablet, Rfl: 3   glucose blood (ACCU-CHEK GUIDE) test strip, Use as instructed once daily Dx R73.01, Disp: 100 each, Rfl: 12   ibuprofen (ADVIL) 800 MG tablet, Take 1 tablet (800 mg total) by mouth every 8 (eight) hours as needed for moderate pain., Disp: 60 tablet, Rfl: 1   Ipratropium-Albuterol (COMBIVENT RESPIMAT) 20-100 MCG/ACT AERS respimat, INHALE 1 PUFF BY MOUTH EVERY 6 HOURS, Disp: 4 g, Rfl: 3   ipratropium-albuterol (DUONEB) 0.5-2.5 (3) MG/3ML SOLN, Take 3 mLs by nebulization 4 (four) times daily., Disp: 360 mL, Rfl: 11   lidocaine (XYLOCAINE) 2 % solution, Use as directed 10 mLs in the mouth or throat every 6 (six) hours as needed for mouth pain., Disp: 400 mL, Rfl: 1   methocarbamol (ROBAXIN) 750 MG tablet, Take 1 tablet (750 mg total) by mouth every 6 (six) hours as needed for muscle spasms., Disp: 60 tablet, Rfl: 1   montelukast (SINGULAIR) 10 MG tablet, Take 1 tablet (10 mg total) by mouth at bedtime., Disp:  90 tablet, Rfl: 1   nystatin (MYCOSTATIN) 100000 UNIT/ML suspension, Take 5 mLs (500,000 Units total) by mouth 4 (four) times daily., Disp: 120 mL, Rfl: 0   ondansetron (ZOFRAN) 4 MG tablet, TAKE 1 TABLET BY MOUTH TWICE DAILY AS NEEDED, Disp: 45 tablet, Rfl: 1   oxyCODONE (ROXICODONE) 5 MG immediate release tablet, Take 1 tablet (5 mg total) by mouth every 6 (six) hours as needed., Disp: 15 tablet, Rfl: 0   pantoprazole (PROTONIX) 40 MG tablet, Take 40 mg by  mouth 2 (two) times daily., Disp: , Rfl:    pilocarpine (SALAGEN) 5 MG tablet, Take 5 mg by mouth 2 (two) times daily., Disp: , Rfl:    predniSONE (DELTASONE) 10 MG tablet, Take 1 tablet (10 mg total) by mouth daily. 6,5,4,3,2,1 six day taper, Disp: 21 tablet, Rfl: 0   rOPINIRole (REQUIP) 0.5 MG tablet, TAKE 2 TABLETS BY MOUTH TWICE DAILY FOR RESTLESS LEGS, Disp: 360 tablet, Rfl: 1   sennosides-docusate sodium (SENOKOT-S) 8.6-50 MG tablet, Take 2 tablets by mouth daily as needed for constipation. Home med, Disp: , Rfl:    SUMAtriptan (IMITREX) 100 MG tablet, Take 1 tablet (100 mg total) by mouth every 2 (two) hours as needed (for migraine headaches.). May repeat in 1 hours if headache persists or recurs., Disp: 10 tablet, Rfl: 3   tirzepatide (MOUNJARO) 2.5 MG/0.5ML Pen, Inject 2.5 mg into the skin once a week., Disp: 6 mL, Rfl: 2   traZODone (DESYREL) 150 MG tablet, TAKE 1 TABLET(150 MG) BY MOUTH AT BEDTIME, Disp: 30 tablet, Rfl: 2  Imaging Review  Cervical Imaging: Cervical MR wo contrast: Results for orders placed during the hospital encounter of 11/18/22  MR CERVICAL SPINE WO CONTRAST  Narrative CLINICAL DATA:  Posterior neck pain running down the left arm with  EXAM: MRI CERVICAL SPINE WITHOUT CONTRAST  TECHNIQUE: Multiplanar, multisequence MR imaging of the cervical spine was performed. No intravenous contrast was administered.  COMPARISON:  Numbness.  Cervical spine MRI 10/29/2009  FINDINGS: Image quality is significantly degraded by motion artifact.  Alignment: Normal.  Vertebrae: Vertebral body heights are preserved. Background marrow signal is normal. There is no suspicious marrow signal abnormality or marrow edema.  Cord: There is no definite cord signal abnormality, though evaluation is significantly degraded by motion artifact.  Posterior Fossa, vertebral arteries, paraspinal tissues: The imaged posterior fossa is unremarkable. The vertebral artery flow voids  are normal. The paraspinal soft tissues are unremarkable.  Disc levels:  C2-C3: No significant spinal canal or neural foraminal stenosis  C3-C4: There is a posterior disc osteophyte complex with right worse than left uncovertebral ridging and bilateral facet arthropathy with ligamentum flavum thickening resulting in at least moderate spinal canal stenosis with likely mass effect on the cord, and at least moderate right and mild left neural foraminal stenosis.  C4-C5: There is a shallow posterior disc osteophyte complex with probable mild-to-moderate spinal canal stenosis. There is uncovertebral and facet arthropathy, but the neural foramina are not well assessed due to motion artifact.  C5-C6: There is uncovertebral and facet arthropathy with probable moderate spinal canal stenosis, and possibly severe bilateral neural foraminal stenosis.  C6-C7: There is suspected disc bulge contributing to mild spinal canal stenosis and possibly severe bilateral neural foraminal stenosis  C7-T1: No significant spinal canal or neural foraminal stenosis.  IMPRESSION: 1. Image quality is significantly degraded by motion artifact, limiting evaluation of the spinal canal and neural foramina. 2. Within this confine, there is probably at least moderate spinal  canal stenosis at C3-C4 and C5-C6, mild-to-moderate spinal canal stenosis at C4-C5, and mild spinal canal stenosis at C6-C7. Cord signal is not well assessed due to artifact. CT myelogram may be considered as indicated given less susceptbility to motion artifact. 3. At least moderate right and mild left neural foraminal stenosis at C3-C4, and possibly severe bilateral neural foraminal stenosis at C5-C6 and C6-C7.   Electronically Signed By: Lesia Hausen M.D. On: 11/18/2022 16:36  DG Cervical Spine Complete  Narrative CLINICAL DATA:  Chronic neck pain.  EXAM: CERVICAL SPINE - COMPLETE 4+ VIEW  COMPARISON:   11/10/2022.  FINDINGS: Lateral neutral, flexion and extension views as well as AP view were acquired. C6 through T1 are not well visualized on the lateral view due to superimposed soft tissues.  No fracture, bone lesion or spondylolisthesis. Straightened cervical lordosis.  No subluxation with flexion or extension.  Mild loss of disc height throughout the visualized cervical spine. Bilateral facet degenerative changes.  Soft tissues are unremarkable.  IMPRESSION: 1. No fracture or acute finding. 2. No subluxation with flexion or extension. 3. No change from the recent prior exams.   Electronically Signed By: Amie Portland M.D. On: 12/09/2022 10:28  DG Knee 1-2 Views Right  Narrative CLINICAL DATA:  S/p right knee revision patella and tibial polyethylene  EXAM: RIGHT KNEE - 1-2 VIEW  COMPARISON:  None.  FINDINGS: RIGHT knee arthroplasty hardware appears intact and appropriately positioned. Osseous alignment is anatomic. Expected postsurgical changes of the overlying soft tissues.  IMPRESSION: Normal postoperative appearance of the RIGHT knee. No evidence of surgical complicating feature.   Electronically Signed By: Bary Richard M.D. On: 11/14/2019 09:50  Knee-L DG 1-2 views: Results for orders placed during the hospital encounter of 02/16/20  DG Knee 1-2 Views Left  Narrative CLINICAL DATA:  Initial evaluation for acute trauma, fall.  EXAM: LEFT KNEE - 1-2 VIEW  COMPARISON:  Prior radiograph from 10/26/2013.  FINDINGS: Cemented left total knee arthroplasty in place. There is an acute comminuted periprosthetic fracture involving the distal left femur with impaction. Up to 1.3 cm of posterior displacement. Tibial and patellar components remain grossly intact. Diffuse soft tissue swelling seen about the knee. Associated joint effusion.  IMPRESSION: Acute comminuted periprosthetic fracture involving the distal left femur.   Electronically  Signed By: Rise Mu M.D. On: 02/16/2020 01:43    Narrative CLINICAL DATA:  Pain and swelling.  EXAM: LEFT FOOT - COMPLETE 3+ VIEW  COMPARISON:  None.  FINDINGS: Diffuse degenerative change particularly prominent about the second metatarsal phalangeal joint. Fusion noted of the proximal and distal phalanges of the second digit. No acute bony abnormality. No radiopaque foreign bodies.  IMPRESSION: Diffuse degenerative change, particular prominent second MTP joint. Fusion of the of proximal and middle phalanx of the left second digit noted.   Electronically Signed By: Maisie Fus  Register On: 06/12/2016 14:48  DG Wrist Complete Left  Narrative CLINICAL DATA:  Status post fall, hitting left wrist on vinyl flooring. Left wrist pain. Initial encounter.  EXAM: LEFT WRIST - COMPLETE 3+ VIEW  COMPARISON:  None.  FINDINGS: There is no evidence of fracture or dislocation. The carpal rows are intact, and demonstrate normal alignment. The joint spaces are preserved.  Mild soft tissue swelling is noted about the wrist.  IMPRESSION: No evidence of fracture or dislocation.   Electronically Signed By: Roanna Raider M.D. On: 03/01/2015 00:18   Complexity Note: Imaging results reviewed.  ROS  Cardiovascular: High blood pressure Pulmonary or Respiratory: Wheezing and difficulty taking a deep full breath (Asthma) Neurological: No reported neurological signs or symptoms such as seizures, abnormal skin sensations, urinary and/or fecal incontinence, being born with an abnormal open spine and/or a tethered spinal cord Psychological-Psychiatric: Anxiousness and Depressed Gastrointestinal: Reflux or heatburn Genitourinary: Kidney disease Hematological: No reported hematological signs or symptoms such as prolonged bleeding, low or poor functioning platelets, bruising or bleeding easily, hereditary bleeding problems, low energy levels due to low  hemoglobin or being anemic Endocrine:  pt states she is borderline diabetic and on Mounjaro Rheumatologic: No reported rheumatological signs and symptoms such as fatigue, joint pain, tenderness, swelling, redness, heat, stiffness, decreased range of motion, with or without associated rash Musculoskeletal: Negative for myasthenia gravis, muscular dystrophy, multiple sclerosis or malignant hyperthermia Work History: Disabled  Allergies  Ms. Clemence is allergic to librium [chlordiazepoxide], metoclopramide, vanilla, aspirin, nsaids, azithromycin, buprenorphine hcl, chlordiazepoxide hcl, morphine, sulfa antibiotics, tolmetin, amlodipine besylate, and iron.  Laboratory Chemistry Profile   Renal Lab Results  Component Value Date   BUN 22 08/07/2022   CREATININE 0.59 08/07/2022   BCR 17 01/15/2022   GFRAA >60 11/16/2019   GFRNONAA >60 08/07/2022   SPECGRAV 1.023 10/06/2022   PHUR 5.0 10/06/2022   PROTEINUR Negative 10/06/2022     Electrolytes Lab Results  Component Value Date   NA 137 08/07/2022   K 3.6 08/07/2022   CL 103 08/07/2022   CALCIUM 9.4 09/30/2022   MG 1.8 04/03/2019   PHOS 3.3 04/03/2019     Hepatic Lab Results  Component Value Date   AST 28 06/01/2022   ALT 28 06/01/2022   ALBUMIN 3.6 06/01/2022   ALKPHOS 74 06/01/2022   LIPASE 25 05/12/2022     ID Lab Results  Component Value Date   HIV Non Reactive 05/13/2022   SARSCOV2NAA NEGATIVE 06/01/2022   STAPHAUREUS NEGATIVE 11/07/2019   MRSAPCR NEGATIVE 11/07/2019     Bone Lab Results  Component Value Date   VD25OH 38.1 08/29/2021     Endocrine Lab Results  Component Value Date   GLUCOSE 97 08/07/2022   GLUCOSEU Negative 10/06/2022   HGBA1C 6.8 (H) 05/13/2022   TSH 2.079 09/30/2022   FREET4 0.89 09/30/2022     Neuropathy Lab Results  Component Value Date   VITAMINB12 1,944 (H) 09/30/2022   FOLATE >40.0 09/30/2022   HGBA1C 6.8 (H) 05/13/2022   HIV Non Reactive 05/13/2022     CNS No results  found for: "COLORCSF", "APPEARCSF", "RBCCOUNTCSF", "WBCCSF", "POLYSCSF", "LYMPHSCSF", "EOSCSF", "PROTEINCSF", "GLUCCSF", "JCVIRUS", "CSFOLI", "IGGCSF", "LABACHR", "ACETBL"   Inflammation (CRP: Acute  ESR: Chronic) Lab Results  Component Value Date   CRP 0.5 09/30/2022   ESRSEDRATE 11 08/07/2022   LATICACIDVEN 1.2 06/01/2022     Rheumatology Lab Results  Component Value Date   RF 11.6 09/30/2022   ANA Negative 09/30/2022   LABURIC 3.5 04/03/2019     Coagulation Lab Results  Component Value Date   INR 1.1 05/13/2022   LABPROT 14.1 05/13/2022   APTT 26.7 10/17/2013   PLT 272 09/30/2022   DDIMER 1.08 (H) 08/07/2022     Cardiovascular Lab Results  Component Value Date   BNP 32.4 08/07/2022   CKTOTAL 59 06/29/2012   CKMB 1.5 06/29/2012   TROPONINI <0.03 01/20/2016   HGB 11.2 (L) 09/30/2022   HCT 34.5 (L) 09/30/2022     Screening Lab Results  Component Value Date   SARSCOV2NAA NEGATIVE 06/01/2022   STAPHAUREUS NEGATIVE 11/07/2019  MRSAPCR NEGATIVE 11/07/2019   HIV Non Reactive 05/13/2022     Cancer No results found for: "CEA", "CA125", "LABCA2"   Allergens No results found for: "ALMOND", "APPLE", "ASPARAGUS", "AVOCADO", "BANANA", "BARLEY", "BASIL", "BAYLEAF", "GREENBEAN", "LIMABEAN", "WHITEBEAN", "BEEFIGE", "REDBEET", "BLUEBERRY", "BROCCOLI", "CABBAGE", "MELON", "CARROT", "CASEIN", "CASHEWNUT", "CAULIFLOWER", "CELERY"     Note: Lab results reviewed.  PFSH  Drug: Ms. Memon  reports no history of drug use. Alcohol:  reports no history of alcohol use. Tobacco:  reports that she has never smoked. She has been exposed to tobacco smoke. She has never used smokeless tobacco. Medical:  has a past medical history of Acid reflux, Anemia, Anxiety, Arrhythmia, Arthritis, Asthma, Depression, Fever blister, Hematuria, History of kidney stones, Hypertension, Hypoglycemia, Left flank pain, Migraine, Pre-diabetes, Restless leg, and Yeast vaginitis. Family: family history  includes Breast cancer in her maternal aunt; Diabetes in her brother; Hypertension in her brother and mother; Kidney Stones in her father; Prostate cancer in her father; Stroke in her mother.  Past Surgical History:  Procedure Laterality Date   ABDOMINAL HYSTERECTOMY     AMPUTATION TOE Left 07/31/2016   Procedure: AMPUTATION TOE/MPJ 2nd toe;  Surgeon: Linus Galas, DPM;  Location: [facility] ORS;  Service: Podiatry;  Laterality: Left;   APPENDECTOMY  1990   BACK SURGERY     low back   BREAST SURGERY     bilateral breast reduction   CARDIAC ELECTROPHYSIOLOGY STUDY AND ABLATION     CHOLECYSTECTOMY  1990   COLONOSCOPY WITH PROPOFOL N/A 01/04/2017   Procedure: COLONOSCOPY WITH PROPOFOL;  Surgeon: Scot Jun, MD;  Location: [facility] ENDOSCOPY;  Service: Endoscopy;  Laterality: N/A;   CORNEAL TRANSPLANT     ESOPHAGOGASTRODUODENOSCOPY N/A 04/09/2021   Procedure: ESOPHAGOGASTRODUODENOSCOPY (EGD);  Surgeon: Toney Reil, MD;  Location: [facility] ENDOSCOPY;  Service: Gastroenterology;  Laterality: N/A;   ESOPHAGOGASTRODUODENOSCOPY (EGD) WITH PROPOFOL N/A 01/04/2017   Procedure: ESOPHAGOGASTRODUODENOSCOPY (EGD) WITH PROPOFOL;  Surgeon: Scot Jun, MD;  Location: [facility] ENDOSCOPY;  Service: Endoscopy;  Laterality: N/A;   EXCISION BONE CYST Left 07/31/2016   Procedure: EXCISION BONE CYST/exostectomy 28124/left 2nd;  Surgeon: Linus Galas, DPM;  Location: [facility] ORS;  Service: Podiatry;  Laterality: Left;   EXTRACORPOREAL SHOCK WAVE LITHOTRIPSY Left 09/12/2015   Procedure: EXTRACORPOREAL SHOCK WAVE LITHOTRIPSY (ESWL);  Surgeon: Vanna Scotland, MD;  Location: [facility] ORS;  Service: Urology;  Laterality: Left;   FRACTURE SURGERY     left foot   HARDWARE REMOVAL Left 11/14/2020   Procedure: HARDWARE REMOVAL;  Surgeon: Kennedy Bucker, MD;  Location: [facility] ORS;  Service: Orthopedics;  Laterality: Left;   HH repair     Fundoplication   JOINT REPLACEMENT Bilateral 2013,2014   total knees   LAPAROSCOPIC  HYSTERECTOMY     LITHOTRIPSY     periprosthetic supracondylar fracture of left femur  02/16/2020   [facility]   TONSILLECTOMY     TOTAL KNEE REVISION Right 11/14/2019   Procedure: Revision patella and tibial polyethylene;  Surgeon: Kennedy Bucker, MD;  Location: [facility] ORS;  Service: Orthopedics;  Laterality: Right;   URETEROSCOPY     Active Ambulatory Problems    Diagnosis Date Noted   Severe recurrent major depression without psychotic features (HCC) 09/10/2014   COPD (chronic obstructive pulmonary disease) (HCC) 09/10/2014   Barrett esophagus 07/07/2010   Calculus of kidney 04/26/2012   Calculi, ureter 04/26/2013   Diaphragmatic hernia 10/27/2010   Corneal graft malfunction 08/17/2012   Renal colic 04/26/2012   Urge incontinence 04/26/2012   Moderate asthma without complication  01/20/2016   HTN (hypertension) 07/01/2017   Acute bronchitis with asthma 07/01/2017   Allergic rhinitis 07/01/2017   Intractable migraine without status migrainosus 04/22/2018   Nausea 04/22/2018   Polyneuropathy associated with underlying disease (HCC) 04/22/2018   Midline low back pain without sciatica 04/22/2018   Routine cervical smear 04/22/2018   Recurrent sinusitis 08/15/2018   Prediabetes 08/15/2018   Nonintractable headache 08/15/2018   Dysuria 08/15/2018   Acute upper respiratory infection 08/25/2018   COPD with acute exacerbation (HCC) 09/28/2018   Diastolic dysfunction 12/14/2018   SOB (shortness of breath) 12/14/2018   Hiatal hernia with GERD 12/14/2018   Wheezing 12/14/2018   Impaired fasting glucose 12/14/2018   Muscle cramps 04/02/2019   Restless leg syndrome 04/30/2019   Callus of foot 08/13/2019   Candidal vaginitis 08/13/2019   Other symptoms and signs involving the nervous system 08/13/2019   Encounter for general adult medical examination with abnormal findings 08/13/2019   Seasonal allergic rhinitis due to pollen 09/20/2019   S/P revision of total knee, right 11/14/2019    Moderate persistent asthma with acute exacerbation 08/21/2020   IDA (iron deficiency anemia) 10/18/2020   S/P hardware removal 11/14/2020   Esophageal dysphagia    Benign esophageal stricture    Hypocalcemia 07/31/2021   CAP (community acquired pneumonia) 04/24/2022   PNA (pneumonia) 04/24/2022   Multifocal pneumonia 05/12/2022   Hypertensive urgency 05/12/2022   Sepsis (HCC) 05/12/2022   DOE (dyspnea on exertion) 05/13/2022   Acute asthma exacerbation 06/01/2022   Aspiration pneumonia (HCC) 06/02/2022   Anxiety 06/02/2022   Essential hypertension 06/02/2022   Acute respiratory failure with hypoxia (HCC) 06/02/2022   Uncontrolled type 2 diabetes mellitus with hyperglycemia, without long-term current use of insulin (HCC) 06/03/2022   Chronic asthmatic bronchitis 06/12/2022   Foraminal stenosis of cervical region 11/25/2022   Cervical disc disorder with radiculopathy of cervical region 11/25/2022   Cervicogenic headache 11/25/2022   Cervical radicular pain (left severe) 12/10/2022   Resolved Ambulatory Problems    Diagnosis Date Noted   No Resolved Ambulatory Problems   Past Medical History:  Diagnosis Date   Acid reflux    Anemia    Arrhythmia    Arthritis    Asthma    Depression    Fever blister    Hematuria    History of kidney stones    Hypertension    Hypoglycemia    Left flank pain    Migraine    Pre-diabetes    Restless leg    Yeast vaginitis    Constitutional Exam  General appearance: Well nourished, well developed, and well hydrated. In no apparent acute distress Vitals:   12/10/22 0803 12/10/22 0810  BP: (!) 141/107 131/73  Pulse: (!) 119   Temp: 97.6 F (36.4 C)   SpO2: 97%   Weight: 146 lb (66.2 kg)   Height: 5\' 3"  (1.6 m)    BMI Assessment: Estimated body mass index is 25.86 kg/m as calculated from the following:   Height as of this encounter: 5\' 3"  (1.6 m).   Weight as of this encounter: 146 lb (66.2 kg).  BMI interpretation table: BMI  level Category Range association with higher incidence of chronic pain  <18 kg/m2 Underweight   18.5-24.9 kg/m2 Ideal body weight   25-29.9 kg/m2 Overweight Increased incidence by 20%  30-34.9 kg/m2 Obese (Class I) Increased incidence by 68%  35-39.9 kg/m2 Severe obesity (Class II) Increased incidence by 136%  >40 kg/m2 Extreme obesity (Class III) Increased incidence by  254%   Patient's current BMI Ideal Body weight  Body mass index is 25.86 kg/m. Ideal body weight: 52.4 kg (115 lb 8.3 oz) Adjusted ideal body weight: 57.9 kg (127 lb 11.4 oz)   BMI Readings from Last 4 Encounters:  12/10/22 25.86 kg/m  11/27/22 28.12 kg/m  11/25/22 28.08 kg/m  10/28/22 25.93 kg/m   Wt Readings from Last 4 Encounters:  12/10/22 146 lb (66.2 kg)  11/27/22 144 lb (65.3 kg)  11/25/22 143 lb 12.8 oz (65.2 kg)  10/28/22 146 lb 6.4 oz (66.4 kg)    Psych/Mental status: Alert, oriented x 3 (person, place, & time)       Eyes: PERLA Respiratory: No evidence of acute respiratory distress  Cervical Spine Area Exam  Skin & Axial Inspection: No masses, redness, edema, swelling, or associated skin lesions Alignment: Symmetrical Functional ROM: Pain restricted ROM, to the left Stability: No instability detected Muscle Tone/Strength: Functionally intact. No obvious neuro-muscular anomalies detected. Sensory (Neurological): Dermatomal pain pattern left C5/6 Palpation: No palpable anomalies             + Spurlings left Upper Extremity (UE) Exam    Side: Right upper extremity  Side: Left upper extremity  Skin & Extremity Inspection: Skin color, temperature, and hair growth are WNL. No peripheral edema or cyanosis. No masses, redness, swelling, asymmetry, or associated skin lesions. No contractures.  Skin & Extremity Inspection: Skin color, temperature, and hair growth are WNL. No peripheral edema or cyanosis. No masses, redness, swelling, asymmetry, or associated skin lesions. No contractures.  Functional  ROM: Unrestricted ROM          Functional ROM: Unrestricted ROM          Muscle Tone/Strength: Functionally intact. No obvious neuro-muscular anomalies detected.  Muscle Tone/Strength: Functionally intact. No obvious neuro-muscular anomalies detected.  Sensory (Neurological): Unimpaired          Sensory (Neurological): Dermatomal pain pattern          Palpation: No palpable anomalies              Palpation: No palpable anomalies              Provocative Test(s):  Phalen's test: deferred Tinel's test: deferred Apley's scratch test (touch opposite shoulder):  Action 1 (Across chest): deferred Action 2 (Overhead): deferred Action 3 (LB reach): deferred   Provocative Test(s):  Phalen's test: deferred Tinel's test: deferred Apley's scratch test (touch opposite shoulder):  Action 1 (Across chest): Decreased ROM Action 2 (Overhead): Decreased ROM Action 3 (LB reach): Decreased ROM     Assessment  Primary Diagnosis & Pertinent Problem List: The primary encounter diagnosis was Cervical radicular pain (left severe). Diagnoses of Foraminal stenosis of cervical region (severe b/l C5/6; C6/7) and Spinal stenosis in cervical region were also pertinent to this visit.  Visit Diagnosis (New problems to examiner): 1. Cervical radicular pain (left severe)   2. Foraminal stenosis of cervical region (severe b/l C5/6; C6/7)   3. Spinal stenosis in cervical region    Plan of Care   I reviewed the cervical MRI with her in detail.  Discussed risks and potential benefits of cervical epidural steroid injection.  Referral to physical therapy as below.    1. Cervical radicular pain (left severe) - Cervical Epidural Injection; Future - Ambulatory referral to Physical Therapy  2. Foraminal stenosis of cervical region (severe b/l C5/6; C6/7) - Cervical Epidural Injection; Future - Ambulatory referral to Physical Therapy  3. Spinal stenosis in cervical region -  Cervical Epidural Injection; Future -  Ambulatory referral to Physical Therapy    Procedure Orders         Cervical Epidural Injection       Provider-requested follow-up: Return in about 13 days (around 12/23/2022) for LEFT C-ESI, in clinic IV Versed.  Future Appointments  Date Time Provider Department Center  12/21/2022  2:30 PM Raechel Chute, MD [facility] None  12/30/2022  2:00 PM [name], NP [facility] None  01/29/2023  1:15 PM [facility] None  02/01/2023  1:15 PM Rickard Patience, MD [facility] None  10/07/2023  2:00 PM [name], NP [facility] None    Duration of encounter: 60 minutes.  Total time on encounter, as per AMA guidelines included both the face-to-face and non-face-to-face time personally spent by the physician and/or other qualified health care professional(s) on the day of the encounter (includes time in activities that require the physician or other qualified health care professional and does not include time in activities normally performed by clinical staff). Physician's time may include the following activities when performed: Preparing to see the patient (e.g., pre-charting review of records, searching for previously ordered imaging, lab work, and nerve conduction tests) Review of prior analgesic pharmacotherapies. Reviewing PMP Interpreting ordered tests (e.g., lab work, imaging, nerve conduction tests) Performing post-procedure evaluations, including interpretation of diagnostic procedures Obtaining and/or reviewing separately obtained history Performing a medically appropriate examination and/or evaluation Counseling and educating the patient/family/caregiver Ordering medications, tests, or procedures Referring and communicating with other health care professionals (when not separately reported) Documenting clinical information in the electronic or other health record Independently interpreting results (not separately reported) and communicating results to the patient/  family/caregiver Care coordination (not separately reported)  Note by: Edward Jolly, MD (TTS technology used. I apologize for any typographical errors that were not detected and corrected.) Date: 12/10/2022; Time: 8:52 AM

## 2022-12-21 ENCOUNTER — Ambulatory Visit (INDEPENDENT_AMBULATORY_CARE_PROVIDER_SITE_OTHER): Payer: 59 | Admitting: Student in an Organized Health Care Education/Training Program

## 2022-12-21 ENCOUNTER — Encounter: Payer: Self-pay | Admitting: Student in an Organized Health Care Education/Training Program

## 2022-12-21 VITALS — BP 114/60 | HR 100 | Temp 97.6°F | Ht 63.0 in | Wt 143.0 lb

## 2022-12-21 DIAGNOSIS — J4489 Other specified chronic obstructive pulmonary disease: Secondary | ICD-10-CM | POA: Diagnosis not present

## 2022-12-21 MED ORDER — FLUTICASONE-SALMETEROL 250-50 MCG/ACT IN AEPB
1.0000 | INHALATION_SPRAY | Freq: Two times a day (BID) | RESPIRATORY_TRACT | 12 refills | Status: DC
Start: 2022-12-21 — End: 2023-12-14

## 2022-12-21 NOTE — Progress Notes (Signed)
Assessment & Plan:   #Moderate Persistent Asthma  Patient with a history of asthma and is presenting for follow up. During her prior visit with me, she had just recovered from an asthma exacerbation and I switched her treatment plan to high dose ICS/LABA. She has since been maintained on 500-50 of Wixella with significant improvement.  Given she's significantly improved with ICS/LABA, I will step down her therapy to the 250-50, with short term follow up. Given her age and weight, it is prudent to lower her systemic exposure to steroids, especially with her asthma being well controlled. Should her asthma prove to be difficult to control, I would consider initiation of biologics (she has a history of eosinophilia up to 800, and options could include dupilumab, tezepelumab, and mepolizumab).  - fluticasone-salmeterol (WIXELA INHUB) 250-50 MCG/ACT AEPB; Inhale 1 puff into the lungs in the morning and at bedtime.  Dispense: 60 each; Refill: 12   Return in about 3 months (around 03/22/2023).  I spent 30 minutes caring for this patient today, including preparing to see the patient, obtaining a medical history , reviewing a separately obtained history, performing a medically appropriate examination and/or evaluation, counseling and educating the patient/family/caregiver, ordering medications, tests, or procedures, documenting clinical information in the electronic health record, and independently interpreting results (not separately reported/billed) and communicating results to the patient/family/caregiver  Raechel Chute, MD [facility] 12/21/2022 2:48 PM    End of visit medications:  Meds ordered this encounter  Medications   fluticasone-salmeterol (WIXELA INHUB) 250-50 MCG/ACT AEPB    Sig: Inhale 1 puff into the lungs in the morning and at bedtime.    Dispense:  60 each    Refill:  12     Current Outpatient Medications:    Accu-Chek Softclix Lancets lancets, Use as  instructed once a daily DX R730.01, Disp: 100 each, Rfl: 12   alendronate (FOSAMAX) 70 MG tablet, Take 1 tablet (70 mg total) by mouth once a week. Take with a full glass of water on an empty stomach., Disp: 12 tablet, Rfl: 3   Blood Glucose Monitoring Suppl (ACCU-CHEK GUIDE ME) w/Device KIT, Use as directed once a day DX R730.01, Disp: 1 kit, Rfl: 0   busPIRone (BUSPAR) 10 MG tablet, Take 1 tablet (10 mg total) by mouth 2 (two) times daily., Disp: 60 tablet, Rfl: 2   clonazePAM (KLONOPIN) 1 MG tablet, Take 0.5 tablets (0.5 mg total) by mouth 2 (two) times daily as needed for anxiety., Disp: 30 tablet, Rfl: 2   diclofenac Sodium (VOLTAREN) 1 % GEL, Apply 4 g topically 4 (four) times daily., Disp: 350 g, Rfl: 1   diltiazem (CARDIZEM CD) 240 MG 24 hr capsule, TAKE ONE CAPSULE BY MOUTH DAILY, Disp: 90 capsule, Rfl: 3   DULoxetine (CYMBALTA) 60 MG capsule, Take 1 capsule (60 mg total) by mouth at bedtime., Disp: 90 capsule, Rfl: 1   EPINEPHrine 0.3 mg/0.3 mL IJ SOAJ injection, Inject 0.3 mg into the muscle as needed for anaphylaxis., Disp: 1 each, Rfl: 3   fluticasone (FLONASE) 50 MCG/ACT nasal spray, Place 1 spray into both nostrils daily., Disp: 18.2 mL, Rfl: 2   fluticasone-salmeterol (WIXELA INHUB) 250-50 MCG/ACT AEPB, Inhale 1 puff into the lungs in the morning and at bedtime., Disp: 60 each, Rfl: 12   gabapentin (NEURONTIN) 300 MG capsule, TAKE 1 CAPSULE(300 MG) BY MOUTH TWICE DAILY, Disp: 60 capsule, Rfl: 2   gabapentin (NEURONTIN) 600 MG tablet, TAKE 1 TABLET(600 MG) BY MOUTH TWICE  DAILY, Disp: 60 tablet, Rfl: 3   glucose blood (ACCU-CHEK GUIDE) test strip, Use as instructed once daily Dx R73.01, Disp: 100 each, Rfl: 12   ibuprofen (ADVIL) 800 MG tablet, Take 1 tablet (800 mg total) by mouth every 8 (eight) hours as needed for moderate pain., Disp: 60 tablet, Rfl: 1   Ipratropium-Albuterol (COMBIVENT RESPIMAT) 20-100 MCG/ACT AERS respimat, INHALE 1 PUFF BY MOUTH EVERY 6 HOURS, Disp: 4 g, Rfl: 3    ipratropium-albuterol (DUONEB) 0.5-2.5 (3) MG/3ML SOLN, Take 3 mLs by nebulization 4 (four) times daily., Disp: 360 mL, Rfl: 11   lidocaine (XYLOCAINE) 2 % solution, Use as directed 10 mLs in the mouth or throat every 6 (six) hours as needed for mouth pain., Disp: 400 mL, Rfl: 1   methocarbamol (ROBAXIN) 750 MG tablet, Take 1 tablet (750 mg total) by mouth every 6 (six) hours as needed for muscle spasms., Disp: 60 tablet, Rfl: 1   montelukast (SINGULAIR) 10 MG tablet, Take 1 tablet (10 mg total) by mouth at bedtime., Disp: 90 tablet, Rfl: 1   nystatin (MYCOSTATIN) 100000 UNIT/ML suspension, Take 5 mLs (500,000 Units total) by mouth 4 (four) times daily., Disp: 120 mL, Rfl: 0   ondansetron (ZOFRAN) 4 MG tablet, TAKE 1 TABLET BY MOUTH TWICE DAILY AS NEEDED, Disp: 45 tablet, Rfl: 1   pantoprazole (PROTONIX) 40 MG tablet, Take 40 mg by mouth 2 (two) times daily., Disp: , Rfl:    pilocarpine (SALAGEN) 5 MG tablet, Take 5 mg by mouth 2 (two) times daily., Disp: , Rfl:    predniSONE (DELTASONE) 10 MG tablet, Take 1 tablet (10 mg total) by mouth daily. 6,5,4,3,2,1 six day taper, Disp: 21 tablet, Rfl: 0   rOPINIRole (REQUIP) 0.5 MG tablet, TAKE 2 TABLETS BY MOUTH TWICE DAILY FOR RESTLESS LEGS, Disp: 360 tablet, Rfl: 1   sennosides-docusate sodium (SENOKOT-S) 8.6-50 MG tablet, Take 2 tablets by mouth daily as needed for constipation. Home med, Disp: , Rfl:    SUMAtriptan (IMITREX) 100 MG tablet, Take 1 tablet (100 mg total) by mouth every 2 (two) hours as needed (for migraine headaches.). May repeat in 1 hours if headache persists or recurs., Disp: 10 tablet, Rfl: 3   tirzepatide (MOUNJARO) 2.5 MG/0.5ML Pen, Inject 2.5 mg into the skin once a week., Disp: 6 mL, Rfl: 2   traZODone (DESYREL) 150 MG tablet, TAKE 1 TABLET(150 MG) BY MOUTH AT BEDTIME, Disp: 30 tablet, Rfl: 2   Subjective:   PATIENT ID: Summer Murphy GENDER: female DOB: January 03, 1956, MRN: [id number]  Chief Complaint  Patient presents with    Follow-up    Occasional shortness of breath on exertion.     HPI  Patient is a pleasant 67 year old female with a past medical history of poorly controlled asthma presenting for follow up. Patient has transitioned her care over from Dr. Thurston Hole given he is no longer going to be in our [place] office   Patient had established care in our clinic with Dr. Sherene Sires for asthma. She's had a long standing history of asthma, with previous PFT's showing obstruction (scooping on flow volume loop and FEV1 of 60% predicted - normal lung volumes, normal DLCO) and was maintained on twice daily nebulized budesonide. She's also using combivent and albuterol nebulizers as needed. Since her last visit with me, we switched her to high dose advair (wixela) twice daily.  She feels much better on follow up today. Symptoms are overall much improved and nearly resolved. She's also used her flonase  regularly. She's not had any exacerbations since her last visit. She has not had to use prednisone. She is compliant with her medications and inhalers. No fevers, chills, night sweats, or weight loss reported  She was previously seen by surgery for her hiatal hernia and saw Dr. Everlene Farrier locally and was seen at [facility]. Given she's had two surgeries for her hiatal hernia, further interventions were deferred.  Ancillary information including prior medications, full medical/surgical/family/social histories, and PFTs (when available) are listed below and have been reviewed.   Review of Systems  Constitutional:  Negative for chills, fever and weight loss.  Respiratory:  Negative for cough, sputum production, shortness of breath and wheezing.      Objective:   Vitals:   12/21/22 1433  BP: 114/60  Pulse: 100  Temp: 97.6 F (36.4 C)  TempSrc: Temporal  SpO2: 96%  Weight: 143 lb (64.9 kg)  Height: 5\' 3"  (1.6 m)   96% on RA  BMI Readings from Last 3 Encounters:  12/21/22 25.33 kg/m  12/10/22 25.86 kg/m  11/27/22 28.12 kg/m    Wt Readings from Last 3 Encounters:  12/21/22 143 lb (64.9 kg)  12/10/22 146 lb (66.2 kg)  11/27/22 144 lb (65.3 kg)    Physical Exam Constitutional:      Appearance: Normal appearance. She is obese. She is not ill-appearing.  Pulmonary:     Effort: Pulmonary effort is normal.     Breath sounds: No wheezing, rhonchi or rales.  Abdominal:     Palpations: Abdomen is soft.  Neurological:     General: No focal deficit present.     Mental Status: She is alert and oriented to person, place, and time. Mental status is at baseline.       Ancillary Information    Past Medical History:  Diagnosis Date   Acid reflux    Anemia    Anxiety    Arrhythmia    treated with meds and has no current problems   Arthritis    most uncomfortable in knees   Asthma    uses several inhalers   Depression    Fever blister    Hematuria    History of kidney stones    Hypertension    Hypoglycemia    Left flank pain    Migraine    Pre-diabetes    Restless leg    Yeast vaginitis      Family History  Problem Relation Age of Onset   Hypertension Mother    Stroke Mother    Prostate cancer Father    Kidney Stones Father    Diabetes Brother    Hypertension Brother    Breast cancer Maternal Aunt    Kidney disease Neg Hx      Past Surgical History:  Procedure Laterality Date   ABDOMINAL HYSTERECTOMY     AMPUTATION TOE Left 07/31/2016   Procedure: AMPUTATION TOE/MPJ 2nd toe;  Surgeon: Linus Galas, DPM;  Location: [facility] ORS;  Service: Podiatry;  Laterality: Left;   APPENDECTOMY  1990   BACK SURGERY     low back   BREAST SURGERY     bilateral breast reduction   CARDIAC ELECTROPHYSIOLOGY STUDY AND ABLATION     CHOLECYSTECTOMY  1990   COLONOSCOPY WITH PROPOFOL N/A 01/04/2017   Procedure: COLONOSCOPY WITH PROPOFOL;  Surgeon: Scot Jun, MD;  Location: [facility] ENDOSCOPY;  Service: Endoscopy;  Laterality: N/A;   CORNEAL TRANSPLANT     ESOPHAGOGASTRODUODENOSCOPY N/A 04/09/2021    Procedure: ESOPHAGOGASTRODUODENOSCOPY (EGD);  Surgeon: Toney Reil, MD;  Location: [facility] ENDOSCOPY;  Service: Gastroenterology;  Laterality: N/A;   ESOPHAGOGASTRODUODENOSCOPY (EGD) WITH PROPOFOL N/A 01/04/2017   Procedure: ESOPHAGOGASTRODUODENOSCOPY (EGD) WITH PROPOFOL;  Surgeon: Scot Jun, MD;  Location: [facility] ENDOSCOPY;  Service: Endoscopy;  Laterality: N/A;   EXCISION BONE CYST Left 07/31/2016   Procedure: EXCISION BONE CYST/exostectomy 28124/left 2nd;  Surgeon: Linus Galas, DPM;  Location: [facility] ORS;  Service: Podiatry;  Laterality: Left;   EXTRACORPOREAL SHOCK WAVE LITHOTRIPSY Left 09/12/2015   Procedure: EXTRACORPOREAL SHOCK WAVE LITHOTRIPSY (ESWL);  Surgeon: Vanna Scotland, MD;  Location: [facility] ORS;  Service: Urology;  Laterality: Left;   FRACTURE SURGERY     left foot   HARDWARE REMOVAL Left 11/14/2020   Procedure: HARDWARE REMOVAL;  Surgeon: Kennedy Bucker, MD;  Location: [facility] ORS;  Service: Orthopedics;  Laterality: Left;   HH repair     Fundoplication   JOINT REPLACEMENT Bilateral 2013,2014   total knees   LAPAROSCOPIC HYSTERECTOMY     LITHOTRIPSY     periprosthetic supracondylar fracture of left femur  02/16/2020   [facility]   TONSILLECTOMY     TOTAL KNEE REVISION Right 11/14/2019   Procedure: Revision patella and tibial polyethylene;  Surgeon: Kennedy Bucker, MD;  Location: [facility] ORS;  Service: Orthopedics;  Laterality: Right;   URETEROSCOPY      Social History   Socioeconomic History   Marital status: Widowed    Spouse name: Not on file   Number of children: Not on file   Years of education: Not on file   Highest education level: Not on file  Occupational History   Not on file  Tobacco Use   Smoking status: Never    Passive exposure: Past   Smokeless tobacco: Never  Vaping Use   Vaping status: Never Used  Substance and Sexual Activity   Alcohol use: No   Drug use: No   Sexual activity: Not on file  Other Topics Concern   Not on file  Social  History Narrative   Not on file   Social Determinants of Health   Financial Resource Strain: Not on file  Food Insecurity: No Food Insecurity (06/02/2022)   Hunger Vital Sign    Worried About Running Out of Food in the Last Year: Never true    Ran Out of Food in the Last Year: Never true  Transportation Needs: No Transportation Needs (06/02/2022)   PRAPARE - Administrator, Civil Service (Medical): No    Lack of Transportation (Non-Medical): No  Physical Activity: Not on file  Stress: Not on file  Social Connections: Not on file  Intimate Partner Violence: Not At Risk (06/02/2022)   Humiliation, Afraid, Rape, and Kick questionnaire    Fear of Current or Ex-Partner: No    Emotionally Abused: No    Physically Abused: No    Sexually Abused: No     Allergies  Allergen Reactions   Librium [Chlordiazepoxide] Shortness Of Breath   Metoclopramide Hives and Other (See Comments)    hallucinations    Vanilla Shortness Of Breath    Pt reports allergy to vanilla extract only   Aspirin Hives   Nsaids Rash    Rash/flares asthma issues.   Azithromycin Other (See Comments)    Unsure of what the reaction was.   Buprenorphine Hcl Other (See Comments)    Unsure of what the reaction was.    Chlordiazepoxide Hcl Other (See Comments)    unsure   Morphine Other (See Comments)  Unsure of reaction   Sulfa Antibiotics     Other reaction(s): Unknown   Tolmetin     Other Reaction: Allergy   Amlodipine Besylate Itching and Rash    arms, stomach and forehead   Iron Nausea And Vomiting    Other reaction(s): Unknown     CBC    Component Value Date/Time   WBC 7.3 09/30/2022 1159   RBC 4.08 09/30/2022 1159   HGB 11.2 (L) 09/30/2022 1159   HGB 13.4 08/29/2021 1303   HCT 34.5 (L) 09/30/2022 1159   HCT 39.4 08/29/2021 1303   PLT 272 09/30/2022 1159   PLT 186 08/29/2021 1303   MCV 84.6 09/30/2022 1159   MCV 91 08/29/2021 1303   MCV 84 06/28/2014 1710   MCH 27.5 09/30/2022 1159    MCHC 32.5 09/30/2022 1159   RDW 15.3 09/30/2022 1159   RDW 12.6 08/29/2021 1303   RDW 16.7 (H) 06/28/2014 1710   LYMPHSABS 1.6 09/30/2022 1159   LYMPHSABS 2.0 08/29/2021 1303   LYMPHSABS 0.6 (L) 06/13/2013 0505   MONOABS 0.4 09/30/2022 1159   MONOABS 0.1 (L) 06/13/2013 0505   EOSABS 0.3 09/30/2022 1159   EOSABS 0.2 08/29/2021 1303   EOSABS 0.0 06/13/2013 0505   BASOSABS 0.0 09/30/2022 1159   BASOSABS 0.0 08/29/2021 1303   BASOSABS 0.0 06/13/2013 0505    Pulmonary Functions Testing Results:     No data to display          Outpatient Medications Prior to Visit  Medication Sig Dispense Refill   Accu-Chek Softclix Lancets lancets Use as instructed once a daily DX R730.01 100 each 12   alendronate (FOSAMAX) 70 MG tablet Take 1 tablet (70 mg total) by mouth once a week. Take with a full glass of water on an empty stomach. 12 tablet 3   Blood Glucose Monitoring Suppl (ACCU-CHEK GUIDE ME) w/Device KIT Use as directed once a day DX R730.01 1 kit 0   busPIRone (BUSPAR) 10 MG tablet Take 1 tablet (10 mg total) by mouth 2 (two) times daily. 60 tablet 2   clonazePAM (KLONOPIN) 1 MG tablet Take 0.5 tablets (0.5 mg total) by mouth 2 (two) times daily as needed for anxiety. 30 tablet 2   diclofenac Sodium (VOLTAREN) 1 % GEL Apply 4 g topically 4 (four) times daily. 350 g 1   diltiazem (CARDIZEM CD) 240 MG 24 hr capsule TAKE ONE CAPSULE BY MOUTH DAILY 90 capsule 3   DULoxetine (CYMBALTA) 60 MG capsule Take 1 capsule (60 mg total) by mouth at bedtime. 90 capsule 1   EPINEPHrine 0.3 mg/0.3 mL IJ SOAJ injection Inject 0.3 mg into the muscle as needed for anaphylaxis. 1 each 3   fluticasone (FLONASE) 50 MCG/ACT nasal spray Place 1 spray into both nostrils daily. 18.2 mL 2   gabapentin (NEURONTIN) 300 MG capsule TAKE 1 CAPSULE(300 MG) BY MOUTH TWICE DAILY 60 capsule 2   gabapentin (NEURONTIN) 600 MG tablet TAKE 1 TABLET(600 MG) BY MOUTH TWICE DAILY 60 tablet 3   glucose blood (ACCU-CHEK GUIDE)  test strip Use as instructed once daily Dx R73.01 100 each 12   ibuprofen (ADVIL) 800 MG tablet Take 1 tablet (800 mg total) by mouth every 8 (eight) hours as needed for moderate pain. 60 tablet 1   Ipratropium-Albuterol (COMBIVENT RESPIMAT) 20-100 MCG/ACT AERS respimat INHALE 1 PUFF BY MOUTH EVERY 6 HOURS 4 g 3   ipratropium-albuterol (DUONEB) 0.5-2.5 (3) MG/3ML SOLN Take 3 mLs by nebulization 4 (four) times daily. 360  mL 11   lidocaine (XYLOCAINE) 2 % solution Use as directed 10 mLs in the mouth or throat every 6 (six) hours as needed for mouth pain. 400 mL 1   methocarbamol (ROBAXIN) 750 MG tablet Take 1 tablet (750 mg total) by mouth every 6 (six) hours as needed for muscle spasms. 60 tablet 1   montelukast (SINGULAIR) 10 MG tablet Take 1 tablet (10 mg total) by mouth at bedtime. 90 tablet 1   nystatin (MYCOSTATIN) 100000 UNIT/ML suspension Take 5 mLs (500,000 Units total) by mouth 4 (four) times daily. 120 mL 0   ondansetron (ZOFRAN) 4 MG tablet TAKE 1 TABLET BY MOUTH TWICE DAILY AS NEEDED 45 tablet 1   pantoprazole (PROTONIX) 40 MG tablet Take 40 mg by mouth 2 (two) times daily.     pilocarpine (SALAGEN) 5 MG tablet Take 5 mg by mouth 2 (two) times daily.     predniSONE (DELTASONE) 10 MG tablet Take 1 tablet (10 mg total) by mouth daily. 6,5,4,3,2,1 six day taper 21 tablet 0   rOPINIRole (REQUIP) 0.5 MG tablet TAKE 2 TABLETS BY MOUTH TWICE DAILY FOR RESTLESS LEGS 360 tablet 1   sennosides-docusate sodium (SENOKOT-S) 8.6-50 MG tablet Take 2 tablets by mouth daily as needed for constipation. Home med     SUMAtriptan (IMITREX) 100 MG tablet Take 1 tablet (100 mg total) by mouth every 2 (two) hours as needed (for migraine headaches.). May repeat in 1 hours if headache persists or recurs. 10 tablet 3   tirzepatide (MOUNJARO) 2.5 MG/0.5ML Pen Inject 2.5 mg into the skin once a week. 6 mL 2   traZODone (DESYREL) 150 MG tablet TAKE 1 TABLET(150 MG) BY MOUTH AT BEDTIME 30 tablet 2    fluticasone-salmeterol (ADVAIR DISKUS) 500-50 MCG/ACT AEPB Inhale 1 puff into the lungs in the morning and at bedtime. 60 each 12   oxyCODONE (ROXICODONE) 5 MG immediate release tablet Take 1 tablet (5 mg total) by mouth every 6 (six) hours as needed. 15 tablet 0   No facility-administered medications prior to visit.

## 2022-12-23 ENCOUNTER — Telehealth: Payer: Self-pay | Admitting: Student in an Organized Health Care Education/Training Program

## 2022-12-23 ENCOUNTER — Ambulatory Visit
Payer: 59 | Attending: Student in an Organized Health Care Education/Training Program | Admitting: Student in an Organized Health Care Education/Training Program

## 2022-12-23 ENCOUNTER — Encounter: Payer: Self-pay | Admitting: Student in an Organized Health Care Education/Training Program

## 2022-12-23 ENCOUNTER — Ambulatory Visit
Admission: RE | Admit: 2022-12-23 | Discharge: 2022-12-23 | Disposition: A | Discharge status: Home / Self Care | Payer: 59 | Source: Ambulatory Visit | Attending: Student in an Organized Health Care Education/Training Program | Admitting: Student in an Organized Health Care Education/Training Program

## 2022-12-23 DIAGNOSIS — M5412 Radiculopathy, cervical region: Secondary | ICD-10-CM | POA: Insufficient documentation

## 2022-12-23 DIAGNOSIS — M4802 Spinal stenosis, cervical region: Secondary | ICD-10-CM | POA: Insufficient documentation

## 2022-12-23 MED ORDER — ROPIVACAINE HCL 2 MG/ML IJ SOLN
1.0000 mL | Freq: Once | INTRAMUSCULAR | Status: AC
  Administered 2022-12-23: 1 mL via EPIDURAL
  Filled 2022-12-23: qty 20

## 2022-12-23 MED ORDER — DIAZEPAM 5 MG PO TABS
5.0000 mg | ORAL_TABLET | ORAL | Status: AC
  Administered 2022-12-23: 5 mg via ORAL

## 2022-12-23 MED ORDER — SODIUM CHLORIDE 0.9% FLUSH
1.0000 mL | Freq: Once | INTRAVENOUS | Status: AC
  Administered 2022-12-23: 1 mL

## 2022-12-23 MED ORDER — IOHEXOL 180 MG/ML  SOLN
10.0000 mL | Freq: Once | INTRAMUSCULAR | Status: AC
  Administered 2022-12-23: 10 mL via EPIDURAL
  Filled 2022-12-23: qty 20

## 2022-12-23 MED ORDER — MIDAZOLAM HCL 2 MG/2ML IJ SOLN: 0.5000 mg | Freq: Once | INTRAMUSCULAR | Status: DC

## 2022-12-23 MED ORDER — MELOXICAM 7.5 MG PO TABS
7.5000 mg | ORAL_TABLET | Freq: Two times a day (BID) | ORAL | 0 refills | Status: AC | PRN
Start: 2022-12-23 — End: 2023-01-07

## 2022-12-23 MED ORDER — DIAZEPAM 5 MG PO TABS
ORAL_TABLET | ORAL | Status: AC
  Filled 2022-12-23: qty 1

## 2022-12-23 MED ORDER — LIDOCAINE HCL 2 % IJ SOLN
20.0000 mL | Freq: Once | INTRAMUSCULAR | Status: AC
  Administered 2022-12-23: 100 mg
  Filled 2022-12-23: qty 40

## 2022-12-23 MED ORDER — DEXAMETHASONE SODIUM PHOSPHATE 10 MG/ML IJ SOLN
10.0000 mg | Freq: Once | INTRAMUSCULAR | Status: AC
  Administered 2022-12-23: 10 mg
  Filled 2022-12-23: qty 1

## 2022-12-23 MED ORDER — LACTATED RINGERS IV SOLN: Freq: Once | INTRAVENOUS | Status: DC

## 2022-12-23 NOTE — Patient Instructions (Signed)
Pain Management Discharge Instructions  General Discharge Instructions :  If you need to reach your doctor call: Monday-Friday 8:00 am - 4:00 pm at [phone] or toll free [phone].  After clinic hours [phone] to have operator reach doctor.  Bring all of your medication bottles to all your appointments in the pain clinic.  To cancel or reschedule your appointment with Pain Management please remember to call 24 hours in advance to avoid a fee.  Refer to the educational materials which you have been given on: General Risks, I had my Procedure. Discharge Instructions, Post Sedation.  Post Procedure Instructions:  The drugs you were given will stay in your system until tomorrow, so for the next 24 hours you should not drive, make any legal decisions or drink any alcoholic beverages.  You may eat anything you prefer, but it is better to start with liquids then soups and crackers, and gradually work up to solid foods.  Please notify your doctor immediately if you have any unusual bleeding, trouble breathing or pain that is not related to your normal pain.  Depending on the type of procedure that was done, some parts of your body may feel week and/or numb.  This usually clears up by tonight or the next day.  Walk with the use of an assistive device or accompanied by an adult for the 24 hours.  You may use ice on the affected area for the first 24 hours.  Put ice in a Ziploc bag and cover with a towel and place against area 15 minutes on 15 minutes off.  You may switch to heat after 24 hours.Epidural Steroid Injection Patient Information  Description: The epidural space surrounds the nerves as they exit the spinal cord.  In some patients, the nerves can be compressed and inflamed by a bulging disc or a tight spinal canal (spinal stenosis).  By injecting steroids into the epidural space, we can bring irritated nerves into direct contact with a potentially helpful medication.  These  steroids act directly on the irritated nerves and can reduce swelling and inflammation which often leads to decreased pain.  Epidural steroids may be injected anywhere along the spine and from the neck to the low back depending upon the location of your pain.   After numbing the skin with local anesthetic (like Novocaine), a small needle is passed into the epidural space slowly.  You may experience a sensation of pressure while this is being done.  The entire block usually last less than 10 minutes.  Conditions which may be treated by epidural steroids:  Low back and leg pain Neck and arm pain Spinal stenosis Post-laminectomy syndrome Herpes zoster (shingles) pain Pain from compression fractures  Preparation for the injection:  Do not eat any solid food or dairy products within 8 hours of your appointment.  You may drink clear liquids up to 3 hours before appointment.  Clear liquids include water, black coffee, juice or soda.  No milk or cream please. You may take your regular medication, including pain medications, with a sip of water before your appointment  Diabetics should hold regular insulin (if taken separately) and take 1/2 normal NPH dos the morning of the procedure.  Carry some sugar containing items with you to your appointment. A driver must accompany you and be prepared to drive you home after your procedure.  Bring all your current medications with your. An IV may be inserted and sedation may be given at the discretion of the physician.     A blood pressure cuff, EKG and other monitors will often be applied during the procedure.  Some patients may need to have extra oxygen administered for a short period. You will be asked to provide medical information, including your allergies, prior to the procedure.  We must know immediately if you are taking blood thinners (like Coumadin/Warfarin)  Or if you are allergic to IV iodine contrast (dye). We must know if you could possible be  pregnant.  Possible side-effects: Bleeding from needle site Infection (rare, may require surgery) Nerve injury (rare) Numbness & tingling (temporary) Difficulty urinating (rare, temporary) Spinal headache ( a headache worse with upright posture) Light -headedness (temporary) Pain at injection site (several days) Decreased blood pressure (temporary) Weakness in arm/leg (temporary) Pressure sensation in back/neck (temporary)  Call if you experience: Fever/chills associated with headache or increased back/neck pain. Headache worsened by an upright position. New onset weakness or numbness of an extremity below the injection site Hives or difficulty breathing (go to the emergency room) Inflammation or drainage at the infection site Severe back/neck pain Any new symptoms which are concerning to you  Please note:  Although the local anesthetic injected can often make your back or neck feel good for several hours after the injection, the pain will likely return.  It takes 3-7 days for steroids to work in the epidural space.  You may not notice any pain relief for at least that one week.  If effective, we will often do a series of three injections spaced 3-6 weeks apart to maximally decrease your pain.  After the initial series, we generally will wait several months before considering a repeat injection of the same type.  If you have any questions, please call [phone] [facility] 

## 2022-12-23 NOTE — Telephone Encounter (Signed)
PT had procedure done today. PT states that she is at pharmacy and prescription hasn't been send in. PT states that she don't drive that's why she was trying to get pick up prescription now.

## 2022-12-23 NOTE — Progress Notes (Signed)
Safety precautions to be maintained throughout the outpatient stay will include: orient to surroundings, keep bed in low position, maintain call bell within reach at all times, provide assistance with transfer out of bed and ambulation.  

## 2022-12-23 NOTE — Telephone Encounter (Signed)
Call to patient and she states she has her prescription now. Issued was resolved.

## 2022-12-23 NOTE — Progress Notes (Signed)
PROVIDER NOTE: Interpretation of information contained herein should be left to medically-trained personnel. Specific patient instructions are provided elsewhere under "Patient Instructions" section of medical record. This document was created in part using STT-dictation technology, any transcriptional errors that may result from this process are unintentional.  Patient: Summer Murphy Type: Established DOB: 1955/09/29 MRN: [id number] PCP: [name], NP  Service: Procedure DOS: 12/23/2022 Setting: Ambulatory Location: Ambulatory outpatient facility Delivery: Face-to-face Provider: Edward Jolly, MD Specialty: Interventional Pain Management Specialty designation: 09 Location: Outpatient facility Ref. Prov.: Edward Jolly, MD       Interventional Therapy   Procedure: Cervical Epidural Steroid injection (CESI) (Interlaminar) #1  Laterality: Left  Level: C7-T1 Imaging: Fluoroscopy-assisted DOS: 12/23/2022  Performed by: Edward Jolly, MD Anesthesia: Local anesthesia (1-2% Lidocaine) Sedation: Minimal Sedation                         Purpose: Diagnostic/Therapeutic Indications: Cervicalgia, cervical radicular pain, degenerative disc disease, severe enough to impact quality of life or function. 1. Cervical radicular pain (left severe)   2. Foraminal stenosis of cervical region (severe b/l C5/6; C6/7)   3. Spinal stenosis in cervical region    NAS-11 score:   Pre-procedure: 8 /10   Post-procedure: 4 /10      Position  Prep  Materials:  Location setting: Procedure suite Position: Prone, on modified reverse trendelenburg to facilitate breathing, with head in head-cradle. Pillows positioned under chest (below chin-level) with cervical spine flexed. Safety Precautions: Patient was assessed for positional comfort and pressure points before starting the procedure. Prepping solution: DuraPrep (Iodine Povacrylex [0.7% available iodine] and Isopropyl Alcohol, 74% w/w) Prep Area:  Entire  cervicothoracic region Approach: percutaneous, paramedial Intended target: Posterior cervical epidural space Materials Procedure:  Tray: Epidural Needle(s): Epidural (Tuohy) Qty: 1 Length: (90mm) 3.5-inch Gauge: 17G   H&P (Pre-op Assessment):  Ms. Pecorella is a 67 y.o. (year old), female patient, seen today for interventional treatment. She  has a past surgical history that includes Ureteroscopy; Laparoscopic hysterectomy; Lithotripsy; HH repair; Cardiac electrophysiology study and ablation; Appendectomy (1990); Cholecystectomy (1990); Joint replacement (Bilateral, [id number]); Extracorporeal shock wave lithotripsy (Left, 09/12/2015); Tonsillectomy; Corneal transplant; Abdominal hysterectomy; Breast surgery; Fracture surgery; Amputation toe (Left, 07/31/2016); Excision bone cyst (Left, 07/31/2016); Colonoscopy with propofol (N/A, 01/04/2017); Esophagogastroduodenoscopy (egd) with propofol (N/A, 01/04/2017); Back surgery; Total knee revision (Right, 11/14/2019); periprosthetic supracondylar fracture of left femur (02/16/2020); Hardware Removal (Left, 11/14/2020); Esophagogastroduodenoscopy (N/A, 04/09/2021); and Gum surgery (Left). Ms. Ozga has a current medication list which includes the following prescription(s): accu-chek softclix lancets, alendronate, accu-chek guide me, buspirone, clonazepam, diclofenac sodium, diltiazem, duloxetine, epinephrine, fluticasone, fluticasone-salmeterol, gabapentin, gabapentin, accu-chek guide, combivent respimat, ipratropium-albuterol, lidocaine, meloxicam, methocarbamol, montelukast, nystatin, ondansetron, pantoprazole, pilocarpine, prednisone, ropinirole, sennosides-docusate sodium, sumatriptan, tirzepatide, and trazodone. Her primarily concern today is the Neck Pain  Initial Vital Signs:  Pulse/HCG Rate: 80ECG Heart Rate: 81 Temp: 98.1 F (36.7 C) Resp: 18 BP: 129/62 SpO2: 100 %  BMI: Estimated body mass index is 25.33 kg/m as calculated from the  following:   Height as of this encounter: 5\' 3"  (1.6 m).   Weight as of this encounter: 143 lb (64.9 kg).  Risk Assessment: Allergies: Reviewed. She is allergic to librium [chlordiazepoxide], metoclopramide, vanilla, aspirin, nsaids, azithromycin, buprenorphine hcl, chlordiazepoxide hcl, morphine, sulfa antibiotics, tolmetin, amlodipine besylate, and iron.  Allergy Precautions: None required Coagulopathies: Reviewed. None identified.  Blood-thinner therapy: None at this time Active Infection(s): Reviewed. None identified. Ms. Kohr is afebrile  Site Confirmation:  Ms. Wellard was asked to confirm the procedure and laterality before marking the site Procedure checklist: Completed Consent: Before the procedure and under the influence of no sedative(s), amnesic(s), or anxiolytics, the patient was informed of the treatment options, risks and possible complications. To fulfill our ethical and legal obligations, as recommended by the American Medical Association's Code of Ethics, I have informed the patient of my clinical impression; the nature and purpose of the treatment or procedure; the risks, benefits, and possible complications of the intervention; the alternatives, including doing nothing; the risk(s) and benefit(s) of the alternative treatment(s) or procedure(s); and the risk(s) and benefit(s) of doing nothing. The patient was provided information about the general risks and possible complications associated with the procedure. These may include, but are not limited to: failure to achieve desired goals, infection, bleeding, organ or nerve damage, allergic reactions, paralysis, and death. In addition, the patient was informed of those risks and complications associated to Spine-related procedures, such as failure to decrease pain; infection (i.e.: Meningitis, epidural or intraspinal abscess); bleeding (i.e.: epidural hematoma, subarachnoid hemorrhage, or any other type of intraspinal or peri-dural  bleeding); organ or nerve damage (i.e.: Any type of peripheral nerve, nerve root, or spinal cord injury) with subsequent damage to sensory, motor, and/or autonomic systems, resulting in permanent pain, numbness, and/or weakness of one or several areas of the body; allergic reactions; (i.e.: anaphylactic reaction); and/or death. Furthermore, the patient was informed of those risks and complications associated with the medications. These include, but are not limited to: allergic reactions (i.e.: anaphylactic or anaphylactoid reaction(s)); adrenal axis suppression; blood sugar elevation that in diabetics may result in ketoacidosis or comma; water retention that in patients with history of congestive heart failure may result in shortness of breath, pulmonary edema, and decompensation with resultant heart failure; weight gain; swelling or edema; medication-induced neural toxicity; particulate matter embolism and blood vessel occlusion with resultant organ, and/or nervous system infarction; and/or aseptic necrosis of one or more joints. Finally, the patient was informed that Medicine is not an exact science; therefore, there is also the possibility of unforeseen or unpredictable risks and/or possible complications that may result in a catastrophic outcome. The patient indicated having understood very clearly. We have given the patient no guarantees and we have made no promises. Enough time was given to the patient to ask questions, all of which were answered to the patient's satisfaction. Ms. Ratterman has indicated that she wanted to continue with the procedure. Attestation: I, the ordering provider, attest that I have discussed with the patient the benefits, risks, side-effects, alternatives, likelihood of achieving goals, and potential problems during recovery for the procedure that I have provided informed consent. Date  Time: 12/23/2022  9:27 AM   Pre-Procedure Preparation:  Monitoring: As per clinic protocol.  Respiration, ETCO2, SpO2, BP, heart rate and rhythm monitor placed and checked for adequate function Safety Precautions: Patient was assessed for positional comfort and pressure points before starting the procedure. Time-out: I initiated and conducted the "Time-out" before starting the procedure, as per protocol. The patient was asked to participate by confirming the accuracy of the "Time Out" information. Verification of the correct person, site, and procedure were performed and confirmed by me, the nursing staff, and the patient. "Time-out" conducted as per Joint Commission's Universal Protocol (UP.01.01.01). Time: 1025 Start Time: 1025 hrs.  Description  Narrative of Procedure:          Rationale (medical necessity): procedure needed and proper for the diagnosis and/or treatment of the  patient's medical symptoms and needs. Start Time: 1025 hrs. Safety Precautions: Aspiration looking for blood return was conducted prior to all injections. At no point did we inject any substances, as a needle was being advanced. No attempts were made at seeking any paresthesias. Safe injection practices and needle disposal techniques used. Medications properly checked for expiration dates. SDV (single dose vial) medications used. Description of procedure: Protocol guidelines were followed. The patient was assisted into a comfortable position. The target area was identified and the area prepped in the usual manner. Skin & deeper tissues infiltrated with local anesthetic. Appropriate amount of time allowed to pass for local anesthetics to take effect. Using fluoroscopic guidance, the epidural needle was introduced through the skin, ipsilateral to the reported pain, and advanced to the target area. Posterior laminar os was contacted and the needle walked caudad, until the lamina was cleared. The ligamentum flavum was engaged and the epidural space identified using "loss-of-resistance technique" with 2-3 ml of PF-NaCl (0.9%  NSS), in a 5cc dedicated LOR syringe. (See "Imaging guidance" below for use of contrast details.) Once proper needle placement was secured, and negative aspiration confirmed, the solution was injected in intermittent fashion, asking for systemic symptoms every 0.5cc. The needles were then removed and the area cleansed, making sure to leave some of the prepping solution back to take advantage of its long term bactericidal properties.  Vitals:   12/23/22 0931 12/23/22 1022 12/23/22 1027 12/23/22 1032  BP: 129/62 (!) 141/71 139/77 120/64  Pulse: 80     Resp:  18 16 18   Temp: 98.1 F (36.7 C)     SpO2: 100% 99% 99% 97%  Weight: 143 lb (64.9 kg)     Height: 5\' 3"  (1.6 m)        End Time: 1032 hrs.  Imaging Guidance (Spinal):          Type of Imaging Technique: Fluoroscopy Guidance (Spinal) Indication(s): Assistance in needle guidance and placement for procedures requiring needle placement in or near specific anatomical locations not easily accessible without such assistance. Exposure Time: Please see nurses notes. Contrast: Before injecting any contrast, we confirmed that the patient did not have an allergy to iodine, shellfish, or radiological contrast. Once satisfactory needle placement was completed at the desired level, radiological contrast was injected. Contrast injected under live fluoroscopy. No contrast complications. See chart for type and volume of contrast used. Fluoroscopic Guidance: I was personally present during the use of fluoroscopy. "Tunnel Vision Technique" used to obtain the best possible view of the target area. Parallax error corrected before commencing the procedure. "Direction-depth-direction" technique used to introduce the needle under continuous pulsed fluoroscopy. Once target was reached, antero-posterior, oblique, and lateral fluoroscopic projection used confirm needle placement in all planes. Images permanently stored in EMR. Interpretation: I personally interpreted  the imaging intraoperatively. Adequate needle placement confirmed in multiple planes. Appropriate spread of contrast into desired area was observed. No evidence of afferent or efferent intravascular uptake. No intrathecal or subarachnoid spread observed. Permanent images saved into the patient's record.  Post-operative Assessment:  Post-procedure Vital Signs:  Pulse/HCG Rate: 8081 Temp: 98.1 F (36.7 C) Resp: 18 BP: 120/64 SpO2: 97 %  EBL: None  Complications: No immediate post-treatment complications observed by team, or reported by patient.  Note: The patient tolerated the entire procedure well. A repeat set of vitals were taken after the procedure and the patient was kept under observation following institutional policy, for this type of procedure. Post-procedural neurological assessment was performed, showing  return to baseline, prior to discharge. The patient was provided with post-procedure discharge instructions, including a section on how to identify potential problems. Should any problems arise concerning this procedure, the patient was given instructions to immediately contact us, at any time, without hesitation. In any case, we plan to contact the patient by telephone for a follow-up status report regarding this interventional procedure.  Comments:  No additional relevant information.  Plan of Care (POC)  Orders:  Orders Placed This Encounter  Procedures   DG PAIN CLINIC C-ARM 1-60 MIN NO REPORT    Intraoperative interpretation by procedural physician at [facility].    Standing Status:   Standing    Number of Occurrences:   1    Order Specific Question:   Reason for exam:    Answer:   Assistance in needle guidance and placement for procedures requiring needle placement in or near specific anatomical locations not easily accessible without such assistance.     Medications ordered for procedure: Meds ordered this encounter  Medications   iohexol (OMNIPAQUE) 180  MG/ML injection 10 mL    Must be Myelogram-compatible. If not available, you may substitute with a water-soluble, non-ionic, hypoallergenic, myelogram-compatible radiological contrast medium.   lidocaine (XYLOCAINE) 2 % (with pres) injection 400 mg   DISCONTD: lactated ringers infusion   DISCONTD: midazolam (VERSED) injection 0.5-2 mg    Make sure Flumazenil is available in the pyxis when using this medication. If oversedation occurs, administer 0.2 mg IV over 15 sec. If after 45 sec no response, administer 0.2 mg again over 1 min; may repeat at 1 min intervals; not to exceed 4 doses (1 mg)   ropivacaine (PF) 2 mg/mL (0.2%) (NAROPIN) injection 1 mL   sodium chloride flush (NS) 0.9 % injection 1 mL   dexamethasone (DECADRON) injection 10 mg   diazepam (VALIUM) tablet 5 mg    Make sure Flumazenil is available in the pyxis when using this medication. If oversedation occurs, administer 0.2 mg IV over 15 sec. If after 45 sec no response, administer 0.2 mg again over 1 min; may repeat at 1 min intervals; not to exceed 4 doses (1 mg)   meloxicam (MOBIC) 7.5 MG tablet    Sig: Take 1 tablet (7.5 mg total) by mouth 2 (two) times daily as needed for up to 15 days for pain. Avoid other NSAIDs    Dispense:  30 tablet    Refill:  0  Discontinue Ibuprofen  Medications administered: We administered iohexol, lidocaine, ropivacaine (PF) 2 mg/mL (0.2%), sodium chloride flush, dexamethasone, and diazepam.  See the medical record for exact dosing, route, and time of administration.  Follow-up plan:   Return in about 4 weeks (around 01/20/2023) for PPE, F2F.      Recent Visits Date Type Provider Dept  12/10/22 Office Visit Edward Jolly, MD [facility]  Showing recent visits within past 90 days and meeting all other requirements Today's Visits Date Type Provider Dept  12/23/22 Procedure visit Edward Jolly, MD [facility]  Showing today's visits and meeting all other  requirements Future Appointments Date Type Provider Dept  01/25/23 Appointment Edward Jolly, MD [facility]  Showing future appointments within next 90 days and meeting all other requirements  Disposition: Discharge home  Discharge (Date  Time): 12/23/2022; 1040 hrs.   Primary Care Physician: [name], NP Location: [facility] Note by: Edward Jolly, MD (TTS technology used. I apologize for any typographical errors that were not  detected and corrected.) Date: 12/23/2022; Time: 11:35 AM  Disclaimer:  Medicine is not an Visual merchandiser. The only guarantee in medicine is that nothing is guaranteed. It is important to note that the decision to proceed with this intervention was based on the information collected from the patient. The Data and conclusions were drawn from the patient's questionnaire, the interview, and the physical examination. Because the information was provided in large part by the patient, it cannot be guaranteed that it has not been purposely or unconsciously manipulated. Every effort has been made to obtain as much relevant data as possible for this evaluation. It is important to note that the conclusions that lead to this procedure are derived in large part from the available data. Always take into account that the treatment will also be dependent on availability of resources and existing treatment guidelines, considered by other Pain Management Practitioners as being common knowledge and practice, at the time of the intervention. For Medico-Legal purposes, it is also important to point out that variation in procedural techniques and pharmacological choices are the acceptable norm. The indications, contraindications, technique, and results of the above procedure should only be interpreted and judged by a Board-Certified Interventional Pain Specialist with extensive familiarity and expertise in the same exact procedure and technique.

## 2022-12-24 ENCOUNTER — Telehealth: Payer: Self-pay | Admitting: *Deleted

## 2022-12-24 ENCOUNTER — Telehealth: Payer: Self-pay | Admitting: Student in an Organized Health Care Education/Training Program

## 2022-12-24 NOTE — Telephone Encounter (Signed)
Attempted to call for post procedure follow-up. No answer, voice mailmox not set up.

## 2022-12-24 NOTE — Telephone Encounter (Signed)
FYI. PT called stated that she is doing okay, little pain and her pain score is a 5.

## 2022-12-29 ENCOUNTER — Ambulatory Visit: Payer: 59 | Attending: Student in an Organized Health Care Education/Training Program | Admitting: Physical Therapy

## 2022-12-29 DIAGNOSIS — M4802 Spinal stenosis, cervical region: Secondary | ICD-10-CM | POA: Insufficient documentation

## 2022-12-29 DIAGNOSIS — M67912 Unspecified disorder of synovium and tendon, left shoulder: Secondary | ICD-10-CM | POA: Insufficient documentation

## 2022-12-29 DIAGNOSIS — M5412 Radiculopathy, cervical region: Secondary | ICD-10-CM | POA: Diagnosis not present

## 2022-12-29 DIAGNOSIS — M542 Cervicalgia: Secondary | ICD-10-CM | POA: Insufficient documentation
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
## 2022-12-30 ENCOUNTER — Ambulatory Visit (INDEPENDENT_AMBULATORY_CARE_PROVIDER_SITE_OTHER): Payer: Self-pay | Admitting: Nurse Practitioner

## 2022-12-30 ENCOUNTER — Encounter: Payer: Self-pay | Admitting: Nurse Practitioner

## 2022-12-30 ENCOUNTER — Other Ambulatory Visit: Payer: Self-pay | Admitting: Nurse Practitioner

## 2022-12-30 VITALS — BP 128/68 | HR 100 | Temp 98.7°F | Resp 16 | Ht 63.0 in | Wt 145.8 lb

## 2022-12-30 DIAGNOSIS — F418 Other specified anxiety disorders: Secondary | ICD-10-CM

## 2022-12-30 DIAGNOSIS — E785 Hyperlipidemia, unspecified: Secondary | ICD-10-CM | POA: Diagnosis not present

## 2022-12-30 DIAGNOSIS — Z23 Encounter for immunization: Secondary | ICD-10-CM

## 2022-12-30 DIAGNOSIS — E1169 Type 2 diabetes mellitus with other specified complication: Secondary | ICD-10-CM

## 2022-12-30 DIAGNOSIS — E782 Mixed hyperlipidemia: Secondary | ICD-10-CM

## 2022-12-30 DIAGNOSIS — E119 Type 2 diabetes mellitus without complications: Secondary | ICD-10-CM

## 2022-12-30 DIAGNOSIS — Z79899 Other long term (current) drug therapy: Secondary | ICD-10-CM | POA: Diagnosis not present

## 2022-12-30 DIAGNOSIS — F3342 Major depressive disorder, recurrent, in full remission: Secondary | ICD-10-CM

## 2022-12-30 DIAGNOSIS — E1165 Type 2 diabetes mellitus with hyperglycemia: Secondary | ICD-10-CM

## 2022-12-30 LAB — POCT GLYCOSYLATED HEMOGLOBIN (HGB A1C): Hemoglobin A1C: 6.4 % — AB (ref 4.0–5.6)

## 2022-12-30 MED ORDER — TRAZODONE HCL 150 MG PO TABS
ORAL_TABLET | ORAL | 2 refills | Status: DC
Start: 2022-12-30 — End: 2023-04-15

## 2022-12-30 MED ORDER — CLONAZEPAM 1 MG PO TABS: 0.5000 mg | ORAL_TABLET | Freq: Two times a day (BID) | ORAL | 2 refills | Status: DC | PRN

## 2022-12-30 MED ORDER — BUSPIRONE HCL 10 MG PO TABS
10.0000 mg | ORAL_TABLET | Freq: Two times a day (BID) | ORAL | 2 refills | Status: DC
Start: 2022-12-30 — End: 2023-03-31

## 2022-12-30 NOTE — Progress Notes (Signed)
[facility] [address] [place], [place] [address]  Internal MEDICINE  Office Visit Note  Patient Name: Summer Murphy  [id number]  [id number]  Date of Service: 12/30/2022  Chief Complaint  Patient presents with   Depression   Gastroesophageal Reflux   Hypertension   Follow-up    HPI Summer Murphy presents for a follow-up visit fordiabetes, cholesterol and flu vaccine.  Diabetes -- A1c 6.4, glucose levels are stable.  Recent epidural in neck for chronic pain Cholesterol -- has not been checked in a couple of years. Was normal in 2022.  Requesting flu vaccine    Current Medication: Outpatient Encounter Medications as of 12/30/2022  Medication Sig   Accu-Chek Softclix Lancets lancets Use as instructed once a daily DX R730.01   alendronate (FOSAMAX) 70 MG tablet Take 1 tablet (70 mg total) by mouth once a week. Take with a full glass of water on an empty stomach.   Blood Glucose Monitoring Suppl (ACCU-CHEK GUIDE ME) w/Device KIT Use as directed once a day DX R730.01   diclofenac Sodium (VOLTAREN) 1 % GEL Apply 4 g topically 4 (four) times daily.   diltiazem (CARDIZEM CD) 240 MG 24 hr capsule TAKE ONE CAPSULE BY MOUTH DAILY   DULoxetine (CYMBALTA) 60 MG capsule Take 1 capsule (60 mg total) by mouth at bedtime.   EPINEPHrine 0.3 mg/0.3 mL IJ SOAJ injection Inject 0.3 mg into the muscle as needed for anaphylaxis.   fluticasone (FLONASE) 50 MCG/ACT nasal spray Place 1 spray into both nostrils daily.   fluticasone-salmeterol (WIXELA INHUB) 250-50 MCG/ACT AEPB Inhale 1 puff into the lungs in the morning and at bedtime.   gabapentin (NEURONTIN) 300 MG capsule TAKE 1 CAPSULE(300 MG) BY MOUTH TWICE DAILY   gabapentin (NEURONTIN) 600 MG tablet TAKE 1 TABLET(600 MG) BY MOUTH TWICE DAILY   glucose blood (ACCU-CHEK GUIDE) test strip Use as instructed once daily Dx R73.01   Ipratropium-Albuterol (COMBIVENT RESPIMAT) 20-100 MCG/ACT AERS respimat INHALE 1 PUFF BY MOUTH EVERY 6 HOURS    ipratropium-albuterol (DUONEB) 0.5-2.5 (3) MG/3ML SOLN Take 3 mLs by nebulization 4 (four) times daily.   lidocaine (XYLOCAINE) 2 % solution Use as directed 10 mLs in the mouth or throat every 6 (six) hours as needed for mouth pain.   meloxicam (MOBIC) 7.5 MG tablet Take 1 tablet (7.5 mg total) by mouth 2 (two) times daily as needed for up to 15 days for pain. Avoid other NSAIDs   methocarbamol (ROBAXIN) 750 MG tablet Take 1 tablet (750 mg total) by mouth every 6 (six) hours as needed for muscle spasms.   montelukast (SINGULAIR) 10 MG tablet TAKE 1 TABLET(10 MG) BY MOUTH AT BEDTIME   nystatin (MYCOSTATIN) 100000 UNIT/ML suspension Take 5 mLs (500,000 Units total) by mouth 4 (four) times daily.   ondansetron (ZOFRAN) 4 MG tablet TAKE 1 TABLET BY MOUTH TWICE DAILY AS NEEDED   pantoprazole (PROTONIX) 40 MG tablet Take 40 mg by mouth 2 (two) times daily.   pilocarpine (SALAGEN) 5 MG tablet Take 5 mg by mouth 2 (two) times daily.   rOPINIRole (REQUIP) 0.5 MG tablet TAKE 2 TABLETS BY MOUTH TWICE DAILY FOR RESTLESS LEGS   SUMAtriptan (IMITREX) 100 MG tablet Take 1 tablet (100 mg total) by mouth every 2 (two) hours as needed (for migraine headaches.). May repeat in 1 hours if headache persists or recurs.   tirzepatide [facility]) 2.5 MG/0.5ML Pen Inject 2.5 mg into the skin once a week.   [DISCONTINUED] busPIRone (BUSPAR) 10 MG tablet  Take 1 tablet (10 mg total) by mouth 2 (two) times daily.   [DISCONTINUED] clonazePAM (KLONOPIN) 1 MG tablet Take 0.5 tablets (0.5 mg total) by mouth 2 (two) times daily as needed for anxiety.   [DISCONTINUED] predniSONE (DELTASONE) 10 MG tablet Take 1 tablet (10 mg total) by mouth daily. 6,5,4,3,2,1 six day taper   [DISCONTINUED] sennosides-docusate sodium (SENOKOT-S) 8.6-50 MG tablet Take 2 tablets by mouth daily as needed for constipation. Home med   [DISCONTINUED] traZODone (DESYREL) 150 MG tablet TAKE 1 TABLET(150 MG) BY MOUTH AT BEDTIME   busPIRone (BUSPAR) 10 MG tablet  Take 1 tablet (10 mg total) by mouth 2 (two) times daily.   clonazePAM (KLONOPIN) 1 MG tablet Take 0.5 tablets (0.5 mg total) by mouth 2 (two) times daily as needed for anxiety.   traZODone (DESYREL) 150 MG tablet TAKE 1 TABLET(150 MG) BY MOUTH AT BEDTIME   No facility-administered encounter medications on file as of 12/30/2022.    Surgical History: Past Surgical History:  Procedure Laterality Date   ABDOMINAL HYSTERECTOMY     AMPUTATION TOE Left 07/31/2016   Procedure: AMPUTATION TOE/MPJ 2nd toe;  Surgeon: Linus Galas, DPM;  Location: [facility] ORS;  Service: Podiatry;  Laterality: Left;   APPENDECTOMY  1990   BACK SURGERY     low back   BREAST SURGERY     bilateral breast reduction   CARDIAC ELECTROPHYSIOLOGY STUDY AND ABLATION     CHOLECYSTECTOMY  1990   COLONOSCOPY WITH PROPOFOL N/A 01/04/2017   Procedure: COLONOSCOPY WITH PROPOFOL;  Surgeon: Scot Jun, MD;  Location: [facility] ENDOSCOPY;  Service: Endoscopy;  Laterality: N/A;   CORNEAL TRANSPLANT     ESOPHAGOGASTRODUODENOSCOPY N/A 04/09/2021   Procedure: ESOPHAGOGASTRODUODENOSCOPY (EGD);  Surgeon: Toney Reil, MD;  Location: [facility] ENDOSCOPY;  Service: Gastroenterology;  Laterality: N/A;   ESOPHAGOGASTRODUODENOSCOPY (EGD) WITH PROPOFOL N/A 01/04/2017   Procedure: ESOPHAGOGASTRODUODENOSCOPY (EGD) WITH PROPOFOL;  Surgeon: Scot Jun, MD;  Location: [facility] ENDOSCOPY;  Service: Endoscopy;  Laterality: N/A;   EXCISION BONE CYST Left 07/31/2016   Procedure: EXCISION BONE CYST/exostectomy 28124/left 2nd;  Surgeon: Linus Galas, DPM;  Location: [facility] ORS;  Service: Podiatry;  Laterality: Left;   EXTRACORPOREAL SHOCK WAVE LITHOTRIPSY Left 09/12/2015   Procedure: EXTRACORPOREAL SHOCK WAVE LITHOTRIPSY (ESWL);  Surgeon: Vanna Scotland, MD;  Location: [facility] ORS;  Service: Urology;  Laterality: Left;   FRACTURE SURGERY     left foot   GUM SURGERY Left    gum infection   HARDWARE REMOVAL Left 11/14/2020   Procedure: HARDWARE REMOVAL;   Surgeon: Kennedy Bucker, MD;  Location: [facility] ORS;  Service: Orthopedics;  Laterality: Left;   HH repair     Fundoplication   JOINT REPLACEMENT Bilateral 2013,2014   total knees   LAPAROSCOPIC HYSTERECTOMY     LITHOTRIPSY     periprosthetic supracondylar fracture of left femur  02/16/2020   [facility]   TONSILLECTOMY     TOTAL KNEE REVISION Right 11/14/2019   Procedure: Revision patella and tibial polyethylene;  Surgeon: Kennedy Bucker, MD;  Location: [facility] ORS;  Service: Orthopedics;  Laterality: Right;   URETEROSCOPY      Medical History: Past Medical History:  Diagnosis Date   Acid reflux    Anemia    Anxiety    Arrhythmia    treated with meds and has no current problems   Arthritis    most uncomfortable in knees   Asthma    uses several inhalers   Depression    Fever blister  Hematuria    History of kidney stones    Hypertension    Hypoglycemia    Left flank pain    Migraine    Pre-diabetes    Restless leg    Yeast vaginitis     Family History: Family History  Problem Relation Age of Onset   Hypertension Mother    Stroke Mother    Prostate cancer Father    Kidney Stones Father    Diabetes Brother    Hypertension Brother    Breast cancer Maternal Aunt    Kidney disease Neg Hx     Social History   Socioeconomic History   Marital status: Widowed    Spouse name: Not on file   Number of children: Not on file   Years of education: Not on file   Highest education level: Not on file  Occupational History   Not on file  Tobacco Use   Smoking status: Never    Passive exposure: Past   Smokeless tobacco: Never  Vaping Use   Vaping status: Never Used  Substance and Sexual Activity   Alcohol use: No   Drug use: No   Sexual activity: Not on file  Other Topics Concern   Not on file  Social History Narrative   Not on file   Social Determinants of Health   Financial Resource Strain: Not on file  Food Insecurity: No Food Insecurity (06/02/2022)    Hunger Vital Sign    Worried About Running Out of Food in the Last Year: Never true    Ran Out of Food in the Last Year: Never true  Transportation Needs: No Transportation Needs (06/02/2022)   PRAPARE - Administrator, Civil Service (Medical): No    Lack of Transportation (Non-Medical): No  Physical Activity: Not on file  Stress: Not on file  Social Connections: Not on file  Intimate Partner Violence: Not At Risk (06/02/2022)   Humiliation, Afraid, Rape, and Kick questionnaire    Fear of Current or Ex-Partner: No    Emotionally Abused: No    Physically Abused: No    Sexually Abused: No      Review of Systems  Constitutional:  Negative for fatigue and fever.  HENT:  Negative for congestion, mouth sores and postnasal drip.   Respiratory:  Negative for cough.   Cardiovascular:  Negative for chest pain.  Genitourinary:  Negative for flank pain.  Psychiatric/Behavioral: Negative.      Vital Signs: BP 128/68   Pulse 100   Temp 98.7 F (37.1 C)   Resp 16   Ht 5\' 3"  (1.6 m)   Wt 145 lb 12.8 oz (66.1 kg)   SpO2 95%   BMI 25.83 kg/m    Physical Exam Vitals reviewed.  Constitutional:      Appearance: Normal appearance.  HENT:     Head: Normocephalic and atraumatic.     Nose: Nose normal.     Mouth/Throat:     Mouth: Mucous membranes are moist.     Pharynx: No posterior oropharyngeal erythema.  Eyes:     Extraocular Movements: Extraocular movements intact.     Pupils: Pupils are equal, round, and reactive to light.  Cardiovascular:     Pulses: Normal pulses.     Heart sounds: Normal heart sounds.  Pulmonary:     Effort: Pulmonary effort is normal.     Breath sounds: Normal breath sounds.  Neurological:     General: No focal deficit present.  Mental Status: She is alert.  Psychiatric:        Mood and Affect: Mood normal.        Behavior: Behavior normal.        Assessment/Plan: 1. Type 2 diabetes mellitus with other specified complication,  without long-term current use of insulin (HCC) A1c is stable at 6.4, labs ordered for cholesterol - POCT glycosylated hemoglobin (Hb A1C) - Lipid Profile  2. Hyperlipidemia associated with type 2 diabetes mellitus (HCC) Cholesterol panel ordered  - Lipid Profile  3. Encounter for medication review Medication list reviewed, updated and refills ordered  - traZODone (DESYREL) 150 MG tablet; TAKE 1 TABLET(150 MG) BY MOUTH AT BEDTIME  Dispense: 30 tablet; Refill: 2 - busPIRone (BUSPAR) 10 MG tablet; Take 1 tablet (10 mg total) by mouth 2 (two) times daily.  Dispense: 60 tablet; Refill: 2  4. Needs flu shot Flu vaccine administered in office today - Influenza, MDCK, trivalent, PF(Flucelvax egg-free)   General Counseling: Rashauna verbalizes understanding of the findings of todays visit and agrees with plan of treatment. I have discussed any further diagnostic evaluation that may be needed or ordered today. We also reviewed her medications today. she has been encouraged to call the office with any questions or concerns that should arise related to todays visit.    Orders Placed This Encounter  Procedures   Influenza, MDCK, trivalent, PF(Flucelvax egg-free)   Lipid Profile   POCT glycosylated hemoglobin (Hb A1C)    Meds ordered this encounter  Medications   clonazePAM (KLONOPIN) 1 MG tablet    Sig: Take 0.5 tablets (0.5 mg total) by mouth 2 (two) times daily as needed for anxiety.    Dispense:  30 tablet    Refill:  2   traZODone (DESYREL) 150 MG tablet    Sig: TAKE 1 TABLET(150 MG) BY MOUTH AT BEDTIME    Dispense:  30 tablet    Refill:  2   busPIRone (BUSPAR) 10 MG tablet    Sig: Take 1 tablet (10 mg total) by mouth 2 (two) times daily.    Dispense:  60 tablet    Refill:  2    For future refills    Return in about 3 months (around 03/25/2023) for F/U, anxiety med refill, Shafin Pollio PCP.   Total time spent:30 Minutes Time spent includes review of chart, medications, test results,  and follow up plan with the patient.   [place] Controlled Substance Database was reviewed by me.  This patient was seen by [name], FNP-C in collaboration with Dr. Beverely Risen as a part of collaborative care agreement.   Cornelious Bartolucci R. Tedd Sias, MSN, FNP-C Internal medicine

## 2022-12-31 ENCOUNTER — Ambulatory Visit: Payer: 59 | Admitting: Physical Therapy

## 2022-12-31 DIAGNOSIS — M542 Cervicalgia: Secondary | ICD-10-CM | POA: Diagnosis not present

## 2022-12-31 DIAGNOSIS — M67912 Unspecified disorder of synovium and tendon, left shoulder: Secondary | ICD-10-CM
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
## 2023-01-05 ENCOUNTER — Ambulatory Visit: Payer: 59 | Admitting: Physical Therapy

## 2023-01-05 DIAGNOSIS — M67912 Unspecified disorder of synovium and tendon, left shoulder: Secondary | ICD-10-CM

## 2023-01-05 DIAGNOSIS — M542 Cervicalgia: Secondary | ICD-10-CM
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
## 2023-01-11 ENCOUNTER — Telehealth: Payer: Self-pay | Admitting: Student in an Organized Health Care Education/Training Program

## 2023-01-11 NOTE — Telephone Encounter (Signed)
Spoke with patient and she is having a lot of pain in her neck and going down left arm.  States she got a little bit 25% relief from the last Cervical Epidural.  She has an appointment on 01-27-23 but wants to know if there is any way she can come earlier. Will check with secretary and see if we can move her up any.

## 2023-01-11 NOTE — Telephone Encounter (Signed)
Patient is having a lot of pain she wants to be seen, has follow up for injections 01-27-23. Please call patient to discuss problem

## 2023-01-12 ENCOUNTER — Encounter: Payer: Self-pay | Admitting: Nurse Practitioner

## 2023-01-12 ENCOUNTER — Ambulatory Visit: Payer: 59 | Admitting: Physical Therapy

## 2023-01-12 ENCOUNTER — Ambulatory Visit (INDEPENDENT_AMBULATORY_CARE_PROVIDER_SITE_OTHER): Payer: Self-pay | Admitting: Nurse Practitioner

## 2023-01-12 VITALS — BP 130/68 | HR 93 | Temp 98.6°F | Resp 16 | Ht 63.0 in | Wt 143.2 lb

## 2023-01-12 DIAGNOSIS — F418 Other specified anxiety disorders: Secondary | ICD-10-CM

## 2023-01-12 DIAGNOSIS — M542 Cervicalgia: Secondary | ICD-10-CM | POA: Diagnosis not present

## 2023-01-12 DIAGNOSIS — E1169 Type 2 diabetes mellitus with other specified complication: Secondary | ICD-10-CM | POA: Diagnosis not present

## 2023-01-12 DIAGNOSIS — M67912 Unspecified disorder of synovium and tendon, left shoulder: Secondary | ICD-10-CM

## 2023-01-12 DIAGNOSIS — J0100 Acute maxillary sinusitis, unspecified: Secondary | ICD-10-CM

## 2023-01-12 DIAGNOSIS — Z79899 Other long term (current) drug therapy: Secondary | ICD-10-CM

## 2023-01-12 DIAGNOSIS — E119 Type 2 diabetes mellitus without complications: Secondary | ICD-10-CM

## 2023-01-12 LAB — POCT CBG (FASTING - GLUCOSE)-MANUAL ENTRY: Glucose Fasting, POC: 122 mg/dL — AB (ref 70–99)

## 2023-01-12 MED ORDER — DOXYCYCLINE HYCLATE 100 MG PO TABS
100.0000 mg | ORAL_TABLET | Freq: Two times a day (BID) | ORAL | 0 refills | Status: AC
Start: 2023-01-12 — End: 2023-01-22

## 2023-01-12 NOTE — Progress Notes (Signed)
[facility] [address] [place], [place] [address]  Internal MEDICINE  Office Visit Note  Patient Name: Summer Murphy  [id number]  [id number]  Date of Service: 01/12/2023  Chief Complaint  Patient presents with   Acute Visit    Hoarse and blood sugar high on Sunday     HPI Summer Murphy presents for an acute sick visit for feeling bad, high sugars and hoarseness --onset of symptoms was yesterday; has had some severe respiratory infections in the past couple of years.  Hoarseness, ear pain, ear fullness, fatigue, elevated blood sugar  Also Due for refills of clonazepam for anxiety Random glucose check in office was 122.     Current Medication:  Outpatient Encounter Medications as of 01/12/2023  Medication Sig   Accu-Chek Softclix Lancets lancets Use as instructed once a daily DX R730.01   alendronate (FOSAMAX) 70 MG tablet Take 1 tablet (70 mg total) by mouth once a week. Take with a full glass of water on an empty stomach.   Blood Glucose Monitoring Suppl (ACCU-CHEK GUIDE ME) w/Device KIT Use as directed once a day DX R730.01   busPIRone (BUSPAR) 10 MG tablet Take 1 tablet (10 mg total) by mouth 2 (two) times daily.   clonazePAM (KLONOPIN) 1 MG tablet Take 0.5 tablets (0.5 mg total) by mouth 2 (two) times daily as needed for anxiety.   diclofenac Sodium (VOLTAREN) 1 % GEL Apply 4 g topically 4 (four) times daily.   diltiazem (CARDIZEM CD) 240 MG 24 hr capsule TAKE ONE CAPSULE BY MOUTH DAILY   doxycycline (VIBRA-TABS) 100 MG tablet Take 1 tablet (100 mg total) by mouth 2 (two) times daily for 10 days. Take with food   DULoxetine (CYMBALTA) 60 MG capsule Take 1 capsule (60 mg total) by mouth at bedtime.   EPINEPHrine 0.3 mg/0.3 mL IJ SOAJ injection Inject 0.3 mg into the muscle as needed for anaphylaxis.   fluticasone (FLONASE) 50 MCG/ACT nasal spray Place 1 spray into both nostrils daily.   fluticasone-salmeterol (WIXELA INHUB) 250-50 MCG/ACT AEPB Inhale 1 puff  into the lungs in the morning and at bedtime.   gabapentin (NEURONTIN) 300 MG capsule TAKE 1 CAPSULE(300 MG) BY MOUTH TWICE DAILY   gabapentin (NEURONTIN) 600 MG tablet TAKE 1 TABLET(600 MG) BY MOUTH TWICE DAILY   glucose blood (ACCU-CHEK GUIDE) test strip Use as instructed once daily Dx R73.01   Ipratropium-Albuterol (COMBIVENT RESPIMAT) 20-100 MCG/ACT AERS respimat INHALE 1 PUFF BY MOUTH EVERY 6 HOURS   ipratropium-albuterol (DUONEB) 0.5-2.5 (3) MG/3ML SOLN Take 3 mLs by nebulization 4 (four) times daily.   lidocaine (XYLOCAINE) 2 % solution Use as directed 10 mLs in the mouth or throat every 6 (six) hours as needed for mouth pain.   methocarbamol (ROBAXIN) 750 MG tablet Take 1 tablet (750 mg total) by mouth every 6 (six) hours as needed for muscle spasms.   montelukast (SINGULAIR) 10 MG tablet TAKE 1 TABLET(10 MG) BY MOUTH AT BEDTIME   nystatin (MYCOSTATIN) 100000 UNIT/ML suspension Take 5 mLs (500,000 Units total) by mouth 4 (four) times daily.   ondansetron (ZOFRAN) 4 MG tablet TAKE 1 TABLET BY MOUTH TWICE DAILY AS NEEDED   pantoprazole (PROTONIX) 40 MG tablet Take 40 mg by mouth 2 (two) times daily.   pilocarpine (SALAGEN) 5 MG tablet Take 5 mg by mouth 2 (two) times daily.   rOPINIRole (REQUIP) 0.5 MG tablet TAKE 2 TABLETS BY MOUTH TWICE DAILY FOR RESTLESS LEGS   SUMAtriptan (IMITREX) 100 MG tablet Take  1 tablet (100 mg total) by mouth every 2 (two) hours as needed (for migraine headaches.). May repeat in 1 hours if headache persists or recurs.   tirzepatide [facility]) 2.5 MG/0.5ML Pen Inject 2.5 mg into the skin once a week.   traZODone (DESYREL) 150 MG tablet TAKE 1 TABLET(150 MG) BY MOUTH AT BEDTIME   No facility-administered encounter medications on file as of 01/12/2023.      Medical History: Past Medical History:  Diagnosis Date   Acid reflux    Anemia    Anxiety    Arrhythmia    treated with meds and has no current problems   Arthritis    most uncomfortable in knees    Asthma    uses several inhalers   Depression    Fever blister    Hematuria    History of kidney stones    Hypertension    Hypoglycemia    Left flank pain    Migraine    Pre-diabetes    Restless leg    Yeast vaginitis      Vital Signs: BP 130/68   Pulse 93   Temp 98.6 F (37 C)   Resp 16   Ht 5\' 3"  (1.6 m)   Wt 143 lb 3.2 oz (65 kg)   SpO2 94%   BMI 25.37 kg/m    Review of Systems  Constitutional:  Positive for fatigue. Negative for chills and unexpected weight change.  HENT:  Positive for congestion, ear pain, postnasal drip and voice change. Negative for rhinorrhea, sneezing and sore throat.   Eyes:  Negative for redness.  Respiratory:  Negative for cough, chest tightness, shortness of breath and wheezing.   Cardiovascular:  Negative for chest pain and palpitations.  Gastrointestinal:  Negative for abdominal pain, constipation, diarrhea, nausea and vomiting.  Genitourinary:  Negative for dysuria and frequency.  Musculoskeletal:  Negative for arthralgias, back pain, joint swelling and neck pain.  Skin:  Negative for rash.  Neurological: Negative.  Negative for tremors and numbness.  Hematological:  Negative for adenopathy. Does not bruise/bleed easily.  Psychiatric/Behavioral:  Negative for behavioral problems (Depression), sleep disturbance and suicidal ideas. The patient is not nervous/anxious.     Physical Exam Vitals reviewed.  Constitutional:      General: She is not in acute distress.    Appearance: Normal appearance. She is not ill-appearing.  HENT:     Head: Normocephalic.  Eyes:     Pupils: Pupils are equal, round, and reactive to light.  Cardiovascular:     Rate and Rhythm: Normal rate and regular rhythm.  Pulmonary:     Effort: Pulmonary effort is normal. No respiratory distress.  Neurological:     Mental Status: She is alert and oriented to person, place, and time.  Psychiatric:        Mood and Affect: Mood normal.        Behavior: Behavior  normal.       Assessment/Plan: 1. Acute non-recurrent maxillary sinusitis Take doxycycline as prescribed, take until gone.  - doxycycline (VIBRA-TABS) 100 MG tablet; Take 1 tablet (100 mg total) by mouth 2 (two) times daily for 10 days. Take with food  Dispense: 20 tablet; Refill: 0  2. Type 2 diabetes mellitus with other specified complication, without long-term current use of insulin (HCC) Glucose is stable - POCT CBG (Fasting - Glucose)  3. Situational anxiety Continue clonazepam as prescribed. Follow up in 3 months for additional refills. UDS due at next visit . - clonazePAM (KLONOPIN)  1 MG tablet; Take 0.5-1 tablets (0.5-1 mg total) by mouth 2 (two) times daily as needed for anxiety.  Dispense: 60 tablet; Refill: 2    General Counseling: Izola verbalizes understanding of the findings of todays visit and agrees with plan of treatment. I have discussed any further diagnostic evaluation that may be needed or ordered today. We also reviewed her medications today. she has been encouraged to call the office with any questions or concerns that should arise related to todays visit.    Counseling:    Orders Placed This Encounter  Procedures   POCT CBG (Fasting - Glucose)    Meds ordered this encounter  Medications   doxycycline (VIBRA-TABS) 100 MG tablet    Sig: Take 1 tablet (100 mg total) by mouth 2 (two) times daily for 10 days. Take with food    Dispense:  20 tablet    Refill:  0    Fill new script today    Return if symptoms worsen or fail to improve.  [place] Controlled Substance Database was reviewed by me for overdose risk score (ORS)  Time spent:10 Minutes Time spent with patient included reviewing progress notes, labs, imaging studies, and discussing plan for follow up.   This patient was seen by [name], FNP-C in collaboration with Dr. Beverely Risen as a part of collaborative care agreement.  Duayne Brideau R. Tedd Sias, MSN, FNP-C Internal Medicine
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
## 2023-01-13 ENCOUNTER — Telehealth: Payer: Self-pay

## 2023-01-14 ENCOUNTER — Ambulatory Visit: Payer: 59 | Admitting: Physical Therapy

## 2023-01-14 DIAGNOSIS — M542 Cervicalgia: Secondary | ICD-10-CM

## 2023-01-14 DIAGNOSIS — M67912 Unspecified disorder of synovium and tendon, left shoulder: Secondary | ICD-10-CM
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
## 2023-01-15 MED ORDER — CLONAZEPAM 1 MG PO TABS: 0.5000 mg | ORAL_TABLET | Freq: Two times a day (BID) | ORAL | 2 refills | Status: DC | PRN

## 2023-01-15 NOTE — Telephone Encounter (Signed)
Pt was notified.  

## 2023-01-19 ENCOUNTER — Ambulatory Visit: Payer: 59 | Admitting: Physical Therapy

## 2023-01-19 DIAGNOSIS — M67912 Unspecified disorder of synovium and tendon, left shoulder: Secondary | ICD-10-CM

## 2023-01-19 DIAGNOSIS — M542 Cervicalgia: Secondary | ICD-10-CM | POA: Diagnosis not present
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
## 2023-01-25 ENCOUNTER — Ambulatory Visit: Payer: 59 | Admitting: Student in an Organized Health Care Education/Training Program

## 2023-01-25 ENCOUNTER — Telehealth: Payer: Self-pay

## 2023-01-25 NOTE — Telephone Encounter (Signed)
Pt called that its ok to take RSV as per alyssa is ok to take as per alyssa advised is ok to take RSV vaccine and advised if her if she need pres call us back

## 2023-01-26 ENCOUNTER — Other Ambulatory Visit: Payer: Self-pay | Admitting: Nurse Practitioner

## 2023-01-26 DIAGNOSIS — Z79899 Other long term (current) drug therapy: Secondary | ICD-10-CM

## 2023-01-26 DIAGNOSIS — G63 Polyneuropathy in diseases classified elsewhere: Secondary | ICD-10-CM

## 2023-01-27 ENCOUNTER — Encounter: Payer: Self-pay | Admitting: Student in an Organized Health Care Education/Training Program

## 2023-01-27 ENCOUNTER — Ambulatory Visit
Payer: 59 | Attending: Student in an Organized Health Care Education/Training Program | Admitting: Student in an Organized Health Care Education/Training Program

## 2023-01-27 VITALS — Resp 16 | Ht 63.0 in | Wt 145.0 lb

## 2023-01-27 DIAGNOSIS — M4802 Spinal stenosis, cervical region: Secondary | ICD-10-CM | POA: Insufficient documentation

## 2023-01-27 DIAGNOSIS — M5412 Radiculopathy, cervical region: Secondary | ICD-10-CM | POA: Diagnosis present

## 2023-01-27 DIAGNOSIS — G894 Chronic pain syndrome: Secondary | ICD-10-CM | POA: Insufficient documentation

## 2023-01-27 MED ORDER — HYDROCODONE-ACETAMINOPHEN 5-325 MG PO TABS: 1.0000 | ORAL_TABLET | Freq: Two times a day (BID) | ORAL | 0 refills | Status: DC | PRN

## 2023-01-27 NOTE — Progress Notes (Signed)
PROVIDER NOTE: Information contained herein reflects review and annotations entered in association with encounter. Interpretation of such information and data should be left to medically-trained personnel. Information provided to patient can be located elsewhere in the medical record under "Patient Instructions". Document created using STT-dictation technology, any transcriptional errors that may result from process are unintentional.    Patient: Summer Murphy  Service Category: E/M  Provider: Edward Jolly, MD  DOB: 1955-08-12  DOS: 01/27/2023  Referring Provider: [name], NP  MRN: [id number]  Specialty: Interventional Pain Management  PCP: [name], NP  Type: Established Patient  Setting: Ambulatory outpatient    Location: Office  Delivery: Face-to-face     HPI  Summer Murphy, a 67 y.o. year old female, is here today because of her Cervical radicular pain [M54.12]. Ms. Harwick primary complain today is Neck Pain (L>R)  Pertinent problems: Ms. Waddle does not have any pertinent problems on file. Pain Assessment: Severity of Chronic pain is reported as a 9 /10. Location:   Posterior, Right, Left/Pain radaites from back of neck down to left shoulder down to upper left arm. Sometimes can radiate down right arm. Onset: More than a month ago. Quality: Constant, Numbness, Sharp, Pins and needles, Penetrating. Timing: Constant. Modifying factor(s): Ice. Vitals:  height is 5\' 3"  (1.6 m) and weight is 145 lb (65.8 kg). Her respiration is 16.  BMI: Estimated body mass index is 25.69 kg/m as calculated from the following:   Height as of this encounter: 5\' 3"  (1.6 m).   Weight as of this encounter: 145 lb (65.8 kg). Last encounter: 12/10/2022. Last procedure: 12/23/2022.  Reason for encounter: post-procedure evaluation and assessment & medication management   Post-procedure evaluation   Procedure: Cervical Epidural Steroid injection (CESI) (Interlaminar) #1  Laterality:  Left  Level: C7-T1 Imaging: Fluoroscopy-assisted DOS: 12/23/2022  Performed by: Edward Jolly, MD Anesthesia: Local anesthesia (1-2% Lidocaine) Sedation: Minimal Sedation                         Purpose: Diagnostic/Therapeutic Indications: Cervicalgia, cervical radicular pain, degenerative disc disease, severe enough to impact quality of life or function. 1. Cervical radicular pain (left severe)   2. Foraminal stenosis of cervical region (severe b/l C5/6; C6/7)   3. Spinal stenosis in cervical region    NAS-11 score:   Pre-procedure: 8 /10   Post-procedure: 4 /10      Effectiveness:  Initial hour after procedure: 100 %  Subsequent 4-6 hours post-procedure: 100 %  Analgesia past initial 6 hours: 0 % (Pain returned fully by day 2)  Ongoing improvement:  Analgesic:  0% Function: Back to baseline ROM: Back to baseline     ROS  Constitutional: Denies any fever or chills Gastrointestinal: No reported hemesis, hematochezia, vomiting, or acute GI distress Musculoskeletal:  Cervical spine pain with radiation into left arm and elbow Neurological: No reported episodes of acute onset apraxia, aphasia, dysarthria, agnosia, amnesia, paralysis, loss of coordination, or loss of consciousness  Medication Review  Accu-Chek Guide Me, Accu-Chek Softclix Lancets, DULoxetine, EPINEPHrine, HYDROcodone-acetaminophen, Ipratropium-Albuterol, SUMAtriptan, alendronate, busPIRone, clonazePAM, diclofenac Sodium, diltiazem, fluticasone, fluticasone-salmeterol, gabapentin, glucose blood, ibuprofen, ipratropium-albuterol, lidocaine, methocarbamol, montelukast, nystatin, ondansetron, pantoprazole, pilocarpine, rOPINIRole, tirzepatide, and traZODone  History Review  Allergy: Summer Murphy is allergic to librium [chlordiazepoxide], metoclopramide, vanilla, aspirin, nsaids, azithromycin, buprenorphine hcl, chlordiazepoxide hcl, morphine, sulfa antibiotics, tolmetin, amlodipine besylate, and iron. Drug: Ms.  Murphy  reports no history of drug use. Alcohol:  reports no history  of alcohol use. Tobacco:  reports that she has never smoked. She has been exposed to tobacco smoke. She has never used smokeless tobacco. Social: Ms. Seivert  reports that she has never smoked. She has been exposed to tobacco smoke. She has never used smokeless tobacco. She reports that she does not drink alcohol and does not use drugs. Medical:  has a past medical history of Acid reflux, Anemia, Anxiety, Arrhythmia, Arthritis, Asthma, Depression, Fever blister, Hematuria, History of kidney stones, Hypertension, Hypoglycemia, Left flank pain, Migraine, Pre-diabetes, Restless leg, and Yeast vaginitis. Surgical: Summer Murphy  has a past surgical history that includes Ureteroscopy; Laparoscopic hysterectomy; Lithotripsy; HH repair; Cardiac electrophysiology study and ablation; Appendectomy (1990); Cholecystectomy (1990); Joint replacement (Bilateral, [id number]); Extracorporeal shock wave lithotripsy (Left, 09/12/2015); Tonsillectomy; Corneal transplant; Abdominal hysterectomy; Breast surgery; Fracture surgery; Amputation toe (Left, 07/31/2016); Excision bone cyst (Left, 07/31/2016); Colonoscopy with propofol (N/A, 01/04/2017); Esophagogastroduodenoscopy (egd) with propofol (N/A, 01/04/2017); Back surgery; Total knee revision (Right, 11/14/2019); periprosthetic supracondylar fracture of left femur (02/16/2020); Hardware Removal (Left, 11/14/2020); Esophagogastroduodenoscopy (N/A, 04/09/2021); and Gum surgery (Left). Family: family history includes Breast cancer in her maternal aunt; Diabetes in her brother; Hypertension in her brother and mother; Kidney Stones in her father; Prostate cancer in her father; Stroke in her mother.  Laboratory Chemistry Profile   Renal Lab Results  Component Value Date   BUN 22 08/07/2022   CREATININE 0.59 08/07/2022   BCR 17 01/15/2022   GFRAA >60 11/16/2019   GFRNONAA >60 08/07/2022    Hepatic Lab  Results  Component Value Date   AST 28 06/01/2022   ALT 28 06/01/2022   ALBUMIN 3.6 06/01/2022   ALKPHOS 74 06/01/2022   LIPASE 25 05/12/2022    Electrolytes Lab Results  Component Value Date   NA 137 08/07/2022   K 3.6 08/07/2022   CL 103 08/07/2022   CALCIUM 9.4 09/30/2022   MG 1.8 04/03/2019   PHOS 3.3 04/03/2019    Bone Lab Results  Component Value Date   VD25OH 38.1 08/29/2021    Inflammation (CRP: Acute Phase) (ESR: Chronic Phase) Lab Results  Component Value Date   CRP 0.5 09/30/2022   ESRSEDRATE 11 08/07/2022   LATICACIDVEN 1.2 06/01/2022         Note: Above Lab results reviewed.  Recent Imaging Review   Narrative & Impression  CLINICAL DATA:  Posterior neck pain running down the left arm with   EXAM: MRI CERVICAL SPINE WITHOUT CONTRAST   TECHNIQUE: Multiplanar, multisequence MR imaging of the cervical spine was performed. No intravenous contrast was administered.   COMPARISON:  Numbness.  Cervical spine MRI 10/29/2009   FINDINGS: Image quality is significantly degraded by motion artifact.   Alignment: Normal.   Vertebrae: Vertebral body heights are preserved. Background marrow signal is normal. There is no suspicious marrow signal abnormality or marrow edema.   Cord: There is no definite cord signal abnormality, though evaluation is significantly degraded by motion artifact.   Posterior Fossa, vertebral arteries, paraspinal tissues: The imaged posterior fossa is unremarkable. The vertebral artery flow voids are normal. The paraspinal soft tissues are unremarkable.   Disc levels:   C2-C3: No significant spinal canal or neural foraminal stenosis   C3-C4: There is a posterior disc osteophyte complex with right worse than left uncovertebral ridging and bilateral facet arthropathy with ligamentum flavum thickening resulting in at least moderate spinal canal stenosis with likely mass effect on the cord, and at least moderate right and mild  left neural foraminal stenosis.  C4-C5: There is a shallow posterior disc osteophyte complex with probable mild-to-moderate spinal canal stenosis. There is uncovertebral and facet arthropathy, but the neural foramina are not well assessed due to motion artifact.   C5-C6: There is uncovertebral and facet arthropathy with probable moderate spinal canal stenosis, and possibly severe bilateral neural foraminal stenosis.   C6-C7: There is suspected disc bulge contributing to mild spinal canal stenosis and possibly severe bilateral neural foraminal stenosis   C7-T1: No significant spinal canal or neural foraminal stenosis.   IMPRESSION: 1. Image quality is significantly degraded by motion artifact, limiting evaluation of the spinal canal and neural foramina. 2. Within this confine, there is probably at least moderate spinal canal stenosis at C3-C4 and C5-C6, mild-to-moderate spinal canal stenosis at C4-C5, and mild spinal canal stenosis at C6-C7. Cord signal is not well assessed due to artifact. CT myelogram may be considered as indicated given less susceptbility to motion artifact. 3. At least moderate right and mild left neural foraminal stenosis at C3-C4, and possibly severe bilateral neural foraminal stenosis at C5-C6 and C6-C7.     Electronically Signed   By: Lesia Hausen M.D.   Note: Reviewed        Physical Exam  General appearance: Well nourished, well developed, and well hydrated. In no apparent acute distress Mental status: Alert, oriented x 3 (person, place, & time)       Respiratory: No evidence of acute respiratory distress Eyes: PERLA Vitals: Resp 16   Ht 5\' 3"  (1.6 m)   Wt 145 lb (65.8 kg)   BMI 25.69 kg/m  BMI: Estimated body mass index is 25.69 kg/m as calculated from the following:   Height as of this encounter: 5\' 3"  (1.6 m).   Weight as of this encounter: 145 lb (65.8 kg). Ideal: Ideal body weight: 52.4 kg (115 lb 8.3 oz) Adjusted ideal body weight:  57.7 kg (127 lb 5 oz)  Cervical Spine Area Exam  Skin & Axial Inspection: No masses, redness, edema, swelling, or associated skin lesions Alignment: Symmetrical Functional ROM: Pain restricted ROM, to the left Stability: No instability detected Muscle Tone/Strength: Functionally intact. No obvious neuro-muscular anomalies detected. Sensory (Neurological): Dermatomal pain pattern left C5/6 Palpation: No palpable anomalies             + Spurlings left  Upper Extremity (UE) Exam      Side: Right upper extremity   Side: Left upper extremity  Skin & Extremity Inspection: Skin color, temperature, and hair growth are WNL. No peripheral edema or cyanosis. No masses, redness, swelling, asymmetry, or associated skin lesions. No contractures.   Skin & Extremity Inspection: Skin color, temperature, and hair growth are WNL. No peripheral edema or cyanosis. No masses, redness, swelling, asymmetry, or associated skin lesions. No contractures.  Functional ROM: Unrestricted ROM           Functional ROM: Unrestricted ROM          Muscle Tone/Strength: Functionally intact. No obvious neuro-muscular anomalies detected.   Muscle Tone/Strength: Functionally intact. No obvious neuro-muscular anomalies detected.  Sensory (Neurological): Unimpaired           Sensory (Neurological): Dermatomal pain pattern          Palpation: No palpable anomalies               Palpation: No palpable anomalies              Provocative Test(s):  Phalen's test: deferred Tinel's test: deferred Apley's scratch test (touch  opposite shoulder):  Action 1 (Across chest): deferred Action 2 (Overhead): deferred Action 3 (LB reach): deferred     Provocative Test(s):  Phalen's test: deferred Tinel's test: deferred Apley's scratch test (touch opposite shoulder):  Action 1 (Across chest): Decreased ROM Action 2 (Overhead): Decreased ROM Action 3 (LB reach): Decreased ROM    Assessment   Diagnosis Status  1. Cervical radicular pain  (left severe)   2. Foraminal stenosis of cervical region (severe b/l C5/6; C6/7)   3. Spinal stenosis in cervical region   4. Chronic pain syndrome    Persistent Persistent Persistent   Updated Problems: No problems updated.  Plan of Care   Unfortunately, no significant benefit with cervical ESI #1.  We discussed repeating injection and seeing if she has a longer duration of response. I also recommend that she follow-up with Dr. Katrinka Blazing to consider surgical decompression She is on high-dose gabapentin and is having severe pain that she states is bringing her into tears.  Prescription for small quantity of hydrocodone as below.  I will have her complete urine toxicology screen which is customary for new patients.  She will sign pain contract.  I informed her that this will be a small prescription until we have a more definitive plan.   Pharmacotherapy (Medications Ordered): Meds ordered this encounter  Medications   HYDROcodone-acetaminophen (NORCO/VICODIN) 5-325 MG tablet    Sig: Take 1 tablet by mouth every 12 (twelve) hours as needed for up to 15 days for severe pain (pain score 7-10).    Dispense:  30 tablet    Refill:  0    Chronic Pain: STOP Act (Not applicable) Fill 1 day early if closed on refill date. Avoid benzodiazepines within 8 hours of opioids   Orders:  Orders Placed This Encounter  Procedures   Cervical Epidural Injection    Sedation: Patient's choice. Purpose: Diagnostic/Therapeutic Indication(s): Radiculitis and cervicalgia associater with cervical degenerative disc disease.    Standing Status:   Future    Standing Expiration Date:   04/29/2023    Scheduling Instructions:     Procedure: Cervical Epidural Steroid Injection/Block     Level(s): C7-T1     Laterality: LEFT     Timeframe: As soon as schedule allows    Order Specific Question:   Where will this procedure be performed?    Answer:   [facility]    Comments:   Fina Heizer   [facility],  [facility]    Volume: 30 ml(s). Minimum 3 ml of urine is needed. Document temperature of fresh sample. Indications: Long term (current) use of opiate analgesic (Y86.578) Test#: [phone] (Comprehensive Profile)    Order Specific Question:   Release to patient    Answer:   Immediate   Follow-up plan:   Return in about 2 weeks (around 02/10/2023) for Left C-ESI #2, in clinic IV Versed.      Recent Visits Date Type Provider Dept  12/23/22 Procedure visit Edward Jolly, MD [facility]  12/10/22 Office Visit Edward Jolly, MD [facility]  Showing recent visits within past 90 days and meeting all other requirements Today's Visits Date Type Provider Dept  01/27/23 Office Visit Edward Jolly, MD [facility]  Showing today's visits and meeting all other requirements Future Appointments Date Type Provider Dept  02/10/23 Appointment Edward Jolly, MD [facility]  Showing future appointments within next 90 days and meeting all other requirements  I discussed the assessment and treatment plan with the patient.  The patient was provided an opportunity to ask questions and all were answered. The patient agreed with the plan and demonstrated an understanding of the instructions.  Patient advised to call back or seek an in-person evaluation if the symptoms or condition worsens.  Duration of encounter: .  Total time on encounter, as per AMA guidelines included both the face-to-face and non-face-to-face time personally spent by the physician and/or other qualified health care professional(s) on the day of the encounter (includes time in activities that require the physician or other qualified health care professional and does not include time in activities normally performed by clinical staff). Physician's time may include the following activities when performed: Preparing to see the patient (e.g., pre-charting review of records, searching for previously ordered  imaging, lab work, and nerve conduction tests) Review of prior analgesic pharmacotherapies. Reviewing PMP Interpreting ordered tests (e.g., lab work, imaging, nerve conduction tests) Performing post-procedure evaluations, including interpretation of diagnostic procedures Obtaining and/or reviewing separately obtained history Performing a medically appropriate examination and/or evaluation Counseling and educating the patient/family/caregiver Ordering medications, tests, or procedures Referring and communicating with other health care professionals (when not separately reported) Documenting clinical information in the electronic or other health record Independently interpreting results (not separately reported) and communicating results to the patient/ family/caregiver Care coordination (not separately reported)  Note by: Edward Jolly, MD Date: 01/27/2023; Time: 3:54 PM

## 2023-01-27 NOTE — Patient Instructions (Addendum)
______________________________________________________________________    Preparing for your procedure  Appointments: If you think you may not be able to keep your appointment, call 24-48 hours in advance to cancel. We need time to make it available to others.  During your procedure appointment there will be: No Prescription Refills. No disability issues to discussed. No medication changes or discussions.  Instructions: Food intake: Avoid eating anything solid for at least 8 hours prior to your procedure. Clear liquid intake: You may take clear liquids such as water up to 2 hours prior to your procedure. (No carbonated drinks. No soda.) Transportation: Unless otherwise stated by your physician, bring a driver. (Driver cannot be a Market researcher, Pharmacist, community, or any other form of public transportation.) Morning Medicines: Except for blood thinners, take all of your other morning medications with a sip of water. Make sure to take your heart and blood pressure medicines. If your blood pressure's lower number is above 100, the case will be rescheduled. Blood thinners: Make sure to stop your blood thinners as instructed.  If you take a blood thinner, but were not instructed to stop it, call our office [phone] and ask to talk to a nurse. Not stopping a blood thinner prior to certain procedures could lead to serious complications. Diabetics on insulin: Notify the staff so that you can be scheduled 1st case in the morning. If your diabetes requires high dose insulin, take only  of your normal insulin dose the morning of the procedure and notify the staff that you have done so. Preventing infections: Shower with an antibacterial soap the morning of your procedure.  Build-up your immune system: Take 1000 mg of Vitamin C with every meal (3 times a day) the day prior to your procedure. Antibiotics: Inform the nursing staff if you are taking any antibiotics or if you have any conditions that may require antibiotics  prior to procedures. (Example: recent joint implants)   Pregnancy: If you are pregnant make sure to notify the nursing staff. Not doing so may result in injury to the fetus, including death.  Sickness: If you have a cold, fever, or any active infections, call and cancel or reschedule your procedure. Receiving steroids while having an infection may result in complications. Arrival: You must be in the facility at least 30 minutes prior to your scheduled procedure. Tardiness: Your scheduled time is also the cutoff time. If you do not arrive at least 15 minutes prior to your procedure, you will be rescheduled.  Children: Do not bring any children with you. Make arrangements to keep them home. Dress appropriately: There is always a possibility that your clothing may get soiled. Avoid long dresses. Valuables: Do not bring any jewelry or valuables.  Reasons to call and reschedule or cancel your procedure: (Following these recommendations will minimize the risk of a serious complication.) Surgeries: Avoid having procedures within 2 weeks of any surgery. (Avoid for 2 weeks before or after any surgery). Flu Shots: Avoid having procedures within 2 weeks of a flu shots or . (Avoid for 2 weeks before or after immunizations). Barium: Avoid having a procedure within 7-10 days after having had a radiological study involving the use of radiological contrast. (Myelograms, Barium swallow or enema study). Heart attacks: Avoid any elective procedures or surgeries for the initial 6 months after a "Myocardial Infarction" (Heart Attack). Blood thinners: It is imperative that you stop these medications before procedures. Let us know if you if you take any blood thinner.  Infection: Avoid procedures during or within  two weeks of an infection (including chest colds or gastrointestinal problems). Symptoms associated with infections include: Localized redness, fever, chills, night sweats or profuse sweating, burning sensation  when voiding, cough, congestion, stuffiness, runny nose, sore throat, diarrhea, nausea, vomiting, cold or Flu symptoms, recent or current infections. It is specially important if the infection is over the area that we intend to treat. Heart and lung problems: Symptoms that may suggest an active cardiopulmonary problem include: cough, chest pain, breathing difficulties or shortness of breath, dizziness, ankle swelling, uncontrolled high or unusually low blood pressure, and/or palpitations. If you are experiencing any of these symptoms, cancel your procedure and contact your primary care physician for an evaluation.  Remember:  Regular Business hours are:  Monday to Thursday 8:00 AM to 4:00 PM  Provider's Schedule: Delano Metz, MD:  Procedure days: Tuesday and Thursday 7:30 AM to 4:00 PM  Edward Jolly, MD:  Procedure days: Monday and Wednesday 7:30 AM to 4:00 PM Last  Updated: 11/10/2022 ______________________________________________________________________    Epidural Steroid Injection  An epidural steroid injection is a shot of steroid medicine, also called cortisone, and a numbing medicine that is given into the epidural space. This space is between the spinal cord and the bones of the back. This shot helps relieve pain caused by an irritated or swollen nerve root. The pain relief you get from the injection depends on the cause of your condition and how long your pain lasts. You may have a period of slightly more pain after your injection, before the steroid medicine takes effect. This medicine usually starts working within 1-3 days. In some cases, you might need 7-10 days to feel the full effect. Tell your health care provider about: Any allergies you have. All medicines you are taking, including vitamins, herbs, eye drops, creams, and over-the-counter medicines. Any problems you or family members have had with anesthesia. Any bleeding problems you have. Any surgeries you have  had. Any medical conditions you have. Whether you are pregnant or may be pregnant. What are the risks? Your health care provider will talk with you about risks. These may include: Headache. Bleeding. Infection. Allergic reaction to medicines or dyes. Nerve damage. Not being able to move (paralysis). This is rare. What happens before the procedure? Medicines You may be given medicines to lower anxiety. Ask your provider about: Changing or stopping your regular medicines. These include any diabetes medicines or blood thinners you take. Taking medicines such as aspirin and ibuprofen. These medicines can thin your blood. Do not take them unless your provider tells you to. Taking over-the-counter medicines, vitamins, herbs, and supplements. General instructions Follow instructions from your provider about what you may eat and drink. Ask your provider what steps will be taken to help prevent infection. If you will be going home right after the procedure, plan to have a responsible adult: Take you home from the hospital or clinic. You will not be allowed to drive. Care for you for the time you are told. What happens during the procedure?  An IV will be inserted into one of your veins. You may be given a sedative to help you relax. You will be asked to sit or lie on your side. The injection site will be cleaned. An X-ray machine will be used to guide the needle close to the nerve that is causing pain. A needle will be put through your skin into the epidural space. This may cause you some discomfort. Contrast dye may be injected at the site  to make sure that the steroid medicine will be sent to the exact place it needs to go. The steroid medicine and a numbing medicine will be injected into the epidural space for pain relief. The needle will be removed. A bandage (dressing) will be put over the injection site. The procedure may vary among providers and hospitals. What happens after the  procedure? Your blood pressure, heart rate, breathing rate, and blood oxygen level will be monitored until you leave the hospital or clinic. Your IV will be removed. Your arm or leg may feel weak or numb for a few hours. This information is not intended to replace advice given to you by your health care provider. Make sure you discuss any questions you have with your health care provider. Document Revised: 10/17/2021 Document Reviewed: 10/17/2021 Elsevier Patient Education  2024 [employer]. Sign pain contract

## 2023-01-27 NOTE — Progress Notes (Signed)
Safety precautions to be maintained throughout the outpatient stay will include: orient to surroundings, keep bed in low position, maintain call bell within reach at all times, provide assistance with transfer out of bed and ambulation.    UDS 01/27/2023 MM 02/11/2023 Pain contract signed and copy given

## 2023-01-28 ENCOUNTER — Encounter: Payer: 59 | Admitting: Physical Therapy

## 2023-01-29 ENCOUNTER — Inpatient Hospital Stay: Payer: 59 | Attending: Oncology

## 2023-01-30 LAB — COMPLIANCE DRUG ANALYSIS, UR

## 2023-02-01 ENCOUNTER — Inpatient Hospital Stay: Payer: 59 | Admitting: Oncology

## 2023-02-01 ENCOUNTER — Encounter: Payer: Self-pay | Admitting: Oncology

## 2023-02-01 ENCOUNTER — Inpatient Hospital Stay: Payer: 59

## 2023-02-02 ENCOUNTER — Encounter: Payer: 59 | Admitting: Physical Therapy

## 2023-02-03 ENCOUNTER — Ambulatory Visit: Payer: 59 | Admitting: Neurosurgery

## 2023-02-03 ENCOUNTER — Encounter: Payer: Self-pay | Admitting: Neurosurgery

## 2023-02-03 ENCOUNTER — Ambulatory Visit (INDEPENDENT_AMBULATORY_CARE_PROVIDER_SITE_OTHER): Payer: 59 | Admitting: Neurosurgery

## 2023-02-03 VITALS — BP 128/60 | Ht 63.0 in | Wt 139.0 lb

## 2023-02-03 DIAGNOSIS — M4802 Spinal stenosis, cervical region: Secondary | ICD-10-CM

## 2023-02-03 DIAGNOSIS — G959 Disease of spinal cord, unspecified: Secondary | ICD-10-CM

## 2023-02-03 DIAGNOSIS — G992 Myelopathy in diseases classified elsewhere: Secondary | ICD-10-CM

## 2023-02-03 DIAGNOSIS — M5010 Cervical disc disorder with radiculopathy, unspecified cervical region: Secondary | ICD-10-CM

## 2023-02-03 NOTE — Progress Notes (Signed)
Referring Physician:  [name], NP [address] [place],  [place] [address]  Primary Physician:  [name], NP  History of Present Illness: 02/03/2023 Summer Murphy is here today with a chief complaint of neck pain radiating mostly into the left shoulder as well as numbness and tingling into the hands and fingers.  This is now been going on for 4 to 5 months.  She feels pain in her back radiating down her arms bilaterally, left worse than right.  Over the past few months she has noticed worsening ambulation and falls. She has had a previous lumbar surgery but no previous cervical surgeries.    I have utilized the care everywhere function in epic to review the outside records available from external health systems.  Review of Systems:  A 10 point review of systems is negative, except for the pertinent positives and negatives detailed in the HPI.  Past Medical History: Past Medical History:  Diagnosis Date   Acid reflux    Anemia    Anxiety    Arrhythmia    treated with meds and has no current problems   Arthritis    most uncomfortable in knees   Asthma    uses several inhalers   Depression    Fever blister    Hematuria    History of kidney stones    Hypertension    Hypoglycemia    Left flank pain    Migraine    Pre-diabetes    Restless leg    Yeast vaginitis     Past Surgical History: Past Surgical History:  Procedure Laterality Date   ABDOMINAL HYSTERECTOMY     AMPUTATION TOE Left 07/31/2016   Procedure: AMPUTATION TOE/MPJ 2nd toe;  Surgeon: Linus Galas, DPM;  Location: [facility] ORS;  Service: Podiatry;  Laterality: Left;   APPENDECTOMY  1990   BACK SURGERY     low back   BREAST SURGERY     bilateral breast reduction   CARDIAC ELECTROPHYSIOLOGY STUDY AND ABLATION     CHOLECYSTECTOMY  1990   COLONOSCOPY WITH PROPOFOL N/A 01/04/2017   Procedure: COLONOSCOPY WITH PROPOFOL;  Surgeon: Scot Jun, MD;  Location[facility]COPY;   Service: Endoscopy;  Laterality: N/A;   CORNEAL TRANSPLANT     ESOPHAGOGASTRODUODENOSCOPY N/A 04/09/2021   Procedure: ESOPHAGOGASTRODUODENOSCOPY (EGD);  Surgeon: Toney Reil, MD;  Location: [facility] ENDOSCOPY;  Service: Gastroenterology;  Laterality: N/A;   ESOPHAGOGASTRODUODENOSCOPY (EGD) WITH PROPOFOL N/A 01/04/2017   Procedure: ESOPHAGOGASTRODUODENOSCOPY (EGD) WITH PROPOFOL;  Surgeon: Scot Jun, MD;  Location: [facility] ENDOSCOPY;  Service: Endoscopy;  Laterality: N/A;   EXCISION BONE CYST Left 07/31/2016   Procedure: EXCISION BONE CYST/exostectomy 28124/left 2nd;  Surgeon: Linus Galas, DPM;  Location: [facility] OR[facility];  Service: Podiatry;  Laterality: Left;   EXTRACORPOREAL SHOCK WAVE LITHOTRIPSY Left 09/12/2015   Procedure: EXTRACORPOREAL SHOCK WAVE LITHOTRIPSY (ESWL);  Surgeon: Vanna Scotland, MD;  Location: [facility] ORS;  Service: Urology;  Laterality: Left;   FRACTURE SURGERY     left foot   GUM SURGERY Left    gum infection   HARDWARE REMOVAL Left 11/14/2020   Procedure: HARDWARE REMOVAL;  Surgeon: Kennedy Bucker, MD;  Location: [facility] ORS;  Service: Orthopedics;  Laterality: Left;   HH repair     Fundoplication   JOINT REPLACEMENT Bilateral 2013,2014   total knees   LAPAROSCOPIC HYSTERECTOMY     LITHOTRIPSY     periprosthetic supracondylar fracture of left femur  02/16/2020   [facility]   TONSILLECTOMY  TOTAL KNEE REVISION Right 11/14/2019   Procedure: Revision patella and tibial polyethylene;  Surgeon: Kennedy Bucker, MD;  Location: [facility] ORS;  Service: Orthopedics;  Laterality: Right;   URETEROSCOPY      Allergies: Allergies as of 02/03/2023 - Review Complete 02/03/2023  Allergen Reaction Noted   Librium [chlordiazepoxide] Shortness Of Breath 08/28/2014   Metoclopramide Hives and Other (See Comments) 08/28/2014   Vanilla Shortness Of Breath 09/10/2014   Aspirin Hives 08/28/2014   Nsaids Rash 11/21/2014   Azithromycin Other (See Comments) 11/21/2014   Buprenorphine hcl  Other (See Comments) 09/09/2015   Chlordiazepoxide hcl Other (See Comments) 11/21/2014   Morphine Other (See Comments) 08/28/2014   Sulfa antibiotics  11/21/2014   Tolmetin  09/09/2015   Amlodipine besylate Itching and Rash 05/10/2018   Iron Nausea And Vomiting 11/21/2014    Medications:  Current Outpatient Medications:    Accu-Chek Softclix Lancets lancets, Use as instructed once a daily DX R730.01, Disp: 100 each, Rfl: 12   alendronate (FOSAMAX) 70 MG tablet, Take 1 tablet (70 mg total) by mouth once a week. Take with a full glass of water on an empty stomach., Disp: 12 tablet, Rfl: 3   Blood Glucose Monitoring Suppl (ACCU-CHEK GUIDE ME) w/Device KIT, Use as directed once a day DX R730.01, Disp: 1 kit, Rfl: 0   busPIRone (BUSPAR) 10 MG tablet, Take 1 tablet (10 mg total) by mouth 2 (two) times daily., Disp: 60 tablet, Rfl: 2   clonazePAM (KLONOPIN) 1 MG tablet, Take 0.5-1 tablets (0.5-1 mg total) by mouth 2 (two) times daily as needed for anxiety., Disp: 60 tablet, Rfl: 2   diclofenac Sodium (VOLTAREN) 1 % GEL, Apply 4 g topically 4 (four) times daily., Disp: 350 g, Rfl: 1   diltiazem (CARDIZEM CD) 240 MG 24 hr capsule, TAKE ONE CAPSULE BY MOUTH DAILY, Disp: 90 capsule, Rfl: 3   DULoxetine (CYMBALTA) 60 MG capsule, Take 1 capsule (60 mg total) by mouth at bedtime., Disp: 90 capsule, Rfl: 1   EPINEPHrine 0.3 mg/0.3 mL IJ SOAJ injection, Inject 0.3 mg into the muscle as needed for anaphylaxis., Disp: 1 each, Rfl: 3   fluticasone (FLONASE) 50 MCG/ACT nasal spray, Place 1 spray into both nostrils daily., Disp: 18.2 mL, Rfl: 2   fluticasone-salmeterol (WIXELA INHUB) 250-50 MCG/ACT AEPB, Inhale 1 puff into the lungs in the morning and at bedtime., Disp: 60 each, Rfl: 12   gabapentin (NEURONTIN) 300 MG capsule, TAKE 1 CAPSULE(300 MG) BY MOUTH TWICE DAILY, Disp: 60 capsule, Rfl: 2   gabapentin (NEURONTIN) 600 MG tablet, TAKE 1 TABLET(600 MG) BY MOUTH TWICE DAILY, Disp: 60 tablet, Rfl: 3   glucose  blood (ACCU-CHEK GUIDE) test strip, Use as instructed once daily Dx R73.01, Disp: 100 each, Rfl: 12   ibuprofen (ADVIL) 200 MG tablet, Take 200 mg by mouth every 6 (six) hours as needed., Disp: , Rfl:    Ipratropium-Albuterol (COMBIVENT RESPIMAT) 20-100 MCG/ACT AERS respimat, INHALE 1 PUFF BY MOUTH EVERY 6 HOURS, Disp: 4 g, Rfl: 3   ipratropium-albuterol (DUONEB) 0.5-2.5 (3) MG/3ML SOLN, Take 3 mLs by nebulization 4 (four) times daily., Disp: 360 mL, Rfl: 11   lidocaine (XYLOCAINE) 2 % solution, Use as directed 10 mLs in the mouth or throat every 6 (six) hours as needed for mouth pain., Disp: 400 mL, Rfl: 1   montelukast (SINGULAIR) 10 MG tablet, TAKE 1 TABLET(10 MG) BY MOUTH AT BEDTIME, Disp: 90 tablet, Rfl: 1   nystatin (MYCOSTATIN) 100000 UNIT/ML suspension, Take 5 mLs (500,000  Units total) by mouth 4 (four) times daily., Disp: 120 mL, Rfl: 0   ondansetron (ZOFRAN) 4 MG tablet, TAKE 1 TABLET BY MOUTH TWICE DAILY AS NEEDED, Disp: 45 tablet, Rfl: 1   pantoprazole (PROTONIX) 40 MG tablet, Take 40 mg by mouth 2 (two) times daily., Disp: , Rfl:    pilocarpine (SALAGEN) 5 MG tablet, Take 5 mg by mouth 2 (two) times daily., Disp: , Rfl:    rOPINIRole (REQUIP) 0.5 MG tablet, TAKE 2 TABLETS BY MOUTH TWICE DAILY FOR RESTLESS LEGS, Disp: 360 tablet, Rfl: 1   SUMAtriptan (IMITREX) 100 MG tablet, Take 1 tablet (100 mg total) by mouth every 2 (two) hours as needed (for migraine headaches.). May repeat in 1 hours if headache persists or recurs., Disp: 10 tablet, Rfl: 3   tirzepatide (MOUNJARO) 2.5 MG/0.5ML Pen, Inject 2.5 mg into the skin once a week., Disp: 6 mL, Rfl: 2   traZODone (DESYREL) 150 MG tablet, TAKE 1 TABLET(150 MG) BY MOUTH AT BEDTIME, Disp: 30 tablet, Rfl: 2   HYDROcodone-acetaminophen (NORCO/VICODIN) 5-325 MG tablet, Take 1 tablet by mouth every 12 (twelve) hours as needed for up to 15 days for severe pain (pain score 7-10). (Patient not taking: Reported on 02/03/2023), Disp: 30 tablet, Rfl:  0  Social History: Social History   Tobacco Use   Smoking status: Never    Passive exposure: Past   Smokeless tobacco: Never  Vaping Use   Vaping status: Never Used  Substance Use Topics   Alcohol use: No   Drug use: No    Family Medical History: Family History  Problem Relation Age of Onset   Hypertension Mother    Stroke Mother    Prostate cancer Father    Kidney Stones Father    Diabetes Brother    Hypertension Brother    Breast cancer Maternal Aunt    Kidney disease Neg Hx     Physical Examination: Vitals:   02/03/23 0936  BP: 128/60    General: Patient is in no apparent distress. Attention to examination is appropriate.  Neck:   Supple.  Full range of motion.  Respiratory: Patient is breathing without any difficulty.   NEUROLOGICAL:     Awake, alert, oriented to person, place, and time.  Speech is clear and fluent.   Cranial Nerves: Pupils equal round and reactive to light.  Facial tone is symmetric.  Facial sensation is symmetric. Shoulder shrug is symmetric. Tongue protrusion is midline.    Strength: Giveaway weakness noted throughout the bilateral upper extremities, grip strength decreasing on the left.  Reflexes are 1+ with the exception of the following.   Hoffman's is positive on the left with spreading of her BR and biceps reflexes.      No evidence of dysmetria noted.  Gait has significantly worsened since last seen. Unable to tandem walk, unsteady on regular gait.   Imaging: Narrative & Impression  CLINICAL DATA:  Posterior neck pain running down the left arm with   EXAM: MRI CERVICAL SPINE WITHOUT CONTRAST   TECHNIQUE: Multiplanar, multisequence MR imaging of the cervical spine was performed. No intravenous contrast was administered.   COMPARISON:  Numbness.  Cervical spine MRI 10/29/2009   FINDINGS: Image quality is significantly degraded by motion artifact.   Alignment: Normal.   Vertebrae: Vertebral body heights are  preserved. Background marrow signal is normal. There is no suspicious marrow signal abnormality or marrow edema.   Cord: There is no definite cord signal abnormality, though evaluation is significantly degraded  by motion artifact.   Posterior Fossa, vertebral arteries, paraspinal tissues: The imaged posterior fossa is unremarkable. The vertebral artery flow voids are normal. The paraspinal soft tissues are unremarkable.   Disc levels:   C2-C3: No significant spinal canal or neural foraminal stenosis   C3-C4: There is a posterior disc osteophyte complex with right worse than left uncovertebral ridging and bilateral facet arthropathy with ligamentum flavum thickening resulting in at least moderate spinal canal stenosis with likely mass effect on the cord, and at least moderate right and mild left neural foraminal stenosis.   C4-C5: There is a shallow posterior disc osteophyte complex with probable mild-to-moderate spinal canal stenosis. There is uncovertebral and facet arthropathy, but the neural foramina are not well assessed due to motion artifact.   C5-C6: There is uncovertebral and facet arthropathy with probable moderate spinal canal stenosis, and possibly severe bilateral neural foraminal stenosis.   C6-C7: There is suspected disc bulge contributing to mild spinal canal stenosis and possibly severe bilateral neural foraminal stenosis   C7-T1: No significant spinal canal or neural foraminal stenosis.   IMPRESSION: 1. Image quality is significantly degraded by motion artifact, limiting evaluation of the spinal canal and neural foramina. 2. Within this confine, there is probably at least moderate spinal canal stenosis at C3-C4 and C5-C6, mild-to-moderate spinal canal stenosis at C4-C5, and mild spinal canal stenosis at C6-C7. Cord signal is not well assessed due to artifact. CT myelogram may be considered as indicated given less susceptbility to motion artifact. 3. At  least moderate right and mild left neural foraminal stenosis at C3-C4, and possibly severe bilateral neural foraminal stenosis at C5-C6 and C6-C7.     Electronically Signed   By: Lesia Hausen M.D.   On: 11/18/2022 16:36   Narrative & Impression  CLINICAL DATA:  Chronic neck pain.   EXAM: CERVICAL SPINE - COMPLETE 4+ VIEW   COMPARISON:  11/10/2022.   FINDINGS: Lateral neutral, flexion and extension views as well as AP view were acquired. C6 through T1 are not well visualized on the lateral view due to superimposed soft tissues.   No fracture, bone lesion or spondylolisthesis. Straightened cervical lordosis.   No subluxation with flexion or extension.   Mild loss of disc height throughout the visualized cervical spine. Bilateral facet degenerative changes.   Soft tissues are unremarkable.   IMPRESSION: 1. No fracture or acute finding. 2. No subluxation with flexion or extension. 3. No change from the recent prior exams.     Electronically Signed   By: Amie Portland M.D.   On: 12/09/2022 10:28      I have personally reviewed the images and agree with the above interpretation.  Medical Decision Making/Assessment and Plan: Ms. Totzke is a pleasant 67 y.o. female with significant cervicalgia and some radiating pain onto the left upper extremity.  Likely secondary to cervical radiculopathy and cervical spondylosis.  When we saw her last she was presenting mostly with radicular symptoms, and had a injection which did not give her significant improvement.  Unfortunately she has been developing progressive worsening myelopathy with decreased hand function and decreased ambulatory ability.  She has had her previous tooth infection treated.  Given her progressive cervical myelopathy we will plan for a cervical decompression and fusion, she has significant swallowing difficulties at baseline due to Barrett's esophagus and has had a recent dilatation.  Given her anatomical  alignment we will plan for a posterior approach in the setting of swallowing difficulties.  Thank you for involving  me in the care of this patient.   I spent 35 minutes on her care today, including record review, image interpretation, direct patient interaction with extensive counseling on conservative versus interventional therapy, risk discussions, and surgical planning.   Lovenia Kim MD/MSCR Neurosurgery

## 2023-02-03 NOTE — H&P (View-Only) (Signed)
 Referring Physician:  [name], NP [address] [place],  [place] [address]  Primary Physician:  [name], NP  History of Present Illness: 02/03/2023 Summer Murphy is here today with a chief complaint of neck pain radiating mostly into the left shoulder as well as numbness and tingling into the hands and fingers.  This is now been going on for 4 to 5 months.  She feels pain in her back radiating down her arms bilaterally, left worse than right.  Over the past few months she has noticed worsening ambulation and falls. She has had a previous lumbar surgery but no previous cervical surgeries.    I have utilized the care everywhere function in epic to review the outside records available from external health systems.  Review of Systems:  A 10 point review of systems is negative, except for the pertinent positives and negatives detailed in the HPI.  Past Medical History: Past Medical History:  Diagnosis Date   Acid reflux    Anemia    Anxiety    Arrhythmia    treated with meds and has no current problems   Arthritis    most uncomfortable in knees   Asthma    uses several inhalers   Depression    Fever blister    Hematuria    History of kidney stones    Hypertension    Hypoglycemia    Left flank pain    Migraine    Pre-diabetes    Restless leg    Yeast vaginitis     Past Surgical History: Past Surgical History:  Procedure Laterality Date   ABDOMINAL HYSTERECTOMY     AMPUTATION TOE Left 07/31/2016   Procedure: AMPUTATION TOE/MPJ 2nd toe;  Surgeon: Linus Galas, DPM;  Location: [facility] ORS;  Service: Podiatry;  Laterality: Left;   APPENDECTOMY  1990   BACK SURGERY     low back   BREAST SURGERY     bilateral breast reduction   CARDIAC ELECTROPHYSIOLOGY STUDY AND ABLATION     CHOLECYSTECTOMY  1990   COLONOSCOPY WITH PROPOFOL N/A 01/04/2017   Procedure: COLONOSCOPY WITH PROPOFOL;  Surgeon: Scot Jun, MD;  Location: [facility]OSCOPY;   Service: Endoscopy;  Laterality: N/A;   CORNEAL TRANSPLANT     ESOPHAGOGASTRODUODENOSCOPY N/A 04/09/2021   Procedure: ESOPHAGOGASTRODUODENOSCOPY (EGD);  Surgeon: Toney Reil, MD;  Location: [facility] ENDOSCOPY;  Service: Gastroenterology;  Laterality: N/A;   ESOPHAGOGASTRODUODENOSCOPY (EGD) WITH PROPOFOL N/A 01/04/2017   Procedure: ESOPHAGOGASTRODUODENOSCOPY (EGD) WITH PROPOFOL;  Surgeon: Scot Jun, MD;  Location: [facility] ENDOSCOPY;  Service: Endoscopy;  Laterality: N/A;   EXCISION BONE CYST Left 07/31/2016   Procedure: EXCISION BONE CYST/exostectomy 28124/left 2nd;  Surgeon: Linus Galas, DPM;  Location: [facility];  Service: Podiatry;  Laterality: Left;   EXTRACORPOREAL SHOCK WAVE LITHOTRIPSY Left 09/12/2015   Procedure: EXTRACORPOREAL SHOCK WAVE LITHOTRIPSY (ESWL);  Surgeon: Vanna Scotland, MD;  Location: [facility] OR[facility];  Service: Urology;  Laterality: Left;   FRACTURE SURGERY     left foot   GUM SURGERY Left    gum infection   HARDWARE REMOVAL Left 11/14/2020   Procedure: HARDWARE REMOVAL;  Surgeon: Kennedy Bucker, MD;  Location: [facility] ORS;  Service: Orthopedics;  Laterality: Left;   HH repair     Fundoplication   JOINT REPLACEMENT Bilateral 2013,2014   total knees   LAPAROSCOPIC HYSTERECTOMY     LITHOTRIPSY     periprosthetic supracondylar fracture of left femur  02/16/2020   [facility]   TONSILLECTOMY  TOTAL KNEE REVISION Right 11/14/2019   Procedure: Revision patella and tibial polyethylene;  Surgeon: Kennedy Bucker, MD;  Location: [facility] ORS;  Service: Orthopedics;  Laterality: Right;   URETEROSCOPY      Allergies: Allergies as of 02/03/2023 - Review Complete 02/03/2023  Allergen Reaction Noted   Librium [chlordiazepoxide] Shortness Of Breath 08/28/2014   Metoclopramide Hives and Other (See Comments) 08/28/2014   Vanilla Shortness Of Breath 09/10/2014   Aspirin Hives 08/28/2014   Nsaids Rash 11/21/2014   Azithromycin Other (See Comments) 11/21/2014   Buprenorphine hcl  Other (See Comments) 09/09/2015   Chlordiazepoxide hcl Other (See Comments) 11/21/2014   Morphine Other (See Comments) 08/28/2014   Sulfa antibiotics  11/21/2014   Tolmetin  09/09/2015   Amlodipine besylate Itching and Rash 05/10/2018   Iron Nausea And Vomiting 11/21/2014    Medications:  Current Outpatient Medications:    Accu-Chek Softclix Lancets lancets, Use as instructed once a daily DX R730.01, Disp: 100 each, Rfl: 12   alendronate (FOSAMAX) 70 MG tablet, Take 1 tablet (70 mg total) by mouth once a week. Take with a full glass of water on an empty stomach., Disp: 12 tablet, Rfl: 3   Blood Glucose Monitoring Suppl (ACCU-CHEK GUIDE ME) w/Device KIT, Use as directed once a day DX R730.01, Disp: 1 kit, Rfl: 0   busPIRone (BUSPAR) 10 MG tablet, Take 1 tablet (10 mg total) by mouth 2 (two) times daily., Disp: 60 tablet, Rfl: 2   clonazePAM (KLONOPIN) 1 MG tablet, Take 0.5-1 tablets (0.5-1 mg total) by mouth 2 (two) times daily as needed for anxiety., Disp: 60 tablet, Rfl: 2   diclofenac Sodium (VOLTAREN) 1 % GEL, Apply 4 g topically 4 (four) times daily., Disp: 350 g, Rfl: 1   diltiazem (CARDIZEM CD) 240 MG 24 hr capsule, TAKE ONE CAPSULE BY MOUTH DAILY, Disp: 90 capsule, Rfl: 3   DULoxetine (CYMBALTA) 60 MG capsule, Take 1 capsule (60 mg total) by mouth at bedtime., Disp: 90 capsule, Rfl: 1   EPINEPHrine 0.3 mg/0.3 mL IJ SOAJ injection, Inject 0.3 mg into the muscle as needed for anaphylaxis., Disp: 1 each, Rfl: 3   fluticasone (FLONASE) 50 MCG/ACT nasal spray, Place 1 spray into both nostrils daily., Disp: 18.2 mL, Rfl: 2   fluticasone-salmeterol (WIXELA INHUB) 250-50 MCG/ACT AEPB, Inhale 1 puff into the lungs in the morning and at bedtime., Disp: 60 each, Rfl: 12   gabapentin (NEURONTIN) 300 MG capsule, TAKE 1 CAPSULE(300 MG) BY MOUTH TWICE DAILY, Disp: 60 capsule, Rfl: 2   gabapentin (NEURONTIN) 600 MG tablet, TAKE 1 TABLET(600 MG) BY MOUTH TWICE DAILY, Disp: 60 tablet, Rfl: 3   glucose  blood (ACCU-CHEK GUIDE) test strip, Use as instructed once daily Dx R73.01, Disp: 100 each, Rfl: 12   ibuprofen (ADVIL) 200 MG tablet, Take 200 mg by mouth every 6 (six) hours as needed., Disp: , Rfl:    Ipratropium-Albuterol (COMBIVENT RESPIMAT) 20-100 MCG/ACT AERS respimat, INHALE 1 PUFF BY MOUTH EVERY 6 HOURS, Disp: 4 g, Rfl: 3   ipratropium-albuterol (DUONEB) 0.5-2.5 (3) MG/3ML SOLN, Take 3 mLs by nebulization 4 (four) times daily., Disp: 360 mL, Rfl: 11   lidocaine (XYLOCAINE) 2 % solution, Use as directed 10 mLs in the mouth or throat every 6 (six) hours as needed for mouth pain., Disp: 400 mL, Rfl: 1   montelukast (SINGULAIR) 10 MG tablet, TAKE 1 TABLET(10 MG) BY MOUTH AT BEDTIME, Disp: 90 tablet, Rfl: 1   nystatin (MYCOSTATIN) 100000 UNIT/ML suspension, Take 5 mLs (500,000  Units total) by mouth 4 (four) times daily., Disp: 120 mL, Rfl: 0   ondansetron (ZOFRAN) 4 MG tablet, TAKE 1 TABLET BY MOUTH TWICE DAILY AS NEEDED, Disp: 45 tablet, Rfl: 1   pantoprazole (PROTONIX) 40 MG tablet, Take 40 mg by mouth 2 (two) times daily., Disp: , Rfl:    pilocarpine (SALAGEN) 5 MG tablet, Take 5 mg by mouth 2 (two) times daily., Disp: , Rfl:    rOPINIRole (REQUIP) 0.5 MG tablet, TAKE 2 TABLETS BY MOUTH TWICE DAILY FOR RESTLESS LEGS, Disp: 360 tablet, Rfl: 1   SUMAtriptan (IMITREX) 100 MG tablet, Take 1 tablet (100 mg total) by mouth every 2 (two) hours as needed (for migraine headaches.). May repeat in 1 hours if headache persists or recurs., Disp: 10 tablet, Rfl: 3   tirzepatide (MOUNJARO) 2.5 MG/0.5ML Pen, Inject 2.5 mg into the skin once a week., Disp: 6 mL, Rfl: 2   traZODone (DESYREL) 150 MG tablet, TAKE 1 TABLET(150 MG) BY MOUTH AT BEDTIME, Disp: 30 tablet, Rfl: 2   HYDROcodone-acetaminophen (NORCO/VICODIN) 5-325 MG tablet, Take 1 tablet by mouth every 12 (twelve) hours as needed for up to 15 days for severe pain (pain score 7-10). (Patient not taking: Reported on 02/03/2023), Disp: 30 tablet, Rfl:  0  Social History: Social History   Tobacco Use   Smoking status: Never    Passive exposure: Past   Smokeless tobacco: Never  Vaping Use   Vaping status: Never Used  Substance Use Topics   Alcohol use: No   Drug use: No    Family Medical History: Family History  Problem Relation Age of Onset   Hypertension Mother    Stroke Mother    Prostate cancer Father    Kidney Stones Father    Diabetes Brother    Hypertension Brother    Breast cancer Maternal Aunt    Kidney disease Neg Hx     Physical Examination: Vitals:   02/03/23 0936  BP: 128/60    General: Patient is in no apparent distress. Attention to examination is appropriate.  Neck:   Supple.  Full range of motion.  Respiratory: Patient is breathing without any difficulty.   NEUROLOGICAL:     Awake, alert, oriented to person, place, and time.  Speech is clear and fluent.   Cranial Nerves: Pupils equal round and reactive to light.  Facial tone is symmetric.  Facial sensation is symmetric. Shoulder shrug is symmetric. Tongue protrusion is midline.    Strength: Giveaway weakness noted throughout the bilateral upper extremities, grip strength decreasing on the left.  Reflexes are 1+ with the exception of the following.   Hoffman's is positive on the left with spreading of her BR and biceps reflexes.      No evidence of dysmetria noted.  Gait has significantly worsened since last seen. Unable to tandem walk, unsteady on regular gait.   Imaging: Narrative & Impression  CLINICAL DATA:  Posterior neck pain running down the left arm with   EXAM: MRI CERVICAL SPINE WITHOUT CONTRAST   TECHNIQUE: Multiplanar, multisequence MR imaging of the cervical spine was performed. No intravenous contrast was administered.   COMPARISON:  Numbness.  Cervical spine MRI 10/29/2009   FINDINGS: Image quality is significantly degraded by motion artifact.   Alignment: Normal.   Vertebrae: Vertebral body heights are  preserved. Background marrow signal is normal. There is no suspicious marrow signal abnormality or marrow edema.   Cord: There is no definite cord signal abnormality, though evaluation is significantly degraded  by motion artifact.   Posterior Fossa, vertebral arteries, paraspinal tissues: The imaged posterior fossa is unremarkable. The vertebral artery flow voids are normal. The paraspinal soft tissues are unremarkable.   Disc levels:   C2-C3: No significant spinal canal or neural foraminal stenosis   C3-C4: There is a posterior disc osteophyte complex with right worse than left uncovertebral ridging and bilateral facet arthropathy with ligamentum flavum thickening resulting in at least moderate spinal canal stenosis with likely mass effect on the cord, and at least moderate right and mild left neural foraminal stenosis.   C4-C5: There is a shallow posterior disc osteophyte complex with probable mild-to-moderate spinal canal stenosis. There is uncovertebral and facet arthropathy, but the neural foramina are not well assessed due to motion artifact.   C5-C6: There is uncovertebral and facet arthropathy with probable moderate spinal canal stenosis, and possibly severe bilateral neural foraminal stenosis.   C6-C7: There is suspected disc bulge contributing to mild spinal canal stenosis and possibly severe bilateral neural foraminal stenosis   C7-T1: No significant spinal canal or neural foraminal stenosis.   IMPRESSION: 1. Image quality is significantly degraded by motion artifact, limiting evaluation of the spinal canal and neural foramina. 2. Within this confine, there is probably at least moderate spinal canal stenosis at C3-C4 and C5-C6, mild-to-moderate spinal canal stenosis at C4-C5, and mild spinal canal stenosis at C6-C7. Cord signal is not well assessed due to artifact. CT myelogram may be considered as indicated given less susceptbility to motion artifact. 3. At  least moderate right and mild left neural foraminal stenosis at C3-C4, and possibly severe bilateral neural foraminal stenosis at C5-C6 and C6-C7.     Electronically Signed   By: Lesia Hausen M.D.   On: 11/18/2022 16:36   Narrative & Impression  CLINICAL DATA:  Chronic neck pain.   EXAM: CERVICAL SPINE - COMPLETE 4+ VIEW   COMPARISON:  11/10/2022.   FINDINGS: Lateral neutral, flexion and extension views as well as AP view were acquired. C6 through T1 are not well visualized on the lateral view due to superimposed soft tissues.   No fracture, bone lesion or spondylolisthesis. Straightened cervical lordosis.   No subluxation with flexion or extension.   Mild loss of disc height throughout the visualized cervical spine. Bilateral facet degenerative changes.   Soft tissues are unremarkable.   IMPRESSION: 1. No fracture or acute finding. 2. No subluxation with flexion or extension. 3. No change from the recent prior exams.     Electronically Signed   By: Amie Portland M.D.   On: 12/09/2022 10:28      I have personally reviewed the images and agree with the above interpretation.  Medical Decision Making/Assessment and Plan: Summer Murphy is a pleasant 67 y.o. female with significant cervicalgia and some radiating pain onto the left upper extremity.  Likely secondary to cervical radiculopathy and cervical spondylosis.  When we saw her last she was presenting mostly with radicular symptoms, and had a injection which did not give her significant improvement.  Unfortunately she has been developing progressive worsening myelopathy with decreased hand function and decreased ambulatory ability.  She has had her previous tooth infection treated.  Given her progressive cervical myelopathy we will plan for a cervical decompression and fusion, she has significant swallowing difficulties at baseline due to Barrett's esophagus and has had a recent dilatation.  Given her anatomical  alignment we will plan for a posterior approach in the setting of swallowing difficulties.  Thank you for involving  me in the care of this patient.   I spent 35 minutes on her care today, including record review, image interpretation, direct patient interaction with extensive counseling on conservative versus interventional therapy, risk discussions, and surgical planning.   Lovenia Kim MD/MSCR Neurosurgery

## 2023-02-04 DIAGNOSIS — M4802 Spinal stenosis, cervical region: Secondary | ICD-10-CM | POA: Insufficient documentation

## 2023-02-04 DIAGNOSIS — G992 Myelopathy in diseases classified elsewhere: Secondary | ICD-10-CM | POA: Insufficient documentation

## 2023-02-05 ENCOUNTER — Telehealth: Payer: Self-pay | Admitting: Neurosurgery

## 2023-02-05 DIAGNOSIS — M5010 Cervical disc disorder with radiculopathy, unspecified cervical region: Secondary | ICD-10-CM

## 2023-02-05 DIAGNOSIS — G992 Myelopathy in diseases classified elsewhere: Secondary | ICD-10-CM

## 2023-02-05 NOTE — Telephone Encounter (Signed)
Patient saw Dr.Smith on 02/03/2023. She has an appt with Dr.Lateef on 02/10/23. She wants to cancel appt with Dr.Lateef and move forward with surgery. Dr. Katrinka Blazing was supposed to discuss her case with someone else. She does not want another injection if she is not going to get any relief.  She is aware it will be Monday before she gets a call back.

## 2023-02-08 ENCOUNTER — Ambulatory Visit: Payer: 59 | Admitting: Neurosurgery

## 2023-02-08 NOTE — Telephone Encounter (Signed)
Patient calling back this morning. She would like to get a response quickly so she can cancel her injection appt for 02/10/23.

## 2023-02-08 NOTE — Telephone Encounter (Signed)
Patient has been notified it is OK to cancel the injection if she wants to and I will be in touch with a surgery date.

## 2023-02-09 NOTE — Telephone Encounter (Signed)
Planned surgery: C3-6 posterior cervical laminectomy and fusion   Surgery date: 03/02/23 at [facility]: [address] [place], [place] [address]) - you will find out your arrival time the business day before your surgery.   Pre-op appointment at [facility] Pre-admit Testing: we will call you with a date/time for this. If you are scheduled for an in person appointment, Pre-admit Testing is located on the first floor of the [facility] building, [address], [address]. Please bring all prescriptions in the original prescription bottles to your appointment. During this appointment, they will advise you which medications you can take the morning of surgery, and which medications you will need to hold for surgery. Labs (such as blood work, EKG) may be done at your pre-op appointment. You are not required to fast for these labs. Should you need to change your pre-op appointment, please call Pre-admit testing at [phone].      Diabetes/weight loss medications:  Mounjaro injections: hold 7 days prior to surgery   Surgical clearance: we will send a clearance form to [name], NP. They may wish to see you in their office prior to signing the clearance form. If so, they may call you to schedule an appointment.     Brace: You will need to bring the brace to the hospital on the day of surgery. [facility] will contact you regarding an appointment for the brace you will use after surgery. If it is getting close to your surgery date and you have not received an appointment with [facility], please reach out to [place].  Their number is [phone] should you miss their call or have an issue with your brace after surgery.     NSAIDS (Non-steroidal anti-inflammatory drugs): because you are having a fusion, please avoid taking any NSAIDS (examples: ibuprofen, motrin, aleve, naproxen, meloxicam, diclofenac) for 3 months after surgery. Celebrex is  an exception and is OK to take, if prescribed. Tylenol is not an NSAID.    Common restrictions after surgery: No bending, lifting, or twisting ("BLT"). Avoid lifting objects heavier than 10 pounds for the first 6 weeks after surgery. Where possible, avoid household activities that involve lifting, bending, reaching, pushing, or pulling such as laundry, vacuuming, grocery shopping, and childcare. Try to arrange for help from friends and family for these activities while you heal. Do not drive while taking prescription pain medication. Weeks 6 through 12 after surgery: avoid lifting more than 25 pounds.    X-rays after surgery: Because you are having a fusion: for appointments after your 2 week follow-up: please arrive at the [facility] outpatient imaging center ([address], [place]) or [facility] one hour prior to your appointment for x-rays. This applies to every appointment after your 2 week follow-up. Failure to do so may result in your appointment being rescheduled.   How to contact us:  If you have any questions/concerns before or after surgery, you can reach [place] at [phone], or you can send a mychart message. We can be reached by phone or mychart 8am-4pm, Monday-Friday.  *Please note: Calls after 4pm are forwarded to a third party answering service. Mychart messages are not routinely monitored during evenings, weekends, and holidays. Please call our office to contact the answering service for urgent concerns during non-business hours.    If you have FMLA/disability paperwork, please drop it off or fax it to [phone], attention Patty.   Appointments/FMLA & disability paperwork: Joycelyn Rua, &  Flonnie Hailstone Registered Nurse/Surgery scheduler: Royston Cowper Medical Assistants: Nash Mantis Physician Assistants: Joan Flores, PA-C, Manning Charity, PA-C & Drake Leach, PA-C Surgeons: Venetia Night, MD & Ernestine Mcmurray, MD

## 2023-02-10 ENCOUNTER — Ambulatory Visit: Payer: 59 | Admitting: Student in an Organized Health Care Education/Training Program

## 2023-02-10 NOTE — Telephone Encounter (Signed)
Called patient  No answer, mailbox full

## 2023-02-11 ENCOUNTER — Other Ambulatory Visit: Payer: Self-pay

## 2023-02-11 DIAGNOSIS — Z01818 Encounter for other preprocedural examination: Secondary | ICD-10-CM

## 2023-02-11 NOTE — Telephone Encounter (Signed)
Spoke with patient- she was unable to read the mychart messages but she would like to continue with surgery date of 12/10. Please give patient a call

## 2023-02-11 NOTE — Telephone Encounter (Signed)
I spoke with the patient to confirm a surgery date of 03/02/23. Post op appointments have been scheduled. I answered her questions to the best of my ability. I have placed a patient information book about the surgery up front for her to pick up at her convenience.

## 2023-02-11 NOTE — Telephone Encounter (Signed)
She is returning your call. [phone]

## 2023-02-12 ENCOUNTER — Telehealth: Payer: Self-pay | Admitting: Nurse Practitioner

## 2023-02-12 NOTE — Telephone Encounter (Signed)
Received surgical clearance from [facility]. Gave to [employer]

## 2023-02-15 ENCOUNTER — Encounter: Payer: Self-pay | Admitting: Nurse Practitioner

## 2023-02-16 ENCOUNTER — Encounter: Payer: Self-pay | Admitting: Nurse Practitioner

## 2023-02-16 ENCOUNTER — Telehealth: Payer: Self-pay | Admitting: Nurse Practitioner

## 2023-02-16 ENCOUNTER — Ambulatory Visit (INDEPENDENT_AMBULATORY_CARE_PROVIDER_SITE_OTHER): Payer: Self-pay | Admitting: Nurse Practitioner

## 2023-02-16 VITALS — BP 126/66 | HR 96 | Temp 98.7°F | Resp 16 | Ht 63.0 in | Wt 134.2 lb

## 2023-02-16 DIAGNOSIS — Z01818 Encounter for other preprocedural examination: Secondary | ICD-10-CM | POA: Diagnosis not present

## 2023-02-16 DIAGNOSIS — E1169 Type 2 diabetes mellitus with other specified complication: Secondary | ICD-10-CM

## 2023-02-16 NOTE — Progress Notes (Signed)
[facility] [address] [place], [place] [address]  Internal MEDICINE  Office Visit Note  Patient Name: Summer Murphy  [id number]  [id number]  Date of Service: 02/16/2023  Chief Complaint  Patient presents with   Depression   Gastroesophageal Reflux   Hypertension   Follow-up    Surgical clearance     HPI Yudi presents for a follow-up visit for pre op clearance.  Pre-operative clearance for C3-6 posterior cervical laminectomy and fusion. Her last A1c is stable. Breathing is controlled with current medications. Last PFT was normal, also sees a separate pulmonologist. Last chest xray was normal except for a large hiatal hernia.  Diabetes -- currently stable, mounjaro on hold for a couple weeks until after she has her back surgery done.    Current Medication: Outpatient Encounter Medications as of 02/16/2023  Medication Sig   Accu-Chek Softclix Lancets lancets Use as instructed once a daily DX R730.01   acetaminophen (TYLENOL) 650 MG CR tablet Take 1,300 mg by mouth every 8 (eight) hours as needed for pain.   alendronate (FOSAMAX) 70 MG tablet Take 1 tablet (70 mg total) by mouth once a week. Take with a full glass of water on an empty stomach.   Blood Glucose Monitoring Suppl (ACCU-CHEK GUIDE ME) w/Device KIT Use as directed once a day DX R730.01   busPIRone (BUSPAR) 10 MG tablet Take 1 tablet (10 mg total) by mouth 2 (two) times daily.   clonazePAM (KLONOPIN) 1 MG tablet Take 0.5-1 tablets (0.5-1 mg total) by mouth 2 (two) times daily as needed for anxiety.   diclofenac Sodium (VOLTAREN) 1 % GEL Apply 4 g topically 4 (four) times daily. (Patient taking differently: Apply 4 g topically 4 (four) times daily as needed (pain).)   diltiazem (CARDIZEM CD) 240 MG 24 hr capsule TAKE ONE CAPSULE BY MOUTH DAILY   DULoxetine (CYMBALTA) 60 MG capsule Take 1 capsule (60 mg total) by mouth at bedtime.   EPINEPHrine 0.3 mg/0.3 mL IJ SOAJ injection Inject 0.3 mg into the  muscle as needed for anaphylaxis.   fluticasone (FLONASE) 50 MCG/ACT nasal spray Place 1 spray into both nostrils daily. (Patient taking differently: Place 1 spray into both nostrils daily as needed for allergies.)   fluticasone-salmeterol (WIXELA INHUB) 250-50 MCG/ACT AEPB Inhale 1 puff into the lungs in the morning and at bedtime.   gabapentin (NEURONTIN) 300 MG capsule TAKE 1 CAPSULE(300 MG) BY MOUTH TWICE DAILY   gabapentin (NEURONTIN) 600 MG tablet TAKE 1 TABLET(600 MG) BY MOUTH TWICE DAILY   glucose blood (ACCU-CHEK GUIDE) test strip Use as instructed once daily Dx R73.01   HYDROcodone-acetaminophen (NORCO/VICODIN) 5-325 MG tablet Take 1 tablet by mouth every 12 (twelve) hours as needed for moderate pain (pain score 4-6).   Ipratropium-Albuterol (COMBIVENT RESPIMAT) 20-100 MCG/ACT AERS respimat INHALE 1 PUFF BY MOUTH EVERY 6 HOURS (Patient taking differently: Inhale 1 puff into the lungs every 6 (six) hours as needed for wheezing or shortness of breath.)   ipratropium-albuterol (DUONEB) 0.5-2.5 (3) MG/3ML SOLN Take 3 mLs by nebulization 4 (four) times daily. (Patient taking differently: Take 3 mLs by nebulization every 6 (six) hours as needed (SOB/Wheezing).)   lidocaine (XYLOCAINE) 2 % solution Use as directed 10 mLs in the mouth or throat every 6 (six) hours as needed for mouth pain.   montelukast (SINGULAIR) 10 MG tablet TAKE 1 TABLET(10 MG) BY MOUTH AT BEDTIME   nystatin (MYCOSTATIN) 100000 UNIT/ML suspension Take 5 mLs (500,000 Units total) by mouth 4 (  four) times daily.   ondansetron (ZOFRAN) 4 MG tablet TAKE 1 TABLET BY MOUTH TWICE DAILY AS NEEDED   pantoprazole (PROTONIX) 40 MG tablet Take 40 mg by mouth 2 (two) times daily.   pilocarpine (SALAGEN) 5 MG tablet Take 5 mg by mouth 2 (two) times daily.   rOPINIRole (REQUIP) 0.5 MG tablet TAKE 2 TABLETS BY MOUTH TWICE DAILY FOR RESTLESS LEGS   SUMAtriptan (IMITREX) 100 MG tablet Take 1 tablet (100 mg total) by mouth every 2 (two) hours as  needed (for migraine headaches.). May repeat in 1 hours if headache persists or recurs.   tirzepatide [facility]) 2.5 MG/0.5ML Pen Inject 2.5 mg into the skin once a week.   traZODone (DESYREL) 150 MG tablet TAKE 1 TABLET(150 MG) BY MOUTH AT BEDTIME   No facility-administered encounter medications on file as of 02/16/2023.    Surgical History: Past Surgical History:  Procedure Laterality Date   ABDOMINAL HYSTERECTOMY     AMPUTATION TOE Left 07/31/2016   Procedure: AMPUTATION TOE/MPJ 2nd toe;  Surgeon: Linus Galas, DPM;  Location: [facility] ORS;  Service: Podiatry;  Laterality: Left;   APPENDECTOMY  1990   BACK SURGERY     low back   BREAST SURGERY     bilateral breast reduction   CARDIAC ELECTROPHYSIOLOGY STUDY AND ABLATION     CHOLECYSTECTOMY  1990   COLONOSCOPY WITH PROPOFOL N/A 01/04/2017   Procedure: COLONOSCOPY WITH PROPOFOL;  Surgeon: Scot Jun, MD;  Location: [facility] ENDOSCOPY;  Service: Endoscopy;  Laterality: N/A;   CORNEAL TRANSPLANT     ESOPHAGOGASTRODUODENOSCOPY N/A 04/09/2021   Procedure: ESOPHAGOGASTRODUODENOSCOPY (EGD);  Surgeon: Toney Reil, MD;  Location: [facility] ENDOSCOPY;  Service: Gastroenterology;  Laterality: N/A;   ESOPHAGOGASTRODUODENOSCOPY (EGD) WITH PROPOFOL N/A 01/04/2017   Procedure: ESOPHAGOGASTRODUODENOSCOPY (EGD) WITH PROPOFOL;  Surgeon: Scot Jun, MD;  Location: [facility] ENDOSCOPY;  Service: Endoscopy;  Laterality: N/A;   EXCISION BONE CYST Left 07/31/2016   Procedure: EXCISION BONE CYST/exostectomy 28124/left 2nd;  Surgeon: Linus Galas, DPM;  Location: [facility] ORS;  Service: Podiatry;  Laterality: Left;   EXTRACORPOREAL SHOCK WAVE LITHOTRIPSY Left 09/12/2015   Procedure: EXTRACORPOREAL SHOCK WAVE LITHOTRIPSY (ESWL);  Surgeon: Vanna Scotland, MD;  Location: [facility] ORS;  Service: Urology;  Laterality: Left;   FRACTURE SURGERY     left foot   GUM SURGERY Left    gum infection   HARDWARE REMOVAL Left 11/14/2020   Procedure: HARDWARE REMOVAL;   Surgeon: Kennedy Bucker, MD;  Location: [facility] ORS;  Service: Orthopedics;  Laterality: Left;   HH repair     Fundoplication   JOINT REPLACEMENT Bilateral 2013,2014   total knees   LAPAROSCOPIC HYSTERECTOMY     LITHOTRIPSY     periprosthetic supracondylar fracture of left femur  02/16/2020   [facility]   TONSILLECTOMY     TOTAL KNEE REVISION Right 11/14/2019   Procedure: Revision patella and tibial polyethylene;  Surgeon: Kennedy Bucker, MD;  Location: [facility] ORS;  Service: Orthopedics;  Laterality: Right;   URETEROSCOPY      Medical History: Past Medical History:  Diagnosis Date   Acid reflux    Anemia    Anxiety    Arrhythmia    treated with meds and has no current problems   Arthritis    most uncomfortable in knees   Asthma    uses several inhalers   Depression    Fever blister    Hematuria    History of kidney stones    Hypertension    Hypoglycemia  Left flank pain    Migraine    Pre-diabetes    Restless leg    Yeast vaginitis     Family History: Family History  Problem Relation Age of Onset   Hypertension Mother    Stroke Mother    Prostate cancer Father    Kidney Stones Father    Diabetes Brother    Hypertension Brother    Breast cancer Maternal Aunt    Kidney disease Neg Hx     Social History   Socioeconomic History   Marital status: Widowed    Spouse name: Not on file   Number of children: Not on file   Years of education: Not on file   Highest education level: Not on file  Occupational History   Not on file  Tobacco Use   Smoking status: Never    Passive exposure: Past   Smokeless tobacco: Never  Vaping Use   Vaping status: Never Used  Substance and Sexual Activity   Alcohol use: No   Drug use: No   Sexual activity: Not on file  Other Topics Concern   Not on file  Social History Narrative   Not on file   Social Determinants of Health   Financial Resource Strain: Not on file  Food Insecurity: No Food Insecurity (06/02/2022)    Hunger Vital Sign    Worried About Running Out of Food in the Last Year: Never true    Ran Out of Food in the Last Year: Never true  Transportation Needs: No Transportation Needs (06/02/2022)   PRAPARE - Administrator, Civil Service (Medical): No    Lack of Transportation (Non-Medical): No  Physical Activity: Not on file  Stress: Not on file  Social Connections: Not on file  Intimate Partner Violence: Not At Risk (06/02/2022)   Humiliation, Afraid, Rape, and Kick questionnaire    Fear of Current or Ex-Partner: No    Emotionally Abused: No    Physically Abused: No    Sexually Abused: No      Review of Systems  Constitutional:  Negative for fatigue and fever.  HENT:  Negative for congestion, mouth sores and postnasal drip.   Respiratory:  Negative for cough.   Cardiovascular:  Negative for chest pain.  Genitourinary:  Negative for flank pain.  Psychiatric/Behavioral: Negative.      Vital Signs: BP 126/66   Pulse (!) 102   Temp 98.7 F (37.1 C)   Resp 16   Ht 5\' 3"  (1.6 m)   Wt 134 lb 3.2 oz (60.9 kg)   SpO2 95%   BMI 23.77 kg/m    Physical Exam Vitals reviewed.  Constitutional:      Appearance: Normal appearance.  HENT:     Head: Normocephalic and atraumatic.     Nose: Nose normal.     Mouth/Throat:     Mouth: Mucous membranes are moist.     Pharynx: No posterior oropharyngeal erythema.  Eyes:     Extraocular Movements: Extraocular movements intact.     Pupils: Pupils are equal, round, and reactive to light.  Cardiovascular:     Pulses: Normal pulses.     Heart sounds: Normal heart sounds.  Pulmonary:     Effort: Pulmonary effort is normal.     Breath sounds: Normal breath sounds.  Neurological:     General: No focal deficit present.     Mental Status: She is alert.  Psychiatric:        Mood and Affect: Mood  normal.        Behavior: Behavior normal.        Assessment/Plan: 1. Type 2 diabetes mellitus with other specified complication,  without long-term current use of insulin (HCC) Stable, restart mounjaro after surgery  2. Preoperative clearance Cleared, form signed and faxed   General Counseling: Makaia verbalizes understanding of the findings of todays visit and agrees with plan of treatment. I have discussed any further diagnostic evaluation that may be needed or ordered today. We also reviewed her medications today. she has been encouraged to call the office with any questions or concerns that should arise related to todays visit.    No orders of the defined types were placed in this encounter.   No orders of the defined types were placed in this encounter.   Return if symptoms worsen or fail to improve, for keep regular scheduled follow up.   Total time spent:20 Minutes Time spent includes review of chart, medications, test results, and follow up plan with the patient.   [place] Controlled Substance Database was reviewed by me.  This patient was seen by [name], FNP-C in collaboration with Dr. Beverely Risen as a part of collaborative care agreement.   Toriano Aikey R. Tedd Sias, MSN, FNP-C Internal medicine

## 2023-02-16 NOTE — Telephone Encounter (Signed)
Surgical clearance faxed back to [facility]; [phone]. Scanned-Toni

## 2023-02-17 ENCOUNTER — Emergency Department
Admission: EM | Admit: 2023-02-17 | Discharge: 2023-02-17 | Disposition: A | Discharge status: Home / Self Care | Payer: 59 | Attending: Emergency Medicine | Admitting: Emergency Medicine

## 2023-02-17 ENCOUNTER — Other Ambulatory Visit: Payer: Self-pay

## 2023-02-17 DIAGNOSIS — M542 Cervicalgia: Secondary | ICD-10-CM | POA: Diagnosis present

## 2023-02-17 DIAGNOSIS — M5412 Radiculopathy, cervical region: Secondary | ICD-10-CM | POA: Diagnosis not present

## 2023-02-17 MED ORDER — OXYCODONE-ACETAMINOPHEN 5-325 MG PO TABS: 1.0000 | ORAL_TABLET | Freq: Three times a day (TID) | ORAL | 0 refills | Status: AC | PRN

## 2023-02-17 MED ORDER — KETOROLAC TROMETHAMINE 60 MG/2ML IM SOLN
30.0000 mg | Freq: Once | INTRAMUSCULAR | Status: AC
  Administered 2023-02-17: 30 mg via INTRAMUSCULAR
  Filled 2023-02-17: qty 2

## 2023-02-17 MED ORDER — OXYCODONE-ACETAMINOPHEN 5-325 MG PO TABS
1.0000 | ORAL_TABLET | Freq: Once | ORAL | Status: AC
  Administered 2023-02-17: 1 via ORAL
  Filled 2023-02-17: qty 1

## 2023-02-17 MED ORDER — METHOCARBAMOL 500 MG PO TABS: 500.0000 mg | ORAL_TABLET | Freq: Three times a day (TID) | ORAL | 0 refills | Status: AC | PRN

## 2023-02-17 NOTE — ED Triage Notes (Signed)
Pt reports neck pain. States needs surgery to fuse cervical disc. Surgery is scheduled 12/10. Reports was sent here by surgeon for pain control for the night. Denies any new trauma or injury. Denies new associated symptoms. Denies relief with home prescription of hydrocodone. Last dose at 0900 this AM. Pt ambulatory to triage. Alert and oriented. Breathing unlabored speaking in full sentences.

## 2023-02-17 NOTE — ED Notes (Signed)
ED Provider at bedside discussing discharge plan with pt. Family member present to drive pt home.

## 2023-02-17 NOTE — Discharge Instructions (Addendum)
Take the prescription meds as directed. Follow-up with your Neurosurgeon as scheduled. Return to the ED as needed.

## 2023-02-20 NOTE — ED Provider Notes (Signed)
[facility] Emergency Department Provider Note     Event Date/Time   First MD Initiated Contact with Patient 02/17/23 2104     (approximate)   History   Neck Pain   HPI  Summer Murphy is a 67 y.o. female with a history of severe degenerative disease of the cervical spine, presents to the ED ahead of her scheduled cervical fusion surgery which is set for 03/02/2023.  Patient was sent by her neurosurgeon for pain control.  Patient denies any injury, trauma, or fall patient denies any new paresthesias, weakness, paralysis.  No reports of any chest pain or shortness of breath.  Patient denies significant benefit upper chronic pain with her previously prescribed hydrocodone.  She is presenting with acute on chronic pain exacerbation.  Physical Exam   Triage Vital Signs: ED Triage Vitals  Encounter Vitals Group     BP 02/17/23 1942 (!) 173/151     Systolic BP Percentile --      Diastolic BP Percentile --      Pulse Rate 02/17/23 1942 94     Resp 02/17/23 1942 18     Temp 02/17/23 1942 98.6 F (37 C)     Temp Source 02/17/23 1942 Oral     SpO2 02/17/23 1942 95 %     Weight --      Height --      Head Circumference --      Peak Flow --      Pain Score 02/17/23 1948 10     Pain Loc --      Pain Education --      Exclude from Growth Chart --     Most recent vital signs: Vitals:   02/17/23 1942 02/17/23 2125  BP: (!) 173/151 127/66  Pulse: 94 87  Resp: 18 14  Temp: 98.6 F (37 C) 98.9 F (37.2 C)  SpO2: 95% 97%    General Awake, no distress. NAD HEENT NCAT. PERRL. EOMI. No rhinorrhea. Mucous membranes are moist.  CV:  Good peripheral perfusion.  RESP:  Normal effort.  ABD:  No distention.  MSK:  Spinal alignment without midline tenderness, spasm, deformity, or step-off.  Patient with active range of motion of the cervical spine without difficulty.  Normal composite fist bilaterally. NEURO: Cranial nerves 2-12 grossly intact.   ED  Results / Procedures / Treatments   Labs (all labs ordered are listed, but only abnormal results are displayed) Labs Reviewed - No data to display   EKG    RADIOLOGY  No results found.   PROCEDURES:  Critical Care performed: No  Procedures   MEDICATIONS ORDERED IN ED: Medications  oxyCODONE-acetaminophen (PERCOCET/ROXICET) 5-325 MG per tablet 1 tablet (1 tablet Oral Given 02/17/23 2034)  ketorolac (TORADOL) injection 30 mg (30 mg Intramuscular Given 02/17/23 2034)     IMPRESSION / MDM / ASSESSMENT AND PLAN / ED COURSE  I reviewed the triage vital signs and the nursing notes.                              Differential diagnosis includes, but is not limited to, acute on chronic pain exacerbation, radiculopathy, cervical strain, cervical fracture  Patient's presentation is most consistent with acute, uncomplicated illness.  Patient's diagnosis is consistent with acute on chronic pain secondary to cervical radiculopathy.  Patient presents for pain management at the advice of her neurosurgeon.  No red flags on exam.  No reports of any recent injury, trauma, or fall.  Patient notes improvement of symptoms after medication administration from the lobby including Percocet and Toradol.  Patient will be discharged home with prescriptions for methocarbamol and oxycodone. Patient is to follow up with neurosurgeon as scheduled, as needed or otherwise directed. Patient is given ED precautions to return to the ED for any worsening or new symptoms.   FINAL CLINICAL IMPRESSION(S) / ED DIAGNOSES   Final diagnoses:  Cervical radiculopathy     Rx / DC Orders   ED Discharge Orders          Ordered    methocarbamol (ROBAXIN) 500 MG tablet  Every 8 hours PRN        02/17/23 2121    oxyCODONE-acetaminophen (PERCOCET) 5-325 MG tablet  Every 8 hours PRN        02/17/23 2121             Note:  This document was prepared using Dragon voice recognition software and may include  unintentional dictation errors.    Lissa Hoard, PA-C 02/20/23 2210    Janith Lima, MD 02/21/23 [phone]

## 2023-02-22 ENCOUNTER — Encounter: Payer: Self-pay | Admitting: Neurosurgery

## 2023-02-22 ENCOUNTER — Other Ambulatory Visit: Payer: Self-pay

## 2023-02-22 ENCOUNTER — Encounter
Admission: RE | Admit: 2023-02-22 | Discharge: 2023-02-22 | Disposition: A | Discharge status: Home / Self Care | Payer: 59 | Source: Ambulatory Visit | Attending: Neurosurgery | Admitting: Neurosurgery

## 2023-02-22 VITALS — BP 120/62 | HR 85 | Temp 98.1°F | Resp 18 | Ht 63.0 in

## 2023-02-22 DIAGNOSIS — Z01818 Encounter for other preprocedural examination: Secondary | ICD-10-CM | POA: Insufficient documentation

## 2023-02-22 DIAGNOSIS — Z01812 Encounter for preprocedural laboratory examination: Secondary | ICD-10-CM

## 2023-02-22 DIAGNOSIS — Z01810 Encounter for preprocedural cardiovascular examination: Secondary | ICD-10-CM | POA: Diagnosis not present

## 2023-02-22 DIAGNOSIS — J301 Allergic rhinitis due to pollen: Secondary | ICD-10-CM | POA: Insufficient documentation

## 2023-02-22 DIAGNOSIS — I459 Conduction disorder, unspecified: Secondary | ICD-10-CM | POA: Diagnosis not present

## 2023-02-22 DIAGNOSIS — M4802 Spinal stenosis, cervical region: Secondary | ICD-10-CM | POA: Diagnosis not present

## 2023-02-22 DIAGNOSIS — I10 Essential (primary) hypertension: Secondary | ICD-10-CM | POA: Diagnosis not present

## 2023-02-22 DIAGNOSIS — E1165 Type 2 diabetes mellitus with hyperglycemia: Secondary | ICD-10-CM | POA: Diagnosis not present

## 2023-02-22 DIAGNOSIS — G992 Myelopathy in diseases classified elsewhere: Secondary | ICD-10-CM | POA: Insufficient documentation

## 2023-02-22 HISTORY — DX: Chronic obstructive pulmonary disease, unspecified: J44.9

## 2023-02-22 LAB — BASIC METABOLIC PANEL
Anion gap: 9 (ref 5–15)
BUN: 13 mg/dL (ref 8–23)
CO2: 27 mmol/L (ref 22–32)
Calcium: 8.8 mg/dL — ABNORMAL LOW (ref 8.9–10.3)
Chloride: 101 mmol/L (ref 98–111)
Creatinine, Ser: 0.6 mg/dL (ref 0.44–1.00)
GFR, Estimated: >60 mL/min (ref >60)
Glucose, Bld: 114 mg/dL — ABNORMAL HIGH (ref 70–99)
Potassium: 3.8 mmol/L (ref 3.5–5.1)
Sodium: 137 mmol/L (ref 135–145)

## 2023-02-22 LAB — CBC
HCT: 30.7 % — ABNORMAL LOW (ref 36.0–46.0)
Hemoglobin: 9.1 g/dL — ABNORMAL LOW (ref 12.0–15.0)
MCH: 21.8 pg — ABNORMAL LOW (ref 26.0–34.0)
MCHC: 29.6 g/dL — ABNORMAL LOW (ref 30.0–36.0)
MCV: 73.6 fL — ABNORMAL LOW (ref 80.0–100.0)
Platelets: 328 10*3/uL (ref 150–400)
RBC: 4.17 MIL/uL (ref 3.87–5.11)
RDW: 17.5 % — ABNORMAL HIGH (ref 11.5–15.5)
WBC: 6.5 10*3/uL (ref 4.0–10.5)
nRBC: 0 % (ref 0.0–0.2)

## 2023-02-22 LAB — SURGICAL PCR SCREEN
MRSA, PCR: NEGATIVE
Staphylococcus aureus: POSITIVE — AB

## 2023-02-22 NOTE — Patient Instructions (Addendum)
Your procedure is scheduled on: Tuesday, 03/02/2023  Report to the Registration Desk on the 1st floor of the [facility]. To find out your arrival time, please call [phone] between 1PM - 3PM on: Monday, 03/01/2023 If your arrival time is 6:00 am, do not arrive before that time as the [facility] entrance doors do not open until 6:00 am.  REMEMBER: Instructions that are not followed completely may result in serious medical risk, up to and including death; or upon the discretion of your surgeon and anesthesiologist your surgery may need to be rescheduled.  Do not eat food after midnight the night before surgery.  No gum chewing or hard candies.  You may however, drink CLEAR liquids up to 2 hours before you are scheduled to arrive for your surgery. Do not drink anything within 2 hours of your scheduled arrival time.  Clear liquids include: - water    **Type 1 and Type 2 diabetics should only drink water.**   One week prior to surgery:  Stop ANY OVER THE COUNTER supplements until after surgery.  You may however, continue to take Tylenol if needed for pain up until the day of surgery.   Do not take tirzepatide [facility]) 7 days prior to surgery as this is contraindicated with anesthesia. Last dose should be Monday, 02/22/2023 .  **Follow guidelines for insulin and diabetes medications.**  **Follow recommendations regarding stopping blood thinners.**  Continue taking all of your other prescription medications up until the day of surgery.  ON THE DAY OF SURGERY ONLY TAKE THESE MEDICATIONS WITH SIPS OF WATER:  busPIRone (BUSPAR) clonazePAM (KLONOPIN)  gabapentin (NEURONTIN) 4. pantoprazole (PROTONIX)  Use inhalers on the day of surgery and bring the rescue inhaler  to the hospital.  No Alcohol for 24 hours before or after surgery.  No Smoking including e-cigarettes for 24 hours before surgery.  No chewable tobacco products for at least 6 hours before surgery.  No  nicotine patches on the day of surgery.  Do not use any "recreational" drugs for at least a week (preferably 2 weeks) before your surgery.  Please be advised that the combination of cocaine and anesthesia may have negative outcomes, up to and including death. If you test positive for cocaine, your surgery will be cancelled.  On the morning of surgery brush your teeth with toothpaste and water, you may rinse your mouth with mouthwash if you wish. Do not swallow any toothpaste or mouthwash.  Use CHG Soap or wipes as directed on instruction sheet.-provided for you  Do not wear jewelry, make-up, hairpins, clips or nail polish.  For welded (permanent) jewelry: bracelets, anklets, waist bands, etc.  Please have this removed prior to surgery.  If it is not removed, there is a chance that hospital personnel will need to cut it off on the day of surgery.  Do not wear lotions, powders, or perfumes.   Do not shave body hair from the neck down 48 hours before surgery.  Contact lenses, hearing aids and dentures may not be worn into surgery.  Do not bring valuables to the hospital. [facility] is not responsible for any missing/lost belongings or valuables.    Notify your doctor if there is any change in your medical condition (cold, fever, infection).  Wear comfortable clothing (specific to your surgery type) to the hospital.  After surgery, you can help prevent lung complications by doing breathing exercises.  Take deep breaths and cough every 1-2 hours. Your doctor may order a device called  an Facilities manager to help you take deep breaths.  If you are being admitted to the hospital overnight, leave your suitcase in the car. After surgery it may be brought to your room.   If you are being discharged the day of surgery, you will not be allowed to drive home. You will need a responsible individual to drive you home and stay with you for 24 hours after surgery.    Please call the  Pre-admissions Testing Dept. at [phone] if you have any questions about these instructions.  Surgery Visitation Policy:  Patients having surgery or a procedure may have two visitors.  Children under the age of 50 must have an adult with them who is not the patient.  Inpatient Visitation:    Visiting hours are 7 a.m. to 8 p.m. Up to four visitors are allowed at one time in a patient room. The visitors may rotate out with other people during the day.  One visitor age 42 or older may stay with the patient overnight and must be in the room by 8 p.m.     Pre-operative 5 CHG Bath Instructions   You can play a key role in reducing the risk of infection after surgery. Your skin needs to be as free of germs as possible. You can reduce the number of germs on your skin by washing with CHG (chlorhexidine gluconate) soap before surgery. CHG is an antiseptic soap that kills germs and continues to kill germs even after washing.   DO NOT use if you have an allergy to chlorhexidine/CHG or antibacterial soaps. If your skin becomes reddened or irritated, stop using the CHG and notify one of our RNs at [phone].   Please shower with the CHG soap starting 4 days before surgery using the following schedule:         Please keep in mind the following:  DO NOT shave, including legs and underarms, starting the day of your first shower.   You may shave your face at any point before/day of surgery.  Place clean sheets on your bed the day you start using CHG soap. Use a clean washcloth (not used since being washed) for each shower. DO NOT sleep with pets once you start using the CHG.   CHG Shower Instructions:  If you choose to wash your hair and private area, wash first with your normal shampoo/soap.  After you use shampoo/soap, rinse your hair and body thoroughly to remove shampoo/soap residue.  Turn the water OFF and apply about 3 tablespoons (45 ml) of CHG soap to a CLEAN washcloth.  Apply  CHG soap ONLY FROM YOUR NECK DOWN TO YOUR TOES (washing for 3-5 minutes)  DO NOT use CHG soap on face, private areas, open wounds, or sores.  Pay special attention to the area where your surgery is being performed.  If you are having back surgery, having someone wash your back for you may be helpful. Wait 2 minutes after CHG soap is applied, then you may rinse off the CHG soap.  Pat dry with a clean towel  Put on clean clothes/pajamas   If you choose to wear lotion, please use ONLY the CHG-compatible lotions on the back of this paper.     Additional instructions for the day of surgery: DO NOT APPLY any lotions, deodorants, cologne, or perfumes.   Put on clean/comfortable clothes.  Brush your teeth.  Ask your nurse before applying any prescription medications to the skin.      CHG  Compatible Lotions   Aveeno Moisturizing lotion  Cetaphil Moisturizing Cream  Cetaphil Moisturizing Lotion  Clairol Herbal Essence Moisturizing Lotion, Dry Skin  Clairol Herbal Essence Moisturizing Lotion, Extra Dry Skin  Clairol Herbal Essence Moisturizing Lotion, Normal Skin  Curel Age Defying Therapeutic Moisturizing Lotion with Alpha Hydroxy  Curel Extreme Care Body Lotion  Curel Soothing Hands Moisturizing Hand Lotion  Curel Therapeutic Moisturizing Cream, Fragrance-Free  Curel Therapeutic Moisturizing Lotion, Fragrance-Free  Curel Therapeutic Moisturizing Lotion, Original Formula  Eucerin Daily Replenishing Lotion  Eucerin Dry Skin Therapy Plus Alpha Hydroxy Crme  Eucerin Dry Skin Therapy Plus Alpha Hydroxy Lotion  Eucerin Original Crme  Eucerin Original Lotion  Eucerin Plus Crme Eucerin Plus Lotion  Eucerin TriLipid Replenishing Lotion  Keri Anti-Bacterial Hand Lotion  Keri Deep Conditioning Original Lotion Dry Skin Formula Softly Scented  Keri Deep Conditioning Original Lotion, Fragrance Free Sensitive Skin Formula  Keri Lotion Fast Absorbing Fragrance Free Sensitive Skin Formula  Keri  Lotion Fast Absorbing Softly Scented Dry Skin Formula  Keri Original Lotion  Keri Skin Renewal Lotion Keri Silky Smooth Lotion  Keri Silky Smooth Sensitive Skin Lotion  Nivea Body Creamy Conditioning Oil  Nivea Body Extra Enriched Teacher, adult education Moisturizing Lotion Nivea Crme  Nivea Skin Firming Lotion  NutraDerm 30 Skin Lotion  NutraDerm Skin Lotion  NutraDerm Therapeutic Skin Cream  NutraDerm Therapeutic Skin Lotion  ProShield Protective Hand Cream  Provon moisturizing lotion

## 2023-02-23 ENCOUNTER — Encounter: Payer: Self-pay | Admitting: Neurosurgery

## 2023-02-23 LAB — IRON AND TIBC
Iron: 20 ug/dL — ABNORMAL LOW (ref 28–170)
Saturation Ratios: 4 % — ABNORMAL LOW (ref 10.4–31.8)
TIBC: 476 ug/dL — ABNORMAL HIGH (ref 250–450)
UIBC: 456 ug/dL

## 2023-02-23 LAB — FERRITIN: Ferritin: 8 ng/mL — ABNORMAL LOW (ref 11–307)

## 2023-02-23 NOTE — Progress Notes (Signed)
Perioperative Services Pre-Admission/Anesthesia Testing    Date: 02/23/23  Name: Summer Murphy MRN:   [id number]  Re: Preoperative anemia  Planned Surgical Procedure(s):    Case: [id number] Date/Time: 03/02/23 0715   Procedure: C3-6 POSTERIOR CERVICAL LAMINECTOMY AND FUSION   Anesthesia type: General   Pre-op diagnosis:      stenosis of cervical spine with myelopathy      Cervical disc disorder with radiculopathy of cervical region   Location: [facility] OR ROOM 03 / [facility]   Surgeons: Lovenia Kim, MD   Communication Sent To:  Dr. Ernestine Mcmurray, MD (Neurosurgery) Dr. Rickard Patience, MD  (Hematology) Dr. Beverely Risen (Internal/Family Medicine) [name], FNP-C (Internal/Family Medicine)  Clinical Notes:  Patient is scheduled for the above procedure on 03/02/2023 with Dr. Ernestine Mcmurray, MD (neurosurgery).   In preparation for her procedure, patient presented to the PAT clinic on 02/22/2023 for preoperative labs and ECG.  In review of her preoperative labs, patient noted to be anemic with a hemoglobin of 9.1 g/dL and hematocrit of 13.0%.  Consistent with her known iron deficiency, her RBC are microcytic (MCV 73.6) and hypochromic (MCH 21.8).  Labs further reviewed from 09/30/2022, at which time patient had a hemoglobin of 11.2 g/dL, hematocrit 86.5%, MCV 84.6, and MCH 27.5.  Iron was 71 micrograms/dL, TIBC 784 micrograms/dL and iron saturation was 15%.  B12, B9, and TSH levels were normal at that time. These labs were obtained by her PCP.  I do not see where a ferritin level was obtained back in July. Of note, patient has been seen by hematology in the past for iron infusion as she is intolerant of oral iron due to associated side effects. Last ferritin that I see on file was done in 01/2022, at which time it was 28 ng/mL.  Our iron studies did not result until around 0200 this morning. I sent patient a message in MyChart to determine whether or not she was  experiencing any symptoms at this time; awaiting follow up reply. Ideally, I would like to see her hemoglobin better optimized before surgery. While she is at an acceptable level for surgery, optimization of her anemia stands to mitigate perioperative complications and improve her postoperative recovery. Message sent to hematologist Cathie Hoops, MD) to solicit her thoughts on recent labs and to see if it would be feasible to get patient in for at least 1, perhaps 2, Venofer infusions prior to her cervical fusion next week.   Impression and Plan:  Summer Murphy found to be anemic on her preoperative labs. She has dropped her hemoglobin 2 points over the course of the last several months; currently at 9.1 g/dL. I ideally, I would like to see her anemia better optimized prior to surgery, however her current hemoglobin is not prohibitive. Optimization stands to mitigate perioperative complications associated with her anemia. Additionally, optimization of her anemia will promote better postoperative outcomes by increasing her energy levels, which in turn, will allow for earlier mobility and ability to actively participate in any physical therapy and/or exercises prescribe/recommended by Dr. Katrinka Blazing (neurosurgery).   Again, I have reached out to patient to determine whether or not she is experiencing symptoms at this point. Will plan on updating note as further information becomes available. Patient was advised that someone from the cancer center, or myself, would reach out to her regarding plans for potential preoperative anemic optimization once Dr. Cathie Hoops (hematology) has been appropriately consulted.   Quentin Mulling, MSN, APRN, FNP-C,  [facility]  Perioperative Services Nurse Practitioner Phone: [phone] Fax: [phone] 02/23/23 7:03 AM  NOTE: This note has been prepared using Dragon dictation software. Despite my best ability to proofread, there is always the potential that  unintentional transcriptional errors may still occur from this process.

## 2023-02-26 ENCOUNTER — Inpatient Hospital Stay: Payer: 59

## 2023-02-26 ENCOUNTER — Encounter: Payer: Self-pay | Admitting: Oncology

## 2023-02-26 ENCOUNTER — Inpatient Hospital Stay: Payer: 59 | Attending: Oncology | Admitting: Oncology

## 2023-02-26 VITALS — BP 119/95 | HR 100 | Temp 97.2°F | Resp 18 | Wt 135.6 lb

## 2023-02-26 VITALS — BP 98/56 | HR 73 | Resp 16

## 2023-02-26 DIAGNOSIS — D509 Iron deficiency anemia, unspecified: Secondary | ICD-10-CM

## 2023-02-26 DIAGNOSIS — Z8719 Personal history of other diseases of the digestive system: Secondary | ICD-10-CM | POA: Diagnosis not present

## 2023-02-26 DIAGNOSIS — D500 Iron deficiency anemia secondary to blood loss (chronic): Secondary | ICD-10-CM

## 2023-02-26 DIAGNOSIS — K5900 Constipation, unspecified: Secondary | ICD-10-CM | POA: Insufficient documentation

## 2023-02-26 DIAGNOSIS — Z803 Family history of malignant neoplasm of breast: Secondary | ICD-10-CM | POA: Insufficient documentation

## 2023-02-26 DIAGNOSIS — R5383 Other fatigue: Secondary | ICD-10-CM | POA: Insufficient documentation

## 2023-02-26 MED ORDER — IRON SUCROSE 20 MG/ML IV SOLN
200.0000 mg | Freq: Once | INTRAVENOUS | Status: AC
  Administered 2023-02-26: 200 mg via INTRAVENOUS
  Filled 2023-02-26: qty 10

## 2023-02-26 MED ORDER — DIPHENHYDRAMINE HCL 50 MG/ML IJ SOLN
25.0000 mg | Freq: Once | INTRAMUSCULAR | Status: AC
  Administered 2023-02-26: 25 mg via INTRAVENOUS
  Filled 2023-02-26: qty 1

## 2023-02-26 NOTE — Assessment & Plan Note (Signed)
#  Iron deficiency anemia, Labs reviewed and discussed with patient. Lab Results  Component Value Date   HGB 9.1 (L) 02/22/2023   TIBC 476 (H) 02/22/2023   IRONPCTSAT 4 (L) 02/22/2023   FERRITIN 8 (L) 02/22/2023    Recommend IV venofer weekly x 4

## 2023-02-26 NOTE — Progress Notes (Signed)
Hematology/Oncology Progress note [phone] [phone]    Patient Care Team: [name], NP as PCP - General (Nurse Practitioner) Monika Salk, [facility] (Inactive) as Pharmacist (Pharmacist) Rickard Patience, MD as Consulting Physician (Oncology)  ASSESSMENT & PLAN:   IDA (iron deficiency anemia) #Iron deficiency anemia, Labs reviewed and discussed with patient. Lab Results  Component Value Date   HGB 9.1 (L) 02/22/2023   TIBC 476 (H) 02/22/2023   IRONPCTSAT 4 (L) 02/22/2023   FERRITIN 8 (L) 02/22/2023    Recommend IV venofer weekly x 4 Recommend patient to continue follow up with GI for Barrett esophagus surveillance.   Orders Placed This Encounter  Procedures   CBC with Differential (Cancer Center Only)    Standing Status:   Future    Standing Expiration Date:   02/26/2024   Iron and TIBC    Standing Status:   Future    Standing Expiration Date:   02/26/2024   Ferritin    Standing Status:   Future    Standing Expiration Date:   02/26/2024   Retic Panel    Standing Status:   Future    Standing Expiration Date:   02/26/2024   Follow up in 2 months  All questions were answered. The patient knows to call the clinic with any problems, questions or concerns.  Rickard Patience, MD, PhD [facility] Hematology Oncology 02/26/2023   CHIEF COMPLAINTS/REASON FOR VISIT:  Follow-up iron deficiency anemia  HISTORY OF PRESENTING ILLNESS:   Summer Murphy is a  67 y.o.  female with PMH listed below was seen in consultation at the request of  [name], NP  for evaluation of iron deficiency anemia.  Reviewed patient's previous labs.  Anemia is chronic, date back to August 2021.  09/16/20 TIBC 454, ferritin 12, iron saturation 5,  10/11/20 Hemoglobin 10.5,   Patient history of Barrett's esophagus, has had multiple EGDs.  Recent EGDs listed below.  05/29/2020 upper EGD Barrett's esophagus. Treated with argon plasma  coagulation (APC).   - Benign-appearing  esophageal stenosis. Dilated.  - LA Grade B reflux esophagitis with no bleeding.  - An anti-reflux surgical site was found herniated  above the diaphragm.   07/24/2020 upper EGD showed Barrett's esophagus. Treated with argon plasma coagulation (APC). Benign-appearing esophageal stenosis. Dilated.      - An anti-reflux surgical site was found.   - Large hiatal hernia.   10/02/2020 upper EGD showed There is no endoscopic evidence of Barrett's esophagus. Biopsied. Large hiatal hernia, - An anti-reflux surgical site was found. - A large amount of food (residue) in the stomach.  Stomach high cardia biopsy showed cardio-oxyntic type mucosa with faveolar hyperplasia.  Esophagus biopsy showed squamous esophageal mucosa with chronic esophagitis and increased intraepithelial eosinophils.   She has had colonoscopy in 2018, procedure was aborted due to poor prep.   Patient reports fatigue. Denies any black or bloody stool. She has chronic constipation and can not tolerate oral iron supplementation.   INTERVAL HISTORY Summer Murphy is a 67 y.o. female who has above history reviewed by me today presents for follow up visit for management of iron deficiency anemia. Patient reports feeling well.  Patient tolerated IV Venofer treatments +worse fatigue. She has upcoming C3-6 posterior cervical laminectomy and fusion next week.  Denies hematochezia, hematuria, hematemesis, epistaxis, black tarry stool or easy bruising.    Review of Systems  Constitutional:  Positive for fatigue. Negative for appetite change, chills and fever.  HENT:   Negative for  hearing loss and voice change.   Eyes:  Negative for eye problems.  Respiratory:  Negative for chest tightness and cough.   Cardiovascular:  Negative for chest pain.  Gastrointestinal:  Negative for abdominal distention, abdominal pain and blood in stool.  Endocrine: Negative for hot flashes.  Genitourinary:  Negative for difficulty urinating and frequency.    Musculoskeletal:  Positive for neck pain. Negative for arthralgias.  Skin:  Negative for itching and rash.  Neurological:  Negative for dizziness and extremity weakness.  Hematological:  Negative for adenopathy.  Psychiatric/Behavioral:  Negative for confusion.     MEDICAL HISTORY:  Past Medical History:  Diagnosis Date   Acid reflux    Anemia    Anxiety    a.) buspirone BID + BZO PRN (clonazepam)   Arthritis    Asthma    Cervical disc disorder with radiculopathy of cervical region    Chronic pain syndrome    a.) on COT as prescribed by pain mangement   Chronic, continuous use of opioids    a.) followed by pain mangement   COPD (chronic obstructive pulmonary disease) (HCC)    DOE (dyspnea on exertion)    Hematuria    Hiatal hernia    History of kidney stones    Hypertension    Hypoglycemia    Insomnia    a.) uses Trazodone PRN   LBBB (left bundle branch block)    Migraine    Murmur    Osteopenia    Palpitations    Pre-diabetes    Restless leg    a.) on ropinirole   Stenosis of cervical spine with myelopathy (HCC)    T2DM (type 2 diabetes mellitus) (HCC)     SURGICAL HISTORY: Past Surgical History:  Procedure Laterality Date   ABDOMINAL HYSTERECTOMY     AMPUTATION TOE Left 07/31/2016   Procedure: AMPUTATION TOE/MPJ 2nd toe;  Surgeon: Linus Galas, DPM;  Location: [facility] ORS;  Service: Podiatry;  Laterality: Left;   APPENDECTOMY  1990   BACK SURGERY     low back   BREAST SURGERY     bilateral breast reduction   CARDIAC ELECTROPHYSIOLOGY STUDY AND ABLATION     CHOLECYSTECTOMY  1990   COLONOSCOPY WITH PROPOFOL N/A 01/04/2017   Procedure: COLONOSCOPY WITH PROPOFOL;  Surgeon: Scot Jun, MD;  Location: [facility] ENDOSCOPY;  Service: Endoscopy;  Laterality: N/A;   CORNEAL TRANSPLANT     ESOPHAGOGASTRODUODENOSCOPY N/A 04/09/2021   Procedure: ESOPHAGOGASTRODUODENOSCOPY (EGD);  Surgeon: Toney Reil, MD;  Location: [facility] ENDOSCOPY;  Service: Gastroenterology;   Laterality: N/A;   ESOPHAGOGASTRODUODENOSCOPY (EGD) WITH PROPOFOL N/A 01/04/2017   Procedure: ESOPHAGOGASTRODUODENOSCOPY (EGD) WITH PROPOFOL;  Surgeon: Scot Jun, MD;  Location: [facility] ENDOSCOPY;  Service: Endoscopy;  Laterality: N/A;   EXCISION BONE CYST Left 07/31/2016   Procedure: EXCISION BONE CYST/exostectomy 28124/left 2nd;  Surgeon: Linus Galas, DPM;  Location: [facility] ORS;  Service: Podiatry;  Laterality: Left;   EXTRACORPOREAL SHOCK WAVE LITHOTRIPSY Left 09/12/2015   Procedure: EXTRACORPOREAL SHOCK WAVE LITHOTRIPSY (ESWL);  Surgeon: Vanna Scotland, MD;  Location: [facility] ORS;  Service: Urology;  Laterality: Left;   FRACTURE SURGERY     left foot   GUM SURGERY Left    gum infection   HARDWARE REMOVAL Left 11/14/2020   Procedure: HARDWARE REMOVAL;  Surgeon: Kennedy Bucker, MD;  Location: [facility] ORS;  Service: Orthopedics;  Laterality: Left;   HH repair     Fundoplication   JOINT REPLACEMENT Bilateral 2013,2014   total knees   LAPAROSCOPIC HYSTERECTOMY  LITHOTRIPSY     periprosthetic supracondylar fracture of left femur  02/16/2020   [facility]   TONSILLECTOMY     TOTAL KNEE REVISION Right 11/14/2019   Procedure: Revision patella and tibial polyethylene;  Surgeon: Kennedy Bucker, MD;  Location: [facility] ORS;  Service: Orthopedics;  Laterality: Right;   URETEROSCOPY      SOCIAL HISTORY: Social History   Socioeconomic History   Marital status: Widowed    Spouse name: Not on file   Number of children: Not on file   Years of education: Not on file   Highest education level: Not on file  Occupational History   Not on file  Tobacco Use   Smoking status: Never    Passive exposure: Past   Smokeless tobacco: Never  Vaping Use   Vaping status: Never Used  Substance and Sexual Activity   Alcohol use: No   Drug use: No   Sexual activity: Not on file  Other Topics Concern   Not on file  Social History Narrative   Lives at home alone. Relatives are the support person.     Social Determinants of Health   Financial Resource Strain: Not on file  Food Insecurity: No Food Insecurity (06/02/2022)   Hunger Vital Sign    Worried About Running Out of Food in the Last Year: Never true    Ran Out of Food in the Last Year: Never true  Transportation Needs: No Transportation Needs (06/02/2022)   PRAPARE - Administrator, Civil Service (Medical): No    Lack of Transportation (Non-Medical): No  Physical Activity: Not on file  Stress: Not on file  Social Connections: Not on file  Intimate Partner Violence: Not At Risk (06/02/2022)   Humiliation, Afraid, Rape, and Kick questionnaire    Fear of Current or Ex-Partner: No    Emotionally Abused: No    Physically Abused: No    Sexually Abused: No    FAMILY HISTORY: Family History  Problem Relation Age of Onset   Hypertension Mother    Stroke Mother    Prostate cancer Father    Kidney Stones Father    Diabetes Brother    Hypertension Brother    Breast cancer Maternal Aunt    Kidney disease Neg Hx     ALLERGIES:  is allergic to librium [chlordiazepoxide], reglan [metoclopramide], vanilla, aspirin, nsaids, azithromycin, buprenorphine hcl, morphine, sulfa antibiotics, tolmetin, amlodipine besylate, and iron.  MEDICATIONS:  Current Outpatient Medications  Medication Sig Dispense Refill   Accu-Chek Softclix Lancets lancets Use as instructed once a daily DX R730.01 100 each 12   acetaminophen (TYLENOL) 650 MG CR tablet Take 1,300 mg by mouth every 8 (eight) hours as needed for pain.     alendronate (FOSAMAX) 70 MG tablet Take 1 tablet (70 mg total) by mouth once a week. Take with a full glass of water on an empty stomach. 12 tablet 3   Blood Glucose Monitoring Suppl (ACCU-CHEK GUIDE ME) w/Device KIT Use as directed once a day DX R730.01 1 kit 0   busPIRone (BUSPAR) 10 MG tablet Take 1 tablet (10 mg total) by mouth 2 (two) times daily. 60 tablet 2   clonazePAM (KLONOPIN) 1 MG tablet Take 0.5-1 tablets  (0.5-1 mg total) by mouth 2 (two) times daily as needed for anxiety. 60 tablet 2   diclofenac Sodium (VOLTAREN) 1 % GEL Apply 4 g topically 4 (four) times daily. (Patient taking differently: Apply 4 g topically 4 (four) times daily as needed (pain).)  350 g 1   diltiazem (CARDIZEM CD) 240 MG 24 hr capsule TAKE ONE CAPSULE BY MOUTH DAILY 90 capsule 3   DULoxetine (CYMBALTA) 60 MG capsule Take 1 capsule (60 mg total) by mouth at bedtime. 90 capsule 1   fluticasone (FLONASE) 50 MCG/ACT nasal spray Place 1 spray into both nostrils daily. 18.2 mL 2   fluticasone-salmeterol (WIXELA INHUB) 250-50 MCG/ACT AEPB Inhale 1 puff into the lungs in the morning and at bedtime. 60 each 12   gabapentin (NEURONTIN) 300 MG capsule TAKE 1 CAPSULE(300 MG) BY MOUTH TWICE DAILY 60 capsule 2   gabapentin (NEURONTIN) 600 MG tablet TAKE 1 TABLET(600 MG) BY MOUTH TWICE DAILY 60 tablet 3   glucose blood (ACCU-CHEK GUIDE) test strip Use as instructed once daily Dx R73.01 100 each 12   Ipratropium-Albuterol (COMBIVENT RESPIMAT) 20-100 MCG/ACT AERS respimat INHALE 1 PUFF BY MOUTH EVERY 6 HOURS (Patient taking differently: Inhale 1 puff into the lungs every 6 (six) hours as needed for wheezing or shortness of breath.) 4 g 3   ipratropium-albuterol (DUONEB) 0.5-2.5 (3) MG/3ML SOLN Take 3 mLs by nebulization 4 (four) times daily. (Patient taking differently: Take 3 mLs by nebulization every 6 (six) hours as needed (SOB/Wheezing).) 360 mL 11   lidocaine (XYLOCAINE) 2 % solution Use as directed 10 mLs in the mouth or throat every 6 (six) hours as needed for mouth pain. 400 mL 1   methocarbamol (ROBAXIN) 500 MG tablet Take 1 tablet (500 mg total) by mouth every 8 (eight) hours as needed for up to 10 days for muscle spasms. 30 tablet 0   montelukast (SINGULAIR) 10 MG tablet TAKE 1 TABLET(10 MG) BY MOUTH AT BEDTIME 90 tablet 1   ondansetron (ZOFRAN) 4 MG tablet TAKE 1 TABLET BY MOUTH TWICE DAILY AS NEEDED 45 tablet 1   pantoprazole  (PROTONIX) 40 MG tablet Take 40 mg by mouth 2 (two) times daily.     rOPINIRole (REQUIP) 0.5 MG tablet TAKE 2 TABLETS BY MOUTH TWICE DAILY FOR RESTLESS LEGS 360 tablet 1   SUMAtriptan (IMITREX) 100 MG tablet Take 1 tablet (100 mg total) by mouth every 2 (two) hours as needed (for migraine headaches.). May repeat in 1 hours if headache persists or recurs. 10 tablet 3   tirzepatide (MOUNJARO) 2.5 MG/0.5ML Pen Inject 2.5 mg into the skin once a week. 6 mL 2   traZODone (DESYREL) 150 MG tablet TAKE 1 TABLET(150 MG) BY MOUTH AT BEDTIME 30 tablet 2   EPINEPHrine 0.3 mg/0.3 mL IJ SOAJ injection Inject 0.3 mg into the muscle as needed for anaphylaxis. (Patient not taking: Reported on 02/26/2023) 1 each 3   HYDROcodone-acetaminophen (NORCO/VICODIN) 5-325 MG tablet Take 1 tablet by mouth every 12 (twelve) hours as needed for moderate pain (pain score 4-6). (Patient not taking: Reported on 02/26/2023)     No current facility-administered medications for this visit.     PHYSICAL EXAMINATION: ECOG PERFORMANCE STATUS: 1 - Symptomatic but completely ambulatory Vitals:   02/26/23 0924  BP: (!) 119/95  Pulse: 100  Resp: 18  Temp: (!) 97.2 F (36.2 C)   Filed Weights   02/26/23 0924  Weight: 135 lb 9.6 oz (61.5 kg)    Physical Exam Constitutional:      General: She is not in acute distress. HENT:     Head: Normocephalic.  Eyes:     General: No scleral icterus. Cardiovascular:     Rate and Rhythm: Normal rate and regular rhythm.  Pulmonary:  Effort: Pulmonary effort is normal. No respiratory distress.     Breath sounds: No wheezing.  Abdominal:     General: There is no distension.     Palpations: Abdomen is soft.  Musculoskeletal:        General: Normal range of motion.     Cervical back: Normal range of motion.  Skin:    General: Skin is warm and dry.     Findings: No erythema or rash.  Neurological:     Mental Status: She is alert and oriented to person, place, and time. Mental  status is at baseline.     Cranial Nerves: No cranial nerve deficit.  Psychiatric:        Mood and Affect: Mood normal.     LABORATORY DATA:  I have reviewed the data as listed    Latest Ref Rng & Units 02/22/2023   11:03 AM 09/30/2022   11:59 AM 08/07/2022    2:56 PM  CBC  WBC 4.0 - 10.5 K/uL 6.5  7.3  8.7   Hemoglobin 12.0 - 15.0 g/dL 9.1  91.4  78.2   Hematocrit 36.0 - 46.0 % 30.7  34.5  32.9   Platelets 150 - 400 K/uL 328  272  266       Latest Ref Rng & Units 02/22/2023   11:03 AM 09/30/2022   11:59 AM 08/07/2022    2:56 PM  CMP  Glucose 70 - 99 mg/dL 956   97   BUN 8 - 23 mg/dL 13   22   Creatinine 2.13 - 1.00 mg/dL 0.86   5.78   Sodium 469 - 145 mmol/L 137   137   Potassium 3.5 - 5.1 mmol/L 3.8   3.6   Chloride 98 - 111 mmol/L 101   103   CO2 22 - 32 mmol/L 27   23   Calcium 8.9 - 10.3 mg/dL 8.8  9.4  8.6     Lab Results  Component Value Date   IRON 20 (L) 02/22/2023   TIBC 476 (H) 02/22/2023   FERRITIN 8 (L) 02/22/2023    RADIOGRAPHIC STUDIES: I have personally reviewed the radiological images as listed and agreed with the findings in the report. DG PAIN CLINIC C-ARM 1-60 MIN NO REPORT  Result Date: 12/23/2022 Fluoro was used, but no Radiologist interpretation will be provided. Please refer to "NOTES" tab for provider progress note.

## 2023-02-26 NOTE — Progress Notes (Signed)
Pt here for follow up. She is scheduled for surgery on Tuesday.

## 2023-02-28 ENCOUNTER — Encounter: Payer: Self-pay | Admitting: Nurse Practitioner

## 2023-02-28 DIAGNOSIS — E119 Type 2 diabetes mellitus without complications: Secondary | ICD-10-CM | POA: Insufficient documentation

## 2023-03-02 ENCOUNTER — Encounter
Admission: RE | Disposition: A | Discharge status: Home Health | Payer: Self-pay | Source: Home / Self Care | Attending: Neurosurgery

## 2023-03-02 ENCOUNTER — Other Ambulatory Visit: Payer: Self-pay

## 2023-03-02 ENCOUNTER — Inpatient Hospital Stay
Admission: RE | Admit: 2023-03-02 | Discharge: 2023-03-03 | DRG: 472 | Disposition: A | Discharge status: Home Health | Payer: 59 | Attending: Neurosurgery | Admitting: Neurosurgery

## 2023-03-02 ENCOUNTER — Inpatient Hospital Stay: Payer: 59 | Admitting: Urgent Care

## 2023-03-02 ENCOUNTER — Inpatient Hospital Stay: Payer: 59

## 2023-03-02 ENCOUNTER — Encounter: Payer: Self-pay | Admitting: Neurosurgery

## 2023-03-02 DIAGNOSIS — G4486 Cervicogenic headache: Secondary | ICD-10-CM | POA: Diagnosis present

## 2023-03-02 DIAGNOSIS — Z79899 Other long term (current) drug therapy: Secondary | ICD-10-CM | POA: Diagnosis not present

## 2023-03-02 DIAGNOSIS — M5412 Radiculopathy, cervical region: Secondary | ICD-10-CM | POA: Diagnosis present

## 2023-03-02 DIAGNOSIS — M4802 Spinal stenosis, cervical region: Principal | ICD-10-CM | POA: Diagnosis present

## 2023-03-02 DIAGNOSIS — G2581 Restless legs syndrome: Secondary | ICD-10-CM | POA: Diagnosis present

## 2023-03-02 DIAGNOSIS — M5010 Cervical disc disorder with radiculopathy, unspecified cervical region: Secondary | ICD-10-CM | POA: Diagnosis not present

## 2023-03-02 DIAGNOSIS — E1165 Type 2 diabetes mellitus with hyperglycemia: Secondary | ICD-10-CM | POA: Diagnosis present

## 2023-03-02 DIAGNOSIS — Z886 Allergy status to analgesic agent status: Secondary | ICD-10-CM

## 2023-03-02 DIAGNOSIS — Z888 Allergy status to other drugs, medicaments and biological substances status: Secondary | ICD-10-CM | POA: Diagnosis not present

## 2023-03-02 DIAGNOSIS — Z7983 Long term (current) use of bisphosphonates: Secondary | ICD-10-CM

## 2023-03-02 DIAGNOSIS — Z7985 Long-term (current) use of injectable non-insulin antidiabetic drugs: Secondary | ICD-10-CM

## 2023-03-02 DIAGNOSIS — G959 Disease of spinal cord, unspecified: Principal | ICD-10-CM | POA: Diagnosis present

## 2023-03-02 DIAGNOSIS — D509 Iron deficiency anemia, unspecified: Secondary | ICD-10-CM | POA: Diagnosis present

## 2023-03-02 DIAGNOSIS — Z01818 Encounter for other preprocedural examination: Secondary | ICD-10-CM

## 2023-03-02 DIAGNOSIS — Z8249 Family history of ischemic heart disease and other diseases of the circulatory system: Secondary | ICD-10-CM | POA: Diagnosis not present

## 2023-03-02 DIAGNOSIS — Z7951 Long term (current) use of inhaled steroids: Secondary | ICD-10-CM | POA: Diagnosis not present

## 2023-03-02 DIAGNOSIS — Z885 Allergy status to narcotic agent status: Secondary | ICD-10-CM | POA: Diagnosis not present

## 2023-03-02 DIAGNOSIS — J4489 Other specified chronic obstructive pulmonary disease: Secondary | ICD-10-CM | POA: Diagnosis present

## 2023-03-02 DIAGNOSIS — I10 Essential (primary) hypertension: Secondary | ICD-10-CM | POA: Diagnosis present

## 2023-03-02 DIAGNOSIS — Z881 Allergy status to other antibiotic agents status: Secondary | ICD-10-CM

## 2023-03-02 DIAGNOSIS — G992 Myelopathy in diseases classified elsewhere: Secondary | ICD-10-CM | POA: Diagnosis not present

## 2023-03-02 DIAGNOSIS — Z89422 Acquired absence of other left toe(s): Secondary | ICD-10-CM | POA: Diagnosis not present

## 2023-03-02 DIAGNOSIS — Z833 Family history of diabetes mellitus: Secondary | ICD-10-CM

## 2023-03-02 DIAGNOSIS — Z823 Family history of stroke: Secondary | ICD-10-CM

## 2023-03-02 DIAGNOSIS — K219 Gastro-esophageal reflux disease without esophagitis: Secondary | ICD-10-CM | POA: Diagnosis present

## 2023-03-02 DIAGNOSIS — Z9102 Food additives allergy status: Secondary | ICD-10-CM | POA: Diagnosis not present

## 2023-03-02 DIAGNOSIS — Z803 Family history of malignant neoplasm of breast: Secondary | ICD-10-CM

## 2023-03-02 DIAGNOSIS — Z882 Allergy status to sulfonamides status: Secondary | ICD-10-CM

## 2023-03-02 DIAGNOSIS — Z01812 Encounter for preprocedural laboratory examination: Secondary | ICD-10-CM

## 2023-03-02 DIAGNOSIS — Z96653 Presence of artificial knee joint, bilateral: Secondary | ICD-10-CM | POA: Diagnosis present

## 2023-03-02 HISTORY — DX: Spinal stenosis, cervical region: M48.02

## 2023-03-02 HISTORY — DX: Myelopathy in diseases classified elsewhere: G99.2

## 2023-03-02 HISTORY — DX: Other forms of dyspnea: R06.09

## 2023-03-02 HISTORY — PX: POSTERIOR CERVICAL FUSION/FORAMINOTOMY: SHX5038

## 2023-03-02 HISTORY — DX: Cardiac murmur, unspecified: R01.1

## 2023-03-02 HISTORY — DX: Other specified disorders of bone density and structure, unspecified site: M85.80

## 2023-03-02 HISTORY — DX: Insomnia, unspecified: G47.00

## 2023-03-02 HISTORY — DX: Opioid use, unspecified, uncomplicated: F11.90

## 2023-03-02 HISTORY — DX: Cervical disc disorder with radiculopathy, unspecified cervical region: M50.10

## 2023-03-02 HISTORY — DX: Chronic pain syndrome: G89.4

## 2023-03-02 HISTORY — DX: Type 2 diabetes mellitus without complications: E11.9

## 2023-03-02 HISTORY — DX: Palpitations: R00.2

## 2023-03-02 HISTORY — DX: Left bundle-branch block, unspecified: I44.7

## 2023-03-02 HISTORY — DX: Diaphragmatic hernia without obstruction or gangrene: K44.9

## 2023-03-02 LAB — GLUCOSE, CAPILLARY
Glucose-Capillary: 127 mg/dL — ABNORMAL HIGH (ref 70–99)
Glucose-Capillary: 102 mg/dL — ABNORMAL HIGH (ref 70–99)
Glucose-Capillary: 228 mg/dL — ABNORMAL HIGH (ref 70–99)
Glucose-Capillary: 222 mg/dL — ABNORMAL HIGH (ref 70–99)

## 2023-03-02 SURGERY — POSTERIOR CERVICAL FUSION/FORAMINOTOMY LEVEL 3
Anesthesia: General | Site: Spine Cervical

## 2023-03-02 MED ORDER — LACTATED RINGERS IV SOLN: INTRAVENOUS | Status: DC | PRN

## 2023-03-02 MED ORDER — SODIUM CHLORIDE FLUSH 0.9 % IV SOLN
INTRAVENOUS | Status: AC
  Filled 2023-03-02: qty 20

## 2023-03-02 MED ORDER — ENOXAPARIN SODIUM 40 MG/0.4ML IJ SOSY
40.0000 mg | PREFILLED_SYRINGE | INTRAMUSCULAR | Status: DC
  Administered 2023-03-03: 40 mg via SUBCUTANEOUS

## 2023-03-02 MED ORDER — MIDAZOLAM HCL 2 MG/2ML IJ SOLN
INTRAMUSCULAR | Status: DC | PRN
  Administered 2023-03-02: 2 mg via INTRAVENOUS

## 2023-03-02 MED ORDER — IPRATROPIUM-ALBUTEROL 0.5-2.5 (3) MG/3ML IN SOLN
RESPIRATORY_TRACT | Status: AC
  Filled 2023-03-02: qty 3

## 2023-03-02 MED ORDER — ROPINIROLE HCL 1 MG PO TABS
1.0000 mg | ORAL_TABLET | Freq: Two times a day (BID) | ORAL | Status: DC
  Administered 2023-03-03: 1 mg via ORAL
  Filled 2023-03-02: qty 1

## 2023-03-02 MED ORDER — ROPINIROLE HCL 0.25 MG PO TABS
0.5000 mg | ORAL_TABLET | Freq: Three times a day (TID) | ORAL | Status: DC
  Filled 2023-03-02 (×2): qty 2

## 2023-03-02 MED ORDER — PHENYLEPHRINE 80 MCG/ML (10ML) SYRINGE FOR IV PUSH (FOR BLOOD PRESSURE SUPPORT)
PREFILLED_SYRINGE | INTRAVENOUS | Status: AC
  Filled 2023-03-02: qty 10

## 2023-03-02 MED ORDER — IPRATROPIUM-ALBUTEROL 0.5-2.5 (3) MG/3ML IN SOLN: 3.0000 mL | Freq: Four times a day (QID) | RESPIRATORY_TRACT | Status: DC | PRN

## 2023-03-02 MED ORDER — LIDOCAINE VISCOUS HCL 2 % MT SOLN
10.0000 mL | Freq: Four times a day (QID) | OROMUCOSAL | Status: DC | PRN
Start: 2023-03-02 — End: 2023-03-03

## 2023-03-02 MED ORDER — GABAPENTIN 300 MG PO CAPS
300.0000 mg | ORAL_CAPSULE | Freq: Three times a day (TID) | ORAL | Status: DC
  Administered 2023-03-02 – 2023-03-03 (×3): 300 mg via ORAL

## 2023-03-02 MED ORDER — ONDANSETRON HCL 4 MG/2ML IJ SOLN: 4.0000 mg | Freq: Once | INTRAMUSCULAR | Status: DC | PRN

## 2023-03-02 MED ORDER — GABAPENTIN 300 MG PO CAPS
ORAL_CAPSULE | ORAL | Status: AC
  Filled 2023-03-02: qty 1

## 2023-03-02 MED ORDER — METHOCARBAMOL 1000 MG/10ML IJ SOLN
INTRAMUSCULAR | Status: AC
  Filled 2023-03-02: qty 10

## 2023-03-02 MED ORDER — SODIUM CHLORIDE 0.9 % IV SOLN: INTRAVENOUS | Status: AC

## 2023-03-02 MED ORDER — CLONAZEPAM 0.5 MG PO TABS: 0.5000 mg | ORAL_TABLET | Freq: Two times a day (BID) | ORAL | Status: DC | PRN

## 2023-03-02 MED ORDER — ALUM & MAG HYDROXIDE-SIMETH 200-200-20 MG/5ML PO SUSP: 30.0000 mL | Freq: Four times a day (QID) | ORAL | Status: DC | PRN

## 2023-03-02 MED ORDER — TRAZODONE HCL 50 MG PO TABS
150.0000 mg | ORAL_TABLET | Freq: Every day | ORAL | Status: DC
  Administered 2023-03-02: 150 mg via ORAL
  Filled 2023-03-02: qty 1

## 2023-03-02 MED ORDER — REMIFENTANIL HCL 1 MG IV SOLR
INTRAVENOUS | Status: AC
  Filled 2023-03-02: qty 1000

## 2023-03-02 MED ORDER — ACETAMINOPHEN 500 MG PO TABS
ORAL_TABLET | ORAL | Status: AC
  Filled 2023-03-02: qty 2

## 2023-03-02 MED ORDER — REMIFENTANIL HCL 1 MG IV SOLR
INTRAVENOUS | Status: DC | PRN
  Administered 2023-03-02: .1 ug/kg/min via INTRAVENOUS

## 2023-03-02 MED ORDER — DULOXETINE HCL 60 MG PO CPEP
60.0000 mg | ORAL_CAPSULE | Freq: Every day | ORAL | Status: DC
Start: 2023-03-02 — End: 2023-03-03
  Administered 2023-03-02: 60 mg via ORAL
  Filled 2023-03-02: qty 1

## 2023-03-02 MED ORDER — SUCCINYLCHOLINE CHLORIDE 200 MG/10ML IV SOSY
PREFILLED_SYRINGE | INTRAVENOUS | Status: AC
  Filled 2023-03-02: qty 10

## 2023-03-02 MED ORDER — PROPOFOL 500 MG/50ML IV EMUL
INTRAVENOUS | Status: DC | PRN
  Administered 2023-03-02: 100 ug/kg/min via INTRAVENOUS

## 2023-03-02 MED ORDER — OXYCODONE HCL 5 MG PO TABS
ORAL_TABLET | ORAL | Status: AC
  Filled 2023-03-02: qty 1

## 2023-03-02 MED ORDER — MOMETASONE FURO-FORMOTEROL FUM 200-5 MCG/ACT IN AERO
2.0000 | INHALATION_SPRAY | Freq: Two times a day (BID) | RESPIRATORY_TRACT | Status: DC
  Administered 2023-03-02 – 2023-03-03 (×2): 2 via RESPIRATORY_TRACT
  Filled 2023-03-02: qty 8.8

## 2023-03-02 MED ORDER — ONDANSETRON HCL 4 MG/2ML IJ SOLN
INTRAMUSCULAR | Status: AC
  Filled 2023-03-02: qty 2

## 2023-03-02 MED ORDER — EPHEDRINE SULFATE-NACL 50-0.9 MG/10ML-% IV SOSY
PREFILLED_SYRINGE | INTRAVENOUS | Status: DC | PRN
  Administered 2023-03-02: 10 mg via INTRAVENOUS
  Administered 2023-03-02 (×2): 5 mg via INTRAVENOUS

## 2023-03-02 MED ORDER — DEXAMETHASONE SODIUM PHOSPHATE 10 MG/ML IJ SOLN
INTRAMUSCULAR | Status: AC
  Filled 2023-03-02: qty 1

## 2023-03-02 MED ORDER — SODIUM CHLORIDE (PF) 0.9 % IJ SOLN
INTRAMUSCULAR | Status: DC | PRN
  Administered 2023-03-02: 60 mL via INTRAMUSCULAR

## 2023-03-02 MED ORDER — MONTELUKAST SODIUM 10 MG PO TABS
10.0000 mg | ORAL_TABLET | Freq: Every day | ORAL | Status: DC
Start: 2023-03-02 — End: 2023-03-03
  Administered 2023-03-02: 10 mg via ORAL
  Filled 2023-03-02: qty 1

## 2023-03-02 MED ORDER — PROPOFOL 10 MG/ML IV BOLUS
INTRAVENOUS | Status: DC | PRN
  Administered 2023-03-02: 130 mg via INTRAVENOUS

## 2023-03-02 MED ORDER — PANTOPRAZOLE SODIUM 40 MG PO TBEC
40.0000 mg | DELAYED_RELEASE_TABLET | Freq: Two times a day (BID) | ORAL | Status: DC
Start: 2023-03-02 — End: 2023-03-03
  Administered 2023-03-02 – 2023-03-03 (×2): 40 mg via ORAL
  Filled 2023-03-02: qty 1

## 2023-03-02 MED ORDER — VANCOMYCIN HCL 1000 MG IV SOLR
INTRAVENOUS | Status: DC | PRN
  Administered 2023-03-02: 1000 mg via INTRAVENOUS

## 2023-03-02 MED ORDER — CEFAZOLIN SODIUM-DEXTROSE 2-4 GM/100ML-% IV SOLN
INTRAVENOUS | Status: AC
  Filled 2023-03-02: qty 100

## 2023-03-02 MED ORDER — BUPIVACAINE-EPINEPHRINE (PF) 0.5% -1:200000 IJ SOLN
INTRAMUSCULAR | Status: DC | PRN
  Administered 2023-03-02: 10 mL

## 2023-03-02 MED ORDER — CHLORHEXIDINE GLUCONATE 0.12 % MT SOLN
OROMUCOSAL | Status: AC
  Filled 2023-03-02: qty 15

## 2023-03-02 MED ORDER — SODIUM CHLORIDE 0.9% FLUSH: 3.0000 mL | INTRAVENOUS | Status: DC | PRN

## 2023-03-02 MED ORDER — SURGIFLO WITH THROMBIN (HEMOSTATIC MATRIX KIT) OPTIME
TOPICAL | Status: DC | PRN
  Administered 2023-03-02 (×2): 1 via TOPICAL

## 2023-03-02 MED ORDER — DILTIAZEM HCL ER COATED BEADS 240 MG PO CP24
240.0000 mg | ORAL_CAPSULE | Freq: Every day | ORAL | Status: DC
  Administered 2023-03-02 – 2023-03-03 (×2): 240 mg via ORAL
  Filled 2023-03-02 (×2): qty 1

## 2023-03-02 MED ORDER — BUSPIRONE HCL 10 MG PO TABS
10.0000 mg | ORAL_TABLET | Freq: Two times a day (BID) | ORAL | Status: DC
  Administered 2023-03-02 – 2023-03-03 (×2): 10 mg via ORAL
  Filled 2023-03-02 (×2): qty 1

## 2023-03-02 MED ORDER — IPRATROPIUM-ALBUTEROL 0.5-2.5 (3) MG/3ML IN SOLN
3.0000 mL | Freq: Four times a day (QID) | RESPIRATORY_TRACT | Status: DC
  Administered 2023-03-02 – 2023-03-03 (×3): 3 mL via RESPIRATORY_TRACT

## 2023-03-02 MED ORDER — CEFAZOLIN SODIUM-DEXTROSE 2-4 GM/100ML-% IV SOLN
2.0000 g | Freq: Once | INTRAVENOUS | Status: AC
Start: 2023-03-02 — End: 2023-03-02
  Administered 2023-03-02: 2 g via INTRAVENOUS

## 2023-03-02 MED ORDER — FENTANYL CITRATE (PF) 100 MCG/2ML IJ SOLN
INTRAMUSCULAR | Status: AC
  Filled 2023-03-02: qty 2

## 2023-03-02 MED ORDER — OXYCODONE HCL 5 MG/5ML PO SOLN: 5.0000 mg | Freq: Once | ORAL | Status: DC | PRN

## 2023-03-02 MED ORDER — FENTANYL CITRATE (PF) 100 MCG/2ML IJ SOLN
25.0000 ug | INTRAMUSCULAR | Status: DC | PRN
  Administered 2023-03-02: 50 ug via INTRAVENOUS
  Administered 2023-03-02 (×2): 25 ug via INTRAVENOUS
  Administered 2023-03-02: 50 ug via INTRAVENOUS

## 2023-03-02 MED ORDER — DOCUSATE SODIUM 100 MG PO CAPS
ORAL_CAPSULE | ORAL | Status: AC
  Filled 2023-03-02: qty 1

## 2023-03-02 MED ORDER — MENTHOL 3 MG MT LOZG: 1.0000 | LOZENGE | OROMUCOSAL | Status: DC | PRN

## 2023-03-02 MED ORDER — LIDOCAINE HCL (CARDIAC) PF 100 MG/5ML IV SOSY
PREFILLED_SYRINGE | INTRAVENOUS | Status: DC | PRN
  Administered 2023-03-02: 70 mg via INTRAVENOUS

## 2023-03-02 MED ORDER — SUCCINYLCHOLINE CHLORIDE 200 MG/10ML IV SOSY
PREFILLED_SYRINGE | INTRAVENOUS | Status: DC | PRN
  Administered 2023-03-02: 80 mg via INTRAVENOUS

## 2023-03-02 MED ORDER — SODIUM CHLORIDE 0.9 % IV SOLN: INTRAVENOUS | Status: DC | PRN

## 2023-03-02 MED ORDER — SUMATRIPTAN SUCCINATE 50 MG PO TABS: 100.0000 mg | ORAL_TABLET | ORAL | Status: DC | PRN

## 2023-03-02 MED ORDER — LIDOCAINE HCL (PF) 2 % IJ SOLN
INTRAMUSCULAR | Status: AC
  Filled 2023-03-02: qty 5

## 2023-03-02 MED ORDER — ACETAMINOPHEN 500 MG PO TABS
1000.0000 mg | ORAL_TABLET | Freq: Four times a day (QID) | ORAL | Status: AC
  Administered 2023-03-02 – 2023-03-03 (×3): 1000 mg via ORAL

## 2023-03-02 MED ORDER — VANCOMYCIN HCL 1000 MG IV SOLR
INTRAVENOUS | Status: AC
  Filled 2023-03-02: qty 20

## 2023-03-02 MED ORDER — OXYCODONE HCL 5 MG PO TABS
5.0000 mg | ORAL_TABLET | ORAL | Status: DC | PRN
  Administered 2023-03-02 – 2023-03-03 (×6): 5 mg via ORAL

## 2023-03-02 MED ORDER — SODIUM CHLORIDE 0.9% FLUSH
3.0000 mL | Freq: Two times a day (BID) | INTRAVENOUS | Status: DC
  Administered 2023-03-02 – 2023-03-03 (×2): 3 mL via INTRAVENOUS

## 2023-03-02 MED ORDER — ACETAMINOPHEN 325 MG PO TABS: 650.0000 mg | ORAL_TABLET | ORAL | Status: DC | PRN

## 2023-03-02 MED ORDER — SENNA 8.6 MG PO TABS
1.0000 | ORAL_TABLET | Freq: Two times a day (BID) | ORAL | Status: DC
  Administered 2023-03-02 – 2023-03-03 (×2): 8.6 mg via ORAL

## 2023-03-02 MED ORDER — IRRISEPT - 450ML BOTTLE WITH 0.05% CHG IN STERILE WATER, USP 99.95% OPTIME
TOPICAL | Status: DC | PRN
  Administered 2023-03-02: 450 mL

## 2023-03-02 MED ORDER — CYCLOBENZAPRINE HCL 10 MG PO TABS
10.0000 mg | ORAL_TABLET | Freq: Three times a day (TID) | ORAL | Status: DC | PRN
  Administered 2023-03-03 (×2): 10 mg via ORAL
  Filled 2023-03-02 (×3): qty 1

## 2023-03-02 MED ORDER — PROPOFOL 10 MG/ML IV BOLUS
INTRAVENOUS | Status: AC
  Filled 2023-03-02: qty 20

## 2023-03-02 MED ORDER — CEFAZOLIN SODIUM-DEXTROSE 2-4 GM/100ML-% IV SOLN
2.0000 g | Freq: Four times a day (QID) | INTRAVENOUS | Status: AC
  Administered 2023-03-02 (×2): 2 g via INTRAVENOUS

## 2023-03-02 MED ORDER — INSULIN ASPART 100 UNIT/ML IJ SOLN
0.0000 [IU] | Freq: Three times a day (TID) | INTRAMUSCULAR | Status: DC
  Administered 2023-03-02: 3 [IU] via SUBCUTANEOUS
  Administered 2023-03-03: 2 [IU] via SUBCUTANEOUS

## 2023-03-02 MED ORDER — HYDROMORPHONE HCL 1 MG/ML IJ SOLN: 1.0000 mg | INTRAMUSCULAR | Status: DC | PRN

## 2023-03-02 MED ORDER — DOCUSATE SODIUM 100 MG PO CAPS
100.0000 mg | ORAL_CAPSULE | Freq: Two times a day (BID) | ORAL | Status: DC
  Administered 2023-03-02 – 2023-03-03 (×2): 100 mg via ORAL

## 2023-03-02 MED ORDER — FENTANYL CITRATE (PF) 100 MCG/2ML IJ SOLN
INTRAMUSCULAR | Status: DC | PRN
  Administered 2023-03-02: 100 ug via INTRAVENOUS

## 2023-03-02 MED ORDER — DEXAMETHASONE SODIUM PHOSPHATE 10 MG/ML IJ SOLN
INTRAMUSCULAR | Status: DC | PRN
  Administered 2023-03-02: 10 mg via INTRAVENOUS

## 2023-03-02 MED ORDER — ACETAMINOPHEN 10 MG/ML IV SOLN: 1000.0000 mg | Freq: Once | INTRAVENOUS | Status: DC | PRN

## 2023-03-02 MED ORDER — SODIUM CHLORIDE 0.9 % IV SOLN: 250.0000 mL | INTRAVENOUS | Status: AC

## 2023-03-02 MED ORDER — BUPIVACAINE HCL (PF) 0.5 % IJ SOLN
INTRAMUSCULAR | Status: AC
  Filled 2023-03-02: qty 30

## 2023-03-02 MED ORDER — BUPIVACAINE-EPINEPHRINE (PF) 0.5% -1:200000 IJ SOLN
INTRAMUSCULAR | Status: AC
  Filled 2023-03-02: qty 10

## 2023-03-02 MED ORDER — SODIUM CHLORIDE 0.9% FLUSH: 10.0000 mL | Freq: Two times a day (BID) | INTRAVENOUS | Status: DC

## 2023-03-02 MED ORDER — PHENOL 1.4 % MT LIQD
1.0000 | OROMUCOSAL | Status: DC | PRN
  Administered 2023-03-02: 1 via OROMUCOSAL
  Filled 2023-03-02: qty 177

## 2023-03-02 MED ORDER — MIDAZOLAM HCL 2 MG/2ML IJ SOLN
INTRAMUSCULAR | Status: AC
  Filled 2023-03-02: qty 2

## 2023-03-02 MED ORDER — PHENYLEPHRINE 80 MCG/ML (10ML) SYRINGE FOR IV PUSH (FOR BLOOD PRESSURE SUPPORT)
PREFILLED_SYRINGE | INTRAVENOUS | Status: DC | PRN
  Administered 2023-03-02 (×3): 80 ug via INTRAVENOUS

## 2023-03-02 MED ORDER — METHOCARBAMOL 1000 MG/10ML IJ SOLN
500.0000 mg | Freq: Once | INTRAMUSCULAR | Status: AC
Start: 2023-03-02 — End: 2023-03-02
  Administered 2023-03-02: 500 mg via INTRAVENOUS

## 2023-03-02 MED ORDER — LACTATED RINGERS IV SOLN
INTRAVENOUS | Status: AC
Start: 2023-03-02 — End: 2023-03-02

## 2023-03-02 MED ORDER — OXYCODONE HCL 5 MG PO TABS
5.0000 mg | ORAL_TABLET | Freq: Once | ORAL | Status: DC | PRN
Start: 2023-03-02 — End: 2023-03-02

## 2023-03-02 MED ORDER — ORAL CARE MOUTH RINSE: 15.0000 mL | Freq: Once | OROMUCOSAL | Status: AC

## 2023-03-02 MED ORDER — SODIUM CHLORIDE 0.9 % IR SOLN
Status: DC | PRN
  Administered 2023-03-02: 500 mL

## 2023-03-02 MED ORDER — INSULIN ASPART 100 UNIT/ML IJ SOLN
INTRAMUSCULAR | Status: AC
  Filled 2023-03-02: qty 1

## 2023-03-02 MED ORDER — CHLORHEXIDINE GLUCONATE 0.12 % MT SOLN
15.0000 mL | Freq: Once | OROMUCOSAL | Status: AC
  Administered 2023-03-02: 15 mL via OROMUCOSAL

## 2023-03-02 MED ORDER — ACETAMINOPHEN 650 MG RE SUPP: 650.0000 mg | RECTAL | Status: DC | PRN

## 2023-03-02 MED ORDER — FLUTICASONE PROPIONATE 50 MCG/ACT NA SUSP
1.0000 | Freq: Every day | NASAL | Status: DC
  Administered 2023-03-02 – 2023-03-03 (×2): 1 via NASAL
  Filled 2023-03-02: qty 16

## 2023-03-02 MED ORDER — OXYCODONE HCL 5 MG PO TABS: 10.0000 mg | ORAL_TABLET | ORAL | Status: DC | PRN

## 2023-03-02 MED ORDER — BUPIVACAINE LIPOSOME 1.3 % IJ SUSP
INTRAMUSCULAR | Status: AC
  Filled 2023-03-02: qty 20

## 2023-03-02 MED ORDER — PHENYLEPHRINE HCL-NACL 20-0.9 MG/250ML-% IV SOLN
INTRAVENOUS | Status: DC | PRN
  Administered 2023-03-02: 20 ug/min via INTRAVENOUS

## 2023-03-02 MED ORDER — PROPOFOL 1000 MG/100ML IV EMUL
INTRAVENOUS | Status: AC
  Filled 2023-03-02: qty 100

## 2023-03-02 SURGICAL SUPPLY — 58 items
7mm flat jackson-pratt drain IMPLANT
ALLOGRAFT BONE FIBER KORE 5 (Bone Implant) IMPLANT
BASIN KIT SINGLE STR (MISCELLANEOUS) ×2 IMPLANT
BIT DRILL NS LF DISP RELINE C (BIT) IMPLANT
BIT DRL NS LF DISP RELINE C (BIT) ×1
BRUSH SCRUB EZ 4% CHG (MISCELLANEOUS) ×2 IMPLANT
BUR NEURO DRILL SOFT 3.0X3.8M (BURR) ×2 IMPLANT
DERMABOND ADVANCED .7 DNX12 (GAUZE/BANDAGES/DRESSINGS) ×2 IMPLANT
DRAPE C-ARM XRAY 36X54 (DRAPES) ×4 IMPLANT
DRAPE LAPAROTOMY 100X77 ABD (DRAPES) ×2 IMPLANT
DRAPE MICROSCOPE SPINE 48X150 (DRAPES) IMPLANT
DRSG OPSITE POSTOP 3X4 (GAUZE/BANDAGES/DRESSINGS) ×2 IMPLANT
DRSG OPSITE POSTOP 4X6 (GAUZE/BANDAGES/DRESSINGS) IMPLANT
ELECT EZSTD 165MM 6.5IN (MISCELLANEOUS)
ELECTRODE EZSTD 165MM 6.5IN (MISCELLANEOUS) ×2 IMPLANT
EVACUATOR 1/8 PVC DRAIN (DRAIN) IMPLANT
EVACUATOR DRAINAGE 7X20 100CC (MISCELLANEOUS) IMPLANT
FEE INTRAOP CADWELL SUPPLY NCS (MISCELLANEOUS) IMPLANT
FEE INTRAOP MONITOR IMPULS NCS (MISCELLANEOUS) IMPLANT
GAUZE SPONGE 2X2 STRL 8-PLY (GAUZE/BANDAGES/DRESSINGS) IMPLANT
GLOVE BIOGEL PI IND STRL 6.5 (GLOVE) ×2 IMPLANT
GLOVE BIOGEL PI IND STRL 8 (GLOVE) ×2 IMPLANT
GLOVE SURG SYN 6.5 ES PF (GLOVE) ×1 IMPLANT
GLOVE SURG SYN 6.5 PF PI (GLOVE) ×2 IMPLANT
GLOVE SURG SYN 7.5 E (GLOVE) ×2 IMPLANT
GLOVE SURG SYN 7.5 PF PI (GLOVE) ×4 IMPLANT
GOWN SRG LRG LVL 4 IMPRV REINF (GOWNS) ×2 IMPLANT
GOWN SRG XL LVL 3 NONREINFORCE (GOWNS) ×2 IMPLANT
HOLDER FOLEY CATH W/STRAP (MISCELLANEOUS) IMPLANT
INTRAOP CADWELL SUPPLY FEE NCS (MISCELLANEOUS) ×1
INTRAOP MONITOR FEE IMPULS NCS (MISCELLANEOUS) ×1
JET LAVAGE IRRISEPT WOUND (IRRIGATION / IRRIGATOR) ×1
LAVAGE JET IRRISEPT WOUND (IRRIGATION / IRRIGATOR) IMPLANT
MANIFOLD NEPTUNE II (INSTRUMENTS) ×2 IMPLANT
MARKER SKIN DUAL TIP RULER LAB (MISCELLANEOUS) ×2 IMPLANT
NS IRRIG 1000ML POUR BTL (IV SOLUTION) ×2 IMPLANT
NS IRRIG 500ML POUR BTL (IV SOLUTION) IMPLANT
PACK LAMINECTOMY ARMC (PACKS) ×2 IMPLANT
PAD ARMBOARD 7.5X6 YLW CONV (MISCELLANEOUS) ×2 IMPLANT
PIN MAYFIELD SKULL DISP (PIN) ×2 IMPLANT
ROD RELINE C PB 3.5 X50 (Screw) IMPLANT
SCREW LOCK RELINE C OPEN (Screw) IMPLANT
SCREW MA RELINE C 3.5X14 (Screw) ×6 IMPLANT
SCREW SP MA RELINE-C 3.5X12 (Screw) IMPLANT
SCREW SP MA RELINE-C 3.5X14 (Screw) IMPLANT
SCREW SPINAL RELINE C 3.5X12 (Screw) ×2 IMPLANT
STAPLER SKIN PROX 35W (STAPLE) ×2 IMPLANT
SURGIFLO W/THROMBIN 8M KIT (HEMOSTASIS) ×2 IMPLANT
SUT ETHILON 3-0 FS-10 30 BLK (SUTURE)
SUT STRATA 3-0 15 PS-2 (SUTURE) IMPLANT
SUT VIC AB 0 CT1 27XCR 8 STRN (SUTURE) ×6 IMPLANT
SUT VIC AB 2-0 CT1 18 (SUTURE) ×4 IMPLANT
SUTURE EHLN 3-0 FS-10 30 BLK (SUTURE) IMPLANT
SYR 30ML LL (SYRINGE) ×4 IMPLANT
SYR 3ML LL SCALE MARK (SYRINGE) ×2 IMPLANT
TAPE CLOTH 3X10 WHT NS LF (GAUZE/BANDAGES/DRESSINGS) ×4 IMPLANT
TRAP FLUID SMOKE EVACUATOR (MISCELLANEOUS) ×2 IMPLANT
TRAY FOLEY SLVR 16FR LF STAT (SET/KITS/TRAYS/PACK) IMPLANT

## 2023-03-02 NOTE — Anesthesia Postprocedure Evaluation (Signed)
Anesthesia Post Note  Patient: Summer Murphy  Procedure(s) Performed: C3-6 POSTERIOR CERVICAL LAMINECTOMY AND FUSION (Spine Cervical)  Patient location during evaluation: PACU Anesthesia Type: General Level of consciousness: awake and alert, oriented and patient cooperative Pain management: pain level controlled Vital Signs Assessment: post-procedure vital signs reviewed and stable Respiratory status: spontaneous breathing, nonlabored ventilation and respiratory function stable Cardiovascular status: blood pressure returned to baseline and stable Postop Assessment: adequate PO intake Anesthetic complications: no   No notable events documented.   Last Vitals:  Vitals:   03/02/23 1044 03/02/23 1045  BP: (!) 153/80 (!) 153/80  Pulse: 99 98  Resp: 17 14  Temp:    SpO2: 92% 91%    Last Pain:  Vitals:   03/02/23 1045  PainSc: 8                  Reed Breech

## 2023-03-02 NOTE — Transfer of Care (Signed)
Immediate Anesthesia Transfer of Care Note  Patient: Summer Murphy  Procedure(s) Performed: C3-6 POSTERIOR CERVICAL LAMINECTOMY AND FUSION (Spine Cervical)  Patient Location: PACU  Anesthesia Type:General  Level of Consciousness: awake, alert , and oriented  Airway & Oxygen Therapy: Patient Spontanous Breathing and Patient connected to face mask oxygen  Post-op Assessment: Report given to RN and Post -op Vital signs reviewed and stable  Post vital signs: stable  Last Vitals:  Vitals Value Taken Time  BP 149/77 03/02/23 1023  Temp    Pulse 106 03/02/23 1027  Resp 21 03/02/23 1027  SpO2 100 % 03/02/23 1027  Vitals shown include unfiled device data.  Last Pain:  Vitals:   03/02/23 0611  PainSc: 0-No pain         Complications: No notable events documented.

## 2023-03-02 NOTE — Op Note (Signed)
Indications: Ms. [name] is a 67 y.o. female with cervical myelopathy.  She has had progressive issues with her upper extremities and lower extremities with worsening gait abnormalities, on physical examination she was found to have progressively worsening long track signs including hyperreflexia and loss of proprioception.  Findings: Severe stenosis from C3-C6  Preoperative Diagnosis: Cervical myelopathy Postoperative Diagnosis: same   EBL: 10ml IVF: see anesthesia record Drains: Subfascial 7 [place] JP Disposition: Extubated and Stable to PACU Complications: none  No foley catheter was placed.  Preoperative Note:   Risks of surgery discussed include: infection, bleeding, stroke, coma, death, paralysis, CSF leak, nerve/spinal cord injury, numbness, tingling, weakness, complex regional pain syndrome, recurrent stenosis and/or disc herniation, vascular injury, development of instability, neck/back pain, need for further surgery, persistent symptoms, development of deformity, and the risks of anesthesia. The patient understood these risks and agreed to proceed.  Operative Note:   OPERATIVE PROCEDURE:  1. Posterior Segmental Instrumentation C3-6 using Nuvasive Reline C 2. Posterolateral arthrodesis from C3-6 3. Cervical Laminectomy from C3-C6 for decompression of the spinal cord 4. Harvesting of autograft via the same incision 5. Use of flouroscopy   OPERATIVE PROCEDURE:  After induction of general anesthesia, the patient was placed in the prone position on the operative table.  A midline incision was then planned using fluoroscopy.  A timeout was performed, and antibiotics given.  Next, the posterior cervical region was prepped and draped in the usual sterile fashion. The incision was injected with local anesthetic, then opened sharply. A subperiosteal dissection was then carried out to expose the posterior elements from C2 and C6, with careful attention paid to maintaining the  C2/3 facet capsule.  After satisfactory exposure had been obtained, our attention was turned to placement of lateral mass screws.  On each side, the high speed drill was used to remove the soft tissue of the facet from C3/4 to C5/6. Lateral mass screws were then placed at each level using a modified Magerl technique. Briefly, a pilot hole was drilled in the lateral mass using the high-speed drill on each side.  Next, a drill was used to drill a tract in each lateral mass to 14mm. A balltip probe was used to confirm lack of breach. We then placed 3.5x 14 mm screws at C3, 3.5x 14 mm screws at C4, 3.5x 14  mm screws at C5 left and 12mm right, and 3.5x 14 mm screws at C6 left and 12 mm right.  Rods were measured and shaped, then secured to the screws according to manufacturer's specifications.  After placement of the lateral mass screws, the high speed drill was used to drill trough laminectomies from C3 to C6. The spinous processes were disconnected from the intraspinous ligaments at C2/3 and C6/7, then the ligamentum flavum was carefully divided at C2/3 and C6/7 using a Kerrison punch and upgoing curettes. The laminae and spinous processes were then removed en bloc. The decompression was completed using a Kerrison punch until the spinal cord was adequately decompressed.  The leading edge of C7 and the inferior margin of C2 were also felt to ensure adequate decompression.   After decompression was complete, final AP and lateral radiographs were taken.  The wound was copiously irrigated and hemostasis was achieved.  Using high-speed drill, the lateral margin of the lateral masses was gently decorticated.  The bone harvested from the laminectomy was processed and placed posterolaterally and in the facet joints to aid in arthrodesis.  A JP drain was then placed in  the wound deep to the fascia.   The wound was closed in a multilayer fashion using interrupted 0 and 2-0 Vicryl sutures.  The final skin edges were  reapproximated using a staples. After closure, the patient was flipped supine. Patient was then handed back over to anesthesia.  All counts were correct at the conclusion of the procedure.  Neurological monitoring was used throughout, and there were no changes.  Joan Flores acted as an Designer, television/film set throughout the case. An assistant was required for this procedure due to the complexity.  The assistant provided assistance in tissue manipulation and suction, and was required for the successful and safe performance of the procedure. I performed the critical portions of the procedure.  Lovenia Kim MD  Implant Name Type Inv. Item Serial No. Manufacturer Lot No. LRB No. Used Action  SCREW LOCK RELINE C OPEN - [id number] Screw SCREW LOCK RELINE C OPEN  [employer]  N/A 8 Implanted  SCREW MA RELINE C 3.5X14 - [id number] Screw SCREW MA RELINE C 3.5X14  [employer]  N/A 6 Implanted  SCREW SPINAL RELINE C 3.5X12 - [id number] Screw SCREW SPINAL RELINE C 3.5X12  [employer]  N/A 2 Implanted  ALLOGRAFT BONE FIBER KORE 5 - [id number] Bone Implant ALLOGRAFT BONE FIBER KORE 5 [id number] MUSCULOSKELETL TRANSPLANT FNDN NA N/A 1 Implanted  ROD RELINE C PB 3.5 X50 - [id number] Screw ROD RELINE C PB 3.5 X50  [employer]  N/A 2 Implanted

## 2023-03-02 NOTE — Anesthesia Procedure Notes (Signed)
Procedure Name: Intubation Date/Time: 03/02/2023 7:21 AM  Performed by: Emeterio Reeve, CRNAPre-anesthesia Checklist: Patient identified, Emergency Drugs available, Suction available and Patient being monitored Patient Re-evaluated:Patient Re-evaluated prior to induction Oxygen Delivery Method: Circle system utilized Preoxygenation: Pre-oxygenation with 100% oxygen Induction Type: IV induction Ventilation: Mask ventilation without difficulty Laryngoscope Size: McGrath and 3 Grade View: Grade I Tube type: Oral Tube size: 7.0 mm Number of attempts: 1 Airway Equipment and Method: Stylet and Oral airway Placement Confirmation: ETT inserted through vocal cords under direct vision, positive ETCO2 and breath sounds checked- equal and bilateral Secured at: 20 cm Tube secured with: Tape Dental Injury: Teeth and Oropharynx as per pre-operative assessment  Comments: Cords clear; neutral c-spine maintained throughout airway instrumentation. CA

## 2023-03-02 NOTE — Plan of Care (Signed)
  Problem: Activity: Goal: Ability to avoid complications of mobility impairment will improve Outcome: Progressing   Problem: Pain Management: Goal: Pain level will decrease Outcome: Progressing   Problem: Skin Integrity: Goal: Will show signs of wound healing Outcome: Progressing

## 2023-03-02 NOTE — Anesthesia Preprocedure Evaluation (Addendum)
Anesthesia Evaluation  Patient identified by MRN, date of birth, ID band Patient awake    Reviewed: Allergy & Precautions, NPO status , Patient's Chart, lab work & pertinent test results  History of Anesthesia Complications Negative for: history of anesthetic complications  Airway Mallampati: III   Neck ROM: Full    Dental  (+) Missing   Pulmonary asthma , COPD   Pulmonary exam normal breath sounds clear to auscultation       Cardiovascular hypertension, Normal cardiovascular exam Rhythm:Regular Rate:Normal  ECG 02/22/23:  Normal sinus rhythm Non-specific intra-ventricular conduction block Cannot rule out Anterior infarct , age undetermined T wave abnormality, consider lateral ischemia  Myocardial perfusion 06/27/21:    Findings are consistent with no prior ischemia. The study is low risk.   No ST deviation was noted.   LV perfusion is equivocal. There is no evidence of ischemia. There is no evidence of infarction.   Left ventricular function is normal. End diastolic cavity size is normal. End systolic cavity size is normal.   There is a fixed septal wall defect likely due to left bundle branch block artifact.  The study is suboptimal due to this as well as extracardiac uptake.   CT attenuation images with no significant coronary calcifications    Neuro/Psych  Headaches PSYCHIATRIC DISORDERS Anxiety Depression    Chronic pain    GI/Hepatic hiatal hernia,GERD  ,,  Endo/Other  diabetes, Type 2    Renal/GU Renal disease (nephrolithiasis)     Musculoskeletal  (+) Arthritis ,    Abdominal   Peds  Hematology   Anesthesia Other Findings   Reproductive/Obstetrics                             Anesthesia Physical Anesthesia Plan  ASA: 3  Anesthesia Plan: General   Post-op Pain Management:    Induction: Intravenous  PONV Risk Score and Plan: 3 and Ondansetron, Dexamethasone and  Treatment may vary due to age or medical condition  Airway Management Planned: Oral ETT  Additional Equipment:   Intra-op Plan:   Post-operative Plan: Extubation in OR  Informed Consent: I have reviewed the patients History and Physical, chart, labs and discussed the procedure including the risks, benefits and alternatives for the proposed anesthesia with the patient or authorized representative who has indicated his/her understanding and acceptance.     Dental advisory given  Plan Discussed with: CRNA  Anesthesia Plan Comments: (Patient consented for risks of anesthesia including but not limited to:  - adverse reactions to medications - damage to eyes, teeth, lips or other oral mucosa - nerve damage due to positioning  - sore throat or hoarseness - damage to heart, brain, nerves, lungs, other parts of body or loss of life  Informed patient about role of CRNA in peri- and intra-operative care.  Patient voiced understanding.)        Anesthesia Quick Evaluation

## 2023-03-02 NOTE — Interval H&P Note (Signed)
History and Physical Interval Note:  03/02/2023 6:53 AM  [name]  has presented today for surgery, with the diagnosis of stenosis of cervical spine with myelopathy  Cervical disc disorder with radiculopathy of cervical region.  The various methods of treatment have been discussed with the patient and family. After consideration of risks, benefits and other options for treatment, the patient has consented to  Procedure(s): C3-6 POSTERIOR CERVICAL LAMINECTOMY AND FUSION (N/A) as a surgical intervention.  The patient's history has been reviewed, patient examined, no change in status, stable for surgery.  I have reviewed the patient's chart and labs.  Questions were answered to the patient's satisfaction.    Heart and lungs clear   Summer Murphy

## 2023-03-02 NOTE — Brief Op Note (Signed)
error 

## 2023-03-03 ENCOUNTER — Encounter: Payer: Self-pay | Admitting: Neurosurgery

## 2023-03-03 LAB — TYPE AND SCREEN
ABO/RH(D): A POS
Antibody Screen: POSITIVE
Unit division: 0
Unit division: 0

## 2023-03-03 LAB — GLUCOSE, CAPILLARY
Glucose-Capillary: 111 mg/dL — ABNORMAL HIGH (ref 70–99)
Glucose-Capillary: 130 mg/dL — ABNORMAL HIGH (ref 70–99)

## 2023-03-03 LAB — BPAM RBC
Blood Product Expiration Date: 202412212359
Blood Product Expiration Date: 202412272359
Unit Type and Rh: 6200
Unit Type and Rh: 6200

## 2023-03-03 MED ORDER — SENNA 8.6 MG PO TABS
ORAL_TABLET | ORAL | Status: AC
  Filled 2023-03-03: qty 1

## 2023-03-03 MED ORDER — DOCUSATE SODIUM 100 MG PO CAPS
ORAL_CAPSULE | ORAL | Status: AC
  Filled 2023-03-03: qty 1

## 2023-03-03 MED ORDER — ORAL CARE MOUTH RINSE: 15.0000 mL | OROMUCOSAL | Status: DC | PRN

## 2023-03-03 MED ORDER — SENNA 8.6 MG PO TABS: 1.0000 | ORAL_TABLET | Freq: Two times a day (BID) | ORAL | 0 refills | Status: DC | PRN

## 2023-03-03 MED ORDER — OXYCODONE HCL 5 MG PO TABS
ORAL_TABLET | ORAL | Status: AC
  Filled 2023-03-03: qty 1

## 2023-03-03 MED ORDER — ACETAMINOPHEN 500 MG PO TABS
ORAL_TABLET | ORAL | Status: AC
  Filled 2023-03-03: qty 2

## 2023-03-03 MED ORDER — IPRATROPIUM-ALBUTEROL 0.5-2.5 (3) MG/3ML IN SOLN
RESPIRATORY_TRACT | Status: AC
  Filled 2023-03-03: qty 3

## 2023-03-03 MED ORDER — SENNOSIDES-DOCUSATE SODIUM 8.6-50 MG PO TABS
ORAL_TABLET | ORAL | Status: AC
  Filled 2023-03-03: qty 1

## 2023-03-03 MED ORDER — GABAPENTIN 300 MG PO CAPS
ORAL_CAPSULE | ORAL | Status: AC
  Filled 2023-03-03: qty 1

## 2023-03-03 MED ORDER — PANTOPRAZOLE SODIUM 40 MG PO TBEC
DELAYED_RELEASE_TABLET | ORAL | Status: AC
  Filled 2023-03-03: qty 1

## 2023-03-03 MED ORDER — CYCLOBENZAPRINE HCL 10 MG PO TABS: 10.0000 mg | ORAL_TABLET | Freq: Three times a day (TID) | ORAL | 0 refills | Status: DC | PRN

## 2023-03-03 MED ORDER — INSULIN ASPART 100 UNIT/ML IJ SOLN
INTRAMUSCULAR | Status: AC
  Filled 2023-03-03: qty 1

## 2023-03-03 MED ORDER — OXYCODONE HCL 5 MG PO TABS: 5.0000 mg | ORAL_TABLET | ORAL | 0 refills | Status: DC | PRN

## 2023-03-03 MED ORDER — ENOXAPARIN SODIUM 40 MG/0.4ML IJ SOSY
PREFILLED_SYRINGE | INTRAMUSCULAR | Status: AC
  Filled 2023-03-03: qty 0.4

## 2023-03-03 NOTE — Progress Notes (Signed)
Patient is not able to walk the distance required to go the bathroom, or he/she is unable to safely negotiate stairs required to access the bathroom.  A 3in1 BSC will alleviate this problem  

## 2023-03-03 NOTE — Discharge Instructions (Signed)
Your surgeon has performed an operation on your cervical spine (neck) to relieve pressure on the spinal cord and/or nerves. This involved making an incision your neck and placing rods and screws. The following are instructions to help in your recovery once you have been discharged from the hospital. Even if you feel well, it is important that you follow these activity guidelines. If you do not let your neck heal properly from the surgery, you can increase the chance of return of your symptoms and other complications.  * Do not take anti-inflammatory medications for 3 months after surgery (naproxen [Aleve], ibuprofen [Advil, Motrin], etc.). These medications can prevent your bones from healing properly.  Celebrex, if prescribed, is ok to take.  Activity    No bending, lifting, or twisting ("BLT"). Avoid lifting objects heavier than 10 pounds (gallon milk jug).  Where possible, avoid household activities that involve lifting, bending, reaching, pushing, or pulling such as laundry, vacuuming, grocery shopping, and childcare. Try to arrange for help from friends and family for these activities while your back heals.  Increase physical activity slowly as tolerated.  Taking short walks is encouraged, but avoid strenuous exercise. Do not jog, run, bicycle, lift weights, or participate in any other exercises unless specifically allowed by your doctor.  Talk to your doctor before resuming sexual activity.  You should not drive until cleared by your doctor.  Until released by your doctor, you should not return to work or school.  You should rest at home and let your body heal.   You may shower three days after your surgery.  After showering, lightly dab your incision dry. Do not take a tub bath or go swimming until approved by your doctor at your follow-up appointment.  If your doctor ordered a cervical collar (neck brace) for you, you should wear it whenever you are out of bed. You may remove it when  lying down or sleeping, but you should wear it at all other times. Not all neck surgeries require a cervical collar.  If you smoke, we strongly recommend that you quit.  Smoking has been proven to interfere with normal bone healing and will dramatically reduce the success rate of your surgery. Please contact [facility] (800-QUIT-NOW) and use the resources at www.[facility].com for assistance in stopping smoking.  Surgical Incision   If you have a dressing on your incision, you may remove it two days after your surgery. Keep your incision area clean and dry.  If you have staples or stitches on your incision, you should have a follow up scheduled for removal. If you do not have staples or stitches, you will have steri-strips (small pieces of surgical tape) or Dermabond glue. The steri-strips/glue should begin to peel away within about a week (it is fine if the steri-strips fall off before then). If the strips are still in place one week after your surgery, you may gently remove them.  Diet           You may return to your usual diet. However, you may experience discomfort when swallowing in the first month after your surgery. This is normal. You may find that softer foods are more comfortable for you to swallow. Be sure to stay hydrated.  When to Contact us  You may experience pain in your neck and/or pain between your shoulder blades. This is normal and should improve in the next few weeks with the help of pain medication, muscle relaxers, and rest. Some patients report that a warm compress  on the back of the neck or between the shoulder blades helps.  However, should you experience any of the following, contact us immediately: New numbness or weakness Pain that is progressively getting worse, and is not relieved by your pain medication, muscle relaxers, rest, and warm compresses Bleeding, redness, swelling, pain, or drainage from surgical incision Chills or flu-like symptoms Fever greater than  101.0 F (38.3 C) Inability to eat, drink fluids, or take medications Problems with bowel or bladder functions Difficulty breathing or shortness of breath Warmth, tenderness, or swelling in your calf Contact Information How to contact us:  If you have any questions/concerns before or after surgery, you can reach [place] at [phone], or you can send a mychart message. We can be reached by phone or mychart 8am-4pm, Monday-Friday.  *Please note: Calls after 4pm are forwarded to a third party answering service. Mychart messages are not routinely monitored during evenings, weekends, and holidays. Please call our office to contact the answering service for urgent concerns during non-business hours.

## 2023-03-03 NOTE — Inpatient Diabetes Management (Signed)
Inpatient Diabetes Program Recommendations  AACE/ADA: New Consensus Statement on Inpatient Glycemic Control (2015)  Target Ranges:  Prepandial:   less than 140 mg/dL      Peak postprandial:   less than 180 mg/dL (1-2 hours)      Critically ill patients:  140 - 180 mg/dL   Lab Results  Component Value Date   GLUCAP 111 (H) 03/03/2023   HGBA1C 6.4 (A) 12/30/2022    Review of Glycemic Control  Latest Reference Range & Units 03/02/23 06:22 03/02/23 10:28 03/02/23 16:12 03/02/23 21:22 03/03/23 07:57  Glucose-Capillary 70 - 99 mg/dL 295 (H) 621 (H) 308 (H) 222 (H) 111 (H)  (H): Data is abnormally high  Diabetes history: DM2 Outpatient Diabetes medications: Mounjaro 2.5 mg weekly Current orders for Inpatient glycemic control: Novolog 0-15 units TID  Received referral for hyperglycemia post-op.  She received decadron 10 mg yesterday in OR which likely cause glucose elevation last evening.  Fasting glucose was 111 mg/dL this morning.  Agree with current orders.    Will continue to follow while inpatient.  Thank you, Dulce Sellar, MSN, CDCES Diabetes Coordinator Inpatient Diabetes Program [phone] (team pager from 8a-5p)

## 2023-03-03 NOTE — Evaluation (Signed)
Physical Therapy Evaluation Patient Details Name: Summer Murphy MRN: [id number] DOB: Jul 19, 1955 Today's Date: 03/03/2023  History of Present Illness  Pt is a 67 year old female cervical myelopathy s/p C3-6 posterior decompression and fusion 03/02/23. PMH includes HTN, COPD, DM2, asthma, hiatal hernia, and renal disease.  Clinical Impression  Pt was pleasant and motivated to participate during the session and put forth good effort throughout. Pt received in chair and able to give detailed history. Pt performed transfers with mod I, exhibiting capable BUE strength for pushoff and steady throughout. Pt amb with RW and CGA demonstrating safe gait pattern and reporting improvement compared to gait before surgery. Pt was able to don/doff cervical brace in seated after cuing without assist. Pt returned to bed with Mod I and log rolling technique. Pt's vitals were monitored during session and remained WNL on RA, pt reporting no adverse symptoms throughout other than minimal neck pain. Pt will benefit from continued PT services upon discharge to safely address deficits listed in patient problem list for decreased caregiver assistance and eventual return to PLOF.        If plan is discharge home, recommend the following: Assistance with cooking/housework;Assist for transportation   Can travel by private vehicle        Equipment Recommendations Rolling walker (2 wheels);BSC/3in1  Recommendations for Other Services       Functional Status Assessment Patient has had a recent decline in their functional status and demonstrates the ability to make significant improvements in function in a reasonable and predictable amount of time.     Precautions / Restrictions Precautions Precautions: Fall;Cervical Precaution Booklet Issued: Yes (comment) Required Braces or Orthoses: Cervical Brace Cervical Brace: Hard collar;Other (comment) (don/doff in sitting, apply when OOB and  ambulating) Restrictions Weight Bearing Restrictions: No      Mobility  Bed Mobility Overal bed mobility: Modified Independent Bed Mobility: Sit to Sidelying, Rolling Rolling: Modified independent (Device/Increase time)       Sit to sidelying: Modified independent (Device/Increase time) General bed mobility comments: pt demonstrating efficent and safe log roll technique to return to bed, swift and smooth without cuing    Transfers Overall transfer level: Modified independent   Transfers: Sit to/from Stand Sit to Stand: Modified independent (Device/Increase time)           General transfer comment: quick and smooth ascent from chair, utilizing both armrests to complete, slightly extra time needed but no real cuing; impulsive    Ambulation/Gait Ambulation/Gait assistance: Clinical research associate (Feet): 160 Feet Assistive device: Rolling walker (2 wheels) Gait Pattern/deviations: Step-through pattern, Decreased step length - right, Decreased step length - left, Decreased stride length Gait velocity: decreased     General Gait Details: consistent cadence with good RW directing, occasional mild unsteadiness/increased swaying but no LOB, slight asymmetry with small L limp but safe throughout, cuing for RW management, posture; pt reporting notable improvement from prior to surgery  Stairs            Wheelchair Mobility     Tilt Bed    Modified Rankin (Stroke Patients Only)       Balance Overall balance assessment: Needs assistance Sitting-balance support: Feet supported, Bilateral upper extremity supported Sitting balance-Leahy Scale: Good Sitting balance - Comments: able to don/doff brace in sitting with no LOB     Standing balance-Leahy Scale: Fair Standing balance comment: steady in static stand, occasional mild unsteadiness during dynamic activities with RW  Pertinent Vitals/Pain Pain  Assessment Pain Assessment: Faces Faces Pain Scale: Hurts a little bit Pain Location: neck Pain Descriptors / Indicators: Discomfort Pain Intervention(s): Monitored during session    Home Living Family/patient expects to be discharged to:: Private residence Living Arrangements: Alone Available Help at Discharge: Friend(s);Available PRN/intermittently Type of Home: Apartment Home Access: Elevator       Home Layout: One level Home Equipment: Shower seat - built in;Grab bars - tub/shower;Rollator (4 wheels);Standard Walker;Cane - single point;Adaptive equipment Additional Comments: received per combination of pt report and chart review    Prior Function Prior Level of Function : Independent/Modified Independent             Mobility Comments: mod I community ambulator with [facility] ADLs Comments: Mod I for all ADLs, typically drives to grocery store, does simple meal prep for meals, performs med managment, needing assist for IADLs,     Extremity/Trunk Assessment   Upper Extremity Assessment Upper Extremity Assessment: Defer to OT evaluation;Overall [facility] for tasks assessed     Lower Extremity Assessment Lower Extremity Assessment: Generalized weakness;Overall [facility] for tasks assessed    Cervical / Trunk Assessment Cervical / Trunk Assessment: Neck Surgery  Communication   Communication Communication: No apparent difficulties Cueing Techniques: Verbal cues  Cognition Arousal: Alert Behavior During Therapy: WFL for tasks assessed/performed Overall Cognitive Status: Within Functional Limits for tasks assessed                                          General Comments      Exercises     Assessment/Plan    PT Assessment Patient needs continued PT services  PT Problem List Decreased strength;Decreased mobility;Pain;Decreased range of motion;Decreased activity tolerance;Decreased balance;Decreased knowledge of use of DME;Decreased coordination;Decreased  safety awareness       PT Treatment Interventions DME instruction;Therapeutic exercise;Balance training;Gait training;Stair training;Therapeutic activities;Patient/family education;Functional mobility training    PT Goals (Current goals can be found in the Care Plan section)  Acute Rehab PT Goals Patient Stated Goal: return to walking normally PT Goal Formulation: With patient Time For Goal Achievement: 03/16/23 Potential to Achieve Goals: Good    Frequency 7X/week     Co-evaluation               AM-PAC PT "6 Clicks" Mobility  Outcome Measure Help needed turning from your back to your side while in a flat bed without using bedrails?: None Help needed moving from lying on your back to sitting on the side of a flat bed without using bedrails?: None Help needed moving to and from a bed to a chair (including a wheelchair)?: None Help needed standing up from a chair using your arms (e.g., wheelchair or bedside chair)?: None Help needed to walk in hospital room?: None Help needed climbing 3-5 steps with a railing? : A Little 6 Click Score: 23    End of Session Equipment Utilized During Treatment: Gait belt Activity Tolerance: Patient tolerated treatment well Patient left: in bed;with call bell/phone within reach;with bed alarm set;with SCD's reapplied Nurse Communication: Mobility status PT Visit Diagnosis: Other abnormalities of gait and mobility (R26.89);Muscle weakness (generalized) (M62.81);Pain Pain - part of body:  (neck)    Time: 0865-7846 PT Time Calculation (min) (ACUTE ONLY): 21 min   Charges:               Rosiland Oz SPT 03/03/23, 2:02 PM

## 2023-03-03 NOTE — Plan of Care (Signed)
  Problem: Activity: Goal: Ability to avoid complications of mobility impairment will improve 03/03/2023 0811 by Wilfred Lacy, RN Outcome: Progressing 03/02/2023 1850 by Wilfred Lacy, RN Outcome: Progressing   Problem: Pain Management: Goal: Pain level will decrease 03/03/2023 0811 by Wilfred Lacy, RN Outcome: Progressing 03/02/2023 1850 by Wilfred Lacy, RN Outcome: Progressing   Problem: Skin Integrity: Goal: Will show signs of wound healing Outcome: Progressing

## 2023-03-03 NOTE — Progress Notes (Signed)
   Neurosurgery Progress Note  History: Summer Murphy is s/p C3-6 decompression and fusion  POD1: Patient reports some neck pain and heaviness in her neck overnight which is largely well-controlled with the current pain medication regimen.  She reports improved sensation in her hands bilaterally.  Physical Exam: Vitals:   03/03/23 0111 03/03/23 0544  BP: (!) 153/69 131/64  Pulse: 85 84  Resp: 16 16  Temp: 98.4 F (36.9 C) 98.1 F (36.7 C)  SpO2: 98% 94%    AA Ox3 CNI  Strength:5/5 throughout BLE. 4-/5 left HG and IO 4+ proximal LUE. 4+ throughout RUE Drain output 162 since surgery  Data:  Other tests/results: NA  Assessment/Plan:  Summer Murphy is a 67 y.o presenting with cervical myelopathy s/p C3-6 laminectomies and C3-6 PSF.   - mobilize - pain control - DVT prophylaxis -Patient expressed some concern about her elevated blood glucose levels.  Will consult diabetic coordinator. -Will continue to monitor drain output throughout today. - PTOT; dispo planning underway.  Patient to wear cervical collar when out of bed and ambulating.  Manning Charity PA-C Department of Neurosurgery

## 2023-03-03 NOTE — Discharge Summary (Signed)
Discharge Summary  Patient ID: Summer Murphy MRN: [id number] DOB/AGE: [birth year] 67 y.o.  Admit date: 03/02/2023 Discharge date: 03/03/2023  Admission Diagnoses: Cervical stenosis with myelopathy Discharge Diagnoses:  Principal Problem:   Cervical myelopathy (HCC) Active Problems:   Foraminal stenosis of cervical region   Cervical disc disorder with radiculopathy of cervical region   Cervicogenic headache   Cervical radicular pain (left severe)   Spinal stenosis in cervical region   Stenosis of cervical spine with myelopathy [facility])   Discharged Condition: good  Hospital Course:  Summer Murphy is a very pleasant 67 year old presenting with cervical stenosis and myelopathy status post C3-6 laminectomy for decompression and C3-6 PSF.  Her intraoperative course was uncomplicated.  She was admitted for pain control, drain output monitoring, and therapy evaluation.  She was seen and evaluated by therapy and deemed appropriate for discharge home on postop day 1 with home health services.  Her drain output reduced to an acceptable level and was removed on the afternoon of postop day 1.  Her pain was well-controlled on oral pain medications.  She was discharged home with prescriptions of oxycodone, Flexeril, and senna to take as needed.  Consults: None  Significant Diagnostic Studies: none  Treatments: surgery: As above.  Please see separately dictated operative report for further details.  Discharge Exam: Blood pressure (!) 120/58, pulse 81, temperature 98.3 F (36.8 C), temperature source Oral, resp. rate 17, height 5\' 3"  (1.6 m), weight 61.5 kg, SpO2 97%. AA Ox3 CNI   Strength:5/5 throughout BLE. 4-/5 left HG and IO 4+ proximal LUE. 4+ throughout RUE Incision with clean post-op dressing in place.   Disposition: Discharge disposition: 06-Home-Health Care Svc       Discharge Instructions     Face-to-face encounter (required for Medicare/Medicaid patients)   Complete by:  As directed    I Susanne Borders certify that this patient is under my care and that I, or a nurse practitioner or physician's assistant working with me, had a face-to-face encounter that meets the physician face-to-face encounter requirements with this patient on 03/03/2023. The encounter with the patient was in whole, or in part for the following medical condition(s) which is the primary reason for home health care (List medical condition): cervical myelopathy   The encounter with the patient was in whole, or in part, for the following medical condition, which is the primary reason for home health care: cervical myelopathy   I certify that, based on my findings, the following services are medically necessary home health services: Physical therapy   Reason for Medically Necessary Home Health Services: Therapy- Investment banker, operational, Patent examiner   My clinical findings support the need for the above services: Unable to leave home safely without assistance and/or assistive device   Further, I certify that my clinical findings support that this patient is homebound due to: Unsafe ambulation due to balance issues   Home Health   Complete by: As directed    To provide the following care/treatments:  PT OT     Incentive spirometry RT   Complete by: As directed       Allergies as of 03/03/2023       Reactions   Librium [chlordiazepoxide] Shortness Of Breath   Reglan [metoclopramide] Hives, Other (See Comments)   hallucinations   Vanilla Shortness Of Breath   Pt reports allergy to vanilla extract only   Aspirin Hives   Nsaids Rash   Rash/flares asthma issues.   Azithromycin Other (See Comments)  Unsure of what the reaction was.   Buprenorphine Hcl Other (See Comments)   Unsure of what the reaction was.   Morphine Other (See Comments)   Unsure of reaction   Sulfa Antibiotics    Other reaction(s): Unknown   Tolmetin    Other Reaction: Allergy   Amlodipine Besylate  Itching, Rash   arms, stomach and forehead   Iron Nausea And Vomiting        Medication List     STOP taking these medications    EPINEPHrine 0.3 mg/0.3 mL Soaj injection Commonly known as: EPI-PEN   HYDROcodone-acetaminophen 5-325 MG tablet Commonly known as: NORCO/VICODIN       TAKE these medications    Accu-Chek Guide Me w/Device Kit Use as directed once a day DX R730.01   Accu-Chek Guide test strip Generic drug: glucose blood Use as instructed once daily Dx R73.01   Accu-Chek Softclix Lancets lancets Use as instructed once a daily DX R730.01   acetaminophen 650 MG CR tablet Commonly known as: TYLENOL Take 1,300 mg by mouth every 8 (eight) hours as needed for pain.   alendronate 70 MG tablet Commonly known as: FOSAMAX Take 1 tablet (70 mg total) by mouth once a week. Take with a full glass of water on an empty stomach.   busPIRone 10 MG tablet Commonly known as: BUSPAR Take 1 tablet (10 mg total) by mouth 2 (two) times daily.   clonazePAM 1 MG tablet Commonly known as: KLONOPIN Take 0.5-1 tablets (0.5-1 mg total) by mouth 2 (two) times daily as needed for anxiety.   cyclobenzaprine 10 MG tablet Commonly known as: FLEXERIL Take 1 tablet (10 mg total) by mouth 3 (three) times daily as needed for muscle spasms.   diclofenac Sodium 1 % Gel Commonly known as: VOLTAREN Apply 4 g topically 4 (four) times daily. What changed:  when to take this reasons to take this   diltiazem 240 MG 24 hr capsule Commonly known as: CARDIZEM CD TAKE ONE CAPSULE BY MOUTH DAILY   DULoxetine 60 MG capsule Commonly known as: CYMBALTA Take 1 capsule (60 mg total) by mouth at bedtime.   fluticasone 50 MCG/ACT nasal spray Commonly known as: FLONASE Place 1 spray into both nostrils daily.   fluticasone-salmeterol 250-50 MCG/ACT Aepb Commonly known as: Wixela Inhub Inhale 1 puff into the lungs in the morning and at bedtime.   gabapentin 300 MG capsule Commonly known as:  NEURONTIN TAKE 1 CAPSULE(300 MG) BY MOUTH TWICE DAILY   gabapentin 600 MG tablet Commonly known as: NEURONTIN TAKE 1 TABLET(600 MG) BY MOUTH TWICE DAILY   ipratropium-albuterol 0.5-2.5 (3) MG/3ML Soln Commonly known as: DUONEB Take 3 mLs by nebulization 4 (four) times daily. What changed:  when to take this reasons to take this   Combivent Respimat 20-100 MCG/ACT Aers respimat Generic drug: Ipratropium-Albuterol INHALE 1 PUFF BY MOUTH EVERY 6 HOURS What changed:  how much to take how to take this when to take this reasons to take this additional instructions   lidocaine 2 % solution Commonly known as: XYLOCAINE Use as directed 10 mLs in the mouth or throat every 6 (six) hours as needed for mouth pain.   montelukast 10 MG tablet Commonly known as: SINGULAIR TAKE 1 TABLET(10 MG) BY MOUTH AT BEDTIME   ondansetron 4 MG tablet Commonly known as: ZOFRAN TAKE 1 TABLET BY MOUTH TWICE DAILY AS NEEDED   oxyCODONE 5 MG immediate release tablet Commonly known as: Oxy IR/ROXICODONE Take 1 tablet (5 mg total)  by mouth every 4 (four) hours as needed for moderate pain (pain score 4-6) or severe pain (pain score 7-10).   pantoprazole 40 MG tablet Commonly known as: PROTONIX Take 40 mg by mouth 2 (two) times daily.   rOPINIRole 0.5 MG tablet Commonly known as: REQUIP TAKE 2 TABLETS BY MOUTH TWICE DAILY FOR RESTLESS LEGS   senna 8.6 MG Tabs tablet Commonly known as: SENOKOT Take 1 tablet (8.6 mg total) by mouth 2 (two) times daily as needed for mild constipation.   SUMAtriptan 100 MG tablet Commonly known as: IMITREX Take 1 tablet (100 mg total) by mouth every 2 (two) hours as needed (for migraine headaches.). May repeat in 1 hours if headache persists or recurs.   tirzepatide 2.5 MG/0.5ML Pen Commonly known as: MOUNJARO Inject 2.5 mg into the skin once a week.   traZODone 150 MG tablet Commonly known as: DESYREL TAKE 1 TABLET(150 MG) BY MOUTH AT BEDTIME                Durable Medical Equipment  (From admission, onward)           Start     Ordered   03/03/23 1059  For home use only DME Walker rolling  Once       Question Answer Comment  Walker: With 5 Inch Wheels   Patient needs a walker to treat with the following condition Impaired mobility      03/03/23 1058   03/03/23 1059  For home use only DME Bedside commode  Once       Question:  Patient needs a bedside commode to treat with the following condition  Answer:  Impaired mobility   03/03/23 1058            Follow-up Information     Joan Flores, PA-C Follow up on 03/15/2023.   Specialty: Physician Assistant Contact information: [address] [place], [place] [address] [place] [address] [phone]                 Signed: Susanne Borders 03/03/2023, 1:56 PM

## 2023-03-03 NOTE — TOC Initial Note (Signed)
Transition of Care [facility]) - Initial/Assessment Note    Patient Details  Name: Summer Murphy MRN: [id number] Date of Birth: [birth year]  Transition [facility] CM/SW Contact:    Marlowe Sax, RN Phone Number: 03/03/2023, 11:02 AM  Clinical Narrative:                 The patient will get a Rolling walker and 3 in 1 from [employer]  Expected Discharge Plan: Home/Self Care Barriers to Discharge: Continued Medical Work up   Patient Goals and CMS Choice            Expected Discharge Plan and Services   Discharge Planning Services: CM Consult                     DME Arranged: Dan Humphreys rolling, 3-N-1 DME Agency: [employer] Date DME Agency Contacted: 03/03/23 Time DME Agency Contacted: 1101 Representative spoke with at DME Agency: Cletis [place]            Prior Living Arrangements/Services     Patient language and need for interpreter reviewed:: Yes Do you feel safe going back to the place where you live?: Yes      Need for Family Participation in Patient Care: Yes (Comment) Care giver support system in place?: Yes (comment) Current home services: DME Criminal Activity/Legal Involvement Pertinent to Current Situation/Hospitalization: No - Comment as needed  Activities of Daily Living   ADL Screening (condition at time of admission) Independently performs ADLs?: Yes (appropriate for developmental age) Is the patient deaf or have difficulty hearing?: No Does the patient have difficulty seeing, even when wearing glasses/contacts?: No Does the patient have difficulty concentrating, remembering, or making decisions?: No  Permission Sought/Granted   Permission granted to share information with : Yes, Verbal Permission Granted              Emotional Assessment Appearance:: Appears stated age   Affect (typically observed): Accepting Orientation: : Oriented to Self, Oriented to Place, Oriented to  Time, Oriented to Situation Alcohol / Substance Use: Not  Applicable Psych Involvement: No (comment)  Admission diagnosis:  Cervical myelopathy (HCC) [G95.9] Patient Active Problem List   Diagnosis Date Noted   Cervical myelopathy (HCC) 03/02/2023   Diabetes mellitus (HCC) 02/28/2023   Spinal stenosis in cervical region 02/04/2023   Stenosis of cervical spine with myelopathy (HCC) 02/04/2023   Cervical radicular pain (left severe) 12/10/2022   Foraminal stenosis of cervical region 11/25/2022   Cervical disc disorder with radiculopathy of cervical region 11/25/2022   Cervicogenic headache 11/25/2022   Chronic asthmatic bronchitis (HCC) 06/12/2022   Uncontrolled type 2 diabetes mellitus with hyperglycemia, without long-term current use of insulin (HCC) 06/03/2022   Aspiration pneumonia (HCC) 06/02/2022   Anxiety 06/02/2022   Essential hypertension 06/02/2022   Acute respiratory failure with hypoxia (HCC) 06/02/2022   Acute asthma exacerbation 06/01/2022   DOE (dyspnea on exertion) 05/13/2022   Multifocal pneumonia 05/12/2022   Hypertensive urgency 05/12/2022   Sepsis (HCC) 05/12/2022   CAP (community acquired pneumonia) 04/24/2022   PNA (pneumonia) 04/24/2022   Hypocalcemia 07/31/2021   Esophageal dysphagia    Benign esophageal stricture    S/P hardware removal 11/14/2020   IDA (iron deficiency anemia) 10/18/2020   Moderate persistent asthma with acute exacerbation 08/21/2020   S/P revision of total knee, right 11/14/2019   Seasonal allergic rhinitis due to pollen 09/20/2019   Callus of foot 08/13/2019   Candidal vaginitis 08/13/2019   Other symptoms and signs involving the  nervous system 08/13/2019   Encounter for general adult medical examination with abnormal findings 08/13/2019   Restless leg syndrome 04/30/2019   Muscle cramps 04/02/2019   Diastolic dysfunction 12/14/2018   SOB (shortness of breath) 12/14/2018   Hiatal hernia with GERD 12/14/2018   Wheezing 12/14/2018   Impaired fasting glucose 12/14/2018   COPD with acute  exacerbation (HCC) 09/28/2018   Acute upper respiratory infection 08/25/2018   Recurrent sinusitis 08/15/2018   Prediabetes 08/15/2018   Nonintractable headache 08/15/2018   Dysuria 08/15/2018   Intractable migraine without status migrainosus 04/22/2018   Nausea 04/22/2018   Polyneuropathy associated with underlying disease (HCC) 04/22/2018   Midline low back pain without sciatica 04/22/2018   Routine cervical smear 04/22/2018   HTN (hypertension) 07/01/2017   Acute bronchitis with asthma 07/01/2017   Allergic rhinitis 07/01/2017   Moderate asthma without complication 01/20/2016   Severe recurrent major depression without psychotic features (HCC) 09/10/2014   COPD (chronic obstructive pulmonary disease) (HCC) 09/10/2014   Calculi, ureter 04/26/2013   Corneal graft malfunction 08/17/2012   Calculus of kidney 04/26/2012   Renal colic 04/26/2012   Urge incontinence 04/26/2012   Diaphragmatic hernia 10/27/2010   Barrett esophagus 07/07/2010   PCP:  [name], NP Pharmacy:   [facility], [place] - [address] [place] [address] Phone: [phone] Fax: [phone]  [facility] [place], [place] - [address] [place] [address] Phone: [phone] Fax: [phone]  [id number] [place], [place] - [address] AT [facility] OF [address] [place] [address] [place] [address] Phone: [phone] Fax: [phone]  [facility]) - [place] - [address] [facility] [address] [place] [address] [place] [address] Phone: [phone] Fax: [phone]  [facility] [phone] [place], [place] - [address] [place] [address] Phone: [phone] Fax: [phone]     Social Determinants of Health (SDOH) Social History: SDOH Screenings   Food Insecurity: No Food Insecurity  (03/02/2023)  Housing: Low Risk  (03/02/2023)  Transportation Needs: No Transportation Needs (03/02/2023)  Utilities: Not At Risk (03/02/2023)  Alcohol Screen: Low Risk  (12/31/2021)  Depression (PHQ2-9): Low Risk  (01/27/2023)  Tobacco Use: Low Risk  (03/02/2023)   SDOH Interventions:     Readmission Risk Interventions     No data to display

## 2023-03-03 NOTE — Plan of Care (Signed)
  Problem: Education: Goal: Ability to verbalize activity precautions or restrictions will improve Outcome: Progressing   Problem: Activity: Goal: Ability to avoid complications of mobility impairment will improve Outcome: Progressing Goal: Ability to tolerate increased activity will improve Outcome: Progressing   Problem: Bladder/Genitourinary: Goal: Urinary functional status for postoperative course will improve Outcome: Progressing

## 2023-03-04 ENCOUNTER — Other Ambulatory Visit: Payer: Self-pay | Admitting: Nurse Practitioner

## 2023-03-04 DIAGNOSIS — Z760 Encounter for issue of repeat prescription: Secondary | ICD-10-CM

## 2023-03-05 ENCOUNTER — Telehealth: Payer: Self-pay | Admitting: Neurosurgery

## 2023-03-05 NOTE — Telephone Encounter (Signed)
I spoke with the patient. She has pain in her right upper arm, and questioned if this is expected. Per discussion with Dr Katrinka Blazing, I informed her that pain/numbness/tingling/cramping would not be unexpected in her arms or hands, or even in her legs or feet. I informed her that Dr Katrinka Blazing said we could send in a steroid taper, but she declined at this time.   She is taking cyclobenzaprine 10mg  TID, gabapentin 900mg  BID, oxycodone 5mg  every 4 hours as needed. She has not been taking any tylenol. She will try adding this to her regimen.   She has some questions about the fit of her neck brace. She will contact [facility] to discuss this.  She is aware that she can call our office for the answering service if she has questions/concerns over the weekend.

## 2023-03-05 NOTE — Telephone Encounter (Signed)
C3-6 posterior cervical laminectomy and fusion on 03/02/23  She has cramping in her right arm in between her shoulder and elbow. The cramping is more of a pain feeling. She said that she does not remember doing anything to her right side for her arm to be hurting. It hurts for her to put on her neck brace. She wants to talk to someone.

## 2023-03-09 ENCOUNTER — Telehealth: Payer: Self-pay | Admitting: Nurse Practitioner

## 2023-03-09 ENCOUNTER — Encounter: Payer: Self-pay | Admitting: Oncology

## 2023-03-09 NOTE — Telephone Encounter (Signed)
Per request, last office note, medication list and next appointment date faxed to [facility]; [phone]

## 2023-03-09 NOTE — Telephone Encounter (Signed)
Received Personal Care CMN from [facility]. Gave to Alyssa to complete-Toni

## 2023-03-10 ENCOUNTER — Ambulatory Visit
Admission: RE | Admit: 2023-03-10 | Discharge: 2023-03-10 | Disposition: A | Discharge status: Home / Self Care | Payer: 59 | Attending: Physician Assistant | Admitting: Physician Assistant

## 2023-03-10 ENCOUNTER — Ambulatory Visit
Admission: RE | Admit: 2023-03-10 | Discharge: 2023-03-10 | Disposition: A | Discharge status: Home / Self Care | Payer: 59 | Source: Ambulatory Visit | Attending: Physician Assistant | Admitting: Physician Assistant

## 2023-03-10 ENCOUNTER — Ambulatory Visit (INDEPENDENT_AMBULATORY_CARE_PROVIDER_SITE_OTHER): Payer: 59 | Admitting: Physician Assistant

## 2023-03-10 ENCOUNTER — Telehealth: Payer: Self-pay

## 2023-03-10 VITALS — BP 122/70 | Temp 98.3°F | Ht 63.0 in | Wt 135.0 lb

## 2023-03-10 DIAGNOSIS — Z9889 Other specified postprocedural states: Secondary | ICD-10-CM

## 2023-03-10 DIAGNOSIS — G992 Myelopathy in diseases classified elsewhere: Secondary | ICD-10-CM | POA: Insufficient documentation

## 2023-03-10 DIAGNOSIS — M4802 Spinal stenosis, cervical region: Secondary | ICD-10-CM

## 2023-03-10 DIAGNOSIS — M5010 Cervical disc disorder with radiculopathy, unspecified cervical region: Secondary | ICD-10-CM | POA: Diagnosis present

## 2023-03-10 MED ORDER — OXYCODONE HCL 5 MG PO TABS: 2.5000 mg | ORAL_TABLET | Freq: Four times a day (QID) | ORAL | 0 refills | Status: AC | PRN

## 2023-03-10 NOTE — Patient Instructions (Addendum)
[facility] [address] [place], [place], [place] [address] [phone]  03/11/23 at 9:30am   Your surgeon has performed an operation on your cervical spine (neck) to relieve pressure on the spinal cord and/or nerves. This involved making an incision in the front of your neck and removing one or more of the discs that support your spine. Next, a small piece of bone, a titanium plate, and screws were used to fuse two or more of the vertebrae (bones) together.  The following are instructions to help in your recovery once you have been discharged from the hospital. Even if you feel well, it is important that you follow these activity guidelines. If you do not let your neck heal properly from the surgery, you can increase the chance of return of your symptoms and other complications.  * Do not take anti-inflammatory medications for 3 months after surgery (naproxen [Aleve], ibuprofen [Advil, Motrin], celecoxib [Celebrex], etc.). These medications can prevent your bones from healing properly.  Activity    No bending, lifting, or twisting ("BLT"). Avoid lifting objects heavier than 10 pounds (gallon milk jug).  Where possible, avoid household activities that involve lifting, bending, reaching, pushing, or pulling such as laundry, vacuuming, grocery shopping, and childcare. Try to arrange for help from friends and family for these activities while your back heals.  Increase physical activity slowly as tolerated.  Taking short walks is encouraged, but avoid strenuous exercise. Do not jog, run, bicycle, lift weights, or participate in any other exercises unless specifically allowed by your doctor.  Talk to your doctor before resuming sexual activity.  You should not drive until cleared by your doctor.  Until released by your doctor, you should not return to work or school.  You should rest at home and let your body heal.   You may shower three days after your surgery.  After showering, lightly dab your  incision dry. Do not take a tub bath or go swimming until approved by your doctor at your follow-up appointment.  If your doctor ordered a cervical collar (neck brace) for you, you should wear it whenever you are out of bed. You may remove it when lying down or sleeping, but you should wear it at all other times. Not all neck surgeries require a cervical collar.  If you smoke, we strongly recommend that you quit.  Smoking has been proven to interfere with normal bone healing and will dramatically reduce the success rate of your surgery. Please contact [facility] (800-QUIT-NOW) and use the resources at www.[facility].com for assistance in stopping smoking.  Surgical Incision   If you have a dressing on your incision, you may remove it two days after your surgery. Keep your incision area clean and dry.  If you have staples or stitches on your incision, you should have a follow up scheduled for removal. If you do not have staples or stitches, you will have steri-strips (small pieces of surgical tape) or Dermabond glue. The steri-strips/glue should begin to peel away within about a week (it is fine if the steri-strips fall off before then). If the strips are still in place one week after your surgery, you may gently remove them.  Diet           You may return to your usual diet. However, you may experience discomfort when swallowing in the first month after your surgery. This is normal. You may find that softer foods are more comfortable for you to swallow. Be sure to stay hydrated.  When  to Contact us  You may experience pain in your neck and/or pain between your shoulder blades. This is normal and should improve in the next few weeks with the help of pain medication, muscle relaxers, and rest. Some patients report that a warm compress on the back of the neck or between the shoulder blades helps.  However, should you experience any of the following, contact us immediately: New numbness or  weakness Pain that is progressively getting worse, and is not relieved by your pain medication, muscle relaxers, rest, and warm compresses Bleeding, redness, swelling, pain, or drainage from surgical incision Chills or flu-like symptoms Fever greater than 101.0 F (38.3 C) Inability to eat, drink fluids, or take medications Problems with bowel or bladder functions Difficulty breathing or shortness of breath Warmth, tenderness, or swelling in your calf Contact Information How to contact us:  If you have any questions/concerns before or after surgery, you can reach [place] at [phone], or you can send a mychart message. We can be reached by phone or mychart 8am-4pm, Monday-Friday.  *Please note: Calls after 4pm are forwarded to a third party answering service. Mychart messages are not routinely monitored during evenings, weekends, and holidays. Please call our office to contact the answering service for urgent concerns during non-business hours.

## 2023-03-10 NOTE — Telephone Encounter (Signed)
The patient called back and stated that she almost fell and her neck is hurting. She requested to be seen sooner than 03/15/23. Her appointment has been moved up to today. Nehemiah Settle will place a [facility] PT referral when she comes in today.   Per discussion with PA Nehemiah Settle and Dr Katrinka Blazing, she may need to be seen by her PCP regarding concerns of not eating, not exercising, sitting around all day, etc. I will route this call to her PCP (she has an appointment with her on 03/29/23). She is scheduled for an infusion on 03/12/23.

## 2023-03-10 NOTE — Telephone Encounter (Signed)
I received a call from [name], who came w/ Summer Murphy to her pre-op appointment. She reports she is not eating much (other than some applesauce and yogurt), she is not wearing her brace, and not doing any exercises. She feels the patient needs to be in rehab where someone can help her get her medication, exercise, etc.   She was supposed to go to [facility] for a brace adjustment yesterday, but she didn't go.   I explained that she wouldn't qualify for rehab admission from home. However, we can look into setting her up with home health PT. I also advised that if she is unwell (not eating, sitting around all day), that it may be wise to contact her PCP to discuss these things.  Upon review, it appears that PT recommended HHPT prior to discharge, but this was not set up.  I tried to call the patient to discuss the above, but the call went to voicemail and her voicemail is full.

## 2023-03-10 NOTE — Progress Notes (Signed)
   REFERRING PHYSICIAN:  [name], Np [address] [place],  [place] [address]  DOS: 03/02/23, C3-6 posterior cervical lami and fusion   HISTORY OF PRESENT ILLNESS: Summer Murphy is one week status post C3-6 cervical lami and fusion.  She comes in today concerned because last night she lost balance and had a fall.  As a result she is having some new neck pain.  She denies any new numbness and tingling down her arms.  She denies any new weakness.  She adds that the Flexeril that she was taking for pain caused her to hallucinate.  She also has only had a bowel movement once in 8 days.  She has not yet started physical therapy.  In addition, she is having trouble with the fitting of her cervical collar and missed her most recent appointment that was scheduled with the [facility].   PHYSICAL EXAMINATION:  NEUROLOGICAL:  General: In no acute distress.   Awake, alert, oriented to person, place, and time.  Pupils equal round and reactive to light.  Facial tone is symmetric.    Strength: Side Biceps Triceps Deltoid Interossei Grip Wrist Ext. Wrist Flex.  R 5 5 5  4+ 4+ 4+ 4+  L 5 5 5 4 4 4 4    Incision c/d/I. Honeycomb bandage removed.  Staples in place, minimal surrounding erythema.  No drainage.    Assessment / Plan: Summer Murphy is one week status post C3-6 cervical lami and fusion.  She comes in today concerned because last night she lost balance and had a fall.  As a result she is having some new neck pain.  She denies any new numbness and tingling down her arms.  She denies any new weakness.  She adds that the Flexeril that she was taking for pain caused her to hallucinate.  She also has only had a bowel movement once in 8 days.  She has not yet started physical therapy.  In addition, she is having trouble with the fitting of her cervical collar and missed her most recent appointment that was scheduled with the [facility].  Her incision is well-appearing.  Staples are in  place.  Examination is to baseline.  Plan:  -Stat cervical x-rays today due to a recent fall in the setting of recent surgery and new neck pain -Current cervical collar is ill fitting and too big.  Patient unfortunately missed recently scheduled appointment with [facility] to have it refitted.  New appointment was made with them today for smaller brace. -Referral to physical therapy placed with preference in patient's home. -Patient advised to no longer take Flexeril due to reports of hallucinations. -Patient was also advised to decrease oxycodone as it may be affecting her fall risk.   Advised to contact the office if any questions or concerns arise.   Summer Flores PA-C Dept of Neurosurgery

## 2023-03-12 ENCOUNTER — Telehealth: Payer: Self-pay | Admitting: Oncology

## 2023-03-12 ENCOUNTER — Inpatient Hospital Stay: Payer: 59

## 2023-03-12 NOTE — Telephone Encounter (Signed)
Per [name], pt is late to infusion and will need to r/s appt 03/12/23  I called pt but vmbx is full and could not leave a msg.

## 2023-03-15 ENCOUNTER — Encounter: Payer: 59 | Admitting: Physician Assistant

## 2023-03-18 ENCOUNTER — Telehealth: Payer: Self-pay | Admitting: Neurosurgery

## 2023-03-18 ENCOUNTER — Ambulatory Visit (INDEPENDENT_AMBULATORY_CARE_PROVIDER_SITE_OTHER): Payer: 59

## 2023-03-18 VITALS — Temp 98.4°F

## 2023-03-18 DIAGNOSIS — Z4802 Encounter for removal of sutures: Secondary | ICD-10-CM

## 2023-03-18 DIAGNOSIS — Z09 Encounter for follow-up examination after completed treatment for conditions other than malignant neoplasm: Secondary | ICD-10-CM

## 2023-03-18 DIAGNOSIS — G992 Myelopathy in diseases classified elsewhere: Secondary | ICD-10-CM

## 2023-03-18 MED ORDER — METHOCARBAMOL 500 MG PO TABS: 500.0000 mg | ORAL_TABLET | Freq: Three times a day (TID) | ORAL | 0 refills | Status: DC | PRN

## 2023-03-18 NOTE — Patient Instructions (Addendum)
-   Staples were removed today (03/18/23) - Do not put anything on your incision (ointments, creams, bandages, etc.) - Please notify us immediately if you notice increased redness/swelling at incision site, drainage from your incision, fever (temperature 100.4 or higher), chills. - OK to wash hair - OK to gently wash incision with mild soap and water; gently pat dry - Do NOT take flexeril - OK to take methocarbamol/robaxin (muscle relaxer) three times per day as needed; a refill has been sent to [facility] - OK to take tylenol as directed on the bottle - Can remove compression stockings when you are walking a couple hundred feet three times per day - Pain in the back of your neck and into your shoulder blades can be expected, and typically improves with time - [facility] notified us that they are out of network with [facility]. We are sending a new referral to [facility] [phone]) for physical therapy. They will call you to set up an evaluation.

## 2023-03-18 NOTE — Telephone Encounter (Signed)
Patient presented to office for incision check and staple removal.  [facility] informed us that they are out of network w/ [employer]. Referral has been sent to [facility].

## 2023-03-18 NOTE — Telephone Encounter (Signed)
C3-6 posterior cervical laminectomy and fusion on 03/02/23  Patient is calling that she is in a lot of pain. Her pain is at the back of her neck on the side of the incision. She does have pain on her incision. She had numbness in her right arm that goes and goes that started yesterday. This morning she states that she has little bit of numbness still. Also she that her cousin saw her incision yesterday and  told her it looks like raw meat. What should she do? I told her to please have some one take a picture and send it through [facility]. She will try. No fever, chills, and incision does not feel warm to the touch.

## 2023-03-18 NOTE — Progress Notes (Signed)
Ms Cid presented today due to concerns with her incision. Upon inspection, staples are present with some surrounding mild erythema. Dr Katrinka Blazing reviewed the picture of her incision and the staples were removed after thorough cleansing with chloraprep swab x 2.  She denies drainage from her incision. She does report that she has not washed her hair since surgery and she has been putting Vaseline on the incision. She was educated on how to clean her incision and to avoid putting anything on her incision.   She complains of persistent posterior neck pain. We discussed that this is not uncommon with the type of surgery she had. She has requested a refill of methocarbamol, which Dr Katrinka Blazing has agreed to. She will also continue tylenol and gabapentin.   Since her last visit, she has met with [facility] to have her brace refitted.  We were informed today that [facility] is out of network with her insurance for home health physical therapy. The referral will be sent to [facility].  She was instructed to contact our office immediately if she notices drainage, swelling, increased redness, fever, or chills. A copy of these instructions were printed and given to her. She will follow up as scheduled on 04/12/23, or sooner, if needed.   Sharlot Gowda, RN [facility] at [facility]

## 2023-03-19 ENCOUNTER — Inpatient Hospital Stay: Payer: 59

## 2023-03-22 ENCOUNTER — Telehealth (INDEPENDENT_AMBULATORY_CARE_PROVIDER_SITE_OTHER): Payer: 59 | Admitting: Physician Assistant

## 2023-03-22 ENCOUNTER — Other Ambulatory Visit: Payer: Self-pay

## 2023-03-22 ENCOUNTER — Telehealth: Payer: Self-pay | Admitting: Neurosurgery

## 2023-03-22 DIAGNOSIS — Z09 Encounter for follow-up examination after completed treatment for conditions other than malignant neoplasm: Secondary | ICD-10-CM

## 2023-03-22 DIAGNOSIS — G8918 Other acute postprocedural pain: Secondary | ICD-10-CM

## 2023-03-22 DIAGNOSIS — G8928 Other chronic postprocedural pain: Secondary | ICD-10-CM

## 2023-03-22 DIAGNOSIS — G992 Myelopathy in diseases classified elsewhere: Secondary | ICD-10-CM

## 2023-03-22 MED ORDER — ACETAMINOPHEN 500 MG PO TABS
1000.0000 mg | ORAL_TABLET | Freq: Four times a day (QID) | ORAL | 2 refills | Status: AC | PRN
Start: 2023-03-22 — End: 2024-03-21

## 2023-03-22 MED ORDER — DULOXETINE HCL 20 MG PO CPEP: 20.0000 mg | ORAL_CAPSULE | Freq: Every day | ORAL | 0 refills | Status: DC

## 2023-03-22 MED ORDER — OXYCODONE HCL 5 MG PO TABS: 5.0000 mg | ORAL_TABLET | Freq: Four times a day (QID) | ORAL | 0 refills | Status: DC | PRN

## 2023-03-22 NOTE — Telephone Encounter (Signed)
I called the patient to follow up on her pain. She states her pain is constant throughout her shoulders and neck. I followed up on her medication list. The patient states that she's taking oxycodone 5mg  every 12 hours however this medication was prescribed for every 6 hours. She is very concerned about her level of pain level. I had a further look at her chart and spoke with Odon PA with these concerns since pain seems to be an ongoing issue. Brooke PA called patient after our conversation to follow up with the patient.

## 2023-03-22 NOTE — Telephone Encounter (Signed)
Patient is 20 days postop from three-level posterior cervical fusion.  Complaining of severe pain despite Tylenol and oxycodone.  We discussed proper usage of his medication and importance of schedule.  More Tylenol and oxycodone as well as Cymbalta was sent to her local pharmacy.  She denies any new weakness, numbness, or tingling.  We discussed at length that posterior cervical spine surgery is painful and that over time this will improve.

## 2023-03-22 NOTE — Telephone Encounter (Signed)
Patient is calling to request an appointment sooner than 04/12/2023 with Dr. Katrinka Blazing. She states that she is in a lot of pain and is taking the medication as prescribed but is only getting at most an hour of relief with the medication. She also states that she is not resting. Please advise.

## 2023-03-23 ENCOUNTER — Telehealth: Payer: Self-pay | Admitting: Neurosurgery

## 2023-03-23 NOTE — Telephone Encounter (Signed)
Kim from [facility] let message while we were at lunch. She needs verbal order to start [facility] PT on 03/27/23.

## 2023-03-23 NOTE — Telephone Encounter (Signed)
Spoke with Selena Batten and gave her a verbal order for PT. She verbalized understanding.

## 2023-03-25 ENCOUNTER — Encounter: Payer: Self-pay | Admitting: Emergency Medicine

## 2023-03-25 ENCOUNTER — Other Ambulatory Visit: Payer: Self-pay

## 2023-03-25 ENCOUNTER — Telehealth: Payer: Self-pay | Admitting: Neurosurgery

## 2023-03-25 ENCOUNTER — Emergency Department
Admission: EM | Admit: 2023-03-25 | Discharge: 2023-03-25 | Disposition: A | Discharge status: Home / Self Care | Payer: 59 | Attending: Emergency Medicine | Admitting: Emergency Medicine

## 2023-03-25 DIAGNOSIS — E119 Type 2 diabetes mellitus without complications: Secondary | ICD-10-CM | POA: Insufficient documentation

## 2023-03-25 DIAGNOSIS — I10 Essential (primary) hypertension: Secondary | ICD-10-CM | POA: Insufficient documentation

## 2023-03-25 DIAGNOSIS — J449 Chronic obstructive pulmonary disease, unspecified: Secondary | ICD-10-CM | POA: Insufficient documentation

## 2023-03-25 DIAGNOSIS — M542 Cervicalgia: Secondary | ICD-10-CM | POA: Diagnosis present

## 2023-03-25 MED ORDER — TIZANIDINE HCL 4 MG PO TABS: 4.0000 mg | ORAL_TABLET | Freq: Three times a day (TID) | ORAL | 0 refills | Status: DC | PRN

## 2023-03-25 MED ORDER — TIZANIDINE HCL 2 MG PO TABS
4.0000 mg | ORAL_TABLET | Freq: Once | ORAL | Status: AC
  Administered 2023-03-25: 4 mg via ORAL
  Filled 2023-03-25: qty 2

## 2023-03-25 MED ORDER — HYDROMORPHONE HCL 1 MG/ML IJ SOLN
0.5000 mg | Freq: Once | INTRAMUSCULAR | Status: AC
  Administered 2023-03-25: 0.5 mg via INTRAMUSCULAR
  Filled 2023-03-25: qty 0.5

## 2023-03-25 NOTE — ED Provider Notes (Signed)
 [facility] Provider Note    Event Date/Time   First MD Initiated Contact with Patient 03/25/23 1011     (approximate)   History   Neck Injury   HPI  Summer Murphy is a 68 y.o. female with history of COPD, hypertension, diabetes and as listed in EMR presents to the emergency department for evaluation of neck pain post cervical fusion of 3-6 on December 10.  Patient states that she has been taking her prescribed medication without relief. Pain is no worse than usual and is no different in character. She is complaining of pain in the left side of her neck that goes down into the left arm.  She denies weakness.      Physical Exam   Triage Vital Signs: ED Triage Vitals [03/25/23 0929]  Encounter Vitals Group     BP 111/84     Systolic BP Percentile      Diastolic BP Percentile      Pulse Rate (!) 123     Resp 18     Temp 98.4 F (36.9 C)     Temp Source Oral     SpO2 96 %     Weight 134 lb (60.8 kg)     Height 5' 3 (1.6 m)     Head Circumference      Peak Flow      Pain Score 10     Pain Loc      Pain Education      Exclude from Growth Chart     Most recent vital signs: Vitals:   03/25/23 0929 03/25/23 1123  BP: 111/84 139/75  Pulse: (!) 123 (!) 119  Resp: 18 18  Temp: 98.4 F (36.9 C)   SpO2: 96% 100%    General: Awake, no distress.  CV:  Good peripheral perfusion.  Resp:  Normal effort.  Abd:  No distention.  Other:  Motor strength in upper extremities 5/5; grip strength is equal.   Surgical site well approximated with mild local erythema.   ED Results / Procedures / Treatments   Labs (all labs ordered are listed, but only abnormal results are displayed) Labs Reviewed - No data to display   EKG  Not indicated.   RADIOLOGY  Image and radiology report reviewed and interpreted by me. Radiology report consistent with the same.  Not indicated.  PROCEDURES:  Critical Care performed:  No  Procedures   MEDICATIONS ORDERED IN ED:  Medications  HYDROmorphone  (DILAUDID ) injection 0.5 mg (0.5 mg Intramuscular Given 03/25/23 1041)  tiZANidine  (ZANAFLEX ) tablet 4 mg (4 mg Oral Given 03/25/23 1226)     IMPRESSION / MDM / ASSESSMENT AND PLAN / ED COURSE   I have reviewed the triage note.  Differential diagnosis includes, but is not limited to, postoperative pain, neuropathy, myelopathy.  Patient's presentation is most consistent with exacerbation of chronic illness.  68 year old female presenting to the emergency department for treatment and evaluation of postsurgical neck pain.  See HPI for further details.  Patient arrives in a stiff cervical collar that is very poorly fitted.  Exam is most consistent with muscular pain.  She has no weakness in the upper extremities and has equal grip strength bilaterally.  She reports last having taken her pain medication at 7 PM last night.  ----------------------------------------- 11:19 AM on 03/25/2023 ----------------------------------------- Patient states that she has had no improvement after IM Dilaudid . Plan will be to recheck vital signs and consider additional medications.  ----------------------------------------- 11:53 AM  on 03/25/2023 ----------------------------------------- Patient placed in a soft cervical collar that appears to be a much better fit.  She continues to complain of pain.  Vital signs are stable.  Zanaflex  ordered.   ----------------------------------------- 1:18 PM on 03/25/2023 ----------------------------------------- Patient reports pain has improved and she appears to be more comfortable.  She was instructed to stop the Robaxin /methocarbamol  if she plans to take the Zanaflex .  She was instructed to only take her pain medication as frequently as prescribed.  She was instructed to call and schedule follow-up appointment with her neurosurgeon.  If symptoms change or worsen and she is unable to be  evaluated by the specialist, she is to return to the emergency department.      FINAL CLINICAL IMPRESSION(S) / ED DIAGNOSES   Final diagnoses:  Neck pain, musculoskeletal     Rx / DC Orders   ED Discharge Orders          Ordered    tiZANidine  (ZANAFLEX ) 4 MG tablet  Every 8 hours PRN        03/25/23 1316             Note:  This document was prepared using Dragon voice recognition software and may include unintentional dictation errors.   Summer Kirk NOVAK, FNP 03/25/23 1320    Levander Slate, MD 03/25/23 [phone]

## 2023-03-25 NOTE — Discharge Instructions (Signed)
 You need to stop taking the methocarbamol  while taking tizanidine .  Take your other medications only as prescribed.  Follow-up with your neurosurgeon if symptoms are not improving.  If symptoms change or worsen and you are unable to schedule an appointment, please return to the emergency department.

## 2023-03-25 NOTE — Telephone Encounter (Signed)
 C3-6 posterior cervical laminectomy and fusion on 03/02/23  Patient is calling that she is having excruciating neck pain and her right arm is completely numb. What should she do? She is taking her pain medication has directed. She is asking to see him today. She is aware that he is not in the office this week. Can the nurse please call her.

## 2023-03-25 NOTE — ED Triage Notes (Signed)
 Patient to ED from the pain clinic for neck and left shoulder pain. Pt states she had neck surgery on 12/10- having pain since surgery. Came to ED in c-collar. PT states she was sent from pain clinic due to them not being opened today. States she did not take pain meds today because they haven't been helping. Denies any new injures.

## 2023-03-25 NOTE — Telephone Encounter (Signed)
 I attempted to call patient twice to follow up on her call this morning. Her phone rang to voicemail and then notified me that her mailbox was full. I will try sending her a my chart message to see if I can get her to give me a call so I can get more information about her concerns this morning.

## 2023-03-26 ENCOUNTER — Ambulatory Visit
Admission: RE | Admit: 2023-03-26 | Discharge: 2023-03-26 | Disposition: A | Discharge status: Home / Self Care | Payer: 59 | Source: Ambulatory Visit | Attending: Physician Assistant | Admitting: Physician Assistant

## 2023-03-26 ENCOUNTER — Other Ambulatory Visit: Payer: Self-pay

## 2023-03-26 ENCOUNTER — Inpatient Hospital Stay: Payer: 59 | Attending: Oncology

## 2023-03-26 VITALS — BP 104/54 | HR 66 | Temp 97.0°F | Resp 16

## 2023-03-26 DIAGNOSIS — R5383 Other fatigue: Secondary | ICD-10-CM | POA: Insufficient documentation

## 2023-03-26 DIAGNOSIS — Z8719 Personal history of other diseases of the digestive system: Secondary | ICD-10-CM | POA: Insufficient documentation

## 2023-03-26 DIAGNOSIS — Z981 Arthrodesis status: Secondary | ICD-10-CM | POA: Insufficient documentation

## 2023-03-26 DIAGNOSIS — M4802 Spinal stenosis, cervical region: Secondary | ICD-10-CM | POA: Insufficient documentation

## 2023-03-26 DIAGNOSIS — K5900 Constipation, unspecified: Secondary | ICD-10-CM | POA: Insufficient documentation

## 2023-03-26 DIAGNOSIS — D509 Iron deficiency anemia, unspecified: Secondary | ICD-10-CM | POA: Diagnosis present

## 2023-03-26 DIAGNOSIS — Z803 Family history of malignant neoplasm of breast: Secondary | ICD-10-CM | POA: Insufficient documentation

## 2023-03-26 DIAGNOSIS — G992 Myelopathy in diseases classified elsewhere: Secondary | ICD-10-CM

## 2023-03-26 DIAGNOSIS — M5010 Cervical disc disorder with radiculopathy, unspecified cervical region: Secondary | ICD-10-CM | POA: Insufficient documentation

## 2023-03-26 MED ORDER — IRON SUCROSE 20 MG/ML IV SOLN
200.0000 mg | Freq: Once | INTRAVENOUS | Status: AC
  Administered 2023-03-26: 200 mg via INTRAVENOUS
  Filled 2023-03-26: qty 10

## 2023-03-26 MED ORDER — DIPHENHYDRAMINE HCL 50 MG/ML IJ SOLN
25.0000 mg | Freq: Once | INTRAMUSCULAR | Status: AC
  Administered 2023-03-26: 25 mg via INTRAVENOUS
  Filled 2023-03-26: qty 1

## 2023-03-26 NOTE — Telephone Encounter (Signed)
 Xrays have been ordered.

## 2023-03-26 NOTE — Patient Instructions (Signed)
 Iron Sucrose Injection What is this medication? IRON SUCROSE (EYE ern SOO krose) treats low levels of iron (iron deficiency anemia) in people with kidney disease. Iron is a mineral that plays an important role in making red blood cells, which carry oxygen from your lungs to the rest of your body. This medicine may be used for other purposes; ask your health care provider or pharmacist if you have questions. COMMON BRAND NAME(S): Venofer What should I tell my care team before I take this medication? They need to know if you have any of these conditions: Anemia not caused by low iron levels Heart disease High levels of iron in the blood Kidney disease Liver disease An unusual or allergic reaction to iron, other medications, foods, dyes, or preservatives Pregnant or trying to get pregnant Breastfeeding How should I use this medication? This medication is for infusion into a vein. It is given in a hospital or clinic setting. Talk to your care team about the use of this medication in children. While this medication may be prescribed for children as young as 2 years for selected conditions, precautions do apply. Overdosage: If you think you have taken too much of this medicine contact a poison control center or emergency room at once. NOTE: This medicine is only for you. Do not share this medicine with others. What if I miss a dose? Keep appointments for follow-up doses. It is important not to miss your dose. Call your care team if you are unable to keep an appointment. What may interact with this medication? Do not take this medication with any of the following: Deferoxamine Dimercaprol Other iron products This medication may also interact with the following: Chloramphenicol Deferasirox This list may not describe all possible interactions. Give your health care provider a list of all the medicines, herbs, non-prescription drugs, or dietary supplements you use. Also tell them if you smoke,  drink alcohol, or use illegal drugs. Some items may interact with your medicine. What should I watch for while using this medication? Visit your care team regularly. Tell your care team if your symptoms do not start to get better or if they get worse. You may need blood work done while you are taking this medication. You may need to follow a special diet. Talk to your care team. Foods that contain iron include: whole grains/cereals, dried fruits, beans, or peas, leafy green vegetables, and organ meats (liver, kidney). What side effects may I notice from receiving this medication? Side effects that you should report to your care team as soon as possible: Allergic reactions--skin rash, itching, hives, swelling of the face, lips, tongue, or throat Low blood pressure--dizziness, feeling faint or lightheaded, blurry vision Shortness of breath Side effects that usually do not require medical attention (report to your care team if they continue or are bothersome): Flushing Headache Joint pain Muscle pain Nausea Pain, redness, or irritation at injection site This list may not describe all possible side effects. Call your doctor for medical advice about side effects. You may report side effects to FDA at 1-800-FDA-1088. Where should I keep my medication? This medication is given in a hospital or clinic. It will not be stored at home. NOTE: This sheet is a summary. It may not cover all possible information. If you have questions about this medicine, talk to your doctor, pharmacist, or health care provider.  2024 Elsevier/Gold Standard (2022-08-14 00:00:00)

## 2023-03-26 NOTE — Telephone Encounter (Signed)
 She did not return our calls yesterday. She went to the ER on 03/25/23. Please try calling her today to offer her to come in to see Dr Katrinka Blazing sooner than 04/12/23 if she would like to do so. (She will need xrays).

## 2023-03-29 ENCOUNTER — Ambulatory Visit (INDEPENDENT_AMBULATORY_CARE_PROVIDER_SITE_OTHER): Payer: 59 | Admitting: Neurosurgery

## 2023-03-29 ENCOUNTER — Encounter: Payer: Self-pay | Admitting: Neurosurgery

## 2023-03-29 ENCOUNTER — Ambulatory Visit (INDEPENDENT_AMBULATORY_CARE_PROVIDER_SITE_OTHER): Payer: 59 | Admitting: Nurse Practitioner

## 2023-03-29 ENCOUNTER — Encounter: Payer: Self-pay | Admitting: Nurse Practitioner

## 2023-03-29 VITALS — BP 104/60 | Temp 99.2°F | Ht 63.0 in | Wt 134.0 lb

## 2023-03-29 VITALS — BP 110/66 | HR 100 | Temp 98.6°F | Resp 16 | Ht 63.0 in | Wt 126.6 lb

## 2023-03-29 DIAGNOSIS — G992 Myelopathy in diseases classified elsewhere: Secondary | ICD-10-CM

## 2023-03-29 DIAGNOSIS — Z9889 Other specified postprocedural states: Secondary | ICD-10-CM | POA: Diagnosis not present

## 2023-03-29 DIAGNOSIS — F418 Other specified anxiety disorders: Secondary | ICD-10-CM

## 2023-03-29 DIAGNOSIS — M542 Cervicalgia: Secondary | ICD-10-CM | POA: Diagnosis not present

## 2023-03-29 DIAGNOSIS — M5412 Radiculopathy, cervical region: Secondary | ICD-10-CM

## 2023-03-29 DIAGNOSIS — Z981 Arthrodesis status: Secondary | ICD-10-CM

## 2023-03-29 DIAGNOSIS — M4802 Spinal stenosis, cervical region: Secondary | ICD-10-CM

## 2023-03-29 MED ORDER — TIZANIDINE HCL 4 MG PO TABS: 4.0000 mg | ORAL_TABLET | Freq: Three times a day (TID) | ORAL | 0 refills | Status: DC | PRN

## 2023-03-29 MED ORDER — CLONAZEPAM 1 MG PO TABS: 0.5000 mg | ORAL_TABLET | Freq: Two times a day (BID) | ORAL | 0 refills | Status: DC | PRN

## 2023-03-29 MED ORDER — OXYCODONE HCL 5 MG PO TABS: 5.0000 mg | ORAL_TABLET | Freq: Four times a day (QID) | ORAL | 0 refills | Status: AC | PRN

## 2023-03-29 MED ORDER — CELECOXIB 100 MG PO CAPS: 100.0000 mg | ORAL_CAPSULE | Freq: Two times a day (BID) | ORAL | 3 refills | Status: DC

## 2023-03-29 NOTE — Progress Notes (Signed)
 [facility] [address] [place], [place] [address]  Internal MEDICINE  Office Visit Note  Patient Name: Summer Murphy  [id number]  [id number]  Date of Service: 03/29/2023  Chief Complaint  Patient presents with   Diabetes   Gastroesophageal Reflux   Hypertension   Follow-up    HPI Mannat presents for a follow-up visit for recent neck surgery, continued neck pain, diarrhea that started today, anxiety and increasing forgetfulness. Recent neck surgery -- having neck pain, not controlled, went to ED and was given tizanidine  to take along with the oxycodone . Now also going to physical therapy.  Diarrhea -- started today, also having a lot of sinus issues. May have been something she ate or could be a viral gastritis Anxiety -- currently taking duloxetine , buspirone , trazodone , gabapentin , and prn klonopin .  Increasing forgetfulness -- possible side effect of klonopin .  Has a lot of drug allergies listed but is not sure about any of them. Has NSAIDs listed as an allergy for a rash but is not sure of which NSAID she had a reaction to and has taken ibuprofen  with no issues. Patient is willing to try celebrex  and see how she does with it.     Current Medication: Outpatient Encounter Medications as of 03/29/2023  Medication Sig   Accu-Chek Softclix Lancets lancets Use as instructed once a daily DX R730.01   acetaminophen  (TYLENOL ) 500 MG tablet Take 2 tablets (1,000 mg total) by mouth every 6 (six) hours as needed.   alendronate  (FOSAMAX ) 70 MG tablet Take 1 tablet (70 mg total) by mouth once a week. Take with a full glass of water  on an empty stomach.   Blood Glucose Monitoring Suppl (ACCU-CHEK GUIDE ME) w/Device KIT Use as directed once a day DX R730.01   busPIRone  (BUSPAR ) 10 MG tablet Take 1 tablet (10 mg total) by mouth 2 (two) times daily.   celecoxib  (CELEBREX ) 100 MG capsule Take 1 capsule (100 mg total) by mouth 2 (two) times daily.   COMBIVENT  RESPIMAT 20-100  MCG/ACT AERS respimat INHALE 1 PUFF BY MOUTH EVERY 6 HOURS   diclofenac  Sodium (VOLTAREN ) 1 % GEL Apply 4 g topically 4 (four) times daily. (Patient taking differently: Apply 4 g topically 4 (four) times daily as needed (pain).)   diltiazem  (CARDIZEM  CD) 240 MG 24 hr capsule TAKE ONE CAPSULE BY MOUTH DAILY   DULoxetine  (CYMBALTA ) 60 MG capsule Take 1 capsule (60 mg total) by mouth at bedtime.   fluticasone -salmeterol (WIXELA INHUB) 250-50 MCG/ACT AEPB Inhale 1 puff into the lungs in the morning and at bedtime.   gabapentin  (NEURONTIN ) 300 MG capsule TAKE 1 CAPSULE(300 MG) BY MOUTH TWICE DAILY   gabapentin  (NEURONTIN ) 600 MG tablet TAKE 1 TABLET(600 MG) BY MOUTH TWICE DAILY   glucose blood (ACCU-CHEK GUIDE) test strip Use as instructed once daily Dx R73.01   ipratropium-albuterol  (DUONEB) 0.5-2.5 (3) MG/3ML SOLN Take 3 mLs by nebulization 4 (four) times daily.   lidocaine  (XYLOCAINE ) 2 % solution Use as directed 10 mLs in the mouth or throat every 6 (six) hours as needed for mouth pain.   montelukast  (SINGULAIR ) 10 MG tablet TAKE 1 TABLET(10 MG) BY MOUTH AT BEDTIME   ondansetron  (ZOFRAN ) 4 MG tablet TAKE 1 TABLET BY MOUTH TWICE DAILY AS NEEDED   oxyCODONE  (OXY IR/ROXICODONE ) 5 MG immediate release tablet Take 1 tablet (5 mg total) by mouth every 6 (six) hours as needed for up to 14 days for severe pain (pain score 7-10).   pantoprazole  (PROTONIX )  40 MG tablet Take 40 mg by mouth 2 (two) times daily.   rOPINIRole  (REQUIP ) 0.5 MG tablet TAKE 2 TABLETS BY MOUTH TWICE DAILY FOR RESTLESS LEGS   senna (SENOKOT) 8.6 MG TABS tablet Take 1 tablet (8.6 mg total) by mouth 2 (two) times daily as needed for mild constipation.   SUMAtriptan  (IMITREX ) 100 MG tablet Take 1 tablet (100 mg total) by mouth every 2 (two) hours as needed (for migraine headaches.). May repeat in 1 hours if headache persists or recurs.   tirzepatide  (MOUNJARO ) 2.5 MG/0.5ML Pen Inject 2.5 mg into the skin once a week.   tiZANidine   (ZANAFLEX ) 4 MG tablet Take 1 tablet (4 mg total) by mouth every 8 (eight) hours as needed for up to 14 days for muscle spasms.   traZODone  (DESYREL ) 150 MG tablet TAKE 1 TABLET(150 MG) BY MOUTH AT BEDTIME   [DISCONTINUED] clonazePAM  (KLONOPIN ) 1 MG tablet Take 0.5-1 tablets (0.5-1 mg total) by mouth 2 (two) times daily as needed for anxiety.   clonazePAM  (KLONOPIN ) 1 MG tablet Take 0.5-1 tablets (0.5-1 mg total) by mouth 2 (two) times daily as needed for anxiety.   [DISCONTINUED] acetaminophen  (TYLENOL ) 650 MG CR tablet Take 1,300 mg by mouth every 8 (eight) hours as needed for pain.   [DISCONTINUED] DULoxetine  (CYMBALTA ) 20 MG capsule Take 1 capsule (20 mg total) by mouth daily. (Patient not taking: Reported on 03/29/2023)   [DISCONTINUED] fluticasone  (FLONASE ) 50 MCG/ACT nasal spray Place 1 spray into both nostrils daily. (Patient not taking: Reported on 03/29/2023)   [DISCONTINUED] oxyCODONE  (OXY IR/ROXICODONE ) 5 MG immediate release tablet Take 1 tablet (5 mg total) by mouth every 6 (six) hours as needed for severe pain (pain score 7-10).   [DISCONTINUED] tiZANidine  (ZANAFLEX ) 4 MG tablet Take 1 tablet (4 mg total) by mouth every 8 (eight) hours as needed for muscle spasms.   No facility-administered encounter medications on file as of 03/29/2023.    Surgical History: Past Surgical History:  Procedure Laterality Date   ABDOMINAL HYSTERECTOMY     AMPUTATION TOE Left 07/31/2016   Procedure: AMPUTATION TOE/MPJ 2nd toe;  Surgeon: Neill Boas, DPM;  Location: [facility] ORS;  Service: Podiatry;  Laterality: Left;   APPENDECTOMY  1990   BACK SURGERY     low back   BREAST SURGERY     bilateral breast reduction   CARDIAC ELECTROPHYSIOLOGY STUDY AND ABLATION     CHOLECYSTECTOMY  1990   COLONOSCOPY WITH PROPOFOL  N/A 01/04/2017   Procedure: COLONOSCOPY WITH PROPOFOL ;  Surgeon: Viktoria Lamar DASEN, MD;  Location: [facility] ENDOSCOPY;  Service: Endoscopy;  Laterality: N/A;   CORNEAL TRANSPLANT      ESOPHAGOGASTRODUODENOSCOPY N/A 04/09/2021   Procedure: ESOPHAGOGASTRODUODENOSCOPY (EGD);  Surgeon: Unk Corinn Skiff, MD;  Location: [facility] ENDOSCOPY;  Service: Gastroenterology;  Laterality: N/A;   ESOPHAGOGASTRODUODENOSCOPY (EGD) WITH PROPOFOL  N/A 01/04/2017   Procedure: ESOPHAGOGASTRODUODENOSCOPY (EGD) WITH PROPOFOL ;  Surgeon: Viktoria Lamar DASEN, MD;  Location: [facility] ENDOSCOPY;  Service: Endoscopy;  Laterality: N/A;   EXCISION BONE CYST Left 07/31/2016   Procedure: EXCISION BONE CYST/exostectomy 28124/left 2nd;  Surgeon: Neill Boas, DPM;  Location: [facility];  Service: Podiatry;  Laterality: Left;   EXTRACORPOREAL SHOCK WAVE LITHOTRIPSY Left 09/12/2015   Procedure: EXTRACORPOREAL SHOCK WAVE LITHOTRIPSY (ESWL);  Surgeon: Rosina Riis, MD;  Location: [facility];  Service: Urology;  Laterality: Left;   FRACTURE SURGERY     left foot   GUM SURGERY Left    gum infection   HARDWARE REMOVAL Left 11/14/2020   Procedure: HARDWARE  REMOVAL;  Surgeon: Kathlynn Sharper, MD;  Location: [facility] OR[facility];  Service: Orthopedics;  Laterality: Left;   HH repair     Fundoplication   JOINT REPLACEMENT Bilateral 2013,2014   total knees   LAPAROSCOPIC HYSTERECTOMY     LITHOTRIPSY     periprosthetic supracondylar fracture of left femur  02/16/2020   [facility]   POSTERIOR CERVICAL FUSION/FORAMINOTOMY N/A 03/02/2023   Procedure: C3-6 POSTERIOR CERVICAL LAMINECTOMY AND FUSION;  Surgeon: Claudene Penne ORN, MD;  Location: [facility] ORS;  Service: Neurosurgery;  Laterality: N/A;   TONSILLECTOMY     TOTAL KNEE REVISION Right 11/14/2019   Procedure: Revision patella and tibial polyethylene;  Surgeon: Kathlynn Sharper, MD;  Location: [facility] ORS;  Service: Orthopedics;  Laterality: Right;   URETEROSCOPY      Medical History: Past Medical History:  Diagnosis Date   Acid reflux    Anemia    Anxiety    a.) buspirone  BID + BZO PRN (clonazepam )   Arthritis    Asthma    Cervical disc disorder with radiculopathy of cervical region     Chronic pain syndrome    a.) on COT as prescribed by pain mangement   Chronic, continuous use of opioids    a.) followed by pain mangement   COPD (chronic obstructive pulmonary disease) (HCC)    DOE (dyspnea on exertion)    Hematuria    Hiatal hernia    History of kidney stones    Hypertension    Hypoglycemia    Insomnia    a.) uses Trazodone  PRN   LBBB (left bundle branch block)    Migraine    Murmur    Osteopenia    Palpitations    Pre-diabetes    Restless leg    a.) on ropinirole    Stenosis of cervical spine with myelopathy (HCC)    T2DM (type 2 diabetes mellitus) (HCC)     Family History: Family History  Problem Relation Age of Onset   Hypertension Mother    Stroke Mother    Prostate cancer Father    Kidney Stones Father    Diabetes Brother    Hypertension Brother    Breast cancer Maternal Aunt    Kidney disease Neg Hx     Social History   Socioeconomic History   Marital status: Widowed    Spouse name: Not on file   Number of children: Not on file   Years of education: Not on file   Highest education level: Not on file  Occupational History   Not on file  Tobacco Use   Smoking status: Never    Passive exposure: Past   Smokeless tobacco: Never  Vaping Use   Vaping status: Never Used  Substance and Sexual Activity   Alcohol use: No   Drug use: No   Sexual activity: Not on file  Other Topics Concern   Not on file  Social History Narrative   Lives at home alone. Relatives are the support person.    Social Drivers of Corporate Investment Banker Strain: Not on file  Food Insecurity: No Food Insecurity (03/02/2023)   Hunger Vital Sign    Worried About Running Out of Food in the Last Year: Never true    Ran Out of Food in the Last Year: Never true  Transportation Needs: No Transportation Needs (03/02/2023)   PRAPARE - Administrator, Civil Service (Medical): No    Lack of Transportation (Non-Medical): No  Physical Activity: Not on file  Stress: Not on file  Social Connections: Not on file  Intimate Partner Violence: Not At Risk (03/02/2023)   Humiliation, Afraid, Rape, and Kick questionnaire    Fear of Current or Ex-Partner: No    Emotionally Abused: No    Physically Abused: No    Sexually Abused: No      Review of Systems  Constitutional:  Negative for fatigue and fever.  HENT:  Negative for congestion, mouth sores and postnasal drip.   Respiratory: Negative.  Negative for cough, chest tightness, shortness of breath and wheezing.   Cardiovascular: Negative.  Negative for chest pain and palpitations.  Genitourinary:  Negative for flank pain.  Musculoskeletal:  Positive for arthralgias, back pain and neck pain.  Neurological:        Forgetfulness  Psychiatric/Behavioral:  Positive for behavioral problems and sleep disturbance. Negative for self-injury and suicidal ideas. The patient is nervous/anxious.     Vital Signs: BP 110/66   Pulse 100   Temp 98.6 F (37 C)   Resp 16   Ht 5' 3 (1.6 m)   Wt 126 lb 9.6 oz (57.4 kg)   SpO2 95%   BMI 22.43 kg/m    Physical Exam Vitals reviewed.  Constitutional:      General: She is not in acute distress.    Appearance: Normal appearance. She is normal weight. She is not ill-appearing.  HENT:     Head: Normocephalic and atraumatic.  Eyes:     Pupils: Pupils are equal, round, and reactive to light.  Cardiovascular:     Rate and Rhythm: Normal rate and regular rhythm.  Pulmonary:     Effort: Pulmonary effort is normal. No respiratory distress.  Neurological:     Mental Status: She is alert and oriented to person, place, and time.  Psychiatric:        Mood and Affect: Mood normal.        Behavior: Behavior normal.        Assessment/Plan: 1. Neck pain with history of cervical spinal surgery (Primary) Start celebrex  tomorrow. If she starts to develop a rash, she will stop taking celebrex . If no issues, she will continue as prescribed.  - celecoxib   (CELEBREX ) 100 MG capsule; Take 1 capsule (100 mg total) by mouth 2 (two) times daily.  Dispense: 60 capsule; Refill: 3  2. Cervical radiculopathy Start celebrex  tomorrow.  - celecoxib  (CELEBREX ) 100 MG capsule; Take 1 capsule (100 mg total) by mouth 2 (two) times daily.  Dispense: 60 capsule; Refill: 3  3. Situational anxiety Working on weaning off klonopin , patient is decreasing to 1 tablet in the morning and 1/2 tablet in the evening starting tonight. Will follow up in 2 weeks and wean down further depending on how she is tolerating the decrease so far.  - clonazePAM  (KLONOPIN ) 1 MG tablet; Take 0.5-1 tablets (0.5-1 mg total) by mouth 2 (two) times daily as needed for anxiety.  Dispense: 60 tablet; Refill: 0   General Counseling: Chelcea verbalizes understanding of the findings of todays visit and agrees with plan of treatment. I have discussed any further diagnostic evaluation that may be needed or ordered today. We also reviewed her medications today. she has been encouraged to call the office with any questions or concerns that should arise related to todays visit.    No orders of the defined types were placed in this encounter.   Meds ordered this encounter  Medications   celecoxib  (CELEBREX ) 100 MG capsule    Sig: Take 1  capsule (100 mg total) by mouth 2 (two) times daily.    Dispense:  60 capsule    Refill:  3    Discussed allergies with patient and patient has not had celebrex  before. Will try celebrex    clonazePAM  (KLONOPIN ) 1 MG tablet    Sig: Take 0.5-1 tablets (0.5-1 mg total) by mouth 2 (two) times daily as needed for anxiety.    Dispense:  60 tablet    Refill:  0    Note change in dose and number of tablets, please fill new script today.    Return in about 2 weeks (around 04/12/2023) for F/U, Berania Peedin PCP -- clonazepam  weaning and new NSAID.   Total time spent:30 Minutes Time spent includes review of chart, medications, test results, and follow up plan with the  patient.    Controlled Substance Database was reviewed by me.  This patient was seen by Mardy Maxin, FNP-C in collaboration with Dr. Sigrid Bathe as a part of collaborative care agreement.   Lavita Pontius R. Maxin, MSN, FNP-C Internal medicine

## 2023-03-29 NOTE — Progress Notes (Addendum)
   REFERRING PHYSICIAN:  Liana Fish, Np [address] [place],  [place] [address]  DOS: 03/02/23, C3-6 posterior cervical lami and fusion   HISTORY OF PRESENT ILLNESS: Summer Murphy is almost 1 month status post C3-6 cervical lami and fusion.  She states that she continues to have significant neck pain.  Also has waking pain in her left upper extremity.  This is not getting any worse but is not getting significantly better.  She is currently taking her oxycodone , feels like tizanidine  has given her the most relief and has just recently started that.  She has not yet started physical therapy.  She did not have a good fit on her previous collar but obtained a soft collar and is able to wear this more.  She does state that she often will overdo it by moving her recliner or bending over to pick things up off the floor which causes flares.  Her wound is healing.  PHYSICAL EXAMINATION:  NEUROLOGICAL:  General: In no acute distress.   Awake, alert, oriented to person, place, and time.  Pupils equal round and reactive to light.  Facial tone is symmetric.    Strength: Side Biceps Triceps Deltoid Interossei Grip Wrist Ext. Wrist Flex.  R 5 5 5  4+ 4+ 4+ 4+  L 5 5 5 4 4 4 4    Incision c/d/I. Healing well minimal surrounding erythema.  No drainage.    Assessment / Plan: THOMAS RHUDE is one month status post C3-6 cervical lami and fusion.  She has had difficulty with pain management postoperatively.  She continues on her oxycodone , we have recently changed her muscle relaxer to tizanidine .  She has not yet taken any anti-inflammatories which I encouraged her to consider.  She has not yet started physical therapy which I also encouraged her to start.  We got repeat x-rays today which show good placement of herhardware.  She does have a cervical kyphosis that appears to be functional.  No evidence of adjacent level disease or hardware failure.  I discussed with her the difficult recovery that  is involved in posterior cervical surgery and that she is continuing to do as expected for being less than 1 month out.  The waking pains in her left upper extremity are likely related to the myomalacia that she had preoperatively and this can often cause with a recurrence or worsening of pain after decompression.  Her neck pain continues to be from the muscle dissection that was performed to get the hardware fusion.  We let her know that physical therapy, anti-inflammatories, rest and avoiding heavy lifting will be beneficial.  We have encouraged her not to move her couch on her own that she has been doing to find different places to sit in the house.  We have refilled her medications, let her know that her pain medication can only be refilled up to 1 month and she is approaching that time.  I refilled her muscle relaxants as she has had a good response to those from the emergency department.  I also encouraged her to start an anti-inflammatory.  She has a follow-up appointment with her PCP today.  Advised to contact the office if any questions or concerns arise.   Penne ORN MD Dept of Neurosurgery

## 2023-03-30 ENCOUNTER — Other Ambulatory Visit: Payer: Self-pay | Admitting: Nurse Practitioner

## 2023-03-30 ENCOUNTER — Encounter: Payer: Self-pay | Admitting: Nurse Practitioner

## 2023-03-30 DIAGNOSIS — Z79899 Other long term (current) drug therapy: Secondary | ICD-10-CM

## 2023-03-31 ENCOUNTER — Other Ambulatory Visit: Payer: Self-pay | Admitting: Nurse Practitioner

## 2023-03-31 DIAGNOSIS — M064 Inflammatory polyarthropathy: Secondary | ICD-10-CM

## 2023-03-31 DIAGNOSIS — E119 Type 2 diabetes mellitus without complications: Secondary | ICD-10-CM

## 2023-03-31 DIAGNOSIS — Z79899 Other long term (current) drug therapy: Secondary | ICD-10-CM

## 2023-03-31 MED ORDER — BUSPIRONE HCL 10 MG PO TABS: 10.0000 mg | ORAL_TABLET | Freq: Two times a day (BID) | ORAL | 2 refills | Status: DC

## 2023-04-01 ENCOUNTER — Telehealth: Payer: Self-pay | Admitting: Neurosurgery

## 2023-04-01 ENCOUNTER — Telehealth: Payer: Self-pay

## 2023-04-01 MED ORDER — DILTIAZEM HCL ER COATED BEADS 120 MG PO CP24: 120.0000 mg | ORAL_CAPSULE | Freq: Every day | ORAL | 5 refills | Status: DC

## 2023-04-01 NOTE — Telephone Encounter (Signed)
 I spoke with the patient. She reports that she fell on left arm and leg. She did not hit her neck. She has been in contact with her PCP about her low blood pressure. I encouraged her to keep in contact with her PCP about her blood pressure and to seek care with her PCP or an urgent care if her arm and leg are bothering her. She inquired about an xray of her neck. I discussed this with Dr Claudene and informed her that we do not need an xray of her neck at this time since she did not hit her neck during the fall.

## 2023-04-01 NOTE — Telephone Encounter (Signed)
 Patient is calling in to let our office know that her blood pressure dropped last night and she fell on her arm. She states that about an hour after she fell, her blood pressure was 70/57. She finally states that her PCP is changing her blood pressure medication. She would like to know what she needs to do.

## 2023-04-01 NOTE — Telephone Encounter (Signed)
 Mark from [facility] called to also report this fall on our voicemail. States that the patient became dizzy but she was able to get up by herself.   Patients PCP also cut her DILTIAZEM  dosage in half now taking 120MG   Any questions please call One[facility]- [phone]

## 2023-04-01 NOTE — Telephone Encounter (Signed)
 Pt advised that alyssa change med  and keep track for bp reading

## 2023-04-02 ENCOUNTER — Telehealth: Payer: Self-pay | Admitting: Neurosurgery

## 2023-04-02 ENCOUNTER — Ambulatory Visit
Admission: RE | Admit: 2023-04-02 | Discharge: 2023-04-02 | Disposition: A | Discharge status: Home / Self Care | Payer: 59 | Attending: Physician Assistant | Admitting: Physician Assistant

## 2023-04-02 ENCOUNTER — Other Ambulatory Visit: Payer: Self-pay | Admitting: Nurse Practitioner

## 2023-04-02 ENCOUNTER — Ambulatory Visit
Admission: RE | Admit: 2023-04-02 | Discharge: 2023-04-02 | Disposition: A | Discharge status: Home / Self Care | Payer: 59 | Source: Ambulatory Visit | Attending: Physician Assistant | Admitting: Physician Assistant

## 2023-04-02 ENCOUNTER — Other Ambulatory Visit: Payer: Self-pay | Admitting: Physician Assistant

## 2023-04-02 DIAGNOSIS — Z9889 Other specified postprocedural states: Secondary | ICD-10-CM

## 2023-04-02 DIAGNOSIS — Z79899 Other long term (current) drug therapy: Secondary | ICD-10-CM

## 2023-04-02 DIAGNOSIS — Z760 Encounter for issue of repeat prescription: Secondary | ICD-10-CM

## 2023-04-02 DIAGNOSIS — F418 Other specified anxiety disorders: Secondary | ICD-10-CM

## 2023-04-02 DIAGNOSIS — F3342 Major depressive disorder, recurrent, in full remission: Secondary | ICD-10-CM

## 2023-04-02 NOTE — Telephone Encounter (Signed)
 Please review and send

## 2023-04-02 NOTE — Telephone Encounter (Signed)
 C3-6 posterior cervical laminectomy and fusion on 03/02/23  Patient fell a few days ago. Her neck and left shoulder feels stiff. She says it's more painful than before. She is taking oxycodone  as directed. She is worried that she might have caused damage to her surgery. What do you recommend?

## 2023-04-02 NOTE — Progress Notes (Signed)
 Patient fell again, repeat xrays ordered

## 2023-04-02 NOTE — Telephone Encounter (Signed)
 Spoke to patient and informed her that we have placed an order for X rays and to go at her convenience to get the images today.

## 2023-04-05 ENCOUNTER — Telehealth: Payer: Self-pay | Admitting: Family Medicine

## 2023-04-05 ENCOUNTER — Telehealth: Payer: Self-pay

## 2023-04-05 NOTE — Telephone Encounter (Signed)
 I spoke to [place] from [facility] and she wanted to find out how long patient should be wearing the cervical soft collar. I spoke to Dr. Claudene and he sates she should only wear it for comfort. She does not have to wear it if she does not think she needs it. Damien voiced understanding and will let Summer Murphy know.

## 2023-04-05 NOTE — Telephone Encounter (Signed)
-----   Message from Mason B sent at 04/05/2023 11:38 AM EST ----- Regarding: x-rays Contact: [phone] Patient is calling to follow up on her x-rays that were completed Friday.

## 2023-04-05 NOTE — Telephone Encounter (Signed)
 I spoke to patient and informed her the X ray looks normal. Patient still complains of her neck hurting. She will call back if she has any more questions.

## 2023-04-05 NOTE — Telephone Encounter (Signed)
 Elmer Bales from [facility] [phone] called that pt BP orthostatic resting 90/60 and standing 80/52 and alyssa already decreased her med made her appt with [facility] tomorrow and alyssa aware

## 2023-04-06 ENCOUNTER — Encounter: Payer: Self-pay | Admitting: Internal Medicine

## 2023-04-06 ENCOUNTER — Ambulatory Visit (INDEPENDENT_AMBULATORY_CARE_PROVIDER_SITE_OTHER): Payer: 59 | Admitting: Internal Medicine

## 2023-04-06 ENCOUNTER — Encounter: Payer: Self-pay | Admitting: Oncology

## 2023-04-06 VITALS — BP 110/70 | HR 90 | Temp 98.3°F | Resp 16 | Ht 63.0 in | Wt 130.6 lb

## 2023-04-06 DIAGNOSIS — I952 Hypotension due to drugs: Secondary | ICD-10-CM

## 2023-04-06 DIAGNOSIS — Z79899 Other long term (current) drug therapy: Secondary | ICD-10-CM

## 2023-04-06 DIAGNOSIS — M5412 Radiculopathy, cervical region: Secondary | ICD-10-CM

## 2023-04-06 NOTE — Progress Notes (Signed)
 [facility] [address] [place], [place] [address]  Internal MEDICINE  Office Visit Note  Patient Name: Summer Murphy  [id number]  [id number]  Date of Service: 04/21/2023  Chief Complaint  Patient presents with   Acute Visit   Hypotension    Has seen AA previously for this. AA changed Cardizem  to 120mg .    HPI Pt is seen for acute visit. Pt continues to have low blood pressure, her Cardizem  was reduced already. She has not fully recovered after her neck surgery (03/02/23, C3-6 posterior cervical lami and fusion) She is working with Mardy Maxin ( NP) to  taper some of her medications down including Klonopin      Current Medication: No facility-administered encounter medications on file as of 04/06/2023.   Outpatient Encounter Medications as of 04/06/2023  Medication Sig   Accu-Chek Softclix Lancets lancets Use as instructed once a daily DX R730.01   acetaminophen  (TYLENOL ) 500 MG tablet Take 2 tablets (1,000 mg total) by mouth every 6 (six) hours as needed.   alendronate  (FOSAMAX ) 70 MG tablet Take 1 tablet (70 mg total) by mouth once a week. Take with a full glass of water  on an empty stomach.   Blood Glucose Monitoring Suppl (ACCU-CHEK GUIDE ME) w/Device KIT Use as directed once a day DX R730.01   busPIRone  (BUSPAR ) 10 MG tablet Take 1 tablet (10 mg total) by mouth 2 (two) times daily.   celecoxib  (CELEBREX ) 100 MG capsule Take 1 capsule (100 mg total) by mouth 2 (two) times daily.   clonazePAM  (KLONOPIN ) 1 MG tablet Take 0.5-1 tablets (0.5-1 mg total) by mouth 2 (two) times daily as needed for anxiety.   COMBIVENT  RESPIMAT 20-100 MCG/ACT AERS respimat INHALE 1 PUFF BY MOUTH EVERY 6 HOURS   diclofenac  Sodium (VOLTAREN ) 1 % GEL Apply 4 g topically 4 (four) times daily. (Patient taking differently: Apply 4 g topically 4 (four) times daily as needed (pain).)   diltiazem  (CARDIZEM  CD) 120 MG 24 hr capsule Take 1 capsule (120 mg total) by mouth daily.    DULoxetine  (CYMBALTA ) 60 MG capsule TAKE 1 CAPSULE(60 MG) BY MOUTH AT BEDTIME   fluticasone -salmeterol (WIXELA INHUB) 250-50 MCG/ACT AEPB Inhale 1 puff into the lungs in the morning and at bedtime.   gabapentin  (NEURONTIN ) 300 MG capsule TAKE 1 CAPSULE(300 MG) BY MOUTH TWICE DAILY   gabapentin  (NEURONTIN ) 600 MG tablet TAKE 1 TABLET(600 MG) BY MOUTH TWICE DAILY   glucose blood (ACCU-CHEK GUIDE) test strip Use as instructed once daily Dx R73.01   ipratropium-albuterol  (DUONEB) 0.5-2.5 (3) MG/3ML SOLN Take 3 mLs by nebulization 4 (four) times daily.   lidocaine  (XYLOCAINE ) 2 % solution Use as directed 10 mLs in the mouth or throat every 6 (six) hours as needed for mouth pain.   montelukast  (SINGULAIR ) 10 MG tablet TAKE 1 TABLET(10 MG) BY MOUTH AT BEDTIME   ondansetron  (ZOFRAN ) 4 MG tablet TAKE 1 TABLET BY MOUTH TWICE DAILY AS NEEDED   [EXPIRED] oxyCODONE  (OXY IR/ROXICODONE ) 5 MG immediate release tablet Take 1 tablet (5 mg total) by mouth every 6 (six) hours as needed for up to 14 days for severe pain (pain score 7-10).   pantoprazole  (PROTONIX ) 40 MG tablet Take 40 mg by mouth 2 (two) times daily.   rOPINIRole  (REQUIP ) 0.5 MG tablet TAKE 2 TABLETS BY MOUTH TWICE DAILY FOR RESTLESS LEGS   senna (SENOKOT) 8.6 MG TABS tablet Take 1 tablet (8.6 mg total) by mouth 2 (two) times daily as needed for mild constipation.  SUMAtriptan  (IMITREX ) 100 MG tablet Take 1 tablet (100 mg total) by mouth every 2 (two) hours as needed (for migraine headaches.). May repeat in 1 hours if headache persists or recurs.   tirzepatide  (MOUNJARO ) 2.5 MG/0.5ML Pen Inject 2.5 mg into the skin once a week.   [DISCONTINUED] tiZANidine  (ZANAFLEX ) 4 MG tablet Take 1 tablet (4 mg total) by mouth every 8 (eight) hours as needed for up to 14 days for muscle spasms.   [DISCONTINUED] traZODone  (DESYREL ) 150 MG tablet TAKE 1 TABLET(150 MG) BY MOUTH AT BEDTIME    Surgical History: Past Surgical History:  Procedure Laterality Date    ABDOMINAL HYSTERECTOMY     AMPUTATION TOE Left 07/31/2016   Procedure: AMPUTATION TOE/MPJ 2nd toe;  Surgeon: Neill Boas, DPM;  Location: [facility] ORS;  Service: Podiatry;  Laterality: Left;   APPENDECTOMY  1990   BACK SURGERY     low back   BREAST SURGERY     bilateral breast reduction   CARDIAC ELECTROPHYSIOLOGY STUDY AND ABLATION     CERVICAL WOUND DEBRIDEMENT N/A 04/17/2023   Procedure: CERVICAL WOUND DEBRIDEMENT;  Surgeon: Bluford Standing, MD;  Location: [facility];  Service: Neurosurgery;  Laterality: N/A;   CHOLECYSTECTOMY  1990   COLONOSCOPY WITH PROPOFOL  N/A 01/04/2017   Procedure: COLONOSCOPY WITH PROPOFOL ;  Surgeon: Viktoria Lamar DASEN, MD;  Location: [facility] ENDOSCOPY;  Service: Endoscopy;  Laterality: N/A;   CORNEAL TRANSPLANT     ESOPHAGOGASTRODUODENOSCOPY N/A 04/09/2021   Procedure: ESOPHAGOGASTRODUODENOSCOPY (EGD);  Surgeon: Unk Corinn Skiff, MD;  Location: [facility] ENDOSCOPY;  Service: Gastroenterology;  Laterality: N/A;   ESOPHAGOGASTRODUODENOSCOPY (EGD) WITH PROPOFOL  N/A 01/04/2017   Procedure: ESOPHAGOGASTRODUODENOSCOPY (EGD) WITH PROPOFOL ;  Surgeon: Viktoria Lamar DASEN, MD;  Location: [facility] ENDOSCOPY;  Service: Endoscopy;  Laterality: N/A;   EXCISION BONE CYST Left 07/31/2016   Procedure: EXCISION BONE CYST/exostectomy 28124/left 2nd;  Surgeon: Neill Boas, DPM;  Location: [facility];  Service: Podiatry;  Laterality: Left;   EXTRACORPOREAL SHOCK WAVE LITHOTRIPSY Left 09/12/2015   Procedure: EXTRACORPOREAL SHOCK WAVE LITHOTRIPSY (ESWL);  Surgeon: Rosina Riis, MD;  Location: [facility];  Service: Urology;  Laterality: Left;   FRACTURE SURGERY     left foot   GUM SURGERY Left    gum infection   HARDWARE REMOVAL Left 11/14/2020   Procedure: HARDWARE REMOVAL;  Surgeon: Kathlynn Sharper, MD;  Location: [facility];  Service: Orthopedics;  Laterality: Left;   HH repair     Fundoplication   JOINT REPLACEMENT Bilateral 2013,2014   total knees   LAPAROSCOPIC HYSTERECTOMY     LITHOTRIPSY      periprosthetic supracondylar fracture of left femur  02/16/2020   [facility]   POSTERIOR CERVICAL FUSION/FORAMINOTOMY N/A 03/02/2023   Procedure: C3-6 POSTERIOR CERVICAL LAMINECTOMY AND FUSION;  Surgeon: Claudene Riis ORN, MD;  Location: [facility] ORS;  Service: Neurosurgery;  Laterality: N/A;   TONSILLECTOMY     TOTAL KNEE REVISION Right 11/14/2019   Procedure: Revision patella and tibial polyethylene;  Surgeon: Kathlynn Sharper, MD;  Location: [facility] OR[facility];  Service: Orthopedics;  Laterality: Right;   URETEROSCOPY      Medical History: Past Medical History:  Diagnosis Date   Acid reflux    Anemia    Anxiety    a.) buspirone  BID + BZO PRN (clonazepam )   Arthritis    Asthma    Cervical disc disorder with radiculopathy of cervical region    Chronic pain syndrome    a.) on COT as prescribed by pain mangement   Chronic, continuous use of opioids  a.) followed by pain mangement   COPD (chronic obstructive pulmonary disease) (HCC)    DOE (dyspnea on exertion)    Hematuria    Hiatal hernia    History of kidney stones    Hypertension    Hypoglycemia    Insomnia    a.) uses Trazodone  PRN   LBBB (left bundle branch block)    Migraine    Murmur    Osteopenia    Palpitations    Pre-diabetes    Restless leg    a.) on ropinirole    Stenosis of cervical spine with myelopathy (HCC)    T2DM (type 2 diabetes mellitus) (HCC)     Family History: Family History  Problem Relation Age of Onset   Hypertension Mother    Stroke Mother    Prostate cancer Father    Kidney Stones Father    Diabetes Brother    Hypertension Brother    Breast cancer Maternal Aunt    Kidney disease Neg Hx     Social History   Socioeconomic History   Marital status: Widowed    Spouse name: Not on file   Number of children: Not on file   Years of education: Not on file   Highest education level: Not on file  Occupational History   Not on file  Tobacco Use   Smoking status: Never    Passive exposure: Past    Smokeless tobacco: Never  Vaping Use   Vaping status: Never Used  Substance and Sexual Activity   Alcohol use: No   Drug use: No   Sexual activity: Not Currently  Other Topics Concern   Not on file  Social History Narrative   Lives at home alone. Relatives are the support person.    Social Drivers of Corporate Investment Banker Strain: Not on file  Food Insecurity: No Food Insecurity (04/18/2023)   Hunger Vital Sign    Worried About Running Out of Food in the Last Year: Never true    Ran Out of Food in the Last Year: Never true  Transportation Needs: No Transportation Needs (04/18/2023)   PRAPARE - Administrator, Civil Service (Medical): No    Lack of Transportation (Non-Medical): No  Physical Activity: Not on file  Stress: Not on file  Social Connections: Moderately Isolated (04/16/2023)   Social Connection and Isolation Panel [NHANES]    Frequency of Communication with Friends and Family: More than three times a week    Frequency of Social Gatherings with Friends and Family: Once a week    Attends Religious Services: More than 4 times per year    Active Member of [employer] or Organizations: No    Attends Banker Meetings: Never    Marital Status: Widowed  Intimate Partner Violence: Not At Risk (04/16/2023)   Humiliation, Afraid, Rape, and Kick questionnaire    Fear of Current or Ex-Partner: No    Emotionally Abused: No    Physically Abused: No    Sexually Abused: No      Review of Systems  Constitutional:  Negative for chills, fatigue and unexpected weight change.  HENT:  Negative for congestion, postnasal drip, rhinorrhea, sneezing and sore throat.   Eyes:  Negative for redness.  Respiratory:  Negative for cough, chest tightness and shortness of breath.   Cardiovascular:  Negative for chest pain and palpitations.       Tachycardia   Gastrointestinal:  Negative for abdominal pain, constipation, diarrhea, nausea and vomiting.  Genitourinary:  Negative for dysuria and frequency.  Musculoskeletal:  Negative for arthralgias, back pain, joint swelling and neck pain.  Skin:  Negative for rash.  Neurological:  Positive for dizziness. Negative for tremors and numbness.  Hematological:  Negative for adenopathy. Does not bruise/bleed easily.  Psychiatric/Behavioral:  Negative for behavioral problems (Depression), sleep disturbance and suicidal ideas. The patient is not nervous/anxious.     Vital Signs: BP 110/70   Pulse 90   Temp 98.3 F (36.8 C)   Resp 16   Ht 5' 3 (1.6 m)   Wt 130 lb 9.6 oz (59.2 kg)   SpO2 96%   BMI 23.13 kg/m    Physical Exam Constitutional:      Appearance: Normal appearance.  HENT:     Head: Normocephalic and atraumatic.     Nose: Nose normal.     Mouth/Throat:     Mouth: Mucous membranes are moist.     Pharynx: No posterior oropharyngeal erythema.  Eyes:     Extraocular Movements: Extraocular movements intact.     Pupils: Pupils are equal, round, and reactive to light.  Cardiovascular:     Rate and Rhythm: Tachycardia present.     Pulses: Normal pulses.     Heart sounds: Normal heart sounds.  Pulmonary:     Effort: Pulmonary effort is normal.     Breath sounds: Normal breath sounds.  Neurological:     General: No focal deficit present.     Mental Status: She is alert.  Psychiatric:        Mood and Affect: Mood normal.        Behavior: Behavior normal.        Assessment/Plan: 1. Hypotension due to drugs (Primary) Will DC Cardizem  for now, monitor bp at home, check cbc to look for anemia or any sign of infection - CMP14+EGFR - CBC with Differential/Platelet  2. Cervical radiculopathy Followed by surgery   3. Polypharmacy Pt is on multiple medications which can cause CNS depression, will work to taper down    General Counseling: Terena verbalizes understanding of the findings of todays visit and agrees with plan of treatment. I have discussed any further diagnostic evaluation  that may be needed or ordered today. We also reviewed her medications today. she has been encouraged to call the office with any questions or concerns that should arise related to todays visit.    Orders Placed This Encounter  Procedures   CMP14+EGFR   CBC with Differential/Platelet    No orders of the defined types were placed in this encounter.   Total time spent:35 Minutes Time spent includes review of chart, medications, test results, and follow up plan with the patient.   [place] Controlled Substance Database was reviewed by me.   Dr Stephane Niemann M [facility] Internal [facility]

## 2023-04-08 NOTE — Telephone Encounter (Signed)
I called Summer Murphy to discuss my progress in getting a home health evaluation for her. She still complains of pain. I asked her which medications she has taken recently she states, she has taken tylenol, oxycodone (a few hours ago) as well as her muscle relaxer. I advised her to take her oxycodone 6 hours after her last dose. I told her that if she cannot manage her pain, then the ER is an option. I told her that I would follow up tomorrow morning to see how she is doing.

## 2023-04-08 NOTE — Telephone Encounter (Signed)
I called patient to follow up on her pain. Patient answered the phone crying and distressed. I asked her about her pain which she states her neck is 10/10. I inquired about her medications. She said that she is taking her medications as prescribed. Upon inquiry, patient was unable to find her Celebrex medication. Upon discussing further medications, she was unable to identify the frequency in which she should be taking her Zanaflex. This patient has had a frequent history of not taking her medications as prescribed. She seems to be confused and unable to manage her medications alone. I am concerned that she is not taking medications as directed causing her to have uncontrolled pain. I discussed with Royston Cowper RN, she suggested that we contact [facility] who is already seeing her for PT to inquire about home health for the patient to assist with medication management. I told the patient that I would work on this for her.

## 2023-04-08 NOTE — Telephone Encounter (Signed)
I called Irving Burton PT with [facility], I made her aware of our concerns of the patient's lack of ability to manage her medications. She said that she would put in a order for nursing within her agency to evaluate her for home health. I will call Ms.Ausley and aware that they will be in contact.

## 2023-04-08 NOTE — Telephone Encounter (Signed)
Patient calling the office that she is not sure why her xrays look fine but she is still in a lot of pain at the back of her neck down her left arm. She is taking all her medications as directed oxycodone, tizanidine, and tylenol. What should she do? She is crying and upset on the phone. She is aware that Dr.Smith is in surgery today.

## 2023-04-08 NOTE — Telephone Encounter (Signed)
I spoke with Dr. Katrinka Blazing regarding Summer Murphy and I's concerns for poor pain management due to medication adherence issues. Patient is experiencing polypharmacy with misuse/lack of use of prescribed medications. He was in agreement that given her repeated communications with [place] where we have assessed that she is not taking her medications appropriately. I have included her PCP in this conversation as we wanted her input in contacting home health for [name].

## 2023-04-09 ENCOUNTER — Telehealth: Payer: Self-pay | Admitting: Neurosurgery

## 2023-04-09 ENCOUNTER — Telehealth: Payer: Self-pay

## 2023-04-09 NOTE — Telephone Encounter (Signed)
I called Summer Murphy to follow up on her pain. I wanted to make sure she was feeling better and see if she had heard anything from [place] about a nurse coming out to evaluate her for home health assistance. She has not heard anything but will let us know when she does.   She endorses that she is in pain but seems in better spirits than yesterday. She said that she is using a cold compress and has taken her morning pain medication. She is aware that she can contact us with any further concerns.

## 2023-04-09 NOTE — Telephone Encounter (Signed)
I spoke to patient and she states she is taking all of the medication that we have prescribed as directed. I informed her that if she feels she cannot get her pain under control on the medications she should go to the ER to see if they can control the pain. She refused to go to ER. I have scheduled her an appointment on Monday with Dr. Katrinka Blazing and asked her to bring all of her medications to the appointment so we can be certain she is taking them properly.

## 2023-04-09 NOTE — Telephone Encounter (Signed)
Patient is calling to let our office know that she is still experiencing a lot of pain. She would like to know if she is not getting relief with the medication with having been on it for so long. She states the medication is not giving her any relief. Please advise.

## 2023-04-09 NOTE — Telephone Encounter (Signed)
On her medication list she has: Tylenol Celebrex Duloxetine Gabapentin Oxycodone Tizanidine   Need to find out if she is taking all of the above as prescribed. She may need to go to ER or come in on Monday to see Nehemiah Settle or Dr Katrinka Blazing.

## 2023-04-12 ENCOUNTER — Ambulatory Visit: Payer: 59 | Admitting: Nurse Practitioner

## 2023-04-12 ENCOUNTER — Telehealth: Payer: Self-pay

## 2023-04-12 ENCOUNTER — Ambulatory Visit (INDEPENDENT_AMBULATORY_CARE_PROVIDER_SITE_OTHER): Payer: 59 | Admitting: Neurosurgery

## 2023-04-12 ENCOUNTER — Encounter: Payer: 59 | Admitting: Neurosurgery

## 2023-04-12 ENCOUNTER — Other Ambulatory Visit: Payer: Self-pay | Admitting: Neurosurgery

## 2023-04-12 VITALS — BP 124/70

## 2023-04-12 DIAGNOSIS — Z981 Arthrodesis status: Secondary | ICD-10-CM

## 2023-04-12 DIAGNOSIS — M4802 Spinal stenosis, cervical region: Secondary | ICD-10-CM

## 2023-04-12 DIAGNOSIS — G4486 Cervicogenic headache: Secondary | ICD-10-CM

## 2023-04-12 DIAGNOSIS — G992 Myelopathy in diseases classified elsewhere: Secondary | ICD-10-CM

## 2023-04-12 NOTE — Progress Notes (Signed)
Patient brought her medications to her appointment. We discussed medication schedules and I completed medication education for pain management after her surgery.   I provided her with a list of her medications, prescribed frequencies and worked with her to establish a time schedule for each medication of which times that she can take her medications. We color coded these medications by time she should take them in hopes of providing her with better pain management and independence.   She indicates understanding that her prescription of oxycodone will run out and that we are unable to provide further refills. We discussed the reason for this is due to increased risk of dependency and addiction concerns.   We discussed alternatives to pain management such as utilizing ice for 20 minute periods at a time, and no longer than this. I asked Dr.Smith about her Voltaren gel and her ability to apply this. He said that she can now put it over the incision as it is healed.    Patient indicates understanding of all medication uses, prescribed frequencies in which she can take them, as well as the risks related to taking too much tylenol, taking Celebrex with ADVIL.   Paperwork completed was scanned to be put in the chart for reference.

## 2023-04-12 NOTE — Progress Notes (Signed)
   REFERRING PHYSICIAN:  [name], [address] [place],  [place] [address]  DOS: 03/02/23, C3-6 posterior cervical lami and fusion   HISTORY OF PRESENT ILLNESS: Summer Murphy is almost 6 weeks out from surgery.  Summer Murphy is here today with Summer Murphy pastor.  Summer Murphy continues to have neck pain and pain in Summer Murphy left upper extremity.  Summer Murphy pastor notes that Summer Murphy often does fall asleep with Summer Murphy head slumped forward and when Summer Murphy is taking all of Summer Murphy medicines that Summer Murphy is quite sleepy.  Summer Murphy admits that Summer Murphy has a very difficult time organizing Summer Murphy medications.  We have requested home health nursing support for Summer Murphy because of this. PHYSICAL EXAMINATION:  NEUROLOGICAL:  General: In no acute distress.   Awake, alert, oriented to person, place, and time.  Pupils equal round and reactive to light.  Facial tone is symmetric.    Strength: Side Biceps Triceps Deltoid Interossei Grip Wrist Ext. Wrist Flex.  R 5 5 5  4+ 4+ 4+ 4+  L 5 5 5 4 4 4 4    Incision c/d/I. Healing well minimal surrounding erythema.  No drainage.    Assessment / Plan: Summer Murphy is one month status post C3-6 cervical lami and fusion.  Summer Murphy has had difficulty with pain management postoperatively and has had numerous falls.  Summer Murphy acknowledges difficulty with medication management, and we have made a request for home health management.  At this time Summer Murphy is only 6 weeks out from posterior cervical surgery, we let Summer Murphy know again that patients with posterior cervical surgery are often quite sore into 3 months from surgery.  And also that given Summer Murphy significant amount of myelomalacia or bruising on the spine that some of Summer Murphy symptoms may be permanent and that the surgery was to keep Summer Murphy from getting progressively worse.   We went through Summer Murphy home activities that Summer Murphy should continue and avoid.  Overall it was helpful to have Summer Murphy pastor here to help with recording some of these talks as Summer Murphy has had trouble with remembering them in the recent  past.  Overall Summer Murphy wound is healing well.  Summer Murphy neurologic exam remained stable.  Summer Murphy imaging after Summer Murphy falls has been stable as well.  No evidence of instrumentation breakdown.  Advised to contact the office if any questions or concerns arise.   Jana Half MD Dept of Neurosurgery

## 2023-04-12 NOTE — Telephone Encounter (Signed)
Per Dr. Katrinka Blazing, patient does not need a follow up appointment. She has a follow up on 05/24/2023, he feels that this is appropriate for where she is at post surgery.

## 2023-04-13 NOTE — Telephone Encounter (Signed)
I called the patient to make her aware of the appt. She is asking for Lauren. She is confused on how to use a pill box. The pharmacist filled her pill box for 1 day yesterday. He did it so quick that she forgot what he did. What should she so? Can you help her with this?

## 2023-04-13 NOTE — Telephone Encounter (Signed)
I spoke with Irving Burton with [facility] to inquire about the status of nursing going out to help her w/ her medications. I updated Irving Burton about the phone call we received from the patient this morning, as well as the visit she had yesterday. Irving Burton states they have it down that her insurance is terminated. I informed her that the [facility] website is showing that it is active. Irving Burton is going to notify the office so they can look into this to hopefully get someone out there ASAP, as they were originally supposed to go out there today, but put it on hold because of the insurance issue.

## 2023-04-13 NOTE — Telephone Encounter (Signed)
I called [name] to answer her follow up questions relating to her medication organization. Patient bought a new pill organizer as recommended. She wanted to know how to organize her medications. I instructed her that she can put her pills in the pill box corresponding to which time of day in which she should take these medications i.e. morning, afternoon, evening and night. I educated her that this should be done based on the schedule that we established for her yesterday. Patient indicates understanding. Patient states that she hurt her back while moving her lazyboy around the house. Patient was educated while in the office by Dr.Smith and has been told previously to avoid activities that include pushing, pulling, twisting,lifting and bending.

## 2023-04-14 ENCOUNTER — Telehealth: Payer: Self-pay

## 2023-04-14 NOTE — Telephone Encounter (Signed)
Nimisha gave AA patient's [facility] form to fill out.

## 2023-04-15 ENCOUNTER — Other Ambulatory Visit: Payer: Self-pay | Admitting: Nurse Practitioner

## 2023-04-15 ENCOUNTER — Telehealth: Payer: Self-pay

## 2023-04-15 ENCOUNTER — Telehealth: Payer: Self-pay | Admitting: Nurse Practitioner

## 2023-04-15 DIAGNOSIS — Z79899 Other long term (current) drug therapy: Secondary | ICD-10-CM

## 2023-04-15 NOTE — Telephone Encounter (Signed)
[facility]

## 2023-04-15 NOTE — Telephone Encounter (Signed)
Sent med to [employer]

## 2023-04-16 ENCOUNTER — Other Ambulatory Visit: Payer: Self-pay

## 2023-04-16 ENCOUNTER — Emergency Department: Payer: 59

## 2023-04-16 ENCOUNTER — Telehealth: Payer: Self-pay | Admitting: Neurosurgery

## 2023-04-16 ENCOUNTER — Encounter: Payer: Self-pay | Admitting: Oncology

## 2023-04-16 ENCOUNTER — Encounter: Payer: Self-pay | Admitting: Emergency Medicine

## 2023-04-16 ENCOUNTER — Inpatient Hospital Stay
Admission: EM | Admit: 2023-04-16 | Discharge: 2023-04-28 | DRG: 862 | Disposition: A | Discharge status: Skilled Nursing Facility | Payer: 59 | Attending: Student in an Organized Health Care Education/Training Program | Admitting: Student in an Organized Health Care Education/Training Program

## 2023-04-16 DIAGNOSIS — T8149XA Infection following a procedure, other surgical site, initial encounter: Secondary | ICD-10-CM

## 2023-04-16 DIAGNOSIS — G43909 Migraine, unspecified, not intractable, without status migrainosus: Secondary | ICD-10-CM | POA: Diagnosis present

## 2023-04-16 DIAGNOSIS — D509 Iron deficiency anemia, unspecified: Secondary | ICD-10-CM | POA: Diagnosis present

## 2023-04-16 DIAGNOSIS — W19XXXA Unspecified fall, initial encounter: Principal | ICD-10-CM

## 2023-04-16 DIAGNOSIS — A4101 Sepsis due to Methicillin susceptible Staphylococcus aureus: Secondary | ICD-10-CM | POA: Diagnosis present

## 2023-04-16 DIAGNOSIS — K219 Gastro-esophageal reflux disease without esophagitis: Secondary | ICD-10-CM | POA: Diagnosis present

## 2023-04-16 DIAGNOSIS — Z79899 Other long term (current) drug therapy: Secondary | ICD-10-CM

## 2023-04-16 DIAGNOSIS — E876 Hypokalemia: Secondary | ICD-10-CM | POA: Diagnosis present

## 2023-04-16 DIAGNOSIS — Z823 Family history of stroke: Secondary | ICD-10-CM

## 2023-04-16 DIAGNOSIS — Z833 Family history of diabetes mellitus: Secondary | ICD-10-CM

## 2023-04-16 DIAGNOSIS — F418 Other specified anxiety disorders: Secondary | ICD-10-CM

## 2023-04-16 DIAGNOSIS — Z8042 Family history of malignant neoplasm of prostate: Secondary | ICD-10-CM

## 2023-04-16 DIAGNOSIS — Z7985 Long-term (current) use of injectable non-insulin antidiabetic drugs: Secondary | ICD-10-CM

## 2023-04-16 DIAGNOSIS — Z886 Allergy status to analgesic agent status: Secondary | ICD-10-CM

## 2023-04-16 DIAGNOSIS — W010XXA Fall on same level from slipping, tripping and stumbling without subsequent striking against object, initial encounter: Secondary | ICD-10-CM | POA: Diagnosis present

## 2023-04-16 DIAGNOSIS — Z1152 Encounter for screening for COVID-19: Secondary | ICD-10-CM

## 2023-04-16 DIAGNOSIS — M1611 Unilateral primary osteoarthritis, right hip: Secondary | ICD-10-CM | POA: Diagnosis present

## 2023-04-16 DIAGNOSIS — I82220 Acute embolism and thrombosis of inferior vena cava: Secondary | ICD-10-CM | POA: Diagnosis present

## 2023-04-16 DIAGNOSIS — D638 Anemia in other chronic diseases classified elsewhere: Secondary | ICD-10-CM | POA: Diagnosis present

## 2023-04-16 DIAGNOSIS — J45909 Unspecified asthma, uncomplicated: Secondary | ICD-10-CM | POA: Diagnosis present

## 2023-04-16 DIAGNOSIS — Z90710 Acquired absence of both cervix and uterus: Secondary | ICD-10-CM

## 2023-04-16 DIAGNOSIS — Z881 Allergy status to other antibiotic agents status: Secondary | ICD-10-CM

## 2023-04-16 DIAGNOSIS — E119 Type 2 diabetes mellitus without complications: Secondary | ICD-10-CM

## 2023-04-16 DIAGNOSIS — Z9189 Other specified personal risk factors, not elsewhere classified: Secondary | ICD-10-CM

## 2023-04-16 DIAGNOSIS — B9561 Methicillin susceptible Staphylococcus aureus infection as the cause of diseases classified elsewhere: Secondary | ICD-10-CM

## 2023-04-16 DIAGNOSIS — R296 Repeated falls: Secondary | ICD-10-CM

## 2023-04-16 DIAGNOSIS — T8142XA Infection following a procedure, deep incisional surgical site, initial encounter: Secondary | ICD-10-CM | POA: Diagnosis not present

## 2023-04-16 DIAGNOSIS — A419 Sepsis, unspecified organism: Secondary | ICD-10-CM

## 2023-04-16 DIAGNOSIS — K6812 Psoas muscle abscess: Secondary | ICD-10-CM | POA: Diagnosis present

## 2023-04-16 DIAGNOSIS — G992 Myelopathy in diseases classified elsewhere: Secondary | ICD-10-CM | POA: Diagnosis present

## 2023-04-16 DIAGNOSIS — Z947 Corneal transplant status: Secondary | ICD-10-CM

## 2023-04-16 DIAGNOSIS — Z87442 Personal history of urinary calculi: Secondary | ICD-10-CM

## 2023-04-16 DIAGNOSIS — F419 Anxiety disorder, unspecified: Secondary | ICD-10-CM | POA: Diagnosis present

## 2023-04-16 DIAGNOSIS — R509 Fever, unspecified: Secondary | ICD-10-CM | POA: Diagnosis not present

## 2023-04-16 DIAGNOSIS — Z9049 Acquired absence of other specified parts of digestive tract: Secondary | ICD-10-CM

## 2023-04-16 DIAGNOSIS — Z89422 Acquired absence of other left toe(s): Secondary | ICD-10-CM

## 2023-04-16 DIAGNOSIS — M4620 Osteomyelitis of vertebra, site unspecified: Secondary | ICD-10-CM

## 2023-04-16 DIAGNOSIS — Z7901 Long term (current) use of anticoagulants: Secondary | ICD-10-CM

## 2023-04-16 DIAGNOSIS — I10 Essential (primary) hypertension: Secondary | ICD-10-CM | POA: Diagnosis present

## 2023-04-16 DIAGNOSIS — G253 Myoclonus: Secondary | ICD-10-CM | POA: Diagnosis present

## 2023-04-16 DIAGNOSIS — G894 Chronic pain syndrome: Secondary | ICD-10-CM

## 2023-04-16 DIAGNOSIS — E871 Hypo-osmolality and hyponatremia: Secondary | ICD-10-CM | POA: Diagnosis present

## 2023-04-16 DIAGNOSIS — E878 Other disorders of electrolyte and fluid balance, not elsewhere classified: Secondary | ICD-10-CM

## 2023-04-16 DIAGNOSIS — F32A Depression, unspecified: Secondary | ICD-10-CM | POA: Diagnosis present

## 2023-04-16 DIAGNOSIS — Z91148 Patient's other noncompliance with medication regimen for other reason: Secondary | ICD-10-CM

## 2023-04-16 DIAGNOSIS — Y838 Other surgical procedures as the cause of abnormal reaction of the patient, or of later complication, without mention of misadventure at the time of the procedure: Secondary | ICD-10-CM | POA: Diagnosis present

## 2023-04-16 DIAGNOSIS — M4626 Osteomyelitis of vertebra, lumbar region: Secondary | ICD-10-CM | POA: Diagnosis present

## 2023-04-16 DIAGNOSIS — J479 Bronchiectasis, uncomplicated: Secondary | ICD-10-CM | POA: Diagnosis present

## 2023-04-16 DIAGNOSIS — Z751 Person awaiting admission to adequate facility elsewhere: Secondary | ICD-10-CM

## 2023-04-16 DIAGNOSIS — Z803 Family history of malignant neoplasm of breast: Secondary | ICD-10-CM

## 2023-04-16 DIAGNOSIS — Z7409 Other reduced mobility: Secondary | ICD-10-CM

## 2023-04-16 DIAGNOSIS — Z888 Allergy status to other drugs, medicaments and biological substances status: Secondary | ICD-10-CM

## 2023-04-16 DIAGNOSIS — Y92009 Unspecified place in unspecified non-institutional (private) residence as the place of occurrence of the external cause: Secondary | ICD-10-CM

## 2023-04-16 DIAGNOSIS — J4489 Other specified chronic obstructive pulmonary disease: Secondary | ICD-10-CM | POA: Diagnosis present

## 2023-04-16 DIAGNOSIS — Z8249 Family history of ischemic heart disease and other diseases of the circulatory system: Secondary | ICD-10-CM

## 2023-04-16 DIAGNOSIS — Z882 Allergy status to sulfonamides status: Secondary | ICD-10-CM

## 2023-04-16 DIAGNOSIS — I447 Left bundle-branch block, unspecified: Secondary | ICD-10-CM | POA: Diagnosis present

## 2023-04-16 DIAGNOSIS — Z9102 Food additives allergy status: Secondary | ICD-10-CM

## 2023-04-16 DIAGNOSIS — M4622 Osteomyelitis of vertebra, cervical region: Secondary | ICD-10-CM

## 2023-04-16 DIAGNOSIS — M4649 Discitis, unspecified, multiple sites in spine: Secondary | ICD-10-CM | POA: Diagnosis present

## 2023-04-16 DIAGNOSIS — E1169 Type 2 diabetes mellitus with other specified complication: Secondary | ICD-10-CM | POA: Diagnosis present

## 2023-04-16 DIAGNOSIS — G2581 Restless legs syndrome: Secondary | ICD-10-CM | POA: Diagnosis present

## 2023-04-16 DIAGNOSIS — Z7983 Long term (current) use of bisphosphonates: Secondary | ICD-10-CM

## 2023-04-16 DIAGNOSIS — M4646 Discitis, unspecified, lumbar region: Secondary | ICD-10-CM

## 2023-04-16 DIAGNOSIS — Z885 Allergy status to narcotic agent status: Secondary | ICD-10-CM

## 2023-04-16 DIAGNOSIS — Z981 Arthrodesis status: Secondary | ICD-10-CM

## 2023-04-16 LAB — CBC WITH DIFFERENTIAL/PLATELET
Abs Immature Granulocytes: 0.15 10*3/uL — ABNORMAL HIGH (ref 0.00–0.07)
Basophils Absolute: 0 10*3/uL (ref 0.0–0.1)
Basophils Relative: 0 %
Eosinophils Absolute: 0 10*3/uL (ref 0.0–0.5)
Eosinophils Relative: 0 %
HCT: 34.4 % — ABNORMAL LOW (ref 36.0–46.0)
Hemoglobin: 10.7 g/dL — ABNORMAL LOW (ref 12.0–15.0)
Immature Granulocytes: 2 %
Lymphocytes Relative: 4 %
Lymphs Abs: 0.3 10*3/uL — ABNORMAL LOW (ref 0.7–4.0)
MCH: 21.7 pg — ABNORMAL LOW (ref 26.0–34.0)
MCHC: 31.1 g/dL (ref 30.0–36.0)
MCV: 69.6 fL — ABNORMAL LOW (ref 80.0–100.0)
Monocytes Absolute: 0.6 10*3/uL (ref 0.1–1.0)
Monocytes Relative: 7 %
Neutro Abs: 7.9 10*3/uL — ABNORMAL HIGH (ref 1.7–7.7)
Neutrophils Relative %: 87 %
Platelets: 332 10*3/uL (ref 150–400)
RBC: 4.94 MIL/uL (ref 3.87–5.11)
RDW: 23.6 % — ABNORMAL HIGH (ref 11.5–15.5)
Smear Review: NORMAL
WBC: 9.1 10*3/uL (ref 4.0–10.5)
nRBC: 0 % (ref 0.0–0.2)

## 2023-04-16 LAB — RESP PANEL BY RT-PCR (RSV, FLU A&B, COVID)  RVPGX2
Influenza A by PCR: NEGATIVE
Influenza B by PCR: NEGATIVE
Resp Syncytial Virus by PCR: NEGATIVE
SARS Coronavirus 2 by RT PCR: NEGATIVE

## 2023-04-16 LAB — COMPREHENSIVE METABOLIC PANEL
ALT: 14 U/L (ref 0–44)
AST: 16 U/L (ref 15–41)
Albumin: 2.5 g/dL — ABNORMAL LOW (ref 3.5–5.0)
Alkaline Phosphatase: 92 U/L (ref 38–126)
Anion gap: 16 — ABNORMAL HIGH (ref 5–15)
BUN: 30 mg/dL — ABNORMAL HIGH (ref 8–23)
CO2: 23 mmol/L (ref 22–32)
Calcium: 8.4 mg/dL — ABNORMAL LOW (ref 8.9–10.3)
Chloride: 93 mmol/L — ABNORMAL LOW (ref 98–111)
Creatinine, Ser: 0.69 mg/dL (ref 0.44–1.00)
GFR, Estimated: >60 mL/min (ref >60)
Glucose, Bld: 113 mg/dL — ABNORMAL HIGH (ref 70–99)
Potassium: 3.5 mmol/L (ref 3.5–5.1)
Sodium: 132 mmol/L — ABNORMAL LOW (ref 135–145)
Total Bilirubin: 0.6 mg/dL (ref 0.0–1.2)
Total Protein: 6.4 g/dL — ABNORMAL LOW (ref 6.5–8.1)

## 2023-04-16 LAB — GLUCOSE, CAPILLARY: Glucose-Capillary: 108 mg/dL — ABNORMAL HIGH (ref 70–99)

## 2023-04-16 LAB — URINALYSIS, ROUTINE W REFLEX MICROSCOPIC
Bilirubin Urine: NEGATIVE
Glucose, UA: NEGATIVE mg/dL
Ketones, ur: 5 mg/dL — AB
Leukocytes,Ua: NEGATIVE
Nitrite: NEGATIVE
Protein, ur: NEGATIVE mg/dL
Specific Gravity, Urine: 1.009 (ref 1.005–1.030)
pH: 5 (ref 5.0–8.0)

## 2023-04-16 LAB — HEMOGLOBIN A1C
Hgb A1c MFr Bld: 5.8 % — ABNORMAL HIGH (ref 4.8–5.6)
Mean Plasma Glucose: 119.76 mg/dL

## 2023-04-16 LAB — TROPONIN I (HIGH SENSITIVITY)
Troponin I (High Sensitivity): 22 ng/L — ABNORMAL HIGH (ref <18)
Troponin I (High Sensitivity): 10 ng/L (ref <18)

## 2023-04-16 LAB — MAGNESIUM: Magnesium: 1.5 mg/dL — ABNORMAL LOW (ref 1.7–2.4)

## 2023-04-16 LAB — PROCALCITONIN: Procalcitonin: 0.4 ng/mL

## 2023-04-16 LAB — LACTIC ACID, PLASMA
Lactic Acid, Venous: 1 mmol/L (ref 0.5–1.9)
Lactic Acid, Venous: 0.8 mmol/L (ref 0.5–1.9)

## 2023-04-16 LAB — CK: Total CK: 29 U/L — ABNORMAL LOW (ref 38–234)

## 2023-04-16 LAB — CBG MONITORING, ED: Glucose-Capillary: 136 mg/dL — ABNORMAL HIGH (ref 70–99)

## 2023-04-16 MED ORDER — MONTELUKAST SODIUM 10 MG PO TABS
10.0000 mg | ORAL_TABLET | Freq: Every day | ORAL | Status: DC
  Administered 2023-04-16 – 2023-04-27 (×12): 10 mg via ORAL
  Filled 2023-04-16 (×12): qty 1

## 2023-04-16 MED ORDER — MAGNESIUM SULFATE 4 GM/100ML IV SOLN
4.0000 g | Freq: Once | INTRAVENOUS | Status: AC
  Administered 2023-04-16: 4 g via INTRAVENOUS
  Filled 2023-04-16: qty 100

## 2023-04-16 MED ORDER — VANCOMYCIN HCL IN DEXTROSE 1-5 GM/200ML-% IV SOLN
1000.0000 mg | Freq: Once | INTRAVENOUS | Status: AC
  Administered 2023-04-16: 1000 mg via INTRAVENOUS
  Filled 2023-04-16: qty 200

## 2023-04-16 MED ORDER — INSULIN ASPART 100 UNIT/ML IJ SOLN: 0.0000 [IU] | Freq: Every day | INTRAMUSCULAR | Status: DC

## 2023-04-16 MED ORDER — ROPINIROLE HCL 1 MG PO TABS
1.0000 mg | ORAL_TABLET | Freq: Every day | ORAL | Status: DC
  Administered 2023-04-16 – 2023-04-27 (×12): 1 mg via ORAL
  Filled 2023-04-16 (×12): qty 1

## 2023-04-16 MED ORDER — ONDANSETRON HCL 4 MG/2ML IJ SOLN
4.0000 mg | Freq: Four times a day (QID) | INTRAMUSCULAR | Status: DC | PRN
  Administered 2023-04-22 – 2023-04-24 (×6): 4 mg via INTRAVENOUS
  Filled 2023-04-16 (×7): qty 2

## 2023-04-16 MED ORDER — ENOXAPARIN SODIUM 40 MG/0.4ML IJ SOSY
40.0000 mg | PREFILLED_SYRINGE | INTRAMUSCULAR | Status: DC
  Administered 2023-04-16 – 2023-04-23 (×8): 40 mg via SUBCUTANEOUS
  Filled 2023-04-16 (×8): qty 0.4

## 2023-04-16 MED ORDER — ACETAMINOPHEN 500 MG PO TABS
1000.0000 mg | ORAL_TABLET | Freq: Once | ORAL | Status: AC
  Administered 2023-04-16: 1000 mg via ORAL
  Filled 2023-04-16: qty 2

## 2023-04-16 MED ORDER — BUSPIRONE HCL 10 MG PO TABS
10.0000 mg | ORAL_TABLET | Freq: Two times a day (BID) | ORAL | Status: DC
  Administered 2023-04-16 – 2023-04-28 (×23): 10 mg via ORAL
  Filled 2023-04-16 (×25): qty 1

## 2023-04-16 MED ORDER — IPRATROPIUM-ALBUTEROL 0.5-2.5 (3) MG/3ML IN SOLN
3.0000 mL | Freq: Four times a day (QID) | RESPIRATORY_TRACT | Status: DC | PRN
  Administered 2023-04-18: 3 mL via RESPIRATORY_TRACT
  Filled 2023-04-16: qty 3

## 2023-04-16 MED ORDER — GABAPENTIN 300 MG PO CAPS
900.0000 mg | ORAL_CAPSULE | Freq: Two times a day (BID) | ORAL | Status: DC
  Administered 2023-04-16 – 2023-04-28 (×23): 900 mg via ORAL
  Filled 2023-04-16 (×24): qty 3

## 2023-04-16 MED ORDER — METRONIDAZOLE 500 MG/100ML IV SOLN
500.0000 mg | Freq: Once | INTRAVENOUS | Status: AC
  Administered 2023-04-16: 500 mg via INTRAVENOUS
  Filled 2023-04-16: qty 100

## 2023-04-16 MED ORDER — LORAZEPAM 2 MG/ML IJ SOLN
0.5000 mg | Freq: Once | INTRAMUSCULAR | Status: AC
  Administered 2023-04-16: 0.5 mg via INTRAVENOUS
  Filled 2023-04-16: qty 1

## 2023-04-16 MED ORDER — INSULIN ASPART 100 UNIT/ML IJ SOLN
0.0000 [IU] | Freq: Three times a day (TID) | INTRAMUSCULAR | Status: DC
Start: 2023-04-16 — End: 2023-04-22
  Administered 2023-04-16: 2 [IU] via SUBCUTANEOUS
  Administered 2023-04-17: 3 [IU] via SUBCUTANEOUS
  Filled 2023-04-16 (×2): qty 1

## 2023-04-16 MED ORDER — ACETAMINOPHEN 650 MG RE SUPP: 650.0000 mg | Freq: Four times a day (QID) | RECTAL | Status: DC | PRN

## 2023-04-16 MED ORDER — SODIUM CHLORIDE 0.9 % IV SOLN
INTRAVENOUS | Status: AC
Start: 2023-04-16 — End: 2023-04-17

## 2023-04-16 MED ORDER — TRAZODONE HCL 50 MG PO TABS
100.0000 mg | ORAL_TABLET | Freq: Every day | ORAL | Status: DC
  Administered 2023-04-16 – 2023-04-27 (×12): 100 mg via ORAL
  Filled 2023-04-16 (×12): qty 2

## 2023-04-16 MED ORDER — DULOXETINE HCL 30 MG PO CPEP
60.0000 mg | ORAL_CAPSULE | Freq: Every day | ORAL | Status: DC
  Administered 2023-04-18 – 2023-04-28 (×11): 60 mg via ORAL
  Filled 2023-04-16 (×13): qty 2

## 2023-04-16 MED ORDER — ACETAMINOPHEN 325 MG PO TABS
650.0000 mg | ORAL_TABLET | Freq: Four times a day (QID) | ORAL | Status: DC | PRN
  Administered 2023-04-17 – 2023-04-21 (×9): 650 mg via ORAL
  Filled 2023-04-16 (×10): qty 2

## 2023-04-16 MED ORDER — POLYETHYLENE GLYCOL 3350 17 G PO PACK
17.0000 g | PACK | Freq: Every day | ORAL | Status: DC | PRN
  Administered 2023-04-18: 17 g via ORAL
  Filled 2023-04-16 (×2): qty 1

## 2023-04-16 MED ORDER — SODIUM CHLORIDE 0.9 % IV SOLN
2.0000 g | Freq: Once | INTRAVENOUS | Status: AC
  Administered 2023-04-16: 2 g via INTRAVENOUS
  Filled 2023-04-16: qty 12.5

## 2023-04-16 MED ORDER — SODIUM CHLORIDE 0.9 % IV BOLUS
1000.0000 mL | Freq: Once | INTRAVENOUS | Status: AC
  Administered 2023-04-16: 1000 mL via INTRAVENOUS

## 2023-04-16 MED ORDER — ONDANSETRON HCL 4 MG PO TABS
4.0000 mg | ORAL_TABLET | Freq: Four times a day (QID) | ORAL | Status: DC | PRN
  Administered 2023-04-20 – 2023-04-27 (×5): 4 mg via ORAL
  Filled 2023-04-16 (×5): qty 1

## 2023-04-16 MED ORDER — FENTANYL CITRATE PF 50 MCG/ML IJ SOSY
6.2500 ug | PREFILLED_SYRINGE | Freq: Once | INTRAMUSCULAR | Status: AC
  Administered 2023-04-16: 6.5 ug via INTRAVENOUS
  Filled 2023-04-16: qty 1

## 2023-04-16 MED ORDER — SUMATRIPTAN SUCCINATE 50 MG PO TABS: 100.0000 mg | ORAL_TABLET | ORAL | Status: DC | PRN

## 2023-04-16 MED ORDER — CELECOXIB 100 MG PO CAPS
100.0000 mg | ORAL_CAPSULE | Freq: Two times a day (BID) | ORAL | Status: DC
Start: 2023-04-16 — End: 2023-04-28
  Administered 2023-04-16 – 2023-04-28 (×23): 100 mg via ORAL
  Filled 2023-04-16 (×24): qty 1

## 2023-04-16 MED ORDER — PANTOPRAZOLE SODIUM 20 MG PO TBEC
20.0000 mg | DELAYED_RELEASE_TABLET | Freq: Every day | ORAL | Status: DC
  Administered 2023-04-16 – 2023-04-28 (×12): 20 mg via ORAL
  Filled 2023-04-16 (×13): qty 1

## 2023-04-16 MED ORDER — CLONAZEPAM 0.5 MG PO TABS
0.5000 mg | ORAL_TABLET | Freq: Two times a day (BID) | ORAL | Status: DC | PRN
  Administered 2023-04-16 – 2023-04-19 (×2): 1 mg via ORAL
  Administered 2023-04-24 – 2023-04-25 (×2): 0.5 mg via ORAL
  Administered 2023-04-27: 1 mg via ORAL
  Filled 2023-04-16: qty 1
  Filled 2023-04-16 (×3): qty 2
  Filled 2023-04-16: qty 1

## 2023-04-16 MED ORDER — GADOBUTROL 1 MMOL/ML IV SOLN
6.0000 mL | Freq: Once | INTRAVENOUS | Status: AC | PRN
  Administered 2023-04-16: 6 mL via INTRAVENOUS

## 2023-04-16 MED ORDER — SODIUM CHLORIDE 0.9% FLUSH
3.0000 mL | Freq: Two times a day (BID) | INTRAVENOUS | Status: DC
  Administered 2023-04-16 – 2023-04-28 (×20): 3 mL via INTRAVENOUS

## 2023-04-16 MED ORDER — FLUTICASONE FUROATE-VILANTEROL 200-25 MCG/ACT IN AEPB
1.0000 | INHALATION_SPRAY | Freq: Every day | RESPIRATORY_TRACT | Status: DC
  Administered 2023-04-17 – 2023-04-28 (×10): 1 via RESPIRATORY_TRACT
  Filled 2023-04-16 (×2): qty 28

## 2023-04-16 NOTE — Assessment & Plan Note (Signed)
No upper respiratory symptoms or concern of exacerbation. -Continue home inhalers

## 2023-04-16 NOTE — Assessment & Plan Note (Signed)
-  Continue home BuSpar and Cymbalta

## 2023-04-16 NOTE — Progress Notes (Signed)
IV Team consulted for second line. Pt was assessed earlier, BUE were assessed with ultrasound. Current IV took 3 attempts, no other attemptable veins seen.

## 2023-04-16 NOTE — H&P (Signed)
History and Physical    Patient: Summer Murphy:[id number] DOB: [birth year] DOA: 04/16/2023 DOS: the patient was seen and examined on 04/16/2023 PCP: [name], NP  Patient coming from: Home  Chief Complaint:  Chief Complaint  Patient presents with   Fall   HPI: Summer Murphy is a 68 y.o. female with medical history significant of type 2 diabetes, asthma, arthritis s/p cervical fusion and anxiety came to ED after having a fall, she was trying to reach her walker which slipped and she fell and unable to get out of the floor easily.  Patient had another fall 2 days ago when she slipped from her chair.  She was complaining of pain in her knees and neck.  Patient with no recent illnesses, no upper respiratory symptoms, no headache, had 2 episodes of diarrhea 2 days ago which has been resolved.  No nausea or vomiting.  Appetite and p.o. intake at baseline.  No urinary symptoms No recent travel or concern of tick bite.  ED course and prior data reviewed.  On presentation she was febrile at 101.1 and tachycardic in 110, labs with no leukocytosis, hemoglobin 10.7 which is at her baseline, mild hyponatremia with sodium at 132, albumin 2.5, CK29, lactic acid 1, magnesium 1.5, procalcitonin 0.40. UA and urine cultures are pending Blood cultures were drawn.  Multiple imaging which include CT head, CT cervical spine, DG knee, DG hip and pelvis and CT scan of abdomen and pelvis was negative for any acute abnormality.  Patient received broad-spectrum antibiotics for concern of sepsis, sepsis has not ruled in yet as there is no obvious source of infection.  MRI of cervical spine was ordered by EDP as there was a small point tenderness at the base of prior surgical incision.  Giving some IV fluid, replacing magnesium and holding further antibiotics for now.  Review of Systems: As mentioned in the history of present illness. All other systems reviewed and are negative. Past Medical  History:  Diagnosis Date   Acid reflux    Anemia    Anxiety    a.) buspirone BID + BZO PRN (clonazepam)   Arthritis    Asthma    Cervical disc disorder with radiculopathy of cervical region    Chronic pain syndrome    a.) on COT as prescribed by pain mangement   Chronic, continuous use of opioids    a.) followed by pain mangement   COPD (chronic obstructive pulmonary disease) (HCC)    DOE (dyspnea on exertion)    Hematuria    Hiatal hernia    History of kidney stones    Hypertension    Hypoglycemia    Insomnia    a.) uses Trazodone PRN   LBBB (left bundle branch block)    Migraine    Murmur    Osteopenia    Palpitations    Pre-diabetes    Restless leg    a.) on ropinirole   Stenosis of cervical spine with myelopathy (HCC)    T2DM (type 2 diabetes mellitus) (HCC)    Past Surgical History:  Procedure Laterality Date   ABDOMINAL HYSTERECTOMY     AMPUTATION TOE Left 07/31/2016   Procedure: AMPUTATION TOE/MPJ 2nd toe;  Surgeon: Linus Galas, DPM;  Location: [facility] ORS;  Service: Podiatry;  Laterality: Left;   APPENDECTOMY  1990   BACK SURGERY     low back   BREAST SURGERY     bilateral breast reduction   CARDIAC ELECTROPHYSIOLOGY STUDY AND ABLATION  CHOLECYSTECTOMY  1990   COLONOSCOPY WITH PROPOFOL N/A 01/04/2017   Procedure: COLONOSCOPY WITH PROPOFOL;  Surgeon: Scot Jun, MD;  Location: [facility] ENDOSCOPY;  Service: Endoscopy;  Laterality: N/A;   CORNEAL TRANSPLANT     ESOPHAGOGASTRODUODENOSCOPY N/A 04/09/2021   Procedure: ESOPHAGOGASTRODUODENOSCOPY (EGD);  Surgeon: Toney Reil, MD;  Location: [facility] ENDOSCOPY;  Service: Gastroenterology;  Laterality: N/A;   ESOPHAGOGASTRODUODENOSCOPY (EGD) WITH PROPOFOL N/A 01/04/2017   Procedure: ESOPHAGOGASTRODUODENOSCOPY (EGD) WITH PROPOFOL;  Surgeon: Scot Jun, MD;  Location: [facility] ENDOSCOPY;  Service: Endoscopy;  Laterality: N/A;   EXCISION BONE CYST Left 07/31/2016   Procedure: EXCISION BONE CYST/exostectomy  28124/left 2nd;  Surgeon: Linus Galas, DPM;  Location: [facility] ORS;  Service: Podiatry;  Laterality: Left;   EXTRACORPOREAL SHOCK WAVE LITHOTRIPSY Left 09/12/2015   Procedure: EXTRACORPOREAL SHOCK WAVE LITHOTRIPSY (ESWL);  Surgeon: Vanna Scotland, MD;  Location: [facility] ORS;  Service: Urology;  Laterality: Left;   FRACTURE SURGERY     left foot   GUM SURGERY Left    gum infection   HARDWARE REMOVAL Left 11/14/2020   Procedure: HARDWARE REMOVAL;  Surgeon: Kennedy Bucker, MD;  Location: [facility] ORS;  Service: Orthopedics;  Laterality: Left;   HH repair     Fundoplication   JOINT REPLACEMENT Bilateral 2013,2014   total knees   LAPAROSCOPIC HYSTERECTOMY     LITHOTRIPSY     periprosthetic supracondylar fracture of left femur  02/16/2020   [facility]   POSTERIOR CERVICAL FUSION/FORAMINOTOMY N/A 03/02/2023   Procedure: C3-6 POSTERIOR CERVICAL LAMINECTOMY AND FUSION;  Surgeon: Lovenia Kim, MD;  Location: [facility] ORS;  Service: Neurosurgery;  Laterality: N/A;   TONSILLECTOMY     TOTAL KNEE REVISION Right 11/14/2019   Procedure: Revision patella and tibial polyethylene;  Surgeon: Kennedy Bucker, MD;  Location: [facility] ORS;  Service: Orthopedics;  Laterality: Right;   URETEROSCOPY     Social History:  reports that she has never smoked. She has been exposed to tobacco smoke. She has never used smokeless tobacco. She reports that she does not drink alcohol and does not use drugs.  Allergies  Allergen Reactions   Librium [Chlordiazepoxide] Shortness Of Breath   Reglan [Metoclopramide] Hives and Other (See Comments)    hallucinations    Vanilla Shortness Of Breath    Pt reports allergy to vanilla extract only   Aspirin Hives   Nsaids Rash    Rash/flares asthma issues.   Azithromycin Other (See Comments)    Unsure of what the reaction was.   Buprenorphine Hcl Other (See Comments)    Unsure of what the reaction was.    Flexeril [Cyclobenzaprine]     hallucinations   Morphine Other (See Comments)     Unsure of reaction   Sulfa Antibiotics     Other reaction(s): Unknown   Tolmetin     Other Reaction: Allergy   Amlodipine Besylate Itching and Rash    arms, stomach and forehead   Iron Nausea And Vomiting    Family History  Problem Relation Age of Onset   Hypertension Mother    Stroke Mother    Prostate cancer Father    Kidney Stones Father    Diabetes Brother    Hypertension Brother    Breast cancer Maternal Aunt    Kidney disease Neg Hx     Prior to Admission medications   Medication Sig Start Date End Date Taking? Authorizing Provider  Accu-Chek Softclix Lancets lancets Use as instructed once a daily DX R730.01 06/22/22   Beverely Risen  M, MD  acetaminophen (TYLENOL) 500 MG tablet Take 2 tablets (1,000 mg total) by mouth every 6 (six) hours as needed. 03/22/23 03/21/24  Joan Flores, PA-C  alendronate (FOSAMAX) 70 MG tablet Take 1 tablet (70 mg total) by mouth once a week. Take with a full glass of water on an empty stomach. 10/06/22   [name], NP  Blood Glucose Monitoring Suppl (ACCU-CHEK GUIDE ME) w/Device KIT Use as directed once a day DX R730.01 06/09/22   Lyndon Code, MD  busPIRone (BUSPAR) 10 MG tablet Take 1 tablet (10 mg total) by mouth 2 (two) times daily. 03/31/23   [name], NP  celecoxib (CELEBREX) 100 MG capsule Take 1 capsule (100 mg total) by mouth 2 (two) times daily. 03/29/23   [name], NP  clonazePAM (KLONOPIN) 1 MG tablet Take 0.5-1 tablets (0.5-1 mg total) by mouth 2 (two) times daily as needed for anxiety. 03/29/23   Abernathy, Arlyss Repress, NP  COMBIVENT RESPIMAT 20-100 MCG/ACT AERS respimat INHALE 1 PUFF BY MOUTH EVERY 6 HOURS 03/04/23   [name], NP  diclofenac Sodium (VOLTAREN) 1 % GEL Apply 4 g topically 4 (four) times daily. Patient taking differently: Apply 4 g topically 4 (four) times daily as needed (pain). 08/13/20   Lyndon Code, MD  diltiazem (CARDIZEM CD) 120 MG 24 hr capsule Take 1 capsule (120 mg total) by mouth daily.  04/01/23   [name], NP  DULoxetine (CYMBALTA) 60 MG capsule TAKE 1 CAPSULE(60 MG) BY MOUTH AT BEDTIME 04/02/23   Abernathy, Arlyss Repress, NP  fluticasone-salmeterol (WIXELA INHUB) 250-50 MCG/ACT AEPB Inhale 1 puff into the lungs in the morning and at bedtime. 12/21/22   Raechel Chute, MD  gabapentin (NEURONTIN) 300 MG capsule TAKE 1 CAPSULE(300 MG) BY MOUTH TWICE DAILY 03/31/23   [name], NP  gabapentin (NEURONTIN) 600 MG tablet TAKE 1 TABLET(600 MG) BY MOUTH TWICE DAILY 01/27/23   [name], NP  glucose blood (ACCU-CHEK GUIDE) test strip Use as instructed once daily Dx R73.01 06/09/22   Lyndon Code, MD  ipratropium-albuterol (DUONEB) 0.5-2.5 (3) MG/3ML SOLN Take 3 mLs by nebulization 4 (four) times daily. 08/07/22   Nyoka Cowden, MD  lidocaine (XYLOCAINE) 2 % solution Use as directed 10 mLs in the mouth or throat every 6 (six) hours as needed for mouth pain. 10/06/22   [name], NP  montelukast (SINGULAIR) 10 MG tablet TAKE 1 TABLET(10 MG) BY MOUTH AT BEDTIME 12/30/22   [name], NP  ondansetron (ZOFRAN) 4 MG tablet TAKE 1 TABLET BY MOUTH TWICE DAILY AS NEEDED 11/06/21   Lyndon Code, MD  pantoprazole (PROTONIX) 40 MG tablet Take 40 mg by mouth 2 (two) times daily.    [provider]  rOPINIRole (REQUIP) 0.5 MG tablet TAKE 2 TABLETS BY MOUTH TWICE DAILY FOR RESTLESS LEGS 04/02/23   [name], NP  senna (SENOKOT) 8.6 MG TABS tablet Take 1 tablet (8.6 mg total) by mouth 2 (two) times daily as needed for mild constipation. 03/03/23   Susanne Borders, PA  SUMAtriptan (IMITREX) 100 MG tablet Take 1 tablet (100 mg total) by mouth every 2 (two) hours as needed (for migraine headaches.). May repeat in 1 hours if headache persists or recurs. 09/18/21   [name], NP  tirzepatide [facility]) 2.5 MG/0.5ML Pen Inject 2.5 mg into the skin once a week. 11/03/22   [name], NP  tiZANidine (ZANAFLEX) 4 MG tablet TAKE 1 TABLET(4 MG) BY MOUTH EVERY 8  HOURS FOR UP TO  14 DAYS AS NEEDED FOR MUSCLE SPASMS 04/13/23   Lovenia Kim, MD  traZODone (DESYREL) 150 MG tablet TAKE 1 TABLET(150 MG) BY MOUTH AT BEDTIME 04/15/23   [name], NP    Physical Exam: Vitals:   04/16/23 1006 04/16/23 1007 04/16/23 1403 04/16/23 1502  BP: 128/62  126/64   Pulse: 61  62   Resp: 20  20   Temp: (!) 101.4 F (38.6 C)   98.5 F (36.9 C)  TempSrc: Oral   Oral  SpO2: 100%  100%   Weight:  59.2 kg    Height:  5\' 3"  (1.6 m)      General: Vital signs reviewed.  Patient is well-developed and well-nourished, in no acute distress and cooperative with exam.  Head: Normocephalic and atraumatic. Eyes: EOMI, conjunctivae normal, no scleral icterus.  Neck: Supple, trachea midline, normal ROM, mild induration and tenderness at the base of surgical scar. Cardiovascular: RRR, S1 normal, S2 normal, no murmurs, gallops, or rubs. Pulmonary/Chest: Clear to auscultation bilaterally, no wheezes, rales, or rhonchi. Abdominal: Soft, non-tender, non-distended, BS +,  Extremities: No lower extremity edema bilaterally,  pulses symmetric and intact bilaterally. No cyanosis or clubbing. Neurological: A&O x3, Strength is normal and symmetric bilaterally, cranial nerve II-XII are grossly intact, no focal motor deficit, sensory intact to light touch bilaterally.  Psychiatric: Normal mood and affect.   Data Reviewed: Prior data reviewed as mentioned above  Assessment and Plan: * Fever Patient was febrile at 101.1 on presentation, pretty much all ROS was negative.  Small tender spot at the end of incision line of her cervical spine surgery, CT cervical spine without any acute abnormality.  MRI was ordered by EDP-pending CT abdomen and pelvis was negative for any acute abnormality or infectious cause. Patient currently has SIRS response with fever and tachycardia, if we have a obvious source of infection then she met criteria for sepsis.  Received broad-spectrum antibiotics and  cultures were drawn. -Admit to medical telemetry -Holding further antibiotics -Follow-up cultures and MRI -Supportive care  Falls Patient had 2 recent falls over the past 2 days, CK lower at 29. Both seems mechanical, door dizziness or presyncopal symptoms. Pan imaging without any bony abnormality or acute fractures. -PT and OT evaluation  T2DM (type 2 diabetes mellitus) (HCC) Patient uses Mounjaro at home. -SSI  Chronic pain syndrome History of arthritis. -Continue home Celebrex and Neurontin  Asthma No upper respiratory symptoms or concern of exacerbation. -Continue home inhalers  Restless leg -Continue home Requip  Anxiety and depression -Continue home BuSpar and Cymbalta  Migraine No acute concern. -Continue home Imitrex as needed  Acid reflux -Continue home PPI  Electrolyte abnormality Patient was found to have hypomagnesemia at 1.5 and mild hyponatremia at 132. -Replacing magnesium -Giving some normal saline as she appears dry   Advance Care Planning:   Code Status: Full Code discussed with patient  Consults: None  Family Communication: None at bedside  Severity of Illness: The appropriate patient status for this patient is OBSERVATION. Observation status is judged to be reasonable and necessary in order to provide the required intensity of service to ensure the patient's safety. The patient's presenting symptoms, physical exam findings, and initial radiographic and laboratory data in the context of their medical condition is felt to place them at decreased risk for further clinical deterioration. Furthermore, it is anticipated that the patient will be medically stable for discharge from the hospital within 2 midnights of admission.   This record has been created  using Conservation officer, historic buildings. Errors have been sought and corrected,but may not always be located. Such creation errors do not reflect on the standard of care.   Author: Arnetha Courser,  MD 04/16/2023 3:49 PM  For on call review [email or web address].

## 2023-04-16 NOTE — Assessment & Plan Note (Signed)
No acute concern. -Continue home Imitrex as needed

## 2023-04-16 NOTE — Telephone Encounter (Signed)
She is in the ED at [facility]

## 2023-04-16 NOTE — Telephone Encounter (Signed)
HH notified.

## 2023-04-16 NOTE — Assessment & Plan Note (Signed)
Patient uses Mounjaro at home. -SSI

## 2023-04-16 NOTE — Assessment & Plan Note (Signed)
Patient had 2 recent falls over the past 2 days, CK lower at 29. Both seems mechanical, door dizziness or presyncopal symptoms. Pan imaging without any bony abnormality or acute fractures. -PT and OT evaluation

## 2023-04-16 NOTE — Assessment & Plan Note (Signed)
Patient was found to have hypomagnesemia at 1.5 and mild hyponatremia at 132. -Replacing magnesium -Giving some normal saline as she appears dry

## 2023-04-16 NOTE — Progress Notes (Signed)
CODE SEPSIS - PHARMACY COMMUNICATION  **Broad Spectrum Antibiotics should be administered within 1 hour of Sepsis diagnosis**  Time Code Sepsis Called/Page Received: 1/24 @ 1243  Antibiotics Ordered:  Cefepime 2g x 1 Vancomycin 1g x 1  Time of 1st antibiotic administration: 1/24 @ 1505  Additional action taken by pharmacy: NA  If necessary, Name of Provider/Nurse Contacted: NA  Effie Shy, PharmD Pharmacy Resident  04/16/2023 3:17 PM

## 2023-04-16 NOTE — ED Provider Notes (Addendum)
[facility] Provider Note    Event Date/Time   First MD Initiated Contact with Patient 04/16/23 0957     (approximate)   History   Fall   HPI  Summer Murphy is a 68 y.o. female with history of cervical surgery who comes in with concerns for a fall.  Patient reports having a fall a few days ago.  She states that she had another fall this morning.  She states that after this fall she was not able to get up off the ground.  She states that she is not sure if she hit her head but she is having some neck pain.  She denied feeling sick prior to this.  She denies any known cough, congestion, abdominal pain, chest pain.  She reports that she thinks she hit her bilateral knees as she is got some knee pain as well as upper neck pain from the fall.  She denies any lower back pain.  She also has some left hip pain.   Physical Exam   Triage Vital Signs: ED Triage Vitals  Encounter Vitals Group     BP 04/16/23 1006 128/62     Systolic BP Percentile --      Diastolic BP Percentile --      Pulse Rate 04/16/23 1006 61     Resp 04/16/23 1006 20     Temp 04/16/23 1006 (!) 101.4 F (38.6 C)     Temp Source 04/16/23 1006 Oral     SpO2 04/16/23 1006 100 %     Weight 04/16/23 1007 130 lb 8.2 oz (59.2 kg)     Height 04/16/23 1007 5\' 3"  (1.6 m)     Head Circumference --      Peak Flow --      Pain Score 04/16/23 1007 10     Pain Loc --      Pain Education --      Exclude from Growth Chart --     Most recent vital signs: Vitals:   04/16/23 1006  BP: 128/62  Pulse: 61  Resp: 20  Temp: (!) 101.4 F (38.6 C)  SpO2: 100%     General: Awake, no distress.  CV:  Good peripheral perfusion.  Resp:  Normal effort.  Abd:  No distention.  Soft and nontender Other:  Able to lift both legs up off the bed.  Some tenderness of the bilateral hips and left hip pain. + c spine tenderness, raised lump near incision scar. No TL spine tenderness.  ED Results / Procedures /  Treatments   Labs (all labs ordered are listed, but only abnormal results are displayed) Labs Reviewed  URINE CULTURE  CULTURE, BLOOD (ROUTINE X 2)  CULTURE, BLOOD (ROUTINE X 2)  RESP PANEL BY RT-PCR (RSV, FLU A&B, COVID)  [id number]  CBC WITH DIFFERENTIAL/PLATELET  COMPREHENSIVE METABOLIC PANEL  CK  URINALYSIS, ROUTINE W REFLEX MICROSCOPIC  LACTIC ACID, PLASMA  LACTIC ACID, PLASMA     EKG  My interpretation of EKG:  Sinus tachycardia rate of 123 without any ST elevation, T wave versions inferior lateral leads, left bundle branch block  RADIOLOGY I have reviewed the CT personally and no evidence of intracranial hemorrhage  PROCEDURES:  Critical Care performed: Yes, see critical care procedure note(s)  .Critical Care  Performed by: Concha Se, MD Authorized by: Concha Se, MD   Critical care provider statement:    Critical care time (minutes):  30   Critical care was  necessary to treat or prevent imminent or life-threatening deterioration of the following conditions:  Sepsis   Critical care was time spent personally by me on the following activities:  Development of treatment plan with patient or surrogate, discussions with consultants, evaluation of patient's response to treatment, examination of patient, ordering and review of laboratory studies, ordering and review of radiographic studies, ordering and performing treatments and interventions, pulse oximetry, re-evaluation of patient's condition and review of old charts    MEDICATIONS ORDERED IN ED: Medications  acetaminophen (TYLENOL) tablet 1,000 mg (has no administration in time range)     IMPRESSION / MDM / ASSESSMENT AND PLAN / ED COURSE  I reviewed the triage vital signs and the nursing notes.   Patient's presentation is most consistent with acute presentation with potential threat to life or bodily function.   Patient comes in with a fall.  CT imaging ordered evaluate for intracranial hemorrhage,  cervical fracture, pelvis fracture.  Patient does have some shortening of the left leg but she has had prior surgery there however I will get x-rays to confirm no fracture as well as CT to make sure no pelvic fracture given she reports not being able to ambulate.  She is febrile here but does not meet sepsis criteria will start off with blood works, COVID, flu, UA to further evaluate.  Patient's heart rate on EKG is elevated therefore patient meets sepsis criteria.  His white count is normal but procalcitonin is positive.  Unclear source.  Her CT imaging is overall reassuring.  She is able to lift both legs up off the bed I doubt cord compression.  Her CT scan not show a large bladder to suggest retention.  Her magnesium was slightly low at 1.5.  CBC shows stable hemoglobin.  CMP shows stable creatinine.  CK negative lactate normal.  Troponin slightly elevated COVID, flu are negative.  I will discuss with hospitalist for admission for sepsis, fall.  Will add on MRI cervical to make sure no abscess given pain here and prior surgery.   The patient is on the cardiac monitor to evaluate for evidence of arrhythmia and/or significant heart rate changes.      FINAL CLINICAL IMPRESSION(S) / ED DIAGNOSES   Final diagnoses:  Fall, initial encounter  Sepsis, due to unspecified organism, unspecified whether acute organ dysfunction present [facility])     Rx / DC Orders   ED Discharge Orders     None        Note:  This document was prepared using Dragon voice recognition software and may include unintentional dictation errors.   Concha Se, MD 04/16/23 1435    Concha Se, MD 04/16/23 [phone]

## 2023-04-16 NOTE — ED Triage Notes (Signed)
Presents via EMS from home s/p fall  States fell 2 days ago  But having bilateral knee pain and lower back neck

## 2023-04-16 NOTE — Telephone Encounter (Signed)
Just FYI patient has missed their appointment with [facility]. Patient was supposed to be seen but told Loraine Leriche, the physical therapist, she was out to lunch with friends, will be home around 2. He called around 2:20PM to let us know he had to go to another stop and will try again Monday.   Mark- [phone]

## 2023-04-16 NOTE — Assessment & Plan Note (Addendum)
Patient was febrile at 101.1 on presentation, pretty much all ROS was negative.  Small tender spot at the end of incision line of her cervical spine surgery, CT cervical spine without any acute abnormality.  MRI was ordered by EDP-pending CT abdomen and pelvis was negative for any acute abnormality or infectious cause. Patient currently has SIRS response with fever and tachycardia, if we have a obvious source of infection then she met criteria for sepsis.  Received broad-spectrum antibiotics and cultures were drawn. -Admit to medical telemetry -Holding further antibiotics -Follow-up cultures and MRI -Supportive care

## 2023-04-16 NOTE — Assessment & Plan Note (Signed)
-  Continue home Requip

## 2023-04-16 NOTE — Assessment & Plan Note (Signed)
-  Continue home PPI

## 2023-04-16 NOTE — Sepsis Progress Note (Signed)
eLink is following this Code Sepsis.

## 2023-04-16 NOTE — Assessment & Plan Note (Signed)
History of arthritis. -Continue home Celebrex and Neurontin

## 2023-04-16 NOTE — Progress Notes (Signed)
Referral has been placed to Care Management for assistance with medications, ADL's, transportation.

## 2023-04-17 ENCOUNTER — Inpatient Hospital Stay: Payer: 59 | Admitting: General Practice

## 2023-04-17 ENCOUNTER — Encounter: Payer: Self-pay | Admitting: Oncology

## 2023-04-17 ENCOUNTER — Other Ambulatory Visit: Payer: Self-pay

## 2023-04-17 ENCOUNTER — Encounter
Admission: EM | Disposition: A | Discharge status: Skilled Nursing Facility | Payer: Self-pay | Source: Home / Self Care | Attending: Student in an Organized Health Care Education/Training Program

## 2023-04-17 DIAGNOSIS — R7881 Bacteremia: Secondary | ICD-10-CM

## 2023-04-17 DIAGNOSIS — R6520 Severe sepsis without septic shock: Secondary | ICD-10-CM | POA: Diagnosis not present

## 2023-04-17 DIAGNOSIS — D509 Iron deficiency anemia, unspecified: Secondary | ICD-10-CM

## 2023-04-17 DIAGNOSIS — W19XXXA Unspecified fall, initial encounter: Secondary | ICD-10-CM | POA: Diagnosis not present

## 2023-04-17 DIAGNOSIS — Y92009 Unspecified place in unspecified non-institutional (private) residence as the place of occurrence of the external cause: Secondary | ICD-10-CM | POA: Diagnosis not present

## 2023-04-17 DIAGNOSIS — G2581 Restless legs syndrome: Secondary | ICD-10-CM | POA: Diagnosis present

## 2023-04-17 DIAGNOSIS — A4101 Sepsis due to Methicillin susceptible Staphylococcus aureus: Secondary | ICD-10-CM

## 2023-04-17 DIAGNOSIS — I82220 Acute embolism and thrombosis of inferior vena cava: Secondary | ICD-10-CM | POA: Diagnosis present

## 2023-04-17 DIAGNOSIS — F32A Depression, unspecified: Secondary | ICD-10-CM | POA: Diagnosis present

## 2023-04-17 DIAGNOSIS — A419 Sepsis, unspecified organism: Secondary | ICD-10-CM | POA: Diagnosis not present

## 2023-04-17 DIAGNOSIS — M4620 Osteomyelitis of vertebra, site unspecified: Secondary | ICD-10-CM | POA: Diagnosis not present

## 2023-04-17 DIAGNOSIS — M4646 Discitis, unspecified, lumbar region: Secondary | ICD-10-CM | POA: Diagnosis not present

## 2023-04-17 DIAGNOSIS — B954 Other streptococcus as the cause of diseases classified elsewhere: Secondary | ICD-10-CM

## 2023-04-17 DIAGNOSIS — T8142XA Infection following a procedure, deep incisional surgical site, initial encounter: Secondary | ICD-10-CM | POA: Diagnosis present

## 2023-04-17 DIAGNOSIS — Z1152 Encounter for screening for COVID-19: Secondary | ICD-10-CM | POA: Diagnosis not present

## 2023-04-17 DIAGNOSIS — W010XXA Fall on same level from slipping, tripping and stumbling without subsequent striking against object, initial encounter: Secondary | ICD-10-CM | POA: Diagnosis present

## 2023-04-17 DIAGNOSIS — E871 Hypo-osmolality and hyponatremia: Secondary | ICD-10-CM | POA: Diagnosis present

## 2023-04-17 DIAGNOSIS — K6812 Psoas muscle abscess: Secondary | ICD-10-CM | POA: Diagnosis present

## 2023-04-17 DIAGNOSIS — J4489 Other specified chronic obstructive pulmonary disease: Secondary | ICD-10-CM | POA: Diagnosis present

## 2023-04-17 DIAGNOSIS — E876 Hypokalemia: Secondary | ICD-10-CM | POA: Diagnosis present

## 2023-04-17 DIAGNOSIS — E1169 Type 2 diabetes mellitus with other specified complication: Secondary | ICD-10-CM | POA: Diagnosis present

## 2023-04-17 DIAGNOSIS — G9341 Metabolic encephalopathy: Secondary | ICD-10-CM | POA: Diagnosis not present

## 2023-04-17 DIAGNOSIS — Z981 Arthrodesis status: Secondary | ICD-10-CM | POA: Diagnosis not present

## 2023-04-17 DIAGNOSIS — Y838 Other surgical procedures as the cause of abnormal reaction of the patient, or of later complication, without mention of misadventure at the time of the procedure: Secondary | ICD-10-CM | POA: Diagnosis present

## 2023-04-17 DIAGNOSIS — Z7901 Long term (current) use of anticoagulants: Secondary | ICD-10-CM | POA: Diagnosis not present

## 2023-04-17 DIAGNOSIS — T8463XA Infection and inflammatory reaction due to internal fixation device of spine, initial encounter: Secondary | ICD-10-CM | POA: Diagnosis not present

## 2023-04-17 DIAGNOSIS — B9561 Methicillin susceptible Staphylococcus aureus infection as the cause of diseases classified elsewhere: Secondary | ICD-10-CM | POA: Diagnosis not present

## 2023-04-17 DIAGNOSIS — R509 Fever, unspecified: Secondary | ICD-10-CM | POA: Diagnosis present

## 2023-04-17 DIAGNOSIS — D638 Anemia in other chronic diseases classified elsewhere: Secondary | ICD-10-CM | POA: Diagnosis present

## 2023-04-17 DIAGNOSIS — G253 Myoclonus: Secondary | ICD-10-CM | POA: Diagnosis present

## 2023-04-17 DIAGNOSIS — T8149XA Infection following a procedure, other surgical site, initial encounter: Secondary | ICD-10-CM | POA: Diagnosis not present

## 2023-04-17 DIAGNOSIS — M4622 Osteomyelitis of vertebra, cervical region: Secondary | ICD-10-CM | POA: Diagnosis not present

## 2023-04-17 DIAGNOSIS — R296 Repeated falls: Secondary | ICD-10-CM | POA: Diagnosis not present

## 2023-04-17 DIAGNOSIS — Z7983 Long term (current) use of bisphosphonates: Secondary | ICD-10-CM | POA: Diagnosis not present

## 2023-04-17 DIAGNOSIS — G894 Chronic pain syndrome: Secondary | ICD-10-CM | POA: Diagnosis present

## 2023-04-17 DIAGNOSIS — F419 Anxiety disorder, unspecified: Secondary | ICD-10-CM | POA: Diagnosis present

## 2023-04-17 DIAGNOSIS — J479 Bronchiectasis, uncomplicated: Secondary | ICD-10-CM | POA: Diagnosis present

## 2023-04-17 DIAGNOSIS — M4626 Osteomyelitis of vertebra, lumbar region: Secondary | ICD-10-CM | POA: Diagnosis present

## 2023-04-17 DIAGNOSIS — I10 Essential (primary) hypertension: Secondary | ICD-10-CM | POA: Diagnosis present

## 2023-04-17 DIAGNOSIS — G992 Myelopathy in diseases classified elsewhere: Secondary | ICD-10-CM | POA: Diagnosis present

## 2023-04-17 DIAGNOSIS — K219 Gastro-esophageal reflux disease without esophagitis: Secondary | ICD-10-CM | POA: Diagnosis present

## 2023-04-17 HISTORY — PX: CERVICAL WOUND DEBRIDEMENT: SHX6694

## 2023-04-17 LAB — PREPARE RBC (CROSSMATCH)

## 2023-04-17 LAB — BLOOD CULTURE ID PANEL (REFLEXED) - BCID2

## 2023-04-17 LAB — CBC
HCT: 23.7 % — ABNORMAL LOW (ref 36.0–46.0)
HCT: 22.2 % — ABNORMAL LOW (ref 36.0–46.0)
Hemoglobin: 7.5 g/dL — ABNORMAL LOW (ref 12.0–15.0)
Hemoglobin: 7.3 g/dL — ABNORMAL LOW (ref 12.0–15.0)
MCH: 22.4 pg — ABNORMAL LOW (ref 26.0–34.0)
MCH: 22.5 pg — ABNORMAL LOW (ref 26.0–34.0)
MCHC: 31.6 g/dL (ref 30.0–36.0)
MCHC: 32.9 g/dL (ref 30.0–36.0)
MCV: 70.7 fL — ABNORMAL LOW (ref 80.0–100.0)
MCV: 68.5 fL — ABNORMAL LOW (ref 80.0–100.0)
Platelets: 405 10*3/uL — ABNORMAL HIGH (ref 150–400)
Platelets: 392 10*3/uL (ref 150–400)
RBC: 3.35 MIL/uL — ABNORMAL LOW (ref 3.87–5.11)
RBC: 3.24 MIL/uL — ABNORMAL LOW (ref 3.87–5.11)
RDW: 22.9 % — ABNORMAL HIGH (ref 11.5–15.5)
RDW: 22.9 % — ABNORMAL HIGH (ref 11.5–15.5)
WBC: 10.1 10*3/uL (ref 4.0–10.5)
WBC: 9.9 10*3/uL (ref 4.0–10.5)
nRBC: 0 % (ref 0.0–0.2)
nRBC: 0 % (ref 0.0–0.2)

## 2023-04-17 LAB — GLUCOSE, CAPILLARY
Glucose-Capillary: 89 mg/dL (ref 70–99)
Glucose-Capillary: 77 mg/dL (ref 70–99)
Glucose-Capillary: 128 mg/dL — ABNORMAL HIGH (ref 70–99)
Glucose-Capillary: 158 mg/dL — ABNORMAL HIGH (ref 70–99)
Glucose-Capillary: 135 mg/dL — ABNORMAL HIGH (ref 70–99)

## 2023-04-17 LAB — BASIC METABOLIC PANEL
Anion gap: 9 (ref 5–15)
BUN: 16 mg/dL (ref 8–23)
CO2: 23 mmol/L (ref 22–32)
Calcium: 7.8 mg/dL — ABNORMAL LOW (ref 8.9–10.3)
Chloride: 102 mmol/L (ref 98–111)
Creatinine, Ser: 0.49 mg/dL (ref 0.44–1.00)
GFR, Estimated: >60 mL/min (ref >60)
Glucose, Bld: 103 mg/dL — ABNORMAL HIGH (ref 70–99)
Potassium: 3.4 mmol/L — ABNORMAL LOW (ref 3.5–5.1)
Sodium: 134 mmol/L — ABNORMAL LOW (ref 135–145)

## 2023-04-17 LAB — IRON AND TIBC
Iron: 9 ug/dL — ABNORMAL LOW (ref 28–170)
Saturation Ratios: 7 % — ABNORMAL LOW (ref 10.4–31.8)
TIBC: 139 ug/dL — ABNORMAL LOW (ref 250–450)
UIBC: 130 ug/dL

## 2023-04-17 LAB — HEMOGLOBIN AND HEMATOCRIT, BLOOD
HCT: 27.1 % — ABNORMAL LOW (ref 36.0–46.0)
Hemoglobin: 8.9 g/dL — ABNORMAL LOW (ref 12.0–15.0)

## 2023-04-17 LAB — MAGNESIUM: Magnesium: 2.3 mg/dL (ref 1.7–2.4)

## 2023-04-17 LAB — CK: Total CK: 15 U/L — ABNORMAL LOW (ref 38–234)

## 2023-04-17 LAB — FERRITIN: Ferritin: 135 ng/mL (ref 11–307)

## 2023-04-17 SURGERY — CERVICAL WOUND DEBRIDEMENT
Anesthesia: General | Site: Spine Cervical

## 2023-04-17 MED ORDER — FENTANYL CITRATE (PF) 100 MCG/2ML IJ SOLN
INTRAMUSCULAR | Status: DC | PRN
  Administered 2023-04-17 (×2): 50 ug via INTRAVENOUS

## 2023-04-17 MED ORDER — POTASSIUM CHLORIDE CRYS ER 20 MEQ PO TBCR
40.0000 meq | EXTENDED_RELEASE_TABLET | Freq: Once | ORAL | Status: AC
  Administered 2023-04-17: 40 meq via ORAL
  Filled 2023-04-17: qty 2

## 2023-04-17 MED ORDER — FENTANYL CITRATE (PF) 100 MCG/2ML IJ SOLN
INTRAMUSCULAR | Status: AC
  Filled 2023-04-17: qty 2

## 2023-04-17 MED ORDER — SUCCINYLCHOLINE CHLORIDE 200 MG/10ML IV SOSY
PREFILLED_SYRINGE | INTRAVENOUS | Status: AC
Start: 2023-04-17 — End: ?
  Filled 2023-04-17: qty 10

## 2023-04-17 MED ORDER — CEFAZOLIN SODIUM 1 G IJ SOLR
INTRAMUSCULAR | Status: AC
  Filled 2023-04-17: qty 20

## 2023-04-17 MED ORDER — HYDROMORPHONE HCL 1 MG/ML IJ SOLN
1.0000 mg | Freq: Once | INTRAMUSCULAR | Status: AC
Start: 2023-04-17 — End: 2023-04-17
  Administered 2023-04-17: 1 mg via INTRAVENOUS
  Filled 2023-04-17: qty 1

## 2023-04-17 MED ORDER — ROCURONIUM BROMIDE 100 MG/10ML IV SOLN
INTRAVENOUS | Status: DC | PRN
  Administered 2023-04-17: 10 mg via INTRAVENOUS

## 2023-04-17 MED ORDER — SUCCINYLCHOLINE CHLORIDE 200 MG/10ML IV SOSY
PREFILLED_SYRINGE | INTRAVENOUS | Status: DC | PRN
  Administered 2023-04-17: 100 mg via INTRAVENOUS

## 2023-04-17 MED ORDER — DEXTROSE-SODIUM CHLORIDE 5-0.2 % IV SOLN
INTRAVENOUS | Status: DC | PRN
  Administered 2023-04-17: 50 mL/h via INTRAVENOUS

## 2023-04-17 MED ORDER — PHENYLEPHRINE 80 MCG/ML (10ML) SYRINGE FOR IV PUSH (FOR BLOOD PRESSURE SUPPORT)
PREFILLED_SYRINGE | INTRAVENOUS | Status: DC | PRN
  Administered 2023-04-17: 80 ug via INTRAVENOUS

## 2023-04-17 MED ORDER — ONDANSETRON HCL 4 MG/2ML IJ SOLN
INTRAMUSCULAR | Status: AC
  Filled 2023-04-17: qty 2

## 2023-04-17 MED ORDER — 0.9 % SODIUM CHLORIDE (POUR BTL) OPTIME
TOPICAL | Status: DC | PRN
  Administered 2023-04-17: 1000 mL

## 2023-04-17 MED ORDER — METOPROLOL TARTRATE 5 MG/5ML IV SOLN
INTRAVENOUS | Status: AC
  Filled 2023-04-17: qty 5

## 2023-04-17 MED ORDER — CEFAZOLIN SODIUM-DEXTROSE 2-4 GM/100ML-% IV SOLN
2.0000 g | Freq: Three times a day (TID) | INTRAVENOUS | Status: DC
  Administered 2023-04-17 – 2023-04-28 (×34): 2 g via INTRAVENOUS
  Filled 2023-04-17 (×33): qty 100

## 2023-04-17 MED ORDER — LIDOCAINE HCL (PF) 2 % IJ SOLN
INTRAMUSCULAR | Status: AC
Start: 2023-04-17 — End: ?
  Filled 2023-04-17: qty 5

## 2023-04-17 MED ORDER — ROCURONIUM BROMIDE 10 MG/ML (PF) SYRINGE
PREFILLED_SYRINGE | INTRAVENOUS | Status: AC
Start: 2023-04-17 — End: ?
  Filled 2023-04-17: qty 10

## 2023-04-17 MED ORDER — ONDANSETRON HCL 4 MG/2ML IJ SOLN
INTRAMUSCULAR | Status: DC | PRN
  Administered 2023-04-17: 4 mg via INTRAVENOUS

## 2023-04-17 MED ORDER — OXYCODONE HCL 5 MG PO TABS
5.0000 mg | ORAL_TABLET | Freq: Once | ORAL | Status: DC | PRN
Start: 2023-04-17 — End: 2023-04-17

## 2023-04-17 MED ORDER — SODIUM CHLORIDE 0.9% IV SOLUTION: Freq: Once | INTRAVENOUS | Status: AC

## 2023-04-17 MED ORDER — DEXTROSE 5 % IV SOLN: INTRAVENOUS | Status: AC

## 2023-04-17 MED ORDER — FE FUM-VIT C-VIT B12-FA 460-60-0.01-1 MG PO CAPS
1.0000 | ORAL_CAPSULE | Freq: Two times a day (BID) | ORAL | Status: DC
  Administered 2023-04-17 – 2023-04-28 (×21): 1 via ORAL
  Filled 2023-04-17 (×23): qty 1

## 2023-04-17 MED ORDER — FENTANYL CITRATE (PF) 100 MCG/2ML IJ SOLN
25.0000 ug | INTRAMUSCULAR | Status: DC | PRN
  Administered 2023-04-17 (×2): 25 ug via INTRAVENOUS

## 2023-04-17 MED ORDER — PROPOFOL 10 MG/ML IV BOLUS
INTRAVENOUS | Status: DC | PRN
  Administered 2023-04-17: 100 mg via INTRAVENOUS

## 2023-04-17 MED ORDER — DEXAMETHASONE SODIUM PHOSPHATE 10 MG/ML IJ SOLN
INTRAMUSCULAR | Status: AC
  Filled 2023-04-17: qty 1

## 2023-04-17 MED ORDER — VANCOMYCIN HCL 1000 MG IV SOLR
INTRAVENOUS | Status: DC | PRN
  Administered 2023-04-17: 1000 mg via TOPICAL

## 2023-04-17 MED ORDER — BUPIVACAINE-EPINEPHRINE (PF) 0.5% -1:200000 IJ SOLN
INTRAMUSCULAR | Status: DC | PRN
  Administered 2023-04-17: 6 mL via PERINEURAL

## 2023-04-17 MED ORDER — LACTATED RINGERS IV SOLN: INTRAVENOUS | Status: DC | PRN

## 2023-04-17 MED ORDER — LACTATED RINGERS IV BOLUS
1000.0000 mL | Freq: Once | INTRAVENOUS | Status: AC
  Administered 2023-04-17: 1000 mL via INTRAVENOUS

## 2023-04-17 MED ORDER — LIDOCAINE HCL (CARDIAC) PF 100 MG/5ML IV SOSY
PREFILLED_SYRINGE | INTRAVENOUS | Status: DC | PRN
  Administered 2023-04-17: 50 mg via INTRAVENOUS

## 2023-04-17 MED ORDER — IRRISEPT - 450ML BOTTLE WITH 0.05% CHG IN STERILE WATER, USP 99.95% OPTIME
TOPICAL | Status: DC | PRN
  Administered 2023-04-17: 450 mL

## 2023-04-17 MED ORDER — DEXAMETHASONE SODIUM PHOSPHATE 10 MG/ML IJ SOLN
INTRAMUSCULAR | Status: DC | PRN
  Administered 2023-04-17: 10 mg via INTRAVENOUS

## 2023-04-17 MED ORDER — PHENYLEPHRINE 80 MCG/ML (10ML) SYRINGE FOR IV PUSH (FOR BLOOD PRESSURE SUPPORT)
PREFILLED_SYRINGE | INTRAVENOUS | Status: AC
Start: 2023-04-17 — End: ?
  Filled 2023-04-17: qty 10

## 2023-04-17 MED ORDER — DEXMEDETOMIDINE HCL IN NACL 200 MCG/50ML IV SOLN
INTRAVENOUS | Status: DC | PRN
  Administered 2023-04-17: 10 ug via INTRAVENOUS

## 2023-04-17 MED ORDER — OXYCODONE HCL 5 MG/5ML PO SOLN: 5.0000 mg | Freq: Once | ORAL | Status: DC | PRN

## 2023-04-17 MED ORDER — CEFAZOLIN SODIUM-DEXTROSE 2-3 GM-%(50ML) IV SOLR
INTRAVENOUS | Status: DC | PRN
  Administered 2023-04-17: 2 g via INTRAVENOUS

## 2023-04-17 SURGICAL SUPPLY — 61 items
BLADE BOVIE TIP EXT 4 (BLADE) IMPLANT
BUR NEURO DRILL SOFT 3.0X3.8M (BURR) IMPLANT
CHLORAPREP W/TINT 26 (MISCELLANEOUS) ×6 IMPLANT
COUNTER NEEDLE 20/40 LG (NEEDLE) ×4 IMPLANT
COVER LIGHT HANDLE STERIS (MISCELLANEOUS) ×4 IMPLANT
CUP MEDICINE 2OZ PLAST GRAD ST (MISCELLANEOUS) ×4 IMPLANT
DEVICE DSSCT PLSMBLD 3.0S LGHT (MISCELLANEOUS) IMPLANT
DRAPE LAPAROTOMY 100X77 ABD (DRAPES) ×2 IMPLANT
DRAPE MICROSCOPE SPINE 48X150 (DRAPES) IMPLANT
DRAPE SURG 17X11 SM STRL (DRAPES) ×2 IMPLANT
DRSG TEGADERM 4X4.75 (GAUZE/BANDAGES/DRESSINGS) IMPLANT
ELECT CAUTERY BLADE TIP 2.5 (TIP) ×1 IMPLANT
ELECT EZSTD 165MM 6.5IN (MISCELLANEOUS) ×1 IMPLANT
ELECT REM PT RETURN 9FT ADLT (ELECTROSURGICAL) ×1 IMPLANT
ELECTRODE CAUTERY BLDE TIP 2.5 (TIP) ×2 IMPLANT
ELECTRODE EZSTD 165MM 6.5IN (MISCELLANEOUS) IMPLANT
ELECTRODE REM PT RTRN 9FT ADLT (ELECTROSURGICAL) ×2 IMPLANT
GAUZE 4X4 16PLY ~~LOC~~+RFID DBL (SPONGE) ×2 IMPLANT
GAUZE SPONGE 4X4 12PLY STRL (GAUZE/BANDAGES/DRESSINGS) ×2 IMPLANT
GAUZE XEROFORM 1X8 LF (GAUZE/BANDAGES/DRESSINGS) ×2 IMPLANT
GLOVE BIOGEL PI IND STRL 6.5 (GLOVE) ×2 IMPLANT
GLOVE BIOGEL PI IND STRL 7.0 (GLOVE) ×2 IMPLANT
GLOVE BIOGEL PI IND STRL 8 (GLOVE) ×2 IMPLANT
GLOVE SURG SYN 6.5 ES PF (GLOVE) ×1 IMPLANT
GLOVE SURG SYN 6.5 PF PI (GLOVE) ×2 IMPLANT
GLOVE SURG SYN 8.0 (GLOVE) ×2 IMPLANT
GLOVE SURG SYN 8.0 PF PI (GLOVE) ×4 IMPLANT
GOWN SRG LRG LVL 4 IMPRV REINF (GOWNS) ×2 IMPLANT
GOWN STRL REUS W/ TWL XL LVL3 (GOWN DISPOSABLE) ×4 IMPLANT
GRADUATE 1200CC STRL 31836 (MISCELLANEOUS) ×4 IMPLANT
HEMOVAC 400ML (MISCELLANEOUS) IMPLANT
IV NS IRRIG 3000ML ARTHROMATIC (IV SOLUTION) ×2 IMPLANT
JET LAVAGE IRRISEPT WOUND (IRRIGATION / IRRIGATOR) ×1 IMPLANT
KIT DRAIN HEMOVAC JP 7FR 400ML (MISCELLANEOUS) IMPLANT
KIT TURNOVER KIT A (KITS) ×2 IMPLANT
KIT WILSON FRAME (KITS) ×2 IMPLANT
LAVAGE JET IRRISEPT WOUND (IRRIGATION / IRRIGATOR) IMPLANT
MANIFOLD NEPTUNE II (INSTRUMENTS) ×2 IMPLANT
MARKER SKIN DUAL TIP RULER LAB (MISCELLANEOUS) ×6 IMPLANT
NDL SAFETY ECLIPSE 18X1.5 (NEEDLE) ×2 IMPLANT
NS IRRIG 1000ML POUR BTL (IV SOLUTION) ×2 IMPLANT
PACK LAMINECTOMY ARMC (PACKS) ×2 IMPLANT
PAD ARMBOARD 7.5X6 YLW CONV (MISCELLANEOUS) ×4 IMPLANT
PLASMABLADE 3.0S W/LIGHT (MISCELLANEOUS) IMPLANT
PULSAVAC PLUS IRRIG FAN TIP (DISPOSABLE) IMPLANT
STAPLER SKIN PROX 35W (STAPLE) IMPLANT
SURGIFLO W/THROMBIN 8M KIT (HEMOSTASIS) IMPLANT
SUT ETHILON 3-0 FS-10 30 BLK (SUTURE) IMPLANT
SUT NURALON 4 0 TR CR/8 (SUTURE) IMPLANT
SUT PDS 2-0 27IN (SUTURE) IMPLANT
SUT PDS AB 0 CT1 27 (SUTURE) ×2 IMPLANT
SUT PDS II 3-0 (SUTURE) IMPLANT
SUT VIC AB 0 CT1 27XCR 8 STRN (SUTURE) ×4 IMPLANT
SUTURE EHLN 3-0 FS-10 30 BLK (SUTURE) IMPLANT
SYR 10ML LL (SYRINGE) ×6 IMPLANT
SYR 30ML LL (SYRINGE) ×4 IMPLANT
TIP FAN IRRIG PULSAVAC PLUS (DISPOSABLE) IMPLANT
TOWEL OR 17X26 4PK STRL BLUE (TOWEL DISPOSABLE) ×6 IMPLANT
TRAP FLUID SMOKE EVACUATOR (MISCELLANEOUS) ×2 IMPLANT
TUBING CONNECTING 10 (TUBING) ×6 IMPLANT
WATER STERILE IRR 500ML POUR (IV SOLUTION) ×2 IMPLANT

## 2023-04-17 NOTE — Transfer of Care (Signed)
Immediate Anesthesia Transfer of Care Note  Patient: Summer Murphy  Procedure(s) Performed: CERVICAL WOUND DEBRIDEMENT (Spine Cervical)  Patient Location: PACU  Anesthesia Type:General  Level of Consciousness: awake, alert , and oriented  Airway & Oxygen Therapy: Patient Spontanous Breathing and Patient connected to nasal cannula oxygen  Post-op Assessment: Report given to RN and Post -op Vital signs reviewed and stable  Post vital signs: Reviewed and stable  Last Vitals:  Vitals Value Taken Time  BP 150/78 04/17/23 1404  Temp    Pulse 92 04/17/23 1412  Resp 18 04/17/23 1412  SpO2 91 % 04/17/23 1412  Vitals shown include unfiled device data.  Last Pain:  Vitals:   04/17/23 1125  TempSrc: Oral  PainSc:       Patients Stated Pain Goal: 4 (04/16/23 2317)  Complications: No notable events documented.

## 2023-04-17 NOTE — Interval H&P Note (Signed)
History and Physical Interval Note:  04/17/2023 11:20 AM  Summer Murphy  has presented today for surgery, with the diagnosis of wound infection.  The various methods of treatment have been discussed with the patient and family. After consideration of risks, benefits and other options for treatment, the patient has consented to  Procedure(s): CERVICAL WOUND DEBRIDEMENT (N/A) as a surgical intervention.  The patient's history has been reviewed, patient examined, no change in status, stable for surgery.  I have reviewed the patient's chart and labs.  Questions were answered to the patient's satisfaction.     Lucy Chris

## 2023-04-17 NOTE — Op Note (Signed)
Operative Note   SURGERY DATE:  04/17/2023   PRE-OP DIAGNOSIS: Wound infection   POST-OP DIAGNOSIS: Post-Op Diagnosis Codes: Wound infection   Procedure(s) with comments: Posterior cervical wound irrigation and debridement   SURGEON:     * Nathaniel Man, MD     ANESTHESIA: General    OPERATIVE FINDINGS: Mild superficial infection   Indication: Ms Summer Murphy underwent a posterior cervical decompression and fusion approximately 6 weeks ago but was admitted to the hospital yesterday after a fall.  She was found to be febrile and have concern for sepsis and therefore blood cultures were taken showing MSSA bacteremia.  MRI of the cervical spine did show fluid collection and therefore we did discuss with her the need for wound exploration and possible I&D.  She understood the risk of bleeding, infection, risk of anesthesia.    Procedure The patient was brought to the OR after informed consent was obtained.  She was given general anesthesia and intubated by the anesthesia service. Vascular access lines were placed.The patient was then placed prone on a Wilson frame ensuring all pressure points were padded. A time-out was performed per protocol.    The patient was sterilely prepped and draped. The previous incision was instilled with local anesthetic with epinephrine.  The skin was opened sharply and the dissection taken to the fascia.  There was immediate egress of fluid beneath the skin that showed some mild purulence.  The remainder of the fluid appeared more seromatous.  We did take a culture of the fluid.  The fascia was incised and we did sharply dissect deeply but no further infection was seen.  We encountered significant scar tissue.  The tissue appeared healthy.  Given this, we moved to closure.      A hemovac drain was placed and tunneled above the fascia..  The muscle was then closed using 0 PDS followed by the fascia with 0 PDS. Vancomycin powder was placed in the wound.  Next,  multiple subcutaneous and dermal layers were closed with 2-0 PDS until the epidermis was well approximated. The skin was closed with staples. A dressing was applied.   The patient was returned to supine position and extubated by the anesthesia service. The patient was then taken to the PACU for post-operative care where she was moving extremities symmetrically.   ESTIMATED BLOOD LOSS:   100 cc   SPECIMENS Superficial culture     I performed the case in its entirety     Lucy Chris, MD [phone]

## 2023-04-17 NOTE — Progress Notes (Signed)
   04/16/23 2019  Assess: MEWS Score  Temp 98.3 F (36.8 C)  BP (!) 113/46  MAP (mmHg) (!) 62  Pulse Rate (!) 115  Resp 20  Level of Consciousness Alert  SpO2 97 %  O2 Device Room Air  Assess: MEWS Score  MEWS Temp 0  MEWS Systolic 0  MEWS Pulse 2  MEWS RR 0  MEWS LOC 0  MEWS Score 2  MEWS Score Color Yellow  Assess: if the MEWS score is Yellow or Red  Were vital signs accurate and taken at a resting state? Yes  Does the patient meet 2 or more of the SIRS criteria? Yes  Does the patient have a confirmed or suspected source of infection? No  MEWS guidelines implemented  Yes, yellow  Treat  MEWS Interventions Considered administering scheduled or prn medications/treatments as ordered  Take Vital Signs  Increase Vital Sign Frequency  Yellow: Q2hr x1, continue Q4hrs until patient remains green for 12hrs  Escalate  MEWS: Escalate Yellow: Discuss with charge nurse and consider notifying provider and/or RRT  Notify: Charge Nurse/RN  Name of Charge Nurse/RN Notified Lupita Leash, RN  Assess: SIRS CRITERIA  SIRS Temperature  0  SIRS Respirations  0  SIRS Pulse 1  SIRS WBC 0  SIRS Score Sum  1

## 2023-04-17 NOTE — Hospital Course (Addendum)
Summer Murphy is a 68 y.o. female with medical history significant of type 2 diabetes, asthma, arthritis s/p cervical fusion and anxiety came to ED after having a fall, she was trying to reach her walker which slipped and she fell and unable to get out of the floor easily.  Patient had another fall 2 days ago when she slipped from her chair.  She was complaining of pain in her knees and neck.   Patient with no recent illnesses, no upper respiratory symptoms, no headache, had 2 episodes of diarrhea 2 days ago which has been resolved.  No nausea or vomiting.  Appetite and p.o. intake at baseline.  No urinary symptoms No recent travel or concern of tick bite.  On presentation she was febrile at 101.1 and tachycardic, labs with no leukocytosis, hemoglobin 10.7 which is at her baseline, mild hyponatremia with sodium at 132, albumin 2.5, CK29, lactic acid 1, magnesium 1.5, procalcitonin 0.40. UA and urine cultures are pending, Blood cultures were drawn.   Multiple imaging which include CT head, CT cervical spine, DG knee, DG hip and pelvis and CT scan of abdomen and pelvis was negative for any acute abnormality.  On exam she has a small indurated spot at the base of her cervical fusion incision with point tenderness, MRI was ordered and it shows some fluid collection, abscess cannot be ruled out.  1/25: Afebrile this morning, blood cultures growing Staph aureus-pending susceptibility.  Hemoglobin decreased to 7.3 without any obvious bleeding. Type and screen was done and it shows positive antibody-ordered 1 unit of PRBC and also consulted neurosurgery as there is no other obvious source of infection.  Started on cefazolin as there is no resistance detected. Patient now meets the criteria for sepsis with fever and tachycardia, Staph aureus bacteremia.  Echocardiogram also ordered. Neurosurgery is going to take her to the OR for washout.  1/26: Preliminary postprocedural culture with no organism, anemia  panel consistent with anemia of chronic disease and iron deficiency.  Started on supplement. Repeat blood cultures ordered.  Urine cultures with no growth  1/27: Repeat blood cultures with no growth in 24-hour, neurosurgery obtained lumbar MRI as she was complaining of lower back and bilateral leg pain, it was motion degraded study but did show multilevel degenerative changes and neural foraminal narrowing. Cervical wound culture with some Staph aureus.  TTE with EF of 45 to 50%, some concern of regional wall motion abnormalities likely due to LBBB, discussed with cardiology and they are recommending outpatient follow-up.  Appointment was set with Summer Murphy in February. Pending final recommendations from ID.  1/28: Hemodynamically stable, continue to have lower back and leg pain.  Cervical drain remain in place.  ID is recommending at least 6 weeks of IV cefazolin followed by another 4 to 6 weeks of p.o. antibiotics.  PICC line was placed today.  Repeat blood cultures negative for 48 hours.

## 2023-04-17 NOTE — Progress Notes (Signed)
PT Cancellation Note  Patient Details Name: Summer Murphy MRN: [id number] DOB: [birth year]   Cancelled Treatment:    Reason Eval/Treat Not Completed: Patient not medically ready. PT orders received and pt chart reviewed. Per chart review, neurosurgery recommending I&D in OR today. Will follow up with PT evaluation when pt is medically appropriate.   Vira Blanco, PT, DPT 9:41 AM,04/17/23 Physical Therapist - [place] [facility]

## 2023-04-17 NOTE — Progress Notes (Signed)
   04/17/23 1125  Assess: MEWS Score  Temp 98.8 F (37.1 C)  BP (!) 121/57  Pulse Rate (!) 114  Resp 18  SpO2 98 %  O2 Device Room Air  Patient Activity (if Appropriate) In bed  Assess: MEWS Score  MEWS Temp 0  MEWS Systolic 0  MEWS Pulse 2  MEWS RR 0  MEWS LOC 0  MEWS Score 2  MEWS Score Color Yellow  Assess: if the MEWS score is Yellow or Red  Were vital signs accurate and taken at a resting state? Yes  Does the patient meet 2 or more of the SIRS criteria? No  Does the patient have a confirmed or suspected source of infection? No  MEWS guidelines implemented  Yes, yellow  Treat  MEWS Interventions Considered administering scheduled or prn medications/treatments as ordered  Take Vital Signs  Increase Vital Sign Frequency  Yellow: Q2hr x1, continue Q4hrs until patient remains green for 12hrs  Escalate  MEWS: Escalate Yellow: Discuss with charge nurse and consider notifying provider and/or RRT  Notify: Charge Nurse/RN  Name of Charge Nurse/RN Notified Tiffanie RN  Provider Notification  Provider Name/Title Nelson Chimes MD  Date Provider Notified 04/17/23  Time Provider Notified 1125  Method of Notification Face-to-face  Notification Reason Other (Comment) (Yellow MEWS)  Provider response See new orders  Date of Provider Response 04/17/23  Time of Provider Response 1125  Assess: SIRS CRITERIA  SIRS Temperature  0  SIRS Respirations  0  SIRS Pulse 1  SIRS WBC 0  SIRS Score Sum  1

## 2023-04-17 NOTE — Anesthesia Procedure Notes (Signed)
Procedure Name: Intubation Date/Time: 04/17/2023 12:45 PM  Performed by: Leonides Cave, CRNAPre-anesthesia Checklist: Patient identified, Patient being monitored, Timeout performed, Emergency Drugs available and Suction available Patient Re-evaluated:Patient Re-evaluated prior to induction Oxygen Delivery Method: Circle system utilized Preoxygenation: Pre-oxygenation with 100% oxygen Induction Type: IV induction Ventilation: Mask ventilation without difficulty Laryngoscope Size: Mac, McGrath and 4 Grade View: Grade I Tube type: Oral Tube size: 7.0 mm Number of attempts: 1 Airway Equipment and Method: Stylet Placement Confirmation: ETT inserted through vocal cords under direct vision, positive ETCO2 and breath sounds checked- equal and bilateral Secured at: 21 cm Tube secured with: Tape Dental Injury: Teeth and Oropharynx as per pre-operative assessment

## 2023-04-17 NOTE — Plan of Care (Signed)
Pt's first night on the unit.  Will assess pt and update plan of care as needed.   Problem: Education: Goal: Ability to describe self-care measures that may prevent or decrease complications (Diabetes Survival Skills Education) will improve Outcome: Progressing Goal: Individualized Educational Video(s) Outcome: Progressing   Problem: Coping: Goal: Ability to adjust to condition or change in health will improve Outcome: Progressing   Problem: Fluid Volume: Goal: Ability to maintain a balanced intake and output will improve Outcome: Progressing   Problem: Health Behavior/Discharge Planning: Goal: Ability to identify and utilize available resources and services will improve Outcome: Progressing Goal: Ability to manage health-related needs will improve Outcome: Progressing   Problem: Metabolic: Goal: Ability to maintain appropriate glucose levels will improve Outcome: Progressing   Problem: Nutritional: Goal: Maintenance of adequate nutrition will improve Outcome: Progressing Goal: Progress toward achieving an optimal weight will improve Outcome: Progressing   Problem: Skin Integrity: Goal: Risk for impaired skin integrity will decrease Outcome: Progressing   Problem: Tissue Perfusion: Goal: Adequacy of tissue perfusion will improve Outcome: Progressing   Problem: Education: Goal: Knowledge of General Education information will improve Description: Including pain rating scale, medication(s)/side effects and non-pharmacologic comfort measures Outcome: Progressing   Problem: Health Behavior/Discharge Planning: Goal: Ability to manage health-related needs will improve Outcome: Progressing   Problem: Clinical Measurements: Goal: Ability to maintain clinical measurements within normal limits will improve Outcome: Progressing Goal: Will remain free from infection Outcome: Progressing Goal: Diagnostic test results will improve Outcome: Progressing Goal: Respiratory  complications will improve Outcome: Progressing Goal: Cardiovascular complication will be avoided Outcome: Progressing   Problem: Activity: Goal: Risk for activity intolerance will decrease Outcome: Progressing   Problem: Nutrition: Goal: Adequate nutrition will be maintained Outcome: Progressing   Problem: Coping: Goal: Level of anxiety will decrease Outcome: Progressing   Problem: Elimination: Goal: Will not experience complications related to bowel motility Outcome: Progressing Goal: Will not experience complications related to urinary retention Outcome: Progressing   Problem: Pain Managment: Goal: General experience of comfort will improve and/or be controlled Outcome: Progressing   Problem: Safety: Goal: Ability to remain free from injury will improve Outcome: Progressing   Problem: Skin Integrity: Goal: Risk for impaired skin integrity will decrease Outcome: Progressing

## 2023-04-17 NOTE — Consult Note (Signed)
NAME: Summer Murphy  DOB: 09/08/1955  MRN: [id number]  Date/Time: 04/17/2023 12:35 PM  REQUESTING PROVIDER: Dr.Amin Subjective:  REASON FOR CONSULT: MSSA bacteremia ? Summer Murphy is a 68 y.o. with a history of cervical myelpathy for which she  underwent c3-c6 laminectomy and posterior fusion on 03/02/23, COPD, barrettts esophagus, hiatus hernia, HTN, followed by neurosurgery as OP and last seen by them on 04/12/23 with findings of overall wound healing well presented to the ED thru EMS from home on 04/16/23 after fall at home- as per the chart ( pt just got out of surgery ans she is still partially sedated) she was trying to reach for ehr walker and slipped and fell and could not get off the floor- 2 days prior to that she had slipped from her chair- she did not c/o fever or chills She had neck pain which was present since before surgery and not changed in intensity recently In the ED vitals  04/16/23 10:06  BP 128/62  Temp 101.4 F (38.6 C) !  Pulse Rate 61  Resp 20  SpO2 100 %   Labs  Latest Reference Range & Units 04/16/23 12:22  WBC 4.0 - 10.5 K/uL 9.1  Hemoglobin 12.0 - 15.0 g/dL 40.9 (L)  HCT 81.1 - 91.4 % 34.4 (L)  Platelets 150 - 400 K/uL 332  Creatinine 0.44 - 1.00 mg/dL 7.82  Blood culture sent She received a a dose of vancop, cefepime and metronidazole which was not continued  Xray of the chest, knee, hip were normal CT head and cervical spine essentially normal MRI of the cervical spine was done and it showed collection both deep at the laminectomy bed and superficial at the site of surgical incision site Blood culture came back positive for MSSA and she weas started on cefazolin Pt was seen by neurosurgeon on call and taken to the OR today and underwent irrigation and debridement.,  there was superficial infection but the deep surgical site was clear of infection. Cultures were taken I am seeing the patient for MSSA bacteremia     Fishing/hunting/animal bird  exposure Past Medical History:  Diagnosis Date   Acid reflux    Anemia    Anxiety    a.) buspirone BID + BZO PRN (clonazepam)   Arthritis    Asthma    Cervical disc disorder with radiculopathy of cervical region    Chronic pain syndrome    a.) on COT as prescribed by pain mangement   Chronic, continuous use of opioids    a.) followed by pain mangement   COPD (chronic obstructive pulmonary disease) (HCC)    DOE (dyspnea on exertion)    Hematuria    Hiatal hernia    History of kidney stones    Hypertension    Hypoglycemia    Insomnia    a.) uses Trazodone PRN   LBBB (left bundle branch block)    Migraine    Murmur    Osteopenia    Palpitations    Pre-diabetes    Restless leg    a.) on ropinirole   Stenosis of cervical spine with myelopathy (HCC)    T2DM (type 2 diabetes mellitus) (HCC)     Past Surgical History:  Procedure Laterality Date   ABDOMINAL HYSTERECTOMY     AMPUTATION TOE Left 07/31/2016   Procedure: AMPUTATION TOE/MPJ 2nd toe;  Surgeon: Linus Galas, DPM;  Location: [facility] ORS;  Service: Podiatry;  Laterality: Left;   APPENDECTOMY  1990   BACK SURGERY  low back   BREAST SURGERY     bilateral breast reduction   CARDIAC ELECTROPHYSIOLOGY STUDY AND ABLATION     CHOLECYSTECTOMY  1990   COLONOSCOPY WITH PROPOFOL N/A 01/04/2017   Procedure: COLONOSCOPY WITH PROPOFOL;  Surgeon: Scot Jun, MD;  Location: [facility] ENDOSCOPY;  Service: Endoscopy;  Laterality: N/A;   CORNEAL TRANSPLANT     ESOPHAGOGASTRODUODENOSCOPY N/A 04/09/2021   Procedure: ESOPHAGOGASTRODUODENOSCOPY (EGD);  Surgeon: Toney Reil, MD;  Location: [facility] ENDOSCOPY;  Service: Gastroenterology;  Laterality: N/A;   ESOPHAGOGASTRODUODENOSCOPY (EGD) WITH PROPOFOL N/A 01/04/2017   Procedure: ESOPHAGOGASTRODUODENOSCOPY (EGD) WITH PROPOFOL;  Surgeon: Scot Jun, MD;  Location: [facility] ENDOSCOPY;  Service: Endoscopy;  Laterality: N/A;   EXCISION BONE CYST Left 07/31/2016   Procedure: EXCISION  BONE CYST/exostectomy 28124/left 2nd;  Surgeon: Linus Galas, DPM;  Location: [facility] ORS;  Service: Podiatry;  Laterality: Left;   EXTRACORPOREAL SHOCK WAVE LITHOTRIPSY Left 09/12/2015   Procedure: EXTRACORPOREAL SHOCK WAVE LITHOTRIPSY (ESWL);  Surgeon: Vanna Scotland, MD;  Location: [facility] ORS;  Service: Urology;  Laterality: Left;   FRACTURE SURGERY     left foot   GUM SURGERY Left    gum infection   HARDWARE REMOVAL Left 11/14/2020   Procedure: HARDWARE REMOVAL;  Surgeon: Kennedy Bucker, MD;  Location: [facility] ORS;  Service: Orthopedics;  Laterality: Left;   HH repair     Fundoplication   JOINT REPLACEMENT Bilateral 2013,2014   total knees   LAPAROSCOPIC HYSTERECTOMY     LITHOTRIPSY     periprosthetic supracondylar fracture of left femur  02/16/2020   [facility]   POSTERIOR CERVICAL FUSION/FORAMINOTOMY N/A 03/02/2023   Procedure: C3-6 POSTERIOR CERVICAL LAMINECTOMY AND FUSION;  Surgeon: Lovenia Kim, MD;  Location: [facility] ORS;  Service: Neurosurgery;  Laterality: N/A;   TONSILLECTOMY     TOTAL KNEE REVISION Right 11/14/2019   Procedure: Revision patella and tibial polyethylene;  Surgeon: Kennedy Bucker, MD;  Location: [facility] ORS;  Service: Orthopedics;  Laterality: Right;   URETEROSCOPY      Social History   Socioeconomic History   Marital status: Widowed    Spouse name: Not on file   Number of children: Not on file   Years of education: Not on file   Highest education level: Not on file  Occupational History   Not on file  Tobacco Use   Smoking status: Never    Passive exposure: Past   Smokeless tobacco: Never  Vaping Use   Vaping status: Never Used  Substance and Sexual Activity   Alcohol use: No   Drug use: No   Sexual activity: Not Currently  Other Topics Concern   Not on file  Social History Narrative   Lives at home alone. Relatives are the support person.    Social Drivers of Corporate investment banker Strain: Not on file  Food Insecurity: No Food Insecurity  (04/16/2023)   Hunger Vital Sign    Worried About Running Out of Food in the Last Year: Never true    Ran Out of Food in the Last Year: Never true  Transportation Needs: No Transportation Needs (04/16/2023)   PRAPARE - Administrator, Civil Service (Medical): No    Lack of Transportation (Non-Medical): No  Physical Activity: Not on file  Stress: Not on file  Social Connections: Moderately Isolated (04/16/2023)   Social Connection and Isolation Panel [NHANES]    Frequency of Communication with Friends and Family: More than three times a week    Frequency of  Social Gatherings with Friends and Family: Once a week    Attends Religious Services: More than 4 times per year    Active Member of [employer] or Organizations: No    Attends Banker Meetings: Never    Marital Status: Widowed  Intimate Partner Violence: Not At Risk (04/16/2023)   Humiliation, Afraid, Rape, and Kick questionnaire    Fear of Current or Ex-Partner: No    Emotionally Abused: No    Physically Abused: No    Sexually Abused: No    Family History  Problem Relation Age of Onset   Hypertension Mother    Stroke Mother    Prostate cancer Father    Kidney Stones Father    Diabetes Brother    Hypertension Brother    Breast cancer Maternal Aunt    Kidney disease Neg Hx    Allergies  Allergen Reactions   Librium [Chlordiazepoxide] Shortness Of Breath   Reglan [Metoclopramide] Hives and Other (See Comments)    hallucinations    Vanilla Shortness Of Breath    Pt reports allergy to vanilla extract only   Aspirin Hives   Nsaids Rash    Rash/flares asthma issues.   Azithromycin Other (See Comments)    Unsure of what the reaction was.   Buprenorphine Hcl Other (See Comments)    Unsure of what the reaction was.    Flexeril [Cyclobenzaprine]     hallucinations   Morphine Other (See Comments)    Unsure of reaction   Sulfa Antibiotics     Other reaction(s): Unknown   Tolmetin     Other Reaction:  Allergy   Amlodipine Besylate Itching and Rash    arms, stomach and forehead   Iron Nausea And Vomiting   I? Current Facility-Administered Medications  Medication Dose Route Frequency Provider Last Rate Last Admin   0.9 %  sodium chloride infusion   Intravenous Continuous Arnetha Courser, MD   Stopped at 04/17/23 0535   acetaminophen (TYLENOL) tablet 650 mg  650 mg Oral Q6H PRN Arnetha Courser, MD       Or   acetaminophen (TYLENOL) suppository 650 mg  650 mg Rectal Q6H PRN Arnetha Courser, MD       busPIRone (BUSPAR) tablet 10 mg  10 mg Oral BID Arnetha Courser, MD   10 mg at 04/16/23 2149   ceFAZolin (ANCEF) IVPB 2g/100 mL premix  2 g Intravenous Q8H Manuela Schwartz, NP 200 mL/hr at 04/17/23 0535 2 g at 04/17/23 0535   celecoxib (CELEBREX) capsule 100 mg  100 mg Oral BID Arnetha Courser, MD   100 mg at 04/16/23 2149   clonazePAM (KLONOPIN) tablet 0.5-1 mg  0.5-1 mg Oral BID PRN Arnetha Courser, MD   1 mg at 04/16/23 2039   dextrose 5 % solution   Intravenous Continuous Arnetha Courser, MD       DULoxetine (CYMBALTA) DR capsule 60 mg  60 mg Oral Daily Arnetha Courser, MD       enoxaparin (LOVENOX) injection 40 mg  40 mg Subcutaneous Q24H Arnetha Courser, MD   40 mg at 04/16/23 2149   fluticasone furoate-vilanterol (BREO ELLIPTA) 200-25 MCG/ACT 1 puff  1 puff Inhalation Daily Arnetha Courser, MD   1 puff at 04/17/23 0901   gabapentin (NEURONTIN) capsule 900 mg  900 mg Oral BID Arnetha Courser, MD   900 mg at 04/16/23 2149   insulin aspart (novoLOG) injection 0-15 Units  0-15 Units Subcutaneous TID WC Arnetha Courser, MD   2 Units at 04/16/23  1920   insulin aspart (novoLOG) injection 0-5 Units  0-5 Units Subcutaneous QHS Amin, Tilman Neat, MD       ipratropium-albuterol (DUONEB) 0.5-2.5 (3) MG/3ML nebulizer solution 3 mL  3 mL Inhalation Q6H PRN Arnetha Courser, MD       IRRISEPT - 0.05% chlorhexedine in sterile water for irrigation    PRN Lucy Chris, MD   450 mL at 04/17/23 1211   lactated ringers bolus 1,000 mL  1,000  mL Intravenous Once Arnetha Courser, MD       montelukast (SINGULAIR) tablet 10 mg  10 mg Oral QHS Arnetha Courser, MD   10 mg at 04/16/23 2149   ondansetron (ZOFRAN) tablet 4 mg  4 mg Oral Q6H PRN Arnetha Courser, MD       Or   ondansetron (ZOFRAN) injection 4 mg  4 mg Intravenous Q6H PRN Arnetha Courser, MD       pantoprazole (PROTONIX) EC tablet 20 mg  20 mg Oral Daily Arnetha Courser, MD   20 mg at 04/16/23 1919   polyethylene glycol (MIRALAX / GLYCOLAX) packet 17 g  17 g Oral Daily PRN Arnetha Courser, MD       rOPINIRole (REQUIP) tablet 1 mg  1 mg Oral QHS Arnetha Courser, MD   1 mg at 04/16/23 2149   sodium chloride flush (NS) 0.9 % injection 3 mL  3 mL Intravenous Q12H Arnetha Courser, MD   3 mL at 04/17/23 0901   SUMAtriptan (IMITREX) tablet 100 mg  100 mg Oral Q2H PRN Arnetha Courser, MD       traZODone (DESYREL) tablet 100 mg  100 mg Oral QHS Arnetha Courser, MD   100 mg at 04/16/23 2149     Abtx:  Anti-infectives (From admission, onward)    Start     Dose/Rate Route Frequency Ordered Stop   04/17/23 0600  ceFAZolin (ANCEF) IVPB 2g/100 mL premix        2 g 200 mL/hr over 30 Minutes Intravenous Every 8 hours 04/17/23 0417     04/16/23 1445  ceFEPIme (MAXIPIME) 2 g in sodium chloride 0.9 % 100 mL IVPB        2 g 200 mL/hr over 30 Minutes Intravenous  Once 04/16/23 1434 04/16/23 1617   04/16/23 1445  metroNIDAZOLE (FLAGYL) IVPB 500 mg        500 mg 100 mL/hr over 60 Minutes Intravenous  Once 04/16/23 1434 04/16/23 1858   04/16/23 1445  vancomycin (VANCOCIN) IVPB 1000 mg/200 mL premix        1,000 mg 200 mL/hr over 60 Minutes Intravenous  Once 04/16/23 1434 04/16/23 1716       REVIEW OF SYSTEMS:  NA As she is recovering from anasthesia and sedation Objective:  VITALS:  BP (!) 121/57   Pulse (!) 114   Temp 98.8 F (37.1 C) (Oral)   Resp 18   Ht 5\' 3"  (1.6 m)   Wt 56.8 kg   SpO2 98%   BMI 22.18 kg/m  PHYSICAL EXAM:  General: somnolkent , eaisly arousal, follows commands -like moving  her lims- non verbal but nods Head: Normocephalic, without obvious abnormality, atraumatic. Eyes: Conjunctivae clear, anicteric sclerae. Pupils are equal ENT Nares normal. No drainage or sinus tenderness. Lips, mucosa, and tongue normal. No Thrush Neck:  symmetrical, no adenopathy, thyroid: non tender no carotid bruit and no JVD. Drain  Lungs: b/la ir entry Heart: Regular rate and rhythm, no murmur, rub or gallop. Abdomen: Soft, non-tender,not distended. Bowel sounds normal. No  masses Extremities: atraumatic, no cyanosis. No edema. No clubbing Skin: No rashes or lesions. Or bruising Lymph: Cervical, supraclavicular normal. Neurologic: Grossly non-focal Pertinent Labs Lab Results CBC    Component Value Date/Time   WBC 9.9 04/17/2023 0506   RBC 3.24 (L) 04/17/2023 0506   HGB 7.3 (L) 04/17/2023 0506   HGB 13.4 08/29/2021 1303   HCT 22.2 (L) 04/17/2023 0506   HCT 39.4 08/29/2021 1303   PLT 392 04/17/2023 0506   PLT 186 08/29/2021 1303   MCV 68.5 (L) 04/17/2023 0506   MCV 91 08/29/2021 1303   MCV 84 06/28/2014 1710   MCH 22.5 (L) 04/17/2023 0506   MCHC 32.9 04/17/2023 0506   RDW 22.9 (H) 04/17/2023 0506   RDW 12.6 08/29/2021 1303   RDW 16.7 (H) 06/28/2014 1710   LYMPHSABS 0.3 (L) 04/16/2023 1222   LYMPHSABS 2.0 08/29/2021 1303   LYMPHSABS 0.6 (L) 06/13/2013 0505   MONOABS 0.6 04/16/2023 1222   MONOABS 0.1 (L) 06/13/2013 0505   EOSABS 0.0 04/16/2023 1222   EOSABS 0.2 08/29/2021 1303   EOSABS 0.0 06/13/2013 0505   BASOSABS 0.0 04/16/2023 1222   BASOSABS 0.0 08/29/2021 1303   BASOSABS 0.0 06/13/2013 0505       Latest Ref Rng & Units 04/17/2023    3:38 AM 04/16/2023   12:22 PM 02/22/2023   11:03 AM  CMP  Glucose 70 - 99 mg/dL 952  841  324   BUN 8 - 23 mg/dL 16  30  13    Creatinine 0.44 - 1.00 mg/dL 4.01  0.27  2.53   Sodium 135 - 145 mmol/L 134  132  137   Potassium 3.5 - 5.1 mmol/L 3.4  3.5  3.8   Chloride 98 - 111 mmol/L 102  93  101   CO2 22 - 32 mmol/L 23  23  27     Calcium 8.9 - 10.3 mg/dL 7.8  8.4  8.8   Total Protein 6.5 - 8.1 g/dL  6.4    Total Bilirubin 0.0 - 1.2 mg/dL  0.6    Alkaline Phos 38 - 126 U/L  92    AST 15 - 41 U/L  16    ALT 0 - 44 U/L  14        Microbiology: Recent Results (from the past 240 hours)  Resp panel by RT-PCR (RSV, Flu A&B, Covid) Anterior Nasal Swab     Status: None   Collection Time: 04/16/23 11:04 AM   Specimen: Anterior Nasal Swab  Result Value Ref Range Status   SARS Coronavirus 2 by RT PCR NEGATIVE NEGATIVE Final    Comment: (NOTE) SARS-CoV-2 target nucleic acids are NOT DETECTED.  The SARS-CoV-2 RNA is generally detectable in upper respiratory specimens during the acute phase of infection. The lowest concentration of SARS-CoV-2 viral copies this assay can detect is 138 copies/mL. A negative result does not preclude SARS-Cov-2 infection and should not be used as the sole basis for treatment or other patient management decisions. A negative result may occur with  improper specimen collection/handling, submission of specimen other than nasopharyngeal swab, presence of viral mutation(s) within the areas targeted by this assay, and inadequate number of viral copies(<138 copies/mL). A negative result must be combined with clinical observations, patient history, and epidemiological information. The expected result is Negative.  Fact Sheet for Patients:  BloggerCourse.com  Fact Sheet for Healthcare Providers:  SeriousBroker.it  This test is no t yet approved or cleared by the [place] FDA and  has  been authorized for detection and/or diagnosis of SARS-CoV-2 by FDA under an Emergency Use Authorization (EUA). This EUA will remain  in effect (meaning this test can be used) for the duration of the COVID-19 declaration under Section 564(b)(1) of the Act, 21 U.S.C.section 360bbb-3(b)(1), unless the authorization is terminated  or revoked sooner.        Influenza A by PCR NEGATIVE NEGATIVE Final   Influenza B by PCR NEGATIVE NEGATIVE Final    Comment: (NOTE) The Xpert Xpress SARS-CoV-2/FLU/RSV plus assay is intended as an aid in the diagnosis of influenza from Nasopharyngeal swab specimens and should not be used as a sole basis for treatment. Nasal washings and aspirates are unacceptable for Xpert Xpress SARS-CoV-2/FLU/RSV testing.  Fact Sheet for Patients: BloggerCourse.com  Fact Sheet for Healthcare Providers: SeriousBroker.it  This test is not yet approved or cleared by the [place] FDA and has been authorized for detection and/or diagnosis of SARS-CoV-2 by FDA under an Emergency Use Authorization (EUA). This EUA will remain in effect (meaning this test can be used) for the duration of the COVID-19 declaration under Section 564(b)(1) of the Act, 21 U.S.C. section 360bbb-3(b)(1), unless the authorization is terminated or revoked.     Resp Syncytial Virus by PCR NEGATIVE NEGATIVE Final    Comment: (NOTE) Fact Sheet for Patients: BloggerCourse.com  Fact Sheet for Healthcare Providers: SeriousBroker.it  This test is not yet approved or cleared by the [place] FDA and has been authorized for detection and/or diagnosis of SARS-CoV-2 by FDA under an Emergency Use Authorization (EUA). This EUA will remain in effect (meaning this test can be used) for the duration of the COVID-19 declaration under Section 564(b)(1) of the Act, 21 U.S.C. section 360bbb-3(b)(1), unless the authorization is terminated or revoked.  Performed at [facility], [address] [place], [place] [address]   Blood culture (routine x 2)     Status: None (Preliminary result)   Collection Time: 04/16/23 12:22 PM   Specimen: BLOOD RIGHT ARM  Result Value Ref Range Status   Specimen Description BLOOD RIGHT ARM  Final   Special Requests    Final    BOTTLES DRAWN AEROBIC AND ANAEROBIC Blood Culture results may not be optimal due to an inadequate volume of blood received in culture bottles   Culture  Setup Time   Final    GRAM POSITIVE COCCI IN BOTH AEROBIC AND ANAEROBIC BOTTLES Organism ID to follow CRITICAL RESULT CALLED TO, READ BACK BY AND VERIFIED WITH: NATHAN BELUE @ 04/17/23 0115 BY AB Performed at [facility], [address] [place], [place] [address]    Culture GRAM POSITIVE COCCI  Final   Report Status PENDING  Incomplete  Blood culture (routine x 2)     Status: None (Preliminary result)   Collection Time: 04/16/23 12:22 PM   Specimen: BLOOD  Result Value Ref Range Status   Specimen Description BLOOD RIGHT ANTECUBITAL  Final   Special Requests   Final    BOTTLES DRAWN AEROBIC AND ANAEROBIC Blood Culture results may not be optimal due to an inadequate volume of blood received in culture bottles   Culture  Setup Time   Final    GRAM POSITIVE COCCI IN BOTH AEROBIC AND ANAEROBIC BOTTLES CRITICAL VALUE NOTED.  VALUE IS CONSISTENT WITH PREVIOUSLY REPORTED AND CALLED VALUE. Performed at [facility], [address] [place], [place] [address]    Culture [facility] POSITIVE COCCI  Final   Report Status PENDING  Incomplete  Blood Culture  ID Panel (Reflexed)     Status: Abnormal   Collection Time: 04/16/23 12:22 PM  Result Value Ref Range Status   Enterococcus faecalis NOT DETECTED NOT DETECTED Final   Enterococcus Faecium NOT DETECTED NOT DETECTED Final   Listeria monocytogenes NOT DETECTED NOT DETECTED Final   Staphylococcus species DETECTED (A) NOT DETECTED Final    Comment: CRITICAL RESULT CALLED TO, READ BACK BY AND VERIFIED WITH: NATHAN BELUE @ 04/17/23 0115 BY AB    Staphylococcus aureus (BCID) DETECTED (A) NOT DETECTED Final    Comment: CRITICAL RESULT CALLED TO, READ BACK BY AND VERIFIED WITH: NATHAN BELUE @ 04/17/23 0115 BY AB    Staphylococcus epidermidis NOT DETECTED NOT DETECTED Final    Staphylococcus lugdunensis NOT DETECTED NOT DETECTED Final   Streptococcus species NOT DETECTED NOT DETECTED Final   Streptococcus agalactiae NOT DETECTED NOT DETECTED Final   Streptococcus pneumoniae NOT DETECTED NOT DETECTED Final   Streptococcus pyogenes NOT DETECTED NOT DETECTED Final   A.calcoaceticus-baumannii NOT DETECTED NOT DETECTED Final   Bacteroides fragilis NOT DETECTED NOT DETECTED Final   Enterobacterales NOT DETECTED NOT DETECTED Final   Enterobacter cloacae complex NOT DETECTED NOT DETECTED Final   Escherichia coli NOT DETECTED NOT DETECTED Final   Klebsiella aerogenes NOT DETECTED NOT DETECTED Final   Klebsiella oxytoca NOT DETECTED NOT DETECTED Final   Klebsiella pneumoniae NOT DETECTED NOT DETECTED Final   Proteus species NOT DETECTED NOT DETECTED Final   Salmonella species NOT DETECTED NOT DETECTED Final   Serratia marcescens NOT DETECTED NOT DETECTED Final   Haemophilus influenzae NOT DETECTED NOT DETECTED Final   Neisseria meningitidis NOT DETECTED NOT DETECTED Final   Pseudomonas aeruginosa NOT DETECTED NOT DETECTED Final   Stenotrophomonas maltophilia NOT DETECTED NOT DETECTED Final   Candida albicans NOT DETECTED NOT DETECTED Final   Candida auris NOT DETECTED NOT DETECTED Final   Candida glabrata NOT DETECTED NOT DETECTED Final   Candida krusei NOT DETECTED NOT DETECTED Final   Candida parapsilosis NOT DETECTED NOT DETECTED Final   Candida tropicalis NOT DETECTED NOT DETECTED Final   Cryptococcus neoformans/gattii NOT DETECTED NOT DETECTED Final   Meth resistant mecA/C and MREJ NOT DETECTED NOT DETECTED Final    Comment: Performed at [facility], [address] [place], [place] [address]    IMAGING RESULTS: MRI cervical spine  I have personally reviewed the films C3-C6 posterior decompression and fusion . 4 cm fluid collection in the laminectomy bed at c3-c4 6 cm superficial fluid collection along the posterior incision  site   Impression/Recommendation ?MSSA bacteremia likely due to cervical spine surgical site infection Continue cefazolin 2 d echo Repeat blood culture after 48 hrs of appropriate antibiotic Will decide on TEE if spine culture negative   Recent c3-c6 laminectomy and posterior fusion MRI showed superficial and deep collection- underwent I/D and culture has been sent Continue cefazolin  Anemia  Hiatus hernia h/o fundoplication Dysphagia has had esophageal dilatation Nov 2024 Biopsy no evidence of metaplasia ? Anxiety  on meds Discussed the management with requesting provider and neurosurgeon

## 2023-04-17 NOTE — Progress Notes (Signed)
PHARMACY - PHYSICIAN COMMUNICATION CRITICAL VALUE ALERT - BLOOD CULTURE IDENTIFICATION (BCID)  Results for orders placed or performed during the hospital encounter of 04/16/23  Resp panel by RT-PCR (RSV, Flu A&B, Covid) Anterior Nasal Swab     Status: None   Collection Time: 04/16/23 11:04 AM   Specimen: Anterior Nasal Swab  Result Value Ref Range Status   SARS Coronavirus 2 by RT PCR NEGATIVE NEGATIVE Final    Comment: (NOTE) SARS-CoV-2 target nucleic acids are NOT DETECTED.  The SARS-CoV-2 RNA is generally detectable in upper respiratory specimens during the acute phase of infection. The lowest concentration of SARS-CoV-2 viral copies this assay can detect is 138 copies/mL. A negative result does not preclude SARS-Cov-2 infection and should not be used as the sole basis for treatment or other patient management decisions. A negative result may occur with  improper specimen collection/handling, submission of specimen other than nasopharyngeal swab, presence of viral mutation(s) within the areas targeted by this assay, and inadequate number of viral copies(<138 copies/mL). A negative result must be combined with clinical observations, patient history, and epidemiological information. The expected result is Negative.  Fact Sheet for Patients:  BloggerCourse.com  Fact Sheet for Healthcare Providers:  SeriousBroker.it  This test is no t yet approved or cleared by the [place] FDA and  has been authorized for detection and/or diagnosis of SARS-CoV-2 by FDA under an Emergency Use Authorization (EUA). This EUA will remain  in effect (meaning this test can be used) for the duration of the COVID-19 declaration under Section 564(b)(1) of the Act, 21 U.S.C.section 360bbb-3(b)(1), unless the authorization is terminated  or revoked sooner.       Influenza A by PCR NEGATIVE NEGATIVE Final   Influenza B by PCR NEGATIVE NEGATIVE Final     Comment: (NOTE) The Xpert Xpress SARS-CoV-2/FLU/RSV plus assay is intended as an aid in the diagnosis of influenza from Nasopharyngeal swab specimens and should not be used as a sole basis for treatment. Nasal washings and aspirates are unacceptable for Xpert Xpress SARS-CoV-2/FLU/RSV testing.  Fact Sheet for Patients: BloggerCourse.com  Fact Sheet for Healthcare Providers: SeriousBroker.it  This test is not yet approved or cleared by the [place] FDA and has been authorized for detection and/or diagnosis of SARS-CoV-2 by FDA under an Emergency Use Authorization (EUA). This EUA will remain in effect (meaning this test can be used) for the duration of the COVID-19 declaration under Section 564(b)(1) of the Act, 21 U.S.C. section 360bbb-3(b)(1), unless the authorization is terminated or revoked.     Resp Syncytial Virus by PCR NEGATIVE NEGATIVE Final    Comment: (NOTE) Fact Sheet for Patients: BloggerCourse.com  Fact Sheet for Healthcare Providers: SeriousBroker.it  This test is not yet approved or cleared by the [place] FDA and has been authorized for detection and/or diagnosis of SARS-CoV-2 by FDA under an Emergency Use Authorization (EUA). This EUA will remain in effect (meaning this test can be used) for the duration of the COVID-19 declaration under Section 564(b)(1) of the Act, 21 U.S.C. section 360bbb-3(b)(1), unless the authorization is terminated or revoked.  Performed at [facility], [address] [place], [place] [address]   Blood culture (routine x 2)     Status: None (Preliminary result)   Collection Time: 04/16/23 12:22 PM   Specimen: BLOOD RIGHT ARM  Result Value Ref Range Status   Specimen Description BLOOD RIGHT ARM  Final   Special Requests   Final    BOTTLES DRAWN AEROBIC AND ANAEROBIC  Blood Culture results may not be optimal due  to an inadequate volume of blood received in culture bottles   Culture  Setup Time   Final    GRAM POSITIVE COCCI IN BOTH AEROBIC AND ANAEROBIC BOTTLES Organism ID to follow CRITICAL RESULT CALLED TO, READ BACK BY AND VERIFIED WITH: Allure Greaser @ 04/17/23 0115 BY AB Performed at [facility], [address] [place], [place] [address]    Culture GRAM POSITIVE COCCI  Final   Report Status PENDING  Incomplete  Blood culture (routine x 2)     Status: None (Preliminary result)   Collection Time: 04/16/23 12:22 PM   Specimen: BLOOD  Result Value Ref Range Status   Specimen Description BLOOD RIGHT ANTECUBITAL  Final   Special Requests   Final    BOTTLES DRAWN AEROBIC AND ANAEROBIC Blood Culture results may not be optimal due to an inadequate volume of blood received in culture bottles   Culture  Setup Time   Final    GRAM POSITIVE COCCI IN BOTH AEROBIC AND ANAEROBIC BOTTLES CRITICAL VALUE NOTED.  VALUE IS CONSISTENT WITH PREVIOUSLY REPORTED AND CALLED VALUE. Performed at [facility], [address] [place], [place] [address]    Culture [facility] POSITIVE COCCI  Final   Report Status PENDING  Incomplete  Blood Culture ID Panel (Reflexed)     Status: Abnormal   Collection Time: 04/16/23 12:22 PM  Result Value Ref Range Status   Enterococcus faecalis NOT DETECTED NOT DETECTED Final   Enterococcus Faecium NOT DETECTED NOT DETECTED Final   Listeria monocytogenes NOT DETECTED NOT DETECTED Final   Staphylococcus species DETECTED (A) NOT DETECTED Final    Comment: CRITICAL RESULT CALLED TO, READ BACK BY AND VERIFIED WITH: Arjay Jaskiewicz @ 04/17/23 0115 BY AB    Staphylococcus aureus (BCID) DETECTED (A) NOT DETECTED Final    Comment: CRITICAL RESULT CALLED TO, READ BACK BY AND VERIFIED WITH: Rhiana Morash @ 04/17/23 0115 BY AB    Staphylococcus epidermidis NOT DETECTED NOT DETECTED Final   Staphylococcus lugdunensis NOT DETECTED NOT DETECTED Final   Streptococcus species NOT  DETECTED NOT DETECTED Final   Streptococcus agalactiae NOT DETECTED NOT DETECTED Final   Streptococcus pneumoniae NOT DETECTED NOT DETECTED Final   Streptococcus pyogenes NOT DETECTED NOT DETECTED Final   A.calcoaceticus-baumannii NOT DETECTED NOT DETECTED Final   Bacteroides fragilis NOT DETECTED NOT DETECTED Final   Enterobacterales NOT DETECTED NOT DETECTED Final   Enterobacter cloacae complex NOT DETECTED NOT DETECTED Final   Escherichia coli NOT DETECTED NOT DETECTED Final   Klebsiella aerogenes NOT DETECTED NOT DETECTED Final   Klebsiella oxytoca NOT DETECTED NOT DETECTED Final   Klebsiella pneumoniae NOT DETECTED NOT DETECTED Final   Proteus species NOT DETECTED NOT DETECTED Final   Salmonella species NOT DETECTED NOT DETECTED Final   Serratia marcescens NOT DETECTED NOT DETECTED Final   Haemophilus influenzae NOT DETECTED NOT DETECTED Final   Neisseria meningitidis NOT DETECTED NOT DETECTED Final   Pseudomonas aeruginosa NOT DETECTED NOT DETECTED Final   Stenotrophomonas maltophilia NOT DETECTED NOT DETECTED Final   Candida albicans NOT DETECTED NOT DETECTED Final   Candida auris NOT DETECTED NOT DETECTED Final   Candida glabrata NOT DETECTED NOT DETECTED Final   Candida krusei NOT DETECTED NOT DETECTED Final   Candida parapsilosis NOT DETECTED NOT DETECTED Final   Candida tropicalis NOT DETECTED NOT DETECTED Final   Cryptococcus neoformans/gattii NOT DETECTED NOT DETECTED Final   Meth resistant mecA/C and MREJ NOT DETECTED  NOT DETECTED Final    Comment: Performed at [facility], [address] [place], [place] [address]    BCID Results: 4 of 4 bottles with Staph Aureus, no resistance detected.  Pt not currently on any abx.  Name of provider contacted: Cliffton Asters, NP   Changes to prescribed antibiotics required: Start Cefazolin 2 gm.  Otelia Sergeant, PharmD, [facility] 04/17/2023 3:35 AM

## 2023-04-17 NOTE — Progress Notes (Signed)
Progress Note   Patient: Summer Murphy FAO:[id number] DOB: 1955-09-14 DOA: 04/16/2023     0 DOS: the patient was seen and examined on 04/17/2023   Brief hospital course: Summer Murphy is a 68 y.o. female with medical history significant of type 2 diabetes, asthma, arthritis s/p cervical fusion and anxiety came to ED after having a fall, she was trying to reach her walker which slipped and she fell and unable to get out of the floor easily.  Patient had another fall 2 days ago when she slipped from her chair.  She was complaining of pain in her knees and neck.   Patient with no recent illnesses, no upper respiratory symptoms, no headache, had 2 episodes of diarrhea 2 days ago which has been resolved.  No nausea or vomiting.  Appetite and p.o. intake at baseline.  No urinary symptoms No recent travel or concern of tick bite.  On presentation she was febrile at 101.1 and tachycardic, labs with no leukocytosis, hemoglobin 10.7 which is at her baseline, mild hyponatremia with sodium at 132, albumin 2.5, CK29, lactic acid 1, magnesium 1.5, procalcitonin 0.40. UA and urine cultures are pending, Blood cultures were drawn.   Multiple imaging which include CT head, CT cervical spine, DG knee, DG hip and pelvis and CT scan of abdomen and pelvis was negative for any acute abnormality.  On exam she has a small indurated spot at the base of her cervical fusion incision with point tenderness, MRI was ordered and it shows some fluid collection, abscess cannot be ruled out.  1/25: Afebrile this morning, blood cultures growing Staph aureus-pending susceptibility.  Hemoglobin decreased to 7.3 without any obvious bleeding. Type and screen was done and it shows positive antibody-ordered 1 unit of PRBC and also consulted neurosurgery as there is no other obvious source of infection.  Started on cefazolin as there is no resistance detected. Patient now meets the criteria for sepsis with fever and tachycardia,  Staph aureus bacteremia.  Echocardiogram also ordered. Neurosurgery is going to take her to the OR for washout.  Assessment and Plan: * Sepsis [facility]) Patient met sepsis criteria with fever and tachycardia, blood cultures growing Staph aureus-pending susceptibility. MRI of cervical spine with concern of a fluid collection along posterior incision for recent cervical fusion injury, infection or abscess cannot be excluded.  Neurosurgery was consulted. Patient is going to the OR for washout -Started on cefazolin per pharmacy -Echocardiogram -Consult ID -Follow neurosurgery recommendations  Falls Patient had 2 recent falls over the past 2 days, CK lower at 29. Both seems mechanical, door dizziness or presyncopal symptoms. Pan imaging without any bony abnormality or acute fractures. -PT and OT evaluation  Fever Now meeting criteria for sepsis.  T2DM (type 2 diabetes mellitus) (HCC) Patient uses Mounjaro at home. -SSI  Chronic pain syndrome History of arthritis. -Continue home Celebrex and Neurontin  Asthma No upper respiratory symptoms or concern of exacerbation. -Continue home inhalers  Restless leg -Continue home Requip  Anxiety and depression -Continue home BuSpar and Cymbalta  Migraine No acute concern. -Continue home Imitrex as needed  Acid reflux -Continue home PPI  Electrolyte abnormality Hyponatremia improving, sodium at 134, hypomagnesemia resolved.  Mild hypokalemia at 3.4 which is being repleted -Continue to monitor  Microcytic anemia Patient with history of iron deficiency anemia, hemoglobin decreased to 7.3 this morning, no obvious bleeding. Type and screen positive for antibodies -Checking iron and ferritin levels -1 unit of PRBC ordered -Starting on supplement -Monitor hemoglobin -Check FOBT  Subjective: Patient continued to have some neck pain when seen today.  Physical Exam: Vitals:   04/17/23 1430 04/17/23 1445 04/17/23 1500 04/17/23 1540   BP: (!) 148/67 (!) 159/70 (!) 148/74 (!) 168/72  Pulse: (!) 108 (!) 109 (!) 114 (!) 108  Resp: 11 (!) 9 (!) 24 18  Temp:   (!) 97.4 F (36.3 C) 99.7 F (37.6 C)  TempSrc:      SpO2: 100% 99% 98% 95%  Weight:      Height:       General.  Well-developed lady, in no acute distress. Pulmonary.  Lungs clear bilaterally, normal respiratory effort. CV.  Regular rate and rhythm, no JVD, rub or murmur. Abdomen.  Soft, nontender, nondistended, BS positive. CNS.  Alert and oriented .  No focal neurologic deficit. Extremities.  No edema, no cyanosis, pulses intact and symmetrical. Psychiatry.  Judgment and insight appears normal.   Data Reviewed: Prior data reviewed  Family Communication: Discussed with patient  Disposition: Status is: Inpatient Remains inpatient appropriate because: Severity of illness  Planned Discharge Destination: Home  DVT prophylaxis.  Lovenox Time spent: 50 minutes  This record has been created using Conservation officer, historic buildings. Errors have been sought and corrected,but may not always be located. Such creation errors do not reflect on the standard of care.   Author: Arnetha Courser, MD 04/17/2023 4:40 PM  For on call review [email or web address].

## 2023-04-17 NOTE — H&P (View-Only) (Signed)
Neurosurgery-New Consultation Evaluation 04/17/2023 [name] [id number]  Identifying Statement: Summer Murphy is a 68 y.o. female from [place] [address] with   Physician Requesting Consultation: Dr. Arnetha Courser  History of Present Illness: Ms. Meine is near now over 6 weeks out from her posterior cervical decompression and fusion.  She has had some difficulty in recovery but was seen in clinic recently with a well-healing wound.  She presented to the ED yesterday after a fall and was noted to be febrile with tachycardia and concern for infection.  Multiple imaging was done and laboratory results did show positive blood cultures with Staph aureus.  She has been started on broad-spectrum antibiotics.  MRI of the cervical spine was obtained today.  She is not endorsing any significant neck pain or other symptoms.  She does have some low back pain.  Past Medical History:  Past Medical History:  Diagnosis Date   Acid reflux    Anemia    Anxiety    a.) buspirone BID + BZO PRN (clonazepam)   Arthritis    Asthma    Cervical disc disorder with radiculopathy of cervical region    Chronic pain syndrome    a.) on COT as prescribed by pain mangement   Chronic, continuous use of opioids    a.) followed by pain mangement   COPD (chronic obstructive pulmonary disease) (HCC)    DOE (dyspnea on exertion)    Hematuria    Hiatal hernia    History of kidney stones    Hypertension    Hypoglycemia    Insomnia    a.) uses Trazodone PRN   LBBB (left bundle branch block)    Migraine    Murmur    Osteopenia    Palpitations    Pre-diabetes    Restless leg    a.) on ropinirole   Stenosis of cervical spine with myelopathy (HCC)    T2DM (type 2 diabetes mellitus) (HCC)     Social History: Social History   Socioeconomic History   Marital status: Widowed    Spouse name: Not on file   Number of children: Not on file   Years of education: Not on file   Highest education  level: Not on file  Occupational History   Not on file  Tobacco Use   Smoking status: Never    Passive exposure: Past   Smokeless tobacco: Never  Vaping Use   Vaping status: Never Used  Substance and Sexual Activity   Alcohol use: No   Drug use: No   Sexual activity: Not Currently  Other Topics Concern   Not on file  Social History Narrative   Lives at home alone. Relatives are the support person.    Social Drivers of Corporate investment banker Strain: Not on file  Food Insecurity: No Food Insecurity (04/16/2023)   Hunger Vital Sign    Worried About Running Out of Food in the Last Year: Never true    Ran Out of Food in the Last Year: Never true  Transportation Needs: No Transportation Needs (04/16/2023)   PRAPARE - Administrator, Civil Service (Medical): No    Lack of Transportation (Non-Medical): No  Physical Activity: Not on file  Stress: Not on file  Social Connections: Moderately Isolated (04/16/2023)   Social Connection and Isolation Panel [NHANES]    Frequency of Communication with Friends and Family: More than three times a week    Frequency of Social Gatherings with Friends and  Family: Once a week    Attends Religious Services: More than 4 times per year    Active Member of Clubs or Organizations: No    Attends Banker Meetings: Never    Marital Status: Widowed  Intimate Partner Violence: Not At Risk (04/16/2023)   Humiliation, Afraid, Rape, and Kick questionnaire    Fear of Current or Ex-Partner: No    Emotionally Abused: No    Physically Abused: No    Sexually Abused: No    Family History: Family History  Problem Relation Age of Onset   Hypertension Mother    Stroke Mother    Prostate cancer Father    Kidney Stones Father    Diabetes Brother    Hypertension Brother    Breast cancer Maternal Aunt    Kidney disease Neg Hx     Review of Systems:  Review of Systems - General ROS: Negative Psychological ROS:  Negative Ophthalmic ROS: Negative ENT ROS: Negative Hematological and Lymphatic ROS: Negative  Endocrine ROS: Negative Respiratory ROS: Negative Cardiovascular ROS: Negative Gastrointestinal ROS: Negative Genito-Urinary ROS: Negative Musculoskeletal ROS: Positive for low back pain Neurological ROS: Negative Dermatological ROS: Negative  Physical Exam: BP (!) 120/58 (BP Location: Right Arm)   Pulse (!) 105   Temp 98.1 F (36.7 C)   Resp 15   Ht 5\' 3"  (1.6 m)   Wt 56.8 kg   SpO2 98%   BMI 22.18 kg/m  Body mass index is 22.18 kg/m. Body surface area is 1.59 meters squared. General appearance: Alert, cooperative, in no acute distress Head: Normocephalic, atraumatic Eyes: Normal, EOM intact Oropharynx: Moist without lesions Neck: Incision appears well-approximated with some mild areas of erythema but no significant swelling. Ext: No edema in LE bilaterally  Neurologic exam:  Mental status: alertness: alert,  time, affect: normal Speech: fluent and clear Motor:strength is 5 out of 5 in bilateral upper extremities proximally with more for out of 5 distally Sensory: intact to light touch  Gait: Not tested  Laboratory: Results for orders placed or performed during the hospital encounter of 04/16/23  Resp panel by RT-PCR (RSV, Flu A&B, Covid) Anterior Nasal Swab   Collection Time: 04/16/23 11:04 AM   Specimen: Anterior Nasal Swab  Result Value Ref Range   SARS Coronavirus 2 by RT PCR NEGATIVE NEGATIVE   Influenza A by PCR NEGATIVE NEGATIVE   Influenza B by PCR NEGATIVE NEGATIVE   Resp Syncytial Virus by PCR NEGATIVE NEGATIVE  Blood culture (routine x 2)   Collection Time: 04/16/23 12:22 PM   Specimen: BLOOD RIGHT ARM  Result Value Ref Range   Specimen Description BLOOD RIGHT ARM    Special Requests      BOTTLES DRAWN AEROBIC AND ANAEROBIC Blood Culture results may not be optimal due to an inadequate volume of blood received in culture bottles   Culture  Setup Time       GRAM POSITIVE COCCI IN BOTH AEROBIC AND ANAEROBIC BOTTLES Organism ID to follow CRITICAL RESULT CALLED TO, READ BACK BY AND VERIFIED WITH: NATHAN BELUE @ 04/17/23 0115 BY AB Performed at [facility], [address] [place], [place] [address]    Culture GRAM POSITIVE COCCI    Report Status PENDING   Blood culture (routine x 2)   Collection Time: 04/16/23 12:22 PM   Specimen: BLOOD  Result Value Ref Range   Specimen Description BLOOD RIGHT ANTECUBITAL    Special Requests      BOTTLES DRAWN AEROBIC AND ANAEROBIC Blood  Culture results may not be optimal due to an inadequate volume of blood received in culture bottles   Culture  Setup Time      GRAM POSITIVE COCCI IN BOTH AEROBIC AND ANAEROBIC BOTTLES CRITICAL VALUE NOTED.  VALUE IS CONSISTENT WITH PREVIOUSLY REPORTED AND CALLED VALUE. Performed at [facility], [address] [place], [place] [address]    Culture GRAM POSITIVE COCCI    Report Status PENDING   Blood Culture ID Panel (Reflexed)   Collection Time: 04/16/23 12:22 PM  Result Value Ref Range   Enterococcus faecalis NOT DETECTED NOT DETECTED   Enterococcus Faecium NOT DETECTED NOT DETECTED   Listeria monocytogenes NOT DETECTED NOT DETECTED   Staphylococcus species DETECTED (A) NOT DETECTED   Staphylococcus aureus (BCID) DETECTED (A) NOT DETECTED   Staphylococcus epidermidis NOT DETECTED NOT DETECTED   Staphylococcus lugdunensis NOT DETECTED NOT DETECTED   Streptococcus species NOT DETECTED NOT DETECTED   Streptococcus agalactiae NOT DETECTED NOT DETECTED   Streptococcus pneumoniae NOT DETECTED NOT DETECTED   Streptococcus pyogenes NOT DETECTED NOT DETECTED   A.calcoaceticus-baumannii NOT DETECTED NOT DETECTED   Bacteroides fragilis NOT DETECTED NOT DETECTED   Enterobacterales NOT DETECTED NOT DETECTED   Enterobacter cloacae complex NOT DETECTED NOT DETECTED   Escherichia coli NOT DETECTED NOT DETECTED   Klebsiella aerogenes NOT DETECTED NOT  DETECTED   Klebsiella oxytoca NOT DETECTED NOT DETECTED   Klebsiella pneumoniae NOT DETECTED NOT DETECTED   Proteus species NOT DETECTED NOT DETECTED   Salmonella species NOT DETECTED NOT DETECTED   Serratia marcescens NOT DETECTED NOT DETECTED   Haemophilus influenzae NOT DETECTED NOT DETECTED   Neisseria meningitidis NOT DETECTED NOT DETECTED   Pseudomonas aeruginosa NOT DETECTED NOT DETECTED   Stenotrophomonas maltophilia NOT DETECTED NOT DETECTED   Candida albicans NOT DETECTED NOT DETECTED   Candida auris NOT DETECTED NOT DETECTED   Candida glabrata NOT DETECTED NOT DETECTED   Candida krusei NOT DETECTED NOT DETECTED   Candida parapsilosis NOT DETECTED NOT DETECTED   Candida tropicalis NOT DETECTED NOT DETECTED   Cryptococcus neoformans/gattii NOT DETECTED NOT DETECTED   Meth resistant mecA/C and MREJ NOT DETECTED NOT DETECTED  CBC with Differential   Collection Time: 04/16/23 12:22 PM  Result Value Ref Range   WBC 9.1 4.0 - 10.5 K/uL   RBC 4.94 3.87 - 5.11 MIL/uL   Hemoglobin 10.7 (L) 12.0 - 15.0 g/dL   HCT 13.0 (L) 86.5 - 78.4 %   MCV 69.6 (L) 80.0 - 100.0 fL   MCH 21.7 (L) 26.0 - 34.0 pg   MCHC 31.1 30.0 - 36.0 g/dL   RDW 69.6 (H) 29.5 - 28.4 %   Platelets 332 150 - 400 K/uL   nRBC 0.0 0.0 - 0.2 %   Neutrophils Relative % 87 %   Neutro Abs 7.9 (H) 1.7 - 7.7 K/uL   Lymphocytes Relative 4 %   Lymphs Abs 0.3 (L) 0.7 - 4.0 K/uL   Monocytes Relative 7 %   Monocytes Absolute 0.6 0.1 - 1.0 K/uL   Eosinophils Relative 0 %   Eosinophils Absolute 0.0 0.0 - 0.5 K/uL   Basophils Relative 0 %   Basophils Absolute 0.0 0.0 - 0.1 K/uL   WBC Morphology MORPHOLOGY UNREMARKABLE    RBC Morphology MIXED RBC POPULATION    Smear Review Normal platelet morphology    Immature Granulocytes 2 %   Abs Immature Granulocytes 0.15 (H) 0.00 - 0.07 K/uL  Comprehensive metabolic panel   Collection Time: 04/16/23 12:22 PM  Result Value Ref Range   Sodium 132 (L) 135 - 145 mmol/L   Potassium  3.5 3.5 - 5.1 mmol/L   Chloride 93 (L) 98 - 111 mmol/L   CO2 23 22 - 32 mmol/L   Glucose, Bld 113 (H) 70 - 99 mg/dL   BUN 30 (H) 8 - 23 mg/dL   Creatinine, Ser 3.32 0.44 - 1.00 mg/dL   Calcium 8.4 (L) 8.9 - 10.3 mg/dL   Total Protein 6.4 (L) 6.5 - 8.1 g/dL   Albumin 2.5 (L) 3.5 - 5.0 g/dL   AST 16 15 - 41 U/L   ALT 14 0 - 44 U/L   Alkaline Phosphatase 92 38 - 126 U/L   Total Bilirubin 0.6 0.0 - 1.2 mg/dL   GFR, Estimated >95 >18 mL/min   Anion gap 16 (H) 5 - 15  CK   Collection Time: 04/16/23 12:22 PM  Result Value Ref Range   Total CK 29 (L) 38 - 234 U/L  Lactic acid, plasma   Collection Time: 04/16/23 12:22 PM  Result Value Ref Range   Lactic Acid, Venous 1.0 0.5 - 1.9 mmol/L  Procalcitonin   Collection Time: 04/16/23 12:22 PM  Result Value Ref Range   Procalcitonin 0.40 ng/mL  Magnesium   Collection Time: 04/16/23 12:22 PM  Result Value Ref Range   Magnesium 1.5 (L) 1.7 - 2.4 mg/dL  Hemoglobin A4Z   Collection Time: 04/16/23 12:22 PM  Result Value Ref Range   Hgb A1c MFr Bld 5.8 (H) 4.8 - 5.6 %   Mean Plasma Glucose 119.76 mg/dL  Troponin I (High Sensitivity)   Collection Time: 04/16/23 12:22 PM  Result Value Ref Range   Troponin I (High Sensitivity) 22 (H) <18 ng/L  Urinalysis, Routine w reflex microscopic -Urine, Clean Catch   Collection Time: 04/16/23  2:31 PM  Result Value Ref Range   Color, Urine YELLOW (A) YELLOW   APPearance HAZY (A) CLEAR   Specific Gravity, Urine 1.009 1.005 - 1.030   pH 5.0 5.0 - 8.0   Glucose, UA NEGATIVE NEGATIVE mg/dL   Hgb urine dipstick LARGE (A) NEGATIVE   Bilirubin Urine NEGATIVE NEGATIVE   Ketones, ur 5 (A) NEGATIVE mg/dL   Protein, ur NEGATIVE NEGATIVE mg/dL   Nitrite NEGATIVE NEGATIVE   Leukocytes,Ua NEGATIVE NEGATIVE   RBC / HPF 11-20 0 - 5 RBC/hpf   WBC, UA 0-5 0 - 5 WBC/hpf   Bacteria, UA RARE (A) NONE SEEN   Squamous Epithelial / HPF 0-5 0 - 5 /HPF   Amorphous Crystal PRESENT   Lactic acid, plasma   Collection  Time: 04/16/23  2:59 PM  Result Value Ref Range   Lactic Acid, Venous 0.8 0.5 - 1.9 mmol/L  Troponin I (High Sensitivity)   Collection Time: 04/16/23  2:59 PM  Result Value Ref Range   Troponin I (High Sensitivity) 10 <18 ng/L  CBG monitoring, ED   Collection Time: 04/16/23  7:12 PM  Result Value Ref Range   Glucose-Capillary 136 (H) 70 - 99 mg/dL  Glucose, capillary   Collection Time: 04/16/23  8:45 PM  Result Value Ref Range   Glucose-Capillary 108 (H) 70 - 99 mg/dL  Basic metabolic panel   Collection Time: 04/17/23  3:38 AM  Result Value Ref Range   Sodium 134 (L) 135 - 145 mmol/L   Potassium 3.4 (L) 3.5 - 5.1 mmol/L   Chloride 102 98 - 111 mmol/L   CO2 23 22 - 32 mmol/L   Glucose, Bld  103 (H) 70 - 99 mg/dL   BUN 16 8 - 23 mg/dL   Creatinine, Ser 1.61 0.44 - 1.00 mg/dL   Calcium 7.8 (L) 8.9 - 10.3 mg/dL   GFR, Estimated >09 >60 mL/min   Anion gap 9 5 - 15  CBC   Collection Time: 04/17/23  3:38 AM  Result Value Ref Range   WBC 10.1 4.0 - 10.5 K/uL   RBC 3.35 (L) 3.87 - 5.11 MIL/uL   Hemoglobin 7.5 (L) 12.0 - 15.0 g/dL   HCT 45.4 (L) 09.8 - 11.9 %   MCV 70.7 (L) 80.0 - 100.0 fL   MCH 22.4 (L) 26.0 - 34.0 pg   MCHC 31.6 30.0 - 36.0 g/dL   RDW 14.7 (H) 82.9 - 56.2 %   Platelets 405 (H) 150 - 400 K/uL   nRBC 0.0 0.0 - 0.2 %  CBC   Collection Time: 04/17/23  5:06 AM  Result Value Ref Range   WBC 9.9 4.0 - 10.5 K/uL   RBC 3.24 (L) 3.87 - 5.11 MIL/uL   Hemoglobin 7.3 (L) 12.0 - 15.0 g/dL   HCT 13.0 (L) 86.5 - 78.4 %   MCV 68.5 (L) 80.0 - 100.0 fL   MCH 22.5 (L) 26.0 - 34.0 pg   MCHC 32.9 30.0 - 36.0 g/dL   RDW 69.6 (H) 29.5 - 28.4 %   Platelets 392 150 - 400 K/uL   nRBC 0.0 0.0 - 0.2 %  CK   Collection Time: 04/17/23  6:35 AM  Result Value Ref Range   Total CK 15 (L) 38 - 234 U/L  Magnesium   Collection Time: 04/17/23  6:35 AM  Result Value Ref Range   Magnesium 2.3 1.7 - 2.4 mg/dL  Type and screen [facility]   Collection Time: 04/17/23   6:35 AM  Result Value Ref Range   ABO/RH(D) A POS    Antibody Screen POS    Sample Expiration 04/20/2023,2359    Antibody Identification ANTI FYA (Duffy a)    Unit Number [id number]    Blood Component Type RBC LR PHER2    Unit division 00    Status of Unit ALLOCATED    Transfusion Status OK TO TRANSFUSE    Crossmatch Result      COMPATIBLE Performed at [facility], [address] [place], [place] [address]    Unit Number [id number]    Blood Component Type RBC, LR IRR    Unit division 00    Status of Unit ALLOCATED    Transfusion Status OK TO TRANSFUSE    Crossmatch Result COMPATIBLE    Unit Number [id number]    Blood Component Type RED CELLS,LR    Unit division 00    Status of Unit ALLOCATED    Transfusion Status OK TO TRANSFUSE    Crossmatch Result COMPATIBLE   BPAM RBC   Collection Time: 04/17/23  6:35 AM  Result Value Ref Range   Blood Product Unit Number [id number]    PRODUCT CODE T5573U20    Unit Type and Rh 5100    Blood Product Expiration Date 254270623762    Blood Product Unit Number [id number]    PRODUCT CODE T0626R48    Unit Type and Rh 5100    Blood Product Expiration Date 546270350093    Blood Product Unit Number [id number]    PRODUCT CODE E0382V00    Unit Type and Rh 5100    Blood Product Expiration Date 893810175102   Glucose, capillary  Collection Time: 04/17/23  8:08 AM  Result Value Ref Range   Glucose-Capillary 89 70 - 99 mg/dL  Prepare RBC (crossmatch)   Collection Time: 04/17/23  8:29 AM  Result Value Ref Range   Order Confirmation PENDING    I personally reviewed labs  Imaging: MRI cervical spine:2.3 x 3.8 x 2.9 cm (AP x transverse x craniocaudal) fluid collection in the laminectomy bed at C3-4 with moderately thick rim enhancement. More superficial 2.1 x 4.1 x 6.0 cm fluid collection within the subcutaneous tissues along the posterior cervical incision extending from C3-C7, also with peripheral  enhancement. Enhancement in the surrounding soft tissues.   Impression/Plan:  Ms. Ast is here after her prior posterior cervical decompression and fusion.  She show concern for likely infection and she is found to have bacteremia.  Given the prior surgery, MRI was performed which showed fluid collections at the site of surgery concerning for infection.  Given the presence of hardware, I do think it is prudent to proceed to the OR for irrigation and debridement.   1.  Diagnosis: Possible wound infection  2.  Plan -OR today for washout  Lucy Chris, MD Neurosurgery

## 2023-04-17 NOTE — Progress Notes (Signed)
OT Cancellation Note  Patient Details Name: Summer Murphy MRN: [id number] DOB: 1955/12/29   Cancelled Treatment:    Reason Eval/Treat Not Completed: Patient not medically ready - OT order receive and chart review done - pt is being taken back to OR today for I&D -recommended by neurosurgery.  Oletta Cohn OTR/L,CLT 04/17/2023, 11:27 AM

## 2023-04-17 NOTE — Consult Note (Signed)
Neurosurgery-New Consultation Evaluation 04/17/2023 [name] [id number]  Identifying Statement: Summer Murphy is a 68 y.o. female from [place] [address] with   Physician Requesting Consultation: Dr. Arnetha Courser  History of Present Illness: Summer Murphy is near now over 6 weeks out from her posterior cervical decompression and fusion.  She has had some difficulty in recovery but was seen in clinic recently with a well-healing wound.  She presented to the ED yesterday after a fall and was noted to be febrile with tachycardia and concern for infection.  Multiple imaging was done and laboratory results did show positive blood cultures with Staph aureus.  She has been started on broad-spectrum antibiotics.  MRI of the cervical spine was obtained today.  She is not endorsing any significant neck pain or other symptoms.  She does have some low back pain.  Past Medical History:  Past Medical History:  Diagnosis Date   Acid reflux    Anemia    Anxiety    a.) buspirone BID + BZO PRN (clonazepam)   Arthritis    Asthma    Cervical disc disorder with radiculopathy of cervical region    Chronic pain syndrome    a.) on COT as prescribed by pain mangement   Chronic, continuous use of opioids    a.) followed by pain mangement   COPD (chronic obstructive pulmonary disease) (HCC)    DOE (dyspnea on exertion)    Hematuria    Hiatal hernia    History of kidney stones    Hypertension    Hypoglycemia    Insomnia    a.) uses Trazodone PRN   LBBB (left bundle branch block)    Migraine    Murmur    Osteopenia    Palpitations    Pre-diabetes    Restless leg    a.) on ropinirole   Stenosis of cervical spine with myelopathy (HCC)    T2DM (type 2 diabetes mellitus) (HCC)     Social History: Social History   Socioeconomic History   Marital status: Widowed    Spouse name: Not on file   Number of children: Not on file   Years of education: Not on file   Highest education  level: Not on file  Occupational History   Not on file  Tobacco Use   Smoking status: Never    Passive exposure: Past   Smokeless tobacco: Never  Vaping Use   Vaping status: Never Used  Substance and Sexual Activity   Alcohol use: No   Drug use: No   Sexual activity: Not Currently  Other Topics Concern   Not on file  Social History Narrative   Lives at home alone. Relatives are the support person.    Social Drivers of Corporate investment banker Strain: Not on file  Food Insecurity: No Food Insecurity (04/16/2023)   Hunger Vital Sign    Worried About Running Out of Food in the Last Year: Never true    Ran Out of Food in the Last Year: Never true  Transportation Needs: No Transportation Needs (04/16/2023)   PRAPARE - Administrator, Civil Service (Medical): No    Lack of Transportation (Non-Medical): No  Physical Activity: Not on file  Stress: Not on file  Social Connections: Moderately Isolated (04/16/2023)   Social Connection and Isolation Panel [NHANES]    Frequency of Communication with Friends and Family: More than three times a week    Frequency of Social Gatherings with Friends and  Family: Once a week    Attends Religious Services: More than 4 times per year    Active Member of Clubs or Organizations: No    Attends Banker Meetings: Never    Marital Status: Widowed  Intimate Partner Violence: Not At Risk (04/16/2023)   Humiliation, Afraid, Rape, and Kick questionnaire    Fear of Current or Ex-Partner: No    Emotionally Abused: No    Physically Abused: No    Sexually Abused: No    Family History: Family History  Problem Relation Age of Onset   Hypertension Mother    Stroke Mother    Prostate cancer Father    Kidney Stones Father    Diabetes Brother    Hypertension Brother    Breast cancer Maternal Aunt    Kidney disease Neg Hx     Review of Systems:  Review of Systems - General ROS: Negative Psychological ROS:  Negative Ophthalmic ROS: Negative ENT ROS: Negative Hematological and Lymphatic ROS: Negative  Endocrine ROS: Negative Respiratory ROS: Negative Cardiovascular ROS: Negative Gastrointestinal ROS: Negative Genito-Urinary ROS: Negative Musculoskeletal ROS: Positive for low back pain Neurological ROS: Negative Dermatological ROS: Negative  Physical Exam: BP (!) 120/58 (BP Location: Right Arm)   Pulse (!) 105   Temp 98.1 F (36.7 C)   Resp 15   Ht 5\' 3"  (1.6 m)   Wt 56.8 kg   SpO2 98%   BMI 22.18 kg/m  Body mass index is 22.18 kg/m. Body surface area is 1.59 meters squared. General appearance: Alert, cooperative, in no acute distress Head: Normocephalic, atraumatic Eyes: Normal, EOM intact Oropharynx: Moist without lesions Neck: Incision appears well-approximated with some mild areas of erythema but no significant swelling. Ext: No edema in LE bilaterally  Neurologic exam:  Mental status: alertness: alert,  time, affect: normal Speech: fluent and clear Motor:strength is 5 out of 5 in bilateral upper extremities proximally with more for out of 5 distally Sensory: intact to light touch  Gait: Not tested  Laboratory: Results for orders placed or performed during the hospital encounter of 04/16/23  Resp panel by RT-PCR (RSV, Flu A&B, Covid) Anterior Nasal Swab   Collection Time: 04/16/23 11:04 AM   Specimen: Anterior Nasal Swab  Result Value Ref Range   SARS Coronavirus 2 by RT PCR NEGATIVE NEGATIVE   Influenza A by PCR NEGATIVE NEGATIVE   Influenza B by PCR NEGATIVE NEGATIVE   Resp Syncytial Virus by PCR NEGATIVE NEGATIVE  Blood culture (routine x 2)   Collection Time: 04/16/23 12:22 PM   Specimen: BLOOD RIGHT ARM  Result Value Ref Range   Specimen Description BLOOD RIGHT ARM    Special Requests      BOTTLES DRAWN AEROBIC AND ANAEROBIC Blood Culture results may not be optimal due to an inadequate volume of blood received in culture bottles   Culture  Setup Time       GRAM POSITIVE COCCI IN BOTH AEROBIC AND ANAEROBIC BOTTLES Organism ID to follow CRITICAL RESULT CALLED TO, READ BACK BY AND VERIFIED WITH: NATHAN BELUE @ 04/17/23 0115 BY AB Performed at [facility], [address] [place], [place] [address]    Culture GRAM POSITIVE COCCI    Report Status PENDING   Blood culture (routine x 2)   Collection Time: 04/16/23 12:22 PM   Specimen: BLOOD  Result Value Ref Range   Specimen Description BLOOD RIGHT ANTECUBITAL    Special Requests      BOTTLES DRAWN AEROBIC AND ANAEROBIC Blood  Culture results may not be optimal due to an inadequate volume of blood received in culture bottles   Culture  Setup Time      GRAM POSITIVE COCCI IN BOTH AEROBIC AND ANAEROBIC BOTTLES CRITICAL VALUE NOTED.  VALUE IS CONSISTENT WITH PREVIOUSLY REPORTED AND CALLED VALUE. Performed at [facility], [address] [place], [place] [address]    Culture GRAM POSITIVE COCCI    Report Status PENDING   Blood Culture ID Panel (Reflexed)   Collection Time: 04/16/23 12:22 PM  Result Value Ref Range   Enterococcus faecalis NOT DETECTED NOT DETECTED   Enterococcus Faecium NOT DETECTED NOT DETECTED   Listeria monocytogenes NOT DETECTED NOT DETECTED   Staphylococcus species DETECTED (A) NOT DETECTED   Staphylococcus aureus (BCID) DETECTED (A) NOT DETECTED   Staphylococcus epidermidis NOT DETECTED NOT DETECTED   Staphylococcus lugdunensis NOT DETECTED NOT DETECTED   Streptococcus species NOT DETECTED NOT DETECTED   Streptococcus agalactiae NOT DETECTED NOT DETECTED   Streptococcus pneumoniae NOT DETECTED NOT DETECTED   Streptococcus pyogenes NOT DETECTED NOT DETECTED   A.calcoaceticus-baumannii NOT DETECTED NOT DETECTED   Bacteroides fragilis NOT DETECTED NOT DETECTED   Enterobacterales NOT DETECTED NOT DETECTED   Enterobacter cloacae complex NOT DETECTED NOT DETECTED   Escherichia coli NOT DETECTED NOT DETECTED   Klebsiella aerogenes NOT DETECTED NOT  DETECTED   Klebsiella oxytoca NOT DETECTED NOT DETECTED   Klebsiella pneumoniae NOT DETECTED NOT DETECTED   Proteus species NOT DETECTED NOT DETECTED   Salmonella species NOT DETECTED NOT DETECTED   Serratia marcescens NOT DETECTED NOT DETECTED   Haemophilus influenzae NOT DETECTED NOT DETECTED   Neisseria meningitidis NOT DETECTED NOT DETECTED   Pseudomonas aeruginosa NOT DETECTED NOT DETECTED   Stenotrophomonas maltophilia NOT DETECTED NOT DETECTED   Candida albicans NOT DETECTED NOT DETECTED   Candida auris NOT DETECTED NOT DETECTED   Candida glabrata NOT DETECTED NOT DETECTED   Candida krusei NOT DETECTED NOT DETECTED   Candida parapsilosis NOT DETECTED NOT DETECTED   Candida tropicalis NOT DETECTED NOT DETECTED   Cryptococcus neoformans/gattii NOT DETECTED NOT DETECTED   Meth resistant mecA/C and MREJ NOT DETECTED NOT DETECTED  CBC with Differential   Collection Time: 04/16/23 12:22 PM  Result Value Ref Range   WBC 9.1 4.0 - 10.5 K/uL   RBC 4.94 3.87 - 5.11 MIL/uL   Hemoglobin 10.7 (L) 12.0 - 15.0 g/dL   HCT 41.3 (L) 24.4 - 01.0 %   MCV 69.6 (L) 80.0 - 100.0 fL   MCH 21.7 (L) 26.0 - 34.0 pg   MCHC 31.1 30.0 - 36.0 g/dL   RDW 27.2 (H) 53.6 - 64.4 %   Platelets 332 150 - 400 K/uL   nRBC 0.0 0.0 - 0.2 %   Neutrophils Relative % 87 %   Neutro Abs 7.9 (H) 1.7 - 7.7 K/uL   Lymphocytes Relative 4 %   Lymphs Abs 0.3 (L) 0.7 - 4.0 K/uL   Monocytes Relative 7 %   Monocytes Absolute 0.6 0.1 - 1.0 K/uL   Eosinophils Relative 0 %   Eosinophils Absolute 0.0 0.0 - 0.5 K/uL   Basophils Relative 0 %   Basophils Absolute 0.0 0.0 - 0.1 K/uL   WBC Morphology MORPHOLOGY UNREMARKABLE    RBC Morphology MIXED RBC POPULATION    Smear Review Normal platelet morphology    Immature Granulocytes 2 %   Abs Immature Granulocytes 0.15 (H) 0.00 - 0.07 K/uL  Comprehensive metabolic panel   Collection Time: 04/16/23 12:22 PM  Result Value Ref Range   Sodium 132 (L) 135 - 145 mmol/L   Potassium  3.5 3.5 - 5.1 mmol/L   Chloride 93 (L) 98 - 111 mmol/L   CO2 23 22 - 32 mmol/L   Glucose, Bld 113 (H) 70 - 99 mg/dL   BUN 30 (H) 8 - 23 mg/dL   Creatinine, Ser 3.32 0.44 - 1.00 mg/dL   Calcium 8.4 (L) 8.9 - 10.3 mg/dL   Total Protein 6.4 (L) 6.5 - 8.1 g/dL   Albumin 2.5 (L) 3.5 - 5.0 g/dL   AST 16 15 - 41 U/L   ALT 14 0 - 44 U/L   Alkaline Phosphatase 92 38 - 126 U/L   Total Bilirubin 0.6 0.0 - 1.2 mg/dL   GFR, Estimated >95 >18 mL/min   Anion gap 16 (H) 5 - 15  CK   Collection Time: 04/16/23 12:22 PM  Result Value Ref Range   Total CK 29 (L) 38 - 234 U/L  Lactic acid, plasma   Collection Time: 04/16/23 12:22 PM  Result Value Ref Range   Lactic Acid, Venous 1.0 0.5 - 1.9 mmol/L  Procalcitonin   Collection Time: 04/16/23 12:22 PM  Result Value Ref Range   Procalcitonin 0.40 ng/mL  Magnesium   Collection Time: 04/16/23 12:22 PM  Result Value Ref Range   Magnesium 1.5 (L) 1.7 - 2.4 mg/dL  Hemoglobin A4Z   Collection Time: 04/16/23 12:22 PM  Result Value Ref Range   Hgb A1c MFr Bld 5.8 (H) 4.8 - 5.6 %   Mean Plasma Glucose 119.76 mg/dL  Troponin I (High Sensitivity)   Collection Time: 04/16/23 12:22 PM  Result Value Ref Range   Troponin I (High Sensitivity) 22 (H) <18 ng/L  Urinalysis, Routine w reflex microscopic -Urine, Clean Catch   Collection Time: 04/16/23  2:31 PM  Result Value Ref Range   Color, Urine YELLOW (A) YELLOW   APPearance HAZY (A) CLEAR   Specific Gravity, Urine 1.009 1.005 - 1.030   pH 5.0 5.0 - 8.0   Glucose, UA NEGATIVE NEGATIVE mg/dL   Hgb urine dipstick LARGE (A) NEGATIVE   Bilirubin Urine NEGATIVE NEGATIVE   Ketones, ur 5 (A) NEGATIVE mg/dL   Protein, ur NEGATIVE NEGATIVE mg/dL   Nitrite NEGATIVE NEGATIVE   Leukocytes,Ua NEGATIVE NEGATIVE   RBC / HPF 11-20 0 - 5 RBC/hpf   WBC, UA 0-5 0 - 5 WBC/hpf   Bacteria, UA RARE (A) NONE SEEN   Squamous Epithelial / HPF 0-5 0 - 5 /HPF   Amorphous Crystal PRESENT   Lactic acid, plasma   Collection  Time: 04/16/23  2:59 PM  Result Value Ref Range   Lactic Acid, Venous 0.8 0.5 - 1.9 mmol/L  Troponin I (High Sensitivity)   Collection Time: 04/16/23  2:59 PM  Result Value Ref Range   Troponin I (High Sensitivity) 10 <18 ng/L  CBG monitoring, ED   Collection Time: 04/16/23  7:12 PM  Result Value Ref Range   Glucose-Capillary 136 (H) 70 - 99 mg/dL  Glucose, capillary   Collection Time: 04/16/23  8:45 PM  Result Value Ref Range   Glucose-Capillary 108 (H) 70 - 99 mg/dL  Basic metabolic panel   Collection Time: 04/17/23  3:38 AM  Result Value Ref Range   Sodium 134 (L) 135 - 145 mmol/L   Potassium 3.4 (L) 3.5 - 5.1 mmol/L   Chloride 102 98 - 111 mmol/L   CO2 23 22 - 32 mmol/L   Glucose, Bld  103 (H) 70 - 99 mg/dL   BUN 16 8 - 23 mg/dL   Creatinine, Ser 1.61 0.44 - 1.00 mg/dL   Calcium 7.8 (L) 8.9 - 10.3 mg/dL   GFR, Estimated >09 >60 mL/min   Anion gap 9 5 - 15  CBC   Collection Time: 04/17/23  3:38 AM  Result Value Ref Range   WBC 10.1 4.0 - 10.5 K/uL   RBC 3.35 (L) 3.87 - 5.11 MIL/uL   Hemoglobin 7.5 (L) 12.0 - 15.0 g/dL   HCT 45.4 (L) 09.8 - 11.9 %   MCV 70.7 (L) 80.0 - 100.0 fL   MCH 22.4 (L) 26.0 - 34.0 pg   MCHC 31.6 30.0 - 36.0 g/dL   RDW 14.7 (H) 82.9 - 56.2 %   Platelets 405 (H) 150 - 400 K/uL   nRBC 0.0 0.0 - 0.2 %  CBC   Collection Time: 04/17/23  5:06 AM  Result Value Ref Range   WBC 9.9 4.0 - 10.5 K/uL   RBC 3.24 (L) 3.87 - 5.11 MIL/uL   Hemoglobin 7.3 (L) 12.0 - 15.0 g/dL   HCT 13.0 (L) 86.5 - 78.4 %   MCV 68.5 (L) 80.0 - 100.0 fL   MCH 22.5 (L) 26.0 - 34.0 pg   MCHC 32.9 30.0 - 36.0 g/dL   RDW 69.6 (H) 29.5 - 28.4 %   Platelets 392 150 - 400 K/uL   nRBC 0.0 0.0 - 0.2 %  CK   Collection Time: 04/17/23  6:35 AM  Result Value Ref Range   Total CK 15 (L) 38 - 234 U/L  Magnesium   Collection Time: 04/17/23  6:35 AM  Result Value Ref Range   Magnesium 2.3 1.7 - 2.4 mg/dL  Type and screen [facility]   Collection Time: 04/17/23   6:35 AM  Result Value Ref Range   ABO/RH(D) A POS    Antibody Screen POS    Sample Expiration 04/20/2023,2359    Antibody Identification ANTI FYA (Duffy a)    Unit Number [id number]    Blood Component Type RBC LR PHER2    Unit division 00    Status of Unit ALLOCATED    Transfusion Status OK TO TRANSFUSE    Crossmatch Result      COMPATIBLE Performed at [facility], [address] [place], [place] [address]    Unit Number [id number]    Blood Component Type RBC, LR IRR    Unit division 00    Status of Unit ALLOCATED    Transfusion Status OK TO TRANSFUSE    Crossmatch Result COMPATIBLE    Unit Number [id number]    Blood Component Type RED CELLS,LR    Unit division 00    Status of Unit ALLOCATED    Transfusion Status OK TO TRANSFUSE    Crossmatch Result COMPATIBLE   BPAM RBC   Collection Time: 04/17/23  6:35 AM  Result Value Ref Range   Blood Product Unit Number [id number]    PRODUCT CODE T5573U20    Unit Type and Rh 5100    Blood Product Expiration Date 254270623762    Blood Product Unit Number [id number]    PRODUCT CODE T0626R48    Unit Type and Rh 5100    Blood Product Expiration Date 546270350093    Blood Product Unit Number [id number]    PRODUCT CODE E0382V00    Unit Type and Rh 5100    Blood Product Expiration Date 893810175102   Glucose, capillary  Collection Time: 04/17/23  8:08 AM  Result Value Ref Range   Glucose-Capillary 89 70 - 99 mg/dL  Prepare RBC (crossmatch)   Collection Time: 04/17/23  8:29 AM  Result Value Ref Range   Order Confirmation PENDING    I personally reviewed labs  Imaging: MRI cervical spine:2.3 x 3.8 x 2.9 cm (AP x transverse x craniocaudal) fluid collection in the laminectomy bed at C3-4 with moderately thick rim enhancement. More superficial 2.1 x 4.1 x 6.0 cm fluid collection within the subcutaneous tissues along the posterior cervical incision extending from C3-C7, also with peripheral  enhancement. Enhancement in the surrounding soft tissues.   Impression/Plan:  Ms. Ast is here after her prior posterior cervical decompression and fusion.  She show concern for likely infection and she is found to have bacteremia.  Given the prior surgery, MRI was performed which showed fluid collections at the site of surgery concerning for infection.  Given the presence of hardware, I do think it is prudent to proceed to the OR for irrigation and debridement.   1.  Diagnosis: Possible wound infection  2.  Plan -OR today for washout  Lucy Chris, MD Neurosurgery

## 2023-04-17 NOTE — Progress Notes (Signed)
       CROSS COVER NOTE  NAME: Summer Murphy MRN: [id number] DOB : [birth year] ATTENDING PHYSICIAN: Arnetha Courser, MD    Date of Service   04/17/2023   HPI/Events of Note   3 gm drop in hgb without signs of bleeding. Recent fall history  Interventions   Assessment/Plan:    04/17/2023    3:48 AM 04/17/2023   12:18 AM 04/16/2023   10:35 PM  Vitals with BMI  Systolic 98 130 115  Diastolic 59 58 56  Pulse 114 117 114       Latest Ref Rng & Units 04/17/2023    5:06 AM 04/17/2023    3:38 AM 04/16/2023   12:22 PM  CBC  WBC 4.0 - 10.5 K/uL 9.9  10.1  9.1   Hemoglobin 12.0 - 15.0 g/dL 7.3  7.5  08.6   Hematocrit 36.0 - 46.0 % 22.2  23.7  34.4   Platelets 150 - 400 K/uL 392  405  332     Repeat CK level Stat type and screen Repeat mag level      Donnie Mesa NP [facility] Cross Cover 7pm-7am - check amion for availability Pager [phone]

## 2023-04-17 NOTE — Anesthesia Postprocedure Evaluation (Signed)
Anesthesia Post Note  Patient: Summer Murphy  Procedure(s) Performed: CERVICAL WOUND DEBRIDEMENT (Spine Cervical)  Patient location during evaluation: PACU Anesthesia Type: General Level of consciousness: awake and alert Pain management: pain level controlled Vital Signs Assessment: post-procedure vital signs reviewed and stable Respiratory status: spontaneous breathing, nonlabored ventilation, respiratory function stable and patient connected to nasal cannula oxygen Cardiovascular status: blood pressure returned to baseline and stable Postop Assessment: no apparent nausea or vomiting Anesthetic complications: no  No notable events documented.   Last Vitals:  Vitals:   04/17/23 1540 04/17/23 2122  BP: (!) 168/72 135/73  Pulse: (!) 108 90  Resp: 18 18  Temp: 37.6 C 36.7 C  SpO2: 95% 98%    Last Pain:  Vitals:   04/17/23 2122  TempSrc: Oral  PainSc:                  Stephanie Coup

## 2023-04-17 NOTE — Assessment & Plan Note (Signed)
Patient with history of iron deficiency anemia, hemoglobin decreased to 7.3 this morning, no obvious bleeding. Type and screen positive for antibodies -Checking iron and ferritin levels -1 unit of PRBC ordered -Starting on supplement -Monitor hemoglobin -Check FOBT

## 2023-04-17 NOTE — Assessment & Plan Note (Addendum)
Patient met sepsis criteria with fever and tachycardia, blood cultures growing Staph aureus-pending susceptibility. MRI of cervical spine with concern of a fluid collection along posterior incision for recent cervical fusion injury, infection or abscess cannot be excluded.  Neurosurgery was consulted. Patient is going to the OR for washout -Started on cefazolin per pharmacy -Echocardiogram -Consult ID -Follow neurosurgery recommendations

## 2023-04-17 NOTE — Anesthesia Preprocedure Evaluation (Signed)
Anesthesia Evaluation  Patient identified by MRN, date of birth, ID band Patient awake    Reviewed: Allergy & Precautions, NPO status , Patient's Chart, lab work & pertinent test results  History of Anesthesia Complications Negative for: history of anesthetic complications  Airway Mallampati: III  TM Distance: >3 FB Neck ROM: Full    Dental  (+) Missing, Poor Dentition, Dental Advidsory Given   Pulmonary neg pulmonary ROS, asthma , COPD   Pulmonary exam normal breath sounds clear to auscultation       Cardiovascular hypertension, Normal cardiovascular exam+ dysrhythmias  Rhythm:Regular Rate:Normal  ECG 02/22/23:  Normal sinus rhythm Non-specific intra-ventricular conduction block Cannot rule out Anterior infarct , age undetermined T wave abnormality, consider lateral ischemia  Myocardial perfusion 06/27/21:    Findings are consistent with no prior ischemia. The study is low risk.   No ST deviation was noted.   LV perfusion is equivocal. There is no evidence of ischemia. There is no evidence of infarction.   Left ventricular function is normal. End diastolic cavity size is normal. End systolic cavity size is normal.   There is a fixed septal wall defect likely due to left bundle branch block artifact.  The study is suboptimal due to this as well as extracardiac uptake.   CT attenuation images with no significant coronary calcifications    Neuro/Psych  Headaches PSYCHIATRIC DISORDERS Anxiety Depression    Chronic pain negative neurological ROS  negative psych ROS   GI/Hepatic negative GI ROS, Neg liver ROS, hiatal hernia,GERD  ,,  Endo/Other  negative endocrine ROSdiabetes, Type 2    Renal/GU      Musculoskeletal   Abdominal   Peds  Hematology negative hematology ROS (+)   Anesthesia Other Findings Past Medical History: No date: Acid reflux No date: Anemia No date: Anxiety     Comment:  a.) buspirone BID +  BZO PRN (clonazepam) No date: Arthritis No date: Asthma No date: Cervical disc disorder with radiculopathy of cervical region No date: Chronic pain syndrome     Comment:  a.) on COT as prescribed by pain mangement No date: Chronic, continuous use of opioids     Comment:  a.) followed by pain mangement No date: COPD (chronic obstructive pulmonary disease) (HCC) No date: DOE (dyspnea on exertion) No date: Hematuria No date: Hiatal hernia No date: History of kidney stones No date: Hypertension No date: Hypoglycemia No date: Insomnia     Comment:  a.) uses Trazodone PRN No date: LBBB (left bundle branch block) No date: Migraine No date: Murmur No date: Osteopenia No date: Palpitations No date: Pre-diabetes No date: Restless leg     Comment:  a.) on ropinirole No date: Stenosis of cervical spine with myelopathy (HCC) No date: T2DM (type 2 diabetes mellitus) (HCC)  Past Surgical History: No date: ABDOMINAL HYSTERECTOMY 07/31/2016: AMPUTATION TOE; Left     Comment:  Procedure: AMPUTATION TOE/MPJ 2nd toe;  Surgeon: Linus Galas, DPM;  Location: [facility] ORS;  Service: Podiatry;                Laterality: Left; 1990: APPENDECTOMY No date: BACK SURGERY     Comment:  low back No date: BREAST SURGERY     Comment:  bilateral breast reduction No date: CARDIAC ELECTROPHYSIOLOGY STUDY AND ABLATION 1990: CHOLECYSTECTOMY 01/04/2017: COLONOSCOPY WITH PROPOFOL; N/A     Comment:  Procedure: COLONOSCOPY WITH PROPOFOL;  Surgeon: Mechele Collin,  Wilber Bihari, MD;  Location: [facility] ENDOSCOPY;  Service:               Endoscopy;  Laterality: N/A; No date: CORNEAL TRANSPLANT 04/09/2021: ESOPHAGOGASTRODUODENOSCOPY; N/A     Comment:  Procedure: ESOPHAGOGASTRODUODENOSCOPY (EGD);  Surgeon:               Toney Reil, MD;  Location: [facility] ENDOSCOPY;                Service: Gastroenterology;  Laterality: N/A; 01/04/2017: ESOPHAGOGASTRODUODENOSCOPY (EGD) WITH PROPOFOL; N/A     Comment:   Procedure: ESOPHAGOGASTRODUODENOSCOPY (EGD) WITH               PROPOFOL;  Surgeon: Scot Jun, MD;  Location:               [facility] ENDOSCOPY;  Service: Endoscopy;  Laterality: N/A; 07/31/2016: EXCISION BONE CYST; Left     Comment:  Procedure: EXCISION BONE CYST/exostectomy 28124/left               2nd;  Surgeon: Linus Galas, DPM;  Location: [facility] ORS;                Service: Podiatry;  Laterality: Left; 09/12/2015: EXTRACORPOREAL SHOCK WAVE LITHOTRIPSY; Left     Comment:  Procedure: EXTRACORPOREAL SHOCK WAVE LITHOTRIPSY (ESWL);              Surgeon: Vanna Scotland, MD;  Location: [facility] ORS;                Service: Urology;  Laterality: Left; No date: FRACTURE SURGERY     Comment:  left foot No date: GUM SURGERY; Left     Comment:  gum infection 11/14/2020: HARDWARE REMOVAL; Left     Comment:  Procedure: HARDWARE REMOVAL;  Surgeon: Kennedy Bucker,               MD;  Location: [facility] ORS;  Service: Orthopedics;                Laterality: Left; No date: [facility] repair     Comment:  Fundoplication 2013,2014: JOINT REPLACEMENT; Bilateral     Comment:  total knees No date: LAPAROSCOPIC HYSTERECTOMY No date: LITHOTRIPSY 02/16/2020: periprosthetic supracondylar fracture of left femur     Comment:  [facility] 03/02/2023: POSTERIOR CERVICAL FUSION/FORAMINOTOMY; N/A     Comment:  Procedure: C3-6 POSTERIOR CERVICAL LAMINECTOMY AND               FUSION;  Surgeon: Lovenia Kim, MD;  Location: [facility]               ORS;  Service: Neurosurgery;  Laterality: N/A; No date: TONSILLECTOMY 11/14/2019: TOTAL KNEE REVISION; Right     Comment:  Procedure: Revision patella and tibial polyethylene;                Surgeon: Kennedy Bucker, MD;  Location: [facility] ORS;                Service: Orthopedics;  Laterality: Right; No date: URETEROSCOPY  BMI    Body Mass Index: 22.18 kg/m      Reproductive/Obstetrics negative OB ROS                             Anesthesia  Physical Anesthesia Plan  ASA: 4  Anesthesia Plan: General ETT   Post-op Pain Management:    Induction: Intravenous  PONV Risk Score and Plan: Ondansetron and  Midazolam  Airway Management Planned: Oral ETT  Additional Equipment:   Intra-op Plan:   Post-operative Plan: Extubation in OR  Informed Consent: I have reviewed the patients History and Physical, chart, labs and discussed the procedure including the risks, benefits and alternatives for the proposed anesthesia with the patient or authorized representative who has indicated his/her understanding and acceptance.     Dental Advisory Given  Plan Discussed with: Anesthesiologist, CRNA and Surgeon  Anesthesia Plan Comments: (Patient consented for risks of anesthesia including but not limited to:  - adverse reactions to medications - damage to eyes, teeth, lips or other oral mucosa - nerve damage due to positioning  - sore throat or hoarseness - Damage to heart, brain, nerves, lungs, other parts of body or loss of life  Patient voiced understanding and assent.)       Anesthesia Quick Evaluation

## 2023-04-18 ENCOUNTER — Inpatient Hospital Stay (HOSPITAL_COMMUNITY)
Admit: 2023-04-18 | Discharge: 2023-04-18 | Disposition: A | Discharge status: Home / Self Care | Payer: 59 | Attending: Internal Medicine | Admitting: Internal Medicine

## 2023-04-18 ENCOUNTER — Encounter: Payer: Self-pay | Admitting: Neurosurgery

## 2023-04-18 DIAGNOSIS — G2581 Restless legs syndrome: Secondary | ICD-10-CM | POA: Diagnosis not present

## 2023-04-18 DIAGNOSIS — R7881 Bacteremia: Secondary | ICD-10-CM

## 2023-04-18 DIAGNOSIS — R509 Fever, unspecified: Secondary | ICD-10-CM | POA: Diagnosis not present

## 2023-04-18 DIAGNOSIS — R296 Repeated falls: Secondary | ICD-10-CM | POA: Diagnosis not present

## 2023-04-18 DIAGNOSIS — A4101 Sepsis due to Methicillin susceptible Staphylococcus aureus: Secondary | ICD-10-CM | POA: Diagnosis not present

## 2023-04-18 LAB — RENAL FUNCTION PANEL
Albumin: 2.1 g/dL — ABNORMAL LOW (ref 3.5–5.0)
Anion gap: 7 (ref 5–15)
BUN: 24 mg/dL — ABNORMAL HIGH (ref 8–23)
CO2: 22 mmol/L (ref 22–32)
Calcium: 8 mg/dL — ABNORMAL LOW (ref 8.9–10.3)
Chloride: 102 mmol/L (ref 98–111)
Creatinine, Ser: 0.56 mg/dL (ref 0.44–1.00)
GFR, Estimated: >60 mL/min (ref >60)
Glucose, Bld: 132 mg/dL — ABNORMAL HIGH (ref 70–99)
Phosphorus: 2.9 mg/dL (ref 2.5–4.6)
Potassium: 4.8 mmol/L (ref 3.5–5.1)
Sodium: 131 mmol/L — ABNORMAL LOW (ref 135–145)

## 2023-04-18 LAB — CBC
HCT: 26 % — ABNORMAL LOW (ref 36.0–46.0)
Hemoglobin: 8.4 g/dL — ABNORMAL LOW (ref 12.0–15.0)
MCH: 23.3 pg — ABNORMAL LOW (ref 26.0–34.0)
MCHC: 32.3 g/dL (ref 30.0–36.0)
MCV: 72.2 fL — ABNORMAL LOW (ref 80.0–100.0)
Platelets: 323 10*3/uL (ref 150–400)
RBC: 3.6 MIL/uL — ABNORMAL LOW (ref 3.87–5.11)
RDW: 22.3 % — ABNORMAL HIGH (ref 11.5–15.5)
WBC: 11.6 10*3/uL — ABNORMAL HIGH (ref 4.0–10.5)
nRBC: 0 % (ref 0.0–0.2)

## 2023-04-18 LAB — URINE CULTURE: Culture: NO GROWTH

## 2023-04-18 LAB — GLUCOSE, CAPILLARY
Glucose-Capillary: 112 mg/dL — ABNORMAL HIGH (ref 70–99)
Glucose-Capillary: 115 mg/dL — ABNORMAL HIGH (ref 70–99)
Glucose-Capillary: 103 mg/dL — ABNORMAL HIGH (ref 70–99)
Glucose-Capillary: 114 mg/dL — ABNORMAL HIGH (ref 70–99)

## 2023-04-18 NOTE — Evaluation (Signed)
Occupational Therapy Evaluation Patient Details Name: Summer Murphy MRN: [id number] DOB: [birth year] Today's Date: 04/18/2023   History of Present Illness Patient is a 68 year old female with sepsis, wound infection, s/p cervical wound debridement. History of recent fall, C3-C6 laminectomy and posterior fusion 03/02/23, COPD, barrettts esophagus, hiatus hernia, HTN.   Clinical Impression   Pt was seen for OT evaluation this date and co-tx with PT to optimize safety with ADL/mobility. Prior to hospital admission, pt was living at home alone with her dog and has PRN family/friend support. Pt lives in an elevator-accessible apartment with walk-in shower and hasn't driven recently. She has assist for IADL and uses SW for mobility. Pt presents to acute OT demonstrating impaired ADL performance and functional mobility 2/2 decreased strength, balance, and activity tolerance, and questionable awareness of safety/deficits (See OT problem list for additional functional deficits). Pt currently requires CGA for ADL transfers and standing pericare/clothing mgt, VC for hand placement with transfers, PRN MIN A for LB ADL tasks. Pt would benefit from skilled OT services to address noted impairments and functional limitations (see below for any additional details) in order to maximize safety and independence while minimizing falls risk and caregiver burden.     If plan is discharge home, recommend the following: A little help with walking and/or transfers;A little help with bathing/dressing/bathroom;Assistance with cooking/housework;Assist for transportation;Help with stairs or ramp for entrance    Functional Status Assessment  Patient has had a recent decline in their functional status and demonstrates the ability to make significant improvements in function in a reasonable and predictable amount of time.  Equipment Recommendations  Other (comment) (2ww)    Recommendations for Other Services        Precautions / Restrictions Precautions Precautions: Fall (general spine precautions s/p I&D) Precaution Comments: hemovac Restrictions Weight Bearing Restrictions Per Provider Order: No      Mobility Bed Mobility Overal bed mobility: Needs Assistance Bed Mobility: Supine to Sit     Supine to sit: Contact guard     General bed mobility comments: cues for logroll technique with carry over demonstrated    Transfers Overall transfer level: Needs assistance Equipment used: Rolling walker (2 wheels) Transfers: Sit to/from Stand Sit to Stand: Mod assist, Min assist, +2 safety/equipment           General transfer comment: Mod A for standing from bed and Min A for standing from bed side commode. cues for hand placement for safety      Balance Overall balance assessment: Needs assistance Sitting-balance support: Feet supported Sitting balance-Leahy Scale: Fair     Standing balance support: No upper extremity supported, During functional activity Standing balance-Leahy Scale: Fair Standing balance comment: CGA to stand by assistance provided for standing to wash hands at the sink                           ADL either performed or assessed with clinical judgement   ADL Overall ADL's : Needs assistance/impaired     Grooming: Standing;Contact guard assist;Wash/dry hands                   Toilet Transfer: Contact guard assist;BSC/3in1;Regular Toilet;Rolling walker (2 wheels) Toilet Transfer Details (indicate cue type and reason): BSC over toilet, VC for hand placement Toileting- Clothing Manipulation and Hygiene: Sit to/from stand;Contact guard assist       Functional mobility during ADLs: Contact guard assist;Cueing for safety;Rolling walker (2 wheels)  Vision         Perception         Praxis         Pertinent Vitals/Pain Pain Assessment Pain Assessment: Faces Faces Pain Scale: Hurts little more Pain Location: posterior neck Pain  Descriptors / Indicators: Discomfort, Grimacing Pain Intervention(s): Limited activity within patient's tolerance, Monitored during session, Repositioned     Extremity/Trunk Assessment Upper Extremity Assessment Upper Extremity Assessment: Generalized weakness   Lower Extremity Assessment Lower Extremity Assessment: Generalized weakness       Communication Communication Communication: No apparent difficulties   Cognition Arousal: Alert Behavior During Therapy: WFL for tasks assessed/performed Overall Cognitive Status: No family/caregiver present to determine baseline cognitive functioning                                 General Comments: Patient is a poor historian at times. She is able to follow single step commands with increased time. Mild confusion about recent events     General Comments       Exercises     Shoulder Instructions      Home Living Family/patient expects to be discharged to:: Private residence Living Arrangements: Alone Available Help at Discharge: Friend(s);Available PRN/intermittently;Neighbor Type of Home: Apartment Home Access: Elevator     Home Layout: One level     Bathroom Shower/Tub: Producer, television/film/video: Standard     Home Equipment: Shower seat - built in;Grab bars - tub/shower;Rollator (4 wheels);Standard Walker;Cane - single point;Adaptive equipment Adaptive Equipment: Reacher Additional Comments: patient has difficulty describing DME, some information gathered from prior admission      Prior Functioning/Environment Prior Level of Function : History of Falls (last six months);Independent/Modified Independent             Mobility Comments: Mod I using 4 wheeled walker or standard walker. Limited walking since surgery due to fear of falling ADLs Comments: assistance for transportation        OT Problem List: Decreased strength;Decreased activity tolerance;Decreased safety awareness;Decreased  knowledge of use of DME or AE;Impaired balance (sitting and/or standing)      OT Treatment/Interventions: Self-care/ADL training;Therapeutic exercise;Therapeutic activities;DME and/or AE instruction;Patient/family education;Balance training    OT Goals(Current goals can be found in the care plan section) Acute Rehab OT Goals Patient Stated Goal: go home OT Goal Formulation: With patient Time For Goal Achievement: 05/02/23 Potential to Achieve Goals: Good ADL Goals Pt Will Perform Lower Body Dressing: with modified independence;sit to/from stand (AE PRN) Pt Will Transfer to Toilet: with modified independence;ambulating (LRAD) Pt Will Perform Toileting - Clothing Manipulation and hygiene: with modified independence Additional ADL Goal #1: Pt will complete standing grooming tasks at sink with UE support on counter as needed >57min with no LOB or safety concerns.  OT Frequency: Min 1X/week    Co-evaluation PT/OT/SLP Co-Evaluation/Treatment: Yes Reason for Co-Treatment: Complexity of the patient's impairments (multi-system involvement) PT goals addressed during session: Mobility/safety with mobility OT goals addressed during session: ADL's and self-care      AM-PAC OT "6 Clicks" Daily Activity     Outcome Measure Help from another person eating meals?: None Help from another person taking care of personal grooming?: A Little Help from another person toileting, which includes using toliet, bedpan, or urinal?: A Little Help from another person bathing (including washing, rinsing, drying)?: A Little Help from another person to put on and taking off regular upper body clothing?: A Little  Help from another person to put on and taking off regular lower body clothing?: A Little 6 Click Score: 19   End of Session Equipment Utilized During Treatment: Gait belt;Rolling walker (2 wheels) Nurse Communication: Mobility status  Activity Tolerance: Patient tolerated treatment well Patient left: in  chair;with call bell/phone within reach;with chair alarm set  OT Visit Diagnosis: Muscle weakness (generalized) (M62.81);Repeated falls (R29.6)                Time: 1610-9604 OT Time Calculation (min): 23 min Charges:  OT General Charges $OT Visit: 1 Visit OT Evaluation $OT Eval Low Complexity: 1 Low  Arman Filter., MPH, MS, OTR/L ascom [phone] 04/18/23, 2:45 PM

## 2023-04-18 NOTE — Plan of Care (Signed)
  Problem: Coping: Goal: Ability to adjust to condition or change in health will improve Outcome: Progressing   Problem: Nutritional: Goal: Maintenance of adequate nutrition will improve Outcome: Progressing   Problem: Skin Integrity: Goal: Risk for impaired skin integrity will decrease Outcome: Progressing   Problem: Education: Goal: Knowledge of General Education information will improve Description: Including pain rating scale, medication(s)/side effects and non-pharmacologic comfort measures Outcome: Progressing   Problem: Activity: Goal: Risk for activity intolerance will decrease Outcome: Progressing   Problem: Nutrition: Goal: Adequate nutrition will be maintained Outcome: Progressing   Problem: Coping: Goal: Level of anxiety will decrease Outcome: Progressing   Problem: Elimination: Goal: Will not experience complications related to bowel motility Outcome: Progressing Goal: Will not experience complications related to urinary retention Outcome: Progressing   Problem: Pain Managment: Goal: General experience of comfort will improve and/or be controlled Outcome: Progressing   Problem: Safety: Goal: Ability to remain free from injury will improve Outcome: Progressing   Problem: Skin Integrity: Goal: Risk for impaired skin integrity will decrease Outcome: Progressing

## 2023-04-18 NOTE — Progress Notes (Addendum)
Transition of Care [facility]) - Inpatient Brief Assessment   Patient Details  Name: Summer Murphy MRN: [id number] Date of Birth: 1956-02-13  Transition of Care [facility]) CM/SW Contact:    Bing Quarry, RN Phone Number: 04/18/2023, 10:49 AM   Clinical Narrative: 04/18/23: Admitted on 1/24 via EMS from Home after a fall with bilateral knee and lower back of neck pain with history of cervical surgery 6 weeks ago. Lives alone with history of recent falls. She was febrile on presentation to [facility] ED. Neurology consulted with dx of wound infection and cervical wound debridement on 04/16/24. PT unable to evaluate on 1/25 but were able in the am 1/26.   DME at home: Home Equipment: Shower seat - built in; Engineer, materials - tub/shower; Rollator (4 wheels); Standard Walker; Cane - single point; Adaptive equipment   1/26: PT recs HH, likely Pt/OT/Aide. Was active with [facility], contacted and they will pick up again on discharge and when orders placed (not in yet).   TOC to follow through disposition.    Gabriel Cirri MSN RN CM  RN Case Manager [place]  Transitions of Care Direct Dial: [phone] (Weekends Only) [facility] Main Office Phone: [phone] [facility] Fax: [phone] [email or web address]      Transition of Care Asessment: Insurance and Status: Insurance coverage has been reviewed Patient has primary care physician: Yes Home environment has been reviewed: Presents via EMS from home s/p fall Prior level of function:: Prior Level of Function : History of Falls (last six months);Independent/Modified Independent  Mobility Comments: Mod I using 4 wheeled walker or standard walker. Limited walking since surgery due to fear of falling  ADLs Comments: assistance for transportation Prior/Current Home Services: No current home services Social Drivers of Health Review: SDOH reviewed needs interventions Readmission risk has been reviewed: Yes Transition of care needs: transition of care needs identified, TOC will  continue to follow (Transportation Resrouces or [place] and [facility] Medicare/Dual Complete assistance added to AVS via Care Coordination patient instructions.)

## 2023-04-18 NOTE — Discharge Instructions (Signed)
[employer] Medicare/Dual Complete Tranporation Resouces/Assistance.   [employer] will be the transportation benefit provider for [employer] members.  To schedule a ride, members can call [phone], [phone] days before their appointment.  Visit [email or web address] for more information about being a transportation provider with [employer], and frequently asked questions.     [place] [employer] members are not eligible to receive non-emergency transportation services.  If you are a Psychiatrist and interested in providing services to [facility] members, you can contact [employer] as follows:  Email: [email or web address] Phone: [phone] Option 2# Website: [email or web address]    [employer] Name: [facility] Agency Address: [address] [place], [place] [address] Phone: [phone] Email: [email or web address] Website: [email or web address] Service(s) Offered: Housing services, self-sufficiency, congregate meal program, weatherization program, Field seismologist program, emergency food assistance,  housing counseling, home ownership program, wheels-towork program.  Agency Name: [facility] [employer] [phone]) Address: [address] [place], [place] [address] Phone: [phone] Website: [email or web address] Service(s) Offered: Transportation for [employer], subscription and demand response; Dial-a-Ride for citizens 61 years of age or older.  Agency Name: Department of Social Services Address: [address] [place], [place] [address] Phone: [phone] Service(s) Offered: Child support services; child welfare services; food stamps; Medicaid; work first family assistance; and aid with fuel,   rent, food and medicine, transportation assistance.  Agency Name: Disabled [employer] (DAV) Transportation  Network Phone: [phone] Service(s) Offered: Transports veterans to the [facility] medical center. Call  forty-eight hours in advance and leave the name, telephone  number, date, and time of appointment. Veteran will be  contacted by the driver the day before the appointment to  arrange a pick up point   Transportation Resources  Agency Name: [facility] Agency Address: [address] [place], [place] [address] Phone: [phone] Email: [email or web address] Website: [email or web address] Service(s) Offered: Housing services, self-sufficiency, congregate meal program, weatherization program, Field seismologist program, emergency food assistance,  housing counseling, home ownership program, wheels-towork program.  Agency Name: [facility] [employer] [phone]) Address: [address], [place], [place] [address] Phone: [phone] Website: [email or web address] Service(s) Offered: Transportation for [employer], subscription and demand response; Dial-a-Ride for citizens 9 years of age or older.  Agency Name: Department of Social Services Address: [address] [place], [place] [address] Phone: [phone] Service(s) Offered: Child support services; child welfare services; food stamps; Medicaid; work first family assistance; and aid with fuel,  rent, food and medicine, transportation assistance.  Agency Name: Disabled [employer] (DAV) Transportation  Network Phone: [phone] Service(s) Offered: Transports veterans to the [facility] medical center. Call  forty-eight hours in advance and leave the name, telephone  number, date, and time of appointment. Veteran will be  contacted by the driver the day before the appointment to  arrange a pick up point    [employer]A currently provides door to door services. ACTA connects with PART daily for services to [facility]. ACTA also performs contract services to [employer] operates 27 vehicles, all but 3 mini-vans are equipped with lifts for special needs as well as the general public. ACTA drivers are each CDL certified and trained in First Aid and CPR. [employer] was established in 2002 by [employer]. An independent Industrial/product designer. ACTA operates via Cytogeneticist with required Research scientist (physical sciences) from [place]. [employer] provides over 80,000  passenger trips each year, including [facility] and [employer] sites.  Call at least by 11 AM one business day prior to needing transportation  [employer].                      [place], [place] [address]     Office Hours: Monday-Friday  8 AM - 5 PM

## 2023-04-18 NOTE — Progress Notes (Signed)
Mobility Specialist - Progress Note     04/18/23 1615  Mobility  Activity Ambulated with assistance in hallway;Stood at bedside  Level of Assistance Standby assist, set-up cues, supervision of patient - no hands on  Assistive Device Front wheel walker  Distance Ambulated (ft) 40 ft  Range of Motion/Exercises Active  Activity Response Tolerated well  Mobility Referral Yes  Mobility visit 1 Mobility   Pt resting in bed on RA upon entry. Pt endorses pain in right thigh (like a pulling pain) but agreeable to participate in ambulation. Pt STS and ambulates to hallway and returns to bed. Pt left in bed with needs in reach, bed alarm activated.   Johnathan Hausen Mobility Specialist 04/18/23, 4:19 PM

## 2023-04-18 NOTE — Progress Notes (Signed)
Progress Note   Date: 04/18/2023  [name] is now postop day 1 from her cervical wound debridement.   Interval events: She states her neck pain is better this morning.  She has had no other complaints.   Vital Signs: Temp:  [97.4 F (36.3 C)-99.7 F (37.6 C)] 98.5 F (36.9 C) (01/26 0805) Pulse Rate:  [68-114] 89 (01/26 0805) Resp:  [9-24] 18 (01/26 0805) BP: (102-168)/(51-88) 139/62 (01/26 0805) SpO2:  [95 %-100 %] 100 % (01/26 0805) Temp (24hrs), Avg:98.4 F (36.9 C), Min:97.4 F (36.3 C), Max:99.7 F (37.6 C)   Problem List Patient Active Problem List   Diagnosis Date Noted   Fever 04/16/2023   Falls 04/16/2023   Electrolyte abnormality 04/16/2023   Chronic pain syndrome    S/P cervical spinal fusion 04/12/2023   Cervical myelopathy (HCC) 03/02/2023   T2DM (type 2 diabetes mellitus) (HCC) 02/28/2023   Spinal stenosis in cervical region 02/04/2023   Stenosis of cervical spine with myelopathy (HCC) 02/04/2023   Cervical radicular pain (left severe) 12/10/2022   Foraminal stenosis of cervical region 11/25/2022   Cervical disc disorder with radiculopathy of cervical region 11/25/2022   Cervicogenic headache 11/25/2022   Chronic asthmatic bronchitis (HCC) 06/12/2022   Uncontrolled type 2 diabetes mellitus with hyperglycemia, without long-term current use of insulin (HCC) 06/03/2022   Aspiration pneumonia (HCC) 06/02/2022   Anxiety and depression 06/02/2022   Essential hypertension 06/02/2022   Acute respiratory failure with hypoxia (HCC) 06/02/2022   Asthma 06/01/2022   DOE (dyspnea on exertion) 05/13/2022   Multifocal pneumonia 05/12/2022   Hypertensive urgency 05/12/2022   Sepsis (HCC) 05/12/2022   CAP (community acquired pneumonia) 04/24/2022   PNA (pneumonia) 04/24/2022   Hypocalcemia 07/31/2021   Esophageal dysphagia    Benign esophageal stricture    S/P hardware removal 11/14/2020   Microcytic anemia 10/18/2020   Moderate persistent asthma with  acute exacerbation 08/21/2020   S/P revision of total knee, right 11/14/2019   Seasonal allergic rhinitis due to pollen 09/20/2019   Callus of foot 08/13/2019   Candidal vaginitis 08/13/2019   Other symptoms and signs involving the nervous system 08/13/2019   Encounter for general adult medical examination with abnormal findings 08/13/2019   Restless leg 04/30/2019   Muscle cramps 04/02/2019   Diastolic dysfunction 12/14/2018   SOB (shortness of breath) 12/14/2018   Acid reflux 12/14/2018   Wheezing 12/14/2018   Impaired fasting glucose 12/14/2018   COPD with acute exacerbation (HCC) 09/28/2018   Acute upper respiratory infection 08/25/2018   Recurrent sinusitis 08/15/2018   Prediabetes 08/15/2018   Nonintractable headache 08/15/2018   Dysuria 08/15/2018   Migraine 04/22/2018   Nausea 04/22/2018   Polyneuropathy associated with underlying disease (HCC) 04/22/2018   Midline low back pain without sciatica 04/22/2018   Routine cervical smear 04/22/2018   HTN (hypertension) 07/01/2017   Acute bronchitis with asthma 07/01/2017   Allergic rhinitis 07/01/2017   Moderate asthma without complication 01/20/2016   Severe recurrent major depression without psychotic features (HCC) 09/10/2014   COPD (chronic obstructive pulmonary disease) (HCC) 09/10/2014   Calculi, ureter 04/26/2013   Corneal graft malfunction 08/17/2012   Calculus of kidney 04/26/2012   Renal colic 04/26/2012   Urge incontinence 04/26/2012   Diaphragmatic hernia 10/27/2010   Barrett esophagus 07/07/2010    Medications: Scheduled Meds:  busPIRone  10 mg Oral BID   celecoxib  100 mg Oral BID   DULoxetine  60 mg Oral Daily   enoxaparin (LOVENOX) injection  40 mg  Subcutaneous Q24H   Fe Fum-Vit C-Vit B12-FA  1 capsule Oral BID   fluticasone furoate-vilanterol  1 puff Inhalation Daily   gabapentin  900 mg Oral BID   insulin aspart  0-15 Units Subcutaneous TID WC   insulin aspart  0-5 Units Subcutaneous QHS    montelukast  10 mg Oral QHS   pantoprazole  20 mg Oral Daily   rOPINIRole  1 mg Oral QHS   sodium chloride flush  3 mL Intravenous Q12H   traZODone  100 mg Oral QHS   Continuous Infusions:   ceFAZolin (ANCEF) IV 2 g (04/18/23 0811)   dextrose 50 mL/hr at 04/18/23 0441   PRN Meds:.acetaminophen **OR** acetaminophen, clonazePAM, ipratropium-albuterol, ondansetron **OR** ondansetron (ZOFRAN) IV, polyethylene glycol, SUMAtriptan  Labs:  Lab Results  Component Value Date   WBC 11.6 (H) 04/18/2023   WBC 9.9 04/17/2023   HCT 26.0 (L) 04/18/2023   HCT 27.1 (L) 04/17/2023   HCT 39.4 08/29/2021   HCT 41.2 07/01/2021   PLT 323 04/18/2023   PLT 392 04/17/2023   PLT 186 08/29/2021   PLT 216 07/01/2021    Lab Results  Component Value Date   INR 1.1 05/13/2022   APTT 26.7 10/17/2013    Lab Results  Component Value Date   NA 131 (L) 04/18/2023   NA 134 (L) 04/17/2023   NA 142 01/15/2022   NA 141 08/29/2021   NA 136 06/28/2014   NA 140 02/06/2014   K 4.8 04/18/2023   K 3.4 (L) 04/17/2023   K 3.5 06/28/2014   K 4.0 02/06/2014   BUN 24 (H) 04/18/2023   BUN 16 04/17/2023   BUN 11 01/15/2022   BUN 14 08/29/2021   BUN 13 06/28/2014   BUN 13 02/06/2014    Lab Results  Component Value Date   MG 2.3 04/17/2023   MG 2.1 06/14/2013   Exam: She has 4+ out of 5 strength throughout bilateral upper extremities, and bilateral lower extremities with exception of right hip flexion which is limited by pain. Sensation appears intact throughout all extremities  Hemovac in place  Assessment/plan Patient is postop day 1 from cervical wound debridement  Follow-up cultures, infectious disease is managing antibiotics Okay for DVT prophylaxis We will continue Hemovac for 1 more day Clear for PT as tolerated  Will need staple removal in 14 days   Lucy Chris, MD

## 2023-04-18 NOTE — Assessment & Plan Note (Signed)
Hyponatremia with mild worsening, sodium at 131, hypomagnesemia and hypokalemia resolved -Continue to monitor

## 2023-04-18 NOTE — Progress Notes (Signed)
Echocardiogram 2D Echocardiogram has been performed.  Summer Murphy 04/18/2023, 4:27 PM

## 2023-04-18 NOTE — Assessment & Plan Note (Signed)
MSSA bacteremia. Patient met sepsis criteria with fever and tachycardia, blood cultures growing Staph aureus-pending susceptibility. MRI of cervical spine with concern of a fluid collection along posterior incision for recent cervical fusion injury, infection or abscess cannot be excluded.  Neurosurgery was consulted. Patient is going to the OR for washout, preliminary cultures with no organisms so far -Continue cefazolin per pharmacy -Echocardiogram-done with pending results -Consult ID -Follow neurosurgery recommendations

## 2023-04-18 NOTE — Evaluation (Signed)
Physical Therapy Evaluation Patient Details Name: Summer Murphy MRN: [id number] DOB: [birth year] Today's Date: 04/18/2023  History of Present Illness  Patient is a 68 year old female with sepsis, wound infection, s/p cervical wound debridement. History of recent fall, C3-C6 laminectomy and posterior fusion 03/02/23, COPD, barrettts esophagus, hiatus hernia, HTN.  Clinical Impression  Patient is agreeable to PT evaluation. She reports she lives alone in an apartment with supportive neighbors. She has a rollator and what sounds like a standard walker for ambulation. She does report 2 falls since her surgery in December.  Today the patient was able to ambulate a short distance with rolling walker with CGA. She reports feeling generalized weakness in her legs. She did require assistance to stand from bed and less assistance for standing from bed side commode. The patient is hopeful to return home if possible. PT will continue to follow to maximize independence and decrease caregiver burden.       If plan is discharge home, recommend the following: A little help with walking and/or transfers;A little help with bathing/dressing/bathroom;Assist for transportation;Assistance with cooking/housework   Can travel by private vehicle        Equipment Recommendations None recommended by PT  Recommendations for Other Services       Functional Status Assessment Patient has had a recent decline in their functional status and demonstrates the ability to make significant improvements in function in a reasonable and predictable amount of time.     Precautions / Restrictions Precautions Precautions: Fall (general spine precuations s/p recent surgery) Precaution Comments: hemovac Restrictions Weight Bearing Restrictions Per Provider Order: No      Mobility  Bed Mobility Overal bed mobility: Needs Assistance Bed Mobility: Supine to Sit     Supine to sit: Contact guard     General bed mobility  comments: cues for logroll technique with carry over demonstrated    Transfers Overall transfer level: Needs assistance Equipment used: Rolling walker (2 wheels) Transfers: Sit to/from Stand Sit to Stand: Mod assist, Min assist           General transfer comment: Mod A for standing from bed and Min A for standing from bed side commode. cues for hand placement for safety    Ambulation/Gait Ambulation/Gait assistance: Contact guard assist Gait Distance (Feet): 25 Feet Assistive device: Rolling walker (2 wheels) Gait Pattern/deviations: Step-through pattern, Narrow base of support, Decreased stride length Gait velocity: decreased     General Gait Details: patient ambulated  to the bathroom and in the room with CGA for safety. occasional cues for safety with mobility and reinforcement of using rolling walker for safety  Stairs            Wheelchair Mobility     Tilt Bed    Modified Rankin (Stroke Patients Only)       Balance Overall balance assessment: Needs assistance Sitting-balance support: Feet supported Sitting balance-Leahy Scale: Fair     Standing balance support: No upper extremity supported, During functional activity Standing balance-Leahy Scale: Fair Standing balance comment: CGA to stand by assistance provided for standing to wash hands at the sink                             Pertinent Vitals/Pain Pain Assessment Pain Assessment: Faces Faces Pain Scale: Hurts little more Pain Location: posterior neck Pain Descriptors / Indicators: Discomfort Pain Intervention(s): Limited activity within patient's tolerance, Monitored during session, Repositioned    Home  Living Family/patient expects to be discharged to:: Private residence Living Arrangements: Alone Available Help at Discharge: Friend(s);Available PRN/intermittently;Neighbor Type of Home: Apartment Home Access: Elevator       Home Layout: One level Home Equipment: Shower seat  - built in;Grab bars - tub/shower;Rollator (4 wheels);Standard Walker;Cane - single point;Adaptive equipment Additional Comments: patient has difficulty describing DME, some information gathered from prior admission    Prior Function Prior Level of Function : History of Falls (last six months);Independent/Modified Independent             Mobility Comments: Mod I using 4 wheeled walker or standard walker. Limited walking since surgery due to fear of falling ADLs Comments: assistance for transportation     Extremity/Trunk Assessment   Upper Extremity Assessment Upper Extremity Assessment: Generalized weakness    Lower Extremity Assessment Lower Extremity Assessment: Generalized weakness       Communication   Communication Communication: No apparent difficulties  Cognition Arousal: Alert Behavior During Therapy: WFL for tasks assessed/performed Overall Cognitive Status: No family/caregiver present to determine baseline cognitive functioning                                 General Comments: Patient is a poor historian at times. She is able to follow single step commands with increased time. Mild confusion about recent events        General Comments      Exercises     Assessment/Plan    PT Assessment Patient needs continued PT services  PT Problem List Decreased strength;Decreased range of motion;Decreased activity tolerance;Decreased balance;Decreased mobility;Decreased safety awareness;Decreased knowledge of precautions       PT Treatment Interventions DME instruction;Gait training;Stair training;Functional mobility training;Therapeutic activities;Therapeutic exercise;Balance training;Neuromuscular re-education;Cognitive remediation;Patient/family education    PT Goals (Current goals can be found in the Care Plan section)  Acute Rehab PT Goals Patient Stated Goal: to see how she does and go home if possible PT Goal Formulation: With patient Time For  Goal Achievement: 05/02/23 Potential to Achieve Goals: Good    Frequency Min 1X/week     Co-evaluation PT/OT/SLP Co-Evaluation/Treatment: Yes Reason for Co-Treatment: Complexity of the patient's impairments (multi-system involvement) PT goals addressed during session: Mobility/safety with mobility         AM-PAC PT "6 Clicks" Mobility  Outcome Measure Help needed turning from your back to your side while in a flat bed without using bedrails?: A Little Help needed moving from lying on your back to sitting on the side of a flat bed without using bedrails?: A Little Help needed moving to and from a bed to a chair (including a wheelchair)?: A Lot Help needed standing up from a chair using your arms (e.g., wheelchair or bedside chair)?: A Little Help needed to walk in hospital room?: A Little Help needed climbing 3-5 steps with a railing? : A Lot 6 Click Score: 16    End of Session Equipment Utilized During Treatment: Gait belt Activity Tolerance: Patient tolerated treatment well Patient left: in chair;with call bell/phone within reach;with chair alarm set Nurse Communication: Mobility status PT Visit Diagnosis: Other abnormalities of gait and mobility (R26.89);Muscle weakness (generalized) (M62.81)    Time: 9563-8756 PT Time Calculation (min) (ACUTE ONLY): 21 min   Charges:   PT Evaluation $PT Eval Low Complexity: 1 Low   PT General Charges $$ ACUTE PT VISIT: 1 Visit         Donna Bernard, PT, MPT  In[employer] 04/18/2023, 10:23 AM

## 2023-04-18 NOTE — Progress Notes (Addendum)
Progress Note   Patient: Summer Murphy ZOX:[id number] DOB: 1956/01/02 DOA: 04/16/2023     1 DOS: the patient was seen and examined on 04/18/2023   Brief hospital course: Summer Murphy is a 68 y.o. female with medical history significant of type 2 diabetes, asthma, arthritis s/p cervical fusion and anxiety came to ED after having a fall, she was trying to reach her walker which slipped and she fell and unable to get out of the floor easily.  Patient had another fall 2 days ago when she slipped from her chair.  She was complaining of pain in her knees and neck.   Patient with no recent illnesses, no upper respiratory symptoms, no headache, had 2 episodes of diarrhea 2 days ago which has been resolved.  No nausea or vomiting.  Appetite and p.o. intake at baseline.  No urinary symptoms No recent travel or concern of tick bite.  On presentation she was febrile at 101.1 and tachycardic, labs with no leukocytosis, hemoglobin 10.7 which is at her baseline, mild hyponatremia with sodium at 132, albumin 2.5, CK29, lactic acid 1, magnesium 1.5, procalcitonin 0.40. UA and urine cultures are pending, Blood cultures were drawn.   Multiple imaging which include CT head, CT cervical spine, DG knee, DG hip and pelvis and CT scan of abdomen and pelvis was negative for any acute abnormality.  On exam she has a small indurated spot at the base of her cervical fusion incision with point tenderness, MRI was ordered and it shows some fluid collection, abscess cannot be ruled out.  1/25: Afebrile this morning, blood cultures growing Staph aureus-pending susceptibility.  Hemoglobin decreased to 7.3 without any obvious bleeding. Type and screen was done and it shows positive antibody-ordered 1 unit of PRBC and also consulted neurosurgery as there is no other obvious source of infection.  Started on cefazolin as there is no resistance detected. Patient now meets the criteria for sepsis with fever and tachycardia,  Staph aureus bacteremia.  Echocardiogram also ordered. Neurosurgery is going to take her to the OR for washout.  1/26: Preliminary postprocedural culture with no organism, anemia panel consistent with anemia of chronic disease and iron deficiency.  Started on supplement. Repeat blood cultures ordered.  Urine cultures with no growth  Assessment and Plan: * Sepsis (HCC) MSSA bacteremia. Patient met sepsis criteria with fever and tachycardia, blood cultures growing Staph aureus-pending susceptibility. MRI of cervical spine with concern of a fluid collection along posterior incision for recent cervical fusion injury, infection or abscess cannot be excluded.  Neurosurgery was consulted. Patient is going to the OR for washout, preliminary cultures with no organisms so far -Continue cefazolin per pharmacy -Echocardiogram-done with pending results -Consult ID -Follow neurosurgery recommendations  Falls Patient had 2 recent falls over the past 2 days, CK lower at 29. Both seems mechanical, door dizziness or presyncopal symptoms. Pan imaging without any bony abnormality or acute fractures. -PT and OT evaluation  Fever Now meeting criteria for sepsis.  T2DM (type 2 diabetes mellitus) (HCC) Patient uses Mounjaro at home. -SSI  Chronic pain syndrome History of arthritis. -Continue home Celebrex and Neurontin  Asthma No upper respiratory symptoms or concern of exacerbation. -Continue home inhalers  Restless leg -Continue home Requip  Anxiety and depression -Continue home BuSpar and Cymbalta  Migraine No acute concern. -Continue home Imitrex as needed  Acid reflux -Continue home PPI  Electrolyte abnormality Hyponatremia with mild worsening, sodium at 131, hypomagnesemia and hypokalemia resolved -Continue to monitor  Microcytic anemia  Patient with history of iron deficiency anemia, hemoglobin decreased to 7.3 this morning, no obvious bleeding. Type and screen positive for  antibodies -Checking iron and ferritin levels -1 unit of PRBC ordered -Starting on supplement -Monitor hemoglobin -Check FOBT   Subjective: Patient was feeling very weak.  Pain seems well-controlled.  Physical Exam: Vitals:   04/17/23 2122 04/18/23 0015 04/18/23 0433 04/18/23 0805  BP: 135/73 (!) 109/51 (!) 102/55 139/62  Pulse: 90 80 68 89  Resp: 18 20 19 18   Temp: 98.1 F (36.7 C) 98.6 F (37 C) 97.8 F (36.6 C) 98.5 F (36.9 C)  TempSrc: Oral Oral Oral Oral  SpO2: 98% 98% 98% 100%  Weight:      Height:       General.  Well-developed lady, in no acute distress.  Cervical drain in place. Pulmonary.  Lungs clear bilaterally, normal respiratory effort. CV.  Regular rate and rhythm, no JVD, rub or murmur. Abdomen.  Soft, nontender, nondistended, BS positive. CNS.  Alert and oriented .  No focal neurologic deficit. Extremities.  No edema, no cyanosis, pulses intact and symmetrical. Psychiatry.  Judgment and insight appears normal.   Data Reviewed: Prior data reviewed  Family Communication: Discussed with patient  Disposition: Status is: Inpatient Remains inpatient appropriate because: Severity of illness  Planned Discharge Destination: Home  DVT prophylaxis.  Lovenox Time spent: 45 minutes  This record has been created using Conservation officer, historic buildings. Errors have been sought and corrected,but may not always be located. Such creation errors do not reflect on the standard of care.   Author: Arnetha Courser, MD 04/18/2023 4:41 PM  For on call review [email or web address].

## 2023-04-19 ENCOUNTER — Inpatient Hospital Stay: Payer: 59

## 2023-04-19 DIAGNOSIS — T8463XA Infection and inflammatory reaction due to internal fixation device of spine, initial encounter: Secondary | ICD-10-CM | POA: Diagnosis not present

## 2023-04-19 DIAGNOSIS — A4101 Sepsis due to Methicillin susceptible Staphylococcus aureus: Secondary | ICD-10-CM | POA: Diagnosis not present

## 2023-04-19 DIAGNOSIS — R509 Fever, unspecified: Secondary | ICD-10-CM | POA: Diagnosis not present

## 2023-04-19 DIAGNOSIS — B9561 Methicillin susceptible Staphylococcus aureus infection as the cause of diseases classified elsewhere: Secondary | ICD-10-CM

## 2023-04-19 DIAGNOSIS — R296 Repeated falls: Secondary | ICD-10-CM | POA: Diagnosis not present

## 2023-04-19 DIAGNOSIS — T8149XA Infection following a procedure, other surgical site, initial encounter: Secondary | ICD-10-CM

## 2023-04-19 DIAGNOSIS — Z981 Arthrodesis status: Secondary | ICD-10-CM | POA: Diagnosis not present

## 2023-04-19 DIAGNOSIS — G2581 Restless legs syndrome: Secondary | ICD-10-CM | POA: Diagnosis not present

## 2023-04-19 DIAGNOSIS — R7881 Bacteremia: Secondary | ICD-10-CM | POA: Diagnosis not present

## 2023-04-19 HISTORY — DX: Methicillin susceptible Staphylococcus aureus infection as the cause of diseases classified elsewhere: B95.61

## 2023-04-19 LAB — ECHOCARDIOGRAM COMPLETE
AR max vel: 2.11 cm2
AV Peak grad: 6.3 mm[Hg]
Ao pk vel: 1.25 m/s
Area-P 1/2: 5.54 cm2
Calc EF: 54.8 %
Height: 63 in
MV M vel: 4.86 m/s
MV Peak grad: 94.6 mm[Hg]
S' Lateral: 4.3 cm
Single Plane A2C EF: 52.3 %
Single Plane A4C EF: 57 %
Weight: 2003.54 [oz_av]

## 2023-04-19 LAB — CULTURE, BLOOD (ROUTINE X 2)

## 2023-04-19 LAB — GLUCOSE, CAPILLARY
Glucose-Capillary: 107 mg/dL — ABNORMAL HIGH (ref 70–99)
Glucose-Capillary: 103 mg/dL — ABNORMAL HIGH (ref 70–99)
Glucose-Capillary: 110 mg/dL — ABNORMAL HIGH (ref 70–99)
Glucose-Capillary: 113 mg/dL — ABNORMAL HIGH (ref 70–99)

## 2023-04-19 MED ORDER — METHOCARBAMOL 500 MG PO TABS
500.0000 mg | ORAL_TABLET | Freq: Three times a day (TID) | ORAL | Status: DC | PRN
  Administered 2023-04-19 – 2023-04-21 (×5): 500 mg via ORAL
  Filled 2023-04-19 (×5): qty 1

## 2023-04-19 MED ORDER — LORAZEPAM 2 MG/ML IJ SOLN
1.0000 mg | Freq: Once | INTRAMUSCULAR | Status: AC
  Administered 2023-04-19: 1 mg via INTRAVENOUS
  Filled 2023-04-19: qty 1

## 2023-04-19 MED ORDER — OXYCODONE HCL 5 MG PO TABS
5.0000 mg | ORAL_TABLET | Freq: Once | ORAL | Status: AC
  Administered 2023-04-19: 5 mg via ORAL
  Filled 2023-04-19: qty 1

## 2023-04-19 NOTE — Plan of Care (Signed)
  Problem: Education: Goal: Knowledge of General Education information will improve Description: Including pain rating scale, medication(s)/side effects and non-pharmacologic comfort measures Outcome: Progressing   Problem: Health Behavior/Discharge Planning: Goal: Ability to manage health-related needs will improve Outcome: Progressing   Problem: Clinical Measurements: Goal: Will remain free from infection Outcome: Progressing   Problem: Activity: Goal: Risk for activity intolerance will decrease Outcome: Progressing   Problem: Coping: Goal: Level of anxiety will decrease Outcome: Progressing   Problem: Pain Managment: Goal: General experience of comfort will improve and/or be controlled Outcome: Progressing   Problem: Safety: Goal: Ability to remain free from injury will improve Outcome: Progressing   Problem: Skin Integrity: Goal: Risk for impaired skin integrity will decrease Outcome: Progressing

## 2023-04-19 NOTE — Assessment & Plan Note (Signed)
History of arthritis. -Continue home Celebrex and Neurontin

## 2023-04-19 NOTE — Progress Notes (Signed)
Mobility Specialist - Progress Note   04/19/23 0953  Mobility  Activity Ambulated with assistance to bathroom  Level of Assistance Contact guard assist, steadying assist  Assistive Device Front wheel walker  Distance Ambulated (ft) 24 ft  Activity Response Tolerated well  Mobility visit 1 Mobility  Mobility Specialist Start Time (ACUTE ONLY) 0930  Mobility Specialist Stop Time (ACUTE ONLY) 0941  Mobility Specialist Time Calculation (min) (ACUTE ONLY) 11 min   MS responding to call bell, Pt reported urinating in bed and requesting to amb to the bathroom. Pt completed bed mob modI, extra time required to bring trunk from sup to sit. Pt STS to RW MinA and amb to the bathroom CGA-MinG, reporting pain all over upon descending on commode. Pt sat on the commode while MS completed lien change. Pt STS to RW SBA and amb to bed CGA-MinG, left supine with alarm set and needs within reach.   Zetta Bills Mobility Specialist 04/19/23 10:29 AM

## 2023-04-19 NOTE — Assessment & Plan Note (Signed)
Patient had 2 recent falls over the past 2 days, CK lower at 29. Both seems mechanical, door dizziness or presyncopal symptoms. Pan imaging without any bony abnormality or acute fractures. MRI lumbar spine today with multilevel degenerative changes and no acute abnormality -PT and OT are recommending home health

## 2023-04-19 NOTE — Progress Notes (Signed)
Progress Note   Patient: Summer Murphy WJX:[id number] DOB: [birth year] DOA: 04/16/2023     2 DOS: the patient was seen and examined on 04/19/2023   Brief hospital course: KAMEKO HUKILL is a 68 y.o. female with medical history significant of type 2 diabetes, asthma, arthritis s/p cervical fusion and anxiety came to ED after having a fall, she was trying to reach her walker which slipped and she fell and unable to get out of the floor easily.  Patient had another fall 2 days ago when she slipped from her chair.  She was complaining of pain in her knees and neck.   Patient with no recent illnesses, no upper respiratory symptoms, no headache, had 2 episodes of diarrhea 2 days ago which has been resolved.  No nausea or vomiting.  Appetite and p.o. intake at baseline.  No urinary symptoms No recent travel or concern of tick bite.  On presentation she was febrile at 101.1 and tachycardic, labs with no leukocytosis, hemoglobin 10.7 which is at her baseline, mild hyponatremia with sodium at 132, albumin 2.5, CK29, lactic acid 1, magnesium 1.5, procalcitonin 0.40. UA and urine cultures are pending, Blood cultures were drawn.   Multiple imaging which include CT head, CT cervical spine, DG knee, DG hip and pelvis and CT scan of abdomen and pelvis was negative for any acute abnormality.  On exam she has a small indurated spot at the base of her cervical fusion incision with point tenderness, MRI was ordered and it shows some fluid collection, abscess cannot be ruled out.  1/25: Afebrile this morning, blood cultures growing Staph aureus-pending susceptibility.  Hemoglobin decreased to 7.3 without any obvious bleeding. Type and screen was done and it shows positive antibody-ordered 1 unit of PRBC and also consulted neurosurgery as there is no other obvious source of infection.  Started on cefazolin as there is no resistance detected. Patient now meets the criteria for sepsis with fever and tachycardia,  Staph aureus bacteremia.  Echocardiogram also ordered. Neurosurgery is going to take her to the OR for washout.  1/26: Preliminary postprocedural culture with no organism, anemia panel consistent with anemia of chronic disease and iron deficiency.  Started on supplement. Repeat blood cultures ordered.  Urine cultures with no growth  1/27: Repeat blood cultures with no growth in 24-hour, neurosurgery obtained lumbar MRI as she was complaining of lower back and bilateral leg pain, it was motion degraded study but did show multilevel degenerative changes and neural foraminal narrowing.  Drain was removed.  Cervical wound culture with some Staph aureus.  TEE with EF of 45 to 50%, some concern of regional wall motion abnormalities likely due to LBBB, discussed with cardiology and they are recommending outpatient follow-up.  Appointment was set with Dr. Mariah Milling in February. Pending final recommendations from ID  Assessment and Plan: * Sepsis (HCC) MSSA bacteremia. Patient met sepsis criteria with fever and tachycardia, blood cultures growing MSSA MRI of cervical spine with concern of a fluid collection along posterior incision for recent cervical fusion injury, infection or abscess cannot be excluded.  Neurosurgery was consulted. Patient is going to the OR for washout, preliminary cultures with Staph aureus-pending susceptibility Echocardiogram with EF of 40 to 45%, some concern of regional wall motion abnormalities which are likely due to LBBB, cardiology is recommending outpatient follow-up Repeat blood cultures done yesterday remain negative at 24 hours -Continue cefazolin per pharmacy -ID is on board  Falls Patient had 2 recent falls over the past 2 days,  CK lower at 29. Both seems mechanical, door dizziness or presyncopal symptoms. Pan imaging without any bony abnormality or acute fractures. MRI lumbar spine today with multilevel degenerative changes and no acute abnormality -PT and OT are  recommending home health  Fever Now meeting criteria for sepsis.  T2DM (type 2 diabetes mellitus) (HCC) Patient uses [employer] at home. -SSI  Chronic pain syndrome History of arthritis. -Continue home Celebrex and Neurontin  Asthma No upper respiratory symptoms or concern of exacerbation. -Continue home inhalers  Restless leg -Continue home Requip  Anxiety and depression -Continue home BuSpar and Cymbalta  Migraine No acute concern. -Continue home Imitrex as needed  Acid reflux -Continue home PPI  Electrolyte abnormality Hyponatremia with mild worsening, sodium at 131, hypomagnesemia and hypokalemia resolved -Continue to monitor  Microcytic anemia Patient with history of iron deficiency anemia, hemoglobin decreased to 7.3 s/p 1 unit of PRBC, currently hemoglobin stable at 8.4 -Starting on supplement -Monitor hemoglobin -Check FOBT   Subjective: Patient was complaining of lower back and bilateral leg pain.  Physical Exam: Vitals:   04/18/23 1945 04/19/23 0450 04/19/23 0740 04/19/23 0829  BP: (!) 147/69 (!) 140/82 (!) 141/88   Pulse: (!) 107 (!) 106 (!) 117 (!) 105  Resp: 18  18   Temp: (!) 97.3 F (36.3 C) 98.4 F (36.9 C) 98.4 F (36.9 C)   TempSrc:  Oral    SpO2: 98% 99% 100%   Weight:      Height:       General.  Well-developed lady, in no acute distress. Pulmonary.  Lungs clear bilaterally, normal respiratory effort. CV.  Regular rate and rhythm, no JVD, rub or murmur. Abdomen.  Soft, nontender, nondistended, BS positive. CNS.  Alert and oriented .  No focal neurologic deficit. Extremities.  No edema, no cyanosis, pulses intact and symmetrical.  Data Reviewed: Prior data reviewed  Family Communication: Discussed with patient  Disposition: Status is: Inpatient Remains inpatient appropriate because: Severity of illness  Planned Discharge Destination: Home with home health  DVT prophylaxis.  Lovenox Time spent: 44 minutes  This record has  been created using Conservation officer, historic buildings. Errors have been sought and corrected,but may not always be located. Such creation errors do not reflect on the standard of care.   Author: Arnetha Courser, MD 04/19/2023 3:46 PM  For on call review [email or web address].

## 2023-04-19 NOTE — Progress Notes (Signed)
   Neurosurgery Progress Note  History: Summer Murphy is s/p cervical wound debridement  POD2: Pt complaining of increased low back and bilateral radiating leg pain since yesterday. POD1: She states her neck pain is better this morning. She has had no other complaints.   Physical Exam: Vitals:   04/18/23 1945 04/19/23 0450  BP: (!) 147/69 (!) 140/82  Pulse: (!) 107 (!) 106  Resp: 18   Temp: (!) 97.3 F (36.3 C) 98.4 F (36.9 C)  SpO2: 98% 99%    AA Ox3 CNI  Strength:5/5 throughout  HV output 0 yesterday. Appears to be about 5-39mL in HV this morning  Data:  Other tests/results: see results reviewed  Assessment/Plan:  Summer Murphy is a 68 y.o s/p cervical posterior decompression and fusion about 6 weeks ago presenting after a fall. She was found to be febrile with concerns for sepsis. Blood cultures positive for MSSA bacteremia and MRI C spine showing fluid collection. She returned to the OR on 1/25 for wound washout.   -MRI L spine ordered given patients acute low back and leg pain to evaluate for possible fracture vs infection given presentation - mobilize - pain control - ok for DVT prophylaxis from neurosurgerical standpoint  - PTOT - will continue HV until output is 0 - will need staple removal around 14 days post-op. Our office will arrange   Manning Charity PA-C Department of Neurosurgery

## 2023-04-19 NOTE — Progress Notes (Signed)
Date of Admission:  04/16/2023     ID: Summer Murphy is a 68 y.o. female  Principal Problem:   Sepsis (HCC) Active Problems:   Migraine   Acid reflux   Restless leg   Microcytic anemia   Asthma   Anxiety and depression   T2DM (type 2 diabetes mellitus) (HCC)   Fever   Chronic pain syndrome   Falls   Electrolyte abnormality  Summer Murphy is a 68 y.o. with a history of cervical myelpathy for which she  underwent c3-c6 laminectomy and posterior fusion on 03/02/23, COPD, barrettts esophagus, hiatus hernia, HTN, followed by neurosurgery as OP and last seen by them on 04/12/23 with findings of overall wound healing well presented to the ED thru EMS from home on 04/16/23 after fall at home- as per the chart she was trying to reach for ehr walker and slipped and fell and could not get off the floor- 2 days prior to that she had slipped from her chair- she did not c/o fever or chills She had neck pain which was present since before surgery and not changed in intensity recently  Subjective: Pt c/o pain legs Friend at bed side  Medications:   busPIRone  10 mg Oral BID   celecoxib  100 mg Oral BID   DULoxetine  60 mg Oral Daily   enoxaparin (LOVENOX) injection  40 mg Subcutaneous Q24H   Fe Fum-Vit C-Vit B12-FA  1 capsule Oral BID   fluticasone furoate-vilanterol  1 puff Inhalation Daily   gabapentin  900 mg Oral BID   insulin aspart  0-15 Units Subcutaneous TID WC   insulin aspart  0-5 Units Subcutaneous QHS   LORazepam  1 mg Intravenous Once   montelukast  10 mg Oral QHS   pantoprazole  20 mg Oral Daily   rOPINIRole  1 mg Oral QHS   sodium chloride flush  3 mL Intravenous Q12H   traZODone  100 mg Oral QHS    Objective: Vital signs in last 24 hours: Patient Vitals for the past 24 hrs:  BP Temp Temp src Pulse Resp SpO2  04/19/23 0829 -- -- -- (!) 105 -- --  04/19/23 0740 (!) 141/88 98.4 F (36.9 C) -- (!) 117 18 100 %  04/19/23 0450 (!) 140/82 98.4 F (36.9 C) Oral (!)  106 -- 99 %  04/18/23 1945 (!) 147/69 (!) 97.3 F (36.3 C) -- (!) 107 18 98 %  04/18/23 1619 137/75 (!) 97.3 F (36.3 C) -- 99 -- 97 %     LDA Foley Central lines Other catheters  PHYSICAL EXAM:  General: Alert, cooperative, chronically ill, myoclonic movts of tongue and mouth Head: Normocephalic, without obvious abnormality, atraumatic. Eyes: Conjunctivae clear, anicteric sclerae. Pupils are equal ENT Nares normal. No drainage or sinus tenderness. Neck: cervical area- surgical site covered with dressing- drain present Lungs: b/l air entry Heart: Regular rate and rhythm, no murmur, rub or gallop. Abdomen: Soft, non-tender,not distended. Bowel sounds normal. No masses Extremities: atraumatic, no cyanosis. No edema. No clubbing Skin: No rashes or lesions. Or bruising Lymph: Cervical, supraclavicular normal. Neurologic: Grossly non-focal  Lab Results    Latest Ref Rng & Units 04/18/2023    6:53 AM 04/17/2023    6:29 PM 04/17/2023    5:06 AM  CBC  WBC 4.0 - 10.5 K/uL 11.6   9.9   Hemoglobin 12.0 - 15.0 g/dL 8.4  8.9  7.3   Hematocrit 36.0 - 46.0 % 26.0  27.1  22.2   Platelets 150 - 400 K/uL 323   392        Latest Ref Rng & Units 04/18/2023    6:53 AM 04/17/2023    3:38 AM 04/16/2023   12:22 PM  CMP  Glucose 70 - 99 mg/dL 253  664  403   BUN 8 - 23 mg/dL 24  16  30    Creatinine 0.44 - 1.00 mg/dL 4.74  2.59  5.63   Sodium 135 - 145 mmol/L 131  134  132   Potassium 3.5 - 5.1 mmol/L 4.8  3.4  3.5   Chloride 98 - 111 mmol/L 102  102  93   CO2 22 - 32 mmol/L 22  23  23    Calcium 8.9 - 10.3 mg/dL 8.0  7.8  8.4   Total Protein 6.5 - 8.1 g/dL   6.4   Total Bilirubin 0.0 - 1.2 mg/dL   0.6   Alkaline Phos 38 - 126 U/L   92   AST 15 - 41 U/L   16   ALT 0 - 44 U/L   14       Microbiology:  Studies/Results: ECHOCARDIOGRAM COMPLETE Result Date: 04/19/2023    ECHOCARDIOGRAM REPORT   Patient Name:   Summer Murphy Date of Exam: 04/18/2023 Medical Rec #:  [id number]          Height:       63.0 in Accession #:    [id number]        Weight:       125.2 lb Date of Birth:  01/01/1956         BSA:          1.585 m Patient Age:    67 years          BP:           139/62 mmHg Patient Gender: F                 HR:           95 bpm. Exam Location:  [facility] Procedure: 2D Echo, Cardiac Doppler and Color Doppler Indications:     Bacteremia R78.81  History:         Patient has prior history of Echocardiogram examinations, most                  recent 04/21/2021. Migraine and COPD, Signs/Symptoms:Dyspnea and                  Shortness of Breath; Risk Factors:Diabetes.  Sonographer:     Lucendia Herrlich RCS Referring Phys:  [id number] XNATFTD AMIN Diagnosing Phys: Chilton Si MD IMPRESSIONS  1. Left ventricular ejection fraction, by estimation, is 45 to 50%. The left ventricle has mildly decreased function. The left ventricle demonstrates regional wall motion abnormalities (see scoring diagram/findings for description). Left ventricular diastolic parameters are consistent with Grade I diastolic dysfunction (impaired relaxation).  2. Right ventricular systolic function is normal. The right ventricular size is normal. There is moderately elevated pulmonary artery systolic pressure.  3. Left atrial size was severely dilated.  4. The mitral valve is normal in structure. Mild to moderate mitral valve regurgitation. No evidence of mitral stenosis.  5. Tricuspid valve regurgitation is mild to moderate.  6. The aortic valve is tricuspid. Aortic valve regurgitation is not visualized. No aortic stenosis is present.  7. The inferior vena cava is dilated in size with <50% respiratory variability, suggesting right atrial pressure of 15  mmHg. FINDINGS  Left Ventricle: Left ventricular ejection fraction, by estimation, is 45 to 50%. The left ventricle has mildly decreased function. The left ventricle demonstrates regional wall motion abnormalities. The left ventricular internal cavity size was normal in size. There is  no left ventricular hypertrophy. Left ventricular diastolic parameters are consistent with Grade I diastolic dysfunction (impaired relaxation). Indeterminate filling pressures.  LV Wall Scoring: The entire inferior wall, mid inferoseptal segment, and basal inferoseptal segment are hypokinetic. The entire anterior wall, entire lateral wall, entire anterior septum, and apex are normal. Right Ventricle: The right ventricular size is normal. No increase in right ventricular wall thickness. Right ventricular systolic function is normal. There is moderately elevated pulmonary artery systolic pressure. The tricuspid regurgitant velocity is 2.91 m/s, and with an assumed right atrial pressure of 15 mmHg, the estimated right ventricular systolic pressure is 48.9 mmHg. Left Atrium: Left atrial size was severely dilated. Right Atrium: Right atrial size was normal in size. Pericardium: There is no evidence of pericardial effusion. Mitral Valve: The mitral valve is normal in structure. Mild to moderate mitral valve regurgitation. No evidence of mitral valve stenosis. Tricuspid Valve: The tricuspid valve is normal in structure. Tricuspid valve regurgitation is mild to moderate. No evidence of tricuspid stenosis. Aortic Valve: The aortic valve is tricuspid. Aortic valve regurgitation is not visualized. No aortic stenosis is present. Aortic valve peak gradient measures 6.2 mmHg. Pulmonic Valve: The pulmonic valve was normal in structure. Pulmonic valve regurgitation is not visualized. No evidence of pulmonic stenosis. Aorta: The aortic root is normal in size and structure. Venous: The inferior vena cava is dilated in size with less than 50% respiratory variability, suggesting right atrial pressure of 15 mmHg. IAS/Shunts: No atrial level shunt detected by color flow Doppler.  LEFT VENTRICLE PLAX 2D LVIDd:         5.00 cm      Diastology LVIDs:         4.30 cm      LV e' medial:    8.27 cm/s LV PW:         0.80 cm      LV E/e' medial:   10.1 LV IVS:        0.70 cm      LV e' lateral:   12.30 cm/s LVOT diam:     2.00 cm      LV E/e' lateral: 6.8 LV SV:         51 LV SV Index:   32 LVOT Area:     3.14 cm  LV Volumes (MOD) LV vol d, MOD A2C: 138.0 ml LV vol d, MOD A4C: 99.1 ml LV vol s, MOD A2C: 65.8 ml LV vol s, MOD A4C: 42.6 ml LV SV MOD A2C:     72.2 ml LV SV MOD A4C:     99.1 ml LV SV MOD BP:      65.0 ml RIGHT VENTRICLE             IVC RV S prime:     13.50 cm/s  IVC diam: 2.20 cm TAPSE (M-mode): 2.4 cm LEFT ATRIUM             Index        RIGHT ATRIUM           Index LA diam:        4.30 cm 2.71 cm/m   RA Area:     13.10 cm LA Vol (A2C):   58.2 ml 36.73 ml/m  RA Volume:   30.50 ml  19.25 ml/m LA Vol (A4C):   77.1 ml 48.65 ml/m LA Biplane Vol: 68.8 ml 43.41 ml/m  AORTIC VALVE AV Area (Vmax): 2.11 cm AV Vmax:        125.00 cm/s AV Peak Grad:   6.2 mmHg LVOT Vmax:      83.85 cm/s LVOT Vmean:     55.000 cm/s LVOT VTI:       0.161 m  AORTA Ao Root diam: 2.90 cm Ao Asc diam:  2.90 cm MITRAL VALVE                TRICUSPID VALVE MV Area (PHT): 5.54 cm     TR Peak grad:   33.9 mmHg MV Decel Time: 137 msec     TR Vmax:        291.00 cm/s MR Peak grad: 94.6 mmHg MR Vmax:      486.33 cm/s   SHUNTS MV E velocity: 83.30 cm/s   Systemic VTI:  0.16 m MV A velocity: 121.00 cm/s  Systemic Diam: 2.00 cm MV E/A ratio:  0.69 Chilton Si MD Electronically signed by Chilton Si MD Signature Date/Time: 04/19/2023/7:52:18 AM    Final      Assessment/Plan: MSSA bacteremia  due to cervical spine surgical site infection S/p I/D - culture staph aureues Continue cefazolin Repeat blood culture after 48 hrs of appropriate antibiotic TEE will not change the management or duration as she will need 6 weeks of IV antibiotic followed by PO for another 6 weeks      Recent c3-c6 laminectomy and posterior fusion MRI showed superficial and deep collection- underwent I/D and culture has been sent Continue cefazolin   Anemia   Hiatus hernia h/o  fundoplication Dysphagia has had esophageal dilatation Nov 2024 Biopsy no evidence of metaplasia ? Anxiety  on meds Discussed the management with patient and her friend at bed side

## 2023-04-19 NOTE — Progress Notes (Signed)
Physical Therapy Treatment Patient Details Name: Summer Murphy MRN: [id number] DOB: [birth year] Today's Date: 04/19/2023   History of Present Illness Patient is a 68 year old female with sepsis, wound infection, s/p cervical wound debridement. History of recent fall, C3-C6 laminectomy and posterior fusion 03/02/23, COPD, barrettts esophagus, hiatus hernia, HTN.    PT Comments  Patient alert, reported 9/10 low back, groin, and hip pain bilaterally, RN in room and aware and stated she had informed the MD as well. Pt/friend in room mentioned that the pt had urinated in the bed, session focused on ensuring cleanliness. She was able to mobilize in/out of bed with CGA, extra time and cues to remember to utilize log roll technique. Sit <> stand with RW and CGA (a bit impulsively). She attempted to begin to ambulate to bathroom but pt family reminded pt she had already went in the bed. standing pericare with washcloth, CGA. pt declined walking after, returned to bed with needs in reach. The patient would benefit from further skilled PT intervention to continue to progress towards goals.     If plan is discharge home, recommend the following: A little help with walking and/or transfers;A little help with bathing/dressing/bathroom;Assist for transportation;Assistance with cooking/housework   Can travel by private vehicle        Equipment Recommendations  None recommended by PT    Recommendations for Other Services       Precautions / Restrictions Precautions Precautions: Fall Precaution Comments: hemovac, BLTs Restrictions Weight Bearing Restrictions Per Provider Order: No     Mobility  Bed Mobility Overal bed mobility: Needs Assistance Bed Mobility: Supine to Sit, Sit to Supine     Supine to sit: Contact guard Sit to supine: Contact guard assist, Used rails   General bed mobility comments: educated on log roll tehcnique with some follow through    Transfers Overall transfer level:  Needs assistance Equipment used: Rolling walker (2 wheels) Transfers: Sit to/from Stand Sit to Stand: Contact guard assist           General transfer comment: able to stand and does so a bit impulsively    Ambulation/Gait   Gait Distance (Feet): 5 Feet Assistive device: Rolling walker (2 wheels)         General Gait Details: attempted to begin to ambulate to bathroom but pt family reminded pt she had already went in the bed. standing pericare with washcloth, CGA. pt declined walking after   Stairs             Wheelchair Mobility     Tilt Bed    Modified Rankin (Stroke Patients Only)       Balance Overall balance assessment: Needs assistance Sitting-balance support: Feet supported Sitting balance-Leahy Scale: Fair       Standing balance-Leahy Scale: Good                              Cognition Arousal: Alert Behavior During Therapy: WFL for tasks assessed/performed                                   General Comments: Patient is a poor historian at times. She is able to follow single step commands with increased time. Mild confusion about recent events        Exercises      General Comments  Pertinent Vitals/Pain Pain Assessment Pain Score: 9  Pain Location: low back, hips, groins Pain Descriptors / Indicators: Discomfort, Grimacing, Moaning Pain Intervention(s): Limited activity within patient's tolerance, Monitored during session, Repositioned    Home Living                          Prior Function            PT Goals (current goals can now be found in the care plan section) Progress towards PT goals: Progressing toward goals    Frequency    Min 1X/week      PT Plan      Co-evaluation              AM-PAC PT "6 Clicks" Mobility   Outcome Measure  Help needed turning from your back to your side while in a flat bed without using bedrails?: A Little Help needed moving from  lying on your back to sitting on the side of a flat bed without using bedrails?: A Little Help needed moving to and from a bed to a chair (including a wheelchair)?: A Little Help needed standing up from a chair using your arms (e.g., wheelchair or bedside chair)?: A Little Help needed to walk in hospital room?: A Little Help needed climbing 3-5 steps with a railing? : A Lot 6 Click Score: 17    End of Session Equipment Utilized During Treatment: Gait belt Activity Tolerance: Patient tolerated treatment well Patient left: with call bell/phone within reach;in bed;with bed alarm set Nurse Communication: Mobility status PT Visit Diagnosis: Other abnormalities of gait and mobility (R26.89);Muscle weakness (generalized) (M62.81)     Time: 1610-9604 PT Time Calculation (min) (ACUTE ONLY): 13 min  Charges:    $Therapeutic Activity: 8-22 mins PT General Charges $$ ACUTE PT VISIT: 1 Visit                     Olga Coaster PT, DPT 3:55 PM,04/19/23

## 2023-04-19 NOTE — Assessment & Plan Note (Signed)
Patient with history of iron deficiency anemia, hemoglobin decreased to 7.3 s/p 1 unit of PRBC, currently hemoglobin stable at 8.4 -Starting on supplement -Monitor hemoglobin -Check FOBT

## 2023-04-20 ENCOUNTER — Other Ambulatory Visit: Payer: Self-pay

## 2023-04-20 DIAGNOSIS — R296 Repeated falls: Secondary | ICD-10-CM | POA: Diagnosis not present

## 2023-04-20 DIAGNOSIS — B9561 Methicillin susceptible Staphylococcus aureus infection as the cause of diseases classified elsewhere: Secondary | ICD-10-CM

## 2023-04-20 DIAGNOSIS — R509 Fever, unspecified: Secondary | ICD-10-CM | POA: Diagnosis not present

## 2023-04-20 DIAGNOSIS — R7881 Bacteremia: Secondary | ICD-10-CM | POA: Diagnosis not present

## 2023-04-20 DIAGNOSIS — T8149XA Infection following a procedure, other surgical site, initial encounter: Secondary | ICD-10-CM | POA: Diagnosis not present

## 2023-04-20 DIAGNOSIS — A4101 Sepsis due to Methicillin susceptible Staphylococcus aureus: Secondary | ICD-10-CM | POA: Diagnosis not present

## 2023-04-20 LAB — CBC
HCT: 27.6 % — ABNORMAL LOW (ref 36.0–46.0)
Hemoglobin: 9 g/dL — ABNORMAL LOW (ref 12.0–15.0)
MCH: 23 pg — ABNORMAL LOW (ref 26.0–34.0)
MCHC: 32.6 g/dL (ref 30.0–36.0)
MCV: 70.6 fL — ABNORMAL LOW (ref 80.0–100.0)
Platelets: 432 10*3/uL — ABNORMAL HIGH (ref 150–400)
RBC: 3.91 MIL/uL (ref 3.87–5.11)
RDW: 23.7 % — ABNORMAL HIGH (ref 11.5–15.5)
WBC: 14.7 10*3/uL — ABNORMAL HIGH (ref 4.0–10.5)
nRBC: 0 % (ref 0.0–0.2)

## 2023-04-20 LAB — GLUCOSE, CAPILLARY
Glucose-Capillary: 93 mg/dL (ref 70–99)
Glucose-Capillary: 101 mg/dL — ABNORMAL HIGH (ref 70–99)
Glucose-Capillary: 92 mg/dL (ref 70–99)
Glucose-Capillary: 101 mg/dL — ABNORMAL HIGH (ref 70–99)

## 2023-04-20 LAB — AEROBIC CULTURE W GRAM STAIN (SUPERFICIAL SPECIMEN)

## 2023-04-20 LAB — BASIC METABOLIC PANEL
Anion gap: 11 (ref 5–15)
BUN: 11 mg/dL (ref 8–23)
CO2: 25 mmol/L (ref 22–32)
Calcium: 7.9 mg/dL — ABNORMAL LOW (ref 8.9–10.3)
Chloride: 98 mmol/L (ref 98–111)
Creatinine, Ser: 0.38 mg/dL — ABNORMAL LOW (ref 0.44–1.00)
GFR, Estimated: >60 mL/min (ref >60)
Glucose, Bld: 102 mg/dL — ABNORMAL HIGH (ref 70–99)
Potassium: 4.9 mmol/L (ref 3.5–5.1)
Sodium: 134 mmol/L — ABNORMAL LOW (ref 135–145)

## 2023-04-20 MED ORDER — HYDROCODONE-ACETAMINOPHEN 5-325 MG PO TABS
1.0000 | ORAL_TABLET | Freq: Four times a day (QID) | ORAL | Status: DC | PRN
  Administered 2023-04-20: 1 via ORAL
  Filled 2023-04-20 (×2): qty 1

## 2023-04-20 MED ORDER — SODIUM CHLORIDE 0.9% FLUSH: 10.0000 mL | INTRAVENOUS | Status: DC | PRN

## 2023-04-20 MED ORDER — CHLORHEXIDINE GLUCONATE CLOTH 2 % EX PADS
6.0000 | MEDICATED_PAD | Freq: Every day | CUTANEOUS | Status: DC
  Administered 2023-04-20 – 2023-04-28 (×9): 6 via TOPICAL

## 2023-04-20 MED ORDER — SODIUM CHLORIDE 0.9% FLUSH
10.0000 mL | Freq: Two times a day (BID) | INTRAVENOUS | Status: DC
Start: 2023-04-20 — End: 2023-04-27
  Administered 2023-04-20 – 2023-04-24 (×9): 10 mL
  Administered 2023-04-25: 30 mL
  Administered 2023-04-26: 10 mL

## 2023-04-20 NOTE — Progress Notes (Signed)
   Neurosurgery Progress Note  History: Summer Murphy is s/p cervical wound debridement  POD3: Patient denies any neck pain this morning. She continues to complain of back pain. POD2: Pt complaining of increased low back and bilateral radiating leg pain since yesterday. POD1: She states her neck pain is better this morning. She has had no other complaints.   Physical Exam: Vitals:   04/20/23 0413 04/20/23 0751  BP: (!) 150/77 (!) 146/87  Pulse: (!) 122 (!) 126  Resp: 18 16  Temp: 99.4 F (37.4 C) 98 F (36.7 C)  SpO2: 94% 96%    AA Ox3 CNI  Strength:5/5 throughout  HV with very little output.  Data:  MRI Lumbar spine 04/19/23: IMPRESSION: 1. Severely motion degraded study. Repeat study when clinically appropriate suggested. 2. Multilevel degenerative changes of the lumbar spine with likely moderate spinal canal stenosis at L2-3 and L4-5. 3. Multilevel neural foraminal narrowing, severe on the left at L5-S1.       Assessment/Plan:  Summer Murphy is a 68 y.o s/p cervical posterior decompression and fusion about 6 weeks ago presenting after a fall. She was found to be febrile with concerns for sepsis. Blood cultures positive for MSSA bacteremia and MRI C spine showing fluid collection. She returned to the OR on 1/25 for wound washout.   -Results of MRI Lumbar spine yesterday show degenerative changes and foraminal stenosis, but no evidence of infection or fracture - mobilize - pain control - ok for DVT prophylaxis from neurosurgerical standpoint  - PTOT - will continue HV until output is 0 - will need staple removal around 14 days post-op. Appointment has been made.  Joan Flores PA-C Department of Neurosurgery

## 2023-04-20 NOTE — Plan of Care (Signed)
  Problem: Nutrition: Goal: Adequate nutrition will be maintained Outcome: Progressing   Problem: Elimination: Goal: Will not experience complications related to urinary retention Outcome: Progressing   Problem: Safety: Goal: Ability to remain free from injury will improve Outcome: Progressing   Problem: Skin Integrity: Goal: Risk for impaired skin integrity will decrease Outcome: Progressing   Problem: Coping: Goal: Level of anxiety will decrease Outcome: Not Progressing   Problem: Pain Managment: Goal: General experience of comfort will improve and/or be controlled Outcome: Not Progressing

## 2023-04-20 NOTE — Care Management Important Message (Signed)
Important Message  Patient Details  Name: Summer Murphy MRN: [id number] Date of Birth: 08-18-1955   Important Message Given:  Yes - Medicare IM     Cristela Blue, CMA 04/20/2023, 10:25 AM

## 2023-04-20 NOTE — Progress Notes (Signed)
Mobility Specialist - Progress Note     04/20/23 0946  Mobility  Activity Ambulated with assistance in room;Stood at bedside  Level of Assistance Contact guard assist, steadying assist  Assistive Device Front wheel walker  Distance Ambulated (ft) 10 ft  Range of Motion/Exercises Active  Activity Response Tolerated well  Mobility Referral Yes  Mobility visit 1 Mobility  Mobility Specialist Start Time (ACUTE ONLY) 0935  Mobility Specialist Stop Time (ACUTE ONLY) 0948  Mobility Specialist Time Calculation (min) (ACUTE ONLY) 13 min   Pt resting in bed on RA upon entry. Pt STS and ambulates to hallway CGA with RW in room. Pt made it half way to door before turning around due to pain. Pt returned to bed and left with needs in reach, bed alarm activated.   Johnathan Hausen Mobility Specialist 04/20/23, 9:50 AM

## 2023-04-20 NOTE — Consult Note (Signed)
VAT: Consult for 2nd PIV  Secure chat sent to bedside RN. Pt has been assessed by 2 IV team RN's and it was determined the pt has no suitable veins for venipuncture. Pt may be better suited for central line placement.  Consult complete.

## 2023-04-20 NOTE — Progress Notes (Signed)
Date of Admission:  04/16/2023     ID: CRYSTALEE VENTRESS is a 68 y.o. female  Principal Problem:   Sepsis (HCC) Active Problems:   Migraine   Acid reflux   Restless leg   Microcytic anemia   Asthma   Anxiety and depression   T2DM (type 2 diabetes mellitus) (HCC)   Fever   Chronic pain syndrome   Falls   Electrolyte abnormality   MSSA bacteremia   Surgical site infection  JAYLEA PLOURDE is a 68 y.o. with a history of cervical myelpathy for which she  underwent c3-c6 laminectomy and posterior fusion on 03/02/23, COPD, barrettts esophagus, hiatus hernia, HTN, followed by neurosurgery as OP and last seen by them on 04/12/23 with findings of overall wound healing well presented to the ED thru EMS from home on 04/16/23 after fall at home- as per the chart she was trying to reach for ehr walker and slipped and fell and could not get off the floor- 2 days prior to that she had slipped from her chair- she did not c/o fever or chills She had neck pain which was present since before surgery and not changed in intensity recently  Subjective: Pt doing better  Medications:   busPIRone  10 mg Oral BID   celecoxib  100 mg Oral BID   Chlorhexidine Gluconate Cloth  6 each Topical Daily   DULoxetine  60 mg Oral Daily   enoxaparin (LOVENOX) injection  40 mg Subcutaneous Q24H   Fe Fum-Vit C-Vit B12-FA  1 capsule Oral BID   fluticasone furoate-vilanterol  1 puff Inhalation Daily   gabapentin  900 mg Oral BID   insulin aspart  0-15 Units Subcutaneous TID WC   insulin aspart  0-5 Units Subcutaneous QHS   montelukast  10 mg Oral QHS   pantoprazole  20 mg Oral Daily   rOPINIRole  1 mg Oral QHS   sodium chloride flush  10-40 mL Intracatheter Q12H   sodium chloride flush  3 mL Intravenous Q12H   traZODone  100 mg Oral QHS    Objective: Vital signs in last 24 hours: Patient Vitals for the past 24 hrs:  BP Temp Temp src Pulse Resp SpO2  04/20/23 2025 (!) 144/70 98.2 F (36.8 C) -- 100 -- 98 %   04/20/23 1551 133/72 98 F (36.7 C) -- (!) 109 18 97 %  04/20/23 1155 (!) 152/76 98.4 F (36.9 C) -- (!) 113 18 96 %  04/20/23 0751 (!) 146/87 98 F (36.7 C) -- (!) 126 16 96 %  04/20/23 0413 (!) 150/77 99.4 F (37.4 C) Oral (!) 122 18 94 %  04/19/23 2210 (!) 143/81 98 F (36.7 C) -- (!) 119 18 97 %      PHYSICAL EXAM:  General: Alert, cooperative, chronically ill,  Neck: cervical area- surgical site covered with dressing- drain present Lungs: b/l air entry Heart: Regular rate and rhythm, no murmur, rub or gallop. Abdomen: Soft, non-tender,not distended. Bowel sounds normal. No masses Extremities: atraumatic, no cyanosis. No edema. No clubbing Skin: No rashes or lesions. Or bruising Lymph: Cervical, supraclavicular normal. Neurologic: Grossly non-focal  Lab Results    Latest Ref Rng & Units 04/20/2023    5:01 AM 04/18/2023    6:53 AM 04/17/2023    6:29 PM  CBC  WBC 4.0 - 10.5 K/uL 14.7  11.6    Hemoglobin 12.0 - 15.0 g/dL 9.0  8.4  8.9   Hematocrit 36.0 - 46.0 % 27.6  26.0  27.1   Platelets 150 - 400 K/uL 432  323         Latest Ref Rng & Units 04/20/2023    5:01 AM 04/18/2023    6:53 AM 04/17/2023    3:38 AM  CMP  Glucose 70 - 99 mg/dL 161  096  045   BUN 8 - 23 mg/dL 11  24  16    Creatinine 0.44 - 1.00 mg/dL 4.09  8.11  9.14   Sodium 135 - 145 mmol/L 134  131  134   Potassium 3.5 - 5.1 mmol/L 4.9  4.8  3.4   Chloride 98 - 111 mmol/L 98  102  102   CO2 22 - 32 mmol/L 25  22  23    Calcium 8.9 - 10.3 mg/dL 7.9  8.0  7.8       Microbiology:  Studies/Results: [place] EKG SITE RITE Result Date: 04/20/2023 If Site Rite image not attached, placement could not be confirmed due to current cardiac rhythm.  MR LUMBAR SPINE WO CONTRAST Result Date: 04/19/2023 CLINICAL DATA:  Lumbar radiculopathy, prior surgery, new symptoms. EXAM: MRI LUMBAR SPINE WITHOUT CONTRAST TECHNIQUE: Multiplanar, multisequence MR imaging of the lumbar spine was performed. No intravenous contrast was  administered. COMPARISON:  MRI of the lumbar spine November 05, 2005. FINDINGS: The study is severely degraded by motion. Segmentation: 5 lumbar type vertebrae assumed. Alignment:  Small retrolisthesis at L1-2 and L4-5. Vertebrae: STIR images suggest endplate marrow edema at L3-4. However, these images are severely degraded by motion. T12-L1 appear chronically fused. No fracture with significant height loss of retropulsion. Conus medullaris and cauda equina: Conus extends to the L1-2 level. Paraspinal and other soft tissues: Grossly unremarkable. Disc levels: T12-L1: Fused level. No high-grade spinal canal or neural foraminal stenosis. L1-2: Disc bulge and facet degenerative changes with mild bilateral neural foraminal narrowing. No high-grade spinal canal stenosis. L2-3: Disc bulge and facet degenerative changes results in likely moderate spinal canal stenosis and mild-to-moderate bilateral neural foraminal narrowing. L3-4: Likely mild to moderate spinal canal stenosis and mild bilateral neural foraminal narrowing. L4-5: Likely moderate spinal canal stenosis and moderate bilateral neural foraminal narrowing. L5-S1: There appears to be severe left and moderate right neural foraminal narrowing. No spinal canal stenosis. IMPRESSION: 1. Severely motion degraded study. Repeat study when clinically appropriate suggested. 2. Multilevel degenerative changes of the lumbar spine with likely moderate spinal canal stenosis at L2-3 and L4-5. 3. Multilevel neural foraminal narrowing, severe on the left at L5-S1. Electronically Signed   By: Baldemar Lenis M.D.   On: 04/19/2023 15:27     Assessment/Plan: MSSA bacteremia  due to cervical spine surgical site infection S/p I/D - culture staph aureues Continue cefazolin Repeat blood culture from 1/26 neg so far TEE will not change the management or duration as she will need 6 weeks of IV antibiotic followed by PO for another 6 weeks      Recent c3-c6  laminectomy and posterior fusion MRI showed superficial and deep collection- underwent I/D and culture is MSSA Continue cefazolin for 6 weeks followed by PO for 6 more weeks   Anemia   Hiatus hernia h/o fundoplication Dysphagia has had esophageal dilatation Nov 2024 Biopsy no evidence of metaplasia ? Anxiety  on meds Discussed the management with patient

## 2023-04-20 NOTE — Progress Notes (Addendum)
Progress Note   Patient: Summer Murphy HYQ:[id number] DOB: Jul 06, 1955 DOA: 04/16/2023     3 DOS: the patient was seen and examined on 04/20/2023   Brief hospital course: Summer Murphy is a 68 y.o. female with medical history significant of type 2 diabetes, asthma, arthritis s/p cervical fusion and anxiety came to ED after having a fall, she was trying to reach her walker which slipped and she fell and unable to get out of the floor easily.  Patient had another fall 2 days ago when she slipped from her chair.  She was complaining of pain in her knees and neck.   Patient with no recent illnesses, no upper respiratory symptoms, no headache, had 2 episodes of diarrhea 2 days ago which has been resolved.  No nausea or vomiting.  Appetite and p.o. intake at baseline.  No urinary symptoms No recent travel or concern of tick bite.  On presentation she was febrile at 101.1 and tachycardic, labs with no leukocytosis, hemoglobin 10.7 which is at her baseline, mild hyponatremia with sodium at 132, albumin 2.5, CK29, lactic acid 1, magnesium 1.5, procalcitonin 0.40. UA and urine cultures are pending, Blood cultures were drawn.   Multiple imaging which include CT head, CT cervical spine, DG knee, DG hip and pelvis and CT scan of abdomen and pelvis was negative for any acute abnormality.  On exam she has a small indurated spot at the base of her cervical fusion incision with point tenderness, MRI was ordered and it shows some fluid collection, abscess cannot be ruled out.  1/25: Afebrile this morning, blood cultures growing Staph aureus-pending susceptibility.  Hemoglobin decreased to 7.3 without any obvious bleeding. Type and screen was done and it shows positive antibody-ordered 1 unit of PRBC and also consulted neurosurgery as there is no other obvious source of infection.  Started on cefazolin as there is no resistance detected. Patient now meets the criteria for sepsis with fever and tachycardia,  Staph aureus bacteremia.  Echocardiogram also ordered. Neurosurgery is going to take her to the OR for washout.  1/26: Preliminary postprocedural culture with no organism, anemia panel consistent with anemia of chronic disease and iron deficiency.  Started on supplement. Repeat blood cultures ordered.  Urine cultures with no growth  1/27: Repeat blood cultures with no growth in 24-hour, neurosurgery obtained lumbar MRI as she was complaining of lower back and bilateral leg pain, it was motion degraded study but did show multilevel degenerative changes and neural foraminal narrowing. Cervical wound culture with some Staph aureus.  TTE with EF of 45 to 50%, some concern of regional wall motion abnormalities likely due to LBBB, discussed with cardiology and they are recommending outpatient follow-up.  Appointment was set with Dr. Mariah Milling in February. Pending final recommendations from ID.  1/28: Hemodynamically stable, continue to have lower back and leg pain.  Cervical drain remain in place.  ID is recommending at least 6 weeks of IV cefazolin followed by another 4 to 6 weeks of p.o. antibiotics.  PICC line was placed today.  Repeat blood cultures negative for 48 hours.  Assessment and Plan: * Sepsis (HCC) MSSA bacteremia. Patient met sepsis criteria with fever and tachycardia, blood cultures growing MSSA MRI of cervical spine with concern of a fluid collection along posterior incision for recent cervical fusion injury, infection or abscess cannot be excluded.  Neurosurgery was consulted. S/p washout in OR,  cultures with MSSA Echocardiogram with EF of 40 to 45%, some concern of regional wall motion  abnormalities which are likely due to LBBB, cardiology is recommending outpatient follow-up Repeat blood cultures remain negative in 48 hours so PICC line was placed. -Continue cefazolin per pharmacy-will need at least 6 weeks of IV cefazolin followed by p.o. -ID is on board  Falls Patient had 2  recent falls over the past 2 days, CK lower at 29. Both seems mechanical, door dizziness or presyncopal symptoms. Pan imaging without any bony abnormality or acute fractures. MRI lumbar spine today with multilevel degenerative changes and no acute abnormality -PT and OT are recommending home health  Fever Now meeting criteria for sepsis.  T2DM (type 2 diabetes mellitus) (HCC) Patient uses Moun[employer] at home. -SSI  Chronic pain syndrome History of arthritis. -Continue home Celebrex and Neurontin -Adding some Norco due to uncontrolled pain  Asthma No upper respiratory symptoms or concern of exacerbation. -Continue home inhalers  Restless leg -Continue home Requip  Anxiety and depression -Continue home BuSpar and Cymbalta  Migraine No acute concern. -Continue home Imitrex as needed  Acid reflux -Continue home PPI  Electrolyte abnormality Hyponatremia improving, sodium at 134, hypomagnesemia and hypokalemia resolved -Continue to monitor  Microcytic anemia Patient with history of iron deficiency anemia, hemoglobin decreased to 7.3 s/p 1 unit of PRBC, currently hemoglobin stable at 8.4 -Starting on supplement -Monitor hemoglobin -Check FOBT   Subjective: Patient continued to have significant lower back and bilateral leg pain and asking for more pain medications, stating that it interfere with her mobility now.  Physical Exam: Vitals:   04/19/23 2210 04/20/23 0413 04/20/23 0751 04/20/23 1155  BP: (!) 143/81 (!) 150/77 (!) 146/87 (!) 152/76  Pulse: (!) 119 (!) 122 (!) 126 (!) 113  Resp: 18 18 16 18   Temp: 98 F (36.7 C) 99.4 F (37.4 C) 98 F (36.7 C) 98.4 F (36.9 C)  TempSrc:  Oral    SpO2: 97% 94% 96% 96%  Weight:      Height:       General.  Well-developed lady, in no acute distress.  Cervical drain is in place. Pulmonary.  Lungs clear bilaterally, normal respiratory effort. CV.  Regular rate and rhythm, no JVD, rub or murmur. Abdomen.  Soft, nontender,  nondistended, BS positive. CNS.  Alert and oriented .  No focal neurologic deficit. Extremities.  No edema, no cyanosis, pulses intact and symmetrical. Psychiatry.  Judgment and insight appears normal.   Data Reviewed: Prior data reviewed  Family Communication: Discussed with patient  Disposition: Status is: Inpatient Remains inpatient appropriate because: Severity of illness  Planned Discharge Destination: Home with home health  DVT prophylaxis.  Lovenox Time spent: 45 minutes  This record has been created using Conservation officer, historic buildings. Errors have been sought and corrected,but may not always be located. Such creation errors do not reflect on the standard of care.   Author: Arnetha Courser, MD 04/20/2023 3:46 PM  For on call review [email or web address].

## 2023-04-20 NOTE — Progress Notes (Signed)
Peripherally Inserted Central Catheter Placement  The IV Nurse has discussed with the patient and/or persons authorized to consent for the patient, the purpose of this procedure and the potential benefits and risks involved with this procedure.  The benefits include less needle sticks, lab draws from the catheter, and the patient may be discharged home with the catheter. Risks include, but not limited to, infection, bleeding, blood clot (thrombus formation), and puncture of an artery; nerve damage and irregular heartbeat and possibility to perform a PICC exchange if needed/ordered by physician.  Alternatives to this procedure were also discussed.  Bard Power PICC patient education guide, fact sheet on infection prevention and patient information card has been provided to patient /or left at bedside.    PICC Placement Documentation  PICC Single Lumen 04/20/23 Right Basilic 36 cm 1 cm (Active)  Indication for Insertion or Continuance of Line Prolonged intravenous therapies 04/20/23 1056  Exposed Catheter (cm) 1 cm 04/20/23 1056  Site Assessment Clean, Dry, Intact 04/20/23 1056  Line Status Flushed;Saline locked;Blood return noted 04/20/23 1056  Dressing Type Transparent;Securing device 04/20/23 1056  Dressing Status Antimicrobial disc/dressing in place;Clean, Dry, Intact 04/20/23 1056  Line Care Connections checked and tightened 04/20/23 1056  Line Adjustment (NICU/IV Team Only) No 04/20/23 1056  Dressing Intervention New dressing;Adhesive placed at insertion site (IV team only);Other (Comment) 04/20/23 1056  Dressing Change Due 04/27/23 04/20/23 1056       Annett Fabian 04/20/2023, 10:57 AM

## 2023-04-20 NOTE — Plan of Care (Signed)
  Problem: Education: Goal: Ability to describe self-care measures that may prevent or decrease complications (Diabetes Survival Skills Education) will improve Outcome: Progressing   Problem: Nutritional: Goal: Maintenance of adequate nutrition will improve Outcome: Progressing   Problem: Skin Integrity: Goal: Risk for impaired skin integrity will decrease Outcome: Progressing   Problem: Pain Managment: Goal: General experience of comfort will improve and/or be controlled Outcome: Progressing

## 2023-04-20 NOTE — TOC Progression Note (Signed)
Transition of Care [facility]) - Progression Note    Patient Details  Name: Summer Murphy MRN: [id number] Date of Birth: [birth year]  [facility]) CM/SW Contact  Allena Katz, LCSW Phone Number: 04/20/2023, 11:21 AM  Clinical Narrative:   MD reporting pt is going to need home antibiotics. Pam with [employer] notified. Pt already active with [facility]. Velna Hatchet is going to check if they can add a nurse to her already active orders with PT/OT.          Expected Discharge Plan and Services                                               Social Determinants of Health (SDOH) Interventions SDOH Screenings   Food Insecurity: No Food Insecurity (04/18/2023)  Housing: Low Risk  (04/18/2023)  Transportation Needs: No Transportation Needs (04/18/2023)  Utilities: Not At Risk (04/16/2023)  Alcohol Screen: Low Risk  (12/31/2021)  Depression (PHQ2-9): Low Risk  (01/27/2023)  Social Connections: Moderately Isolated (04/16/2023)  Tobacco Use: Low Risk  (04/16/2023)    Readmission Risk Interventions     No data to display

## 2023-04-20 NOTE — Progress Notes (Signed)
Physical Therapy Treatment Patient Details Name: Summer Murphy MRN: [id number] DOB: [birth year] Today's Date: 04/20/2023   History of Present Illness Patient is a 68 year old female with sepsis, wound infection, s/p cervical wound debridement. History of recent fall, C3-C6 laminectomy and posterior fusion 03/02/23, COPD, barrettts esophagus, hiatus hernia, HTN.    PT Comments  Patient is agreeable to PT session. She reports overall pain and weakness feels improved from prior session. Patient walked to the bathroom and in the room with rolling walker with CGA. Activity tolerance is limited by fatigue. Continued to encourage logroll technique with bed mobility.  Recommend to continue PT to maximize independence and decrease caregiver burden.    If plan is discharge home, recommend the following: A little help with walking and/or transfers;A little help with bathing/dressing/bathroom;Assist for transportation;Assistance with cooking/housework   Can travel by private vehicle        Equipment Recommendations  None recommended by PT    Recommendations for Other Services       Precautions / Restrictions Precautions Precautions: Fall Precaution Comments: hemovac, general spine precuations Restrictions Weight Bearing Restrictions Per Provider Order: No     Mobility  Bed Mobility Overal bed mobility: Needs Assistance Bed Mobility: Supine to Sit, Sit to Supine     Supine to sit: Contact guard Sit to supine: Contact guard assist   General bed mobility comments: encouraged logroll technique    Transfers Overall transfer level: Needs assistance Equipment used: Rolling walker (2 wheels) Transfers: Sit to/from Stand Sit to Stand: Contact guard assist           General transfer comment: for standing from bed and from toilet. cues for technique    Ambulation/Gait Ambulation/Gait assistance: Contact guard assist Gait Distance (Feet): 25 Feet Assistive device: Rolling walker  (2 wheels) Gait Pattern/deviations: Step-through pattern Gait velocity: decreased     General Gait Details: CGA provided for safety with ambulation with no loss of balance noted. patient reports improved leg pain and weakness compared to previous session   Stairs             Wheelchair Mobility     Tilt Bed    Modified Rankin (Stroke Patients Only)       Balance Overall balance assessment: Needs assistance Sitting-balance support: Feet supported Sitting balance-Leahy Scale: Fair     Standing balance support: No upper extremity supported, During functional activity Standing balance-Leahy Scale: Good Standing balance comment: stand by assistance provided to wash hands at sink with no loss of balance and no UE support required                            Cognition Arousal: Alert Behavior During Therapy: WFL for tasks assessed/performed Overall Cognitive Status: No family/caregiver present to determine baseline cognitive functioning                                          Exercises      General Comments        Pertinent Vitals/Pain Pain Assessment Pain Assessment: Faces Faces Pain Scale: Hurts a little bit Pain Location: legs Pain Descriptors / Indicators: Discomfort Pain Intervention(s): Monitored during session, Limited activity within patient's tolerance, Repositioned    Home Living  Prior Function            PT Goals (current goals can now be found in the care plan section) Acute Rehab PT Goals Patient Stated Goal: to see how she does and go home if possible PT Goal Formulation: With patient Time For Goal Achievement: 05/02/23 Potential to Achieve Goals: Good Progress towards PT goals: Progressing toward goals    Frequency    Min 1X/week      PT Plan      Co-evaluation              AM-PAC PT "6 Clicks" Mobility   Outcome Measure  Help needed turning from your  back to your side while in a flat bed without using bedrails?: A Little Help needed moving from lying on your back to sitting on the side of a flat bed without using bedrails?: A Little Help needed moving to and from a bed to a chair (including a wheelchair)?: A Little Help needed standing up from a chair using your arms (e.g., wheelchair or bedside chair)?: A Little Help needed to walk in hospital room?: A Little Help needed climbing 3-5 steps with a railing? : A Lot 6 Click Score: 17    End of Session   Activity Tolerance: Patient tolerated treatment well Patient left: in bed;with call bell/phone within reach;with bed alarm set   PT Visit Diagnosis: Other abnormalities of gait and mobility (R26.89);Muscle weakness (generalized) (M62.81)     Time: 4098-1191 PT Time Calculation (min) (ACUTE ONLY): 18 min  Charges:    $Therapeutic Activity: 8-22 mins PT General Charges $$ ACUTE PT VISIT: 1 Visit                     Donna Bernard, PT, MPT    Ina Homes 04/20/2023, 3:03 PM

## 2023-04-21 DIAGNOSIS — R7881 Bacteremia: Secondary | ICD-10-CM | POA: Diagnosis not present

## 2023-04-21 DIAGNOSIS — R6520 Severe sepsis without septic shock: Secondary | ICD-10-CM | POA: Diagnosis not present

## 2023-04-21 DIAGNOSIS — A4101 Sepsis due to Methicillin susceptible Staphylococcus aureus: Secondary | ICD-10-CM | POA: Diagnosis not present

## 2023-04-21 DIAGNOSIS — T8149XA Infection following a procedure, other surgical site, initial encounter: Secondary | ICD-10-CM | POA: Diagnosis not present

## 2023-04-21 DIAGNOSIS — B9561 Methicillin susceptible Staphylococcus aureus infection as the cause of diseases classified elsewhere: Secondary | ICD-10-CM | POA: Diagnosis not present

## 2023-04-21 LAB — TYPE AND SCREEN
ABO/RH(D): A POS
Antibody Screen: POSITIVE
Donor AG Type: NEGATIVE
Unit division: 0
Unit division: 0
Unit division: 0

## 2023-04-21 LAB — CBC
HCT: 25.6 % — ABNORMAL LOW (ref 36.0–46.0)
Hemoglobin: 8.5 g/dL — ABNORMAL LOW (ref 12.0–15.0)
MCH: 23.3 pg — ABNORMAL LOW (ref 26.0–34.0)
MCHC: 33.2 g/dL (ref 30.0–36.0)
MCV: 70.1 fL — ABNORMAL LOW (ref 80.0–100.0)
Platelets: 513 10*3/uL — ABNORMAL HIGH (ref 150–400)
RBC: 3.65 MIL/uL — ABNORMAL LOW (ref 3.87–5.11)
RDW: 23.4 % — ABNORMAL HIGH (ref 11.5–15.5)
WBC: 15 10*3/uL — ABNORMAL HIGH (ref 4.0–10.5)
nRBC: 0 % (ref 0.0–0.2)

## 2023-04-21 LAB — BASIC METABOLIC PANEL
Anion gap: 10 (ref 5–15)
BUN: 12 mg/dL (ref 8–23)
CO2: 30 mmol/L (ref 22–32)
Calcium: 8 mg/dL — ABNORMAL LOW (ref 8.9–10.3)
Chloride: 98 mmol/L (ref 98–111)
Creatinine, Ser: 0.41 mg/dL — ABNORMAL LOW (ref 0.44–1.00)
GFR, Estimated: >60 mL/min (ref >60)
Glucose, Bld: 92 mg/dL (ref 70–99)
Potassium: 3.2 mmol/L — ABNORMAL LOW (ref 3.5–5.1)
Sodium: 138 mmol/L (ref 135–145)

## 2023-04-21 LAB — BPAM RBC
Blood Product Expiration Date: 202501272359
Blood Product Expiration Date: 202501312359
Blood Product Expiration Date: 202502102359
ISSUE DATE / TIME: 202501251102
Unit Type and Rh: 5100
Unit Type and Rh: 5100
Unit Type and Rh: 5100

## 2023-04-21 LAB — GLUCOSE, CAPILLARY
Glucose-Capillary: 93 mg/dL (ref 70–99)
Glucose-Capillary: 110 mg/dL — ABNORMAL HIGH (ref 70–99)
Glucose-Capillary: 107 mg/dL — ABNORMAL HIGH (ref 70–99)
Glucose-Capillary: 79 mg/dL (ref 70–99)

## 2023-04-21 LAB — MAGNESIUM: Magnesium: 1.6 mg/dL — ABNORMAL LOW (ref 1.7–2.4)

## 2023-04-21 MED ORDER — MAGNESIUM SULFATE 2 GM/50ML IV SOLN
2.0000 g | Freq: Once | INTRAVENOUS | Status: AC
  Administered 2023-04-21: 2 g via INTRAVENOUS
  Filled 2023-04-21: qty 50

## 2023-04-21 MED ORDER — ACETAMINOPHEN 325 MG PO TABS
650.0000 mg | ORAL_TABLET | ORAL | Status: DC
  Administered 2023-04-21 – 2023-04-28 (×35): 650 mg via ORAL
  Filled 2023-04-21 (×38): qty 2

## 2023-04-21 MED ORDER — METHOCARBAMOL 500 MG PO TABS
750.0000 mg | ORAL_TABLET | Freq: Three times a day (TID) | ORAL | Status: DC
  Administered 2023-04-21 – 2023-04-28 (×22): 750 mg via ORAL
  Filled 2023-04-21 (×22): qty 2

## 2023-04-21 MED ORDER — OXYCODONE HCL 5 MG PO TABS
5.0000 mg | ORAL_TABLET | ORAL | Status: DC | PRN
  Administered 2023-04-21: 5 mg via ORAL
  Filled 2023-04-21: qty 1

## 2023-04-21 MED ORDER — OXYCODONE HCL 5 MG PO TABS
10.0000 mg | ORAL_TABLET | ORAL | Status: DC | PRN
  Administered 2023-04-21 – 2023-04-24 (×5): 10 mg via ORAL
  Filled 2023-04-21 (×5): qty 2

## 2023-04-21 MED ORDER — HYDROCODONE-ACETAMINOPHEN 5-325 MG PO TABS
1.0000 | ORAL_TABLET | ORAL | Status: DC | PRN
  Administered 2023-04-23 – 2023-04-28 (×5): 1 via ORAL
  Filled 2023-04-21 (×5): qty 1

## 2023-04-21 MED ORDER — OXYCODONE HCL 5 MG PO TABS: 10.0000 mg | ORAL_TABLET | ORAL | Status: DC | PRN

## 2023-04-21 NOTE — Plan of Care (Signed)

## 2023-04-21 NOTE — TOC Progression Note (Signed)
Transition of Care [facility]) - Progression Note    Patient Details  Name: LEATRICE PARILLA MRN: [id number] Date of Birth: 1955-08-04  [facility] CM/SW Contact  Allena Katz, LCSW Phone Number: 04/21/2023, 11:01 AM  Clinical Narrative:    Pt wants to be reassessed for SNF, she doesn't want to stay there for the duration of her antibiotics but does feel she needs more therapy. She states that she lives alone and has friends that assist but has no family. She has a walker and a rollator at home.         Expected Discharge Plan and Services                                               Social Determinants of Health (SDOH) Interventions SDOH Screenings   Food Insecurity: No Food Insecurity (04/18/2023)  Housing: Low Risk  (04/18/2023)  Transportation Needs: No Transportation Needs (04/18/2023)  Utilities: Not At Risk (04/16/2023)  Alcohol Screen: Low Risk  (12/31/2021)  Depression (PHQ2-9): Low Risk  (01/27/2023)  Social Connections: Moderately Isolated (04/16/2023)  Tobacco Use: Low Risk  (04/16/2023)    Readmission Risk Interventions     No data to display

## 2023-04-21 NOTE — Plan of Care (Signed)
  Problem: Coping: Goal: Ability to adjust to condition or change in health will improve 04/21/2023 0159 by Charlynn Grimes, RN Outcome: Progressing 04/21/2023 0159 by Charlynn Grimes, RN Outcome: Progressing   Problem: Fluid Volume: Goal: Ability to maintain a balanced intake and output will improve 04/21/2023 0159 by Charlynn Grimes, RN Outcome: Progressing 04/21/2023 0159 by Charlynn Grimes, RN Outcome: Progressing   Problem: Health Behavior/Discharge Planning: Goal: Ability to manage health-related needs will improve 04/21/2023 0159 by Charlynn Grimes, RN Outcome: Progressing 04/21/2023 0159 by Charlynn Grimes, RN Outcome: Progressing

## 2023-04-21 NOTE — Progress Notes (Signed)
Progress Note Patient: Summer Murphy XBM:[id number] DOB: [birth year] DOA: 04/16/2023     4 DOS: the patient was seen and examined on 04/21/2023   Brief hospital course: Summer Murphy is a 68 y.o. female with medical history significant of type 2 diabetes, asthma, arthritis s/p cervical fusion and anxiety came to ED after having a fall.  ED course: febrile at 101.1 and tachycardic, labs with no leukocytosis, hemoglobin 10.7 which is at her baseline, sodium at 132, albumin 2.5, CK29, lactic acid 1, magnesium 1.5, procalcitonin 0.40. UA and urine cultures are pending, Blood cultures were drawn.   Multiple imaging which include CT head, CT cervical spine, DG knee, DG hip and pelvis and CT scan of abdomen and pelvis was negative for any acute abnormality.   On exam she has a small indurated spot at the base of her cervical fusion incision with point tenderness, MRI was ordered and it shows some fluid collection, abscess cannot be ruled out.   1/25: Afebrile this morning, blood cultures growing Staph aureus-pending susceptibility.  Hemoglobin decreased to 7.3 without any obvious bleeding. Type and screen was done and it shows positive antibody-ordered 1 unit of PRBC and also consulted neurosurgery as there is no other obvious source of infection.  Started on cefazolin as there is no resistance detected. Patient now meets the criteria for sepsis with fever and tachycardia, Staph aureus bacteremia.  Echocardiogram also ordered. Neurosurgery is going to take her to the OR for washout.   1/26: Preliminary postprocedural culture with no organism, anemia panel consistent with anemia of chronic disease and iron deficiency.  Started on supplement. Repeat blood cultures ordered.  Urine cultures with no growth   1/27: Repeat blood cultures with no growth in 24-hour, neurosurgery obtained lumbar MRI as she was complaining of lower back and bilateral leg pain, it was motion degraded study but did show  multilevel degenerative changes and neural foraminal narrowing. Cervical wound culture with some Staph aureus.  TTE with EF of 45 to 50%, some concern of regional wall motion abnormalities likely due to LBBB, discussed with cardiology and they are recommending outpatient follow-up.  Appointment was set with Dr. Mariah Milling in February. Pending final recommendations from ID.   1/28: Hemodynamically stable, continue to have lower back and leg pain.  Cervical drain remain in place.  ID is recommending at least 6 weeks of IV cefazolin followed by another 4 to 6 weeks of p.o. antibiotics.  PICC line was placed today.  Repeat blood cultures negative for 48 hours.  Assessment and Plan: Sepsis (HCC) MSSA bacteremia. Patient met sepsis criteria with fever and tachycardia, blood cultures growing MSSA MRI of cervical spine with concern of a fluid collection along posterior incision for recent cervical fusion injury, infection or abscess cannot be excluded.  Neurosurgery was consulted. S/p washout in OR,  cultures with MSSA Echocardiogram with EF of 40 to 45%, some concern of regional wall motion abnormalities which are likely due to LBBB, cardiology is recommending outpatient follow-up Repeat blood cultures remain negative in 48 hours so PICC line was placed. -Continue cefazolin per pharmacy-will need at least 6 weeks of IV cefazolin followed by p.o. -ID is on board  S/p cervical posterior decompression and fusion- about 6 weeks ago. Washout on 1/25.  - neurosurgery following for drain removal 1/29. - PT/OT - staple removal 14 days post-op  Falls Patient had 2 recent falls over the past 2 days, CK lower at 29. Both seems mechanical, door dizziness or presyncopal symptoms.  Pan imaging without any bony abnormality or acute fractures. MRI lumbar spine with multilevel degenerative changes and no acute abnormality -PT and OT are recommending home health  T2DM (type 2 diabetes mellitus) (HCC) Patient uses  [employer] at home. -SSI  Chronic pain syndrome History of arthritis. -Continue home Celebrex and Neurontin -Adding some Norco due to uncontrolled pain  Asthma No upper respiratory symptoms or concern of exacerbation. -Continue home inhalers  Restless leg -Continue home Requip  Anxiety and depression -Continue home BuSpar and Cymbalta  Migraine No acute concern. -Continue home Imitrex as needed  Acid reflux -Continue home PPI  Electrolyte abnormality Hyponatremia improving, sodium at 134, hypomagnesemia and hypokalemia resolved -Continue to monitor - potassium and magnesium repleted today  Microcytic anemia Patient with history of iron deficiency anemia, hemoglobin decreased to 7.3 s/p 1 unit of PRBC, currently hemoglobin stable at 8.4>>8.5 -Starting on supplement -Monitor hemoglobin -Check FOBT   Subjective: Patient reports significant pain all over. Not abdominal pain. Legs and back primarily. Denies nausea. Had BM today. Requesting more pain management.   Physical Exam: Vitals:   04/20/23 1155 04/20/23 1551 04/20/23 2025 04/21/23 0410  BP: (!) 152/76 133/72 (!) 144/70 124/76  Pulse: (!) 113 (!) 109 100 98  Resp: 18 18    Temp: 98.4 F (36.9 C) 98 F (36.7 C) 98.2 F (36.8 C) 98.6 F (37 C)  TempSrc:    Oral  SpO2: 96% 97% 98% 92%  Weight:      Height:       General.  Well-developed. Appears uncomfortable. Cervical drain is in place. Pulmonary.  Lungs clear bilaterally, normal respiratory effort. CV.  Regular rate and rhythm, no JVD, rub or murmur. Abdomen.  Soft, nontender, nondistended, BS positive. CNS.  Alert and oriented .  No focal neurologic deficit. Extremities.  No edema, no cyanosis, pulses intact and symmetrical. PICC in R [facility] Psychiatry.  Judgment and insight appears normal.   Data Reviewed: Prior data reviewed  Family Communication: Discussed with patient  Disposition: Status is: Inpatient Remains inpatient appropriate because:  Severity of illness  Planned Discharge Destination: Home with home health  DVT prophylaxis.  Lovenox Time spent: 45 minutes   Author: Leeroy Bock, MD 04/21/2023 7:20 AM  For on call review [email or web address].

## 2023-04-21 NOTE — Treatment Plan (Signed)
Diagnosis: MSSA bacteremia and MSSA surgical site infection of the cervical spine area Baseline Creatinine <1    Allergies  Allergen Reactions   Librium [Chlordiazepoxide] Shortness Of Breath   Reglan [Metoclopramide] Hives and Other (See Comments)    hallucinations    Vanilla Shortness Of Breath    Pt reports allergy to vanilla extract only   Aspirin Hives   Nsaids Rash    Rash/flares asthma issues.   Azithromycin Other (See Comments)    Unsure of what the reaction was.   Buprenorphine Hcl Other (See Comments)    Unsure of what the reaction was.    Flexeril [Cyclobenzaprine]     hallucinations   Morphine Other (See Comments)    Unsure of reaction   Sulfa Antibiotics     Other reaction(s): Unknown   Tolmetin     Other Reaction: Allergy   Amlodipine Besylate Itching and Rash    arms, stomach and forehead   Iron Nausea And Vomiting    OPAT Orders Discharge antibiotics: Cefazolin 2 grams IV every 8 hours Duration: 6 weeks End Date: 05/28/23 After that will give cefadroxil PO   PIC Care Per Protocol:  Labs weekly while on IV antibiotics: _X_ CBC with differential  _X_ CMP _X_ CRP _X_ ESR   _X_ Please pull PIC at completion of IV antibiotics   Fax weekly lab results  promptly to [phone]  Clinic Follow Up Appt: 05/13/23 at 9.30 am with Dr.Jayd Forrey   Call [phone] twith any critical value or questions

## 2023-04-21 NOTE — Progress Notes (Signed)
Date of Admission:  04/16/2023     ID: Summer Murphy is a 68 y.o. female  Principal Problem:   Sepsis (HCC) Active Problems:   Migraine   Acid reflux   Restless leg   Microcytic anemia   Asthma   Anxiety and depression   T2DM (type 2 diabetes mellitus) (HCC)   Fever   Chronic pain syndrome   Falls   Electrolyte abnormality   MSSA bacteremia   Surgical site infection  Summer Murphy is a 68 y.o. with a history of cervical myelpathy for which she  underwent c3-c6 laminectomy and posterior fusion on 03/02/23, COPD, barrettts esophagus, hiatus hernia, HTN, followed by neurosurgery as OP and last seen by them on 04/12/23 with findings of overall wound healing well presented to the ED thru EMS from home on 04/16/23 after fall at home- as per the chart she was trying to reach for ehr walker and slipped and fell and could not get off the floor- 2 days prior to that she had slipped from her chair- she did not c/o fever or chills She had neck pain which was present since before surgery and not changed in intensity recently  Subjective: Pt doing okay Says her legs are still not strong and they hurt Says she is exercising in bed She is not comfortable to do Iv antibiotic at home  Medications:   acetaminophen  650 mg Oral Q4H   busPIRone  10 mg Oral BID   celecoxib  100 mg Oral BID   Chlorhexidine Gluconate Cloth  6 each Topical Daily   DULoxetine  60 mg Oral Daily   enoxaparin (LOVENOX) injection  40 mg Subcutaneous Q24H   Fe Fum-Vit C-Vit B12-FA  1 capsule Oral BID   fluticasone furoate-vilanterol  1 puff Inhalation Daily   gabapentin  900 mg Oral BID   insulin aspart  0-15 Units Subcutaneous TID WC   insulin aspart  0-5 Units Subcutaneous QHS   methocarbamol  750 mg Oral TID   montelukast  10 mg Oral QHS   pantoprazole  20 mg Oral Daily   rOPINIRole  1 mg Oral QHS   sodium chloride flush  10-40 mL Intracatheter Q12H   sodium chloride flush  3 mL Intravenous Q12H    traZODone  100 mg Oral QHS    Objective: Vital signs in last 24 hours: Patient Vitals for the past 24 hrs:  BP Temp Temp src Pulse Resp SpO2  04/21/23 0804 132/74 98.2 F (36.8 C) -- (!) 110 17 100 %  04/21/23 0410 124/76 98.6 F (37 C) Oral 98 -- 92 %  04/20/23 2025 (!) 144/70 98.2 F (36.8 C) -- 100 -- 98 %  04/20/23 1551 133/72 98 F (36.7 C) -- (!) 109 18 97 %  04/20/23 1155 (!) 152/76 98.4 F (36.9 C) -- (!) 113 18 96 %      PHYSICAL EXAM:  General: Alert, cooperative, pale  Neck: cervical area- surgical site open, staples present - drain removed Lungs: b/l air entry Heart: Regular rate and rhythm, no murmur, rub or gallop. Abdomen: Soft, non-tender,not distended. Bowel sounds normal. No masses Extremities: atraumatic, no cyanosis. No edema. No clubbing Skin: No rashes or lesions. Or bruising Lymph: Cervical, supraclavicular normal. Neurologic: Grossly non-focal  Lab Results    Latest Ref Rng & Units 04/21/2023    5:06 AM 04/20/2023    5:01 AM 04/18/2023    6:53 AM  CBC  WBC 4.0 - 10.5 K/uL 15.0  14.7  11.6   Hemoglobin 12.0 - 15.0 g/dL 8.5  9.0  8.4   Hematocrit 36.0 - 46.0 % 25.6  27.6  26.0   Platelets 150 - 400 K/uL 513  432  323        Latest Ref Rng & Units 04/21/2023    5:06 AM 04/20/2023    5:01 AM 04/18/2023    6:53 AM  CMP  Glucose 70 - 99 mg/dL 92  409  811   BUN 8 - 23 mg/dL 12  11  24    Creatinine 0.44 - 1.00 mg/dL 9.14  7.82  9.56   Sodium 135 - 145 mmol/L 138  134  131   Potassium 3.5 - 5.1 mmol/L 3.2  4.9  4.8   Chloride 98 - 111 mmol/L 98  98  102   CO2 22 - 32 mmol/L 30  25  22    Calcium 8.9 - 10.3 mg/dL 8.0  7.9  8.0       Microbiology: 04/16/23 BC MSSA both sets 04/17/23 surgical wound culture MSSA 04/18/23 BC- NG Studies/Results: [place] EKG SITE RITE Result Date: 04/20/2023 If Site Rite image not attached, placement could not be confirmed due to current cardiac rhythm.  MR LUMBAR SPINE WO CONTRAST Result Date: 04/19/2023 CLINICAL  DATA:  Lumbar radiculopathy, prior surgery, new symptoms. EXAM: MRI LUMBAR SPINE WITHOUT CONTRAST TECHNIQUE: Multiplanar, multisequence MR imaging of the lumbar spine was performed. No intravenous contrast was administered. COMPARISON:  MRI of the lumbar spine November 05, 2005. FINDINGS: The study is severely degraded by motion. Segmentation: 5 lumbar type vertebrae assumed. Alignment:  Small retrolisthesis at L1-2 and L4-5. Vertebrae: STIR images suggest endplate marrow edema at L3-4. However, these images are severely degraded by motion. T12-L1 appear chronically fused. No fracture with significant height loss of retropulsion. Conus medullaris and cauda equina: Conus extends to the L1-2 level. Paraspinal and other soft tissues: Grossly unremarkable. Disc levels: T12-L1: Fused level. No high-grade spinal canal or neural foraminal stenosis. L1-2: Disc bulge and facet degenerative changes with mild bilateral neural foraminal narrowing. No high-grade spinal canal stenosis. L2-3: Disc bulge and facet degenerative changes results in likely moderate spinal canal stenosis and mild-to-moderate bilateral neural foraminal narrowing. L3-4: Likely mild to moderate spinal canal stenosis and mild bilateral neural foraminal narrowing. L4-5: Likely moderate spinal canal stenosis and moderate bilateral neural foraminal narrowing. L5-S1: There appears to be severe left and moderate right neural foraminal narrowing. No spinal canal stenosis. IMPRESSION: 1. Severely motion degraded study. Repeat study when clinically appropriate suggested. 2. Multilevel degenerative changes of the lumbar spine with likely moderate spinal canal stenosis at L2-3 and L4-5. 3. Multilevel neural foraminal narrowing, severe on the left at L5-S1. Electronically Signed   By: Baldemar Lenis M.D.   On: 04/19/2023 15:27     Assessment/Plan: MSSA bacteremia  due to cervical spine surgical site infection S/p I/D - culture staph  aureues Continue cefazolin Repeat blood culture from 1/26 neg so far TEE will not change the management or duration as she will need 6 weeks of IV antibiotic followed by PO for another 6 weeks She cannot  handle doing  IV at home  Will need rehab     Recent c3-c6 laminectomy and posterior fusion on 03/02/23 MRI showed superficial and deep abscess- underwent I/D and culture is MSSA Continue cefazolin for 6 weeks followed by PO for 6 more weeks   Anemia   Hiatus hernia h/o fundoplication Dysphagia has had esophageal dilatation  Nov 2024 Biopsy no evidence of metaplasia ? Anxiety  on meds Discussed the management with patient

## 2023-04-21 NOTE — Progress Notes (Signed)
   Neurosurgery Progress Note  History: Summer Murphy is s/p cervical wound debridement  POD4: continues to have back and bilateral leg pain this morning  POD3: Patient denies any neck pain this morning. She continues to complain of back pain. POD2: Pt complaining of increased low back and bilateral radiating leg pain since yesterday. POD1: She states her neck pain is better this morning. She has had no other complaints.   Physical Exam: Vitals:   04/21/23 0410 04/21/23 0804  BP: 124/76 132/74  Pulse: 98 (!) 110  Resp:  17  Temp: 98.6 F (37 C) 98.2 F (36.8 C)  SpO2: 92% 100%    AA Ox3 CNI  Strength:5/5 throughout  Incision c/d/I with staples in place   Data:  MRI Lumbar spine 04/19/23: IMPRESSION: 1. Severely motion degraded study. Repeat study when clinically appropriate suggested. 2. Multilevel degenerative changes of the lumbar spine with likely moderate spinal canal stenosis at L2-3 and L4-5. 3. Multilevel neural foraminal narrowing, severe on the left at L5-S1.     Assessment/Plan:  Summer Murphy is a 68 y.o s/p cervical posterior decompression and fusion about 6 weeks ago presenting after a fall. She was found to be febrile with concerns for sepsis. Blood cultures positive for MSSA bacteremia and MRI C spine showing fluid collection. She returned to the OR on 1/25 for wound washout.   - mobilize - pain control; scheduled Tylenol and Robaxin and changed from Norco to Oxycodone  - ok for DVT prophylaxis from neurosurgerical standpoint  - PTOT - HV removed 1/29. Incision with staples in place left open to air - ID following for MSSA bacteremia. Recommending 6 weeks of IV antibiotics followed by 6 weeks of PO. - will need staple removal around 14 days post-op. Appointment has been made.  Manning Charity PA-C Department of Neurosurgery

## 2023-04-22 DIAGNOSIS — A4101 Sepsis due to Methicillin susceptible Staphylococcus aureus: Secondary | ICD-10-CM | POA: Diagnosis not present

## 2023-04-22 DIAGNOSIS — R6520 Severe sepsis without septic shock: Secondary | ICD-10-CM | POA: Diagnosis not present

## 2023-04-22 LAB — SEDIMENTATION RATE: Sed Rate: 53 mm/h — ABNORMAL HIGH (ref 0–30)

## 2023-04-22 LAB — GLUCOSE, CAPILLARY
Glucose-Capillary: 90 mg/dL (ref 70–99)
Glucose-Capillary: 124 mg/dL — ABNORMAL HIGH (ref 70–99)

## 2023-04-22 LAB — C-REACTIVE PROTEIN: CRP: 11.7 mg/dL — ABNORMAL HIGH (ref <1.0)

## 2023-04-22 MED ORDER — RIFAMPIN 300 MG PO CAPS
300.0000 mg | ORAL_CAPSULE | Freq: Two times a day (BID) | ORAL | Status: DC
  Administered 2023-04-23 – 2023-04-26 (×7): 300 mg via ORAL
  Filled 2023-04-22 (×8): qty 1

## 2023-04-22 NOTE — TOC Progression Note (Signed)
Transition of Care [facility]) - Progression Note    Patient Details  Name: Summer Murphy MRN: [id number] Date of Birth: 01-02-1956  Transition of Care [facility]) CM/SW Contact  Allena Katz, LCSW Phone Number: 04/22/2023, 12:23 PM  Clinical Narrative: [name]  [phone] [id number]         Expected Discharge Plan and Services                                               Social Determinants of Health (SDOH) Interventions SDOH Screenings   Food Insecurity: No Food Insecurity (04/18/2023)  Housing: Low Risk  (04/18/2023)  Transportation Needs: No Transportation Needs (04/18/2023)  Utilities: Not At Risk (04/16/2023)  Alcohol Screen: Low Risk  (12/31/2021)  Depression (PHQ2-9): Low Risk  (01/27/2023)  Social Connections: Moderately Isolated (04/16/2023)  Tobacco Use: Low Risk  (04/16/2023)    Readmission Risk Interventions     No data to display

## 2023-04-22 NOTE — [place] FL2 (Addendum)
[place] MEDICAID FL2 LEVEL OF CARE FORM     IDENTIFICATION  Patient Name: Summer Murphy Birthdate: 1955-07-15 Sex: female Admission Date (Current Location): 04/16/2023  [place] and [place] Number:  Chiropodist and Address:  [facility], [address] [place], [place] [address]      Provider Number: [id number]  Attending Physician Name and Address:  Leeroy Bock, MD  Relative Name and Phone Number:  Perry,Brenda (Relative)  [phone] (    Current Level of Care: Hospital Recommended Level of Care: Skilled Nursing Facility Prior Approval Number:    Date Approved/Denied:   PASRR Number: pending  Discharge Plan: SNF    Current Diagnoses: Patient Active Problem List   Diagnosis Date Noted   MSSA bacteremia 04/19/2023   Surgical site infection 04/19/2023   Fever 04/16/2023   Falls 04/16/2023   Electrolyte abnormality 04/16/2023   Chronic pain syndrome    S/P cervical spinal fusion 04/12/2023   Cervical myelopathy (HCC) 03/02/2023   T2DM (type 2 diabetes mellitus) (HCC) 02/28/2023   Spinal stenosis in cervical region 02/04/2023   Stenosis of cervical spine with myelopathy (HCC) 02/04/2023   Cervical radicular pain (left severe) 12/10/2022   Foraminal stenosis of cervical region 11/25/2022   Cervical disc disorder with radiculopathy of cervical region 11/25/2022   Cervicogenic headache 11/25/2022   Chronic asthmatic bronchitis (HCC) 06/12/2022   Uncontrolled type 2 diabetes mellitus with hyperglycemia, without long-term current use of insulin (HCC) 06/03/2022   Aspiration pneumonia (HCC) 06/02/2022   Anxiety and depression 06/02/2022   Essential hypertension 06/02/2022   Acute respiratory failure with hypoxia (HCC) 06/02/2022   Asthma 06/01/2022   DOE (dyspnea on exertion) 05/13/2022   Multifocal pneumonia 05/12/2022   Hypertensive urgency 05/12/2022   Sepsis (HCC) 05/12/2022   CAP (community acquired pneumonia)  04/24/2022   PNA (pneumonia) 04/24/2022   Hypocalcemia 07/31/2021   Esophageal dysphagia    Benign esophageal stricture    S/P hardware removal 11/14/2020   Microcytic anemia 10/18/2020   Moderate persistent asthma with acute exacerbation 08/21/2020   S/P revision of total knee, right 11/14/2019   Seasonal allergic rhinitis due to pollen 09/20/2019   Callus of foot 08/13/2019   Candidal vaginitis 08/13/2019   Other symptoms and signs involving the nervous system 08/13/2019   Encounter for general adult medical examination with abnormal findings 08/13/2019   Restless leg 04/30/2019   Muscle cramps 04/02/2019   Diastolic dysfunction 12/14/2018   SOB (shortness of breath) 12/14/2018   Acid reflux 12/14/2018   Wheezing 12/14/2018   Impaired fasting glucose 12/14/2018   COPD with acute exacerbation (HCC) 09/28/2018   Acute upper respiratory infection 08/25/2018   Recurrent sinusitis 08/15/2018   Prediabetes 08/15/2018   Nonintractable headache 08/15/2018   Dysuria 08/15/2018   Migraine 04/22/2018   Nausea 04/22/2018   Polyneuropathy associated with underlying disease (HCC) 04/22/2018   Midline low back pain without sciatica 04/22/2018   Routine cervical smear 04/22/2018   HTN (hypertension) 07/01/2017   Acute bronchitis with asthma 07/01/2017   Allergic rhinitis 07/01/2017   Moderate asthma without complication 01/20/2016   Severe recurrent major depression without psychotic features (HCC) 09/10/2014   COPD (chronic obstructive pulmonary disease) (HCC) 09/10/2014   Calculi, ureter 04/26/2013   Corneal graft malfunction 08/17/2012   Calculus of kidney 04/26/2012   Renal colic 04/26/2012   Urge incontinence 04/26/2012   Diaphragmatic hernia 10/27/2010   Barrett esophagus 07/07/2010    Orientation RESPIRATION BLADDER Height & Weight  Self, Time, Situation, Place  Normal Continent Weight: 125 lb 3.5 oz (56.8 kg) Height:  5\' 3"  (160 cm)  BEHAVIORAL SYMPTOMS/MOOD  NEUROLOGICAL BOWEL NUTRITION STATUS      Continent    AMBULATORY STATUS COMMUNICATION OF NEEDS Skin   Limited Assist Verbally  (Close system drain left)                       Personal Care Assistance Level of Assistance  Bathing, Feeding, Dressing Bathing Assistance: Limited assistance Feeding assistance: Limited assistance Dressing Assistance: Limited assistance     Functional Limitations Info  Sight, Hearing, Speech Sight Info: Adequate Hearing Info: Adequate Speech Info: Adequate    SPECIAL CARE FACTORS FREQUENCY  PT (By licensed PT), OT (By licensed OT)     PT Frequency: 5 times a week OT Frequency: 5 times a week            Contractures Contractures Info: Not present    Additional Factors Info  Code Status Code Status Info: FULL             Current Medications (04/22/2023):  This is the current hospital active medication list Current Facility-Administered Medications  Medication Dose Route Frequency Provider Last Rate Last Admin   acetaminophen (TYLENOL) tablet 650 mg  650 mg Oral Q4H Susanne Borders, [place]   650 mg at 04/22/23 1610   busPIRone (BUSPAR) tablet 10 mg  10 mg Oral BID Arnetha Courser, MD   10 mg at 04/22/23 0939   ceFAZolin (ANCEF) IVPB 2g/100 mL premix  2 g Intravenous Q8H Manuela Schwartz, NP 200 mL/hr at 04/22/23 0612 2 g at 04/22/23 9604   celecoxib (CELEBREX) capsule 100 mg  100 mg Oral BID Arnetha Courser, MD   100 mg at 04/22/23 5409   Chlorhexidine Gluconate Cloth 2 % PADS 6 each  6 each Topical Daily Arnetha Courser, MD   6 each at 04/22/23 0939   clonazePAM (KLONOPIN) tablet 0.5-1 mg  0.5-1 mg Oral BID PRN Arnetha Courser, MD   1 mg at 04/19/23 0415   DULoxetine (CYMBALTA) DR capsule 60 mg  60 mg Oral Daily Arnetha Courser, MD   60 mg at 04/22/23 0939   enoxaparin (LOVENOX) injection 40 mg  40 mg Subcutaneous Q24H Arnetha Courser, MD   40 mg at 04/21/23 2055   Fe Fum-Vit C-Vit B12-FA (TRIGELS-F FORTE) capsule 1 capsule  1 capsule Oral BID Arnetha Courser, MD   1 capsule at 04/22/23 0938   fluticasone furoate-vilanterol (BREO ELLIPTA) 200-25 MCG/ACT 1 puff  1 puff Inhalation Daily Arnetha Courser, MD   1 puff at 04/22/23 0829   gabapentin (NEURONTIN) capsule 900 mg  900 mg Oral BID Arnetha Courser, MD   900 mg at 04/22/23 8119   HYDROcodone-acetaminophen (NORCO/VICODIN) 5-325 MG per tablet 1 tablet  1 tablet Oral Q4H PRN Leeroy Bock, MD       insulin aspart (novoLOG) injection 0-15 Units  0-15 Units Subcutaneous TID WC Arnetha Courser, MD   3 Units at 04/17/23 1821   ipratropium-albuterol (DUONEB) 0.5-2.5 (3) MG/3ML nebulizer solution 3 mL  3 mL Inhalation Q6H PRN Arnetha Courser, MD   3 mL at 04/18/23 1955   methocarbamol (ROBAXIN) tablet 750 mg  750 mg Oral TID Susanne Borders, PA   750 mg at 04/22/23 0938   montelukast (SINGULAIR) tablet 10 mg  10 mg Oral QHS Arnetha Courser, MD   10 mg at 04/21/23 2049   ondansetron (ZOFRAN) tablet  4 mg  4 mg Oral Q6H PRN Arnetha Courser, MD   4 mg at 04/20/23 0430   Or   ondansetron [facility]) injection 4 mg  4 mg Intravenous Q6H PRN Arnetha Courser, MD   4 mg at 04/22/23 0801   oxyCODONE (Oxy IR/ROXICODONE) immediate release tablet 10 mg  10 mg Oral Q4H PRN Leeroy Bock, MD   10 mg at 04/22/23 0829   pantoprazole (PROTONIX) EC tablet 20 mg  20 mg Oral Daily Arnetha Courser, MD   20 mg at 04/22/23 1610   polyethylene glycol (MIRALAX / GLYCOLAX) packet 17 g  17 g Oral Daily PRN Arnetha Courser, MD   17 g at 04/18/23 1339   rOPINIRole (REQUIP) tablet 1 mg  1 mg Oral QHS Arnetha Courser, MD   1 mg at 04/21/23 2055   sodium chloride flush (NS) 0.9 % injection 10-40 mL  10-40 mL Intracatheter Q12H Arnetha Courser, MD   10 mL at 04/22/23 0941   sodium chloride flush (NS) 0.9 % injection 10-40 mL  10-40 mL Intracatheter PRN Arnetha Courser, MD       sodium chloride flush (NS) 0.9 % injection 3 mL  3 mL Intravenous Q12H Arnetha Courser, MD   3 mL at 04/22/23 0941   SUMAtriptan (IMITREX) tablet 100 mg  100 mg Oral Q2H PRN  Arnetha Courser, MD       traZODone (DESYREL) tablet 100 mg  100 mg Oral QHS Arnetha Courser, MD   100 mg at 04/21/23 2049     Discharge Medications: Please see discharge summary for a list of discharge medications.  Relevant Imaging Results:  Relevant Lab Results:   Additional Information [id number] WILL NEED 6 WKS IV ANTIBIOTICS  Allena Katz, LCSW

## 2023-04-22 NOTE — TOC PASRR Note (Signed)
RE: Summer Murphy Date of Birth: [birth year] Date: 04/22/2023     To Whom It May Concern:   Please be advised that the above-named patient will require a short-term nursing home stay - anticipated 30 days or less for rehabilitation and strengthening.  The plan is for return home

## 2023-04-22 NOTE — Progress Notes (Signed)
PHARMACY CONSULT NOTE FOR:  OUTPATIENT  PARENTERAL ANTIBIOTIC THERAPY (OPAT)  Indication: MSSA bacteremia and surgical site infection Regimen: Cefazolin 2gm IV q8h End date: 05/28/2023  -Labs - Once weekly:  CBC/D, CMP, and ESR and CRP  -Fax weekly lab results  promptly to [phone].  -Please pull PIC at completion of IV antibiotics.  -Call [phone] with any critical value or questions.   IV antibiotic discharge orders are pended. To discharging provider:  please sign these orders via discharge navigator,  Select New Orders & click on the button choice - Manage This Unsigned Work.     Thank you for allowing pharmacy to be a part of this patient's care.  Juliette Alcide, PharmD, BCPS, BCIDP Work Cell: [phone] 04/22/2023 12:25 PM

## 2023-04-22 NOTE — Plan of Care (Signed)

## 2023-04-22 NOTE — Progress Notes (Signed)
Occupational Therapy Treatment Patient Details Name: SIDNEY SILBERMAN MRN: [id number] DOB: 12-13-1955 Today's Date: 04/22/2023   History of present illness Patient is a 68 year old female with sepsis, wound infection, s/p cervical wound debridement. History of recent fall, C3-C6 laminectomy and posterior fusion 03/02/23, COPD, barrettts esophagus, hiatus hernia, HTN.   OT comments  Pt in bed upon OT arrival, reporting bilat groin pain, but medicated about 2 hours ago, per pt.  Pt pleasant and agreeable to OT participation this date.  Overall performing functional transfers with close supv and ambulatory to bathroom for toileting with min guard and RW.  Able to void urine and pass gas.  Able to manage peri care, clothing management, and hand hygiene with close SBA, but with increased pain in bilat groin while standing, 8/10 pain.  See below for additional ADL details, including bed mobility.  OT in agreement with PT d/c recommendation change to SNF d/t pt with limited mobility and pt living alone.  Pt reports that her sister-in-law is visiting for the next 2 days and will be with her tomorrow morning at the hospital and SIL is eager to be a part of d/c planning when the time comes.  Returned to bed with reminders for log rolling technique; all necessary items within reach.  Will continue to follow to maximize safety and indep with ADLs and functional mobility.         If plan is discharge home, recommend the following:  A little help with walking and/or transfers;A little help with bathing/dressing/bathroom;Assistance with cooking/housework;Assist for transportation;Help with stairs or ramp for entrance   Equipment Recommendations  Other (comment) (2ww)    Recommendations for Other Services      Precautions / Restrictions Precautions Precautions: Fall Precaution Comments: hemovac, general spine precautions Restrictions Weight Bearing Restrictions Per Provider Order: No       Mobility Bed  Mobility Overal bed mobility: Needs Assistance Bed Mobility: Supine to Sit, Sit to Supine     Supine to sit: Supervision, Used rails Sit to supine: Supervision, Used rails   General bed mobility comments: Reminder to log roll as pt did not do this during supine to sit.  Good ability to log roll when reminded for sit to supine.  Pt demonstrated good ability to scoot self toward [facility] once bed was flattened, though painful. Patient Response: Cooperative  Transfers Overall transfer level: Needs assistance Equipment used: Rolling walker (2 wheels) Transfers: Sit to/from Stand Sit to Stand: Supervision, Contact guard assist           General transfer comment: performed with walker set up and close SBA; verbalized pain in groin     Balance Overall balance assessment: Needs assistance Sitting-balance support: Feet supported Sitting balance-Leahy Scale: Good     Standing balance support: Single extremity supported, Reliant on assistive device for balance, During functional activity Standing balance-Leahy Scale: Fair Standing balance comment: Close SBA for peri care and clothing management in standing using one hand on walker for support.                           ADL either performed or assessed with clinical judgement   ADL Overall ADL's : Needs assistance/impaired     Grooming: Wash/dry hands;Supervision/safety;Standing Grooming Details (indicate cue type and reason): Pt leaned on to sink countertop to take pressure off of her BLEs d/t groin pain  Toilet Transfer: Electrical engineer Details (indicate cue type and reason): BSC over toilet Toileting- Clothing Manipulation and Hygiene: Supervision/safety;Sit to/from stand Toileting - Clothing Manipulation Details (indicate cue type and reason): 1 hand on walker for support     Functional mobility during ADLs: Contact guard assist;Cueing for safety;Rolling walker (2  wheels) General ADL Comments: limited standing tolerance d/t groin pain    Extremity/Trunk Assessment Upper Extremity Assessment Upper Extremity Assessment: Overall WFL for tasks assessed   Lower Extremity Assessment Lower Extremity Assessment: Generalized weakness        Vision       Perception     Praxis      Cognition Arousal: Alert Behavior During Therapy: WFL for tasks assessed/performed                                            Exercises      Shoulder Instructions       General Comments      Pertinent Vitals/ Pain       Pain Assessment Pain Assessment: 0-10 Pain Score: 8  Pain Location: bilat groin Pain Descriptors / Indicators: Discomfort, Aching Pain Intervention(s): Monitored during session, Premedicated before session, Repositioned, Limited activity within patient's tolerance  Home Living                                          Prior Functioning/Environment              Frequency  Min 1X/week        Progress Toward Goals  OT Goals(current goals can now be found in the care plan section)  Progress towards OT goals: Progressing toward goals  Acute Rehab OT Goals Patient Stated Goal: go home OT Goal Formulation: With patient Time For Goal Achievement: 05/02/23 Potential to Achieve Goals: Good  Plan                       AM-PAC OT "6 Clicks" Daily Activity     Outcome Measure   Help from another person eating meals?: None Help from another person taking care of personal grooming?: A Little Help from another person toileting, which includes using toliet, bedpan, or urinal?: A Little Help from another person bathing (including washing, rinsing, drying)?: A Little Help from another person to put on and taking off regular upper body clothing?: A Little Help from another person to put on and taking off regular lower body clothing?: A Little 6 Click Score: 19    End of Session Equipment  Utilized During Treatment: Gait belt;Rolling walker (2 wheels)  OT Visit Diagnosis: Muscle weakness (generalized) (M62.81);Repeated falls (R29.6)   Activity Tolerance Patient limited by pain;Patient tolerated treatment well   Patient Left in bed;with call bell/phone within reach;with bed alarm set             Time: 1435-1453 OT Time Calculation (min): 18 min  Charges: OT General Charges $OT Visit: 1 Visit OT Treatments $Self Care/Home Management : 8-22 mins  Danelle Earthly, MS, OTR/L   [place]tis Dials 04/22/2023, 3:15 PM

## 2023-04-22 NOTE — Progress Notes (Signed)
   Neurosurgery Progress Note  History: Summer Murphy is s/p cervical wound debridement  POD5: Pt reporting improved back and neck pain with medication changes yesterday. POD4: continues to have back and bilateral leg pain this morning  POD3: Patient denies any neck pain this morning. She continues to complain of back pain. POD2: Pt complaining of increased low back and bilateral radiating leg pain since yesterday. POD1: She states her neck pain is better this morning. She has had no other complaints.   Physical Exam: Vitals:   04/22/23 0245 04/22/23 0717  BP: 122/65 128/67  Pulse: 95 95  Resp: 18 16  Temp: 98.4 F (36.9 C) 98.7 F (37.1 C)  SpO2: 95% 97%    AA Ox3. On the phone  CNI  Strength:5/5 throughout  Incision c/d/I with staples in place   Data:  MRI Lumbar spine 04/19/23: IMPRESSION: 1. Severely motion degraded study. Repeat study when clinically appropriate suggested. 2. Multilevel degenerative changes of the lumbar spine with likely moderate spinal canal stenosis at L2-3 and L4-5. 3. Multilevel neural foraminal narrowing, severe on the left at L5-S1.     Assessment/Plan:  Summer Murphy is a 68 y.o s/p cervical posterior decompression and fusion about 6 weeks ago presenting after a fall. She was found to be febrile with concerns for sepsis. Blood cultures positive for MSSA bacteremia and MRI C spine showing fluid collection. She returned to the OR on 1/25 for wound washout.   - mobilize - pain control; scheduled Tylenol and Robaxin and changed from Norco to Oxycodone  - ok for DVT prophylaxis from neurosurgerical standpoint  - PTOT - HV removed 1/29. Incision with staples in place left open to air - ID following for MSSA bacteremia. Recommending 6 weeks of IV antibiotics followed by 6 weeks of PO. - will need staple removal around 14 days post-op. Appointment has been made.  Manning Charity PA-C Department of Neurosurgery

## 2023-04-22 NOTE — Progress Notes (Signed)
Progress Note Patient: Summer Murphy:[id number] DOB: [birth year] DOA: 04/16/2023     5 DOS: the patient was seen and examined on 04/22/2023   Brief hospital course: Summer Murphy is a 68 y.o. female with medical history significant of type 2 diabetes, asthma, arthritis s/p cervical fusion and anxiety came to ED after having a fall.  ED course: febrile at 101.1 and tachycardic, labs with no leukocytosis, hemoglobin 10.7 which is at her baseline, sodium at 132, albumin 2.5, CK29, lactic acid 1, magnesium 1.5, procalcitonin 0.40. UA and urine cultures are pending, Blood cultures were drawn.   Multiple imaging which include CT head, CT cervical spine, DG knee, DG hip and pelvis and CT scan of abdomen and pelvis was negative for any acute abnormality.   On exam she has a small indurated spot at the base of her cervical fusion incision with point tenderness, MRI was ordered and it shows some fluid collection, abscess cannot be ruled out.   1/25: Afebrile this morning, blood cultures growing Staph aureus-pending susceptibility.  Hemoglobin decreased to 7.3 without any obvious bleeding. Type and screen was done and it shows positive antibody-ordered 1 unit of PRBC and also consulted neurosurgery as there is no other obvious source of infection.  Started on cefazolin as there is no resistance detected. Patient now meets the criteria for sepsis with fever and tachycardia, Staph aureus bacteremia.  Echocardiogram also ordered. Neurosurgery is going to take her to the OR for washout.   1/26: Preliminary postprocedural culture with no organism, anemia panel consistent with anemia of chronic disease and iron deficiency.  Started on supplement. Repeat blood cultures ordered.  Urine cultures with no growth   1/27: Repeat blood cultures with no growth in 24-hour, neurosurgery obtained lumbar MRI as she was complaining of lower back and bilateral leg pain, it was motion degraded study but did show  multilevel degenerative changes and neural foraminal narrowing. Cervical wound culture with some Staph aureus.  TTE with EF of 45 to 50%, some concern of regional wall motion abnormalities likely due to LBBB, discussed with cardiology and they are recommending outpatient follow-up.  Appointment was set with Dr. Mariah Milling in February. Pending final recommendations from ID.   1/28: Hemodynamically stable, continue to have lower back and leg pain.  Cervical drain remain in place.  ID is recommending at least 6 weeks of IV cefazolin followed by another 4 to 6 weeks of p.o. antibiotics.  PICC line was placed today.  Repeat blood cultures negative for 48 hours. 1/29: drain removed.  1/30: patient doing well. PT updated recs to SNF at dc. TOC informed. She is cleared to dc when bed available. Continue pain control, IV abx course as recommended by ID and mobilize patient as much as possible. Staple removal by neurosurgery 14 days post-op. Healing well.   Assessment and Plan: Sepsis (HCC) MSSA bacteremia. Patient met sepsis criteria with fever and tachycardia, blood cultures growing MSSA MRI of cervical spine with concern of a fluid collection along posterior incision for recent cervical fusion injury, infection or abscess cannot be excluded.  Neurosurgery was consulted. S/p washout in OR,  cultures with MSSA Echocardiogram with EF of 40 to 45%, some concern of regional wall motion abnormalities which are likely due to LBBB, cardiology is recommending outpatient follow-up Repeat blood cultures remain negative in 48 hours so PICC line was placed. -Continue cefazolin per pharmacy-will need at least 6 weeks of IV cefazolin followed by p.o. -ID is on board  S/p  cervical posterior decompression and fusion- about 6 weeks ago. Washout on 1/25.  - neurosurgery following for drain removal 1/29. - PT/OT - staple removal 14 days post-op  Falls- no acute changes on extensive imaging on presentation.  MRI lumbar  spine with multilevel degenerative changes and no acute abnormality -PT and OT   T2DM (type 2 diabetes mellitus) (HCC) Patient uses Mounjaro at home. - not requiring any insulin from sliding scale. Will dc the scale and CBG checks. She's stable on diet alone  Chronic pain syndrome History of arthritis. -Continue home Celebrex and Neurontin -Adding some Norco due to uncontrolled pain  Asthma No upper respiratory symptoms or concern of exacerbation. -Continue home inhalers  Restless leg -Continue home Requip  Anxiety and depression -Continue home BuSpar and Cymbalta  Migraine No acute concern. -Continue home Imitrex as needed  Acid reflux -Continue home PPI  Electrolyte abnormality Hyponatremia improving, sodium at 134, hypomagnesemia and hypokalemia resolved -Continue to monitor - potassium and magnesium repleted  Microcytic anemia Patient with history of iron deficiency anemia, hemoglobin decreased to 7.3 s/p 1 unit of PRBC, currently hemoglobin stable at 8.4>>8.5 -Starting on supplement -Monitor hemoglobin -Check FOBT   Subjective: Patient reports feeling improved pain with pain medication changes. She does not feel that she can go home. Is nervous about IV medications at home. She was able to ambulate to bathroom with walker today.   Physical Exam: Vitals:   04/21/23 0804 04/21/23 1535 04/21/23 1949 04/22/23 0245  BP: 132/74 121/61 129/71 122/65  Pulse: (!) 110 93 95 95  Resp: 17 18 18 18   Temp: 98.2 F (36.8 C) 98.4 F (36.9 C) 98.7 F (37.1 C) 98.4 F (36.9 C)  TempSrc:      SpO2: 100% 97% 98% 95%  Weight:      Height:       General.  Well-developed. Appears uncomfortable. Pulmonary.  Lungs clear bilaterally, normal respiratory effort. CV.  Regular rate and rhythm, no JVD, rub or murmur. Abdomen.  Soft, nontender, nondistended, BS positive. CNS.  Alert and oriented .  No focal neurologic deficit. Extremities.  No edema, no cyanosis, pulses intact  and symmetrical. PICC in R UE. Staples on back of neck without drainage or signs of infection Psychiatry.  Judgment and insight appears normal.   Data Reviewed: Prior data reviewed  Family Communication: Discussed with patient  Disposition: Status is: Inpatient Remains inpatient appropriate because: Severity of illness  Planned Discharge Destination: Home with home health  DVT prophylaxis.  Lovenox Time spent: 45 minutes   Author: Leeroy Bock, MD 04/22/2023 7:13 AM  For on call review [email or web address].

## 2023-04-22 NOTE — Progress Notes (Signed)
Physical Therapy Treatment Patient Details Name: Summer Murphy MRN: [id number] DOB: [birth year] Today's Date: 04/22/2023   History of Present Illness Patient is a 68 year old female with sepsis, wound infection, s/p cervical wound debridement. History of recent fall, C3-C6 laminectomy and posterior fusion 03/02/23, COPD, barrettts esophagus, hiatus hernia, HTN.    PT Comments  Pt struggled with PT session today, citing back/groin/thigh pain as the biggest limiter with mobility, ambulation and exercises.  She needed extra time between movement, sets, etc and despite ostensibly being eager to do as much as possible she was ultimately quite limited.  Pt not only with increased pain with limited (<18ft) ambulation but also anxious with elevated HR (~130s) and drop in SpO2 (~90%) with subjective fatigue on top of her pain.  Pt showed good effort with LE exercises but was similarly limited by pain with these.  Pt has not made expected gains and continues to struggle with what would be basic in home mobility.  Pt will benefit from continued PT, POC changed to reflect limitations/slow progress.     If plan is discharge home, recommend the following: A little help with walking and/or transfers;A little help with bathing/dressing/bathroom;Assist for transportation;Assistance with cooking/housework   Can travel by private vehicle        Equipment Recommendations  None recommended by PT    Recommendations for Other Services       Precautions / Restrictions Precautions Precautions: Fall Restrictions Weight Bearing Restrictions Per Provider Order: No     Mobility  Bed Mobility Overal bed mobility: Needs Assistance Bed Mobility: Supine to Sit     Supine to sit: Min assist     General bed mobility comments: Pt able to maintain relatively neutral spine, needed HHA to pull up to sitting despite good effort to try on her own    Transfers Overall transfer level: Needs assistance Equipment  used: Rolling walker (2 wheels) Transfers: Sit to/from Stand Sit to Stand: Contact guard assist           General transfer comment: cuing for set up and hand placement, hesitant but able to rise w/o phyisical assist    Ambulation/Gait Ambulation/Gait assistance: Min assist Gait Distance (Feet): 8 Feet Assistive device: Rolling walker (2 wheels)         General Gait Details: Pt with very guarded, slow and walker reliant effort.  C/o severe pain (back, groins, thighs) and becomes very anxious needing to sit abruptly.  Vitals difficult to read but HR appeared to get to 140s and O2 dropped to ~90% on room air.  Pt initially wanting to do more but simply too fatigued and pain limited to try another bout of ambulation after rest break.   Stairs             Wheelchair Mobility     Tilt Bed    Modified Rankin (Stroke Patients Only)       Balance Overall balance assessment: Needs assistance Sitting-balance support: Feet supported Sitting balance-Leahy Scale: Fair     Standing balance support: Bilateral upper extremity supported Standing balance-Leahy Scale: Fair                              Cognition Arousal: Alert Behavior During Therapy: WFL for tasks assessed/performed Overall Cognitive Status: No family/caregiver present to determine baseline cognitive functioning  Exercises General Exercises - Lower Extremity Ankle Circles/Pumps: AROM, 10 reps Quad Sets: Strengthening, 10 reps Long Arc Quad: Strengthening, 5 reps Hip ABduction/ADduction: Strengthening, 5 reps Hip Flexion/Marching: Strengthening, 5 reps    General Comments General comments (skin integrity, edema, etc.): Pt pleasant and seemingly motivated but simply unable to tolerate any prolonged activity this date      Pertinent Vitals/Pain Pain Assessment Pain Assessment: 0-10 Pain Score: 8  Pain Location: back, thighs, groins     Home Living                          Prior Function            PT Goals (current goals can now be found in the care plan section) Acute Rehab PT Goals PT Goal Formulation: With patient Progress towards PT goals: Not progressing toward goals - comment (pt more limited with mobility today than prior sessions)    Frequency    Min 1X/week      PT Plan      Co-evaluation              AM-PAC PT "6 Clicks" Mobility   Outcome Measure  Help needed turning from your back to your side while in a flat bed without using bedrails?: A Little Help needed moving from lying on your back to sitting on the side of a flat bed without using bedrails?: A Little Help needed moving to and from a bed to a chair (including a wheelchair)?: A Little Help needed standing up from a chair using your arms (e.g., wheelchair or bedside chair)?: A Little Help needed to walk in hospital room?: A Lot Help needed climbing 3-5 steps with a railing? : Total 6 Click Score: 15    End of Session Equipment Utilized During Treatment: Gait belt Activity Tolerance: Patient tolerated treatment well Patient left: in bed;with call bell/phone within reach;with bed alarm set Nurse Communication: Mobility status PT Visit Diagnosis: Other abnormalities of gait and mobility (R26.89);Muscle weakness (generalized) (M62.81)     Time: 4098-1191 PT Time Calculation (min) (ACUTE ONLY): 24 min  Charges:    $Therapeutic Exercise: 8-22 mins $Therapeutic Activity: 8-22 mins PT General Charges $$ ACUTE PT VISIT: 1 Visit                     Malachi Pro, DPT 04/22/2023, 10:09 AM

## 2023-04-22 NOTE — Progress Notes (Signed)
Date of Admission:  04/16/2023     ID: MAHIKA VANVOORHIS is a 68 y.o. female  Principal Problem:   Sepsis (HCC) Active Problems:   Migraine   Acid reflux   Restless leg   Microcytic anemia   Asthma   Anxiety and depression   T2DM (type 2 diabetes mellitus) (HCC)   Fever   Chronic pain syndrome   Falls   Electrolyte abnormality   MSSA bacteremia   Surgical site infection  ROYALE LENNARTZ is a 68 y.o. with a history of cervical myelpathy for which she  underwent c3-c6 laminectomy and posterior fusion on 03/02/23, COPD, barrettts esophagus, hiatus hernia, HTN, followed by neurosurgery as OP and last seen by them on 04/12/23 with findings of overall wound healing well presented to the ED thru EMS from home on 04/16/23 after fall at home- as per the chart she was trying to reach for ehr walker and slipped and fell and could not get off the floor- 2 days prior to that she had slipped from her chair- she did not c/o fever or chills She had neck pain which was present since before surgery and not changed in intensity recently  Sister in law at bed side Subjective: Walked in the room Some groin tightness And leg pain Medications:   acetaminophen  650 mg Oral Q4H   busPIRone  10 mg Oral BID   celecoxib  100 mg Oral BID   Chlorhexidine Gluconate Cloth  6 each Topical Daily   DULoxetine  60 mg Oral Daily   enoxaparin (LOVENOX) injection  40 mg Subcutaneous Q24H   Fe Fum-Vit C-Vit B12-FA  1 capsule Oral BID   fluticasone furoate-vilanterol  1 puff Inhalation Daily   gabapentin  900 mg Oral BID   methocarbamol  750 mg Oral TID   montelukast  10 mg Oral QHS   pantoprazole  20 mg Oral Daily   rOPINIRole  1 mg Oral QHS   sodium chloride flush  10-40 mL Intracatheter Q12H   sodium chloride flush  3 mL Intravenous Q12H   traZODone  100 mg Oral QHS    Objective: Vital signs in last 24 hours: Patient Vitals for the past 24 hrs:  BP Temp Pulse Resp SpO2  04/22/23 0717 128/67 98.7 F  (37.1 C) 95 16 97 %  04/22/23 0245 122/65 98.4 F (36.9 C) 95 18 95 %  04/21/23 1949 129/71 98.7 F (37.1 C) 95 18 98 %  04/21/23 1535 121/61 98.4 F (36.9 C) 93 18 97 %      PHYSICAL EXAM:  General: Alert, cooperative,  Neck: cervical area- surgical site open, staples present - drain removed Lungs: b/l air entry Heart: Regular rate and rhythm, no murmur, rub or gallop. Abdomen: Soft, non-tender,not distended. Bowel sounds normal. No masses Extremities: b/l knee scars Skin: No rashes or lesions. Or bruising Lymph: Cervical, supraclavicular normal. Neurologic: Grossly non-focal  Lab Results    Latest Ref Rng & Units 04/21/2023    5:06 AM 04/20/2023    5:01 AM 04/18/2023    6:53 AM  CBC  WBC 4.0 - 10.5 K/uL 15.0  14.7  11.6   Hemoglobin 12.0 - 15.0 g/dL 8.5  9.0  8.4   Hematocrit 36.0 - 46.0 % 25.6  27.6  26.0   Platelets 150 - 400 K/uL 513  432  323        Latest Ref Rng & Units 04/21/2023    5:06 AM 04/20/2023  5:01 AM 04/18/2023    6:53 AM  CMP  Glucose 70 - 99 mg/dL 92  696  295   BUN 8 - 23 mg/dL 12  11  24    Creatinine 0.44 - 1.00 mg/dL 2.84  1.32  4.40   Sodium 135 - 145 mmol/L 138  134  131   Potassium 3.5 - 5.1 mmol/L 3.2  4.9  4.8   Chloride 98 - 111 mmol/L 98  98  102   CO2 22 - 32 mmol/L 30  25  22    Calcium 8.9 - 10.3 mg/dL 8.0  7.9  8.0       Microbiology: 04/16/23 BC MSSA both sets 04/17/23 surgical wound culture MSSA 04/18/23 BC- NG Studies/Results: No results found.    Assessment/Plan: MSSA bacteremia  due to cervical spine surgical site infection S/p I/D - culture staph aureues Continue cefazolin Repeat blood culture from 1/26 neg so far TEE will not change the management or duration as she will need 6 weeks of IV antibiotic followed by PO for another 6 weeks She cannot  handle doing  IV at home  Will need rehab     Recent c3-c6 laminectomy and posterior fusion on 03/02/23 MRI showed superficial and deep abscess- underwent I/D and  culture is MSSA Continue cefazolin for 6 weeks followed by PO for 6 more weeks Because of hardware will add rifampin 300mg  Po BID   Anemia   Hiatus hernia h/o fundoplication Dysphagia has had esophageal dilatation Nov 2024 Biopsy no evidence of metaplasia ? Anxiety  on meds Discussed the management with patient and her sister in law

## 2023-04-23 DIAGNOSIS — T8149XA Infection following a procedure, other surgical site, initial encounter: Secondary | ICD-10-CM | POA: Diagnosis not present

## 2023-04-23 DIAGNOSIS — R6520 Severe sepsis without septic shock: Secondary | ICD-10-CM | POA: Diagnosis not present

## 2023-04-23 DIAGNOSIS — R7881 Bacteremia: Secondary | ICD-10-CM | POA: Diagnosis not present

## 2023-04-23 DIAGNOSIS — A4101 Sepsis due to Methicillin susceptible Staphylococcus aureus: Secondary | ICD-10-CM | POA: Diagnosis not present

## 2023-04-23 DIAGNOSIS — B9561 Methicillin susceptible Staphylococcus aureus infection as the cause of diseases classified elsewhere: Secondary | ICD-10-CM | POA: Diagnosis not present

## 2023-04-23 LAB — CBC
HCT: 26.2 % — ABNORMAL LOW (ref 36.0–46.0)
Hemoglobin: 8.4 g/dL — ABNORMAL LOW (ref 12.0–15.0)
MCH: 23.4 pg — ABNORMAL LOW (ref 26.0–34.0)
MCHC: 32.1 g/dL (ref 30.0–36.0)
MCV: 73 fL — ABNORMAL LOW (ref 80.0–100.0)
Platelets: 489 10*3/uL — ABNORMAL HIGH (ref 150–400)
RBC: 3.59 MIL/uL — ABNORMAL LOW (ref 3.87–5.11)
RDW: 23.5 % — ABNORMAL HIGH (ref 11.5–15.5)
WBC: 14.9 10*3/uL — ABNORMAL HIGH (ref 4.0–10.5)
nRBC: 0 % (ref 0.0–0.2)

## 2023-04-23 LAB — BASIC METABOLIC PANEL
Anion gap: 11 (ref 5–15)
BUN: 14 mg/dL (ref 8–23)
CO2: 31 mmol/L (ref 22–32)
Calcium: 8 mg/dL — ABNORMAL LOW (ref 8.9–10.3)
Chloride: 97 mmol/L — ABNORMAL LOW (ref 98–111)
Creatinine, Ser: 0.44 mg/dL (ref 0.44–1.00)
GFR, Estimated: >60 mL/min (ref >60)
Glucose, Bld: 95 mg/dL (ref 70–99)
Potassium: 3.6 mmol/L (ref 3.5–5.1)
Sodium: 139 mmol/L (ref 135–145)

## 2023-04-23 LAB — CULTURE, BLOOD (ROUTINE X 2)
Culture: NO GROWTH
Culture: NO GROWTH
Special Requests: ADEQUATE
Special Requests: ADEQUATE

## 2023-04-23 MED ORDER — DICLOFENAC SODIUM 1 % EX GEL
2.0000 g | Freq: Four times a day (QID) | CUTANEOUS | Status: DC
  Administered 2023-04-23 – 2023-04-28 (×10): 2 g via TOPICAL
  Filled 2023-04-23: qty 100

## 2023-04-23 NOTE — Progress Notes (Signed)
Progress Note Patient: Summer Murphy JYN:[id number] DOB: [birth year] DOA: 04/16/2023     6 DOS: the patient was seen and examined on 04/23/2023   Brief hospital course: Summer Murphy is a 68 y.o. female with medical history significant of type 2 diabetes, asthma, arthritis s/p cervical fusion and anxiety came to ED after having a fall.  ED course: febrile at 101.1 and tachycardic, labs with no leukocytosis, hemoglobin 10.7 which is at her baseline, sodium at 132, albumin 2.5, CK29, lactic acid 1, magnesium 1.5, procalcitonin 0.40. UA and urine cultures are pending, Blood cultures were drawn.   Multiple imaging which include CT head, CT cervical spine, DG knee, DG hip and pelvis and CT scan of abdomen and pelvis was negative for any acute abnormality.   On exam she has a small indurated spot at the base of her cervical fusion incision with point tenderness, MRI was ordered and it shows some fluid collection, abscess cannot be ruled out.   1/25: Afebrile this morning, blood cultures growing Staph aureus-pending susceptibility.  Hemoglobin decreased to 7.3 without any obvious bleeding. Type and screen was done and it shows positive antibody-ordered 1 unit of PRBC and also consulted neurosurgery as there is no other obvious source of infection.  Started on cefazolin as there is no resistance detected. Patient now meets the criteria for sepsis with fever and tachycardia, Staph aureus bacteremia.  Echocardiogram also ordered. Neurosurgery is going to take her to the OR for washout.   1/26: Preliminary postprocedural culture with no organism, anemia panel consistent with anemia of chronic disease and iron deficiency.  Started on supplement. Repeat blood cultures ordered.  Urine cultures with no growth   1/27: Repeat blood cultures with no growth in 24-hour, neurosurgery obtained lumbar MRI as she was complaining of lower back and bilateral leg pain, it was motion degraded study but did show  multilevel degenerative changes and neural foraminal narrowing. Cervical wound culture with some Staph aureus.  TTE with EF of 45 to 50%, some concern of regional wall motion abnormalities likely due to LBBB, discussed with cardiology and they are recommending outpatient follow-up.  Appointment was set with Dr. Mariah Milling in February. Pending final recommendations from ID.   1/28: Hemodynamically stable, continue to have lower back and leg pain.  Cervical drain remain in place.  ID is recommending at least 6 weeks of IV cefazolin followed by another 4 to 6 weeks of p.o. antibiotics.  PICC line was placed today.  Repeat blood cultures negative for 48 hours. 1/29: drain removed.  1/30-1/31: patient doing well. PT updated recs to SNF at dc. TOC informed. She is cleared to dc when bed available. Continue pain control, IV abx course as recommended by ID and mobilize patient as much as possible. Staple removal by neurosurgery 14 days post-op. Healing well.   Assessment and Plan: Sepsis (HCC) MSSA bacteremia. Patient met sepsis criteria with fever and tachycardia, blood cultures growing MSSA MRI of cervical spine with concern of a fluid collection along posterior incision for recent cervical fusion injury, infection or abscess cannot be excluded.  Neurosurgery was consulted. S/p washout in OR,  cultures with MSSA Echocardiogram with EF of 40 to 45%, some concern of regional wall motion abnormalities which are likely due to LBBB, cardiology is recommending outpatient follow-up Repeat blood cultures remain negative in 48 hours so PICC line was placed. -Continue cefazolin per pharmacy-will need at least 6 weeks of IV cefazolin followed by p.o. for additional 6 weeks -ID is  on board  S/p cervical posterior decompression and fusion- about 6 weeks ago. Washout on 1/25.  - neurosurgery following for drain removal 1/29. - PT/OT - staple removal 14 days post-op  Falls- no acute changes on extensive imaging on  presentation.  MRI lumbar spine with multilevel degenerative changes and no acute abnormality -PT and OT   T2DM (type 2 diabetes mellitus) (HCC) Patient uses Mounjaro at home. - not requiring any insulin from sliding scale. Will dc the scale and CBG checks. She's stable on diet alone  Chronic pain syndrome- pain mostly in hip joints and knees.  History of arthritis. - needs to mobilize as much as possible -Continue home Celebrex and Neurontin -continue noroc  Asthma No upper respiratory symptoms or concern of exacerbation. -Continue home inhalers  Restless leg -Continue home Requip  Anxiety and depression -Continue home BuSpar and Cymbalta  Migraine No acute concern. -Continue home Imitrex as needed  Acid reflux -Continue home PPI  Electrolyte abnormality Hyponatremia improving, sodium at 134, hypomagnesemia and hypokalemia resolved -Continue to monitor - potassium and magnesium repleted  Microcytic anemia Patient with history of iron deficiency anemia, hemoglobin decreased to 7.3 s/p 1 unit of PRBC, currently hemoglobin stable at 8.4>>8.5 -Starting on supplement -Monitor hemoglobin -Check FOBT   Subjective: Patient reports feeling well today. Continues to have hip and knee pain. No neck pain.   Physical Exam: Vitals:   04/22/23 0717 04/22/23 1806 04/22/23 1958 04/23/23 0325  BP: 128/67 (!) 115/93 134/87 (!) 148/80  Pulse: 95 100 94 96  Resp: 16  18 16   Temp: 98.7 F (37.1 C) 98.5 F (36.9 C) 98.8 F (37.1 C) 98.2 F (36.8 C)  TempSrc:    Oral  SpO2: 97% 98% 98% 96%  Weight:      Height:       General.  Well-developed. Appears comfortable. Pulmonary.  Lungs clear bilaterally, normal respiratory effort. CV.  Regular rate and rhythm, no JVD, rub or murmur. Abdomen.  Soft, nontender, nondistended, BS positive. CNS.  Alert and oriented .  No focal neurologic deficit. Extremities.  No edema, no cyanosis, pulses intact and symmetrical. PICC in R UE. Staples  on back of neck without drainage or signs of infection Psychiatry.  Judgment and insight appears normal.   Data Reviewed: Prior data reviewed  Family Communication: sister in law at bedside  Disposition: Status is: Inpatient Remains inpatient appropriate because: Severity of illness  Planned Discharge Destination: Home with home health  DVT prophylaxis.  Lovenox Time spent: 45 minutes   Author: Leeroy Bock, MD 04/23/2023 7:16 AM  For on call review [email or web address].

## 2023-04-23 NOTE — Progress Notes (Signed)
Date of Admission:  04/16/2023     ID: Summer Murphy is a 68 y.o. female  Principal Problem:   Sepsis (HCC) Active Problems:   Migraine   Acid reflux   Restless leg   Microcytic anemia   Asthma   Anxiety and depression   T2DM (type 2 diabetes mellitus) (HCC)   Fever   Chronic pain syndrome   Falls   Electrolyte abnormality   MSSA bacteremia   Surgical site infection  Summer Murphy is a 68 y.o. with a history of cervical myelpathy for which she  underwent c3-c6 laminectomy and posterior fusion on 03/02/23, COPD, barrettts esophagus, hiatus hernia, HTN, followed by neurosurgery as OP and last seen by them on 04/12/23 with findings of overall wound healing well presented to the ED thru EMS from home on 04/16/23 after fall at home- as per the chart she was trying to reach for ehr walker and slipped and fell and could not get off the floor- 2 days prior to that she had slipped from her chair- she did not c/o fever or chills She had neck pain which was present since before surgery and not changed in intensity recently   Subjective: Feeling better   acetaminophen  650 mg Summer Q4H   busPIRone  10 mg Summer BID   celecoxib  100 mg Summer BID   Chlorhexidine Gluconate Cloth  6 each Topical Daily   DULoxetine  60 mg Summer Daily   enoxaparin (LOVENOX) injection  40 mg Subcutaneous Q24H   Fe Fum-Vit C-Vit B12-FA  1 capsule Summer BID   fluticasone furoate-vilanterol  1 puff Inhalation Daily   gabapentin  900 mg Summer BID   methocarbamol  750 mg Summer TID   montelukast  10 mg Summer QHS   pantoprazole  20 mg Summer Daily   rifampin  300 mg Summer Q12H   rOPINIRole  1 mg Summer QHS   sodium chloride flush  10-40 mL Intracatheter Q12H   sodium chloride flush  3 mL Intravenous Q12H   traZODone  100 mg Summer QHS    Objective: Vital signs in last 24 hours: Patient Vitals for the past 24 hrs:  BP Temp Temp src Pulse Resp SpO2  04/23/23 0840 121/61 98.6 F (37 C) -- 91 18 99 %  04/23/23 0325  (!) 148/80 98.2 F (36.8 C) Summer 96 16 96 %  04/22/23 1958 134/87 98.8 F (37.1 C) -- 94 18 98 %  04/22/23 1806 (!) 115/93 98.5 F (36.9 C) -- 100 -- 98 %      PHYSICAL EXAM:  General: Alert, cooperative,  Neck: cervical area- surgical site open, staples present - drain removed Lungs: b/l air entry Heart: Regular rate and rhythm, no murmur, rub or gallop. Abdomen: Soft, non-tender,not distended. Bowel sounds normal. No masses Extremities: b/l knee scars Skin: No rashes or lesions. Or bruising Lymph: Cervical, supraclavicular normal. Neurologic: Grossly non-focal  Lab Results    Latest Ref Rng & Units 04/23/2023    5:45 AM 04/21/2023    5:06 AM 04/20/2023    5:01 AM  CBC  WBC 4.0 - 10.5 K/uL 14.9  15.0  14.7   Hemoglobin 12.0 - 15.0 g/dL 8.4  8.5  9.0   Hematocrit 36.0 - 46.0 % 26.2  25.6  27.6   Platelets 150 - 400 K/uL 489  513  432        Latest Ref Rng & Units 04/23/2023    5:45 AM 04/21/2023  5:06 AM 04/20/2023    5:01 AM  CMP  Glucose 70 - 99 mg/dL 95  92  604   BUN 8 - 23 mg/dL 14  12  11    Creatinine 0.44 - 1.00 mg/dL 5.40  9.81  1.91   Sodium 135 - 145 mmol/L 139  138  134   Potassium 3.5 - 5.1 mmol/L 3.6  3.2  4.9   Chloride 98 - 111 mmol/L 97  98  98   CO2 22 - 32 mmol/L 31  30  25    Calcium 8.9 - 10.3 mg/dL 8.0  8.0  7.9       Microbiology: 04/16/23 BC MSSA both sets 04/17/23 surgical wound culture MSSA 04/18/23 BC- NG Studies/Results: No results found.    Assessment/Plan: MSSA bacteremia  due to cervical spine surgical site infection S/p I/D - culture staph aureues Continue cefazolin, and rifampin Repeat blood culture from 1/26 neg so far TEE will not change the management or duration as she will need 6 weeks of IV antibiotic followed by PO for another 6 weeks She cannot  handle doing  IV at home  Will need rehab     Recent c3-c6 laminectomy and posterior fusion on 03/02/23 MRI showed superficial and deep abscess- underwent I/D and culture is  MSSA Continue cefazolin for 6 weeks followed by PO for 6 more weeks Because of hardware added rifampin 300mg  Po BID   Anemia   Hiatus hernia h/o fundoplication Dysphagia has had esophageal dilatation Nov 2024 Biopsy no evidence of metaplasia ? Anxiety  on meds Discussed the management with patient and care team ID will not routinely see her this weekend On call ID available by phone for urgent issues

## 2023-04-23 NOTE — Plan of Care (Signed)

## 2023-04-23 NOTE — Progress Notes (Signed)
Mobility Specialist - Progress Note     04/23/23 1321  Mobility  Activity Ambulated with assistance to bathroom  Level of Assistance Standby assist, set-up cues, supervision of patient - no hands on  Assistive Device Front wheel walker  Distance Ambulated (ft) 20 ft  Range of Motion/Exercises Active  Activity Response Tolerated well  Mobility Referral Yes  Mobility visit 1 Mobility  Mobility Specialist Start Time (ACUTE ONLY) 1130  Mobility Specialist Stop Time (ACUTE ONLY) 1145  Mobility Specialist Time Calculation (min) (ACUTE ONLY) 15 min   Pt resting in bed on RA upon entry. Pt STS and ambulates to bathroom SBA with RW. Pt returned to bed and left with needs in reach. Pt bed alarm activated.   Johnathan Hausen Mobility Specialist 04/23/23, 1:26 PM

## 2023-04-23 NOTE — TOC Progression Note (Signed)
Transition of Care [facility]) - Progression Note    Patient Details  Name: Summer Murphy MRN: [id number] Date of Birth: 1955-08-04  Transition of [facility] CM/SW Contact  Allena Katz, LCSW Phone Number: 04/23/2023, 1:34 PM  Clinical Narrative:    CSW spoke with patient who reports they have chosen [facility] to start auth.        Expected Discharge Plan and Services                                               Social Determinants of Health (SDOH) Interventions SDOH Screenings   Food Insecurity: No Food Insecurity (04/18/2023)  Housing: Low Risk  (04/18/2023)  Transportation Needs: No Transportation Needs (04/18/2023)  Utilities: Not At Risk (04/16/2023)  Alcohol Screen: Low Risk  (12/31/2021)  Depression (PHQ2-9): Low Risk  (01/27/2023)  Social Connections: Moderately Isolated (04/16/2023)  Tobacco Use: Low Risk  (04/16/2023)    Readmission Risk Interventions     No data to display

## 2023-04-24 ENCOUNTER — Inpatient Hospital Stay: Payer: 59

## 2023-04-24 DIAGNOSIS — K6812 Psoas muscle abscess: Secondary | ICD-10-CM

## 2023-04-24 LAB — PROTIME-INR
INR: 1.4 — ABNORMAL HIGH (ref 0.8–1.2)
Prothrombin Time: 17.5 s — ABNORMAL HIGH (ref 11.4–15.2)

## 2023-04-24 LAB — CBC
HCT: 27 % — ABNORMAL LOW (ref 36.0–46.0)
Hemoglobin: 8.6 g/dL — ABNORMAL LOW (ref 12.0–15.0)
MCH: 22.8 pg — ABNORMAL LOW (ref 26.0–34.0)
MCHC: 31.9 g/dL (ref 30.0–36.0)
MCV: 71.4 fL — ABNORMAL LOW (ref 80.0–100.0)
Platelets: 515 10*3/uL — ABNORMAL HIGH (ref 150–400)
RBC: 3.78 MIL/uL — ABNORMAL LOW (ref 3.87–5.11)
RDW: 23.6 % — ABNORMAL HIGH (ref 11.5–15.5)
WBC: 14.4 10*3/uL — ABNORMAL HIGH (ref 4.0–10.5)
nRBC: 0 % (ref 0.0–0.2)

## 2023-04-24 LAB — BASIC METABOLIC PANEL
Anion gap: 12 (ref 5–15)
BUN: 12 mg/dL (ref 8–23)
CO2: 29 mmol/L (ref 22–32)
Calcium: 8.2 mg/dL — ABNORMAL LOW (ref 8.9–10.3)
Chloride: 94 mmol/L — ABNORMAL LOW (ref 98–111)
Creatinine, Ser: 0.44 mg/dL (ref 0.44–1.00)
GFR, Estimated: >60 mL/min (ref >60)
Glucose, Bld: 96 mg/dL (ref 70–99)
Potassium: 3.5 mmol/L (ref 3.5–5.1)
Sodium: 135 mmol/L (ref 135–145)

## 2023-04-24 LAB — HEPARIN LEVEL (UNFRACTIONATED): Heparin Unfractionated: <0.1 [IU]/mL — ABNORMAL LOW (ref 0.30–0.70)

## 2023-04-24 LAB — APTT: aPTT: 49 s — ABNORMAL HIGH (ref 24–36)

## 2023-04-24 MED ORDER — SODIUM CHLORIDE 0.9 % IV SOLN
INTRAVENOUS | Status: AC
Start: 2023-04-24 — End: 2023-04-24

## 2023-04-24 MED ORDER — HYDROMORPHONE HCL 1 MG/ML IJ SOLN
INTRAMUSCULAR | Status: AC
  Filled 2023-04-24: qty 0.5

## 2023-04-24 MED ORDER — OXYCODONE HCL 5 MG PO TABS
5.0000 mg | ORAL_TABLET | Freq: Four times a day (QID) | ORAL | Status: AC
  Administered 2023-04-24 – 2023-04-26 (×8): 5 mg via ORAL
  Filled 2023-04-24 (×8): qty 1

## 2023-04-24 MED ORDER — HYDROMORPHONE HCL 1 MG/ML IJ SOLN: 0.5000 mg | INTRAMUSCULAR | Status: AC

## 2023-04-24 MED ORDER — HEPARIN BOLUS VIA INFUSION
1700.0000 [IU] | Freq: Once | INTRAVENOUS | Status: AC
  Administered 2023-04-25: 1700 [IU] via INTRAVENOUS
  Filled 2023-04-24: qty 1700

## 2023-04-24 MED ORDER — IOHEXOL 300 MG/ML  SOLN
80.0000 mL | Freq: Once | INTRAMUSCULAR | Status: AC | PRN
  Administered 2023-04-24: 80 mL via INTRAVENOUS

## 2023-04-24 MED ORDER — HYDROMORPHONE HCL 1 MG/ML IJ SOLN
1.0000 mg | INTRAMUSCULAR | Status: DC | PRN
  Administered 2023-04-24 – 2023-04-25 (×5): 1 mg via INTRAVENOUS
  Filled 2023-04-24 (×6): qty 1

## 2023-04-24 MED ORDER — HYDROMORPHONE HCL 1 MG/ML IJ SOLN
0.5000 mg | INTRAMUSCULAR | Status: DC | PRN
  Filled 2023-04-24: qty 0.5

## 2023-04-24 MED ORDER — HYDROMORPHONE HCL 1 MG/ML IJ SOLN
0.5000 mg | Freq: Once | INTRAMUSCULAR | Status: AC
  Administered 2023-04-24: 0.5 mg via INTRAVENOUS

## 2023-04-24 MED ORDER — HEPARIN BOLUS VIA INFUSION
3400.0000 [IU] | Freq: Once | INTRAVENOUS | Status: AC
  Administered 2023-04-24: 3400 [IU] via INTRAVENOUS
  Filled 2023-04-24: qty 3400

## 2023-04-24 MED ORDER — HEPARIN (PORCINE) 25000 UT/250ML-% IV SOLN
900.0000 [IU]/h | INTRAVENOUS | Status: DC
  Administered 2023-04-24: 900 [IU]/h via INTRAVENOUS
  Filled 2023-04-24: qty 250

## 2023-04-24 NOTE — Progress Notes (Signed)
   04/24/23 0900  Spiritual Encounters  Type of Visit Initial  Referral source Code page  Reason for visit Code  OnCall Visit Yes   Chaplain responded to RR code. Patient being cared for. Family present however Chaplain unable to visit at this time. Chaplain services available upon request.

## 2023-04-24 NOTE — Plan of Care (Signed)

## 2023-04-24 NOTE — Progress Notes (Signed)
 Patient received from procedure via bed in stable condition.

## 2023-04-24 NOTE — Progress Notes (Signed)
Patient sent for procedure via bed in stable condition.

## 2023-04-24 NOTE — Progress Notes (Addendum)
PHARMACY - ANTICOAGULATION CONSULT NOTE  Pharmacy Consult for Heparin Infusion Indication:  VTE Treatment, nonocclusive IVC thrombus  Allergies  Allergen Reactions   Librium [Chlordiazepoxide] Shortness Of Breath   Reglan [Metoclopramide] Hives and Other (See Comments)    hallucinations    Vanilla Shortness Of Breath    Pt reports allergy to vanilla extract only   Aspirin Hives   Nsaids Rash    Rash/flares asthma issues.   Azithromycin Other (See Comments)    Unsure of what the reaction was.   Buprenorphine Hcl Other (See Comments)    Unsure of what the reaction was.    Flexeril [Cyclobenzaprine]     hallucinations   Morphine Other (See Comments)    Unsure of reaction   Sulfa Antibiotics     Other reaction(s): Unknown   Tolmetin     Other Reaction: Allergy   Amlodipine Besylate Itching and Rash    arms, stomach and forehead   Iron Nausea And Vomiting    Patient Measurements: Height: 5' 2.99" (160 cm) Weight: 56.8 kg (125 lb 3.5 oz) IBW/kg (Calculated) : 52.38 Heparin Dosing Weight: 56.8 kg  Vital Signs: Temp: 98.4 F (36.9 C) (02/01 1523) BP: 125/81 (02/01 1523) Pulse Rate: 103 (02/01 1523)  Labs: Recent Labs    04/23/23 0545 04/24/23 0454 04/24/23 1534 04/24/23 2213  HGB 8.4* 8.6*  --   --   HCT 26.2* 27.0*  --   --   PLT 489* 515*  --   --   APTT  --   --  49*  --   LABPROT  --   --  17.5*  --   INR  --   --  1.4*  --   HEPARINUNFRC  --   --   --  <0.10*  CREATININE 0.44 0.44  --   --     Estimated Creatinine Clearance: 56.4 mL/min (by C-G formula based on SCr of 0.44 mg/dL).   Medical History: Past Medical History:  Diagnosis Date   Acid reflux    Anemia    Anxiety    a.) buspirone BID + BZO PRN (clonazepam)   Arthritis    Asthma    Cervical disc disorder with radiculopathy of cervical region    Chronic pain syndrome    a.) on COT as prescribed by pain mangement   Chronic, continuous use of opioids    a.) followed by pain mangement    COPD (chronic obstructive pulmonary disease) (HCC)    DOE (dyspnea on exertion)    Hematuria    Hiatal hernia    History of kidney stones    Hypertension    Hypoglycemia    Insomnia    a.) uses Trazodone PRN   LBBB (left bundle branch block)    Migraine    Murmur    Osteopenia    Palpitations    Pre-diabetes    Restless leg    a.) on ropinirole   Stenosis of cervical spine with myelopathy (HCC)    T2DM (type 2 diabetes mellitus) (HCC)     Medications:  Previously on enoxaparin 40 mg SQ q24h for DVT ppx- last dose 1/31 @ 2043  Assessment: Patient is a 68 year old female currently being treated for MSSA bacteremia and abscess/discitis at cervical fusion site. She is s/p drainage and was started on cefazolin and rifampin. CT abdomen/pelvis today showed nonocclusive thrombus in IVC. After discussion with vascular, MD decided to start heparin infusion and continue antibiotics in case it is an infected  thrombus. Pharmacy was consulted to initiate patient on a heparin infusion.   Baseline INR and aPTT ordered.  No signs/symptoms of bleeding noted in chart. Hgb 8.6 (stable). PLT 515.  Per MD, vascular plans to take patient to surgery tomorrow. Will go ahead and start heparin now with the plan to stop heparin 2/2 @ 0700  Goal of Therapy:  Heparin level 0.3-0.7 units/ml Monitor platelets by anticoagulation protocol: Yes   Plan:  2/1:  HL @ 2213 = < 0.1, SUBtherapeutic - RN says infusion pump is having to be continually restarted so multiple interruptions in heparin gtt  - Will order heparin 1700 units IV X 1 bolus but continue pt on current rate of 900 units/hr Check heparin level in 6 hours on 2/2 @ 0600 Plan to d/c heparin on 2/2 @ 0700 Monitor CBC daily   Renell Allum D, PharmD Clinical Pharmacist  04/24/2023,11:06 PM

## 2023-04-24 NOTE — Progress Notes (Signed)
PHARMACY - ANTICOAGULATION CONSULT NOTE  Pharmacy Consult for Heparin Infusion Indication:  VTE Treatment, nonocclusive IVC thrombus  Allergies  Allergen Reactions   Librium [Chlordiazepoxide] Shortness Of Breath   Reglan [Metoclopramide] Hives and Other (See Comments)    hallucinations    Vanilla Shortness Of Breath    Pt reports allergy to vanilla extract only   Aspirin Hives   Nsaids Rash    Rash/flares asthma issues.   Azithromycin Other (See Comments)    Unsure of what the reaction was.   Buprenorphine Hcl Other (See Comments)    Unsure of what the reaction was.    Flexeril [Cyclobenzaprine]     hallucinations   Morphine Other (See Comments)    Unsure of reaction   Sulfa Antibiotics     Other reaction(s): Unknown   Tolmetin     Other Reaction: Allergy   Amlodipine Besylate Itching and Rash    arms, stomach and forehead   Iron Nausea And Vomiting    Patient Measurements: Height: 5\' 3"  (160 cm) Weight: 56.8 kg (125 lb 3.5 oz) IBW/kg (Calculated) : 52.4 Heparin Dosing Weight: 56.8 kg  Vital Signs: Temp: 98 F (36.7 C) (02/01 0951) Temp Source: Oral (02/01 0951) BP: 123/101 (02/01 1020) Pulse Rate: 100 (02/01 1020)  Labs: Recent Labs    04/23/23 0545 04/24/23 0454  HGB 8.4* 8.6*  HCT 26.2* 27.0*  PLT 489* 515*  CREATININE 0.44 0.44    Estimated Creatinine Clearance: 56.4 mL/min (by C-G formula based on SCr of 0.44 mg/dL).   Medical History: Past Medical History:  Diagnosis Date   Acid reflux    Anemia    Anxiety    a.) buspirone BID + BZO PRN (clonazepam)   Arthritis    Asthma    Cervical disc disorder with radiculopathy of cervical region    Chronic pain syndrome    a.) on COT as prescribed by pain mangement   Chronic, continuous use of opioids    a.) followed by pain mangement   COPD (chronic obstructive pulmonary disease) (HCC)    DOE (dyspnea on exertion)    Hematuria    Hiatal hernia    History of kidney stones    Hypertension     Hypoglycemia    Insomnia    a.) uses Trazodone PRN   LBBB (left bundle branch block)    Migraine    Murmur    Osteopenia    Palpitations    Pre-diabetes    Restless leg    a.) on ropinirole   Stenosis of cervical spine with myelopathy (HCC)    T2DM (type 2 diabetes mellitus) (HCC)     Medications:  Previously on enoxaparin 40 mg SQ q24h for DVT ppx- last dose 1/31 @ 2043  Assessment: Patient is a 68 year old female currently being treated for MSSA bacteremia and abscess/discitis at cervical fusion site. She is s/p drainage and was started on cefazolin and rifampin. CT abdomen/pelvis today showed nonocclusive thrombus in IVC. After discussion with vascular, MD decided to start heparin infusion and continue antibiotics in case it is an infected thrombus. Pharmacy was consulted to initiate patient on a heparin infusion.   Baseline INR and aPTT ordered.  No signs/symptoms of bleeding noted in chart. Hgb 8.6 (stable). PLT 515.  Per MD, vascular plans to take patient to surgery tomorrow. Will go ahead and start heparin now with the plan to stop heparin 2/2 @ 0700  Goal of Therapy:  Heparin level 0.3-0.7 units/ml Monitor platelets by anticoagulation  protocol: Yes   Plan:  Per MD, vascular plans to take patient to surgery tomorrow. Will go ahead and start heparin now with the plan to stop heparin 2/2 @ 0700 Give 3400 unit bolus x 1 Start heparin infusion at 900 units/hr  Check heparin level in 6 hours  Monitor CBC daily   Merryl Hacker, PharmD Clinical Pharmacist  04/24/2023,3:08 PM

## 2023-04-24 NOTE — Progress Notes (Addendum)
Patient is crying and saying lots of pain in her right hip/groin area. Oxycodone given as order. Called rapid response team.  Inj.Dilaudid and  tab.klonopin given as order. Received new order for CT rt hip. Plan of care ongoing.

## 2023-04-24 NOTE — TOC Progression Note (Signed)
Transition of Care [facility]) - Progression Note    Patient Details  Name: Summer Murphy MRN: [id number] Date of Birth: 17-Jan-1956  Transition of Care [facility]) CM/SW Contact  Bing Quarry, RN Phone Number: 04/24/2023, 1:21 PM  Clinical Narrative:  2/1: Patient was pending discharge to [facility]. Rapid response called on the patient this am at 0922 for intractable pain issues. Per provider note: "Patient may need IR drainage of what looks like bilateral psoas abscesses. Continue cefazolin and rifampin for discitis-osteomyelitis of L3-4". TOC will continue to follow.    Gabriel Cirri MSN RN CM  RN Case Manager [place]  Transitions of Care Direct Dial: [phone] (Weekends Only) [facility] Main Office Phone: [phone] [facility] Fax: [phone] [email or web address]          Expected Discharge Plan and Services                                               Social Determinants of Health (SDOH) Interventions SDOH Screenings   Food Insecurity: No Food Insecurity (04/18/2023)  Housing: Low Risk  (04/18/2023)  Transportation Needs: No Transportation Needs (04/18/2023)  Utilities: Not At Risk (04/16/2023)  Alcohol Screen: Low Risk  (12/31/2021)  Depression (PHQ2-9): Low Risk  (01/27/2023)  Social Connections: Moderately Isolated (04/16/2023)  Tobacco Use: Low Risk  (04/16/2023)    Readmission Risk Interventions     No data to display

## 2023-04-24 NOTE — Significant Event (Signed)
Rapid Response Event Note   Reason for Call : Acute lower right abd/hip pain   Initial Focused Assessment: laying in bed, can't keep still, calling out in pain. Dr Luberta Robertson trying to assess patient. See flowsheets for VS      Interventions: CT right hip, Dilaudid once, and reorder anxiety meds.   Plan of Care: as above, Financial trader and Debbie, Consulting civil engineer to call for further assist.    Event Summary: as above  MD Notified: Luberta Robertson [phone] Call [phone] Arrival [phone] End [id number]  Nayson Traweek A, RN

## 2023-04-24 NOTE — Progress Notes (Signed)
PT Cancellation Note  Patient Details Name: Summer Murphy MRN: [id number] DOB: Feb 06, 1956   Cancelled Treatment:    Reason Eval/Treat Not Completed: Other (comment): Chart Reviewed. Patient with rapid response this AM. Spoke with RN, requesting to re attempt later date/time due to uncontrolled pain at this time. Will re-attempt at later date/time as medically appropriate.    Howie Ill, PT, DPT 04/24/23 12:47 PM

## 2023-04-24 NOTE — Plan of Care (Addendum)
Patient is alert and oriented.IVF is going as order. Inj. heparin is going on at the rate of 9 ml/hrs. She has been getting a prn and schedule pain medicine for her rt and left groin pain. Plan of care ongoing.    Problem: Education: Goal: Ability to describe self-care measures that may prevent or decrease complications (Diabetes Survival Skills Education) will improve Outcome: Progressing Goal: Individualized Educational Video(s) Outcome: Progressing   Problem: Coping: Goal: Ability to adjust to condition or change in health will improve Outcome: Progressing   Problem: Fluid Volume: Goal: Ability to maintain a balanced intake and output will improve Outcome: Progressing   Problem: Health Behavior/Discharge Planning: Goal: Ability to identify and utilize available resources and services will improve Outcome: Progressing Goal: Ability to manage health-related needs will improve Outcome: Progressing   Problem: Metabolic: Goal: Ability to maintain appropriate glucose levels will improve Outcome: Progressing   Problem: Nutritional: Goal: Maintenance of adequate nutrition will improve Outcome: Progressing Goal: Progress toward achieving an optimal weight will improve Outcome: Progressing   Problem: Skin Integrity: Goal: Risk for impaired skin integrity will decrease Outcome: Progressing   Problem: Tissue Perfusion: Goal: Adequacy of tissue perfusion will improve Outcome: Progressing   Problem: Education: Goal: Knowledge of General Education information will improve Description: Including pain rating scale, medication(s)/side effects and non-pharmacologic comfort measures Outcome: Progressing   Problem: Health Behavior/Discharge Planning: Goal: Ability to manage health-related needs will improve Outcome: Progressing   Problem: Clinical Measurements: Goal: Ability to maintain clinical measurements within normal limits will improve Outcome: Progressing Goal: Will remain  free from infection Outcome: Progressing Goal: Diagnostic test results will improve Outcome: Progressing Goal: Respiratory complications will improve Outcome: Progressing Goal: Cardiovascular complication will be avoided Outcome: Progressing   Problem: Activity: Goal: Risk for activity intolerance will decrease Outcome: Progressing   Problem: Nutrition: Goal: Adequate nutrition will be maintained Outcome: Progressing   Problem: Coping: Goal: Level of anxiety will decrease Outcome: Progressing   Problem: Elimination: Goal: Will not experience complications related to bowel motility Outcome: Progressing Goal: Will not experience complications related to urinary retention Outcome: Progressing   Problem: Pain Managment: Goal: General experience of comfort will improve and/or be controlled Outcome: Progressing   Problem: Safety: Goal: Ability to remain free from injury will improve Outcome: Progressing   Problem: Skin Integrity: Goal: Risk for impaired skin integrity will decrease Outcome: Progressing

## 2023-04-24 NOTE — Progress Notes (Addendum)
PROGRESS NOTE    Summer Murphy  WUJ:[id number]  DOB: 11-20-1955  DOA: 04/16/2023 PCP: [name], NP Outpatient Specialists:   Hospital course:  68 year old female with DM2, recent cervical fusion last month and chronic hip arthritis was admitted 04/16/2023 with sepsis after fall.  Workup revealed MSSA bacteremia and abscess/discitis at cervical fusion site.  Patient underwent drainage And Was Placed on cefazolin and rifampin.  Patient subsequently had lower back and bilateral leg pain however MRI showed only multilevel DJD although it was motion degraded.  Patient is presently awaiting SNF placement, PICC line is in place for 6 weeks of IV Kefzol.  Copied and pasted from previous note: 1/25: Afebrile this morning, blood cultures growing Staph aureus-pending susceptibility.  Hemoglobin decreased to 7.3 without any obvious bleeding. Type and screen was done and it shows positive antibody-ordered 1 unit of PRBC and also consulted neurosurgery as there is no other obvious source of infection.  Started on cefazolin as there is no resistance detected. Patient now meets the criteria for sepsis with fever and tachycardia, Staph aureus bacteremia.  Echocardiogram also ordered. Neurosurgery is going to take her to the OR for washout.   1/26: Preliminary postprocedural culture with no organism, anemia panel consistent with anemia of chronic disease and iron deficiency.  Started on supplement. Repeat blood cultures ordered.  Urine cultures with no growth   1/27: Repeat blood cultures with no growth in 24-hour, neurosurgery obtained lumbar MRI as she was complaining of lower back and bilateral leg pain, it was motion degraded study but did show multilevel degenerative changes and neural foraminal narrowing. Cervical wound culture with some Staph aureus.  TTE with EF of 45 to 50%, some concern of regional wall motion abnormalities likely due to LBBB, discussed with cardiology and they are  recommending outpatient follow-up.  Appointment was set with Dr. Mariah Milling in February. Pending final recommendations from ID.   1/28: Hemodynamically stable, continue to have lower back and leg pain.  Cervical drain remain in place.  ID is recommending at least 6 weeks of IV cefazolin followed by another 4 to 6 weeks of p.o. antibiotics.  PICC line was placed today.  Repeat blood cultures negative for 48 hours. 1/29: drain removed.  1/30-1/31: patient doing well. PT updated recs to SNF at dc. TOC informed. She is cleared to dc when bed available. Continue pain control, IV abx course as recommended by ID and mobilize patient as much as possible. Staple removal by neurosurgery 14 days post-op. Healing well.   Subjective:  This morning patient is in severe pain.  Patient states she has been in severe acute pain in her right groin for the past 45 minutes.  Notes that she has not had pain like this before.  Notes that she is due for her pain medications but has not received them.   Objective: Vitals:   04/24/23 0730 04/24/23 0926 04/24/23 0951 04/24/23 1020  BP: (!) 160/87 108/70 (!) 148/82 (!) 123/101  Pulse: 99 (!) 115 100 100  Resp: 16 18    Temp: 98.1 F (36.7 C) 97.8 F (36.6 C) 98 F (36.7 C)   TempSrc:  Oral Oral   SpO2: 97% 98% 98% 96%  Weight:      Height:        Intake/Output Summary (Last 24 hours) at 04/24/2023 1245 Last data filed at 04/24/2023 0958 Gross per 24 hour  Intake 240 ml  Output --  Net 240 ml   [employer]  04/16/23 1007 04/16/23 2139  Weight: 59.2 kg 56.8 kg     Exam:  General: Patient in acute distress writhing in pain, unable to be still. Eyes: sclera anicteric, conjuctiva mild injection bilaterally CVS: S1-S2, regular  Respiratory:  decreased air entry bilaterally secondary to decreased inspiratory effort, rales at bases  GI: NABS, soft, NT  LE: Patient with acute tenderness in her right groin with light palpation.  However I feel no pulsatile  mass, no fluctuance.  Patient will not let me manipulate her leg complaining of pain.  There is no evidence of cellulitis, no warmth or induration. Neuro: A/O x 3,  grossly nonfocal.    Data Reviewed:  Basic Metabolic Panel: Recent Labs  Lab 04/18/23 0653 04/20/23 0501 04/21/23 0506 04/23/23 0545 04/24/23 0454  NA 131* 134* 138 139 135  K 4.8 4.9 3.2* 3.6 3.5  CL 102 98 98 97* 94*  CO2 22 25 30 31 29   GLUCOSE 132* 102* 92 95 96  BUN 24* 11 12 14 12   CREATININE 0.56 0.38* 0.41* 0.44 0.44  CALCIUM 8.0* 7.9* 8.0* 8.0* 8.2*  MG  --   --  1.6*  --   --   PHOS 2.9  --   --   --   --     CBC: Recent Labs  Lab 04/18/23 0653 04/20/23 0501 04/21/23 0506 04/23/23 0545 04/24/23 0454  WBC 11.6* 14.7* 15.0* 14.9* 14.4*  HGB 8.4* 9.0* 8.5* 8.4* 8.6*  HCT 26.0* 27.6* 25.6* 26.2* 27.0*  MCV 72.2* 70.6* 70.1* 73.0* 71.4*  PLT 323 432* 513* 489* 515*     Scheduled Meds:  acetaminophen  650 mg Oral Q4H   busPIRone  10 mg Oral BID   celecoxib  100 mg Oral BID   Chlorhexidine Gluconate Cloth  6 each Topical Daily   diclofenac Sodium  2 g Topical QID   DULoxetine  60 mg Oral Daily   enoxaparin (LOVENOX) injection  40 mg Subcutaneous Q24H   Fe Fum-Vit C-Vit B12-FA  1 capsule Oral BID   fluticasone furoate-vilanterol  1 puff Inhalation Daily   gabapentin  900 mg Oral BID    HYDROmorphone (DILAUDID) injection  0.5 mg Intravenous NOW   methocarbamol  750 mg Oral TID   montelukast  10 mg Oral QHS   pantoprazole  20 mg Oral Daily   rifampin  300 mg Oral Q12H   rOPINIRole  1 mg Oral QHS   sodium chloride flush  10-40 mL Intracatheter Q12H   sodium chloride flush  3 mL Intravenous Q12H   traZODone  100 mg Oral QHS   Continuous Infusions:  sodium chloride 100 mL/hr at 04/24/23 1228    ceFAZolin (ANCEF) IV 2 g (04/24/23 1230)     Assessment & Plan:   New onset severe acute right groin pain in setting of MSSA bacteremia Concern for septic arthritis versus pathologic fracture  versus soft tissue infection. CT right hip shows "abnormal expansion and likely inflammation/edema of the distal right psoas muscle... Concern is raised for bilateral psoas muscle inflammation/infection and discitis-osteomyelitis of the L3-4 level." Repeat contrast-enhanced CT of abdomen pelvis with and without contrast is ordered as per recommendations--patient is hydrated given diet low x 2 and history of DM, will need to follow creatinine.  Echo with EF 45 to 50% last week. Discussed with ID, no need to change antibiotics at present Patient may need IR drainage of what looks like bilateral psoas abscesses Continue cefazolin and rifampin for discitis-osteomyelitis of L3-4  Will order repeat blood cultures today Of note leukocytosis has increased over the past couple of days  NB: CT abdomen pelvis with and without contrast done today shows:  1) bilateral psoas abscesses:  3 x 6 cm on the right and a 3 x 8 cmon the left.-- IR Consult placed for drainage, discussed with radiology who will review images today.  2) soft tissue thickening at L3-4 concerning for paravertebral soft tissue infection, but could get MRI with and without contrast to assess involvement of the disc space.--will order MRI after psoas abscesses have been addressed.  -nonocclusive thrombus in IVC at the level of the bifurcation--discussed with vascular, will start heparin drip and continue antibiotics in case this is an infected thrombus, no need for acute vascular surgery involvement unless bacteremia does not clear with prolonged antibiotics. -patchy groundglass opacities in the RML and both lower lobes with bronchiectasis in the lingula, likely sequela of multifocal infection/inflammation--at present patient is on room air, no signs of acute lung infection   Pain management of new severe hip pain Patient with severe pain requiring IV Dilaudid Will place patient on standing oxycodone and as needed IV Dilaudid as needed Will  need to watch for delirium, discussed with patient and sister-in-law who was at bedside  DM 2 Under reasonable control on present regimen  Hyponatremia Hypomagnesemia Hypokalemia Resolved with treatment, will follow  Falls Imaging workup is negative for acute fracture PT and OT as warranted  Asthma No evidence of acute flare, continue home inhalers  Anxiety and depression Continue Cymbalta and BuSpar Continue as needed clonazepam  GERD Continue PPI   DVT prophylaxis: Lovenox Code Status: Full Family Communication: Sister-in-law was at bedside, she is her closest relative, she has cousins who are going to be visiting her as well.     Studies: CT HIP RIGHT W CONTRAST Result Date: 04/24/2023 CLINICAL DATA:  Acute right hip pain with limping.  Swelling. EXAM: CT OF THE LOWER RIGHT EXTREMITY WITH CONTRAST TECHNIQUE: Multidetector CT imaging of the lower right extremity was performed according to the standard protocol following intravenous contrast administration. RADIATION DOSE REDUCTION: This exam was performed according to the departmental dose-optimization program which includes automated exposure control, adjustment of the mA and/or kV according to patient size and/or use of iterative reconstruction technique. CONTRAST:  80mL OMNIPAQUE IOHEXOL 300 MG/ML  SOLN COMPARISON:  CT pelvis 04/16/2023 FINDINGS: Bones/Joint/Cartilage Chondrocalcinosis in the pubic symphysis. Mild to moderate degenerative chondral thinning in the right hip. Ligaments Suboptimally assessed by CT. Muscles and Tendons Compared to 04/16/2023 there appears to be expansion of the right distal psoas muscle concerning for inflammation or infection. Reviewing the psoas musculature on recent lumbar MRI of 04/19/2023, the region is highly obscured and blurred by motion artifact, but given the findings on today's exam I do have concern both psoas muscles may have accentuated T2 signal which could be indicators of  inflammation, hematoma, or infection. Contrast enhanced CT abdomen/pelvis with and without contrast, if feasible, might be utilized to reassess the bilateral psoas musculature with less detrimental effect of motion artifact. Soft tissues Trace subcutaneous edema lateral to the right hip. Mild sigmoid colon diverticulosis. IMPRESSION: 1. Abnormal expansion and likely inflammation/edema of the distal right psoas muscle. Severely limited 04/19/2023 lumbar MRI due to motion artifact clouds assessment, but in the context of today's CT findings, concern is raised for bilateral psoas muscle inflammation/infection and discitis-osteomyelitis at the L3-4 level. 2. Mild to moderate degenerative chondral thinning in the right hip. 3. Chondrocalcinosis  in the pubic symphysis. 4. Mild sigmoid colon diverticulosis. Electronically Signed   By: Gaylyn Rong M.D.   On: 04/24/2023 11:33    Principal Problem:   Sepsis (HCC) Active Problems:   T2DM (type 2 diabetes mellitus) (HCC)   Fever   Falls   Chronic pain syndrome   Asthma   Restless leg   Migraine   Anxiety and depression   Acid reflux   Electrolyte abnormality   Microcytic anemia   MSSA bacteremia   Surgical site infection     Wilbon Obenchain Orma Flaming, [facility]  If 7PM-7AM, please contact night-coverage [email or web address]   LOS: 7 days

## 2023-04-24 NOTE — Progress Notes (Signed)
Patient received form procedure via bed in stable condition.

## 2023-04-25 ENCOUNTER — Inpatient Hospital Stay: Payer: 59

## 2023-04-25 DIAGNOSIS — K6812 Psoas muscle abscess: Secondary | ICD-10-CM | POA: Diagnosis not present

## 2023-04-25 LAB — CBC WITH DIFFERENTIAL/PLATELET
Abs Immature Granulocytes: 0.2 10*3/uL — ABNORMAL HIGH (ref 0.00–0.07)
Basophils Absolute: 0.1 10*3/uL (ref 0.0–0.1)
Basophils Relative: 0 %
Eosinophils Absolute: 0.4 10*3/uL (ref 0.0–0.5)
Eosinophils Relative: 3 %
HCT: 25.3 % — ABNORMAL LOW (ref 36.0–46.0)
Hemoglobin: 7.9 g/dL — ABNORMAL LOW (ref 12.0–15.0)
Immature Granulocytes: 1 %
Lymphocytes Relative: 11 %
Lymphs Abs: 1.5 10*3/uL (ref 0.7–4.0)
MCH: 23.2 pg — ABNORMAL LOW (ref 26.0–34.0)
MCHC: 31.2 g/dL (ref 30.0–36.0)
MCV: 74.4 fL — ABNORMAL LOW (ref 80.0–100.0)
Monocytes Absolute: 0.5 10*3/uL (ref 0.1–1.0)
Monocytes Relative: 4 %
Neutro Abs: 11.3 10*3/uL — ABNORMAL HIGH (ref 1.7–7.7)
Neutrophils Relative %: 81 %
Platelets: 448 10*3/uL — ABNORMAL HIGH (ref 150–400)
RBC: 3.4 MIL/uL — ABNORMAL LOW (ref 3.87–5.11)
RDW: 24.2 % — ABNORMAL HIGH (ref 11.5–15.5)
Smear Review: NORMAL
WBC: 13.9 10*3/uL — ABNORMAL HIGH (ref 4.0–10.5)
nRBC: 0 % (ref 0.0–0.2)

## 2023-04-25 LAB — HEPARIN LEVEL (UNFRACTIONATED): Heparin Unfractionated: 0.1 [IU]/mL — ABNORMAL LOW (ref 0.30–0.70)

## 2023-04-25 LAB — BASIC METABOLIC PANEL
Anion gap: 11 (ref 5–15)
BUN: 7 mg/dL — ABNORMAL LOW (ref 8–23)
CO2: 27 mmol/L (ref 22–32)
Calcium: 7.9 mg/dL — ABNORMAL LOW (ref 8.9–10.3)
Chloride: 99 mmol/L (ref 98–111)
Creatinine, Ser: <0.3 mg/dL — ABNORMAL LOW (ref 0.44–1.00)
Glucose, Bld: 90 mg/dL (ref 70–99)
Potassium: 3.2 mmol/L — ABNORMAL LOW (ref 3.5–5.1)
Sodium: 137 mmol/L (ref 135–145)

## 2023-04-25 LAB — PROTIME-INR
INR: 1.4 — ABNORMAL HIGH (ref 0.8–1.2)
Prothrombin Time: 17.7 s — ABNORMAL HIGH (ref 11.4–15.2)

## 2023-04-25 MED ORDER — POTASSIUM CHLORIDE CRYS ER 20 MEQ PO TBCR
40.0000 meq | EXTENDED_RELEASE_TABLET | Freq: Once | ORAL | Status: AC
  Administered 2023-04-25: 40 meq via ORAL
  Filled 2023-04-25: qty 2

## 2023-04-25 MED ORDER — MIDAZOLAM HCL 2 MG/2ML IJ SOLN
INTRAMUSCULAR | Status: AC
  Filled 2023-04-25: qty 4

## 2023-04-25 MED ORDER — FENTANYL CITRATE (PF) 100 MCG/2ML IJ SOLN
INTRAMUSCULAR | Status: AC | PRN
  Administered 2023-04-25 (×2): 50 ug via INTRAVENOUS

## 2023-04-25 MED ORDER — FENTANYL CITRATE (PF) 100 MCG/2ML IJ SOLN
INTRAMUSCULAR | Status: AC
  Filled 2023-04-25: qty 2

## 2023-04-25 MED ORDER — LIDOCAINE HCL 1 % IJ SOLN
INTRAMUSCULAR | Status: AC | PRN
  Administered 2023-04-25 (×2): 10 mL

## 2023-04-25 MED ORDER — HEPARIN (PORCINE) 25000 UT/250ML-% IV SOLN: 900.0000 [IU]/h | INTRAVENOUS | Status: DC

## 2023-04-25 MED ORDER — HEPARIN (PORCINE) 25000 UT/250ML-% IV SOLN
1400.0000 [IU]/h | INTRAVENOUS | Status: DC
  Administered 2023-04-25: 900 [IU]/h via INTRAVENOUS
  Administered 2023-04-26: 1300 [IU]/h via INTRAVENOUS
  Filled 2023-04-25 (×2): qty 250

## 2023-04-25 MED ORDER — MIDAZOLAM HCL 2 MG/2ML IJ SOLN
INTRAMUSCULAR | Status: AC | PRN
  Administered 2023-04-25: 1 mg via INTRAVENOUS

## 2023-04-25 MED ORDER — GADOBUTROL 1 MMOL/ML IV SOLN
5.0000 mL | Freq: Once | INTRAVENOUS | Status: AC | PRN
  Administered 2023-04-25: 5 mL via INTRAVENOUS

## 2023-04-25 NOTE — Consult Note (Signed)
Chief Complaint: Patient was seen in consultation today for bilateral psoas abscesses  Referring Physician(s): Summer Partridge, MD  Patient Status: [facility] - In-pt  History of Present Illness: 68 year old female with DM2, recent cervical fusion last month and chronic hip arthritis was admitted 04/16/2023 with sepsis after fall.  Workup revealed MSSA bacteremia and fluid collections at cervical fusion site.  Patient underwent surgical washout.  Patient also has back and groin pain.  MRI lumbar spine was very limited due to motion but a CT AP from 04/24/23 demonstrates bilateral psoas collections that are concerning for abscesses. Inflammatory changes around L4 and small DVT near IVC bifurcation.    Past Medical History:  Diagnosis Date   Acid reflux    Anemia    Anxiety    a.) buspirone BID + BZO PRN (clonazepam)   Arthritis    Asthma    Cervical disc disorder with radiculopathy of cervical region    Chronic pain syndrome    a.) on COT as prescribed by pain mangement   Chronic, continuous use of opioids    a.) followed by pain mangement   COPD (chronic obstructive pulmonary disease) (HCC)    DOE (dyspnea on exertion)    Hematuria    Hiatal hernia    History of kidney stones    Hypertension    Hypoglycemia    Insomnia    a.) uses Trazodone PRN   LBBB (left bundle branch block)    Migraine    Murmur    Osteopenia    Palpitations    Pre-diabetes    Restless leg    a.) on ropinirole   Stenosis of cervical spine with myelopathy (HCC)    T2DM (type 2 diabetes mellitus) (HCC)     Past Surgical History:  Procedure Laterality Date   ABDOMINAL HYSTERECTOMY     AMPUTATION TOE Left 07/31/2016   Procedure: AMPUTATION TOE/MPJ 2nd toe;  Surgeon: Linus Galas, DPM;  Location: [facility] ORS;  Service: Podiatry;  Laterality: Left;   APPENDECTOMY  1990   BACK SURGERY     low back   BREAST SURGERY     bilateral breast reduction   CARDIAC ELECTROPHYSIOLOGY STUDY AND ABLATION      CERVICAL WOUND DEBRIDEMENT N/A 04/17/2023   Procedure: CERVICAL WOUND DEBRIDEMENT;  Surgeon: Lucy Chris, MD;  Location: [facility] ORS;  Service: Neurosurgery;  Laterality: N/A;   CHOLECYSTECTOMY  1990   COLONOSCOPY WITH PROPOFOL N/A 01/04/2017   Procedure: COLONOSCOPY WITH PROPOFOL;  Surgeon: Scot Jun, MD;  Location: [facility] ENDOSCOPY;  Service: Endoscopy;  Laterality: N/A;   CORNEAL TRANSPLANT     ESOPHAGOGASTRODUODENOSCOPY N/A 04/09/2021   Procedure: ESOPHAGOGASTRODUODENOSCOPY (EGD);  Surgeon: Toney Reil, MD;  Location: [facility] ENDOSCOPY;  Service: Gastroenterology;  Laterality: N/A;   ESOPHAGOGASTRODUODENOSCOPY (EGD) WITH PROPOFOL N/A 01/04/2017   Procedure: ESOPHAGOGASTRODUODENOSCOPY (EGD) WITH PROPOFOL;  Surgeon: Scot Jun, MD;  Location: [facility] ENDOSCOPY;  Service: Endoscopy;  Laterality: N/A;   EXCISION BONE CYST Left 07/31/2016   Procedure: EXCISION BONE CYST/exostectomy 28124/left 2nd;  Surgeon: Linus Galas, DPM;  Location: [facility] ORS;  Service: Podiatry;  Laterality: Left;   EXTRACORPOREAL SHOCK WAVE LITHOTRIPSY Left 09/12/2015   Procedure: EXTRACORPOREAL SHOCK WAVE LITHOTRIPSY (ESWL);  Surgeon: Vanna Scotland, MD;  Location: [facility] ORS;  Service: Urology;  Laterality: Left;   FRACTURE SURGERY     left foot   GUM SURGERY Left    gum infection   HARDWARE REMOVAL Left 11/14/2020   Procedure: HARDWARE REMOVAL;  Surgeon: Kennedy Bucker,  MD;  Location: [facility] ORS;  Service: Orthopedics;  Laterality: Left;   HH repair     Fundoplication   JOINT REPLACEMENT Bilateral 2013,2014   total knees   LAPAROSCOPIC HYSTERECTOMY     LITHOTRIPSY     periprosthetic supracondylar fracture of left femur  02/16/2020   [facility]   POSTERIOR CERVICAL FUSION/FORAMINOTOMY N/A 03/02/2023   Procedure: C3-6 POSTERIOR CERVICAL LAMINECTOMY AND FUSION;  Surgeon: Lovenia Kim, MD;  Location: [facility] ORS;  Service: Neurosurgery;  Laterality: N/A;   TONSILLECTOMY     TOTAL KNEE REVISION Right  11/14/2019   Procedure: Revision patella and tibial polyethylene;  Surgeon: Kennedy Bucker, MD;  Location: [facility] ORS;  Service: Orthopedics;  Laterality: Right;   URETEROSCOPY      Allergies: Librium [chlordiazepoxide], Reglan [metoclopramide], Vanilla, Aspirin, Nsaids, Azithromycin, Buprenorphine hcl, Flexeril [cyclobenzaprine], Morphine, Sulfa antibiotics, Tolmetin, Amlodipine besylate, and Iron  Medications: Prior to Admission medications   Medication Sig Start Date End Date Taking? Authorizing Provider  acetaminophen (TYLENOL) 500 MG tablet Take 2 tablets (1,000 mg total) by mouth every 6 (six) hours as needed. 03/22/23 03/21/24 Yes Joan Flores, PA-C  alendronate (FOSAMAX) 70 MG tablet Take 1 tablet (70 mg total) by mouth once a week. Take with a full glass of water on an empty stomach. 10/06/22  Yes Abernathy, Alyssa, NP  busPIRone (BUSPAR) 10 MG tablet Take 1 tablet (10 mg total) by mouth 2 (two) times daily. 03/31/23  Yes Abernathy, Arlyss Repress, NP  celecoxib (CELEBREX) 100 MG capsule Take 1 capsule (100 mg total) by mouth 2 (two) times daily. 03/29/23  Yes Abernathy, Arlyss Repress, NP  clonazePAM (KLONOPIN) 1 MG tablet Take 0.5-1 tablets (0.5-1 mg total) by mouth 2 (two) times daily as needed for anxiety. 03/29/23  Yes Abernathy, Alyssa, NP  COMBIVENT RESPIMAT 20-100 MCG/ACT AERS respimat INHALE 1 PUFF BY MOUTH EVERY 6 HOURS 03/04/23  Yes Abernathy, Alyssa, NP  diclofenac Sodium (VOLTAREN) 1 % GEL Apply 4 g topically 4 (four) times daily. Patient taking differently: Apply 4 g topically 4 (four) times daily as needed (pain). 08/13/20  Yes Lyndon Code, MD  diltiazem (CARDIZEM CD) 120 MG 24 hr capsule Take 1 capsule (120 mg total) by mouth daily. 04/01/23  Yes Abernathy, Arlyss Repress, NP  DULoxetine (CYMBALTA) 60 MG capsule TAKE 1 CAPSULE(60 MG) BY MOUTH AT BEDTIME 04/02/23  Yes Abernathy, Alyssa, NP  fluticasone-salmeterol (WIXELA INHUB) 250-50 MCG/ACT AEPB Inhale 1 puff into the lungs in the morning and at  bedtime. 12/21/22  Yes Dgayli, Lianne Bushy, MD  gabapentin (NEURONTIN) 300 MG capsule TAKE 1 CAPSULE(300 MG) BY MOUTH TWICE DAILY 03/31/23  Yes Abernathy, Alyssa, NP  gabapentin (NEURONTIN) 600 MG tablet TAKE 1 TABLET(600 MG) BY MOUTH TWICE DAILY 01/27/23  Yes Abernathy, Alyssa, NP  ipratropium-albuterol (DUONEB) 0.5-2.5 (3) MG/3ML SOLN Take 3 mLs by nebulization 4 (four) times daily. 08/07/22  Yes Nyoka Cowden, MD  montelukast (SINGULAIR) 10 MG tablet TAKE 1 TABLET(10 MG) BY MOUTH AT BEDTIME 12/30/22  Yes Abernathy, Alyssa, NP  oxyCODONE (OXY IR/ROXICODONE) 5 MG immediate release tablet Take 5 mg by mouth every 6 (six) hours as needed for severe pain (pain score 7-10).   Yes [provider]  pantoprazole (PROTONIX) 40 MG tablet Take 40 mg by mouth 2 (two) times daily.   Yes [provider]  rOPINIRole (REQUIP) 0.5 MG tablet TAKE 2 TABLETS BY MOUTH TWICE DAILY FOR RESTLESS LEGS 04/02/23  Yes Abernathy, Alyssa, NP  senna (SENOKOT) 8.6 MG TABS tablet Take 1 tablet (  8.6 mg total) by mouth 2 (two) times daily as needed for mild constipation. 03/03/23  Yes Susanne Borders, PA  tirzepatide [facility]) 2.5 MG/0.5ML Pen Inject 2.5 mg into the skin once a week. 11/03/22  Yes Abernathy, Alyssa, NP  tiZANidine (ZANAFLEX) 4 MG tablet TAKE 1 TABLET(4 MG) BY MOUTH EVERY 8 HOURS FOR UP TO 14 DAYS AS NEEDED FOR MUSCLE SPASMS 04/13/23  Yes Lovenia Kim, MD  traZODone (DESYREL) 150 MG tablet TAKE 1 TABLET(150 MG) BY MOUTH AT BEDTIME 04/15/23  Yes Abernathy, Arlyss Repress, NP  Accu-Chek Softclix Lancets lancets Use as instructed once a daily DX R730.01 06/22/22   Lyndon Code, MD  Blood Glucose Monitoring Suppl (ACCU-CHEK GUIDE ME) w/Device KIT Use as directed once a day DX R730.01 06/09/22   Lyndon Code, MD  glucose blood (ACCU-CHEK GUIDE) test strip Use as instructed once daily Dx R73.01 06/09/22   Lyndon Code, MD  lidocaine (XYLOCAINE) 2 % solution Use as directed 10 mLs in the mouth or throat every 6 (six)  hours as needed for mouth pain. Patient not taking: Reported on 04/22/2023 10/06/22   [name], NP  ondansetron (ZOFRAN) 4 MG tablet TAKE 1 TABLET BY MOUTH TWICE DAILY AS NEEDED Patient not taking: Reported on 04/22/2023 11/06/21   Lyndon Code, MD  SUMAtriptan (IMITREX) 100 MG tablet Take 1 tablet (100 mg total) by mouth every 2 (two) hours as needed (for migraine headaches.). May repeat in 1 hours if headache persists or recurs. Patient not taking: Reported on 04/22/2023 09/18/21   [name], NP     Family History  Problem Relation Age of Onset   Hypertension Mother    Stroke Mother    Prostate cancer Father    Kidney Stones Father    Diabetes Brother    Hypertension Brother    Breast cancer Maternal Aunt    Kidney disease Neg Hx     Social History   Socioeconomic History   Marital status: Widowed    Spouse name: Not on file   Number of children: Not on file   Years of education: Not on file   Highest education level: Not on file  Occupational History   Not on file  Tobacco Use   Smoking status: Never    Passive exposure: Past   Smokeless tobacco: Never  Vaping Use   Vaping status: Never Used  Substance and Sexual Activity   Alcohol use: No   Drug use: No   Sexual activity: Not Currently  Other Topics Concern   Not on file  Social History Narrative   Lives at home alone. Relatives are the support person.    Social Drivers of Corporate investment banker Strain: Not on file  Food Insecurity: No Food Insecurity (04/18/2023)   Hunger Vital Sign    Worried About Running Out of Food in the Last Year: Never true    Ran Out of Food in the Last Year: Never true  Transportation Needs: No Transportation Needs (04/18/2023)   PRAPARE - Administrator, Civil Service (Medical): No    Lack of Transportation (Non-Medical): No  Physical Activity: Not on file  Stress: Not on file  Social Connections: Moderately Isolated (04/16/2023)   Social Connection  and Isolation Panel [NHANES]    Frequency of Communication with Friends and Family: More than three times a week    Frequency of Social Gatherings with Friends and Family: Once a week    Attends Religious  Services: More than 4 times per year    Active Member of Clubs or Organizations: No    Attends Banker Meetings: Never    Marital Status: Widowed    Review of Systems  Respiratory: Negative.    Cardiovascular: Negative.   Gastrointestinal: Negative.   Musculoskeletal:  Positive for back pain.       Left shoulder pain  Bilateral groin pain    Vital Signs: BP (!) 146/67 (BP Location: Left Arm)   Pulse 90   Temp 98.4 F (36.9 C)   Resp 18   Ht 5' 2.99" (1.6 m)   Wt 56.8 kg   SpO2 96%   BMI 22.19 kg/m     Physical Exam Constitutional:      Appearance: She is not ill-appearing.  Cardiovascular:     Rate and Rhythm: Normal rate.  Pulmonary:     Effort: Pulmonary effort is normal.  Abdominal:     General: Abdomen is flat.  Neurological:     Mental Status: She is alert.     Imaging: CT ABDOMEN PELVIS W WO CONTRAST Result Date: 04/24/2023 CLINICAL DATA:  Follow-up psoas muscle abscess. EXAM: CT ABDOMEN AND PELVIS WITHOUT AND WITH CONTRAST TECHNIQUE: Multidetector CT imaging of the abdomen and pelvis was performed following the standard protocol before and following the bolus administration of intravenous contrast. RADIATION DOSE REDUCTION: This exam was performed according to the departmental dose-optimization program which includes automated exposure control, adjustment of the mA and/or kV according to patient size and/or use of iterative reconstruction technique. CONTRAST:  80mL OMNIPAQUE IOHEXOL 300 MG/ML  SOLN COMPARISON:  04/16/2023 FINDINGS: Lower chest: Patchy areas of ground-glass attenuation are again seen within the right middle lobe in both lower lobes. Bronchiectasis identified within the lingula. No pleural fluid or consolidative change. Hepatobiliary:  No suspicious liver lesion. Status post cholecystectomy. Mild intrahepatic bile duct dilatation. Common bile duct measures 8 mm in diameter. This is unchanged compared with the previous exam. In the absence of clinical signs or symptoms of biliary obstruction these findings likely represent post cholecystectomy physiology. Clinical correlation advised. Pancreas: Unremarkable. No pancreatic ductal dilatation or surrounding inflammatory changes. Spleen: Normal in size without focal abnormality. Adrenals/Urinary Tract: Normal adrenal glands. No kidney mass or obstructive uropathy. The bladder appears within normal limits. Stomach/Bowel: Postoperative changes about the GE junction with signs of recurrent moderate hiatal hernia. No pathologic dilatation of the large or small bowel loops to suggest obstruction. No bowel wall thickening, inflammation, or distension. Mild stool burden identified within the transverse colon. Vascular/Lymphatic: Aortic atherosclerosis. No aneurysm. There is a small mural based filling defect involving the IVC at the level of the bifurcation which appears mural based and may be adherent to the wall of the IVC compatible with thrombus, image 43/7. This does not appear to be occlusive. No signs of abdominopelvic adenopathy. Reproductive: Status post hysterectomy. No adnexal masses. Other: Peripherally enhancing fluid collection within the left psoas muscle measures 3.0 by 2.2 by 8.3 cm, image 52/10. This is suboptimally visualized on the unenhanced CT from 04/16/2023. Within the right psoas muscle there is a complex, rim enhancing fluid collection containing small foci of gas measuring 3.2 x 3.3 by 6.1 cm. Along the medial margin of the psoas muscle there is a small punctate phos is of gas, image 42/7. Additionally, there is increased soft tissue thickening within the paravertebral space about the L3-4 level containing a small punctate focus of gas, image 35/7. Musculoskeletal: No acute osseous  findings.  Lumbar degenerative disc disease identified. No definite focal bone erosions involving the endplates at the L3-4 level where there is increased paravertebral soft tissue containing a small focus of gas. IMPRESSION: 1. There is a complex, rim enhancing fluid collection containing small foci of gas within the right psoas muscle measuring 3.2 x 3.3 x 6.1 cm. This is compatible with psoas abscess. 2. There is a peripherally enhancing fluid collection within the left psoas muscle measuring 3.0 x 2.2 x 8.3 cm. This is suboptimally visualized on the unenhanced CT from 04/16/2023. This is also compatible with psoas abscess. 3. Increased soft tissue thickening within the paravertebral space about the L3-4 level containing a small punctate focus of gas. This is concerning for paravertebral soft tissue infection. No definite focal bone erosions involving the endplates at the L3-4 level. If clinically indicated repeat MRI with and without contrast material of the lumbar spine would be advised to assess for involvement of the disc space. 4. Nonocclusive filling defect within the IVC at the level of the bifurcation compatible with thrombus. 5. Postoperative changes about the GE junction with signs of recurrent moderate hiatal hernia. 6. Patchy areas of ground-glass attenuation are again seen within the right middle lobe in both lower lobes. Bronchiectasis identified within the lingula. Imaging findings are likely the sequelae of multifocal inflammation/infection. 7. Aortic Atherosclerosis (ICD10-I70.0). Electronically Signed   By: Signa Kell M.D.   On: 04/24/2023 14:27   CT HIP RIGHT W CONTRAST Result Date: 04/24/2023 CLINICAL DATA:  Acute right hip pain with limping.  Swelling. EXAM: CT OF THE LOWER RIGHT EXTREMITY WITH CONTRAST TECHNIQUE: Multidetector CT imaging of the lower right extremity was performed according to the standard protocol following intravenous contrast administration. RADIATION DOSE REDUCTION:  This exam was performed according to the departmental dose-optimization program which includes automated exposure control, adjustment of the mA and/or kV according to patient size and/or use of iterative reconstruction technique. CONTRAST:  80mL OMNIPAQUE IOHEXOL 300 MG/ML  SOLN COMPARISON:  CT pelvis 04/16/2023 FINDINGS: Bones/Joint/Cartilage Chondrocalcinosis in the pubic symphysis. Mild to moderate degenerative chondral thinning in the right hip. Ligaments Suboptimally assessed by CT. Muscles and Tendons Compared to 04/16/2023 there appears to be expansion of the right distal psoas muscle concerning for inflammation or infection. Reviewing the psoas musculature on recent lumbar MRI of 04/19/2023, the region is highly obscured and blurred by motion artifact, but given the findings on today's exam I do have concern both psoas muscles may have accentuated T2 signal which could be indicators of inflammation, hematoma, or infection. Contrast enhanced CT abdomen/pelvis with and without contrast, if feasible, might be utilized to reassess the bilateral psoas musculature with less detrimental effect of motion artifact. Soft tissues Trace subcutaneous edema lateral to the right hip. Mild sigmoid colon diverticulosis. IMPRESSION: 1. Abnormal expansion and likely inflammation/edema of the distal right psoas muscle. Severely limited 04/19/2023 lumbar MRI due to motion artifact clouds assessment, but in the context of today's CT findings, concern is raised for bilateral psoas muscle inflammation/infection and discitis-osteomyelitis at the L3-4 level. 2. Mild to moderate degenerative chondral thinning in the right hip. 3. Chondrocalcinosis in the pubic symphysis. 4. Mild sigmoid colon diverticulosis. Electronically Signed   By: Gaylyn Rong M.D.   On: 04/24/2023 11:33   [place] EKG SITE RITE Result Date: 04/20/2023 If Site Rite image not attached, placement could not be confirmed due to current cardiac rhythm.  MR LUMBAR  SPINE WO CONTRAST Result Date: 04/19/2023 CLINICAL DATA:  Lumbar radiculopathy, prior surgery,  new symptoms. EXAM: MRI LUMBAR SPINE WITHOUT CONTRAST TECHNIQUE: Multiplanar, multisequence MR imaging of the lumbar spine was performed. No intravenous contrast was administered. COMPARISON:  MRI of the lumbar spine November 05, 2005. FINDINGS: The study is severely degraded by motion. Segmentation: 5 lumbar type vertebrae assumed. Alignment:  Small retrolisthesis at L1-2 and L4-5. Vertebrae: STIR images suggest endplate marrow edema at L3-4. However, these images are severely degraded by motion. T12-L1 appear chronically fused. No fracture with significant height loss of retropulsion. Conus medullaris and cauda equina: Conus extends to the L1-2 level. Paraspinal and other soft tissues: Grossly unremarkable. Disc levels: T12-L1: Fused level. No high-grade spinal canal or neural foraminal stenosis. L1-2: Disc bulge and facet degenerative changes with mild bilateral neural foraminal narrowing. No high-grade spinal canal stenosis. L2-3: Disc bulge and facet degenerative changes results in likely moderate spinal canal stenosis and mild-to-moderate bilateral neural foraminal narrowing. L3-4: Likely mild to moderate spinal canal stenosis and mild bilateral neural foraminal narrowing. L4-5: Likely moderate spinal canal stenosis and moderate bilateral neural foraminal narrowing. L5-S1: There appears to be severe left and moderate right neural foraminal narrowing. No spinal canal stenosis. IMPRESSION: 1. Severely motion degraded study. Repeat study when clinically appropriate suggested. 2. Multilevel degenerative changes of the lumbar spine with likely moderate spinal canal stenosis at L2-3 and L4-5. 3. Multilevel neural foraminal narrowing, severe on the left at L5-S1. Electronically Signed   By: Baldemar Lenis M.D.   On: 04/19/2023 15:27   ECHOCARDIOGRAM COMPLETE Result Date: 04/19/2023    ECHOCARDIOGRAM REPORT    Patient Name:   Summer Murphy Date of Exam: 04/18/2023 Medical Rec #:  [id number]         Height:       63.0 in Accession #:    [id number]        Weight:       125.2 lb Date of Birth:  April 14, 1955         BSA:          1.585 m Patient Age:    67 years          BP:           139/62 mmHg Patient Gender: F                 HR:           95 bpm. Exam Location:  [facility] Procedure: 2D Echo, Cardiac Doppler and Color Doppler Indications:     Bacteremia R78.81  History:         Patient has prior history of Echocardiogram examinations, most                  recent 04/21/2021. Migraine and COPD, Signs/Symptoms:Dyspnea and                  Shortness of Breath; Risk Factors:Diabetes.  Sonographer:     Lucendia Herrlich RCS Referring Phys:  [id number] AMIN Diagnosing Phys: Chilton Si MD IMPRESSIONS  1. Left ventricular ejection fraction, by estimation, is 45 to 50%. The left ventricle has mildly decreased function. The left ventricle demonstrates regional wall motion abnormalities (see scoring diagram/findings for description). Left ventricular diastolic parameters are consistent with Grade I diastolic dysfunction (impaired relaxation).  2. Right ventricular systolic function is normal. The right ventricular size is normal. There is moderately elevated pulmonary artery systolic pressure.  3. Left atrial size was severely dilated.  4. The mitral valve is normal in structure. Mild to  moderate mitral valve regurgitation. No evidence of mitral stenosis.  5. Tricuspid valve regurgitation is mild to moderate.  6. The aortic valve is tricuspid. Aortic valve regurgitation is not visualized. No aortic stenosis is present.  7. The inferior vena cava is dilated in size with <50% respiratory variability, suggesting right atrial pressure of 15 mmHg. FINDINGS  Left Ventricle: Left ventricular ejection fraction, by estimation, is 45 to 50%. The left ventricle has mildly decreased function. The left ventricle demonstrates regional wall  motion abnormalities. The left ventricular internal cavity size was normal in size. There is no left ventricular hypertrophy. Left ventricular diastolic parameters are consistent with Grade I diastolic dysfunction (impaired relaxation). Indeterminate filling pressures.  LV Wall Scoring: The entire inferior wall, mid inferoseptal segment, and basal inferoseptal segment are hypokinetic. The entire anterior wall, entire lateral wall, entire anterior septum, and apex are normal. Right Ventricle: The right ventricular size is normal. No increase in right ventricular wall thickness. Right ventricular systolic function is normal. There is moderately elevated pulmonary artery systolic pressure. The tricuspid regurgitant velocity is 2.91 m/s, and with an assumed right atrial pressure of 15 mmHg, the estimated right ventricular systolic pressure is 48.9 mmHg. Left Atrium: Left atrial size was severely dilated. Right Atrium: Right atrial size was normal in size. Pericardium: There is no evidence of pericardial effusion. Mitral Valve: The mitral valve is normal in structure. Mild to moderate mitral valve regurgitation. No evidence of mitral valve stenosis. Tricuspid Valve: The tricuspid valve is normal in structure. Tricuspid valve regurgitation is mild to moderate. No evidence of tricuspid stenosis. Aortic Valve: The aortic valve is tricuspid. Aortic valve regurgitation is not visualized. No aortic stenosis is present. Aortic valve peak gradient measures 6.2 mmHg. Pulmonic Valve: The pulmonic valve was normal in structure. Pulmonic valve regurgitation is not visualized. No evidence of pulmonic stenosis. Aorta: The aortic root is normal in size and structure. Venous: The inferior vena cava is dilated in size with less than 50% respiratory variability, suggesting right atrial pressure of 15 mmHg. IAS/Shunts: No atrial level shunt detected by color flow Doppler.  LEFT VENTRICLE PLAX 2D LVIDd:         5.00 cm      Diastology  LVIDs:         4.30 cm      LV e' medial:    8.27 cm/s LV PW:         0.80 cm      LV E/e' medial:  10.1 LV IVS:        0.70 cm      LV e' lateral:   12.30 cm/s LVOT diam:     2.00 cm      LV E/e' lateral: 6.8 LV SV:         51 LV SV Index:   32 LVOT Area:     3.14 cm  LV Volumes (MOD) LV vol d, MOD A2C: 138.0 ml LV vol d, MOD A4C: 99.1 ml LV vol s, MOD A2C: 65.8 ml LV vol s, MOD A4C: 42.6 ml LV SV MOD A2C:     72.2 ml LV SV MOD A4C:     99.1 ml LV SV MOD BP:      65.0 ml RIGHT VENTRICLE             IVC RV S prime:     13.50 cm/s  IVC diam: 2.20 cm TAPSE (M-mode): 2.4 cm LEFT ATRIUM  Index        RIGHT ATRIUM           Index LA diam:        4.30 cm 2.71 cm/m   RA Area:     13.10 cm LA Vol (A2C):   58.2 ml 36.73 ml/m  RA Volume:   30.50 ml  19.25 ml/m LA Vol (A4C):   77.1 ml 48.65 ml/m LA Biplane Vol: 68.8 ml 43.41 ml/m  AORTIC VALVE AV Area (Vmax): 2.11 cm AV Vmax:        125.00 cm/s AV Peak Grad:   6.2 mmHg LVOT Vmax:      83.85 cm/s LVOT Vmean:     55.000 cm/s LVOT VTI:       0.161 m  AORTA Ao Root diam: 2.90 cm Ao Asc diam:  2.90 cm MITRAL VALVE                TRICUSPID VALVE MV Area (PHT): 5.54 cm     TR Peak grad:   33.9 mmHg MV Decel Time: 137 msec     TR Vmax:        291.00 cm/s MR Peak grad: 94.6 mmHg MR Vmax:      486.33 cm/s   SHUNTS MV E velocity: 83.30 cm/s   Systemic VTI:  0.16 m MV A velocity: 121.00 cm/s  Systemic Diam: 2.00 cm MV E/A ratio:  0.69 Chilton Si MD Electronically signed by Chilton Si MD Signature Date/Time: 04/19/2023/7:52:18 AM    Final    MR Cervical Spine W and Wo Contrast Result Date: 04/16/2023 CLINICAL DATA:  Myelopathy, acute, cervical spine. Fever. Posterior cervical fusion last month. EXAM: MRI CERVICAL SPINE WITHOUT AND WITH CONTRAST TECHNIQUE: Multiplanar and multiecho pulse sequences of the cervical spine, to include the craniocervical junction and cervicothoracic junction, were obtained without and with intravenous contrast. CONTRAST:  6mL  GADAVIST GADOBUTROL 1 MMOL/ML IV SOLN COMPARISON:  Cervical spine CT 04/16/2023 and MRI 11/18/2022 FINDINGS: Multiple sequences are severely motion degraded. Alignment: Mild reversal of the normal cervical lordosis. No significant listhesis. Vertebrae: No fracture, suspicious marrow lesion, or evidence of discitis. C3-C6 posterior decompression and fusion. Predominately moderate disc space narrowing from C3-4 through C6-7 with chronic degenerative endplate changes Cord: Normal signal. Posterior Fossa, vertebral arteries, paraspinal tissues: Unremarkable posterior fossa. Preserved vertebral artery flow voids. 2.3 x 3.8 x 2.9 cm (AP x transverse x craniocaudal) fluid collection in the laminectomy bed at C3-4 with moderately thick rim enhancement. More superficial 2.1 x 4.1 x 6.0 cm fluid collection within the subcutaneous tissues along the posterior cervical incision extending from C3-C7, also with peripheral enhancement. Enhancement in the surrounding soft tissues. Disc levels: C2-3: Left facet ankylosis.  No stenosis. C3-4: Posterior decompression and fusion. Patent spinal canal. Limited foraminal assessment due to artifact. C4-5: Posterior decompression and fusion. Patent spinal canal. Limited foraminal assessment due to artifact. C5-6: Posterior decompression and fusion. Mild residual spinal stenosis based on sagittal images. Suspected moderate residual left neural foraminal stenosis. C6-7: Disc bulging, uncovertebral spurring, and facet arthrosis result in mild spinal stenosis and suspected moderate to severe right and moderate left neural foraminal stenosis. C7-T1: Mild disc bulging, uncovertebral spurring, and left facet arthrosis without evidence of significant stenosis. IMPRESSION: 1. Severely motion degraded examination. 2. C3-C6 posterior decompression and fusion with improved spinal canal patency. 4 cm fluid collection in the laminectomy bed at C3-4 and 6 cm superficial fluid collection along the posterior  incision with infection/abscess is not excluded by  imaging. 3. Mild spinal stenosis and moderate to severe right and moderate left neural foraminal stenosis at C6-7. Electronically Signed   By: Sebastian Ache M.D.   On: 04/16/2023 16:42   CT ABDOMEN PELVIS WO CONTRAST Result Date: 04/16/2023 CLINICAL DATA:  Fall. EXAM: CT ABDOMEN AND PELVIS WITHOUT CONTRAST TECHNIQUE: Multidetector CT imaging of the abdomen and pelvis was performed following the standard protocol without IV contrast. RADIATION DOSE REDUCTION: This exam was performed according to the departmental dose-optimization program which includes automated exposure control, adjustment of the mA and/or kV according to patient size and/or use of iterative reconstruction technique. COMPARISON:  CT abdomen/pelvis dated June 03, 2021. FINDINGS: Lower chest: Moderate-sized hiatal hernia, unchanged. No acute abnormality. Hepatobiliary: Hepatic steatosis. No focal liver abnormality is seen. Status post cholecystectomy. No biliary dilatation. Pancreas: Unremarkable. No pancreatic ductal dilatation or surrounding inflammatory changes. Spleen: Normal in size without focal abnormality. Adrenals/Urinary Tract: Adrenal glands are unremarkable. Nonobstructive small bilateral renal calculi, measuring up to 3 mm. No hydronephrosis. Bladder is unremarkable. Stomach/Bowel: Moderate-sized hiatal hernia. The stomach is otherwise unremarkable. Status post appendectomy. Mildly dilated transverse and descending colon without evidence of obstruction. No inflammatory changes. Sigmoid colonic diverticulosis without evidence of acute diverticulitis. Vascular/Lymphatic: The abdominal aorta is normal in caliber. Aortic atherosclerosis. No enlarged abdominal or pelvic lymph nodes. Reproductive: Status post hysterectomy. No adnexal masses. Other: No abdominopelvic ascites.  No intraperitoneal free air. Musculoskeletal: No acute osseous abnormality. Multilevel degenerative changes of the  thoracic and lumbar spine with stable ankylosis at T12-L1. No suspicious lytic or blastic osseous lesion. IMPRESSION: 1. No acute localizing findings in the abdomen or pelvis. 2. Moderate-sized hiatal hernia, unchanged. 3. Nonobstructive small bilateral renal calculi.  No hydronephrosis. 4. Sigmoid colonic diverticulosis without evidence of acute diverticulitis. 5. Hepatic steatosis Aortic Atherosclerosis (ICD10-I70.0). Electronically Signed   By: Hart Robinsons M.D.   On: 04/16/2023 11:59   DG Knee Complete 4 Views Right Result Date: 04/16/2023 CLINICAL DATA:  Fall. EXAM: RIGHT KNEE - COMPLETE 4+ VIEW COMPARISON:  Right knee radiographs dated 11/14/2019. FINDINGS: Status post right knee arthroplasty in appropriate alignment. Hardware appears intact. No acute fracture or dislocation. Similar hypertrophic osseous changes of the patella. Intra-articular bodies are noted. Mild prepatellar soft tissue swelling. IMPRESSION: Status post right knee arthroplasty.  No acute osseous abnormality. Electronically Signed   By: Hart Robinsons M.D.   On: 04/16/2023 11:39   DG Knee Complete 4 Views Left Result Date: 04/16/2023 CLINICAL DATA:  Fall. EXAM: LEFT KNEE - COMPLETE 4+ VIEW COMPARISON:  Left knee radiographs dated January 27, 2020. FINDINGS: Status post left total knee arthroplasty in appropriate alignment. Remote healed periprosthetic fracture deformity of the distal left femur. Hardware appears intact. No evidence of acute fracture or dislocation. There is a small joint effusion. Prepatellar soft tissue swelling. IMPRESSION: 1. Status post left total knee arthroplasty in appropriate alignment. No acute osseous abnormality. 2. Small knee joint effusion. Electronically Signed   By: Hart Robinsons M.D.   On: 04/16/2023 11:34   DG Chest 2 View Result Date: 04/16/2023 CLINICAL DATA:  Fall. EXAM: CHEST - 2 VIEW COMPARISON:  Chest radiograph dated September 16, 2022. FINDINGS: The heart size and mediastinal contours  are within normal limits. Moderate-to-large hiatal hernia is again noted. No focal consolidation, pleural effusion, or pneumothorax. Surgical clips project over the lower thorax and epigastric region. Postoperative changes of the cervical spine. No acute osseous abnormality. IMPRESSION: No acute findings in the chest. Electronically Signed   By: Criss Rosales  Lateef M.D.   On: 04/16/2023 11:30   DG Hip Unilat W or Wo Pelvis 2-3 Views Left Result Date: 04/16/2023 CLINICAL DATA:  Pain after fall 2 days ago. EXAM: DG HIP (WITH OR WITHOUT PELVIS) 2-3V LEFT COMPARISON:  Same day CT abdomen/pelvis. FINDINGS: There is no evidence of hip fracture or dislocation. Mild degenerative changes of the left hip. The sacroiliac joints and pubic symphysis are anatomically aligned. Degenerative changes of the visualized lower lumbar spine. IMPRESSION: No acute osseous abnormality. Electronically Signed   By: Hart Robinsons M.D.   On: 04/16/2023 11:22   CT HEAD WO CONTRAST ( ) Result Date: 04/16/2023 CLINICAL DATA:  Provided history: Head trauma, minor.  Neck trauma. EXAM: CT HEAD WITHOUT CONTRAST CT CERVICAL SPINE WITHOUT CONTRAST TECHNIQUE: Multidetector CT imaging of the head and cervical spine was performed following the standard protocol without intravenous contrast. Multiplanar CT image reconstructions of the cervical spine were also generated. RADIATION DOSE REDUCTION: This exam was performed according to the departmental dose-optimization program which includes automated exposure control, adjustment of the mA and/or kV according to patient size and/or use of iterative reconstruction technique. COMPARISON:  Head CT 08/18/2019. Cervical spine radiographs 04/02/2023. FINDINGS: CT HEAD FINDINGS Brain: Mild generalized cerebral atrophy. There is no acute intracranial hemorrhage. No demarcated cortical infarct. No extra-axial fluid collection. No evidence of an intracranial mass. No midline shift. Vascular: No hyperdense  vessel.  Atherosclerotic calcifications. Skull: No calvarial fracture or aggressive osseous lesion. Sinuses/Orbits: No mass or acute finding within the imaged orbits. 11 mm mucous retention cyst within the right maxillary sinus. 12 mm mucous retention cyst within the left maxillary sinus. CT CERVICAL SPINE FINDINGS A small portion of the C7 vertebral body is excluded from the field of view anteroinferiorly. Within this limitation, findings are as follows. Alignment: No significant spondylolisthesis. Skull base and vertebrae: The basion-dental and atlanto-dental intervals are maintained.No evidence of acute fracture to the cervical spine. Prior posterior decompression at the C3-C5 levels. A posterior spinal fusion construct spans the C3-C6 levels. Facet ankylosis on the left at C2-C3. Soft tissues and spinal canal: No prevertebral fluid or swelling. No visible canal hematoma. Disc levels: Postoperative changes as described above. Cervical spondylosis. Disc space narrowing is greatest at C5-C6 (advanced at this level). Central disc protrusion at C3-C4. Multilevel endplate spurring and uncovertebral hypertrophy. Streak/beam hardening artifact arising from spinal fusion hardware partially obscures the spinal canal at the operative levels. Within this limitation, there is no appreciable high-grade spinal canal stenosis. Multilevel bony neural foraminal narrowing. Upper chest: The right lung apex is excluded from the field of view. No consolidation within visualized portions of the left lung apex. No visible pneumothorax. IMPRESSION: CT head: 1. No evidence of an acute intracranial abnormality. 2. Mild cerebral atrophy. 3. Bilateral maxillary sinus mucous retention cysts (measuring up to 12 mm). CT cervical spine: 1. A small portion of the C7 vertebral body is excluded from the field of view anteroinferiorly. 2. Within this limitation, there is no evidence of an acute cervical spine fracture. 3. Cervical spondylosis and  postoperative changes as described. 4. Left C2-C3 facet ankylosis. Electronically Signed   By: Jackey Loge D.O.   On: 04/16/2023 11:15   CT Cervical Spine Wo Contrast Result Date: 04/16/2023 CLINICAL DATA:  Provided history: Head trauma, minor.  Neck trauma. EXAM: CT HEAD WITHOUT CONTRAST CT CERVICAL SPINE WITHOUT CONTRAST TECHNIQUE: Multidetector CT imaging of the head and cervical spine was performed following the standard protocol without intravenous contrast. Multiplanar CT image  reconstructions of the cervical spine were also generated. RADIATION DOSE REDUCTION: This exam was performed according to the departmental dose-optimization program which includes automated exposure control, adjustment of the mA and/or kV according to patient size and/or use of iterative reconstruction technique. COMPARISON:  Head CT 08/18/2019. Cervical spine radiographs 04/02/2023. FINDINGS: CT HEAD FINDINGS Brain: Mild generalized cerebral atrophy. There is no acute intracranial hemorrhage. No demarcated cortical infarct. No extra-axial fluid collection. No evidence of an intracranial mass. No midline shift. Vascular: No hyperdense vessel.  Atherosclerotic calcifications. Skull: No calvarial fracture or aggressive osseous lesion. Sinuses/Orbits: No mass or acute finding within the imaged orbits. 11 mm mucous retention cyst within the right maxillary sinus. 12 mm mucous retention cyst within the left maxillary sinus. CT CERVICAL SPINE FINDINGS A small portion of the C7 vertebral body is excluded from the field of view anteroinferiorly. Within this limitation, findings are as follows. Alignment: No significant spondylolisthesis. Skull base and vertebrae: The basion-dental and atlanto-dental intervals are maintained.No evidence of acute fracture to the cervical spine. Prior posterior decompression at the C3-C5 levels. A posterior spinal fusion construct spans the C3-C6 levels. Facet ankylosis on the left at C2-C3. Soft tissues and  spinal canal: No prevertebral fluid or swelling. No visible canal hematoma. Disc levels: Postoperative changes as described above. Cervical spondylosis. Disc space narrowing is greatest at C5-C6 (advanced at this level). Central disc protrusion at C3-C4. Multilevel endplate spurring and uncovertebral hypertrophy. Streak/beam hardening artifact arising from spinal fusion hardware partially obscures the spinal canal at the operative levels. Within this limitation, there is no appreciable high-grade spinal canal stenosis. Multilevel bony neural foraminal narrowing. Upper chest: The right lung apex is excluded from the field of view. No consolidation within visualized portions of the left lung apex. No visible pneumothorax. IMPRESSION: CT head: 1. No evidence of an acute intracranial abnormality. 2. Mild cerebral atrophy. 3. Bilateral maxillary sinus mucous retention cysts (measuring up to 12 mm). CT cervical spine: 1. A small portion of the C7 vertebral body is excluded from the field of view anteroinferiorly. 2. Within this limitation, there is no evidence of an acute cervical spine fracture. 3. Cervical spondylosis and postoperative changes as described. 4. Left C2-C3 facet ankylosis. Electronically Signed   By: Jackey Loge D.O.   On: 04/16/2023 11:15   DG Cervical Spine 2 or 3 views Result Date: 04/07/2023 CLINICAL DATA:  Recent cervical surgery and fall. EXAM: CERVICAL SPINE - 2-3 VIEW COMPARISON:  03/26/2023 FINDINGS: Status post posterior fusion of C3-C6. Hardware is intact. No evidence for loosening or fracture. The LOWER cervical levels are not well seen on the LATERAL view. Lung apices are clear. IMPRESSION: Status post posterior fusion of C3-C6.  No acute abnormality. Electronically Signed   By: Norva Pavlov M.D.   On: 04/07/2023 07:57   DG Cervical Spine 2 or 3 views Result Date: 04/04/2023 CLINICAL DATA:  Status post surgical fusion and recent fall. EXAM: CERVICAL SPINE - 2-3 VIEW COMPARISON:   March 10, 2023. FINDINGS: Status post surgical posterior fusion extending from C3-C6 with bilateral intrapedicular screw placement and posterior laminectomy. Good alignment of vertebral bodies is noted. No acute abnormality is noted. IMPRESSION: Postsurgical changes as noted above. Electronically Signed   By: Lupita Raider M.D.   On: 04/04/2023 10:36    Labs:  CBC: Recent Labs    04/21/23 0506 04/23/23 0545 04/24/23 0454 04/25/23 0508  WBC 15.0* 14.9* 14.4* 13.9*  HGB 8.5* 8.4* 8.6* 7.9*  HCT 25.6* 26.2* 27.0*  25.3*  PLT 513* 489* 515* 448*    COAGS: Recent Labs    05/13/22 0632 04/24/23 1534 04/25/23 0508  INR 1.1 1.4* 1.4*  APTT  --  49*  --     BMP: Recent Labs    04/21/23 0506 04/23/23 0545 04/24/23 0454 04/25/23 0508  NA 138 139 135 137  K 3.2* 3.6 3.5 3.2*  CL 98 97* 94* 99  CO2 30 31 29 27   GLUCOSE 92 95 96 90  BUN 12 14 12  7*  CALCIUM 8.0* 8.0* 8.2* 7.9*  CREATININE 0.41* 0.44 0.44 <0.30*  GFRNONAA >60 >60 >60 NOT CALCULATED    LIVER FUNCTION TESTS: Recent Labs    05/12/22 1740 06/01/22 1756 04/16/23 1222 04/18/23 0653  BILITOT 0.6 0.6 0.6  --   AST 26 28 16   --   ALT 49* 28 14  --   ALKPHOS 107 74 92  --   PROT 7.5 6.6 6.4*  --   ALBUMIN 3.9 3.6 2.5* 2.1*    TUMOR MARKERS: No results for input(s): "AFPTM", "CEA", "CA199", "CHROMGRNA" in the last 8760 hours.  Assessment and Plan:  68 yo with bilateral psoas fluid collections, likely infection.  Reviewed recent CT and bilateral psoas collections should be amendable to CT guided aspiration / drain placement.    Risks and benefits discussed with the patient including bleeding, infection, damage to adjacent structures, bowel perforation/fistula connection, and sepsis.  All of the patient's questions were answered, patient is agreeable to proceed. Consent signed and in chart.    Electronically Signed: Arn Medal, MD 04/25/2023, 10:05 AM

## 2023-04-25 NOTE — Progress Notes (Signed)
PT Cancellation Note  Patient Details Name: Summer Murphy MRN: [id number] DOB: 12-18-1955   Cancelled Treatment:    Reason Eval/Treat Not Completed: Patient at procedure or test/unavailable (IR for CT guided aspiration/drain placement). PT will continue to f/u with pt acutely as available and appropriate.    Alessandra Bevels [place] Voytko 04/25/2023, 1:06 PM

## 2023-04-25 NOTE — Progress Notes (Signed)
PHARMACY - ANTICOAGULATION CONSULT NOTE  Pharmacy Consult for Heparin Infusion Indication:  VTE Treatment, nonocclusive IVC thrombus  Allergies  Allergen Reactions   Librium [Chlordiazepoxide] Shortness Of Breath   Reglan [Metoclopramide] Hives and Other (See Comments)    hallucinations    Vanilla Shortness Of Breath    Pt reports allergy to vanilla extract only   Aspirin Hives   Nsaids Rash    Rash/flares asthma issues.   Azithromycin Other (See Comments)    Unsure of what the reaction was.   Buprenorphine Hcl Other (See Comments)    Unsure of what the reaction was.    Flexeril [Cyclobenzaprine]     hallucinations   Morphine Other (See Comments)    Unsure of reaction   Sulfa Antibiotics     Other reaction(s): Unknown   Tolmetin     Other Reaction: Allergy   Amlodipine Besylate Itching and Rash    arms, stomach and forehead   Iron Nausea And Vomiting    Patient Measurements: Height: 5' 2.99" (160 cm) Weight: 56.8 kg (125 lb 3.5 oz) IBW/kg (Calculated) : 52.38 Heparin Dosing Weight: 56.8 kg  Vital Signs: Temp: 98.2 F (36.8 C) (02/02 0514) Temp Source: Oral (02/02 0514) BP: 130/66 (02/02 0514) Pulse Rate: 92 (02/02 0514)  Labs: Recent Labs    04/23/23 0545 04/24/23 0454 04/24/23 1534 04/24/23 2213 04/25/23 0508  HGB 8.4* 8.6*  --   --  7.9*  HCT 26.2* 27.0*  --   --  25.3*  PLT 489* 515*  --   --  448*  APTT  --   --  49*  --   --   LABPROT  --   --  17.5*  --   --   INR  --   --  1.4*  --   --   HEPARINUNFRC  --   --   --  <0.10* 0.10*  CREATININE 0.44 0.44  --   --   --     Estimated Creatinine Clearance: 56.4 mL/min (by C-G formula based on SCr of 0.44 mg/dL).   Medical History: Past Medical History:  Diagnosis Date   Acid reflux    Anemia    Anxiety    a.) buspirone BID + BZO PRN (clonazepam)   Arthritis    Asthma    Cervical disc disorder with radiculopathy of cervical region    Chronic pain syndrome    a.) on COT as prescribed by  pain mangement   Chronic, continuous use of opioids    a.) followed by pain mangement   COPD (chronic obstructive pulmonary disease) (HCC)    DOE (dyspnea on exertion)    Hematuria    Hiatal hernia    History of kidney stones    Hypertension    Hypoglycemia    Insomnia    a.) uses Trazodone PRN   LBBB (left bundle branch block)    Migraine    Murmur    Osteopenia    Palpitations    Pre-diabetes    Restless leg    a.) on ropinirole   Stenosis of cervical spine with myelopathy (HCC)    T2DM (type 2 diabetes mellitus) (HCC)     Medications:  Previously on enoxaparin 40 mg SQ q24h for DVT ppx- last dose 1/31 @ 2043  Assessment: Patient is a 68 year old female currently being treated for MSSA bacteremia and abscess/discitis at cervical fusion site. She is s/p drainage and was started on cefazolin and rifampin. CT abdomen/pelvis today showed  nonocclusive thrombus in IVC. After discussion with vascular, MD decided to start heparin infusion and continue antibiotics in case it is an infected thrombus. Pharmacy was consulted to initiate patient on a heparin infusion.   Baseline INR and aPTT ordered.  No signs/symptoms of bleeding noted in chart. Hgb 8.6 (stable). PLT 515.  Per MD, vascular plans to take patient to surgery tomorrow. Will go ahead and start heparin now with the plan to stop heparin 2/2 @ 0700  Goal of Therapy:  Heparin level 0.3-0.7 units/ml Monitor platelets by anticoagulation protocol: Yes   Plan:  2/2:  HL @ 0508 = 0.1, SUBtherapeutic  - Heparin gtt d/c'd @ 0700 in anticipation of surgery later on 2/2   Bayard More D, PharmD Clinical Pharmacist  04/25/2023,6:58 AM

## 2023-04-25 NOTE — Progress Notes (Signed)
PHARMACY - ANTICOAGULATION CONSULT NOTE  Pharmacy Consult for Heparin Infusion Indication:  VTE Treatment, nonocclusive IVC thrombus  Allergies  Allergen Reactions   Librium [Chlordiazepoxide] Shortness Of Breath   Reglan [Metoclopramide] Hives and Other (See Comments)    hallucinations    Vanilla Shortness Of Breath    Pt reports allergy to vanilla extract only   Aspirin Hives   Nsaids Rash    Rash/flares asthma issues.   Azithromycin Other (See Comments)    Unsure of what the reaction was.   Buprenorphine Hcl Other (See Comments)    Unsure of what the reaction was.    Flexeril [Cyclobenzaprine]     hallucinations   Morphine Other (See Comments)    Unsure of reaction   Sulfa Antibiotics     Other reaction(s): Unknown   Tolmetin     Other Reaction: Allergy   Amlodipine Besylate Itching and Rash    arms, stomach and forehead   Iron Nausea And Vomiting   Patient Measurements: Height: 5' 2.99" (160 cm) Weight: 56.8 kg (125 lb 3.5 oz) IBW/kg (Calculated) : 52.38 Heparin Dosing Weight: 56.8 kg  Vital Signs: Temp: 98.3 F (36.8 C) (02/02 1224) Temp Source: Oral (02/02 1224) BP: 153/82 (02/02 1224) Pulse Rate: 98 (02/02 1224)  Labs: Recent Labs    04/23/23 0545 04/24/23 0454 04/24/23 1534 04/24/23 2213 04/25/23 0508  HGB 8.4* 8.6*  --   --  7.9*  HCT 26.2* 27.0*  --   --  25.3*  PLT 489* 515*  --   --  448*  APTT  --   --  49*  --   --   LABPROT  --   --  17.5*  --  17.7*  INR  --   --  1.4*  --  1.4*  HEPARINUNFRC  --   --   --  <0.10* 0.10*  CREATININE 0.44 0.44  --   --  <0.30*   CrCl cannot be calculated (This lab value cannot be used to calculate CrCl because it is not a number: <0.30).  Medical History: Past Medical History:  Diagnosis Date   Acid reflux    Anemia    Anxiety    a.) buspirone BID + BZO PRN (clonazepam)   Arthritis    Asthma    Cervical disc disorder with radiculopathy of cervical region    Chronic pain syndrome    a.) on COT  as prescribed by pain mangement   Chronic, continuous use of opioids    a.) followed by pain mangement   COPD (chronic obstructive pulmonary disease) (HCC)    DOE (dyspnea on exertion)    Hematuria    Hiatal hernia    History of kidney stones    Hypertension    Hypoglycemia    Insomnia    a.) uses Trazodone PRN   LBBB (left bundle branch block)    Migraine    Murmur    Osteopenia    Palpitations    Pre-diabetes    Restless leg    a.) on ropinirole   Stenosis of cervical spine with myelopathy (HCC)    T2DM (type 2 diabetes mellitus) (HCC)    Medications:  Previously on enoxaparin 40 mg SQ q24h for DVT ppx- last dose 1/31 @ 2043  Assessment: Patient is a 68 year old female currently being treated for MSSA bacteremia and abscess/discitis at cervical fusion site. She is s/p drainage and was started on cefazolin and rifampin. CT abdomen/pelvis today showed nonocclusive thrombus in IVC.  After discussion with vascular, MD decided to start heparin infusion and continue antibiotics in case it is an infected thrombus. MD wants heparin to continue post procedure 2/1. Pharmacy was consulted to initiate patient on a heparin infusion.   Baseline Labs: Hgb 8.6 (stable). PLT 515. Hbg dropped today 7.9   Goal of Therapy:  Heparin level 0.3-0.7 units/ml Monitor platelets by anticoagulation protocol: Yes   Plan:  Restart previous heparin gtt rate 6 hours after procedure Start heparin at 900 units/hr @ 1800 Per nurse procedure ended around 1200 Per nurse no current signs of bleeding post procedure Check Heparin level in 6 hours Monitor CBC daily while on heparin   Effie Shy, PharmD Pharmacy Resident  04/25/2023 4:32 PM

## 2023-04-25 NOTE — Progress Notes (Signed)
Spoke to pharmacist regarding hep gtt, RPH will d/c 'ed order as appropriate.

## 2023-04-25 NOTE — Progress Notes (Signed)
PROGRESS NOTE    Summer Murphy  ZOX:[id number]  DOB: Jul 25, 1955  DOA: 04/16/2023 PCP: [name], NP Outpatient Specialists:   Hospital course:  68 year old female with DM2, recent cervical fusion last month and chronic hip arthritis was admitted 04/16/2023 with sepsis after fall.  Workup revealed MSSA bacteremia and abscess/discitis at cervical fusion site.  Patient underwent drainage And Was Placed on cefazolin and rifampin.  Patient subsequently had lower back and bilateral leg pain however MRI showed only multilevel DJD although it was motion degraded.  Patient is presently awaiting SNF placement, PICC line is in place for 6 weeks of IV Kefzol.  Copied and pasted from previous note: 1/25: Afebrile this morning, blood cultures growing Staph aureus-pending susceptibility.  Hemoglobin decreased to 7.3 without any obvious bleeding. Type and screen was done and it shows positive antibody-ordered 1 unit of PRBC and also consulted neurosurgery as there is no other obvious source of infection.  Started on cefazolin as there is no resistance detected. Patient now meets the criteria for sepsis with fever and tachycardia, Staph aureus bacteremia.  Echocardiogram also ordered. Neurosurgery is going to take her to the OR for washout.   1/26: Preliminary postprocedural culture with no organism, anemia panel consistent with anemia of chronic disease and iron deficiency.  Started on supplement. Repeat blood cultures ordered.  Urine cultures with no growth   1/27: Repeat blood cultures with no growth in 24-hour, neurosurgery obtained lumbar MRI as she was complaining of lower back and bilateral leg pain, it was motion degraded study but did show multilevel degenerative changes and neural foraminal narrowing. Cervical wound culture with some Staph aureus.  TTE with EF of 45 to 50%, some concern of regional wall motion abnormalities likely due to LBBB, discussed with cardiology and they are  recommending outpatient follow-up.  Appointment was set with Dr. Mariah Milling in February. Pending final recommendations from ID.   1/28: Hemodynamically stable, continue to have lower back and leg pain.  Cervical drain remain in place.  ID is recommending at least 6 weeks of IV cefazolin followed by another 4 to 6 weeks of p.o. antibiotics.  PICC line was placed today.  Repeat blood cultures negative for 48 hours. 1/29: drain removed.  1/30-1/31: patient doing well. PT updated recs to SNF at dc. TOC informed. She is cleared to dc when bed available. Continue pain control, IV abx course as recommended by ID and mobilize patient as much as possible. Staple removal by neurosurgery 14 days post-op. Healing well.  2/1: Patient with severe left groin pain.  Workup reveals bilateral psoas abscesses.  Also noted to have incomplete IVC clot.  Started on heparin. 2/2: Patient underwent CT drainage of bilateral psoas abscesses.  Subjective:  This morning patient is in severe pain.  Patient states she has been in severe acute pain in her right groin for the past 45 minutes.  Notes that she has not had pain like this before.  Notes that she is due for her pain medications but has not received them.   Objective: Vitals:   04/25/23 1124 04/25/23 1134 04/25/23 1140 04/25/23 1224  BP: (!) 164/82 118/66 (!) 152/82 (!) 153/82  Pulse: 95 92 89 98  Resp: 20 17 15 18   Temp:      TempSrc:      SpO2: 100% 95% 97% 97%  Weight:      Height:        Intake/Output Summary (Last 24 hours) at 04/25/2023 1351 Last data filed  at 04/25/2023 0900 Gross per 24 hour  Intake 1171.67 ml  Output --  Net 1171.67 ml   Filed Weights   04/16/23 1007 04/16/23 2139 04/24/23 1500  Weight: 59.2 kg 56.8 kg 56.8 kg     Exam:  General: Patient lying comfortably in bed appreciative of the care she has gotten Eyes: sclera anicteric, conjuctiva mild injection bilaterally CVS: S1-S2, regular  Respiratory:  decreased air entry  bilaterally secondary to decreased inspiratory effort, rales at bases  GI: NABS, soft, some tenderness as expected at I&D site.  No drain noted, no erythema. LE: No edema Neuro: A/O x 3,  grossly nonfocal.    Data Reviewed:  Basic Metabolic Panel: Recent Labs  Lab 04/20/23 0501 04/21/23 0506 04/23/23 0545 04/24/23 0454 04/25/23 0508  NA 134* 138 139 135 137  K 4.9 3.2* 3.6 3.5 3.2*  CL 98 98 97* 94* 99  CO2 25 30 31 29 27   GLUCOSE 102* 92 95 96 90  BUN 11 12 14 12  7*  CREATININE 0.38* 0.41* 0.44 0.44 <0.30*  CALCIUM 7.9* 8.0* 8.0* 8.2* 7.9*  MG  --  1.6*  --   --   --     CBC: Recent Labs  Lab 04/20/23 0501 04/21/23 0506 04/23/23 0545 04/24/23 0454 04/25/23 0508  WBC 14.7* 15.0* 14.9* 14.4* 13.9*  NEUTROABS  --   --   --   --  11.3*  HGB 9.0* 8.5* 8.4* 8.6* 7.9*  HCT 27.6* 25.6* 26.2* 27.0* 25.3*  MCV 70.6* 70.1* 73.0* 71.4* 74.4*  PLT 432* 513* 489* 515* 448*     Scheduled Meds:  acetaminophen  650 mg Oral Q4H   busPIRone  10 mg Oral BID   celecoxib  100 mg Oral BID   Chlorhexidine Gluconate Cloth  6 each Topical Daily   diclofenac Sodium  2 g Topical QID   DULoxetine  60 mg Oral Daily   Fe Fum-Vit C-Vit B12-FA  1 capsule Oral BID   fluticasone furoate-vilanterol  1 puff Inhalation Daily   gabapentin  900 mg Oral BID   methocarbamol  750 mg Oral TID   montelukast  10 mg Oral QHS   oxyCODONE  5 mg Oral QID   pantoprazole  20 mg Oral Daily   rifampin  300 mg Oral Q12H   rOPINIRole  1 mg Oral QHS   sodium chloride flush  10-40 mL Intracatheter Q12H   sodium chloride flush  3 mL Intravenous Q12H   traZODone  100 mg Oral QHS   Continuous Infusions:   ceFAZolin (ANCEF) IV 2 g (04/25/23 1219)     Assessment & Plan:   Bilateral psoas abscesses seen on CT yesterday CT scan yesterday revealed fluid collections in bilateral psoas: 3 x 6 cm on the right and a 3 x 8 cmon the left Underwent I&D this morning under CT guidance via IR Patient states that she  does feel better with less pain Discussed yesterday with ID, no need to change antibiotics at present, continue cefazolin and rifampin Aspirate was sent for culture today and is pending Hope to see improvement in leukocytosis after aspiration  Soft tissue thickening at L3-4 seen on CT yesterday Concerning for paravertebral soft tissue infection Will order MRI with and without contrast to assess involvement of the disc space/discitis/osteomyelitis ID is following patient closely  Nonocclusive thrombus in IVC seen on CT yesterday Chronicity is unclear Concern for possible infected thrombus given MSSA bacteremia Discussed  with vascular, at present they recommend  treatment with heparin and prolonged antibiotics If blood cultures continue positive, they can be consulted Repeat blood cultures ordered for today  Cervical abscess at C3-3 6, s/p laminectomy and posterior fusion 03/02/2023 Status post I&D of superficial and deep abscesses These are growing out MSSA Seen by ID, continue cefazolin for 6 weeks followed by p.o. for 6 more weeks Rifampin 300 mg p.o. twice daily is added for hardware  Pain management  Patient does have less pain after I&D this morning Will continue oxycodone 4 times daily for another 3 doses and thereafter as needed No evidence of altered mental status/delirium  Patchy groundglass opacities seen on CT yesterday Involvement of RML and both lower lobes with bronchiectasis in the lingula, likely sequela of multifocal infection/inflammation At present patient is on room air, no signs of acute lung infection  DM 2 Under reasonable control on present regimen  Hyponatremia Hypomagnesemia Hypokalemia Resolved with treatment, will follow  Falls Imaging workup is negative for acute fracture PT and OT as warranted  Asthma No evidence of acute flare, continue home inhalers  Anxiety and depression Continue Cymbalta and BuSpar Continue as needed  clonazepam  GERD Continue PPI   DVT prophylaxis: Lovenox Code Status: Full Family Communication: Sister-in-law was at bedside, she is her closest relative, she has cousins who are going to be visiting her as well.     Studies: CT ABDOMEN PELVIS W WO CONTRAST Result Date: 04/24/2023 CLINICAL DATA:  Follow-up psoas muscle abscess. EXAM: CT ABDOMEN AND PELVIS WITHOUT AND WITH CONTRAST TECHNIQUE: Multidetector CT imaging of the abdomen and pelvis was performed following the standard protocol before and following the bolus administration of intravenous contrast. RADIATION DOSE REDUCTION: This exam was performed according to the departmental dose-optimization program which includes automated exposure control, adjustment of the mA and/or kV according to patient size and/or use of iterative reconstruction technique. CONTRAST:  80mL OMNIPAQUE IOHEXOL 300 MG/ML  SOLN COMPARISON:  04/16/2023 FINDINGS: Lower chest: Patchy areas of ground-glass attenuation are again seen within the right middle lobe in both lower lobes. Bronchiectasis identified within the lingula. No pleural fluid or consolidative change. Hepatobiliary: No suspicious liver lesion. Status post cholecystectomy. Mild intrahepatic bile duct dilatation. Common bile duct measures 8 mm in diameter. This is unchanged compared with the previous exam. In the absence of clinical signs or symptoms of biliary obstruction these findings likely represent post cholecystectomy physiology. Clinical correlation advised. Pancreas: Unremarkable. No pancreatic ductal dilatation or surrounding inflammatory changes. Spleen: Normal in size without focal abnormality. Adrenals/Urinary Tract: Normal adrenal glands. No kidney mass or obstructive uropathy. The bladder appears within normal limits. Stomach/Bowel: Postoperative changes about the GE junction with signs of recurrent moderate hiatal hernia. No pathologic dilatation of the large or small bowel loops to suggest  obstruction. No bowel wall thickening, inflammation, or distension. Mild stool burden identified within the transverse colon. Vascular/Lymphatic: Aortic atherosclerosis. No aneurysm. There is a small mural based filling defect involving the IVC at the level of the bifurcation which appears mural based and may be adherent to the wall of the IVC compatible with thrombus, image 43/7. This does not appear to be occlusive. No signs of abdominopelvic adenopathy. Reproductive: Status post hysterectomy. No adnexal masses. Other: Peripherally enhancing fluid collection within the left psoas muscle measures 3.0 by 2.2 by 8.3 cm, image 52/10. This is suboptimally visualized on the unenhanced CT from 04/16/2023. Within the right psoas muscle there is a complex, rim enhancing fluid collection containing small foci of gas measuring 3.2 x  3.3 by 6.1 cm. Along the medial margin of the psoas muscle there is a small punctate phos is of gas, image 42/7. Additionally, there is increased soft tissue thickening within the paravertebral space about the L3-4 level containing a small punctate focus of gas, image 35/7. Musculoskeletal: No acute osseous findings. Lumbar degenerative disc disease identified. No definite focal bone erosions involving the endplates at the L3-4 level where there is increased paravertebral soft tissue containing a small focus of gas. IMPRESSION: 1. There is a complex, rim enhancing fluid collection containing small foci of gas within the right psoas muscle measuring 3.2 x 3.3 x 6.1 cm. This is compatible with psoas abscess. 2. There is a peripherally enhancing fluid collection within the left psoas muscle measuring 3.0 x 2.2 x 8.3 cm. This is suboptimally visualized on the unenhanced CT from 04/16/2023. This is also compatible with psoas abscess. 3. Increased soft tissue thickening within the paravertebral space about the L3-4 level containing a small punctate focus of gas. This is concerning for paravertebral  soft tissue infection. No definite focal bone erosions involving the endplates at the L3-4 level. If clinically indicated repeat MRI with and without contrast material of the lumbar spine would be advised to assess for involvement of the disc space. 4. Nonocclusive filling defect within the IVC at the level of the bifurcation compatible with thrombus. 5. Postoperative changes about the GE junction with signs of recurrent moderate hiatal hernia. 6. Patchy areas of ground-glass attenuation are again seen within the right middle lobe in both lower lobes. Bronchiectasis identified within the lingula. Imaging findings are likely the sequelae of multifocal inflammation/infection. 7. Aortic Atherosclerosis (ICD10-I70.0). Electronically Signed   By: Signa Kell M.D.   On: 04/24/2023 14:27   CT HIP RIGHT W CONTRAST Result Date: 04/24/2023 CLINICAL DATA:  Acute right hip pain with limping.  Swelling. EXAM: CT OF THE LOWER RIGHT EXTREMITY WITH CONTRAST TECHNIQUE: Multidetector CT imaging of the lower right extremity was performed according to the standard protocol following intravenous contrast administration. RADIATION DOSE REDUCTION: This exam was performed according to the departmental dose-optimization program which includes automated exposure control, adjustment of the mA and/or kV according to patient size and/or use of iterative reconstruction technique. CONTRAST:  80mL OMNIPAQUE IOHEXOL 300 MG/ML  SOLN COMPARISON:  CT pelvis 04/16/2023 FINDINGS: Bones/Joint/Cartilage Chondrocalcinosis in the pubic symphysis. Mild to moderate degenerative chondral thinning in the right hip. Ligaments Suboptimally assessed by CT. Muscles and Tendons Compared to 04/16/2023 there appears to be expansion of the right distal psoas muscle concerning for inflammation or infection. Reviewing the psoas musculature on recent lumbar MRI of 04/19/2023, the region is highly obscured and blurred by motion artifact, but given the findings on  today's exam I do have concern both psoas muscles may have accentuated T2 signal which could be indicators of inflammation, hematoma, or infection. Contrast enhanced CT abdomen/pelvis with and without contrast, if feasible, might be utilized to reassess the bilateral psoas musculature with less detrimental effect of motion artifact. Soft tissues Trace subcutaneous edema lateral to the right hip. Mild sigmoid colon diverticulosis. IMPRESSION: 1. Abnormal expansion and likely inflammation/edema of the distal right psoas muscle. Severely limited 04/19/2023 lumbar MRI due to motion artifact clouds assessment, but in the context of today's CT findings, concern is raised for bilateral psoas muscle inflammation/infection and discitis-osteomyelitis at the L3-4 level. 2. Mild to moderate degenerative chondral thinning in the right hip. 3. Chondrocalcinosis in the pubic symphysis. 4. Mild sigmoid colon diverticulosis. Electronically Signed  By: Gaylyn Rong M.D.   On: 04/24/2023 11:33    Principal Problem:   Sepsis (HCC) Active Problems:   T2DM (type 2 diabetes mellitus) (HCC)   Fever   Falls   Chronic pain syndrome   Asthma   Restless leg   Migraine   Anxiety and depression   Acid reflux   Electrolyte abnormality   Microcytic anemia   MSSA bacteremia   Surgical site infection     Elmina Hendel Orma Flaming, [facility]  If 7PM-7AM, please contact night-coverage [email or web address]   LOS: 8 days

## 2023-04-25 NOTE — Procedures (Signed)
Interventional Radiology Procedure:   Indications: Bilateral psoas collections  Procedure: CT guided aspiration of right psoas and CT guided aspiration / drain placement in left psoas abscess  Findings: Minimal bloody fluid aspirated from right psoas.  Purulent fluid aspirated from left psoas abscess and placed a 10 Fr drain.  However, the drain was not well positioned and tracking along periphery of the abscess.  Therefore, the drain was removed at end of procedure.   Complications: None     EBL: Minimal  Plan: Send both collections for culture.  Will likely need follow up imaging to see if the collections change.  Linley Moskal R. Lowella Dandy, MD  Pager: [phone]

## 2023-04-25 NOTE — Progress Notes (Signed)
   04/25/23 1314  Mobility  Activity Refused mobility   Pt declined mobility due to pain from procedure and soreness. Asked if she could ambulate tomorrow.   Johnathan Hausen Mobility Specialist 04/25/23, 1:15 PM

## 2023-04-25 NOTE — Progress Notes (Signed)
Patient left unit for IR at this time.

## 2023-04-26 DIAGNOSIS — T8149XA Infection following a procedure, other surgical site, initial encounter: Secondary | ICD-10-CM | POA: Diagnosis not present

## 2023-04-26 DIAGNOSIS — G9341 Metabolic encephalopathy: Secondary | ICD-10-CM

## 2023-04-26 DIAGNOSIS — M4646 Discitis, unspecified, lumbar region: Secondary | ICD-10-CM

## 2023-04-26 DIAGNOSIS — M4622 Osteomyelitis of vertebra, cervical region: Secondary | ICD-10-CM

## 2023-04-26 DIAGNOSIS — R6520 Severe sepsis without septic shock: Secondary | ICD-10-CM | POA: Diagnosis not present

## 2023-04-26 DIAGNOSIS — A4101 Sepsis due to Methicillin susceptible Staphylococcus aureus: Secondary | ICD-10-CM | POA: Diagnosis not present

## 2023-04-26 DIAGNOSIS — K6812 Psoas muscle abscess: Secondary | ICD-10-CM | POA: Diagnosis not present

## 2023-04-26 DIAGNOSIS — M4620 Osteomyelitis of vertebra, site unspecified: Secondary | ICD-10-CM

## 2023-04-26 DIAGNOSIS — R7881 Bacteremia: Secondary | ICD-10-CM | POA: Diagnosis not present

## 2023-04-26 DIAGNOSIS — B9561 Methicillin susceptible Staphylococcus aureus infection as the cause of diseases classified elsewhere: Secondary | ICD-10-CM | POA: Diagnosis not present

## 2023-04-26 LAB — BASIC METABOLIC PANEL
Anion gap: 11 (ref 5–15)
BUN: 6 mg/dL — ABNORMAL LOW (ref 8–23)
CO2: 29 mmol/L (ref 22–32)
Calcium: 8.1 mg/dL — ABNORMAL LOW (ref 8.9–10.3)
Chloride: 97 mmol/L — ABNORMAL LOW (ref 98–111)
Creatinine, Ser: 0.38 mg/dL — ABNORMAL LOW (ref 0.44–1.00)
GFR, Estimated: >60 mL/min (ref >60)
Glucose, Bld: 105 mg/dL — ABNORMAL HIGH (ref 70–99)
Potassium: 3.6 mmol/L (ref 3.5–5.1)
Sodium: 137 mmol/L (ref 135–145)

## 2023-04-26 LAB — CBC
HCT: 25.6 % — ABNORMAL LOW (ref 36.0–46.0)
Hemoglobin: 8 g/dL — ABNORMAL LOW (ref 12.0–15.0)
MCH: 22.9 pg — ABNORMAL LOW (ref 26.0–34.0)
MCHC: 31.3 g/dL (ref 30.0–36.0)
MCV: 73.4 fL — ABNORMAL LOW (ref 80.0–100.0)
Platelets: 491 10*3/uL — ABNORMAL HIGH (ref 150–400)
RBC: 3.49 MIL/uL — ABNORMAL LOW (ref 3.87–5.11)
RDW: 23.9 % — ABNORMAL HIGH (ref 11.5–15.5)
WBC: 13.3 10*3/uL — ABNORMAL HIGH (ref 4.0–10.5)
nRBC: 0 % (ref 0.0–0.2)

## 2023-04-26 LAB — HEPARIN LEVEL (UNFRACTIONATED)
Heparin Unfractionated: 0.12 [IU]/mL — ABNORMAL LOW (ref 0.30–0.70)
Heparin Unfractionated: 0.17 [IU]/mL — ABNORMAL LOW (ref 0.30–0.70)
Heparin Unfractionated: 0.27 [IU]/mL — ABNORMAL LOW (ref 0.30–0.70)
Heparin Unfractionated: 0.37 [IU]/mL (ref 0.30–0.70)

## 2023-04-26 MED ORDER — HEPARIN BOLUS VIA INFUSION
1700.0000 [IU] | Freq: Once | INTRAVENOUS | Status: AC
  Administered 2023-04-26: 1700 [IU] via INTRAVENOUS
  Filled 2023-04-26: qty 1700

## 2023-04-26 MED ORDER — LOPERAMIDE HCL 2 MG PO CAPS: 2.0000 mg | ORAL_CAPSULE | ORAL | Status: DC | PRN

## 2023-04-26 MED ORDER — HEPARIN BOLUS VIA INFUSION
800.0000 [IU] | Freq: Once | INTRAVENOUS | Status: AC
  Administered 2023-04-27: 800 [IU] via INTRAVENOUS
  Filled 2023-04-26: qty 800

## 2023-04-26 NOTE — Progress Notes (Signed)
PHARMACY - ANTICOAGULATION CONSULT NOTE  Pharmacy Consult for Heparin Infusion Indication:  VTE Treatment, nonocclusive IVC thrombus  Allergies  Allergen Reactions   Librium [Chlordiazepoxide] Shortness Of Breath   Reglan [Metoclopramide] Hives and Other (See Comments)    hallucinations    Vanilla Shortness Of Breath    Pt reports allergy to vanilla extract only   Aspirin Hives   Nsaids Rash    Rash/flares asthma issues.   Azithromycin Other (See Comments)    Unsure of what the reaction was.   Buprenorphine Hcl Other (See Comments)    Unsure of what the reaction was.    Flexeril [Cyclobenzaprine]     hallucinations   Morphine Other (See Comments)    Unsure of reaction   Sulfa Antibiotics     Other reaction(s): Unknown   Tolmetin     Other Reaction: Allergy   Amlodipine Besylate Itching and Rash    arms, stomach and forehead   Iron Nausea And Vomiting   Patient Measurements: Height: 5' 2.99" (160 cm) Weight: 56.8 kg (125 lb 3.5 oz) IBW/kg (Calculated) : 52.38 Heparin Dosing Weight: 56.8 kg  Vital Signs: Temp: 98.4 F (36.9 C) (02/02 2004) BP: 138/95 (02/02 2004) Pulse Rate: 103 (02/02 2004)  Labs: Recent Labs    04/24/23 0454 04/24/23 1534 04/24/23 2213 04/25/23 0508 04/26/23 0033 04/26/23 0630  HGB 8.6*  --   --  7.9*  --  8.0*  HCT 27.0*  --   --  25.3*  --  25.6*  PLT 515*  --   --  448*  --  491*  APTT  --  49*  --   --   --   --   LABPROT  --  17.5*  --  17.7*  --   --   INR  --  1.4*  --  1.4*  --   --   HEPARINUNFRC  --   --    < > 0.10* 0.12* 0.17*  CREATININE 0.44  --   --  <0.30*  --  0.38*   < > = values in this interval not displayed.   Estimated Creatinine Clearance: 56.4 mL/min (A) (by C-G formula based on SCr of 0.38 mg/dL (L)).  Medical History: Past Medical History:  Diagnosis Date   Acid reflux    Anemia    Anxiety    a.) buspirone BID + BZO PRN (clonazepam)   Arthritis    Asthma    Cervical disc disorder with radiculopathy  of cervical region    Chronic pain syndrome    a.) on COT as prescribed by pain mangement   Chronic, continuous use of opioids    a.) followed by pain mangement   COPD (chronic obstructive pulmonary disease) (HCC)    DOE (dyspnea on exertion)    Hematuria    Hiatal hernia    History of kidney stones    Hypertension    Hypoglycemia    Insomnia    a.) uses Trazodone PRN   LBBB (left bundle branch block)    Migraine    Murmur    Osteopenia    Palpitations    Pre-diabetes    Restless leg    a.) on ropinirole   Stenosis of cervical spine with myelopathy (HCC)    T2DM (type 2 diabetes mellitus) (HCC)    Medications:  Previously on enoxaparin 40 mg SQ q24h for DVT ppx- last dose 1/31 @ 2043  Assessment: Patient is a 68 year old female currently being treated for  MSSA bacteremia and abscess/discitis at cervical fusion site. She is s/p drainage and was started on cefazolin and rifampin. CT abdomen/pelvis today showed nonocclusive thrombus in IVC. After discussion with vascular, MD decided to start heparin infusion and continue antibiotics in case it is an infected thrombus. MD wants heparin to continue post procedure 2/1. Pharmacy was consulted to initiate patient on a heparin infusion.   Baseline Labs: Hgb 8.6 (stable). PLT 515. Hbg dropped today 7.9    2/3 0630 HL 0.17 SUBtherapeutic    Goal of Therapy:  Heparin level 0.3-0.7 units/ml Monitor platelets by anticoagulation protocol: Yes   Plan:  2/3: HL @ 0033 = 0.12, SUBtherapeutic  - Will order heparin 1700 units IV X 1 bolus and increase drip rate from 1100 to 1300 units/hr - Will recheck HL 6 hrs after rate change - CBC daily per protocol  Tanysha Quant A, PharmD 04/26/2023 7:58 AM

## 2023-04-26 NOTE — TOC Progression Note (Signed)
Transition of Care [facility]) - Progression Note    Patient Details  Name: Summer Murphy MRN: [id number] Date of Birth: 09/29/1955  Transition of Care [facility]) CM/SW Contact  Allena Katz, LCSW Phone Number: 04/26/2023, 10:18 AM  Clinical Narrative:   Pt experiencing groin pain and had a CT drainage of bilateral psoas abscesses. TOC to continue to follow and will restart authorization when medically stable.      Barriers to Discharge: Continued Medical Work up (Intractable pain episode, provider consulted, may need IR.)  Expected Discharge Plan and Services                                               Social Determinants of Health (SDOH) Interventions SDOH Screenings   Food Insecurity: No Food Insecurity (04/18/2023)  Housing: Low Risk  (04/18/2023)  Transportation Needs: No Transportation Needs (04/18/2023)  Utilities: Not At Risk (04/16/2023)  Alcohol Screen: Low Risk  (12/31/2021)  Depression (PHQ2-9): Low Risk  (01/27/2023)  Social Connections: Moderately Isolated (04/16/2023)  Tobacco Use: Low Risk  (04/16/2023)    Readmission Risk Interventions     No data to display

## 2023-04-26 NOTE — Progress Notes (Signed)
PHARMACY - ANTICOAGULATION CONSULT NOTE  Pharmacy Consult for Heparin Infusion Indication:  VTE Treatment, nonocclusive IVC thrombus  Allergies  Allergen Reactions   Librium [Chlordiazepoxide] Shortness Of Breath   Reglan [Metoclopramide] Hives and Other (See Comments)    hallucinations    Vanilla Shortness Of Breath    Pt reports allergy to vanilla extract only   Aspirin Hives   Nsaids Rash    Rash/flares asthma issues.   Azithromycin Other (See Comments)    Unsure of what the reaction was.   Buprenorphine Hcl Other (See Comments)    Unsure of what the reaction was.    Flexeril [Cyclobenzaprine]     hallucinations   Morphine Other (See Comments)    Unsure of reaction   Sulfa Antibiotics     Other reaction(s): Unknown   Tolmetin     Other Reaction: Allergy   Amlodipine Besylate Itching and Rash    arms, stomach and forehead   Iron Nausea And Vomiting   Patient Measurements: Height: 5' 2.99" (160 cm) Weight: 56.8 kg (125 lb 3.5 oz) IBW/kg (Calculated) : 52.38 Heparin Dosing Weight: 56.8 kg  Vital Signs: Temp: 98.9 F (37.2 C) (02/03 1958) BP: 144/86 (02/03 1958) Pulse Rate: 100 (02/03 1958)  Labs: Recent Labs    04/24/23 0454 04/24/23 1534 04/24/23 2213 04/25/23 0508 04/26/23 0033 04/26/23 0630 04/26/23 1448 04/26/23 2048  HGB 8.6*  --   --  7.9*  --  8.0*  --   --   HCT 27.0*  --   --  25.3*  --  25.6*  --   --   PLT 515*  --   --  448*  --  491*  --   --   APTT  --  49*  --   --   --   --   --   --   LABPROT  --  17.5*  --  17.7*  --   --   --   --   INR  --  1.4*  --  1.4*  --   --   --   --   HEPARINUNFRC  --   --    < > 0.10*   < > 0.17* 0.37 0.27*  CREATININE 0.44  --   --  <0.30*  --  0.38*  --   --    < > = values in this interval not displayed.   Estimated Creatinine Clearance: 56.4 mL/min (A) (by C-G formula based on SCr of 0.38 mg/dL (L)).  Medical History: Past Medical History:  Diagnosis Date   Acid reflux    Anemia    Anxiety     a.) buspirone BID + BZO PRN (clonazepam)   Arthritis    Asthma    Cervical disc disorder with radiculopathy of cervical region    Chronic pain syndrome    a.) on COT as prescribed by pain mangement   Chronic, continuous use of opioids    a.) followed by pain mangement   COPD (chronic obstructive pulmonary disease) (HCC)    DOE (dyspnea on exertion)    Hematuria    Hiatal hernia    History of kidney stones    Hypertension    Hypoglycemia    Insomnia    a.) uses Trazodone PRN   LBBB (left bundle branch block)    Migraine    Murmur    Osteopenia    Palpitations    Pre-diabetes    Restless leg    a.)  on ropinirole   Stenosis of cervical spine with myelopathy (HCC)    T2DM (type 2 diabetes mellitus) (HCC)    Medications:  Previously on enoxaparin 40 mg SQ q24h for DVT ppx- last dose 1/31 @ 2043  Assessment: Patient is a 68 year old female currently being treated for MSSA bacteremia and abscess/discitis at cervical fusion site. She is s/p drainage and was started on cefazolin and rifampin. CT abdomen/pelvis today showed nonocclusive thrombus in IVC. After discussion with vascular, MD decided to start heparin infusion and continue antibiotics in case it is an infected thrombus. MD wants heparin to continue post procedure 2/1. Pharmacy was consulted to initiate patient on a heparin infusion.    Baseline Labs: Hgb 8.6 (stable). PLT 515. Hbg dropped today 7.9   2/3 0630 HL 0.17 SUBtherapeutic 2/3 1448 HL 0.37 Therapeutic x 1 2/3 2048 HL 0.27 Subtherapeutic    Goal of Therapy:  Heparin level 0.3-0.7 units/ml Monitor platelets by anticoagulation protocol: Yes  Update: Patient only has one PICC currently due to other IV infiltrating. Discussed with MD and RN >> in case no additional PIV placed, it is suggested to perform the following prior to drawing heparin level to avoid heparin level contamination by the drip: A. Turning the IV UFH infusion off for 10 minutes B. Flush with  at least 10 mL saline C. Wasting a small volume of blood (around 5 mL) before obtaining the venipuncture sample to be used for testing D. Performing venipuncture distal to the intravenous site by at least 3 inches      Plan: Bolus 800 units x 1 Increase heparin infusion to 1400 units/hour. Recheck HL w/ AM labs after rate change Continue to monitor CBC daily while on heparin infusion.   Otelia Sergeant, PharmD, MBA 04/26/2023 11:12 PM

## 2023-04-26 NOTE — Progress Notes (Signed)
PROGRESS NOTE Summer Murphy  ZOX:[id number]  DOB: 04-23-1955  DOA: 04/16/2023 PCP: [name], NP  Hospital course: 68 year old female with DM2, recent cervical fusion last month and chronic hip arthritis was admitted 04/16/2023 with sepsis after fall.  Workup revealed MSSA bacteremia and abscess/discitis at cervical fusion site.  Patient underwent drainage and was placed on cefazolin and rifampin.  Patient subsequently had lower back and bilateral leg pain however MRI showed only multilevel DJD although it was motion degraded.  Patient is presently awaiting SNF placement, PICC line is in place for 6 weeks of IV Kefzol.  1/27: Repeat blood cultures with no growth in 24-hour, neurosurgery obtained lumbar MRI as she was complaining of lower back and bilateral leg pain, it was motion degraded study but did show multilevel degenerative changes and neural foraminal narrowing. Cervical wound culture with some Staph aureus.  TTE with EF of 45 to 50%, some concern of regional wall motion abnormalities likely due to LBBB, discussed with cardiology and they are recommending outpatient follow-up.  Appointment was set with Dr. Mariah Milling in February.   1/28: Hemodynamically stable, continue to have lower back and leg pain.  Cervical drain remain in place.  ID is recommending at least 6 weeks of IV cefazolin followed by another 4 to 6 weeks of p.o. antibiotics.  PICC line was placed today.  Repeat blood cultures negative for 48 hours. 1/29: drain removed.  1/30-1/31: patient doing well. PT updated recs to SNF at dc. TOC informed. She is cleared to dc when bed available. Continue pain control, IV abx course as recommended by ID and mobilize patient as much as possible. Staple removal by neurosurgery 14 days post-op. Healing well.  2/1: Patient with severe left groin pain.  Workup reveals bilateral psoas abscesses.  Also noted to have incomplete IVC clot.  Started on heparin. 2/2: Patient underwent CT  drainage of bilateral psoas abscesses. 2/3: doing well. Awaiting abscess culture results. Repeat blood cultures NGTD. Awaiting SNF placement within next couple days  Subjective: Continues to have right groin pain. Denies pain at insertion site of the needle for abscess aspiration.   Objective: Blood pressure 127/73, pulse (!) 104, temperature 98.1 F (36.7 C), resp. rate 19, height 5' 2.99" (1.6 m), weight 56.8 kg, SpO2 97%.  Exam:  General: Patient sitting in chair with legs elevated Eyes: sclera anicteric, conjuctiva mild injection bilaterally CVS: S1-S2, regular  Respiratory:  normal effort. CTAB GI: no abdominal tenderness or bloating.  MSK, LE: No edema LE. Tenderness to palpation R groin. No drainage on aspiration site posteriorly.  Neuro: A/O x 3,  grossly nonfocal.   Data Reviewed:  Basic Metabolic Panel: Recent Labs  Lab 04/20/23 0501 04/21/23 0506 04/23/23 0545 04/24/23 0454 04/25/23 0508  NA 134* 138 139 135 137  K 4.9 3.2* 3.6 3.5 3.2*  CL 98 98 97* 94* 99  CO2 25 30 31 29 27   GLUCOSE 102* 92 95 96 90  BUN 11 12 14 12  7*  CREATININE 0.38* 0.41* 0.44 0.44 <0.30*  CALCIUM 7.9* 8.0* 8.0* 8.2* 7.9*  MG  --  1.6*  --   --   --     CBC: Recent Labs  Lab 04/21/23 0506 04/23/23 0545 04/24/23 0454 04/25/23 0508 04/26/23 0630  WBC 15.0* 14.9* 14.4* 13.9* 13.3*  NEUTROABS  --   --   --  11.3*  --   HGB 8.5* 8.4* 8.6* 7.9* 8.0*  HCT 25.6* 26.2* 27.0* 25.3* 25.6*  MCV 70.1* 73.0* 71.4*  74.4* 73.4*  PLT 513* 489* 515* 448* 491*     Scheduled Meds:  acetaminophen  650 mg Oral Q4H   busPIRone  10 mg Oral BID   celecoxib  100 mg Oral BID   Chlorhexidine Gluconate Cloth  6 each Topical Daily   diclofenac Sodium  2 g Topical QID   DULoxetine  60 mg Oral Daily   Fe Fum-Vit C-Vit B12-FA  1 capsule Oral BID   fluticasone furoate-vilanterol  1 puff Inhalation Daily   gabapentin  900 mg Oral BID   methocarbamol  750 mg Oral TID   montelukast  10 mg Oral QHS    oxyCODONE  5 mg Oral QID   pantoprazole  20 mg Oral Daily   rifampin  300 mg Oral Q12H   rOPINIRole  1 mg Oral QHS   sodium chloride flush  10-40 mL Intracatheter Q12H   sodium chloride flush  3 mL Intravenous Q12H   traZODone  100 mg Oral QHS   Continuous Infusions:   ceFAZolin (ANCEF) IV 2 g (04/26/23 0640)   heparin 1,100 Units/hr (04/26/23 0121)   Assessment & Plan:   Cervical abscess at C3-3 6, s/p laminectomy and posterior fusion 03/02/2023. Neurosurgery removed drain 1/29. Status post I&D of superficial and deep abscesses These are growing out MSSA Seen by ID, continue cefazolin for 6 weeks followed by p.o. for 6 more weeks Rifampin 300 mg p.o. twice daily is added for hardware Staple removal by neurosurgery sometime around 2/8  Bilateral psoas abscesses seen on CT- fluid collections in bilateral psoas: 3 x 6 cm on the right and a 3 x 8 cm on the left Underwent I&D 2/2. Minimal bloody fluid aspirated from right and small amount of purulent fluid aspirated on left.  - continue cefazolin and rifampin Aspirate was sent for cultureand is pending Will likely need follow up imaging to see if the collections change, per IR - analgesia PRN  Discitis-osteomyelitis L3-4- management as directed by ID for additional conditions  Nonocclusive thrombus in IVC seen on CT 2/2 Chronicity is unclear Concern for possible infected thrombus given MSSA bacteremia Discussed  with vascular, at present they recommend treatment with heparin and prolonged antibiotics If blood cultures continue positive, they can be consulted Repeat blood cultures NGTD  Patchy groundglass opacities seen on CT Involvement of RML and both lower lobes with bronchiectasis in the lingula, likely sequela of multifocal infection/inflammation At present patient is on room air, no signs of acute lung infection  DM 2- diet controlled  Hyponatremia Hypomagnesemia Hypokalemia Resolved with treatment  Falls Imaging  workup is negative for acute fracture PT and OT following, dc to SNF  Asthma No evidence of acute flare, continue home inhalers  Anxiety and depression Continue Cymbalta and BuSpar Continue as needed clonazepam  GERD Continue PPI   DVT prophylaxis: Lovenox Code Status: Full Family Communication: none at bedside  Studies: CT GUIDED SOFT TISSUE FLUID DRAIN BY PERC CATH Result Date: 04/25/2023 INDICATION: 68 year old with bilateral groin pain and recent CT imaging demonstrates bilateral psoas fluid collections that are concerning for infection. EXAM: 1. CT-guided aspiration of right psoas muscle 2. CT-guided placement of drain in left psoas abscess-drain was removed at the end of the procedure MEDICATIONS: Moderate sedation ANESTHESIA/SEDATION: Moderate (conscious) sedation was employed during this procedure. A total of Versed 1mg  and fentanyl 100 mcg was administered intravenously at the order of the provider performing the procedure. Total intra-service moderate sedation time: 40 minutes. Patient's level of consciousness and  vital signs were monitored continuously by radiology nurse throughout the procedure under the supervision of the provider performing the procedure. COMPLICATIONS: None immediate. PROCEDURE: Informed written consent was obtained from the patient after a thorough discussion of the procedural risks, benefits and alternatives. All questions were addressed. Maximal Sterile Barrier Technique was utilized including caps, mask, sterile gowns, sterile gloves, sterile drape, hand hygiene and skin antiseptic. A timeout was performed prior to the initiation of the procedure. Patient was placed prone. CT images through the lower abdomen were obtained. The back was prepped and draped in sterile fashion. Skin was anesthetized with 1% lidocaine. Using CT guidance, an 18 gauge trocar needle was directed into the left psoas muscle near the small pockets of air. No purulent fluid could be  aspirated. Attempted to advance a wire into the psoas muscle but wire would not advance very far. Needle was repositioned but again no significant purulent fluid could be aspirated. Small drops of bloody fluid were obtained. This fluid was collected for culture. Attention was directed to the left psoas. Skin was anesthetized with 1% lidocaine. Using CT guidance, a new 18 gauge trocar needle was directed into the left psoas muscle and bloody purulent fluid was draining from the needle. Superstiff Amplatz wire was advanced into the left psoas muscle. Tract was dilated to accommodate a 10 [place] multipurpose drain. Follow up CT images demonstrated that the drain was not tracking along the expected course of the abscess. Rather the drain was along the anterior aspect of the psoas muscle. The drain was pulled back and tried to reposition with a wire but again drain was not appropriately positioned. As a result, the drain was completely removed at the end of the procedure. RADIATION DOSE REDUCTION: This exam was performed according to the departmental dose-optimization program which includes automated exposure control, adjustment of the mA and/or kV according to patient size and/or use of iterative reconstruction technique. FINDINGS: Small foci of gas in the right psoas muscle. Needle was directed into the right psoas muscle but no purulent fluid could be aspirated. Small amount of bloody material was collected for culture. Needle was directed into left psoas abscess and bloody purulent fluid was aspirated. Approximately 5 mL of bloody purulent fluid was removed from the left psoas muscle. A drain was placed but the drain appeared to be tracking anterior to the psoas muscle and not adequately positioned to drain the abscess. Therefore this drain was removed. IMPRESSION: 1. CT-guided placement of a drain in the left psoas abscess. 5 mL of bloody purulent fluid was removed. However, the drain was not well positioned and  eventually removed at the end of the procedure. 2. CT-guided aspiration of a small amount of bloody fluid from the right psoas muscle. Fluid was sent for culture. Electronically Signed   By: Richarda Overlie M.D.   On: 04/25/2023 18:31   CT ASPIRATION N/S Result Date: 04/25/2023 INDICATION: 68 year old with bilateral groin pain and recent CT imaging demonstrates bilateral psoas fluid collections that are concerning for infection. EXAM: 1. CT-guided aspiration of right psoas muscle 2. CT-guided placement of drain in left psoas abscess-drain was removed at the end of the procedure MEDICATIONS: Moderate sedation ANESTHESIA/SEDATION: Moderate (conscious) sedation was employed during this procedure. A total of Versed 1mg  and fentanyl 100 mcg was administered intravenously at the order of the provider performing the procedure. Total intra-service moderate sedation time: 40 minutes. Patient's level of consciousness and vital signs were monitored continuously by radiology nurse throughout the procedure under  the supervision of the provider performing the procedure. COMPLICATIONS: None immediate. PROCEDURE: Informed written consent was obtained from the patient after a thorough discussion of the procedural risks, benefits and alternatives. All questions were addressed. Maximal Sterile Barrier Technique was utilized including caps, mask, sterile gowns, sterile gloves, sterile drape, hand hygiene and skin antiseptic. A timeout was performed prior to the initiation of the procedure. Patient was placed prone. CT images through the lower abdomen were obtained. The back was prepped and draped in sterile fashion. Skin was anesthetized with 1% lidocaine. Using CT guidance, an 18 gauge trocar needle was directed into the left psoas muscle near the small pockets of air. No purulent fluid could be aspirated. Attempted to advance a wire into the psoas muscle but wire would not advance very far. Needle was repositioned but again no  significant purulent fluid could be aspirated. Small drops of bloody fluid were obtained. This fluid was collected for culture. Attention was directed to the left psoas. Skin was anesthetized with 1% lidocaine. Using CT guidance, a new 18 gauge trocar needle was directed into the left psoas muscle and bloody purulent fluid was draining from the needle. Superstiff Amplatz wire was advanced into the left psoas muscle. Tract was dilated to accommodate a 10 [place] multipurpose drain. Follow up CT images demonstrated that the drain was not tracking along the expected course of the abscess. Rather the drain was along the anterior aspect of the psoas muscle. The drain was pulled back and tried to reposition with a wire but again drain was not appropriately positioned. As a result, the drain was completely removed at the end of the procedure. RADIATION DOSE REDUCTION: This exam was performed according to the departmental dose-optimization program which includes automated exposure control, adjustment of the mA and/or kV according to patient size and/or use of iterative reconstruction technique. FINDINGS: Small foci of gas in the right psoas muscle. Needle was directed into the right psoas muscle but no purulent fluid could be aspirated. Small amount of bloody material was collected for culture. Needle was directed into left psoas abscess and bloody purulent fluid was aspirated. Approximately 5 mL of bloody purulent fluid was removed from the left psoas muscle. A drain was placed but the drain appeared to be tracking anterior to the psoas muscle and not adequately positioned to drain the abscess. Therefore this drain was removed. IMPRESSION: 1. CT-guided placement of a drain in the left psoas abscess. 5 mL of bloody purulent fluid was removed. However, the drain was not well positioned and eventually removed at the end of the procedure. 2. CT-guided aspiration of a small amount of bloody fluid from the right psoas muscle.  Fluid was sent for culture. Electronically Signed   By: Richarda Overlie M.D.   On: 04/25/2023 18:31   MR Lumbar Spine W Wo Contrast Result Date: 04/25/2023 CLINICAL DATA:  Low back pain, infection suspected, positive xray/CT EXAM: MRI LUMBAR SPINE WITHOUT AND WITH CONTRAST TECHNIQUE: Multiplanar and multiecho pulse sequences of the lumbar spine were obtained without and with intravenous contrast. CONTRAST:  5mL GADAVIST GADOBUTROL 1 MMOL/ML IV SOLN COMPARISON:  MRI 04/19/2023, CT 04/24/2023 FINDINGS: Segmentation:  Standard. Alignment: Trace retrolisthesis of L1 on L2 and L5 on S1. Slight levocurvature. Vertebrae: Small amount of fluid within the L3-L4 disc space with endplate irregularity and increasing bone marrow edema in the L3 and L4 vertebral bodies. Corresponding low T1 bone marrow signal. There is also increasing bone marrow edema and low T1 signal changes associated  with the L4-L5 disc space. Small amount of enhancing fluid in the anterior epidural space between the L3 and L4 levels with suspected epidural abscess in the left lateral recess at the level of the L4 vertebral body (series 11, image 23). No fractures. Conus medullaris and cauda equina: Conus extends to the L2 level. Conus and cauda equina appear normal. Paraspinal and other soft tissues: Rim enhancing bilateral psoas muscle abscesses. No posterior paraspinal muscle abscess. Disc levels: T12-L1 through L2-L3: Degenerative disc disease and facet arthropathy, unchanged from prior. L3-L4: Degree of disc height loss appears progressed compared to the recent prior. Increasing disc bulge and endplate spurring. Mild-to-moderate bilateral facet arthropathy. Moderate-severe canal stenosis with moderate bilateral foraminal stenosis. Findings have progressed from prior. L4-L5: Progressed disc height loss with new central disc protrusion. Mild-to-moderate bilateral facet arthropathy. Mild canal stenosis with moderate bilateral foraminal stenosis. Findings have  progressed from prior. L5-S1:. Disc bulge and endplate spurring, eccentric to the right. Mild bilateral facet arthropathy. Mild right subarticular recess stenosis without canal stenosis. Severe left and mild-moderate right foraminal stenosis. No appreciable change. IMPRESSION: 1. Findings consistent with discitis-osteomyelitis at L3-L4 and L4-L5. Small amount of enhancing fluid in the anterior epidural space between the L3 and L4 levels with suspected epidural abscess in the left lateral recess at the level of the L4 vertebral body. 2. Bilateral psoas muscle abscesses. 3. Multilevel lumbar spondylosis, most pronounced at L3-L4 where there is moderate-severe canal stenosis and moderate bilateral foraminal stenosis. 4. Mild canal stenosis at L4-L5 with moderate bilateral foraminal stenosis. 5. Severe left and mild-moderate right foraminal stenosis at L5-S1. Electronically Signed   By: Duanne Guess D.O.   On: 04/25/2023 16:05   CT ABDOMEN PELVIS W WO CONTRAST Result Date: 04/24/2023 CLINICAL DATA:  Follow-up psoas muscle abscess. EXAM: CT ABDOMEN AND PELVIS WITHOUT AND WITH CONTRAST TECHNIQUE: Multidetector CT imaging of the abdomen and pelvis was performed following the standard protocol before and following the bolus administration of intravenous contrast. RADIATION DOSE REDUCTION: This exam was performed according to the departmental dose-optimization program which includes automated exposure control, adjustment of the mA and/or kV according to patient size and/or use of iterative reconstruction technique. CONTRAST:  80mL OMNIPAQUE IOHEXOL 300 MG/ML  SOLN COMPARISON:  04/16/2023 FINDINGS: Lower chest: Patchy areas of ground-glass attenuation are again seen within the right middle lobe in both lower lobes. Bronchiectasis identified within the lingula. No pleural fluid or consolidative change. Hepatobiliary: No suspicious liver lesion. Status post cholecystectomy. Mild intrahepatic bile duct dilatation. Common  bile duct measures 8 mm in diameter. This is unchanged compared with the previous exam. In the absence of clinical signs or symptoms of biliary obstruction these findings likely represent post cholecystectomy physiology. Clinical correlation advised. Pancreas: Unremarkable. No pancreatic ductal dilatation or surrounding inflammatory changes. Spleen: Normal in size without focal abnormality. Adrenals/Urinary Tract: Normal adrenal glands. No kidney mass or obstructive uropathy. The bladder appears within normal limits. Stomach/Bowel: Postoperative changes about the GE junction with signs of recurrent moderate hiatal hernia. No pathologic dilatation of the large or small bowel loops to suggest obstruction. No bowel wall thickening, inflammation, or distension. Mild stool burden identified within the transverse colon. Vascular/Lymphatic: Aortic atherosclerosis. No aneurysm. There is a small mural based filling defect involving the IVC at the level of the bifurcation which appears mural based and may be adherent to the wall of the IVC compatible with thrombus, image 43/7. This does not appear to be occlusive. No signs of abdominopelvic adenopathy. Reproductive: Status post  hysterectomy. No adnexal masses. Other: Peripherally enhancing fluid collection within the left psoas muscle measures 3.0 by 2.2 by 8.3 cm, image 52/10. This is suboptimally visualized on the unenhanced CT from 04/16/2023. Within the right psoas muscle there is a complex, rim enhancing fluid collection containing small foci of gas measuring 3.2 x 3.3 by 6.1 cm. Along the medial margin of the psoas muscle there is a small punctate phos is of gas, image 42/7. Additionally, there is increased soft tissue thickening within the paravertebral space about the L3-4 level containing a small punctate focus of gas, image 35/7. Musculoskeletal: No acute osseous findings. Lumbar degenerative disc disease identified. No definite focal bone erosions involving the  endplates at the L3-4 level where there is increased paravertebral soft tissue containing a small focus of gas. IMPRESSION: 1. There is a complex, rim enhancing fluid collection containing small foci of gas within the right psoas muscle measuring 3.2 x 3.3 x 6.1 cm. This is compatible with psoas abscess. 2. There is a peripherally enhancing fluid collection within the left psoas muscle measuring 3.0 x 2.2 x 8.3 cm. This is suboptimally visualized on the unenhanced CT from 04/16/2023. This is also compatible with psoas abscess. 3. Increased soft tissue thickening within the paravertebral space about the L3-4 level containing a small punctate focus of gas. This is concerning for paravertebral soft tissue infection. No definite focal bone erosions involving the endplates at the L3-4 level. If clinically indicated repeat MRI with and without contrast material of the lumbar spine would be advised to assess for involvement of the disc space. 4. Nonocclusive filling defect within the IVC at the level of the bifurcation compatible with thrombus. 5. Postoperative changes about the GE junction with signs of recurrent moderate hiatal hernia. 6. Patchy areas of ground-glass attenuation are again seen within the right middle lobe in both lower lobes. Bronchiectasis identified within the lingula. Imaging findings are likely the sequelae of multifocal inflammation/infection. 7. Aortic Atherosclerosis (ICD10-I70.0). Electronically Signed   By: Signa Kell M.D.   On: 04/24/2023 14:27   CT HIP RIGHT W CONTRAST Result Date: 04/24/2023 CLINICAL DATA:  Acute right hip pain with limping.  Swelling. EXAM: CT OF THE LOWER RIGHT EXTREMITY WITH CONTRAST TECHNIQUE: Multidetector CT imaging of the lower right extremity was performed according to the standard protocol following intravenous contrast administration. RADIATION DOSE REDUCTION: This exam was performed according to the departmental dose-optimization program which includes  automated exposure control, adjustment of the mA and/or kV according to patient size and/or use of iterative reconstruction technique. CONTRAST:  80mL OMNIPAQUE IOHEXOL 300 MG/ML  SOLN COMPARISON:  CT pelvis 04/16/2023 FINDINGS: Bones/Joint/Cartilage Chondrocalcinosis in the pubic symphysis. Mild to moderate degenerative chondral thinning in the right hip. Ligaments Suboptimally assessed by CT. Muscles and Tendons Compared to 04/16/2023 there appears to be expansion of the right distal psoas muscle concerning for inflammation or infection. Reviewing the psoas musculature on recent lumbar MRI of 04/19/2023, the region is highly obscured and blurred by motion artifact, but given the findings on today's exam I do have concern both psoas muscles may have accentuated T2 signal which could be indicators of inflammation, hematoma, or infection. Contrast enhanced CT abdomen/pelvis with and without contrast, if feasible, might be utilized to reassess the bilateral psoas musculature with less detrimental effect of motion artifact. Soft tissues Trace subcutaneous edema lateral to the right hip. Mild sigmoid colon diverticulosis. IMPRESSION: 1. Abnormal expansion and likely inflammation/edema of the distal right psoas muscle. Severely limited 04/19/2023 lumbar  MRI due to motion artifact clouds assessment, but in the context of today's CT findings, concern is raised for bilateral psoas muscle inflammation/infection and discitis-osteomyelitis at the L3-4 level. 2. Mild to moderate degenerative chondral thinning in the right hip. 3. Chondrocalcinosis in the pubic symphysis. 4. Mild sigmoid colon diverticulosis. Electronically Signed   By: Gaylyn Rong M.D.   On: 04/24/2023 11:33    Principal Problem:   Sepsis (HCC) Active Problems:   T2DM (type 2 diabetes mellitus) (HCC)   Fever   Falls   Chronic pain syndrome   Asthma   Restless leg   Migraine   Anxiety and depression   Acid reflux   Electrolyte abnormality    Microcytic anemia   MSSA bacteremia   Surgical site infection     Leeroy Bock, [facility]  If 7PM-7AM, please contact night-coverage [email or web address]   LOS: 9 days

## 2023-04-26 NOTE — Progress Notes (Signed)
Occupational Therapy Treatment Patient Details Name: Summer Murphy MRN: [id number] DOB: 12-14-1955 Today's Date: 04/26/2023   History of present illness Pt admitted to [facility] on 04/16/23 under observation for c/o mechanical fall and fever. Cervical MRI significant for: 4 cm fluid collection in the laminectomy bed at C3-4 and 6 cm superficial fluid collection along the posterior incision with infection/abscess is not excluded by imaging; otherwise no acute abnormality found on imaging. Significant PMH includes: type 2 diabetes, asthma, arthritis s/p cervical fusion and anxiety. Now S/p CT drainage of bilateral psoas on 2/2.   OT comments  Pt seen for OT tx. Pt received in bed, RN removing IV in LUE 2/2 infiltration. Pt agreeable to session, requiring supv and VC for safety with bed mobility as she promptly goes from long sitting in bed to coming to EOB. VC for hand placement as she stands by pulling up on the RW requiring increased effort and more forward lean than necessary, particularly in light of cervical precautions and groin pain. Pt requires CGA for in-room mobility with VC to slow down. Pt tolerated standing at the sink with intermittent UE support on the counter for stability and to assist in comfort while in standing from bilat groin. Pt's bandage over recent IV site noted to be saturated. Clean paper towel placed and light pressure maintained. Pt returned to bed and RN notified. Pt progressing towards goals, still requiring VC for safety. Continues to benefit.       If plan is discharge home, recommend the following:  A little help with walking and/or transfers;A little help with bathing/dressing/bathroom;Assistance with cooking/housework;Assist for transportation;Help with stairs or ramp for entrance   Equipment Recommendations  Other (comment) (2WW)    Recommendations for Other Services      Precautions / Restrictions Precautions Precautions: Fall Precaution Comments: general spine  precautions Restrictions Weight Bearing Restrictions Per Provider Order: No       Mobility Bed Mobility Overal bed mobility: Needs Assistance Bed Mobility: Supine to Sit     Supine to sit: Supervision, HOB elevated     General bed mobility comments: HOB elevated, pt able to easily bring BLE over to the EOB    Transfers Overall transfer level: Needs assistance Equipment used: Rolling walker (2 wheels) Transfers: Sit to/from Stand Sit to Stand: Contact guard assist           General transfer comment: VC for hand placement as pt promptly holds onto the RW and stands with effort and requiring VC for cervical precautions     Balance Overall balance assessment: Needs assistance Sitting-balance support: Feet supported Sitting balance-Leahy Scale: Good     Standing balance support: Single extremity supported, No upper extremity supported, During functional activity Standing balance-Leahy Scale: Fair                             ADL either performed or assessed with clinical judgement   ADL Overall ADL's : Needs assistance/impaired     Grooming: Standing;Supervision/safety;Cueing for safety Grooming Details (indicate cue type and reason): intermittent forearm support on counter for stability, cues for compensatory strategies to limit cervical flexion and leaning into sink while rinsing/spitting during teethbrushing                             Functional mobility during ADLs: Contact guard assist;Cueing for safety;Rolling walker (2 wheels)      Extremity/Trunk Assessment  Vision       Perception     Praxis      Cognition Arousal: Alert Behavior During Therapy: WFL for tasks assessed/performed Overall Cognitive Status: No family/caregiver present to determine baseline cognitive functioning                                 General Comments: intermittentl demonstrating poor safety awareness, a bit impulsive         Exercises      Shoulder Instructions       General Comments      Pertinent Vitals/ Pain       Pain Assessment Pain Assessment: 0-10 Pain Score: 7  Pain Location: bilat groin Pain Descriptors / Indicators: Sore Pain Intervention(s): Limited activity within patient's tolerance, Monitored during session, Repositioned  Home Living                                          Prior Functioning/Environment              Frequency  Min 1X/week        Progress Toward Goals  OT Goals(current goals can now be found in the care plan section)  Progress towards OT goals: Progressing toward goals  Acute Rehab OT Goals Patient Stated Goal: go home OT Goal Formulation: With patient Time For Goal Achievement: 05/02/23 Potential to Achieve Goals: Good  Plan      Co-evaluation                 AM-PAC OT "6 Clicks" Daily Activity     Outcome Measure   Help from another person eating meals?: None Help from another person taking care of personal grooming?: A Little Help from another person toileting, which includes using toliet, bedpan, or urinal?: A Little Help from another person bathing (including washing, rinsing, drying)?: A Little Help from another person to put on and taking off regular upper body clothing?: A Little Help from another person to put on and taking off regular lower body clothing?: A Little 6 Click Score: 19    End of Session Equipment Utilized During Treatment: Rolling walker (2 wheels)  OT Visit Diagnosis: Muscle weakness (generalized) (M62.81);Repeated falls (R29.6)   Activity Tolerance Patient tolerated treatment well   Patient Left in bed;with call bell/phone within reach;with bed alarm set   Nurse Communication Other (comment) (recent IV site pressure bandage saturated with blood. OT placed paper towel over it for pt to hold and apply light pressure until RN was able to assess)        Time: 1359-1413 OT Time  Calculation (min): 14 min  Charges: OT General Charges $OT Visit: 1 Visit OT Treatments $Self Care/Home Management : 8-22 mins  Arman Filter., MPH, MS, OTR/L ascom [phone] 04/26/23, 3:49 PM

## 2023-04-26 NOTE — Progress Notes (Addendum)
PHARMACY - ANTICOAGULATION CONSULT NOTE  Pharmacy Consult for Heparin Infusion Indication:  VTE Treatment, nonocclusive IVC thrombus  Allergies  Allergen Reactions   Librium [Chlordiazepoxide] Shortness Of Breath   Reglan [Metoclopramide] Hives and Other (See Comments)    hallucinations    Vanilla Shortness Of Breath    Pt reports allergy to vanilla extract only   Aspirin Hives   Nsaids Rash    Rash/flares asthma issues.   Azithromycin Other (See Comments)    Unsure of what the reaction was.   Buprenorphine Hcl Other (See Comments)    Unsure of what the reaction was.    Flexeril [Cyclobenzaprine]     hallucinations   Morphine Other (See Comments)    Unsure of reaction   Sulfa Antibiotics     Other reaction(s): Unknown   Tolmetin     Other Reaction: Allergy   Amlodipine Besylate Itching and Rash    arms, stomach and forehead   Iron Nausea And Vomiting   Patient Measurements: Height: 5' 2.99" (160 cm) Weight: 56.8 kg (125 lb 3.5 oz) IBW/kg (Calculated) : 52.38 Heparin Dosing Weight: 56.8 kg  Vital Signs: Temp: 98.1 F (36.7 C) (02/03 1609) BP: 151/71 (02/03 1609) Pulse Rate: 94 (02/03 1609)  Labs: Recent Labs    04/24/23 0454 04/24/23 1534 04/24/23 2213 04/25/23 0508 04/26/23 0033 04/26/23 0630 04/26/23 1448  HGB 8.6*  --   --  7.9*  --  8.0*  --   HCT 27.0*  --   --  25.3*  --  25.6*  --   PLT 515*  --   --  448*  --  491*  --   APTT  --  49*  --   --   --   --   --   LABPROT  --  17.5*  --  17.7*  --   --   --   INR  --  1.4*  --  1.4*  --   --   --   HEPARINUNFRC  --   --    < > 0.10* 0.12* 0.17* 0.37  CREATININE 0.44  --   --  <0.30*  --  0.38*  --    < > = values in this interval not displayed.   Estimated Creatinine Clearance: 56.4 mL/min (A) (by C-G formula based on SCr of 0.38 mg/dL (L)).  Medical History: Past Medical History:  Diagnosis Date   Acid reflux    Anemia    Anxiety    a.) buspirone BID + BZO PRN (clonazepam)   Arthritis     Asthma    Cervical disc disorder with radiculopathy of cervical region    Chronic pain syndrome    a.) on COT as prescribed by pain mangement   Chronic, continuous use of opioids    a.) followed by pain mangement   COPD (chronic obstructive pulmonary disease) (HCC)    DOE (dyspnea on exertion)    Hematuria    Hiatal hernia    History of kidney stones    Hypertension    Hypoglycemia    Insomnia    a.) uses Trazodone PRN   LBBB (left bundle branch block)    Migraine    Murmur    Osteopenia    Palpitations    Pre-diabetes    Restless leg    a.) on ropinirole   Stenosis of cervical spine with myelopathy (HCC)    T2DM (type 2 diabetes mellitus) (HCC)    Medications:  Previously on  enoxaparin 40 mg SQ q24h for DVT ppx- last dose 1/31 @ 2043  Assessment: Patient is a 68 year old female currently being treated for MSSA bacteremia and abscess/discitis at cervical fusion site. She is s/p drainage and was started on cefazolin and rifampin. CT abdomen/pelvis today showed nonocclusive thrombus in IVC. After discussion with vascular, MD decided to start heparin infusion and continue antibiotics in case it is an infected thrombus. MD wants heparin to continue post procedure 2/1. Pharmacy was consulted to initiate patient on a heparin infusion.    Baseline Labs: Hgb 8.6 (stable). PLT 515. Hbg dropped today 7.9   2/3 0630 HL 0.17 SUBtherapeutic 2/3 1448 HL 0.37 Therapeutic x 1    Goal of Therapy:  Heparin level 0.3-0.7 units/ml Monitor platelets by anticoagulation protocol: Yes  Update: Patient only has one PICC currently due to other IV infiltrating. Discussed with MD and RN >> in case no additional PIV placed, it is suggested to perform the following prior to drawing heparin level to avoid heparin level contamination by the drip: A. Turning the IV UFH infusion off for 10 minutes B. Flush with at least 10 mL saline C. Wasting a small volume of blood (around 5 mL) before obtaining the  venipuncture sample to be used for testing D. Performing venipuncture distal to the intravenous site by at least 3 inches      Plan: Continue heparin infusion at 1300 units/hour. Check HL in 6 hours. Continue to monitor CBC daily while on heparin infusion.    Will M. Dareen Piano, PharmD Clinical Pharmacist 04/26/2023 5:05 PM

## 2023-04-26 NOTE — Progress Notes (Signed)
PHARMACY - ANTICOAGULATION CONSULT NOTE  Pharmacy Consult for Heparin Infusion Indication:  VTE Treatment, nonocclusive IVC thrombus  Allergies  Allergen Reactions   Librium [Chlordiazepoxide] Shortness Of Breath   Reglan [Metoclopramide] Hives and Other (See Comments)    hallucinations    Vanilla Shortness Of Breath    Pt reports allergy to vanilla extract only   Aspirin Hives   Nsaids Rash    Rash/flares asthma issues.   Azithromycin Other (See Comments)    Unsure of what the reaction was.   Buprenorphine Hcl Other (See Comments)    Unsure of what the reaction was.    Flexeril [Cyclobenzaprine]     hallucinations   Morphine Other (See Comments)    Unsure of reaction   Sulfa Antibiotics     Other reaction(s): Unknown   Tolmetin     Other Reaction: Allergy   Amlodipine Besylate Itching and Rash    arms, stomach and forehead   Iron Nausea And Vomiting   Patient Measurements: Height: 5' 2.99" (160 cm) Weight: 56.8 kg (125 lb 3.5 oz) IBW/kg (Calculated) : 52.38 Heparin Dosing Weight: 56.8 kg  Vital Signs: Temp: 98.4 F (36.9 C) (02/02 2004) BP: 138/95 (02/02 2004) Pulse Rate: 103 (02/02 2004)  Labs: Recent Labs    04/23/23 0545 04/24/23 0454 04/24/23 1534 04/24/23 2213 04/25/23 0508 04/26/23 0033  HGB 8.4* 8.6*  --   --  7.9*  --   HCT 26.2* 27.0*  --   --  25.3*  --   PLT 489* 515*  --   --  448*  --   APTT  --   --  49*  --   --   --   LABPROT  --   --  17.5*  --  17.7*  --   INR  --   --  1.4*  --  1.4*  --   HEPARINUNFRC  --   --   --  <0.10* 0.10* 0.12*  CREATININE 0.44 0.44  --   --  <0.30*  --    CrCl cannot be calculated (This lab value cannot be used to calculate CrCl because it is not a number: <0.30).  Medical History: Past Medical History:  Diagnosis Date   Acid reflux    Anemia    Anxiety    a.) buspirone BID + BZO PRN (clonazepam)   Arthritis    Asthma    Cervical disc disorder with radiculopathy of cervical region    Chronic pain  syndrome    a.) on COT as prescribed by pain mangement   Chronic, continuous use of opioids    a.) followed by pain mangement   COPD (chronic obstructive pulmonary disease) (HCC)    DOE (dyspnea on exertion)    Hematuria    Hiatal hernia    History of kidney stones    Hypertension    Hypoglycemia    Insomnia    a.) uses Trazodone PRN   LBBB (left bundle branch block)    Migraine    Murmur    Osteopenia    Palpitations    Pre-diabetes    Restless leg    a.) on ropinirole   Stenosis of cervical spine with myelopathy (HCC)    T2DM (type 2 diabetes mellitus) (HCC)    Medications:  Previously on enoxaparin 40 mg SQ q24h for DVT ppx- last dose 1/31 @ 2043  Assessment: Patient is a 68 year old female currently being treated for MSSA bacteremia and abscess/discitis at cervical fusion site.  She is s/p drainage and was started on cefazolin and rifampin. CT abdomen/pelvis today showed nonocclusive thrombus in IVC. After discussion with vascular, MD decided to start heparin infusion and continue antibiotics in case it is an infected thrombus. MD wants heparin to continue post procedure 2/1. Pharmacy was consulted to initiate patient on a heparin infusion.   Baseline Labs: Hgb 8.6 (stable). PLT 515. Hbg dropped today 7.9   Goal of Therapy:  Heparin level 0.3-0.7 units/ml Monitor platelets by anticoagulation protocol: Yes   Plan:  2/3: HL @ 0033 = 0.12, SUBtherapeutic  - Will order heparin 1700 units IV X 1 bolus and increase drip rate to 1100 units/hr - Will recheck HL 6 hrs after rate change  Marthena Whitmyer D, PharmD 04/26/2023 1:02 AM

## 2023-04-26 NOTE — Progress Notes (Signed)
Physical Therapy Treatment Patient Details Name: Summer Murphy MRN: [id number] DOB: Oct 17, 1955 Today's Date: 04/26/2023   History of Present Illness Patient is a 68 year old female with sepsis, wound infection, s/p cervical wound debridement. History of recent fall, C3-C6 laminectomy and posterior fusion 03/02/23, COPD, barrettts esophagus, hiatus hernia, HTN.    PT Comments  Pt was pleasant and motivated to participate during the session and put forth good effort throughout. Pt required min cuing for proper sequencing with log rolling with bed mobility and for sit to/from stand transfers but required no physical assist. Pt was generally steady with standing activities with no overt LOB and was able to amb a max of 20 feet this session before fatiguing and needing to return to sitting, SpO2 and HR WNL on room air.  Pt participated in below dynamic standing balance training with min instability but able to self-correct.  Pt will benefit from continued PT services upon discharge to safely address deficits listed in patient problem list for decreased caregiver assistance and eventual return to PLOF.      If plan is discharge home, recommend the following: A little help with walking and/or transfers;A little help with bathing/dressing/bathroom;Assist for transportation;Assistance with cooking/housework   Can travel by private vehicle     Yes  Equipment Recommendations  None recommended by PT    Recommendations for Other Services       Precautions / Restrictions Precautions Precautions: Fall Precaution Comments: general spine precautions Restrictions Weight Bearing Restrictions Per Provider Order: No     Mobility  Bed Mobility Overal bed mobility: Needs Assistance Bed Mobility: Rolling, Sidelying to Sit Rolling: Supervision Sidelying to sit: Supervision       General bed mobility comments: Log roll training with cues for sequencing but no physical assist needed     Transfers Overall transfer level: Needs assistance Equipment used: Rolling walker (2 wheels) Transfers: Sit to/from Stand Sit to Stand: Contact guard assist           General transfer comment: Min verbal cues for hand placement    Ambulation/Gait Ambulation/Gait assistance: Contact guard assist Gait Distance (Feet): 20 Feet Assistive device: Rolling walker (2 wheels) Gait Pattern/deviations: Step-through pattern, Decreased step length - right, Decreased step length - left Gait velocity: decreased     General Gait Details: Slow cadence with short B step length and cues for amb closer to the RW with upright posture   Stairs             Wheelchair Mobility     Tilt Bed    Modified Rankin (Stroke Patients Only)       Balance Overall balance assessment: Needs assistance Sitting-balance support: Feet supported Sitting balance-Leahy Scale: Good     Standing balance support: Reliant on assistive device for balance, During functional activity, Bilateral upper extremity supported Standing balance-Leahy Scale: Fair                              Cognition Arousal: Alert Behavior During Therapy: WFL for tasks assessed/performed Overall Cognitive Status: No family/caregiver present to determine baseline cognitive functioning                                          Exercises Other Exercises: dynamic standing balance training with feet apart and together with gentle reaching activities  Other Exercises:  Log roll and transfer training    General Comments        Pertinent Vitals/Pain Pain Assessment Pain Assessment: 0-10 Pain Score: 3  Pain Location: bilat groin Pain Descriptors / Indicators: Sore Pain Intervention(s): Premedicated before session, Monitored during session, Repositioned, Other (comment) (Pt declined pain meds)    Home Living                          Prior Function            PT Goals (current  goals can now be found in the care plan section) Progress towards PT goals: Progressing toward goals    Frequency    Min 1X/week      PT Plan      Co-evaluation              AM-PAC PT "6 Clicks" Mobility   Outcome Measure  Help needed turning from your back to your side while in a flat bed without using bedrails?: A Little Help needed moving from lying on your back to sitting on the side of a flat bed without using bedrails?: A Little Help needed moving to and from a bed to a chair (including a wheelchair)?: A Little Help needed standing up from a chair using your arms (e.g., wheelchair or bedside chair)?: A Little Help needed to walk in hospital room?: A Little Help needed climbing 3-5 steps with a railing? : A Lot 6 Click Score: 17    End of Session Equipment Utilized During Treatment: Gait belt Activity Tolerance: Patient tolerated treatment well Patient left: in chair;with call bell/phone within reach;with chair alarm set Nurse Communication: Mobility status PT Visit Diagnosis: Other abnormalities of gait and mobility (R26.89);Muscle weakness (generalized) (M62.81);Pain Pain - Right/Left:  (bilateral) Pain - part of body: Hip     Time: 8657-8469 PT Time Calculation (min) (ACUTE ONLY): 20 min  Charges:    $Gait Training: 8-22 mins PT General Charges $$ ACUTE PT VISIT: 1 Visit                    D. Scott Garnett Nunziata PT, DPT 04/26/23, 12:13 PM

## 2023-04-26 NOTE — Progress Notes (Signed)
   Neurosurgery Progress Note  History: Summer Murphy is s/p cervical wound debridement  POD9: Pt experiencing increased low back and leg pain over the weekend prompting MRI. She reports she is feeling better this morning although she does still have some right groin pain   POD5: Pt reporting improved back and neck pain with medication changes yesterday. POD4: continues to have back and bilateral leg pain this morning  POD3: Patient denies any neck pain this morning. She continues to complain of back pain. POD2: Pt complaining of increased low back and bilateral radiating leg pain since yesterday. POD1: She states her neck pain is better this morning. She has had no other complaints.   Physical Exam: Vitals:   04/25/23 2004 04/26/23 0849  BP: (!) 138/95 127/73  Pulse: (!) 103 (!) 104  Resp: 16 19  Temp: 98.4 F (36.9 C) 98.1 F (36.7 C)  SpO2: 99% 97%    AA Ox3. On the phone  CNI  Strength:5/5 throughout  Incision c/d/I with staples in place   Data:  MRI Lumbar spine 04/19/23: IMPRESSION: 1. Severely motion degraded study. Repeat study when clinically appropriate suggested. 2. Multilevel degenerative changes of the lumbar spine with likely moderate spinal canal stenosis at L2-3 and L4-5. 3. Multilevel neural foraminal narrowing, severe on the left at L5-S1.     Assessment/Plan:  Summer Murphy is a 68 y.o s/p cervical posterior decompression and fusion about 6 weeks ago presenting after a fall. She was found to be febrile with concerns for sepsis. Blood cultures positive for MSSA bacteremia and MRI C spine showing fluid collection. She returned to the OR on 1/25 for wound washout.   - MRI L spine 2/2 showing discitis-osteomyelitis and psoas abscesses. S/p CT drainage of bilateral psoas on 2/2 - mobilize - ok for DVT prophylaxis from neurosurgerical standpoint  - PTOT - HV removed 1/29. Incision with staples in place left open to air - will need staple removal  around 14 days post-op. Appointment has been made. Will complete this inpatient if she is still admitted.   Manning Charity PA-C Department of Neurosurgery

## 2023-04-26 NOTE — Progress Notes (Addendum)
PIV consult: LUE assessed with [place]: no suitable veins found for cannulation. It appears current medications are compatible to infuse via PICC, however pt reports lab is having difficulty drawing blood. (Single lumen was placed for home use.) Pt may benefit from exchange to double lumen PICC if pharmacy/ provider are OK with heparin levels coming from the second lumen. Discussed with Nehemiah Settle, Charity fundraiser.

## 2023-04-26 NOTE — Progress Notes (Signed)
Date of Admission:  04/16/2023     ID: Summer Murphy is a 68 y.o. female  Principal Problem:   Sepsis (HCC) Active Problems:   Migraine   Acid reflux   Restless leg   Microcytic anemia   Asthma   Anxiety and depression   T2DM (type 2 diabetes mellitus) (HCC)   Fever   Chronic pain syndrome   Falls   Electrolyte abnormality   MSSA bacteremia   Surgical site infection  Summer Murphy is a 68 y.o. with a history of cervical myelpathy for which she  underwent c3-c6 laminectomy and posterior fusion on 03/02/23, COPD, barrettts esophagus, hiatus hernia, HTN, followed by neurosurgery as OP and last seen by them on 04/12/23 with findings of overall wound healing well presented to the ED thru EMS from home on 04/16/23 after fall at home- as per the chart she was trying to reach for ehr walker and slipped and fell and could not get off the floor- 2 days prior to that she had slipped from her chair- she did not c/o fever or chills She had neck pain which was present since before surgery and not changed in intensity recently Over the weekend patient was having severe pain in the right groin and underwent CT scan and then MRI which showed Lumbar spine which showed discitis osteomyelitis at L3 and L4 and L4 and L5.  There was a small amount of enhancing fluid in the anterior epidural space with suspected epidural abscess.  Bilateral psoas muscle abscesses were seen. Patient underwent aspiration of the psoas abscess by IR on 04/25/2023 and culture has been sent.   Subjective: Patient is complaining of nausea feels like vomiting.  She has been getting Zofran.  Her appetite is poor  acetaminophen  650 mg Oral Q4H   busPIRone  10 mg Oral BID   celecoxib  100 mg Oral BID   Chlorhexidine Gluconate Cloth  6 each Topical Daily   diclofenac Sodium  2 g Topical QID   DULoxetine  60 mg Oral Daily   Fe Fum-Vit C-Vit B12-FA  1 capsule Oral BID   fluticasone furoate-vilanterol  1 puff Inhalation Daily    gabapentin  900 mg Oral BID   methocarbamol  750 mg Oral TID   montelukast  10 mg Oral QHS   oxyCODONE  5 mg Oral QID   pantoprazole  20 mg Oral Daily   rifampin  300 mg Oral Q12H   rOPINIRole  1 mg Oral QHS   sodium chloride flush  10-40 mL Intracatheter Q12H   sodium chloride flush  3 mL Intravenous Q12H   traZODone  100 mg Oral QHS    Objective: Vital signs in last 24 hours: Patient Vitals for the past 24 hrs:  BP Temp Pulse Resp SpO2  04/26/23 0849 127/73 98.1 F (36.7 C) (!) 104 19 97 %  04/25/23 2004 (!) 138/95 98.4 F (36.9 C) (!) 103 16 99 %  04/25/23 1636 112/73 98.9 F (37.2 C) (!) 101 18 97 %      PHYSICAL EXAM:  General: Alert, cooperative,  Neck: cervical area- surgical site open, staples present - drain removed Lungs: b/l air entry Heart: Regular rate and rhythm, no murmur, rub or gallop. Abdomen: Soft, non-tender,not distended. Bowel sounds normal. No masses Extremities: b/l knee scars Skin: No rashes or lesions. Or bruising Lymph: Cervical, supraclavicular normal. Neurologic: Grossly non-focal  Lab Results    Latest Ref Rng & Units 04/26/2023  6:30 AM 04/25/2023    5:08 AM 04/24/2023    4:54 AM  CBC  WBC 4.0 - 10.5 K/uL 13.3  13.9  14.4   Hemoglobin 12.0 - 15.0 g/dL 8.0  7.9  8.6   Hematocrit 36.0 - 46.0 % 25.6  25.3  27.0   Platelets 150 - 400 K/uL 491  448  515        Latest Ref Rng & Units 04/26/2023    6:30 AM 04/25/2023    5:08 AM 04/24/2023    4:54 AM  CMP  Glucose 70 - 99 mg/dL 161  90  96   BUN 8 - 23 mg/dL 6  7  12    Creatinine 0.44 - 1.00 mg/dL 0.96  <0.45  4.09   Sodium 135 - 145 mmol/L 137  137  135   Potassium 3.5 - 5.1 mmol/L 3.6  3.2  3.5   Chloride 98 - 111 mmol/L 97  99  94   CO2 22 - 32 mmol/L 29  27  29    Calcium 8.9 - 10.3 mg/dL 8.1  7.9  8.2       Microbiology: 04/16/23 BC MSSA both sets 04/17/23 surgical wound culture MSSA 04/18/23 BC- NG Studies/Results: CT GUIDED SOFT TISSUE FLUID DRAIN BY PERC CATH Result Date:  04/25/2023 INDICATION: 68 year old with bilateral groin pain and recent CT imaging demonstrates bilateral psoas fluid collections that are concerning for infection. EXAM: 1. CT-guided aspiration of right psoas muscle 2. CT-guided placement of drain in left psoas abscess-drain was removed at the end of the procedure MEDICATIONS: Moderate sedation ANESTHESIA/SEDATION: Moderate (conscious) sedation was employed during this procedure. A total of Versed 1mg  and fentanyl 100 mcg was administered intravenously at the order of the provider performing the procedure. Total intra-service moderate sedation time: 40 minutes. Patient's level of consciousness and vital signs were monitored continuously by radiology nurse throughout the procedure under the supervision of the provider performing the procedure. COMPLICATIONS: None immediate. PROCEDURE: Informed written consent was obtained from the patient after a thorough discussion of the procedural risks, benefits and alternatives. All questions were addressed. Maximal Sterile Barrier Technique was utilized including caps, mask, sterile gowns, sterile gloves, sterile drape, hand hygiene and skin antiseptic. A timeout was performed prior to the initiation of the procedure. Patient was placed prone. CT images through the lower abdomen were obtained. The back was prepped and draped in sterile fashion. Skin was anesthetized with 1% lidocaine. Using CT guidance, an 18 gauge trocar needle was directed into the left psoas muscle near the small pockets of air. No purulent fluid could be aspirated. Attempted to advance a wire into the psoas muscle but wire would not advance very far. Needle was repositioned but again no significant purulent fluid could be aspirated. Small drops of bloody fluid were obtained. This fluid was collected for culture. Attention was directed to the left psoas. Skin was anesthetized with 1% lidocaine. Using CT guidance, a new 18 gauge trocar needle was directed  into the left psoas muscle and bloody purulent fluid was draining from the needle. Superstiff Amplatz wire was advanced into the left psoas muscle. Tract was dilated to accommodate a 10 [place] multipurpose drain. Follow up CT images demonstrated that the drain was not tracking along the expected course of the abscess. Rather the drain was along the anterior aspect of the psoas muscle. The drain was pulled back and tried to reposition with a wire but again drain was not appropriately positioned. As a result, the drain  was completely removed at the end of the procedure. RADIATION DOSE REDUCTION: This exam was performed according to the departmental dose-optimization program which includes automated exposure control, adjustment of the mA and/or kV according to patient size and/or use of iterative reconstruction technique. FINDINGS: Small foci of gas in the right psoas muscle. Needle was directed into the right psoas muscle but no purulent fluid could be aspirated. Small amount of bloody material was collected for culture. Needle was directed into left psoas abscess and bloody purulent fluid was aspirated. Approximately 5 mL of bloody purulent fluid was removed from the left psoas muscle. A drain was placed but the drain appeared to be tracking anterior to the psoas muscle and not adequately positioned to drain the abscess. Therefore this drain was removed. IMPRESSION: 1. CT-guided placement of a drain in the left psoas abscess. 5 mL of bloody purulent fluid was removed. However, the drain was not well positioned and eventually removed at the end of the procedure. 2. CT-guided aspiration of a small amount of bloody fluid from the right psoas muscle. Fluid was sent for culture. Electronically Signed   By: Richarda Overlie M.D.   On: 04/25/2023 18:31   CT ASPIRATION N/S Result Date: 04/25/2023 INDICATION: 68 year old with bilateral groin pain and recent CT imaging demonstrates bilateral psoas fluid collections that are  concerning for infection. EXAM: 1. CT-guided aspiration of right psoas muscle 2. CT-guided placement of drain in left psoas abscess-drain was removed at the end of the procedure MEDICATIONS: Moderate sedation ANESTHESIA/SEDATION: Moderate (conscious) sedation was employed during this procedure. A total of Versed 1mg  and fentanyl 100 mcg was administered intravenously at the order of the provider performing the procedure. Total intra-service moderate sedation time: 40 minutes. Patient's level of consciousness and vital signs were monitored continuously by radiology nurse throughout the procedure under the supervision of the provider performing the procedure. COMPLICATIONS: None immediate. PROCEDURE: Informed written consent was obtained from the patient after a thorough discussion of the procedural risks, benefits and alternatives. All questions were addressed. Maximal Sterile Barrier Technique was utilized including caps, mask, sterile gowns, sterile gloves, sterile drape, hand hygiene and skin antiseptic. A timeout was performed prior to the initiation of the procedure. Patient was placed prone. CT images through the lower abdomen were obtained. The back was prepped and draped in sterile fashion. Skin was anesthetized with 1% lidocaine. Using CT guidance, an 18 gauge trocar needle was directed into the left psoas muscle near the small pockets of air. No purulent fluid could be aspirated. Attempted to advance a wire into the psoas muscle but wire would not advance very far. Needle was repositioned but again no significant purulent fluid could be aspirated. Small drops of bloody fluid were obtained. This fluid was collected for culture. Attention was directed to the left psoas. Skin was anesthetized with 1% lidocaine. Using CT guidance, a new 18 gauge trocar needle was directed into the left psoas muscle and bloody purulent fluid was draining from the needle. Superstiff Amplatz wire was advanced into the left psoas  muscle. Tract was dilated to accommodate a 10 [place] multipurpose drain. Follow up CT images demonstrated that the drain was not tracking along the expected course of the abscess. Rather the drain was along the anterior aspect of the psoas muscle. The drain was pulled back and tried to reposition with a wire but again drain was not appropriately positioned. As a result, the drain was completely removed at the end of the procedure. RADIATION DOSE REDUCTION:  This exam was performed according to the departmental dose-optimization program which includes automated exposure control, adjustment of the mA and/or kV according to patient size and/or use of iterative reconstruction technique. FINDINGS: Small foci of gas in the right psoas muscle. Needle was directed into the right psoas muscle but no purulent fluid could be aspirated. Small amount of bloody material was collected for culture. Needle was directed into left psoas abscess and bloody purulent fluid was aspirated. Approximately 5 mL of bloody purulent fluid was removed from the left psoas muscle. A drain was placed but the drain appeared to be tracking anterior to the psoas muscle and not adequately positioned to drain the abscess. Therefore this drain was removed. IMPRESSION: 1. CT-guided placement of a drain in the left psoas abscess. 5 mL of bloody purulent fluid was removed. However, the drain was not well positioned and eventually removed at the end of the procedure. 2. CT-guided aspiration of a small amount of bloody fluid from the right psoas muscle. Fluid was sent for culture. Electronically Signed   By: Richarda Overlie M.D.   On: 04/25/2023 18:31   MR Lumbar Spine W Wo Contrast Result Date: 04/25/2023 CLINICAL DATA:  Low back pain, infection suspected, positive xray/CT EXAM: MRI LUMBAR SPINE WITHOUT AND WITH CONTRAST TECHNIQUE: Multiplanar and multiecho pulse sequences of the lumbar spine were obtained without and with intravenous contrast. CONTRAST:  5mL  GADAVIST GADOBUTROL 1 MMOL/ML IV SOLN COMPARISON:  MRI 04/19/2023, CT 04/24/2023 FINDINGS: Segmentation:  Standard. Alignment: Trace retrolisthesis of L1 on L2 and L5 on S1. Slight levocurvature. Vertebrae: Small amount of fluid within the L3-L4 disc space with endplate irregularity and increasing bone marrow edema in the L3 and L4 vertebral bodies. Corresponding low T1 bone marrow signal. There is also increasing bone marrow edema and low T1 signal changes associated with the L4-L5 disc space. Small amount of enhancing fluid in the anterior epidural space between the L3 and L4 levels with suspected epidural abscess in the left lateral recess at the level of the L4 vertebral body (series 11, image 23). No fractures. Conus medullaris and cauda equina: Conus extends to the L2 level. Conus and cauda equina appear normal. Paraspinal and other soft tissues: Rim enhancing bilateral psoas muscle abscesses. No posterior paraspinal muscle abscess. Disc levels: T12-L1 through L2-L3: Degenerative disc disease and facet arthropathy, unchanged from prior. L3-L4: Degree of disc height loss appears progressed compared to the recent prior. Increasing disc bulge and endplate spurring. Mild-to-moderate bilateral facet arthropathy. Moderate-severe canal stenosis with moderate bilateral foraminal stenosis. Findings have progressed from prior. L4-L5: Progressed disc height loss with new central disc protrusion. Mild-to-moderate bilateral facet arthropathy. Mild canal stenosis with moderate bilateral foraminal stenosis. Findings have progressed from prior. L5-S1:. Disc bulge and endplate spurring, eccentric to the right. Mild bilateral facet arthropathy. Mild right subarticular recess stenosis without canal stenosis. Severe left and mild-moderate right foraminal stenosis. No appreciable change. IMPRESSION: 1. Findings consistent with discitis-osteomyelitis at L3-L4 and L4-L5. Small amount of enhancing fluid in the anterior epidural space  between the L3 and L4 levels with suspected epidural abscess in the left lateral recess at the level of the L4 vertebral body. 2. Bilateral psoas muscle abscesses. 3. Multilevel lumbar spondylosis, most pronounced at L3-L4 where there is moderate-severe canal stenosis and moderate bilateral foraminal stenosis. 4. Mild canal stenosis at L4-L5 with moderate bilateral foraminal stenosis. 5. Severe left and mild-moderate right foraminal stenosis at L5-S1. Electronically Signed   By: Duanne Guess D.O.  On: 04/25/2023 16:05      Assessment/Plan: MSSA bacteremia  due to cervical spine surgical site infection S/p I/D - culture staph aureues Patient currently on cefazolin and  rifampin Repeat blood culture from 1/26 neg so far TEE will not change the management or duration as she will need 6 weeks of IV antibiotic followed by PO for another 6 weeks     Recent c3-c6 laminectomy and posterior fusion on 03/02/23 MRI showed superficial and deep abscess- underwent I/D and culture is MSSA Continue cefazolin for 6 weeks followed by PO for 6 more weeks Because of hardware added rifampin 300mg  Po BID  Newly diagnosed L4 and L4-L5 discitis and osteomyelitis during the weekend after she had severe groin and leg pain. May change the cefazolin to nafcillin. She has severe nausea and poor appetite in spite of Zofran which likely is due to rifampin so we will discontinue.   Anemia   Hiatus hernia h/o fundoplication Dysphagia has had esophageal dilatation Nov 2024 Biopsy no evidence of metaplasia ? Anxiety  on meds Discussed the management with the patient.

## 2023-04-27 ENCOUNTER — Encounter: Payer: Self-pay | Admitting: Oncology

## 2023-04-27 ENCOUNTER — Other Ambulatory Visit (HOSPITAL_COMMUNITY): Payer: Self-pay

## 2023-04-27 ENCOUNTER — Telehealth (HOSPITAL_COMMUNITY): Payer: Self-pay | Admitting: Pharmacy Technician

## 2023-04-27 DIAGNOSIS — B9561 Methicillin susceptible Staphylococcus aureus infection as the cause of diseases classified elsewhere: Secondary | ICD-10-CM | POA: Diagnosis not present

## 2023-04-27 DIAGNOSIS — M4646 Discitis, unspecified, lumbar region: Secondary | ICD-10-CM | POA: Diagnosis not present

## 2023-04-27 DIAGNOSIS — R7881 Bacteremia: Secondary | ICD-10-CM | POA: Diagnosis not present

## 2023-04-27 DIAGNOSIS — M4622 Osteomyelitis of vertebra, cervical region: Secondary | ICD-10-CM

## 2023-04-27 DIAGNOSIS — M4620 Osteomyelitis of vertebra, site unspecified: Secondary | ICD-10-CM

## 2023-04-27 DIAGNOSIS — T8149XA Infection following a procedure, other surgical site, initial encounter: Secondary | ICD-10-CM | POA: Diagnosis not present

## 2023-04-27 LAB — CBC
HCT: 24.3 % — ABNORMAL LOW (ref 36.0–46.0)
Hemoglobin: 7.7 g/dL — ABNORMAL LOW (ref 12.0–15.0)
MCH: 23.1 pg — ABNORMAL LOW (ref 26.0–34.0)
MCHC: 31.7 g/dL (ref 30.0–36.0)
MCV: 73 fL — ABNORMAL LOW (ref 80.0–100.0)
Platelets: 515 10*3/uL — ABNORMAL HIGH (ref 150–400)
RBC: 3.33 MIL/uL — ABNORMAL LOW (ref 3.87–5.11)
RDW: 23.9 % — ABNORMAL HIGH (ref 11.5–15.5)
WBC: 13.3 10*3/uL — ABNORMAL HIGH (ref 4.0–10.5)
nRBC: 0 % (ref 0.0–0.2)

## 2023-04-27 LAB — HEPATIC FUNCTION PANEL
ALT: 6 U/L (ref 0–44)
AST: 13 U/L — ABNORMAL LOW (ref 15–41)
Albumin: 2.1 g/dL — ABNORMAL LOW (ref 3.5–5.0)
Alkaline Phosphatase: 88 U/L (ref 38–126)
Bilirubin, Direct: <0.1 mg/dL (ref 0.0–0.2)
Total Bilirubin: 0.2 mg/dL (ref 0.0–1.2)
Total Protein: 6 g/dL — ABNORMAL LOW (ref 6.5–8.1)

## 2023-04-27 LAB — HEPARIN LEVEL (UNFRACTIONATED): Heparin Unfractionated: 0.52 [IU]/mL (ref 0.30–0.70)

## 2023-04-27 MED ORDER — APIXABAN 5 MG PO TABS
5.0000 mg | ORAL_TABLET | Freq: Two times a day (BID) | ORAL | Status: DC
Start: 2023-05-04 — End: 2023-04-28

## 2023-04-27 MED ORDER — DICLOFENAC SODIUM 1 % EX GEL: 2.0000 g | Freq: Four times a day (QID) | CUTANEOUS | Status: DC

## 2023-04-27 MED ORDER — APIXABAN 5 MG PO TABS
10.0000 mg | ORAL_TABLET | Freq: Two times a day (BID) | ORAL | Status: DC
  Administered 2023-04-27 – 2023-04-28 (×3): 10 mg via ORAL
  Filled 2023-04-27 (×3): qty 2

## 2023-04-27 NOTE — Treatment Plan (Signed)
 Diagnosis: MSSA bacteremia Cervical spine surgical site infection Lumbar discitis  Baseline Creatinine <2    Allergies  Allergen Reactions   Librium [Chlordiazepoxide] Shortness Of Breath   Reglan [Metoclopramide] Hives and Other (See Comments)    hallucinations    Vanilla Shortness Of Breath    Pt reports allergy to vanilla extract only   Aspirin Hives   Nsaids Rash    Rash/flares asthma issues.   Azithromycin  Other (See Comments)    Unsure of what the reaction was.   Buprenorphine Hcl Other (See Comments)    Unsure of what the reaction was.    Flexeril  [Cyclobenzaprine ]     hallucinations   Morphine Other (See Comments)    Unsure of reaction   Sulfa Antibiotics     Other reaction(s): Unknown   Tolmetin     Other Reaction: Allergy   Amlodipine  Besylate Itching and Rash    arms, stomach and forehead   Iron  Nausea And Vomiting    OPAT Orders Discharge antibiotics: Cefazolin  2 grams IV every 8 hours Duration: 6 weeks End Date: 06/07/23 Will need Po antibiotic after that- cefadroxil    PIC Care Per Protocol:  Labs weekly while on IV antibiotics: _X_ CBC with differential  _X_ CMP _X_ CRP _X_ ESR   _X_ Please pull PIC at completion of IV antibiotics   Fax weekly lab results  promptly to [phone]  Clinic Follow Up Appt:with Dr.Vantasia Pinkney on 05/13/23 at 9.30 Am   Call [phone] with critical value or questions

## 2023-04-27 NOTE — TOC Progression Note (Signed)
 Transition of Care [facility]) - Progression Note    Patient Details  Name: Summer Murphy MRN: [id number] Date of Birth: 02/08/1956  Transition of [facility]) CM/SW Contact  Ladene Lady, LCSW Phone Number: 04/27/2023, 10:30 AM  Clinical Narrative:     Shara restarted for peak.       Barriers to Discharge: Continued Medical Work up (Intractable pain episode, provider consulted, may need IR.)  Expected Discharge Plan and Services                                               Social Determinants of Health (SDOH) Interventions SDOH Screenings   Food Insecurity: No Food Insecurity (04/18/2023)  Housing: Low Risk  (04/18/2023)  Transportation Needs: No Transportation Needs (04/18/2023)  Utilities: Not At Risk (04/16/2023)  Alcohol Screen: Low Risk  (12/31/2021)  Depression (PHQ2-9): Low Risk  (01/27/2023)  Social Connections: Moderately Isolated (04/16/2023)  Tobacco Use: Low Risk  (04/16/2023)    Readmission Risk Interventions     No data to display

## 2023-04-27 NOTE — Progress Notes (Addendum)
 PHARMACY - ANTICOAGULATION CONSULT NOTE  Pharmacy Consult for Heparin  Infusion to Apixaban   Indication:  VTE Treatment, nonocclusive IVC thrombus  Allergies  Allergen Reactions   Librium [Chlordiazepoxide] Shortness Of Breath   Reglan [Metoclopramide] Hives and Other (See Comments)    hallucinations    Vanilla Shortness Of Breath    Pt reports allergy to vanilla extract only   Aspirin Hives   Nsaids Rash    Rash/flares asthma issues.   Azithromycin  Other (See Comments)    Unsure of what the reaction was.   Buprenorphine Hcl Other (See Comments)    Unsure of what the reaction was.    Flexeril  [Cyclobenzaprine ]     hallucinations   Morphine Other (See Comments)    Unsure of reaction   Sulfa Antibiotics     Other reaction(s): Unknown   Tolmetin     Other Reaction: Allergy   Amlodipine  Besylate Itching and Rash    arms, stomach and forehead   Iron  Nausea And Vomiting   Patient Measurements: Height: 5' 2.99 (160 cm) Weight: 56.8 kg (125 lb 3.5 oz) IBW/kg (Calculated) : 52.38 Heparin  Dosing Weight: 56.8 kg  Vital Signs: Temp: 98.9 F (37.2 C) (02/03 1958) BP: 144/86 (02/03 1958) Pulse Rate: 100 (02/03 1958)  Labs: Recent Labs    04/24/23 1534 04/24/23 2213 04/25/23 0508 04/26/23 0033 04/26/23 0630 04/26/23 1448 04/26/23 2048 04/27/23 0618  HGB  --    < > 7.9*  --  8.0*  --   --  7.7*  HCT  --   --  25.3*  --  25.6*  --   --  24.3*  PLT  --   --  448*  --  491*  --   --  515*  APTT 49*  --   --   --   --   --   --   --   LABPROT 17.5*  --  17.7*  --   --   --   --   --   INR 1.4*  --  1.4*  --   --   --   --   --   HEPARINUNFRC  --    < > 0.10*   < > 0.17* 0.37 0.27* 0.52  CREATININE  --   --  <0.30*  --  0.38*  --   --   --    < > = values in this interval not displayed.   Estimated Creatinine Clearance: 56.4 mL/min (A) (by C-G formula based on SCr of 0.38 mg/dL (L)).  Medical History: Past Medical History:  Diagnosis Date   Acid reflux    Anemia     Anxiety    a.) buspirone  BID + BZO PRN (clonazepam )   Arthritis    Asthma    Cervical disc disorder with radiculopathy of cervical region    Chronic pain syndrome    a.) on COT as prescribed by pain mangement   Chronic, continuous use of opioids    a.) followed by pain mangement   COPD (chronic obstructive pulmonary disease) (HCC)    DOE (dyspnea on exertion)    Hematuria    Hiatal hernia    History of kidney stones    Hypertension    Hypoglycemia    Insomnia    a.) uses Trazodone  PRN   LBBB (left bundle branch block)    Migraine    Murmur    Osteopenia    Palpitations    Pre-diabetes    Restless leg  a.) on ropinirole    Stenosis of cervical spine with myelopathy (HCC)    T2DM (type 2 diabetes mellitus) (HCC)    Medications:  Previously on enoxaparin  40 mg SQ q24h for DVT ppx- last dose 1/31 @ 2043  Assessment: Patient is a 68 year old female currently being treated for MSSA bacteremia and abscess/discitis at cervical fusion site. She is s/p drainage and was started on cefazolin  and rifampin . CT abdomen/pelvis today showed nonocclusive thrombus in IVC. After discussion with vascular, MD decided to start heparin  infusion and continue antibiotics in case it is an infected thrombus. MD wants heparin  to continue post procedure 2/1. Pharmacy was initially consulted to initiate patient on a heparin  infusion and is now consulted to transition patient to apixaban .  No signs/symptoms of bleeding noted. Hgb 8>7.7. PLT 491>515.  Goal of Therapy:  Monitor platelets by anticoagulation protocol: Yes   Plan: Discontinue heparin  infusion Start apixaban  10 mg BID x 7 days, followed by apixaban  5 mg BID Monitor CBC and SCr weekly unless more frequent monitoring is needed   Lum Mania, PharmD Clinical Pharmacist  04/27/2023 7:37 AM

## 2023-04-27 NOTE — Progress Notes (Addendum)
 PROGRESS NOTE SHAY JHAVERI  FMW:[id number]  DOB: [birth year]  DOA: 04/16/2023 PCP: Liana Fish, NP  Hospital course: 68 year old female with DM2, recent cervical fusion last month and chronic hip arthritis was admitted 04/16/2023 with sepsis after fall.  Workup revealed MSSA bacteremia and abscess/discitis at cervical fusion site.  Patient underwent drainage and was placed on cefazolin  and rifampin .  Patient subsequently had lower back and bilateral leg pain however MRI showed only multilevel DJD although it was motion degraded.  Patient is presently awaiting SNF placement, PICC line is in place for 6 weeks of IV Kefzol .  1/27: Repeat blood cultures with no growth in 24-hour, neurosurgery obtained lumbar MRI as she was complaining of lower back and bilateral leg pain, it was motion degraded study but did show multilevel degenerative changes and neural foraminal narrowing. Cervical wound culture with some Staph aureus.  TTE with EF of 45 to 50%, some concern of regional wall motion abnormalities likely due to LBBB, discussed with cardiology and they are recommending outpatient follow-up.  Appointment was set with Dr. Gollan in February.   1/28: Hemodynamically stable, continue to have lower back and leg pain.  Cervical drain remain in place.  ID is recommending at least 6 weeks of IV cefazolin  followed by another 4 to 6 weeks of p.o. antibiotics.  PICC line was placed today.  Repeat blood cultures negative for 48 hours. 1/29: drain removed.  1/30-1/31: patient doing well. PT updated recs to SNF at dc. TOC informed. She is cleared to dc when bed available. Continue pain control, IV abx course as recommended by ID and mobilize patient as much as possible. Staple removal by neurosurgery 14 days post-op. Healing well.  2/1: Patient with severe left groin pain.  Workup reveals bilateral psoas abscesses.  Also noted to have incomplete IVC clot.  Started on heparin . 2/2: Patient underwent CT  drainage of bilateral psoas abscesses. 2/3-2/4: doing well. Awaiting final abscess culture results. Repeat blood cultures NGTD. Awaiting SNF placement within next couple days  Subjective: Continues to have right groin pain which she states improves after moving more. She is relatively comfortable. Denies SOB, CP, neck pain.  Objective: Blood pressure 127/73, pulse (!) 104, temperature 98.1 F (36.7 C), resp. rate 19, height 5' 2.99 (1.6 m), weight 56.8 kg, SpO2 97%.  Exam:  General: Patient sitting in bed Eyes: sclera anicteric, conjuctiva mild injection bilaterally CVS: S1-S2, regular  Respiratory:  normal effort. CTAB GI: no abdominal tenderness or bloating.  MSK, LE: No edema LE. Tenderness to palpation R groin. No drainage on aspiration site posteriorly.  Neuro: A/O x 3,  grossly nonfocal.   Data Reviewed:  Basic Metabolic Panel: Recent Labs  Lab 04/21/23 0506 04/23/23 0545 04/24/23 0454 04/25/23 0508 04/26/23 0630  NA 138 139 135 137 137  K 3.2* 3.6 3.5 3.2* 3.6  CL 98 97* 94* 99 97*  CO2 30 31 29 27 29   GLUCOSE 92 95 96 90 105*  BUN 12 14 12  7* 6*  CREATININE 0.41* 0.44 0.44 <0.30* 0.38*  CALCIUM 8.0* 8.0* 8.2* 7.9* 8.1*  MG 1.6*  --   --   --   --     CBC: Recent Labs  Lab 04/23/23 0545 04/24/23 0454 04/25/23 0508 04/26/23 0630 04/27/23 0618  WBC 14.9* 14.4* 13.9* 13.3* 13.3*  NEUTROABS  --   --  11.3*  --   --   HGB 8.4* 8.6* 7.9* 8.0* 7.7*  HCT 26.2* 27.0* 25.3* 25.6* 24.3*  MCV 73.0*  71.4* 74.4* 73.4* 73.0*  PLT 489* 515* 448* 491* 515*     Scheduled Meds:  acetaminophen   650 mg Oral Q4H   busPIRone   10 mg Oral BID   celecoxib   100 mg Oral BID   Chlorhexidine  Gluconate Cloth  6 each Topical Daily   diclofenac  Sodium  2 g Topical QID   DULoxetine   60 mg Oral Daily   Fe Fum-Vit C-Vit B12-FA  1 capsule Oral BID   fluticasone  furoate-vilanterol  1 puff Inhalation Daily   gabapentin   900 mg Oral BID   methocarbamol   750 mg Oral TID    montelukast   10 mg Oral QHS   pantoprazole   20 mg Oral Daily   rOPINIRole   1 mg Oral QHS   sodium chloride  flush  10-40 mL Intracatheter Q12H   sodium chloride  flush  3 mL Intravenous Q12H   traZODone   100 mg Oral QHS   Continuous Infusions:   ceFAZolin  (ANCEF ) IV 2 g (04/27/23 0557)   heparin  1,400 Units/hr (04/27/23 0013)   Assessment & Plan:   Cervical abscess at C3-3 6, s/p laminectomy and posterior fusion 03/02/2023.  Status post I&D of superficial and deep abscesses. Neurosurgery removed drain 1/29. Culture positive for MSSA Seen by ID, continue cefazolin  for 6 weeks followed by p.o. for 6 more weeks Rifampin  300 mg p.o. twice daily is added for hardware but discontinued due to side effects.  Staple removal by neurosurgery sometime around 2/8  Bilateral psoas abscesses seen on CT- fluid collections in bilateral psoas: 3 x 6 cm on the right and a 3 x 8 cm on the left Underwent I&D 2/2. Minimal bloody fluid aspirated from right and small amount of purulent fluid aspirated on left.  - continue cefazolin  Aspirate was sent for culture and is pending Will likely need follow up imaging to see if the collections change, per IR - analgesia PRN - PT/OT. Evaluation recommends SNF.   Discitis-osteomyelitis L3-4- management as directed by ID for additional conditions above  Nonocclusive thrombus in IVC seen on CT 2/2 Chronicity is unclear Concern for possible infected thrombus given MSSA bacteremia Discussed  with vascular, at present they recommend treatment with heparin  and prolonged antibiotics If blood cultures continue positive, they can be consulted Repeat blood cultures NGTD making infectious thrombus less likely.  - transitioning from heparin  to eliquis   Anemia of chronic disease- hgb is stable around 8. No signs of active bleeding. Heparin  gtt has stopped today and starting eliquis . Will need regular hgb monitoring - iron  supplement  Patchy groundglass opacities seen on  CT Involvement of RML and both lower lobes with bronchiectasis in the lingula, likely sequela of multifocal infection/inflammation At present patient is on room air, no signs of acute lung infection  DM 2- diet controlled  Hyponatremia Hypomagnesemia Hypokalemia Resolved with treatment  Falls Imaging workup is negative for acute fracture PT and OT following, dc to SNF  Asthma No evidence of acute flare, continue home inhalers  Anxiety and depression Continue Cymbalta  and BuSpar  Continue as needed clonazepam   GERD Continue PPI   DVT prophylaxis: Lovenox  Code Status: Full Family Communication: none at bedside  Studies: CT GUIDED SOFT TISSUE FLUID DRAIN BY PERC CATH Result Date: 04/25/2023 INDICATION: 68 year old with bilateral groin pain and recent CT imaging demonstrates bilateral psoas fluid collections that are concerning for infection. EXAM: 1. CT-guided aspiration of right psoas muscle 2. CT-guided placement of drain in left psoas abscess-drain was removed at the end of the procedure MEDICATIONS: Moderate sedation  ANESTHESIA/SEDATION: Moderate (conscious) sedation was employed during this procedure. A total of Versed  1mg  and fentanyl  100 mcg was administered intravenously at the order of the provider performing the procedure. Total intra-service moderate sedation time: 40 minutes. Patient's level of consciousness and vital signs were monitored continuously by radiology nurse throughout the procedure under the supervision of the provider performing the procedure. COMPLICATIONS: None immediate. PROCEDURE: Informed written consent was obtained from the patient after a thorough discussion of the procedural risks, benefits and alternatives. All questions were addressed. Maximal Sterile Barrier Technique was utilized including caps, mask, sterile gowns, sterile gloves, sterile drape, hand hygiene and skin antiseptic. A timeout was performed prior to the initiation of the procedure.  Patient was placed prone. CT images through the lower abdomen were obtained. The back was prepped and draped in sterile fashion. Skin was anesthetized with 1% lidocaine . Using CT guidance, an 18 gauge trocar needle was directed into the left psoas muscle near the small pockets of air. No purulent fluid could be aspirated. Attempted to advance a wire into the psoas muscle but wire would not advance very far. Needle was repositioned but again no significant purulent fluid could be aspirated. Small drops of bloody fluid were obtained. This fluid was collected for culture. Attention was directed to the left psoas. Skin was anesthetized with 1% lidocaine . Using CT guidance, a new 18 gauge trocar needle was directed into the left psoas muscle and bloody purulent fluid was draining from the needle. Superstiff Amplatz wire was advanced into the left psoas muscle. Tract was dilated to accommodate a 10 French multipurpose drain. Follow up CT images demonstrated that the drain was not tracking along the expected course of the abscess. Rather the drain was along the anterior aspect of the psoas muscle. The drain was pulled back and tried to reposition with a wire but again drain was not appropriately positioned. As a result, the drain was completely removed at the end of the procedure. RADIATION DOSE REDUCTION: This exam was performed according to the departmental dose-optimization program which includes automated exposure control, adjustment of the mA and/or kV according to patient size and/or use of iterative reconstruction technique. FINDINGS: Small foci of gas in the right psoas muscle. Needle was directed into the right psoas muscle but no purulent fluid could be aspirated. Small amount of bloody material was collected for culture. Needle was directed into left psoas abscess and bloody purulent fluid was aspirated. Approximately 5 mL of bloody purulent fluid was removed from the left psoas muscle. A drain was placed but  the drain appeared to be tracking anterior to the psoas muscle and not adequately positioned to drain the abscess. Therefore this drain was removed. IMPRESSION: 1. CT-guided placement of a drain in the left psoas abscess. 5 mL of bloody purulent fluid was removed. However, the drain was not well positioned and eventually removed at the end of the procedure. 2. CT-guided aspiration of a small amount of bloody fluid from the right psoas muscle. Fluid was sent for culture. Electronically Signed   By: Juliene Balder M.D.   On: 04/25/2023 18:31   CT ASPIRATION N/S Result Date: 04/25/2023 INDICATION: 68 year old with bilateral groin pain and recent CT imaging demonstrates bilateral psoas fluid collections that are concerning for infection. EXAM: 1. CT-guided aspiration of right psoas muscle 2. CT-guided placement of drain in left psoas abscess-drain was removed at the end of the procedure MEDICATIONS: Moderate sedation ANESTHESIA/SEDATION: Moderate (conscious) sedation was employed during this procedure. A total of  Versed  1mg  and fentanyl  100 mcg was administered intravenously at the order of the provider performing the procedure. Total intra-service moderate sedation time: 40 minutes. Patient's level of consciousness and vital signs were monitored continuously by radiology nurse throughout the procedure under the supervision of the provider performing the procedure. COMPLICATIONS: None immediate. PROCEDURE: Informed written consent was obtained from the patient after a thorough discussion of the procedural risks, benefits and alternatives. All questions were addressed. Maximal Sterile Barrier Technique was utilized including caps, mask, sterile gowns, sterile gloves, sterile drape, hand hygiene and skin antiseptic. A timeout was performed prior to the initiation of the procedure. Patient was placed prone. CT images through the lower abdomen were obtained. The back was prepped and draped in sterile fashion. Skin was  anesthetized with 1% lidocaine . Using CT guidance, an 18 gauge trocar needle was directed into the left psoas muscle near the small pockets of air. No purulent fluid could be aspirated. Attempted to advance a wire into the psoas muscle but wire would not advance very far. Needle was repositioned but again no significant purulent fluid could be aspirated. Small drops of bloody fluid were obtained. This fluid was collected for culture. Attention was directed to the left psoas. Skin was anesthetized with 1% lidocaine . Using CT guidance, a new 18 gauge trocar needle was directed into the left psoas muscle and bloody purulent fluid was draining from the needle. Superstiff Amplatz wire was advanced into the left psoas muscle. Tract was dilated to accommodate a 10 French multipurpose drain. Follow up CT images demonstrated that the drain was not tracking along the expected course of the abscess. Rather the drain was along the anterior aspect of the psoas muscle. The drain was pulled back and tried to reposition with a wire but again drain was not appropriately positioned. As a result, the drain was completely removed at the end of the procedure. RADIATION DOSE REDUCTION: This exam was performed according to the departmental dose-optimization program which includes automated exposure control, adjustment of the mA and/or kV according to patient size and/or use of iterative reconstruction technique. FINDINGS: Small foci of gas in the right psoas muscle. Needle was directed into the right psoas muscle but no purulent fluid could be aspirated. Small amount of bloody material was collected for culture. Needle was directed into left psoas abscess and bloody purulent fluid was aspirated. Approximately 5 mL of bloody purulent fluid was removed from the left psoas muscle. A drain was placed but the drain appeared to be tracking anterior to the psoas muscle and not adequately positioned to drain the abscess. Therefore this drain was  removed. IMPRESSION: 1. CT-guided placement of a drain in the left psoas abscess. 5 mL of bloody purulent fluid was removed. However, the drain was not well positioned and eventually removed at the end of the procedure. 2. CT-guided aspiration of a small amount of bloody fluid from the right psoas muscle. Fluid was sent for culture. Electronically Signed   By: Juliene Balder M.D.   On: 04/25/2023 18:31   MR Lumbar Spine W Wo Contrast Result Date: 04/25/2023 CLINICAL DATA:  Low back pain, infection suspected, positive xray/CT EXAM: MRI LUMBAR SPINE WITHOUT AND WITH CONTRAST TECHNIQUE: Multiplanar and multiecho pulse sequences of the lumbar spine were obtained without and with intravenous contrast. CONTRAST:  5mL GADAVIST  GADOBUTROL  1 MMOL/ML IV SOLN COMPARISON:  MRI 04/19/2023, CT 04/24/2023 FINDINGS: Segmentation:  Standard. Alignment: Trace retrolisthesis of L1 on L2 and L5 on S1. Slight levocurvature. Vertebrae: Small  amount of fluid within the L3-L4 disc space with endplate irregularity and increasing bone marrow edema in the L3 and L4 vertebral bodies. Corresponding low T1 bone marrow signal. There is also increasing bone marrow edema and low T1 signal changes associated with the L4-L5 disc space. Small amount of enhancing fluid in the anterior epidural space between the L3 and L4 levels with suspected epidural abscess in the left lateral recess at the level of the L4 vertebral body (series 11, image 23). No fractures. Conus medullaris and cauda equina: Conus extends to the L2 level. Conus and cauda equina appear normal. Paraspinal and other soft tissues: Rim enhancing bilateral psoas muscle abscesses. No posterior paraspinal muscle abscess. Disc levels: T12-L1 through L2-L3: Degenerative disc disease and facet arthropathy, unchanged from prior. L3-L4: Degree of disc height loss appears progressed compared to the recent prior. Increasing disc bulge and endplate spurring. Mild-to-moderate bilateral facet  arthropathy. Moderate-severe canal stenosis with moderate bilateral foraminal stenosis. Findings have progressed from prior. L4-L5: Progressed disc height loss with new central disc protrusion. Mild-to-moderate bilateral facet arthropathy. Mild canal stenosis with moderate bilateral foraminal stenosis. Findings have progressed from prior. L5-S1:. Disc bulge and endplate spurring, eccentric to the right. Mild bilateral facet arthropathy. Mild right subarticular recess stenosis without canal stenosis. Severe left and mild-moderate right foraminal stenosis. No appreciable change. IMPRESSION: 1. Findings consistent with discitis-osteomyelitis at L3-L4 and L4-L5. Small amount of enhancing fluid in the anterior epidural space between the L3 and L4 levels with suspected epidural abscess in the left lateral recess at the level of the L4 vertebral body. 2. Bilateral psoas muscle abscesses. 3. Multilevel lumbar spondylosis, most pronounced at L3-L4 where there is moderate-severe canal stenosis and moderate bilateral foraminal stenosis. 4. Mild canal stenosis at L4-L5 with moderate bilateral foraminal stenosis. 5. Severe left and mild-moderate right foraminal stenosis at L5-S1. Electronically Signed   By: Mabel Converse D.O.   On: 04/25/2023 16:05    Principal Problem:   Sepsis (HCC) Active Problems:   T2DM (type 2 diabetes mellitus) (HCC)   Fever   Falls   Chronic pain syndrome   Asthma   Restless leg   Migraine   Anxiety and depression   Acid reflux   Electrolyte abnormality   Microcytic anemia   MSSA bacteremia   Surgical site infection   Vertebral osteomyelitis (HCC)   Lumbar discitis   Infection of cervical spine (HCC)     Wasyl Dornfeld LITTIE Piety, [facility]  If 7PM-7AM, please contact night-coverage [email or web address]   LOS: 10 days

## 2023-04-27 NOTE — TOC Progression Note (Signed)
 Transition of Care [facility]) - Progression Note    Patient Details  Name: Summer Murphy MRN: [id number] Date of Birth: 05/24/1955  Transition of [facility]) CM/SW Contact  Ladene Lady, LCSW Phone Number: 04/27/2023, 4:30 PM  Clinical Narrative:   Shara approved for [facility]. Tammy reports she will take pt tomorrow.       Barriers to Discharge: Continued Medical Work up (Intractable pain episode, provider consulted, may need IR.)  Expected Discharge Plan and Services                                               Social Determinants of Health (SDOH) Interventions SDOH Screenings   Food Insecurity: No Food Insecurity (04/18/2023)  Housing: Low Risk  (04/18/2023)  Transportation Needs: No Transportation Needs (04/18/2023)  Utilities: Not At Risk (04/16/2023)  Alcohol Screen: Low Risk  (12/31/2021)  Depression (PHQ2-9): Low Risk  (01/27/2023)  Social Connections: Moderately Isolated (04/16/2023)  Tobacco Use: Low Risk  (04/16/2023)    Readmission Risk Interventions     No data to display

## 2023-04-27 NOTE — Telephone Encounter (Signed)
 Patient Product/process development scientist completed.    The patient is insured through [facility]. Patient has Medicare and is not eligible for a copay card, but may be able to apply for patient assistance or Medicare RX Payment Plan (Patient Must reach out to their plan, if eligible for payment plan), if available.    Ran test claim for Eliquis Starter pack and the current 30 day co-pay is $0.00.   This test claim was processed through [facility]- copay amounts may vary at other pharmacies due to pharmacy/plan contracts, or as the patient moves through the different stages of their insurance plan.     Roland Earl, CPHT Pharmacy Technician III Certified Patient Advocate [facility] Patient Advocate Team Direct Number: [phone]  Fax: [phone]

## 2023-04-27 NOTE — Progress Notes (Signed)
 Date of Admission:  04/16/2023     ID: Summer Murphy is a 68 y.o. female  Principal Problem:   Sepsis (HCC) Active Problems:   Migraine   Acid reflux   Restless leg   Microcytic anemia   Asthma   Anxiety and depression   T2DM (type 2 diabetes mellitus) (HCC)   Fever   Chronic pain syndrome   Falls   Electrolyte abnormality   MSSA bacteremia   Surgical site infection   Vertebral osteomyelitis (HCC)   Lumbar discitis   Infection of cervical spine (HCC)  Summer Murphy is a 68 y.o. with a history of cervical myelpathy for which she  underwent c3-c6 laminectomy and posterior fusion on 03/02/23, COPD, barrettts esophagus, hiatus hernia, HTN, followed by neurosurgery as OP and last seen by them on 04/12/23 with findings of overall wound healing well presented to the ED thru EMS from home on 04/16/23 after fall at home- as per the chart she was trying to reach for ehr walker and slipped and fell and could not get off the floor- 2 days prior to that she had slipped from her chair- she did not c/o fever or chills She had neck pain which was present since before surgery and not changed in intensity recently Over the weekend patient was having severe pain in the right groin and underwent CT scan and then MRI which showed Lumbar spine which showed discitis osteomyelitis at L3 and L4 and L4 and L5.  There was a small amount of enhancing fluid in the anterior epidural space with suspected epidural abscess.  Bilateral psoas muscle abscesses were seen. Patient underwent aspiration of the psoas abscess by IR on 04/25/2023 and culture has been sent.   Subjective: Patient is doing much better today Sitting in the chair Pain groin and legs much better Nausea has improved   acetaminophen   650 mg Oral Q4H   apixaban   10 mg Oral BID   Followed by   NOREEN ON 05/04/2023] apixaban   5 mg Oral BID   busPIRone   10 mg Oral BID   celecoxib   100 mg Oral BID   Chlorhexidine  Gluconate Cloth  6 each  Topical Daily   diclofenac  Sodium  2 g Topical QID   DULoxetine   60 mg Oral Daily   Fe Fum-Vit C-Vit B12-FA  1 capsule Oral BID   fluticasone  furoate-vilanterol  1 puff Inhalation Daily   gabapentin   900 mg Oral BID   methocarbamol   750 mg Oral TID   montelukast   10 mg Oral QHS   pantoprazole   20 mg Oral Daily   rOPINIRole   1 mg Oral QHS   sodium chloride  flush  3 mL Intravenous Q12H   traZODone   100 mg Oral QHS    Objective: Vital signs in last 24 hours: Patient Vitals for the past 24 hrs:  BP Temp Pulse Resp SpO2  04/27/23 0745 (!) 148/80 98.6 F (37 C) 91 19 98 %  04/26/23 1958 (!) 144/86 98.9 F (37.2 C) 100 -- 97 %  04/26/23 1609 (!) 151/71 98.1 F (36.7 C) 94 19 99 %      PHYSICAL EXAM:  General: Alert, cooperative,  Neck: cervical area- surgical site open, staples present - drain removed Lungs: b/l air entry Heart: Regular rate and rhythm, no murmur, rub or gallop. Abdomen: Soft, non-tender,not distended. Bowel sounds normal. No masses Extremities: b/l knee scars Skin: No rashes or lesions. Or bruising Lymph: Cervical, supraclavicular normal. Neurologic: Grossly non-focal  Lab  Results    Latest Ref Rng & Units 04/27/2023    6:18 AM 04/26/2023    6:30 AM 04/25/2023    5:08 AM  CBC  WBC 4.0 - 10.5 K/uL 13.3  13.3  13.9   Hemoglobin 12.0 - 15.0 g/dL 7.7  8.0  7.9   Hematocrit 36.0 - 46.0 % 24.3  25.6  25.3   Platelets 150 - 400 K/uL 515  491  448        Latest Ref Rng & Units 04/27/2023    6:18 AM 04/26/2023    6:30 AM 04/25/2023    5:08 AM  CMP  Glucose 70 - 99 mg/dL  894  90   BUN 8 - 23 mg/dL  6  7   Creatinine 9.55 - 1.00 mg/dL  9.61  <9.69   Sodium 864 - 145 mmol/L  137  137   Potassium 3.5 - 5.1 mmol/L  3.6  3.2   Chloride 98 - 111 mmol/L  97  99   CO2 22 - 32 mmol/L  29  27   Calcium 8.9 - 10.3 mg/dL  8.1  7.9   Total Protein 6.5 - 8.1 g/dL 6.0     Total Bilirubin 0.0 - 1.2 mg/dL 0.2     Alkaline Phos 38 - 126 U/L 88     AST 15 - 41 U/L 13     ALT 0  - 44 U/L 6         Microbiology: 04/16/23 BC MSSA both sets 04/17/23 surgical wound culture MSSA 04/18/23 BC- NG 04/25/2023.  Blood culture no growth 04/25/23 Culture from the psoas abscess so far is no growth.     Assessment/Plan: MSSA bacteremia  due to cervical spine surgical site infection S/p I/D - culture staph aureues Patient currently on cefazolin  and  rifampin  Repeat blood culture from 1/26  and 2/2 neg so far TEE will not change the management or duration as she will need 6 weeks of IV antibiotic followed by PO for another 6 weeks     Recent c3-c6 laminectomy and posterior fusion on 03/02/23 MRI showed superficial and deep abscess- underwent I/D and culture is MSSA Continue cefazolin  for 6 weeks followed by PO Keflex  or cefadroxil  or dicloxacillin for 6 more weeks Because of hardware added rifampin  300mg  Po BID but she is not able to to tolerate the rifampin  as she has severe nausea and poor appetite.  Hence it was discontinued.  Newly diagnosed L4 and L4-L5 discitis and osteomyelitis during the weekend after she had severe groin and leg pain. As per patient of the fluid from the psoas abscess and the disc spaces so far no growth. Patient will  6 weeks from 05/13/2023 will adjust OPAT orders   Anemia   Hiatus hernia h/o fundoplication Dysphagia has had esophageal dilatation Nov 2024 Biopsy no evidence of metaplasia ? Anxiety  on meds Discussed the management with the patient and care team.          OPAT Orders Discharge antibiotics: Cefazolin  2 grams IV every 8 hours Duration: 6 weeks End Date: 06/06/23 After that will give cefadroxil  PO     PIC Care Per Protocol:   Labs weekly while on IV antibiotics: _X_ CBC with differential   _X_ CMP _X_ CRP _X_ ESR     _X_ Please pull PIC at completion of IV antibiotics     Fax weekly lab results  promptly to [phone]   Clinic Follow Up Appt: 05/13/23 at 9.30  am with Dr.Natassia Guthridge    Call [phone]  with any critical value or questions

## 2023-04-28 ENCOUNTER — Telehealth: Payer: Self-pay

## 2023-04-28 DIAGNOSIS — M4620 Osteomyelitis of vertebra, site unspecified: Secondary | ICD-10-CM | POA: Diagnosis not present

## 2023-04-28 DIAGNOSIS — A419 Sepsis, unspecified organism: Secondary | ICD-10-CM

## 2023-04-28 DIAGNOSIS — W19XXXA Unspecified fall, initial encounter: Secondary | ICD-10-CM | POA: Diagnosis not present

## 2023-04-28 DIAGNOSIS — M4802 Spinal stenosis, cervical region: Secondary | ICD-10-CM

## 2023-04-28 DIAGNOSIS — G992 Myelopathy in diseases classified elsewhere: Secondary | ICD-10-CM

## 2023-04-28 DIAGNOSIS — M4622 Osteomyelitis of vertebra, cervical region: Secondary | ICD-10-CM | POA: Diagnosis not present

## 2023-04-28 DIAGNOSIS — Z981 Arthrodesis status: Secondary | ICD-10-CM

## 2023-04-28 LAB — CBC
HCT: 24.2 % — ABNORMAL LOW (ref 36.0–46.0)
Hemoglobin: 7.7 g/dL — ABNORMAL LOW (ref 12.0–15.0)
MCH: 23.1 pg — ABNORMAL LOW (ref 26.0–34.0)
MCHC: 31.8 g/dL (ref 30.0–36.0)
MCV: 72.5 fL — ABNORMAL LOW (ref 80.0–100.0)
Platelets: 520 10*3/uL — ABNORMAL HIGH (ref 150–400)
RBC: 3.34 MIL/uL — ABNORMAL LOW (ref 3.87–5.11)
RDW: 23.9 % — ABNORMAL HIGH (ref 11.5–15.5)
WBC: 11.9 10*3/uL — ABNORMAL HIGH (ref 4.0–10.5)
nRBC: 0 % (ref 0.0–0.2)

## 2023-04-28 MED ORDER — POLYETHYLENE GLYCOL 3350 17 G PO PACK: 17.0000 g | PACK | Freq: Two times a day (BID) | ORAL | Status: AC

## 2023-04-28 MED ORDER — ROPINIROLE HCL 1 MG PO TABS: 1.0000 mg | ORAL_TABLET | Freq: Every day | ORAL | Status: DC

## 2023-04-28 MED ORDER — CEFAZOLIN IV (FOR PTA / DISCHARGE USE ONLY): 2.0000 g | Freq: Three times a day (TID) | INTRAVENOUS | 0 refills | Status: AC

## 2023-04-28 MED ORDER — IPRATROPIUM-ALBUTEROL 0.5-2.5 (3) MG/3ML IN SOLN: 3.0000 mL | Freq: Four times a day (QID) | RESPIRATORY_TRACT | Status: AC | PRN

## 2023-04-28 MED ORDER — METHOCARBAMOL 750 MG PO TABS: 750.0000 mg | ORAL_TABLET | Freq: Three times a day (TID) | ORAL | Status: DC | PRN

## 2023-04-28 MED ORDER — FE FUM-VIT C-VIT B12-FA 460-60-0.01-1 MG PO CAPS: 1.0000 | ORAL_CAPSULE | Freq: Two times a day (BID) | ORAL | Status: DC

## 2023-04-28 MED ORDER — APIXABAN 5 MG PO TABS: ORAL_TABLET | ORAL | Status: DC

## 2023-04-28 MED ORDER — HYDROCODONE-ACETAMINOPHEN 5-325 MG PO TABS: 1.0000 | ORAL_TABLET | ORAL | 0 refills | Status: AC | PRN

## 2023-04-28 MED ORDER — POLYETHYLENE GLYCOL 3350 17 G PO PACK
17.0000 g | PACK | Freq: Two times a day (BID) | ORAL | Status: DC
  Administered 2023-04-28: 17 g via ORAL
  Filled 2023-04-28: qty 1

## 2023-04-28 NOTE — TOC Transition Note (Signed)
 Transition of Care [facility]) - Discharge Note   Patient Details  Name: KIMA MALENFANT MRN: [id number] Date of Birth: 26-Jan-1956  [facility] CM/SW Contact:  Ladene Lady, LCSW Phone Number: 04/28/2023, 9:45 AM   Clinical Narrative:   Pt [facility] to [facility] to [facility] for [facility] and [facility] antibiotics. CS[facility] has sent [facility] summary to [facility]. RN given number for report. Medical neccesity printed to unit.    Final next level of care: Skilled Nursing Facility Barriers to Discharge: Continued Medical Work up   Patient Goals and CMS Choice Patient states their goals for this hospitalization and ongoing recovery are:: go to [facility] for rehab and anitbiotics CMS Medicare.gov Compare Post Acute Care list provided to:: Patient Choice offered to / list presented to : Patient      Discharge Placement PASRR number recieved: 04/28/23            Patient chooses bed at: [facility] [place] Patient to be transferred to facility by: [facility]   Patient and family notified of of transfer: 04/28/23  Discharge Plan and Services Additional resources added to the After Visit Summary for                                       Social Drivers of Health (SDOH) Interventions SDOH Screenings   Food Insecurity: No Food Insecurity (04/18/2023)  Housing: Low Risk  (04/18/2023)  Transportation Needs: No Transportation Needs (04/18/2023)  Utilities: Not At Risk (04/16/2023)  Alcohol Screen: Low Risk  (12/31/2021)  Depression (PHQ2-9): Low Risk  (01/27/2023)  Social Connections: Moderately Isolated (04/16/2023)  Tobacco Use: Low Risk  (04/16/2023)     Readmission Risk Interventions     No data to display

## 2023-04-28 NOTE — Telephone Encounter (Signed)
-----   Message from Edsel Jama Goods sent at 04/28/2023 12:58 PM EST ----- Regarding: RE: Correct. Although she was dced to SNF I think so if they're are comfortable getting the staples out that would be ok too. ----- Message ----- From: Jaquavian Firkus, RN Sent: 04/28/2023  12:46 PM EST To: Edsel Jama Goods, PA  Looks like she was discharged today. Keep appt on 2/7 w/ Brooke b/c it looks like she still has staples, right?

## 2023-04-28 NOTE — Telephone Encounter (Signed)
 Do you have the # for [facility] so we can call and ask if they are comfortable removing her staples on 04/30/23? If they are, she doesn't have to come here on 2/7 and we will see her on 3/3 (with xrays) instead.Aaron AasAaron Aas

## 2023-04-28 NOTE — Plan of Care (Signed)

## 2023-04-28 NOTE — Discharge Summary (Signed)
 Physician Discharge Summary  Patient: Summer Murphy FMW:[id number] DOB: 07-11-1955   Code Status: Full Code Admit date: 04/16/2023 Discharge date: 04/28/2023 Disposition: Skilled nursing facility, PT, OT, nurse aid, RN, and wound care PCP: Liana Fish, NP  Recommendations for Outpatient Follow-up:  Follow up with PCP within 1-2 weeks Regarding general hospital follow up and preventative care Recommend addressing arthritis care Follow up with neurosurgery  Regarding staple removal and post-op care around 2/9 Follow up with ID Regarding antibiotic treatment Follow up with cardiology Regarding heart failure  Discharge Diagnoses:  Principal Problem:   Sepsis (HCC) Active Problems:   T2DM (type 2 diabetes mellitus) (HCC)   Fever   Falls   Chronic pain syndrome   Asthma   Restless leg   Migraine   Anxiety and depression   Acid reflux   Electrolyte abnormality   Microcytic anemia   MSSA bacteremia   Surgical site infection   Vertebral osteomyelitis (HCC)   Lumbar discitis   Infection of cervical spine [facility])   [facility] Course Summary: 68 year old female with DM2, recent cervical fusion last month and chronic hip arthritis was admitted 04/16/2023 with sepsis after fall.  Workup revealed MSSA bacteremia and abscess/discitis at cervical fusion site.  Patient underwent drainage via neurosurgery and was placed on cefazolin  and rifampin .  Subsequently, PICC line placed for 6 weeks of IV cafazolin until 3/16 and then will be transitioned to PO cefadroxil . Follow up with ID outpatient.  Patient had persistent lower back and bilateral leg pain particularly in R hip joint. MRI showed multilevel DJD although it was motion degraded. Repeat CT imaging on 2/2 revealed psoas abscesses which underwent drainage by IR. Cultures were negative.  Imaging also revealed what appeared to be non-occlusive thrombus in IVC. Unlikely infectious as repeat bloood cultures negative.  Started on heparin  drip per vascular surgery recs and then transitioned to eliquis . Will need about 3 months of treatment and reevaluation imaging to make decision about continuing therapy or not.   TTE with EF of 45 to 50%, some concern of regional wall motion abnormalities likely due to LBBB, discussed with cardiology and they are recommending outpatient follow-up.  Appointment was set with Dr. Gollan in February.   patient doing well. PT evaluated and recommended SNF at dc. She had ongoing right hip joint (which presents in the groin) and was well managed with oral and topical pain agents. Recommend avoiding prolonged course in bed to avoid stiffening. mobilize patient as much as possible.   All other chronic conditions were treated with home medications.    Discharge Condition: Good, improved Recommended discharge diet: Regular healthy diet  Consultations: Neurosurgery  ID  Procedures/Studies: I&D of cervical spine Aspiration of psoas abscess  Discharge Instructions     Anaphylaxis Kit: Provided to treat any anaphylactic reaction to the medication being provided to the patient if First Dose or when requested by physician   Complete by: As directed    Epinephrine  1mg /ml vial / amp: Administer 0.3mg  (0.22ml) subcutaneously once for moderate to severe anaphylaxis, nurse to call physician and pharmacy when reaction occurs and call 911 if needed for immediate care   Diphenhydramine  50mg /ml IV vial: Administer 25-50mg  IV/IM PRN for first dose reaction, rash, itching, mild reaction, nurse to call physician and pharmacy when reaction occurs   Sodium Chloride  0.9% NS 500ml IV: Administer if needed for hypovolemic blood pressure drop or as ordered by physician after call to physician with anaphylactic reaction   Change  dressing on IV access line weekly and PRN   Complete by: As directed    Flush IV access with Sodium Chloride  0.9% and Heparin  10 units/ml or 100 units/ml   Complete by: As  directed    Home infusion instructions - [facility]   Complete by: As directed    Instructions: Flush IV access with Sodium Chloride  0.9% and Heparin  10units/ml or 100units/ml   Change dressing on IV access line: Weekly and PRN   Instructions Cath Flo 2mg : Administer for PICC Line occlusion and as ordered by physician for other access device   [facility] pharmacist to adjust dose for: Vancomycin , Aminoglycosides and other anti-infective therapies as requested by physician   Method of administration may be changed at the discretion of home infusion pharmacist based upon assessment of the patient and/or caregiver's ability to self-administer the medication ordered   Complete by: As directed       Allergies as of 04/28/2023       Reactions   Librium [chlordiazepoxide] Shortness Of Breath   Reglan [metoclopramide] Hives, Other (See Comments)   hallucinations   Vanilla Shortness Of Breath   Pt reports allergy to vanilla extract only   Aspirin Hives   Nsaids Rash   Rash/flares asthma issues.   Azithromycin  Other (See Comments)   Unsure of what the reaction was.   Buprenorphine Hcl Other (See Comments)   Unsure of what the reaction was.   Flexeril  [cyclobenzaprine ]    hallucinations   Morphine Other (See Comments)   Unsure of reaction   Sulfa Antibiotics    Other reaction(s): Unknown   Tolmetin    Other Reaction: Allergy   Amlodipine  Besylate Itching, Rash   arms, stomach and forehead   Iron  Nausea And Vomiting        Medication List     STOP taking these medications    Accu-Chek Guide Me w/Device Kit   Accu-Chek Guide test strip Generic drug: glucose blood   Accu-Chek Softclix Lancets lancets   celecoxib  100 MG capsule Commonly known as: CeleBREX    diltiazem  120 MG 24 hr capsule Commonly known as: Cardizem  CD   lidocaine  2 % solution Commonly known as: XYLOCAINE    oxyCODONE  5 MG immediate release tablet Commonly known as: Oxy  IR/ROXICODONE    tiZANidine  4 MG tablet Commonly known as: ZANAFLEX        TAKE these medications    acetaminophen  500 MG tablet Commonly known as: TYLENOL  Take 2 tablets (1,000 mg total) by mouth every 6 (six) hours as needed.   alendronate  70 MG tablet Commonly known as: FOSAMAX  Take 1 tablet (70 mg total) by mouth once a week. Take with a full glass of water  on an empty stomach.   apixaban  5 MG Tabs tablet Commonly known as: ELIQUIS  Take 2 tablets (10 mg total) by mouth 2 (two) times daily for 6 days, THEN 1 tablet (5 mg total) 2 (two) times daily. Start taking on: April 28, 2023   busPIRone  10 MG tablet Commonly known as: BUSPAR  Take 1 tablet (10 mg total) by mouth 2 (two) times daily.   ceFAZolin  IVPB Commonly known as: ANCEF  Inject 2 g into the vein every 8 (eight) hours. Indication:  MSSA bacteremia and surgical site infection Last Day of Therapy:  05/28/2023 Labs - Once weekly:  CBC/D, CMP, and ESR and CRP Method of administration: IV Push. Method of administration may be changed at the discretion of facility and its pharmacy. Fax weekly lab results  promptly  to [phone]. Please pull PIC at completion of IV antibiotics. Call [phone] with any critical value or questions.   clonazePAM  1 MG tablet Commonly known as: KLONOPIN  Take 0.5-1 tablets (0.5-1 mg total) by mouth 2 (two) times daily as needed for anxiety.   diclofenac  Sodium 1 % Gel Commonly known as: VOLTAREN  Apply 4 g topically 4 (four) times daily. What changed:  when to take this reasons to take this   DULoxetine  60 MG capsule Commonly known as: CYMBALTA  TAKE 1 CAPSULE(60 MG) BY MOUTH AT BEDTIME   Fe Fum-Vit C-Vit B12-FA Caps capsule Commonly known as: TRIGELS-F FORTE Take 1 capsule by mouth 2 (two) times daily.   fluticasone -salmeterol 250-50 MCG/ACT Aepb Commonly known as: Wixela Inhub Inhale 1 puff into the lungs in the morning and at bedtime.   gabapentin  300 MG  capsule Commonly known as: NEURONTIN  TAKE 1 CAPSULE(300 MG) BY MOUTH TWICE DAILY What changed: Another medication with the same name was removed. Continue taking this medication, and follow the directions you see here.   HYDROcodone -acetaminophen  5-325 MG tablet Commonly known as: NORCO/VICODIN Take 1 tablet by mouth every 4 (four) hours as needed for up to 7 days for moderate pain (pain score 4-6) or severe pain (pain score 7-10).   ipratropium-albuterol  0.5-2.5 (3) MG/3ML Soln Commonly known as: DUONEB Take 3 mLs by nebulization every 6 (six) hours as needed. What changed:  when to take this reasons to take this Another medication with the same name was removed. Continue taking this medication, and follow the directions you see here.   methocarbamol  750 MG tablet Commonly known as: ROBAXIN  Take 1 tablet (750 mg total) by mouth every 8 (eight) hours as needed for muscle spasms.   montelukast  10 MG tablet Commonly known as: SINGULAIR  TAKE 1 TABLET(10 MG) BY MOUTH AT BEDTIME   ondansetron  4 MG tablet Commonly known as: ZOFRAN  TAKE 1 TABLET BY MOUTH TWICE DAILY AS NEEDED   pantoprazole  40 MG tablet Commonly known as: PROTONIX  Take 40 mg by mouth 2 (two) times daily.   polyethylene glycol 17 g packet Commonly known as: MIRALAX  / GLYCOLAX  Take 17 g by mouth 2 (two) times daily.   rOPINIRole  1 MG tablet Commonly known as: REQUIP  Take 1 tablet (1 mg total) by mouth at bedtime. What changed:  medication strength See the new instructions.   senna 8.6 MG Tabs tablet Commonly known as: SENOKOT Take 1 tablet (8.6 mg total) by mouth 2 (two) times daily as needed for mild constipation.   SUMAtriptan  100 MG tablet Commonly known as: IMITREX  Take 1 tablet (100 mg total) by mouth every 2 (two) hours as needed (for migraine headaches.). May repeat in 1 hours if headache persists or recurs.   tirzepatide  2.5 MG/0.5ML Pen Commonly known as: MOUNJARO  Inject 2.5 mg into the skin once  a week.   traZODone  150 MG tablet Commonly known as: DESYREL  TAKE 1 TABLET(150 MG) BY MOUTH AT BEDTIME               Discharge Care Instructions  (From admission, onward)           Start     Ordered   04/28/23 0000  Change dressing on IV access line weekly and PRN  (Home infusion instructions - [facility] )        04/28/23 0927            Contact information for follow-up providers     Liana Fish, NP Follow up.  Specialty: Nurse Practitioner Why: Hospital follow up Contact information: [address] [place] [address] [phone]         Perla Evalene PARAS, MD Follow up in 3 week(s).   Specialty: Cardiology Why: You're appointment is on May 10, 2023 at [facility] information: [address] [place] [address] [phone]         Ulis Bottcher, PA-C Follow up on 04/30/2023.   Specialty: Physician Assistant Why: psot-op follow up and staple removal Contact information: [address] [place] [address] [phone]              Contact information for after-discharge care     Destination     [facility] Preferred SNF .   Service: Skilled Nursing Contact information: [address] [place]   [address] [phone]                     Subjective   Pt reports feeling well today. Continues to have right groin pain. We discussed recommendations to stay active, use voltaren  gel and avoid narcotics as able.   All questions and concerns were addressed at time of discharge.  Objective  Blood pressure 131/72, pulse (!) 109, temperature 98.4 F (36.9 C), resp. rate 16, height 5' 2.99 (1.6 m), weight 56.8 kg, SpO2 96%.   General: Pt is alert, awake, not in acute distress Cardiovascular: RRR, S1/S2 +, no rubs, no gallops Respiratory: CTA bilaterally, no wheezing, no rhonchi Abdominal: Soft, NT, ND, bowel sounds + Extremities: no  edema, no cyanosis. R groin pain.  Staples on posterior neck in place and no signs of infection. Healing well.   The results of significant diagnostics from this hospitalization (including imaging, microbiology, ancillary and laboratory) are listed below for reference.   Imaging studies: CT GUIDED SOFT TISSUE FLUID DRAIN BY PERC CATH Result Date: 04/25/2023 INDICATION: 68 year old with bilateral groin pain and recent CT imaging demonstrates bilateral psoas fluid collections that are concerning for infection. EXAM: 1. CT-guided aspiration of right psoas muscle 2. CT-guided placement of drain in left psoas abscess-drain was removed at the end of the procedure MEDICATIONS: Moderate sedation ANESTHESIA/SEDATION: Moderate (conscious) sedation was employed during this procedure. A total of Versed  1mg  and fentanyl  100 mcg was administered intravenously at the order of the provider performing the procedure. Total intra-service moderate sedation time: 40 minutes. Patient's level of consciousness and vital signs were monitored continuously by radiology nurse throughout the procedure under the supervision of the provider performing the procedure. COMPLICATIONS: None immediate. PROCEDURE: Informed written consent was obtained from the patient after a thorough discussion of the procedural risks, benefits and alternatives. All questions were addressed. Maximal Sterile Barrier Technique was utilized including caps, mask, sterile gowns, sterile gloves, sterile drape, hand hygiene and skin antiseptic. A timeout was performed prior to the initiation of the procedure. Patient was placed prone. CT images through the lower abdomen were obtained. The back was prepped and draped in sterile fashion. Skin was anesthetized with 1% lidocaine . Using CT guidance, an 18 gauge trocar needle was directed into the left psoas muscle near the small pockets of air. No purulent fluid could be aspirated. Attempted to advance a wire into the psoas  muscle but wire would not advance very far. Needle was repositioned but again no significant purulent fluid could be aspirated. Small drops of bloody fluid were obtained. This fluid was collected for culture. Attention was directed to the left psoas. Skin  was anesthetized with 1% lidocaine . Using CT guidance, a new 18 gauge trocar needle was directed into the left psoas muscle and bloody purulent fluid was draining from the needle. Superstiff Amplatz wire was advanced into the left psoas muscle. Tract was dilated to accommodate a 10 French multipurpose drain. Follow up CT images demonstrated that the drain was not tracking along the expected course of the abscess. Rather the drain was along the anterior aspect of the psoas muscle. The drain was pulled back and tried to reposition with a wire but again drain was not appropriately positioned. As a result, the drain was completely removed at the end of the procedure. RADIATION DOSE REDUCTION: This exam was performed according to the departmental dose-optimization program which includes automated exposure control, adjustment of the mA and/or kV according to patient size and/or use of iterative reconstruction technique. FINDINGS: Small foci of gas in the right psoas muscle. Needle was directed into the right psoas muscle but no purulent fluid could be aspirated. Small amount of bloody material was collected for culture. Needle was directed into left psoas abscess and bloody purulent fluid was aspirated. Approximately 5 mL of bloody purulent fluid was removed from the left psoas muscle. A drain was placed but the drain appeared to be tracking anterior to the psoas muscle and not adequately positioned to drain the abscess. Therefore this drain was removed. IMPRESSION: 1. CT-guided placement of a drain in the left psoas abscess. 5 mL of bloody purulent fluid was removed. However, the drain was not well positioned and eventually removed at the end of the procedure. 2.  CT-guided aspiration of a small amount of bloody fluid from the right psoas muscle. Fluid was sent for culture. Electronically Signed   By: Juliene Balder M.D.   On: 04/25/2023 18:31   CT ASPIRATION N/S Result Date: 04/25/2023 INDICATION: 67 year old with bilateral groin pain and recent CT imaging demonstrates bilateral psoas fluid collections that are concerning for infection. EXAM: 1. CT-guided aspiration of right psoas muscle 2. CT-guided placement of drain in left psoas abscess-drain was removed at the end of the procedure MEDICATIONS: Moderate sedation ANESTHESIA/SEDATION: Moderate (conscious) sedation was employed during this procedure. A total of Versed  1mg  and fentanyl  100 mcg was administered intravenously at the order of the provider performing the procedure. Total intra-service moderate sedation time: 40 minutes. Patient's level of consciousness and vital signs were monitored continuously by radiology nurse throughout the procedure under the supervision of the provider performing the procedure. COMPLICATIONS: None immediate. PROCEDURE: Informed written consent was obtained from the patient after a thorough discussion of the procedural risks, benefits and alternatives. All questions were addressed. Maximal Sterile Barrier Technique was utilized including caps, mask, sterile gowns, sterile gloves, sterile drape, hand hygiene and skin antiseptic. A timeout was performed prior to the initiation of the procedure. Patient was placed prone. CT images through the lower abdomen were obtained. The back was prepped and draped in sterile fashion. Skin was anesthetized with 1% lidocaine . Using CT guidance, an 18 gauge trocar needle was directed into the left psoas muscle near the small pockets of air. No purulent fluid could be aspirated. Attempted to advance a wire into the psoas muscle but wire would not advance very far. Needle was repositioned but again no significant purulent fluid could be aspirated. Small drops  of bloody fluid were obtained. This fluid was collected for culture. Attention was directed to the left psoas. Skin was anesthetized with 1% lidocaine . Using CT guidance, a new 18 gauge  trocar needle was directed into the left psoas muscle and bloody purulent fluid was draining from the needle. Superstiff Amplatz wire was advanced into the left psoas muscle. Tract was dilated to accommodate a 10 French multipurpose drain. Follow up CT images demonstrated that the drain was not tracking along the expected course of the abscess. Rather the drain was along the anterior aspect of the psoas muscle. The drain was pulled back and tried to reposition with a wire but again drain was not appropriately positioned. As a result, the drain was completely removed at the end of the procedure. RADIATION DOSE REDUCTION: This exam was performed according to the departmental dose-optimization program which includes automated exposure control, adjustment of the mA and/or kV according to patient size and/or use of iterative reconstruction technique. FINDINGS: Small foci of gas in the right psoas muscle. Needle was directed into the right psoas muscle but no purulent fluid could be aspirated. Small amount of bloody material was collected for culture. Needle was directed into left psoas abscess and bloody purulent fluid was aspirated. Approximately 5 mL of bloody purulent fluid was removed from the left psoas muscle. A drain was placed but the drain appeared to be tracking anterior to the psoas muscle and not adequately positioned to drain the abscess. Therefore this drain was removed. IMPRESSION: 1. CT-guided placement of a drain in the left psoas abscess. 5 mL of bloody purulent fluid was removed. However, the drain was not well positioned and eventually removed at the end of the procedure. 2. CT-guided aspiration of a small amount of bloody fluid from the right psoas muscle. Fluid was sent for culture. Electronically Signed   By: Juliene Balder M.D.   On: 04/25/2023 18:31   MR Lumbar Spine W Wo Contrast Result Date: 04/25/2023 CLINICAL DATA:  Low back pain, infection suspected, positive xray/CT EXAM: MRI LUMBAR SPINE WITHOUT AND WITH CONTRAST TECHNIQUE: Multiplanar and multiecho pulse sequences of the lumbar spine were obtained without and with intravenous contrast. CONTRAST:  5mL GADAVIST  GADOBUTROL  1 MMOL/ML IV SOLN COMPARISON:  MRI 04/19/2023, CT 04/24/2023 FINDINGS: Segmentation:  Standard. Alignment: Trace retrolisthesis of L1 on L2 and L5 on S1. Slight levocurvature. Vertebrae: Small amount of fluid within the L3-L4 disc space with endplate irregularity and increasing bone marrow edema in the L3 and L4 vertebral bodies. Corresponding low T1 bone marrow signal. There is also increasing bone marrow edema and low T1 signal changes associated with the L4-L5 disc space. Small amount of enhancing fluid in the anterior epidural space between the L3 and L4 levels with suspected epidural abscess in the left lateral recess at the level of the L4 vertebral body (series 11, image 23). No fractures. Conus medullaris and cauda equina: Conus extends to the L2 level. Conus and cauda equina appear normal. Paraspinal and other soft tissues: Rim enhancing bilateral psoas muscle abscesses. No posterior paraspinal muscle abscess. Disc levels: T12-L1 through L2-L3: Degenerative disc disease and facet arthropathy, unchanged from prior. L3-L4: Degree of disc height loss appears progressed compared to the recent prior. Increasing disc bulge and endplate spurring. Mild-to-moderate bilateral facet arthropathy. Moderate-severe canal stenosis with moderate bilateral foraminal stenosis. Findings have progressed from prior. L4-L5: Progressed disc height loss with new central disc protrusion. Mild-to-moderate bilateral facet arthropathy. Mild canal stenosis with moderate bilateral foraminal stenosis. Findings have progressed from prior. L5-S1:. Disc bulge and endplate  spurring, eccentric to the right. Mild bilateral facet arthropathy. Mild right subarticular recess stenosis without canal stenosis. Severe left and mild-moderate right  foraminal stenosis. No appreciable change. IMPRESSION: 1. Findings consistent with discitis-osteomyelitis at L3-L4 and L4-L5. Small amount of enhancing fluid in the anterior epidural space between the L3 and L4 levels with suspected epidural abscess in the left lateral recess at the level of the L4 vertebral body. 2. Bilateral psoas muscle abscesses. 3. Multilevel lumbar spondylosis, most pronounced at L3-L4 where there is moderate-severe canal stenosis and moderate bilateral foraminal stenosis. 4. Mild canal stenosis at L4-L5 with moderate bilateral foraminal stenosis. 5. Severe left and mild-moderate right foraminal stenosis at L5-S1. Electronically Signed   By: Mabel Converse D.O.   On: 04/25/2023 16:05   CT ABDOMEN PELVIS W WO CONTRAST Result Date: 04/24/2023 CLINICAL DATA:  Follow-up psoas muscle abscess. EXAM: CT ABDOMEN AND PELVIS WITHOUT AND WITH CONTRAST TECHNIQUE: Multidetector CT imaging of the abdomen and pelvis was performed following the standard protocol before and following the bolus administration of intravenous contrast. RADIATION DOSE REDUCTION: This exam was performed according to the departmental dose-optimization program which includes automated exposure control, adjustment of the mA and/or kV according to patient size and/or use of iterative reconstruction technique. CONTRAST:  80mL OMNIPAQUE  IOHEXOL  300 MG/ML  SOLN COMPARISON:  04/16/2023 FINDINGS: Lower chest: Patchy areas of ground-glass attenuation are again seen within the right middle lobe in both lower lobes. Bronchiectasis identified within the lingula. No pleural fluid or consolidative change. Hepatobiliary: No suspicious liver lesion. Status post cholecystectomy. Mild intrahepatic bile duct dilatation. Common bile duct measures 8 mm in diameter. This is unchanged  compared with the previous exam. In the absence of clinical signs or symptoms of biliary obstruction these findings likely represent post cholecystectomy physiology. Clinical correlation advised. Pancreas: Unremarkable. No pancreatic ductal dilatation or surrounding inflammatory changes. Spleen: Normal in size without focal abnormality. Adrenals/Urinary Tract: Normal adrenal glands. No kidney mass or obstructive uropathy. The bladder appears within normal limits. Stomach/Bowel: Postoperative changes about the GE junction with signs of recurrent moderate hiatal hernia. No pathologic dilatation of the large or small bowel loops to suggest obstruction. No bowel wall thickening, inflammation, or distension. Mild stool burden identified within the transverse colon. Vascular/Lymphatic: Aortic atherosclerosis. No aneurysm. There is a small mural based filling defect involving the IVC at the level of the bifurcation which appears mural based and may be adherent to the wall of the IVC compatible with thrombus, image 43/7. This does not appear to be occlusive. No signs of abdominopelvic adenopathy. Reproductive: Status post hysterectomy. No adnexal masses. Other: Peripherally enhancing fluid collection within the left psoas muscle measures 3.0 by 2.2 by 8.3 cm, image 52/10. This is suboptimally visualized on the unenhanced CT from 04/16/2023. Within the right psoas muscle there is a complex, rim enhancing fluid collection containing small foci of gas measuring 3.2 x 3.3 by 6.1 cm. Along the medial margin of the psoas muscle there is a small punctate phos is of gas, image 42/7. Additionally, there is increased soft tissue thickening within the paravertebral space about the L3-4 level containing a small punctate focus of gas, image 35/7. Musculoskeletal: No acute osseous findings. Lumbar degenerative disc disease identified. No definite focal bone erosions involving the endplates at the L3-4 level where there is increased  paravertebral soft tissue containing a small focus of gas. IMPRESSION: 1. There is a complex, rim enhancing fluid collection containing small foci of gas within the right psoas muscle measuring 3.2 x 3.3 x 6.1 cm. This is compatible with psoas abscess. 2. There is a peripherally enhancing fluid collection within the left psoas  muscle measuring 3.0 x 2.2 x 8.3 cm. This is suboptimally visualized on the unenhanced CT from 04/16/2023. This is also compatible with psoas abscess. 3. Increased soft tissue thickening within the paravertebral space about the L3-4 level containing a small punctate focus of gas. This is concerning for paravertebral soft tissue infection. No definite focal bone erosions involving the endplates at the L3-4 level. If clinically indicated repeat MRI with and without contrast material of the lumbar spine would be advised to assess for involvement of the disc space. 4. Nonocclusive filling defect within the IVC at the level of the bifurcation compatible with thrombus. 5. Postoperative changes about the GE junction with signs of recurrent moderate hiatal hernia. 6. Patchy areas of ground-glass attenuation are again seen within the right middle lobe in both lower lobes. Bronchiectasis identified within the lingula. Imaging findings are likely the sequelae of multifocal inflammation/infection. 7. Aortic Atherosclerosis (ICD10-I70.0). Electronically Signed   By: Waddell Calk M.D.   On: 04/24/2023 14:27   CT HIP RIGHT W CONTRAST Result Date: 04/24/2023 CLINICAL DATA:  Acute right hip pain with limping.  Swelling. EXAM: CT OF THE LOWER RIGHT EXTREMITY WITH CONTRAST TECHNIQUE: Multidetector CT imaging of the lower right extremity was performed according to the standard protocol following intravenous contrast administration. RADIATION DOSE REDUCTION: This exam was performed according to the departmental dose-optimization program which includes automated exposure control, adjustment of the mA and/or kV  according to patient size and/or use of iterative reconstruction technique. CONTRAST:  80mL OMNIPAQUE  IOHEXOL  300 MG/ML  SOLN COMPARISON:  CT pelvis 04/16/2023 FINDINGS: Bones/Joint/Cartilage Chondrocalcinosis in the pubic symphysis. Mild to moderate degenerative chondral thinning in the right hip. Ligaments Suboptimally assessed by CT. Muscles and Tendons Compared to 04/16/2023 there appears to be expansion of the right distal psoas muscle concerning for inflammation or infection. Reviewing the psoas musculature on recent lumbar MRI of 04/19/2023, the region is highly obscured and blurred by motion artifact, but given the findings on today's exam I do have concern both psoas muscles may have accentuated T2 signal which could be indicators of inflammation, hematoma, or infection. Contrast enhanced CT abdomen/pelvis with and without contrast, if feasible, might be utilized to reassess the bilateral psoas musculature with less detrimental effect of motion artifact. Soft tissues Trace subcutaneous edema lateral to the right hip. Mild sigmoid colon diverticulosis. IMPRESSION: 1. Abnormal expansion and likely inflammation/edema of the distal right psoas muscle. Severely limited 04/19/2023 lumbar MRI due to motion artifact clouds assessment, but in the context of today's CT findings, concern is raised for bilateral psoas muscle inflammation/infection and discitis-osteomyelitis at the L3-4 level. 2. Mild to moderate degenerative chondral thinning in the right hip. 3. Chondrocalcinosis in the pubic symphysis. 4. Mild sigmoid colon diverticulosis. Electronically Signed   By: Ryan Salvage M.D.   On: 04/24/2023 11:33   US  EKG SITE RITE Result Date: 04/20/2023 If Site Rite image not attached, placement could not be confirmed due to current cardiac rhythm.  MR LUMBAR SPINE WO CONTRAST Result Date: 04/19/2023 CLINICAL DATA:  Lumbar radiculopathy, prior surgery, new symptoms. EXAM: MRI LUMBAR SPINE WITHOUT CONTRAST  TECHNIQUE: Multiplanar, multisequence MR imaging of the lumbar spine was performed. No intravenous contrast was administered. COMPARISON:  MRI of the lumbar spine November 05, 2005. FINDINGS: The study is severely degraded by motion. Segmentation: 5 lumbar type vertebrae assumed. Alignment:  Small retrolisthesis at L1-2 and L4-5. Vertebrae: STIR images suggest endplate marrow edema at L3-4. However, these images are severely degraded by motion. T12-L1 appear  chronically fused. No fracture with significant height loss of retropulsion. Conus medullaris and cauda equina: Conus extends to the L1-2 level. Paraspinal and other soft tissues: Grossly unremarkable. Disc levels: T12-L1: Fused level. No high-grade spinal canal or neural foraminal stenosis. L1-2: Disc bulge and facet degenerative changes with mild bilateral neural foraminal narrowing. No high-grade spinal canal stenosis. L2-3: Disc bulge and facet degenerative changes results in likely moderate spinal canal stenosis and mild-to-moderate bilateral neural foraminal narrowing. L3-4: Likely mild to moderate spinal canal stenosis and mild bilateral neural foraminal narrowing. L4-5: Likely moderate spinal canal stenosis and moderate bilateral neural foraminal narrowing. L5-S1: There appears to be severe left and moderate right neural foraminal narrowing. No spinal canal stenosis. IMPRESSION: 1. Severely motion degraded study. Repeat study when clinically appropriate suggested. 2. Multilevel degenerative changes of the lumbar spine with likely moderate spinal canal stenosis at L2-3 and L4-5. 3. Multilevel neural foraminal narrowing, severe on the left at L5-S1. Electronically Signed   By: Katyucia  de Macedo Rodrigues M.D.   On: 04/19/2023 15:27   ECHOCARDIOGRAM COMPLETE Result Date: 04/19/2023    ECHOCARDIOGRAM REPORT   Patient Name:   Summer Murphy Date of Exam: 04/18/2023 Medical Rec #:  [id number]         Height:       63.0 in Accession #:    [id number]         Weight:       125.2 lb Date of Birth:  [birth year]         BSA:          1.585 m Patient Age:    67 years          BP:           139/62 mmHg Patient Gender: F                 HR:           95 bpm. Exam Location:  [facility] Procedure: 2D Echo, Cardiac Doppler and Color Doppler Indications:     Bacteremia R78.81  History:         Patient has prior history of Echocardiogram examinations, most                  recent 04/21/2021. Migraine and COPD, Signs/Symptoms:Dyspnea and                  Shortness of Breath; Risk Factors:Diabetes.  Sonographer:     Thea Norlander RCS Referring Phys:  [id number] DLFJBBJ AMIN Diagnosing Phys: Annabella Scarce MD IMPRESSIONS  1. Left ventricular ejection fraction, by estimation, is 45 to 50%. The left ventricle has mildly decreased function. The left ventricle demonstrates regional wall motion abnormalities (see scoring diagram/findings for description). Left ventricular diastolic parameters are consistent with Grade I diastolic dysfunction (impaired relaxation).  2. Right ventricular systolic function is normal. The right ventricular size is normal. There is moderately elevated pulmonary artery systolic pressure.  3. Left atrial size was severely dilated.  4. The mitral valve is normal in structure. Mild to moderate mitral valve regurgitation. No evidence of mitral stenosis.  5. Tricuspid valve regurgitation is mild to moderate.  6. The aortic valve is tricuspid. Aortic valve regurgitation is not visualized. No aortic stenosis is present.  7. The inferior vena cava is dilated in size with <50% respiratory variability, suggesting right atrial pressure of 15 mmHg. FINDINGS  Left Ventricle: Left ventricular ejection fraction, by estimation, is 45 to 50%. The left ventricle has  mildly decreased function. The left ventricle demonstrates regional wall motion abnormalities. The left ventricular internal cavity size was normal in size. There is no left ventricular hypertrophy. Left ventricular  diastolic parameters are consistent with Grade I diastolic dysfunction (impaired relaxation). Indeterminate filling pressures.  LV Wall Scoring: The entire inferior wall, mid inferoseptal segment, and basal inferoseptal segment are hypokinetic. The entire anterior wall, entire lateral wall, entire anterior septum, and apex are normal. Right Ventricle: The right ventricular size is normal. No increase in right ventricular wall thickness. Right ventricular systolic function is normal. There is moderately elevated pulmonary artery systolic pressure. The tricuspid regurgitant velocity is 2.91 m/s, and with an assumed right atrial pressure of 15 mmHg, the estimated right ventricular systolic pressure is 48.9 mmHg. Left Atrium: Left atrial size was severely dilated. Right Atrium: Right atrial size was normal in size. Pericardium: There is no evidence of pericardial effusion. Mitral Valve: The mitral valve is normal in structure. Mild to moderate mitral valve regurgitation. No evidence of mitral valve stenosis. Tricuspid Valve: The tricuspid valve is normal in structure. Tricuspid valve regurgitation is mild to moderate. No evidence of tricuspid stenosis. Aortic Valve: The aortic valve is tricuspid. Aortic valve regurgitation is not visualized. No aortic stenosis is present. Aortic valve peak gradient measures 6.2 mmHg. Pulmonic Valve: The pulmonic valve was normal in structure. Pulmonic valve regurgitation is not visualized. No evidence of pulmonic stenosis. Aorta: The aortic root is normal in size and structure. Venous: The inferior vena cava is dilated in size with less than 50% respiratory variability, suggesting right atrial pressure of 15 mmHg. IAS/Shunts: No atrial level shunt detected by color flow Doppler.  LEFT VENTRICLE PLAX 2D LVIDd:         5.00 cm      Diastology LVIDs:         4.30 cm      LV e' medial:    8.27 cm/s LV PW:         0.80 cm      LV E/e' medial:  10.1 LV IVS:        0.70 cm      LV e' lateral:    12.30 cm/s LVOT diam:     2.00 cm      LV E/e' lateral: 6.8 LV SV:         51 LV SV Index:   32 LVOT Area:     3.14 cm  LV Volumes (MOD) LV vol d, MOD A2C: 138.0 ml LV vol d, MOD A4C: 99.1 ml LV vol s, MOD A2C: 65.8 ml LV vol s, MOD A4C: 42.6 ml LV SV MOD A2C:     72.2 ml LV SV MOD A4C:     99.1 ml LV SV MOD BP:      65.0 ml RIGHT VENTRICLE             IVC RV S prime:     13.50 cm/s  IVC diam: 2.20 cm TAPSE (M-mode): 2.4 cm LEFT ATRIUM             Index        RIGHT ATRIUM           Index LA diam:        4.30 cm 2.71 cm/m   RA Area:     13.10 cm LA Vol (A2C):   58.2 ml 36.73 ml/m  RA Volume:   30.50 ml  19.25 ml/m LA Vol (A4C):   77.1 ml 48.65 ml/m  LA Biplane Vol: 68.8 ml 43.41 ml/m  AORTIC VALVE AV Area (Vmax): 2.11 cm AV Vmax:        125.00 cm/s AV Peak Grad:   6.2 mmHg LVOT Vmax:      83.85 cm/s LVOT Vmean:     55.000 cm/s LVOT VTI:       0.161 m  AORTA Ao Root diam: 2.90 cm Ao Asc diam:  2.90 cm MITRAL VALVE                TRICUSPID VALVE MV Area (PHT): 5.54 cm     TR Peak grad:   33.9 mmHg MV Decel Time: 137 msec     TR Vmax:        291.00 cm/s MR Peak grad: 94.6 mmHg MR Vmax:      486.33 cm/s   SHUNTS MV E velocity: 83.30 cm/s   Systemic VTI:  0.16 m MV A velocity: 121.00 cm/s  Systemic Diam: 2.00 cm MV E/A ratio:  0.69 Annabella Scarce MD Electronically signed by Annabella Scarce MD Signature Date/Time: 04/19/2023/7:52:18 AM    Final    MR Cervical Spine W and Wo Contrast Result Date: 04/16/2023 CLINICAL DATA:  Myelopathy, acute, cervical spine. Fever. Posterior cervical fusion last month. EXAM: MRI CERVICAL SPINE WITHOUT AND WITH CONTRAST TECHNIQUE: Multiplanar and multiecho pulse sequences of the cervical spine, to include the craniocervical junction and cervicothoracic junction, were obtained without and with intravenous contrast. CONTRAST:  6mL GADAVIST  GADOBUTROL  1 MMOL/ML IV SOLN COMPARISON:  Cervical spine CT 04/16/2023 and MRI 11/18/2022 FINDINGS: Multiple sequences are severely motion  degraded. Alignment: Mild reversal of the normal cervical lordosis. No significant listhesis. Vertebrae: No fracture, suspicious marrow lesion, or evidence of discitis. C3-C6 posterior decompression and fusion. Predominately moderate disc space narrowing from C3-4 through C6-7 with chronic degenerative endplate changes Cord: Normal signal. Posterior Fossa, vertebral arteries, paraspinal tissues: Unremarkable posterior fossa. Preserved vertebral artery flow voids. 2.3 x 3.8 x 2.9 cm (AP x transverse x craniocaudal) fluid collection in the laminectomy bed at C3-4 with moderately thick rim enhancement. More superficial 2.1 x 4.1 x 6.0 cm fluid collection within the subcutaneous tissues along the posterior cervical incision extending from C3-C7, also with peripheral enhancement. Enhancement in the surrounding soft tissues. Disc levels: C2-3: Left facet ankylosis.  No stenosis. C3-4: Posterior decompression and fusion. Patent spinal canal. Limited foraminal assessment due to artifact. C4-5: Posterior decompression and fusion. Patent spinal canal. Limited foraminal assessment due to artifact. C5-6: Posterior decompression and fusion. Mild residual spinal stenosis based on sagittal images. Suspected moderate residual left neural foraminal stenosis. C6-7: Disc bulging, uncovertebral spurring, and facet arthrosis result in mild spinal stenosis and suspected moderate to severe right and moderate left neural foraminal stenosis. C7-T1: Mild disc bulging, uncovertebral spurring, and left facet arthrosis without evidence of significant stenosis. IMPRESSION: 1. Severely motion degraded examination. 2. C3-C6 posterior decompression and fusion with improved spinal canal patency. 4 cm fluid collection in the laminectomy bed at C3-4 and 6 cm superficial fluid collection along the posterior incision with infection/abscess is not excluded by imaging. 3. Mild spinal stenosis and moderate to severe right and moderate left neural foraminal  stenosis at C6-7. Electronically Signed   By: Dasie Hamburg M.D.   On: 04/16/2023 16:42   CT ABDOMEN PELVIS WO CONTRAST Result Date: 04/16/2023 CLINICAL DATA:  Fall. EXAM: CT ABDOMEN AND PELVIS WITHOUT CONTRAST TECHNIQUE: Multidetector CT imaging of the abdomen and pelvis was performed following the standard protocol without IV contrast. RADIATION  DOSE REDUCTION: This exam was performed according to the departmental dose-optimization program which includes automated exposure control, adjustment of the mA and/or kV according to patient size and/or use of iterative reconstruction technique. COMPARISON:  CT abdomen/pelvis dated June 03, 2021. FINDINGS: Lower chest: Moderate-sized hiatal hernia, unchanged. No acute abnormality. Hepatobiliary: Hepatic steatosis. No focal liver abnormality is seen. Status post cholecystectomy. No biliary dilatation. Pancreas: Unremarkable. No pancreatic ductal dilatation or surrounding inflammatory changes. Spleen: Normal in size without focal abnormality. Adrenals/Urinary Tract: Adrenal glands are unremarkable. Nonobstructive small bilateral renal calculi, measuring up to 3 mm. No hydronephrosis. Bladder is unremarkable. Stomach/Bowel: Moderate-sized hiatal hernia. The stomach is otherwise unremarkable. Status post appendectomy. Mildly dilated transverse and descending colon without evidence of obstruction. No inflammatory changes. Sigmoid colonic diverticulosis without evidence of acute diverticulitis. Vascular/Lymphatic: The abdominal aorta is normal in caliber. Aortic atherosclerosis. No enlarged abdominal or pelvic lymph nodes. Reproductive: Status post hysterectomy. No adnexal masses. Other: No abdominopelvic ascites.  No intraperitoneal free air. Musculoskeletal: No acute osseous abnormality. Multilevel degenerative changes of the thoracic and lumbar spine with stable ankylosis at T12-L1. No suspicious lytic or blastic osseous lesion. IMPRESSION: 1. No acute localizing findings  in the abdomen or pelvis. 2. Moderate-sized hiatal hernia, unchanged. 3. Nonobstructive small bilateral renal calculi.  No hydronephrosis. 4. Sigmoid colonic diverticulosis without evidence of acute diverticulitis. 5. Hepatic steatosis Aortic Atherosclerosis (ICD10-I70.0). Electronically Signed   By: Harrietta Sherry M.D.   On: 04/16/2023 11:59   DG Knee Complete 4 Views Right Result Date: 04/16/2023 CLINICAL DATA:  Fall. EXAM: RIGHT KNEE - COMPLETE 4+ VIEW COMPARISON:  Right knee radiographs dated 11/14/2019. FINDINGS: Status post right knee arthroplasty in appropriate alignment. Hardware appears intact. No acute fracture or dislocation. Similar hypertrophic osseous changes of the patella. Intra-articular bodies are noted. Mild prepatellar soft tissue swelling. IMPRESSION: Status post right knee arthroplasty.  No acute osseous abnormality. Electronically Signed   By: Harrietta Sherry M.D.   On: 04/16/2023 11:39   DG Knee Complete 4 Views Left Result Date: 04/16/2023 CLINICAL DATA:  Fall. EXAM: LEFT KNEE - COMPLETE 4+ VIEW COMPARISON:  Left knee radiographs dated January 27, 2020. FINDINGS: Status post left total knee arthroplasty in appropriate alignment. Remote healed periprosthetic fracture deformity of the distal left femur. Hardware appears intact. No evidence of acute fracture or dislocation. There is a small joint effusion. Prepatellar soft tissue swelling. IMPRESSION: 1. Status post left total knee arthroplasty in appropriate alignment. No acute osseous abnormality. 2. Small knee joint effusion. Electronically Signed   By: Harrietta Sherry M.D.   On: 04/16/2023 11:34   DG Chest 2 View Result Date: 04/16/2023 CLINICAL DATA:  Fall. EXAM: CHEST - 2 VIEW COMPARISON:  Chest radiograph dated September 16, 2022. FINDINGS: The heart size and mediastinal contours are within normal limits. Moderate-to-large hiatal hernia is again noted. No focal consolidation, pleural effusion, or pneumothorax. Surgical clips  project over the lower thorax and epigastric region. Postoperative changes of the cervical spine. No acute osseous abnormality. IMPRESSION: No acute findings in the chest. Electronically Signed   By: Harrietta Sherry M.D.   On: 04/16/2023 11:30   DG Hip Unilat W or Wo Pelvis 2-3 Views Left Result Date: 04/16/2023 CLINICAL DATA:  Pain after fall 2 days ago. EXAM: DG HIP (WITH OR WITHOUT PELVIS) 2-3V LEFT COMPARISON:  Same day CT abdomen/pelvis. FINDINGS: There is no evidence of hip fracture or dislocation. Mild degenerative changes of the left hip. The sacroiliac joints and pubic symphysis are anatomically aligned.  Degenerative changes of the visualized lower lumbar spine. IMPRESSION: No acute osseous abnormality. Electronically Signed   By: Harrietta Sherry M.D.   On: 04/16/2023 11:22   CT HEAD WO CONTRAST ( ) Result Date: 04/16/2023 CLINICAL DATA:  Provided history: Head trauma, minor.  Neck trauma. EXAM: CT HEAD WITHOUT CONTRAST CT CERVICAL SPINE WITHOUT CONTRAST TECHNIQUE: Multidetector CT imaging of the head and cervical spine was performed following the standard protocol without intravenous contrast. Multiplanar CT image reconstructions of the cervical spine were also generated. RADIATION DOSE REDUCTION: This exam was performed according to the departmental dose-optimization program which includes automated exposure control, adjustment of the mA and/or kV according to patient size and/or use of iterative reconstruction technique. COMPARISON:  Head CT 08/18/2019. Cervical spine radiographs 04/02/2023. FINDINGS: CT HEAD FINDINGS Brain: Mild generalized cerebral atrophy. There is no acute intracranial hemorrhage. No demarcated cortical infarct. No extra-axial fluid collection. No evidence of an intracranial mass. No midline shift. Vascular: No hyperdense vessel.  Atherosclerotic calcifications. Skull: No calvarial fracture or aggressive osseous lesion. Sinuses/Orbits: No mass or acute finding within the  imaged orbits. 11 mm mucous retention cyst within the right maxillary sinus. 12 mm mucous retention cyst within the left maxillary sinus. CT CERVICAL SPINE FINDINGS A small portion of the C7 vertebral body is excluded from the field of view anteroinferiorly. Within this limitation, findings are as follows. Alignment: No significant spondylolisthesis. Skull base and vertebrae: The basion-dental and atlanto-dental intervals are maintained.No evidence of acute fracture to the cervical spine. Prior posterior decompression at the C3-C5 levels. A posterior spinal fusion construct spans the C3-C6 levels. Facet ankylosis on the left at C2-C3. Soft tissues and spinal canal: No prevertebral fluid or swelling. No visible canal hematoma. Disc levels: Postoperative changes as described above. Cervical spondylosis. Disc space narrowing is greatest at C5-C6 (advanced at this level). Central disc protrusion at C3-C4. Multilevel endplate spurring and uncovertebral hypertrophy. Streak/beam hardening artifact arising from spinal fusion hardware partially obscures the spinal canal at the operative levels. Within this limitation, there is no appreciable high-grade spinal canal stenosis. Multilevel bony neural foraminal narrowing. Upper chest: The right lung apex is excluded from the field of view. No consolidation within visualized portions of the left lung apex. No visible pneumothorax. IMPRESSION: CT head: 1. No evidence of an acute intracranial abnormality. 2. Mild cerebral atrophy. 3. Bilateral maxillary sinus mucous retention cysts (measuring up to 12 mm). CT cervical spine: 1. A small portion of the C7 vertebral body is excluded from the field of view anteroinferiorly. 2. Within this limitation, there is no evidence of an acute cervical spine fracture. 3. Cervical spondylosis and postoperative changes as described. 4. Left C2-C3 facet ankylosis. Electronically Signed   By: Rockey Childs D.O.   On: 04/16/2023 11:15   CT Cervical  Spine Wo Contrast Result Date: 04/16/2023 CLINICAL DATA:  Provided history: Head trauma, minor.  Neck trauma. EXAM: CT HEAD WITHOUT CONTRAST CT CERVICAL SPINE WITHOUT CONTRAST TECHNIQUE: Multidetector CT imaging of the head and cervical spine was performed following the standard protocol without intravenous contrast. Multiplanar CT image reconstructions of the cervical spine were also generated. RADIATION DOSE REDUCTION: This exam was performed according to the departmental dose-optimization program which includes automated exposure control, adjustment of the mA and/or kV according to patient size and/or use of iterative reconstruction technique. COMPARISON:  Head CT 08/18/2019. Cervical spine radiographs 04/02/2023. FINDINGS: CT HEAD FINDINGS Brain: Mild generalized cerebral atrophy. There is no acute intracranial hemorrhage. No demarcated cortical infarct. No extra-axial  fluid collection. No evidence of an intracranial mass. No midline shift. Vascular: No hyperdense vessel.  Atherosclerotic calcifications. Skull: No calvarial fracture or aggressive osseous lesion. Sinuses/Orbits: No mass or acute finding within the imaged orbits. 11 mm mucous retention cyst within the right maxillary sinus. 12 mm mucous retention cyst within the left maxillary sinus. CT CERVICAL SPINE FINDINGS A small portion of the C7 vertebral body is excluded from the field of view anteroinferiorly. Within this limitation, findings are as follows. Alignment: No significant spondylolisthesis. Skull base and vertebrae: The basion-dental and atlanto-dental intervals are maintained.No evidence of acute fracture to the cervical spine. Prior posterior decompression at the C3-C5 levels. A posterior spinal fusion construct spans the C3-C6 levels. Facet ankylosis on the left at C2-C3. Soft tissues and spinal canal: No prevertebral fluid or swelling. No visible canal hematoma. Disc levels: Postoperative changes as described above. Cervical spondylosis.  Disc space narrowing is greatest at C5-C6 (advanced at this level). Central disc protrusion at C3-C4. Multilevel endplate spurring and uncovertebral hypertrophy. Streak/beam hardening artifact arising from spinal fusion hardware partially obscures the spinal canal at the operative levels. Within this limitation, there is no appreciable high-grade spinal canal stenosis. Multilevel bony neural foraminal narrowing. Upper chest: The right lung apex is excluded from the field of view. No consolidation within visualized portions of the left lung apex. No visible pneumothorax. IMPRESSION: CT head: 1. No evidence of an acute intracranial abnormality. 2. Mild cerebral atrophy. 3. Bilateral maxillary sinus mucous retention cysts (measuring up to 12 mm). CT cervical spine: 1. A small portion of the C7 vertebral body is excluded from the field of view anteroinferiorly. 2. Within this limitation, there is no evidence of an acute cervical spine fracture. 3. Cervical spondylosis and postoperative changes as described. 4. Left C2-C3 facet ankylosis. Electronically Signed   By: Rockey Childs D.O.   On: 04/16/2023 11:15   DG Cervical Spine 2 or 3 views Result Date: 04/07/2023 CLINICAL DATA:  Recent cervical surgery and fall. EXAM: CERVICAL SPINE - 2-3 VIEW COMPARISON:  03/26/2023 FINDINGS: Status post posterior fusion of C3-C6. Hardware is intact. No evidence for loosening or fracture. The LOWER cervical levels are not well seen on the LATERAL view. Lung apices are clear. IMPRESSION: Status post posterior fusion of C3-C6.  No acute abnormality. Electronically Signed   By: Almarie Daring M.D.   On: 04/07/2023 07:57    Labs: Basic Metabolic Panel: Recent Labs  Lab 04/23/23 0545 04/24/23 0454 04/25/23 0508 04/26/23 0630  NA 139 135 137 137  K 3.6 3.5 3.2* 3.6  CL 97* 94* 99 97*  CO2 31 29 27 29   GLUCOSE 95 96 90 105*  BUN 14 12 7* 6*  CREATININE 0.44 0.44 <0.30* 0.38*  CALCIUM 8.0* 8.2* 7.9* 8.1*   CBC: Recent  Labs  Lab 04/24/23 0454 04/25/23 0508 04/26/23 0630 04/27/23 0618 04/28/23 0631  WBC 14.4* 13.9* 13.3* 13.3* 11.9*  NEUTROABS  --  11.3*  --   --   --   HGB 8.6* 7.9* 8.0* 7.7* 7.7*  HCT 27.0* 25.3* 25.6* 24.3* 24.2*  MCV 71.4* 74.4* 73.4* 73.0* 72.5*  PLT 515* 448* 491* 515* 520*   Microbiology: Results for orders placed or performed during the hospital encounter of 04/16/23  Resp panel by RT-PCR (RSV, Flu A&B, Covid) Anterior Nasal Swab     Status: None   Collection Time: 04/16/23 11:04 AM   Specimen: Anterior Nasal Swab  Result Value Ref Range Status   SARS Coronavirus 2  by RT PCR NEGATIVE NEGATIVE Final    Comment: (NOTE) SARS-CoV-2 target nucleic acids are NOT DETECTED.  The SARS-CoV-2 RNA is generally detectable in upper respiratory specimens during the acute phase of infection. The lowest concentration of SARS-CoV-2 viral copies this assay can detect is 138 copies/mL. A negative result does not preclude SARS-Cov-2 infection and should not be used as the sole basis for treatment or other patient management decisions. A negative result may occur with  improper specimen collection/handling, submission of specimen other than nasopharyngeal swab, presence of viral mutation(s) within the areas targeted by this assay, and inadequate number of viral copies(<138 copies/mL). A negative result must be combined with clinical observations, patient history, and epidemiological information. The expected result is Negative.  Fact Sheet for Patients:  bloggercourse.com  Fact Sheet for Healthcare Providers:  seriousbroker.it  This test is no t yet approved or cleared by the [place]  FDA and  has been authorized for detection and/or diagnosis of SARS-CoV-2 by FDA under an Emergency Use Authorization (EUA). This EUA will remain  in effect (meaning this test can be used) for the duration of the COVID-19 declaration under Section  564(b)(1) of the Act, 21 U.S.C.section 360bbb-3(b)(1), unless the authorization is terminated  or revoked sooner.       Influenza A by PCR NEGATIVE NEGATIVE Final   Influenza B by PCR NEGATIVE NEGATIVE Final    Comment: (NOTE) The Xpert Xpress SARS-CoV-2/FLU/RSV plus assay is intended as an aid in the diagnosis of influenza from Nasopharyngeal swab specimens and should not be used as a sole basis for treatment. Nasal washings and aspirates are unacceptable for Xpert Xpress SARS-CoV-2/FLU/RSV testing.  Fact Sheet for Patients: bloggercourse.com  Fact Sheet for Healthcare Providers: seriousbroker.it  This test is not yet approved or cleared by the [place]  FDA and has been authorized for detection and/or diagnosis of SARS-CoV-2 by FDA under an Emergency Use Authorization (EUA). This EUA will remain in effect (meaning this test can be used) for the duration of the COVID-19 declaration under Section 564(b)(1) of the Act, 21 U.S.C. section 360bbb-3(b)(1), unless the authorization is terminated or revoked.     Resp Syncytial Virus by PCR NEGATIVE NEGATIVE Final    Comment: (NOTE) Fact Sheet for Patients: bloggercourse.com  Fact Sheet for Healthcare Providers: seriousbroker.it  This test is not yet approved or cleared by the [place]  FDA and has been authorized for detection and/or diagnosis of SARS-CoV-2 by FDA under an Emergency Use Authorization (EUA). This EUA will remain in effect (meaning this test can be used) for the duration of the COVID-19 declaration under Section 564(b)(1) of the Act, 21 U.S.C. section 360bbb-3(b)(1), unless the authorization is terminated or revoked.  Performed at [facility], [address] [place], [place] [address]   Blood culture (routine x 2)     Status: Abnormal   Collection Time: 04/16/23 12:22 PM   Specimen: BLOOD  RIGHT ARM  Result Value Ref Range Status   Specimen Description   Final    BLOOD RIGHT ARM Performed at [facility], [address], [place], [place] [address]    Special Requests   Final    BOTTLES DRAWN AEROBIC AND ANAEROBIC Blood Culture results may not be optimal due to an inadequate volume of blood received in culture bottles Performed at [facility], [address] [place], [place] [address]    Culture  Setup Time   Final    GRAM POSITIVE COCCI IN BOTH AEROBIC AND ANAEROBIC  BOTTLES CRITICAL RESULT CALLED TO, READ BACK BY AND VERIFIED WITH: NATHAN BELUE @ 04/17/23 0115 BY AB Performed at [facility], [address], [place], [place] [address]    Culture STAPHYLOCOCCUS AUREUS (A)  Final   Report Status 04/19/2023 FINAL  Final   Organism ID, Bacteria STAPHYLOCOCCUS AUREUS  Final      Susceptibility   Staphylococcus aureus - MIC*    CIPROFLOXACIN  <=0.5 SENSITIVE Sensitive     ERYTHROMYCIN >=8 RESISTANT Resistant     GENTAMICIN  <=0.5 SENSITIVE Sensitive     OXACILLIN 0.5 SENSITIVE Sensitive     TETRACYCLINE >=16 RESISTANT Resistant     VANCOMYCIN  1 SENSITIVE Sensitive     TRIMETH/SULFA <=10 SENSITIVE Sensitive     CLINDAMYCIN RESISTANT Resistant     RIFAMPIN  <=0.5 SENSITIVE Sensitive     Inducible Clindamycin POSITIVE Resistant     LINEZOLID 2 SENSITIVE Sensitive     * STAPHYLOCOCCUS AUREUS  Blood culture (routine x 2)     Status: Abnormal   Collection Time: 04/16/23 12:22 PM   Specimen: BLOOD  Result Value Ref Range Status   Specimen Description   Final    BLOOD RIGHT ANTECUBITAL Performed at [facility], [address], [place], [place] [address]    Special Requests   Final    BOTTLES DRAWN AEROBIC AND ANAEROBIC Blood Culture results may not be optimal due to an inadequate volume of blood received in culture bottles Performed at [facility], [address] [place], [place] [address]    Culture  Setup Time   Final     GRAM POSITIVE COCCI IN BOTH AEROBIC AND ANAEROBIC BOTTLES CRITICAL VALUE NOTED.  VALUE IS CONSISTENT WITH PREVIOUSLY REPORTED AND CALLED VALUE. Performed at [facility], [address] [place], [place] [address]    Culture (A)  Final    STAPHYLOCOCCUS AUREUS SUSCEPTIBILITIES PERFORMED ON PREVIOUS CULTURE WITHIN THE LAST 5 DAYS. Performed at [facility], [address], [place], [place] [address]    Report Status 04/19/2023 FINAL  Final  Blood Culture ID Panel (Reflexed)     Status: Abnormal   Collection Time: 04/16/23 12:22 PM  Result Value Ref Range Status   Enterococcus faecalis NOT DETECTED NOT DETECTED Final   Enterococcus Faecium NOT DETECTED NOT DETECTED Final   Listeria monocytogenes NOT DETECTED NOT DETECTED Final   Staphylococcus species DETECTED (A) NOT DETECTED Final    Comment: CRITICAL RESULT CALLED TO, READ BACK BY AND VERIFIED WITH: NATHAN BELUE @ 04/17/23 0115 BY AB    Staphylococcus aureus (BCID) DETECTED (A) NOT DETECTED Final    Comment: CRITICAL RESULT CALLED TO, READ BACK BY AND VERIFIED WITH: NATHAN BELUE @ 04/17/23 0115 BY AB    Staphylococcus epidermidis NOT DETECTED NOT DETECTED Final   Staphylococcus lugdunensis NOT DETECTED NOT DETECTED Final   Streptococcus species NOT DETECTED NOT DETECTED Final   Streptococcus agalactiae NOT DETECTED NOT DETECTED Final   Streptococcus pneumoniae NOT DETECTED NOT DETECTED Final   Streptococcus pyogenes NOT DETECTED NOT DETECTED Final   A.calcoaceticus-baumannii NOT DETECTED NOT DETECTED Final   Bacteroides fragilis NOT DETECTED NOT DETECTED Final   Enterobacterales NOT DETECTED NOT DETECTED Final   Enterobacter cloacae complex NOT DETECTED NOT DETECTED Final   Escherichia coli NOT DETECTED NOT DETECTED Final   Klebsiella aerogenes NOT DETECTED NOT DETECTED Final   Klebsiella oxytoca NOT DETECTED NOT DETECTED Final   Klebsiella pneumoniae NOT DETECTED NOT DETECTED Final   Proteus species NOT  DETECTED NOT DETECTED Final   Salmonella species NOT DETECTED NOT DETECTED Final   Serratia marcescens NOT DETECTED NOT DETECTED Final   Haemophilus influenzae NOT DETECTED NOT DETECTED Final   Neisseria meningitidis NOT DETECTED NOT DETECTED Final   Pseudomonas aeruginosa NOT DETECTED NOT DETECTED Final   Stenotrophomonas maltophilia NOT DETECTED NOT DETECTED Final   Candida albicans NOT DETECTED NOT DETECTED Final   Candida auris NOT DETECTED NOT DETECTED Final   Candida glabrata NOT DETECTED NOT DETECTED Final   Candida krusei NOT DETECTED NOT DETECTED Final   Candida parapsilosis NOT DETECTED NOT DETECTED Final   Candida tropicalis NOT DETECTED NOT DETECTED Final   Cryptococcus neoformans/gattii NOT DETECTED NOT DETECTED Final   Meth resistant mecA/C and MREJ NOT DETECTED NOT DETECTED Final    Comment: Performed at [facility], [address], [place], [place] [address]  Urine Culture     Status: None   Collection Time: 04/16/23  2:31 PM   Specimen: Urine, Clean Catch  Result Value Ref Range Status   Specimen Description   Final    URINE, CLEAN CATCH Performed at [facility], [address], [place], [place] [address]    Special Requests   Final    NONE Performed at [facility], [address], [place], [place] [address]    Culture   Final    NO GROWTH Performed at [facility], [address], [place], [place] [address]    Report Status 04/18/2023 FINAL  Final  Aerobic Culture w Gram Stain (superficial specimen)     Status: None   Collection Time: 04/17/23  1:10 PM   Specimen: Wound  Result Value Ref Range Status   Specimen Description   Final    WOUND Performed at [facility], [address], [place], [place] [address]    Special Requests   Final    NONE Performed at [facility], [address] [place], [place] [address]    Gram Stain   Final    RARE WBC PRESENT, PREDOMINANTLY PMN NO ORGANISMS  SEEN Performed at [facility], [address], [place], [place] [address]    Culture RARE STAPHYLOCOCCUS AUREUS  Final   Report Status 04/20/2023 FINAL  Final   Organism ID, Bacteria STAPHYLOCOCCUS AUREUS  Final      Susceptibility   Staphylococcus aureus - MIC*    CIPROFLOXACIN  <=0.5 SENSITIVE Sensitive     ERYTHROMYCIN RESISTANT Resistant     GENTAMICIN  <=0.5 SENSITIVE Sensitive     OXACILLIN 0.5 SENSITIVE Sensitive     TETRACYCLINE <=1 SENSITIVE Sensitive     VANCOMYCIN  <=0.5 SENSITIVE Sensitive     TRIMETH/SULFA <=10 SENSITIVE Sensitive     CLINDAMYCIN RESISTANT Resistant     RIFAMPIN  <=0.5 SENSITIVE Sensitive     Inducible Clindamycin POSITIVE Resistant     LINEZOLID 2 SENSITIVE Sensitive     * RARE STAPHYLOCOCCUS AUREUS  Culture, blood (Routine X 2) w Reflex to ID Panel     Status: None   Collection Time: 04/18/23  9:55 AM   Specimen: BLOOD  Result Value Ref Range Status   Specimen Description BLOOD BLOOD LEFT HAND  Final   Special Requests   Final    BOTTLES DRAWN AEROBIC AND ANAEROBIC Blood Culture adequate volume   Culture   Final    NO GROWTH 5 DAYS Performed at [facility], [address] [place], [place] [address]    Report Status 04/23/2023 FINAL  Final  Culture, blood (Routine X 2) w Reflex to ID Panel     Status: None   Collection Time: 04/18/23 11:04 AM   Specimen: BLOOD LEFT HAND  Result Value Ref Range Status   Specimen Description BLOOD LEFT HAND  Final   Special Requests   Final    BOTTLES DRAWN AEROBIC AND ANAEROBIC Blood Culture adequate volume   Culture   Final    NO GROWTH 5 DAYS Performed at [facility], [address] [place], [place] [address]    Report Status 04/23/2023 FINAL  Final  Culture, blood (Routine X 2) w Reflex to ID Panel     Status: None (Preliminary result)   Collection Time: 04/25/23  5:08 AM   Specimen: BLOOD  Result Value Ref Range Status   Specimen Description BLOOD BLOOD LEFT ARM  Final    Special Requests   Final    BOTTLES DRAWN AEROBIC ONLY Blood Culture results may not be optimal due to an inadequate volume of blood received in culture bottles   Culture   Final    NO GROWTH 3 DAYS Performed at [facility], [address], [place], [place] [address]    Report Status PENDING  Incomplete  Culture, blood (Routine X 2) w Reflex to ID Panel     Status: None (Preliminary result)   Collection Time: 04/25/23  5:09 AM   Specimen: BLOOD  Result Value Ref Range Status   Specimen Description BLOOD BLOOD LEFT ARM  Final   Special Requests   Final    BOTTLES DRAWN AEROBIC ONLY Blood Culture results may not be optimal due to an inadequate volume of blood received in culture bottles   Culture   Final    NO GROWTH 3 DAYS Performed at [facility], [address] [place], [place] [address]    Report Status PENDING  Incomplete  Aerobic/Anaerobic Culture w Gram Stain (surgical/deep wound)     Status: None (Preliminary result)   Collection Time: 04/25/23 11:37 AM   Specimen: Wound; Abscess  Result Value Ref Range Status   Specimen Description   Final    WOUND Performed at [facility], [address], [place], [place] [address]    Special Requests   Final    MUSCLE LEFT Performed at [facility], [address] [place], [place] [address]    Gram Stain   Final    ABUNDANT WBC PRESENT,BOTH PMN AND MONONUCLEAR NO ORGANISMS SEEN    Culture   Final    NO GROWTH 2 DAYS NO ANAEROBES ISOLATED; CULTURE IN PROGRESS FOR 5 DAYS Performed at [facility], [address] [place], [place] [address]    Report Status PENDING  Incomplete  Aerobic/Anaerobic Culture w Gram Stain (surgical/deep wound)     Status: None (Preliminary result)   Collection Time: 04/25/23 11:37 AM   Specimen: Wound; Abscess  Result Value Ref Range Status   Specimen Description   Final    WOUND Performed at [facility], [address], [place], [place]  [address]    Special Requests   Final    LEFT PSOAS MUSCLE Performed at [facility], [address] [place], [place] [address]    Gram Stain   Final    FEW WBC PRESENT,BOTH PMN AND MONONUCLEAR NO ORGANISMS SEEN    Culture   Final    NO GROWTH 2 DAYS NO ANAEROBES ISOLATED; CULTURE IN PROGRESS FOR 5 DAYS Performed at [facility], [address], [place], [place] [address]    Report Status PENDING  Incomplete    Time coordinating discharge: Over 30 minutes  Marien LITTIE Piety, MD  [facility] 04/28/2023, 9:28 AM

## 2023-04-28 NOTE — Telephone Encounter (Signed)
 I spoke with Summer Murphy, nurse at [facility]. They will take out her staples on 2/7. They will take her for xrays on 3/2 or 3/3 and bring her to her appt on 3/3. They would like us  to fax them a copy of the xray order.  Summer Murphy Summer Murphy Fax # [phone]  I have ordered the xray, Dr Claudene has co-signed it, and I have faxed it to [facility] at [facility].

## 2023-04-30 ENCOUNTER — Encounter: Payer: Self-pay | Admitting: Oncology

## 2023-04-30 ENCOUNTER — Encounter: Payer: 59 | Admitting: Physician Assistant

## 2023-04-30 ENCOUNTER — Telehealth: Payer: Self-pay | Admitting: *Deleted

## 2023-04-30 LAB — AEROBIC/ANAEROBIC CULTURE W GRAM STAIN (SURGICAL/DEEP WOUND)
Culture: NO GROWTH
Culture: NO GROWTH

## 2023-04-30 LAB — CULTURE, BLOOD (ROUTINE X 2)
Culture: NO GROWTH
Culture: NO GROWTH

## 2023-04-30 NOTE — Progress Notes (Signed)
 Complex Care Management Note Care Guide Note  04/30/2023 Name: Summer Murphy MRN: [id number] DOB: 1955-10-30   Complex Care Management Outreach Attempts: An unsuccessful telephone outreach was attempted today to offer the patient information about available complex care management services.  Follow Up Plan:  Additional outreach attempts will be made to offer the patient complex care management information and services.   Encounter Outcome:  No Answer  Thedford Franks, CMA, Care Guide [facility]  [facility], [facility] Guide Direct Dial: [phone]  Fax: [phone] Website: [email or web address]

## 2023-05-03 NOTE — Progress Notes (Signed)
 Complex Care Management Note  Care Guide Note 05/03/2023 Name: ZYLEE MALICOAT MRN: [id number] DOB: 02-18-1956  LIYLA MCATEER is a 68 y.o. year old female who sees Laurence Pons, NP for primary care. I reached out to Zada Herrlich by phone today to offer complex care management services.  Ms. Dunavant was given information about Complex Care Management services today including:   The Complex Care Management services include support from the care team which includes your Nurse Care Manager, Clinical Social Worker, or Pharmacist.  The Complex Care Management team is here to help remove barriers to the health concerns and goals most important to you. Complex Care Management services are voluntary, and the patient may decline or stop services at any time by request to their care team member.   Complex Care Management Consent Status: Patient did not agree to participate in complex care management services at this time.  Follow up plan:  pt says she at [facility] and is receiving resources, declined any further needs at this time   Encounter Outcome:  Patient Refused  Kandis Ormond, CMA, Care Guide [facility]  [facility], [facility] Guide Direct Dial: [phone]  Fax: [phone] Website: [email or web address]

## 2023-05-05 ENCOUNTER — Encounter: Payer: Self-pay | Admitting: *Deleted

## 2023-05-08 NOTE — Progress Notes (Unsigned)
Cardiology Office Note  Date:  05/10/2023   ID:  ARASELY AKKERMAN, DOB April 19, 1955, MRN [id number]  PCP:  [name], NP   Chief Complaint  Patient presents with   [facility] follow up; 04/16/2023.    "Doing well."     HPI:  [name] is a 68 year old woman with past medical history of Hypertension Prediabetes Obesity left bundle branch block COPD /asthma, chronic SOB Who presents for follow-up of her shortness of breath, murmur, left bundle branch block, palpitations  Last seen by myself in clinic March 2023  Recent cervical fusion, Recent hospitalization February 2025 sepsis after fall Hospital records reviewed in detail MSSA bacteremia, abscess/discitis at cervical fusion site Underwent drainage by neurosurgery placed on antibiotics PICC line with antibiotics 6 weeks until March 16 At [facility]  Cardiac imaging reviewed Echo with EF 45 to 50% left bundle branch block Moderate pulmonary hypertension Severely dilated left atrium Mild to moderate TR  Echocardiogram results reviewed Study dated April 21, 2021 Normal ejection fraction, no significant valvular heart disease noted  Weight down 112 pounds,  3/23: weight 170  Reports not eating well, no appetite Working on leg strength at rehab Denies significant shortness of breath or chest pain on exertion or when working with PT  EKG personally reviewed by myself on todays visit EKG Interpretation Date/Time:  Monday May 10 2023 11:22:12 EST Ventricular Rate:  101 PR Interval:  128 QRS Duration:  120 QT Interval:  364 QTC Calculation: 471 R Axis:   -27  Text Interpretation: Sinus tachycardia with Premature atrial complexes Minimal voltage criteria for LVH, may be normal variant ( Cornell product ) Inferior infarct , age undetermined Anterolateral infarct , age undetermined When compared with ECG of 16-Apr-2023 11:12, Premature atrial complexes are now Present Left bundle branch block is no  longer Present Anterior infarct is now Present Anterolateral infarct is now Present Inferior infarct is now Present Confirmed by [name] [phone]) on 05/10/2023 12:01:20 PM     PMH:   has a past medical history of Acid reflux, Anemia, Anxiety, Arthritis, Asthma, Cervical disc disorder with radiculopathy of cervical region, Chronic pain syndrome, Chronic, continuous use of opioids, COPD (chronic obstructive pulmonary disease) (HCC), DOE (dyspnea on exertion), Hematuria, Hiatal hernia, History of kidney stones, Hypertension, Hypoglycemia, Insomnia, LBBB (left bundle branch block), Migraine, Murmur, Osteopenia, Palpitations, Pre-diabetes, Restless leg, Stenosis of cervical spine with myelopathy (HCC), and T2DM (type 2 diabetes mellitus) (HCC).  PSH:    Past Surgical History:  Procedure Laterality Date   ABDOMINAL HYSTERECTOMY     AMPUTATION TOE Left 07/31/2016   Procedure: AMPUTATION TOE/MPJ 2nd toe;  Surgeon: Linus Galas, DPM;  Location: [facility];  Service: Podiatry;  Laterality: Left;   APPENDECTOMY  1990   BACK SURGERY     low back   BREAST SURGERY     bilateral breast reduction   CARDIAC ELECTROPHYSIOLOGY STUDY AND ABLATION     CERVICAL WOUND DEBRIDEMENT N/A 04/17/2023   Procedure: CERVICAL WOUND DEBRIDEMENT;  Surgeon: Lucy Chris, MD;  Location: [facility];  Service: Neurosurgery;  Laterality: N/A;   CHOLECYSTECTOMY  1990   COLONOSCOPY WITH PROPOFOL N/A 01/04/2017   Procedure: COLONOSCOPY WITH PROPOFOL;  Surgeon: Scot Jun, MD;  Location[facility];  Service: Endoscopy;  Laterality: N/A;   CORNEAL TRANSPLANT     ESOPHAGOGASTRODUODENOSCOPY N/A 04/09/2021   Procedure: ESOPHAGOGASTRODUODENOSCOPY (EGD);  Surgeon: Toney Reil, MD;  Location: [facility] ENDOSCOPY;  Service: Gastroenterology;  Laterality: N/A;   ESOPHAGOGASTRODUODENOSCOPY (EGD) WITH  PROPOFOL N/A 01/04/2017   Procedure: ESOPHAGOGASTRODUODENOSCOPY (EGD) WITH PROPOFOL;  Surgeon: Scot Jun, MD;  Location:  [facility] ENDOSCOPY;  Service: Endoscopy;  Laterality: N/A;   EXCISION BONE CYST Left 07/31/2016   Procedure: EXCISION BONE CYST/exostectomy 28124/left 2nd;  Surgeon: Linus Galas, DPM;  Location: [facility];  Service: Podiatry;  Laterality: Left;   EXTRACORPOREAL SHOCK WAVE LITHOTRIPSY Left 09/12/2015   Procedure: EXTRACORPOREAL SHOCK WAVE LITHOTRIPSY (ESWL);  Surgeon: Vanna Scotland, MD;  Location: [facility];  Service: Urology;  Laterality: Left;   FRACTURE SURGERY     left foot   GUM SURGERY Left    gum infection   HARDWARE REMOVAL Left 11/14/2020   Procedure: HARDWARE REMOVAL;  Surgeon: Kennedy Bucker, MD;  Location: [facility];  Service: Orthopedics;  Laterality: Left;   HH repair     Fundoplication   JOINT REPLACEMENT Bilateral 2013,2014   total knees   LAPAROSCOPIC HYSTERECTOMY     LITHOTRIPSY     periprosthetic supracondylar fracture of left femur  02/16/2020   [facility]   POSTERIOR CERVICAL FUSION/FORAMINOTOMY N/A 03/02/2023   Procedure: C3-6 POSTERIOR CERVICAL LAMINECTOMY AND FUSION;  Surgeon: Lovenia Kim, MD;  Location: [facility];  Service: Neurosurgery;  Laterality: N/A;   TONSILLECTOMY     TOTAL KNEE REVISION Right 11/14/2019   Procedure: Revision patella and tibial polyethylene;  Surgeon: Kennedy Bucker, MD;  Location: [facility];  Service: Orthopedics;  Laterality: Right;   URETEROSCOPY      Current Outpatient Medications  Medication Sig Dispense Refill   acetaminophen (TYLENOL) 500 MG tablet Take 2 tablets (1,000 mg total) by mouth every 6 (six) hours as needed. 100 tablet 2   alendronate (FOSAMAX) 70 MG tablet Take 1 tablet (70 mg total) by mouth once a week. Take with a full glass of water on an empty stomach. 12 tablet 3   apixaban (ELIQUIS) 5 MG TABS tablet Take 5 mg by mouth 2 (two) times daily.     busPIRone (BUSPAR) 10 MG tablet Take 1 tablet (10 mg total) by mouth 2 (two) times daily. 60 tablet 2   ceFAZolin (ANCEF) IVPB Inject 2 g into the vein every 8 (eight)  hours. Indication:  MSSA bacteremia and surgical site infection Last Day of Therapy:  05/28/2023 Labs - Once weekly:  CBC/D, CMP, and ESR and CRP Method of administration: IV Push. Method of administration may be changed at the discretion of facility and its pharmacy. Fax weekly lab results  promptly to [phone]. Please pull PIC at completion of IV antibiotics. Call [phone] with any critical value or questions. 115 Units 0   clonazePAM (KLONOPIN) 1 MG tablet Take 0.5-1 tablets (0.5-1 mg total) by mouth 2 (two) times daily as needed for anxiety. 60 tablet 0   diclofenac Sodium (VOLTAREN) 1 % GEL Apply 4 g topically 4 (four) times daily. (Patient taking differently: Apply 4 g topically 4 (four) times daily as needed (pain).) 350 g 1   DULoxetine (CYMBALTA) 60 MG capsule TAKE 1 CAPSULE(60 MG) BY MOUTH AT BEDTIME 90 capsule 1   Fe Fum-Vit C-Vit B12-FA (TRIGELS-F FORTE) CAPS capsule Take 1 capsule by mouth 2 (two) times daily.     fluticasone-salmeterol (WIXELA INHUB) 250-50 MCG/ACT AEPB Inhale 1 puff into the lungs in the morning and at bedtime. 60 each 12   gabapentin (NEURONTIN) 300 MG capsule TAKE 1 CAPSULE(300 MG) BY MOUTH TWICE DAILY 60 capsule 2   ipratropium-albuterol (DUONEB) 0.5-2.5 (3) MG/3ML SOLN Take 3 mLs by nebulization every 6 (  six) hours as needed.     methocarbamol (ROBAXIN) 750 MG tablet Take 1 tablet (750 mg total) by mouth every 8 (eight) hours as needed for muscle spasms.     montelukast (SINGULAIR) 10 MG tablet TAKE 1 TABLET(10 MG) BY MOUTH AT BEDTIME 90 tablet 1   ondansetron (ZOFRAN) 4 MG tablet TAKE 1 TABLET BY MOUTH TWICE DAILY AS NEEDED 45 tablet 1   pantoprazole (PROTONIX) 40 MG tablet Take 40 mg by mouth 2 (two) times daily.     polyethylene glycol (MIRALAX / GLYCOLAX) 17 g packet Take 17 g by mouth 2 (two) times daily.     rOPINIRole (REQUIP) 1 MG tablet Take 1 tablet (1 mg total) by mouth at bedtime.     senna (SENOKOT) 8.6 MG TABS tablet Take 1 tablet (8.6 mg  total) by mouth 2 (two) times daily as needed for mild constipation. 30 tablet 0   SUMAtriptan (IMITREX) 100 MG tablet Take 1 tablet (100 mg total) by mouth every 2 (two) hours as needed (for migraine headaches.). May repeat in 1 hours if headache persists or recurs. 10 tablet 3   tirzepatide (MOUNJARO) 2.5 MG/0.5ML Pen Inject 2.5 mg into the skin once a week. 6 mL 2   traZODone (DESYREL) 150 MG tablet TAKE 1 TABLET(150 MG) BY MOUTH AT BEDTIME 30 tablet 2   No current facility-administered medications for this visit.     Allergies:   Librium [chlordiazepoxide], Reglan [metoclopramide], Vanilla, Aspirin, Nsaids, Azithromycin, Buprenorphine hcl, Flexeril [cyclobenzaprine], Morphine, Sulfa antibiotics, Tolmetin, Amlodipine besylate, and Iron   Social History:  The patient  reports that she has never smoked. She has been exposed to tobacco smoke. She has never used smokeless tobacco. She reports that she does not drink alcohol and does not use drugs.   Family History:   family history includes Breast cancer in her maternal aunt; Diabetes in her brother; Hypertension in her brother and mother; Kidney Stones in her father; Prostate cancer in her father; Stroke in her mother.    Review of Systems: Review of Systems  Constitutional: Negative.   HENT: Negative.    Respiratory:  Positive for shortness of breath.   Cardiovascular: Negative.   Gastrointestinal: Negative.   Musculoskeletal: Negative.   Neurological: Negative.   Psychiatric/Behavioral: Negative.    All other systems reviewed and are negative.   PHYSICAL EXAM: VS:  BP (!) 102/56 (BP Location: Left Arm, Patient Position: Sitting, Cuff Size: Normal)   Pulse (!) 101   Ht 5\' 3"  (1.6 m)   Wt 112 lb (50.8 kg)   SpO2 95%   BMI 19.84 kg/m  , BMI Body mass index is 19.84 kg/m. GEN: Well nourished, well developed, in no acute distress, presents in a wheelchair HEENT: normal Neck: no JVD, carotid bruits, or masses Cardiac: RRR; no  murmurs, rubs, or gallops,no edema  Respiratory:  clear to auscultation bilaterally, normal work of breathing GI: soft, nontender, nondistended, + BS MS: no deformity or atrophy Skin: warm and dry, no rash Neuro:  Strength and sensation are intact Psych: euthymic mood, full affect  Recent Labs: 08/07/2022: B Natriuretic Peptide 32.4 09/30/2022: TSH 2.079 04/21/2023: Magnesium 1.6 04/26/2023: BUN 6; Creatinine, Ser 0.38; Potassium 3.6; Sodium 137 04/27/2023: ALT 6 04/28/2023: Hemoglobin 7.7; Platelets 520    Lipid Panel Lab Results  Component Value Date   CHOL 166 10/11/2020   HDL 63 10/11/2020   LDLCALC 88 10/11/2020   TRIG 81 10/11/2020      Wt Readings from Last  3 Encounters:  05/10/23 112 lb (50.8 kg)  04/24/23 125 lb 3.5 oz (56.8 kg)  04/06/23 130 lb 9.6 oz (59.2 kg)       ASSESSMENT AND PLAN:  Problem List Items Addressed This Visit       Cardiology Problems   Essential hypertension   Relevant Medications   apixaban (ELIQUIS) 5 MG TABS tablet   Other Relevant Orders   EKG 12-Lead (Completed)     Other   COPD (chronic obstructive pulmonary disease) (HCC)   Relevant Orders   EKG 12-Lead (Completed)   SOB (shortness of breath) - Primary   Relevant Orders   EKG 12-Lead (Completed)    Shortness of breath Reports symptoms are mild, stable Likely secondary to deconditioning, asthma/COPD -EF 45 to 50% Working at [facility] with PT  Cardiomyopathy Ejection fraction estimated 45 to 50%, septal wall motion hypokinesis likely secondary to bundle branch block -Unable to review prior images 2023 -Reports that she feels well with no symptoms -She prefers no workup at this time -We did suggest for worsening shortness of breath or chest discomfort ischemic workup could be done with Myoview versus cardiac CTA  Essential hypertension Blood pressure is well controlled on today's visit. No changes made to the medications.  Asthma/COPD  likely contributing to mild  shortness of breath Symptoms stable    Signed, Dossie Arbour, M.D., Ph.D. [facility] [place], [place] [phone]

## 2023-05-10 ENCOUNTER — Encounter: Payer: Self-pay | Admitting: Cardiovascular Disease

## 2023-05-10 ENCOUNTER — Ambulatory Visit: Payer: 59 | Attending: Cardiovascular Disease | Admitting: Cardiovascular Disease

## 2023-05-10 VITALS — BP 102/56 | HR 101 | Ht 63.0 in | Wt 112.0 lb

## 2023-05-10 DIAGNOSIS — J449 Chronic obstructive pulmonary disease, unspecified: Secondary | ICD-10-CM

## 2023-05-10 DIAGNOSIS — I10 Essential (primary) hypertension: Secondary | ICD-10-CM | POA: Diagnosis not present

## 2023-05-10 DIAGNOSIS — R0602 Shortness of breath: Secondary | ICD-10-CM

## 2023-05-10 NOTE — Patient Instructions (Signed)

## 2023-05-13 ENCOUNTER — Ambulatory Visit: Payer: 59 | Attending: Infectious Diseases | Admitting: Infectious Diseases

## 2023-05-13 ENCOUNTER — Telehealth: Payer: Self-pay

## 2023-05-13 DIAGNOSIS — T8463XA Infection and inflammatory reaction due to internal fixation device of spine, initial encounter: Secondary | ICD-10-CM | POA: Insufficient documentation

## 2023-05-13 DIAGNOSIS — T8149XA Infection following a procedure, other surgical site, initial encounter: Secondary | ICD-10-CM

## 2023-05-13 DIAGNOSIS — Z8739 Personal history of other diseases of the musculoskeletal system and connective tissue: Secondary | ICD-10-CM

## 2023-05-13 DIAGNOSIS — Z9181 History of falling: Secondary | ICD-10-CM | POA: Diagnosis not present

## 2023-05-13 DIAGNOSIS — I10 Essential (primary) hypertension: Secondary | ICD-10-CM | POA: Diagnosis not present

## 2023-05-13 DIAGNOSIS — Y838 Other surgical procedures as the cause of abnormal reaction of the patient, or of later complication, without mention of misadventure at the time of the procedure: Secondary | ICD-10-CM | POA: Insufficient documentation

## 2023-05-13 DIAGNOSIS — J449 Chronic obstructive pulmonary disease, unspecified: Secondary | ICD-10-CM | POA: Diagnosis not present

## 2023-05-13 DIAGNOSIS — Z981 Arthrodesis status: Secondary | ICD-10-CM | POA: Insufficient documentation

## 2023-05-13 DIAGNOSIS — F419 Anxiety disorder, unspecified: Secondary | ICD-10-CM | POA: Diagnosis not present

## 2023-05-13 DIAGNOSIS — R7881 Bacteremia: Secondary | ICD-10-CM | POA: Diagnosis present

## 2023-05-13 DIAGNOSIS — D649 Anemia, unspecified: Secondary | ICD-10-CM | POA: Insufficient documentation

## 2023-05-13 DIAGNOSIS — B9561 Methicillin susceptible Staphylococcus aureus infection as the cause of diseases classified elsewhere: Secondary | ICD-10-CM | POA: Insufficient documentation

## 2023-05-13 DIAGNOSIS — K2270 Barrett's esophagus without dysplasia: Secondary | ICD-10-CM | POA: Diagnosis not present

## 2023-05-13 DIAGNOSIS — Z79899 Other long term (current) drug therapy: Secondary | ICD-10-CM | POA: Diagnosis not present

## 2023-05-13 DIAGNOSIS — M4646 Discitis, unspecified, lumbar region: Secondary | ICD-10-CM | POA: Diagnosis not present

## 2023-05-13 DIAGNOSIS — R1310 Dysphagia, unspecified: Secondary | ICD-10-CM | POA: Diagnosis not present

## 2023-05-13 DIAGNOSIS — K6812 Psoas muscle abscess: Secondary | ICD-10-CM | POA: Diagnosis not present

## 2023-05-13 DIAGNOSIS — K449 Diaphragmatic hernia without obstruction or gangrene: Secondary | ICD-10-CM | POA: Diagnosis not present

## 2023-05-13 DIAGNOSIS — M4626 Osteomyelitis of vertebra, lumbar region: Secondary | ICD-10-CM | POA: Diagnosis not present

## 2023-05-13 DIAGNOSIS — M4622 Osteomyelitis of vertebra, cervical region: Secondary | ICD-10-CM | POA: Diagnosis not present

## 2023-05-13 DIAGNOSIS — Z8619 Personal history of other infectious and parasitic diseases: Secondary | ICD-10-CM

## 2023-05-13 NOTE — Telephone Encounter (Signed)
Received VM from NP with [facility] requesting call back from Dr. Rivka Safer. MD tried to call back, but was not able to reach her.  VM not setup.  Called SNF for additional information. Left voicemail requesting call back. Juanita Laster, RMA

## 2023-05-13 NOTE — Progress Notes (Signed)
NAME: Summer Murphy  DOB: Jun 17, 1955  MRN: [id number]  Date/Time: 05/13/2023 10:00 AM    ?The purpose of this virtual visit is to provide medical care while limiting exposure to the novel coronavirus (COVID19) for both patient and office staff.   Consent was obtained for video visit:  Yes.   Answered questions that patient had about telehealth interaction:  Yes.   I discussed the limitations, risks, security and privacy concerns of performing an evaluation and management service by telephone. I also discussed with the patient that there may be a patient responsible charge related to this service. The patient expressed understanding and agreed to proceed.   Patient Location: [facility]ation [facility] [phone]) Provider Location: office  On the call- patient , nurse at [facility] and Provider Aslo spoke to NP at [facility]  Summer Murphy is a 68 y.o. with a history of 68 y.o. with a history of cervical myelpathy for which she  underwent c3-c6 laminectomy and posterior fusion on 03/02/23, COPD, barrettts esophagus, hiatus hernia, HTN, was admitted to [facility] between  after a fall at home. She had a fever of 101.4 in the ED MRI of the cervical spine  showed collection both deep at the laminectomy bed and superficial at the site of surgical incision site Blood culture came back positive for MSSA and she was started on cefazolin Pt was seen by neurosurgeon  and taken to the OR  and underwent irrigation and debridement.,  Culture was MSSA. Pt was also c/o b/l groin pain and leg pain and had MRI of the lumbar spine on 04/25/23 and it showed discitis-osteomyelitis at L3-L4 and L4-L5. Small amount of enhancing fluid in the anterior epidural space between the L3 and L4 levels with suspected epidural abscess in the left lateral recess at the level of the L4 vertebral body. 2. Bilateral psoas muscle abscesses. She could not tolerate Po rifampin because of severe upper GI symptoms She was sent to SNF on 2/4 with 6  weeks of cefazolin to finish on 06/07/23  Pt is doing better No fever or chills Working with PT Says the SNF is trying to discharge her next week- she cannot do IV antibiotic at home as she has no help  Past Medical History:  Diagnosis Date   Acid reflux    Anemia    Anxiety    a.) buspirone BID + BZO PRN (clonazepam)   Arthritis    Asthma    Cervical disc disorder with radiculopathy of cervical region    Chronic pain syndrome    a.) on COT as prescribed by pain mangement   Chronic, continuous use of opioids    a.) followed by pain mangement   COPD (chronic obstructive pulmonary disease) (HCC)    DOE (dyspnea on exertion)    Hematuria    Hiatal hernia    History of kidney stones    Hypertension    Hypoglycemia    Insomnia    a.) uses Trazodone PRN   LBBB (left bundle branch block)    Migraine    Murmur    Osteopenia    Palpitations    Pre-diabetes    Restless leg    a.) on ropinirole   Stenosis of cervical spine with myelopathy (HCC)    T2DM (type 2 diabetes mellitus) (HCC)     Past Surgical History:  Procedure Laterality Date   ABDOMINAL HYSTERECTOMY     AMPUTATION TOE Left 07/31/2016   Procedure: AMPUTATION TOE/MPJ 2nd toe;  Surgeon:  Linus Galas, DPM;  Location: [facility] ORS;  Service: Podiatry;  Laterality: Left;   APPENDECTOMY  1990   BACK SURGERY     low back   BREAST SURGERY     bilateral breast reduction   CARDIAC ELECTROPHYSIOLOGY STUDY AND ABLATION     CERVICAL WOUND DEBRIDEMENT N/A 04/17/2023   Procedure: CERVICAL WOUND DEBRIDEMENT;  Surgeon: Lucy Chris, MD;  Location: [facility];  Service: Neurosurgery;  Laterality: N/A;   CHOLECYSTECTOMY  1990   COLONOSCOPY WITH PROPOFOL N/A 01/04/2017   Procedure: COLONOSCOPY WITH PROPOFOL;  Surgeon: Scot Jun, MD;  Location: [facility] ENDOSCOPY;  Service: Endoscopy;  Laterality: N/A;   CORNEAL TRANSPLANT     ESOPHAGOGASTRODUODENOSCOPY N/A 04/09/2021   Procedure: ESOPHAGOGASTRODUODENOSCOPY (EGD);  Surgeon: Toney Reil, MD;  Location: [facility] ENDOSCOPY;  Service: Gastroenterology;  Laterality: N/A;   ESOPHAGOGASTRODUODENOSCOPY (EGD) WITH PROPOFOL N/A 01/04/2017   Procedure: ESOPHAGOGASTRODUODENOSCOPY (EGD) WITH PROPOFOL;  Surgeon: Scot Jun, MD;  Location: [facility] ENDOSCOPY;  Service: Endoscopy;  Laterality: N/A;   EXCISION BONE CYST Left 07/31/2016   Procedure: EXCISION BONE CYST/exostectomy 28124/left 2nd;  Surgeon: Linus Galas, DPM;  Location: [facility] ORS;  Service: Podiatry;  Laterality: Left;   EXTRACORPOREAL SHOCK WAVE LITHOTRIPSY Left 09/12/2015   Procedure: EXTRACORPOREAL SHOCK WAVE LITHOTRIPSY (ESWL);  Surgeon: Vanna Scotland, MD;  Location: [facility] ORS;  Service: Urology;  Laterality: Left;   FRACTURE SURGERY     left foot   GUM SURGERY Left    gum infection   HARDWARE REMOVAL Left 11/14/2020   Procedure: HARDWARE REMOVAL;  Surgeon: Kennedy Bucker, MD;  Location: [facility] ORS;  Service: Orthopedics;  Laterality: Left;   HH repair     Fundoplication   JOINT REPLACEMENT Bilateral 2013,2014   total knees   LAPAROSCOPIC HYSTERECTOMY     LITHOTRIPSY     periprosthetic supracondylar fracture of left femur  02/16/2020   [facility]   POSTERIOR CERVICAL FUSION/FORAMINOTOMY N/A 03/02/2023   Procedure: C3-6 POSTERIOR CERVICAL LAMINECTOMY AND FUSION;  Surgeon: Lovenia Kim, MD;  Location: [facility] ORS;  Service: Neurosurgery;  Laterality: N/A;   TONSILLECTOMY     TOTAL KNEE REVISION Right 11/14/2019   Procedure: Revision patella and tibial polyethylene;  Surgeon: Kennedy Bucker, MD;  Location: [facility] ORS;  Service: Orthopedics;  Laterality: Right;   URETEROSCOPY      Social History   Socioeconomic History   Marital status: Widowed    Spouse name: Not on file   Number of children: Not on file   Years of education: Not on file   Highest education level: Not on file  Occupational History   Not on file  Tobacco Use   Smoking status: Never    Passive exposure: Past   Smokeless tobacco: Never   Vaping Use   Vaping status: Never Used  Substance and Sexual Activity   Alcohol use: No   Drug use: No   Sexual activity: Not Currently  Other Topics Concern   Not on file  Social History Narrative   Lives at home alone. Relatives are the support person.    Social Drivers of Corporate investment banker Strain: Not on file  Food Insecurity: No Food Insecurity (04/18/2023)   Hunger Vital Sign    Worried About Running Out of Food in the Last Year: Never true    Ran Out of Food in the Last Year: Never true  Transportation Needs: No Transportation Needs (04/18/2023)   PRAPARE - Administrator, Civil Service (Medical): No  Lack of Transportation (Non-Medical): No  Physical Activity: Not on file  Stress: Not on file  Social Connections: Moderately Isolated (04/16/2023)   Social Connection and Isolation Panel [NHANES]    Frequency of Communication with Friends and Family: More than three times a week    Frequency of Social Gatherings with Friends and Family: Once a week    Attends Religious Services: More than 4 times per year    Active Member of [employer] or Organizations: No    Attends Banker Meetings: Never    Marital Status: Widowed  Intimate Partner Violence: Not At Risk (04/16/2023)   Humiliation, Afraid, Rape, and Kick questionnaire    Fear of Current or Ex-Partner: No    Emotionally Abused: No    Physically Abused: No    Sexually Abused: No    Family History  Problem Relation Age of Onset   Hypertension Mother    Stroke Mother    Prostate cancer Father    Kidney Stones Father    Diabetes Brother    Hypertension Brother    Breast cancer Maternal Aunt    Kidney disease Neg Hx    Allergies  Allergen Reactions   Librium [Chlordiazepoxide] Shortness Of Breath   Reglan [Metoclopramide] Hives and Other (See Comments)    hallucinations    Vanilla Shortness Of Breath    Pt reports allergy to vanilla extract only   Aspirin Hives   Nsaids Rash     Rash/flares asthma issues.   Azithromycin Other (See Comments)    Unsure of what the reaction was.   Buprenorphine Hcl Other (See Comments)    Unsure of what the reaction was.    Flexeril [Cyclobenzaprine]     hallucinations   Morphine Other (See Comments)    Unsure of reaction   Sulfa Antibiotics     Other reaction(s): Unknown   Tolmetin     Other Reaction: Allergy   Amlodipine Besylate Itching and Rash    arms, stomach and forehead   Iron Nausea And Vomiting   I? Current Outpatient Medications  Medication Sig Dispense Refill   acetaminophen (TYLENOL) 500 MG tablet Take 2 tablets (1,000 mg total) by mouth every 6 (six) hours as needed. 100 tablet 2   alendronate (FOSAMAX) 70 MG tablet Take 1 tablet (70 mg total) by mouth once a week. Take with a full glass of water on an empty stomach. 12 tablet 3   apixaban (ELIQUIS) 5 MG TABS tablet Take 5 mg by mouth 2 (two) times daily.     busPIRone (BUSPAR) 10 MG tablet Take 1 tablet (10 mg total) by mouth 2 (two) times daily. 60 tablet 2   ceFAZolin (ANCEF) IVPB Inject 2 g into the vein every 8 (eight) hours. Indication:  MSSA bacteremia and surgical site infection Last Day of Therapy:  05/28/2023 Labs - Once weekly:  CBC/D, CMP, and ESR and CRP Method of administration: IV Push. Method of administration may be changed at the discretion of facility and its pharmacy. Fax weekly lab results  promptly to [phone]. Please pull PIC at completion of IV antibiotics. Call [phone] with any critical value or questions. 115 Units 0   clonazePAM (KLONOPIN) 1 MG tablet Take 0.5-1 tablets (0.5-1 mg total) by mouth 2 (two) times daily as needed for anxiety. 60 tablet 0   diclofenac Sodium (VOLTAREN) 1 % GEL Apply 4 g topically 4 (four) times daily. (Patient taking differently: Apply 4 g topically 4 (four) times daily as needed (pain).)  350 g 1   DULoxetine (CYMBALTA) 60 MG capsule TAKE 1 CAPSULE(60 MG) BY MOUTH AT BEDTIME 90 capsule 1   Fe  Fum-Vit C-Vit B12-FA (TRIGELS-F FORTE) CAPS capsule Take 1 capsule by mouth 2 (two) times daily.     fluticasone-salmeterol (WIXELA INHUB) 250-50 MCG/ACT AEPB Inhale 1 puff into the lungs in the morning and at bedtime. 60 each 12   gabapentin (NEURONTIN) 300 MG capsule TAKE 1 CAPSULE(300 MG) BY MOUTH TWICE DAILY 60 capsule 2   ipratropium-albuterol (DUONEB) 0.5-2.5 (3) MG/3ML SOLN Take 3 mLs by nebulization every 6 (six) hours as needed.     methocarbamol (ROBAXIN) 750 MG tablet Take 1 tablet (750 mg total) by mouth every 8 (eight) hours as needed for muscle spasms.     montelukast (SINGULAIR) 10 MG tablet TAKE 1 TABLET(10 MG) BY MOUTH AT BEDTIME 90 tablet 1   ondansetron (ZOFRAN) 4 MG tablet TAKE 1 TABLET BY MOUTH TWICE DAILY AS NEEDED 45 tablet 1   pantoprazole (PROTONIX) 40 MG tablet Take 40 mg by mouth 2 (two) times daily.     polyethylene glycol (MIRALAX / GLYCOLAX) 17 g packet Take 17 g by mouth 2 (two) times daily.     rOPINIRole (REQUIP) 1 MG tablet Take 1 tablet (1 mg total) by mouth at bedtime.     senna (SENOKOT) 8.6 MG TABS tablet Take 1 tablet (8.6 mg total) by mouth 2 (two) times daily as needed for mild constipation. 30 tablet 0   SUMAtriptan (IMITREX) 100 MG tablet Take 1 tablet (100 mg total) by mouth every 2 (two) hours as needed (for migraine headaches.). May repeat in 1 hours if headache persists or recurs. 10 tablet 3   tirzepatide (MOUNJARO) 2.5 MG/0.5ML Pen Inject 2.5 mg into the skin once a week. 6 mL 2   traZODone (DESYREL) 150 MG tablet TAKE 1 TABLET(150 MG) BY MOUTH AT BEDTIME 30 tablet 2   No current facility-administered medications for this visit.     Abtx:  Anti-infectives (From admission, onward)    None       REVIEW OF SYSTEMS:  Const: negative fever, negative chills, negative weight loss Eyes: negative diplopia or visual changes, negative eye pain ENT: negative coryza, negative sore throat Resp: negative cough, hemoptysis, dyspnea Cards: negative for  chest pain, palpitations, lower extremity edema GU: negative for frequency, dysuria and hematuria GI: Negative for abdominal pain, diarrhea, bleeding, constipation Skin: negative for rash and pruritus Heme: negative for easy bruising and gum/nose bleeding MS:  weakness Neurolo:negative for headaches, dizziness, vertigo, memory problems  Psych:  anxiety, depression  Endocrine: negative for thyroid, diabetes Allergy/Immunology- as above: Objective:  VITALS:  There were no vitals taken for this visit. LDA PICC PHYSICAL EXAM:  General: Alert, cooperative, no distress, appears stated age.  Pertinent Labs  From 2/11- cr 0.35 Hb 7.7 ? Impression/Recommendation Recent c3-c6 laminectomy and posterior fusion on 03/02/23? MSSA bacteremia  due to cervical spine surgical site infection MRI showed superficial and deep abscess- underwent I/D and culture is MSSA Repeat blood culture from 1/26  and 2/2 neg so far Pt is on IV cefazoin for total of 6 weeks until 3/17 She will need PO cefadroxil following that for another 6 weeks because of hardware  Because of hardware added rifampin 300mg  Po BID but she was not able to to tolerate the rifampin as she had severe nausea and poor appetite.  Hence it was discontinued.   Newly diagnosed L4 and L4-L5 discitis and osteomyelitis - underwent  aspiration of the collection but culture neg    Anemia   Hiatus hernia h/o fundoplication Dysphagia has had esophageal dilatation Nov 2024 Biopsy no evidence of metaplasia ? Anxiety  on meds  Discussed with Faith NP at [facility] and told her she cannot do IV antibiotic at home and need to stay in SNF to complete IV antibiotics She will check and get back to me After completion of IV pt will have to go on cefadroxil 500mg  PO BID for 6 more weeks I will see her in April  ?Total time spent on this visit was 30 minutes  Note:  This document was prepared using Dragon voice recognition software and may include  unintentional dictation errors.

## 2023-05-14 ENCOUNTER — Telehealth: Payer: Self-pay | Admitting: Infectious Diseases

## 2023-05-14 ENCOUNTER — Encounter: Payer: Self-pay | Admitting: Infectious Diseases

## 2023-05-14 NOTE — Progress Notes (Signed)
OPAT orders for short stay/Day surgery at [facility]  Diagnosis: MSSA bacteremia MSSA cervical spine infection at the site of hardware Lumbar discitis Baseline Creatinine <1    Allergies  Allergen Reactions   Librium [Chlordiazepoxide] Shortness Of Breath   Reglan [Metoclopramide] Hives and Other (See Comments)    hallucinations    Vanilla Shortness Of Breath    Pt reports allergy to vanilla extract only   Aspirin Hives   Nsaids Rash    Rash/flares asthma issues.   Azithromycin Other (See Comments)    Unsure of what the reaction was.   Buprenorphine Hcl Other (See Comments)    Unsure of what the reaction was.    Flexeril [Cyclobenzaprine]     hallucinations   Morphine Other (See Comments)    Unsure of reaction   Sulfa Antibiotics     Other reaction(s): Unknown   Tolmetin     Other Reaction: Allergy   Amlodipine Besylate Itching and Rash    arms, stomach and forehead   Iron Nausea And Vomiting    OPAT Orders Dalbavancin IVPB 1500mg  on 05/18/23  Dalbavancin  1000mg  IVPB on 05/25/23  Dalbavancin 1000mg  IVPB on 06/01/23  This has to be given thru a peripheral line Ifpatient comes with PICC please remove it    Labs weekly while on IV antibiotics: _X_ CBC with differential  _X_ CMP _X_ CRP _X_ ESR   Fax weekly lab results  promptly to [phone]  Clinic Follow Up Appt: 06/08/23 at 11 Am Pt will need cefadroxil 1 gram PO BID for 6 weeks starting on 06/08/23   Call [phone] with any questions

## 2023-05-14 NOTE — Telephone Encounter (Signed)
Got a call from Faith NP at [facility] who is taking vcare of this patient- Her 21 day stay is coming to an end on 2/25 and pt will be discharged- She is on IV cefazolin 6 weeks until 3/17 because of cervical spine hardware , Infection, MSSA bacteremia and lumbar discitis. Pt is unable to do Iv at home by herself I spoke to [facility] , who spoke to [facility]- They will contact her family member and ask if they can help with  continuous 6grams Q 24 infusion.

## 2023-05-18 ENCOUNTER — Ambulatory Visit
Admission: RE | Admit: 2023-05-18 | Discharge: 2023-05-18 | Disposition: A | Discharge status: Home / Self Care | Payer: 59 | Source: Ambulatory Visit | Attending: Infectious Diseases | Admitting: Infectious Diseases

## 2023-05-18 VITALS — BP 120/73 | HR 107 | Temp 98.8°F | Resp 16

## 2023-05-18 DIAGNOSIS — M4646 Discitis, unspecified, lumbar region: Secondary | ICD-10-CM | POA: Diagnosis present

## 2023-05-18 DIAGNOSIS — B9561 Methicillin susceptible Staphylococcus aureus infection as the cause of diseases classified elsewhere: Secondary | ICD-10-CM | POA: Diagnosis present

## 2023-05-18 DIAGNOSIS — R7881 Bacteremia: Secondary | ICD-10-CM | POA: Diagnosis present

## 2023-05-18 DIAGNOSIS — M4622 Osteomyelitis of vertebra, cervical region: Secondary | ICD-10-CM | POA: Diagnosis not present

## 2023-05-18 DIAGNOSIS — E878 Other disorders of electrolyte and fluid balance, not elsewhere classified: Secondary | ICD-10-CM | POA: Insufficient documentation

## 2023-05-18 DIAGNOSIS — T8149XA Infection following a procedure, other surgical site, initial encounter: Secondary | ICD-10-CM | POA: Diagnosis not present

## 2023-05-18 DIAGNOSIS — M4620 Osteomyelitis of vertebra, site unspecified: Secondary | ICD-10-CM

## 2023-05-18 LAB — COMPREHENSIVE METABOLIC PANEL
ALT: 6 U/L (ref 0–44)
AST: 18 U/L (ref 15–41)
Albumin: 2.2 g/dL — ABNORMAL LOW (ref 3.5–5.0)
Alkaline Phosphatase: 105 U/L (ref 38–126)
Anion gap: 6 (ref 5–15)
BUN: 15 mg/dL (ref 8–23)
CO2: 30 mmol/L (ref 22–32)
Calcium: 8.1 mg/dL — ABNORMAL LOW (ref 8.9–10.3)
Chloride: 97 mmol/L — ABNORMAL LOW (ref 98–111)
Creatinine, Ser: 0.45 mg/dL (ref 0.44–1.00)
GFR, Estimated: >60 mL/min (ref >60)
Glucose, Bld: 139 mg/dL — ABNORMAL HIGH (ref 70–99)
Potassium: 3.5 mmol/L (ref 3.5–5.1)
Sodium: 133 mmol/L — ABNORMAL LOW (ref 135–145)
Total Bilirubin: 0.2 mg/dL (ref 0.0–1.2)
Total Protein: 6.2 g/dL — ABNORMAL LOW (ref 6.5–8.1)

## 2023-05-18 LAB — CBC
HCT: 25.5 % — ABNORMAL LOW (ref 36.0–46.0)
Hemoglobin: 7.9 g/dL — ABNORMAL LOW (ref 12.0–15.0)
MCH: 23.7 pg — ABNORMAL LOW (ref 26.0–34.0)
MCHC: 31 g/dL (ref 30.0–36.0)
MCV: 76.6 fL — ABNORMAL LOW (ref 80.0–100.0)
Platelets: 435 10*3/uL — ABNORMAL HIGH (ref 150–400)
RBC: 3.33 MIL/uL — ABNORMAL LOW (ref 3.87–5.11)
RDW: 22.6 % — ABNORMAL HIGH (ref 11.5–15.5)
WBC: 8.5 10*3/uL (ref 4.0–10.5)
nRBC: 0 % (ref 0.0–0.2)

## 2023-05-18 LAB — C-REACTIVE PROTEIN: CRP: 1 mg/dL — ABNORMAL HIGH (ref <1.0)

## 2023-05-18 LAB — SEDIMENTATION RATE: Sed Rate: 63 mm/h — ABNORMAL HIGH (ref 0–30)

## 2023-05-18 MED ORDER — HEPARIN SOD (PORK) LOCK FLUSH 100 UNIT/ML IV SOLN
500.0000 [IU] | Freq: Once | INTRAVENOUS | Status: AC
  Administered 2023-05-18: 500 [IU] via INTRAVENOUS

## 2023-05-18 MED ORDER — HEPARIN SOD (PORK) LOCK FLUSH 100 UNIT/ML IV SOLN
INTRAVENOUS | Status: AC
  Filled 2023-05-18: qty 5

## 2023-05-18 MED ORDER — DEXTROSE 5 % IV SOLN
1500.0000 mg | Freq: Once | INTRAVENOUS | Status: AC
  Administered 2023-05-18: 1500 mg via INTRAVENOUS
  Filled 2023-05-18: qty 75

## 2023-05-19 ENCOUNTER — Encounter: Payer: Self-pay | Admitting: Oncology

## 2023-05-24 ENCOUNTER — Telehealth: Payer: Self-pay

## 2023-05-24 ENCOUNTER — Encounter: Payer: 59 | Admitting: Physician Assistant

## 2023-05-24 ENCOUNTER — Encounter: Payer: 59 | Admitting: Neurosurgery

## 2023-05-24 ENCOUNTER — Encounter: Payer: Self-pay | Admitting: Nurse Practitioner

## 2023-05-24 ENCOUNTER — Ambulatory Visit: Payer: 59 | Admitting: Nurse Practitioner

## 2023-05-24 VITALS — BP 125/76 | HR 100 | Temp 98.5°F | Resp 16 | Ht 63.0 in | Wt 108.0 lb

## 2023-05-24 DIAGNOSIS — G2581 Restless legs syndrome: Secondary | ICD-10-CM | POA: Diagnosis not present

## 2023-05-24 DIAGNOSIS — M4802 Spinal stenosis, cervical region: Secondary | ICD-10-CM | POA: Diagnosis not present

## 2023-05-24 DIAGNOSIS — K2100 Gastro-esophageal reflux disease with esophagitis, without bleeding: Secondary | ICD-10-CM

## 2023-05-24 DIAGNOSIS — F3342 Major depressive disorder, recurrent, in full remission: Secondary | ICD-10-CM

## 2023-05-24 DIAGNOSIS — M064 Inflammatory polyarthropathy: Secondary | ICD-10-CM

## 2023-05-24 DIAGNOSIS — E119 Type 2 diabetes mellitus without complications: Secondary | ICD-10-CM

## 2023-05-24 DIAGNOSIS — M5412 Radiculopathy, cervical region: Secondary | ICD-10-CM | POA: Diagnosis not present

## 2023-05-24 DIAGNOSIS — Z79899 Other long term (current) drug therapy: Secondary | ICD-10-CM

## 2023-05-24 DIAGNOSIS — F418 Other specified anxiety disorders: Secondary | ICD-10-CM

## 2023-05-24 DIAGNOSIS — J301 Allergic rhinitis due to pollen: Secondary | ICD-10-CM

## 2023-05-24 DIAGNOSIS — R112 Nausea with vomiting, unspecified: Secondary | ICD-10-CM

## 2023-05-24 DIAGNOSIS — K449 Diaphragmatic hernia without obstruction or gangrene: Secondary | ICD-10-CM

## 2023-05-24 MED ORDER — TRAZODONE HCL 150 MG PO TABS: ORAL_TABLET | ORAL | 2 refills | Status: DC

## 2023-05-24 MED ORDER — ONDANSETRON HCL 4 MG PO TABS: ORAL_TABLET | ORAL | 1 refills | Status: DC

## 2023-05-24 MED ORDER — DULOXETINE HCL 60 MG PO CPEP: 60.0000 mg | ORAL_CAPSULE | Freq: Every day | ORAL | 1 refills | Status: DC

## 2023-05-24 MED ORDER — ROPINIROLE HCL 1 MG PO TABS
1.0000 mg | ORAL_TABLET | Freq: Every day | ORAL | 1 refills | Status: DC
Start: 2023-05-24 — End: 2023-11-17

## 2023-05-24 MED ORDER — HYDROCODONE-ACETAMINOPHEN 5-325 MG PO TABS: 1.0000 | ORAL_TABLET | Freq: Four times a day (QID) | ORAL | 0 refills | Status: DC | PRN

## 2023-05-24 MED ORDER — GABAPENTIN 300 MG PO CAPS: 300.0000 mg | ORAL_CAPSULE | Freq: Two times a day (BID) | ORAL | 2 refills | Status: DC

## 2023-05-24 MED ORDER — MONTELUKAST SODIUM 10 MG PO TABS: 10.0000 mg | ORAL_TABLET | Freq: Every day | ORAL | 1 refills | Status: DC

## 2023-05-24 MED ORDER — BUSPIRONE HCL 10 MG PO TABS: 10.0000 mg | ORAL_TABLET | Freq: Two times a day (BID) | ORAL | 2 refills | Status: DC

## 2023-05-24 MED ORDER — CLONAZEPAM 1 MG PO TABS: 0.5000 mg | ORAL_TABLET | Freq: Two times a day (BID) | ORAL | 0 refills | Status: DC | PRN

## 2023-05-24 MED ORDER — METHOCARBAMOL 750 MG PO TABS: 750.0000 mg | ORAL_TABLET | Freq: Three times a day (TID) | ORAL | 5 refills | Status: DC | PRN

## 2023-05-24 MED ORDER — PANTOPRAZOLE SODIUM 40 MG PO TBEC: 40.0000 mg | DELAYED_RELEASE_TABLET | Freq: Two times a day (BID) | ORAL | 1 refills | Status: DC

## 2023-05-24 NOTE — Telephone Encounter (Signed)
 Gave verbal order to [facility] occupational therapy [phone] once a week for 4 weeks and 1 times a week for every other week

## 2023-05-24 NOTE — Progress Notes (Signed)
 [facility] [address] [place], [place] [address]  Internal MEDICINE  Office Visit Note  Patient Name: Summer Murphy  [id number]  [id number]  Date of Service: 05/24/2023  Chief Complaint  Patient presents with   Diabetes   Gastroesophageal Reflux   Hypertension   Follow-up    D/c from rehab    HPI Summer Murphy presents for a follow-up visit for recent infection, weight loss, GERD, chronic neck pain and anxiety and depression.  Admitted to hospital for sepsis and abscess, now discharged from rehab and is getting IV antibiotics once a week still via a PICC line  Significant weight loss from hospital stay -- discontinued ozempic injections.  GERD with hiatal hernia -- takes pantoprazole and also has zofran as needed for nausea.  Chronic neck pain with foraminal stenosis, s/p cervical fusion and subsequent infection -- still having neck pain, currently on medications to help with the pain.  Anxiety and depression -- taking buspirone and duloxetine    Current Medication: Outpatient Encounter Medications as of 05/24/2023  Medication Sig Note   acetaminophen (TYLENOL) 500 MG tablet Take 2 tablets (1,000 mg total) by mouth every 6 (six) hours as needed. 04/22/2023: prn   alendronate (FOSAMAX) 70 MG tablet Take 1 tablet (70 mg total) by mouth once a week. Take with a full glass of water on an empty stomach.    apixaban (ELIQUIS) 5 MG TABS tablet Take 5 mg by mouth 2 (two) times daily.    ceFAZolin (ANCEF) IVPB Inject 2 g into the vein every 8 (eight) hours. Indication:  MSSA bacteremia and surgical site infection Last Day of Therapy:  05/28/2023 Labs - Once weekly:  CBC/D, CMP, and ESR and CRP Method of administration: IV Push. Method of administration may be changed at the discretion of facility and its pharmacy. Fax weekly lab results  promptly to [phone]. Please pull PIC at completion of IV antibiotics. Call [phone] with any critical value or questions.     diclofenac Sodium (VOLTAREN) 1 % GEL Apply 4 g topically 4 (four) times daily. (Patient taking differently: Apply 4 g topically 4 (four) times daily as needed (pain).) 04/22/2023: prn   Fe Fum-Vit C-Vit B12-FA (TRIGELS-F FORTE) CAPS capsule Take 1 capsule by mouth 2 (two) times daily.    fluticasone-salmeterol (WIXELA INHUB) 250-50 MCG/ACT AEPB Inhale 1 puff into the lungs in the morning and at bedtime.    ipratropium-albuterol (DUONEB) 0.5-2.5 (3) MG/3ML SOLN Take 3 mLs by nebulization every 6 (six) hours as needed.    polyethylene glycol (MIRALAX / GLYCOLAX) 17 g packet Take 17 g by mouth 2 (two) times daily.    senna (SENOKOT) 8.6 MG TABS tablet Take 1 tablet (8.6 mg total) by mouth 2 (two) times daily as needed for mild constipation. 04/22/2023: prn   SUMAtriptan (IMITREX) 100 MG tablet Take 1 tablet (100 mg total) by mouth every 2 (two) hours as needed (for migraine headaches.). May repeat in 1 hours if headache persists or recurs.    [DISCONTINUED] busPIRone (BUSPAR) 10 MG tablet Take 1 tablet (10 mg total) by mouth 2 (two) times daily.    [DISCONTINUED] clonazePAM (KLONOPIN) 1 MG tablet Take 0.5-1 tablets (0.5-1 mg total) by mouth 2 (two) times daily as needed for anxiety. 04/22/2023: prn   [DISCONTINUED] DULoxetine (CYMBALTA) 60 MG capsule TAKE 1 CAPSULE(60 MG) BY MOUTH AT BEDTIME    [DISCONTINUED] gabapentin (NEURONTIN) 300 MG capsule TAKE 1 CAPSULE(300 MG) BY MOUTH TWICE DAILY    [DISCONTINUED] methocarbamol (  ROBAXIN) 750 MG tablet Take 1 tablet (750 mg total) by mouth every 8 (eight) hours as needed for muscle spasms.    [DISCONTINUED] montelukast (SINGULAIR) 10 MG tablet TAKE 1 TABLET(10 MG) BY MOUTH AT BEDTIME    [DISCONTINUED] ondansetron (ZOFRAN) 4 MG tablet TAKE 1 TABLET BY MOUTH TWICE DAILY AS NEEDED 04/22/2023: prn   [DISCONTINUED] pantoprazole (PROTONIX) 40 MG tablet Take 40 mg by mouth 2 (two) times daily.    [DISCONTINUED] rOPINIRole (REQUIP) 1 MG tablet Take 1 tablet (1 mg total) by  mouth at bedtime.    [DISCONTINUED] tirzepatide (MOUNJARO) 2.5 MG/0.5ML Pen Inject 2.5 mg into the skin once a week.    [DISCONTINUED] traZODone (DESYREL) 150 MG tablet TAKE 1 TABLET(150 MG) BY MOUTH AT BEDTIME    busPIRone (BUSPAR) 10 MG tablet Take 1 tablet (10 mg total) by mouth 2 (two) times daily.    clonazePAM (KLONOPIN) 1 MG tablet Take 0.5-1 tablets (0.5-1 mg total) by mouth 2 (two) times daily as needed for anxiety.    DULoxetine (CYMBALTA) 60 MG capsule Take 1 capsule (60 mg total) by mouth at bedtime.    gabapentin (NEURONTIN) 300 MG capsule Take 1 capsule (300 mg total) by mouth 2 (two) times daily.    HYDROcodone-acetaminophen (NORCO/VICODIN) 5-325 MG tablet Take 1 tablet by mouth every 6 (six) hours as needed for up to 7 days for severe pain (pain score 7-10).    methocarbamol (ROBAXIN) 750 MG tablet Take 1 tablet (750 mg total) by mouth every 8 (eight) hours as needed for muscle spasms.    montelukast (SINGULAIR) 10 MG tablet Take 1 tablet (10 mg total) by mouth at bedtime.    ondansetron (ZOFRAN) 4 MG tablet TAKE 1 TABLET BY MOUTH TWICE DAILY AS NEEDED    pantoprazole (PROTONIX) 40 MG tablet Take 1 tablet (40 mg total) by mouth 2 (two) times daily.    rOPINIRole (REQUIP) 1 MG tablet Take 1 tablet (1 mg total) by mouth at bedtime.    traZODone (DESYREL) 150 MG tablet TAKE 1 TABLET(150 MG) BY MOUTH AT BEDTIME    No facility-administered encounter medications on file as of 05/24/2023.    Surgical History: Past Surgical History:  Procedure Laterality Date   ABDOMINAL HYSTERECTOMY     AMPUTATION TOE Left 07/31/2016   Procedure: AMPUTATION TOE/MPJ 2nd toe;  Surgeon: Angel Barba, DPM;  Location: [facility];  Service: Podiatry;  Laterality: Left;   APPENDECTOMY  1990   BACK SURGERY     low back   BREAST SURGERY     bilateral breast reduction   CARDIAC ELECTROPHYSIOLOGY STUDY AND ABLATION     CERVICAL WOUND DEBRIDEMENT N/A 04/17/2023   Procedure: CERVICAL WOUND DEBRIDEMENT;  Surgeon:  Berta Brittle, MD;  Location: [facility];  Service: Neurosurgery;  Laterality: N/A;   CHOLECYSTECTOMY  1990   COLONOSCOPY WITH PROPOFOL N/A 01/04/2017   Procedure: COLONOSCOPY WITH PROPOFOL;  Surgeon: Cassie Click, MD;  Location: [facility] ENDOSCOPY;  Service: Endoscopy;  Laterality: N/A;   CORNEAL TRANSPLANT     ESOPHAGOGASTRODUODENOSCOPY N/A 04/09/2021   Procedure: ESOPHAGOGASTRODUODENOSCOPY (EGD);  Surgeon: Selena Daily, MD;  Location: [facility] ENDOSCOPY;  Service: Gastroenterology;  Laterality: N/A;   ESOPHAGOGASTRODUODENOSCOPY (EGD) WITH PROPOFOL N/A 01/04/2017   Procedure: ESOPHAGOGASTRODUODENOSCOPY (EGD) WITH PROPOFOL;  Surgeon: Cassie Click, MD;  Location: [facility] ENDOSCOPY;  Service: Endoscopy;  Laterality: N/A;   EXCISION BONE CYST Left 07/31/2016   Procedure: EXCISION BONE CYST/exostectomy 28124/left 2nd;  Surgeon: Angel Barba, DPM;  Location: [facility];  Service: Podiatry;  Laterality: Left;   EXTRACORPOREAL SHOCK WAVE LITHOTRIPSY Left 09/12/2015   Procedure: EXTRACORPOREAL SHOCK WAVE LITHOTRIPSY (ESWL);  Surgeon: Dustin Gimenez, MD;  Location: [facility] ORS;  Service: Urology;  Laterality: Left;   FRACTURE SURGERY     left foot   GUM SURGERY Left    gum infection   HARDWARE REMOVAL Left 11/14/2020   Procedure: HARDWARE REMOVAL;  Surgeon: Molli Angelucci, MD;  Location: [facility];  Service: Orthopedics;  Laterality: Left;   HH repair     Fundoplication   JOINT REPLACEMENT Bilateral 2013,2014   total knees   LAPAROSCOPIC HYSTERECTOMY     LITHOTRIPSY     periprosthetic supracondylar fracture of left femur  02/16/2020   [facility]   POSTERIOR CERVICAL FUSION/FORAMINOTOMY N/A 03/02/2023   Procedure: C3-6 POSTERIOR CERVICAL LAMINECTOMY AND FUSION;  Surgeon: Carroll Clamp, MD;  Location: [facility] ORS;  Service: Neurosurgery;  Laterality: N/A;   TONSILLECTOMY     TOTAL KNEE REVISION Right 11/14/2019   Procedure: Revision patella and tibial polyethylene;  Surgeon: Molli Angelucci, MD;   Location: [facility];  Service: Orthopedics;  Laterality: Right;   URETEROSCOPY      Medical History: Past Medical History:  Diagnosis Date   Acid reflux    Anemia    Anxiety    a.) buspirone BID + BZO PRN (clonazepam)   Arthritis    Asthma    Cervical disc disorder with radiculopathy of cervical region    Chronic pain syndrome    a.) on COT as prescribed by pain mangement   Chronic, continuous use of opioids    a.) followed by pain mangement   COPD (chronic obstructive pulmonary disease) (HCC)    DOE (dyspnea on exertion)    Hematuria    Hiatal hernia    History of kidney stones    Hypertension    Hypoglycemia    Insomnia    a.) uses Trazodone PRN   LBBB (left bundle branch block)    Migraine    Murmur    Osteopenia    Palpitations    Pre-diabetes    Restless leg    a.) on ropinirole   Stenosis of cervical spine with myelopathy (HCC)    T2DM (type 2 diabetes mellitus) (HCC)     Family History: Family History  Problem Relation Age of Onset   Hypertension Mother    Stroke Mother    Prostate cancer Father    Kidney Stones Father    Diabetes Brother    Hypertension Brother    Breast cancer Maternal Aunt    Kidney disease Neg Hx     Social History   Socioeconomic History   Marital status: Widowed    Spouse name: Not on file   Number of children: Not on file   Years of education: Not on file   Highest education level: Not on file  Occupational History   Not on file  Tobacco Use   Smoking status: Never    Passive exposure: Past   Smokeless tobacco: Never  Vaping Use   Vaping status: Never Used  Substance and Sexual Activity   Alcohol use: No   Drug use: No   Sexual activity: Not Currently  Other Topics Concern   Not on file  Social History Narrative   Lives at home alone. Relatives are the support person.    Social Drivers of Corporate investment banker Strain: Not on file  Food Insecurity: No Food Insecurity (04/18/2023)   Hunger  Vital Sign     Worried About Programme researcher, broadcasting/film/video in the Last Year: Never true    Ran Out of Food in the Last Year: Never true  Transportation Needs: No Transportation Needs (04/18/2023)   PRAPARE - Administrator, Civil Service (Medical): No    Lack of Transportation (Non-Medical): No  Physical Activity: Not on file  Stress: Not on file  Social Connections: Moderately Isolated (04/16/2023)   Social Connection and Isolation Panel [NHANES]    Frequency of Communication with Friends and Family: More than three times a week    Frequency of Social Gatherings with Friends and Family: Once a week    Attends Religious Services: More than 4 times per year    Active Member of [employer] or Organizations: No    Attends Banker Meetings: Never    Marital Status: Widowed  Intimate Partner Violence: Not At Risk (04/16/2023)   Humiliation, Afraid, Rape, and Kick questionnaire    Fear of Current or Ex-Partner: No    Emotionally Abused: No    Physically Abused: No    Sexually Abused: No      Review of Systems  Constitutional:  Negative for fatigue and fever.  HENT:  Negative for congestion, mouth sores and postnasal drip.   Respiratory: Negative.  Negative for cough, chest tightness, shortness of breath and wheezing.   Cardiovascular: Negative.  Negative for chest pain and palpitations.  Genitourinary:  Negative for flank pain.  Musculoskeletal:  Positive for arthralgias, back pain and neck pain.  Neurological:        Forgetfulness  Psychiatric/Behavioral:  Positive for behavioral problems and sleep disturbance. Negative for self-injury and suicidal ideas. The patient is nervous/anxious.     Vital Signs: BP 125/76   Pulse 100   Temp 98.5 F (36.9 C)   Resp 16   Ht 5\' 3"  (1.6 m)   Wt 108 lb (49 kg)   SpO2 98%   BMI 19.13 kg/m    Physical Exam Vitals reviewed.  Constitutional:      General: She is not in acute distress.    Appearance: Normal appearance. She is normal weight. She  is not ill-appearing.  HENT:     Head: Normocephalic and atraumatic.  Eyes:     Pupils: Pupils are equal, round, and reactive to light.  Cardiovascular:     Rate and Rhythm: Normal rate and regular rhythm.  Pulmonary:     Effort: Pulmonary effort is normal. No respiratory distress.  Neurological:     Mental Status: She is alert and oriented to person, place, and time.  Psychiatric:        Mood and Affect: Mood normal.        Behavior: Behavior normal.        Assessment/Plan: 1. Cervical radicular pain (left severe) (Primary) Continue duloxetine, gabapentin and methocarbamol as prescribed.  - methocarbamol (ROBAXIN) 750 MG tablet; Take 1 tablet (750 mg total) by mouth every 8 (eight) hours as needed for muscle spasms.  Dispense: 90 tablet; Refill: 5 - gabapentin (NEURONTIN) 300 MG capsule; Take 1 capsule (300 mg total) by mouth 2 (two) times daily.  Dispense: 60 capsule; Refill: 2 - DULoxetine (CYMBALTA) 60 MG capsule; Take 1 capsule (60 mg total) by mouth at bedtime.  Dispense: 90 capsule; Refill: 1  2. Foraminal stenosis of cervical region (severe b/l C5/6; C6/7) Continue methocarbamol and gabapentin as prescribed.  - methocarbamol (ROBAXIN) 750 MG tablet; Take 1 tablet (750 mg total)  by mouth every 8 (eight) hours as needed for muscle spasms.  Dispense: 90 tablet; Refill: 5 - gabapentin (NEURONTIN) 300 MG capsule; Take 1 capsule (300 mg total) by mouth 2 (two) times daily.  Dispense: 60 capsule; Refill: 2  3. Spinal stenosis in cervical region Continue methocarbamol and gabapentin as prescribed.  - methocarbamol (ROBAXIN) 750 MG tablet; Take 1 tablet (750 mg total) by mouth every 8 (eight) hours as needed for muscle spasms.  Dispense: 90 tablet; Refill: 5 - gabapentin (NEURONTIN) 300 MG capsule; Take 1 capsule (300 mg total) by mouth 2 (two) times daily.  Dispense: 60 capsule; Refill: 2  4. Inflammatory polyarthropathy (HCC) Continue methocarbamol and gabapentin as prescribed.   - methocarbamol (ROBAXIN) 750 MG tablet; Take 1 tablet (750 mg total) by mouth every 8 (eight) hours as needed for muscle spasms.  Dispense: 90 tablet; Refill: 5 - gabapentin (NEURONTIN) 300 MG capsule; Take 1 capsule (300 mg total) by mouth 2 (two) times daily.  Dispense: 60 capsule; Refill: 2  5. Restless leg Continue ropinirole and gabapentin as prescribed.  - rOPINIRole (REQUIP) 1 MG tablet; Take 1 tablet (1 mg total) by mouth at bedtime.  Dispense: 90 tablet; Refill: 1 - gabapentin (NEURONTIN) 300 MG capsule; Take 1 capsule (300 mg total) by mouth 2 (two) times daily.  Dispense: 60 capsule; Refill: 2  6. Hiatal hernia with gastroesophageal reflux disease and esophagitis Continue pantoprazole and prn zofran as prescribed.  - pantoprazole (PROTONIX) 40 MG tablet; Take 1 tablet (40 mg total) by mouth 2 (two) times daily.  Dispense: 180 tablet; Refill: 1 - ondansetron (ZOFRAN) 4 MG tablet; TAKE 1 TABLET BY MOUTH TWICE DAILY AS NEEDED  Dispense: 45 tablet; Refill: 1  7. Seasonal allergic rhinitis due to pollen Continue montelukast as prescribed  - montelukast (SINGULAIR) 10 MG tablet; Take 1 tablet (10 mg total) by mouth at bedtime.  Dispense: 90 tablet; Refill: 1  8. Recurrent major depressive disorder, in full remission (HCC) Continue duloxetine, buspirone and trazodone as prescribed.  - DULoxetine (CYMBALTA) 60 MG capsule; Take 1 capsule (60 mg total) by mouth at bedtime.  Dispense: 90 capsule; Refill: 1 - busPIRone (BUSPAR) 10 MG tablet; Take 1 tablet (10 mg total) by mouth 2 (two) times daily.  Dispense: 60 tablet; Refill: 2 - traZODone (DESYREL) 150 MG tablet; TAKE 1 TABLET(150 MG) BY MOUTH AT BEDTIME  Dispense: 30 tablet; Refill: 2  9. Situational anxiety Continue buspirone as prescribed.  - busPIRone (BUSPAR) 10 MG tablet; Take 1 tablet (10 mg total) by mouth 2 (two) times daily.  Dispense: 60 tablet; Refill: 2   General Counseling: Sausha verbalizes understanding of the  findings of todays visit and agrees with plan of treatment. I have discussed any further diagnostic evaluation that may be needed or ordered today. We also reviewed her medications today. she has been encouraged to call the office with any questions or concerns that should arise related to todays visit.    No orders of the defined types were placed in this encounter.   Meds ordered this encounter  Medications   HYDROcodone-acetaminophen (NORCO/VICODIN) 5-325 MG tablet    Sig: Take 1 tablet by mouth every 6 (six) hours as needed for up to 7 days for severe pain (pain score 7-10).    Dispense:  28 tablet    Refill:  0    Post op pain, 7 day fill, may request refill   clonazePAM (KLONOPIN) 1 MG tablet    Sig: Take 0.5-1 tablets (  0.5-1 mg total) by mouth 2 (two) times daily as needed for anxiety.    Dispense:  60 tablet    Refill:  0    Note change in dose and number of tablets, please fill new script today.   rOPINIRole (REQUIP) 1 MG tablet    Sig: Take 1 tablet (1 mg total) by mouth at bedtime.    Dispense:  90 tablet    Refill:  1    For future refills   montelukast (SINGULAIR) 10 MG tablet    Sig: Take 1 tablet (10 mg total) by mouth at bedtime.    Dispense:  90 tablet    Refill:  1   methocarbamol (ROBAXIN) 750 MG tablet    Sig: Take 1 tablet (750 mg total) by mouth every 8 (eight) hours as needed for muscle spasms.    Dispense:  90 tablet    Refill:  5    For future refill   gabapentin (NEURONTIN) 300 MG capsule    Sig: Take 1 capsule (300 mg total) by mouth 2 (two) times daily.    Dispense:  60 capsule    Refill:  2   DULoxetine (CYMBALTA) 60 MG capsule    Sig: Take 1 capsule (60 mg total) by mouth at bedtime.    Dispense:  90 capsule    Refill:  1   busPIRone (BUSPAR) 10 MG tablet    Sig: Take 1 tablet (10 mg total) by mouth 2 (two) times daily.    Dispense:  60 tablet    Refill:  2    For future refills   traZODone (DESYREL) 150 MG tablet    Sig: TAKE 1 TABLET(150  MG) BY MOUTH AT BEDTIME    Dispense:  30 tablet    Refill:  2   pantoprazole (PROTONIX) 40 MG tablet    Sig: Take 1 tablet (40 mg total) by mouth 2 (two) times daily.    Dispense:  180 tablet    Refill:  1   ondansetron (ZOFRAN) 4 MG tablet    Sig: TAKE 1 TABLET BY MOUTH TWICE DAILY AS NEEDED    Dispense:  45 tablet    Refill:  1    Return in about 2 months (around 07/24/2023) for F/U, Shayn Madole PCP.   Total time spent:30 Minutes Time spent includes review of chart, medications, test results, and follow up plan with the patient.   [place] Controlled Substance Database was reviewed by me.  This patient was seen by Laurence Pons, FNP-C in collaboration with Dr. Verneta Gone as a part of collaborative care agreement.   Charlye Spare R. Bobbi Burow, MSN, FNP-C Internal medicine

## 2023-05-25 ENCOUNTER — Ambulatory Visit
Admission: RE | Admit: 2023-05-25 | Discharge: 2023-05-25 | Disposition: A | Discharge status: Home / Self Care | Source: Ambulatory Visit | Attending: Neurosurgery | Admitting: Neurosurgery

## 2023-05-25 ENCOUNTER — Inpatient Hospital Stay: Attending: Oncology

## 2023-05-25 ENCOUNTER — Ambulatory Visit
Admission: RE | Admit: 2023-05-25 | Discharge: 2023-05-25 | Disposition: A | Discharge status: Home / Self Care | Source: Ambulatory Visit | Attending: Infectious Diseases | Admitting: Infectious Diseases

## 2023-05-25 ENCOUNTER — Telehealth: Payer: Self-pay | Admitting: Nurse Practitioner

## 2023-05-25 ENCOUNTER — Ambulatory Visit
Admission: RE | Admit: 2023-05-25 | Discharge: 2023-05-25 | Disposition: A | Discharge status: Home / Self Care | Payer: 59 | Source: Ambulatory Visit | Attending: Infectious Diseases | Admitting: Infectious Diseases

## 2023-05-25 ENCOUNTER — Inpatient Hospital Stay: Payer: 59

## 2023-05-25 DIAGNOSIS — D509 Iron deficiency anemia, unspecified: Secondary | ICD-10-CM | POA: Diagnosis present

## 2023-05-25 DIAGNOSIS — Z981 Arthrodesis status: Secondary | ICD-10-CM

## 2023-05-25 DIAGNOSIS — E878 Other disorders of electrolyte and fluid balance, not elsewhere classified: Secondary | ICD-10-CM | POA: Diagnosis not present

## 2023-05-25 DIAGNOSIS — M4802 Spinal stenosis, cervical region: Secondary | ICD-10-CM | POA: Diagnosis present

## 2023-05-25 DIAGNOSIS — R7881 Bacteremia: Secondary | ICD-10-CM | POA: Insufficient documentation

## 2023-05-25 DIAGNOSIS — D500 Iron deficiency anemia secondary to blood loss (chronic): Secondary | ICD-10-CM | POA: Diagnosis not present

## 2023-05-25 DIAGNOSIS — G992 Myelopathy in diseases classified elsewhere: Secondary | ICD-10-CM

## 2023-05-25 DIAGNOSIS — G2581 Restless legs syndrome: Secondary | ICD-10-CM | POA: Diagnosis not present

## 2023-05-25 DIAGNOSIS — Z8719 Personal history of other diseases of the digestive system: Secondary | ICD-10-CM | POA: Diagnosis not present

## 2023-05-25 DIAGNOSIS — G894 Chronic pain syndrome: Secondary | ICD-10-CM

## 2023-05-25 DIAGNOSIS — M5030 Other cervical disc degeneration, unspecified cervical region: Secondary | ICD-10-CM | POA: Insufficient documentation

## 2023-05-25 DIAGNOSIS — Z803 Family history of malignant neoplasm of breast: Secondary | ICD-10-CM | POA: Diagnosis not present

## 2023-05-25 HISTORY — DX: Chronic pain syndrome: G89.4

## 2023-05-25 LAB — CBC WITH DIFFERENTIAL (CANCER CENTER ONLY)
Abs Immature Granulocytes: 0.03 10*3/uL (ref 0.00–0.07)
Basophils Absolute: 0.1 10*3/uL (ref 0.0–0.1)
Basophils Relative: 1 %
Eosinophils Absolute: 0.5 10*3/uL (ref 0.0–0.5)
Eosinophils Relative: 5 %
HCT: 25.7 % — ABNORMAL LOW (ref 36.0–46.0)
Hemoglobin: 7.8 g/dL — ABNORMAL LOW (ref 12.0–15.0)
Immature Granulocytes: 0 %
Lymphocytes Relative: 18 %
Lymphs Abs: 1.6 10*3/uL (ref 0.7–4.0)
MCH: 24.1 pg — ABNORMAL LOW (ref 26.0–34.0)
MCHC: 30.4 g/dL (ref 30.0–36.0)
MCV: 79.6 fL — ABNORMAL LOW (ref 80.0–100.0)
Monocytes Absolute: 0.4 10*3/uL (ref 0.1–1.0)
Monocytes Relative: 4 %
Neutro Abs: 6.3 10*3/uL (ref 1.7–7.7)
Neutrophils Relative %: 72 %
Platelet Count: 378 10*3/uL (ref 150–400)
RBC: 3.23 MIL/uL — ABNORMAL LOW (ref 3.87–5.11)
RDW: 23.8 % — ABNORMAL HIGH (ref 11.5–15.5)
WBC Count: 8.8 10*3/uL (ref 4.0–10.5)
nRBC: 0 % (ref 0.0–0.2)

## 2023-05-25 LAB — SEDIMENTATION RATE: Sed Rate: 56 mm/h — ABNORMAL HIGH (ref 0–30)

## 2023-05-25 LAB — CBC
HCT: 25 % — ABNORMAL LOW (ref 36.0–46.0)
Hemoglobin: 7.6 g/dL — ABNORMAL LOW (ref 12.0–15.0)
MCH: 24.3 pg — ABNORMAL LOW (ref 26.0–34.0)
MCHC: 30.4 g/dL (ref 30.0–36.0)
MCV: 79.9 fL — ABNORMAL LOW (ref 80.0–100.0)
Platelets: 355 K/uL (ref 150–400)
RBC: 3.13 MIL/uL — ABNORMAL LOW (ref 3.87–5.11)
RDW: 23.8 % — ABNORMAL HIGH (ref 11.5–15.5)
WBC: 8.7 K/uL (ref 4.0–10.5)
nRBC: 0 % (ref 0.0–0.2)

## 2023-05-25 LAB — COMPREHENSIVE METABOLIC PANEL WITH GFR
ALT: 11 U/L (ref 0–44)
AST: 16 U/L (ref 15–41)
Albumin: 2.5 g/dL — ABNORMAL LOW (ref 3.5–5.0)
Alkaline Phosphatase: 79 U/L (ref 38–126)
Anion gap: 8 (ref 5–15)
BUN: 16 mg/dL (ref 8–23)
CO2: 26 mmol/L (ref 22–32)
Calcium: 8.2 mg/dL — ABNORMAL LOW (ref 8.9–10.3)
Chloride: 101 mmol/L (ref 98–111)
Creatinine, Ser: 0.39 mg/dL — ABNORMAL LOW (ref 0.44–1.00)
GFR, Estimated: >60 mL/min
Glucose, Bld: 116 mg/dL — ABNORMAL HIGH (ref 70–99)
Potassium: 3.3 mmol/L — ABNORMAL LOW (ref 3.5–5.1)
Sodium: 135 mmol/L (ref 135–145)
Total Bilirubin: 0.6 mg/dL (ref 0.0–1.2)
Total Protein: 5.9 g/dL — ABNORMAL LOW (ref 6.5–8.1)

## 2023-05-25 LAB — RETIC PANEL
Immature Retic Fract: 21.7 % — ABNORMAL HIGH (ref 2.3–15.9)
RBC.: 3.22 MIL/uL — ABNORMAL LOW (ref 3.87–5.11)
Retic Count, Absolute: 94.3 10*3/uL (ref 19.0–186.0)
Retic Ct Pct: 2.9 % (ref 0.4–3.1)
Reticulocyte Hemoglobin: 29.9 pg (ref >27.9)

## 2023-05-25 LAB — C-REACTIVE PROTEIN: CRP: 0.6 mg/dL (ref <1.0)

## 2023-05-25 LAB — IRON AND TIBC
Iron: 26 ug/dL — ABNORMAL LOW (ref 28–170)
Saturation Ratios: 10 % — ABNORMAL LOW (ref 10.4–31.8)
TIBC: 255 ug/dL (ref 250–450)
UIBC: 229 ug/dL

## 2023-05-25 LAB — FERRITIN: Ferritin: 64 ng/mL (ref 11–307)

## 2023-05-25 MED ORDER — HEPARIN SOD (PORK) LOCK FLUSH 100 UNIT/ML IV SOLN
INTRAVENOUS | Status: AC
  Filled 2023-05-25: qty 5

## 2023-05-25 MED ORDER — SODIUM CHLORIDE 0.9% FLUSH
10.0000 mL | INTRAVENOUS | Status: DC | PRN
Start: 2023-05-25 — End: 2023-05-25
  Administered 2023-05-25: 10 mL via INTRAVENOUS
  Filled 2023-05-25: qty 10

## 2023-05-25 MED ORDER — HEPARIN SOD (PORK) LOCK FLUSH 100 UNIT/ML IV SOLN
500.0000 [IU] | Freq: Once | INTRAVENOUS | Status: AC
  Administered 2023-05-25: 250 [IU] via INTRAVENOUS
  Filled 2023-05-25: qty 5

## 2023-05-25 MED ORDER — HEPARIN SOD (PORK) LOCK FLUSH 100 UNIT/ML IV SOLN: 500.0000 [IU] | Freq: Once | INTRAVENOUS | Status: DC

## 2023-05-25 MED ORDER — DEXTROSE 5 % IV SOLN
1000.0000 mg | Freq: Once | INTRAVENOUS | Status: AC
  Administered 2023-05-25: 1000 mg via INTRAVENOUS
  Filled 2023-05-25: qty 50

## 2023-05-25 NOTE — Telephone Encounter (Signed)
 Received PCS service request from [facility]. Gave to Alyssa to complete-Toni

## 2023-05-26 ENCOUNTER — Encounter: Payer: 59 | Admitting: Neurosurgery

## 2023-05-28 ENCOUNTER — Inpatient Hospital Stay: Payer: 59

## 2023-05-28 ENCOUNTER — Encounter: Payer: Self-pay | Admitting: Oncology

## 2023-05-28 ENCOUNTER — Inpatient Hospital Stay (HOSPITAL_BASED_OUTPATIENT_CLINIC_OR_DEPARTMENT_OTHER): Payer: 59 | Admitting: Oncology

## 2023-05-28 VITALS — BP 109/52 | HR 96 | Temp 97.8°F

## 2023-05-28 VITALS — BP 107/89 | HR 90 | Temp 97.8°F

## 2023-05-28 DIAGNOSIS — K22710 Barrett's esophagus with low grade dysplasia: Secondary | ICD-10-CM

## 2023-05-28 DIAGNOSIS — D500 Iron deficiency anemia secondary to blood loss (chronic): Secondary | ICD-10-CM | POA: Diagnosis not present

## 2023-05-28 DIAGNOSIS — B9561 Methicillin susceptible Staphylococcus aureus infection as the cause of diseases classified elsewhere: Secondary | ICD-10-CM | POA: Diagnosis not present

## 2023-05-28 DIAGNOSIS — R7881 Bacteremia: Secondary | ICD-10-CM | POA: Diagnosis not present

## 2023-05-28 DIAGNOSIS — D509 Iron deficiency anemia, unspecified: Secondary | ICD-10-CM | POA: Diagnosis not present

## 2023-05-28 MED ORDER — SODIUM CHLORIDE 0.9 % IV SOLN
Freq: Once | INTRAVENOUS | Status: AC
  Filled 2023-05-28: qty 250

## 2023-05-28 MED ORDER — HEPARIN SOD (PORK) LOCK FLUSH 100 UNIT/ML IV SOLN
250.0000 [IU] | Freq: Once | INTRAVENOUS | Status: AC | PRN
  Administered 2023-05-28: 250 [IU]
  Filled 2023-05-28: qty 5

## 2023-05-28 MED ORDER — DIPHENHYDRAMINE HCL 50 MG/ML IJ SOLN
25.0000 mg | Freq: Once | INTRAMUSCULAR | Status: AC
  Administered 2023-05-28: 25 mg via INTRAVENOUS

## 2023-05-28 MED ORDER — IRON SUCROSE 20 MG/ML IV SOLN
200.0000 mg | Freq: Once | INTRAVENOUS | Status: AC
  Administered 2023-05-28: 200 mg via INTRAVENOUS

## 2023-05-28 NOTE — Progress Notes (Addendum)
 Hematology/Oncology Progress note [phone] [phone]    Patient Care Team: [name], NP as PCP - General (Nurse Practitioner) Monika Salk, [facility] (Inactive) as Pharmacist (Pharmacist) Rickard Patience, MD as Consulting Physician (Oncology)  ASSESSMENT & PLAN:   Iron deficiency anemia #Iron deficiency anemia, Labs reviewed and discussed with patient. Lab Results  Component Value Date   HGB 7.8 (L) 05/25/2023   TIBC 255 05/25/2023   IRONPCTSAT 10 (L) 05/25/2023   FERRITIN 64 05/25/2023   Ferritin could be falsely elevated due to inflammation. Retic hemoglobin is decreased, indicating iron deficiency anemia.  Recommend IV venofer weekly x 4 She has tolerated IV Venofer well in the past. I will discontinue Benadryl as premed as it worsen her restless leg syndrome.   Barrett esophagus S/p EGD on 02/03/2023  Benign-appearing esophageal stenosis. Dilated.  There is no endoscopic evidence of Barrett's esophagus.   MSSA bacteremia Finish course of planned antibiotics.  Follow up with ID   Orders Placed This Encounter  Procedures   CBC with Differential (Cancer Center Only)    Standing Status:   Future    Expected Date:   07/28/2023    Expiration Date:   05/27/2024   Iron and TIBC    Standing Status:   Future    Expected Date:   07/28/2023    Expiration Date:   05/27/2024   Ferritin    Standing Status:   Future    Expected Date:   07/28/2023    Expiration Date:   05/27/2024   Retic Panel    Standing Status:   Future    Expected Date:   07/28/2023    Expiration Date:   05/27/2024   Follow up in 2 months  All questions were answered. The patient knows to call the clinic with any problems, questions or concerns.  Rickard Patience, MD, PhD [facility] Hematology Oncology 05/28/2023   CHIEF COMPLAINTS/REASON FOR VISIT:  Follow-up iron deficiency anemia  HISTORY OF PRESENTING ILLNESS:   Summer Murphy is a  68 y.o.  female with PMH listed below was seen in  consultation at the request of  [name], NP  for evaluation of iron deficiency anemia.  Reviewed patient's previous labs.  Anemia is chronic, date back to August 2021.  09/16/20 TIBC 454, ferritin 12, iron saturation 5,  10/11/20 Hemoglobin 10.5,   Patient history of Barrett's esophagus, has had multiple EGDs.  Recent EGDs listed below.  05/29/2020 upper EGD Barrett's esophagus. Treated with argon plasma  coagulation (APC).   - Benign-appearing esophageal stenosis. Dilated.  - LA Grade B reflux esophagitis with no bleeding.  - An anti-reflux surgical site was found herniated  above the diaphragm.   07/24/2020 upper EGD showed Barrett's esophagus. Treated with argon plasma coagulation (APC). Benign-appearing esophageal stenosis. Dilated.      - An anti-reflux surgical site was found.   - Large hiatal hernia.   10/02/2020 upper EGD showed There is no endoscopic evidence of Barrett's esophagus. Biopsied. Large hiatal hernia, - An anti-reflux surgical site was found. - A large amount of food (residue) in the stomach.  Stomach high cardia biopsy showed cardio-oxyntic type mucosa with faveolar hyperplasia.  Esophagus biopsy showed squamous esophageal mucosa with chronic esophagitis and increased intraepithelial eosinophils.   She has had colonoscopy in 2018, procedure was aborted due to poor prep.   Patient reports fatigue. Denies any black or bloody stool. She has chronic constipation and can not tolerate oral iron supplementation.   INTERVAL HISTORY  Summer Murphy is a 68 y.o. female who has above history reviewed by me today presents for follow up visit for management of iron deficiency anemia. Patient reports feeling well.  Patient only got one dose IV Venofer treatment since last visit  +worse fatigue. S/p  C3-6 posterior cervical laminectomy and fusion  04/16/2023 with sepsis after fall.  Workup revealed MSSA bacteremia and abscess/discitis at cervical fusion site.  Patient underwent  drainage via neurosurgery and was placed on cefazolin and rifampin.  Subsequently, PICC line placed for 6 weeks of IV cafazolin until 3/17 and then will be transitioned to PO cefadroxil  Denies hematochezia, hematuria, hematemesis, epistaxis, black tarry stool or easy bruising.    Review of Systems  Constitutional:  Positive for fatigue. Negative for appetite change, chills and fever.  HENT:   Negative for hearing loss and voice change.   Eyes:  Negative for eye problems.  Respiratory:  Negative for chest tightness and cough.   Cardiovascular:  Negative for chest pain.  Gastrointestinal:  Negative for abdominal distention, abdominal pain and blood in stool.  Endocrine: Negative for hot flashes.  Genitourinary:  Negative for difficulty urinating and frequency.   Musculoskeletal:  Positive for neck pain. Negative for arthralgias.  Skin:  Negative for itching and rash.  Neurological:  Negative for dizziness and extremity weakness.  Hematological:  Negative for adenopathy.  Psychiatric/Behavioral:  Negative for confusion.     MEDICAL HISTORY:  Past Medical History:  Diagnosis Date   Acid reflux    Anemia    Anxiety    a.) buspirone BID + BZO PRN (clonazepam)   Arthritis    Asthma    Cervical disc disorder with radiculopathy of cervical region    Chronic pain syndrome    a.) on COT as prescribed by pain mangement   Chronic, continuous use of opioids    a.) followed by pain mangement   COPD (chronic obstructive pulmonary disease) (HCC)    DOE (dyspnea on exertion)    Hematuria    Hiatal hernia    History of kidney stones    Hypertension    Hypoglycemia    Insomnia    a.) uses Trazodone PRN   LBBB (left bundle branch block)    Migraine    Murmur    Osteopenia    Palpitations    Pre-diabetes    Restless leg    a.) on ropinirole   Stenosis of cervical spine with myelopathy (HCC)    T2DM (type 2 diabetes mellitus) (HCC)     SURGICAL HISTORY: Past Surgical History:   Procedure Laterality Date   ABDOMINAL HYSTERECTOMY     AMPUTATION TOE Left 07/31/2016   Procedure: AMPUTATION TOE/MPJ 2nd toe;  Surgeon: Linus Galas, DPM;  Location: [facility] ORS;  Service: Podiatry;  Laterality: Left;   APPENDECTOMY  1990   BACK SURGERY     low back   BREAST SURGERY     bilateral breast reduction   CARDIAC ELECTROPHYSIOLOGY STUDY AND ABLATION     CERVICAL WOUND DEBRIDEMENT N/A 04/17/2023   Procedure: CERVICAL WOUND DEBRIDEMENT;  Surgeon: Lucy Chris, MD;  Location: [facility];  Service: Neurosurgery;  Laterality: N/A;   CHOLECYSTECTOMY  1990   COLONOSCOPY WITH PROPOFOL N/A 01/04/2017   Procedure: COLONOSCOPY WITH PROPOFOL;  Surgeon: Scot Jun, MD;  Location: [facility] ENDOSCOPY;  Service: Endoscopy;  Laterality: N/A;   CORNEAL TRANSPLANT     ESOPHAGOGASTRODUODENOSCOPY N/A 04/09/2021   Procedure: ESOPHAGOGASTRODUODENOSCOPY (EGD);  Surgeon: Toney Reil, MD;  Location: [facility] ENDOSCOPY;  Service: Gastroenterology;  Laterality: N/A;   ESOPHAGOGASTRODUODENOSCOPY (EGD) WITH PROPOFOL N/A 01/04/2017   Procedure: ESOPHAGOGASTRODUODENOSCOPY (EGD) WITH PROPOFOL;  Surgeon: Scot Jun, MD;  Location: [facility] ENDOSCOPY;  Service: Endoscopy;  Laterality: N/A;   EXCISION BONE CYST Left 07/31/2016   Procedure: EXCISION BONE CYST/exostectomy 28124/left 2nd;  Surgeon: Linus Galas, DPM;  Location: [facility];  Service: Podiatry;  Laterality: Left;   EXTRACORPOREAL SHOCK WAVE LITHOTRIPSY Left 09/12/2015   Procedure: EXTRACORPOREAL SHOCK WAVE LITHOTRIPSY (ESWL);  Surgeon: Vanna Scotland, MD;  Location: [facility] ORS;  Service: Urology;  Laterality: Left;   FRACTURE SURGERY     left foot   GUM SURGERY Left    gum infection   HARDWARE REMOVAL Left 11/14/2020   Procedure: HARDWARE REMOVAL;  Surgeon: Kennedy Bucker, MD;  Location: [facility] OR[facility];  Service: Orthopedics;  Laterality: Left;   HH repair     Fundoplication   JOINT REPLACEMENT Bilateral 2013,2014   total knees   LAPAROSCOPIC  HYSTERECTOMY     LITHOTRIPSY     periprosthetic supracondylar fracture of left femur  02/16/2020   [facility]   POSTERIOR CERVICAL FUSION/FORAMINOTOMY N/A 03/02/2023   Procedure: C3-6 POSTERIOR CERVICAL LAMINECTOMY AND FUSION;  Surgeon: Lovenia Kim, MD;  Location: [facility] ORS;  Service: Neurosurgery;  Laterality: N/A;   TONSILLECTOMY     TOTAL KNEE REVISION Right 11/14/2019   Procedure: Revision patella and tibial polyethylene;  Surgeon: Kennedy Bucker, MD;  Location: [facility] ORS;  Service: Orthopedics;  Laterality: Right;   URETEROSCOPY      SOCIAL HISTORY: Social History   Socioeconomic History   Marital status: Widowed    Spouse name: Not on file   Number of children: Not on file   Years of education: Not on file   Highest education level: Not on file  Occupational History   Not on file  Tobacco Use   Smoking status: Never    Passive exposure: Past   Smokeless tobacco: Never  Vaping Use   Vaping status: Never Used  Substance and Sexual Activity   Alcohol use: No   Drug use: No   Sexual activity: Not Currently  Other Topics Concern   Not on file  Social History Narrative   Lives at home alone. Relatives are the support person.    Social Drivers of Health   Financial Resource Strain: Medium Risk (05/27/2023)   Received from [facility]   Overall Financial Resource Strain (CARDIA)    Difficulty of Paying Living Expenses: Somewhat hard  Food Insecurity: Food Insecurity Present (05/27/2023)   Received from [facility]   Hunger Vital Sign    Worried About Running Out of Food in the Last Year: Never true    Ran Out of Food in the Last Year: Sometimes true  Transportation Needs: No Transportation Needs (05/27/2023)   Received from [facility] - Transportation    In the past 12 months, has lack of transportation kept you from medical appointments or from getting medications?: No    Lack of Transportation  (Non-Medical): No  Physical Activity: Not on file  Stress: Not on file  Social Connections: Moderately Isolated (04/16/2023)   Social Connection and Isolation Panel [NHANES]    Frequency of Communication with Friends and Family: More than three times a week    Frequency of Social Gatherings with Friends and Family: Once a week    Attends Religious Services: More than 4 times per year  Active Member of Clubs or Organizations: No    Attends Banker Meetings: Never    Marital Status: Widowed  Intimate Partner Violence: Not At Risk (04/16/2023)   Humiliation, Afraid, Rape, and Kick questionnaire    Fear of Current or Ex-Partner: No    Emotionally Abused: No    Physically Abused: No    Sexually Abused: No    FAMILY HISTORY: Family History  Problem Relation Age of Onset   Hypertension Mother    Stroke Mother    Prostate cancer Father    Kidney Stones Father    Diabetes Brother    Hypertension Brother    Breast cancer Maternal Aunt    Kidney disease Neg Hx     ALLERGIES:  is allergic to librium [chlordiazepoxide], reglan [metoclopramide], vanilla, aspirin, nsaids, azithromycin, buprenorphine hcl, flexeril [cyclobenzaprine], morphine, sulfa antibiotics, tolmetin, amlodipine besylate, and iron.  MEDICATIONS:  Current Outpatient Medications  Medication Sig Dispense Refill   acetaminophen (TYLENOL) 500 MG tablet Take 2 tablets (1,000 mg total) by mouth every 6 (six) hours as needed. 100 tablet 2   alendronate (FOSAMAX) 70 MG tablet Take 1 tablet (70 mg total) by mouth once a week. Take with a full glass of water on an empty stomach. 12 tablet 3   apixaban (ELIQUIS) 5 MG TABS tablet Take 5 mg by mouth 2 (two) times daily.     busPIRone (BUSPAR) 10 MG tablet Take 1 tablet (10 mg total) by mouth 2 (two) times daily. 60 tablet 2   ceFAZolin (ANCEF) IVPB Inject 2 g into the vein every 8 (eight) hours. Indication:  MSSA bacteremia and surgical site infection Last Day of Therapy:   05/28/2023 Labs - Once weekly:  CBC/D, CMP, and ESR and CRP Method of administration: IV Push. Method of administration may be changed at the discretion of facility and its pharmacy. Fax weekly lab results  promptly to [phone]. Please pull PIC at completion of IV antibiotics. Call [phone] with any critical value or questions. 115 Units 0   clonazePAM (KLONOPIN) 1 MG tablet Take 0.5-1 tablets (0.5-1 mg total) by mouth 2 (two) times daily as needed for anxiety. 60 tablet 0   COMBIVENT RESPIMAT 20-100 MCG/ACT AERS respimat 1 puff every 6 (six) hours.     diclofenac Sodium (VOLTAREN) 1 % GEL Apply 4 g topically 4 (four) times daily. (Patient taking differently: Apply 4 g topically 4 (four) times daily as needed (pain).) 350 g 1   DULoxetine (CYMBALTA) 60 MG capsule Take 1 capsule (60 mg total) by mouth at bedtime. 90 capsule 1   Fe Fum-Vit C-Vit B12-FA (TRIGELS-F FORTE) CAPS capsule Take 1 capsule by mouth 2 (two) times daily.     fluticasone-salmeterol (WIXELA INHUB) 250-50 MCG/ACT AEPB Inhale 1 puff into the lungs in the morning and at bedtime. 60 each 12   gabapentin (NEURONTIN) 300 MG capsule Take 1 capsule (300 mg total) by mouth 2 (two) times daily. 60 capsule 2   HYDROcodone-acetaminophen (NORCO/VICODIN) 5-325 MG tablet Take 1 tablet by mouth every 6 (six) hours as needed for up to 7 days for severe pain (pain score 7-10). 28 tablet 0   ipratropium-albuterol (DUONEB) 0.5-2.5 (3) MG/3ML SOLN Take 3 mLs by nebulization every 6 (six) hours as needed.     methocarbamol (ROBAXIN) 750 MG tablet Take 1 tablet (750 mg total) by mouth every 8 (eight) hours as needed for muscle spasms. 90 tablet 5   montelukast (SINGULAIR) 10 MG tablet Take 1 tablet (10  mg total) by mouth at bedtime. 90 tablet 1   ondansetron (ZOFRAN) 4 MG tablet TAKE 1 TABLET BY MOUTH TWICE DAILY AS NEEDED 45 tablet 1   pantoprazole (PROTONIX) 40 MG tablet Take 1 tablet (40 mg total) by mouth 2 (two) times daily. 180 tablet 1    polyethylene glycol (MIRALAX / GLYCOLAX) 17 g packet Take 17 g by mouth 2 (two) times daily.     rOPINIRole (REQUIP) 1 MG tablet Take 1 tablet (1 mg total) by mouth at bedtime. 90 tablet 1   senna (SENOKOT) 8.6 MG TABS tablet Take 1 tablet (8.6 mg total) by mouth 2 (two) times daily as needed for mild constipation. 30 tablet 0   SUMAtriptan (IMITREX) 100 MG tablet Take 1 tablet (100 mg total) by mouth every 2 (two) hours as needed (for migraine headaches.). May repeat in 1 hours if headache persists or recurs. 10 tablet 3   traZODone (DESYREL) 150 MG tablet TAKE 1 TABLET(150 MG) BY MOUTH AT BEDTIME 30 tablet 2   No current facility-administered medications for this visit.     PHYSICAL EXAMINATION: ECOG PERFORMANCE STATUS: 1 - Symptomatic but completely ambulatory Vitals:   05/28/23 1000  BP: (!) 109/52  Pulse: 96  Temp: 97.8 F (36.6 C)   There were no vitals filed for this visit.   Physical Exam Constitutional:      General: She is not in acute distress. HENT:     Head: Normocephalic.  Eyes:     General: No scleral icterus. Cardiovascular:     Rate and Rhythm: Normal rate and regular rhythm.  Pulmonary:     Effort: Pulmonary effort is normal. No respiratory distress.     Breath sounds: No wheezing.  Abdominal:     General: There is no distension.     Palpations: Abdomen is soft.  Musculoskeletal:        General: Normal range of motion.     Cervical back: Normal range of motion.  Skin:    General: Skin is warm and dry.     Findings: No erythema or rash.  Neurological:     Mental Status: She is alert and oriented to person, place, and time. Mental status is at baseline.     Cranial Nerves: No cranial nerve deficit.  Psychiatric:        Mood and Affect: Mood normal.     LABORATORY DATA:  I have reviewed the data as listed    Latest Ref Rng & Units 05/25/2023    3:29 PM 05/25/2023    1:29 PM 05/18/2023    1:14 PM  CBC  WBC 4.0 - 10.5 K/uL 8.8  8.7  8.5    Hemoglobin 12.0 - 15.0 g/dL 7.8  7.6  7.9   Hematocrit 36.0 - 46.0 % 25.7  25.0  25.5   Platelets 150 - 400 K/uL 378  355  435       Latest Ref Rng & Units 05/25/2023    1:29 PM 05/18/2023    1:14 PM 04/27/2023    6:18 AM  CMP  Glucose 70 - 99 mg/dL 161  096    BUN 8 - 23 mg/dL 16  15    Creatinine 0.45 - 1.00 mg/dL 4.09  8.11    Sodium 914 - 145 mmol/L 135  133    Potassium 3.5 - 5.1 mmol/L 3.3  3.5    Chloride 98 - 111 mmol/L 101  97    CO2 22 - 32 mmol/L 26  30    Calcium 8.9 - 10.3 mg/dL 8.2  8.1    Total Protein 6.5 - 8.1 g/dL 5.9  6.2  6.0   Total Bilirubin 0.0 - 1.2 mg/dL 0.6  0.2  0.2   Alkaline Phos 38 - 126 U/L 79  105  88   AST 15 - 41 U/L 16  18  13    ALT 0 - 44 U/L 11  6  6      Lab Results  Component Value Date   IRON 26 (L) 05/25/2023   TIBC 255 05/25/2023   FERRITIN 64 05/25/2023    RADIOGRAPHIC STUDIES: I have personally reviewed the radiological images as listed and agreed with the findings in the report. CT GUIDED SOFT TISSUE FLUID DRAIN BY PERC CATH Result Date: 04/25/2023 INDICATION: 68 year old with bilateral groin pain and recent CT imaging demonstrates bilateral psoas fluid collections that are concerning for infection. EXAM: 1. CT-guided aspiration of right psoas muscle 2. CT-guided placement of drain in left psoas abscess-drain was removed at the end of the procedure MEDICATIONS: Moderate sedation ANESTHESIA/SEDATION: Moderate (conscious) sedation was employed during this procedure. A total of Versed 1mg  and fentanyl 100 mcg was administered intravenously at the order of the provider performing the procedure. Total intra-service moderate sedation time: 40 minutes. Patient's level of consciousness and vital signs were monitored continuously by radiology nurse throughout the procedure under the supervision of the provider performing the procedure. COMPLICATIONS: None immediate. PROCEDURE: Informed written consent was obtained from the patient after a thorough  discussion of the procedural risks, benefits and alternatives. All questions were addressed. Maximal Sterile Barrier Technique was utilized including caps, mask, sterile gowns, sterile gloves, sterile drape, hand hygiene and skin antiseptic. A timeout was performed prior to the initiation of the procedure. Patient was placed prone. CT images through the lower abdomen were obtained. The back was prepped and draped in sterile fashion. Skin was anesthetized with 1% lidocaine. Using CT guidance, an 18 gauge trocar needle was directed into the left psoas muscle near the small pockets of air. No purulent fluid could be aspirated. Attempted to advance a wire into the psoas muscle but wire would not advance very far. Needle was repositioned but again no significant purulent fluid could be aspirated. Small drops of bloody fluid were obtained. This fluid was collected for culture. Attention was directed to the left psoas. Skin was anesthetized with 1% lidocaine. Using CT guidance, a new 18 gauge trocar needle was directed into the left psoas muscle and bloody purulent fluid was draining from the needle. Superstiff Amplatz wire was advanced into the left psoas muscle. Tract was dilated to accommodate a 10 [place] multipurpose drain. Follow up CT images demonstrated that the drain was not tracking along the expected course of the abscess. Rather the drain was along the anterior aspect of the psoas muscle. The drain was pulled back and tried to reposition with a wire but again drain was not appropriately positioned. As a result, the drain was completely removed at the end of the procedure. RADIATION DOSE REDUCTION: This exam was performed according to the departmental dose-optimization program which includes automated exposure control, adjustment of the mA and/or kV according to patient size and/or use of iterative reconstruction technique. FINDINGS: Small foci of gas in the right psoas muscle. Needle was directed into the right  psoas muscle but no purulent fluid could be aspirated. Small amount of bloody material was collected for culture. Needle was directed into left psoas abscess and bloody purulent  fluid was aspirated. Approximately 5 mL of bloody purulent fluid was removed from the left psoas muscle. A drain was placed but the drain appeared to be tracking anterior to the psoas muscle and not adequately positioned to drain the abscess. Therefore this drain was removed. IMPRESSION: 1. CT-guided placement of a drain in the left psoas abscess. 5 mL of bloody purulent fluid was removed. However, the drain was not well positioned and eventually removed at the end of the procedure. 2. CT-guided aspiration of a small amount of bloody fluid from the right psoas muscle. Fluid was sent for culture. Electronically Signed   By: Richarda Overlie M.D.   On: 04/25/2023 18:31   CT ASPIRATION N/S Result Date: 04/25/2023 INDICATION: 68 year old with bilateral groin pain and recent CT imaging demonstrates bilateral psoas fluid collections that are concerning for infection. EXAM: 1. CT-guided aspiration of right psoas muscle 2. CT-guided placement of drain in left psoas abscess-drain was removed at the end of the procedure MEDICATIONS: Moderate sedation ANESTHESIA/SEDATION: Moderate (conscious) sedation was employed during this procedure. A total of Versed 1mg  and fentanyl 100 mcg was administered intravenously at the order of the provider performing the procedure. Total intra-service moderate sedation time: 40 minutes. Patient's level of consciousness and vital signs were monitored continuously by radiology nurse throughout the procedure under the supervision of the provider performing the procedure. COMPLICATIONS: None immediate. PROCEDURE: Informed written consent was obtained from the patient after a thorough discussion of the procedural risks, benefits and alternatives. All questions were addressed. Maximal Sterile Barrier Technique was utilized  including caps, mask, sterile gowns, sterile gloves, sterile drape, hand hygiene and skin antiseptic. A timeout was performed prior to the initiation of the procedure. Patient was placed prone. CT images through the lower abdomen were obtained. The back was prepped and draped in sterile fashion. Skin was anesthetized with 1% lidocaine. Using CT guidance, an 18 gauge trocar needle was directed into the left psoas muscle near the small pockets of air. No purulent fluid could be aspirated. Attempted to advance a wire into the psoas muscle but wire would not advance very far. Needle was repositioned but again no significant purulent fluid could be aspirated. Small drops of bloody fluid were obtained. This fluid was collected for culture. Attention was directed to the left psoas. Skin was anesthetized with 1% lidocaine. Using CT guidance, a new 18 gauge trocar needle was directed into the left psoas muscle and bloody purulent fluid was draining from the needle. Superstiff Amplatz wire was advanced into the left psoas muscle. Tract was dilated to accommodate a 10 [place] multipurpose drain. Follow up CT images demonstrated that the drain was not tracking along the expected course of the abscess. Rather the drain was along the anterior aspect of the psoas muscle. The drain was pulled back and tried to reposition with a wire but again drain was not appropriately positioned. As a result, the drain was completely removed at the end of the procedure. RADIATION DOSE REDUCTION: This exam was performed according to the departmental dose-optimization program which includes automated exposure control, adjustment of the mA and/or kV according to patient size and/or use of iterative reconstruction technique. FINDINGS: Small foci of gas in the right psoas muscle. Needle was directed into the right psoas muscle but no purulent fluid could be aspirated. Small amount of bloody material was collected for culture. Needle was directed into  left psoas abscess and bloody purulent fluid was aspirated. Approximately 5 mL of bloody purulent fluid was removed  from the left psoas muscle. A drain was placed but the drain appeared to be tracking anterior to the psoas muscle and not adequately positioned to drain the abscess. Therefore this drain was removed. IMPRESSION: 1. CT-guided placement of a drain in the left psoas abscess. 5 mL of bloody purulent fluid was removed. However, the drain was not well positioned and eventually removed at the end of the procedure. 2. CT-guided aspiration of a small amount of bloody fluid from the right psoas muscle. Fluid was sent for culture. Electronically Signed   By: Richarda Overlie M.D.   On: 04/25/2023 18:31   MR Lumbar Spine W Wo Contrast Result Date: 04/25/2023 CLINICAL DATA:  Low back pain, infection suspected, positive xray/CT EXAM: MRI LUMBAR SPINE WITHOUT AND WITH CONTRAST TECHNIQUE: Multiplanar and multiecho pulse sequences of the lumbar spine were obtained without and with intravenous contrast. CONTRAST:  5mL GADAVIST GADOBUTROL 1 MMOL/ML IV SOLN COMPARISON:  MRI 04/19/2023, CT 04/24/2023 FINDINGS: Segmentation:  Standard. Alignment: Trace retrolisthesis of L1 on L2 and L5 on S1. Slight levocurvature. Vertebrae: Small amount of fluid within the L3-L4 disc space with endplate irregularity and increasing bone marrow edema in the L3 and L4 vertebral bodies. Corresponding low T1 bone marrow signal. There is also increasing bone marrow edema and low T1 signal changes associated with the L4-L5 disc space. Small amount of enhancing fluid in the anterior epidural space between the L3 and L4 levels with suspected epidural abscess in the left lateral recess at the level of the L4 vertebral body (series 11, image 23). No fractures. Conus medullaris and cauda equina: Conus extends to the L2 level. Conus and cauda equina appear normal. Paraspinal and other soft tissues: Rim enhancing bilateral psoas muscle abscesses. No  posterior paraspinal muscle abscess. Disc levels: T12-L1 through L2-L3: Degenerative disc disease and facet arthropathy, unchanged from prior. L3-L4: Degree of disc height loss appears progressed compared to the recent prior. Increasing disc bulge and endplate spurring. Mild-to-moderate bilateral facet arthropathy. Moderate-severe canal stenosis with moderate bilateral foraminal stenosis. Findings have progressed from prior. L4-L5: Progressed disc height loss with new central disc protrusion. Mild-to-moderate bilateral facet arthropathy. Mild canal stenosis with moderate bilateral foraminal stenosis. Findings have progressed from prior. L5-S1:. Disc bulge and endplate spurring, eccentric to the right. Mild bilateral facet arthropathy. Mild right subarticular recess stenosis without canal stenosis. Severe left and mild-moderate right foraminal stenosis. No appreciable change. IMPRESSION: 1. Findings consistent with discitis-osteomyelitis at L3-L4 and L4-L5. Small amount of enhancing fluid in the anterior epidural space between the L3 and L4 levels with suspected epidural abscess in the left lateral recess at the level of the L4 vertebral body. 2. Bilateral psoas muscle abscesses. 3. Multilevel lumbar spondylosis, most pronounced at L3-L4 where there is moderate-severe canal stenosis and moderate bilateral foraminal stenosis. 4. Mild canal stenosis at L4-L5 with moderate bilateral foraminal stenosis. 5. Severe left and mild-moderate right foraminal stenosis at L5-S1. Electronically Signed   By: Duanne Guess D.O.   On: 04/25/2023 16:05   CT ABDOMEN PELVIS W WO CONTRAST Result Date: 04/24/2023 CLINICAL DATA:  Follow-up psoas muscle abscess. EXAM: CT ABDOMEN AND PELVIS WITHOUT AND WITH CONTRAST TECHNIQUE: Multidetector CT imaging of the abdomen and pelvis was performed following the standard protocol before and following the bolus administration of intravenous contrast. RADIATION DOSE REDUCTION: This exam was  performed according to the departmental dose-optimization program which includes automated exposure control, adjustment of the mA and/or kV according to patient size and/or use of iterative reconstruction  technique. CONTRAST:  80mL OMNIPAQUE IOHEXOL 300 MG/ML  SOLN COMPARISON:  04/16/2023 FINDINGS: Lower chest: Patchy areas of ground-glass attenuation are again seen within the right middle lobe in both lower lobes. Bronchiectasis identified within the lingula. No pleural fluid or consolidative change. Hepatobiliary: No suspicious liver lesion. Status post cholecystectomy. Mild intrahepatic bile duct dilatation. Common bile duct measures 8 mm in diameter. This is unchanged compared with the previous exam. In the absence of clinical signs or symptoms of biliary obstruction these findings likely represent post cholecystectomy physiology. Clinical correlation advised. Pancreas: Unremarkable. No pancreatic ductal dilatation or surrounding inflammatory changes. Spleen: Normal in size without focal abnormality. Adrenals/Urinary Tract: Normal adrenal glands. No kidney mass or obstructive uropathy. The bladder appears within normal limits. Stomach/Bowel: Postoperative changes about the GE junction with signs of recurrent moderate hiatal hernia. No pathologic dilatation of the large or small bowel loops to suggest obstruction. No bowel wall thickening, inflammation, or distension. Mild stool burden identified within the transverse colon. Vascular/Lymphatic: Aortic atherosclerosis. No aneurysm. There is a small mural based filling defect involving the IVC at the level of the bifurcation which appears mural based and may be adherent to the wall of the IVC compatible with thrombus, image 43/7. This does not appear to be occlusive. No signs of abdominopelvic adenopathy. Reproductive: Status post hysterectomy. No adnexal masses. Other: Peripherally enhancing fluid collection within the left psoas muscle measures 3.0 by 2.2 by 8.3  cm, image 52/10. This is suboptimally visualized on the unenhanced CT from 04/16/2023. Within the right psoas muscle there is a complex, rim enhancing fluid collection containing small foci of gas measuring 3.2 x 3.3 by 6.1 cm. Along the medial margin of the psoas muscle there is a small punctate phos is of gas, image 42/7. Additionally, there is increased soft tissue thickening within the paravertebral space about the L3-4 level containing a small punctate focus of gas, image 35/7. Musculoskeletal: No acute osseous findings. Lumbar degenerative disc disease identified. No definite focal bone erosions involving the endplates at the L3-4 level where there is increased paravertebral soft tissue containing a small focus of gas. IMPRESSION: 1. There is a complex, rim enhancing fluid collection containing small foci of gas within the right psoas muscle measuring 3.2 x 3.3 x 6.1 cm. This is compatible with psoas abscess. 2. There is a peripherally enhancing fluid collection within the left psoas muscle measuring 3.0 x 2.2 x 8.3 cm. This is suboptimally visualized on the unenhanced CT from 04/16/2023. This is also compatible with psoas abscess. 3. Increased soft tissue thickening within the paravertebral space about the L3-4 level containing a small punctate focus of gas. This is concerning for paravertebral soft tissue infection. No definite focal bone erosions involving the endplates at the L3-4 level. If clinically indicated repeat MRI with and without contrast material of the lumbar spine would be advised to assess for involvement of the disc space. 4. Nonocclusive filling defect within the IVC at the level of the bifurcation compatible with thrombus. 5. Postoperative changes about the GE junction with signs of recurrent moderate hiatal hernia. 6. Patchy areas of ground-glass attenuation are again seen within the right middle lobe in both lower lobes. Bronchiectasis identified within the lingula. Imaging findings are  likely the sequelae of multifocal inflammation/infection. 7. Aortic Atherosclerosis (ICD10-I70.0). Electronically Signed   By: Signa Kell M.D.   On: 04/24/2023 14:27   CT HIP RIGHT W CONTRAST Result Date: 04/24/2023 CLINICAL DATA:  Acute right hip pain with limping.  Swelling.  EXAM: CT OF THE LOWER RIGHT EXTREMITY WITH CONTRAST TECHNIQUE: Multidetector CT imaging of the lower right extremity was performed according to the standard protocol following intravenous contrast administration. RADIATION DOSE REDUCTION: This exam was performed according to the departmental dose-optimization program which includes automated exposure control, adjustment of the mA and/or kV according to patient size and/or use of iterative reconstruction technique. CONTRAST:  80mL OMNIPAQUE IOHEXOL 300 MG/ML  SOLN COMPARISON:  CT pelvis 04/16/2023 FINDINGS: Bones/Joint/Cartilage Chondrocalcinosis in the pubic symphysis. Mild to moderate degenerative chondral thinning in the right hip. Ligaments Suboptimally assessed by CT. Muscles and Tendons Compared to 04/16/2023 there appears to be expansion of the right distal psoas muscle concerning for inflammation or infection. Reviewing the psoas musculature on recent lumbar MRI of 04/19/2023, the region is highly obscured and blurred by motion artifact, but given the findings on today's exam I do have concern both psoas muscles may have accentuated T2 signal which could be indicators of inflammation, hematoma, or infection. Contrast enhanced CT abdomen/pelvis with and without contrast, if feasible, might be utilized to reassess the bilateral psoas musculature with less detrimental effect of motion artifact. Soft tissues Trace subcutaneous edema lateral to the right hip. Mild sigmoid colon diverticulosis. IMPRESSION: 1. Abnormal expansion and likely inflammation/edema of the distal right psoas muscle. Severely limited 04/19/2023 lumbar MRI due to motion artifact clouds assessment, but in the  context of today's CT findings, concern is raised for bilateral psoas muscle inflammation/infection and discitis-osteomyelitis at the L3-4 level. 2. Mild to moderate degenerative chondral thinning in the right hip. 3. Chondrocalcinosis in the pubic symphysis. 4. Mild sigmoid colon diverticulosis. Electronically Signed   By: Gaylyn Rong M.D.   On: 04/24/2023 11:33   [place] EKG SITE RITE Result Date: 04/20/2023 If Site Rite image not attached, placement could not be confirmed due to current cardiac rhythm.  MR LUMBAR SPINE WO CONTRAST Result Date: 04/19/2023 CLINICAL DATA:  Lumbar radiculopathy, prior surgery, new symptoms. EXAM: MRI LUMBAR SPINE WITHOUT CONTRAST TECHNIQUE: Multiplanar, multisequence MR imaging of the lumbar spine was performed. No intravenous contrast was administered. COMPARISON:  MRI of the lumbar spine November 05, 2005. FINDINGS: The study is severely degraded by motion. Segmentation: 5 lumbar type vertebrae assumed. Alignment:  Small retrolisthesis at L1-2 and L4-5. Vertebrae: STIR images suggest endplate marrow edema at L3-4. However, these images are severely degraded by motion. T12-L1 appear chronically fused. No fracture with significant height loss of retropulsion. Conus medullaris and cauda equina: Conus extends to the L1-2 level. Paraspinal and other soft tissues: Grossly unremarkable. Disc levels: T12-L1: Fused level. No high-grade spinal canal or neural foraminal stenosis. L1-2: Disc bulge and facet degenerative changes with mild bilateral neural foraminal narrowing. No high-grade spinal canal stenosis. L2-3: Disc bulge and facet degenerative changes results in likely moderate spinal canal stenosis and mild-to-moderate bilateral neural foraminal narrowing. L3-4: Likely mild to moderate spinal canal stenosis and mild bilateral neural foraminal narrowing. L4-5: Likely moderate spinal canal stenosis and moderate bilateral neural foraminal narrowing. L5-S1: There appears to be severe  left and moderate right neural foraminal narrowing. No spinal canal stenosis. IMPRESSION: 1. Severely motion degraded study. Repeat study when clinically appropriate suggested. 2. Multilevel degenerative changes of the lumbar spine with likely moderate spinal canal stenosis at L2-3 and L4-5. 3. Multilevel neural foraminal narrowing, severe on the left at L5-S1. Electronically Signed   By: Baldemar Lenis M.D.   On: 04/19/2023 15:27   ECHOCARDIOGRAM COMPLETE Result Date: 04/19/2023    ECHOCARDIOGRAM REPORT  Patient Name:   Summer Murphy Date of Exam: 04/18/2023 Medical Rec #:  [id number]         Height:       63.0 in Accession #:    [id number]        Weight:       125.2 lb Date of Birth:  01-19-1956         BSA:          1.585 m Patient Age:    67 years          BP:           139/62 mmHg Patient Gender: F                 HR:           95 bpm. Exam Location:  [facility] Procedure: 2D Echo, Cardiac Doppler and Color Doppler Indications:     Bacteremia R78.81  History:         Patient has prior history of Echocardiogram examinations, most                  recent 04/21/2021. Migraine and COPD, Signs/Symptoms:Dyspnea and                  Shortness of Breath; Risk Factors:Diabetes.  Sonographer:     Lucendia Herrlich RCS Referring Phys:  [id number] AMIN Diagnosing Phys: Chilton Si MD IMPRESSIONS  1. Left ventricular ejection fraction, by estimation, is 45 to 50%. The left ventricle has mildly decreased function. The left ventricle demonstrates regional wall motion abnormalities (see scoring diagram/findings for description). Left ventricular diastolic parameters are consistent with Grade I diastolic dysfunction (impaired relaxation).  2. Right ventricular systolic function is normal. The right ventricular size is normal. There is moderately elevated pulmonary artery systolic pressure.  3. Left atrial size was severely dilated.  4. The mitral valve is normal in structure. Mild to moderate mitral valve  regurgitation. No evidence of mitral stenosis.  5. Tricuspid valve regurgitation is mild to moderate.  6. The aortic valve is tricuspid. Aortic valve regurgitation is not visualized. No aortic stenosis is present.  7. The inferior vena cava is dilated in size with <50% respiratory variability, suggesting right atrial pressure of 15 mmHg. FINDINGS  Left Ventricle: Left ventricular ejection fraction, by estimation, is 45 to 50%. The left ventricle has mildly decreased function. The left ventricle demonstrates regional wall motion abnormalities. The left ventricular internal cavity size was normal in size. There is no left ventricular hypertrophy. Left ventricular diastolic parameters are consistent with Grade I diastolic dysfunction (impaired relaxation). Indeterminate filling pressures.  LV Wall Scoring: The entire inferior wall, mid inferoseptal segment, and basal inferoseptal segment are hypokinetic. The entire anterior wall, entire lateral wall, entire anterior septum, and apex are normal. Right Ventricle: The right ventricular size is normal. No increase in right ventricular wall thickness. Right ventricular systolic function is normal. There is moderately elevated pulmonary artery systolic pressure. The tricuspid regurgitant velocity is 2.91 m/s, and with an assumed right atrial pressure of 15 mmHg, the estimated right ventricular systolic pressure is 48.9 mmHg. Left Atrium: Left atrial size was severely dilated. Right Atrium: Right atrial size was normal in size. Pericardium: There is no evidence of pericardial effusion. Mitral Valve: The mitral valve is normal in structure. Mild to moderate mitral valve regurgitation. No evidence of mitral valve stenosis. Tricuspid Valve: The tricuspid valve is normal in structure. Tricuspid valve regurgitation is mild to moderate. No evidence  of tricuspid stenosis. Aortic Valve: The aortic valve is tricuspid. Aortic valve regurgitation is not visualized. No aortic stenosis is  present. Aortic valve peak gradient measures 6.2 mmHg. Pulmonic Valve: The pulmonic valve was normal in structure. Pulmonic valve regurgitation is not visualized. No evidence of pulmonic stenosis. Aorta: The aortic root is normal in size and structure. Venous: The inferior vena cava is dilated in size with less than 50% respiratory variability, suggesting right atrial pressure of 15 mmHg. IAS/Shunts: No atrial level shunt detected by color flow Doppler.  LEFT VENTRICLE PLAX 2D LVIDd:         5.00 cm      Diastology LVIDs:         4.30 cm      LV e' medial:    8.27 cm/s LV PW:         0.80 cm      LV E/e' medial:  10.1 LV IVS:        0.70 cm      LV e' lateral:   12.30 cm/s LVOT diam:     2.00 cm      LV E/e' lateral: 6.8 LV SV:         51 LV SV Index:   32 LVOT Area:     3.14 cm  LV Volumes (MOD) LV vol d, MOD A2C: 138.0 ml LV vol d, MOD A4C: 99.1 ml LV vol s, MOD A2C: 65.8 ml LV vol s, MOD A4C: 42.6 ml LV SV MOD A2C:     72.2 ml LV SV MOD A4C:     99.1 ml LV SV MOD BP:      65.0 ml RIGHT VENTRICLE             IVC RV S prime:     13.50 cm/s  IVC diam: 2.20 cm TAPSE (M-mode): 2.4 cm LEFT ATRIUM             Index        RIGHT ATRIUM           Index LA diam:        4.30 cm 2.71 cm/m   RA Area:     13.10 cm LA Vol (A2C):   58.2 ml 36.73 ml/m  RA Volume:   30.50 ml  19.25 ml/m LA Vol (A4C):   77.1 ml 48.65 ml/m LA Biplane Vol: 68.8 ml 43.41 ml/m  AORTIC VALVE AV Area (Vmax): 2.11 cm AV Vmax:        125.00 cm/s AV Peak Grad:   6.2 mmHg LVOT Vmax:      83.85 cm/s LVOT Vmean:     55.000 cm/s LVOT VTI:       0.161 m  AORTA Ao Root diam: 2.90 cm Ao Asc diam:  2.90 cm MITRAL VALVE                TRICUSPID VALVE MV Area (PHT): 5.54 cm     TR Peak grad:   33.9 mmHg MV Decel Time: 137 msec     TR Vmax:        291.00 cm/s MR Peak grad: 94.6 mmHg MR Vmax:      486.33 cm/s   SHUNTS MV E velocity: 83.30 cm/s   Systemic VTI:  0.16 m MV A velocity: 121.00 cm/s  Systemic Diam: 2.00 cm MV E/A ratio:  0.69 Chilton Si MD  Electronically signed by Chilton Si MD Signature Date/Time: 04/19/2023/7:52:18 AM    Final    MR  Cervical Spine W and Wo Contrast Result Date: 04/16/2023 CLINICAL DATA:  Myelopathy, acute, cervical spine. Fever. Posterior cervical fusion last month. EXAM: MRI CERVICAL SPINE WITHOUT AND WITH CONTRAST TECHNIQUE: Multiplanar and multiecho pulse sequences of the cervical spine, to include the craniocervical junction and cervicothoracic junction, were obtained without and with intravenous contrast. CONTRAST:  6mL GADAVIST GADOBUTROL 1 MMOL/ML IV SOLN COMPARISON:  Cervical spine CT 04/16/2023 and MRI 11/18/2022 FINDINGS: Multiple sequences are severely motion degraded. Alignment: Mild reversal of the normal cervical lordosis. No significant listhesis. Vertebrae: No fracture, suspicious marrow lesion, or evidence of discitis. C3-C6 posterior decompression and fusion. Predominately moderate disc space narrowing from C3-4 through C6-7 with chronic degenerative endplate changes Cord: Normal signal. Posterior Fossa, vertebral arteries, paraspinal tissues: Unremarkable posterior fossa. Preserved vertebral artery flow voids. 2.3 x 3.8 x 2.9 cm (AP x transverse x craniocaudal) fluid collection in the laminectomy bed at C3-4 with moderately thick rim enhancement. More superficial 2.1 x 4.1 x 6.0 cm fluid collection within the subcutaneous tissues along the posterior cervical incision extending from C3-C7, also with peripheral enhancement. Enhancement in the surrounding soft tissues. Disc levels: C2-3: Left facet ankylosis.  No stenosis. C3-4: Posterior decompression and fusion. Patent spinal canal. Limited foraminal assessment due to artifact. C4-5: Posterior decompression and fusion. Patent spinal canal. Limited foraminal assessment due to artifact. C5-6: Posterior decompression and fusion. Mild residual spinal stenosis based on sagittal images. Suspected moderate residual left neural foraminal stenosis. C6-7: Disc  bulging, uncovertebral spurring, and facet arthrosis result in mild spinal stenosis and suspected moderate to severe right and moderate left neural foraminal stenosis. C7-T1: Mild disc bulging, uncovertebral spurring, and left facet arthrosis without evidence of significant stenosis. IMPRESSION: 1. Severely motion degraded examination. 2. C3-C6 posterior decompression and fusion with improved spinal canal patency. 4 cm fluid collection in the laminectomy bed at C3-4 and 6 cm superficial fluid collection along the posterior incision with infection/abscess is not excluded by imaging. 3. Mild spinal stenosis and moderate to severe right and moderate left neural foraminal stenosis at C6-7. Electronically Signed   By: Sebastian Ache M.D.   On: 04/16/2023 16:42   CT ABDOMEN PELVIS WO CONTRAST Result Date: 04/16/2023 CLINICAL DATA:  Fall. EXAM: CT ABDOMEN AND PELVIS WITHOUT CONTRAST TECHNIQUE: Multidetector CT imaging of the abdomen and pelvis was performed following the standard protocol without IV contrast. RADIATION DOSE REDUCTION: This exam was performed according to the departmental dose-optimization program which includes automated exposure control, adjustment of the mA and/or kV according to patient size and/or use of iterative reconstruction technique. COMPARISON:  CT abdomen/pelvis dated June 03, 2021. FINDINGS: Lower chest: Moderate-sized hiatal hernia, unchanged. No acute abnormality. Hepatobiliary: Hepatic steatosis. No focal liver abnormality is seen. Status post cholecystectomy. No biliary dilatation. Pancreas: Unremarkable. No pancreatic ductal dilatation or surrounding inflammatory changes. Spleen: Normal in size without focal abnormality. Adrenals/Urinary Tract: Adrenal glands are unremarkable. Nonobstructive small bilateral renal calculi, measuring up to 3 mm. No hydronephrosis. Bladder is unremarkable. Stomach/Bowel: Moderate-sized hiatal hernia. The stomach is otherwise unremarkable. Status post  appendectomy. Mildly dilated transverse and descending colon without evidence of obstruction. No inflammatory changes. Sigmoid colonic diverticulosis without evidence of acute diverticulitis. Vascular/Lymphatic: The abdominal aorta is normal in caliber. Aortic atherosclerosis. No enlarged abdominal or pelvic lymph nodes. Reproductive: Status post hysterectomy. No adnexal masses. Other: No abdominopelvic ascites.  No intraperitoneal free air. Musculoskeletal: No acute osseous abnormality. Multilevel degenerative changes of the thoracic and lumbar spine with stable ankylosis at T12-L1. No suspicious lytic or  blastic osseous lesion. IMPRESSION: 1. No acute localizing findings in the abdomen or pelvis. 2. Moderate-sized hiatal hernia, unchanged. 3. Nonobstructive small bilateral renal calculi.  No hydronephrosis. 4. Sigmoid colonic diverticulosis without evidence of acute diverticulitis. 5. Hepatic steatosis Aortic Atherosclerosis (ICD10-I70.0). Electronically Signed   By: Hart Robinsons M.D.   On: 04/16/2023 11:59   DG Knee Complete 4 Views Right Result Date: 04/16/2023 CLINICAL DATA:  Fall. EXAM: RIGHT KNEE - COMPLETE 4+ VIEW COMPARISON:  Right knee radiographs dated 11/14/2019. FINDINGS: Status post right knee arthroplasty in appropriate alignment. Hardware appears intact. No acute fracture or dislocation. Similar hypertrophic osseous changes of the patella. Intra-articular bodies are noted. Mild prepatellar soft tissue swelling. IMPRESSION: Status post right knee arthroplasty.  No acute osseous abnormality. Electronically Signed   By: Hart Robinsons M.D.   On: 04/16/2023 11:39   DG Knee Complete 4 Views Left Result Date: 04/16/2023 CLINICAL DATA:  Fall. EXAM: LEFT KNEE - COMPLETE 4+ VIEW COMPARISON:  Left knee radiographs dated January 27, 2020. FINDINGS: Status post left total knee arthroplasty in appropriate alignment. Remote healed periprosthetic fracture deformity of the distal left femur. Hardware  appears intact. No evidence of acute fracture or dislocation. There is a small joint effusion. Prepatellar soft tissue swelling. IMPRESSION: 1. Status post left total knee arthroplasty in appropriate alignment. No acute osseous abnormality. 2. Small knee joint effusion. Electronically Signed   By: Hart Robinsons M.D.   On: 04/16/2023 11:34   DG Chest 2 View Result Date: 04/16/2023 CLINICAL DATA:  Fall. EXAM: CHEST - 2 VIEW COMPARISON:  Chest radiograph dated September 16, 2022. FINDINGS: The heart size and mediastinal contours are within normal limits. Moderate-to-large hiatal hernia is again noted. No focal consolidation, pleural effusion, or pneumothorax. Surgical clips project over the lower thorax and epigastric region. Postoperative changes of the cervical spine. No acute osseous abnormality. IMPRESSION: No acute findings in the chest. Electronically Signed   By: Hart Robinsons M.D.   On: 04/16/2023 11:30   DG Hip Unilat W or Wo Pelvis 2-3 Views Left Result Date: 04/16/2023 CLINICAL DATA:  Pain after fall 2 days ago. EXAM: DG HIP (WITH OR WITHOUT PELVIS) 2-3V LEFT COMPARISON:  Same day CT abdomen/pelvis. FINDINGS: There is no evidence of hip fracture or dislocation. Mild degenerative changes of the left hip. The sacroiliac joints and pubic symphysis are anatomically aligned. Degenerative changes of the visualized lower lumbar spine. IMPRESSION: No acute osseous abnormality. Electronically Signed   By: Hart Robinsons M.D.   On: 04/16/2023 11:22   CT HEAD WO CONTRAST ( ) Result Date: 04/16/2023 CLINICAL DATA:  Provided history: Head trauma, minor.  Neck trauma. EXAM: CT HEAD WITHOUT CONTRAST CT CERVICAL SPINE WITHOUT CONTRAST TECHNIQUE: Multidetector CT imaging of the head and cervical spine was performed following the standard protocol without intravenous contrast. Multiplanar CT image reconstructions of the cervical spine were also generated. RADIATION DOSE REDUCTION: This exam was performed  according to the departmental dose-optimization program which includes automated exposure control, adjustment of the mA and/or kV according to patient size and/or use of iterative reconstruction technique. COMPARISON:  Head CT 08/18/2019. Cervical spine radiographs 04/02/2023. FINDINGS: CT HEAD FINDINGS Brain: Mild generalized cerebral atrophy. There is no acute intracranial hemorrhage. No demarcated cortical infarct. No extra-axial fluid collection. No evidence of an intracranial mass. No midline shift. Vascular: No hyperdense vessel.  Atherosclerotic calcifications. Skull: No calvarial fracture or aggressive osseous lesion. Sinuses/Orbits: No mass or acute finding within the imaged orbits. 11 mm mucous retention cyst  within the right maxillary sinus. 12 mm mucous retention cyst within the left maxillary sinus. CT CERVICAL SPINE FINDINGS A small portion of the C7 vertebral body is excluded from the field of view anteroinferiorly. Within this limitation, findings are as follows. Alignment: No significant spondylolisthesis. Skull base and vertebrae: The basion-dental and atlanto-dental intervals are maintained.No evidence of acute fracture to the cervical spine. Prior posterior decompression at the C3-C5 levels. A posterior spinal fusion construct spans the C3-C6 levels. Facet ankylosis on the left at C2-C3. Soft tissues and spinal canal: No prevertebral fluid or swelling. No visible canal hematoma. Disc levels: Postoperative changes as described above. Cervical spondylosis. Disc space narrowing is greatest at C5-C6 (advanced at this level). Central disc protrusion at C3-C4. Multilevel endplate spurring and uncovertebral hypertrophy. Streak/beam hardening artifact arising from spinal fusion hardware partially obscures the spinal canal at the operative levels. Within this limitation, there is no appreciable high-grade spinal canal stenosis. Multilevel bony neural foraminal narrowing. Upper chest: The right lung apex  is excluded from the field of view. No consolidation within visualized portions of the left lung apex. No visible pneumothorax. IMPRESSION: CT head: 1. No evidence of an acute intracranial abnormality. 2. Mild cerebral atrophy. 3. Bilateral maxillary sinus mucous retention cysts (measuring up to 12 mm). CT cervical spine: 1. A small portion of the C7 vertebral body is excluded from the field of view anteroinferiorly. 2. Within this limitation, there is no evidence of an acute cervical spine fracture. 3. Cervical spondylosis and postoperative changes as described. 4. Left C2-C3 facet ankylosis. Electronically Signed   By: Jackey Loge D.O.   On: 04/16/2023 11:15   CT Cervical Spine Wo Contrast Result Date: 04/16/2023 CLINICAL DATA:  Provided history: Head trauma, minor.  Neck trauma. EXAM: CT HEAD WITHOUT CONTRAST CT CERVICAL SPINE WITHOUT CONTRAST TECHNIQUE: Multidetector CT imaging of the head and cervical spine was performed following the standard protocol without intravenous contrast. Multiplanar CT image reconstructions of the cervical spine were also generated. RADIATION DOSE REDUCTION: This exam was performed according to the departmental dose-optimization program which includes automated exposure control, adjustment of the mA and/or kV according to patient size and/or use of iterative reconstruction technique. COMPARISON:  Head CT 08/18/2019. Cervical spine radiographs 04/02/2023. FINDINGS: CT HEAD FINDINGS Brain: Mild generalized cerebral atrophy. There is no acute intracranial hemorrhage. No demarcated cortical infarct. No extra-axial fluid collection. No evidence of an intracranial mass. No midline shift. Vascular: No hyperdense vessel.  Atherosclerotic calcifications. Skull: No calvarial fracture or aggressive osseous lesion. Sinuses/Orbits: No mass or acute finding within the imaged orbits. 11 mm mucous retention cyst within the right maxillary sinus. 12 mm mucous retention cyst within the left  maxillary sinus. CT CERVICAL SPINE FINDINGS A small portion of the C7 vertebral body is excluded from the field of view anteroinferiorly. Within this limitation, findings are as follows. Alignment: No significant spondylolisthesis. Skull base and vertebrae: The basion-dental and atlanto-dental intervals are maintained.No evidence of acute fracture to the cervical spine. Prior posterior decompression at the C3-C5 levels. A posterior spinal fusion construct spans the C3-C6 levels. Facet ankylosis on the left at C2-C3. Soft tissues and spinal canal: No prevertebral fluid or swelling. No visible canal hematoma. Disc levels: Postoperative changes as described above. Cervical spondylosis. Disc space narrowing is greatest at C5-C6 (advanced at this level). Central disc protrusion at C3-C4. Multilevel endplate spurring and uncovertebral hypertrophy. Streak/beam hardening artifact arising from spinal fusion hardware partially obscures the spinal canal at the operative levels. Within this  limitation, there is no appreciable high-grade spinal canal stenosis. Multilevel bony neural foraminal narrowing. Upper chest: The right lung apex is excluded from the field of view. No consolidation within visualized portions of the left lung apex. No visible pneumothorax. IMPRESSION: CT head: 1. No evidence of an acute intracranial abnormality. 2. Mild cerebral atrophy. 3. Bilateral maxillary sinus mucous retention cysts (measuring up to 12 mm). CT cervical spine: 1. A small portion of the C7 vertebral body is excluded from the field of view anteroinferiorly. 2. Within this limitation, there is no evidence of an acute cervical spine fracture. 3. Cervical spondylosis and postoperative changes as described. 4. Left C2-C3 facet ankylosis. Electronically Signed   By: Jackey Loge D.O.   On: 04/16/2023 11:15   DG Cervical Spine 2 or 3 views Result Date: 04/07/2023 CLINICAL DATA:  Recent cervical surgery and fall. EXAM: CERVICAL SPINE - 2-3  VIEW COMPARISON:  03/26/2023 FINDINGS: Status post posterior fusion of C3-C6. Hardware is intact. No evidence for loosening or fracture. The LOWER cervical levels are not well seen on the LATERAL view. Lung apices are clear. IMPRESSION: Status post posterior fusion of C3-C6.  No acute abnormality. Electronically Signed   By: Norva Pavlov M.D.   On: 04/07/2023 07:57   DG Cervical Spine 2 or 3 views Result Date: 04/04/2023 CLINICAL DATA:  Status post surgical fusion and recent fall. EXAM: CERVICAL SPINE - 2-3 VIEW COMPARISON:  March 10, 2023. FINDINGS: Status post surgical posterior fusion extending from C3-C6 with bilateral intrapedicular screw placement and posterior laminectomy. Good alignment of vertebral bodies is noted. No acute abnormality is noted. IMPRESSION: Postsurgical changes as noted above. Electronically Signed   By: Lupita Raider M.D.   On: 04/04/2023 10:36   DG Cervical Spine 2 or 3 views Result Date: 03/10/2023 CLINICAL DATA:  Neck pain since falling yesterday. History of cervical fusion 03/02/2023. EXAM: CERVICAL SPINE - 2-3 VIEW COMPARISON:  Preoperative cervical spine radiographs 11/27/2022. Intraoperative images [id number]. No postoperative imaging available. FINDINGS: Postsurgical changes from recent posterior decompression and screw rod fusion bilaterally from C3 through C6. The hardware appears intact. The alignment is similar with straightening. No focal angulation, listhesis or acute osseous abnormalities are identified on this limited two view examination. Posterior skin staples are in place. IMPRESSION: Postsurgical changes from recent posterior decompression and screw rod fusion from C3 through C6. No acute osseous findings identified on this limited two view radiographic examination. Electronically Signed   By: Carey Bullocks M.D.   On: 03/10/2023 14:14   DG Cervical Spine 2-3 Views Result Date: 03/02/2023 CLINICAL DATA:  C3-C6 fusion and laminectomy. EXAM: CERVICAL  SPINE - 2-3 VIEW COMPARISON:  Cervical spine x-rays dated November 27, 2022. FLUOROSCOPY TIME:  Radiation Exposure Index (as provided by the fluoroscopic device): 0.38 mGy Kerma C-arm fluoroscopic images were obtained intraoperatively and submitted for post operative interpretation. FINDINGS: Multiple intraoperative fluoroscopic images demonstrate interval C3-C6 posterior decompression and fusion. No acute osseous abnormality. IMPRESSION: 1. Intraoperative fluoroscopic guidance for C3-C6 posterior decompression and fusion. Electronically Signed   By: Obie Dredge M.D.   On: 03/02/2023 13:26   DG C-Arm 1-60 Min-No Report Result Date: 03/02/2023 Fluoroscopy was utilized by the requesting physician.  No radiographic interpretation.   DG C-Arm 1-60 Min-No Report Result Date: 03/02/2023 Fluoroscopy was utilized by the requesting physician.  No radiographic interpretation.

## 2023-05-28 NOTE — Assessment & Plan Note (Signed)
 S/p EGD on 02/03/2023  Benign-appearing esophageal stenosis. Dilated.  There is no endoscopic evidence of Barrett's esophagus.

## 2023-05-28 NOTE — Addendum Note (Signed)
 Addended by: Rickard Patience on: 05/28/2023 10:01 PM   Modules accepted: Orders

## 2023-05-28 NOTE — Progress Notes (Signed)
 1115: pt experenicing restless leg post Benadryl, pt reports that she has  a home prescriptions medication to take for restless legs.  1125: pt reports " I feel like my heart is beating fast.  Pt denies any chest pain, chest tightness or SOB: B/P 112/64, HR 92 and regular.  Per Dr. Cathie Hoops monitor pt and VS for 30 minutes and okay to discharge if no new or worsening symptoms.  1141: B/P 103/86, HR 88-91 and regular. Pt reports that symptoms are improving.  1154: pt stable, but still "feels like my heart is beating fast" HR 91 and regular.  1200: Pt reports symptoms are improving, B/P 107/89 HR 90 and regular, pt requests to be discharged instead of waiting on MD response. Pt educated when to seek emergency care and when to call clinic, pt verbalizes understanding. Pt stable at discharge.   Pt also reports she would like to try taking PO Benadryl at home prior to coming for infusion instead of IV Benadryl because of the restless legs.

## 2023-05-28 NOTE — Assessment & Plan Note (Addendum)
#  Iron deficiency anemia, Labs reviewed and discussed with patient. Lab Results  Component Value Date   HGB 7.8 (L) 05/25/2023   TIBC 255 05/25/2023   IRONPCTSAT 10 (L) 05/25/2023   FERRITIN 64 05/25/2023   Ferritin could be falsely elevated due to inflammation. Retic hemoglobin is decreased, indicating iron deficiency anemia.  Recommend IV venofer weekly x 4 She has tolerated IV Venofer well in the past. I will discontinue Benadryl as premed as it worsen her restless leg syndrome.

## 2023-05-28 NOTE — Assessment & Plan Note (Signed)
 Finish course of planned antibiotics.  Follow up with ID

## 2023-05-31 ENCOUNTER — Ambulatory Visit (INDEPENDENT_AMBULATORY_CARE_PROVIDER_SITE_OTHER): Payer: 59 | Admitting: Neurosurgery

## 2023-05-31 VITALS — BP 116/70 | Ht 63.0 in | Wt 119.0 lb

## 2023-05-31 DIAGNOSIS — Z981 Arthrodesis status: Secondary | ICD-10-CM

## 2023-05-31 DIAGNOSIS — T8149XA Infection following a procedure, other surgical site, initial encounter: Secondary | ICD-10-CM

## 2023-05-31 DIAGNOSIS — M4802 Spinal stenosis, cervical region: Secondary | ICD-10-CM

## 2023-05-31 DIAGNOSIS — G992 Myelopathy in diseases classified elsewhere: Secondary | ICD-10-CM

## 2023-05-31 NOTE — Progress Notes (Signed)
   REFERRING PHYSICIAN:  [name], Np [address] [place],  [place] [address]  DOS: 03/02/23, C3-6 posterior cervical lami and fusion   HISTORY OF PRESENT ILLNESS: Summer Murphy is here after her cervical spinal washout.  She had multi level/multi location infections including psoas infections and wound infection.  Had a washout.  She has been physical therapy and rehab ever since.  Has been discharged 2 weeks ago.  She has lost a significant amount of weight approximately 20 pounds.  She does feel like her strength is improved but she does continue to complain of neck pain which she has been a persistent issue for her.  In regards to her left upper extremity numbness and tingling this also remains persistent.   PHYSICAL EXAMINATION:  NEUROLOGICAL:  General: In no acute distress.   Awake, alert, oriented to person, place, and time.  Pupils equal round and reactive to light.  Facial tone is symmetric.    Strength: Side Biceps Triceps Deltoid Interossei Grip Wrist Ext. Wrist Flex.  R 5 5 5  4+ 4+ 4+ 4+  L 5 5 5 4 4 4 4    Incision c/d/I. Healing well minimal surrounding erythema.  No drainage.    Assessment / Plan: AVEN CEGIELSKI is one month status post C3-6 cervical lami and fusion.  She had wound washout and multi level/multi location infection including spine infection and psoas infections.  She is currently being treated by infectious disease.  She is also getting infusions for low iron.  She continues to have neck pain however her mobility is improving.  Her upper extremity strength is improving continuously.  We reiterated again today that her myelomalacia that was present preoperatively will continue to be there and will often give her numbness and tingling in her left upper extremity.  This does not improve with decompression often.  In regards to the neck pain her posture is improving.  We did discuss that she most likely have neck pain given her significant level of  spondylosis and arthritis, and that to get her back into a full anatomic position would require a multilevel front back surgery which she is not interested in nor do we feel like would be well indicated that especially in her deconditioned and malnourished state with the none planned weight loss.  We discussed conservative care physical therapy, ice and heat as well as possible TENS unit.  Will plan to reach out to her physical therapist to see whether or not that they utilize TENS therapy.  She can continue to follow-up.  Will reach out to her once her x-rays have been formally read.    Advised to contact the office if any questions or concerns arise.   Jana Half MD Dept of Neurosurgery

## 2023-06-01 ENCOUNTER — Other Ambulatory Visit: Payer: Self-pay

## 2023-06-01 ENCOUNTER — Ambulatory Visit
Admission: RE | Admit: 2023-06-01 | Discharge: 2023-06-01 | Disposition: A | Discharge status: Home / Self Care | Payer: 59 | Source: Ambulatory Visit | Attending: Infectious Diseases | Admitting: Infectious Diseases

## 2023-06-01 ENCOUNTER — Other Ambulatory Visit: Payer: Self-pay | Admitting: Infectious Diseases

## 2023-06-01 VITALS — BP 119/57 | HR 88 | Temp 98.8°F | Resp 18

## 2023-06-01 DIAGNOSIS — T8149XA Infection following a procedure, other surgical site, initial encounter: Secondary | ICD-10-CM

## 2023-06-01 DIAGNOSIS — D500 Iron deficiency anemia secondary to blood loss (chronic): Secondary | ICD-10-CM

## 2023-06-01 DIAGNOSIS — M4620 Osteomyelitis of vertebra, site unspecified: Secondary | ICD-10-CM | POA: Diagnosis not present

## 2023-06-01 DIAGNOSIS — M4646 Discitis, unspecified, lumbar region: Secondary | ICD-10-CM | POA: Insufficient documentation

## 2023-06-01 DIAGNOSIS — R7881 Bacteremia: Secondary | ICD-10-CM | POA: Insufficient documentation

## 2023-06-01 DIAGNOSIS — M4622 Osteomyelitis of vertebra, cervical region: Secondary | ICD-10-CM | POA: Diagnosis not present

## 2023-06-01 DIAGNOSIS — E878 Other disorders of electrolyte and fluid balance, not elsewhere classified: Secondary | ICD-10-CM | POA: Insufficient documentation

## 2023-06-01 DIAGNOSIS — B9561 Methicillin susceptible Staphylococcus aureus infection as the cause of diseases classified elsewhere: Secondary | ICD-10-CM | POA: Insufficient documentation

## 2023-06-01 DIAGNOSIS — E876 Hypokalemia: Secondary | ICD-10-CM

## 2023-06-01 LAB — CBC
HCT: 26.7 % — ABNORMAL LOW (ref 36.0–46.0)
Hemoglobin: 8.1 g/dL — ABNORMAL LOW (ref 12.0–15.0)
MCH: 25.3 pg — ABNORMAL LOW (ref 26.0–34.0)
MCHC: 30.3 g/dL (ref 30.0–36.0)
MCV: 83.4 fL (ref 80.0–100.0)
Platelets: 278 K/uL (ref 150–400)
RBC: 3.2 MIL/uL — ABNORMAL LOW (ref 3.87–5.11)
RDW: 24.1 % — ABNORMAL HIGH (ref 11.5–15.5)
WBC: 6.9 K/uL (ref 4.0–10.5)
nRBC: 0 % (ref 0.0–0.2)

## 2023-06-01 LAB — COMPREHENSIVE METABOLIC PANEL
ALT: 13 U/L (ref 0–44)
AST: 18 U/L (ref 15–41)
Albumin: 2.8 g/dL — ABNORMAL LOW (ref 3.5–5.0)
Alkaline Phosphatase: 76 U/L (ref 38–126)
Anion gap: 9 (ref 5–15)
BUN: 15 mg/dL (ref 8–23)
CO2: 27 mmol/L (ref 22–32)
Calcium: 8.4 mg/dL — ABNORMAL LOW (ref 8.9–10.3)
Chloride: 100 mmol/L (ref 98–111)
Creatinine, Ser: 0.47 mg/dL (ref 0.44–1.00)
GFR, Estimated: >60 mL/min (ref >60)
Glucose, Bld: 114 mg/dL — ABNORMAL HIGH (ref 70–99)
Potassium: 2.5 mmol/L — CL (ref 3.5–5.1)
Sodium: 136 mmol/L (ref 135–145)
Total Bilirubin: 0.4 mg/dL (ref 0.0–1.2)
Total Protein: 6.3 g/dL — ABNORMAL LOW (ref 6.5–8.1)

## 2023-06-01 LAB — SEDIMENTATION RATE: Sed Rate: 50 mm/h — ABNORMAL HIGH (ref 0–30)

## 2023-06-01 LAB — POTASSIUM: Potassium: 2.7 mmol/L — CL (ref 3.5–5.1)

## 2023-06-01 LAB — C-REACTIVE PROTEIN: CRP: 0.7 mg/dL (ref <1.0)

## 2023-06-01 MED ORDER — POTASSIUM CHLORIDE CRYS ER 20 MEQ PO TBCR
EXTENDED_RELEASE_TABLET | ORAL | Status: AC
  Filled 2023-06-01: qty 2

## 2023-06-01 MED ORDER — POTASSIUM CHLORIDE CRYS ER 20 MEQ PO TBCR: 20.0000 meq | EXTENDED_RELEASE_TABLET | Freq: Two times a day (BID) | ORAL | 0 refills | Status: DC

## 2023-06-01 MED ORDER — POTASSIUM CHLORIDE CRYS ER 20 MEQ PO TBCR
40.0000 meq | EXTENDED_RELEASE_TABLET | Freq: Once | ORAL | Status: AC
  Administered 2023-06-01: 40 meq via ORAL

## 2023-06-01 MED ORDER — DEXTROSE 5 % IV SOLN
1000.0000 mg | Freq: Once | INTRAVENOUS | Status: AC
  Administered 2023-06-01: 1000 mg via INTRAVENOUS
  Filled 2023-06-01: qty 50

## 2023-06-02 ENCOUNTER — Other Ambulatory Visit: Payer: Self-pay

## 2023-06-02 ENCOUNTER — Telehealth: Payer: Self-pay

## 2023-06-02 NOTE — Progress Notes (Signed)
 Pt came to the Day surgery for her third and final dose of weekly Dalbavancin . Potassium was 2.5 and rechecked at 2.7. pt given 40 meq of K at day surgery and a prescription for 20 BID  x3 days sent to her pharmacy. She will get repeat K at heme office on Thursday. PICC line was removed at day surgery  Discussed with Trula Ore miles RN at Day surgery

## 2023-06-03 ENCOUNTER — Inpatient Hospital Stay

## 2023-06-03 ENCOUNTER — Other Ambulatory Visit: Payer: Self-pay

## 2023-06-03 VITALS — BP 107/61 | HR 82 | Temp 97.0°F | Resp 16

## 2023-06-03 DIAGNOSIS — E876 Hypokalemia: Secondary | ICD-10-CM

## 2023-06-03 DIAGNOSIS — D500 Iron deficiency anemia secondary to blood loss (chronic): Secondary | ICD-10-CM

## 2023-06-03 DIAGNOSIS — D509 Iron deficiency anemia, unspecified: Secondary | ICD-10-CM | POA: Diagnosis not present

## 2023-06-03 LAB — POTASSIUM: Potassium: 3 mmol/L — ABNORMAL LOW (ref 3.5–5.1)

## 2023-06-03 MED ORDER — FE FUM-VIT C-VIT B12-FA 460-60-0.01-1 MG PO CAPS: 1.0000 | ORAL_CAPSULE | Freq: Two times a day (BID) | ORAL | 3 refills | Status: DC

## 2023-06-03 MED ORDER — POTASSIUM CHLORIDE 20 MEQ/100ML IV SOLN
20.0000 meq | Freq: Once | INTRAVENOUS | Status: AC
  Administered 2023-06-03: 20 meq via INTRAVENOUS

## 2023-06-03 MED ORDER — IRON SUCROSE 20 MG/ML IV SOLN
200.0000 mg | Freq: Once | INTRAVENOUS | Status: AC
  Administered 2023-06-03: 200 mg via INTRAVENOUS
  Filled 2023-06-03: qty 10

## 2023-06-03 MED ORDER — SODIUM CHLORIDE 0.9 % IV SOLN
INTRAVENOUS | Status: DC
  Filled 2023-06-03: qty 250

## 2023-06-03 NOTE — Progress Notes (Signed)
 Pt took 50mg  Benadryl PO prior to treatment.

## 2023-06-03 NOTE — Telephone Encounter (Signed)
 Pt advised we sent med

## 2023-06-03 NOTE — Patient Instructions (Signed)

## 2023-06-08 ENCOUNTER — Encounter: Payer: Self-pay | Admitting: Infectious Diseases

## 2023-06-08 ENCOUNTER — Ambulatory Visit: Payer: 59 | Attending: Infectious Diseases | Admitting: Infectious Diseases

## 2023-06-08 ENCOUNTER — Telehealth: Payer: Self-pay

## 2023-06-08 ENCOUNTER — Telehealth: Payer: Self-pay | Admitting: Student in an Organized Health Care Education/Training Program

## 2023-06-08 ENCOUNTER — Other Ambulatory Visit
Admission: RE | Admit: 2023-06-08 | Discharge: 2023-06-08 | Disposition: A | Discharge status: Home / Self Care | Source: Ambulatory Visit | Attending: Infectious Diseases | Admitting: Infectious Diseases

## 2023-06-08 ENCOUNTER — Other Ambulatory Visit: Payer: Self-pay

## 2023-06-08 VITALS — BP 120/57 | HR 96 | Temp 96.8°F

## 2023-06-08 DIAGNOSIS — M4646 Discitis, unspecified, lumbar region: Secondary | ICD-10-CM | POA: Diagnosis present

## 2023-06-08 DIAGNOSIS — E876 Hypokalemia: Secondary | ICD-10-CM | POA: Insufficient documentation

## 2023-06-08 DIAGNOSIS — M4642 Discitis, unspecified, cervical region: Secondary | ICD-10-CM | POA: Insufficient documentation

## 2023-06-08 DIAGNOSIS — F419 Anxiety disorder, unspecified: Secondary | ICD-10-CM | POA: Insufficient documentation

## 2023-06-08 DIAGNOSIS — B9561 Methicillin susceptible Staphylococcus aureus infection as the cause of diseases classified elsewhere: Secondary | ICD-10-CM | POA: Diagnosis not present

## 2023-06-08 DIAGNOSIS — R7881 Bacteremia: Secondary | ICD-10-CM

## 2023-06-08 DIAGNOSIS — M4626 Osteomyelitis of vertebra, lumbar region: Secondary | ICD-10-CM

## 2023-06-08 DIAGNOSIS — A4902 Methicillin resistant Staphylococcus aureus infection, unspecified site: Secondary | ICD-10-CM | POA: Diagnosis not present

## 2023-06-08 LAB — CBC WITH DIFFERENTIAL/PLATELET
Abs Immature Granulocytes: 0.04 10*3/uL (ref 0.00–0.07)
Basophils Absolute: 0.1 10*3/uL (ref 0.0–0.1)
Basophils Relative: 1 %
Eosinophils Absolute: 0.2 10*3/uL (ref 0.0–0.5)
Eosinophils Relative: 2 %
HCT: 32.4 % — ABNORMAL LOW (ref 36.0–46.0)
Hemoglobin: 9.9 g/dL — ABNORMAL LOW (ref 12.0–15.0)
Immature Granulocytes: 0 %
Lymphocytes Relative: 12 %
Lymphs Abs: 1.3 10*3/uL (ref 0.7–4.0)
MCH: 25.8 pg — ABNORMAL LOW (ref 26.0–34.0)
MCHC: 30.6 g/dL (ref 30.0–36.0)
MCV: 84.6 fL (ref 80.0–100.0)
Monocytes Absolute: 0.6 10*3/uL (ref 0.1–1.0)
Monocytes Relative: 6 %
Neutro Abs: 8.2 10*3/uL — ABNORMAL HIGH (ref 1.7–7.7)
Neutrophils Relative %: 79 %
Platelets: 372 10*3/uL (ref 150–400)
RBC: 3.83 MIL/uL — ABNORMAL LOW (ref 3.87–5.11)
RDW: 23.7 % — ABNORMAL HIGH (ref 11.5–15.5)
Smear Review: NORMAL
WBC: 10.4 10*3/uL (ref 4.0–10.5)
nRBC: 0 % (ref 0.0–0.2)

## 2023-06-08 LAB — COMPREHENSIVE METABOLIC PANEL
ALT: 12 U/L (ref 0–44)
AST: 15 U/L (ref 15–41)
Albumin: 3.1 g/dL — ABNORMAL LOW (ref 3.5–5.0)
Alkaline Phosphatase: 68 U/L (ref 38–126)
Anion gap: 10 (ref 5–15)
BUN: 15 mg/dL (ref 8–23)
CO2: 27 mmol/L (ref 22–32)
Calcium: 8.7 mg/dL — ABNORMAL LOW (ref 8.9–10.3)
Chloride: 100 mmol/L (ref 98–111)
Creatinine, Ser: 0.38 mg/dL — ABNORMAL LOW (ref 0.44–1.00)
GFR, Estimated: >60 mL/min (ref >60)
Glucose, Bld: 89 mg/dL (ref 70–99)
Potassium: 3 mmol/L — ABNORMAL LOW (ref 3.5–5.1)
Sodium: 137 mmol/L (ref 135–145)
Total Bilirubin: 0.5 mg/dL (ref 0.0–1.2)
Total Protein: 6.8 g/dL (ref 6.5–8.1)

## 2023-06-08 LAB — SEDIMENTATION RATE: Sed Rate: 44 mm/h — ABNORMAL HIGH (ref 0–30)

## 2023-06-08 LAB — C-REACTIVE PROTEIN: CRP: 0.8 mg/dL (ref <1.0)

## 2023-06-08 MED ORDER — CEFADROXIL 500 MG PO CAPS: 1000.0000 mg | ORAL_CAPSULE | Freq: Two times a day (BID) | ORAL | 1 refills | Status: DC

## 2023-06-08 MED ORDER — APIXABAN 5 MG PO TABS: 5.0000 mg | ORAL_TABLET | Freq: Two times a day (BID) | ORAL | 1 refills | Status: DC

## 2023-06-08 NOTE — Telephone Encounter (Signed)
 Lm x1 for the patient.

## 2023-06-08 NOTE — Progress Notes (Signed)
 NAME: Summer Murphy  DOB: 03-06-1956  MRN: [id number]  Date/Time: 06/08/2023 11:40 AM  REQUESTING PROVIDER Subjective:  REASON FOR CONSULT:  ?KRISTY SCHOMBURG is a 68 y.o. with a history of 68 y.o. with a history of cervical myelpathy for which she  underwent c3-c6 laminectomy and posterior fusion on 03/02/23, COPD, barrettts esophagus, hiatus hernia, HTN, was admitted to [facility] between  after a fall at home. She had a fever of 101.4 in the ED MRI of the cervical spine  showed collection both deep at the laminectomy bed and superficial at the site of surgical incision site Blood culture came back positive for MSSA and she was started on cefazolin Pt was seen by neurosurgeon  and taken to the OR  and underwent irrigation and debridement.,  Culture was MSSA. Pt was also c/o b/l groin pain and leg pain and had MRI of the lumbar spine on 04/25/23 and it showed discitis-osteomyelitis at L3-L4 and L4-L5. Small amount of enhancing fluid in the anterior epidural space between the L3 and L4 levels with suspected epidural abscess in the left lateral recess at the level of the L4 vertebral body. 2. Bilateral psoas muscle abscesses. She could not tolerate Po rifampin because of severe upper GI symptoms She was sent to SNF on 2/4 with 6 weeks of cefazolin to finish on 06/07/23 But she was discharged from SNF on 05/18/23  because of insurance. She received weekly Dalbavancin X 3 doses at day surgery on 2/25, 3/4 and 3/11 Today she is here for follow up Her sister in law is on the phone  Her inflammatory markers have improved but have not yet normalized.  She experiences ongoing neck pain, described as hurting and sometimes causing numbness, with occasional radiation down her arms and legs. She uses a walker for mobility.  She has bladder control issues, which have worsened since her surgery, characterized by an urgent need to urinate. Her bowel function remains normal.  Her appetite is good, and she is  trying to regain weight, currently weighing 117 pounds, down from her usual 135 pounds. She drinks a Boost daily to help with nutrition.  She has a history of low potassium levels, for which she has been receiving potassium pills. She is also receiving iron infusions due to low iron levels and plans to follow up with her primary care doctor for further management of these issues.  She lives alone in an apartment but has a supportive network of friends and family who visit regularly.  Past Medical History:  Diagnosis Date   Acid reflux    Anemia    Anxiety    a.) buspirone BID + BZO PRN (clonazepam)   Arthritis    Asthma    Cervical disc disorder with radiculopathy of cervical region    Chronic pain syndrome    a.) on COT as prescribed by pain mangement   Chronic, continuous use of opioids    a.) followed by pain mangement   COPD (chronic obstructive pulmonary disease) (HCC)    DOE (dyspnea on exertion)    Hematuria    Hiatal hernia    History of kidney stones    Hypertension    Hypoglycemia    Insomnia    a.) uses Trazodone PRN   LBBB (left bundle branch block)    Migraine    Murmur    Osteopenia    Palpitations    Pre-diabetes    Restless leg    a.) on ropinirole   Stenosis  of cervical spine with myelopathy (HCC)    T2DM (type 2 diabetes mellitus) (HCC)     Past Surgical History:  Procedure Laterality Date   ABDOMINAL HYSTERECTOMY     AMPUTATION TOE Left 07/31/2016   Procedure: AMPUTATION TOE/MPJ 2nd toe;  Surgeon: Linus Galas, DPM;  Location: [facility] ORS;  Service: Podiatry;  Laterality: Left;   APPENDECTOMY  1990   BACK SURGERY     low back   BREAST SURGERY     bilateral breast reduction   CARDIAC ELECTROPHYSIOLOGY STUDY AND ABLATION     CERVICAL WOUND DEBRIDEMENT N/A 04/17/2023   Procedure: CERVICAL WOUND DEBRIDEMENT;  Surgeon: Lucy Chris, MD;  Location: [facility];  Service: Neurosurgery;  Laterality: N/A;   CHOLECYSTECTOMY  1990   COLONOSCOPY WITH PROPOFOL N/A  01/04/2017   Procedure: COLONOSCOPY WITH PROPOFOL;  Surgeon: Scot Jun, MD;  Location: [facility] END[facility]COPY;  Service: Endoscopy;  Laterality: N/A;   CORNEAL TRANSPLANT     ESOPHAGOGASTRODUODENOSCOPY N/A 04/09/2021   Procedure: ESOPHAGOGASTRODUODENOSCOPY (EGD);  Surgeon: Toney Reil, MD;  Location: [facility] ENDOSCOPY;  Service: Gastroenterology;  Laterality: N/A;   ESOPHAGOGASTRODUODENOSCOPY (EGD) WITH PROPOFOL N/A 01/04/2017   Procedure: ESOPHAGOGASTRODUODENOSCOPY (EGD) WITH PROPOFOL;  Surgeon: Scot Jun, MD;  Location: [facility] ENDOSCOPY;  Service: Endoscopy;  Laterality: N/A;   EXCISION BONE CYST Left 07/31/2016   Procedure: EXCISION BONE CYST/exostectomy 28124/left 2nd;  Surgeon: Linus Galas, DPM;  Location: [facility] OR[facility];  Service: Podiatry;  Laterality: Left;   EXTRACORPOREAL SHOCK WAVE LITHOTRIPSY Left 09/12/2015   Procedure: EXTRACORPOREAL SHOCK WAVE LITHOTRIPSY (ESWL);  Surgeon: Vanna Scotland, MD;  Location: [facility] ORS;  Service: Urology;  Laterality: Left;   FRACTURE SURGERY     left foot   GUM SURGERY Left    gum infection   HARDWARE REMOVAL Left 11/14/2020   Procedure: HARDWARE REMOVAL;  Surgeon: Kennedy Bucker, MD;  Location: [facility] ORS;  Service: Orthopedics;  Laterality: Left;   HH repair     Fundoplication   JOINT REPLACEMENT Bilateral 2013,2014   total knees   LAPAROSCOPIC HYSTERECTOMY     LITHOTRIPSY     periprosthetic supracondylar fracture of left femur  02/16/2020   [facility]   POSTERIOR CERVICAL FUSION/FORAMINOTOMY N/A 03/02/2023   Procedure: C3-6 POSTERIOR CERVICAL LAMINECTOMY AND FUSION;  Surgeon: Lovenia Kim, MD;  Location: [facility] ORS;  Service: Neurosurgery;  Laterality: N/A;   TONSILLECTOMY     TOTAL KNEE REVISION Right 11/14/2019   Procedure: Revision patella and tibial polyethylene;  Surgeon: Kennedy Bucker, MD;  Location: [facility] OR[facility];  Service: Orthopedics;  Laterality: Right;   URETEROSCOPY      Social History   Socioeconomic History   Marital  status: Widowed    Spouse name: Not on file   Number of children: Not on file   Years of education: Not on file   Highest education level: Not on file  Occupational History   Not on file  Tobacco Use   Smoking status: Never    Passive exposure: Past   Smokeless tobacco: Never  Vaping Use   Vaping status: Never Used  Substance and Sexual Activity   Alcohol use: No   Drug use: No   Sexual activity: Not Currently  Other Topics Concern   Not on file  Social History Narrative   Lives at home alone. Relatives are the support person.    Social Drivers of Health   Financial Resource Strain: Medium Risk (05/27/2023)   Received from [facility]   Overall Financial Resource  Strain (CARDIA)    Difficulty of Paying Living Expenses: Somewhat hard  Food Insecurity: Food Insecurity Present (05/27/2023)   Received from [facility]   Hunger Vital Sign    Worried About Running Out of Food in the Last Year: Never true    Ran Out of Food in the Last Year: Sometimes true  Transportation Needs: No Transportation Needs (05/27/2023)   Received from [facility] - Transportation    In the past 12 months, has lack of transportation kept you from medical appointments or from getting medications?: No    Lack of Transportation (Non-Medical): No  Physical Activity: Not on file  Stress: Not on file  Social Connections: Moderately Isolated (04/16/2023)   Social Connection and Isolation Panel [NHANES]    Frequency of Communication with Friends and Family: More than three times a week    Frequency of Social Gatherings with Friends and Family: Once a week    Attends Religious Services: More than 4 times per year    Active Member of [employer] or Organizations: No    Attends Banker Meetings: Never    Marital Status: Widowed  Intimate Partner Violence: Not At Risk (04/16/2023)   Humiliation, Afraid, Rape, and Kick questionnaire    Fear of  Current or Ex-Partner: No    Emotionally Abused: No    Physically Abused: No    Sexually Abused: No    Family History  Problem Relation Age of Onset   Hypertension Mother    Stroke Mother    Prostate cancer Father    Kidney Stones Father    Diabetes Brother    Hypertension Brother    Breast cancer Maternal Aunt    Kidney disease Neg Hx    Allergies  Allergen Reactions   Librium [Chlordiazepoxide] Shortness Of Breath   Reglan [Metoclopramide] Hives and Other (See Comments)    hallucinations    Vanilla Shortness Of Breath    Pt reports allergy to vanilla extract only   Aspirin Hives   Nsaids Rash    Rash/flares asthma issues.   Azithromycin Other (See Comments)    Unsure of what the reaction was.   Buprenorphine Hcl Other (See Comments)    Unsure of what the reaction was.    Flexeril [Cyclobenzaprine]     hallucinations   Morphine Other (See Comments)    Unsure of reaction   Sulfa Antibiotics     Other reaction(s): Unknown   Tolmetin     Other Reaction: Allergy   Amlodipine Besylate Itching and Rash    arms, stomach and forehead   Iron Nausea And Vomiting   I? Current Outpatient Medications  Medication Sig Dispense Refill   acetaminophen (TYLENOL) 500 MG tablet Take 2 tablets (1,000 mg total) by mouth every 6 (six) hours as needed. 100 tablet 2   alendronate (FOSAMAX) 70 MG tablet Take 1 tablet (70 mg total) by mouth once a week. Take with a full glass of water on an empty stomach. 12 tablet 3   apixaban (ELIQUIS) 5 MG TABS tablet Take 5 mg by mouth 2 (two) times daily.     busPIRone (BUSPAR) 10 MG tablet Take 1 tablet (10 mg total) by mouth 2 (two) times daily. 60 tablet 2   clonazePAM (KLONOPIN) 1 MG tablet Take 0.5-1 tablets (0.5-1 mg total) by mouth 2 (two) times daily as needed for anxiety. 60 tablet 0   COMBIVENT RESPIMAT 20-100 MCG/ACT AERS respimat 1 puff every 6 (  six) hours.     diclofenac Sodium (VOLTAREN) 1 % GEL Apply 4 g topically 4 (four) times daily.  (Patient taking differently: Apply 4 g topically 4 (four) times daily as needed (pain).) 350 g 1   DULoxetine (CYMBALTA) 60 MG capsule Take 1 capsule (60 mg total) by mouth at bedtime. 90 capsule 1   Fe Fum-Vit C-Vit B12-FA (TRIGELS-F FORTE) CAPS capsule Take 1 capsule by mouth 2 (two) times daily. 180 capsule 3   fluticasone-salmeterol (WIXELA INHUB) 250-50 MCG/ACT AEPB Inhale 1 puff into the lungs in the morning and at bedtime. 60 each 12   gabapentin (NEURONTIN) 300 MG capsule Take 1 capsule (300 mg total) by mouth 2 (two) times daily. 60 capsule 2   ipratropium-albuterol (DUONEB) 0.5-2.5 (3) MG/3ML SOLN Take 3 mLs by nebulization every 6 (six) hours as needed.     methocarbamol (ROBAXIN) 750 MG tablet Take 1 tablet (750 mg total) by mouth every 8 (eight) hours as needed for muscle spasms. 90 tablet 5   montelukast (SINGULAIR) 10 MG tablet Take 1 tablet (10 mg total) by mouth at bedtime. 90 tablet 1   ondansetron (ZOFRAN) 4 MG tablet TAKE 1 TABLET BY MOUTH TWICE DAILY AS NEEDED 45 tablet 1   pantoprazole (PROTONIX) 40 MG tablet Take 1 tablet (40 mg total) by mouth 2 (two) times daily. 180 tablet 1   polyethylene glycol (MIRALAX / GLYCOLAX) 17 g packet Take 17 g by mouth 2 (two) times daily.     rOPINIRole (REQUIP) 1 MG tablet Take 1 tablet (1 mg total) by mouth at bedtime. 90 tablet 1   senna (SENOKOT) 8.6 MG TABS tablet Take 1 tablet (8.6 mg total) by mouth 2 (two) times daily as needed for mild constipation. 30 tablet 0   SUMAtriptan (IMITREX) 100 MG tablet Take 1 tablet (100 mg total) by mouth every 2 (two) hours as needed (for migraine headaches.). May repeat in 1 hours if headache persists or recurs. 10 tablet 3   traZODone (DESYREL) 150 MG tablet TAKE 1 TABLET(150 MG) BY MOUTH AT BEDTIME 30 tablet 2   potassium chloride SA (KLOR-CON M) 20 MEQ tablet Take 1 tablet (20 mEq total) by mouth 2 (two) times daily for 3 days. 6 tablet 0   No current facility-administered medications for this visit.      Abtx:  Anti-infectives (From admission, onward)    None       REVIEW OF SYSTEMS:  Const: negative fever, negative chills, negative weight loss Eyes: negative diplopia or visual changes, negative eye pain ENT: negative coryza, negative sore throat Resp: negative cough, hemoptysis, dyspnea Cards: negative for chest pain, palpitations, lower extremity edema GU: negative for frequency, dysuria and hematuria GI: Negative for abdominal pain, diarrhea, bleeding, constipation Skin: negative for rash and pruritus Heme: negative for easy bruising and gum/nose bleeding VH:QION pain Neurolo:negative for headaches, dizziness, vertigo, memory problems  Psych:  anxiety, depression  Endocrine: negative for thyroid, diabetes Allergy/Immunology- as above Objective:  VITALS:  BP (!) 120/57   Pulse 96   Temp (!) 96.8 F (36 C) (Temporal)   SpO2 96%   PHYSICAL EXAM:  General: Alert, cooperative, no distress, appears stated age.  Head: Normocephalic, without obvious abnormality, atraumatic. Eyes: Conjunctivae clear, anicteric sclerae. Pupils are equal ENT Nares normal. No drainage or sinus tenderness. Lips, mucosa, and tongue normal. No Thrush Neck: Supple, symmetrical, no adenopathy, thyroid: non tender no carotid bruit and no JVD. Back: No CVA tenderness. Lungs: Clear to auscultation bilaterally. No  Wheezing or Rhonchi. No rales. Heart: Regular rate and rhythm, no murmur, rub or gallop. Abdomen: Soft, non-tender,not distended. Bowel sounds normal. No masses Extremities: atraumatic, no cyanosis. No edema. No clubbing Skin: No rashes or lesions. Or bruising Lymph: Cervical, supraclavicular normal. Neurologic: Grossly non-focal Pertinent Labs Lab Results CBC    Component Value Date/Time   WBC 6.9 06/01/2023 1237   RBC 3.20 (L) 06/01/2023 1237   HGB 8.1 (L) 06/01/2023 1237   HGB 7.8 (L) 05/25/2023 1529   HGB 13.4 08/29/2021 1303   HCT 26.7 (L) 06/01/2023 1237   HCT 39.4  08/29/2021 1303   PLT 278 06/01/2023 1237   PLT 378 05/25/2023 1529   PLT 186 08/29/2021 1303   MCV 83.4 06/01/2023 1237   MCV 91 08/29/2021 1303   MCV 84 06/28/2014 1710   MCH 25.3 (L) 06/01/2023 1237   MCHC 30.3 06/01/2023 1237   RDW 24.1 (H) 06/01/2023 1237   RDW 12.6 08/29/2021 1303   RDW 16.7 (H) 06/28/2014 1710   LYMPHSABS 1.6 05/25/2023 1529   LYMPHSABS 2.0 08/29/2021 1303   LYMPHSABS 0.6 (L) 06/13/2013 0505   MONOABS 0.4 05/25/2023 1529   MONOABS 0.1 (L) 06/13/2013 0505   EOSABS 0.5 05/25/2023 1529   EOSABS 0.2 08/29/2021 1303   EOSABS 0.0 06/13/2013 0505   BASOSABS 0.1 05/25/2023 1529   BASOSABS 0.0 08/29/2021 1303   BASOSABS 0.0 06/13/2013 0505       Latest Ref Rng & Units 06/03/2023    9:54 AM 06/01/2023    2:10 PM 06/01/2023   12:37 PM  CMP  Glucose 70 - 99 mg/dL   347   BUN 8 - 23 mg/dL   15   Creatinine 4.25 - 1.00 mg/dL   9.56   Sodium 387 - 564 mmol/L   136   Potassium 3.5 - 5.1 mmol/L 3.0  2.7  2.5   Chloride 98 - 111 mmol/L   100   CO2 22 - 32 mmol/L   27   Calcium 8.9 - 10.3 mg/dL   8.4   Total Protein 6.5 - 8.1 g/dL   6.3   Total Bilirubin 0.0 - 1.2 mg/dL   0.4   Alkaline Phos 38 - 126 U/L   76   AST 15 - 41 U/L   18   ALT 0 - 44 U/L   13     ? Impression/Recommendation ?Recent c3-c6 laminectomy and posterior fusion on 03/02/23? MSSA bacteremia  due to cervical spine surgical site infection MRI showed superficial and deep abscess- underwent I/D and culture was MSSA Repeat blood culture from 1/26  and 2/2 were neg Pt was on IV cefazoin for total of 6 weeks until 3/17, but on 2/25 she was discharged from SNF due to insurance- So she received Iv dalbavance weekly dose ( 2/25, 1500mg , 3/4 1 grm, 3.11 - 1gm. Because of hardware added rifampin 300mg  Po BID but she was not able to to tolerate the rifampin as she had severe nausea and poor appetite.  Hence it was discontinued.  Today will start PO cefadroxil 1 gram Q 12 for another 6- 8 weeks because of  hardware     Newly diagnosed L4 and L4-L5 discitis and osteomyelitis - underwent aspiration of the collection but culture neg     Anemia   Hiatus hernia h/o fundoplication Dysphagia has had esophageal dilatation Nov 2024 Biopsy no evidence of metaplasia ? Anxiety  on meds ? Hypokalemia- received IV and also PO Will do labs today  ________________________________________________ Discussed with patient, and sister in law Follow up 6 weeks Note:  This document was prepared using Dragon voice recognition software and may include unintentional dictation errors.

## 2023-06-08 NOTE — Patient Instructions (Signed)
 Today, we discussed your ongoing treatment for the MSSA infection in your blood, lumbar spine, and cervical spine. We also addressed your neck pain, bladder control issues, low potassium levels, iron deficiency anemia, and weight loss. Your inflammatory markers have improved, and we have a plan to continue your recovery.  YOUR PLAN:  -METHICILLIN-SENSITIVE STAPHYLOCOCCUS AUREUS (MSSA) INFECTION: MSSA is a type of bacterial infection that has affected your blood and spine. You have completed six weeks of IV antibiotics, and now you will transition to oral cefadroxil for 6-10 weeks. This medication should be taken with food, two pills in the morning and two in the evening. ( 1000mg  twice a day)  We will monitor your blood markers, kidney, and liver function every two weeks.  -NECK PAIN: Your neck pain, which sometimes causes numbness and radiates to your arms and legs, has worsened since your cervical spine surgery. Neurosurgery considering a TENS unit for pain management as no further surgery is planned.  -BLADDER DYSFUNCTION: You are experiencing urgency and incontinence, which have worsened since your surgery. Your primary care physician will manage this issue, and we will contact them for further management.  -HYPOKALEMIA: Hypokalemia means low potassium levels in your blood. You are taking potassium supplements, and you will need to follow up with your primary care physician to monitor and manage your potassium levels.  -IRON DEFICIENCY ANEMIA: Iron deficiency anemia means you have low iron levels in your blood. You are receiving iron infusions, and your primary care physician will continue to manage this treatment. We will also request that your potassium levels be checked during your next visit.  -WEIGHT LOSS: You have lost weight due to poor appetite and dietary intake during your illness. You are on a high-protein diet and taking Boost supplements to help regain weight. Continue with this diet  and the supplements.  INSTRUCTIONS:  Please take your oral cefadroxil as prescribed: two pills in the morning and two in the evening, with food, for the next 6-10 weeks. Follow up with your primary care physician to monitor your potassium levels and manage your bladder dysfunction. Continue with your iron infusions and high-protein diet with Boost supplements. We will monitor your blood markers, kidney, and liver function every two weeks.

## 2023-06-08 NOTE — Telephone Encounter (Signed)
 Recevied request from Dr.Ravishankar to check potassium level at 3/20 appt. Ok per Dr. Cathie Hoops.   Please add lab prior to venofer on 3/20 and add time for possible IV K

## 2023-06-08 NOTE — Telephone Encounter (Signed)
 Pt states she got a new pulse ox machine she is staying steady at 94/95% and will see [place] next week.

## 2023-06-08 NOTE — Telephone Encounter (Signed)
 Pt states she aspirated last night wants to know what she can do about it

## 2023-06-09 ENCOUNTER — Other Ambulatory Visit: Payer: Self-pay | Admitting: Infectious Diseases

## 2023-06-09 MED ORDER — POTASSIUM CHLORIDE CRYS ER 20 MEQ PO TBCR
20.0000 meq | EXTENDED_RELEASE_TABLET | Freq: Two times a day (BID) | ORAL | 0 refills | Status: DC
Start: 2023-06-09 — End: 2023-08-25

## 2023-06-09 NOTE — Progress Notes (Signed)
 K= 3.0 Sent K 20me BID for 1 week to the pharmacy- informed patient

## 2023-06-10 ENCOUNTER — Inpatient Hospital Stay

## 2023-06-10 ENCOUNTER — Telehealth: Payer: Self-pay

## 2023-06-10 ENCOUNTER — Other Ambulatory Visit: Payer: Self-pay

## 2023-06-10 VITALS — BP 100/70 | HR 88 | Temp 98.5°F | Resp 18

## 2023-06-10 DIAGNOSIS — E876 Hypokalemia: Secondary | ICD-10-CM

## 2023-06-10 DIAGNOSIS — D509 Iron deficiency anemia, unspecified: Secondary | ICD-10-CM | POA: Diagnosis not present

## 2023-06-10 DIAGNOSIS — D500 Iron deficiency anemia secondary to blood loss (chronic): Secondary | ICD-10-CM

## 2023-06-10 LAB — POTASSIUM: Potassium: 2.7 mmol/L — CL (ref 3.5–5.1)

## 2023-06-10 LAB — MAGNESIUM: Magnesium: 1.4 mg/dL — ABNORMAL LOW (ref 1.7–2.4)

## 2023-06-10 MED ORDER — MAGNESIUM SULFATE 4 GM/100ML IV SOLN
4.0000 g | Freq: Once | INTRAVENOUS | Status: AC
  Administered 2023-06-10: 4 g via INTRAVENOUS
  Filled 2023-06-10: qty 100

## 2023-06-10 MED ORDER — SODIUM CHLORIDE 0.9 % IV SOLN
INTRAVENOUS | Status: DC
  Filled 2023-06-10: qty 250

## 2023-06-10 MED ORDER — SODIUM CHLORIDE 0.9% FLUSH
10.0000 mL | Freq: Once | INTRAVENOUS | Status: AC | PRN
  Administered 2023-06-10: 10 mL
  Filled 2023-06-10: qty 10

## 2023-06-10 MED ORDER — POTASSIUM CHLORIDE CRYS ER 20 MEQ PO TBCR
20.0000 meq | EXTENDED_RELEASE_TABLET | Freq: Once | ORAL | Status: AC
Start: 2023-06-10 — End: 2023-06-10
  Administered 2023-06-10: 20 meq via ORAL
  Filled 2023-06-10: qty 1

## 2023-06-10 MED ORDER — IRON SUCROSE 20 MG/ML IV SOLN
200.0000 mg | Freq: Once | INTRAVENOUS | Status: AC
  Administered 2023-06-10: 200 mg via INTRAVENOUS
  Filled 2023-06-10: qty 10

## 2023-06-10 MED ORDER — POTASSIUM CHLORIDE 20 MEQ/100ML IV SOLN
20.0000 meq | Freq: Once | INTRAVENOUS | Status: AC
  Administered 2023-06-10: 20 meq via INTRAVENOUS

## 2023-06-10 NOTE — Telephone Encounter (Signed)
 Lab / +/- K and +/- mag appts have been added to next weeks appt.

## 2023-06-10 NOTE — Telephone Encounter (Signed)
 Received critical lab from [facility]  Potassium 2.7.   MD notified and pt scheduled for IV K.

## 2023-06-10 NOTE — Patient Instructions (Signed)

## 2023-06-15 ENCOUNTER — Ambulatory Visit
Admission: RE | Admit: 2023-06-15 | Discharge: 2023-06-15 | Disposition: A | Discharge status: Home / Self Care | Attending: Nurse Practitioner | Admitting: Nurse Practitioner

## 2023-06-15 ENCOUNTER — Encounter: Payer: Self-pay | Admitting: Nurse Practitioner

## 2023-06-15 ENCOUNTER — Telehealth: Payer: Self-pay | Admitting: Neurosurgery

## 2023-06-15 ENCOUNTER — Ambulatory Visit
Admission: RE | Admit: 2023-06-15 | Discharge: 2023-06-15 | Disposition: A | Discharge status: Home / Self Care | Source: Ambulatory Visit | Attending: Nurse Practitioner

## 2023-06-15 ENCOUNTER — Ambulatory Visit (INDEPENDENT_AMBULATORY_CARE_PROVIDER_SITE_OTHER): Admitting: Nurse Practitioner

## 2023-06-15 VITALS — BP 126/70 | HR 104 | Temp 98.4°F | Resp 16 | Ht 63.0 in | Wt 122.0 lb

## 2023-06-15 DIAGNOSIS — R3981 Functional urinary incontinence: Secondary | ICD-10-CM | POA: Diagnosis not present

## 2023-06-15 DIAGNOSIS — T17908A Unspecified foreign body in respiratory tract, part unspecified causing other injury, initial encounter: Secondary | ICD-10-CM

## 2023-06-15 DIAGNOSIS — R0602 Shortness of breath: Secondary | ICD-10-CM

## 2023-06-15 DIAGNOSIS — R051 Acute cough: Secondary | ICD-10-CM | POA: Insufficient documentation

## 2023-06-15 DIAGNOSIS — R159 Full incontinence of feces: Secondary | ICD-10-CM

## 2023-06-15 DIAGNOSIS — M5412 Radiculopathy, cervical region: Secondary | ICD-10-CM

## 2023-06-15 DIAGNOSIS — Z981 Arthrodesis status: Secondary | ICD-10-CM

## 2023-06-15 DIAGNOSIS — E876 Hypokalemia: Secondary | ICD-10-CM | POA: Diagnosis not present

## 2023-06-15 DIAGNOSIS — G992 Myelopathy in diseases classified elsewhere: Secondary | ICD-10-CM

## 2023-06-15 DIAGNOSIS — M4802 Spinal stenosis, cervical region: Secondary | ICD-10-CM

## 2023-06-15 DIAGNOSIS — F418 Other specified anxiety disorders: Secondary | ICD-10-CM

## 2023-06-15 MED ORDER — CLONAZEPAM 1 MG PO TABS: 0.5000 mg | ORAL_TABLET | Freq: Two times a day (BID) | ORAL | 0 refills | Status: AC | PRN

## 2023-06-15 MED ORDER — OXYBUTYNIN CHLORIDE ER 10 MG PO TB24: 10.0000 mg | ORAL_TABLET | Freq: Every day | ORAL | 5 refills | Status: DC

## 2023-06-15 MED ORDER — HYDROCODONE-ACETAMINOPHEN 5-325 MG PO TABS: 1.0000 | ORAL_TABLET | Freq: Two times a day (BID) | ORAL | 0 refills | Status: DC | PRN

## 2023-06-15 NOTE — Progress Notes (Signed)
 [facility] [address] [place], [place] [address]  Internal MEDICINE  Office Visit Note  Patient Name: Summer Murphy  [id number]  [id number]  Date of Service: 06/15/2023  Chief Complaint  Patient presents with   Diabetes   Gastroesophageal Reflux   Hypertension   Follow-up    Potassium issues, coughing up yellow mucus     HPI Summer Murphy presents for a follow-up visit for hypokalemia, cough, neck pain and incontinence. Low potassium -- taking potassium supplement prescription for a week then will recheck labs in 2 days this week.  Aspirated about 1 week ago -- feeling SOB and coughing up yellow mucous  Chronic neck and back pain -- taking hydrocodone  as needed  Urinary incontinence and fecal incontinence   Current Medication: Outpatient Encounter Medications as of 06/15/2023  Medication Sig Note   acetaminophen  (TYLENOL ) 500 MG tablet Take 2 tablets (1,000 mg total) by mouth every 6 (six) hours as needed. 04/22/2023: prn   alendronate  (FOSAMAX ) 70 MG tablet Take 1 tablet (70 mg total) by mouth once a week. Take with a full glass of water  on an empty stomach.    apixaban  (ELIQUIS ) 5 MG TABS tablet Take 1 tablet (5 mg total) by mouth 2 (two) times daily.    busPIRone  (BUSPAR ) 10 MG tablet Take 1 tablet (10 mg total) by mouth 2 (two) times daily.    cefadroxil  (DURICEF) 500 MG capsule Take 2 capsules (1,000 mg total) by mouth 2 (two) times daily.    COMBIVENT  RESPIMAT 20-100 MCG/ACT AERS respimat 1 puff every 6 (six) hours.    diclofenac  Sodium (VOLTAREN ) 1 % GEL Apply 4 g topically 4 (four) times daily. (Patient taking differently: Apply 4 g topically 4 (four) times daily as needed (pain).) 04/22/2023: prn   DULoxetine  (CYMBALTA ) 60 MG capsule Take 1 capsule (60 mg total) by mouth at bedtime.    Fe Fum-Vit C-Vit B12-FA (TRIGELS-F FORTE) CAPS capsule Take 1 capsule by mouth 2 (two) times daily.    fluticasone -salmeterol (WIXELA INHUB) 250-50 MCG/ACT AEPB Inhale 1 puff  into the lungs in the morning and at bedtime.    gabapentin  (NEURONTIN ) 300 MG capsule Take 1 capsule (300 mg total) by mouth 2 (two) times daily.    ipratropium-albuterol  (DUONEB) 0.5-2.5 (3) MG/3ML SOLN Take 3 mLs by nebulization every 6 (six) hours as needed.    methocarbamol  (ROBAXIN ) 750 MG tablet Take 1 tablet (750 mg total) by mouth every 8 (eight) hours as needed for muscle spasms.    montelukast  (SINGULAIR ) 10 MG tablet Take 1 tablet (10 mg total) by mouth at bedtime.    ondansetron  (ZOFRAN ) 4 MG tablet TAKE 1 TABLET BY MOUTH TWICE DAILY AS NEEDED    oxybutynin  (DITROPAN -XL) 10 MG 24 hr tablet Take 1 tablet (10 mg total) by mouth at bedtime.    pantoprazole  (PROTONIX ) 40 MG tablet Take 1 tablet (40 mg total) by mouth 2 (two) times daily.    polyethylene glycol (MIRALAX  / GLYCOLAX ) 17 g packet Take 17 g by mouth 2 (two) times daily.    potassium chloride  SA (KLOR-CON  M) 20 MEQ tablet Take 1 tablet (20 mEq total) by mouth 2 (two) times daily for 7 days.    rOPINIRole  (REQUIP ) 1 MG tablet Take 1 tablet (1 mg total) by mouth at bedtime.    senna (SENOKOT) 8.6 MG TABS tablet Take 1 tablet (8.6 mg total) by mouth 2 (two) times daily as needed for mild constipation. 04/22/2023: prn   SUMAtriptan  (IMITREX ) 100  MG tablet Take 1 tablet (100 mg total) by mouth every 2 (two) hours as needed (for migraine headaches.). May repeat in 1 hours if headache persists or recurs.    traZODone  (DESYREL ) 150 MG tablet TAKE 1 TABLET(150 MG) BY MOUTH AT BEDTIME    [DISCONTINUED] clonazePAM  (KLONOPIN ) 1 MG tablet Take 0.5-1 tablets (0.5-1 mg total) by mouth 2 (two) times daily as needed for anxiety.    clonazePAM  (KLONOPIN ) 1 MG tablet Take 0.5-1 tablets (0.5-1 mg total) by mouth 2 (two) times daily as needed for anxiety.    HYDROcodone -acetaminophen  (NORCO/VICODIN) 5-325 MG tablet Take 1 tablet by mouth 2 (two) times daily as needed for severe pain (pain score 7-10).    No facility-administered encounter medications  on file as of 06/15/2023.    Surgical History: Past Surgical History:  Procedure Laterality Date   ABDOMINAL HYSTERECTOMY     AMPUTATION TOE Left 07/31/2016   Procedure: AMPUTATION TOE/MPJ 2nd toe;  Surgeon: Angel Barba, DPM;  Location: [facility] ORS;  Service: Podiatry;  Laterality: Left;   APPENDECTOMY  1990   BACK SURGERY     low back   BREAST SURGERY     bilateral breast reduction   CARDIAC ELECTROPHYSIOLOGY STUDY AND ABLATION     CERVICAL WOUND DEBRIDEMENT N/A 04/17/2023   Procedure: CERVICAL WOUND DEBRIDEMENT;  Surgeon: Berta Brittle, MD;  Location: [facility];  Service: Neurosurgery;  Laterality: N/A;   CHOLECYSTECTOMY  1990   COLONOSCOPY WITH PROPOFOL  N/A 01/04/2017   Procedure: COLONOSCOPY WITH PROPOFOL ;  Surgeon: Cassie Click, MD;  Location: [facility] END[facility]COPY;  Service: Endoscopy;  Laterality: N/A;   CORNEAL TRANSPLANT     ESOPHAGOGASTRODUODENOSCOPY N/A 04/09/2021   Procedure: ESOPHAGOGASTRODUODENOSCOPY (EGD);  Surgeon: Selena Daily, MD;  Location: [facility] ENDOSCOPY;  Service: Gastroenterology;  Laterality: N/A;   ESOPHAGOGASTRODUODENOSCOPY (EGD) WITH PROPOFOL  N/A 01/04/2017   Procedure: ESOPHAGOGASTRODUODENOSCOPY (EGD) WITH PROPOFOL ;  Surgeon: Cassie Click, MD;  Location: [facility] ENDOSCOPY;  Service: Endoscopy;  Laterality: N/A;   EXCISION BONE CYST Left 07/31/2016   Procedure: EXCISION BONE CYST/exostectomy 28124/left 2nd;  Surgeon: Angel Barba, DPM;  Location: [facility];  Service: Podiatry;  Laterality: Left;   EXTRACORPOREAL SHOCK WAVE LITHOTRIPSY Left 09/12/2015   Procedure: EXTRACORPOREAL SHOCK WAVE LITHOTRIPSY (ESWL);  Surgeon: Dustin Gimenez, MD;  Location: [facility] ORS;  Service: Urology;  Laterality: Left;   FRACTURE SURGERY     left foot   GUM SURGERY Left    gum infection   HARDWARE REMOVAL Left 11/14/2020   Procedure: HARDWARE REMOVAL;  Surgeon: Molli Angelucci, MD;  Location: [facility] ORS;  Service: Orthopedics;  Laterality: Left;   HH repair     Fundoplication   JOINT  REPLACEMENT Bilateral 2013,2014   total knees   LAPAROSCOPIC HYSTERECTOMY     LITHOTRIPSY     periprosthetic supracondylar fracture of left femur  02/16/2020   [facility]   POSTERIOR CERVICAL FUSION/FORAMINOTOMY N/A 03/02/2023   Procedure: C3-6 POSTERIOR CERVICAL LAMINECTOMY AND FUSION;  Surgeon: Carroll Clamp, MD;  Location: [facility] ORS;  Service: Neurosurgery;  Laterality: N/A;   TONSILLECTOMY     TOTAL KNEE REVISION Right 11/14/2019   Procedure: Revision patella and tibial polyethylene;  Surgeon: Molli Angelucci, MD;  Location: [facility] ORS;  Service: Orthopedics;  Laterality: Right;   URETEROSCOPY      Medical History: Past Medical History:  Diagnosis Date   Acid reflux    Anemia    Anxiety    a.) buspirone  BID + BZO PRN (clonazepam )   Arthritis  Asthma    Cervical disc disorder with radiculopathy of cervical region    Chronic pain syndrome    a.) on COT as prescribed by pain mangement   Chronic, continuous use of opioids    a.) followed by pain mangement   COPD (chronic obstructive pulmonary disease) (HCC)    DOE (dyspnea on exertion)    Hematuria    Hiatal hernia    History of kidney stones    Hypertension    Hypoglycemia    Insomnia    a.) uses Trazodone  PRN   LBBB (left bundle branch block)    Migraine    Murmur    Osteopenia    Palpitations    Pre-diabetes    Restless leg    a.) on ropinirole    Stenosis of cervical spine with myelopathy (HCC)    T2DM (type 2 diabetes mellitus) (HCC)     Family History: Family History  Problem Relation Age of Onset   Hypertension Mother    Stroke Mother    Prostate cancer Father    Kidney Stones Father    Diabetes Brother    Hypertension Brother    Breast cancer Maternal Aunt    Kidney disease Neg Hx     Social History   Socioeconomic History   Marital status: Widowed    Spouse name: Not on file   Number of children: Not on file   Years of education: Not on file   Highest education level: Not on file   Occupational History   Not on file  Tobacco Use   Smoking status: Never    Passive exposure: Past   Smokeless tobacco: Never  Vaping Use   Vaping status: Never Used  Substance and Sexual Activity   Alcohol use: No   Drug use: No   Sexual activity: Not Currently  Other Topics Concern   Not on file  Social History Narrative   Lives at home alone. Relatives are the support person.    Social Drivers of Health   Financial Resource Strain: Medium Risk (05/27/2023)   Received from [facility]   Overall Financial Resource Strain (CARDIA)    Difficulty of Paying Living Expenses: Somewhat hard  Food Insecurity: Food Insecurity Present (05/27/2023)   Received from [facility]   Hunger Vital Sign    Worried About Running Out of Food in the Last Year: Never true    Ran Out of Food in the Last Year: Sometimes true  Transportation Needs: No Transportation Needs (05/27/2023)   Received from [facility] - Transportation    In the past 12 months, has lack of transportation kept you from medical appointments or from getting medications?: No    Lack of Transportation (Non-Medical): No  Physical Activity: Not on file  Stress: Not on file  Social Connections: Moderately Isolated (04/16/2023)   Social Connection and Isolation Panel [NHANES]    Frequency of Communication with Friends and Family: More than three times a week    Frequency of Social Gatherings with Friends and Family: Once a week    Attends Religious Services: More than 4 times per year    Active Member of [employer] or Organizations: No    Attends Banker Meetings: Never    Marital Status: Widowed  Intimate Partner Violence: Not At Risk (04/16/2023)   Humiliation, Afraid, Rape, and Kick questionnaire    Fear of Current or Ex-Partner: No    Emotionally Abused: No  Physically Abused: No    Sexually Abused: No      Review of Systems  Constitutional:   Positive for fatigue. Negative for fever.  HENT:  Negative for congestion, mouth sores and postnasal drip.   Respiratory:  Positive for cough and shortness of breath. Negative for chest tightness and wheezing.   Cardiovascular: Negative.  Negative for chest pain and palpitations.  Genitourinary:  Negative for flank pain.  Musculoskeletal:  Positive for arthralgias, back pain and neck pain.  Neurological:        Forgetfulness  Psychiatric/Behavioral:  Positive for behavioral problems and sleep disturbance. Negative for self-injury and suicidal ideas. The patient is nervous/anxious.     Vital Signs: BP 126/70   Pulse (!) 104   Temp 98.4 F (36.9 C)   Resp 16   Ht 5\' 3"  (1.6 m)   Wt 122 lb (55.3 kg)   SpO2 96%   BMI 21.61 kg/m    Physical Exam Vitals reviewed.  Constitutional:      General: She is not in acute distress.    Appearance: Normal appearance. She is normal weight. She is not ill-appearing.  HENT:     Head: Normocephalic and atraumatic.  Eyes:     Pupils: Pupils are equal, round, and reactive to light.  Cardiovascular:     Rate and Rhythm: Normal rate and regular rhythm.  Pulmonary:     Effort: Pulmonary effort is normal. No respiratory distress.  Neurological:     Mental Status: She is alert and oriented to person, place, and time.  Psychiatric:        Mood and Affect: Mood normal.        Behavior: Behavior normal.        Assessment/Plan: 1. Acute cough (Primary) Chest xray ordered to rule out pneumonia - DG Chest 2 View; Future  2. Aspiration into airway, initial encounter Chest xray ordered to rule out pneumonia  - DG Chest 2 View; Future  3. SOB (shortness of breath) Chest xray ordered to rule out pneumonia - DG Chest 2 View; Future  4. Hypokalemia Potassium level is in normal range. Continue potassium supplement  5. Functional urinary incontinence Will try oxybutynin  to control the incontinence.  - oxybutynin  (DITROPAN -XL) 10 MG 24 hr  tablet; Take 1 tablet (10 mg total) by mouth at bedtime.  Dispense: 30 tablet; Refill: 5  6. Incontinence of feces, unspecified fecal incontinence type Will see if the oxybutynin  helps with the fecal incontinence as well.   7. Cervical radicular pain (left severe) Continue hydrocodone  as needed  - HYDROcodone -acetaminophen  (NORCO/VICODIN) 5-325 MG tablet; Take 1 tablet by mouth 2 (two) times daily as needed for severe pain (pain score 7-10).  Dispense: 45 tablet; Refill: 0  8. Foraminal stenosis of cervical region (severe b/l C5/6; C6/7) Continue hydrocodone  as needed  - HYDROcodone -acetaminophen  (NORCO/VICODIN) 5-325 MG tablet; Take 1 tablet by mouth 2 (two) times daily as needed for severe pain (pain score 7-10).  Dispense: 45 tablet; Refill: 0  9. Spinal stenosis in cervical region Continue hydrocodone  as needed.  - HYDROcodone -acetaminophen  (NORCO/VICODIN) 5-325 MG tablet; Take 1 tablet by mouth 2 (two) times daily as needed for severe pain (pain score 7-10).  Dispense: 45 tablet; Refill: 0  10. Situational anxiety Continue clonazepam  as needed.  - clonazePAM  (KLONOPIN ) 1 MG tablet; Take 0.5-1 tablets (0.5-1 mg total) by mouth 2 (two) times daily as needed for anxiety.  Dispense: 60 tablet; Refill: 0   General Counseling: Keven verbalizes understanding of  the findings of todays visit and agrees with plan of treatment. I have discussed any further diagnostic evaluation that may be needed or ordered today. We also reviewed her medications today. she has been encouraged to call the office with any questions or concerns that should arise related to todays visit.    Orders Placed This Encounter  Procedures   DG Chest 2 View    Meds ordered this encounter  Medications   HYDROcodone -acetaminophen  (NORCO/VICODIN) 5-325 MG tablet    Sig: Take 1 tablet by mouth 2 (two) times daily as needed for severe pain (pain score 7-10).    Dispense:  45 tablet    Refill:  0    Refill please    clonazePAM  (KLONOPIN ) 1 MG tablet    Sig: Take 0.5-1 tablets (0.5-1 mg total) by mouth 2 (two) times daily as needed for anxiety.    Dispense:  60 tablet    Refill:  0    Note change in dose and number of tablets, please fill new script today.   oxybutynin  (DITROPAN -XL) 10 MG 24 hr tablet    Sig: Take 1 tablet (10 mg total) by mouth at bedtime.    Dispense:  30 tablet    Refill:  5    Fill new script today    Return in about 3 weeks (around 07/06/2023) for F/U, Hau Sanor PCP, eval new med and discuss potassium and magnesium  .   Total time spent:30 Minutes Time spent includes review of chart, medications, test results, and follow up plan with the patient.   [place] Controlled Substance Database was reviewed by me.  This patient was seen by Laurence Pons, FNP-C in collaboration with Dr. Verneta Gone as a part of collaborative care agreement.   Harvey Matlack R. Bobbi Burow, MSN, FNP-C Internal medicine

## 2023-06-15 NOTE — Telephone Encounter (Signed)
 Patient is calling to follow up on the status of getting a TENS unit.

## 2023-06-15 NOTE — Telephone Encounter (Signed)
 Order and clinicals faxed to [facility] for [employer]nex NexWave TENS unit.   Left voicemail for patient to call back with any questions. I made her aware to keep an eye out for a phone call from [employer] with information about her device.

## 2023-06-16 ENCOUNTER — Ambulatory Visit (INDEPENDENT_AMBULATORY_CARE_PROVIDER_SITE_OTHER): Admitting: Student in an Organized Health Care Education/Training Program

## 2023-06-16 ENCOUNTER — Other Ambulatory Visit: Payer: Self-pay

## 2023-06-16 ENCOUNTER — Encounter: Payer: Self-pay | Admitting: Student in an Organized Health Care Education/Training Program

## 2023-06-16 VITALS — BP 108/64 | HR 101 | Resp 18 | Ht 63.0 in | Wt 118.4 lb

## 2023-06-16 DIAGNOSIS — E876 Hypokalemia: Secondary | ICD-10-CM

## 2023-06-16 DIAGNOSIS — J4541 Moderate persistent asthma with (acute) exacerbation: Secondary | ICD-10-CM | POA: Diagnosis not present

## 2023-06-16 NOTE — Progress Notes (Unsigned)
 Synopsis: Referred in *** by [name], NP  Assessment & Plan:   There are no diagnoses linked to this encounter.  Increased shortness of breath, wheeze. Will add dupixant instead of stepping up therapy.  Return in about 3 months (around 09/16/2023).  I spent *** minutes caring for this patient today, including {EM billing:[id number]}  Raechel Chute, MD  Pulmonary Critical Care 06/16/2023 11:23 AM    End of visit medications:  No orders of the defined types were placed in this encounter.    Current Outpatient Medications:    acetaminophen (TYLENOL) 500 MG tablet, Take 2 tablets (1,000 mg total) by mouth every 6 (six) hours as needed., Disp: 100 tablet, Rfl: 2   alendronate (FOSAMAX) 70 MG tablet, Take 1 tablet (70 mg total) by mouth once a week. Take with a full glass of water on an empty stomach., Disp: 12 tablet, Rfl: 3   apixaban (ELIQUIS) 5 MG TABS tablet, Take 1 tablet (5 mg total) by mouth 2 (two) times daily., Disp: 60 tablet, Rfl: 1   busPIRone (BUSPAR) 10 MG tablet, Take 1 tablet (10 mg total) by mouth 2 (two) times daily., Disp: 60 tablet, Rfl: 2   cefadroxil (DURICEF) 500 MG capsule, Take 2 capsules (1,000 mg total) by mouth 2 (two) times daily., Disp: 120 capsule, Rfl: 1   clonazePAM (KLONOPIN) 1 MG tablet, Take 0.5-1 tablets (0.5-1 mg total) by mouth 2 (two) times daily as needed for anxiety., Disp: 60 tablet, Rfl: 0   COMBIVENT RESPIMAT 20-100 MCG/ACT AERS respimat, 1 puff every 6 (six) hours., Disp: , Rfl:    diclofenac Sodium (VOLTAREN) 1 % GEL, Apply 4 g topically 4 (four) times daily. (Patient taking differently: Apply 4 g topically 4 (four) times daily as needed (pain).), Disp: 350 g, Rfl: 1   DULoxetine (CYMBALTA) 60 MG capsule, Take 1 capsule (60 mg total) by mouth at bedtime., Disp: 90 capsule, Rfl: 1   Fe Fum-Vit C-Vit B12-FA (TRIGELS-F FORTE) CAPS capsule, Take 1 capsule by mouth 2 (two) times daily., Disp: 180 capsule, Rfl: 3    fluticasone-salmeterol (WIXELA INHUB) 250-50 MCG/ACT AEPB, Inhale 1 puff into the lungs in the morning and at bedtime., Disp: 60 each, Rfl: 12   gabapentin (NEURONTIN) 300 MG capsule, Take 1 capsule (300 mg total) by mouth 2 (two) times daily., Disp: 60 capsule, Rfl: 2   HYDROcodone-acetaminophen (NORCO/VICODIN) 5-325 MG tablet, Take 1 tablet by mouth 2 (two) times daily as needed for severe pain (pain score 7-10)., Disp: 45 tablet, Rfl: 0   ipratropium-albuterol (DUONEB) 0.5-2.5 (3) MG/3ML SOLN, Take 3 mLs by nebulization every 6 (six) hours as needed., Disp: , Rfl:    methocarbamol (ROBAXIN) 750 MG tablet, Take 1 tablet (750 mg total) by mouth every 8 (eight) hours as needed for muscle spasms., Disp: 90 tablet, Rfl: 5   montelukast (SINGULAIR) 10 MG tablet, Take 1 tablet (10 mg total) by mouth at bedtime., Disp: 90 tablet, Rfl: 1   ondansetron (ZOFRAN) 4 MG tablet, TAKE 1 TABLET BY MOUTH TWICE DAILY AS NEEDED, Disp: 45 tablet, Rfl: 1   oxybutynin (DITROPAN-XL) 10 MG 24 hr tablet, Take 1 tablet (10 mg total) by mouth at bedtime., Disp: 30 tablet, Rfl: 5   pantoprazole (PROTONIX) 40 MG tablet, Take 1 tablet (40 mg total) by mouth 2 (two) times daily., Disp: 180 tablet, Rfl: 1   polyethylene glycol (MIRALAX / GLYCOLAX) 17 g packet, Take 17 g by mouth 2 (two) times daily., Disp: , Rfl:  potassium chloride SA (KLOR-CON M) 20 MEQ tablet, Take 1 tablet (20 mEq total) by mouth 2 (two) times daily for 7 days., Disp: 14 tablet, Rfl: 0   rOPINIRole (REQUIP) 1 MG tablet, Take 1 tablet (1 mg total) by mouth at bedtime., Disp: 90 tablet, Rfl: 1   senna (SENOKOT) 8.6 MG TABS tablet, Take 1 tablet (8.6 mg total) by mouth 2 (two) times daily as needed for mild constipation., Disp: 30 tablet, Rfl: 0   SUMAtriptan (IMITREX) 100 MG tablet, Take 1 tablet (100 mg total) by mouth every 2 (two) hours as needed (for migraine headaches.). May repeat in 1 hours if headache persists or recurs., Disp: 10 tablet, Rfl: 3    traZODone (DESYREL) 150 MG tablet, TAKE 1 TABLET(150 MG) BY MOUTH AT BEDTIME, Disp: 30 tablet, Rfl: 2   Subjective:   PATIENT ID: Summer Murphy GENDER: female DOB: [birth year], MRN: [id number]  Chief Complaint  Patient presents with   Follow-up    Aspirated 3/17. Breathing stats are good but feels short of breath. Did take breathing treatment this am.     HPI ***  Ancillary information including prior medications, full medical/surgical/family/social histories, and PFTs (when available) are listed below and have been reviewed.   ROS   Objective:   Vitals:   06/16/23 1102  BP: 108/64  Pulse: (!) 101  Resp: 18  SpO2: 97%  Weight: 118 lb 6.4 oz (53.7 kg)  Height: 5\' 3"  (1.6 m)   97% on *** LPM *** RA BMI Readings from Last 3 Encounters:  06/16/23 20.97 kg/m  06/15/23 21.61 kg/m  05/31/23 21.08 kg/m   Wt Readings from Last 3 Encounters:  06/16/23 118 lb 6.4 oz (53.7 kg)  06/15/23 122 lb (55.3 kg)  05/31/23 119 lb (54 kg)    Physical Exam    Ancillary Information    Past Medical History:  Diagnosis Date   Acid reflux    Anemia    Anxiety    a.) buspirone BID + BZO PRN (clonazepam)   Arthritis    Asthma    Cervical disc disorder with radiculopathy of cervical region    Chronic pain syndrome    a.) on COT as prescribed by pain mangement   Chronic, continuous use of opioids    a.) followed by pain mangement   COPD (chronic obstructive pulmonary disease) (HCC)    DOE (dyspnea on exertion)    Hematuria    Hiatal hernia    History of kidney stones    Hypertension    Hypoglycemia    Insomnia    a.) uses Trazodone PRN   LBBB (left bundle branch block)    Migraine    Murmur    Osteopenia    Palpitations    Pre-diabetes    Restless leg    a.) on ropinirole   Stenosis of cervical spine with myelopathy (HCC)    T2DM (type 2 diabetes mellitus) (HCC)      Family History  Problem Relation Age of Onset   Hypertension Mother    Stroke Mother     Prostate cancer Father    Kidney Stones Father    Diabetes Brother    Hypertension Brother    Breast cancer Maternal Aunt    Kidney disease Neg Hx      Past Surgical History:  Procedure Laterality Date   ABDOMINAL HYSTERECTOMY     AMPUTATION TOE Left 07/31/2016   Procedure: AMPUTATION TOE/MPJ 2nd toe;  Surgeon: Linus Galas, DPM;  Location:  [facility];  Service: Podiatry;  Laterality: Left;   APPENDECTOMY  1990   BACK SURGERY     low back   BREAST SURGERY     bilateral breast reduction   CARDIAC ELECTROPHYSIOLOGY STUDY AND ABLATION     CERVICAL WOUND DEBRIDEMENT N/A 04/17/2023   Procedure: CERVICAL WOUND DEBRIDEMENT;  Surgeon: Lucy Chris, MD;  Location: [facility];  Service: Neurosurgery;  Laterality: N/A;   CHOLECYSTECTOMY  1990   COLONOSCOPY WITH PROPOFOL N/A 01/04/2017   Procedure: COLONOSCOPY WITH PROPOFOL;  Surgeon: Scot Jun, MD;  Location: [facility] ENDOSCOPY;  Service: Endoscopy;  Laterality: N/A;   CORNEAL TRANSPLANT     ESOPHAGOGASTRODUODENOSCOPY N/A 04/09/2021   Procedure: ESOPHAGOGASTRODUODENOSCOPY (EGD);  Surgeon: Toney Reil, MD;  Location: [facility] ENDOSCOPY;  Service: Gastroenterology;  Laterality: N/A;   ESOPHAGOGASTRODUODENOSCOPY (EGD) WITH PROPOFOL N/A 01/04/2017   Procedure: ESOPHAGOGASTRODUODENOSCOPY (EGD) WITH PROPOFOL;  Surgeon: Scot Jun, MD;  Location: [facility] ENDOSCOPY;  Service: Endoscopy;  Laterality: N/A;   EXCISION BONE CYST Left 07/31/2016   Procedure: EXCISION BONE CYST/exostectomy 28124/left 2nd;  Surgeon: Linus Galas, DPM;  Location: [facility];  Service: Podiatry;  Laterality: Left;   EXTRACORPOREAL SHOCK WAVE LITHOTRIPSY Left 09/12/2015   Procedure: EXTRACORPOREAL SHOCK WAVE LITHOTRIPSY (ESWL);  Surgeon: Vanna Scotland, MD;  Location: [facility];  Service: Urology;  Laterality: Left;   FRACTURE SURGERY     left foot   GUM SURGERY Left    gum infection   HARDWARE REMOVAL Left 11/14/2020   Procedure: HARDWARE REMOVAL;  Surgeon: Kennedy Bucker, MD;  Location: [facility] ORS;  Service: Orthopedics;  Laterality: Left;   HH repair     Fundoplication   JOINT REPLACEMENT Bilateral 2013,2014   total knees   LAPAROSCOPIC HYSTERECTOMY     LITHOTRIPSY     periprosthetic supracondylar fracture of left femur  02/16/2020   [facility]   POSTERIOR CERVICAL FUSION/FORAMINOTOMY N/A 03/02/2023   Procedure: C3-6 POSTERIOR CERVICAL LAMINECTOMY AND FUSION;  Surgeon: Lovenia Kim, MD;  Location: [facility] ORS;  Service: Neurosurgery;  Laterality: N/A;   TONSILLECTOMY     TOTAL KNEE REVISION Right 11/14/2019   Procedure: Revision patella and tibial polyethylene;  Surgeon: Kennedy Bucker, MD;  Location: [facility] ORS;  Service: Orthopedics;  Laterality: Right;   URETEROSCOPY      Social History   Socioeconomic History   Marital status: Widowed    Spouse name: Not on file   Number of children: Not on file   Years of education: Not on file   Highest education level: Not on file  Occupational History   Not on file  Tobacco Use   Smoking status: Never    Passive exposure: Past   Smokeless tobacco: Never  Vaping Use   Vaping status: Never Used  Substance and Sexual Activity   Alcohol use: No   Drug use: No   Sexual activity: Not Currently  Other Topics Concern   Not on file  Social History Narrative   Lives at home alone. Relatives are the support person.    Social Drivers of Health   Financial Resource Strain: Medium Risk (05/27/2023)   Received [facility]   Overall Financial Resource Strain (CARDIA)    Difficulty of Paying Living Expenses: Somewhat hard  Food Insecurity: Food Insecurity Present (05/27/2023)   Received from [facility]   Hunger Vital Sign    Worried About Running Out of Food in the Last Year: Never true    Ran Out of  Food in the Last Year: Sometimes true  Transportation Needs: No Transportation Needs (05/27/2023)   Received from [facility] -  Transportation    In the past 12 months, has lack of transportation kept you from medical appointments or from getting medications?: No    Lack of Transportation (Non-Medical): No  Physical Activity: Not on file  Stress: Not on file  Social Connections: Moderately Isolated (04/16/2023)   Social Connection and Isolation Panel [NHANES]    Frequency of Communication with Friends and Family: More than three times a week    Frequency of Social Gatherings with Friends and Family: Once a week    Attends Religious Services: More than 4 times per year    Active Member of [employer] or Organizations: No    Attends Banker Meetings: Never    Marital Status: Widowed  Intimate Partner Violence: Not At Risk (04/16/2023)   Humiliation, Afraid, Rape, and Kick questionnaire    Fear of Current or Ex-Partner: No    Emotionally Abused: No    Physically Abused: No    Sexually Abused: No     Allergies  Allergen Reactions   Librium [Chlordiazepoxide] Shortness Of Breath   Reglan [Metoclopramide] Hives and Other (See Comments)    hallucinations    Vanilla Shortness Of Breath    Pt reports allergy to vanilla extract only   Aspirin Hives   Nsaids Rash    Rash/flares asthma issues.   Azithromycin Other (See Comments)    Unsure of what the reaction was.   Buprenorphine Hcl Other (See Comments)    Unsure of what the reaction was.    Flexeril [Cyclobenzaprine]     hallucinations   Morphine Other (See Comments)    Unsure of reaction   Sulfa Antibiotics     Other reaction(s): Unknown   Tolmetin     Other Reaction: Allergy   Amlodipine Besylate Itching and Rash    arms, stomach and forehead   Iron Nausea And Vomiting     CBC    Component Value Date/Time   WBC 10.4 06/08/2023 1228   RBC 3.83 (L) 06/08/2023 1228   HGB 9.9 (L) 06/08/2023 1228   HGB 7.8 (L) 05/25/2023 1529   HGB 13.4 08/29/2021 1303   HCT 32.4 (L) 06/08/2023 1228   HCT 39.4 08/29/2021 1303   PLT 372 06/08/2023 1228    PLT 378 05/25/2023 1529   PLT 186 08/29/2021 1303   MCV 84.6 06/08/2023 1228   MCV 91 08/29/2021 1303   MCV 84 06/28/2014 1710   MCH 25.8 (L) 06/08/2023 1228   MCHC 30.6 06/08/2023 1228   RDW 23.7 (H) 06/08/2023 1228   RDW 12.6 08/29/2021 1303   RDW 16.7 (H) 06/28/2014 1710   LYMPHSABS 1.3 06/08/2023 1228   LYMPHSABS 2.0 08/29/2021 1303   LYMPHSABS 0.6 (L) 06/13/2013 0505   MONOABS 0.6 06/08/2023 1228   MONOABS 0.1 (L) 06/13/2013 0505   EOSABS 0.2 06/08/2023 1228   EOSABS 0.2 08/29/2021 1303   EOSABS 0.0 06/13/2013 0505   BASOSABS 0.1 06/08/2023 1228   BASOSABS 0.0 08/29/2021 1303   BASOSABS 0.0 06/13/2013 0505    Pulmonary Functions Testing Results:     No data to display          Outpatient Medications Prior to Visit  Medication Sig Dispense Refill   acetaminophen (TYLENOL) 500 MG tablet Take 2 tablets (1,000 mg total) by mouth every 6 (six) hours as needed. 100 tablet 2  alendronate (FOSAMAX) 70 MG tablet Take 1 tablet (70 mg total) by mouth once a week. Take with a full glass of water on an empty stomach. 12 tablet 3   apixaban (ELIQUIS) 5 MG TABS tablet Take 1 tablet (5 mg total) by mouth 2 (two) times daily. 60 tablet 1   busPIRone (BUSPAR) 10 MG tablet Take 1 tablet (10 mg total) by mouth 2 (two) times daily. 60 tablet 2   cefadroxil (DURICEF) 500 MG capsule Take 2 capsules (1,000 mg total) by mouth 2 (two) times daily. 120 capsule 1   clonazePAM (KLONOPIN) 1 MG tablet Take 0.5-1 tablets (0.5-1 mg total) by mouth 2 (two) times daily as needed for anxiety. 60 tablet 0   COMBIVENT RESPIMAT 20-100 MCG/ACT AERS respimat 1 puff every 6 (six) hours.     diclofenac Sodium (VOLTAREN) 1 % GEL Apply 4 g topically 4 (four) times daily. (Patient taking differently: Apply 4 g topically 4 (four) times daily as needed (pain).) 350 g 1   DULoxetine (CYMBALTA) 60 MG capsule Take 1 capsule (60 mg total) by mouth at bedtime. 90 capsule 1   Fe Fum-Vit C-Vit B12-FA (TRIGELS-F FORTE) CAPS  capsule Take 1 capsule by mouth 2 (two) times daily. 180 capsule 3   fluticasone-salmeterol (WIXELA INHUB) 250-50 MCG/ACT AEPB Inhale 1 puff into the lungs in the morning and at bedtime. 60 each 12   gabapentin (NEURONTIN) 300 MG capsule Take 1 capsule (300 mg total) by mouth 2 (two) times daily. 60 capsule 2   HYDROcodone-acetaminophen (NORCO/VICODIN) 5-325 MG tablet Take 1 tablet by mouth 2 (two) times daily as needed for severe pain (pain score 7-10). 45 tablet 0   ipratropium-albuterol (DUONEB) 0.5-2.5 (3) MG/3ML SOLN Take 3 mLs by nebulization every 6 (six) hours as needed.     methocarbamol (ROBAXIN) 750 MG tablet Take 1 tablet (750 mg total) by mouth every 8 (eight) hours as needed for muscle spasms. 90 tablet 5   montelukast (SINGULAIR) 10 MG tablet Take 1 tablet (10 mg total) by mouth at bedtime. 90 tablet 1   ondansetron (ZOFRAN) 4 MG tablet TAKE 1 TABLET BY MOUTH TWICE DAILY AS NEEDED 45 tablet 1   oxybutynin (DITROPAN-XL) 10 MG 24 hr tablet Take 1 tablet (10 mg total) by mouth at bedtime. 30 tablet 5   pantoprazole (PROTONIX) 40 MG tablet Take 1 tablet (40 mg total) by mouth 2 (two) times daily. 180 tablet 1   polyethylene glycol (MIRALAX / GLYCOLAX) 17 g packet Take 17 g by mouth 2 (two) times daily.     potassium chloride SA (KLOR-CON M) 20 MEQ tablet Take 1 tablet (20 mEq total) by mouth 2 (two) times daily for 7 days. 14 tablet 0   rOPINIRole (REQUIP) 1 MG tablet Take 1 tablet (1 mg total) by mouth at bedtime. 90 tablet 1   senna (SENOKOT) 8.6 MG TABS tablet Take 1 tablet (8.6 mg total) by mouth 2 (two) times daily as needed for mild constipation. 30 tablet 0   SUMAtriptan (IMITREX) 100 MG tablet Take 1 tablet (100 mg total) by mouth every 2 (two) hours as needed (for migraine headaches.). May repeat in 1 hours if headache persists or recurs. 10 tablet 3   traZODone (DESYREL) 150 MG tablet TAKE 1 TABLET(150 MG) BY MOUTH AT BEDTIME 30 tablet 2   No facility-administered medications  prior to visit.

## 2023-06-17 ENCOUNTER — Inpatient Hospital Stay

## 2023-06-17 ENCOUNTER — Telehealth: Payer: Self-pay | Admitting: Pharmacist

## 2023-06-17 VITALS — BP 117/67 | HR 102 | Resp 16

## 2023-06-17 DIAGNOSIS — E876 Hypokalemia: Secondary | ICD-10-CM

## 2023-06-17 DIAGNOSIS — D509 Iron deficiency anemia, unspecified: Secondary | ICD-10-CM | POA: Diagnosis not present

## 2023-06-17 DIAGNOSIS — D500 Iron deficiency anemia secondary to blood loss (chronic): Secondary | ICD-10-CM

## 2023-06-17 LAB — POTASSIUM: Potassium: 4.2 mmol/L (ref 3.5–5.1)

## 2023-06-17 LAB — MAGNESIUM: Magnesium: 1.8 mg/dL (ref 1.7–2.4)

## 2023-06-17 MED ORDER — IRON SUCROSE 20 MG/ML IV SOLN
200.0000 mg | Freq: Once | INTRAVENOUS | Status: DC
  Filled 2023-06-17: qty 10

## 2023-06-17 NOTE — Progress Notes (Signed)
 Multiple iv attempts made to give venofer. Unsuccessful. Pt would like to reschedule appt. MD team aware

## 2023-06-17 NOTE — Telephone Encounter (Addendum)
 PA for Dupixent pending OV note from 06/15/2023 to be signed  ----- Message from Murrell Redden sent at 06/16/2023  4:22 PM EDT ----- Regarding: Dupixent new start Enrollment forms in onbase

## 2023-06-18 ENCOUNTER — Other Ambulatory Visit: Payer: Self-pay

## 2023-06-18 ENCOUNTER — Emergency Department

## 2023-06-18 ENCOUNTER — Ambulatory Visit: Admitting: Nurse Practitioner

## 2023-06-18 ENCOUNTER — Encounter: Payer: Self-pay | Admitting: Nurse Practitioner

## 2023-06-18 ENCOUNTER — Ambulatory Visit: Payer: Self-pay

## 2023-06-18 ENCOUNTER — Emergency Department
Admission: EM | Admit: 2023-06-18 | Discharge: 2023-06-18 | Disposition: A | Discharge status: Home / Self Care | Attending: Emergency Medicine | Admitting: Emergency Medicine

## 2023-06-18 VITALS — BP 110/60 | HR 107 | Temp 97.1°F | Ht 63.0 in | Wt 118.0 lb

## 2023-06-18 DIAGNOSIS — J441 Chronic obstructive pulmonary disease with (acute) exacerbation: Secondary | ICD-10-CM | POA: Insufficient documentation

## 2023-06-18 DIAGNOSIS — R0609 Other forms of dyspnea: Secondary | ICD-10-CM

## 2023-06-18 DIAGNOSIS — R0602 Shortness of breath: Secondary | ICD-10-CM

## 2023-06-18 LAB — CBC WITH DIFFERENTIAL/PLATELET
Abs Immature Granulocytes: 0.03 10*3/uL (ref 0.00–0.07)
Basophils Absolute: 0.1 10*3/uL (ref 0.0–0.1)
Basophils Relative: 1 %
Eosinophils Absolute: 0.4 10*3/uL (ref 0.0–0.5)
Eosinophils Relative: 5 %
HCT: 33.2 % — ABNORMAL LOW (ref 36.0–46.0)
Hemoglobin: 10.1 g/dL — ABNORMAL LOW (ref 12.0–15.0)
Immature Granulocytes: 0 %
Lymphocytes Relative: 19 %
Lymphs Abs: 1.7 10*3/uL (ref 0.7–4.0)
MCH: 26.6 pg (ref 26.0–34.0)
MCHC: 30.4 g/dL (ref 30.0–36.0)
MCV: 87.6 fL (ref 80.0–100.0)
Monocytes Absolute: 0.6 10*3/uL (ref 0.1–1.0)
Monocytes Relative: 7 %
Neutro Abs: 5.9 10*3/uL (ref 1.7–7.7)
Neutrophils Relative %: 68 %
Platelets: 307 10*3/uL (ref 150–400)
RBC: 3.79 MIL/uL — ABNORMAL LOW (ref 3.87–5.11)
RDW: 21.4 % — ABNORMAL HIGH (ref 11.5–15.5)
Smear Review: NORMAL
WBC: 8.6 10*3/uL (ref 4.0–10.5)
nRBC: 0 % (ref 0.0–0.2)

## 2023-06-18 LAB — COMPREHENSIVE METABOLIC PANEL WITH GFR
ALT: 13 U/L (ref 0–44)
AST: 20 U/L (ref 15–41)
Albumin: 3.3 g/dL — ABNORMAL LOW (ref 3.5–5.0)
Alkaline Phosphatase: 78 U/L (ref 38–126)
Anion gap: 10 (ref 5–15)
BUN: 15 mg/dL (ref 8–23)
CO2: 28 mmol/L (ref 22–32)
Calcium: 9.5 mg/dL (ref 8.9–10.3)
Chloride: 100 mmol/L (ref 98–111)
Creatinine, Ser: 0.5 mg/dL (ref 0.44–1.00)
GFR, Estimated: >60 mL/min (ref >60)
Glucose, Bld: 116 mg/dL — ABNORMAL HIGH (ref 70–99)
Potassium: 4.6 mmol/L (ref 3.5–5.1)
Sodium: 138 mmol/L (ref 135–145)
Total Bilirubin: 0.4 mg/dL (ref 0.0–1.2)
Total Protein: 6.9 g/dL (ref 6.5–8.1)

## 2023-06-18 LAB — TROPONIN I (HIGH SENSITIVITY): Troponin I (High Sensitivity): 6 ng/L (ref <18)

## 2023-06-18 MED ORDER — METHYLPREDNISOLONE SODIUM SUCC 125 MG IJ SOLR
125.0000 mg | Freq: Once | INTRAMUSCULAR | Status: AC
  Administered 2023-06-18: 125 mg via INTRAVENOUS
  Filled 2023-06-18: qty 2

## 2023-06-18 MED ORDER — PREDNISONE 10 MG (21) PO TBPK: ORAL_TABLET | ORAL | 0 refills | Status: DC

## 2023-06-18 MED ORDER — ALBUTEROL SULFATE HFA 108 (90 BASE) MCG/ACT IN AERS: 2.0000 | INHALATION_SPRAY | RESPIRATORY_TRACT | Status: DC | PRN

## 2023-06-18 MED ORDER — IPRATROPIUM-ALBUTEROL 0.5-2.5 (3) MG/3ML IN SOLN
3.0000 mL | Freq: Once | RESPIRATORY_TRACT | Status: AC
  Administered 2023-06-18: 3 mL via RESPIRATORY_TRACT
  Filled 2023-06-18: qty 3

## 2023-06-18 NOTE — ED Provider Notes (Signed)
 [facility] Provider Note    Event Date/Time   First MD Initiated Contact with Patient 06/18/23 1116     (approximate)   History   Shortness of Breath   HPI  Summer Murphy is a 68 y.o. female who presents to the emergency permit today because of concerns of breath.  Patient states she has been dealing with some baseline shortness of breath recently.  However today it was worse.  She knows that when she woke up.  She denies any unusual activity yesterday.  Patient states that she has had a cough.  She also has sensation of some chest tightness.  Denies any recent fevers.  Denies any leg swelling or pain.     Physical Exam   Triage Vital Signs: ED Triage Vitals  Encounter Vitals Group     BP 06/18/23 1106 (!) 124/47     Systolic BP Percentile --      Diastolic BP Percentile --      Pulse Rate 06/18/23 1106 (!) 115     Resp 06/18/23 1106 18     Temp 06/18/23 1106 98.4 F (36.9 C)     Temp Source 06/18/23 1106 Oral     SpO2 06/18/23 1106 95 %     Weight 06/18/23 1107 117 lb 15.1 oz (53.5 kg)     Height --      Head Circumference --      Peak Flow --      Pain Score 06/18/23 1106 7     Pain Loc --      Pain Education --      Exclude from Growth Chart --     Most recent vital signs: Vitals:   06/18/23 1106  BP: (!) 124/47  Pulse: (!) 115  Resp: 18  Temp: 98.4 F (36.9 C)  SpO2: 95%   General: Awake, alert, oriented. CV:  Good peripheral perfusion. Tachycardia. Resp:  Slightly increased work of breathing. Diffuse expiratory wheezing. Abd:  No distention. Non tender.   ED Results / Procedures / Treatments   Labs (all labs ordered are listed, but only abnormal results are displayed) Labs Reviewed  CBC WITH DIFFERENTIAL/PLATELET  COMPREHENSIVE METABOLIC PANEL WITH GFR     EKG  I, Phineas Semen, attending physician, personally viewed and interpreted this EKG  EKG Time: 1134 Rate: 99 Rhythm: sinus rhythm Axis:  normal Intervals: qtc 456 QRS: LBBB ST changes: no st elevation Impression: abnormal ekg   RADIOLOGY I independently interpreted and visualized the CXR. My interpretation: No pneumonia Radiology interpretation:  IMPRESSION:  No active cardiopulmonary disease.      PROCEDURES:  Critical Care performed: No    MEDICATIONS ORDERED IN ED: Medications  albuterol (VENTOLIN HFA) 108 (90 Base) MCG/ACT inhaler 2 puff (has no administration in time range)     IMPRESSION / MDM / ASSESSMENT AND PLAN / ED COURSE  I reviewed the triage vital signs and the nursing notes.                              Differential diagnosis includes, but is not limited to, COPD, PE, ACS, pneumonia, viral uri  Patient's presentation is most consistent with acute presentation with potential threat to life or bodily function.   The patient is on the cardiac monitor to evaluate for evidence of arrhythmia and/or significant heart rate changes.  Patient presented to the emergency department today because of concerns for shortness  of breath.  On exam patient has slightly increased work of breathing and some expiratory wheezing.  Chest x-ray without pneumonia.  Blood work and troponin without concerning abnormalities.  Patient did feel better after breathing treatments and steroids here.  At this time I think likely COPD exacerbation.  Patient feels comfortable with discharge home which I think is reasonable.  Will give patient prescription for further steroids.      FINAL CLINICAL IMPRESSION(S) / ED DIAGNOSES   Final diagnoses:  COPD exacerbation (HCC)     Note:  This document was prepared using Dragon voice recognition software and may include unintentional dictation errors.    Phineas Semen, MD 06/19/23 [phone]

## 2023-06-18 NOTE — ED Triage Notes (Signed)
 SOB x several days.  Referred to ED from PUlmonary.  Hx asthma.  AAOx3.  Skin warm and dry. Slight DOE

## 2023-06-18 NOTE — Telephone Encounter (Signed)
 06/18/2023  I called patient to check on the status of her TENS unit. She has not heard from [facility]. I offered to call the patient support on her behalf for her to check on the status of this.   I called [facility]. They have not received the fax I sent on 06/15/23. I re-faxed this paperwork to [phone].    I will call Summer Murphy to inform her to expect a call from them on Monday or Tuesday next week.

## 2023-06-18 NOTE — Telephone Encounter (Signed)
 Copied from CRM [id number]. Topic: Clinical - Red Word Triage >> Jun 18, 2023  9:22 AM Summer Murphy wrote: [facility] that prompted transfer to Nurse Triage: Patient states she has been coughing Murphy lot with yellow mucus, states she's also experiencing SOB.  TRIAGE SUMMARY NOTE: Pt reporting she has been more SOB than normal "for pretty good while," feeling SOB at rest and with exertion, believes "moderate" change from baseline, but pt reporting started coughing up yellow sputum on 3/26 which has been worsening since then. Pt confirms no chest pain or dizziness. Pt reporting she's needing to use her nebulizer treatments more often and her rescue inhaler "at least 3x Murphy day." Advised pt be examined in next 4 hours, scheduled appt with [place] office this morning, confirmed location/appt info, advised call back if worsening. Pt verbalized understanding.  E2C2 Pulmonary Triage - Initial Assessment Questions "Chief Complaint (e.g., cough, sob, wheezing, fever, chills, sweat or additional symptoms) *Go to specific symptom protocol after initial questions. Coughing yellow up mucus SOB Murphy little more than normal both at rest and with exertion X-ray no pneumonia Doc said asthma getting worse, supposed to get shots No chest pain, dizziness  "How long have symptoms been present?" Been going on for pretty good while, noticed yellow on 3/26  MEDICINES:   "Have you used any OTC meds to help with symptoms?" No  "Have you used your inhalers/maintenance medication?" Yes If yes, "What medications?" Nebulizer treatment, maintenance med, combivent  If inhaler, ask "How many puffs and how often?" Note: Review instructions on medication in the chart. At least 3x Murphy day  OXYGEN: "Do you wear supplemental oxygen?" No  "Do you monitor your oxygen levels?" Yes If yes, "What is your reading (oxygen level) today?" 100% right after nebulizer, 96%  "What is your usual oxygen saturation reading?"  (Note: Pulmonary O2  sats should be 90% or greater) Usually it's up  Reason for Disposition  [1] Longstanding difficulty breathing (e.g., CHF, COPD, emphysema) AND [2] WORSE than normal  Protocols used: Breathing Difficulty-Murphy-AH

## 2023-06-18 NOTE — ED Notes (Signed)
 Pt verbalizes understanding of discharge instructions. Opportunity for questioning and answers were provided. Pt discharged from ED to home with friend.

## 2023-06-18 NOTE — Progress Notes (Signed)
 @Patient  ID: Summer Murphy, female    DOB: 1955/09/14, 68 y.o.   MRN: [id number]  Chief Complaint  Patient presents with   Follow-up    Saw Dr. Aundria Rud 2 days ago. Cough with yellow sputum. Some wheezing. SOB.    Referring provider: [name], NP  HPI: 68 year old female, never smoker followed for severe asthma.  She is a patient of Dr. Doreene Adas and last seen in office 06/16/2023. Past medical history significant for HTN, migraines, Barrett esophagus, GERD, DM, polyneuropathy, cervical disc disorder, anxiety, depression, HFrEF.  She had a cervical spinal fusion 03/02/2023 complicated by MSSA bacteremia. She is currently on cefadroxil and followed by ID.   TEST/EVENTS:  04/18/2023 echo: EF 45-50%, GIDD, LVH, RV size and function nl. Moderately elevated PASP. LA severely dilated. Mild to moderate MR. Mild to moderate TR 06/15/2023 CXR: no acute consolidation or infiltrate   06/16/2023: OV with Dr. Aundria Rud. Worsening SOB, wheeze, cough. Had previously stepped down her therapy from Wixela 500-50 to 250-50. Peripheral eosinophilia up to 800; last IgE from 2024 not significantly elevated (174). Hesitant to increase ICS given overall weight and side effects. Given she remains difficult to control, will initiate biologics with Dupixent.   06/18/2023: Today - acute Discussed the use of AI scribe software for clinical note transcription with the patient, who gave verbal consent to proceed.  History of Present Illness   Summer Murphy is a 68 year old female with asthma who presents with worsening shortness of breath.  She has been experiencing worsening shortness of breath over the past two days, describing her breathing as 'real shallow' and unable to get a deep breath in. She's been having trouble with her breathing and cough for the past few months. She was seen two days ago here and the decision was made to start her on Dupixent for possible worsening of her asthma. She did have a CXR  3 days ago without notable consolidation/infiltrate. She feels like over the past two days, her breathing has gotten worse. She can't do anything and is even short winded at rest.   She describes a productive cough with thick yellow sputum. No fever, chills, hemoptysis, or leg swelling. She's noticed some wheezing, but no chest pain, lightheadedness, or dizziness. Has some occasional palpitations.   She has been on cefadroxil since development of MSSA bacteremia following neck surgery on December 10th, three months ago. Initially on IV abx with PICC line up until March 16th.  She is also on a blood thinner, Eliquis. She does have anemia but hemoglobin earlier this month had improved to 9.9 on 3/18.   She had worsening EF on echo from January 2025, down to 45-50%. She also had moderate PH, severely dilated LA, and worsening valvular disease.       Allergies  Allergen Reactions   Librium [Chlordiazepoxide] Shortness Of Breath   Reglan [Metoclopramide] Hives and Other (See Comments)    hallucinations    Vanilla Shortness Of Breath    Pt reports allergy to vanilla extract only   Aspirin Hives   Nsaids Rash    Rash/flares asthma issues.   Azithromycin Other (See Comments)    Unsure of what the reaction was.   Buprenorphine Hcl Other (See Comments)    Unsure of what the reaction was.    Flexeril [Cyclobenzaprine]     hallucinations   Morphine Other (See Comments)    Unsure of reaction   Sulfa Antibiotics     Other reaction(s): Unknown  Tolmetin     Other Reaction: Allergy   Amlodipine Besylate Itching and Rash    arms, stomach and forehead   Iron Nausea And Vomiting    Immunization History  Administered Date(s) Administered   Influenza Inj Mdck Quad Pf 11/29/2019, 12/03/2021   Influenza, High Dose Seasonal PF 12/13/2019   Influenza, Mdck, Trivalent,PF 6+ MOS(egg free) 12/30/2022   Influenza,inj,Quad PF,6+ Mos 01/21/2016, 03/12/2019   Influenza-Unspecified 01/21/2016,  02/25/2017, 03/12/2019   Moderna Covid Bivalent Peds Booster(56mo Thru 19yrs) 02/13/2020, 07/09/2020, 01/22/2021   Moderna SARS-COV2 Booster Vaccination 02/13/2020, 07/09/2020, 01/22/2021   Moderna Sars-Covid-2 Vaccination 05/11/2019, 06/08/2019   Pneumococcal Polysaccharide-23 01/21/2016    Past Medical History:  Diagnosis Date   Acid reflux    Anemia    Anxiety    a.) buspirone BID + BZO PRN (clonazepam)   Arthritis    Asthma    Cervical disc disorder with radiculopathy of cervical region    Chronic pain syndrome    a.) on COT as prescribed by pain mangement   Chronic, continuous use of opioids    a.) followed by pain mangement   COPD (chronic obstructive pulmonary disease) (HCC)    DOE (dyspnea on exertion)    Hematuria    Hiatal hernia    History of kidney stones    Hypertension    Hypoglycemia    Insomnia    a.) uses Trazodone PRN   LBBB (left bundle branch block)    Migraine    Murmur    Osteopenia    Palpitations    Pre-diabetes    Restless leg    a.) on ropinirole   Stenosis of cervical spine with myelopathy (HCC)    T2DM (type 2 diabetes mellitus) (HCC)     Tobacco History: Social History   Tobacco Use  Smoking Status Never   Passive exposure: Past  Smokeless Tobacco Never   Counseling given: Not Answered   Outpatient Medications Prior to Visit  Medication Sig Dispense Refill   acetaminophen (TYLENOL) 500 MG tablet Take 2 tablets (1,000 mg total) by mouth every 6 (six) hours as needed. 100 tablet 2   alendronate (FOSAMAX) 70 MG tablet Take 1 tablet (70 mg total) by mouth once a week. Take with a full glass of water on an empty stomach. 12 tablet 3   apixaban (ELIQUIS) 5 MG TABS tablet Take 1 tablet (5 mg total) by mouth 2 (two) times daily. 60 tablet 1   busPIRone (BUSPAR) 10 MG tablet Take 1 tablet (10 mg total) by mouth 2 (two) times daily. 60 tablet 2   cefadroxil (DURICEF) 500 MG capsule Take 2 capsules (1,000 mg total) by mouth 2 (two) times  daily. 120 capsule 1   clonazePAM (KLONOPIN) 1 MG tablet Take 0.5-1 tablets (0.5-1 mg total) by mouth 2 (two) times daily as needed for anxiety. 60 tablet 0   COMBIVENT RESPIMAT 20-100 MCG/ACT AERS respimat 1 puff every 6 (six) hours.     diclofenac Sodium (VOLTAREN) 1 % GEL Apply 4 g topically 4 (four) times daily. (Patient taking differently: Apply 4 g topically 4 (four) times daily as needed (pain).) 350 g 1   DULoxetine (CYMBALTA) 60 MG capsule Take 1 capsule (60 mg total) by mouth at bedtime. 90 capsule 1   Fe Fum-Vit C-Vit B12-FA (TRIGELS-F FORTE) CAPS capsule Take 1 capsule by mouth 2 (two) times daily. 180 capsule 3   fluticasone-salmeterol (WIXELA INHUB) 250-50 MCG/ACT AEPB Inhale 1 puff into the lungs in the morning and at  bedtime. 60 each 12   gabapentin (NEURONTIN) 300 MG capsule Take 1 capsule (300 mg total) by mouth 2 (two) times daily. 60 capsule 2   HYDROcodone-acetaminophen (NORCO/VICODIN) 5-325 MG tablet Take 1 tablet by mouth 2 (two) times daily as needed for severe pain (pain score 7-10). 45 tablet 0   ipratropium-albuterol (DUONEB) 0.5-2.5 (3) MG/3ML SOLN Take 3 mLs by nebulization every 6 (six) hours as needed.     methocarbamol (ROBAXIN) 750 MG tablet Take 1 tablet (750 mg total) by mouth every 8 (eight) hours as needed for muscle spasms. 90 tablet 5   montelukast (SINGULAIR) 10 MG tablet Take 1 tablet (10 mg total) by mouth at bedtime. 90 tablet 1   ondansetron (ZOFRAN) 4 MG tablet TAKE 1 TABLET BY MOUTH TWICE DAILY AS NEEDED 45 tablet 1   oxybutynin (DITROPAN-XL) 10 MG 24 hr tablet Take 1 tablet (10 mg total) by mouth at bedtime. 30 tablet 5   pantoprazole (PROTONIX) 40 MG tablet Take 1 tablet (40 mg total) by mouth 2 (two) times daily. 180 tablet 1   polyethylene glycol (MIRALAX / GLYCOLAX) 17 g packet Take 17 g by mouth 2 (two) times daily.     rOPINIRole (REQUIP) 1 MG tablet Take 1 tablet (1 mg total) by mouth at bedtime. 90 tablet 1   senna (SENOKOT) 8.6 MG TABS tablet  Take 1 tablet (8.6 mg total) by mouth 2 (two) times daily as needed for mild constipation. 30 tablet 0   SUMAtriptan (IMITREX) 100 MG tablet Take 1 tablet (100 mg total) by mouth every 2 (two) hours as needed (for migraine headaches.). May repeat in 1 hours if headache persists or recurs. 10 tablet 3   traZODone (DESYREL) 150 MG tablet TAKE 1 TABLET(150 MG) BY MOUTH AT BEDTIME 30 tablet 2   potassium chloride SA (KLOR-CON M) 20 MEQ tablet Take 1 tablet (20 mEq total) by mouth 2 (two) times daily for 7 days. 14 tablet 0   No facility-administered medications prior to visit.     Review of Systems:   Constitutional: No night sweats, fevers, chills. +weight loss, fatigue, lassitude. HEENT: No headaches, difficulty swallowing, tooth/dental problems, or sore throat. No sneezing, itching, ear ache, nasal congestion, or post nasal drip CV:  +palpitations. No chest pain, orthopnea, PND, swelling in lower extremities, anasarca, dizziness, syncope Resp: +shortness of breath with exertion and at rest; productive cough; rare wheeze. No hemoptysis.  GI:  No heartburn, indigestion, bloody stools. +loss of appetite Neuro: No memory impairment  Psych: Mood stable.     Physical Exam:  BP 110/60 (BP Location: Right Arm, Cuff Size: Normal)   Pulse (!) 107   Temp (!) 97.1 F (36.2 C)   Ht 5\' 3"  (1.6 m)   Wt 118 lb (53.5 kg) Comment: per patient. in a wheelchair today.  SpO2 97%   BMI 20.90 kg/m   GEN: Pleasant, interactive, acute on chronically-ill appearing; in mild respiratory distress HEENT:  Normocephalic and atraumatic. PERRLA. Sclera white. Nasal turbinates pink, moist and patent bilaterally. No rhinorrhea present. Oropharynx pink and moist, without exudate or edema. No lesions, ulcerations, or postnasal drip.  NECK:  Supple w/ fair ROM. No lymphadenopathy.   CV: Tachycardic with frequent ectope, no m/r/g, no peripheral edema. Pulses intact, +2 bilaterally. No cyanosis, pallor or  clubbing. PULMONARY:  Labored breathing. Diminished bibasilar airflow otherwise clear bilaterally A&P w/o wheezes/rales/rhonchi. Congested cough. Slight tripoding  GI: BS present and normoactive. Soft, non-tender to palpation. No organomegaly or masses detected.  MSK: No erythema, warmth or tenderness.  Neuro: A/Ox3. No focal deficits noted.   Skin: Warm, no lesions or rashe Psych: Normal affect and behavior. Judgement and thought content appropriate.     Lab Results:  CBC    Component Value Date/Time   WBC 10.4 06/08/2023 1228   RBC 3.83 (L) 06/08/2023 1228   HGB 9.9 (L) 06/08/2023 1228   HGB 7.8 (L) 05/25/2023 1529   HGB 13.4 08/29/2021 1303   HCT 32.4 (L) 06/08/2023 1228   HCT 39.4 08/29/2021 1303   PLT 372 06/08/2023 1228   PLT 378 05/25/2023 1529   PLT 186 08/29/2021 1303   MCV 84.6 06/08/2023 1228   MCV 91 08/29/2021 1303   MCV 84 06/28/2014 1710   MCH 25.8 (L) 06/08/2023 1228   MCHC 30.6 06/08/2023 1228   RDW 23.7 (H) 06/08/2023 1228   RDW 12.6 08/29/2021 1303   RDW 16.7 (H) 06/28/2014 1710   LYMPHSABS 1.3 06/08/2023 1228   LYMPHSABS 2.0 08/29/2021 1303   LYMPHSABS 0.6 (L) 06/13/2013 0505   MONOABS 0.6 06/08/2023 1228   MONOABS 0.1 (L) 06/13/2013 0505   EOSABS 0.2 06/08/2023 1228   EOSABS 0.2 08/29/2021 1303   EOSABS 0.0 06/13/2013 0505   BASOSABS 0.1 06/08/2023 1228   BASOSABS 0.0 08/29/2021 1303   BASOSABS 0.0 06/13/2013 0505    BMET    Component Value Date/Time   NA 137 06/08/2023 1228   NA 142 01/15/2022 1042   NA 136 06/28/2014 1710   K 4.2 06/17/2023 0939   K 3.5 06/28/2014 1710   CL 100 06/08/2023 1228   CL 102 06/28/2014 1710   CO2 27 06/08/2023 1228   CO2 25 06/28/2014 1710   GLUCOSE 89 06/08/2023 1228   GLUCOSE 82 06/28/2014 1710   BUN 15 06/08/2023 1228   BUN 11 01/15/2022 1042   BUN 13 06/28/2014 1710   CREATININE 0.38 (L) 06/08/2023 1228   CREATININE 0.50 06/28/2014 1710   CALCIUM 8.7 (L) 06/08/2023 1228   CALCIUM 8.4 (L)  06/28/2014 1710   GFRNONAA >60 06/08/2023 1228   GFRNONAA >60 06/28/2014 1710   GFRAA >60 11/16/2019 0424   GFRAA >60 06/28/2014 1710    BNP    Component Value Date/Time   BNP 32.4 08/07/2022 1456     Imaging:  DG Chest 2 View Result Date: 06/15/2023 CLINICAL DATA:  Shortness of breath and cough. EXAM: CHEST - 2 VIEW COMPARISON:  Chest radiograph dated 04/16/2023. FINDINGS: No focal consolidation, pleural effusion or pneumothorax. The cardiac silhouette is within limits. Small hiatal hernia. Multiple surgical clips in the upper abdomen. Anterior chest wall soft tissue calcification. No acute osseous pathology. IMPRESSION: 1. No active cardiopulmonary disease. 2. Small hiatal hernia. Electronically Signed   By: Elgie Collard M.D.   On: 06/15/2023 14:22   DG Cervical Spine 2 or 3 views Result Date: 06/09/2023 CLINICAL DATA:  Status post fusion cervical spine on 03/02/2023 EXAM: CERVICAL SPINE - 2-3 VIEW COMPARISON:  April 02, 2023 FINDINGS: Comparison with prior examination accounting for differences in technique and position there is no significant change. The status post posterior fusion of C3-C7. Hardware appears intact no evidence of hardware failure There is narrowing of the C3-C4 C4-C5 C5-C6 disc spaces. With mild anterior osteophytic changes. Prevertebral soft tissues normal C1-C2 articulation and predental space normal IMPRESSION: *Status post posterior fusion of C3-C7. Hardware appears intact no evidence of hardware failure. *Degenerative disc disease. Electronically Signed   By: Shaaron Adler M.D.   On:  06/09/2023 18:44    diphenhydrAMINE (BENADRYL) injection 25 mg     Date Action Dose Route User   05/28/2023 1046 Given 25 mg Intravenous Delena Bali, RN      heparin lock flush 100 unit/mL     Date Action Dose Route User   05/25/2023 1605 Given 250 Units Intravenous Delena Bali, RN      heparin lock flush 100 unit/mL     Date Action Dose Route User   05/28/2023 1205  Given 250 Units Intracatheter Talmage, Amy H, RN      iron sucrose (VENOFER) injection 200 mg     Date Action Dose Route User   05/28/2023 1116 Given 200 mg Intravenous Delena Bali, RN      iron sucrose (VENOFER) injection 200 mg     Date Action Dose Route User   06/03/2023 1046 Given 200 mg Intravenous Chilcott, Lissa Merlin, RN      iron sucrose (VENOFER) injection 200 mg     Date Action Dose Route User   06/10/2023 1014 Given 200 mg Intravenous Robbie Lis, RN      magnesium sulfate IVPB 4 g 100 mL     Date Action Dose Route User   06/10/2023 1133 New Bag/Given 4 g Intravenous Robbie Lis, RN      potassium chloride 20 mEq in 100 mL IVPB     Date Action Dose Route User   06/03/2023 1128 Rate/Dose Change (none) Intravenous Storm Frisk, RN   06/03/2023 1128 New Bag/Given 20 mEq Intravenous Chilcott, Lissa Merlin, RN      potassium chloride 20 mEq in 100 mL IVPB     Date Action Dose Route User   06/10/2023 1259 Rate/Dose Change (none) Intravenous Robbie Lis, RN   06/10/2023 1258 Rate/Dose Change (none) Intravenous Robbie Lis, RN   06/10/2023 1133 Rate/Dose Change (none) Intravenous Robbie Lis, RN   06/10/2023 1059 Restarted (none) Intravenous Robbie Lis, RN   06/10/2023 1056 Rate/Dose Change (none) Intravenous Robbie Lis, RN      potassium chloride SA (KLOR-CON M) CR tablet 20 mEq     Date Action Dose Route User   06/10/2023 1050 Given 20 mEq Oral Sung Amabile D, RN      0.9 %  sodium chloride infusion     Date Action Dose Route User   05/28/2023 1121 Rate/Dose Change (none) Intravenous Delena Bali, RN   05/28/2023 1121 Rate/Dose Change (none) Intravenous Delena Bali, RN   05/28/2023 1116 Rate/Dose Change (none) Intravenous Delena Bali, RN   05/28/2023 1054 Restarted (none) Intravenous Delena Bali, RN   05/28/2023 1045 New Bag/Given (none) Intravenous Jeannetta Nap, Demetrios Isaacs, RN      0.9 %  sodium chloride infusion     Date  Action Dose Route User   06/03/2023 1328 Restarted (none) Intravenous Storm Frisk, RN   06/03/2023 1127 New Bag/Given (none) Intravenous Chilcott, Loren O, RN      0.9 %  sodium chloride infusion     Date Action Dose Route User   06/10/2023 1152 Rate/Dose Change (none) Intravenous Robbie Lis, RN   06/10/2023 1151 Rate/Dose Change (none) Intravenous Robbie Lis, RN   06/10/2023 1144 Rate/Dose Change (none) Intravenous Robbie Lis, RN   06/10/2023 1143 Rate/Dose Change (none) Intravenous Robbie Lis, RN   06/10/2023 1119 Rate/Dose Change (none) Intravenous Robbie Lis, RN      sodium chloride flush (NS) 0.9 %  injection 10 mL     Date Action Dose Route User   05/25/2023 1605 Given 10 mL Intravenous Delena Bali, RN      sodium chloride flush (NS) 0.9 % injection 10 mL     Date Action Dose Route User   06/10/2023 1014 Given 10 mL Intracatheter Robbie Lis, RN           No data to display          Lab Results  Component Value Date   NITRICOXIDE 6 09/16/2022        Assessment & Plan:   DOE (dyspnea on exertion) Worsening DOE. She does not have significant bronchospasm on exam. Does not seem consistent with asthma exacerbation. Differentials are broad. Tachycardic on exam with ectopy. EKG with BBB, hypertrophy. Doesn't appear to be significantly changed from 03/2023 or 04/2023. Given her recent history, concern for worsening infectious process, HF exacerbation or alternative cardiac etiology, or worsening anemia. She is unsafe for continued outpatient management at this point. ED evaluation with possible hospital admission recommended. Discussed with Dr. Jayme Cloud who agreed. Pt was safely transported to the ED by CMA and hand off provided to ED triage. VS were stable on room air.    Advised if symptoms do not improve or worsen, to please contact office for sooner follow up or seek emergency care.   I spent 45 minutes of dedicated to the  care of this patient on the date of this encounter to include pre-visit review of records, face-to-face time with the patient discussing conditions above, post visit ordering of testing, clinical documentation with the electronic health record, making appropriate referrals as documented, and communicating necessary findings to members of the patients care team.  Noemi Chapel, NP 06/18/2023  Pt aware and understands NP's role.

## 2023-06-18 NOTE — Assessment & Plan Note (Signed)
 Worsening DOE. She does not have significant bronchospasm on exam. Does not seem consistent with asthma exacerbation. Differentials are broad. Tachycardic on exam with ectopy. EKG with BBB, hypertrophy. Doesn't appear to be significantly changed from 03/2023 or 04/2023. Given her recent history, concern for worsening infectious process, HF exacerbation or alternative cardiac etiology, or worsening anemia. She is unsafe for continued outpatient management at this point. ED evaluation with possible hospital admission recommended. Discussed with Dr. Jayme Cloud who agreed. Pt was safely transported to the ED by CMA and hand off provided to ED triage. VS were stable on room air.

## 2023-06-18 NOTE — ED Notes (Signed)
 Pt states she has been having shortness of breath for the past few days. States that same is worse today and worse upon excretion.  The pt states she used her albuterol treatment at home with no alleviation.  Pt denies any pain at this time.

## 2023-06-18 NOTE — Telephone Encounter (Signed)
 Noted. Nothing further needed.

## 2023-06-21 NOTE — Telephone Encounter (Signed)
 Submitted a Prior Authorization request to [facility] for DUPIXENT via CoverMyMeds. Will update once we receive a response.  Key: Daria Pastures, PharmD, MPH, BCPS, CPP Clinical Pharmacist (Rheumatology and Pulmonology)

## 2023-06-22 ENCOUNTER — Telehealth: Payer: Self-pay

## 2023-06-22 NOTE — Telephone Encounter (Signed)
 Pt called that she went to ED for shortness of breath they gave her prednisone making her dizziness advised her call her pulmonology and drink plenty of water and used walker

## 2023-06-24 ENCOUNTER — Telehealth: Payer: Self-pay

## 2023-06-24 ENCOUNTER — Inpatient Hospital Stay: Attending: Oncology

## 2023-06-24 ENCOUNTER — Inpatient Hospital Stay: Payer: 59 | Admitting: Infectious Diseases

## 2023-06-24 VITALS — BP 120/60 | HR 101 | Resp 16

## 2023-06-24 DIAGNOSIS — R7881 Bacteremia: Secondary | ICD-10-CM | POA: Diagnosis not present

## 2023-06-24 DIAGNOSIS — G2581 Restless legs syndrome: Secondary | ICD-10-CM | POA: Diagnosis not present

## 2023-06-24 DIAGNOSIS — D500 Iron deficiency anemia secondary to blood loss (chronic): Secondary | ICD-10-CM

## 2023-06-24 DIAGNOSIS — Z803 Family history of malignant neoplasm of breast: Secondary | ICD-10-CM | POA: Insufficient documentation

## 2023-06-24 DIAGNOSIS — Z8719 Personal history of other diseases of the digestive system: Secondary | ICD-10-CM | POA: Insufficient documentation

## 2023-06-24 DIAGNOSIS — D509 Iron deficiency anemia, unspecified: Secondary | ICD-10-CM | POA: Insufficient documentation

## 2023-06-24 MED ORDER — IRON SUCROSE 20 MG/ML IV SOLN
200.0000 mg | Freq: Once | INTRAVENOUS | Status: AC
Start: 2023-06-24 — End: 2023-06-24
  Administered 2023-06-24: 200 mg via INTRAVENOUS
  Filled 2023-06-24: qty 10

## 2023-06-24 NOTE — Telephone Encounter (Signed)
 I called Ms. Jeyla to see if she had heard from [facility]. She said that she had not.  I called [employer] and they have received the order and attempted to contact the patient. I told them that they best way to contact her is via cell phone. We confirmed her mobile number. They will follow up with her.   I called Ms. Alvis back. She will expect a call from them and let me know when she has heard from them.

## 2023-06-25 ENCOUNTER — Other Ambulatory Visit (HOSPITAL_COMMUNITY): Payer: Self-pay

## 2023-06-25 NOTE — Telephone Encounter (Signed)
 She is calling to let you know that [employer] did call her today. They told her that it will be about 2-3 weeks.

## 2023-06-25 NOTE — Telephone Encounter (Signed)
 Received notification from [facility] regarding a prior authorization for DUPIXENT. Authorization has been APPROVED from 06/21/2023 to 12/21/2023. Approval letter sent to scan center.  Per test claim, copay for 28 days supply is $0  Patient can fill through [facility]: [phone]   Authorization # [id number]  Patient can be scheduled for Dupixent new start  Chesley Mires, PharmD, MPH, BCPS, CPP Clinical Pharmacist (Rheumatology and Pulmonology)

## 2023-06-28 NOTE — Telephone Encounter (Signed)
 Called patient to schedule Dupixent new start. She states that she is unable to get to [facility] and does not have anyone to bring her to [place] location. Will try to get to [facility] clinic for patient on Friday, 07/02/2023  She has taken [facility] in past and feels comfortable with injectable.  Chesley Mires, PharmD, MPH, BCPS, CPP Clinical Pharmacist (Rheumatology and Pulmonology)

## 2023-06-30 NOTE — Telephone Encounter (Signed)
 Patient scheduled for Dupixent new start at [facility] clinic on 07/02/23 @ 2pm

## 2023-07-02 ENCOUNTER — Other Ambulatory Visit: Payer: Self-pay | Admitting: Pharmacist

## 2023-07-02 ENCOUNTER — Telehealth: Payer: Self-pay | Admitting: Neurosurgery

## 2023-07-02 ENCOUNTER — Ambulatory Visit (INDEPENDENT_AMBULATORY_CARE_PROVIDER_SITE_OTHER): Admitting: Pharmacist

## 2023-07-02 DIAGNOSIS — Z7722 Contact with and (suspected) exposure to environmental tobacco smoke (acute) (chronic): Secondary | ICD-10-CM | POA: Diagnosis not present

## 2023-07-02 DIAGNOSIS — J4550 Severe persistent asthma, uncomplicated: Secondary | ICD-10-CM | POA: Diagnosis not present

## 2023-07-02 DIAGNOSIS — Z7189 Other specified counseling: Secondary | ICD-10-CM | POA: Diagnosis not present

## 2023-07-02 MED ORDER — DUPIXENT 300 MG/2ML ~~LOC~~ SOAJ
300.0000 mg | SUBCUTANEOUS | 1 refills | Status: AC
Start: 2023-07-02 — End: ?
  Filled 2023-07-12: qty 4, 28d supply, fill #0
  Filled 2023-08-02: qty 4, 28d supply, fill #1
  Filled 2023-09-13: qty 4, 28d supply, fill #2
  Filled 2023-10-07: qty 4, 28d supply, fill #3
  Filled 2023-11-03 – 2023-11-05 (×3): qty 4, 28d supply, fill #4

## 2023-07-02 NOTE — Telephone Encounter (Signed)
 Great. I notified her.

## 2023-07-02 NOTE — Telephone Encounter (Signed)
  Media Information  Document Information  cervical wash out 04/17/23, C3-6 posterior cervical laminectomy and fusion on 03/02/23/  Can she drive yet?

## 2023-07-02 NOTE — Progress Notes (Signed)
 Patient completed first dose of Dupixent in office today. No reaction  PLAN Continue Dupixent 300mg  every 14 days.  Next dose is due 07/16/23 and every 14 days thereafter. Rx sent to: [facility]: [phone] .  Patient provided with pharmacy phone number and advised that [facility] will call to schedule shipment to home.  Continue maintenance inhaler regimen of: Wixela 250-10mcg (1 puff twice daily)

## 2023-07-02 NOTE — Telephone Encounter (Signed)
 She takes Tylenol 3 at bedtime only. Is it okay for her to drive in the morning after 8 hours?

## 2023-07-02 NOTE — Progress Notes (Addendum)
 HPI Patient presents today to [facility] to see pharmacy team for Dupixent new start for severe asthma. Went to ED for COPD exacerbation in March 2025. PMH also significant for HTN, migraine, Barret esophagus, T2DM.  Respiratory Medications Current regimen: Wixela 250-46mcg (1 puff twice daily) Patient reports no known adherence challenges  OBJECTIVE Allergies  Allergen Reactions   Librium [Chlordiazepoxide] Shortness Of Breath   Reglan [Metoclopramide] Hives and Other (See Comments)    hallucinations    Vanilla Shortness Of Breath    Pt reports allergy to vanilla extract only   Aspirin Hives   Nsaids Rash    Rash/flares asthma issues.   Azithromycin Other (See Comments)    Unsure of what the reaction was.   Buprenorphine Hcl Other (See Comments)    Unsure of what the reaction was.    Flexeril [Cyclobenzaprine]     hallucinations   Morphine Other (See Comments)    Unsure of reaction   Sulfa Antibiotics     Other reaction(s): Unknown   Tolmetin     Other Reaction: Allergy   Amlodipine Besylate Itching and Rash    arms, stomach and forehead   Iron Nausea And Vomiting    Outpatient Encounter Medications as of 07/02/2023  Medication Sig Note   acetaminophen (TYLENOL) 500 MG tablet Take 2 tablets (1,000 mg total) by mouth every 6 (six) hours as needed. 04/22/2023: prn   alendronate (FOSAMAX) 70 MG tablet Take 1 tablet (70 mg total) by mouth once a week. Take with a full glass of water on an empty stomach.    apixaban (ELIQUIS) 5 MG TABS tablet Take 1 tablet (5 mg total) by mouth 2 (two) times daily.    busPIRone (BUSPAR) 10 MG tablet Take 1 tablet (10 mg total) by mouth 2 (two) times daily.    cefadroxil (DURICEF) 500 MG capsule Take 2 capsules (1,000 mg total) by mouth 2 (two) times daily.    clonazePAM (KLONOPIN) 1 MG tablet Take 0.5-1 tablets (0.5-1 mg total) by mouth 2 (two) times daily as needed for anxiety.    COMBIVENT RESPIMAT 20-100 MCG/ACT AERS respimat 1  puff every 6 (six) hours.    diclofenac Sodium (VOLTAREN) 1 % GEL Apply 4 g topically 4 (four) times daily. (Patient taking differently: Apply 4 g topically 4 (four) times daily as needed (pain).) 04/22/2023: prn   DULoxetine (CYMBALTA) 60 MG capsule Take 1 capsule (60 mg total) by mouth at bedtime.    Fe Fum-Vit C-Vit B12-FA (TRIGELS-F FORTE) CAPS capsule Take 1 capsule by mouth 2 (two) times daily.    fluticasone-salmeterol (WIXELA INHUB) 250-50 MCG/ACT AEPB Inhale 1 puff into the lungs in the morning and at bedtime.    gabapentin (NEURONTIN) 300 MG capsule Take 1 capsule (300 mg total) by mouth 2 (two) times daily.    HYDROcodone-acetaminophen (NORCO/VICODIN) 5-325 MG tablet Take 1 tablet by mouth 2 (two) times daily as needed for severe pain (pain score 7-10).    ipratropium-albuterol (DUONEB) 0.5-2.5 (3) MG/3ML SOLN Take 3 mLs by nebulization every 6 (six) hours as needed.    methocarbamol (ROBAXIN) 750 MG tablet Take 1 tablet (750 mg total) by mouth every 8 (eight) hours as needed for muscle spasms.    montelukast (SINGULAIR) 10 MG tablet Take 1 tablet (10 mg total) by mouth at bedtime.    ondansetron (ZOFRAN) 4 MG tablet TAKE 1 TABLET BY MOUTH TWICE DAILY AS NEEDED    oxybutynin (DITROPAN-XL) 10 MG 24 hr tablet Take 1 tablet (  10 mg total) by mouth at bedtime.    pantoprazole (PROTONIX) 40 MG tablet Take 1 tablet (40 mg total) by mouth 2 (two) times daily.    polyethylene glycol (MIRALAX / GLYCOLAX) 17 g packet Take 17 g by mouth 2 (two) times daily.    potassium chloride SA (KLOR-CON M) 20 MEQ tablet Take 1 tablet (20 mEq total) by mouth 2 (two) times daily for 7 days.    rOPINIRole (REQUIP) 1 MG tablet Take 1 tablet (1 mg total) by mouth at bedtime.    senna (SENOKOT) 8.6 MG TABS tablet Take 1 tablet (8.6 mg total) by mouth 2 (two) times daily as needed for mild constipation. 04/22/2023: prn   SUMAtriptan (IMITREX) 100 MG tablet Take 1 tablet (100 mg total) by mouth every 2 (two) hours as  needed (for migraine headaches.). May repeat in 1 hours if headache persists or recurs.    traZODone (DESYREL) 150 MG tablet TAKE 1 TABLET(150 MG) BY MOUTH AT BEDTIME    [DISCONTINUED] predniSONE (STERAPRED UNI-PAK 21 TAB) 10 MG (21) TBPK tablet Per packaging instructions    No facility-administered encounter medications on file as of 07/02/2023.     Immunization History  Administered Date(s) Administered   Influenza Inj Mdck Quad Pf 11/29/2019, 12/03/2021   Influenza, High Dose Seasonal PF 12/13/2019   Influenza, Mdck, Trivalent,PF 6+ MOS(egg free) 12/30/2022   Influenza,inj,Quad PF,6+ Mos 01/21/2016, 03/12/2019   Influenza-Unspecified 01/21/2016, 02/25/2017, 03/12/2019   Moderna Covid Bivalent Peds Booster(8mo Thru 10yrs) 02/13/2020, 07/09/2020, 01/22/2021   Moderna SARS-COV2 Booster Vaccination 02/13/2020, 07/09/2020, 01/22/2021   Moderna Sars-Covid-2 Vaccination 05/11/2019, 06/08/2019   Pneumococcal Polysaccharide-23 01/21/2016     PFTs     No data to display          Eosinophils Most recent blood eosinophil count was 400 cells/microL taken on 06/18/23.   IgE: 174 on 08/07/2022  Assessment   Biologics training for dupilumab (Dupixent)  Goals of therapy: Mechanism: human monoclonal IgG4 antibody that inhibits interleukin-4 and interleukin-13 cytokine-induced responses, including release of proinflammatory cytokines, chemokines, and IgE Reviewed that Dupixent is add-on medication and patient must continue maintenance inhaler regimen. Response to therapy: may take 4 months to determine efficacy. Discussed that patients generally feel improvement sooner than 4 months.  Side effects: injection site reaction (6-18%), antibody development (5-16%), ophthalmic conjunctivitis (2-16%), transient blood eosinophilia (1-2%)  Dose: 600mg  at Week 0 (administered today in clinic) followed by 300mg  every 14 days thereafter  Administration/Storage:  Reviewed administration sites of thigh  or abdomen (at least 2-3 inches away from abdomen). Reviewed the upper arm is only appropriate if caregiver is administering injection  Do not shake pen/syringe as this could lead to product foaming or precipitation. Do not use if solution is discolored or contains particulate matter or if window on prefilled pen is yellow (indicates pen has been used).  Reviewed storage of medication in refrigerator. Reviewed that Dupixent can be stored at room temperature in unopened carton for up to 14 days.  Access: Approval of Dupixent through: insurance  Patient self-administered Dupixent 300mg /18ml x 2 (total dose 600mg ) in right lower abdomen and left lower abdomen using sample Dupixent 300mg /3mL autoinjector pen NDC: [phone] Lot: [id number] Expiration: 01/20/2025  Patient monitored for 30 minutes for adverse reaction.  Patient tolerated well.  Injection site noted. Patient denies itchiness and irritation at injection., No swelling or redness noted., and Reviewed injection site reaction management with patient verbally and printed information for review in AVS  Medication Reconciliation  A  drug regimen assessment was performed, including review of allergies, interactions, disease-state management, dosing and immunization history. Medications were reviewed with the patient, including name, instructions, indication, goals of therapy, potential side effects, importance of adherence, and safe use.  Drug interaction(s): none noted  PLAN Continue Dupixent 300mg  every 14 days.  Next dose is due 07/16/23 and every 14 days thereafter. Rx sent to: [facility]: [phone] .  Patient provided with pharmacy phone number and advised that Mills Koller will call to schedule shipment to home.  Continue maintenance inhaler regimen of: Wixela 250-7mcg (1 puff twice daily)  All questions encouraged and answered.  Instructed patient to reach out with any further questions or concerns.  Thank you for  allowing pharmacy to participate in this patient's care.  This appointment required 45 minutes of patient care (this includes precharting, chart review, review of results, face-to-face care, etc.).   Chesley Mires, PharmD, MPH, BCPS, CPP Clinical Pharmacist (Rheumatology and Pulmonology)

## 2023-07-02 NOTE — Patient Instructions (Signed)
 Your next DUPIXENT dose is due on 07/16/23, 07/30/23, and every 14 days thereafter  CONTINUE Wixela (1 puff twice daily)  Your prescription will be shipped from [facility]. Their phone number is [phone] Mills Koller will call to schedule shipment and confirm address. They will mail your medication to your home.  You will need to be seen by your provider in 3 to 4 months to assess how Dupixent is working for you. Please ensure you have a follow-up appointment scheduled in July or August 2025. Call our clinic if you need to make this appointment.  Stay up to date on all routine vaccines: influenza, pneumonia, COVID19, Shingles  How to manage an injection site reaction: Remember the 5 C's: COUNTER - leave on the counter at least 30 minutes but up to overnight to bring medication to room temperature. This may help prevent stinging COLD - place something cold (like an ice gel pack or cold water bottle) on the injection site just before cleansing with alcohol. This may help reduce pain CLARITIN - use Claritin (generic name is loratadine) for the first two weeks of treatment or the day of, the day before, and the day after injecting. This will help to minimize injection site reactions CORTISONE CREAM - apply if injection site is irritated and itching CALL ME - if injection site reaction is bigger than the size of your fist, looks infected, blisters, or if you develop hives

## 2023-07-03 ENCOUNTER — Encounter: Payer: Self-pay | Admitting: Nurse Practitioner

## 2023-07-05 ENCOUNTER — Ambulatory Visit: Payer: Self-pay | Admitting: Student in an Organized Health Care Education/Training Program

## 2023-07-05 DIAGNOSIS — J4489 Other specified chronic obstructive pulmonary disease: Secondary | ICD-10-CM

## 2023-07-05 NOTE — Telephone Encounter (Signed)
  Chief Complaint: medication question Symptoms: generalized itching Frequency: Friday Pertinent Negatives: Patient denies current itching, SOB, CP Disposition: [] ED /[] Urgent Care (no appt availability in office) / [] Appointment(In office/virtual)/ []  [facility]/ [] Home Care/ [] Refused Recommended Disposition /[] [facility]/ [x]  Follow-up with Pulm Additional Notes: Pt c/o generalized itching s/p Dupixent injection on Friday. Pt endorses taking benadryl that provided relief. Denies any hives, SOB, CP, injection site issues. Triager will forward encounter for Vergia Glasgow, MD  to review and advise. Patient verbalized understanding and is expecting call back from office for next steps. Triager also advised to call back/go to ED for any worsening sx.      Copied from CRM [id number]. Topic: Clinical - Red Word Triage >> Jul 05, 2023  9:52 AM Laurina Popper O wrote: [facility] that prompted transfer to Nurse Triage: PATIENT CALLING CAUSE SHE RECEIVED A SHOT FROM THE PHARMACIST IN [place] AND SHE DIDN'T BREAK OUT IN [place] BUT HAD A LOT OF ITCHING Reason for Disposition  [1] Caller has URGENT medicine question about med that PCP or specialist prescribed AND [2] triager unable to answer question  Answer Assessment - Initial Assessment Questions E2C2 Pulmonary Triage - Initial Assessment Questions "Chief Complaint (e.g., cough, sob, wheezing, fever, chills, sweat or additional symptoms) *Go to specific symptom protocol after initial questions. Possible med reaction  "How long have symptoms been present?" Friday    1. NAME of MEDICINE: "What medicine(s) are you calling about?"     Dupilumab (DUPIXENT) 300 MG/2ML 2. QUESTION: "What is your question?" (e.g., double dose of medicine, side effect)     Generalized itching s/p injection - denies site issues, swelling, redness 3. PRESCRIBER: "Who prescribed the medicine?" Reason: if prescribed by specialist, call should be referred  to that group.     Vergia Glasgow, MD 4. SYMPTOMS: "Do you have any symptoms?" If Yes, ask: "What symptoms are you having?"  "How bad are the symptoms (e.g., mild, moderate, severe)    Generalized "itching like crazy" pt reports taking benadryl with results Denies site redness, swelling  Protocols used: Medication Question Call-A-AH

## 2023-07-05 NOTE — Telephone Encounter (Signed)
 This is a Dr. Darnelle Elders patient. Dr. Viva Grise since Dr. Darnelle Elders is out of the office can you advise. Thank you!

## 2023-07-05 NOTE — Telephone Encounter (Signed)
 It appears that the itching resolved?  Could you please check with patient?  This is not unusual with Biologics.  We can check a CBC with differential to make sure she did not have eosinophilic reaction to the Dupixent.

## 2023-07-05 NOTE — Telephone Encounter (Signed)
 I spoke with the patient. She had the first Dupixent injection on Friday (4/11). The itching started that night. She took benadryl and was fine the next day. She is due to her next injection in 2 weeks. Should she continue to take it?  Dr. Darnelle Elders- she does know that you are out of the office. I told her I would give her a call back before her next injection is due. She will call our office if she has any other symptoms arise.

## 2023-07-06 ENCOUNTER — Other Ambulatory Visit: Payer: Self-pay

## 2023-07-06 ENCOUNTER — Telehealth: Payer: Self-pay | Admitting: Oncology

## 2023-07-06 ENCOUNTER — Ambulatory Visit: Admitting: Nurse Practitioner

## 2023-07-06 DIAGNOSIS — G992 Myelopathy in diseases classified elsewhere: Secondary | ICD-10-CM

## 2023-07-06 NOTE — Telephone Encounter (Signed)
 I have notified the patient. She will come in tomorrow for the lab work.  Nothing further needed.

## 2023-07-06 NOTE — Telephone Encounter (Signed)
 Pt left Vm requesting to be evaluated prior to her next appointment. She feels her levels are getting low.

## 2023-07-06 NOTE — Addendum Note (Signed)
 Addended by: Dontavius Keim on: 07/06/2023 10:35 AM   Modules accepted: Orders

## 2023-07-07 ENCOUNTER — Ambulatory Visit
Admission: RE | Admit: 2023-07-07 | Discharge: 2023-07-07 | Disposition: A | Discharge status: Home / Self Care | Source: Ambulatory Visit | Attending: Physician Assistant | Admitting: Physician Assistant

## 2023-07-07 ENCOUNTER — Telehealth: Payer: Self-pay | Admitting: *Deleted

## 2023-07-07 ENCOUNTER — Other Ambulatory Visit (HOSPITAL_COMMUNITY): Payer: Self-pay

## 2023-07-07 ENCOUNTER — Other Ambulatory Visit: Payer: Self-pay

## 2023-07-07 ENCOUNTER — Ambulatory Visit (INDEPENDENT_AMBULATORY_CARE_PROVIDER_SITE_OTHER): Payer: 59 | Admitting: Physician Assistant

## 2023-07-07 ENCOUNTER — Ambulatory Visit
Admission: RE | Admit: 2023-07-07 | Discharge: 2023-07-07 | Disposition: A | Discharge status: Home / Self Care | Attending: Physician Assistant | Admitting: Physician Assistant

## 2023-07-07 ENCOUNTER — Encounter: Payer: Self-pay | Admitting: Physician Assistant

## 2023-07-07 VITALS — BP 140/72 | Temp 98.9°F | Ht 63.0 in | Wt 121.0 lb

## 2023-07-07 DIAGNOSIS — Z981 Arthrodesis status: Secondary | ICD-10-CM | POA: Diagnosis not present

## 2023-07-07 DIAGNOSIS — M4802 Spinal stenosis, cervical region: Secondary | ICD-10-CM

## 2023-07-07 DIAGNOSIS — G992 Myelopathy in diseases classified elsewhere: Secondary | ICD-10-CM

## 2023-07-07 DIAGNOSIS — D500 Iron deficiency anemia secondary to blood loss (chronic): Secondary | ICD-10-CM

## 2023-07-07 NOTE — Telephone Encounter (Signed)
 Per Dr. Wilhelmenia Harada, we can recheck CBC and retic panel to evaluate hemoglobin. It's too soon to recheck iron levels.   Called pt x2 to let her know and to schedule lab appt. No answer and VM full. If pt calls back, ok to schedule lab only.

## 2023-07-07 NOTE — Progress Notes (Unsigned)
   REFERRING PHYSICIAN:  Laurence Pons, Np [address] [place],  [place] [address]  DOS: 03/02/23, C3-6 posterior cervical lami and fusion   HISTORY OF PRESENT ILLNESS: Summer Murphy is here after her cervical spinal washout.  She had multi level/multi location infections including psoas infections and wound infection.  Had a washout.  She has been physical therapy and rehab ever since.  Has been discharged 2 weeks ago.  She has lost a significant amount of weight approximately 20 pounds.  She does feel like her strength is improved but she does continue to complain of neck pain which she has been a persistent issue for her.  In regards to her left upper extremity numbness and tingling this also remains persistent.    Left arm does have some numbness and tingling but overall doing well  Not as much pain as she was in the beginning   Has not been seen since January.    Little improvement arms but still gets numb and tingling  worse in left arm. Mostly down left hand and into  into all fingers  Walking and driving.  Discharged from PT   PHYSICAL EXAMINATION:  NEUROLOGICAL:  General: In no acute distress.   Awake, alert, oriented to person, place, and time.  Pupils equal round and reactive to light.  Facial tone is symmetric.    Strength: Side Biceps Triceps Deltoid Interossei Grip Wrist Ext. Wrist Flex.  R 5 5 5  4+ 4+ 4+ 4+  L 5 5 5 4 4 4 4    Incision c/d/I. Healing well minimal surrounding erythema.  No drainage.    Assessment / Plan: Summer Murphy is one month status post C3-6 cervical lami and fusion.  She had wound washout and multi level/multi location infection including spine infection and psoas infections.  She is currently being treated by infectious disease.  She is also getting infusions for low iron.  She continues to have neck pain however her mobility is improving.  Her upper extremity strength is improving continuously.  We reiterated again today that  her myelomalacia that was present preoperatively will continue to be there and will often give her numbness and tingling in her left upper extremity.  This does not improve with decompression often.  In regards to the neck pain her posture is improving.  We did discuss that she most likely have neck pain given her significant level of spondylosis and arthritis, and that to get her back into a full anatomic position would require a multilevel front back surgery which she is not interested in nor do we feel like would be well indicated that especially in her deconditioned and malnourished state with the none planned weight loss.  We discussed conservative care physical therapy, ice and heat as well as possible TENS unit.  Will plan to reach out to her physical therapist to see whether or not that they utilize TENS therapy.  She can continue to follow-up.  Will reach out to her once her x-rays have been formally read.    Advised to contact the office if any questions or concerns arise.   Cydne Doyne MD Dept of Neurosurgery

## 2023-07-07 NOTE — Telephone Encounter (Signed)
 Summer Murphy called her yest. Pt. Called today and Summer Murphy called her with date and time for a lab appt.

## 2023-07-08 ENCOUNTER — Ambulatory Visit: Admitting: Nurse Practitioner

## 2023-07-08 ENCOUNTER — Inpatient Hospital Stay

## 2023-07-08 ENCOUNTER — Encounter: Payer: Self-pay | Admitting: Nurse Practitioner

## 2023-07-08 VITALS — BP 120/72 | HR 93 | Temp 98.8°F | Resp 16 | Ht 63.0 in | Wt 121.4 lb

## 2023-07-08 DIAGNOSIS — M4802 Spinal stenosis, cervical region: Secondary | ICD-10-CM

## 2023-07-08 DIAGNOSIS — E876 Hypokalemia: Secondary | ICD-10-CM | POA: Diagnosis not present

## 2023-07-08 DIAGNOSIS — J4541 Moderate persistent asthma with (acute) exacerbation: Secondary | ICD-10-CM | POA: Diagnosis not present

## 2023-07-08 DIAGNOSIS — Z760 Encounter for issue of repeat prescription: Secondary | ICD-10-CM | POA: Diagnosis not present

## 2023-07-08 DIAGNOSIS — D509 Iron deficiency anemia, unspecified: Secondary | ICD-10-CM | POA: Diagnosis not present

## 2023-07-08 DIAGNOSIS — F3342 Major depressive disorder, recurrent, in full remission: Secondary | ICD-10-CM

## 2023-07-08 DIAGNOSIS — D500 Iron deficiency anemia secondary to blood loss (chronic): Secondary | ICD-10-CM

## 2023-07-08 LAB — CBC WITH DIFFERENTIAL (CANCER CENTER ONLY)
Abs Immature Granulocytes: 0.03 10*3/uL (ref 0.00–0.07)
Basophils Absolute: 0.1 10*3/uL (ref 0.0–0.1)
Basophils Relative: 1 %
Eosinophils Absolute: 0.4 10*3/uL (ref 0.0–0.5)
Eosinophils Relative: 5 %
HCT: 37.5 % (ref 36.0–46.0)
Hemoglobin: 11.7 g/dL — ABNORMAL LOW (ref 12.0–15.0)
Immature Granulocytes: 0 %
Lymphocytes Relative: 18 %
Lymphs Abs: 1.4 10*3/uL (ref 0.7–4.0)
MCH: 27.5 pg (ref 26.0–34.0)
MCHC: 31.2 g/dL (ref 30.0–36.0)
MCV: 88.2 fL (ref 80.0–100.0)
Monocytes Absolute: 0.5 10*3/uL (ref 0.1–1.0)
Monocytes Relative: 6 %
Neutro Abs: 5.4 10*3/uL (ref 1.7–7.7)
Neutrophils Relative %: 70 %
Platelet Count: 279 10*3/uL (ref 150–400)
RBC: 4.25 MIL/uL (ref 3.87–5.11)
RDW: 17.8 % — ABNORMAL HIGH (ref 11.5–15.5)
WBC Count: 7.7 10*3/uL (ref 4.0–10.5)
nRBC: 0 % (ref 0.0–0.2)

## 2023-07-08 LAB — RETIC PANEL
Immature Retic Fract: 3.4 % (ref 2.3–15.9)
RBC.: 4.15 MIL/uL (ref 3.87–5.11)
Retic Count, Absolute: 44 10*3/uL (ref 19.0–186.0)
Retic Ct Pct: 1.1 % (ref 0.4–3.1)
Reticulocyte Hemoglobin: 31.6 pg (ref >27.9)

## 2023-07-08 LAB — SAMPLE TO BLOOD BANK

## 2023-07-08 MED ORDER — SUMATRIPTAN SUCCINATE 100 MG PO TABS: 100.0000 mg | ORAL_TABLET | ORAL | 3 refills | Status: DC | PRN

## 2023-07-08 MED ORDER — TRAZODONE HCL 150 MG PO TABS: ORAL_TABLET | ORAL | 2 refills | Status: DC

## 2023-07-08 NOTE — Progress Notes (Signed)
 [facility] [address] [place], [place] [address]  Internal MEDICINE  Office Visit Note  Patient Name: Summer Murphy  [id number]  [id number]  Date of Service: 07/08/2023  Chief Complaint  Patient presents with   Diabetes   Gastroesophageal Reflux   Hypertension   Follow-up    HPI Summer Murphy presents for a follow-up visit for low potassium, anemia, and fatigue.  Hypokalemia -- potassium level is improved to normal range Mild anemia -- CBC has improved, hgb is still slightly low at 11.7 Still tired but still slightly anemic, may check vitamin levels at a later date if still fatigued.  Still on long term antibiotics for cervical spine abscess/infection.    Current Medication: Outpatient Encounter Medications as of 07/08/2023  Medication Sig Note   acetaminophen  (TYLENOL ) 500 MG tablet Take 2 tablets (1,000 mg total) by mouth every 6 (six) hours as needed. 04/22/2023: prn   acetaminophen -codeine (TYLENOL  #3) 300-30 MG tablet Take 1-2 tablets by mouth at bedtime. Patient reports taking Tylenol  3 at bedtime    alendronate  (FOSAMAX ) 70 MG tablet Take 1 tablet (70 mg total) by mouth once a week. Take with a full glass of water  on an empty stomach.    apixaban  (ELIQUIS ) 5 MG TABS tablet Take 1 tablet (5 mg total) by mouth 2 (two) times daily.    busPIRone  (BUSPAR ) 10 MG tablet Take 1 tablet (10 mg total) by mouth 2 (two) times daily.    cefadroxil  (DURICEF) 500 MG capsule Take 2 capsules (1,000 mg total) by mouth 2 (two) times daily.    clonazePAM  (KLONOPIN ) 1 MG tablet Take 0.5-1 tablets (0.5-1 mg total) by mouth 2 (two) times daily as needed for anxiety.    COMBIVENT  RESPIMAT 20-100 MCG/ACT AERS respimat 1 puff every 6 (six) hours.    diclofenac  Sodium (VOLTAREN ) 1 % GEL Apply 4 g topically 4 (four) times daily. (Patient taking differently: Apply 4 g topically 4 (four) times daily as needed (pain).) 04/22/2023: prn   DULoxetine  (CYMBALTA ) 60 MG capsule Take 1 capsule (60 mg  total) by mouth at bedtime.    Dupilumab  (DUPIXENT ) 300 MG/2ML SOAJ Inject 300 mg into the skin every 14 (fourteen) days. **loading dose completed in office on 07/02/23**    Fe Fum-Vit C-Vit B12-FA (TRIGELS-F FORTE) CAPS capsule Take 1 capsule by mouth 2 (two) times daily.    fluticasone -salmeterol (WIXELA INHUB) 250-50 MCG/ACT AEPB Inhale 1 puff into the lungs in the morning and at bedtime.    gabapentin  (NEURONTIN ) 300 MG capsule Take 1 capsule (300 mg total) by mouth 2 (two) times daily.    HYDROcodone -acetaminophen  (NORCO/VICODIN) 5-325 MG tablet Take 1 tablet by mouth 2 (two) times daily as needed for severe pain (pain score 7-10).    ipratropium-albuterol  (DUONEB) 0.5-2.5 (3) MG/3ML SOLN Take 3 mLs by nebulization every 6 (six) hours as needed.    methocarbamol  (ROBAXIN ) 750 MG tablet Take 1 tablet (750 mg total) by mouth every 8 (eight) hours as needed for muscle spasms.    montelukast  (SINGULAIR ) 10 MG tablet Take 1 tablet (10 mg total) by mouth at bedtime.    ondansetron  (ZOFRAN ) 4 MG tablet TAKE 1 TABLET BY MOUTH TWICE DAILY AS NEEDED    oxybutynin  (DITROPAN -XL) 10 MG 24 hr tablet Take 1 tablet (10 mg total) by mouth at bedtime.    pantoprazole  (PROTONIX ) 40 MG tablet Take 1 tablet (40 mg total) by mouth 2 (two) times daily.    polyethylene glycol (MIRALAX  / GLYCOLAX ) 17 g  packet Take 17 g by mouth 2 (two) times daily.    rOPINIRole  (REQUIP ) 1 MG tablet Take 1 tablet (1 mg total) by mouth at bedtime.    senna (SENOKOT) 8.6 MG TABS tablet Take 1 tablet (8.6 mg total) by mouth 2 (two) times daily as needed for mild constipation. 04/22/2023: prn   [DISCONTINUED] SUMAtriptan  (IMITREX ) 100 MG tablet Take 1 tablet (100 mg total) by mouth every 2 (two) hours as needed (for migraine headaches.). May repeat in 1 hours if headache persists or recurs.    [DISCONTINUED] traZODone  (DESYREL ) 150 MG tablet TAKE 1 TABLET(150 MG) BY MOUTH AT BEDTIME    potassium chloride  SA (KLOR-CON  M) 20 MEQ tablet Take 1  tablet (20 mEq total) by mouth 2 (two) times daily for 7 days.    SUMAtriptan  (IMITREX ) 100 MG tablet Take 1 tablet (100 mg total) by mouth every 2 (two) hours as needed (for migraine headaches.). May repeat in 1 hours if headache persists or recurs.    traZODone  (DESYREL ) 150 MG tablet TAKE 1 TABLET(150 MG) BY MOUTH AT BEDTIME    No facility-administered encounter medications on file as of 07/08/2023.    Surgical History: Past Surgical History:  Procedure Laterality Date   ABDOMINAL HYSTERECTOMY     AMPUTATION TOE Left 07/31/2016   Procedure: AMPUTATION TOE/MPJ 2nd toe;  Surgeon: Angel Barba, DPM;  Location: [facility] ORS;  Service: Podiatry;  Laterality: Left;   APPENDECTOMY  1990   BACK SURGERY     low back   BREAST SURGERY     bilateral breast reduction   CARDIAC ELECTROPHYSIOLOGY STUDY AND ABLATION     CERVICAL WOUND DEBRIDEMENT N/A 04/17/2023   Procedure: CERVICAL WOUND DEBRIDEMENT;  Surgeon: Berta Brittle, MD;  Location: [facility];  Service: Neurosurgery;  Laterality: N/A;   CHOLECYSTECTOMY  1990   COLONOSCOPY WITH PROPOFOL  N/A 01/04/2017   Procedure: COLONOSCOPY WITH PROPOFOL ;  Surgeon: Cassie Click, MD;  Location: [facility] ENDOSCOPY;  Service: Endoscopy;  Laterality: N/A;   CORNEAL TRANSPLANT     ESOPHAGOGASTRODUODENOSCOPY N/A 04/09/2021   Procedure: ESOPHAGOGASTRODUODENOSCOPY (EGD);  Surgeon: Selena Daily, MD;  Location: [facility] ENDOSCOPY;  Service: Gastroenterology;  Laterality: N/A;   ESOPHAGOGASTRODUODENOSCOPY (EGD) WITH PROPOFOL  N/A 01/04/2017   Procedure: ESOPHAGOGASTRODUODENOSCOPY (EGD) WITH PROPOFOL ;  Surgeon: Cassie Click, MD;  Location[facility];  Service: Endoscopy;  Laterality: N/A;   EXCISION BONE CYST Left 07/31/2016   Procedure: EXCISION BONE CYST/exostectomy 28124/left 2nd;  Surgeon: Angel Barba, DPM;  Location: [facility];  Service: Podiatry;  Laterality: Left;   EXTRACORPOREAL SHOCK WAVE LITHOTRIPSY Left 09/12/2015   Procedure: EXTRACORPOREAL SHOCK WAVE  LITHOTRIPSY (ESWL);  Surgeon: Dustin Gimenez, MD;  Location: [facility] ORS;  Service: Urology;  Laterality: Left;   FRACTURE SURGERY     left foot   GUM SURGERY Left    gum infection   HARDWARE REMOVAL Left 11/14/2020   Procedure: HARDWARE REMOVAL;  Surgeon: Molli Angelucci, MD;  Location: [facility] ORS;  Service: Orthopedics;  Laterality: Left;   HH repair     Fundoplication   JOINT REPLACEMENT Bilateral 2013,2014   total knees   LAPAROSCOPIC HYSTERECTOMY     LITHOTRIPSY     periprosthetic supracondylar fracture of left femur  02/16/2020   [facility]   POSTERIOR CERVICAL FUSION/FORAMINOTOMY N/A 03/02/2023   Procedure: C3-6 POSTERIOR CERVICAL LAMINECTOMY AND FUSION;  Surgeon: Carroll Clamp, MD;  Location: [facility] ORS;  Service: Neurosurgery;  Laterality: N/A;   TONSILLECTOMY     TOTAL KNEE REVISION Right 11/14/2019  Procedure: Revision patella and tibial polyethylene;  Surgeon: Molli Angelucci, MD;  Location: [facility] ORS;  Service: Orthopedics;  Laterality: Right;   URETEROSCOPY      Medical History: Past Medical History:  Diagnosis Date   Acid reflux    Anemia    Anxiety    a.) buspirone  BID + BZO PRN (clonazepam )   Arthritis    Asthma    Cervical disc disorder with radiculopathy of cervical region    Chronic pain syndrome    a.) on COT as prescribed by pain mangement   Chronic, continuous use of opioids    a.) followed by pain mangement   COPD (chronic obstructive pulmonary disease) (HCC)    DOE (dyspnea on exertion)    Hematuria    Hiatal hernia    History of kidney stones    Hypertension    Hypoglycemia    Insomnia    a.) uses Trazodone  PRN   LBBB (left bundle branch block)    Migraine    Murmur    Osteopenia    Palpitations    Pre-diabetes    Restless leg    a.) on ropinirole    Stenosis of cervical spine with myelopathy (HCC)    T2DM (type 2 diabetes mellitus) (HCC)     Family History: Family History  Problem Relation Age of Onset   Hypertension Mother    Stroke  Mother    Prostate cancer Father    Kidney Stones Father    Diabetes Brother    Hypertension Brother    Breast cancer Maternal Aunt    Kidney disease Neg Hx     Social History   Socioeconomic History   Marital status: Widowed    Spouse name: Not on file   Number of children: Not on file   Years of education: Not on file   Highest education level: Not on file  Occupational History   Not on file  Tobacco Use   Smoking status: Never    Passive exposure: Past   Smokeless tobacco: Never  Vaping Use   Vaping status: Never Used  Substance and Sexual Activity   Alcohol use: No   Drug use: No   Sexual activity: Not Currently  Other Topics Concern   Not on file  Social History Narrative   Lives at home alone. Relatives are the support person.    Social Drivers of Health   Financial Resource Strain: Medium Risk (05/27/2023)   Received from [facility]   Overall Financial Resource Strain (CARDIA)    Difficulty of Paying Living Expenses: Somewhat hard  Food Insecurity: Food Insecurity Present (05/27/2023)   Received from [facility]   Hunger Vital Sign    Worried About Running Out of Food in the Last Year: Never true    Ran Out of Food in the Last Year: Sometimes true  Transportation Needs: No Transportation Needs (05/27/2023)   Received from [facility] - Transportation    In the past 12 months, has lack of transportation kept you from medical appointments or from getting medications?: No    Lack of Transportation (Non-Medical): No  Physical Activity: Not on file  Stress: Not on file  Social Connections: Moderately Isolated (04/16/2023)   Social Connection and Isolation Panel [NHANES]    Frequency of Communication with Friends and Family: More than three times a week    Frequency of Social Gatherings with Friends and Family: Once a week    Attends  Religious Services: More than 4 times per year    Active Member of  Clubs or Organizations: No    Attends Banker Meetings: Never    Marital Status: Widowed  Intimate Partner Violence: Not At Risk (04/16/2023)   Humiliation, Afraid, Rape, and Kick questionnaire    Fear of Current or Ex-Partner: No    Emotionally Abused: No    Physically Abused: No    Sexually Abused: No      Review of Systems  Constitutional:  Positive for appetite change, fatigue and unexpected weight change. Negative for fever.  HENT:  Negative for congestion, mouth sores and postnasal drip.   Respiratory: Negative.  Negative for cough, chest tightness, shortness of breath and wheezing.   Cardiovascular: Negative.  Negative for chest pain and palpitations.  Genitourinary:  Negative for flank pain.  Musculoskeletal:  Positive for arthralgias, back pain and neck pain.  Neurological:        Forgetfulness  Psychiatric/Behavioral:  Positive for behavioral problems and sleep disturbance. Negative for self-injury and suicidal ideas. The patient is nervous/anxious.     Vital Signs: BP 120/72   Pulse 93   Temp 98.8 F (37.1 C)   Resp 16   Ht 5\' 3"  (1.6 m)   Wt 121 lb 6.4 oz (55.1 kg)   SpO2 95%   BMI 21.51 kg/m    Physical Exam Vitals reviewed.  Constitutional:      General: She is not in acute distress.    Appearance: Normal appearance. She is normal weight. She is not ill-appearing.  HENT:     Head: Normocephalic and atraumatic.  Eyes:     Pupils: Pupils are equal, round, and reactive to light.  Cardiovascular:     Rate and Rhythm: Normal rate and regular rhythm.  Pulmonary:     Effort: Pulmonary effort is normal. No respiratory distress.  Neurological:     Mental Status: She is alert and oriented to person, place, and time.  Psychiatric:        Mood and Affect: Mood normal.        Behavior: Behavior normal.        Assessment/Plan: 1. Hypokalemia (Primary) Repeat potassium level  - Potassium  2. Moderate persistent asthma with acute  exacerbation Recently started on dupixent    3. Spinal stenosis in cervical region Continue prn hydrocodone  as prescribed.   4. Medication refill Continue prn sumatriptan   - SUMAtriptan  (IMITREX ) 100 MG tablet; Take 1 tablet (100 mg total) by mouth every 2 (two) hours as needed (for migraine headaches.). May repeat in 1 hours if headache persists or recurs.  Dispense: 10 tablet; Refill: 3  5. Recurrent major depressive disorder, in full remission (HCC) Continue trazodone  as prescribed.  - traZODone  (DESYREL ) 150 MG tablet; TAKE 1 TABLET(150 MG) BY MOUTH AT BEDTIME  Dispense: 30 tablet; Refill: 2   General Counseling: Summer Murphy verbalizes understanding of the findings of todays visit and agrees with plan of treatment. I have discussed any further diagnostic evaluation that may be needed or ordered today. We also reviewed her medications today. she has been encouraged to call the office with any questions or concerns that should arise related to todays visit.    Orders Placed This Encounter  Procedures   Potassium    Meds ordered this encounter  Medications   traZODone  (DESYREL ) 150 MG tablet    Sig: TAKE 1 TABLET(150 MG) BY MOUTH AT BEDTIME    Dispense:  30 tablet    Refill:  2   SUMAtriptan  (IMITREX ) 100 MG tablet    Sig: Take 1 tablet (100 mg total) by mouth every 2 (two) hours as needed (for migraine headaches.). May repeat in 1 hours if headache persists or recurs.    Dispense:  10 tablet    Refill:  3    Return for previously scheduled, AWV, Summer Murphy PCP in july .   Total time spent:30 Minutes Time spent includes review of chart, medications, test results, and follow up plan with the patient.   [place] Controlled Substance Database was reviewed by me.  This patient was seen by Laurence Pons, FNP-C in collaboration with Dr. Verneta Gone as a part of collaborative care agreement.   Summer Banes R. Bobbi Burow, MSN, FNP-C Internal medicine

## 2023-07-11 ENCOUNTER — Encounter: Payer: Self-pay | Admitting: Nurse Practitioner

## 2023-07-12 ENCOUNTER — Other Ambulatory Visit: Payer: Self-pay

## 2023-07-12 ENCOUNTER — Other Ambulatory Visit
Admission: RE | Admit: 2023-07-12 | Discharge: 2023-07-12 | Disposition: A | Discharge status: Home / Self Care | Source: Ambulatory Visit | Attending: Student in an Organized Health Care Education/Training Program | Admitting: Student in an Organized Health Care Education/Training Program

## 2023-07-12 ENCOUNTER — Other Ambulatory Visit
Admission: RE | Admit: 2023-07-12 | Discharge: 2023-07-12 | Disposition: A | Discharge status: Home / Self Care | Source: Ambulatory Visit | Attending: Nurse Practitioner | Admitting: Nurse Practitioner

## 2023-07-12 DIAGNOSIS — J4489 Other specified chronic obstructive pulmonary disease: Secondary | ICD-10-CM | POA: Insufficient documentation

## 2023-07-12 DIAGNOSIS — E876 Hypokalemia: Secondary | ICD-10-CM | POA: Insufficient documentation

## 2023-07-12 LAB — CBC WITH DIFFERENTIAL/PLATELET
Abs Immature Granulocytes: 0.01 10*3/uL (ref 0.00–0.07)
Basophils Absolute: 0.1 10*3/uL (ref 0.0–0.1)
Basophils Relative: 1 %
Eosinophils Absolute: 0.3 10*3/uL (ref 0.0–0.5)
Eosinophils Relative: 6 %
HCT: 37.3 % (ref 36.0–46.0)
Hemoglobin: 11.9 g/dL — ABNORMAL LOW (ref 12.0–15.0)
Immature Granulocytes: 0 %
Lymphocytes Relative: 19 %
Lymphs Abs: 1.1 10*3/uL (ref 0.7–4.0)
MCH: 27.6 pg (ref 26.0–34.0)
MCHC: 31.9 g/dL (ref 30.0–36.0)
MCV: 86.5 fL (ref 80.0–100.0)
Monocytes Absolute: 0.3 10*3/uL (ref 0.1–1.0)
Monocytes Relative: 5 %
Neutro Abs: 3.9 10*3/uL (ref 1.7–7.7)
Neutrophils Relative %: 69 %
Platelets: 281 10*3/uL (ref 150–400)
RBC: 4.31 MIL/uL (ref 3.87–5.11)
RDW: 17.6 % — ABNORMAL HIGH (ref 11.5–15.5)
WBC: 5.7 10*3/uL (ref 4.0–10.5)
nRBC: 0 % (ref 0.0–0.2)

## 2023-07-12 LAB — POTASSIUM: Potassium: 4 mmol/L (ref 3.5–5.1)

## 2023-07-12 NOTE — Progress Notes (Signed)
 Specialty Pharmacy Initial Fill Coordination Note  Summer Murphy is a 68 y.o. female contacted today regarding initial fill of specialty medication(s) Dupilumab  (Dupixent )   Patient requested Delivery   Delivery date: 07/14/23   Verified address: [address]   [place] [address]   Medication will be filled on 07/13/23.   Patient is aware of $0 copayment.

## 2023-07-13 ENCOUNTER — Telehealth: Payer: Self-pay

## 2023-07-13 ENCOUNTER — Encounter: Payer: Self-pay | Admitting: Infectious Diseases

## 2023-07-13 ENCOUNTER — Ambulatory Visit: Attending: Infectious Diseases | Admitting: Infectious Diseases

## 2023-07-13 ENCOUNTER — Other Ambulatory Visit: Payer: Self-pay

## 2023-07-13 VITALS — BP 145/66 | HR 96 | Temp 97.9°F | Ht 63.0 in | Wt 122.0 lb

## 2023-07-13 DIAGNOSIS — D649 Anemia, unspecified: Secondary | ICD-10-CM

## 2023-07-13 DIAGNOSIS — Z79899 Other long term (current) drug therapy: Secondary | ICD-10-CM | POA: Diagnosis not present

## 2023-07-13 DIAGNOSIS — Z7983 Long term (current) use of bisphosphonates: Secondary | ICD-10-CM | POA: Diagnosis not present

## 2023-07-13 DIAGNOSIS — Z96653 Presence of artificial knee joint, bilateral: Secondary | ICD-10-CM | POA: Insufficient documentation

## 2023-07-13 DIAGNOSIS — Z7901 Long term (current) use of anticoagulants: Secondary | ICD-10-CM | POA: Insufficient documentation

## 2023-07-13 DIAGNOSIS — J4489 Other specified chronic obstructive pulmonary disease: Secondary | ICD-10-CM | POA: Diagnosis not present

## 2023-07-13 DIAGNOSIS — F419 Anxiety disorder, unspecified: Secondary | ICD-10-CM | POA: Insufficient documentation

## 2023-07-13 DIAGNOSIS — Z981 Arthrodesis status: Secondary | ICD-10-CM | POA: Diagnosis not present

## 2023-07-13 DIAGNOSIS — B9561 Methicillin susceptible Staphylococcus aureus infection as the cause of diseases classified elsewhere: Secondary | ICD-10-CM | POA: Diagnosis not present

## 2023-07-13 DIAGNOSIS — K2270 Barrett's esophagus without dysplasia: Secondary | ICD-10-CM | POA: Insufficient documentation

## 2023-07-13 DIAGNOSIS — R7881 Bacteremia: Secondary | ICD-10-CM

## 2023-07-13 DIAGNOSIS — M4626 Osteomyelitis of vertebra, lumbar region: Secondary | ICD-10-CM | POA: Insufficient documentation

## 2023-07-13 DIAGNOSIS — T8463XD Infection and inflammatory reaction due to internal fixation device of spine, subsequent encounter: Secondary | ICD-10-CM | POA: Diagnosis present

## 2023-07-13 DIAGNOSIS — K449 Diaphragmatic hernia without obstruction or gangrene: Secondary | ICD-10-CM | POA: Insufficient documentation

## 2023-07-13 DIAGNOSIS — I10 Essential (primary) hypertension: Secondary | ICD-10-CM | POA: Diagnosis not present

## 2023-07-13 DIAGNOSIS — M4646 Discitis, unspecified, lumbar region: Secondary | ICD-10-CM | POA: Diagnosis not present

## 2023-07-13 DIAGNOSIS — M4622 Osteomyelitis of vertebra, cervical region: Secondary | ICD-10-CM

## 2023-07-13 NOTE — Progress Notes (Signed)
 NAME: [name]  DOB: 25-Jul-1955  MRN: [id number]  Date/Time: 07/13/2023 9:27 AM   Subjective:  Follow up visit for MSSA infection of cervical spine ( at the fusion site) and lumbar spine  ?Summer Murphy is a 68 y.o. . with a history of cervical myelpathy for which she  underwent c3-c6 laminectomy and posterior fusion on 03/02/23, Asthma, COPD, barrettts esophagus, hiatus hernia, HTN,  B/l TKA [facility]04[facility]28/23 for MSSA bacteremia MRI of the cervical spine  showed collection both deep at the laminectomy bed and superficial at the site of surgical incision site Blood culture came back positive for MSSA and she was started on cefazolin  Pt was seen by neurosurgeon  and taken to the OR  on 04/17/23 and underwent irrigation and debridement.,  Culture was MSSA. Pt was also c/o b/l groin pain and leg pain and had MRI of the lumbar spine on 04/25/23 and it showed discitis-osteomyelitis at L3-L4 and L4-L5. Small amount of enhancing fluid in the anterior epidural space between the L3 and L4 levels with suspected epidural abscess in the left lateral recess at the level of the L4 vertebral body. 2. Bilateral psoas muscle abscesses. IR aspiration of that fluid was no growth  She could not tolerate Po rifampin  because of severe upper GI symptoms She was sent to SNF on 2/4 with 6 weeks of cefazolin  to finish on 06/07/23 But she was discharged from SNF on 05/18/23  because of insurance. She received weekly Dalbavancin X 3 doses at day surgery on 2/25, 3/4 and 3/11 She saw me on 3/18 and I started her on Po cefadroxil  1 gram BID for 8 weeks She is still taking it   Her inflammatory markers have improved but have not yet normalized.  She experiences ongoing pain in her upper neck radiating down her left arm, sometimes reaching a 'six a day' on the pain scale. She manages the pain with ice, heat, and muscle relaxers, which provide some relief. Her low back pain, previously associated with  the infection, has resolved.  Her appetite is good, and she is trying to regain weight, currently weighing 117 pounds, down from her usual 135 pounds. She drinks a Boost daily to help with nutrition.  She has had bilateral knee replacements, with the left knee currently causing some discomfort and bowing out.   Her potassium levels have normalized after previously being low.  She lives alone in an apartment but has a supportive network of friends and family who visit regularly.  Past Medical History:  Diagnosis Date   Acid reflux    Anemia    Anxiety    a.) buspirone  BID + BZO PRN (clonazepam )   Arthritis    Asthma    Cervical disc disorder with radiculopathy of cervical region    Chronic pain syndrome    a.) on COT as prescribed by pain mangement   Chronic, continuous use of opioids    a.) followed by pain mangement   COPD (chronic obstructive pulmonary disease) (HCC)    DOE (dyspnea on exertion)    Hematuria    Hiatal hernia    History of kidney stones    Hypertension    Hypoglycemia    Insomnia    a.) uses Trazodone  PRN   LBBB (left bundle branch block)    Migraine    Murmur    Osteopenia    Palpitations    Pre-diabetes    Restless leg    a.) on ropinirole   Stenosis of cervical spine with myelopathy (HCC)    T2DM (type 2 diabetes mellitus) (HCC)     Past Surgical History:  Procedure Laterality Date   ABDOMINAL HYSTERECTOMY     AMPUTATION TOE Left 07/31/2016   Procedure: AMPUTATION TOE/MPJ 2nd toe;  Surgeon: Angel Barba, DPM;  Location: [facility];  Service: Podiatry;  Laterality: Left;   APPENDECTOMY  1990   BACK SURGERY     low back   BREAST SURGERY     bilateral breast reduction   CARDIAC ELECTROPHYSIOLOGY STUDY AND ABLATION     CERVICAL WOUND DEBRIDEMENT N/A 04/17/2023   Procedure: CERVICAL WOUND DEBRIDEMENT;  Surgeon: Berta Brittle, MD;  Location: [facility];  Service: Neurosurgery;  Laterality: N/A;   CHOLECYSTECTOMY  1990   COLONOSCOPY WITH PROPOFOL  N/A  01/04/2017   Procedure: COLONOSCOPY WITH PROPOFOL ;  Surgeon: Cassie Click, MD;  Location: [facility] END[facility]COPY;  Service: Endoscopy;  Laterality: N/A;   CORNEAL TRANSPLANT     ESOPHAGOGASTRODUODENOSCOPY N/A 04/09/2021   Procedure: ESOPHAGOGASTRODUODENOSCOPY (EGD);  Surgeon: Selena Daily, MD;  Location: [facility] ENDOSCOPY;  Service: Gastroenterology;  Laterality: N/A;   ESOPHAGOGASTRODUODENOSCOPY (EGD) WITH PROPOFOL  N/A 01/04/2017   Procedure: ESOPHAGOGASTRODUODENOSCOPY (EGD) WITH PROPOFOL ;  Surgeon: Cassie Click, MD;  Location: [facility] END[facility]COPY;  Service: Endoscopy;  Laterality: N/A;   EXCISION BONE CYST Left 07/31/2016   Procedure: EXCISION BONE CYST/exostectomy 28124/left 2nd;  Surgeon: Angel Barba, DPM;  Location: [facility];  Service: Podiatry;  Laterality: Left;   EXTRACORPOREAL SHOCK WAVE LITHOTRIPSY Left 09/12/2015   Procedure: EXTRACORPOREAL SHOCK WAVE LITHOTRIPSY (ESWL);  Surgeon: Dustin Gimenez, MD;  Location: [facility];  Service: Urology;  Laterality: Left;   FRACTURE SURGERY     left foot   GUM SURGERY Left    gum infection   HARDWARE REMOVAL Left 11/14/2020   Procedure: HARDWARE REMOVAL;  Surgeon: Molli Angelucci, MD;  Location: [facility];  Service: Orthopedics;  Laterality: Left;   HH repair     Fundoplication   JOINT REPLACEMENT Bilateral 2013,2014   total knees   LAPAROSCOPIC HYSTERECTOMY     LITHOTRIPSY     periprosthetic supracondylar fracture of left femur  02/16/2020   [facility]   POSTERIOR CERVICAL FUSION/FORAMINOTOMY N/A 03/02/2023   Procedure: C3-6 POSTERIOR CERVICAL LAMINECTOMY AND FUSION;  Surgeon: Carroll Clamp, MD;  Location: [facility];  Service: Neurosurgery;  Laterality: N/A;   TONSILLECTOMY     TOTAL KNEE REVISION Right 11/14/2019   Procedure: Revision patella and tibial polyethylene;  Surgeon: Molli Angelucci, MD;  Location: [facility];  Service: Orthopedics;  Laterality: Right;   URETEROSCOPY      Social History   Socioeconomic History   Marital  status: Widowed    Spouse name: Not on file   Number of children: Not on file   Years of education: Not on file   Highest education level: Not on file  Occupational History   Not on file  Tobacco Use   Smoking status: Never    Passive exposure: Past   Smokeless tobacco: Never  Vaping Use   Vaping status: Never Used  Substance and Sexual Activity   Alcohol use: No   Drug use: No   Sexual activity: Not Currently  Other Topics Concern   Not on file  Social History Narrative   Lives at home alone. Relatives are the support person.    Social Drivers of Health   Financial Resource Strain: Medium Risk (05/27/2023)   Received from [facility]   Overall Financial  Resource Strain (CARDIA)    Difficulty of Paying Living Expenses: Somewhat hard  Food Insecurity: Food Insecurity Present (05/27/2023)   Received from [facility]   Hunger Vital Sign    Worried About Running Out of Food in the Last Year: Never true    Ran Out of Food in the Last Year: Sometimes true  Transportation Needs: No Transportation Needs (05/27/2023)   Received from [facility] - Transportation    In the past 12 months, has lack of transportation kept you from medical appointments or from getting medications?: No    Lack of Transportation (Non-Medical): No  Physical Activity: Not on file  Stress: Not on file  Social Connections: Moderately Isolated (04/16/2023)   Social Connection and Isolation Panel [NHANES]    Frequency of Communication with Friends and Family: More than three times a week    Frequency of Social Gatherings with Friends and Family: Once a week    Attends Religious Services: More than 4 times per year    Active Member of [employer] or Organizations: No    Attends Banker Meetings: Never    Marital Status: Widowed  Intimate Partner Violence: Not At Risk (04/16/2023)   Humiliation, Afraid, Rape, and Kick questionnaire    Fear of  Current or Ex-Partner: No    Emotionally Abused: No    Physically Abused: No    Sexually Abused: No    Family History  Problem Relation Age of Onset   Hypertension Mother    Stroke Mother    Prostate cancer Father    Kidney Stones Father    Diabetes Brother    Hypertension Brother    Breast cancer Maternal Aunt    Kidney disease Neg Hx    Allergies  Allergen Reactions   Librium [Chlordiazepoxide] Shortness Of Breath   Reglan [Metoclopramide] Hives and Other (See Comments)    hallucinations    Vanilla Shortness Of Breath    Pt reports allergy to vanilla extract only   Aspirin Hives   Nsaids Rash    Rash/flares asthma issues.   Azithromycin  Other (See Comments)    Unsure of what the reaction was.   Buprenorphine Hcl Other (See Comments)    Unsure of what the reaction was.    Flexeril  [Cyclobenzaprine ]     hallucinations   Morphine Other (See Comments)    Unsure of reaction   Sulfa Antibiotics     Other reaction(s): Unknown   Tolmetin     Other Reaction: Allergy   Amlodipine  Besylate Itching and Rash    arms, stomach and forehead   Iron  Nausea And Vomiting   I? Current Outpatient Medications  Medication Sig Dispense Refill   acetaminophen  (TYLENOL ) 500 MG tablet Take 2 tablets (1,000 mg total) by mouth every 6 (six) hours as needed. 100 tablet 2   acetaminophen -codeine (TYLENOL  #3) 300-30 MG tablet Take 1-2 tablets by mouth at bedtime. Patient reports taking Tylenol  3 at bedtime     alendronate  (FOSAMAX ) 70 MG tablet Take 1 tablet (70 mg total) by mouth once a week. Take with a full glass of water  on an empty stomach. 12 tablet 3   apixaban  (ELIQUIS ) 5 MG TABS tablet Take 1 tablet (5 mg total) by mouth 2 (two) times daily. 60 tablet 1   busPIRone  (BUSPAR ) 10 MG tablet Take 1 tablet (10 mg total) by mouth 2 (two) times daily. 60 tablet 2   cefadroxil  (DURICEF) 500 MG capsule Take 2  capsules (1,000 mg total) by mouth 2 (two) times daily. 120 capsule 1   clonazePAM   (KLONOPIN ) 1 MG tablet Take 0.5-1 tablets (0.5-1 mg total) by mouth 2 (two) times daily as needed for anxiety. 60 tablet 0   COMBIVENT  RESPIMAT 20-100 MCG/ACT AERS respimat 1 puff every 6 (six) hours.     diclofenac  Sodium (VOLTAREN ) 1 % GEL Apply 4 g topically 4 (four) times daily. (Patient taking differently: Apply 4 g topically 4 (four) times daily as needed (pain).) 350 g 1   DULoxetine  (CYMBALTA ) 60 MG capsule Take 1 capsule (60 mg total) by mouth at bedtime. 90 capsule 1   Dupilumab  (DUPIXENT ) 300 MG/2ML SOAJ Inject 300 mg into the skin every 14 (fourteen) days. **loading dose completed in office on 07/02/23** 12 mL 1   Fe Fum-Vit C-Vit B12-FA (TRIGELS-F FORTE) CAPS capsule Take 1 capsule by mouth 2 (two) times daily. 180 capsule 3   fluticasone -salmeterol (WIXELA INHUB) 250-50 MCG/ACT AEPB Inhale 1 puff into the lungs in the morning and at bedtime. 60 each 12   gabapentin  (NEURONTIN ) 300 MG capsule Take 1 capsule (300 mg total) by mouth 2 (two) times daily. 60 capsule 2   HYDROcodone -acetaminophen  (NORCO/VICODIN) 5-325 MG tablet Take 1 tablet by mouth 2 (two) times daily as needed for severe pain (pain score 7-10). 45 tablet 0   ipratropium-albuterol  (DUONEB) 0.5-2.5 (3) MG/3ML SOLN Take 3 mLs by nebulization every 6 (six) hours as needed.     methocarbamol  (ROBAXIN ) 750 MG tablet Take 1 tablet (750 mg total) by mouth every 8 (eight) hours as needed for muscle spasms. 90 tablet 5   montelukast  (SINGULAIR ) 10 MG tablet Take 1 tablet (10 mg total) by mouth at bedtime. 90 tablet 1   ondansetron  (ZOFRAN ) 4 MG tablet TAKE 1 TABLET BY MOUTH TWICE DAILY AS NEEDED 45 tablet 1   oxybutynin  (DITROPAN -XL) 10 MG 24 hr tablet Take 1 tablet (10 mg total) by mouth at bedtime. 30 tablet 5   pantoprazole  (PROTONIX ) 40 MG tablet Take 1 tablet (40 mg total) by mouth 2 (two) times daily. 180 tablet 1   polyethylene glycol (MIRALAX  / GLYCOLAX ) 17 g packet Take 17 g by mouth 2 (two) times daily.     rOPINIRole   (REQUIP ) 1 MG tablet Take 1 tablet (1 mg total) by mouth at bedtime. 90 tablet 1   senna (SENOKOT) 8.6 MG TABS tablet Take 1 tablet (8.6 mg total) by mouth 2 (two) times daily as needed for mild constipation. 30 tablet 0   SUMAtriptan  (IMITREX ) 100 MG tablet Take 1 tablet (100 mg total) by mouth every 2 (two) hours as needed (for migraine headaches.). May repeat in 1 hours if headache persists or recurs. 10 tablet 3   traZODone  (DESYREL ) 150 MG tablet TAKE 1 TABLET(150 MG) BY MOUTH AT BEDTIME 30 tablet 2   potassium chloride  SA (KLOR-CON  M) 20 MEQ tablet Take 1 tablet (20 mEq total) by mouth 2 (two) times daily for 7 days. 14 tablet 0   No current facility-administered medications for this visit.     Abtx:  Anti-infectives (From admission, onward)    None       REVIEW OF SYSTEMS:  Const: negative fever, negative chills, negative weight loss Eyes: negative diplopia or visual changes, negative eye pain ENT: negative coryza, negative sore throat Resp: negative cough, hemoptysis, dyspnea Cards: negative for chest pain, palpitations, lower extremity edema GU: negative for frequency, dysuria and hematuria GI: Negative for abdominal pain, diarrhea, bleeding, constipation Skin:  negative for rash and pruritus Heme: negative for easy bruising and gum/nose bleeding WG:NFAO pain Neurolo:negative for headaches, dizziness, vertigo, memory problems  Psych:  anxiety, depression  Endocrine: negative for thyroid , diabetes Allergy/Immunology- as above Objective:  VITALS:  BP (!) 145/66   Pulse 96   Temp 97.9 F (36.6 C) (Temporal)   Ht 5\' 3"  (1.6 m)   Wt 122 lb (55.3 kg)   SpO2 94%   BMI 21.61 kg/m   PHYSICAL EXAM:  General: Alert, cooperative, no distress, appears stated age.  Head: Normocephalic, without obvious abnormality, atraumatic. Eyes: Conjunctivae clear, anicteric sclerae. Pupils are equal ENT Nares normal. No drainage or sinus tenderness. Lips, mucosa, and tongue normal. No  Thrush Neck: Supple, symmetrical, no adenopathy, thyroid : non tender no carotid bruit and no JVD. Back: No CVA tenderness. Lungs: Clear to auscultation bilaterally. No Wheezing or Rhonchi. No rales. Heart: Regular rate and rhythm, no murmur, rub or gallop. Abdomen: Soft, non-tender,not distended. Bowel sounds normal. No masses Extremities: atraumatic, no cyanosis. No edema. No clubbing Skin: No rashes or lesions. Or bruising Lymph: Cervical, supraclavicular normal. Neurologic: Grossly non-focal Pertinent Labs Lab Results CBC    Component Value Date/Time   WBC 5.7 07/12/2023 1007   RBC 4.31 07/12/2023 1007   HGB 11.9 (L) 07/12/2023 1007   HGB 11.7 (L) 07/08/2023 1307   HGB 13.4 08/29/2021 1303   HCT 37.3 07/12/2023 1007   HCT 39.4 08/29/2021 1303   PLT 281 07/12/2023 1007   PLT 279 07/08/2023 1307   PLT 186 08/29/2021 1303   MCV 86.5 07/12/2023 1007   MCV 91 08/29/2021 1303   MCV 84 06/28/2014 1710   MCH 27.6 07/12/2023 1007   MCHC 31.9 07/12/2023 1007   RDW 17.6 (H) 07/12/2023 1007   RDW 12.6 08/29/2021 1303   RDW 16.7 (H) 06/28/2014 1710   LYMPHSABS 1.1 07/12/2023 1007   LYMPHSABS 2.0 08/29/2021 1303   LYMPHSABS 0.6 (L) 06/13/2013 0505   MONOABS 0.3 07/12/2023 1007   MONOABS 0.1 (L) 06/13/2013 0505   EOSABS 0.3 07/12/2023 1007   EOSABS 0.2 08/29/2021 1303   EOSABS 0.0 06/13/2013 0505   BASOSABS 0.1 07/12/2023 1007   BASOSABS 0.0 08/29/2021 1303   BASOSABS 0.0 06/13/2013 0505       Latest Ref Rng & Units 07/12/2023   10:14 AM 06/18/2023   11:57 AM 06/17/2023    9:39 AM  CMP  Glucose 70 - 99 mg/dL  130    BUN 8 - 23 mg/dL  15    Creatinine 8.65 - 1.00 mg/dL  7.84    Sodium 696 - 295 mmol/L  138    Potassium 3.5 - 5.1 mmol/L 4.0  4.6  4.2   Chloride 98 - 111 mmol/L  100    CO2 22 - 32 mmol/L  28    Calcium 8.9 - 10.3 mg/dL  9.5    Total Protein 6.5 - 8.1 g/dL  6.9    Total Bilirubin 0.0 - 1.2 mg/dL  0.4    Alkaline Phos 38 - 126 U/L  78    AST 15 - 41 U/L   20    ALT 0 - 44 U/L  13      ? Impression/Recommendation ?Recent c3-c6 laminectomy and posterior fusion on 03/02/23? MSSA bacteremia  due to cervical spine surgical site infection MRI showed superficial and deep abscess- underwent I/D on 04/17/23 and culture was MSSA Repeat blood culture from 1/26  and 2/2 were neg Pt was to have  IV cefazoin for total of 6 weeks until 3/17, but on 2/25 she was discharged from SNF due to insurance- So she received Iv dalbavance weekly dose ( 2/25, 1500mg , 3/4 1 grm, 3./11 - 1gm. Because of hardware added rifampin  300mg  Po BID but she was not able to to tolerate the rifampin  as she had severe nausea and poor appetite.  Hence it was discontinued.  On 06/08/23 she was  started on  PO cefadroxil  1 gram Q 12 -with a plan to give for 8 weeks because of hardware  After which she will be on cefadroxil  500mg  Po BID for a long time Pt does not want labs eSR/CRP today- will do it in 3 months    Newly diagnosed L4 and L4-L5 discitis and osteomyelitis - underwent aspiration of the collection on 04/25/23  but culture neg- Lumbar  pain is not an issue anymore     Anemia much improved- last one 11.9   Hiatus hernia h/o fundoplication Dysphagia has had esophageal dilatation Nov 2024 Biopsy no evidence of metaplasia ? Anxiety  on meds ? Hypokalemia- resolved  Asthma/COPD- followed by pulmonologist and started on dupilumab    ________________________________________________  Follow up 12 weeks

## 2023-07-13 NOTE — Patient Instructions (Signed)
 During your visit, we discussed the follow-up for your Staphylococcus aureus infection, which you have been managing with antibiotics. We also reviewed your anemia, asthma, COPD, and knee replacement status. Your current treatment plans and any necessary follow-up actions were outlined.  YOUR PLAN:  -STAPHYLOCOCCUS AUREUS INFECTION: This is a bacterial infection that you have been treating with antibiotics following your spinal surgery. Continue taking cefadroxil , one gram (two pills in the morning and two in the evening) until mid-May, then reduce to 500 mg (one pill in the morning and one in the evening). We will monitor your inflammatory markers and consult with a neurosurgeon for further imaging studies.  -OSTEOMYELITIS OF LUMBAR SPINE: This is an infection in the bones of your lower spine. Your low back pain has resolved, and you should continue the current antibiotic regimen as outlined for your Staphylococcus aureus infection.  -ANEMIA: This is a condition where you have a lower than normal number of red blood cells. Your hemoglobin levels have improved to 11.9. We will continue to monitor your levels and consider further infusions if necessary.  -ASTHMA: This is a condition that affects your airways and can cause difficulty breathing. Your symptoms have improved with dupilumab  injections. Continue with the injections as scheduled and follow up with your pulmonologist, Dr. Media Spikes, on April 25.  .  -KNEE REPLACEMENT: You have had both knees replaced, and your left knee is currently causing some discomfort and bowing. No immediate intervention is needed, but we will monitor your symptoms and consider an orthopedic consultation if they worsen.  INSTRUCTIONS:  Please continue with your current medications and follow the outlined treatment plans. Follow up with your pulmonologist,  on April 25 for your asthma. We will also monitor your inflammatory markers and hemoglobin levels in future  visits.follow up appt with me in 3 months

## 2023-07-13 NOTE — Telephone Encounter (Signed)
 Faxed [employer] [phone] for home health aide paperwork

## 2023-07-19 ENCOUNTER — Telehealth: Payer: Self-pay

## 2023-07-19 NOTE — Telephone Encounter (Signed)
 I called [employer] and asked about the patient's order for Tens unit.  They confirmed that it has been sent and the patient will receive order in a few days.  I called Summer Murphy, she is happy to hear that her TENS unit is on its way finally. She mentions some new mid to low back pain that started over the weekend. I advised her to rest over the next few days and if this does not resolve that we could look into getting her an appointment to discuss her low back pain.

## 2023-07-21 ENCOUNTER — Telehealth: Payer: Self-pay

## 2023-07-21 NOTE — Telephone Encounter (Signed)
 I spoke with the patient. She was scheduled to do her Dupixent  on 4/25. She is waiting to hear back from you before she takes it. She does know you are in the ICU and will respond as soon as you can.

## 2023-07-21 NOTE — Telephone Encounter (Signed)
 Copied from CRM [id number]. Topic: Clinical - Lab/Test Results >> Jul 21, 2023 11:06 AM Justina Oman C wrote: Reason for CRM: Patient [phone] would like test results on if she's allergic to the medication injection Dr. Darnelle Elders prescribed. Please call back.

## 2023-07-21 NOTE — Telephone Encounter (Signed)
 I have notified the patient. She will take it today or tomorrow.   Nothing further needed.

## 2023-07-22 ENCOUNTER — Other Ambulatory Visit: Payer: Self-pay | Admitting: Physician Assistant

## 2023-07-22 DIAGNOSIS — R9389 Abnormal findings on diagnostic imaging of other specified body structures: Secondary | ICD-10-CM

## 2023-07-22 NOTE — Progress Notes (Signed)
 Potential lucency seen on most recent cervical x-ray. CT scan ordered for further evaluation.

## 2023-07-27 ENCOUNTER — Inpatient Hospital Stay: Attending: Oncology

## 2023-07-27 DIAGNOSIS — D509 Iron deficiency anemia, unspecified: Secondary | ICD-10-CM | POA: Insufficient documentation

## 2023-07-28 ENCOUNTER — Encounter: Payer: Self-pay | Admitting: Oncology

## 2023-07-28 ENCOUNTER — Telehealth: Payer: Self-pay

## 2023-07-28 ENCOUNTER — Other Ambulatory Visit (HOSPITAL_COMMUNITY): Payer: Self-pay

## 2023-07-28 NOTE — Telephone Encounter (Signed)
 Try to call pt voice mail full

## 2023-07-29 ENCOUNTER — Ambulatory Visit
Admission: RE | Admit: 2023-07-29 | Discharge: 2023-07-29 | Disposition: A | Discharge status: Home / Self Care | Source: Ambulatory Visit | Attending: Physician Assistant | Admitting: Physician Assistant

## 2023-07-29 DIAGNOSIS — R9389 Abnormal findings on diagnostic imaging of other specified body structures: Secondary | ICD-10-CM | POA: Diagnosis present

## 2023-07-30 ENCOUNTER — Inpatient Hospital Stay

## 2023-07-30 ENCOUNTER — Encounter: Payer: Self-pay | Admitting: Oncology

## 2023-07-30 ENCOUNTER — Inpatient Hospital Stay (HOSPITAL_BASED_OUTPATIENT_CLINIC_OR_DEPARTMENT_OTHER): Admitting: Oncology

## 2023-07-30 VITALS — BP 131/68 | HR 81 | Temp 98.1°F | Resp 18 | Wt 121.9 lb

## 2023-07-30 DIAGNOSIS — D509 Iron deficiency anemia, unspecified: Secondary | ICD-10-CM | POA: Diagnosis present

## 2023-07-30 DIAGNOSIS — D500 Iron deficiency anemia secondary to blood loss (chronic): Secondary | ICD-10-CM

## 2023-07-30 LAB — RETIC PANEL
Immature Retic Fract: 6.5 % (ref 2.3–15.9)
RBC.: 4.57 MIL/uL (ref 3.87–5.11)
Retic Count, Absolute: 51.6 10*3/uL (ref 19.0–186.0)
Retic Ct Pct: 1.1 % (ref 0.4–3.1)
Reticulocyte Hemoglobin: 31.2 pg (ref >27.9)

## 2023-07-30 LAB — CBC WITH DIFFERENTIAL (CANCER CENTER ONLY)
Abs Immature Granulocytes: 0.03 10*3/uL (ref 0.00–0.07)
Basophils Absolute: 0.1 10*3/uL (ref 0.0–0.1)
Basophils Relative: 1 %
Eosinophils Absolute: 0.3 10*3/uL (ref 0.0–0.5)
Eosinophils Relative: 3 %
HCT: 39.4 % (ref 36.0–46.0)
Hemoglobin: 12.6 g/dL (ref 12.0–15.0)
Immature Granulocytes: 0 %
Lymphocytes Relative: 15 %
Lymphs Abs: 1.2 10*3/uL (ref 0.7–4.0)
MCH: 27.8 pg (ref 26.0–34.0)
MCHC: 32 g/dL (ref 30.0–36.0)
MCV: 87 fL (ref 80.0–100.0)
Monocytes Absolute: 0.5 10*3/uL (ref 0.1–1.0)
Monocytes Relative: 6 %
Neutro Abs: 6.3 10*3/uL (ref 1.7–7.7)
Neutrophils Relative %: 75 %
Platelet Count: 255 10*3/uL (ref 150–400)
RBC: 4.53 MIL/uL (ref 3.87–5.11)
RDW: 16.1 % — ABNORMAL HIGH (ref 11.5–15.5)
WBC Count: 8.4 10*3/uL (ref 4.0–10.5)
nRBC: 0 % (ref 0.0–0.2)

## 2023-07-30 LAB — IRON AND TIBC
Iron: 43 ug/dL (ref 28–170)
Saturation Ratios: 13 % (ref 10.4–31.8)
TIBC: 321 ug/dL (ref 250–450)
UIBC: 278 ug/dL

## 2023-07-30 LAB — FERRITIN: Ferritin: 71 ng/mL (ref 11–307)

## 2023-07-30 NOTE — Progress Notes (Signed)
 Hematology/Oncology Progress note [phone] [phone]    Patient Care Team: Laurence Pons, NP as PCP - General (Nurse Practitioner) Robin Chock, [facility] (Inactive) as Pharmacist (Pharmacist) Timmy Forbes, MD as Consulting Physician (Oncology) Vergia Glasgow, MD as Consulting Physician (Pulmonary Disease)  ASSESSMENT & PLAN:   Iron  deficiency anemia #Iron  deficiency anemia, Labs reviewed and discussed with patient. Lab Results  Component Value Date   HGB 12.6 07/30/2023   TIBC 321 07/30/2023   IRONPCTSAT 13 07/30/2023   FERRITIN 71 07/30/2023   Hemoglobin and iron  panel both improved.  Hold of IV Venofer    Orders Placed This Encounter  Procedures   Retic Panel    Standing Status:   Future    Expected Date:   11/30/2023    Expiration Date:   07/29/2024   Ferritin    Standing Status:   Future    Expected Date:   11/30/2023    Expiration Date:   07/29/2024   Iron  and TIBC    Standing Status:   Future    Expected Date:   11/30/2023    Expiration Date:   07/29/2024   CBC with Differential (Cancer Center Only)    Standing Status:   Future    Expected Date:   11/30/2023    Expiration Date:   07/29/2024   Follow up in 2 months  All questions were answered. The patient knows to call the clinic with any problems, questions or concerns.  Timmy Forbes, MD, PhD [facility] Hematology Oncology 07/30/2023   CHIEF COMPLAINTS/REASON FOR VISIT:  Follow-up iron  deficiency anemia  HISTORY OF PRESENTING ILLNESS:   Summer Murphy is a  68 y.o.  female with PMH listed below was seen in consultation at the request of  Laurence Pons, NP  for evaluation of iron  deficiency anemia.  Reviewed patient's previous labs.  Anemia is chronic, date back to August 2021.  09/16/20 TIBC 454, ferritin 12, iron  saturation 5,  10/11/20 Hemoglobin 10.5,   Patient history of Barrett's esophagus, has had multiple EGDs.  Recent EGDs listed below.  05/29/2020 upper EGD Barrett's  esophagus. Treated with argon plasma  coagulation (APC).   - Benign-appearing esophageal stenosis. Dilated.  - LA Grade B reflux esophagitis with no bleeding.  - An anti-reflux surgical site was found herniated  above the diaphragm.   07/24/2020 upper EGD showed Barrett's esophagus. Treated with argon plasma coagulation (APC). Benign-appearing esophageal stenosis. Dilated.      - An anti-reflux surgical site was found.   - Large hiatal hernia.   10/02/2020 upper EGD showed There is no endoscopic evidence of Barrett's esophagus. Biopsied. Large hiatal hernia, - An anti-reflux surgical site was found. - A large amount of food (residue) in the stomach.  Stomach high cardia biopsy showed cardio-oxyntic type mucosa with faveolar hyperplasia.  Esophagus biopsy showed squamous esophageal mucosa with chronic esophagitis and increased intraepithelial eosinophils.   She has had colonoscopy in 2018, procedure was aborted due to poor prep.   S/p  C3-6 posterior cervical laminectomy and fusion  04/16/2023 with sepsis after fall.  Workup revealed MSSA bacteremia and abscess/discitis at cervical fusion site.  Patient underwent drainage via neurosurgery and was placed on cefazolin  and rifampin .  Subsequently, PICC line placed for 6 weeks of IV cafazolin until 3/17 and then will be transitioned to PO cefadroxil    INTERVAL HISTORY CELENE KROH is a 68 y.o. female who has above history reviewed by me today presents for follow up visit for management of iron   deficiency anemia. Patient reports feeling well.  +improved fatigue  Denies hematochezia, hematuria, hematemesis, epistaxis, black tarry stool or easy bruising.    Review of Systems  Constitutional:  Positive for fatigue. Negative for appetite change, chills and fever.  HENT:   Negative for hearing loss and voice change.   Eyes:  Negative for eye problems.  Respiratory:  Negative for chest tightness and cough.   Cardiovascular:  Negative for chest pain.   Gastrointestinal:  Negative for abdominal distention, abdominal pain and blood in stool.  Endocrine: Negative for hot flashes.  Genitourinary:  Negative for difficulty urinating and frequency.   Musculoskeletal:  Positive for neck pain. Negative for arthralgias.  Skin:  Negative for itching and rash.  Neurological:  Negative for dizziness and extremity weakness.  Hematological:  Negative for adenopathy.  Psychiatric/Behavioral:  Negative for confusion.     MEDICAL HISTORY:  Past Medical History:  Diagnosis Date   Acid reflux    Anemia    Anxiety    a.) buspirone  BID + BZO PRN (clonazepam )   Arthritis    Asthma    Cervical disc disorder with radiculopathy of cervical region    Chronic pain syndrome    a.) on COT as prescribed by pain mangement   Chronic, continuous use of opioids    a.) followed by pain mangement   COPD (chronic obstructive pulmonary disease) (HCC)    DOE (dyspnea on exertion)    Hematuria    Hiatal hernia    History of kidney stones    Hypertension    Hypoglycemia    Insomnia    a.) uses Trazodone  PRN   LBBB (left bundle branch block)    Migraine    Murmur    Osteopenia    Palpitations    Pre-diabetes    Restless leg    a.) on ropinirole    Stenosis of cervical spine with myelopathy (HCC)    T2DM (type 2 diabetes mellitus) (HCC)     SURGICAL HISTORY: Past Surgical History:  Procedure Laterality Date   ABDOMINAL HYSTERECTOMY     AMPUTATION TOE Left 07/31/2016   Procedure: AMPUTATION TOE/MPJ 2nd toe;  Surgeon: Angel Barba, DPM;  Location: [facility] ORS;  Service: Podiatry;  Laterality: Left;   APPENDECTOMY  1990   BACK SURGERY     low back   BREAST SURGERY     bilateral breast reduction   CARDIAC ELECTROPHYSIOLOGY STUDY AND ABLATION     CERVICAL WOUND DEBRIDEMENT N/A 04/17/2023   Procedure: CERVICAL WOUND DEBRIDEMENT;  Surgeon: Berta Brittle, MD;  Location: [facility] ORS;  Service: Neurosurgery;  Laterality: N/A;   CHOLECYSTECTOMY  1990   COLONOSCOPY  WITH PROPOFOL  N/A 01/04/2017   Procedure: COLONOSCOPY WITH PROPOFOL ;  Surgeon: Cassie Click, MD;  Location[facility]COPY;  Service: Endoscopy;  Laterality: N/A;   CORNEAL TRANSPLANT     ESOPHAGOGASTRODUODENOSCOPY N/A 04/09/2021   Procedure: ESOPHAGOGASTRODUODENOSCOPY (EGD);  Surgeon: Selena Daily, MD;  Location: [facility] ENDOSCOPY;  Service: Gastroenterology;  Laterality: N/A;   ESOPHAGOGASTRODUODENOSCOPY (EGD) WITH PROPOFOL  N/A 01/04/2017   Procedure: ESOPHAGOGASTRODUODENOSCOPY (EGD) WITH PROPOFOL ;  Surgeon: Cassie Click, MD;  Location[facility] END[facility]COPY;  Service: Endoscopy;  Laterality: N/A;   EXCISION BONE CYST Left 07/31/2016   Procedure: EXCISION BONE CYST/exostectomy 28124/left 2nd;  Surgeon: Angel Barba, DPM;  Location: [facility] ORS;  Service: Podiatry;  Laterality: Left;   EXTRACORPOREAL SHOCK WAVE LITHOTRIPSY Left 09/12/2015   Procedure: EXTRACORPOREAL SHOCK WAVE LITHOTRIPSY (ESWL);  Surgeon: Dustin Gimenez, MD;  Location: [facility] ORS;  Service: Urology;  Laterality: Left;   FRACTURE SURGERY     left foot   GUM SURGERY Left    gum infection   HARDWARE REMOVAL Left 11/14/2020   Procedure: HARDWARE REMOVAL;  Surgeon: Molli Angelucci, MD;  Location: [facility] ORS;  Service: Orthopedics;  Laterality: Left;   HH repair     Fundoplication   JOINT REPLACEMENT Bilateral 2013,2014   total knees   LAPAROSCOPIC HYSTERECTOMY     LITHOTRIPSY     periprosthetic supracondylar fracture of left femur  02/16/2020   [facility]   POSTERIOR CERVICAL FUSION/FORAMINOTOMY N/A 03/02/2023   Procedure: C3-6 POSTERIOR CERVICAL LAMINECTOMY AND FUSION;  Surgeon: Carroll Clamp, MD;  Location: [facility] ORS;  Service: Neurosurgery;  Laterality: N/A;   TONSILLECTOMY     TOTAL KNEE REVISION Right 11/14/2019   Procedure: Revision patella and tibial polyethylene;  Surgeon: Molli Angelucci, MD;  Location: [facility] ORS;  Service: Orthopedics;  Laterality: Right;   URETEROSCOPY      SOCIAL HISTORY: Social History    Socioeconomic History   Marital status: Widowed    Spouse name: Not on file   Number of children: Not on file   Years of education: Not on file   Highest education level: Not on file  Occupational History   Not on file  Tobacco Use   Smoking status: Never    Passive exposure: Past   Smokeless tobacco: Never  Vaping Use   Vaping status: Never Used  Substance and Sexual Activity   Alcohol use: No   Drug use: No   Sexual activity: Not Currently  Other Topics Concern   Not on file  Social History Narrative   Lives at home alone. Relatives are the support person.    Social Drivers of Health   Financial Resource Strain: Medium Risk (05/27/2023)   [facility]   Overall Financial Resource Strain (CARDIA)    Difficulty of Paying Living Expenses: Somewhat hard  Food Insecurity: Food Insecurity Present (05/27/2023)   Received from [facility]   Hunger Vital Sign    Worried About Running Out of Food in the Last Year: Never true    Ran Out of Food in the Last Year: Sometimes true  Transportation Needs: No Transportation Needs (05/27/2023)   Received from [facility] - Transportation    In the past 12 months, has lack of transportation kept you from medical appointments or from getting medications?: No    Lack of Transportation (Non-Medical): No  Physical Activity: Not on file  Stress: Not on file  Social Connections: Moderately Isolated (04/16/2023)   Social Connection and Isolation Panel [NHANES]    Frequency of Communication with Friends and Family: More than three times a week    Frequency of Social Gatherings with Friends and Family: Once a week    Attends Religious Services: More than 4 times per year    Active Member of [employer] or Organizations: No    Attends Banker Meetings: Never    Marital Status: Widowed  Intimate Partner Violence: Not At Risk (04/16/2023)   Humiliation, Afraid, Rape,  and Kick questionnaire    Fear of Current or Ex-Partner: No    Emotionally Abused: No    Physically Abused: No    Sexually Abused: No    FAMILY HISTORY: Family History  Problem Relation Age of Onset   Hypertension Mother    Stroke Mother    Prostate cancer Father  Kidney Stones Father    Diabetes Brother    Hypertension Brother    Breast cancer Maternal Aunt    Kidney disease Neg Hx     ALLERGIES:  is allergic to librium [chlordiazepoxide], reglan [metoclopramide], vanilla, aspirin, nsaids, azithromycin , buprenorphine hcl, flexeril  [cyclobenzaprine ], morphine, sulfa antibiotics, tolmetin, amlodipine  besylate, and iron .  MEDICATIONS:  Current Outpatient Medications  Medication Sig Dispense Refill   acetaminophen  (TYLENOL ) 500 MG tablet Take 2 tablets (1,000 mg total) by mouth every 6 (six) hours as needed. 100 tablet 2   acetaminophen -codeine (TYLENOL  #3) 300-30 MG tablet Take 1-2 tablets by mouth at bedtime. Patient reports taking Tylenol  3 at bedtime     alendronate  (FOSAMAX ) 70 MG tablet Take 1 tablet (70 mg total) by mouth once a week. Take with a full glass of water  on an empty stomach. 12 tablet 3   apixaban  (ELIQUIS ) 5 MG TABS tablet Take 1 tablet (5 mg total) by mouth 2 (two) times daily. 60 tablet 1   busPIRone  (BUSPAR ) 10 MG tablet Take 1 tablet (10 mg total) by mouth 2 (two) times daily. 60 tablet 2   cefadroxil  (DURICEF) 500 MG capsule Take 2 capsules (1,000 mg total) by mouth 2 (two) times daily. 120 capsule 1   clonazePAM  (KLONOPIN ) 1 MG tablet Take 0.5-1 tablets (0.5-1 mg total) by mouth 2 (two) times daily as needed for anxiety. 60 tablet 0   COMBIVENT  RESPIMAT 20-100 MCG/ACT AERS respimat 1 puff every 6 (six) hours.     diclofenac  Sodium (VOLTAREN ) 1 % GEL Apply 4 g topically 4 (four) times daily. (Patient taking differently: Apply 4 g topically 4 (four) times daily as needed (pain).) 350 g 1   DULoxetine  (CYMBALTA ) 60 MG capsule Take 1 capsule (60 mg total) by  mouth at bedtime. 90 capsule 1   Dupilumab  (DUPIXENT ) 300 MG/2ML SOAJ Inject 300 mg into the skin every 14 (fourteen) days. **loading dose completed in office on 07/02/23** 12 mL 1   Fe Fum-Vit C-Vit B12-FA (TRIGELS-F FORTE) CAPS capsule Take 1 capsule by mouth 2 (two) times daily. 180 capsule 3   fluticasone -salmeterol (WIXELA INHUB) 250-50 MCG/ACT AEPB Inhale 1 puff into the lungs in the morning and at bedtime. 60 each 12   gabapentin  (NEURONTIN ) 300 MG capsule Take 1 capsule (300 mg total) by mouth 2 (two) times daily. 60 capsule 2   HYDROcodone -acetaminophen  (NORCO/VICODIN) 5-325 MG tablet Take 1 tablet by mouth 2 (two) times daily as needed for severe pain (pain score 7-10). 45 tablet 0   ipratropium-albuterol  (DUONEB) 0.5-2.5 (3) MG/3ML SOLN Take 3 mLs by nebulization every 6 (six) hours as needed.     methocarbamol  (ROBAXIN ) 750 MG tablet Take 1 tablet (750 mg total) by mouth every 8 (eight) hours as needed for muscle spasms. 90 tablet 5   montelukast  (SINGULAIR ) 10 MG tablet Take 1 tablet (10 mg total) by mouth at bedtime. 90 tablet 1   ondansetron  (ZOFRAN ) 4 MG tablet TAKE 1 TABLET BY MOUTH TWICE DAILY AS NEEDED 45 tablet 1   oxybutynin  (DITROPAN -XL) 10 MG 24 hr tablet Take 1 tablet (10 mg total) by mouth at bedtime. 30 tablet 5   pantoprazole  (PROTONIX ) 40 MG tablet Take 1 tablet (40 mg total) by mouth 2 (two) times daily. 180 tablet 1   polyethylene glycol (MIRALAX  / GLYCOLAX ) 17 g packet Take 17 g by mouth 2 (two) times daily.     potassium chloride  SA (KLOR-CON  M) 20 MEQ tablet Take 1 tablet (20 mEq total) by  mouth 2 (two) times daily for 7 days. 14 tablet 0   rOPINIRole  (REQUIP ) 1 MG tablet Take 1 tablet (1 mg total) by mouth at bedtime. 90 tablet 1   senna (SENOKOT) 8.6 MG TABS tablet Take 1 tablet (8.6 mg total) by mouth 2 (two) times daily as needed for mild constipation. 30 tablet 0   SUMAtriptan  (IMITREX ) 100 MG tablet Take 1 tablet (100 mg total) by mouth every 2 (two) hours as  needed (for migraine headaches.). May repeat in 1 hours if headache persists or recurs. 10 tablet 3   traZODone  (DESYREL ) 150 MG tablet TAKE 1 TABLET(150 MG) BY MOUTH AT BEDTIME 30 tablet 2   No current facility-administered medications for this visit.     PHYSICAL EXAMINATION: ECOG PERFORMANCE STATUS: 1 - Symptomatic but completely ambulatory Vitals:   07/30/23 1029  BP: 131/68  Pulse: 81  Resp: 18  Temp: 98.1 F (36.7 C)  SpO2: 98%   Filed Weights   07/30/23 1029  Weight: 121 lb 14.4 oz (55.3 kg)     Physical Exam Constitutional:      General: She is not in acute distress. HENT:     Head: Normocephalic.  Eyes:     General: No scleral icterus. Cardiovascular:     Rate and Rhythm: Normal rate and regular rhythm.  Pulmonary:     Effort: Pulmonary effort is normal. No respiratory distress.     Breath sounds: No wheezing.  Abdominal:     General: There is no distension.     Palpations: Abdomen is soft.  Musculoskeletal:        General: Normal range of motion.     Cervical back: Normal range of motion.  Skin:    General: Skin is warm and dry.     Findings: No erythema or rash.  Neurological:     Mental Status: She is alert and oriented to person, place, and time. Mental status is at baseline.     Cranial Nerves: No cranial nerve deficit.  Psychiatric:        Mood and Affect: Mood normal.     LABORATORY DATA:  I have reviewed the data as listed    Latest Ref Rng & Units 07/30/2023    9:56 AM 07/12/2023   10:07 AM 07/08/2023    1:07 PM  CBC  WBC 4.0 - 10.5 K/uL 8.4  5.7  7.7   Hemoglobin 12.0 - 15.0 g/dL 40.9  81.1  91.4   Hematocrit 36.0 - 46.0 % 39.4  37.3  37.5   Platelets 150 - 400 K/uL 255  281  279       Latest Ref Rng & Units 07/12/2023   10:14 AM 06/18/2023   11:57 AM 06/17/2023    9:39 AM  CMP  Glucose 70 - 99 mg/dL  782    BUN 8 - 23 mg/dL  15    Creatinine 9.56 - 1.00 mg/dL  2.13    Sodium 086 - 578 mmol/L  138    Potassium 3.5 - 5.1 mmol/L  4.0  4.6  4.2   Chloride 98 - 111 mmol/L  100    CO2 22 - 32 mmol/L  28    Calcium 8.9 - 10.3 mg/dL  9.5    Total Protein 6.5 - 8.1 g/dL  6.9    Total Bilirubin 0.0 - 1.2 mg/dL  0.4    Alkaline Phos 38 - 126 U/L  78    AST 15 - 41 U/L  20  ALT 0 - 44 U/L  13      Lab Results  Component Value Date   IRON  43 07/30/2023   TIBC 321 07/30/2023   FERRITIN 71 07/30/2023    RADIOGRAPHIC STUDIES: I have personally reviewed the radiological images as listed and agreed with the findings in the report. DG Cervical Spine 2 or 3 views Result Date: 07/22/2023 CLINICAL DATA:  Fusion March 02, 2023. Stenosis with cervical spine with myelopathy. Posterior neck pain. EXAM: CERVICAL SPINE - 2-3 VIEW COMPARISON:  Radiograph 05/25/2023 FINDINGS: Stable cervical spine alignment with broad-based reversal of normal lordosis. Status post posterior fusion C3 through C6. Questionable lucency about the C3 screws of 2-3 mm, more so on the left. Hardware is otherwise intact. Unchanged disc space narrowing and spurring from C3-C4 through C6-C7. Soft tissue thickening anterior to C3, C4 and C5 is likely due to artifact, and not seen on the swimmer's view. No evidence of fracture bony destructive change. IMPRESSION: 1. Stable cervical spine alignment. Status post posterior fusion C3 through C6. Questionable lucency about the C3 screws of 2-3 mm, more so on the left. Recommend attention on follow-up. 2. Unchanged multilevel degenerative disc disease. Electronically Signed   By: Chadwick Colonel M.D.   On: 07/22/2023 10:38   DG Chest 2 View Result Date: 06/18/2023 CLINICAL DATA:  Shortness of breath EXAM: CHEST - 2 VIEW COMPARISON:  06/15/2023 FINDINGS: The heart size and mediastinal contours are within normal limits. Hiatal hernia. No focal airspace consolidation, pleural effusion, or pneumothorax. Anterior right chest wall soft tissue calcification. The visualized skeletal structures are unremarkable. IMPRESSION: No  active cardiopulmonary disease. Electronically Signed   By: Leverne Reading D.O.   On: 06/18/2023 12:53   DG Chest 2 View Result Date: 06/15/2023 CLINICAL DATA:  Shortness of breath and cough. EXAM: CHEST - 2 VIEW COMPARISON:  Chest radiograph dated 04/16/2023. FINDINGS: No focal consolidation, pleural effusion or pneumothorax. The cardiac silhouette is within limits. Small hiatal hernia. Multiple surgical clips in the upper abdomen. Anterior chest wall soft tissue calcification. No acute osseous pathology. IMPRESSION: 1. No active cardiopulmonary disease. 2. Small hiatal hernia. Electronically Signed   By: Angus Bark M.D.   On: 06/15/2023 14:22   DG Cervical Spine 2 or 3 views Result Date: 06/09/2023 CLINICAL DATA:  Status post fusion cervical spine on 03/02/2023 EXAM: CERVICAL SPINE - 2-3 VIEW COMPARISON:  April 02, 2023 FINDINGS: Comparison with prior examination accounting for differences in technique and position there is no significant change. The status post posterior fusion of C3-C7. Hardware appears intact no evidence of hardware failure There is narrowing of the C3-C4 C4-C5 C5-C6 disc spaces. With mild anterior osteophytic changes. Prevertebral soft tissues normal C1-C2 articulation and predental space normal IMPRESSION: *Status post posterior fusion of C3-C7. Hardware appears intact no evidence of hardware failure. *Degenerative disc disease. Electronically Signed   By: Fredrich Jefferson M.D.   On: 06/09/2023 18:44

## 2023-07-30 NOTE — Assessment & Plan Note (Addendum)
#  Iron  deficiency anemia, Labs reviewed and discussed with patient. Lab Results  Component Value Date   HGB 12.6 07/30/2023   TIBC 321 07/30/2023   IRONPCTSAT 13 07/30/2023   FERRITIN 71 07/30/2023   Hemoglobin and iron  panel both improved.  Hold of IV Venofer

## 2023-08-02 ENCOUNTER — Other Ambulatory Visit: Payer: Self-pay

## 2023-08-02 NOTE — Progress Notes (Signed)
 Specialty Pharmacy Refill Coordination Note  Summer Murphy is a 68 y.o. female contacted today regarding refills of specialty medication(s) Dupilumab  (Dupixent )   Patient requested Delivery   Delivery date: 08/10/23   Verified address: [address]   [place] [facility] [place] [address]   Medication will be filled on 05.19.25.

## 2023-08-05 ENCOUNTER — Encounter: Payer: Self-pay | Admitting: Nurse Practitioner

## 2023-08-11 ENCOUNTER — Other Ambulatory Visit: Payer: Self-pay | Admitting: Nurse Practitioner

## 2023-08-11 ENCOUNTER — Other Ambulatory Visit: Payer: Self-pay | Admitting: Infectious Diseases

## 2023-08-11 DIAGNOSIS — J301 Allergic rhinitis due to pollen: Secondary | ICD-10-CM

## 2023-08-12 ENCOUNTER — Telehealth: Payer: Self-pay

## 2023-08-12 NOTE — Telephone Encounter (Signed)
 Patient called to ask about getting a posture brace. She is noticing slumping over at the shoulders.   What can we do for her? Does she need to be evaluated in office / have imaging or send to PT   [name] called the office to discuss her noticing slumping over and if she needed a postural brace. She is not in pain with her upper back, she is more so worried about the appearance of her slumping over.   She also talked about being unable to work her TENS unit that we prescribed for pain from [employer], I will call them to see if someone can call her about her device.

## 2023-08-17 ENCOUNTER — Other Ambulatory Visit: Payer: Self-pay | Admitting: Nurse Practitioner

## 2023-08-17 DIAGNOSIS — M816 Localized osteoporosis [Lequesne]: Secondary | ICD-10-CM

## 2023-08-17 NOTE — Telephone Encounter (Signed)
 Patient needs help setting up her TENS unit. I offered to see her as a nurse visit to help her troubleshoot her device.

## 2023-08-20 ENCOUNTER — Ambulatory Visit

## 2023-08-20 ENCOUNTER — Other Ambulatory Visit: Payer: Self-pay | Admitting: Internal Medicine

## 2023-08-20 VITALS — Wt 120.8 lb

## 2023-08-20 DIAGNOSIS — Y93C2 Activity, hand held interactive electronic device: Secondary | ICD-10-CM

## 2023-08-20 NOTE — Patient Instructions (Addendum)
   Press on button [employer] with a line)  Press the TENS to make sure you are in the correct mode. Press TENS button to get to Lmd setting.  Press + button on CH1 and CH2 until you feel the pulsing You can adjust the strength using the + and -   *If you cannot adjusting anything, it may be locked so you may need to press the unlock button.

## 2023-08-20 NOTE — Progress Notes (Signed)
 Summer Murphy came to the clinic today for some assistance with her TENS unit prescribed by Dr. Felipe Horton.  We walked through placement of electrodes, how to set up for pulse setting as she discussed with her PT.   Patient also received typed instructions which I will send as a mychart message for her records.   Brooke PA-C briefly discussed her recent imaging results with the patient. She will see Dr. Felipe Horton next Wednesday for a more in depth plan of treatment.

## 2023-08-25 ENCOUNTER — Ambulatory Visit (INDEPENDENT_AMBULATORY_CARE_PROVIDER_SITE_OTHER): Admitting: Neurosurgery

## 2023-08-25 ENCOUNTER — Other Ambulatory Visit: Payer: Self-pay | Admitting: Internal Medicine

## 2023-08-25 ENCOUNTER — Encounter: Payer: Self-pay | Admitting: Neurosurgery

## 2023-08-25 VITALS — BP 120/68 | Ht 63.0 in | Wt 123.6 lb

## 2023-08-25 DIAGNOSIS — I10 Essential (primary) hypertension: Secondary | ICD-10-CM

## 2023-08-25 DIAGNOSIS — E639 Nutritional deficiency, unspecified: Secondary | ICD-10-CM

## 2023-08-25 DIAGNOSIS — M542 Cervicalgia: Secondary | ICD-10-CM | POA: Diagnosis not present

## 2023-08-25 DIAGNOSIS — Z981 Arthrodesis status: Secondary | ICD-10-CM

## 2023-08-25 DIAGNOSIS — G992 Myelopathy in diseases classified elsewhere: Secondary | ICD-10-CM

## 2023-08-28 ENCOUNTER — Other Ambulatory Visit: Payer: Self-pay | Admitting: Nurse Practitioner

## 2023-08-28 DIAGNOSIS — Z79899 Other long term (current) drug therapy: Secondary | ICD-10-CM

## 2023-08-28 DIAGNOSIS — G63 Polyneuropathy in diseases classified elsewhere: Secondary | ICD-10-CM

## 2023-08-31 ENCOUNTER — Telehealth: Payer: Self-pay | Admitting: Neurosurgery

## 2023-08-31 NOTE — Telephone Encounter (Signed)
 Patient is calling that she has severe pain from her neck into her left shoulder blade. She is taking  tylenol  arthritis and a muscle relaxer, but it's not touching her pain. Can you call her in something just a little stronger. [facility] [address] [employer]

## 2023-09-03 MED ORDER — METHYLPREDNISOLONE 4 MG PO TBPK
ORAL_TABLET | ORAL | 0 refills | Status: DC
Start: 2023-09-03 — End: 2023-11-30

## 2023-09-03 NOTE — Telephone Encounter (Signed)
 Please let the patient know that we sent in a medrol  dosepak for her to take.

## 2023-09-03 NOTE — Telephone Encounter (Signed)
 Patient notifed

## 2023-09-06 ENCOUNTER — Other Ambulatory Visit: Payer: Self-pay | Admitting: Nurse Practitioner

## 2023-09-06 ENCOUNTER — Ambulatory Visit: Admitting: Nurse Practitioner

## 2023-09-06 ENCOUNTER — Encounter: Payer: Self-pay | Admitting: Nurse Practitioner

## 2023-09-06 VITALS — BP 139/80 | HR 70 | Temp 98.2°F | Resp 16 | Ht 63.0 in | Wt 122.4 lb

## 2023-09-06 DIAGNOSIS — G2581 Restless legs syndrome: Secondary | ICD-10-CM | POA: Diagnosis not present

## 2023-09-06 DIAGNOSIS — M4802 Spinal stenosis, cervical region: Secondary | ICD-10-CM

## 2023-09-06 DIAGNOSIS — K2100 Gastro-esophageal reflux disease with esophagitis, without bleeding: Secondary | ICD-10-CM

## 2023-09-06 DIAGNOSIS — G63 Polyneuropathy in diseases classified elsewhere: Secondary | ICD-10-CM

## 2023-09-06 DIAGNOSIS — Z79899 Other long term (current) drug therapy: Secondary | ICD-10-CM

## 2023-09-06 DIAGNOSIS — M5412 Radiculopathy, cervical region: Secondary | ICD-10-CM

## 2023-09-06 DIAGNOSIS — F418 Other specified anxiety disorders: Secondary | ICD-10-CM

## 2023-09-06 DIAGNOSIS — F3342 Major depressive disorder, recurrent, in full remission: Secondary | ICD-10-CM

## 2023-09-06 DIAGNOSIS — K449 Diaphragmatic hernia without obstruction or gangrene: Secondary | ICD-10-CM

## 2023-09-06 DIAGNOSIS — M064 Inflammatory polyarthropathy: Secondary | ICD-10-CM | POA: Diagnosis not present

## 2023-09-06 MED ORDER — ONDANSETRON HCL 4 MG PO TABS: ORAL_TABLET | ORAL | 1 refills | Status: DC

## 2023-09-06 MED ORDER — LIDOCAINE 5 % EX PTCH: 1.0000 | MEDICATED_PATCH | CUTANEOUS | 4 refills | Status: DC

## 2023-09-06 MED ORDER — GABAPENTIN 300 MG PO CAPS
300.0000 mg | ORAL_CAPSULE | Freq: Two times a day (BID) | ORAL | 5 refills | Status: DC
Start: 2023-09-06 — End: 2023-12-21

## 2023-09-06 MED ORDER — HYDROCODONE-ACETAMINOPHEN 5-325 MG PO TABS: 1.0000 | ORAL_TABLET | Freq: Two times a day (BID) | ORAL | 0 refills | Status: DC | PRN

## 2023-09-06 NOTE — Telephone Encounter (Signed)
 Please review and refuse and send other

## 2023-09-06 NOTE — Progress Notes (Signed)
 [facility] [address] [place], [place] [address]  Internal MEDICINE  Office Visit Note  Patient Name: Summer Murphy  [id number]  [id number]  Date of Service: 09/06/2023  Chief Complaint  Patient presents with   Acute Visit    Neck issues     HPI Summer Murphy presents for an acute sick visit for acute on chronic neck pain Neck pain is worse esp since ran out of gabapentin .  Needs refills of gabapentin .     Current Medication:  Outpatient Encounter Medications as of 09/06/2023  Medication Sig Note   lidocaine  (LIDODERM ) 5 % Place 1 patch onto the skin daily. To affected area of the neck. Remove & Discard patch within 12 hours or as directed by MD    acetaminophen  (TYLENOL ) 500 MG tablet Take 2 tablets (1,000 mg total) by mouth every 6 (six) hours as needed. 04/22/2023: prn   alendronate  (FOSAMAX ) 70 MG tablet TAKE 1 TABLET BY MOUTH WEEKLY  TAKE WITH A FULL GLASS OF WATER   ON AN EMPTY STOMACH    cefadroxil  (DURICEF) 500 MG capsule Take 1 capsule (500 mg total) by mouth 2 (two) times daily.    clonazePAM  (KLONOPIN ) 1 MG tablet Take 0.5-1 tablets (0.5-1 mg total) by mouth 2 (two) times daily as needed for anxiety.    COMBIVENT  RESPIMAT 20-100 MCG/ACT AERS respimat 1 puff every 6 (six) hours. (Patient taking differently: Inhale 1 puff into the lungs every 6 (six) hours. As needed)    diclofenac  Sodium (VOLTAREN ) 1 % GEL Apply 4 g topically 4 (four) times daily. (Patient taking differently: Apply 4 g topically 4 (four) times daily as needed (pain).) 04/22/2023: prn   DULoxetine  (CYMBALTA ) 60 MG capsule Take 1 capsule (60 mg total) by mouth at bedtime.    Dupilumab  (DUPIXENT ) 300 MG/2ML SOAJ Inject 300 mg into the skin every 14 (fourteen) days. **loading dose completed in office on 07/02/23**    ELIQUIS  5 MG TABS tablet TAKE 1 TABLET(5 MG) BY MOUTH TWICE DAILY    fluticasone -salmeterol (WIXELA INHUB) 250-50 MCG/ACT AEPB Inhale 1 puff into the lungs in the morning and at  bedtime.    gabapentin  (NEURONTIN ) 300 MG capsule Take 1 capsule (300 mg total) by mouth 2 (two) times daily.    HYDROcodone -acetaminophen  (NORCO/VICODIN) 5-325 MG tablet Take 1 tablet by mouth 2 (two) times daily as needed for severe pain (pain score 7-10).    ipratropium-albuterol  (DUONEB) 0.5-2.5 (3) MG/3ML SOLN Take 3 mLs by nebulization every 6 (six) hours as needed.    methocarbamol  (ROBAXIN ) 750 MG tablet Take 1 tablet (750 mg total) by mouth every 8 (eight) hours as needed for muscle spasms.    methylPREDNISolone  (MEDROL  DOSEPAK) 4 MG TBPK tablet Take as Directed, per package instructions (Patient not taking: Reported on 10/07/2023)    montelukast  (SINGULAIR ) 10 MG tablet TAKE 1 TABLET(10 MG) BY MOUTH AT BEDTIME    ondansetron  (ZOFRAN ) 4 MG tablet TAKE 1 TABLET BY MOUTH TWICE DAILY AS NEEDED    pantoprazole  (PROTONIX ) 40 MG tablet Take 1 tablet (40 mg total) by mouth 2 (two) times daily.    polyethylene glycol (MIRALAX  / GLYCOLAX ) 17 g packet Take 17 g by mouth 2 (two) times daily. (Patient taking differently: Take 17 g by mouth daily as needed.)    rOPINIRole  (REQUIP ) 1 MG tablet Take 1 tablet (1 mg total) by mouth at bedtime.    SUMAtriptan  (IMITREX ) 100 MG tablet Take 1 tablet (100 mg total) by mouth every 2 (two) hours  as needed (for migraine headaches.). May repeat in 1 hours if headache persists or recurs.    traZODone  (DESYREL ) 150 MG tablet TAKE 1 TABLET(150 MG) BY MOUTH AT BEDTIME    [DISCONTINUED] busPIRone  (BUSPAR ) 10 MG tablet Take 1 tablet (10 mg total) by mouth 2 (two) times daily.    [DISCONTINUED] gabapentin  (NEURONTIN ) 300 MG capsule Take 1 capsule (300 mg total) by mouth 2 (two) times daily.    [DISCONTINUED] ondansetron  (ZOFRAN ) 4 MG tablet TAKE 1 TABLET BY MOUTH TWICE DAILY AS NEEDED    [DISCONTINUED] oxybutynin  (DITROPAN -XL) 10 MG 24 hr tablet Take 1 tablet (10 mg total) by mouth at bedtime.    No facility-administered encounter medications on file as of 09/06/2023.       Medical History: Past Medical History:  Diagnosis Date   Acid reflux    Anemia    Anxiety    a.) buspirone  BID + BZO PRN (clonazepam )   Arthritis    Asthma    Cervical disc disorder with radiculopathy of cervical region    Chronic pain syndrome    a.) on COT as prescribed by pain mangement   Chronic, continuous use of opioids    a.) followed by pain mangement   COPD (chronic obstructive pulmonary disease) (HCC)    DOE (dyspnea on exertion)    Hematuria    Hiatal hernia    History of kidney stones    Hypertension    Hypoglycemia    Insomnia    a.) uses Trazodone  PRN   LBBB (left bundle branch block)    Migraine    Murmur    Osteopenia    Palpitations    Pre-diabetes    Restless leg    a.) on ropinirole    Stenosis of cervical spine with myelopathy (HCC)    T2DM (type 2 diabetes mellitus) (HCC)      Vital Signs: BP 139/80   Pulse 70   Temp 98.2 F (36.8 C)   Resp 16   Ht 5' 3 (1.6 m)   Wt 122 lb 6.4 oz (55.5 kg)   SpO2 94%   BMI 21.68 kg/m    Review of Systems  Constitutional:  Positive for appetite change, fatigue and unexpected weight change. Negative for fever.  HENT:  Negative for congestion, mouth sores and postnasal drip.   Respiratory: Negative.  Negative for cough, chest tightness, shortness of breath and wheezing.   Cardiovascular: Negative.  Negative for chest pain and palpitations.  Genitourinary:  Negative for flank pain.  Musculoskeletal:  Positive for arthralgias, back pain and neck pain.  Neurological:        Forgetfulness  Psychiatric/Behavioral:  Positive for behavioral problems and sleep disturbance. Negative for self-injury and suicidal ideas. The patient is nervous/anxious.     Physical Exam Vitals reviewed.  Constitutional:      General: She is not in acute distress.    Appearance: Normal appearance. She is normal weight. She is not ill-appearing.  HENT:     Head: Normocephalic and atraumatic.  Eyes:     Pupils: Pupils  are equal, round, and reactive to light.  Cardiovascular:     Rate and Rhythm: Normal rate and regular rhythm.  Pulmonary:     Effort: Pulmonary effort is normal. No respiratory distress.  Musculoskeletal:     Cervical back: Pain with movement present. Decreased range of motion.  Neurological:     Mental Status: She is alert and oriented to person, place, and time.  Psychiatric:  Mood and Affect: Mood normal.        Behavior: Behavior normal.       Assessment/Plan: 1. Cervical radicular pain (left severe) (Primary) Continue gabapentin  as prescribed and continue lidocaine  patches as needed , continue hydrocodone  as needed  - gabapentin  (NEURONTIN ) 300 MG capsule; Take 1 capsule (300 mg total) by mouth 2 (two) times daily.  Dispense: 60 capsule; Refill: 5 - HYDROcodone -acetaminophen  (NORCO/VICODIN) 5-325 MG tablet; Take 1 tablet by mouth 2 (two) times daily as needed for severe pain (pain score 7-10).  Dispense: 45 tablet; Refill: 0 - lidocaine  (LIDODERM ) 5 %; Place 1 patch onto the skin daily. To affected area of the neck. Remove & Discard patch within 12 hours or as directed by MD  Dispense: 30 patch; Refill: 4  2. Foraminal stenosis of cervical region (severe b/l C5/6; C6/7) Continue gabapentin  as prescribed and continue lidocaine  patches as needed  - gabapentin  (NEURONTIN ) 300 MG capsule; Take 1 capsule (300 mg total) by mouth 2 (two) times daily.  Dispense: 60 capsule; Refill: 5 - HYDROcodone -acetaminophen  (NORCO/VICODIN) 5-325 MG tablet; Take 1 tablet by mouth 2 (two) times daily as needed for severe pain (pain score 7-10).  Dispense: 45 tablet; Refill: 0 - lidocaine  (LIDODERM ) 5 %; Place 1 patch onto the skin daily. To affected area of the neck. Remove & Discard patch within 12 hours or as directed by MD  Dispense: 30 patch; Refill: 4  3. Spinal stenosis in cervical region Continue gabapentin  as prescribed and continue lidocaine  patches as needed. Continue hydrocodone  as  needed  - gabapentin  (NEURONTIN ) 300 MG capsule; Take 1 capsule (300 mg total) by mouth 2 (two) times daily.  Dispense: 60 capsule; Refill: 5 - HYDROcodone -acetaminophen  (NORCO/VICODIN) 5-325 MG tablet; Take 1 tablet by mouth 2 (two) times daily as needed for severe pain (pain score 7-10).  Dispense: 45 tablet; Refill: 0 - lidocaine  (LIDODERM ) 5 %; Place 1 patch onto the skin daily. To affected area of the neck. Remove & Discard patch within 12 hours or as directed by MD  Dispense: 30 patch; Refill: 4  4. Inflammatory polyarthropathy (HCC) Continue gabapentin  as prescribed and continue lidocaine  patches as needed  - gabapentin  (NEURONTIN ) 300 MG capsule; Take 1 capsule (300 mg total) by mouth 2 (two) times daily.  Dispense: 60 capsule; Refill: 5 - lidocaine  (LIDODERM ) 5 %; Place 1 patch onto the skin daily. To affected area of the neck. Remove & Discard patch within 12 hours or as directed by MD  Dispense: 30 patch; Refill: 4  5. Restless leg Continue gabapentin  as prescribed  - gabapentin  (NEURONTIN ) 300 MG capsule; Take 1 capsule (300 mg total) by mouth 2 (two) times daily.  Dispense: 60 capsule; Refill: 5  6. Hiatal hernia with gastroesophageal reflux disease and esophagitis Continue prn zofran  as prescribed  - ondansetron  (ZOFRAN ) 4 MG tablet; TAKE 1 TABLET BY MOUTH TWICE DAILY AS NEEDED  Dispense: 45 tablet; Refill: 1   General Counseling: Summer Murphy verbalizes understanding of the findings of todays visit and agrees with plan of treatment. I have discussed any further diagnostic evaluation that may be needed or ordered today. We also reviewed her medications today. she has been encouraged to call the office with any questions or concerns that should arise related to todays visit.    Counseling:    No orders of the defined types were placed in this encounter.   Meds ordered this encounter  Medications   gabapentin  (NEURONTIN ) 300 MG capsule    Sig: Take  1 capsule (300 mg total) by  mouth 2 (two) times daily.    Dispense:  60 capsule    Refill:  5   ondansetron  (ZOFRAN ) 4 MG tablet    Sig: TAKE 1 TABLET BY MOUTH TWICE DAILY AS NEEDED    Dispense:  45 tablet    Refill:  1    Please fill script today   HYDROcodone -acetaminophen  (NORCO/VICODIN) 5-325 MG tablet    Sig: Take 1 tablet by mouth 2 (two) times daily as needed for severe pain (pain score 7-10).    Dispense:  45 tablet    Refill:  0    Refill please   lidocaine  (LIDODERM ) 5 %    Sig: Place 1 patch onto the skin daily. To affected area of the neck. Remove & Discard patch within 12 hours or as directed by MD    Dispense:  30 patch    Refill:  4    Fill new script today    Return if symptoms worsen or fail to improve, for keep next regular scheduled follow up.  [place] Controlled Substance Database was reviewed by me for overdose risk score (ORS)  Time spent:30 Minutes Time spent with patient included reviewing progress notes, labs, imaging studies, and discussing plan for follow up.   This patient was seen by Mardy Maxin, FNP-C in collaboration with Dr. Sigrid Bathe as a part of collaborative care agreement.  Hadia Minier R. Maxin, MSN, FNP-C Internal Medicine

## 2023-09-10 ENCOUNTER — Other Ambulatory Visit: Payer: Self-pay

## 2023-09-13 ENCOUNTER — Other Ambulatory Visit: Payer: Self-pay

## 2023-09-13 ENCOUNTER — Other Ambulatory Visit: Payer: Self-pay | Admitting: Nurse Practitioner

## 2023-09-13 DIAGNOSIS — R3981 Functional urinary incontinence: Secondary | ICD-10-CM

## 2023-09-15 ENCOUNTER — Other Ambulatory Visit: Payer: Self-pay

## 2023-09-15 NOTE — Progress Notes (Signed)
 Specialty Pharmacy Refill Coordination Note  Summer Murphy is a 68 y.o. female contacted today regarding refills of specialty medication(s) Dupilumab  (Dupixent )   Patient requested Delivery   Delivery date: 09/17/23   Verified address: [address]  [facility] [address]   Medication will be filled on 09/16/23.

## 2023-09-19 NOTE — Progress Notes (Signed)
 REFERRING PHYSICIAN:  No referring provider defined for this encounter.  DOS:  03/02/23, C3-6 posterior cervical lami and fusion 04/17/23 cervical wound debridement   HISTORY OF PRESENT ILLNESS: Summer Murphy is 6 months status post C3-6 posterior cervical Lami and fusion followed by cervical wound debridement.  She has continued to have significant neck pain.  Unfortunately continues to lose weight as well.  Patient states that she does not want to eat because she is worried that she will gain weight.    PHYSICAL EXAMINATION:  NEUROLOGICAL:  General: In no acute distress.   Awake, alert, oriented to person, place, and time.  Pupils equal round and reactive to light.  Facial tone is symmetric.    Strength: Side Biceps Triceps Deltoid Interossei Grip Wrist Ext. Wrist Flex.  R 5 5 5  4+ 4+ 4+ 4+  L 5 5 5 4 4 4 4    Incision c/d/I. Healing well minimal surrounding erythema.  No drainage.  Narrative & Impression  CLINICAL DATA:  68 year old female with previous cervical spine surgery, C3 laminar screw lucency on x-ray.   EXAM: CT CERVICAL SPINE WITHOUT CONTRAST   TECHNIQUE: Multidetector CT imaging of the cervical spine was performed without intravenous contrast. Multiplanar CT image reconstructions were also generated.   RADIATION DOSE REDUCTION: This exam was performed according to the departmental dose-optimization program which includes automated exposure control, adjustment of the mA and/or kV according to patient size and/or use of iterative reconstruction technique.   COMPARISON:  Cervical spine CT 04/16/2023. Cervical spine MRI 04/16/2023.   FINDINGS: Alignment: Stable. Straightening and mild reversal of the normal cervical lordosis. Cervicothoracic junction alignment is within normal limits. Maintained posterior element alignment.   Skull base and vertebrae: Postoperative changes which are detailed below. Background bone mineralization within normal  limits. Visualized skull base is intact. No atlanto-occipital dissociation. Cervical vertebrae appear intact.   Soft tissues and spinal canal: Hardware streak artifact C3-C4 through C6-C7. Postoperative changes to the posterior paraspinal soft tissues. Otherwise negative visible noncontrast neck soft tissues.   Disc levels:   C2-C3: Chronic degenerative appearing facet ankylosis on the left, underlying facet hypertrophy.   C3-C4: Posterior decompression and fusion. Progressive lucency along both C3 laminar screws (on the left series 4, image 40). And lesser lucency surrounding the right C4 screw at the tip (series 6, image 48). No solid arthrodesis.   C4-C5: Posterior decompression and fusion. Right C4 laminar screws described above. No C5 screw loosening is identified. And there seems to be developing facet arthrodesis on the left series 4, image 38.   C5-C6: Posterior decompression and fusion. Loosening of both C6 laminar screws with abundant lucency such as on sagittal image 40 on the left. No arthrodesis.   C6-C7:  Stable chronic endplate and facet spurring.   C7-T1:  Stable mild endplate and left side facet spurring.   Upper chest: Visible upper thoracic levels appear intact. Lung apices appear hyperinflated but clear; minor left apical lung scarring.   Other: Cervicomedullary junction is within normal limits. Negative visible noncontrast brain parenchyma. TMJ degeneration.   IMPRESSION: 1. Sequelae of posterior decompression and bilateral laminar fusion hardware placement C3-C4 through C6-C7. 2. Loosening of the bilateral C3 and C6 laminar screws, also the right C4 screw. 3. No postoperative arthrodesis identified, except for possible developing left C4-C5 facet fusion. Underlying degenerative appearing facet ankylosis on the left at C2-C3.     Electronically Signed   By: VEAR Hurst M.D.   On: 08/19/2023 12:25  Assessment / Plan: Summer Murphy is 6  months status post C3-6 posterior cervical Lami and fusion followed by cervical wound debridement.  Functionally she has had an improvement however she does continue to have significant neck pain.  We ordered a CT scan to evaluate her instrumentation, it does show some pullout of the superior and inferior screw placements.  She unfortunately continues to lose weight due to poor nutrition, we discussed with her the importance of improving her nutritional status, she does state that she had been trying to lose weight again and had been avoiding eating.  We reiterated to her the importance of nutrition and her healing.  Will plan to make a referral to a nutritionist.  She continues to have significant osteoporosis which puts her at risk for pseudoarthrosis.  We have made a referral to the bone health team.  I like to see her back in approximately 3 months once we are able to improve her nutritional status as well as increase her bone health.  At this point I feel she would rapidly fail another spinal intervention.   Penne MICAEL Sharps, MD Dept of Neurosurgery

## 2023-09-20 ENCOUNTER — Encounter: Payer: Self-pay | Admitting: Physician Assistant

## 2023-09-20 ENCOUNTER — Ambulatory Visit (INDEPENDENT_AMBULATORY_CARE_PROVIDER_SITE_OTHER): Admitting: Physician Assistant

## 2023-09-20 DIAGNOSIS — M810 Age-related osteoporosis without current pathological fracture: Secondary | ICD-10-CM | POA: Diagnosis not present

## 2023-09-20 NOTE — Progress Notes (Signed)
 Office Visit Note   Patient: Summer Murphy           Date of Birth: [birth year]           MRN: [id number] Visit Date: 09/20/2023              Requested by: Claudene Penne ORN, MD [address] [place],  [place] [address] PCP: Liana Fish, NP   Assessment & Plan: Visit Diagnoses:  1. Age-related osteoporosis without current pathological fracture     Plan: Patient is a 68 year old woman who was referred by Dr. Claudene for evaluation of osteoporosis.  She is status post cervical fusion and has had an infection as well as loosening fixation and pullout of the screws.  This is secondary to osteoporosis.  She says she is currently on Fosamax .  She had a bone density but is unsure when her most recent one was done.  She has a BMI of 19.  She admits that she has trouble eating because she has a history of an eating disorder and lost weight after her surgery.  She has a history of familiar hip fracture in her father.  She also has severe reflux.  She does have some trouble tolerating the Fosamax .  I reviewed her chart for 45 minutes.  I talked to her seriously about her nutrition status.  We will draw vitamin D  add the lab as she is a difficult stick.  Also needs a PTH and TSH.  She is scheduled to see a nutritionist.  Based on this she has extreme high risk for fracture.  I will obtain a new bone density scan and get a vitamin D  I do think given that she is 73 she would be a candidate for a bone builder such as Evenity.  Fosamax  would be difficult because she does have a history of reflux and she is currently on this and I suspect her bone density will be worse.  Will talk to her once this is all done but I would prefer to put her on Evenity if not Evenity then Prolia.  Follow-Up Instructions: Return if symptoms worsen or fail to improve.   Orders:  No orders of the defined types were placed in this encounter.  No orders of the defined types were placed in this encounter.      Procedures: No procedures performed   Clinical Data: No additional findings.   Subjective: No chief complaint on file.   HPI patient is a 68 year old woman referred by Dr. Claudene.  For evaluation of osteoporosis.  She has had a history of cervical fusion with wound debridement.  CT scan did reveal loosening of the screws osteoporotic bone  Review of Systems  All other systems reviewed and are negative.    Objective: Vital Signs: There were no vitals taken for this visit.  Physical Exam Constitutional:      Appearance: Normal appearance.  Pulmonary:     Effort: Pulmonary effort is normal.     Breath sounds: Normal breath sounds.   Skin:    General: Skin is warm and dry.   Neurological:     General: No focal deficit present.     Mental Status: She is alert and oriented to person, place, and time.   Psychiatric:        Mood and Affect: Mood normal.        Behavior: Behavior normal.       Specialty Comments:  No specialty comments available.  Imaging: No results found.   PMFS History: Patient Active Problem List   Diagnosis Date Noted   Age-related osteoporosis without current pathological fracture 09/20/2023   Fall 04/28/2023   Vertebral osteomyelitis (HCC) 04/26/2023   Lumbar discitis 04/26/2023   Infection of cervical spine (HCC) 04/26/2023   MSSA bacteremia 04/19/2023   Surgical site infection 04/19/2023   Fever 04/16/2023   Falls 04/16/2023   Electrolyte abnormality 04/16/2023   Chronic pain syndrome    S/P cervical spinal fusion 04/12/2023   Cervical myelopathy (HCC) 03/02/2023   T2DM (type 2 diabetes mellitus) (HCC) 02/28/2023   Spinal stenosis in cervical region 02/04/2023   Stenosis of cervical spine with myelopathy (HCC) 02/04/2023   Cervical radicular pain (left severe) 12/10/2022   Foraminal stenosis of cervical region 11/25/2022   Cervical disc disorder with radiculopathy of cervical region 11/25/2022   Cervicogenic headache 11/25/2022    Chronic asthmatic bronchitis (HCC) 06/12/2022   Uncontrolled type 2 diabetes mellitus with hyperglycemia, without long-term current use of insulin  (HCC) 06/03/2022   Aspiration pneumonia (HCC) 06/02/2022   Anxiety and depression 06/02/2022   Essential hypertension 06/02/2022   Acute respiratory failure with hypoxia (HCC) 06/02/2022   Asthma 06/01/2022   DOE (dyspnea on exertion) 05/13/2022   Multifocal pneumonia 05/12/2022   Hypertensive urgency 05/12/2022   Sepsis (HCC) 05/12/2022   CAP (community acquired pneumonia) 04/24/2022   PNA (pneumonia) 04/24/2022   Hypocalcemia 07/31/2021   Esophageal dysphagia    Benign esophageal stricture    S/P hardware removal 11/14/2020   Iron  deficiency anemia 10/18/2020   Moderate persistent asthma with acute exacerbation 08/21/2020   S/P revision of total knee, right 11/14/2019   Seasonal allergic rhinitis due to pollen 09/20/2019   Callus of foot 08/13/2019   Candidal vaginitis 08/13/2019   Other symptoms and signs involving the nervous system 08/13/2019   Encounter for general adult medical examination with abnormal findings 08/13/2019   Restless leg 04/30/2019   Muscle cramps 04/02/2019   Diastolic dysfunction 12/14/2018   SOB (shortness of breath) 12/14/2018   Acid reflux 12/14/2018   Wheezing 12/14/2018   Impaired fasting glucose 12/14/2018   COPD with acute exacerbation (HCC) 09/28/2018   Acute upper respiratory infection 08/25/2018   Recurrent sinusitis 08/15/2018   Prediabetes 08/15/2018   Nonintractable headache 08/15/2018   Dysuria 08/15/2018   Migraine 04/22/2018   Nausea 04/22/2018   Polyneuropathy associated with underlying disease (HCC) 04/22/2018   Midline low back pain without sciatica 04/22/2018   Routine cervical smear 04/22/2018   HTN (hypertension) 07/01/2017   Acute bronchitis with asthma 07/01/2017   Allergic rhinitis 07/01/2017   Moderate asthma without complication 01/20/2016   Severe recurrent major  depression without psychotic features (HCC) 09/10/2014   COPD (chronic obstructive pulmonary disease) (HCC) 09/10/2014   Calculi, ureter 04/26/2013   Corneal graft malfunction 08/17/2012   Calculus of kidney 04/26/2012   Renal colic 04/26/2012   Urge incontinence 04/26/2012   Diaphragmatic hernia 10/27/2010   Barrett esophagus 07/07/2010   Past Medical History:  Diagnosis Date   Acid reflux    Anemia    Anxiety    a.) buspirone  BID + BZO PRN (clonazepam )   Arthritis    Asthma    Cervical disc disorder with radiculopathy of cervical region    Chronic pain syndrome    a.) on COT as prescribed by pain mangement   Chronic, continuous use of opioids    a.) followed by pain mangement   COPD (chronic obstructive pulmonary disease) (HCC)  DOE (dyspnea on exertion)    Hematuria    Hiatal hernia    History of kidney stones    Hypertension    Hypoglycemia    Insomnia    a.) uses Trazodone  PRN   LBBB (left bundle branch block)    Migraine    Murmur    Osteopenia    Palpitations    Pre-diabetes    Restless leg    a.) on ropinirole    Stenosis of cervical spine with myelopathy (HCC)    T2DM (type 2 diabetes mellitus) (HCC)     Family History  Problem Relation Age of Onset   Hypertension Mother    Stroke Mother    Prostate cancer Father    Kidney Stones Father    Diabetes Brother    Hypertension Brother    Breast cancer Maternal Aunt    Kidney disease Neg Hx     Past Surgical History:  Procedure Laterality Date   ABDOMINAL HYSTERECTOMY     AMPUTATION TOE Left 07/31/2016   Procedure: AMPUTATION TOE/MPJ 2nd toe;  Surgeon: Neill Boas, DPM;  Location: [facility] ORS;  Service: Podiatry;  Laterality: Left;   APPENDECTOMY  1990   BACK SURGERY     low back   BREAST SURGERY     bilateral breast reduction   CARDIAC ELECTROPHYSIOLOGY STUDY AND ABLATION     CERVICAL WOUND DEBRIDEMENT N/A 04/17/2023   Procedure: CERVICAL WOUND DEBRIDEMENT;  Surgeon: Bluford Standing, MD;  Location: [facility]  ORS;  Service: Neurosurgery;  Laterality: N/A;   CHOLECYSTECTOMY  1990   COLONOSCOPY WITH PROPOFOL  N/A 01/04/2017   Procedure: COLONOSCOPY WITH PROPOFOL ;  Surgeon: Viktoria Lamar DASEN, MD;  Location: [facility] ENDOSCOPY;  Service: Endoscopy;  Laterality: N/A;   CORNEAL TRANSPLANT     ESOPHAGOGASTRODUODENOSCOPY N/A 04/09/2021   Procedure: ESOPHAGOGASTRODUODENOSCOPY (EGD);  Surgeon: Unk Corinn Skiff, MD;  Location: [facility] ENDOSCOPY;  Service: Gastroenterology;  Laterality: N/A;   ESOPHAGOGASTRODUODENOSCOPY (EGD) WITH PROPOFOL  N/A 01/04/2017   Procedure: ESOPHAGOGASTRODUODENOSCOPY (EGD) WITH PROPOFOL ;  Surgeon: Viktoria Lamar DASEN, MD;  Location: [facility] ENDOSCOPY;  Service: Endoscopy;  Laterality: N/A;   EXCISION BONE CYST Left 07/31/2016   Procedure: EXCISION BONE CYST/exostectomy 28124/left 2nd;  Surgeon: Neill Boas, DPM;  Location: [facility] ORS;  Service: Podiatry;  Laterality: Left;   EXTRACORPOREAL SHOCK WAVE LITHOTRIPSY Left 09/12/2015   Procedure: EXTRACORPOREAL SHOCK WAVE LITHOTRIPSY (ESWL);  Surgeon: Rosina Riis, MD;  Location: [facility] ORS;  Service: Urology;  Laterality: Left;   FRACTURE SURGERY     left foot   GUM SURGERY Left    gum infection   HARDWARE REMOVAL Left 11/14/2020   Procedure: HARDWARE REMOVAL;  Surgeon: Kathlynn Sharper, MD;  Location: [facility] OR[facility];  Service: Orthopedics;  Laterality: Left;   HH repair     Fundoplication   JOINT REPLACEMENT Bilateral 2013,2014   total knees   LAPAROSCOPIC HYSTERECTOMY     LITHOTRIPSY     periprosthetic supracondylar fracture of left femur  02/16/2020   [facility]   POSTERIOR CERVICAL FUSION/FORAMINOTOMY N/A 03/02/2023   Procedure: C3-6 POSTERIOR CERVICAL LAMINECTOMY AND FUSION;  Surgeon: Claudene Riis ORN, MD;  Location: [facility] ORS;  Service: Neurosurgery;  Laterality: N/A;   TONSILLECTOMY     TOTAL KNEE REVISION Right 11/14/2019   Procedure: Revision patella and tibial polyethylene;  Surgeon: Kathlynn Sharper, MD;  Location: [facility] ORS;  Service:  Orthopedics;  Laterality: Right;   URETEROSCOPY     Social History   Occupational History   Not on file  Tobacco Use  Smoking status: Never    Passive exposure: Past   Smokeless tobacco: Never  Vaping Use   Vaping status: Never Used  Substance and Sexual Activity   Alcohol use: No   Drug use: No   Sexual activity: Not Currently

## 2023-09-22 ENCOUNTER — Telehealth: Payer: Self-pay | Admitting: Neurosurgery

## 2023-09-22 DIAGNOSIS — G8929 Other chronic pain: Secondary | ICD-10-CM

## 2023-09-22 DIAGNOSIS — Z981 Arthrodesis status: Secondary | ICD-10-CM

## 2023-09-22 DIAGNOSIS — G992 Myelopathy in diseases classified elsewhere: Secondary | ICD-10-CM

## 2023-09-22 NOTE — Telephone Encounter (Signed)
 Reviewed with Dr Claudene. OK to refill methocarbamol  if she is out. However, she needs to follow up with PCP for pain medication refills, and/or we can refer her to pain management. She just had a medrol  dosepack on 09/03/23. She needs to continue treatment for osteoporosis, nutrition, and tobacco cessation before she can have further surgery. He recommends ER if she is in so much pain that she cannot breathe.

## 2023-09-22 NOTE — Telephone Encounter (Signed)
 Patient called and states she is in so much pain, can barely breathe, and that there is a hard spot close by to her incision site. She states that her PCP prescribed Lidocaine  patches and Hydrocodone . Please advise.

## 2023-09-22 NOTE — Telephone Encounter (Addendum)
 I spoke with the patient. She reports that a friend put the lidocaine  patch on for her. She has 2 of them on the back of her neck (one right underneath the other). Her pain is in her neck and in her left shoulder blade. She was not having trouble breathing during our call.   She is taking norco, but she doesn't feel it is strong enough. She doesn't take more than 2 per day Gabapentin  300mg  twice per day Methocarbamol  750 twice per day  We discussed that the methocarbamol  is prescribed as 3 times a day, but she is only taking it twice a day. She will try taking it 3 times per day to see if this helps.   She is aware that she needs to do the things she discussed with Dr Claudene to be optimized for surgery. She is agreeable to a referral to the pain clinic to help manage her pain until she is optimized for surgery. I have placed this referral.  I provided her with the phone number to the [facility] to call to schedule her bone density scan.

## 2023-10-04 ENCOUNTER — Encounter: Payer: Self-pay | Admitting: Student in an Organized Health Care Education/Training Program

## 2023-10-04 ENCOUNTER — Ambulatory Visit (INDEPENDENT_AMBULATORY_CARE_PROVIDER_SITE_OTHER): Admitting: Student in an Organized Health Care Education/Training Program

## 2023-10-04 VITALS — BP 120/68 | HR 75 | Temp 98.2°F | Ht 63.0 in | Wt 122.4 lb

## 2023-10-04 DIAGNOSIS — J4540 Moderate persistent asthma, uncomplicated: Secondary | ICD-10-CM

## 2023-10-04 MED ORDER — CLOTRIMAZOLE 10 MG MT TROC: 10.0000 mg | Freq: Every day | OROMUCOSAL | 0 refills | Status: AC

## 2023-10-04 NOTE — Progress Notes (Signed)
 Assessment & Plan:   #Moderate persistent asthma without complication (Primary)  Patient with a history of asthma and is presenting for follow up. Given worsening symptoms despite maximal therapy. Previous workup has shown eosinophilia of up to 800, and IgE was not significantly elevated when last checked (174 in May of 2024). We initiated Dupilumab  during her prior visit which has dramatically improved her symptoms. Will continue wixela and work to step down therapy following PFT's on her follow up visit. Will also prescribe clotrimazole  for management of oral thrush.   - Pulmonary Function Test; Future - clotrimazole  (MYCELEX ) 10 MG troche; Take 1 tablet (10 mg total) by mouth 5 (five) times daily for 7 days.  Dispense: 35 tablet; Refill: 0 - Continue Dupilumab  - Continue Wixela   Return in about 3 months (around 01/04/2024).  I spent 34 minutes caring for this patient today, including preparing to see the patient, obtaining a medical history , reviewing a separately obtained history, performing a medically appropriate examination and/or evaluation, counseling and educating the patient/family/caregiver, ordering medications, tests, or procedures, documenting clinical information in the electronic health record, and independently interpreting results (not separately reported/billed) and communicating results to the patient/family/caregiver  Belva November, MD [facility]   End of visit medications:  Meds ordered this encounter  Medications   clotrimazole  (MYCELEX ) 10 MG troche    Sig: Take 1 tablet (10 mg total) by mouth 5 (five) times daily for 7 days.    Dispense:  35 tablet    Refill:  0     Current Outpatient Medications:    acetaminophen  (TYLENOL ) 500 MG tablet, Take 2 tablets (1,000 mg total) by mouth every 6 (six) hours as needed., Disp: 100 tablet, Rfl: 2   alendronate  (FOSAMAX ) 70 MG tablet, TAKE 1 TABLET BY MOUTH WEEKLY  TAKE WITH A FULL GLASS OF  WATER   ON AN EMPTY STOMACH, Disp: 12 tablet, Rfl: 3   busPIRone  (BUSPAR ) 10 MG tablet, TAKE 1 TABLET(10 MG) BY MOUTH TWICE DAILY, Disp: 60 tablet, Rfl: 2   cefadroxil  (DURICEF) 500 MG capsule, Take 1 capsule (500 mg total) by mouth 2 (two) times daily., Disp: 60 capsule, Rfl: 2   clonazePAM  (KLONOPIN ) 1 MG tablet, Take 0.5-1 tablets (0.5-1 mg total) by mouth 2 (two) times daily as needed for anxiety., Disp: 60 tablet, Rfl: 0   clotrimazole  (MYCELEX ) 10 MG troche, Take 1 tablet (10 mg total) by mouth 5 (five) times daily for 7 days., Disp: 35 tablet, Rfl: 0   COMBIVENT  RESPIMAT 20-100 MCG/ACT AERS respimat, 1 puff every 6 (six) hours. (Patient taking differently: Inhale 1 puff into the lungs every 6 (six) hours. As needed), Disp: , Rfl:    diclofenac  Sodium (VOLTAREN ) 1 % GEL, Apply 4 g topically 4 (four) times daily. (Patient taking differently: Apply 4 g topically 4 (four) times daily as needed (pain).), Disp: 350 g, Rfl: 1   DULoxetine  (CYMBALTA ) 60 MG capsule, Take 1 capsule (60 mg total) by mouth at bedtime., Disp: 90 capsule, Rfl: 1   Dupilumab  (DUPIXENT ) 300 MG/2ML SOAJ, Inject 300 mg into the skin every 14 (fourteen) days. **loading dose completed in office on 07/02/23**, Disp: 12 mL, Rfl: 1   ELIQUIS  5 MG TABS tablet, TAKE 1 TABLET(5 MG) BY MOUTH TWICE DAILY, Disp: 60 tablet, Rfl: 1   fluticasone -salmeterol (WIXELA INHUB) 250-50 MCG/ACT AEPB, Inhale 1 puff into the lungs in the morning and at bedtime., Disp: 60 each, Rfl: 12   gabapentin  (NEURONTIN ) 300 MG  capsule, Take 1 capsule (300 mg total) by mouth 2 (two) times daily., Disp: 60 capsule, Rfl: 5   HYDROcodone -acetaminophen  (NORCO/VICODIN) 5-325 MG tablet, Take 1 tablet by mouth 2 (two) times daily as needed for severe pain (pain score 7-10)., Disp: 45 tablet, Rfl: 0   ipratropium-albuterol  (DUONEB) 0.5-2.5 (3) MG/3ML SOLN, Take 3 mLs by nebulization every 6 (six) hours as needed., Disp: , Rfl:    lidocaine  (LIDODERM ) 5 %, Place 1 patch onto  the skin daily. To affected area of the neck. Remove & Discard patch within 12 hours or as directed by MD, Disp: 30 patch, Rfl: 4   methocarbamol  (ROBAXIN ) 750 MG tablet, Take 1 tablet (750 mg total) by mouth every 8 (eight) hours as needed for muscle spasms., Disp: 90 tablet, Rfl: 5   montelukast  (SINGULAIR ) 10 MG tablet, TAKE 1 TABLET(10 MG) BY MOUTH AT BEDTIME, Disp: 90 tablet, Rfl: 1   ondansetron  (ZOFRAN ) 4 MG tablet, TAKE 1 TABLET BY MOUTH TWICE DAILY AS NEEDED, Disp: 45 tablet, Rfl: 1   oxybutynin  (DITROPAN -XL) 10 MG 24 hr tablet, TAKE 1 TABLET(10 MG) BY MOUTH AT BEDTIME, Disp: 30 tablet, Rfl: 5   pantoprazole  (PROTONIX ) 40 MG tablet, Take 1 tablet (40 mg total) by mouth 2 (two) times daily., Disp: 180 tablet, Rfl: 1   polyethylene glycol (MIRALAX  / GLYCOLAX ) 17 g packet, Take 17 g by mouth 2 (two) times daily., Disp: , Rfl:    rOPINIRole  (REQUIP ) 1 MG tablet, Take 1 tablet (1 mg total) by mouth at bedtime., Disp: 90 tablet, Rfl: 1   SUMAtriptan  (IMITREX ) 100 MG tablet, Take 1 tablet (100 mg total) by mouth every 2 (two) hours as needed (for migraine headaches.). May repeat in 1 hours if headache persists or recurs., Disp: 10 tablet, Rfl: 3   traZODone  (DESYREL ) 150 MG tablet, TAKE 1 TABLET(150 MG) BY MOUTH AT BEDTIME, Disp: 30 tablet, Rfl: 2   methylPREDNISolone  (MEDROL  DOSEPAK) 4 MG TBPK tablet, Take as Directed, per package instructions (Patient not taking: Reported on 10/04/2023), Disp: 1 each, Rfl: 0   Subjective:   PATIENT ID: Summer Murphy Ao GENDER: female DOB: 1955-08-17, MRN: [id number]  Chief Complaint  Patient presents with   Follow-up    HPI  Patient is a pleasant 69 year old female with a past medical history of asthma presenting for follow up. Patient was previously seen by Dr. Darlean but has transitioned her care given he is no longer seeing patients in our [place] office.   Patient had established care in our clinic with Dr. Darlean for asthma. She's had a long standing  history of asthma, with previous PFT's showing obstruction (scooping on flow volume loop and FEV1 of 60% predicted - normal lung volumes, normal DLCO).  Return Visit 06/16/2023: Patient was last seen in clinic in September of 2024. During that visit, her symptoms were improved and we stepped down therapy from highest dose ICS to medium dose ICS. Since our last visit, she reported increased symptoms, and describes acute worsening around 10 days ago. She feels she might have aspirated and that resulted in shortness of breath, but when further questioned, the symptoms started at night and woke her up from sleep. She was not eating at the time. She's had increased cough and increased shortness of breath. There's also increase wheezing. She has not had any prednisone  since.  Return Visit 10/04/2023: Feels significant improvement in symptoms with Dupilumab . Her dyspnea, cough, and wheeze are near resolved. She is feeling her symptoms are  8/10 improved. She is tolerating dupilumab  injections well. She was not using her Wixela.    She was previously seen by surgery for her hiatal hernia and saw Dr. Jordis locally and was seen at [facility]. Given she's had two surgeries for her hiatal hernia, further interventions were deferred.   Ancillary information including prior medications, full medical/surgical/family/social histories, and PFTs (when available) are listed below and have been reviewed.   Review of Systems  Constitutional:  Negative for chills, fever and weight loss.  Respiratory:  Negative for cough, sputum production, shortness of breath and wheezing.      Objective:   Vitals:   10/04/23 1322  BP: 120/68  Pulse: 75  Temp: 98.2 F (36.8 C)  TempSrc: Oral  SpO2: 98%  Weight: 122 lb 6.4 oz (55.5 kg)  Height: 5' 3 (1.6 m)   98% on RA BMI Readings from Last 3 Encounters:  10/04/23 21.68 kg/m  09/06/23 21.68 kg/m  08/25/23 21.89 kg/m   Wt Readings from Last 3 Encounters:  10/04/23 122 lb 6.4  oz (55.5 kg)  09/06/23 122 lb 6.4 oz (55.5 kg)  08/25/23 123 lb 9.6 oz (56.1 kg)    Physical Exam Constitutional:      Appearance: Normal appearance. She is obese. She is not ill-appearing.  Pulmonary:     Effort: Pulmonary effort is normal.     Breath sounds: No wheezing, rhonchi or rales.  Abdominal:     Palpations: Abdomen is soft.  Neurological:     General: No focal deficit present.     Mental Status: She is alert and oriented to person, place, and time. Mental status is at baseline.       Ancillary Information    Past Medical History:  Diagnosis Date   Acid reflux    Anemia    Anxiety    a.) buspirone  BID + BZO PRN (clonazepam )   Arthritis    Asthma    Cervical disc disorder with radiculopathy of cervical region    Chronic pain syndrome    a.) on COT as prescribed by pain mangement   Chronic, continuous use of opioids    a.) followed by pain mangement   COPD (chronic obstructive pulmonary disease) (HCC)    DOE (dyspnea on exertion)    Hematuria    Hiatal hernia    History of kidney stones    Hypertension    Hypoglycemia    Insomnia    a.) uses Trazodone  PRN   LBBB (left bundle branch block)    Migraine    Murmur    Osteopenia    Palpitations    Pre-diabetes    Restless leg    a.) on ropinirole    Stenosis of cervical spine with myelopathy (HCC)    T2DM (type 2 diabetes mellitus) (HCC)      Family History  Problem Relation Age of Onset   Hypertension Mother    Stroke Mother    Prostate cancer Father    Kidney Stones Father    Diabetes Brother    Hypertension Brother    Breast cancer Maternal Aunt    Kidney disease Neg Hx      Past Surgical History:  Procedure Laterality Date   ABDOMINAL HYSTERECTOMY     AMPUTATION TOE Left 07/31/2016   Procedure: AMPUTATION TOE/MPJ 2nd toe;  Surgeon: Neill Boas, DPM;  Location: [facility];  Service: Podiatry;  Laterality: Left;   APPENDECTOMY  1990   BACK SURGERY     low back  BREAST SURGERY      bilateral breast reduction   CARDIAC ELECTROPHYSIOLOGY STUDY AND ABLATION     CERVICAL WOUND DEBRIDEMENT N/A 04/17/2023   Procedure: CERVICAL WOUND DEBRIDEMENT;  Surgeon: Bluford Standing, MD;  Location: [facility];  Service: Neurosurgery;  Laterality: N/A;   CHOLECYSTECTOMY  1990   COLONOSCOPY WITH PROPOFOL  N/A 01/04/2017   Procedure: COLONOSCOPY WITH PROPOFOL ;  Surgeon: Viktoria Lamar DASEN, MD;  Location: [facility] ENDOSCOPY;  Service: Endoscopy;  Laterality: N/A;   CORNEAL TRANSPLANT     ESOPHAGOGASTRODUODENOSCOPY N/A 04/09/2021   Procedure: ESOPHAGOGASTRODUODENOSCOPY (EGD);  Surgeon: Unk Corinn Skiff, MD;  Location[facility] END[facility]COPY;  Service: Gastroenterology;  Laterality: N/A;   ESOPHAGOGASTRODUODENOSCOPY (EGD) WITH PROPOFOL  N/A 01/04/2017   Procedure: ESOPHAGOGASTRODUODENOSCOPY (EGD) WITH PROPOFOL ;  Surgeon: Viktoria Lamar DASEN, MD;  Location: [facility] ENDOSCOPY;  Service: Endoscopy;  Laterality: N/A;   EXCISION BONE CYST Left 07/31/2016   Procedure: EXCISION BONE CYST/exostectomy 28124/left 2nd;  Surgeon: Neill Boas, DPM;  Location: [facility];  Service: Podiatry;  Laterality: Left;   EXTRACORPOREAL SHOCK WAVE LITHOTRIPSY Left 09/12/2015   Procedure: EXTRACORPOREAL SHOCK WAVE LITHOTRIPSY (ESWL);  Surgeon: Rosina Riis, MD;  Location: [facility];  Service: Urology;  Laterality: Left;   FRACTURE SURGERY     left foot   GUM SURGERY Left    gum infection   HARDWARE REMOVAL Left 11/14/2020   Procedure: HARDWARE REMOVAL;  Surgeon: Kathlynn Sharper, MD;  Location: [facility];  Service: Orthopedics;  Laterality: Left;   HH repair     Fundoplication   JOINT REPLACEMENT Bilateral 2013,2014   total knees   LAPAROSCOPIC HYSTERECTOMY     LITHOTRIPSY     periprosthetic supracondylar fracture of left femur  02/16/2020   [facility]   POSTERIOR CERVICAL FUSION/FORAMINOTOMY N/A 03/02/2023   Procedure: C3-6 POSTERIOR CERVICAL LAMINECTOMY AND FUSION;  Surgeon: Claudene Riis ORN, MD;  Location: [facility];  Service:  Neurosurgery;  Laterality: N/A;   TONSILLECTOMY     TOTAL KNEE REVISION Right 11/14/2019   Procedure: Revision patella and tibial polyethylene;  Surgeon: Kathlynn Sharper, MD;  Location: [facility];  Service: Orthopedics;  Laterality: Right;   URETEROSCOPY      Social History   Socioeconomic History   Marital status: Widowed    Spouse name: Not on file   Number of children: Not on file   Years of education: Not on file   Highest education level: Not on file  Occupational History   Not on file  Tobacco Use   Smoking status: Never    Passive exposure: Past   Smokeless tobacco: Never  Vaping Use   Vaping status: Never Used  Substance and Sexual Activity   Alcohol use: No   Drug use: No   Sexual activity: Not Currently  Other Topics Concern   Not on file  Social History Narrative   Lives at home alone. Relatives are the support person.    Social Drivers of Health   Financial Resource Strain: Medium Risk (05/27/2023)   Received from [facility]   Overall Financial Resource Strain (CARDIA)    Difficulty of Paying Living Expenses: Somewhat hard  Food Insecurity: Food Insecurity Present (05/27/2023)   Received from [facility]   Hunger Vital Sign    Within the past 12 months, you worried that your food would run out before you got the money to buy more.: Never true    Within the past 12 months, the food you bought just didn't last and you didn't have  money to get more.: Sometimes true  Transportation Needs: No Transportation Needs (05/27/2023)   Received from [facility] - Transportation    In the past 12 months, has lack of transportation kept you from medical appointments or from getting medications?: No    Lack of Transportation (Non-Medical): No  Physical Activity: Not on file  Stress: Not on file  Social Connections: Moderately Isolated (04/16/2023)   Social Connection and Isolation Panel    Frequency of  Communication with Friends and Family: More than three times a week    Frequency of Social Gatherings with Friends and Family: Once a week    Attends Religious Services: More than 4 times per year    Active Member of [employer] or Organizations: No    Attends Banker Meetings: Never    Marital Status: Widowed  Intimate Partner Violence: Not At Risk (04/16/2023)   Humiliation, Afraid, Rape, and Kick questionnaire    Fear of Current or Ex-Partner: No    Emotionally Abused: No    Physically Abused: No    Sexually Abused: No     Allergies  Allergen Reactions   Librium [Chlordiazepoxide] Shortness Of Breath   Reglan [Metoclopramide] Hives and Other (See Comments)    hallucinations    Vanilla Shortness Of Breath    Pt reports allergy to vanilla extract only   Aspirin Hives   Nsaids Rash    Rash/flares asthma issues.   Azithromycin  Other (See Comments)    Unsure of what the reaction was.   Buprenorphine Hcl Other (See Comments)    Unsure of what the reaction was.    Flexeril  [Cyclobenzaprine ]     hallucinations   Morphine Other (See Comments)    Unsure of reaction   Sulfa Antibiotics     Other reaction(s): Unknown   Tolmetin     Other Reaction: Allergy   Amlodipine  Besylate Itching and Rash    arms, stomach and forehead   Iron  Nausea And Vomiting     CBC    Component Value Date/Time   WBC 8.4 07/30/2023 0956   WBC 5.7 07/12/2023 1007   RBC 4.53 07/30/2023 0956   RBC 4.57 07/30/2023 0956   HGB 12.6 07/30/2023 0956   HGB 13.4 08/29/2021 1303   HCT 39.4 07/30/2023 0956   HCT 39.4 08/29/2021 1303   PLT 255 07/30/2023 0956   PLT 186 08/29/2021 1303   MCV 87.0 07/30/2023 0956   MCV 91 08/29/2021 1303   MCV 84 06/28/2014 1710   MCH 27.8 07/30/2023 0956   MCHC 32.0 07/30/2023 0956   RDW 16.1 (H) 07/30/2023 0956   RDW 12.6 08/29/2021 1303   RDW 16.7 (H) 06/28/2014 1710   LYMPHSABS 1.2 07/30/2023 0956   LYMPHSABS 2.0 08/29/2021 1303   LYMPHSABS 0.6 (L)  06/13/2013 0505   MONOABS 0.5 07/30/2023 0956   MONOABS 0.1 (L) 06/13/2013 0505   EOSABS 0.3 07/30/2023 0956   EOSABS 0.2 08/29/2021 1303   EOSABS 0.0 06/13/2013 0505   BASOSABS 0.1 07/30/2023 0956   BASOSABS 0.0 08/29/2021 1303   BASOSABS 0.0 06/13/2013 0505    Pulmonary Functions Testing Results:     No data to display          Outpatient Medications Prior to Visit  Medication Sig Dispense Refill   acetaminophen  (TYLENOL ) 500 MG tablet Take 2 tablets (1,000 mg total) by mouth every 6 (six) hours as needed. 100 tablet 2   alendronate  (FOSAMAX ) 70 MG tablet TAKE  1 TABLET BY MOUTH WEEKLY  TAKE WITH A FULL GLASS OF WATER   ON AN EMPTY STOMACH 12 tablet 3   busPIRone  (BUSPAR ) 10 MG tablet TAKE 1 TABLET(10 MG) BY MOUTH TWICE DAILY 60 tablet 2   cefadroxil  (DURICEF) 500 MG capsule Take 1 capsule (500 mg total) by mouth 2 (two) times daily. 60 capsule 2   clonazePAM  (KLONOPIN ) 1 MG tablet Take 0.5-1 tablets (0.5-1 mg total) by mouth 2 (two) times daily as needed for anxiety. 60 tablet 0   COMBIVENT  RESPIMAT 20-100 MCG/ACT AERS respimat 1 puff every 6 (six) hours. (Patient taking differently: Inhale 1 puff into the lungs every 6 (six) hours. As needed)     diclofenac  Sodium (VOLTAREN ) 1 % GEL Apply 4 g topically 4 (four) times daily. (Patient taking differently: Apply 4 g topically 4 (four) times daily as needed (pain).) 350 g 1   DULoxetine  (CYMBALTA ) 60 MG capsule Take 1 capsule (60 mg total) by mouth at bedtime. 90 capsule 1   Dupilumab  (DUPIXENT ) 300 MG/2ML SOAJ Inject 300 mg into the skin every 14 (fourteen) days. **loading dose completed in office on 07/02/23** 12 mL 1   ELIQUIS  5 MG TABS tablet TAKE 1 TABLET(5 MG) BY MOUTH TWICE DAILY 60 tablet 1   fluticasone -salmeterol (WIXELA INHUB) 250-50 MCG/ACT AEPB Inhale 1 puff into the lungs in the morning and at bedtime. 60 each 12   gabapentin  (NEURONTIN ) 300 MG capsule Take 1 capsule (300 mg total) by mouth 2 (two) times daily. 60 capsule 5    HYDROcodone -acetaminophen  (NORCO/VICODIN) 5-325 MG tablet Take 1 tablet by mouth 2 (two) times daily as needed for severe pain (pain score 7-10). 45 tablet 0   ipratropium-albuterol  (DUONEB) 0.5-2.5 (3) MG/3ML SOLN Take 3 mLs by nebulization every 6 (six) hours as needed.     lidocaine  (LIDODERM ) 5 % Place 1 patch onto the skin daily. To affected area of the neck. Remove & Discard patch within 12 hours or as directed by MD 30 patch 4   methocarbamol  (ROBAXIN ) 750 MG tablet Take 1 tablet (750 mg total) by mouth every 8 (eight) hours as needed for muscle spasms. 90 tablet 5   montelukast  (SINGULAIR ) 10 MG tablet TAKE 1 TABLET(10 MG) BY MOUTH AT BEDTIME 90 tablet 1   ondansetron  (ZOFRAN ) 4 MG tablet TAKE 1 TABLET BY MOUTH TWICE DAILY AS NEEDED 45 tablet 1   oxybutynin  (DITROPAN -XL) 10 MG 24 hr tablet TAKE 1 TABLET(10 MG) BY MOUTH AT BEDTIME 30 tablet 5   pantoprazole  (PROTONIX ) 40 MG tablet Take 1 tablet (40 mg total) by mouth 2 (two) times daily. 180 tablet 1   polyethylene glycol (MIRALAX  / GLYCOLAX ) 17 g packet Take 17 g by mouth 2 (two) times daily.     rOPINIRole  (REQUIP ) 1 MG tablet Take 1 tablet (1 mg total) by mouth at bedtime. 90 tablet 1   SUMAtriptan  (IMITREX ) 100 MG tablet Take 1 tablet (100 mg total) by mouth every 2 (two) hours as needed (for migraine headaches.). May repeat in 1 hours if headache persists or recurs. 10 tablet 3   traZODone  (DESYREL ) 150 MG tablet TAKE 1 TABLET(150 MG) BY MOUTH AT BEDTIME 30 tablet 2   methylPREDNISolone  (MEDROL  DOSEPAK) 4 MG TBPK tablet Take as Directed, per package instructions (Patient not taking: Reported on 10/04/2023) 1 each 0   No facility-administered medications prior to visit.

## 2023-10-06 LAB — MICROALBUMIN / CREATININE URINE RATIO: Microalb Creat Ratio: 42

## 2023-10-07 ENCOUNTER — Encounter: Attending: Neurosurgery | Admitting: Dietician

## 2023-10-07 ENCOUNTER — Other Ambulatory Visit: Payer: Self-pay | Admitting: Pharmacy Technician

## 2023-10-07 ENCOUNTER — Other Ambulatory Visit: Payer: Self-pay

## 2023-10-07 ENCOUNTER — Ambulatory Visit: Payer: 59 | Admitting: Nurse Practitioner

## 2023-10-07 ENCOUNTER — Encounter: Payer: Self-pay | Admitting: Dietician

## 2023-10-07 VITALS — Ht 63.0 in | Wt 120.1 lb

## 2023-10-07 DIAGNOSIS — M4802 Spinal stenosis, cervical region: Secondary | ICD-10-CM | POA: Insufficient documentation

## 2023-10-07 DIAGNOSIS — E611 Iron deficiency: Secondary | ICD-10-CM | POA: Diagnosis not present

## 2023-10-07 DIAGNOSIS — E119 Type 2 diabetes mellitus without complications: Secondary | ICD-10-CM | POA: Diagnosis not present

## 2023-10-07 DIAGNOSIS — R634 Abnormal weight loss: Secondary | ICD-10-CM | POA: Insufficient documentation

## 2023-10-07 DIAGNOSIS — Z713 Dietary counseling and surveillance: Secondary | ICD-10-CM | POA: Insufficient documentation

## 2023-10-07 DIAGNOSIS — Z6821 Body mass index (BMI) 21.0-21.9, adult: Secondary | ICD-10-CM | POA: Diagnosis not present

## 2023-10-07 DIAGNOSIS — Z981 Arthrodesis status: Secondary | ICD-10-CM | POA: Diagnosis present

## 2023-10-07 DIAGNOSIS — G992 Myelopathy in diseases classified elsewhere: Secondary | ICD-10-CM | POA: Diagnosis present

## 2023-10-07 NOTE — Progress Notes (Signed)
 Medical Nutrition Therapy  Appointment Start time:  1430  Appointment End time:  1535  Primary concerns today: Weight Loss, Surgical site healing  Referral diagnosis: M48.02, G99.2 - Stenosis of cervical spine with myelopathy, Z98.1 - S/P cervical spinal fusion, E61.1 - Iron  deficiency Preferred learning style: No preference indicated Learning readiness: Contemplating   NUTRITION ASSESSMENT   Anthropometrics: Ht: 63 Wt: 120.1 lbs BMI: 21.27 kg/m2 Wt Hx: Loss of 19% in 3 months, has regained ~10 lbs since   Clinical Medical Hx: HTN, COPD, T2DM, Osteoporosis, Acid reflux, IDA Medications: Reviewed Labs: Reviewed Notable Signs/Symptoms: Pronounced clavicles, loose skin on arms, loose fitting dentures.  Lifestyle & Dietary Hx Pt reports hx of cervical spinal fusion in 02/2023 and subsequent infection/sepsis that resulted in hospitalization and weight loss of ~25 lbs in 3 months. Pt reports trying to gain weight since surgery, states they have been dealing with lose fitting dentures (due to weight loss), an oral yeast infection and nausea that have made it difficult to eat enough to continue weight gain. Pt reports low appetite, has been picking at their food since their sepsis. Pt reports history of bulimia in there late teens and early 47s and residual fear of weight gain. Pt reports drinking a Boost daily in the morning to try to increase protein/calories.   Estimated daily fluid intake: >48 oz Supplements: MVI, Had calcium Sleep: Pain interferes with falling asleep Stress / self-care: Mild, stresses over weight Current average weekly physical activity: ADLs   24-Hr Dietary Recall First Meal: Boost Snack:  Second Meal: [employer] fried chicken breast, potatoes, snack wrap Snack:  Third Meal:  Snack:  Beverages: ICE sparkling water , Gatorade    NUTRITION DIAGNOSIS  [place]-3.2 Unintentional weight loss As related to sepsis.  As evidenced by weight loss of 19% in 3 months,  hospitalization for post surgical sepsis.   NUTRITION INTERVENTION  Nutrition education (E-1) on the following topics:  Educated patient on the role of increasing calories and protein on building muscle mass and healthy weight gain. Worked with patient to identify sources of protein and healthy fats, as well as how to increase calories when preparing food. Educated patient to eat 5-6 times a day to maximize protein intake. Encourage patient to incorporate physical activity, especially resistance training, as much as possible.   Handouts Provided Include  High Calore High Protein Nutrition Therapy  Learning Style & Readiness for Change Teaching method utilized: Visual & Auditory  Demonstrated degree of understanding via: Teach Back  Barriers to learning/adherence to lifestyle change: Hx of eating disorder/fear of weight gain  Goals Established by Pt Start your day with a Jenel Protein oatmeal for breakfast and use whole milk instead of water . Add an unflavored WHEY protein powder to your ice cream at night! Try adding a scoop to your ICE water  that you drink during the day. Have a big spoonful of peanut butter as a snack whenever you like! Buy a large jar and keep it out on your counter as a constant reminder to have some! Begin taking your Calcium and Vitamin D  supplement daily. Try to eat something every 3 hours that contains protein. Look for changes in energy level and strength as indicators of improvement in diet.   MONITORING & EVALUATION Dietary intake, weekly physical activity, and weight change in 6 weeks.  Next Steps  Patient is to increase protein intake, follow up with RD.

## 2023-10-07 NOTE — Patient Instructions (Addendum)
 Start your day with a Jenel Protein oatmeal for breakfast and use whole milk instead of water .   Move your Boost to the evening within an hour or two before bed  Add an unflavored WHEY protein powder to your ice cream at night! Try adding a scoop to your ICE water  that you drink during the day.  Have a big spoonful of peanut butter as a snack whenever you like! Buy a large jar and keep it out on your counter as a constant reminder to have some!  Begin taking your Calcium and Vitamin D  supplement daily.  Try to eat something every 3 hours that contains protein.  Look for changes in energy level and strength as indicators of improvement in diet

## 2023-10-07 NOTE — Progress Notes (Signed)
 Specialty Pharmacy Refill Coordination Note  Summer Murphy is a 68 y.o. female contacted today regarding refills of specialty medication(s) Dupilumab  (Dupixent )   Patient requested Delivery   Delivery date: 10/14/23   Verified address: [address] [facility]   Medication will be filled on 10/13/23.

## 2023-10-08 ENCOUNTER — Encounter: Payer: Self-pay | Admitting: Nurse Practitioner

## 2023-10-12 ENCOUNTER — Ambulatory Visit: Attending: Infectious Diseases | Admitting: Infectious Diseases

## 2023-10-12 ENCOUNTER — Encounter: Payer: Self-pay | Admitting: Infectious Diseases

## 2023-10-12 ENCOUNTER — Other Ambulatory Visit
Admission: RE | Admit: 2023-10-12 | Discharge: 2023-10-12 | Disposition: A | Discharge status: Home / Self Care | Attending: Infectious Diseases | Admitting: Infectious Diseases

## 2023-10-12 ENCOUNTER — Other Ambulatory Visit
Admission: RE | Admit: 2023-10-12 | Discharge: 2023-10-12 | Disposition: A | Discharge status: Home / Self Care | Attending: Physician Assistant | Admitting: Physician Assistant

## 2023-10-12 VITALS — BP 139/69 | HR 85 | Temp 97.2°F | Ht 59.5 in | Wt 117.0 lb

## 2023-10-12 DIAGNOSIS — I10 Essential (primary) hypertension: Secondary | ICD-10-CM | POA: Insufficient documentation

## 2023-10-12 DIAGNOSIS — K219 Gastro-esophageal reflux disease without esophagitis: Secondary | ICD-10-CM | POA: Diagnosis not present

## 2023-10-12 DIAGNOSIS — E079 Disorder of thyroid, unspecified: Secondary | ICD-10-CM | POA: Insufficient documentation

## 2023-10-12 DIAGNOSIS — F419 Anxiety disorder, unspecified: Secondary | ICD-10-CM | POA: Diagnosis not present

## 2023-10-12 DIAGNOSIS — Z5986 Financial insecurity: Secondary | ICD-10-CM | POA: Insufficient documentation

## 2023-10-12 DIAGNOSIS — M810 Age-related osteoporosis without current pathological fracture: Secondary | ICD-10-CM | POA: Insufficient documentation

## 2023-10-12 DIAGNOSIS — M25512 Pain in left shoulder: Secondary | ICD-10-CM | POA: Diagnosis not present

## 2023-10-12 DIAGNOSIS — Y838 Other surgical procedures as the cause of abnormal reaction of the patient, or of later complication, without mention of misadventure at the time of the procedure: Secondary | ICD-10-CM | POA: Insufficient documentation

## 2023-10-12 DIAGNOSIS — Z5941 Food insecurity: Secondary | ICD-10-CM | POA: Insufficient documentation

## 2023-10-12 DIAGNOSIS — R1310 Dysphagia, unspecified: Secondary | ICD-10-CM | POA: Diagnosis not present

## 2023-10-12 DIAGNOSIS — R7881 Bacteremia: Secondary | ICD-10-CM | POA: Insufficient documentation

## 2023-10-12 DIAGNOSIS — B9561 Methicillin susceptible Staphylococcus aureus infection as the cause of diseases classified elsewhere: Secondary | ICD-10-CM | POA: Insufficient documentation

## 2023-10-12 DIAGNOSIS — G062 Extradural and subdural abscess, unspecified: Secondary | ICD-10-CM | POA: Diagnosis not present

## 2023-10-12 DIAGNOSIS — Z79899 Other long term (current) drug therapy: Secondary | ICD-10-CM | POA: Insufficient documentation

## 2023-10-12 DIAGNOSIS — Y758 Miscellaneous neurological devices associated with adverse incidents, not elsewhere classified: Secondary | ICD-10-CM | POA: Diagnosis not present

## 2023-10-12 DIAGNOSIS — J4489 Other specified chronic obstructive pulmonary disease: Secondary | ICD-10-CM | POA: Diagnosis not present

## 2023-10-12 DIAGNOSIS — Z981 Arthrodesis status: Secondary | ICD-10-CM | POA: Diagnosis not present

## 2023-10-12 DIAGNOSIS — M25511 Pain in right shoulder: Secondary | ICD-10-CM | POA: Diagnosis not present

## 2023-10-12 DIAGNOSIS — T8149XA Infection following a procedure, other surgical site, initial encounter: Secondary | ICD-10-CM | POA: Insufficient documentation

## 2023-10-12 DIAGNOSIS — M542 Cervicalgia: Secondary | ICD-10-CM | POA: Insufficient documentation

## 2023-10-12 DIAGNOSIS — K449 Diaphragmatic hernia without obstruction or gangrene: Secondary | ICD-10-CM | POA: Insufficient documentation

## 2023-10-12 LAB — CBC WITH DIFFERENTIAL/PLATELET
Abs Immature Granulocytes: 0.04 K/uL (ref 0.00–0.07)
Basophils Absolute: 0.1 K/uL (ref 0.0–0.1)
Basophils Relative: 1 %
Eosinophils Absolute: 0.1 K/uL (ref 0.0–0.5)
Eosinophils Relative: 2 %
HCT: 33.1 % — ABNORMAL LOW (ref 36.0–46.0)
Hemoglobin: 10.7 g/dL — ABNORMAL LOW (ref 12.0–15.0)
Immature Granulocytes: 1 %
Lymphocytes Relative: 17 %
Lymphs Abs: 1.4 K/uL (ref 0.7–4.0)
MCH: 28.1 pg (ref 26.0–34.0)
MCHC: 32.3 g/dL (ref 30.0–36.0)
MCV: 86.9 fL (ref 80.0–100.0)
Monocytes Absolute: 0.4 K/uL (ref 0.1–1.0)
Monocytes Relative: 5 %
Neutro Abs: 6.1 K/uL (ref 1.7–7.7)
Neutrophils Relative %: 74 %
Platelets: 430 K/uL — ABNORMAL HIGH (ref 150–400)
RBC: 3.81 MIL/uL — ABNORMAL LOW (ref 3.87–5.11)
RDW: 14 % (ref 11.5–15.5)
WBC: 8.1 K/uL (ref 4.0–10.5)
nRBC: 0 % (ref 0.0–0.2)

## 2023-10-12 LAB — COMPREHENSIVE METABOLIC PANEL WITH GFR
ALT: 14 U/L (ref 0–44)
AST: 20 U/L (ref 15–41)
Albumin: 3.4 g/dL — ABNORMAL LOW (ref 3.5–5.0)
Alkaline Phosphatase: 90 U/L (ref 38–126)
Anion gap: 13 (ref 5–15)
BUN: 11 mg/dL (ref 8–23)
CO2: 28 mmol/L (ref 22–32)
Calcium: 9.2 mg/dL (ref 8.9–10.3)
Chloride: 99 mmol/L (ref 98–111)
Creatinine, Ser: 0.43 mg/dL — ABNORMAL LOW (ref 0.44–1.00)
GFR, Estimated: >60 mL/min (ref >60)
Glucose, Bld: 107 mg/dL — ABNORMAL HIGH (ref 70–99)
Potassium: 3.3 mmol/L — ABNORMAL LOW (ref 3.5–5.1)
Sodium: 140 mmol/L (ref 135–145)
Total Bilirubin: 0.3 mg/dL (ref 0.0–1.2)
Total Protein: 7 g/dL (ref 6.5–8.1)

## 2023-10-12 LAB — VITAMIN D 25 HYDROXY (VIT D DEFICIENCY, FRACTURES): Vit D, 25-Hydroxy: 34.34 ng/mL (ref 30–100)

## 2023-10-12 LAB — C-REACTIVE PROTEIN: CRP: 3.2 mg/dL — ABNORMAL HIGH (ref <1.0)

## 2023-10-12 LAB — SEDIMENTATION RATE: Sed Rate: 63 mm/h — ABNORMAL HIGH (ref 0–30)

## 2023-10-12 LAB — TSH: TSH: 0.974 u[IU]/mL (ref 0.350–4.500)

## 2023-10-12 MED ORDER — CEFADROXIL 500 MG PO CAPS: 500.0000 mg | ORAL_CAPSULE | Freq: Two times a day (BID) | ORAL | 3 refills | Status: DC

## 2023-10-12 NOTE — Progress Notes (Signed)
 NAME: Summer Murphy  DOB: 1956/02/12  MRN: [id number]  Date/Time: 10/12/2023 9:34 AM   Subjective:  Follow up visit for MSSA infection of cervical spine ( at the fusion site) and lumbar spine Now on cefadroxil  500mg  BID   Summer Murphy is a 68 y.o female. with a history of cervical myelpathy for which she  underwent c3-c6 laminectomy and posterior fusion on 03/02/23, Asthma, COPD, barrettts esophagus, hiatus hernia, HTN,  B/l TKA was in [facility] between 04/16/23-04/28/23 for MSSA bacteremia MRI of the cervical spine  showed collection both deep at the laminectomy bed and superficial at the site of surgical incision site Blood culture came back positive for MSSA and she was started on cefazolin  Pt was seen by neurosurgeon  and taken to the OR  on 04/17/23 and underwent irrigation and debridement.,  Culture was MSSA. Pt was also c/o b/l groin pain and leg pain and had MRI of the lumbar spine on 04/25/23 and it showed discitis-osteomyelitis at L3-L4 and L4-L5. Small amount of enhancing fluid in the anterior epidural space between the L3 and L4 levels with suspected epidural abscess in the left lateral recess at the level of the L4 vertebral body. 2. Bilateral psoas muscle abscesses. IR aspiration of that fluid was no growth  She could not tolerate Po rifampin  because of severe upper GI symptoms She was sent to SNF on 2/4 with 6 weeks of cefazolin  to finish on 06/07/23 But she was discharged from SNF on 05/18/23  because of insurance. She received weekly Dalbavancin X 3 doses at day surgery on 2/25, 3/4 and 3/11 She saw me on 3/18 and I started her on Po cefadroxil  1 gram BID for 8 weeks and then she went on cefadroxil  500mg  Po BID She continues to have pain in her shoulders/neck and neurosurgery did CT in may 2025  and that showed loosening screws bilateral C3 and C6 laminar screws, and also the right C4 screw.  3. No postoperative arthrodesis identified, except for possible developing left C4-C5  facet fusion. Underlying degenerative appearing facet ankylosis on the left at C2-C3. They were concerned about osteoporosis and her ongoing weight loss She was referred to [facility] and PA saw her on 6/30 and ordered Dexa scan which the patient has not gotten. She is unable to take fosomax because of GERD She is followed by pulmonary and saw them on 7/14  for asthma and is on Dupilumab   Past Medical History:  Diagnosis Date   Acid reflux    Anemia    Anxiety    a.) buspirone  BID + BZO PRN (clonazepam )   Arthritis    Asthma    Cervical disc disorder with radiculopathy of cervical region    Chronic pain syndrome    a.) on COT as prescribed by pain mangement   Chronic, continuous use of opioids    a.) followed by pain mangement   COPD (chronic obstructive pulmonary disease) (HCC)    DOE (dyspnea on exertion)    Hematuria    Hiatal hernia    History of kidney stones    Hypertension    Hypoglycemia    Insomnia    a.) uses Trazodone  PRN   LBBB (left bundle branch block)    Migraine    Murmur    Osteopenia    Palpitations    Pre-diabetes    Restless leg    a.) on ropinirole    Stenosis of cervical spine with myelopathy (HCC)    T2DM (type 2 diabetes  mellitus) [facility])     Past Surgical History:  Procedure Laterality Date   ABDOMINAL HYSTERECTOMY     AMPUTATION TOE Left 07/31/2016   Procedure: AMPUTATION TOE/MPJ 2nd toe;  Surgeon: Neill Boas, DPM;  Location: [facility] ORS;  Service: Podiatry;  Laterality: Left;   APPENDECTOMY  1990   BACK SURGERY     low back   BREAST SURGERY     bilateral breast reduction   CARDIAC ELECTROPHYSIOLOGY STUDY AND ABLATION     CERVICAL WOUND DEBRIDEMENT N/A 04/17/2023   Procedure: CERVICAL WOUND DEBRIDEMENT;  Surgeon: Bluford Standing, MD;  Location: [facility] ORS;  Service: Neurosurgery;  Laterality: N/A;   CHOLECYSTECTOMY  1990   COLONOSCOPY WITH PROPOFOL  N/A 01/04/2017   Procedure: COLONOSCOPY WITH PROPOFOL ;  Surgeon: Viktoria Lamar DASEN, MD;  Location[facility]  ENDOSCOPY;  Service: Endoscopy;  Laterality: N/A;   CORNEAL TRANSPLANT     ESOPHAGOGASTRODUODENOSCOPY N/A 04/09/2021   Procedure: ESOPHAGOGASTRODUODENOSCOPY (EGD);  Surgeon: Unk Corinn Skiff, MD;  Location: [facility] ENDOSCOPY;  Service: Gastroenterology;  Laterality: N/A;   ESOPHAGOGASTRODUODENOSCOPY (EGD) WITH PROPOFOL  N/A 01/04/2017   Procedure: ESOPHAGOGASTRODUODENOSCOPY (EGD) WITH PROPOFOL ;  Surgeon: Viktoria Lamar DASEN, MD;  Location: [facility] ENDOSCOPY;  Service: Endoscopy;  Laterality: N/A;   EXCISION BONE CYST Left 07/31/2016   Procedure: EXCISION BONE CYST/exostectomy 28124/left 2nd;  Surgeon: Neill Boas, DPM;  Location: [facility] ORS;  Service: Podiatry;  Laterality: Left;   EXTRACORPOREAL SHOCK WAVE LITHOTRIPSY Left 09/12/2015   Procedure: EXTRACORPOREAL SHOCK WAVE LITHOTRIPSY (ESWL);  Surgeon: Rosina Riis, MD;  Location: [facility] ORS;  Service: Urology;  Laterality: Left;   FRACTURE SURGERY     left foot   GUM SURGERY Left    gum infection   HARDWARE REMOVAL Left 11/14/2020   Procedure: HARDWARE REMOVAL;  Surgeon: Kathlynn Sharper, MD;  Location: [facility] ORS;  Service: Orthopedics;  Laterality: Left;   HH repair     Fundoplication   JOINT REPLACEMENT Bilateral 2013,2014   total knees   LAPAROSCOPIC HYSTERECTOMY     LITHOTRIPSY     periprosthetic supracondylar fracture of left femur  02/16/2020   [facility]   POSTERIOR CERVICAL FUSION/FORAMINOTOMY N/A 03/02/2023   Procedure: C3-6 POSTERIOR CERVICAL LAMINECTOMY AND FUSION;  Surgeon: Claudene Riis ORN, MD;  Location: [facility] ORS;  Service: Neurosurgery;  Laterality: N/A;   TONSILLECTOMY     TOTAL KNEE REVISION Right 11/14/2019   Procedure: Revision patella and tibial polyethylene;  Surgeon: Kathlynn Sharper, MD;  Location: [facility] ORS;  Service: Orthopedics;  Laterality: Right;   URETEROSCOPY      Social History   Socioeconomic History   Marital status: Widowed    Spouse name: Not on file   Number of children: Not on file   Years of education:  Not on file   Highest education level: Not on file  Occupational History   Not on file  Tobacco Use   Smoking status: Never    Passive exposure: Past   Smokeless tobacco: Never  Vaping Use   Vaping status: Never Used  Substance and Sexual Activity   Alcohol use: No   Drug use: No   Sexual activity: Not Currently  Other Topics Concern   Not on file  Social History Narrative   Lives at home alone. Relatives are the support person.    Social Drivers of Health   Financial Resource Strain: Medium Risk (05/27/2023)   Received from [facility]   Overall Financial Resource Strain (CARDIA)    Difficulty of Paying Living Expenses: Somewhat hard  Food Insecurity: Food Insecurity Present (05/27/2023)   Received from [facility]   Hunger Vital Sign    Within the past 12 months, you worried that your food would run out before you got the money to buy more.: Never true    Within the past 12 months, the food you bought just didn't last and you didn't have money to get more.: Sometimes true  Transportation Needs: No Transportation Needs (05/27/2023)   Received from [facility] - Transportation    In the past 12 months, has lack of transportation kept you from medical appointments or from getting medications?: No    Lack of Transportation (Non-Medical): No  Physical Activity: Not on file  Stress: Not on file  Social Connections: Moderately Isolated (04/16/2023)   Social Connection and Isolation Panel    Frequency of Communication with Friends and Family: More than three times a week    Frequency of Social Gatherings with Friends and Family: Once a week    Attends Religious Services: More than 4 times per year    Active Member of [employer] or Organizations: No    Attends Banker Meetings: Never    Marital Status: Widowed  Intimate Partner Violence: Not At Risk (04/16/2023)   Humiliation, Afraid, Rape, and Kick questionnaire     Fear of Current or Ex-Partner: No    Emotionally Abused: No    Physically Abused: No    Sexually Abused: No    Family History  Problem Relation Age of Onset   Hypertension Mother    Stroke Mother    Prostate cancer Father    Kidney Stones Father    Diabetes Brother    Hypertension Brother    Breast cancer Maternal Aunt    Kidney disease Neg Hx    Allergies  Allergen Reactions   Librium [Chlordiazepoxide] Shortness Of Breath   Reglan [Metoclopramide] Hives and Other (See Comments)    hallucinations    Vanilla Shortness Of Breath    Pt reports allergy to vanilla extract only   Aspirin Hives   Nsaids Rash    Rash/flares asthma issues.   Azithromycin  Other (See Comments)    Unsure of what the reaction was.   Buprenorphine Hcl Other (See Comments)    Unsure of what the reaction was.    Flexeril  [Cyclobenzaprine ]     hallucinations   Morphine Other (See Comments)    Unsure of reaction   Sulfa Antibiotics     Other reaction(s): Unknown   Tolmetin     Other Reaction: Allergy   Amlodipine  Besylate Itching and Rash    arms, stomach and forehead   Iron  Nausea And Vomiting   I? Current Outpatient Medications  Medication Sig Dispense Refill   acetaminophen  (TYLENOL ) 500 MG tablet Take 2 tablets (1,000 mg total) by mouth every 6 (six) hours as needed. 100 tablet 2   alendronate  (FOSAMAX ) 70 MG tablet TAKE 1 TABLET BY MOUTH WEEKLY  TAKE WITH A FULL GLASS OF WATER   ON AN EMPTY STOMACH 12 tablet 3   busPIRone  (BUSPAR ) 10 MG tablet TAKE 1 TABLET(10 MG) BY MOUTH TWICE DAILY 60 tablet 2   cefadroxil  (DURICEF) 500 MG capsule Take 1 capsule (500 mg total) by mouth 2 (two) times daily. 60 capsule 2   clonazePAM  (KLONOPIN ) 1 MG tablet Take 0.5-1 tablets (0.5-1 mg total) by mouth 2 (two) times daily as needed for anxiety. 60 tablet 0   COMBIVENT  RESPIMAT 20-100 MCG/ACT AERS  respimat 1 puff every 6 (six) hours. (Patient taking differently: Inhale 1 puff into the lungs every 6 (six)  hours. As needed)     diclofenac  Sodium (VOLTAREN ) 1 % GEL Apply 4 g topically 4 (four) times daily. (Patient taking differently: Apply 4 g topically 4 (four) times daily as needed (pain).) 350 g 1   DULoxetine  (CYMBALTA ) 60 MG capsule Take 1 capsule (60 mg total) by mouth at bedtime. 90 capsule 1   Dupilumab  (DUPIXENT ) 300 MG/2ML SOAJ Inject 300 mg into the skin every 14 (fourteen) days. **loading dose completed in office on 07/02/23** 12 mL 1   ELIQUIS  5 MG TABS tablet TAKE 1 TABLET(5 MG) BY MOUTH TWICE DAILY 60 tablet 1   fluticasone -salmeterol (WIXELA INHUB) 250-50 MCG/ACT AEPB Inhale 1 puff into the lungs in the morning and at bedtime. 60 each 12   gabapentin  (NEURONTIN ) 300 MG capsule Take 1 capsule (300 mg total) by mouth 2 (two) times daily. 60 capsule 5   HYDROcodone -acetaminophen  (NORCO/VICODIN) 5-325 MG tablet Take 1 tablet by mouth 2 (two) times daily as needed for severe pain (pain score 7-10). 45 tablet 0   ipratropium-albuterol  (DUONEB) 0.5-2.5 (3) MG/3ML SOLN Take 3 mLs by nebulization every 6 (six) hours as needed.     lidocaine  (LIDODERM ) 5 % Place 1 patch onto the skin daily. To affected area of the neck. Remove & Discard patch within 12 hours or as directed by MD 30 patch 4   methocarbamol  (ROBAXIN ) 750 MG tablet Take 1 tablet (750 mg total) by mouth every 8 (eight) hours as needed for muscle spasms. 90 tablet 5   methylPREDNISolone  (MEDROL  DOSEPAK) 4 MG TBPK tablet Take as Directed, per package instructions 1 each 0   montelukast  (SINGULAIR ) 10 MG tablet TAKE 1 TABLET(10 MG) BY MOUTH AT BEDTIME 90 tablet 1   ondansetron  (ZOFRAN ) 4 MG tablet TAKE 1 TABLET BY MOUTH TWICE DAILY AS NEEDED 45 tablet 1   oxybutynin  (DITROPAN -XL) 10 MG 24 hr tablet TAKE 1 TABLET(10 MG) BY MOUTH AT BEDTIME 30 tablet 5   pantoprazole  (PROTONIX ) 40 MG tablet Take 1 tablet (40 mg total) by mouth 2 (two) times daily. 180 tablet 1   polyethylene glycol (MIRALAX  / GLYCOLAX ) 17 g packet Take 17 g by mouth 2  (two) times daily. (Patient taking differently: Take 17 g by mouth daily as needed.)     rOPINIRole  (REQUIP ) 1 MG tablet Take 1 tablet (1 mg total) by mouth at bedtime. 90 tablet 1   SUMAtriptan  (IMITREX ) 100 MG tablet Take 1 tablet (100 mg total) by mouth every 2 (two) hours as needed (for migraine headaches.). May repeat in 1 hours if headache persists or recurs. 10 tablet 3   traZODone  (DESYREL ) 150 MG tablet TAKE 1 TABLET(150 MG) BY MOUTH AT BEDTIME 30 tablet 2   No current facility-administered medications for this visit.     Abtx:  Anti-infectives (From admission, onward)    None       REVIEW OF SYSTEMS:  Const: negative fever, negative chills,  weight loss Eyes: negative diplopia or visual changes, negative eye pain ENT: negative coryza, negative sore throat Resp: negative cough, hemoptysis, dyspnea Cards: negative for chest pain, palpitations, lower extremity edema GU: negative for frequency, dysuria and hematuria GI: Negative for abdominal pain, diarrhea, bleeding, constipation Skin: negative for rash and pruritus Heme: negative for easy bruising and gum/nose bleeding FD:wzrx pain Neurolo:neck , shoulder pain  Psych:  anxiety, depression  Endocrine: negative for thyroid , diabetes Allergy/Immunology- as  above Objective:  VITALS:  BP 139/69   Pulse 85   Temp (!) 97.2 F (36.2 C) (Temporal)   Ht 4' 11.5 (1.511 m)   Wt 117 lb (53.1 kg)   SpO2 96%   BMI 23.24 kg/m   PHYSICAL EXAM:  General: Alert, cooperative, no distress, appears stated age.  Head: Normocephalic, without obvious abnormality, atraumatic. Eyes: Conjunctivae clear, anicteric sclerae. Pupils are equal ENT Nares normal. No drainage or sinus tenderness. Lips, mucosa, and tongue normal. No Thrush Neck:surgical scar healed well Back: No CVA tenderness. Lungs: Clear to auscultation bilaterally. No Wheezing or Rhonchi. No rales. Heart: Regular rate and rhythm, no murmur, rub or gallop. Abdomen: Soft,  non-tender,not distended. Bowel sounds normal. No masses Extremities: atraumatic, no cyanosis. No edema. No clubbing Skin: No rashes or lesions. Or bruising Lymph: Cervical, supraclavicular normal. Neurologic: Grossly non-focal Pertinent Labs Lab Results CBC    Component Value Date/Time   WBC 8.4 07/30/2023 0956   WBC 5.7 07/12/2023 1007   RBC 4.53 07/30/2023 0956   RBC 4.57 07/30/2023 0956   HGB 12.6 07/30/2023 0956   HGB 13.4 08/29/2021 1303   HCT 39.4 07/30/2023 0956   HCT 39.4 08/29/2021 1303   PLT 255 07/30/2023 0956   PLT 186 08/29/2021 1303   MCV 87.0 07/30/2023 0956   MCV 91 08/29/2021 1303   MCV 84 06/28/2014 1710   MCH 27.8 07/30/2023 0956   MCHC 32.0 07/30/2023 0956   RDW 16.1 (H) 07/30/2023 0956   RDW 12.6 08/29/2021 1303   RDW 16.7 (H) 06/28/2014 1710   LYMPHSABS 1.2 07/30/2023 0956   LYMPHSABS 2.0 08/29/2021 1303   LYMPHSABS 0.6 (L) 06/13/2013 0505   MONOABS 0.5 07/30/2023 0956   MONOABS 0.1 (L) 06/13/2013 0505   EOSABS 0.3 07/30/2023 0956   EOSABS 0.2 08/29/2021 1303   EOSABS 0.0 06/13/2013 0505   BASOSABS 0.1 07/30/2023 0956   BASOSABS 0.0 08/29/2021 1303   BASOSABS 0.0 06/13/2013 0505       Latest Ref Rng & Units 07/12/2023   10:14 AM 06/18/2023   11:57 AM 06/17/2023    9:39 AM  CMP  Glucose 70 - 99 mg/dL  883    BUN 8 - 23 mg/dL  15    Creatinine 9.55 - 1.00 mg/dL  9.49    Sodium 864 - 854 mmol/L  138    Potassium 3.5 - 5.1 mmol/L 4.0  4.6  4.2   Chloride 98 - 111 mmol/L  100    CO2 22 - 32 mmol/L  28    Calcium 8.9 - 10.3 mg/dL  9.5    Total Protein 6.5 - 8.1 g/dL  6.9    Total Bilirubin 0.0 - 1.2 mg/dL  0.4    Alkaline Phos 38 - 126 U/L  78    AST 15 - 41 U/L  20    ALT 0 - 44 U/L  13      ? Impression/Recommendation  c3-c6 laminectomy and posterior fusion on 03/02/23? Complicated by MSSA bacteremia  due to cervical spine surgical site infection MRI showed superficial and deep abscess- underwent I/D on 04/17/23 and culture was  MSSA Repeat blood culture from 1/26  and 2/2 were neg Pt was to have  IV cefazoin for total of 6 weeks until 3/17, but on 2/25 she was discharged from SNF due to insurance- So she received Iv dalbavance weekly dose ( 2/25, 1500mg , 3/4 1 grm, 3./11 - 1gm. Because of hardware added rifampin  300mg  Po BID but  she was not able to to tolerate the rifampin  as she had severe nausea and poor appetite.  Hence it was discontinued.  On 06/08/23 she was  started on  PO cefadroxil  1 gram Q 12 -with a plan to give for 8 weeks because of hardware  Now she is on  cefadroxil  500mg  Po BID findefinitely likely because of loosened screws in recent CT Today will check ESR/CRP     L4 and L4-L5 discitis and osteomyelitis -treated  underwent aspiration of the collection on 04/25/23  but culture neg- Lumbar  pain is not an issue anymore     Anemia much improved- last one 11.9   Hiatus hernia h/o fundoplication Dysphagia has had esophageal dilatation Nov 2024 Biopsy no evidence of metaplasia ? Anxiety  on meds ? Hypokalemia- resolved  Asthma/COPD- followed by pulmonologist and  on dupilumab  I got her a dexa scan appt for 8/14   ________________________________________________  Follow up 12 weeks

## 2023-10-13 ENCOUNTER — Other Ambulatory Visit: Payer: Self-pay

## 2023-10-13 ENCOUNTER — Ambulatory Visit: Payer: Self-pay

## 2023-10-13 LAB — PARATHYROID HORMONE, INTACT (NO CA): PTH: 34 pg/mL (ref 15–65)

## 2023-10-13 MED ORDER — CEFADROXIL 500 MG PO CAPS
1000.0000 mg | ORAL_CAPSULE | Freq: Two times a day (BID) | ORAL | 1 refills | Status: DC
Start: 2023-10-13 — End: 2023-11-30

## 2023-10-13 NOTE — Addendum Note (Signed)
 Addended by: DAYNE SHERRY RAMAN on: 10/13/2023 09:51 AM   Modules accepted: Level of Service

## 2023-10-14 ENCOUNTER — Telehealth: Payer: Self-pay

## 2023-10-14 ENCOUNTER — Other Ambulatory Visit: Payer: Self-pay

## 2023-10-14 ENCOUNTER — Other Ambulatory Visit: Payer: Self-pay | Admitting: Nurse Practitioner

## 2023-10-14 DIAGNOSIS — E876 Hypokalemia: Secondary | ICD-10-CM

## 2023-10-14 NOTE — Telephone Encounter (Signed)
 Pt advised as per lauren that eat banana  and we put labs order please repeat tomorrow

## 2023-10-18 ENCOUNTER — Telehealth: Payer: Self-pay

## 2023-10-18 NOTE — Telephone Encounter (Signed)
 Patient called wanting to know what she should do regarding the pain in her lower back, legs, and neck. States This is what happened when I was in the hospital. She also reports feeling wobbly. She denies fever or chills.   She has an appointment with pain management coming up. Encouraged her to discuss her balance concerns with PCP.   She also has questions about her cefadroxil  and how much she should be taking. Relayed she should be taking 1,000 mg (2 capsules) BID. She states the pharmacy did not give her enough.   She received 60 capsules on 7/22. Rx was not increased until 7/23. Notified her that new Rx was sent in the day after she picked up 60 day supply. She will call her pharmacy to get refill.   Reena Borromeo, BSN, RN

## 2023-10-26 ENCOUNTER — Encounter: Payer: Self-pay | Admitting: Student in an Organized Health Care Education/Training Program

## 2023-10-26 ENCOUNTER — Ambulatory Visit
Attending: Student in an Organized Health Care Education/Training Program | Admitting: Student in an Organized Health Care Education/Training Program

## 2023-10-26 ENCOUNTER — Other Ambulatory Visit: Payer: Self-pay

## 2023-10-26 VITALS — BP 118/62 | HR 94 | Temp 98.4°F | Resp 16 | Ht 59.5 in | Wt 120.0 lb

## 2023-10-26 DIAGNOSIS — M4802 Spinal stenosis, cervical region: Secondary | ICD-10-CM | POA: Insufficient documentation

## 2023-10-26 DIAGNOSIS — M5481 Occipital neuralgia: Secondary | ICD-10-CM | POA: Diagnosis not present

## 2023-10-26 DIAGNOSIS — E876 Hypokalemia: Secondary | ICD-10-CM

## 2023-10-26 DIAGNOSIS — M5412 Radiculopathy, cervical region: Secondary | ICD-10-CM | POA: Diagnosis present

## 2023-10-26 NOTE — Progress Notes (Signed)
 PROVIDER NOTE: Interpretation of information contained herein should be left to medically-trained personnel. Specific patient instructions are provided elsewhere under Patient Instructions section of medical record. This document was created in part using AI and STT-dictation technology, any transcriptional errors that may result from this process are unintentional.  Patient: Summer Murphy  Service: E/M   PCP: Liana Fish, NP  DOB: 09/24/1955  DOS: 10/26/2023  Provider: Wallie Sherry, MD  MRN: [id number]  Delivery: Face-to-face  Specialty: Interventional Pain Management  Type: Established Patient  Setting: Ambulatory outpatient facility  Specialty designation: 09  Referring Prov.: Liana Fish, NP  Location: Outpatient office facility       History of present illness (HPI) Summer Murphy, a 68 y.o. year old female, is here today because of her Cervical radicular pain [M54.12]. Summer Murphy primary complain today is Neck Pain    Pain Assessment: Severity of Chronic pain is reported as a 9 /10. Location: Neck Posterior/to left shoulder down left arm to fingers, to right shoulder. Onset: More than a month ago. Quality: Numbness, Sharp. Timing: Constant. Modifying factor(s): ice. Vitals:  height is 4' 11.5 (1.511 m) and weight is 120 lb (54.4 kg). Her temperature is 98.4 F (36.9 C). Her blood pressure is 118/62 and her pulse is 94. Her respiration is 16 and oxygen  saturation is 99%.  BMI: Estimated body mass index is 23.83 kg/m as calculated from the following:   Height as of this encounter: 4' 11.5 (1.511 m).   Weight as of this encounter: 120 lb (54.4 kg).  Last encounter: 01/27/2023. Last procedure: 12/23/2022.  Reason for encounter:  History of Present Illness   Summer Murphy is a 68 year old female with osteoporosis who presents with neck pain and headaches following spine surgery.  She experiences significant discomfort due to two loose screws in her neck, with  pain radiating to her left arm. She has mild headaches at the back of her head. She underwent spine surgery on March 02, 2023, and received a cervical injection that provided relief for only a few days.  She is currently taking Eliquis , a blood thinner, and has previously stopped it for procedures. She uses Lubridone patches at a 5% concentration, which she finds ineffective, and takes Tylenol  Arthritis for pain management. She also uses a muscle relaxer. She does not take baby aspirin. She is a 'hard stick' for IV access and has tried Valium  for relaxation during procedures, which was ineffective due to movement.  She has osteoporosis and is awaiting insurance approval for a treatment shot. She has had a blood draw and is scheduled for a test related to her condition.  She experiences stiffness when around males and finds some relief by supporting her neck with a pillow.         Summer Pulling, RN  10/26/2023  2:38 PM  Sign when Signing Visit Safety precautions to be maintained throughout the outpatient stay will include: orient to surroundings, keep bed in low position, maintain call bell within reach at all times, provide assistance with transfer out of bed and ambulation.   UDS:  Summary  Date Value Ref Range Status  01/27/2023 Note  Final    Comment:    ==================================================================== Compliance Drug Analysis, Ur ==================================================================== Specimen Alert Not Detected result may be consistent with the time of last use noted for this medication. AS NEEDED. (Hydrocodone ) ==================================================================== Test  Result       Flag       Units  Drug Present and Declared for Prescription Verification   7-aminoclonazepam              468          EXPECTED   ng/mg creat    7-aminoclonazepam is an expected metabolite of clonazepam . Source of     clonazepam  is a scheduled prescription medication.    Gabapentin                      PRESENT      EXPECTED   Trazodone                       PRESENT      EXPECTED   1,3 chlorophenyl piperazine    PRESENT      EXPECTED    1,3-chlorophenyl piperazine is an expected metabolite of trazodone .    Duloxetine                      PRESENT      EXPECTED   Acetaminophen                   PRESENT      EXPECTED   Ibuprofen                       PRESENT      EXPECTED   Diltiazem                       PRESENT      EXPECTED  Drug Present not Declared for Prescription Verification   Diphenhydramine                 PRESENT      UNEXPECTED  Drug Absent but Declared for Prescription Verification   Hydrocodone                     Not Detected UNEXPECTED ng/mg creat   Methocarbamol                   Not Detected UNEXPECTED   Diclofenac                      Not Detected UNEXPECTED    Diclofenac , as indicated in the declared medication list, is not    always detected even when used as directed.    Lidocaine                       Not Detected UNEXPECTED    Lidocaine , as indicated in the declared medication list, is not    always detected even when used as directed.  ==================================================================== Test                      Result    Flag   Units      Ref Range   Creatinine              140              mg/dL      >=79 ==================================================================== Declared Medications:  The flagging and interpretation on this report are based on the  following declared medications.  Unexpected results may arise from  inaccuracies in the declared medications.   **Note: The testing scope of this panel includes these medications:   Clonazepam  (Klonopin )  Diltiazem  (  Cardizem )  Duloxetine  (Cymbalta )  Gabapentin  (Neurontin )  Hydrocodone  (Norco)  Methocarbamol  (Robaxin )  Trazodone  (Desyrel )   **Note: The testing scope of this panel does not include small  to  moderate amounts of these reported medications:   Acetaminophen  (Norco)  Diclofenac  (Voltaren )  Ibuprofen  (Advil )  Lidocaine  (Xylocaine )   **Note: The testing scope of this panel does not include the  following reported medications:   Albuterol  (Duoneb)  Albuterol  (Combivent )  Alendronate  (Fosamax )  Buspirone  (Buspar )  Epinephrine  (EpiPen )  Fluticasone  (Flonase )  Fluticasone  (Advair)  Ipratropium (Duoneb)  Ipratropium (Combivent )  Montelukast  (Singulair )  Nystatin  (Mycostatin )  Ondansetron  (Zofran )  Pantoprazole  (Protonix )  Pilocarpine  (Salagen )  Ropinirole  (Requip )  Salmeterol (Advair)  Sumatriptan  (Imitrex )  Tirzepatide  (Mounjaro ) ==================================================================== For clinical consultation, please call [phone]. ====================================================================     No results found for: CBDTHCR No results found for: D8THCCBX No results found for: D9THCCBX  ROS  Constitutional: Denies any fever or chills Gastrointestinal: No reported hemesis, hematochezia, vomiting, or acute GI distress Musculoskeletal: Denies any acute onset joint swelling, redness, loss of ROM, or weakness Neurological: No reported episodes of acute onset apraxia, aphasia, dysarthria, agnosia, amnesia, paralysis, loss of coordination, or loss of consciousness  Medication Review  DULoxetine , Dupilumab , HYDROcodone -acetaminophen , Ipratropium-Albuterol , SUMAtriptan , acetaminophen , alendronate , apixaban , busPIRone , cefadroxil , clonazePAM , diclofenac  Sodium, fluticasone -salmeterol, gabapentin , ipratropium-albuterol , lidocaine , methocarbamol , methylPREDNISolone , montelukast , ondansetron , oxybutynin , pantoprazole , polyethylene glycol, rOPINIRole , and traZODone   History Review  Allergy: Summer Murphy is allergic to librium [chlordiazepoxide], reglan [metoclopramide], vanilla, aspirin, nsaids, azithromycin , buprenorphine hcl, flexeril   [cyclobenzaprine ], morphine, sulfa antibiotics, tolmetin, amlodipine  besylate, and iron . Drug: Summer Murphy  reports no history of drug use. Alcohol:  reports no history of alcohol use. Tobacco:  reports that she has never smoked. She has been exposed to tobacco smoke. She has never used smokeless tobacco. Social: Summer Murphy  reports that she has never smoked. She has been exposed to tobacco smoke. She has never used smokeless tobacco. She reports that she does not drink alcohol and does not use drugs. Medical:  has a past medical history of Acid reflux, Anemia, Anxiety, Arthritis, Asthma, Cervical disc disorder with radiculopathy of cervical region, Chronic pain syndrome, Chronic, continuous use of opioids, COPD (chronic obstructive pulmonary disease) (HCC), DOE (dyspnea on exertion), Hematuria, Hiatal hernia, History of kidney stones, Hypertension, Hypoglycemia, Insomnia, LBBB (left bundle branch block), Migraine, Murmur, Osteopenia, Palpitations, Pre-diabetes, Restless leg, Stenosis of cervical spine with myelopathy (HCC), and T2DM (type 2 diabetes mellitus) (HCC). Surgical: Summer Murphy  has a past surgical history that includes Ureteroscopy; Laparoscopic hysterectomy; Lithotripsy; HH repair; Cardiac electrophysiology study and ablation; Appendectomy (1990); Cholecystectomy (1990); Joint replacement (Bilateral, [id number]); Extracorporeal shock wave lithotripsy (Left, 09/12/2015); Tonsillectomy; Corneal transplant; Abdominal hysterectomy; Breast surgery; Fracture surgery; Amputation toe (Left, 07/31/2016); Excision bone cyst (Left, 07/31/2016); Colonoscopy with propofol  (N/A, 01/04/2017); Esophagogastroduodenoscopy (egd) with propofol  (N/A, 01/04/2017); Back surgery; Total knee revision (Right, 11/14/2019); periprosthetic supracondylar fracture of left femur (02/16/2020); Hardware Removal (Left, 11/14/2020); Esophagogastroduodenoscopy (N/A, 04/09/2021); Gum surgery (Left); Posterior cervical  fusion/foraminotomy (N/A, 03/02/2023); and Cervical wound debridement (N/A, 04/17/2023). Family: family history includes Breast cancer in her maternal aunt; Diabetes in her brother; Hypertension in her brother and mother; Kidney Stones in her father; Prostate cancer in her father; Stroke in her mother.  Laboratory Chemistry Profile   Renal Lab Results  Component Value Date   BUN 11 10/12/2023   CREATININE 0.43 (L) 10/12/2023   BCR 17 01/15/2022   GFRAA >60 11/16/2019   GFRNONAA >60 10/12/2023    Hepatic Lab Results  Component Value  Date   AST 20 10/12/2023   ALT 14 10/12/2023   ALBUMIN 3.4 (L) 10/12/2023   ALKPHOS 90 10/12/2023   LIPASE 25 05/12/2022    Electrolytes Lab Results  Component Value Date   NA 140 10/12/2023   K 3.3 (L) 10/12/2023   CL 99 10/12/2023   CALCIUM 9.2 10/12/2023   MG 1.8 06/17/2023   PHOS 2.9 04/18/2023    Bone Lab Results  Component Value Date   VD25OH 34.34 10/12/2023    Inflammation (CRP: Acute Phase) (ESR: Chronic Phase) Lab Results  Component Value Date   CRP 3.2 (H) 10/12/2023   ESRSEDRATE 63 (H) 10/12/2023   LATICACIDVEN 0.8 04/16/2023         Note: Above Lab results reviewed.  Recent Imaging Review  CT CERVICAL SPINE WO CONTRAST CLINICAL DATA:  68 year old female with previous cervical spine surgery, C3 laminar screw lucency on x-ray.  EXAM: CT CERVICAL SPINE WITHOUT CONTRAST  TECHNIQUE: Multidetector CT imaging of the cervical spine was performed without intravenous contrast. Multiplanar CT image reconstructions were also generated.  RADIATION DOSE REDUCTION: This exam was performed according to the departmental dose-optimization program which includes automated exposure control, adjustment of the mA and/or kV according to patient size and/or use of iterative reconstruction technique.  COMPARISON:  Cervical spine CT 04/16/2023. Cervical spine MRI 04/16/2023.  FINDINGS: Alignment: Stable. Straightening and mild  reversal of the normal cervical lordosis. Cervicothoracic junction alignment is within normal limits. Maintained posterior element alignment.  Skull base and vertebrae: Postoperative changes which are detailed below. Background bone mineralization within normal limits. Visualized skull base is intact. No atlanto-occipital dissociation. Cervical vertebrae appear intact.  Soft tissues and spinal canal: Hardware streak artifact C3-C4 through C6-C7. Postoperative changes to the posterior paraspinal soft tissues. Otherwise negative visible noncontrast neck soft tissues.  Disc levels:  C2-C3: Chronic degenerative appearing facet ankylosis on the left, underlying facet hypertrophy.  C3-C4: Posterior decompression and fusion. Progressive lucency along both C3 laminar screws (on the left series 4, image 40). And lesser lucency surrounding the right C4 screw at the tip (series 6, image 48). No solid arthrodesis.  C4-C5: Posterior decompression and fusion. Right C4 laminar screws described above. No C5 screw loosening is identified. And there seems to be developing facet arthrodesis on the left series 4, image 38.  C5-C6: Posterior decompression and fusion. Loosening of both C6 laminar screws with abundant lucency such as on sagittal image 40 on the left. No arthrodesis.  C6-C7:  Stable chronic endplate and facet spurring.  C7-T1:  Stable mild endplate and left side facet spurring.  Upper chest: Visible upper thoracic levels appear intact. Lung apices appear hyperinflated but clear; minor left apical lung scarring.  Other: Cervicomedullary junction is within normal limits. Negative visible noncontrast brain parenchyma. TMJ degeneration.  IMPRESSION: 1. Sequelae of posterior decompression and bilateral laminar fusion hardware placement C3-C4 through C6-C7. 2. Loosening of the bilateral C3 and C6 laminar screws, also the right C4 screw. 3. No postoperative arthrodesis identified,  except for possible developing left C4-C5 facet fusion. Underlying degenerative appearing facet ankylosis on the left at C2-C3.  Electronically Signed   By: VEAR Hurst M.D.   On: 08/19/2023 12:25 Note: Reviewed        Physical Exam  Vitals: BP 118/62   Pulse 94   Temp 98.4 F (36.9 C)   Resp 16   Ht 4' 11.5 (1.511 m)   Wt 120 lb (54.4 kg)   SpO2 99%   BMI 23.83  kg/m  BMI: Estimated body mass index is 23.83 kg/m as calculated from the following:   Height as of this encounter: 4' 11.5 (1.511 m).   Weight as of this encounter: 120 lb (54.4 kg). Ideal: Patient must be at least 60 in tall to calculate ideal body weight General appearance: Well nourished, well developed, and well hydrated. In no apparent acute distress Mental status: Alert, oriented x 3 (person, place, & time)       Respiratory: No evidence of acute respiratory distress Eyes: PERLA  Cervical Spine Area Exam  Skin & Axial Inspection: No masses, redness, edema, swelling, or associated skin lesions Alignment: Symmetrical Functional ROM: Pain restricted ROM, to the left Stability: No instability detected Muscle Tone/Strength: Functionally intact. No obvious neuro-muscular anomalies detected. Sensory (Neurological): Dermatomal pain pattern ; bilateral occipital neuralgia Palpation: No palpable anomalies             + Spurlings left Upper Extremity (UE) Exam      Side: Right upper extremity   Side: Left upper extremity  Skin & Extremity Inspection: Skin color, temperature, and hair growth are WNL. No peripheral edema or cyanosis. No masses, redness, swelling, asymmetry, or associated skin lesions. No contractures.   Skin & Extremity Inspection: Skin color, temperature, and hair growth are WNL. No peripheral edema or cyanosis. No masses, redness, swelling, asymmetry, or associated skin lesions. No contractures.  Functional ROM: Unrestricted ROM           Functional ROM: Unrestricted ROM          Muscle Tone/Strength:  Functionally intact. No obvious neuro-muscular anomalies detected.   Muscle Tone/Strength: Functionally intact. No obvious neuro-muscular anomalies detected.  Sensory (Neurological): Unimpaired           Sensory (Neurological): Dermatomal pain pattern          Palpation: No palpable anomalies               Palpation: No palpable anomalies              Provocative Test(s):  Phalen's test: deferred Tinel's test: deferred Apley's scratch test (touch opposite shoulder):  Action 1 (Across chest): deferred Action 2 (Overhead): deferred Action 3 (LB reach): deferred     Provocative Test(s):  Phalen's test: deferred Tinel's test: deferred Apley's scratch test (touch opposite shoulder):  Action 1 (Across chest): Decreased ROM Action 2 (Overhead): Decreased ROM Action 3 (LB reach): Decreased ROM    Assessment   Diagnosis Status  1. Cervical radicular pain (left severe)   2. Foraminal stenosis of cervical region (severe b/l C5/6; C6/7)   3. Bilateral occipital neuralgia    Controlled Controlled Controlled   Updated Problems: No problems updated.  Plan of Care  Problem-specific:  Assessment and Plan    Chronic cervical post-surgical pain with radiculopathy   She experiences chronic cervical post-surgical pain with radiculopathy, mainly on the left side, accompanied by occipital headaches. A previous cervical injection provided only short-term relief. Considering an occipital nerve block and cervical epidural steroid injection (ESI) for pain management. Prefers sedation due to difficulty remaining still during procedures. Schedule cervical ESI and occipital nerve block for next Wednesday with IV sedation. Consult neurosurgeons regarding the use of a collar if needed.  Anticoagulation management for procedural planning   She is on Eliquis  for anticoagulation, which requires temporary discontinuation for the planned cervical ESI and occipital nerve block. Previously stopped Eliquis  for  procedures without issues. Will substitute with baby aspirin during discontinuation. Obtain clearance from  her primary care provider to stop Eliquis  three days before the procedure. Instruct her to take the last dose of Eliquis  on Saturday before the procedure and start taking a baby aspirin daily starting Sunday. Contact the clinic if her primary care provider does not approve stopping Eliquis .       [name] has a current medication list which includes the following long-term medication(s): clonazepam , combivent  respimat, duloxetine , dupixent , eliquis , fluticasone -salmeterol, gabapentin , ipratropium-albuterol , montelukast , pantoprazole , ropinirole , sumatriptan , and trazodone .  Pharmacotherapy (Medications Ordered): No orders of the defined types were placed in this encounter.  Orders:  Orders Placed This Encounter  Procedures   Cervical Epidural Injection    Sedation: Patient's choice. Purpose: Diagnostic/Therapeutic Indication(s): Radiculitis and cervicalgia associater with cervical degenerative disc disease.    Standing Status:   Future    Expected Date:   11/09/2023    Expiration Date:   10/25/2024    Scheduling Instructions:     Procedure: Cervical Epidural Steroid Injection/Block     Level(s): C7-T1     Laterality: TBD     Timeframe: As soon as schedule allows.    Where will this procedure be performed?:   [facility]             Kyzer Blowe   GREATER OCCIPITAL NERVE BLOCK    Standing Status:   Future    Expiration Date:   01/26/2024    Scheduling Instructions:     Procedure: Occipital nerve block     Laterality: Bilateral     Sedation: IV Versed      Timeframe: ASAA    Where will this procedure be performed?:   [facility]    Return in about 8 days (around 11/03/2023) for C-ESI + B/L GONB , in clinic IV Versed  (stop Eliquis  3 days prior, ok to take ASA 81 instead).    Recent Visits No visits were found meeting these conditions. Showing recent  visits within past 90 days and meeting all other requirements Today's Visits Date Type Provider Dept  10/26/23 Office Visit Marcelino Nurse, MD [facility]  Showing today's visits and meeting all other requirements Future Appointments Date Type Provider Dept  11/03/23 Appointment Marcelino Nurse, MD [facility]  Showing future appointments within next 90 days and meeting all other requirements  I discussed the assessment and treatment plan with the patient. The patient was provided an opportunity to ask questions and all were answered. The patient agreed with the plan and demonstrated an understanding of the instructions.  Patient advised to call back or seek an in-person evaluation if the symptoms or condition worsens.  Duration of encounter: .  Total time on encounter, as per AMA guidelines included both the face-to-face and non-face-to-face time personally spent by the physician and/or other qualified health care professional(s) on the day of the encounter (includes time in activities that require the physician or other qualified health care professional and does not include time in activities normally performed by clinical staff). Physician's time may include the following activities when performed: Preparing to see the patient (e.g., pre-charting review of records, searching for previously ordered imaging, lab work, and nerve conduction tests) Review of prior analgesic pharmacotherapies. Reviewing PMP Interpreting ordered tests (e.g., lab work, imaging, nerve conduction tests) Performing post-procedure evaluations, including interpretation of diagnostic procedures Obtaining and/or reviewing separately obtained history Performing a medically appropriate examination and/or evaluation Counseling and educating the patient/family/caregiver Ordering medications, tests, or procedures Referring and communicating with other health care professionals (when  not separately  reported) Documenting clinical information in the electronic or other health record Independently interpreting results (not separately reported) and communicating results to the patient/ family/caregiver Care coordination (not separately reported)  Note by: Wallie Sherry, MD (TTS and AI technology used. I apologize for any typographical errors that were not detected and corrected.) Date: 10/26/2023; Time: 3:54 PM

## 2023-10-26 NOTE — Patient Instructions (Addendum)
 Need clearance to stop Eliquis  3 days prior, ok to take ASA 81 mg instead during those 3 days  Occipital Nerve Block Patient Information  Description: The occipital nerves originate in the cervical (neck) spinal cord and travel upward through muscle and tissue to supply sensation to the back of the head and top of the scalp.  In addition, the nerves control some of the muscles of the scalp.  Occipital neuralgia is an irritation of these nerves which can cause headaches, numbness of the scalp, and neck discomfort.     The occipital nerve block will interrupt nerve transmission through these nerves and can relieve pain and spasm.  The block consists of insertion of a small needle under the skin in the back of the head to deposit local anesthetic (numbing medicine) and/or steroids around the nerve.  The entire block usually lasts less than 5 minutes.  Conditions which may be treated by occipital blocks:  Muscular pain and spasm of the scalp Nerve irritation, back of the head Headaches Upper neck pain  Preparation for the injection:  Do not eat any solid food or dairy products within 8 hours of your appointment. You may drink clear liquids up to 3 hours before appointment.  Clear liquids include water , black coffee, juice or soda.  No milk or cream please. You may take your regular medication, including pain medications, with a sip of water  before you appointment.  Diabetics should hold regular insulin  (if taken separately) and take 1/2 normal NPH dose the morning of the procedure.  Carry some sugar containing items with you to your appointment. A driver must accompany you and be prepared to drive you home after your procedure. Bring all your current medications with you. An IV may be inserted and sedation may be given at the discretion of the physician. A blood pressure cuff, EKG, and other monitors will often be applied during the procedure.  Some patients may need to have extra oxygen   administered for a short period. You will be asked to provide medical information, including your allergies and medications, prior to the procedure.  We must know immediately if you are taking blood thinners (like Coumadin/Warfarin) or if you are allergic to IV iodine contrast (dye).  We must know if you could possible be pregnant.  Do not wear a high collared shirt or turtleneck.  Tie long hair up in the back if possible.  Possible side-effects:  Bleeding from needle site Infection (rare, may require surgery) Nerve injury (rare) Hair on back of neck can be tinged with iodine scrub (this will wash out) Light-headedness (temporary) Pain at injection site (several days) Decreased blood pressure (rare, temporary) Seizure (very rare)  Call if you experience:  Hives or difficulty breathing ( go to the emergency room) Inflammation or drainage at the injection site(s)  Please note:  Although the local anesthetic injected can often make your painful muscles or headache feel good for several hours after the injection, the pain may return.  It takes 3-7 days for steroids to work.  You may not notice any pain relief for at least one week.  If effective, we will often do a series of injections spaced 3-6 weeks apart to maximally decrease your pain.  If you have any questions, please call [phone] [facility] GENERAL RISKS AND COMPLICATIONS  What are the risk, side effects and possible complications? Generally speaking, most procedures are safe.  However, with any procedure there are risks, side effects,  and the possibility of complications.  The risks and complications are dependent upon the sites that are lesioned, or the type of nerve block to be performed.  The closer the procedure is to the spine, the more serious the risks are.  Great care is taken when placing the radio frequency needles, block needles or lesioning probes, but sometimes complications  can occur. Infection: Any time there is an injection through the skin, there is a risk of infection.  This is why sterile conditions are used for these blocks.  There are four possible types of infection. Localized skin infection. Central Nervous System Infection-This can be in the form of Meningitis, which can be deadly. Epidural Infections-This can be in the form of an epidural abscess, which can cause pressure inside of the spine, causing compression of the spinal cord with subsequent paralysis. This would require an emergency surgery to decompress, and there are no guarantees that the patient would recover from the paralysis. Discitis-This is an infection of the intervertebral discs.  It occurs in about 1% of discography procedures.  It is difficult to treat and it may lead to surgery.        2. Pain: the needles have to go through skin and soft tissues, will cause soreness.       3. Damage to internal structures:  The nerves to be lesioned may be near blood vessels or    other nerves which can be potentially damaged.       4. Bleeding: Bleeding is more common if the patient is taking blood thinners such as  aspirin, Coumadin, Ticiid, Plavix, etc., or if he/she have some genetic predisposition  such as hemophilia. Bleeding into the spinal canal can cause compression of the spinal  cord with subsequent paralysis.  This would require an emergency surgery to  decompress and there are no guarantees that the patient would recover from the  paralysis.       5. Pneumothorax:  Puncturing of a lung is a possibility, every time a needle is introduced in  the area of the chest or upper back.  Pneumothorax refers to free air around the  collapsed lung(s), inside of the thoracic cavity (chest cavity).  Another two possible  complications related to a similar event would include: Hemothorax and Chylothorax.   These are variations of the Pneumothorax, where instead of air around the collapsed  lung(s), you may have  blood or chyle, respectively.       6. Spinal headaches: They may occur with any procedures in the area of the spine.       7. Persistent CSF (Cerebro-Spinal Fluid) leakage: This is a rare problem, but may occur  with prolonged intrathecal or epidural catheters either due to the formation of a fistulous  track or a dural tear.       8. Nerve damage: By working so close to the spinal cord, there is always a possibility of  nerve damage, which could be as serious as a permanent spinal cord injury with  paralysis.       9. Death:  Although rare, severe deadly allergic reactions known as Anaphylactic  reaction can occur to any of the medications used.      10. Worsening of the symptoms:  We can always make thing worse.  What are the chances of something like this happening? Chances of any of this occuring are extremely low.  By statistics, you have more of a chance of getting killed in a motor  vehicle accident: while driving to the hospital than any of the above occurring .  Nevertheless, you should be aware that they are possibilities.  In general, it is similar to taking a shower.  Everybody knows that you can slip, hit your head and get killed.  Does that mean that you should not shower again?  Nevertheless always keep in mind that statistics do not mean anything if you happen to be on the wrong side of them.  Even if a procedure has a 1 (one) in a 1,000,000 (million) chance of going wrong, it you happen to be that one..Also, keep in mind that by statistics, you have more of a chance of having something go wrong when taking medications.  Who should not have this procedure? If you are on a blood thinning medication (e.g. Coumadin, Plavix, see list of Blood Thinners), or if you have an active infection going on, you should not have the procedure.  If you are taking any blood thinners, please inform your physician.  How should I prepare for this procedure? Do not eat or drink anything at least six  hours prior to the procedure. Bring a driver with you .  It cannot be a taxi. Come accompanied by an adult that can drive you back, and that is strong enough to help you if your legs get weak or numb from the local anesthetic. Take all of your medicines the morning of the procedure with just enough water  to swallow them. If you have diabetes, make sure that you are scheduled to have your procedure done first thing in the morning, whenever possible. If you have diabetes, take only half of your insulin  dose and notify our nurse that you have done so as soon as you arrive at the clinic. If you are diabetic, but only take blood sugar pills (oral hypoglycemic), then do not take them on the morning of your procedure.  You may take them after you have had the procedure. Do not take aspirin or any aspirin-containing medications, at least eleven (11) days prior to the procedure.  They may prolong bleeding. Wear loose fitting clothing that may be easy to take off and that you would not mind if it got stained with Betadine or blood. Do not wear any jewelry or perfume Remove any nail coloring.  It will interfere with some of our monitoring equipment.  NOTE: Remember that this is not meant to be interpreted as a complete list of all possible complications.  Unforeseen problems may occur.  BLOOD THINNERS The following drugs contain aspirin or other products, which can cause increased bleeding during surgery and should not be taken for 2 weeks prior to and 1 week after surgery.  If you should need take something for relief of minor pain, you may take acetaminophen  which is found in Tylenol ,m Datril, Anacin-3 and Panadol. It is not blood thinner. The products listed below are.  Do not take any of the products listed below in addition to any listed on your instruction sheet.  A.P.C or A.P.C with Codeine Codeine Phosphate Capsules #3 Ibuprofen  Ridaura  ABC compound Congesprin Imuran rimadil  Advil  Cope Indocin  Robaxisal  Alka-Seltzer Effervescent Pain Reliever and Antacid Coricidin or Coricidin-D  Indomethacin Rufen   Alka-Seltzer plus Cold Medicine Cosprin Ketoprofen S-A-C Tablets  Anacin Analgesic Tablets or Capsules Coumadin Korlgesic Salflex  Anacin Extra Strength Analgesic tablets or capsules CP-2 Tablets Lanoril Salicylate  Anaprox Cuprimine Capsules Levenox Salocol  Anexsia-D Dalteparin Magan Salsalate  Anodynos Darvon compound  Magnesium  Salicylate Sine-off  Ansaid Dasin Capsules Magsal Sodium Salicylate  Anturane Depen Capsules Marnal Soma  APF Arthritis pain formula Dewitt's Pills Measurin Stanback  Argesic Dia-Gesic Meclofenamic Sulfinpyrazone  Arthritis Bayer Timed Release Aspirin Diclofenac  Meclomen Sulindac  Arthritis pain formula Anacin Dicumarol Medipren  Supac  Analgesic (Safety coated) Arthralgen Diffunasal Mefanamic Suprofen  Arthritis Strength Bufferin Dihydrocodeine Mepro Compound Suprol  Arthropan liquid Dopirydamole Methcarbomol with Aspirin Synalgos  ASA tablets/Enseals Disalcid Micrainin Tagament  Ascriptin Doan's Midol  Talwin  Ascriptin A/D Dolene Mobidin Tanderil  Ascriptin Extra Strength Dolobid Moblgesic Ticlid  Ascriptin with Codeine Doloprin or Doloprin with Codeine Momentum Tolectin  Asperbuf Duoprin Mono-gesic Trendar  Aspergum Duradyne Motrin  or Motrin  IB Triminicin  Aspirin plain, buffered or enteric coated Durasal Myochrisine Trigesic  Aspirin Suppositories Easprin Nalfon Trillsate  Aspirin with Codeine Ecotrin Regular or Extra Strength Naprosyn Uracel  Atromid-S Efficin Naproxen Ursinus  Auranofin Capsules Elmiron Neocylate Vanquish  Axotal Emagrin Norgesic Verin  Azathioprine Empirin or Empirin with Codeine Normiflo Vitamin E  Azolid Emprazil Nuprin  Voltaren   Bayer Aspirin plain, buffered or children's or timed BC Tablets or powders Encaprin Orgaran Warfarin Sodium  Buff-a-Comp Enoxaparin  Orudis Zorpin  Buff-a-Comp with Codeine Equegesic Os-Cal-Gesic    Buffaprin Excedrin plain, buffered or Extra Strength Oxalid   Bufferin Arthritis Strength Feldene Oxphenbutazone   Bufferin plain or Extra Strength Feldene Capsules Oxycodone  with Aspirin   Bufferin with Codeine Fenoprofen Fenoprofen Pabalate or Pabalate-SF   Buffets II Flogesic Panagesic   Buffinol plain or Extra Strength Florinal or Florinal with Codeine Panwarfarin   Buf-Tabs Flurbiprofen Penicillamine   Butalbital  Compound Four-way cold tablets Penicillin   Butazolidin Fragmin Pepto-Bismol   Carbenicillin Geminisyn Percodan   Carna Arthritis Reliever Geopen Persantine   Carprofen Gold's salt Persistin   Chloramphenicol Goody's Phenylbutazone   Chloromycetin Haltrain Piroxlcam   Clmetidine heparin  Plaquenil   Cllnoril Hyco-pap Ponstel   Clofibrate Hydroxy chloroquine Propoxyphen         Before stopping any of these medications, be sure to consult the physician who ordered them.  Some, such as Coumadin (Warfarin) are ordered to prevent or treat serious conditions such as deep thrombosis, pumonary embolisms, and other heart problems.  The amount of time that you may need off of the medication may also vary with the medication and the reason for which you were taking it.  If you are taking any of these medications, please make sure you notify your pain physician before you undergo any procedures.         Moderate Conscious Sedation, Adult Sedation is the use of medicines to help you relax and not feel pain. Moderate conscious sedation is a type of sedation that makes you less alert than normal. You are still able to respond to instructions, touch, or both. This type of sedation is used during short medical and dental procedures. It is milder than deep sedation, which is a type of sedation you cannot be easily woken up from. It is also milder than general anesthesia, which is the use of medicines to make you fall asleep. Moderate conscious sedation lets you return to your normal  activities sooner. Tell a health care provider about: Any allergies you have. All medicines you are taking, including vitamins, herbs, steroids, eye drops, creams, and over-the-counter medicines. Any problems you or family members have had with anesthesia. Any bleeding problems you have. Any surgeries you have had. Any medical conditions you have. Whether you are pregnant or may be pregnant. Any recent alcohol, tobacco, or drug  use. What are the risks? Your health care provider will talk with you about risks. These may include: Oversedation. This is when you get too much medicine. Nausea or vomiting. Allergic reaction to medicines. Trouble breathing. If this happens, a breathing tube may be used. It will be removed when you can breathe better on your own. Heart trouble. Lung trouble. Emergence delirium. This is when you feel confused while the sedation wears off. This gets better with time. What happens before the procedure? When to stop eating and drinking Follow instructions from your health care provider about what you may eat and drink. These may include: 8 hours before your procedure Stop eating most foods. Do not eat meat, fried foods, or fatty foods. Eat only light foods, such as toast or crackers. All liquids are okay except energy drinks and alcohol. 6 hours before your procedure Stop eating. Drink only clear liquids, such as water , clear fruit juice, black coffee, plain tea, and sports drinks. Do not drink energy drinks or alcohol. 2 hours before your procedure Stop drinking all liquids. You may be allowed to take medicines with small sips of water . If you do not follow your health care provider's instructions, your procedure may be delayed or canceled. Medicines Ask your health care provider about: Changing or stopping your regular medicines. These include any diabetes medicines or blood thinners you take. Taking medicines such as aspirin and ibuprofen . These medicines  can thin your blood. Do not take them unless your health care provider tells you to. Taking over-the-counter medicines, vitamins, herbs, and supplements. Tests and exams You may have an exam or testing. You may have a blood or urine sample taken. General instructions Do not use any products that contain nicotine or tobacco for at least 4 weeks before the procedure. These products include cigarettes, chewing tobacco, and vaping devices, such as e-cigarettes. If you need help quitting, ask your health care provider. If you will be going home right after the procedure, plan to have a responsible adult: Take you home from the hospital or clinic. You will not be allowed to drive. Care for you for the time you are told. What happens during the procedure?  You will be given the sedative. It may be given: As a pill you can take by mouth. It can also be put into the rectum. As a spray through the nose. As an injection into muscle. As an injection into a vein through an IV. You may be given oxygen  as needed. Your blood pressure, heart rate, breathing rate, and blood oxygen  level will be monitored during the procedure. The medical or dental procedure will be done. The procedure may vary among health care providers and hospitals. What happens after the procedure? Your blood pressure, heart rate, breathing rate, and blood oxygen  level will be monitored until you leave the hospital or clinic. You will get fluids through an IV as needed. Do not drive or operate machinery until your health care provider says that it is safe. This information is not intended to replace advice given to you by your health care provider. Make sure you discuss any questions you have with your health care provider. Document Revised: 09/22/2021 Document Reviewed: 09/22/2021 Elsevier Patient Education  2024 [employer]Epidural Steroid Injection Patient Information  Description: The epidural space surrounds the nerves as  they exit the spinal cord.  In some patients, the nerves can be compressed and inflamed by a bulging disc or a tight spinal canal (spinal stenosis).  By injecting  steroids into the epidural space, we can bring irritated nerves into direct contact with a potentially helpful medication.  These steroids act directly on the irritated nerves and can reduce swelling and inflammation which often leads to decreased pain.  Epidural steroids may be injected anywhere along the spine and from the neck to the low back depending upon the location of your pain.   After numbing the skin with local anesthetic (like Novocaine), a small needle is passed into the epidural space slowly.  You may experience a sensation of pressure while this is being done.  The entire block usually last less than 10 minutes.  Conditions which may be treated by epidural steroids:  Low back and leg pain Neck and arm pain Spinal stenosis Post-laminectomy syndrome Herpes zoster (shingles) pain Pain from compression fractures  Preparation for the injection:  Do not eat any solid food or dairy products within 8 hours of your appointment.  You may drink clear liquids up to 3 hours before appointment.  Clear liquids include water , black coffee, juice or soda.  No milk or cream please. You may take your regular medication, including pain medications, with a sip of water  before your appointment  Diabetics should hold regular insulin  (if taken separately) and take 1/2 normal NPH dos the morning of the procedure.  Carry some sugar containing items with you to your appointment. A driver must accompany you and be prepared to drive you home after your procedure.  Bring all your current medications with your. An IV may be inserted and sedation may be given at the discretion of the physician.   A blood pressure cuff, EKG and other monitors will often be applied during the procedure.  Some patients may need to have extra oxygen  administered for a short  period. You will be asked to provide medical information, including your allergies, prior to the procedure.  We must know immediately if you are taking blood thinners (like Coumadin/Warfarin)  Or if you are allergic to IV iodine contrast (dye). We must know if you could possible be pregnant.  Possible side-effects: Bleeding from needle site Infection (rare, may require surgery) Nerve injury (rare) Numbness & tingling (temporary) Difficulty urinating (rare, temporary) Spinal headache ( a headache worse with upright posture) Light -headedness (temporary) Pain at injection site (several days) Decreased blood pressure (temporary) Weakness in arm/leg (temporary) Pressure sensation in back/neck (temporary)  Call if you experience: Fever/chills associated with headache or increased back/neck pain. Headache worsened by an upright position. New onset weakness or numbness of an extremity below the injection site Hives or difficulty breathing (go to the emergency room) Inflammation or drainage at the infection site Severe back/neck pain Any new symptoms which are concerning to you  Please note:  Although the local anesthetic injected can often make your back or neck feel good for several hours after the injection, the pain will likely return.  It takes 3-7 days for steroids to work in the epidural space.  You may not notice any pain relief for at least that one week.  If effective, we will often do a series of three injections spaced 3-6 weeks apart to maximally decrease your pain.  After the initial series, we generally will wait several months before considering a repeat injection of the same type.  If you have any questions, please call [phone] [facility]

## 2023-10-26 NOTE — Progress Notes (Signed)
 Safety precautions to be maintained throughout the outpatient stay will include: orient to surroundings, keep bed in low position, maintain call bell within reach at all times, provide assistance with transfer out of bed and ambulation.

## 2023-10-29 ENCOUNTER — Encounter: Payer: Self-pay | Admitting: Nurse Practitioner

## 2023-10-29 ENCOUNTER — Ambulatory Visit: Admitting: Nurse Practitioner

## 2023-10-29 ENCOUNTER — Telehealth: Payer: Self-pay | Admitting: Nurse Practitioner

## 2023-10-29 VITALS — BP 113/58 | HR 88 | Temp 98.3°F | Resp 16 | Ht 59.5 in | Wt 117.4 lb

## 2023-10-29 DIAGNOSIS — E1169 Type 2 diabetes mellitus with other specified complication: Secondary | ICD-10-CM | POA: Diagnosis not present

## 2023-10-29 DIAGNOSIS — M5412 Radiculopathy, cervical region: Secondary | ICD-10-CM

## 2023-10-29 DIAGNOSIS — E876 Hypokalemia: Secondary | ICD-10-CM

## 2023-10-29 DIAGNOSIS — Z1231 Encounter for screening mammogram for malignant neoplasm of breast: Secondary | ICD-10-CM

## 2023-10-29 DIAGNOSIS — J4540 Moderate persistent asthma, uncomplicated: Secondary | ICD-10-CM

## 2023-10-29 DIAGNOSIS — Z0000 Encounter for general adult medical examination without abnormal findings: Secondary | ICD-10-CM | POA: Diagnosis not present

## 2023-10-29 DIAGNOSIS — J301 Allergic rhinitis due to pollen: Secondary | ICD-10-CM

## 2023-10-29 MED ORDER — ACCU-CHEK GUIDE ME W/DEVICE KIT: PACK | 0 refills | Status: AC

## 2023-10-29 MED ORDER — ACCU-CHEK SOFTCLIX LANCETS MISC: 12 refills | Status: DC

## 2023-10-29 MED ORDER — ACCU-CHEK GUIDE TEST VI STRP: ORAL_STRIP | 12 refills | Status: DC

## 2023-10-29 MED ORDER — POTASSIUM CHLORIDE ER 10 MEQ PO TBCR: 10.0000 meq | EXTENDED_RELEASE_TABLET | Freq: Every day | ORAL | 1 refills | Status: DC

## 2023-10-29 NOTE — Telephone Encounter (Signed)
 Medical clearance completed. Faxed back to [facility]; [phone]. Scanned-Toni

## 2023-10-29 NOTE — Progress Notes (Signed)
 [facility] [address] [place], [place] [address]  Internal MEDICINE  Office Visit Note  Patient Name: Summer Murphy  [id number]  [id number]  Date of Service: 10/29/2023  Chief Complaint  Patient presents with   Diabetes   Gastroesophageal Reflux   Hypertension   Medicare Wellness    HPI Kaila presents for a medicare annual wellness visit.  Well-appearing 68 y.o. female with hypertension, COPD, allergic rhinitis, asthma, neuropathy, diabetes, IDA, depression, low back pain, RLS, and anxiety.  Routine CRC screening: due in 2028  Routine mammogram: due now, was last done in July last year. Goes to [facility] imaging.  DEXA scan: done in 2022 Pap smear: discontinued, aged out  Labs: has labs to repeat for potassium. Last potassium was still a little low after stopping the supplemental potassium.  New or worsening pain: chronic pain esp neck, reports that the infection in her cervical spine came back and she is on antibiotics 4 times daily for an extended period of time per ID.  Other concerns: neurosurgery wants her to gain 13 more lbs which would put her at 130 lbs which is an elevated BMI of >26. She is currently 117 lbs and is happy with this weight. Her BMI is normal at 23.32. with the infection and being treated for this, she can maintain her weight but gaining weight is difficult. Patient was assured that her current weight is stable and within normal limits. She does not need to gain to 130 lbs.  BP is good and she is no longer on any blood pressure medications.  Her breathing has greatly improved since starting dupixent  injections for her asthma. She hardly every has to use her albuterol  inhaler anymore.      10/29/2023   10:55 AM 10/06/2022    2:17 PM 09/18/2021   11:02 AM  MMSE - Mini Mental State Exam  Orientation to time 5 5 5   Orientation to Place 5 5 5   Registration 3 3 3   Attention/ Calculation 5 5 5   Recall 3 3 3   Language- name 2 objects 2 2 2    Language- repeat 1 1 1   Language- follow 3 step command 3 3 3   Language- read & follow direction 1 1 1   Write a sentence 0 1 1  Copy design 1 1 1   Total score 29 30 30     Functional Status Survey: Is the patient deaf or have difficulty hearing?: Yes Does the patient have difficulty seeing, even when wearing glasses/contacts?: No Does the patient have difficulty concentrating, remembering, or making decisions?: No Does the patient have difficulty walking or climbing stairs?: No Does the patient have difficulty dressing or bathing?: No Does the patient have difficulty doing errands alone such as visiting a doctor's office or shopping?: Yes     07/13/2023    9:11 AM 10/07/2023    3:05 PM 10/12/2023    8:58 AM 10/26/2023    2:38 PM 10/29/2023   10:53 AM  Fall Risk  Falls in the past year? 1 0 0 0 0  Was there an injury with Fall? 1    0  Fall Risk Category Calculator 2    0  Patient at Risk for Falls Due to No Fall Risks  No Fall Risks  No Fall Risks  Fall risk Follow up Falls evaluation completed  Falls evaluation completed  Falls evaluation completed       10/29/2023   10:53 AM  Depression screen PHQ 2/9  Decreased  Interest 0  Down, Depressed, Hopeless 0  PHQ - 2 Score 0       Current Medication: Outpatient Encounter Medications as of 10/29/2023  Medication Sig Note   Accu-Chek Softclix Lancets lancets Use 1 lancet to check glucose once daily and as needed for prediabetes R73.03    Blood Glucose Monitoring Suppl (ACCU-CHEK GUIDE ME) w/Device KIT Use device to check glucose daily and as need for prediabetes R73.03    glucose blood (ACCU-CHEK GUIDE TEST) test strip Use 1 test strip to check glucose once daily and as needed for prediabetes R73.03    potassium chloride  (KLOR-CON  10) 10 MEQ tablet Take 1 tablet (10 mEq total) by mouth daily.    acetaminophen  (TYLENOL ) 500 MG tablet Take 2 tablets (1,000 mg total) by mouth every 6 (six) hours as needed. 04/22/2023: prn   alendronate   (FOSAMAX ) 70 MG tablet TAKE 1 TABLET BY MOUTH WEEKLY  TAKE WITH A FULL GLASS OF WATER   ON AN EMPTY STOMACH    busPIRone  (BUSPAR ) 10 MG tablet TAKE 1 TABLET(10 MG) BY MOUTH TWICE DAILY    cefadroxil  (DURICEF) 500 MG capsule Take 2 capsules (1,000 mg total) by mouth 2 (two) times daily.    clonazePAM  (KLONOPIN ) 1 MG tablet Take 0.5-1 tablets (0.5-1 mg total) by mouth 2 (two) times daily as needed for anxiety.    COMBIVENT  RESPIMAT 20-100 MCG/ACT AERS respimat 1 puff every 6 (six) hours. (Patient taking differently: Inhale 1 puff into the lungs every 6 (six) hours. As needed)    diclofenac  Sodium (VOLTAREN ) 1 % GEL Apply 4 g topically 4 (four) times daily. (Patient taking differently: Apply 4 g topically 4 (four) times daily as needed (pain).) 04/22/2023: prn   DULoxetine  (CYMBALTA ) 60 MG capsule Take 1 capsule (60 mg total) by mouth at bedtime.    Dupilumab  (DUPIXENT ) 300 MG/2ML SOAJ Inject 300 mg into the skin every 14 (fourteen) days. **loading dose completed in office on 07/02/23**    ELIQUIS  5 MG TABS tablet TAKE 1 TABLET(5 MG) BY MOUTH TWICE DAILY    fluticasone -salmeterol (WIXELA INHUB) 250-50 MCG/ACT AEPB Inhale 1 puff into the lungs in the morning and at bedtime.    gabapentin  (NEURONTIN ) 300 MG capsule Take 1 capsule (300 mg total) by mouth 2 (two) times daily.    HYDROcodone -acetaminophen  (NORCO/VICODIN) 5-325 MG tablet Take 1 tablet by mouth 2 (two) times daily as needed for severe pain (pain score 7-10). (Patient not taking: Reported on 10/26/2023)    ipratropium-albuterol  (DUONEB) 0.5-2.5 (3) MG/3ML SOLN Take 3 mLs by nebulization every 6 (six) hours as needed.    lidocaine  (LIDODERM ) 5 % Place 1 patch onto the skin daily. To affected area of the neck. Remove & Discard patch within 12 hours or as directed by MD    methocarbamol  (ROBAXIN ) 750 MG tablet Take 1 tablet (750 mg total) by mouth every 8 (eight) hours as needed for muscle spasms.    methylPREDNISolone  (MEDROL  DOSEPAK) 4 MG TBPK tablet  Take as Directed, per package instructions (Patient not taking: Reported on 10/26/2023)    montelukast  (SINGULAIR ) 10 MG tablet TAKE 1 TABLET(10 MG) BY MOUTH AT BEDTIME    ondansetron  (ZOFRAN ) 4 MG tablet TAKE 1 TABLET BY MOUTH TWICE DAILY AS NEEDED    oxybutynin  (DITROPAN -XL) 10 MG 24 hr tablet TAKE 1 TABLET(10 MG) BY MOUTH AT BEDTIME    pantoprazole  (PROTONIX ) 40 MG tablet Take 1 tablet (40 mg total) by mouth 2 (two) times daily.    polyethylene glycol (MIRALAX  /  GLYCOLAX ) 17 g packet Take 17 g by mouth 2 (two) times daily. (Patient taking differently: Take 17 g by mouth daily as needed.)    rOPINIRole  (REQUIP ) 1 MG tablet Take 1 tablet (1 mg total) by mouth at bedtime.    SUMAtriptan  (IMITREX ) 100 MG tablet Take 1 tablet (100 mg total) by mouth every 2 (two) hours as needed (for migraine headaches.). May repeat in 1 hours if headache persists or recurs.    traZODone  (DESYREL ) 150 MG tablet TAKE 1 TABLET(150 MG) BY MOUTH AT BEDTIME    No facility-administered encounter medications on file as of 10/29/2023.    Surgical History: Past Surgical History:  Procedure Laterality Date   ABDOMINAL HYSTERECTOMY     AMPUTATION TOE Left 07/31/2016   Procedure: AMPUTATION TOE/MPJ 2nd toe;  Surgeon: Neill Boas, DPM;  Location: [facility] OR[facility];  Service: Podiatry;  Laterality: Left;   APPENDECTOMY  1990   BACK SURGERY     low back   BREAST SURGERY     bilateral breast reduction   CARDIAC ELECTROPHYSIOLOGY STUDY AND ABLATION     CERVICAL WOUND DEBRIDEMENT N/A 04/17/2023   Procedure: CERVICAL WOUND DEBRIDEMENT;  Surgeon: Bluford Standing, MD;  Location: [facility];  Service: Neurosurgery;  Laterality: N/A;   CHOLECYSTECTOMY  1990   COLONOSCOPY WITH PROPOFOL  N/A 01/04/2017   Procedure: COLONOSCOPY WITH PROPOFOL ;  Surgeon: Viktoria Lamar DASEN, MD;  Location: [facility] END[facility]COPY;  Service: Endoscopy;  Laterality: N/A;   CORNEAL TRANSPLANT     ESOPHAGOGASTRODUODENOSCOPY N/A 04/09/2021   Procedure: ESOPHAGOGASTRODUODENOSCOPY  (EGD);  Surgeon: Unk Corinn Skiff, MD;  Location: [facility] ENDOSCOPY;  Service: Gastroenterology;  Laterality: N/A;   ESOPHAGOGASTRODUODENOSCOPY (EGD) WITH PROPOFOL  N/A 01/04/2017   Procedure: ESOPHAGOGASTRODUODENOSCOPY (EGD) WITH PROPOFOL ;  Surgeon: Viktoria Lamar DASEN, MD;  Location: [facility] ENDOSCOPY;  Service: Endoscopy;  Laterality: N/A;   EXCISION BONE CYST Left 07/31/2016   Procedure: EXCISION BONE CYST/exostectomy 28124/left 2nd;  Surgeon: Neill Boas, DPM;  Location: [facility];  Service: Podiatry;  Laterality: Left;   EXTRACORPOREAL SHOCK WAVE LITHOTRIPSY Left 09/12/2015   Procedure: EXTRACORPOREAL SHOCK WAVE LITHOTRIPSY (ESWL);  Surgeon: Rosina Riis, MD;  Location: [facility];  Service: Urology;  Laterality: Left;   FRACTURE SURGERY     left foot   GUM SURGERY Left    gum infection   HARDWARE REMOVAL Left 11/14/2020   Procedure: HARDWARE REMOVAL;  Surgeon: Kathlynn Sharper, MD;  Location: [facility];  Service: Orthopedics;  Laterality: Left;   HH repair     Fundoplication   JOINT REPLACEMENT Bilateral 2013,2014   total knees   LAPAROSCOPIC HYSTERECTOMY     LITHOTRIPSY     periprosthetic supracondylar fracture of left femur  02/16/2020   [facility]   POSTERIOR CERVICAL FUSION/FORAMINOTOMY N/A 03/02/2023   Procedure: C3-6 POSTERIOR CERVICAL LAMINECTOMY AND FUSION;  Surgeon: Claudene Riis ORN, MD;  Location: [facility] OR[facility];  Service: Neurosurgery;  Laterality: N/A;   TONSILLECTOMY     TOTAL KNEE REVISION Right 11/14/2019   Procedure: Revision patella and tibial polyethylene;  Surgeon: Kathlynn Sharper, MD;  Location: [facility];  Service: Orthopedics;  Laterality: Right;   URETEROSCOPY      Medical History: Past Medical History:  Diagnosis Date   Acid reflux    Anemia    Anxiety    a.) buspirone  BID + BZO PRN (clonazepam )   Arthritis    Asthma    Cervical disc disorder with radiculopathy of cervical region    Chronic pain syndrome    a.) on COT as prescribed  by pain mangement   Chronic,  continuous use of opioids    a.) followed by pain mangement   COPD (chronic obstructive pulmonary disease) (HCC)    DOE (dyspnea on exertion)    Hematuria    Hiatal hernia    History of kidney stones    Hypertension    Hypoglycemia    Insomnia    a.) uses Trazodone  PRN   LBBB (left bundle branch block)    Migraine    Murmur    Osteopenia    Palpitations    Pre-diabetes    Restless leg    a.) on ropinirole    Stenosis of cervical spine with myelopathy (HCC)    T2DM (type 2 diabetes mellitus) (HCC)     Family History: Family History  Problem Relation Age of Onset   Hypertension Mother    Stroke Mother    Prostate cancer Father    Kidney Stones Father    Diabetes Brother    Hypertension Brother    Breast cancer Maternal Aunt    Kidney disease Neg Hx     Social History   Socioeconomic History   Marital status: Widowed    Spouse name: Not on file   Number of children: Not on file   Years of education: Not on file   Highest education level: Not on file  Occupational History   Not on file  Tobacco Use   Smoking status: Never    Passive exposure: Past   Smokeless tobacco: Never  Vaping Use   Vaping status: Never Used  Substance and Sexual Activity   Alcohol use: No   Drug use: No   Sexual activity: Not Currently  Other Topics Concern   Not on file  Social History Narrative   Lives at home alone. Relatives are the support person.    Social Drivers of Health   Financial Resource Strain: Medium Risk (05/27/2023)   Received from [facility]   Overall Financial Resource Strain (CARDIA)    Difficulty of Paying Living Expenses: Somewhat hard  Food Insecurity: Food Insecurity Present (05/27/2023)   Received from [facility]   Hunger Vital Sign    Within the past 12 months, you worried that your food would run out before you got the money to buy more.: Never true    Within the past 12 months, the food you bought just didn't last and  you didn't have money to get more.: Sometimes true  Transportation Needs: No Transportation Needs (05/27/2023)   Received from [facility] - Transportation    In the past 12 months, has lack of transportation kept you from medical appointments or from getting medications?: No    Lack of Transportation (Non-Medical): No  Physical Activity: Not on file  Stress: Not on file  Social Connections: Moderately Isolated (04/16/2023)   Social Connection and Isolation Panel    Frequency of Communication with Friends and Family: More than three times a week    Frequency of Social Gatherings with Friends and Family: Once a week    Attends Religious Services: More than 4 times per year    Active Member of [employer] or Organizations: No    Attends Banker Meetings: Never    Marital Status: Widowed  Intimate Partner Violence: Not At Risk (04/16/2023)   Humiliation, Afraid, Rape, and Kick questionnaire    Fear of Current or Ex-Partner: No    Emotionally Abused: No    Physically  Abused: No    Sexually Abused: No      Review of Systems  Constitutional:  Negative for activity change, appetite change, chills, fatigue, fever and unexpected weight change.  HENT:  Positive for dental problem. Negative for congestion, ear pain, mouth sores, rhinorrhea, sore throat and trouble swallowing.   Eyes: Negative.   Respiratory: Negative.  Negative for cough, chest tightness, shortness of breath and wheezing.   Cardiovascular: Negative.  Negative for chest pain and palpitations.  Gastrointestinal: Negative.  Negative for abdominal pain, blood in stool, constipation, diarrhea, nausea and vomiting.  Endocrine: Negative.   Genitourinary: Negative.  Negative for difficulty urinating, dysuria, frequency, hematuria and urgency.  Musculoskeletal: Negative.  Negative for arthralgias, back pain, joint swelling, myalgias and neck pain.  Skin: Negative.  Negative for rash and wound.   Allergic/Immunologic: Negative.  Negative for immunocompromised state.  Neurological: Negative.  Negative for dizziness, seizures, numbness and headaches.  Hematological: Negative.   Psychiatric/Behavioral: Negative.  Negative for behavioral problems, self-injury and suicidal ideas. The patient is not nervous/anxious.     Vital Signs: BP (!) 113/58   Pulse 88   Temp 98.3 F (36.8 C)   Resp 16   Ht 4' 11.5 (1.511 m)   Wt 117 lb 6.4 oz (53.3 kg)   SpO2 96%   BMI 23.32 kg/m    Physical Exam Vitals reviewed.  Constitutional:      General: She is awake. She is not in acute distress.    Appearance: Normal appearance. She is well-developed, well-groomed and overweight. She is not ill-appearing or diaphoretic.  HENT:     Head: Normocephalic and atraumatic.     Right Ear: Tympanic membrane, ear canal and external ear normal.     Left Ear: Tympanic membrane, ear canal and external ear normal.     Nose: Nose normal. No congestion or rhinorrhea.     Mouth/Throat:     Lips: Pink.     Mouth: Mucous membranes are moist. Oral lesions present.     Dentition: Abnormal dentition. Dental tenderness and gingival swelling present.     Pharynx: Oropharynx is clear. Uvula midline. No oropharyngeal exudate or posterior oropharyngeal erythema.  Eyes:     General: Lids are normal. Vision grossly intact. Gaze aligned appropriately. No scleral icterus.       Right eye: No discharge.        Left eye: No discharge.     Extraocular Movements: Extraocular movements intact.     Conjunctiva/sclera: Conjunctivae normal.     Pupils: Pupils are equal, round, and reactive to light.     Funduscopic exam:    Right eye: Red reflex present.        Left eye: Red reflex present. Neck:     Thyroid : No thyromegaly.     Vascular: No carotid bruit or JVD.     Trachea: Trachea and phonation normal. No tracheal deviation.  Cardiovascular:     Rate and Rhythm: Normal rate and regular rhythm.     Pulses: Normal  pulses.     Heart sounds: Normal heart sounds, S1 normal and S2 normal. No murmur heard.    No friction rub. No gallop.  Pulmonary:     Effort: Pulmonary effort is normal. No accessory muscle usage or respiratory distress.     Breath sounds: Normal breath sounds and air entry. No stridor. No wheezing or rales.  Chest:     Chest wall: No tenderness.  Breasts:    Breasts are symmetrical.  Right: Normal.     Left: Normal.  Abdominal:     General: Bowel sounds are normal. There is no distension.     Palpations: Abdomen is soft. There is no shifting dullness, fluid wave, mass or pulsatile mass.     Tenderness: There is no abdominal tenderness. There is no guarding or rebound.  Musculoskeletal:        General: No tenderness or deformity. Normal range of motion.     Cervical back: Normal range of motion and neck supple.     Right lower leg: 1+ Edema present.     Left lower leg: 1+ Edema present.  Lymphadenopathy:     Cervical: No cervical adenopathy.     Upper Body:     Right upper body: No supraclavicular, axillary or pectoral adenopathy.     Left upper body: No supraclavicular, axillary or pectoral adenopathy.  Skin:    General: Skin is warm and dry.     Capillary Refill: Capillary refill takes less than 2 seconds.     Coloration: Skin is not pale.     Findings: No erythema or rash.  Neurological:     Mental Status: She is alert and oriented to person, place, and time.     Cranial Nerves: No cranial nerve deficit.     Motor: No abnormal muscle tone.     Coordination: Coordination normal.     Deep Tendon Reflexes: Reflexes are normal and symmetric.  Psychiatric:        Mood and Affect: Mood normal.        Behavior: Behavior normal. Behavior is cooperative.        Thought Content: Thought content normal.        Judgment: Judgment normal.        Assessment/Plan: 1. Encounter for subsequent annual wellness visit (AWV) in Medicare patient (Primary) Age-appropriate  preventive screenings and vaccinations discussed. Routine labs for health maintenance will be ordered but are mostly up to date. PHM updated.    2. Type 2 diabetes mellitus with other specified complication, without long-term current use of insulin  (HCC) Well controled, diet-controlled diabetes. Her highest A1c was 6.8 in February last year. Her most recent A1c was 5.8 in January this year. Her current glucose meter is not working right and is complicated to use. She would like to get a new one that works better and is easier to use. Repeat lab ordered  - Blood Glucose Monitoring Suppl (ACCU-CHEK GUIDE ME) w/Device KIT; Use device to check glucose daily and as need for prediabetes R73.03  Dispense: 1 kit; Refill: 0 - Accu-Chek Softclix Lancets lancets; Use 1 lancet to check glucose once daily and as needed for prediabetes R73.03  Dispense: 100 each; Refill: 12 - glucose blood (ACCU-CHEK GUIDE TEST) test strip; Use 1 test strip to check glucose once daily and as needed for prediabetes R73.03  Dispense: 100 each; Refill: 12 - Basic Metabolic Panel (BMET)  3. Hypokalemia Repeat BMP ordered. Restart daily potassium supplement as prescribed.  - potassium chloride  (KLOR-CON  10) 10 MEQ tablet; Take 1 tablet (10 mEq total) by mouth daily.  Dispense: 90 tablet; Refill: 1 - Basic Metabolic Panel (BMET)  4. Moderate persistent asthma without complication Continue following up with pulmonology and continue dupixent  injections as prescribed and inhalers as prescribed.   5. Cervical radicular pain (left severe) Continue to follow up with neurosurgery.   6. Seasonal allergic rhinitis due to pollen Continue medications as prescribed.   7. Encounter for screening mammogram  for malignant neoplasm of breast Routine mammogram ordered for [facility]  imaging  - MM 3D SCREENING MAMMOGRAM BILATERAL BREAST; Future  8. Normal weight and normal BMI. -- patient instructed that she does not have to go back to the  nutritionist anymore and encouraged to continue the diet that was recommended by the nutritionist. She does drink nutritional shakes or protein shakes sometimes but she is also eating a well balanced nutrient-filled diet. She does not lose any substantial amount of weight but gaining anymore weight is difficult due to recurrent infection in her cervical spine and side effects of the antibiotics she has been prescribed.    General Counseling: Jamison verbalizes understanding of the findings of todays visit and agrees with plan of treatment. I have discussed any further diagnostic evaluation that may be needed or ordered today. We also reviewed her medications today. she has been encouraged to call the office with any questions or concerns that should arise related to todays visit.    Orders Placed This Encounter  Procedures   MM 3D SCREENING MAMMOGRAM BILATERAL BREAST   Basic Metabolic Panel (BMET)    Meds ordered this encounter  Medications   potassium chloride  (KLOR-CON  10) 10 MEQ tablet    Sig: Take 1 tablet (10 mEq total) by mouth daily.    Dispense:  90 tablet    Refill:  1    Fill new script today   Blood Glucose Monitoring Suppl (ACCU-CHEK GUIDE ME) w/Device KIT    Sig: Use device to check glucose daily and as need for prediabetes R73.03    Dispense:  1 kit    Refill:  0    Dx code R73.03   Accu-Chek Softclix Lancets lancets    Sig: Use 1 lancet to check glucose once daily and as needed for prediabetes R73.03    Dispense:  100 each    Refill:  12    Dx code R73.03   glucose blood (ACCU-CHEK GUIDE TEST) test strip    Sig: Use 1 test strip to check glucose once daily and as needed for prediabetes R73.03    Dispense:  100 each    Refill:  12    Dx code R73.03    Return in about 3 months (around 01/29/2024) for F/U, Kristelle Cavallaro PCP.   Total time spent:30 Minutes Time spent includes review of chart, medications, test results, and follow up plan with the patient.    Controlled  Substance Database was reviewed by me.  This patient was seen by Mardy Maxin, FNP-C in collaboration with Dr. Sigrid Bathe as a part of collaborative care agreement.  Jacson Rapaport R. Maxin, MSN, FNP-C Internal medicine

## 2023-11-01 ENCOUNTER — Telehealth: Payer: Self-pay | Admitting: *Deleted

## 2023-11-01 NOTE — Telephone Encounter (Signed)
 Called patient to inform her that we have received clearance from  Alyssa Abernathy to stop Eliquis  3 days for CESI.  Appt is 11-03-23, she has already stopped taking Eliquis .

## 2023-11-03 ENCOUNTER — Ambulatory Visit (HOSPITAL_BASED_OUTPATIENT_CLINIC_OR_DEPARTMENT_OTHER): Admitting: Student in an Organized Health Care Education/Training Program

## 2023-11-03 ENCOUNTER — Encounter: Payer: Self-pay | Admitting: Student in an Organized Health Care Education/Training Program

## 2023-11-03 ENCOUNTER — Ambulatory Visit
Admission: RE | Admit: 2023-11-03 | Discharge: 2023-11-03 | Disposition: A | Discharge status: Home / Self Care | Source: Ambulatory Visit | Attending: Student in an Organized Health Care Education/Training Program | Admitting: Student in an Organized Health Care Education/Training Program

## 2023-11-03 ENCOUNTER — Other Ambulatory Visit: Payer: Self-pay

## 2023-11-03 VITALS — BP 98/69 | HR 65 | Temp 97.4°F | Resp 16 | Ht 59.5 in | Wt 117.0 lb

## 2023-11-03 DIAGNOSIS — M5412 Radiculopathy, cervical region: Secondary | ICD-10-CM | POA: Insufficient documentation

## 2023-11-03 DIAGNOSIS — M4802 Spinal stenosis, cervical region: Secondary | ICD-10-CM | POA: Diagnosis present

## 2023-11-03 DIAGNOSIS — M5481 Occipital neuralgia: Secondary | ICD-10-CM | POA: Insufficient documentation

## 2023-11-03 MED ORDER — SODIUM CHLORIDE 0.9% FLUSH
1.0000 mL | Freq: Once | INTRAVENOUS | Status: AC
  Administered 2023-11-03 (×2): 1 mL

## 2023-11-03 MED ORDER — DEXAMETHASONE SODIUM PHOSPHATE 10 MG/ML IJ SOLN
10.0000 mg | Freq: Once | INTRAMUSCULAR | Status: AC
  Administered 2023-11-03 (×2): 10 mg
  Filled 2023-11-03: qty 1

## 2023-11-03 MED ORDER — LIDOCAINE HCL 2 % IJ SOLN
20.0000 mL | Freq: Once | INTRAMUSCULAR | Status: AC
  Administered 2023-11-03 (×2): 400 mg
  Filled 2023-11-03: qty 20

## 2023-11-03 MED ORDER — LACTATED RINGERS IV SOLN: Freq: Once | INTRAVENOUS | Status: DC

## 2023-11-03 MED ORDER — MIDAZOLAM HCL 2 MG/2ML IJ SOLN
0.5000 mg | Freq: Once | INTRAMUSCULAR | Status: AC
  Administered 2023-11-03 (×2): 2 mg via INTRAVENOUS
  Filled 2023-11-03: qty 2

## 2023-11-03 MED ORDER — IOHEXOL 180 MG/ML  SOLN
10.0000 mL | Freq: Once | INTRAMUSCULAR | Status: AC
  Administered 2023-11-03 (×2): 10 mL via EPIDURAL
  Filled 2023-11-03: qty 20

## 2023-11-03 MED ORDER — ROPIVACAINE HCL 2 MG/ML IJ SOLN
1.0000 mL | Freq: Once | INTRAMUSCULAR | Status: AC
  Administered 2023-11-03 (×2): 1 mL via EPIDURAL
  Filled 2023-11-03: qty 20

## 2023-11-03 NOTE — Progress Notes (Signed)
 PROVIDER NOTE: Interpretation of information contained herein should be left to medically-trained personnel. Specific patient instructions are provided elsewhere under Patient Instructions section of medical record. This document was created in part using STT-dictation technology, any transcriptional errors that may result from this process are unintentional.  Patient: Summer Murphy Type: Established DOB: 27-Aug-1955 MRN: [id number] PCP: Liana Fish, NP  Service: Procedure DOS: 11/03/2023 Setting: Ambulatory Location: Ambulatory outpatient facility Delivery: Face-to-face Provider: Wallie Sherry, MD Specialty: Interventional Pain Management Specialty designation: 09 Location: Outpatient facility Ref. Prov.: Sherry Wallie, MD       Interventional Therapy   Type: Cervical Epidural Steroid injection (CESI) (Interlaminar) #1  Laterality: Midline  Level: C7-T1 DOS: 11/03/2023  Provider: Wallie Sherry, MD Imaging: Fluoroscopy-guided Spinal ([id number]) Anesthesia: Local anesthesia (1-2% Lidocaine ) Sedation: Minimal Sedation                        Medical Necessity Purpose: Diagnostic/Therapeutic Rationale (medical necessity): procedure needed and proper for the diagnosis and/or treatment of Summer Murphy medical symptoms and needs. Indications: Cervicalgia, cervical radicular pain, degenerative disc disease, severe enough to impact quality of life or function. 1. Cervical radicular pain (left severe)   2. Foraminal stenosis of cervical region (severe b/l C5/6; C6/7)   3. Bilateral occipital neuralgia    NAS-11 Pain score:   Pre-procedure: 9 /10   Post-procedure: 0-No pain/10     Position  Prep  Materials:  Location setting: Procedure suite Position: Prone, on modified reverse trendelenburg to facilitate breathing, with head in head-cradle. Pillows positioned under chest (below chin-level) with cervical spine flexed. Safety Precautions: Patient was assessed for positional  comfort and pressure points before starting the procedure. Prepping solution: DuraPrep (Iodine Povacrylex [0.7% available iodine] and Isopropyl Alcohol, 74% w/w) Prep Area: Entire  cervicothoracic region Approach: percutaneous, paramedial Intended target: Posterior cervical epidural space Materials Procedure:  Tray: Epidural Needle(s): Epidural (Tuohy) Qty: 1 Length: (90mm) 3.5-inch Gauge: 22G  H&P (Pre-op Assessment):  Summer Murphy is a 68 y.o. (year old), female patient, seen today for interventional treatment. She  has a past surgical history that includes Ureteroscopy; Laparoscopic hysterectomy; Lithotripsy; HH repair; Cardiac electrophysiology study and ablation; Appendectomy (1990); Cholecystectomy (1990); Joint replacement (Bilateral, [id number]); Extracorporeal shock wave lithotripsy (Left, 09/12/2015); Tonsillectomy; Corneal transplant; Abdominal hysterectomy; Breast surgery; Fracture surgery; Amputation toe (Left, 07/31/2016); Excision bone cyst (Left, 07/31/2016); Colonoscopy with propofol  (N/A, 01/04/2017); Esophagogastroduodenoscopy (egd) with propofol  (N/A, 01/04/2017); Back surgery; Total knee revision (Right, 11/14/2019); periprosthetic supracondylar fracture of left femur (02/16/2020); Hardware Removal (Left, 11/14/2020); Esophagogastroduodenoscopy (N/A, 04/09/2021); Gum surgery (Left); Posterior cervical fusion/foraminotomy (N/A, 03/02/2023); and Cervical wound debridement (N/A, 04/17/2023). Summer Murphy has a current medication list which includes the following prescription(s): accu-chek softclix lancets, acetaminophen , alendronate , accu-chek guide me, buspirone , cefadroxil , clonazepam , combivent  respimat, diclofenac  sodium, duloxetine , dupixent , eliquis , fluticasone -salmeterol, gabapentin , accu-chek guide test, hydrocodone -acetaminophen , ipratropium-albuterol , lidocaine , methocarbamol , montelukast , ondansetron , oxybutynin , pantoprazole , polyethylene glycol, potassium chloride ,  ropinirole , sumatriptan , trazodone , and methylprednisolone , and the following Facility-Administered Medications: lactated ringers . Her primarily concern today is the Neck Pain (Left is worse  ) and Headache (Posterior )  Initial Vital Signs:  Pulse/HCG Rate: 65ECG Heart Rate: 100 Temp: (!) 97.4 F (36.3 C) Resp: 16 BP: 118/80 SpO2: 99 %  BMI: Estimated body mass index is 23.24 kg/m as calculated from the following:   Height as of this encounter: 4' 11.5 (1.511 m).   Weight as of this encounter: 117 lb (53.1 kg).  Risk Assessment: Allergies: Reviewed. She is allergic to librium [  chlordiazepoxide], reglan [metoclopramide], vanilla, aspirin, nsaids, azithromycin , buprenorphine hcl, flexeril  [cyclobenzaprine ], morphine, sulfa antibiotics, tolmetin, amlodipine  besylate, and iron .  Allergy Precautions: None required Coagulopathies: Reviewed. None identified.  Blood-thinner therapy: None at this time Active Infection(s): Reviewed. None identified. Summer Murphy is afebrile  Site Confirmation: Summer Murphy was asked to confirm the procedure and laterality before marking the site Procedure checklist: Completed Consent: Before the procedure and under the influence of no sedative(s), amnesic(s), or anxiolytics, the patient was informed of the treatment options, risks and possible complications. To fulfill our ethical and legal obligations, as recommended by the American Medical Association's Code of Ethics, I have informed the patient of my clinical impression; the nature and purpose of the treatment or procedure; the risks, benefits, and possible complications of the intervention; the alternatives, including doing nothing; the risk(s) and benefit(s) of the alternative treatment(s) or procedure(s); and the risk(s) and benefit(s) of doing nothing. The patient was provided information about the general risks and possible complications associated with the procedure. These may include, but are not limited  to: failure to achieve desired goals, infection, bleeding, organ or nerve damage, allergic reactions, paralysis, and death. In addition, the patient was informed of those risks and complications associated to Spine-related procedures, such as failure to decrease pain; infection (i.e.: Meningitis, epidural or intraspinal abscess); bleeding (i.e.: epidural hematoma, subarachnoid hemorrhage, or any other type of intraspinal or peri-dural bleeding); organ or nerve damage (i.e.: Any type of peripheral nerve, nerve root, or spinal cord injury) with subsequent damage to sensory, motor, and/or autonomic systems, resulting in permanent pain, numbness, and/or weakness of one or several areas of the body; allergic reactions; (i.e.: anaphylactic reaction); and/or death. Furthermore, the patient was informed of those risks and complications associated with the medications. These include, but are not limited to: allergic reactions (i.e.: anaphylactic or anaphylactoid reaction(s)); adrenal axis suppression; blood sugar elevation that in diabetics may result in ketoacidosis or comma; water  retention that in patients with history of congestive heart failure may result in shortness of breath, pulmonary edema, and decompensation with resultant heart failure; weight gain; swelling or edema; medication-induced neural toxicity; particulate matter embolism and blood vessel occlusion with resultant organ, and/or nervous system infarction; and/or aseptic necrosis of one or more joints. Finally, the patient was informed that Medicine is not an exact science; therefore, there is also the possibility of unforeseen or unpredictable risks and/or possible complications that may result in a catastrophic outcome. The patient indicated having understood very clearly. We have given the patient no guarantees and we have made no promises. Enough time was given to the patient to ask questions, all of which were answered to the patient's satisfaction.  Ms. Siddique has indicated that she wanted to continue with the procedure. Attestation: I, the ordering provider, attest that I have discussed with the patient the benefits, risks, side-effects, alternatives, likelihood of achieving goals, and potential problems during recovery for the procedure that I have provided informed consent. Date  Time: 11/03/2023 10:50 AM  Pre-Procedure Preparation:  Monitoring: As per clinic protocol. Respiration, ETCO2, SpO2, BP, heart rate and rhythm monitor placed and checked for adequate function Safety Precautions: Patient was assessed for positional comfort and pressure points before starting the procedure. Time-out: I initiated and conducted the Time-out before starting the procedure, as per protocol. The patient was asked to participate by confirming the accuracy of the Time Out information. Verification of the correct person, site, and procedure were performed and confirmed by me, the nursing staff, and the  patient. Time-out conducted as per Joint Commission's Universal Protocol (UP.01.01.01). Time: 1135 Start Time: 1135 hrs.  Description  Narrative of Procedure:          Rationale (medical necessity): procedure needed and proper for the diagnosis and/or treatment of the patient's medical symptoms and needs. Start Time: 1135 hrs. Safety Precautions: Aspiration looking for blood return was conducted prior to all injections. At no point did we inject any substances, as a needle was being advanced. No attempts were made at seeking any paresthesias. Safe injection practices and needle disposal techniques used. Medications properly checked for expiration dates. SDV (single dose vial) medications used. Description of procedure: Protocol guidelines were followed. The patient was assisted into a comfortable position. The target area was identified and the area prepped in the usual manner. Skin & deeper tissues infiltrated with local anesthetic. Appropriate amount of  time allowed to pass for local anesthetics to take effect. Using fluoroscopic guidance, the epidural needle was introduced through the skin, ipsilateral to the reported pain, and advanced to the target area. Posterior laminar os was contacted and the needle walked caudad, until the lamina was cleared. The ligamentum flavum was engaged and the epidural space identified using "loss-of-resistance technique" with 2-3 ml of PF-NaCl (0.9% NSS), in a 5cc dedicated LOR syringe. (See Imaging guidance below for use of contrast details.) Once proper needle placement was secured, and negative aspiration confirmed, the solution was injected in intermittent fashion, asking for systemic symptoms every 0.5cc. The needles were then removed and the area cleansed, making sure to leave some of the prepping solution back to take advantage of its long term bactericidal properties.  3 cc solution made of 1 cc of preservative-free saline, 1cc of 0.2% ropivacaine , 1 cc of Decadron  10 mg/cc.   Vitals:   11/03/23 1135 11/03/23 1140 11/03/23 1144 11/03/23 1150  BP: (!) 144/84 (!) 140/75 (!) 141/73 98/69  Pulse:      Resp: 15 16 16    Temp:      TempSrc:      SpO2: 98% 99% 98% 98%  Weight:      Height:         End Time: 1142 hrs.  Imaging Guidance (Spinal):          Type of Imaging Technique: Fluoroscopy Guidance (Spinal) Indication(s): Fluoroscopy guidance for needle placement to enhance accuracy in procedures requiring precise needle localization for targeted delivery of medication in or near specific anatomical locations not easily accessible without such real-time imaging assistance. Exposure Time: Please see nurses notes. Contrast: Before injecting any contrast, we confirmed that the patient did not have an allergy to iodine, shellfish, or radiological contrast. Once satisfactory needle placement was completed at the desired level, radiological contrast was injected. Contrast injected under live fluoroscopy. No  contrast complications. See chart for type and volume of contrast used. Fluoroscopic Guidance: I was personally present during the use of fluoroscopy. Tunnel Vision Technique used to obtain the best possible view of the target area. Parallax error corrected before commencing the procedure. Direction-depth-direction technique used to introduce the needle under continuous pulsed fluoroscopy. Once target was reached, antero-posterior, oblique, and lateral fluoroscopic projection used confirm needle placement in all planes. Images permanently stored in EMR. Interpretation: I personally interpreted the imaging intraoperatively. Adequate needle placement confirmed in multiple planes. Appropriate spread of contrast into desired area was observed. No evidence of afferent or efferent intravascular uptake. No intrathecal or subarachnoid spread observed. Permanent images saved into the patient's record.  Post-operative Assessment:  Post-procedure Vital Signs:  Pulse/HCG Rate: 6593 Temp: (!) 97.4 F (36.3 C) Resp: 16 BP: 98/69 SpO2: 98 %  EBL: None  Complications: No immediate post-treatment complications observed by team, or reported by patient.  Note: The patient tolerated the entire procedure well. A repeat set of vitals were taken after the procedure and the patient was kept under observation following institutional policy, for this type of procedure. Post-procedural neurological assessment was performed, showing return to baseline, prior to discharge. The patient was provided with post-procedure discharge instructions, including a section on how to identify potential problems. Should any problems arise concerning this procedure, the patient was given instructions to immediately contact us , at any time, without hesitation. In any case, we plan to contact the patient by telephone for a follow-up status report regarding this interventional procedure.  Comments:  No additional relevant  information.  Plan of Care (POC)  Orders:  Orders Placed This Encounter  Procedures   [facility] C-ARM 1-60 MIN NO REPORT    Intraoperative interpretation by procedural physician at [facility].    Standing Status:   Standing    Number of Occurrences:   1    Reason for exam::   Assistance in needle guidance and placement for procedures requiring needle placement in or near specific anatomical locations not easily accessible without such assistance.     Medications ordered for procedure: Meds ordered this encounter  Medications   iohexol  (OMNIPAQUE ) 180 MG/ML injection 10 mL    Must be Myelogram-compatible. If not available, you may substitute with a water -soluble, non-ionic, hypoallergenic, myelogram-compatible radiological contrast medium.   lidocaine  (XYLOCAINE ) 2 % (with pres) injection 400 mg   lactated ringers  infusion   midazolam  (VERSED ) injection 0.5-2 mg    Make sure Flumazenil is available in the pyxis when using this medication. If oversedation occurs, administer 0.2 mg IV over 15 sec. If after 45 sec no response, administer 0.2 mg again over 1 min; may repeat at 1 min intervals; not to exceed 4 doses (1 mg)   ropivacaine  (PF) 2 mg/mL (0.2%) (NAROPIN ) injection 1 mL   sodium chloride  flush (NS) 0.9 % injection 1 mL   dexamethasone  (DECADRON ) injection 10 mg   dexamethasone  (DECADRON ) injection 10 mg   Medications administered: We administered iohexol , lidocaine , midazolam , ropivacaine  (PF) 2 mg/mL (0.2%), sodium chloride  flush, dexamethasone , and dexamethasone .  See the medical record for exact dosing, route, and time of administration.    Left cervical ESI, bilateral occipital nerve block 11/03/2023    Follow-up plan:   Return in about 4 weeks (around 12/01/2023) for PPE, F2F.     Recent Visits Date Type Provider Dept  10/26/23 Office Visit Marcelino Nurse, MD [facility]  Showing recent visits within past 90 days and meeting all other  requirements Today's Visits Date Type Provider Dept  11/03/23 Procedure visit Marcelino Nurse, MD [facility]  Showing today's visits and meeting all other requirements Future Appointments Date Type Provider Dept  12/07/23 Appointment Marcelino Nurse, MD [facility]  Showing future appointments within next 90 days and meeting all other requirements   Disposition: Discharge home  Discharge (Date  Time): 11/03/2023; 1158 hrs.   Primary Care Physician: Liana Fish, NP Location: [facility] Note by: Nurse Marcelino, MD (TTS technology used. I apologize for any typographical errors that were not detected and corrected.) Date: 11/03/2023; Time: 12:05 PM  Disclaimer:  Medicine is not an Visual merchandiser. The only guarantee in medicine  is that nothing is guaranteed. It is important to note that the decision to proceed with this intervention was based on the information collected from the patient. The Data and conclusions were drawn from the patient's questionnaire, the interview, and the physical examination. Because the information was provided in large part by the patient, it cannot be guaranteed that it has not been purposely or unconsciously manipulated. Every effort has been made to obtain as much relevant data as possible for this evaluation. It is important to note that the conclusions that lead to this procedure are derived in large part from the available data. Always take into account that the treatment will also be dependent on availability of resources and existing treatment guidelines, considered by other Pain Management Practitioners as being common knowledge and practice, at the time of the intervention. For Medico-Legal purposes, it is also important to point out that variation in procedural techniques and pharmacological choices are the acceptable norm. The indications, contraindications, technique, and results of the above procedure should  only be interpreted and judged by a Board-Certified Interventional Pain Specialist with extensive familiarity and expertise in the same exact procedure and technique.

## 2023-11-03 NOTE — Progress Notes (Signed)
 Safety precautions to be maintained throughout the outpatient stay will include: orient to surroundings, keep bed in low position, maintain call bell within reach at all times, provide assistance with transfer out of bed and ambulation.

## 2023-11-03 NOTE — Progress Notes (Signed)
 PROVIDER NOTE: Interpretation of information contained herein should be left to medically-trained personnel. Specific patient instructions are provided elsewhere under Patient Instructions section of medical record. This document was created in part using STT-dictation technology, any transcriptional errors that may result from this process are unintentional.  Patient: Summer Murphy Type: Established DOB: 06-06-1955 MRN: [id number] PCP: Liana Fish, NP  Service: Procedure DOS: 11/03/2023 Setting: Ambulatory Location: Ambulatory outpatient facility Delivery: Face-to-face Provider: Wallie Sherry, MD Specialty: Interventional Pain Management Specialty designation: 09 Location: Outpatient facility Ref. Prov.: Sherry Wallie, MD       Interventional Therapy   Primary Reason for Visit: Interventional Pain Management Treatment. CC: Neck Pain (Left is worse  ) and Headache (Posterior )    Procedure:          Anesthesia, Analgesia, Anxiolysis:  Type: Diagnostic, Greater, Occipital Nerve Block  #1  Region: Posterolateral Cervical Level: Occipital Ridge   Laterality: Bilateral  Anesthesia: Local (1-2% Lidocaine )  Anxiolysis: IV Versed       Position: Prone   Bilateral occipital neuralgia NAS-11 Pain score:   Pre-procedure: 9 /10   Post-procedure: 0-No pain/10     H&P (Pre-op Assessment):  Summer Murphy is a 68 y.o. (year old), female patient, seen today for interventional treatment. She  has a past surgical history that includes Ureteroscopy; Laparoscopic hysterectomy; Lithotripsy; HH repair; Cardiac electrophysiology study and ablation; Appendectomy (1990); Cholecystectomy (1990); Joint replacement (Bilateral, [id number]); Extracorporeal shock wave lithotripsy (Left, 09/12/2015); Tonsillectomy; Corneal transplant; Abdominal hysterectomy; Breast surgery; Fracture surgery; Amputation toe (Left, 07/31/2016); Excision bone cyst (Left, 07/31/2016); Colonoscopy with propofol  (N/A,  01/04/2017); Esophagogastroduodenoscopy (egd) with propofol  (N/A, 01/04/2017); Back surgery; Total knee revision (Right, 11/14/2019); periprosthetic supracondylar fracture of left femur (02/16/2020); Hardware Removal (Left, 11/14/2020); Esophagogastroduodenoscopy (N/A, 04/09/2021); Gum surgery (Left); Posterior cervical fusion/foraminotomy (N/A, 03/02/2023); and Cervical wound debridement (N/A, 04/17/2023). Summer Murphy has a current medication list which includes the following prescription(s): accu-chek softclix lancets, acetaminophen , alendronate , accu-chek guide me, buspirone , cefadroxil , clonazepam , combivent  respimat, diclofenac  sodium, duloxetine , dupixent , eliquis , fluticasone -salmeterol, gabapentin , accu-chek guide test, hydrocodone -acetaminophen , ipratropium-albuterol , lidocaine , methocarbamol , montelukast , ondansetron , oxybutynin , pantoprazole , polyethylene glycol, potassium chloride , ropinirole , sumatriptan , trazodone , and methylprednisolone , and the following Facility-Administered Medications: lactated ringers . Her primarily concern today is the Neck Pain (Left is worse  ) and Headache (Posterior )  Initial Vital Signs:  Pulse/HCG Rate: 65ECG Heart Rate: 100 Temp: (!) 97.4 F (36.3 C) Resp: 16 BP: 118/80 SpO2: 99 %  BMI: Estimated body mass index is 23.24 kg/m as calculated from the following:   Height as of this encounter: 4' 11.5 (1.511 m).   Weight as of this encounter: 117 lb (53.1 kg).  Risk Assessment: Allergies: Reviewed. She is allergic to librium [chlordiazepoxide], reglan [metoclopramide], vanilla, aspirin, nsaids, azithromycin , buprenorphine hcl, flexeril  [cyclobenzaprine ], morphine, sulfa antibiotics, tolmetin, amlodipine  besylate, and iron .  Allergy Precautions: None required Coagulopathies: Reviewed. None identified.  Blood-thinner therapy: None at this time Active Infection(s): Reviewed. None identified. Summer Murphy is afebrile  Site Confirmation: Summer Murphy was  asked to confirm the procedure and laterality before marking the site Procedure checklist: Completed Consent: Before the procedure and under the influence of no sedative(s), amnesic(s), or anxiolytics, the patient was informed of the treatment options, risks and possible complications. To fulfill our ethical and legal obligations, as recommended by the American Medical Association's Code of Ethics, I have informed the patient of my clinical impression; the nature and purpose of the treatment or procedure; the risks, benefits, and possible complications of the intervention; the alternatives, including doing  nothing; the risk(s) and benefit(s) of the alternative treatment(s) or procedure(s); and the risk(s) and benefit(s) of doing nothing. The patient was provided information about the general risks and possible complications associated with the procedure. These may include, but are not limited to: failure to achieve desired goals, infection, bleeding, organ or nerve damage, allergic reactions, paralysis, and death. In addition, the patient was informed of those risks and complications associated to the procedure, such as failure to decrease pain; infection; bleeding; organ or nerve damage with subsequent damage to sensory, motor, and/or autonomic systems, resulting in permanent pain, numbness, and/or weakness of one or several areas of the body; allergic reactions; (i.e.: anaphylactic reaction); and/or death. Furthermore, the patient was informed of those risks and complications associated with the medications. These include, but are not limited to: allergic reactions (i.e.: anaphylactic or anaphylactoid reaction(s)); adrenal axis suppression; blood sugar elevation that in diabetics may result in ketoacidosis or comma; water  retention that in patients with history of congestive heart failure may result in shortness of breath, pulmonary edema, and decompensation with resultant heart failure; weight gain; swelling  or edema; medication-induced neural toxicity; particulate matter embolism and blood vessel occlusion with resultant organ, and/or nervous system infarction; and/or aseptic necrosis of one or more joints. Finally, the patient was informed that Medicine is not an exact science; therefore, there is also the possibility of unforeseen or unpredictable risks and/or possible complications that may result in a catastrophic outcome. The patient indicated having understood very clearly. We have given the patient no guarantees and we have made no promises. Enough time was given to the patient to ask questions, all of which were answered to the patient's satisfaction. Ms. Vanderweele has indicated that she wanted to continue with the procedure. Attestation: I, the ordering provider, attest that I have discussed with the patient the benefits, risks, side-effects, alternatives, likelihood of achieving goals, and potential problems during recovery for the procedure that I have provided informed consent. Date  Time: 11/03/2023 10:50 AM  Pre-Procedure Preparation:  Monitoring: As per clinic protocol. Respiration, ETCO2, SpO2, BP, heart rate and rhythm monitor placed and checked for adequate function Safety Precautions: Patient was assessed for positional comfort and pressure points before starting the procedure. Time-out: I initiated and conducted the Time-out before starting the procedure, as per protocol. The patient was asked to participate by confirming the accuracy of the Time Out information. Verification of the correct person, site, and procedure were performed and confirmed by me, the nursing staff, and the patient. Time-out conducted as per Joint Commission's Universal Protocol (UP.01.01.01). Time: 1135 Start Time: 1135 hrs.  Description of Procedure:          Target Area: Area medial to the occipital artery at the level of the superior nuchal ridge Approach: Posterior approach Area Prepped: Entire  Posterior Occipital Region ChloraPrep (2% chlorhexidine  gluconate and 70% isopropyl alcohol) Safety Precautions: Aspiration looking for blood return was conducted prior to all injections. At no point did we inject any substances, as a needle was being advanced. No attempts were made at seeking any paresthesias. Safe injection practices and needle disposal techniques used. Medications properly checked for expiration dates. SDV (single dose vial) medications used. Description of the Procedure: Protocol guidelines were followed. The target area was identified and the area prepped in the usual manner. Skin & deeper tissues infiltrated with local anesthetic. Appropriate amount of time allowed to pass for local anesthetics to take effect. The procedure needles were then advanced to the target area. Proper  needle placement secured. Negative aspiration confirmed. Solution injected in intermittent fashion, asking for systemic symptoms every 0.5cc of injectate. The needles were then removed and the area cleansed, making sure to leave some of the prepping solution back to take advantage of its long term bactericidal properties.  Vitals:   11/03/23 1135 11/03/23 1140 11/03/23 1144 11/03/23 1150  BP: (!) 144/84 (!) 140/75 (!) 141/73 98/69  Pulse:      Resp: 15 16 16    Temp:      TempSrc:      SpO2: 98% 99% 98% 98%  Weight:      Height:        Start Time: 1135 hrs. End Time: 1142 hrs. Materials:  Needle(s) Type: Regular needle Gauge: 22G 1.5 inch  8 cc solution made of 7 cc of 0.2% ropivacaine , 1 cc of Decadron  10 mg/cc.  4 cc injected for the left greater occipital nerve, 4 cc injected for the right greater occipital nerve   Antibiotic Prophylaxis:   Anti-infectives (From admission, onward)    None      Indication(s): None identified  Post-operative Assessment:  Post-procedure Vital Signs:  Pulse/HCG Rate: 6593 Temp: (!) 97.4 F (36.3 C) Resp: 16 BP: 98/69 SpO2: 98 %  EBL:  None  Complications: No immediate post-treatment complications observed by team, or reported by patient.  Note: The patient tolerated the entire procedure well. A repeat set of vitals were taken after the procedure and the patient was kept under observation following institutional policy, for this type of procedure. Post-procedural neurological assessment was performed, showing return to baseline, prior to discharge. The patient was provided with post-procedure discharge instructions, including a section on how to identify potential problems. Should any problems arise concerning this procedure, the patient was given instructions to immediately contact us , at any time, without hesitation. In any case, we plan to contact the patient by telephone for a follow-up status report regarding this interventional procedure.  Comments:  No additional relevant information.  Plan of Care (POC)  Orders:  Orders Placed This Encounter  Procedures   DG PAIN CLINIC C-ARM 1-60 MIN NO REPORT    Intraoperative interpretation by procedural physician at [facility].    Standing Status:   Standing    Number of Occurrences:   1    Reason for exam::   Assistance in needle guidance and placement for procedures requiring needle placement in or near specific anatomical locations not easily accessible without such assistance.     Medications ordered for procedure: Meds ordered this encounter  Medications   iohexol  (OMNIPAQUE ) 180 MG/ML injection 10 mL    Must be Myelogram-compatible. If not available, you may substitute with a water -soluble, non-ionic, hypoallergenic, myelogram-compatible radiological contrast medium.   lidocaine  (XYLOCAINE ) 2 % (with pres) injection 400 mg   lactated ringers  infusion   midazolam  (VERSED ) injection 0.5-2 mg    Make sure Flumazenil is available in the pyxis when using this medication. If oversedation occurs, administer 0.2 mg IV over 15 sec. If after 45 sec no response,  administer 0.2 mg again over 1 min; may repeat at 1 min intervals; not to exceed 4 doses (1 mg)   ropivacaine  (PF) 2 mg/mL (0.2%) (NAROPIN ) injection 1 mL   sodium chloride  flush (NS) 0.9 % injection 1 mL   dexamethasone  (DECADRON ) injection 10 mg   dexamethasone  (DECADRON ) injection 10 mg   Medications administered: We administered iohexol , lidocaine , midazolam , ropivacaine  (PF) 2 mg/mL (0.2%), sodium chloride  flush, dexamethasone , and dexamethasone .  See the medical record for exact dosing, route, and time of administration.    Cervical ESI, bilateral occipital nerve block 11/03/2023    Follow-up plan:   Return in about 4 weeks (around 12/01/2023) for PPE, F2F.     Recent Visits Date Type Provider Dept  10/26/23 Office Visit Marcelino Nurse, MD [facility]  Showing recent visits within past 90 days and meeting all other requirements Today's Visits Date Type Provider Dept  11/03/23 Procedure visit Marcelino Nurse, MD [facility]  Showing today's visits and meeting all other requirements Future Appointments Date Type Provider Dept  12/07/23 Appointment Marcelino Nurse, MD [facility]  Showing future appointments within next 90 days and meeting all other requirements   Disposition: Discharge home  Discharge (Date  Time): 11/03/2023; 1158 hrs.   Primary Care Physician: Liana Fish, NP Location[facility] Note by: Nurse Marcelino, MD (TTS technology used. I apologize for any typographical errors that were not detected and corrected.) Date: 11/03/2023; Time: 12:07 PM  Disclaimer:  Medicine is not an Visual merchandiser. The only guarantee in medicine is that nothing is guaranteed. It is important to note that the decision to proceed with this intervention was based on the information collected from the patient. The Data and conclusions were drawn from the patient's questionnaire, the interview, and the physical examination. Because  the information was provided in large part by the patient, it cannot be guaranteed that it has not been purposely or unconsciously manipulated. Every effort has been made to obtain as much relevant data as possible for this evaluation. It is important to note that the conclusions that lead to this procedure are derived in large part from the available data. Always take into account that the treatment will also be dependent on availability of resources and existing treatment guidelines, considered by other Pain Management Practitioners as being common knowledge and practice, at the time of the intervention. For Medico-Legal purposes, it is also important to point out that variation in procedural techniques and pharmacological choices are the acceptable norm. The indications, contraindications, technique, and results of the above procedure should only be interpreted and judged by a Board-Certified Interventional Pain Specialist with extensive familiarity and expertise in the same exact procedure and technique.

## 2023-11-04 ENCOUNTER — Ambulatory Visit
Admission: RE | Admit: 2023-11-04 | Discharge: 2023-11-04 | Disposition: A | Discharge status: Home / Self Care | Source: Ambulatory Visit | Attending: Physician Assistant | Admitting: Physician Assistant

## 2023-11-04 ENCOUNTER — Telehealth: Payer: Self-pay | Admitting: *Deleted

## 2023-11-04 DIAGNOSIS — M810 Age-related osteoporosis without current pathological fracture: Secondary | ICD-10-CM | POA: Insufficient documentation

## 2023-11-04 NOTE — Telephone Encounter (Signed)
 Post procedure call; spoke with patient and she reports that she felt the steroids kept her awake but she is doing better.  Pain is better this morning.  No other questions or concerns.

## 2023-11-05 ENCOUNTER — Other Ambulatory Visit: Payer: Self-pay

## 2023-11-05 NOTE — Progress Notes (Signed)
 Specialty Pharmacy Refill Coordination Note  Summer Murphy is a 68 y.o. female contacted today regarding refills of specialty medication(s) Dupilumab  (Dupixent )   Patient requested Delivery   Delivery date: 11/18/23   Verified address: [address] [place]   Medication will be filled on 11/17/23.

## 2023-11-10 ENCOUNTER — Telehealth: Payer: Self-pay

## 2023-11-10 NOTE — Telephone Encounter (Signed)
 Patient call complaining of having a lot of diarrhea since increase dose of cefadroxil . She requesting to cancel her appointment tomorrow  due to the amount of diarrhea she is having and feeling sick on her stomach.   Per Dr. Fayette she would like for the patient to hold Cefadroxil  for now and come to her appointment tomorrow. We will also give her a Cdiff kit to take home and return to the lab. Patient advised and verbalized understanding.  Patient also advised to be sure she is staying hydrated.  Hoang Pettingill ONEIDA Ligas, CMA

## 2023-11-11 ENCOUNTER — Emergency Department
Admission: EM | Admit: 2023-11-11 | Discharge: 2023-11-11 | Discharge status: Against Medical Advice | Attending: Emergency Medicine | Admitting: Emergency Medicine

## 2023-11-11 ENCOUNTER — Encounter: Payer: Self-pay | Admitting: Infectious Diseases

## 2023-11-11 ENCOUNTER — Ambulatory Visit: Attending: Infectious Diseases | Admitting: Infectious Diseases

## 2023-11-11 ENCOUNTER — Other Ambulatory Visit: Payer: Self-pay

## 2023-11-11 ENCOUNTER — Emergency Department
Admission: EM | Admit: 2023-11-11 | Discharge: 2023-11-12 | Disposition: A | Discharge status: Home / Self Care | Source: Home / Self Care | Attending: Emergency Medicine | Admitting: Emergency Medicine

## 2023-11-11 ENCOUNTER — Emergency Department

## 2023-11-11 ENCOUNTER — Other Ambulatory Visit
Admission: RE | Admit: 2023-11-11 | Discharge: 2023-11-11 | Disposition: A | Discharge status: Home / Self Care | Source: Ambulatory Visit | Attending: Infectious Diseases | Admitting: Infectious Diseases

## 2023-11-11 VITALS — BP 118/54 | HR 91 | Temp 98.2°F | Ht 59.5 in | Wt 117.0 lb

## 2023-11-11 DIAGNOSIS — Z5321 Procedure and treatment not carried out due to patient leaving prior to being seen by health care provider: Secondary | ICD-10-CM | POA: Diagnosis not present

## 2023-11-11 DIAGNOSIS — R634 Abnormal weight loss: Secondary | ICD-10-CM | POA: Insufficient documentation

## 2023-11-11 DIAGNOSIS — Z6823 Body mass index (BMI) 23.0-23.9, adult: Secondary | ICD-10-CM | POA: Diagnosis not present

## 2023-11-11 DIAGNOSIS — B9561 Methicillin susceptible Staphylococcus aureus infection as the cause of diseases classified elsewhere: Secondary | ICD-10-CM | POA: Insufficient documentation

## 2023-11-11 DIAGNOSIS — R531 Weakness: Secondary | ICD-10-CM

## 2023-11-11 DIAGNOSIS — R110 Nausea: Secondary | ICD-10-CM | POA: Insufficient documentation

## 2023-11-11 DIAGNOSIS — D649 Anemia, unspecified: Secondary | ICD-10-CM | POA: Diagnosis not present

## 2023-11-11 DIAGNOSIS — R42 Dizziness and giddiness: Secondary | ICD-10-CM | POA: Diagnosis present

## 2023-11-11 DIAGNOSIS — T8463XD Infection and inflammatory reaction due to internal fixation device of spine, subsequent encounter: Secondary | ICD-10-CM

## 2023-11-11 DIAGNOSIS — R7989 Other specified abnormal findings of blood chemistry: Secondary | ICD-10-CM | POA: Insufficient documentation

## 2023-11-11 DIAGNOSIS — M4626 Osteomyelitis of vertebra, lumbar region: Secondary | ICD-10-CM | POA: Diagnosis not present

## 2023-11-11 DIAGNOSIS — M4620 Osteomyelitis of vertebra, site unspecified: Secondary | ICD-10-CM

## 2023-11-11 DIAGNOSIS — R197 Diarrhea, unspecified: Secondary | ICD-10-CM | POA: Diagnosis present

## 2023-11-11 DIAGNOSIS — Z981 Arthrodesis status: Secondary | ICD-10-CM | POA: Insufficient documentation

## 2023-11-11 DIAGNOSIS — M6281 Muscle weakness (generalized): Secondary | ICD-10-CM | POA: Diagnosis not present

## 2023-11-11 DIAGNOSIS — E876 Hypokalemia: Secondary | ICD-10-CM | POA: Insufficient documentation

## 2023-11-11 DIAGNOSIS — Z792 Long term (current) use of antibiotics: Secondary | ICD-10-CM | POA: Insufficient documentation

## 2023-11-11 DIAGNOSIS — M4624 Osteomyelitis of vertebra, thoracic region: Secondary | ICD-10-CM

## 2023-11-11 DIAGNOSIS — K449 Diaphragmatic hernia without obstruction or gangrene: Secondary | ICD-10-CM | POA: Insufficient documentation

## 2023-11-11 DIAGNOSIS — F419 Anxiety disorder, unspecified: Secondary | ICD-10-CM | POA: Insufficient documentation

## 2023-11-11 DIAGNOSIS — M4646 Discitis, unspecified, lumbar region: Secondary | ICD-10-CM

## 2023-11-11 DIAGNOSIS — J4489 Other specified chronic obstructive pulmonary disease: Secondary | ICD-10-CM | POA: Insufficient documentation

## 2023-11-11 LAB — CBC WITH DIFFERENTIAL/PLATELET
Abs Immature Granulocytes: 0.03 K/uL (ref 0.00–0.07)
Basophils Absolute: 0 K/uL (ref 0.0–0.1)
Basophils Relative: 0 %
Eosinophils Absolute: 0.2 K/uL (ref 0.0–0.5)
Eosinophils Relative: 2 %
HCT: 30 % — ABNORMAL LOW (ref 36.0–46.0)
Hemoglobin: 9.5 g/dL — ABNORMAL LOW (ref 12.0–15.0)
Immature Granulocytes: 0 %
Lymphocytes Relative: 21 %
Lymphs Abs: 1.7 K/uL (ref 0.7–4.0)
MCH: 26.8 pg (ref 26.0–34.0)
MCHC: 31.7 g/dL (ref 30.0–36.0)
MCV: 84.5 fL (ref 80.0–100.0)
Monocytes Absolute: 0.6 K/uL (ref 0.1–1.0)
Monocytes Relative: 8 %
Neutro Abs: 5.8 K/uL (ref 1.7–7.7)
Neutrophils Relative %: 69 %
Platelets: 385 K/uL (ref 150–400)
RBC: 3.55 MIL/uL — ABNORMAL LOW (ref 3.87–5.11)
RDW: 14.9 % (ref 11.5–15.5)
WBC: 8.4 K/uL (ref 4.0–10.5)
nRBC: 0 % (ref 0.0–0.2)

## 2023-11-11 LAB — URINALYSIS, ROUTINE W REFLEX MICROSCOPIC
Bacteria, UA: NONE SEEN
Bilirubin Urine: NEGATIVE
Glucose, UA: NEGATIVE mg/dL
Ketones, ur: NEGATIVE mg/dL
Leukocytes,Ua: NEGATIVE
Nitrite: NEGATIVE
Protein, ur: NEGATIVE mg/dL
Specific Gravity, Urine: 1.01 (ref 1.005–1.030)
pH: 6 (ref 5.0–8.0)

## 2023-11-11 LAB — COMPREHENSIVE METABOLIC PANEL WITH GFR
ALT: 10 U/L (ref 0–44)
AST: 14 U/L — ABNORMAL LOW (ref 15–41)
Albumin: 2.9 g/dL — ABNORMAL LOW (ref 3.5–5.0)
Alkaline Phosphatase: 77 U/L (ref 38–126)
Anion gap: 12 (ref 5–15)
BUN: 12 mg/dL (ref 8–23)
CO2: 25 mmol/L (ref 22–32)
Calcium: 8.8 mg/dL — ABNORMAL LOW (ref 8.9–10.3)
Chloride: 98 mmol/L (ref 98–111)
Creatinine, Ser: 0.46 mg/dL (ref 0.44–1.00)
GFR, Estimated: >60 mL/min (ref >60)
Glucose, Bld: 145 mg/dL — ABNORMAL HIGH (ref 70–99)
Potassium: 3.6 mmol/L (ref 3.5–5.1)
Sodium: 135 mmol/L (ref 135–145)
Total Bilirubin: 0.4 mg/dL (ref 0.0–1.2)
Total Protein: 6.4 g/dL — ABNORMAL LOW (ref 6.5–8.1)

## 2023-11-11 NOTE — Patient Instructions (Addendum)
 You are here for follow up of the infection in your spine which you had in Jan 2025- You have been on antibiotics since then. Since you had diarrhea the cefadroxil  was stopped yesterday-  you have been c/o weakness, light headedness- Your BP 104/54 HR 81 You did not eat anything this morning- so I got you some food to eat. You re going for labs- please do not drive if you have dizziness. Call your family to pick you up. if your symptoms get worse please come to the ED

## 2023-11-11 NOTE — ED Triage Notes (Signed)
 Pt to ED via POV from lab here. Pt was seen at PCP after having diarrhea from being on antibiotic for MSSA spine infection. Pt seen here for blood work and stool sample. While pt was at lab she reports started to feel worse and sent to ED. Pt denies abd pain, fevers, N/V.

## 2023-11-11 NOTE — ED Notes (Signed)
 Pt informed triage RN that she was leaving and going home. Pt ambulatory out of ED without assistance or distress.

## 2023-11-11 NOTE — Progress Notes (Signed)
 NAME: Summer Murphy  DOB: 10-08-1955  MRN: [id number]  Date/Time: 11/11/2023 12:03 PM   Subjective:  Pt here for follow up visit She is on Cefadroxil   for MSSA spine infection For the past few days she has had diarrhea and she called yesterday and we asked her to hold the cefadroxil - she has not taken it since yesterday morning and she has no diarrhea she says She still is feeling tired, weak and dizzy at times  She also saw the pain management physician on 8/13 an got ESI in the cervical area and also got a greater occipital nerve block. Both have helped with her neck pain tremendously  Saw her PCP on 8/8   Following taken from last visit note Summer Murphy is a 68 y.o female. with a history of cervical myelpathy for which she  underwent c3-c6 laminectomy and posterior fusion on 03/02/23, Asthma, COPD, barrettts esophagus, hiatus hernia, HTN,  B/l TKA  Admitted ti  [facility]  04/16/23-04/28/23 for MSSA bacteremia MRI of the cervical spine  showed collection both deep at the laminectomy bed and superficial at the site of surgical incision site. I/D by neurosurgeon on 04/17/23 and deep culture was MSSA  Pt was also c/o b/l groin pain and leg pain and had MRI of the lumbar spine on 04/25/23 and it showed discitis-osteomyelitis at L3-L4 and L4-L5. Small amount of enhancing fluid in the anterior epidural space between the L3 and L4 levels with suspected epidural abscess in the left lateral recess at the level of the L4 vertebral body. 2. Bilateral psoas muscle abscesses. IR aspiration of that fluid was no growth This was managed by Iv antibiotics   She was sent to SNF on 2/4 with 6 weeks of cefazolin  to finish on 06/07/23 But she was discharged from SNF on 05/18/23  because of insurance. She received weekly Dalbavancin X 3 doses at day surgery on 2/25, 3/4 and 3/11  She saw me on 3/18 and I started her on Po cefadroxil  1 gram BID for 8 weeks and then she went on cefadroxil  500mg  Po BID She  continued to have pain in her shoulders/neck and neurosurgery did CT in may 2025  and that showed loosening screws bilateral C3 and C6 laminar screws, and also the right C4 screw.  3. No postoperative arthrodesis identified, except for possible developing left C4-C5 facet fusion. Underlying degenerative appearing facet ankylosis on the left at C2-C3. They were concerned about osteoporosis and her ongoing weight loss She was seen by ortho PA  on 6/30 and ordered Dexa scan which was done on 8/14 . She is unable to take fosomax because of GERD She is followed by pulmonary and saw them on 7/14  for asthma and is on Dupilumab   it was decided to continue cefadroxil   500mg  Po BID indefinitely because of lossening screws on CT  On 7/22 Sed rate was 63 and CRP 3.2 The cefadroxil  was increased to 1 gram PO BID on 7/28 because of worsening neck pain. She saw pain management Dr.Lateef on 10/26/23. Received ESI to cervical spine area and nerve blck on 8/13 and is feeling much better.  Past Medical History:  Diagnosis Date   Acid reflux    Anemia    Anxiety    a.) buspirone  BID + BZO PRN (clonazepam )   Arthritis    Aspiration pneumonia (HCC) 06/02/2022   Asthma    CAP (community acquired pneumonia) 04/24/2022   Cervical disc disorder with radiculopathy of cervical region  Chronic pain syndrome    a.) on COT as prescribed by pain mangement   Chronic, continuous use of opioids    a.) followed by pain mangement   COPD (chronic obstructive pulmonary disease) (HCC)    DOE (dyspnea on exertion)    Hematuria    Hiatal hernia    History of kidney stones    Hypertension    Hypertensive urgency 05/12/2022   Hypoglycemia    Insomnia    a.) uses Trazodone  PRN   LBBB (left bundle branch block)    Migraine    MSSA bacteremia 04/19/2023   Murmur    Osteopenia    Palpitations    Pre-diabetes    Restless leg    a.) on ropinirole    Sepsis (HCC) 05/12/2022   Stenosis of cervical spine with myelopathy  (HCC)    T2DM (type 2 diabetes mellitus) (HCC)     Past Surgical History:  Procedure Laterality Date   ABDOMINAL HYSTERECTOMY     AMPUTATION TOE Left 07/31/2016   Procedure: AMPUTATION TOE/MPJ 2nd toe;  Surgeon: Neill Boas, DPM;  Location: [facility] ORS;  Service: Podiatry;  Laterality: Left;   APPENDECTOMY  1990   BACK SURGERY     low back   BREAST SURGERY     bilateral breast reduction   CARDIAC ELECTROPHYSIOLOGY STUDY AND ABLATION     CERVICAL WOUND DEBRIDEMENT N/A 04/17/2023   Procedure: CERVICAL WOUND DEBRIDEMENT;  Surgeon: Bluford Standing, MD;  Location: [facility];  Service: Neurosurgery;  Laterality: N/A;   CHOLECYSTECTOMY  1990   COLONOSCOPY WITH PROPOFOL  N/A 01/04/2017   Procedure: COLONOSCOPY WITH PROPOFOL ;  Surgeon: Viktoria Lamar DASEN, MD;  Location: [facility] ENDOSCOPY;  Service: Endoscopy;  Laterality: N/A;   CORNEAL TRANSPLANT     ESOPHAGOGASTRODUODENOSCOPY N/A 04/09/2021   Procedure: ESOPHAGOGASTRODUODENOSCOPY (EGD);  Surgeon: Unk Corinn Skiff, MD;  Location: [facility] ENDOSCOPY;  Service: Gastroenterology;  Laterality: N/A;   ESOPHAGOGASTRODUODENOSCOPY (EGD) WITH PROPOFOL  N/A 01/04/2017   Procedure: ESOPHAGOGASTRODUODENOSCOPY (EGD) WITH PROPOFOL ;  Surgeon: Viktoria Lamar DASEN, MD;  Location: [facility] ENDOSCOPY;  Service: Endoscopy;  Laterality: N/A;   EXCISION BONE CYST Left 07/31/2016   Procedure: EXCISION BONE CYST/exostectomy 28124/left 2nd;  Surgeon: Neill Boas, DPM;  Location: [facility];  Service: Podiatry;  Laterality: Left;   EXTRACORPOREAL SHOCK WAVE LITHOTRIPSY Left 09/12/2015   Procedure: EXTRACORPOREAL SHOCK WAVE LITHOTRIPSY (ESWL);  Surgeon: Rosina Riis, MD;  Location: [facility];  Service: Urology;  Laterality: Left;   FRACTURE SURGERY     left foot   GUM SURGERY Left    gum infection   HARDWARE REMOVAL Left 11/14/2020   Procedure: HARDWARE REMOVAL;  Surgeon: Kathlynn Sharper, MD;  Location: [facility] OR[facility];  Service: Orthopedics;  Laterality: Left;   HH repair     Fundoplication    JOINT REPLACEMENT Bilateral 2013,2014   total knees   LAPAROSCOPIC HYSTERECTOMY     LITHOTRIPSY     periprosthetic supracondylar fracture of left femur  02/16/2020   [facility]   POSTERIOR CERVICAL FUSION/FORAMINOTOMY N/A 03/02/2023   Procedure: C3-6 POSTERIOR CERVICAL LAMINECTOMY AND FUSION;  Surgeon: Claudene Riis ORN, MD;  Location: [facility] ORS;  Service: Neurosurgery;  Laterality: N/A;   TONSILLECTOMY     TOTAL KNEE REVISION Right 11/14/2019   Procedure: Revision patella and tibial polyethylene;  Surgeon: Kathlynn Sharper, MD;  Location: [facility] OR[facility];  Service: Orthopedics;  Laterality: Right;   URETEROSCOPY      Social History   Socioeconomic History   Marital status: Widowed    Spouse name: Not  on file   Number of children: Not on file   Years of education: Not on file   Highest education level: Not on file  Occupational History   Not on file  Tobacco Use   Smoking status: Never    Passive exposure: Past   Smokeless tobacco: Never  Vaping Use   Vaping status: Never Used  Substance and Sexual Activity   Alcohol use: No   Drug use: No   Sexual activity: Not Currently  Other Topics Concern   Not on file  Social History Narrative   Lives at home alone. Relatives are the support person.    Social Drivers of Health   Financial Resource Strain: Medium Risk (05/27/2023)   Received from [facility]   Overall Financial Resource Strain (CARDIA)    Difficulty of Paying Living Expenses: Somewhat hard  Food Insecurity: Food Insecurity Present (05/27/2023)   Received from [facility]   Hunger Vital Sign    Within the past 12 months, you worried that your food would run out before you got the money to buy more.: Never true    Within the past 12 months, the food you bought just didn't last and you didn't have money to get more.: Sometimes true  Transportation Needs: No Transportation Needs (05/27/2023)   Received from [facility] - Transportation    In the past 12 months, has lack of transportation kept you from medical appointments or from getting medications?: No    Lack of Transportation (Non-Medical): No  Physical Activity: Not on file  Stress: Not on file  Social Connections: Moderately Isolated (04/16/2023)   Social Connection and Isolation Panel    Frequency of Communication with Friends and Family: More than three times a week    Frequency of Social Gatherings with Friends and Family: Once a week    Attends Religious Services: More than 4 times per year    Active Member of [employer] or Organizations: No    Attends Banker Meetings: Never    Marital Status: Widowed  Intimate Partner Violence: Not At Risk (04/16/2023)   Humiliation, Afraid, Rape, and Kick questionnaire    Fear of Current or Ex-Partner: No    Emotionally Abused: No    Physically Abused: No    Sexually Abused: No    Family History  Problem Relation Age of Onset   Hypertension Mother    Stroke Mother    Prostate cancer Father    Kidney Stones Father    Diabetes Brother    Hypertension Brother    Breast cancer Maternal Aunt    Kidney disease Neg Hx    Allergies  Allergen Reactions   Librium [Chlordiazepoxide] Shortness Of Breath   Reglan [Metoclopramide] Hives and Other (See Comments)    hallucinations    Vanilla Shortness Of Breath    Pt reports allergy to vanilla extract only   Aspirin Hives   Nsaids Rash    Rash/flares asthma issues.   Azithromycin  Other (See Comments)    Unsure of what the reaction was.   Buprenorphine Hcl Other (See Comments)    Unsure of what the reaction was.    Flexeril  [Cyclobenzaprine ]     hallucinations   Morphine Other (See Comments)    Unsure of reaction   Sulfa Antibiotics     Other reaction(s): Unknown   Tolmetin     Other Reaction: Allergy   Amlodipine  Besylate Itching and Rash  arms, stomach and forehead   Iron  Nausea And Vomiting   I? Current Outpatient  Medications  Medication Sig Dispense Refill   Accu-Chek Softclix Lancets lancets Use 1 lancet to check glucose once daily and as needed for prediabetes R73.03 100 each 12   acetaminophen  (TYLENOL ) 500 MG tablet Take 2 tablets (1,000 mg total) by mouth every 6 (six) hours as needed. 100 tablet 2   alendronate  (FOSAMAX ) 70 MG tablet TAKE 1 TABLET BY MOUTH WEEKLY  TAKE WITH A FULL GLASS OF WATER   ON AN EMPTY STOMACH 12 tablet 3   Blood Glucose Monitoring Suppl (ACCU-CHEK GUIDE ME) w/Device KIT Use device to check glucose daily and as need for prediabetes R73.03 1 kit 0   busPIRone  (BUSPAR ) 10 MG tablet TAKE 1 TABLET(10 MG) BY MOUTH TWICE DAILY 60 tablet 2   cefadroxil  (DURICEF) 500 MG capsule Take 2 capsules (1,000 mg total) by mouth 2 (two) times daily. 120 capsule 1   clonazePAM  (KLONOPIN ) 1 MG tablet Take 0.5-1 tablets (0.5-1 mg total) by mouth 2 (two) times daily as needed for anxiety. 60 tablet 0   COMBIVENT  RESPIMAT 20-100 MCG/ACT AERS respimat 1 puff every 6 (six) hours. (Patient taking differently: Inhale 1 puff into the lungs every 6 (six) hours. As needed)     diclofenac  Sodium (VOLTAREN ) 1 % GEL Apply 4 g topically 4 (four) times daily. (Patient taking differently: Apply 4 g topically 4 (four) times daily as needed (pain).) 350 g 1   DULoxetine  (CYMBALTA ) 60 MG capsule Take 1 capsule (60 mg total) by mouth at bedtime. 90 capsule 1   Dupilumab  (DUPIXENT ) 300 MG/2ML SOAJ Inject 300 mg into the skin every 14 (fourteen) days. **loading dose completed in office on 07/02/23** 12 mL 1   ELIQUIS  5 MG TABS tablet TAKE 1 TABLET(5 MG) BY MOUTH TWICE DAILY 60 tablet 1   fluticasone -salmeterol (WIXELA INHUB) 250-50 MCG/ACT AEPB Inhale 1 puff into the lungs in the morning and at bedtime. 60 each 12   gabapentin  (NEURONTIN ) 300 MG capsule Take 1 capsule (300 mg total) by mouth 2 (two) times daily. 60 capsule 5   glucose blood (ACCU-CHEK GUIDE TEST) test strip Use 1 test strip to check glucose once daily and  as needed for prediabetes R73.03 100 each 12   HYDROcodone -acetaminophen  (NORCO/VICODIN) 5-325 MG tablet Take 1 tablet by mouth 2 (two) times daily as needed for severe pain (pain score 7-10). 45 tablet 0   ipratropium-albuterol  (DUONEB) 0.5-2.5 (3) MG/3ML SOLN Take 3 mLs by nebulization every 6 (six) hours as needed.     lidocaine  (LIDODERM ) 5 % Place 1 patch onto the skin daily. To affected area of the neck. Remove & Discard patch within 12 hours or as directed by MD 30 patch 4   methocarbamol  (ROBAXIN ) 750 MG tablet Take 1 tablet (750 mg total) by mouth every 8 (eight) hours as needed for muscle spasms. 90 tablet 5   methylPREDNISolone  (MEDROL  DOSEPAK) 4 MG TBPK tablet Take as Directed, per package instructions 1 each 0   montelukast  (SINGULAIR ) 10 MG tablet TAKE 1 TABLET(10 MG) BY MOUTH AT BEDTIME 90 tablet 1   ondansetron  (ZOFRAN ) 4 MG tablet TAKE 1 TABLET BY MOUTH TWICE DAILY AS NEEDED 45 tablet 1   oxybutynin  (DITROPAN -XL) 10 MG 24 hr tablet TAKE 1 TABLET(10 MG) BY MOUTH AT BEDTIME 30 tablet 5   pantoprazole  (PROTONIX ) 40 MG tablet Take 1 tablet (40 mg total) by mouth 2 (two) times daily. 180 tablet 1  polyethylene glycol (MIRALAX  / GLYCOLAX ) 17 g packet Take 17 g by mouth 2 (two) times daily. (Patient taking differently: Take 17 g by mouth daily as needed.)     potassium chloride  (KLOR-CON  10) 10 MEQ tablet Take 1 tablet (10 mEq total) by mouth daily. 90 tablet 1   rOPINIRole  (REQUIP ) 1 MG tablet Take 1 tablet (1 mg total) by mouth at bedtime. 90 tablet 1   SUMAtriptan  (IMITREX ) 100 MG tablet Take 1 tablet (100 mg total) by mouth every 2 (two) hours as needed (for migraine headaches.). May repeat in 1 hours if headache persists or recurs. 10 tablet 3   traZODone  (DESYREL ) 150 MG tablet TAKE 1 TABLET(150 MG) BY MOUTH AT BEDTIME 30 tablet 2   No current facility-administered medications for this visit.     Abtx:  Anti-infectives (From admission, onward)    None       REVIEW OF  SYSTEMS:  Const: negative fever, negative chills,  weight loss- neurosurgeon wants her to gain weight upto 130 Eyes: negative diplopia or visual changes, negative eye pain ENT: negative coryza, negative sore throat Resp: negative cough, hemoptysis, dyspnea Cards: negative for chest pain, palpitations, lower extremity edema GU: negative for frequency, dysuria and hematuria GI: Negative for abdominal pain, diarrhea, bleeding, constipation Skin: negative for rash and pruritus Heme: negative for easy bruising and gum/nose bleeding FD:wzrx pain much better Psych:  anxiety, depression  Endocrine: negative for thyroid , diabetes Allergy/Immunology- as above Objective:  VITALS:  BP (!) 118/54   Pulse 91   Temp 98.2 F (36.8 C) (Temporal)   Ht 4' 11.5 (1.511 m)   Wt 117 lb (53.1 kg)   SpO2 92%   BMI 23.24 kg/m   PHYSICAL EXAM:  General: Alert, cooperative, no distress, weak Head: Normocephalic, without obvious abnormality, atraumatic. Eyes: Conjunctivae clear, anicteric sclerae. Pupils are equal ENT Nares normal. No drainage or sinus tenderness. Lips, mucosa, and tongue normal. No Thrush Neck:surgical scar healed well Back: No CVA tenderness. Lungs: Clear to auscultation bilaterally. No Wheezing or Rhonchi. No rales. Heart: Regular rate and rhythm, no murmur, rub or gallop. Abdomen: Soft, non-tender,not distended. Bowel sounds normal. No masses Extremities: atraumatic, no cyanosis. No edema. No clubbing Skin: No rashes or lesions. Or bruising Lymph: Cervical, supraclavicular normal. Neurologic: Grossly non-focal Pertinent Labs Lab Results CBC    Component Value Date/Time   WBC 8.1 10/12/2023 1055   RBC 3.81 (L) 10/12/2023 1055   HGB 10.7 (L) 10/12/2023 1055   HGB 12.6 07/30/2023 0956   HGB 13.4 08/29/2021 1303   HCT 33.1 (L) 10/12/2023 1055   HCT 39.4 08/29/2021 1303   PLT 430 (H) 10/12/2023 1055   PLT 255 07/30/2023 0956   PLT 186 08/29/2021 1303   MCV 86.9 10/12/2023  1055   MCV 91 08/29/2021 1303   MCV 84 06/28/2014 1710   MCH 28.1 10/12/2023 1055   MCHC 32.3 10/12/2023 1055   RDW 14.0 10/12/2023 1055   RDW 12.6 08/29/2021 1303   RDW 16.7 (H) 06/28/2014 1710   LYMPHSABS 1.4 10/12/2023 1055   LYMPHSABS 2.0 08/29/2021 1303   LYMPHSABS 0.6 (L) 06/13/2013 0505   MONOABS 0.4 10/12/2023 1055   MONOABS 0.1 (L) 06/13/2013 0505   EOSABS 0.1 10/12/2023 1055   EOSABS 0.2 08/29/2021 1303   EOSABS 0.0 06/13/2013 0505   BASOSABS 0.1 10/12/2023 1055   BASOSABS 0.0 08/29/2021 1303   BASOSABS 0.0 06/13/2013 0505       Latest Ref Rng & Units 10/12/2023  10:55 AM 07/12/2023   10:14 AM 06/18/2023   11:57 AM  CMP  Glucose 70 - 99 mg/dL 892   883   BUN 8 - 23 mg/dL 11   15   Creatinine 9.55 - 1.00 mg/dL 9.56   9.49   Sodium 864 - 145 mmol/L 140   138   Potassium 3.5 - 5.1 mmol/L 3.3  4.0  4.6   Chloride 98 - 111 mmol/L 99   100   CO2 22 - 32 mmol/L 28   28   Calcium 8.9 - 10.3 mg/dL 9.2   9.5   Total Protein 6.5 - 8.1 g/dL 7.0   6.9   Total Bilirubin 0.0 - 1.2 mg/dL 0.3   0.4   Alkaline Phos 38 - 126 U/L 90   78   AST 15 - 41 U/L 20   20   ALT 0 - 44 U/L 14   13     ? Impression/Recommendation  c3-c6 laminectomy and posterior fusion on 03/02/23? Complicated by MSSA bacteremia  due to cervical spine surgical site infection 04/16/23 MRI showed superficial and deep abscess- underwent I/D on 04/17/23 and culture was MSSA Repeat blood culture from 1/26  and 2/2 were neg Pt was to have  IV cefazoin for total of 6 weeks until 3/17, but on 2/25 she was discharged from SNF due to insurance- So she received Iv dalbavance weekly dose ( 2/25, 1500mg , 3/4 1 grm, 3./11 - 1gm. Because of hardware added rifampin  300mg  Po BID but she was not able to to tolerate the rifampin  as she had severe nausea and poor appetite.  Hence it was discontinued.  On 06/08/23 she was  started on  PO cefadroxil  1 gram Q 12 -with a plan to give for 8 weeks because of hardware  CT 07/29/23 done  on 07/29/23 showed Loosening of the bilateral C3 and C6 laminar screws, also the right C4 screw So it was decided to continue cefadroxil   500mg  Po BID indefinitely   On 7/22 Sed rate was 63 and CRP 3.2 The cefadroxil  was increased to 1 gram PO BID on 7/28 because of worsening neck pain. She saw pain management Dr.Lateef on 10/26/23. Received ESI to cervical spine area and nerve block on 8/13 and is feeling much better  Was c/o severe diarrhea and cefadroxil  was stopped 11/10/23 NO more diarrhea she says Today will do labs ESR/CRP/CBC/CMP Will do cdiff test if diarrhea returns  Weakness, dizziness, weight loss BP on the lower side 104/54 HR 80- r/o dehydration Will check serum cortisol as it was slow in Feb 2024 She did not eat anything this morning- so obtained food for her to eat now Sent to the lab after that I will let her PCP know     L4 and L4-L5 discitis and osteomyelitis -treated  underwent aspiration of the collection on 04/25/23  but culture neg- Lumbar  pain is not an issue anymore     Anemia    Hiatus hernia h/o fundoplication Dysphagia has had esophageal dilatation Nov 2024 Biopsy no evidence of metaplasia ? Anxiety  on meds ? Hypokalemia- intermittent   Asthma/COPD- followed by pulmonologist and  on dupilumab   Told patient if she continues to be dizzy then to call family to take her home rather than driving If worse to go to the ED  ________________________________________________  Follow up after labs

## 2023-11-11 NOTE — Progress Notes (Signed)
   11/11/23 1400  Spiritual Encounters  Type of Visit Initial  Care provided to: Patient  [place] partners present during Programmer, systems;Other (comment) (ED Triage)  Referral source Code page;Other (comment) (Rapid Response to ED Triage)  Reason for visit Code  OnCall Visit No  Spiritual Framework  Presenting Themes Goals in life/care;Values and beliefs;Other (comment) (Pt mentioned she had called her Juliene)  Patient Stress Factors Exhausted;Health changes;Other (Comment) (Nurse mentioned that Pt appeared to be very anxious and could use a visit)  Interventions  Spiritual Care Interventions Made Established relationship of care and support;Compassionate presence;Reflective listening  Intervention Outcomes  Outcomes Connection to spiritual care;Awareness around self/spiritual resourses;Awareness of support

## 2023-11-11 NOTE — ED Triage Notes (Signed)
 Pt presents to the ED via [facility] with complaints of Dizziness x 1 month. Pt was seen here earlier and left after triage. A&Ox4 at this time. Denies abdominal pain, N/V, CP or SOB.

## 2023-11-12 DIAGNOSIS — R197 Diarrhea, unspecified: Secondary | ICD-10-CM | POA: Diagnosis not present

## 2023-11-12 MED ORDER — OXYCODONE-ACETAMINOPHEN 5-325 MG PO TABS
2.0000 | ORAL_TABLET | Freq: Once | ORAL | Status: AC
  Administered 2023-11-12: 2 via ORAL
  Filled 2023-11-12: qty 2

## 2023-11-12 NOTE — ED Provider Notes (Signed)
 [facility] Provider Note    Event Date/Time   First MD Initiated Contact with Patient 11/12/23 0028     (approximate)   History   No chief complaint on file.   HPI Summer Murphy is a 68 y.o. female who presents for evaluation of lightheadedness and feeling ill in general.  She has an extensive recent medical history including chronic pain and recent cervical spine surgery with subsequent vertebral osteomyelitis including in the lumbar spine for which she has been on chronic antibiotics.  She is followed by Dr. Searcy and had an appointment in clinic yesterday but she has been taken off of the antibiotics due to the patient's persistent diarrhea.  She has been having intermittent lightheadedness and dizziness for about a month.  It comes and goes and nothing in particular makes it better or worse.  She said that she has also been very anxious with everything going on and was not sure if her nerves could be adding to the issue.  She has not been vomiting but she has been occasionally nauseated.  She has not been eating very well because of the diarrhea and not wanting to eat much, but she has been drinking plenty of fluids in her own estimation.  No fever, no weakness in arms or legs, no difficulty with speech or word finding.     Physical Exam   Triage Vital Signs: ED Triage Vitals [11/11/23 2009]  Encounter Vitals Group     BP 116/64     Girls Systolic BP Percentile      Girls Diastolic BP Percentile      Boys Systolic BP Percentile      Boys Diastolic BP Percentile      Pulse Rate 92     Resp 18     Temp 98 F (36.7 C)     Temp Source Oral     SpO2 98 %     Weight      Height      Head Circumference      Peak Flow      Pain Score      Pain Loc      Pain Education      Exclude from Growth Chart     Most recent vital signs: Vitals:   11/12/23 0030 11/12/23 0215  BP: 136/85 (!) 140/75  Pulse: 81 88  Resp: 16 18  Temp:    SpO2: 100%  100%    General: Awake,  Appears chronically ill and anxious but she is conversant and nontoxic in appearance. CV:  Good peripheral perfusion.  Regular rate and rhythm. Resp:  Normal effort. Speaking easily and comfortably, no accessory muscle usage nor intercostal retractions.   Abd:  No distention.   Other:  No appreciable focal neurological deficits.  She is moving all of her extremities without any apparent difficulty.  She is anxious but otherwise acting appropriate.   ED Results / Procedures / Treatments   Labs (all labs ordered are listed, but only abnormal results are displayed) Labs Reviewed  CBC WITH DIFFERENTIAL/PLATELET - Abnormal; Notable for the following components:      Result Value   RBC 3.55 (*)    Hemoglobin 9.5 (*)    HCT 30.0 (*)    All other components within normal limits  COMPREHENSIVE METABOLIC PANEL WITH GFR - Abnormal; Notable for the following components:   Glucose, Bld 145 (*)    Calcium 8.8 (*)    Total Protein  6.4 (*)    Albumin 2.9 (*)    AST 14 (*)    All other components within normal limits  URINALYSIS, ROUTINE W REFLEX MICROSCOPIC - Abnormal; Notable for the following components:   Color, Urine STRAW (*)    APPearance CLEAR (*)    Hgb urine dipstick MODERATE (*)    All other components within normal limits     RADIOLOGY I independently viewed and interpreted the patient's CT head and I see no evidence of intracranial hemorrhage nor of obvious CVA.  I also read the radiologist's report, which confirmed no acute findings.   PROCEDURES:  Critical Care performed: No  Procedures    IMPRESSION / MDM / ASSESSMENT AND PLAN / ED COURSE  I reviewed the triage vital signs and the nursing notes.                              Differential diagnosis includes, but is not limited to, volume depletion, medication or drug side effect, anxiety, CVA, intracranial hemorrhage.  Patient's presentation is most consistent with acute presentation with  potential threat to life or bodily function.  Labs/studies ordered: CT head, CBC with differential, CMP, urinalysis  Interventions/Medications given:  Medications  oxyCODONE -acetaminophen  (PERCOCET/ROXICET) 5-325 MG per tablet 2 tablet (2 tablets Oral Given 11/12/23 0214)    (Note:  hospital course my include additional interventions and/or labs/studies not listed above.)   Labs are generally reassuring and stable.  No laboratory suggestion of volume depletion/dehydration or acute kidney injury, and vitals are stable and reassuring.  No acute abnormality on CT head.  Patient said she is asymptomatic now but the symptoms come and go.  She speculated that perhaps her anxiety has something to do with it.  She goes to pain management and is on a number of medications including pain medicine, benzodiazepines, muscle relaxers, etc.  I reviewed the office visit note from yesterday written by Dr. Searcy and read about the patient's extensive history of spinal issues including her osteomyelitis.    I provided reassurance that her labs are generally normal and that she appears to be keeping her self well-hydrated.  I told her that as long as she is taking good oral fluids there should be no need for IV fluids at this time particularly given the stable vital signs and reassuring lab work.  We talked about obtaining an MRI brain but given the intermittent nature of the symptoms I think it is extremely unlikely she has had a posterior circulation stroke leading to her lightheadedness/dizziness.  Considered hospitalization but I do not think that there is anything emergent or acute they can be treated in the hospital.  After we discussed the plan, and we agreed that she can follow-up with her primary care doctor and other doctors as needed and will continue to hydrate herself, will try to eat a better diet, and we will see how she does after coming off of the antibiotics that seem to be giving her  diarrhea.  The patient is reporting more neck pain after the CT scan she had earlier, so I ordered 2 Percocet which appear on her med list.  She will follow-up with her pain management doctor as well.  I gave my usual and customary follow-up recommendations and return precautions.       FINAL CLINICAL IMPRESSION(S) / ED DIAGNOSES   Final diagnoses:  Lightheadedness     Rx / DC Orders   ED  Discharge Orders     None        Note:  This document was prepared using Dragon voice recognition software and may include unintentional dictation errors.   Gordan Huxley, MD 11/12/23 [phone]

## 2023-11-12 NOTE — Discharge Instructions (Signed)
 Your workup in the Emergency Department today was reassuring.  We did not find any specific abnormalities.  We recommend you drink plenty of fluids, take your regular medications and/or any new ones prescribed today, and follow up with the doctor(s) listed in these documents as recommended.  Return to the Emergency Department if you develop new or worsening symptoms that concern you.

## 2023-11-12 NOTE — ED Notes (Signed)
 Patient assisted to use bathroom. Patient ambulatory with steady gait and without complication. Patient returned to bed and placed back on monitor. Patient's bed in lowest position with call light in reach.

## 2023-11-15 NOTE — Progress Notes (Signed)
 Chief Complaint:   Chief Complaint  Patient presents with  . Knee Pain    X 2 days Pt was walking around [employer] and by the end of her visit L knee was in pain Pt has hx of break in L leg, bilat knee replacement Denies recent injury Denies heavy lifting/ pulling Has appt to see Dr. Kathlynn on 8/28; was told to come to walk in for evaluation as they were unable to see her today Persistent sharp throbbing pain in L knee Unable to fully bear weight on L knee Difficulty ambulating; using walker for assistance Does not normally need assistance with ambulating  . Joint Swelling    No redness or warmth noted on assessment Swelling noted Pt has taken px muscle relaxer and OTC tylenol ; not providing much relief Pt has also elevated leg and applied cold compress Currently wearing brace on L knee for support    Subjective:   Summer Murphy is a 69 y.o. female established patient in today for:  HPI Patient presents to clinic with complaint of left knee pain. Onset about two days ago. Patient reports she was walking around [employer]. By the end of her visit, she was having left medial knee pain. No direct falls or injuries. Complains of swelling and pain. She typically does not require assistive devices to get around, but she has been using a walker. She is s/p b/l TKA. She denies fever, dyspnea, chest pain, calf pain, nausea, vomiting, numbness, tingling. She has taken tylenol , ice, and elevated with minimal relief.     Past Medical History:  Diagnosis Date  . Allergic state   . Arthritis   . Asthma without status asthmaticus (HHS-HCC)   . Chickenpox   . Depression   . Diabetes mellitus type 2, uncomplicated (CMS/HHS-HCC)   . GERD (gastroesophageal reflux disease)   . Hyperlipidemia   . Hypertension   . Insomnia   . Kidney stones   . Kidney stones   . Migraines   . Osteoporosis   . Rheumatoid arthritis(714.0) (CMS/HHS-HCC)   . 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  Shingles     Past Surgical History:  Procedure Laterality Date  . COLONOSCOPY  01/21/2001   Adenomatous Polyp  . JOINT REPLACEMENT Right 10/29/2011   RIGHT TOTAL KNEE REPLACEMENT  . EGD  01/16/2013   Barrett's Esophagus [facility])  . Left total knee replacement Left 10/26/2013  . Arthroscopy, partial synovectomy, left knee Left 03/01/2014  . EGD  04/28/2016   Barrett's Esophagus [facility])  . EGD  01/04/2017   No Barrett's Seen: No repeat per RTE  . COLONOSCOPY  01/04/2017   Aborted d/t Poor Prep: FU GI OV made 02/25/2017 @ 2pm w/MJohnson PA (dw)  . Total Knee Revision Surgery Right 11/14/2019   Menz, poly exchange  . OPEN REDUCTION FEMORAL FRACTURE SUPRACONDYLAR/TRANSCONDYLAR Left 02/17/2020   Procedure: OPEN TREATMENT OF FEMORAL SUPRACONDYLAR OR TRANSCONDYLAR FRACTURE WITHINTERCONDYLAR EXTENSION, INCLUDES INTERNAL FIXATION, WHEN PERFORMED;  Surgeon: Martell Gist, MD;  Location: [facility] OR;  Service: Orthopedics;  Laterality: Left;  . Hardware removal left femur Left 11/14/2020   Menz  . APPENDECTOMY    . CHOLECYSTECTOMY    . COLONOSCOPY  01/15/2012, 06/10/2004   PH Adenomatous Polyp: CBF 12/2016; Recall Ltr mailed 12/04/2016 (dw)  . EGD  04/17/1986, 03/08/1987, 06/12/1987, 09/16/1987, 03/18/1988, 03/07/1991, 01/21/2001, 06/10/2004, 11/04/2005, 06/08/2008, 07/12/2008, 05/06/2009, 07/03/2009, 09/18/2009, 06/29/2011, 11/10/2011, 01/15/2012  . HYSTERECTOMY    . Nissen fundoplication x2    . Periprosthetic supracondylar  fracture of femur, hardware removal left Left   . REDUCTION MAMMAPLASTY    . SIGMOIDOSCOPY FLEXIBLE  12/21/1985, 03/11/1987, 02/04/1990    Outpatient Medications Prior to Visit  Medication Sig Dispense Refill  . ACCU-CHEK GUIDE ME GLUCOSE MTR Misc once daily AS DIRECTED    . ACCU-CHEK GUIDE TEST STRIPS test strip USE AS INSTRUCTED ONCE DAILY    . acetaminophen  (TYLENOL ) 500 MG tablet Take 1,000 mg by mouth every 8 (eight) hours as needed for Pain    .  acetaminophen -codeine (TYLENOL  #3) 300-30 mg tablet Take by mouth    . alendronate  (FOSAMAX ) 70 MG tablet 70 mg every 7 (seven) days    . apixaban  (ELIQUIS ) 5 mg tablet Take 5 mg by mouth 2 (two) times daily    . BIOTIN  ORAL Take by mouth    . budesonide  (PULMICORT ) 0.25 mg/2 mL nebulizer solution Inhale 0.25 mg into the lungs 2 (two) times daily    . busPIRone  (BUSPAR ) 10 MG tablet Take 10 mg by mouth 2 (two) times daily    . cefadroxil  (DURICEF) 500 mg capsule Take 1,000 mg by mouth 2 (two) times daily    . celecoxib  (CELEBREX ) 100 MG capsule Take 100 mg by mouth 2 (two) times daily    . citalopram (CELEXA) 10 MG tablet Take 10 mg by mouth    . clonazePAM  (KLONOPIN ) 0.5 MG tablet Take 1 tablet (0.5 mg total) by mouth nightly as needed for Anxiety 5 tablet 0  . clotrimazole -betamethasone  (LOTRISONE ) 1-0.05 % cream Apply 1 Application topically 2 (two) times daily    . COMBIVENT  RESPIMAT 20-100 mcg/actuation inhaler Inhale 1 inhalation into the lungs 4 (four) times daily       . diclofenac  (VOLTAREN ) 1 % topical gel Apply 2 g topically 3 (three) times daily 100 g 1  . diltiazem  (DILT-XR) 180 MG XR capsule Take 1 capsule by mouth once daily.    . diphenhydrAMINE  (BENADRYL ) 25 mg tablet Take 1 tablet by mouth 2 (two) times daily as needed    . diphenoxylate -atropine  (LOMOTIL ) 2.5-0.025 mg tablet Take 1 tablet by mouth 4 (four) times daily as needed for Diarrhea 30 tablet 2  . DULoxetine  (CYMBALTA ) 60 MG DR capsule Take 60 mg by mouth once daily       . DUPIXENT  PEN 300 mg/2 mL pen injector     . EPINEPHrine  (EPIPEN ) 0.3 mg/0.3 mL auto-injector Inject subcutaneously as needed    . famotidine  (PEPCID ) 40 MG tablet TAKE 1 TABLET(40 MG) BY MOUTH EVERY NIGHT AS NEEDED FOR HEARTBURN 90 tablet 0  . ferrous fum-vit C-vit B12-FA 460-60-0.01-1 mg Cap Take 1 capsule by mouth 2 (two) times daily    . fluticasone  furoate-vilanteroL (BREO ELLIPTA ) 200-25 mcg/dose DsDv Inhale 1 inhalation into the lungs once  daily       . fluticasone  propion-salmeteroL (ADVAIR DISKUS) 250-50 mcg/dose diskus inhaler Inhale 1 Inhalation into the lungs 2 (two) times daily    . fluticasone  propionate (FLONASE ) 50 mcg/actuation nasal spray Place into one nostril    . loratadine  (CLARITIN ) 10 mg tablet Take 1 tablet by mouth once daily    . meclizine  (ANTIVERT ) 25 mg tablet Take 0.5-1 tablets by mouth 3 (three) times daily as needed    . methocarbamoL  (ROBAXIN ) 750 MG tablet Take 750 mg by mouth 3 (three) times daily    . montelukast  (SINGULAIR ) 10 mg tablet Take 10 mg by mouth once daily as needed.    . nystatin  (MYCOSTATIN ) 100,000 unit/mL suspension SHAKE  LIQUID AND TAKE 5 ML BY MOUTH FOUR TIMES DAILY FOR 5 DAYS    . omeprazole  (PRILOSEC) 40 MG DR capsule TAKE 1 CAPSULE(40 MG) BY MOUTH TWICE DAILY BEFORE MEALS 60 capsule 0  . ondansetron  (ZOFRAN ) 4 MG tablet Take 1 tablet by mouth as needed    . oxybutynin  (DITROPAN ) 5 mg tablet Take 5 mg by mouth 2 (two) times daily as needed       . pantoprazole  (PROTONIX ) 40 MG DR tablet     . pilocarpine  (SALAGEN ) 5 mg tablet Take 5 mg by mouth 2 (two) times daily    . polyethylene glycol (MIRALAX ) packet Take 17 g by mouth 2 (two) times daily    . potassium chloride  (KLOR-CON  M20) 20 MEQ ER tablet Take 1 mEq by mouth 2 (two) times daily    . REXULTI  4 mg Tab Take 1 tablet by mouth every morning    . rOPINIRole  (REQUIP ) 0.5 MG tablet Take two tablets po BID for restless legs.    . sennosides (SENOKOT) 8.6 mg tablet Take 8.6 mg by mouth once daily    . sennosides-docusate (SENOKOT-S) 8.6-50 mg tablet Take by mouth    . SPIRIVA  RESPIMAT 2.5 mcg/actuation inhalation spray INHALE 1 PUFF  ITL BID    . SUMAtriptan  (IMITREX ) 100 MG tablet Take by mouth    . SYMBICORT  160-4.5 mcg/actuation inhaler 1 spray 2 (two) times daily.    . tiZANidine  (ZANAFLEX ) 4 MG tablet Take 4 mg by mouth every 8 (eight) hours    . traMADoL  (ULTRAM ) 50 mg tablet Take 1 tablet by mouth every 6 (six) hours    .  traZODone  (DESYREL ) 50 MG tablet Take 50 mg by mouth nightly Take one tab by mouth nightly.  May repeat once if needed    . HYDROcodone -acetaminophen  (NORCO) 5-325 mg tablet Take 1 tablet by mouth 2 (two) times daily as needed for Pain    . gabapentin  (NEURONTIN ) 600 MG tablet Take 1 tablet (600 mg total) by mouth 4 (four) times daily for 30 days 120 tablet 0  . metoprolol  tartrate (LOPRESSOR ) 25 MG tablet Take 1 tablet by mouth once daily (Patient not taking: Reported on 11/15/2023)     No facility-administered medications prior to visit.    Allergies  Allergen Reactions  . Metoclopramide Other (See Comments) and Hives    hallucinations hallucinations hallucinations   . Nsaids (Non-Steroidal Anti-Inflammatory Drug) Other (See Comments) and Rash    Other Reaction: Allergy Rash/flares asthma issues.  . Vanilla Butternut Flavor(Bulk) Anaphylaxis    anaphylaxis  . Aspirin Unknown  . Azithromycin  Unknown  . Chlordiazepoxide Unknown  . Cyclobenzaprine  Hallucination    hallucinations  . Iron  Unknown  . Metoclopramide Unknown  . Morphine Other (See Comments)    Unsure of reaction Unsure of reaction   . Reglan [Metoclopramide Hcl] Hallucination  . Sulfa (Sulfonamide Antibiotics) Unknown  . Amlodipine  Besylate Rash    Rash , itching , arms, stomach and forehead Rash , itching , arms, stomach and forehead   . Iron  Nausea And Vomiting and Other (See Comments)    Other reaction(s): Unknown Other reaction(s): Unknown     Family History  Problem Relation Name Age of Onset  . Stroke Mother    . Diabetes Father    . Angina Father      Social History   Tobacco Use  . Smoking status: Never    Passive exposure: Never  . Smokeless tobacco: Never  Vaping Use  . Vaping  status: Never Used  Substance Use Topics  . Alcohol use: No    Alcohol/week: 0.0 standard drinks of alcohol  . Drug use: No      Review of Systems  Pertinent positive and negative ROS as documented in HPI.     Objective:   Blood pressure 108/56, pulse 99, temperature 36.2 C (97.1 F), temperature source Oral, height 151.1 cm (4' 11.5), weight 52.3 kg (115 lb 3.2 oz), SpO2 96%.  Physical Exam Vitals and nursing note reviewed.  Constitutional:      General: She is not in acute distress.    Appearance: Normal appearance. She is not ill-appearing, toxic-appearing or diaphoretic.  HENT:     Head: Normocephalic and atraumatic.  Eyes:     General: No scleral icterus.       Right eye: No discharge.        Left eye: No discharge.     Conjunctiva/sclera: Conjunctivae normal.  Cardiovascular:     Rate and Rhythm: Normal rate and regular rhythm.     Heart sounds: Normal heart sounds.  Pulmonary:     Effort: Pulmonary effort is normal. No respiratory distress.     Breath sounds: Normal breath sounds. No wheezing or rales.     Comments: Respiratory exam as documented. Musculoskeletal:     Cervical back: Normal range of motion.     Comments: Left knee: well healed scar noted to anterior aspect of knee. There is swelling noted. Mild warmth, but is similar in comparison to right. No appreciable erythema, ecchymosis. TTP to anterior inferior medial aspect of knee. No other direct bony TTP. No posterior calf tenderness. ROM exam somewhat limited to due to pain. Not able to fully extend due to pain. ?slight weakness compared to right lower extremity with regard to testing. Sensation to light touch grossly intact.  Skin:    General: Skin is warm and dry.  Neurological:     Mental Status: She is alert.  Psychiatric:        Mood and Affect: Mood normal.        Behavior: Behavior normal.     NOTE: I have reviewed x-ray prior to radiology review of left knee; no obvious acute abnormality noted on my review, hardware intact. Will await radiology review    Assessment/Plan:   1. Acute pain of left knee -     X-ray knee left 4 plus views; Future -     HYDROcodone -acetaminophen  (NORCO) 5-325 mg tablet;  Take one tablet at night for pain; may take up to every 8 hours as needed for pain if not working or driving  Dispense: 15 tablet; Refill: 0 -     predniSONE  (DELTASONE ) 20 MG tablet; Take 1 tablet (20 mg total) by mouth once daily for 5 days  Dispense: 5 tablet; Refill: 0  Patient is pleasant, cooperative, in no apparent distress. Vitals are okay. Here with left medial knee pain and swelling. TTP to left anterior, medial inferior knee pain. ROM limited due to pain. ?decreased strength comparatively due to pain. NV intact. Obtained an x-ray, on my review, no obvious acute bony abnormality, hardware appears intact. Await radiology review. ?bursititis. Will rx prednisone  for pain/inflammation. Norco for severe pain - has tolerated this previously. Ice and elevate. Continue to use walker. Patient is scheduled already to see orthopedics Thursday. To ED if acutely worsening/uncontrolled pain.    Attestation Statement:   I personally performed the service, non-incident to. (WP)   ASHLEY BRIDGETTE POISSON, PA  Patient Instructions  X-ray report pending.  Please wear brace. Continue to use walker to ambulate. Cool compresses and elevate.  Prednisone  for inflammation. Tylenol  for mild to moderate pain.  Norco for severe pain. Take medications as directed. Use norco with caution; may cause drowsiness. Do not drive or work while taking. Do not take with other sedating medicines. Take mostly at night; may use during the day if not working. Return to care should your symptoms not improve, or worsen in any way.  Keep scheduled appointment with orthopedics on Thursday.  To ED if acutely worsening/uncontrolled pain.

## 2023-11-16 ENCOUNTER — Other Ambulatory Visit: Payer: Self-pay | Admitting: Infectious Diseases

## 2023-11-16 ENCOUNTER — Other Ambulatory Visit: Payer: Self-pay

## 2023-11-16 DIAGNOSIS — M4620 Osteomyelitis of vertebra, site unspecified: Secondary | ICD-10-CM

## 2023-11-16 DIAGNOSIS — R5383 Other fatigue: Secondary | ICD-10-CM

## 2023-11-17 ENCOUNTER — Other Ambulatory Visit: Payer: Self-pay | Admitting: Nurse Practitioner

## 2023-11-17 ENCOUNTER — Ambulatory Visit: Attending: Cardiology | Admitting: Cardiology

## 2023-11-17 ENCOUNTER — Other Ambulatory Visit
Admission: RE | Admit: 2023-11-17 | Discharge: 2023-11-17 | Disposition: A | Discharge status: Home / Self Care | Attending: Infectious Diseases | Admitting: Infectious Diseases

## 2023-11-17 ENCOUNTER — Encounter: Payer: Self-pay | Admitting: Cardiology

## 2023-11-17 ENCOUNTER — Ambulatory Visit

## 2023-11-17 ENCOUNTER — Other Ambulatory Visit: Payer: Self-pay

## 2023-11-17 VITALS — BP 100/62 | HR 70 | Ht 59.5 in | Wt 115.0 lb

## 2023-11-17 DIAGNOSIS — R5383 Other fatigue: Secondary | ICD-10-CM | POA: Diagnosis not present

## 2023-11-17 DIAGNOSIS — I10 Essential (primary) hypertension: Secondary | ICD-10-CM | POA: Diagnosis not present

## 2023-11-17 DIAGNOSIS — R002 Palpitations: Secondary | ICD-10-CM

## 2023-11-17 DIAGNOSIS — R55 Syncope and collapse: Secondary | ICD-10-CM

## 2023-11-17 DIAGNOSIS — R42 Dizziness and giddiness: Secondary | ICD-10-CM

## 2023-11-17 DIAGNOSIS — G2581 Restless legs syndrome: Secondary | ICD-10-CM

## 2023-11-17 DIAGNOSIS — R0602 Shortness of breath: Secondary | ICD-10-CM

## 2023-11-17 DIAGNOSIS — M4620 Osteomyelitis of vertebra, site unspecified: Secondary | ICD-10-CM | POA: Insufficient documentation

## 2023-11-17 DIAGNOSIS — I429 Cardiomyopathy, unspecified: Secondary | ICD-10-CM

## 2023-11-17 DIAGNOSIS — J45909 Unspecified asthma, uncomplicated: Secondary | ICD-10-CM

## 2023-11-17 DIAGNOSIS — R54 Age-related physical debility: Secondary | ICD-10-CM

## 2023-11-17 DIAGNOSIS — J449 Chronic obstructive pulmonary disease, unspecified: Secondary | ICD-10-CM

## 2023-11-17 LAB — SEDIMENTATION RATE: Sed Rate: 63 mm/h — ABNORMAL HIGH (ref 0–30)

## 2023-11-17 LAB — BASIC METABOLIC PANEL WITH GFR
Anion gap: 13 (ref 5–15)
BUN: 12 mg/dL (ref 8–23)
CO2: 27 mmol/L (ref 22–32)
Calcium: 8.8 mg/dL — ABNORMAL LOW (ref 8.9–10.3)
Chloride: 98 mmol/L (ref 98–111)
Creatinine, Ser: 0.5 mg/dL (ref 0.44–1.00)
GFR, Estimated: >60 mL/min (ref >60)
Glucose, Bld: 246 mg/dL — ABNORMAL HIGH (ref 70–99)
Potassium: 3.4 mmol/L — ABNORMAL LOW (ref 3.5–5.1)
Sodium: 138 mmol/L (ref 135–145)

## 2023-11-17 LAB — C-REACTIVE PROTEIN: CRP: 5.4 mg/dL — ABNORMAL HIGH (ref <1.0)

## 2023-11-17 LAB — CORTISOL-AM, BLOOD: Cortisol - AM: 7.8 ug/dL (ref 6.7–22.6)

## 2023-11-17 NOTE — Progress Notes (Signed)
 Cardiology Office Note   Date:  11/17/2023  ID:  ZARAH CARBON, DOB [birth year], MRN [id number] PCP: Liana Fish, NP  [facility] Providers Cardiologist:  None     History of Present Illness Summer Murphy is a 68 y.o. female with a past medical history of hypertension, prediabetes, obesity, chronic left bundle branch block, COPD/asthma, with chronic shortness of breath, heart murmur, palpitations, who is here today for follow-up.   Echocardiogram completed in January 2023 with a normal ejection fraction, no significant valvular heart disease was noted.  Cardiac imaging with a echo and EF of 45-50% with left-to-right shunt, moderate pulmonary hypertension, severely dilated left atrium, mild to moderate TR.  She did have surgery for recent cervical fusion.  She was hospitalized in February 2025 with sepsis after a fall.  She had MSSA bacteremia, abscess/discitis at the cervical fusion site.  Underwent drainage by neurosurgery and placed on antibiotics.  Had a PICC line inserted with antibiotics for 6 weeks until March 16.  [facility].   She was last seen in clinic 05/10/2023 by Dr. Gollan.  At that time she reports she was not eating well with no appetite.  She was working on leg strength rehab.  She denied any significant shortness of breath or chest pain on exertion when working with PT.  She continues shortness of breath likely secondary to deconditioning, asthma/COPD, and the discussion was had that she can have ischemic workup done with a Lexiscan  Myoview  or a cardiac CTA.  There were no medication changes that were made.  And no further testing that was ordered.  She was evaluated in the [facility] emergency department 11/12/2023 of after feeling ill in general and lightheaded.  She was followed by ID and had an appointment in clinic yesterday but had to be taken off of antibiotics due to her persistent diarrhea.  Blood pressure was noted to be 116/64,  pulse of 92, respirations of 18, temperature of 98.  Labs were reassuring and stable.  No laboratories suggestive of volume depletion/dehydration or acute kidney injury vitals are stable and reassuring.  There was no acute abnormality of the head CT.  Patient will be discharged home with follow-up.  She returns to clinic today accompanied by a friend.  She states that she has been suffering from weakness, lightheadedness, dizziness, near syncope, and palpitations.  She denies any chest pain but continues to have occasional shortness of breath.  She states that she has had an extensive hospitalization and has lost weight and continues to not be able to eat.  She does have follow-up with infectious disease this afternoon to determine if her current antibiotic therapy is the correct regimen she should be on.  She was advised by EMS on the way to the emergency department for her chronic left bundle branch block and PACs noted on EKG that she needed to follow-up with cardiology.  She states that she has been compliant with her current medication regimen.  She has not missed any apixaban  and denies any blood in her stool or urine even though she has been told she is anemic.  ROS: 10 point review of systems has been reviewed and considered negative exception was been listed in the HPI  Studies Reviewed EKG Interpretation Date/Time:  Wednesday November 17 2023 10:46:11 EDT Ventricular Rate:  70 PR Interval:  154 QRS Duration:  134 QT Interval:  398 QTC Calculation: 429 R Axis:   -21  Text Interpretation: Sinus  rhythm with Premature atrial complexes Left bundle branch block When compared with ECG of 18-Jun-2023 11:34, PREVIOUS ECG IS PRESENT No significant change since last tracing Confirmed by Gerard Frederick ([id number]) on 11/17/2023 10:52:54 AM    2D echo 04/18/2023 1. Left ventricular ejection fraction, by estimation, is 45 to 50%. The  left ventricle has mildly decreased function. The left ventricle   demonstrates regional wall motion abnormalities (see scoring  diagram/findings for description). Left ventricular  diastolic parameters are consistent with Grade I diastolic dysfunction  (impaired relaxation).   2. Right ventricular systolic function is normal. The right ventricular  size is normal. There is moderately elevated pulmonary artery systolic  pressure.   3. Left atrial size was severely dilated.   4. The mitral valve is normal in structure. Mild to moderate mitral valve  regurgitation. No evidence of mitral stenosis.   5. Tricuspid valve regurgitation is mild to moderate.   6. The aortic valve is tricuspid. Aortic valve regurgitation is not  visualized. No aortic stenosis is present.   7. The inferior vena cava is dilated in size with <50% respiratory  variability, suggesting right atrial pressure of 15 mmHg.   Lexiscan  MPI 06/27/2021   Findings are consistent with no prior ischemia. The study is low risk.   No ST deviation was noted.   LV perfusion is equivocal. There is no evidence of ischemia. There is no evidence of infarction.   Left ventricular function is normal. End diastolic cavity size is normal. End systolic cavity size is normal.   There is a fixed septal wall defect likely due to left bundle branch block artifact.  The study is suboptimal due to this as well as extracardiac uptake.   CT attenuation images with no significant coronary calcifications  Risk Assessment/Calculations           Physical Exam VS:  BP 100/62 (BP Location: Left Arm, Patient Position: Sitting, Cuff Size: Normal)   Pulse 70   Ht 4' 11.5 (1.511 m)   Wt 115 lb (52.2 kg)   SpO2 95%   BMI 22.84 kg/m   Orthostatic VS for the past 24 hrs (Last 3 readings):  BP- Lying Pulse- Lying BP- Sitting Pulse- Sitting BP- Standing at 0 minutes Pulse- Standing at 0 minutes BP- Standing at 3 minutes Pulse- Standing at 3 minutes  11/17/23 1047 100/50 75 104/60 79 109/62 102 111/68 101      Wt  Readings from Last 3 Encounters:  11/17/23 115 lb (52.2 kg)  11/11/23 117 lb (53.1 kg)  11/11/23 117 lb (53.1 kg)    GEN: Frail, chronically ill-appearing, in no acute distress NECK: No JVD; No carotid bruits CARDIAC: RRR, no murmurs, rubs, gallops RESPIRATORY:  Clear to auscultation without rales, wheezing or rhonchi  ABDOMEN: Soft, non-tender, non-distended EXTREMITIES:  No edema; No deformity   ASSESSMENT AND PLAN Palpitations that have been worsening over the last couple of weeks.  She has noted to have large quantity of PACs on EKG today along with her chronic left bundle branch block but she is rate controlled at 70 with no ischemic changes noted.  She denies any increased amount of caffeine .  She is anemic.  And is recently been hospitalized for sepsis.  She has been placed on a ZIO XT monitor for 14 days for her palpitations.  Near syncope with chronic lightheadedness and dizziness.  She has been hypotensive and all of her antihypertensive medications have been discontinued.  She has found to be without orthostasis.  She is being placed on a ZIO XT monitor to rule out arrhythmia.  She is encouraged to maintain adequate hydration.  She also has follow-up with infectious disease and is encouraged to follow-up with her PCP about ongoing anemia.  Chronic shortness of breath that she states is unchanged and is still mild and stable likely secondary to deconditioning but with recurrent hospitalizations for sepsis and MSSA bacteremia she has been scheduled for a limited echocardiogram to ensure that she has not had a change in function.  Cardiomyopathy with an ejection fraction estimated 45-50%, septal wall motion hypokinesis likely secondary to chronic bundle branch block noted on prior study.  She would like to have repeat echocardiogram completed prior to any other study.  If she continues with shortness of breath or chest discomfort can consider Lexiscan  Myoview  versus a cardiac CTA for  completion of ischemic workup.  Due to recent hospitalization with hypotension from bacteremia all of antihypertensive medications and beta-blocker therapy have been discontinued.  Asthma/COPD which likely contributes to mild shortness of breath.  She states that her symptoms have been stable.  Frailty where with recent hospitalization she is deconditioned and weak.  She is trying to increase her activity and has been working on increasing her appetite and while she has family visiting and staying with her they state that she is eating 3 times a day.       Dispo: Patient to return to clinic to see MD/APP in 6 weeks or sooner if needed for further evaluation after testing is completed.  Signed, Cordell Coke, NP

## 2023-11-17 NOTE — Patient Instructions (Signed)
 Medication Instructions:  Your physician recommends that you continue on your current medications as directed. Please refer to the Current Medication list given to you today.   *If you need a refill on your cardiac medications before your next appointment, please call your pharmacy*  Lab Work: None ordered at this time  If you have labs (blood work) drawn today and your tests are completely normal, you will receive your results only by: MyChart Message (if you have MyChart) OR A paper copy in the mail If you have any lab test that is abnormal or we need to change your treatment, we will call you to review the results.  Testing/Procedures: Your physician has requested that you have an echocardiogram. Echocardiography is a painless test that uses sound waves to create images of your heart. It provides your doctor with information about the size and shape of your heart and how well your heart's chambers and valves are working.   You may receive an ultrasound enhancing agent through an IV if needed to better visualize your heart during the echo. This procedure takes approximately one hour.  There are no restrictions for this procedure.  This will take place at [address] [facility] [address], [place] [address]  Please note: We ask at that you not bring children with you during ultrasound (echo/ vascular) testing. Due to room size and safety concerns, children are not allowed in the ultrasound rooms during exams. Our front office staff cannot provide observation of children in our lobby area while testing is being conducted. An adult accompanying a patient to their appointment will only be allowed in the ultrasound room at the discretion of the ultrasound technician under special circumstances. We apologize for any inconvenience.   Your physician has recommended that you wear a Zio monitor.   This monitor is a medical device that records the heart's electrical activity. Doctors  most often use these monitors to diagnose arrhythmias. Arrhythmias are problems with the speed or rhythm of the heartbeat. The monitor is a small device applied to your chest. You can wear one while you do your normal daily activities. While wearing this monitor if you have any symptoms to push the button and record what you felt. Once you have worn this monitor for the period of time provider prescribed (Usually 14 days), you will return the monitor device in the postage paid box. Once it is returned they will download the data collected and provide us  with a report which the provider will then review and we will call you with those results. Important tips:  Avoid showering during the first 24 hours of wearing the monitor. Avoid excessive sweating to help maximize wear time. Do not submerge the device, no hot tubs, and no swimming pools. Keep any lotions or oils away from the patch. After 24 hours you may shower with the patch on. Take brief showers with your back facing the shower head.  Do not remove patch once it has been placed because that will interrupt data and decrease adhesive wear time. Push the button when you have any symptoms and write down what you were feeling. Once you have completed wearing your monitor, remove and place into box which has postage paid and place in your outgoing mailbox.  If for some reason you have misplaced your box then call our office and we can provide another box and/or mail it off for you.   Follow-Up: At [facility], you and your health needs are  our priority.  As part of our continuing mission to provide you with exceptional heart care, our providers are all part of one team.  This team includes your primary Cardiologist (physician) and Advanced Practice Providers or APPs (Physician Assistants and Nurse Practitioners) who all work together to provide you with the care you need, when you need it.  Your next appointment:   6 week(s)  Provider:    Tylene Lunch, NP    We recommend signing up for the patient portal called MyChart.  Sign up information is provided on this After Visit Summary.  MyChart is used to connect with patients for Virtual Visits (Telemedicine).  Patients are able to view lab/test results, encounter notes, upcoming appointments, etc.  Non-urgent messages can be sent to your provider as well.   To learn more about what you can do with MyChart, go to [email or web address].

## 2023-11-18 LAB — ACTH: C206 ACTH: 1.6 pg/mL — ABNORMAL LOW (ref 7.2–63.3)

## 2023-11-23 ENCOUNTER — Ambulatory Visit: Payer: Self-pay

## 2023-11-23 ENCOUNTER — Ambulatory Visit: Admitting: Dietician

## 2023-11-23 NOTE — Telephone Encounter (Signed)
 FYI Only or Action Required?: Action required by provider: Advised urgent care if symptoms worsen.  Patient was last seen in primary care on NA.  Called Nurse Triage reporting Dizziness.  Symptoms began today.  Interventions attempted: Other: ate food.  Symptoms are: unchanged.  Triage Disposition: See Physician Within 24 Hours  Patient/caregiver understands and will follow disposition?:         Copied from CRM #[id number]. Topic: Clinical - Red Word Triage >> Nov 23, 2023  5:12 PM Mercer PEDLAR wrote: Red Word that prompted transfer to Nurse Triage: High blood sugar 177, not feeling good. Reason for Disposition  Taking a medicine that could cause dizziness (e.g., blood pressure medications, diuretics)    Patient denies dizziness. Pt reports not feeling well.  Answer Assessment - Initial Assessment Questions Advised UC/ED, office closed. Patient reports I won't be able to get there. Advised call 911 if symptoms worsen. Patient verbalized understanding.  Patient reports BP 104/62, hr-unknown , bs 177; not sure of baseline of bp or bs, don't check every day.   This RN instructed patient to re check, BP 101/62, hr 103, blood sugar 136.  Denies HA, dizziness, faint, numbness/weakness, chest pain, diff breathing, n/v/d and pain.  Patient reports I don't feel well. I been having this crazy feeling for a long time.  Patient reports start feeling bad, ate food, still feeling bad.  BP Diltiazem  today, No medications for diabetic  1. DESCRIPTION: Describe your dizziness.     Denies dizziness 2. LIGHTHEADED: Do you feel lightheaded? (e.g., somewhat faint, woozy, weak upon standing)     It's not like I'm going to pass out, I just feel off 3. VERTIGO: Do you feel like either you or the room is spinning or tilting? (i.e., vertigo)     no 4. SEVERITY: How bad is it?  Do you feel like you are going to faint? Can you stand and walk?     yes 5. ONSET:  When did the dizziness  begin?     Unwell feeling started today, tried to eat to see if helped 6. AGGRAVATING FACTORS: Does anything make it worse? (e.g., standing, change in head position)     unsure 7. HEART RATE: Can you tell me your heart rate? How many beats in 15 seconds?  (Note: Not all patients can do this.)       Did not check  8. CAUSE: What do you think is causing the dizziness? (e.g., decreased fluids or food, diarrhea, emotional distress, heat exposure, new medicine, sudden standing, vomiting; unknown)     Denies chills, fever, n/v/d,  Reports inflammed tendon in left leg, dr appt last Thursday New oxycodone , been taking since Thursday; around 330PM  9. RECURRENT SYMPTOM: Have you had dizziness before? If Yes, ask: When was the last time? What happened that time?     Felt like before, not dizzy  10. OTHER SYMPTOMS: Do you have any other symptoms? (e.g., fever, chest pain, vomiting, diarrhea, bleeding)       none  Protocols used: Dizziness - Lightheadedness-A-AH

## 2023-11-26 ENCOUNTER — Other Ambulatory Visit: Payer: Self-pay

## 2023-11-26 ENCOUNTER — Inpatient Hospital Stay

## 2023-11-26 ENCOUNTER — Inpatient Hospital Stay: Admission: EM | Admit: 2023-11-26 | Discharge: 2023-11-30 | DRG: 335 | Disposition: A | Discharge status: Home Health

## 2023-11-26 ENCOUNTER — Emergency Department

## 2023-11-26 DIAGNOSIS — E1142 Type 2 diabetes mellitus with diabetic polyneuropathy: Secondary | ICD-10-CM | POA: Diagnosis present

## 2023-11-26 DIAGNOSIS — Z7901 Long term (current) use of anticoagulants: Secondary | ICD-10-CM | POA: Diagnosis not present

## 2023-11-26 DIAGNOSIS — Z87442 Personal history of urinary calculi: Secondary | ICD-10-CM

## 2023-11-26 DIAGNOSIS — J4489 Other specified chronic obstructive pulmonary disease: Secondary | ICD-10-CM | POA: Diagnosis present

## 2023-11-26 DIAGNOSIS — I82220 Acute embolism and thrombosis of inferior vena cava: Secondary | ICD-10-CM | POA: Diagnosis present

## 2023-11-26 DIAGNOSIS — G2581 Restless legs syndrome: Secondary | ICD-10-CM | POA: Diagnosis present

## 2023-11-26 DIAGNOSIS — K219 Gastro-esophageal reflux disease without esophagitis: Secondary | ICD-10-CM | POA: Diagnosis present

## 2023-11-26 DIAGNOSIS — F32A Depression, unspecified: Secondary | ICD-10-CM | POA: Diagnosis present

## 2023-11-26 DIAGNOSIS — Z885 Allergy status to narcotic agent status: Secondary | ICD-10-CM

## 2023-11-26 DIAGNOSIS — E871 Hypo-osmolality and hyponatremia: Secondary | ICD-10-CM | POA: Diagnosis present

## 2023-11-26 DIAGNOSIS — Z881 Allergy status to other antibiotic agents status: Secondary | ICD-10-CM

## 2023-11-26 DIAGNOSIS — J449 Chronic obstructive pulmonary disease, unspecified: Secondary | ICD-10-CM | POA: Diagnosis present

## 2023-11-26 DIAGNOSIS — Z8249 Family history of ischemic heart disease and other diseases of the circulatory system: Secondary | ICD-10-CM

## 2023-11-26 DIAGNOSIS — Z886 Allergy status to analgesic agent status: Secondary | ICD-10-CM | POA: Diagnosis not present

## 2023-11-26 DIAGNOSIS — E876 Hypokalemia: Secondary | ICD-10-CM | POA: Diagnosis present

## 2023-11-26 DIAGNOSIS — Z947 Corneal transplant status: Secondary | ICD-10-CM

## 2023-11-26 DIAGNOSIS — I447 Left bundle-branch block, unspecified: Secondary | ICD-10-CM | POA: Diagnosis present

## 2023-11-26 DIAGNOSIS — Z91018 Allergy to other foods: Secondary | ICD-10-CM

## 2023-11-26 DIAGNOSIS — Z8701 Personal history of pneumonia (recurrent): Secondary | ICD-10-CM

## 2023-11-26 DIAGNOSIS — Z7983 Long term (current) use of bisphosphonates: Secondary | ICD-10-CM

## 2023-11-26 DIAGNOSIS — K449 Diaphragmatic hernia without obstruction or gangrene: Secondary | ICD-10-CM | POA: Diagnosis present

## 2023-11-26 DIAGNOSIS — Z89422 Acquired absence of other left toe(s): Secondary | ICD-10-CM

## 2023-11-26 DIAGNOSIS — I10 Essential (primary) hypertension: Secondary | ICD-10-CM | POA: Diagnosis present

## 2023-11-26 DIAGNOSIS — Z90710 Acquired absence of both cervix and uterus: Secondary | ICD-10-CM

## 2023-11-26 DIAGNOSIS — Z86718 Personal history of other venous thrombosis and embolism: Secondary | ICD-10-CM

## 2023-11-26 DIAGNOSIS — M8580 Other specified disorders of bone density and structure, unspecified site: Secondary | ICD-10-CM | POA: Diagnosis present

## 2023-11-26 DIAGNOSIS — Z79899 Other long term (current) drug therapy: Secondary | ICD-10-CM

## 2023-11-26 DIAGNOSIS — D72829 Elevated white blood cell count, unspecified: Secondary | ICD-10-CM | POA: Diagnosis present

## 2023-11-26 DIAGNOSIS — G63 Polyneuropathy in diseases classified elsewhere: Secondary | ICD-10-CM | POA: Diagnosis present

## 2023-11-26 DIAGNOSIS — E119 Type 2 diabetes mellitus without complications: Secondary | ICD-10-CM

## 2023-11-26 DIAGNOSIS — Z8619 Personal history of other infectious and parasitic diseases: Secondary | ICD-10-CM

## 2023-11-26 DIAGNOSIS — Z888 Allergy status to other drugs, medicaments and biological substances status: Secondary | ICD-10-CM

## 2023-11-26 DIAGNOSIS — R1033 Periumbilical pain: Secondary | ICD-10-CM | POA: Diagnosis present

## 2023-11-26 DIAGNOSIS — D649 Anemia, unspecified: Secondary | ICD-10-CM | POA: Diagnosis present

## 2023-11-26 DIAGNOSIS — K56609 Unspecified intestinal obstruction, unspecified as to partial versus complete obstruction: Principal | ICD-10-CM | POA: Diagnosis present

## 2023-11-26 DIAGNOSIS — Z833 Family history of diabetes mellitus: Secondary | ICD-10-CM | POA: Diagnosis not present

## 2023-11-26 DIAGNOSIS — M1990 Unspecified osteoarthritis, unspecified site: Secondary | ICD-10-CM | POA: Diagnosis present

## 2023-11-26 DIAGNOSIS — F411 Generalized anxiety disorder: Secondary | ICD-10-CM | POA: Diagnosis present

## 2023-11-26 DIAGNOSIS — K5651 Intestinal adhesions [bands], with partial obstruction: Principal | ICD-10-CM | POA: Diagnosis present

## 2023-11-26 DIAGNOSIS — K458 Other specified abdominal hernia without obstruction or gangrene: Secondary | ICD-10-CM

## 2023-11-26 DIAGNOSIS — G894 Chronic pain syndrome: Secondary | ICD-10-CM | POA: Diagnosis present

## 2023-11-26 DIAGNOSIS — K2100 Gastro-esophageal reflux disease with esophagitis, without bleeding: Secondary | ICD-10-CM

## 2023-11-26 DIAGNOSIS — G4700 Insomnia, unspecified: Secondary | ICD-10-CM | POA: Diagnosis present

## 2023-11-26 DIAGNOSIS — Z7951 Long term (current) use of inhaled steroids: Secondary | ICD-10-CM

## 2023-11-26 DIAGNOSIS — F419 Anxiety disorder, unspecified: Secondary | ICD-10-CM | POA: Diagnosis present

## 2023-11-26 DIAGNOSIS — Z882 Allergy status to sulfonamides status: Secondary | ICD-10-CM

## 2023-11-26 DIAGNOSIS — Z79891 Long term (current) use of opiate analgesic: Secondary | ICD-10-CM

## 2023-11-26 DIAGNOSIS — K450 Other specified abdominal hernia with obstruction, without gangrene: Secondary | ICD-10-CM | POA: Diagnosis not present

## 2023-11-26 LAB — COMPREHENSIVE METABOLIC PANEL WITH GFR
ALT: 17 U/L (ref 0–44)
AST: 23 U/L (ref 15–41)
Albumin: 2.7 g/dL — ABNORMAL LOW (ref 3.5–5.0)
Alkaline Phosphatase: 74 U/L (ref 38–126)
Anion gap: 14 (ref 5–15)
BUN: 11 mg/dL (ref 8–23)
CO2: 26 mmol/L (ref 22–32)
Calcium: 8.5 mg/dL — ABNORMAL LOW (ref 8.9–10.3)
Chloride: 94 mmol/L — ABNORMAL LOW (ref 98–111)
Creatinine, Ser: 0.62 mg/dL (ref 0.44–1.00)
GFR, Estimated: >60 mL/min (ref >60)
Glucose, Bld: 145 mg/dL — ABNORMAL HIGH (ref 70–99)
Potassium: 3.1 mmol/L — ABNORMAL LOW (ref 3.5–5.1)
Sodium: 134 mmol/L — ABNORMAL LOW (ref 135–145)
Total Bilirubin: 0.6 mg/dL (ref 0.0–1.2)
Total Protein: 6.9 g/dL (ref 6.5–8.1)

## 2023-11-26 LAB — CBC
HCT: 29 % — ABNORMAL LOW (ref 36.0–46.0)
Hemoglobin: 9.2 g/dL — ABNORMAL LOW (ref 12.0–15.0)
MCH: 25.6 pg — ABNORMAL LOW (ref 26.0–34.0)
MCHC: 31.7 g/dL (ref 30.0–36.0)
MCV: 80.8 fL (ref 80.0–100.0)
Platelets: 567 K/uL — ABNORMAL HIGH (ref 150–400)
RBC: 3.59 MIL/uL — ABNORMAL LOW (ref 3.87–5.11)
RDW: 15.3 % (ref 11.5–15.5)
WBC: 11.7 K/uL — ABNORMAL HIGH (ref 4.0–10.5)
nRBC: 0 % (ref 0.0–0.2)

## 2023-11-26 LAB — TROPONIN I (HIGH SENSITIVITY)
Troponin I (High Sensitivity): 6 ng/L (ref <18)
Troponin I (High Sensitivity): 6 ng/L (ref <18)

## 2023-11-26 LAB — LIPASE, BLOOD: Lipase: 19 U/L (ref 11–51)

## 2023-11-26 LAB — LACTIC ACID, PLASMA: Lactic Acid, Venous: 0.7 mmol/L (ref 0.5–1.9)

## 2023-11-26 LAB — GLUCOSE, CAPILLARY: Glucose-Capillary: 137 mg/dL — ABNORMAL HIGH (ref 70–99)

## 2023-11-26 MED ORDER — CEFAZOLIN SODIUM-DEXTROSE 2-4 GM/100ML-% IV SOLN
2.0000 g | Freq: Three times a day (TID) | INTRAVENOUS | Status: DC
  Administered 2023-11-27 – 2023-11-29 (×6): 2 g via INTRAVENOUS
  Filled 2023-11-26 (×6): qty 100

## 2023-11-26 MED ORDER — INSULIN ASPART 100 UNIT/ML IJ SOLN: 0.0000 [IU] | Freq: Every day | INTRAMUSCULAR | Status: DC

## 2023-11-26 MED ORDER — METRONIDAZOLE 500 MG/100ML IV SOLN
500.0000 mg | Freq: Once | INTRAVENOUS | Status: AC
Start: 2023-11-27 — End: 2023-11-27
  Administered 2023-11-27: 500 mg via INTRAVENOUS
  Filled 2023-11-26: qty 100

## 2023-11-26 MED ORDER — METRONIDAZOLE 500 MG/100ML IV SOLN
500.0000 mg | Freq: Two times a day (BID) | INTRAVENOUS | Status: DC
  Administered 2023-11-27 – 2023-11-28 (×4): 500 mg via INTRAVENOUS
  Filled 2023-11-26 (×5): qty 100

## 2023-11-26 MED ORDER — ONDANSETRON HCL 4 MG/2ML IJ SOLN
4.0000 mg | Freq: Once | INTRAMUSCULAR | Status: AC
  Administered 2023-11-26: 4 mg via INTRAVENOUS
  Filled 2023-11-26: qty 2

## 2023-11-26 MED ORDER — POTASSIUM CHLORIDE 2 MEQ/ML IV SOLN
INTRAVENOUS | Status: AC
  Filled 2023-11-26 (×3): qty 1000

## 2023-11-26 MED ORDER — SODIUM CHLORIDE 0.9 % IV BOLUS
1000.0000 mL | Freq: Once | INTRAVENOUS | Status: AC
  Administered 2023-11-26: 1000 mL via INTRAVENOUS

## 2023-11-26 MED ORDER — HYDROMORPHONE HCL 1 MG/ML IJ SOLN
0.5000 mg | INTRAMUSCULAR | Status: DC | PRN
  Administered 2023-11-26 – 2023-11-28 (×5): 0.5 mg via INTRAVENOUS
  Filled 2023-11-26 (×5): qty 0.5

## 2023-11-26 MED ORDER — INSULIN ASPART 100 UNIT/ML IJ SOLN
0.0000 [IU] | Freq: Three times a day (TID) | INTRAMUSCULAR | Status: DC
  Administered 2023-11-27: 2 [IU] via SUBCUTANEOUS
  Administered 2023-11-30: 1 [IU] via SUBCUTANEOUS
  Filled 2023-11-26 (×2): qty 1

## 2023-11-26 MED ORDER — PANTOPRAZOLE SODIUM 40 MG IV SOLR
40.0000 mg | INTRAVENOUS | Status: DC
  Administered 2023-11-26 – 2023-11-28 (×3): 40 mg via INTRAVENOUS
  Filled 2023-11-26 (×3): qty 10

## 2023-11-26 MED ORDER — SODIUM CHLORIDE 0.9 % IV SOLN: Freq: Once | INTRAVENOUS | Status: AC

## 2023-11-26 MED ORDER — HYDROMORPHONE HCL 1 MG/ML IJ SOLN
0.5000 mg | Freq: Once | INTRAMUSCULAR | Status: AC
  Administered 2023-11-26: 0.5 mg via INTRAVENOUS
  Filled 2023-11-26: qty 0.5

## 2023-11-26 MED ORDER — HYDROMORPHONE HCL 1 MG/ML IJ SOLN
1.0000 mg | Freq: Once | INTRAMUSCULAR | Status: AC
  Administered 2023-11-26: 1 mg via INTRAVENOUS
  Filled 2023-11-26: qty 1

## 2023-11-26 MED ORDER — CEFAZOLIN SODIUM-DEXTROSE 2-4 GM/100ML-% IV SOLN
2.0000 g | Freq: Once | INTRAVENOUS | Status: AC
Start: 2023-11-27 — End: 2023-11-27
  Administered 2023-11-27: 2 g via INTRAVENOUS
  Filled 2023-11-26: qty 100

## 2023-11-26 MED ORDER — IOHEXOL 300 MG/ML  SOLN
100.0000 mL | Freq: Once | INTRAMUSCULAR | Status: AC | PRN
  Administered 2023-11-26: 100 mL via INTRAVENOUS

## 2023-11-26 MED ORDER — ONDANSETRON HCL 4 MG PO TABS: 4.0000 mg | ORAL_TABLET | Freq: Four times a day (QID) | ORAL | Status: DC | PRN

## 2023-11-26 MED ORDER — ONDANSETRON HCL 4 MG/2ML IJ SOLN
4.0000 mg | Freq: Four times a day (QID) | INTRAMUSCULAR | Status: DC | PRN
  Administered 2023-11-27 – 2023-11-28 (×3): 4 mg via INTRAVENOUS
  Filled 2023-11-26 (×2): qty 2

## 2023-11-26 NOTE — Consult Note (Incomplete Revision)
 [name] SURGICAL CONSULTATION NOTE (initial) - cpt: 99255   HISTORY OF PRESENT ILLNESS (HPI):  68 y.o. female presented to [facility] [place] ED today for evaluation of abdominal pain, abnormal CT scan. Patient reports acute onset of lower abdominal pain beginning at 4 PM this afternoon.  She reports she felt well, in her usual state earlier and throughout the day.  She had her last loose stool this morning which was not unusual for her.  She ate a sausage biscuit or part of 1, she vomited after the onset of the pain, thought it was mostly the food she had previously eaten.  Not described as bilious or significant volume.  She presented to the ED with a 10 out of 10 pain which is improved to 5 out of 10 at 1 point in time following Dilaudid , but is currently back to 10 out of 10.  She is writhing about clearly uncomfortable.  Unable to find a position that helps.  She denies any radiation of the pain, she denies any waxing or waning nature of it.  No known fevers or chills.  No recent flatus that is known.  She reports a history of 2 esophageal wraps/hiatal hernia repair, open cholecystectomy, hysterectomy and appendectomy.  Surgery is consulted by ED physician Dr. Dorothyann in this context for evaluation and management of abdominal pain with the associated CT findings as noted below.  I have reviewed her imaging, and concur nothing is definitive.    PAST MEDICAL HISTORY (PMH):  Past Medical History:  Diagnosis Date  . Acid reflux   . Anemia   . Anxiety    a.) buspirone  BID + BZO PRN (clonazepam )  . Arthritis   . Aspiration pneumonia (HCC) 06/02/2022  . Asthma   . CAP (community acquired pneumonia) 04/24/2022  . Cervical disc disorder with radiculopathy of cervical region   . Chronic pain syndrome    a.) on COT as prescribed by pain mangement  . Chronic, continuous use of opioids    a.) followed by pain mangement  . COPD (chronic obstructive pulmonary disease) (HCC)   . DOE  (dyspnea on exertion)   . Hematuria   . Hiatal hernia   . History of kidney stones   . Hypertension   . Hypertensive urgency 05/12/2022  . Hypoglycemia   . Insomnia    a.) uses Trazodone  PRN  . LBBB (left bundle branch block)   . Migraine   . MSSA bacteremia 04/19/2023  . Murmur   . Osteopenia   . Palpitations   . Pre-diabetes   . Restless leg    a.) on ropinirole   . Sepsis (HCC) 05/12/2022  . Stenosis of cervical spine with myelopathy (HCC)   . T2DM (type 2 diabetes mellitus) (HCC)      PAST SURGICAL HISTORY (PSH):  Past Surgical History:  Procedure Laterality Date  . ABDOMINAL HYSTERECTOMY    . AMPUTATION TOE Left 07/31/2016   Procedure: AMPUTATION TOE/MPJ 2nd toe;  Surgeon: Neill Boas, DPM;  Location: [facility] ORS;  Service: Podiatry;  Laterality: Left;  . APPENDECTOMY  1990  . BACK SURGERY     low back  . BREAST SURGERY     bilateral breast reduction  . CARDIAC ELECTROPHYSIOLOGY STUDY AND ABLATION    . CERVICAL WOUND DEBRIDEMENT N/A 04/17/2023   Procedure: CERVICAL WOUND DEBRIDEMENT;  Surgeon: Bluford Standing, MD;  Location: [facility] ORS;  Service: Neurosurgery;  Laterality: N/A;  . CHOLECYSTECTOMY  1990  . COLONOSCOPY WITH PROPOFOL  N/A 01/04/2017   Procedure:  COLONOSCOPY WITH PROPOFOL ;  Surgeon: Viktoria Lamar DASEN, MD;  Location: [facility] ENDOSCOPY;  Service: Endoscopy;  Laterality: N/A;  . CORNEAL TRANSPLANT    . ESOPHAGOGASTRODUODENOSCOPY N/A 04/09/2021   Procedure: ESOPHAGOGASTRODUODENOSCOPY (EGD);  Surgeon: Unk Corinn Skiff, MD;  Location: [facility] ENDOSCOPY;  Service: Gastroenterology;  Laterality: N/A;  . ESOPHAGOGASTRODUODENOSCOPY (EGD) WITH PROPOFOL  N/A 01/04/2017   Procedure: ESOPHAGOGASTRODUODENOSCOPY (EGD) WITH PROPOFOL ;  Surgeon: Viktoria Lamar DASEN, MD;  Location: [facility] ENDOSCOPY;  Service: Endoscopy;  Laterality: N/A;  . EXCISION BONE CYST Left 07/31/2016   Procedure: EXCISION BONE CYST/exostectomy 28124/left 2nd;  Surgeon: Neill Boas, DPM;  Location: [facility] ORS;  Service:  Podiatry;  Laterality: Left;  . EXTRACORPOREAL SHOCK WAVE LITHOTRIPSY Left 09/12/2015   Procedure: EXTRACORPOREAL SHOCK WAVE LITHOTRIPSY (ESWL);  Surgeon: Rosina Riis, MD;  Location: [facility] ORS;  Service: Urology;  Laterality: Left;  . FRACTURE SURGERY     left foot  . GUM SURGERY Left    gum infection  . HARDWARE REMOVAL Left 11/14/2020   Procedure: HARDWARE REMOVAL;  Surgeon: Kathlynn Sharper, MD;  Location: [facility] ORS;  Service: Orthopedics;  Laterality: Left;  . HH repair     Fundoplication  . JOINT REPLACEMENT Bilateral 2013,2014   total knees  . LAPAROSCOPIC HYSTERECTOMY    . LITHOTRIPSY    . periprosthetic supracondylar fracture of left femur  02/16/2020   [facility]  . POSTERIOR CERVICAL FUSION/FORAMINOTOMY N/A 03/02/2023   Procedure: C3-6 POSTERIOR CERVICAL LAMINECTOMY AND FUSION;  Surgeon: Claudene Riis ORN, MD;  Location: [facility] ORS;  Service: Neurosurgery;  Laterality: N/A;  . TONSILLECTOMY    . TOTAL KNEE REVISION Right 11/14/2019   Procedure: Revision patella and tibial polyethylene;  Surgeon: Kathlynn Sharper, MD;  Location: [facility] ORS;  Service: Orthopedics;  Laterality: Right;  . URETEROSCOPY       MEDICATIONS:  Prior to Admission medications   Medication Sig Start Date End Date Taking? Authorizing Provider  Accu-Chek Softclix Lancets lancets Use 1 lancet to check glucose once daily and as needed for prediabetes R73.03 10/29/23   Abernathy, Alyssa, NP  acetaminophen  (TYLENOL ) 500 MG tablet Take 2 tablets (1,000 mg total) by mouth every 6 (six) hours as needed. 03/22/23 03/21/24  Ulis Bottcher, PA-C  alendronate  (FOSAMAX ) 70 MG tablet TAKE 1 TABLET BY MOUTH WEEKLY  TAKE WITH A FULL GLASS OF WATER   ON AN EMPTY STOMACH 08/18/23   Liana Fish, NP  Blood Glucose Monitoring Suppl (ACCU-CHEK GUIDE ME) w/Device KIT Use device to check glucose daily and as need for prediabetes R73.03 10/29/23   Liana Fish, NP  busPIRone  (BUSPAR ) 10 MG tablet TAKE 1 TABLET(10 MG) BY MOUTH  TWICE DAILY 09/08/23   Liana Fish, NP  cefadroxil  (DURICEF) 500 MG capsule Take 2 capsules (1,000 mg total) by mouth 2 (two) times daily. 10/13/23   Fayette Bodily, MD  clonazePAM  (KLONOPIN ) 1 MG tablet Take 0.5-1 tablets (0.5-1 mg total) by mouth 2 (two) times daily as needed for anxiety. 06/15/23   Liana Fish, NP  COMBIVENT  RESPIMAT 20-100 MCG/ACT AERS respimat 1 puff every 6 (six) hours. 05/24/23   [provider]  diclofenac  Sodium (VOLTAREN ) 1 % GEL Apply 4 g topically 4 (four) times daily. 08/13/20   Khan, Fozia M, MD  DULoxetine  (CYMBALTA ) 60 MG capsule Take 1 capsule (60 mg total) by mouth at bedtime. 05/24/23   Liana Fish, NP  Dupilumab  (DUPIXENT ) 300 MG/2ML SOAJ Inject 300 mg into the skin every 14 (fourteen) days. **loading dose completed in office on 07/02/23** 07/02/23   Dgayli,  Khabib, MD  ELIQUIS  5 MG TABS tablet TAKE 1 TABLET(5 MG) BY MOUTH TWICE DAILY 10/15/23   Abernathy, Alyssa, NP  fluticasone -salmeterol (WIXELA INHUB) 250-50 MCG/ACT AEPB Inhale 1 puff into the lungs in the morning and at bedtime. 12/21/22   Isadora Hose, MD  gabapentin  (NEURONTIN ) 300 MG capsule Take 1 capsule (300 mg total) by mouth 2 (two) times daily. 09/06/23   Abernathy, Alyssa, NP  glucose blood (ACCU-CHEK GUIDE TEST) test strip Use 1 test strip to check glucose once daily and as needed for prediabetes R73.03 10/29/23   Abernathy, Alyssa, NP  HYDROcodone -acetaminophen  (NORCO/VICODIN) 5-325 MG tablet Take 1 tablet by mouth 2 (two) times daily as needed for severe pain (pain score 7-10). 09/06/23   Liana Fish, NP  ipratropium-albuterol  (DUONEB) 0.5-2.5 (3) MG/3ML SOLN Take 3 mLs by nebulization every 6 (six) hours as needed. 04/28/23   Lenon Marien CROME, MD  lidocaine  (LIDODERM ) 5 % Place 1 patch onto the skin daily. To affected area of the neck. Remove & Discard patch within 12 hours or as directed by MD 09/06/23   Liana Fish, NP  methocarbamol  (ROBAXIN ) 750 MG tablet Take  1 tablet (750 mg total) by mouth every 8 (eight) hours as needed for muscle spasms. 05/24/23   Liana Fish, NP  methylPREDNISolone  (MEDROL  DOSEPAK) 4 MG TBPK tablet Take as Directed, per package instructions 09/03/23   Claudene Penne ORN, MD  montelukast  (SINGULAIR ) 10 MG tablet TAKE 1 TABLET(10 MG) BY MOUTH AT BEDTIME 08/11/23   Abernathy, Alyssa, NP  ondansetron  (ZOFRAN ) 4 MG tablet TAKE 1 TABLET BY MOUTH TWICE DAILY AS NEEDED 09/06/23   Liana Fish, NP  oxybutynin  (DITROPAN -XL) 10 MG 24 hr tablet TAKE 1 TABLET(10 MG) BY MOUTH AT BEDTIME 09/14/23   Abernathy, Alyssa, NP  pantoprazole  (PROTONIX ) 40 MG tablet Take 1 tablet (40 mg total) by mouth 2 (two) times daily. 05/24/23   Abernathy, Alyssa, NP  polyethylene glycol (MIRALAX  / GLYCOLAX ) 17 g packet Take 17 g by mouth 2 (two) times daily. 04/28/23   Lenon Marien CROME, MD  potassium chloride  (KLOR-CON  10) 10 MEQ tablet Take 1 tablet (10 mEq total) by mouth daily. 10/29/23   Liana Fish, NP  rOPINIRole  (REQUIP ) 1 MG tablet TAKE 1 TABLET(1 MG) BY MOUTH AT BEDTIME 11/17/23   Liana Fish, NP  SUMAtriptan  (IMITREX ) 100 MG tablet Take 1 tablet (100 mg total) by mouth every 2 (two) hours as needed (for migraine headaches.). May repeat in 1 hours if headache persists or recurs. 07/08/23   Abernathy, Alyssa, NP  traZODone  (DESYREL ) 150 MG tablet TAKE 1 TABLET(150 MG) BY MOUTH AT BEDTIME 07/08/23   Liana Fish, NP     ALLERGIES:  Allergies  Allergen Reactions  . Librium [Chlordiazepoxide] Shortness Of Breath  . Reglan [Metoclopramide] Hives and Other (See Comments)    hallucinations   . Vanilla Shortness Of Breath    Pt reports allergy to vanilla extract only  . Aspirin Hives  . Nsaids Rash    Rash/flares asthma issues.  . Azithromycin  Other (See Comments)    Unsure of what the reaction was.  . Buprenorphine Hcl Other (See Comments)    Unsure of what the reaction was.   . Flexeril  [Cyclobenzaprine ]     hallucinations  . Morphine  Other (See Comments)    Unsure of reaction  . Sulfa Antibiotics     Other reaction(s): Unknown  . Tolmetin     Other Reaction: Allergy  . Amlodipine  Besylate Itching and Rash  arms, stomach and forehead  . Iron  Nausea And Vomiting     SOCIAL HISTORY:  Social History   Socioeconomic History  . Marital status: Widowed    Spouse name: Not on file  . Number of children: Not on file  . Years of education: Not on file  . Highest education level: Not on file  Occupational History  . Not on file  Tobacco Use  . Smoking status: Never    Passive exposure: Past  . Smokeless tobacco: Never  Vaping Use  . Vaping status: Never Used  Substance and Sexual Activity  . Alcohol use: No  . Drug use: No  . Sexual activity: Not Currently  Other Topics Concern  . Not on file  Social History Narrative   Lives at home alone. Relatives are the support person.    Social Drivers of Corporate investment banker Strain: Low Risk  (11/26/2023)   Received from [facility]   Overall Financial Resource Strain (CARDIA)   . Difficulty of Paying Living Expenses: Not hard at all  Food Insecurity: No Food Insecurity (11/26/2023)   Received from [facility]   Hunger Vital Sign   . Within the past 12 months, you worried that your food would run out before you got the money to buy more.: Never true   . Within the past 12 months, the food you bought just didn't last and you didn't have money to get more.: Never true  Transportation Needs: No Transportation Needs (11/26/2023)   Received from [facility]   [facility] - Transportation   . In the past 12 months, has lack of transportation kept you from medical appointments or from getting medications?: No   . Lack of Transportation (Non-Medical): No  Physical Activity: Not on file  Stress: Not on file  Social Connections: Moderately Isolated (04/16/2023)   Social Connection and Isolation Panel   . Frequency of  Communication with Friends and Family: More than three times a week   . Frequency of Social Gatherings with Friends and Family: Once a week   . Attends Religious Services: More than 4 times per year   . Active Member of Clubs or Organizations: No   . Attends Banker Meetings: Never   . Marital Status: Widowed  Intimate Partner Violence: Not At Risk (04/16/2023)   Humiliation, Afraid, Rape, and Kick questionnaire   . Fear of Current or Ex-Partner: No   . Emotionally Abused: No   . Physically Abused: No   . Sexually Abused: No     FAMILY HISTORY:  Family History  Problem Relation Age of Onset  . Hypertension Mother   . Stroke Mother   . Prostate cancer Father   . Kidney Stones Father   . Diabetes Brother   . Hypertension Brother   . Breast cancer Maternal Aunt   . Kidney disease Neg Hx       VITAL SIGNS:  Temp:  [99.1 F (37.3 C)] 99.1 F (37.3 C) (09/05 1722) Pulse Rate:  [99-100] 99 (09/05 1830) Resp:  [13-28] 13 (09/05 1830) BP: (123-131)/(70-111) 131/70 (09/05 1830) SpO2:  [100 %] 100 % (09/05 1830)     Height: 4' 11 (149.9 cm)       INTAKE/OUTPUT:  No intake/output data recorded.  PHYSICAL EXAM:  Physical Exam Blood pressure 131/70, pulse 99, temperature 99.1 F (37.3 C), resp. rate 13, height 4' 11 (1.499 m), SpO2 100%.   CONSTITUTIONAL: Edentulous frail chronic  ill-appearing female writhing about with dyskinetic activity, she reports secondary to her pain.  Distractible, quieting her writhing while discussing her history and answering questions.  More than willing to have NG tube placed if it might prior hide some relief.  EYES: Sclera non-icteric.   EARS, NOSE, MOUTH AND THROAT: Edentulous.  The oropharynx is clear. Oral mucosa is pink and moist.    Hearing is intact to voice.  NECK: Trachea is midline, and there is no jugular venous distension.  LYMPH NODES:  Lymph nodes in the neck are not enlarged. RESPIRATORY:   Normal respiratory effort  without pathologic use of accessory muscles. CARDIOVASCULAR: Heart is regular in rate and rhythm.   Well perfused.  GI: The abdomen is notable for bilateral subcostal scarring, short epigastric scar and infraumbilical midline scars.  No obvious hernia defect.  There is no guarding, no rebound.  Surprisingly my palpation does not appear to exacerbate her complaint of pain, she remains soft, nontender, and nondistended. There were rare to hypoactive bowel sounds. MUSCULOSKELETAL: Moves all extremities,  warm without edema.  SKIN: Skin turgor is normal. No pathologic skin lesions appreciated.  NEUROLOGIC: No appreciable focal deficits. PSYCH:  Alert and oriented to person, place and time. Affect is appropriate for situation.  Data Reviewed I have personally reviewed what is currently available of the patient's imaging, recent labs and medical records.    Labs:     Latest Ref Rng & Units 11/26/2023    5:39 PM 11/11/2023    8:12 PM 10/12/2023   10:55 AM  CBC  WBC 4.0 - 10.5 K/uL 11.7  8.4  8.1   Hemoglobin 12.0 - 15.0 g/dL 9.2  9.5  89.2   Hematocrit 36.0 - 46.0 % 29.0  30.0  33.1   Platelets 150 - 400 K/uL 567  385  430       Latest Ref Rng & Units 11/26/2023    5:39 PM 11/17/2023    1:24 PM 11/11/2023    8:12 PM  CMP  Glucose 70 - 99 mg/dL 854  753  854   BUN 8 - 23 mg/dL 11  12  12    Creatinine 0.44 - 1.00 mg/dL 9.37  9.49  9.53   Sodium 135 - 145 mmol/L 134  138  135   Potassium 3.5 - 5.1 mmol/L 3.1  3.4  3.6   Chloride 98 - 111 mmol/L 94  98  98   CO2 22 - 32 mmol/L 26  27  25    Calcium 8.9 - 10.3 mg/dL 8.5  8.8  8.8   Total Protein 6.5 - 8.1 g/dL 6.9   6.4   Total Bilirubin 0.0 - 1.2 mg/dL 0.6   0.4   Alkaline Phos 38 - 126 U/L 74   77   AST 15 - 41 U/L 23   14   ALT 0 - 44 U/L 17   10      Imaging studies:   Last 24 hrs: CT ABDOMEN PELVIS W CONTRAST Result Date: 11/26/2023 CLINICAL DATA:  Abdominal pain, vomiting EXAM: CT ABDOMEN AND PELVIS WITH CONTRAST TECHNIQUE:  Multidetector CT imaging of the abdomen and pelvis was performed using the standard protocol following bolus administration of intravenous contrast. RADIATION DOSE REDUCTION: This exam was performed according to the departmental dose-optimization program which includes automated exposure control, adjustment of the mA and/or kV according to patient size and/or use of iterative reconstruction technique. CONTRAST:  OMNIPAQUE  IOHEXOL  300 MG/ML  SOLN COMPARISON:  04/24/2023  FINDINGS: Lower chest: Large hiatal hernia, stable.  No acute findings. Hepatobiliary: No focal liver abnormality is seen. Status post cholecystectomy. No biliary dilatation. Spleen is enlarged measuring 20 cm in craniocaudal length. This could reflect hepatomegaly or a Riedel's lobe. Pancreas: Fatty replacement. No focal abnormality or ductal dilatation. Spleen: No focal abnormality.  Normal size. Adrenals/Urinary Tract: 3 mm nonobstructing stone in the upper pole of the right kidney. No ureteral stones or hydronephrosis. No suspicious renal or adrenal abnormality. Urinary bladder unremarkable. Stomach/Bowel: Sigmoid diverticulosis. No active diverticulitis. Abnormal appearing segments of distal small bowel in the pelvis with mild wall thickening and surrounding inflammation. There appears to be an area of narrowing and stricture best seen on images 40 6-47 of coronal series 5. This is immediately adjacent to the abnormally thickened appearing small bowel loop. Differential considerations would include adhesions in this area or inflammatory stricture. It is difficult to completely exclude internal hernia and ischemia. Small bowel loops proximal to this area are dilated with air-fluid levels compatible with a degree of small-bowel obstruction. Vascular/Lymphatic: No evidence of aneurysm or adenopathy. Reproductive: Prior hysterectomy.  No adnexal masses. Other: No free fluid or free air. Musculoskeletal: No acute bony abnormality. IMPRESSION:  Abnormal appearing distal small bowel within the pelvis with wall thickening and surrounding haziness/inflammation. Differential considerations would include enteritis (infectious, inflammatory or ischemic). There appears to be a stricture/area of narrowing with resulting small bowel obstruction with proximal loops fluid-filled with scattered air-fluid levels. While this could be related to enteritis and stricture or adhesions, cannot exclude internal hernia. Recommend surgical consultation. Liver measures enlarged at 20 cm in could reflect hepatomegaly or Riedel's lobe. Large hiatal hernia. These results were called by telephone at the time of interpretation on 11/26/2023 at 7:59 pm to provider KEVIN PADUCHOWSKI , who verbally acknowledged these results. Electronically Signed   By: Franky Crease M.D.   On: 11/26/2023 19:59  I have personally reviewed these images and agree with the considered differential diagnoses.    Assessment/Plan:  68 y.o. female with acute onset of lower abdominal pain with pain out of proportion to exam, concerning for possible internal hernia or ischemia, Internal hernia is a significant concern and if there is ischemia that has developed from this, would likely be consistent with her pain out of proportion to her exam, and an abnormal venous lactate.  Will have this drawn stat.  Patient Active Problem List   Diagnosis Date Noted  . Hyponatremia 11/26/2023  . Hypokalemia 10/29/2023  . Age-related osteoporosis without current pathological fracture 09/20/2023  . Vertebral osteomyelitis (HCC) 04/26/2023  . Lumbar discitis 04/26/2023  . Infection of cervical spine (HCC) 04/26/2023  . Surgical site infection 04/19/2023  . Falls 04/16/2023  . Chronic pain syndrome   . S/P cervical spinal fusion 04/12/2023  . Cervical myelopathy (HCC) 03/02/2023  . T2DM (type 2 diabetes mellitus) (HCC) 02/28/2023  . Stenosis of cervical spine with myelopathy (HCC) 02/04/2023  . Cervical  radicular pain (left severe) 12/10/2022  . Foraminal stenosis of cervical region 11/25/2022  . Cervical disc disorder with radiculopathy of cervical region 11/25/2022  . Cervicogenic headache 11/25/2022  . Chronic asthmatic bronchitis (HCC) 06/12/2022  . Anxiety and depression 06/02/2022  . Essential hypertension 06/02/2022  . Asthma 06/01/2022  . DOE (dyspnea on exertion) 05/13/2022  . Hypocalcemia 07/31/2021  . Esophageal dysphagia   . Benign esophageal stricture   . S/P hardware removal 11/14/2020  . Iron  deficiency anemia 10/18/2020  . S/P revision of total knee, right  11/14/2019  . Seasonal allergic rhinitis due to pollen 09/20/2019  . Callus of foot 08/13/2019  . Restless leg 04/30/2019  . Diastolic dysfunction 12/14/2018  . Acid reflux 12/14/2018  . Recurrent sinusitis 08/15/2018  . Migraine 04/22/2018  . Nausea 04/22/2018  . Polyneuropathy associated with underlying disease (HCC) 04/22/2018  . Midline low back pain without sciatica 04/22/2018  . Allergic rhinitis 07/01/2017  . Moderate asthma without complication 01/20/2016  . COPD (chronic obstructive pulmonary disease) (HCC) 09/10/2014  . Calculi, ureter 04/26/2013  . Corneal graft malfunction 08/17/2012  . Calculus of kidney 04/26/2012  . Urge incontinence 04/26/2012  . Diaphragmatic hernia 10/27/2010  . Barrett esophagus 07/07/2010    - Will obtain a stat venous lactate, and attempt careful placement of NG tube.  - Serial exams, I would be quick to recommend exploratory laparotomy for the slightest deterioration or evidence of ischemia.  - Will hold Eliquis  for now  - Dilaudid  and Zofran  as needed.  All of the above findings and recommendations were discussed with the patient, and all of patient's questions were answered to her expressed satisfaction.  I personally spent a total of 90 minutes in the care of the patient today including preparing to see the patient, getting/reviewing separately obtained history,  performing a medically appropriate exam/evaluation, placing orders, documenting clinical information in the EHR, independently interpreting results, and communicating results.    Thank you for the opportunity to participate in this patient's care.   -- Honor Leghorn, M.D., FACS 11/26/2023, 9:16 PM

## 2023-11-26 NOTE — H&P (Signed)
 History and Physical    Patient: Summer Murphy FMW:[id number] DOB: 1955/10/27 DOA: 11/26/2023 DOS: the patient was seen and examined on 11/26/2023 PCP: Liana Fish, NP  Patient coming from: Home  Chief Complaint:  Chief Complaint  Patient presents with   Abdominal Pain   HPI: Summer Murphy is a 68 y.o. female with medical history significant of GERD: Chronic pain syndrome, COPD, essential hypertension, multiple abdominal surgeries, anxiety disorder, asthma, type 2 diabetes, cervical spine myelopathy, migraine headaches, history of left bundle branch block who presents to the ER with abdominal pain.  Pain was sudden started today after eating lunch.  She has had 2 episodes of vomiting but and 1 episode of diarrhea.  Patient is having abdominal distention with 10 out of 10 pain.  Pain is in the periumbilical area going to the lower abdomen.  Patient has had previous history of blood clots in the abdomen.  Patient is currently on Eliquis .  No hematemesis no melena no bright red blood per rectum.  Workup performed in the ER showed findings consistent with possible partial small bowel obstruction, acute enteritis or ischemic bowel.  Patient therefore is being admitted for further evaluation and treatment.  Surgery consulted Dr. Lane to follow.  Review of Systems: As mentioned in the history of present illness. All other systems reviewed and are negative. Past Medical History:  Diagnosis Date   Acid reflux    Anemia    Anxiety    a.) buspirone  BID + BZO PRN (clonazepam )   Arthritis    Aspiration pneumonia (HCC) 06/02/2022   Asthma    CAP (community acquired pneumonia) 04/24/2022   Cervical disc disorder with radiculopathy of cervical region    Chronic pain syndrome    a.) on COT as prescribed by pain mangement   Chronic, continuous use of opioids    a.) followed by pain mangement   COPD (chronic obstructive pulmonary disease) (HCC)    DOE (dyspnea on exertion)    Hematuria     Hiatal hernia    History of kidney stones    Hypertension    Hypertensive urgency 05/12/2022   Hypoglycemia    Insomnia    a.) uses Trazodone  PRN   LBBB (left bundle branch block)    Migraine    MSSA bacteremia 04/19/2023   Murmur    Osteopenia    Palpitations    Pre-diabetes    Restless leg    a.) on ropinirole    Sepsis (HCC) 05/12/2022   Stenosis of cervical spine with myelopathy (HCC)    T2DM (type 2 diabetes mellitus) (HCC)    Past Surgical History:  Procedure Laterality Date   ABDOMINAL HYSTERECTOMY     AMPUTATION TOE Left 07/31/2016   Procedure: AMPUTATION TOE/MPJ 2nd toe;  Surgeon: Neill Boas, DPM;  Location: [facility];  Service: Podiatry;  Laterality: Left;   APPENDECTOMY  1990   BACK SURGERY     low back   BREAST SURGERY     bilateral breast reduction   CARDIAC ELECTROPHYSIOLOGY STUDY AND ABLATION     CERVICAL WOUND DEBRIDEMENT N/A 04/17/2023   Procedure: CERVICAL WOUND DEBRIDEMENT;  Surgeon: Bluford Standing, MD;  Location: [facility];  Service: Neurosurgery;  Laterality: N/A;   CHOLECYSTECTOMY  1990   COLONOSCOPY WITH PROPOFOL  N/A 01/04/2017   Procedure: COLONOSCOPY WITH PROPOFOL ;  Surgeon: Viktoria Lamar DASEN, MD;  Location: [facility] ENDOSCOPY;  Service: Endoscopy;  Laterality: N/A;   CORNEAL TRANSPLANT     ESOPHAGOGASTRODUODENOSCOPY N/A 04/09/2021   Procedure: ESOPHAGOGASTRODUODENOSCOPY (  EGD);  Surgeon: Unk Corinn Skiff, MD;  Location: [facility] ENDOSCOPY;  Service: Gastroenterology;  Laterality: N/A;   ESOPHAGOGASTRODUODENOSCOPY (EGD) WITH PROPOFOL  N/A 01/04/2017   Procedure: ESOPHAGOGASTRODUODENOSCOPY (EGD) WITH PROPOFOL ;  Surgeon: Viktoria Lamar DASEN, MD;  Location: [facility] ENDOSCOPY;  Service: Endoscopy;  Laterality: N/A;   EXCISION BONE CYST Left 07/31/2016   Procedure: EXCISION BONE CYST/exostectomy 28124/left 2nd;  Surgeon: Neill Boas, DPM;  Location: [facility];  Service: Podiatry;  Laterality: Left;   EXTRACORPOREAL SHOCK WAVE LITHOTRIPSY Left 09/12/2015   Procedure:  EXTRACORPOREAL SHOCK WAVE LITHOTRIPSY (ESWL);  Surgeon: Rosina Riis, MD;  Location: [facility];  Service: Urology;  Laterality: Left;   FRACTURE SURGERY     left foot   GUM SURGERY Left    gum infection   HARDWARE REMOVAL Left 11/14/2020   Procedure: HARDWARE REMOVAL;  Surgeon: Kathlynn Sharper, MD;  Location: [facility];  Service: Orthopedics;  Laterality: Left;   HH repair     Fundoplication   JOINT REPLACEMENT Bilateral 2013,2014   total knees   LAPAROSCOPIC HYSTERECTOMY     LITHOTRIPSY     periprosthetic supracondylar fracture of left femur  02/16/2020   [facility]   POSTERIOR CERVICAL FUSION/FORAMINOTOMY N/A 03/02/2023   Procedure: C3-6 POSTERIOR CERVICAL LAMINECTOMY AND FUSION;  Surgeon: Claudene Riis ORN, MD;  Location: [facility];  Service: Neurosurgery;  Laterality: N/A;   TONSILLECTOMY     TOTAL KNEE REVISION Right 11/14/2019   Procedure: Revision patella and tibial polyethylene;  Surgeon: Kathlynn Sharper, MD;  Location: [facility];  Service: Orthopedics;  Laterality: Right;   URETEROSCOPY     Social History:  reports that she has never smoked. She has been exposed to tobacco smoke. She has never used smokeless tobacco. She reports that she does not drink alcohol and does not use drugs.  Allergies  Allergen Reactions   Librium [Chlordiazepoxide] Shortness Of Breath   Reglan [Metoclopramide] Hives and Other (See Comments)    hallucinations    Vanilla Shortness Of Breath    Pt reports allergy to vanilla extract only   Aspirin Hives   Nsaids Rash    Rash/flares asthma issues.   Azithromycin  Other (See Comments)    Unsure of what the reaction was.   Buprenorphine Hcl Other (See Comments)    Unsure of what the reaction was.    Flexeril  [Cyclobenzaprine ]     hallucinations   Morphine Other (See Comments)    Unsure of reaction   Sulfa Antibiotics     Other reaction(s): Unknown   Tolmetin     Other Reaction: Allergy   Amlodipine  Besylate Itching and Rash    arms, stomach  and forehead   Iron  Nausea And Vomiting    Family History  Problem Relation Age of Onset   Hypertension Mother    Stroke Mother    Prostate cancer Father    Kidney Stones Father    Diabetes Brother    Hypertension Brother    Breast cancer Maternal Aunt    Kidney disease Neg Hx     Prior to Admission medications   Medication Sig Start Date End Date Taking? Authorizing Provider  Accu-Chek Softclix Lancets lancets Use 1 lancet to check glucose once daily and as needed for prediabetes R73.03 10/29/23   Liana Fish, NP  acetaminophen  (TYLENOL ) 500 MG tablet Take 2 tablets (1,000 mg total) by mouth every 6 (six) hours as needed. 03/22/23 03/21/24  Ulis Bottcher, PA-C  alendronate  (FOSAMAX ) 70 MG tablet TAKE 1 TABLET BY MOUTH WEEKLY  TAKE  WITH A FULL GLASS OF WATER   ON AN EMPTY STOMACH 08/18/23   Liana Fish, NP  Blood Glucose Monitoring Suppl (ACCU-CHEK GUIDE ME) w/Device KIT Use device to check glucose daily and as need for prediabetes R73.03 10/29/23   Liana Fish, NP  busPIRone  (BUSPAR ) 10 MG tablet TAKE 1 TABLET(10 MG) BY MOUTH TWICE DAILY 09/08/23   Liana Fish, NP  cefadroxil  (DURICEF) 500 MG capsule Take 2 capsules (1,000 mg total) by mouth 2 (two) times daily. 10/13/23   Fayette Bodily, MD  clonazePAM  (KLONOPIN ) 1 MG tablet Take 0.5-1 tablets (0.5-1 mg total) by mouth 2 (two) times daily as needed for anxiety. 06/15/23   Liana Fish, NP  COMBIVENT  RESPIMAT 20-100 MCG/ACT AERS respimat 1 puff every 6 (six) hours. 05/24/23   [provider]  diclofenac  Sodium (VOLTAREN ) 1 % GEL Apply 4 g topically 4 (four) times daily. 08/13/20   Khan, Fozia M, MD  DULoxetine  (CYMBALTA ) 60 MG capsule Take 1 capsule (60 mg total) by mouth at bedtime. 05/24/23   Liana Fish, NP  Dupilumab  (DUPIXENT ) 300 MG/2ML SOAJ Inject 300 mg into the skin every 14 (fourteen) days. **loading dose completed in office on 07/02/23** 07/02/23   Dgayli, Belva, MD  ELIQUIS  5 MG TABS  tablet TAKE 1 TABLET(5 MG) BY MOUTH TWICE DAILY 10/15/23   Abernathy, Alyssa, NP  fluticasone -salmeterol (WIXELA INHUB) 250-50 MCG/ACT AEPB Inhale 1 puff into the lungs in the morning and at bedtime. 12/21/22   Isadora Belva, MD  gabapentin  (NEURONTIN ) 300 MG capsule Take 1 capsule (300 mg total) by mouth 2 (two) times daily. 09/06/23   Abernathy, Alyssa, NP  glucose blood (ACCU-CHEK GUIDE TEST) test strip Use 1 test strip to check glucose once daily and as needed for prediabetes R73.03 10/29/23   Abernathy, Alyssa, NP  HYDROcodone -acetaminophen  (NORCO/VICODIN) 5-325 MG tablet Take 1 tablet by mouth 2 (two) times daily as needed for severe pain (pain score 7-10). 09/06/23   Liana Fish, NP  ipratropium-albuterol  (DUONEB) 0.5-2.5 (3) MG/3ML SOLN Take 3 mLs by nebulization every 6 (six) hours as needed. 04/28/23   Lenon Marien CROME, MD  lidocaine  (LIDODERM ) 5 % Place 1 patch onto the skin daily. To affected area of the neck. Remove & Discard patch within 12 hours or as directed by MD 09/06/23   Liana Fish, NP  methocarbamol  (ROBAXIN ) 750 MG tablet Take 1 tablet (750 mg total) by mouth every 8 (eight) hours as needed for muscle spasms. 05/24/23   Liana Fish, NP  methylPREDNISolone  (MEDROL  DOSEPAK) 4 MG TBPK tablet Take as Directed, per package instructions 09/03/23   Claudene Penne ORN, MD  montelukast  (SINGULAIR ) 10 MG tablet TAKE 1 TABLET(10 MG) BY MOUTH AT BEDTIME 08/11/23   Abernathy, Fish, NP  ondansetron  (ZOFRAN ) 4 MG tablet TAKE 1 TABLET BY MOUTH TWICE DAILY AS NEEDED 09/06/23   Liana Fish, NP  oxybutynin  (DITROPAN -XL) 10 MG 24 hr tablet TAKE 1 TABLET(10 MG) BY MOUTH AT BEDTIME 09/14/23   Abernathy, Alyssa, NP  pantoprazole  (PROTONIX ) 40 MG tablet Take 1 tablet (40 mg total) by mouth 2 (two) times daily. 05/24/23   Abernathy, Alyssa, NP  polyethylene glycol (MIRALAX  / GLYCOLAX ) 17 g packet Take 17 g by mouth 2 (two) times daily. 04/28/23   Lenon Marien CROME, MD  potassium chloride   (KLOR-CON  10) 10 MEQ tablet Take 1 tablet (10 mEq total) by mouth daily. 10/29/23   Liana Fish, NP  rOPINIRole  (REQUIP ) 1 MG tablet TAKE 1 TABLET(1 MG) BY MOUTH AT BEDTIME 11/17/23  Liana Fish, NP  SUMAtriptan  (IMITREX ) 100 MG tablet Take 1 tablet (100 mg total) by mouth every 2 (two) hours as needed (for migraine headaches.). May repeat in 1 hours if headache persists or recurs. 07/08/23   Liana Fish, NP  traZODone  (DESYREL ) 150 MG tablet TAKE 1 TABLET(150 MG) BY MOUTH AT BEDTIME 07/08/23   Liana Fish, NP    Physical Exam: Vitals:   11/26/23 1722 11/26/23 1724 11/26/23 1830  BP: (!) 123/111  131/70  Pulse: 100  99  Resp: (!) 28  13  Temp: 99.1 F (37.3 C)    SpO2: 100%  100%  Height:  4' 11 (1.499 m)    Constitutional: Acutely ill looking, NAD, calm, comfortable Eyes: PERRL, lids and conjunctivae normal ENMT: Mucous membranes are dry. Posterior pharynx clear of any exudate or lesions.Normal dentition.  Neck: normal, supple, no masses, no thyromegaly Respiratory: clear to auscultation bilaterally, no wheezing, no crackles. Normal respiratory effort. No accessory muscle use.  Cardiovascular: Sinus tachycardia, no murmurs / rubs / gallops. No extremity edema. 2+ pedal pulses. No carotid bruits.  Abdomen: Distended, diffusely tender but more in the periumbilical region, palpable mass in the periumbilical area, no hepatosplenomegaly. Bowel sounds positive.  Musculoskeletal: Good range of motion, no joint swelling or tenderness, Skin: no rashes, lesions, ulcers. No induration Neurologic: CN 2-12 grossly intact. Sensation intact, DTR normal. Strength 5/5 in all 4.  Psychiatric: Normal judgment and insight. Alert and oriented x 3. Normal mood  Data Reviewed:  Blood pressure 123/111, temperature 98.1.  Sodium 134, potassium 3.1, chloride 94, glucose 145, calcium 8.5.  Albumin 2.7, white count 11.7, hemoglobin 9.2 and platelets 567 CT abdomen pelvis shows abnormal  appearing distal small bowel within the pelvis was wall thickening and surrounding haziness or inflammation.  The differential consideration should include infectious or inflammatory enteritis or ischemic enteritis.  There appears to be a stricture or area of narrowing with resultant small bowel obstruction with proximal loops fluid-filled with scattered air-fluid levels.  Possibly internal hernia.  Patient also has large hiatal hernia  Assessment and Plan:  #1 partial small bowel obstruction: Patient will be admitted for management.  Surgery consulted.  NG tube may be inserted.  Pain control, nausea management.  Will follow surgical recommendation.  #2 type 2 diabetes: Sliding scale insulin .  Continue to monitor.  #3 essential hypertension: Patient will be NPO.  Will have to use IV for blood pressure control while NPO.  #4 GERD: Continues PPIs.  I will have IV Protonix .  #5 COPD: No acute exacerbation.  #6 hypokalemia: Will replete potassium  #7 hyponatremia: Will hydrate with saline  #8 anxiety disorder: Will continue his home regimen.  #9 polyneuropathy: Stable at baseline.    Advance Care Planning:   Code Status: Full Code   Consults: General Surgery Dr. Lane  Family Communication: No family at bedside  Severity of Illness: The appropriate patient status for this patient is INPATIENT. Inpatient status is judged to be reasonable and necessary in order to provide the required intensity of service to ensure the patient's safety. The patient's presenting symptoms, physical exam findings, and initial radiographic and laboratory data in the context of their chronic comorbidities is felt to place them at high risk for further clinical deterioration. Furthermore, it is not anticipated that the patient will be medically stable for discharge from the hospital within 2 midnights of admission.   * I certify that at the point of admission it is my clinical judgment that the patient  will  require inpatient hospital care spanning beyond 2 midnights from the point of admission due to high intensity of service, high risk for further deterioration and high frequency of surveillance required.*  AuthorBETHA SIM KNOLL, MD 11/26/2023 8:48 PM  For on call review [email or web address].

## 2023-11-26 NOTE — ED Notes (Signed)
 Patient transported to CT

## 2023-11-26 NOTE — ED Triage Notes (Signed)
 Pt comes in via [facility] with complaints of abdominal pain that started earlier today. According to EMS, the pt ate some food at about 2:30 this afternoon, and around 4 pm pt started severe lower abdominal pain. Pt with 2 episodes of vomiting, and one episode of diarrhea this afternoon. Pt complains of pain 10/10 at this time. Pt has a history of a blood clot in her stomach last year, and is on eliquis . Pt is alert and oriented x4.  Pt has no complaints of cp or sob.

## 2023-11-26 NOTE — ED Provider Notes (Signed)
 [facility] Provider Note    Event Date/Time   First MD Initiated Contact with Patient 11/26/23 1717     (approximate)  History   Chief Complaint: Abdominal Pain  HPI  Summer Murphy is a 68 y.o. female with a past medical history of gastric reflux, arthritis, chronic pain, COPD, hypertension, multiple abdominal surgeries, presents to the emergency department for abdominal pain.  According to the patient she is coming from home via EMS with abdominal pain that started around 4 PM today after eating lunch around 2 PM.  Patient states she has had 2 episodes of vomiting and 1 episode of diarrhea.  Complaining of a 10 out of 10 lower abdominal pain mostly in the periumbilical to lower abdomen.  States she has a history of a blood clot in her abdomen but does not know where exactly this is.  States she is on Eliquis  for the same.  Patient denies missing any doses.  No chest pain.  No fever.  No urinary symptoms.  Describes her pain as a 10/10 currently during my evaluation, sharp, periumbilical to lower abdomen.  Physical Exam   Triage Vital Signs: ED Triage Vitals  Encounter Vitals Group     BP 11/26/23 1722 (!) 123/111     Girls Systolic BP Percentile --      Girls Diastolic BP Percentile --      Boys Systolic BP Percentile --      Boys Diastolic BP Percentile --      Pulse Rate 11/26/23 1722 100     Resp 11/26/23 1722 (!) 28     Temp 11/26/23 1722 99.1 F (37.3 C)     Temp src --      SpO2 11/26/23 1722 100 %     Weight --      Height 11/26/23 1724 4' 11 (1.499 m)     Head Circumference --      Peak Flow --      Pain Score 11/26/23 1723 10     Pain Loc --      Pain Education --      Exclude from Growth Chart --     Most recent vital signs: Vitals:   11/26/23 1722  BP: (!) 123/111  Pulse: 100  Resp: (!) 28  Temp: 99.1 F (37.3 C)  SpO2: 100%    General: Awake, no distress.  CV:  Good peripheral perfusion.  Regular rate and rhythm   Resp:  Normal effort.  Equal breath sounds bilaterally.  Abd:  No distention.  Soft, mild to moderate tenderness to palpation in the periumbilical area to suprapubic area.  Multiple prior abdominal incision scars.   ED Results / Procedures / Treatments   RADIOLOGY  I have reviewed interpreted CT images.  Patient appears to have considerable amount of dilated small bowel. Radiology has read the CT scan as enteritis possible bowel obstruction possible internal hernia.   MEDICATIONS ORDERED IN ED: Medications  sodium chloride  0.9 % bolus 1,000 mL (has no administration in time range)  ondansetron  (ZOFRAN ) injection 4 mg (has no administration in time range)  HYDROmorphone  (DILAUDID ) injection 1 mg (1 mg Intravenous Given 11/26/23 1743)     IMPRESSION / MDM / ASSESSMENT AND PLAN / ED COURSE  I reviewed the triage vital signs and the nursing notes.  Patient's presentation is most consistent with acute presentation with potential threat to life or bodily function.  Patient presents emergency department for lower abdominal pain and diarrhea  and vomiting that started this afternoon around 4:00.  Patient has a history of chronic pain and takes oxycodone  at home.  Patient has multiple abdominal incisional scars, in reviewing her history it appears patient has had a hiatal hernia repair she has had a laparoscopic hysterectomy, she has had a cholecystectomy, appendectomy.  None of the incisions appear new, however adhesions would be a major concern which could be causing intestinal obstruction partial obstruction or pain.  She also has a history of a blood clot in her abdomen.  I reviewed the patient's discharge summary on 04/28/2023 which the patient was admitted for sepsis/bacteremia ultimately found to have nonocclusive thrombus in the IVC, and placed on Eliquis .  Given the patient's worsening abdominal pain we will treat the patient's pain nausea IV hydrate.  Will obtain CT imaging of the abdomen  and pelvis preferably with contrast for the patient's kidney function can tolerate.  Labs pending.  Patient agreeable to plan.  CT concerning for enteritis versus SBO versus partial SBO versus internal hernia.  I spoke to Dr. Lane of general surgery who has evaluated the CT images would like a NG tube placed which I have ordered.  I spoke to the hospitalist will be admitting to their service for further workup and treatment.  Updated the patient on the findings she is agreeable to this plan as well.  Patient states her pain is decreased from severe to mild after the second dose of pain medication.  Patient's lab work has resulted showing overall cysts nonconcerning chemistry mild dehydration.  Normal anion gap, normal LFTs and a reassuring CBC with a negative troponin.  FINAL CLINICAL IMPRESSION(S) / ED DIAGNOSES   Abdominal pain Nausea vomiting Small bowel obstruction  Note:  This document was prepared using Dragon voice recognition software and may include unintentional dictation errors.   Dorothyann Drivers, MD 11/26/23 2157

## 2023-11-26 NOTE — Consult Note (Addendum)
 [facility] SURGICAL CONSULTATION NOTE (initial) - cpt: 99255   HISTORY OF PRESENT ILLNESS (HPI):  68 y.o. female presented to [facility] ED today for evaluation of abdominal pain, abnormal CT scan. Patient reports acute onset of lower abdominal pain beginning at 4 PM this afternoon.  She reports she felt well, in her usual state earlier and throughout the day.  She had her last loose stool this morning which was not unusual for her.  She ate a sausage biscuit or part of 1, she vomited after the onset of the pain, thought it was mostly the food she had previously eaten.  Not described as bilious or significant volume.  She presented to the ED with a 10 out of 10 pain which is improved to 5 out of 10 at 1 point in time following Dilaudid , but is currently back to 10 out of 10.  She is writhing about clearly uncomfortable.  Unable to find a position that helps.  She denies any radiation of the pain, she denies any waxing or waning nature of it.  No known fevers or chills.  No recent flatus that is known.  She reports a history of 2 esophageal wraps/hiatal hernia repair, open cholecystectomy, hysterectomy and appendectomy.  Surgery is consulted by ED physician Dr. Dorothyann in this context for evaluation and management of abdominal pain with the associated CT findings as noted below.  I have reviewed her imaging, and concur nothing is definitive.    PAST MEDICAL HISTORY (PMH):  Past Medical History:  Diagnosis Date   Acid reflux    Anemia    Anxiety    a.) buspirone  BID + BZO PRN (clonazepam )   Arthritis    Aspiration pneumonia (HCC) 06/02/2022   Asthma    CAP (community acquired pneumonia) 04/24/2022   Cervical disc disorder with radiculopathy of cervical region    Chronic pain syndrome    a.) on COT as prescribed by pain mangement   Chronic, continuous use of opioids    a.) followed by pain mangement   COPD (chronic obstructive pulmonary disease) (HCC)    DOE (dyspnea on  exertion)    Hematuria    Hiatal hernia    History of kidney stones    Hypertension    Hypertensive urgency 05/12/2022   Hypoglycemia    Insomnia    a.) uses Trazodone  PRN   LBBB (left bundle branch block)    Migraine    MSSA bacteremia 04/19/2023   Murmur    Osteopenia    Palpitations    Pre-diabetes    Restless leg    a.) on ropinirole    Sepsis (HCC) 05/12/2022   Stenosis of cervical spine with myelopathy (HCC)    T2DM (type 2 diabetes mellitus) (HCC)      PAST SURGICAL HISTORY (PSH):  Past Surgical History:  Procedure Laterality Date   ABDOMINAL HYSTERECTOMY     AMPUTATION TOE Left 07/31/2016   Procedure: AMPUTATION TOE/MPJ 2nd toe;  Surgeon: Neill Boas, DPM;  Location: [facility] ORS;  Service: Podiatry;  Laterality: Left;   APPENDECTOMY  1990   BACK SURGERY     low back   BREAST SURGERY     bilateral breast reduction   CARDIAC ELECTROPHYSIOLOGY STUDY AND ABLATION     CERVICAL WOUND DEBRIDEMENT N/A 04/17/2023   Procedure: CERVICAL WOUND DEBRIDEMENT;  Surgeon: Bluford Standing, MD;  Location: [facility] ORS;  Service: Neurosurgery;  Laterality: N/A;   CHOLECYSTECTOMY  1990   COLONOSCOPY WITH PROPOFOL  N/A 01/04/2017   Procedure:  COLONOSCOPY WITH PROPOFOL ;  Surgeon: Viktoria Lamar DASEN, MD;  Location: [facility] ENDOSCOPY;  Service: Endoscopy;  Laterality: N/A;   CORNEAL TRANSPLANT     ESOPHAGOGASTRODUODENOSCOPY N/A 04/09/2021   Procedure: ESOPHAGOGASTRODUODENOSCOPY (EGD);  Surgeon: Unk Corinn Skiff, MD;  Location: [facility] ENDOSCOPY;  Service: Gastroenterology;  Laterality: N/A;   ESOPHAGOGASTRODUODENOSCOPY (EGD) WITH PROPOFOL  N/A 01/04/2017   Procedure: ESOPHAGOGASTRODUODENOSCOPY (EGD) WITH PROPOFOL ;  Surgeon: Viktoria Lamar DASEN, MD;  Location: [facility] ENDOSCOPY;  Service: Endoscopy;  Laterality: N/A;   EXCISION BONE CYST Left 07/31/2016   Procedure: EXCISION BONE CYST/exostectomy 28124/left 2nd;  Surgeon: Neill Boas, DPM;  Location: [facility] ORS;  Service: Podiatry;  Laterality: Left;    EXTRACORPOREAL SHOCK WAVE LITHOTRIPSY Left 09/12/2015   Procedure: EXTRACORPOREAL SHOCK WAVE LITHOTRIPSY (ESWL);  Surgeon: Rosina Riis, MD;  Location: [facility] ORS;  Service: Urology;  Laterality: Left;   FRACTURE SURGERY     left foot   GUM SURGERY Left    gum infection   HARDWARE REMOVAL Left 11/14/2020   Procedure: HARDWARE REMOVAL;  Surgeon: Kathlynn Sharper, MD;  Location: [facility] ORS;  Service: Orthopedics;  Laterality: Left;   HH repair     Fundoplication   JOINT REPLACEMENT Bilateral 2013,2014   total knees   LAPAROSCOPIC HYSTERECTOMY     LITHOTRIPSY     periprosthetic supracondylar fracture of left femur  02/16/2020   [facility]   POSTERIOR CERVICAL FUSION/FORAMINOTOMY N/A 03/02/2023   Procedure: C3-6 POSTERIOR CERVICAL LAMINECTOMY AND FUSION;  Surgeon: Claudene Riis ORN, MD;  Location: [facility] ORS;  Service: Neurosurgery;  Laterality: N/A;   TONSILLECTOMY     TOTAL KNEE REVISION Right 11/14/2019   Procedure: Revision patella and tibial polyethylene;  Surgeon: Kathlynn Sharper, MD;  Location: [facility] ORS;  Service: Orthopedics;  Laterality: Right;   URETEROSCOPY       MEDICATIONS:  Prior to Admission medications   Medication Sig Start Date End Date Taking? Authorizing Provider  Accu-Chek Softclix Lancets lancets Use 1 lancet to check glucose once daily and as needed for prediabetes R73.03 10/29/23   Liana Fish, NP  acetaminophen  (TYLENOL ) 500 MG tablet Take 2 tablets (1,000 mg total) by mouth every 6 (six) hours as needed. 03/22/23 03/21/24  Ulis Bottcher, PA-C  alendronate  (FOSAMAX ) 70 MG tablet TAKE 1 TABLET BY MOUTH WEEKLY  TAKE WITH A FULL GLASS OF WATER   ON AN EMPTY STOMACH 08/18/23   Liana Fish, NP  Blood Glucose Monitoring Suppl (ACCU-CHEK GUIDE ME) w/Device KIT Use device to check glucose daily and as need for prediabetes R73.03 10/29/23   Liana Fish, NP  busPIRone  (BUSPAR ) 10 MG tablet TAKE 1 TABLET(10 MG) BY MOUTH TWICE DAILY 09/08/23   Liana Fish, NP   cefadroxil  (DURICEF) 500 MG capsule Take 2 capsules (1,000 mg total) by mouth 2 (two) times daily. 10/13/23   Fayette Bodily, MD  clonazePAM  (KLONOPIN ) 1 MG tablet Take 0.5-1 tablets (0.5-1 mg total) by mouth 2 (two) times daily as needed for anxiety. 06/15/23   Liana Fish, NP  COMBIVENT  RESPIMAT 20-100 MCG/ACT AERS respimat 1 puff every 6 (six) hours. 05/24/23   [provider]  diclofenac  Sodium (VOLTAREN ) 1 % GEL Apply 4 g topically 4 (four) times daily. 08/13/20   Khan, Fozia M, MD  DULoxetine  (CYMBALTA ) 60 MG capsule Take 1 capsule (60 mg total) by mouth at bedtime. 05/24/23   Liana Fish, NP  Dupilumab  (DUPIXENT ) 300 MG/2ML SOAJ Inject 300 mg into the skin every 14 (fourteen) days. **loading dose completed in office on 07/02/23** 07/02/23   Dgayli,  Khabib, MD  ELIQUIS  5 MG TABS tablet TAKE 1 TABLET(5 MG) BY MOUTH TWICE DAILY 10/15/23   Abernathy, Alyssa, NP  fluticasone -salmeterol (WIXELA INHUB) 250-50 MCG/ACT AEPB Inhale 1 puff into the lungs in the morning and at bedtime. 12/21/22   Isadora Hose, MD  gabapentin  (NEURONTIN ) 300 MG capsule Take 1 capsule (300 mg total) by mouth 2 (two) times daily. 09/06/23   Abernathy, Alyssa, NP  glucose blood (ACCU-CHEK GUIDE TEST) test strip Use 1 test strip to check glucose once daily and as needed for prediabetes R73.03 10/29/23   Liana Fish, NP  HYDROcodone -acetaminophen  (NORCO/VICODIN) 5-325 MG tablet Take 1 tablet by mouth 2 (two) times daily as needed for severe pain (pain score 7-10). 09/06/23   Liana Fish, NP  ipratropium-albuterol  (DUONEB) 0.5-2.5 (3) MG/3ML SOLN Take 3 mLs by nebulization every 6 (six) hours as needed. 04/28/23   Lenon Marien CROME, MD  lidocaine  (LIDODERM ) 5 % Place 1 patch onto the skin daily. To affected area of the neck. Remove & Discard patch within 12 hours or as directed by MD 09/06/23   Liana Fish, NP  methocarbamol  (ROBAXIN ) 750 MG tablet Take 1 tablet (750 mg total) by mouth every 8  (eight) hours as needed for muscle spasms. 05/24/23   Liana Fish, NP  methylPREDNISolone  (MEDROL  DOSEPAK) 4 MG TBPK tablet Take as Directed, per package instructions 09/03/23   Claudene Penne ORN, MD  montelukast  (SINGULAIR ) 10 MG tablet TAKE 1 TABLET(10 MG) BY MOUTH AT BEDTIME 08/11/23   Abernathy, Fish, NP  ondansetron  (ZOFRAN ) 4 MG tablet TAKE 1 TABLET BY MOUTH TWICE DAILY AS NEEDED 09/06/23   Liana Fish, NP  oxybutynin  (DITROPAN -XL) 10 MG 24 hr tablet TAKE 1 TABLET(10 MG) BY MOUTH AT BEDTIME 09/14/23   Abernathy, Alyssa, NP  pantoprazole  (PROTONIX ) 40 MG tablet Take 1 tablet (40 mg total) by mouth 2 (two) times daily. 05/24/23   Abernathy, Alyssa, NP  polyethylene glycol (MIRALAX  / GLYCOLAX ) 17 g packet Take 17 g by mouth 2 (two) times daily. 04/28/23   Lenon Marien CROME, MD  potassium chloride  (KLOR-CON  10) 10 MEQ tablet Take 1 tablet (10 mEq total) by mouth daily. 10/29/23   Liana Fish, NP  rOPINIRole  (REQUIP ) 1 MG tablet TAKE 1 TABLET(1 MG) BY MOUTH AT BEDTIME 11/17/23   Liana Fish, NP  SUMAtriptan  (IMITREX ) 100 MG tablet Take 1 tablet (100 mg total) by mouth every 2 (two) hours as needed (for migraine headaches.). May repeat in 1 hours if headache persists or recurs. 07/08/23   Abernathy, Alyssa, NP  traZODone  (DESYREL ) 150 MG tablet TAKE 1 TABLET(150 MG) BY MOUTH AT BEDTIME 07/08/23   Liana Fish, NP     ALLERGIES:  Allergies  Allergen Reactions   Librium [Chlordiazepoxide] Shortness Of Breath   Reglan [Metoclopramide] Hives and Other (See Comments)    hallucinations    Vanilla Shortness Of Breath    Pt reports allergy to vanilla extract only   Aspirin Hives   Nsaids Rash    Rash/flares asthma issues.   Azithromycin  Other (See Comments)    Unsure of what the reaction was.   Buprenorphine Hcl Other (See Comments)    Unsure of what the reaction was.    Flexeril  [Cyclobenzaprine ]     hallucinations   Morphine Other (See Comments)    Unsure of reaction    Sulfa Antibiotics     Other reaction(s): Unknown   Tolmetin     Other Reaction: Allergy   Amlodipine  Besylate Itching and Rash  arms, stomach and forehead   Iron  Nausea And Vomiting     SOCIAL HISTORY:  Social History   Socioeconomic History   Marital status: Widowed    Spouse name: Not on file   Number of children: Not on file   Years of education: Not on file   Highest education level: Not on file  Occupational History   Not on file  Tobacco Use   Smoking status: Never    Passive exposure: Past   Smokeless tobacco: Never  Vaping Use   Vaping status: Never Used  Substance and Sexual Activity   Alcohol use: No   Drug use: No   Sexual activity: Not Currently  Other Topics Concern   Not on file  Social History Narrative   Lives at home alone. Relatives are the support person.    Social Drivers of Corporate investment banker Strain: Low Risk  (11/26/2023)   Received from [facility]   Overall Financial Resource Strain (CARDIA)    Difficulty of Paying Living Expenses: Not hard at all  Food Insecurity: No Food Insecurity (11/26/2023)   Received from [facility]   Hunger Vital Sign    Within the past 12 months, you worried that your food would run out before you got the money to buy more.: Never true    Within the past 12 months, the food you bought just didn't last and you didn't have money to get more.: Never true  Transportation Needs: No Transportation Needs (11/26/2023)   Received from [facility] - Transportation    In the past 12 months, has lack of transportation kept you from medical appointments or from getting medications?: No    Lack of Transportation (Non-Medical): No  Physical Activity: Not on file  Stress: Not on file  Social Connections: Moderately Isolated (04/16/2023)   Social Connection and Isolation Panel    Frequency of Communication with Friends and Family: More than three times a week     Frequency of Social Gatherings with Friends and Family: Once a week    Attends Religious Services: More than 4 times per year    Active Member of [employer] or Organizations: No    Attends Banker Meetings: Never    Marital Status: Widowed  Intimate Partner Violence: Not At Risk (04/16/2023)   Humiliation, Afraid, Rape, and Kick questionnaire    Fear of Current or Ex-Partner: No    Emotionally Abused: No    Physically Abused: No    Sexually Abused: No     FAMILY HISTORY:  Family History  Problem Relation Age of Onset   Hypertension Mother    Stroke Mother    Prostate cancer Father    Kidney Stones Father    Diabetes Brother    Hypertension Brother    Breast cancer Maternal Aunt    Kidney disease Neg Hx       VITAL SIGNS:  Temp:  [99.1 F (37.3 C)] 99.1 F (37.3 C) (09/05 1722) Pulse Rate:  [99-100] 99 (09/05 1830) Resp:  [13-28] 13 (09/05 1830) BP: (123-131)/(70-111) 131/70 (09/05 1830) SpO2:  [100 %] 100 % (09/05 1830)     Height: 4' 11 (149.9 cm)       INTAKE/OUTPUT:  No intake/output data recorded.  PHYSICAL EXAM:  Physical Exam Blood pressure 131/70, pulse 99, temperature 99.1 F (37.3 C), resp. rate 13, height 4' 11 (1.499 m), SpO2 100%.   CONSTITUTIONAL: Edentulous frail chronic  ill-appearing female writhing about with dyskinetic activity, she reports secondary to her pain.  Distractible, quieting her writhing while discussing her history and answering questions.  More than willing to have NG tube placed if it might prior hide some relief.  EYES: Sclera non-icteric.   EARS, NOSE, MOUTH AND THROAT: Edentulous.  The oropharynx is clear. Oral mucosa is pink and moist.    Hearing is intact to voice.  NECK: Trachea is midline, and there is no jugular venous distension.  LYMPH NODES:  Lymph nodes in the neck are not enlarged. RESPIRATORY:   Normal respiratory effort without pathologic use of accessory muscles. CARDIOVASCULAR: Heart is regular in rate and  rhythm.   Well perfused.  GI: The abdomen is notable for bilateral subcostal scarring, short epigastric scar and infraumbilical midline scars.  No obvious hernia defect.  There is no guarding, no rebound.  Surprisingly my palpation does not appear to exacerbate her complaint of pain, she remains soft, nontender, and nondistended. There were rare to hypoactive bowel sounds. MUSCULOSKELETAL: Moves all extremities,  warm without edema.  SKIN: Skin turgor is normal. No pathologic skin lesions appreciated.  NEUROLOGIC: No appreciable focal deficits. PSYCH:  Alert and oriented to person, place and time. Affect is appropriate for situation.  Data Reviewed I have personally reviewed what is currently available of the patient's imaging, recent labs and medical records.    Labs:     Latest Ref Rng & Units 11/26/2023    5:39 PM 11/11/2023    8:12 PM 10/12/2023   10:55 AM  CBC  WBC 4.0 - 10.5 K/uL 11.7  8.4  8.1   Hemoglobin 12.0 - 15.0 g/dL 9.2  9.5  89.2   Hematocrit 36.0 - 46.0 % 29.0  30.0  33.1   Platelets 150 - 400 K/uL 567  385  430       Latest Ref Rng & Units 11/26/2023    5:39 PM 11/17/2023    1:24 PM 11/11/2023    8:12 PM  CMP  Glucose 70 - 99 mg/dL 854  753  854   BUN 8 - 23 mg/dL 11  12  12    Creatinine 0.44 - 1.00 mg/dL 9.37  9.49  9.53   Sodium 135 - 145 mmol/L 134  138  135   Potassium 3.5 - 5.1 mmol/L 3.1  3.4  3.6   Chloride 98 - 111 mmol/L 94  98  98   CO2 22 - 32 mmol/L 26  27  25    Calcium 8.9 - 10.3 mg/dL 8.5  8.8  8.8   Total Protein 6.5 - 8.1 g/dL 6.9   6.4   Total Bilirubin 0.0 - 1.2 mg/dL 0.6   0.4   Alkaline Phos 38 - 126 U/L 74   77   AST 15 - 41 U/L 23   14   ALT 0 - 44 U/L 17   10      Imaging studies:   Last 24 hrs: CT ABDOMEN PELVIS W CONTRAST Result Date: 11/26/2023 CLINICAL DATA:  Abdominal pain, vomiting EXAM: CT ABDOMEN AND PELVIS WITH CONTRAST TECHNIQUE: Multidetector CT imaging of the abdomen and pelvis was performed using the standard protocol  following bolus administration of intravenous contrast. RADIATION DOSE REDUCTION: This exam was performed according to the departmental dose-optimization program which includes automated exposure control, adjustment of the mA and/or kV according to patient size and/or use of iterative reconstruction technique. CONTRAST:  OMNIPAQUE  IOHEXOL  300 MG/ML  SOLN COMPARISON:  04/24/2023  FINDINGS: Lower chest: Large hiatal hernia, stable.  No acute findings. Hepatobiliary: No focal liver abnormality is seen. Status post cholecystectomy. No biliary dilatation. Spleen is enlarged measuring 20 cm in craniocaudal length. This could reflect hepatomegaly or a Riedel's lobe. Pancreas: Fatty replacement. No focal abnormality or ductal dilatation. Spleen: No focal abnormality.  Normal size. Adrenals/Urinary Tract: 3 mm nonobstructing stone in the upper pole of the right kidney. No ureteral stones or hydronephrosis. No suspicious renal or adrenal abnormality. Urinary bladder unremarkable. Stomach/Bowel: Sigmoid diverticulosis. No active diverticulitis. Abnormal appearing segments of distal small bowel in the pelvis with mild wall thickening and surrounding inflammation. There appears to be an area of narrowing and stricture best seen on images 40 6-47 of coronal series 5. This is immediately adjacent to the abnormally thickened appearing small bowel loop. Differential considerations would include adhesions in this area or inflammatory stricture. It is difficult to completely exclude internal hernia and ischemia. Small bowel loops proximal to this area are dilated with air-fluid levels compatible with a degree of small-bowel obstruction. Vascular/Lymphatic: No evidence of aneurysm or adenopathy. Reproductive: Prior hysterectomy.  No adnexal masses. Other: No free fluid or free air. Musculoskeletal: No acute bony abnormality. IMPRESSION: Abnormal appearing distal small bowel within the pelvis with wall thickening and surrounding  haziness/inflammation. Differential considerations would include enteritis (infectious, inflammatory or ischemic). There appears to be a stricture/area of narrowing with resulting small bowel obstruction with proximal loops fluid-filled with scattered air-fluid levels. While this could be related to enteritis and stricture or adhesions, cannot exclude internal hernia. Recommend surgical consultation. Liver measures enlarged at 20 cm in could reflect hepatomegaly or Riedel's lobe. Large hiatal hernia. These results were called by telephone at the time of interpretation on 11/26/2023 at 7:59 pm to provider KEVIN PADUCHOWSKI , who verbally acknowledged these results. Electronically Signed   By: Franky Crease M.D.   On: 11/26/2023 19:59  I have personally reviewed these images and agree with the considered differential diagnoses.    Assessment/Plan:  68 y.o. female with acute onset of lower abdominal pain with pain out of proportion to exam, concerning for possible internal hernia and early ischemia.  Patient Active Problem List   Diagnosis Date Noted   Hyponatremia 11/26/2023   Hypokalemia 10/29/2023   Age-related osteoporosis without current pathological fracture 09/20/2023   Vertebral osteomyelitis (HCC) 04/26/2023   Lumbar discitis 04/26/2023   Infection of cervical spine (HCC) 04/26/2023   Surgical site infection 04/19/2023   Falls 04/16/2023   Chronic pain syndrome    S/P cervical spinal fusion 04/12/2023   Cervical myelopathy (HCC) 03/02/2023   T2DM (type 2 diabetes mellitus) (HCC) 02/28/2023   Stenosis of cervical spine with myelopathy (HCC) 02/04/2023   Cervical radicular pain (left severe) 12/10/2022   Foraminal stenosis of cervical region 11/25/2022   Cervical disc disorder with radiculopathy of cervical region 11/25/2022   Cervicogenic headache 11/25/2022   Chronic asthmatic bronchitis (HCC) 06/12/2022   Anxiety and depression 06/02/2022   Essential hypertension 06/02/2022   Asthma  06/01/2022   DOE (dyspnea on exertion) 05/13/2022   Hypocalcemia 07/31/2021   Esophageal dysphagia    Benign esophageal stricture    S/P hardware removal 11/14/2020   Iron  deficiency anemia 10/18/2020   S/P revision of total knee, right 11/14/2019   Seasonal allergic rhinitis due to pollen 09/20/2019   Callus of foot 08/13/2019   Restless leg 04/30/2019   Diastolic dysfunction 12/14/2018   Acid reflux 12/14/2018   Recurrent sinusitis 08/15/2018   Migraine  04/22/2018   Nausea 04/22/2018   Polyneuropathy associated with underlying disease (HCC) 04/22/2018   Midline low back pain without sciatica 04/22/2018   Allergic rhinitis 07/01/2017   Moderate asthma without complication 01/20/2016   COPD (chronic obstructive pulmonary disease) (HCC) 09/10/2014   Calculi, ureter 04/26/2013   Corneal graft malfunction 08/17/2012   Calculus of kidney 04/26/2012   Urge incontinence 04/26/2012   Diaphragmatic hernia 10/27/2010   Barrett esophagus 07/07/2010    - Will obtain a stat venous lactate, and attempt careful placement of NG tube.  - We will proceed with exploratory laparotomy   All of the above findings and recommendations were discussed with the patient, and all of patient's questions were answered to her expressed satisfaction.  Risks discussed which include anesthesia, bleeding, infection, probable/possible small bowel resection, with potential risks of anastomotic failure, fistula formation, ventral hernia, possible prolonged postoperative ventilation, etc.  We discussed that these risks are not all inclusive, and even severe neurologic deficit or death may occur.  She has had her questions answered, she has excepted the risks and desires to proceed without any guarantees ever expressed or implied.  I personally spent a total of 90 minutes in the care of the patient today including preparing to see the patient, getting/reviewing separately obtained history, performing a medically  appropriate exam/evaluation, placing orders, documenting clinical information in the EHR, independently interpreting results, and communicating results.    Thank you for the opportunity to participate in this patient's care.   -- Honor Leghorn, M.D., FACS 11/26/2023, 9:16 PM

## 2023-11-27 ENCOUNTER — Inpatient Hospital Stay: Admitting: General Practice

## 2023-11-27 ENCOUNTER — Encounter
Admission: EM | Disposition: A | Discharge status: Home Health | Payer: Self-pay | Source: Home / Self Care | Attending: Internal Medicine

## 2023-11-27 ENCOUNTER — Other Ambulatory Visit: Payer: Self-pay

## 2023-11-27 DIAGNOSIS — K450 Other specified abdominal hernia with obstruction, without gangrene: Secondary | ICD-10-CM

## 2023-11-27 DIAGNOSIS — K56609 Unspecified intestinal obstruction, unspecified as to partial versus complete obstruction: Secondary | ICD-10-CM | POA: Diagnosis not present

## 2023-11-27 DIAGNOSIS — K458 Other specified abdominal hernia without obstruction or gangrene: Secondary | ICD-10-CM

## 2023-11-27 HISTORY — PX: LAPAROTOMY: SHX154

## 2023-11-27 LAB — COMPREHENSIVE METABOLIC PANEL WITH GFR
ALT: 16 U/L (ref 0–44)
AST: 15 U/L (ref 15–41)
Albumin: 2.5 g/dL — ABNORMAL LOW (ref 3.5–5.0)
Alkaline Phosphatase: 63 U/L (ref 38–126)
Anion gap: 13 (ref 5–15)
BUN: 8 mg/dL (ref 8–23)
CO2: 24 mmol/L (ref 22–32)
Calcium: 7.8 mg/dL — ABNORMAL LOW (ref 8.9–10.3)
Chloride: 98 mmol/L (ref 98–111)
Creatinine, Ser: 0.43 mg/dL — ABNORMAL LOW (ref 0.44–1.00)
GFR, Estimated: >60 mL/min (ref >60)
Glucose, Bld: 122 mg/dL — ABNORMAL HIGH (ref 70–99)
Potassium: 3.1 mmol/L — ABNORMAL LOW (ref 3.5–5.1)
Sodium: 135 mmol/L (ref 135–145)
Total Bilirubin: 0.5 mg/dL (ref 0.0–1.2)
Total Protein: 5.9 g/dL — ABNORMAL LOW (ref 6.5–8.1)

## 2023-11-27 LAB — MRSA NEXT GEN BY PCR, NASAL: MRSA by PCR Next Gen: NOT DETECTED

## 2023-11-27 LAB — URINALYSIS, COMPLETE (UACMP) WITH MICROSCOPIC
Bilirubin Urine: NEGATIVE
Glucose, UA: NEGATIVE mg/dL
Ketones, ur: NEGATIVE mg/dL
Leukocytes,Ua: NEGATIVE
Nitrite: NEGATIVE
Protein, ur: 30 mg/dL — AB
Specific Gravity, Urine: 1.042 — ABNORMAL HIGH (ref 1.005–1.030)
pH: 5 (ref 5.0–8.0)

## 2023-11-27 LAB — CBC
HCT: 25.5 % — ABNORMAL LOW (ref 36.0–46.0)
Hemoglobin: 8.1 g/dL — ABNORMAL LOW (ref 12.0–15.0)
MCH: 25.8 pg — ABNORMAL LOW (ref 26.0–34.0)
MCHC: 31.8 g/dL (ref 30.0–36.0)
MCV: 81.2 fL (ref 80.0–100.0)
Platelets: 465 K/uL — ABNORMAL HIGH (ref 150–400)
RBC: 3.14 MIL/uL — ABNORMAL LOW (ref 3.87–5.11)
RDW: 15.5 % (ref 11.5–15.5)
WBC: 9.7 K/uL (ref 4.0–10.5)
nRBC: 0 % (ref 0.0–0.2)

## 2023-11-27 LAB — MAGNESIUM
Magnesium: 1.5 mg/dL — ABNORMAL LOW (ref 1.7–2.4)
Magnesium: 2.2 mg/dL (ref 1.7–2.4)

## 2023-11-27 LAB — GLUCOSE, CAPILLARY
Glucose-Capillary: 160 mg/dL — ABNORMAL HIGH (ref 70–99)
Glucose-Capillary: 157 mg/dL — ABNORMAL HIGH (ref 70–99)
Glucose-Capillary: 119 mg/dL — ABNORMAL HIGH (ref 70–99)
Glucose-Capillary: 120 mg/dL — ABNORMAL HIGH (ref 70–99)
Glucose-Capillary: 124 mg/dL — ABNORMAL HIGH (ref 70–99)

## 2023-11-27 LAB — POTASSIUM: Potassium: 3.7 mmol/L (ref 3.5–5.1)

## 2023-11-27 LAB — HIV ANTIBODY (ROUTINE TESTING W REFLEX): HIV Screen 4th Generation wRfx: NONREACTIVE

## 2023-11-27 LAB — LACTIC ACID, PLASMA: Lactic Acid, Venous: 0.7 mmol/L (ref 0.5–1.9)

## 2023-11-27 SURGERY — LAPAROTOMY, EXPLORATORY
Anesthesia: General

## 2023-11-27 MED ORDER — TRAZODONE HCL 50 MG PO TABS
150.0000 mg | ORAL_TABLET | Freq: Every day | ORAL | Status: DC
  Administered 2023-11-28 – 2023-11-29 (×2): 150 mg via ORAL
  Filled 2023-11-27 (×2): qty 1

## 2023-11-27 MED ORDER — DULOXETINE HCL 30 MG PO CPEP
60.0000 mg | ORAL_CAPSULE | Freq: Every day | ORAL | Status: DC
  Administered 2023-11-28 – 2023-11-29 (×2): 60 mg via ORAL
  Filled 2023-11-27 (×2): qty 2

## 2023-11-27 MED ORDER — PHENYLEPHRINE HCL-NACL 20-0.9 MG/250ML-% IV SOLN
INTRAVENOUS | Status: AC
  Filled 2023-11-27: qty 250

## 2023-11-27 MED ORDER — HYDROCORTISONE 1 % EX CREA
TOPICAL_CREAM | Freq: Three times a day (TID) | CUTANEOUS | Status: DC | PRN
  Filled 2023-11-27: qty 28

## 2023-11-27 MED ORDER — MIDAZOLAM HCL 2 MG/2ML IJ SOLN
INTRAMUSCULAR | Status: AC
  Filled 2023-11-27: qty 2

## 2023-11-27 MED ORDER — PROPOFOL 10 MG/ML IV BOLUS
INTRAVENOUS | Status: AC
Start: 2023-11-27 — End: 2023-11-27
  Filled 2023-11-27: qty 20

## 2023-11-27 MED ORDER — LIDOCAINE HCL (PF) 2 % IJ SOLN
INTRAMUSCULAR | Status: AC
  Filled 2023-11-27: qty 5

## 2023-11-27 MED ORDER — DIPHENHYDRAMINE HCL 50 MG/ML IJ SOLN
12.5000 mg | Freq: Once | INTRAMUSCULAR | Status: AC
  Administered 2023-11-27: 12.5 mg via INTRAVENOUS
  Filled 2023-11-27: qty 1

## 2023-11-27 MED ORDER — FENTANYL CITRATE (PF) 100 MCG/2ML IJ SOLN
INTRAMUSCULAR | Status: AC
Start: 2023-11-27 — End: 2023-11-27
  Filled 2023-11-27: qty 2

## 2023-11-27 MED ORDER — ROPINIROLE HCL 1 MG PO TABS
1.0000 mg | ORAL_TABLET | Freq: Every day | ORAL | Status: DC
  Administered 2023-11-28 – 2023-11-30 (×3): 1 mg via ORAL
  Filled 2023-11-27 (×4): qty 1

## 2023-11-27 MED ORDER — IPRATROPIUM-ALBUTEROL 0.5-2.5 (3) MG/3ML IN SOLN: 3.0000 mL | Freq: Four times a day (QID) | RESPIRATORY_TRACT | Status: DC | PRN

## 2023-11-27 MED ORDER — PHENYLEPHRINE 80 MCG/ML (10ML) SYRINGE FOR IV PUSH (FOR BLOOD PRESSURE SUPPORT)
PREFILLED_SYRINGE | INTRAVENOUS | Status: AC
Start: 2023-11-27 — End: 2023-11-27
  Filled 2023-11-27: qty 10

## 2023-11-27 MED ORDER — 0.9 % SODIUM CHLORIDE (POUR BTL) OPTIME
TOPICAL | Status: DC | PRN
Start: 2023-11-27 — End: 2023-11-27
  Administered 2023-11-27: 1000 mL

## 2023-11-27 MED ORDER — MONTELUKAST SODIUM 10 MG PO TABS
10.0000 mg | ORAL_TABLET | Freq: Every day | ORAL | Status: DC
  Administered 2023-11-28 – 2023-11-29 (×2): 10 mg via ORAL
  Filled 2023-11-27 (×2): qty 1

## 2023-11-27 MED ORDER — SUCCINYLCHOLINE CHLORIDE 200 MG/10ML IV SOSY
PREFILLED_SYRINGE | INTRAVENOUS | Status: AC
  Filled 2023-11-27: qty 10

## 2023-11-27 MED ORDER — OXYCODONE HCL 5 MG PO TABS: 5.0000 mg | ORAL_TABLET | Freq: Once | ORAL | Status: DC | PRN

## 2023-11-27 MED ORDER — ROCURONIUM BROMIDE 100 MG/10ML IV SOLN
INTRAVENOUS | Status: DC | PRN
  Administered 2023-11-27: 40 mg via INTRAVENOUS

## 2023-11-27 MED ORDER — BUSPIRONE HCL 10 MG PO TABS
10.0000 mg | ORAL_TABLET | Freq: Two times a day (BID) | ORAL | Status: DC
  Administered 2023-11-28 – 2023-11-30 (×5): 10 mg via ORAL
  Filled 2023-11-27 (×6): qty 1

## 2023-11-27 MED ORDER — MIDAZOLAM HCL 2 MG/2ML IJ SOLN
INTRAMUSCULAR | Status: DC | PRN
Start: 2023-11-27 — End: 2023-11-27
  Administered 2023-11-27: 2 mg via INTRAVENOUS

## 2023-11-27 MED ORDER — SUCCINYLCHOLINE CHLORIDE 200 MG/10ML IV SOSY
PREFILLED_SYRINGE | INTRAVENOUS | Status: DC | PRN
  Administered 2023-11-27: 80 mg via INTRAVENOUS

## 2023-11-27 MED ORDER — OXYCODONE HCL 5 MG/5ML PO SOLN: 5.0000 mg | Freq: Once | ORAL | Status: DC | PRN

## 2023-11-27 MED ORDER — FLUTICASONE FUROATE-VILANTEROL 200-25 MCG/ACT IN AEPB
1.0000 | INHALATION_SPRAY | Freq: Every day | RESPIRATORY_TRACT | Status: DC
  Administered 2023-11-27 – 2023-11-30 (×4): 1 via RESPIRATORY_TRACT
  Filled 2023-11-27: qty 28

## 2023-11-27 MED ORDER — SUGAMMADEX SODIUM 200 MG/2ML IV SOLN
INTRAVENOUS | Status: DC | PRN
  Administered 2023-11-27: 120 mg via INTRAVENOUS

## 2023-11-27 MED ORDER — PROPOFOL 10 MG/ML IV BOLUS
INTRAVENOUS | Status: DC | PRN
  Administered 2023-11-27: 150 mg via INTRAVENOUS

## 2023-11-27 MED ORDER — POTASSIUM CHLORIDE 10 MEQ/100ML IV SOLN
10.0000 meq | INTRAVENOUS | Status: AC
  Administered 2023-11-27 (×4): 10 meq via INTRAVENOUS
  Filled 2023-11-27 (×4): qty 100

## 2023-11-27 MED ORDER — FENTANYL CITRATE (PF) 100 MCG/2ML IJ SOLN: 25.0000 ug | INTRAMUSCULAR | Status: DC | PRN

## 2023-11-27 MED ORDER — ACETAMINOPHEN 10 MG/ML IV SOLN
INTRAVENOUS | Status: AC
  Filled 2023-11-27: qty 100

## 2023-11-27 MED ORDER — SEVOFLURANE IN SOLN
RESPIRATORY_TRACT | Status: AC
  Filled 2023-11-27: qty 250

## 2023-11-27 MED ORDER — BUPIVACAINE-EPINEPHRINE (PF) 0.25% -1:200000 IJ SOLN
INTRAMUSCULAR | Status: DC | PRN
  Administered 2023-11-27: 50 mL via SUBCUTANEOUS

## 2023-11-27 MED ORDER — BUPIVACAINE-EPINEPHRINE (PF) 0.25% -1:200000 IJ SOLN
INTRAMUSCULAR | Status: AC
  Filled 2023-11-27: qty 30

## 2023-11-27 MED ORDER — FENTANYL CITRATE (PF) 100 MCG/2ML IJ SOLN
INTRAMUSCULAR | Status: DC | PRN
  Administered 2023-11-27 (×2): 50 ug via INTRAVENOUS

## 2023-11-27 MED ORDER — DEXAMETHASONE SODIUM PHOSPHATE 10 MG/ML IJ SOLN
INTRAMUSCULAR | Status: DC | PRN
  Administered 2023-11-27: 10 mg via INTRAVENOUS

## 2023-11-27 MED ORDER — LACTATED RINGERS IV SOLN: INTRAVENOUS | Status: DC | PRN

## 2023-11-27 MED ORDER — ONDANSETRON HCL 4 MG/2ML IJ SOLN
INTRAMUSCULAR | Status: DC | PRN
  Administered 2023-11-27: 4 mg via INTRAVENOUS

## 2023-11-27 MED ORDER — BUPIVACAINE LIPOSOME 1.3 % IJ SUSP
INTRAMUSCULAR | Status: AC
  Filled 2023-11-27: qty 20

## 2023-11-27 MED ORDER — LIDOCAINE HCL (CARDIAC) PF 100 MG/5ML IV SOSY
PREFILLED_SYRINGE | INTRAVENOUS | Status: DC | PRN
  Administered 2023-11-27: 60 mg via INTRAVENOUS

## 2023-11-27 MED ORDER — GABAPENTIN 300 MG PO CAPS
300.0000 mg | ORAL_CAPSULE | Freq: Two times a day (BID) | ORAL | Status: DC
  Administered 2023-11-28 – 2023-11-30 (×5): 300 mg via ORAL
  Filled 2023-11-27 (×6): qty 1

## 2023-11-27 MED ORDER — MAGNESIUM SULFATE 2 GM/50ML IV SOLN
2.0000 g | Freq: Once | INTRAVENOUS | Status: AC
  Administered 2023-11-27: 2 g via INTRAVENOUS
  Filled 2023-11-27: qty 50

## 2023-11-27 MED ORDER — PHENYLEPHRINE 80 MCG/ML (10ML) SYRINGE FOR IV PUSH (FOR BLOOD PRESSURE SUPPORT)
PREFILLED_SYRINGE | INTRAVENOUS | Status: DC | PRN
  Administered 2023-11-27 (×2): 160 ug via INTRAVENOUS

## 2023-11-27 MED ORDER — ACETAMINOPHEN 10 MG/ML IV SOLN
INTRAVENOUS | Status: DC | PRN
  Administered 2023-11-27: 1000 mg via INTRAVENOUS

## 2023-11-27 SURGICAL SUPPLY — 22 items
CHLORAPREP W/TINT 26 (MISCELLANEOUS) ×2 IMPLANT
DRAPE C-SECTION (MISCELLANEOUS) ×2 IMPLANT
DRAPE TABLE BACK 80X90 (DRAPES) ×2 IMPLANT
DRSG OPSITE POSTOP 4X8 (GAUZE/BANDAGES/DRESSINGS) IMPLANT
ELECT BLADE 6.5 EXT (BLADE) ×2 IMPLANT
ELECTRODE REM PT RTRN 9FT ADLT (ELECTROSURGICAL) ×2 IMPLANT
GAUZE 4X4 16PLY ~~LOC~~+RFID DBL (SPONGE) IMPLANT
GLOVE ORTHO TXT STRL SZ7.5 (GLOVE) ×4 IMPLANT
GOWN STRL REUS W/ TWL LRG LVL3 (GOWN DISPOSABLE) ×4 IMPLANT
GOWN STRL REUS W/ TWL XL LVL3 (GOWN DISPOSABLE) ×4 IMPLANT
HANDLE YANKAUER SUCT BULB TIP (MISCELLANEOUS) IMPLANT
MANIFOLD NEPTUNE II (INSTRUMENTS) ×2 IMPLANT
NS IRRIG 1000ML POUR BTL (IV SOLUTION) ×2 IMPLANT
PACK BASIN MAJOR ARMC (MISCELLANEOUS) ×2 IMPLANT
SPONGE T-LAP 18X18 ~~LOC~~+RFID (SPONGE) IMPLANT
STAPLER SKIN PROX 35W (STAPLE) ×2 IMPLANT
SUT PDS PLUS AB 0 CT-2 (SUTURE) IMPLANT
SUT SILK 3 0 SH CR/8 (SUTURE) ×2 IMPLANT
SUT VICRYL 2-0 54IN ABS (SUTURE) IMPLANT
TRAP FLUID SMOKE EVACUATOR (MISCELLANEOUS) ×2 IMPLANT
TRAY FOLEY MTR SLVR 16FR STAT (SET/KITS/TRAYS/PACK) ×2 IMPLANT
WATER STERILE IRR 500ML POUR (IV SOLUTION) ×2 IMPLANT

## 2023-11-27 NOTE — Anesthesia Procedure Notes (Signed)
 Procedure Name: Intubation Date/Time: 11/27/2023 1:44 AM  Performed by: Tod Handing, CRNAPre-anesthesia Checklist: Patient identified, Emergency Drugs available, Suction available and Patient being monitored Patient Re-evaluated:Patient Re-evaluated prior to induction Oxygen  Delivery Method: Circle system utilized Preoxygenation: Pre-oxygenation with 100% oxygen  Induction Type: IV induction, Cricoid Pressure applied and Rapid sequence Laryngoscope Size: Mac and 3 Grade View: Grade II Tube type: Oral Tube size: 6.5 mm Number of attempts: 1 Airway Equipment and Method: Stylet Placement Confirmation: ETT inserted through vocal cords under direct vision, positive ETCO2 and breath sounds checked- equal and bilateral Secured at: 19 cm Tube secured with: Tape Dental Injury: Teeth and Oropharynx as per pre-operative assessment

## 2023-11-27 NOTE — Transfer of Care (Signed)
 Immediate Anesthesia Transfer of Care Note  Patient: Summer Murphy  Procedure(s) Performed: LAPAROTOMY, EXPLORATORY lysis of adhesions  Patient Location: PACU  Anesthesia Type:General  Level of Consciousness: sedated  Airway & Oxygen  Therapy: Patient Spontanous Breathing and Patient connected to face mask oxygen   Post-op Assessment: Report given to RN and Post -op Vital signs reviewed and stable  Post vital signs: Reviewed  Last Vitals:  Vitals Value Taken Time  BP 121/60 11/27/23 03:06  Temp    Pulse 80   Resp 10 11/27/23 03:08  SpO2 100   Vitals shown include unfiled device data.  Last Pain:         Complications: No notable events documented.

## 2023-11-27 NOTE — Plan of Care (Signed)
  Problem: Coping: Goal: Ability to adjust to condition or change in health will improve Outcome: Progressing   Problem: Metabolic: Goal: Ability to maintain appropriate glucose levels will improve Outcome: Progressing   Problem: Clinical Measurements: Goal: Diagnostic test results will improve Outcome: Progressing   Problem: Coping: Goal: Level of anxiety will decrease Outcome: Progressing

## 2023-11-27 NOTE — Anesthesia Preprocedure Evaluation (Signed)
 Anesthesia Evaluation  Patient identified by MRN, date of birth, ID band Patient awake    Reviewed: Allergy & Precautions, NPO status , Patient's Chart, lab work & pertinent test results  History of Anesthesia Complications Negative for: history of anesthetic complications  Airway Mallampati: III  TM Distance: >3 FB Neck ROM: Full    Dental  (+) Missing, Poor Dentition, Dental Advidsory Given   Pulmonary neg pulmonary ROS, asthma , COPD   Pulmonary exam normal breath sounds clear to auscultation       Cardiovascular hypertension, + Peripheral Vascular Disease, +CHF and + DOE  Normal cardiovascular exam+ dysrhythmias + Valvular Problems/Murmurs  Rhythm:Regular Rate:Normal  ECG 02/22/23:  Normal sinus rhythm Non-specific intra-ventricular conduction block Cannot rule out Anterior infarct , age undetermined T wave abnormality, consider lateral ischemia  Myocardial perfusion 06/27/21:    Findings are consistent with no prior ischemia. The study is low risk.   No ST deviation was noted.   LV perfusion is equivocal. There is no evidence of ischemia. There is no evidence of infarction.   Left ventricular function is normal. End diastolic cavity size is normal. End systolic cavity size is normal.   There is a fixed septal wall defect likely due to left bundle branch block artifact.  The study is suboptimal due to this as well as extracardiac uptake.   CT attenuation images with no significant coronary calcifications    Neuro/Psych  Headaches PSYCHIATRIC DISORDERS Anxiety Depression    Chronic pain negative neurological ROS  negative psych ROS   GI/Hepatic negative GI ROS, Neg liver ROS, hiatal hernia,GERD  ,,  Endo/Other  negative endocrine ROSdiabetes, Type 2    Renal/GU Renal disease     Musculoskeletal   Abdominal   Peds  Hematology  (+) Blood dyscrasia, anemia   Anesthesia Other Findings Past Medical  History: No date: Acid reflux No date: Anemia No date: Anxiety     Comment:  a.) buspirone  BID + BZO PRN (clonazepam ) No date: Arthritis No date: Asthma No date: Cervical disc disorder with radiculopathy of cervical region No date: Chronic pain syndrome     Comment:  a.) on COT as prescribed by pain mangement No date: Chronic, continuous use of opioids     Comment:  a.) followed by pain mangement No date: COPD (chronic obstructive pulmonary disease) (HCC) No date: DOE (dyspnea on exertion) No date: Hematuria No date: Hiatal hernia No date: History of kidney stones No date: Hypertension No date: Hypoglycemia No date: Insomnia     Comment:  a.) uses Trazodone  PRN No date: LBBB (left bundle branch block) No date: Migraine No date: Murmur No date: Osteopenia No date: Palpitations No date: Pre-diabetes No date: Restless leg     Comment:  a.) on ropinirole  No date: Stenosis of cervical spine with myelopathy (HCC) No date: T2DM (type 2 diabetes mellitus) (HCC)  Past Surgical History: No date: ABDOMINAL HYSTERECTOMY 07/31/2016: AMPUTATION TOE; Left     Comment:  Procedure: AMPUTATION TOE/MPJ 2nd toe;  Surgeon: Neill Boas, DPM;  Location: [facility] ORS;  Service: Podiatry;                Laterality: Left; 1990: APPENDECTOMY No date: BACK SURGERY     Comment:  low back No date: BREAST SURGERY     Comment:  bilateral breast reduction No date: CARDIAC ELECTROPHYSIOLOGY STUDY AND ABLATION 1990: CHOLECYSTECTOMY 01/04/2017: COLONOSCOPY WITH PROPOFOL ; N/A  Comment:  Procedure: COLONOSCOPY WITH PROPOFOL ;  Surgeon: Viktoria Lamar DASEN, MD;  Location: [facility] ENDOSCOPY;  Service:               Endoscopy;  Laterality: N/A; No date: CORNEAL TRANSPLANT 04/09/2021: ESOPHAGOGASTRODUODENOSCOPY; N/A     Comment:  Procedure: ESOPHAGOGASTRODUODENOSCOPY (EGD);  Surgeon:               Unk Corinn Skiff, MD;  Location: [facility] ENDOSCOPY;                Service:  Gastroenterology;  Laterality: N/A; 01/04/2017: ESOPHAGOGASTRODUODENOSCOPY (EGD) WITH PROPOFOL ; N/A     Comment:  Procedure: ESOPHAGOGASTRODUODENOSCOPY (EGD) WITH               PROPOFOL ;  Surgeon: Viktoria Lamar DASEN, MD;  Location:               [facility] ENDOSCOPY;  Service: Endoscopy;  Laterality: N/A; 07/31/2016: EXCISION BONE CYST; Left     Comment:  Procedure: EXCISION BONE CYST/exostectomy 28124/left               2nd;  Surgeon: Neill Boas, DPM;  Location: [facility] ORS;                Service: Podiatry;  Laterality: Left; 09/12/2015: EXTRACORPOREAL SHOCK WAVE LITHOTRIPSY; Left     Comment:  Procedure: EXTRACORPOREAL SHOCK WAVE LITHOTRIPSY (ESWL);              Surgeon: Rosina Riis, MD;  Location: [facility] ORS;                Service: Urology;  Laterality: Left; No date: FRACTURE SURGERY     Comment:  left foot No date: GUM SURGERY; Left     Comment:  gum infection 11/14/2020: HARDWARE REMOVAL; Left     Comment:  Procedure: HARDWARE REMOVAL;  Surgeon: Kathlynn Sharper,               MD;  Location: [facility] ORS;  Service: Orthopedics;                Laterality: Left; No date: [facility] repair     Comment:  Fundoplication 2013,2014: JOINT REPLACEMENT; Bilateral     Comment:  total knees No date: LAPAROSCOPIC HYSTERECTOMY No date: LITHOTRIPSY 02/16/2020: periprosthetic supracondylar fracture of left femur     Comment:  [facility] 03/02/2023: POSTERIOR CERVICAL FUSION/FORAMINOTOMY; N/A     Comment:  Procedure: C3-6 POSTERIOR CERVICAL LAMINECTOMY AND               FUSION;  Surgeon: Claudene Riis ORN, MD;  Location: [facility]               ORS;  Service: Neurosurgery;  Laterality: N/A; No date: TONSILLECTOMY 11/14/2019: TOTAL KNEE REVISION; Right     Comment:  Procedure: Revision patella and tibial polyethylene;                Surgeon: Kathlynn Sharper, MD;  Location: [facility] ORS;                Service: Orthopedics;  Laterality: Right; No date: URETEROSCOPY  BMI    Body Mass Index: 22.18 kg/m       Reproductive/Obstetrics negative OB ROS                              Anesthesia Physical Anesthesia Plan  ASA: 4  and emergent  Anesthesia Plan: General ETT   Post-op Pain Management:    Induction: Intravenous  PONV Risk Score and Plan: 4 or greater and Ondansetron  and Dexamethasone   Airway Management Planned: Oral ETT  Additional Equipment:   Intra-op Plan:   Post-operative Plan: Possible Post-op intubation/ventilation and Extubation in OR  Informed Consent: I have reviewed the patients History and Physical, chart, labs and discussed the procedure including the risks, benefits and alternatives for the proposed anesthesia with the patient or authorized representative who has indicated his/her understanding and acceptance.     Dental Advisory Given  Plan Discussed with: Anesthesiologist, CRNA and Surgeon  Anesthesia Plan Comments: (Patient consented for risks of anesthesia including but not limited to:  - adverse reactions to medications - damage to eyes, teeth, lips or other oral mucosa - nerve damage due to positioning  - sore throat or hoarseness - Damage to heart, brain, nerves, lungs, other parts of body or loss of life  Patient voiced understanding and assent.)        Anesthesia Quick Evaluation

## 2023-11-27 NOTE — Progress Notes (Signed)
 PROGRESS NOTE    [name]  FMW:[id number] DOB: [birth year] DOA: 11/26/2023 PCP: Summer Fish, NP  Chief Complaint  Patient presents with   Abdominal Pain    Hospital Course:  Summer Murphy is a 68 y.o. female with medical history significant of GERD, Chronic pain syndrome, COPD, essential hypertension, multiple abdominal surgeries, anxiety disorder, asthma, type 2 diabetes, cervical spine myelopathy, migraine headaches, history of left bundle branch block who presents to the ER with abdominal pain, vomiting. Admitted for partial SBO, seen by general surgery.  Underwent exploratory laparotomy for lysis of adhesions on 09/06.  Hospital course as below  Subjective: Was examined at the bedside, new to me today.  No acute complaints at this time   Objective: Vitals:   11/27/23 0330 11/27/23 0345 11/27/23 0415 11/27/23 0754  BP: (!) 107/57 (!) 109/52 (!) 110/54 109/78  Pulse: (!) 102 91 92 75  Resp: 15 10 15 16   Temp:   98.2 F (36.8 C) 97.9 F (36.6 C)  TempSrc:   Oral   SpO2: 93% 95% 93% 100%  Height:        Intake/Output Summary (Last 24 hours) at 11/27/2023 9096 Last data filed at 11/27/2023 0600 Gross per 24 hour  Intake 1109.81 ml  Output 300 ml  Net 809.81 ml   There were no vitals filed for this visit.  Examination: Constitutional: NAD, calm, comfortable Neck: normal, supple, no masses, no thyromegaly Respiratory: clear to auscultation bilaterally, no wheezing, no crackles. Normal respiratory effort. No accessory muscle use.  Cardiovascular: RRR, no murmurs / rubs / gallops. No extremity edema. 2+ pedal pulses.  Abdomen: Soft, nontender, nondistended, dressing in place with minimal bloody staining.  No BS Skin: no rashes, lesions, ulcers. No induration Neurologic: CN 2-12 grossly intact. Sensation intact, DTR normal. Strength 5/5 in all 4.   Assessment & Plan:  Principal Problem:   Small bowel obstruction (HCC) Active Problems:   T2DM (type 2  diabetes mellitus) (HCC)   Essential hypertension   Anxiety and depression   Acid reflux   COPD (chronic obstructive pulmonary disease) (HCC)   Diaphragmatic hernia   Polyneuropathy associated with underlying disease (HCC)   Chronic asthmatic bronchitis (HCC)   Hypokalemia   Hyponatremia   Internal hernia  Small bowel obstruction, internal hernia S/p exploratory laparotomy lysis of adhesions - Lactic acid not elevated, leukocytosis resolved - CT shows abnormal distal small bowel with wall thickening and surrounding haziness/inflammation.  Stricture/area of narrowing with resulting SBO with proximal loops fluid-filled with scattered air-fluid levels - Seen by general surgery, appreciate recs  - S/p exploratory laparotomy lysis of band adhesions causing internal hernia 09/06 - NPO, IV fluids, NGT to LIWS - Eliquis  on hold - Pain management, antiemetics as needed  Hypokalemia - Replete magnesium  - Monitor and replete potassium with IV, also on LR with potassium  Type II DM - Not on meds at home, HbA1c pending - sliding scale insulin .  Continue to monitor.  GERD:  Continues PPIs   COPD: No acute exacerbation - Continue home inhalers  Normocytic anemia - Monitor Hb   Generalized anxiety - Continue buspirone , duloxetine    Polyneuropathy - Continue gabapentin   DVT prophylaxis: SCD   Code Status: Full Code Disposition:  TBD  Consultants:  Treatment Team:  Consulting Physician: Lane Shope, MD  Procedures:  exploratory laparotomy lysis of adhesions  Antimicrobials:  Anti-infectives (From admission, onward)    Start     Dose/Rate Route Frequency Ordered Stop   11/27/23 1200  ceFAZolin  (ANCEF ) IVPB 2g/100 mL premix        2 g 200 mL/hr over 30 Minutes Intravenous Every 8 hours 11/26/23 2344     11/27/23 1000  metroNIDAZOLE  (FLAGYL ) IVPB 500 mg        500 mg 100 mL/hr over 60 Minutes Intravenous Every 12 hours 11/26/23 2344     11/27/23 0045  metroNIDAZOLE   (FLAGYL ) IVPB 500 mg        500 mg 100 mL/hr over 60 Minutes Intravenous  Once 11/26/23 2353 11/27/23 0129   11/27/23 0045  ceFAZolin  (ANCEF ) IVPB 2g/100 mL premix        2 g 200 mL/hr over 30 Minutes Intravenous  Once 11/26/23 2353 11/27/23 0126       Data Reviewed: I have personally reviewed following labs and imaging studies CBC: Recent Labs  Lab 11/26/23 1739 11/27/23 0116  WBC 11.7* 9.7  HGB 9.2* 8.1*  HCT 29.0* 25.5*  MCV 80.8 81.2  PLT 567* 465*   Basic Metabolic Panel: Recent Labs  Lab 11/26/23 1739 11/27/23 0116  NA 134* 135  K 3.1* 3.1*  CL 94* 98  CO2 26 24  GLUCOSE 145* 122*  BUN 11 8  CREATININE 0.62 0.43*  CALCIUM 8.5* 7.8*   GFR: Estimated Creatinine Clearance: 49.7 mL/min (A) (by C-G formula based on SCr of 0.43 mg/dL (L)). Liver Function Tests: Recent Labs  Lab 11/26/23 1739 11/27/23 0116  AST 23 15  ALT 17 16  ALKPHOS 74 63  BILITOT 0.6 0.5  PROT 6.9 5.9*  ALBUMIN 2.7* 2.5*   CBG: Recent Labs  Lab 11/26/23 2238 11/27/23 0346 11/27/23 0755  GLUCAP 137* 160* 157*    Recent Results (from the past 240 hours)  MRSA Next Gen by PCR, Nasal     Status: None   Collection Time: 11/27/23  1:08 AM   Specimen: Nasal Mucosa; Nasal Swab  Result Value Ref Range Status   MRSA by PCR Next Gen NOT DETECTED NOT DETECTED Final    Comment: (NOTE) The GeneXpert MRSA Assay (FDA approved for NASAL specimens only), is one component of a comprehensive MRSA colonization surveillance program. It is not intended to diagnose MRSA infection nor to guide or monitor treatment for MRSA infections. Test performance is not FDA approved in patients less than 51 years old. Performed at [facility], [address] [place], [place] [address]      Radiology Studies: DG Abd Portable 1V Result Date: 11/26/2023 CLINICAL DATA:  NG tube placement EXAM: PORTABLE ABDOMEN - 1 VIEW COMPARISON:  CT today FINDINGS: NG tube tip near the GE junction, possibly  within the hiatal hernia seen on CT. IMPRESSION: NG tube tip likely within the hiatal hernia. Electronically Signed   By: Franky Crease M.D.   On: 11/26/2023 23:26   CT ABDOMEN PELVIS W CONTRAST Result Date: 11/26/2023 CLINICAL DATA:  Abdominal pain, vomiting EXAM: CT ABDOMEN AND PELVIS WITH CONTRAST TECHNIQUE: Multidetector CT imaging of the abdomen and pelvis was performed using the standard protocol following bolus administration of intravenous contrast. RADIATION DOSE REDUCTION: This exam was performed according to the departmental dose-optimization program which includes automated exposure control, adjustment of the mA and/or kV according to patient size and/or use of iterative reconstruction technique. CONTRAST:  OMNIPAQUE  IOHEXOL  300 MG/ML  SOLN COMPARISON:  04/24/2023 FINDINGS: Lower chest: Large hiatal hernia, stable.  No acute findings. Hepatobiliary: No focal liver abnormality is seen. Status post cholecystectomy. No biliary dilatation. Spleen is enlarged measuring 20 cm  in craniocaudal length. This could reflect hepatomegaly or a Riedel's lobe. Pancreas: Fatty replacement. No focal abnormality or ductal dilatation. Spleen: No focal abnormality.  Normal size. Adrenals/Urinary Tract: 3 mm nonobstructing stone in the upper pole of the right kidney. No ureteral stones or hydronephrosis. No suspicious renal or adrenal abnormality. Urinary bladder unremarkable. Stomach/Bowel: Sigmoid diverticulosis. No active diverticulitis. Abnormal appearing segments of distal small bowel in the pelvis with mild wall thickening and surrounding inflammation. There appears to be an area of narrowing and stricture best seen on images 40 6-47 of coronal series 5. This is immediately adjacent to the abnormally thickened appearing small bowel loop. Differential considerations would include adhesions in this area or inflammatory stricture. It is difficult to completely exclude internal hernia and ischemia. Small bowel loops  proximal to this area are dilated with air-fluid levels compatible with a degree of small-bowel obstruction. Vascular/Lymphatic: No evidence of aneurysm or adenopathy. Reproductive: Prior hysterectomy.  No adnexal masses. Other: No free fluid or free air. Musculoskeletal: No acute bony abnormality. IMPRESSION: Abnormal appearing distal small bowel within the pelvis with wall thickening and surrounding haziness/inflammation. Differential considerations would include enteritis (infectious, inflammatory or ischemic). There appears to be a stricture/area of narrowing with resulting small bowel obstruction with proximal loops fluid-filled with scattered air-fluid levels. While this could be related to enteritis and stricture or adhesions, cannot exclude internal hernia. Recommend surgical consultation. Liver measures enlarged at 20 cm in could reflect hepatomegaly or Riedel's lobe. Large hiatal hernia. These results were called by telephone at the time of interpretation on 11/26/2023 at 7:59 pm to provider KEVIN PADUCHOWSKI , who verbally acknowledged these results. Electronically Signed   By: Franky Crease M.D.   On: 11/26/2023 19:59    Scheduled Meds:  insulin  aspart  0-5 Units Subcutaneous QHS   insulin  aspart  0-9 Units Subcutaneous TID WC   pantoprazole  (PROTONIX ) IV  40 mg Intravenous Q24H   Continuous Infusions:   ceFAZolin  (ANCEF ) IV     lactated ringers  1,000 mL with potassium chloride  20 mEq infusion Stopped (11/27/23 0128)   metronidazole  500 mg (11/27/23 0817)     LOS: 1 day  MDM: Patient is high risk for one or more organ failure.  They necessitate ongoing hospitalization for continued IV therapies and subsequent lab monitoring. Total time spent interpreting labs and vitals, reviewing the medical record, coordinating care amongst consultants and care team members, directly assessing and discussing care with the patient and/or family: 55 min Laree Lock, MD [facility]  To contact  the attending physician between 7A-7P please use Epic Chat. To contact the covering physician during after hours 7P-7A, please review Amion.  11/27/2023, 9:03 AM   *This document has been created with the assistance of dictation software. Please excuse typographical errors. *

## 2023-11-27 NOTE — Op Note (Signed)
 Exploratory laparotomy lysis of band adhesion causing internal hernia.  Pre-operative Diagnosis: Internal hernia, small bowel obstruction  Post-operative Diagnosis: same.    Surgeon: Honor Leghorn, M.D., [facility]  Anesthesia: General Endotracheal  Findings: Post appendectomy band adhesion to sigmoid colon, creating internal loop/hernia through which 1 foot distal ileum had been incarcerated.  This section was viable though hyperemic and congested.  Estimated Blood Loss: 25 mL         Specimens: None          Complications: none              Procedure Details  The patient was seen again in the Holding Room. The benefits, complications, treatment options, and expected outcomes were discussed with the patient. The risks of bleeding, infection, recurrence of symptoms, failure to resolve symptoms, unanticipated injury, prosthetic placement, prosthetic infection, any of which could require further surgery were reviewed with the patient. The likelihood of improving the patient's symptoms with return to their baseline status is hopeful.  The patient and/or family concurred with the proposed plan, giving informed consent.  The patient was taken to Operating Room, identified and the procedure verified.    Prior to the induction of general anesthesia, antibiotic prophylaxis was administered. VTE prophylaxis was in place.  General anesthesia was then administered and tolerated well. After the induction, the patient was positioned in the supine position and the abdomen was prepped with  Chloraprep and draped in the sterile fashion.  A Time Out was held and the above information confirmed.  Midline incision was made adjacent to the old scar.  We carefully entered the peritoneum at the umbilical region, finding no anterior abdominal wall adhesions aside from the epigastric omental tissues.  The small bowel and transverse colon were readily appreciable and nonadherent adherent.  I eviscerated the proximal  small bowel identified the ligament of Treitz and followed the small bowel distally to the point where it disappeared into the internal hernia ring.  Identified the band adhesion that created the ring, this adhesion approximately an inch wide was divided with electrosurgery freeing of foot loop of distal ileum that had been incarcerated in it with impending strangulation.  There is no question to its viability.  There were no defects in the serosa, no near perforation, no evidence of gangrenous foci.  We then ensured this was the only area of concern.  I was able to palpate the NG tube in the decompressed stomach, though it was partition by the adhesive omentum to the upper abdominal anterior wall.  We then irrigated the abdomen with normal saline solution, confirmed adequate hemostasis.  I then infiltrated bilateral abdominal walls with a mixture of 20 mL of Exparel  with 30 mL of Marcaine  with epinephrine  for postoperative pain control.  Then reapproximated the midline fascia with running 0 PDS.  I removed the umbilical skin and the adjacent scarred skin.  We then reapproximated the skin with staples.  Honeycomb dressing was applied.  She tolerated the procedure well. Subsequently extubated, and transferred to recovery room in stable condition.      Honor Leghorn M.D., [facility] 11/27/2023 3:05 AM

## 2023-11-27 NOTE — Progress Notes (Addendum)
 [facility] SURGICAL PROGRESS NOTE  Hospital Day(s): 1.   Post op day(s): * Day of Surgery *.   Interval History: Patient seen and examined, no acute events or new complaints overnight. Patient reports minimal/expected incisional pain.  Foley in place, excellent clear urine output.  NG tube in place, minimal output.  Review of Systems:  Constitutional: denies fever, chills  Respiratory: denies any shortness of breath  Cardiovascular: denies chest pain or palpitations  Musculoskeletal: denies decreased motor or sensation Integumentary: denies any other rashes or skin discolorations  Vital signs in last 24 hours: [min-max] current  Temp:  [97.7 F (36.5 C)-99.6 F (37.6 C)] 97.9 F (36.6 C) (09/06 0754) Pulse Rate:  [75-107] 75 (09/06 0754) Resp:  [10-28] 16 (09/06 0754) BP: (96-136)/(48-111) 109/78 (09/06 0754) SpO2:  [93 %-100 %] 100 % (09/06 0754)     Height: 4' 11 (149.9 cm)       Intake/Output last 2 shifts:  09/05 0701 - 09/06 0700 In: 1109.8 [I.V.:850.3; IV Piggyback:259.5] Out: 300 [Urine:300]   Physical Exam:  Constitutional: alert, cooperative and no distress  Respiratory: breathing non-labored at rest  Cardiovascular: regular rate, well-perfused Gastrointestinal: soft, non-tender, and non-distended Integumentary: Honeycomb dressing in place minimal blood staining centrally.  No surprise considering perioperative Eliquis .  Labs:     Latest Ref Rng & Units 11/27/2023    1:16 AM 11/26/2023    5:39 PM 11/11/2023    8:12 PM  CBC  WBC 4.0 - 10.5 K/uL 9.7  11.7  8.4   Hemoglobin 12.0 - 15.0 g/dL 8.1  9.2  9.5   Hematocrit 36.0 - 46.0 % 25.5  29.0  30.0   Platelets 150 - 400 K/uL 465  567  385       Latest Ref Rng & Units 11/27/2023    1:16 AM 11/26/2023    5:39 PM 11/17/2023    1:24 PM  CMP  Glucose 70 - 99 mg/dL 877  854  753   BUN 8 - 23 mg/dL 8  11  12    Creatinine 0.44 - 1.00 mg/dL 9.56  9.37  9.49   Sodium 135 - 145 mmol/L 135  134  138    Potassium 3.5 - 5.1 mmol/L 3.1  3.1  3.4   Chloride 98 - 111 mmol/L 98  94  98   CO2 22 - 32 mmol/L 24  26  27    Calcium 8.9 - 10.3 mg/dL 7.8  8.5  8.8   Total Protein 6.5 - 8.1 g/dL 5.9  6.9    Total Bilirubin 0.0 - 1.2 mg/dL 0.5  0.6    Alkaline Phos 38 - 126 U/L 63  74    AST 15 - 41 U/L 15  23    ALT 0 - 44 U/L 16  17       Imaging studies: No new pertinent imaging studies   Assessment/Plan:  68 y.o. female with internal hernia with impending strangulation* Day of Surgery * s/p ex lap with lysis of band adhesion, complicated by pertinent comorbidities including  Patient Active Problem List   Diagnosis Date Noted   Internal hernia 11/27/2023   Hyponatremia 11/26/2023   Small bowel obstruction (HCC) 11/26/2023   Hypokalemia 10/29/2023   Age-related osteoporosis without current pathological fracture 09/20/2023   Vertebral osteomyelitis (HCC) 04/26/2023   Lumbar discitis 04/26/2023   Infection of cervical spine (HCC) 04/26/2023   Surgical site infection 04/19/2023   Falls 04/16/2023   Chronic pain syndrome  S/P cervical spinal fusion 04/12/2023   Cervical myelopathy (HCC) 03/02/2023   T2DM (type 2 diabetes mellitus) (HCC) 02/28/2023   Stenosis of cervical spine with myelopathy (HCC) 02/04/2023   Cervical radicular pain (left severe) 12/10/2022   Foraminal stenosis of cervical region 11/25/2022   Cervical disc disorder with radiculopathy of cervical region 11/25/2022   Cervicogenic headache 11/25/2022   Chronic asthmatic bronchitis (HCC) 06/12/2022   Anxiety and depression 06/02/2022   Essential hypertension 06/02/2022   Asthma 06/01/2022   DOE (dyspnea on exertion) 05/13/2022   Hypocalcemia 07/31/2021   Esophageal dysphagia    Benign esophageal stricture    S/P hardware removal 11/14/2020   Iron  deficiency anemia 10/18/2020   S/P revision of total knee, right 11/14/2019   Seasonal allergic rhinitis due to pollen 09/20/2019   Callus of foot 08/13/2019   Restless  leg 04/30/2019   Diastolic dysfunction 12/14/2018   Acid reflux 12/14/2018   Recurrent sinusitis 08/15/2018   Migraine 04/22/2018   Nausea 04/22/2018   Polyneuropathy associated with underlying disease (HCC) 04/22/2018   Midline low back pain without sciatica 04/22/2018   Allergic rhinitis 07/01/2017   Moderate asthma without complication 01/20/2016   COPD (chronic obstructive pulmonary disease) (HCC) 09/10/2014   Calculi, ureter 04/26/2013   Corneal graft malfunction 08/17/2012   Calculus of kidney 04/26/2012   Urge incontinence 04/26/2012   Diaphragmatic hernia 10/27/2010   Barrett esophagus 07/07/2010    - Out of caution we will continue NG tube to low wall/intermittent suction.  - I believe the Foley catheter can be discontinued.  - No need for postoperative prophylactic antibiotics.  - Will continue to monitor for return of bowel function, potential ileus may follow  - Mild hypokalemia, could potentially supplement via NG tube, or IV fluids  All of the above findings and recommendations were discussed with the patient, and all of patient's questions were answered to their expressed satisfaction.  -- Honor Leghorn, M.D., [facility] 11/27/2023

## 2023-11-28 DIAGNOSIS — K56609 Unspecified intestinal obstruction, unspecified as to partial versus complete obstruction: Secondary | ICD-10-CM | POA: Diagnosis not present

## 2023-11-28 LAB — MAGNESIUM: Magnesium: 1.9 mg/dL (ref 1.7–2.4)

## 2023-11-28 LAB — BASIC METABOLIC PANEL WITH GFR
Anion gap: 12 (ref 5–15)
BUN: 12 mg/dL (ref 8–23)
CO2: 27 mmol/L (ref 22–32)
Calcium: 8 mg/dL — ABNORMAL LOW (ref 8.9–10.3)
Chloride: 100 mmol/L (ref 98–111)
Creatinine, Ser: 0.39 mg/dL — ABNORMAL LOW (ref 0.44–1.00)
GFR, Estimated: >60 mL/min (ref >60)
Glucose, Bld: 94 mg/dL (ref 70–99)
Potassium: 3.9 mmol/L (ref 3.5–5.1)
Sodium: 139 mmol/L (ref 135–145)

## 2023-11-28 LAB — GLUCOSE, CAPILLARY
Glucose-Capillary: 88 mg/dL (ref 70–99)
Glucose-Capillary: 113 mg/dL — ABNORMAL HIGH (ref 70–99)
Glucose-Capillary: 67 mg/dL — ABNORMAL LOW (ref 70–99)
Glucose-Capillary: 115 mg/dL — ABNORMAL HIGH (ref 70–99)
Glucose-Capillary: 113 mg/dL — ABNORMAL HIGH (ref 70–99)

## 2023-11-28 LAB — CBC
HCT: 23.7 % — ABNORMAL LOW (ref 36.0–46.0)
Hemoglobin: 7.4 g/dL — ABNORMAL LOW (ref 12.0–15.0)
MCH: 25.3 pg — ABNORMAL LOW (ref 26.0–34.0)
MCHC: 31.2 g/dL (ref 30.0–36.0)
MCV: 81.2 fL (ref 80.0–100.0)
Platelets: 461 K/uL — ABNORMAL HIGH (ref 150–400)
RBC: 2.92 MIL/uL — ABNORMAL LOW (ref 3.87–5.11)
RDW: 15.7 % — ABNORMAL HIGH (ref 11.5–15.5)
WBC: 13.9 K/uL — ABNORMAL HIGH (ref 4.0–10.5)
nRBC: 0 % (ref 0.0–0.2)

## 2023-11-28 MED ORDER — OXYCODONE HCL 5 MG PO TABS
5.0000 mg | ORAL_TABLET | ORAL | Status: DC | PRN
  Administered 2023-11-28: 5 mg via ORAL
  Filled 2023-11-28: qty 1

## 2023-11-28 MED ORDER — DEXTROSE 50 % IV SOLN
INTRAVENOUS | Status: AC
  Filled 2023-11-28: qty 50

## 2023-11-28 MED ORDER — DEXTROSE 50 % IV SOLN
12.5000 g | INTRAVENOUS | Status: AC
  Administered 2023-11-28: 12.5 g via INTRAVENOUS

## 2023-11-28 MED ORDER — OXYCODONE-ACETAMINOPHEN 5-325 MG PO TABS
1.0000 | ORAL_TABLET | ORAL | Status: DC | PRN
  Administered 2023-11-28 – 2023-11-30 (×11): 1 via ORAL
  Filled 2023-11-28 (×11): qty 1

## 2023-11-28 MED ORDER — DIPHENHYDRAMINE HCL 50 MG/ML IJ SOLN
12.5000 mg | Freq: Four times a day (QID) | INTRAMUSCULAR | Status: DC | PRN
  Filled 2023-11-28: qty 1

## 2023-11-28 MED ORDER — LACTATED RINGERS IV SOLN: INTRAVENOUS | Status: DC

## 2023-11-28 MED ORDER — OXYCODONE HCL 5 MG PO TABS: 2.5000 mg | ORAL_TABLET | ORAL | Status: DC | PRN

## 2023-11-28 MED ORDER — HYDROXYZINE HCL 50 MG PO TABS
50.0000 mg | ORAL_TABLET | Freq: Three times a day (TID) | ORAL | Status: DC | PRN
  Administered 2023-11-28 – 2023-11-29 (×2): 50 mg via ORAL
  Filled 2023-11-28 (×3): qty 1

## 2023-11-28 MED ORDER — ACETAMINOPHEN 325 MG PO TABS: 650.0000 mg | ORAL_TABLET | Freq: Four times a day (QID) | ORAL | Status: DC | PRN

## 2023-11-28 NOTE — Plan of Care (Signed)

## 2023-11-28 NOTE — Progress Notes (Signed)
 [facility] SURGICAL PROGRESS NOTE  Hospital Day(s): 2.   Post op day(s): 1 Day Post-Op.   Interval History: Patient seen and examined, new complaints involve incisional pain.   NG tube in place, minimal output.  NG removed by myself.  Review of Systems:  Constitutional: denies fever, chills  Respiratory: denies any shortness of breath  Cardiovascular: denies chest pain or palpitations  Musculoskeletal: denies decreased motor or sensation Integumentary: denies any other rashes or skin discolorations  Vital signs in last 24 hours: [min-max] current  Temp:  [98 F (36.7 C)-99.1 F (37.3 C)] 98.1 F (36.7 C) (09/07 0751) Pulse Rate:  [75-101] 101 (09/07 0751) Resp:  [16-20] 20 (09/07 0751) BP: (119-140)/(62-72) 119/71 (09/07 0751) SpO2:  [98 %-100 %] 98 % (09/07 0751)     Height: 4' 11 (149.9 cm)       Intake/Output last 2 shifts:  09/06 0701 - 09/07 0700 In: 400 [IV Piggyback:400] Out: 950 [Urine:950]   Physical Exam:  Constitutional: alert, cooperative and no distress  Respiratory: breathing non-labored at rest  Cardiovascular: regular rate, well-perfused Gastrointestinal: soft, non-tender, and non-distended Integumentary: Honeycomb dressing in place blood staining centrally.    Labs:     Latest Ref Rng & Units 11/28/2023    6:49 AM 11/27/2023    1:16 AM 11/26/2023    5:39 PM  CBC  WBC 4.0 - 10.5 K/uL 13.9  9.7  11.7   Hemoglobin 12.0 - 15.0 g/dL 7.4  8.1  9.2   Hematocrit 36.0 - 46.0 % 23.7  25.5  29.0   Platelets 150 - 400 K/uL 461  465  567       Latest Ref Rng & Units 11/28/2023    6:49 AM 11/27/2023   10:26 PM 11/27/2023    1:16 AM  CMP  Glucose 70 - 99 mg/dL 94   877   BUN 8 - 23 mg/dL 12   8   Creatinine 9.55 - 1.00 mg/dL 9.60   9.56   Sodium 864 - 145 mmol/L 139   135   Potassium 3.5 - 5.1 mmol/L 3.9  3.7  3.1   Chloride 98 - 111 mmol/L 100   98   CO2 22 - 32 mmol/L 27   24   Calcium 8.9 - 10.3 mg/dL 8.0   7.8   Total Protein 6.5 - 8.1 g/dL    5.9   Total Bilirubin 0.0 - 1.2 mg/dL   0.5   Alkaline Phos 38 - 126 U/L   63   AST 15 - 41 U/L   15   ALT 0 - 44 U/L   16      Imaging studies: No new pertinent imaging studies   Assessment/Plan:  68 y.o. female with internal hernia with impending strangulation 1 Day Post-Op s/p ex lap with lysis of band adhesion, complicated by pertinent comorbidities including  Patient Active Problem List   Diagnosis Date Noted   Internal hernia 11/27/2023   Hyponatremia 11/26/2023   Small bowel obstruction (HCC) 11/26/2023   Hypokalemia 10/29/2023   Age-related osteoporosis without current pathological fracture 09/20/2023   Vertebral osteomyelitis (HCC) 04/26/2023   Lumbar discitis 04/26/2023   Infection of cervical spine (HCC) 04/26/2023   Surgical site infection 04/19/2023   Falls 04/16/2023   Chronic pain syndrome    S/P cervical spinal fusion 04/12/2023   Cervical myelopathy (HCC) 03/02/2023   T2DM (type 2 diabetes mellitus) (HCC) 02/28/2023   Stenosis of cervical spine with myelopathy (HCC) 02/04/2023  Cervical radicular pain (left severe) 12/10/2022   Foraminal stenosis of cervical region 11/25/2022   Cervical disc disorder with radiculopathy of cervical region 11/25/2022   Cervicogenic headache 11/25/2022   Chronic asthmatic bronchitis (HCC) 06/12/2022   Anxiety and depression 06/02/2022   Essential hypertension 06/02/2022   Asthma 06/01/2022   DOE (dyspnea on exertion) 05/13/2022   Hypocalcemia 07/31/2021   Esophageal dysphagia    Benign esophageal stricture    S/P hardware removal 11/14/2020   Iron  deficiency anemia 10/18/2020   S/P revision of total knee, right 11/14/2019   Seasonal allergic rhinitis due to pollen 09/20/2019   Callus of foot 08/13/2019   Restless leg 04/30/2019   Diastolic dysfunction 12/14/2018   Acid reflux 12/14/2018   Recurrent sinusitis 08/15/2018   Migraine 04/22/2018   Nausea 04/22/2018   Polyneuropathy associated with underlying disease  (HCC) 04/22/2018   Midline low back pain without sciatica 04/22/2018   Allergic rhinitis 07/01/2017   Moderate asthma without complication 01/20/2016   COPD (chronic obstructive pulmonary disease) (HCC) 09/10/2014   Calculi, ureter 04/26/2013   Corneal graft malfunction 08/17/2012   Calculus of kidney 04/26/2012   Urge incontinence 04/26/2012   Diaphragmatic hernia 10/27/2010   Barrett esophagus 07/07/2010    - Will initiate cautious clear liquid diet.  - Will continue Ancef  and Flagyl , due to postoperative likely reactive leukocytosis.  - Will continue to monitor for return of bowel function, potential ileus may follow  - Made some adjustments in her pain medication, within the parameters of her allergies.  Adding Benadryl , and changing oxycodone  to Percocet.  All of the above findings and recommendations were discussed with the patient, and all of patient's questions were answered to their expressed satisfaction.  -- Honor Leghorn, M.D., [facility] 11/28/2023

## 2023-11-28 NOTE — Progress Notes (Signed)
 PROGRESS NOTE    Summer Murphy  FMW:[id number] DOB: Sep 22, 1955 DOA: 11/26/2023 PCP: Liana Fish, NP  Chief Complaint  Patient presents with   Abdominal Pain    Hospital Course:  Summer Murphy is a 68 y.o. female with medical history significant of GERD, Chronic pain syndrome, COPD, essential hypertension, multiple abdominal surgeries, anxiety disorder, asthma, type 2 diabetes, cervical spine myelopathy, migraine headaches, history of left bundle branch block who presents to the ER with abdominal pain, vomiting. Admitted for partial SBO, seen by general surgery.  Underwent exploratory laparotomy for lysis of adhesions on 09/06.  Hospital course as below  Subjective: Was examined at the bedside, states has pain at the operative site, NG tube removed by Surgery.  Also reports knee pain, started Percocet    Objective: Vitals:   11/27/23 2047 11/28/23 0423 11/28/23 0751 11/28/23 1521  BP: 123/62 (!) 140/72 119/71 132/82  Pulse: 91 99 (!) 101 (!) 108  Resp: 20 18 20 17   Temp: 98 F (36.7 C) 99.1 F (37.3 C) 98.1 F (36.7 C) 98.6 F (37 C)  TempSrc: Oral Oral Oral   SpO2: 100% 98% 98% 100%  Height:        Intake/Output Summary (Last 24 hours) at 11/28/2023 1714 Last data filed at 11/28/2023 1600 Gross per 24 hour  Intake 972.7 ml  Output 1250 ml  Net -277.3 ml   There were no vitals filed for this visit.  Examination: Constitutional: NAD, calm, comfortable Neck: normal, supple, no masses, no thyromegaly Respiratory: clear to auscultation bilaterally, no wheezing, no crackles. Normal respiratory effort. No accessory muscle use.  Cardiovascular: RRR, no murmurs / rubs / gallops. No extremity edema. 2+ pedal pulses.  Abdomen: Soft, nontender, nondistended, dressing in place with minimal bloody staining.  No BS Skin: no rashes, lesions, ulcers. No induration Neurologic: CN 2-12 grossly intact. Sensation intact, DTR normal. Strength 5/5 in all 4.   Assessment & Plan:   Principal Problem:   Small bowel obstruction (HCC) Active Problems:   T2DM (type 2 diabetes mellitus) (HCC)   Essential hypertension   Anxiety and depression   Acid reflux   COPD (chronic obstructive pulmonary disease) (HCC)   Diaphragmatic hernia   Polyneuropathy associated with underlying disease (HCC)   Chronic asthmatic bronchitis (HCC)   Hypokalemia   Hyponatremia   Internal hernia  Small bowel obstruction, internal hernia S/p exploratory laparotomy lysis of adhesions - Lactic acid not elevated, leukocytosis resolved - CT shows abnormal distal small bowel with wall thickening and surrounding haziness/inflammation.  Stricture/area of narrowing with resulting SBO with proximal loops fluid-filled with scattered air-fluid levels - Seen by general surgery, appreciate recs  - S/p exploratory laparotomy lysis of band adhesions causing internal hernia 09/06 - NGT removed, started clear liquid diet, continue IV fluids - Eliquis  on hold - Pain management (oxycodone  changed to Percocet), antiemetics as needed  Leukocytosis -likely reactive - Continue postop Ancef , Flagyl   Hypokalemia Hypomagnesemia - Resolved  Normocytic anemia - Monitor Hb - Transfuse if Hb < 7, no evidence of bleeding except bloody stain around the incision site  Type II DM - Not on meds at home, HbA1c pending - sliding scale insulin .  Continue to monitor.  GERD:  Continues PPIs   COPD: No acute exacerbation - Continue home inhalers   Generalized anxiety - Continue buspirone , duloxetine    Polyneuropathy - Continue gabapentin   DVT prophylaxis: SCD   Code Status: Full Code Disposition:  TBD  Consultants:  Treatment Team:  Consulting Physician:  Lane Shope, MD  Procedures:  exploratory laparotomy lysis of adhesions  Antimicrobials:  Anti-infectives (From admission, onward)    Start     Dose/Rate Route Frequency Ordered Stop   11/27/23 1200  ceFAZolin  (ANCEF ) IVPB 2g/100 mL premix         2 g 200 mL/hr over 30 Minutes Intravenous Every 8 hours 11/26/23 2344     11/27/23 1000  metroNIDAZOLE  (FLAGYL ) IVPB 500 mg        500 mg 100 mL/hr over 60 Minutes Intravenous Every 12 hours 11/26/23 2344     11/27/23 0045  metroNIDAZOLE  (FLAGYL ) IVPB 500 mg        500 mg 100 mL/hr over 60 Minutes Intravenous  Once 11/26/23 2353 11/27/23 0129   11/27/23 0045  ceFAZolin  (ANCEF ) IVPB 2g/100 mL premix        2 g 200 mL/hr over 30 Minutes Intravenous  Once 11/26/23 2353 11/27/23 0126       Data Reviewed: I have personally reviewed following labs and imaging studies CBC: Recent Labs  Lab 11/26/23 1739 11/27/23 0116 11/28/23 0649  WBC 11.7* 9.7 13.9*  HGB 9.2* 8.1* 7.4*  HCT 29.0* 25.5* 23.7*  MCV 80.8 81.2 81.2  PLT 567* 465* 461*   Basic Metabolic Panel: Recent Labs  Lab 11/26/23 1739 11/27/23 0116 11/27/23 2226 11/28/23 0649  NA 134* 135  --  139  K 3.1* 3.1* 3.7 3.9  CL 94* 98  --  100  CO2 26 24  --  27  GLUCOSE 145* 122*  --  94  BUN 11 8  --  12  CREATININE 0.62 0.43*  --  0.39*  CALCIUM 8.5* 7.8*  --  8.0*  MG  --  1.5* 2.2 1.9   GFR: Estimated Creatinine Clearance: 49.7 mL/min (A) (by C-G formula based on SCr of 0.39 mg/dL (L)). Liver Function Tests: Recent Labs  Lab 11/26/23 1739 11/27/23 0116  AST 23 15  ALT 17 16  ALKPHOS 74 63  BILITOT 0.6 0.5  PROT 6.9 5.9*  ALBUMIN 2.7* 2.5*   CBG: Recent Labs  Lab 11/27/23 1620 11/27/23 2118 11/28/23 0938 11/28/23 1147 11/28/23 1214  GLUCAP 120* 124* 88 67* 113*    Recent Results (from the past 240 hours)  MRSA Next Gen by PCR, Nasal     Status: None   Collection Time: 11/27/23  1:08 AM   Specimen: Nasal Mucosa; Nasal Swab  Result Value Ref Range Status   MRSA by PCR Next Gen NOT DETECTED NOT DETECTED Final    Comment: (NOTE) The GeneXpert MRSA Assay (FDA approved for NASAL specimens only), is one component of a comprehensive MRSA colonization surveillance program. It is not intended to  diagnose MRSA infection nor to guide or monitor treatment for MRSA infections. Test performance is not FDA approved in patients less than 60 years old. Performed at [facility], [address] [place], [place] [address]      Radiology Studies: DG Abd Portable 1V Result Date: 11/26/2023 CLINICAL DATA:  NG tube placement EXAM: PORTABLE ABDOMEN - 1 VIEW COMPARISON:  CT today FINDINGS: NG tube tip near the GE junction, possibly within the hiatal hernia seen on CT. IMPRESSION: NG tube tip likely within the hiatal hernia. Electronically Signed   By: Franky Crease M.D.   On: 11/26/2023 23:26   CT ABDOMEN PELVIS W CONTRAST Result Date: 11/26/2023 CLINICAL DATA:  Abdominal pain, vomiting EXAM: CT ABDOMEN AND PELVIS WITH CONTRAST TECHNIQUE: Multidetector CT imaging of the  abdomen and pelvis was performed using the standard protocol following bolus administration of intravenous contrast. RADIATION DOSE REDUCTION: This exam was performed according to the departmental dose-optimization program which includes automated exposure control, adjustment of the mA and/or kV according to patient size and/or use of iterative reconstruction technique. CONTRAST:  OMNIPAQUE  IOHEXOL  300 MG/ML  SOLN COMPARISON:  04/24/2023 FINDINGS: Lower chest: Large hiatal hernia, stable.  No acute findings. Hepatobiliary: No focal liver abnormality is seen. Status post cholecystectomy. No biliary dilatation. Spleen is enlarged measuring 20 cm in craniocaudal length. This could reflect hepatomegaly or a Riedel's lobe. Pancreas: Fatty replacement. No focal abnormality or ductal dilatation. Spleen: No focal abnormality.  Normal size. Adrenals/Urinary Tract: 3 mm nonobstructing stone in the upper pole of the right kidney. No ureteral stones or hydronephrosis. No suspicious renal or adrenal abnormality. Urinary bladder unremarkable. Stomach/Bowel: Sigmoid diverticulosis. No active diverticulitis. Abnormal appearing segments of distal  small bowel in the pelvis with mild wall thickening and surrounding inflammation. There appears to be an area of narrowing and stricture best seen on images 40 6-47 of coronal series 5. This is immediately adjacent to the abnormally thickened appearing small bowel loop. Differential considerations would include adhesions in this area or inflammatory stricture. It is difficult to completely exclude internal hernia and ischemia. Small bowel loops proximal to this area are dilated with air-fluid levels compatible with a degree of small-bowel obstruction. Vascular/Lymphatic: No evidence of aneurysm or adenopathy. Reproductive: Prior hysterectomy.  No adnexal masses. Other: No free fluid or free air. Musculoskeletal: No acute bony abnormality. IMPRESSION: Abnormal appearing distal small bowel within the pelvis with wall thickening and surrounding haziness/inflammation. Differential considerations would include enteritis (infectious, inflammatory or ischemic). There appears to be a stricture/area of narrowing with resulting small bowel obstruction with proximal loops fluid-filled with scattered air-fluid levels. While this could be related to enteritis and stricture or adhesions, cannot exclude internal hernia. Recommend surgical consultation. Liver measures enlarged at 20 cm in could reflect hepatomegaly or Riedel's lobe. Large hiatal hernia. These results were called by telephone at the time of interpretation on 11/26/2023 at 7:59 pm to provider KEVIN PADUCHOWSKI , who verbally acknowledged these results. Electronically Signed   By: Franky Crease M.D.   On: 11/26/2023 19:59    Scheduled Meds:  busPIRone   10 mg Oral BID   DULoxetine   60 mg Oral QHS   fluticasone  furoate-vilanterol  1 puff Inhalation Daily   gabapentin   300 mg Oral BID   insulin  aspart  0-5 Units Subcutaneous QHS   insulin  aspart  0-9 Units Subcutaneous TID WC   montelukast   10 mg Oral QHS   pantoprazole  (PROTONIX ) IV  40 mg Intravenous Q24H    rOPINIRole   1 mg Oral Daily   traZODone   150 mg Oral QHS   Continuous Infusions:   ceFAZolin  (ANCEF ) IV Stopped (11/28/23 1445)   lactated ringers  75 mL/hr at 11/28/23 1600   metronidazole  Stopped (11/28/23 1149)     LOS: 2 days  MDM: Patient is high risk for one or more organ failure.  They necessitate ongoing hospitalization for continued IV therapies and subsequent lab monitoring. Total time spent interpreting labs and vitals, reviewing the medical record, coordinating care amongst consultants and care team members, directly assessing and discussing care with the patient and/or family: 55 min Laree Lock, MD [facility]  To contact the attending physician between 7A-7P please use Epic Chat. To contact the covering physician during after hours 7P-7A, please review Amion.  11/28/2023, 5:14  PM   *This document has been created with the assistance of dictation software. Please excuse typographical errors. *

## 2023-11-29 ENCOUNTER — Encounter: Payer: Self-pay | Admitting: Surgery

## 2023-11-29 DIAGNOSIS — K56609 Unspecified intestinal obstruction, unspecified as to partial versus complete obstruction: Secondary | ICD-10-CM | POA: Diagnosis not present

## 2023-11-29 LAB — CBC
HCT: 24.4 % — ABNORMAL LOW (ref 36.0–46.0)
Hemoglobin: 7.7 g/dL — ABNORMAL LOW (ref 12.0–15.0)
MCH: 25.8 pg — ABNORMAL LOW (ref 26.0–34.0)
MCHC: 31.6 g/dL (ref 30.0–36.0)
MCV: 81.9 fL (ref 80.0–100.0)
Platelets: 435 K/uL — ABNORMAL HIGH (ref 150–400)
RBC: 2.98 MIL/uL — ABNORMAL LOW (ref 3.87–5.11)
RDW: 15.9 % — ABNORMAL HIGH (ref 11.5–15.5)
WBC: 10.4 K/uL (ref 4.0–10.5)
nRBC: 0 % (ref 0.0–0.2)

## 2023-11-29 LAB — GLUCOSE, CAPILLARY
Glucose-Capillary: 106 mg/dL — ABNORMAL HIGH (ref 70–99)
Glucose-Capillary: 126 mg/dL — ABNORMAL HIGH (ref 70–99)
Glucose-Capillary: 88 mg/dL (ref 70–99)
Glucose-Capillary: 103 mg/dL — ABNORMAL HIGH (ref 70–99)

## 2023-11-29 LAB — HEMOGLOBIN A1C
Hgb A1c MFr Bld: 6.1 % — ABNORMAL HIGH (ref 4.8–5.6)
Hgb A1c MFr Bld: 6.2 % — ABNORMAL HIGH (ref 4.8–5.6)
Mean Plasma Glucose: 128 mg/dL
Mean Plasma Glucose: 131 mg/dL

## 2023-11-29 LAB — BASIC METABOLIC PANEL WITH GFR
Anion gap: 9 (ref 5–15)
BUN: 6 mg/dL — ABNORMAL LOW (ref 8–23)
CO2: 28 mmol/L (ref 22–32)
Calcium: 7.7 mg/dL — ABNORMAL LOW (ref 8.9–10.3)
Chloride: 103 mmol/L (ref 98–111)
Creatinine, Ser: 0.58 mg/dL (ref 0.44–1.00)
GFR, Estimated: >60 mL/min (ref >60)
Glucose, Bld: 93 mg/dL (ref 70–99)
Potassium: 3.4 mmol/L — ABNORMAL LOW (ref 3.5–5.1)
Sodium: 140 mmol/L (ref 135–145)

## 2023-11-29 LAB — MAGNESIUM: Magnesium: 1.7 mg/dL (ref 1.7–2.4)

## 2023-11-29 MED ORDER — PANTOPRAZOLE SODIUM 40 MG PO TBEC
40.0000 mg | DELAYED_RELEASE_TABLET | ORAL | Status: DC
  Administered 2023-11-29: 40 mg via ORAL
  Filled 2023-11-29: qty 1

## 2023-11-29 MED ORDER — MAGNESIUM SULFATE 2 GM/50ML IV SOLN
2.0000 g | Freq: Once | INTRAVENOUS | Status: AC
  Administered 2023-11-29: 2 g via INTRAVENOUS
  Filled 2023-11-29: qty 50

## 2023-11-29 MED ORDER — DIPHENHYDRAMINE HCL 12.5 MG/5ML PO ELIX: 12.5000 mg | ORAL_SOLUTION | Freq: Four times a day (QID) | ORAL | Status: DC | PRN

## 2023-11-29 MED ORDER — POTASSIUM CHLORIDE CRYS ER 20 MEQ PO TBCR
40.0000 meq | EXTENDED_RELEASE_TABLET | Freq: Once | ORAL | Status: AC
  Administered 2023-11-29: 40 meq via ORAL
  Filled 2023-11-29: qty 2

## 2023-11-29 NOTE — Care Management Important Message (Signed)
 Important Message  Patient Details  Name: Summer Murphy MRN: [id number] Date of Birth: 1955-07-07   Important Message Given:  Yes - Medicare IM     Lynton Crescenzo W, CMA 11/29/2023, 12:06 PM

## 2023-11-29 NOTE — Progress Notes (Signed)
 PROGRESS NOTE    Summer Murphy  FMW:[id number] DOB: 08/21/1955 DOA: 11/26/2023 PCP: Liana Fish, NP  Chief Complaint  Patient presents with   Abdominal Pain    Hospital Course:  Summer Murphy is a 68 y.o. female with medical history significant of GERD, Chronic pain syndrome, COPD, essential hypertension, multiple abdominal surgeries, anxiety disorder, asthma, type 2 diabetes, cervical spine myelopathy, migraine headaches, history of left bundle branch block who presents to the ER with abdominal pain, vomiting. Admitted for partial SBO, seen by general surgery.  Underwent exploratory laparotomy for lysis of adhesions on 09/06.  Hospital course as below  Subjective: Was examined at the bedside, she feels much better today.  Abdominal pain has improved, doing well on full liquid diet.  Denies any new complaints today    Objective: Vitals:   11/28/23 1521 11/28/23 2046 11/29/23 0506 11/29/23 0943  BP: 132/82 117/69 (!) 98/45 (!) 129/40  Pulse: (!) 108 96 94 100  Resp: 17 18 16 18   Temp: 98.6 F (37 C) 98.7 F (37.1 C) 98.4 F (36.9 C) 98.5 F (36.9 C)  TempSrc:  Oral Oral Oral  SpO2: 100% 99% 97% 100%  Height:        Intake/Output Summary (Last 24 hours) at 11/29/2023 1547 Last data filed at 11/29/2023 1300 Gross per 24 hour  Intake 1054 ml  Output --  Net 1054 ml   There were no vitals filed for this visit.  Examination: Constitutional: NAD, calm, comfortable Neck: normal, supple, no masses, no thyromegaly Respiratory: clear to auscultation bilaterally, no wheezing, no crackles. Normal respiratory effort. No accessory muscle use.  Cardiovascular: RRR, no murmurs / rubs / gallops. No extremity edema. 2+ pedal pulses.  Abdomen: Soft, mild tenderness expected, nondistended, dressing in place with minimal bloody staining.  No BS Skin: no rashes, lesions, ulcers. No induration Neurologic: CN 2-12 grossly intact. Sensation intact, DTR normal. Strength 5/5 in all 4.    Assessment & Plan:  Principal Problem:   Small bowel obstruction (HCC) Active Problems:   T2DM (type 2 diabetes mellitus) (HCC)   Essential hypertension   Anxiety and depression   Acid reflux   COPD (chronic obstructive pulmonary disease) (HCC)   Diaphragmatic hernia   Polyneuropathy associated with underlying disease (HCC)   Chronic asthmatic bronchitis (HCC)   Hypokalemia   Hyponatremia   Internal hernia  Small bowel obstruction, internal hernia S/p exploratory laparotomy and lysis of adhesions - Lactic acid not elevated, leukocytosis resolved - CT shows abnormal distal small bowel with wall thickening and surrounding haziness/inflammation.  Stricture/area of narrowing with resulting SBO with proximal loops fluid-filled with scattered air-fluid levels - Seen by general surgery, appreciate recs  - S/p exploratory laparotomy lysis of band adhesions causing internal hernia 09/06 - Advance to FLD, ADAT, gentle IV fluids - Eliquis  on hold, resume when ok per Surgery - Pain management (oxycodone  changed to Percocet), antiemetics as needed  Leukocytosis - resolved - discontinue antibiotics  Hypokalemia Hypomagnesemia - Monitor and replete as needed  Normocytic anemia - Slight drop in hemoglobin, stable - Transfuse if Hb < 7, no evidence of bleeding except bloody stain around the incision site  Type II DM - Not on meds at home, HbA1c pending - sliding scale insulin .  Continue to monitor.  GERD:  Continues PPIs   COPD: No acute exacerbation - Continue home inhalers   Generalized anxiety - Continue buspirone , duloxetine    Polyneuropathy - Continue gabapentin   -- PT/OT rec Home PT/OT  DVT  prophylaxis: SCD   Code Status: Full Code Disposition: Home with home services  Consultants:  Treatment Team:  Consulting Physician: Lane Shope, MD  Procedures:  exploratory laparotomy lysis of adhesions  Antimicrobials:  Anti-infectives (From admission, onward)     Start     Dose/Rate Route Frequency Ordered Stop   11/27/23 1200  ceFAZolin  (ANCEF ) IVPB 2g/100 mL premix  Status:  Discontinued        2 g 200 mL/hr over 30 Minutes Intravenous Every 8 hours 11/26/23 2344 11/29/23 0905   11/27/23 1000  metroNIDAZOLE  (FLAGYL ) IVPB 500 mg  Status:  Discontinued        500 mg 100 mL/hr over 60 Minutes Intravenous Every 12 hours 11/26/23 2344 11/29/23 0905   11/27/23 0045  metroNIDAZOLE  (FLAGYL ) IVPB 500 mg        500 mg 100 mL/hr over 60 Minutes Intravenous  Once 11/26/23 2353 11/27/23 0129   11/27/23 0045  ceFAZolin  (ANCEF ) IVPB 2g/100 mL premix        2 g 200 mL/hr over 30 Minutes Intravenous  Once 11/26/23 2353 11/27/23 0126       Data Reviewed: I have personally reviewed following labs and imaging studies CBC: Recent Labs  Lab 11/26/23 1739 11/27/23 0116 11/28/23 0649 11/29/23 0415  WBC 11.7* 9.7 13.9* 10.4  HGB 9.2* 8.1* 7.4* 7.7*  HCT 29.0* 25.5* 23.7* 24.4*  MCV 80.8 81.2 81.2 81.9  PLT 567* 465* 461* 435*   Basic Metabolic Panel: Recent Labs  Lab 11/26/23 1739 11/27/23 0116 11/27/23 2226 11/28/23 0649 11/29/23 0415  NA 134* 135  --  139 140  K 3.1* 3.1* 3.7 3.9 3.4*  CL 94* 98  --  100 103  CO2 26 24  --  27 28  GLUCOSE 145* 122*  --  94 93  BUN 11 8  --  12 6*  CREATININE 0.62 0.43*  --  0.39* 0.58  CALCIUM 8.5* 7.8*  --  8.0* 7.7*  MG  --  1.5* 2.2 1.9 1.7   GFR: Estimated Creatinine Clearance: 49.7 mL/min (by C-G formula based on SCr of 0.58 mg/dL). Liver Function Tests: Recent Labs  Lab 11/26/23 1739 11/27/23 0116  AST 23 15  ALT 17 16  ALKPHOS 74 63  BILITOT 0.6 0.5  PROT 6.9 5.9*  ALBUMIN 2.7* 2.5*   CBG: Recent Labs  Lab 11/28/23 1214 11/28/23 1725 11/28/23 2104 11/29/23 0738 11/29/23 1225  GLUCAP 113* 115* 113* 106* 126*    Recent Results (from the past 240 hours)  MRSA Next Gen by PCR, Nasal     Status: None   Collection Time: 11/27/23  1:08 AM   Specimen: Nasal Mucosa; Nasal Swab   Result Value Ref Range Status   MRSA by PCR Next Gen NOT DETECTED NOT DETECTED Final    Comment: (NOTE) The GeneXpert MRSA Assay (FDA approved for NASAL specimens only), is one component of a comprehensive MRSA colonization surveillance program. It is not intended to diagnose MRSA infection nor to guide or monitor treatment for MRSA infections. Test performance is not FDA approved in patients less than 65 years old. Performed at [facility], [address], [place], [place] [address]      Radiology Studies: No results found.   Scheduled Meds:  busPIRone   10 mg Oral BID   DULoxetine   60 mg Oral QHS   fluticasone  furoate-vilanterol  1 puff Inhalation Daily   gabapentin   300 mg Oral BID   insulin  aspart  0-5 Units  Subcutaneous QHS   insulin  aspart  0-9 Units Subcutaneous TID WC   montelukast   10 mg Oral QHS   pantoprazole   40 mg Oral Q24H   rOPINIRole   1 mg Oral Daily   traZODone   150 mg Oral QHS   Continuous Infusions:  lactated ringers  50 mL/hr at 11/29/23 0914     LOS: 3 days  MDM: Patient is high risk for one or more organ failure.  They necessitate ongoing hospitalization for continued IV therapies and subsequent lab monitoring. Total time spent interpreting labs and vitals, reviewing the medical record, coordinating care amongst consultants and care team members, directly assessing and discussing care with the patient and/or family: 55 min Laree Lock, MD [facility]  To contact the attending physician between 7A-7P please use Epic Chat. To contact the covering physician during after hours 7P-7A, please review Amion.  11/29/2023, 3:47 PM   *This document has been created with the assistance of dictation software. Please excuse typographical errors. *

## 2023-11-29 NOTE — Plan of Care (Signed)

## 2023-11-29 NOTE — Evaluation (Signed)
 Occupational Therapy Evaluation Patient Details Name: Summer Murphy MRN: [id number] DOB: March 06, 1956 Today's Date: 11/29/2023   History of Present Illness   Pt is a 68 y.o. female admitted with SBO 2 Days Post-Op s/p exploratory laparotomy and LOA for internal hernia. Significant PMH includes: type 2 diabetes, GERD, chronic pain, COPD, HTN, anxiety, mult abdominal surgeries, asthma, arthritis s/p cervical fusion and drainage of bilateral psoas     Clinical Impressions Pt was seen for OT evaluation this date. PTA, pt reports she lived in an elevator accessible apartment and is MOD I with use of RW for ambulation and MOD I for ADL performance. She reports she has a PCA MWF from 1pm-3pm who assists with household chores and any tasks needed. Pt reports recent L knee issues with tendinitis and attending 1 outpatient PT session at [facility] prior to this admission.  Pt presents with deficits in strength and pain, affecting safe and optimal ADL completion. Pt currently requires supervision to stand from recliner to RW and CGA/SBA to ambulate to the sink to perform standing grooming tasks with SBA. Pt is able to take BUEs off RW and perform grooming tasks at times without LOB. She reports moderate abdominal soreness. Edu on abdominal precautions for comfort and protection and verbalized understanding. She required supervision for LB dressing to donn/doff socks seated in recliner. Breakfast arrived and pt declined further activity at this time d/t wanting to eat. Pt would benefit from skilled OT services to address noted impairments and functional limitations to maximize safety and independence while minimizing future risk of falls, injury, and readmission. Do anticipate the need for follow up OT services upon acute hospital DC.      If plan is discharge home, recommend the following:   A little help with walking and/or transfers;A little help with bathing/dressing/bathroom;Assistance with  cooking/housework;Assist for transportation;Help with stairs or ramp for entrance     Functional Status Assessment   Patient has had a recent decline in their functional status and demonstrates the ability to make significant improvements in function in a reasonable and predictable amount of time.     Equipment Recommendations   None recommended by OT (has needed DME)     Recommendations for Other Services         Precautions/Restrictions   Precautions Precautions: Fall Recall of Precautions/Restrictions: Intact     Mobility Bed Mobility               General bed mobility comments: NT up in recliner pre/post session    Transfers Overall transfer level: Needs assistance Equipment used: Rolling walker (2 wheels) Transfers: Sit to/from Stand Sit to Stand: Supervision           General transfer comment: able to stand with general ease from recliner to RW and ambulated to the sink and back using RW with CGA/SBA      Balance Overall balance assessment: Needs assistance   Sitting balance-Leahy Scale: Good       Standing balance-Leahy Scale: Good Standing balance comment: RW, SBA                           ADL either performed or assessed with clinical judgement   ADL Overall ADL's : Needs assistance/impaired     Grooming: Wash/dry face;Oral care;Wash/dry hands;Standing;Supervision/safety;Contact guard assist               Lower Body Dressing: Supervision/safety;Sitting/lateral leans Lower Body Dressing Details (indicate cue type and  reason): able to bring feet towards chest to doff/don socks seated in recliner             Functional mobility during ADLs: Contact guard assist;Cueing for safety;Rolling walker (2 wheels)       Vision         Perception         Praxis         Pertinent Vitals/Pain Pain Assessment Pain Assessment: 0-10 Pain Score: 7  Pain Location: abdomen Pain Descriptors / Indicators: Sore Pain  Intervention(s): Monitored during session, Repositioned, Limited activity within patient's tolerance     Extremity/Trunk Assessment Upper Extremity Assessment Upper Extremity Assessment: Overall WFL for tasks assessed   Lower Extremity Assessment Lower Extremity Assessment: Generalized weakness       Communication Communication Communication: No apparent difficulties   Cognition Arousal: Alert Behavior During Therapy: WFL for tasks assessed/performed               OT - Cognition Comments: oriented to person, place, month, year                 Following commands: Intact       Cueing  General Comments   Cueing Techniques: Verbal cues  general soreness to abdomen   Exercises Other Exercises Other Exercises: Edu on role of OT and DME needs as well as DC recommendations.   Shoulder Instructions      Home Living Family/patient expects to be discharged to:: Private residence Living Arrangements: Alone Available Help at Discharge: Friend(s);Available PRN/intermittently;Neighbor;Personal care attendant (comes on MWF 1pm-3pm) Type of Home: Apartment Home Access: Elevator     Home Layout: One level     Bathroom Shower/Tub: Producer, television/film/video: Standard     Home Equipment: Shower seat - built in;Grab bars - tub/shower;Rollator (4 wheels);Standard Walker;Cane - single point;Adaptive equipment Adaptive Equipment: Reacher        Prior Functioning/Environment Prior Level of Function : History of Falls (last six months);Independent/Modified Independent             Mobility Comments: Mod I using RW; has L knee tendinitis and went to one outpatient appt at [facility] prior to coming in; denies falls ADLs Comments: IND with ADLs, has a PCA MWF 1pm-3pm with household chores and whatever she needs; assistance for transportation from cousin    OT Problem List: Decreased strength;Decreased activity tolerance;Pain   OT Treatment/Interventions:  Self-care/ADL training;Therapeutic exercise;Patient/family education;Balance training;Therapeutic activities;DME and/or AE instruction;Energy conservation      OT Goals(Current goals can be found in the care plan section)   Acute Rehab OT Goals Patient Stated Goal: return home OT Goal Formulation: With patient Time For Goal Achievement: 12/13/23 Potential to Achieve Goals: Good ADL Goals Pt Will Perform Lower Body Bathing: with modified independence;with supervision;sit to/from stand;sitting/lateral leans Pt Will Perform Lower Body Dressing: with modified independence;with supervision;sitting/lateral leans;sit to/from stand;with adaptive equipment Pt Will Transfer to Toilet: with modified independence;ambulating Additional ADL Goal #1: Pt will demo 1 learned ECS and abdominal precautions during ADL performance to maximize safety and IND to return home.   OT Frequency:  Min 2X/week    Co-evaluation              AM-PAC OT 6 Clicks Daily Activity     Outcome Measure Help from another person eating meals?: None Help from another person taking care of personal grooming?: None Help from another person toileting, which includes using toliet, bedpan, or urinal?: A Little Help from another  person bathing (including washing, rinsing, drying)?: A Little Help from another person to put on and taking off regular upper body clothing?: None Help from another person to put on and taking off regular lower body clothing?: A Little 6 Click Score: 21   End of Session Equipment Utilized During Treatment: Gait belt;Rolling walker (2 wheels) Nurse Communication: Mobility status  Activity Tolerance: Patient tolerated treatment well Patient left: in chair;with call bell/phone within reach;with chair alarm set  OT Visit Diagnosis: Other abnormalities of gait and mobility (R26.89);Muscle weakness (generalized) (M62.81);Pain                Time: 9073-9054 OT Time Calculation (min): 19  min Charges:  OT General Charges $OT Visit: 1 Visit OT Evaluation $OT Eval Low Complexity: 1 Low  Uthman Mroczkowski, OTR/L 11/29/23, 11:36 AM  Meily Glowacki E Zarielle Cea 11/29/2023, 11:32 AM

## 2023-11-29 NOTE — Progress Notes (Signed)
 [facility] SURGICAL PROGRESS NOTE  Hospital Day(s): 3.   Post op day(s): 2 Days Post-Op.   Interval History:  Patient seen and examined No acute events or new complaints overnight.  Patient reports she is doing better Abdomen is sore expectedly No fever, chills, nausea, emesis Leukocytosis normalized; WBC 10.4K Hgb to 7.7; stable  Renal function normal; sCr - 0.58; UO - 300 ccs + unmeasured amounts  Hypokalemia to 3.4 She is on CLD; no issues She is having bowel function   Vital signs in last 24 hours: [min-max] current  Temp:  [98.4 F (36.9 C)-98.7 F (37.1 C)] 98.4 F (36.9 C) (09/08 0506) Pulse Rate:  [94-108] 94 (09/08 0506) Resp:  [16-18] 16 (09/08 0506) BP: (98-132)/(45-82) 98/45 (09/08 0506) SpO2:  [97 %-100 %] 97 % (09/08 0506)     Height: 4' 11 (149.9 cm)       Intake/Output last 2 shifts:  09/07 0701 - 09/08 0700 In: 693.9 [I.V.:394; IV Piggyback:299.9] Out: 300 [Urine:300]   Physical Exam:  Constitutional: alert, cooperative and no distress  Respiratory: breathing non-labored at rest  Cardiovascular: regular rate and sinus rhythm  Gastrointestinal: soft, expected incisional soreness, and non-distended. No rebound/guarding.  Integumentary: Honeycomb removed, Laparotomy is CDI with staples, no erythema  Labs:     Latest Ref Rng & Units 11/29/2023    4:15 AM 11/28/2023    6:49 AM 11/27/2023    1:16 AM  CBC  WBC 4.0 - 10.5 K/uL 10.4  13.9  9.7   Hemoglobin 12.0 - 15.0 g/dL 7.7  7.4  8.1   Hematocrit 36.0 - 46.0 % 24.4  23.7  25.5   Platelets 150 - 400 K/uL 435  461  465       Latest Ref Rng & Units 11/29/2023    4:15 AM 11/28/2023    6:49 AM 11/27/2023   10:26 PM  CMP  Glucose 70 - 99 mg/dL 93  94    BUN 8 - 23 mg/dL 6  12    Creatinine 9.55 - 1.00 mg/dL 9.41  9.60    Sodium 864 - 145 mmol/L 140  139    Potassium 3.5 - 5.1 mmol/L 3.4  3.9  3.7   Chloride 98 - 111 mmol/L 103  100    CO2 22 - 32 mmol/L 28  27    Calcium 8.9 - 10.3 mg/dL  7.7  8.0       Imaging studies: No new pertinent imaging studies   Assessment/Plan:  67 y.o. female 2 Days Post-Op s/p exploratory laparotomy and LOA for internal hernia   - Okay to start advancing diet; FLD this morning. Can do soft diet this afternoon if she does well, ADAT orders written  - I think we can consider stopping Abx at this time, WBC normalized, having bowel function   - Monitor abdominal examination; on-going bowel function  - Pain control prn; antiemetics prn  - Mobilize; okay to work with therapies   - Further management per primary service; we will follow     - Discharge Planning: Now with return of bowel function, diet advancing. Depending on continued clinical progress, may be able to DC home in next 24-48 hours.    All of the above findings and recommendations were discussed with the patient, and the medical team, and all of patient's questions were answered to her expressed satisfaction.  -- Emelio Schneller, PA-C [facility] 11/29/2023, 8:30 AM M-F: 7am - 4pm

## 2023-11-29 NOTE — Anesthesia Postprocedure Evaluation (Signed)
 Anesthesia Post Note  Patient: Summer Murphy  Procedure(s) Performed: LAPAROTOMY, EXPLORATORY lysis of adhesions  Patient location during evaluation: PACU Anesthesia Type: General Level of consciousness: awake and alert Pain management: pain level controlled Vital Signs Assessment: post-procedure vital signs reviewed and stable Respiratory status: spontaneous breathing, nonlabored ventilation, respiratory function stable and patient connected to nasal cannula oxygen  Cardiovascular status: blood pressure returned to baseline and stable Postop Assessment: no apparent nausea or vomiting Anesthetic complications: no   No notable events documented.   Last Vitals:  Vitals:   11/28/23 2046 11/29/23 0506  BP: 117/69 (!) 98/45  Pulse: 96 94  Resp: 18 16  Temp: 37.1 C 36.9 C  SpO2: 99% 97%    Last Pain:  Vitals:   11/29/23 0553  TempSrc:   PainSc: 2                  Debby Mines

## 2023-11-29 NOTE — Progress Notes (Signed)
 PHARMACIST - PHYSICIAN COMMUNICATION  DR:   Jerelene  CONCERNING: IV to Oral Route Change Policy  RECOMMENDATION: The patient is receiving Protonix  by the intravenous route.  Based on criteria approved by the Pharmacy and Therapeutics Committee and the Medical Executive Committee, the medication is being converted to the equivalent oral dose form.   DESCRIPTION: These criteria include: The patient is eating (either orally or via tube) and/or has been taking other orally administered medications for a least 24 hours The patient has no evidence of active gastrointestinal bleeding or impaired GI absorption (gastrectomy, short bowel, patient on TNA or NPO).  If you have questions about this conversion, please contact the Pharmacy Department  []   [phone] )  [facility] [x]   [phone] )  [facility] []   [phone] )  [facility] []   [phone] )  [facility] []   [phone] )  [facility] 11/29/2023 10:14 AM

## 2023-11-29 NOTE — Evaluation (Signed)
 Physical Therapy Evaluation Patient Details Name: Summer Murphy MRN: [id number] DOB: Jul 18, 1955 Today's Date: 11/29/2023  History of Present Illness  Pt is a 68 y.o. female admitted with SBO 2 Days Post-Op s/p exploratory laparotomy and LOA for internal hernia. Significant PMH includes: type 2 diabetes, GERD, chronic pain, COPD, HTN, anxiety, mult abdominal surgeries, asthma, arthritis s/p cervical fusion and drainage of bilateral psoas  Clinical Impression  Patient admitted with the above. PTA, patient lives alone and was modI with RW. She has a PCA MWF 1-3pm to assist with household chores and other tasks needed. Had been to 1 outpatient PT visit for L knee pain. Patient presents with weakness, impaired balance, and decreased activity tolerance. Limited by abdominal incision pain. Ambulated ~75' with RW and supervision with forward flexed trunk and intermittent follow through with cues for upright posture. Educated patient on lying flat to prevent abdominal incision contracture, patient reluctant to accept education. Patient will benefit from skilled PT services during acute stay to address listed deficits. Patient will benefit from ongoing therapy at discharge to maximize functional independence and safety.      If plan is discharge home, recommend the following: A little help with bathing/dressing/bathroom;Assistance with cooking/housework;Help with stairs or ramp for entrance;Assist for transportation   Can travel by private vehicle        Equipment Recommendations None recommended by PT  Recommendations for Other Services       Functional Status Assessment Patient has had a recent decline in their functional status and demonstrates the ability to make significant improvements in function in a reasonable and predictable amount of time.     Precautions / Restrictions Precautions Precautions: Fall Recall of Precautions/Restrictions: Intact Restrictions Weight Bearing Restrictions  Per Provider Order: No      Mobility  Bed Mobility Overal bed mobility: Needs Assistance Bed Mobility: Sit to Supine       Sit to supine: Supervision   General bed mobility comments: seated on EOB with mobility specialist on arrival    Transfers Overall transfer level: Needs assistance Equipment used: Rolling Other Atienza (2 wheels) Transfers: Sit to/from Stand Sit to Stand: Supervision                Ambulation/Gait Ambulation/Gait assistance: Supervision Gait Distance (Feet): 75 Feet Assistive device: Rolling Adison Reifsteck (2 wheels) Gait Pattern/deviations: Step-through pattern, Decreased stride length, Trunk flexed Gait velocity: decreased     General Gait Details: supervision for safety. Trunk flexed throughout 2/2 pain. Cues for upright posture and relax shoulders as patient maintains shoulder shrug throughout  Stairs            Wheelchair Mobility     Tilt Bed    Modified Rankin (Stroke Patients Only)       Balance Overall balance assessment: Needs assistance Sitting-balance support: Feet supported, No upper extremity supported Sitting balance-Leahy Scale: Good     Standing balance support: Bilateral upper extremity supported, Reliant on assistive device for balance Standing balance-Leahy Scale: Good                               Pertinent Vitals/Pain Pain Assessment Pain Assessment: Faces Faces Pain Scale: Hurts whole lot Pain Location: abdomen Pain Descriptors / Indicators: Sore Pain Intervention(s): Monitored during session, Repositioned    Home Living Family/patient expects to be discharged to:: Private residence Living Arrangements: Alone Available Help at Discharge: Friend(s);Available PRN/intermittently;Neighbor;Personal care attendant (comes on MWF 1pm-3pm) Type of Home: Apartment  Home Access: Elevator       Home Layout: One level Home Equipment: Shower seat - built in;Grab bars - tub/shower;Rollator (4 wheels);Cane -  single point;Adaptive equipment;Rolling Elveta Rape (2 wheels)      Prior Function Prior Level of Function : History of Falls (last six months);Independent/Modified Independent             Mobility Comments: Mod I using RW; has L knee tendinitis and went to one outpatient appt at [facility] prior to coming in; denies falls ADLs Comments: IND with ADLs, has a PCA MWF 1pm-3pm with household chores and whatever she needs; assistance for transportation from cousin     Extremity/Trunk Assessment   Upper Extremity Assessment Upper Extremity Assessment: Defer to OT evaluation    Lower Extremity Assessment Lower Extremity Assessment: Generalized weakness    Cervical / Trunk Assessment Cervical / Trunk Assessment: Normal  Communication   Communication Communication: No apparent difficulties    Cognition Arousal: Alert Behavior During Therapy: WFL for tasks assessed/performed   PT - Cognitive impairments: No apparent impairments                         Following commands: Intact       Cueing       General Comments General comments (skin integrity, edema, etc.): general soreness to abdomen    Exercises Other Exercises Other Exercises: Educated patient on importance of lying flat to prevent incision contracture in flexed posture   Assessment/Plan    PT Assessment Patient needs continued PT services  PT Problem List Decreased strength;Decreased activity tolerance;Decreased balance;Decreased mobility;Decreased knowledge of precautions;Decreased safety awareness       PT Treatment Interventions DME instruction;Gait training;Functional mobility training;Therapeutic activities;Therapeutic exercise;Balance training;Neuromuscular re-education;Patient/family education    PT Goals (Current goals can be found in the Care Plan section)  Acute Rehab PT Goals Patient Stated Goal: to go home PT Goal Formulation: With patient Time For Goal Achievement: 12/13/23 Potential to  Achieve Goals: Good    Frequency Min 2X/week     Co-evaluation               AM-PAC PT 6 Clicks Mobility  Outcome Measure Help needed turning from your back to your side while in a flat bed without using bedrails?: A Little Help needed moving from lying on your back to sitting on the side of a flat bed without using bedrails?: A Little Help needed moving to and from a bed to a chair (including a wheelchair)?: A Little Help needed standing up from a chair using your arms (e.g., wheelchair or bedside chair)?: A Little Help needed to walk in hospital room?: A Little Help needed climbing 3-5 steps with a railing? : A Little 6 Click Score: 18    End of Session   Activity Tolerance: Patient tolerated treatment well;Patient limited by pain Patient left: in bed;with call bell/phone within reach;with bed alarm set Nurse Communication: Mobility status PT Visit Diagnosis: Unsteadiness on feet (R26.81);Muscle weakness (generalized) (M62.81)    Time: 8577-8564 PT Time Calculation (min) (ACUTE ONLY): 13 min   Charges:   PT Evaluation $PT Eval Moderate Complexity: 1 Mod   PT General Charges $$ ACUTE PT VISIT: 1 Visit         Maryanne Finder, PT, DPT Physical Therapist - [facility]  [facility]   Iden Stripling A Jasaiah Karwowski 11/29/2023, 3:08 PM

## 2023-11-30 ENCOUNTER — Inpatient Hospital Stay

## 2023-11-30 ENCOUNTER — Inpatient Hospital Stay: Admitting: Oncology

## 2023-11-30 DIAGNOSIS — K56609 Unspecified intestinal obstruction, unspecified as to partial versus complete obstruction: Secondary | ICD-10-CM | POA: Diagnosis not present

## 2023-11-30 LAB — BASIC METABOLIC PANEL WITH GFR
Anion gap: 8 (ref 5–15)
BUN: <5 mg/dL — ABNORMAL LOW (ref 8–23)
CO2: 29 mmol/L (ref 22–32)
Calcium: 7.9 mg/dL — ABNORMAL LOW (ref 8.9–10.3)
Chloride: 103 mmol/L (ref 98–111)
Creatinine, Ser: 0.38 mg/dL — ABNORMAL LOW (ref 0.44–1.00)
GFR, Estimated: >60 mL/min (ref >60)
Glucose, Bld: 100 mg/dL — ABNORMAL HIGH (ref 70–99)
Potassium: 3.5 mmol/L (ref 3.5–5.1)
Sodium: 140 mmol/L (ref 135–145)

## 2023-11-30 LAB — GLUCOSE, CAPILLARY
Glucose-Capillary: 120 mg/dL — ABNORMAL HIGH (ref 70–99)
Glucose-Capillary: 148 mg/dL — ABNORMAL HIGH (ref 70–99)

## 2023-11-30 LAB — HEMOGLOBIN: Hemoglobin: 9.2 g/dL — ABNORMAL LOW (ref 12.0–15.0)

## 2023-11-30 LAB — MAGNESIUM: Magnesium: 1.9 mg/dL (ref 1.7–2.4)

## 2023-11-30 MED ORDER — POTASSIUM CHLORIDE CRYS ER 20 MEQ PO TBCR
40.0000 meq | EXTENDED_RELEASE_TABLET | Freq: Once | ORAL | Status: AC
  Administered 2023-11-30: 40 meq via ORAL
  Filled 2023-11-30: qty 2

## 2023-11-30 MED ORDER — APIXABAN 5 MG PO TABS
5.0000 mg | ORAL_TABLET | Freq: Two times a day (BID) | ORAL | Status: DC
  Administered 2023-11-30: 5 mg via ORAL
  Filled 2023-11-30: qty 1

## 2023-11-30 NOTE — Discharge Summary (Signed)
 Physician Discharge Summary   Patient: Summer Murphy MRN: [id number] DOB: 02/08/1956  Admit date:     11/26/2023  Discharge date: 11/30/23  Discharge Physician: Laree Lock   PCP: Liana Fish, NP   Recommendations at discharge:   Follow-up with PCP within 1 week -repeat BMP, mag, CBC  Follow-up with surgery in 7 to 10 days  Home PT/OT set up  Discharge Diagnoses: Principal Problem:   Small bowel obstruction (HCC) Active Problems:   T2DM (type 2 diabetes mellitus) (HCC)   Essential hypertension   Anxiety and depression   Acid reflux   COPD (chronic obstructive pulmonary disease) (HCC)   Diaphragmatic hernia   Polyneuropathy associated with underlying disease (HCC)   Chronic asthmatic bronchitis (HCC)   Hypokalemia   Hyponatremia   Internal hernia  Hospital Course: Summer Murphy is a 68 y.o. female with medical history significant of GERD, Chronic pain syndrome, COPD, essential hypertension, multiple abdominal surgeries, anxiety disorder, asthma, type 2 diabetes, cervical spine myelopathy, migraine headaches, history of left bundle branch block who presents to the ER with abdominal pain, vomiting. Admitted for SBO, seen by general surgery.  Underwent exploratory laparotomy for lysis of adhesions on 09/06.  Hospital course as below   Small bowel obstruction, internal hernia S/p exploratory laparotomy and lysis of adhesions - CT shows abnormal distal small bowel with wall thickening and surrounding haziness/inflammation.  Stricture/area of narrowing with resulting SBO with proximal loops fluid-filled with scattered air-fluid levels - S/p exploratory laparotomy lysis of band adhesions causing internal hernia 09/06 - Tolerating diet - Pain management (on Tylenol , Norco at home), antiemetics as needed - Follow-up with surgery outpatient in 7 to 10 days  Nonocclusive thrombus in IVC  - Resume Eliquis  09/09   Leukocytosis - resolved - discontinue antibiotics    Hypokalemia - repleted Hypomagnesemia - On supplements at home   Normocytic anemia - Hb stable    -- PT/OT rec Home PT/OT set up    Pain control - [place]  Controlled Substance Reporting System database was reviewed. and patient was instructed, not to drive, operate heavy machinery, perform activities at heights, swimming or participation in water  activities or provide baby-sitting services while on Pain, Sleep and Anxiety Medications; until their outpatient Physician has advised to do so again. Also recommended to not to take more than prescribed Pain, Sleep and Anxiety Medications.  Consultants: General surgery Procedures performed: exploratory laparotomy lysis of adhesions  Disposition: Home health Diet recommendation:  Discharge Diet Orders (From admission, onward)     Start     Ordered   11/30/23 0000  Diet - low sodium heart healthy        11/30/23 1616            DISCHARGE MEDICATION: Allergies as of 11/30/2023       Reactions   Librium [chlordiazepoxide] Shortness Of Breath   Reglan [metoclopramide] Hives, Other (See Comments)   hallucinations   Vanilla Shortness Of Breath   Pt reports allergy to vanilla extract only   Aspirin Hives   Nsaids Rash   Rash/flares asthma issues.   Azithromycin  Other (See Comments)   Unsure of what the reaction was.   Buprenorphine Hcl Other (See Comments)   Unsure of what the reaction was.   Flexeril  [cyclobenzaprine ]    hallucinations   Morphine Other (See Comments)   Unsure of reaction   Sulfa Antibiotics    Other reaction(s): Unknown   Tolmetin    Other Reaction: Allergy   Amlodipine   Besylate Itching, Rash   arms, stomach and forehead   Iron  Nausea And Vomiting        Medication List     STOP taking these medications    cefadroxil  500 MG capsule Commonly known as: DURICEF   methylPREDNISolone  4 MG Tbpk tablet Commonly known as: MEDROL  DOSEPAK       TAKE these medications    Accu-Chek Guide Me  w/Device Kit Use device to check glucose daily and as need for prediabetes R73.03   Accu-Chek Guide Test test strip Generic drug: glucose blood Use 1 test strip to check glucose once daily and as needed for prediabetes R73.03   Accu-Chek Softclix Lancets lancets Use 1 lancet to check glucose once daily and as needed for prediabetes R73.03   acetaminophen  500 MG tablet Commonly known as: TYLENOL  Take 2 tablets (1,000 mg total) by mouth every 6 (six) hours as needed.   alendronate  70 MG tablet Commonly known as: FOSAMAX  TAKE 1 TABLET BY MOUTH WEEKLY  TAKE WITH A FULL GLASS OF WATER   ON AN EMPTY STOMACH   busPIRone  10 MG tablet Commonly known as: BUSPAR  TAKE 1 TABLET(10 MG) BY MOUTH TWICE DAILY   clonazePAM  1 MG tablet Commonly known as: KLONOPIN  Take 0.5-1 tablets (0.5-1 mg total) by mouth 2 (two) times daily as needed for anxiety.   diclofenac  Sodium 1 % Gel Commonly known as: VOLTAREN  Apply 4 g topically 4 (four) times daily.   DULoxetine  60 MG capsule Commonly known as: CYMBALTA  Take 1 capsule (60 mg total) by mouth at bedtime.   Dupixent  300 MG/2ML Soaj Generic drug: Dupilumab  Inject 300 mg into the skin every 14 (fourteen) days. **loading dose completed in office on 07/02/23**   Eliquis  5 MG Tabs tablet Generic drug: apixaban  TAKE 1 TABLET(5 MG) BY MOUTH TWICE DAILY   fluticasone -salmeterol 250-50 MCG/ACT Aepb Commonly known as: Wixela Inhub Inhale 1 puff into the lungs in the morning and at bedtime.   gabapentin  300 MG capsule Commonly known as: NEURONTIN  Take 1 capsule (300 mg total) by mouth 2 (two) times daily.   HYDROcodone -acetaminophen  5-325 MG tablet Commonly known as: NORCO/VICODIN Take 1 tablet by mouth 2 (two) times daily as needed for severe pain (pain score 7-10).   ipratropium-albuterol  0.5-2.5 (3) MG/3ML Soln Commonly known as: DUONEB Take 3 mLs by nebulization every 6 (six) hours as needed.   Combivent  Respimat 20-100 MCG/ACT Aers  respimat Generic drug: Ipratropium-Albuterol  1 puff every 6 (six) hours.   lidocaine  5 % Commonly known as: Lidoderm  Place 1 patch onto the skin daily. To affected area of the neck. Remove & Discard patch within 12 hours or as directed by MD What changed:  when to take this reasons to take this   methocarbamol  750 MG tablet Commonly known as: ROBAXIN  Take 1 tablet (750 mg total) by mouth every 8 (eight) hours as needed for muscle spasms.   montelukast  10 MG tablet Commonly known as: SINGULAIR  TAKE 1 TABLET(10 MG) BY MOUTH AT BEDTIME   ondansetron  4 MG tablet Commonly known as: ZOFRAN  TAKE 1 TABLET BY MOUTH TWICE DAILY AS NEEDED   oxybutynin  10 MG 24 hr tablet Commonly known as: DITROPAN -XL TAKE 1 TABLET(10 MG) BY MOUTH AT BEDTIME   pantoprazole  40 MG tablet Commonly known as: PROTONIX  Take 1 tablet (40 mg total) by mouth 2 (two) times daily.   polyethylene glycol 17 g packet Commonly known as: MIRALAX  / GLYCOLAX  Take 17 g by mouth 2 (two) times daily. What changed:  when  to take this reasons to take this   potassium chloride  10 MEQ tablet Commonly known as: Klor-Con  10 Take 1 tablet (10 mEq total) by mouth daily.   rOPINIRole  1 MG tablet Commonly known as: REQUIP  TAKE 1 TABLET(1 MG) BY MOUTH AT BEDTIME   SUMAtriptan  100 MG tablet Commonly known as: IMITREX  Take 1 tablet (100 mg total) by mouth every 2 (two) hours as needed (for migraine headaches.). May repeat in 1 hours if headache persists or recurs.   traZODone  150 MG tablet Commonly known as: DESYREL  TAKE 1 TABLET(150 MG) BY MOUTH AT BEDTIME        Follow-up Information     Lane Shope, MD. Go on 12/09/2023.   Specialty: General Surgery Why: Go to appointment on 09/18 at 900 AM Contact information: [address] [place] [address] [phone]                Discharge Exam: There were no vitals filed for this visit. Constitutional: NAD, calm, comfortable Neck:  normal, supple, no masses, no thyromegaly Respiratory: clear to auscultation bilaterally, no wheezing, no crackles. Cardiovascular: RRR, no murmurs / rubs / gallops. No extremity edema Abdomen: Soft, mild tenderness expected, nondistended, + BS Skin: no rashes, lesions, ulcers. No induration Neurologic: No gross focal deficits  Condition at discharge: good  The results of significant diagnostics from this hospitalization (including imaging, microbiology, ancillary and laboratory) are listed below for reference.   Imaging Studies: DG Abd Portable 1V Result Date: 11/26/2023 CLINICAL DATA:  NG tube placement EXAM: PORTABLE ABDOMEN - 1 VIEW COMPARISON:  CT today FINDINGS: NG tube tip near the GE junction, possibly within the hiatal hernia seen on CT. IMPRESSION: NG tube tip likely within the hiatal hernia. Electronically Signed   By: Franky Crease M.D.   On: 11/26/2023 23:26   CT ABDOMEN PELVIS W CONTRAST Result Date: 11/26/2023 CLINICAL DATA:  Abdominal pain, vomiting EXAM: CT ABDOMEN AND PELVIS WITH CONTRAST TECHNIQUE: Multidetector CT imaging of the abdomen and pelvis was performed using the standard protocol following bolus administration of intravenous contrast. RADIATION DOSE REDUCTION: This exam was performed according to the departmental dose-optimization program which includes automated exposure control, adjustment of the mA and/or kV according to patient size and/or use of iterative reconstruction technique. CONTRAST:  OMNIPAQUE  IOHEXOL  300 MG/ML  SOLN COMPARISON:  04/24/2023 FINDINGS: Lower chest: Large hiatal hernia, stable.  No acute findings. Hepatobiliary: No focal liver abnormality is seen. Status post cholecystectomy. No biliary dilatation. Spleen is enlarged measuring 20 cm in craniocaudal length. This could reflect hepatomegaly or a Riedel's lobe. Pancreas: Fatty replacement. No focal abnormality or ductal dilatation. Spleen: No focal abnormality.  Normal size. Adrenals/Urinary  Tract: 3 mm nonobstructing stone in the upper pole of the right kidney. No ureteral stones or hydronephrosis. No suspicious renal or adrenal abnormality. Urinary bladder unremarkable. Stomach/Bowel: Sigmoid diverticulosis. No active diverticulitis. Abnormal appearing segments of distal small bowel in the pelvis with mild wall thickening and surrounding inflammation. There appears to be an area of narrowing and stricture best seen on images 40 6-47 of coronal series 5. This is immediately adjacent to the abnormally thickened appearing small bowel loop. Differential considerations would include adhesions in this area or inflammatory stricture. It is difficult to completely exclude internal hernia and ischemia. Small bowel loops proximal to this area are dilated with air-fluid levels compatible with a degree of small-bowel obstruction. Vascular/Lymphatic: No evidence of aneurysm or adenopathy. Reproductive: Prior hysterectomy.  No adnexal masses. Other: No  free fluid or free air. Musculoskeletal: No acute bony abnormality. IMPRESSION: Abnormal appearing distal small bowel within the pelvis with wall thickening and surrounding haziness/inflammation. Differential considerations would include enteritis (infectious, inflammatory or ischemic). There appears to be a stricture/area of narrowing with resulting small bowel obstruction with proximal loops fluid-filled with scattered air-fluid levels. While this could be related to enteritis and stricture or adhesions, cannot exclude internal hernia. Recommend surgical consultation. Liver measures enlarged at 20 cm in could reflect hepatomegaly or Riedel's lobe. Large hiatal hernia. These results were called by telephone at the time of interpretation on 11/26/2023 at 7:59 pm to provider KEVIN PADUCHOWSKI , who verbally acknowledged these results. Electronically Signed   By: Franky Crease M.D.   On: 11/26/2023 19:59   CT Head Wo Contrast Result Date: 11/11/2023 CLINICAL DATA:   Headache, new onset (Age >= 51y).  Dizziness. EXAM: CT HEAD WITHOUT CONTRAST TECHNIQUE: Contiguous axial images were obtained from the base of the skull through the vertex without intravenous contrast. RADIATION DOSE REDUCTION: This exam was performed according to the departmental dose-optimization program which includes automated exposure control, adjustment of the mA and/or kV according to patient size and/or use of iterative reconstruction technique. COMPARISON:  04/16/2023 FINDINGS: Brain: No acute intracranial abnormality. Specifically, no hemorrhage, hydrocephalus, mass lesion, acute infarction, or significant intracranial injury. Vascular: No hyperdense vessel or unexpected calcification. Skull: No acute calvarial abnormality. Sinuses/Orbits: No acute findings Other: None IMPRESSION: No acute intracranial abnormality. Electronically Signed   By: Franky Crease M.D.   On: 11/11/2023 20:38   DG Bone Density Result Date: 11/05/2023 EXAM: DUAL X-RAY ABSORPTIOMETRY (DXA) FOR BONE MINERAL DENSITY 11/04/2023 1:06 pm CLINICAL DATA:  68 year old Female Postmenopausal. Postmenopausal estrogen deficient History of fragility fracture. Patient is or has been on bone building therapies. TECHNIQUE: An axial (e.g., hips, spine) and/or appendicular (e.g., radius) exam was performed, as appropriate, using GE Secretary/administrator at [facility]. Images are obtained for bone mineral density measurement and are not obtained for diagnostic purposes. [id number] Exclusions: Lumbar spine due to degenerative changes. COMPARISON:  None. FINDINGS: Scan quality: Good. LEFT FEMORAL NECK: BMD (in g/cm2): 0.799 T-score: -1.7 Z-score: -0.1 LEFT TOTAL HIP: BMD (in g/cm2): 0.876 T-score: -1.0 Z-score: 0.3 RIGHT FEMORAL NECK: BMD (in g/cm2): 0.916 T-score: -0.9 Z-score: 0.7 RIGHT TOTAL HIP: BMD (in g/cm2): 0.910 T-score: -0.8 Z-score: 0.6 LEFT FOREARM (RADIUS 33%): BMD (in g/cm2): 0.584 T-score: -3.3 Z-score: -1.7 FRAX  10-YEAR PROBABILITY OF FRACTURE: FRAX not reported as the lowest BMD is not in the osteopenia range. IMPRESSION: Osteoporosis based on BMD. Fracture risk is unknown due to history of bone building therapy. RECOMMENDATIONS: 1. All patients should optimize calcium and vitamin D  intake. 2. Consider FDA-approved medical therapies in postmenopausal women and men aged 67 years and older, based on the following: - A hip or vertebral (clinical or morphometric) fracture - T-score less than or equal to -2.5 and secondary causes have been excluded. - Low bone mass (T-score between -1.0 and -2.5) and a 10-year probability of a hip fracture greater than or equal to 3% or a 10-year probability of a major osteoporosis-related fracture greater than or equal to 20% based on the [place] -adapted WHO algorithm. - Clinician judgment and/or patient preferences may indicate treatment for people with 10-year fracture probabilities above or below these levels 3. Patients with diagnosis of osteoporosis or at high risk for fracture should have regular bone mineral density tests. For patients eligible for Medicare, routine testing is allowed  once every 2 years. The testing frequency can be increased to one year for patients who have rapidly progressing disease, those who are receiving or discontinuing medical therapy to restore bone mass, or have additional risk factors. Electronically Signed   By: Dina  Arceo M.D.   On: 11/05/2023 14:18   DG PAIN CLINIC C-ARM 1-60 MIN NO REPORT Result Date: 11/03/2023 Fluoro was used, but no Radiologist interpretation will be provided. Please refer to NOTES tab for provider progress note.   Microbiology: Results for orders placed or performed during the hospital encounter of 11/26/23  MRSA Next Gen by PCR, Nasal     Status: None   Collection Time: 11/27/23  1:08 AM   Specimen: Nasal Mucosa; Nasal Swab  Result Value Ref Range Status   MRSA by PCR Next Gen NOT DETECTED NOT DETECTED Final    Comment:  (NOTE) The GeneXpert MRSA Assay (FDA approved for NASAL specimens only), is one component of a comprehensive MRSA colonization surveillance program. It is not intended to diagnose MRSA infection nor to guide or monitor treatment for MRSA infections. Test performance is not FDA approved in patients less than 24 years old. Performed at [facility], [address] [place], [place] [address]     Labs: CBC: Recent Labs  Lab 11/26/23 1739 11/27/23 0116 11/28/23 0649 11/29/23 0415 11/30/23 0649  WBC 11.7* 9.7 13.9* 10.4  --   HGB 9.2* 8.1* 7.4* 7.7* 9.2*  HCT 29.0* 25.5* 23.7* 24.4*  --   MCV 80.8 81.2 81.2 81.9  --   PLT 567* 465* 461* 435*  --    Basic Metabolic Panel: Recent Labs  Lab 11/26/23 1739 11/27/23 0116 11/27/23 2226 11/28/23 0649 11/29/23 0415 11/30/23 0649  NA 134* 135  --  139 140 140  K 3.1* 3.1* 3.7 3.9 3.4* 3.5  CL 94* 98  --  100 103 103  CO2 26 24  --  27 28 29   GLUCOSE 145* 122*  --  94 93 100*  BUN 11 8  --  12 6* <5*  CREATININE 0.62 0.43*  --  0.39* 0.58 0.38*  CALCIUM 8.5* 7.8*  --  8.0* 7.7* 7.9*  MG  --  1.5* 2.2 1.9 1.7 1.9   Liver Function Tests: Recent Labs  Lab 11/26/23 1739 11/27/23 0116  AST 23 15  ALT 17 16  ALKPHOS 74 63  BILITOT 0.6 0.5  PROT 6.9 5.9*  ALBUMIN 2.7* 2.5*   CBG: Recent Labs  Lab 11/29/23 1225 11/29/23 1656 11/29/23 2101 11/30/23 0826 11/30/23 1146  GLUCAP 126* 88 103* 120* 148*    Discharge time spent: greater than 30 minutes.  Signed: Laree Lock, MD [facility] 11/30/2023

## 2023-11-30 NOTE — Progress Notes (Signed)
 Physical Therapy Treatment Patient Details Name: Summer Murphy MRN: [id number] DOB: December 17, 1955 Today's Date: 11/30/2023   History of Present Illness Pt is a 68 y.o. female admitted with SBO 2 Days Post-Op s/p exploratory laparotomy and LOA for internal hernia. Significant PMH includes: type 2 diabetes, GERD, chronic pain, COPD, HTN, anxiety, mult abdominal surgeries, asthma, arthritis s/p cervical fusion and drainage of bilateral psoas    PT Comments  Pt was supine in bed with HOB elevated ~ 20 degrees. She is A and O x 4. I want to get well so I can go home. Pt agrees to session and remains cooperative throughout. Pt demonstrated safe abilities to exit bed, stand to RW, and tolerate ambulation ~ 120 ft. No LOB with use of RW. Used RW at baseline and is encouraged to continue to use until HHPT able to improve safe functional balance and safety with ADLs. Pt tolerated session well however will benefit from post acute PT to maximize her safety and independence with all daily activities.    If plan is discharge home, recommend the following: A little help with bathing/dressing/bathroom;Assistance with cooking/housework;Help with stairs or ramp for entrance;Assist for transportation     Equipment Recommendations  None recommended by PT       Precautions / Restrictions Precautions Precautions: Fall Recall of Precautions/Restrictions: Intact Restrictions Weight Bearing Restrictions Per Provider Order: No     Mobility  Bed Mobility Overal bed mobility: Needs Assistance Bed Mobility: Supine to Sit, Sit to Supine  Supine to sit: Supervision Sit to supine: Supervision General bed mobility comments: encouraged to hold pillow against stomach with rolling to side to sit EOB    Transfers Overall transfer level: Needs assistance Equipment used: Rolling walker (2 wheels) Transfers: Sit to/from Stand Sit to Stand: Supervision   Ambulation/Gait Ambulation/Gait assistance: Supervision Gait  Distance (Feet): 120 Feet Assistive device: Rolling walker (2 wheels) Gait Pattern/deviations: Step-through pattern Gait velocity: decreased  General Gait Details: pt demonstrated safe steady gait kinematics without LOB or safety concern. Distnace limited by pain more so than fatigue     Balance Overall balance assessment: Modified Independent Sitting-balance support: Feet supported Sitting balance-Leahy Scale: Good     Standing balance support: During functional activity, Reliant on assistive device for balance Standing balance-Leahy Scale: Good       Communication Communication Communication: No apparent difficulties  Cognition Arousal: Alert Behavior During Therapy: WFL for tasks assessed/performed   PT - Cognitive impairments: No apparent impairments      PT - Cognition Comments: Pt Is a and O x4 Following commands: Intact      Cueing Cueing Techniques: Verbal cues, Tactile cues         Pertinent Vitals/Pain Pain Assessment Pain Assessment: 0-10 Pain Score: 5  Pain Location: abdomen Pain Descriptors / Indicators: Sore Pain Intervention(s): Limited activity within patient's tolerance, Monitored during session, Premedicated before session, Repositioned     PT Goals (current goals can now be found in the care plan section) Acute Rehab PT Goals Patient Stated Goal: to go home Progress towards PT goals: Progressing toward goals    Frequency    Min 2X/week       AM-PAC PT 6 Clicks Mobility   Outcome Measure  Help needed turning from your back to your side while in a flat bed without using bedrails?: A Little Help needed moving from lying on your back to sitting on the side of a flat bed without using bedrails?: A Little Help needed moving to and  from a bed to a chair (including a wheelchair)?: A Little Help needed standing up from a chair using your arms (e.g., wheelchair or bedside chair)?: A Little Help needed to walk in hospital room?: A Little Help  needed climbing 3-5 steps with a railing? : A Little 6 Click Score: 18    End of Session         PT Visit Diagnosis: Unsteadiness on feet (R26.81);Muscle weakness (generalized) (M62.81)     Time: 9194-9181 PT Time Calculation (min) (ACUTE ONLY): 13 min  Charges:    $Gait Training: 8-22 mins PT General Charges $$ ACUTE PT VISIT: 1 Visit                     Rankin Essex PTA 11/30/23, 9:21 AM

## 2023-11-30 NOTE — Plan of Care (Signed)

## 2023-11-30 NOTE — Progress Notes (Signed)
 [facility] SURGICAL PROGRESS NOTE  Hospital Day(s): 4.   Post op day(s): 3 Days Post-Op.   Interval History:  Patient seen and examined No acute events or new complaints overnight.  Patient reports she is doing better Abdomen is sore expectedly No fever, chills, nausea, emesis Hgb to 9.2 Renal function normal; sCr - 0.38; UO - 300 ccs + unmeasured amounts  Hypokalemia to 3.4 She is on FLD; no issues - did not have diet advanced yesterday despite orders She is having bowel function   Vital signs in last 24 hours: [min-max] current  Temp:  [97.8 F (36.6 C)-98.5 F (36.9 C)] 98.2 F (36.8 C) (09/09 0429) Pulse Rate:  [90-100] 90 (09/09 0429) Resp:  [16-20] 17 (09/09 0429) BP: (102-130)/(40-70) 130/70 (09/09 0429) SpO2:  [93 %-100 %] 93 % (09/09 0429)     Height: 4' 11 (149.9 cm)       Intake/Output last 2 shifts:  09/08 0701 - 09/09 0700 In: 1565.6 [P.O.:840; I.V.:725.6] Out: -    Physical Exam:  Constitutional: alert, cooperative and no distress  Respiratory: breathing non-labored at rest  Cardiovascular: regular rate and sinus rhythm  Gastrointestinal: soft, expected incisional soreness, and non-distended. No rebound/guarding.  Integumentary: Laparotomy is CDI with staples, no erythema, no drainage  Labs:     Latest Ref Rng & Units 11/30/2023    6:49 AM 11/29/2023    4:15 AM 11/28/2023    6:49 AM  CBC  WBC 4.0 - 10.5 K/uL  10.4  13.9   Hemoglobin 12.0 - 15.0 g/dL 9.2  7.7  7.4   Hematocrit 36.0 - 46.0 %  24.4  23.7   Platelets 150 - 400 K/uL  435  461       Latest Ref Rng & Units 11/30/2023    6:49 AM 11/29/2023    4:15 AM 11/28/2023    6:49 AM  CMP  Glucose 70 - 99 mg/dL 899  93  94   BUN 8 - 23 mg/dL 5  6  12    Creatinine 0.44 - 1.00 mg/dL 9.61  9.41  9.60   Sodium 135 - 145 mmol/L 140  140  139   Potassium 3.5 - 5.1 mmol/L 3.5  3.4  3.9   Chloride 98 - 111 mmol/L 103  103  100   CO2 22 - 32 mmol/L 29  28  27    Calcium 8.9 - 10.3 mg/dL 7.9   7.7  8.0      Imaging studies: No new pertinent imaging studies   Assessment/Plan:  68 y.o. female 3 Days Post-Op s/p exploratory laparotomy and LOA for internal hernia   - Okay for soft/regular diet  - Monitor abdominal examination; on-going bowel function  - Pain control prn; antiemetics prn  - Mobilize; okay to work with therapies   - Further management per primary service; we will follow     - Discharge Planning: From a surgical perspective, okay for discharge once medically cleared. She will need to follow up in 7-10 days for staple removal. I will update instructions and follow up.   All of the above findings and recommendations were discussed with the patient, and the medical team, and all of patient's questions were answered to her expressed satisfaction.  -- [name], PA-C [facility] 11/30/2023, 8:11 AM M-F: 7am - 4pm

## 2023-11-30 NOTE — Discharge Instructions (Signed)
 In addition to included general post-operative instructions,  Diet: Resume home diet.   Activity: No heavy lifting >20 pounds (children, pets, laundry, garbage) or strenuous activity for 6 weeks, but light activity and walking are encouraged. Do not drive or drink alcohol if taking narcotic pain medications or having pain that might distract from driving.  Wound care: You may shower/get incision wet with soapy water  and pat dry (do not rub incisions), but no baths or submerging incision underwater until follow-up. We will remove staples in follow up on 09/18  Medications: Resume all home medications. For mild to moderate pain: acetaminophen  (Tylenol ) or ibuprofen /naproxen (if no kidney disease). Combining Tylenol  with alcohol can substantially increase your risk of causing liver disease. Narcotic pain medications, if prescribed, can be used for severe pain, though may cause nausea, constipation, and drowsiness. Do not combine Tylenol  and Percocet (or similar) within a 6 hour period as Percocet (and similar) contain(s) Tylenol . If you do not need the narcotic pain medication, you do not need to fill the prescription.  Call office [phone] / [phone]) at any time if any questions, worsening pain, fevers/chills, bleeding, drainage from incision site, or other concerns.

## 2023-12-01 ENCOUNTER — Ambulatory Visit: Admitting: Neurosurgery

## 2023-12-02 ENCOUNTER — Telehealth: Payer: Self-pay | Admitting: Neurosurgery

## 2023-12-02 ENCOUNTER — Encounter: Payer: Self-pay | Admitting: Oncology

## 2023-12-02 ENCOUNTER — Telehealth: Payer: Self-pay | Admitting: Student in an Organized Health Care Education/Training Program

## 2023-12-02 NOTE — Telephone Encounter (Signed)
 Patient called stating she just had stomach surgery and will not be able to come in for PPE appt next week. She states she is doing wonderful with back pain. Whatever Dr Marcelino did it worked. She will call to reschedule when she needs us .

## 2023-12-02 NOTE — Telephone Encounter (Signed)
 I spoke with Dorothe and Amy with [facility]. This home health referral was from her recent hospitalization and surgery with [facility]. They will contact that office for orders.

## 2023-12-02 NOTE — Telephone Encounter (Signed)
 Dorothe from [facility] is calling to get a verbal on nursing orders for this patient. She states that the case manager entered nursing but it was not placed on the orders.

## 2023-12-03 ENCOUNTER — Telehealth: Payer: Self-pay

## 2023-12-03 NOTE — Telephone Encounter (Signed)
 [facility] home health physical therapy almira [phone] called for verbal order gave verbal for PT once a week for 6 weeks home health order given from hospital

## 2023-12-07 ENCOUNTER — Ambulatory Visit: Admitting: Student in an Organized Health Care Education/Training Program

## 2023-12-08 ENCOUNTER — Other Ambulatory Visit (HOSPITAL_COMMUNITY): Payer: Self-pay

## 2023-12-09 ENCOUNTER — Encounter: Payer: Self-pay | Admitting: Surgery

## 2023-12-09 ENCOUNTER — Ambulatory Visit (INDEPENDENT_AMBULATORY_CARE_PROVIDER_SITE_OTHER): Admitting: Surgery

## 2023-12-09 VITALS — BP 102/54 | HR 104 | Ht 59.5 in | Wt 109.8 lb

## 2023-12-09 DIAGNOSIS — Z9889 Other specified postprocedural states: Secondary | ICD-10-CM

## 2023-12-09 DIAGNOSIS — Z09 Encounter for follow-up examination after completed treatment for conditions other than malignant neoplasm: Secondary | ICD-10-CM

## 2023-12-09 DIAGNOSIS — K56609 Unspecified intestinal obstruction, unspecified as to partial versus complete obstruction: Secondary | ICD-10-CM

## 2023-12-09 NOTE — Progress Notes (Signed)
 [facility] POST-OP OFFICE VISIT  12/09/2023  HPI: Summer Murphy is a 68 y.o. female had surgery on November 27, 2023, now s/p ex lap with lysis of band adhesion causing internal hernia.  She reports good pain control, her bowel movements are daily.  Many of her staples have come out, a lot of this due to the laxity of her abdominal skin.  She denies any fevers or chills, denies any food intolerance, nausea or vomiting.  Vital signs: BP (!) 102/54   Pulse (!) 104   Ht 4' 11.5 (1.511 m)   Wt 109 lb 12.8 oz (49.8 kg)   SpO2 98%   BMI 21.81 kg/m    Physical Exam: Constitutional: This is the best I have seen her.  She looks well mobile today.  Wearing her knee brace. Abdomen: Clearly nondistended, flaccid and soft.  Many staples are gone with incision appearing intact.  1 layer of overlap which has a slight raw focus, but no evidence of inflammation or erythema or drainage. Skin: Healing nicely.  Assessment/Plan: This is a 68 y.o. female status post ex lap for internal hernia, underwent lysis of band adhesion without need for resection.  Patient Active Problem List   Diagnosis Date Noted   Internal hernia 11/27/2023   Hyponatremia 11/26/2023   Small bowel obstruction (HCC) 11/26/2023   Hypokalemia 10/29/2023   Age-related osteoporosis without current pathological fracture 09/20/2023   Vertebral osteomyelitis (HCC) 04/26/2023   Lumbar discitis 04/26/2023   Infection of cervical spine (HCC) 04/26/2023   Surgical site infection 04/19/2023   Falls 04/16/2023   Chronic pain syndrome    S/P cervical spinal fusion 04/12/2023   Cervical myelopathy (HCC) 03/02/2023   T2DM (type 2 diabetes mellitus) (HCC) 02/28/2023   Stenosis of cervical spine with myelopathy (HCC) 02/04/2023   Cervical radicular pain (left severe) 12/10/2022   Foraminal stenosis of cervical region 11/25/2022   Cervical disc disorder with radiculopathy of cervical region 11/25/2022   Cervicogenic  headache 11/25/2022   Chronic asthmatic bronchitis (HCC) 06/12/2022   Anxiety and depression 06/02/2022   Essential hypertension 06/02/2022   Asthma 06/01/2022   DOE (dyspnea on exertion) 05/13/2022   Hypocalcemia 07/31/2021   Esophageal dysphagia    Benign esophageal stricture    S/P hardware removal 11/14/2020   Iron  deficiency anemia 10/18/2020   S/P revision of total knee, right 11/14/2019   Seasonal allergic rhinitis due to pollen 09/20/2019   Callus of foot 08/13/2019   Restless leg 04/30/2019   Diastolic dysfunction 12/14/2018   Acid reflux 12/14/2018   Recurrent sinusitis 08/15/2018   Migraine 04/22/2018   Nausea 04/22/2018   Polyneuropathy associated with underlying disease (HCC) 04/22/2018   Midline low back pain without sciatica 04/22/2018   Allergic rhinitis 07/01/2017   Moderate asthma without complication 01/20/2016   COPD (chronic obstructive pulmonary disease) (HCC) 09/10/2014   Calculi, ureter 04/26/2013   Corneal graft malfunction 08/17/2012   Calculus of kidney 04/26/2012   Urge incontinence 04/26/2012   Diaphragmatic hernia 10/27/2010   Barrett esophagus 07/07/2010    - She may follow-up as needed.  Encouraged her that we always be glad to see her if an issue arose.    Honor Leghorn M.D., FACS 12/09/2023, 9:17 AM

## 2023-12-09 NOTE — Patient Instructions (Addendum)
 Follow-up with our office as needed.  Please call and ask to speak with a nurse if you develop questions or concerns.   High-Protein and High-Calorie Diet Eating high-protein and high-calorie foods can help you to gain weight, heal after an injury, and get better after an illness or surgery. The amount of daily protein and calories you need depends on: Your body weight. The reason you were told to follow this diet. Usually, a high-protein, high-calorie diet means that you should: Eat 250-500 extra calories each day. Make sure that you get enough of your daily calories from protein. Ask your health care provider how many of your calories should come from protein and how many calories total you need each day. Follow the diet as told by your provider. What are tips for following this plan? Reading food labels Check the nutrition facts label for calories, and grams of fat and protein. Items with more than 4 grams of protein are high-protein foods. General information  Ask your provider if you should take a nutritional supplement. Try to eat six small meals each day instead of three large meals. A goal is usually to eat every 2 to 3 hours. Eat a balanced diet. In each meal, include one food that's high in protein and one food with fat in it. Keep nutritious snacks available, such as nuts, trail mixes, dried fruit, and whole-milk yogurt. If you have kidney disease or diabetes, talk with your provider about how much protein is safe for you. Too much protein may put extra stress on your kidneys. Replace zero-calorie drinks with drinks that have calories in them, such as milk and 100% fruit juice. Consider setting a timer to remind you to eat. You'll want to eat even if you do not feel very hungry. Preparing meals Milk and dairy foods. Add whole milk, half-and-half, or heavy cream to cereal, pudding, soup, or hot cocoa. Add whole milk to instant breakfast drinks. Add powdered milk to baked  goods, smoothies, or milkshakes. Add powdered milk, cream, or butter to mashed potatoes. Replace water  with milk or heavy cream when making foods such as oatmeal, pudding, or cocoa. Make cream-based pastas and soups. Add cheese to cooked vegetables. Make whole-milk yogurt parfaits. Top them with granola, fruit, or nuts. Add cottage cheese to fruit. Add cream cheese to sandwiches or as a topping on crackers and bread. Eggs. Add hard-boiled eggs to salads. Keep hard-boiled eggs in the fridge to snack on. Add cheese to cooked eggs. Beans, nuts, and seeds. Add peanut butter to oatmeal or smoothies. Use peanut butter as a dip for fruits and vegetables or as a topping for pretzels, celery, or crackers. Add beans to casseroles, dips, and spreads. Add pureed beans to sauces and soups. Salads, soups, and other foods. Add avocado, cheese, or both to sandwiches or salads. Add avocado to smoothies. Add meat, poultry, or seafood to rice, pasta, casseroles, salads, and soups. Use mayonnaise when making egg salad, chicken salad, or tuna salad. Add oil or butter to cooked vegetables and grains. What high-protein foods should I eat?  Vegetables Soybeans. Peas. Grains Quinoa. Bulgur wheat. Buckwheat. Meats and other proteins Beef, pork, and poultry. Fish and seafood. Eggs. Tofu. Textured vegetable protein (TVP). Peanut butter. Nuts and seeds. Dried beans. Protein powders. Hummus. Jerky. Dairy Whole milk. Whole-milk yogurt. Powdered milk. Cheese. Cottage cheese. Eggnog. Beverages High-protein supplement drinks. Soy milk. Other foods Protein bars. The items listed above may not be all the foods and drinks you can have. Talk  with an expert in healthy eating called a dietitian to learn more. What high-calorie foods should I eat? Fruits Dried fruit. Fruit leather. Canned fruit in syrup. Fruit juice. Avocado. Vegetables Vegetables cooked in oil or butter. Fried potatoes. Grains Pasta. Quick  breads. Muffins. Pancakes. Granola. Meats and other proteins Peanut butter and other nut butters. Nuts and seeds. Dairy Heavy cream. Whipped cream. Cream cheese. Sour cream. Ice cream. Custard. Pudding. Whole-milk dairy products. Beverages Meal-replacement beverages. Nutrition shakes. Fruit juice. Seasonings and condiments Salad dressing. Mayonnaise. Alfredo sauce. Fruit preserves or jelly. Honey. Syrup. Sweets and desserts Cake. Cookies. Pie. Pastries. Candy bars. Chocolate. Fats and oils Butter or margarine. Oil. Gravy. Other foods Meal-replacement bars. The items listed above may not be all the foods and drinks you can have. Talk with an expert in healthy eating to learn more. This information is not intended to replace advice given to you by your health care provider. Make sure you discuss any questions you have with your health care provider. Document Revised: 08/03/2022 Document Reviewed: 08/03/2022 Elsevier Patient Education  2024 Elsevier Inc.  GENERAL POST-OPERATIVE PATIENT INSTRUCTIONS   WOUND CARE INSTRUCTIONS:  Keep a dry clean dressing on the wound if there is drainage. The initial bandage may be removed after 24 hours.  Once the wound has quit draining you may leave it open to air.  If clothing rubs against the wound or causes irritation and the wound is not draining you may cover it with a dry dressing during the daytime.  Try to keep the wound dry and avoid ointments on the wound unless directed to do so.  If the wound becomes bright red and painful or starts to drain infected material that is not clear, please contact your physician immediately.  If the wound is mildly pink and has a thick firm ridge underneath it, this is normal, and is referred to as a healing ridge.  This will resolve over the next 4-6 weeks.  BATHING: You may shower if you have been informed of this by your surgeon. However, Please do not submerge in a tub, hot tub, or pool until incisions are  completely sealed or have been told by your surgeon that you may do so.  DIET:  You may eat any foods that you can tolerate.  It is a good idea to eat a high fiber diet and take in plenty of fluids to prevent constipation.  If you do become constipated you may want to take a mild laxative or take ducolax tablets on a daily basis until your bowel habits are regular.  Constipation can be very uncomfortable, along with straining, after recent surgery.  ACTIVITY:  You are encouraged to walk and engage in light activity for the next two weeks.  You should not lift more than 20 pounds for 6 weeks total after surgery as it could put you at increased risk for complications.  Twenty pounds is roughly equivalent to a plastic bag of groceries. At that time- Listen to your body when lifting, if you have pain when lifting, stop and then try again in a few days. Soreness after doing exercises or activities of daily living is normal as you get back in to your normal routine.  MEDICATIONS:  Try to take narcotic medications and anti-inflammatory medications, such as tylenol , ibuprofen , naprosyn, etc., with food.  This will minimize stomach upset from the medication.  Should you develop nausea and vomiting from the pain medication, or develop a rash, please discontinue the medication  and contact your physician.  You should not drive, make important decisions, or operate machinery when taking narcotic pain medication.  SUNBLOCK Use sun block to incision area over the next year if this area will be exposed to sun. This helps decrease scarring and will allow you avoid a permanent darkened area over your incision.  QUESTIONS:  Please feel free to call our office if you have any questions, and we will be glad to assist you. [phone]

## 2023-12-13 ENCOUNTER — Other Ambulatory Visit: Payer: Self-pay | Admitting: Nurse Practitioner

## 2023-12-13 ENCOUNTER — Other Ambulatory Visit: Payer: Self-pay

## 2023-12-13 ENCOUNTER — Telehealth: Payer: Self-pay | Admitting: Nurse Practitioner

## 2023-12-13 ENCOUNTER — Other Ambulatory Visit (HOSPITAL_COMMUNITY): Payer: Self-pay

## 2023-12-13 DIAGNOSIS — F3342 Major depressive disorder, recurrent, in full remission: Secondary | ICD-10-CM

## 2023-12-13 DIAGNOSIS — E1169 Type 2 diabetes mellitus with other specified complication: Secondary | ICD-10-CM

## 2023-12-13 DIAGNOSIS — M816 Localized osteoporosis [Lequesne]: Secondary | ICD-10-CM

## 2023-12-13 DIAGNOSIS — Z760 Encounter for issue of repeat prescription: Secondary | ICD-10-CM

## 2023-12-13 DIAGNOSIS — E876 Hypokalemia: Secondary | ICD-10-CM

## 2023-12-13 DIAGNOSIS — G2581 Restless legs syndrome: Secondary | ICD-10-CM

## 2023-12-13 DIAGNOSIS — F418 Other specified anxiety disorders: Secondary | ICD-10-CM

## 2023-12-13 DIAGNOSIS — M4802 Spinal stenosis, cervical region: Secondary | ICD-10-CM

## 2023-12-13 DIAGNOSIS — J301 Allergic rhinitis due to pollen: Secondary | ICD-10-CM

## 2023-12-13 DIAGNOSIS — M064 Inflammatory polyarthropathy: Secondary | ICD-10-CM

## 2023-12-13 DIAGNOSIS — K449 Diaphragmatic hernia without obstruction or gangrene: Secondary | ICD-10-CM

## 2023-12-13 DIAGNOSIS — M5412 Radiculopathy, cervical region: Secondary | ICD-10-CM

## 2023-12-13 NOTE — Telephone Encounter (Signed)
 Received call from [place] with [facility] requesting verbal order for therapy. Alyssa gave verbal order. I returned call to [facility]

## 2023-12-14 ENCOUNTER — Other Ambulatory Visit: Payer: Self-pay | Admitting: Student in an Organized Health Care Education/Training Program

## 2023-12-14 ENCOUNTER — Other Ambulatory Visit: Payer: Self-pay | Admitting: Nurse Practitioner

## 2023-12-14 ENCOUNTER — Other Ambulatory Visit: Payer: Self-pay

## 2023-12-14 DIAGNOSIS — K2100 Gastro-esophageal reflux disease with esophagitis, without bleeding: Secondary | ICD-10-CM

## 2023-12-14 DIAGNOSIS — J4489 Other specified chronic obstructive pulmonary disease: Secondary | ICD-10-CM

## 2023-12-14 NOTE — Progress Notes (Signed)
 Transfer requested from [facility]. Confirmed by phone that patient requested transfer. Dis-enrolling.

## 2023-12-16 ENCOUNTER — Other Ambulatory Visit: Payer: Self-pay | Admitting: Student in an Organized Health Care Education/Training Program

## 2023-12-16 DIAGNOSIS — J4489 Other specified chronic obstructive pulmonary disease: Secondary | ICD-10-CM

## 2023-12-16 DIAGNOSIS — J4550 Severe persistent asthma, uncomplicated: Secondary | ICD-10-CM

## 2023-12-16 NOTE — Telephone Encounter (Signed)
 Copied from CRM #[id number]. Topic: Clinical - Medication Refill >> Dec 16, 2023  4:03 PM Leila C wrote: Most Recent Pulmonary Care Visit:  Provider: Dr. Belva November Department: [facility] Date: 10/04/23 Medication(s): Dupilumab  (DUPIXENT ) 300 MG/2ML SOAJ and Wixela inhaler 250/50 mcg inhaler  Has the patient contacted their pharmacy? Chaney Pals from [facility] pharmacy [phone] and fax# [phone] needing refills for shipment next week.  (Agent: If no, request that the patient contact the pharmacy for the refill. If patient does not wish to contact the pharmacy document the reason why and proceed with request.) (Agent: If yes, when and what did the pharmacy advise?)  This is the patient's preferred pharmacy:  [facility], [place] - [address] [place] [address] Phone: [phone] Fax: [phone]  Is this the correct pharmacy for this prescription? Yes If no, delete pharmacy and type the correct one.   Has the prescription been filled recently? No  Is the patient out of the medication? No  Has the patient been seen for an appointment in the last year OR does the patient have an upcoming appointment? Yes, 01/19/24  Can we respond through MyChart?   Agent: Please be advised that Rx refills may take up to 3 business days. We ask that you follow-up with your pharmacy.

## 2023-12-16 NOTE — Telephone Encounter (Signed)
 Refill sent for DUPIXENT  to [facility]  Dose: 300mg  [place] every 14 days  Last OV: 10/04/2023. Provider: Dr. Isadora   Next OV: due October 2025 - not yet scheduled.   Routing to scheduling team for follow-up on appt scheduling  Aleck Puls, PharmD, BCPS Clinical Pharmacist  [facility]

## 2023-12-20 MED ORDER — ALENDRONATE SODIUM 70 MG PO TABS: ORAL_TABLET | ORAL | 3 refills | Status: AC

## 2023-12-20 MED ORDER — PANTOPRAZOLE SODIUM 40 MG PO TBEC: 40.0000 mg | DELAYED_RELEASE_TABLET | Freq: Two times a day (BID) | ORAL | 1 refills | Status: AC

## 2023-12-20 MED ORDER — POTASSIUM CHLORIDE ER 10 MEQ PO TBCR: 10.0000 meq | EXTENDED_RELEASE_TABLET | Freq: Every day | ORAL | 1 refills | Status: AC

## 2023-12-20 MED ORDER — SUMATRIPTAN SUCCINATE 100 MG PO TABS: 100.0000 mg | ORAL_TABLET | ORAL | 3 refills | Status: DC | PRN

## 2023-12-20 MED ORDER — MONTELUKAST SODIUM 10 MG PO TABS
ORAL_TABLET | ORAL | 3 refills | Status: AC
Start: 2023-12-20 — End: ?

## 2023-12-20 MED ORDER — ROPINIROLE HCL 1 MG PO TABS: ORAL_TABLET | ORAL | 1 refills | Status: AC

## 2023-12-20 MED ORDER — TRAZODONE HCL 150 MG PO TABS: ORAL_TABLET | ORAL | 3 refills | Status: AC

## 2023-12-21 ENCOUNTER — Telehealth: Payer: Self-pay

## 2023-12-21 MED ORDER — DICLOFENAC SODIUM 1 % EX GEL: 4.0000 g | Freq: Four times a day (QID) | CUTANEOUS | 1 refills | Status: AC

## 2023-12-21 MED ORDER — GABAPENTIN 300 MG PO CAPS
300.0000 mg | ORAL_CAPSULE | Freq: Two times a day (BID) | ORAL | 3 refills | Status: AC
Start: 2023-12-21 — End: ?

## 2023-12-21 NOTE — Telephone Encounter (Signed)
 Called to touch basis with patient to see if she had decided to proceed with osteoporosis treatment.  Her voice mail box is full and could not take messages.

## 2023-12-23 ENCOUNTER — Other Ambulatory Visit: Payer: Self-pay

## 2023-12-23 DIAGNOSIS — K2100 Gastro-esophageal reflux disease with esophagitis, without bleeding: Secondary | ICD-10-CM

## 2023-12-23 MED ORDER — ONDANSETRON HCL 4 MG PO TABS: ORAL_TABLET | ORAL | 1 refills | Status: AC

## 2023-12-25 ENCOUNTER — Other Ambulatory Visit: Payer: Self-pay | Admitting: Nurse Practitioner

## 2023-12-25 DIAGNOSIS — M4802 Spinal stenosis, cervical region: Secondary | ICD-10-CM

## 2023-12-25 DIAGNOSIS — M064 Inflammatory polyarthropathy: Secondary | ICD-10-CM

## 2023-12-25 DIAGNOSIS — M5412 Radiculopathy, cervical region: Secondary | ICD-10-CM

## 2023-12-27 ENCOUNTER — Other Ambulatory Visit: Payer: Self-pay

## 2023-12-27 ENCOUNTER — Telehealth: Payer: Self-pay | Admitting: Nurse Practitioner

## 2023-12-27 ENCOUNTER — Telehealth: Payer: Self-pay

## 2023-12-27 MED ORDER — FLUCONAZOLE 150 MG PO TABS: ORAL_TABLET | ORAL | 0 refills | Status: DC

## 2023-12-27 NOTE — Telephone Encounter (Signed)
 As per alyssa  sent diflucan 

## 2023-12-27 NOTE — Telephone Encounter (Signed)
 Received 12/10/23 therapy order from [facility]. Gave to Alyssa for signatures-Toni

## 2023-12-28 ENCOUNTER — Ambulatory Visit: Attending: Infectious Diseases | Admitting: Infectious Diseases

## 2023-12-28 DIAGNOSIS — M4642 Discitis, unspecified, cervical region: Secondary | ICD-10-CM | POA: Diagnosis not present

## 2023-12-28 DIAGNOSIS — G062 Extradural and subdural abscess, unspecified: Secondary | ICD-10-CM

## 2023-12-28 DIAGNOSIS — M4620 Osteomyelitis of vertebra, site unspecified: Secondary | ICD-10-CM | POA: Diagnosis present

## 2023-12-28 DIAGNOSIS — M4646 Discitis, unspecified, lumbar region: Secondary | ICD-10-CM

## 2023-12-28 DIAGNOSIS — J4489 Other specified chronic obstructive pulmonary disease: Secondary | ICD-10-CM | POA: Diagnosis not present

## 2023-12-28 DIAGNOSIS — F419 Anxiety disorder, unspecified: Secondary | ICD-10-CM | POA: Insufficient documentation

## 2023-12-28 DIAGNOSIS — D649 Anemia, unspecified: Secondary | ICD-10-CM | POA: Insufficient documentation

## 2023-12-28 DIAGNOSIS — R1310 Dysphagia, unspecified: Secondary | ICD-10-CM | POA: Insufficient documentation

## 2023-12-28 DIAGNOSIS — M4626 Osteomyelitis of vertebra, lumbar region: Secondary | ICD-10-CM | POA: Diagnosis not present

## 2023-12-28 DIAGNOSIS — Z79899 Other long term (current) drug therapy: Secondary | ICD-10-CM | POA: Insufficient documentation

## 2023-12-28 DIAGNOSIS — B9561 Methicillin susceptible Staphylococcus aureus infection as the cause of diseases classified elsewhere: Secondary | ICD-10-CM | POA: Insufficient documentation

## 2023-12-28 DIAGNOSIS — R7881 Bacteremia: Secondary | ICD-10-CM | POA: Insufficient documentation

## 2023-12-28 MED ORDER — CEFADROXIL 500 MG PO CAPS: 500.0000 mg | ORAL_CAPSULE | Freq: Two times a day (BID) | ORAL | 3 refills | Status: DC

## 2023-12-28 NOTE — Progress Notes (Signed)
 NAME: Summer Murphy  DOB: 06/18/1955  MRN: [id number]  Date/Time: 12/28/2023 11:32 AM   Subjective:  Pt here for follow up visit Video visit  She gave consent to the visit She is at home And I wa sin my office  Since her last visit she was hopsitalized for incarcerated rt inguinal hernia and underwent surgery on 11/27/23 at [facility]- doing very well now She is on Cefadroxil   for MSSA spine infection She has no diarrhea   Following taken from last visit note ANNALEISE BURGER is a 68 y.o female. with a history of cervical myelpathy for which she  underwent c3-c6 laminectomy and posterior fusion on 03/02/23, Asthma, COPD, barrettts esophagus, hiatus hernia, HTN,  B/l TKA  Admitted ti  [facility]  04/16/23-04/28/23 for MSSA bacteremia MRI of the cervical spine  showed collection both deep at the laminectomy bed and superficial at the site of surgical incision site. I/D by neurosurgeon on 04/17/23 and deep culture was MSSA  Pt was also c/o b/l groin pain and leg pain and had MRI of the lumbar spine on 04/25/23 and it showed discitis-osteomyelitis at L3-L4 and L4-L5. Small amount of enhancing fluid in the anterior epidural space between the L3 and L4 levels with suspected epidural abscess in the left lateral recess at the level of the L4 vertebral body. 2. Bilateral psoas muscle abscesses. IR aspiration of that fluid was no growth This was managed by Iv antibiotics   She was sent to SNF on 2/4 with 6 weeks of cefazolin  to finish on 06/07/23 But she was discharged from SNF on 05/18/23  because of insurance. She received weekly Dalbavancin X 3 doses at day surgery on 2/25, 3/4 and 3/11  She saw me on 3/18 and I started her on Po cefadroxil  1 gram BID for 8 weeks and then she went on cefadroxil  500mg  Po BID She continued to have pain in her shoulders/neck and neurosurgery did CT in may 2025  and that showed loosening screws bilateral C3 and C6 laminar screws, and also the right C4 screw.  3. No  postoperative arthrodesis identified, except for possible developing left C4-C5 facet fusion. Underlying degenerative appearing facet ankylosis on the left at C2-C3. They were concerned about osteoporosis and her ongoing weight loss She was seen by ortho PA  on 6/30 and ordered Dexa scan which was done on 8/14 . She is unable to take fosomax because of GERD She is followed by pulmonary and saw them on 7/14  for asthma and is on Dupilumab   it was decided to continue cefadroxil   500mg  Po BID indefinitely because of lossening screws on CT  On 7/22 Sed rate was 63 and CRP 3.2 The cefadroxil  was increased to 1 gram PO BID on 7/28 because of worsening neck pain. She saw pain management Dr.Lateef on 10/26/23. Received ESI to cervical spine area and nerve blck on 8/13 and is feeling much better. The high dose cefadroxil  caused diarrhea and cdiff was neg- it was stopped for a few days and now she has resumed it  Past Medical History:  Diagnosis Date   Acid reflux    Anemia    Anxiety    a.) buspirone  BID + BZO PRN (clonazepam )   Arthritis    Aspiration pneumonia (HCC) 06/02/2022   Asthma    CAP (community acquired pneumonia) 04/24/2022   Cervical disc disorder with radiculopathy of cervical region    Chronic pain syndrome    a.) on COT as prescribed by pain  mangement   Chronic, continuous use of opioids    a.) followed by pain mangement   COPD (chronic obstructive pulmonary disease) (HCC)    DOE (dyspnea on exertion)    Hematuria    Hiatal hernia    History of kidney stones    Hypertension    Hypertensive urgency 05/12/2022   Hypoglycemia    Insomnia    a.) uses Trazodone  PRN   LBBB (left bundle branch block)    Migraine    MSSA bacteremia 04/19/2023   Murmur    Osteopenia    Palpitations    Pre-diabetes    Restless leg    a.) on ropinirole    Sepsis (HCC) 05/12/2022   Stenosis of cervical spine with myelopathy (HCC)    T2DM (type 2 diabetes mellitus) (HCC)     Past Surgical  History:  Procedure Laterality Date   ABDOMINAL HYSTERECTOMY     AMPUTATION TOE Left 07/31/2016   Procedure: AMPUTATION TOE/MPJ 2nd toe;  Surgeon: Neill Boas, DPM;  Location: [facility];  Service: Podiatry;  Laterality: Left;   APPENDECTOMY  1990   BACK SURGERY     low back   BREAST SURGERY     bilateral breast reduction   CARDIAC ELECTROPHYSIOLOGY STUDY AND ABLATION     CERVICAL WOUND DEBRIDEMENT N/A 04/17/2023   Procedure: CERVICAL WOUND DEBRIDEMENT;  Surgeon: Bluford Standing, MD;  Location: [facility];  Service: Neurosurgery;  Laterality: N/A;   CHOLECYSTECTOMY  1990   COLONOSCOPY WITH PROPOFOL  N/A 01/04/2017   Procedure: COLONOSCOPY WITH PROPOFOL ;  Surgeon: Viktoria Lamar DASEN, MD;  Location: [facility] ENDOSCOPY;  Service: Endoscopy;  Laterality: N/A;   CORNEAL TRANSPLANT     ESOPHAGOGASTRODUODENOSCOPY N/A 04/09/2021   Procedure: ESOPHAGOGASTRODUODENOSCOPY (EGD);  Surgeon: Unk Corinn Skiff, MD;  Location: [facility] ENDOSCOPY;  Service: Gastroenterology;  Laterality: N/A;   ESOPHAGOGASTRODUODENOSCOPY (EGD) WITH PROPOFOL  N/A 01/04/2017   Procedure: ESOPHAGOGASTRODUODENOSCOPY (EGD) WITH PROPOFOL ;  Surgeon: Viktoria Lamar DASEN, MD;  Location: [facility] ENDOSCOPY;  Service: Endoscopy;  Laterality: N/A;   EXCISION BONE CYST Left 07/31/2016   Procedure: EXCISION BONE CYST/exostectomy 28124/left 2nd;  Surgeon: Neill Boas, DPM;  Location: [facility];  Service: Podiatry;  Laterality: Left;   EXTRACORPOREAL SHOCK WAVE LITHOTRIPSY Left 09/12/2015   Procedure: EXTRACORPOREAL SHOCK WAVE LITHOTRIPSY (ESWL);  Surgeon: Rosina Riis, MD;  Location: [facility];  Service: Urology;  Laterality: Left;   FRACTURE SURGERY     left foot   GUM SURGERY Left    gum infection   HARDWARE REMOVAL Left 11/14/2020   Procedure: HARDWARE REMOVAL;  Surgeon: Kathlynn Sharper, MD;  Location: [facility];  Service: Orthopedics;  Laterality: Left;   HH repair     Fundoplication   JOINT REPLACEMENT Bilateral 2013,2014   total knees   LAPAROSCOPIC  HYSTERECTOMY     LAPAROTOMY N/A 11/27/2023   Procedure: LAPAROTOMY, EXPLORATORY lysis of adhesions;  Surgeon: Lane Shope, MD;  Location: [facility];  Service: General;  Laterality: N/A;   LITHOTRIPSY     periprosthetic supracondylar fracture of left femur  02/16/2020   [facility]   POSTERIOR CERVICAL FUSION/FORAMINOTOMY N/A 03/02/2023   Procedure: C3-6 POSTERIOR CERVICAL LAMINECTOMY AND FUSION;  Surgeon: Claudene Riis ORN, MD;  Location: [facility];  Service: Neurosurgery;  Laterality: N/A;   TONSILLECTOMY     TOTAL KNEE REVISION Right 11/14/2019   Procedure: Revision patella and tibial polyethylene;  Surgeon: Kathlynn Sharper, MD;  Location: [facility];  Service: Orthopedics;  Laterality: Right;   URETEROSCOPY      Social History  Socioeconomic History   Marital status: Widowed    Spouse name: Not on file   Number of children: Not on file   Years of education: Not on file   Highest education level: Not on file  Occupational History   Not on file  Tobacco Use   Smoking status: Never    Passive exposure: Past   Smokeless tobacco: Never  Vaping Use   Vaping status: Never Used  Substance and Sexual Activity   Alcohol use: No   Drug use: No   Sexual activity: Not Currently  Other Topics Concern   Not on file  Social History Narrative   Lives at home alone. Relatives are the support person.    Social Drivers of Corporate investment banker Strain: Low Risk  (12/20/2023)   Received from [facility]   Overall Financial Resource Strain (CARDIA)    Difficulty of Paying Living Expenses: Not hard at all  Food Insecurity: No Food Insecurity (12/20/2023)   Received from [facility]   Hunger Vital Sign    Within the past 12 months, you worried that your food would run out before you got the money to buy more.: Never true    Within the past 12 months, the food you bought just didn't last and you didn't have money to get more.: Never true   Transportation Needs: No Transportation Needs (12/20/2023)   Received from [facility] - Transportation    In the past 12 months, has lack of transportation kept you from medical appointments or from getting medications?: No    Lack of Transportation (Non-Medical): No  Physical Activity: Not on file  Stress: Not on file  Social Connections: Moderately Integrated (11/26/2023)   Social Connection and Isolation Panel    Frequency of Communication with Friends and Family: More than three times a week    Frequency of Social Gatherings with Friends and Family: More than three times a week    Attends Religious Services: 1 to 4 times per year    Active Member of [employer] or Organizations: Yes    Attends Banker Meetings: 1 to 4 times per year    Marital Status: Widowed  Intimate Partner Violence: Not At Risk (11/26/2023)   Humiliation, Afraid, Rape, and Kick questionnaire    Fear of Current or Ex-Partner: No    Emotionally Abused: No    Physically Abused: No    Sexually Abused: No    Family History  Problem Relation Age of Onset   Hypertension Mother    Stroke Mother    Prostate cancer Father    Kidney Stones Father    Diabetes Brother    Hypertension Brother    Breast cancer Maternal Aunt    Kidney disease Neg Hx    Allergies  Allergen Reactions   Librium [Chlordiazepoxide] Shortness Of Breath   Reglan [Metoclopramide] Hives and Other (See Comments)    hallucinations    Vanilla Shortness Of Breath    Pt reports allergy to vanilla extract only   Aspirin Hives   Nsaids Rash    Rash/flares asthma issues.   Azithromycin  Other (See Comments)    Unsure of what the reaction was.   Buprenorphine Hcl Other (See Comments)    Unsure of what the reaction was.    Flexeril  [Cyclobenzaprine ]     hallucinations   Morphine Other (See Comments)    Unsure of reaction   Sulfa Antibiotics  Other reaction(s): Unknown   Tolmetin     Other Reaction:  Allergy   Amlodipine  Besylate Itching and Rash    arms, stomach and forehead   Iron  Nausea And Vomiting   I? Current Outpatient Medications  Medication Sig Dispense Refill   Accu-Chek Softclix Lancets lancets USE TO TEST BLOOD SUGAR EVERY DAY AS NEEDED FOR type 2 diabetes 200 each 1   acetaminophen  (TYLENOL ) 500 MG tablet Take 2 tablets (1,000 mg total) by mouth every 6 (six) hours as needed. 100 tablet 2   alendronate  (FOSAMAX ) 70 MG tablet TAKE 1 TABLET BY MOUTH WEEKLY  TAKE WITH A FULL GLASS OF WATER   ON AN EMPTY STOMACH 12 tablet 3   apixaban  (ELIQUIS ) 5 MG TABS tablet Take 1 tablet (5 mg total) by mouth 2 (two) times daily. 180 tablet 3   Blood Glucose Monitoring Suppl (ACCU-CHEK GUIDE ME) w/Device KIT Use device to check glucose daily and as need for prediabetes R73.03 1 kit 0   busPIRone  (BUSPAR ) 10 MG tablet Take 1 tablet (10 mg total) by mouth 2 (two) times daily. 180 tablet 3   clonazePAM  (KLONOPIN ) 1 MG tablet Take 0.5-1 tablets (0.5-1 mg total) by mouth 2 (two) times daily as needed for anxiety. 60 tablet 0   COMBIVENT  RESPIMAT 20-100 MCG/ACT AERS respimat 1 puff every 6 (six) hours.     diclofenac  Sodium (VOLTAREN ) 1 % GEL Apply 4 g topically 4 (four) times daily. 350 g 1   DULoxetine  (CYMBALTA ) 60 MG capsule Take 1 capsule (60 mg total) by mouth daily. 90 capsule 3   Dupilumab  (DUPIXENT ) 300 MG/2ML SOAJ Inject 300 mg into the skin every 14 (fourteen) days. ** Schedule appointment for further refills. ** 4 mL 1   fluconazole  (DIFLUCAN ) 150 MG tablet Take 1 tab po once  and may repeat in 3 days if symptoms persist 3 tablet 0   fluticasone -salmeterol (ADVAIR) 250-50 MCG/ACT AEPB INHALE ONE (1) PUFF BY MOUTH IN THE MORNING AND AT BEDTIME 60 each 11   gabapentin  (NEURONTIN ) 300 MG capsule Take 1 capsule (300 mg total) by mouth 2 (two) times daily. 180 capsule 3   glucose blood (ACCU-CHEK GUIDE TEST) test strip TEST BLOOD SUGAR ONCE DAILY AND AS NEEDED FOR type 2 diabetes 200 strip 1    HYDROcodone -acetaminophen  (NORCO/VICODIN) 5-325 MG tablet Take 1 tablet by mouth 2 (two) times daily as needed for severe pain (pain score 7-10). 45 tablet 0   ipratropium-albuterol  (DUONEB) 0.5-2.5 (3) MG/3ML SOLN Take 3 mLs by nebulization every 6 (six) hours as needed.     lidocaine  (LIDODERM ) 5 % PLACE 1 PATCH ONTO THE AFFECTED AREA OF THE NECK DAILY, REMOVE AND DISCARD WITHIN 12 HOURS OR AS DIRECTED 30 patch 11   methocarbamol  (ROBAXIN ) 750 MG tablet Take 1 tablet (750 mg total) by mouth every 8 (eight) hours as needed for muscle spasms. 90 tablet 5   montelukast  (SINGULAIR ) 10 MG tablet TAKE 1 TABLET(10 MG) BY MOUTH AT BEDTIME 90 tablet 3   ondansetron  (ZOFRAN ) 4 MG tablet TAKE 1 TABLET BY MOUTH TWICE DAILY AS NEEDED 45 tablet 1   oxybutynin  (DITROPAN -XL) 10 MG 24 hr tablet TAKE 1 TABLET(10 MG) BY MOUTH AT BEDTIME 30 tablet 5   pantoprazole  (PROTONIX ) 40 MG tablet Take 1 tablet (40 mg total) by mouth 2 (two) times daily. 180 tablet 1   polyethylene glycol (MIRALAX  / GLYCOLAX ) 17 g packet Take 17 g by mouth 2 (two) times daily. (Patient taking differently: Take 17 g  by mouth 2 (two) times daily as needed for mild constipation, moderate constipation or severe constipation.)     potassium chloride  (KLOR-CON  10) 10 MEQ tablet Take 1 tablet (10 mEq total) by mouth daily. 90 tablet 1   rOPINIRole  (REQUIP ) 1 MG tablet TAKE 1 TABLET(1 MG) BY MOUTH AT BEDTIME 90 tablet 1   SUMAtriptan  (IMITREX ) 100 MG tablet Take 1 tablet (100 mg total) by mouth every 2 (two) hours as needed (for migraine headaches.). May repeat in 1 hours if headache persists or recurs. 10 tablet 3   traZODone  (DESYREL ) 150 MG tablet TAKE 1 TABLET(150 MG) BY MOUTH AT BEDTIME 90 tablet 3   No current facility-administered medications for this visit.     Abtx:  Anti-infectives (From admission, onward)    None       REVIEW OF SYSTEMS:  Const: negative fever, negative chills,  weight loss- neurosurgeon wants her to gain weight  upto 130 Eyes: negative diplopia or visual changes, negative eye pain ENT: negative coryza, negative sore throat Resp: negative cough, hemoptysis, dyspnea Cards: negative for chest pain, palpitations, lower extremity edema GU: negative for frequency, dysuria and hematuria GI: Negative for abdominal pain, diarrhea, bleeding, constipation Skin: negative for rash and pruritus Heme: negative for easy bruising and gum/nose bleeding FD:wzrx pain much better Psych:  anxiety, depression  Endocrine: negative for thyroid , diabetes Allergy/Immunology- as above Objective:  VITALS:  There were no vitals taken for this visit.  PHYSICAL EXAM:  General: Alert, cooperative, no distress, weak Pertinent Labs No recent ones    ? Impression/Recommendation  c3-c6 laminectomy and posterior fusion on 03/02/23? Complicated by MSSA bacteremia  due to cervical spine surgical site infection 04/16/23 MRI showed superficial and deep abscess- underwent I/D on 04/17/23 and culture was MSSA Repeat blood culture from 1/26  and 2/2 were neg Pt was to have  IV cefazoin for total of 6 weeks until 3/17, but on 2/25 she was discharged from SNF due to insurance- So she received Iv dalbavance weekly dose ( 2/25, 1500mg , 3/4 1 grm, 3./11 - 1gm. Because of hardware added rifampin  300mg  Po BID but she was not able to to tolerate the rifampin  as she had severe nausea and poor appetite.  Hence it was discontinued.  On 06/08/23 she was  started on  PO cefadroxil  1 gram Q 12 -with a plan to give for 8 weeks because of hardware  CT 07/29/23 done on 07/29/23 showed Loosening of the bilateral C3 and C6 laminar screws, also the right C4 screw So it was decided to continue cefadroxil   500mg  Po BID indefinitely   On 7/22 Sed rate was 63 and CRP 3.2 She is currently on 500mg  BID cefadroxil  - plan is to give indefinitely   L4 and L4-L5 discitis and osteomyelitis -treated  underwent aspiration of the collection on 04/25/23  but culture neg-  Lumbar  pain is not an issue anymore Was c/o severe diarrhea and cefadroxil  was stopped 11/10/23 NO more diarrhea she says Cdiff neg  In aug 2025 Weakness, dizziness, weight loss BP on the lower side 104/54 HR 80- r/o dehydration serum cortisol  was low 2.4  in Feb 2024 I checked cortisol and acth  on 11/17/23 - ACTH   was very low- 1.6 cortisol was 7.8 Will confer with her PCP regarding the significance and management and whether need referral to endocrine       Anemia    Hiatus hernia h/o fundoplication Dysphagia has had esophageal dilatation Nov 2024 Biopsy no evidence  of metaplasia ? Anxiety  on meds ? H/o Hypokalemia- intermittent   Asthma/COPD- followed by pulmonologist and  on dupilumab   Will need ESR/CRP  ________________________________________________ Follow up 5 months  Total time spent on this video call 25 min

## 2023-12-29 ENCOUNTER — Ambulatory Visit: Attending: Cardiology

## 2023-12-30 ENCOUNTER — Ambulatory Visit: Attending: Cardiology | Admitting: Cardiology

## 2023-12-30 ENCOUNTER — Telehealth: Payer: Self-pay | Admitting: Cardiology

## 2023-12-30 ENCOUNTER — Encounter: Payer: Self-pay | Admitting: Physician Assistant

## 2023-12-30 ENCOUNTER — Ambulatory Visit: Admitting: Physician Assistant

## 2023-12-30 VITALS — Wt 107.0 lb

## 2023-12-30 DIAGNOSIS — M810 Age-related osteoporosis without current pathological fracture: Secondary | ICD-10-CM

## 2023-12-30 NOTE — Telephone Encounter (Signed)
 Need to reschedule 12/30/23 appt. Mailbox is full needs to be after Echo

## 2023-12-30 NOTE — Progress Notes (Signed)
 Office Visit Note   Patient: Summer Murphy           Date of Birth: 12-14-1955           MRN: [id number] Visit Date: 12/30/2023              Requested by: Liana Fish, NP [address] [place],  [place] [address] PCP: Liana Fish, NP  Chief Complaint  Patient presents with   Osteoporosis    Bone Density Review      HPI: Patient is a pleasant 68 year old woman who have seen in the past.  She was referred by Dr. Claudene.  But her last visit she does have a diagnosis of osteoporosis with her current bone density scan.  Unfortunately she has significant concerns with regards to nutritional status and ability to take vitamin D  and calcium and adequate amounts.  Plan: t.  She was referred by Dr. Claudene.  She had failed cervical surgery with infection.  She does have osteoporosis.  She is on Fosamax .  Her most recent last 2 show some hypocalcemia at 7.9.  Ideally she should be started on a bone builder.  Even Prolia or even the Fosamax  will require her to keep her calcium at a normal level.  She is very concerned about her nutrition consult as they would like her to be 830 pounds and which she does not want to do.  To be at least in a healthy weight range she has to be in 120 pounds.  I am concerned that she would have difficulty maintaining an adequate calcium levels for treatment with Prolia or Evenity.  I have encouraged her that she needs to follow the recommendations of the nutritionist.  I have given her once again information on different calcium supplements..  She also needs to follow-up with her primary care with regards to her lab values.  I can see her back in 2 months and I am concerned of her high risk of fracture however she is at a high risk of hypocalcemia with the medications that are being considered even with the Fosamax  Visit Diagnoses: No diagnosis found.  Follow-Up Instructions: Return in about 2 months (around 02/29/2024).   Ortho Exam  Patient is alert,  oriented, no adenopathy, well-dressed, normal affect, normal respiratory effort.     Imaging: No results found. No images are attached to the encounter.  Labs: Lab Results  Component Value Date   HGBA1C 6.2 (H) 11/27/2023   HGBA1C 6.1 (H) 11/26/2023   HGBA1C 5.8 (H) 04/16/2023   ESRSEDRATE 63 (H) 11/17/2023   ESRSEDRATE 63 (H) 10/12/2023   ESRSEDRATE 44 (H) 06/08/2023   CRP 5.4 (H) 11/17/2023   CRP 3.2 (H) 10/12/2023   CRP 0.8 06/08/2023   LABURIC 3.5 04/03/2019   REPTSTATUS 04/30/2023 FINAL 04/25/2023   REPTSTATUS 04/30/2023 FINAL 04/25/2023   GRAMSTAIN  04/25/2023    ABUNDANT WBC PRESENT,BOTH PMN AND MONONUCLEAR NO ORGANISMS SEEN    GRAMSTAIN  04/25/2023    FEW WBC PRESENT,BOTH PMN AND MONONUCLEAR NO ORGANISMS SEEN    CULT  04/25/2023    No growth aerobically or anaerobically. Performed at [facility], [address] [place], [place] [address]    CULT  04/25/2023    No growth aerobically or anaerobically. Performed at [facility], [address], [place], [place] [address]    [facility] STAPHYLOCOCCUS AUREUS 04/17/2023     Lab Results  Component Value Date   ALBUMIN 2.5 (L) 11/27/2023  ALBUMIN 2.7 (L) 11/26/2023   ALBUMIN 2.9 (L) 11/11/2023    Lab Results  Component Value Date   MG 1.9 11/30/2023   MG 1.7 11/29/2023   MG 1.9 11/28/2023   Lab Results  Component Value Date   VD25OH 34.34 10/12/2023   VD25OH 38.1 08/29/2021    No results found for: PREALBUMIN    Latest Ref Rng & Units 11/30/2023    6:49 AM 11/29/2023    4:15 AM 11/28/2023    6:49 AM  CBC EXTENDED  WBC 4.0 - 10.5 K/uL  10.4  13.9   RBC 3.87 - 5.11 MIL/uL  2.98  2.92   Hemoglobin 12.0 - 15.0 g/dL 9.2  7.7  7.4   HCT 63.9 - 46.0 %  24.4  23.7   Platelets 150 - 400 K/uL  435  461      Body mass index is 21.25 kg/m.  Orders:  No orders of the defined types were placed in this encounter.  No orders of the defined types were placed in this encounter.     Procedures: No procedures performed  Clinical Data: No additional findings.  ROS:  All other systems negative, except as noted in the HPI. Review of Systems  Objective: Vital Signs: Wt 107 lb (48.5 kg)   BMI 21.25 kg/m   Specialty Comments:  No specialty comments available.  PMFS History: Patient Active Problem List   Diagnosis Date Noted   Internal hernia 11/27/2023   Hyponatremia 11/26/2023   Small bowel obstruction (HCC) 11/26/2023   Hypokalemia 10/29/2023   Age-related osteoporosis without current pathological fracture 09/20/2023   Vertebral osteomyelitis (HCC) 04/26/2023   Lumbar discitis 04/26/2023   Infection of cervical spine (HCC) 04/26/2023   Surgical site infection 04/19/2023   Falls 04/16/2023   Chronic pain syndrome    S/P cervical spinal fusion 04/12/2023   Cervical myelopathy (HCC) 03/02/2023   T2DM (type 2 diabetes mellitus) (HCC) 02/28/2023   Stenosis of cervical spine with myelopathy (HCC) 02/04/2023   Cervical radicular pain (left severe) 12/10/2022   Foraminal stenosis of cervical region 11/25/2022   Cervical disc disorder with radiculopathy of cervical region 11/25/2022   Cervicogenic headache 11/25/2022   Chronic asthmatic bronchitis (HCC) 06/12/2022   Anxiety and depression 06/02/2022   Essential hypertension 06/02/2022   Asthma 06/01/2022   DOE (dyspnea on exertion) 05/13/2022   Hypocalcemia 07/31/2021   Esophageal dysphagia    Benign esophageal stricture    S/P hardware removal 11/14/2020   Iron  deficiency anemia 10/18/2020   S/P revision of total knee, right 11/14/2019   Seasonal allergic rhinitis due to pollen 09/20/2019   Callus of foot 08/13/2019   Restless leg 04/30/2019   Diastolic dysfunction 12/14/2018   Acid reflux 12/14/2018   Recurrent sinusitis 08/15/2018   Migraine 04/22/2018   Nausea 04/22/2018   Polyneuropathy associated with underlying disease 04/22/2018   Midline low back pain without sciatica 04/22/2018    Allergic rhinitis 07/01/2017   Moderate asthma without complication 01/20/2016   COPD (chronic obstructive pulmonary disease) (HCC) 09/10/2014   Calculi, ureter 04/26/2013   Corneal graft malfunction 08/17/2012   Calculus of kidney 04/26/2012   Urge incontinence 04/26/2012   Diaphragmatic hernia 10/27/2010   Barrett esophagus 07/07/2010   Past Medical History:  Diagnosis Date   Acid reflux    Anemia    Anxiety    a.) buspirone  BID + BZO PRN (clonazepam )   Arthritis    Aspiration pneumonia (HCC) 06/02/2022   Asthma    CAP (  community acquired pneumonia) 04/24/2022   Cervical disc disorder with radiculopathy of cervical region    Chronic pain syndrome    a.) on COT as prescribed by pain mangement   Chronic, continuous use of opioids    a.) followed by pain mangement   COPD (chronic obstructive pulmonary disease) (HCC)    DOE (dyspnea on exertion)    Hematuria    Hiatal hernia    History of kidney stones    Hypertension    Hypertensive urgency 05/12/2022   Hypoglycemia    Insomnia    a.) uses Trazodone  PRN   LBBB (left bundle branch block)    Migraine    MSSA bacteremia 04/19/2023   Murmur    Osteopenia    Palpitations    Pre-diabetes    Restless leg    a.) on ropinirole    Sepsis (HCC) 05/12/2022   Stenosis of cervical spine with myelopathy (HCC)    T2DM (type 2 diabetes mellitus) (HCC)     Family History  Problem Relation Age of Onset   Hypertension Mother    Stroke Mother    Prostate cancer Father    Kidney Stones Father    Diabetes Brother    Hypertension Brother    Breast cancer Maternal Aunt    Kidney disease Neg Hx     Past Surgical History:  Procedure Laterality Date   ABDOMINAL HYSTERECTOMY     AMPUTATION TOE Left 07/31/2016   Procedure: AMPUTATION TOE/MPJ 2nd toe;  Surgeon: Neill Boas, DPM;  Location: [facility];  Service: Podiatry;  Laterality: Left;   APPENDECTOMY  1990   BACK SURGERY     low back   BREAST SURGERY     bilateral breast  reduction   CARDIAC ELECTROPHYSIOLOGY STUDY AND ABLATION     CERVICAL WOUND DEBRIDEMENT N/A 04/17/2023   Procedure: CERVICAL WOUND DEBRIDEMENT;  Surgeon: Bluford Standing, MD;  Location: [facility];  Service: Neurosurgery;  Laterality: N/A;   CHOLECYSTECTOMY  1990   COLONOSCOPY WITH PROPOFOL  N/A 01/04/2017   Procedure: COLONOSCOPY WITH PROPOFOL ;  Surgeon: Viktoria Lamar DASEN, MD;  Location: [facility] ENDOSCOPY;  Service: Endoscopy;  Laterality: N/A;   CORNEAL TRANSPLANT     ESOPHAGOGASTRODUODENOSCOPY N/A 04/09/2021   Procedure: ESOPHAGOGASTRODUODENOSCOPY (EGD);  Surgeon: Unk Corinn Skiff, MD;  Location: [facility] ENDOSCOPY;  Service: Gastroenterology;  Laterality: N/A;   ESOPHAGOGASTRODUODENOSCOPY (EGD) WITH PROPOFOL  N/A 01/04/2017   Procedure: ESOPHAGOGASTRODUODENOSCOPY (EGD) WITH PROPOFOL ;  Surgeon: Viktoria Lamar DASEN, MD;  Location: [facility] ENDOSCOPY;  Service: Endoscopy;  Laterality: N/A;   EXCISION BONE CYST Left 07/31/2016   Procedure: EXCISION BONE CYST/exostectomy 28124/left 2nd;  Surgeon: Neill Boas, DPM;  Location: [facility];  Service: Podiatry;  Laterality: Left;   EXTRACORPOREAL SHOCK WAVE LITHOTRIPSY Left 09/12/2015   Procedure: EXTRACORPOREAL SHOCK WAVE LITHOTRIPSY (ESWL);  Surgeon: Rosina Riis, MD;  Location: [facility];  Service: Urology;  Laterality: Left;   FRACTURE SURGERY     left foot   GUM SURGERY Left    gum infection   HARDWARE REMOVAL Left 11/14/2020   Procedure: HARDWARE REMOVAL;  Surgeon: Kathlynn Sharper, MD;  Location: [facility];  Service: Orthopedics;  Laterality: Left;   HH repair     Fundoplication   JOINT REPLACEMENT Bilateral 2013,2014   total knees   LAPAROSCOPIC HYSTERECTOMY     LAPAROTOMY N/A 11/27/2023   Procedure: LAPAROTOMY, EXPLORATORY lysis of adhesions;  Surgeon: Lane Shope, MD;  Location: [facility];  Service: General;  Laterality: N/A;   LITHOTRIPSY     periprosthetic supracondylar fracture  of left femur  02/16/2020   [facility]   POSTERIOR CERVICAL  FUSION/FORAMINOTOMY N/A 03/02/2023   Procedure: C3-6 POSTERIOR CERVICAL LAMINECTOMY AND FUSION;  Surgeon: Claudene Penne ORN, MD;  Location: [facility];  Service: Neurosurgery;  Laterality: N/A;   TONSILLECTOMY     TOTAL KNEE REVISION Right 11/14/2019   Procedure: Revision patella and tibial polyethylene;  Surgeon: Kathlynn Sharper, MD;  Location: [facility];  Service: Orthopedics;  Laterality: Right;   URETEROSCOPY     Social History   Occupational History   Not on file  Tobacco Use   Smoking status: Never    Passive exposure: Past   Smokeless tobacco: Never  Vaping Use   Vaping status: Never Used  Substance and Sexual Activity   Alcohol use: No   Drug use: No   Sexual activity: Not Currently

## 2024-01-03 ENCOUNTER — Encounter: Payer: Self-pay | Admitting: Nurse Practitioner

## 2024-01-03 ENCOUNTER — Ambulatory Visit (INDEPENDENT_AMBULATORY_CARE_PROVIDER_SITE_OTHER): Admitting: Nurse Practitioner

## 2024-01-03 ENCOUNTER — Ambulatory Visit: Admitting: Nurse Practitioner

## 2024-01-03 ENCOUNTER — Telehealth: Payer: Self-pay | Admitting: Nurse Practitioner

## 2024-01-03 VITALS — BP 116/62 | HR 100 | Temp 96.9°F | Resp 16 | Ht 59.5 in | Wt 108.0 lb

## 2024-01-03 DIAGNOSIS — B370 Candidal stomatitis: Secondary | ICD-10-CM

## 2024-01-03 MED ORDER — FLUCONAZOLE 150 MG PO TABS: 150.0000 mg | ORAL_TABLET | Freq: Every day | ORAL | 0 refills | Status: AC

## 2024-01-03 NOTE — Telephone Encounter (Signed)
 12/10/23 therapy order signed. Faxed back to [place]; [phone]. Scanned-Toni

## 2024-01-03 NOTE — Progress Notes (Signed)
 [facility] [address] [place], [place] [address]  Internal MEDICINE  Office Visit Note  Patient Name: Summer Murphy  [id number]  [id number]  Date of Service: 01/03/2024  Chief Complaint  Patient presents with   Acute Visit    Thrush and lips dry    HPI Summer Murphy presents for a follow-up visit for dry mouth and thrush  Oral thrush and dry mouth -- has been using her maintenance inhaler and not rinsing her mouth out afterward which increases the risk of thrush. She is on other medications which can cause dry mouth as a side effect which also increases the risk of thrush.     Current Medication: Outpatient Encounter Medications as of 01/03/2024  Medication Sig Note   fluconazole  (DIFLUCAN ) 150 MG tablet Take 1 tablet (150 mg total) by mouth daily for 14 doses. May take an additional dose after 3 days if still symptomatic.    Accu-Chek Softclix Lancets lancets USE TO TEST BLOOD SUGAR EVERY DAY AS NEEDED FOR type 2 diabetes    acetaminophen  (TYLENOL ) 500 MG tablet Take 2 tablets (1,000 mg total) by mouth every 6 (six) hours as needed. 04/22/2023: prn   alendronate  (FOSAMAX ) 70 MG tablet TAKE 1 TABLET BY MOUTH WEEKLY  TAKE WITH A FULL GLASS OF WATER   ON AN EMPTY STOMACH    apixaban  (ELIQUIS ) 5 MG TABS tablet Take 1 tablet (5 mg total) by mouth 2 (two) times daily.    Blood Glucose Monitoring Suppl (ACCU-CHEK GUIDE ME) w/Device KIT Use device to check glucose daily and as need for prediabetes R73.03    busPIRone  (BUSPAR ) 10 MG tablet Take 1 tablet (10 mg total) by mouth 2 (two) times daily.    cefadroxil  (DURICEF) 500 MG capsule Take 1 capsule (500 mg total) by mouth 2 (two) times daily.    clonazePAM  (KLONOPIN ) 1 MG tablet Take 0.5-1 tablets (0.5-1 mg total) by mouth 2 (two) times daily as needed for anxiety.    COMBIVENT  RESPIMAT 20-100 MCG/ACT AERS respimat 1 puff every 6 (six) hours.    diclofenac  Sodium (VOLTAREN ) 1 % GEL Apply 4 g topically 4 (four) times daily.     DULoxetine  (CYMBALTA ) 60 MG capsule Take 1 capsule (60 mg total) by mouth daily.    Dupilumab  (DUPIXENT ) 300 MG/2ML SOAJ Inject 300 mg into the skin every 14 (fourteen) days. ** Schedule appointment for further refills. **    fluticasone -salmeterol (ADVAIR) 250-50 MCG/ACT AEPB INHALE ONE (1) PUFF BY MOUTH IN THE MORNING AND AT BEDTIME    gabapentin  (NEURONTIN ) 300 MG capsule Take 1 capsule (300 mg total) by mouth 2 (two) times daily.    glucose blood (ACCU-CHEK GUIDE TEST) test strip TEST BLOOD SUGAR ONCE DAILY AND AS NEEDED FOR type 2 diabetes    HYDROcodone -acetaminophen  (NORCO/VICODIN) 5-325 MG tablet Take 1 tablet by mouth 2 (two) times daily as needed for severe pain (pain score 7-10).    ipratropium-albuterol  (DUONEB) 0.5-2.5 (3) MG/3ML SOLN Take 3 mLs by nebulization every 6 (six) hours as needed.    lidocaine  (LIDODERM ) 5 % PLACE 1 PATCH ONTO THE AFFECTED AREA OF THE NECK DAILY, REMOVE AND DISCARD WITHIN 12 HOURS OR AS DIRECTED    methocarbamol  (ROBAXIN ) 750 MG tablet Take 1 tablet (750 mg total) by mouth every 8 (eight) hours as needed for muscle spasms.    montelukast  (SINGULAIR ) 10 MG tablet TAKE 1 TABLET(10 MG) BY MOUTH AT BEDTIME    ondansetron  (ZOFRAN ) 4 MG tablet TAKE 1 TABLET BY MOUTH  TWICE DAILY AS NEEDED    oxybutynin  (DITROPAN -XL) 10 MG 24 hr tablet TAKE 1 TABLET(10 MG) BY MOUTH AT BEDTIME    pantoprazole  (PROTONIX ) 40 MG tablet Take 1 tablet (40 mg total) by mouth 2 (two) times daily.    polyethylene glycol (MIRALAX  / GLYCOLAX ) 17 g packet Take 17 g by mouth 2 (two) times daily. (Patient taking differently: Take 17 g by mouth 2 (two) times daily as needed for mild constipation, moderate constipation or severe constipation.)    potassium chloride  (KLOR-CON  10) 10 MEQ tablet Take 1 tablet (10 mEq total) by mouth daily.    rOPINIRole  (REQUIP ) 1 MG tablet TAKE 1 TABLET(1 MG) BY MOUTH AT BEDTIME    SUMAtriptan  (IMITREX ) 100 MG tablet Take 1 tablet (100 mg total) by mouth every 2 (two)  hours as needed (for migraine headaches.). May repeat in 1 hours if headache persists or recurs.    traZODone  (DESYREL ) 150 MG tablet TAKE 1 TABLET(150 MG) BY MOUTH AT BEDTIME    [DISCONTINUED] fluconazole  (DIFLUCAN ) 150 MG tablet Take 1 tab po once  and may repeat in 3 days if symptoms persist    No facility-administered encounter medications on file as of 01/03/2024.    Surgical History: Past Surgical History:  Procedure Laterality Date   ABDOMINAL HYSTERECTOMY     AMPUTATION TOE Left 07/31/2016   Procedure: AMPUTATION TOE/MPJ 2nd toe;  Surgeon: Neill Boas, DPM;  Location: [facility];  Service: Podiatry;  Laterality: Left;   APPENDECTOMY  1990   BACK SURGERY     low back   BREAST SURGERY     bilateral breast reduction   CARDIAC ELECTROPHYSIOLOGY STUDY AND ABLATION     CERVICAL WOUND DEBRIDEMENT N/A 04/17/2023   Procedure: CERVICAL WOUND DEBRIDEMENT;  Surgeon: Bluford Standing, MD;  Location: [facility];  Service: Neurosurgery;  Laterality: N/A;   CHOLECYSTECTOMY  1990   COLONOSCOPY WITH PROPOFOL  N/A 01/04/2017   Procedure: COLONOSCOPY WITH PROPOFOL ;  Surgeon: Viktoria Lamar DASEN, MD;  Location: [facility] ENDOSCOPY;  Service: Endoscopy;  Laterality: N/A;   CORNEAL TRANSPLANT     ESOPHAGOGASTRODUODENOSCOPY N/A 04/09/2021   Procedure: ESOPHAGOGASTRODUODENOSCOPY (EGD);  Surgeon: Unk Corinn Skiff, MD;  Location: [facility] ENDOSCOPY;  Service: Gastroenterology;  Laterality: N/A;   ESOPHAGOGASTRODUODENOSCOPY (EGD) WITH PROPOFOL  N/A 01/04/2017   Procedure: ESOPHAGOGASTRODUODENOSCOPY (EGD) WITH PROPOFOL ;  Surgeon: Viktoria Lamar DASEN, MD;  Location: [facility] ENDOSCOPY;  Service: Endoscopy;  Laterality: N/A;   EXCISION BONE CYST Left 07/31/2016   Procedure: EXCISION BONE CYST/exostectomy 28124/left 2nd;  Surgeon: Neill Boas, DPM;  Location: [facility];  Service: Podiatry;  Laterality: Left;   EXTRACORPOREAL SHOCK WAVE LITHOTRIPSY Left 09/12/2015   Procedure: EXTRACORPOREAL SHOCK WAVE LITHOTRIPSY (ESWL);  Surgeon:  Rosina Riis, MD;  Location: [facility];  Service: Urology;  Laterality: Left;   FRACTURE SURGERY     left foot   GUM SURGERY Left    gum infection   HARDWARE REMOVAL Left 11/14/2020   Procedure: HARDWARE REMOVAL;  Surgeon: Kathlynn Sharper, MD;  Location: [facility];  Service: Orthopedics;  Laterality: Left;   HH repair     Fundoplication   JOINT REPLACEMENT Bilateral 2013,2014   total knees   LAPAROSCOPIC HYSTERECTOMY     LAPAROTOMY N/A 11/27/2023   Procedure: LAPAROTOMY, EXPLORATORY lysis of adhesions;  Surgeon: Lane Shope, MD;  Location: [facility];  Service: General;  Laterality: N/A;   LITHOTRIPSY     periprosthetic supracondylar fracture of left femur  02/16/2020   [facility]   POSTERIOR CERVICAL FUSION/FORAMINOTOMY N/A 03/02/2023  Procedure: C3-6 POSTERIOR CERVICAL LAMINECTOMY AND FUSION;  Surgeon: Claudene Penne ORN, MD;  Location: [facility];  Service: Neurosurgery;  Laterality: N/A;   TONSILLECTOMY     TOTAL KNEE REVISION Right 11/14/2019   Procedure: Revision patella and tibial polyethylene;  Surgeon: Kathlynn Sharper, MD;  Location: [facility];  Service: Orthopedics;  Laterality: Right;   URETEROSCOPY      Medical History: Past Medical History:  Diagnosis Date   Acid reflux    Anemia    Anxiety    a.) buspirone  BID + BZO PRN (clonazepam )   Arthritis    Aspiration pneumonia (HCC) 06/02/2022   Asthma    CAP (community acquired pneumonia) 04/24/2022   Cervical disc disorder with radiculopathy of cervical region    Chronic pain syndrome    a.) on COT as prescribed by pain mangement   Chronic, continuous use of opioids    a.) followed by pain mangement   COPD (chronic obstructive pulmonary disease) (HCC)    DOE (dyspnea on exertion)    Hematuria    Hiatal hernia    History of kidney stones    Hypertension    Hypertensive urgency 05/12/2022   Hypoglycemia    Insomnia    a.) uses Trazodone  PRN   LBBB (left bundle branch block)    Migraine    MSSA bacteremia  04/19/2023   Murmur    Osteopenia    Palpitations    Pre-diabetes    Restless leg    a.) on ropinirole    Sepsis (HCC) 05/12/2022   Stenosis of cervical spine with myelopathy (HCC)    T2DM (type 2 diabetes mellitus) (HCC)     Family History: Family History  Problem Relation Age of Onset   Hypertension Mother    Stroke Mother    Prostate cancer Father    Kidney Stones Father    Diabetes Brother    Hypertension Brother    Breast cancer Maternal Aunt    Kidney disease Neg Hx     Social History   Socioeconomic History   Marital status: Widowed    Spouse name: Not on file   Number of children: Not on file   Years of education: Not on file   Highest education level: Not on file  Occupational History   Not on file  Tobacco Use   Smoking status: Never    Passive exposure: Past   Smokeless tobacco: Never  Vaping Use   Vaping status: Never Used  Substance and Sexual Activity   Alcohol use: No   Drug use: No   Sexual activity: Not Currently  Other Topics Concern   Not on file  Social History Narrative   Lives at home alone. Relatives are the support person.    Social Drivers of Corporate Investment Banker Strain: Low Risk  (12/20/2023)   Received [facility]   Overall Financial Resource Strain (CARDIA)    Difficulty of Paying Living Expenses: Not hard at all  Food Insecurity: No Food Insecurity (12/20/2023)   Received from [facility]   Hunger Vital Sign    Within the past 12 months, you worried that your food would run out before you got the money to buy more.: Never true    Within the past 12 months, the food you bought just didn't last and you didn't have money to get more.: Never true  Transportation Needs: No Transportation Needs (12/20/2023)   Received from [facility]   PRAPARE -  Transportation    In the past 12 months, has lack of transportation kept you from medical appointments or from getting  medications?: No    Lack of Transportation (Non-Medical): No  Physical Activity: Not on file  Stress: Not on file  Social Connections: Moderately Integrated (11/26/2023)   Social Connection and Isolation Panel    Frequency of Communication with Friends and Family: More than three times a week    Frequency of Social Gatherings with Friends and Family: More than three times a week    Attends Religious Services: 1 to 4 times per year    Active Member of [employer] or Organizations: Yes    Attends Banker Meetings: 1 to 4 times per year    Marital Status: Widowed  Intimate Partner Violence: Not At Risk (11/26/2023)   Humiliation, Afraid, Rape, and Kick questionnaire    Fear of Current or Ex-Partner: No    Emotionally Abused: No    Physically Abused: No    Sexually Abused: No      Review of Systems  Constitutional:  Positive for fatigue.  HENT:  Positive for mouth sores (white coating on tongue, mouth pain).   Respiratory:  Positive for shortness of breath (intermittent). Negative for cough, chest tightness and wheezing.   Cardiovascular: Negative.  Negative for chest pain and palpitations.  Gastrointestinal: Negative.   Musculoskeletal:  Positive for arthralgias, back pain and neck pain.  Psychiatric/Behavioral:  Negative for self-injury and suicidal ideas. The patient is nervous/anxious.     Vital Signs: BP 116/62   Pulse 100   Temp (!) 96.9 F (36.1 C)   Resp 16   Ht 4' 11.5 (1.511 m)   Wt 108 lb (49 kg)   SpO2 93%   BMI 21.45 kg/m    Physical Exam Vitals reviewed.  Constitutional:      General: She is not in acute distress.    Appearance: Normal appearance. She is normal weight. She is not ill-appearing.  HENT:     Head: Normocephalic and atraumatic.     Mouth/Throat:     Mouth: Mucous membranes are dry. Oral lesions present.     Tongue: Lesions (white coating) present.     Pharynx: Pharyngeal swelling and posterior oropharyngeal erythema present.  Eyes:      Pupils: Pupils are equal, round, and reactive to light.  Cardiovascular:     Rate and Rhythm: Normal rate and regular rhythm.  Pulmonary:     Effort: Pulmonary effort is normal. No respiratory distress.  Neurological:     Mental Status: She is alert and oriented to person, place, and time.  Psychiatric:        Mood and Affect: Mood normal.        Behavior: Behavior normal.        Assessment/Plan: 1. Oral thrush (Primary) Take fluconazole  as prescribed.  - fluconazole  (DIFLUCAN ) 150 MG tablet; Take 1 tablet (150 mg total) by mouth daily for 14 doses. May take an additional dose after 3 days if still symptomatic.  Dispense: 14 tablet; Refill: 0   General Counseling: Summer Murphy verbalizes understanding of the findings of todays visit and agrees with plan of treatment. I have discussed any further diagnostic evaluation that may be needed or ordered today. We also reviewed her medications today. she has been encouraged to call the office with any questions or concerns that should arise related to todays visit.    No orders of the defined types were placed in this encounter.  Meds ordered this encounter  Medications   fluconazole  (DIFLUCAN ) 150 MG tablet    Sig: Take 1 tablet (150 mg total) by mouth daily for 14 doses. May take an additional dose after 3 days if still symptomatic.    Dispense:  14 tablet    Refill:  0    Fill new script today.    Return if symptoms worsen or fail to improve.   Total time spent:20 Minutes Time spent includes review of chart, medications, test results, and follow up plan with the patient.   [place] Controlled Substance Database was reviewed by me.  This patient was seen by Mardy Maxin, FNP-C in collaboration with Dr. Sigrid Bathe as a part of collaborative care agreement.   Nobie Alleyne R. Maxin, MSN, FNP-C Internal medicine

## 2024-01-04 ENCOUNTER — Encounter: Payer: Self-pay | Admitting: Student in an Organized Health Care Education/Training Program

## 2024-01-04 ENCOUNTER — Ambulatory Visit
Attending: Student in an Organized Health Care Education/Training Program | Admitting: Student in an Organized Health Care Education/Training Program

## 2024-01-04 VITALS — BP 104/65 | HR 104 | Temp 98.4°F | Resp 16 | Ht 59.5 in | Wt 108.0 lb

## 2024-01-04 DIAGNOSIS — M25562 Pain in left knee: Secondary | ICD-10-CM | POA: Insufficient documentation

## 2024-01-04 DIAGNOSIS — M1712 Unilateral primary osteoarthritis, left knee: Secondary | ICD-10-CM | POA: Insufficient documentation

## 2024-01-04 DIAGNOSIS — G8929 Other chronic pain: Secondary | ICD-10-CM | POA: Insufficient documentation

## 2024-01-04 NOTE — Progress Notes (Signed)
 Safety precautions to be maintained throughout the outpatient stay will include: orient to surroundings, keep bed in low position, maintain call bell within reach at all times, provide assistance with transfer out of bed and ambulation.

## 2024-01-04 NOTE — Progress Notes (Signed)
 PROVIDER NOTE: Interpretation of information contained herein should be left to medically-trained personnel. Specific patient instructions are provided elsewhere under Patient Instructions section of medical record. This document was created in part using AI and STT-dictation technology, any transcriptional errors that may result from this process are unintentional.  Patient: Summer Murphy  Service: E/M   PCP: Liana Fish, NP  DOB: 1955/12/29  DOS: 01/04/2024  Provider: Wallie Sherry, MD  MRN: [id number]  Delivery: Face-to-face  Specialty: Interventional Pain Management  Type: Established Patient  Setting: Ambulatory outpatient facility  Specialty designation: 09  Referring Prov.: Liana Fish, NP  Location: Outpatient office facility       History of present illness (HPI) [name], a 68 y.o. year old female, is here today because of her Primary osteoarthritis of left knee [M17.12]. Ms. Boutin primary complain today is Knee Pain (left)   Pain Assessment: Severity of Chronic pain is reported as a 9 /10. Location: Knee Left/denies. Onset: More than a month ago. Quality: Shooting, Sharp. Timing: Constant. Modifying factor(s): meds, ice. Vitals:  height is 4' 11.5 (1.511 m) and weight is 108 lb (49 kg). Her temperature is 98.4 F (36.9 C). Her blood pressure is 104/65 and her pulse is 104 (abnormal). Her respiration is 16 and oxygen  saturation is 97%.  BMI: Estimated body mass index is 21.45 kg/m as calculated from the following:   Height as of this encounter: 4' 11.5 (1.511 m).   Weight as of this encounter: 108 lb (49 kg).  Last encounter: 10/26/2023. Last procedure: 11/03/2023.  Reason for encounter:   Discussed the use of AI scribe software for clinical note transcription with the patient, who gave verbal consent to proceed.  History of Present Illness   Summer Murphy is a 68 year old female with a history of bilateral knee replacements who presents with  left knee pain and swelling. She was referred by Dr. Kathlynn for management of pain related to a suspected hairline fracture in the left knee.  She has significant swelling in her left knee, which has been previously replaced. She reports that Dr. Toby suspects she may have a hairline fracture and that a scan is planned.  She experiences severe pain in the left knee, impacting her daily life. She takes oxycodone  two to three times a day, every four hours as needed, due to the severity of the pain.  Her right knee, which has also been replaced, is currently asymptomatic.  She is concerned about pain management as Dr. Kathlynn has reached a limit on prescribing her pain medication, leading to her referral for further pain management options.   Of note the patient previously saw me 11/03/2023 for cervical epidural steroid injection and is doing well after that in regards to her cervical radicular pain.        Effectiveness: Clinically acceptable.  Dayna Pulling, RN  01/04/2024  9:45 AM  Sign when Signing Visit Safety precautions to be maintained throughout the outpatient stay will include: orient to surroundings, keep bed in low position, maintain call bell within reach at all times, provide assistance with transfer out of bed and ambulation.   UDS:  Summary  Date Value Ref Range Status  01/27/2023 Note  Final    Comment:    ==================================================================== Compliance Drug Analysis, Ur ==================================================================== Specimen Alert Not Detected result may be consistent with the time of last use noted for this medication. AS NEEDED. (Hydrocodone ) ==================================================================== Test  Result       Flag       Units  Drug Present and Declared for Prescription Verification   7-aminoclonazepam              468          EXPECTED   ng/mg creat     7-aminoclonazepam is an expected metabolite of clonazepam . Source of    clonazepam  is a scheduled prescription medication.    Gabapentin                      PRESENT      EXPECTED   Trazodone                       PRESENT      EXPECTED   1,3 chlorophenyl piperazine    PRESENT      EXPECTED    1,3-chlorophenyl piperazine is an expected metabolite of trazodone .    Duloxetine                      PRESENT      EXPECTED   Acetaminophen                   PRESENT      EXPECTED   Ibuprofen                       PRESENT      EXPECTED   Diltiazem                       PRESENT      EXPECTED  Drug Present not Declared for Prescription Verification   Diphenhydramine                 PRESENT      UNEXPECTED  Drug Absent but Declared for Prescription Verification   Hydrocodone                     Not Detected UNEXPECTED ng/mg creat   Methocarbamol                   Not Detected UNEXPECTED   Diclofenac                      Not Detected UNEXPECTED    Diclofenac , as indicated in the declared medication list, is not    always detected even when used as directed.    Lidocaine                       Not Detected UNEXPECTED    Lidocaine , as indicated in the declared medication list, is not    always detected even when used as directed.  ==================================================================== Test                      Result    Flag   Units      Ref Range   Creatinine              140              mg/dL      >=79 ==================================================================== Declared Medications:  The flagging and interpretation on this report are based on the  following declared medications.  Unexpected results may arise from  inaccuracies in the declared medications.   **Note: The testing scope of this panel includes these medications:   Clonazepam  (Klonopin )  Diltiazem  (  Cardizem )  Duloxetine  (Cymbalta )  Gabapentin  (Neurontin )  Hydrocodone  (Norco)  Methocarbamol  (Robaxin )  Trazodone   (Desyrel )   **Note: The testing scope of this panel does not include small to  moderate amounts of these reported medications:   Acetaminophen  (Norco)  Diclofenac  (Voltaren )  Ibuprofen  (Advil )  Lidocaine  (Xylocaine )   **Note: The testing scope of this panel does not include the  following reported medications:   Albuterol  (Duoneb)  Albuterol  (Combivent )  Alendronate  (Fosamax )  Buspirone  (Buspar )  Epinephrine  (EpiPen )  Fluticasone  (Flonase )  Fluticasone  (Advair)  Ipratropium (Duoneb)  Ipratropium (Combivent )  Montelukast  (Singulair )  Nystatin  (Mycostatin )  Ondansetron  (Zofran )  Pantoprazole  (Protonix )  Pilocarpine  (Salagen )  Ropinirole  (Requip )  Salmeterol (Advair)  Sumatriptan  (Imitrex )  Tirzepatide  (Mounjaro ) ==================================================================== For clinical consultation, please call [phone]. ====================================================================     No results found for: CBDTHCR No results found for: D8THCCBX No results found for: D9THCCBX  ROS  Constitutional: Denies any fever or chills Gastrointestinal: No reported hemesis, hematochezia, vomiting, or acute GI distress Musculoskeletal: Left knee pain Neurological: No reported episodes of acute onset apraxia, aphasia, dysarthria, agnosia, amnesia, paralysis, loss of coordination, or loss of consciousness  Medication Review  Accu-Chek Guide Me, Accu-Chek Softclix Lancets, DULoxetine , Dupilumab , HYDROcodone -acetaminophen , Ipratropium-Albuterol , SUMAtriptan , acetaminophen , alendronate , apixaban , busPIRone , cefadroxil , clonazePAM , diclofenac  Sodium, fluconazole , fluticasone -salmeterol, gabapentin , glucose blood, ipratropium-albuterol , lidocaine , methocarbamol , montelukast , ondansetron , oxybutynin , pantoprazole , polyethylene glycol, potassium chloride , rOPINIRole , and traZODone   History Review  Allergy: Ms. Nguyen is allergic to librium [chlordiazepoxide],  reglan [metoclopramide], vanilla, aspirin, nsaids, azithromycin , buprenorphine hcl, flexeril  [cyclobenzaprine ], morphine, sulfa antibiotics, tolmetin, amlodipine  besylate, and iron . Drug: Ms. Johnstone  reports no history of drug use. Alcohol:  reports no history of alcohol use. Tobacco:  reports that she has never smoked. She has been exposed to tobacco smoke. She has never used smokeless tobacco. Social: Ms. Delancey  reports that she has never smoked. She has been exposed to tobacco smoke. She has never used smokeless tobacco. She reports that she does not drink alcohol and does not use drugs. Medical:  has a past medical history of Acid reflux, Anemia, Anxiety, Arthritis, Aspiration pneumonia (HCC) (06/02/2022), Asthma, CAP (community acquired pneumonia) (04/24/2022), Cervical disc disorder with radiculopathy of cervical region, Chronic pain syndrome, Chronic, continuous use of opioids, COPD (chronic obstructive pulmonary disease) (HCC), DOE (dyspnea on exertion), Hematuria, Hiatal hernia, History of kidney stones, Hypertension, Hypertensive urgency (05/12/2022), Hypoglycemia, Insomnia, LBBB (left bundle branch block), Migraine, MSSA bacteremia (04/19/2023), Murmur, Osteopenia, Palpitations, Pre-diabetes, Restless leg, Sepsis (HCC) (05/12/2022), Stenosis of cervical spine with myelopathy (HCC), and T2DM (type 2 diabetes mellitus) (HCC). Surgical: Ms. Notarianni  has a past surgical history that includes Ureteroscopy; Laparoscopic hysterectomy; Lithotripsy; HH repair; Cardiac electrophysiology study and ablation; Appendectomy (1990); Cholecystectomy (1990); Joint replacement (Bilateral, [id number]); Extracorporeal shock wave lithotripsy (Left, 09/12/2015); Tonsillectomy; Corneal transplant; Abdominal hysterectomy; Breast surgery; Fracture surgery; Amputation toe (Left, 07/31/2016); Excision bone cyst (Left, 07/31/2016); Colonoscopy with propofol  (N/A, 01/04/2017); Esophagogastroduodenoscopy (egd) with propofol   (N/A, 01/04/2017); Back surgery; Total knee revision (Right, 11/14/2019); periprosthetic supracondylar fracture of left femur (02/16/2020); Hardware Removal (Left, 11/14/2020); Esophagogastroduodenoscopy (N/A, 04/09/2021); Gum surgery (Left); Posterior cervical fusion/foraminotomy (N/A, 03/02/2023); Cervical wound debridement (N/A, 04/17/2023); and laparotomy (N/A, 11/27/2023). Family: family history includes Breast cancer in her maternal aunt; Diabetes in her brother; Hypertension in her brother and mother; Kidney Stones in her father; Prostate cancer in her father; Stroke in her mother.  Laboratory Chemistry Profile   Renal Lab Results  Component Value Date   BUN <5 (L) 11/30/2023   CREATININE 0.38 (L) 11/30/2023  BCR 17 01/15/2022   GFRAA >60 11/16/2019   GFRNONAA >60 11/30/2023    Hepatic Lab Results  Component Value Date   AST 15 11/27/2023   ALT 16 11/27/2023   ALBUMIN 2.5 (L) 11/27/2023   ALKPHOS 63 11/27/2023   LIPASE 19 11/26/2023    Electrolytes Lab Results  Component Value Date   NA 140 11/30/2023   K 3.5 11/30/2023   CL 103 11/30/2023   CALCIUM 7.9 (L) 11/30/2023   MG 1.9 11/30/2023   PHOS 2.9 04/18/2023    Bone Lab Results  Component Value Date   VD25OH 34.34 10/12/2023    Inflammation (CRP: Acute Phase) (ESR: Chronic Phase) Lab Results  Component Value Date   CRP 5.4 (H) 11/17/2023   ESRSEDRATE 63 (H) 11/17/2023   LATICACIDVEN 0.7 11/27/2023         Note: Above Lab results reviewed.  Recent Imaging Review  DG Abd Portable 1V CLINICAL DATA:  NG tube placement  EXAM: PORTABLE ABDOMEN - 1 VIEW  COMPARISON:  CT today  FINDINGS: NG tube tip near the GE junction, possibly within the hiatal hernia seen on CT.  IMPRESSION: NG tube tip likely within the hiatal hernia.  Electronically Signed   By: Franky Crease M.D.   On: 11/26/2023 23:26 CT ABDOMEN PELVIS W CONTRAST CLINICAL DATA:  Abdominal pain, vomiting  EXAM: CT ABDOMEN AND PELVIS WITH  CONTRAST  TECHNIQUE: Multidetector CT imaging of the abdomen and pelvis was performed using the standard protocol following bolus administration of intravenous contrast.  RADIATION DOSE REDUCTION: This exam was performed according to the departmental dose-optimization program which includes automated exposure control, adjustment of the mA and/or kV according to patient size and/or use of iterative reconstruction technique.  CONTRAST:  OMNIPAQUE  IOHEXOL  300 MG/ML  SOLN  COMPARISON:  04/24/2023  FINDINGS: Lower chest: Large hiatal hernia, stable.  No acute findings.  Hepatobiliary: No focal liver abnormality is seen. Status post cholecystectomy. No biliary dilatation. Spleen is enlarged measuring 20 cm in craniocaudal length. This could reflect hepatomegaly or a Riedel's lobe.  Pancreas: Fatty replacement. No focal abnormality or ductal dilatation.  Spleen: No focal abnormality.  Normal size.  Adrenals/Urinary Tract: 3 mm nonobstructing stone in the upper pole of the right kidney. No ureteral stones or hydronephrosis. No suspicious renal or adrenal abnormality. Urinary bladder unremarkable.  Stomach/Bowel: Sigmoid diverticulosis. No active diverticulitis. Abnormal appearing segments of distal small bowel in the pelvis with mild wall thickening and surrounding inflammation. There appears to be an area of narrowing and stricture best seen on images 40 6-47 of coronal series 5. This is immediately adjacent to the abnormally thickened appearing small bowel loop. Differential considerations would include adhesions in this area or inflammatory stricture. It is difficult to completely exclude internal hernia and ischemia. Small bowel loops proximal to this area are dilated with air-fluid levels compatible with a degree of small-bowel obstruction.  Vascular/Lymphatic: No evidence of aneurysm or adenopathy.  Reproductive: Prior hysterectomy.  No adnexal masses.  Other: No  free fluid or free air.  Musculoskeletal: No acute bony abnormality.  IMPRESSION: Abnormal appearing distal small bowel within the pelvis with wall thickening and surrounding haziness/inflammation. Differential considerations would include enteritis (infectious, inflammatory or ischemic). There appears to be a stricture/area of narrowing with resulting small bowel obstruction with proximal loops fluid-filled with scattered air-fluid levels. While this could be related to enteritis and stricture or adhesions, cannot exclude internal hernia. Recommend surgical consultation.  Liver measures enlarged at 20 cm in  could reflect hepatomegaly or Riedel's lobe.  Large hiatal hernia.  These results were called by telephone at the time of interpretation on 11/26/2023 at 7:59 pm to provider KEVIN PADUCHOWSKI , who verbally acknowledged these results.  Electronically Signed   By: Franky Crease M.D.   On: 11/26/2023 19:59 Note: Reviewed        Physical Exam  Vitals: BP 104/65   Pulse (!) 104   Temp 98.4 F (36.9 C)   Resp 16   Ht 4' 11.5 (1.511 m)   Wt 108 lb (49 kg)   SpO2 97%   BMI 21.45 kg/m  BMI: Estimated body mass index is 21.45 kg/m as calculated from the following:   Height as of this encounter: 4' 11.5 (1.511 m).   Weight as of this encounter: 108 lb (49 kg). Ideal: Patient must be at least 60 in tall to calculate ideal body weight General appearance: Well nourished, well developed, and well hydrated. In no apparent acute distress Mental status: Alert, oriented x 3 (person, place, & time)       Respiratory: No evidence of acute respiratory distress Eyes: PERLA  Left knee pain, worse with weightbearing, swelling/edema noted  Patient presents in wheelchair Assessment   Diagnosis  1. Primary osteoarthritis of left knee   2. Chronic pain of left knee      Updated Problems: No problems updated.  Plan of Care  Assessment and Plan    Chronic left knee pain  post-total knee arthroplasty with possible hairline fracture   Chronic left knee pain persists following total knee arthroplasty, potentially due to a hairline fracture as suggested by the orthopedic evaluation. The pain is severe and acute, worsened by the suspected fracture. Current pain medication is insufficient, necessitating further management. A scan is planned to confirm the fracture. She seeks additional pain management options, with a genicular nerve block considered. The pain clinic emphasizes injections and non-opioid management for chronic pain, while acute fracture pain should improve over time. Discuss the genicular nerve block with Dr. Kathlynn to assess its suitability. Conduct a urine drug screen to establish care in the pain clinic. Advise her to request another prescription from Dr. Kathlynn.  I explained to the patient that her hairline fracture should improve with time and that chronic opioid therapy should not be a part of a long-term pain management plan.  If she were to follow back up with us  for chronic opioid management it would be for hydrocodone  5 mg 3 times daily as needed with the goal of weaning as her injury improved.   I have also discussed potential left genicular nerve block with her.  She will think about this      Orders:  Orders Placed This Encounter  Procedures   Compliance Drug Analysis, Ur    Volume: 30 ml(s). Minimum 3 ml of urine is needed. Document temperature of fresh sample. Indications: Long term (current) use of opiate analgesic (Z79.891) Test#: [id number] (Comprehensive Profile)    Release to patient:   Immediate   Compliance Drug Analysis, Ur    Volume: 30 ml(s). Minimum 3 ml of urine is needed. Document temperature of fresh sample. Indications: Long term (current) use of opiate analgesic (Z79.891) Test#: [id number] (Comprehensive Profile)    Release to patient:   Immediate     Cervical ESI, bilateral occipital nerve block 11/03/2023   Return in about 2  weeks (around 01/18/2024) for Emmy Blanch, MM.    Recent Visits Date Type Provider Dept  11/03/23  Procedure visit Marcelino Nurse, MD [facility]  10/26/23 Office Visit Marcelino Nurse, MD [facility]  Showing recent visits within past 90 days and meeting all other requirements Today's Visits Date Type Provider Dept  01/04/24 Office Visit Marcelino Nurse, MD [facility]  Showing today's visits and meeting all other requirements Future Appointments No visits were found meeting these conditions. Showing future appointments within next 90 days and meeting all other requirements  I discussed the assessment and treatment plan with the patient. The patient was provided an opportunity to ask questions and all were answered. The patient agreed with the plan and demonstrated an understanding of the instructions.  Patient advised to call back or seek an in-person evaluation if the symptoms or condition worsens.  I spent a total of 30 minutes reviewing chart data, face-to-face evaluation with the patient, counseling and coordination of care as detailed above.   Note by: Nurse Marcelino, MD (TTS and AI technology used. I apologize for any typographical errors that were not detected and corrected.) Date: 01/04/2024; Time: 10:30 AM

## 2024-01-04 NOTE — Patient Instructions (Signed)
 Discuss Genicular nerve block with Dr Kathlynn Urine screen today

## 2024-01-05 ENCOUNTER — Ambulatory Visit: Payer: Self-pay

## 2024-01-05 NOTE — Telephone Encounter (Signed)
 Patient requested to be seen 01/12/2024, by Dr. Isadora. Reports shortness of breath is ongoing. NFN.

## 2024-01-05 NOTE — Telephone Encounter (Signed)
 FYI Only or Action Required?: Action required by provider: request for appointment.  Patient was last seen in primary care on .  Called Nurse Triage reporting Shortness of Breath.  Symptoms began about a month ago.  Interventions attempted: Prescription medications: inhaler and Ice/heat application.  Symptoms are: gradually worsening. SOB comes and goes.No cough. Warm transfer to [place] in the practice for appointment.  Triage Disposition: See HCP Within 4 Hours (Or PCP Triage)  Patient/caregiver understands and will follow disposition?:       Copied from CRM [id number]. Topic: Clinical - Red Word Triage >> Jan 05, 2024  9:54 AM Rozanna MATSU wrote: Red Word that prompted transfer to Nurse Triage: shortness of breath Answer Assessment - Initial Assessment Questions 1. RESPIRATORY STATUS: Describe your breathing? (e.g., wheezing, shortness of breath, unable to speak, severe coughing)      SOB 2. ONSET: When did this breathing problem begin?      1 Month 3. PATTERN Does the difficult breathing come and go, or has it been constant since it started?      Comes and goes 4. SEVERITY: How bad is your breathing? (e.g., mild, moderate, severe)      moderate 5. RECURRENT SYMPTOM: Have you had difficulty breathing before? If Yes, ask: When was the last time? and What happened that time?      yes 6. CARDIAC HISTORY: Do you have any history of heart disease? (e.g., heart attack, angina, bypass surgery, angioplasty)      no 7. LUNG HISTORY: Do you have any history of lung disease?  (e.g., pulmonary embolus, asthma, emphysema)     yes 8. CAUSE: What do you think is causing the breathing problem?      unsure 9. OTHER SYMPTOMS: Do you have any other symptoms? (e.g., chest pain, cough, dizziness, fever, runny nose)     no 10. O2 SATURATION MONITOR:  Do you use an oxygen  saturation monitor (pulse oximeter) at home? If Yes, ask: What is your reading (oxygen  level) today?  What is your usual oxygen  saturation reading? (e.g., 95%)       no 11. PREGNANCY: Is there any chance you are pregnant? When was your last menstrual period?       no 12. TRAVEL: Have you traveled out of the country in the last month? (e.g., travel history, exposures)       no  Protocols used: Breathing Difficulty-A-AH  Reason for Disposition  [1] Longstanding difficulty breathing AND [2] not responding to usual therapy  Answer Assessment - Initial Assessment Questions 1. RESPIRATORY STATUS: Describe your breathing? (e.g., wheezing, shortness of breath, unable to speak, severe coughing)      SOB 2. ONSET: When did this breathing problem begin?      1 Month 3. PATTERN Does the difficult breathing come and go, or has it been constant since it started?      Comes and goes 4. SEVERITY: How bad is your breathing? (e.g., mild, moderate, severe)      moderate 5. RECURRENT SYMPTOM: Have you had difficulty breathing before? If Yes, ask: When was the last time? and What happened that time?      yes 6. CARDIAC HISTORY: Do you have any history of heart disease? (e.g., heart attack, angina, bypass surgery, angioplasty)      no 7. LUNG HISTORY: Do you have any history of lung disease?  (e.g., pulmonary embolus, asthma, emphysema)     yes 8. CAUSE: What do you think is causing the breathing problem?  unsure 9. OTHER SYMPTOMS: Do you have any other symptoms? (e.g., chest pain, cough, dizziness, fever, runny nose)     no 10. O2 SATURATION MONITOR:  Do you use an oxygen  saturation monitor (pulse oximeter) at home? If Yes, ask: What is your reading (oxygen  level) today? What is your usual oxygen  saturation reading? (e.g., 95%)       no 11. PREGNANCY: Is there any chance you are pregnant? When was your last menstrual period?       no 12. TRAVEL: Have you traveled out of the country in the last month? (e.g., travel history, exposures)       no  Protocols  used: Breathing Difficulty-A-AH

## 2024-01-06 ENCOUNTER — Other Ambulatory Visit: Payer: Self-pay | Admitting: Nurse Practitioner

## 2024-01-06 DIAGNOSIS — J301 Allergic rhinitis due to pollen: Secondary | ICD-10-CM

## 2024-01-07 LAB — COMPLIANCE DRUG ANALYSIS, UR

## 2024-01-12 ENCOUNTER — Ambulatory Visit: Admitting: Student in an Organized Health Care Education/Training Program

## 2024-01-12 ENCOUNTER — Encounter: Payer: Self-pay | Admitting: Student in an Organized Health Care Education/Training Program

## 2024-01-12 VITALS — BP 118/64 | HR 108 | Temp 97.9°F | Ht 59.5 in | Wt 107.4 lb

## 2024-01-12 DIAGNOSIS — J4540 Moderate persistent asthma, uncomplicated: Secondary | ICD-10-CM

## 2024-01-12 NOTE — Progress Notes (Signed)
 Assessment & Plan:   #Moderate Persistent Asthma  She has a history of asthma, with previous blood work showing eosinophilia to 800. IgE not significantly elevated. She was previously continued on ICS/LABA, and biologics initiated with Dupilumab  with good response. Today, returns for follow up with worsening symptoms that I suspect is due to medication non-adherence given she's held her wixela and dupilumab . Recommended that she resume both, obtain PFT's (Scheduled next week), and return for follow up to re-assess symptoms. Did counsel on importance of mouth washing to prevent thrush (brushing teeth twice daily)  -resumed Wixela one puff bid -resume Dupilumab   I spent 32 minutes caring for this patient today, including preparing to see the patient, obtaining a medical history , reviewing a separately obtained history, performing a medically appropriate examination and/or evaluation, counseling and educating the patient/family/caregiver, ordering medications, tests, or procedures, documenting clinical information in the electronic health record, and independently interpreting results (not separately reported/billed) and communicating results to the patient/family/caregiver  Belva November, MD [facility]   End of visit medications:  No orders of the defined types were placed in this encounter.    Current Outpatient Medications:    Accu-Chek Softclix Lancets lancets, USE TO TEST BLOOD SUGAR EVERY DAY AS NEEDED FOR type 2 diabetes, Disp: 200 each, Rfl: 1   acetaminophen  (TYLENOL ) 500 MG tablet, Take 2 tablets (1,000 mg total) by mouth every 6 (six) hours as needed., Disp: 100 tablet, Rfl: 2   alendronate  (FOSAMAX ) 70 MG tablet, TAKE 1 TABLET BY MOUTH WEEKLY  TAKE WITH A FULL GLASS OF WATER   ON AN EMPTY STOMACH, Disp: 12 tablet, Rfl: 3   apixaban  (ELIQUIS ) 5 MG TABS tablet, Take 1 tablet (5 mg total) by mouth 2 (two) times daily., Disp: 180 tablet, Rfl: 3   Blood  Glucose Monitoring Suppl (ACCU-CHEK GUIDE ME) w/Device KIT, Use device to check glucose daily and as need for prediabetes R73.03, Disp: 1 kit, Rfl: 0   busPIRone  (BUSPAR ) 10 MG tablet, Take 1 tablet (10 mg total) by mouth 2 (two) times daily., Disp: 180 tablet, Rfl: 3   cefadroxil  (DURICEF) 500 MG capsule, Take 1 capsule (500 mg total) by mouth 2 (two) times daily., Disp: 60 capsule, Rfl: 3   clonazePAM  (KLONOPIN ) 1 MG tablet, Take 0.5-1 tablets (0.5-1 mg total) by mouth 2 (two) times daily as needed for anxiety., Disp: 60 tablet, Rfl: 0   COMBIVENT  RESPIMAT 20-100 MCG/ACT AERS respimat, 1 puff every 6 (six) hours., Disp: , Rfl:    diclofenac  Sodium (VOLTAREN ) 1 % GEL, Apply 4 g topically 4 (four) times daily., Disp: 350 g, Rfl: 1   DULoxetine  (CYMBALTA ) 60 MG capsule, Take 1 capsule (60 mg total) by mouth daily., Disp: 90 capsule, Rfl: 3   Dupilumab  (DUPIXENT ) 300 MG/2ML SOAJ, Inject 300 mg into the skin every 14 (fourteen) days. ** Schedule appointment for further refills. **, Disp: 4 mL, Rfl: 1   fluconazole  (DIFLUCAN ) 150 MG tablet, Take 1 tablet (150 mg total) by mouth daily for 14 doses. May take an additional dose after 3 days if still symptomatic., Disp: 14 tablet, Rfl: 0   fluticasone -salmeterol (WIXELA INHUB) 250-50 MCG/ACT AEPB, Inhale 1 puff into the lungs in the morning and at bedtime., Disp: , Rfl:    gabapentin  (NEURONTIN ) 300 MG capsule, Take 1 capsule (300 mg total) by mouth 2 (two) times daily., Disp: 180 capsule, Rfl: 3   glucose blood (ACCU-CHEK GUIDE TEST) test strip, TEST BLOOD SUGAR ONCE DAILY  AND AS NEEDED FOR type 2 diabetes, Disp: 200 strip, Rfl: 1   ipratropium-albuterol  (DUONEB) 0.5-2.5 (3) MG/3ML SOLN, Take 3 mLs by nebulization every 6 (six) hours as needed., Disp: , Rfl:    lidocaine  (LIDODERM ) 5 %, PLACE 1 PATCH ONTO THE AFFECTED AREA OF THE NECK DAILY, REMOVE AND DISCARD WITHIN 12 HOURS OR AS DIRECTED, Disp: 30 patch, Rfl: 11   methocarbamol  (ROBAXIN ) 750 MG tablet,  Take 1 tablet (750 mg total) by mouth every 8 (eight) hours as needed for muscle spasms., Disp: 90 tablet, Rfl: 5   montelukast  (SINGULAIR ) 10 MG tablet, TAKE 1 TABLET(10 MG) BY MOUTH AT BEDTIME, Disp: 90 tablet, Rfl: 3   ondansetron  (ZOFRAN ) 4 MG tablet, TAKE 1 TABLET BY MOUTH TWICE DAILY AS NEEDED, Disp: 45 tablet, Rfl: 1   oxybutynin  (DITROPAN -XL) 10 MG 24 hr tablet, TAKE 1 TABLET(10 MG) BY MOUTH AT BEDTIME, Disp: 30 tablet, Rfl: 5   pantoprazole  (PROTONIX ) 40 MG tablet, Take 1 tablet (40 mg total) by mouth 2 (two) times daily., Disp: 180 tablet, Rfl: 1   polyethylene glycol (MIRALAX  / GLYCOLAX ) 17 g packet, Take 17 g by mouth 2 (two) times daily., Disp: , Rfl:    potassium chloride  (KLOR-CON  10) 10 MEQ tablet, Take 1 tablet (10 mEq total) by mouth daily., Disp: 90 tablet, Rfl: 1   rOPINIRole  (REQUIP ) 1 MG tablet, TAKE 1 TABLET(1 MG) BY MOUTH AT BEDTIME, Disp: 90 tablet, Rfl: 1   SUMAtriptan  (IMITREX ) 100 MG tablet, Take 1 tablet (100 mg total) by mouth every 2 (two) hours as needed (for migraine headaches.). May repeat in 1 hours if headache persists or recurs., Disp: 10 tablet, Rfl: 3   traZODone  (DESYREL ) 150 MG tablet, TAKE 1 TABLET(150 MG) BY MOUTH AT BEDTIME, Disp: 90 tablet, Rfl: 3   HYDROcodone -acetaminophen  (NORCO/VICODIN) 5-325 MG tablet, Take 1 tablet by mouth 2 (two) times daily as needed for severe pain (pain score 7-10). (Patient not taking: Reported on 01/12/2024), Disp: 45 tablet, Rfl: 0   Subjective:   PATIENT ID: Summer Murphy Ao GENDER: female DOB: [birth year], MRN: [id number]  Chief Complaint  Patient presents with   Asthma    Shortness of breath on exertion and at rest. Has missed Dupixent  injection. Stopped Wixela due thrush.     HPI  Patient is a pleasant 68 year old female with a past medical history of asthma presenting for follow up. Patient was previously seen by Dr. Darlean but has transitioned her care given he is no longer seeing patients in our [place]  office.   Patient had established care in our clinic with Dr. Darlean for asthma. She's had a long standing history of asthma, with previous PFT's showing obstruction (scooping on flow volume loop and FEV1 of 60% predicted - normal lung volumes, normal DLCO).  She was previously seen by surgery for her hiatal hernia and saw Dr. Jordis locally and was seen at [facility] Given she's had two surgeries for her hiatal hernia, further interventions were deferred.   Return Visit 06/16/2023:   Patient was last seen in clinic in September of 2024. During that visit, her symptoms were improved and we stepped down therapy from highest dose ICS to medium dose ICS. Since our last visit, she reported increased symptoms, and describes acute worsening around 10 days ago. She feels she might have aspirated and that resulted in shortness of breath, but when further questioned, the symptoms started at night and woke her up from sleep. She was not eating at  the time. She's had increased cough and increased shortness of breath. There's also increase wheezing. She has not had any prednisone  since.   Return Visit 10/04/2023:   Feels significant improvement in symptoms with Dupilumab . Her dyspnea, cough, and wheeze are near resolved. She is feeling her symptoms are 8/10 improved. She is tolerating dupilumab  injections well. She was not using her Wixela.  Return Visit 01/12/2024:  Had recurrence of symptoms over the past two weeks with increased shortness of breath, wheeze, and chest tightness. She stopped her Wixela two weeks ago secondary to thrush. She's also missed her Dupixant shots x2. She was admitted to the hospital 9/5-11/30/2023 due to small bowel obstruction, s/p exploratory laparotomy with lysis of adhesions.  Ancillary information including prior medications, full medical/surgical/family/social histories, and PFTs (when available) are listed below and have been reviewed.   Review of Systems  Constitutional:  Negative for  chills, fever, malaise/fatigue and weight loss.  Respiratory:  Positive for cough, shortness of breath and wheezing. Negative for hemoptysis and sputum production.   Cardiovascular:  Negative for chest pain.     Objective:   Vitals:   01/12/24 1000  BP: 118/64  Pulse: (!) 108  Temp: 97.9 F (36.6 C)  TempSrc: Temporal  SpO2: 97%  Weight: 107 lb 6.4 oz (48.7 kg)  Height: 4' 11.5 (1.511 m)   97% on RA  BMI Readings from Last 3 Encounters:  01/12/24 21.33 kg/m  01/04/24 21.45 kg/m  01/03/24 21.45 kg/m   Wt Readings from Last 3 Encounters:  01/12/24 107 lb 6.4 oz (48.7 kg)  01/04/24 108 lb (49 kg)  01/03/24 108 lb (49 kg)    Physical Exam    Ancillary Information    Past Medical History:  Diagnosis Date   Acid reflux    Anemia    Anxiety    a.) buspirone  BID + BZO PRN (clonazepam )   Arthritis    Aspiration pneumonia (HCC) 06/02/2022   Asthma    CAP (community acquired pneumonia) 04/24/2022   Cervical disc disorder with radiculopathy of cervical region    Chronic pain syndrome    a.) on COT as prescribed by pain mangement   Chronic, continuous use of opioids    a.) followed by pain mangement   COPD (chronic obstructive pulmonary disease) (HCC)    DOE (dyspnea on exertion)    Hematuria    Hiatal hernia    History of kidney stones    Hypertension    Hypertensive urgency 05/12/2022   Hypoglycemia    Insomnia    a.) uses Trazodone  PRN   LBBB (left bundle branch block)    Migraine    MSSA bacteremia 04/19/2023   Murmur    Osteopenia    Palpitations    Pre-diabetes    Restless leg    a.) on ropinirole    Sepsis (HCC) 05/12/2022   Stenosis of cervical spine with myelopathy (HCC)    T2DM (type 2 diabetes mellitus) (HCC)      Family History  Problem Relation Age of Onset   Hypertension Mother    Stroke Mother    Prostate cancer Father    Kidney Stones Father    Diabetes Brother    Hypertension Brother    Breast cancer Maternal Aunt    Kidney  disease Neg Hx      Past Surgical History:  Procedure Laterality Date   ABDOMINAL HYSTERECTOMY     AMPUTATION TOE Left 07/31/2016   Procedure: AMPUTATION TOE/MPJ 2nd toe;  Surgeon: Neill,  Krystal, DPM;  Location: [facility];  Service: Podiatry;  Laterality: Left;   APPENDECTOMY  1990   BACK SURGERY     low back   BREAST SURGERY     bilateral breast reduction   CARDIAC ELECTROPHYSIOLOGY STUDY AND ABLATION     CERVICAL WOUND DEBRIDEMENT N/A 04/17/2023   Procedure: CERVICAL WOUND DEBRIDEMENT;  Surgeon: Bluford Standing, MD;  Location: [facility];  Service: Neurosurgery;  Laterality: N/A;   CHOLECYSTECTOMY  1990   COLONOSCOPY WITH PROPOFOL  N/A 01/04/2017   Procedure: COLONOSCOPY WITH PROPOFOL ;  Surgeon: Viktoria Lamar DASEN, MD;  Location: [facility] ENDOSCOPY;  Service: Endoscopy;  Laterality: N/A;   CORNEAL TRANSPLANT     ESOPHAGOGASTRODUODENOSCOPY N/A 04/09/2021   Procedure: ESOPHAGOGASTRODUODENOSCOPY (EGD);  Surgeon: Unk Corinn Skiff, MD;  Location: [facility] ENDOSCOPY;  Service: Gastroenterology;  Laterality: N/A;   ESOPHAGOGASTRODUODENOSCOPY (EGD) WITH PROPOFOL  N/A 01/04/2017   Procedure: ESOPHAGOGASTRODUODENOSCOPY (EGD) WITH PROPOFOL ;  Surgeon: Viktoria Lamar DASEN, MD;  Location: [facility] ENDOSCOPY;  Service: Endoscopy;  Laterality: N/A;   EXCISION BONE CYST Left 07/31/2016   Procedure: EXCISION BONE CYST/exostectomy 28124/left 2nd;  Surgeon: Neill Krystal, DPM;  Location: [facility];  Service: Podiatry;  Laterality: Left;   EXTRACORPOREAL SHOCK WAVE LITHOTRIPSY Left 09/12/2015   Procedure: EXTRACORPOREAL SHOCK WAVE LITHOTRIPSY (ESWL);  Surgeon: Rosina Riis, MD;  Location: [facility];  Service: Urology;  Laterality: Left;   FRACTURE SURGERY     left foot   GUM SURGERY Left    gum infection   HARDWARE REMOVAL Left 11/14/2020   Procedure: HARDWARE REMOVAL;  Surgeon: Kathlynn Sharper, MD;  Location: [facility];  Service: Orthopedics;  Laterality: Left;   HH repair     Fundoplication   JOINT REPLACEMENT Bilateral  2013,2014   total knees   LAPAROSCOPIC HYSTERECTOMY     LAPAROTOMY N/A 11/27/2023   Procedure: LAPAROTOMY, EXPLORATORY lysis of adhesions;  Surgeon: Lane Shope, MD;  Location: [facility];  Service: General;  Laterality: N/A;   LITHOTRIPSY     periprosthetic supracondylar fracture of left femur  02/16/2020   [facility]   POSTERIOR CERVICAL FUSION/FORAMINOTOMY N/A 03/02/2023   Procedure: C3-6 POSTERIOR CERVICAL LAMINECTOMY AND FUSION;  Surgeon: Claudene Riis ORN, MD;  Location: [facility];  Service: Neurosurgery;  Laterality: N/A;   TONSILLECTOMY     TOTAL KNEE REVISION Right 11/14/2019   Procedure: Revision patella and tibial polyethylene;  Surgeon: Kathlynn Sharper, MD;  Location: [facility];  Service: Orthopedics;  Laterality: Right;   URETEROSCOPY      Social History   Socioeconomic History   Marital status: Widowed    Spouse name: Not on file   Number of children: Not on file   Years of education: Not on file   Highest education level: Not on file  Occupational History   Not on file  Tobacco Use   Smoking status: Never    Passive exposure: Past   Smokeless tobacco: Never  Vaping Use   Vaping status: Never Used  Substance and Sexual Activity   Alcohol use: No   Drug use: No   Sexual activity: Not Currently  Other Topics Concern   Not on file  Social History Narrative   Lives at home alone. Relatives are the support person.    Social Drivers of Corporate investment banker Strain: Low Risk  (12/20/2023)   Received from [facility]   Overall Financial Resource Strain (CARDIA)    Difficulty of Paying Living Expenses: Not hard at all  Food Insecurity: No Food Insecurity (  12/20/2023)   [facility]   Hunger Vital Sign    Within the past 12 months, you worried that your food would run out before you got the money to buy more.: Never true    Within the past 12 months, the food you bought just didn't last and you didn't have  money to get more.: Never true  Transportation Needs: No Transportation Needs (12/20/2023)   Received from [facility] - Transportation    In the past 12 months, has lack of transportation kept you from medical appointments or from getting medications?: No    Lack of Transportation (Non-Medical): No  Physical Activity: Not on file  Stress: Not on file  Social Connections: Moderately Integrated (11/26/2023)   Social Connection and Isolation Panel    Frequency of Communication with Friends and Family: More than three times a week    Frequency of Social Gatherings with Friends and Family: More than three times a week    Attends Religious Services: 1 to 4 times per year    Active Member of [employer] or Organizations: Yes    Attends Banker Meetings: 1 to 4 times per year    Marital Status: Widowed  Intimate Partner Violence: Not At Risk (11/26/2023)   Humiliation, Afraid, Rape, and Kick questionnaire    Fear of Current or Ex-Partner: No    Emotionally Abused: No    Physically Abused: No    Sexually Abused: No     Allergies  Allergen Reactions   Librium [Chlordiazepoxide] Shortness Of Breath   Reglan [Metoclopramide] Hives and Other (See Comments)    hallucinations    Vanilla Shortness Of Breath    Pt reports allergy to vanilla extract only   Aspirin Hives   Nsaids Rash    Rash/flares asthma issues.   Azithromycin  Other (See Comments)    Unsure of what the reaction was.   Buprenorphine Hcl Other (See Comments)    Unsure of what the reaction was.    Flexeril  [Cyclobenzaprine ]     hallucinations   Morphine Other (See Comments)    Unsure of reaction   Sulfa Antibiotics     Other reaction(s): Unknown   Tolmetin     Other Reaction: Allergy   Amlodipine  Besylate Itching and Rash    arms, stomach and forehead   Iron  Nausea And Vomiting     CBC    Component Value Date/Time   WBC 10.4 11/29/2023 0415   RBC 2.98 (L) 11/29/2023 0415   HGB 9.2  (L) 11/30/2023 0649   HGB 12.6 07/30/2023 0956   HGB 13.4 08/29/2021 1303   HCT 24.4 (L) 11/29/2023 0415   HCT 39.4 08/29/2021 1303   PLT 435 (H) 11/29/2023 0415   PLT 255 07/30/2023 0956   PLT 186 08/29/2021 1303   MCV 81.9 11/29/2023 0415   MCV 91 08/29/2021 1303   MCV 84 06/28/2014 1710   MCH 25.8 (L) 11/29/2023 0415   MCHC 31.6 11/29/2023 0415   RDW 15.9 (H) 11/29/2023 0415   RDW 12.6 08/29/2021 1303   RDW 16.7 (H) 06/28/2014 1710   LYMPHSABS 1.7 11/11/2023 2012   LYMPHSABS 2.0 08/29/2021 1303   LYMPHSABS 0.6 (L) 06/13/2013 0505   MONOABS 0.6 11/11/2023 2012   MONOABS 0.1 (L) 06/13/2013 0505   EOSABS 0.2 11/11/2023 2012   EOSABS 0.2 08/29/2021 1303   EOSABS 0.0 06/13/2013 0505   BASOSABS 0.0 11/11/2023 2012   BASOSABS 0.0 08/29/2021 1303  BASOSABS 0.0 06/13/2013 0505    Pulmonary Functions Testing Results:     No data to display          Outpatient Medications Prior to Visit  Medication Sig Dispense Refill   Accu-Chek Softclix Lancets lancets USE TO TEST BLOOD SUGAR EVERY DAY AS NEEDED FOR type 2 diabetes 200 each 1   acetaminophen  (TYLENOL ) 500 MG tablet Take 2 tablets (1,000 mg total) by mouth every 6 (six) hours as needed. 100 tablet 2   alendronate  (FOSAMAX ) 70 MG tablet TAKE 1 TABLET BY MOUTH WEEKLY  TAKE WITH A FULL GLASS OF WATER   ON AN EMPTY STOMACH 12 tablet 3   apixaban  (ELIQUIS ) 5 MG TABS tablet Take 1 tablet (5 mg total) by mouth 2 (two) times daily. 180 tablet 3   Blood Glucose Monitoring Suppl (ACCU-CHEK GUIDE ME) w/Device KIT Use device to check glucose daily and as need for prediabetes R73.03 1 kit 0   busPIRone  (BUSPAR ) 10 MG tablet Take 1 tablet (10 mg total) by mouth 2 (two) times daily. 180 tablet 3   cefadroxil  (DURICEF) 500 MG capsule Take 1 capsule (500 mg total) by mouth 2 (two) times daily. 60 capsule 3   clonazePAM  (KLONOPIN ) 1 MG tablet Take 0.5-1 tablets (0.5-1 mg total) by mouth 2 (two) times daily as needed for anxiety. 60 tablet 0    COMBIVENT  RESPIMAT 20-100 MCG/ACT AERS respimat 1 puff every 6 (six) hours.     diclofenac  Sodium (VOLTAREN ) 1 % GEL Apply 4 g topically 4 (four) times daily. 350 g 1   DULoxetine  (CYMBALTA ) 60 MG capsule Take 1 capsule (60 mg total) by mouth daily. 90 capsule 3   Dupilumab  (DUPIXENT ) 300 MG/2ML SOAJ Inject 300 mg into the skin every 14 (fourteen) days. ** Schedule appointment for further refills. ** 4 mL 1   fluconazole  (DIFLUCAN ) 150 MG tablet Take 1 tablet (150 mg total) by mouth daily for 14 doses. May take an additional dose after 3 days if still symptomatic. 14 tablet 0   fluticasone -salmeterol (WIXELA INHUB) 250-50 MCG/ACT AEPB Inhale 1 puff into the lungs in the morning and at bedtime.     gabapentin  (NEURONTIN ) 300 MG capsule Take 1 capsule (300 mg total) by mouth 2 (two) times daily. 180 capsule 3   glucose blood (ACCU-CHEK GUIDE TEST) test strip TEST BLOOD SUGAR ONCE DAILY AND AS NEEDED FOR type 2 diabetes 200 strip 1   ipratropium-albuterol  (DUONEB) 0.5-2.5 (3) MG/3ML SOLN Take 3 mLs by nebulization every 6 (six) hours as needed.     lidocaine  (LIDODERM ) 5 % PLACE 1 PATCH ONTO THE AFFECTED AREA OF THE NECK DAILY, REMOVE AND DISCARD WITHIN 12 HOURS OR AS DIRECTED 30 patch 11   methocarbamol  (ROBAXIN ) 750 MG tablet Take 1 tablet (750 mg total) by mouth every 8 (eight) hours as needed for muscle spasms. 90 tablet 5   montelukast  (SINGULAIR ) 10 MG tablet TAKE 1 TABLET(10 MG) BY MOUTH AT BEDTIME 90 tablet 3   ondansetron  (ZOFRAN ) 4 MG tablet TAKE 1 TABLET BY MOUTH TWICE DAILY AS NEEDED 45 tablet 1   oxybutynin  (DITROPAN -XL) 10 MG 24 hr tablet TAKE 1 TABLET(10 MG) BY MOUTH AT BEDTIME 30 tablet 5   pantoprazole  (PROTONIX ) 40 MG tablet Take 1 tablet (40 mg total) by mouth 2 (two) times daily. 180 tablet 1   polyethylene glycol (MIRALAX  / GLYCOLAX ) 17 g packet Take 17 g by mouth 2 (two) times daily.     potassium chloride  (KLOR-CON  10) 10 MEQ  tablet Take 1 tablet (10 mEq total) by mouth daily. 90  tablet 1   rOPINIRole  (REQUIP ) 1 MG tablet TAKE 1 TABLET(1 MG) BY MOUTH AT BEDTIME 90 tablet 1   SUMAtriptan  (IMITREX ) 100 MG tablet Take 1 tablet (100 mg total) by mouth every 2 (two) hours as needed (for migraine headaches.). May repeat in 1 hours if headache persists or recurs. 10 tablet 3   traZODone  (DESYREL ) 150 MG tablet TAKE 1 TABLET(150 MG) BY MOUTH AT BEDTIME 90 tablet 3   HYDROcodone -acetaminophen  (NORCO/VICODIN) 5-325 MG tablet Take 1 tablet by mouth 2 (two) times daily as needed for severe pain (pain score 7-10). (Patient not taking: Reported on 01/12/2024) 45 tablet 0   fluticasone -salmeterol (ADVAIR) 250-50 MCG/ACT AEPB INHALE ONE (1) PUFF BY MOUTH IN THE MORNING AND AT BEDTIME (Patient not taking: Reported on 01/12/2024) 60 each 11   No facility-administered medications prior to visit.

## 2024-01-13 ENCOUNTER — Ambulatory Visit: Attending: Nurse Practitioner | Admitting: Nurse Practitioner

## 2024-01-13 ENCOUNTER — Ambulatory Visit: Admitting: Infectious Diseases

## 2024-01-13 ENCOUNTER — Encounter: Payer: Self-pay | Admitting: Nurse Practitioner

## 2024-01-13 VITALS — BP 100/52 | HR 94 | Temp 97.9°F | Resp 18 | Ht 59.5 in | Wt 107.0 lb

## 2024-01-13 DIAGNOSIS — G8929 Other chronic pain: Secondary | ICD-10-CM | POA: Diagnosis present

## 2024-01-13 DIAGNOSIS — M4802 Spinal stenosis, cervical region: Secondary | ICD-10-CM | POA: Diagnosis present

## 2024-01-13 DIAGNOSIS — Z7901 Long term (current) use of anticoagulants: Secondary | ICD-10-CM | POA: Insufficient documentation

## 2024-01-13 DIAGNOSIS — M1712 Unilateral primary osteoarthritis, left knee: Secondary | ICD-10-CM | POA: Diagnosis present

## 2024-01-13 DIAGNOSIS — G894 Chronic pain syndrome: Secondary | ICD-10-CM | POA: Insufficient documentation

## 2024-01-13 DIAGNOSIS — M5481 Occipital neuralgia: Secondary | ICD-10-CM | POA: Insufficient documentation

## 2024-01-13 DIAGNOSIS — M5412 Radiculopathy, cervical region: Secondary | ICD-10-CM | POA: Insufficient documentation

## 2024-01-13 DIAGNOSIS — M25562 Pain in left knee: Secondary | ICD-10-CM | POA: Diagnosis present

## 2024-01-13 MED ORDER — HYDROCODONE-ACETAMINOPHEN 5-325 MG PO TABS
1.0000 | ORAL_TABLET | Freq: Two times a day (BID) | ORAL | 0 refills | Status: DC | PRN
Start: 2024-01-13 — End: 2024-01-24

## 2024-01-13 NOTE — Patient Instructions (Addendum)
 ______________________________________________________________________    Preparing for your procedure  Appointments: If you think you may not be able to keep your appointment, call 24-48 hours in advance to cancel. We need time to make it available to others.  Procedure visits are for procedures only. During your procedure appointment there will be: NO Prescription Refills*. NO medication changes or discussions*. NO discussion of disability issues*. NO unrelated pain problem evaluations*. NO evaluations to order other pain procedures*. *These will be addressed at a separate and distinct evaluation encounter on the provider's evaluation schedule and not during procedure days.  Instructions: Food intake: Avoid eating anything solid for at least 8 hours prior to your procedure. Clear liquid intake: You may take clear liquids such as water  up to 2 hours prior to your procedure. (No carbonated drinks. No soda.) Transportation: Unless otherwise stated by your physician, bring a driver. (Driver cannot be a Market researcher, Pharmacist, community, or any other form of public transportation.) Morning Medicines: Except for blood thinners, take all of your other morning medications with a sip of water . Make sure to take your heart and blood pressure medicines. If your blood pressure's lower number is above 100, the case will be rescheduled. Blood thinners: Make sure to stop your blood thinners as instructed.  If you take a blood thinner, but were not instructed to stop it, call our office [phone] and ask to talk to a nurse. Not stopping a blood thinner prior to certain procedures could lead to serious complications. Diabetics on insulin : Notify the staff so that you can be scheduled 1st case in the morning. If your diabetes requires high dose insulin , take only  of your normal insulin  dose the morning of the procedure and notify the staff that you have done so. Preventing infections: Shower with an antibacterial soap the  morning of your procedure.  Build-up your immune system: Take 1000 mg of Vitamin C with every meal (3 times a day) the day prior to your procedure. Antibiotics: Inform the nursing staff if you are taking any antibiotics or if you have any conditions that may require antibiotics prior to procedures. (Example: recent joint implants)   Pregnancy: If you are pregnant make sure to notify the nursing staff. Not doing so may result in injury to the fetus, including death.  Sickness: If you have a cold, fever, or any active infections, call and cancel or reschedule your procedure. Receiving steroids while having an infection may result in complications. Arrival: You must be in the facility at least 30 minutes prior to your scheduled procedure. Tardiness: Your scheduled time is also the cutoff time. If you do not arrive at least 15 minutes prior to your procedure, you will be rescheduled.  Children: Do not bring any children with you. Make arrangements to keep them home. Dress appropriately: There is always a possibility that your clothing may get soiled. Avoid long dresses. Valuables: Do not bring any jewelry or valuables.  Reasons to call and reschedule or cancel your procedure: (Following these recommendations will minimize the risk of a serious complication.) Surgeries: Avoid having procedures within 2 weeks of any surgery. (Avoid for 2 weeks before or after any surgery). Flu Shots: Avoid having procedures within 2 weeks of a flu shots or . (Avoid for 2 weeks before or after immunizations). Barium: Avoid having a procedure within 7-10 days after having had a radiological study involving the use of radiological contrast. (Myelograms, Barium swallow or enema study). Heart attacks: Avoid any elective procedures or surgeries for the  initial 6 months after a Myocardial Infarction (Heart Attack). Blood thinners: It is imperative that you stop these medications before procedures. Let us  know if you if you take  any blood thinner.  Infection: Avoid procedures during or within two weeks of an infection (including chest colds or gastrointestinal problems). Symptoms associated with infections include: Localized redness, fever, chills, night sweats or profuse sweating, burning sensation when voiding, cough, congestion, stuffiness, runny nose, sore throat, diarrhea, nausea, vomiting, cold or Flu symptoms, recent or current infections. It is specially important if the infection is over the area that we intend to treat. Heart and lung problems: Symptoms that may suggest an active cardiopulmonary problem include: cough, chest pain, breathing difficulties or shortness of breath, dizziness, ankle swelling, uncontrolled high or unusually low blood pressure, and/or palpitations. If you are experiencing any of these symptoms, cancel your procedure and contact your primary care physician for an evaluation.  Remember:  Regular Business hours are:  Monday to Thursday 8:00 AM to 4:00 PM  Provider's Schedule: Eric Como, MD:  Procedure days: Tuesday and Thursday 7:30 AM to 4:00 PM  Wallie Sherry, MD:  Procedure days: Monday and Wednesday 7:30 AM to 4:00 PM Last  Updated: 03/02/2023 ______________________________________________________________________    ______________________________________________________________________    Update on Controlled Substance (Opioid) Regulations   To: All patients taking opioid pain medications. (I.e.: hydrocodone , hydromorphone , oxycodone , oxymorphone, morphine, codeine, methadone, tapentadol, tramadol , buprenorphine, fentanyl , etc.)  Re: Review on the state of controlled substance regulations.  Introduction: Rules and regulations associated with all aspects of controlled substances are constantly being modified. Unfortunately we have encountered patients questioning the veracity of the information that we provide them about these changes. This is intended to provide them with  appropriate references and a historical review of these changes.  A Brief History: As of January 06, 2016, the [place]  Government declared the opioid epidemic a public health emergency. Prescription drug monitoring programs (PDMPs) and the [facility] All Schedules Prescription Electronic Reporting Act (NASPER). Before 1800, clinicians regarded pain as an existential phenomenon, a consequence of aging. There was no regulation on the use of cocaine and opioids, resulting in widespread marketing and prescribing for many ailments ranging from diarrhea to toothache. The [employer] of [phone], passed in response to the sudden emergence of street heroin abuse as well as iatrogenic morphine dependence, influenced both physician and patient alike to avoid opiates. Patients with unexplained pain in the 1920s were regarded as deluded, malingering, or abusers, and cancer patients through the 1950s were encouraged to wean themselves off opioids until their lives "could be measured in weeks". Alongside this opioid evolution, the American Pain Society launched their influential "pain as the fifth vital sign" campaign in 1995. Concurrently, pharmaceutical companies introduced new formulations, such as extended release oxycodone  (OxyContin ). From 1997 to 2002, OxyContin  prescriptions increased from 670,000 to 6.2 million. However, concerns soon began to surface regarding overzealous opioid treatment. It must be noted that pharmaceutical companies contributed significantly to the rise of the opioid epidemic, receiving considerable reprimands as a consequence. In 2007, as the opioid epidemic began to inflict profound damage, [employer] pleaded guilty to federal charges related to the misbranding of OxyContin . [employer] agreed to pay a total of $634.5 million to resolve Justice Department investigations, as well as a $19.5 million settlement to [address] and the [address] of [place].  In response to the current epidemic,  changes in focus to the development of new abuse deterrent opioid formulations at the [place]  Food  and Drug Administration (FDA) as well as drafting of new public standards for pain treatment were created at [facility] in 02-12-16. In response to the opioid epidemic, FDA public policy changes were announced in February 2016. Among these new positions were a re-examination of the risk-benefit paradigm for opioids with strict emphasis on the large public health ramifications. The various modified opioids released over the past 20 years, such as tamper-resistant preparation, have had differing levels of success, and are collectively referred to as Risk Evaluation and Mitigation Strategies (REMS). There is also a growing focus on preventing opioid use disorder (OUD) and on offering affected individuals accessible and effective treatment. [place]  government policy reflects these changes and both the Affordable Care Act and the Mental Health Parity and Addiction Equity Act were major steps forward in treating opioid addiction. The Affordable Care Act, which was signed into law in 02/11/2009, with major provisions coming into effect by 02/11/13.  In the 1990s, the intensified marketing of newly reformulated prescription opioid medications (e.g., OxyContin ) and an influential pain advocacy campaign that encouraged greater pain management led to a precipitous rise in opioid use in the [place] . Research from the Centers for Disease Control and Prevention (CDC) shows that prescription opioid sales in the [place]  quadrupled from 02/11/1998 to 2009-02-11. At the same time, opioid misuse and opioid-involved overdose deaths increased (Figure 1). Between 1998/02/11 and 02-11-2009, the rate of opioidinvolved overdose deaths in the [place]  doubled from 2.9 to 6.8 deaths per 100,000 people. This initial rise in opioid-related deaths is often referred to as the first wave of the recent opioid crisis.  Between 02-11-98 and February 12, 2019, 565,000 Americans died  of opioid-involved overdoses. In turn, federal, state, and local governments responded with various legal and policy efforts to curb opioid misuse and drug-related overdose Deaths.  Recent Congresses have enacted several laws addressing the opioid crisis, such as the Comprehensive Addiction and Recovery Act of 2015-02-12 (CARA, P.L. 114-198); the 21st Century Cures Act (P.L. 114-255); the Substance UseDisorder Prevention that Promotes Opioid Recovery and Treatment for Patients and Communities Act (SUPPORT Act, P.L. [phone]); the Fentanyl  Sanctions Act (Title LXXII of P.L. [id number]); and the Blocking Deadly Fentanyl  Imports Act (P.L. 117-81, 6610). These laws addressed overprescribing and misuse of opioids, expanded substance use disorder prevention and treatment capacities, bolstered drug diversion capabilities, and enhanced international drug interdiction, counternarcotics cooperation, and sanctions efforts. Congress also directed additional funds to many of these initiatives through appropriations.  Congress provided funding in the [employer] Act of 12-Feb-2020 [phone]; P.L. 117-2) for syringe services programs (often known as needle exchange programs) and other harm reduction initiatives. Federal and state harm reduction strategies have frequently involved the distribution of naloxone (e.g., Narcan)--a medication used to reverse an opioid overdose--and test strips used to detect fentanyl  in drug samples.  The Department of Justice (DOJ) and Department of Homeland Security [facility]) aim to reduce the diversion of prescription opioids and the use, manufacturing, and trafficking of illicit opioids. DOJ--via the Drug Enforcement Administration (DEA)--regulates opioid manufacturers, distributors, and dispensers; it also controls the opioid supply through enforcement of regulatory requirements.  A History of Opiate Laws in the [place]   Prior to 02/11/1889, laws concerning opiates were  strictly imposed on a local city or state-by-state basis. One of the first was in [place] in 02/11/1874 where it became illegal to smoke opium only in opium dens. It did not ban the sale, import or use otherwise. In the next 25 years different states enacted  opium laws ranging from outlawing opium dens altogether to making possession of opium, morphine and heroin without a physician's prescription illegal.  The first Congressional Act took place in 1890 that levied taxes on morphine and opium. From that time on the [employer] has had a series of laws and acts directly aimed at opiate use, abuse and control. These are outlined below:  1906 - Pure Food and Drug Act Preventing the manufacture, sale, or transportation of adulterated or misbranded or poisonous or deleterious foods, drugs, medicines, and liquors, and for regulating traffic therein, and for other purposes. Punishment included fines and prison time.  1909   - Smoking Opium Exclusion Act Banned the importation, possession and use of smoking opium. Did not regulate opium-based medications. First Freight forwarder banning the non-medical use of a substance.  1914  - The Margrette Act In summary, The Margrette Act of 1914 was written more to have all parties involved in importing, exporting, Set designer and distributing opium or cocaine to register with the [employer] and have taxes levied upon them. Exempt from the law were physicians operating "in the course of his professional practice"  1919 - Supreme Court ratified the [employer] in [place] et al., v. [place]  and [place]  v. [place], then again in [facility] v. [place] , in 1920, holding that doctors may not prescribe maintenance supplies of narcotics to people addicted to narcotics. However, it does not prohibit doctors from prescribing narcotics to wean a patient off of the drug. It was also the opinion of the court that prescribing narcotics to  habitual users was not considered "professional practice" hence it then was considered illegal for doctors to prescribe opioids for the purposes of maintaining an addiction. It can be argued that today's addiction medications are not intended to maintain an addiction but to facilitate addiction remission. In which case, this opinion of the court should not preclude practitioners from prescribing buprenorphine or methadone to patients suffering from an addictive disorder.  1924  - Heroin Act Architectural technologist, importation and possession of heroin illegal - even for medicinal use.  1922 -- Narcotic Drug Import and Export Act Enacted to assure proper control of importation, sale, possession, production and consumption of narcotics.  1927  -- Special educational needs teacher of Prohibition [employer] of Prohibition was responsible for tracking bootleggers and organized Conservation officer, historic buildings. They focused primarily on interstate and international cases and those cases where local law enforcement official would not or could not act.  1932 -- Uniform State Narcotic Act Encouraged states to pass uniform state laws matching the federal Narcotic Drug Import and Export Act. Suggested prohibiting cannabis use at the state level.  17 -- Food, Drug, and Cosmetic Act The new law brought cosmetics and medical devices under control, and it required that drugs be labeled with adequate directions for safe use. Moreover, it mandated pre-market approval of all new drugs, such that a manufacturer would have to prove to FDA that a drug were safe before it could be sold  1951 -- Boggs Act Imposed maximum criminal penalties for violations of the import/export and internal revenue laws related to drugs and also established mandatory minimum prison sentences.  1956 -- Narcotics Control Act Increased Boggs Act penalties and mandatory prison sentence minimums for violations of existing drug laws.  1965 -- Drug Abuse Control Amendment Enacted to  deal with problems caused by abuse of depressants, stimulants and hallucinogens. Restricted research into psychoactive drugs such as LSD by requiring FDA approval.  1970 -- Controlled Substance Act  Controlled Substances Import and Export Act These laws are a consolidation of numerous laws regulating the manufacture and distribution of narcotics, stimulants, depressants, hallucinogens, anabolic steroids, and chemicals used in the illicit production of controlled substances. The CSA places all substances that are regulated under existing federal law into one of five schedules. This placement is based upon the substance's medicinal value, harmfulness, and potential for abuse or addiction. Schedule I is reserved for the most dangerous drugs that have no recognized medical use, while Schedule V is the classification used for the least dangerous drugs. The act also provides a mechanism for substances to be controlled, added to a schedule, decontrolled, removed from control, rescheduled, or transferred from one schedule to another.  107 - Drug Enforcement Agency By Executive Order, the DEA was formed to take place of the [employer] of Narcotics and Dangerous Drugs.  78 -Narcotic Addict Treatment Act of  1974  - Public Law [phone] Amends the Controlled Substance Act of 1970 to provide for the registration of practitioners conducting narcotic treatment programs. [methadone clinics] It also provides legal definitions for the phrases "maintenance treatment" and "detoxification treatment".  1986 -- Anti-Drug Abuse Act of 1986 Strengthened Federal efforts to encourage foreign cooperation in eradicating illicit drug crops and in halting international drug traffic, to improve enforcement of Federal drug laws and enhance interdiction of illicit drug shipments, to provide strong Market researcher in establishing effective drug abuse prevention and education programs, to [employer] support for drug abuse treatment  and rehabilitation efforts, and for other purposes. It also re-imposed mandatory sentencing minimums depending on which drug and how much was involved.  1988 -- Anti-Drug Abuse Act of 1988 Established the Office of Materials engineer (ONDCP) in the [employer] of the Economist; authorized funds for [employer], state and local drug enforcement activities, school-based drug prevention efforts, and drug abuse treatment with special emphasis on injecting drug abusers at high risk for AIDS.  2000 -- Federal - The Drug Addiction Treatment Act of 2000 (DATA 2000) It enables qualified physicians to prescribe and/or dispense narcotics for the purpose of treating opioid dependency. For the first time, physicians are able to treat this disease from their private offices or other clinical settings. This presents a very desirable treatment option for those who are unwilling or unable to seek help in drug treatment clinics. Patients can now be treated in the privacy of their doctor's office, as are other people being treated for any other type of medical condition. One medicine doctors may now prescribe is Buprenorphine. The major downfall of this Act is the limitation of 30 patients per practice - which means that large facilities, no matter how many physicians are there, can only treat 30 patients at a time.  2002-- DEA reschedules buprenorphine from a schedule V drug to a schedule III drug, on December 27, 2000 - the day before the FDA approval of Suboxone and Subutex despite overwhelming objection by the medical community.  2004: June 2004 THE CONFIDENTIALITY OF ALCOHOL AND DRUG ABUSE PATIENT RECORDS REGULATION AND THE HIPAA PRIVACY RULE:  Confidentiality of Alcohol and Drug Dependence Patient Records (summary) Code of Federal Regulations Title 42 Part 2 (42 CFR Part 2)  The confidentiality of alcohol and drug dependence patient records maintained by this practice/program is protected by federal law  and regulations. Generally, the practice/program may not say to a person outside the practice/program that a patient attends the practice/program, or disclose any information identifying  a patient as being alcohol or drug dependent unless:  The patient consents in writing; The disclosure is allowed by a court order, or The disclosure is made to medical personnel in a medical emergency or to qualified personnel for research,  audit, or practice/program evaluation. Violation of the federal law and regulations by a practice/program is a crime. Suspected violations may be reported to appropriate authorities in accordance with federal regulations. Freight forwarder and regulations do not protect any information about a crime committed by a patient either at the practice/program or against any person who works for the practice/program or about any threat to commit such a crime. Federal laws and regulations do not protect any information about suspected child abuse or neglect from being reported under state law to appropriate state or local authorities.  sample consent form (MS-WORD)  2005: 10-23-2003 Public law [phone], Amends the Controlled Substances Act to eliminate the 30-patient limit for medical group practices allowed to dispense narcotic drugs in schedules III, IV, or V for maintenance or detoxification treatment (retains the 30-patient limit for an individual physician). This amendment removes the 30-patient limit on group medical practices that treat opioid dependence with buprenorphine. The restriction was part of the original Drug Addiction Treatment Act of 2000 (DATA) that allowed treatment of opioid dependence in a doctor's office. With this change, every certified doctor may now prescribe buprenorphine up to his or her individual physician limit of 30 patients.  2006: On 03/20/2005 President Levy signed Bill H.R.6344 into law. This allows physicians who have been certified to prescribe certain drugs for  the treatment of opioid dependence under DATA2000 to treat up to 100 patients (up from 30) by submitting an intent notification to the Dept of Health and [employer]. This is a major step forward in both fighting the stigma and allowing access to treatment previously not available to some. For more details see 30/100-PATIENT LIMIT  2016: HHS augments regulations concerning the 30/100 patient limit by raising the limit to 275 for qualifying physicians. Link to summary of regulation  2016: Comprehensive Addiction and Recovery Act of 2016 (sec.303) amends the Controlled Substance Act - to allow Nurse Practitioners and Physician Assistants to become eligible to prescribe buprenorphine for the treatment of opioid use disorder. See the entire law for more details.  The roots of the concurrent regulation of certain drugs under two statutory schemes go back to the beginning of this century. In 1906, Congress enacted the Pure Food and Drug Act, establishing one regime of regulation to assure (among other things) that drugs were not adulterated or misbranded. These regulations were amended several times, recodified in 1938, and expanded on again from the 1940s through the 1990s. Their implementation and enforcement is today assigned to the Food and Drug Administration (FDA) in the Department of Health and Human Services [facility]).  In 1914, Congress adopted the [place] Narcotic Act to stop abuse of addictive drugs. The Margrette Narcotic Act was amended in 1937 to include marijuana. In 1965, amphetamines, barbiturates, and hallucinogens came under regulation, but under the [employer], Drug, and Cosmetic Act. In 1970, these various statutes were consolidated and recodified as the Controlled Substances Act (CSA), which has been amended several times since then. Its implementation and enforcement is today assigned to the Drug Enforcement Administration (DEA) in the Department of Justice.  The first clash occurred  after World War I, when so-called morphine clinics existed and physicians prescribed or dispensed morphine to addicts. Some addicts were veterans of the American  Civil War, the Spanish-American War, and WWI, who had become addicted during treatment for war wounds, but most of them came from the growing population of nonmedical addicts ([place], [id number]). The Narcotics Division of the Prohibition Unit of the Department of the Treasury, which was then responsible for enforcing the [facility] Narcotic Act, concluded that this activity was not the legitimate practice of medicine but simple drug trafficking. The Treasury Department swiftly closed the clinics and made it personally and professionally risky for physicians to maintain a narcotic addict for any reason. In did so, however, only after the American Medical Association had adopted a resolution, in 1920, opposing ambulatory clinics''.  In 1972, the public health establishment, including the Secretary of Health, Education, and Welfare, the Education officer, environmental, the [employer] of [employer], and the Chemical engineer for Drug Abuse Prevention, was unprepared to allow [employer] of Narcotics and Dangerous Drugs, DEA's predecessor agency, to unilaterally define the parameters of medical practice for the use of methadone in the treatment of heroin addiction. As a consequence, a new set of rules--the third, on top of the FDA and DEA schemes--was added, one that inserted FDA deeply into the practice of medicine, notwithstanding its protestations to the contrary. Congress ratified this joint responsibility of law enforcement and public health officials for methadone through this third set of rules in 1974 with the passage of the Narcotic Addict Treatment Act (NATA). To examine in detail the evolution of this third set of rules--commonly referred to as the FDA or DHHS methadone regulations--we turn, first, to the period of the  mid-1960s.  Increased use of heroin in the post World War II period first became apparent in the early to mid 1950s. During the [employer], a minimum mandatory narcotics law was enacted in 1956, effective July 1957. 1962 [facility] conference on drug abuse, the [employer] on Narcotic and Drug Abuse (the [employer]) of 1963, the Drug Abuse Control Amendments of 1965, the President's Commission on [employer] and Administration of Justice (the [employer]) of [phone], and the Narcotic Addiction Rehabilitation Act of 1966.  The 1965 Drug Abuse Control Amendments brought under strict federal control all nonnarcotic drugs capable of producing serious psychotoxic effects when abused. This act also created the [employer] of Drug Abuse Control within the Department of Health, Education, and Welfare (DHEW) and shifted the basis for [employer] of illegal drugs from tax principles (administered by the Department of Treasury) to the regulation of commerce (administered by the [employer]X [employer]).  The 1966 Narcotic Addiction Rehabilitation Act Tour manager) authorized the civil commitment of narcotic addicts, and federal assistance to state and local governments to develop a local system of drug treatment programs. With respect to the latter, the [employer] of Mental Health [facility]) initially proposed the gradual implementation of the state assistance effort, mainly through a common mental health mechanism--inpatient treatment programs. However, because of a perceived pressing need, the courts began to commit addicts to these programs even before they were officially opened or staffed. The NARA legislation imposed the following contract requirements on treatment centers: (1) thrice-a-week counseling sessions; (2) weekly urine tests; (3) restorative dental services; (4) psychological consultations and vocational training; and (5) the treatment modalities of drug-free  outpatient, therapeutic community, and methadone maintenance. Reorganization Plan No. 1 of 1968 transferred the primary functions of the [employer] of Narcotics (FBN) from the [employer] to the Department of Justice; it also transferred the [employer] of Drug Abuse Control  functions to the Department of Justice. Within the [employer], the [employer] of Narcotics and Dangerous Drugs (BNDD) was created, which became the Drug Enforcement Administration in 1973.   Under the first Lapel administration [phone]), federal drug abuse policy developed in a significant way. These developments included a 1969 war on drugs presidential message, resulting legislation in 1970, and a Special Action Office created by executive order in 1971 and authorized in statute in 1972. Brynn, in 1969, to send a message to Congress on drug abuse. Although this was the first time that a U.S. president invoked the war on drugs image, it was in retrospect the most balanced approach to the problem of drug abuse that had been advanced. The 1969 message resulted in the submission of legislation to the Congress and the passage, the following year, of the Comprehensive Drug Abuse Prevention and Control Act of 1970 [employer] [phone], January 16, 1969). The act dealt with research, treatment, and prevention of drug abuse and drug dependence, and with drug abuse Charity fundraiser. One major purpose of the 1970 legislation was to reverse some of the strictures of the [employer] of 1914. The 1970 act sought to clarify for the medical profession . . . the extent to which they may safely go in treating narcotic addicts as patients. Title I, in Section IV, charged the Surveyor, minerals, Education, and Welfare, to determine the appropriate methods of professional practice in the medical treatment of the narcotic addiction of various classes of narcotic addicts. This provision constitutes the initial statutory basis for  treatment standards. The law enforcement sections consolidated all prior federal statutes into the Controlled Substances Act and the Controlled Substances Export and Import Act (Titles II and III, respectively, of the Comprehensive Drug Abuse Prevention and Control Act of 1970). Under this legislation, substances were classified under five schedules according to their abuse potential, and psychological and physical effects. Methadone was placed in Schedule II, along with such opiate drugs as morphine, codeine, and hydrocodone .  One of the most important steps taken by President Brynn was to establish in June 1971 the Special Action Office for Drug Abuse Prevention (SAODAP) in the [employer] of the President ([place] [phone], September 06, 1969). In mid-1971, [facility] appointed Dr. Maple Dunnings as SAODAP director. Within a year, the Drug Abuse Prevention Office and Treatment Act of 1972 [employer] [phone], June 11, 1970) gave statutory authority to [facility], but limiting setting, on September 19, 1973, as the limit on its existence.  The purpose of the 1972 act was to bring the resources of the federal government to bear on drug abuse with the immediate objective of significantly reducing its incidence and developing a comprehensive, coordinated long-term federal strategy to combat drug abuse.  Narcotic Addict Treatment Act [employer]) of 1974 [employer] [phone]), which amended the Controlled Substances Act. This legislation was driven by concern for the diversion of methadone to illicit channels that was occurring in 1972 and 1973, as reflected in the title of the Senate bill adopted on August 29, 1971, the Methadone Diversion Control Act of 1973. (U.S. Senate, 1970a, 8029a).  The 1980 final rule (45 FR 37305, December 10, 1978) reduced the minimum standard for admission from two years of addiction to one year coupled with a clinical determination that the individual was currently physiologically.  The  regulations were next revised in 1989, following two proposals to modify them, one in 1983 and one in 1987.  Under President Tanda Corrente, a  government-wide effort was made to review all federal government regulations and to eliminate or reduce the burden of these regulations on the private sector, state and [employer], and [employer].   The 1983 recommendations, though not adopted, did initiate another revision of the methadone regulations, which first found expression in a 1987 proposed rule (52 FR 37047, December 22, 1985) and culminated in a final rule (54 FR 8954, May 23, 1987) at the end of the decade. In the 1987 proposed rule, the FDA and NIDA, in an effort to put the best face on the unenthusiastic 1983 response by the provider community to converting the regulations to guidelines, indicated that they had retained the current requirements necessary to achieve the goals of the 1974 NATA, but were proposing to streamline the regulation and to promote more efficient operation of methadone programs. The 1987 proposed rule, issued by the FDA and NIDA, advanced the following changes in the methadone regulation: that detoxification treatment be divided into short-term (<21 days) and long-term (>21 and <180 days) treatment; that the minimum staffing ratio of one counselor to 50 patients be eliminated; that blood tests be allowed as ways to conduct initial drug screening or to meet the monthly testing requirements for six-day take-home patients; that the 72-hour notification of FDA and the pertinent state authority for methadone doses greater than 100 mg be eliminated; that special adverse reaction reporting requirements for methadone be eliminated and reliance placed upon general FDA reporting requirements; that a supervising counselor be allowed to conduct the annual review of the patient's treatment plan for certain qualified patients who had been in treatment for 3 years or  longer; and that the requirement of an annual report of methadone treatment programs to the FDA be dropped. The FDA and NIDA issued a final rule on May 23, 1987, based on comments on the 1987 proposed (54 FR 8954). Concurrently, FDA and NIDA issued a six-page guidance document, which noted that the regulations, over time, had recommended certain practices that were not actually required. Public Health Service, in Congress, and elsewhere, to reorganize the Alcohol, Drug Abuse, and Mental Health Administration (ADAMHA). These efforts culminated in the [employer] of 1992 [employer] [phone], September 30, 1990), the main purpose of which was to transfer the research portions of the three ADAMHA institutes--NIDA, the [employer] Mills of Alcoholism and Alcohol Abuse, and the [employer] of Mental Health--to the [employer] and to create the Substance Abuse and Museum/gallery exhibitions officer [facility] Hights) as the home for the service functions of these entitles.  Guidelines for Opioid Treatment The Federal Guidelines for Opioid Treatment Programs - 2015 serve as a guide to accrediting organizations for developing accreditation standards. The guidelines also provide OTPs with information on how programs can achieve and maintain compliance with federal regulations. The 2015 guidelines are an update to the 2007 Guidelines for the Accreditation of Opioid Treatment Programs (PDF  547 KB). The new document reflects the obligation of OTPs to deliver care consistent with the patient-centered, integrated, and recovery-oriented standards of substance use treatment.  DPT oversees the certification of OTPs and provides guidance to nonprofit organizations and state governmental entities that want to become a SAMHSA-approved accrediting body. Learn more about the accreditation and certification of OTPs and [employer] oversight of OTP accreditation bodies.  Model Guidelines for [facility] Boards With input from [facility], the Federation of [employer] in 2013 adopted a revised version of the federation's office-based opioid treatment policies. The  Model Policy on DATA 2000 and Treatment of Opioid Addiction in the Medical Office - 2013 (PDF  279 KB) provides model guidelines for use by state medical boards in regulating office-based opioid treatment.  Holiday Guidance for Opioid Treatment Programs (PDF  203 KB) In response to requests for the upcoming federal holidays and ensuing weekends (December 24th, 25th, and 26th and December 31st, Jan 1st, and Jan 2nd), this letter is to provide guidance regarding requests for unsupervised doses of medication for patients for these dates. View a sample [id number] (PDF  194 KB).  Federal regulation of drugs emerged as early as 34, under a law that addressed only imported drugs. In 1905 the [employer] launched a private, voluntary means of controlling a substantial part of the drug marketplace, a system that remained in place for over a half-century. Drug regulation in FDA has evolved considerably since President Ricardo Para signed the 1906 Pure Food and Drugs Act.  1820 Eleven doctors set up the U.S. Pharmacopeia and record the first list of standard drugs. 1848 Drug Importation Act passed by Congress requires U.S. Customs Service inspection to stop entry of tainted, low quality drugs from overseas. 8116 Dr. Mitchell MICAEL Burrs becomes the chief chemist at the [facility] [place] adulteration studies.  1905 The American Medical Association [facility]) begins a voluntary program of drug approval that would last until 1955. In order to advertise in the [facility] and related journals, drug companies must show proof that the drug will treat what they claim. 1906 The original Food and Drug Act is passed by Congress on June 30 and signed by [employer]. The Act outlaws states from buying and  selling food, drinks, and drugs that have been mislabeled and tainted. 1911 In U.S. v. Vicci, the [employer] that the [employer] and Drugs Act does not outlaw false medical claims but only false and misleading statements about the ingredients or identity of a drug. 1912 Congress passes the [place] Amendment to overcome the ruling in U.S. v. Vicci. The Act outlaws labeling medicines with fake medical claims that is meant to trick the buyer. 1930 The name of the Food, Drug, and Insecticide Administration is shortened to Food and Drug Administration (FDA) under an Therapist, music. 1933 FDA recommends a total rewrite of the out-of-date 1906 Food and Drugs Act.   1937 Elixir Sulfanilamide, contain the poisonous liquid, diethylene glycol, kills 107 persons, many of whom are children, dramatizing the need to establish drug safety before marketing and to pass the pending food and drug law. 1938 Congress passes [employer] Inc, Drug, and Cosmetic (FDC) Act of 1938, which requires that new drugs show safety before selling. This starts a new system of drug regulation. The Act also requires that safe limits be set for unavoidable poisonous matter and allows for factory inspections. The [employer] is given power to oversee advertising for all FDAregulated products except prescription drugs. FDA states that sulfanilamide and other dangerous drugs must be given under the direction of a medical expert. This begins the requirement for prescription only (nonnarcotic) drugs (see 1951 [place]rey amendment). 1941 Nearly 300 deaths and injuries result from the use of sulfathiazole tablets, an antibiotic, tainted with the sedative, phenobarbital. In response, FDA drastically changes manufacturing and quality controls. These changes lead to the development of good manufacturing practices (GMPs). 1948 The [employer] in U.S. v. Floretta that FDA  jurisdiction extends to retail stores, thereby allowing FDA to stop illegal sales  of drugs by pharmacies including barbiturates and amphetamines. 1950 In Walgreen. v. U.S., a U.S. Court of Appeals rules that the directions for use on a drug label must include the drug's purpose. 1951 Congress passes the [place]-Humphrey Amendment, which defines the kinds of drugs that cannot be used safely without medical supervision. The amendment limits sale of these drugs to prescription only by a medical professional. All other drugs are to be available without a prescription. 1952 A nationwide investigation by FDA reveals that chloramphenicol, an antibiotic, caused nearly 180 cases of often deadly blood diseases. Two years later FDA engages the [employer] of Hospital Pharmacists, the American Association of Medical Record Librarians, and later the American Medical Association in a voluntary program of drug reaction reporting. 1953 The [employer] Amendment clarifies previous law and requires FDA to give manufacturers written reports of conditions seen during inspections and results of factory samples. 1962 Thalidomide, a new sleeping pill, causes severe birth defects of the arms and legs in thousands of babies born in [place]. The U.S. media reports on how Dr. Cathlean Mort, a FDA medical officer, helped prevent approval and marketing of Thalidomide in the [place] . These reports stirred up public support for stronger drug laws. 3 Congress passes the [employer]. For the first time, these laws require drug makers to prove their drug works before FDA can approve them for sale. The Advisory Committee on Investigational Drugs meets for the first time. This was the first meeting of a committee to advise FDA on product approval and policy on an ongoing basis. 1966 FDA contracts with the [employer] of [employer]  to measure the effectiveness of 4,000 marketed drugs approved on the basis of safety alone between [phone] and 02-06-1961. The Fair Packaging and Labeling Act requires all consumer products, in interstate commerce, to be honestly and informatively labeled. Feb 07, 1967 FDA forms the Drug Efficacy Study Implementation (DESI) to carry out recommendations of the [employer] of the effectiveness of drugs first sold between [place] and 11/17/621970/11/17 FDA requires the first patient package insert, medicines must come with information for the patient about risks and benefits. 1972 Over-the-Counter Drug Review begins to enhance the safety, effectiveness and appropriate labeling of drugs sold without prescription. 1973 The U.S. Supreme Court upholds the [place] drug effectiveness law and approves FDA's action to control entire classes of products. 1982 FDA issues Tamper-resistant Packaging Regulations to prevent poisonings such as deaths from cyanide placed in Tylenol  capsules. Congress passes the [employer] in 02/06/1982, making it a crime to tamper with packaged consumer products. 02-07-83 Drug Price Advertising account planner Act (Hatch-Waxman Act) increases the availability of less costly generic drugs by allowing FDA to approve applications for generic versions of brand-name drugs without repeating the research that proved the safety and effectiveness of the brand-name drugs. The Act also allowed brand-name companies to apply for up to five years additional patent protection for the new medicines they developed to make up for time lost while their products were going through FDA's approval process. 1989 The FDA issued guidelines asking drug makers to decide if a drug is likely to have usefulness in elderly people and to include elderly people in studies when applicable. 1991 In 1980/02/07, the FDA and the Department of Health and Human Services published a policy on  protecting people in research. In Feb 06, 1990, this policy is adopted by more than a dozen federal agencies involved in human  subject research and becomes known as the Common Rule. 4 1993 FDA launches MedWatch, a system designed to collect reports from health professionals on problems with drugs and other medical products. FDA issues guidelines for measuring gender differences in responses to medication. Drug companies are encouraged to include patients of both sexes in their research of drugs and to study any gender-specific effects. 1995 FDA declares cigarettes to be drug delivery devices. Limits are issued on marketing and sales to reduce smoking by young people. 1998 FDA introduces the Adverse Event Reporting System (AERS), a computerized database designed to store and study safety reports on already marketed drugs.  The Demographic Rule requires that a marketing application review data on safety and effectiveness by age, gender, and race. The Pediatric Rule requires drug makers of selected new and existing drugs to conduct studies on drug safety and effectiveness in children. 1999 Creation of the Drug Facts Label for OTC drug products. The law requires all overthe-counter drug labels to have information in a standard format. These drug facts labels are designed to give the user easy-to-find information. 2000 The U. S. [employer], upholds an earlier decision from The [employer] and Drug Administration v. Delores [employer]. et al. and rules 5-4 that FDA does not have authority to regulate tobacco as a drug. 2002 The Best Pharmaceuticals for Children Act, in exchange for studying the drug in children, the drug maker gets six months of selling their product without competition. 2003 The Pediatric Research Equity Act gives FDA the right to ask drug companies to study the effectiveness of new drugs in children. 2004 FDA advises medical professionals to limit the use of a pain  reliever called Cox-2, a nonsteroidal anti-inflammatory drug (NSAIDs). Studies had shown that long-term use raised chances of heart attacks and strokes. The warning is also added to the over-thecounter NSAIDs' Drug Facts label. Medicines used in hospitals must have a bar code to prevent patients from receiving the wrong medicine. 5 2005 The Drug Safety Board is formed, consisting of FDA staff and representatives from the [employer] of [address] and the CIG[employer]. The Board advises the Director, Center for Drug Evaluation and Research, FDA, on drug safety issues and works with the agency in sharing safety information to health professionals and patients.  The [place]  Food and Drug Administration (FDA) was first created to enforce the Pure Food and Drug Act of 1906. In this capacity, the FDA is charged with protecting the health of the US  public, to ensure the quality of its food, medicine, and cosmetics. Before this time, the [place]  government had no formal oversight of these products and left issues of quality and purity to the individual manufactures, or at times, individual states.    Review: [place] Stop ACT. (The Strengthen Opioid Misuse Prevention (STOP) Act of 2017). GENERAL ASSEMBLY OF [place]  SESSION 2017 SESSION LAW 2017-74 HOUSE BILL 243  PMP mandatory The dispenser shall report: (1) The dispenser's DEA number. (2) The name of the patient for whom the controlled substance is being dispensed, and the patient's: a. Full address, including city, state, and zip code, b. Telephone number, and c. Date of birth. (3) The date the prescription was written. (4) The date the prescription was filled. (5) The prescription number. (6) Whether the prescription is new or a refill. (7) Metric quantity of the dispensed drug. (8) Estimated days of supply of dispensed drug, if provided to the dispenser. (9) National Drug Code of dispensed drug. (10)  Prescriber's DEA number. (11) Method of payment for the prescription.  No paper prescriptions  Duration of scripts Acute vs Chronic prescribing  2016 CDC Guidelines for prescribing Opioids for Chronic Pain. (Updated in 2022.) Medical Board  Laws:  Prescription Laws Drug laws, rules, and regulations are constantly changing. Any attempt to summarize them would quickly become outdated. Because of that, the Board encourages practitioners who seek guidance on prescribing procedures to refer to the sources listed below in addition to the Board's position statements, rules and Medical Practice Act.  [place]  Board of Pharmacy (NCBOP) (which offers the state's pharmacy laws and rules, and links to the Code of Federal Regulations) [employer] Site: www.ncbop.org  [place]  General Statutes General Web Site: PoliticalPool.cz See: [place]  Food, Drug, and Cosmetic Act: [id number] & 106-134 See: [place]  Pharmacy Practice Act, Article 4A: [phone] See: [place]  Controlled Substances Act, Article 5: 90-86 & 90-113.8 See: Use of controlled substances to render one mentally incapacitated or physically helpless: [employer] Care. Code, Title 21, Food & Drugs www.deadiversion.usdoj.gov Controlled Substances Schedules www.deadiversion.usdoj.gov Drug Warehouse manager - www.deadiversion.usdoj.gov 42 CFR  8.12 - Federal opioid treatment standards.   Effective November 17, 2015, prior approval will be required for opioid analgesic doses for [facility]. Medicaid and [place] [facility]) beneficiaries which:  Exceed 120 mg of morphine equivalents (MME) per day  Are greater than a 14-day supply of any opioid, or,  Are non-preferred opioid products on the [place] Medicaid Preferred Drug List (PDL)  [place] 42 CFR  8.12 - Federal opioid treatment standards. Title II of the Comprehensive Drug Abuse Prevention and Control Act of 1970, commonly  known as the Controlled Substance Act (CSA) Title 21 [place] Controlled Substances Act.   Reference:   ______________________________________________________________________       ______________________________________________________________________    Medication Rules  Purpose: To inform patients, and their family members, of our medication rules and regulations.  Applies to: All patients receiving prescriptions from our practice (written or electronic).  Pharmacy of record: This is the pharmacy where your electronic prescriptions will be sent. Make sure we have the correct one.  Electronic prescriptions: In compliance with the [place]  Strengthen Opioid Misuse Prevention (STOP) Act of 2017 (Session Law 2017-74/H243), effective March 23, 2018, all controlled substances must be electronically prescribed. Written prescriptions, faxing, or calling prescriptions to a pharmacy will no longer be done.  Prescription refills: These will be provided only during in-person appointments. No medications will be renewed without a face-to-face evaluation with your provider. Applies to all prescriptions.  NOTE: The following applies primarily to controlled substances (Opioid* Pain Medications).   Type of encounter (visit): For patients receiving controlled substances, face-to-face visits are required. (Not an option and not up to the patient.)  Patient's Responsibilities: Pain Pills: Bring all pain pills to every appointment (except for procedure appointments). Pill counts are required.  Pill Bottles: Bring pills in original pharmacy bottle. Bring bottle, even if empty. Always bring the bottle of the most recent fill.  Medication refills: You are responsible for knowing and keeping track of what medications you are taking and when is it that you will need a refill. The day before your appointment: write a list of all prescriptions that need to be refilled. The day of the  appointment: give the list to the admitting nurse. Prescriptions will be written only during appointments. No prescriptions will be written on procedure days. If you forget a medication:  it will not be Called in, Faxed, or electronically sent. You will need to get another appointment to get these prescribed. No early refills. Do not call asking to have your prescription filled early. Partial  or short prescriptions: Occasionally your pharmacy may not have enough pills to fill your prescription.  NEVER ACCEPT a partial fill or a prescription that is short of the total amount of pills that you were prescribed.  With controlled substances the law allows 72 hours for the pharmacy to complete the prescription.  If the prescription is not completed within 72 hours, the pharmacist will require a new prescription to be written. This means that you will be short on your medicine and we WILL NOT send another prescription to complete your original prescription.  Instead, request the pharmacy to send a carrier to a nearby branch to get enough medication to provide you with your full prescription. Prescription Accuracy: You are responsible for carefully inspecting your prescriptions before leaving our office. Have the discharge nurse carefully go over each prescription with you, before taking them home. Make sure that your name is accurately spelled, that your address is correct. Check the name and dose of your medication to make sure it is accurate. Check the number of pills, and the written instructions to make sure they are clear and accurate. Make sure that you are given enough medication to last until your next medication refill appointment. Taking Medication: Take medication as prescribed. When it comes to controlled substances, taking less pills or less frequently than prescribed is permitted and encouraged. Never take more pills than instructed. Never take the medication more frequently than prescribed.   Inform other Doctors: Always inform, all of your healthcare providers, of all the medications you take. Pain Medication from other Providers: You are not allowed to accept any additional pain medication from any other Doctor or Healthcare provider. There are two exceptions to this rule. (see below) In the event that you require additional pain medication, you are responsible for notifying us , as stated below. Cough Medicine: Often these contain an opioid, such as codeine or hydrocodone . Never accept or take cough medicine containing these opioids if you are already taking an opioid* medication. The combination may cause respiratory failure and death. Medication Agreement: You are responsible for carefully reading and following our Medication Agreement. This must be signed before receiving any prescriptions from our practice. Safely store a copy of your signed Agreement. Violations to the Agreement will result in no further prescriptions. (Additional copies of our Medication Agreement are available upon request.) Laws, Rules, & Regulations: All patients are expected to follow all [address] and [employer], Rules, [place] [employer]. Ignorance of the Laws does not constitute a valid excuse.  Illegal drugs and Controlled Substances: The use of illegal substances (including, but not limited to marijuana and its derivatives) and/or the illegal use of any controlled substances is strictly prohibited. Violation of this rule may result in the immediate and permanent discontinuation of any and all prescriptions being written by our practice. The use of any illegal substances is prohibited. Adopted CDC guidelines & recommendations: Target dosing levels will be at or below 60 MME/day. Use of benzodiazepines** is not recommended. Urine Drug testing: Patients taking controlled substances will be required to provide a urine sample upon request. Do not void before coming to your medication management appointments. Hold  emptying your bladder until you are admitted. The admitting nurse will inform you if a sample is required. Our practice reserves the  right to call you at any time to provide a sample. Once receiving the call, you have 24 hours to comply with request. Not providing a sample upon request may result in termination of medication therapy.  Exceptions: There are only two exceptions to the rule of not receiving pain medications from other Healthcare Providers. Exception #1 (Emergencies): In the event of an emergency (i.e.: accident requiring emergency care), you are allowed to receive additional pain medication. However, you are responsible for: As soon as you are able, call our office [phone], at any time of the day or night, and leave a message stating your name, the date and nature of the emergency, and the name and dose of the medication prescribed. In the event that your call is answered by a member of our staff, make sure to document and save the date, time, and the name of the person that took your information.  Exception #2 (Planned Surgery): In the event that you are scheduled by another doctor or dentist to have any type of surgery or procedure, you are allowed (for a period no longer than 30 days), to receive additional pain medication, for the acute post-op pain. However, in this case, you are responsible for picking up a copy of our Post-op Pain Management for Surgeons handout, and giving it to your surgeon or dentist. This document is available at our office, and does not require an appointment to obtain it. Simply go to our office during business hours (Monday-Thursday from 8:00 AM to 4:00 PM) (Friday 8:00 AM to 12:00 Noon) or if you have a scheduled appointment with us , prior to your surgery, and ask for it by name. In addition, you are responsible for: calling our office [phone], at any time of the day or night, and leaving a message stating your name, name of your surgeon, type of  surgery, and date of procedure or surgery. Failure to comply with your responsibilities may result in termination of therapy involving the controlled substances.  Consequences:  Non-compliance with the above rules may result in permanent discontinuation of medication prescription therapy. All patients receiving any type of controlled substance is expected to comply with the above patient responsibilities. Not doing so may result in permanent discontinuation of medication prescription therapy. Medication Agreement Violation. Following the above rules, including your responsibilities will help you in avoiding a Medication Agreement Violation ("Breaking your Pain Medication Contract").  *Opioid medications include: morphine, codeine, oxycodone , oxymorphone, hydrocodone , hydromorphone , meperidine , tramadol , tapentadol, buprenorphine, fentanyl , methadone. **Benzodiazepine medications include: diazepam  (Valium ), alprazolam  (Xanax ), clonazepam  (Klonopine), lorazepam  (Ativan ), clorazepate (Tranxene), chlordiazepoxide (Librium), estazolam (Prosom), oxazepam (Serax), temazepam (Restoril), triazolam (Halcion) (Last updated: 01/13/2023) ______________________________________________________________________     ______________________________________________________________________    Preparing for your procedure  Appointments: If you think you may not be able to keep your appointment, call 24-48 hours in advance to cancel. We need time to make it available to others.  Procedure visits are for procedures only. During your procedure appointment there will be: NO Prescription Refills*. NO medication changes or discussions*. NO discussion of disability issues*. NO unrelated pain problem evaluations*. NO evaluations to order other pain procedures*. *These will be addressed at a separate and distinct evaluation encounter on the provider's evaluation schedule and not during procedure  days.  Instructions: Food intake: Avoid eating anything solid for at least 8 hours prior to your procedure. Clear liquid intake: You may take clear liquids such as water  up to 2 hours prior to your procedure. (No carbonated drinks. No soda.)  Transportation: Unless otherwise stated by your physician, bring a driver. (Driver cannot be a Market researcher, Pharmacist, community, or any other form of public transportation.) Morning Medicines: Except for blood thinners, take all of your other morning medications with a sip of water . Make sure to take your heart and blood pressure medicines. If your blood pressure's lower number is above 100, the case will be rescheduled. Blood thinners: Make sure to stop your blood thinners as instructed.  If you take a blood thinner, but were not instructed to stop it, call our office [phone] and ask to talk to a nurse. Not stopping a blood thinner prior to certain procedures could lead to serious complications. Diabetics on insulin : Notify the staff so that you can be scheduled 1st case in the morning. If your diabetes requires high dose insulin , take only  of your normal insulin  dose the morning of the procedure and notify the staff that you have done so. Preventing infections: Shower with an antibacterial soap the morning of your procedure.  Build-up your immune system: Take 1000 mg of Vitamin C with every meal (3 times a day) the day prior to your procedure. Antibiotics: Inform the nursing staff if you are taking any antibiotics or if you have any conditions that may require antibiotics prior to procedures. (Example: recent joint implants)   Pregnancy: If you are pregnant make sure to notify the nursing staff. Not doing so may result in injury to the fetus, including death.  Sickness: If you have a cold, fever, or any active infections, call and cancel or reschedule your procedure. Receiving steroids while having an infection may result in complications. Arrival: You must be in the facility  at least 30 minutes prior to your scheduled procedure. Tardiness: Your scheduled time is also the cutoff time. If you do not arrive at least 15 minutes prior to your procedure, you will be rescheduled.  Children: Do not bring any children with you. Make arrangements to keep them home. Dress appropriately: There is always a possibility that your clothing may get soiled. Avoid long dresses. Valuables: Do not bring any jewelry or valuables.  Reasons to call and reschedule or cancel your procedure: (Following these recommendations will minimize the risk of a serious complication.) Surgeries: Avoid having procedures within 2 weeks of any surgery. (Avoid for 2 weeks before or after any surgery). Flu Shots: Avoid having procedures within 2 weeks of a flu shots or . (Avoid for 2 weeks before or after immunizations). Barium: Avoid having a procedure within 7-10 days after having had a radiological study involving the use of radiological contrast. (Myelograms, Barium swallow or enema study). Heart attacks: Avoid any elective procedures or surgeries for the initial 6 months after a Myocardial Infarction (Heart Attack). Blood thinners: It is imperative that you stop these medications before procedures. Let us  know if you if you take any blood thinner.  Infection: Avoid procedures during or within two weeks of an infection (including chest colds or gastrointestinal problems). Symptoms associated with infections include: Localized redness, fever, chills, night sweats or profuse sweating, burning sensation when voiding, cough, congestion, stuffiness, runny nose, sore throat, diarrhea, nausea, vomiting, cold or Flu symptoms, recent or current infections. It is specially important if the infection is over the area that we intend to treat. Heart and lung problems: Symptoms that may suggest an active cardiopulmonary problem include: cough, chest pain, breathing difficulties or shortness of breath, dizziness, ankle  swelling, uncontrolled high or unusually low blood pressure, and/or palpitations. If you  are experiencing any of these symptoms, cancel your procedure and contact your primary care physician for an evaluation.  Remember:  Regular Business hours are:  Monday to Thursday 8:00 AM to 4:00 PM  Provider's Schedule: Eric Como, MD:  Procedure days: Tuesday and Thursday 7:30 AM to 4:00 PM  Wallie Sherry, MD:  Procedure days: Monday and Wednesday 7:30 AM to 4:00 PM Last  Updated: 03/02/2023 ______________________________________________________________________     ______________________________________________________________________    Blood Thinners  IMPORTANT NOTICE:  If you take any of these, make sure to notify the nursing staff.  Failure to do so may result in serious injury.  Recommended time intervals to stop and restart blood-thinners, before & after invasive procedures  Generic Name Brand Name Pre-procedure: Stop medication for this amount of time before your procedure: Post-procedure: Wait this amount of time after the procedure before restarting your medication:  Abciximab Reopro 15 days 2 hrs  Alteplase Activase 10 days 10 days  Anagrelide Agrylin    Apixaban  Eliquis  3 days 6 hrs  Cilostazol Pletal 3 days 5 hrs  Clopidogrel Plavix 7-10 days 2 hrs  Dabigatran Pradaxa 5 days 6 hrs  Dalteparin Fragmin 24 hours 4 hrs  Dipyridamole Aggrenox 11days 2 hrs  Edoxaban Lixiana; Savaysa 3 days 2 hrs  Enoxaparin   Lovenox  24 hours 4 hrs  Eptifibatide Integrillin 8 hours 2 hrs  Fondaparinux  Arixtra 72 hours 12 hrs  Hydroxychloroquine Plaquenil 11 days   Prasugrel Effient 7-10 days 6 hrs  Reteplase Retavase 10 days 10 days  Rivaroxaban Xarelto 3 days 6 hrs  Ticagrelor Brilinta 5-7 days 6 hrs  Ticlopidine Ticlid 10-14 days 2 hrs  Tinzaparin Innohep 24 hours 4 hrs  Tirofiban Aggrastat 8 hours 2 hrs  Warfarin Coumadin 5 days 2 hrs   Other medications with blood-thinning  effects  NOTE: Consider stopping these if you have prolonged bleeding despite not taking any of the above blood thinners. Otherwise ask your provider and this will be decided on a case-by-case basis.  Product indications Generic (Brand) names Note  Cholesterol Lipitor Stop 4 days before procedure  Blood thinner (injectable) Heparin  (LMW or LMWH Heparin ) Stop 24 hours before procedure  Cancer Ibrutinib (Imbruvica) Stop 7 days before procedure  Malaria/Rheumatoid Hydroxychloroquine (Plaquenil) Stop 11 days before procedure  Thrombolytics  10 days before or after procedures   Over-the-counter (OTC) Products with blood-thinning effects  NOTE: Consider stopping these if you have prolonged bleeding despite not taking any of the above blood thinners. Otherwise ask your provider and this will be decided on a case-by-case basis.  Product Common names Stop Time  Aspirin > 325 mg Goody Powders, Excedrin, etc. 11 days  Aspirin <= 81 mg  7 days  Fish oil  4 days  Garlic supplements  7 days  Ginkgo biloba  36 hours  Ginseng  24 hours  NSAIDs Ibuprofen , Naprosyn, etc. 3 days  Vitamin E  4 days   ______________________________________________________________________     ______________________________________________________________________    Procedure instructions  Stop blood-thinners  Do not eat or drink fluids (other than water ) for 6 hours before your procedure  No water  for 2 hours before your procedure  Take your blood pressure medicine with a sip of water   Arrive 30 minutes before your appointment  If sedation is planned, bring suitable driver. [place], [place], & public transportation are NOT APPROVED)  Carefully read the Preparing for your procedure detailed instructions  If you have questions call us  at [phone]  Procedure appointments are  for procedures only.   NO medication refills or new problem evaluations will be done on procedure days.   Only the scheduled,  pre-approved procedure and side will be done.   ______________________________________________________________________

## 2024-01-13 NOTE — Progress Notes (Signed)
 Safety precautions to be maintained throughout the outpatient stay will include: orient to surroundings, keep bed in low position, maintain call bell within reach at all times, provide assistance with transfer out of bed and ambulation.

## 2024-01-13 NOTE — Progress Notes (Signed)
 PROVIDER NOTE: Interpretation of information contained herein should be left to medically-trained personnel. Specific patient instructions are provided elsewhere under Patient Instructions section of medical record. This document was created in part using AI and STT-dictation technology, any transcriptional errors that may result from this process are unintentional.  Patient: Summer Murphy Ao  Service: E/M   PCP: Liana Fish, NP  DOB: 1955-11-20  DOS: 01/13/2024  Provider: Emmy MARLA Blanch, NP  MRN: [id number]  Delivery: Face-to-face  Specialty: Interventional Pain Management  Type: Established Patient  Setting: Ambulatory outpatient facility  Specialty designation: 09  Referring Prov.: Liana Fish, NP  Location: Outpatient office facility       History of present illness (HPI) [name], a 68 y.o. year old female, is here today because of her Primary osteoarthritis of left knee [M17.12]. Ms. Grout primary complain today is Knee Pain   Pain Assessment: Severity of Chronic pain is reported as a 9 /10. Location: Knee Left/From Left knee up the left thigh, then down to keft ankle. Onset: More than a month ago. Quality: Stabbing, Radiating. Timing: Intermittent. Modifying factor(s): Meds, Ice. Vitals:  height is 4' 11.5 (1.511 m) and weight is 107 lb (48.5 kg). Her temporal temperature is 97.9 F (36.6 C). Her blood pressure is 100/52 (abnormal) and her pulse is 94. Her respiration is 18 and oxygen  saturation is 100%.  BMI: Estimated body mass index is 21.25 kg/m as calculated from the following:   Height as of this encounter: 4' 11.5 (1.511 m).   Weight as of this encounter: 107 lb (48.5 kg).  Last encounter: 01/04/2024 Last procedure: Visit date not found.  Reason for encounter: evaluation for possible interventional PM therapy/treatment and medication management. No change in medical history since last visit.  Patient's pain is at baseline.  Patient continues  multimodal pain regimen as prescribed.  States that it provides pain relief and improvement in functional status.   Discussed the use of AI scribe software for clinical note transcription with the patient, who gave verbal consent to proceed.  History of Present Illness   Summer Murphy is a 68 year old female who presents with chronic knee pain. She experiences severe pain in her left knee, which has been persistent and causes significant distress, to the point of crying due to the intensity. The left knee is the primary source of discomfort, while the right knee occasionally experiences mild discomfort. The pain has been ongoing, and she has previously undergone an x-ray at [facility]. She was scheduled for a scan, but insurance issues prevented it from being completed.  Her current pain management regimen includes hydrocodone  5 mg twice daily as needed. She also takes additional Tylenol  during the day, ensuring not to exceed the recommended dosage.   Her medication regimen also includes Eliquis , 5 mg, which she pauses prior to certain procedures, as she did previously for a neck procedure. She recalls having a successful nerve block for her neck pain, which resolved her symptoms, and hopes for similar relief for her knee pain.     Pharmacotherapy Assessment   Norco 5-325 mg every 12 hours as needed for pain.  Monitoring: [place] PMP: PDMP reviewed during this encounter.       Pharmacotherapy: No side-effects or adverse reactions reported. Compliance: No problems identified. Effectiveness: Clinically acceptable.  Summer Murphy, [place]  01/13/2024  1:13 PM  Sign when Signing Visit Safety precautions to be maintained throughout the outpatient stay will include: orient to surroundings, keep  bed in low position, maintain call bell within reach at all times, provide assistance with transfer out of bed and ambulation.     UDS:  Summary  Date Value Ref Range Status  01/04/2024 FINAL  Final     Comment:    ==================================================================== Compliance Drug Analysis, Ur ==================================================================== Test                             Result       Flag       Units  Drug Present and Declared for Prescription Verification   Hydrocodone                     4070         EXPECTED   ng/mg creat   Dihydrocodeine                 128          EXPECTED   ng/mg creat   Norhydrocodone                 259          EXPECTED   ng/mg creat    A moderate to large amount of hydrocodone  is present; metabolite is    present at very low concentration. This is an atypical result.    Although patients with unusual metabolic profiles exist, they are    rare. Review of previous drug screen results or collection of a    urine sample several hours after a WITNESSED dose of the drug may    help to clarify the subject's ability to produce metabolite.     Sources of hydrocodone  include scheduled prescription medications.    Dihydrocodeine and norhydrocodone are expected metabolites of    hydrocodone . Dihydrocodeine is also available as a scheduled    prescription medication.    Gabapentin                      PRESENT      EXPECTED   Methocarbamol                   PRESENT      EXPECTED   Trazodone                       PRESENT      EXPECTED   1,3 chlorophenyl piperazine    PRESENT      EXPECTED    1,3-chlorophenyl piperazine is an expected metabolite of trazodone .    Duloxetine                      PRESENT      EXPECTED   Acetaminophen                   PRESENT      EXPECTED  Drug Present not Declared for Prescription Verification   Ibuprofen                       PRESENT      UNEXPECTED   Diphenhydramine                 PRESENT      UNEXPECTED   Diltiazem                       PRESENT      UNEXPECTED  Drug Absent but Declared for  Prescription Verification   Clonazepam                      Not Detected UNEXPECTED ng/mg creat   Diclofenac                       Not Detected UNEXPECTED    Diclofenac , as indicated in the declared medication list, is not    always detected even when used as directed.    Lidocaine                       Not Detected UNEXPECTED    Lidocaine , as indicated in the declared medication list, is not    always detected even when used as directed.  ==================================================================== Test                      Result    Flag   Units      Ref Range   Creatinine              159              mg/dL      >=79 ==================================================================== Declared Medications:  The flagging and interpretation on this report are based on the  following declared medications.  Unexpected results may arise from  inaccuracies in the declared medications.   **Note: The testing scope of this panel includes these medications:   Clonazepam  (Klonopin )  Duloxetine  (Cymbalta )  Gabapentin   Hydrocodone   Methocarbamol  (Robaxin )  Trazodone  (Desyrel )   **Note: The testing scope of this panel does not include small to  moderate amounts of these reported medications:   Acetaminophen   Diclofenac  (Voltaren )  Topical Lidocaine  (Lidoderm )   **Note: The testing scope of this panel does not include the  following reported medications:   Albuterol  (Duoneb)  Albuterol  (Combivent )  Alendronate  (Fosamax )  Apixaban  (Eliquis )  Buspirone  (Buspar )  Cefadroxil  (Duricef)  Dupilumab  (Dupixent )  Fluconazole  (Diflucan )  Fluticasone  (Advair)  Ipratropium (Duoneb)  Ipratropium (Combivent )  Montelukast  (Singulair )  Ondansetron  (Zofran )  Oxybutynin  (Ditropan )  Pantoprazole  (Protonix )  Polyethylene Glycol (MiraLAX )  Potassium (Klor-Con )  Ropinirole  (Requip )  Salmeterol (Advair)  Sumatriptan  (Imitrex ) ==================================================================== For clinical consultation, please call [phone]. ====================================================================     No results found for: CBDTHCR No results found for: D8THCCBX No results found for: D9THCCBX  ROS  Constitutional: Denies any fever or chills Gastrointestinal: No reported hemesis, hematochezia, vomiting, or acute GI distress Musculoskeletal: Bilateral knee pain (L>R) Neurological: No reported episodes of acute onset apraxia, aphasia, dysarthria, agnosia, amnesia, paralysis, loss of coordination, or loss of consciousness  Medication Review  Accu-Chek Guide Me, Accu-Chek Softclix Lancets, DULoxetine , Dupilumab , HYDROcodone -acetaminophen , Ipratropium-Albuterol , SUMAtriptan , acetaminophen , alendronate , apixaban , busPIRone , cefadroxil , clonazePAM , diclofenac  Sodium, fluconazole , fluticasone -salmeterol, gabapentin , glucose blood, ipratropium-albuterol , lidocaine , methocarbamol , montelukast , ondansetron , oxybutynin , pantoprazole , polyethylene glycol, potassium chloride , rOPINIRole , and traZODone   History Review  Allergy: Ms. Mankin is allergic to librium [chlordiazepoxide], reglan [metoclopramide], vanilla, aspirin, nsaids, azithromycin , buprenorphine hcl, flexeril  [cyclobenzaprine ], morphine, sulfa antibiotics, tolmetin, amlodipine  besylate, and iron . Drug: Ms. Pittinger  reports no history of drug use. Alcohol:  reports no history of alcohol use. Tobacco:  reports that she has never smoked. She has been exposed to tobacco smoke. She has never used smokeless tobacco. Social: Ms. Loureiro  reports that she has never smoked. She has been exposed to tobacco smoke. She has never used smokeless tobacco. She reports that she does not drink alcohol and does not use drugs. Medical:  has  a past medical history of Acid reflux, Anemia, Anxiety, Arthritis, Aspiration pneumonia (HCC) (06/02/2022), Asthma, CAP (community acquired pneumonia) (04/24/2022), Cervical disc disorder with radiculopathy of cervical region, Chronic  pain syndrome, Chronic, continuous use of opioids, COPD (chronic obstructive pulmonary disease) (HCC), DOE (dyspnea on exertion), Hematuria, Hiatal hernia, History of kidney stones, Hypertension, Hypertensive urgency (05/12/2022), Hypoglycemia, Insomnia, LBBB (left bundle branch block), Migraine, MSSA bacteremia (04/19/2023), Murmur, Osteopenia, Palpitations, Pre-diabetes, Restless leg, Sepsis (HCC) (05/12/2022), Stenosis of cervical spine with myelopathy (HCC), and T2DM (type 2 diabetes mellitus) (HCC). Surgical: Ms. Lunt  has a past surgical history that includes Ureteroscopy; Laparoscopic hysterectomy; Lithotripsy; HH repair; Cardiac electrophysiology study and ablation; Appendectomy (1990); Cholecystectomy (1990); Joint replacement (Bilateral, [id number]); Extracorporeal shock wave lithotripsy (Left, 09/12/2015); Tonsillectomy; Corneal transplant; Abdominal hysterectomy; Breast surgery; Fracture surgery; Amputation toe (Left, 07/31/2016); Excision bone cyst (Left, 07/31/2016); Colonoscopy with propofol  (N/A, 01/04/2017); Esophagogastroduodenoscopy (egd) with propofol  (N/A, 01/04/2017); Back surgery; Total knee revision (Right, 11/14/2019); periprosthetic supracondylar fracture of left femur (02/16/2020); Hardware Removal (Left, 11/14/2020); Esophagogastroduodenoscopy (N/A, 04/09/2021); Gum surgery (Left); Posterior cervical fusion/foraminotomy (N/A, 03/02/2023); Cervical wound debridement (N/A, 04/17/2023); and laparotomy (N/A, 11/27/2023). Family: family history includes Breast cancer in her maternal aunt; Diabetes in her brother; Hypertension in her brother and mother; Kidney Stones in her father; Prostate cancer in her father; Stroke in her mother.  Laboratory Chemistry Profile   Renal Lab Results  Component Value Date   BUN <5 (L) 11/30/2023   CREATININE 0.38 (L) 11/30/2023   BCR 17 01/15/2022   GFRAA >60 11/16/2019   GFRNONAA >60 11/30/2023    Hepatic Lab Results  Component Value Date   AST  15 11/27/2023   ALT 16 11/27/2023   ALBUMIN 2.5 (L) 11/27/2023   ALKPHOS 63 11/27/2023   LIPASE 19 11/26/2023    Electrolytes Lab Results  Component Value Date   NA 140 11/30/2023   K 3.5 11/30/2023   CL 103 11/30/2023   CALCIUM 7.9 (L) 11/30/2023   MG 1.9 11/30/2023   PHOS 2.9 04/18/2023    Bone Lab Results  Component Value Date   VD25OH 34.34 10/12/2023    Inflammation (CRP: Acute Phase) (ESR: Chronic Phase) Lab Results  Component Value Date   CRP 5.4 (H) 11/17/2023   ESRSEDRATE 63 (H) 11/17/2023   LATICACIDVEN 0.7 11/27/2023         Note: Above Lab results reviewed.  Recent Imaging Review  Impression  1.  Prior total left knee arthroplasty without evidence of complication. 2.  Possible small left knee effusion, nonspecific.  BRENDAN CHRISTOPHER CLINE, MD   Narrative  EXAM: X-ray knee left 4 plus views  INDICATION: left medial knee pain and swelling Acute pain of left knee [F74.437 (ICD-10-CM)]  COMPARISON: None 1123  FINDINGS: Osteopenia. Prior total left knee arthroplasty with intact hardware. No periprosthetic fracture. No acute fracture or dislocation. Regional soft tissues unremarkable. No aggressive osseus lesion. Possible small effusion, nonspecific Note: Reviewed        Physical Exam  Vitals: BP (!) 100/52 (BP Location: Right Arm, Patient Position: Sitting, Cuff Size: Normal)   Pulse 94   Temp 97.9 F (36.6 C) (Temporal)   Resp 18   Ht 4' 11.5 (1.511 m)   Wt 107 lb (48.5 kg)   SpO2 100%   BMI 21.25 kg/m  BMI: Estimated body mass index is 21.25 kg/m as calculated from the following:   Height as of this encounter: 4' 11.5 (1.511 m).   Weight as of this encounter: 107 lb (48.5  kg). Ideal: Patient must be at least 60 in tall to calculate ideal body weight General appearance: Well nourished, well developed, and well hydrated. In no apparent acute distress Mental status: Alert, oriented x 3 (person, place, & time)       Respiratory: No  evidence of acute respiratory distress Eyes: PERLA  Musculoskeletal: + Bilateral knee pain worse with walking, weightbearing, standing Assessment   Diagnosis Status  1. Primary osteoarthritis of left knee   2. Chronic pain of left knee   3. Chronic pain syndrome   4. Cervical radicular pain (left severe)   5. Foraminal stenosis of cervical region (severe b/l C5/6; C6/7)   6. Bilateral occipital neuralgia   7. Spinal stenosis in cervical region   8. Hx of long-term (current) use of anticoagulants    Persistent Persistent Persistent   Updated Problems: Problem  Hx of Long-Term (Current) Use of Anticoagulants    Plan of Care  Problem-specific:  Assessment and Plan    Chronic left knee pain  Chronic left knee pain with significant severity, causing distress and functional impairment. Proposed genicular nerve block as initial intervention, with potential for radiofrequency ablation (RFA). Prefers sedation during procedure. - Schedule genicular nerve block for the left knee in two weeks, pending insurance approval. - Administer sedation during the procedure, with options for PO Valium  or IV Versed . - Prescribe hydrocodone  5 mg with Tylenol , to be taken twice daily as needed for pain. - Allow additional Tylenol  intake during the day, ensuring not to exceed the maximum daily dose.   Anticoagulation therapy management (Eliquis ) peri-procedural planning On Eliquis  5 mg for anticoagulation. Requires stopping Eliquis  three days prior to genicular nerve block to minimize bleeding risk. - Instruct to stop Eliquis  three days prior to the genicular nerve block procedure.   Chronic pain syndrome: Patient's pain is well-controlled with hydrocodone , with pain on current medication regimen.  Prescribing drug monitoring (PMP) reviewed, findings consistent with the use of prescribed medication and no evidence of narcotic misuse or abuse.  Urine drugs screening (UDS) up to date.  Schedule follow-up  in 1 month for medication management.  Plan: (Clinic): (L) Genicular NB # 1 with (IV versed ) with Dr. Marcelino , (Blood thinner protocol)       Ms. KASIA TREGO has a current medication list which includes the following long-term medication(s): apixaban , clonazepam , combivent  respimat, duloxetine , dupixent , fluticasone -salmeterol, gabapentin , ipratropium-albuterol , montelukast , pantoprazole , potassium chloride , ropinirole , sumatriptan , and trazodone .  Pharmacotherapy (Medications Ordered): Meds ordered this encounter  Medications   HYDROcodone -acetaminophen  (NORCO/VICODIN) 5-325 MG tablet    Sig: Take 1 tablet by mouth 2 (two) times daily as needed for severe pain (pain score 7-10).    Dispense:  60 tablet    Refill:  0    Refill please   Orders:  Orders Placed This Encounter  Procedures   GENICULAR NERVE BLOCK    Indication(s):  Sub-acute knee pain    Standing Status:   Future    Expiration Date:   04/14/2024    Scheduling Instructions:     Side: Left-sided     Sedation: With Sedation. (IV versed )     Timeframe: As soon as schedule allows    Where will this procedure be performed?:   [facility]   Blood Thinner Instructions to Nursing    Always make sure patient has clearance from prescribing physician to stop blood thinners for interventional therapies. If the patient requires a Lovenox -bridge therapy, make sure arrangements are made to institute it  with the assistance of the PCP.    Scheduling Instructions:     Have Ms. Burningham stop the Eliquis  (Apixaban ) x 3 days prior to procedure or surgery.        Return in about 2 weeks (around 01/27/2024) for (Clinic): (L) Genicular NB # 1 with (IV versed ) with Dr. Marcelino , (Blood thinner protocol).    Recent Visits Date Type Provider Dept  01/04/24 Office Visit Marcelino Nurse, MD [facility]  11/03/23 Procedure visit Marcelino Nurse, MD [facility]  10/26/23 Office Visit Marcelino Nurse, MD [facility]  Showing recent visits within past 90 days and meeting all other requirements Today's Visits Date Type Provider Dept  01/13/24 Office Visit Raksha Wolfgang K, NP [facility]  Showing today's visits and meeting all other requirements Future Appointments Date Type Provider Dept  01/24/24 Appointment Leroy Pettway K, NP [facility]  Showing future appointments within next 90 days and meeting all other requirements  I discussed the assessment and treatment plan with the patient. The patient was provided an opportunity to ask questions and all were answered. The patient agreed with the plan and demonstrated an understanding of the instructions.  Patient advised to call back or seek an in-person evaluation if the symptoms or condition worsens.  I personally spent a total of 30 minutes in the care of the patient today including preparing to see the patient, getting/reviewing separately obtained history, performing a medically appropriate exam/evaluation, counseling and educating, placing orders, referring and communicating with other health care professionals, documenting clinical information in the EHR, independently interpreting results, communicating results, and coordinating care.  Note by: Anniemae Haberkorn K Mieshia Pepitone, NP (TTS and AI technology used. I apologize for any typographical errors that were not detected and corrected.) Date: 01/13/2024; Time: 3:03 PM

## 2024-01-14 ENCOUNTER — Other Ambulatory Visit: Payer: Self-pay | Admitting: Nurse Practitioner

## 2024-01-17 ENCOUNTER — Other Ambulatory Visit (HOSPITAL_COMMUNITY): Payer: Self-pay

## 2024-01-17 ENCOUNTER — Telehealth: Payer: Self-pay

## 2024-01-17 ENCOUNTER — Other Ambulatory Visit: Payer: Self-pay | Admitting: Nurse Practitioner

## 2024-01-17 DIAGNOSIS — K449 Diaphragmatic hernia without obstruction or gangrene: Secondary | ICD-10-CM

## 2024-01-17 DIAGNOSIS — J4540 Moderate persistent asthma, uncomplicated: Secondary | ICD-10-CM

## 2024-01-17 MED ORDER — ALBUTEROL SULFATE HFA 108 (90 BASE) MCG/ACT IN AERS: 2.0000 | INHALATION_SPRAY | Freq: Four times a day (QID) | RESPIRATORY_TRACT | 2 refills | Status: AC | PRN

## 2024-01-17 NOTE — Telephone Encounter (Signed)
 PA renewal initiated automatically by CoverMyMeds.  Submitted an Urgent Prior Authorization request to [employer] for DUPIXENT  via CoverMyMeds. Will update once we receive a response.  Key: [id number]

## 2024-01-17 NOTE — Telephone Encounter (Signed)
 Patient advised. She reports she was using Combivent  PRN, has been using this for a long time. Will use Albuterol , as needed. Will call back, if albuterol  is not helping with symptoms.

## 2024-01-17 NOTE — Telephone Encounter (Signed)
 Copied from CRM [id number]. Topic: Clinical - Prescription Issue >> Jan 17, 2024  1:39 PM Corean SAUNDERS wrote: Reason for CRM: Patient states Dr. Isadora took her off of COMBIVENT  but did not order her an alternative rescue inhaler.  Please call patient back to advise.

## 2024-01-17 NOTE — Telephone Encounter (Signed)
 Received notification from [employer] regarding a prior authorization for DUPIXENT . Authorization has been APPROVED from 01/17/24 to 03/22/25. Approval letter sent to scan center.  Unable to run test claim because it's refill too soon until 11/17.  Patient can continue to fill through [facility] [phone].  Authorization # [id number] Phone # [phone]

## 2024-01-19 ENCOUNTER — Encounter

## 2024-01-19 ENCOUNTER — Ambulatory Visit: Admitting: Student in an Organized Health Care Education/Training Program

## 2024-01-24 ENCOUNTER — Ambulatory Visit: Attending: Nurse Practitioner | Admitting: Nurse Practitioner

## 2024-01-24 ENCOUNTER — Encounter: Payer: Self-pay | Admitting: Nurse Practitioner

## 2024-01-24 ENCOUNTER — Encounter: Payer: Self-pay | Admitting: Radiology

## 2024-01-24 VITALS — BP 120/68 | HR 118 | Temp 99.0°F | Resp 16 | Ht 59.5 in | Wt 107.0 lb

## 2024-01-24 DIAGNOSIS — M4802 Spinal stenosis, cervical region: Secondary | ICD-10-CM | POA: Diagnosis present

## 2024-01-24 DIAGNOSIS — M25562 Pain in left knee: Secondary | ICD-10-CM | POA: Insufficient documentation

## 2024-01-24 DIAGNOSIS — Z7901 Long term (current) use of anticoagulants: Secondary | ICD-10-CM | POA: Insufficient documentation

## 2024-01-24 DIAGNOSIS — G8929 Other chronic pain: Secondary | ICD-10-CM | POA: Insufficient documentation

## 2024-01-24 DIAGNOSIS — M1712 Unilateral primary osteoarthritis, left knee: Secondary | ICD-10-CM | POA: Diagnosis not present

## 2024-01-24 DIAGNOSIS — G894 Chronic pain syndrome: Secondary | ICD-10-CM | POA: Diagnosis present

## 2024-01-24 DIAGNOSIS — Z79899 Other long term (current) drug therapy: Secondary | ICD-10-CM | POA: Diagnosis present

## 2024-01-24 DIAGNOSIS — M5412 Radiculopathy, cervical region: Secondary | ICD-10-CM | POA: Diagnosis present

## 2024-01-24 MED ORDER — HYDROCODONE-ACETAMINOPHEN 5-325 MG PO TABS: 1.0000 | ORAL_TABLET | Freq: Two times a day (BID) | ORAL | 0 refills | Status: DC | PRN

## 2024-01-24 NOTE — Progress Notes (Signed)
 Nursing Pain Medication Assessment:  Safety precautions to be maintained throughout the outpatient stay will include: orient to surroundings, keep bed in low position, maintain call bell within reach at all times, provide assistance with transfer out of bed and ambulation.  Medication Inspection Compliance: Pill count conducted under aseptic conditions, in front of the patient. Neither the pills nor the bottle was removed from the patient's sight at any time. Once count was completed pills were immediately returned to the patient in their original bottle.  Medication: Hydrocodone /APAP Pill/Patch Count: 39 of 60 pills/patches remain Pill/Patch Appearance: Markings consistent with prescribed medication Bottle Appearance: Standard pharmacy container. Clearly labeled. Filled Date: 65 / 23 / 2025 Last Medication intake:  Yesterday

## 2024-01-24 NOTE — Progress Notes (Signed)
 PROVIDER NOTE: Interpretation of information contained herein should be left to medically-trained personnel. Specific patient instructions are provided elsewhere under Patient Instructions section of medical record. This document was created in part using AI and STT-dictation technology, any transcriptional errors that may result from this process are unintentional.  Patient: Summer Murphy  Service: E/M   PCP: Liana Fish, NP  DOB: 06/04/1955  DOS: 01/24/2024  Provider: Emmy MARLA Blanch, NP  MRN: [id number]  Delivery: Face-to-face  Specialty: Interventional Pain Management  Type: Established Patient  Setting: Ambulatory outpatient facility  Specialty designation: 09  Referring Prov.: Liana Fish, NP  Location: Outpatient office facility       History of present illness (HPI) [name], a 68 y.o. year old female, is here today because of her Primary osteoarthritis of left knee [M17.12]. [name] primary complain today is Knee Pain (left) and Neck Pain  Pertinent problems: [name] has Severe recurrent major depression without psychotic features [facility]); COPD (chronic obstructive pulmonary disease) (HCC); Barrett esophagus; Chronic asthmatic bronchitis; Foraminal stenosis of cervical region; Cervical disc disorder with radiculopathy of cervical region; Cervicogenic headache; S/P revision of total knee, right; and Cervical radicular pain (left severe) on their pertinent problem list.   Pain Assessment: Severity of Chronic pain is reported as a 9 /10. Location: Knee Left/to left thigh and to left ankle. Onset: More than a month ago. Quality: Stabbing. Timing: Constant. Modifying factor(s): meds, ice, heat. Vitals:  height is 4' 11.5 (1.511 m) and weight is 107 lb (48.5 kg). Her temperature is 99 F (37.2 C). Her blood pressure is 120/68 and her pulse is 118 (abnormal). Her respiration is 16 and oxygen  saturation is 98%.  BMI: Estimated body mass index is 21.25 kg/m as  calculated from the following:   Height as of this encounter: 4' 11.5 (1.511 m).   Weight as of this encounter: 107 lb (48.5 kg).  Last encounter: 01/13/2024. Last procedure: Visit date not found.  Reason for encounter: medication management. No change in medical history since last visit.  Patient's pain is at baseline.  Patient continues multimodal pain regimen as prescribed.  States that it provides pain relief and improvement in functional status.   Discussed the use of AI scribe software for clinical note transcription with the patient, who gave verbal consent to proceed.  History of Present Illness   Summer Murphy is a 68 year old female who presents for a medication management follow-up.   She experiences significant pain in her left knee, describing it as severe and unlike anything she has felt before. She is currently taking hydrocodone  5-325 mg, which she took this morning, and is allowed to use it twice as needed. She is also on Eliquis , which she needs to stop three days before her upcoming Genicular Nerve block.   She mentions experiencing neck pain again, localized to the middle of her neck. She recalls having a cervical epidural and occipital nerve block performed approximately three months ago, which initially provided relief. However, the pain has returned, and she is concerned about it worsening.  She reports significant pain relief from the previous cervical epidural injection and occipital nerve block. We discussed repeating the procedure in January 2026.  I explained that it may be too soon for another cervical epidural injection and recommended waiting for at least 3 months before repeating the procedure.      Pharmacotherapy Assessment   Norco 5-325 mg every 12 hours as needed for pain.  Monitoring: [place] PMP: PDMP reviewed during this encounter.       Pharmacotherapy: No side-effects or adverse reactions reported. Compliance: No problems identified. Effectiveness:  Clinically acceptable.  Dayna Pulling, RN  01/24/2024 10:23 AM  Sign when Signing Visit Nursing Pain Medication Assessment:  Safety precautions to be maintained throughout the outpatient stay will include: orient to surroundings, keep bed in low position, maintain call bell within reach at all times, provide assistance with transfer out of bed and ambulation.  Medication Inspection Compliance: Pill count conducted under aseptic conditions, in front of the patient. Neither the pills nor the bottle was removed from the patient's sight at any time. Once count was completed pills were immediately returned to the patient in their original bottle.  Medication: Hydrocodone /APAP Pill/Patch Count: 39 of 60 pills/patches remain Pill/Patch Appearance: Markings consistent with prescribed medication Bottle Appearance: Standard pharmacy container. Clearly labeled. Filled Date: 22 / 23 / 2025 Last Medication intake:  Yesterday    UDS:  Summary  Date Value Ref Range Status  01/04/2024 FINAL  Final    Comment:    ==================================================================== Compliance Drug Analysis, Ur ==================================================================== Test                             Result       Flag       Units  Drug Present and Declared for Prescription Verification   Hydrocodone                     4070         EXPECTED   ng/mg creat   Dihydrocodeine                 128          EXPECTED   ng/mg creat   Norhydrocodone                 259          EXPECTED   ng/mg creat    A moderate to large amount of hydrocodone  is present; metabolite is    present at very low concentration. This is an atypical result.    Although patients with unusual metabolic profiles exist, they are    rare. Review of previous drug screen results or collection of a    urine sample several hours after a WITNESSED dose of the drug may    help to clarify the subject's ability to produce metabolite.      Sources of hydrocodone  include scheduled prescription medications.    Dihydrocodeine and norhydrocodone are expected metabolites of    hydrocodone . Dihydrocodeine is also available as a scheduled    prescription medication.    Gabapentin                      PRESENT      EXPECTED   Methocarbamol                   PRESENT      EXPECTED   Trazodone                       PRESENT      EXPECTED   1,3 chlorophenyl piperazine    PRESENT      EXPECTED    1,3-chlorophenyl piperazine is an expected metabolite of trazodone .    Duloxetine   PRESENT      EXPECTED   Acetaminophen                   PRESENT      EXPECTED  Drug Present not Declared for Prescription Verification   Ibuprofen                       PRESENT      UNEXPECTED   Diphenhydramine                 PRESENT      UNEXPECTED   Diltiazem                       PRESENT      UNEXPECTED  Drug Absent but Declared for Prescription Verification   Clonazepam                      Not Detected UNEXPECTED ng/mg creat   Diclofenac                      Not Detected UNEXPECTED    Diclofenac , as indicated in the declared medication list, is not    always detected even when used as directed.    Lidocaine                       Not Detected UNEXPECTED    Lidocaine , as indicated in the declared medication list, is not    always detected even when used as directed.  ==================================================================== Test                      Result    Flag   Units      Ref Range   Creatinine              159              mg/dL      >=79 ==================================================================== Declared Medications:  The flagging and interpretation on this report are based on the  following declared medications.  Unexpected results may arise from  inaccuracies in the declared medications.   **Note: The testing scope of this panel includes these medications:   Clonazepam  (Klonopin )  Duloxetine  (Cymbalta )   Gabapentin   Hydrocodone   Methocarbamol  (Robaxin )  Trazodone  (Desyrel )   **Note: The testing scope of this panel does not include small to  moderate amounts of these reported medications:   Acetaminophen   Diclofenac  (Voltaren )  Topical Lidocaine  (Lidoderm )   **Note: The testing scope of this panel does not include the  following reported medications:   Albuterol  (Duoneb)  Albuterol  (Combivent )  Alendronate  (Fosamax )  Apixaban  (Eliquis )  Buspirone  (Buspar )  Cefadroxil  (Duricef)  Dupilumab  (Dupixent )  Fluconazole  (Diflucan )  Fluticasone  (Advair)  Ipratropium (Duoneb)  Ipratropium (Combivent )  Montelukast  (Singulair )  Ondansetron  (Zofran )  Oxybutynin  (Ditropan )  Pantoprazole  (Protonix )  Polyethylene Glycol (MiraLAX )  Potassium (Klor-Con )  Ropinirole  (Requip )  Salmeterol (Advair)  Sumatriptan  (Imitrex ) ==================================================================== For clinical consultation, please call [phone]. ====================================================================     No results found for: CBDTHCR No results found for: D8THCCBX No results found for: D9THCCBX  ROS  Constitutional: Denies any fever or chills Gastrointestinal: No reported hemesis, hematochezia, vomiting, or acute GI distress Musculoskeletal: Left knee pain, neck pain Neurological: No reported episodes of acute onset apraxia, aphasia, dysarthria, agnosia, amnesia, paralysis, loss of coordination, or loss of consciousness  Medication Review  Accu-Chek Guide Me, Accu-Chek Softclix Lancets, DULoxetine , Dupilumab , HYDROcodone -acetaminophen , Ipratropium-Albuterol , SUMAtriptan ,  acetaminophen , albuterol , alendronate , apixaban , busPIRone , cefadroxil , clonazePAM , diclofenac  Sodium, fluticasone -salmeterol, gabapentin , glucose blood, ipratropium-albuterol , lidocaine , methocarbamol , montelukast , ondansetron , oxybutynin , pantoprazole , polyethylene glycol, potassium chloride , rOPINIRole ,  and traZODone   History Review  Allergy: [name] is allergic to librium [chlordiazepoxide], reglan [metoclopramide], vanilla, aspirin, nsaids, azithromycin , buprenorphine hcl, flexeril  [cyclobenzaprine ], morphine, sulfa antibiotics, tolmetin, amlodipine  besylate, and iron . Drug: [name]  reports no history of drug use. Alcohol:  reports no history of alcohol use. Tobacco:  reports that she has never smoked. She has been exposed to tobacco smoke. She has never used smokeless tobacco. Social: [name]  reports that she has never smoked. She has been exposed to tobacco smoke. She has never used smokeless tobacco. She reports that she does not drink alcohol and does not use drugs. Medical:  has a past medical history of Acid reflux, Anemia, Anxiety, Arthritis, Aspiration pneumonia (HCC) (06/02/2022), Asthma, CAP (community acquired pneumonia) (04/24/2022), Cervical disc disorder with radiculopathy of cervical region, Chronic pain syndrome, Chronic, continuous use of opioids, COPD (chronic obstructive pulmonary disease) (HCC), DOE (dyspnea on exertion), Hematuria, Hiatal hernia, History of kidney stones, Hypertension, Hypertensive urgency (05/12/2022), Hypoglycemia, Insomnia, LBBB (left bundle branch block), Migraine, MSSA bacteremia (04/19/2023), Murmur, Osteopenia, Palpitations, Pre-diabetes, Restless leg, Sepsis (HCC) (05/12/2022), Stenosis of cervical spine with myelopathy (HCC), and T2DM (type 2 diabetes mellitus) (HCC). Surgical: [name]  has a past surgical history that includes Ureteroscopy; Laparoscopic hysterectomy; Lithotripsy; HH repair; Cardiac electrophysiology study and ablation; Appendectomy (1990); Cholecystectomy (1990); Joint replacement (Bilateral, [id number]); Extracorporeal shock wave lithotripsy (Left, 09/12/2015); Tonsillectomy; Corneal transplant; Abdominal hysterectomy; Breast surgery; Fracture surgery; Amputation toe (Left, 07/31/2016); Excision bone cyst (Left,  07/31/2016); Colonoscopy with propofol  (N/A, 01/04/2017); Esophagogastroduodenoscopy (egd) with propofol  (N/A, 01/04/2017); Back surgery; Total knee revision (Right, 11/14/2019); periprosthetic supracondylar fracture of left femur (02/16/2020); Hardware Removal (Left, 11/14/2020); Esophagogastroduodenoscopy (N/A, 04/09/2021); Gum surgery (Left); Posterior cervical fusion/foraminotomy (N/A, 03/02/2023); Cervical wound debridement (N/A, 04/17/2023); and laparotomy (N/A, 11/27/2023). Family: family history includes Breast cancer in her maternal aunt; Diabetes in her brother; Hypertension in her brother and mother; Kidney Stones in her father; Prostate cancer in her father; Stroke in her mother.  Laboratory Chemistry Profile   Renal Lab Results  Component Value Date   BUN <5 (L) 11/30/2023   CREATININE 0.38 (L) 11/30/2023   BCR 17 01/15/2022   GFRAA >60 11/16/2019   GFRNONAA >60 11/30/2023    Hepatic Lab Results  Component Value Date   AST 15 11/27/2023   ALT 16 11/27/2023   ALBUMIN 2.5 (L) 11/27/2023   ALKPHOS 63 11/27/2023   LIPASE 19 11/26/2023    Electrolytes Lab Results  Component Value Date   NA 140 11/30/2023   K 3.5 11/30/2023   CL 103 11/30/2023   CALCIUM 7.9 (L) 11/30/2023   MG 1.9 11/30/2023   PHOS 2.9 04/18/2023    Bone Lab Results  Component Value Date   VD25OH 34.34 10/12/2023    Inflammation (CRP: Acute Phase) (ESR: Chronic Phase) Lab Results  Component Value Date   CRP 5.4 (H) 11/17/2023   ESRSEDRATE 63 (H) 11/17/2023   LATICACIDVEN 0.7 11/27/2023         Note: Above Lab results reviewed.  Recent Imaging Review  DG Abd Portable 1V CLINICAL DATA:  NG tube placement  EXAM: PORTABLE ABDOMEN - 1 VIEW  COMPARISON:  CT today  FINDINGS: NG tube tip near the GE junction, possibly within the hiatal hernia seen on CT.  IMPRESSION: NG tube tip likely within the hiatal hernia.  Electronically Signed  By: Franky Crease M.D.   On: 11/26/2023 23:26 CT  ABDOMEN PELVIS W CONTRAST CLINICAL DATA:  Abdominal pain, vomiting  EXAM: CT ABDOMEN AND PELVIS WITH CONTRAST  TECHNIQUE: Multidetector CT imaging of the abdomen and pelvis was performed using the standard protocol following bolus administration of intravenous contrast.  RADIATION DOSE REDUCTION: This exam was performed according to the departmental dose-optimization program which includes automated exposure control, adjustment of the mA and/or kV according to patient size and/or use of iterative reconstruction technique.  CONTRAST:  OMNIPAQUE  IOHEXOL  300 MG/ML  SOLN  COMPARISON:  04/24/2023  FINDINGS: Lower chest: Large hiatal hernia, stable.  No acute findings.  Hepatobiliary: No focal liver abnormality is seen. Status post cholecystectomy. No biliary dilatation. Spleen is enlarged measuring 20 cm in craniocaudal length. This could reflect hepatomegaly or a Riedel's lobe.  Pancreas: Fatty replacement. No focal abnormality or ductal dilatation.  Spleen: No focal abnormality.  Normal size.  Adrenals/Urinary Tract: 3 mm nonobstructing stone in the upper pole of the right kidney. No ureteral stones or hydronephrosis. No suspicious renal or adrenal abnormality. Urinary bladder unremarkable.  Stomach/Bowel: Sigmoid diverticulosis. No active diverticulitis. Abnormal appearing segments of distal small bowel in the pelvis with mild wall thickening and surrounding inflammation. There appears to be an area of narrowing and stricture best seen on images 40 6-47 of coronal series 5. This is immediately adjacent to the abnormally thickened appearing small bowel loop. Differential considerations would include adhesions in this area or inflammatory stricture. It is difficult to completely exclude internal hernia and ischemia. Small bowel loops proximal to this area are dilated with air-fluid levels compatible with a degree of small-bowel obstruction.  Vascular/Lymphatic: No  evidence of aneurysm or adenopathy.  Reproductive: Prior hysterectomy.  No adnexal masses.  Other: No free fluid or free air.  Musculoskeletal: No acute bony abnormality.  IMPRESSION: Abnormal appearing distal small bowel within the pelvis with wall thickening and surrounding haziness/inflammation. Differential considerations would include enteritis (infectious, inflammatory or ischemic). There appears to be a stricture/area of narrowing with resulting small bowel obstruction with proximal loops fluid-filled with scattered air-fluid levels. While this could be related to enteritis and stricture or adhesions, cannot exclude internal hernia. Recommend surgical consultation.  Liver measures enlarged at 20 cm in could reflect hepatomegaly or Riedel's lobe.  Large hiatal hernia.  These results were called by telephone at the time of interpretation on 11/26/2023 at 7:59 pm to provider KEVIN PADUCHOWSKI , who verbally acknowledged these results.  Electronically Signed   By: Franky Crease M.D.   On: 11/26/2023 19:59 Note: Reviewed        Physical Exam  Vitals: BP 120/68   Pulse (!) 118   Temp 99 F (37.2 C)   Resp 16   Ht 4' 11.5 (1.511 m)   Wt 107 lb (48.5 kg)   SpO2 98%   BMI 21.25 kg/m  BMI: Estimated body mass index is 21.25 kg/m as calculated from the following:   Height as of this encounter: 4' 11.5 (1.511 m).   Weight as of this encounter: 107 lb (48.5 kg). Ideal: Patient must be at least 60 in tall to calculate ideal body weight General appearance: Well nourished, well developed, and well hydrated. In no apparent acute distress Mental status: Alert, oriented x 3 (person, place, & time)       Respiratory: No evidence of acute respiratory distress Eyes: PERLA  Musculoskeletal: Left knee pain, neck pain Cervical Spine Exam  Skin & Axial Inspection:  No masses, redness, edema, swelling, or associated skin lesions Alignment: Symmetrical Functional ROM: Pain  restricted ROM, to the left Stability: No instability detected Muscle Tone/Strength: Functionally intact. No obvious neuro-muscular anomalies detected. Sensory (Neurological): Dermatomal pain pattern Palpation: No palpable anomalies             + Spurlings left   Upper Extremity (UE) Exam      Side: Right upper extremity   Side: Left upper extremity  Skin & Extremity Inspection: Skin color, temperature, and hair growth are WNL. No peripheral edema or cyanosis. No masses, redness, swelling, asymmetry, or associated skin lesions. No contractures.   Skin & Extremity Inspection: Skin color, temperature, and hair growth are WNL. No peripheral edema or cyanosis. No masses, redness, swelling, asymmetry, or associated skin lesions. No contractures.  Functional ROM: Unrestricted ROM           Functional ROM: Unrestricted ROM          Muscle Tone/Strength: Functionally intact. No obvious neuro-muscular anomalies detected.   Muscle Tone/Strength: Functionally intact. No obvious neuro-muscular anomalies detected.  Sensory (Neurological): Unimpaired           Sensory (Neurological): Dermatomal pain pattern          Palpation: No palpable anomalies               Palpation: No palpable anomalies              Provocative Test(s):  Phalen's test: deferred Tinel's test: deferred Apley's scratch test (touch opposite shoulder):  Action 1 (Across chest): deferred Action 2 (Overhead): deferred Action 3 (LB reach): deferred     Provocative Test(s):  Phalen's test: deferred Tinel's test: deferred Apley's scratch test (touch opposite shoulder):  Action 1 (Across chest): Decreased ROM Action 2 (Overhead): Decreased ROM Action 3 (LB reach): Decreased ROM    Assessment   Diagnosis Status  1. Primary osteoarthritis of left knee   2. Chronic pain of left knee   3. Chronic pain syndrome   4. Cervical radicular pain (left severe)   5. Foraminal stenosis of cervical region (severe b/l C5/6; C6/7)   6.  Medication management   7. Spinal stenosis in cervical region   8. Hx of long-term (current) use of anticoagulants    Persistent Persistent Controlled   Updated Problems: No problems updated.  Plan of Care  Problem-specific:  Assessment and Plan    Unilateral primary osteoarthritis, left knee with chronic pain: The patient continues experiencing severe left knee pain due to Primary osteoarthritis, which has been persistent and causes significant distress to the point of crying due to the intensity of pain. The left knee is the primary source of discomfort, while the right knee occasionally experiences mild discomfort. The pain has been ongoing, and she has previously undergone an x-ray at [facility]. She was scheduled for a scan, but insurance issues prevented it from being completed.   Chronic pain of the left knee: Chronic pain due to osteoarthritis requires intervention. Sedation necessary for procedures. - Schedule is scheduled for a genicular nerve block on January 31, 2024 - Instruct to stop Eliquis  three days prior to the procedure. - Prescribe hydrocodone  5-325 mg for pain management, limited to two doses.  Chronic pain syndrome: Patient's pain is well-controlled with hydrocodone , will continue on current medication regimen.  Prescribing drug monitoring (PMP) reviewed, findings consistent with the use of prescribed medication and no evidence of narcotic misuse or abuse.  No adverse reaction or side effects reported  to medication.  Urine drug screening (UDS) up to date and consistent with the use of prescribed medication.  The patient was advised to drink water  to prevent from opioid-induced constipation.  Schedule follow-up in 30 days for medication management.  Stop Eliquis  3 days prior to procedure  [name] has a current medication list which includes the following long-term medication(s): albuterol , apixaban , clonazepam , combivent  respimat, duloxetine ,  dupixent , fluticasone -salmeterol, gabapentin , ipratropium-albuterol , montelukast , pantoprazole , potassium chloride , ropinirole , sumatriptan , and trazodone .  Pharmacotherapy (Medications Ordered): Meds ordered this encounter  Medications   HYDROcodone -acetaminophen  (NORCO/VICODIN) 5-325 MG tablet    Sig: Take 1 tablet by mouth 2 (two) times daily as needed for severe pain (pain score 7-10).    Dispense:  60 tablet    Refill:  0    Refill please   Orders:  Orders Placed This Encounter  Procedures   Blood Thinner Instructions to Nursing    Always make sure patient has clearance from prescribing physician to stop blood thinners for interventional therapies. If the patient requires a Lovenox -bridge therapy, make sure arrangements are made to institute it with the assistance of the PCP.    Scheduling Instructions:     Have Summer Murphy stop the Eliquis  (Apixaban ) x 3      days, prior to procedure or surgery.        Return in about 1 month (around 02/23/2024) for (F2F), (MM), Emmy Blanch NP, (Blood thinner protocol).    Recent Visits Date Type Provider Dept  01/13/24 Office Visit Adham Johnson K, NP [facility]  01/04/24 Office Visit Marcelino Nurse, MD [facility]  11/03/23 Procedure visit Marcelino Nurse, MD [facility]  10/26/23 Office Visit Marcelino Nurse, MD [facility]  Showing recent visits within past 90 days and meeting all other requirements Today's Visits Date Type Provider Dept  01/24/24 Office Visit Daxten Kovalenko K, NP [facility]  Showing today's visits and meeting all other requirements Future Appointments Date Type Provider Dept  01/31/24 Appointment Marcelino Nurse, MD [facility]  02/15/24 Appointment Myles Tavella K, NP [facility]  Showing future appointments within next 90 days and meeting all other requirements  I discussed the assessment and treatment plan with the patient. The patient was provided  an opportunity to ask questions and all were answered. The patient agreed with the plan and demonstrated an understanding of the instructions.  Patient advised to call back or seek an in-person evaluation if the symptoms or condition worsens.  I personally spent a total of 30 minutes in the care of the patient today including preparing to see the patient, getting/reviewing separately obtained history, performing a medically appropriate exam/evaluation, counseling and educating, placing orders, referring and communicating with other health care professionals, documenting clinical information in the EHR, independently interpreting results, communicating results, and coordinating care.   Note by: Avonte Sensabaugh K Kaelee Pfeffer, NP (TTS and AI technology used. I apologize for any typographical errors that were not detected and corrected.) Date: 01/24/2024; Time: 11:25 AM

## 2024-01-24 NOTE — Patient Instructions (Addendum)
 Stop taking Eliquis  3 days before procedure.  Moderate Conscious Sedation, Adult Sedation is the use of medicines to help you relax and not feel pain. Moderate conscious sedation is a type of sedation that makes you less alert than normal. You are still able to respond to instructions, touch, or both. This type of sedation is used during short medical and dental procedures. It is milder than deep sedation, which is a type of sedation you cannot be easily woken up from. It is also milder than general anesthesia, which is the use of medicines to make you fall asleep. Moderate conscious sedation lets you return to your normal activities sooner. Tell a health care provider about: Any allergies you have. All medicines you are taking, including vitamins, herbs, steroids, eye drops, creams, and over-the-counter medicines. Any problems you or family members have had with anesthesia. Any bleeding problems you have. Any surgeries you have had. Any medical conditions you have. Whether you are pregnant or may be pregnant. Any recent alcohol, tobacco, or drug use. What are the risks? Your health care provider will talk with you about risks. These may include: Oversedation. This is when you get too much medicine. Nausea or vomiting. Allergic reaction to medicines. Trouble breathing. If this happens, a breathing tube may be used. It will be removed when you can breathe better on your own. Heart trouble. Lung trouble. Emergence delirium. This is when you feel confused while the sedation wears off. This gets better with time. What happens before the procedure? When to stop eating and drinking Follow instructions from your health care provider about what you may eat and drink. These may include: 8 hours before your procedure Stop eating most foods. Do not eat meat, fried foods, or fatty foods. Eat only light foods, such as toast or crackers. All liquids are okay except energy drinks and alcohol. 6 hours  before your procedure Stop eating. Drink only clear liquids, such as water , clear fruit juice, black coffee, plain tea, and sports drinks. Do not drink energy drinks or alcohol. 2 hours before your procedure Stop drinking all liquids. You may be allowed to take medicines with small sips of water . If you do not follow your health care provider's instructions, your procedure may be delayed or canceled. Medicines Ask your health care provider about: Changing or stopping your regular medicines. These include any diabetes medicines or blood thinners you take. Taking medicines such as aspirin and ibuprofen . These medicines can thin your blood. Do not take them unless your health care provider tells you to. Taking over-the-counter medicines, vitamins, herbs, and supplements. Tests and exams You may have an exam or testing. You may have a blood or urine sample taken. General instructions Do not use any products that contain nicotine or tobacco for at least 4 weeks before the procedure. These products include cigarettes, chewing tobacco, and vaping devices, such as e-cigarettes. If you need help quitting, ask your health care provider. If you will be going home right after the procedure, plan to have a responsible adult: Take you home from the hospital or clinic. You will not be allowed to drive. Care for you for the time you are told. What happens during the procedure?  You will be given the sedative. It may be given: As a pill you can take by mouth. It can also be put into the rectum. As a spray through the nose. As an injection into muscle. As an injection into a vein through an IV. You may be  given oxygen  as needed. Your blood pressure, heart rate, breathing rate, and blood oxygen  level will be monitored during the procedure. The medical or dental procedure will be done. The procedure may vary among health care providers and hospitals. What happens after the procedure? Your blood  pressure, heart rate, breathing rate, and blood oxygen  level will be monitored until you leave the hospital or clinic. You will get fluids through an IV as needed. Do not drive or operate machinery until your health care provider says that it is safe. This information is not intended to replace advice given to you by your health care provider. Make sure you discuss any questions you have with your health care provider. Document Revised: 09/22/2021 Document Reviewed: 09/22/2021 Elsevier Patient Education  2024 [employer].GENERAL RISKS AND COMPLICATIONS  What are the risk, side effects and possible complications? Generally speaking, most procedures are safe.  However, with any procedure there are risks, side effects, and the possibility of complications.  The risks and complications are dependent upon the sites that are lesioned, or the type of nerve block to be performed.  The closer the procedure is to the spine, the more serious the risks are.  Great care is taken when placing the radio frequency needles, block needles or lesioning probes, but sometimes complications can occur. Infection: Any time there is an injection through the skin, there is a risk of infection.  This is why sterile conditions are used for these blocks.  There are four possible types of infection. Localized skin infection. Central Nervous System Infection-This can be in the form of Meningitis, which can be deadly. Epidural Infections-This can be in the form of an epidural abscess, which can cause pressure inside of the spine, causing compression of the spinal cord with subsequent paralysis. This would require an emergency surgery to decompress, and there are no guarantees that the patient would recover from the paralysis. Discitis-This is an infection of the intervertebral discs.  It occurs in about 1% of discography procedures.  It is difficult to treat and it may lead to surgery.        2. Pain: the needles have to go  through skin and soft tissues, will cause soreness.       3. Damage to internal structures:  The nerves to be lesioned may be near blood vessels or    other nerves which can be potentially damaged.       4. Bleeding: Bleeding is more common if the patient is taking blood thinners such as  aspirin, Coumadin, Ticiid, Plavix, etc., or if he/she have some genetic predisposition  such as hemophilia. Bleeding into the spinal canal can cause compression of the spinal  cord with subsequent paralysis.  This would require an emergency surgery to  decompress and there are no guarantees that the patient would recover from the  paralysis.       5. Pneumothorax:  Puncturing of a lung is a possibility, every time a needle is introduced in  the area of the chest or upper back.  Pneumothorax refers to free air around the  collapsed lung(s), inside of the thoracic cavity (chest cavity).  Another two possible  complications related to a similar event would include: Hemothorax and Chylothorax.   These are variations of the Pneumothorax, where instead of air around the collapsed  lung(s), you may have blood or chyle, respectively.       6. Spinal headaches: They may occur with any procedures in the area of the  spine.       7. Persistent CSF (Cerebro-Spinal Fluid) leakage: This is a rare problem, but may occur  with prolonged intrathecal or epidural catheters either due to the formation of a fistulous  track or a dural tear.       8. Nerve damage: By working so close to the spinal cord, there is always a possibility of  nerve damage, which could be as serious as a permanent spinal cord injury with  paralysis.       9. Death:  Although rare, severe deadly allergic reactions known as Anaphylactic  reaction can occur to any of the medications used.      10. Worsening of the symptoms:  We can always make thing worse.  What are the chances of something like this happening? Chances of any of this occuring are extremely low.  By  statistics, you have more of a chance of getting killed in a motor vehicle accident: while driving to the hospital than any of the above occurring .  Nevertheless, you should be aware that they are possibilities.  In general, it is similar to taking a shower.  Everybody knows that you can slip, hit your head and get killed.  Does that mean that you should not shower again?  Nevertheless always keep in mind that statistics do not mean anything if you happen to be on the wrong side of them.  Even if a procedure has a 1 (one) in a 1,000,000 (million) chance of going wrong, it you happen to be that one..Also, keep in mind that by statistics, you have more of a chance of having something go wrong when taking medications.  Who should not have this procedure? If you are on a blood thinning medication (e.g. Coumadin, Plavix, see list of Blood Thinners), or if you have an active infection going on, you should not have the procedure.  If you are taking any blood thinners, please inform your physician.  How should I prepare for this procedure? Do not eat or drink anything at least six hours prior to the procedure. Bring a driver with you .  It cannot be a taxi. Come accompanied by an adult that can drive you back, and that is strong enough to help you if your legs get weak or numb from the local anesthetic. Take all of your medicines the morning of the procedure with just enough water  to swallow them. If you have diabetes, make sure that you are scheduled to have your procedure done first thing in the morning, whenever possible. If you have diabetes, take only half of your insulin  dose and notify our nurse that you have done so as soon as you arrive at the clinic. If you are diabetic, but only take blood sugar pills (oral hypoglycemic), then do not take them on the morning of your procedure.  You may take them after you have had the procedure. Do not take aspirin or any aspirin-containing medications, at least  eleven (11) days prior to the procedure.  They may prolong bleeding. Wear loose fitting clothing that may be easy to take off and that you would not mind if it got stained with Betadine or blood. Do not wear any jewelry or perfume Remove any nail coloring.  It will interfere with some of our monitoring equipment.  NOTE: Remember that this is not meant to be interpreted as a complete list of all possible complications.  Unforeseen problems may occur.  BLOOD THINNERS The following drugs contain  aspirin or other products, which can cause increased bleeding during surgery and should not be taken for 2 weeks prior to and 1 week after surgery.  If you should need take something for relief of minor pain, you may take acetaminophen  which is found in Tylenol ,m Datril, Anacin-3 and Panadol. It is not blood thinner. The products listed below are.  Do not take any of the products listed below in addition to any listed on your instruction sheet.  A.P.C or A.P.C with Codeine Codeine Phosphate Capsules #3 Ibuprofen  Ridaura  ABC compound Congesprin Imuran rimadil  Advil  Cope Indocin Robaxisal  Alka-Seltzer Effervescent Pain Reliever and Antacid Coricidin or Coricidin-D  Indomethacin Rufen   Alka-Seltzer plus Cold Medicine Cosprin Ketoprofen S-A-C Tablets  Anacin Analgesic Tablets or Capsules Coumadin Korlgesic Salflex  Anacin Extra Strength Analgesic tablets or capsules CP-2 Tablets Lanoril Salicylate  Anaprox Cuprimine Capsules Levenox Salocol  Anexsia-D Dalteparin Magan Salsalate  Anodynos Darvon compound Magnesium  Salicylate Sine-off  Ansaid Dasin Capsules Magsal Sodium Salicylate  Anturane Depen Capsules Marnal Soma  APF Arthritis pain formula Dewitt's Pills Measurin Stanback  Argesic Dia-Gesic Meclofenamic Sulfinpyrazone  Arthritis Bayer Timed Release Aspirin Diclofenac  Meclomen Sulindac  Arthritis pain formula Anacin Dicumarol Medipren  Supac  Analgesic (Safety coated) Arthralgen Diffunasal Mefanamic  Suprofen  Arthritis Strength Bufferin Dihydrocodeine Mepro Compound Suprol  Arthropan liquid Dopirydamole Methcarbomol with Aspirin Synalgos  ASA tablets/Enseals Disalcid Micrainin Tagament  Ascriptin Doan's Midol  Talwin  Ascriptin A/D Dolene Mobidin Tanderil  Ascriptin Extra Strength Dolobid Moblgesic Ticlid  Ascriptin with Codeine Doloprin or Doloprin with Codeine Momentum Tolectin  Asperbuf Duoprin Mono-gesic Trendar  Aspergum Duradyne Motrin  or Motrin  IB Triminicin  Aspirin plain, buffered or enteric coated Durasal Myochrisine Trigesic  Aspirin Suppositories Easprin Nalfon Trillsate  Aspirin with Codeine Ecotrin Regular or Extra Strength Naprosyn Uracel  Atromid-S Efficin Naproxen Ursinus  Auranofin Capsules Elmiron Neocylate Vanquish  Axotal Emagrin Norgesic Verin  Azathioprine Empirin or Empirin with Codeine Normiflo Vitamin E  Azolid Emprazil Nuprin  Voltaren   Bayer Aspirin plain, buffered or children's or timed BC Tablets or powders Encaprin Orgaran Warfarin Sodium  Buff-a-Comp Enoxaparin  Orudis Zorpin  Buff-a-Comp with Codeine Equegesic Os-Cal-Gesic   Buffaprin Excedrin plain, buffered or Extra Strength Oxalid   Bufferin Arthritis Strength Feldene Oxphenbutazone   Bufferin plain or Extra Strength Feldene Capsules Oxycodone  with Aspirin   Bufferin with Codeine Fenoprofen Fenoprofen Pabalate or Pabalate-SF   Buffets II Flogesic Panagesic   Buffinol plain or Extra Strength Florinal or Florinal with Codeine Panwarfarin   Buf-Tabs Flurbiprofen Penicillamine   Butalbital  Compound Four-way cold tablets Penicillin   Butazolidin Fragmin Pepto-Bismol   Carbenicillin Geminisyn Percodan   Carna Arthritis Reliever Geopen Persantine   Carprofen Gold's salt Persistin   Chloramphenicol Goody's Phenylbutazone   Chloromycetin Haltrain Piroxlcam   Clmetidine heparin  Plaquenil   Cllnoril Hyco-pap Ponstel   Clofibrate Hydroxy chloroquine Propoxyphen         Before stopping any of these  medications, be sure to consult the physician who ordered them.  Some, such as Coumadin (Warfarin) are ordered to prevent or treat serious conditions such as deep thrombosis, pumonary embolisms, and other heart problems.  The amount of time that you may need off of the medication may also vary with the medication and the reason for which you were taking it.  If you are taking any of these medications, please make sure you notify your pain physician before you undergo any procedures.           Selective Nerve Root Block Patient  Information  Description: Specific nerve roots exit the spinal canal and these nerves can be compressed and inflamed by a bulging disc and bone spurs.  By injecting steroids on the nerve root, we can potentially decrease the inflammation surrounding these nerves, which often leads to decreased pain.  Also, by injecting local anesthesia on the nerve root, this can provide us  helpful information to give to your referring doctor if it decreases your pain.  Selective nerve root blocks can be done along the spine from the neck to the low back depending on the location of your pain.   After numbing the skin with local anesthesia, a small needle is passed to the nerve root and the position of the needle is verified using x-ray pictures.  After the needle is in correct position, we then deposit the medication.  You may experience a pressure sensation while this is being done.  The entire block usually lasts less than 15 minutes.  Conditions that may be treated with selective nerve root blocks: Low back and leg pain Spinal stenosis Diagnostic block prior to potential surgery Neck and arm pain Post laminectomy syndrome  Preparation for the injection:  Do not eat any solid food or dairy products within 8 hours of your appointment. You may drink clear liquids up to 3 hours before an appointment.  Clear liquids include water , black coffee, juice or soda.  No milk or cream  please. You may take your regular medications, including pain medications, with a sip of water  before your appointment.  Diabetics should hold regular insulin  (if taken separately) and take 1/2 normal NPH dose the morning of the procedure.  Carry some sugar containing items with you to your appointment. A driver must accompany you and be prepared to drive you home after your procedure. Bring all your current medications with you. An IV may be inserted and sedation may be given at the discretion of the physician. A blood pressure cuff, EKG, and other monitors will often be applied during the procedure.  Some patients may need to have extra oxygen  administered for a short period. You will be asked to provide medical information, including allergies, prior to the procedure.  We must know immediately if you are taking blood  Thinners (like Coumadin) or if you are allergic to IV iodine contrast (dye).  Possible side-effects: All are usually temporary Bleeding from needle site Light headedness Numbness and tingling Decreased blood pressure Weakness in arms/legs Pressure sensation in back/neck Pain at injection site (several days)  Possible complications: All are extremely rare Infection Nerve injury Spinal headache (a headache wore with upright position)  Call if you experience: Fever/chills associated with headache or increased back/neck pain Headache worsened by an upright position New onset weakness or numbness of an extremity below the injection site Hives or difficulty breathing (go to the emergency room) Inflammation or drainage at the injection site(s) Severe back/neck pain greater than usual New symptoms which are concerning to you  Please note:  Although the local anesthetic injected can often make your back or neck feel good for several hours after the injection the pain will likely return.  It takes 3-5 days for steroids to work on the nerve root. You may not notice any  pain relief for at least one week.  If effective, we will often do a series of 3 injections spaced 3-6 weeks apart to maximally decrease your pain.    If you have any questions, please call [phone] [facility] ______________________________________________________________________    Update on Controlled Substance (Opioid) Regulations   To: All patients taking opioid pain medications. (I.e.: hydrocodone , hydromorphone , oxycodone , oxymorphone, morphine, codeine, methadone, tapentadol, tramadol , buprenorphine, fentanyl , etc.)  Re: Review on the state of controlled substance regulations.  Introduction: Rules and regulations associated with all aspects of controlled substances are constantly being modified. Unfortunately we have encountered patients questioning the veracity of the information that we provide them about these changes. This is intended to provide them with appropriate references and a historical review of these changes.  A Brief History: As of January 06, 2016, the [place]  Government declared the opioid epidemic a public health emergency. Prescription drug monitoring programs (PDMPs) and the [facility] All Schedules Prescription Electronic Reporting Act (NASPER). Before 1800, clinicians regarded pain as an existential phenomenon, a consequence of aging. There was no regulation on the use of cocaine and opioids, resulting in widespread marketing and prescribing for many ailments ranging from diarrhea to toothache. The [employer] of [phone], passed in response to the sudden emergence of street heroin abuse as well as iatrogenic morphine dependence, influenced both physician and patient alike to avoid opiates. Patients with unexplained pain in the 1920s were regarded as deluded, malingering, or abusers, and cancer patients through the 1950s were encouraged to wean themselves off opioids until their lives "could be measured in weeks". Alongside  this opioid evolution, the American Pain Society launched their influential "pain as the fifth vital sign" campaign in 1995. Concurrently, pharmaceutical companies introduced new formulations, such as extended release oxycodone  (OxyContin ). From 1997 to 2002, OxyContin  prescriptions increased from 670,000 to 6.2 million. However, concerns soon began to surface regarding overzealous opioid treatment. It must be noted that pharmaceutical companies contributed significantly to the rise of the opioid epidemic, receiving considerable reprimands as a consequence. In 2007, as the opioid epidemic began to inflict profound damage, [employer] pleaded guilty to federal charges related to the misbranding of OxyContin . [employer] agreed to pay a total of $634.5 million to resolve Justice Department investigations, as well as a $19.5 million settlement to [address] and the [address] of [place].  In response to the current epidemic, changes in focus to the development of new abuse deterrent opioid formulations at the [place]  Food and Drug Administration (FDA) as well as drafting of new public standards for pain treatment were created at [facility] in 2017. In response to the opioid epidemic, FDA public policy changes were announced in February 2016. Among these new positions were a re-examination of the risk-benefit paradigm for opioids with strict emphasis on the large public health ramifications. The various modified opioids released over the past 20 years, such as tamper-resistant preparation, have had differing levels of success, and are collectively referred to as Risk Evaluation and Mitigation Strategies (REMS). There is also a growing focus on preventing opioid use disorder (OUD) and on offering affected individuals accessible and effective treatment. [place]  government policy reflects these changes and both the Affordable Care Act and the Mental Health Parity and Addiction Equity Act were major steps forward in treating opioid addiction.  The Affordable Care Act, which was signed into law in 2010, with major provisions coming into effect by 2014.  In the 1990s, the intensified marketing of newly reformulated prescription opioid medications (e.g., OxyContin ) and an influential pain advocacy campaign that encouraged greater pain management led to a precipitous rise in opioid use in the [place] . Research from the Centers for Disease Control and Prevention (CDC) shows that prescription opioid sales  in the [place]  quadrupled from 02/07/98 to 07-Feb-2009. At the same time, opioid misuse and opioid-involved overdose deaths increased (Figure 1). Between 02/07/1998 and 02/07/09, the rate of opioidinvolved overdose deaths in the [place]  doubled from 2.9 to 6.8 deaths per 100,000 people. This initial rise in opioid-related deaths is often referred to as the first wave of the recent opioid crisis.  Between Feb 07, 1998 and 02/08/2019, 565,000 Americans died of opioid-involved overdoses. In turn, federal, state, and local governments responded with various legal and policy efforts to curb opioid misuse and drug-related overdose Deaths.  Recent Congresses have enacted several laws addressing the opioid crisis, such as the Comprehensive Addiction and Recovery Act of 02-08-15 (CARA, P.L. 114-198); the 21st Century Cures Act (P.L. 114-255); the Substance UseDisorder Prevention that Promotes Opioid Recovery and Treatment for Patients and Communities Act (SUPPORT Act, P.L. 952-441-2346); the Fentanyl  Sanctions Act (Title LXXII of P.L. [id number]); and the Blocking Deadly Fentanyl  Imports Act (P.L. 117-81, 6610). These laws addressed overprescribing and misuse of opioids, expanded substance use disorder prevention and treatment capacities, bolstered drug diversion capabilities, and enhanced international drug interdiction, counternarcotics cooperation, and sanctions efforts. Congress also directed additional funds to many of these initiatives through  appropriations.  Congress provided funding in the [employer] Act of 02/08/20 [phone]; P.L. 117-2) for syringe services programs (often known as needle exchange programs) and other harm reduction initiatives. Federal and state harm reduction strategies have frequently involved the distribution of naloxone (e.g., Narcan)--a medication used to reverse an opioid overdose--and test strips used to detect fentanyl  in drug samples.  The Department of Justice (DOJ) and Department of Homeland Security [facility]) aim to reduce the diversion of prescription opioids and the use, manufacturing, and trafficking of illicit opioids. DOJ--via the Drug Enforcement Administration (DEA)--regulates opioid manufacturers, distributors, and dispensers; it also controls the opioid supply through enforcement of regulatory requirements.  A History of Opiate Laws in the [place]   Prior to 02/07/89, laws concerning opiates were strictly imposed on a local city or state-by-state basis. One of the first was in [place] in February 07, 1874 where it became illegal to smoke opium only in opium dens. It did not ban the sale, import or use otherwise. In the next 25 years different states enacted opium laws ranging from outlawing opium dens altogether to making possession of opium, morphine and heroin without a physician's prescription illegal.  The first Congressional Act took place in 1889/02/07 that levied taxes on morphine and opium. From that time on the [employer] has had a series of laws and acts directly aimed at opiate use, abuse and control. These are outlined below:  1906 - Pure Food and Drug Act Preventing the manufacture, sale, or transportation of adulterated or misbranded or poisonous or deleterious foods, drugs, medicines, and liquors, and for regulating traffic therein, and for other purposes. Punishment included fines and prison time.  1909   - Smoking Opium Exclusion Act Banned the importation, possession and  use of smoking opium. Did not regulate opium-based medications. First Freight forwarder banning the non-medical use of a substance.  1914  - The Margrette Act In summary, The Margrette Act of 07-Feb-1913 was written more to have all parties involved in importing, exporting, set designer and distributing opium or cocaine to register with the [employer] and have taxes levied upon them. Exempt from the law were physicians operating "in the course of his professional practice"  02-07-18 - Supreme Court ratified the Bj's in [facility] v. [place]  [place] and [place]  v. [place], then again in [facility] v. [place] , in 1920, holding that doctors may not prescribe maintenance supplies of narcotics to people addicted to narcotics. However, it does not prohibit doctors from prescribing narcotics to wean a patient off of the drug. It was also the opinion of the court that prescribing narcotics to habitual users was not considered "professional practice" hence it then was considered illegal for doctors to prescribe opioids for the purposes of maintaining an addiction. It can be argued that today's addiction medications are not intended to maintain an addiction but to facilitate addiction remission. In which case, this opinion of the court should not preclude practitioners from prescribing buprenorphine or methadone to patients suffering from an addictive disorder.  1924  - Heroin Act Architectural technologist, importation and possession of heroin illegal - even for medicinal use.  1922 -- Narcotic Drug Import and Export Act Enacted to assure proper control of importation, sale, possession, production and consumption of narcotics.  1927  -- Special Educational Needs Teacher of Prohibition [employer] of Prohibition was responsible for tracking bootleggers and organized conservation officer, historic buildings. They focused primarily on interstate and international cases and those cases where local law enforcement official would not or could  not act.  1932 -- Uniform State Narcotic Act Encouraged states to pass uniform state laws matching the federal Narcotic Drug Import and Export Act. Suggested prohibiting cannabis use at the state level.  74 -- Food, Drug, and Cosmetic Act The new law brought cosmetics and medical devices under control, and it required that drugs be labeled with adequate directions for safe use. Moreover, it mandated pre-market approval of all new drugs, such that a manufacturer would have to prove to FDA that a drug were safe before it could be sold  1951 -- Boggs Act Imposed maximum criminal penalties for violations of the import/export and internal revenue laws related to drugs and also established mandatory minimum prison sentences.  1956 -- Narcotics Control Act Increased Boggs Act penalties and mandatory prison sentence minimums for violations of existing drug laws.  1965 -- Drug Abuse Control Amendment Enacted to deal with problems caused by abuse of depressants, stimulants and hallucinogens. Restricted research into psychoactive drugs such as LSD by requiring FDA approval.  1970 -- Controlled Substance Act  Controlled Substances Import and Export Act These laws are a consolidation of numerous laws regulating the manufacture and distribution of narcotics, stimulants, depressants, hallucinogens, anabolic steroids, and chemicals used in the illicit production of controlled substances. The CSA places all substances that are regulated under existing federal law into one of five schedules. This placement is based upon the substance's medicinal value, harmfulness, and potential for abuse or addiction. Schedule I is reserved for the most dangerous drugs that have no recognized medical use, while Schedule V is the classification used for the least dangerous drugs. The act also provides a mechanism for substances to be controlled, added to a schedule, decontrolled, removed from control, rescheduled, or transferred  from one schedule to another.  34 - Drug Enforcement Agency By Executive Order, the DEA was formed to take place of the [employer] of Narcotics and Dangerous Drugs.  57 -Narcotic Addict Treatment Act of  1974  - Public Law [phone] Amends the Controlled Substance Act of 1970 to provide for the registration of practitioners conducting narcotic treatment programs. [methadone clinics] It also provides legal definitions for the phrases "maintenance treatment" and "detoxification treatment".  1986 -- Anti-Drug Abuse Act of [employer] efforts  to encourage foreign cooperation in eradicating illicit drug crops and in halting international drug traffic, to improve enforcement of Federal drug laws and enhance interdiction of illicit drug shipments, to provide strong Market researcher in establishing effective drug abuse prevention and education programs, to expand Federal support for drug abuse treatment and rehabilitation efforts, and for other purposes. It also re-imposed mandatory sentencing minimums depending on which drug and how much was involved.  1988 -- Anti-Drug Abuse Act of 1988 Established the Office of Materials Engineer (ONDCP) in the [employer] of the Economist; authorized funds for [employer], state and local drug enforcement activities, school-based drug prevention efforts, and drug abuse treatment with special emphasis on injecting drug abusers at high risk for AIDS.  2000 -- Federal - The Drug Addiction Treatment Act of 2000 (DATA 2000) It enables qualified physicians to prescribe and/or dispense narcotics for the purpose of treating opioid dependency. For the first time, physicians are able to treat this disease from their private offices or other clinical settings. This presents a very desirable treatment option for those who are unwilling or unable to seek help in drug treatment clinics. Patients can now be treated in the privacy of their doctor's office, as are  other people being treated for any other type of medical condition. One medicine doctors may now prescribe is Buprenorphine. The major downfall of this Act is the limitation of 30 patients per practice - which means that large facilities, no matter how many physicians are there, can only treat 30 patients at a time.  2002-- DEA reschedules buprenorphine from a schedule V drug to a schedule III drug, on December 27, 2000 - the day before the FDA approval of Suboxone and Subutex despite overwhelming objection by the medical community.  2004: June 2004 THE CONFIDENTIALITY OF ALCOHOL AND DRUG ABUSE PATIENT RECORDS REGULATION AND THE HIPAA PRIVACY RULE:  Confidentiality of Alcohol and Drug Dependence Patient Records (summary) Code of Federal Regulations Title 42 Part 2 (42 CFR Part 2)  The confidentiality of alcohol and drug dependence patient records maintained by this practice/program is protected by federal law and regulations. Generally, the practice/program may not say to a person outside the practice/program that a patient attends the practice/program, or disclose any information identifying a patient as being alcohol or drug dependent unless:  The patient consents in writing; The disclosure is allowed by a court order, or The disclosure is made to medical personnel in a medical emergency or to qualified personnel for research,  audit, or practice/program evaluation. Violation of the federal law and regulations by a practice/program is a crime. Suspected violations may be reported to appropriate authorities in accordance with federal regulations. Freight forwarder and regulations do not protect any information about a crime committed by a patient either at the practice/program or against any person who works for the practice/program or about any threat to commit such a crime. Federal laws and regulations do not protect any information about suspected child abuse or neglect from being reported under state law  to appropriate state or local authorities.  sample consent form (MS-WORD)  2005: 10-23-2003 Public law [phone], Amends the Controlled Substances Act to eliminate the 30-patient limit for medical group practices allowed to dispense narcotic drugs in schedules III, IV, or V for maintenance or detoxification treatment (retains the 30-patient limit for an individual physician). This amendment removes the 30-patient limit on group medical practices that treat opioid dependence with buprenorphine. The restriction was part of the original Drug Addiction  Treatment Act of 2000 (DATA) that allowed treatment of opioid dependence in a doctor's office. With this change, every certified doctor may now prescribe buprenorphine up to his or her individual physician limit of 30 patients.  2006: On 03/20/2005 President Levy signed Bill H.R.6344 into law. This allows physicians who have been certified to prescribe certain drugs for the treatment of opioid dependence under DATA2000 to treat up to 100 patients (up from 30) by submitting an intent notification to the Dept of Health and [employer]. This is a major step forward in both fighting the stigma and allowing access to treatment previously not available to some. For more details see 30/100-PATIENT LIMIT  2016: HHS augments regulations concerning the 30/100 patient limit by raising the limit to 275 for qualifying physicians. Link to summary of regulation  2016: Comprehensive Addiction and Recovery Act of 2016 (sec.303) amends the Controlled Substance Act - to allow Nurse Practitioners and Physician Assistants to become eligible to prescribe buprenorphine for the treatment of opioid use disorder. See the entire law for more details.  The roots of the concurrent regulation of certain drugs under two statutory schemes go back to the beginning of this century. In 1906, Congress enacted the Pure Food and Drug Act, establishing one regime of regulation to assure (among  other things) that drugs were not adulterated or misbranded. These regulations were amended several times, recodified in 1938, and expanded on again from the 1940s through the 1990s. Their implementation and enforcement is today assigned to the Food and Drug Administration (FDA) in the Department of Health and Human Services [facility]).  In 1914, Congress adopted the [place] Narcotic Act to stop abuse of addictive drugs. The Margrette Narcotic Act was amended in 1937 to include marijuana. In 1965, amphetamines, barbiturates, and hallucinogens came under regulation, but under the [employer], Drug, and Cosmetic Act. In 1970, these various statutes were consolidated and recodified as the Controlled Substances Act (CSA), which has been amended several times since then. Its implementation and enforcement is today assigned to the Drug Enforcement Administration (DEA) in the Department of Justice.  The first clash occurred after World War I, when so-called morphine clinics existed and physicians prescribed or dispensed morphine to addicts. Some addicts were veterans of the American Civil War, the Spanish-American War, and WWI, who had become addicted during treatment for war wounds, but most of them came from the growing population of nonmedical addicts ([place], 8017). The Narcotics Division of the Prohibition Unit of the Department of the Treasury, which was then responsible for enforcing the [facility] Narcotic Act, concluded that this activity was not the legitimate practice of medicine but simple drug trafficking. The Treasury Department swiftly closed the clinics and made it personally and professionally risky for physicians to maintain a narcotic addict for any reason. In did so, however, only after the American Medical Association had adopted a resolution, in 1920, opposing ambulatory clinics''.  In 1972, the public health establishment, including the Secretary of Health, Education, and Welfare, the Retail Banker, the [employer] of [employer], and the Chemical Engineer for Drug Abuse Prevention, was unprepared to allow [employer] of Narcotics and Dangerous Drugs, DEA's predecessor agency, to unilaterally define the parameters of medical practice for the use of methadone in the treatment of heroin addiction. As a consequence, a new set of rules--the third, on top of the FDA and DEA schemes--was added, one that inserted FDA deeply into the practice of medicine, notwithstanding its protestations to  the contrary. Congress ratified this joint responsibility of law enforcement and public health officials for methadone through this third set of rules in 1974 with the passage of the Narcotic Addict Treatment Act (NATA). To examine in detail the evolution of this third set of rules--commonly referred to as the FDA or DHHS methadone regulations--we turn, first, to the period of the mid-1960s.  Increased use of heroin in the post World War II period first became apparent in the early to mid 1950s. During the [employer], a minimum mandatory narcotics law was enacted in 1956, effective July 1957. 1962 [facility] conference on drug abuse, the [employer] on Narcotic and Drug Abuse (the [employer]) of 1963, the Drug Abuse Control Amendments of 1965, the President's Commission on [employer] and Administration of Justice (the [employer]) of [phone], and the Narcotic Addiction Rehabilitation Act of 1966.  The 1965 Drug Abuse Control Amendments brought under strict federal control all nonnarcotic drugs capable of producing serious psychotoxic effects when abused. This act also created the [employer] of Drug Abuse Control within the Department of Health, Education, and Welfare (DHEW) and shifted the basis for aon corporation of illegal drugs from tax principles (administered by the Department of Treasury) to the regulation of commerce  (administered by the [employer]).  The 1966 Narcotic Addiction Rehabilitation Act TOUR MANAGER) authorized the civil commitment of narcotic addicts, and federal assistance to state and local governments to develop a local system of drug treatment programs. With respect to the latter, the [employer] of Mental Health [facility]) initially proposed the gradual implementation of the state assistance effort, mainly through a common mental health mechanism--inpatient treatment programs. However, because of a perceived pressing need, the courts began to commit addicts to these programs even before they were officially opened or staffed. The NARA legislation imposed the following contract requirements on treatment centers: (1) thrice-a-week counseling sessions; (2) weekly urine tests; (3) restorative dental services; (4) psychological consultations and vocational training; and (5) the treatment modalities of drug-free outpatient, therapeutic community, and methadone maintenance. Reorganization Plan No. 1 of 1968 transferred the primary functions of the [employer] of Narcotics (FBN) from the [employer] to the Department of Justice; it also transferred the [employer] of Drug Abuse Control functions to the Department of Justice. Within the [employer], the [employer] of Narcotics and Dangerous Drugs (BNDD) was created, which became the Drug Enforcement Administration in 1973.   Under the first [place] administration [phone]), federal drug abuse policy developed in a significant way. These developments included a 1969 war on drugs presidential message, resulting legislation in 1970, and a Special Action Office created by executive order in 1971 and authorized in statute in 1972. Brynn, in 1969, to send a message to Congress on drug abuse. Although this was the first time that a U.[place]. president invoked the war on drugs image, it was in retrospect the most balanced approach to the problem of drug abuse that had  been advanced. The 1969 message resulted in the submission of legislation to the Congress and the passage, the following year, of the Comprehensive Drug Abuse Prevention and Control Act of 1970 [employer] [phone], January 16, 1969). The act dealt with research, treatment, and prevention of drug abuse and drug dependence, and with drug abuse charity fundraiser. One major purpose of the 1970 legislation was to reverse some of the strictures of the [employer] of 1914. The 1970 act sought to clarify for the medical profession . SABRA SABRA  the extent to which they may safely go in treating narcotic addicts as patients. Title I, in Section IV, charged the Surveyor, Minerals, Education, and Welfare, to determine the appropriate methods of professional practice in the medical treatment of the narcotic addiction of various classes of narcotic addicts. This provision constitutes the initial statutory basis for treatment standards. The law enforcement sections consolidated all prior federal statutes into the Controlled Substances Act and the Controlled Substances Export and Import Act (Titles II and III, respectively, of the Comprehensive Drug Abuse Prevention and Control Act of 1970). Under this legislation, substances were classified under five schedules according to their abuse potential, and psychological and physical effects. Methadone was placed in Schedule II, along with such opiate drugs as morphine, codeine, and hydrocodone .  One of the most important steps taken by President Brynn was to establish in June 1971 the Special Action Office for Drug Abuse Prevention (SAODAP) in the [employer] of the President ([place] [phone], September 06, 1969). In mid-1971, [facility] appointed Dr. Maple Dunnings as SAODAP director. Within a year, the Drug Abuse Prevention Office and Treatment Act of 1972 [employer] [phone], June 11, 1970) gave statutory authority to [facility], but limiting setting, on September 19, 1973, as the  limit on its existence.  The purpose of the 1972 act was to bring the resources of the federal government to bear on drug abuse with the immediate objective of significantly reducing its incidence and developing a comprehensive, coordinated long-term federal strategy to combat drug abuse.  Narcotic Addict Treatment Act [employer]TER[employer]) of 1974 [employer] [phone]), which amended the Controlled Substances Act. This legislation was driven by concern for the diversion of methadone to illicit channels that was occurring in 1972 and 1973, as reflected in the title of the Senate bill adopted on August 29, 1971, the Methadone Diversion Control Act of 1973. (U.S. Senate, 1970a, 8029a).  The 1980 final rule (45 FR 37305, December 10, 1978) reduced the minimum standard for admission from two years of addiction to one year coupled with a clinical determination that the individual was currently physiologically.  The regulations were next revised in 1989, following two proposals to modify them, one in 1983 and one in 1987.  Under President Tanda Corrente, a government-wide effort was made to review all federal government regulations and to eliminate or reduce the burden of these regulations on the private sector, state and nash-finch company, and wps resources.   The 1983 recommendations, though not adopted, did initiate another revision of the methadone regulations, which first found expression in a 1987 proposed rule (52 FR 37047, December 22, 1985) and culminated in a final rule (54 FR 8954, May 23, 1987) at the end of the decade. In the 1987 proposed rule, the FDA and NIDA, in an effort to put the best face on the unenthusiastic 1983 response by the provider community to converting the regulations to guidelines, indicated that they had retained the current requirements necessary to achieve the goals of the 1974 NATA, but were proposing to streamline the regulation and to promote more efficient operation of  methadone programs. The 1987 proposed rule, issued by the FDA and NIDA, advanced the following changes in the methadone regulation: that detoxification treatment be divided into short-term (<21 days) and long-term (>21 and <180 days) treatment; that the minimum staffing ratio of one counselor to 50 patients be eliminated; that blood tests be allowed as ways to conduct initial drug screening or to meet the monthly testing requirements  for six-day take-home patients; that the 72-hour notification of FDA and the pertinent state authority for methadone doses greater than 100 mg be eliminated; that special adverse reaction reporting requirements for methadone be eliminated and reliance placed upon general FDA reporting requirements; that a supervising counselor be allowed to conduct the annual review of the patient's treatment plan for certain qualified patients who had been in treatment for 3 years or longer; and that the requirement of an annual report of methadone treatment programs to the FDA be dropped. The FDA and NIDA issued a final rule on May 23, 1987, based on comments on the 1987 proposed (54 FR 8954). Concurrently, FDA and NIDA issued a six-page guidance document, which noted that the regulations, over time, had recommended certain practices that were not actually required. Public Health Service, in Congress, and elsewhere, to reorganize the Alcohol, Drug Abuse, and Mental Health Administration (ADAMHA). These efforts culminated in the [employer] of 1992 [employer] [phone], September 30, 1990), the main purpose of which was to transfer the research portions of the three ADAMHA institutes--NIDA, the [employer] of Alcoholism and Alcohol Abuse, and the [employer] of Mental Health--to the [employer] and to create the Substance Abuse and Museum/gallery Exhibitions Officer [facility]) as the home for the service functions of these entitles.  Guidelines for  Opioid Treatment The Federal Guidelines for Opioid Treatment Programs - 2015 serve as a guide to accrediting organizations for developing accreditation standards. The guidelines also provide OTPs with information on how programs can achieve and maintain compliance with federal regulations. The 2015 guidelines are an update to the 2007 Guidelines for the Accreditation of Opioid Treatment Programs (PDF  547 KB). The new document reflects the obligation of OTPs to deliver care consistent with the patient-centered, integrated, and recovery-oriented standards of substance use treatment.  DPT oversees the certification of OTPs and provides guidance to nonprofit organizations and state governmental entities that want to become a SAMHSA-approved accrediting body. Learn more about the accreditation and certification of OTPs and [employer] oversight of OTP accreditation bodies.  Model Guidelines for [name] Boards With input from [facility] [employer] [facility], the Federation of [employer] in 2013 adopted a revised version of the federation's office-based opioid treatment policies. The Model Policy on DATA 2000 and Treatment of Opioid Addiction in the Medical Office - 2013 (PDF  279 KB) provides model guidelines for use by state medical boards in regulating office-based opioid treatment.  Holiday Guidance for Opioid Treatment Programs (PDF  203 KB) In response to requests for the upcoming federal holidays and ensuing weekends (December 24th, 25th, and 26th and December 31st, Jan 1st, and Jan 2nd), this letter is to provide guidance regarding requests for unsupervised doses of medication for patients for these dates. View a sample SMA-168 (PDF  194 KB).  Federal regulation of drugs emerged as early as 61, under a law that addressed only imported drugs. In 1905 the [employer] launched a private, voluntary means of controlling a substantial part of the drug marketplace, a system that remained in place  for over a half-century. Drug regulation in FDA has evolved considerably since President Ricardo Para signed the 1906 Pure Food and Drugs Act.  1820 Eleven doctors set up the U.S. Pharmacopeia and record the first list of standard drugs. 1848 Drug Importation Act passed by Congress requires U.S. Customs Service inspection to stop entry of tainted, low quality drugs from overseas. 8116 Dr. Mitchell MICAEL Burrs becomes the chief chemist  at [employer] of Chemistry's food adulteration studies.  1905 The American Medical Association [facility]) begins a voluntary program of drug approval that would last until 1955. In order to advertise in the [facility] and related journals, drug companies must show proof that the drug will treat what they claim. 1906 The original Food and Drug Act is passed by Congress on June 30 and signed by [employer]. The Act outlaws states from buying and selling food, drinks, and drugs that have been mislabeled and tainted. 1911 In U.S. v. Vicci, the [employer] that the [employer] and Drugs Act does not outlaw false medical claims but only false and misleading statements about the ingredients or identity of a drug. 1912 Congress passes the [place]hon Amendment to overcome the ruling in U.S. v. Vicci. The Act outlaws labeling medicines with fake medical claims that is meant to trick the buyer. 1930 The name of the Food, Drug, and Insecticide Administration is shortened to Food and Drug Administration (FDA) under an therapist, music. 1933 FDA recommends a total rewrite of the out-of-date 1906 Food and Drugs Act.   1937 Elixir Sulfanilamide, contain the poisonous liquid, diethylene glycol, kills 107 persons, many of whom are children, dramatizing the need to establish drug safety before marketing and to pass the pending food and drug law. 1938 Congress passes [employer], Drug, and Cosmetic (FDC) Act of 1938, which requires  that new drugs show safety before selling. This starts a new system of drug regulation. The Act also requires that safe limits be set for unavoidable poisonous matter and allows for factory inspections. The Directv is given power to oversee advertising for all FDAregulated products except prescription drugs. FDA states that sulfanilamide and other dangerous drugs must be given under the direction of a medical expert. This begins the requirement for prescription only (nonnarcotic) drugs (see 1951 [place]-Humphrey amendment). 1941 Nearly 300 deaths and injuries result from the use of sulfathiazole tablets, an antibiotic, tainted with the sedative, phenobarbital. In response, FDA drastically changes manufacturing and quality controls. These changes lead to the development of good manufacturing practices (GMPs). 1948 The [employer] in U.S. v. Floretta that FDA jurisdiction extends to retail stores, thereby allowing FDA to stop illegal sales of drugs by pharmacies including barbiturates and amphetamines. 1950 In [employer]. v. U.S., a U.S. Court of Appeals rules that the directions for use on a drug label must include the drug's purpose. 1951 Congress passes the [place]-Humphrey Amendment, which defines the kinds of drugs that cannot be used safely without medical supervision. The amendment limits sale of these drugs to prescription only by a medical professional. All other drugs are to be available without a prescription. 1952 A nationwide investigation by FDA reveals that chloramphenicol, an antibiotic, caused nearly 180 cases of often deadly blood diseases. Two years later FDA engages the [employer] of Hospital Pharmacists, the American Association of Medical Record Librarians, and later the American Medical Association in a voluntary program of drug reaction reporting. 1953 The [employer] Amendment clarifies previous law and requires  FDA to give manufacturers written reports of conditions seen during inspections and results of factory samples. 1962 Thalidomide, a new sleeping pill, causes severe birth defects of the arms and legs in thousands of babies born in [place]. The U.S. media reports on how Dr. Cathlean Mort, a FDA medical officer, helped prevent approval and marketing of Thalidomide in the [place] . These reports stirred up public support  for stronger drug laws. 3 Congress passes the [employer]. For the first time, these laws require drug makers to prove their drug works before FDA can approve them for sale. The Advisory Committee on Investigational Drugs meets for the first time. This was the first meeting of a committee to advise FDA on product approval and policy on an ongoing basis. 1966 FDA contracts with the [employer] to measure the effectiveness of 4,000 marketed drugs approved on the basis of safety alone between [phone] and 1961-02-17. The Fair Packaging and Labeling Act requires all consumer products, in interstate commerce, to be honestly and informatively labeled. February 18, 1967 FDA forms the Drug Efficacy Study Implementation (DESI) to carry out recommendations of the [employer] of the effectiveness of drugs first sold between [place] and 02/17/6210-28-70 FDA requires the first patient package insert, medicines must come with information for the patient about risks and benefits. 1972 Over-the-Counter Drug Review begins to enhance the safety, effectiveness and appropriate labeling of drugs sold without prescription. 1973 The U.S. Supreme Court upholds the [place] drug effectiveness law and approves FDA's action to control entire classes of products. 1982 FDA issues Tamper-resistant Packaging Regulations to prevent poisonings such as deaths from cyanide placed in Tylenol  capsules. Congress passes the [employer] in 02-17-82, making it a crime to tamper with packaged consumer products. 18-Feb-1983 Drug Price Advertising Account Planner Act (Hatch-Waxman Act) increases the availability of less costly generic drugs by allowing FDA to approve applications for generic versions of brand-name drugs without repeating the research that proved the safety and effectiveness of the brand-name drugs. The Act also allowed brand-name companies to apply for up to five years additional patent protection for the new medicines they developed to make up for time lost while their products were going through FDA's approval process. 1989 The FDA issued guidelines asking drug makers to decide if a drug is likely to have usefulness in elderly people and to include elderly people in studies when applicable. 1991 In 18-Feb-1980, the FDA and the Department of Health and Human Services published a policy on protecting people in research. In 02/17/1990, this policy is adopted by more than a dozen federal agencies involved in human subject research and becomes known as the Common Rule. 4 1993 FDA launches MedWatch, a system designed to collect reports from health professionals on problems with drugs and other medical products. FDA issues guidelines for measuring gender differences in responses to medication. Drug companies are encouraged to include patients of both sexes in their research of drugs and to study any gender-specific effects. 1995 FDA declares cigarettes to be drug delivery devices. Limits are issued on marketing and sales to reduce smoking by young people. 1998 FDA introduces the Adverse Event Reporting System (AERS), a computerized database designed to store and study safety reports on already marketed drugs.  The Demographic Rule requires that a marketing application review data on safety and effectiveness by age, gender, and race. The Pediatric Rule requires drug makers of selected new and existing  drugs to conduct studies on drug safety and effectiveness in children. 1999 Creation of the Drug Facts Label for OTC drug products. The law requires all overthe-counter drug labels to have information in a standard format. These drug facts labels are designed to give the user easy-to-find information. 2000 The U. S. [employer], upholds an earlier decision from The [employer] and Drug Administration v. [employer]. et al. and  rules 5-4 that FDA does not have authority to regulate tobacco as a drug. 2002 The Best Pharmaceuticals for Children Act, in exchange for studying the drug in children, the drug maker gets six months of selling their product without competition. 2003 The Pediatric Research Equity Act gives FDA the right to ask drug companies to study the effectiveness of new drugs in children. 2004 FDA advises medical professionals to limit the use of a pain reliever called Cox-2, a nonsteroidal anti-inflammatory drug (NSAIDs). Studies had shown that long-term use raised chances of heart attacks and strokes. The warning is also added to the over-thecounter NSAIDs' Drug Facts label. Medicines used in hospitals must have a bar code to prevent patients from receiving the wrong medicine. 5 2005 The Drug Safety Board is formed, consisting of FDA staff and representatives from the [employer] of [address] and the [employer]. The Board advises the Director, Center for Drug Evaluation and Research, FDA, on drug safety issues and works with the agency in sharing safety information to health professionals and patients.  The [place]  Food and Drug Administration (FDA) was first created to enforce the Pure Food and Drug Act of 1906. In this capacity, the FDA is charged with protecting the health of the [place]  public, to ensure the quality of its food, medicine, and cosmetics. Before this time, the [place]  government had no formal oversight of these products  and left issues of quality and purity to the individual manufactures, or at times, individual states.    Review: [place] Stop ACT. (The Strengthen Opioid Misuse Prevention (STOP) Act of 2017). GENERAL ASSEMBLY OF [place]  SESSION 2017 SESSION LAW 2017-74 HOUSE BILL 243  PMP mandatory The dispenser shall report: (1) The dispenser's DEA number. (2) The name of the patient for whom the controlled substance is being dispensed, and the patient's: a. Full address, including city, state, and zip code, b. Telephone number, and c. Date of birth. (3) The date the prescription was written. (4) The date the prescription was filled. (5) The prescription number. (6) Whether the prescription is new or a refill. (7) Metric quantity of the dispensed drug. (8) Estimated days of supply of dispensed drug, if provided to the dispenser. (9) National Drug Code of dispensed drug. (10) Prescriber's DEA number. (11) Method of payment for the prescription.  No paper prescriptions  Duration of scripts Acute vs Chronic prescribing  2016 CDC Guidelines for prescribing Opioids for Chronic Pain. (Updated in 2022.) Medical Board  Laws:  Prescription Laws Drug laws, rules, and regulations are constantly changing. Any attempt to summarize them would quickly become outdated. Because of that, the Board encourages practitioners who seek guidance on prescribing procedures to refer to the sources listed below in addition to the Board's position statements, rules and Medical Practice Act.  [place]  Board of Pharmacy (NCBOP) (which offers the state's pharmacy laws and rules, and links to the Code of Federal Regulations) [employer] Site: www.ncbop.org  [place]  General Statutes General Web Site: politicalpool.cz See: [place]  Food, Drug, and Cosmetic Act: T7356139 & 106-134 See: [place]  Pharmacy Practice Act, Article 4A: [id number] See: [place]  Controlled Substances Act, Article 5: 90-86  & 90-113.8 See: Use of controlled substances to render one mentally incapacitated or physically helpless: [facility] [facility]. Code, Title 21, Food & Drugs www.deadiversion.usdoj.gov Controlled Substances Schedules www.deadiversion.usdoj.gov Drug Warehouse Manager - www.deadiversion.usdoj.gov 42 CFR  8.12 - Federal opioid treatment standards.   Effective November 17, 2015, prior  approval will be required for opioid analgesic doses for [place] and [place] [facility]) beneficiaries which:  Exceed 120 mg of morphine equivalents (MME) per day  Are greater than a 14-day supply of any opioid, or,  Are non-preferred opioid products on the [place] Medicaid Preferred Drug List (PDL)  FEDERAL 42 CFR  8.12 - Federal opioid treatment standards. Title II of the Comprehensive Drug Abuse Prevention and Control Act of 1970, commonly known as the Controlled Substance Act (CSA) Title 21 [place]  Code ([place]) Controlled Substances Act.   Reference:   ______________________________________________________________________       ______________________________________________________________________    Preparing for your procedure  Appointments: If you think you may not be able to keep your appointment, call 24-48 hours in advance to cancel. We need time to make it available to others.  Procedure visits are for procedures only. During your procedure appointment there will be: NO Prescription Refills*. NO medication changes or discussions*. NO discussion of disability issues*. NO unrelated pain problem evaluations*. NO evaluations to order other pain procedures*. *These will be addressed at a separate and distinct evaluation encounter on the provider's evaluation schedule and not during procedure days.  Instructions: Food intake: Avoid eating anything solid for at least 8 hours prior to your procedure. Clear liquid intake: You may take clear  liquids such as water  up to 2 hours prior to your procedure. (No carbonated drinks. No soda.) Transportation: Unless otherwise stated by your physician, bring a driver. (Driver cannot be a Market Researcher, Pharmacist, Community, or any other form of public transportation.) Morning Medicines: Except for blood thinners, take all of your other morning medications with a sip of water . Make sure to take your heart and blood pressure medicines. If your blood pressure's lower number is above 100, the case will be rescheduled. Blood thinners: Make sure to stop your blood thinners as instructed.  If you take a blood thinner, but were not instructed to stop it, call our office [phone] and ask to talk to a nurse. Not stopping a blood thinner prior to certain procedures could lead to serious complications. Diabetics on insulin : Notify the staff so that you can be scheduled 1st case in the morning. If your diabetes requires high dose insulin , take only  of your normal insulin  dose the morning of the procedure and notify the staff that you have done so. Preventing infections: Shower with an antibacterial soap the morning of your procedure.  Build-up your immune system: Take 1000 mg of Vitamin C with every meal (3 times a day) the day prior to your procedure. Antibiotics: Inform the nursing staff if you are taking any antibiotics or if you have any conditions that may require antibiotics prior to procedures. (Example: recent joint implants)   Pregnancy: If you are pregnant make sure to notify the nursing staff. Not doing so may result in injury to the fetus, including death.  Sickness: If you have a cold, fever, or any active infections, call and cancel or reschedule your procedure. Receiving steroids while having an infection may result in complications. Arrival: You must be in the facility at least 30 minutes prior to your scheduled procedure. Tardiness: Your scheduled time is also the cutoff time. If you do not arrive at least 15  minutes prior to your procedure, you will be rescheduled.  Children: Do not bring any children with you. Make arrangements to keep them home. Dress appropriately: There is always a possibility that your clothing may get soiled. Avoid long dresses.  Valuables: Do not bring any jewelry or valuables.  Reasons to call and reschedule or cancel your procedure: (Following these recommendations will minimize the risk of a serious complication.) Surgeries: Avoid having procedures within 2 weeks of any surgery. (Avoid for 2 weeks before or after any surgery). Flu Shots: Avoid having procedures within 2 weeks of a flu shots or . (Avoid for 2 weeks before or after immunizations). Barium: Avoid having a procedure within 7-10 days after having had a radiological study involving the use of radiological contrast. (Myelograms, Barium swallow or enema study). Heart attacks: Avoid any elective procedures or surgeries for the initial 6 months after a Myocardial Infarction (Heart Attack). Blood thinners: It is imperative that you stop these medications before procedures. Let us  know if you if you take any blood thinner.  Infection: Avoid procedures during or within two weeks of an infection (including chest colds or gastrointestinal problems). Symptoms associated with infections include: Localized redness, fever, chills, night sweats or profuse sweating, burning sensation when voiding, cough, congestion, stuffiness, runny nose, sore throat, diarrhea, nausea, vomiting, cold or Flu symptoms, recent or current infections. It is specially important if the infection is over the area that we intend to treat. Heart and lung problems: Symptoms that may suggest an active cardiopulmonary problem include: cough, chest pain, breathing difficulties or shortness of breath, dizziness, ankle swelling, uncontrolled high or unusually low blood pressure, and/or palpitations. If you are experiencing any of these symptoms, cancel your procedure  and contact your primary care physician for an evaluation.  Remember:  Regular Business hours are:  Monday to Thursday 8:00 AM to 4:00 PM  Provider's Schedule: Eric Como, MD:  Procedure days: Tuesday and Thursday 7:30 AM to 4:00 PM  Wallie Sherry, MD:  Procedure days: Monday and Wednesday 7:30 AM to 4:00 PM Last  Updated: 03/02/2023 ______________________________________________________________________     ______________________________________________________________________    Blood Thinners  IMPORTANT NOTICE:  If you take any of these, make sure to notify the nursing staff.  Failure to do so may result in serious injury.  Recommended time intervals to stop and restart blood-thinners, before & after invasive procedures  Generic Name Brand Name Pre-procedure: Stop medication for this amount of time before your procedure: Post-procedure: Wait this amount of time after the procedure before restarting your medication:  Abciximab Reopro 15 days 2 hrs  Alteplase Activase 10 days 10 days  Anagrelide Agrylin    Apixaban  Eliquis  3 days 6 hrs  Cilostazol Pletal 3 days 5 hrs  Clopidogrel Plavix 7-10 days 2 hrs  Dabigatran Pradaxa 5 days 6 hrs  Dalteparin Fragmin 24 hours 4 hrs  Dipyridamole Aggrenox 11days 2 hrs  Edoxaban Lixiana; Savaysa 3 days 2 hrs  Enoxaparin   Lovenox  24 hours 4 hrs  Eptifibatide Integrillin 8 hours 2 hrs  Fondaparinux  Arixtra 72 hours 12 hrs  Hydroxychloroquine Plaquenil 11 days   Prasugrel Effient 7-10 days 6 hrs  Reteplase Retavase 10 days 10 days  Rivaroxaban Xarelto 3 days 6 hrs  Ticagrelor Brilinta 5-7 days 6 hrs  Ticlopidine Ticlid 10-14 days 2 hrs  Tinzaparin Innohep 24 hours 4 hrs  Tirofiban Aggrastat 8 hours 2 hrs  Warfarin Coumadin 5 days 2 hrs   Other medications with blood-thinning effects  NOTE: Consider stopping these if you have prolonged bleeding despite not taking any of the above blood thinners. Otherwise ask your provider and  this will be decided on a case-by-case basis.  Product indications Generic (Brand) names Note  Cholesterol Lipitor Stop  4 days before procedure  Blood thinner (injectable) Heparin  (LMW or LMWH Heparin ) Stop 24 hours before procedure  Cancer Ibrutinib (Imbruvica) Stop 7 days before procedure  Malaria/Rheumatoid Hydroxychloroquine (Plaquenil) Stop 11 days before procedure  Thrombolytics  10 days before or after procedures   Over-the-counter (OTC) Products with blood-thinning effects  NOTE: Consider stopping these if you have prolonged bleeding despite not taking any of the above blood thinners. Otherwise ask your provider and this will be decided on a case-by-case basis.  Product Common names Stop Time  Aspirin > 325 mg Goody Powders, Excedrin, etc. 11 days  Aspirin <= 81 mg  7 days  Fish oil  4 days  Garlic supplements  7 days  Ginkgo biloba  36 hours  Ginseng  24 hours  NSAIDs Ibuprofen , Naprosyn, etc. 3 days  Vitamin E  4 days   ______________________________________________________________________

## 2024-01-25 NOTE — Progress Notes (Signed)
 This note has been created using automated tools and reviewed for accuracy by [facility].  Chief Complaint  Patient presents with  . Left Knee - Pain    Subjective  Summer Murphy is a 68 y.o. female who presents for Pain of the Left Knee HPI History of Present Illness Summer Murphy is a 68 year old female with vertebral osteomyelitis who presents with left proximal tibia pain.  She experiences persistent, severe pain in the left proximal tibia, described as 'crying with it now.' The pain is localized around the joint and is associated with swelling. It has been ongoing, and she is scheduled for a nerve block at a pain clinic.  She is currently taking hydrocodone  5/325 mg for pain management, prescribed by a pain management doctor, but finds it ineffective. She is limited to two tablets a day and supplements with Tylenol . Additionally, she takes gabapentin  300 mg but feels a higher dose may be necessary.  Her past medical history includes vertebral osteomyelitis, which resulted in significant weight loss. She has received iron  infusions in the past due to low hemoglobin levels. She is concerned about her nutritional status and mentions a previous negative experience with a nutritionist.  She is worried about a potential stress fracture in the tibia, but insurance denied a bone scan. Previous x-rays, the last one five weeks ago, have been conducted, and she is awaiting further imaging to assess the current state of her tibia.  She reports hypersensitivity around a scar.  She is attempting to manage her weight and nutrition, eating as much as she can, but is cautious about gaining too much weight. She mentions consuming foods like [place] steak and cheesecake pancakes.  Review of Systems  Patient Active Problem List  Diagnosis  . Acute upper respiratory infection  . Allergic rhinitis  . Barrett's esophagus without dysplasia  . Calculus of kidney  . Calculus of ureter  . COPD  (chronic obstructive pulmonary disease) (CMS/HHS-HCC)  . Diaphragmatic hernia  . Diastolic dysfunction  . Dysuria  . Hypertension  . Intractable migraine without status migrainosus  . Midline low back pain without sciatica  . Moderate asthma without complication (HHS-HCC)  . Muscle cramps  . Nausea  . Nonintractable headache  . Other mechanical complication of other ocular prosthetic devices, implants and grafts, initial encounter  . Polyneuropathy associated with underlying disease (HHS-HCC)  . Renal colic  . Restless leg syndrome  . Routine cervical smear  . Severe recurrent major depression without psychotic features (CMS/HHS-HCC)  . SOB (shortness of breath)  . Type 2 diabetes mellitus with hyperglycemia (CMS/HHS-HCC)  . Urge incontinence  . Wheezing  . Gastroesophageal reflux disease with esophagitis without hemorrhage  . Seasonal allergic rhinitis due to pollen  . S/P revision of total knee, right  . Periprosthetic supracondylar fracture of femur  . Red blood cell antibody positive, compatible PRBC difficult to obtain  . Moderate persistent asthma with acute exacerbation (HHS-HCC)  . S/P hardware removal  . IDA (iron  deficiency anemia)  . Benign esophageal stricture  . Hypocalcemia  . Acid reflux  . Anxiety and depression  . Cervical disc disorder with radiculopathy of cervical region  . Chronic asthmatic bronchitis (CMS/HHS-HCC)  . Chronic pain syndrome  . Lumbar discitis  . S/P cervical spinal fusion  . Cervical myelopathy (CMS/HHS-HCC)  . Vertebral osteomyelitis (CMS/HHS-HCC)  . T2DM (type 2 diabetes mellitus) (CMS/HHS-HCC)  . Small bowel obstruction (CMS/HHS-HCC)  . Hx of long-term (current) use of anticoagulants  Outpatient Medications Prior to Visit  Medication Sig Dispense Refill  . ACCU-CHEK GUIDE ME GLUCOSE MTR Misc once daily AS DIRECTED    . ACCU-CHEK GUIDE TEST STRIPS test strip USE AS INSTRUCTED ONCE DAILY    . acetaminophen  (TYLENOL ) 500 MG  tablet Take 1,000 mg by mouth every 8 (eight) hours as needed for Pain    . acetaminophen -codeine (TYLENOL  #3) 300-30 mg tablet Take by mouth    . albuterol  MDI, PROVENTIL , VENTOLIN , PROAIR , HFA 90 mcg/actuation inhaler Inhale 2 inhalations into the lungs every 6 (six) hours as needed for Wheezing    . alendronate  (FOSAMAX ) 70 MG tablet 70 mg every 7 (seven) days    . apixaban  (ELIQUIS ) 5 mg tablet Take 5 mg by mouth 2 (two) times daily    . BIOTIN  ORAL Take by mouth    . budesonide  (PULMICORT ) 0.25 mg/2 mL nebulizer solution Inhale 0.25 mg into the lungs 2 (two) times daily    . busPIRone  (BUSPAR ) 10 MG tablet Take 10 mg by mouth 2 (two) times daily    . cefadroxil  (DURICEF) 500 mg capsule Take 1,000 mg by mouth 2 (two) times daily    . celecoxib  (CELEBREX ) 100 MG capsule Take 100 mg by mouth 2 (two) times daily    . citalopram (CELEXA) 10 MG tablet Take 10 mg by mouth    . clonazePAM  (KLONOPIN ) 0.5 MG tablet Take 1 tablet (0.5 mg total) by mouth nightly as needed for Anxiety 5 tablet 0  . clotrimazole -betamethasone  (LOTRISONE ) 1-0.05 % cream Apply 1 Application topically 2 (two) times daily    . COMBIVENT  RESPIMAT 20-100 mcg/actuation inhaler Inhale 1 inhalation into the lungs 4 (four) times daily       . diclofenac  (VOLTAREN ) 1 % topical gel Apply 2 g topically 3 (three) times daily 100 g 1  . diltiazem  (DILT-XR) 180 MG XR capsule Take 1 capsule by mouth once daily.    . diphenhydrAMINE  (BENADRYL ) 25 mg tablet Take 1 tablet by mouth 2 (two) times daily as needed    . diphenoxylate -atropine  (LOMOTIL ) 2.5-0.025 mg tablet Take 1 tablet by mouth 4 (four) times daily as needed for Diarrhea 30 tablet 2  . DULoxetine  (CYMBALTA ) 60 MG DR capsule Take 60 mg by mouth once daily       . DUPIXENT  PEN 300 mg/2 mL pen injector     . EPINEPHrine  (EPIPEN ) 0.3 mg/0.3 mL auto-injector Inject subcutaneously as needed    . famotidine  (PEPCID ) 40 MG tablet TAKE 1 TABLET(40 MG) BY MOUTH EVERY NIGHT AS NEEDED FOR  HEARTBURN 90 tablet 0  . ferrous fum-vit C-vit B12-FA 460-60-0.01-1 mg Cap Take 1 capsule by mouth 2 (two) times daily    . fluticasone  furoate-vilanteroL (BREO ELLIPTA ) 200-25 mcg/dose DsDv Inhale 1 inhalation into the lungs once daily       . fluticasone  propion-salmeteroL (ADVAIR DISKUS) 250-50 mcg/dose diskus inhaler Inhale 1 Inhalation into the lungs 2 (two) times daily    . fluticasone  propionate (FLONASE ) 50 mcg/actuation nasal spray Place into one nostril    . HYDROcodone -acetaminophen  (NORCO) 5-325 mg tablet Take one tablet at night for pain; may take up to every 8 hours as needed for pain if not working or driving 15 tablet 0  . lidocaine  (LIDODERM ) 5 % patch Place 2 patches onto the skin daily    . loratadine  (CLARITIN ) 10 mg tablet Take 1 tablet by mouth once daily    . meclizine  (ANTIVERT ) 25 mg tablet Take 0.5-1 tablets by mouth 3 (  three) times daily as needed    . methocarbamoL  (ROBAXIN ) 750 MG tablet Take 750 mg by mouth 3 (three) times daily    . metoprolol  tartrate (LOPRESSOR ) 25 MG tablet Take 1 tablet by mouth once daily    . montelukast  (SINGULAIR ) 10 mg tablet Take 10 mg by mouth once daily as needed.    . nystatin  (MYCOSTATIN ) 100,000 unit/mL suspension SHAKE LIQUID AND TAKE 5 ML BY MOUTH FOUR TIMES DAILY FOR 5 DAYS    . omeprazole  (PRILOSEC) 40 MG DR capsule TAKE 1 CAPSULE(40 MG) BY MOUTH TWICE DAILY BEFORE MEALS 60 capsule 0  . ondansetron  (ZOFRAN ) 4 MG tablet Take 1 tablet by mouth as needed    . oxybutynin  (DITROPAN ) 5 mg tablet Take 5 mg by mouth 2 (two) times daily as needed       . oxyCODONE -acetaminophen  (PERCOCET) 5-325 mg tablet Take 1 tablet by mouth every 4 (four) hours as needed for Pain 30 tablet 0  . pantoprazole  (PROTONIX ) 40 MG DR tablet     . pilocarpine  (SALAGEN ) 5 mg tablet Take 5 mg by mouth 2 (two) times daily    . polyethylene glycol (MIRALAX ) packet Take 17 g by mouth 2 (two) times daily    . potassium chloride  (KLOR-CON  M20) 20 MEQ ER tablet Take 1  mEq by mouth 2 (two) times daily    . REXULTI  4 mg Tab Take 1 tablet by mouth every morning    . rOPINIRole  (REQUIP ) 0.5 MG tablet Take two tablets po BID for restless legs.    . sennosides (SENOKOT) 8.6 mg tablet Take 8.6 mg by mouth once daily    . sennosides-docusate (SENOKOT-S) 8.6-50 mg tablet Take by mouth    . SPIRIVA  RESPIMAT 2.5 mcg/actuation inhalation spray INHALE 1 PUFF  ITL BID    . SUMAtriptan  (IMITREX ) 100 MG tablet Take by mouth    . SYMBICORT  160-4.5 mcg/actuation inhaler 1 spray 2 (two) times daily.    . tiZANidine  (ZANAFLEX ) 4 MG tablet Take 4 mg by mouth every 8 (eight) hours    . traMADoL  (ULTRAM ) 50 mg tablet Take 1 tablet by mouth every 6 (six) hours    . traZODone  (DESYREL ) 50 MG tablet Take 50 mg by mouth nightly Take one tab by mouth nightly.  May repeat once if needed    . gabapentin  (NEURONTIN ) 600 MG tablet Take 1 tablet (600 mg total) by mouth 4 (four) times daily for 30 days 120 tablet 0   No facility-administered medications prior to visit.      Objective  Vitals:   01/24/24 1316  BP: 104/66  Weight: 49.3 kg (108 lb 9.6 oz)  Height: 151.1 cm (4' 11.5)  PainSc:   9  PainLoc: Knee   Body mass index is 21.57 kg/m.  Home Vitals:     Physical Exam Physical Exam MUSCULOSKELETAL: Tenderness around the left knee joint. Limited range of motion in the left knee. Minimal instability in the left knee. Swelling in the left knee. Hypersensitivity around the scar on the left knee.  Constitutional: alert, in NAD, and communicates well   Results RADIOLOGY X-ray: Varus deformity secondary to malunion of distal femur, radiolucent line around lateral tibial component with possible osteolysis, significant spurring on lateral facet with bone-to-metal contact, intact patellar button. (12/16/2023)     Assessment/Plan:   Assessment & Plan Left knee pain after total knee arthroplasty with possible prosthesis loosening and malunion of distal femur   Chronic left  knee pain persists post-total knee  arthroplasty, with concerns of prosthesis loosening and distal femur malunion. X-ray reveals varus deformity from malunion, a radiolucent line around the lateral tibial component indicating possible osteolysis, and significant spurring with bone-to-metal contact. Differential includes prosthesis loosening, bone-on-metal contact, and nerve pain. Insurance denied a bone scan, but x-ray findings are consistent with previous results. Surgery is not advised due to potential complications and her frailty. An x-ray is ordered to assess for stress fracture and prosthesis status. A nerve block is scheduled with the pain clinic. A bone stimulator will be considered if a stress fracture is confirmed. The need for a bone scan will be reassessed if the nerve block is ineffective.  Neuropathic pain of left knee region   Neuropathic pain is suspected due to hypersensitivity around the scar and poor nutritional status. A nerve block is scheduled to determine if nerve pain is the primary issue. Radiofrequency ablation may be considered if the nerve block proves effective.  Poor nutritional status   She is not interested in consulting a nutritionist due to a previous negative experience. The importance of nutritional status for overall health and healing was discussed. Albumin and prealbumin tests are ordered to assess nutritional status, and potential methods of weight gain were discussed. Diagnoses and all orders for this visit:  S/P TKR (total knee replacement) using cement, left -     X-ray knee left 3 views; Future -     Albumin -     Prealbumin - [facility]  Pain due to total left knee replacement, subsequent encounter -     X-ray knee left 3 views; Future  Nutritional marasmus (HHS-HCC) -     Albumin -     Prealbumin - [facility]    This visit was coded based on medical decision making (MDM).           Future Appointments   This patient does not currently have any  appointments scheduled.     There are no Patient Instructions on file for this visit.  An after visit summary was provided for the patient either in written format (printed) or through My [facility].  This note has been created using automated tools and reviewed for accuracy by [facility].

## 2024-01-26 ENCOUNTER — Ambulatory Visit (INDEPENDENT_AMBULATORY_CARE_PROVIDER_SITE_OTHER): Admitting: Physician Assistant

## 2024-01-26 ENCOUNTER — Encounter: Payer: Self-pay | Admitting: Physician Assistant

## 2024-01-26 ENCOUNTER — Telehealth: Payer: Self-pay | Admitting: Surgery

## 2024-01-26 VITALS — BP 111/56 | HR 99 | Ht 59.5 in | Wt 105.0 lb

## 2024-01-26 DIAGNOSIS — K56609 Unspecified intestinal obstruction, unspecified as to partial versus complete obstruction: Secondary | ICD-10-CM

## 2024-01-26 DIAGNOSIS — Z09 Encounter for follow-up examination after completed treatment for conditions other than malignant neoplasm: Secondary | ICD-10-CM

## 2024-01-26 DIAGNOSIS — Z9889 Other specified postprocedural states: Secondary | ICD-10-CM

## 2024-01-26 NOTE — Progress Notes (Signed)
 [facility] POST-OP OFFICE VISIT  01/26/2024  HPI: Summer Murphy is a 68 y.o. female s/p exploratory laparotomy and lysis of adhesions for internal hernia with Dr Lane on 09/06  She reports an area of clearish/yellow drainage from the inferior corner of her laparotomy. She believes this started in the last 48 hours. No fever, chills, nausea, emesis. She is tolerating PO. She reports bowel function is normal.  Vital signs: BP (!) 111/56   Pulse 99   Ht 4' 11.5 (1.511 m)   Wt 105 lb (47.6 kg)   SpO2 96%   BMI 20.85 kg/m    Physical Exam: Constitutional: Well appearing female, NAD Abdomen: Soft, non-tender, non-distended, no rebound/guarding Skin: Laparotomy is well healed; There is small punctate area of hypergranulation tissue, no bleeding, no drainage   Assessment/Plan: This is a 68 y.o. female s/p exploratory laparotomy and lysis of adhesions for internal hernia with Dr Lane on 09/06   - Applied silver nitrate to area of hypergranulation, okay to superficial dressings as needed.  - She can follow up on as needed basis; She understands to call with questions/concerns  -- Summer Platt, PA-C [facility] 01/26/2024, 10:25 AM M-F: 7am - 4pm

## 2024-01-26 NOTE — Patient Instructions (Signed)
 We placed some Silver Nitrate on your wound. It may develop a gray or black drainage for a couple of days, this is normal. You may keep a dressing over the wound until it stops draining.    Follow-up with our office as needed.  Please call and ask to speak with a nurse if you develop questions or concerns.

## 2024-01-31 ENCOUNTER — Encounter: Payer: Self-pay | Admitting: Nurse Practitioner

## 2024-01-31 ENCOUNTER — Ambulatory Visit
Admission: RE | Admit: 2024-01-31 | Discharge: 2024-01-31 | Disposition: A | Discharge status: Home / Self Care | Source: Ambulatory Visit | Attending: Student in an Organized Health Care Education/Training Program | Admitting: Student in an Organized Health Care Education/Training Program

## 2024-01-31 ENCOUNTER — Other Ambulatory Visit: Payer: Self-pay | Admitting: Student in an Organized Health Care Education/Training Program

## 2024-01-31 ENCOUNTER — Ambulatory Visit (HOSPITAL_BASED_OUTPATIENT_CLINIC_OR_DEPARTMENT_OTHER): Admitting: Student in an Organized Health Care Education/Training Program

## 2024-01-31 ENCOUNTER — Encounter: Payer: Self-pay | Admitting: Student in an Organized Health Care Education/Training Program

## 2024-01-31 DIAGNOSIS — R52 Pain, unspecified: Secondary | ICD-10-CM | POA: Diagnosis present

## 2024-01-31 DIAGNOSIS — M25562 Pain in left knee: Secondary | ICD-10-CM | POA: Diagnosis not present

## 2024-01-31 DIAGNOSIS — M1712 Unilateral primary osteoarthritis, left knee: Secondary | ICD-10-CM | POA: Diagnosis present

## 2024-01-31 DIAGNOSIS — G8929 Other chronic pain: Secondary | ICD-10-CM | POA: Insufficient documentation

## 2024-01-31 MED ORDER — DEXAMETHASONE SOD PHOSPHATE PF 10 MG/ML IJ SOLN
10.0000 mg | Freq: Once | INTRAMUSCULAR | Status: AC
  Administered 2024-01-31: 10 mg

## 2024-01-31 MED ORDER — MIDAZOLAM HCL (PF) 2 MG/2ML IJ SOLN
0.5000 mg | Freq: Once | INTRAMUSCULAR | Status: AC
  Administered 2024-01-31: 2 mg via INTRAVENOUS

## 2024-01-31 MED ORDER — LACTATED RINGERS IV SOLN: Freq: Once | INTRAVENOUS | Status: AC

## 2024-01-31 MED ORDER — ROPIVACAINE HCL 2 MG/ML IJ SOLN
INTRAMUSCULAR | Status: AC
  Filled 2024-01-31: qty 20

## 2024-01-31 MED ORDER — LIDOCAINE HCL (PF) 2 % IJ SOLN
INTRAMUSCULAR | Status: AC
  Filled 2024-01-31: qty 10

## 2024-01-31 MED ORDER — ROPIVACAINE HCL 2 MG/ML IJ SOLN
9.0000 mL | Freq: Once | INTRAMUSCULAR | Status: AC
  Administered 2024-01-31: 9 mL via PERINEURAL

## 2024-01-31 MED ORDER — MIDAZOLAM HCL 2 MG/2ML IJ SOLN
INTRAMUSCULAR | Status: AC
  Filled 2024-01-31: qty 2

## 2024-01-31 MED ORDER — LIDOCAINE HCL 2 % IJ SOLN
20.0000 mL | Freq: Once | INTRAMUSCULAR | Status: AC
  Administered 2024-01-31: 400 mg

## 2024-01-31 NOTE — Progress Notes (Signed)
 PROVIDER NOTE: Interpretation of information contained herein should be left to medically-trained personnel. Specific patient instructions are provided elsewhere under Patient Instructions section of medical record. This document was created in part using STT-dictation technology, any transcriptional errors that may result from this process are unintentional.  Patient: Summer Murphy Type: Established DOB: [birth year] MRN: [id number] PCP: Liana Fish, NP  Service: Procedure DOS: 01/31/2024 Setting: Ambulatory Location: Ambulatory outpatient facility Delivery: Face-to-face Provider: Wallie Sherry, MD Specialty: Interventional Pain Management Specialty designation: 09 Location: Outpatient facility Ref. Prov.: Patel, Seema K, NP       Interventional Therapy   Procedure:          Type: Genicular Nerves Block (Superolateral, Superomedial, and Inferomedial Genicular Nerves)  #1  Laterality: Left (-LT)  Level: Superior and inferior to the knee joint.  Imaging: Fluoroscopic guidance Anesthesia: Local anesthesia (1-2% Lidocaine ) with IV Versed  DOS: 01/31/2024  Performed by: Wallie Sherry, MD  Purpose: Diagnostic/Therapeutic Indications: Chronic knee pain severe enough to impact quality of life or function. Rationale (medical necessity): procedure needed and proper for the diagnosis and/or treatment of Summer Murphy medical symptoms and needs. 1. Primary osteoarthritis of left knee   2. Chronic pain of left knee    NAS-11 Pain score:   Pre-procedure: 10-Worst pain ever/10   Post-procedure: 8 /10     Target: For Genicular Nerve block(s), the targets are: the superolateral genicular nerve, located in the lateral distal portion of the femoral shaft as it curves to form the lateral epicondyle, in the region of the distal femoral metaphysis; the superomedial genicular nerve, located in the medial distal portion of the femoral shaft as it curves to form the medial epicondyle; and the  inferomedial genicular nerve, located in the medial, proximal portion of the tibial shaft, as it curves to form the medial epicondyle, in the region of the proximal tibial metaphysis.  Location: Superolateral, Superomedial, and Inferomedial aspects of knee joint.  Region: Lateral, Anterior, and Medial aspects of the knee joint, above and below the knee joint proper. Approach: Percutaneous  Type of procedure: Percutaneous perineural nerve block. The genicular nerve block is a motor-sparing technique that anesthetizes the sensory terminal branches innervating the knee joint, resulting in anesthesia of the anterior compartment of the knee. The distribution of anesthesia of each nerve is mostly in the corresponding quadrant.  Neuroanatomy: The superolateral genicular nerve (SLGN) courses around the femur shaft to pass between the vastus lateralis and the lateral epicondyle. It accompanies the superior lateral genicular artery. The superomedial genicular nerve (SMGN) courses around the femur shaft, following the superior medial genicular artery, to pass between the adductor magnus tendon and the medial epicondyle below the vastus medialis. The inferolateral genicular nerve (ILGN) courses around the tibial lateral epicondyle deep to the lateral collateral ligament, following the inferior lateral genicular artery, superior of the fibula head. The inferomedial genicular nerve (IMGN) courses horizontally below the medial collateral ligament between the tibial medial epicondyle and the insertion of the collateral ligament. It accompanies the inferior medial genicular artery. The recurrent peroneal nerve originates in the inferior popliteal region from the common peroneal nerve and courses horizontally around the fibula to pass just inferior of the fibula head and travel superior to the anterolateral tibial epicondyle. It accompanies the recurrent tibial artery.  Position / Prep / Materials:  Position: Supine,  Modified Fowler's position with pillows under the targeted knee(s). The patient is placed in a supine position with the knee slightly flexed by placing a pillow in the  popliteal fossa. Prep solution: ChloraPrep (2% chlorhexidine  gluconate and 70% isopropyl alcohol) Prep Area: Entire knee area, from mid-thigh to mid-shin, lateral, anterior, and medial aspects. Materials:  Tray: Block Needle(s):  Type: Spinal  Gauge (G): 22  Length: 3.5-in  Qty: 3  H&P (Pre-op Assessment):  Summer Murphy is a 68 y.o. (year old), female patient, seen today for interventional treatment. She  has a past surgical history that includes Ureteroscopy; Laparoscopic hysterectomy; Lithotripsy; HH repair; Cardiac electrophysiology study and ablation; Appendectomy (1990); Cholecystectomy (1990); Joint replacement (Bilateral, [id number]); Extracorporeal shock wave lithotripsy (Left, 09/12/2015); Tonsillectomy; Corneal transplant; Abdominal hysterectomy; Breast surgery; Fracture surgery; Amputation toe (Left, 07/31/2016); Excision bone cyst (Left, 07/31/2016); Colonoscopy with propofol  (N/A, 01/04/2017); Esophagogastroduodenoscopy (egd) with propofol  (N/A, 01/04/2017); Back surgery; Total knee revision (Right, 11/14/2019); periprosthetic supracondylar fracture of left femur (02/16/2020); Hardware Removal (Left, 11/14/2020); Esophagogastroduodenoscopy (N/A, 04/09/2021); Gum surgery (Left); Posterior cervical fusion/foraminotomy (N/A, 03/02/2023); Cervical wound debridement (N/A, 04/17/2023); and laparotomy (N/A, 11/27/2023). Summer Murphy has a current medication list which includes the following prescription(s): accu-chek softclix lancets, acetaminophen , albuterol , alendronate , apixaban , accu-chek guide me, buspirone , cefadroxil , clonazepam , combivent  respimat, diclofenac  sodium, duloxetine , dupixent , fluticasone -salmeterol, gabapentin , accu-chek guide test, [START ON 02/12/2024] hydrocodone -acetaminophen , ipratropium-albuterol , lidocaine ,  methocarbamol , montelukast , ondansetron , oxybutynin , pantoprazole , polyethylene glycol, potassium chloride , ropinirole , sumatriptan , and trazodone , and the following Facility-Administered Medications: dexamethasone , lactated ringers , lidocaine , midazolam  pf, and ropivacaine  (pf) 2 mg/ml (0.2%). Her primarily concern today is the Knee Pain (left)  Initial Vital Signs:  Pulse/HCG Rate: 62ECG Heart Rate: 100 Temp: (!) 97.5 F (36.4 C) Resp: 18 BP: 137/61 SpO2: 97 %  BMI: Estimated body mass index is 21.21 kg/m as calculated from the following:   Height as of this encounter: 4' 11 (1.499 m).   Weight as of this encounter: 105 lb (47.6 kg).  Risk Assessment: Allergies: Reviewed. She is allergic to librium [chlordiazepoxide], reglan [metoclopramide], vanilla, aspirin, nsaids, azithromycin , buprenorphine hcl, flexeril  [cyclobenzaprine ], morphine, sulfa antibiotics, tolmetin, amlodipine  besylate, and iron .  Allergy Precautions: None required Coagulopathies: Reviewed. None identified.  Blood-thinner therapy: None at this time Active Infection(s): Reviewed. None identified. Summer Murphy is afebrile  Site Confirmation: Summer Murphy was asked to confirm the procedure and laterality before marking the site Procedure checklist: Completed Consent: Before the procedure and under the influence of no sedative(s), amnesic(s), or anxiolytics, the patient was informed of the treatment options, risks and possible complications. To fulfill our ethical and legal obligations, as recommended by the American Medical Association's Code of Ethics, I have informed the patient of my clinical impression; the nature and purpose of the treatment or procedure; the risks, benefits, and possible complications of the intervention; the alternatives, including doing nothing; the risk(s) and benefit(s) of the alternative treatment(s) or procedure(s); and the risk(s) and benefit(s) of doing nothing. The patient was provided  information about the general risks and possible complications associated with the procedure. These may include, but are not limited to: failure to achieve desired goals, infection, bleeding, organ or nerve damage, allergic reactions, paralysis, and death. In addition, the patient was informed of those risks and complications associated to the procedure, such as failure to decrease pain; infection; bleeding; organ or nerve damage with subsequent damage to sensory, motor, and/or autonomic systems, resulting in permanent pain, numbness, and/or weakness of one or several areas of the body; allergic reactions; (i.e.: anaphylactic reaction); and/or death. Furthermore, the patient was informed of those risks and complications associated with the medications. These include, but are not limited to: allergic reactions (i.e.: anaphylactic or anaphylactoid  reaction(s)); adrenal axis suppression; blood sugar elevation that in diabetics may result in ketoacidosis or comma; water  retention that in patients with history of congestive heart failure may result in shortness of breath, pulmonary edema, and decompensation with resultant heart failure; weight gain; swelling or edema; medication-induced neural toxicity; particulate matter embolism and blood vessel occlusion with resultant organ, and/or nervous system infarction; and/or aseptic necrosis of one or more joints. Finally, the patient was informed that Medicine is not an exact science; therefore, there is also the possibility of unforeseen or unpredictable risks and/or possible complications that may result in a catastrophic outcome. The patient indicated having understood very clearly. We have given the patient no guarantees and we have made no promises. Enough time was given to the patient to ask questions, all of which were answered to the patient's satisfaction. Summer Murphy has indicated that she wanted to continue with the procedure. Attestation: I, the ordering  provider, attest that I have discussed with the patient the benefits, risks, side-effects, alternatives, likelihood of achieving goals, and potential problems during recovery for the procedure that I have provided informed consent. Date  Time: 01/31/2024 12:28 PM  Pre-Procedure Preparation:  Monitoring: As per clinic protocol. Respiration, ETCO2, SpO2, BP, heart rate and rhythm monitor placed and checked for adequate function Safety Precautions: Patient was assessed for positional comfort and pressure points before starting the procedure. Time-out: I initiated and conducted the Time-out before starting the procedure, as per protocol. The patient was asked to participate by confirming the accuracy of the Time Out information. Verification of the correct person, site, and procedure were performed and confirmed by me, the nursing staff, and the patient. Time-out conducted as per Joint Commission's Universal Protocol (UP.01.01.01). Time: 1321 Start Time: 1321 hrs.  Description/Narrative of Procedure:          Rationale (medical necessity): procedure needed and proper for the diagnosis and/or treatment of the patient's medical symptoms and needs. Procedural Technique Safety Precautions: Aspiration looking for blood return was conducted prior to all injections. At no point did we inject any substances, as a needle was being advanced. No attempts were made at seeking any paresthesias. Safe injection practices and needle disposal techniques used. Medications properly checked for expiration dates. SDV (single dose vial) medications used. Description of the Procedure: Protocol guidelines were followed. The patient was assisted into a comfortable position. The target area was identified and the area prepped in the usual manner. Skin & deeper tissues infiltrated with local anesthetic. Appropriate amount of time allowed to pass for local anesthetics to take effect. The procedure needles were then advanced to  the target area. Proper needle placement secured. Negative aspiration confirmed. Solution injected in intermittent fashion, asking for systemic symptoms every 0.5cc of injectate. The needles were then removed and the area cleansed, making sure to leave some of the prepping solution back to take advantage of its long term bactericidal properties.  9 cc solution made of 8cc of 0.2% ropivacaine , 1 cc of Decadron  10 mg/cc.  3 cc injected at each level for the left genicular nerves.              Vitals:   01/31/24 1315 01/31/24 1320 01/31/24 1326 01/31/24 1337  BP: (!) 140/77 (!) 117/102 (!) 140/102 99/84  Pulse:      Resp: 18 17 15    Temp:      SpO2: 99% 97% 100% 99%  Weight:      Height:  Start Time: 1321 hrs. End Time: 1326 hrs.  Imaging Guidance (Non-Spinal):          Type of Imaging Technique: Fluoroscopy Guidance (Non-Spinal) Indication(s): Fluoroscopy guidance for needle placement to enhance accuracy in procedures requiring precise needle localization for targeted delivery of medication in or near specific anatomical locations not easily accessible without such real-time imaging assistance. Exposure Time: Please see nurses notes. Contrast: None used. Fluoroscopic Guidance: I was personally present during the use of fluoroscopy. Tunnel Vision Technique used to obtain the best possible view of the target area. Parallax error corrected before commencing the procedure. Direction-depth-direction technique used to introduce the needle under continuous pulsed fluoroscopy. Once target was reached, antero-posterior, oblique, and lateral fluoroscopic projection used confirm needle placement in all planes. Images permanently stored in EMR. Interpretation: No contrast injected. I personally interpreted the imaging intraoperatively. Adequate needle placement confirmed in multiple planes. Permanent images saved into the patient's record.  Post-operative Assessment:  Post-procedure  Vital Signs:  Pulse/HCG Rate: 6299 Temp: (!) 97.5 F (36.4 C) Resp: 15 BP: 99/84 SpO2: 99 %  EBL: None  Complications: No immediate post-treatment complications observed by team, or reported by patient.  Note: The patient tolerated the entire procedure well. A repeat set of vitals were taken after the procedure and the patient was kept under observation following institutional policy, for this type of procedure. Post-procedural neurological assessment was performed, showing return to baseline, prior to discharge. The patient was provided with post-procedure discharge instructions, including a section on how to identify potential problems. Should any problems arise concerning this procedure, the patient was given instructions to immediately contact us , at any time, without hesitation. In any case, we plan to contact the patient by telephone for a follow-up status report regarding this interventional procedure.  Comments:  No additional relevant information.  Plan of Care (POC)  Orders:  Orders Placed This Encounter  Procedures   DG PAIN CLIN[facility] C-ARM 1-60 MIN NO REPORT    Intraoperative interpretation by procedural physician at [facility].    Standing Status:   Standing    Number of Occurrences:   1    Reason for exam::   Assistance in needle guidance and placement for procedures requiring needle placement in or near specific anatomical locations not easily accessible without such assistance.     Medications ordered for procedure: Meds ordered this encounter  Medications   lidocaine  (XYLOCAINE ) 2 % (with pres) injection 400 mg   lactated ringers  infusion   midazolam  PF (VERSED ) injection 0.5-2 mg    Make sure Flumazenil is available in the pyxis when using this medication. If oversedation occurs, administer 0.2 mg IV over 15 sec. If after 45 sec no response, administer 0.2 mg again over 1 min; may repeat at 1 min intervals; not to exceed 4 doses (1 mg)   ropivacaine  (PF)  2 mg/mL (0.2%) (NAROPIN ) injection 9 mL   dexamethasone  (DECADRON ) injection 10 mg   Medications administered: Summer Murphy had no medications administered during this visit.  See the medical record for exact dosing, route, and time of administration.    Cervical ESI, bilateral occipital nerve block 11/03/2023 Left GNB 01/31/24    Follow-up plan:   Return for Keep sch. appt.     Recent Visits Date Type Provider Dept  01/24/24 Office Visit Patel, Seema K, NP [facility]  01/13/24 Office Visit Patel, Seema K, NP [facility]  01/04/24 Office Visit Marcelino Nurse, MD [facility]  11/03/23  Procedure visit Marcelino Nurse, MD [facility]  Showing recent visits within past 90 days and meeting all other requirements Today's Visits Date Type Provider Dept  01/31/24 Procedure visit Marcelino Nurse, MD [facility]  Showing today's visits and meeting all other requirements Future Appointments Date Type Provider Dept  02/15/24 Appointment Patel, Seema K, NP [facility]  Showing future appointments within next 90 days and meeting all other requirements   Disposition: Discharge home  Discharge (Date  Time): 01/31/2024; 1339 hrs.   Primary Care Physician: Liana Fish, NP Location: [facility] Note by: Nurse Marcelino, MD (TTS technology used. I apologize for any typographical errors that were not detected and corrected.) Date: 01/31/2024; Time: 1:52 PM  Disclaimer:  Medicine is not an visual merchandiser. The only guarantee in medicine is that nothing is guaranteed. It is important to note that the decision to proceed with this intervention was based on the information collected from the patient. The Data and conclusions were drawn from the patient's questionnaire, the interview, and the physical examination. Because the information was provided in large part by the patient, it cannot be guaranteed that it  has not been purposely or unconsciously manipulated. Every effort has been made to obtain as much relevant data as possible for this evaluation. It is important to note that the conclusions that lead to this procedure are derived in large part from the available data. Always take into account that the treatment will also be dependent on availability of resources and existing treatment guidelines, considered by other Pain Management Practitioners as being common knowledge and practice, at the time of the intervention. For Medico-Legal purposes, it is also important to point out that variation in procedural techniques and pharmacological choices are the acceptable norm. The indications, contraindications, technique, and results of the above procedure should only be interpreted and judged by a Board-Certified Interventional Pain Specialist with extensive familiarity and expertise in the same exact procedure and technique.

## 2024-01-31 NOTE — Progress Notes (Signed)
 Safety precautions to be maintained throughout the outpatient stay will include: orient to surroundings, keep bed in low position, maintain call bell within reach at all times, provide assistance with transfer out of bed and ambulation.

## 2024-02-01 ENCOUNTER — Emergency Department

## 2024-02-01 ENCOUNTER — Other Ambulatory Visit: Payer: Self-pay

## 2024-02-01 ENCOUNTER — Encounter: Payer: Self-pay | Admitting: Emergency Medicine

## 2024-02-01 ENCOUNTER — Emergency Department
Admission: EM | Admit: 2024-02-01 | Discharge: 2024-02-01 | Disposition: A | Discharge status: Home / Self Care | Attending: Emergency Medicine | Admitting: Emergency Medicine

## 2024-02-01 ENCOUNTER — Telehealth: Payer: Self-pay

## 2024-02-01 ENCOUNTER — Encounter: Payer: Self-pay | Admitting: Nurse Practitioner

## 2024-02-01 ENCOUNTER — Ambulatory Visit: Admitting: Nurse Practitioner

## 2024-02-01 VITALS — BP 136/74 | HR 95 | Temp 95.3°F | Resp 16 | Ht 59.5 in | Wt 105.2 lb

## 2024-02-01 DIAGNOSIS — E119 Type 2 diabetes mellitus without complications: Secondary | ICD-10-CM | POA: Insufficient documentation

## 2024-02-01 DIAGNOSIS — I498 Other specified cardiac arrhythmias: Secondary | ICD-10-CM | POA: Diagnosis not present

## 2024-02-01 DIAGNOSIS — F419 Anxiety disorder, unspecified: Secondary | ICD-10-CM | POA: Diagnosis not present

## 2024-02-01 DIAGNOSIS — I10 Essential (primary) hypertension: Secondary | ICD-10-CM | POA: Insufficient documentation

## 2024-02-01 DIAGNOSIS — I6782 Cerebral ischemia: Secondary | ICD-10-CM | POA: Insufficient documentation

## 2024-02-01 DIAGNOSIS — M5412 Radiculopathy, cervical region: Secondary | ICD-10-CM

## 2024-02-01 DIAGNOSIS — I447 Left bundle-branch block, unspecified: Secondary | ICD-10-CM | POA: Insufficient documentation

## 2024-02-01 DIAGNOSIS — G8929 Other chronic pain: Secondary | ICD-10-CM | POA: Diagnosis not present

## 2024-02-01 DIAGNOSIS — F32A Depression, unspecified: Secondary | ICD-10-CM | POA: Insufficient documentation

## 2024-02-01 DIAGNOSIS — R479 Unspecified speech disturbances: Secondary | ICD-10-CM | POA: Insufficient documentation

## 2024-02-01 DIAGNOSIS — N390 Urinary tract infection, site not specified: Secondary | ICD-10-CM | POA: Insufficient documentation

## 2024-02-01 DIAGNOSIS — M542 Cervicalgia: Secondary | ICD-10-CM | POA: Insufficient documentation

## 2024-02-01 DIAGNOSIS — M4802 Spinal stenosis, cervical region: Secondary | ICD-10-CM | POA: Diagnosis not present

## 2024-02-01 DIAGNOSIS — R4781 Slurred speech: Secondary | ICD-10-CM | POA: Diagnosis not present

## 2024-02-01 DIAGNOSIS — M25562 Pain in left knee: Secondary | ICD-10-CM | POA: Insufficient documentation

## 2024-02-01 DIAGNOSIS — R519 Headache, unspecified: Secondary | ICD-10-CM | POA: Diagnosis not present

## 2024-02-01 DIAGNOSIS — R296 Repeated falls: Secondary | ICD-10-CM | POA: Insufficient documentation

## 2024-02-01 DIAGNOSIS — W19XXXA Unspecified fall, initial encounter: Secondary | ICD-10-CM | POA: Insufficient documentation

## 2024-02-01 DIAGNOSIS — G3184 Mild cognitive impairment, so stated: Secondary | ICD-10-CM | POA: Diagnosis not present

## 2024-02-01 LAB — URINE DRUG SCREEN
Amphetamines: NEGATIVE
Barbiturates: NEGATIVE
Benzodiazepines: POSITIVE — AB
Cocaine: NEGATIVE
Fentanyl: NEGATIVE
Methadone Scn, Ur: NEGATIVE
Opiates: NEGATIVE
Tetrahydrocannabinol: NEGATIVE

## 2024-02-01 LAB — CBC WITH DIFFERENTIAL/PLATELET
Abs Immature Granulocytes: 0.03 K/uL (ref 0.00–0.07)
Basophils Absolute: 0 K/uL (ref 0.0–0.1)
Basophils Relative: 0 %
Eosinophils Absolute: 0.1 K/uL (ref 0.0–0.5)
Eosinophils Relative: 1 %
HCT: 28.2 % — ABNORMAL LOW (ref 36.0–46.0)
Hemoglobin: 8.4 g/dL — ABNORMAL LOW (ref 12.0–15.0)
Immature Granulocytes: 0 %
Lymphocytes Relative: 22 %
Lymphs Abs: 2.4 K/uL (ref 0.7–4.0)
MCH: 22.5 pg — ABNORMAL LOW (ref 26.0–34.0)
MCHC: 29.8 g/dL — ABNORMAL LOW (ref 30.0–36.0)
MCV: 75.6 fL — ABNORMAL LOW (ref 80.0–100.0)
Monocytes Absolute: 0.4 K/uL (ref 0.1–1.0)
Monocytes Relative: 3 %
Neutro Abs: 7.9 K/uL — ABNORMAL HIGH (ref 1.7–7.7)
Neutrophils Relative %: 74 %
Platelets: 337 K/uL (ref 150–400)
RBC: 3.73 MIL/uL — ABNORMAL LOW (ref 3.87–5.11)
RDW: 18.9 % — ABNORMAL HIGH (ref 11.5–15.5)
Smear Review: NORMAL
WBC: 10.8 K/uL — ABNORMAL HIGH (ref 4.0–10.5)
nRBC: 0 % (ref 0.0–0.2)

## 2024-02-01 LAB — COMPREHENSIVE METABOLIC PANEL WITH GFR
ALT: 18 U/L (ref 0–44)
AST: 25 U/L (ref 15–41)
Albumin: 3.4 g/dL — ABNORMAL LOW (ref 3.5–5.0)
Alkaline Phosphatase: 89 U/L (ref 38–126)
Anion gap: 13 (ref 5–15)
BUN: 23 mg/dL (ref 8–23)
CO2: 23 mmol/L (ref 22–32)
Calcium: 9.1 mg/dL (ref 8.9–10.3)
Chloride: 102 mmol/L (ref 98–111)
Creatinine, Ser: 0.45 mg/dL (ref 0.44–1.00)
GFR, Estimated: >60 mL/min (ref >60)
Glucose, Bld: 120 mg/dL — ABNORMAL HIGH (ref 70–99)
Potassium: 4.4 mmol/L (ref 3.5–5.1)
Sodium: 138 mmol/L (ref 135–145)
Total Bilirubin: <0.2 mg/dL (ref 0.0–1.2)
Total Protein: 7 g/dL (ref 6.5–8.1)

## 2024-02-01 LAB — URINALYSIS, ROUTINE W REFLEX MICROSCOPIC
Bilirubin Urine: NEGATIVE
Glucose, UA: NEGATIVE mg/dL
Hgb urine dipstick: NEGATIVE
Ketones, ur: NEGATIVE mg/dL
Nitrite: NEGATIVE
Protein, ur: NEGATIVE mg/dL
Specific Gravity, Urine: 1.012 (ref 1.005–1.030)
pH: 5 (ref 5.0–8.0)

## 2024-02-01 LAB — TROPONIN T, HIGH SENSITIVITY: Troponin T High Sensitivity: <15 ng/L (ref 0–19)

## 2024-02-01 LAB — PROTIME-INR
INR: 1.2 (ref 0.8–1.2)
Prothrombin Time: 16.1 s — ABNORMAL HIGH (ref 11.4–15.2)

## 2024-02-01 LAB — TSH: TSH: 2.41 u[IU]/mL (ref 0.350–4.500)

## 2024-02-01 LAB — ETHANOL: Alcohol, Ethyl (B): <15 mg/dL (ref <15)

## 2024-02-01 LAB — APTT: aPTT: 37 s — ABNORMAL HIGH (ref 24–36)

## 2024-02-01 MED ORDER — LORAZEPAM 2 MG/ML IJ SOLN
1.0000 mg | Freq: Once | INTRAMUSCULAR | Status: AC | PRN
Start: 2024-02-01 — End: 2024-02-01
  Administered 2024-02-01: 1 mg via INTRAVENOUS
  Filled 2024-02-01: qty 1

## 2024-02-01 MED ORDER — OXYCODONE-ACETAMINOPHEN 5-325 MG PO TABS
1.0000 | ORAL_TABLET | Freq: Once | ORAL | Status: AC
  Administered 2024-02-01: 1 via ORAL
  Filled 2024-02-01: qty 1

## 2024-02-01 MED ORDER — CEPHALEXIN 500 MG PO CAPS: 500.0000 mg | ORAL_CAPSULE | Freq: Two times a day (BID) | ORAL | 0 refills | Status: AC

## 2024-02-01 MED ORDER — CEPHALEXIN 500 MG PO CAPS
500.0000 mg | ORAL_CAPSULE | Freq: Once | ORAL | Status: AC
  Administered 2024-02-01: 500 mg via ORAL
  Filled 2024-02-01: qty 1

## 2024-02-01 NOTE — Progress Notes (Signed)
 [facility] [address] [place], [place] [address]  Internal MEDICINE  Office Visit Note  Patient Name: Summer Murphy  [id number]  [id number]  Date of Service: 02/01/2024  Chief Complaint  Patient presents with   Diabetes   Gastroesophageal Reflux   Hypertension   Follow-up    Clemens this morning.     HPI Summer Murphy presents for a follow-up visit for left knee pain, multiple falls, neck pain and slurred speech Chronic Left knee pain -- has been seeing Dr. Kathlynn but would like a second opinion since her pain is getting worse.  Chronic pain of neck pain, and left knee pain -- sees pain management  Slurred speech x3 days, forgetfulness, memory issues -- concern for possible stroke.  Recent fall today and history of multiple falls She is on medications for pain but is taking them appropriately per her pain management specialist.    Current Medication: Outpatient Encounter Medications as of 02/01/2024  Medication Sig Note   Accu-Chek Softclix Lancets lancets USE TO TEST BLOOD SUGAR EVERY DAY AS NEEDED FOR type 2 diabetes    acetaminophen  (TYLENOL ) 500 MG tablet Take 2 tablets (1,000 mg total) by mouth every 6 (six) hours as needed. 04/22/2023: prn   albuterol  (VENTOLIN  HFA) 108 (90 Base) MCG/ACT inhaler Inhale 2 puffs into the lungs every 6 (six) hours as needed for wheezing or shortness of breath.    alendronate  (FOSAMAX ) 70 MG tablet TAKE 1 TABLET BY MOUTH WEEKLY  TAKE WITH A FULL GLASS OF WATER   ON AN EMPTY STOMACH    apixaban  (ELIQUIS ) 5 MG TABS tablet Take 1 tablet (5 mg total) by mouth 2 (two) times daily.    Blood Glucose Monitoring Suppl (ACCU-CHEK GUIDE ME) w/Device KIT Use device to check glucose daily and as need for prediabetes R73.03    busPIRone  (BUSPAR ) 10 MG tablet Take 1 tablet (10 mg total) by mouth 2 (two) times daily.    cefadroxil  (DURICEF) 500 MG capsule Take 1 capsule (500 mg total) by mouth 2 (two) times daily.    clonazePAM  (KLONOPIN ) 1 MG tablet  Take 0.5-1 tablets (0.5-1 mg total) by mouth 2 (two) times daily as needed for anxiety.    COMBIVENT  RESPIMAT 20-100 MCG/ACT AERS respimat 1 puff every 6 (six) hours.    diclofenac  Sodium (VOLTAREN ) 1 % GEL Apply 4 g topically 4 (four) times daily.    DULoxetine  (CYMBALTA ) 60 MG capsule Take 1 capsule (60 mg total) by mouth daily.    fluticasone -salmeterol (WIXELA INHUB) 250-50 MCG/ACT AEPB Inhale 1 puff into the lungs in the morning and at bedtime.    gabapentin  (NEURONTIN ) 300 MG capsule Take 1 capsule (300 mg total) by mouth 2 (two) times daily.    glucose blood (ACCU-CHEK GUIDE TEST) test strip TEST BLOOD SUGAR ONCE DAILY AND AS NEEDED FOR type 2 diabetes    ipratropium-albuterol  (DUONEB) 0.5-2.5 (3) MG/3ML SOLN Take 3 mLs by nebulization every 6 (six) hours as needed.    lidocaine  (LIDODERM ) 5 % PLACE 1 PATCH ONTO THE AFFECTED AREA OF THE NECK DAILY, REMOVE AND DISCARD WITHIN 12 HOURS OR AS DIRECTED    montelukast  (SINGULAIR ) 10 MG tablet TAKE 1 TABLET(10 MG) BY MOUTH AT BEDTIME    ondansetron  (ZOFRAN ) 4 MG tablet TAKE 1 TABLET BY MOUTH TWICE DAILY AS NEEDED    oxybutynin  (DITROPAN -XL) 10 MG 24 hr tablet TAKE 1 TABLET(10 MG) BY MOUTH AT BEDTIME    pantoprazole  (PROTONIX ) 40 MG tablet Take 1 tablet (40 mg  total) by mouth 2 (two) times daily.    polyethylene glycol (MIRALAX  / GLYCOLAX ) 17 g packet Take 17 g by mouth 2 (two) times daily.    potassium chloride  (KLOR-CON  10) 10 MEQ tablet Take 1 tablet (10 mEq total) by mouth daily.    rOPINIRole  (REQUIP ) 1 MG tablet TAKE 1 TABLET(1 MG) BY MOUTH AT BEDTIME    SUMAtriptan  (IMITREX ) 100 MG tablet Take 1 tablet (100 mg total) by mouth every 2 (two) hours as needed (for migraine headaches.). May repeat in 1 hours if headache persists or recurs.    traZODone  (DESYREL ) 150 MG tablet TAKE 1 TABLET(150 MG) BY MOUTH AT BEDTIME    [DISCONTINUED] Dupilumab  (DUPIXENT ) 300 MG/2ML SOAJ Inject 300 mg into the skin every 14 (fourteen) days. ** Schedule appointment  for further refills. **    [DISCONTINUED] HYDROcodone -acetaminophen  (NORCO/VICODIN) 5-325 MG tablet Take 1 tablet by mouth 2 (two) times daily as needed for severe pain (pain score 7-10).    [DISCONTINUED] methocarbamol  (ROBAXIN ) 750 MG tablet Take 1 tablet (750 mg total) by mouth every 8 (eight) hours as needed for muscle spasms.    No facility-administered encounter medications on file as of 02/01/2024.    Surgical History: Past Surgical History:  Procedure Laterality Date   ABDOMINAL HYSTERECTOMY     AMPUTATION TOE Left 07/31/2016   Procedure: AMPUTATION TOE/MPJ 2nd toe;  Surgeon: Neill Boas, DPM;  Location: [facility]S;  Service: Podiatry;  Laterality: Left;   APPENDECTOMY  1990   BACK SURGERY     low back   BREAST SURGERY     bilateral breast reduction   CARDIAC ELECTROPHYSIOLOGY STUDY AND ABLATION     CERVICAL WOUND DEBRIDEMENT N/A 04/17/2023   Procedure: CERVICAL WOUND DEBRIDEMENT;  Surgeon: Bluford Standing, MD;  Location: [facility];  Service: Neurosurgery;  Laterality: N/A;   CHOLECYSTECTOMY  1990   COLONOSCOPY WITH PROPOFOL  N/A 01/04/2017   Procedure: COLONOSCOPY WITH PROPOFOL ;  Surgeon: Viktoria Lamar DASEN, MD;  Location: [facility] ENDOSCOPY;  Service: Endoscopy;  Laterality: N/A;   CORNEAL TRANSPLANT     ESOPHAGOGASTRODUODENOSCOPY N/A 04/09/2021   Procedure: ESOPHAGOGASTRODUODENOSCOPY (EGD);  Surgeon: Unk Corinn Skiff, MD;  Location: [facility] ENDOSCOPY;  Service: Gastroenterology;  Laterality: N/A;   ESOPHAGOGASTRODUODENOSCOPY (EGD) WITH PROPOFOL  N/A 01/04/2017   Procedure: ESOPHAGOGASTRODUODENOSCOPY (EGD) WITH PROPOFOL ;  Surgeon: Viktoria Lamar DASEN, MD;  Location: [facility] ENDOSCOPY;  Service: Endoscopy;  Laterality: N/A;   EXCISION BONE CYST Left 07/31/2016   Procedure: EXCISION BONE CYST/exostectomy 28124/left 2nd;  Surgeon: Neill Boas, DPM;  Location: [facility];  Service: Podiatry;  Laterality: Left;   EXTRACORPOREAL SHOCK WAVE LITHOTRIPSY Left 09/12/2015   Procedure: EXTRACORPOREAL SHOCK  WAVE LITHOTRIPSY (ESWL);  Surgeon: Rosina Riis, MD;  Location: [facility];  Service: Urology;  Laterality: Left;   FRACTURE SURGERY     left foot   GUM SURGERY Left    gum infection   HARDWARE REMOVAL Left 11/14/2020   Procedure: HARDWARE REMOVAL;  Surgeon: Kathlynn Sharper, MD;  Location: [facility];  Service: Orthopedics;  Laterality: Left;   HH repair     Fundoplication   JOINT REPLACEMENT Bilateral 2013,2014   total knees   LAPAROSCOPIC HYSTERECTOMY     LAPAROTOMY N/A 11/27/2023   Procedure: LAPAROTOMY, EXPLORATORY lysis of adhesions;  Surgeon: Lane Shope, MD;  Location: [facility];  Service: General;  Laterality: N/A;   LITHOTRIPSY     periprosthetic supracondylar fracture of left femur  02/16/2020   [facility]   POSTERIOR CERVICAL FUSION/FORAMINOTOMY N/A 03/02/2023   Procedure:  C3-6 POSTERIOR CERVICAL LAMINECTOMY AND FUSION;  Surgeon: Claudene Penne ORN, MD;  Location: [facility];  Service: Neurosurgery;  Laterality: N/A;   TONSILLECTOMY     TOTAL KNEE REVISION Right 11/14/2019   Procedure: Revision patella and tibial polyethylene;  Surgeon: Kathlynn Sharper, MD;  Location: [facility];  Service: Orthopedics;  Laterality: Right;   URETEROSCOPY      Medical History: Past Medical History:  Diagnosis Date   Acid reflux    Anemia    Anxiety    a.) buspirone  BID + BZO PRN (clonazepam )   Arthritis    Aspiration pneumonia (HCC) 06/02/2022   Asthma    CAP (community acquired pneumonia) 04/24/2022   Cervical disc disorder with radiculopathy of cervical region    Chronic pain syndrome 05/25/2023   a.) on COT as prescribed by pain mangement   Chronic, continuous use of opioids    a.) followed by pain mangement   COPD (chronic obstructive pulmonary disease) (HCC)    DOE (dyspnea on exertion)    Hematuria    Hiatal hernia    History of kidney stones    Hypertension    Hypertensive urgency 05/12/2022   Hypoglycemia    Insomnia    a.) uses Trazodone  PRN   LBBB (left bundle branch  block)    Migraine    MSSA bacteremia 04/19/2023   Murmur    Osteopenia    Palpitations    Pre-diabetes    Restless leg    a.) on ropinirole    Sepsis (HCC) 05/12/2022   Stenosis of cervical spine with myelopathy (HCC)    T2DM (type 2 diabetes mellitus) (HCC)     Family History: Family History  Problem Relation Age of Onset   Hypertension Mother    Stroke Mother    Prostate cancer Father    Kidney Stones Father    Diabetes Brother    Hypertension Brother    Breast cancer Maternal Aunt    Kidney disease Neg Hx     Social History   Socioeconomic History   Marital status: Widowed    Spouse name: Not on file   Number of children: Not on file   Years of education: Not on file   Highest education level: Not on file  Occupational History   Not on file  Tobacco Use   Smoking status: Never    Passive exposure: Past   Smokeless tobacco: Never  Vaping Use   Vaping status: Never Used  Substance and Sexual Activity   Alcohol use: No   Drug use: No   Sexual activity: Not Currently  Other Topics Concern   Not on file  Social History Narrative   Lives at home alone. Relatives are the support person.    Social Drivers of Health   Tobacco Use: Low Risk (02/23/2024)   Patient History    Smoking Tobacco Use: Never    Smokeless Tobacco Use: Never    Passive Exposure: Past  Financial Resource Strain: Low Risk  (02/23/2024)   Received from [facility]   Overall Financial Resource Strain (CARDIA)    Difficulty of Paying Living Expenses: Not hard at all  Food Insecurity: No Food Insecurity (02/23/2024)   Received from [facility]   Epic    Within the past 12 months, you worried that your food would run out before you got the money to buy more.: Never true    Within the past 12 months, the food you bought just didn't last and  you didn't have money to get more.: Never true  Transportation Needs: No Transportation Needs (02/23/2024)   Received  from [facility] - Transportation    In the past 12 months, has lack of transportation kept you from medical appointments or from getting medications?: No    Lack of Transportation (Non-Medical): No  Physical Activity: Not on file  Stress: Not on file  Social Connections: Moderately Integrated (11/26/2023)   Social Connection and Isolation Panel    Frequency of Communication with Friends and Family: More than three times a week    Frequency of Social Gatherings with Friends and Family: More than three times a week    Attends Religious Services: 1 to 4 times per year    Active Member of [employer] or Organizations: Yes    Attends Banker Meetings: 1 to 4 times per year    Marital Status: Widowed  Intimate Partner Violence: Not At Risk (11/26/2023)   Epic    Fear of Current or Ex-Partner: No    Emotionally Abused: No    Physically Abused: No    Sexually Abused: No  Depression (PHQ2-9): Low Risk (02/21/2024)   Depression (PHQ2-9)    PHQ-2 Score: 0  Alcohol Screen: Low Risk (12/31/2021)   Alcohol Screen    Last Alcohol Screening Score (AUDIT): 0  Housing: Low Risk  (02/23/2024)   Received from [facility]   Epic    In the last 12 months, was there a time when you were not able to pay the mortgage or rent on time?: No    In the past 12 months, how many times have you moved where you were living?: 0    At any time in the past 12 months, were you homeless or living in a shelter (including now)?: No  Utilities: Not At Risk (02/23/2024)   Received from [facility]   Epic    In the past 12 months has the electric, gas, oil, or water  company threatened to shut off services in your home?: No  Health Literacy: Not on file      Review of Systems  Constitutional:  Positive for appetite change, fatigue and unexpected weight change. Negative for fever.  HENT:  Negative for congestion, mouth sores and postnasal drip.    Respiratory: Negative.  Negative for cough, chest tightness, shortness of breath and wheezing.   Cardiovascular: Negative.  Negative for chest pain and palpitations.  Genitourinary:  Negative for flank pain.  Musculoskeletal:  Positive for arthralgias, back pain and neck pain.  Neurological:        Forgetfulness  Psychiatric/Behavioral:  Positive for behavioral problems and sleep disturbance. Negative for self-injury and suicidal ideas. The patient is nervous/anxious.     Vital Signs: BP 136/74   Pulse 95   Temp (!) 95.3 F (35.2 C)   Resp 16   Ht 4' 11.5 (1.511 m)   Wt 105 lb 3.2 oz (47.7 kg)   SpO2 98%   BMI 20.89 kg/m    Physical Exam Vitals reviewed.  Constitutional:      General: She is not in acute distress.    Appearance: Normal appearance. She is normal weight. She is not ill-appearing.  HENT:     Head: Normocephalic and atraumatic.  Eyes:     Pupils: Pupils are equal, round, and reactive to light.  Cardiovascular:     Rate and Rhythm: Normal rate and regular rhythm.  Pulmonary:  Effort: Pulmonary effort is normal. No respiratory distress.  Musculoskeletal:     Cervical back: Pain with movement present. Decreased range of motion.     Right knee: Decreased range of motion.     Left knee: Swelling and effusion present. Decreased range of motion. Tenderness present.  Neurological:     Mental Status: She is alert and oriented to person, place, and time.  Psychiatric:        Mood and Affect: Mood normal.        Behavior: Behavior normal.        Assessment/Plan: 1. Slurred speech (Primary) Encouraged patient to go to the ER for CT head and to make sure she has not had a stroke  2. MCI (mild cognitive impairment) with memory loss Encouraged patient to go to the ER for CT head and to make sure she has not had a stroke  3. Chronic pain of left knee Considering getting a second opinion from a different orthopedic surgeon. Wants to have a CT scan of her  knee   4. Cervical radicular pain (left severe) Chronic pain, sees neurosurgery   5. Spinal stenosis in cervical region Chronic pain, sees neurosurgery    General Counseling: Jazmon verbalizes understanding of the findings of todays visit and agrees with plan of treatment. I have discussed any further diagnostic evaluation that may be needed or ordered today. We also reviewed her medications today. she has been encouraged to call the office with any questions or concerns that should arise related to todays visit.    No orders of the defined types were placed in this encounter.   No orders of the defined types were placed in this encounter.   Return in about 22 days (around 02/23/2024) for F/U, Tarik Teixeira PCP CT head results. .   Total time spent:30 Minutes Time spent includes review of chart, medications, test results, and follow up plan with the patient.   [place] Controlled Substance Database was reviewed by me.  This patient was seen by Mardy Maxin, FNP-C in collaboration with Dr. Sigrid Bathe as a part of collaborative care agreement.   Taje Tondreau R. Maxin, MSN, FNP-C Internal medicine

## 2024-02-01 NOTE — ED Notes (Addendum)
 Attempted to help patient call family friends to come pick her up. Pt currently locked out of her phone and unable to contact the person who is suppose to pick her up. Charge RN aware. Pt friend Clarita called the hospital back and stated she will try to reach out to one of the patients friends to see if they can pick her up .   2245: Pt updated that Clarita called the hospital back and was going to try to get in contact with patients friend who can give her a ride home.

## 2024-02-01 NOTE — Telephone Encounter (Signed)
 Post procedure follow up.  LM

## 2024-02-01 NOTE — Telephone Encounter (Signed)
 Error

## 2024-02-01 NOTE — ED Triage Notes (Signed)
 Patient to ED via [facility] from home for a fall and slurred speech. PT reports slurred speech x1 month. States had multiple falls- last one this AM. Bruising noted to forehead. +blood thinners. C/o right ear, back and headache. PT alert and disoriented to time.

## 2024-02-01 NOTE — ED Provider Notes (Signed)
 [facility] Provider Note    Event Date/Time   First MD Initiated Contact with Patient 02/01/24 1851     (approximate)   History   Fall (everlean) and Aphasia   HPI  Summer Murphy is a 68 y.o. female with a history of hypertension, GERD, and diabetes who presents with multiple complaints.  The patient reports slurred speech, multiple falls including a fall earlier today, increased forgetfulness, headache, chronic neck pain, and chronic left knee pain.  The slurred speech has been for approximately 3 days, and the other symptoms are more subacute.  She also reports increased depression.  She denies SI or HI.  I reviewed the past medical records.  The patient was seen by primary care earlier today for these complaints and subsequently came to the ED.  Previously, her most recent outpatient encounter was with pain management yesterday for a genicular nerve block.  On 11/5 she was seen by surgery for a postoperative visit for exploratory laparotomy and lysis of adhesions that had been done in September.   Physical Exam   Triage Vital Signs: ED Triage Vitals  Encounter Vitals Group     BP 02/01/24 1401 123/78     Girls Systolic BP Percentile --      Girls Diastolic BP Percentile --      Boys Systolic BP Percentile --      Boys Diastolic BP Percentile --      Pulse Rate 02/01/24 1401 95     Resp 02/01/24 1401 17     Temp 02/01/24 1401 98.3 F (36.8 C)     Temp Source 02/01/24 1401 Oral     SpO2 02/01/24 1401 94 %     Weight 02/01/24 1402 103 lb 9.9 oz (47 kg)     Height 02/01/24 1402 4' 11.5 (1.511 m)     Head Circumference --      Peak Flow --      Pain Score 02/01/24 1402 7     Pain Loc --      Pain Education --      Exclude from Growth Chart --     Most recent vital signs: Vitals:   02/01/24 2200 02/01/24 2209  BP: (!) 140/76   Pulse: 95   Resp:    Temp:  98.6 F (37 C)  SpO2:       General: Alert and oriented, somewhat anxious  appearing, no distress.  CV:  Good peripheral perfusion.  Resp:  Normal effort.  Abd:  No distention.  Other:  Somewhat anxious and intermittently agitated appearing.  EOMI.  PERRLA.  Normal speech.  Motor intact in all extremities.  No ataxia.   ED Results / Procedures / Treatments   Labs (all labs ordered are listed, but only abnormal results are displayed) Labs Reviewed  PROTIME-INR - Abnormal; Notable for the following components:      Result Value   Prothrombin Time 16.1 (*)    All other components within normal limits  APTT - Abnormal; Notable for the following components:   aPTT 37 (*)    All other components within normal limits  COMPREHENSIVE METABOLIC PANEL WITH GFR - Abnormal; Notable for the following components:   Glucose, Bld 120 (*)    Albumin 3.4 (*)    All other components within normal limits  CBC WITH DIFFERENTIAL/PLATELET - Abnormal; Notable for the following components:   WBC 10.8 (*)    RBC 3.73 (*)    Hemoglobin 8.4 (*)  HCT 28.2 (*)    MCV 75.6 (*)    MCH 22.5 (*)    MCHC 29.8 (*)    RDW 18.9 (*)    Neutro Abs 7.9 (*)    All other components within normal limits  URINALYSIS, ROUTINE W REFLEX MICROSCOPIC - Abnormal; Notable for the following components:   Color, Urine YELLOW (*)    APPearance CLEAR (*)    Leukocytes,Ua MODERATE (*)    Bacteria, UA RARE (*)    All other components within normal limits  URINE DRUG SCREEN - Abnormal; Notable for the following components:   Benzodiazepines POSITIVE (*)    All other components within normal limits  ETHANOL  TSH  I-STAT CREATININE, ED  CBG MONITORING, ED  TROPONIN T, HIGH SENSITIVITY  TROPONIN T, HIGH SENSITIVITY     EKG  ED ECG REPORT I, Waylon Cassis, the attending physician, personally viewed and interpreted this ECG.  Date: 02/01/2024 EKG Time: 1411 Rate: 96 Rhythm: normal sinus rhythm QRS Axis: normal Intervals: LBBB ST/T Wave abnormalities: normal Narrative Interpretation:  no evidence of acute ischemia    RADIOLOGY  CT head: I independently viewed and interpreted the images; there is no ICH.  Radiology reports no acute abnormality, although study is limited due to motion.  CT cervical spine: No acute fracture.  IMPRESSION:  1. No evidence of acute cervical spine fracture, traumatic  subluxation or static signs of instability.  2. Stable postsurgical changes from posterior decompression and  laminar screw and rod fusion from C3 through C6. Chronic loosening  of the C3 and C6 laminar screws bilaterally.  3. No large disc herniation or high-grade foraminal narrowing  identified.   MR brain:  IMPRESSION:  1. No acute intracranial abnormality.  2. Mild chronic microvascular ischemic disease for age.    PROCEDURES:  Critical Care performed: No  Procedures   MEDICATIONS ORDERED IN ED: Medications  cephALEXin  (KEFLEX ) capsule 500 mg (has no administration in time range)  LORazepam  (ATIVAN ) injection 1 mg (1 mg Intravenous Given 02/01/24 1958)  oxyCODONE -acetaminophen  (PERCOCET/ROXICET) 5-325 MG per tablet 1 tablet (1 tablet Oral Given 02/01/24 1921)     IMPRESSION / MDM / ASSESSMENT AND PLAN / ED COURSE  I reviewed the triage vital signs and the nursing notes.  68 year old female with PMH as noted above presents with apparent slurred speech for the approximately the last 3 days, as well as multiple falls, memory problems, and headache which are more subacute.  On exam her vital signs are normal except for mild hypertension.  Neurologic exam is nonfocal.  Differential diagnosis includes, but is not limited to, subacute CVA, brain lesion, electrolyte abnormality, other metabolic cause, thyroid  dysfunction, UTI or other infection.  CMP obtained from triage shows no acute findings.  CT head and cervical spine are also negative but limited due to motion artifact.  We will obtain a CBC, TSH, UA, cardiac enzymes, UDS, and an MRI of the brain for further  evaluation.  Patient's presentation is most consistent with acute presentation with potential threat to life or bodily function.  The patient is on the cardiac monitor to evaluate for evidence of arrhythmia and/or significant heart rate changes.   ----------------------------------------- 10:26 PM on 02/01/2024 -----------------------------------------  CBC shows anemia which is chronic and unchanged from baseline.  TSH is within normal limits.  Urinalysis shows evidence of a possible UTI.  This could be contributing to the patient's symptoms.  Troponin is negative.  There is no indication for repeat given the duration  of the symptoms.  Drug screen is positive for benzodiazepines.  MRI shows no acute intracranial findings.  On reassessment the patient is comfortable.  I did consider whether she may benefit from inpatient admission for further workup of the reported slurred speech and possible PT/OT eval, though she has no objective neurologic findings on exam.  I counseled the patient on the results of the workup and offered admission to the patient.  However, the patient states that she would strongly prefer to go home.  She feels comfortable to go home and states that she is safe.  Given the negative workup I think this is reasonable.  I will treat her for UTI.  She is in agreement with this plan.  I have prescribed oral Keflex .  I instructed her to follow-up with her primary care provider.  I gave strict return precautions, and she expressed understanding and agreement.   FINAL CLINICAL IMPRESSION(S) / ED DIAGNOSES   Final diagnoses:  Speech disturbance, unspecified type  Fall, initial encounter  Urinary tract infection without hematuria, site unspecified     Rx / DC Orders   ED Discharge Orders          Ordered    cephALEXin  (KEFLEX ) 500 MG capsule  2 times daily        02/01/24 2225             Note:  This document was prepared using Dragon voice recognition software and  may include unintentional dictation errors.    Jacolyn Pae, MD 02/01/24 2229

## 2024-02-01 NOTE — ED Notes (Signed)
 Assisted patient in walking to the bathroom. Pt +1 assist. Pt was holding onto the bed and the wall for additional support.

## 2024-02-01 NOTE — ED Notes (Signed)
 Per Jacolyn Pae, MD pt does not need a second troponin.

## 2024-02-01 NOTE — Discharge Instructions (Addendum)
 Take the antibiotic as prescribed and finish the full course.  Follow-up with your primary care provider.  Return to the ER for new, worsening, or persistent severe weakness, speech problems, dizziness, difficulty with balance, recurrent falls, fever, vomiting, or any other new or worsening symptoms that concern you.

## 2024-02-01 NOTE — ED Notes (Signed)
 Pt given her friends Mable's number. Pt also given the unit phone to call her.

## 2024-02-02 ENCOUNTER — Telehealth: Payer: Self-pay | Admitting: Nurse Practitioner

## 2024-02-02 NOTE — Telephone Encounter (Signed)
 Awaiting 02/01/24 office notes for Orthopedic referral-Toni

## 2024-02-02 NOTE — Telephone Encounter (Signed)
 Notified patient of CT appointment date, arrival time, location-Toni

## 2024-02-04 ENCOUNTER — Encounter: Payer: Self-pay | Admitting: Nurse Practitioner

## 2024-02-04 ENCOUNTER — Ambulatory Visit: Admitting: Nurse Practitioner

## 2024-02-04 VITALS — BP 107/60 | HR 105 | Temp 97.4°F | Resp 16 | Ht 59.5 in | Wt 105.8 lb

## 2024-02-04 DIAGNOSIS — E876 Hypokalemia: Secondary | ICD-10-CM

## 2024-02-04 DIAGNOSIS — M4802 Spinal stenosis, cervical region: Secondary | ICD-10-CM | POA: Diagnosis not present

## 2024-02-04 DIAGNOSIS — N390 Urinary tract infection, site not specified: Secondary | ICD-10-CM

## 2024-02-04 DIAGNOSIS — M5412 Radiculopathy, cervical region: Secondary | ICD-10-CM | POA: Diagnosis not present

## 2024-02-04 DIAGNOSIS — M064 Inflammatory polyarthropathy: Secondary | ICD-10-CM

## 2024-02-04 DIAGNOSIS — M25462 Effusion, left knee: Secondary | ICD-10-CM

## 2024-02-04 DIAGNOSIS — R319 Hematuria, unspecified: Secondary | ICD-10-CM | POA: Diagnosis not present

## 2024-02-04 DIAGNOSIS — M25562 Pain in left knee: Secondary | ICD-10-CM | POA: Diagnosis not present

## 2024-02-04 DIAGNOSIS — G8929 Other chronic pain: Secondary | ICD-10-CM

## 2024-02-04 MED ORDER — METHOCARBAMOL 750 MG PO TABS: 750.0000 mg | ORAL_TABLET | Freq: Four times a day (QID) | ORAL | 5 refills | Status: DC | PRN

## 2024-02-04 NOTE — Progress Notes (Signed)
 [facility] [address] [place], [place] [address]  Internal MEDICINE  Office Visit Note  Patient Name: Summer Murphy  [id number]  [id number]  Date of Service: 02/04/2024  Chief Complaint  Patient presents with   Follow-up    ED f/u uti    HPI Sabriyah presents for a follow-up visit for chronic left knee pain, recent ED visit, UTI.  Chronic pain and swelling in the left knee, warm to touch and redness, concern for possible infection in joint  UTI -- taking antibiotic from ED  Chronic neck pain with spinal stenosis, post surgery of the cervical spine and osteomyelitis of the cervical spine which she is still seeing infectious disease for with Dr. Reno.  Low potassium.     Current Medication: Outpatient Encounter Medications as of 02/04/2024  Medication Sig Note   Accu-Chek Softclix Lancets lancets USE TO TEST BLOOD SUGAR EVERY DAY AS NEEDED FOR type 2 diabetes    acetaminophen  (TYLENOL ) 500 MG tablet Take 2 tablets (1,000 mg total) by mouth every 6 (six) hours as needed. 04/22/2023: prn   albuterol  (VENTOLIN  HFA) 108 (90 Base) MCG/ACT inhaler Inhale 2 puffs into the lungs every 6 (six) hours as needed for wheezing or shortness of breath.    alendronate  (FOSAMAX ) 70 MG tablet TAKE 1 TABLET BY MOUTH WEEKLY  TAKE WITH A FULL GLASS OF WATER   ON AN EMPTY STOMACH    apixaban  (ELIQUIS ) 5 MG TABS tablet Take 1 tablet (5 mg total) by mouth 2 (two) times daily.    Blood Glucose Monitoring Suppl (ACCU-CHEK GUIDE ME) w/Device KIT Use device to check glucose daily and as need for prediabetes R73.03    busPIRone  (BUSPAR ) 10 MG tablet Take 1 tablet (10 mg total) by mouth 2 (two) times daily.    [EXPIRED] cephALEXin  (KEFLEX ) 500 MG capsule Take 1 capsule (500 mg total) by mouth 2 (two) times daily for 7 days.    clonazePAM  (KLONOPIN ) 1 MG tablet Take 0.5-1 tablets (0.5-1 mg total) by mouth 2 (two) times daily as needed for anxiety.    COMBIVENT  RESPIMAT 20-100 MCG/ACT AERS respimat  1 puff every 6 (six) hours.    diclofenac  Sodium (VOLTAREN ) 1 % GEL Apply 4 g topically 4 (four) times daily.    DULoxetine  (CYMBALTA ) 60 MG capsule Take 1 capsule (60 mg total) by mouth daily.    fluticasone -salmeterol (WIXELA INHUB) 250-50 MCG/ACT AEPB Inhale 1 puff into the lungs in the morning and at bedtime.    gabapentin  (NEURONTIN ) 300 MG capsule Take 1 capsule (300 mg total) by mouth 2 (two) times daily.    glucose blood (ACCU-CHEK GUIDE TEST) test strip TEST BLOOD SUGAR ONCE DAILY AND AS NEEDED FOR type 2 diabetes    ipratropium-albuterol  (DUONEB) 0.5-2.5 (3) MG/3ML SOLN Take 3 mLs by nebulization every 6 (six) hours as needed.    lidocaine  (LIDODERM ) 5 % PLACE 1 PATCH ONTO THE AFFECTED AREA OF THE NECK DAILY, REMOVE AND DISCARD WITHIN 12 HOURS OR AS DIRECTED    methocarbamol  (ROBAXIN ) 750 MG tablet Take 1-2 tablets (750-1,500 mg total) by mouth every 6 (six) hours as needed for muscle spasms.    montelukast  (SINGULAIR ) 10 MG tablet TAKE 1 TABLET(10 MG) BY MOUTH AT BEDTIME    ondansetron  (ZOFRAN ) 4 MG tablet TAKE 1 TABLET BY MOUTH TWICE DAILY AS NEEDED    oxybutynin  (DITROPAN -XL) 10 MG 24 hr tablet TAKE 1 TABLET(10 MG) BY MOUTH AT BEDTIME    pantoprazole  (PROTONIX ) 40 MG tablet Take 1  tablet (40 mg total) by mouth 2 (two) times daily.    polyethylene glycol (MIRALAX  / GLYCOLAX ) 17 g packet Take 17 g by mouth 2 (two) times daily.    potassium chloride  (KLOR-CON  10) 10 MEQ tablet Take 1 tablet (10 mEq total) by mouth daily.    rOPINIRole  (REQUIP ) 1 MG tablet TAKE 1 TABLET(1 MG) BY MOUTH AT BEDTIME    traZODone  (DESYREL ) 150 MG tablet TAKE 1 TABLET(150 MG) BY MOUTH AT BEDTIME    [DISCONTINUED] cefadroxil  (DURICEF) 500 MG capsule Take 1 capsule (500 mg total) by mouth 2 (two) times daily.    [DISCONTINUED] Dupilumab  (DUPIXENT ) 300 MG/2ML SOAJ Inject 300 mg into the skin every 14 (fourteen) days. ** Schedule appointment for further refills. **    [DISCONTINUED] HYDROcodone -acetaminophen   (NORCO/VICODIN) 5-325 MG tablet Take 1 tablet by mouth 2 (two) times daily as needed for severe pain (pain score 7-10).    [DISCONTINUED] methocarbamol  (ROBAXIN ) 750 MG tablet Take 1 tablet (750 mg total) by mouth every 8 (eight) hours as needed for muscle spasms.    [DISCONTINUED] SUMAtriptan  (IMITREX ) 100 MG tablet Take 1 tablet (100 mg total) by mouth every 2 (two) hours as needed (for migraine headaches.). May repeat in 1 hours if headache persists or recurs.    No facility-administered encounter medications on file as of 02/04/2024.    Surgical History: Past Surgical History:  Procedure Laterality Date   ABDOMINAL HYSTERECTOMY     AMPUTATION TOE Left 07/31/2016   Procedure: AMPUTATION TOE/MPJ 2nd toe;  Surgeon: Neill Boas, DPM;  Location: [facility] ORS;  Service: Podiatry;  Laterality: Left;   APPENDECTOMY  1990   BACK SURGERY     low back   BREAST SURGERY     bilateral breast reduction   CARDIAC ELECTROPHYSIOLOGY STUDY AND ABLATION     CERVICAL WOUND DEBRIDEMENT N/A 04/17/2023   Procedure: CERVICAL WOUND DEBRIDEMENT;  Surgeon: Bluford Standing, MD;  Location: [facility]S;  Service: Neurosurgery;  Laterality: N/A;   CHOLECYSTECTOMY  1990   COLONOSCOPY WITH PROPOFOL  N/A 01/04/2017   Procedure: COLONOSCOPY WITH PROPOFOL ;  Surgeon: Viktoria Lamar DASEN, MD;  Location[facility];  Service: Endoscopy;  Laterality: N/A;   CORNEAL TRANSPLANT     ESOPHAGOGASTRODUODENOSCOPY N/A 04/09/2021   Procedure: ESOPHAGOGASTRODUODENOSCOPY (EGD);  Surgeon: Unk Corinn Skiff, MD;  Location: [facility] ENDOSCOPY;  Service: Gastroenterology;  Laterality: N/A;   ESOPHAGOGASTRODUODENOSCOPY (EGD) WITH PROPOFOL  N/A 01/04/2017   Procedure: ESOPHAGOGASTRODUODENOSCOPY (EGD) WITH PROPOFOL ;  Surgeon: Viktoria Lamar DASEN, MD;  Location: [facility] ENDOSCOPY;  Service: Endoscopy;  Laterality: N/A;   EXCISION BONE CYST Left 07/31/2016   Procedure: EXCISION BONE CYST/exostectomy 28124/left 2nd;  Surgeon: Neill Boas, DPM;  Location: [facility]  ORS;  Service: Podiatry;  Laterality: Left;   EXTRACORPOREAL SHOCK WAVE LITHOTRIPSY Left 09/12/2015   Procedure: EXTRACORPOREAL SHOCK WAVE LITHOTRIPSY (ESWL);  Surgeon: Rosina Riis, MD;  Location: [facility] OR[facility];  Service: Urology;  Laterality: Left;   FRACTURE SURGERY     left foot   GUM SURGERY Left    gum infection   HARDWARE REMOVAL Left 11/14/2020   Procedure: HARDWARE REMOVAL;  Surgeon: Kathlynn Sharper, MD;  Location: [facility] OR[facility];  Service: Orthopedics;  Laterality: Left;   HH repair     Fundoplication   JOINT REPLACEMENT Bilateral 2013,2014   total knees   LAPAROSCOPIC HYSTERECTOMY     LAPAROTOMY N/A 11/27/2023   Procedure: LAPAROTOMY, EXPLORATORY lysis of adhesions;  Surgeon: Lane Shope, MD;  Location: [facility] ORS;  Service: General;  Laterality: N/A;   LITHOTRIPSY  periprosthetic supracondylar fracture of left femur  02/16/2020   [facility]   POSTERIOR CERVICAL FUSION/FORAMINOTOMY N/A 03/02/2023   Procedure: C3-6 POSTERIOR CERVICAL LAMINECTOMY AND FUSION;  Surgeon: Claudene Penne ORN, MD;  Location: [facility] OR[facility];  Service: Neurosurgery;  Laterality: N/A;   TONSILLECTOMY     TOTAL KNEE REVISION Right 11/14/2019   Procedure: Revision patella and tibial polyethylene;  Surgeon: Kathlynn Sharper, MD;  Location: [facility] OR[facility];  Service: Orthopedics;  Laterality: Right;   URETEROSCOPY      Medical History: Past Medical History:  Diagnosis Date   Acid reflux    Anemia    Anxiety    a.) buspirone  BID + BZO PRN (clonazepam )   Arthritis    Aspiration pneumonia (HCC) 06/02/2022   Asthma    CAP (community acquired pneumonia) 04/24/2022   Cervical disc disorder with radiculopathy of cervical region    Chronic pain syndrome 05/25/2023   a.) on COT as prescribed by pain mangement   Chronic, continuous use of opioids    a.) followed by pain mangement   COPD (chronic obstructive pulmonary disease) (HCC)    DOE (dyspnea on exertion)    Hematuria    Hiatal hernia    History of kidney stones     Hypertension    Hypertensive urgency 05/12/2022   Hypoglycemia    Insomnia    a.) uses Trazodone  PRN   LBBB (left bundle branch block)    Migraine    MSSA bacteremia 04/19/2023   Murmur    Osteopenia    Palpitations    Pre-diabetes    Restless leg    a.) on ropinirole    Sepsis (HCC) 05/12/2022   Stenosis of cervical spine with myelopathy (HCC)    T2DM (type 2 diabetes mellitus) (HCC)     Family History: Family History  Problem Relation Age of Onset   Hypertension Mother    Stroke Mother    Prostate cancer Father    Kidney Stones Father    Diabetes Brother    Hypertension Brother    Breast cancer Maternal Aunt    Kidney disease Neg Hx     Social History   Socioeconomic History   Marital status: Widowed    Spouse name: Not on file   Number of children: Not on file   Years of education: Not on file   Highest education level: Not on file  Occupational History   Not on file  Tobacco Use   Smoking status: Never    Passive exposure: Past   Smokeless tobacco: Never  Vaping Use   Vaping status: Never Used  Substance and Sexual Activity   Alcohol use: No   Drug use: No   Sexual activity: Not Currently  Other Topics Concern   Not on file  Social History Narrative   Lives at home alone. Relatives are the support person.    Social Drivers of Health   Tobacco Use: Low Risk (03/13/2024)   Patient History    Smoking Tobacco Use: Never    Smokeless Tobacco Use: Never    Passive Exposure: Past  Financial Resource Strain: Low Risk  (02/23/2024)   Received from [facility]   Overall Financial Resource Strain (CARDIA)    Difficulty of Paying Living Expenses: Not hard at all  Food Insecurity: No Food Insecurity (02/23/2024)   Received from [facility]   Epic    Within the past 12 months, you worried that your food would run out before you got the  money to buy more.: Never true    Within the past 12 months, the food you bought just  didn't last and you didn't have money to get more.: Never true  Transportation Needs: No Transportation Needs (02/23/2024)   Received from [facility] - Transportation    In the past 12 months, has lack of transportation kept you from medical appointments or from getting medications?: No    Lack of Transportation (Non-Medical): No  Physical Activity: Not on file  Stress: Not on file  Social Connections: Moderately Integrated (11/26/2023)   Social Connection and Isolation Panel    Frequency of Communication with Friends and Family: More than three times a week    Frequency of Social Gatherings with Friends and Family: More than three times a week    Attends Religious Services: 1 to 4 times per year    Active Member of [employer] or Organizations: Yes    Attends Banker Meetings: 1 to 4 times per year    Marital Status: Widowed  Intimate Partner Violence: Not At Risk (11/26/2023)   Epic    Fear of Current or Ex-Partner: No    Emotionally Abused: No    Physically Abused: No    Sexually Abused: No  Depression (PHQ2-9): Low Risk (02/21/2024)   Depression (PHQ2-9)    PHQ-2 Score: 0  Alcohol Screen: Low Risk (12/31/2021)   Alcohol Screen    Last Alcohol Screening Score (AUDIT): 0  Housing: Low Risk  (02/23/2024)   Received from [facility]   Epic    In the last 12 months, was there a time when you were not able to pay the mortgage or rent on time?: No    In the past 12 months, how many times have you moved where you were living?: 0    At any time in the past 12 months, were you homeless or living in a shelter (including now)?: No  Utilities: Not At Risk (02/23/2024)   Received from [facility]   Epic    In the past 12 months has the electric, gas, oil, or water  company threatened to shut off services in your home?: No  Health Literacy: Not on file      Review of Systems  Constitutional:  Positive for appetite change,  fatigue and unexpected weight change. Negative for fever.  HENT:  Negative for congestion, mouth sores and postnasal drip.   Respiratory: Negative.  Negative for cough, chest tightness, shortness of breath and wheezing.   Cardiovascular: Negative.  Negative for chest pain and palpitations.  Genitourinary:  Negative for flank pain.  Musculoskeletal:  Positive for arthralgias, back pain and neck pain.  Neurological:  Negative for speech difficulty (slurred speech is resolved.).       Forgetfulness  Psychiatric/Behavioral:  Positive for behavioral problems and sleep disturbance. Negative for self-injury and suicidal ideas. The patient is nervous/anxious.     Vital Signs: BP 107/60   Pulse 98   Temp (!) 97.4 F (36.3 C)   Resp 16   Ht 4' 11.5 (1.511 m)   Wt 105 lb 12.8 oz (48 kg)   SpO2 98%   BMI 21.01 kg/m    Physical Exam Vitals reviewed.  Constitutional:      General: She is not in acute distress.    Appearance: Normal appearance. She is normal weight. She is not ill-appearing.  HENT:     Head: Normocephalic and atraumatic.  Eyes:  Pupils: Pupils are equal, round, and reactive to light.  Cardiovascular:     Rate and Rhythm: Normal rate and regular rhythm.  Pulmonary:     Effort: Pulmonary effort is normal. No respiratory distress.  Musculoskeletal:     Cervical back: Pain with movement present. Decreased range of motion.     Right knee: Decreased range of motion.     Left knee: Swelling and effusion present. Decreased range of motion. Tenderness present.  Neurological:     Mental Status: She is alert and oriented to person, place, and time.  Psychiatric:        Mood and Affect: Mood normal.        Behavior: Behavior normal.        Assessment/Plan: 1. Chronic pain of left knee (Primary) CT of left knee ordered  - CT KNEE LEFT WO CONTRAST; Future  2. Urinary tract infection with hematuria, site unspecified Continue antibiotic prescribed by the ED provider  until course is completed. She is already starting to feel better and is more clear-headed.   3. Cervical radicular pain (left severe) Continue methocarbamol  as needed  - methocarbamol  (ROBAXIN ) 750 MG tablet; Take 1-2 tablets (750-1,500 mg total) by mouth every 6 (six) hours as needed for muscle spasms.  Dispense: 120 tablet; Refill: 5  4. Spinal stenosis in cervical region Continue methocarbamol  as needed  - methocarbamol  (ROBAXIN ) 750 MG tablet; Take 1-2 tablets (750-1,500 mg total) by mouth every 6 (six) hours as needed for muscle spasms.  Dispense: 120 tablet; Refill: 5  5. Pain and swelling of knee, left CT of the left knee ordered, Continue methocarbamol  as needed  - methocarbamol  (ROBAXIN ) 750 MG tablet; Take 1-2 tablets (750-1,500 mg total) by mouth every 6 (six) hours as needed for muscle spasms.  Dispense: 120 tablet; Refill: 5 - CT KNEE LEFT WO CONTRAST; Future  6. Hypokalemia Her most recent potassium level was normal at 4.4 when she was in the ED 3 days ago.    General Counseling: Margi verbalizes understanding of the findings of todays visit and agrees with plan of treatment. I have discussed any further diagnostic evaluation that may be needed or ordered today. We also reviewed her medications today. she has been encouraged to call the office with any questions or concerns that should arise related to todays visit.    Orders Placed This Encounter  Procedures   CT KNEE LEFT WO CONTRAST    Meds ordered this encounter  Medications   methocarbamol  (ROBAXIN ) 750 MG tablet    Sig: Take 1-2 tablets (750-1,500 mg total) by mouth every 6 (six) hours as needed for muscle spasms.    Dispense:  120 tablet    Refill:  5    Fill new script today, note increased dose.    Return if symptoms worsen or fail to improve, for keep regular scheduled follow up .   Total time spent:30 Minutes Time spent includes review of chart, medications, test results, and follow up plan with the  patient.   [place] Controlled Substance Database was reviewed by me.  This patient was seen by Mardy Maxin, FNP-C in collaboration with Dr. Sigrid Bathe as a part of collaborative care agreement.   Rollins Wrightson R. Maxin, MSN, FNP-C Internal medicine

## 2024-02-07 ENCOUNTER — Telehealth: Payer: Self-pay | Admitting: Nurse Practitioner

## 2024-02-07 NOTE — Telephone Encounter (Signed)
 Awaiting 02/04/24 office notes for Orthopedic referral-Toni

## 2024-02-08 ENCOUNTER — Ambulatory Visit

## 2024-02-09 ENCOUNTER — Ambulatory Visit
Admission: RE | Admit: 2024-02-09 | Discharge: 2024-02-09 | Disposition: A | Discharge status: Home / Self Care | Source: Ambulatory Visit | Attending: Nurse Practitioner | Admitting: Nurse Practitioner

## 2024-02-09 DIAGNOSIS — M25462 Effusion, left knee: Secondary | ICD-10-CM

## 2024-02-09 DIAGNOSIS — G8929 Other chronic pain: Secondary | ICD-10-CM

## 2024-02-10 ENCOUNTER — Other Ambulatory Visit: Payer: Self-pay | Admitting: Student in an Organized Health Care Education/Training Program

## 2024-02-10 ENCOUNTER — Telehealth: Payer: Self-pay

## 2024-02-10 DIAGNOSIS — J4550 Severe persistent asthma, uncomplicated: Secondary | ICD-10-CM

## 2024-02-10 NOTE — Telephone Encounter (Signed)
 Patient notified

## 2024-02-11 MED ORDER — DUPIXENT 300 MG/2ML ~~LOC~~ SOAJ: 300.0000 mg | SUBCUTANEOUS | 3 refills | Status: AC

## 2024-02-11 MED ORDER — DUPIXENT 300 MG/2ML ~~LOC~~ SOAJ
300.0000 mg | SUBCUTANEOUS | 3 refills | Status: DC
Start: 2024-02-11 — End: 2024-02-11

## 2024-02-11 NOTE — Addendum Note (Signed)
 Addended by: Jaquez Farrington L on: 02/11/2024 11:27 AM   Modules accepted: Orders

## 2024-02-11 NOTE — Telephone Encounter (Signed)
 Rx triaged to correct pharmacy - [facility]

## 2024-02-11 NOTE — Telephone Encounter (Signed)
 Refill sent for DUPIXENT  to [facility]: [phone]   Dose: 300mg   every 14 days  Last OV: 01/12/24 Provider: Dr. Isadora  Next OV: not yet scheduled  Aleck Puls, PharmD, BCPS Clinical Pharmacist  [facility]

## 2024-02-14 ENCOUNTER — Other Ambulatory Visit

## 2024-02-15 ENCOUNTER — Encounter: Admitting: Nurse Practitioner

## 2024-02-19 NOTE — Progress Notes (Signed)
 PROVIDER NOTE: Interpretation of information contained herein should be left to medically-trained personnel. Specific patient instructions are provided elsewhere under Patient Instructions section of medical record. This document was created in part using AI and STT-dictation technology, any transcriptional errors that may result from this process are unintentional.  Patient: Summer Murphy  Service: E/M   PCP: Liana Fish, NP  DOB: Mar 14, 1956  DOS: 02/21/2024  Provider: Emmy MARLA Blanch, NP  MRN: [id number]  Delivery: Face-to-face  Specialty: Interventional Pain Management  Type: Established Patient  Setting: Ambulatory outpatient facility  Specialty designation: 09  Referring Prov.: Liana Fish, NP  Location: Outpatient office facility       HPI  Summer Murphy, a 68 y.o. year old female, is here today because of her Chronic pain of left knee [M25.562, G89.29]. Summer Murphy primary complain today is Knee Pain (Left)  Pertinent problems: Summer Murphy has Severe recurrent major depression without psychotic features [facility]); COPD (chronic obstructive pulmonary disease) (HCC); Barrett esophagus; Chronic asthmatic bronchitis; Foraminal stenosis of cervical region; Cervical disc disorder with radiculopathy of cervical region; Cervicogenic headache; S/P revision of total knee, right; and Cervical radicular pain (left severe) on their pertinent problem list.    Pain Assessment: Severity of Chronic pain is reported as a 8 /10. Location: Knee Left/Radiating up and down the left leg. Onset: More than a month ago. Quality: Constant, Stabbing. Timing: Constant. Modifying factor(s): Pain medication helps. Vitals:  height is 4' 11.5 (1.511 m) and weight is 106 lb (48.1 kg). Her temporal temperature is 97.9 F (36.6 C). Her blood pressure is 111/63 and her pulse is 98. Her respiration is 18 and oxygen  saturation is 97%.  BMI: Estimated body mass index is 21.05 kg/m as calculated from the  following:   Height as of this encounter: 4' 11.5 (1.511 m).   Weight as of this encounter: 106 lb (48.1 kg). Last encounter: 02/01/2024.  Reason for encounter: both, medication management and post-procedure evaluation and assessment. No change in medical history since last visit.  Patient's pain is at baseline.  Patient continues multimodal pain regimen as prescribed.  States that it provides pain relief and improvement in functional status.   Summer Murphy underwent a diagnostic/therapeutic genicular nerve block on January 31, 2024.  She reports no functional improvement and no pain relief since the procedure.   Discussed the use of AI scribe software for clinical note transcription with the patient, who gave verbal consent to proceed.  History of Present Illness   Summer Murphy is a 68 year old female who presents with persistent knee pain and ineffective nerve block treatment.  She experiences persistent knee pain, primarily in the left knee, described as severe with a pain level of 8 out of 10. The pain significantly impairs her ability to walk, and icing the knee does not substantially reduce the swelling.  She has a history of fluid buildup in the knee joint, which she refers to as a joint effusion   Last month, she underwent a genicular nerve block performed by Dr. Marcelino, which did not provide relief. She describes the procedure as painful, stating 'I screamed every time she put in one' and reports that the numbing medication did not alleviate her pain.  Her current medications include hydrocodone  5-325 mg which she takes three times a day, and Tylenol  as needed. She occasionally takes clonazepam  for nerve-related issues, but not on a daily basis.     Procedure:  Procedure Type: Genicular Nerves Block (Superolateral, Superomedial, and Inferomedial Genicular Nerves)  #1  Laterality: Left (-LT)  Level: Superior and inferior to the knee joint.  Imaging: Fluoroscopic  guidance Anesthesia: Local anesthesia (1-2% Lidocaine ) with IV Versed  DOS: 01/31/2024  Performed by: Wallie Sherry, MD   Purpose: Diagnostic/Therapeutic Indications: Chronic knee pain severe enough to impact quality of life or function. Rationale (medical necessity): procedure needed and proper for the diagnosis and/or treatment of Ms. Knudsen medical symptoms and needs. 1. Primary osteoarthritis of left knee   2. Chronic pain of left knee     NAS-11 Pain score:        Pre-procedure: 10-Worst pain ever/10        Post-procedure: 8 /10  Post-Procedure Evaluation    Effectiveness:  Initial hour after procedure: 0 % . Subsequent 4-6 hours post-procedure: 0 % . Analgesia past initial 6 hours: 0 % . Ongoing improvement:  Analgesic:  0% Function: No improvement ROM: No improvement Interpretation: Summer Murphy underwent a diagnostic/therapeutic genicular nerve block on January 31, 2024.  She reports no functional improvement and no pain relief since the procedure.  Pharmacotherapy Assessment   Norco 5-325 mg every 8 hours as needed for pain. MME= 10 Monitoring:  PMP: PDMP reviewed during this encounter.       Pharmacotherapy: No side-effects or adverse reactions reported. Compliance: No problems identified. Effectiveness: Clinically acceptable.  Summer Murphy, [place]  02/21/2024 10:34 AM  Sign when Signing Visit Nursing Pain Medication Assessment:  Safety precautions to be maintained throughout the outpatient stay will include: orient to surroundings, keep bed in low position, maintain call bell within reach at all times, provide assistance with transfer out of bed and ambulation.  Medication Inspection Compliance: Pill count conducted under aseptic conditions, in front of the patient. Neither the pills nor the bottle was removed from the patient's sight at any time. Once count was completed pills were immediately returned to the patient in their original bottle.  Medication:  Hydrocodone /APAP Pill/Patch Count: 0 of 60 pills/patches remain Pill/Patch Appearance: Markings consistent with prescribed medication Bottle Appearance: Standard pharmacy container. Clearly labeled. Filled Date: 99 / 23 / 2025 Last Medication intake:  Yesterday    UDS:  Summary  Date Value Ref Range Status  01/04/2024 FINAL  Final    Comment:    ==================================================================== Compliance Drug Analysis, Ur ==================================================================== Test                             Result       Flag       Units  Drug Present and Declared for Prescription Verification   Hydrocodone                     4070         EXPECTED   ng/mg creat   Dihydrocodeine                 128          EXPECTED   ng/mg creat   Norhydrocodone                 259          EXPECTED   ng/mg creat    A moderate to large amount of hydrocodone  is present; metabolite is    present at very low concentration. This is an atypical result.    Although patients with unusual metabolic profiles exist, they are  rare. Review of previous drug screen results or collection of a    urine sample several hours after a WITNESSED dose of the drug may    help to clarify the subject's ability to produce metabolite.     Sources of hydrocodone  include scheduled prescription medications.    Dihydrocodeine and norhydrocodone are expected metabolites of    hydrocodone . Dihydrocodeine is also available as a scheduled    prescription medication.    Gabapentin                      PRESENT      EXPECTED   Methocarbamol                   PRESENT      EXPECTED   Trazodone                       PRESENT      EXPECTED   1,3 chlorophenyl piperazine    PRESENT      EXPECTED    1,3-chlorophenyl piperazine is an expected metabolite of trazodone .    Duloxetine                      PRESENT      EXPECTED   Acetaminophen                   PRESENT      EXPECTED  Drug Present not Declared  for Prescription Verification   Ibuprofen                       PRESENT      UNEXPECTED   Diphenhydramine                 PRESENT      UNEXPECTED   Diltiazem                       PRESENT      UNEXPECTED  Drug Absent but Declared for Prescription Verification   Clonazepam                      Not Detected UNEXPECTED ng/mg creat   Diclofenac                      Not Detected UNEXPECTED    Diclofenac , as indicated in the declared medication list, is not    always detected even when used as directed.    Lidocaine                       Not Detected UNEXPECTED    Lidocaine , as indicated in the declared medication list, is not    always detected even when used as directed.  ==================================================================== Test                      Result    Flag   Units      Ref Range   Creatinine              159              mg/dL      >=79 ==================================================================== Declared Medications:  The flagging and interpretation on this report are based on the  following declared medications.  Unexpected results may arise from  inaccuracies in the declared medications.   **Note: The testing scope of this panel includes these medications:  Clonazepam  (Klonopin )  Duloxetine  (Cymbalta )  Gabapentin   Hydrocodone   Methocarbamol  (Robaxin )  Trazodone  (Desyrel )   **Note: The testing scope of this panel does not include small to  moderate amounts of these reported medications:   Acetaminophen   Diclofenac  (Voltaren )  Topical Lidocaine  (Lidoderm )   **Note: The testing scope of this panel does not include the  following reported medications:   Albuterol  (Duoneb)  Albuterol  (Combivent )  Alendronate  (Fosamax )  Apixaban  (Eliquis )  Buspirone  (Buspar )  Cefadroxil  (Duricef)  Dupilumab  (Dupixent )  Fluconazole  (Diflucan )  Fluticasone  (Advair)  Ipratropium (Duoneb)  Ipratropium (Combivent )  Montelukast  (Singulair )  Ondansetron  (Zofran )   Oxybutynin  (Ditropan )  Pantoprazole  (Protonix )  Polyethylene Glycol (MiraLAX )  Potassium (Klor-Con )  Ropinirole  (Requip )  Salmeterol (Advair)  Sumatriptan  (Imitrex ) ==================================================================== For clinical consultation, please call [phone]. ====================================================================     No results found for: CBDTHCR No results found for: D8THCCBX No results found for: D9THCCBX  ROS  Constitutional: Denies any fever or chills Gastrointestinal: No reported hemesis, hematochezia, vomiting, or acute GI distress Musculoskeletal: Left knee pain Neurological: No reported episodes of acute onset apraxia, aphasia, dysarthria, agnosia, amnesia, paralysis, loss of coordination, or loss of consciousness  Medication Review  Accu-Chek Guide Me, Accu-Chek Softclix Lancets, DULoxetine , Dupilumab , HYDROcodone -acetaminophen , Ipratropium-Albuterol , SUMAtriptan , acetaminophen , albuterol , alendronate , apixaban , busPIRone , cefadroxil , clonazePAM , diclofenac  Sodium, fluticasone -salmeterol, gabapentin , glucose blood, ipratropium-albuterol , lidocaine , methocarbamol , montelukast , ondansetron , oxybutynin , pantoprazole , polyethylene glycol, potassium chloride , rOPINIRole , and traZODone   History Review  Allergy: Summer Murphy is allergic to librium [chlordiazepoxide], reglan [metoclopramide], vanilla, aspirin, nsaids, azithromycin , buprenorphine hcl, flexeril  [cyclobenzaprine ], morphine, sulfa antibiotics, tolmetin, amlodipine  besylate, and iron . Drug: Summer Murphy  reports no history of drug use. Alcohol:  reports no history of alcohol use. Tobacco:  reports that she has never smoked. She has been exposed to tobacco smoke. She has never used smokeless tobacco. Social: Summer Murphy  reports that she has never smoked. She has been exposed to tobacco smoke. She has never used smokeless tobacco. She reports that she does not drink alcohol  and does not use drugs. Medical:  has a past medical history of Acid reflux, Anemia, Anxiety, Arthritis, Aspiration pneumonia (HCC) (06/02/2022), Asthma, CAP (community acquired pneumonia) (04/24/2022), Cervical disc disorder with radiculopathy of cervical region, Chronic pain syndrome (05/25/2023), Chronic, continuous use of opioids, COPD (chronic obstructive pulmonary disease) (HCC), DOE (dyspnea on exertion), Hematuria, Hiatal hernia, History of kidney stones, Hypertension, Hypertensive urgency (05/12/2022), Hypoglycemia, Insomnia, LBBB (left bundle branch block), Migraine, MSSA bacteremia (04/19/2023), Murmur, Osteopenia, Palpitations, Pre-diabetes, Restless leg, Sepsis (HCC) (05/12/2022), Stenosis of cervical spine with myelopathy (HCC), and T2DM (type 2 diabetes mellitus) (HCC). Surgical: Summer Murphy  has a past surgical history that includes Ureteroscopy; Laparoscopic hysterectomy; Lithotripsy; HH repair; Cardiac electrophysiology study and ablation; Appendectomy (1990); Cholecystectomy (1990); Joint replacement (Bilateral, [id number]); Extracorporeal shock wave lithotripsy (Left, 09/12/2015); Tonsillectomy; Corneal transplant; Abdominal hysterectomy; Breast surgery; Fracture surgery; Amputation toe (Left, 07/31/2016); Excision bone cyst (Left, 07/31/2016); Colonoscopy with propofol  (N/A, 01/04/2017); Esophagogastroduodenoscopy (egd) with propofol  (N/A, 01/04/2017); Back surgery; Total knee revision (Right, 11/14/2019); periprosthetic supracondylar fracture of left femur (02/16/2020); Hardware Removal (Left, 11/14/2020); Esophagogastroduodenoscopy (N/A, 04/09/2021); Gum surgery (Left); Posterior cervical fusion/foraminotomy (N/A, 03/02/2023); Cervical wound debridement (N/A, 04/17/2023); and laparotomy (N/A, 11/27/2023). Family: family history includes Breast cancer in her maternal aunt; Diabetes in her brother; Hypertension in her brother and mother; Kidney Stones in her father; Prostate cancer in her father;  Stroke in her mother.  Laboratory Chemistry Profile   Renal Lab Results  Component Value Date   BUN 23 02/01/2024   CREATININE 0.45  02/01/2024   BCR 17 01/15/2022   GFRAA >60 11/16/2019   GFRNONAA >60 02/01/2024    Hepatic Lab Results  Component Value Date   AST 25 02/01/2024   ALT 18 02/01/2024   ALBUMIN 3.4 (L) 02/01/2024   ALKPHOS 89 02/01/2024   LIPASE 19 11/26/2023    Electrolytes Lab Results  Component Value Date   NA 138 02/01/2024   K 4.4 02/01/2024   CL 102 02/01/2024   CALCIUM 9.1 02/01/2024   MG 1.9 11/30/2023   PHOS 2.9 04/18/2023    Bone Lab Results  Component Value Date   VD25OH 34.34 10/12/2023    Inflammation (CRP: Acute Phase) (ESR: Chronic Phase) Lab Results  Component Value Date   CRP 5.4 (H) 11/17/2023   ESRSEDRATE 63 (H) 11/17/2023   LATICACIDVEN 0.7 11/27/2023         Note: Above Lab results reviewed.  Recent Imaging Review  CT KNEE LEFT WO CONTRAST CLINICAL DATA:  Left knee pain and swelling  EXAM: CT OF THE LEFT KNEE WITHOUT CONTRAST  TECHNIQUE: Multidetector CT imaging of the left knee was performed according to the standard protocol. Multiplanar CT image reconstructions were also generated.  RADIATION DOSE REDUCTION: This exam was performed according to the departmental dose-optimization program which includes automated exposure control, adjustment of the mA and/or kV according to patient size and/or use of iterative reconstruction technique.  COMPARISON:  Left knee x-ray 04/16/2023  FINDINGS: Bones/Joint/Cartilage  Generalized osteopenia.  Left total knee arthroplasty with beam hardening artifact partially obscuring the adjacent soft tissue and osseous structures. No periarticular fluid collection or osteolysis.  No acute fracture or dislocation. Old healed distal femoral metaphyseal fracture with mild posttraumatic deformity. Moderate joint effusion. Normal alignment.  Ligaments  Ligaments are suboptimally  evaluated by CT.  Muscles and Tendons Muscles are normal. No muscle atrophy. No intramuscular fluid collection or hematoma. Distal quadriceps tendinosis. Patellar tendon is obscured by beam hardening artifact.  Soft tissue No fluid collection or hematoma.  No soft tissue mass.  IMPRESSION: 1. Left total knee arthroplasty with beam hardening artifact partially obscuring the adjacent soft tissue and osseous structures. No periarticular fluid collection or osteolysis. 2. Moderate joint effusion. 3. Distal quadriceps tendinosis.  Electronically Signed   By: Julaine Blanch M.D.   On: 02/14/2024 10:10 Note: Reviewed        Physical Exam  Vitals: BP 111/63 (BP Location: Right Arm, Patient Position: Sitting, Cuff Size: Normal)   Pulse 98   Temp 97.9 F (36.6 C) (Temporal)   Resp 18   Ht 4' 11.5 (1.511 m)   Wt 106 lb (48.1 kg)   SpO2 97%   BMI 21.05 kg/m  BMI: Estimated body mass index is 21.05 kg/m as calculated from the following:   Height as of this encounter: 4' 11.5 (1.511 m).   Weight as of this encounter: 106 lb (48.1 kg). Ideal: Patient must be at least 60 in tall to calculate ideal body weight General appearance: Well nourished, well developed, and well hydrated. In no apparent acute distress Mental status: Alert, oriented x 3 (person, place, & time)       Respiratory: No evidence of acute respiratory distress Eyes: PERLA  Musculoskeletal: Left knee pain Assessment   Diagnosis Status  1. Chronic pain of left knee   2. Foraminal stenosis of cervical region (severe b/l C5/6; C6/7)   3. S/P revision of total knee, right   4. Medication management   5. Chronic midline low back pain without  sciatica   6. S/P hardware removal   7. Cervical disc disorder with radiculopathy of cervical region   8. Cervicogenic headache   9. Stenosis of cervical spine with myelopathy (HCC)   10. S/P cervical spinal fusion   11. Chronic pain syndrome     Controlled Controlled Controlled   Plan of Care  Assessment and Plan    Chronic right knee pain with joint effusion and failed genicular nerve block in the setting of right knee arthroplasty Chronic right knee pain persists with high pain level and ambulation difficulty. Genicular nerve block ineffective, insurance requires positive response for radiofrequency ablation approval. Current management includes hydrocodone . - Increased hydrocodone  to three times daily. - Advised against routine Tylenol  use unless necessary. - Coordinated with pharmacy for medication availability.  The patient does not want any further interventional treatment for her left knee.   Chronic pain syndrome Managed with hydrocodone , gabapentin , and clonazepam  (not using daily basis) for nerve pain. - Continue hydrocodone , gabapentin  as needed  Patient's pain is controlled with hydrocodone , will continue on current medication regimen.  Prescribing drug monitoring (PDMP) reviewed, findings consistent with the use of prescribed medication and no evidence of narcotic misuse or abuse.  Urine drug screening (UDS) up to date.  No side effects or adverse reaction reported to medication.  Schedule follow-up in 30 days for medication management.       Pharmacotherapy (Medications Ordered): Meds ordered this encounter  Medications   HYDROcodone -acetaminophen  (NORCO/VICODIN) 5-325 MG tablet    Sig: Take 1 tablet by mouth every 8 (eight) hours as needed for severe pain (pain score 7-10).    Dispense:  90 tablet    Refill:  0    May fill 2 days early because [place] is [place]. (Only for that month)   Orders:  No orders of the defined types were placed in this encounter.  Follow-up plan:   Return in about 1 month (around 03/23/2024) for (F2F), (MM), Emmy Blanch NP.    Recent Visits Date Type Provider Dept  01/31/24 Procedure visit Marcelino Nurse, MD [facility]  01/24/24 Office Visit Fenix Rorke K, NP  [facility]  01/13/24 Office Visit Ayven Pheasant K, NP [facility]  01/04/24 Office Visit Marcelino Nurse, MD [facility]  Showing recent visits within past 90 days and meeting all other requirements Today's Visits Date Type Provider Dept  02/21/24 Office Visit Dewon Mendizabal K, NP [facility]  Showing today's visits and meeting all other requirements Future Appointments Date Type Provider Dept  03/21/24 Appointment Nekia Maxham K, NP [facility]  Showing future appointments within next 90 days and meeting all other requirements  I discussed the assessment and treatment plan with the patient. The patient was provided an opportunity to ask questions and all were answered. The patient agreed with the plan and demonstrated an understanding of the instructions.  Patient advised to call back or seek an in-person evaluation if the symptoms or condition worsens.  I personally spent a total of 30 minutes in the care of the patient today including preparing to see the patient, getting/reviewing separately obtained history, performing a medically appropriate exam/evaluation, counseling and educating, placing orders, referring and communicating with other health care professionals, documenting clinical information in the EHR, independently interpreting results, communicating results, and coordinating care.   Note by: Carlen Rebuck K Mili Piltz, NP (TTS and AI technology used. I apologize for any typographical errors that were not detected and corrected.) Date:  02/21/2024; Time: 12:22 PM

## 2024-02-21 ENCOUNTER — Ambulatory Visit: Attending: Nurse Practitioner | Admitting: Nurse Practitioner

## 2024-02-21 ENCOUNTER — Encounter: Payer: Self-pay | Admitting: Nurse Practitioner

## 2024-02-21 VITALS — BP 111/63 | HR 98 | Temp 97.9°F | Resp 18 | Ht 59.5 in | Wt 106.0 lb

## 2024-02-21 DIAGNOSIS — G992 Myelopathy in diseases classified elsewhere: Secondary | ICD-10-CM | POA: Insufficient documentation

## 2024-02-21 DIAGNOSIS — M4802 Spinal stenosis, cervical region: Secondary | ICD-10-CM | POA: Insufficient documentation

## 2024-02-21 DIAGNOSIS — Z981 Arthrodesis status: Secondary | ICD-10-CM | POA: Diagnosis present

## 2024-02-21 DIAGNOSIS — G8929 Other chronic pain: Secondary | ICD-10-CM | POA: Insufficient documentation

## 2024-02-21 DIAGNOSIS — Z96651 Presence of right artificial knee joint: Secondary | ICD-10-CM | POA: Insufficient documentation

## 2024-02-21 DIAGNOSIS — G894 Chronic pain syndrome: Secondary | ICD-10-CM | POA: Diagnosis present

## 2024-02-21 DIAGNOSIS — M5412 Radiculopathy, cervical region: Secondary | ICD-10-CM

## 2024-02-21 DIAGNOSIS — Z79899 Other long term (current) drug therapy: Secondary | ICD-10-CM | POA: Diagnosis present

## 2024-02-21 DIAGNOSIS — M25562 Pain in left knee: Secondary | ICD-10-CM | POA: Diagnosis present

## 2024-02-21 DIAGNOSIS — M545 Low back pain, unspecified: Secondary | ICD-10-CM | POA: Diagnosis not present

## 2024-02-21 DIAGNOSIS — Z9889 Other specified postprocedural states: Secondary | ICD-10-CM | POA: Insufficient documentation

## 2024-02-21 DIAGNOSIS — E1169 Type 2 diabetes mellitus with other specified complication: Secondary | ICD-10-CM

## 2024-02-21 DIAGNOSIS — M5010 Cervical disc disorder with radiculopathy, unspecified cervical region: Secondary | ICD-10-CM | POA: Diagnosis not present

## 2024-02-21 DIAGNOSIS — G4486 Cervicogenic headache: Secondary | ICD-10-CM | POA: Insufficient documentation

## 2024-02-21 DIAGNOSIS — G959 Disease of spinal cord, unspecified: Secondary | ICD-10-CM

## 2024-02-21 MED ORDER — HYDROCODONE-ACETAMINOPHEN 5-325 MG PO TABS: 1.0000 | ORAL_TABLET | Freq: Three times a day (TID) | ORAL | 0 refills | Status: DC | PRN

## 2024-02-21 NOTE — Progress Notes (Signed)
 Nursing Pain Medication Assessment:  Safety precautions to be maintained throughout the outpatient stay will include: orient to surroundings, keep bed in low position, maintain call bell within reach at all times, provide assistance with transfer out of bed and ambulation.  Medication Inspection Compliance: Pill count conducted under aseptic conditions, in front of the patient. Neither the pills nor the bottle was removed from the patient's sight at any time. Once count was completed pills were immediately returned to the patient in their original bottle.  Medication: Hydrocodone /APAP Pill/Patch Count: 0 of 60 pills/patches remain Pill/Patch Appearance: Markings consistent with prescribed medication Bottle Appearance: Standard pharmacy container. Clearly labeled. Filled Date: 61 / 23 / 2025 Last Medication intake:  Yesterday

## 2024-02-23 ENCOUNTER — Encounter: Payer: Self-pay | Admitting: Nurse Practitioner

## 2024-02-23 ENCOUNTER — Ambulatory Visit (INDEPENDENT_AMBULATORY_CARE_PROVIDER_SITE_OTHER): Admitting: Nurse Practitioner

## 2024-02-23 VITALS — BP 129/80 | HR 99 | Temp 98.2°F | Resp 16 | Ht 59.5 in | Wt 105.6 lb

## 2024-02-23 DIAGNOSIS — E876 Hypokalemia: Secondary | ICD-10-CM

## 2024-02-23 DIAGNOSIS — G8929 Other chronic pain: Secondary | ICD-10-CM

## 2024-02-23 DIAGNOSIS — R319 Hematuria, unspecified: Secondary | ICD-10-CM

## 2024-02-23 DIAGNOSIS — N390 Urinary tract infection, site not specified: Secondary | ICD-10-CM

## 2024-02-23 DIAGNOSIS — M25562 Pain in left knee: Secondary | ICD-10-CM | POA: Diagnosis not present

## 2024-02-23 DIAGNOSIS — M4802 Spinal stenosis, cervical region: Secondary | ICD-10-CM | POA: Diagnosis not present

## 2024-02-23 NOTE — Progress Notes (Signed)
 [facility] [address] [place], [place] [address]  Internal MEDICINE  Office Visit Note  Patient Name: Summer Murphy  [id number]  [id number]  Date of Service: 02/23/2024  Chief Complaint  Patient presents with   Diabetes   Gastroesophageal Reflux   Hypertension   Follow-up    22 day f/u ct results    HPI Bryna presents for a follow-up visit for Ct scan results.  Had a uti, was treated and is now feeling better CT scan of left knee -- has a moderate effusion and further signs of degenerative changes and inflammation as well as distal quadriceps tendinosis. Recently seen by Dr. Kathlynn, she will start physical therapy again. She said he will not do any more surgery until she gains more weight. But her BMI is normal at this time.  Low potassium -- last level was normal Chronic neck pain -- takes pain medication and is followed by neurosurgery and infectious disease.     Current Medication: Outpatient Encounter Medications as of 02/23/2024  Medication Sig Note   Accu-Chek Softclix Lancets lancets USE TO TEST BLOOD SUGAR EVERY DAY AS NEEDED FOR type 2 diabetes    acetaminophen  (TYLENOL ) 500 MG tablet Take 2 tablets (1,000 mg total) by mouth every 6 (six) hours as needed. 04/22/2023: prn   albuterol  (VENTOLIN  HFA) 108 (90 Base) MCG/ACT inhaler Inhale 2 puffs into the lungs every 6 (six) hours as needed for wheezing or shortness of breath.    alendronate  (FOSAMAX ) 70 MG tablet TAKE 1 TABLET BY MOUTH WEEKLY  TAKE WITH A FULL GLASS OF WATER   ON AN EMPTY STOMACH    apixaban  (ELIQUIS ) 5 MG TABS tablet Take 1 tablet (5 mg total) by mouth 2 (two) times daily.    Blood Glucose Monitoring Suppl (ACCU-CHEK GUIDE ME) w/Device KIT Use device to check glucose daily and as need for prediabetes R73.03    busPIRone  (BUSPAR ) 10 MG tablet Take 1 tablet (10 mg total) by mouth 2 (two) times daily.    cefadroxil  (DURICEF) 500 MG capsule Take 1 capsule (500 mg total) by mouth 2 (two) times  daily.    clonazePAM  (KLONOPIN ) 1 MG tablet Take 0.5-1 tablets (0.5-1 mg total) by mouth 2 (two) times daily as needed for anxiety.    COMBIVENT  RESPIMAT 20-100 MCG/ACT AERS respimat 1 puff every 6 (six) hours.    diclofenac  Sodium (VOLTAREN ) 1 % GEL Apply 4 g topically 4 (four) times daily.    DULoxetine  (CYMBALTA ) 60 MG capsule Take 1 capsule (60 mg total) by mouth daily.    Dupilumab  (DUPIXENT ) 300 MG/2ML SOAJ Inject 300 mg into the skin every 14 (fourteen) days.    fluticasone -salmeterol (WIXELA INHUB) 250-50 MCG/ACT AEPB Inhale 1 puff into the lungs in the morning and at bedtime.    gabapentin  (NEURONTIN ) 300 MG capsule Take 1 capsule (300 mg total) by mouth 2 (two) times daily.    glucose blood (ACCU-CHEK GUIDE TEST) test strip TEST BLOOD SUGAR ONCE DAILY AND AS NEEDED FOR type 2 diabetes    [START ON 03/13/2024] HYDROcodone -acetaminophen  (NORCO/VICODIN) 5-325 MG tablet Take 1 tablet by mouth every 8 (eight) hours as needed for severe pain (pain score 7-10).    ipratropium-albuterol  (DUONEB) 0.5-2.5 (3) MG/3ML SOLN Take 3 mLs by nebulization every 6 (six) hours as needed.    lidocaine  (LIDODERM ) 5 % PLACE 1 PATCH ONTO THE AFFECTED AREA OF THE NECK DAILY, REMOVE AND DISCARD WITHIN 12 HOURS OR AS DIRECTED    methocarbamol  (ROBAXIN ) 750  MG tablet Take 1-2 tablets (750-1,500 mg total) by mouth every 6 (six) hours as needed for muscle spasms.    montelukast  (SINGULAIR ) 10 MG tablet TAKE 1 TABLET(10 MG) BY MOUTH AT BEDTIME    ondansetron  (ZOFRAN ) 4 MG tablet TAKE 1 TABLET BY MOUTH TWICE DAILY AS NEEDED    oxybutynin  (DITROPAN -XL) 10 MG 24 hr tablet TAKE 1 TABLET(10 MG) BY MOUTH AT BEDTIME    pantoprazole  (PROTONIX ) 40 MG tablet Take 1 tablet (40 mg total) by mouth 2 (two) times daily.    polyethylene glycol (MIRALAX  / GLYCOLAX ) 17 g packet Take 17 g by mouth 2 (two) times daily.    potassium chloride  (KLOR-CON  10) 10 MEQ tablet Take 1 tablet (10 mEq total) by mouth daily.    rOPINIRole  (REQUIP ) 1  MG tablet TAKE 1 TABLET(1 MG) BY MOUTH AT BEDTIME    SUMAtriptan  (IMITREX ) 100 MG tablet Take 1 tablet (100 mg total) by mouth every 2 (two) hours as needed (for migraine headaches.). May repeat in 1 hours if headache persists or recurs.    traZODone  (DESYREL ) 150 MG tablet TAKE 1 TABLET(150 MG) BY MOUTH AT BEDTIME    No facility-administered encounter medications on file as of 02/23/2024.    Surgical History: Past Surgical History:  Procedure Laterality Date   ABDOMINAL HYSTERECTOMY     AMPUTATION TOE Left 07/31/2016   Procedure: AMPUTATION TOE/MPJ 2nd toe;  Surgeon: Neill Boas, DPM;  Location: [facility] ORS;  Service: Podiatry;  Laterality: Left;   APPENDECTOMY  1990   BACK SURGERY     low back   BREAST SURGERY     bilateral breast reduction   CARDIAC ELECTROPHYSIOLOGY STUDY AND ABLATION     CERVICAL WOUND DEBRIDEMENT N/A 04/17/2023   Procedure: CERVICAL WOUND DEBRIDEMENT;  Surgeon: Bluford Standing, MD;  Location: [facility];  Service: Neurosurgery;  Laterality: N/A;   CHOLECYSTECTOMY  1990   COLONOSCOPY WITH PROPOFOL  N/A 01/04/2017   Procedure: COLONOSCOPY WITH PROPOFOL ;  Surgeon: Viktoria Lamar DASEN, MD;  Location: [facility];  Service: Endoscopy;  Laterality: N/A;   CORNEAL TRANSPLANT     ESOPHAGOGASTRODUODENOSCOPY N/A 04/09/2021   Procedure: ESOPHAGOGASTRODUODENOSCOPY (EGD);  Surgeon: Unk Corinn Skiff, MD;  Location[facility] ENDOSCOPY;  Service: Gastroenterology;  Laterality: N/A;   ESOPHAGOGASTRODUODENOSCOPY (EGD) WITH PROPOFOL  N/A 01/04/2017   Procedure: ESOPHAGOGASTRODUODENOSCOPY (EGD) WITH PROPOFOL ;  Surgeon: Viktoria Lamar DASEN, MD;  Location: [facility] ENDOSCOPY;  Service: Endoscopy;  Laterality: N/A;   EXCISION BONE CYST Left 07/31/2016   Procedure: EXCISION BONE CYST/exostectomy 28124/left 2nd;  Surgeon: Neill Boas, DPM;  Location: [facility] ORS;  Service: Podiatry;  Laterality: Left;   EXTRACORPOREAL SHOCK WAVE LITHOTRIPSY Left 09/12/2015   Procedure: EXTRACORPOREAL SHOCK WAVE LITHOTRIPSY  (ESWL);  Surgeon: Rosina Riis, MD;  Location: [facility] OR[facility];  Service: Urology;  Laterality: Left;   FRACTURE SURGERY     left foot   GUM SURGERY Left    gum infection   HARDWARE REMOVAL Left 11/14/2020   Procedure: HARDWARE REMOVAL;  Surgeon: Kathlynn Sharper, MD;  Location: [facility];  Service: Orthopedics;  Laterality: Left;   HH repair     Fundoplication   JOINT REPLACEMENT Bilateral 2013,2014   total knees   LAPAROSCOPIC HYSTERECTOMY     LAPAROTOMY N/A 11/27/2023   Procedure: LAPAROTOMY, EXPLORATORY lysis of adhesions;  Surgeon: Lane Shope, MD;  Location: [facility] OR[facility];  Service: General;  Laterality: N/A;   LITHOTRIPSY     periprosthetic supracondylar fracture of left femur  02/16/2020   [facility]   POSTERIOR CERVICAL FUSION/FORAMINOTOMY  N/A 03/02/2023   Procedure: C3-6 POSTERIOR CERVICAL LAMINECTOMY AND FUSION;  Surgeon: Claudene Penne ORN, MD;  Location: [facility];  Service: Neurosurgery;  Laterality: N/A;   TONSILLECTOMY     TOTAL KNEE REVISION Right 11/14/2019   Procedure: Revision patella and tibial polyethylene;  Surgeon: Kathlynn Sharper, MD;  Location: [facility] OR[facility];  Service: Orthopedics;  Laterality: Right;   URETEROSCOPY      Medical History: Past Medical History:  Diagnosis Date   Acid reflux    Anemia    Anxiety    a.) buspirone  BID + BZO PRN (clonazepam )   Arthritis    Aspiration pneumonia (HCC) 06/02/2022   Asthma    CAP (community acquired pneumonia) 04/24/2022   Cervical disc disorder with radiculopathy of cervical region    Chronic pain syndrome 05/25/2023   a.) on COT as prescribed by pain mangement   Chronic, continuous use of opioids    a.) followed by pain mangement   COPD (chronic obstructive pulmonary disease) (HCC)    DOE (dyspnea on exertion)    Hematuria    Hiatal hernia    History of kidney stones    Hypertension    Hypertensive urgency 05/12/2022   Hypoglycemia    Insomnia    a.) uses Trazodone  PRN   LBBB (left bundle branch block)    Migraine     MSSA bacteremia 04/19/2023   Murmur    Osteopenia    Palpitations    Pre-diabetes    Restless leg    a.) on ropinirole    Sepsis (HCC) 05/12/2022   Stenosis of cervical spine with myelopathy (HCC)    T2DM (type 2 diabetes mellitus) (HCC)     Family History: Family History  Problem Relation Age of Onset   Hypertension Mother    Stroke Mother    Prostate cancer Father    Kidney Stones Father    Diabetes Brother    Hypertension Brother    Breast cancer Maternal Aunt    Kidney disease Neg Hx     Social History   Socioeconomic History   Marital status: Widowed    Spouse name: Not on file   Number of children: Not on file   Years of education: Not on file   Highest education level: Not on file  Occupational History   Not on file  Tobacco Use   Smoking status: Never    Passive exposure: Past   Smokeless tobacco: Never  Vaping Use   Vaping status: Never Used  Substance and Sexual Activity   Alcohol use: No   Drug use: No   Sexual activity: Not Currently  Other Topics Concern   Not on file  Social History Narrative   Lives at home alone. Relatives are the support person.    Social Drivers of Corporate Investment Banker Strain: Low Risk  (02/23/2024)   Received from [facility]   Overall Financial Resource Strain (CARDIA)    Difficulty of Paying Living Expenses: Not hard at all  Food Insecurity: No Food Insecurity (02/23/2024)   Received from [facility]   Hunger Vital Sign    Within the past 12 months, you worried that your food would run out before you got the money to buy more.: Never true    Within the past 12 months, the food you bought just didn't last and you didn't have money to get more.: Never true  Transportation Needs: No Transportation Needs (02/23/2024)   [facility]   PRAPARE - Transportation    In the past 12 months, has lack of transportation kept you from medical appointments or  from getting medications?: No    Lack of Transportation (Non-Medical): No  Physical Activity: Not on file  Stress: Not on file  Social Connections: Moderately Integrated (11/26/2023)   Social Connection and Isolation Panel    Frequency of Communication with Friends and Family: More than three times a week    Frequency of Social Gatherings with Friends and Family: More than three times a week    Attends Religious Services: 1 to 4 times per year    Active Member of [employer] or Organizations: Yes    Attends Banker Meetings: 1 to 4 times per year    Marital Status: Widowed  Intimate Partner Violence: Not At Risk (11/26/2023)   Humiliation, Afraid, Rape, and Kick questionnaire    Fear of Current or Ex-Partner: No    Emotionally Abused: No    Physically Abused: No    Sexually Abused: No      Review of Systems  Constitutional:  Positive for appetite change, fatigue and unexpected weight change. Negative for fever.  HENT:  Negative for congestion, mouth sores and postnasal drip.   Respiratory: Negative.  Negative for cough, chest tightness, shortness of breath and wheezing.   Cardiovascular: Negative.  Negative for chest pain and palpitations.  Genitourinary:  Negative for flank pain.  Musculoskeletal:  Positive for arthralgias, back pain and neck pain.  Neurological:        Forgetfulness  Psychiatric/Behavioral:  Positive for behavioral problems and sleep disturbance. Negative for self-injury and suicidal ideas. The patient is nervous/anxious.     Vital Signs: BP (!) 129/90   Pulse 99   Temp 98.2 F (36.8 C)   Resp 16   Ht 4' 11.5 (1.511 m)   Wt 105 lb 9.6 oz (47.9 kg)   SpO2 95%   BMI 20.97 kg/m    Physical Exam Vitals reviewed.  Constitutional:      General: She is not in acute distress.    Appearance: Normal appearance. She is normal weight. She is not ill-appearing.  HENT:     Head: Normocephalic and atraumatic.  Eyes:     Pupils: Pupils are equal, round,  and reactive to light.  Cardiovascular:     Rate and Rhythm: Normal rate and regular rhythm.  Pulmonary:     Effort: Pulmonary effort is normal. No respiratory distress.  Musculoskeletal:     Cervical back: Pain with movement present. Decreased range of motion.  Neurological:     Mental Status: She is alert and oriented to person, place, and time.  Psychiatric:        Mood and Affect: Mood normal.        Behavior: Behavior normal.        Assessment/Plan: 1. Chronic pain of left knee (Primary) Follow up with Dr. Kathlynn at [facility] clinic orthopedic.   2. Spinal stenosis in cervical region Continue medications as prescribed and follow up with neurosurgery.   3. Urinary tract infection with hematuria, site unspecified Improved and resolved.   4. Hypokalemia Stable at this time, will repeat level in another month or 2.    General Counseling: Kadisha verbalizes understanding of the findings of todays visit and agrees with plan of treatment. I have discussed any further diagnostic evaluation that may be needed or ordered today. We also reviewed her medications today. she has been encouraged to call  the office with any questions or concerns that should arise related to todays visit.    No orders of the defined types were placed in this encounter.   No orders of the defined types were placed in this encounter.   Return in about 5 weeks (around 03/29/2024) for F/U, Ketrick Matney PCP.   Total time spent:30 Minutes Time spent includes review of chart, medications, test results, and follow up plan with the patient.   [place] Controlled Substance Database was reviewed by me.  This patient was seen by Mardy Maxin, FNP-C in collaboration with Dr. Sigrid Bathe as a part of collaborative care agreement.   Waniya Hoglund R. Maxin, MSN, FNP-C Internal medicine

## 2024-03-06 ENCOUNTER — Telehealth: Payer: Self-pay | Admitting: Nurse Practitioner

## 2024-03-06 NOTE — Telephone Encounter (Signed)
 Orthopedic referral cancelled. Patient decided to stay with current orthopedist per Alyssa-Toni

## 2024-03-09 ENCOUNTER — Telehealth: Payer: Self-pay

## 2024-03-10 ENCOUNTER — Other Ambulatory Visit: Payer: Self-pay | Admitting: Infectious Diseases

## 2024-03-10 ENCOUNTER — Other Ambulatory Visit: Payer: Self-pay | Admitting: Nurse Practitioner

## 2024-03-10 DIAGNOSIS — Z760 Encounter for issue of repeat prescription: Secondary | ICD-10-CM

## 2024-03-13 ENCOUNTER — Encounter: Payer: Self-pay | Admitting: Nurse Practitioner

## 2024-03-13 NOTE — Telephone Encounter (Signed)
 Patient notified

## 2024-03-14 NOTE — Progress Notes (Signed)
 Given fall and risk for bleeding coags including aptt ordered to evaluate for bleeding risk

## 2024-03-15 ENCOUNTER — Other Ambulatory Visit: Payer: Self-pay | Admitting: Nurse Practitioner

## 2024-03-15 DIAGNOSIS — M4802 Spinal stenosis, cervical region: Secondary | ICD-10-CM

## 2024-03-15 DIAGNOSIS — M5412 Radiculopathy, cervical region: Secondary | ICD-10-CM

## 2024-03-15 DIAGNOSIS — M25462 Effusion, left knee: Secondary | ICD-10-CM

## 2024-03-19 NOTE — Progress Notes (Unsigned)
 PROVIDER NOTE: Interpretation of information contained herein should be left to medically-trained personnel. Specific patient instructions are provided elsewhere under Patient Instructions section of medical record. This document was created in part using AI and STT-dictation technology, any transcriptional errors that may result from this process are unintentional.  Patient: Summer Murphy  Service: E/M   PCP: Liana Fish, NP  DOB: 07/01/1955  DOS: 03/21/2024  Provider: Emmy MARLA Blanch, NP  MRN: [id number]  Delivery: Face-to-face  Specialty: Interventional Pain Management  Type: Established Patient  Setting: Ambulatory outpatient facility  Specialty designation: 09  Referring Prov.: Liana Fish, NP  Location: Outpatient office facility       History of present illness (HPI) Summer Murphy, a 68 y.o. year old female, is here today because of her No primary diagnosis found.. Summer Murphy primary complain today is No chief complaint on file.  Pertinent problems: Summer Murphy has COPD (chronic obstructive pulmonary disease) (HCC); Barrett esophagus; Polyneuropathy associated with underlying disease; Midline low back pain without sciatica; Diastolic dysfunction; Restless leg; S/P revision of total knee, right; S/P hardware removal; Esophageal dysphagia; Benign esophageal stricture; DOE (dyspnea on exertion); Asthma; Foraminal stenosis of cervical region; Cervical disc disorder with radiculopathy of cervical region; Cervicogenic headache; Cervical radicular pain (left severe); Stenosis of cervical spine with myelopathy (HCC); T2DM (type 2 diabetes mellitus) (HCC); Cervical myelopathy (HCC); S/P cervical spinal fusion; Chronic pain syndrome; Falls; Surgical site infection; Vertebral osteomyelitis (HCC); Lumbar discitis; Infection of cervical spine (HCC); Age-related osteoporosis without current pathological fracture; Small bowel obstruction (HCC); Internal hernia; Hx of long-term (current)  use of anticoagulants; Chronic pain of left knee; Gastroesophageal reflux disease with esophagitis without hemorrhage; Barrett's esophagus without dysplasia; and Periprosthetic supracondylar fracture of femur on their pertinent problem list.  Pain Assessment: Severity of   is reported as a  /10. Location:    / . Onset:  . Quality:  . Timing:  . Modifying factor(s):  SABRA Vitals:  vitals were not taken for this visit.  BMI: Estimated body mass index is 20.97 kg/m as calculated from the following:   Height as of 02/23/24: 4' 11.5 (1.511 m).   Weight as of 02/23/24: 105 lb 9.6 oz (47.9 kg).  Last encounter: 02/21/2024. Last procedure: Visit date not found.  Reason for encounter: {Blank single:19197::medication management,post-procedure evaluation and assessment,both, medication management and post-procedure evaluation and assessment,evaluation of worsening, or previously known (established) problem,patient-requested evaluation,follow-up evaluation,evaluation for possible interventional PM therapy/treatment,evaluation of new problem, *** }.   Discussed the use of AI scribe software for clinical note transcription with the patient, who gave verbal consent to proceed.  History of Present Illness           Pharmacotherapy Assessment   Norco 5-325 mg every 8 hours as needed for pain. MME= 10 Monitoring: [place] PMP: PDMP reviewed during this encounter.       Pharmacotherapy: No side-effects or adverse reactions reported. Compliance: No problems identified. Effectiveness: Clinically acceptable.  Erlene Doyal [place]UN[place], [place]  02/21/2024 10:34 AM  Sign when Signing Visit Nursing Pain Medication Assessment:  Safety precautions to be maintained throughout the outpatient stay will include: orient to surroundings, keep bed in low position, maintain call bell within reach at all times, provide assistance with transfer out of bed and ambulation.  Medication Inspection Compliance: Pill count conducted  under aseptic conditions, in front of the patient. Neither the pills nor the bottle was removed from the patient's sight at any time. Once count was completed pills were immediately  returned to the patient in their original bottle.  Medication: Hydrocodone /APAP Pill/Patch Count: 0 of 60 pills/patches remain Pill/Patch Appearance: Markings consistent with prescribed medication Bottle Appearance: Standard pharmacy container. Clearly labeled. Filled Date: 64 / 23 / 2025 Last Medication intake:  Yesterday    UDS:  Summary  Date Value Ref Range Status  01/04/2024 FINAL  Final    Comment:    ==================================================================== Compliance Drug Analysis, Ur ==================================================================== Test                             Result       Flag       Units  Drug Present and Declared for Prescription Verification   Hydrocodone                     4070         EXPECTED   ng/mg creat   Dihydrocodeine                 128          EXPECTED   ng/mg creat   Norhydrocodone                 259          EXPECTED   ng/mg creat    A moderate to large amount of hydrocodone  is present; metabolite is    present at very low concentration. This is an atypical result.    Although patients with unusual metabolic profiles exist, they are    rare. Review of previous drug screen results or collection of a    urine sample several hours after a WITNESSED dose of the drug may    help to clarify the subject's ability to produce metabolite.     Sources of hydrocodone  include scheduled prescription medications.    Dihydrocodeine and norhydrocodone are expected metabolites of    hydrocodone . Dihydrocodeine is also available as a scheduled    prescription medication.    Gabapentin                      PRESENT      EXPECTED   Methocarbamol                   PRESENT      EXPECTED   Trazodone                       PRESENT      EXPECTED   1,3 chlorophenyl  piperazine    PRESENT      EXPECTED    1,3-chlorophenyl piperazine is an expected metabolite of trazodone .    Duloxetine                      PRESENT      EXPECTED   Acetaminophen                   PRESENT      EXPECTED  Drug Present not Declared for Prescription Verification   Ibuprofen                       PRESENT      UNEXPECTED   Diphenhydramine                 PRESENT      UNEXPECTED   Diltiazem   PRESENT      UNEXPECTED  Drug Absent but Declared for Prescription Verification   Clonazepam                      Not Detected UNEXPECTED ng/mg creat   Diclofenac                      Not Detected UNEXPECTED    Diclofenac , as indicated in the declared medication list, is not    always detected even when used as directed.    Lidocaine                       Not Detected UNEXPECTED    Lidocaine , as indicated in the declared medication list, is not    always detected even when used as directed.  ==================================================================== Test                      Result    Flag   Units      Ref Range   Creatinine              159              mg/dL      >=79 ==================================================================== Declared Medications:  The flagging and interpretation on this report are based on the  following declared medications.  Unexpected results may arise from  inaccuracies in the declared medications.   **Note: The testing scope of this panel includes these medications:   Clonazepam  (Klonopin )  Duloxetine  (Cymbalta )  Gabapentin   Hydrocodone   Methocarbamol  (Robaxin )  Trazodone  (Desyrel )   **Note: The testing scope of this panel does not include small to  moderate amounts of these reported medications:   Acetaminophen   Diclofenac  (Voltaren )  Topical Lidocaine  (Lidoderm )   **Note: The testing scope of this panel does not include the  following reported medications:   Albuterol  (Duoneb)  Albuterol  (Combivent )   Alendronate  (Fosamax )  Apixaban  (Eliquis )  Buspirone  (Buspar )  Cefadroxil  (Duricef)  Dupilumab  (Dupixent )  Fluconazole  (Diflucan )  Fluticasone  (Advair)  Ipratropium (Duoneb)  Ipratropium (Combivent )  Montelukast  (Singulair )  Ondansetron  (Zofran )  Oxybutynin  (Ditropan )  Pantoprazole  (Protonix )  Polyethylene Glycol (MiraLAX )  Potassium (Klor-Con )  Ropinirole  (Requip )  Salmeterol (Advair)  Sumatriptan  (Imitrex ) ==================================================================== For clinical consultation, please call [phone]. ====================================================================     No results found for: CBDTHCR No results found for: D8THCCBX No results found for: D9THCCBX  ROS  Constitutional: Denies any fever or chills Gastrointestinal: No reported hemesis, hematochezia, vomiting, or acute GI distress Musculoskeletal: Left knee pain Neurological: No reported episodes of acute onset apraxia, aphasia, dysarthria, agnosia, amnesia, paralysis, loss of coordination, or loss of consciousness  Medication Review  Accu-Chek Guide Me, Accu-Chek Softclix Lancets, DULoxetine , Dupilumab , HYDROcodone -acetaminophen , Ipratropium-Albuterol , SUMAtriptan , acetaminophen , albuterol , alendronate , apixaban , busPIRone , cefadroxil , clonazePAM , diclofenac  Sodium, fluticasone -salmeterol, gabapentin , glucose blood, ipratropium-albuterol , lidocaine , methocarbamol , montelukast , ondansetron , oxybutynin , pantoprazole , polyethylene glycol, potassium chloride , rOPINIRole , and traZODone   History Review  Allergy: Summer Murphy is allergic to librium [chlordiazepoxide], reglan [metoclopramide], vanilla, aspirin, nsaids, azithromycin , buprenorphine hcl, flexeril  [cyclobenzaprine ], morphine, sulfa antibiotics, tolmetin, amlodipine  besylate, and iron . Drug: Summer Murphy  reports no history of drug use. Alcohol:  reports no history of alcohol use. Tobacco:  reports that she has never  smoked. She has been exposed to tobacco smoke. She has never used smokeless tobacco. Social: Summer Murphy  reports that she has never smoked. She has been exposed to tobacco smoke. She has never used smokeless tobacco. She reports that she  does not drink alcohol and does not use drugs. Medical:  has a past medical history of Acid reflux, Anemia, Anxiety, Arthritis, Aspiration pneumonia (HCC) (06/02/2022), Asthma, CAP (community acquired pneumonia) (04/24/2022), Cervical disc disorder with radiculopathy of cervical region, Chronic pain syndrome (05/25/2023), Chronic, continuous use of opioids, COPD (chronic obstructive pulmonary disease) (HCC), DOE (dyspnea on exertion), Hematuria, Hiatal hernia, History of kidney stones, Hypertension, Hypertensive urgency (05/12/2022), Hypoglycemia, Insomnia, LBBB (left bundle branch block), Migraine, MSSA bacteremia (04/19/2023), Murmur, Osteopenia, Palpitations, Pre-diabetes, Restless leg, Sepsis (HCC) (05/12/2022), Stenosis of cervical spine with myelopathy (HCC), and T2DM (type 2 diabetes mellitus) (HCC). Surgical: Ms. Brass  has a past surgical history that includes Ureteroscopy; Laparoscopic hysterectomy; Lithotripsy; HH repair; Cardiac electrophysiology study and ablation; Appendectomy (1990); Cholecystectomy (1990); Joint replacement (Bilateral, [id number]); Extracorporeal shock wave lithotripsy (Left, 09/12/2015); Tonsillectomy; Corneal transplant; Abdominal hysterectomy; Breast surgery; Fracture surgery; Amputation toe (Left, 07/31/2016); Excision bone cyst (Left, 07/31/2016); Colonoscopy with propofol  (N/A, 01/04/2017); Esophagogastroduodenoscopy (egd) with propofol  (N/A, 01/04/2017); Back surgery; Total knee revision (Right, 11/14/2019); periprosthetic supracondylar fracture of left femur (02/16/2020); Hardware Removal (Left, 11/14/2020); Esophagogastroduodenoscopy (N/A, 04/09/2021); Gum surgery (Left); Posterior cervical fusion/foraminotomy (N/A, 03/02/2023);  Cervical wound debridement (N/A, 04/17/2023); and laparotomy (N/A, 11/27/2023). Family: family history includes Breast cancer in her maternal aunt; Diabetes in her brother; Hypertension in her brother and mother; Kidney Stones in her father; Prostate cancer in her father; Stroke in her mother.  Laboratory Chemistry Profile   Renal Lab Results  Component Value Date   BUN 23 02/01/2024   CREATININE 0.45 02/01/2024   BCR 17 01/15/2022   GFRAA >60 11/16/2019   GFRNONAA >60 02/01/2024    Hepatic Lab Results  Component Value Date   AST 25 02/01/2024   ALT 18 02/01/2024   ALBUMIN 3.4 (L) 02/01/2024   ALKPHOS 89 02/01/2024   LIPASE 19 11/26/2023    Electrolytes Lab Results  Component Value Date   NA 138 02/01/2024   K 4.4 02/01/2024   CL 102 02/01/2024   CALCIUM 9.1 02/01/2024   MG 1.9 11/30/2023   PHOS 2.9 04/18/2023    Bone Lab Results  Component Value Date   VD25OH 34.34 10/12/2023    Inflammation (CRP: Acute Phase) (ESR: Chronic Phase) Lab Results  Component Value Date   CRP 5.4 (H) 11/17/2023   ESRSEDRATE 63 (H) 11/17/2023   LATICACIDVEN 0.7 11/27/2023         Note: Above Lab results reviewed.  Recent Imaging Review  CT KNEE LEFT WO CONTRAST CLINICAL DATA:  Left knee pain and swelling  EXAM: CT OF THE LEFT KNEE WITHOUT CONTRAST  TECHNIQUE: Multidetector CT imaging of the left knee was performed according to the standard protocol. Multiplanar CT image reconstructions were also generated.  RADIATION DOSE REDUCTION: This exam was performed according to the departmental dose-optimization program which includes automated exposure control, adjustment of the mA and/or kV according to patient size and/or use of iterative reconstruction technique.  COMPARISON:  Left knee x-ray 04/16/2023  FINDINGS: Bones/Joint/Cartilage  Generalized osteopenia.  Left total knee arthroplasty with beam hardening artifact partially obscuring the adjacent soft tissue and osseous  structures. No periarticular fluid collection or osteolysis.  No acute fracture or dislocation. Old healed distal femoral metaphyseal fracture with mild posttraumatic deformity. Moderate joint effusion. Normal alignment.  Ligaments  Ligaments are suboptimally evaluated by CT.  Muscles and Tendons Muscles are normal. No muscle atrophy. No intramuscular fluid collection or hematoma. Distal quadriceps tendinosis. Patellar tendon is obscured by beam hardening artifact.  Soft tissue  No fluid collection or hematoma.  No soft tissue mass.  IMPRESSION: 1. Left total knee arthroplasty with beam hardening artifact partially obscuring the adjacent soft tissue and osseous structures. No periarticular fluid collection or osteolysis. 2. Moderate joint effusion. 3. Distal quadriceps tendinosis.  Electronically Signed   By: Julaine Blanch M.D.   On: 02/14/2024 10:10 Note: Reviewed        Physical Exam  Vitals: BP 111/63 (BP Location: Right Arm, Patient Position: Sitting, Cuff Size: Normal)   Pulse 98   Temp 97.9 F (36.6 C) (Temporal)   Resp 18   Ht 4' 11.5 (1.511 m)   Wt 106 lb (48.1 kg)   SpO2 97%   BMI 21.05 kg/m  BMI: Estimated body mass index is 21.05 kg/m as calculated from the following:   Height as of this encounter: 4' 11.5 (1.511 m).   Weight as of this encounter: 106 lb (48.1 kg). Ideal: Patient must be at least 60 in tall to calculate ideal body weight General appearance: Well nourished, well developed, and well hydrated. In no apparent acute distress Mental status: Alert, oriented x 3 (person, place, & time)       Respiratory: No evidence of acute respiratory distress Eyes: PERLA  Musculoskeletal: Left knee pain Assessment   Diagnosis Status  1. Chronic pain of left knee   2. Foraminal stenosis of cervical region (severe b/l C5/6; C6/7)   3. S/P revision of total knee, right   4. Medication management   5. Chronic midline low back pain without sciatica   6.  S/P hardware removal   7. Cervical disc disorder with radiculopathy of cervical region   8. Cervicogenic headache   9. Stenosis of cervical spine with myelopathy (HCC)   10. S/P cervical spinal fusion   11. Chronic pain syndrome    Controlled Controlled Controlled   Plan of Care  Assessment and Plan    Monitoring: [place] PMP: PDMP reviewed during this encounter. {Blank single:19197::Unable to conduct review of the controlled substance reporting system due to technological failure.,     } Pharmacotherapy: {Blank single:19197::Opioid-induced constipation (OIC)(K59.03, T40.2X5A),No side-effects or adverse reactions reported.} Compliance: {Blank single:19197::Medication agreement violation - unsanctioned acquisition/use of additional opioid-containing medication,No problems identified.} Effectiveness: {Blank single:19197::Clinically acceptable.}  No notes on file  UDS:  Summary  Date Value Ref Range Status  01/04/2024 FINAL  Final    Comment:    ==================================================================== Compliance Drug Analysis, Ur ==================================================================== Test                             Result       Flag       Units  Drug Present and Declared for Prescription Verification   Hydrocodone                     4070         EXPECTED   ng/mg creat   Dihydrocodeine                 128          EXPECTED   ng/mg creat   Norhydrocodone                 259          EXPECTED   ng/mg creat    A moderate to large amount of hydrocodone  is present; metabolite is    present at very low concentration. This is an  atypical result.    Although patients with unusual metabolic profiles exist, they are    rare. Review of previous drug screen results or collection of a    urine sample several hours after a WITNESSED dose of the drug may    help to clarify the subject's ability to produce metabolite.     Sources of hydrocodone  include  scheduled prescription medications.    Dihydrocodeine and norhydrocodone are expected metabolites of    hydrocodone . Dihydrocodeine is also available as a scheduled    prescription medication.    Gabapentin                      PRESENT      EXPECTED   Methocarbamol                   PRESENT      EXPECTED   Trazodone                       PRESENT      EXPECTED   1,3 chlorophenyl piperazine    PRESENT      EXPECTED    1,3-chlorophenyl piperazine is an expected metabolite of trazodone .    Duloxetine                      PRESENT      EXPECTED   Acetaminophen                   PRESENT      EXPECTED  Drug Present not Declared for Prescription Verification   Ibuprofen                       PRESENT      UNEXPECTED   Diphenhydramine                 PRESENT      UNEXPECTED   Diltiazem                       PRESENT      UNEXPECTED  Drug Absent but Declared for Prescription Verification   Clonazepam                      Not Detected UNEXPECTED ng/mg creat   Diclofenac                      Not Detected UNEXPECTED    Diclofenac , as indicated in the declared medication list, is not    always detected even when used as directed.    Lidocaine                       Not Detected UNEXPECTED    Lidocaine , as indicated in the declared medication list, is not    always detected even when used as directed.  ==================================================================== Test                      Result    Flag   Units      Ref Range   Creatinine              159              mg/dL      >=79 ==================================================================== Declared Medications:  The flagging and interpretation on this report are based on the  following declared medications.  Unexpected results may arise from  inaccuracies  in the declared medications.   **Note: The testing scope of this panel includes these medications:   Clonazepam  (Klonopin )  Duloxetine  (Cymbalta )  Gabapentin   Hydrocodone    Methocarbamol  (Robaxin )  Trazodone  (Desyrel )   **Note: The testing scope of this panel does not include small to  moderate amounts of these reported medications:   Acetaminophen   Diclofenac  (Voltaren )  Topical Lidocaine  (Lidoderm )   **Note: The testing scope of this panel does not include the  following reported medications:   Albuterol  (Duoneb)  Albuterol  (Combivent )  Alendronate  (Fosamax )  Apixaban  (Eliquis )  Buspirone  (Buspar )  Cefadroxil  (Duricef)  Dupilumab  (Dupixent )  Fluconazole  (Diflucan )  Fluticasone  (Advair)  Ipratropium (Duoneb)  Ipratropium (Combivent )  Montelukast  (Singulair )  Ondansetron  (Zofran )  Oxybutynin  (Ditropan )  Pantoprazole  (Protonix )  Polyethylene Glycol (MiraLAX )  Potassium (Klor-Con )  Ropinirole  (Requip )  Salmeterol (Advair)  Sumatriptan  (Imitrex ) ==================================================================== For clinical consultation, please call [phone]. ====================================================================     No results found for: CBDTHCR No results found for: D8THCCBX No results found for: D9THCCBX  ROS  Constitutional: {Blank single:19197::Denies any fever or chills} Gastrointestinal: {Blank single:19197::No reported hemesis, hematochezia, vomiting, or acute GI distress} Musculoskeletal: {Blank single:19197::Denies any acute onset joint swelling, redness, loss of ROM, or weakness} Neurological: {Blank single:19197::No reported episodes of acute onset apraxia, aphasia, dysarthria, agnosia, amnesia, paralysis, loss of coordination, or loss of consciousness}  Medication Review  Accu-Chek Guide Me, Accu-Chek Softclix Lancets, DULoxetine , Dupilumab , HYDROcodone -acetaminophen , Ipratropium-Albuterol , SUMAtriptan , acetaminophen , albuterol , alendronate , apixaban , busPIRone , cefadroxil , clonazePAM , diclofenac  Sodium, fluticasone -salmeterol, gabapentin , glucose blood, ipratropium-albuterol , lidocaine ,  methocarbamol , montelukast , ondansetron , oxybutynin , pantoprazole , polyethylene glycol, potassium chloride , rOPINIRole , and traZODone   History Review  Allergy: Summer Murphy is allergic to librium [chlordiazepoxide], reglan [metoclopramide], vanilla, aspirin, nsaids, azithromycin , buprenorphine hcl, flexeril  [cyclobenzaprine ], morphine, sulfa antibiotics, tolmetin, amlodipine  besylate, and iron . Drug: Summer Murphy  reports no history of drug use. Alcohol:  reports no history of alcohol use. Tobacco:  reports that she has never smoked. She has been exposed to tobacco smoke. She has never used smokeless tobacco. Social: Summer Murphy  reports that she has never smoked. She has been exposed to tobacco smoke. She has never used smokeless tobacco. She reports that she does not drink alcohol and does not use drugs. Medical:  has a past medical history of Acid reflux, Anemia, Anxiety, Arthritis, Aspiration pneumonia (HCC) (06/02/2022), Asthma, CAP (community acquired pneumonia) (04/24/2022), Cervical disc disorder with radiculopathy of cervical region, Chronic pain syndrome (05/25/2023), Chronic, continuous use of opioids, COPD (chronic obstructive pulmonary disease) (HCC), DOE (dyspnea on exertion), Hematuria, Hiatal hernia, History of kidney stones, Hypertension, Hypertensive urgency (05/12/2022), Hypoglycemia, Insomnia, LBBB (left bundle branch block), Migraine, MSSA bacteremia (04/19/2023), Murmur, Osteopenia, Palpitations, Pre-diabetes, Restless leg, Sepsis (HCC) (05/12/2022), Stenosis of cervical spine with myelopathy (HCC), and T2DM (type 2 diabetes mellitus) (HCC). Surgical: Summer Murphy  has a past surgical history that includes Ureteroscopy; Laparoscopic hysterectomy; Lithotripsy; HH repair; Cardiac electrophysiology study and ablation; Appendectomy (1990); Cholecystectomy (1990); Joint replacement (Bilateral, [id number]); Extracorporeal shock wave lithotripsy (Left, 09/12/2015); Tonsillectomy; Corneal  transplant; Abdominal hysterectomy; Breast surgery; Fracture surgery; Amputation toe (Left, 07/31/2016); Excision bone cyst (Left, 07/31/2016); Colonoscopy with propofol  (N/A, 01/04/2017); Esophagogastroduodenoscopy (egd) with propofol  (N/A, 01/04/2017); Back surgery; Total knee revision (Right, 11/14/2019); periprosthetic supracondylar fracture of left femur (02/16/2020); Hardware Removal (Left, 11/14/2020); Esophagogastroduodenoscopy (N/A, 04/09/2021); Gum surgery (Left); Posterior cervical fusion/foraminotomy (N/A, 03/02/2023); Cervical wound debridement (N/A, 04/17/2023); and laparotomy (N/A, 11/27/2023). Family: family history includes Breast cancer in her maternal aunt; Diabetes in her brother; Hypertension in her brother and mother; Kidney Stones in her father;  Prostate cancer in her father; Stroke in her mother.  Laboratory Chemistry Profile   Renal Lab Results  Component Value Date   BUN 23 02/01/2024   CREATININE 0.45 02/01/2024   BCR 17 01/15/2022   GFRAA >60 11/16/2019   GFRNONAA >60 02/01/2024    Hepatic Lab Results  Component Value Date   AST 25 02/01/2024   ALT 18 02/01/2024   ALBUMIN 3.4 (L) 02/01/2024   ALKPHOS 89 02/01/2024   LIPASE 19 11/26/2023    Electrolytes Lab Results  Component Value Date   NA 138 02/01/2024   K 4.4 02/01/2024   CL 102 02/01/2024   CALCIUM 9.1 02/01/2024   MG 1.9 11/30/2023   PHOS 2.9 04/18/2023    Bone Lab Results  Component Value Date   VD25OH 34.34 10/12/2023    Inflammation (CRP: Acute Phase) (ESR: Chronic Phase) Lab Results  Component Value Date   CRP 5.4 (H) 11/17/2023   ESRSEDRATE 63 (H) 11/17/2023   LATICACIDVEN 0.7 11/27/2023         Note: {Blank single:19197::Summer Murphy indicates labs done and monitored by primary care practitioner using a non-[facility] EMR system,No results found under the [facility] electronic medical record,Patient noncompliant with Lab Work orders.,Results made available to patient.,Lab  results reviewed and made available to patient.,Lab results reviewed and explained to patient in Layman's terms.,Above Lab results reviewed.}  Recent Imaging Review  CT KNEE LEFT WO CONTRAST CLINICAL DATA:  Left knee pain and swelling  EXAM: CT OF THE LEFT KNEE WITHOUT CONTRAST  TECHNIQUE: Multidetector CT imaging of the left knee was performed according to the standard protocol. Multiplanar CT image reconstructions were also generated.  RADIATION DOSE REDUCTION: This exam was performed according to the departmental dose-optimization program which includes automated exposure control, adjustment of the mA and/or kV according to patient size and/or use of iterative reconstruction technique.  COMPARISON:  Left knee x-ray 04/16/2023  FINDINGS: Bones/Joint/Cartilage  Generalized osteopenia.  Left total knee arthroplasty with beam hardening artifact partially obscuring the adjacent soft tissue and osseous structures. No periarticular fluid collection or osteolysis.  No acute fracture or dislocation. Old healed distal femoral metaphyseal fracture with mild posttraumatic deformity. Moderate joint effusion. Normal alignment.  Ligaments  Ligaments are suboptimally evaluated by CT.  Muscles and Tendons Muscles are normal. No muscle atrophy. No intramuscular fluid collection or hematoma. Distal quadriceps tendinosis. Patellar tendon is obscured by beam hardening artifact.  Soft tissue No fluid collection or hematoma.  No soft tissue mass.  IMPRESSION: 1. Left total knee arthroplasty with beam hardening artifact partially obscuring the adjacent soft tissue and osseous structures. No periarticular fluid collection or osteolysis. 2. Moderate joint effusion. 3. Distal quadriceps tendinosis.  Electronically Signed   By: Julaine Blanch M.D.   On: 02/14/2024 10:10 Note: {Blank single:19197::No new results found.,No results found under the [facility] electronic medical  record.,Imaging results reviewed and explained to patient in Layman's terms.,Results of ordered imaging test(s) reviewed and explained to patient in Layman's terms.,Imaging results reviewed.,Reviewed} {Blank single:19197::Results visible under [facility].,Results visible under [facility].,Results visible under [facility].,Results visible under [facility].,Results visible under Care Everywhere.,Results made available to patient.,Copy of results provided to patient.,Patient noncompliant with diagnostic imaging orders.,     }  Physical Exam  Vitals: There were no vitals taken for this visit. BMI: Estimated body mass index is 20.97 kg/m as calculated from the following:   Height as of 02/23/24: 4' 11.5 (1.511 m).   Weight as of 02/23/24: 105 lb  9.6 oz (47.9 kg). Ideal: Patient weight not recorded General appearance: {general exam:210120802::Well nourished, well developed, and well hydrated. In no apparent acute distress} Mental status: {Blank single:19197::Alert and oriented x 3. Exaggerated physical and/or psychosocial pain behavior perceived.,Alert, oriented x 3 (person, place, & time)} {Blank single:19197::Summer Murphy's speech pattern and demeanor seems to suggest oversedation,     } Respiratory: {Blank single:19197::Oxygen -dependent COPD,No evidence of acute respiratory distress} Eyes: {Blank single:19197::Miotic (pupilary constriction) due to opiate use,Midriatic,Anisocoric,Evidence of ptosis,Pin-point pupils,PERLA}   Assessment   Diagnosis Status  1. Foraminal stenosis of cervical region (severe b/l C5/6; C6/7)    {Blank single:19197::Absent,Deteriorating,Having a Flare-up,Improved,Improving,Not improving,Not responding,Persistent,Present,Recurring,Reoccurring,Resolved,Responding,Stable,Unchanged,improved,Worsened,Worsening,Controlled} {Blank single:19197::Absent,Deteriorating,Having a  Flare-up,Improved,Improving,Not improving,Not responding,Persistent,Present,Recurring,Reoccurring,Resolved,Responding,Stable,Unchanged,improved,Worsened,Worsening,Controlled} {Blank single:19197::Absent,Deteriorating,Having a Flare-up,Improved,Improving,Not improving,Not responding,Persistent,Present,Recurring,Reoccurring,Resolved,Responding,Stable,Unchanged,improved,Worsened,Worsening,Controlled}   Updated Problems: No problems updated.  Plan of Care  Problem-specific:  Assessment and Plan            Ms. CLOTILDA HAFER has a current medication list which includes the following long-term medication(s): albuterol , apixaban , clonazepam , combivent  respimat, duloxetine , dupixent , fluticasone -salmeterol, gabapentin , ipratropium-albuterol , montelukast , pantoprazole , potassium chloride , ropinirole , sumatriptan , and trazodone .  Pharmacotherapy (Medications Ordered): No orders of the defined types were placed in this encounter.  Orders:  No orders of the defined types were placed in this encounter.    {There is no content from the last Plan section.}   No follow-ups on file.    Recent Visits Date Type Provider Dept  02/21/24 Office Visit Lexia Vandevender K, NP [facility]  01/31/24 Procedure visit Marcelino Nurse, MD [facility]  01/24/24 Office Visit Furious Chiarelli K, NP [facility]  01/13/24 Office Visit Aniylah Avans K, NP [facility]  01/04/24 Office Visit Marcelino Nurse, MD [facility]  Showing recent visits within past 90 days and meeting all other requirements Future Appointments Date Type Provider Dept  03/21/24 Appointment Karam Dunson K, NP [facility]  Showing future appointments within next 90 days and meeting all other requirements  I discussed the assessment and treatment plan with the patient. The patient was provided an opportunity to ask questions  and all were answered. The patient agreed with the plan and demonstrated an understanding of the instructions.  Patient advised to call back or seek an in-person evaluation if the symptoms or condition worsens.  Duration of encounter: *** minutes.  Total time on encounter, as per AMA guidelines included both the face-to-face and non-face-to-face time personally spent by the physician and/or other qualified health care professional(s) on the day of the encounter (includes time in activities that require the physician or other qualified health care professional and does not include time in activities normally performed by clinical staff). Physician's time may include the following activities when performed: Preparing to see the patient (e.g., pre-charting review of records, searching for previously ordered imaging, lab work, and nerve conduction tests) Review of prior analgesic pharmacotherapies. Reviewing PMP Interpreting ordered tests (e.g., lab work, imaging, nerve conduction tests) Performing post-procedure evaluations, including interpretation of diagnostic procedures Obtaining and/or reviewing separately obtained history Performing a medically appropriate examination and/or evaluation Counseling and educating the patient/family/caregiver Ordering medications, tests, or procedures Referring and communicating with other health care professionals (when not separately reported) Documenting clinical information in the electronic or other health record Independently interpreting results (not separately reported) and communicating results to the patient/ family/caregiver Care coordination (not separately reported)  Note by: Samari Gorby K Shonda Mandarino, NP (TTS and AI technology used. I apologize for any typographical errors that were not detected and corrected.) Date: 03/21/2024; Time:  2:59 PM

## 2024-03-21 ENCOUNTER — Ambulatory Visit: Attending: Nurse Practitioner | Admitting: Nurse Practitioner

## 2024-03-21 ENCOUNTER — Other Ambulatory Visit: Payer: Self-pay | Admitting: Infectious Diseases

## 2024-03-21 ENCOUNTER — Encounter: Payer: Self-pay | Admitting: Nurse Practitioner

## 2024-03-21 DIAGNOSIS — G894 Chronic pain syndrome: Secondary | ICD-10-CM | POA: Insufficient documentation

## 2024-03-21 DIAGNOSIS — M4802 Spinal stenosis, cervical region: Secondary | ICD-10-CM | POA: Diagnosis not present

## 2024-03-21 DIAGNOSIS — M5010 Cervical disc disorder with radiculopathy, unspecified cervical region: Secondary | ICD-10-CM | POA: Insufficient documentation

## 2024-03-21 DIAGNOSIS — Z96651 Presence of right artificial knee joint: Secondary | ICD-10-CM | POA: Diagnosis not present

## 2024-03-21 DIAGNOSIS — Z79899 Other long term (current) drug therapy: Secondary | ICD-10-CM | POA: Diagnosis not present

## 2024-03-21 DIAGNOSIS — G8929 Other chronic pain: Secondary | ICD-10-CM | POA: Diagnosis present

## 2024-03-21 DIAGNOSIS — Z9889 Other specified postprocedural states: Secondary | ICD-10-CM | POA: Diagnosis present

## 2024-03-21 DIAGNOSIS — G4486 Cervicogenic headache: Secondary | ICD-10-CM | POA: Insufficient documentation

## 2024-03-21 DIAGNOSIS — M25562 Pain in left knee: Secondary | ICD-10-CM | POA: Insufficient documentation

## 2024-03-21 DIAGNOSIS — M545 Low back pain, unspecified: Secondary | ICD-10-CM | POA: Diagnosis not present

## 2024-03-21 DIAGNOSIS — M1712 Unilateral primary osteoarthritis, left knee: Secondary | ICD-10-CM | POA: Diagnosis not present

## 2024-03-21 MED ORDER — HYDROCODONE-ACETAMINOPHEN 5-325 MG PO TABS: 1.0000 | ORAL_TABLET | Freq: Three times a day (TID) | ORAL | 0 refills | Status: AC | PRN

## 2024-03-21 NOTE — Progress Notes (Signed)
 Nursing Pain Medication Assessment:  Safety precautions to be maintained throughout the outpatient stay will include: orient to surroundings, keep bed in low position, maintain call bell within reach at all times, provide assistance with transfer out of bed and ambulation.  Medication Inspection Compliance: Pill count conducted under aseptic conditions, in front of the patient. Neither the pills nor the bottle was removed from the patient's sight at any time. Once count was completed pills were immediately returned to the patient in their original bottle.  Medication: Hydrocodone /APAP Pill/Patch Count: 76 of 90 pills/patches remain Pill/Patch Appearance: Markings consistent with prescribed medication Bottle Appearance: Standard pharmacy container. Clearly labeled. Filled Date: 29 / 20 / 2025 Last Medication intake:  Today

## 2024-03-21 NOTE — Patient Instructions (Signed)

## 2024-03-22 ENCOUNTER — Encounter: Payer: Self-pay | Admitting: Nurse Practitioner

## 2024-03-30 ENCOUNTER — Ambulatory Visit: Admitting: Nurse Practitioner

## 2024-03-30 NOTE — Progress Notes (Signed)
 [facility] PHYSICAL THERAPY EVALUATION    DATE:03/30/2024 Patients Name:Summer Murphy    DOB:Jan 17, 1956 Referring Physician:Menz, Ozell Pac, MD    Medical Diagnosis: Left knee quadriceps tendinitis      Treatment Diagnosis: 1. Patellar tendinitis of left knee   2. Acute pain of left knee   3. Stiffness of left knee   4. Muscle weakness (generalized)    Is patient aware of diagnosis/prognosis: [x]  YES      []  NO Subjective:   This 69 year old white female returns to physical therapy in the management of left knee quadriceps tendinitis.  The patient last attended outpatient physical therapy in September 2025 for left patella tendinitis.  Since that time, patient has been challenged with additional medical complaints and compliance in physical therapy was not tolerated at that time.  The patient has been referred to pain management and ultimately underwent a genicular nerve block with little to no relief.  The patient has been further evaluated at the pain clinic.  Unfortunately, the patient appreciates very little change in her symptoms.  During her recent follow-up visit with Dr. Kathlynn on 02/23/2024, the patient was referred back to outpatient physical therapy.    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   Shingles    Past Surgical History:  Procedure Laterality Date   COLONOSCOPY  01/21/2001   Adenomatous Polyp   JOINT REPLACEMENT Right 10/29/2011   RIGHT TOTAL KNEE REPLACEMENT   EGD  01/16/2013    Barrett's Esophagus ([facility])   Left total knee replacement Left 10/26/2013   Arthroscopy, partial synovectomy, left knee Left 03/01/2014   EGD  04/28/2016   Barrett's Esophagus ([facility])   EGD  01/04/2017   No Barrett's Seen: No repeat per RTE   COLONOSCOPY  01/04/2017   Aborted d/t Poor Prep: FU GI OV made 02/25/2017 @ 2pm w/MJohnson PA (dw)   Total Knee Revision Surgery Right 11/14/2019   Menz, poly exchange   OPEN REDUCTION FEMORAL FRACTURE SUPRACONDYLAR/TRANSCONDYLAR Left 02/17/2020   Procedure: OPEN TREATMENT OF FEMORAL SUPRACONDYLAR OR TRANSCONDYLAR FRACTURE WITHINTERCONDYLAR EXTENSION, INCLUDES INTERNAL FIXATION, WHEN PERFORMED;  Surgeon: Martell Gist, MD;  Location: [facility];  Service: Orthopedics;  Laterality: Left;   Hardware removal left femur Left 11/14/2020   Menz   APPENDECTOMY     CHOLECYSTECTOMY     COLONOSCOPY  01/15/2012, 06/10/2004   PH Adenomatous Polyp: [facility] 12/2016; Recall Ltr mailed 12/04/2016 (dw)   EGD  04/17/1986, 03/08/1987, 06/12/1987, 09/16/1987, 03/18/1988, 03/07/1991, 01/21/2001, 06/10/2004, 11/04/2005, 06/08/2008, 07/12/2008, 05/06/2009, 07/03/2009, 09/18/2009, 06/29/2011, 11/10/2011, 01/15/2012   HYSTERECTOMY     Nissen fundoplication x2     Periprosthetic supracondylar fracture of femur, hardware removal left Left    REDUCTION MAMMAPLASTY     SIGMOIDOSCOPY FLEXIBLE  12/21/1985, 03/11/1987, 02/04/1990    Social History: The patient lives alone in a second-floor apartment with a level entry.  The patient has an elevator to reach her home.  The patient is retired.  Prior therapy for  same condition:  None recently.  The patient last received a physical therapy evaluation in September 2025.  Hospitalizations: None for this condition.  Prior level of function: The patient was independent with all home, community and leisure activities.  The patient was independent with ambulation and driving.  Current Functional level: The patient  reports to physical therapy in a clinic provided wheelchair.  The patient is ambulating short distances at home with the use of a rolling walker.  The patient continues with significant pain with prolonged standing and walking.  The patient is venturing out into the community very little at this time.  Moderate difficulty is noted with sit to stand transfers.  If the elevator is not working in her apartment building, the patient will avoid navigating the stairs.  Pain scale: Current resting pain = 8/10.  Pain at its worst with activity = 10/10.  The patient describes the pain as a deep ache with an occasional stabbing or burning sensation.  Aggravating: Fully bending and straightening the left knee, weightbearing and transfers  Alleviating: Medications, ice, heat and rest OBJECTIVE Postural/Observation: Observation reveals mild-moderate swelling into the left involved knee.  There is noted increased genu varus on the left compared to the right.  Observation reveals a full weight shift over to the right side with chair to/from table transfers.  AROM:    Right   Left Extension  -9   -5 Flexion   137   90   Strength:    Right   Left Hip Flexion  5/5   4+/5 p Hip Extension  5/5   5-/5 Hip Abduction  5/5   5-/5 Quadriceps  5-/5   3-/5 p Hamstrings  5-/5   5-/5  Dorsiflexion  5/5   5/5 Plantarflexion  5/5   5-/5   Special tests: The patient presents with fair patella mobility on the left in the superior/inferior and medial/lateral directions.  Good quadriceps tone is noted with an active quad set in terminal knee extension.  The patient demonstrates a moderate extension lag with an active SLR on the left.  Astym treatment revealed increased tissue texture along the left quadricep muscle belly, quadriceps tendon and predominantly along the lateral patella tendon.  Gait: The patient reports to physical therapy today in a clinic provided wheelchair.  The patient is utilizing a rolling  walker for ambulation assistance at home.  The patient is walking short distances in the home with the use of a rolling walker.  Palpation: Moderate tenderness noted along the left quadricep muscle belly and moderate to severe tenderness noted into the quadriceps tendon, patella tendon and along the lateral border of the patella.    ASSESSMENT: The patient is stable and uncomplicated.  A low complexity evaluation was performed this date.  This patient is an excellent candidate for physical therapy in an effort to decrease swelling, increase ROM, increase flexibility, increase strength, improve gait, decrease tenderness, decrease pain and to return this patient to her prior level of function without pain, discomfort or limitation.   PLAN: Patients Goals: The patient would like to decrease her left knee pain and increase her tolerance to standing and walking activities.  Long Term Goals: 1.  Decrease pain at its worst with activity to 5/10 or less. 2.  The patient will be independent in a thorough HEP to improve ROM, strength, posture and function in an effort to return to her prior level of function. 3.  Increase AROM of the left involved knee to  0 extension and 110 flexion or greater. 4.  Increase gross strength of the left knee, hip and ankle to 5/5 throughout. 5.  The patient will be able to ambulate on all surfaces with the least restrictive assistive device demonstrating good knee extension at initial contact, symmetrical stance time and normal knee flexion during swing to allow for increased tolerance to ambulation at home and in the community. 6.  The patient will be able to perform sit to/from stand transfers with weight evenly distributed over the LEs without increased left knee pain or discomfort to allow for increased tolerance to transfers in/out of a chair or vehicle. 7.  The patient will be able to ambulate up/down a flight of stairs with a normal reciprocal gait pattern using a HR  for balance assistance as needed without c/o increased left knee pain or discomfort to allow for increased tolerance to stair climbing in her apartment building if a power outage occurs.   Treatment Plan: The patient will receive modalities as needed including moist heat, ultrasound, infrared, iontophoresis, electrical stimulation, vasopneumatic compression and cold packs.  Therapeutic exercise and therapeutic activities will be performed to increase ROM, strength, flexibility, posture, ergonomics, balance, proprioception and function.  Manual therapy will be performed to increase ROM, strength, mobility and flexibility as well as decrease tension, spasms and tissue texture.  Neuromuscular reeducation and activities of daily living training will be performed to increase strength, stabilization, posture, ergonomics, balance, proprioception and function.  The patient may receive gait training to normailize the gait cycle.  In addition, the patient will participate in a progressive home exercise program to complement the formal physical therapy sessions. The patient will not schedule return PT appointments at this time.  The patient will receive the MD ordered bone scan and return to PT if cleared.  Frequency: 2 per week for 12 weeks. Precautions: DM, asthma, HTN and osteoporosis  Hip/Knee Treatment Log   Date   03/30/24           Bike(seat____)            Stepper (seat     )            Elliptical              LP  Squats/Plyo            LP  Single Leg            LP  calf raises            Wall Slide/mini/BOSU            Stool Scoots            Step (up,down,lateral)            Multi-Hip TKE            Multi-Hip Flex/Abd            Multi-Hip Ext/Add            Knee Extension(single/Bilat)            Leg Curls(single/Bilat)            SAQ/LAQ            SLR (flex/abd/add/ext)            Quad Sets            Lunges            NMR: 3 point  NMR: T-band proprio            NMR: Wobble  Board/BOSU            NMR: Agility            HEP: Quad sets, SAQ, SLR, heel slides and towel flexion stretch.  A handout was provided. 10'                                   Flexionator/Extensionator            Astym to the left knee 10'           Manual - PROM for knee flex/ext, ant/post glides, patella mobs            Infrared 9.9 J/cm2, 8 cycles to left patella tendon            HP/CP with/ without  IFC            Vasopneumatic Level 1/2/3               PT Billing Documentation Date of Onset: 11/12/23 Visit Number: 3       Frequently Used Timed Codes Therapeutic Procedure CPT 97110 : 10 minutes Manual Therapy CPT 97140: 10 minutes                 Total Treatment Time: 45 minutes  This note was generated in part with voice recognition software and I apologize for any typographical errors that were not detected and corrected. If the patient does not return for follow up visit(s) related to this episode of care, this note will serve as their discharge note from physical therapy.    Therapist:Bradley JULIANNA Sous, PT                        Date:03/30/2024

## 2024-04-04 ENCOUNTER — Other Ambulatory Visit: Payer: Self-pay | Admitting: Infectious Diseases

## 2024-04-04 ENCOUNTER — Other Ambulatory Visit: Payer: Self-pay

## 2024-04-06 ENCOUNTER — Ambulatory Visit: Attending: Infectious Diseases | Admitting: Infectious Diseases

## 2024-04-06 ENCOUNTER — Telehealth: Payer: Self-pay | Admitting: Nurse Practitioner

## 2024-04-06 ENCOUNTER — Other Ambulatory Visit
Admission: RE | Admit: 2024-04-06 | Discharge: 2024-04-06 | Disposition: A | Discharge status: Home / Self Care | Attending: Infectious Diseases | Admitting: Infectious Diseases

## 2024-04-06 VITALS — BP 122/63 | HR 95 | Temp 99.3°F | Wt 100.0 lb

## 2024-04-06 DIAGNOSIS — K219 Gastro-esophageal reflux disease without esophagitis: Secondary | ICD-10-CM | POA: Insufficient documentation

## 2024-04-06 DIAGNOSIS — Z886 Allergy status to analgesic agent status: Secondary | ICD-10-CM | POA: Insufficient documentation

## 2024-04-06 DIAGNOSIS — I10 Essential (primary) hypertension: Secondary | ICD-10-CM | POA: Insufficient documentation

## 2024-04-06 DIAGNOSIS — Z681 Body mass index (BMI) 19 or less, adult: Secondary | ICD-10-CM | POA: Diagnosis not present

## 2024-04-06 DIAGNOSIS — B9561 Methicillin susceptible Staphylococcus aureus infection as the cause of diseases classified elsewhere: Secondary | ICD-10-CM | POA: Insufficient documentation

## 2024-04-06 DIAGNOSIS — M810 Age-related osteoporosis without current pathological fracture: Secondary | ICD-10-CM | POA: Diagnosis not present

## 2024-04-06 DIAGNOSIS — Z96653 Presence of artificial knee joint, bilateral: Secondary | ICD-10-CM | POA: Insufficient documentation

## 2024-04-06 DIAGNOSIS — K449 Diaphragmatic hernia without obstruction or gangrene: Secondary | ICD-10-CM | POA: Insufficient documentation

## 2024-04-06 DIAGNOSIS — M4646 Discitis, unspecified, lumbar region: Secondary | ICD-10-CM | POA: Diagnosis present

## 2024-04-06 DIAGNOSIS — M4626 Osteomyelitis of vertebra, lumbar region: Secondary | ICD-10-CM | POA: Diagnosis not present

## 2024-04-06 DIAGNOSIS — M25562 Pain in left knee: Secondary | ICD-10-CM | POA: Diagnosis not present

## 2024-04-06 DIAGNOSIS — R7881 Bacteremia: Secondary | ICD-10-CM | POA: Diagnosis present

## 2024-04-06 DIAGNOSIS — G062 Extradural and subdural abscess, unspecified: Secondary | ICD-10-CM

## 2024-04-06 DIAGNOSIS — D649 Anemia, unspecified: Secondary | ICD-10-CM | POA: Diagnosis not present

## 2024-04-06 DIAGNOSIS — R630 Anorexia: Secondary | ICD-10-CM | POA: Insufficient documentation

## 2024-04-06 DIAGNOSIS — Z79899 Other long term (current) drug therapy: Secondary | ICD-10-CM | POA: Diagnosis not present

## 2024-04-06 DIAGNOSIS — J4489 Other specified chronic obstructive pulmonary disease: Secondary | ICD-10-CM | POA: Diagnosis not present

## 2024-04-06 DIAGNOSIS — F419 Anxiety disorder, unspecified: Secondary | ICD-10-CM | POA: Diagnosis not present

## 2024-04-06 DIAGNOSIS — R197 Diarrhea, unspecified: Secondary | ICD-10-CM | POA: Diagnosis not present

## 2024-04-06 DIAGNOSIS — Z885 Allergy status to narcotic agent status: Secondary | ICD-10-CM | POA: Insufficient documentation

## 2024-04-06 DIAGNOSIS — K6812 Psoas muscle abscess: Secondary | ICD-10-CM | POA: Diagnosis not present

## 2024-04-06 LAB — C-REACTIVE PROTEIN: CRP: 4.6 mg/dL — ABNORMAL HIGH

## 2024-04-06 LAB — SEDIMENTATION RATE: Sed Rate: 49 mm/h — ABNORMAL HIGH (ref 0–30)

## 2024-04-06 NOTE — Telephone Encounter (Signed)
 Attempted to contact patient to schedule appointment with [facility]. No answer & mailbox full. Sent message-tw

## 2024-04-06 NOTE — Progress Notes (Signed)
 NAME: Summer Murphy  DOB: Oct 01, 1955  MRN: [id number]  Date/Time: 04/06/2024 11:02 AM   Subjective:   Pt is here with her friend H/o MSSA bacteremia an extensive infection of spine- cervical and lumbar in Jan 2025 After IV antibiotics she has been on cefadroxil  suppressive therapy She has some diarrhea Been losing weight - 100 pounds now- in Aug she was 117 She has left knee pain and is getting PT She has osteoporosis and on fosfomax  Following taking from previous note MIRELLA GUEYE is a 69 y.o female. with a history of cervical myelpathy for which she  underwent c3-c6 laminectomy and posterior fusion on 03/02/23, Asthma, COPD, barrettts esophagus, hiatus hernia, HTN,  B/l TKA  Admitted  [facility]  04/16/23-04/28/23 for MSSA bacteremia MRI of the cervical spine  showed collection both deep at the laminectomy bed and superficial at the site of surgical incision site. I/D by neurosurgeon on 04/17/23 and deep culture was MSSA  Pt was also c/o b/l groin pain and leg pain and had MRI of the lumbar spine on 04/25/23 and it showed discitis-osteomyelitis at L3-L4 and L4-L5. Small amount of enhancing fluid in the anterior epidural space between the L3 and L4 levels with suspected epidural abscess in the left lateral recess at the level of the L4 vertebral body. 2. Bilateral psoas muscle abscesses. IR aspiration of that fluid was no growth This was managed by Iv antibiotics   She was sent to SNF on 2/4 with 6 weeks of cefazolin  to finish on 06/07/23 But she was discharged from SNF on 05/18/23  because of insurance. She received weekly Dalbavancin X 3 doses at day surgery on 2/25, 3/4 and 3/11  She saw me on 3/18 and I started her on Po cefadroxil  1 gram BID for 8 weeks and then she went on cefadroxil  500mg  Po BID She continued to have pain in her shoulders/neck and neurosurgery did CT in may 2025  and that showed loosening screws bilateral C3 and C6 laminar screws, and also the right C4 screw.  3.  No postoperative arthrodesis identified, except for possible developing left C4-C5 facet fusion. Underlying degenerative appearing facet ankylosis on the left at C2-C3. They were concerned about osteoporosis and her ongoing weight loss She was seen by ortho PA  on 6/30 and ordered Dexa scan which was done on 8/14 . She is unable to take fosomax because of GERD She is followed by pulmonary and saw them on 7/14  for asthma and is on Dupilumab   it was decided to continue cefadroxil   500mg  Po BID indefinitely because of lossening screws on CT  On 7/22 Sed rate was 63 and CRP 3.2 The cefadroxil  was increased to 1 gram PO BID on 7/28 because of worsening neck pain. She saw pain management Dr.Lateef on 10/26/23. Received ESI to cervical spine area and nerve blck on 8/13 and is feeling much better. The high dose cefadroxil  caused diarrhea and cdiff was to be done but she never got it done    she was hopsitalized for incarcerated rt inguinal hernia and underwent surgery on 11/27/23 at [facility]-   She is on  suppressive Cefadroxil   for MSSA spine infection She has some diarrhea  She has no nausea or vomiting Appetite poor Dentures dont fit because of weight loss and she is not able to eat well   Past Medical History:  Diagnosis Date   Acid reflux    Anemia    Anxiety    a.) buspirone   BID + BZO PRN (clonazepam )   Arthritis    Aspiration pneumonia (HCC) 06/02/2022   Asthma    CAP (community acquired pneumonia) 04/24/2022   Cervical disc disorder with radiculopathy of cervical region    Chronic pain syndrome 05/25/2023   a.) on COT as prescribed by pain mangement   Chronic, continuous use of opioids    a.) followed by pain mangement   COPD (chronic obstructive pulmonary disease) (HCC)    DOE (dyspnea on exertion)    Hematuria    Hiatal hernia    History of kidney stones    Hypertension    Hypertensive urgency 05/12/2022   Hypoglycemia    Insomnia    a.) uses Trazodone  PRN   LBBB (left bundle  branch block)    Migraine    MSSA bacteremia 04/19/2023   Murmur    Osteopenia    Palpitations    Pre-diabetes    Restless leg    a.) on ropinirole    Sepsis (HCC) 05/12/2022   Stenosis of cervical spine with myelopathy (HCC)    T2DM (type 2 diabetes mellitus) (HCC)     Past Surgical History:  Procedure Laterality Date   ABDOMINAL HYSTERECTOMY     AMPUTATION TOE Left 07/31/2016   Procedure: AMPUTATION TOE/MPJ 2nd toe;  Surgeon: Neill Boas, DPM;  Location: [facility] ORS;  Service: Podiatry;  Laterality: Left;   APPENDECTOMY  1990   BACK SURGERY     low back   BREAST SURGERY     bilateral breast reduction   CARDIAC ELECTROPHYSIOLOGY STUDY AND ABLATION     CERVICAL WOUND DEBRIDEMENT N/A 04/17/2023   Procedure: CERVICAL WOUND DEBRIDEMENT;  Surgeon: Bluford Standing, MD;  Location: [facility] ORS;  Service: Neurosurgery;  Laterality: N/A;   CHOLECYSTECTOMY  1990   COLONOSCOPY WITH PROPOFOL  N/A 01/04/2017   Procedure: COLONOSCOPY WITH PROPOFOL ;  Surgeon: Viktoria Lamar DASEN, MD;  Location: [facility] ENDOSCOPY;  Service: Endoscopy;  Laterality: N/A;   CORNEAL TRANSPLANT     ESOPHAGOGASTRODUODENOSCOPY N/A 04/09/2021   Procedure: ESOPHAGOGASTRODUODENOSCOPY (EGD);  Surgeon: Unk Corinn Skiff, MD;  Location: [facility] ENDOSCOPY;  Service: Gastroenterology;  Laterality: N/A;   ESOPHAGOGASTRODUODENOSCOPY (EGD) WITH PROPOFOL  N/A 01/04/2017   Procedure: ESOPHAGOGASTRODUODENOSCOPY (EGD) WITH PROPOFOL ;  Surgeon: Viktoria Lamar DASEN, MD;  Location: [facility] ENDOSCOPY;  Service: Endoscopy;  Laterality: N/A;   EXCISION BONE CYST Left 07/31/2016   Procedure: EXCISION BONE CYST/exostectomy 28124/left 2nd;  Surgeon: Neill Boas, DPM;  Location: [facility] ORS;  Service: Podiatry;  Laterality: Left;   EXTRACORPOREAL SHOCK WAVE LITHOTRIPSY Left 09/12/2015   Procedure: EXTRACORPOREAL SHOCK WAVE LITHOTRIPSY (ESWL);  Surgeon: Rosina Riis, MD;  Location: [facility] ORS;  Service: Urology;  Laterality: Left;   FRACTURE SURGERY     left foot   GUM  SURGERY Left    gum infection   HARDWARE REMOVAL Left 11/14/2020   Procedure: HARDWARE REMOVAL;  Surgeon: Kathlynn Sharper, MD;  Location: [facility] ORS;  Service: Orthopedics;  Laterality: Left;   HH repair     Fundoplication   JOINT REPLACEMENT Bilateral 2013,2014   total knees   LAPAROSCOPIC HYSTERECTOMY     LAPAROTOMY N/A 11/27/2023   Procedure: LAPAROTOMY, EXPLORATORY lysis of adhesions;  Surgeon: Lane Shope, MD;  Location: [facility] ORS;  Service: General;  Laterality: N/A;   LITHOTRIPSY     periprosthetic supracondylar fracture of left femur  02/16/2020   [facility]   POSTERIOR CERVICAL FUSION/FORAMINOTOMY N/A 03/02/2023   Procedure: C3-6 POSTERIOR CERVICAL LAMINECTOMY AND FUSION;  Surgeon: Claudene Riis ORN, MD;  Location: [facility]  ORS;  Service: Neurosurgery;  Laterality: N/A;   TONSILLECTOMY     TOTAL KNEE REVISION Right 11/14/2019   Procedure: Revision patella and tibial polyethylene;  Surgeon: Kathlynn Sharper, MD;  Location: [facility] ORS;  Service: Orthopedics;  Laterality: Right;   URETEROSCOPY      Social History   Socioeconomic History   Marital status: Widowed    Spouse name: Not on file   Number of children: Not on file   Years of education: Not on file   Highest education level: Not on file  Occupational History   Not on file  Tobacco Use   Smoking status: Never    Passive exposure: Past   Smokeless tobacco: Never  Vaping Use   Vaping status: Never Used  Substance and Sexual Activity   Alcohol use: No   Drug use: No   Sexual activity: Not Currently  Other Topics Concern   Not on file  Social History Narrative   Lives at home alone. Relatives are the support person.    Social Drivers of Health   Tobacco Use: Low Risk (03/22/2024)   Patient History    Smoking Tobacco Use: Never    Smokeless Tobacco Use: Never    Passive Exposure: Past  Financial Resource Strain: Low Risk  (03/30/2024)   Received from [facility]   Overall Financial Resource Strain  (CARDIA)    Difficulty of Paying Living Expenses: Not very hard  Food Insecurity: Food Insecurity Present (03/30/2024)   Received from [facility]   Epic    Within the past 12 months, you worried that your food would run out before you got the money to buy more.: Sometimes true    Within the past 12 months, the food you bought just didn't last and you didn't have money to get more.: Never true  Transportation Needs: No Transportation Needs (03/30/2024)   Received from [facility] - Transportation    In the past 12 months, has lack of transportation kept you from medical appointments or from getting medications?: No    Lack of Transportation (Non-Medical): No  Physical Activity: Not on file  Stress: Not on file  Social Connections: Moderately Integrated (11/26/2023)   Social Connection and Isolation Panel    Frequency of Communication with Friends and Family: More than three times a week    Frequency of Social Gatherings with Friends and Family: More than three times a week    Attends Religious Services: 1 to 4 times per year    Active Member of [employer] or Organizations: Yes    Attends Banker Meetings: 1 to 4 times per year    Marital Status: Widowed  Intimate Partner Violence: Not At Risk (11/26/2023)   Epic    Fear of Current or Ex-Partner: No    Emotionally Abused: No    Physically Abused: No    Sexually Abused: No  Depression (PHQ2-9): Low Risk (03/21/2024)   Depression (PHQ2-9)    PHQ-2 Score: 0  Alcohol Screen: Low Risk (12/31/2021)   Alcohol Screen    Last Alcohol Screening Score (AUDIT): 0  Housing: Low Risk  (03/30/2024)   Received from [facility]   Epic    In the last 12 months, was there a time when you were not able to pay the mortgage or rent on time?: No    In the past 12 months, how many times have you moved where you were living?: 0  At any time in the past 12 months, were you homeless or living  in a shelter (including now)?: No  Utilities: Not At Risk (02/23/2024)   Received from [facility]   Epic    In the past 12 months has the electric, gas, oil, or water  company threatened to shut off services in your home?: No  Health Literacy: Not on file    Family History  Problem Relation Age of Onset   Hypertension Mother    Stroke Mother    Prostate cancer Father    Kidney Stones Father    Diabetes Brother    Hypertension Brother    Breast cancer Maternal Aunt    Kidney disease Neg Hx    Allergies  Allergen Reactions   Librium [Chlordiazepoxide] Shortness Of Breath   Reglan [Metoclopramide] Hives and Other (See Comments)    hallucinations    Vanilla Shortness Of Breath    Pt reports allergy to vanilla extract only   Aspirin Hives   Nsaids Rash    Rash/flares asthma issues.   Azithromycin  Other (See Comments)    Unsure of what the reaction was.   Buprenorphine Hcl Other (See Comments)    Unsure of what the reaction was.    Flexeril  [Cyclobenzaprine ]     hallucinations   Morphine Other (See Comments)    Unsure of reaction   Sulfa Antibiotics     Other reaction(s): Unknown   Tolmetin     Other Reaction: Allergy   Amlodipine  Besylate Itching and Rash    arms, stomach and forehead   Iron  Nausea And Vomiting   I? Current Outpatient Medications  Medication Sig Dispense Refill   Accu-Chek Softclix Lancets lancets USE TO TEST BLOOD SUGAR EVERY DAY AS NEEDED FOR type 2 diabetes 200 each 1   albuterol  (VENTOLIN  HFA) 108 (90 Base) MCG/ACT inhaler Inhale 2 puffs into the lungs every 6 (six) hours as needed for wheezing or shortness of breath. 8 g 2   alendronate  (FOSAMAX ) 70 MG tablet TAKE 1 TABLET BY MOUTH WEEKLY  TAKE WITH A FULL GLASS OF WATER   ON AN EMPTY STOMACH 12 tablet 3   apixaban  (ELIQUIS ) 5 MG TABS tablet Take 1 tablet (5 mg total) by mouth 2 (two) times daily. 180 tablet 3   Blood Glucose Monitoring Suppl (ACCU-CHEK GUIDE ME) w/Device KIT Use  device to check glucose daily and as need for prediabetes R73.03 1 kit 0   busPIRone  (BUSPAR ) 10 MG tablet Take 1 tablet (10 mg total) by mouth 2 (two) times daily. 180 tablet 3   cefadroxil  (DURICEF) 500 MG capsule TAKE 1 CAPSULE BY MOUTH TWICE DAILY 60 capsule 2   clonazePAM  (KLONOPIN ) 1 MG tablet Take 0.5-1 tablets (0.5-1 mg total) by mouth 2 (two) times daily as needed for anxiety. 60 tablet 0   COMBIVENT  RESPIMAT 20-100 MCG/ACT AERS respimat 1 puff every 6 (six) hours.     diclofenac  Sodium (VOLTAREN ) 1 % GEL Apply 4 g topically 4 (four) times daily. 350 g 1   DULoxetine  (CYMBALTA ) 60 MG capsule Take 1 capsule (60 mg total) by mouth daily. 90 capsule 3   Dupilumab  (DUPIXENT ) 300 MG/2ML SOAJ Inject 300 mg into the skin every 14 (fourteen) days. 4 mL 3   fluticasone -salmeterol (WIXELA INHUB) 250-50 MCG/ACT AEPB Inhale 1 puff into the lungs in the morning and at bedtime.     gabapentin  (NEURONTIN ) 300 MG capsule Take 1 capsule (300 mg total) by mouth 2 (two) times daily. 180  capsule 3   glucose blood (ACCU-CHEK GUIDE TEST) test strip TEST BLOOD SUGAR ONCE DAILY AND AS NEEDED FOR type 2 diabetes 200 strip 1   [START ON 04/10/2024] HYDROcodone -acetaminophen  (NORCO/VICODIN) 5-325 MG tablet Take 1 tablet by mouth every 8 (eight) hours as needed for severe pain (pain score 7-10). 90 tablet 0   [START ON 05/10/2024] HYDROcodone -acetaminophen  (NORCO/VICODIN) 5-325 MG tablet Take 1 tablet by mouth every 8 (eight) hours as needed for severe pain (pain score 7-10). 90 tablet 0   ipratropium-albuterol  (DUONEB) 0.5-2.5 (3) MG/3ML SOLN Take 3 mLs by nebulization every 6 (six) hours as needed.     lidocaine  (LIDODERM ) 5 % PLACE 1 PATCH ONTO THE AFFECTED AREA OF THE NECK DAILY, REMOVE AND DISCARD WITHIN 12 HOURS OR AS DIRECTED 30 patch 11   methocarbamol  (ROBAXIN ) 750 MG tablet TAKE 1 TABLET BY MOUTH EVERY 8 HOURS AS NEEDED FOR MUSCLE SPASMS 90 tablet 1   montelukast  (SINGULAIR ) 10 MG tablet TAKE 1 TABLET(10 MG) BY  MOUTH AT BEDTIME 90 tablet 3   ondansetron  (ZOFRAN ) 4 MG tablet TAKE 1 TABLET BY MOUTH TWICE DAILY AS NEEDED 45 tablet 1   oxybutynin  (DITROPAN -XL) 10 MG 24 hr tablet TAKE 1 TABLET(10 MG) BY MOUTH AT BEDTIME 30 tablet 5   pantoprazole  (PROTONIX ) 40 MG tablet Take 1 tablet (40 mg total) by mouth 2 (two) times daily. 180 tablet 1   polyethylene glycol (MIRALAX  / GLYCOLAX ) 17 g packet Take 17 g by mouth 2 (two) times daily.     potassium chloride  (KLOR-CON  10) 10 MEQ tablet Take 1 tablet (10 mEq total) by mouth daily. 90 tablet 1   rOPINIRole  (REQUIP ) 1 MG tablet TAKE 1 TABLET(1 MG) BY MOUTH AT BEDTIME 90 tablet 1   SUMAtriptan  (IMITREX ) 100 MG tablet TAKE 1 TABLET BY MOUTH EVERY 2 HOURS AS NEEDED (FOR MIGRAINE HEADACHES). MAY REPEAT IN 1 HOUR IF HEADACHE PERSISTS 10 tablet 11   traZODone  (DESYREL ) 150 MG tablet TAKE 1 TABLET(150 MG) BY MOUTH AT BEDTIME 90 tablet 3   No current facility-administered medications for this visit.     Abtx:  Anti-infectives (From admission, onward)    None       REVIEW OF SYSTEMS:  Const: negative fever, negative chills,  weight loss- 100 pounds now- lost 17 pounds in 5 months Eyes: negative diplopia or visual changes, negative eye pain ENT: negative coryza, negative sore throat Resp: negative cough, hemoptysis, dyspnea Cards: negative for chest pain, palpitations, lower extremity edema GU: negative for frequency, dysuria and hematuria GI: Negative for abdominal pain, diarrhea, bleeding, constipation Skin: negative for rash and pruritus Heme: negative for easy bruising and gum/nose bleeding FD:ozqu knee pain Some left groin pain Psych:  anxiety, depression  Endocrine: DM- not any more due to weight loss Allergy/Immunology- as above Objective:  VITALS:  BP 122/63   Pulse 95   Temp 99.3 F (37.4 C) (Oral)   Wt 100 lb (45.4 kg)   SpO2 93%   BMI 19.86 kg/m    PHYSICAL EXAM:  General: Alert, cooperative, no distress, in wheel chair- significant  weight loss Poor dentition Missing teeth Chest b/l air entry Hs s1s2 Abd soft No knee swelling     ? Impression/Recommendation  c3-c6 laminectomy and posterior fusion on 03/02/23? Complicated by MSSA bacteremia  due to cervical spine surgical site infection 04/16/23 MRI showed superficial and deep abscess- underwent I/D on 04/17/23 and culture was MSSA Repeat blood culture from 1/26  and 2/2 were neg Pt was  to have  IV cefazoin for total of 6 weeks until 3/17, but on 2/25 she was discharged from SNF due to insurance- So she received Iv dalbavance weekly dose ( 2/25, 1500mg , 3/4 1 grm, 3./11 - 1gm. Because of hardware added rifampin  300mg  Po BID but she was not able to to tolerate the rifampin  as she had severe nausea and poor appetite.  Hence it was discontinued.  On 06/08/23 she was  started on  PO cefadroxil  1 gram Q 12 -with a plan to give for 8 weeks because of hardware  CT 07/29/23 done on 07/29/23 showed Loosening of the bilateral C3 and C6 laminar screws, also the right C4 screw No arthrodesis So it was decided to continue cefadroxil   500mg  Po BID indefinitely   On 7/22 Sed rate was 63 and CRP 3.2 She is currently on 500mg  BID cefadroxil  - plan is to give indefinitely Will check ESR/CRP today Because of diarrhea- check Cdiff     L4 and L4-L5 discitis and osteomyelitis -treated  underwent aspiration of the collection on 04/25/23  but culture neg- Lumbar  pain is not an issue anymore    In aug 2025 Weakness, dizziness, weight loss BP on the lower side 104/54 HR 80- r/o dehydration serum cortisol  was low 2.4  in Feb 2024 cortisol and acth  on 11/17/23 - ACTH   was very low- 1.6  cortisol was 7.8 Will discuss with her PCP   Weight loss- on going - discussed high protein high calorie intake     Anemia    Hiatus hernia h/o fundoplication Dysphagia has had esophageal dilatation Nov 2024 Biopsy no evidence of metaplasia ? Anxiety  on meds ? H/o Hypokalemia- intermittent    Asthma/COPD- followed by pulmonologist and  on dupilumab   Spoke to  her PCP  Mardy Maxin regarding the  cortisol /ACTH  significance and management and whether need referral to endocrine She will get more labs and refer accordingly  ________________________________________________ Follow up 6 months Will touch base with her after her lab results

## 2024-04-06 NOTE — Patient Instructions (Signed)
 You are here for follow of the spine infection with MSSA You are on cefadroxil  500mg  twice a day- we will check Cdiff because of diarrhea As you have lost 17 pounds in 6 months- advised high protein diet, high calorie diet Will do ESR/CRP today

## 2024-04-12 ENCOUNTER — Other Ambulatory Visit: Payer: Self-pay | Admitting: Nurse Practitioner

## 2024-04-12 DIAGNOSIS — R3981 Functional urinary incontinence: Secondary | ICD-10-CM

## 2024-04-12 NOTE — Progress Notes (Signed)
 error

## 2024-04-13 ENCOUNTER — Other Ambulatory Visit: Payer: Self-pay | Admitting: Infectious Diseases

## 2024-04-19 ENCOUNTER — Other Ambulatory Visit
Admission: RE | Admit: 2024-04-19 | Discharge: 2024-04-19 | Disposition: A | Discharge status: Home / Self Care | Source: Ambulatory Visit | Attending: Nurse Practitioner | Admitting: Nurse Practitioner

## 2024-04-19 ENCOUNTER — Encounter: Payer: Self-pay | Admitting: Nurse Practitioner

## 2024-04-19 ENCOUNTER — Ambulatory Visit: Admitting: Nurse Practitioner

## 2024-04-19 VITALS — BP 132/68 | HR 114 | Temp 97.2°F | Resp 16 | Ht 59.5 in | Wt 102.2 lb

## 2024-04-19 DIAGNOSIS — E876 Hypokalemia: Secondary | ICD-10-CM | POA: Insufficient documentation

## 2024-04-19 DIAGNOSIS — G63 Polyneuropathy in diseases classified elsewhere: Secondary | ICD-10-CM | POA: Insufficient documentation

## 2024-04-19 DIAGNOSIS — R634 Abnormal weight loss: Secondary | ICD-10-CM | POA: Diagnosis not present

## 2024-04-19 DIAGNOSIS — G2581 Restless legs syndrome: Secondary | ICD-10-CM | POA: Insufficient documentation

## 2024-04-19 DIAGNOSIS — D509 Iron deficiency anemia, unspecified: Secondary | ICD-10-CM

## 2024-04-19 DIAGNOSIS — M5412 Radiculopathy, cervical region: Secondary | ICD-10-CM

## 2024-04-19 DIAGNOSIS — M25562 Pain in left knee: Secondary | ICD-10-CM | POA: Diagnosis not present

## 2024-04-19 DIAGNOSIS — E1169 Type 2 diabetes mellitus with other specified complication: Secondary | ICD-10-CM | POA: Insufficient documentation

## 2024-04-19 DIAGNOSIS — M25462 Effusion, left knee: Secondary | ICD-10-CM | POA: Diagnosis not present

## 2024-04-19 DIAGNOSIS — M4802 Spinal stenosis, cervical region: Secondary | ICD-10-CM | POA: Diagnosis not present

## 2024-04-19 DIAGNOSIS — M816 Localized osteoporosis [Lequesne]: Secondary | ICD-10-CM | POA: Insufficient documentation

## 2024-04-19 DIAGNOSIS — R3981 Functional urinary incontinence: Secondary | ICD-10-CM | POA: Insufficient documentation

## 2024-04-19 LAB — IRON AND TIBC
Iron: 11 ug/dL — ABNORMAL LOW (ref 28–170)
Saturation Ratios: 5 % — ABNORMAL LOW (ref 10.4–31.8)
TIBC: 218 ug/dL — ABNORMAL LOW (ref 250–450)
UIBC: 208 ug/dL

## 2024-04-19 LAB — COMPREHENSIVE METABOLIC PANEL WITH GFR
ALT: 16 U/L (ref 0–44)
AST: 15 U/L (ref 15–41)
Albumin: 3.4 g/dL — ABNORMAL LOW (ref 3.5–5.0)
Alkaline Phosphatase: 94 U/L (ref 38–126)
Anion gap: 9 (ref 5–15)
BUN: 13 mg/dL (ref 8–23)
CO2: 28 mmol/L (ref 22–32)
Calcium: 8.7 mg/dL — ABNORMAL LOW (ref 8.9–10.3)
Chloride: 100 mmol/L (ref 98–111)
Creatinine, Ser: 0.35 mg/dL — ABNORMAL LOW (ref 0.44–1.00)
GFR, Estimated: >60 mL/min
Glucose, Bld: 138 mg/dL — ABNORMAL HIGH (ref 70–99)
Potassium: 3.8 mmol/L (ref 3.5–5.1)
Sodium: 138 mmol/L (ref 135–145)
Total Bilirubin: <0.2 mg/dL (ref 0.0–1.2)
Total Protein: 6.4 g/dL — ABNORMAL LOW (ref 6.5–8.1)

## 2024-04-19 LAB — CBC WITH DIFFERENTIAL/PLATELET
Abs Immature Granulocytes: 0.04 10*3/uL (ref 0.00–0.07)
Basophils Absolute: 0 10*3/uL (ref 0.0–0.1)
Basophils Relative: 0 %
Eosinophils Absolute: 0.2 10*3/uL (ref 0.0–0.5)
Eosinophils Relative: 3 %
HCT: 25.1 % — ABNORMAL LOW (ref 36.0–46.0)
Hemoglobin: 7.8 g/dL — ABNORMAL LOW (ref 12.0–15.0)
Immature Granulocytes: 1 %
Lymphocytes Relative: 15 %
Lymphs Abs: 1.2 10*3/uL (ref 0.7–4.0)
MCH: 23 pg — ABNORMAL LOW (ref 26.0–34.0)
MCHC: 31.1 g/dL (ref 30.0–36.0)
MCV: 74 fL — ABNORMAL LOW (ref 80.0–100.0)
Monocytes Absolute: 0.6 10*3/uL (ref 0.1–1.0)
Monocytes Relative: 7 %
Neutro Abs: 6.3 10*3/uL (ref 1.7–7.7)
Neutrophils Relative %: 74 %
Platelets: 379 10*3/uL (ref 150–400)
RBC: 3.39 MIL/uL — ABNORMAL LOW (ref 3.87–5.11)
RDW: 18.4 % — ABNORMAL HIGH (ref 11.5–15.5)
WBC: 8.4 10*3/uL (ref 4.0–10.5)
nRBC: 0 % (ref 0.0–0.2)

## 2024-04-19 LAB — LACTATE DEHYDROGENASE: LDH: 224 U/L (ref 105–235)

## 2024-04-19 LAB — VITAMIN D 25 HYDROXY (VIT D DEFICIENCY, FRACTURES): Vit D, 25-Hydroxy: 20.9 ng/mL — ABNORMAL LOW (ref 30–100)

## 2024-04-19 LAB — FERRITIN: Ferritin: 156 ng/mL (ref 11–307)

## 2024-04-19 LAB — T4, FREE: Free T4: 0.97 ng/dL (ref 0.80–2.00)

## 2024-04-19 LAB — TSH: TSH: 1.3 u[IU]/mL (ref 0.350–4.500)

## 2024-04-19 LAB — VITAMIN B12: Vitamin B-12: 431 pg/mL (ref 180–914)

## 2024-04-19 LAB — MAGNESIUM: Magnesium: 1.7 mg/dL (ref 1.7–2.4)

## 2024-04-19 LAB — PHOSPHORUS: Phosphorus: 3.3 mg/dL (ref 2.5–4.6)

## 2024-04-19 LAB — FOLATE: Folate: 8.7 ng/mL

## 2024-04-19 MED ORDER — METHOCARBAMOL 750 MG PO TABS: 750.0000 mg | ORAL_TABLET | Freq: Four times a day (QID) | ORAL | 5 refills | Status: AC | PRN

## 2024-04-19 NOTE — Progress Notes (Signed)
 [facility] [address] [place], [place] [address]  Internal MEDICINE  Office Visit Note  Patient Name: Summer Murphy  [id number]  [id number]  Date of Service: 04/19/2024  Chief Complaint  Patient presents with   Follow-up    Unexplained wt loss     HPI Laycie presents for a follow-up visit for unexplained weight loss Patient is struggling to keep weight on. She will gain a small amount and then lose it. She is drinking additional nutritional shakes and eating protein rich foods but still struggles to gain weight and ends up losing more. This concern was also shared from her infectious disease specialist, Dr. Fayette.  She also needs surgery on her left knee but is unable to have the surgery due to the weight loss.  There is concern for possible causes of weight loss for this patient.  She does look cachetic today.   She has a lot of pain in the left knee, seeing pain management clinic. [facility] also is [facility]. She is requesting an increase in the methocarbamol  dose.   Current Medication: Outpatient Encounter Medications as of 04/19/2024  Medication Sig   Accu-Chek Softclix Lancets lancets USE TO TEST BLOOD SUGAR EVERY DAY AS NEEDED FOR type 2 diabetes   albuterol  (VENTOLIN  HFA) 108 (90 Base) MCG/ACT inhaler Inhale 2 puffs into the lungs every 6 (six) hours as needed for wheezing or shortness of breath.   alendronate  (FOSAMAX ) 70 MG tablet TAKE 1 TABLET BY MOUTH WEEKLY  TAKE WITH A FULL GLASS OF WATER   ON AN EMPTY STOMACH   apixaban  (ELIQUIS ) 5 MG TABS tablet Take 1 tablet (5 mg total) by mouth 2 (two) times daily.   Blood Glucose Monitoring Suppl (ACCU-CHEK GUIDE ME) w/Device KIT Use device to check glucose daily and as need for prediabetes R73.03   busPIRone  (BUSPAR ) 10 MG tablet Take 1 tablet (10 mg total) by mouth 2 (two) times daily.   cefadroxil  (DURICEF) 500 MG capsule TAKE 1 CAPSULE BY MOUTH TWICE DAILY *EMERGENCY  REFILL*   clonazePAM  (KLONOPIN ) 1 MG tablet Take 0.5-1 tablets (0.5-1 mg total) by mouth 2 (two) times daily as needed for anxiety.   COMBIVENT  RESPIMAT 20-100 MCG/ACT AERS respimat 1 puff every 6 (six) hours.   diclofenac  Sodium (VOLTAREN ) 1 % GEL Apply 4 g topically 4 (four) times daily.   DULoxetine  (CYMBALTA ) 60 MG capsule Take 1 capsule (60 mg total) by mouth daily.   Dupilumab  (DUPIXENT ) 300 MG/2ML SOAJ Inject 300 mg into the skin every 14 (fourteen) days.   fluticasone -salmeterol (WIXELA INHUB) 250-50 MCG/ACT AEPB Inhale 1 puff into the lungs in the morning and at bedtime.   gabapentin  (NEURONTIN ) 300 MG capsule Take 1 capsule (300 mg total) by mouth 2 (two) times daily.   glucose blood (ACCU-CHEK GUIDE TEST) test strip TEST BLOOD SUGAR ONCE DAILY AND AS NEEDED FOR type 2 diabetes   HYDROcodone -acetaminophen  (NORCO/VICODIN) 5-325 MG tablet Take 1 tablet by mouth every 8 (eight) hours as needed for severe pain (pain score 7-10).   [START ON 05/10/2024] HYDROcodone -acetaminophen  (NORCO/VICODIN) 5-325 MG tablet Take 1 tablet by mouth every 8 (eight) hours as needed for severe pain (pain score 7-10).   ipratropium-albuterol  (DUONEB) 0.5-2.5 (3) MG/3ML SOLN Take 3 mLs by nebulization every 6 (six) hours as needed.   lidocaine  (LIDODERM ) 5 % PLACE 1 PATCH ONTO THE AFFECTED AREA OF THE NECK DAILY, REMOVE AND DISCARD WITHIN 12 HOURS OR AS DIRECTED   methocarbamol  (ROBAXIN )  750 MG tablet Take 1-2 tablets (750-1,500 mg total) by mouth every 6 (six) hours as needed for muscle spasms.   montelukast  (SINGULAIR ) 10 MG tablet TAKE 1 TABLET(10 MG) BY MOUTH AT BEDTIME   ondansetron  (ZOFRAN ) 4 MG tablet TAKE 1 TABLET BY MOUTH TWICE DAILY AS NEEDED   oxybutynin  (DITROPAN -XL) 10 MG 24 hr tablet TAKE 1 TABLET BY MOUTH AT BEDTIME   pantoprazole  (PROTONIX ) 40 MG tablet Take 1 tablet (40 mg total) by mouth 2 (two) times daily.   polyethylene glycol (MIRALAX  / GLYCOLAX ) 17 g packet Take 17 g by mouth 2 (two) times  daily.   potassium chloride  (KLOR-CON  10) 10 MEQ tablet Take 1 tablet (10 mEq total) by mouth daily.   rOPINIRole  (REQUIP ) 1 MG tablet TAKE 1 TABLET(1 MG) BY MOUTH AT BEDTIME   SUMAtriptan  (IMITREX ) 100 MG tablet TAKE 1 TABLET BY MOUTH EVERY 2 HOURS AS NEEDED (FOR MIGRAINE HEADACHES). MAY REPEAT IN 1 HOUR IF HEADACHE PERSISTS   traZODone  (DESYREL ) 150 MG tablet TAKE 1 TABLET(150 MG) BY MOUTH AT BEDTIME   [DISCONTINUED] methocarbamol  (ROBAXIN ) 750 MG tablet TAKE 1 TABLET BY MOUTH EVERY 8 HOURS AS NEEDED FOR MUSCLE SPASMS   No facility-administered encounter medications on file as of 04/19/2024.    Surgical History: Past Surgical History:  Procedure Laterality Date   ABDOMINAL HYSTERECTOMY     AMPUTATION TOE Left 07/31/2016   Procedure: AMPUTATION TOE/MPJ 2nd toe;  Surgeon: Neill Boas, DPM;  Location: [facility];  Service: Podiatry;  Laterality: Left;   APPENDECTOMY  1990   BACK SURGERY     low back   BREAST SURGERY     bilateral breast reduction   CARDIAC ELECTROPHYSIOLOGY STUDY AND ABLATION     CERVICAL WOUND DEBRIDEMENT N/A 04/17/2023   Procedure: CERVICAL WOUND DEBRIDEMENT;  Surgeon: Bluford Standing, MD;  Location: [facility];  Service: Neurosurgery;  Laterality: N/A;   CHOLECYSTECTOMY  1990   COLONOSCOPY WITH PROPOFOL  N/A 01/04/2017   Procedure: COLONOSCOPY WITH PROPOFOL ;  Surgeon: Viktoria Lamar DASEN, MD;  Location: [facility];  Service: Endoscopy;  Laterality: N/A;   CORNEAL TRANSPLANT     ESOPHAGOGASTRODUODENOSCOPY N/A 04/09/2021   Procedure: ESOPHAGOGASTRODUODENOSCOPY (EGD);  Surgeon: Unk Corinn Skiff, MD;  Location: [facility] ENDOSCOPY;  Service: Gastroenterology;  Laterality: N/A;   ESOPHAGOGASTRODUODENOSCOPY (EGD) WITH PROPOFOL  N/A 01/04/2017   Procedure: ESOPHAGOGASTRODUODENOSCOPY (EGD) WITH PROPOFOL ;  Surgeon: Viktoria Lamar DASEN, MD;  Location: [facility] ENDOSCOPY;  Service: Endoscopy;  Laterality: N/A;   EXCISION BONE CYST Left 07/31/2016   Procedure: EXCISION BONE CYST/exostectomy  28124/left 2nd;  Surgeon: Neill Boas, DPM;  Location: [facility];  Service: Podiatry;  Laterality: Left;   EXTRACORPOREAL SHOCK WAVE LITHOTRIPSY Left 09/12/2015   Procedure: EXTRACORPOREAL SHOCK WAVE LITHOTRIPSY (ESWL);  Surgeon: Rosina Riis, MD;  Location: [facility];  Service: Urology;  Laterality: Left;   FRACTURE SURGERY     left foot   GUM SURGERY Left    gum infection   HARDWARE REMOVAL Left 11/14/2020   Procedure: HARDWARE REMOVAL;  Surgeon: Kathlynn Sharper, MD;  Location: [facility];  Service: Orthopedics;  Laterality: Left;   HH repair     Fundoplication   JOINT REPLACEMENT Bilateral 2013,2014   total knees   LAPAROSCOPIC HYSTERECTOMY     LAPAROTOMY N/A 11/27/2023   Procedure: LAPAROTOMY, EXPLORATORY lysis of adhesions;  Surgeon: Lane Shope, MD;  Location: [facility];  Service: General;  Laterality: N/A;   LITHOTRIPSY     periprosthetic supracondylar fracture of left femur  02/16/2020   [facility]  POSTERIOR CERVICAL FUSION/FORAMINOTOMY N/A 03/02/2023   Procedure: C3-6 POSTERIOR CERVICAL LAMINECTOMY AND FUSION;  Surgeon: Claudene Penne ORN, MD;  Location: [facility];  Service: Neurosurgery;  Laterality: N/A;   TONSILLECTOMY     TOTAL KNEE REVISION Right 11/14/2019   Procedure: Revision patella and tibial polyethylene;  Surgeon: Kathlynn Sharper, MD;  Location: [facility];  Service: Orthopedics;  Laterality: Right;   URETEROSCOPY      Medical History: Past Medical History:  Diagnosis Date   Acid reflux    Anemia    Anxiety    a.) buspirone  BID + BZO PRN (clonazepam )   Arthritis    Aspiration pneumonia (HCC) 06/02/2022   Asthma    CAP (community acquired pneumonia) 04/24/2022   Cervical disc disorder with radiculopathy of cervical region    Chronic pain syndrome 05/25/2023   a.) on COT as prescribed by pain mangement   Chronic, continuous use of opioids    a.) followed by pain mangement   COPD (chronic obstructive pulmonary disease) (HCC)    DOE (dyspnea on exertion)     Hematuria    Hiatal hernia    History of kidney stones    Hypertension    Hypertensive urgency 05/12/2022   Hypoglycemia    Insomnia    a.) uses Trazodone  PRN   LBBB (left bundle branch block)    Migraine    MSSA bacteremia 04/19/2023   Murmur    Osteopenia    Palpitations    Pre-diabetes    Restless leg    a.) on ropinirole    Sepsis (HCC) 05/12/2022   Stenosis of cervical spine with myelopathy (HCC)    T2DM (type 2 diabetes mellitus) (HCC)     Family History: Family History  Problem Relation Age of Onset   Hypertension Mother    Stroke Mother    Prostate cancer Father    Kidney Stones Father    Diabetes Brother    Hypertension Brother    Breast cancer Maternal Aunt    Kidney disease Neg Hx     Social History   Socioeconomic History   Marital status: Widowed    Spouse name: Not on file   Number of children: Not on file   Years of education: Not on file   Highest education level: Not on file  Occupational History   Not on file  Tobacco Use   Smoking status: Never    Passive exposure: Past   Smokeless tobacco: Never  Vaping Use   Vaping status: Never Used  Substance and Sexual Activity   Alcohol use: No   Drug use: No   Sexual activity: Not Currently  Other Topics Concern   Not on file  Social History Narrative   Lives at home alone. Relatives are the support person.    Social Drivers of Health   Tobacco Use: Low Risk (04/19/2024)   Patient History    Smoking Tobacco Use: Never    Smokeless Tobacco Use: Never    Passive Exposure: Past  Financial Resource Strain: Low Risk  (04/12/2024)   Received from [facility]   Overall Financial Resource Strain (CARDIA)    Difficulty of Paying Living Expenses: Not very hard  Food Insecurity: Food Insecurity Present (04/12/2024)   Received from [facility]   Epic    Within the past 12 months, you worried that your food would run out before you got the money to buy more.:  Sometimes true    Within the past 12 months,  the food you bought just didn't last and you didn't have money to get more.: Never true  Transportation Needs: No Transportation Needs (04/12/2024)   Received from [facility]    In the past 12 months, has lack of transportation kept you from medical appointments or from getting medications?: No    Lack of Transportation (Non-Medical): No  Physical Activity: Not on file  Stress: Not on file  Social Connections: Moderately Integrated (11/26/2023)   Social Connection and Isolation Panel    Frequency of Communication with Friends and Family: More than three times a week    Frequency of Social Gatherings with Friends and Family: More than three times a week    Attends Religious Services: 1 to 4 times per year    Active Member of [employer] or Organizations: Yes    Attends Banker Meetings: 1 to 4 times per year    Marital Status: Widowed  Intimate Partner Violence: Not At Risk (11/26/2023)   Epic    Fear of Current or Ex-Partner: No    Emotionally Abused: No    Physically Abused: No    Sexually Abused: No  Depression (PHQ2-9): Low Risk (03/21/2024)   Depression (PHQ2-9)    PHQ-2 Score: 0  Alcohol Screen: Low Risk (12/31/2021)   Alcohol Screen    Last Alcohol Screening Score (AUDIT): 0  Housing: Low Risk  (04/12/2024)   Received from [facility]   Epic    In the last 12 months, was there a time when you were not able to pay the mortgage or rent on time?: No    In the past 12 months, how many times have you moved where you were living?: 0    At any time in the past 12 months, were you homeless or living in a shelter (including now)?: No  Utilities: Not At Risk (04/12/2024)   Received from [facility]   Epic    In the past 12 months has the electric, gas, oil, or water  company threatened to shut off services in your home?: No  Health Literacy: Not on file       Review of Systems  Vital Signs: BP 132/68   Pulse (!) 114   Temp (!) 97.2 F (36.2 C)   Resp 16   Ht 4' 11.5 (1.511 m)   Wt 102 lb 3.2 oz (46.4 kg)   SpO2 94%   BMI 20.30 kg/m    Physical Exam     Assessment/Plan: 1. Unexplained weight loss (Primary) *** - CBC with Differential/Platelet - CMP14+EGFR - CBC with Differential/Platelet - CMP14+EGFR - TSH+T4F+T3Free+ThyAbs+TPO+VD25 - B12 and Folate Panel - Iron , TIBC and Ferritin Panel - Serum protein electrophoresis with reflex - ANA Direct w/Reflex if Positive - Lactate Dehydrogenase (LDH) - Magnesium  - Parathyroid  hormone, intact (no Ca) - PTH-Related Peptide - Phosphorus - Cancer antigen 19-9  2. Iron  deficiency anemia, unspecified iron  deficiency anemia type *** - CBC with Differential/Platelet - CMP14+EGFR - CBC with Differential/Platelet - CMP14+EGFR - TSH+T4F+T3Free+ThyAbs+TPO+VD25 - B12 and Folate Panel - Iron , TIBC and Ferritin Panel - Serum protein electrophoresis with reflex - ANA Direct w/Reflex if Positive - Lactate Dehydrogenase (LDH) - Magnesium  - Parathyroid  hormone, intact (no Ca) - PTH-Related Peptide - Phosphorus - Cancer antigen 19-9  3. Type 2 diabetes mellitus with other specified complication, without long-term current use of insulin  (HCC) *** - CBC with Differential/Platelet - CMP14+EGFR - CBC with Differential/Platelet - CMP14+EGFR -  TSH+T4F+T3Free+ThyAbs+TPO+VD25 - B12 and Folate Panel - Iron , TIBC and Ferritin Panel - Serum protein electrophoresis with reflex - ANA Direct w/Reflex if Positive - Lactate Dehydrogenase (LDH) - Magnesium  - Parathyroid  hormone, intact (no Ca) - PTH-Related Peptide - Phosphorus - Cancer antigen 19-9  4. Hypocalcemia *** - CBC with Differential/Platelet - CMP14+EGFR - CBC with Differential/Platelet - CMP14+EGFR - TSH+T4F+T3Free+ThyAbs+TPO+VD25 - B12 and Folate Panel - Iron , TIBC and Ferritin Panel - Serum protein  electrophoresis with reflex - ANA Direct w/Reflex if Positive - Lactate Dehydrogenase (LDH) - Magnesium  - Parathyroid  hormone, intact (no Ca) - PTH-Related Peptide - Phosphorus - Cancer antigen 19-9  5. Hypokalemia *** - CBC with Differential/Platelet - CMP14+EGFR - CBC with Differential/Platelet - CMP14+EGFR - TSH+T4F+T3Free+ThyAbs+TPO+VD25 - B12 and Folate Panel - Iron , TIBC and Ferritin Panel - Serum protein electrophoresis with reflex - ANA Direct w/Reflex if Positive - Lactate Dehydrogenase (LDH) - Magnesium  - Parathyroid  hormone, intact (no Ca) - PTH-Related Peptide - Phosphorus - Cancer antigen 19-9  6. Cervical radicular pain (left severe) *** - methocarbamol  (ROBAXIN ) 750 MG tablet; Take 1-2 tablets (750-1,500 mg total) by mouth every 6 (six) hours as needed for muscle spasms.  Dispense: 240 tablet; Refill: 5  7. Spinal stenosis in cervical region *** - methocarbamol  (ROBAXIN ) 750 MG tablet; Take 1-2 tablets (750-1,500 mg total) by mouth every 6 (six) hours as needed for muscle spasms.  Dispense: 240 tablet; Refill: 5  8. Pain and swelling of knee, left *** - methocarbamol  (ROBAXIN ) 750 MG tablet; Take 1-2 tablets (750-1,500 mg total) by mouth every 6 (six) hours as needed for muscle spasms.  Dispense: 240 tablet; Refill: 5   General Counseling: Talah verbalizes understanding of the findings of todays visit and agrees with plan of treatment. I have discussed any further diagnostic evaluation that may be needed or ordered today. We also reviewed her medications today. she has been encouraged to call the office with any questions or concerns that should arise related to todays visit.    Orders Placed This Encounter  Procedures   CBC with Differential/Platelet   CMP14+EGFR   CBC with Differential/Platelet   CMP14+EGFR   TSH+T4F+T3Free+ThyAbs+TPO+VD25   B12 and Folate Panel   Iron , TIBC and Ferritin Panel   Serum protein electrophoresis with reflex   ANA  Direct w/Reflex if Positive   Lactate Dehydrogenase (LDH)   Magnesium    Parathyroid  hormone, intact (no Ca)   PTH-Related Peptide   Phosphorus   Cancer antigen 19-9    Meds ordered this encounter  Medications   methocarbamol  (ROBAXIN ) 750 MG tablet    Sig: Take 1-2 tablets (750-1,500 mg total) by mouth every 6 (six) hours as needed for muscle spasms.    Dispense:  240 tablet    Refill:  5    Note increase dose and frequency, fill new script today, thanks    Return for F/U, Labs, Sharese Manrique PCP mostlikely virtual visit due to upcoming icy and snowy weather. .   Total time spent:30 Minutes Time spent includes review of chart, medications, test results, and follow up plan with the patient.   [place] Controlled Substance Database was reviewed by me.  This patient was seen by Mardy Maxin, FNP-C in collaboration with Dr. Sigrid Bathe as a part of collaborative care agreement.   Dorann Davidson R. Maxin, MSN, FNP-C Internal medicine

## 2024-04-20 ENCOUNTER — Other Ambulatory Visit: Payer: Self-pay | Admitting: Nurse Practitioner

## 2024-04-20 ENCOUNTER — Encounter: Payer: Self-pay | Admitting: Nurse Practitioner

## 2024-04-20 ENCOUNTER — Ambulatory Visit: Payer: Self-pay

## 2024-04-20 DIAGNOSIS — M25462 Effusion, left knee: Secondary | ICD-10-CM

## 2024-04-20 DIAGNOSIS — M5412 Radiculopathy, cervical region: Secondary | ICD-10-CM

## 2024-04-20 DIAGNOSIS — M4802 Spinal stenosis, cervical region: Secondary | ICD-10-CM

## 2024-04-20 LAB — ENA+DNA/DS+ANTICH+CENTRO+JO...
Anti JO-1: <0.2 AI (ref 0.0–0.9)
Centromere Ab Screen: >8 AI — ABNORMAL HIGH (ref 0.0–0.9)
Chromatin Ab SerPl-aCnc: <0.2 AI (ref 0.0–0.9)
ENA SM Ab Ser-aCnc: <0.2 AI (ref 0.0–0.9)
Ribonucleic Protein: <0.2 AI (ref 0.0–0.9)
SSA (Ro) (ENA) Antibody, IgG: <0.2 AI (ref 0.0–0.9)
SSB (La) (ENA) Antibody, IgG: <0.2 AI (ref 0.0–0.9)
Scleroderma (Scl-70) (ENA) Antibody, IgG: <0.2 AI (ref 0.0–0.9)
ds DNA Ab: <1 [IU]/mL (ref 0–9)

## 2024-04-20 LAB — PARATHYROID HORMONE, INTACT (NO CA): PTH: 19 pg/mL (ref 15–65)

## 2024-04-20 LAB — THYROID ANTIBODIES (THYROPEROXIDASE & THYROGLOBULIN)
Thyroglobulin Antibody: <1 [IU]/mL (ref 0.0–0.9)
Thyroperoxidase Ab SerPl-aCnc: <9 [IU]/mL (ref 0–34)

## 2024-04-20 LAB — CA 19-9 (SERIAL): CA 19-9: 16 U/mL (ref 0–35)

## 2024-04-20 LAB — ANA W/REFLEX IF POSITIVE: Anti Nuclear Antibody (ANA): POSITIVE — AB

## 2024-04-21 LAB — PROTEIN ELECTROPHORESIS, SERUM
A/G Ratio: 0.9 (ref 0.7–1.7)
Albumin ELP: 2.8 g/dL — ABNORMAL LOW (ref 2.9–4.4)
Alpha-1-Globulin: 0.3 g/dL (ref 0.0–0.4)
Alpha-2-Globulin: 0.8 g/dL (ref 0.4–1.0)
Beta Globulin: 1.1 g/dL (ref 0.7–1.3)
Gamma Globulin: 0.9 g/dL (ref 0.4–1.8)
Globulin, Total: 3.1 g/dL (ref 2.2–3.9)
Total Protein ELP: 5.9 g/dL — ABNORMAL LOW (ref 6.0–8.5)

## 2024-04-25 ENCOUNTER — Other Ambulatory Visit: Payer: Self-pay | Admitting: Nurse Practitioner

## 2024-04-25 DIAGNOSIS — R3981 Functional urinary incontinence: Secondary | ICD-10-CM

## 2024-04-26 ENCOUNTER — Telehealth: Payer: Self-pay

## 2024-04-26 NOTE — Telephone Encounter (Signed)
 Pt advised that Summer Murphy waiting for 1 more result its process once we receive  she will review and we will call her

## 2024-04-27 ENCOUNTER — Telehealth (INDEPENDENT_AMBULATORY_CARE_PROVIDER_SITE_OTHER): Admitting: Nurse Practitioner

## 2024-04-27 ENCOUNTER — Encounter: Payer: Self-pay | Admitting: Nurse Practitioner

## 2024-04-27 ENCOUNTER — Telehealth: Payer: Self-pay | Admitting: Nurse Practitioner

## 2024-04-27 VITALS — Resp 16 | Ht 59.5 in | Wt 102.0 lb

## 2024-04-27 DIAGNOSIS — R634 Abnormal weight loss: Secondary | ICD-10-CM | POA: Diagnosis not present

## 2024-04-27 DIAGNOSIS — D509 Iron deficiency anemia, unspecified: Secondary | ICD-10-CM | POA: Diagnosis not present

## 2024-04-27 DIAGNOSIS — M4802 Spinal stenosis, cervical region: Secondary | ICD-10-CM | POA: Diagnosis not present

## 2024-04-27 DIAGNOSIS — M25562 Pain in left knee: Secondary | ICD-10-CM

## 2024-04-27 DIAGNOSIS — M25462 Effusion, left knee: Secondary | ICD-10-CM

## 2024-04-27 DIAGNOSIS — R7689 Other specified abnormal immunological findings in serum: Secondary | ICD-10-CM | POA: Diagnosis not present

## 2024-04-27 DIAGNOSIS — M5412 Radiculopathy, cervical region: Secondary | ICD-10-CM | POA: Diagnosis not present

## 2024-04-27 LAB — PTH-RELATED PEPTIDE: PTH-related peptide: <2 pmol/L

## 2024-04-27 NOTE — Progress Notes (Cosign Needed)
 [facility] [address] [place], [place] [address]  Internal MEDICINE  Telephone Visit  Patient Name: Summer Murphy  [id number]  [id number]  Date of Service: 04/27/2024  I connected with the patient at 1205 by telephone and verified the patients identity using two identifiers.   I discussed the limitations, risks, security and privacy concerns of performing an evaluation and management service by telephone and the availability of in person appointments. I also discussed with the patient that there may be a patient responsible charge related to the service.  The patient expressed understanding and agrees to proceed.    Chief Complaint  Patient presents with   Telephone Screen    Review labs   Telephone Assessment    HPI Summer Murphy presents for a telehealth virtual visit for lab results.  Positive ANA Elevated centromere antibody Iron  deficiency anemia -- hgb down to 7.8 Hypocalcemia 8.7 Low protein, low albumin Low vitamin D  20.9 B12 431 Low serum iron  11, normal ferritin level 156.    Current Medication: Outpatient Encounter Medications as of 04/27/2024  Medication Sig   Accu-Chek Softclix Lancets lancets USE TO TEST BLOOD SUGAR EVERY DAY AS NEEDED FOR type 2 diabetes   albuterol  (VENTOLIN  HFA) 108 (90 Base) MCG/ACT inhaler Inhale 2 puffs into the lungs every 6 (six) hours as needed for wheezing or shortness of breath.   alendronate  (FOSAMAX ) 70 MG tablet TAKE 1 TABLET BY MOUTH WEEKLY  TAKE WITH A FULL GLASS OF WATER   ON AN EMPTY STOMACH   apixaban  (ELIQUIS ) 5 MG TABS tablet Take 1 tablet (5 mg total) by mouth 2 (two) times daily.   Blood Glucose Monitoring Suppl (ACCU-CHEK GUIDE ME) w/Device KIT Use device to check glucose daily and as need for prediabetes R73.03   busPIRone  (BUSPAR ) 10 MG tablet Take 1 tablet (10 mg total) by mouth 2 (two) times daily.   cefadroxil  (DURICEF) 500 MG capsule TAKE 1 CAPSULE BY MOUTH TWICE DAILY *EMERGENCY REFILL*   clonazePAM   (KLONOPIN ) 1 MG tablet Take 0.5-1 tablets (0.5-1 mg total) by mouth 2 (two) times daily as needed for anxiety.   COMBIVENT  RESPIMAT 20-100 MCG/ACT AERS respimat 1 puff every 6 (six) hours.   diclofenac  Sodium (VOLTAREN ) 1 % GEL Apply 4 g topically 4 (four) times daily.   DULoxetine  (CYMBALTA ) 60 MG capsule Take 1 capsule (60 mg total) by mouth daily.   Dupilumab  (DUPIXENT ) 300 MG/2ML SOAJ Inject 300 mg into the skin every 14 (fourteen) days.   fluticasone -salmeterol (WIXELA INHUB) 250-50 MCG/ACT AEPB Inhale 1 puff into the lungs in the morning and at bedtime.   gabapentin  (NEURONTIN ) 300 MG capsule Take 1 capsule (300 mg total) by mouth 2 (two) times daily.   glucose blood (ACCU-CHEK GUIDE TEST) test strip TEST BLOOD SUGAR ONCE DAILY AND AS NEEDED FOR type 2 diabetes   HYDROcodone -acetaminophen  (NORCO/VICODIN) 5-325 MG tablet Take 1 tablet by mouth every 8 (eight) hours as needed for severe pain (pain score 7-10).   [START ON 05/10/2024] HYDROcodone -acetaminophen  (NORCO/VICODIN) 5-325 MG tablet Take 1 tablet by mouth every 8 (eight) hours as needed for severe pain (pain score 7-10).   ipratropium-albuterol  (DUONEB) 0.5-2.5 (3) MG/3ML SOLN Take 3 mLs by nebulization every 6 (six) hours as needed.   lidocaine  (LIDODERM ) 5 % PLACE 1 PATCH ONTO THE AFFECTED AREA OF THE NECK DAILY, REMOVE AND DISCARD WITHIN 12 HOURS OR AS DIRECTED   methocarbamol  (ROBAXIN ) 750 MG tablet Take 1-2 tablets (750-1,500 mg total) by mouth every 6 (six) hours  as needed for muscle spasms.   montelukast  (SINGULAIR ) 10 MG tablet TAKE 1 TABLET(10 MG) BY MOUTH AT BEDTIME   ondansetron  (ZOFRAN ) 4 MG tablet TAKE 1 TABLET BY MOUTH TWICE DAILY AS NEEDED   oxybutynin  (DITROPAN -XL) 10 MG 24 hr tablet TAKE 1 TABLET BY MOUTH AT BEDTIME   pantoprazole  (PROTONIX ) 40 MG tablet Take 1 tablet (40 mg total) by mouth 2 (two) times daily.   polyethylene glycol (MIRALAX  / GLYCOLAX ) 17 g packet Take 17 g by mouth 2 (two) times daily.   potassium  chloride (KLOR-CON  10) 10 MEQ tablet Take 1 tablet (10 mEq total) by mouth daily.   rOPINIRole  (REQUIP ) 1 MG tablet TAKE 1 TABLET(1 MG) BY MOUTH AT BEDTIME   SUMAtriptan  (IMITREX ) 100 MG tablet TAKE 1 TABLET BY MOUTH EVERY 2 HOURS AS NEEDED (FOR MIGRAINE HEADACHES). MAY REPEAT IN 1 HOUR IF HEADACHE PERSISTS   traZODone  (DESYREL ) 150 MG tablet TAKE 1 TABLET(150 MG) BY MOUTH AT BEDTIME   No facility-administered encounter medications on file as of 04/27/2024.    Surgical History: Past Surgical History:  Procedure Laterality Date   ABDOMINAL HYSTERECTOMY     AMPUTATION TOE Left 07/31/2016   Procedure: AMPUTATION TOE/MPJ 2nd toe;  Surgeon: Neill Boas, DPM;  Location: [facility]S;  Service: Podiatry;  Laterality: Left;   APPENDECTOMY  1990   BACK SURGERY     low back   BREAST SURGERY     bilateral breast reduction   CARDIAC ELECTROPHYSIOLOGY STUDY AND ABLATION     CERVICAL WOUND DEBRIDEMENT N/A 04/17/2023   Procedure: CERVICAL WOUND DEBRIDEMENT;  Surgeon: Bluford Standing, MD;  Location: [facility];  Service: Neurosurgery;  Laterality: N/A;   CHOLECYSTECTOMY  1990   COLONOSCOPY WITH PROPOFOL  N/A 01/04/2017   Procedure: COLONOSCOPY WITH PROPOFOL ;  Surgeon: Viktoria Lamar DASEN, MD;  Location: [facility] ENDOSCOPY;  Service: Endoscopy;  Laterality: N/A;   CORNEAL TRANSPLANT     ESOPHAGOGASTRODUODENOSCOPY N/A 04/09/2021   Procedure: ESOPHAGOGASTRODUODENOSCOPY (EGD);  Surgeon: Unk Corinn Skiff, MD;  Location: [facility];  Service: Gastroenterology;  Laterality: N/A;   ESOPHAGOGASTRODUODENOSCOPY (EGD) WITH PROPOFOL  N/A 01/04/2017   Procedure: ESOPHAGOGASTRODUODENOSCOPY (EGD) WITH PROPOFOL ;  Surgeon: Viktoria Lamar DASEN, MD;  Location: [facility] ENDOSCOPY;  Service: Endoscopy;  Laterality: N/A;   EXCISION BONE CYST Left 07/31/2016   Procedure: EXCISION BONE CYST/exostectomy 28124/left 2nd;  Surgeon: Neill Boas, DPM;  Location: [facility];  Service: Podiatry;  Laterality: Left;   EXTRACORPOREAL SHOCK WAVE LITHOTRIPSY  Left 09/12/2015   Procedure: EXTRACORPOREAL SHOCK WAVE LITHOTRIPSY (ESWL);  Surgeon: Rosina Riis, MD;  Location: [facility];  Service: Urology;  Laterality: Left;   FRACTURE SURGERY     left foot   GUM SURGERY Left    gum infection   HARDWARE REMOVAL Left 11/14/2020   Procedure: HARDWARE REMOVAL;  Surgeon: Kathlynn Sharper, MD;  Location: [facility];  Service: Orthopedics;  Laterality: Left;   HH repair     Fundoplication   JOINT REPLACEMENT Bilateral 2013,2014   total knees   LAPAROSCOPIC HYSTERECTOMY     LAPAROTOMY N/A 11/27/2023   Procedure: LAPAROTOMY, EXPLORATORY lysis of adhesions;  Surgeon: Lane Shope, MD;  Location: [facility];  Service: General;  Laterality: N/A;   LITHOTRIPSY     periprosthetic supracondylar fracture of left femur  02/16/2020   [facility]   POSTERIOR CERVICAL FUSION/FORAMINOTOMY N/A 03/02/2023   Procedure: C3-6 POSTERIOR CERVICAL LAMINECTOMY AND FUSION;  Surgeon: Claudene Riis ORN, MD;  Location: [facility];  Service: Neurosurgery;  Laterality: N/A;   TONSILLECTOMY  TOTAL KNEE REVISION Right 11/14/2019   Procedure: Revision patella and tibial polyethylene;  Surgeon: Kathlynn Sharper, MD;  Location: [facility];  Service: Orthopedics;  Laterality: Right;   URETEROSCOPY      Medical History: Past Medical History:  Diagnosis Date   Acid reflux    Anemia    Anxiety    a.) buspirone  BID + BZO PRN (clonazepam )   Arthritis    Aspiration pneumonia (HCC) 06/02/2022   Asthma    CAP (community acquired pneumonia) 04/24/2022   Cervical disc disorder with radiculopathy of cervical region    Chronic pain syndrome 05/25/2023   a.) on COT as prescribed by pain mangement   Chronic, continuous use of opioids    a.) followed by pain mangement   COPD (chronic obstructive pulmonary disease) (HCC)    DOE (dyspnea on exertion)    Hematuria    Hiatal hernia    History of kidney stones    Hypertension    Hypertensive urgency 05/12/2022   Hypoglycemia    Insomnia    a.)  uses Trazodone  PRN   LBBB (left bundle branch block)    Migraine    MSSA bacteremia 04/19/2023   Murmur    Osteopenia    Palpitations    Pre-diabetes    Restless leg    a.) on ropinirole    Sepsis (HCC) 05/12/2022   Stenosis of cervical spine with myelopathy (HCC)    T2DM (type 2 diabetes mellitus) (HCC)     Family History: Family History  Problem Relation Age of Onset   Hypertension Mother    Stroke Mother    Prostate cancer Father    Kidney Stones Father    Diabetes Brother    Hypertension Brother    Breast cancer Maternal Aunt    Kidney disease Neg Hx     Social History   Socioeconomic History   Marital status: Widowed    Spouse name: Not on file   Number of children: Not on file   Years of education: Not on file   Highest education level: Not on file  Occupational History   Not on file  Tobacco Use   Smoking status: Never    Passive exposure: Past   Smokeless tobacco: Never  Vaping Use   Vaping status: Never Used  Substance and Sexual Activity   Alcohol use: No   Drug use: No   Sexual activity: Not Currently  Other Topics Concern   Not on file  Social History Narrative   Lives at home alone. Relatives are the support person.    Social Drivers of Health   Tobacco Use: Low Risk (04/27/2024)   Patient History    Smoking Tobacco Use: Never    Smokeless Tobacco Use: Never    Passive Exposure: Past  Financial Resource Strain: Low Risk  (04/12/2024)   Received from [facility]   Overall Financial Resource Strain (CARDIA)    Difficulty of Paying Living Expenses: Not very hard  Food Insecurity: Food Insecurity Present (04/12/2024)   Received from [facility]   Epic    Within the past 12 months, you worried that your food would run out before you got the money to buy more.: Sometimes true    Within the past 12 months, the food you bought just didn't last and you didn't have money to get more.: Never true  Transportation  Needs: No Transportation Needs (04/12/2024)   Received from [facility]   [facility] - Transportation  In the past 12 months, has lack of transportation kept you from medical appointments or from getting medications?: No    Lack of Transportation (Non-Medical): No  Physical Activity: Not on file  Stress: Not on file  Social Connections: Moderately Integrated (11/26/2023)   Social Connection and Isolation Panel    Frequency of Communication with Friends and Family: More than three times a week    Frequency of Social Gatherings with Friends and Family: More than three times a week    Attends Religious Services: 1 to 4 times per year    Active Member of [employer] or Organizations: Yes    Attends Banker Meetings: 1 to 4 times per year    Marital Status: Widowed  Intimate Partner Violence: Not At Risk (11/26/2023)   Epic    Fear of Current or Ex-Partner: No    Emotionally Abused: No    Physically Abused: No    Sexually Abused: No  Depression (PHQ2-9): Low Risk (03/21/2024)   Depression (PHQ2-9)    PHQ-2 Score: 0  Alcohol Screen: Low Risk (12/31/2021)   Alcohol Screen    Last Alcohol Screening Score (AUDIT): 0  Housing: Low Risk  (04/12/2024)   Received from [facility]   Epic    In the last 12 months, was there a time when you were not able to pay the mortgage or rent on time?: No    In the past 12 months, how many times have you moved where you were living?: 0    At any time in the past 12 months, were you homeless or living in a shelter (including now)?: No  Utilities: Not At Risk (04/12/2024)   Received from [facility]   Epic    In the past 12 months has the electric, gas, oil, or water  company threatened to shut off services in your home?: No  Health Literacy: Not on file      Review of Systems  Constitutional:  Positive for appetite change, fatigue and unexpected weight change.  Respiratory:  Positive for shortness  of breath. Negative for cough, chest tightness and wheezing.   Cardiovascular: Negative.  Negative for chest pain and palpitations.  Gastrointestinal: Negative.   Musculoskeletal:  Positive for arthralgias, gait problem, joint swelling and myalgias.  Neurological:  Positive for dizziness, weakness and light-headedness.  Psychiatric/Behavioral:  Positive for sleep disturbance. Negative for self-injury and suicidal ideas. The patient is nervous/anxious.     Vital Signs: Resp 16   Ht 4' 11.5 (1.511 m)   Wt 102 lb (46.3 kg)   BMI 20.26 kg/m    Observation/Objective: She is alert and oriented. No acute distress noted.     Assessment/Plan: 1. Positive ANA (antinuclear antibody) (Primary) Referred to rheumatology for further evaluation - Ambulatory referral to Rheumatology  2. Centromere antibody positive Referred to rheumatology for further evaluation - Ambulatory referral to Rheumatology  3. Cervical radicular pain (left severe) Referred to rheumatology for further evaluation - Ambulatory referral to Rheumatology  4. Spinal stenosis in cervical region Referred to rheumatology for further evaluation - Ambulatory referral to Rheumatology  5. Pain and swelling of knee, left Referred to rheumatology for further evaluation - Ambulatory referral to Rheumatology  6. Unexplained weight loss Referred to rheumatology for further evaluation - Ambulatory referral to Rheumatology  7. Iron  deficiency anemia, unspecified iron  deficiency anemia type Referred to rheumatology for further evaluation - Ambulatory referral to Rheumatology   General Counseling: Prosperity verbalizes understanding of the findings  of today's phone visit and agrees with plan of treatment. I have discussed any further diagnostic evaluation that may be needed or ordered today. We also reviewed her medications today. she has been encouraged to call the office with any questions or concerns that should arise related to  todays visit.  No follow-ups on file.   Orders Placed This Encounter  Procedures   Ambulatory referral to Rheumatology    No orders of the defined types were placed in this encounter.   Time spent:30 Minutes Time spent with patient included reviewing progress notes, labs, imaging studies, and discussing plan for follow up.  [place] Controlled Substance Database was reviewed by me for overdose risk score (ORS) if appropriate.  This patient was seen by Mardy Maxin, FNP-C in collaboration with Dr. Sigrid Bathe as a part of collaborative care agreement.  Aviv Rota R. Maxin, MSN, FNP-C Internal medicine

## 2024-04-27 NOTE — Telephone Encounter (Signed)
 Urgent Rheumatology referral sent via [employer] to Dr. Tobie w/ [facility]. Notified patient. Gave patient telephone # [phone]

## 2024-05-09 ENCOUNTER — Encounter: Admitting: Nurse Practitioner

## 2024-05-11 ENCOUNTER — Ambulatory Visit

## 2024-05-18 ENCOUNTER — Ambulatory Visit: Admitting: Cardiology

## 2024-05-18 ENCOUNTER — Ambulatory Visit: Admitting: Infectious Diseases

## 2024-10-05 ENCOUNTER — Ambulatory Visit: Admitting: Infectious Diseases

## 2024-10-31 ENCOUNTER — Ambulatory Visit: Admitting: Nurse Practitioner
